# Patient Record
Sex: Male | Born: 1950 | Race: White | Hispanic: No | Marital: Married | Smoking: Former smoker
Health system: Southern US, Community
[De-identification: ages and names within clinical notes are randomized; demographics above are authoritative.]

## PROBLEM LIST (undated history)

## (undated) DIAGNOSIS — J189 Pneumonia, unspecified organism: Secondary | ICD-10-CM

## (undated) DIAGNOSIS — G8194 Hemiplegia, unspecified affecting left nondominant side: Secondary | ICD-10-CM

## (undated) DIAGNOSIS — F419 Anxiety disorder, unspecified: Secondary | ICD-10-CM

## (undated) DIAGNOSIS — F32A Depression, unspecified: Secondary | ICD-10-CM

## (undated) DIAGNOSIS — Z9989 Dependence on other enabling machines and devices: Secondary | ICD-10-CM

## (undated) DIAGNOSIS — K254 Chronic or unspecified gastric ulcer with hemorrhage: Secondary | ICD-10-CM

## (undated) DIAGNOSIS — K648 Other hemorrhoids: Secondary | ICD-10-CM

## (undated) DIAGNOSIS — E669 Obesity, unspecified: Secondary | ICD-10-CM

## (undated) DIAGNOSIS — K5790 Diverticulosis of intestine, part unspecified, without perforation or abscess without bleeding: Secondary | ICD-10-CM

## (undated) DIAGNOSIS — Z8679 Personal history of other diseases of the circulatory system: Secondary | ICD-10-CM

## (undated) DIAGNOSIS — E785 Hyperlipidemia, unspecified: Secondary | ICD-10-CM

## (undated) DIAGNOSIS — K219 Gastro-esophageal reflux disease without esophagitis: Secondary | ICD-10-CM

## (undated) DIAGNOSIS — R51 Headache: Secondary | ICD-10-CM

## (undated) DIAGNOSIS — I214 Non-ST elevation (NSTEMI) myocardial infarction: Secondary | ICD-10-CM

## (undated) DIAGNOSIS — I2510 Atherosclerotic heart disease of native coronary artery without angina pectoris: Secondary | ICD-10-CM

## (undated) DIAGNOSIS — D126 Benign neoplasm of colon, unspecified: Secondary | ICD-10-CM

## (undated) DIAGNOSIS — I10 Essential (primary) hypertension: Secondary | ICD-10-CM

## (undated) DIAGNOSIS — R519 Headache, unspecified: Secondary | ICD-10-CM

## (undated) DIAGNOSIS — I639 Cerebral infarction, unspecified: Secondary | ICD-10-CM

## (undated) DIAGNOSIS — F329 Major depressive disorder, single episode, unspecified: Secondary | ICD-10-CM

## (undated) DIAGNOSIS — I2699 Other pulmonary embolism without acute cor pulmonale: Secondary | ICD-10-CM

## (undated) DIAGNOSIS — C44719 Basal cell carcinoma of skin of left lower limb, including hip: Secondary | ICD-10-CM

## (undated) DIAGNOSIS — M109 Gout, unspecified: Secondary | ICD-10-CM

## (undated) DIAGNOSIS — M722 Plantar fascial fibromatosis: Secondary | ICD-10-CM

## (undated) DIAGNOSIS — G4733 Obstructive sleep apnea (adult) (pediatric): Secondary | ICD-10-CM

## (undated) DIAGNOSIS — Z8719 Personal history of other diseases of the digestive system: Secondary | ICD-10-CM

## (undated) DIAGNOSIS — E2740 Unspecified adrenocortical insufficiency: Secondary | ICD-10-CM

## (undated) HISTORY — DX: Other hemorrhoids: K64.8

## (undated) HISTORY — DX: Benign neoplasm of colon, unspecified: D12.6

## (undated) HISTORY — PX: CATARACT EXTRACTION: SUR2

## (undated) HISTORY — DX: Diverticulosis of intestine, part unspecified, without perforation or abscess without bleeding: K57.90

## (undated) HISTORY — PX: KNEE ARTHROSCOPY: SHX127

## (undated) HISTORY — DX: Personal history of other diseases of the circulatory system: Z86.79

## (undated) HISTORY — PX: HAND SURGERY: SHX662

## (undated) HISTORY — DX: Dependence on other enabling machines and devices: Z99.89

## (undated) HISTORY — DX: Obstructive sleep apnea (adult) (pediatric): G47.33

## (undated) HISTORY — PX: RETINAL DETACHMENT SURGERY: SHX105

## (undated) HISTORY — PX: CARDIAC SURGERY: SHX584

## (undated) HISTORY — PX: HERNIA REPAIR: SHX51

---

## 1997-07-26 ENCOUNTER — Ambulatory Visit (HOSPITAL_COMMUNITY): Admission: RE | Admit: 1997-07-26 | Discharge: 1997-07-26 | Payer: Self-pay | Admitting: Cardiology

## 1997-08-31 ENCOUNTER — Ambulatory Visit (HOSPITAL_COMMUNITY): Admission: RE | Admit: 1997-08-31 | Discharge: 1997-08-31 | Payer: Self-pay | Admitting: Cardiology

## 2000-11-27 ENCOUNTER — Emergency Department (HOSPITAL_COMMUNITY): Admission: EM | Admit: 2000-11-27 | Discharge: 2000-11-27 | Payer: Self-pay | Admitting: Emergency Medicine

## 2002-06-28 ENCOUNTER — Encounter: Payer: Self-pay | Admitting: Internal Medicine

## 2002-07-09 ENCOUNTER — Encounter: Payer: Self-pay | Admitting: Internal Medicine

## 2002-07-09 ENCOUNTER — Encounter: Admission: RE | Admit: 2002-07-09 | Discharge: 2002-07-09 | Payer: Self-pay | Admitting: Internal Medicine

## 2002-07-12 ENCOUNTER — Encounter: Admission: RE | Admit: 2002-07-12 | Discharge: 2002-10-10 | Payer: Self-pay | Admitting: Internal Medicine

## 2004-03-18 HISTORY — PX: UMBILICAL HERNIA REPAIR: SHX196

## 2004-05-01 ENCOUNTER — Encounter: Admission: RE | Admit: 2004-05-01 | Discharge: 2004-05-01 | Payer: Self-pay | Admitting: Surgery

## 2004-06-12 ENCOUNTER — Ambulatory Visit (HOSPITAL_BASED_OUTPATIENT_CLINIC_OR_DEPARTMENT_OTHER): Admission: RE | Admit: 2004-06-12 | Discharge: 2004-06-12 | Payer: Self-pay | Admitting: Surgery

## 2004-06-12 ENCOUNTER — Ambulatory Visit (HOSPITAL_COMMUNITY): Admission: RE | Admit: 2004-06-12 | Discharge: 2004-06-12 | Payer: Self-pay | Admitting: Surgery

## 2006-04-14 ENCOUNTER — Emergency Department (HOSPITAL_COMMUNITY): Admission: EM | Admit: 2006-04-14 | Discharge: 2006-04-14 | Payer: Self-pay | Admitting: Emergency Medicine

## 2007-11-30 ENCOUNTER — Inpatient Hospital Stay (HOSPITAL_COMMUNITY): Admission: EM | Admit: 2007-11-30 | Discharge: 2007-12-04 | Payer: Self-pay | Admitting: Emergency Medicine

## 2007-12-01 HISTORY — PX: CORONARY ANGIOPLASTY WITH STENT PLACEMENT: SHX49

## 2008-02-16 ENCOUNTER — Encounter: Admission: RE | Admit: 2008-02-16 | Discharge: 2008-02-16 | Payer: Self-pay | Admitting: Cardiology

## 2010-01-01 ENCOUNTER — Ambulatory Visit (HOSPITAL_COMMUNITY): Admission: RE | Admit: 2010-01-01 | Discharge: 2010-01-02 | Payer: Self-pay | Admitting: Cardiology

## 2010-02-20 ENCOUNTER — Encounter (HOSPITAL_COMMUNITY)
Admission: RE | Admit: 2010-02-20 | Discharge: 2010-04-17 | Payer: Self-pay | Source: Home / Self Care | Attending: Cardiology | Admitting: Cardiology

## 2010-05-30 LAB — URINALYSIS, MICROSCOPIC ONLY
Bilirubin Urine: NEGATIVE
Glucose, UA: NEGATIVE mg/dL
Hgb urine dipstick: NEGATIVE
Ketones, ur: NEGATIVE mg/dL
Leukocytes, UA: NEGATIVE
Nitrite: NEGATIVE
Protein, ur: NEGATIVE mg/dL
Specific Gravity, Urine: 1.023 (ref 1.005–1.030)
Urobilinogen, UA: 0.2 mg/dL (ref 0.0–1.0)
pH: 5.5 (ref 5.0–8.0)

## 2010-05-30 LAB — COMPREHENSIVE METABOLIC PANEL
ALT: 21 U/L (ref 0–53)
AST: 18 U/L (ref 0–37)
Albumin: 3.2 g/dL — ABNORMAL LOW (ref 3.5–5.2)
Alkaline Phosphatase: 72 U/L (ref 39–117)
BUN: 16 mg/dL (ref 6–23)
CO2: 28 mEq/L (ref 19–32)
Calcium: 8.8 mg/dL (ref 8.4–10.5)
Chloride: 106 mEq/L (ref 96–112)
Creatinine, Ser: 1.14 mg/dL (ref 0.4–1.5)
GFR calc Af Amer: >60 mL/min (ref >60)
GFR calc non Af Amer: >60 mL/min (ref >60)
Glucose, Bld: 116 mg/dL — ABNORMAL HIGH (ref 70–99)
Potassium: 4.7 mEq/L (ref 3.5–5.1)
Sodium: 138 mEq/L (ref 135–145)
Total Bilirubin: 0.4 mg/dL (ref 0.3–1.2)
Total Protein: 6.9 g/dL (ref 6.0–8.3)

## 2010-05-30 LAB — PROTIME-INR
INR: 0.99 (ref 0.00–1.49)
Prothrombin Time: 13.3 seconds (ref 11.6–15.2)

## 2010-05-30 LAB — TSH: TSH: 1.2 u[IU]/mL (ref 0.350–4.500)

## 2010-05-30 LAB — CBC
HCT: 41.1 % (ref 39.0–52.0)
HCT: 42.6 % (ref 39.0–52.0)
Hemoglobin: 13.3 g/dL (ref 13.0–17.0)
Hemoglobin: 14 g/dL (ref 13.0–17.0)
MCH: 28 pg (ref 26.0–34.0)
MCH: 28.1 pg (ref 26.0–34.0)
MCHC: 32.4 g/dL (ref 30.0–36.0)
MCHC: 32.9 g/dL (ref 30.0–36.0)
MCV: 85.4 fL (ref 78.0–100.0)
MCV: 86.5 fL (ref 78.0–100.0)
Platelets: 156 10*3/uL (ref 150–400)
Platelets: 191 10*3/uL (ref 150–400)
RBC: 4.75 MIL/uL (ref 4.22–5.81)
RBC: 4.99 MIL/uL (ref 4.22–5.81)
RDW: 13.8 % (ref 11.5–15.5)
RDW: 13.9 % (ref 11.5–15.5)
WBC: 7.1 10*3/uL (ref 4.0–10.5)
WBC: 7.6 10*3/uL (ref 4.0–10.5)

## 2010-05-30 LAB — BASIC METABOLIC PANEL
BUN: 12 mg/dL (ref 6–23)
CO2: 22 mEq/L (ref 19–32)
Calcium: 8.3 mg/dL — ABNORMAL LOW (ref 8.4–10.5)
Chloride: 109 mEq/L (ref 96–112)
Creatinine, Ser: 0.96 mg/dL (ref 0.4–1.5)
GFR calc Af Amer: >60 mL/min (ref >60)
GFR calc non Af Amer: >60 mL/min (ref >60)
Glucose, Bld: 102 mg/dL — ABNORMAL HIGH (ref 70–99)
Potassium: 4.2 mEq/L (ref 3.5–5.1)
Sodium: 138 mEq/L (ref 135–145)

## 2010-05-30 LAB — LIPID PANEL
Cholesterol: 134 mg/dL (ref 0–200)
HDL: 43 mg/dL (ref >39)
LDL Cholesterol: 81 mg/dL (ref 0–99)
Total CHOL/HDL Ratio: 3.1 RATIO
Triglycerides: 52 mg/dL (ref <150)
VLDL: 10 mg/dL (ref 0–40)

## 2010-06-07 ENCOUNTER — Encounter: Payer: Self-pay | Admitting: Gastroenterology

## 2010-06-14 NOTE — Letter (Signed)
Summary: Colonoscopy Letter  [facility]  [address] [place], [place] [address]   Phone: [phone]  Fax: [phone]      June 07, 2010 MRN: [id number]   Gregory Wiley [address] [place], [place]  [address]   Dear Mr. BUFANO,   According to your medical record, it is time for you to schedule a Colonoscopy. The American Cancer Society recommends this procedure as a method to detect early colon cancer. Patients with a family history of colon cancer, or a personal history of colon polyps or inflammatory bowel disease are at increased risk.  This letter has been generated based on the recommendations made at the time of your procedure. If you feel that in your particular situation this may no longer apply, please contact our office.  Please call our office at [phone] to schedule this appointment or to update your records at your earliest convenience.  Thank you for cooperating with [place] to provide you with the very best care possible.   Sincerely,  Judie Petit T. Russella Dar, M.D.  [facility] [phone]

## 2010-07-15 ENCOUNTER — Emergency Department (HOSPITAL_COMMUNITY): Payer: 59

## 2010-07-15 ENCOUNTER — Inpatient Hospital Stay (HOSPITAL_COMMUNITY)
Admission: EM | Admit: 2010-07-15 | Discharge: 2010-07-25 | DRG: 234 | Disposition: A | Discharge status: Home / Self Care | Payer: 59 | Attending: Surgery | Admitting: Surgery

## 2010-07-15 DIAGNOSIS — D62 Acute posthemorrhagic anemia: Secondary | ICD-10-CM | POA: Diagnosis not present

## 2010-07-15 DIAGNOSIS — Z87891 Personal history of nicotine dependence: Secondary | ICD-10-CM

## 2010-07-15 DIAGNOSIS — Z8249 Family history of ischemic heart disease and other diseases of the circulatory system: Secondary | ICD-10-CM

## 2010-07-15 DIAGNOSIS — G4733 Obstructive sleep apnea (adult) (pediatric): Secondary | ICD-10-CM | POA: Diagnosis present

## 2010-07-15 DIAGNOSIS — Z7982 Long term (current) use of aspirin: Secondary | ICD-10-CM

## 2010-07-15 DIAGNOSIS — I2589 Other forms of chronic ischemic heart disease: Secondary | ICD-10-CM | POA: Diagnosis present

## 2010-07-15 DIAGNOSIS — I10 Essential (primary) hypertension: Secondary | ICD-10-CM | POA: Diagnosis present

## 2010-07-15 DIAGNOSIS — Z79899 Other long term (current) drug therapy: Secondary | ICD-10-CM

## 2010-07-15 DIAGNOSIS — I2582 Chronic total occlusion of coronary artery: Secondary | ICD-10-CM | POA: Diagnosis present

## 2010-07-15 DIAGNOSIS — I2510 Atherosclerotic heart disease of native coronary artery without angina pectoris: Secondary | ICD-10-CM | POA: Diagnosis present

## 2010-07-15 DIAGNOSIS — D696 Thrombocytopenia, unspecified: Secondary | ICD-10-CM | POA: Diagnosis not present

## 2010-07-15 DIAGNOSIS — I9589 Other hypotension: Secondary | ICD-10-CM | POA: Diagnosis not present

## 2010-07-15 DIAGNOSIS — E669 Obesity, unspecified: Secondary | ICD-10-CM | POA: Diagnosis present

## 2010-07-15 DIAGNOSIS — I252 Old myocardial infarction: Secondary | ICD-10-CM

## 2010-07-15 DIAGNOSIS — Z9861 Coronary angioplasty status: Secondary | ICD-10-CM

## 2010-07-15 DIAGNOSIS — E785 Hyperlipidemia, unspecified: Secondary | ICD-10-CM | POA: Diagnosis present

## 2010-07-15 DIAGNOSIS — E8779 Other fluid overload: Secondary | ICD-10-CM | POA: Diagnosis not present

## 2010-07-15 DIAGNOSIS — I214 Non-ST elevation (NSTEMI) myocardial infarction: Principal | ICD-10-CM | POA: Diagnosis present

## 2010-07-15 LAB — DIFFERENTIAL
Basophils Absolute: 0 10*3/uL (ref 0.0–0.1)
Basophils Relative: 0 % (ref 0–1)
Eosinophils Absolute: 0.6 10*3/uL (ref 0.0–0.7)
Eosinophils Relative: 6 % — ABNORMAL HIGH (ref 0–5)
Lymphocytes Relative: 38 % (ref 12–46)
Lymphs Abs: 3.8 10*3/uL (ref 0.7–4.0)
Monocytes Absolute: 0.6 10*3/uL (ref 0.1–1.0)
Monocytes Relative: 6 % (ref 3–12)
Neutro Abs: 5 10*3/uL (ref 1.7–7.7)
Neutrophils Relative %: 50 % (ref 43–77)

## 2010-07-15 LAB — TSH: TSH: 0.912 u[IU]/mL (ref 0.350–4.500)

## 2010-07-15 LAB — CBC
HCT: 45.4 % (ref 39.0–52.0)
HCT: 42.1 % (ref 39.0–52.0)
Hemoglobin: 15.2 g/dL (ref 13.0–17.0)
Hemoglobin: 14.1 g/dL (ref 13.0–17.0)
MCH: 28.8 pg (ref 26.0–34.0)
MCH: 28.9 pg (ref 26.0–34.0)
MCHC: 33.5 g/dL (ref 30.0–36.0)
MCHC: 33.5 g/dL (ref 30.0–36.0)
MCV: 86.1 fL (ref 78.0–100.0)
MCV: 86.3 fL (ref 78.0–100.0)
Platelets: 252 10*3/uL (ref 150–400)
Platelets: 209 10*3/uL (ref 150–400)
RBC: 5.27 MIL/uL (ref 4.22–5.81)
RBC: 4.88 MIL/uL (ref 4.22–5.81)
RDW: 13.4 % (ref 11.5–15.5)
RDW: 13.4 % (ref 11.5–15.5)
WBC: 10 10*3/uL (ref 4.0–10.5)
WBC: 6.9 10*3/uL (ref 4.0–10.5)

## 2010-07-15 LAB — BASIC METABOLIC PANEL
BUN: 13 mg/dL (ref 6–23)
CO2: 26 mEq/L (ref 19–32)
Calcium: 8.6 mg/dL (ref 8.4–10.5)
Chloride: 101 mEq/L (ref 96–112)
Creatinine, Ser: 1.03 mg/dL (ref 0.4–1.5)
GFR calc Af Amer: >60 mL/min (ref >60)
GFR calc non Af Amer: >60 mL/min (ref >60)
Glucose, Bld: 151 mg/dL — ABNORMAL HIGH (ref 70–99)
Potassium: 3.9 mEq/L (ref 3.5–5.1)
Sodium: 135 mEq/L (ref 135–145)

## 2010-07-15 LAB — BRAIN NATRIURETIC PEPTIDE: Pro B Natriuretic peptide (BNP): 129 pg/mL — ABNORMAL HIGH (ref 0.0–100.0)

## 2010-07-15 LAB — HEPARIN LEVEL (UNFRACTIONATED)
Heparin Unfractionated: <0.1 IU/mL — ABNORMAL LOW (ref 0.30–0.70)
Heparin Unfractionated: <0.1 IU/mL — ABNORMAL LOW (ref 0.30–0.70)

## 2010-07-15 LAB — LIPID PANEL
Cholesterol: 165 mg/dL (ref 0–200)
HDL: 40 mg/dL (ref >39)
LDL Cholesterol: 111 mg/dL — ABNORMAL HIGH (ref 0–99)
Total CHOL/HDL Ratio: 4.1 RATIO
Triglycerides: 71 mg/dL (ref <150)
VLDL: 14 mg/dL (ref 0–40)

## 2010-07-15 LAB — CK TOTAL AND CKMB (NOT AT ARMC)
CK, MB: 4 ng/mL (ref 0.3–4.0)
Relative Index: INVALID (ref 0.0–2.5)
Total CK: 91 U/L (ref 7–232)

## 2010-07-15 LAB — CARDIAC PANEL(CRET KIN+CKTOT+MB+TROPI)
CK, MB: 8.6 ng/mL (ref 0.3–4.0)
CK, MB: 6.9 ng/mL (ref 0.3–4.0)
Relative Index: 8 — ABNORMAL HIGH (ref 0.0–2.5)
Relative Index: 6.8 — ABNORMAL HIGH (ref 0.0–2.5)
Total CK: 107 U/L (ref 7–232)
Total CK: 102 U/L (ref 7–232)
Troponin I: 1.11 ng/mL (ref 0.00–0.06)
Troponin I: 1.15 ng/mL (ref 0.00–0.06)

## 2010-07-15 LAB — MRSA PCR SCREENING: MRSA by PCR: NEGATIVE

## 2010-07-15 LAB — HEMOGLOBIN A1C
Hgb A1c MFr Bld: 6.1 % — ABNORMAL HIGH (ref <5.7)
Mean Plasma Glucose: 128 mg/dL — ABNORMAL HIGH (ref <117)

## 2010-07-15 LAB — TROPONIN I: Troponin I: 0.5 ng/mL (ref 0.00–0.06)

## 2010-07-15 LAB — MAGNESIUM: Magnesium: 2.1 mg/dL (ref 1.5–2.5)

## 2010-07-16 DIAGNOSIS — Z01810 Encounter for preprocedural cardiovascular examination: Secondary | ICD-10-CM

## 2010-07-16 DIAGNOSIS — I2510 Atherosclerotic heart disease of native coronary artery without angina pectoris: Secondary | ICD-10-CM

## 2010-07-16 LAB — CBC
HCT: 43.6 % (ref 39.0–52.0)
Hemoglobin: 14.5 g/dL (ref 13.0–17.0)
MCH: 28.5 pg (ref 26.0–34.0)
MCHC: 33.3 g/dL (ref 30.0–36.0)
MCV: 85.8 fL (ref 78.0–100.0)
Platelets: 185 10*3/uL (ref 150–400)
RBC: 5.08 MIL/uL (ref 4.22–5.81)
RDW: 13.5 % (ref 11.5–15.5)
WBC: 7.9 10*3/uL (ref 4.0–10.5)

## 2010-07-16 LAB — BASIC METABOLIC PANEL
BUN: 11 mg/dL (ref 6–23)
CO2: 24 mEq/L (ref 19–32)
Calcium: 8.4 mg/dL (ref 8.4–10.5)
Chloride: 106 mEq/L (ref 96–112)
Creatinine, Ser: 0.96 mg/dL (ref 0.4–1.5)
GFR calc Af Amer: >60 mL/min (ref >60)
GFR calc non Af Amer: >60 mL/min (ref >60)
Glucose, Bld: 98 mg/dL (ref 70–99)
Potassium: 4 mEq/L (ref 3.5–5.1)
Sodium: 137 mEq/L (ref 135–145)

## 2010-07-16 LAB — POCT ACTIVATED CLOTTING TIME: Activated Clotting Time: 105 seconds

## 2010-07-16 LAB — HEPARIN LEVEL (UNFRACTIONATED)
Heparin Unfractionated: 0.32 IU/mL (ref 0.30–0.70)
Heparin Unfractionated: 0.23 IU/mL — ABNORMAL LOW (ref 0.30–0.70)

## 2010-07-16 LAB — PROTIME-INR
INR: 1.05 (ref 0.00–1.49)
Prothrombin Time: 13.9 seconds (ref 11.6–15.2)

## 2010-07-17 LAB — CBC
HCT: 42 % (ref 39.0–52.0)
Hemoglobin: 14.7 g/dL (ref 13.0–17.0)
MCH: 30.1 pg (ref 26.0–34.0)
MCHC: 35 g/dL (ref 30.0–36.0)
MCV: 85.9 fL (ref 78.0–100.0)
Platelets: 182 10*3/uL (ref 150–400)
RBC: 4.89 MIL/uL (ref 4.22–5.81)
RDW: 13.4 % (ref 11.5–15.5)
WBC: 8.5 10*3/uL (ref 4.0–10.5)

## 2010-07-17 LAB — BASIC METABOLIC PANEL
BUN: 13 mg/dL (ref 6–23)
CO2: 23 mEq/L (ref 19–32)
Calcium: 8.5 mg/dL (ref 8.4–10.5)
Chloride: 106 mEq/L (ref 96–112)
Creatinine, Ser: 0.94 mg/dL (ref 0.4–1.5)
GFR calc Af Amer: >60 mL/min (ref >60)
GFR calc non Af Amer: >60 mL/min (ref >60)
Glucose, Bld: 101 mg/dL — ABNORMAL HIGH (ref 70–99)
Potassium: 4.1 mEq/L (ref 3.5–5.1)
Sodium: 136 mEq/L (ref 135–145)

## 2010-07-17 LAB — PLATELET INHIBITION P2Y12
P2Y12 % Inhibition: 31 %
Platelet Function  P2Y12: 157 [PRU] — ABNORMAL LOW (ref 194–418)
Platelet Function Baseline: 226 [PRU] (ref 194–418)

## 2010-07-17 LAB — HEPARIN LEVEL (UNFRACTIONATED): Heparin Unfractionated: 0.47 IU/mL (ref 0.30–0.70)

## 2010-07-17 NOTE — Consult Note (Signed)
NAME:  Gregory Wiley, Gregory Wiley NO.:  [id number]  MEDICAL RECORD NO.:  [id number]           PATIENT TYPE:  I  LOCATION:  3311                         FACILITY:  [facility]  PHYSICIAN:  Evelene Croon, M.D.     DATE OF BIRTH:  1950/03/22  DATE OF CONSULTATION:  07/16/2010 DATE OF DISCHARGE:                                CONSULTATION   REFERRING PHYSICIAN:  Thereasa Solo. Little, MD  REASON FOR CONSULTATION:  Severe two-vessel coronary artery disease status post non-ST-segment elevation MI.  CLINICAL HISTORY:  I was asked by Dr. Clarene Duke to evaluate Mr. Athey for consideration of coronary artery bypass graft surgery.  He is a 60 year old obese gentleman with a history of hypertension, dyslipidemia, and obstructive sleep apnea, as well as history of coronary artery disease status post stenting of his right coronary artery in November 2009.  He has further stenting in the right coronary system in October 2011.  He has a no known chronic total occlusion of the proximal LAD almost at the ostium with collaterals to the mid and distal LAD from the left circumflex and right coronary arteries.  He presented with a 1-week history of exertional substernal chest discomfort radiating into the right arm relieved with rest.  Last Saturday he developed chest pain that awoke him from sleep and radiated to the right arm.  On arrival in the emergency department, electrocardiogram showed sinus tachycardia without acute ischemic changes.  His initial troponin was elevated at 0.5 with a normal CPK and MB.  He was started on heparin.  He had subsequent elevation of his troponin to 1.15 and his CPK peak was 102 with an MB of 6.  He did have some subsequent electrocardiogram changes showing transient intraventricular conduction delay suggesting extensive ischemia.  He underwent cardiac catheterization earlier today which showed severe two-vessel coronary artery disease.  The left main, the left  circumflex had no disease.  The LAD was occluded almost at its ostium with left-to-left and right-to-left collaterals just climbing mid and apical LAD.  The right coronary artery had multiple stents in proximal, mid, and distal vessel with a napkin ring lesion just at the terminal aspects of the stent at the bifurcation of posterior descending and the ongoing right coronary artery.  This supplied posterolateral. The left mammary artery was patent.  Left ventricular ejection fraction was estimated at 25% with slight dilatation of left ventricle and anterior and apical akinesis with inferior wall hypokinesis.  End- diastolic pressure was 14.  There was no gradient across the aortic valve.  REVIEW OF SYSTEMS:  GENERAL:  He denies any fever or chills.  He has had no recent weight changes.  He does report fatigue over the past few years.  EYES:  Negative.  ENT:  Negative. ENDOCRINE:  He denies diabetes and hypothyroidism.  CARDIOVASCULAR:  As above.  He denies exertional dyspnea, PND and orthopnea.  He denies palpitations and peripheral edema.  RESPIRATORY:  Denies cough and sputum production.  He does have a history of sleep apnea but has not used CPAP due to financial issues preventing him from buying a  CPAP machine.  GI:  He denies nausea or vomiting.  He denies melena and bright red blood per rectum.  GU:  He denies dysuria and hematuria.  MUSCULOSKELETAL:  He denies arthralgias and myalgias.  NEUROLOGICAL:  He denies any focal weakness or numbness. He denies dizziness and syncope.  He has never had a TIA or stroke. ALLERGIES:  BENADRYL.  PSYCHIATRIC:  Negative.  HEMATOLOGICAL: Negative.  PAST MEDICAL HISTORY:  Significant for coronary artery disease as mentioned above.  He underwent cutting balloon atherectomy and placement of four stents in the right coronary artery in November 2009.  He also had PTCA of the posterior descending artery at that time.  On January 02, 2010, he had a  drug-eluting stent placed in the right coronary artery.  This was performed for a 95% in-stent restenosis in the mid right coronary artery.  He has a history of hypertension and dyslipidemia.  He has a history of obstructive sleep apnea as mentioned above.  PAST SURGICAL HISTORY:  Arthroscopy on his right knee and removal of some glass from his right hand in the past.  FAMILY HISTORY:  Positive for cardiac disease.  His father had coronary bypass in his 33s.  SOCIAL HISTORY:  He is married and lives with wife.  He has 5 children. He retired and then has been working for [employer].  He quit smoking over 40 years ago.  Denies alcohol abuse.  MEDICATIONS PRIOR TO ADMISSION:  Plavix, aspirin, and Zocor.  PHYSICAL EXAMINATION:  VITAL SIGNS:  His blood pressure is 125/60, pulse is 90 and regular, respiratory rate is 16 and unlabored. GENERAL:  He is an obese white male in no distress. HEENT:  Normocephalic and atraumatic.  Pupils are equal and reactive to light and accommodation.  Extraocular muscles are intact.  Throat is clear. NECK:  Normal carotid pulses bilaterally.  There are no bruits.  There is no adenopathy or thyromegaly. CARDIAC:  Regular rate and rhythm with normal S1-S2.  There is no murmur, rub or gallop. LUNGS:  Clear. ABDOMEN:  Active bowel sounds.  Abdomen is soft, obese and nontender. There are no palpable masses or organomegaly. EXTREMITY:  No peripheral edema.  Pedal pulses are palpable bilaterally. SKIN:  Warm and dry. NEUROLOGIC:  Alert and oriented x3.  Motor and sensory exams are grossly normal.  LABORATORY DATA:  Hemoglobin on admission was 15.2, platelet count 252,000.  Electrolytes are normal with a BUN of 13, creatinine of 1.03.  IMPRESSION:  Mr. Semper has severe two-vessel coronary artery disease presenting with angina at rest and non-ST-segment elevation MI.  He has had significant decline in his ventricular function  compared to previous catheterizations with known chronic total occlusion of the left anterior descending (coronary) artery.  I suspect this decreased ventricular function is due to compromise of inferior wall perfusion as well as anterior wall perfusion given his dependence on collaterals from the right coronary artery.  I think his best long-term prognosis will be with coronary artery bypass graft surgery.  Hopefully, he has remaining hibernating myocardium anteriorly that will be improved with revascularization.  He has been on Plavix and this has been discontinued.  We will check a P2Y12 platelet inhibition level to decide on the timing of surgery.  I discussed the operative procedure of coronary artery bypass surgery with he and his family.  We discussed alternatives, benefits, and risks including but not limited to bleeding, blood transfusion, infection, stroke, myocardial infarction, graft failure, and death.  Also discussed the possibility that his cardiac function may not improve with revascularization.  He understands all this and agrees to proceed.     Evelene Croon, M.D.     BB/MEDQ  D:  07/16/2010  T:  07/17/2010  Job:  [id number]  cc:   Thereasa Solo. Little, M.D.  Electronically Signed by Evelene Croon M.D. on 07/17/2010 11:27:18 AM

## 2010-07-18 LAB — BASIC METABOLIC PANEL
BUN: 17 mg/dL (ref 6–23)
CO2: 24 mEq/L (ref 19–32)
Calcium: 9 mg/dL (ref 8.4–10.5)
Chloride: 102 mEq/L (ref 96–112)
Creatinine, Ser: 1.06 mg/dL (ref 0.4–1.5)
GFR calc Af Amer: >60 mL/min (ref >60)
GFR calc non Af Amer: >60 mL/min (ref >60)
Glucose, Bld: 105 mg/dL — ABNORMAL HIGH (ref 70–99)
Potassium: 4 mEq/L (ref 3.5–5.1)
Sodium: 136 mEq/L (ref 135–145)

## 2010-07-18 LAB — CBC
HCT: 43.8 % (ref 39.0–52.0)
Hemoglobin: 15.1 g/dL (ref 13.0–17.0)
MCH: 29.3 pg (ref 26.0–34.0)
MCHC: 34.5 g/dL (ref 30.0–36.0)
MCV: 85 fL (ref 78.0–100.0)
Platelets: 182 10*3/uL (ref 150–400)
RBC: 5.15 MIL/uL (ref 4.22–5.81)
RDW: 13.3 % (ref 11.5–15.5)
WBC: 9.2 10*3/uL (ref 4.0–10.5)

## 2010-07-18 LAB — HEPARIN LEVEL (UNFRACTIONATED): Heparin Unfractionated: 0.68 IU/mL (ref 0.30–0.70)

## 2010-07-19 LAB — TYPE AND SCREEN
ABO/RH(D): O POS
Antibody Screen: NEGATIVE

## 2010-07-19 LAB — CBC
HCT: 41.8 % (ref 39.0–52.0)
Hemoglobin: 14 g/dL (ref 13.0–17.0)
MCH: 28.5 pg (ref 26.0–34.0)
MCHC: 33.5 g/dL (ref 30.0–36.0)
MCV: 85.1 fL (ref 78.0–100.0)
Platelets: 181 10*3/uL (ref 150–400)
RBC: 4.91 MIL/uL (ref 4.22–5.81)
RDW: 13.4 % (ref 11.5–15.5)
WBC: 10 10*3/uL (ref 4.0–10.5)

## 2010-07-19 LAB — ABO/RH: ABO/RH(D): O POS

## 2010-07-19 LAB — HEPARIN LEVEL (UNFRACTIONATED)
Heparin Unfractionated: 0.83 IU/mL — ABNORMAL HIGH (ref 0.30–0.70)
Heparin Unfractionated: 0.85 IU/mL — ABNORMAL HIGH (ref 0.30–0.70)

## 2010-07-20 ENCOUNTER — Inpatient Hospital Stay (HOSPITAL_COMMUNITY): Payer: 59

## 2010-07-20 DIAGNOSIS — I2510 Atherosclerotic heart disease of native coronary artery without angina pectoris: Secondary | ICD-10-CM

## 2010-07-20 HISTORY — PX: CORONARY ARTERY BYPASS GRAFT: SHX141

## 2010-07-20 LAB — POCT I-STAT 4, (NA,K, GLUC, HGB,HCT)
Glucose, Bld: 102 mg/dL — ABNORMAL HIGH (ref 70–99)
Glucose, Bld: 101 mg/dL — ABNORMAL HIGH (ref 70–99)
Glucose, Bld: 109 mg/dL — ABNORMAL HIGH (ref 70–99)
Glucose, Bld: 123 mg/dL — ABNORMAL HIGH (ref 70–99)
Glucose, Bld: 152 mg/dL — ABNORMAL HIGH (ref 70–99)
Glucose, Bld: 116 mg/dL — ABNORMAL HIGH (ref 70–99)
HCT: 41 % (ref 39.0–52.0)
HCT: 32 % — ABNORMAL LOW (ref 39.0–52.0)
HCT: 38 % — ABNORMAL LOW (ref 39.0–52.0)
HCT: 35 % — ABNORMAL LOW (ref 39.0–52.0)
HCT: 33 % — ABNORMAL LOW (ref 39.0–52.0)
HCT: 36 % — ABNORMAL LOW (ref 39.0–52.0)
Hemoglobin: 13.9 g/dL (ref 13.0–17.0)
Hemoglobin: 10.9 g/dL — ABNORMAL LOW (ref 13.0–17.0)
Hemoglobin: 12.9 g/dL — ABNORMAL LOW (ref 13.0–17.0)
Hemoglobin: 11.9 g/dL — ABNORMAL LOW (ref 13.0–17.0)
Hemoglobin: 11.2 g/dL — ABNORMAL LOW (ref 13.0–17.0)
Hemoglobin: 12.2 g/dL — ABNORMAL LOW (ref 13.0–17.0)
Potassium: 4.3 mEq/L (ref 3.5–5.1)
Potassium: 4.3 mEq/L (ref 3.5–5.1)
Potassium: 4.3 mEq/L (ref 3.5–5.1)
Potassium: 4.8 mEq/L (ref 3.5–5.1)
Potassium: 4.3 mEq/L (ref 3.5–5.1)
Potassium: 4.3 mEq/L (ref 3.5–5.1)
Sodium: 138 mEq/L (ref 135–145)
Sodium: 137 mEq/L (ref 135–145)
Sodium: 137 mEq/L (ref 135–145)
Sodium: 135 mEq/L (ref 135–145)
Sodium: 136 mEq/L (ref 135–145)
Sodium: 138 mEq/L (ref 135–145)

## 2010-07-20 LAB — CBC
HCT: 42.6 % (ref 39.0–52.0)
HCT: 35.6 % — ABNORMAL LOW (ref 39.0–52.0)
HCT: 31.8 % — ABNORMAL LOW (ref 39.0–52.0)
Hemoglobin: 14.1 g/dL (ref 13.0–17.0)
Hemoglobin: 11.9 g/dL — ABNORMAL LOW (ref 13.0–17.0)
Hemoglobin: 10.8 g/dL — ABNORMAL LOW (ref 13.0–17.0)
MCH: 27.9 pg (ref 26.0–34.0)
MCH: 28.4 pg (ref 26.0–34.0)
MCH: 28.8 pg (ref 26.0–34.0)
MCHC: 33.1 g/dL (ref 30.0–36.0)
MCHC: 33.4 g/dL (ref 30.0–36.0)
MCHC: 34 g/dL (ref 30.0–36.0)
MCV: 84.4 fL (ref 78.0–100.0)
MCV: 85 fL (ref 78.0–100.0)
MCV: 84.8 fL (ref 78.0–100.0)
Platelets: 193 10*3/uL (ref 150–400)
Platelets: 178 10*3/uL (ref 150–400)
Platelets: 171 10*3/uL (ref 150–400)
RBC: 5.05 MIL/uL (ref 4.22–5.81)
RBC: 4.19 MIL/uL — ABNORMAL LOW (ref 4.22–5.81)
RBC: 3.75 MIL/uL — ABNORMAL LOW (ref 4.22–5.81)
RDW: 13.4 % (ref 11.5–15.5)
RDW: 13.3 % (ref 11.5–15.5)
RDW: 13.3 % (ref 11.5–15.5)
WBC: 9.2 10*3/uL (ref 4.0–10.5)
WBC: 16.2 10*3/uL — ABNORMAL HIGH (ref 4.0–10.5)
WBC: 14.4 10*3/uL — ABNORMAL HIGH (ref 4.0–10.5)

## 2010-07-20 LAB — POCT I-STAT 3, ART BLOOD GAS (G3+)
Acid-Base Excess: 1 mmol/L (ref 0.0–2.0)
Acid-Base Excess: 1 mmol/L (ref 0.0–2.0)
Acid-base deficit: 3 mmol/L — ABNORMAL HIGH (ref 0.0–2.0)
Acid-base deficit: 3 mmol/L — ABNORMAL HIGH (ref 0.0–2.0)
Acid-base deficit: 3 mmol/L — ABNORMAL HIGH (ref 0.0–2.0)
Bicarbonate: 26.3 mEq/L — ABNORMAL HIGH (ref 20.0–24.0)
Bicarbonate: 25.1 mEq/L — ABNORMAL HIGH (ref 20.0–24.0)
Bicarbonate: 25.6 mEq/L — ABNORMAL HIGH (ref 20.0–24.0)
Bicarbonate: 24 mEq/L (ref 20.0–24.0)
Bicarbonate: 22.6 mEq/L (ref 20.0–24.0)
Bicarbonate: 22.1 mEq/L (ref 20.0–24.0)
O2 Saturation: 100 %
O2 Saturation: 100 %
O2 Saturation: 99 %
O2 Saturation: 91 %
O2 Saturation: 95 %
O2 Saturation: 91 %
Patient temperature: 36.6
Patient temperature: 36.9
Patient temperature: 36.8
TCO2: 28 mmol/L (ref 0–100)
TCO2: 26 mmol/L (ref 0–100)
TCO2: 27 mmol/L (ref 0–100)
TCO2: 25 mmol/L (ref 0–100)
TCO2: 24 mmol/L (ref 0–100)
TCO2: 23 mmol/L (ref 0–100)
pCO2 arterial: 49.1 mmHg — ABNORMAL HIGH (ref 35.0–45.0)
pCO2 arterial: 37 mmHg (ref 35.0–45.0)
pCO2 arterial: 41.3 mmHg (ref 35.0–45.0)
pCO2 arterial: 49 mmHg — ABNORMAL HIGH (ref 35.0–45.0)
pCO2 arterial: 40.7 mmHg (ref 35.0–45.0)
pCO2 arterial: 38.5 mmHg (ref 35.0–45.0)
pH, Arterial: 7.337 — ABNORMAL LOW (ref 7.350–7.450)
pH, Arterial: 7.44 (ref 7.350–7.450)
pH, Arterial: 7.401 (ref 7.350–7.450)
pH, Arterial: 7.296 — ABNORMAL LOW (ref 7.350–7.450)
pH, Arterial: 7.352 (ref 7.350–7.450)
pH, Arterial: 7.366 (ref 7.350–7.450)
pO2, Arterial: 329 mmHg — ABNORMAL HIGH (ref 80.0–100.0)
pO2, Arterial: 479 mmHg — ABNORMAL HIGH (ref 80.0–100.0)
pO2, Arterial: 163 mmHg — ABNORMAL HIGH (ref 80.0–100.0)
pO2, Arterial: 68 mmHg — ABNORMAL LOW (ref 80.0–100.0)
pO2, Arterial: 78 mmHg — ABNORMAL LOW (ref 80.0–100.0)
pO2, Arterial: 63 mmHg — ABNORMAL LOW (ref 80.0–100.0)

## 2010-07-20 LAB — GLUCOSE, CAPILLARY
Glucose-Capillary: 100 mg/dL — ABNORMAL HIGH (ref 70–99)
Glucose-Capillary: 100 mg/dL — ABNORMAL HIGH (ref 70–99)
Glucose-Capillary: 104 mg/dL — ABNORMAL HIGH (ref 70–99)
Glucose-Capillary: 104 mg/dL — ABNORMAL HIGH (ref 70–99)
Glucose-Capillary: 106 mg/dL — ABNORMAL HIGH (ref 70–99)
Glucose-Capillary: 93 mg/dL (ref 70–99)
Glucose-Capillary: 102 mg/dL — ABNORMAL HIGH (ref 70–99)

## 2010-07-20 LAB — POCT I-STAT, CHEM 8
BUN: 13 mg/dL (ref 6–23)
Calcium, Ion: 1.3 mmol/L (ref 1.12–1.32)
Chloride: 107 mEq/L (ref 96–112)
Creatinine, Ser: 1.1 mg/dL (ref 0.4–1.5)
Glucose, Bld: 91 mg/dL (ref 70–99)
HCT: 30 % — ABNORMAL LOW (ref 39.0–52.0)
Hemoglobin: 10.2 g/dL — ABNORMAL LOW (ref 13.0–17.0)
Potassium: 4.4 mEq/L (ref 3.5–5.1)
Sodium: 137 mEq/L (ref 135–145)
TCO2: 22 mmol/L (ref 0–100)

## 2010-07-20 LAB — BASIC METABOLIC PANEL
BUN: 14 mg/dL (ref 6–23)
CO2: 25 mEq/L (ref 19–32)
Calcium: 9.2 mg/dL (ref 8.4–10.5)
Chloride: 101 mEq/L (ref 96–112)
Creatinine, Ser: 1.15 mg/dL (ref 0.4–1.5)
GFR calc Af Amer: >60 mL/min (ref >60)
GFR calc non Af Amer: >60 mL/min (ref >60)
Glucose, Bld: 98 mg/dL (ref 70–99)
Potassium: 3.9 mEq/L (ref 3.5–5.1)
Sodium: 136 mEq/L (ref 135–145)

## 2010-07-20 LAB — BLOOD GAS, ARTERIAL
Acid-base deficit: 0.3 mmol/L (ref 0.0–2.0)
Bicarbonate: 23.7 mEq/L (ref 20.0–24.0)
Drawn by: 330991
FIO2: 0.21 %
O2 Saturation: 93.8 %
Patient temperature: 98.6
TCO2: 24.8 mmol/L (ref 0–100)
pCO2 arterial: 37.8 mmHg (ref 35.0–45.0)
pH, Arterial: 7.413 (ref 7.350–7.450)
pO2, Arterial: 66.7 mmHg — ABNORMAL LOW (ref 80.0–100.0)

## 2010-07-20 LAB — CREATININE, SERUM
Creatinine, Ser: 0.86 mg/dL (ref 0.4–1.5)
GFR calc Af Amer: >60 mL/min (ref >60)
GFR calc non Af Amer: >60 mL/min (ref >60)

## 2010-07-20 LAB — HEMOGLOBIN A1C
Hgb A1c MFr Bld: 6 % — ABNORMAL HIGH (ref <5.7)
Mean Plasma Glucose: 126 mg/dL — ABNORMAL HIGH (ref <117)

## 2010-07-20 LAB — PROTIME-INR
INR: 1.24 (ref 0.00–1.49)
Prothrombin Time: 15.8 seconds — ABNORMAL HIGH (ref 11.6–15.2)

## 2010-07-20 LAB — APTT: aPTT: 50 seconds — ABNORMAL HIGH (ref 24–37)

## 2010-07-20 LAB — PLATELET COUNT: Platelets: 187 10*3/uL (ref 150–400)

## 2010-07-20 LAB — LIPID PANEL
Cholesterol: 137 mg/dL (ref 0–200)
HDL: 53 mg/dL (ref >39)
LDL Cholesterol: 69 mg/dL (ref 0–99)
Total CHOL/HDL Ratio: 2.6 RATIO
Triglycerides: 77 mg/dL (ref <150)
VLDL: 15 mg/dL (ref 0–40)

## 2010-07-20 LAB — HEMOGLOBIN AND HEMATOCRIT, BLOOD
HCT: 34.3 % — ABNORMAL LOW (ref 39.0–52.0)
Hemoglobin: 11.5 g/dL — ABNORMAL LOW (ref 13.0–17.0)

## 2010-07-20 LAB — HEPARIN LEVEL (UNFRACTIONATED): Heparin Unfractionated: 0.41 IU/mL (ref 0.30–0.70)

## 2010-07-20 LAB — MAGNESIUM: Magnesium: 2.9 mg/dL — ABNORMAL HIGH (ref 1.5–2.5)

## 2010-07-21 ENCOUNTER — Inpatient Hospital Stay (HOSPITAL_COMMUNITY): Payer: 59

## 2010-07-21 DIAGNOSIS — IMO0001 Reserved for inherently not codable concepts without codable children: Secondary | ICD-10-CM

## 2010-07-21 DIAGNOSIS — E1165 Type 2 diabetes mellitus with hyperglycemia: Secondary | ICD-10-CM

## 2010-07-21 LAB — MAGNESIUM
Magnesium: 2.5 mg/dL (ref 1.5–2.5)
Magnesium: 2.4 mg/dL (ref 1.5–2.5)

## 2010-07-21 LAB — POCT I-STAT, CHEM 8
BUN: 21 mg/dL (ref 6–23)
Calcium, Ion: 1.2 mmol/L (ref 1.12–1.32)
Chloride: 103 mEq/L (ref 96–112)
Creatinine, Ser: 1.2 mg/dL (ref 0.4–1.5)
Glucose, Bld: 135 mg/dL — ABNORMAL HIGH (ref 70–99)
HCT: 28 % — ABNORMAL LOW (ref 39.0–52.0)
Hemoglobin: 9.5 g/dL — ABNORMAL LOW (ref 13.0–17.0)
Potassium: 4.5 mEq/L (ref 3.5–5.1)
Sodium: 136 mEq/L (ref 135–145)
TCO2: 24 mmol/L (ref 0–100)

## 2010-07-21 LAB — CBC
HCT: 30.9 % — ABNORMAL LOW (ref 39.0–52.0)
HCT: 28.9 % — ABNORMAL LOW (ref 39.0–52.0)
Hemoglobin: 10.1 g/dL — ABNORMAL LOW (ref 13.0–17.0)
Hemoglobin: 9.6 g/dL — ABNORMAL LOW (ref 13.0–17.0)
MCH: 27.7 pg (ref 26.0–34.0)
MCH: 28.7 pg (ref 26.0–34.0)
MCHC: 32.7 g/dL (ref 30.0–36.0)
MCHC: 33.2 g/dL (ref 30.0–36.0)
MCV: 84.9 fL (ref 78.0–100.0)
MCV: 86.3 fL (ref 78.0–100.0)
Platelets: 151 10*3/uL (ref 150–400)
Platelets: 130 10*3/uL — ABNORMAL LOW (ref 150–400)
RBC: 3.64 MIL/uL — ABNORMAL LOW (ref 4.22–5.81)
RBC: 3.35 MIL/uL — ABNORMAL LOW (ref 4.22–5.81)
RDW: 13.5 % (ref 11.5–15.5)
RDW: 13.9 % (ref 11.5–15.5)
WBC: 13.5 10*3/uL — ABNORMAL HIGH (ref 4.0–10.5)
WBC: 13.4 10*3/uL — ABNORMAL HIGH (ref 4.0–10.5)

## 2010-07-21 LAB — GLUCOSE, CAPILLARY
Glucose-Capillary: 115 mg/dL — ABNORMAL HIGH (ref 70–99)
Glucose-Capillary: 86 mg/dL (ref 70–99)
Glucose-Capillary: 98 mg/dL (ref 70–99)
Glucose-Capillary: 98 mg/dL (ref 70–99)
Glucose-Capillary: 120 mg/dL — ABNORMAL HIGH (ref 70–99)
Glucose-Capillary: 132 mg/dL — ABNORMAL HIGH (ref 70–99)
Glucose-Capillary: 126 mg/dL — ABNORMAL HIGH (ref 70–99)
Glucose-Capillary: 128 mg/dL — ABNORMAL HIGH (ref 70–99)
Glucose-Capillary: 112 mg/dL — ABNORMAL HIGH (ref 70–99)

## 2010-07-21 LAB — BASIC METABOLIC PANEL
BUN: 15 mg/dL (ref 6–23)
CO2: 22 mEq/L (ref 19–32)
Calcium: 8.5 mg/dL (ref 8.4–10.5)
Chloride: 104 mEq/L (ref 96–112)
Creatinine, Ser: 0.93 mg/dL (ref 0.4–1.5)
GFR calc Af Amer: >60 mL/min (ref >60)
GFR calc non Af Amer: >60 mL/min (ref >60)
Glucose, Bld: 112 mg/dL — ABNORMAL HIGH (ref 70–99)
Potassium: 4.9 mEq/L (ref 3.5–5.1)
Sodium: 135 mEq/L (ref 135–145)

## 2010-07-21 LAB — CREATININE, SERUM
Creatinine, Ser: 1.04 mg/dL (ref 0.4–1.5)
GFR calc Af Amer: >60 mL/min (ref >60)
GFR calc non Af Amer: >60 mL/min (ref >60)

## 2010-07-22 ENCOUNTER — Inpatient Hospital Stay (HOSPITAL_COMMUNITY): Payer: 59

## 2010-07-22 LAB — BASIC METABOLIC PANEL
BUN: 18 mg/dL (ref 6–23)
CO2: 26 mEq/L (ref 19–32)
Calcium: 8.5 mg/dL (ref 8.4–10.5)
Chloride: 99 mEq/L (ref 96–112)
Creatinine, Ser: 1.05 mg/dL (ref 0.4–1.5)
GFR calc Af Amer: >60 mL/min (ref >60)
GFR calc non Af Amer: >60 mL/min (ref >60)
Glucose, Bld: 117 mg/dL — ABNORMAL HIGH (ref 70–99)
Potassium: 4 mEq/L (ref 3.5–5.1)
Sodium: 132 mEq/L — ABNORMAL LOW (ref 135–145)

## 2010-07-22 LAB — GLUCOSE, CAPILLARY
Glucose-Capillary: 113 mg/dL — ABNORMAL HIGH (ref 70–99)
Glucose-Capillary: 118 mg/dL — ABNORMAL HIGH (ref 70–99)
Glucose-Capillary: 125 mg/dL — ABNORMAL HIGH (ref 70–99)
Glucose-Capillary: 129 mg/dL — ABNORMAL HIGH (ref 70–99)
Glucose-Capillary: 135 mg/dL — ABNORMAL HIGH (ref 70–99)

## 2010-07-22 LAB — CBC
HCT: 28.4 % — ABNORMAL LOW (ref 39.0–52.0)
Hemoglobin: 9.4 g/dL — ABNORMAL LOW (ref 13.0–17.0)
MCH: 28.6 pg (ref 26.0–34.0)
MCHC: 33.1 g/dL (ref 30.0–36.0)
MCV: 86.3 fL (ref 78.0–100.0)
Platelets: 131 10*3/uL — ABNORMAL LOW (ref 150–400)
RBC: 3.29 MIL/uL — ABNORMAL LOW (ref 4.22–5.81)
RDW: 13.8 % (ref 11.5–15.5)
WBC: 14.7 10*3/uL — ABNORMAL HIGH (ref 4.0–10.5)

## 2010-07-23 ENCOUNTER — Inpatient Hospital Stay (HOSPITAL_COMMUNITY): Payer: 59

## 2010-07-23 LAB — URINALYSIS, ROUTINE W REFLEX MICROSCOPIC
Bilirubin Urine: NEGATIVE
Glucose, UA: NEGATIVE mg/dL
Ketones, ur: 15 mg/dL — AB
Leukocytes, UA: NEGATIVE
Nitrite: NEGATIVE
Protein, ur: NEGATIVE mg/dL
Specific Gravity, Urine: 1.015 (ref 1.005–1.030)
Urobilinogen, UA: 1 mg/dL (ref 0.0–1.0)
pH: 6 (ref 5.0–8.0)

## 2010-07-23 LAB — BASIC METABOLIC PANEL
BUN: 16 mg/dL (ref 6–23)
CO2: 30 mEq/L (ref 19–32)
Calcium: 8.7 mg/dL (ref 8.4–10.5)
Chloride: 101 mEq/L (ref 96–112)
Creatinine, Ser: 0.89 mg/dL (ref 0.4–1.5)
GFR calc Af Amer: >60 mL/min (ref >60)
GFR calc non Af Amer: >60 mL/min (ref >60)
Glucose, Bld: 104 mg/dL — ABNORMAL HIGH (ref 70–99)
Potassium: 3.8 mEq/L (ref 3.5–5.1)
Sodium: 136 mEq/L (ref 135–145)

## 2010-07-23 LAB — URINE MICROSCOPIC-ADD ON

## 2010-07-23 LAB — CBC
HCT: 28.3 % — ABNORMAL LOW (ref 39.0–52.0)
Hemoglobin: 9.3 g/dL — ABNORMAL LOW (ref 13.0–17.0)
MCH: 28.4 pg (ref 26.0–34.0)
MCHC: 32.9 g/dL (ref 30.0–36.0)
MCV: 86.3 fL (ref 78.0–100.0)
Platelets: 140 10*3/uL — ABNORMAL LOW (ref 150–400)
RBC: 3.28 MIL/uL — ABNORMAL LOW (ref 4.22–5.81)
RDW: 13.8 % (ref 11.5–15.5)
WBC: 12.5 10*3/uL — ABNORMAL HIGH (ref 4.0–10.5)

## 2010-07-23 LAB — GLUCOSE, CAPILLARY
Glucose-Capillary: 115 mg/dL — ABNORMAL HIGH (ref 70–99)
Glucose-Capillary: 94 mg/dL (ref 70–99)

## 2010-07-23 NOTE — Op Note (Signed)
NAME:  Gregory Wiley, Gregory Wiley NO.:  [id number]  MEDICAL RECORD NO.:  [id number]           PATIENT TYPE:  I  LOCATION:  2306                         FACILITY:  [facility]  PHYSICIAN:  Evelene Croon, M.D.     DATE OF BIRTH:  [birth year]  DATE OF PROCEDURE:  07/20/2010 DATE OF DISCHARGE:                              OPERATIVE REPORT   PREOPERATIVE DIAGNOSIS:  Severe two-vessel coronary artery disease status post non-ST-segment elevation myocardial infarction  POSTOPERATIVE DIAGNOSIS:  Severe two-vessel coronary artery disease status post non-ST-segment elevation myocardial infarction.  OPERATIVE PROCEDURE:  Median sternotomy, extracorporeal circulation, coronary artery bypass graft surgery x4 using a left internal mammary artery graft to the left anterior descending coronary artery, with a saphenous vein graft to the acute marginal branch of the right coronary artery, and a sequential saphenous vein graft to the distal right coronary artery and then the posterior descending coronary artery. Endoscopic vein harvesting from the right leg.  ATTENDING SURGEON:  Evelene Croon, MD  ASSISTANT:  Salvatore Decent. Cornelius Moras, MD  SECOND ASSISTANT:  Doree Fudge, [facility]  ANESTHESIA:  General endotracheal.  CLINICAL HISTORY:  This patient is a 60 year old morbidly obese gentleman with a history of hypertension and dyslipidemia as well as obstructive sleep apnea who has a history of coronary artery disease status post stenting of his right coronary artery in November 2009.  He had further stenting in the right coronary artery system in October 2011.  He has a known chronic total occlusion of the proximal LAD almost at the ostium with collaterals to the mid and distal LAD from the left circumflex and right coronary arteries.  He presented with a 1-week history of exertional substernal chest discomfort radiating in the right arm relieved with rest.  About 1 week ago, he developed chest pain  that awoken from sleep and radiated to his right arm.  In the emergency room, his electrocardiogram showed sinus tachycardia without ischemic changes. Initial troponin was elevated at 0.5 with a normal CPK and MB.  He has subsequent elevations troponin to 1.15 with a peak CPK of 102 and an MB of 6.  He underwent cardiac catheterization which showed severe two- vessel disease.  The left main and left circumflex systems had no disease.  The LAD was occluded almost at his ostium with left-to-left and right-to-left collaterals to the mid and apical LAD.  The right coronary artery had multiple stents in the proximal mid and distal vessel with a napkin ring lesion just at the terminal aspect of the stent at the bifurcation of posterior descending and the ongoing right coronary artery.  This supplied a small posterolateral.  Left internal mammary artery was patent.  Left ventricular ejection fraction was estimated at 25% with slight dilatation of left ventricle and anterior and apical akinesis with inferior wall hypokinesis.  End-diastolic pressure was 14.  There was no gradient across the aortic valve.  After review of the catheterization and examination of the patient, it was felt that coronary artery bypass graft surgery is the best treatment to prevent further ischemia and infarction.  I discussed the operative procedure with  the patient and his family.  We discussed alternatives, benefits, and risks including but not limited to bleeding, blood transfusion, infection, stroke, myocardial infarction, organ dysfunction, and death.  He understood all of this and agreed to proceed.  Also discussed the importance of maximum cardiac risk factor reductioning to decrease the risk of development of further atherosclerotic stenoses.  OPERATIVE PROCEDURE:  The patient was taken to the operating room and placed on the table in supine position.  After induction of general endotracheal anesthesia, a  Foley catheter was placed in the bladder using sterile technique.  Then the chest, abdomen and both lower extremities were prepped and draped in usual sterile manner.  The chest was entered through a median sternotomy incision.  The pericardium was opened in the midline.  Examination of the heart showed good ventricular contractility.  The ascending aorta was of normal size and had no palpable plaques in it.  Then the left internal mammary artery was harvested from the chest wall using pedicle graft.  This is a medium caliber vessel with excellent blood flow through it.  This was difficult to harvest due to the large amount of fat present in the mediastinum and his large body size.  At the same time segment of greater saphenous vein was harvested from the right leg using endoscopic vein harvest technique.  This vein was of medium size and good quality.  Then the patient was heparinized when an adequate ACT was obtained, the distal ascending aorta was cannulated using a 22-French aortic cannula for arterial inflow.  Venous outflow was achieved using a two-stage venous cannula for the right atrial appendage.  An antegrade cardioplegia and vent cannula was inserted in the aortic root.  A retrograde cardioplegic cannula was inserted through a pursestring suture in the right atrium and advanced in the coronary sinus without difficulty.  The patient was placed on cardiopulmonary bypass and distal coronaries identified.  The LAD was a moderate-sized vessel that was diffusely diseased.  The only section that was suitable for grafting was in the midportion where it was soft enough to open.  There were 2 diagonal branches, the first of which was moderate-sized vessel and the second one was small and diffusely diseased.  The right coronary artery was diffusely diseased.  This extended out into the proximal and midportion of the posterior descending coronary artery.  This vessel was  small distally but felt to be graftable.  The posterolateral was too small to graft itself, but did trace the right coronary artery out beyond the posterior descending branch where it was moderately diseased but graftable and still fairly large.  There was evidence of previous inferior infarction with some patchy scar present.  Then the aorta was crossclamped and 500 mL of cold blood antegrade cardioplegia was administered in the aortic root with quick arrest of the heart.  This was followed by 500 mL of cold blood retrograde cardioplegia.  Systemic hypothermia to about 34 degrees centigrade and topical hypothermia with  iced saline was used.  A temperature probe was placed in septum and an insulating pad in the pericardium.  The first distal anastomosis was performed to the posterior descending coronary artery.  The internal diameter distally was about 1.5-1.6 mm. The conduit used was a segment of greater saphenous vein and anastomosis was performed in an end-to-side manner using continuous 7-0 Prolene suture.  Flow was noted through the graft and was good.  A second distal anastomosis was performed to the distal right coronary artery  just beyond the posterior descending takeoff.  The internal diameter here was about 2.5 mm.  The conduit used was a same segment of greater saphenous vein and anastomosis was performed in a sequential side-to-side manner with continuous 7-0 Prolene suture.  Flow was noted through the graft and was excellent.  Then another dose of cardioplegia was given down his vein graft and the aortic root.  The third distal anastomosis was performed to the acute marginal branch. This vessel was essentially occluded and was a moderate-sized vessel. The internal diameter was about 1.6 mm.  Conduit used was a second segment of greater saphenous vein and anastomosis was performed in end- to-side manner using continuous 7-0 Prolene suture.  Flow was noted through the  graft and was excellent.  Fourth distal anastomosis was performed in the mid LAD.  The internal diameter was about 1.6 mm.  The conduit used was the left internal mammary graft and this was brought through an opening in the left pericardium anterior to the phrenic nerve.  It was anastomosed to the LAD in an end-to-side manner using continuous 8-0 Prolene suture.  The pedicle was sutured to the epicardium with 6-0 Prolene sutures.  Then another dose of retrograde cardioplegia was given.  With crossclamp in place, the two proximal vein graft anastomoses performed in the mid ascending aorta in end-to-side manner using continuous 6-0 Prolene suture.  Then the clamp was removed from mammary pedicle.  There was rapid warming of the ventricular septum and return of spontaneous ventricular fibrillation.  Crossclamp was removed with a time of 95 minutes and the patient spontaneously returned in sinus rhythm.  The proximal and distal anastomoses appeared hemostatic and allowed the grafts satisfactory.  Graft markers were placed around the proximal anastomoses.  Two temporary right ventricular and right atrial pacing wires placed and brought through the skin.  When the patient rewarmed to 37 degrees centigrade, he was weaned from cardiopulmonary bypass on no inotropic agents.  Total bypass time was 122 minutes.  Transesophageal echocardiogram was again performed and showed preserved left ventricular function.  The patient's ventricle was dilated as noted preoperatively all walls removing including the anterior wall.  There is trivial mitral regurgitation.  Then protamine was given and venous and aortic cannula was removed without difficulty. Hemostasis was achieved.  Three chest tubes were placed with two in the posterior pericardium, one in the left pleural space, one in the anterior mediastinum.  The sternum was then reapproximated with double #6 stainless steel wires.  The fascia was closed  with continuous #1 Vicryl suture.  Subcutaneous tissue was closed with continuous 2-0 Vicryl and the skin with a 3-0 Vicryl subcuticular closure.  The lower extremity vein harvest site was closed in layers in similar manner. Sponge, needle and instrument counts were correct per the scrub nurse. Dry sterile dressing was applied over the incisions around the chest tubes with short Pleur-Evac suction.  The patient remained hemodynamically stable and was transferred to the SICU in guarded but stable condition.     Evelene Croon, M.D.     BB/MEDQ  D:  07/20/2010  T:  07/21/2010  Job:  [id number]  cc:   Thereasa Solo. Little, M.D.  Electronically Signed by Evelene Croon M.D. on 07/23/2010 11:27:21 AM

## 2010-07-24 LAB — URINE CULTURE
Colony Count: 6000
Culture  Setup Time: 201205072104

## 2010-07-31 NOTE — Discharge Summary (Signed)
NAME:  Gregory Wiley, Gregory Wiley NO.:  [id number]   MEDICAL RECORD NO.:  [id number]          PATIENT TYPE:  INP   LOCATION:  2006                         FACILITY:  [facility]   PHYSICIAN:  Cristy Hilts. Jacinto Halim, MD       DATE OF BIRTH:  [birth year]   DATE OF ADMISSION:  11/30/2007  DATE OF DISCHARGE:  12/04/2007                               DISCHARGE SUMMARY   DISCHARGE DIAGNOSES:  1. Anterior lateral wall myocardial infarction.  2. Coronary artery disease status post cardiac catheterization on      December 01, 2007, showing extensive disease.  Catheterization was      performed by Dr. Nicki Guadalajara.  The patient did not want to      consider coronary artery bypass grafting, thus intervention was      done.  He had multiple blockages in his right coronary artery.  He      underwent percutaneous coronary intervention of 5 different sites      in his right coronary artery, and he had 4 Cypher stents placed 3.0      x 33, 3.0 x 13, and 3.0 x 13.  Please see Dr. Landry Dyke note for      complete details.  He does have residual disease and his left      anterior descending has 100% of proximal ostial disease.  Plan      initially was to do a staged procedure, however, that is still      under discussion at the time of discharge, and it was decided at      this time to treat him medically.  He has 20%-30% in his      circumflex.  He does have collaterals from his circumflex to his      left anterior descending.  3. Low normal ejection fraction per Dr. Landry Dyke catheterization note.  4. Dyslipidemia with low HDL.  LDL was approximately 112.  5. Morbid obesity.  6. Premature family history of coronary artery disease.  7. Symptoms of sleep apnea.   LABORATORIES:  Point-of-care markers.  1. CK-MB 33, troponin 6.44.  2. CK-MB 24, troponin of 4.72.   His CPK-MB 520/13 with a troponin of 6.65.  CK-MB 322/10.8, troponin of  4.52, and CK of 306, MB of 8.9, and troponin of 3.95.  Lipid profile;  total cholesterol 165, triglycerides 103, HDL 32, and LDL of 112.  TSH  1.594.  Sodium 134, potassium 3.8, chloride 102, CO2 of 26, BUN 12,  creatinine 1.07, hemoglobin 13.1, hematocrit 38.5, WBC 7.1, and  platelets 189.   Chest x-ray showed stable cardiomegaly, mild pulmonary vascular  congestion, and minimal chronic bronchitic changes done on November 30, 2007.   PROCEDURES:  Cardiac catheterization, December 01, 2007, by Dr. Nicki Guadalajara, please see his dictation.   DISCHARGE MEDICATIONS:  1. Simvastatin 40 mg at bedtime.  2. Metoprolol 50 mg twice per day.  3. Ramipril 5 mg a day.  4. Aspirin 81 mg 2 per day.  5. Plavix 75 mg per day, he was told not to stop  this.  6. Nitroglycerin 1/150 one under the tongue q.5 minutes x3 when needed      for chest pain.   If he has any problems with his groin, he should call our office.  He  should do no lifting, pushing, pulling, or exercise for 1 week, and no  driving until Monday.  He will follow up in our office in 1-2 weeks.  He  should not return to work until seen in the office.   HOSPITAL COURSE:  The patient came in without any previous history of  coronary artery disease.  He was seen by Dr. Ritta Slot in the  emergency room.  He had apparently had a pain, midsternal, radiating to  his right arm to his elbows, nausea, and vomiting.  The following  morning about 6 a.m., the pain was on and off all day.  He went to his  primary care doctor, Dr. Fayrene Fearing Little's office the following day and who  noted the change in his EKG with anterolateral changes, thus he was sent  to [facility] ER.  He was seen by Dr. Ritta Slot and admitted with  plans for cardiac catheterization.  He was put on IV heparin and IV  Integrilin and he underwent cardiac catheterization on December 01, 2007, by Dr. Tresa Endo.  He subsequently underwent PCI with 5 interventions  to his RCA and for stent.  He did well after the procedure.  He was seen  by  cardiac rehab.  He walked in the halls, was steady, seen by Dr. Yates Decamp on December 04, 2007, and considered stable for discharge home.  He was referred to cardiac rehab phase II program.  His blood pressure  is 109/61, pulse 73, respirations 20, temperature is 98.4, and O2 sats  were 96%.      Lezlie Octave, N.P.      Cristy Hilts. Jacinto Halim, MD  Electronically Signed    BB/MEDQ  D:  12/04/2007  T:  12/05/2007  Job:  [id number]   cc:   Aida Puffer

## 2010-07-31 NOTE — H&P (Signed)
NAME:  Gregory Wiley, Gregory Wiley NO.:  [id number]   MEDICAL RECORD NO.:  [id number]          PATIENT TYPE:  EMS   LOCATION:  MAJO                         FACILITY:  [facility]   PHYSICIAN:  Thereasa Solo. Little, M.D. DATE OF BIRTH:  05-14-1950   DATE OF ADMISSION:  11/30/2007  DATE OF DISCHARGE:                              HISTORY & PHYSICAL   CHIEF COMPLAINTS:  Chest pain, nausea and vomiting.   HISTORY OF PRESENT ILLNESS:  Gregory Wiley is a 60 year old male who had  seen Dr. Clarene Duke about 12 years ago for cardiac clearance prior to some  surgery on his hand.  He has essentially no other medical problems.  He  has no history of diabetes or hypertension, and  is on no medications at  home.  He has not had prior cardiac catheterization.  He thinks Dr.  Clarene Duke did an echocardiogram on him 12 years ago that showed some  cardiomegaly.  The patient developed midsternal chest pressure, which  radiated to his right arm to his elbow with nausea and vomiting  yesterday morning about 6:00 a.m.  He had pain off and on.  He went to  Dr. Fayrene Fearing Little's office today and was sent to [facility] for further  evaluation.  Currently he has minimal chest pain with IV nitroglycerin.   PAST MEDICAL HISTORY:  Is unremarkable for diabetes, hypertension.  His  lipid status is unknown.  He has had minor knee surgery and previous  surgery for the glass in his hand.  He is on no medications.   He is allergic to  BENADRYL, which caused joint pain and swelling.   SOCIAL HISTORY:  He is married.  He has five children.  Currently works  at the [employer].  He quit smoking 40 years ago.   FAMILY HISTORY:  Remarkable for coronary disease.  His father had a  bypass in his 27s.   REVIEW OF SYSTEMS:  Essentially unremarkable except for as noted above.  He denies any GI bleeding or melena.  He has not had a sleep apnea  study.   PHYSICAL EXAM:  Blood pressure 110/73, pulse 90, temperature 97.6,  respirations 12.  GENERAL:  He is a morbidly obese male in no acute distress.  HEENT: Normocephalic, atraumatic.  Extraocular movements are intact.  Sclerae nonicteric.  NECK:  Without JVD or bruit.  CHEST:  Clear to auscultation and percussion.  CARDIAC:  Exam reveals regular rate and rhythm without obvious murmur,  rub or gallop.  Normal S1, S2.  ABDOMEN:  Is obese, nontender.  Bowel sounds are present.  EXTREMITIES:  Without edema.  Distal pulses are intact.  NEURO:  Exam is grossly intact.  He is awake, alert and oriented,  cooperative.  Moves all extremities without obvious deficit.  SKIN:  Warm and dry.   His EKG shows anterolateral infarct, which is new compared to a previous  EKG from 2008.   LABORATORIES:  CK is 237 with 33 MB and a troponin of 6.44.  Sodium 136,  potassium 3.5, BUN 12, creatinine 1.  White count 13.2, hemoglobin 15.6,  hematocrit 46.4, platelets 202.  Chest x-ray shows stable cardiomegaly  and mild vascular congestion and minimal bronchitic changes.   IMPRESSION:  1. Anterolateral myocardial infarction, recent, currently with minimal      symptoms.  2. Morbid obesity.  3. Family history of coronary disease.   PLAN:  The patient is seen by Dr. Lynnea Ferrier and myself in the emergency  room.  He will be admitted to the CCU TCU and set up for diagnostic  catheterization tomorrow.  We will go ahead and start IV Integralin and  IV heparin.      Abelino Derrick, P.A.    ______________________________  Thereasa Solo. Little, M.D.    Lenard Lance  D:  11/30/2007  T:  11/30/2007  Job:  [id number]   cc:   Thereasa Solo. Little, M.D.  Aida Puffer, M.D.

## 2010-07-31 NOTE — Cardiovascular Report (Signed)
NAME:  Gregory, Wiley NO.:  [id number]   MEDICAL RECORD NO.:  [id number]          PATIENT TYPE:  INP   LOCATION:  2919                         FACILITY:  [facility]   PHYSICIAN:  Nicki Guadalajara, M.D.     DATE OF BIRTH:  [birth year]   DATE OF PROCEDURE:  DATE OF DISCHARGE:                            CARDIAC CATHETERIZATION   INDICATIONS:  [name] is a 60 year old gentleman with a history  of obesity.  Apparently, in the past, he had experienced a severe  episode of chest pain but was never diagnosed as having coronary artery  disease.  On Sunday, he experienced another severe episode of chest  pain, which he felt was similar to his indigestion and previous chest  discomfort.  Chest pain continued.  Ultimately, he saw his primary  physician yesterday and due to significant ECG change from a January  2008 ECG, he was transferred and admitted to [facility].  CPK  and troponins were positive.  ECG shows Q-waves anterolaterally.  Definitive catheterization was recommended.   PROCEDURE:  After premedication with 2 mg of Versed plus 25 mg of  fentanyl, the patient was prepped and draped in the usual fashion.  His  right femoral artery was punctured anteriorly and a 5-French sheath was  inserted.  Diagnostic catheterization was done utilizing 5-French  Judkins 4 left and right coronary catheters.  The right catheter was  used for selective angiography in the left internal mammary artery  system.  In the event, the patient just to undergo CABG  revascularization surgery.  Pigtail catheter was used for biplane  cineangiography as well as distal aortography.  At this point, I broke  scrub, I reviewed the angiographic findings in detail with the patient.  The catheterization did show total occlusion of his LAD, which may be  old since he does have moderate collateralization, antegrade and also  showed multiple sites of stenosis in the right coronary artery with a  99% stenosis just proximal to the bifurcation of a large PLA and PDA  system.  Options were discussed with the patient consisting of taking  him off the table for further discussion, recommending CABG  revascularization surgery, or attempting potential staged intervention  to open up his RCA and attempt opening up a probable chronic LAD  occlusion.  The patient did not want to undergo CABG surgery.  He  referred the option to attempt opening up the vessel while he was  present in the Cath Lab.  At this point, I also broke scrub and went out  and discussed these options with the patient's wife who also agreed with  this approach.  He already had been maintained on Integrilin.  He was  given an additional bolus of Integrilin plus 600 mg of Plavix.  Heparin  was administered in weight-adjusted protocol.  ACT was documented to be  therapeutic.  A 6-French sheath was inserted.  An FR-4 guide 6-French  was used for the procedure.  An ATW wire was advanced down the right  coronary artery into the PDA.  Since there were 5  sites to work on and  the PDA was small caliber, a 2 x 20 mm apex balloon was used for  dilatation of the PDA.  Multiple dilatations were made.  This was then  exchanged and a 2.5 x 15 mm cutting balloon was used for the 99%  stenosis just immediately before the bifurcation into the PDA and PLA  system.  Multiple dilatations were made with the cutting balloon and up  to 8 atmospheres.  Ultimately, the cutting balloon was then also used  for pre-dilatation at the 3 additional lesions in the distal RCA at the  acute margin and also in the mid RCA.  A 3.0 x 30 mm drug-eluting Cypher  stent was then inserted.  This was then ultimately advanced just to the  point of the bifurcation with careful attention not to jail the PDA or  the PLA and for the stent struts to start just proximal to the  bifurcation.  After careful alignment, this 3.0 x 33 mm Cypher stent was  successfully deployed  up to 13 atmospheres x2, which covered 2 lesions.  A second 3.0 x 33 mm Cypher stent was then inserted with hope that this  would cover the remaining lesions.  Unfortunately, in order to have  stent overlap to cover the third lesion in the distal RCA, this resulted  in the stent not be long enough to cover the mid lesion.  The stent was  then deployed up to 15 atmospheres x2.  A third 3.0 x 13 mm stent was  then inserted in tandem in the mid RCA to cover the mid RCA lesion and  dilated x2 up to 17 atmospheres.  A 3.5 x 20 mm [place] Voyager balloon was  used for post-stent dilatation.  Dilatation was done at the PD and PLA  bifurcation with dilatation up to 3.23 mm.  The balloon was then  successfully additionally increased for post-stent dilatation up to  3.39, 3.42, and 3.49 within the stented segment up to the mid segment.  Scout angiography confirmed an excellent angiographic result with brisk  TIMI III flow down the main RCA without impingement of the PD or PLA  vessel or narrowing.  There was initial narrowing also at the ostium of  side branch of the mid RCA.  This was not intervened upon.  Arterial  sheath was sutured in place with plantar sheath removal later today.   HEMODYNAMIC DATA:  Central aortic pressure was 113/76 and left  ventricular pressure 113/11.   ANGIOGRAPHIC DATA:  The left main coronary artery with normal vessels,  which bifurcated into an LAD and left circumflex system.  The LAD had a  nubbin and then was totally occluded just beyond the left main takeoff.  The circumflex vessel was a moderate size vessel that had 20-30%  narrowing proximally.  There was collateralization to the LAD, stenting  to the proximal portion of the LAD, but there was a gap that did appear  between the near ostial occlusion in the proximal segment of  collateralization.   The right coronary artery had mild 20% narrowing proximally.  In the mid  RCA in the region of a small anterior RV  marginal branch, there was  eccentric 85% stenosis.  This was followed by 70-80% stenosis at the  acute margin followed by a 70% stenosis, which did appear to have some  suggestion of ulceration and right prior to the bifurcation into the PD  and PLA vessel, the RCA was 99%  occluded.  The PD was a smaller caliber,  but had diffuse 70-80% stenoses in its mid segment prior to total  occlusion in the distal segment of the PDA branch.  There was faint  collateralization to the most distal small portion of this PD from the  left system.   Following difficult but successful 5-lesion intervention to the right  coronary artery, the PD 70-80% and 80% tandem lesions were successfully  dilated with the 2.0 x 20 mm apex balloon and reduced to 0%.  From the  bifurcation proximal, all other lesions underwent successful cutting  balloon atherectomy with ultimate stenting with insertion of 3 tandem  stents distally to proximally 3.0 x 33 mm DES Cypher, 3.0 x 33 mm DES  Cypher, and 3.0 x 13 mm DES Cypher with post-stent dilatation utilizing  the 3.50 [place] Voyager balloon with all lesions being reduced to 0% and  vessel taper from 3.49 to the 3.23 at the iliac bifurcation without  evidence for dissection nor brisk TIMI III flow.  There was again mild  ostial narrowing of the side branch, which arose in the mid RCA, which  was small-caliber, not intervened upon.   Left ventriculography done prior to PCI showed low normal to mild LV  contractility with an ejection fraction of 50-55%.  There was mild  hypocontractility anterolaterally being more prominent apically on the  RAO projection and appeared relatively normal on the LAO projection.   Distal aortography did not demonstrate any significant renal artery  stenosis.  There is no significant aortoiliac disease.   IMPRESSION:  1. Low-normal left ventricular contractility with mild anterolateral      hypocontractility and more significant apical  hypocontractility on      the right anterior oblique projection with normal contractility on      the left anterior oblique projection.  2. Total occlusion of the proximal near ostial left anterior      descending (question old with old versus recent with      collateralization via the left circumflex system to moderate size      left anterior descending.  3. A 20-30% proximal circumflex narrowing.  4. Diffuse right coronary artery stenosis with 85% mid, 70-80% at the      crux, 70% with suggestion of possible ulceration distally, and 99%      prior to its bifurcation into the posterior descending artery and      posterolateral artery vessel with diffuse 70-80% stenosis in the PD      vessel prior to a distal total occlusion.  5. Successful complex 5-lesion intervention to the right coronary      artery with the posterior descending artery lesions, undergoing      percutaneous transluminal coronary angioplasty and reduced to 0%      and the remaining 4      lesions undergone cutting balloon atherectomy/stenting with a 3.0      DES stent (33 mm, 33 mm, and 13 mm, in tandem) with the stenoses      being reduced to 0% and vessel tapered from 3.49-3.23 extending      from the mid to the RCA bifurcation done with      Integrilin/heparin/600 mg of Plavix/IC and IV nitroglycerin.           ______________________________  Nicki Guadalajara, M.D.     TK/MEDQ  D:  12/01/2007  T:  12/02/2007  Job:  [id number]   cc:   Paul Half, MD

## 2010-08-02 NOTE — Cardiovascular Report (Signed)
NAME:  Gregory Wiley, Gregory Wiley NO.:  [id number]  MEDICAL RECORD NO.:  [id number]           PATIENT TYPE:  I  LOCATION:  3311                         FACILITY:  [facility]  PHYSICIAN:  Thereasa Solo. Kanen Mottola, M.D. DATE OF BIRTH:  [birth year]  DATE OF PROCEDURE:  07/16/2010 DATE OF DISCHARGE:                           CARDIAC CATHETERIZATION   INDICATIONS FOR TEST:  This 60 year old male has known coronary artery disease.  He has a totally occluded LAD with large right-to-left and left-to-left collaterals.  He had multiple stents placed in his RCA and has had several in-stent restenosis treated with cutting balloon to most recent October 2011.  He presented with severe chest pain, elevation of his troponins without EKG changes consistent with a non-ST MI.  PROCEDURE:  After obtaining informed consent, the patient was prepped and draped in the usual sterile fashion exposing the right groin, following local anesthetic with 1% Xylocaine, the Seldinger technique was employed and with the use of a Smart needle, a 5-French introducer sheath was placed in the right femoral artery.  Left and right coronary arteriography, visualization of the internal mammary artery and ventriculography in the RAO projection was performed.  COMPLICATIONS:  None.  TOTAL CONTRAST USED:  80 mL  RESULTS: 1. Hemodynamic monitoring.  His central aortic pressure was 113/68.     His left ventricular pressure was 112/14.  There was no significant     gradient noted at time of pullback. 2. Ventriculography.  Ventriculography in the RAO projection using 25     mL of contrasting 12 mL per second revealed dilatation of left     ventricular cavity.  The anterior apical segments were basically     akinetic and the inferior wall was hypokinetic with an ejection     fraction of approximately 25% and an end-diastolic pressure 14.  CORONARY ARTERIOGRAPHY.: 1. Left main normal. 2. Circumflex.  The circumflex system was  free of disease and had 2 OM     vessels. 3. LAD.  The LAD a 100% occluded almost at the ostium.  There were     left-to-left and right-to-left collaterals. 4. Right coronary artery.  The right coronary had multiple stents in     the proximal mid and distal RCA with a napkin ring lesion just     distal to the most terminal stent.  This was at the bifurcation of     the PDA and the ongoing posterior lateral vessels which were     moderate in size.  There were collaterals from the right to the     LAD.  CONCLUSION: 1. Chronically occluded LAD with collaterals. 2. Napkin ring high-grade bifurcation lesion in the right coronary     artery. 3. 25% ejection fraction.  DISCUSSION:  I plan to review his angiograms but his last ejection fraction in October was 65%.  This enzyme leak showed a troponin of only one which implies that it was a not a significant infarct raising the concern that this may be stunned myocardium.  A viability study could be performed and I will do this.  A CVT as requests,  otherwise I will ask them to consider him for bypass.  He has been on Plavix and I will stop the Plavix.          ______________________________ Thereasa Solo. Xxavier Noon, M.D.     ABL/MEDQ  D:  07/16/2010  T:  07/16/2010  Job:  [id number]  cc:   [facility]  Electronically Signed by Julieanne Manson M.D. on 08/02/2010 03:52:40 PM

## 2010-08-03 NOTE — Op Note (Signed)
NAME:  Gregory Wiley, Gregory Wiley NO.:  [id number]   MEDICAL RECORD NO.:  [id number]          PATIENT TYPE:  AMB   LOCATION:  DSC                          FACILITY:  [facility]   PHYSICIAN:  Thornton Park. Daphine Deutscher, MD  DATE OF BIRTH:  1950-05-27   DATE OF PROCEDURE:  06/12/2004  DATE OF DISCHARGE:                                 OPERATIVE REPORT   PREOPERATIVE DIAGNOSIS:  Umbilical hernia.   POSTOPERATIVE DIAGNOSIS:  Umbilical hernia.   PROCEDURE:  Repair of umbilical hernia with a ventral X-mesh.   SURGEON:  Thornton Park. Daphine Deutscher, M.D.   ANESTHESIA:  General endotracheal.   DESCRIPTION OF PROCEDURE:  Gregory Wiley is a large man with an umbilical  hernia that he was recently diagnosed with and referred for a repair.  An  informed consent was obtained regarding the repair and risk of recurrence.  He was taken to room #3 at [facility] on June 12, 2004, and given general anesthesia.  The abdomen was clipped and then  prepped with Betadine and draped sterilely.  A transverse incision was made  just above the umbilicus and the hernia was dissected free from the  umbilical skin and the fascial defect remained about the size of a quarter.  The hernia itself was freed from the fascia and with my finger I tucked it  back inside and went around the perimeter and cleared a zone of fascia  around it.  A piece of ventral X-mesh was then popped into place in the  retro-fascial position and sutured through the perimeter with #0 Prolene  sutures.  This anchored it nicely and clearly closed the defect.  The area  was infiltrated with 0.5% Marcaine and irrigated again and closed.  The  umbilical skin was tacked to the fascia with #4-0 Vicryl, and then #4-0  Vicryl was used to close the subcutaneous and subcuticular layers.  Benzoin  and Steri-Strips were used on the skin.   PLAN:  Mr. Escue will be given Tylox to take for pain, and his wife was  given instructions  to have him come back to the office in approximately  three weeks.      MBM/MEDQ  D:  06/12/2004  T:  06/12/2004  Job:  [id number]   cc:   Aida Puffer  [address]  [place]  [place] [address]  Fax: [phone]

## 2010-08-10 ENCOUNTER — Other Ambulatory Visit: Payer: Self-pay | Admitting: Surgery

## 2010-08-10 DIAGNOSIS — I2510 Atherosclerotic heart disease of native coronary artery without angina pectoris: Secondary | ICD-10-CM

## 2010-08-14 ENCOUNTER — Ambulatory Visit
Admission: RE | Admit: 2010-08-14 | Discharge: 2010-08-14 | Disposition: A | Discharge status: Home / Self Care | Payer: 59 | Source: Ambulatory Visit | Attending: Surgery | Admitting: Surgery

## 2010-08-14 ENCOUNTER — Encounter (INDEPENDENT_AMBULATORY_CARE_PROVIDER_SITE_OTHER): Payer: Self-pay

## 2010-08-14 ENCOUNTER — Other Ambulatory Visit: Payer: Self-pay | Admitting: Surgery

## 2010-08-14 DIAGNOSIS — R51 Headache: Secondary | ICD-10-CM

## 2010-08-14 DIAGNOSIS — I2510 Atherosclerotic heart disease of native coronary artery without angina pectoris: Secondary | ICD-10-CM

## 2010-08-15 ENCOUNTER — Ambulatory Visit
Admission: RE | Admit: 2010-08-15 | Discharge: 2010-08-15 | Disposition: A | Discharge status: Home / Self Care | Payer: 59 | Source: Ambulatory Visit | Attending: Surgery | Admitting: Surgery

## 2010-08-15 DIAGNOSIS — R51 Headache: Secondary | ICD-10-CM

## 2010-08-15 MED ORDER — IOHEXOL 300 MG/ML  SOLN
75.0000 mL | Freq: Once | INTRAMUSCULAR | Status: AC | PRN
  Administered 2010-08-15: 75 mL via INTRAVENOUS

## 2010-08-15 NOTE — Assessment & Plan Note (Signed)
OFFICE VISIT  OLUWANIFEMI, SUSMAN DOB:  26-Aug-1950                                        Aug 15, 2010 CHART #:  [id number]  ADDENDUM:  It was previously stated the patient had labs drawn the day of his office visit to see Dr. Clarene Duke, his cardiologist.  I was able to obtain these results from Thursday and the results are as follows:  On Jul 31, 2010, CBC that was done, white count 12,100, which (is down from his last results on Jul 23, 2010 before leaving the hospital of 12,500). H and H 10.9 and 35.5 (which again is improved from 5.7, 9.3, and 28.3 respectively) and a platelet count of 498,000.  His BMET done on this date; sodium 138, potassium 4.6, BUN and creatinine 20 and 0.95 respectively.  LFTs are all within normal limits.  The only other abnormality was a glucose slightly elevated at 109 and calcium slightly low at 8.3.  I have discussed the results with the patient's wife who asked as I make a copy and leave this at the front office, so that she may picked it up to view them herself and I have done this.  I have also asked that a copy of these results be faxed to Dr. Fredirick Maudlin, the cardiologist office as well.  Hopefully, the CT of the head will be obtained today and I will follow up on these results.  Doree Fudge, PA  DZ/MEDQ  D:  08/15/2010  T:  08/15/2010  Job:  [id number]  cc:   Aida Puffer, MD Thereasa Solo. Little, M.D.

## 2010-08-15 NOTE — Assessment & Plan Note (Signed)
OFFICE VISIT  Gregory Wiley, Gregory Wiley DOB:  1950-03-23                                        Aug 14, 2010 CHART #:  [id number]  ADDENDUM  IMPRESSION AND PLAN:  The patient also had complaints of constipation. His wife has tried senna over-the-counter without relief.  He has not had a bowel movement in 2 days, however, he is passing flatus.  I told him to try to ambulate as much as possible and also he may try anything over-the-counter, i.e. MiraLax in order to help with the constipation and he should stop the senna as this does not appear to offer any relief.  He does not have any abdominal pain, nausea, or emesis.  Also, it was previously stated the patient had labs drawn in Dr. Fredirick Maudlin office.  We are going to obtain results as the patient has questions. We are also going to ensure that there are no abnormalities.  Doree Fudge, PA  DZ/MEDQ  D:  08/14/2010  T:  08/15/2010  Job:  [id number]  cc:   Thereasa Solo. Little, M.D. Evelene Croon, M.D.

## 2010-08-15 NOTE — Assessment & Plan Note (Signed)
OFFICE VISIT  Gregory Wiley, Gregory Wiley DOB:  [birth year]                                        Aug 14, 2010 CHART #:  [id number]  REASON FOR OFFICE VISIT:  Routine followup status post CABG.  HISTORY OF PRESENT ILLNESS:  This is a 60 year old Caucasian male with a history of hypertension, dyslipidemia, OSA, coronary artery disease (status post PCI with stent to the RCA in November 2009), who presented to [facility] on Jul 20, 2010, and ruled in for an non-STEMI, ischemic cardiomyopathy.  He underwent a CABG x4 (LIMA to LAD, SVG to acute marginal branch of the RCA, sequential saphenous vein graft to the distal RCA and posterior descending coronary) by Dr. Laneta Simmers on Jul 20, 2010.  The patient had a fairly uncomplicated postop course stay and was discharged in stable condition on Jul 25, 2010.  The patient has not been feeling all that well since having been discharged from the hospital. He has already seen Dr. Clarene Duke in follow up, who has already stopped his lisinopril as it was causing hypotension.  He then was restarted on Lasix for his lower extremity edema.  The patient states that most days he has a dual ache in the back of his head that comes around to the front of his temples.  He has no history of ever having a headache or migraine previously.  He occasionally has dizziness and blurry vision with this.  He is taking Ultram p.r.n. for pain, however, this does not alleviate this headache pain.  He denies any shortness of breath, chest pain, fever or chills.  PHYSICAL EXAMINATION:  GENERAL:  This is a pleasant 60 year old Caucasian male who is accompanied by his wife on this visit, who was in no acute distress, and who is alert and cooperative. VITAL SIGNS:  Are as follows.  BP 101/76, heart rate 92, respiration rate 18, O2 sat 97% on room air. CARDIOVASCULAR:  Regular rate and rhythm.  No murmurs, gallops or rubs. PULMONARY:  Clear to auscultation bilaterally.  No  rales, wheezes or rhonchi. ABDOMEN:  Soft, nontender, obese.  Bowel sounds present. EXTREMITIES:  No cyanosis, clubbing or edema.  Sternum is solid.  Wound is clean and dry.  Right lower extremity wound clean, dry and well healed.  Chest x-ray done today shows enlargement of the cardiac silhouette (no change from previously seen on x-ray, minimal basilar atelectasis (improved since last chest x-ray).  No pneumothorax and possible small left pleural effusion.  IMPRESSION AND PLAN: 1. Regarding the patient's new onset of headaches.  I have discussed     with Dr. Laneta Simmers, we are going to obtain a CT scan of the head with     contrast to rule out any abnormality (i.e. aneurysm).  The patient     was instructed he may take anything over-the-counter that helps     relieve the headache in the interim. 2. The patient's wife has kept a detailed log of his blood pressures     and weight.  He was approximately 150.4 kg when he left the     hospital, and according to his wife, his last weight he is down to     137.7 kg.  I discussed this with Dr. Laneta Simmers and we are going to     stop the Lasix for now and continue to monitor  his weight closely. 3. The patient was instructed he is to continue with sternal     precautions, i.e. no lifting more than 10 pounds for 3-4 more     weeks. 4. The patient was instructed that once we can get resolution of these     headaches and he is no longer taking any narcotics, he may begin     driving short distances, i.e. 30 minutes for last 1 day and     increase his duration and frequency as tolerated.  The patient is     going to return to see Dr. Laneta Simmers in a couple of weeks.  He already     has an appointment to see Dr. Clarene Duke again on August 21, 2010.  The     patient was also instructed to call to make a follow up appointment     to Dr. Fayrene Fearing little, his primary care physician regarding headaches     and he will be contacted regarding the results of the CT  scan.  Doree Fudge, PA  DZ/MEDQ  D:  08/14/2010  T:  08/15/2010  Job:  [id number]  cc:   Aida Puffer, MD Thereasa Solo. Little, M.D. Evelene Croon, M.D.

## 2010-08-28 ENCOUNTER — Encounter (INDEPENDENT_AMBULATORY_CARE_PROVIDER_SITE_OTHER): Payer: Self-pay | Admitting: Surgery

## 2010-08-28 ENCOUNTER — Encounter: Payer: 59 | Admitting: Surgery

## 2010-08-28 DIAGNOSIS — I2510 Atherosclerotic heart disease of native coronary artery without angina pectoris: Secondary | ICD-10-CM

## 2010-08-29 NOTE — Assessment & Plan Note (Signed)
OFFICE VISIT  Gregory Wiley, Gregory Wiley DOB:  11-Dec-1950                                        August 28, 2010 CHART #:  [id number]  The patient returned to my office today for followup, status post coronary artery bypass graft surgery on Jul 20, 2010.  He was seen in the office on Aug 14, 2010, by RPA and was having a lot of headaches at that time.  He was feeling poorly in general and was noted to have a relatively low blood pressure.  He said that Dr. Clarene Duke discontinued all of his blood pressure medications and his diuretic and since he has been feeling much better and his headache has resolved.  He did have a CT scan of the head which did not show any abnormality.  He currently is feeling fairly well and says he is walking about half mile at a time without any chest pain or shortness of breath.  He has lost about 40 pounds since surgery and is trying to watch his diet.  PHYSICAL EXAMINATION:  Vital Signs:  Blood pressure is 127/91, pulse 93 and regular, respiratory rate is 20 and unlabored.  Oxygen saturation on room air is 95%.  General:  He looks well.  Cardiac:  Regular rate and rhythm with normal heart sounds.  Lungs:  Clear.  Chest incision is healing well and the sternum is stable.  Extremities:  There is no peripheral edema.  His leg incision is healing well.  IMPRESSION:  Overall, the patient is recovering well following his surgery.  I encouraged him to continue to walk as much as possible.  He has returned to driving a car a little.  I asked him not to lift anything heavier than 10 pounds for total of 3 months from date of surgery.  He will continue to followup with Dr. Clarene Duke and he will contact me if he develops any problems with his incisions.  Gregory Wiley, M.D. Electronically Signed  BB/MEDQ  D:  08/28/2010  T:  08/29/2010  Job:  [id number]

## 2010-12-17 LAB — CBC
HCT: 38 — ABNORMAL LOW
HCT: 38.5 — ABNORMAL LOW
HCT: 42
HCT: 44.6
HCT: 45.8
Hemoglobin: 13
Hemoglobin: 13.1
Hemoglobin: 13.8
Hemoglobin: 14.8
Hemoglobin: 15.3
MCHC: 32.9
MCHC: 33.2
MCHC: 33.3
MCHC: 34.1
MCHC: 34.2
MCV: 84.4
MCV: 85.2
MCV: 86.1
MCV: 86.2
MCV: 86.9
Platelets: 175
Platelets: 189
Platelets: 191
Platelets: 194
Platelets: 202
RBC: 4.51
RBC: 4.51
RBC: 4.83
RBC: 5.18
RBC: 5.31
RDW: 13.2
RDW: 13.4
RDW: 13.4
RDW: 13.5
RDW: 13.6
WBC: 11.1 — ABNORMAL HIGH
WBC: 11.5 — ABNORMAL HIGH
WBC: 12.2 — ABNORMAL HIGH
WBC: 12.8 — ABNORMAL HIGH
WBC: 13.3 — ABNORMAL HIGH

## 2010-12-17 LAB — CK TOTAL AND CKMB (NOT AT ARMC)
CK, MB: 13.3 — ABNORMAL HIGH
Relative Index: 2.6 — ABNORMAL HIGH
Total CK: 520 — ABNORMAL HIGH

## 2010-12-17 LAB — BASIC METABOLIC PANEL
BUN: 11
BUN: 12
BUN: 12
BUN: 13
CO2: 25
CO2: 25
CO2: 26
CO2: 27
Calcium: 7.7 — ABNORMAL LOW
Calcium: 8.2 — ABNORMAL LOW
Calcium: 8.3 — ABNORMAL LOW
Calcium: 8.3 — ABNORMAL LOW
Chloride: 101
Chloride: 102
Chloride: 102
Chloride: 103
Creatinine, Ser: 0.95
Creatinine, Ser: 1.05
Creatinine, Ser: 1.07
Creatinine, Ser: 1.1
GFR calc Af Amer: >60
GFR calc Af Amer: >60
GFR calc Af Amer: >60
GFR calc Af Amer: >60
GFR calc non Af Amer: >60
GFR calc non Af Amer: >60
GFR calc non Af Amer: >60
GFR calc non Af Amer: >60
Glucose, Bld: 106 — ABNORMAL HIGH
Glucose, Bld: 125 — ABNORMAL HIGH
Glucose, Bld: 129 — ABNORMAL HIGH
Glucose, Bld: 99
Potassium: 3.4 — ABNORMAL LOW
Potassium: 3.5
Potassium: 3.7
Potassium: 3.8
Sodium: 131 — ABNORMAL LOW
Sodium: 134 — ABNORMAL LOW
Sodium: 135
Sodium: 136

## 2010-12-17 LAB — LIPID PANEL
Cholesterol: 165
HDL: 32 — ABNORMAL LOW
LDL Cholesterol: 112 — ABNORMAL HIGH
Total CHOL/HDL Ratio: 5.2
Triglycerides: 103
VLDL: 21

## 2010-12-17 LAB — DIFFERENTIAL
Basophils Absolute: 0
Basophils Relative: 0
Eosinophils Absolute: 0
Eosinophils Relative: 0
Lymphocytes Relative: 19
Lymphs Abs: 2.4
Monocytes Absolute: 0.1
Monocytes Relative: 1 — ABNORMAL LOW
Neutro Abs: 10.2 — ABNORMAL HIGH
Neutrophils Relative %: 79 — ABNORMAL HIGH

## 2010-12-17 LAB — HEPARIN LEVEL (UNFRACTIONATED)
Heparin Unfractionated: 0.13 — ABNORMAL LOW
Heparin Unfractionated: <0.1 — ABNORMAL LOW
Heparin Unfractionated: <0.1 — ABNORMAL LOW

## 2010-12-17 LAB — URINE MICROSCOPIC-ADD ON

## 2010-12-17 LAB — HEMOGLOBIN A1C
Hgb A1c MFr Bld: 6
Mean Plasma Glucose: 126

## 2010-12-17 LAB — CARDIAC PANEL(CRET KIN+CKTOT+MB+TROPI)
CK, MB: 10.8 — ABNORMAL HIGH
CK, MB: 8.9 — ABNORMAL HIGH
Relative Index: 2.9 — ABNORMAL HIGH
Relative Index: 3.4 — ABNORMAL HIGH
Total CK: 306 — ABNORMAL HIGH
Total CK: 322 — ABNORMAL HIGH
Troponin I: 3.95
Troponin I: 4.52

## 2010-12-17 LAB — TROPONIN I: Troponin I: 6.65

## 2010-12-17 LAB — URINALYSIS, ROUTINE W REFLEX MICROSCOPIC
Bilirubin Urine: NEGATIVE
Glucose, UA: NEGATIVE
Hgb urine dipstick: NEGATIVE
Ketones, ur: 15 — AB
Nitrite: NEGATIVE
Protein, ur: NEGATIVE
Specific Gravity, Urine: 1.026
Urobilinogen, UA: 2 — ABNORMAL HIGH
pH: 6

## 2010-12-19 LAB — DIFFERENTIAL
Basophils Absolute: 0
Basophils Relative: 0
Eosinophils Absolute: 0
Eosinophils Relative: 0
Lymphocytes Relative: 15
Lymphs Abs: 2
Monocytes Absolute: 0.9
Monocytes Relative: 7
Neutro Abs: 10.2 — ABNORMAL HIGH
Neutrophils Relative %: 78 — ABNORMAL HIGH

## 2010-12-19 LAB — APTT: aPTT: 68 — ABNORMAL HIGH

## 2010-12-19 LAB — POCT CARDIAC MARKERS
CKMB, poc: 24.8
CKMB, poc: 33.8
Myoglobin, poc: 164
Myoglobin, poc: 237
Troponin i, poc: 4.72
Troponin i, poc: 6.44

## 2010-12-19 LAB — POCT I-STAT, CHEM 8
BUN: 12
Calcium, Ion: 1.05 — ABNORMAL LOW
Chloride: 102
Creatinine, Ser: 1
Glucose, Bld: 145 — ABNORMAL HIGH
HCT: 49
Hemoglobin: 16.7
Potassium: 3.5
Sodium: 136
TCO2: 24

## 2010-12-19 LAB — CBC
HCT: 46.4
Hemoglobin: 15.6
MCHC: 33.6
MCV: 86
Platelets: 202
RBC: 5.39
RDW: 13.3
WBC: 13.2 — ABNORMAL HIGH

## 2010-12-19 LAB — COMPREHENSIVE METABOLIC PANEL
ALT: 29
AST: 83 — ABNORMAL HIGH
Albumin: 3.2 — ABNORMAL LOW
Alkaline Phosphatase: 72
BUN: 12
CO2: 27
Calcium: 8.5
Chloride: 102
Creatinine, Ser: 0.99
GFR calc Af Amer: >60
GFR calc non Af Amer: >60
Glucose, Bld: 125 — ABNORMAL HIGH
Potassium: 4
Sodium: 136
Total Bilirubin: 1.2
Total Protein: 7.4

## 2010-12-19 LAB — PROTIME-INR
INR: 1
Prothrombin Time: 13.9

## 2010-12-19 LAB — TSH: TSH: 1.594

## 2011-06-30 ENCOUNTER — Emergency Department (HOSPITAL_COMMUNITY): Payer: 59

## 2011-06-30 ENCOUNTER — Inpatient Hospital Stay (HOSPITAL_COMMUNITY)
Admission: EM | Admit: 2011-06-30 | Discharge: 2011-07-03 | DRG: 247 | Disposition: A | Discharge status: Home / Self Care | Payer: 59 | Attending: Internal Medicine | Admitting: Internal Medicine

## 2011-06-30 ENCOUNTER — Encounter (HOSPITAL_COMMUNITY): Payer: Self-pay | Admitting: *Deleted

## 2011-06-30 DIAGNOSIS — S2223XK Sternal manubrial dissociation, subsequent encounter for fracture with nonunion: Secondary | ICD-10-CM | POA: Diagnosis present

## 2011-06-30 DIAGNOSIS — I509 Heart failure, unspecified: Secondary | ICD-10-CM | POA: Diagnosis present

## 2011-06-30 DIAGNOSIS — Z8249 Family history of ischemic heart disease and other diseases of the circulatory system: Secondary | ICD-10-CM

## 2011-06-30 DIAGNOSIS — I2589 Other forms of chronic ischemic heart disease: Secondary | ICD-10-CM | POA: Diagnosis present

## 2011-06-30 DIAGNOSIS — E785 Hyperlipidemia, unspecified: Secondary | ICD-10-CM | POA: Diagnosis present

## 2011-06-30 DIAGNOSIS — E119 Type 2 diabetes mellitus without complications: Secondary | ICD-10-CM | POA: Diagnosis present

## 2011-06-30 DIAGNOSIS — I2510 Atherosclerotic heart disease of native coronary artery without angina pectoris: Secondary | ICD-10-CM | POA: Diagnosis present

## 2011-06-30 DIAGNOSIS — Z833 Family history of diabetes mellitus: Secondary | ICD-10-CM

## 2011-06-30 DIAGNOSIS — G4733 Obstructive sleep apnea (adult) (pediatric): Secondary | ICD-10-CM | POA: Diagnosis present

## 2011-06-30 DIAGNOSIS — I252 Old myocardial infarction: Secondary | ICD-10-CM

## 2011-06-30 DIAGNOSIS — Z95820 Peripheral vascular angioplasty status with implants and grafts: Secondary | ICD-10-CM

## 2011-06-30 DIAGNOSIS — I214 Non-ST elevation (NSTEMI) myocardial infarction: Principal | ICD-10-CM | POA: Diagnosis present

## 2011-06-30 DIAGNOSIS — Z951 Presence of aortocoronary bypass graft: Secondary | ICD-10-CM | POA: Diagnosis present

## 2011-06-30 DIAGNOSIS — E669 Obesity, unspecified: Secondary | ICD-10-CM | POA: Diagnosis present

## 2011-06-30 DIAGNOSIS — Z6841 Body Mass Index (BMI) 40.0 and over, adult: Secondary | ICD-10-CM

## 2011-06-30 DIAGNOSIS — Z7982 Long term (current) use of aspirin: Secondary | ICD-10-CM

## 2011-06-30 DIAGNOSIS — I255 Ischemic cardiomyopathy: Secondary | ICD-10-CM | POA: Diagnosis present

## 2011-06-30 DIAGNOSIS — G4730 Sleep apnea, unspecified: Secondary | ICD-10-CM | POA: Diagnosis present

## 2011-06-30 DIAGNOSIS — I10 Essential (primary) hypertension: Secondary | ICD-10-CM | POA: Diagnosis present

## 2011-06-30 HISTORY — DX: Atherosclerotic heart disease of native coronary artery without angina pectoris: I25.10

## 2011-06-30 HISTORY — DX: Obesity, unspecified: E66.9

## 2011-06-30 HISTORY — DX: Hyperlipidemia, unspecified: E78.5

## 2011-06-30 HISTORY — DX: Essential (primary) hypertension: I10

## 2011-06-30 LAB — COMPREHENSIVE METABOLIC PANEL
ALT: 19 U/L (ref 0–53)
AST: 39 U/L — ABNORMAL HIGH (ref 0–37)
Albumin: 3.4 g/dL — ABNORMAL LOW (ref 3.5–5.2)
Alkaline Phosphatase: 80 U/L (ref 39–117)
BUN: 17 mg/dL (ref 6–23)
CO2: 25 mEq/L (ref 19–32)
Calcium: 9 mg/dL (ref 8.4–10.5)
Chloride: 101 mEq/L (ref 96–112)
Creatinine, Ser: 1.19 mg/dL (ref 0.50–1.35)
GFR calc Af Amer: 74 mL/min — ABNORMAL LOW (ref >90)
GFR calc non Af Amer: 64 mL/min — ABNORMAL LOW (ref >90)
Glucose, Bld: 129 mg/dL — ABNORMAL HIGH (ref 70–99)
Potassium: 4.1 mEq/L (ref 3.5–5.1)
Sodium: 136 mEq/L (ref 135–145)
Total Bilirubin: 0.4 mg/dL (ref 0.3–1.2)
Total Protein: 7.2 g/dL (ref 6.0–8.3)

## 2011-06-30 LAB — CBC
HCT: 43.2 % (ref 39.0–52.0)
Hemoglobin: 14.3 g/dL (ref 13.0–17.0)
MCH: 28.2 pg (ref 26.0–34.0)
MCHC: 33.1 g/dL (ref 30.0–36.0)
MCV: 85.2 fL (ref 78.0–100.0)
Platelets: 199 10*3/uL (ref 150–400)
RBC: 5.07 MIL/uL (ref 4.22–5.81)
RDW: 13.6 % (ref 11.5–15.5)
WBC: 10.3 10*3/uL (ref 4.0–10.5)

## 2011-06-30 LAB — CARDIAC PANEL(CRET KIN+CKTOT+MB+TROPI)
CK, MB: 27.8 ng/mL (ref 0.3–4.0)
Relative Index: 9.7 — ABNORMAL HIGH (ref 0.0–2.5)
Total CK: 287 U/L — ABNORMAL HIGH (ref 7–232)
Troponin I: 1.09 ng/mL (ref <0.30)

## 2011-06-30 LAB — DIFFERENTIAL
Basophils Absolute: 0 10*3/uL (ref 0.0–0.1)
Basophils Relative: 0 % (ref 0–1)
Eosinophils Absolute: 0.5 10*3/uL (ref 0.0–0.7)
Eosinophils Relative: 5 % (ref 0–5)
Lymphocytes Relative: 34 % (ref 12–46)
Lymphs Abs: 3.5 10*3/uL (ref 0.7–4.0)
Monocytes Absolute: 0.9 10*3/uL (ref 0.1–1.0)
Monocytes Relative: 9 % (ref 3–12)
Neutro Abs: 5.3 10*3/uL (ref 1.7–7.7)
Neutrophils Relative %: 52 % (ref 43–77)

## 2011-06-30 LAB — TROPONIN I
Troponin I: 6.39 ng/mL (ref <0.30)
Troponin I: 22.3 ng/mL (ref <0.30)

## 2011-06-30 LAB — POCT I-STAT TROPONIN I: Troponin i, poc: 1.08 ng/mL (ref 0.00–0.08)

## 2011-06-30 LAB — PROTIME-INR
INR: 1.06 (ref 0.00–1.49)
Prothrombin Time: 14 seconds (ref 11.6–15.2)

## 2011-06-30 LAB — GLUCOSE, CAPILLARY
Glucose-Capillary: 171 mg/dL — ABNORMAL HIGH (ref 70–99)
Glucose-Capillary: 86 mg/dL (ref 70–99)
Glucose-Capillary: 101 mg/dL — ABNORMAL HIGH (ref 70–99)

## 2011-06-30 LAB — HEMOGLOBIN A1C
Hgb A1c MFr Bld: 5.7 % — ABNORMAL HIGH (ref <5.7)
Mean Plasma Glucose: 117 mg/dL — ABNORMAL HIGH (ref <117)

## 2011-06-30 LAB — LIPID PANEL
Cholesterol: 133 mg/dL (ref 0–200)
HDL: 43 mg/dL (ref >39)
LDL Cholesterol: 75 mg/dL (ref 0–99)
Total CHOL/HDL Ratio: 3.1 RATIO
Triglycerides: 74 mg/dL (ref <150)
VLDL: 15 mg/dL (ref 0–40)

## 2011-06-30 LAB — APTT: aPTT: 31 seconds (ref 24–37)

## 2011-06-30 LAB — HEPARIN LEVEL (UNFRACTIONATED)
Heparin Unfractionated: 0.28 IU/mL — ABNORMAL LOW (ref 0.30–0.70)
Heparin Unfractionated: 0.39 IU/mL (ref 0.30–0.70)

## 2011-06-30 LAB — PRO B NATRIURETIC PEPTIDE: Pro B Natriuretic peptide (BNP): 1224 pg/mL — ABNORMAL HIGH (ref 0–125)

## 2011-06-30 LAB — MRSA PCR SCREENING: MRSA by PCR: NEGATIVE

## 2011-06-30 MED ORDER — ASPIRIN 325 MG PO TABS
325.0000 mg | ORAL_TABLET | Freq: Every day | ORAL | Status: DC
  Administered 2011-06-30 – 2011-07-02 (×2): 325 mg via ORAL
  Filled 2011-06-30 (×4): qty 1

## 2011-06-30 MED ORDER — ONDANSETRON HCL 4 MG/2ML IJ SOLN: 4.0000 mg | Freq: Four times a day (QID) | INTRAMUSCULAR | Status: DC | PRN

## 2011-06-30 MED ORDER — ASPIRIN 81 MG PO CHEW
324.0000 mg | CHEWABLE_TABLET | ORAL | Status: AC
  Administered 2011-07-01: 324 mg via ORAL
  Filled 2011-06-30: qty 4

## 2011-06-30 MED ORDER — POTASSIUM CHLORIDE IN NACL 20-0.9 MEQ/L-% IV SOLN
INTRAVENOUS | Status: DC
  Administered 2011-06-30: 07:00:00 via INTRAVENOUS
  Filled 2011-06-30 (×3): qty 1000

## 2011-06-30 MED ORDER — ONDANSETRON HCL 4 MG PO TABS: 4.0000 mg | ORAL_TABLET | Freq: Four times a day (QID) | ORAL | Status: DC | PRN

## 2011-06-30 MED ORDER — CARVEDILOL 3.125 MG PO TABS
3.1250 mg | ORAL_TABLET | Freq: Two times a day (BID) | ORAL | Status: DC
  Administered 2011-06-30 – 2011-07-03 (×5): 3.125 mg via ORAL
  Filled 2011-06-30 (×9): qty 1

## 2011-06-30 MED ORDER — ACETAMINOPHEN 650 MG RE SUPP: 650.0000 mg | Freq: Four times a day (QID) | RECTAL | Status: DC | PRN

## 2011-06-30 MED ORDER — HEPARIN BOLUS VIA INFUSION
4000.0000 [IU] | Freq: Once | INTRAVENOUS | Status: AC
  Administered 2011-06-30: 4000 [IU] via INTRAVENOUS

## 2011-06-30 MED ORDER — SODIUM CHLORIDE 0.45 % IV SOLN
INTRAVENOUS | Status: DC
  Administered 2011-07-01: 05:00:00 via INTRAVENOUS

## 2011-06-30 MED ORDER — SODIUM CHLORIDE 0.9 % IV SOLN: 250.0000 mL | INTRAVENOUS | Status: DC | PRN

## 2011-06-30 MED ORDER — SODIUM CHLORIDE 0.9 % IV SOLN: INTRAVENOUS | Status: DC

## 2011-06-30 MED ORDER — SODIUM CHLORIDE 0.9 % IV BOLUS (SEPSIS)
500.0000 mL | Freq: Once | INTRAVENOUS | Status: AC
  Administered 2011-06-30: 500 mL via INTRAVENOUS

## 2011-06-30 MED ORDER — HEPARIN (PORCINE) IN NACL 100-0.45 UNIT/ML-% IJ SOLN
1600.0000 [IU]/h | INTRAMUSCULAR | Status: DC
  Administered 2011-06-30: 1600 [IU]/h via INTRAVENOUS
  Administered 2011-06-30: 1400 [IU]/h via INTRAVENOUS
  Administered 2011-07-01: 1600 [IU]/h via INTRAVENOUS
  Filled 2011-06-30 (×4): qty 250

## 2011-06-30 MED ORDER — SODIUM CHLORIDE 0.45 % IV SOLN: INTRAVENOUS | Status: DC

## 2011-06-30 MED ORDER — CLOPIDOGREL BISULFATE 300 MG PO TABS
300.0000 mg | ORAL_TABLET | Freq: Once | ORAL | Status: AC
  Administered 2011-06-30: 300 mg via ORAL
  Filled 2011-06-30: qty 1

## 2011-06-30 MED ORDER — DIAZEPAM 5 MG PO TABS
5.0000 mg | ORAL_TABLET | ORAL | Status: AC
Start: 2011-07-01 — End: 2011-07-01
  Administered 2011-07-01: 5 mg via ORAL
  Filled 2011-06-30: qty 1

## 2011-06-30 MED ORDER — SODIUM CHLORIDE 0.9 % IJ SOLN: 3.0000 mL | INTRAMUSCULAR | Status: DC | PRN

## 2011-06-30 MED ORDER — HYDROCODONE-ACETAMINOPHEN 5-325 MG PO TABS: 1.0000 | ORAL_TABLET | ORAL | Status: DC | PRN

## 2011-06-30 MED ORDER — CLOPIDOGREL BISULFATE 75 MG PO TABS
75.0000 mg | ORAL_TABLET | Freq: Every day | ORAL | Status: DC
  Administered 2011-06-30: 75 mg via ORAL
  Filled 2011-06-30 (×2): qty 1

## 2011-06-30 MED ORDER — PANTOPRAZOLE SODIUM 40 MG PO TBEC
40.0000 mg | DELAYED_RELEASE_TABLET | Freq: Every day | ORAL | Status: DC
  Administered 2011-06-30 – 2011-07-03 (×4): 40 mg via ORAL
  Filled 2011-06-30 (×4): qty 1

## 2011-06-30 MED ORDER — NITROGLYCERIN 0.4 MG SL SUBL: 0.4000 mg | SUBLINGUAL_TABLET | SUBLINGUAL | Status: DC | PRN

## 2011-06-30 MED ORDER — ACETAMINOPHEN 325 MG PO TABS
650.0000 mg | ORAL_TABLET | Freq: Four times a day (QID) | ORAL | Status: DC | PRN
  Administered 2011-06-30: 650 mg via ORAL
  Filled 2011-06-30: qty 2

## 2011-06-30 MED ORDER — ALUM & MAG HYDROXIDE-SIMETH 200-200-20 MG/5ML PO SUSP: 30.0000 mL | Freq: Four times a day (QID) | ORAL | Status: DC | PRN

## 2011-06-30 MED ORDER — MORPHINE SULFATE 2 MG/ML IJ SOLN: 1.0000 mg | INTRAMUSCULAR | Status: DC | PRN

## 2011-06-30 MED ORDER — HEPARIN BOLUS VIA INFUSION
1000.0000 [IU] | Freq: Once | INTRAVENOUS | Status: AC
  Administered 2011-06-30: 1000 [IU] via INTRAVENOUS
  Filled 2011-06-30: qty 1000

## 2011-06-30 MED ORDER — SIMVASTATIN 40 MG PO TABS
40.0000 mg | ORAL_TABLET | Freq: Every evening | ORAL | Status: DC
  Administered 2011-06-30 – 2011-07-02 (×3): 40 mg via ORAL
  Filled 2011-06-30 (×4): qty 1

## 2011-06-30 MED ORDER — NITROGLYCERIN 2 % TD OINT
0.5000 [in_us] | TOPICAL_OINTMENT | Freq: Three times a day (TID) | TRANSDERMAL | Status: DC
  Administered 2011-06-30 – 2011-07-01 (×4): 0.5 [in_us] via TOPICAL
  Filled 2011-06-30: qty 30

## 2011-06-30 MED ORDER — INSULIN ASPART 100 UNIT/ML ~~LOC~~ SOLN
0.0000 [IU] | Freq: Three times a day (TID) | SUBCUTANEOUS | Status: DC
  Administered 2011-06-30: 3 [IU] via SUBCUTANEOUS

## 2011-06-30 MED ORDER — SODIUM CHLORIDE 0.9 % IV SOLN
Freq: Once | INTRAVENOUS | Status: AC
  Administered 2011-06-30: 03:00:00 via INTRAVENOUS

## 2011-06-30 MED ORDER — ZOLPIDEM TARTRATE 5 MG PO TABS: 5.0000 mg | ORAL_TABLET | Freq: Every evening | ORAL | Status: DC | PRN

## 2011-06-30 MED ORDER — NITROGLYCERIN IN D5W 200-5 MCG/ML-% IV SOLN
5.0000 ug/min | Freq: Once | INTRAVENOUS | Status: AC
  Administered 2011-06-30: 10 ug/min via INTRAVENOUS
  Filled 2011-06-30: qty 250

## 2011-06-30 NOTE — Progress Notes (Signed)
ANTICOAGULATION CONSULT NOTE - Initial Consult  Pharmacy Consult for heparin Indication: Ruled in NSTEMI  Allergies  Allergen Reactions  . Benadryl Allergy Anaphylaxis    Patient Measurements: Height: 6' (182.9 cm) Weight: 337 lb 1.3 oz (152.9 kg) IBW/kg (Calculated) : 77.6   Vital Signs: Temp: 98.3 F (36.8 C) (04/14 0600) Temp src: Oral (04/14 0600) BP: 84/41 mmHg (04/14 1600) Pulse Rate: 70  (04/14 1600)  Labs:  Basename 06/30/11 1600 06/30/11 0839 06/30/11 0829 06/30/11 0202  HGB -- -- -- 14.3  HCT -- -- -- 43.2  PLT -- -- -- 199  APTT -- -- -- 31  LABPROT -- -- -- 14.0  INR -- -- -- 1.06  HEPARINUNFRC 0.39 0.28* -- --  CREATININE -- -- -- 1.19  CKTOTAL -- -- -- 287*  CKMB -- -- -- 27.8*  TROPONINI -- -- 6.39* 1.09*   Estimated Creatinine Clearance: 99.3 ml/min (by C-G formula based on Cr of 1.19).  Medical History: Past Medical History  Diagnosis Date  . Coronary artery disease   . MI (myocardial infarction)   . Diabetes mellitus   . Hypertension   . Hyperlipidemia   . Obesity (BMI 30-39.9)   . Obstructive sleep apnea     Does not use a CPAP    Assessment: 61yo male with prior CAD c/o intermittent CP radiating to R arm since Thursday, initial troponin elevated, began heparin for non-Q wave MI, now ruled in NSTEMI. Will go to cath 4/15. Heparin level 0.39 now in goal range.  Goal of Therapy:  Heparin level 0.3-0.7 units/ml   Plan: Continue heparin at 1600 units/hr. Next level in am.  Pasty Spillers PharmD 06/30/2011,4:49 PM [phone]

## 2011-06-30 NOTE — H&P (Signed)
PCP:   Aida Puffer, MD, MD  Cardiology : [facility]  Chief Complaint:  Chest pains  HPI: This is a 61 year old male with history of coronary artery disease status post CABG in 2011, who states he started getting chest pains intermittently since Thursday. Chest is also right-sided, radiating down right arm. They've occurred with activity and going away with rest. They've been associated with elevated systolic blood pressures as high as 150. Shortness of breath, diaphoresis, increased lower extremity edema. Patient denies any nausea, vomiting, palpitations. Tonight he again had chest pains that woke him from sleep at 1 AM. Pain described as a 7/10 and sharp. He took a nitroglycerin and came to the ER. The pain subsided after he came to ER. In the ER labs reveal an elevated troponin, EKG without ST segment elevations. Patient did receive nitroglycerin and his systolic blood pressure dropped to 77. Nitroglycerin was discontinued. He received a 500 cc bolus of normal saline. [facility] was made aware of patient. Patient states he never gets chest pains. History provided by patient and wife is at bedside. Patient also states is compliant with medications.  Review of Systems: Positives bolded  The patient denies anorexia, fever, weight loss,, vision loss, decreased hearing, hoarseness, chest pain, syncope, dyspnea on exertion, peripheral edema, balance deficits, hemoptysis, abdominal pain, melena, hematochezia, severe indigestion/heartburn, hematuria, incontinence, genital sores, muscle weakness, suspicious skin lesions, transient blindness, difficulty walking, depression, unusual weight change, abnormal bleeding, enlarged lymph nodes, angioedema, and breast masses.  Past Medical History: Past Medical History  Diagnosis Date  . Coronary artery disease   . MI (myocardial infarction)   . Diabetes mellitus   . Hypertension   . Hyperlipidemia   . Obesity (BMI 30-39.9)     . Obstructive sleep apnea     Does not use a CPAP   Past Surgical History  Procedure Date  . Coronary artery bypass graft     May 2011    Medications: Prior to Admission medications   Medication Sig Start Date End Date Taking? Authorizing Provider  aspirin 81 MG chewable tablet Chew 81 mg by mouth daily.   Yes Historical Provider, MD  losartan (COZAAR) 50 MG tablet Take 50 mg by mouth daily.   Yes Historical Provider, MD  nitroGLYCERIN (NITROSTAT) 0.4 MG SL tablet Place 0.4 mg under the tongue every 5 (five) minutes as needed. For chest pain   Yes Historical Provider, MD  simvastatin (ZOCOR) 40 MG tablet Take 40 mg by mouth every evening.   Yes Historical Provider, MD    Allergies:   Allergies  Allergen Reactions  . Benadryl Allergy Anaphylaxis    Social History:  reports that he has never smoked. He does not have any smokeless tobacco history on file. He reports that he does not drink alcohol or use illicit drugs.  Family History: Family History  Problem Relation Age of Onset  . Diabetes type II    . Coronary artery disease      Physical Exam: Filed Vitals:   06/30/11 0300 06/30/11 0315 06/30/11 0330 06/30/11 0345  BP: 77/37 100/60 101/62 111/72  Pulse: 80 65 65 64  Temp:      TempSrc:      Resp: 20 18 18 17   Height:      Weight:      SpO2: 87% 95% 96% 96%    General:  Alert and oriented times three, obese, no acute distress Eyes: PERRLA, pink conjunctiva, no scleral icterus ENT: Moist  oral mucosa, neck supple, no thyromegaly Lungs: clear to ascultation, no wheeze, no crackles, no use of accessory muscles Cardiovascular: regular rate and rhythm, no regurgitation, no gallops, no murmurs. No carotid bruits, no JVD Abdomen: soft, positive BS, non-tender, non-distended, no organomegaly, not an acute abdomen GU: not examined Neuro: CN II - XII grossly intact, sensation intact Musculoskeletal: strength 5/5 all extremities, no clubbing, cyanosis or edema, no  reproducible chest wall pain Skin: no rash, no subcutaneous crepitation, no decubitus Psych: appropriate patient   Labs on Admission:   [facility] 06/30/11 0202  NA 136  K 4.1  CL 101  CO2 25  GLUCOSE 129*  BUN 17  CREATININE 1.19  CALCIUM 9.0  MG --  PHOS --    Basename 06/30/11 0202  AST 39*  ALT 19  ALKPHOS 80  BILITOT 0.4  PROT 7.2  ALBUMIN 3.4*   No results found for this basename: LIPASE:2,AMYLASE:2 in the last 72 hours  Basename 06/30/11 0202  WBC 10.3  NEUTROABS 5.3  HGB 14.3  HCT 43.2  MCV 85.2  PLT 199    Basename 06/30/11 0202  CKTOTAL 287*  CKMB 27.8*  CKMBINDEX --  TROPONINI 1.09*   No components found with this basename: POCBNP:3 No results found for this basename: DDIMER:2 in the last 72 hours No results found for this basename: HGBA1C:2 in the last 72 hours No results found for this basename: CHOL:2,HDL:2,LDLCALC:2,TRIG:2,CHOLHDL:2,LDLDIRECT:2 in the last 72 hours No results found for this basename: TSH,T4TOTAL,FREET3,T3FREE,THYROIDAB in the last 72 hours No results found for this basename: VITAMINB12:2,FOLATE:2,FERRITIN:2,TIBC:2,IRON:2,RETICCTPCT:2 in the last 72 hours  Micro Results: No results found for this or any previous visit (from the past 240 hour(s)).   Radiological Exams on Admission: Dg Chest Port 1 View  06/30/2011  *RADIOLOGY REPORT*  Clinical Data: Chest pain and short of breath.  Previous myocardial infarction.  PORTABLE CHEST - 1 VIEW  Comparison: 08/14/2010  Findings: Low lung volumes again noted, however both lungs are clear.  No evidence of pleural effusion.  Heart size is stable. Prior CABG again noted.  IMPRESSION: Low lung volumes.  No acute findings.  Original Report Authenticated By: Danae Orleans, M.D.    EKG: ST segment elevations  Assessment/Plan Present on Admission:  . non-Q-wave MI Status post CABG 2011 Admit to step down Loading dose of Plavix, continue daily Plavix, aspirin, heparin drip, low dose beta  blocker Nitroglycerin caused hypotension in the ER, therefore not continued. When necessary sublingual nitroglycerin ordered [facility] Dr. Timoteo Expose aware of patient. He states admit to [employer] service and cardiology will assume care in the a.m. Continue oxygen. morphine when necessary. Lipid panel ordered Gentle IV fluids  Particularly concerning as patient had open-heart surgery in 2011 Hypertension Hyperlipidemia Obesity Obstructive sleep apnea-not certain CPAP Cozaar DC'd and low-dose coreg started with hold parameters  Otherwise stable  Full code DVT prophylaxis Team 6/Dr. Brien Few  [facility] to assume care in A.m.  Aahana Elza 06/30/2011, 4:36 AM

## 2011-06-30 NOTE — ED Provider Notes (Signed)
History     CSN: [id number]  Arrival date & time 06/30/11  0138   First MD Initiated Contact with Patient 06/30/11 0202      Chief Complaint  Patient presents with  . Chest Pain    (Consider location/radiation/quality/duration/timing/severity/associated sxs/prior treatment) HPI Comments: 61 y/o male with hx of MI and CABG done 1 year ago presents with CP on the R, stabbign, intermittent X 3 days - gradually worsening, better with rest and nitro - currently having active pain and SOB.  Sx are severe.  He took nitro prior to arrival with good relief but pain has returned.   Cardiology:  Dr. Clarene Duke  The history is provided by the patient, the spouse and medical records.    Past Medical History  Diagnosis Date  . Coronary artery disease   . MI (myocardial infarction)     History reviewed. No pertinent past surgical history.  No family history on file.  History  Substance Use Topics  . Smoking status: Never Smoker   . Smokeless tobacco: Not on file  . Alcohol Use: No      Review of Systems  All other systems reviewed and are negative.    Allergies  Benadryl allergy  Home Medications   Current Outpatient Rx  Name Route Sig Dispense Refill  . ASPIRIN 81 MG PO CHEW Oral Chew 81 mg by mouth daily.    Marland Kitchen LOSARTAN POTASSIUM 50 MG PO TABS Oral Take 50 mg by mouth daily.    Marland Kitchen NITROGLYCERIN 0.4 MG SL SUBL Sublingual Place 0.4 mg under the tongue every 5 (five) minutes as needed. For chest pain    . SIMVASTATIN 40 MG PO TABS Oral Take 40 mg by mouth every evening.      BP 111/72  Pulse 64  Temp(Src) 97.7 F (36.5 C) (Oral)  Resp 17  Ht 6' (1.829 m)  Wt 320 lb (145.151 kg)  BMI 43.40 kg/m2  SpO2 96%  Physical Exam  Nursing note and vitals reviewed. Constitutional: He appears well-developed and well-nourished. No distress.  HENT:  Head: Normocephalic and atraumatic.  Mouth/Throat: Oropharynx is clear and moist. No oropharyngeal exudate.  Eyes: Conjunctivae and  EOM are normal. Pupils are equal, round, and reactive to light. Right eye exhibits no discharge. Left eye exhibits no discharge. No scleral icterus.  Neck: Normal range of motion. Neck supple. No JVD present. No thyromegaly present.  Cardiovascular: Normal rate, regular rhythm, normal heart sounds and intact distal pulses.  Exam reveals no gallop and no friction rub.   No murmur heard. Pulmonary/Chest: Effort normal and breath sounds normal. No respiratory distress. He has no wheezes. He has no rales.  Abdominal: Soft. Bowel sounds are normal. He exhibits no distension and no mass. There is no tenderness.  Musculoskeletal: Normal range of motion. He exhibits edema ( bilateral LE edema 1+). He exhibits no tenderness.  Lymphadenopathy:    He has no cervical adenopathy.  Neurological: He is alert. Coordination normal.  Skin: Skin is warm and dry. No rash noted. No erythema.  Psychiatric: He has a normal mood and affect. His behavior is normal.    ED Course  Procedures (including critical care time)  ED ECG REPORT   Date: 06/30/2011   Rate: 85  Rhythm: normal sinus rhythm  QRS Axis: left  Intervals: normal  ST/T Wave abnormalities: nonspecific ST/T changes  Conduction Disutrbances:none  Narrative Interpretation:   Old EKG Reviewed: changes noted - has Twave inversions and ST dep in 1 and  L, new c/w 07/21/10   Labs Reviewed  COMPREHENSIVE METABOLIC PANEL - Abnormal; Notable for the following:    Glucose, Bld 129 (*)    Albumin 3.4 (*)    AST 39 (*)    GFR calc non Af Amer 64 (*)    GFR calc Af Amer 74 (*)    All other components within normal limits  CARDIAC PANEL(CRET KIN+CKTOT+MB+TROPI) - Abnormal; Notable for the following:    Total CK 287 (*)    CK, MB 27.8 (*) CRITICAL VALUE NOTED.  VALUE IS CONSISTENT WITH PREVIOUSLY REPORTED AND CALLED VALUE.   Troponin I 1.09 (*)    Relative Index 9.7 (*)    All other components within normal limits  PRO B NATRIURETIC PEPTIDE - Abnormal;  Notable for the following:    Pro B Natriuretic peptide (BNP) 1224.0 (*)    All other components within normal limits  POCT I-STAT TROPONIN I - Abnormal; Notable for the following:    Troponin i, poc 1.08 (*)    All other components within normal limits  CBC  DIFFERENTIAL  APTT  PROTIME-INR   Dg Chest Port 1 View  06/30/2011  *RADIOLOGY REPORT*  Clinical Data: Chest pain and short of breath.  Previous myocardial infarction.  PORTABLE CHEST - 1 VIEW  Comparison: 08/14/2010  Findings: Low lung volumes again noted, however both lungs are clear.  No evidence of pleural effusion.  Heart size is stable. Prior CABG again noted.  IMPRESSION: Low lung volumes.  No acute findings.  Original Report Authenticated By: Danae Orleans, M.D.     1. NSTEMI (non-ST elevated myocardial infarction)       MDM  No reproducible sx, has VS which are normal, ECG which is abnormal - lateral ischemia pattern but no STEMI seen.  Cards paged due to ongoing pain and assumed ischemia - nitro gtt and heparin gtt ordered.  0225, d/w Dr. Dannette Barbara - requests hospitalist admit unless STEMI or instability develops.  D/w hospitalist - will admit.    CRITICAL CARE Performed by: Vida Roller   Total critical care time: 35  Critical care time was exclusive of separately billable procedures and treating other patients.  Critical care was necessary to treat or prevent imminent or life-threatening deterioration.  Critical care was time spent personally by me on the following activities: development of treatment plan with patient and/or surrogate as well as nursing, discussions with consultants, evaluation of patient's response to treatment, examination of patient, obtaining history from patient or surrogate, ordering and performing treatments and interventions, ordering and review of laboratory studies, ordering and review of radiographic studies, pulse oximetry and re-evaluation of patient's condition.   Vida Roller,  MD 06/30/11 [phone]

## 2011-06-30 NOTE — Progress Notes (Signed)
ANTICOAGULATION CONSULT NOTE - Initial Consult  Pharmacy Consult for heparin Indication: Ruled in NSTEMI  Allergies  Allergen Reactions  . Benadryl Allergy Anaphylaxis    Patient Measurements: Height: 6' (182.9 cm) Weight: 337 lb 1.3 oz (152.9 kg) IBW/kg (Calculated) : 77.6   Vital Signs: Temp: 98.3 F (36.8 C) (04/14 0600) Temp src: Oral (04/14 0600) BP: 111/69 mmHg (04/14 0700) Pulse Rate: 73  (04/14 0800)  Labs:  Alvira Philips 06/30/11 0839 06/30/11 0829 06/30/11 0202  HGB -- -- 14.3  HCT -- -- 43.2  PLT -- -- 199  APTT -- -- 31  LABPROT -- -- 14.0  INR -- -- 1.06  HEPARINUNFRC 0.28* -- --  CREATININE -- -- 1.19  CKTOTAL -- -- 287*  CKMB -- -- 27.8*  TROPONINI -- 6.39* 1.09*   Estimated Creatinine Clearance: 99.3 ml/min (by C-G formula based on Cr of 1.19).  Medical History: Past Medical History  Diagnosis Date  . Coronary artery disease   . MI (myocardial infarction)   . Diabetes mellitus   . Hypertension   . Hyperlipidemia   . Obesity (BMI 30-39.9)   . Obstructive sleep apnea     Does not use a CPAP    Assessment: 61yo male with prior CAD c/o intermittent CP radiating to R arm since Thursday, initial troponin elevated, began heparin for non-Q wave MI, now ruled in NSTEMI. Will go to cath 4/15. HL 0.28 this AM, no signs of bleeding per RN.  Goal of Therapy:  Heparin level 0.3-0.7 units/ml   Plan:  1. Heparin bolus 1000 units 2. Increase heparin to 1600 units/hour 3. F/U 1600 HL 4. Daily HL/CBC  Maple Odaniel, [place] R PharmD 06/30/2011,10:22 AM [phone]

## 2011-06-30 NOTE — Progress Notes (Signed)
CRITICAL VALUE ALERT  Critical value received:  5 beat run of vtach   Date of notification:  06/30/2011   Time of notification:  11:17 AM   Critical value read back:yes  Nurse who received alert:  Desiree Lucy   MD notified (1st page):  Lisabeth Register, pa  Time of first page:  11:17 AM   MD notified (2nd page):  Time of second page:  Responding MD:  Diona Fanti, pa  Time MD responded:  11:19 AM Pt was asymptomatic hr 65 bp 99/66 pt unaware. No c/o cp.Will continue to monitor and advise attending as needed.

## 2011-06-30 NOTE — Consult Note (Signed)
Pt. Seen and examined. Agree with the NP/PA-C note as written.  Gregory Wiley seems to be having typical angina symptoms and has ruled-in for NSTEMI. There are high-lateral TWI's on EKG, which could be the portion of the SVG-acute marginal. Chest pain improved with nitroglycerin. Mild CHF by labs, but he can lay flat. Will diurese today. Plan NPO p MN for LHC/possible PCI tomorrow.  He has been loaded on plavix and is on heparin.   Will will assume care on our service. Appreciate [facility] excellent care overnight.  Chrystie Nose, MD, [facility] Attending Cardiologist [facility]

## 2011-06-30 NOTE — Consult Note (Signed)
Reason for Consult: NSTEMI  Requesting Physician: [facility]  HPI: This is a 61 y.o. male with a past medical history significant for CAD. He had an RCA PCI 11/09, 10/11, and ultimately CABG X 4 with L-LAD, S-AM,S-PDA/RCA. He had an EF 25% at the time of surgery but this improved to 35-40% by 2D 5/12. LOV with Dr Clarene Duke was Oct 2012. He developed SSCP Thursday while at work. Sx's recurred Friday and again at 1am today and he came to the ER. He took a NTG and his symptoms improved by the time he got here. His EKG showed new TWI lead 1, AVL, and V2. His troponin was 1.07. His BNP is also elevated at 1200. His symptoms are SSCP with radiation to his Rt arm. This am he became diaphoretic with his pain.  PMHx:  Past Medical History  Diagnosis Date  . Coronary artery disease   . MI (myocardial infarction)   . Diabetes mellitus   . Hypertension   . Hyperlipidemia   . Obesity (BMI 30-39.9)   . Obstructive sleep apnea     Does not use a CPAP   Past Surgical History  Procedure Date  . Coronary artery bypass graft     May 2011    FAMHx: Family History  Problem Relation Age of Onset  . Diabetes type II    . Coronary artery disease      SOCHx:  reports that he has never smoked. He does not have any smokeless tobacco history on file. He reports that he does not drink alcohol or use illicit drugs.He is married, 5 children, 6 grandchildren. He does not smoke. [place] companies.  ALLERGIES: Allergies  Allergen Reactions  . Benadryl Allergy Anaphylaxis    ROS: A comprehensive review of systems was negative. He has chronic sternal non union  HOME MEDICATIONS: Prescriptions prior to admission  Medication Sig Dispense Refill  . aspirin 81 MG chewable tablet Chew 81 mg by mouth daily.      Marland Kitchen losartan (COZAAR) 50 MG tablet Take 50 mg by mouth daily.      . nitroGLYCERIN (NITROSTAT) 0.4 MG SL tablet Place 0.4 mg under the tongue every 5 (five) minutes  as needed. For chest pain      . simvastatin (ZOCOR) 40 MG tablet Take 40 mg by mouth every evening.        HOSPITAL MEDICATIONS: I have reviewed the patient's current medications.  VITALS: Blood pressure 111/69, pulse 73, temperature 98.3 F (36.8 C), temperature source Oral, resp. rate 17, height 6' (1.829 m), weight 152.9 kg (337 lb 1.3 oz), SpO2 92.00%.  PHYSICAL EXAM: General appearance: alert, cooperative, no distress and morbidly obese Neck: no carotid bruit, no JVD, supple, symmetrical, trachea midline and thyroid not enlarged, symmetric, no tenderness/mass/nodules Lungs: clear to auscultation bilaterally Heart: regular rate and rhythm Abdomen: obese Extremities: extremities normal, atraumatic, no cyanosis or edema Pulses: 2+ and symmetric Skin: Skin color, texture, turgor normal. No rashes or lesions Neurologic: Grossly normal  LABS: Results for orders placed during the hospital encounter of 06/30/11 (from the past 48 hour(s))  CBC     Status: Normal   Collection Time   06/30/11  2:02 AM      Component Value Range Comment   WBC 10.3  4.0 - 10.5 (K/uL)    RBC 5.07  4.22 - 5.81 (MIL/uL)    Hemoglobin 14.3  13.0 - 17.0 (g/dL)    HCT 43.2  39.0 - 52.0 (%)    MCV 85.2  78.0 - 100.0 (fL)    MCH 28.2  26.0 - 34.0 (pg)    MCHC 33.1  30.0 - 36.0 (g/dL)    RDW 16.1  09.6 - 04.5 (%)    Platelets 199  150 - 400 (K/uL)   DIFFERENTIAL     Status: Normal   Collection Time   06/30/11  2:02 AM      Component Value Range Comment   Neutrophils Relative 52  43 - 77 (%)    Neutro Abs 5.3  1.7 - 7.7 (K/uL)    Lymphocytes Relative 34  12 - 46 (%)    Lymphs Abs 3.5  0.7 - 4.0 (K/uL)    Monocytes Relative 9  3 - 12 (%)    Monocytes Absolute 0.9  0.1 - 1.0 (K/uL)    Eosinophils Relative 5  0 - 5 (%)    Eosinophils Absolute 0.5  0.0 - 0.7 (K/uL)    Basophils Relative 0  0 - 1 (%)    Basophils Absolute 0.0  0.0 - 0.1 (K/uL)   COMPREHENSIVE METABOLIC PANEL     Status: Abnormal    Collection Time   06/30/11  2:02 AM      Component Value Range Comment   Sodium 136  135 - 145 (mEq/L)    Potassium 4.1  3.5 - 5.1 (mEq/L)    Chloride 101  96 - 112 (mEq/L)    CO2 25  19 - 32 (mEq/L)    Glucose, Bld 129 (*) 70 - 99 (mg/dL)    BUN 17  6 - 23 (mg/dL)    Creatinine, Ser 4.09  0.50 - 1.35 (mg/dL)    Calcium 9.0  8.4 - 10.5 (mg/dL)    Total Protein 7.2  6.0 - 8.3 (g/dL)    Albumin 3.4 (*) 3.5 - 5.2 (g/dL)    AST 39 (*) 0 - 37 (U/L)    ALT 19  0 - 53 (U/L)    Alkaline Phosphatase 80  39 - 117 (U/L)    Total Bilirubin 0.4  0.3 - 1.2 (mg/dL)    GFR calc non Af Amer 64 (*) >90 (mL/min)    GFR calc Af Amer 74 (*) >90 (mL/min)   APTT     Status: Normal   Collection Time   06/30/11  2:02 AM      Component Value Range Comment   aPTT 31  24 - 37 (seconds)   PROTIME-INR     Status: Normal   Collection Time   06/30/11  2:02 AM      Component Value Range Comment   Prothrombin Time 14.0  11.6 - 15.2 (seconds)    INR 1.06  0.00 - 1.49    CARDIAC PANEL(CRET KIN+CKTOT+MB+TROPI)     Status: Abnormal   Collection Time   06/30/11  2:02 AM      Component Value Range Comment   Total CK 287 (*) 7 - 232 (U/L)    CK, MB 27.8 (*) 0.3 - 4.0 (ng/mL) CRITICAL VALUE NOTED.  VALUE IS CONSISTENT WITH PREVIOUSLY REPORTED AND CALLED VALUE.   Troponin I 1.09 (*) <0.30 (ng/mL)    Relative Index 9.7 (*) 0.0 - 2.5    PRO B NATRIURETIC PEPTIDE     Status: Abnormal   Collection Time   06/30/11  2:02 AM      Component Value Range Comment   Pro B Natriuretic peptide (BNP) 1224.0 (*) 0 - 125 (  pg/mL)   POCT I-STAT TROPONIN I     Status: Abnormal   Collection Time   06/30/11  2:18 AM      Component Value Range Comment   Troponin i, poc 1.08 (*) 0.00 - 0.08 (ng/mL)    Comment NOTIFIED PHYSICIAN      Comment 3            MRSA PCR SCREENING     Status: Normal   Collection Time   06/30/11  5:52 AM      Component Value Range Comment   MRSA by PCR NEGATIVE  NEGATIVE      IMAGING: Dg Chest Port 1  View  06/30/2011  *RADIOLOGY REPORT*  Clinical Data: Chest pain and short of breath.  Previous myocardial infarction.  PORTABLE CHEST - 1 VIEW  Comparison: 08/14/2010  Findings: Low lung volumes again noted, however both lungs are clear.  No evidence of pleural effusion.  Heart size is stable. Prior CABG again noted.  IMPRESSION: Low lung volumes.  No acute findings.  Original Report Authenticated By: Danae Orleans, M.D.    IMPRESSION: Active Problems:  NSTEMI, 06/29/11  CAD, RCA PCI '09, 10/11, CABG X 4 5/12  Ischemic cardiomyopathy, EF 35-40 2D May 2012  CHF, acute, mild  Hypertension  Hyperlipidemia  Obstructive sleep apnea, C-pap intol  Morbid obesity  Sternal manubrial dissociation with nonunion   RECOMMENDATION: MD to see, will arrange cath for Monday. Lasix X 1 today. IV NTG had to be stopped secondary to low B/P. Continue Heparin.  Time Spent Directly with Patient: 40 minutes  Lucillie Kiesel K 06/30/2011, 8:15 AM

## 2011-06-30 NOTE — ED Notes (Addendum)
BP at 0300 77/52,  MD notified and nitro drip stopped and will cont to monitor.

## 2011-06-30 NOTE — ED Notes (Signed)
The pt has had rt sided chest pain with rt arm radiation since Thursday intermittently.  He had a sl nitro that helped the pain

## 2011-06-30 NOTE — Progress Notes (Signed)
Report from Night RN. Chart reviewed together. Handoff complete.  

## 2011-06-30 NOTE — ED Notes (Signed)
Aspirin 325mg  at 2300

## 2011-06-30 NOTE — Progress Notes (Signed)
ANTICOAGULATION CONSULT NOTE - Initial Consult  Pharmacy Consult for heparin Indication: NQWMI  Allergies  Allergen Reactions  . Benadryl Allergy Anaphylaxis    Patient Measurements: Height: 6' (182.9 cm) Weight: 320 lb (145.151 kg) IBW/kg (Calculated) : 77.6   Vital Signs: Temp: 97.7 F (36.5 C) (04/14 0143) Temp src: Oral (04/14 0143) BP: 109/69 mmHg (04/14 0500) Pulse Rate: 61  (04/14 0500)  Labs:  Basename 06/30/11 0202  HGB 14.3  HCT 43.2  PLT 199  APTT 31  LABPROT 14.0  INR 1.06  HEPARINUNFRC --  CREATININE 1.19  CKTOTAL 287*  CKMB 27.8*  TROPONINI 1.09*   Estimated Creatinine Clearance: 96.4 ml/min (by C-G formula based on Cr of 1.19).  Medical History: Past Medical History  Diagnosis Date  . Coronary artery disease   . MI (myocardial infarction)   . Diabetes mellitus   . Hypertension   . Hyperlipidemia   . Obesity (BMI 30-39.9)   . Obstructive sleep apnea     Does not use a CPAP    Assessment: 61yo male with prior CAD c/o intermittent CP radiating to R arm since Thursday, initial troponin elevated, to begin heparin for non-Q wave MI.  Goal of Therapy:  Heparin level 0.3-0.7 units/ml   Plan:  Rec'd heparin 4000 units IV bolus and gtt at 1400 units/hr in ED; will continue at current rate and monitor heparin levels and CBC.  Colleen Can PharmD BCPS 06/30/2011,5:42 AM

## 2011-07-01 ENCOUNTER — Encounter (HOSPITAL_COMMUNITY)
Admission: EM | Disposition: A | Discharge status: Home / Self Care | Payer: Self-pay | Source: Home / Self Care | Attending: Internal Medicine

## 2011-07-01 HISTORY — PX: CORONARY ANGIOPLASTY WITH STENT PLACEMENT: SHX49

## 2011-07-01 HISTORY — PX: LEFT HEART CATHETERIZATION WITH CORONARY/GRAFT ANGIOGRAM: SHX5450

## 2011-07-01 HISTORY — PX: PERCUTANEOUS CORONARY STENT INTERVENTION (PCI-S): SHX5485

## 2011-07-01 LAB — BASIC METABOLIC PANEL
BUN: 13 mg/dL (ref 6–23)
CO2: 24 mEq/L (ref 19–32)
Calcium: 8.5 mg/dL (ref 8.4–10.5)
Chloride: 102 mEq/L (ref 96–112)
Creatinine, Ser: 0.92 mg/dL (ref 0.50–1.35)
GFR calc Af Amer: >90 mL/min (ref >90)
GFR calc non Af Amer: 89 mL/min — ABNORMAL LOW (ref >90)
Glucose, Bld: 116 mg/dL — ABNORMAL HIGH (ref 70–99)
Potassium: 3.6 mEq/L (ref 3.5–5.1)
Sodium: 134 mEq/L — ABNORMAL LOW (ref 135–145)

## 2011-07-01 LAB — CBC
HCT: 42.1 % (ref 39.0–52.0)
Hemoglobin: 14 g/dL (ref 13.0–17.0)
MCH: 28.3 pg (ref 26.0–34.0)
MCHC: 33.3 g/dL (ref 30.0–36.0)
MCV: 85.2 fL (ref 78.0–100.0)
Platelets: 171 10*3/uL (ref 150–400)
RBC: 4.94 MIL/uL (ref 4.22–5.81)
RDW: 13.7 % (ref 11.5–15.5)
WBC: 10.1 10*3/uL (ref 4.0–10.5)

## 2011-07-01 LAB — POCT ACTIVATED CLOTTING TIME
Activated Clotting Time: 133 seconds
Activated Clotting Time: 600 seconds

## 2011-07-01 LAB — GLUCOSE, CAPILLARY
Glucose-Capillary: 110 mg/dL — ABNORMAL HIGH (ref 70–99)
Glucose-Capillary: 90 mg/dL (ref 70–99)
Glucose-Capillary: 110 mg/dL — ABNORMAL HIGH (ref 70–99)

## 2011-07-01 LAB — HEPARIN LEVEL (UNFRACTIONATED): Heparin Unfractionated: 0.32 IU/mL (ref 0.30–0.70)

## 2011-07-01 SURGERY — LEFT HEART CATHETERIZATION WITH CORONARY/GRAFT ANGIOGRAM
Anesthesia: LOCAL | Site: Groin | Laterality: Right

## 2011-07-01 MED ORDER — MIDAZOLAM HCL 2 MG/2ML IJ SOLN
INTRAMUSCULAR | Status: AC
  Filled 2011-07-01: qty 2

## 2011-07-01 MED ORDER — LIDOCAINE HCL (PF) 1 % IJ SOLN
INTRAMUSCULAR | Status: AC
  Filled 2011-07-01: qty 30

## 2011-07-01 MED ORDER — HEPARIN SODIUM (PORCINE) 1000 UNIT/ML IJ SOLN
INTRAMUSCULAR | Status: AC
  Filled 2011-07-01: qty 1

## 2011-07-01 MED ORDER — ACETAMINOPHEN 325 MG PO TABS: 650.0000 mg | ORAL_TABLET | ORAL | Status: DC | PRN

## 2011-07-01 MED ORDER — NITROGLYCERIN 0.2 MG/ML ON CALL CATH LAB
INTRAVENOUS | Status: AC
  Filled 2011-07-01: qty 1

## 2011-07-01 MED ORDER — ASPIRIN EC 325 MG PO TBEC
325.0000 mg | DELAYED_RELEASE_TABLET | Freq: Every day | ORAL | Status: DC
Start: 2011-07-02 — End: 2011-07-03
  Administered 2011-07-03: 325 mg via ORAL
  Filled 2011-07-01 (×2): qty 1

## 2011-07-01 MED ORDER — HEPARIN (PORCINE) IN NACL 2-0.9 UNIT/ML-% IJ SOLN
INTRAMUSCULAR | Status: AC
  Filled 2011-07-01: qty 2000

## 2011-07-01 MED ORDER — CLOPIDOGREL BISULFATE 75 MG PO TABS: 75.0000 mg | ORAL_TABLET | Freq: Every day | ORAL | Status: DC

## 2011-07-01 MED ORDER — ONDANSETRON HCL 4 MG/2ML IJ SOLN: 4.0000 mg | Freq: Four times a day (QID) | INTRAMUSCULAR | Status: DC | PRN

## 2011-07-01 MED ORDER — CLOPIDOGREL BISULFATE 75 MG PO TABS
ORAL_TABLET | ORAL | Status: AC
  Filled 2011-07-01: qty 1

## 2011-07-01 MED ORDER — FENTANYL CITRATE 0.05 MG/ML IJ SOLN
INTRAMUSCULAR | Status: AC
  Filled 2011-07-01: qty 2

## 2011-07-01 MED ORDER — CLOPIDOGREL BISULFATE 75 MG PO TABS
75.0000 mg | ORAL_TABLET | Freq: Every day | ORAL | Status: DC
  Administered 2011-07-02 – 2011-07-03 (×2): 75 mg via ORAL
  Filled 2011-07-01 (×2): qty 1

## 2011-07-01 MED ORDER — BIVALIRUDIN 250 MG IV SOLR
INTRAVENOUS | Status: AC
  Filled 2011-07-01: qty 250

## 2011-07-01 MED ORDER — SODIUM CHLORIDE 0.9 % IV SOLN
INTRAVENOUS | Status: DC
  Administered 2011-07-01 (×2): via INTRAVENOUS

## 2011-07-01 MED ORDER — SODIUM CHLORIDE 0.9 % IV SOLN
0.2500 mg/kg/h | INTRAVENOUS | Status: AC
  Administered 2011-07-01: 0.25 mg/kg/h via INTRAVENOUS

## 2011-07-01 MED ORDER — FENTANYL CITRATE 0.05 MG/ML IJ SOLN
INTRAMUSCULAR | Status: AC
Start: 2011-07-01 — End: 2011-07-01
  Filled 2011-07-01: qty 2

## 2011-07-01 NOTE — CV Procedure (Signed)
CARDIAC CATHETERIZATION REPORT  Khai, Arrona  Male, 61 y.o., Oct 11, 1950 MRN: [id number]  Procedures performed:  1. Left heart catheterization  2. Selective coronary angiography  3. Selective angiography of left internal mammary artery bypass graft 2 the LAD 4. Selective angiography of saphenous vein graft bypasses to the right coronary artery   Reason for procedure:  Acute non- ST segment elevation myocardial infarction   Procedure performed by: Thurmon Fair, MD, [facility]  Complications: none   Estimated blood loss: less than 5 mL   History:  61 year old man with extensive history of coronary artery disease status post remote repeat stenting of the right coronary artery and one year status post four-vessel bypass surgery (LIMA to LAD, sequential SVG to distal RCA and PDA, SVG to acute marginal branch of RCA) presents with roughly 4 days of unstable angina and ruled in for non-ST elevation myocardial infarction. Prominent ST-T wave changes are seen in the lateral leads. The peak troponin was 22.  Consent: The risks, benefits, and details of the procedure were explained to the patient. Risks including death, MI, stroke, bleeding, limb ischemia, renal failure and allergy were described and accepted by the patient. Informed written consent was obtained prior to proceeding.  Technique: The patient was brought to the cardiac catheterization laboratory in the fasting state. He was prepped and draped in the usual sterile fashion. Local anesthesia with 1% lidocaine was administered to the right groin area. Using the modified Seldinger technique a 5 French right common femoral artery sheath was introduced without difficulty. Under fluoroscopic guidance, using 5 [place] JL4, JR and angled pigtail catheters, selective cannulation of the left coronary artery, right coronary artery, LIMA bypass and SVG bypasses and left ventricle were respectively performed. Several coronary angiograms in a variety of  projections were recorded, but a left ventriculogram was not performed to reduce dye load. Left ventricular pressure and a pull back to the aorta were recorded. No immediate complications occurred. At the end of the procedure, all catheters were removed. After the procedure, hemostasis will be achieved with manual pressure.  Contrast used: 100 mL Omnipaque  Angiographic Findings:  1. The left main coronary artery is free of significant atherosclerosis and bifurcates in the usual fashion into the left anterior descending artery and left circumflex coronary artery.  2. The left anterior descending artery is totally occluded at the ostium. 3. The left circumflex coronary artery is a large-size vessel non- dominant vessel that generates 1 major oblique marginal artery. There is evidence of a severe, eccentric and ulcerated plaque in the proximal to mid vessel. The diameter stenosis is at least 95%. However there is currently TIMI-3 flow. 4. The right coronary artery is totally occluded almost immediately following the ostium.  5. The left ventricular end-diastolic pressure is 22 mm Hg. There is no evidence of aortic stenosis by pullback to the aorta. 6.  The left internal mammary artery bypass to the mid LAD is widely patent. Upstream anastomosis site to the LAD is occluded. Downstream of the anastomosis there are 2 smooth sequential 70% stenoses. 7.   The saphenous vein graft to the acute marginal branch of the right coronary artery is totally occluded at the ostium. 8.  The sequential saphenous vein graft to the distal right coronary artery and posterior descending artery is widely patent in both limbs. The posterior descending artery is free of meaningful stenoses although it is diffusely diseased. The distal right coronary artery just beyond the anastomosis but before the posterior lateral ventricular branch has  an irregular 80% stenosis   IMPRESSIONS:  Significant progression of coronary disease  since last cardiac catheterization as noted. The culprit vessel for his non-ST segment elevation myocardial infarction is the native left circumflex coronary artery. This is supported by both the location of the EKG changes in the angiographic appearance. RECOMMENDATION:  Percutaneous revascularization of the native left circumflex coronary artery. Consider staged revascularization of the distal right coronary artery/posterior lateral ventricular branch if there is evidence of ischemia by noninvasive tests.

## 2011-07-01 NOTE — Progress Notes (Signed)
Sheath pull 2917. Right groin. manual pressure held for 20 mins. Level 0. Instructions given. Patient tolerated. HR - SR 60's, BP - 90's / 50's. 98% on 2 l/min. Nurse looked at groin post pull.   Riki Rusk Perez Dirico,RCIS. 07/01/2011 16:17.

## 2011-07-01 NOTE — Progress Notes (Addendum)
[facility]  Subjective: Angina and dyspnea resolved.  Objective: Vital signs in last 24 hours: Temp:  [97.9 F (36.6 C)-98.4 F (36.9 C)] 97.9 F (36.6 C) (04/15 0728) Pulse Rate:  [65-86] 67  (04/15 0700) Resp:  [14-23] 18  (04/15 0700) BP: (79-118)/(23-75) 97/45 mmHg (04/15 0700) SpO2:  [91 %-99 %] 95 % (04/15 0700) Weight:  [152.9 kg (337 lb 1.3 oz)] 152.9 kg (337 lb 1.3 oz) (04/14 0921) Last BM Date: 06/29/11  Intake/Output from previous day: 04/14 0701 - 04/15 0700 In: 641.7 [P.O.:120; I.V.:521.7] Out: 1900 [Urine:1900] Intake/Output this shift:    Medications Current Facility-Administered Medications  Medication Dose Route Frequency Provider Last Rate Last Dose  . 0.45 % sodium chloride infusion   Intravenous Continuous Eda Paschal [place], [place]      . 0.45 % sodium chloride infusion   Intravenous Continuous Kerry Hough, PA 50 mL/hr at 07/01/11 1610    . 0.9 %  sodium chloride infusion  250 mL Intravenous PRN Abelino Derrick, PA      . 0.9 %  sodium chloride infusion   Intravenous Continuous Chrystie Nose, MD      . acetaminophen (TYLENOL) tablet 650 mg  650 mg Oral Q6H PRN Debby Crosley, MD   650 mg at 06/30/11 1524   Or  . acetaminophen (TYLENOL) suppository 650 mg  650 mg Rectal Q6H PRN Debby Crosley, MD      . alum & mag hydroxide-simeth (MAALOX/MYLANTA) 200-200-20 MG/5ML suspension 30 mL  30 mL Oral Q6H PRN Debby Crosley, MD      . aspirin chewable tablet 324 mg  324 mg Oral [address] [place], [place]   324 mg at 07/01/11 0517  . aspirin tablet 325 mg  325 mg Oral Daily Debby Crosley, MD   325 mg at 06/30/11 1207  . carvedilol (COREG) tablet 3.125 mg  3.125 mg Oral BID WC Debby Crosley, MD   3.125 mg at 06/30/11 0722  . clopidogrel (PLAVIX) tablet 75 mg  75 mg Oral Daily Debby Crosley, MD   75 mg at 06/30/11 1208  . diazepam (VALIUM) tablet 5 mg  5 mg Oral On Call Abelino Derrick, [place]      . heparin ADULT infusion 100 units/mL (25000  units/250 mL)  1,600 Units/hr Intravenous Continuous Chrystie Nose, MD 16 mL/hr at 07/01/11 0752 1,600 Units/hr at 07/01/11 0752  . heparin bolus via infusion 1,000 Units  1,000 Units Intravenous Once Chrystie Nose, MD   1,000 Units at 06/30/11 1015  . HYDROcodone-acetaminophen (NORCO) 5-325 MG per tablet 1-2 tablet  1-2 tablet Oral Q4H PRN Debby Crosley, MD      . insulin aspart (novoLOG) injection 0-15 Units  0-15 Units Subcutaneous TID WC Abelino Derrick, PA   3 Units at 06/30/11 1210  . morphine 2 MG/ML injection 1 mg  1 mg Intravenous Q4H PRN Debby Crosley, MD      . nitroGLYCERIN (NITROGLYN) 2 % ointment 0.5 inch  0.5 inch Topical Q8H Abelino Derrick, PA   0.5 inch at 07/01/11 0514  . nitroGLYCERIN (NITROSTAT) SL tablet 0.4 mg  0.4 mg Sublingual Q5 min PRN Debby Crosley, MD      . ondansetron (ZOFRAN) tablet 4 mg  4 mg Oral Q6H PRN Debby Crosley, MD       Or  . ondansetron (ZOFRAN) injection 4 mg  4 mg Intravenous Q6H PRN Gery Pray, MD      .  pantoprazole (PROTONIX) EC tablet 40 mg  40 mg Oral Q0600 Eda Paschal [place], [place]   40 mg at 07/01/11 0520  . simvastatin (ZOCOR) tablet 40 mg  40 mg Oral QPM Debby Crosley, MD   40 mg at 06/30/11 1726  . sodium chloride 0.9 % injection 3 mL  3 mL Intravenous PRN Abelino Derrick, PA      . zolpidem (AMBIEN) tablet 5 mg  5 mg Oral QHS PRN Gery Pray, MD      . DISCONTD: 0.9 % NaCl with KCl 20 mEq/ L  infusion   Intravenous Continuous Debby Crosley, MD 75 mL/hr at 06/30/11 0723      General appearance: alert, cooperative, no distress and moderately obese Neck: no adenopathy, no carotid bruit, no JVD, supple, symmetrical, trachea midline and thyroid not enlarged, symmetric, no tenderness/mass/nodules Lungs: clear to auscultation bilaterally Heart: regular rate and rhythm, S1, S2 normal, no murmur, click, rub or gallop Abdomen: soft, non-tender; bowel sounds normal; no masses,  no organomegaly Extremities: extremities normal, atraumatic, no cyanosis or  edema Pulses: 2+ and symmetric Skin: Skin color, texture, turgor normal. No rashes or lesions Neurologic: Grossly normal  Lab Results:   Basename 06/30/11 2349 06/30/11 0202  WBC 10.1 10.3  HGB 14.0 14.3  HCT 42.1 43.2  PLT 171 199   BMET  Basename 06/30/11 2349 06/30/11 0202  NA 134* 136  K 3.6 4.1  CL 102 101  CO2 24 25  GLUCOSE 116* 129*  BUN 13 17  CREATININE 0.92 1.19  CALCIUM 8.5 9.0   PT/INR  Basename 06/30/11 0202  LABPROT 14.0  INR 1.06   Cholesterol  Basename 06/30/11 0829  CHOL 133   Cardiac Enzymes Troponin 1.09>>6.39>>22.30  Assessment/Plan   Active Problems:  CAD, RCA PCI '09, 10/11, CABG X 4 5/12  Hypertension  Hyperlipidemia  Obstructive sleep apnea, C-pap intol  Morbid obesity  NSTEMI, 06/29/11  Sternal manubrial dissociation with nonunion  Ischemic cardiomyopathy, EF 35-40 2D May 2012  CHF, acute, mild  Plan:  Troponin 1.09 to 22.30.  BNP 1224.0  Five Beat V-Tach Yesterday.  SCr WNL.  BP soft: 79/23 - 118/75.  Net fluids Nega 1.5L.  Being hydrated with 0.45%NS @ 2ml/Hr.  Lateral T wave inversion on EKG.  Left Heart Cath today.     LOS: 1 day    HAGER,BRYAN W 07/01/2011 8:21 AM  I have seen and examined the patient along with HAGER,BRYAN W, PA.  I have reviewed the chart, notes and new data.  I agree with PA's note.  Key new complaints: now asymptomatic Key examination changes: no signs of HF Key new findings / data: marked and gradual troponin rise suggests occluded graft. Note new and prominent anterolateral ST-T changes on ECG.  PLAN: Cardiac cath and possible PCI via femoral approach today. Risks and benefits discussed. He agrees to proceed.  Thurmon Fair, MD, [facility] [phone] 07/01/2011, 8:42 AM

## 2011-07-01 NOTE — CV Procedure (Signed)
Percutaneous Coronary Intervention  Gregory Wiley, 61 y.o., male  Full note dictated;  See diagram  DICTATION # [id number] , [id number]  Successful PCI of 99% proximal LCX stenosis to 0 with 4.0 x 18 mm Resolute DES stent post dilated to 4.5 mm.  [name], MD, [facility] 07/01/2011 12:37 PM

## 2011-07-01 NOTE — Cardiovascular Report (Signed)
NAME:  Gregory Wiley, FLUKE NO.:  [id number]  MEDICAL RECORD NO.:  [id number]  LOCATION:  2917                         FACILITY:  [facility]  PHYSICIAN:  Nicki Guadalajara, M.D.     DATE OF BIRTH:  [birth year]  DATE OF PROCEDURE:  07/01/2011 DATE OF DISCHARGE:                           CARDIAC CATHETERIZATION   This is a percutaneous coronary intervention note.  INDICATIONS:  Gregory Wiley is a 61 year old gentleman who is status post remote stenting of his right coronary artery and in May 2011, underwent CABG revascularization surgery, with a LIMA to the LAD, sequential vein graft to the distal RCA and PDA, and SVG to the acute marginal branch of the right coronary artery.  His circumflex was not bypassed.  He presented to [facility] with 4 days of unstable angina and ruled in for non ST-segment elevation myocardial infarction.  Peak troponin was 22.  Cardiac catheterization was done this morning by Dr. Thurmon Fair.  Please refer to his catheterization report.  With the finding of a 99% stenosis in a very large circumflex system, he now presents for percutaneous coronary intervention to the circumflex vessel.  Upon arrival to the catheterization laboratory from the holding area, the 5-French sheath was still in place from the diagnostic catheter. Using double-glove technique, the patient was prepped and draped in usual fashion.  The 5-French sheath was upgraded to a 6-French sheath. The outer gloves were then removed.  The patient had been loaded with Plavix apparently yesterday.  He received an additional 75 mg of Plavix in the lab today and had received 75 mg earlier today.  Bivalirudin was administered in bolus plus infusion.  ACT was documented to be therapeutic.  A 6-French XB 3.5 guide was used for the procedure.  A Prowater wire was advanced down the circumflex vessel.  Predilatation was done utilizing a 3.0 x 15-mm Emerge balloon.  Since the vessel was very  large, a 4.0 x 18-mm Resolute DES stent was inserted and dilated x2 up to 13 atmospheres.  A 4.5 x 15-mm noncompliant Quantum balloon was used for poststent dilatation up to approximately 4.5 mm.  Scouty angiography confirmed an excellent angiographic result.  The patient did receive IC nitroglycerin.  At the start of the procedure, he had received 2 mg of Versed plus 50 mcg of fentanyl.  The arterial sheath was sutured in place with plans for sheath removal later today.  HEMODYNAMIC DATA:  Central aortic pressure was 98/53.  ANGIOGRAPHIC DATA:  Please refer to Dr. Erin Hearing diagnostic cardiac catheterization report.  The circumflex vessel was a very large vessel that was greater than 4.0 mm.  There was 99% eccentric stenosis in the proximal portion. Following predilatation with a 3.0 balloon, stenting with a 4.0 x 18-mm Resolute DES stents with poststent dilatation up to 4.5 mm, the 99% stenosis was reduced to 0%.  There was brisk TIMI-3 flow.  There was no evidence for dissection.  IMPRESSION: 1. Successful percutaneous coronary intervention to 99% proximal left     circumflex stenosis with percutaneous     transluminal coronary angioplasty/stenting with a 4.0 x 18-mm     Resolute DES stent postdilated to  4.5 mm with the 99% stenosis     being reduced to 0%. 2. Bivalirudin/Plavix/IC nitroglycerin.          ______________________________ Nicki Guadalajara, M.D.     TK/MEDQ  D:  07/01/2011  T:  07/01/2011  Job:  [id number]  cc:   Gery Pray, MD Thurmon Fair, MD

## 2011-07-02 DIAGNOSIS — Z95820 Peripheral vascular angioplasty status with implants and grafts: Secondary | ICD-10-CM

## 2011-07-02 DIAGNOSIS — G4730 Sleep apnea, unspecified: Secondary | ICD-10-CM | POA: Diagnosis present

## 2011-07-02 LAB — CBC
HCT: 40.9 % (ref 39.0–52.0)
Hemoglobin: 13.4 g/dL (ref 13.0–17.0)
MCH: 28.1 pg (ref 26.0–34.0)
MCHC: 32.8 g/dL (ref 30.0–36.0)
MCV: 85.7 fL (ref 78.0–100.0)
Platelets: 167 10*3/uL (ref 150–400)
RBC: 4.77 MIL/uL (ref 4.22–5.81)
RDW: 13.5 % (ref 11.5–15.5)
WBC: 9.5 10*3/uL (ref 4.0–10.5)

## 2011-07-02 LAB — BASIC METABOLIC PANEL
BUN: 12 mg/dL (ref 6–23)
CO2: 22 mEq/L (ref 19–32)
Calcium: 8.5 mg/dL (ref 8.4–10.5)
Chloride: 101 mEq/L (ref 96–112)
Creatinine, Ser: 0.95 mg/dL (ref 0.50–1.35)
GFR calc Af Amer: >90 mL/min (ref >90)
GFR calc non Af Amer: 88 mL/min — ABNORMAL LOW (ref >90)
Glucose, Bld: 89 mg/dL (ref 70–99)
Potassium: 3.6 mEq/L (ref 3.5–5.1)
Sodium: 135 mEq/L (ref 135–145)

## 2011-07-02 LAB — GLUCOSE, CAPILLARY
Glucose-Capillary: 99 mg/dL (ref 70–99)
Glucose-Capillary: 89 mg/dL (ref 70–99)

## 2011-07-02 MED ORDER — ISOSORBIDE MONONITRATE 15 MG HALF TABLET
15.0000 mg | ORAL_TABLET | Freq: Every day | ORAL | Status: DC
  Administered 2011-07-02 – 2011-07-03 (×2): 15 mg via ORAL
  Filled 2011-07-02 (×2): qty 1

## 2011-07-02 MED FILL — Dextrose Inj 5%: INTRAVENOUS | Qty: 50 | Status: AC

## 2011-07-02 NOTE — Progress Notes (Signed)
Subjective:  No chest pain. His lateral Rt thigh is a little painful to palpation but no sign of a hematoma. NO bruit or bruise  Objective:  Vital Signs in the last 24 hours: Temp:  [97.8 F (36.6 C)-99.1 F (37.3 C)] 97.8 F (36.6 C) (04/16 0418) Pulse Rate:  [64-81] 74  (04/16 0418) Resp:  [14-24] 23  (04/15 1935) BP: (92-106)/(51-64) 101/64 mmHg (04/16 0418) SpO2:  [93 %-99 %] 96 % (04/16 0418) Arterial Line BP: (116-128)/(60-70) 121/60 mmHg (04/15 1500)  Intake/Output from previous day:  Intake/Output Summary (Last 24 hours) at 07/02/11 0759 Last data filed at 07/02/11 0600  Gross per 24 hour  Intake   1846 ml  Output   1300 ml  Net    546 ml    Physical Exam: General appearance: alert, cooperative, no distress and morbidly obese Lungs: clear to auscultation bilaterally Heart: regular rate and rhythm, nl S1 S2, no M/R/G Abd: Obese, soft/NT/ND?NABS Rt groin and thigh without hematoma   Rate: 70  Rhythm: normal sinus rhythm and RN reported transient  bradycardia but there are no strips of this.  Lab Results:  Basename 07/02/11 0554 06/30/11 2349  WBC 9.5 10.1  HGB 13.4 14.0  PLT 167 171    Basename 07/02/11 0554 06/30/11 2349  NA 135 134*  K 3.6 3.6  CL 101 102  CO2 22 24  GLUCOSE 89 116*  BUN 12 13  CREATININE 0.95 0.92    Basename 06/30/11 1843 06/30/11 0829  TROPONINI 22.30* 6.39*   Hepatic Function Panel  Basename 06/30/11 0202  PROT 7.2  ALBUMIN 3.4*  AST 39*  ALT 19  ALKPHOS 80  BILITOT 0.4  BILIDIR --  IBILI --    Basename 06/30/11 0829  CHOL 133    Basename 06/30/11 0202  INR 1.06    Imaging: Imaging results have been reviewed  Cardiac Studies:  Assessment/Plan:   Principal Problem:  *NSTEMI, 06/29/11  Active Problems:  S/P angioplasty with DES to CFX 07/01/11  CAD, RCA PCI '09, 10/11, CABG X 4 5/12  Ischemic cardiomyopathy, EF 35-40 2D May 2012  CHF, acute, mild  Sleep apnea, "cant afford C-pap"  Hypertension, B/P  has been low this admission  Hyperlipidemia  Obstructive sleep apnea, C-pap intol  Morbid obesity  Sternal manubrial dissociation with nonunion  Plan-Ambulate. No need to CT his Rt thigh unless his symptoms worsen. Will not hold Plavix. Will contact Dr Landry Dyke nurse at the office about C-pap. Resume low dose Nitrates for distal CAD, (pt thought this caused slow HR but I suspect it was secondary to OSA). ? 2D to check LVF after MI. Doubt he can tolerate ACE secondary to B/P.  Corine Shelter PA-C 07/02/2011, 7:59 AM  ATTENDING ATTESTATION:  I have seen and examined the patient along with Corine Shelter, PA.  I have reviewed the chart, notes and new data.  I agree with Luke's note.  Brief Description: 61 y/o M with obesity (OSA not on CPAP 2/2 unable to tolerate) & ICM (EF ~35-40%) -- CAD s/p CABG x 3 p/w stuttering CP since last Fri 4/12 - but awoke with crushing angina 1AM 4/14 (Sun) - NSTEMI. Cath noted new LCx lesion -Rx'd with Resolute DES 4.54mmx 18mm; also noted distal RCA (retrograde from SVG anastamosis) ~80% lesion.     Key new complaints: No further angina with ~10 laps walked yesterday. No SOB. Just lateral R thigh tenderness   Key examination changes: agree with exam above, essentially normal cardio-pulmonary exam.  Key new findings / data: Stable Cr & H/H. ECG this AM - persistent Lateral ~ST Depression & TWI, NSR PLAN:  Transfer to Tele today  Add back Nitrate with RCA lesion - for now plan is Med Rx with ? OP NST vs Sx mediated potential stage RCA PCI (through SVG) as OP.  On Statin, ASA/Plavix - Low dose BB with borderline BPs -- no room for ACE-I/ARB now  CBGs have been relatively stable - no SSI coverage needed, will d/c SSI  Check Echo to re-assess function post-MI as baseline EF was ~~35-40% & sizeable infarct by biomarkers.  Marykay Lex, M.D., M.S. [facility] [address] [place], [place]   [address]  [phone]  07/02/2011 8:25 AM

## 2011-07-02 NOTE — Progress Notes (Signed)
CARDIAC REHAB PHASE I   PRE:  Rate/Rhythm: Sinus 73  BP:  Supine: 108/64      SaO2: 93 Room air  MODE:  Ambulation: 630 ft   POST:  Rate/Rhythem: Sinus 77  BP:  Supine: 117/70       SaO2: 96% on Room air  Quantavious Eggert, Arta Bruce  Order received and appreciated.  Patient tolerated walk without complaints or chest pain. Reviewed exercise diet and MI information with patient and wife. Mr Carchi is interested in outpatient cardiac rehab.

## 2011-07-02 NOTE — Progress Notes (Signed)
  Echocardiogram 2D Echocardiogram has been performed.  Lawana Hartzell, Real Cons 07/02/2011, 11:02 AM

## 2011-07-02 NOTE — Progress Notes (Signed)
Pt. A & O x 4 , VS WNL  Report called & given to Lakishia/ Rn ; Will cont. Monitor pt.

## 2011-07-03 LAB — CBC
HCT: 43.3 % (ref 39.0–52.0)
Hemoglobin: 14.4 g/dL (ref 13.0–17.0)
MCH: 28.3 pg (ref 26.0–34.0)
MCHC: 33.3 g/dL (ref 30.0–36.0)
MCV: 85.1 fL (ref 78.0–100.0)
Platelets: 186 10*3/uL (ref 150–400)
RBC: 5.09 MIL/uL (ref 4.22–5.81)
RDW: 13.4 % (ref 11.5–15.5)
WBC: 11.1 10*3/uL — ABNORMAL HIGH (ref 4.0–10.5)

## 2011-07-03 MED ORDER — CARVEDILOL 3.125 MG PO TABS: 3.1250 mg | ORAL_TABLET | Freq: Two times a day (BID) | ORAL | Status: DC

## 2011-07-03 MED ORDER — FUROSEMIDE 20 MG PO TABS
20.0000 mg | ORAL_TABLET | Freq: Every day | ORAL | Status: DC
  Administered 2011-07-03: 20 mg via ORAL
  Filled 2011-07-03: qty 1

## 2011-07-03 MED ORDER — CLOPIDOGREL BISULFATE 75 MG PO TABS: 75.0000 mg | ORAL_TABLET | Freq: Every day | ORAL | Status: AC

## 2011-07-03 MED ORDER — ISOSORBIDE MONONITRATE 15 MG HALF TABLET: 15.0000 mg | ORAL_TABLET | Freq: Every day | ORAL | Status: DC

## 2011-07-03 MED ORDER — ASPIRIN 325 MG PO TBEC: 325.0000 mg | DELAYED_RELEASE_TABLET | Freq: Every day | ORAL | Status: AC

## 2011-07-03 MED ORDER — FUROSEMIDE 20 MG PO TABS: 20.0000 mg | ORAL_TABLET | Freq: Every day | ORAL | Status: DC

## 2011-07-03 NOTE — Progress Notes (Signed)
[id number] Cardiac Rehab Pt states that he has been walking in hall , denies any cp or SOB. Did CHF education with pt. and wife. Pt given CHF education packet.

## 2011-07-03 NOTE — Discharge Summary (Signed)
Gregory Wander C. Verner Mccrone, MD, FACC Attending Cardiologist [facility]  

## 2011-07-03 NOTE — Progress Notes (Signed)
DC IV, DC Tele, DC Home. Discharge instructions and home medications discussed with patient and patient's family. Patient and family denies any questions or concerns at this time. Patient leaving unit via wheelchair and appears in no acute distress.  

## 2011-07-03 NOTE — Discharge Summary (Signed)
Physician Discharge Summary  Patient ID: Gregory Wiley MRN: [id number] DOB/AGE: Jul 04, 1950 61 y.o.  Admit date: 06/30/2011 Discharge date: 07/03/2011  Admission Diagnoses:  NSTEMI  Discharge Diagnoses:  Principal Problem:  *NSTEMI, 06/29/11 Active Problems:  CAD, RCA PCI '09, 10/11, CABG X 4 5/12  Hypertension, B/P has been low this admission  Hyperlipidemia  Obstructive sleep apnea, C-pap intol  Morbid obesity  Sternal manubrial dissociation with nonunion  Ischemic cardiomyopathy, EF 35-40 2D May 2012  CHF, acute, mild  S/P angioplasty with DES to CFX 07/01/11  Sleep apnea, "cant afford C-pap"   Discharged Condition: stable  Hospital Course:  This is a 61 y.o. male with a past medical history significant for CAD. He had an RCA PCI 11/09, 10/11, and ultimately CABG X 4 with L-LAD, S-AM,S-PDA/RCA. He had an EF 25% at the time of surgery but this improved to 35-40% by 2D 5/12. LOV with Dr Clarene Duke was Oct 2012. He developed SSCP Thursday while at work. Sx's recurred Friday and again at 1am the day of admission and he came to the ER. He took a NTG and his symptoms improved by the time he got here. His EKG showed new TWI lead 1, AVL, and V2. His troponin was 1.07. His BNP is also elevated at 1200. His symptoms were SSCP with radiation to his Rt arm.  He also became diaphoretic with his pain.  The patient was admitted and started on Plavix load aspirin heparin drip and nitroglycerin. The nitroglycerin eventually had to be discontinued due to hypotension.  Patient was sent for a heart catheterization on Monday, April 15( see below for results).  Post MI a 2-D echocardiogram showed ejection fraction of 35-40% grade 2 diastolic dysfunction(see below for full details). As a result patient was started on a low dose of Lasix.  He has been instructed to watch his weight every morning and call her office if he has any weight gain.  Patient has not been started on ACE inhibitor due to hypotension will  consider starting this as an outpatient he has improvement in blood pressure. Patient may also need a stress test approximately one month to evaluate distal RCA/PDA lesion. Consider P2Y12 as outpatient.  He does not qualify for life vest.  The patient was discharged home in stable condition after being seen by Dr. Rennis Golden.   Consults: None  Significant Diagnostic Studies:  Left heart cath Contrast used: 100 mL Omnipaque  Angiographic Findings:  1. The left main coronary artery is free of significant atherosclerosis and bifurcates in the usual fashion into the left anterior descending artery and left circumflex coronary artery.  2. The left anterior descending artery is totally occluded at the ostium.  3. The left circumflex coronary artery is a large-size vessel non- dominant vessel that generates 1 major oblique marginal artery. There is evidence of a severe, eccentric and ulcerated plaque in the proximal to mid vessel. The diameter stenosis is at least 95%. However there is currently TIMI-3 flow.  4. The right coronary artery is totally occluded almost immediately following the ostium.  5. The left ventricular end-diastolic pressure is 22 mm Hg. There is no evidence of aortic stenosis by pullback to the aorta.  6. The left internal mammary artery bypass to the mid LAD is widely patent. Upstream anastomosis site to the LAD is occluded. Downstream of the anastomosis there are 2 smooth sequential 70% stenoses.  7. The saphenous vein graft to the acute marginal branch of the right coronary artery is  totally occluded at the ostium.  8. The sequential saphenous vein graft to the distal right coronary artery and posterior descending artery is widely patent in both limbs. The posterior descending artery is free of meaningful stenoses although it is diffusely diseased. The distal right coronary artery just beyond the anastomosis but before the posterior lateral ventricular branch has an irregular 80% stenosis    IMPRESSIONS:  Significant progression of coronary disease since last cardiac catheterization as noted.  The culprit vessel for his non-ST segment elevation myocardial infarction is the native left circumflex coronary artery. This is supported by both the location of the EKG changes in the angiographic appearance.  RECOMMENDATION:  Percutaneous revascularization of the native left circumflex coronary artery.  Consider staged revascularization of the distal right coronary artery/posterior lateral ventricular branch if there is evidence of ischemia by noninvasive tests.  ----------------------------------------------------------------------------------------------------------------------------  Full note dictated; See diagram  DICTATION # [id number] , [id number]  Successful PCI of 99% proximal LCX stenosis to 0 with 4.0 x 18 mm Resolute DES stent post dilated to 4.5 mm.  [name], MD, [facility]  07/01/2011  12:37 PM    April 16, 2-D echocardiogram Study Conclusions  - Procedure narrative: Transthoracic echocardiography. Image quality was adequate. The study was technically difficult, as a result of body habitus. Very poor quality imagesm, difficult to determine endocardial border. - Left ventricle: The cavity size was at the upper limits of normal. Wall thickness was normal. Systolic function was moderately reduced. The estimated ejection fraction was in the range of 35% to 40%. Diffuse hypokinesis. Features are consistent with a pseudonormal left ventricular filling pattern, with concomitant abnormal relaxation and increased filling pressure (grade 2 diastolic dysfunction). Doppler parameters are consistent with elevated mean left atrial filling pressure. - Regional wall motion abnormality: Severe hypokinesis of the mid anteroseptal, basal inferior, mid inferolateral, apical septal, and apical myocardium. - Left atrium: The atrium was moderately dilated.  06/30/2011, PORTABLE CHEST -  1 VIEW  Comparison: 08/14/2010  Findings: Low lung volumes again noted, however both lungs are  clear. No evidence of pleural effusion. Heart size is stable.  Prior CABG again noted.  IMPRESSION:  Low lung volumes. No acute findings.  Treatments: Successful PCI of 99% proximal LCX stenosis to 0 with 4.0 x 18 mm Resolute DES stent post dilated to 4.5 mm.    Discharge Exam: Blood pressure 122/79, pulse 83, temperature 98.6 F (37 C), temperature source Oral, resp. rate 18, height 6' (1.829 m), weight 148.689 kg (327 lb 12.8 oz), SpO2 99.00%.   Disposition: 01-Home or Self Care  Discharge Orders    Future Orders Please Complete By Expires   Diet - low sodium heart healthy      Increase activity slowly      Discharge instructions      Comments:   1. No lifting or driving for two days. 2. Weigh yourself every morning.  If you have a 1-2 pound weight gain in a were 3-4 pounds in a week call our office for instructions.     Medication List  As of 07/03/2011 11:40 AM   STOP taking these medications         aspirin 81 MG chewable tablet      losartan 50 MG tablet         TAKE these medications         aspirin 325 MG EC tablet   Take 1 tablet (325 mg total) by mouth daily.      carvedilol  3.125 MG tablet   Commonly known as: COREG   Take 1 tablet (3.125 mg total) by mouth 2 (two) times daily with a meal.      clopidogrel 75 MG tablet   Commonly known as: PLAVIX   Take 1 tablet (75 mg total) by mouth daily with breakfast.      furosemide 20 MG tablet   Commonly known as: LASIX   Take 1 tablet (20 mg total) by mouth daily.      isosorbide mononitrate 15 mg Tb24   Commonly known as: IMDUR   Take 0.5 tablets (15 mg total) by mouth daily.      nitroGLYCERIN 0.4 MG SL tablet   Commonly known as: NITROSTAT   Place 0.4 mg under the tongue every 5 (five) minutes as needed. For chest pain      simvastatin 40 MG tablet   Commonly known as: ZOCOR   Take 40 mg by mouth every  evening.           Follow-up Information    Follow up with Chrystie Nose, MD. (Our office will call with your appt. date and time.)    Contact information:   [address] [place] [address] [phone]          Signed: Dwana Melena 07/03/2011, 11:40 AM

## 2011-07-03 NOTE — Progress Notes (Signed)
[facility]  Subjective: No complaints.  Ambulating without CP, SOB.  Objective: Vital signs in last 24 hours: Temp:  [97.4 F (36.3 C)-98.6 F (37 C)] 98.6 F (37 C) (04/17 0538) Pulse Rate:  [65-80] 80  (04/17 0538) Resp:  [18-20] 18  (04/17 0538) BP: (106-136)/(60-82) 136/82 mmHg (04/17 0538) SpO2:  [94 %-99 %] 99 % (04/17 0538) Weight:  [148.689 kg (327 lb 12.8 oz)-150.05 kg (330 lb 12.8 oz)] 148.689 kg (327 lb 12.8 oz) (04/17 0538) Last BM Date: 07/02/11  Intake/Output from previous day: 04/16 0701 - 04/17 0700 In: -  Out: 1925 [Urine:1925] Intake/Output this shift: Total I/O In: 360 [P.O.:360] Out: -   Medications Current Facility-Administered Medications  Medication Dose Route Frequency Provider Last Rate Last Dose  . 0.45 % sodium chloride infusion   Intravenous Continuous Kerry Hough, PA 50 mL/hr at 07/01/11 0800    . 0.9 %  sodium chloride infusion   Intravenous Continuous Chrystie Nose, MD      . 0.9 %  sodium chloride infusion   Intravenous Continuous [name], MD 100 mL/hr at 07/01/11 1935    . acetaminophen (TYLENOL) tablet 650 mg  650 mg Oral Q6H PRN Debby Crosley, MD   650 mg at 06/30/11 1524   Or  . acetaminophen (TYLENOL) suppository 650 mg  650 mg Rectal Q6H PRN Debby Crosley, MD      . acetaminophen (TYLENOL) tablet 650 mg  650 mg Oral Q4H PRN [name], MD      . alum & mag hydroxide-simeth (MAALOX/MYLANTA) 200-200-20 MG/5ML suspension 30 mL  30 mL Oral Q6H PRN Debby Crosley, MD      . aspirin EC tablet 325 mg  325 mg Oral Daily [name], MD      . aspirin tablet 325 mg  325 mg Oral Daily Debby Crosley, MD   325 mg at 07/02/11 1133  . bivalirudin (ANGIOMAX) 5 mg/mL in sodium chloride 0.9 % 50 mL infusion  0.25 mg/kg/hr Intravenous To Cath Chrystie Nose, MD      . carvedilol (COREG) tablet 3.125 mg  3.125 mg Oral BID WC Debby Crosley, MD   3.125 mg at 07/02/11 1820  . clopidogrel (PLAVIX) tablet  75 mg  75 mg Oral Q breakfast Chrystie Nose, MD   75 mg at 07/02/11 0805  . HYDROcodone-acetaminophen (NORCO) 5-325 MG per tablet 1-2 tablet  1-2 tablet Oral Q4H PRN Debby Crosley, MD      . isosorbide mononitrate (IMDUR) 24 hr tablet 15 mg  15 mg Oral Daily Eda Paschal [place], [place]   15 mg at 07/02/11 1133  . morphine 2 MG/ML injection 1 mg  1 mg Intravenous Q4H PRN Debby Crosley, MD      . nitroGLYCERIN (NITROSTAT) SL tablet 0.4 mg  0.4 mg Sublingual Q5 min PRN Debby Crosley, MD      . ondansetron (ZOFRAN) tablet 4 mg  4 mg Oral Q6H PRN Debby Crosley, MD       Or  . ondansetron (ZOFRAN) injection 4 mg  4 mg Intravenous Q6H PRN Debby Crosley, MD      . pantoprazole (PROTONIX) EC tablet 40 mg  40 mg Oral Q0600 Abelino Derrick, PA   40 mg at 07/03/11 0607  . simvastatin (ZOCOR) tablet 40 mg  40 mg Oral QPM Debby Crosley, MD   40 mg at 07/02/11 1819  . zolpidem (AMBIEN) tablet 5 mg  5 mg  Oral QHS PRN Gery Pray, MD        PE: General appearance: alert, cooperative and no distress Lungs: clear to auscultation bilaterally Heart: RRR, no MM,R,G Extremities: No LEE Pulses: radials 2+. Right Groin: No hematoma, bruit, ecchymosis.  Nontender  Lab Results:   Basename 07/03/11 0620 07/02/11 0554 06/30/11 2349  WBC 11.1* 9.5 10.1  HGB 14.4 13.4 14.0  HCT 43.3 40.9 42.1  PLT 186 167 171   BMET  Basename 07/02/11 0554 06/30/11 2349  NA 135 134*  K 3.6 3.6  CL 101 102  CO2 22 24  GLUCOSE 89 116*  BUN 12 13  CREATININE 0.95 0.92  CALCIUM 8.5 8.5    Assessment/Plan  Principal Problem:  *NSTEMI, 06/29/11 Active Problems:  CAD, RCA PCI '09, 10/11, CABG X 4 5/12  Hypertension, B/P has been low this admission  Hyperlipidemia  Obstructive sleep apnea, C-pap intol  Morbid obesity  Sternal manubrial dissociation with nonunion  Ischemic cardiomyopathy, EF 35-40 2D May 2012  CHF, acute, mild  S/P angioplasty with DES to CFX 07/01/11  Sleep apnea, "cant afford C-pap"  Plan:  S/P Left heart  cath 07/01/2011.  Successful PCI of 99% proximal LCX stenosis to 0 with 4.0 x 18 mm Resolute DES stent post dilated to 4.5 mm.  BP: 103/61 - 136/82. HR 65-80.  ASA/Coreg/Plavix/Imdur/statin.  See if ACE can be added as OP. WBCs slightly elevated.   Post MI echo EF 35-40% Graded two diastolic dys. .  Distal RCA has 80% retrograde from SVG anastomosis. Medical treatment.  ? OP NST.  Add Lasix 20mg  daily.      LOS: 3 days    Evalin Shawhan W 07/03/2011 9:03 AM

## 2011-07-03 NOTE — Progress Notes (Signed)
Pt. Seen and examined. Agree with the NP/PA-C note as written.  Doing well, denies chest pain complaints. No groin hematoma. Will be okay for discharge today. Home lasix due to EF 35-40%. On appropriate HF meds, except for ACE-I due to hypotension.  Will need to consider starting ACE-I as an outpatient if bp improves.  May need stress testing in 1 month or so to evaluate the distal RCA/PDA lesion. He does not qualify for LifeVest.  Chrystie Nose, MD, [facility] Attending Cardiologist [facility]

## 2011-09-12 ENCOUNTER — Encounter: Payer: Self-pay | Admitting: Gastroenterology

## 2011-10-22 ENCOUNTER — Ambulatory Visit: Payer: 59 | Admitting: Gastroenterology

## 2011-12-04 ENCOUNTER — Encounter: Payer: Self-pay | Admitting: Gastroenterology

## 2012-05-21 ENCOUNTER — Encounter: Payer: Self-pay | Admitting: Cardiology

## 2012-06-12 ENCOUNTER — Encounter: Payer: Self-pay | Admitting: Cardiovascular Disease

## 2012-10-06 ENCOUNTER — Other Ambulatory Visit: Payer: Self-pay | Admitting: Physician Assistant

## 2012-10-07 ENCOUNTER — Other Ambulatory Visit: Payer: Self-pay | Admitting: *Deleted

## 2012-10-07 MED ORDER — ISOSORBIDE MONONITRATE ER 30 MG PO TB24: 15.0000 mg | ORAL_TABLET | Freq: Every day | ORAL | Status: DC

## 2012-10-07 MED ORDER — CLOPIDOGREL BISULFATE 75 MG PO TABS: 75.0000 mg | ORAL_TABLET | Freq: Every day | ORAL | Status: DC

## 2012-10-07 MED ORDER — RANOLAZINE ER 500 MG PO TB12: 500.0000 mg | ORAL_TABLET | Freq: Two times a day (BID) | ORAL | Status: DC

## 2012-12-22 ENCOUNTER — Telehealth: Payer: Self-pay | Admitting: Gastroenterology

## 2012-12-22 NOTE — Telephone Encounter (Signed)
Left message for patient to call back  

## 2012-12-23 NOTE — Telephone Encounter (Signed)
Patient is on Plavix.  He had a MI about 1 1/2 years ago.  He will come for an office visit on 01/25/13

## 2013-01-04 ENCOUNTER — Encounter: Payer: Self-pay | Admitting: Cardiovascular Disease

## 2013-01-04 ENCOUNTER — Ambulatory Visit (INDEPENDENT_AMBULATORY_CARE_PROVIDER_SITE_OTHER): Payer: 59 | Admitting: Cardiovascular Disease

## 2013-01-04 VITALS — BP 114/80 | HR 60 | Ht 71.0 in | Wt 340.4 lb

## 2013-01-04 DIAGNOSIS — R5381 Other malaise: Secondary | ICD-10-CM

## 2013-01-04 DIAGNOSIS — G4733 Obstructive sleep apnea (adult) (pediatric): Secondary | ICD-10-CM

## 2013-01-04 DIAGNOSIS — E785 Hyperlipidemia, unspecified: Secondary | ICD-10-CM

## 2013-01-04 DIAGNOSIS — I214 Non-ST elevation (NSTEMI) myocardial infarction: Secondary | ICD-10-CM

## 2013-01-04 DIAGNOSIS — Z79899 Other long term (current) drug therapy: Secondary | ICD-10-CM

## 2013-01-04 DIAGNOSIS — I2510 Atherosclerotic heart disease of native coronary artery without angina pectoris: Secondary | ICD-10-CM

## 2013-01-04 LAB — COMPREHENSIVE METABOLIC PANEL
ALT: 15 U/L (ref 0–53)
AST: 15 U/L (ref 0–37)
Albumin: 4 g/dL (ref 3.5–5.2)
Alkaline Phosphatase: 65 U/L (ref 39–117)
BUN: 15 mg/dL (ref 6–23)
CO2: 27 mEq/L (ref 19–32)
Calcium: 9.2 mg/dL (ref 8.4–10.5)
Chloride: 104 mEq/L (ref 96–112)
Creat: 1.12 mg/dL (ref 0.50–1.35)
Glucose, Bld: 105 mg/dL — ABNORMAL HIGH (ref 70–99)
Potassium: 5 mEq/L (ref 3.5–5.3)
Sodium: 137 mEq/L (ref 135–145)
Total Bilirubin: 0.6 mg/dL (ref 0.3–1.2)
Total Protein: 7.5 g/dL (ref 6.0–8.3)

## 2013-01-04 LAB — CBC
HCT: 44.4 % (ref 39.0–52.0)
Hemoglobin: 15.3 g/dL (ref 13.0–17.0)
MCH: 29.8 pg (ref 26.0–34.0)
MCHC: 34.5 g/dL (ref 30.0–36.0)
MCV: 86.4 fL (ref 78.0–100.0)
Platelets: 229 10*3/uL (ref 150–400)
RBC: 5.14 MIL/uL (ref 4.22–5.81)
RDW: 13.8 % (ref 11.5–15.5)
WBC: 8.1 10*3/uL (ref 4.0–10.5)

## 2013-01-04 LAB — TSH: TSH: 1.115 u[IU]/mL (ref 0.350–4.500)

## 2013-01-04 NOTE — Patient Instructions (Addendum)
Your physician recommends that you schedule a follow-up appointment in: 6 MONTHS  Your physician has recommended you make the following change in your medication: Decrease the aspirin down to 81 mg   Your physician has requested that you have en exercise stress myoview. For further information please visit [email or web address] Please follow instruction sheet, as given. this will be done in April 2015.  Your physician recommends that you return for lab work today.Marland Kitchen

## 2013-01-05 LAB — NMR LIPOPROFILE WITH LIPIDS
Cholesterol, Total: 199 mg/dL (ref <200)
HDL Particle Number: 28 umol/L — ABNORMAL LOW (ref >=30.5)
HDL Size: 8.7 nm — ABNORMAL LOW (ref >=9.2)
HDL-C: 47 mg/dL (ref >=40)
LDL (calc): 129 mg/dL — ABNORMAL HIGH (ref <100)
LDL Particle Number: 1913 nmol/L — ABNORMAL HIGH (ref <1000)
LDL Size: 21.1 nm (ref >20.5)
LP-IR Score: 73 — ABNORMAL HIGH (ref <=45)
Large HDL-P: 3 umol/L — ABNORMAL LOW (ref >=4.8)
Large VLDL-P: 5 nmol/L — ABNORMAL HIGH (ref <=2.7)
Small LDL Particle Number: 881 nmol/L — ABNORMAL HIGH (ref <=527)
Triglycerides: 113 mg/dL (ref <150)
VLDL Size: 53.5 nm — ABNORMAL HIGH (ref <=46.6)

## 2013-01-06 ENCOUNTER — Encounter: Payer: Self-pay | Admitting: Cardiovascular Disease

## 2013-01-06 DIAGNOSIS — G4733 Obstructive sleep apnea (adult) (pediatric): Secondary | ICD-10-CM | POA: Insufficient documentation

## 2013-01-06 NOTE — Progress Notes (Signed)
Patient ID: Gregory Wiley, male   DOB: 25-Oct-1950, 62 y.o.   MRN: [id number]     HPI: Gregory Wiley is a 62 y.o. male presents to the office today for a 1 year cardiology evaluation. He is a former patient of Dr. Clarene Duke.  [name] is a 62 year old gentleman who has established coronary artery disease. He underwent initial stenting of his right coronary artery in May 2011. Socially, he underwent CABG surgery with LIMA to the LAD, sequential vein to the distal RCA and PDA, and vein to the acute marginal branch of his right coronary artery. In April 2013 he developed unstable angina and ruled in for non-ST segment elevation myocardial infarction. Catheterization revealed 99% stenosis of the large left circumflex vessel and he underwent successful stenting with insertion of a 4.0x18 mm resolute DES stent post dilated to 4.5 mm.  Additional problems include obstructive sleep apnea. On his initialpolysomnogram in 2010 AHI was 11.3 overall, but REM sleep he had moderate sleep apnea with an AHI of 24.8. He never did initiate CPAP therapy at that time. However, since his myocardial infarction he has been using CPAP therapy since July 2013. Presently, he has a CPAP although unit.   He has a history of hyperlipidemia, hypertension, and morbid obesity, but he has noticed some weight loss. He works [employer]. He denies recent chest pain. He denies tachycardia palpitations. He denies significant shortness of breath. An echo Doppler study in May 2013 suggested an ejection fraction of 38%. And a nuclear imaging this was 37% with scar in the LAD territory.  Past Medical History  Diagnosis Date  . Coronary artery disease     stent, then CABG 07/20/10  . MI (myocardial infarction)     NSTEMI- last cath 06/2011-Stent to LCX-DES, last nuc 07/30/11 low risk  . Diabetes mellitus   . Hypertension   . Hyperlipidemia   . Obesity (BMI 30-39.9)   . OSA on CPAP     since July 2013  . H/O cardiomyopathy       ischemic, now last echo 07/30/11, EF 38%W    Past Surgical History  Procedure Laterality Date  . Coronary artery bypass graft  07/20/2010     LIMA-LAD; VG-ACUTE MARG of RCA; Seq VG-distal RCA & then pda  . Coronary angioplasty with stent placement  07/01/2011    DES-Resolute to native LCX  . Coronary angioplasty with stent placement  12/01/2007    COMPLEX 5 LESION PCI INCLUDING CUTTING BALLOON AND 4 CYPHER DESs  . Hernia repair  2006  . Knee surgery      Allergies  Allergen Reactions  . Diphenhydramine Hcl Anaphylaxis    Current Outpatient Prescriptions  Medication Sig Dispense Refill  . aspirin 325 MG tablet Take 325 mg by mouth daily.      . carvedilol (COREG) 3.125 MG tablet TAKE 1 TABLET TWICE A DAY  180 tablet  1  . clopidogrel (PLAVIX) 75 MG tablet Take 1 tablet (75 mg total) by mouth daily.  90 tablet  1  . isosorbide mononitrate (IMDUR) 30 MG 24 hr tablet Take 0.5 tablets (15 mg total) by mouth daily.  45 tablet  1  . nitroGLYCERIN (NITROSTAT) 0.4 MG SL tablet Place 0.4 mg under the tongue every 5 (five) minutes as needed. For chest pain      . ranolazine (RANEXA) 500 MG 12 hr tablet Take 1 tablet (500 mg total) by mouth 2 (two) times daily.  180 tablet  1  .  simvastatin (ZOCOR) 40 MG tablet Take 40 mg by mouth every evening.      . furosemide (LASIX) 20 MG tablet Take 20 mg by mouth daily. Takes only PRN.       No current facility-administered medications for this visit.    History   Social History  . Marital Status: Married    Spouse Name: N/A    Number of Children: 5  . Years of Education: N/A   Occupational History  .      works at the [employer]   Social History Main Topics  . Smoking status: Never Smoker   . Smokeless tobacco: Never Used  . Alcohol Use: No  . Drug Use: No  . Sexual Activity: Not on file   Other Topics Concern  . Not on file   Social History Narrative  . No narrative on file    Family History  Problem Relation Age of Onset   . Diabetes type II    . Coronary artery disease    . Diabetes type II Mother   . CAD Father 55    CABG, PCI, died at 59  . Heart attack Father 60  . Hypertension Brother   . Diabetes Brother   . Hyperlipidemia Brother   . Cancer - Other Maternal Grandmother     brain  . COPD Paternal Grandfather    Additional social history is notable in that he is married has 5 children 4 grandchildren. He does try to walk. There is no tobacco or alcohol use.  ROS is negative for fevers, chills or night sweats. He denies visual symptoms. He admits to utilizing his CPAP although unit. He denies PND or orthopnea. He denies tachycardia palpitations. He denies recurrent anginal symptoms. He didn't denies change in bowel or bladder habits. There is no nausea vomiting or diarrhea. He denies hematuria or hematochezia. There is no claudication. He denies restless legs. There is no bruxism. He denies hypnagogic hallucinations. He is unaware breakthrough snoring when he uses CPAP.  Other comprehensive 12 point system review is negative.  PE BP 114/80  Pulse 60  Ht 5\' 11"  (1.803 m)  Wt 340 lb 6.4 oz (154.404 kg)  BMI 47.5 kg/m2  General: Alert, oriented, no distress.  Skin: normal turgor, no rashes HEENT: Normocephalic, atraumatic. Pupils round and reactive; sclera anicteric;no lid lag.  Nose without nasal septal hypertrophy Mouth/Parynx benign; Mallinpatti scale 3/4 Neck: Thick neck; No JVD, no carotid briuts Lungs: clear to ausculatation and percussion; no wheezing or rales Heart: RRR, s1 s2 normal 1/6 sem Abdomen: soft, nontender; no hepatosplenomehaly, BS+; abdominal aorta nontender and not dilated by palpation. Pulses 2+ Extremities: no clubbing cyanosis or edema, Homan's sign negative  Neurologic: grossly nonfocal Psychologic: normal affect and mood.  ECG: Normal sinus rhythm at 60. QS complex in V1 through V3 concordant with his documented LAD region scar. T-wave inversion in leads 1 and avL  unchanged.    LABS:  BMET    Component Value Date/Time   NA 137 01/04/2013 1206   K 5.0 01/04/2013 1206   CL 104 01/04/2013 1206   CO2 27 01/04/2013 1206   GLUCOSE 105* 01/04/2013 1206   BUN 15 01/04/2013 1206   CREATININE 1.12 01/04/2013 1206   CREATININE 0.95 07/02/2011 0554   CALCIUM 9.2 01/04/2013 1206   GFRNONAA 88* 07/02/2011 0554   GFRAA >90 07/02/2011 0554     Hepatic Function Panel     Component Value Date/Time   PROT 7.5 01/04/2013 1206  ALBUMIN 4.0 01/04/2013 1206   AST 15 01/04/2013 1206   ALT 15 01/04/2013 1206   ALKPHOS 65 01/04/2013 1206   BILITOT 0.6 01/04/2013 1206     CBC    Component Value Date/Time   WBC 8.1 01/04/2013 1206   RBC 5.14 01/04/2013 1206   HGB 15.3 01/04/2013 1206   HCT 44.4 01/04/2013 1206   PLT 229 01/04/2013 1206   MCV 86.4 01/04/2013 1206   MCH 29.8 01/04/2013 1206   MCHC 34.5 01/04/2013 1206   RDW 13.8 01/04/2013 1206   LYMPHSABS 3.5 06/30/2011 0202   MONOABS 0.9 06/30/2011 0202   EOSABS 0.5 06/30/2011 0202   BASOSABS 0.0 06/30/2011 0202     BNP    Component Value Date/Time   PROBNP 1224.0* 06/30/2011 0202    Lipid Panel     Component Value Date/Time   CHOL 133 06/30/2011 0829   TRIG 113 01/04/2013 1206   TRIG 74 06/30/2011 0829   HDL 43 06/30/2011 0829   CHOLHDL 3.1 06/30/2011 0829   VLDL 15 06/30/2011 0829   LDLCALC 129* 01/04/2013 1206   LDLCALC 75 06/30/2011 0829     RADIOLOGY: No results found.    ASSESSMENT AND PLAN: The impression is that Gregory Wiley is a 62 year old gentleman with a history of morbid obesity, mild hypertension, known coronary artery disease who is status post initial stenting of his right coronary artery in May 2011 with subsequent CABG surgery. In April 2013 he was found to have subtotal stenosis in a very large circumflex vessel and underwent successful insertion of a large Resolute DES stent. His last nuclear perfusion study was in May 2013 which revealed an ejection fraction  approximately 37%. I am recommending that in 6 months, he undergo a two-year followup exercise Myoview study the he is fasting today I will check a complete set of laboratory including a CBC, conference of metabolic panel, TSH, MR lipoprotein a on his current therapy and adjustments will be made. We will try to obtain a download of his CPAP unit his pressures and adequacy of treatment. I will see him in 6 months for cardiology followup evaluation. I have suggested he reduce his aspirin to 81 mg since he is on platelet therapy with Plavix at 75 mg. I have discussed the importance of continued weight loss and exercise prescription. I will see him in 6 months following his exercise Myoview study and further recommendations will be made at that time.     [name], MD, [facility]  01/06/2013 6:49 PM

## 2013-01-21 ENCOUNTER — Other Ambulatory Visit: Payer: Self-pay

## 2013-01-25 ENCOUNTER — Other Ambulatory Visit: Payer: Self-pay | Admitting: Gastroenterology

## 2013-01-25 ENCOUNTER — Ambulatory Visit (INDEPENDENT_AMBULATORY_CARE_PROVIDER_SITE_OTHER): Payer: 59 | Admitting: Gastroenterology

## 2013-01-25 ENCOUNTER — Telehealth: Payer: Self-pay

## 2013-01-25 ENCOUNTER — Encounter: Payer: Self-pay | Admitting: Gastroenterology

## 2013-01-25 VITALS — BP 126/80 | HR 60 | Ht 70.0 in | Wt 344.0 lb

## 2013-01-25 DIAGNOSIS — Z1211 Encounter for screening for malignant neoplasm of colon: Secondary | ICD-10-CM

## 2013-01-25 DIAGNOSIS — I2510 Atherosclerotic heart disease of native coronary artery without angina pectoris: Secondary | ICD-10-CM

## 2013-01-25 DIAGNOSIS — K921 Melena: Secondary | ICD-10-CM

## 2013-01-25 MED ORDER — PEG-KCL-NACL-NASULF-NA ASC-C 100 G PO SOLR: 1.0000 | Freq: Once | ORAL | Status: DC

## 2013-01-25 NOTE — Progress Notes (Signed)
    History of Present Illness: This is a 62 year old male accompanied by his wife. Is overdue for colorectal cancer screening. He previously underwent colonoscopy in March 2002 showing diverticulosis and small internal hemorrhoids. He notes occasional scant amount of red blood on the tissue paper the symptoms have been present for several years and have not changed. He is maintained on Plavix with a history of a coronary artery disease, CABG 07/2010 and MI in 06/2011. Dr. Tresa Endo as his cardiologist and is felt to be stable at this time.  Review of Systems: Pertinent positive and negative review of systems were noted in the above HPI section. All other review of systems were otherwise negative.  Current Medications, Allergies, Past Medical History, Past Surgical History, Family History and Social History were reviewed in [employer] record.  Physical Exam: General: Well developed , well nourished, obese, no acute distress Head: Normocephalic and atraumatic Eyes:  sclerae anicteric, EOMI Ears: Normal auditory acuity Mouth: No deformity or lesions Neck: Supple, no masses or thyromegaly Lungs: Clear throughout to auscultation Heart: Regular rate and rhythm; no murmurs, rubs or bruits Abdomen: Soft, non tender and non distended. No masses, hepatosplenomegaly or hernias noted. Normal Bowel sounds Rectal: Deferred to colonoscopy Musculoskeletal: Symmetrical with no gross deformities  Skin: No lesions on visible extremities Pulses:  Normal pulses noted Extremities: No clubbing, cyanosis, edema or deformities noted Neurological: Alert oriented x 4, grossly nonfocal Cervical Nodes:  No significant cervical adenopathy Inguinal Nodes: No significant inguinal adenopathy Psychological:  Alert and cooperative. Normal mood and affect  Assessment and Recommendations:  1. Colorectal cancer screening, average risk. Schedule colonoscopy. The risks, benefits, and alternatives to  colonoscopy with possible biopsy and possible polypectomy were discussed with the patient and they consent to proceed.   2. Intermittent, stable small volume hematochezia. Secondary to internal hemorrhoids.  3. Coronary artery disease, cardiomyopathy, status post CABG, status post MI. Maintain on Plavix. Obtain clearance for colonoscopy and for 5 day hold Plavix from Dr. Tresa Endo.

## 2013-01-25 NOTE — Patient Instructions (Signed)
You have been scheduled for a colonoscopy with propofol. Please follow written instructions given to you at your visit today.  Please pick up your prep kit at the pharmacy within the next 1-3 days. If you use inhalers (even only as needed), please bring them with you on the day of your procedure. Your physician has requested that you go to www.startemmi.com and enter the access code given to you at your visit today. This web site gives a general overview about your procedure. However, you should still follow specific instructions given to you by our office regarding your preparation for the procedure.  Thank you for choosing me and [facility].  Venita Lick. Pleas Koch., MD., Clementeen Graham  cc: Aida Puffer, MD, Nicki Guadalajara, MD

## 2013-01-25 NOTE — Telephone Encounter (Signed)
01/25/2013   RE: Gregory Wiley DOB: 04/20/1950 MRN: [id number]   Dear Dr. Tresa Endo and Dr. Clarene Duke,    We have scheduled the above patient for an endoscopic procedure. Our records show that he is on anticoagulation therapy.   Please advise as to how long the patient may come off his therapy of Plavix prior to the procedure, which is scheduled for 02/10/13.  Please fax back/ or route the completed form to [place] at [phone].   Sincerely,    Christie Nottingham, CMA

## 2013-01-26 NOTE — Telephone Encounter (Signed)
Ok to hold plavix and asa for 5 days

## 2013-01-27 ENCOUNTER — Encounter: Payer: Self-pay | Admitting: Gastroenterology

## 2013-01-27 NOTE — Telephone Encounter (Signed)
Left a message for patient to return my call. 

## 2013-01-27 NOTE — Telephone Encounter (Signed)
Patient returned my call and I informed patient that he can hold Plavix and Aspirin 5 days before his procedure per Dr. Tresa Endo. Pt agreed and verbalized understanding.

## 2013-02-01 ENCOUNTER — Telehealth: Payer: Self-pay | Admitting: Gastroenterology

## 2013-02-01 NOTE — Telephone Encounter (Signed)
Patient states that money is tight because he is retired and his insurance only pays for generic. Told patient that this prep does not have a generic form and neither does out alternative preps. Told him that I have a 10 dollar off coupon for Moviprep if that will help. Patient states it will help and he will have his wife come by for coupon today. Left coupon up front for patient to pick up.

## 2013-02-10 ENCOUNTER — Encounter (HOSPITAL_COMMUNITY): Payer: Self-pay | Admitting: *Deleted

## 2013-02-10 ENCOUNTER — Ambulatory Visit (HOSPITAL_COMMUNITY)
Admission: RE | Admit: 2013-02-10 | Discharge: 2013-02-10 | Disposition: A | Discharge status: Home / Self Care | Payer: 59 | Source: Ambulatory Visit | Attending: Gastroenterology | Admitting: Gastroenterology

## 2013-02-10 ENCOUNTER — Encounter (HOSPITAL_COMMUNITY)
Admission: RE | Disposition: A | Discharge status: Home / Self Care | Payer: Self-pay | Source: Ambulatory Visit | Attending: Gastroenterology

## 2013-02-10 DIAGNOSIS — I428 Other cardiomyopathies: Secondary | ICD-10-CM | POA: Insufficient documentation

## 2013-02-10 DIAGNOSIS — D126 Benign neoplasm of colon, unspecified: Secondary | ICD-10-CM | POA: Insufficient documentation

## 2013-02-10 DIAGNOSIS — Z951 Presence of aortocoronary bypass graft: Secondary | ICD-10-CM | POA: Insufficient documentation

## 2013-02-10 DIAGNOSIS — K648 Other hemorrhoids: Secondary | ICD-10-CM | POA: Insufficient documentation

## 2013-02-10 DIAGNOSIS — I2510 Atherosclerotic heart disease of native coronary artery without angina pectoris: Secondary | ICD-10-CM

## 2013-02-10 DIAGNOSIS — I252 Old myocardial infarction: Secondary | ICD-10-CM | POA: Insufficient documentation

## 2013-02-10 DIAGNOSIS — Z1211 Encounter for screening for malignant neoplasm of colon: Secondary | ICD-10-CM

## 2013-02-10 DIAGNOSIS — K5730 Diverticulosis of large intestine without perforation or abscess without bleeding: Secondary | ICD-10-CM | POA: Insufficient documentation

## 2013-02-10 HISTORY — PX: COLONOSCOPY: SHX5424

## 2013-02-10 SURGERY — COLONOSCOPY
Anesthesia: Moderate Sedation

## 2013-02-10 MED ORDER — MIDAZOLAM HCL 5 MG/5ML IJ SOLN
INTRAMUSCULAR | Status: DC | PRN
  Administered 2013-02-10 (×2): 2 mg via INTRAVENOUS
  Administered 2013-02-10: 1 mg via INTRAVENOUS
  Administered 2013-02-10: 2 mg via INTRAVENOUS
  Administered 2013-02-10: 1 mg via INTRAVENOUS

## 2013-02-10 MED ORDER — FENTANYL CITRATE 0.05 MG/ML IJ SOLN
INTRAMUSCULAR | Status: DC | PRN
  Administered 2013-02-10 (×5): 25 ug via INTRAVENOUS

## 2013-02-10 MED ORDER — SODIUM CHLORIDE 0.9 % IV SOLN
INTRAVENOUS | Status: DC
  Administered 2013-02-10: 500 mL via INTRAVENOUS

## 2013-02-10 MED ORDER — MIDAZOLAM HCL 10 MG/2ML IJ SOLN
INTRAMUSCULAR | Status: AC
  Filled 2013-02-10: qty 2

## 2013-02-10 MED ORDER — FENTANYL CITRATE 0.05 MG/ML IJ SOLN
INTRAMUSCULAR | Status: AC
Start: 2013-02-10 — End: 2013-02-10
  Filled 2013-02-10: qty 4

## 2013-02-10 NOTE — Interval H&P Note (Signed)
History and Physical Interval Note:  02/10/2013 8:43 AM  Gregory Wiley  has presented today for surgery, with the diagnosis of V76.51  The various methods of treatment have been discussed with the patient and family. After consideration of risks, benefits and other options for treatment, the patient has consented to  Procedure(s): COLONOSCOPY (N/A) as a surgical intervention .  The patient's history has been reviewed, patient examined, no change in status, stable for surgery.  I have reviewed the patient's chart and labs.  Questions were answered to the patient's satisfaction.     Venita Lick. Russella Dar MD

## 2013-02-10 NOTE — H&P (View-Only) (Signed)
    History of Present Illness: This is a 62-year-old male accompanied by his wife. Is overdue for colorectal cancer screening. He previously underwent colonoscopy in March 2002 showing diverticulosis and small internal hemorrhoids. He notes occasional scant amount of red blood on the tissue paper the symptoms have been present for several years and have not changed. He is maintained on Plavix with a history of a coronary artery disease, CABG 07/2010 and MI in 06/2011. Dr. Kelly as his cardiologist and is felt to be stable at this time.  Review of Systems: Pertinent positive and negative review of systems were noted in the above HPI section. All other review of systems were otherwise negative.  Current Medications, Allergies, Past Medical History, Past Surgical History, Family History and Social History were reviewed in [facility] electronic medical record.  Physical Exam: General: Well developed , well nourished, obese, no acute distress Head: Normocephalic and atraumatic Eyes:  sclerae anicteric, EOMI Ears: Normal auditory acuity Mouth: No deformity or lesions Neck: Supple, no masses or thyromegaly Lungs: Clear throughout to auscultation Heart: Regular rate and rhythm; no murmurs, rubs or bruits Abdomen: Soft, non tender and non distended. No masses, hepatosplenomegaly or hernias noted. Normal Bowel sounds Rectal: Deferred to colonoscopy Musculoskeletal: Symmetrical with no gross deformities  Skin: No lesions on visible extremities Pulses:  Normal pulses noted Extremities: No clubbing, cyanosis, edema or deformities noted Neurological: Alert oriented x 4, grossly nonfocal Cervical Nodes:  No significant cervical adenopathy Inguinal Nodes: No significant inguinal adenopathy Psychological:  Alert and cooperative. Normal mood and affect  Assessment and Recommendations:  1. Colorectal cancer screening, average risk. Schedule colonoscopy. The risks, benefits, and alternatives to  colonoscopy with possible biopsy and possible polypectomy were discussed with the patient and they consent to proceed.   2. Intermittent, stable small volume hematochezia. Secondary to internal hemorrhoids.  3. Coronary artery disease, cardiomyopathy, status post CABG, status post MI. Maintain on Plavix. Obtain clearance for colonoscopy and for 5 day hold Plavix from Dr. Kelly. 

## 2013-02-10 NOTE — Op Note (Addendum)
[facility] [address] [place], [address]   COLONOSCOPY PROCEDURE REPORT PATIENT: Gregory Wiley, Gregory Wiley  MR#: [id number] BIRTHDATE: 08-13-1950 , 62  yrs. old GENDER: Male ENDOSCOPIST: Meryl Dare, MD, [facility] REFERRED HQ:IONGE Little, M.D. PROCEDURE DATE:  02/10/2013 PROCEDURE:   Colonoscopy with biopsy and snare polypectomy First Screening Colonoscopy - Avg.  risk and is 50 yrs.  old or older - No.  Prior Negative Screening - Now for repeat screening. 10 or more years since last screening  History of Adenoma - Now for follow-up colonoscopy & has been > or = to 3 yrs.  N/A  Polyps Removed Today? Yes. ASA CLASS:   Class III INDICATIONS:average risk screening. MEDICATIONS: medications were titrated to patient response per physician's verbal order, Fentanyl 125 mcg IV, and Versed 8 mg IV DESCRIPTION OF PROCEDURE:   After the risks benefits and alternatives of the procedure were thoroughly explained, informed consent was obtained.  A digital rectal exam revealed no abnormalities of the rectum.   The Pentax Ped Colon [id number] and Pentax Slim Colonicsope [phone])  endoscope was introduced through the anus and advanced to the cecum, which was identified by both the appendix and ileocecal valve. No adverse events experienced with a tortuous and redundant colon.   The quality of the prep was good, using MoviPrep  The instrument was then slowly withdrawn as the colon was fully examined.  COLON FINDINGS: A sessile polyp measuring 4 mm in size was found in the transverse colon.  A polypectomy was performed with cold forceps.  The resection was complete and the polyp tissue was completely retrieved.   A sessile polyp measuring 6 mm in size was found in the sigmoid colon.  A polypectomy was performed with a cold snare.  The resection was complete and the polyp tissue was completely retrieved.   Mild diverticulosis was noted in the descending colon and sigmoid colon.   The  colon was otherwise normal.  There was no diverticulosis, inflammation, polyps or cancers unless previously stated. Retroflexed views revealed small internal hemorrhoids. The time to cecum=4 minutes 30 seconds. Withdrawal time=16 minutes 00 seconds.  The scope was withdrawn and the procedure completed. COMPLICATIONS: There were no complications. ENDOSCOPIC IMPRESSION: 1.   Sessile polyp measuring 4 mm in the transverse colon; polypectomy performed with cold forceps 2.   Sessile polyp measuring 6 mm in the sigmoid colon; polypectomy performed with a cold snare 3.   Mild diverticulosis in the descending colon and sigmoid colon 4.   Small internal hemorrhoids RECOMMENDATIONS: 1.  Await pathology results 2.  Repeat colonoscopy in 5 years if polyp(s) adenomatous; otherwise 10 years 3.  High fiber diet with liberal fluid intake and resume Plavix today  eSigned:  Meryl Dare, MD, [facility] 02/10/2013 11:17 AM Revised: 02/10/2013 11:17 AM

## 2013-02-12 ENCOUNTER — Encounter: Payer: Self-pay | Admitting: Gastroenterology

## 2013-02-15 ENCOUNTER — Encounter (HOSPITAL_COMMUNITY): Payer: Self-pay | Admitting: Gastroenterology

## 2013-03-18 DIAGNOSIS — K254 Chronic or unspecified gastric ulcer with hemorrhage: Secondary | ICD-10-CM

## 2013-03-18 HISTORY — DX: Chronic or unspecified gastric ulcer with hemorrhage: K25.4

## 2013-03-18 HISTORY — PX: BASAL CELL CARCINOMA EXCISION: SHX1214

## 2013-06-18 ENCOUNTER — Encounter: Payer: Self-pay | Admitting: Cardiovascular Disease

## 2013-06-23 ENCOUNTER — Telehealth (HOSPITAL_COMMUNITY): Payer: Self-pay

## 2013-06-29 ENCOUNTER — Ambulatory Visit (HOSPITAL_COMMUNITY)
Admission: RE | Admit: 2013-06-29 | Discharge: 2013-06-29 | Disposition: A | Discharge status: Home / Self Care | Payer: 59 | Source: Ambulatory Visit | Attending: Cardiovascular Disease | Admitting: Cardiovascular Disease

## 2013-06-29 DIAGNOSIS — I2510 Atherosclerotic heart disease of native coronary artery without angina pectoris: Secondary | ICD-10-CM | POA: Insufficient documentation

## 2013-06-29 MED ORDER — TECHNETIUM TC 99M SESTAMIBI GENERIC - CARDIOLITE
12.1000 | Freq: Once | INTRAVENOUS | Status: AC | PRN
  Administered 2013-06-29: 12.1 via INTRAVENOUS

## 2013-06-29 MED ORDER — TECHNETIUM TC 99M SESTAMIBI GENERIC - CARDIOLITE
36.2000 | Freq: Once | INTRAVENOUS | Status: AC | PRN
  Administered 2013-06-29: 36.2 via INTRAVENOUS

## 2013-06-29 NOTE — Procedures (Addendum)
[place] [address] [place] [address] [phone]  Cardiology Nuclear Med Study  Gregory Wiley is a 63 y.o. male     MRN : [id number]     DOB: Nov 07, 1950  Procedure Date: 06/29/2013  Nuclear Med Background Indication for Stress Test:  Graft Patency and Stent Patency History:  CAD;MI-06/29/2011;STENT/PTCA-2009,2011 AND 2013;CABG -07/2010;ISCHEMIC CARDIOMYOPATHY;Last Lexi MPI on 07/30/2011-nonischemic-scar;EF=37%;ECHO 07/2011-EF=38% Cardiac Risk Factors: Family History - CAD, History of Smoking, Hypertension, NIDDM and Obesity  Symptoms:  DOE and Fatigue   Nuclear Pre-Procedure Caffeine/Decaff Intake:  7:00pm NPO After: 5:00am   IV Site: R Forearm  IV 0.9% NS with Angio Cath:  22g  Chest Size (in):  52"  IV Started by: Rolene Course, RN  Height: 5\' 11"  (1.803 m)  Cup Size: n/a  BMI:  Body mass index is 47.44 kg/(m^2). Weight:  340 lb (154.223 kg)   Tech Comments:  n/a    Nuclear Med Study 1 or 2 day study: 1 day  Stress Test Type:  Stress  Order Authorizing Provider:  Shelva Majestic, MD   Resting Radionuclide: Technetium 61m Sestamibi  Resting Radionuclide Dose: 12.1 mCi   Stress Radionuclide:  Technetium 51m Sestamibi  Stress Radionuclide Dose: 36.2 mCi           Stress Protocol Rest HR: 73 Stress HR:150  Rest BP: 122/85 Stress BP: 189/77  Exercise Time (min): 5:25 METS: 6.00   Predicted Max HR: 157 bpm % Max HR: 95.54 bpm Rate Pressure Product: 28350  Dose of Adenosine (mg):  n/a Dose of Lexiscan: n/a mg  Dose of Atropine (mg): n/a Dose of Dobutamine: n/a mcg/kg/min (at max HR)  Stress Test Technologist: Mellody Memos, CCT Nuclear Technologist: Imagene Riches, CNMT   Rest Procedure:  Myocardial perfusion imaging was performed at rest 45 minutes following the intravenous administration of Technetium 34m Sestamibi. Stress Procedure:  The patient performed treadmill exercise using a Bruce  Protocol for 5  minutes 25 seconds. The patient stopped due to extreme shortness of breath and fatigue. Patient denied any chest pain.  There were no significant ST-T wave changes.  Technetium 54m Sestamibi was injected IV at peak exercise and myocardial perfusion imaging was performed after a brief delay.  Transient Ischemic Dilatation (Normal <1.22): 0.79 Lung/Heart Ratio (Normal <0.45):  0.39 QGS EDV:  250 ml QGS ESV:  167 ml LV Ejection Fraction: 33%        Rest ECG: NSR - Normal EKG and Anterior MI  Stress ECG: No significant ST segment change suggestive of ischemia.  QPS Raw Data Images:  Normal; no motion artifact; normal heart/lung ratio. Stress Images:  There is decreased uptake in the anterior wall. Rest Images:  There is decreased uptake in the anterior wall. Subtraction (SDS):  There is a fixed defect that is most consistent with a previous infarction.  Impression Exercise Capacity:  Good exercise capacity. BP Response:  Normal blood pressure response. Clinical Symptoms:  No significant symptoms noted. ECG Impression:  No significant ST segment change suggestive of ischemia. Comparison with Prior Nuclear Study: No significant change from previous study  Overall Impression:  Low risk stress nuclear study Scar anteroapical/ Anterior and ineroapical.  LV Wall Motion:  Severe LV dysfunction with scar in the LAD territory   Lorretta Harp, MD  06/29/2013 1:45 PM

## 2013-07-21 ENCOUNTER — Telehealth: Payer: Self-pay | Admitting: Cardiovascular Disease

## 2013-07-23 NOTE — Telephone Encounter (Signed)
Closed encounter °

## 2013-08-02 ENCOUNTER — Ambulatory Visit (INDEPENDENT_AMBULATORY_CARE_PROVIDER_SITE_OTHER): Payer: 59 | Admitting: Cardiology

## 2013-08-02 ENCOUNTER — Encounter: Payer: Self-pay | Admitting: Cardiology

## 2013-08-02 VITALS — BP 142/86 | HR 82 | Ht 73.0 in | Wt 347.0 lb

## 2013-08-02 DIAGNOSIS — I2589 Other forms of chronic ischemic heart disease: Secondary | ICD-10-CM

## 2013-08-02 DIAGNOSIS — I2510 Atherosclerotic heart disease of native coronary artery without angina pectoris: Secondary | ICD-10-CM

## 2013-08-02 DIAGNOSIS — I255 Ischemic cardiomyopathy: Secondary | ICD-10-CM

## 2013-08-02 MED ORDER — LISINOPRIL 2.5 MG PO TABS: 2.5000 mg | ORAL_TABLET | Freq: Every day | ORAL | Status: DC

## 2013-08-02 MED ORDER — CARVEDILOL 6.25 MG PO TABS: 6.2500 mg | ORAL_TABLET | Freq: Two times a day (BID) | ORAL | Status: DC

## 2013-08-02 NOTE — Patient Instructions (Signed)
1. We are increasing your coreg to 6.25 mg two times a day  2. We are adding Lisinopril 2.5 mg daily  3.we are ordering an echo to be scheduled today  4. We will look at the Echo and determine from that when to have se Dr. Claiborne Billings

## 2013-08-02 NOTE — Progress Notes (Signed)
Patient ID: Gregory Wiley, male   DOB: 1950-12-20, 63 y.o.   MRN: [id number]    08/02/2013 [place]   1950/04/17  [id number]  Primary Physicia Tamsen Roers, MD Primary Cardiologist: Dr. Claiborne Billings Gregory Wiley is a patient that is followed by Dr. Claiborne Billings who presents to clinic for repeat cardiac evaluation and to review the results of his recent stress test. Details of his cardiovascular history and history of present illness are listed below.  HPI:  Gregory Wiley is a 63 year old gentleman who has established coronary artery disease. He underwent initial stenting of his right coronary artery in May 2011. Subsequently, he underwent CABG surgery with LIMA to the LAD, sequential vein to the distal RCA and PDA, and vein to the acute marginal branch of his right coronary artery. In April 2013 he developed unstable angina and ruled in for non-ST segment elevation myocardial infarction. Catheterization revealed 99% stenosis of the large left circumflex vessel and he underwent successful stenting with insertion of a 4.0x18 mm resolute DES stent post dilated to 4.5 mm. An echo Doppler study in May 2013 suggested an ejection fraction of 38%. And a nuclear imaging this was 37% with scar in the LAD territory.   Additional problems include obstructive sleep apnea, compliant with CPAP. He also has a history of hyperlipidemia, hypertension, and morbid obesity. At his last office visit with Dr. Claiborne Billings on 01/04/13, he recommended that in 6 months time, he undergo a two-year followup exercise Myoview. This was performed on 07/04/13. Severe LV dysfunction with scar in the LAD territory was noted, however this was not significantly changed from previous study and overall impression was a low risk stress nuclear study. His EF at time of the nuclear study however was slightly reduced compared to prior and was below 35%, estimated at 33%.  He presents back to clinic today for followup. He walks on average 2-3 miles per day for  exercise. He denies any exertional chest discomfort however he states that on occasion he has some mild dyspnea on exertion and at times feels a bit fatigued. He denies any lower extremity edema. No orthopnea or PND. He also denies palpitations, dizziness, syncope/near syncope. He reports full medication compliance.  Current Outpatient Prescriptions  Medication Sig Dispense Refill  . aspirin 325 MG tablet Take 325 mg by mouth daily.      . clopidogrel (PLAVIX) 75 MG tablet Take 1 tablet (75 mg total) by mouth daily.  90 tablet  1  . furosemide (LASIX) 40 MG tablet Take 20 mg by mouth daily.      . isosorbide mononitrate (IMDUR) 30 MG 24 hr tablet Take 0.5 tablets (15 mg total) by mouth daily.  45 tablet  1  . nitroGLYCERIN (NITROSTAT) 0.4 MG SL tablet Place 0.4 mg under the tongue every 5 (five) minutes as needed. For chest pain      . peg 3350 powder (MOVIPREP) 100 G SOLR Take 1 kit (200 g total) by mouth once.  1 kit  0  . ranolazine (RANEXA) 500 MG 12 hr tablet Take 1 tablet (500 mg total) by mouth 2 (two) times daily.  180 tablet  1  . simvastatin (ZOCOR) 40 MG tablet Take 40 mg by mouth every evening.      . carvedilol (COREG) 6.25 MG tablet Take 1 tablet (6.25 mg total) by mouth 2 (two) times daily.  180 tablet  3  . lisinopril (PRINIVIL,ZESTRIL) 2.5 MG tablet Take 1 tablet (2.5 mg total) by mouth daily.  90 tablet  3   No current facility-administered medications for this visit.    Allergies  Allergen Reactions  . Diphenhydramine Hcl Anaphylaxis    History   Social History  . Marital Status: Married    Spouse Name: N/A    Number of Children: 45  . Years of Education: N/A   Occupational History  .      works at the [employer]  . Smoking status: Former Research scientist (life sciences)  . Smokeless tobacco: Never Used  . Alcohol Use: Yes     Comment: 1-2 times per year  . Drug Use: No  . Sexual Activity: Not on file   Other Topics Concern  . Not on file   Social  History Narrative  . No narrative on file     Review of Systems: General: negative for chills, fever, night sweats or weight changes.  Cardiovascular: negative for chest pain, dyspnea on exertion, edema, orthopnea, palpitations, paroxysmal nocturnal dyspnea or shortness of breath Dermatological: negative for rash Respiratory: negative for cough or wheezing Urologic: negative for hematuria Abdominal: negative for nausea, vomiting, diarrhea, bright red blood per rectum, melena, or hematemesis Neurologic: negative for visual changes, syncope, or dizziness All other systems reviewed and are otherwise negative except as noted above.    Blood pressure 142/86, pulse 82, height _0  (1.854 m), weight 347 lb (157.398 kg).  General appearance: alert, cooperative and no distress Neck: no carotid bruit and no JVD Lungs: clear to auscultation bilaterally Heart: regular rate and rhythm, S1, S2 normal, no murmur, click, rub or gallop Extremities: no LEE Pulses: 2+ and symmetric Skin: warm and dry Neurologic: Grossly normal  EKG NSR HR 82 bpm  ASSESSMENT AND PLAN:   CAD, RCA PCI '09, 10/11, CABG X 4 5/12 His most recent nuclear stress test was interpreted as a low risk stress nuclear study. Severe LV dysfunction with scar in the LAD territory was noted, however this was not significantly changed from previous study. EF was estimated at 33%. He denies any exertional angina. Continue current medical regimen which includes dual antiplatelet therapy with aspirin and Plavix, Coreg, Imdur, Ranexa and simvastatin.  Ischemic cardiomyopathy, EF 35-40 2D May 2012 EF on most recent stress test is slightly reduced compared to last assessment and is also below 35%. His EF on nuclear stress testing was estimated at 33%. In order to obtain a more accurate estimate of his true systolic function, we'll order a 2-D echo. If EF is indeed below 35%, he will need to followup with Dr. Claiborne Billings to see if an ICD is  recommended. For now, continue current heart failure medicines, which include Lasix and Coreg. His blood pressure is moderately elevated at 142/86. Pulse rate is 82. Will increase his Coreg to 6.25 mg twice a day. It also appears that he is not on an ACE or ARB. He was on a high dose of losartan several years ago, however this was reportedly discontinued due to hypotension. His renal function is normal. We'll retry a low-dose ACE inhibitor. We'll start with 2.5 mg of lisinopril daily and we'll see how he tolerates this.    PLAN   Will plan to check a 2-D echo for a better assessment of systolic function, mainly to see if his EF is truly less than 35%, as this may warrant ICD placement. He is now on  proper heart failure medicines including a diuretic, beta blocker and ACE inhibitor. He will need to followup in  1 to 2 weeks after 2-D echo is performed to see if an ICD is needed. In terms of his CAD, this appears to be stable as he has had no recurrent angina and recent nuclear stress test was interpreted as a low risk study. Continue aspirin, Plavix, Imdur, Ranexa, Coreg.  Brittainy SimmonsPA-C 08/02/2013 7:11 PM

## 2013-08-02 NOTE — Assessment & Plan Note (Signed)
EF on most recent stress test is slightly reduced compared to last assessment and is also below 35%. His EF on nuclear stress testing was estimated at 33%. In order to obtain a more accurate estimate of his true systolic function, we'll order a 2-D echo. If EF is indeed below 35%, he will need to followup with Dr. Claiborne Billings to see if an ICD as recommended. For now, continue current heart failure medicines, which include Lasix and Coreg. His blood pressure is moderately elevated at 142/86. Pulse rate is 82. We'll increase his Coreg to 6.25 mg twice a day. It also appears that he is not on an ACE or ARB. He was on a high dose of losartan several years ago, however this was reportedly discontinued due to hypotension. His renal function is normal. We'll retry a low-dose ACE inhibitor. We'll start with 2.5 mg of lisinopril daily and we'll see how he tolerates this.

## 2013-08-02 NOTE — Assessment & Plan Note (Signed)
He said nuclear stress test was interpreted as a low risk stress nuclear study. Severe LV dysfunction with scar in the LAD territory was noted, however this was not significantly changed from previous study. EF was estimated at 33%. He denies any exertional angina. Any current medical regimen which includes dual antiplatelet therapy with aspirin and Plavix, Coreg, Imdur, Ranexa and simvastatin.

## 2013-08-03 ENCOUNTER — Ambulatory Visit: Payer: 59 | Admitting: Cardiovascular Disease

## 2013-08-10 ENCOUNTER — Ambulatory Visit (HOSPITAL_COMMUNITY)
Admission: RE | Admit: 2013-08-10 | Discharge: 2013-08-10 | Disposition: A | Discharge status: Home / Self Care | Payer: 59 | Source: Ambulatory Visit | Attending: Cardiovascular Disease | Admitting: Cardiovascular Disease

## 2013-08-10 DIAGNOSIS — I2589 Other forms of chronic ischemic heart disease: Secondary | ICD-10-CM | POA: Insufficient documentation

## 2013-08-10 DIAGNOSIS — I255 Ischemic cardiomyopathy: Secondary | ICD-10-CM

## 2013-08-10 DIAGNOSIS — I517 Cardiomegaly: Secondary | ICD-10-CM

## 2013-08-10 NOTE — Progress Notes (Signed)
2D Echocardiogram Complete.  08/10/2013   Imoni Kohen, RDCS 

## 2013-08-24 ENCOUNTER — Other Ambulatory Visit: Payer: Self-pay | Admitting: Dermatology

## 2013-09-29 NOTE — Telephone Encounter (Signed)
Encounter complete. 

## 2014-01-16 DIAGNOSIS — I639 Cerebral infarction, unspecified: Secondary | ICD-10-CM

## 2014-01-16 HISTORY — DX: Cerebral infarction, unspecified: I63.9

## 2014-01-16 HISTORY — PX: ESOPHAGOGASTRODUODENOSCOPY: SHX1529

## 2014-01-27 ENCOUNTER — Encounter: Payer: Self-pay | Admitting: *Deleted

## 2014-01-27 ENCOUNTER — Telehealth: Payer: Self-pay | Admitting: Gastroenterology

## 2014-01-27 NOTE — Telephone Encounter (Signed)
I spoke with Gregory Wiley.  She will send notes and advise the patient of the appt for 02/01/14 3:15

## 2014-02-01 ENCOUNTER — Ambulatory Visit (INDEPENDENT_AMBULATORY_CARE_PROVIDER_SITE_OTHER): Payer: 59 | Admitting: Gastroenterology

## 2014-02-01 ENCOUNTER — Encounter: Payer: Self-pay | Admitting: Gastroenterology

## 2014-02-01 VITALS — BP 138/64 | HR 76 | Ht 73.0 in | Wt 347.0 lb

## 2014-02-01 DIAGNOSIS — K921 Melena: Secondary | ICD-10-CM

## 2014-02-01 DIAGNOSIS — D62 Acute posthemorrhagic anemia: Secondary | ICD-10-CM

## 2014-02-01 DIAGNOSIS — R195 Other fecal abnormalities: Secondary | ICD-10-CM

## 2014-02-01 NOTE — Patient Instructions (Signed)
You have been scheduled for an endoscopy. Please follow written instructions given to you at your visit today. If you use inhalers (even only as needed), please bring them with you on the day of your procedure. Your physician has requested that you go to www.startemmi.com and enter the access code given to you at your visit today. This web site gives a general overview about your procedure. However, you should still follow specific instructions given to you by our office regarding your preparation for the procedure.  TR:RNHAF Little, MD

## 2014-02-01 NOTE — Progress Notes (Signed)
    History of Present Illness: This is a 63 year old male with melena and Hemoccult positive stool. He was evaluated for his PCP for black stools for 3 days with DRE showing stool testing ++ for occult blood. He was advised to stop ASA/NSAIDs and Plavix last Thursday. Omeprazole was started. Hb decreased from 12 to 10.8. Stools are now brown. He underwent colonoscopy in November 2014 showing 1 small adenomatous colon polyp, 1 hyperplastic colon polyp, left sided diverticulosis and small internal hemorrhoids. He has infrequent reflux symptoms. UGI series in 2006 showed a small HH and faint GE reflux. Denies weight loss, abdominal pain, constipation, diarrhea, change in stool caliber, hematochezia, nausea, vomiting, dysphagia, reflux symptoms, chest pain.  Current Medications, Allergies, Past Medical History, Past Surgical History, Family History and Social History were reviewed in [employer] record.  Physical Exam: General: Well developed , well nourished, no acute distress Head: Normocephalic and atraumatic Eyes:  sclerae anicteric, EOMI Ears: Normal auditory acuity Mouth: No deformity or lesions Lungs: Clear throughout to auscultation Heart: Regular rate and rhythm; no murmurs, rubs or bruits Abdomen: Soft, non tender and non distended. No masses, hepatosplenomegaly or hernias noted. Normal Bowel sounds Rectal: per PCP-dark and heme ++ stool Musculoskeletal: Symmetrical with no gross deformities  Pulses:  Normal pulses noted Extremities: No clubbing, cyanosis, edema or deformities noted Neurological: Alert oriented x 4, grossly nonfocal Psychological:  Alert and cooperative. Normal mood and affect  Assessment and Recommendations:  1. Melena, heme pos stool, anemia concerning for UGI blood loss. R/O ulcer, gastritis, other sources. Continue omeprazole bid and remain off ASA/NSAIDs/Plavix for now. Schedule EGD tomorrow. The risks, benefits, and alternatives to  endoscopy with possible biopsy and possible dilation were discussed with the patient and they consent to proceed.   2. Personal history of adenomatous colon polyps. Surveillance colonoscopy due in 01/2018.

## 2014-02-02 ENCOUNTER — Encounter: Payer: Self-pay | Admitting: Gastroenterology

## 2014-02-02 ENCOUNTER — Ambulatory Visit (AMBULATORY_SURGERY_CENTER): Payer: 59 | Admitting: Gastroenterology

## 2014-02-02 VITALS — BP 131/71 | HR 63 | Temp 98.6°F | Resp 15 | Ht 73.0 in | Wt 347.0 lb

## 2014-02-02 DIAGNOSIS — K921 Melena: Secondary | ICD-10-CM

## 2014-02-02 DIAGNOSIS — R195 Other fecal abnormalities: Secondary | ICD-10-CM

## 2014-02-02 DIAGNOSIS — K259 Gastric ulcer, unspecified as acute or chronic, without hemorrhage or perforation: Secondary | ICD-10-CM

## 2014-02-02 DIAGNOSIS — K2950 Unspecified chronic gastritis without bleeding: Secondary | ICD-10-CM

## 2014-02-02 MED ORDER — SODIUM CHLORIDE 0.9 % IV SOLN: 500.0000 mL | INTRAVENOUS | Status: DC

## 2014-02-02 NOTE — Progress Notes (Signed)
Called to room to assist during endoscopic procedure.  Patient ID and intended procedure confirmed with present staff. Received instructions for my participation in the procedure from the performing physician.  

## 2014-02-02 NOTE — Patient Instructions (Addendum)

## 2014-02-02 NOTE — Op Note (Signed)
[facility]  [address]. [place], [address]   ENDOSCOPY PROCEDURE REPORT PATIENT: Gregory Wiley, Gregory Wiley  MR#: [id number] BIRTHDATE: 1950/05/09 , 21  yrs. old GENDER: male ENDOSCOPIST: Ladene Artist, MD, [facility] PROCEDURE DATE:  02/02/2014 PROCEDURE:  EGD w/ biopsy ASA CLASS:     Class III INDICATIONS:  melena and hemocult positive stool. MEDICATIONS: Monitored anesthesia care and Propofol 150 mg IV TOPICAL ANESTHETIC: none DESCRIPTION OF PROCEDURE: After the risks benefits and alternatives of the procedure were thoroughly explained, informed consent was obtained.  The LB [id number] endoscope was introduced through the mouth and advanced to the second portion of the duodenum , Without limitations.  The instrument was slowly withdrawn as the mucosa was fully examined.  STOMACH: A single non-bleeding, clean-based, shallow and irregular shaped ulcer measuring 5 x 3mm in size was found in the gastric antrum.  Biopsies were taken around the ulcer and at edge of the ulcer.  Moderate erosive gastritis (inflammation) was found in the gastric antrum. DUODENUM: Moderate erosive duodenitis was found in the duodenal bulb. 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  The stricture was easily traversable.  The esophagus was otherwise normal.  Retroflexed views revealed a small hiatal hernia.   The scope was then withdrawn from the patient and the procedure completed.  COMPLICATIONS: There were no immediate complications.  ENDOSCOPIC IMPRESSION: 1.   Single gastric antrum ulcer; biopsies were taken 2.   Erosive gastritis in the antrum 3.   Erosive duodenitis in the duodenal bulb 4.   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   Small hiatal hernia  RECOMMENDATIONS: 1.  Anti-reflux regimen long term 2.  Await pathology results 3.  Continue PPI long term 4.  Resume Plavix tomorrow. OK to resume ASA 81  mg daily in 2 weeks but avoid all other ASA/NSAID useage long term 5.  Endoscopy in 3 months to document ulcer healing  eSigned:  Ladene Artist, MD, [facility] 02/02/2014 2:45 PM   KK:DPTEL Little, MD

## 2014-02-02 NOTE — Progress Notes (Signed)
A/ox3, pleased with MAC, report to RN 

## 2014-02-02 NOTE — Progress Notes (Signed)
Verbal order given from Dr.Stark for patient to hold his Plavix 5 days prior to EGD. 3 month follow up from today. EGD appointment made 05-03-2014. Pt aware.

## 2014-02-03 ENCOUNTER — Telehealth: Payer: Self-pay | Admitting: *Deleted

## 2014-02-03 NOTE — Telephone Encounter (Signed)
  Follow up Call-  Call back number 02/02/2014  Post procedure Call Back phone  # [phone]  Permission to leave phone message Yes     Patient questions:  Do you have a fever, pain , or abdominal swelling? No. Pain Score  0 *  Have you tolerated food without any problems? Yes.    Have you been able to return to your normal activities? Yes.    Do you have any questions about your discharge instructions: Diet   No. Medications  No. Follow up visit  No.  Do you have questions or concerns about your Care? No.  Actions: * If pain score is 4 or above: No action needed, pain <4.

## 2014-02-07 ENCOUNTER — Encounter: Payer: Self-pay | Admitting: Gastroenterology

## 2014-02-12 ENCOUNTER — Inpatient Hospital Stay (HOSPITAL_COMMUNITY): Payer: 59

## 2014-02-12 ENCOUNTER — Emergency Department (HOSPITAL_COMMUNITY): Payer: 59

## 2014-02-12 ENCOUNTER — Encounter (HOSPITAL_COMMUNITY): Payer: Self-pay | Admitting: Emergency Medicine

## 2014-02-12 ENCOUNTER — Inpatient Hospital Stay (HOSPITAL_COMMUNITY)
Admission: EM | Admit: 2014-02-12 | Discharge: 2014-02-15 | DRG: 065 | Disposition: A | Discharge status: Rehabilitation Facility | Payer: 59 | Attending: Internal Medicine | Admitting: Internal Medicine

## 2014-02-12 DIAGNOSIS — I255 Ischemic cardiomyopathy: Secondary | ICD-10-CM | POA: Diagnosis present

## 2014-02-12 DIAGNOSIS — Z79899 Other long term (current) drug therapy: Secondary | ICD-10-CM | POA: Diagnosis not present

## 2014-02-12 DIAGNOSIS — E119 Type 2 diabetes mellitus without complications: Secondary | ICD-10-CM | POA: Diagnosis present

## 2014-02-12 DIAGNOSIS — G8194 Hemiplegia, unspecified affecting left nondominant side: Secondary | ICD-10-CM | POA: Diagnosis present

## 2014-02-12 DIAGNOSIS — Z951 Presence of aortocoronary bypass graft: Secondary | ICD-10-CM

## 2014-02-12 DIAGNOSIS — I63411 Cerebral infarction due to embolism of right middle cerebral artery: Secondary | ICD-10-CM | POA: Diagnosis not present

## 2014-02-12 DIAGNOSIS — R4702 Dysphasia: Secondary | ICD-10-CM | POA: Diagnosis present

## 2014-02-12 DIAGNOSIS — Z9989 Dependence on other enabling machines and devices: Secondary | ICD-10-CM

## 2014-02-12 DIAGNOSIS — R339 Retention of urine, unspecified: Secondary | ICD-10-CM | POA: Diagnosis not present

## 2014-02-12 DIAGNOSIS — G4733 Obstructive sleep apnea (adult) (pediatric): Secondary | ICD-10-CM | POA: Diagnosis present

## 2014-02-12 DIAGNOSIS — D62 Acute posthemorrhagic anemia: Secondary | ICD-10-CM | POA: Diagnosis not present

## 2014-02-12 DIAGNOSIS — Z7902 Long term (current) use of antithrombotics/antiplatelets: Secondary | ICD-10-CM

## 2014-02-12 DIAGNOSIS — E785 Hyperlipidemia, unspecified: Secondary | ICD-10-CM | POA: Diagnosis present

## 2014-02-12 DIAGNOSIS — Z955 Presence of coronary angioplasty implant and graft: Secondary | ICD-10-CM | POA: Diagnosis not present

## 2014-02-12 DIAGNOSIS — R29810 Facial weakness: Secondary | ICD-10-CM | POA: Diagnosis present

## 2014-02-12 DIAGNOSIS — Z9119 Patient's noncompliance with other medical treatment and regimen: Secondary | ICD-10-CM | POA: Diagnosis not present

## 2014-02-12 DIAGNOSIS — I10 Essential (primary) hypertension: Secondary | ICD-10-CM | POA: Diagnosis present

## 2014-02-12 DIAGNOSIS — Z87891 Personal history of nicotine dependence: Secondary | ICD-10-CM

## 2014-02-12 DIAGNOSIS — R471 Dysarthria and anarthria: Secondary | ICD-10-CM | POA: Diagnosis present

## 2014-02-12 DIAGNOSIS — Z6841 Body Mass Index (BMI) 40.0 and over, adult: Secondary | ICD-10-CM | POA: Diagnosis not present

## 2014-02-12 DIAGNOSIS — H53462 Homonymous bilateral field defects, left side: Secondary | ICD-10-CM | POA: Diagnosis present

## 2014-02-12 DIAGNOSIS — I2510 Atherosclerotic heart disease of native coronary artery without angina pectoris: Secondary | ICD-10-CM | POA: Diagnosis present

## 2014-02-12 DIAGNOSIS — K219 Gastro-esophageal reflux disease without esophagitis: Secondary | ICD-10-CM | POA: Diagnosis present

## 2014-02-12 DIAGNOSIS — I63511 Cerebral infarction due to unspecified occlusion or stenosis of right middle cerebral artery: Principal | ICD-10-CM | POA: Diagnosis present

## 2014-02-12 DIAGNOSIS — Z7982 Long term (current) use of aspirin: Secondary | ICD-10-CM

## 2014-02-12 DIAGNOSIS — I639 Cerebral infarction, unspecified: Secondary | ICD-10-CM

## 2014-02-12 DIAGNOSIS — I252 Old myocardial infarction: Secondary | ICD-10-CM

## 2014-02-12 HISTORY — DX: Plantar fascial fibromatosis: M72.2

## 2014-02-12 HISTORY — DX: Cerebral infarction, unspecified: I63.9

## 2014-02-12 LAB — I-STAT CHEM 8, ED
BUN: 13 mg/dL (ref 6–23)
Calcium, Ion: 1.09 mmol/L — ABNORMAL LOW (ref 1.13–1.30)
Chloride: 109 mEq/L (ref 96–112)
Creatinine, Ser: 1.1 mg/dL (ref 0.50–1.35)
Glucose, Bld: 144 mg/dL — ABNORMAL HIGH (ref 70–99)
HCT: 39 % (ref 39.0–52.0)
Hemoglobin: 13.3 g/dL (ref 13.0–17.0)
Potassium: 3.8 mEq/L (ref 3.7–5.3)
Sodium: 139 mEq/L (ref 137–147)
TCO2: 22 mmol/L (ref 0–100)

## 2014-02-12 LAB — DIFFERENTIAL
Basophils Absolute: 0.1 10*3/uL (ref 0.0–0.1)
Basophils Relative: 1 % (ref 0–1)
Eosinophils Absolute: 0.4 10*3/uL (ref 0.0–0.7)
Eosinophils Relative: 5 % (ref 0–5)
Lymphocytes Relative: 34 % (ref 12–46)
Lymphs Abs: 2.3 10*3/uL (ref 0.7–4.0)
Monocytes Absolute: 0.5 10*3/uL (ref 0.1–1.0)
Monocytes Relative: 8 % (ref 3–12)
Neutro Abs: 3.5 10*3/uL (ref 1.7–7.7)
Neutrophils Relative %: 52 % (ref 43–77)

## 2014-02-12 LAB — URINALYSIS, ROUTINE W REFLEX MICROSCOPIC
Bilirubin Urine: NEGATIVE
Glucose, UA: NEGATIVE mg/dL
Hgb urine dipstick: NEGATIVE
Ketones, ur: NEGATIVE mg/dL
Nitrite: NEGATIVE
Protein, ur: NEGATIVE mg/dL
Specific Gravity, Urine: 1.015 (ref 1.005–1.030)
Urobilinogen, UA: 0.2 mg/dL (ref 0.0–1.0)
pH: 8 (ref 5.0–8.0)

## 2014-02-12 LAB — PROTIME-INR
INR: 1.04 (ref 0.00–1.49)
Prothrombin Time: 13.7 seconds (ref 11.6–15.2)

## 2014-02-12 LAB — CBC
HCT: 37.5 % — ABNORMAL LOW (ref 39.0–52.0)
Hemoglobin: 12 g/dL — ABNORMAL LOW (ref 13.0–17.0)
MCH: 27.7 pg (ref 26.0–34.0)
MCHC: 32 g/dL (ref 30.0–36.0)
MCV: 86.6 fL (ref 78.0–100.0)
Platelets: 197 10*3/uL (ref 150–400)
RBC: 4.33 MIL/uL (ref 4.22–5.81)
RDW: 13.8 % (ref 11.5–15.5)
WBC: 6.7 10*3/uL (ref 4.0–10.5)

## 2014-02-12 LAB — COMPREHENSIVE METABOLIC PANEL
ALT: 11 U/L (ref 0–53)
AST: 15 U/L (ref 0–37)
Albumin: 3.2 g/dL — ABNORMAL LOW (ref 3.5–5.2)
Alkaline Phosphatase: 78 U/L (ref 39–117)
Anion gap: 13 (ref 5–15)
BUN: 13 mg/dL (ref 6–23)
CO2: 21 mEq/L (ref 19–32)
Calcium: 8.5 mg/dL (ref 8.4–10.5)
Chloride: 106 mEq/L (ref 96–112)
Creatinine, Ser: 1.09 mg/dL (ref 0.50–1.35)
GFR calc Af Amer: 81 mL/min — ABNORMAL LOW (ref >90)
GFR calc non Af Amer: 70 mL/min — ABNORMAL LOW (ref >90)
Glucose, Bld: 137 mg/dL — ABNORMAL HIGH (ref 70–99)
Potassium: 4.1 mEq/L (ref 3.7–5.3)
Sodium: 140 mEq/L (ref 137–147)
Total Bilirubin: 0.2 mg/dL — ABNORMAL LOW (ref 0.3–1.2)
Total Protein: 7.1 g/dL (ref 6.0–8.3)

## 2014-02-12 LAB — RAPID URINE DRUG SCREEN, HOSP PERFORMED
Amphetamines: NOT DETECTED
Barbiturates: NOT DETECTED
Benzodiazepines: NOT DETECTED
Cocaine: NOT DETECTED
Opiates: NOT DETECTED
Tetrahydrocannabinol: NOT DETECTED

## 2014-02-12 LAB — URINE MICROSCOPIC-ADD ON

## 2014-02-12 LAB — APTT: aPTT: 31 seconds (ref 24–37)

## 2014-02-12 LAB — ETHANOL: Alcohol, Ethyl (B): <11 mg/dL (ref 0–11)

## 2014-02-12 LAB — I-STAT TROPONIN, ED: Troponin i, poc: 0 ng/mL (ref 0.00–0.08)

## 2014-02-12 MED ORDER — ASPIRIN 300 MG RE SUPP
300.0000 mg | Freq: Every day | RECTAL | Status: DC
  Administered 2014-02-12: 300 mg via RECTAL
  Filled 2014-02-12: qty 1

## 2014-02-12 MED ORDER — SODIUM CHLORIDE 0.9 % IJ SOLN
10.0000 mL | INTRAMUSCULAR | Status: DC | PRN
  Administered 2014-02-13: 10 mL
  Administered 2014-02-14: 30 mL
  Filled 2014-02-12 (×2): qty 40

## 2014-02-12 MED ORDER — ASPIRIN 325 MG PO TABS
325.0000 mg | ORAL_TABLET | Freq: Every day | ORAL | Status: DC
  Administered 2014-02-13: 325 mg via ORAL
  Filled 2014-02-12: qty 1

## 2014-02-12 MED ORDER — SODIUM CHLORIDE 0.9 % IV SOLN
INTRAVENOUS | Status: AC
  Administered 2014-02-12: 18:00:00 via INTRAVENOUS

## 2014-02-12 MED ORDER — ACETAMINOPHEN 325 MG PO TABS
650.0000 mg | ORAL_TABLET | Freq: Four times a day (QID) | ORAL | Status: DC | PRN
  Administered 2014-02-13 – 2014-02-15 (×3): 650 mg via ORAL
  Filled 2014-02-12 (×3): qty 2

## 2014-02-12 MED ORDER — ACETAMINOPHEN 650 MG RE SUPP
650.0000 mg | Freq: Four times a day (QID) | RECTAL | Status: DC | PRN
  Administered 2014-02-13: 650 mg via RECTAL
  Filled 2014-02-12: qty 1

## 2014-02-12 MED ORDER — STROKE: EARLY STAGES OF RECOVERY BOOK
Freq: Once | Status: AC
  Administered 2014-02-12: 16:00:00
  Filled 2014-02-12: qty 1

## 2014-02-12 MED ORDER — IOHEXOL 350 MG/ML SOLN: 50.0000 mL | INTRAVENOUS | Status: DC | PRN

## 2014-02-12 MED ORDER — SODIUM CHLORIDE 0.9 % IJ SOLN
INTRAMUSCULAR | Status: AC
  Filled 2014-02-12: qty 1250

## 2014-02-12 NOTE — ED Notes (Signed)
Pt arrived with wife. Pt went to bed feeling normal last night around 12 midnight and then woke up this morning around 0830. Pt stood up and then fell. Denies hitting head. Pt has left sided weakness, right sided gaze and slurred speech. Denies any pain at this time.

## 2014-02-12 NOTE — ED Notes (Signed)
IV team at bedside attempting 2nd IV site. Current IV site is not accepting contrast per CT and leaking around site. IV site has good blood return and flushes well.

## 2014-02-12 NOTE — H&P (Signed)
Date: 02/12/2014               Patient Name:  Gregory Wiley MRN: [id number]  DOB: [birth year] Age / Sex: 63 y.o., male   PCP: Tamsen Roers, MD         Medical Service: Internal Medicine Teaching Service         Attending Physician: Dr. Annia Belt, MD    First Contact: Dr. Marvel Plan Pager: [phone]  Second Contact: Dr. Ronnald Ramp Pager: [phone]       After Hours (After 5p/  First Contact Pager: [phone]  weekends / holidays): Second Contact Pager: [phone]   Chief Complaint:   History of Present Illness: Mr. Plitt is a 63 y/o M with PMHx of MI with coronary angioplasty with stent x 3 2009/2012/2013, DM, HTN, HLD, CAD, obesity, OSA who presented to the ED with left facial droop and left arm weakness. Patient and wife state he woke up this morning and stated he didn't feel good and his head hurt. Patient was leaning to his left and fell forward, but did not hit his head. His wife could tell his speech was slightly slurred and brought him to the ED. In the ED, slurred speech, left arm and left leg weakness, and left facial droop progressively got worse. Patient was also intermittently confused. He currently complains of a headache and reflux. Patient has never had a stroke before, but has had an MI s/p stent placement and CABG. Patient takes ASA and Plavix at home, but he does not take his medications regularly. He recently went of plavix and ASA for an EGD to evaluate his PUD. Patient was restarted on his home regimen, but has not been very compliant.   Meds: Current Facility-Administered Medications  Medication Dose Route Frequency Provider Last Rate Last Dose  .  stroke: mapping our early stages of recovery book   Does not apply Once Corky Sox, MD      . acetaminophen (TYLENOL) suppository 650 mg  650 mg Rectal Q6H PRN Osa Craver, MD       Or  . acetaminophen (TYLENOL) tablet 650 mg  650 mg Oral Q6H PRN Osa Craver, MD      . aspirin suppository 300 mg  300 mg Rectal  Daily Corky Sox, MD   300 mg at 02/12/14 1415   Or  . aspirin tablet 325 mg  325 mg Oral Daily Corky Sox, MD      . sodium chloride 0.9 % injection             Allergies: Allergies as of 02/12/2014 - Review Complete 02/12/2014  Allergen Reaction Noted  . Diphenhydramine hcl Anaphylaxis 06/30/2011   Past Medical History  Diagnosis Date  . Coronary artery disease     stent, then CABG 07/20/10  . MI (myocardial infarction)     NSTEMI- last cath 06/2011-Stent to LCX-DES, last nuc 07/30/11 low risk  . Diabetes mellitus   . Hypertension   . Hyperlipidemia   . Obesity (BMI 30-39.9)   . OSA on CPAP     since July 2013  . H/O cardiomyopathy     ischemic, now last echo 07/30/11, EF 38%W  . Diverticulosis   . Internal hemorrhoids   . Tubular adenoma of colon    Past Surgical History  Procedure Laterality Date  . Coronary artery bypass graft  07/20/2010     LIMA-LAD; VG-ACUTE MARG of RCA; Seq VG-distal RCA & then pda  . Coronary  angioplasty with stent placement  07/01/2011    DES-Resolute to native LCX  . Coronary angioplasty with stent placement  12/01/2007    COMPLEX 5 LESION PCI INCLUDING CUTTING BALLOON AND 4 CYPHER DESs  . Hernia repair  2006  . Knee surgery Right   . Hand surgery      to take glass out  . Colonoscopy N/A 02/10/2013    Procedure: COLONOSCOPY;  Surgeon: Ladene Artist, MD;  Location: [facility];  Service: Endoscopy;  Laterality: N/A;  . Retinal detachment surgery     Family History  Problem Relation Age of Onset  . Diabetes type II Mother   . Coronary artery disease    . Colon cancer Neg Hx   . CAD Father 38    CABG, PCI, died at 41  . Heart attack Father 23  . Hypertension Brother   . Diabetes Brother   . Hyperlipidemia Brother   . Brain cancer Maternal Grandmother   . COPD Paternal Grandfather    History   Social History  . Marital Status: Married    Spouse Name: N/A    Number of Children: 23  . Years of Education: N/A   Occupational  History  .      works at the [employer] Topics  . Smoking status: Former Research scientist (life sciences)  . Smokeless tobacco: Never Used  . Alcohol Use: Yes     Comment: 1-2 times per year  . Drug Use: No  . Sexual Activity: Not on file   Other Topics Concern  . Not on file   Social History Narrative    Review of Systems: General: Denies fever, chills, fatigue, change in appetite and diaphoresis.  Respiratory: Denies SOB, cough, DOE, chest tightness, and wheezing.   Cardiovascular: Denies chest pain and palpitations.  Gastrointestinal: Admits to nausea and reflux, but denies vomiting, abdominal pain, diarrhea, constipation, blood in stool and abdominal distention.  Genitourinary: Denies dysuria, urgency, frequency, hematuria, suprapubic pain and flank pain. Endocrine: Denies hot or cold intolerance, polyuria, and polydipsia. Musculoskeletal: Denies myalgias, back pain, joint swelling, arthralgias and gait problem.  Skin: Denies pallor, rash and wounds.  Neurological: Admits to headache, weakness, numbness but denies dizziness, seizures, and syncope. Psychiatric/Behavioral: Denies mood changes, confusion, nervousness, sleep disturbance and agitation.  Physical Exam: Filed Vitals:   02/12/14 1007 02/12/14 1120 02/12/14 1215 02/12/14 1310  BP:  100/54  140/65  Pulse:  50  61  Temp:   97.6 F (36.4 C) 97.7 F (36.5 C)  TempSrc:    Oral  Resp:      Height:      Weight:      SpO2: 95% 99%  100%   General: Vital signs reviewed.  Patient is well-developed and well-nourished, in no acute distress and cooperative with exam.  Head: Normocephalic and atraumatic. Eyes: Right gaze preference. Unable to look left, upper left or lower left quadrants to exam. PERRLA. EOMI NOT intact. Cardiovascular: RRR, S1 normal, S2 normal, no murmurs, gallops, or rubs. Sternotomy scar. Pulmonary/Chest: Clear to auscultation bilaterally, no wheezes, rales, or rhonchi. Abdominal: Soft, obese,  non-tender, non-distended, BS +. Musculoskeletal: No joint deformities, erythema, or stiffness, ROM full and nontender. Extremities: No lower extremity edema bilaterally,  pulses symmetric and intact bilaterally. No cyanosis or clubbing. Neurological: A&Ox3, Strength 0/5 in L upper extremity and left lower ext, cranial nerve II-XII are grossly intact, no focal motor deficit, sensory intact to light touch bilaterally.  Skin: Warm, dry and  intact. No rashes or erythema. Psychiatric: Normal mood and affect. Speech and behavior is normal. Cognition and memory are normal.   Neurologic Exam:   Mental Status: Somnolent, oriented, thought content appropriate.  Speech slurred with evidence of aphasia.   Cranial Nerves:   II: Discs flat bilaterally. Visual fields grossly intact.  III/IV/VI: Right gaze preference, inability to gaze left.  Pupils reactive bilaterally.  V/VII: Smile asymmetric, left facial droop. facial light touch sensation diminished on left bilaterally.  VIII: Grossly intact.  IX/X: Normal gag.  XI: Bilateral shoulder shrug normal.  XII: Midline tongue extension normal.  Motor:  0/5 on left, 5/5 on right  Sensory:  Pinprick and light touch diminished on left  DTRs: 2+ and symmetric throughout         Lab results: Basic Metabolic Panel:  Recent Labs  02/12/14 0950 02/12/14 1004  NA 140 139  K 4.1 3.8  CL 106 109  CO2 21  --   GLUCOSE 137* 144*  BUN 13 13  CREATININE 1.09 1.10  CALCIUM 8.5  --    Liver Function Tests:  Recent Labs  02/12/14 0950  AST 15  ALT 11  ALKPHOS 78  BILITOT 0.2*  PROT 7.1  ALBUMIN 3.2*   CBC:  Recent Labs  02/12/14 0950 02/12/14 1004  WBC 6.7  --   NEUTROABS 3.5  --   HGB 12.0* 13.3  HCT 37.5* 39.0  MCV 86.6  --   PLT 197  --    Coagulation:  Recent Labs  02/12/14 0950  LABPROT 13.7  INR 1.04   Alcohol Level:  Recent Labs  02/12/14 0950  ETH <11   Imaging results:  Ct Head Wo Contrast  02/12/2014    CLINICAL DATA:  Left-sided weakness.  Slurred speech.  EXAM: CT HEAD WITHOUT CONTRAST  TECHNIQUE: Contiguous axial images were obtained from the base of the skull through the vertex without contrast.  COMPARISON:  08/15/2010  FINDINGS: No evidence for acute hemorrhage, mass lesion, midline shift, hydrocephalus or large infarct. Diffuse mucosal thickening in the maxillary sinuses suggesting chronic changes. Postsurgical changes in the maxillary sinuses bilaterally. Diffuse mucosal disease in the ethmoid air cells. No acute bone abnormality. Subtle low density in the right frontal white matter appears unchanged.  IMPRESSION: No acute intracranial abnormality.  Paranasal sinus disease.   Electronically Signed   By: Markus Daft M.D.   On: 02/12/2014 10:13   Other results: EKG: normal EKG, normal sinus rhythm.  Assessment & Plan by Problem: Active Problems:   CVA (cerebral infarction)  CVA: Symptoms are consistent with Right MCA ischemic stroke. Patient presented with left facial droop and left arm weakness this morning. Last known normal was at midnight, but he was originally a wake-up stroke at 0830. Symptoms became progressively worse in the ED with flaccid left extremities, right gaze preference without inability to abduct left eye to left and inability to adduct right to left. Patient also has loss of sensation in left lower extremity, slurred speech and left facial droop. Patient has no prior history of CVA. He takes aspirin 81 mg daily and plavix 75 mg daily at home, but has not been compliant with his medications. CT head showed no acute intracranial abnormalities and paranasal sinus disease. NIHSS 17. Patient was seen by Neurology who recommends stroke work up. -ASA 325 mg daily -Tylenol prn for headache -HgbA1c -Fasting lipid panel -CTA/CTP -MRI brain -2D Echocardiogram -Carotid dopplers -Risk factor modification -Telemetry monitoring -Frequent neuro checks -PT/OT/SLP -Bed  Rest -NPO -UDS  CAD: Patient has prior history of MI with coronary angioplasty with stent x 3 2009/2012/2013 . Patient is on ASA 81 mg daily, Coreg 6.25 mg BID, Plavix 75 mg daily, Nitro prn, Imdur 15 mg daily, Ranolazine 500 mg BID and simvastatin 40 mg daily at home. Patient was given ASA 300 mg rectally since he failed his swallow screen.  -ASA daily -Hold other medications while NPO  H/o DM: Last hemoglobin A1c 5.7 on 06/30/2011. Patient is not on any home diabetic medications.  -NPO  -Repeat HgbA1c  HTN: Patient is on coreg 6.25 mg BID at home. BP on admission was 126/63. -Allow for permissive hypertension  HLD: Patient is on simvastatin 40 mg daily at home.  -Hold while NPO  OSA: Patient occasionally uses CPAP at home. -Hold off on CPAP tonight, restart tomorrow if tolerable  DVT/PE ppx: SCDs  Dispo: Disposition is deferred at this time, awaiting improvement of current medical problems. Anticipated discharge in approximately 1-2 day(s).   The patient does have a current PCP Tamsen Roers, MD) and does not need an [facility] hospital follow-up appointment after discharge.  The patient does not have transportation limitations that hinder transportation to clinic appointments.  Signed: Osa Craver, DO PGY-1 Internal Medicine Resident Pager # [phone] 02/12/2014 2:36 PM

## 2014-02-12 NOTE — Consult Note (Addendum)
Referring Physician: ED    Chief Complaint: code stroke, left hemiparesis, dysarthria, left face weakness.  HPI:                                                                                                                                         Gregory Wiley is an 63 y.o. male, left handed, with a past medical history significant for HTN,hyperlipidemia, DM, CAD s/p CABG, MI, ischemic cardiomyopathy, OSA on CPAP, and obesity, brought in as a code stroke by EMS due to acute onset of the above stated symptoms. Wife is at the bedside and said that he went to bed at midnight and was well, woke up around 8:30 complaining of low back pain, had unsteady gait and fell to the floor. She said that she noticed that he was weak in the left side and had droopiness of the left face, and also had slurred speech. He did not improved and she called EMS. According to wife, these symptoms are worsening. Initial NIHSS 17.  CT brain showed no intracranial abnormality. At this time he is awake and follows simple commands but with sluggish response  Date last known well: 02/12/14 Time last known well: midnight tPA Given: no, patient out of the window for thrombolysis. NIHSS: 17    Past Medical History  Diagnosis Date  . Coronary artery disease     stent, then CABG 07/20/10  . MI (myocardial infarction)     NSTEMI- last cath 06/2011-Stent to LCX-DES, last nuc 07/30/11 low risk  . Diabetes mellitus   . Hypertension   . Hyperlipidemia   . Obesity (BMI 30-39.9)   . OSA on CPAP     since July 2013  . H/O cardiomyopathy     ischemic, now last echo 07/30/11, EF 38%W  . Diverticulosis   . Internal hemorrhoids   . Tubular adenoma of colon     Past Surgical History  Procedure Laterality Date  . Coronary artery bypass graft  07/20/2010     LIMA-LAD; VG-ACUTE MARG of RCA; Seq VG-distal RCA & then pda  . Coronary angioplasty with stent placement  07/01/2011    DES-Resolute to native LCX  . Coronary angioplasty  with stent placement  12/01/2007    COMPLEX 5 LESION PCI INCLUDING CUTTING BALLOON AND 4 CYPHER DESs  . Hernia repair  2006  . Knee surgery Right   . Hand surgery      to take glass out  . Colonoscopy N/A 02/10/2013    Procedure: COLONOSCOPY;  Surgeon: Malcolm T Stark, MD;  Location: [facility];  Service: Endoscopy;  Laterality: N/A;    Family History  Problem Relation Age of Onset  . Diabetes type II Mother   . Coronary artery disease    . Colon cancer Neg Hx   . CAD Father 53    CABG, PCI, died at 69  . Heart attack Father   4  . Hypertension Brother   . Diabetes Brother   . Hyperlipidemia Brother   . Brain cancer Maternal Grandmother   . COPD Paternal Grandfather    Social History:  reports that he has quit smoking. He has never used smokeless tobacco. He reports that he drinks alcohol. He reports that he does not use illicit drugs.  Allergies:  Allergies  Allergen Reactions  . Diphenhydramine Hcl Anaphylaxis    Medications:                                                                                                                           I have reviewed the patient's current medications.  ROS: unable to obtain due to mental status.                                                                                                         History obtained from chart review and family  Physical exam: no apparent distress. Blood pressure 126/63, pulse 60, temperature 98.6 F (37 C), temperature source Oral, resp. rate 17, height 6' (1.829 m), weight 158.759 kg (350 lb), SpO2 95 %. Head: normocephalic. Neck: supple, no bruits, no JVD. Cardiac: no murmurs. Lungs: clear. Abdomen: soft, no tender, no mass. Extremities: no edema. Neurologic Examination:                                                                                                      General: Mental Status: Awake, has a dysarthric speech and some receptive dysphasia. Cranial Nerves: II: Discs flat  bilaterally; Visual fields grossly normal, pupils equal, round, reactive to light and accommodation III,IV, VI: ptosis not present, right gaze preference V,VII: smile asymmetric due to left face weakbness, facial light touch sensation diminished in the left VIII: hearing normal bilaterally IX,X: gag reflex present XI: bilateral shoulder shrug no tested XII: midline tongue extension without atrophy or fasciculations Motor: Significant for left hemiparesis Tone and bulk:normal tone throughout; no atrophy noted Sensory: Pinprick and light touch diminished in the left Deep Tendon Reflexes:  1 all over Plantars: Right: downgoing   Left: mute Cerebellar: normal  finger-to-nose,  normal heel-to-shin in the right, can not perform in the left due to weakness Gait:  Unable to test   Results for orders placed or performed during the hospital encounter of 02/12/14 (from the past 48 hour(s))  Ethanol     Status: None   Collection Time: 02/12/14  9:50 AM  Result Value Ref Range   Alcohol, Ethyl (B) <11 0 - 11 mg/dL    Comment:        LOWEST DETECTABLE LIMIT FOR SERUM ALCOHOL IS 11 mg/dL FOR MEDICAL PURPOSES ONLY   Protime-INR     Status: None   Collection Time: 02/12/14  9:50 AM  Result Value Ref Range   Prothrombin Time 13.7 11.6 - 15.2 seconds   INR 1.04 0.00 - 1.49  APTT     Status: None   Collection Time: 02/12/14  9:50 AM  Result Value Ref Range   aPTT 31 24 - 37 seconds  CBC     Status: Abnormal   Collection Time: 02/12/14  9:50 AM  Result Value Ref Range   WBC 6.7 4.0 - 10.5 K/uL   RBC 4.33 4.22 - 5.81 MIL/uL   Hemoglobin 12.0 (L) 13.0 - 17.0 g/dL   HCT 37.5 (L) 39.0 - 52.0 %   MCV 86.6 78.0 - 100.0 fL   MCH 27.7 26.0 - 34.0 pg   MCHC 32.0 30.0 - 36.0 g/dL   RDW 13.8 11.5 - 15.5 %   Platelets 197 150 - 400 K/uL  Differential     Status: None   Collection Time: 02/12/14  9:50 AM  Result Value Ref Range   Neutrophils Relative % 52 43 - 77 %   Neutro Abs 3.5 1.7 - 7.7  K/uL   Lymphocytes Relative 34 12 - 46 %   Lymphs Abs 2.3 0.7 - 4.0 K/uL   Monocytes Relative 8 3 - 12 %   Monocytes Absolute 0.5 0.1 - 1.0 K/uL   Eosinophils Relative 5 0 - 5 %   Eosinophils Absolute 0.4 0.0 - 0.7 K/uL   Basophils Relative 1 0 - 1 %   Basophils Absolute 0.1 0.0 - 0.1 K/uL  Comprehensive metabolic panel     Status: Abnormal   Collection Time: 02/12/14  9:50 AM  Result Value Ref Range   Sodium 140 137 - 147 mEq/L   Potassium 4.1 3.7 - 5.3 mEq/L   Chloride 106 96 - 112 mEq/L   CO2 21 19 - 32 mEq/L   Glucose, Bld 137 (H) 70 - 99 mg/dL   BUN 13 6 - 23 mg/dL   Creatinine, Ser 1.09 0.50 - 1.35 mg/dL   Calcium 8.5 8.4 - 10.5 mg/dL   Total Protein 7.1 6.0 - 8.3 g/dL   Albumin 3.2 (L) 3.5 - 5.2 g/dL   AST 15 0 - 37 U/L   ALT 11 0 - 53 U/L   Alkaline Phosphatase 78 39 - 117 U/L   Total Bilirubin 0.2 (L) 0.3 - 1.2 mg/dL   GFR calc non Af Amer 70 (L) >90 mL/min   GFR calc Af Amer 81 (L) >90 mL/min    Comment: (NOTE) The eGFR has been calculated using the CKD EPI equation. This calculation has not been validated in all clinical situations. eGFR's persistently <90 mL/min signify possible Chronic Kidney Disease.    Anion gap 13 5 - 15  I-Stat Troponin, ED (not at [facility])     Status: None   Collection Time: 02/12/14 10:02 AM  Result Value   Ref Range   Troponin i, poc 0.00 0.00 - 0.08 ng/mL   Comment 3            Comment: Due to the release kinetics of cTnI, a negative result within the first hours of the onset of symptoms does not rule out myocardial infarction with certainty. If myocardial infarction is still suspected, repeat the test at appropriate intervals.   I-Stat Chem 8, ED     Status: Abnormal   Collection Time: 02/12/14 10:04 AM  Result Value Ref Range   Sodium 139 137 - 147 mEq/L   Potassium 3.8 3.7 - 5.3 mEq/L   Chloride 109 96 - 112 mEq/L   BUN 13 6 - 23 mg/dL   Creatinine, Ser 1.10 0.50 - 1.35 mg/dL   Glucose, Bld 144 (H) 70 - 99 mg/dL   Calcium, Ion  1.09 (L) 1.13 - 1.30 mmol/L   TCO2 22 0 - 100 mmol/L   Hemoglobin 13.3 13.0 - 17.0 g/dL   HCT 39.0 39.0 - 52.0 %   Ct Head Wo Contrast  02/12/2014   CLINICAL DATA:  Left-sided weakness.  Slurred speech.  EXAM: CT HEAD WITHOUT CONTRAST  TECHNIQUE: Contiguous axial images were obtained from the base of the skull through the vertex without contrast.  COMPARISON:  08/15/2010  FINDINGS: No evidence for acute hemorrhage, mass lesion, midline shift, hydrocephalus or large infarct. Diffuse mucosal thickening in the maxillary sinuses suggesting chronic changes. Postsurgical changes in the maxillary sinuses bilaterally. Diffuse mucosal disease in the ethmoid air cells. No acute bone abnormality. Subtle low density in the right frontal white matter appears unchanged.  IMPRESSION: No acute intracranial abnormality.  Paranasal sinus disease.   Electronically Signed   By: Adam  Henn M.D.   On: 02/12/2014 10:13     Assessment: 63 y.o. male with multiple risk factors for stroke brought in with a neurological picture consistent with a large right MCA distribution ischemic stroke. NIHSS 17, CT brain without acute abnormality. He can be considered as a wake up stroke, and thus will pursue CTP/CTA brain to determine whether or not there is an area of mismatch with salvageable brain tissue.    Stroke Risk Factors -  HTN,hyperlipidemia, DM, CAD, cardiomyopathy, and obesity  Plan: 1. HgbA1c, fasting lipid panel 2. MRI brain 3. Echocardiogram 4. Carotid dopplers 5. Prophylactic therapy-aspirin after passing swallowing evaluation 6. Risk factor modification 7. Telemetry monitoring 8. Frequent neuro checks 9. PT/OT SLP  Osvaldo Camilo,MD [facility] [phone]  02/12/2014, 11:21 AM  Addendum: unable to obtain IV access to perform CTA/CTP or MRI/MR perfusion and determine if there is still salvable brain tissue . It was not logistically feasible for the IV team to see patient promptly because  they were busy at [facility]. PIC line insertion was requested. The information regarding the presence of a mismatch in wake up stroke is a still a requirement before undergoing endovascular intervention and this was not available for the reasons stated above.    Osvaldo Camilo, MD  

## 2014-02-12 NOTE — Progress Notes (Signed)
Peripherally Inserted Central Catheter/Midline Placement  The IV Nurse has discussed with the patient and/or persons authorized to consent for the patient, the purpose of this procedure and the potential benefits and risks involved with this procedure.  The benefits include less needle sticks, lab draws from the catheter and patient may be discharged home with the catheter.  Risks include, but not limited to, infection, bleeding, blood clot (thrombus formation), and puncture of an artery; nerve damage and irregular heat beat.  Alternatives to this procedure were also discussed.  Pt verbalizes understanding as does wife.  Pt requested for wife to sign consent due to pt is left handed and has L sided weakness.  PICC/Midline Placement Documentation  PICC / Midline Single Lumen 57/01/77 PICC Right Basilic 44 cm 0 cm (Active)  Indication for Insertion or Continuance of Line Limited venous access - need for IV therapy >5 days (PICC only);Poor Vasculature-patient has had multiple peripheral attempts or PIVs lasting less than 24 hours 02/12/2014  6:08 PM  Exposed Catheter (cm) 0 cm 02/12/2014  6:08 PM  Site Assessment Clean;Intact;Dry 02/12/2014  6:08 PM  Line Status Flushed;Saline locked;Blood return noted 02/12/2014  6:08 PM  Dressing Type Transparent 02/12/2014  6:08 PM  Dressing Status Clean;Dry;Intact;Antimicrobial disc in place 02/12/2014  6:08 PM  Line Care Connections checked and tightened 02/12/2014  6:08 PM  Line Adjustment (NICU/IV Team Only) No 02/12/2014  6:08 PM  Dressing Intervention New dressing 02/12/2014  6:08 PM  Dressing Change Due 02/19/14 02/12/2014  6:08 PM       Rolena Infante 02/12/2014, 6:09 PM

## 2014-02-12 NOTE — Progress Notes (Signed)
Report recd from [place], [place] in ED. Will monitor for pt's arrival to unit   Angeline Slim I 02/12/2014 11:34 AM

## 2014-02-12 NOTE — Progress Notes (Signed)
  Patient has new PIV @ 1155, Erika bringing back to CT @ 1205

## 2014-02-12 NOTE — Progress Notes (Signed)
Rt note: Pt has home CPAP at bedside. CPAP setup and inspected H2O filled. Per pt he can place himself on when ready.

## 2014-02-12 NOTE — ED Provider Notes (Signed)
CSN: [id number]     Arrival date & time 02/12/14  4163 History   First MD Initiated Contact with Patient 02/12/14 0930     Chief Complaint  Patient presents with  . Code Stroke     (Consider location/radiation/quality/duration/timing/severity/associated sxs/prior Treatment) The history is provided by the patient. No language interpreter was used.  Gregory Wiley is a 63 y/o M with PMHx of MI with coronary angioplasty with stent x 3 2009/2012/2013, DM, HTN, HLD, CAD, obesity, OSA presenting to the ED with stroke like symptoms - last seen normal at 12:00AM last night when patient went to bed. Wife that accompanies patient reported that when patient woke up this morning at 8:30AM patient reported that he was not feeling well - when she looked she noticed that he was having a droop to the left side of the face and weakness to the left arm. Patient was experiencing back pain and his left legs gave out on him and he fell - patient reported that he did not hit his head. Wife reported that as the morning progressed so did the symptoms - reported that at first patient was able to lift up his left arm, but stated that after a short period of time patient was not able to due so secondary to increased weakness. Wife reported that patient only has history of MI, stated that he has never had a stroke before. Patient on blood thinner - ASA and Plavix. Denied head injury, travel, history of stroke, numbness, tingling, chest pain, shortness of breath, difficulty breathing. PCP Dr. Rex Kras  Past Medical History  Diagnosis Date  . Coronary artery disease     stent, then CABG 07/20/10  . MI (myocardial infarction)     NSTEMI- last cath 06/2011-Stent to LCX-DES, last nuc 07/30/11 low risk  . Diabetes mellitus   . Hypertension   . Hyperlipidemia   . Obesity (BMI 30-39.9)   . OSA on CPAP     since July 2013  . H/O cardiomyopathy     ischemic, now last echo 07/30/11, EF 38%W  . Diverticulosis   . Internal hemorrhoids    . Tubular adenoma of colon    Past Surgical History  Procedure Laterality Date  . Coronary artery bypass graft  07/20/2010     LIMA-LAD; VG-ACUTE MARG of RCA; Seq VG-distal RCA & then pda  . Coronary angioplasty with stent placement  07/01/2011    DES-Resolute to native LCX  . Coronary angioplasty with stent placement  12/01/2007    COMPLEX 5 LESION PCI INCLUDING CUTTING BALLOON AND 4 CYPHER DESs  . Hernia repair  2006  . Knee surgery Right   . Hand surgery      to take glass out  . Colonoscopy N/A 02/10/2013    Procedure: COLONOSCOPY;  Surgeon: Ladene Artist, MD;  Location: [facility];  Service: Endoscopy;  Laterality: N/A;   Family History  Problem Relation Age of Onset  . Diabetes type II Mother   . Coronary artery disease    . Colon cancer Neg Hx   . CAD Father 37    CABG, PCI, died at 68  . Heart attack Father 70  . Hypertension Brother   . Diabetes Brother   . Hyperlipidemia Brother   . Brain cancer Maternal Grandmother   . COPD Paternal Grandfather    History  Substance Use Topics  . Smoking status: Former Research scientist (life sciences)  . Smokeless tobacco: Never Used  . Alcohol Use: Yes  Comment: 1-2 times per year    Review of Systems  Respiratory: Negative for cough, chest tightness and shortness of breath.   Cardiovascular: Negative for chest pain.  Musculoskeletal: Positive for neck pain.  Neurological: Positive for weakness (left sided ). Negative for dizziness, numbness and headaches.      Allergies  Diphenhydramine hcl  Home Medications   Prior to Admission medications   Medication Sig Start Date End Date Taking? Authorizing Provider  aspirin 81 MG tablet Take 81 mg by mouth daily.    Historical Provider, MD  carvedilol (COREG) 6.25 MG tablet Take 1 tablet (6.25 mg total) by mouth 2 (two) times daily. 08/02/13   [name], MD  clopidogrel (PLAVIX) 75 MG tablet Take 75 mg by mouth daily.    Historical Provider, MD  furosemide (LASIX) 40 MG tablet Take 20 mg  by mouth daily as needed.     Historical Provider, MD  isosorbide mononitrate (IMDUR) 30 MG 24 hr tablet Take 0.5 tablets (15 mg total) by mouth daily. 10/07/12   [name], MD  nitroGLYCERIN (NITROSTAT) 0.4 MG SL tablet Place 0.4 mg under the tongue every 5 (five) minutes as needed. For chest pain    Historical Provider, MD  omeprazole (PRILOSEC) 20 MG capsule Take 20 mg by mouth 2 (two) times daily.    Historical Provider, MD  ranolazine (RANEXA) 500 MG 12 hr tablet Take 1 tablet (500 mg total) by mouth 2 (two) times daily. 10/07/12   [name], MD  simvastatin (ZOCOR) 40 MG tablet Take 40 mg by mouth every evening.    Historical Provider, MD  Vitamin D, Ergocalciferol, (DRISDOL) 50000 UNITS CAPS capsule Take 50,000 Units by mouth every 7 (seven) days.    Historical Provider, MD   BP 126/63 mmHg  Pulse 60  Temp(Src) 98.6 F (37 C) (Oral)  Resp 17  Ht 6' (1.829 m)  Wt 350 lb (158.759 kg)  BMI 47.46 kg/m2  SpO2 95% Physical Exam  Constitutional: He is oriented to person, place, and time. He appears well-developed and well-nourished. No distress.  HENT:  Head: Normocephalic and atraumatic.  Mouth/Throat: Oropharynx is clear and moist. No oropharyngeal exudate.  Diminished gag reflex  Eyes: Conjunctivae and EOM are normal. Pupils are equal, round, and reactive to light. Right eye exhibits no discharge. Left eye exhibits no discharge.  Rightward gaze Peripheral vision limited to the left side  Neck: Normal range of motion. Neck supple. No tracheal deviation present.  Cardiovascular: Normal rate, regular rhythm and normal heart sounds.  Exam reveals no friction rub.   No murmur heard. Pulmonary/Chest: Effort normal and breath sounds normal. No respiratory distress. He has no wheezes. He has no rales.  Patient is able to speak in full sentences without difficulty Negative use of accessory muscles Negative stridor  Lymphadenopathy:    He has no cervical adenopathy.  Neurological:  He is alert and oriented to person, place, and time.  Facial droop to the left side Rightward gaze Slurred speech Diminished grip strength to the left upper extremity-patient cannot produce a fist nor can he move the left upper extremity Decreased strength to the left lower extremity Patient follows commands Patient response to questions appropriately   Skin: Skin is warm and dry. No rash noted. He is not diaphoretic. No erythema.  Psychiatric: He has a normal mood and affect. His behavior is normal. Thought content normal.  Nursing note and vitals reviewed.   ED Course  Procedures (including critical  care time)  Results for orders placed or performed during the hospital encounter of 02/12/14  Ethanol  Result Value Ref Range   Alcohol, Ethyl (B) <11 0 - 11 mg/dL  Protime-INR  Result Value Ref Range   Prothrombin Time 13.7 11.6 - 15.2 seconds   INR 1.04 0.00 - 1.49  APTT  Result Value Ref Range   aPTT 31 24 - 37 seconds  CBC  Result Value Ref Range   WBC 6.7 4.0 - 10.5 K/uL   RBC 4.33 4.22 - 5.81 MIL/uL   Hemoglobin 12.0 (L) 13.0 - 17.0 g/dL   HCT 37.5 (L) 39.0 - 52.0 %   MCV 86.6 78.0 - 100.0 fL   MCH 27.7 26.0 - 34.0 pg   MCHC 32.0 30.0 - 36.0 g/dL   RDW 13.8 11.5 - 15.5 %   Platelets 197 150 - 400 K/uL  Differential  Result Value Ref Range   Neutrophils Relative % 52 43 - 77 %   Neutro Abs 3.5 1.7 - 7.7 K/uL   Lymphocytes Relative 34 12 - 46 %   Lymphs Abs 2.3 0.7 - 4.0 K/uL   Monocytes Relative 8 3 - 12 %   Monocytes Absolute 0.5 0.1 - 1.0 K/uL   Eosinophils Relative 5 0 - 5 %   Eosinophils Absolute 0.4 0.0 - 0.7 K/uL   Basophils Relative 1 0 - 1 %   Basophils Absolute 0.1 0.0 - 0.1 K/uL  Comprehensive metabolic panel  Result Value Ref Range   Sodium 140 137 - 147 mEq/L   Potassium 4.1 3.7 - 5.3 mEq/L   Chloride 106 96 - 112 mEq/L   CO2 21 19 - 32 mEq/L   Glucose, Bld 137 (H) 70 - 99 mg/dL   BUN 13 6 - 23 mg/dL   Creatinine, Ser 1.09 0.50 - 1.35 mg/dL    Calcium 8.5 8.4 - 10.5 mg/dL   Total Protein 7.1 6.0 - 8.3 g/dL   Albumin 3.2 (L) 3.5 - 5.2 g/dL   AST 15 0 - 37 U/L   ALT 11 0 - 53 U/L   Alkaline Phosphatase 78 39 - 117 U/L   Total Bilirubin 0.2 (L) 0.3 - 1.2 mg/dL   GFR calc non Af Amer 70 (L) >90 mL/min   GFR calc Af Amer 81 (L) >90 mL/min   Anion gap 13 5 - 15  I-Stat Chem 8, ED  Result Value Ref Range   Sodium 139 137 - 147 mEq/L   Potassium 3.8 3.7 - 5.3 mEq/L   Chloride 109 96 - 112 mEq/L   BUN 13 6 - 23 mg/dL   Creatinine, Ser 1.10 0.50 - 1.35 mg/dL   Glucose, Bld 144 (H) 70 - 99 mg/dL   Calcium, Ion 1.09 (L) 1.13 - 1.30 mmol/L   TCO2 22 0 - 100 mmol/L   Hemoglobin 13.3 13.0 - 17.0 g/dL   HCT 39.0 39.0 - 52.0 %  I-Stat Troponin, ED (not at [facility])  Result Value Ref Range   Troponin i, poc 0.00 0.00 - 0.08 ng/mL   Comment 3            Labs Review Labs Reviewed  CBC - Abnormal; Notable for the following:    Hemoglobin 12.0 (*)    HCT 37.5 (*)    All other components within normal limits  COMPREHENSIVE METABOLIC PANEL - Abnormal; Notable for the following:    Glucose, Bld 137 (*)    Albumin 3.2 (*)    Total  Bilirubin 0.2 (*)    GFR calc non Af Amer 70 (*)    GFR calc Af Amer 81 (*)    All other components within normal limits  I-STAT CHEM 8, ED - Abnormal; Notable for the following:    Glucose, Bld 144 (*)    Calcium, Ion 1.09 (*)    All other components within normal limits  ETHANOL  PROTIME-INR  APTT  DIFFERENTIAL  URINE RAPID DRUG SCREEN (HOSP PERFORMED)  URINALYSIS, ROUTINE W REFLEX MICROSCOPIC  I-STAT TROPOININ, ED  I-STAT CHEM 8, ED  I-STAT TROPOININ, ED  I-STAT TROPOININ, ED    Imaging Review Ct Head Wo Contrast  02/12/2014   CLINICAL DATA:  Left-sided weakness.  Slurred speech.  EXAM: CT HEAD WITHOUT CONTRAST  TECHNIQUE: Contiguous axial images were obtained from the base of the skull through the vertex without contrast.  COMPARISON:  08/15/2010  FINDINGS: No evidence for acute hemorrhage, mass  lesion, midline shift, hydrocephalus or large infarct. Diffuse mucosal thickening in the maxillary sinuses suggesting chronic changes. Postsurgical changes in the maxillary sinuses bilaterally. Diffuse mucosal disease in the ethmoid air cells. No acute bone abnormality. Subtle low density in the right frontal white matter appears unchanged.  IMPRESSION: No acute intracranial abnormality.  Paranasal sinus disease.   Electronically Signed   By: Markus Daft M.D.   On: 02/12/2014 10:13     EKG Interpretation   Date/Time:  Saturday February 12 2014 09:37:32 EST Ventricular Rate:  60 PR Interval:  171 QRS Duration: 100 QT Interval:  457 QTC Calculation: 457 R Axis:   -30 Text Interpretation:  Sinus rhythm Left axis deviation Low voltage,  precordial leads Borderline repolarization abnormality agree. no change  Confirmed by Johnney Killian, MD, Jeannie Done [phone]) on 02/12/2014 10:45:29 AM       10:33 AM Spoke with Dr. Aram Beecham, neurologist. Patient seen and assessed by neurology. As per physician reported that patient is out of window. Reported that patient will need to get a CT perfusion study to identify any salvaging of the tissues. Recommended patient to be admitted to internal medicine services.  11:06 AM Spoke with Dr. Sherral Hammers, internal medicine resident-discussed case, labs, imaging in ED course in great detail. Discussed recommendations from neurology. Patient to be admitted to telemetry floor for stroke work up.   MDM   Final diagnoses:  Cerebral infarction due to unspecified mechanism    Medications  sodium chloride 0.9 % injection (not administered)  iohexol (OMNIPAQUE) 350 MG/ML injection 50 mL (not administered)    Filed Vitals:   02/12/14 0936 02/12/14 0942 02/12/14 1007  BP: 126/63    Pulse: 60    Temp: 98.6 F (37 C)    TempSrc: Oral    Resp: 17    Height: 6' (1.829 m)    Weight: 350 lb (158.759 kg)    SpO2: 95% 95% 95%   I-STAT troponin negative elevation. CBC negative  elevated white blood cell count. Hemoglobin 12.0, hematocrit 37.5. CMP noted glucose of 137-negative elevated anion gap, 13.0 mEq per liter. APTT, PT/INR within normal limits. Ethanol negative elevation. CT head without contrast negative for acute intracranial abnormalities. Patient presenting to the ED with strokelike symptoms with rightward gaze, left facial droop and weakness to the left upper and left lower extremities. Last seen normal was when patient went to bed at 12:00 AM midnight, woke up with facial droop to the left side of the face. Symptoms worsening since 8:30 AM when patient woke up. Patient seen and assessed by  neurology, as per Dr. Aram Beecham reported the patient will need to get a CT perfusion study to identify whether or not there is tissue salvage. Reported the patient does not meet criteria for TPA. As per physician recommended patient to be admitted to internal medicine services. This provider spoke with internal medicine teaching services, patient to be in its telemetry floor for further workup or stroke. Patient stable for transfer. Airway intact.   Jamse Mead, PA-C 02/12/14 1115  Charlesetta Shanks, MD 02/15/14 [phone]

## 2014-02-12 NOTE — Progress Notes (Signed)
Patient is FOR CT P/CTA head STAT- On arrival, existing IV had a lot of resistance to flush- Repositioned arm and tubing, still resistant- reposition again, better blood return and flush- Test injected and IV leaked at insertion site under tegaderm.  Unable to use 20g in rt ac for CTA/CTP due to malfunctioning IV

## 2014-02-12 NOTE — ED Provider Notes (Signed)
The patient was went to bed normal at midnight. He awoke at 8:30 this morning with a facial droop. On the left side. He reports he tried to get out of bed and collapsed to the ground. He reports his legs were weak and went out from under him. The patient denies that he had any head injury in association with this. His wife reports that at that time he was able to elevate his left arm and looked to the left. At this time now he has a forced right gaze deviation and very limited ability to elevate his left arm. That symptom has developed and progressed within the past 45 minutes. The patient does endorse generalized headache as well as nausea. The patient does take a daily Plavix and aspirin. He has no prior stroke history. He has prior cardiac history.  On examination the patient is awake and alert. He has a right forced gaze deviation. There is left facial paralysis with mouth droop. And left hemianopsia.. The patient is able to perform commands and is alert to place person and time. He has minimal ability to elevate his left arm off of the stretcher. He is able to elevate briefly the left lower extremity off of the bed with great effort. Patient is able to perform strength testing on the right with normal strength. The patient has a left neglect. He is unaware of being touched on the left side of his body. He cannot distinguish dual touch. The patient does have a diminished gag reflex.  At this point the patient is 9 hours and 45 minutes into symptoms based on going to bed well at midnight. There is however significant worsening identified within the last 45-30 minutes. Code stroke was called for stuttering and/advancing CVA.   CRITICAL CARE Performed by: Charlesetta Shanks   Total critical care time: 30 minutes  Critical care time was exclusive of separately billable procedures and treating other patients.  Critical care was necessary to treat or prevent imminent or life-threatening  deterioration.  Critical care was time spent personally by me on the following activities: development of treatment plan with patient and/or surrogate as well as nursing, discussions with consultants, evaluation of patient's response to treatment, examination of patient, obtaining history from patient or surrogate, ordering and performing treatments and interventions, ordering and review of laboratory studies, ordering and review of radiographic studies, pulse oximetry and re-evaluation of patient's condition.  Charlesetta Shanks, MD 02/15/14 1010

## 2014-02-12 NOTE — Progress Notes (Signed)
Pt arrived to unit from ED with ED nurse, Doroteo Bradford, per stretcher. No acute distress noted. Assessment performed as charted.  Angeline Slim I 02/12/2014 1:12 PM

## 2014-02-12 NOTE — Progress Notes (Signed)
18 g rt upper arm infiltrated 40 ml saline with test injection via power injector

## 2014-02-12 NOTE — Significant Event (Signed)
Rapid Response Event Note  Overview:  Called to see patient for Code Stroke    Initial Focused Assessment: Upon arrival patient is alert and responsive to verbal stimulus. Fixed right sided gaze. Did NIHSS with score of 17 (see flow sheet). MD at bedside. Story: patient last seen normal at midnight. Per family awoke this morning, felt back pain, stepped out of bed and fell forward onto forearms. Left sided weakness.  Family put in private vehicle and arrived at ED approx 9AM. It took some time to get him out of the car.  Symptoms worsened in ED to current state.  Interventions: Assisted with assessment using NIHSS. Accompanied to CT for CTA; unfortunately the right AC IV would not take the force of contrast injections. Two IV attempts by CT staff were unsuccessful. Returned to ED room for better monitoring while getting a better line. CT staff called IV Team. ED nurse attempting to place IV as soon as patient arrived back at the ED room.  Of note: patient has experienced some snoring type respirations; sats have remained 99-100% on continuous monitoring. Some bradycardia. He has always responded appropriately to verbal stim.  Event Summary:   at      at          Baron Hamper

## 2014-02-12 NOTE — Progress Notes (Signed)
Pt's wife requested to know why pt's CTA with contrast was cancelled since it was the reason for the PICC Line insertion, Dr Raelene Bott (on call) was paged and notified, said the reason might be due to pt's MRI result which already confirmed stroke, same reported back to pt's wife, pt quiet in bed,condition remain stable, will however continue to monitor. Obasogie-Asidi, Joniel Graumann Efe

## 2014-02-13 ENCOUNTER — Inpatient Hospital Stay (HOSPITAL_COMMUNITY): Payer: 59

## 2014-02-13 DIAGNOSIS — I639 Cerebral infarction, unspecified: Secondary | ICD-10-CM

## 2014-02-13 DIAGNOSIS — I63411 Cerebral infarction due to embolism of right middle cerebral artery: Secondary | ICD-10-CM

## 2014-02-13 DIAGNOSIS — I517 Cardiomegaly: Secondary | ICD-10-CM

## 2014-02-13 DIAGNOSIS — K219 Gastro-esophageal reflux disease without esophagitis: Secondary | ICD-10-CM

## 2014-02-13 LAB — LIPID PANEL
Cholesterol: 181 mg/dL (ref 0–200)
HDL: 40 mg/dL (ref >39)
LDL Cholesterol: 120 mg/dL — ABNORMAL HIGH (ref 0–99)
Total CHOL/HDL Ratio: 4.5 RATIO
Triglycerides: 105 mg/dL (ref <150)
VLDL: 21 mg/dL (ref 0–40)

## 2014-02-13 LAB — HEMOGLOBIN A1C
Hgb A1c MFr Bld: 5.6 % (ref <5.7)
Mean Plasma Glucose: 114 mg/dL (ref <117)

## 2014-02-13 MED ORDER — OXYCODONE HCL 5 MG PO TABS
5.0000 mg | ORAL_TABLET | Freq: Once | ORAL | Status: AC
  Administered 2014-02-13: 5 mg via ORAL
  Filled 2014-02-13: qty 1

## 2014-02-13 MED ORDER — ENOXAPARIN SODIUM 30 MG/0.3ML ~~LOC~~ SOLN
30.0000 mg | Freq: Two times a day (BID) | SUBCUTANEOUS | Status: DC
  Administered 2014-02-13 – 2014-02-14 (×4): 30 mg via SUBCUTANEOUS
  Filled 2014-02-13 (×5): qty 0.3

## 2014-02-13 MED ORDER — ATORVASTATIN CALCIUM 80 MG PO TABS
80.0000 mg | ORAL_TABLET | Freq: Every day | ORAL | Status: DC
  Administered 2014-02-13 – 2014-02-15 (×3): 80 mg via ORAL
  Filled 2014-02-13 (×3): qty 1

## 2014-02-13 MED ORDER — SIMVASTATIN 40 MG PO TABS: 40.0000 mg | ORAL_TABLET | Freq: Every evening | ORAL | Status: DC

## 2014-02-13 MED ORDER — GADOBENATE DIMEGLUMINE 529 MG/ML IV SOLN
20.0000 mL | Freq: Once | INTRAVENOUS | Status: AC | PRN
  Administered 2014-02-13: 20 mL via INTRAVENOUS

## 2014-02-13 MED ORDER — PANTOPRAZOLE SODIUM 40 MG PO TBEC
40.0000 mg | DELAYED_RELEASE_TABLET | Freq: Every day | ORAL | Status: DC
  Administered 2014-02-13 – 2014-02-15 (×3): 40 mg via ORAL
  Filled 2014-02-13 (×3): qty 1

## 2014-02-13 MED ORDER — KETOROLAC TROMETHAMINE 30 MG/ML IJ SOLN: 30.0000 mg | Freq: Once | INTRAMUSCULAR | Status: DC

## 2014-02-13 NOTE — Progress Notes (Signed)
  Echocardiogram 2D Echocardiogram has been performed.  Gregory Wiley 02/13/2014, 9:44 AM

## 2014-02-13 NOTE — Progress Notes (Signed)
PT Cancellation Note  Patient Details Name: Gregory Wiley MRN: [id number] DOB: 1950/12/01   Cancelled Treatment:    Reason Eval/Treat Not Completed: Medical issues which prohibited therapy.  Per MD orders, patient remains on bedrest.  MD:  Please write activity orders when appropriate for patient.  Will initiate PT evaluation at that time.  Thank you.   Gregory Wiley 02/13/2014, 3:10 PM Gregory Wiley. Gregory Wiley, [place] Pager [phone]

## 2014-02-13 NOTE — Evaluation (Signed)
Clinical/Bedside Swallow Evaluation Patient Details  Name: Gregory Wiley MRN: [id number] Date of Birth: 08/28/1950  Today's Date: 02/13/2014 Time: 1001-1017 SLP Time Calculation (min) (ACUTE ONLY): 16 min  Past Medical History:  Past Medical History  Diagnosis Date  . Coronary artery disease     stent, then CABG 07/20/10  . MI (myocardial infarction)     NSTEMI- last cath 06/2011-Stent to LCX-DES, last nuc 07/30/11 low risk  . Diabetes mellitus   . Hypertension   . Hyperlipidemia   . Obesity (BMI 30-39.9)   . OSA on CPAP     since July 2013  . H/O cardiomyopathy     ischemic, now last echo 07/30/11, EF 38%W  . Diverticulosis   . Internal hemorrhoids   . Tubular adenoma of colon    Past Surgical History:  Past Surgical History  Procedure Laterality Date  . Coronary artery bypass graft  07/20/2010     LIMA-LAD; VG-ACUTE MARG of RCA; Seq VG-distal RCA & then pda  . Coronary angioplasty with stent placement  07/01/2011    DES-Resolute to native LCX  . Coronary angioplasty with stent placement  12/01/2007    COMPLEX 5 LESION PCI INCLUDING CUTTING BALLOON AND 4 CYPHER DESs  . Hernia repair  2006  . Knee surgery Right   . Hand surgery      to take glass out  . Colonoscopy N/A 02/10/2013    Procedure: COLONOSCOPY;  Surgeon: Ladene Artist, MD;  Location: [facility];  Service: Endoscopy;  Laterality: N/A;  . Retinal detachment surgery     HPI:  Gregory Wiley is a 63 y/o M with PMHx of MI with coronary angioplasty with stent x 3 2009/2012/2013, DM, HTN, HLD, CAD, obesity, OSA who presented to the ED with left facial droop and left arm weakness. MRI revealed right MCA infarct.   Assessment / Plan / Recommendation Clinical Impression  Pt has decreased strength, ROM, and sensation on his left side, resulting in anterior loss, prolonged transit, and left buccal pocketing. With Min cues from SLP, pt utilized a lingual sweep to clear residuals from Dys 2 textures. Cup and straw sips of thin  liquids result in immediate coughing, and with right-sided placement of straw the patient still produces multiple swallows per bolus. No overt signs of aspiration are observed with SLP intervention for medified liquids as well as right-sided placement of straw. Recommend Dys 2 textures and nectar thick liquids via straw with full supervision for oral clearance and right-sided placement.    Aspiration Risk  Moderate    Diet Recommendation Dysphagia 2 (Fine chop);Nectar-thick liquid   Liquid Administration via: Straw (placed on right) Medication Administration: Crushed with puree Supervision: Patient able to self feed;Full supervision/cueing for compensatory strategies Compensations: Slow rate;Small sips/bites;Check for pocketing;Check for anterior loss Postural Changes and/or Swallow Maneuvers: Seated upright 90 degrees    Other  Recommendations Oral Care Recommendations: Oral care BID Other Recommendations: Order thickener from pharmacy;Prohibited food (jello, ice cream, thin soups);Remove water pitcher   Follow Up Recommendations  Inpatient Rehab;24 hour supervision/assistance    Frequency and Duration min 2x/week  2 weeks   Pertinent Vitals/Pain n/a    SLP Swallow Goals     Swallow Study Prior Functional Status       General Date of Onset: 02/12/14 HPI: Gregory Wiley is a 63 y/o M with PMHx of MI with coronary angioplasty with stent x 3 2009/2012/2013, DM, HTN, HLD, CAD, obesity, OSA who presented to the ED with left facial  droop and left arm weakness. MRI revealed right MCA infarct. Type of Study: Bedside swallow evaluation Previous Swallow Assessment: none in chart Diet Prior to this Study: NPO Temperature Spikes Noted: Yes (low grade) Respiratory Status: Room air History of Recent Intubation: No Behavior/Cognition: Alert;Cooperative;Pleasant mood;Requires cueing Oral Cavity - Dentition: Adequate natural dentition (lump on roof of mouth) Self-Feeding Abilities: Able to feed  self Patient Positioning: Upright in bed Baseline Vocal Quality: Clear Volitional Cough: Strong Volitional Swallow: Able to elicit    Oral/Motor/Sensory Function Overall Oral Motor/Sensory Function: Impaired Labial ROM: Reduced left Labial Symmetry: Abnormal symmetry left Labial Strength: Reduced Labial Sensation: Reduced Lingual ROM: Within Functional Limits Lingual Symmetry: Within Functional Limits Lingual Strength: Within Functional Limits Facial ROM: Reduced left Facial Symmetry: Left droop Facial Strength: Reduced Facial Sensation: Reduced Mandible: Within Functional Limits   Ice Chips Ice chips: Not tested   Thin Liquid Thin Liquid: Impaired Presentation: Cup;Self Fed;Straw Oral Phase Impairments: Reduced labial seal;Poor awareness of bolus Oral Phase Functional Implications: Left anterior spillage Pharyngeal  Phase Impairments: Suspected delayed Swallow;Cough - Immediate;Multiple swallows    Nectar Thick Nectar Thick Liquid: Impaired Presentation: Self Fed;Straw Oral Phase Impairments: Reduced labial seal Pharyngeal Phase Impairments: Suspected delayed Swallow   Honey Thick Honey Thick Liquid: Not tested   Puree Puree: Impaired Presentation: Self Fed;Spoon Oral Phase Impairments: Reduced labial seal;Poor awareness of bolus Oral Phase Functional Implications: Left anterior spillage;Prolonged oral transit   Solid   GO    Solid: Impaired Presentation: Self Fed;Spoon Oral Phase Impairments: Reduced labial seal;Poor awareness of bolus;Impaired anterior to posterior transit Oral Phase Functional Implications: Left anterior spillage;Left lateral sulci pocketing;Oral residue       [name], M.A. CCC-SLP [phone]  [name] 02/13/2014,10:43 AM

## 2014-02-13 NOTE — Progress Notes (Signed)
STROKE TEAM PROGRESS NOTE   HISTORY Gregory Wiley is a 63 y.o. male, left handed, with a past medical history significant for HTN,hyperlipidemia, DM, CAD s/p CABG, MI, ischemic cardiomyopathy, OSA on CPAP, and obesity, brought in as a code stroke by EMS due to acute onset of left hemiparesis, dysarthria, and left facial weakness. Wife is at the bedside and said that he went to bed at midnight and was well, woke up around 8:30 complaining of low back pain, had unsteady gait and fell to the floor. She said that she noticed that he was weak in the left side and had droopiness of the left face, and also had slurred speech. He did not improved and she called EMS. According to wife, these symptoms are worsening. Initial NIHSS 17.  CT brain showed no intracranial abnormality. At this time he is awake and follows simple commands but with sluggish response  Date last known well: 02/12/14 Time last known well: midnight tPA Given: no, patient out of the window for thrombolysis. NIHSS: 17     SUBJECTIVE (INTERVAL HISTORY) Wife and daughter at bedside. They think patient is stable or minimally improving, he can see better on the left side now. However he still has significant left-sided weakness and facial droop. Patient reports that he "got up this morning and fell flat on my face" when asked what happened. Wife states that patient was off of his asa and plavix due to bleeding ulcer but that he is not compliant with his medications anyway.    OBJECTIVE Temp:  [97.3 F (36.3 C)-99.5 F (37.5 C)] 98.1 F (36.7 C) (11/29 0952) Pulse Rate:  [59-87] 66 (11/29 0952) Cardiac Rhythm:  [-] Normal sinus rhythm (11/29 0800) Resp:  [18-20] 18 (11/29 0952) BP: (106-143)/(57-78) 106/58 mmHg (11/29 0952) SpO2:  [97 %-100 %] 97 % (11/29 0952)  No results for input(s): GLUCAP in the last 168 hours.  Recent Labs Lab 02/12/14 0950 02/12/14 1004  NA 140 139  K 4.1 3.8  CL 106 109  CO2 21  --   GLUCOSE 137*  144*  BUN 13 13  CREATININE 1.09 1.10  CALCIUM 8.5  --     Recent Labs Lab 02/12/14 0950  AST 15  ALT 11  ALKPHOS 78  BILITOT 0.2*  PROT 7.1  ALBUMIN 3.2*    Recent Labs Lab 02/12/14 0950 02/12/14 1004  WBC 6.7  --   NEUTROABS 3.5  --   HGB 12.0* 13.3  HCT 37.5* 39.0  MCV 86.6  --   PLT 197  --    No results for input(s): CKTOTAL, CKMB, CKMBINDEX, TROPONINI in the last 168 hours.  Recent Labs  02/12/14 0950  LABPROT 13.7  INR 1.04    Recent Labs  02/12/14 1900  COLORURINE YELLOW  LABSPEC 1.015  PHURINE 8.0  GLUCOSEU NEGATIVE  HGBUR NEGATIVE  BILIRUBINUR NEGATIVE  KETONESUR NEGATIVE  PROTEINUR NEGATIVE  UROBILINOGEN 0.2  NITRITE NEGATIVE  LEUKOCYTESUR TRACE*       Component Value Date/Time   CHOL 181 02/13/2014 0537   TRIG 105 02/13/2014 0537   TRIG 113 01/04/2013 1206   HDL 40 02/13/2014 0537   CHOLHDL 4.5 02/13/2014 0537   VLDL 21 02/13/2014 0537   LDLCALC 120* 02/13/2014 0537   LDLCALC 129* 01/04/2013 1206   Lab Results  Component Value Date   HGBA1C 5.7* 06/30/2011      Component Value Date/Time   LABOPIA NONE DETECTED 02/12/2014 [place] DETECTED 02/12/2014 1900  LABBENZ NONE DETECTED 02/12/2014 1900   AMPHETMU NONE DETECTED 02/12/2014 1900   THCU NONE DETECTED 02/12/2014 1900   LABBARB NONE DETECTED 02/12/2014 1900     Recent Labs Lab 02/12/14 0950  ETH <11    Ct Head Wo Contrast 02/12/2014    No acute intracranial abnormality.  Paranasal sinus disease.    [name] 02/12/2014    Acute infarct right MCA territory involving the right temporal lobe, insula, and basal ganglia. No hemorrhage or midline shift  Abrupt occlusion of the right M1 segment may represent embolus.     PHYSICAL EXAM  PHYSICAL EXAM Physical exam: Exam: Gen: NAD Eyes: anicteric sclerae, moist conjunctivae                    CV: no MRG Mental Status: Alert, follows commands, oriented to name, month, date,  location  Neuro: Detailed Neurologic Exam  Speech:    Dysarthria due to left facial weakness  Cranial Nerves:    The pupils are equal, round, and reactive to light. Right gaze preference but EOMI. Fundi not visualized, VF appear full,  left facial weakness, decreased sensation left face  Motor Observation:    no involuntary movements noted. Tone appears normal.    Strength:   Dense left hemiparesis     Sensation:    Left hemisensory loss  Plantars left upgoing.   Gait: Unable to test     Cortical Sensory Modalities:    double simultaneous extinction. Right gaze preference improved.      ASSESSMENT/PLAN [name] is a 63 y.o. male with history of hypertension, hyperlipidemia, diabetes mellitus, coronary artery disease with previous coronary artery bypass graft surgery and MI, ischemic cardiomyopathy, obstructive sleep apnea on C Pap, and obesity presenting with left hemiparesis and dysarthria. He did not receive IV t-PA due to late presentation. Medical non compliance.  Stroke:  Non-dominant - MRI - Acute infarct right MCA territory. Abrupt occlusion of the right M1 segment may represent embolus.    Resultant  Dense left hemiplegia  MRI - Acute infarct right middle cerebral artery territory.  MRA - Abrupt occlusion of the right M1 segment may represent embolus.   Carotid Doppler - pending  2D Echo - pending  LDL 120  HgbA1c pending  Lovenox for VTE prophylaxis  DIET DYS 2 with nectar thick liquids  He was on ASA and plavix before admission but per wife not compliant with his medications.             Recommend resuming ASA and Plavix.  Patient counseled to be compliant with his antithrombotic medications  Ongoing aggressive stroke risk factor management  Therapy recommendations: Pending  Disposition:  Pending  Consider cardiology consult  Hypertension  Home meds: Coreg 6.25 mg twice a day Permissive hypertension <220/120 for 24-48 hours and  then gradually normalize within 5-7 days BP goal long term normotensive  Hyperlipidemia  Home meds:  Zocor 40 mg daily, needs to be counseled on medication compliance  LDL 120, goal < 70  Continue statin at discharge  Diabetes  HgbA1c pending goal < 7.0   Other Stroke Risk Factors  Advanced age  Obesity, Body mass index is 47.46 kg/(m^2).   Coronary artery disease  Obstructive sleep apnea, on CPAP at home, not compliant  Other Active Problems  Dysphagia  Other Pertinent History Medication non compliance obstructive sleep apnea, not compliant with CPAP   Hospital day # 1  Mikey Bussing PA-C [facility] Pager [phone] 02/13/2014, 11:30 AM  Personally examined patient and images, agree with history, physical, neuro exam as stated above. Agree with assessment and plan.   Sarina Ill, MD [facility]    To contact Stroke Continuity provider, please refer to [email or web address] After hours, contact General Neurology

## 2014-02-13 NOTE — Progress Notes (Signed)
Pt has home cpap, plugged in and expresses no needs at this time. Pt states he can place himself on when ready

## 2014-02-13 NOTE — Progress Notes (Signed)
VASCULAR LAB PRELIMINARY  PRELIMINARY  PRELIMINARY  PRELIMINARY  Carotid Dopplers completed.    Preliminary report:  1-39% ICA stenosis.  Vertebral artery flow is antegrade.   Ardra Kuznicki, RVT 02/13/2014, 2:28 PM

## 2014-02-13 NOTE — Evaluation (Signed)
Speech Language Pathology Evaluation Patient Details Name: Gregory Wiley MRN: [id number] DOB: 1950/06/19 Today's Date: 02/13/2014 Time: 1017-1027 SLP Time Calculation (min) (ACUTE ONLY): 10 min  Problem List:  Patient Active Problem List   Diagnosis Date Noted  . CVA (cerebral infarction) 02/12/2014  . Benign neoplasm of colon 02/10/2013  . Special screening for malignant neoplasms, colon 02/10/2013  . OSA on CPAP 01/06/2013  . S/P angioplasty with DES to CFX 07/01/11 07/02/2011  . Sleep apnea, "cant afford C-pap" 07/02/2011  . CAD, RCA PCI '09, 10/11, CABG X 4 5/12 06/30/2011  . Hypertension, B/P has been low this admission 06/30/2011  . Hyperlipidemia 06/30/2011  . Morbid obesity 06/30/2011  . NSTEMI, 06/29/11 06/30/2011  . 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 Ischemic cardiomyopathy, EF 35-40 2D May 2012 06/30/2011  . CHF, acute, mild 06/30/2011   Past Medical History:  Past Medical History  Diagnosis Date  . Coronary artery disease     stent, then CABG 07/20/10  . MI (myocardial infarction)     NSTEMI- last cath 06/2011-Stent to LCX-DES, last nuc 07/30/11 low risk  . Diabetes mellitus   . Hypertension   . Hyperlipidemia   . Obesity (BMI 30-39.9)   . OSA on CPAP     since July 2013  . H/O cardiomyopathy     ischemic, now last echo 07/30/11, EF 38%W  . Diverticulosis   . Internal hemorrhoids   . Tubular adenoma of colon    Past Surgical History:  Past Surgical History  Procedure Laterality Date  . Coronary artery bypass graft  07/20/2010     LIMA-LAD; VG-ACUTE MARG of RCA; Seq VG-distal RCA & then pda  . Coronary angioplasty with stent placement  07/01/2011    DES-Resolute to native LCX  . Coronary angioplasty with stent placement  12/01/2007    COMPLEX 5 LESION PCI INCLUDING CUTTING BALLOON AND 4 CYPHER DESs  . Hernia repair  2006  . Knee surgery Right   . Hand surgery      to take glass out  . Colonoscopy N/A 02/10/2013    Procedure:  COLONOSCOPY;  Surgeon: Ladene Artist, MD;  Location: [facility];  Service: Endoscopy;  Laterality: N/A;  . Retinal detachment surgery     HPI:  [name] is a 63 y/o M with PMHx of MI with coronary angioplasty with stent x 3 2009/2012/2013, DM, HTN, HLD, CAD, obesity, OSA who presented to the ED with left facial droop and left arm weakness. MRI revealed right MCA infarct.   Assessment / Plan / Recommendation Clinical Impression  SLP cognitive-linguistic evaluation initiated although limited by patient fatigue. He does have a moderate dysarthria characterized primarily by imprecise articulation that reduces overall speech intelligibility. Pt has adequate intellectual awareness of phsycial and cognitive deficits, and acknowledges difficulty sustaining attention to tasks. SLP provided Max faded to Mod cues to attend to his left visual field throughout functional tasks. Pt will benefit from continued SLP services to address the above as well as continued treatment targeting differential diagnosis of higher level cognitive abilities.    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[employer])   Cognition  Overall Cognitive Status: Impaired/Different from baseline Arousal/Alertness: Lethargic Orientation Level: Oriented X4 Attention: Sustained Sustained Attention: Impaired Sustained Attention Impairment: Functional basic Awareness: Impaired Awareness Impairment: Emergent impairment Problem Solving: Impaired Problem  Solving Impairment: Functional basic Behaviors: Impulsive Safety/Judgment: Impaired    Comprehension  Auditory Comprehension Overall Auditory Comprehension: Appears within functional limits for tasks assessed (for basic tasks assessed) Commands: Within Functional Limits (for one-step commands) Visual Recognition/Discrimination Discrimination: Not tested Reading Comprehension Reading Status: Not tested    Expression Expression Primary Mode of Expression: Verbal Verbal Expression Overall Verbal Expression: Appears within functional limits for tasks assessed Written Expression Dominant Hand: Left Written Expression: Not tested   Oral / Motor Oral Motor/Sensory Function Overall Oral Motor/Sensory Function: Impaired Labial ROM: Reduced left Labial Symmetry: Abnormal symmetry left Labial Strength: Reduced Labial Sensation: Reduced Lingual ROM: Within Functional Limits Lingual Symmetry: Within Functional Limits Lingual Strength: Within Functional Limits Facial ROM: Reduced left Facial Symmetry: Left droop Facial Strength: Reduced Facial Sensation: Reduced Mandible: Within Functional Limits Motor Speech Overall Motor Speech: Impaired Respiration: Within functional limits Phonation: Normal Resonance: Within functional limits Articulation: Impaired Level of Impairment: Word Intelligibility: Intelligibility reduced Word: 50-74% accurate Phrase: 50-74% accurate Sentence: 50-74% accurate Motor Planning: Witnin functional limits Motor Speech Errors: Not applicable   GO      [name], M.A. CCC-SLP [phone]  [name] 02/13/2014, 10:52 AM

## 2014-02-13 NOTE — Progress Notes (Signed)
Pt was unable to void when attempted, bladder scan done, 29ml recorded, Dr Raelene Bott paged and notified,ordered an in and out cath same done at 0154,drained about 372ml of clear urine out, pt made comfortable in bed, will continue to monitor. Obasogie-Asidi, Neoma Uhrich Efe

## 2014-02-13 NOTE — Progress Notes (Signed)
Subjective:  Patient was seen and examined this morning. Patient complains of headache in the right temple and discomfort from the foley catheter.   Objective: Vital signs in last 24 hours: Filed Vitals:   02/13/14 0200 02/13/14 0554 02/13/14 0601 02/13/14 0952  BP: 130/73 133/65  106/58  Pulse: 69 60  66  Temp: 98.1 F (36.7 C) 97.3 F (36.3 C) 98.6 F (37 C) 98.1 F (36.7 C)  TempSrc: Oral Oral Oral Oral  Resp: 20 18  18   Height:      Weight:      SpO2: 98% 97%  97%   Weight change:   Intake/Output Summary (Last 24 hours) at 02/13/14 1203 Last data filed at 02/13/14 0154  Gross per 24 hour  Intake      0 ml  Output    950 ml  Net   -950 ml   General: Vital signs reviewed. Patient is well-developed and well-nourished, in no acute distress and cooperative with exam.  Head: Normocephalic and atraumatic. Eyes: Right gaze preference, but improved left gaze from yesterday. PERRLA.  Cardiovascular: RRR, S1 normal, S2 normal, no murmurs, gallops, or rubs. Sternotomy scar. Pulmonary/Chest: Clear to auscultation bilaterally, no wheezes, rales, or rhonchi. Abdominal: Soft, obese, non-tender, non-distended, BS +. Musculoskeletal: No joint deformities, erythema, or stiffness, ROM full and nontender. Extremities: No lower extremity edema bilaterally, pulses symmetric and intact bilaterally. No cyanosis or clubbing. GU: Foley in place Neurological: A&Ox3, Strength 0/5 in L upper extremity and left lower ext, sensory NOT intact to light touch in left upper extremity. Aphasia mildly improved. Skin: Warm, dry and intact. No rashes or erythema. Psychiatric: Normal mood and affect. Cognition and memory are normal.   Lab Results: Basic Metabolic Panel:  Recent Labs Lab 02/12/14 0950 02/12/14 1004  NA 140 139  K 4.1 3.8  CL 106 109  CO2 21  --   GLUCOSE 137* 144*  BUN 13 13  CREATININE 1.09 1.10  CALCIUM 8.5  --    Liver Function Tests:  Recent Labs Lab 02/12/14 0950    AST 15  ALT 11  ALKPHOS 78  BILITOT 0.2*  PROT 7.1  ALBUMIN 3.2*   CBC:  Recent Labs Lab 02/12/14 0950 02/12/14 1004  WBC 6.7  --   NEUTROABS 3.5  --   HGB 12.0* 13.3  HCT 37.5* 39.0  MCV 86.6  --   PLT 197  --    Fasting Lipid Panel:  Recent Labs Lab 02/13/14 0537  CHOL 181  HDL 40  LDLCALC 120*  TRIG 105  CHOLHDL 4.5   Coagulation:  Recent Labs Lab 02/12/14 0950  LABPROT 13.7  INR 1.04   Urine Drug Screen: Drugs of Abuse     Component Value Date/Time   LABOPIA NONE DETECTED 02/12/2014 1900   COCAINSCRNUR NONE DETECTED 02/12/2014 1900   LABBENZ NONE DETECTED 02/12/2014 1900   AMPHETMU NONE DETECTED 02/12/2014 1900   THCU NONE DETECTED 02/12/2014 1900   LABBARB NONE DETECTED 02/12/2014 1900    Alcohol Level:  Recent Labs Lab 02/12/14 0950  ETH <11   Urinalysis:  Recent Labs Lab 02/12/14 1900  COLORURINE YELLOW  LABSPEC 1.015  PHURINE 8.0  GLUCOSEU NEGATIVE  HGBUR NEGATIVE  BILIRUBINUR NEGATIVE  KETONESUR NEGATIVE  PROTEINUR NEGATIVE  UROBILINOGEN 0.2  NITRITE NEGATIVE  LEUKOCYTESUR TRACE*   Studies/Results: Ct Head Wo Contrast  02/12/2014   CLINICAL DATA:  Left-sided weakness.  Slurred speech.  EXAM: CT HEAD WITHOUT CONTRAST  TECHNIQUE: Contiguous axial  images were obtained from the base of the skull through the vertex without contrast.  COMPARISON:  08/15/2010  FINDINGS: No evidence for acute hemorrhage, mass lesion, midline shift, hydrocephalus or large infarct. Diffuse mucosal thickening in the maxillary sinuses suggesting chronic changes. Postsurgical changes in the maxillary sinuses bilaterally. Diffuse mucosal disease in the ethmoid air cells. No acute bone abnormality. Subtle low density in the right frontal white matter appears unchanged.  IMPRESSION: No acute intracranial abnormality.  Paranasal sinus disease.   Electronically Signed   By: Markus Daft M.D.   On: 02/12/2014 10:13   [name]  02/12/2014   ADDENDUM  REPORT: 02/12/2014 16:16  ADDENDUM: These results were called by telephone at the time of interpretation on 02/12/2014 at 4:16 pm to Dr. Ronnald Ramp , who verbally acknowledged these results.   Electronically Signed   By: Franchot Gallo M.D.   On: 02/12/2014 16:16   02/12/2014   CLINICAL DATA:  Stroke  EXAM: MRI HEAD WITHOUT CONTRAST  MRA HEAD WITHOUT CONTRAST  TECHNIQUE: Multiplanar, multiecho pulse sequences of the brain and surrounding structures were obtained without intravenous contrast. Angiographic images of the head were obtained using MRA technique without contrast.  COMPARISON:  CT head 02/12/2014  FINDINGS: MRI HEAD FINDINGS  Acute infarct in the right MCA territory. Acute infarct involving the temporal tip and much to the temporal lobe. Acute infarct involves the posterior insula. There is acute infarct involving the right putamen and caudate. Negative for hemorrhage  Mild chronic microvascular ischemic changes in the white matter. Brainstem intact.  Ventricle size is normal.  No shift of the midline structures.  Negative for mass lesion.  Mucosal thickening in the paranasal sinuses.  MRA HEAD FINDINGS  Image quality degraded by motion.  Atherosclerotic irregularity and mild to moderate disease in the distal vertebral artery bilaterally. The basilar is patent. Superior cerebellar and posterior cerebral arteries are patent. Atherosclerotic irregularity and stenosis in the distal posterior cerebral artery bilaterally.  Right internal carotid artery is patent with atherosclerotic irregularity in the cavernous segment. Right anterior cerebral artery widely patent. Abrupt occlusion of the right M1 segment which may be due to an embolus.  Left internal carotid artery is patent. Hypoplastic left A1 segment which is patent. Anterior and middle cerebral arteries are patent bilaterally.  Negative for cerebral aneurysm.  IMPRESSION: Acute infarct right MCA territory involving the right temporal lobe, insula, and basal  ganglia. No hemorrhage or midline shift  Abrupt occlusion of the right M1 segment may represent embolus.  Electronically Signed: By: Franchot Gallo M.D. On: 02/12/2014 16:05   Mr Jodene Nam Head/brain Wo Cm  02/12/2014   ADDENDUM REPORT: 02/12/2014 16:16  ADDENDUM: These results were called by telephone at the time of interpretation on 02/12/2014 at 4:16 pm to Dr. Ronnald Ramp , who verbally acknowledged these results.   Electronically Signed   By: Franchot Gallo M.D.   On: 02/12/2014 16:16   02/12/2014   CLINICAL DATA:  Stroke  EXAM: MRI HEAD WITHOUT CONTRAST  MRA HEAD WITHOUT CONTRAST  TECHNIQUE: Multiplanar, multiecho pulse sequences of the brain and surrounding structures were obtained without intravenous contrast. Angiographic images of the head were obtained using MRA technique without contrast.  COMPARISON:  CT head 02/12/2014  FINDINGS: MRI HEAD FINDINGS  Acute infarct in the right MCA territory. Acute infarct involving the temporal tip and much to the temporal lobe. Acute infarct involves the posterior insula. There is acute infarct involving the right putamen and caudate. Negative for  hemorrhage  Mild chronic microvascular ischemic changes in the white matter. Brainstem intact.  Ventricle size is normal.  No shift of the midline structures.  Negative for mass lesion.  Mucosal thickening in the paranasal sinuses.  MRA HEAD FINDINGS  Image quality degraded by motion.  Atherosclerotic irregularity and mild to moderate disease in the distal vertebral artery bilaterally. The basilar is patent. Superior cerebellar and posterior cerebral arteries are patent. Atherosclerotic irregularity and stenosis in the distal posterior cerebral artery bilaterally.  Right internal carotid artery is patent with atherosclerotic irregularity in the cavernous segment. Right anterior cerebral artery widely patent. Abrupt occlusion of the right M1 segment which may be due to an embolus.  Left internal carotid artery is patent. Hypoplastic  left A1 segment which is patent. Anterior and middle cerebral arteries are patent bilaterally.  Negative for cerebral aneurysm.  IMPRESSION: Acute infarct right MCA territory involving the right temporal lobe, insula, and basal ganglia. No hemorrhage or midline shift  Abrupt occlusion of the right M1 segment may represent embolus.  Electronically Signed: By: Franchot Gallo M.D. On: 02/12/2014 16:05   Medications:  I have reviewed the patient's current medications. Prior to Admission:  Prescriptions prior to admission  Medication Sig Dispense Refill Last Dose  . aspirin 81 MG tablet Take 81 mg by mouth daily.   01/27/14  . carvedilol (COREG) 6.25 MG tablet Take 1 tablet (6.25 mg total) by mouth 2 (two) times daily. 180 tablet 3 Unknown  . clopidogrel (PLAVIX) 75 MG tablet Take 75 mg by mouth daily.   01/26/14  . furosemide (LASIX) 40 MG tablet Take 20 mg by mouth daily as needed.    Not Taking  . isosorbide mononitrate (IMDUR) 30 MG 24 hr tablet Take 0.5 tablets (15 mg total) by mouth daily. 45 tablet 1 02/01/2014  . nitroGLYCERIN (NITROSTAT) 0.4 MG SL tablet Place 0.4 mg under the tongue every 5 (five) minutes as needed. For chest pain   Not Taking  . omeprazole (PRILOSEC) 20 MG capsule Take 20 mg by mouth 2 (two) times daily.   02/02/2014  . ranolazine (RANEXA) 500 MG 12 hr tablet Take 1 tablet (500 mg total) by mouth 2 (two) times daily. 180 tablet 1 02/02/2014  . simvastatin (ZOCOR) 40 MG tablet Take 40 mg by mouth every evening.   02/01/2014  . Vitamin D, Ergocalciferol, (DRISDOL) 50000 UNITS CAPS capsule Take 50,000 Units by mouth every 7 (seven) days.   02/02/2014   Scheduled Meds: . aspirin  300 mg Rectal Daily   Or  . aspirin  325 mg Oral Daily  . atorvastatin  80 mg Oral q1800  . enoxaparin (LOVENOX) injection  30 mg Subcutaneous Q12H  . pantoprazole  40 mg Oral Daily   Continuous Infusions:  PRN Meds:.acetaminophen **OR** acetaminophen, sodium chloride Assessment/Plan: Active  Problems:   CVA (cerebral infarction)  CVA: MRI/MRA showed acute infarct right MCA territory involving the right temporal lobe, insula, and basal ganglia. No hemorrhage or midline shift and abrupt occlusion of the right M1 segment may represent embolus. Patient continues to have flaccid left extremities, but sensation and EOM movements are improving. UDS negative, UA negative, lipid profile shows total cholesterol 181, TG 105, HDL 40, and LDL 120. Patient passed his swallow screen and can have a dysphagia 2 diet. 2D Echo showed very limited views. Mild LVH with LVEF approximately 45%, mid to apical anteroseptal and apical inferior hypokinesis to akinesis. Grade 2 diastolic dysfunction. Ascending aorta mildly dilated at 40 mm.  Unable to assess PASP. Atrial septum not well visualized. -ASA 325 mg daily -Tylenol prn for headache -Oxy IR 5 mg once for HA -HgbA1c pending -2D Echocardiogram results pending -Carotid dopplers results pending -Risk factor modification -Telemetry monitoring -Frequent neuro checks -PT/OT/SLP -Bed Rest -Dysphagia 2 diet -Atorvastatin 80 mg daily -Foley for urinary retention and inability to void  CAD: Patient has prior history of MI with coronary angioplasty with stent x 3 2009/2012/2013 . Patient is on ASA 81 mg daily, Coreg 6.25 mg BID, Plavix 75 mg daily, Nitro prn, Imdur 15 mg daily, Ranolazine 500 mg BID and simvastatin 40 mg daily at home. Patient was given ASA 300 mg rectally since he failed his swallow screen.  -ASA 325 mg daily -Hold coreg, plavix, nitro, imdur and ranolazine  H/o DM: Last hemoglobin A1c 5.7 on 06/30/2011. Patient is not on any home diabetic medications.  -Dysphagia 2 diet -HgbA1c pending  HTN: Patient is on coreg 6.25 mg BID at home. BP on admission was 126/63. -Allow for permissive hypertension  HLD: Patient is on simvastatin 40 mg daily at home. Lipid profile shows total cholesterol 181, TG 105, HDL 40, and LDL 120. -Atorvastatin 80  mg daily  GERD: Patient is on omeprazole 20 mg daily at home. -Protonix 40 mg daily  OSA: Patient occasionally uses CPAP at home. -CPAP QHS  DVT/PE ppx: Lovenox 30 mg SQ BID  Dispo: Disposition is deferred at this time, awaiting improvement of current medical problems.  Anticipated discharge in approximately 1-2 day(s).   The patient does have a current PCP Tamsen Roers, MD) and does not need an [facility] hospital follow-up appointment after discharge.  The patient does not have transportation limitations that hinder transportation to clinic appointments.  .Services Needed at time of discharge: Y = Yes, Blank = No PT:   OT:   RN:   Equipment:   Other:     LOS: 1 day   Osa Craver, DO PGY-1 Internal Medicine Resident Pager # [phone] 02/13/2014 12:03 PM

## 2014-02-14 ENCOUNTER — Encounter (HOSPITAL_COMMUNITY): Payer: Self-pay | Admitting: Physical Medicine and Rehabilitation

## 2014-02-14 DIAGNOSIS — I6310 Cerebral infarction due to embolism of unspecified precerebral artery: Secondary | ICD-10-CM

## 2014-02-14 DIAGNOSIS — G8190 Hemiplegia, unspecified affecting unspecified side: Secondary | ICD-10-CM

## 2014-02-14 DIAGNOSIS — I6350 Cerebral infarction due to unspecified occlusion or stenosis of unspecified cerebral artery: Secondary | ICD-10-CM

## 2014-02-14 DIAGNOSIS — I63311 Cerebral infarction due to thrombosis of right middle cerebral artery: Secondary | ICD-10-CM

## 2014-02-14 LAB — CBC
HCT: 36.4 % — ABNORMAL LOW (ref 39.0–52.0)
Hemoglobin: 11.7 g/dL — ABNORMAL LOW (ref 13.0–17.0)
MCH: 28.5 pg (ref 26.0–34.0)
MCHC: 32.1 g/dL (ref 30.0–36.0)
MCV: 88.8 fL (ref 78.0–100.0)
Platelets: 171 10*3/uL (ref 150–400)
RBC: 4.1 MIL/uL — ABNORMAL LOW (ref 4.22–5.81)
RDW: 14 % (ref 11.5–15.5)
WBC: 8.8 10*3/uL (ref 4.0–10.5)

## 2014-02-14 LAB — BASIC METABOLIC PANEL
Anion gap: 15 (ref 5–15)
BUN: 11 mg/dL (ref 6–23)
CO2: 19 mEq/L (ref 19–32)
Calcium: 8.4 mg/dL (ref 8.4–10.5)
Chloride: 105 mEq/L (ref 96–112)
Creatinine, Ser: 1.08 mg/dL (ref 0.50–1.35)
GFR calc Af Amer: 82 mL/min — ABNORMAL LOW (ref >90)
GFR calc non Af Amer: 71 mL/min — ABNORMAL LOW (ref >90)
Glucose, Bld: 105 mg/dL — ABNORMAL HIGH (ref 70–99)
Potassium: 3.8 mEq/L (ref 3.7–5.3)
Sodium: 139 mEq/L (ref 137–147)

## 2014-02-14 MED ORDER — CLOPIDOGREL BISULFATE 75 MG PO TABS
75.0000 mg | ORAL_TABLET | Freq: Every day | ORAL | Status: DC
  Administered 2014-02-14 – 2014-02-15 (×2): 75 mg via ORAL
  Filled 2014-02-14 (×2): qty 1

## 2014-02-14 MED ORDER — ASPIRIN EC 81 MG PO TBEC
81.0000 mg | DELAYED_RELEASE_TABLET | Freq: Every day | ORAL | Status: DC
  Administered 2014-02-14 – 2014-02-15 (×2): 81 mg via ORAL
  Filled 2014-02-14 (×2): qty 1

## 2014-02-14 MED ORDER — OXYCODONE HCL 5 MG PO TABS
7.5000 mg | ORAL_TABLET | Freq: Once | ORAL | Status: AC
  Administered 2014-02-14: 7.5 mg via ORAL
  Filled 2014-02-14: qty 2

## 2014-02-14 MED ORDER — ALTEPLASE 2 MG IJ SOLR
2.0000 mg | Freq: Once | INTRAMUSCULAR | Status: AC
  Administered 2014-02-14: 2 mg
  Filled 2014-02-14: qty 2

## 2014-02-14 NOTE — Progress Notes (Signed)
Utilization review completed. Racheal Mathurin, RN, BSN. 

## 2014-02-14 NOTE — Progress Notes (Signed)
Occupational Therapy Evaluation Patient Details Name: Gregory Wiley MRN: [id number] DOB: 1950-06-28 Today's Date: 02/14/2014    History of Present Illness 63 yo male admitted with L facial droop and L arm weakness. NIH = 17 MRI (+) R MCA infarct PMH: MI with coronary angioplasty with stent x3 DM, HTN, CAD, obesity, OSA, CABG, R knee surg, hadn surg, retinal detachment surg   Clinical Impression   Pt with severe L neglect and L hemiplegia. Progressed pt to standing with max +2 assist due to severe L lateral lean and support to flaccid LUE. Completed stand-pivot transfer with total +3 assist with verbal and tactile cues for step sequence. Pt able to initiate minimal movement in LLE, but required manual assist to move L foot in place for safe descend to recliner. Pt will benefit from continued acute OT services to increase safety and independence with ADLs and mobility. Recommending CIR for continued therapy services at d/c.    Follow Up Recommendations  CIR    Equipment Recommendations  Other (comment) (TBD in next venue)    Recommendations for Other Services Rehab consult     Precautions / Restrictions Precautions Precautions: Fall Precaution Comments: L neglect, decreased arousal, L hemiplegia, ? pushing to the L Restrictions Weight Bearing Restrictions: No      Mobility Bed Mobility Overal bed mobility: Needs Assistance;+2 for physical assistance Bed Mobility: Rolling;Sidelying to Sit Rolling: Max assist Sidelying to sit: Total assist;+2 for physical assistance       General bed mobility comments: Pt requires total A verbal and sound cues for attending to L to locate rail.  also requires [facility] assist for finding rail in order to roll to L side.  Max A for rolling to L side and total A (+2) for lowering BLEs, maintaining trunk in hip and trunk flexion for weight shift over to the R hip for sitting position.    Transfers Overall transfer level: Needs assistance Equipment used:  2 person hand held assist Transfers: Sit to/from [facility] Sit to Stand: Max assist;+2 physical assistance Stand pivot transfers: Total assist;+2 physical assistance       General transfer comment: Pt able to stand x 3 reps with max A (+2 for safety and forward weight shift as pt is impulsive with movement),  Cues for hand placement, safety and increasing forward trunk lean for increased WB through LEs.  Performed stand pivot transfer to recliner.  Requires mod cues for increased weight shift to the R for better ability to advance LLE, however still required physical assist to move LLE.      Balance Overall balance assessment: Needs assistance Sitting-balance support: Single extremity supported;Feet supported Sitting balance-Leahy Scale: Fair Sitting balance - Comments: Pt able to maintain balance at S level if using R UE for support, however if attempting any dynamic task, requires up to total A to prevent LOB to the L.  Postural control: Left lateral lean Standing balance support: Single extremity supported;During functional activity Standing balance-Leahy Scale: Zero Standing balance comment: Requires +2 assist to stand due to L lateral lean                            ADL Overall ADL's : Needs assistance/impaired                                       General ADL  Comments: Progressed pt to standing and completed stand-pivot transfer to chair.     Vision Eye Alignment: Impaired (comment) (To the R) Alignment/Gaze Preference: Gaze right             Additional Comments: Pt unable to track to midline or to L visual field.   Perception     Praxis      Pertinent Vitals/Pain Pain Assessment: Faces Faces Pain Scale: Hurts even more Pain Location: head Pain Descriptors / Indicators: Aching Pain Intervention(s): Repositioned (vitals WFL)     Hand Dominance     Extremity/Trunk Assessment Upper Extremity Assessment Upper  Extremity Assessment: LUE deficits/detail LUE Deficits / Details: Pt with trace movement in scapula, but otherwise flaccid LUE Coordination: decreased fine motor;decreased gross motor   Lower Extremity Assessment Lower Extremity Assessment: LLE deficits/detail LLE Deficits / Details: Pt with trace movement in hip, otherwise flaccid.  Possible hamstring tone, difficult to assess due to body habitus and pain in L ankle LLE Sensation:  (WFL) LLE Coordination: decreased gross motor   Cervical / Trunk Assessment Cervical / Trunk Assessment: Kyphotic;Other exceptions Cervical / Trunk Exceptions: Pt with L lateral lean in sitting   Communication Communication Communication: Expressive difficulties   Cognition Arousal/Alertness: Lethargic Behavior During Therapy: Flat affect Overall Cognitive Status: Impaired/Different from baseline Area of Impairment: Attention;Following commands;Safety/judgement;Awareness   Current Attention Level: Sustained   Following Commands: Follows one step commands with increased time Safety/Judgement: Decreased awareness of safety;Decreased awareness of deficits Awareness: Emergent   General Comments: Pt requires increased time and mod/max cues for carrying out single step commands during session.    General Comments       Exercises       Shoulder Instructions      Home Living Family/patient expects to be discharged to:: Private residence Living Arrangements: Spouse/significant other Available Help at Discharge: Family;Available 24 hours/day (wife available for 24/7 S, no assist, daughter available intermittently) Type of Home: House Home Access: Stairs to enter [employer] of Steps: 2 and small threshold Entrance Stairs-Rails: None Home Layout: One level     Bathroom Shower/Tub: Tub/shower unit;Curtain Shower/tub characteristics: Architectural technologist: Standard     Home Equipment: Cane - single point;Hand held Tourist information centre manager -  2 wheels;Wheelchair - manual   Additional Comments: equipment is wife's as she has MS  Lives With: Spouse    Prior Functioning/Environment Level of Independence: Independent             OT Diagnosis: Disturbance of vision;Acute pain;Hemiplegia dominant side   OT Problem List: Decreased strength;Decreased range of motion;Decreased activity tolerance;Impaired balance (sitting and/or standing);Impaired vision/perception;Decreased coordination;Decreased cognition;Decreased safety awareness;Decreased knowledge of use of DME or AE;Decreased knowledge of precautions;Impaired tone;Obesity;Pain   OT Treatment/Interventions: Self-care/ADL training;Therapeutic exercise;Neuromuscular education;DME and/or AE instruction;Therapeutic activities;Visual/perceptual remediation/compensation;Patient/family education;Balance training    OT Goals(Current goals can be found in the care plan section) Acute Rehab OT Goals Patient Stated Goal: to get relief from pain in bottom OT Goal Formulation: With patient Time For Goal Achievement: 02/28/14 Potential to Achieve Goals: Good  OT Frequency: Min 3X/week   Barriers to D/C:            Co-evaluation PT/OT/SLP Co-Evaluation/Treatment: Yes Reason for Co-Treatment: Complexity of the patient's impairments (multi-system involvement);For patient/therapist safety PT goals addressed during session: Mobility/safety with mobility;Balance OT goals addressed during session: Strengthening/ROM;ADL's and self-care      End of Session Nurse Communication: Mobility status;Precautions  Activity Tolerance: Patient tolerated treatment well Patient left: in chair;Other (comment) (with PT)  Time: 7322-0254 OT Time Calculation (min): 23 min Charges:    G-Codes:    Redmond Baseman 03/15/2014, 12:59 PM

## 2014-02-14 NOTE — Discharge Summary (Signed)
Name: Gregory Wiley MRN: [id number] DOB: 1950/08/13 63 y.o. PCP: Tamsen Roers, MD  Date of Admission: 02/12/2014  9:28 AM Date of Discharge: 02/14/2014 Attending Physician: Dr. Lynnae January Discharge Diagnosis:  Principal Problem:   CVA (cerebral infarction) Active Problems:   CAD, RCA PCI '09, 10/11, CABG X 4 5/12   Hypertension, B/P has been low this admission   Hyperlipidemia   Morbid obesity   OSA on CPAP  Discharge Medications:   Medication List    TAKE these medications        aspirin 81 MG tablet  Take 81 mg by mouth daily.     carvedilol 6.25 MG tablet  Commonly known as:  COREG  Take 1 tablet (6.25 mg total) by mouth 2 (two) times daily.     clopidogrel 75 MG tablet  Commonly known as:  PLAVIX  Take 75 mg by mouth daily.     furosemide 40 MG tablet  Commonly known as:  LASIX  Take 20 mg by mouth daily as needed for fluid.     isosorbide mononitrate 30 MG 24 hr tablet  Commonly known as:  IMDUR  Take 0.5 tablets (15 mg total) by mouth daily.     nitroGLYCERIN 0.4 MG SL tablet  Commonly known as:  NITROSTAT  Place 0.4 mg under the tongue every 5 (five) minutes as needed. For chest pain     omeprazole 20 MG capsule  Commonly known as:  PRILOSEC  Take 20 mg by mouth 2 (two) times daily.     ranolazine 500 MG 12 hr tablet  Commonly known as:  RANEXA  Take 1 tablet (500 mg total) by mouth 2 (two) times daily.     simvastatin 40 MG tablet  Commonly known as:  ZOCOR  Take 40 mg by mouth every evening.     Vitamin D (Ergocalciferol) 50000 UNITS Caps capsule  Commonly known as:  DRISDOL  Take 50,000 Units by mouth every 7 (seven) days. Take on Mondays        Disposition and follow-up:   Mr.Gregory Wiley was discharged from [facility] in Good condition.  At the hospital follow up visit please address:  1. CVA: Patient had a large right MCA territory CVA; he was past time for tPA. Dopplers negative. TEE did not show embolic source.  Loop recorder was placed. Patient was started and should continue ASA mg 81 daily and plavix 75 mg daily.   HTN: Please assess blood pressure and adjust medications as necessary-- permissive hypertension was allowed for the first 72 hours and coreg was added back today as per cardiology and neurology.   CAD: Significant history of CAD. Will need to add back his Nitro prn, Imdur 15 mg daily, Ranolazine 500 mg BID, held during acute stroke. His simvastatin 40 mg daily was changed to atorvastatin 80 mg daily  2.  Labs / imaging needed at time of follow-up: None  3.  Pending labs/ test needing follow-up: None  Follow-up Appointments: Stroke clinic will call patient for appointment in 4 weeks Please schedule appointment with PCP Dr. Rex Kras once you are discharged from [place].  Discharge Instructions: Good luck with therapy in inpatient rehabilitation!  Consultations:  Neurology, PT/OT, CIR  Procedures Performed:  Ct Head Wo Contrast  02/12/2014   CLINICAL DATA:  Left-sided weakness.  Slurred speech.  EXAM: CT HEAD WITHOUT CONTRAST  TECHNIQUE: Contiguous axial images were obtained from the base of the skull through the vertex without contrast.  COMPARISON:  08/15/2010  FINDINGS: No evidence for acute hemorrhage, mass lesion, midline shift, hydrocephalus or large infarct. Diffuse mucosal thickening in the maxillary sinuses suggesting chronic changes. Postsurgical changes in the maxillary sinuses bilaterally. Diffuse mucosal disease in the ethmoid air cells. No acute bone abnormality. Subtle low density in the right frontal white matter appears unchanged.  IMPRESSION: No acute intracranial abnormality.  Paranasal sinus disease.   Electronically Signed   By: Markus Daft M.D.   On: 02/12/2014 10:13   [name]  02/13/2014   CLINICAL DATA:  Right MCA stroke.  Right M1 occlusion  EXAM: MRI HEAD AND MRA NECK WITH CONTRAST  TECHNIQUE: Multiplanar and multiecho pulse sequences of the neck  were obtained with intravenous contrast. Angiographic images of the neck were obtained using MRA technique with intravenous contast.  CONTRAST:  59mL MULTIHANCE GADOBENATE DIMEGLUMINE 529 MG/ML IV SOLN  COMPARISON:  MRI 02/12/2014  FINDINGS: Image quality degraded by mild motion.  3 vessel aortic arch. Proximal great vessels are patent. Signal loss in the proximal right subclavian artery likely is due to tortuosity.  Both vertebral arteries are patent to the basilar with antegrade flow. There is a severe focal stenosis of the distal left vertebral artery. The basilar is patent.  Right carotid bifurcation widely patent without significant stenosis or dissection. Cavernous carotid shows mild irregularity without significant stenosis. Abrupt occlusion of the right M1 segment as noted yesterday.  Left carotid bifurcation widely patent. Left cervical carotid patent. There is a moderate stenosis of the left internal carotid artery in the pre cavernous segment. This area was obscured by artifact yesterday.  IMPRESSION: Right carotid bifurcation is patent. Abrupt occlusion right M1 segment likely due to embolus.  Left carotid bifurcation is patent. Moderate stenosis of the pre cavernous internal carotid artery.  Severe stenosis distal left vertebral artery.   Electronically Signed   By: Franchot Gallo M.D.   On: 02/13/2014 13:36   [name]  02/12/2014   ADDENDUM REPORT: 02/12/2014 16:16  ADDENDUM: These results were called by telephone at the time of interpretation on 02/12/2014 at 4:16 pm to Dr. Ronnald Ramp , who verbally acknowledged these results.   Electronically Signed   By: Franchot Gallo M.D.   On: 02/12/2014 16:16   02/12/2014   CLINICAL DATA:  Stroke  EXAM: MRI HEAD WITHOUT CONTRAST  MRA HEAD WITHOUT CONTRAST  TECHNIQUE: Multiplanar, multiecho pulse sequences of the brain and surrounding structures were obtained without intravenous contrast. Angiographic images of the head were obtained using MRA  technique without contrast.  COMPARISON:  CT head 02/12/2014  FINDINGS: MRI HEAD FINDINGS  Acute infarct in the right MCA territory. Acute infarct involving the temporal tip and much to the temporal lobe. Acute infarct involves the posterior insula. There is acute infarct involving the right putamen and caudate. Negative for hemorrhage  Mild chronic microvascular ischemic changes in the white matter. Brainstem intact.  Ventricle size is normal.  No shift of the midline structures.  Negative for mass lesion.  Mucosal thickening in the paranasal sinuses.  MRA HEAD FINDINGS  Image quality degraded by motion.  Atherosclerotic irregularity and mild to moderate disease in the distal vertebral artery bilaterally. The basilar is patent. Superior cerebellar and posterior cerebral arteries are patent. Atherosclerotic irregularity and stenosis in the distal posterior cerebral artery bilaterally.  Right internal carotid artery is patent with atherosclerotic irregularity in the cavernous segment. Right anterior cerebral artery widely patent. Abrupt occlusion of the right M1  segment which may be due to an embolus.  Left internal carotid artery is patent. Hypoplastic left A1 segment which is patent. Anterior and middle cerebral arteries are patent bilaterally.  Negative for cerebral aneurysm.  IMPRESSION: Acute infarct right MCA territory involving the right temporal lobe, insula, and basal ganglia. No hemorrhage or midline shift  Abrupt occlusion of the right M1 segment may represent embolus.  Electronically Signed: By: Franchot Gallo M.D. On: 02/12/2014 16:05   Mr Jodene Nam Head/brain Wo Cm  02/12/2014   ADDENDUM REPORT: 02/12/2014 16:16  ADDENDUM: These results were called by telephone at the time of interpretation on 02/12/2014 at 4:16 pm to Dr. Ronnald Ramp , who verbally acknowledged these results.   Electronically Signed   By: Franchot Gallo M.D.   On: 02/12/2014 16:16   02/12/2014   CLINICAL DATA:  Stroke  EXAM: MRI HEAD WITHOUT  CONTRAST  MRA HEAD WITHOUT CONTRAST  TECHNIQUE: Multiplanar, multiecho pulse sequences of the brain and surrounding structures were obtained without intravenous contrast. Angiographic images of the head were obtained using MRA technique without contrast.  COMPARISON:  CT head 02/12/2014  FINDINGS: MRI HEAD FINDINGS  Acute infarct in the right MCA territory. Acute infarct involving the temporal tip and much to the temporal lobe. Acute infarct involves the posterior insula. There is acute infarct involving the right putamen and caudate. Negative for hemorrhage  Mild chronic microvascular ischemic changes in the white matter. Brainstem intact.  Ventricle size is normal.  No shift of the midline structures.  Negative for mass lesion.  Mucosal thickening in the paranasal sinuses.  MRA HEAD FINDINGS  Image quality degraded by motion.  Atherosclerotic irregularity and mild to moderate disease in the distal vertebral artery bilaterally. The basilar is patent. Superior cerebellar and posterior cerebral arteries are patent. Atherosclerotic irregularity and stenosis in the distal posterior cerebral artery bilaterally.  Right internal carotid artery is patent with atherosclerotic irregularity in the cavernous segment. Right anterior cerebral artery widely patent. Abrupt occlusion of the right M1 segment which may be due to an embolus.  Left internal carotid artery is patent. Hypoplastic left A1 segment which is patent. Anterior and middle cerebral arteries are patent bilaterally.  Negative for cerebral aneurysm.  IMPRESSION: Acute infarct right MCA territory involving the right temporal lobe, insula, and basal ganglia. No hemorrhage or midline shift  Abrupt occlusion of the right M1 segment may represent embolus.  Electronically Signed: By: Franchot Gallo M.D. On: 02/12/2014 16:05    TEE results: Impression:  1) Mild LVE with septal hypokinesis EF 45% 2) Negative bubble study with no PFO 3) No LAA thrombus 4) Mild  MR 5) AV sclerosis with no stenosis 6) Mild aortic root enlargement 3.9 cm 7) Normal RV 8) Mild TR 9) No pericardial effusion    Admission HPI: Mr. Headley is a 63 y/o M with PMHx of MI with coronary angioplasty with stent x 3 2009/2012/2013, DM, HTN, HLD, CAD, obesity, OSA who presented to the ED with left facial droop and left arm weakness. Patient and wife state he woke up this morning and stated he didn't feel good and his head hurt. Patient was leaning to his left and fell forward, but did not hit his head. His wife could tell his speech was slightly slurred and brought him to the ED. In the ED, slurred speech, left arm and left leg weakness, and left facial droop progressively got worse. Patient was also intermittently confused. He currently complains of a headache and reflux. Patient  has never had a stroke before, but has had an MI s/p stent placement and CABG. Patient takes ASA and Plavix at home, but he does not take his medications regularly. He recently went of plavix and ASA for an EGD to evaluate his PUD. Patient was restarted on his home regimen, but has not been very compliant.   Hospital Course by problem list: Principal Problem:   CVA (cerebral infarction) Active Problems:   CAD, RCA PCI '09, 10/11, CABG X 4 5/12   Hypertension, B/P has been low this admission   Hyperlipidemia   Morbid obesity   OSA on CPAP   R MCA CVA:  Patient presented with left facial droop and left arm weakness. Last known normal was at midnight, so patient was not a candidate for tPA. Symptoms became progressively worse in the ED with flaccid left extremities, right gaze preference without inability to abduct left eye to left and inability to adduct right to left. Patient also has loss of sensation in left lower extremity, slurred speech and left facial droop. Patient has no prior history of CVA. He takes aspirin 81 mg daily and plavix 75 mg daily at home, but has not been compliant with his medications.  CT head showed no acute intracranial abnormalities and paranasal sinus disease. NIHSS 17. Patient was seen by Neurology who recommends stroke work up. MRI/MRA showed acute infarct right MCA territory involving the right temporal lobe, insula, and basal ganglia. No hemorrhage or midline shift and abrupt occlusion of the right M1 segment may represent embolus. Patient continued to have flaccid left extremities, but sensation and EOM movements are improving. UDS negative, UA negative, lipid profile shows total cholesterol 181, TG 105, HDL 40, and LDL 120. Patient passed his swallow screen and can have a dysphagia 2 diet. 2D Echo showed very limited views. Mild LVH with LVEF approximately 45%, mid to apical anteroseptal and apical inferior hypokinesis to akinesis. Grade 2 diastolic dysfunction. Ascending aorta mildly dilated at 40 mm. Unable to assess PASP. Atrial septum not well visualized. MR Neck showed right carotid bifurcation is patent. Abrupt occlusion right M1 segment likely due to embolus. Left carotid bifurcation is patent. Moderate stenosis of the pre-cavernous internal carotid artery. Severe stenosis distal left vertebral artery. Carotid Doppler report showed 1-39% ICA stenosis.Vertebral artery flow is antegrade. HgbA1c 5.6. Patient was seen by Dr. Naaman Plummer, PM&R, and patient was recommended for CIR placement. Since patient had an embolic stroke, Neurology recommended a TEE to assess embolic stroke; it showed mild LVE with septal hypokinesis, EF 45%, mild MR, AV sclerosis, mild aortic root enlargement, mild TR with no obvious embolic source.  A loop recorder was placed on 02/15/14. Patient was continued on Plavix 75 mg daily and ASA 81 mg daily. Statin was increased to atorvastatin 80 mg daily.   CAD: Patient has prior history of MI with coronary angioplasty with stent x 3 2009/2012/2013 . Patient is on ASA 81 mg daily, Coreg 6.25 mg BID, Plavix 75 mg daily, Nitro prn, Imdur 15 mg daily, Ranolazine 500 mg  BID and simvastatin 40 mg daily at home. We continued Aspirin, Plavix and increased his statin to Atorvastatin 80 mg daily during hospitalization.  H/o DM: Hemoglobin A1c 5.6%. Patient is not on any home diabetic medications.   HTN: Patient is on coreg 6.25 mg BID at home. BP on admission was 126/63, then rose to 170s/80s. We held his home coreg on admission for permissive hypertension, but restarted it on 12/1.    HLD:  Patient is on simvastatin 40 mg daily at home. Lipid profile shows total cholesterol 181, TG 105, HDL 40, and LDL 120. We changed his medication to Atorvastatin 80 mg daily.  OSA: Patient occasionally uses CPAP at home. We ordered him CPAP QHS. Its use nightly was encouraged past discharge.  GERD: Patient is on omeprazole 20 mg daily at home. We placed him on Protonix 40 mg daily during hospitalization.   Discharge Vitals:   BP 173/82 mmHg  Pulse 65  Temp(Src) 98 F (36.7 C) (Oral)  Resp 17  Ht 6' (1.829 m)  Wt 350 lb (158.759 kg)  BMI 47.46 kg/m2  SpO2 96%  Discharge Labs:  Results for orders placed or performed during the hospital encounter of 02/12/14 (from the past 24 hour(s))  CBC     Status: Abnormal   Collection Time: 02/15/14  4:45 AM  Result Value Ref Range   WBC 9.3 4.0 - 10.5 K/uL   RBC 4.18 (L) 4.22 - 5.81 MIL/uL   Hemoglobin 11.5 (L) 13.0 - 17.0 g/dL   HCT 35.9 (L) 39.0 - 52.0 %   MCV 85.9 78.0 - 100.0 fL   MCH 27.5 26.0 - 34.0 pg   MCHC 32.0 30.0 - 36.0 g/dL   RDW 13.7 11.5 - 15.5 %   Platelets 180 150 - 400 K/uL  Basic metabolic panel     Status: Abnormal   Collection Time: 02/15/14  4:45 AM  Result Value Ref Range   Sodium 138 137 - 147 mEq/L   Potassium 3.7 3.7 - 5.3 mEq/L   Chloride 101 96 - 112 mEq/L   CO2 23 19 - 32 mEq/L   Glucose, Bld 104 (H) 70 - 99 mg/dL   BUN 11 6 - 23 mg/dL   Creatinine, Ser 1.04 0.50 - 1.35 mg/dL   Calcium 8.6 8.4 - 10.5 mg/dL   GFR calc non Af Amer 74 (L) >90 mL/min   GFR calc Af Amer 86 (L) >90 mL/min    Anion gap 14 5 - 15    Signed:  Venita Lick, MD  02/15/14 at 5:10PM

## 2014-02-14 NOTE — Evaluation (Signed)
Physical Therapy Evaluation Patient Details Name: Gregory Wiley MRN: [id number] DOB: 07/23/1950 Today's Date: 02/14/2014   History of Present Illness  63 yo male admitted with L facial droop and L arm weakness. NIH = 17 MRI (+) R MCA infarct PMH: MI with coronary angioplasty with stent x3 DM, HTN, CAD, obesity, OSA, CABG, R knee surg, hadn surg, retinal detachment surg  Clinical Impression  Pt presents with severe L neglect, L hemiplegia, decreased awareness, decreased ability to follow commands, decreased balance.  Able to tolerate standing x 3 reps, stand pivot transfer and gait x 4' using R handrail and +3 assist for safety.  PT recommends continued acute services to address deficits listed below.  Recommend CIR for follow up at DC.      Follow Up Recommendations CIR    Equipment Recommendations  Other (comment) (TBD)    Recommendations for Other Services Rehab consult     Precautions / Restrictions Precautions Precautions: Fall Precaution Comments: L neglect, decreased arousal, L hemiplegia, ? pushing to the L Restrictions Weight Bearing Restrictions: No      Mobility  Bed Mobility Overal bed mobility: +2 for physical assistance;Needs Assistance Bed Mobility: Rolling;Sidelying to Sit Rolling: Max assist Sidelying to sit: +2 for physical assistance;Total assist       General bed mobility comments: Pt requires total A verbal and sound cues for attending to L to locate rail.  also requires [facility] assist for finding rail in order to roll to L side.  Max A for rolling to L side and total A (+2) for lowering BLEs, maintaining trunk in hip and trunk flexion for weight shift over to the R hip for sitting position.    Transfers Overall transfer level: Needs assistance Equipment used: 2 person hand held assist (PT under L axilla) Transfers: Sit to/from Stand;Stand Pivot Transfers Sit to Stand: +2 physical assistance;Max assist Stand pivot transfers: +2 physical assistance;Total  assist       General transfer comment: Pt able to stand x 3 reps with max A (+2 for safety and forward weight shift as pt is impulsive with movement),  Cues for hand placement, safety and increasing forward trunk lean for increased WB through LEs.  Performed stand pivot transfer to recliner.  Requires mod cues for increased weight shift to the R for better ability to advance LLE, however still required physical assist to move LLE.    Ambulation/Gait Ambulation/Gait assistance: +2 physical assistance;Total assist (+3 for chair follow) Ambulation Distance (Feet): 4 Feet Assistive device:  (R handrail, PT under L axilla, OT in front, +3 chair follow) Gait Pattern/deviations: Step-to pattern;Decreased step length - left;Decreased stance time - left;Trunk flexed;Decreased weight shift to right;Wide base of support        Stairs            Wheelchair Mobility    Modified Rankin (Stroke Patients Only)       Balance Overall balance assessment: Needs assistance Sitting-balance support: Single extremity supported;Feet supported Sitting balance-Leahy Scale: Fair Sitting balance - Comments: Pt able to maintain balance at S level if using R UE for support, however if attempting any dynamic task, requires up to total A to prevent LOB to the L.  Postural control: Left lateral lean Standing balance support: During functional activity Standing balance-Leahy Scale: Zero Standing balance comment: Requires +2 assist at this time for standing.  Pertinent Vitals/Pain Pain Assessment: Faces Faces Pain Scale: Hurts even more Pain Location: head Pain Descriptors / Indicators: Aching Pain Intervention(s): Repositioned (vitals WFL)    Home Living Family/patient expects to be discharged to:: Private residence Living Arrangements: Spouse/significant other Available Help at Discharge: Family;Available 24 hours/day (wife available for 24/7 S, no assist,  daughter available intermittently) Type of Home: House Home Access: Stairs to enter Entrance Stairs-Rails: None Entrance Stairs-Number of Steps: 2 and small threshold Home Layout: One level Home Equipment: Walker - 2 wheels;Wheelchair - manual Additional Comments: equipment is wife's as she has MS    Prior Function Level of Independence: Independent               Hand Dominance        Extremity/Trunk Assessment               Lower Extremity Assessment: LLE deficits/detail   LLE Deficits / Details: Pt with trace movement in hip, otherwise flaccid.  Possible hamstring tone, difficult to assess due to body habitus and pain in L ankle  Cervical / Trunk Assessment: Kyphotic;Other exceptions  Communication   Communication: Expressive difficulties  Cognition Arousal/Alertness: Lethargic Behavior During Therapy: Flat affect Overall Cognitive Status: Impaired/Different from baseline Area of Impairment: Attention;Following commands;Safety/judgement;Awareness   Current Attention Level: Sustained   Following Commands: Follows one step commands with increased time Safety/Judgement: Decreased awareness of safety;Decreased awareness of deficits Awareness: Intellectual   General Comments: Pt requires increased time and mod/max cues for carrying out single step commands during session.     General Comments General comments (skin integrity, edema, etc.): Pt with severe L neglect and was unable to track to midline or to the L during session.      Exercises        Assessment/Plan    PT Assessment Patient needs continued PT services  PT Diagnosis Difficulty walking;Generalized weakness;Hemiplegia dominant side;Acute pain;Abnormality of gait   PT Problem List Decreased strength;Decreased range of motion;Decreased activity tolerance;Decreased balance;Decreased mobility;Decreased coordination;Decreased cognition;Decreased knowledge of use of DME;Decreased safety  awareness;Decreased knowledge of precautions;Obesity;Pain  PT Treatment Interventions DME instruction;Gait training;Stair training;Functional mobility training;Therapeutic activities;Therapeutic exercise;Balance training;Neuromuscular re-education;Cognitive remediation;Patient/family education;Wheelchair mobility training   PT Goals (Current goals can be found in the Care Plan section) Acute Rehab PT Goals Patient Stated Goal: n/a PT Goal Formulation: With patient/family Time For Goal Achievement: 02/21/14 Potential to Achieve Goals: Good    Frequency Min 5X/week   Barriers to discharge Inaccessible home environment;Decreased caregiver support      Co-evaluation PT/OT/SLP Co-Evaluation/Treatment: Yes Reason for Co-Treatment: Complexity of the patient's impairments (multi-system involvement);For patient/therapist safety PT goals addressed during session: Mobility/safety with mobility;Balance         End of Session Equipment Utilized During Treatment: Gait belt Activity Tolerance: Patient limited by fatigue;Patient limited by lethargy Patient left: in chair;with call bell/phone within reach;with chair alarm set Nurse Communication: Mobility status;Need for lift equipment         Time: 1020-1106 PT Time Calculation (min) (ACUTE ONLY): 46 min   Charges:   PT Evaluation $Initial PT Evaluation Tier I: 1 Procedure PT Treatments $Therapeutic Activity: 23-37 mins   PT G Codes:          Denice Bors 02/14/2014, 11:27 AM

## 2014-02-14 NOTE — Progress Notes (Signed)
STROKE TEAM PROGRESS NOTE   HISTORY Gregory Wiley is a 63 y.o. male, left handed, with a past medical history significant for HTN,hyperlipidemia, DM, CAD s/p CABG, MI, ischemic cardiomyopathy, OSA on CPAP, and obesity, brought in as a code stroke by EMS due to acute onset of left hemiparesis, dysarthria, and left facial weakness NIHSS 17.. Wife  said that he went to bed at midnight and was well, woke up around 8:30 complaining of low back pain, had unsteady gait and fell to the floor. She said that she noticed that he was weak in the left side and had droopiness of the left face, and also had slurred speech. He did not improved and she called EMS. According to wife, these symptoms are worsened at time of admission but he presented as wake up stroke outside TPa and intervention windows. He was considered for CT angio/perfusion but iv access could not be obtained  Hence any intervention could not be done. Initial NIHSS 17.  CT brain showed no intracranial abnormality.    Date last known well: 02/12/14 Time last known well: midnight tPA Given: no, patient out of the window for thrombolysis. NIHSS: 17     SUBJECTIVE (INTERVAL HISTORY) Wife and daughter at bedside. They think patient is stable or minimally improving,   Wife states that patient was off of his asa and plavix due to bleeding ulcer but that he is not compliant with his medications anyway. Therapists recommend inpatient rehab. Carotid dopplers show no significant extracranial stenosis and Transthoraxic echo shows slight diminished EF 45% with mild apical hypokinesis but no definite clot   OBJECTIVE Temp:  [97.6 F (36.4 C)-99.8 F (37.7 C)] 97.8 F (36.6 C) (11/30 1101) Pulse Rate:  [54-69] 60 (11/30 1101) Cardiac Rhythm:  [-] Normal sinus rhythm (11/29 2053) Resp:  [18-20] 20 (11/30 1101) BP: (124-159)/(57-83) 159/79 mmHg (11/30 1101) SpO2:  [93 %-97 %] 96 % (11/30 1101)  No results for input(s): GLUCAP in the last 168  hours.  Recent Labs Lab 02/12/14 0950 02/12/14 1004 02/14/14 0530  NA 140 139 139  K 4.1 3.8 3.8  CL 106 109 105  CO2 21  --  19  GLUCOSE 137* 144* 105*  BUN 13 13 11   CREATININE 1.09 1.10 1.08  CALCIUM 8.5  --  8.4    Recent Labs Lab 02/12/14 0950  AST 15  ALT 11  ALKPHOS 78  BILITOT 0.2*  PROT 7.1  ALBUMIN 3.2*    Recent Labs Lab 02/12/14 0950 02/12/14 1004 02/14/14 0530  WBC 6.7  --  8.8  NEUTROABS 3.5  --   --   HGB 12.0* 13.3 11.7*  HCT 37.5* 39.0 36.4*  MCV 86.6  --  88.8  PLT 197  --  171   No results for input(s): CKTOTAL, CKMB, CKMBINDEX, TROPONINI in the last 168 hours.  Recent Labs  02/12/14 0950  LABPROT 13.7  INR 1.04    Recent Labs  02/12/14 1900  COLORURINE YELLOW  LABSPEC 1.015  PHURINE 8.0  GLUCOSEU NEGATIVE  HGBUR NEGATIVE  BILIRUBINUR NEGATIVE  KETONESUR NEGATIVE  PROTEINUR NEGATIVE  UROBILINOGEN 0.2  NITRITE NEGATIVE  LEUKOCYTESUR TRACE*       Component Value Date/Time   CHOL 181 02/13/2014 0537   TRIG 105 02/13/2014 0537   TRIG 113 01/04/2013 1206   HDL 40 02/13/2014 0537   CHOLHDL 4.5 02/13/2014 0537   VLDL 21 02/13/2014 0537   LDLCALC 120* 02/13/2014 0537   LDLCALC 129* 01/04/2013 1206  Lab Results  Component Value Date   HGBA1C 5.6 02/13/2014      Component Value Date/Time   LABOPIA NONE DETECTED 02/12/2014 1900   COCAINSCRNUR NONE DETECTED 02/12/2014 1900   LABBENZ NONE DETECTED 02/12/2014 1900   AMPHETMU NONE DETECTED 02/12/2014 1900   THCU NONE DETECTED 02/12/2014 1900   LABBARB NONE DETECTED 02/12/2014 1900     Recent Labs Lab 02/12/14 0950  ETH <11    CT Head Wo Contrast 02/12/2014    No acute intracranial abnormality.  Paranasal sinus disease.    [name] 02/12/2014    Acute infarct right MCA territory involving the right temporal lobe, insula, and basal ganglia. No hemorrhage or midline shift  Abrupt occlusion of the right M1 segment may represent embolus.     PHYSICAL  EXAM  PHYSICAL EXAM Physical exam: Exam: Gen: NAD Eyes: anicteric sclerae, moist conjunctivae                    CV: no MRG  Neuro: Mental Status: Alert, follows commands, oriented to name, month, date, location   Speech:    Dysarthria due to left facial weakness  Cranial Nerves:    The pupils are equal, round, and reactive to light. Right gaze preference but EOMI. Fundi not visualized, VF show diminished response to threat on left,  left facial weakness, decreased sensation left face  Motor Observation:    no involuntary movements noted. Tone appears normal.    Strength:   Dense left hemiparesis 0/5 LUE and 1/5 LLE strength. Normal strength on right side     Sensation:    Left hemisensory loss  Plantars left upgoing.   Gait: Unable to test     Cortical Sensory Modalities:    double simultaneous extinction. Right gaze preference improved.      ASSESSMENT/PLAN [name] is a 63 y.o. male with history of hypertension, hyperlipidemia, diabetes mellitus, coronary artery disease with previous coronary artery bypass graft surgery and MI, ischemic cardiomyopathy, obstructive sleep apnea on C Pap, and obesity presenting with left hemiparesis and dysarthria. He did not receive IV t-PA due to late presentation. Medical non compliance.  Stroke:  Non-dominant - MRI - Acute infarct right MCA territory. Abrupt occlusion of the right M1 segment may represent embolus.    Resultant  Dense left hemiplegia, hemisensory loss and hemianopsia  MRI - Acute infarct right middle cerebral artery territory.  MRA - Abrupt occlusion of the right M1 segment may represent embolus.   Carotid Doppler - 1-39% ICA stenosis. Vertebral artery flow is antegrade.   2D Echo - Left ventricle: The cavity size was normal. Wall thickness was increased in a pattern of mild LVH. The estimated ejection fraction was 45%. There is hypokinesis of the mid-apicalanteroseptal myocardium. Probable  hypokinesis of the apicalinferior myocardium.  LDL 120  HgbA1c 5.6  Lovenox for VTE prophylaxis  DIET DYS 2 with nectar thick liquids  He was on ASA and plavix before admission but per wife not compliant with his medications.             Recommend resuming ASA and Plavix.  Patient counseled to be compliant with his antithrombotic medications  Ongoing aggressive stroke risk factor management  Therapy recommendations: CLR  Disposition:  CLR  Consider cardiology consult for TEE/Loop recorder  Hypertension  Home meds: Coreg 6.25 mg twice a day Permissive hypertension <220/120 for 24-48 hours and then gradually normalize within 5-7 days BP goal long term normotensive  Hyperlipidemia  Home meds:  Zocor 40 mg daily, needs to be counseled on medication compliance  LDL 120, goal < 70  Continue statin at discharge  Diabetes  HgbA1c pending goal < 7.0   Other Stroke Risk Factors  Advanced age  Obesity, Body mass index is 47.46 kg/(m^2).   Coronary artery disease  Obstructive sleep apnea, on CPAP at home, not compliant  Other Active Problems  Dysphagia  Other Pertinent History Medication non compliance obstructive sleep apnea, not compliant with CPAP   Hospital day # 2     I have personally examined this patient, reviewed notes, independently viewed imaging studies, participated in medical decision making and plan of care. I have made any additions or clarifications directly to the above note. Agree with note above.  Long D/w patient, wife Dr Marvel Plan about his stroke and need for further w/u with TEE and loop recorder prior o rehab transfer Gregory Wiley, [place] Pager: [phone] 02/14/2014 1:23 PM    To contact Stroke Continuity provider, please refer to [email or web address] After hours, contact General Neurology

## 2014-02-14 NOTE — Progress Notes (Signed)
Speech Language Pathology Treatment: Dysphagia  Patient Details Name: Gregory Wiley MRN: [id number] DOB: 1950-06-17 Today's Date: 02/14/2014 Time: 3299-2426 SLP Time Calculation (min) (ACUTE ONLY): 20 min  Assessment / Plan / Recommendation Clinical Impression  Skilled treatment session focused on addressing dysphagia goals; however, spouse present and reported that patient is lethargic due to being medicated for a 9/10 headache.  As a result, PO not administered and session focused on addressing dysphagia education with spouse.  She accurately recalled all recommended safe swallow strategies and reported that she has to stay right next to patient during PO intake because if she steps away he takes large bites and chokes; she reported that this happened x1 this am.  SLP educated on need to remove tray if she steps away form patient due to cognitive deficits and current dysphagia; she verbalized understanding of information.       HPI HPI: Gregory Wiley is a 63 y/o M with PMHx of MI with coronary angioplasty with stent x 3 2009/2012/2013, DM, HTN, HLD, CAD, obesity, OSA who presented to the ED with left facial droop and left arm weakness. MRI revealed right MCA infarct.   Pertinent Vitals Pain Score: Asleep  SLP Plan  Continue with current plan of care.   Recommendations Diet recommendations: Dysphagia 2 (fine chop);Nectar-thick liquid Liquids provided via: Straw (straw on right) Medication Administration: Crushed with puree Supervision: Patient able to self feed;Full supervision/cueing for compensatory strategies Compensations: Slow rate;Small sips/bites;Check for pocketing;Check for anterior loss Postural Changes and/or Swallow Maneuvers: Seated upright 90 degrees              Oral Care Recommendations: Oral care before and after PO Follow up Recommendations: Inpatient Rehab;24 hour supervision/assistance    GO    Gregory Wiley., CCC-SLP [phone]  Gregory Wiley 02/14/2014,  4:36 PM

## 2014-02-14 NOTE — Progress Notes (Signed)
I met with pt and his wife at bedside. I will begin insurance approval with [place] [employer] for an inpt rehab stay once medical workup is complete. They are in agreement. [phone]

## 2014-02-14 NOTE — Progress Notes (Signed)
Pt. Family helps pt. With placing on cpap when pt. Is ready. RT informed family to notify if they needed any assistance.

## 2014-02-14 NOTE — Progress Notes (Signed)
CARE MANAGEMENT NOTE 02/14/2014  Patient:  Gregory Wiley, Gregory Wiley   Account Number:  [id number]  Date Initiated:  02/14/2014  Documentation initiated by:  [facility]  Subjective/Objective Assessment:   Acute infarct right MCA territory     Action/Plan:   Anticipated DC Date:  02/15/2014   Anticipated DC Plan:  [place]  CM consult      Choice offered to / List presented to:             Status of service:  Completed, signed off Medicare Important Message given?   (If response is "NO", the following Medicare IM given date fields will be blank) Date Medicare IM given:   Medicare IM given by:   Date Additional Medicare IM given:   Additional Medicare IM given by:    Discharge Disposition:  IP REHAB FACILITY  Per UR Regulation:    If discussed at Long Length of Stay Meetings, dates discussed:    Comments:  02/14/2014 1455 Waiting CIR approval. Jonnie Finner RN CCM Case Mgmt phone [phone]

## 2014-02-14 NOTE — Consult Note (Signed)
Physical Medicine and Rehabilitation Consult   Reason for Consult:  Left sided weakness, left inattention and left facial weakness Referring Physician: Dr. Beryle Beams   HPI: Gregory Wiley is a 63 y.o. male with history of CAD, DM type 2, HTN, OSA, esophagitis/duodenitis, who was admitted on 02/12/14 with left sided weakness with unsteady gait/fall, left facial droop and slurred speech. Patient has been off ASA/plavix due to recent bleeding ulcer. Family reports non-compliant with medications as well as CPAP use.   Patient with progressive weakness in ED. CT head negative.  MRI brain revealed acute infarct right MCA territory involving the right temporal lobe, insula, and basal ganglia and abrupt occlusion right M1 segment question due to embolus. MRA head/neck revealed bilateral carotid bifurcation patent, moderate stenosis precavernous  L-ICA and severe stenosis distal L-VA. 2D echo with EF 97%, grade 2 diastolic dysfunction and hypokinesis mid-apicalanteroseptal myocardium. Carotid dopplers with 1-39% ICA stenosis.  BSS done and patient started on dysphagia 2, nectar liquids due to dysphagia. Neurology following for input and recommended resuming ASA and plavix for likely embolic stroke. Cognitive evaluation revealed moderate dysarthria with difficulty attending to task as well as left inattention. PT/OT evaluations pending.  CIR recommended by MD and ST.    Review of Systems  HENT: Negative for hearing loss.   Eyes: Negative for blurred vision and double vision.  Respiratory: Positive for cough, shortness of breath and wheezing.   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 Negative for palpitations.  Gastrointestinal: Positive for nausea and constipation. Negative for blood in stool.  Genitourinary: Negative for dysuria and urgency.  Musculoskeletal: Positive for myalgias, back pain and neck pain.  Neurological: Positive for  sensory change, speech change, focal weakness and headaches. Negative for dizziness.  Psychiatric/Behavioral: The patient has insomnia.       Past Medical History  Diagnosis Date  . Coronary artery disease     stent, then CABG 07/20/10  . MI (myocardial infarction)     NSTEMI- last cath 06/2011-Stent to LCX-DES, last nuc 07/30/11 low risk  . Diabetes mellitus   . Hypertension   . Hyperlipidemia   . Obesity (BMI 30-39.9)   . OSA on CPAP     since July 2013  . H/O cardiomyopathy     ischemic, now last echo 07/30/11, EF 38%W  . Diverticulosis   . Internal hemorrhoids   . Tubular adenoma of colon   . Plantar fasciitis of left foot     Past Surgical History  Procedure Laterality Date  . Coronary artery bypass graft  07/20/2010     LIMA-LAD; VG-ACUTE MARG of RCA; Seq VG-distal RCA & then pda  . Coronary angioplasty with stent placement  07/01/2011    DES-Resolute to native LCX  . Coronary angioplasty with stent placement  12/01/2007    COMPLEX 5 LESION PCI INCLUDING CUTTING BALLOON AND 4 CYPHER DESs  . Hernia repair  2006  . Knee surgery Right   . Hand surgery      to take glass out  . Colonoscopy N/A 02/10/2013    Procedure: COLONOSCOPY;  Surgeon: Ladene Artist, MD;  Location: [facility];  Service: Endoscopy;  Laterality: N/A;  . Retinal detachment surgery      Family History  Problem Relation Age of Onset  . Diabetes type II Mother   . Coronary artery disease    . Colon cancer Neg Hx   . CAD Father 64  CABG, PCI, died at 35  . Heart attack Father 26  . Hypertension Brother   . Diabetes Brother   . Hyperlipidemia Brother   . Brain cancer Maternal Grandmother   . COPD Paternal Grandfather     Social History:  Married--wife disabled due to [place]. Independent and works part time driving cars for [employer]. He reports that he has quit smoking. He has never used smokeless tobacco. He reports that he drinks mixed drink on rare occasions. He reports that he does not use  illicit drugs.    Allergies  Allergen Reactions  . Diphenhydramine Hcl Anaphylaxis    Medications Prior to Admission  Medication Sig Dispense Refill  . aspirin 81 MG tablet Take 81 mg by mouth daily.    . carvedilol (COREG) 6.25 MG tablet Take 1 tablet (6.25 mg total) by mouth 2 (two) times daily. 180 tablet 3  . clopidogrel (PLAVIX) 75 MG tablet Take 75 mg by mouth daily.    . furosemide (LASIX) 40 MG tablet Take 20 mg by mouth daily as needed for fluid.     . isosorbide mononitrate (IMDUR) 30 MG 24 hr tablet Take 0.5 tablets (15 mg total) by mouth daily. 45 tablet 1  . nitroGLYCERIN (NITROSTAT) 0.4 MG SL tablet Place 0.4 mg under the tongue every 5 (five) minutes as needed. For chest pain    . omeprazole (PRILOSEC) 20 MG capsule Take 20 mg by mouth 2 (two) times daily.    . ranolazine (RANEXA) 500 MG 12 hr tablet Take 1 tablet (500 mg total) by mouth 2 (two) times daily. 180 tablet 1  . simvastatin (ZOCOR) 40 MG tablet Take 40 mg by mouth every evening.    . Vitamin D, Ergocalciferol, (DRISDOL) 50000 UNITS CAPS capsule Take 50,000 Units by mouth every 7 (seven) days. Take on Mondays      Home: [place] expects to be discharged to:: Unsure Living Arrangements: Spouse/significant other, Children  Lives With: Spouse  Functional History:   Functional Status:  Mobility:          ADL:    Cognition: Cognition Overall Cognitive Status: Impaired/Different from baseline Arousal/Alertness: Lethargic Orientation Level: Oriented X4 Attention: Sustained Sustained Attention: Impaired Sustained Attention Impairment: Functional basic Awareness: Impaired Awareness Impairment: Emergent impairment Problem Solving: Impaired Problem Solving Impairment: Functional basic Behaviors: Impulsive Safety/Judgment: Impaired Cognition Overall Cognitive Status: Impaired/Different from baseline  Blood pressure 124/57, pulse 61, temperature 98.2 F (36.8 C), temperature  source Oral, resp. rate 18, height 6' (1.829 m), weight 158.759 kg (350 lb), SpO2 93 %. Physical Exam  Nursing note and vitals reviewed. Constitutional: He is oriented to person, place, and time. He appears well-developed and well-nourished. He appears lethargic. He is easily aroused.  Morbidly obese male--sleeping soundly with CPAP in place. Needed tactile cues to awaken.   HENT:  Head: Normocephalic and atraumatic.  Eyes: Conjunctivae are normal. Pupils are equal, round, and reactive to light.  Neck: Normal range of motion. Neck supple.  Cardiovascular: Normal rate and regular rhythm.   Respiratory: Effort normal. No respiratory distress. He has no wheezes. He has rhonchi in the right lower field. He has rales in the right lower field. He exhibits tenderness.  GI: Soft. Bowel sounds are normal. He exhibits no distension. There is no tenderness.  Neurological: He is oriented to person, place, and time and easily aroused. He appears lethargic.  Lethargic and had difficulty staying awake. Eggs noted in mouth despite wife reporting clearing of pocketed breakfast. Right gaze  preference with inability to move eyes to left field. L-HH with left inattention. Decreased awareness of deficits. Able to follow occasional two step commands with cues.  Left sided weakness with emerging tone and pain Left shoulder and left ankle with attempts at minimal movement. LUE: 0/5 prox to distal. LLE: 0/5 prox to distal. DTR's increased on left, toes up left foot.  Skin: Skin is warm and dry.  Psychiatric:  Flat, fatigued    Results for orders placed or performed during the hospital encounter of 02/12/14 (from the past 24 hour(s))  CBC     Status: Abnormal   Collection Time: 02/14/14  5:30 AM  Result Value Ref Range   WBC 8.8 4.0 - 10.5 K/uL   RBC 4.10 (L) 4.22 - 5.81 MIL/uL   Hemoglobin 11.7 (L) 13.0 - 17.0 g/dL   HCT 36.4 (L) 39.0 - 52.0 %   MCV 88.8 78.0 - 100.0 fL   MCH 28.5 26.0 - 34.0 pg   MCHC 32.1 30.0  - 36.0 g/dL   RDW 14.0 11.5 - 15.5 %   Platelets 171 150 - 400 K/uL  Basic metabolic panel     Status: Abnormal   Collection Time: 02/14/14  5:30 AM  Result Value Ref Range   Sodium 139 137 - 147 mEq/L   Potassium 3.8 3.7 - 5.3 mEq/L   Chloride 105 96 - 112 mEq/L   CO2 19 19 - 32 mEq/L   Glucose, Bld 105 (H) 70 - 99 mg/dL   BUN 11 6 - 23 mg/dL   Creatinine, Ser 1.08 0.50 - 1.35 mg/dL   Calcium 8.4 8.4 - 10.5 mg/dL   GFR calc non Af Amer 71 (L) >90 mL/min   GFR calc Af Amer 82 (L) >90 mL/min   Anion gap 15 5 - 15   Ct Head Wo Contrast  02/12/2014   CLINICAL DATA:  Left-sided weakness.  Slurred speech.  EXAM: CT HEAD WITHOUT CONTRAST  TECHNIQUE: Contiguous axial images were obtained from the base of the skull through the vertex without contrast.  COMPARISON:  08/15/2010  FINDINGS: No evidence for acute hemorrhage, mass lesion, midline shift, hydrocephalus or large infarct. Diffuse mucosal thickening in the maxillary sinuses suggesting chronic changes. Postsurgical changes in the maxillary sinuses bilaterally. Diffuse mucosal disease in the ethmoid air cells. No acute bone abnormality. Subtle low density in the right frontal white matter appears unchanged.  IMPRESSION: No acute intracranial abnormality.  Paranasal sinus disease.   Electronically Signed   By: Markus Daft M.D.   On: 02/12/2014 10:13   [name]  02/13/2014   CLINICAL DATA:  Right MCA stroke.  Right M1 occlusion  EXAM: MRI HEAD AND MRA NECK WITH CONTRAST  TECHNIQUE: Multiplanar and multiecho pulse sequences of the neck were obtained with intravenous contrast. Angiographic images of the neck were obtained using MRA technique with intravenous contast.  CONTRAST:  52mL MULTIHANCE GADOBENATE DIMEGLUMINE 529 MG/ML IV SOLN  COMPARISON:  MRI 02/12/2014  FINDINGS: Image quality degraded by mild motion.  3 vessel aortic arch. Proximal great vessels are patent. Signal loss in the proximal right subclavian artery likely is  due to tortuosity.  Both vertebral arteries are patent to the basilar with antegrade flow. There is a severe focal stenosis of the distal left vertebral artery. The basilar is patent.  Right carotid bifurcation widely patent without significant stenosis or dissection. Cavernous carotid shows mild irregularity without significant stenosis. Abrupt occlusion of the right M1 segment as noted  yesterday.  Left carotid bifurcation widely patent. Left cervical carotid patent. There is a moderate stenosis of the left internal carotid artery in the pre cavernous segment. This area was obscured by artifact yesterday.  IMPRESSION: Right carotid bifurcation is patent. Abrupt occlusion right M1 segment likely due to embolus.  Left carotid bifurcation is patent. Moderate stenosis of the pre cavernous internal carotid artery.  Severe stenosis distal left vertebral artery.   Electronically Signed   By: Franchot Gallo M.D.   On: 02/13/2014 13:36   [name]  02/12/2014   ADDENDUM REPORT: 02/12/2014 16:16  ADDENDUM: These results were called by telephone at the time of interpretation on 02/12/2014 at 4:16 pm to Dr. Ronnald Ramp , who verbally acknowledged these results.   Electronically Signed   By: Franchot Gallo M.D.   On: 02/12/2014 16:16   02/12/2014   CLINICAL DATA:  Stroke  EXAM: MRI HEAD WITHOUT CONTRAST  MRA HEAD WITHOUT CONTRAST  TECHNIQUE: Multiplanar, multiecho pulse sequences of the brain and surrounding structures were obtained without intravenous contrast. Angiographic images of the head were obtained using MRA technique without contrast.  COMPARISON:  CT head 02/12/2014  FINDINGS: MRI HEAD FINDINGS  Acute infarct in the right MCA territory. Acute infarct involving the temporal tip and much to the temporal lobe. Acute infarct involves the posterior insula. There is acute infarct involving the right putamen and caudate. Negative for hemorrhage  Mild chronic microvascular ischemic changes in the white matter.  Brainstem intact.  Ventricle size is normal.  No shift of the midline structures.  Negative for mass lesion.  Mucosal thickening in the paranasal sinuses.  MRA HEAD FINDINGS  Image quality degraded by motion.  Atherosclerotic irregularity and mild to moderate disease in the distal vertebral artery bilaterally. The basilar is patent. Superior cerebellar and posterior cerebral arteries are patent. Atherosclerotic irregularity and stenosis in the distal posterior cerebral artery bilaterally.  Right internal carotid artery is patent with atherosclerotic irregularity in the cavernous segment. Right anterior cerebral artery widely patent. Abrupt occlusion of the right M1 segment which may be due to an embolus.  Left internal carotid artery is patent. Hypoplastic left A1 segment which is patent. Anterior and middle cerebral arteries are patent bilaterally.  Negative for cerebral aneurysm.  IMPRESSION: Acute infarct right MCA territory involving the right temporal lobe, insula, and basal ganglia. No hemorrhage or midline shift  Abrupt occlusion of the right M1 segment may represent embolus.  Electronically Signed: By: Franchot Gallo M.D. 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 Does the need for close, 24 hr/day medical supervision in concert with the patient's rehab needs make it unreasonable for this patient to be served in a less intensive setting? Yes 2. Co-Morbidities requiring supervision/potential complications: htn, post-stroke sequelae 3. Due to bladder management, bowel management, safety, skin/wound care, disease management, medication administration, pain management and patient education, does the patient require 24 hr/day rehab nursing? Yes 4. Does the patient require coordinated care of a physician, rehab nurse, PT (1-2 hrs/day, 5 days/week), OT (1-2 hrs/day, 5 days/week) and SLP (1-2 hrs/day, 5 days/week) to  address physical and functional deficits in the context of the above medical diagnosis(es)? Yes Addressing deficits in the following areas: balance, endurance, locomotion, strength, transferring, bowel/bladder control, bathing, dressing, feeding, grooming, toileting, cognition, speech, swallowing and psychosocial support 5. Can the patient actively participate in an intensive therapy program of at least 3 hrs  of therapy per day at least 5 days per week? Yes 6. The potential for patient to make measurable gains while on inpatient rehab is excellent 7. Anticipated functional outcomes upon discharge from inpatient rehab are min assist  with PT, min assist and mod assist with OT, supervision with SLP. 8. Estimated rehab length of stay to reach the above functional goals is: 20-25 days 9. Does the patient have adequate social supports and living environment to accommodate these discharge functional goals? Yes 10. Anticipated D/C setting: Home 11. Anticipated post D/C treatments: HH therapy and Outpatient therapy 12. Overall Rehab/Functional Prognosis: good  RECOMMENDATIONS: This patient's condition is appropriate for continued rehabilitative care in the following setting: CIR Patient has agreed to participate in recommended program. Yes Note that insurance prior authorization may be required for reimbursement for recommended care.  Comment: Rehab Admissions Coordinator to follow up.  Thanks,  Meredith Staggers, MD, Mellody Drown     02/14/2014

## 2014-02-14 NOTE — Plan of Care (Signed)
Problem: Acute Treatment Outcomes Goal: Airway maintained/protected Outcome: Completed/Met Date Met:  02/14/14 Goal: tPA Patient w/o S&S of bleeding Outcome: Not Applicable Date Met:  05/30/92  Problem: Progression Outcomes Goal: Communication method established Outcome: Completed/Met Date Met:  02/14/14 Goal: If vent dependent, tolerates weaning Outcome: Not Applicable Date Met:  58/59/29 Goal: Bowel & Bladder Continence Outcome: Completed/Met Date Met:  02/14/14

## 2014-02-14 NOTE — Progress Notes (Signed)
Subjective:  Patient was seen and examined this morning. Patient has complaint of R temporal headache, but believes sensation and eye movements are improving.   Objective: Vital signs in last 24 hours: Filed Vitals:   02/13/14 2200 02/14/14 0139 02/14/14 0538 02/14/14 1101  BP: 138/69 139/75 124/57 159/79  Pulse: 64 54 61 60  Temp: 97.7 F (36.5 C) 98.1 F (36.7 C) 98.2 F (36.8 C) 97.8 F (36.6 C)  TempSrc: Axillary Axillary Oral Oral  Resp: 18 18  20   Height:      Weight:      SpO2: 96% 93% 93% 96%   Weight change:   Intake/Output Summary (Last 24 hours) at 02/14/14 1102 Last data filed at 02/14/14 1034  Gross per 24 hour  Intake     30 ml  Output   1000 ml  Net   -970 ml   General: Vital signs reviewed. Patient is well-developed and well-nourished, in no acute distress and cooperative with exam.  Head: Normocephalic and atraumatic. Eyes: Right gaze preference, but improved left gaze from yesterday. PERRLA.  Cardiovascular: RRR, S1 normal, S2 normal, no murmurs, gallops, or rubs. Sternotomy scar. Pulmonary/Chest: Clear to auscultation bilaterally, no wheezes, rales, or rhonchi. Abdominal: Soft, obese, non-tender, non-distended, BS +. Musculoskeletal: No joint deformities, erythema, or stiffness, ROM full and nontender. Extremities: No lower extremity edema bilaterally, pulses symmetric and intact bilaterally. No cyanosis or clubbing. GU: Foley in place Neurological: A&Ox3, Strength 0/5 in L upper extremity and left lower ext, sensory NOT intact to light touch in left upper extremity. Aphasia mildly improved. Skin: Warm, dry and intact. No rashes or erythema. Psychiatric: Normal mood and affect. Cognition and memory are normal.   Lab Results: Basic Metabolic Panel:  Recent Labs Lab 02/12/14 0950 02/12/14 1004 02/14/14 0530  NA 140 139 139  K 4.1 3.8 3.8  CL 106 109 105  CO2 21  --  19  GLUCOSE 137* 144* 105*  BUN 13 13 11   CREATININE 1.09 1.10 1.08    CALCIUM 8.5  --  8.4   Liver Function Tests:  Recent Labs Lab 02/12/14 0950  AST 15  ALT 11  ALKPHOS 78  BILITOT 0.2*  PROT 7.1  ALBUMIN 3.2*   CBC:  Recent Labs Lab 02/12/14 0950 02/12/14 1004 02/14/14 0530  WBC 6.7  --  8.8  NEUTROABS 3.5  --   --   HGB 12.0* 13.3 11.7*  HCT 37.5* 39.0 36.4*  MCV 86.6  --  88.8  PLT 197  --  171   Fasting Lipid Panel:  Recent Labs Lab 02/13/14 0537  CHOL 181  HDL 40  LDLCALC 120*  TRIG 105  CHOLHDL 4.5   Coagulation:  Recent Labs Lab 02/12/14 0950  LABPROT 13.7  INR 1.04   Urine Drug Screen: Drugs of Abuse     Component Value Date/Time   LABOPIA NONE DETECTED 02/12/2014 1900   COCAINSCRNUR NONE DETECTED 02/12/2014 1900   LABBENZ NONE DETECTED 02/12/2014 1900   AMPHETMU NONE DETECTED 02/12/2014 1900   THCU NONE DETECTED 02/12/2014 1900   LABBARB NONE DETECTED 02/12/2014 1900    Alcohol Level:  Recent Labs Lab 02/12/14 0950  ETH <11   Urinalysis:  Recent Labs Lab 02/12/14 1900  COLORURINE YELLOW  LABSPEC 1.015  PHURINE 8.0  GLUCOSEU NEGATIVE  HGBUR NEGATIVE  BILIRUBINUR NEGATIVE  KETONESUR NEGATIVE  PROTEINUR NEGATIVE  UROBILINOGEN 0.2  NITRITE NEGATIVE  LEUKOCYTESUR TRACE*   Studies/Results: [name]  02/13/2014  CLINICAL DATA:  Right MCA stroke.  Right M1 occlusion  EXAM: MRI HEAD AND MRA NECK WITH CONTRAST  TECHNIQUE: Multiplanar and multiecho pulse sequences of the neck were obtained with intravenous contrast. Angiographic images of the neck were obtained using MRA technique with intravenous contast.  CONTRAST:  30mL MULTIHANCE GADOBENATE DIMEGLUMINE 529 MG/ML IV SOLN  COMPARISON:  MRI 02/12/2014  FINDINGS: Image quality degraded by mild motion.  3 vessel aortic arch. Proximal great vessels are patent. Signal loss in the proximal right subclavian artery likely is due to tortuosity.  Both vertebral arteries are patent to the basilar with antegrade flow. There is a severe  focal stenosis of the distal left vertebral artery. The basilar is patent.  Right carotid bifurcation widely patent without significant stenosis or dissection. Cavernous carotid shows mild irregularity without significant stenosis. Abrupt occlusion of the right M1 segment as noted yesterday.  Left carotid bifurcation widely patent. Left cervical carotid patent. There is a moderate stenosis of the left internal carotid artery in the pre cavernous segment. This area was obscured by artifact yesterday.  IMPRESSION: Right carotid bifurcation is patent. Abrupt occlusion right M1 segment likely due to embolus.  Left carotid bifurcation is patent. Moderate stenosis of the pre cavernous internal carotid artery.  Severe stenosis distal left vertebral artery.   Electronically Signed   By: Franchot Gallo M.D.   On: 02/13/2014 13:36   [name]  02/12/2014   ADDENDUM REPORT: 02/12/2014 16:16  ADDENDUM: These results were called by telephone at the time of interpretation on 02/12/2014 at 4:16 pm to Dr. Ronnald Ramp , who verbally acknowledged these results.   Electronically Signed   By: Franchot Gallo M.D.   On: 02/12/2014 16:16   02/12/2014   CLINICAL DATA:  Stroke  EXAM: MRI HEAD WITHOUT CONTRAST  MRA HEAD WITHOUT CONTRAST  TECHNIQUE: Multiplanar, multiecho pulse sequences of the brain and surrounding structures were obtained without intravenous contrast. Angiographic images of the head were obtained using MRA technique without contrast.  COMPARISON:  CT head 02/12/2014  FINDINGS: MRI HEAD FINDINGS  Acute infarct in the right MCA territory. Acute infarct involving the temporal tip and much to the temporal lobe. Acute infarct involves the posterior insula. There is acute infarct involving the right putamen and caudate. Negative for hemorrhage  Mild chronic microvascular ischemic changes in the white matter. Brainstem intact.  Ventricle size is normal.  No shift of the midline structures.  Negative for mass lesion.   Mucosal thickening in the paranasal sinuses.  MRA HEAD FINDINGS  Image quality degraded by motion.  Atherosclerotic irregularity and mild to moderate disease in the distal vertebral artery bilaterally. The basilar is patent. Superior cerebellar and posterior cerebral arteries are patent. Atherosclerotic irregularity and stenosis in the distal posterior cerebral artery bilaterally.  Right internal carotid artery is patent with atherosclerotic irregularity in the cavernous segment. Right anterior cerebral artery widely patent. Abrupt occlusion of the right M1 segment which may be due to an embolus.  Left internal carotid artery is patent. Hypoplastic left A1 segment which is patent. Anterior and middle cerebral arteries are patent bilaterally.  Negative for cerebral aneurysm.  IMPRESSION: Acute infarct right MCA territory involving the right temporal lobe, insula, and basal ganglia. No hemorrhage or midline shift  Abrupt occlusion of the right M1 segment may represent embolus.  Electronically Signed: By: Franchot Gallo M.D. On: 02/12/2014 16:05   Mr Jodene Nam Head/brain Wo Cm  02/12/2014   ADDENDUM REPORT: 02/12/2014 16:16  ADDENDUM:  These results were called by telephone at the time of interpretation on 02/12/2014 at 4:16 pm to Dr. Ronnald Ramp , who verbally acknowledged these results.   Electronically Signed   By: Franchot Gallo M.D.   On: 02/12/2014 16:16   02/12/2014   CLINICAL DATA:  Stroke  EXAM: MRI HEAD WITHOUT CONTRAST  MRA HEAD WITHOUT CONTRAST  TECHNIQUE: Multiplanar, multiecho pulse sequences of the brain and surrounding structures were obtained without intravenous contrast. Angiographic images of the head were obtained using MRA technique without contrast.  COMPARISON:  CT head 02/12/2014  FINDINGS: MRI HEAD FINDINGS  Acute infarct in the right MCA territory. Acute infarct involving the temporal tip and much to the temporal lobe. Acute infarct involves the posterior insula. There is acute infarct involving the  right putamen and caudate. Negative for hemorrhage  Mild chronic microvascular ischemic changes in the white matter. Brainstem intact.  Ventricle size is normal.  No shift of the midline structures.  Negative for mass lesion.  Mucosal thickening in the paranasal sinuses.  MRA HEAD FINDINGS  Image quality degraded by motion.  Atherosclerotic irregularity and mild to moderate disease in the distal vertebral artery bilaterally. The basilar is patent. Superior cerebellar and posterior cerebral arteries are patent. Atherosclerotic irregularity and stenosis in the distal posterior cerebral artery bilaterally.  Right internal carotid artery is patent with atherosclerotic irregularity in the cavernous segment. Right anterior cerebral artery widely patent. Abrupt occlusion of the right M1 segment which may be due to an embolus.  Left internal carotid artery is patent. Hypoplastic left A1 segment which is patent. Anterior and middle cerebral arteries are patent bilaterally.  Negative for cerebral aneurysm.  IMPRESSION: Acute infarct right MCA territory involving the right temporal lobe, insula, and basal ganglia. No hemorrhage or midline shift  Abrupt occlusion of the right M1 segment may represent embolus.  Electronically Signed: By: Franchot Gallo M.D. On: 02/12/2014 16:05   Medications:  I have reviewed the patient's current medications. Prior to Admission:  Prescriptions prior to admission  Medication Sig Dispense Refill Last Dose  . aspirin 81 MG tablet Take 81 mg by mouth daily.   Past Week at Unknown time  . carvedilol (COREG) 6.25 MG tablet Take 1 tablet (6.25 mg total) by mouth 2 (two) times daily. 180 tablet 3 Past Week at 0800  . clopidogrel (PLAVIX) 75 MG tablet Take 75 mg by mouth daily.   Past Week at Unknown time  . furosemide (LASIX) 40 MG tablet Take 20 mg by mouth daily as needed for fluid.    Past Week at Unknown time  . isosorbide mononitrate (IMDUR) 30 MG 24 hr tablet Take 0.5 tablets (15 mg  total) by mouth daily. 45 tablet 1 Past Week at Unknown time  . nitroGLYCERIN (NITROSTAT) 0.4 MG SL tablet Place 0.4 mg under the tongue every 5 (five) minutes as needed. For chest pain   unknown at unknown  . omeprazole (PRILOSEC) 20 MG capsule Take 20 mg by mouth 2 (two) times daily.   Past Week at Unknown time  . ranolazine (RANEXA) 500 MG 12 hr tablet Take 1 tablet (500 mg total) by mouth 2 (two) times daily. 180 tablet 1 Past Week at Unknown time  . simvastatin (ZOCOR) 40 MG tablet Take 40 mg by mouth every evening.   Past Week at Unknown time  . Vitamin D, Ergocalciferol, (DRISDOL) 50000 UNITS CAPS capsule Take 50,000 Units by mouth every 7 (seven) days. Take on Mondays   Past Week at  Unknown time   Scheduled Meds: . aspirin EC  81 mg Oral Daily  . atorvastatin  80 mg Oral q1800  . clopidogrel  75 mg Oral Daily  . enoxaparin (LOVENOX) injection  30 mg Subcutaneous Q12H  . oxyCODONE  7.5 mg Oral Once  . pantoprazole  40 mg Oral Daily   Continuous Infusions:  PRN Meds:.acetaminophen **OR** acetaminophen, sodium chloride Assessment/Plan: Active Problems:   CVA (cerebral infarction)   Cerebral infarction due to unspecified mechanism  CVA: MRI/MRA showed acute infarct right MCA territory involving the right temporal lobe, insula, and basal ganglia. No hemorrhage or midline shift and abrupt occlusion of the right M1 segment may represent embolus. MR Neck showed right carotid bifurcation is patent. Abrupt occlusion right M1 segment likely due to embolus. Left carotid bifurcation is patent. Moderate stenosis of the pre-cavernous internal carotid artery. Severe stenosis distal left vertebral artery. Carotid Doppler preliminary report showed 1-39% ICA stenosis.Vertebral artery flow is antegrade. Final read pending. HgbA1c 5.6. Patient was seen by Dr. Naaman Plummer, PM&R, who agrees patient would be a great candidate for CIR. I will discuss this with CIR admissions today for possible placement today or  tomorrow. Since patient had an embolic stroke, Neurology recommends TEE to assess embolic stroke with possible loop recorder placement. -ASA 81 mg daily -Plavix 75 mg daily -Oxy IR 7.5 mg once for headache -Tylenol prn for headache -Telemetry monitoring -Frequent neuro checks -PT/OT/SLP -Bed Rest with PT -Dysphagia 2 diet -Atorvastatin 80 mg daily -Foley for urinary retention and inability to void -CIR -TEE  CAD: Patient has prior history of MI with coronary angioplasty with stent x 3 2009/2012/2013 . Patient is on ASA 81 mg daily, Coreg 6.25 mg BID, Plavix 75 mg daily, Nitro prn, Imdur 15 mg daily, Ranolazine 500 mg BID and simvastatin 40 mg daily at home. -ASA 81 mg daily -Plavix 75 mg daily -Atorvastatin 80 mg daily -Hold coreg, nitro, imdur and ranolazine  H/o DM: Last hemoglobin A1c 5.7 on 06/30/2011. Patient is not on any home diabetic medications. Repeat Hgb A1c 5.6.  -Dysphagia 2 diet  HTN: Patient is on coreg 6.25 mg BID at home. BP on admission was 126/63. BP has remained well controlled off of coreg  -Allow for permissive hypertension for 48-72 hours  HLD: Patient is on simvastatin 40 mg daily at home. Lipid profile shows total cholesterol 181, TG 105, HDL 40, and LDL 120. -Atorvastatin 80 mg daily  GERD: Patient is on omeprazole 20 mg daily at home. -Protonix 40 mg daily  OSA: Patient occasionally uses CPAP at home. -CPAP QHS  DVT/PE ppx: Lovenox 30 mg SQ BID  Dispo: Disposition is deferred at this time, awaiting improvement of current medical problems.  Anticipated discharge in approximately 1-2 day(s).   The patient does have a current PCP Tamsen Roers, MD) and does not need an [facility] hospital follow-up appointment after discharge.  The patient does not have transportation limitations that hinder transportation to clinic appointments.  .Services Needed at time of discharge: Y = Yes, Blank = No PT:   OT:   RN:   Equipment:   Other:     LOS: 2 days    Osa Craver, DO PGY-1 Internal Medicine Resident Pager # [phone] 02/14/2014 11:02 AM

## 2014-02-14 NOTE — Progress Notes (Signed)
    [facility] has been requested to perform a transesophageal echocardiogram on 12/01 for CVA.  After careful review of history and examination, the risks and benefits of transesophageal echocardiogram have been explained including risks of esophageal damage, perforation (1:10,000 risk), bleeding, pharyngeal hematoma as well as other potential complications associated with conscious sedation including aspiration, arrhythmia, respiratory failure and death. Alternatives to treatment were discussed, questions were answered. Patient is willing to proceed.   Rosaria Ferries, PA-C 02/14/2014 5:07 PM

## 2014-02-15 ENCOUNTER — Encounter (HOSPITAL_COMMUNITY)
Admission: EM | Disposition: A | Discharge status: Rehabilitation Facility | Payer: Self-pay | Source: Home / Self Care | Attending: Oncology

## 2014-02-15 ENCOUNTER — Encounter (HOSPITAL_COMMUNITY): Payer: Self-pay | Admitting: *Deleted

## 2014-02-15 ENCOUNTER — Inpatient Hospital Stay (HOSPITAL_COMMUNITY)
Admission: RE | Admit: 2014-02-15 | Discharge: 2014-03-15 | DRG: 057 | Disposition: A | Discharge status: Skilled Nursing Facility | Payer: 59 | Source: Intra-hospital | Attending: Physical Medicine & Rehabilitation | Admitting: Physical Medicine & Rehabilitation

## 2014-02-15 DIAGNOSIS — Z9114 Patient's other noncompliance with medication regimen: Secondary | ICD-10-CM | POA: Diagnosis present

## 2014-02-15 DIAGNOSIS — H538 Other visual disturbances: Secondary | ICD-10-CM | POA: Diagnosis present

## 2014-02-15 DIAGNOSIS — I252 Old myocardial infarction: Secondary | ICD-10-CM

## 2014-02-15 DIAGNOSIS — I69354 Hemiplegia and hemiparesis following cerebral infarction affecting left non-dominant side: Secondary | ICD-10-CM | POA: Diagnosis present

## 2014-02-15 DIAGNOSIS — I340 Nonrheumatic mitral (valve) insufficiency: Secondary | ICD-10-CM

## 2014-02-15 DIAGNOSIS — M25572 Pain in left ankle and joints of left foot: Secondary | ICD-10-CM | POA: Diagnosis present

## 2014-02-15 DIAGNOSIS — I69398 Other sequelae of cerebral infarction: Secondary | ICD-10-CM

## 2014-02-15 DIAGNOSIS — I69391 Dysphagia following cerebral infarction: Secondary | ICD-10-CM | POA: Diagnosis not present

## 2014-02-15 DIAGNOSIS — K2980 Duodenitis without bleeding: Secondary | ICD-10-CM | POA: Diagnosis present

## 2014-02-15 DIAGNOSIS — Z9119 Patient's noncompliance with other medical treatment and regimen: Secondary | ICD-10-CM | POA: Diagnosis present

## 2014-02-15 DIAGNOSIS — R1314 Dysphagia, pharyngoesophageal phase: Secondary | ICD-10-CM | POA: Diagnosis present

## 2014-02-15 DIAGNOSIS — K209 Esophagitis, unspecified: Secondary | ICD-10-CM | POA: Diagnosis present

## 2014-02-15 DIAGNOSIS — G4733 Obstructive sleep apnea (adult) (pediatric): Secondary | ICD-10-CM | POA: Diagnosis present

## 2014-02-15 DIAGNOSIS — Z87891 Personal history of nicotine dependence: Secondary | ICD-10-CM

## 2014-02-15 DIAGNOSIS — K259 Gastric ulcer, unspecified as acute or chronic, without hemorrhage or perforation: Secondary | ICD-10-CM | POA: Diagnosis present

## 2014-02-15 DIAGNOSIS — K5900 Constipation, unspecified: Secondary | ICD-10-CM | POA: Diagnosis not present

## 2014-02-15 DIAGNOSIS — I10 Essential (primary) hypertension: Secondary | ICD-10-CM | POA: Diagnosis present

## 2014-02-15 DIAGNOSIS — I2510 Atherosclerotic heart disease of native coronary artery without angina pectoris: Secondary | ICD-10-CM | POA: Diagnosis present

## 2014-02-15 DIAGNOSIS — E785 Hyperlipidemia, unspecified: Secondary | ICD-10-CM | POA: Diagnosis present

## 2014-02-15 DIAGNOSIS — H5333 Simultaneous visual perception without fusion: Secondary | ICD-10-CM | POA: Diagnosis present

## 2014-02-15 DIAGNOSIS — D62 Acute posthemorrhagic anemia: Secondary | ICD-10-CM | POA: Diagnosis present

## 2014-02-15 DIAGNOSIS — M19072 Primary osteoarthritis, left ankle and foot: Secondary | ICD-10-CM | POA: Diagnosis present

## 2014-02-15 DIAGNOSIS — R414 Neurologic neglect syndrome: Secondary | ICD-10-CM

## 2014-02-15 DIAGNOSIS — N390 Urinary tract infection, site not specified: Secondary | ICD-10-CM | POA: Diagnosis not present

## 2014-02-15 DIAGNOSIS — Z09 Encounter for follow-up examination after completed treatment for conditions other than malignant neoplasm: Secondary | ICD-10-CM

## 2014-02-15 DIAGNOSIS — M10072 Idiopathic gout, left ankle and foot: Secondary | ICD-10-CM | POA: Diagnosis present

## 2014-02-15 DIAGNOSIS — Z6841 Body Mass Index (BMI) 40.0 and over, adult: Secondary | ICD-10-CM

## 2014-02-15 DIAGNOSIS — I69392 Facial weakness following cerebral infarction: Secondary | ICD-10-CM

## 2014-02-15 DIAGNOSIS — I255 Ischemic cardiomyopathy: Secondary | ICD-10-CM | POA: Diagnosis present

## 2014-02-15 DIAGNOSIS — I639 Cerebral infarction, unspecified: Secondary | ICD-10-CM

## 2014-02-15 DIAGNOSIS — I69322 Dysarthria following cerebral infarction: Secondary | ICD-10-CM

## 2014-02-15 DIAGNOSIS — G8192 Hemiplegia, unspecified affecting left dominant side: Secondary | ICD-10-CM | POA: Diagnosis present

## 2014-02-15 DIAGNOSIS — M25579 Pain in unspecified ankle and joints of unspecified foot: Secondary | ICD-10-CM

## 2014-02-15 DIAGNOSIS — I63411 Cerebral infarction due to embolism of right middle cerebral artery: Secondary | ICD-10-CM | POA: Diagnosis present

## 2014-02-15 DIAGNOSIS — F4321 Adjustment disorder with depressed mood: Secondary | ICD-10-CM | POA: Diagnosis present

## 2014-02-15 DIAGNOSIS — Z9989 Dependence on other enabling machines and devices: Secondary | ICD-10-CM

## 2014-02-15 HISTORY — PX: TEE WITHOUT CARDIOVERSION: SHX5443

## 2014-02-15 HISTORY — PX: LOOP RECORDER IMPLANT: SHX5954

## 2014-02-15 LAB — CBC
HCT: 35.9 % — ABNORMAL LOW (ref 39.0–52.0)
Hemoglobin: 11.5 g/dL — ABNORMAL LOW (ref 13.0–17.0)
MCH: 27.5 pg (ref 26.0–34.0)
MCHC: 32 g/dL (ref 30.0–36.0)
MCV: 85.9 fL (ref 78.0–100.0)
Platelets: 180 10*3/uL (ref 150–400)
RBC: 4.18 MIL/uL — ABNORMAL LOW (ref 4.22–5.81)
RDW: 13.7 % (ref 11.5–15.5)
WBC: 9.3 10*3/uL (ref 4.0–10.5)

## 2014-02-15 LAB — BASIC METABOLIC PANEL
Anion gap: 14 (ref 5–15)
BUN: 11 mg/dL (ref 6–23)
CO2: 23 mEq/L (ref 19–32)
Calcium: 8.6 mg/dL (ref 8.4–10.5)
Chloride: 101 mEq/L (ref 96–112)
Creatinine, Ser: 1.04 mg/dL (ref 0.50–1.35)
GFR calc Af Amer: 86 mL/min — ABNORMAL LOW (ref >90)
GFR calc non Af Amer: 74 mL/min — ABNORMAL LOW (ref >90)
Glucose, Bld: 104 mg/dL — ABNORMAL HIGH (ref 70–99)
Potassium: 3.7 mEq/L (ref 3.7–5.3)
Sodium: 138 mEq/L (ref 137–147)

## 2014-02-15 SURGERY — ECHOCARDIOGRAM, TRANSESOPHAGEAL
Anesthesia: Moderate Sedation

## 2014-02-15 SURGERY — LOOP RECORDER IMPLANT
Anesthesia: LOCAL

## 2014-02-15 MED ORDER — GUAIFENESIN-DM 100-10 MG/5ML PO SYRP: 5.0000 mL | ORAL_SOLUTION | Freq: Four times a day (QID) | ORAL | Status: DC | PRN

## 2014-02-15 MED ORDER — FENTANYL CITRATE 0.05 MG/ML IJ SOLN
INTRAMUSCULAR | Status: DC | PRN
  Administered 2014-02-15 (×3): 25 ug via INTRAVENOUS

## 2014-02-15 MED ORDER — TRAZODONE HCL 50 MG PO TABS: 25.0000 mg | ORAL_TABLET | Freq: Every evening | ORAL | Status: DC | PRN

## 2014-02-15 MED ORDER — FLEET ENEMA 7-19 GM/118ML RE ENEM: 1.0000 | ENEMA | Freq: Once | RECTAL | Status: AC | PRN

## 2014-02-15 MED ORDER — FENTANYL CITRATE 0.05 MG/ML IJ SOLN
INTRAMUSCULAR | Status: AC
  Filled 2014-02-15: qty 2

## 2014-02-15 MED ORDER — BUTAMBEN-TETRACAINE-BENZOCAINE 2-2-14 % EX AERO
INHALATION_SPRAY | CUTANEOUS | Status: DC | PRN
  Administered 2014-02-15: 1 via TOPICAL

## 2014-02-15 MED ORDER — CARVEDILOL 6.25 MG PO TABS: 6.2500 mg | ORAL_TABLET | Freq: Two times a day (BID) | ORAL | Status: DC

## 2014-02-15 MED ORDER — BISACODYL 10 MG RE SUPP
10.0000 mg | Freq: Every day | RECTAL | Status: DC | PRN
  Administered 2014-02-17 – 2014-03-13 (×7): 10 mg via RECTAL
  Filled 2014-02-15 (×8): qty 1

## 2014-02-15 MED ORDER — PROCHLORPERAZINE EDISYLATE 5 MG/ML IJ SOLN
5.0000 mg | Freq: Four times a day (QID) | INTRAMUSCULAR | Status: DC | PRN
  Administered 2014-03-04: 10 mg via INTRAMUSCULAR
  Filled 2014-02-15: qty 2

## 2014-02-15 MED ORDER — ATORVASTATIN CALCIUM 80 MG PO TABS
80.0000 mg | ORAL_TABLET | Freq: Every day | ORAL | Status: DC
  Administered 2014-02-16 – 2014-03-14 (×27): 80 mg via ORAL
  Filled 2014-02-15 (×28): qty 1

## 2014-02-15 MED ORDER — ALUMINUM HYDROXIDE GEL 320 MG/5ML PO SUSP
30.0000 mL | Freq: Four times a day (QID) | ORAL | Status: DC | PRN
  Filled 2014-02-15: qty 30

## 2014-02-15 MED ORDER — ENOXAPARIN SODIUM 80 MG/0.8ML ~~LOC~~ SOLN
80.0000 mg | SUBCUTANEOUS | Status: DC
  Administered 2014-02-15 – 2014-03-14 (×28): 80 mg via SUBCUTANEOUS
  Filled 2014-02-15 (×30): qty 0.8

## 2014-02-15 MED ORDER — PANTOPRAZOLE SODIUM 40 MG PO TBEC
40.0000 mg | DELAYED_RELEASE_TABLET | Freq: Two times a day (BID) | ORAL | Status: DC
  Administered 2014-02-15: 40 mg via ORAL
  Filled 2014-02-15 (×2): qty 1

## 2014-02-15 MED ORDER — CLOPIDOGREL BISULFATE 75 MG PO TABS: 75.0000 mg | ORAL_TABLET | Freq: Every day | ORAL | Status: DC

## 2014-02-15 MED ORDER — SODIUM CHLORIDE 0.9 % IV SOLN: INTRAVENOUS | Status: DC

## 2014-02-15 MED ORDER — ASPIRIN 81 MG PO TBEC: 81.0000 mg | DELAYED_RELEASE_TABLET | Freq: Every day | ORAL | Status: DC

## 2014-02-15 MED ORDER — ASPIRIN EC 81 MG PO TBEC
81.0000 mg | DELAYED_RELEASE_TABLET | Freq: Every day | ORAL | Status: DC
  Administered 2014-02-16 – 2014-03-15 (×28): 81 mg via ORAL
  Filled 2014-02-15 (×31): qty 1

## 2014-02-15 MED ORDER — LIDOCAINE-EPINEPHRINE 1 %-1:100000 IJ SOLN
INTRAMUSCULAR | Status: AC
  Filled 2014-02-15: qty 1

## 2014-02-15 MED ORDER — CARVEDILOL 6.25 MG PO TABS
6.2500 mg | ORAL_TABLET | Freq: Two times a day (BID) | ORAL | Status: DC
  Administered 2014-02-15: 6.25 mg via ORAL
  Filled 2014-02-15: qty 1

## 2014-02-15 MED ORDER — ATORVASTATIN CALCIUM 80 MG PO TABS: 80.0000 mg | ORAL_TABLET | Freq: Every day | ORAL | Status: DC

## 2014-02-15 MED ORDER — MIDAZOLAM HCL 5 MG/ML IJ SOLN
INTRAMUSCULAR | Status: AC
  Filled 2014-02-15: qty 2

## 2014-02-15 MED ORDER — SODIUM CHLORIDE 0.9 % IJ SOLN
10.0000 mL | INTRAMUSCULAR | Status: DC | PRN
  Administered 2014-02-15 – 2014-03-03 (×17): 10 mL
  Administered 2014-03-04: 20 mL
  Administered 2014-03-04 – 2014-03-08 (×4): 10 mL
  Filled 2014-02-15 (×21): qty 40

## 2014-02-15 MED ORDER — PROCHLORPERAZINE 25 MG RE SUPP: 12.5000 mg | Freq: Four times a day (QID) | RECTAL | Status: DC | PRN

## 2014-02-15 MED ORDER — PROCHLORPERAZINE MALEATE 5 MG PO TABS
5.0000 mg | ORAL_TABLET | Freq: Four times a day (QID) | ORAL | Status: DC | PRN
  Filled 2014-02-15: qty 2

## 2014-02-15 MED ORDER — CLOPIDOGREL BISULFATE 75 MG PO TABS
75.0000 mg | ORAL_TABLET | Freq: Every day | ORAL | Status: DC
  Administered 2014-02-16 – 2014-03-15 (×28): 75 mg via ORAL
  Filled 2014-02-15 (×32): qty 1

## 2014-02-15 MED ORDER — PANTOPRAZOLE SODIUM 40 MG PO TBEC: 40.0000 mg | DELAYED_RELEASE_TABLET | Freq: Every day | ORAL | Status: DC

## 2014-02-15 MED ORDER — ACETAMINOPHEN 325 MG PO TABS
325.0000 mg | ORAL_TABLET | ORAL | Status: DC | PRN
  Administered 2014-02-20 – 2014-03-03 (×7): 650 mg via ORAL
  Filled 2014-02-15 (×7): qty 2

## 2014-02-15 NOTE — Consult Note (Addendum)
ELECTROPHYSIOLOGY CONSULT NOTE  Patient ID: Gregory Wiley MRN: [id number], DOB/AGE: 08/16/1950   Admit date: 02/12/2014 Date of Consult: 02/15/2014  Primary Physician: Tamsen Roers, MD Primary Cardiologist: Claiborne Billings Reason for Consultation: Cryptogenic stroke; recommendations regarding Implantable Loop Recorder  History of Present Illness Gregory Wiley was admitted on 02/12/2014 with left hemiparesis and dysarthria.  Imaging demonstrated acute right MCA territory infarct involving right temporal lobe, insula, and basal ganglia.  He has undergone workup for stroke including echocardiogram and carotid dopplers.  The patient has been monitored on telemetry which has demonstrated sinus rhythm with no arrhythmias.  Inpatient stroke work-up is to be completed with a TEE.   Echocardiogram this admission demonstrated EF 45%, hypokinesis of mid-apicalanteroseptal myocardium, grade 2 diastolic dysfunction, LA 54.  Lab work is reviewed.  Past medical history is significant for ICM, CAD s/p CABG, sleep apnea, hyperlipidemia, hypertension, and morbid obesity. He has had recent work up for melena at which time ulcer was diagnosed.  GI felt that Plavix and ASA could be resumed.    Prior to admission, the patient denies chest pain, shortness of breath, dizziness, palpitations, or syncope.  They are recovering from their stroke with plans to participate in inpatient rehab.   EP has been asked to evaluate for placement of an implantable loop recorder to monitor for atrial fibrillation.  ROS is negative except as outlined above.    Past Medical History  Diagnosis Date  . Coronary artery disease     stent, then CABG 07/20/10  . MI (myocardial infarction)     NSTEMI- last cath 06/2011-Stent to LCX-DES, last nuc 07/30/11 low risk  . Diabetes mellitus   . Hypertension   . Hyperlipidemia   . Obesity (BMI 30-39.9)   . OSA on CPAP     since July 2013  . H/O cardiomyopathy     ischemic, now last echo 07/30/11,  EF 38%W  . Diverticulosis   . Internal hemorrhoids   . Tubular adenoma of colon   . Plantar fasciitis of left foot      Surgical History:  Past Surgical History  Procedure Laterality Date  . Coronary artery bypass graft  07/20/2010     LIMA-LAD; VG-ACUTE MARG of RCA; Seq VG-distal RCA & then pda  . Coronary angioplasty with stent placement  07/01/2011    DES-Resolute to native LCX  . Coronary angioplasty with stent placement  12/01/2007    COMPLEX 5 LESION PCI INCLUDING CUTTING BALLOON AND 4 CYPHER DESs  . Hernia repair  2006  . Knee surgery Right   . Hand surgery      to take glass out  . Colonoscopy N/A 02/10/2013    Procedure: COLONOSCOPY;  Surgeon: Ladene Artist, MD;  Location: [facility];  Service: Endoscopy;  Laterality: N/A;  . Retinal detachment surgery       Prescriptions prior to admission  Medication Sig Dispense Refill Last Dose  . aspirin 81 MG tablet Take 81 mg by mouth daily.   Past Week at Unknown time  . carvedilol (COREG) 6.25 MG tablet Take 1 tablet (6.25 mg total) by mouth 2 (two) times daily. 180 tablet 3 Past Week at 0800  . clopidogrel (PLAVIX) 75 MG tablet Take 75 mg by mouth daily.   Past Week at Unknown time  . furosemide (LASIX) 40 MG tablet Take 20 mg by mouth daily as needed for fluid.    Past Week at Unknown time  . isosorbide mononitrate (IMDUR) 30 MG 24 hr  tablet Take 0.5 tablets (15 mg total) by mouth daily. 45 tablet 1 Past Week at Unknown time  . nitroGLYCERIN (NITROSTAT) 0.4 MG SL tablet Place 0.4 mg under the tongue every 5 (five) minutes as needed. For chest pain   unknown at unknown  . omeprazole (PRILOSEC) 20 MG capsule Take 20 mg by mouth 2 (two) times daily.   Past Week at Unknown time  . ranolazine (RANEXA) 500 MG 12 hr tablet Take 1 tablet (500 mg total) by mouth 2 (two) times daily. 180 tablet 1 Past Week at Unknown time  . simvastatin (ZOCOR) 40 MG tablet Take 40 mg by mouth every evening.   Past Week at Unknown time  . Vitamin D,  Ergocalciferol, (DRISDOL) 50000 UNITS CAPS capsule Take 50,000 Units by mouth every 7 (seven) days. Take on Mondays   Past Week at Unknown time    Inpatient Medications:  . aspirin EC  81 mg Oral Daily  . atorvastatin  80 mg Oral q1800  . clopidogrel  75 mg Oral Daily  . enoxaparin (LOVENOX) injection  30 mg Subcutaneous Q12H  . pantoprazole  40 mg Oral Daily    Allergies:  Allergies  Allergen Reactions  . Diphenhydramine Hcl Anaphylaxis    History   Social History  . Marital Status: Married    Spouse Name: N/A    Number of Children: 14  . Years of Education: N/A   Occupational History  .      works at the [employer] Topics  . Smoking status: Former Research scientist (life sciences)  . Smokeless tobacco: Never Used  . Alcohol Use: Yes     Comment: 1-2 times per year  . Drug Use: No  . Sexual Activity: Not on file   Other Topics Concern  . Not on file   Social History Narrative     Family History  Problem Relation Age of Onset  . Diabetes type II Mother   . Coronary artery disease    . Colon cancer Neg Hx   . CAD Father 57    CABG, PCI, died at 69  . Heart attack Father 42  . Hypertension Brother   . Diabetes Brother   . Hyperlipidemia Brother   . Brain cancer Maternal Grandmother   . COPD Paternal Grandfather      Physical Exam: Filed Vitals:   02/14/14 1857 02/14/14 2146 02/15/14 0235 02/15/14 0613  BP: 131/59 145/70 140/68 137/64  Pulse: 64 58 66 63  Temp: 98.7 F (37.1 C) 97.8 F (36.6 C) 98.2 F (36.8 C) 98.2 F (36.8 C)  TempSrc: Oral Oral Oral Oral  Resp: 20 18 18 18   Height:      Weight:      SpO2: 98% 96% 97% 93%    GEN- The patient is morbidly obese appearing, NAD  Head- normocephalic, atraumatic Eyes-  Sclera clear, conjunctiva pink Ears- hearing intact Oropharynx- clear Neck- supple Lungs- Clear to ausculation bilaterally, normal work of breathing Heart- Regular rate and rhythm, 1/6 SEM GI- obese, NT, ND, + BS Extremities- no  clubbing, cyanosis, or edema, 2+ DP/PT pulses MS- no significant deformity or atrophy Skin- no rash or lesion Psych- euthymic mood, full affect   Labs:   Lab Results  Component Value Date   WBC 9.3 02/15/2014   HGB 11.5* 02/15/2014   HCT 35.9* 02/15/2014   MCV 85.9 02/15/2014   PLT 180 02/15/2014    Recent Labs Lab 02/12/14 0950  02/15/14 0445  NA 140  < > 138  K 4.1  < > 3.7  CL 106  < > 101  CO2 21  < > 23  BUN 13  < > 11  CREATININE 1.09  < > 1.04  CALCIUM 8.5  < > 8.6  PROT 7.1  --   --   BILITOT 0.2*  --   --   ALKPHOS 78  --   --   ALT 11  --   --   AST 15  --   --   GLUCOSE 137*  < > 104*  < > = values in this interval not displayed.   Radiology/Studies: Ct Head Wo Contrast 02/12/2014   CLINICAL DATA:  Left-sided weakness.  Slurred speech.  EXAM: CT HEAD WITHOUT CONTRAST  TECHNIQUE: Contiguous axial images were obtained from the base of the skull through the vertex without contrast.  COMPARISON:  08/15/2010  FINDINGS: No evidence for acute hemorrhage, mass lesion, midline shift, hydrocephalus or large infarct. Diffuse mucosal thickening in the maxillary sinuses suggesting chronic changes. Postsurgical changes in the maxillary sinuses bilaterally. Diffuse mucosal disease in the ethmoid air cells. No acute bone abnormality. Subtle low density in the right frontal white matter appears unchanged.  IMPRESSION: No acute intracranial abnormality.  Paranasal sinus disease.   Electronically Signed   By: Markus Daft M.D.   On: 02/12/2014 10:13   [name] 02/13/2014   CLINICAL DATA:  Right MCA stroke.  Right M1 occlusion  EXAM: MRI HEAD AND MRA NECK WITH CONTRAST  TECHNIQUE: Multiplanar and multiecho pulse sequences of the neck were obtained with intravenous contrast. Angiographic images of the neck were obtained using MRA technique with intravenous contast.  CONTRAST:  7mL MULTIHANCE GADOBENATE DIMEGLUMINE 529 MG/ML IV SOLN  COMPARISON:  MRI 02/12/2014  FINDINGS:  Image quality degraded by mild motion.  3 vessel aortic arch. Proximal great vessels are patent. Signal loss in the proximal right subclavian artery likely is due to tortuosity.  Both vertebral arteries are patent to the basilar with antegrade flow. There is a severe focal stenosis of the distal left vertebral artery. The basilar is patent.  Right carotid bifurcation widely patent without significant stenosis or dissection. Cavernous carotid shows mild irregularity without significant stenosis. Abrupt occlusion of the right M1 segment as noted yesterday.  Left carotid bifurcation widely patent. Left cervical carotid patent. There is a moderate stenosis of the left internal carotid artery in the pre cavernous segment. This area was obscured by artifact yesterday.  IMPRESSION: Right carotid bifurcation is patent. Abrupt occlusion right M1 segment likely due to embolus.  Left carotid bifurcation is patent. Moderate stenosis of the pre cavernous internal carotid artery.  Severe stenosis distal left vertebral artery.   Electronically Signed   By: Franchot Gallo M.D.   On: 02/13/2014 13:36   [name] 02/12/2014  CLINICAL DATA:  Stroke  EXAM: MRI HEAD WITHOUT CONTRAST  MRA HEAD WITHOUT CONTRAST  TECHNIQUE: Multiplanar, multiecho pulse sequences of the brain and surrounding structures were obtained without intravenous contrast. Angiographic images of the head were obtained using MRA technique without contrast.  COMPARISON:  CT head 02/12/2014  FINDINGS: MRI HEAD FINDINGS  Acute infarct in the right MCA territory. Acute infarct involving the temporal tip and much to the temporal lobe. Acute infarct involves the posterior insula. There is acute infarct involving the right putamen and caudate. Negative for hemorrhage  Mild chronic microvascular ischemic changes in the white matter. Brainstem intact.  Ventricle size is normal.  No shift of the midline structures.  Negative for mass lesion.  Mucosal thickening in  the paranasal sinuses.  MRA HEAD FINDINGS  Image quality degraded by motion.  Atherosclerotic irregularity and mild to moderate disease in the distal vertebral artery bilaterally. The basilar is patent. Superior cerebellar and posterior cerebral arteries are patent. Atherosclerotic irregularity and stenosis in the distal posterior cerebral artery bilaterally.  Right internal carotid artery is patent with atherosclerotic irregularity in the cavernous segment. Right anterior cerebral artery widely patent. Abrupt occlusion of the right M1 segment which may be due to an embolus.  Left internal carotid artery is patent. Hypoplastic left A1 segment which is patent. Anterior and middle cerebral arteries are patent bilaterally.  Negative for cerebral aneurysm.  IMPRESSION: Acute infarct right MCA territory involving the right temporal lobe, insula, and basal ganglia. No hemorrhage or midline shift  Abrupt occlusion of the right M1 segment may represent embolus.  Electronically Signed: By: Franchot Gallo M.D. On: 02/12/2014 16:05   12-lead ECG sinus rhythm, rate 60, LAD, chronic TWI lead I and aVL  Telemetry sinus rhythm   Assessment and Plan:  1. Cryptogenic stroke The patient presents with cryptogenic stroke.  The patient has a TEE planned for this AM.  I spoke at length with the patient about monitoring for afib with either a 30 day event monitor or an implantable loop recorder.  Risks, benefits, and alteratives to implantable loop recorder were discussed with the patient today.   At this time, the patient is very clear in their decision to proceed with implantable loop recorder.   Wound care was reviewed with the patient (keep incision clean and dry for 3 days).  Wound check scheduled for 02-28-14 at 2:30PM [facility].   2.  CAD/ICM Resume beta blocker, ACE-I when stable from neuro standpoint  3.  Sleep apnea Continue CPAP  4.  Morbid obesity  Weight loss encouraged  Please call with  questions.

## 2014-02-15 NOTE — Progress Notes (Signed)
PMR Admission Coordinator Pre-Admission Assessment  Patient: Gregory Wiley is an 63 y.o., male MRN: [id number] DOB: 06-May-1950 Height: 6' (182.9 cm) Weight: (!) 158.759 kg (350 lb)  Insurance Information HMO: PPO: yes PCP: IPA: 80/20: OTHER: retired Psychiatric nurse PRIMARY: [place] Policy#: [id number] Subscriber: pt  CM Name: Geryl Rankins Phone#: [phone] Fax#: onsite Pre-Cert#: [id number] Employer: retired [employer] Benefits: Phone #: [phone] Name: 02/15/14 Eff. Date: 03/18/1981 Deduct: $320 Out of Pocket Max: $1700 included deductible  Life Max: none CIR: $100 copay per admit then covers 90% until OOP max met SNF: same as CIR Outpatient: $30 copay per visit Co-Pay: no visit limit Home Health: 90% Co-Pay: no visit limit DME: 90% Co-Pay: 10% Providers: in network  SECONDARY: none  Medicaid Application Date: Case Manager:  Disability Application Date: Case Worker:   Emergency Facilities manager Information    Name Relation Home Work [place] Spouse [phone]  [phone]   [place] Daughter   [phone]     Current Medical History  Patient Admitting Diagnosis: right MCA infarct with dense left hemiparesis and visual-spatial deficits  History of Present Illness:Gregory Wiley is a 63 y.o. male with history of CAD, DM type 2, HTN, OSA, esophagitis/duodenitis, who was admitted on 02/12/14 with left sided weakness with unsteady gait/fall, left facial droop and slurred speech. Patient has been off ASA/plavix due to recent bleeding ulcer. Family reports non-compliant with medications as well as CPAP use. Patient with progressive weakness in ED. CT head negative. MRI brain revealed acute  infarct right MCA territory involving the right temporal lobe, insula, and basal ganglia and abrupt occlusion right M1 segment question due to embolus. MRA head/neck revealed bilateral carotid bifurcation patent, moderate stenosis precavernous L-ICA and severe stenosis distal L-VA. 2D echo with EF 39%, grade 2 diastolic dysfunction and hypokinesis mid-apicalanteroseptal myocardium. Carotid dopplers with 1-39% ICA stenosis. BSS done and patient started on dysphagia 2, nectar liquids due to dysphagia. Neurology following for input and recommended resuming ASA and plavix for likely embolic stroke. Cognitive evaluation revealed moderate dysarthria with difficulty attending to task as well as left inattention.   Pt to have TEE today and loop recorder placement prior to d/c to inpt rehab.   Total: 16 NIH    Past Medical History  Past Medical History  Diagnosis Date  . Coronary artery disease     stent, then CABG 07/20/10  . MI (myocardial infarction)     NSTEMI- last cath 06/2011-Stent to LCX-DES, last nuc 07/30/11 low risk  . Diabetes mellitus   . Hypertension   . Hyperlipidemia   . Obesity (BMI 30-39.9)   . OSA on CPAP     since July 2013  . H/O cardiomyopathy     ischemic, now last echo 07/30/11, EF 38%W  . Diverticulosis   . Internal hemorrhoids   . Tubular adenoma of colon   . Plantar fasciitis of left foot     Family History  family history includes Brain cancer in his maternal grandmother; CAD (age of onset: 74) in his father; COPD in his paternal grandfather; Coronary artery disease in an other family member; Diabetes in his brother; Diabetes type II in his mother; Heart attack (age of onset: 18) in his father; Hyperlipidemia in his brother; Hypertension in his brother. There is no history of Colon cancer.  Prior Rehab/Hospitalizations: none  Current Medications  Current facility-administered medications: acetaminophen  (TYLENOL) suppository 650 mg, 650 mg, Rectal, Q6H PRN, 650 mg at 02/13/14 0602 **OR** acetaminophen (  TYLENOL) tablet 650 mg, 650 mg, Oral, Q6H PRN, Osa Craver, MD, 650 mg at 02/15/14 1041; aspirin EC tablet 81 mg, 81 mg, Oral, Daily, Alexa Richardson, MD, 81 mg at 02/15/14 1041; atorvastatin (LIPITOR) tablet 80 mg, 80 mg, Oral, q1800, Osa Craver, MD, 80 mg at 02/14/14 1807 clopidogrel (PLAVIX) tablet 75 mg, 75 mg, Oral, Daily, Alexa Richardson, MD, 75 mg at 02/15/14 1041; enoxaparin (LOVENOX) injection 30 mg, 30 mg, Subcutaneous, Q12H, Osa Craver, MD, Stopped at 02/15/14 1047; pantoprazole (PROTONIX) EC tablet 40 mg, 40 mg, Oral, Daily, Alexa Richardson, MD, 40 mg at 02/15/14 1041; sodium chloride 0.9 % injection 10-40 mL, 10-40 mL, Intracatheter, PRN, Annia Belt, MD, 30 mL at 02/14/14 1034  Patients Current Diet: Diet NPO time specified due to TEE 12/1. Dysphagia 2 diet with Nectar thick liquid  Precautions / Restrictions Precautions Precautions: Fall Precaution Comments: L neglect, decreased arousal, L hemiplegia, ? pushing to the L Restrictions Weight Bearing Restrictions: No   Prior Activity Level Community (5-7x/wk): I ptas. 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  Attention: Sustained Sustained Attention: Impaired Sustained Attention Impairment: Functional basic Awareness: Impaired Awareness Impairment: Emergent impairment Problem Solving: Impaired Problem Solving Impairment: Functional basic Behaviors: Impulsive Safety/Judgment: Impaired   Extremity Assessment (includes Sensation/Coordination)          ADLs  Overall ADL's : Needs assistance/impaired General ADL Comments: Progressed pt to standing and completed stand-pivot transfer to chair.    Mobility  Overal bed mobility: Needs Assistance, +2 for physical assistance Bed Mobility: Rolling, Sidelying to Sit Rolling: Max assist Sidelying to sit: Total assist, +2 for physical assistance General bed mobility comments: Pt requires total A verbal and sound cues for attending to L to locate rail. also requires [facility] assist for finding rail in order to roll to L side. Max A for rolling to L side and total A (+2) for lowering BLEs, maintaining trunk in hip and trunk flexion for weight shift over to the R hip for sitting position.     Transfers  Overall transfer level: Needs assistance Equipment used: 2 person hand held assist Transfers: Sit to/from Stand, Stand Pivot Transfers Sit to Stand: Max assist, +2 physical assistance Stand pivot transfers: Total assist, +2 physical assistance General transfer comment: Pt able to stand x 3 reps with max A (+2 for safety and forward weight shift as pt is impulsive with movement), Cues for hand placement, safety and increasing forward trunk lean for increased WB through LEs. Performed stand pivot transfer to recliner. Requires mod cues for  increased weight shift to the R for better ability to advance LLE, however still required physical assist to move LLE.     Ambulation /  Gait / Stairs / Emergency planning/management officer  Ambulation/Gait Ambulation/Gait assistance: +2 physical assistance, Total assist (+3 for chair follow) Ambulation Distance (Feet): 4 Feet Assistive device: (R handrail, PT under L axilla, OT in front, +3 chair follow) Gait Pattern/deviations: Step-to pattern, Decreased step length - left, Decreased stance time - left, Trunk flexed, Decreased weight shift to right, Wide base of support    Posture / Balance Dynamic Sitting Balance Sitting balance - Comments: Pt able to maintain balance at S level if using R UE for support, however if attempting any dynamic task, requires up to total A to prevent LOB to the L.     Special needs/care consideration BiPAP/CPAP yes used pta inconsistently Bowel mgmt: continent LBM 02/12/14 Bladder mgmt: catheter Diabetic mgmt yes   Previous Home Environment Living Arrangements: Spouse/significant other Lives With: Spouse Available Help at Discharge: Family, Available 24 hours/day, Other (Comment) (wife has MS) Type of Home: House Home Layout: One level Home Access: Stairs to enter Entrance Stairs-Rails: None Entrance Stairs-Number of Steps: 2 and small threshold Bathroom Shower/Tub: Tub/shower unit, Industrial/product designer: Yes How Accessible: Accessible via walker [place]: No Additional Comments: equipment is wife's as she has MS  Wife states they have a high bed that she has to use a stool to get into. She is to measure the height to let therapy staff know the barrier.  Discharge Living Setting Plans for Discharge Living Setting: Patient's home, Lives with (comment), Other (Comment) (wife) Type of Home at Discharge: House Discharge Home Layout: One level Discharge Home Access: Stairs to enter Entrance Stairs-Rails:  None Entrance Stairs-Number of Steps: 2 steps and a threshold Discharge Bathroom Shower/Tub: Tub/shower unit, Curtain Discharge Bathroom Toilet: Standard Discharge Bathroom Accessibility: Yes How Accessible: Accessible via walker Does the patient have any problems obtaining your medications?: No  Social/Family/Support Systems Patient Roles: Spouse, Parent, Other (Comment) (part time employee. 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, clinical Information: Archivist Anticipated Caregiver: wife and other family prn as they work Anticipated Ambulance person Information: see above Ability/Limitations of Caregiver: wife I without AD but has MS Caregiver Availability: 24/7 Discharge Plan Discussed with Primary Caregiver: Yes Is Caregiver In Agreement with Plan?: Yes Does Caregiver/Family have Issues with Lodging/Transportation while Pt is in Rehab?: No  Goals/Additional Needs Patient/Family Goal for Rehab: min assist PT, Min to mod assist OT, supervision SLP Expected length of stay: [facility] 20 to 25 days Equipment Needs: CPAP as pta. he was not using pta consistently Pt/Family Agrees to Admission and willing to participate: Yes Program Orientation Provided & Reviewed with Pt/Caregiver Including Roles & Responsibilities: Yes  Decrease burden of Care through IP rehab admission: I discussed with pt and wife on 02/15/14 at 62 the concept that if pt does not progress as needed to within the ELOS, that SNF rehab to continue his rehab recovery would be discussed by SW and team on inpt rehab. Pt is flaccid LUE and LLE and wt is 350 lbs. Wife has MS, other family members work. Wife open to further SNF rehab , but pt lacks the awareness of his deficits at this time. He is asking about when he can return to work.  Possible need for SNF placement upon discharge: as discussed above , there is a possible need for SNF after inpt rehab stay.  Patient Condition: This patient's condition remains as  documented in the consult dated 02/14/14, in which the Rehabilitation Physician determined and documented that the patient's condition is appropriate for  intensive rehabilitative care in an inpatient rehabilitation facility. Will admit to inpatient rehab today.  Preadmission Screen Completed By: Cleatrice Burke, 02/15/2014 1:36 PM ______________________________________________________________________  Discussed status with Dr. Naaman Plummer on 02/15/14 at 1358 and received telephone approval for admission today.  Admission Coordinator: Cleatrice Burke, time 7026 Date 02/15/14.          Cosigned by: Meredith Staggers, MD at 02/15/2014 2:10 PM  Revision History     Date/Time User Provider Type Action   02/15/2014 2:10 PM Meredith Staggers, MD Physician Cosign   02/15/2014 1:58 PM Cleatrice Burke, RN Rehab Admission Coordinator Sign

## 2014-02-15 NOTE — H&P (View-Only) (Signed)
Physical Medicine and Rehabilitation Admission H&P    Chief Complaint  Patient presents with  . Left sided weakness with sensory defiicis, right gaze preference due to Left HH and left facial weakness   HPI: Gregory Wiley is a 63 y.o. male with history of CAD, HTN, OSA, esophagitis/duodenitis, who was admitted on 02/12/14 with left sided weakness with unsteady gait/fall, left facial droop and slurred speech. Patient has been off ASA/plavix due to recent bleeding ulcer. Family reports non-compliant with medications as well as CPAP use.Patient with progressive weakness in ED. CT head negative. MRI brain revealed acute infarct right MCA territory involving the right temporal lobe, insula, and basal ganglia and abrupt occlusion right M1 segment question due to embolus. MRA head/neck revealed bilateral carotid bifurcation patent, moderate stenosis precavernous L-ICA and severe stenosis distal L-VA.  UDS negative.  2D echo with EF 28%, grade 2 diastolic dysfunction and hypokinesis mid-apicalanteroseptal myocardium. Carotid dopplers with 1-39% ICA stenosis. BSS done and patient started on dysphagia 2, nectar liquids due to dysphagia. Neurology following for input and recommended resuming ASA and plavix for likely embolic stroke.  Loop recorder placed today to rule out cryptogenic cause of stroke. Cognitive evaluation revealed moderate dysarthria with difficulty attending to task as well as left inattention. PT/OT evaluations done yesterday and CIR recommended by MD and rehab team. Patient admitted today for comprehensive rehab program.    Review of Systems  HENT: Negative for hearing loss.   Eyes: Negative for blurred vision and double vision.  Respiratory: Negative for cough and shortness of breath.   Cardiovascular: Positive for chest pain (chronic chest wall pain).  Gastrointestinal: Positive for heartburn, nausea and vomiting.  Genitourinary: Negative for urgency and frequency.    Musculoskeletal: Positive for myalgias, back pain and neck pain.  Neurological: Positive for sensory change, speech change, focal weakness and headaches. Negative for dizziness and tingling.  Psychiatric/Behavioral: The patient has insomnia.       Past Medical History  Diagnosis Date  . Coronary artery disease     stent, then CABG 07/20/10  . MI (myocardial infarction)     NSTEMI- last cath 06/2011-Stent to LCX-DES, last nuc 07/30/11 low risk  . Diabetes mellitus   . Hypertension   . Hyperlipidemia   . Obesity (BMI 30-39.9)   . OSA on CPAP     since July 2013  . H/O cardiomyopathy     ischemic, now last echo 07/30/11, EF 38%W  . Diverticulosis   . Internal hemorrhoids   . Tubular adenoma of colon   . Plantar fasciitis of left foot     Past Surgical History  Procedure Laterality Date  . Coronary artery bypass graft  07/20/2010     LIMA-LAD; VG-ACUTE MARG of RCA; Seq VG-distal RCA & then pda  . Coronary angioplasty with stent placement  07/01/2011    DES-Resolute to native LCX  . Coronary angioplasty with stent placement  12/01/2007    COMPLEX 5 LESION PCI INCLUDING CUTTING BALLOON AND 4 CYPHER DESs  . Hernia repair  2006  . Knee surgery Right   . Hand surgery      to take glass out  . Colonoscopy N/A 02/10/2013    Procedure: COLONOSCOPY;  Surgeon: Ladene Artist, MD;  Location: [facility];  Service: Endoscopy;  Laterality: N/A;  . Retinal detachment surgery      Family History  Problem Relation Age of Onset  . Diabetes type II Mother   . Coronary artery disease    .  Colon cancer Neg Hx   . CAD Father 59    CABG, PCI, died at 38  . Heart attack Father 57  . Hypertension Brother   . Diabetes Brother   . Hyperlipidemia Brother   . Brain cancer Maternal Grandmother   . COPD Paternal Grandfather     Social History: Married--wife disabled due to [place]. Independent and works part time driving cars for [employer]. He reports that he has quit smoking. He has never used  smokeless tobacco. He reports that he drinks mixed drink on rare occasions. He reports that he does not use illicit drugs.     Allergies  Allergen Reactions  . Diphenhydramine Hcl Anaphylaxis    Medications Prior to Admission  Medication Sig Dispense Refill  . aspirin 81 MG tablet Take 81 mg by mouth daily.    . carvedilol (COREG) 6.25 MG tablet Take 1 tablet (6.25 mg total) by mouth 2 (two) times daily. 180 tablet 3  . clopidogrel (PLAVIX) 75 MG tablet Take 75 mg by mouth daily.    . furosemide (LASIX) 40 MG tablet Take 20 mg by mouth daily as needed for fluid.     . isosorbide mononitrate (IMDUR) 30 MG 24 hr tablet Take 0.5 tablets (15 mg total) by mouth daily. 45 tablet 1  . nitroGLYCERIN (NITROSTAT) 0.4 MG SL tablet Place 0.4 mg under the tongue every 5 (five) minutes as needed. For chest pain    . omeprazole (PRILOSEC) 20 MG capsule Take 20 mg by mouth 2 (two) times daily.    . ranolazine (RANEXA) 500 MG 12 hr tablet Take 1 tablet (500 mg total) by mouth 2 (two) times daily. 180 tablet 1  . simvastatin (ZOCOR) 40 MG tablet Take 40 mg by mouth every evening.    . Vitamin D, Ergocalciferol, (DRISDOL) 50000 UNITS CAPS capsule Take 50,000 Units by mouth every 7 (seven) days. Take on Mondays      Home: [place] expects to be discharged to:: Private residence Living Arrangements: Spouse/significant other Available Help at Discharge: Family, Available 24 hours/day (wife available for 24/7 S, no assist, daughter available intermittently) Type of Home: House Home Access: Stairs to enter [employer] of Steps: 2 and small threshold Entrance Stairs-Rails: None Home Layout: One level Home Equipment: Cane - single point, Hand held shower head, Walker - 2 wheels, Wheelchair - manual Additional Comments: equipment is wife's as she has MS  Lives With: Spouse   Functional History: Prior Function Level of Independence: Independent  Functional Status:    Mobility: Bed Mobility Overal bed mobility: Needs Assistance, +2 for physical assistance Bed Mobility: Rolling, Sidelying to Sit Rolling: Max assist Sidelying to sit: Total assist, +2 for physical assistance General bed mobility comments: Pt requires total A verbal and sound cues for attending to L to locate rail.  also requires [facility] assist for finding rail in order to roll to L side.  Max A for rolling to L side and total A (+2) for lowering BLEs, maintaining trunk in hip and trunk flexion for weight shift over to the R hip for sitting position.   Transfers Overall transfer level: Needs assistance Equipment used: 2 person hand held assist Transfers: Sit to/from Stand, Stand Pivot Transfers Sit to Stand: Max assist, +2 physical assistance Stand pivot transfers: Total assist, +2 physical assistance General transfer comment: Pt able to stand x 3 reps with max A (+2 for safety and forward weight shift as pt is impulsive with movement),  Cues for hand  placement, safety and increasing forward trunk lean for increased WB through LEs.  Performed stand pivot transfer to recliner.  Requires mod cues for increased weight shift to the R for better ability to advance LLE, however still required physical assist to move LLE.   Ambulation/Gait Ambulation/Gait assistance: +2 physical assistance, Total assist (+3 for chair follow) Ambulation Distance (Feet): 4 Feet Assistive device:  (R handrail, PT under L axilla, OT in front, +3 chair follow) Gait Pattern/deviations: Step-to pattern, Decreased step length - left, Decreased stance time - left, Trunk flexed, Decreased weight shift to right, Wide base of support    ADL: ADL Overall ADL's : Needs assistance/impaired General ADL Comments: Progressed pt to standing and completed stand-pivot transfer to chair.  Cognition: Cognition Overall Cognitive Status: Impaired/Different from baseline Arousal/Alertness: Lethargic Orientation Level: Oriented  X4 Attention: Sustained Sustained Attention: Impaired Sustained Attention Impairment: Functional basic Awareness: Impaired Awareness Impairment: Emergent impairment Problem Solving: Impaired Problem Solving Impairment: Functional basic Behaviors: Impulsive Safety/Judgment: Impaired Cognition Arousal/Alertness: Lethargic Behavior During Therapy: Flat affect Overall Cognitive Status: Impaired/Different from baseline Area of Impairment: Attention, Following commands, Safety/judgement, Awareness Current Attention Level: Sustained Following Commands: Follows one step commands with increased time Safety/Judgement: Decreased awareness of safety, Decreased awareness of deficits Awareness: Emergent General Comments: Pt requires increased time and mod/max cues for carrying out single step commands during session.   Physical Exam: Blood pressure 137/64, pulse 63, temperature 98.2 F (36.8 C), temperature source Oral, resp. rate 18, height 6' (1.829 m), weight 158.759 kg (350 lb), SpO2 93 %. Physical Exam Nursing note and vitals reviewed. Constitutional: He is oriented to person, place, and time. He appears well-developed and well-nourished. He appears lethargic. He is easily aroused. Light sensitivity noted Morbidly obese male.  More alert. Large beard and white head of hair. HENT: dentition fair to poor. Oral mucosa pink and moist Head: Normocephalic and atraumatic.  Eyes: Conjunctivae are normal. Pupils are equal, round, and reactive to light.  Neck: Normal range of motion. Neck supple.  Cardiovascular: Normal rate and regular rhythm.  Respiratory: Effort normal. No respiratory distress. He has no wheezes. He has rhonchi in the right lower field. He has rales in the right lower field. He exhibits tenderness on chest wall.  GI: Soft. Bowel sounds are normal. He exhibits no distension. There is no tenderness.  Neurological: He is oriented to person, place, and time. More alert. Impaired  insight and awareness. Speech slurred.  Left inattention.  Able to follow occasional two step commands with cues. Left sided weakness with emerging tone. Some pain with left shoulder and left ankle with PROM. LUE: 0/5 prox to distal. LLE: 0/5 prox to distal. Fair trunk control in sitting position.  DTR's increased on left, toes up left foot.  Skin: Skin is warm and dry.  Psychiatric:  Flat, fatigued    Results for orders placed or performed during the hospital encounter of 02/12/14 (from the past 48 hour(s))  CBC     Status: Abnormal   Collection Time: 02/14/14  5:30 AM  Result Value Ref Range   WBC 8.8 4.0 - 10.5 K/uL   RBC 4.10 (L) 4.22 - 5.81 MIL/uL   Hemoglobin 11.7 (L) 13.0 - 17.0 g/dL   HCT 36.4 (L) 39.0 - 52.0 %   MCV 88.8 78.0 - 100.0 fL   MCH 28.5 26.0 - 34.0 pg   MCHC 32.1 30.0 - 36.0 g/dL   RDW 14.0 11.5 - 15.5 %   Platelets 171 150 - 400 K/uL  Basic metabolic panel     Status: Abnormal   Collection Time: 02/14/14  5:30 AM  Result Value Ref Range   Sodium 139 137 - 147 mEq/L   Potassium 3.8 3.7 - 5.3 mEq/L   Chloride 105 96 - 112 mEq/L   CO2 19 19 - 32 mEq/L   Glucose, Bld 105 (H) 70 - 99 mg/dL   BUN 11 6 - 23 mg/dL   Creatinine, Ser 1.08 0.50 - 1.35 mg/dL   Calcium 8.4 8.4 - 10.5 mg/dL   GFR calc non Af Amer 71 (L) >90 mL/min   GFR calc Af Amer 82 (L) >90 mL/min    Comment: (NOTE) The eGFR has been calculated using the CKD EPI equation. This calculation has not been validated in all clinical situations. eGFR's persistently <90 mL/min signify possible Chronic Kidney Disease.    Anion gap 15 5 - 15  CBC     Status: Abnormal   Collection Time: 02/15/14  4:45 AM  Result Value Ref Range   WBC 9.3 4.0 - 10.5 K/uL   RBC 4.18 (L) 4.22 - 5.81 MIL/uL   Hemoglobin 11.5 (L) 13.0 - 17.0 g/dL   HCT 35.9 (L) 39.0 - 52.0 %   MCV 85.9 78.0 - 100.0 fL   MCH 27.5 26.0 - 34.0 pg   MCHC 32.0 30.0 - 36.0 g/dL   RDW 13.7 11.5 - 15.5 %   Platelets 180 150 - 400 K/uL  Basic  metabolic panel     Status: Abnormal   Collection Time: 02/15/14  4:45 AM  Result Value Ref Range   Sodium 138 137 - 147 mEq/L   Potassium 3.7 3.7 - 5.3 mEq/L   Chloride 101 96 - 112 mEq/L   CO2 23 19 - 32 mEq/L   Glucose, Bld 104 (H) 70 - 99 mg/dL   BUN 11 6 - 23 mg/dL   Creatinine, Ser 1.04 0.50 - 1.35 mg/dL   Calcium 8.6 8.4 - 10.5 mg/dL   GFR calc non Af Amer 74 (L) >90 mL/min   GFR calc Af Amer 86 (L) >90 mL/min    Comment: (NOTE) The eGFR has been calculated using the CKD EPI equation. This calculation has not been validated in all clinical situations. eGFR's persistently <90 mL/min signify possible Chronic Kidney Disease.    Anion gap 14 5 - 15   [name]  02/13/2014   CLINICAL DATA:  Right MCA stroke.  Right M1 occlusion  EXAM: MRI HEAD AND MRA NECK WITH CONTRAST  TECHNIQUE: Multiplanar and multiecho pulse sequences of the neck were obtained with intravenous contrast. Angiographic images of the neck were obtained using MRA technique with intravenous contast.  CONTRAST:  22m MULTIHANCE GADOBENATE DIMEGLUMINE 529 MG/ML IV SOLN  COMPARISON:  MRI 02/12/2014  FINDINGS: Image quality degraded by mild motion.  3 vessel aortic arch. Proximal great vessels are patent. Signal loss in the proximal right subclavian artery likely is due to tortuosity.  Both vertebral arteries are patent to the basilar with antegrade flow. There is a severe focal stenosis of the distal left vertebral artery. The basilar is patent.  Right carotid bifurcation widely patent without significant stenosis or dissection. Cavernous carotid shows mild irregularity without significant stenosis. Abrupt occlusion of the right M1 segment as noted yesterday.  Left carotid bifurcation widely patent. Left cervical carotid patent. There is a moderate stenosis of the left internal carotid artery in the pre cavernous segment. This area was obscured by artifact yesterday.  IMPRESSION: Right carotid bifurcation is  patent. Abrupt occlusion right M1 segment likely due to embolus.  Left carotid bifurcation is patent. Moderate stenosis of the pre cavernous internal carotid artery.  Severe stenosis distal left vertebral artery.   Electronically Signed   By: Franchot Gallo M.D.   On: 02/13/2014 13:36       Medical Problem List and Plan: 1. Functional deficits secondary to Right MCA infarct with left hemiparesis, dysarthria, dysphagia,  and visual spatial deficits 2.  DVT Prophylaxis/Anticoagulation: Pharmaceutical: Lovenox 3. New onset Headaches/Pain Management: Continue tylenol prn for now. Treat left achilles pain with stretching and ice.  4. Mood: Poor frustration tolerance noted. LCSW to follow for evaluation and support.  5. Neuropsych: This patient is not capable of making decisions on his own behalf. 6. Skin/Wound Care:  Routine pressure relief measures.  7. Fluids/Electrolytes/Nutrition: Monitor I/O. Check follow up labs in am. Offer supplements as indicated if intake poor.  8. HTN: Will monitor every 8 hours. Currently controlled of medications (and to avoid hypotension). will resume medications as indicated. Continue  9.  H/o DM type 2?: Hgb A1c- 5.6 . Patient and wife deny any history of diabetes 10 OSA: Continue CPAP (has home machine) whenever asleep.  11. Recent esophagitis/duodenitis due to gastric ulcer: Protonix bid to help promote healing 12. CAD: Has chest wall pain due to poorly  Healed sternum. Continue ASA, plavix, lipitor--off imdur and coreg  13. ABLA: Check H/H in am.      Post Admission Physician Evaluation: 1. Functional deficits secondary  to embolic right MCA infarct. 2. Patient is admitted to receive collaborative, interdisciplinary care between the physiatrist, rehab nursing staff, and therapy team. 3. Patient's level of medical complexity and substantial therapy needs in context of that medical necessity cannot be provided at a lesser intensity of care such as a  SNF. 4. Patient has experienced substantial functional loss from his/her baseline which was documented above under the "Functional History" and "Functional Status" headings.  Judging by the patient's diagnosis, physical exam, and functional history, the patient has potential for functional progress which will result in measurable gains while on inpatient rehab.  These gains will be of substantial and practical use upon discharge  in facilitating mobility and self-care at the household level. 5. Physiatrist will provide 24 hour management of medical needs as well as oversight of the therapy plan/treatment and provide guidance as appropriate regarding the interaction of the two. 6. 24 hour rehab nursing will assist with bladder management, bowel management, safety, skin/wound care, disease management, medication administration, pain management and patient education  and help integrate therapy concepts, techniques,education, etc. 7. PT will assess and treat for/with: Lower extremity strength, range of motion, stamina, balance, functional mobility, safety, adaptive techniques and equipment, NMR, visual spatial and cognitive perceptual awareness, pain mgt, family ed.   Goals are: min assist. 8. OT will assess and treat for/with: ADL's, functional mobility, safety, upper extremity strength, adaptive techniques and equipment, NMR, visual spatial and cognitive perceptual awareness, family ed, stroke education, ego support.   Goals are: supervision to min/mod assist. Therapy may proceed with showering this patient. 9. SLP will assess and treat for/with: cognition, communication, swallowing.  Goals are: supervision to mod I. 10. Case Management and Social Worker will assess and treat for psychological issues and discharge planning. 11. Team conference will be held weekly to assess progress toward goals and to determine barriers to discharge. 12. Patient will receive at least 3 hours of therapy per day  at least 5 days  per week. 13. ELOS: 22-30 days       14. Prognosis:  good     Meredith Staggers, MD, [place] [facility] 02/15/2014   02/15/2014

## 2014-02-15 NOTE — Progress Notes (Signed)
Echocardiogram Echocardiogram Transesophageal has been performed.  Gregory Wiley 02/15/2014, 4:21 PM

## 2014-02-15 NOTE — Progress Notes (Signed)
SLP Cancellation Note  Patient Details Name: TREYSON AXEL MRN: [id number] DOB: 23-Feb-1951   Cancelled treatment:       Reason Eval/Treat Not Completed: Other (comment) (Patient NPO for procedure).  SLP will follow up as able.  Gunnar Fusi, M.A., CCC-SLP [phone]  [place] 02/15/2014, 11:25 AM

## 2014-02-15 NOTE — Progress Notes (Signed)
Pt is placed on CPAP with home settings [place]. RT will continue to monitor.

## 2014-02-15 NOTE — Progress Notes (Signed)
Physical Medicine and Rehabilitation Consult   Reason for Consult: Left sided weakness, left inattention and left facial weakness Referring Physician: Dr. Beryle Beams   HPI: Gregory Wiley is a 63 y.o. male with history of CAD, DM type 2, HTN, OSA, esophagitis/duodenitis, who was admitted on 02/12/14 with left sided weakness with unsteady gait/fall, left facial droop and slurred speech. Patient has been off ASA/plavix due to recent bleeding ulcer. Family reports non-compliant with medications as well as CPAP use. Patient with progressive weakness in ED. CT head negative. MRI brain revealed acute infarct right MCA territory involving the right temporal lobe, insula, and basal ganglia and abrupt occlusion right M1 segment question due to embolus. MRA head/neck revealed bilateral carotid bifurcation patent, moderate stenosis precavernous L-ICA and severe stenosis distal L-VA. 2D echo with EF 86%, grade 2 diastolic dysfunction and hypokinesis mid-apicalanteroseptal myocardium. Carotid dopplers with 1-39% ICA stenosis. BSS done and patient started on dysphagia 2, nectar liquids due to dysphagia. Neurology following for input and recommended resuming ASA and plavix for likely embolic stroke. Cognitive evaluation revealed moderate dysarthria with difficulty attending to task as well as left inattention. PT/OT evaluations pending. CIR recommended by MD and ST.    Review of Systems  HENT: Negative for hearing loss.  Eyes: Negative for blurred vision and double vision.  Respiratory: Positive for cough, shortness of breath and wheezing.  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 Negative for palpitations.  Gastrointestinal: Positive for nausea and constipation. Negative for blood in stool.  Genitourinary: Negative for dysuria and urgency.  Musculoskeletal: Positive for myalgias, back pain and neck pain.  Neurological: Positive for sensory  change, speech change, focal weakness and headaches. Negative for dizziness.  Psychiatric/Behavioral: The patient has insomnia.      Past Medical History  Diagnosis Date  . Coronary artery disease     stent, then CABG 07/20/10  . MI (myocardial infarction)     NSTEMI- last cath 06/2011-Stent to LCX-DES, last nuc 07/30/11 low risk  . Diabetes mellitus   . Hypertension   . Hyperlipidemia   . Obesity (BMI 30-39.9)   . OSA on CPAP     since July 2013  . H/O cardiomyopathy     ischemic, now last echo 07/30/11, EF 38%W  . Diverticulosis   . Internal hemorrhoids   . Tubular adenoma of colon   . Plantar fasciitis of left foot     Past Surgical History  Procedure Laterality Date  . Coronary artery bypass graft  07/20/2010     LIMA-LAD; VG-ACUTE MARG of RCA; Seq VG-distal RCA & then pda  . Coronary angioplasty with stent placement  07/01/2011    DES-Resolute to native LCX  . Coronary angioplasty with stent placement  12/01/2007    COMPLEX 5 LESION PCI INCLUDING CUTTING BALLOON AND 4 CYPHER DESs  . Hernia repair  2006  . Knee surgery Right   . Hand surgery      to take glass out  . Colonoscopy N/A 02/10/2013    Procedure: COLONOSCOPY; Surgeon: Ladene Artist, MD; Location: [facility]; Service: Endoscopy; Laterality: N/A;  . Retinal detachment surgery      Family History  Problem Relation Age of Onset  . Diabetes type II Mother   . Coronary artery disease    . Colon cancer Neg Hx   . CAD Father 27    CABG, PCI, died at 80  . Heart attack Father 55  . Hypertension Brother   .  Diabetes Brother   . Hyperlipidemia Brother   . Brain cancer Maternal Grandmother   . COPD Paternal Grandfather     Social History: Married--wife disabled due to [place]. Independent and works part time driving cars for [employer]. He reports that he has  quit smoking. He has never used smokeless tobacco. He reports that he drinks mixed drink on rare occasions. He reports that he does not use illicit drugs.    Allergies  Allergen Reactions  . Diphenhydramine Hcl Anaphylaxis    Medications Prior to Admission  Medication Sig Dispense Refill  . aspirin 81 MG tablet Take 81 mg by mouth daily.    . carvedilol (COREG) 6.25 MG tablet Take 1 tablet (6.25 mg total) by mouth 2 (two) times daily. 180 tablet 3  . clopidogrel (PLAVIX) 75 MG tablet Take 75 mg by mouth daily.    . furosemide (LASIX) 40 MG tablet Take 20 mg by mouth daily as needed for fluid.     . isosorbide mononitrate (IMDUR) 30 MG 24 hr tablet Take 0.5 tablets (15 mg total) by mouth daily. 45 tablet 1  . nitroGLYCERIN (NITROSTAT) 0.4 MG SL tablet Place 0.4 mg under the tongue every 5 (five) minutes as needed. For chest pain    . omeprazole (PRILOSEC) 20 MG capsule Take 20 mg by mouth 2 (two) times daily.    . ranolazine (RANEXA) 500 MG 12 hr tablet Take 1 tablet (500 mg total) by mouth 2 (two) times daily. 180 tablet 1  . simvastatin (ZOCOR) 40 MG tablet Take 40 mg by mouth every evening.    . Vitamin D, Ergocalciferol, (DRISDOL) 50000 UNITS CAPS capsule Take 50,000 Units by mouth every 7 (seven) days. Take on Mondays      Home: [place] expects to be discharged to:: Unsure Living Arrangements: Spouse/significant other, Children Lives With: Spouse  Functional History:   Functional Status:  Mobility:          ADL:    Cognition: Cognition Overall Cognitive Status: Impaired/Different from baseline Arousal/Alertness: Lethargic Orientation Level: Oriented X4 Attention: Sustained Sustained Attention: Impaired Sustained Attention Impairment: Functional basic Awareness: Impaired Awareness Impairment: Emergent impairment Problem Solving: Impaired Problem Solving Impairment: Functional  basic Behaviors: Impulsive Safety/Judgment: Impaired Cognition Overall Cognitive Status: Impaired/Different from baseline  Blood pressure 124/57, pulse 61, temperature 98.2 F (36.8 C), temperature source Oral, resp. rate 18, height 6' (1.829 m), weight 158.759 kg (350 lb), SpO2 93 %. Physical Exam  Nursing note and vitals reviewed. Constitutional: He is oriented to person, place, and time. He appears well-developed and well-nourished. He appears lethargic. He is easily aroused.  Morbidly obese male--sleeping soundly with CPAP in place. Needed tactile cues to awaken.  HENT:  Head: Normocephalic and atraumatic.  Eyes: Conjunctivae are normal. Pupils are equal, round, and reactive to light.  Neck: Normal range of motion. Neck supple.  Cardiovascular: Normal rate and regular rhythm.  Respiratory: Effort normal. No respiratory distress. He has no wheezes. He has rhonchi in the right lower field. He has rales in the right lower field. He exhibits tenderness.  GI: Soft. Bowel sounds are normal. He exhibits no distension. There is no tenderness.  Neurological: He is oriented to person, place, and time and easily aroused. He appears lethargic.  Lethargic and had difficulty staying awake. Eggs noted in mouth despite wife reporting clearing of pocketed breakfast. Right gaze preference with inability to move eyes to left field. L-HH with left inattention. Decreased awareness of deficits. Able to follow occasional two  step commands with cues. Left sided weakness with emerging tone and pain Left shoulder and left ankle with attempts at minimal movement. LUE: 0/5 prox to distal. LLE: 0/5 prox to distal. DTR's increased on left, toes up left foot.  Skin: Skin is warm and dry.  Psychiatric:  Flat, fatigued     Lab Results Last 24 Hours    Results for orders placed or performed during the hospital encounter of 02/12/14 (from the past 24 hour(s))  CBC Status: Abnormal   Collection Time:  02/14/14 5:30 AM  Result Value Ref Range   WBC 8.8 4.0 - 10.5 K/uL   RBC 4.10 (L) 4.22 - 5.81 MIL/uL   Hemoglobin 11.7 (L) 13.0 - 17.0 g/dL   HCT 36.4 (L) 39.0 - 52.0 %   MCV 88.8 78.0 - 100.0 fL   MCH 28.5 26.0 - 34.0 pg   MCHC 32.1 30.0 - 36.0 g/dL   RDW 14.0 11.5 - 15.5 %   Platelets 171 150 - 400 K/uL  Basic metabolic panel Status: Abnormal   Collection Time: 02/14/14 5:30 AM  Result Value Ref Range   Sodium 139 137 - 147 mEq/L   Potassium 3.8 3.7 - 5.3 mEq/L   Chloride 105 96 - 112 mEq/L   CO2 19 19 - 32 mEq/L   Glucose, Bld 105 (H) 70 - 99 mg/dL   BUN 11 6 - 23 mg/dL   Creatinine, Ser 1.08 0.50 - 1.35 mg/dL   Calcium 8.4 8.4 - 10.5 mg/dL   GFR calc non Af Amer 71 (L) >90 mL/min   GFR calc Af Amer 82 (L) >90 mL/min   Anion gap 15 5 - 15     Ct Head Wo Contrast  02/12/2014 CLINICAL DATA: Left-sided weakness. Slurred speech. EXAM: CT HEAD WITHOUT CONTRAST TECHNIQUE: Contiguous axial images were obtained from the base of the skull through the vertex without contrast. COMPARISON: 08/15/2010 FINDINGS: No evidence for acute hemorrhage, mass lesion, midline shift, hydrocephalus or large infarct. Diffuse mucosal thickening in the maxillary sinuses suggesting chronic changes. Postsurgical changes in the maxillary sinuses bilaterally. Diffuse mucosal disease in the ethmoid air cells. No acute bone abnormality. Subtle low density in the right frontal white matter appears unchanged. IMPRESSION: No acute intracranial abnormality. Paranasal sinus disease. Electronically Signed By: Markus Daft M.D. On: 02/12/2014 10:13   [name]  02/13/2014 CLINICAL DATA: Right MCA stroke. Right M1 occlusion EXAM: MRI HEAD AND MRA NECK WITH CONTRAST TECHNIQUE: Multiplanar and multiecho pulse sequences of the neck were obtained with intravenous contrast. Angiographic images  of the neck were obtained using MRA technique with intravenous contast. CONTRAST: 39mL MULTIHANCE GADOBENATE DIMEGLUMINE 529 MG/ML IV SOLN COMPARISON: MRI 02/12/2014 FINDINGS: Image quality degraded by mild motion. 3 vessel aortic arch. Proximal great vessels are patent. Signal loss in the proximal right subclavian artery likely is due to tortuosity. Both vertebral arteries are patent to the basilar with antegrade flow. There is a severe focal stenosis of the distal left vertebral artery. The basilar is patent. Right carotid bifurcation widely patent without significant stenosis or dissection. Cavernous carotid shows mild irregularity without significant stenosis. Abrupt occlusion of the right M1 segment as noted yesterday. Left carotid bifurcation widely patent. Left cervical carotid patent. There is a moderate stenosis of the left internal carotid artery in the pre cavernous segment. This area was obscured by artifact yesterday. IMPRESSION: Right carotid bifurcation is patent. Abrupt occlusion right M1 segment likely due to embolus. Left carotid bifurcation is patent. Moderate  stenosis of the pre cavernous internal carotid artery. Severe stenosis distal left vertebral artery. Electronically Signed By: Franchot Gallo M.D. On: 02/13/2014 13:36   [name]  02/12/2014 ADDENDUM REPORT: 02/12/2014 16:16 ADDENDUM: These results were called by telephone at the time of interpretation on 02/12/2014 at 4:16 pm to Dr. Ronnald Ramp , who verbally acknowledged these results. Electronically Signed By: Franchot Gallo M.D. On: 02/12/2014 16:16   02/12/2014 CLINICAL DATA: Stroke EXAM: MRI HEAD WITHOUT CONTRAST MRA HEAD WITHOUT CONTRAST TECHNIQUE: Multiplanar, multiecho pulse sequences of the brain and surrounding structures were obtained without intravenous contrast. Angiographic images of the head were obtained using MRA technique without contrast. COMPARISON: CT head 02/12/2014  FINDINGS: MRI HEAD FINDINGS Acute infarct in the right MCA territory. Acute infarct involving the temporal tip and much to the temporal lobe. Acute infarct involves the posterior insula. There is acute infarct involving the right putamen and caudate. Negative for hemorrhage Mild chronic microvascular ischemic changes in the white matter. Brainstem intact. Ventricle size is normal. No shift of the midline structures. Negative for mass lesion. Mucosal thickening in the paranasal sinuses. MRA HEAD FINDINGS Image quality degraded by motion. Atherosclerotic irregularity and mild to moderate disease in the distal vertebral artery bilaterally. The basilar is patent. Superior cerebellar and posterior cerebral arteries are patent. Atherosclerotic irregularity and stenosis in the distal posterior cerebral artery bilaterally. Right internal carotid artery is patent with atherosclerotic irregularity in the cavernous segment. Right anterior cerebral artery widely patent. Abrupt occlusion of the right M1 segment which may be due to an embolus. Left internal carotid artery is patent. Hypoplastic left A1 segment which is patent. Anterior and middle cerebral arteries are patent bilaterally. Negative for cerebral aneurysm. IMPRESSION: Acute infarct right MCA territory involving the right temporal lobe, insula, and basal ganglia. No hemorrhage or midline shift Abrupt occlusion of the right M1 segment may represent embolus. Electronically Signed: By: Franchot Gallo M.D. 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 Does the need for close, 24 hr/day medical supervision in concert with the patient's rehab needs make it unreasonable for this patient to be served in a less intensive setting? Yes 2. Co-Morbidities requiring supervision/potential complications: htn, post-stroke sequelae 3. Due to bladder management, bowel management,  safety, skin/wound care, disease management, medication administration, pain management and patient education, does the patient require 24 hr/day rehab nursing? Yes 4. Does the patient require coordinated care of a physician, rehab nurse, PT (1-2 hrs/day, 5 days/week), OT (1-2 hrs/day, 5 days/week) and SLP (1-2 hrs/day, 5 days/week) to address physical and functional deficits in the context of the above medical diagnosis(es)? Yes Addressing deficits in the following areas: balance, endurance, locomotion, strength, transferring, bowel/bladder control, bathing, dressing, feeding, grooming, toileting, cognition, speech, swallowing and psychosocial support 5. Can the patient actively participate in an intensive therapy program of at least 3 hrs of therapy per day at least 5 days per week? Yes 6. The potential for patient to make measurable gains while on inpatient rehab is excellent 7. Anticipated functional outcomes upon discharge from inpatient rehab are min assist with PT, min assist and mod assist with OT, supervision with SLP. 8. Estimated rehab length of stay to reach the above functional goals is: 20-25 days 9. Does the patient have adequate social supports and living environment to accommodate these discharge functional goals? Yes 10. Anticipated D/C setting: Home 11. Anticipated post D/C treatments: HH therapy and Outpatient therapy 12. Overall  Rehab/Functional Prognosis: good  RECOMMENDATIONS: This patient's condition is appropriate for continued rehabilitative care in the following setting: CIR Patient has agreed to participate in recommended program. Yes Note that insurance prior authorization may be required for reimbursement for recommended care.  Comment: Rehab Admissions Coordinator to follow up.  Thanks,  Meredith Staggers, MD, [facility]     02/14/2014       Revision History     Date/Time User Provider Type Action   02/14/2014 10:07 AM Meredith Staggers, MD Physician  Sign   02/14/2014 9:58 AM Bary Leriche, PA-C Physician Assistant Share   View Details Report       Routing History     Date/Time From To Method   02/14/2014 10:07 AM Meredith Staggers, MD Meredith Staggers, MD In Basket   02/14/2014 10:07 AM Meredith Staggers, MD Tamsen Roers, MD Fax

## 2014-02-15 NOTE — PMR Pre-admission (Signed)
PMR Admission Coordinator Pre-Admission Assessment  Patient: Gregory Wiley is an 63 y.o., male MRN: [id number] DOB: [birth year] Height: 6' (182.9 cm) Weight: (!) 158.759 kg (350 lb)              Insurance Information HMO:     PPO: yes     PCP:      IPA:      80/20:      OTHER: retired Psychiatric nurse PRIMARY: [place]      Policy#: [id number]      Subscriber: pt  CM Name: Geryl Rankins      Phone#: [phone]     Fax#: onsite Pre-Cert#: [id number]      Employer: retired [employer] Benefits:  Phone #: [phone]     Name: 02/15/14 Eff. Date: 03/18/1981     Deduct: $320      Out of Pocket Max: $1700 included deductible      Life Max: none CIR: $100 copay per admit then covers 90% until OOP max met      SNF: same as CIR Outpatient: $30 copay per visit     Co-Pay: no visit limit Home Health: 90%      Co-Pay: no visit limit DME: 90%     Co-Pay: 10% Providers: in network  SECONDARY: none       Medicaid Application Date:       Case Manager:  Disability Application Date:       Case Worker:   Emergency Facilities manager Information    Name Relation Home Work [place] Spouse [phone]  [phone]   [place] Daughter   [phone]     Current Medical History  Patient Admitting Diagnosis: 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 male with history of CAD, DM type 2, HTN, OSA, esophagitis/duodenitis, who was admitted on 02/12/14 with left sided weakness with unsteady gait/fall, left facial droop and slurred speech. Patient has been off ASA/plavix due to recent bleeding ulcer. Family reports non-compliant with medications as well as CPAP use. Patient with progressive weakness in ED. CT head negative. MRI brain revealed acute infarct right MCA territory involving the right temporal lobe, insula, and basal ganglia and abrupt occlusion right M1 segment question due to embolus.  MRA head/neck revealed bilateral carotid bifurcation patent, moderate stenosis precavernous L-ICA and severe stenosis distal L-VA. 2D echo with EF 20%, grade 2 diastolic dysfunction and hypokinesis mid-apicalanteroseptal myocardium. Carotid dopplers with 1-39% ICA stenosis. BSS done and patient started on dysphagia 2, nectar liquids due to dysphagia. Neurology following for input and recommended resuming ASA and plavix for likely embolic stroke. Cognitive evaluation revealed moderate dysarthria with difficulty attending to task as well as left inattention.   Pt to have TEE today and loop recorder placement prior to d/c to inpt rehab.   Total: 16 NIH    Past Medical History  Past Medical History  Diagnosis Date  . Coronary artery disease     stent, then CABG 07/20/10  . MI (myocardial infarction)     NSTEMI- last cath 06/2011-Stent to LCX-DES, last nuc 07/30/11 low risk  . Diabetes mellitus   . Hypertension   . Hyperlipidemia   . Obesity (BMI 30-39.9)   . OSA on CPAP     since July 2013  . H/O cardiomyopathy     ischemic, now last echo 07/30/11, EF 38%W  . Diverticulosis   . Internal hemorrhoids   .  Tubular adenoma of colon   . Plantar fasciitis of left foot     Family History  family history includes Brain cancer in his maternal grandmother; CAD (age of onset: 39) in his father; COPD in his paternal grandfather; Coronary artery disease in an other family member; Diabetes in his brother; Diabetes type II in his mother; Heart attack (age of onset: 29) in his father; Hyperlipidemia in his brother; Hypertension in his brother. There is no history of Colon cancer.  Prior Rehab/Hospitalizations: none   Current Medications  Current facility-administered medications: acetaminophen (TYLENOL) suppository 650 mg, 650 mg, Rectal, Q6H PRN, 650 mg at 02/13/14 0602 **OR** acetaminophen (TYLENOL) tablet 650 mg, 650 mg, Oral, Q6H PRN, Osa Craver, MD, 650 mg at 02/15/14 1041;  aspirin EC tablet  81 mg, 81 mg, Oral, Daily, Alexa Richardson, MD, 81 mg at 02/15/14 1041;  atorvastatin (LIPITOR) tablet 80 mg, 80 mg, Oral, q1800, Osa Craver, MD, 80 mg at 02/14/14 1807 clopidogrel (PLAVIX) tablet 75 mg, 75 mg, Oral, Daily, Alexa Richardson, MD, 75 mg at 02/15/14 1041;  enoxaparin (LOVENOX) injection 30 mg, 30 mg, Subcutaneous, Q12H, Osa Craver, MD, Stopped at 02/15/14 1047;  pantoprazole (PROTONIX) EC tablet 40 mg, 40 mg, Oral, Daily, Alexa Richardson, MD, 40 mg at 02/15/14 1041;  sodium chloride 0.9 % injection 10-40 mL, 10-40 mL, Intracatheter, PRN, Annia Belt, MD, 30 mL at 02/14/14 1034  Patients Current Diet: Diet NPO time specified due to TEE 12/1. Dysphagia 2 diet with Nectar thick liquid  Precautions / Restrictions Precautions Precautions: Fall Precaution Comments: L neglect, decreased arousal, L hemiplegia, ? pushing to the L Restrictions Weight Bearing Restrictions: No   Prior Activity Level Community (5-7x/wk): I ptas. 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  Attention: Sustained Sustained Attention: Impaired Sustained Attention Impairment: Functional basic Awareness: Impaired Awareness Impairment: Emergent impairment Problem Solving: Impaired Problem Solving Impairment: Functional basic Behaviors: Impulsive Safety/Judgment: Impaired    Extremity Assessment (includes Sensation/Coordination)          ADLs  Overall ADL's : Needs assistance/impaired General ADL Comments: Progressed pt to standing and completed stand-pivot transfer to chair.    Mobility  Overal bed mobility: Needs Assistance, +2 for physical assistance Bed Mobility: Rolling, Sidelying to Sit Rolling: Max assist Sidelying to sit: Total assist, +2 for physical assistance General bed mobility comments: Pt requires total A verbal and sound cues for attending to L to locate rail.  also requires [place] assist for finding rail in order to roll to L side.  Max A for rolling to L side and total A (+2) for lowering BLEs, maintaining trunk in hip and trunk flexion for weight shift over to the R hip for  sitting position.      Transfers  Overall transfer level: Needs assistance Equipment used: 2 person hand held assist Transfers: Sit to/from Stand, Stand Pivot Transfers Sit to Stand: Max assist, +2 physical assistance Stand pivot transfers: Total assist, +2 physical assistance General transfer comment: Pt able to stand x 3 reps with max A (+2 for safety and forward weight shift as pt is impulsive with movement),  Cues for hand placement, safety and increasing forward trunk lean for increased WB through LEs.  Performed stand pivot transfer to recliner.  Requires mod cues for increased weight shift to the R for better ability to advance LLE, however still required physical assist to move LLE.      Ambulation / Gait / Stairs / Wheelchair Mobility  Ambulation/Gait Ambulation/Gait assistance: +2 physical  assistance, Total assist (+3 for chair follow) Ambulation Distance (Feet): 4 Feet Assistive device:  (R handrail, PT under L axilla, OT in front, +3 chair follow) Gait Pattern/deviations: Step-to pattern, Decreased step length - left, Decreased stance time - left, Trunk flexed, Decreased weight shift to right, Wide base of support    Posture / Balance Dynamic Sitting Balance Sitting balance - Comments: Pt able to maintain balance at S level if using R UE for support, however if attempting any dynamic task, requires up to total A to prevent LOB to the L.     Special needs/care consideration BiPAP/CPAP yes used pta inconsistently Bowel mgmt: continent LBM 02/12/14 Bladder mgmt: catheter Diabetic mgmt yes   Previous Home Environment Living Arrangements: Spouse/significant other  Lives With: Spouse Available Help at Discharge: Family, Available 24 hours/day, Other (Comment) (wife has MS) Type of Home: House Home Layout: One level Home Access: Stairs to enter Entrance Stairs-Rails: None Entrance Stairs-Number of Steps: 2 and small threshold Bathroom Shower/Tub: Tub/shower unit, Industrial/product designer: Yes How Accessible: Accessible via walker [place]: No Additional Comments: equipment is wife's as she has MS  Wife states they have a high bed that she has to use a stool to get into. She is to measure the height to let therapy staff know the barrier.  Discharge Living Setting Plans for Discharge Living Setting: Patient's home, Lives with (comment), Other (Comment) (wife) Type of Home at Discharge: House Discharge Home Layout: One level Discharge Home Access: Stairs to enter Entrance Stairs-Rails: None Entrance Stairs-Number of Steps: 2 steps and a threshold Discharge Bathroom Shower/Tub: Tub/shower unit, Curtain Discharge Bathroom Toilet: Standard Discharge Bathroom Accessibility: Yes How Accessible: Accessible via walker Does the patient  have any problems obtaining your medications?: No  Social/Family/Support Systems Patient Roles: Spouse, Parent, Other (Comment) (part time employee. 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, clinical Information: Archivist Anticipated Caregiver: wife and other family prn as they work Anticipated Ambulance person Information: see above Ability/Limitations of Caregiver: wife I without AD but has MS Caregiver Availability: 24/7 Discharge Plan Discussed with Primary Caregiver: Yes Is Caregiver In Agreement with Plan?: Yes Does Caregiver/Family have Issues with Lodging/Transportation while Pt is in Rehab?: No  Goals/Additional Needs Patient/Family Goal for Rehab: min assist PT, Min to mod assist OT, supervision SLP Expected length of stay: [facility] 20 to 25 days Equipment Needs: CPAP as pta. he was not using pta consistently Pt/Family Agrees to Admission and willing to participate: Yes Program Orientation Provided & Reviewed with Pt/Caregiver Including Roles  & Responsibilities: Yes  Decrease burden of Care through IP rehab admission: I discussed with pt and wife on 02/15/14 at 1330 the concept  that if pt does not progress as needed to within the [facility], that SNF rehab to continue his rehab recovery would be discussed by SW and team on inpt rehab. Pt is flaccid LUE and LLE and wt is 350 lbs. Wife has MS, other family members work. Wife open to further SNF rehab , but pt lacks the awareness of his deficits at this time. He is asking about when he can return to work.  Possible need for SNF placement upon discharge: as  discussed above , there is a possible need for SNF after inpt rehab stay.  Patient Condition: This patient's condition remains as documented in the consult dated 02/14/14, in which the Rehabilitation Physician determined and documented that the patient's condition is appropriate for intensive rehabilitative care in an inpatient rehabilitation facility. Will admit to inpatient rehab  today.  Preadmission Screen Completed By:  Cleatrice Burke, 02/15/2014 1:36 PM ______________________________________________________________________   Discussed status with Dr. Naaman Plummer on 02/15/14 at  1358  and received telephone approval for admission today.  Admission Coordinator:  Cleatrice Burke, time 7408 Date 02/15/14.

## 2014-02-15 NOTE — Progress Notes (Cosign Needed)
Subjective: [name] feels well this morning. He has no headache and no new symptoms aside from dry mouth. He is eager to eat and is requesting a shake after his procedure this afternoon.   Objective: Vital signs in last 24 hours: Filed Vitals:   02/14/14 2146 02/15/14 0235 02/15/14 0613 02/15/14 1020  BP: 145/70 140/68 137/64 130/69  Pulse: 58 66 63 50  Temp: 97.8 F (36.6 C) 98.2 F (36.8 C) 98.2 F (36.8 C) 98.2 F (36.8 C)  TempSrc: Oral Oral Oral Oral  Resp: 18 18 18 18   Height:      Weight:      SpO2: 96% 97% 93% 93%   Weight change:   Intake/Output Summary (Last 24 hours) at 02/15/14 1408 Last data filed at 02/15/14 1401  Gross per 24 hour  Intake      0 ml  Output   1100 ml  Net  -1100 ml   Physical Exam; Appearance: obese man in NAD, family members at bedside HEENT: AT/[place], PERRL, right gaze preference but can gaze left Heart: RRR, normal S1S2, sternotomy scar Lungs: CTAB, no WRR Abdomen: BS+, soft, nontender, nondistended Musculoskeletal: full ROM Extremities: no LE edema Neurologic: A&Ox3, 0/5 strength in LUE and LLE, but otherwise full, sensation not intact to light touch in LUE, aphasia improved Skin: no lesions or rashes  Lab Results: Basic Metabolic Panel:  Recent Labs Lab 02/14/14 0530 02/15/14 0445  NA 139 138  K 3.8 3.7  CL 105 101  CO2 19 23  GLUCOSE 105* 104*  BUN 11 11  CREATININE 1.08 1.04  CALCIUM 8.4 8.6   Liver Function Tests:  Recent Labs Lab 02/12/14 0950  AST 15  ALT 11  ALKPHOS 78  BILITOT 0.2*  PROT 7.1  ALBUMIN 3.2*   CBC:  Recent Labs Lab 02/12/14 0950  02/14/14 0530 02/15/14 0445  WBC 6.7  --  8.8 9.3  NEUTROABS 3.5  --   --   --   HGB 12.0*  < > 11.7* 11.5*  HCT 37.5*  < > 36.4* 35.9*  MCV 86.6  --  88.8 85.9  PLT 197  --  171 180  < > = values in this interval not displayed.  Hemoglobin A1C:  Recent Labs Lab 02/13/14 0537  HGBA1C 5.6   Fasting Lipid Panel:  Recent Labs Lab  02/13/14 0537  CHOL 181  HDL 40  LDLCALC 120*  TRIG 105  CHOLHDL 4.5   Coagulation:  Recent Labs Lab 02/12/14 0950  LABPROT 13.7  INR 1.04   Urine Drug Screen: Drugs of Abuse     Component Value Date/Time   LABOPIA NONE DETECTED 02/12/2014 1900   COCAINSCRNUR NONE DETECTED 02/12/2014 1900   LABBENZ NONE DETECTED 02/12/2014 1900   AMPHETMU NONE DETECTED 02/12/2014 1900   THCU NONE DETECTED 02/12/2014 1900   LABBARB NONE DETECTED 02/12/2014 1900    Alcohol Level:  Recent Labs Lab 02/12/14 0950  ETH <11   Urinalysis:  Recent Labs Lab 02/12/14 1900  COLORURINE YELLOW  LABSPEC 1.015  PHURINE 8.0  GLUCOSEU NEGATIVE  HGBUR NEGATIVE  BILIRUBINUR NEGATIVE  KETONESUR NEGATIVE  PROTEINUR NEGATIVE  UROBILINOGEN 0.2  NITRITE NEGATIVE  LEUKOCYTESUR TRACE*   TEE results: Impression:  1) Mild LVE with septal hypokinesis EF 45% 2) Negative bubble study with no PFO 3) No LAA thrombus 4) Mild MR 5) AV sclerosis with no stenosis 6) Mild aortic root enlargement 3.9 cm 7) Normal RV 8) Mild TR 9) No pericardial  effusion   Medications: I have reviewed the patient's current medications. Scheduled Meds: . aspirin EC  81 mg Oral Daily  . atorvastatin  80 mg Oral q1800  . clopidogrel  75 mg Oral Daily  . enoxaparin (LOVENOX) injection  30 mg Subcutaneous Q12H  . pantoprazole  40 mg Oral Daily   Continuous Infusions:  PRN Meds:.acetaminophen **OR** acetaminophen, sodium chloride Assessment/Plan: Principal Problem:   CVA (cerebral infarction) Active Problems:   CAD, RCA PCI '09, 10/11, CABG X 4 5/12   Hypertension, B/P has been low this admission   Hyperlipidemia   Morbid obesity   OSA on CPAP  [name] is a 77 man with HTN, hyperlipidemia, DM and CAD s/p CABG and MI, OSA and obesity who has been found to have a right MCA stroke. He could not received tPA due to late presentation.   CVA: MRI/MRA showed acute infarct right MCA territory involving the  right temporal lobe, insula, and basal ganglia. No hemorrhage or midline shift and abrupt occlusion of the right M1 segment may represent embolus. MR Neck showed right carotid bifurcation is patent. Abrupt occlusion right M1 segment likely due to embolus. Left carotid bifurcation is patent. Moderate stenosis of the pre-cavernous internal carotid artery. Severe stenosis distal left vertebral artery. Carotid Doppler report showed 1-39% ICA stenosis.Vertebral artery flow is antegrade. Patient was seen by Dr. Naaman Wiley, PM&R, who agrees patient would be a great candidate for CIR. Since patient had an embolic stroke, patient got TEE to assess embolic stroke with loop recorder placement today. TEE found mild LVE with septal hypokinesis, EF 45%, mild MR, AV sclerosis, mild aortic root enlargement, mild TR. - D/c to CIR after loop placement and TEE - ASA 81 mg daily - Plavix 75 mg daily - Oxy IR 7.5 mg once for headache - Tylenol prn for headache - Telemetry monitoring - Frequent neuro checks - PT/OT/SLP - Bed Rest with PT - Dysphagia 2 diet - Atorvastatin 80 mg daily - Foley for urinary retention and inability to void; can do voiding trial when appropriate at CIR - F/u in stroke clinic in 4 weeks  CAD: Patient has prior history of MI with coronary angioplasty with stent x 3 2009/2012/2013 . Patient is on ASA 81 mg daily, Coreg 6.25 mg BID, Plavix 75 mg daily, Nitro prn, Imdur 15 mg daily, Ranolazine 500 mg BID and simvastatin 40 mg daily at home. - Continue ASA 81 mg daily - Continue Plavix 75 mg daily - Continue Atorvastatin 80 mg daily - Held coreg, nitro, imdur and ranolazine; reintroducing coreg today  H/o DM: HgbA1c 5.6. On this admission. Patient is not on any home diabetic medications. - Dysphagia 2 diet  HTN: Patient is on coreg 6.25 mg BID at home. BP on admission was 126/63, now in the 170s/80s. Allowed for permissive hypertension for 72 hours. - Resume BB today; continue to normalize BP  over next 3-5 days  HLD: Patient is on simvastatin 40 mg daily at home. Lipid profile shows total cholesterol 181, TG 105, HDL 40, and LDL 120. - Continue atorvastatin 80 mg daily  GERD: Patient is on omeprazole 20 mg daily at home. -Protonix 40 mg daily  OSA: Patient occasionally uses CPAP at home. - CPAP QHS - Encourage weight loss  DVT/PE ppx: Lovenox 30 mg SQ BID  Dispo: Disposition is deferred at this time, awaiting improvement of current medical problems.  Anticipated discharge in approximately 0 day(s).   The patient does have a current PCP Jeneen Rinks  Little, MD) and does not need an [facility] hospital follow-up appointment after discharge.  The patient does not have transportation limitations that hinder transportation to clinic appointments.  .Services Needed at time of discharge: Y = Yes, Blank = No PT:   OT:   RN:   Equipment:   Other:     LOS: 3 days   Drucilla Schmidt, MD 02/15/2014, 2:08 PM

## 2014-02-15 NOTE — Interval H&P Note (Signed)
History and Physical Interval Note:  02/15/2014 7:38 AM  Gregory Wiley  has presented today for surgery, with the diagnosis of stroke  The various methods of treatment have been discussed with the patient and family. After consideration of risks, benefits and other options for treatment, the patient has consented to  Procedure(s): TRANSESOPHAGEAL ECHOCARDIOGRAM (TEE) (N/A) as a surgical intervention .  The patient's history has been reviewed, patient examined, no change in status, stable for surgery.  I have reviewed the patient's chart and labs.  Questions were answered to the patient's satisfaction.     Jenkins Rouge

## 2014-02-15 NOTE — Progress Notes (Signed)
Physical Therapy Treatment Patient Details Name: Gregory Wiley MRN: [id number] DOB: [birth year] Today's Date: 02/15/2014    History of Present Illness 63 yo male admitted with L facial droop and L arm weakness. NIH = 17 MRI (+) R MCA infarct PMH: MI with coronary angioplasty with stent x3 DM, HTN, CAD, obesity, OSA, CABG, R knee surg, hadn surg, retinal detachment surg    PT Comments    Patient not progressing with mobility today due to pain in left ankle with any weightbearing limiting ability to stand. Able to scoot bottom along side bed with total A of 2 and increased cues to assist with clearing bottom. Tolerated dynamic sitting balance and able to attend to left side with constant cues. Increased difficulty with bed mobility requiring 2 person assist for safety and max cues (verbal and manual) required to perform all tasks. Eager to get to CIR. Will continue to follow acutely.   Follow Up Recommendations  CIR     Equipment Recommendations  Other (comment)    Recommendations for Other Services       Precautions / Restrictions Precautions Precautions: Fall Precaution Comments: L neglect, L hemiplegia Restrictions Weight Bearing Restrictions: No    Mobility  Bed Mobility Overal bed mobility: Needs Assistance;+2 for physical assistance Bed Mobility: Rolling;Sidelying to Sit;Sit to Sidelying Rolling: Max assist;+2 for physical assistance Sidelying to sit: Total assist;+2 for physical assistance     Sit to sidelying: +2 for physical assistance;Max assist General bed mobility comments: Requires max verbal and manual cues to attend to L to locate rail. Max A of 2 to roll to L side and total A for lowering BLEs and with elevation of trunk to get into sitting position. Max A of 2 with trunk and bringing BLEs into bed to return to supine.  Transfers Overall transfer level: Needs assistance Equipment used: 2 person hand held assist Transfers: Sit to/from Stand            General transfer comment: Attempted to stand x3 from elevated bed height however pt unable. Yelps of pain with any weight bearing through left ankle. Cues for hand placement and forward trunk lean for increased WB through LEs however unable to bear much weight through LLE.   Ambulation/Gait             General Gait Details: Deferred as pt unable to stand today.   Stairs            Wheelchair Mobility    Modified Rankin (Stroke Patients Only) Modified Rankin (Stroke Patients Only) Pre-Morbid Rankin Score: No symptoms Modified Rankin: Severe disability     Balance Overall balance assessment: Needs assistance Sitting-balance support: Feet supported;No upper extremity supported Sitting balance-Leahy Scale: Fair Sitting balance - Comments: Sitting EOB ~15 minutes performing dynamic reaching activities. Able to sit without UE support statically without LOB - some difficulty with trunk control when reaching outside BoS. Able to track towards left with constant verbal cues and able to attend to left side with cues. LOB x3 forward and to the left during reaching activities with no balance reactions.                             Cognition Arousal/Alertness: Awake/alert Behavior During Therapy: Flat affect Overall Cognitive Status: Impaired/Different from baseline Area of Impairment: Safety/judgement;Following commands       Following Commands: Follows one step commands with increased time Safety/Judgement: Decreased awareness of safety;Decreased awareness of deficits  General Comments: Pt requires increased time and mod/max cues for carrying out single step commands during session.     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   PT Goals (current goals can now be found in the care plan section) Progress towards PT goals: Not progressing toward goals - comment (Limited by pain in left ankle.)    Frequency  Min 5X/week    PT Plan Current plan remains appropriate    Co-evaluation             End of Session Equipment Utilized During Treatment: Gait belt Activity Tolerance: Patient limited by fatigue;Patient limited by pain Patient left: in bed;with call bell/phone within reach;with bed alarm set;with family/visitor present     Time: 4982-6415 PT Time Calculation (min) (ACUTE ONLY): 33 min  Charges:  $Therapeutic Activity: 8-22 mins $Neuromuscular Re-education: 8-22 mins                    G CodesCandy Sledge A 2014/03/12, 3:23 PM Candy Sledge, [place], DPT [phone]

## 2014-02-15 NOTE — CV Procedure (Signed)
   Transesophageal Echocardiogram: Indication:  CVA Sedation: Versed: 0, Fentanyl: 75, Other:   Note there was an entry in EPIC regarding anaphylaxis to versed and patient allergic to benedryl   ASA: 3, Airway: 3  Procedure:  The patient was moderately sedated with the above doses of versed and fentanyl.  Using digital technique an omniplane probe was advanced into the distal esophagus without incident. Transgastric imaging revealed mildly reduced l LV function with septal hypokinesis  and no mural apical thrombus.  Estimated ejection fraction was 45%.  Right sided cardiac chambers were normal with no evidence of pulmonary hypertension.  The pulmonary and tricuspid valves were structurally normal.  There was mild TR The mitral valve was structurally normal with mild mitral regurgitation.     The aortic valve was trileaflet with sclerosis but no stenosis The aortic root was mildly dilated   Imaging of the septum showed no ASD or VSD Bubble study was negative for shunt 2D and color flow confirmed no PFO  The LAA was well visualized in orthogonal views.  There was no spontaneous contrast and no thrombus.    The descending thoracic aorta had mild  mural aortic debris with no evidence of aneurysmal dilation or disection  Impression:  1) Mild LVE with septal hypokinesis EF 45% 2)  Negative bubble study with no PFO 3)  No LAA thrombus 4)  Mild MR 5)  AV sclerosis with no stenosis 6)  Mild aortic root enlargement 3.9 cm 7)  Normal RV 8)  Mild TR 9) No pericardial effusion   Jenkins Rouge 02/15/2014 4:07 PM

## 2014-02-15 NOTE — Progress Notes (Signed)
STROKE TEAM PROGRESS NOTE   HISTORY Gregory Wiley is a 63 y.o. male, left handed, with a past medical history significant for HTN,hyperlipidemia, DM, CAD s/p CABG, MI, ischemic cardiomyopathy, OSA on CPAP, and obesity, brought in as a code stroke by EMS due to acute onset of left hemiparesis, dysarthria, and left facial weakness NIHSS 17.. Wife  said that he went to bed at midnight and was well, woke up around 8:30 complaining of low back pain, had unsteady gait and fell to the floor. She said that she noticed that he was weak in the left side and had droopiness of the left face, and also had slurred speech. He did not improved and she called EMS. According to wife, these symptoms are worsened at time of admission but he presented as wake up stroke outside TPa and intervention windows. He was considered for CT angio/perfusion but iv access could not be obtained  Hence any intervention could not be done. Initial NIHSS 17.  CT brain showed no intracranial abnormality.    Date last known well: 02/12/14 Time last known well: midnight tPA Given: no, patient out of the window for thrombolysis. NIHSS: 17     SUBJECTIVE (INTERVAL HISTORY) Wife and daughter at bedside. They think patient is stable or minimally improving,   Stable no complaints.  OBJECTIVE Temp:  [97.8 F (36.6 C)-98.7 F (37.1 C)] 98.2 F (36.8 C) (12/01 1020) Pulse Rate:  [50-78] 50 (12/01 1020) Cardiac Rhythm:  [-] Normal sinus rhythm (11/30 1950) Resp:  [18-20] 18 (12/01 1020) BP: (130-145)/(59-70) 130/69 mmHg (12/01 1020) SpO2:  [93 %-98 %] 93 % (12/01 1020)  No results for input(s): GLUCAP in the last 168 hours.  Recent Labs Lab 02/12/14 0950 02/12/14 1004 02/14/14 0530 02/15/14 0445  NA 140 139 139 138  K 4.1 3.8 3.8 3.7  CL 106 109 105 101  CO2 21  --  19 23  GLUCOSE 137* 144* 105* 104*  BUN 13 13 11 11   CREATININE 1.09 1.10 1.08 1.04  CALCIUM 8.5  --  8.4 8.6    Recent Labs Lab 02/12/14 0950  AST 15   ALT 11  ALKPHOS 78  BILITOT 0.2*  PROT 7.1  ALBUMIN 3.2*    Recent Labs Lab 02/12/14 0950 02/12/14 1004 02/14/14 0530 02/15/14 0445  WBC 6.7  --  8.8 9.3  NEUTROABS 3.5  --   --   --   HGB 12.0* 13.3 11.7* 11.5*  HCT 37.5* 39.0 36.4* 35.9*  MCV 86.6  --  88.8 85.9  PLT 197  --  171 180   No results for input(s): CKTOTAL, CKMB, CKMBINDEX, TROPONINI in the last 168 hours. No results for input(s): LABPROT, INR in the last 72 hours.  Recent Labs  02/12/14 1900  COLORURINE YELLOW  LABSPEC 1.015  PHURINE 8.0  GLUCOSEU NEGATIVE  HGBUR NEGATIVE  BILIRUBINUR NEGATIVE  KETONESUR NEGATIVE  PROTEINUR NEGATIVE  UROBILINOGEN 0.2  NITRITE NEGATIVE  LEUKOCYTESUR TRACE*       Component Value Date/Time   CHOL 181 02/13/2014 0537   TRIG 105 02/13/2014 0537   TRIG 113 01/04/2013 1206   HDL 40 02/13/2014 0537   CHOLHDL 4.5 02/13/2014 0537   VLDL 21 02/13/2014 0537   LDLCALC 120* 02/13/2014 0537   LDLCALC 129* 01/04/2013 1206   Lab Results  Component Value Date   HGBA1C 5.6 02/13/2014      Component Value Date/Time   LABOPIA NONE DETECTED 02/12/2014 1900   COCAINSCRNUR NONE DETECTED 02/12/2014  [place] 02/12/2014 1900   AMPHETMU NONE DETECTED 02/12/2014 1900   THCU NONE DETECTED 02/12/2014 1900   LABBARB NONE DETECTED 02/12/2014 1900     Recent Labs Lab 02/12/14 0950  ETH <11    CT Head Wo Contrast 02/12/2014    No acute intracranial abnormality.  Paranasal sinus disease.    [name] 02/12/2014    Acute infarct right MCA territory involving the right temporal lobe, insula, and basal ganglia. No hemorrhage or midline shift  Abrupt occlusion of the right M1 segment may represent embolus.     PHYSICAL EXAM  PHYSICAL EXAM Physical exam: Exam: Gen: NAD Eyes: anicteric sclerae, moist conjunctivae                    CV: no MRG  Neuro: Mental Status: Alert, follows commands, oriented to name, month, date,  location   Speech:    Dysarthria due to left facial weakness  Cranial Nerves:    The pupils are equal, round, and reactive to light. Right gaze preference but EOMI. Fundi not visualized, VF show diminished response to threat on left,  left facial weakness, decreased sensation left face  Motor Observation:    no involuntary movements noted. Tone appears normal.    Strength:   Dense left hemiparesis 0/5 LUE and 1/5 LLE strength. Normal strength on right side     Sensation:    Left hemisensory loss  Plantars left upgoing.   Gait: Unable to test     Cortical Sensory Modalities:    double simultaneous extinction. Right gaze preference improved.      ASSESSMENT/PLAN [name] is a 62 y.o. male with history of hypertension, hyperlipidemia, diabetes mellitus, coronary artery disease with previous coronary artery bypass graft surgery and MI, ischemic cardiomyopathy, obstructive sleep apnea on C Pap, and obesity presenting with left hemiparesis and dysarthria. He did not receive IV t-PA due to late presentation. Medical non compliance.  Stroke:  Non-dominant - MRI - Acute infarct right MCA territory. Abrupt occlusion of the right M1 segment may represent embolus.    Resultant  Dense left hemiplegia, hemisensory loss and hemianopsia  MRI - Acute infarct right middle cerebral artery territory.  MRA - Abrupt occlusion of the right M1 segment may represent embolus.   Carotid Doppler - 1-39% ICA stenosis. Vertebral artery flow is antegrade.   2D Echo - Left ventricle: The cavity size was normal. Wall thickness was increased in a pattern of mild LVH. The estimated ejection fraction was 45%. There is hypokinesis of the mid-apicalanteroseptal myocardium. Probable hypokinesis of the apicalinferior myocardium.  LDL 120  HgbA1c 5.6  Lovenox for VTE prophylaxis  Diet NPO time specified with nectar thick liquids  He was on ASA and plavix before admission but per  wife not compliant with his medications.             Recommend resuming ASA and Plavix.  Patient counseled to be compliant with his antithrombotic medications  Ongoing aggressive stroke risk factor management  Therapy recommendations: CLR  Disposition:  CLR  Consider cardiology consult for TEE/Loop recorder  Hypertension  Home meds: Coreg 6.25 mg twice a day Permissive hypertension <220/120 for 24-48 hours and then gradually normalize within 5-7 days BP goal long term normotensive  Hyperlipidemia  Home meds:  Zocor 40 mg daily, needs to be counseled on medication compliance  LDL 120, goal < 70  Continue statin at discharge  Diabetes  HgbA1c  pending goal < 7.0   Other Stroke Risk Factors  Advanced age  Obesity, Body mass index is 47.46 kg/(m^2).   Coronary artery disease  Obstructive sleep apnea, on CPAP at home, not compliant  Other Active Problems  Dysphagia  Other Pertinent History Medication non compliance obstructive sleep apnea, not compliant with CPAP   Hospital day # 3     I have personally examined this patient, reviewed notes, independently viewed imaging studies, participated in medical decision making and plan of care. I have made any additions or clarifications directly to the above note. Agree with note above.  Long D/w patient, wife  about his stroke and need for further w/u with TEE and loop recorder prior o rehab transfer Follow up in stroke clinic in 4 weeks. Antony Contras, MD Medical Director [facility] Pager: [phone] 02/15/2014 1:41 PM    To contact Stroke Continuity provider, please refer to [email or web address] After hours, contact General Neurology

## 2014-02-15 NOTE — Progress Notes (Signed)
D/C orders received for pt to d/c to inpatient rehab. Pt and spouse notified and verbalized understanding. Report called to Ed, Therapist, sports. Pt transferred to [id number] by staff after fully awake from loop recorder placement.

## 2014-02-15 NOTE — Plan of Care (Signed)
Problem: Progression Outcomes Goal: Progressive activity as tolerated Outcome: Progressing Goal: Tolerating diet/TF at goal rate Outcome: Completed/Met Date Met:  02/15/14 Goal: Pain controlled Outcome: Progressing Goal: Educational plan initiated Outcome: Completed/Met Date Met:  02/15/14 Goal: Initial discharge plan initiated Outcome: Completed/Met Date Met:  02/15/14 Goal: Other Progression Outcomes Outcome: Progressing

## 2014-02-15 NOTE — Progress Notes (Addendum)
I have insurance approval and will plan admission to inpt rehab today after TEE and possible loop recorder placement. Pt and his wife are in agreement. [phone] I contacted Attending service of planned admit today and they are in agreement.

## 2014-02-15 NOTE — H&P (Signed)
Physical Medicine and Rehabilitation Admission H&P    Chief Complaint  Patient presents with  . Left sided weakness with sensory defiicis, right gaze preference due to Left HH and left facial weakness   HPI: Gregory Wiley is a 63 y.o. male with history of CAD, HTN, OSA, esophagitis/duodenitis, who was admitted on 02/12/14 with left sided weakness with unsteady gait/fall, left facial droop and slurred speech. Patient has been off ASA/plavix due to recent bleeding ulcer. Family reports non-compliant with medications as well as CPAP use.Patient with progressive weakness in ED. CT head negative. MRI brain revealed acute infarct right MCA territory involving the right temporal lobe, insula, and basal ganglia and abrupt occlusion right M1 segment question due to embolus. MRA head/neck revealed bilateral carotid bifurcation patent, moderate stenosis precavernous L-ICA and severe stenosis distal L-VA.  UDS negative.  2D echo with EF 28%, grade 2 diastolic dysfunction and hypokinesis mid-apicalanteroseptal myocardium. Carotid dopplers with 1-39% ICA stenosis. BSS done and patient started on dysphagia 2, nectar liquids due to dysphagia. Neurology following for input and recommended resuming ASA and plavix for likely embolic stroke.  Loop recorder placed today to rule out cryptogenic cause of stroke. Cognitive evaluation revealed moderate dysarthria with difficulty attending to task as well as left inattention. PT/OT evaluations done yesterday and CIR recommended by MD and rehab team. Patient admitted today for comprehensive rehab program.    Review of Systems  HENT: Negative for hearing loss.   Eyes: Negative for blurred vision and double vision.  Respiratory: Negative for cough and shortness of breath.   Cardiovascular: Positive for chest pain (chronic chest wall pain).  Gastrointestinal: Positive for heartburn, nausea and vomiting.  Genitourinary: Negative for urgency and frequency.    Musculoskeletal: Positive for myalgias, back pain and neck pain.  Neurological: Positive for sensory change, speech change, focal weakness and headaches. Negative for dizziness and tingling.  Psychiatric/Behavioral: The patient has insomnia.       Past Medical History  Diagnosis Date  . Coronary artery disease     stent, then CABG 07/20/10  . MI (myocardial infarction)     NSTEMI- last cath 06/2011-Stent to LCX-DES, last nuc 07/30/11 low risk  . Diabetes mellitus   . Hypertension   . Hyperlipidemia   . Obesity (BMI 30-39.9)   . OSA on CPAP     since July 2013  . H/O cardiomyopathy     ischemic, now last echo 07/30/11, EF 38%W  . Diverticulosis   . Internal hemorrhoids   . Tubular adenoma of colon   . Plantar fasciitis of left foot     Past Surgical History  Procedure Laterality Date  . Coronary artery bypass graft  07/20/2010     LIMA-LAD; VG-ACUTE MARG of RCA; Seq VG-distal RCA & then pda  . Coronary angioplasty with stent placement  07/01/2011    DES-Resolute to native LCX  . Coronary angioplasty with stent placement  12/01/2007    COMPLEX 5 LESION PCI INCLUDING CUTTING BALLOON AND 4 CYPHER DESs  . Hernia repair  2006  . Knee surgery Right   . Hand surgery      to take glass out  . Colonoscopy N/A 02/10/2013    Procedure: COLONOSCOPY;  Surgeon: Ladene Artist, MD;  Location: [facility];  Service: Endoscopy;  Laterality: N/A;  . Retinal detachment surgery      Family History  Problem Relation Age of Onset  . Diabetes type II Mother   . Coronary artery disease    .  Colon cancer Neg Hx   . CAD Father 34    CABG, PCI, died at 75  . Heart attack Father 74  . Hypertension Brother   . Diabetes Brother   . Hyperlipidemia Brother   . Brain cancer Maternal Grandmother   . COPD Paternal Grandfather     Social History: Married--wife disabled due to [place]. Independent and works part time driving cars for [employer]. He reports that he has quit smoking. He has never used  smokeless tobacco. He reports that he drinks mixed drink on rare occasions. He reports that he does not use illicit drugs.     Allergies  Allergen Reactions  . Diphenhydramine Hcl Anaphylaxis    Medications Prior to Admission  Medication Sig Dispense Refill  . aspirin 81 MG tablet Take 81 mg by mouth daily.    . carvedilol (COREG) 6.25 MG tablet Take 1 tablet (6.25 mg total) by mouth 2 (two) times daily. 180 tablet 3  . clopidogrel (PLAVIX) 75 MG tablet Take 75 mg by mouth daily.    . furosemide (LASIX) 40 MG tablet Take 20 mg by mouth daily as needed for fluid.     . isosorbide mononitrate (IMDUR) 30 MG 24 hr tablet Take 0.5 tablets (15 mg total) by mouth daily. 45 tablet 1  . nitroGLYCERIN (NITROSTAT) 0.4 MG SL tablet Place 0.4 mg under the tongue every 5 (five) minutes as needed. For chest pain    . omeprazole (PRILOSEC) 20 MG capsule Take 20 mg by mouth 2 (two) times daily.    . ranolazine (RANEXA) 500 MG 12 hr tablet Take 1 tablet (500 mg total) by mouth 2 (two) times daily. 180 tablet 1  . simvastatin (ZOCOR) 40 MG tablet Take 40 mg by mouth every evening.    . Vitamin D, Ergocalciferol, (DRISDOL) 50000 UNITS CAPS capsule Take 50,000 Units by mouth every 7 (seven) days. Take on Mondays      Home: [place] expects to be discharged to:: Private residence Living Arrangements: Spouse/significant other Available Help at Discharge: Family, Available 24 hours/day (wife available for 24/7 S, no assist, daughter available intermittently) Type of Home: House Home Access: Stairs to enter [employer] of Steps: 2 and small threshold Entrance Stairs-Rails: None Home Layout: One level Home Equipment: Cane - single point, Hand held shower head, Walker - 2 wheels, Wheelchair - manual Additional Comments: equipment is wife's as she has MS  Lives With: Spouse   Functional History: Prior Function Level of Independence: Independent  Functional Status:    Mobility: Bed Mobility Overal bed mobility: Needs Assistance, +2 for physical assistance Bed Mobility: Rolling, Sidelying to Sit Rolling: Max assist Sidelying to sit: Total assist, +2 for physical assistance General bed mobility comments: Pt requires total A verbal and sound cues for attending to L to locate rail.  also requires [facility] assist for finding rail in order to roll to L side.  Max A for rolling to L side and total A (+2) for lowering BLEs, maintaining trunk in hip and trunk flexion for weight shift over to the R hip for sitting position.   Transfers Overall transfer level: Needs assistance Equipment used: 2 person hand held assist Transfers: Sit to/from Stand, Stand Pivot Transfers Sit to Stand: Max assist, +2 physical assistance Stand pivot transfers: Total assist, +2 physical assistance General transfer comment: Pt able to stand x 3 reps with max A (+2 for safety and forward weight shift as pt is impulsive with movement),  Cues for hand  placement, safety and increasing forward trunk lean for increased WB through LEs.  Performed stand pivot transfer to recliner.  Requires mod cues for increased weight shift to the R for better ability to advance LLE, however still required physical assist to move LLE.   Ambulation/Gait Ambulation/Gait assistance: +2 physical assistance, Total assist (+3 for chair follow) Ambulation Distance (Feet): 4 Feet Assistive device:  (R handrail, PT under L axilla, OT in front, +3 chair follow) Gait Pattern/deviations: Step-to pattern, Decreased step length - left, Decreased stance time - left, Trunk flexed, Decreased weight shift to right, Wide base of support    ADL: ADL Overall ADL's : Needs assistance/impaired General ADL Comments: Progressed pt to standing and completed stand-pivot transfer to chair.  Cognition: Cognition Overall Cognitive Status: Impaired/Different from baseline Arousal/Alertness: Lethargic Orientation Level: Oriented  X4 Attention: Sustained Sustained Attention: Impaired Sustained Attention Impairment: Functional basic Awareness: Impaired Awareness Impairment: Emergent impairment Problem Solving: Impaired Problem Solving Impairment: Functional basic Behaviors: Impulsive Safety/Judgment: Impaired Cognition Arousal/Alertness: Lethargic Behavior During Therapy: Flat affect Overall Cognitive Status: Impaired/Different from baseline Area of Impairment: Attention, Following commands, Safety/judgement, Awareness Current Attention Level: Sustained Following Commands: Follows one step commands with increased time Safety/Judgement: Decreased awareness of safety, Decreased awareness of deficits Awareness: Emergent General Comments: Pt requires increased time and mod/max cues for carrying out single step commands during session.   Physical Exam: Blood pressure 137/64, pulse 63, temperature 98.2 F (36.8 C), temperature source Oral, resp. rate 18, height 6' (1.829 m), weight 158.759 kg (350 lb), SpO2 93 %. Physical Exam Nursing note and vitals reviewed. Constitutional: He is oriented to person, place, and time. He appears well-developed and well-nourished. He appears lethargic. He is easily aroused. Light sensitivity noted Morbidly obese male.  More alert. Large beard and white head of hair. HENT: dentition fair to poor. Oral mucosa pink and moist Head: Normocephalic and atraumatic.  Eyes: Conjunctivae are normal. Pupils are equal, round, and reactive to light.  Neck: Normal range of motion. Neck supple.  Cardiovascular: Normal rate and regular rhythm.  Respiratory: Effort normal. No respiratory distress. He has no wheezes. He has rhonchi in the right lower field. He has rales in the right lower field. He exhibits tenderness on chest wall.  GI: Soft. Bowel sounds are normal. He exhibits no distension. There is no tenderness.  Neurological: He is oriented to person, place, and time. More alert. Impaired  insight and awareness. Speech slurred.  Left inattention.  Able to follow occasional two step commands with cues. Left sided weakness with emerging tone. Some pain with left shoulder and left ankle with PROM. LUE: 0/5 prox to distal. LLE: 0/5 prox to distal. Fair trunk control in sitting position.  DTR's increased on left, toes up left foot.  Skin: Skin is warm and dry.  Psychiatric:  Flat, fatigued    Results for orders placed or performed during the hospital encounter of 02/12/14 (from the past 48 hour(s))  CBC     Status: Abnormal   Collection Time: 02/14/14  5:30 AM  Result Value Ref Range   WBC 8.8 4.0 - 10.5 K/uL   RBC 4.10 (L) 4.22 - 5.81 MIL/uL   Hemoglobin 11.7 (L) 13.0 - 17.0 g/dL   HCT 36.4 (L) 39.0 - 52.0 %   MCV 88.8 78.0 - 100.0 fL   MCH 28.5 26.0 - 34.0 pg   MCHC 32.1 30.0 - 36.0 g/dL   RDW 14.0 11.5 - 15.5 %   Platelets 171 150 - 400 K/uL  Basic metabolic panel     Status: Abnormal   Collection Time: 02/14/14  5:30 AM  Result Value Ref Range   Sodium 139 137 - 147 mEq/L   Potassium 3.8 3.7 - 5.3 mEq/L   Chloride 105 96 - 112 mEq/L   CO2 19 19 - 32 mEq/L   Glucose, Bld 105 (H) 70 - 99 mg/dL   BUN 11 6 - 23 mg/dL   Creatinine, Ser 1.08 0.50 - 1.35 mg/dL   Calcium 8.4 8.4 - 10.5 mg/dL   GFR calc non Af Amer 71 (L) >90 mL/min   GFR calc Af Amer 82 (L) >90 mL/min    Comment: (NOTE) The eGFR has been calculated using the CKD EPI equation. This calculation has not been validated in all clinical situations. eGFR's persistently <90 mL/min signify possible Chronic Kidney Disease.    Anion gap 15 5 - 15  CBC     Status: Abnormal   Collection Time: 02/15/14  4:45 AM  Result Value Ref Range   WBC 9.3 4.0 - 10.5 K/uL   RBC 4.18 (L) 4.22 - 5.81 MIL/uL   Hemoglobin 11.5 (L) 13.0 - 17.0 g/dL   HCT 35.9 (L) 39.0 - 52.0 %   MCV 85.9 78.0 - 100.0 fL   MCH 27.5 26.0 - 34.0 pg   MCHC 32.0 30.0 - 36.0 g/dL   RDW 13.7 11.5 - 15.5 %   Platelets 180 150 - 400 K/uL  Basic  metabolic panel     Status: Abnormal   Collection Time: 02/15/14  4:45 AM  Result Value Ref Range   Sodium 138 137 - 147 mEq/L   Potassium 3.7 3.7 - 5.3 mEq/L   Chloride 101 96 - 112 mEq/L   CO2 23 19 - 32 mEq/L   Glucose, Bld 104 (H) 70 - 99 mg/dL   BUN 11 6 - 23 mg/dL   Creatinine, Ser 1.04 0.50 - 1.35 mg/dL   Calcium 8.6 8.4 - 10.5 mg/dL   GFR calc non Af Amer 74 (L) >90 mL/min   GFR calc Af Amer 86 (L) >90 mL/min    Comment: (NOTE) The eGFR has been calculated using the CKD EPI equation. This calculation has not been validated in all clinical situations. eGFR's persistently <90 mL/min signify possible Chronic Kidney Disease.    Anion gap 14 5 - 15   [name]  02/13/2014   CLINICAL DATA:  Right MCA stroke.  Right M1 occlusion  EXAM: MRI HEAD AND MRA NECK WITH CONTRAST  TECHNIQUE: Multiplanar and multiecho pulse sequences of the neck were obtained with intravenous contrast. Angiographic images of the neck were obtained using MRA technique with intravenous contast.  CONTRAST:  22m MULTIHANCE GADOBENATE DIMEGLUMINE 529 MG/ML IV SOLN  COMPARISON:  MRI 02/12/2014  FINDINGS: Image quality degraded by mild motion.  3 vessel aortic arch. Proximal great vessels are patent. Signal loss in the proximal right subclavian artery likely is due to tortuosity.  Both vertebral arteries are patent to the basilar with antegrade flow. There is a severe focal stenosis of the distal left vertebral artery. The basilar is patent.  Right carotid bifurcation widely patent without significant stenosis or dissection. Cavernous carotid shows mild irregularity without significant stenosis. Abrupt occlusion of the right M1 segment as noted yesterday.  Left carotid bifurcation widely patent. Left cervical carotid patent. There is a moderate stenosis of the left internal carotid artery in the pre cavernous segment. This area was obscured by artifact yesterday.  IMPRESSION: Right carotid bifurcation is  patent. Abrupt occlusion right M1 segment likely due to embolus.  Left carotid bifurcation is patent. Moderate stenosis of the pre cavernous internal carotid artery.  Severe stenosis distal left vertebral artery.   Electronically Signed   By: Franchot Gallo M.D.   On: 02/13/2014 13:36       Medical Problem List and Plan: 1. Functional deficits secondary to Right MCA infarct with left hemiparesis, dysarthria, dysphagia,  and visual spatial deficits 2.  DVT Prophylaxis/Anticoagulation: Pharmaceutical: Lovenox 3. New onset Headaches/Pain Management: Continue tylenol prn for now. Treat left achilles pain with stretching and ice.  4. Mood: Poor frustration tolerance noted. LCSW to follow for evaluation and support.  5. Neuropsych: This patient is not capable of making decisions on his own behalf. 6. Skin/Wound Care:  Routine pressure relief measures.  7. Fluids/Electrolytes/Nutrition: Monitor I/O. Check follow up labs in am. Offer supplements as indicated if intake poor.  8. HTN: Will monitor every 8 hours. Currently controlled of medications (and to avoid hypotension). will resume medications as indicated. Continue  9.  H/o DM type 2?: Hgb A1c- 5.6 . Patient and wife deny any history of diabetes 10 OSA: Continue CPAP (has home machine) whenever asleep.  11. Recent esophagitis/duodenitis due to gastric ulcer: Protonix bid to help promote healing 12. CAD: Has chest wall pain due to poorly  Healed sternum. Continue ASA, plavix, lipitor--off imdur and coreg  13. ABLA: Check H/H in am.      Post Admission Physician Evaluation: 1. Functional deficits secondary  to embolic right MCA infarct. 2. Patient is admitted to receive collaborative, interdisciplinary care between the physiatrist, rehab nursing staff, and therapy team. 3. Patient's level of medical complexity and substantial therapy needs in context of that medical necessity cannot be provided at a lesser intensity of care such as a  SNF. 4. Patient has experienced substantial functional loss from his/her baseline which was documented above under the "Functional History" and "Functional Status" headings.  Judging by the patient's diagnosis, physical exam, and functional history, the patient has potential for functional progress which will result in measurable gains while on inpatient rehab.  These gains will be of substantial and practical use upon discharge  in facilitating mobility and self-care at the household level. 5. Physiatrist will provide 24 hour management of medical needs as well as oversight of the therapy plan/treatment and provide guidance as appropriate regarding the interaction of the two. 6. 24 hour rehab nursing will assist with bladder management, bowel management, safety, skin/wound care, disease management, medication administration, pain management and patient education  and help integrate therapy concepts, techniques,education, etc. 7. PT will assess and treat for/with: Lower extremity strength, range of motion, stamina, balance, functional mobility, safety, adaptive techniques and equipment, NMR, visual spatial and cognitive perceptual awareness, pain mgt, family ed.   Goals are: min assist. 8. OT will assess and treat for/with: ADL's, functional mobility, safety, upper extremity strength, adaptive techniques and equipment, NMR, visual spatial and cognitive perceptual awareness, family ed, stroke education, ego support.   Goals are: supervision to min/mod assist. Therapy may proceed with showering this patient. 9. SLP will assess and treat for/with: cognition, communication, swallowing.  Goals are: supervision to mod I. 10. Case Management and Social Worker will assess and treat for psychological issues and discharge planning. 11. Team conference will be held weekly to assess progress toward goals and to determine barriers to discharge. 12. Patient will receive at least 3 hours of therapy per day  at least 5 days  per week. 13. ELOS: 22-30 days       14. Prognosis:  good     Meredith Staggers, MD, [place] [facility] 02/15/2014   02/15/2014

## 2014-02-15 NOTE — Interval H&P Note (Signed)
Gregory Wiley was admitted today to Inpatient Rehabilitation with the diagnosis of embolic right mca infarct.  The patient's history has been reviewed, patient examined, and there is no change in status.  Patient continues to be appropriate for intensive inpatient rehabilitation.  I have reviewed the patient's chart and labs.  Questions were answered to the patient's satisfaction.  SWARTZ,ZACHARY T 02/15/2014, 9:59 PM

## 2014-02-15 NOTE — CV Procedure (Signed)
SURGEON:  Thompson Grayer, MD     PREPROCEDURE DIAGNOSIS:  Cryptogenic Stroke    POSTPROCEDURE DIAGNOSIS:  Cryptogenic Stroke     PROCEDURES:   1. Implantable loop recorder implantation    INTRODUCTION:  Gregory Wiley is a 63 y.o. male with a history of unexplained stroke who presents today for implantable loop implantation.  The patient has had a cryptogenic stroke.  Despite an extensive workup by neurology, no reversible causes have been identified.  he has worn telemetry during which he did not have arrhythmias.  There is significant concern for possible atrial fibrillation as the cause for the patients stroke.  The patient therefore presents today for implantable loop implantation.     DESCRIPTION OF PROCEDURE:  Informed written consent was obtained, and the patient was brought to the electrophysiology lab in a fasting state.  The patient required no sedation for the procedure today.  Mapping over the patient's chest was performed by the EP lab staff to identify the area where electrograms were most prominent for ILR recording.  This area was found to be the left parasternal region over the 3rd-4th intercostal space. The patients left chest was therefore prepped and draped in the usual sterile fashion by the EP lab staff. The skin overlying the left parasternal region was infiltrated with lidocaine for local analgesia.  A 0.5-cm incision was made over the left parasternal region over the 3rd intercostal space.  A subcutaneous ILR pocket was fashioned using a combination of sharp and blunt dissection.  A Medtronic Reveal [place] model [id number] implantable loop recorder was then placed into the pocket  R waves were very prominent and measured 0.52mV. EBL<1 ml.  Steri- Strips and a sterile dressing were then applied.  There were no early apparent complications.     CONCLUSIONS:   1. Successful implantation of a Medtronic Reveal LINQ implantable loop recorder for cryptogenic stroke  2. No early  apparent complications.

## 2014-02-16 ENCOUNTER — Inpatient Hospital Stay (HOSPITAL_COMMUNITY): Payer: 59 | Admitting: Speech Pathology

## 2014-02-16 ENCOUNTER — Inpatient Hospital Stay (HOSPITAL_COMMUNITY): Payer: 59

## 2014-02-16 ENCOUNTER — Inpatient Hospital Stay (HOSPITAL_COMMUNITY): Payer: 59 | Admitting: Occupational Therapy

## 2014-02-16 ENCOUNTER — Encounter (HOSPITAL_COMMUNITY): Payer: Self-pay | Admitting: Cardiovascular Disease

## 2014-02-16 ENCOUNTER — Inpatient Hospital Stay (HOSPITAL_COMMUNITY): Payer: 59 | Admitting: Physical Therapy

## 2014-02-16 DIAGNOSIS — G8192 Hemiplegia, unspecified affecting left dominant side: Secondary | ICD-10-CM | POA: Diagnosis present

## 2014-02-16 DIAGNOSIS — G8102 Flaccid hemiplegia affecting left dominant side: Secondary | ICD-10-CM

## 2014-02-16 LAB — CBC WITH DIFFERENTIAL/PLATELET
Basophils Absolute: 0 10*3/uL (ref 0.0–0.1)
Basophils Relative: 0 % (ref 0–1)
Eosinophils Absolute: 0.1 10*3/uL (ref 0.0–0.7)
Eosinophils Relative: 1 % (ref 0–5)
HCT: 36.1 % — ABNORMAL LOW (ref 39.0–52.0)
Hemoglobin: 11.6 g/dL — ABNORMAL LOW (ref 13.0–17.0)
Lymphocytes Relative: 19 % (ref 12–46)
Lymphs Abs: 1.8 10*3/uL (ref 0.7–4.0)
MCH: 27.4 pg (ref 26.0–34.0)
MCHC: 32.1 g/dL (ref 30.0–36.0)
MCV: 85.3 fL (ref 78.0–100.0)
Monocytes Absolute: 1 10*3/uL (ref 0.1–1.0)
Monocytes Relative: 10 % (ref 3–12)
Neutro Abs: 6.9 10*3/uL (ref 1.7–7.7)
Neutrophils Relative %: 70 % (ref 43–77)
Platelets: 192 10*3/uL (ref 150–400)
RBC: 4.23 MIL/uL (ref 4.22–5.81)
RDW: 13.8 % (ref 11.5–15.5)
WBC: 9.8 10*3/uL (ref 4.0–10.5)

## 2014-02-16 LAB — COMPREHENSIVE METABOLIC PANEL
ALT: 16 U/L (ref 0–53)
AST: 21 U/L (ref 0–37)
Albumin: 3.2 g/dL — ABNORMAL LOW (ref 3.5–5.2)
Alkaline Phosphatase: 73 U/L (ref 39–117)
Anion gap: 14 (ref 5–15)
BUN: 12 mg/dL (ref 6–23)
CO2: 23 mEq/L (ref 19–32)
Calcium: 8.6 mg/dL (ref 8.4–10.5)
Chloride: 104 mEq/L (ref 96–112)
Creatinine, Ser: 1.04 mg/dL (ref 0.50–1.35)
GFR calc Af Amer: 86 mL/min — ABNORMAL LOW (ref >90)
GFR calc non Af Amer: 74 mL/min — ABNORMAL LOW (ref >90)
Glucose, Bld: 111 mg/dL — ABNORMAL HIGH (ref 70–99)
Potassium: 3.9 mEq/L (ref 3.7–5.3)
Sodium: 141 mEq/L (ref 137–147)
Total Bilirubin: 0.7 mg/dL (ref 0.3–1.2)
Total Protein: 7.2 g/dL (ref 6.0–8.3)

## 2014-02-16 LAB — SEDIMENTATION RATE: Sed Rate: 50 mm/hr — ABNORMAL HIGH (ref 0–16)

## 2014-02-16 LAB — URIC ACID: Uric Acid, Serum: 8.6 mg/dL — ABNORMAL HIGH (ref 4.0–7.8)

## 2014-02-16 MED ORDER — RESOURCE THICKENUP CLEAR PO POWD
ORAL | Status: DC | PRN
  Filled 2014-02-16 (×2): qty 125

## 2014-02-16 MED ORDER — SENNOSIDES-DOCUSATE SODIUM 8.6-50 MG PO TABS
2.0000 | ORAL_TABLET | Freq: Two times a day (BID) | ORAL | Status: DC
  Administered 2014-02-16: 2 via ORAL
  Filled 2014-02-16: qty 2

## 2014-02-16 MED ORDER — PANTOPRAZOLE SODIUM 40 MG PO PACK
40.0000 mg | PACK | Freq: Two times a day (BID) | ORAL | Status: DC
  Administered 2014-02-16 – 2014-02-18 (×5): 40 mg
  Filled 2014-02-16 (×9): qty 20

## 2014-02-16 MED ORDER — SENNOSIDES 8.8 MG/5ML PO SYRP
10.0000 mL | ORAL_SOLUTION | Freq: Two times a day (BID) | ORAL | Status: DC
  Administered 2014-02-16 – 2014-02-20 (×8): 10 mL via ORAL
  Filled 2014-02-16 (×14): qty 10

## 2014-02-16 NOTE — Evaluation (Addendum)
Occupational Therapy Assessment and Plan  Patient Details  Name: Gregory Wiley MRN: [id number] Date of Birth: May 28, 1950  OT Diagnosis: abnormal posture, cognitive deficits, disturbance of vision, flaccid hemiplegia and hemiparesis, hemiplegia affecting dominant side and muscle weakness (generalized) Rehab Potential: Rehab Potential (ACUTE ONLY): Fair ELOS: 26-28 days   Today's Date: 02/16/2014 OT Individual Time: 0737-1062 OT Individual Time Calculation (min): 62 min     Problem List:  Patient Active Problem List   Diagnosis Date Noted  . Hemiplegia affecting left dominant side 02/16/2014  . 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 Benign neoplasm of colon 02/10/2013  . Special screening for malignant neoplasms, colon 02/10/2013  . OSA on CPAP 01/06/2013  . S/P angioplasty with DES to CFX 07/01/11 07/02/2011  . Sleep apnea, "cant afford C-pap" 07/02/2011  . CAD, RCA PCI '09, 10/11, CABG X 4 5/12 06/30/2011  . Hypertension, B/P has been low this admission 06/30/2011  . Hyperlipidemia 06/30/2011  . Morbid obesity 06/30/2011  . NSTEMI, 06/29/11 06/30/2011  . 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 Ischemic cardiomyopathy, EF 35-40 2D May 2012 06/30/2011  . CHF, acute, mild 06/30/2011    Past Medical History:  Past Medical History  Diagnosis Date  . Coronary artery disease     stent, then CABG 07/20/10  . MI (myocardial infarction)     NSTEMI- last cath 06/2011-Stent to LCX-DES, last nuc 07/30/11 low risk  . Diabetes mellitus   . Hypertension   . Hyperlipidemia   . Obesity (BMI 30-39.9)   . OSA on CPAP     since July 2013  . H/O cardiomyopathy     ischemic, now last echo 07/30/11, EF 38%W  . Diverticulosis   . Internal hemorrhoids   . Tubular adenoma of colon   . Plantar fasciitis of left foot   . Stroke    Past Surgical History:  Past Surgical History  Procedure Laterality Date  . Coronary artery bypass graft  07/20/2010      LIMA-LAD; VG-ACUTE MARG of RCA; Seq VG-distal RCA & then pda  . Coronary angioplasty with stent placement  07/01/2011    DES-Resolute to native LCX  . Coronary angioplasty with stent placement  12/01/2007    COMPLEX 5 LESION PCI INCLUDING CUTTING BALLOON AND 4 CYPHER DESs  . Hernia repair  2006  . Knee surgery Right   . Hand surgery      to take glass out  . Colonoscopy N/A 02/10/2013    Procedure: COLONOSCOPY;  Surgeon: Ladene Artist, MD;  Location: [facility] ENDOSCOPY;  Service: Endoscopy;  Laterality: N/A;  . Retinal detachment surgery    . Loop recorder implant  02/15/2014    MDT LINQ implanted by Dr Rayann Heman for cryptogenic stroke  . Tee without cardioversion N/A 02/15/2014    Procedure: TRANSESOPHAGEAL ECHOCARDIOGRAM (TEE);  Surgeon: Josue Hector, MD;  Location: [facility] ENDOSCOPY;  Service: Cardiovascular;  Laterality: N/A;    Assessment & Plan Clinical Impression: Patient is a 63 y.o. year old male with recent admission to the hospital on11/28/15 with left sided weakness with unsteady gait/fall, left facial droop and slurred speech. Patient has been off ASA/plavix due to recent bleeding ulcer. Family reports non-compliant with medications as well as CPAP use. Patient with progressive weakness in ED. CT head negative. MRI brain revealed acute infarct right MCA territory involving the right temporal lobe, insula, and basal ganglia and abrupt occlusion right M1 segment question due to embolus.  Patient transferred to  [facility] on 02/15/2014 .    Patient currently requires total +2-3 with basic self-care skills secondary to muscle weakness, impaired timing and sequencing, unbalanced muscle activation, decreased coordination and decreased motor planning, field cut and hemianopsia, decreased attention to left and left side neglect, decreased attention, decreased awareness, decreased problem solving, decreased safety awareness, decreased memory and delayed processing and decreased sitting balance, decreased  standing balance, decreased postural control, hemiplegia and decreased balance strategies.  Prior to hospitalization, patient could complete ADLs with independent .  Patient will benefit from skilled intervention to decrease level of assist with basic self-care skills and increase independence with basic self-care skills prior to discharge to SNF for further rehab.  Anticipate patient will require max assist and SNF rehab.  OT - End of Session Activity Tolerance: Tolerates 30+ min activity with multiple rests Endurance Deficit: Yes OT Assessment Rehab Potential (ACUTE ONLY): Fair Barriers to Discharge: Decreased caregiver support Barriers to Discharge Comments: wife can only provide supervision level assistance OT Patient demonstrates impairments in the following area(s): Balance;Cognition;Edema;Endurance;Motor;Pain;Perception;Safety;Sensory;Vision OT Basic ADL's Functional Problem(s): Eating;Grooming;Bathing;Dressing;Toileting OT Transfers Functional Problem(s): Toilet;Tub/Shower OT Additional Impairment(s): Fuctional Use of Upper Extremity OT Plan OT Intensity: Minimum of 1-2 x/day, 45 to 90 minutes OT Frequency: 5 out of 7 days OT Duration/Estimated Length of Stay: 26-28 days OT Treatment/Interventions: Balance/vestibular training;Cognitive remediation/compensation;DME/adaptive equipment instruction;Community reintegration;Discharge planning;Functional mobility training;Psychosocial support;Therapeutic Activities;Visual/perceptual remediation/compensation;Patient/family education;Functional electrical stimulation;Splinting/orthotics;UE/LE Coordination activities;UE/LE Strength taining/ROM;Pain management;Neuromuscular re-education;Disease mangement/prevention;Self Care/advanced ADL retraining;Therapeutic Exercise;Wheelchair propulsion/positioning OT Self Feeding Anticipated Outcome(s): supervision OT Basic Self-Care Anticipated Outcome(s): max assist OT Toileting Anticipated Outcome(s): max  assist OT Bathroom Transfers Anticipated Outcome(s): max assist OT Recommendation Patient destination: [place] (SNF) Follow Up Recommendations: Skilled nursing facility Equipment Recommended: To be determined   OT Evaluation Precautions/Restrictions  Precautions Precautions: Fall Precaution Comments: L neglect, L hemiplegia, morbid obesity Restrictions Weight Bearing Restrictions: No  Pain  Pt with severe ankle pain with movement, 10/10  Home Living/Prior Functioning Home Living Living Arrangements: Spouse/significant other Available Help at Discharge: Family, Available 24 hours/day Type of Home: House Home Access: Stairs to enter [employer] of Steps: 2 and small threshold Entrance Stairs-Rails: None Home Layout: One level Additional Comments: wife has MS  Lives With: Spouse IADL History Occupation: Part time employment Prior Function Level of Independence: Independent with gait, Independent with transfers  Able to Take Stairs?: Yes Driving: Yes Vocation: Part time employment Leisure: Hobbies-yes (Comment) Comments: driving ADL  See FIM scale for details  Vision/Perception  Vision- History Baseline Vision/History: Wears glasses Wears Glasses: Reading only Patient Visual Report: No change from baseline Vision- Assessment Vision Assessment?: Yes Eye Alignment: Impaired (comment) (right peripheral gaze) Tracking/Visual Pursuits: Impaired - to be further tested in functional context Visual Fields: Left visual field deficit Additional Comments: Pt was able to track across midline to the left visual field but did not maintain fixed gaze to the left side.  He was able to identify the color of his wife's shirt, who was sitting in front of him to the left side.  Decreased smoothness of tracking throughout however pt able to correctly identify therapist's fingers in all quadrants.  Perception Comments: Pt needed max instructional cueing to look  left of midline for locating grooming items.    Cognition Overall Cognitive Status: Impaired/Different from baseline Arousal/Alertness: Awake/alert Orientation Level: Oriented to place;Oriented to time;Oriented to situation;Oriented to person Attention: Sustained;Focused Focused Attention: Appears intact Sustained Attention: Impaired Sustained Attention Impairment: Functional basic Awareness: Impaired Awareness Impairment: Intellectual impairment;Emergent impairment Problem  Solving: Impaired Problem Solving Impairment: Functional basic Behaviors: Impulsive Safety/Judgment: Impaired Comments: Pt with decreased awareness of visual deficits on evaluation.  He needed mod questioning cues to identify deficits in the left side. Sensation Sensation Light Touch: Appears Intact;Impaired Detail Light Touch Impaired Details: Impaired LUE Stereognosis: Not tested Hot/Cold: Not tested Proprioception: Not tested Additional Comments: Pt unable to detect light touch consistently from shoulder to fingers.  He was able to determine deep pressure at the forearm.  Will continue to evaluat further in function.   Coordination Gross Motor Movements are Fluid and Coordinated: No Fine Motor Movements are Fluid and Coordinated: No Coordination and Movement Description: Pt is currently Brunnstrum stage I in the left arm and hand.  No noted movement observed to command.  Motor  Motor Motor: Hemiplegia;Abnormal postural alignment and control Motor - Skilled Clinical Observations: Dense L sided hemiplegia, patient unable to tolerate WB through L ankle due to 10/10 pain Mobility  Bed Mobility Bed Mobility: Rolling Right;Rolling Left;Left Sidelying to Sit Rolling Right: 1: +2 Total assist Rolling Right: Patient Percentage: 10% Rolling Right Details: Manual facilitation for weight shifting;Verbal cues for precautions/safety;Manual facilitation for placement;Verbal cues for sequencing Rolling Left: 3: Mod  assist;With rail Rolling Left Details: Verbal cues for technique;Manual facilitation for weight shifting Left Sidelying to Sit: HOB flat;1: +2 Total assist Left Sidelying to Sit: Patient Percentage: 10% Transfers Transfers: Not assessed  Trunk/Postural Assessment  Cervical Assessment Cervical Assessment: Exceptions to [facility] (keeps head turned to the right side) Thoracic Assessment Thoracic Assessment: Within Functional Limits Lumbar Assessment Lumbar Assessment: Within Functional Limits Postural Control Postural Control: Deficits on evaluation Righting Reactions: Pt with slight increased weightshift over the left hip with left trunk elongation.   Protective Responses: delayed/impaired  Balance Balance Balance Assessed: Yes Static Sitting Balance Static Sitting - Balance Support: Feet supported;Right upper extremity supported Static Sitting - Level of Assistance: 5: Stand by assistance Dynamic Sitting Balance Dynamic Sitting - Balance Support: Feet supported;Right upper extremity supported Extremity/Trunk Assessment RUE Assessment RUE Assessment: Within Functional Limits LUE Assessment LUE Assessment: Exceptions to [facility] LUE Strength LUE Overall Strength Comments: Pt with Brunnstrum stage I movement in the left arm and hand.  PROM WFLs at the elbow and digits.  Shoulder flexion PROM 0-120 degrees with pt reporting increased pain.  Inferior subluxation noted.  Pt also with increased edema noted in the left hand.   FIM:  FIM - Grooming Grooming Steps: Wash, rinse, dry face Grooming: 2: Patient completes 1 of 4 or 2 of 5 steps FIM - Bathing Bathing Steps Patient Completed: Chest;Abdomen;Right upper leg;Left upper leg Bathing: 1: Two helpers FIM - Upper Body Dressing/Undressing Upper body dressing/undressing steps patient completed: Thread/unthread right sleeve of pullover shirt/dresss;Put head through opening of pull over shirt/dress Upper body dressing/undressing: 3: Mod-Patient  completed 50-74% of tasks FIM - Lower Body Dressing/Undressing Lower body dressing/undressing: 1: Two helpers FIM - Toileting Toileting: 0: Activity did not occur FIM - Control and instrumentation engineer Devices: Sliding board;Arm rests Bed/Chair Transfer: 1: Two helpers FIM - Air cabin crew Transfers: 1-Mechanical lift FIM - Camera operator Transfers: 0-Activity did not occur or was simulated   Refer to Care Plan for Long Term Goals  Recommendations for other services: Neuropsych  Discharge Criteria: Patient will be discharged from OT if patient refuses treatment 3 consecutive times without medical reason, if treatment goals not met, if there is a change in medical status, if patient makes no progress towards goals or if patient is discharged  from hospital.  The above assessment, treatment plan, treatment alternatives and goals were discussed and mutually agreed upon: by patient and by family   Pt performed bathing and dressing supine to sit EOB.  Total assist +2 overall for rolling to the right side for donning brief and pull up shorts.  Mod assist to roll to the left side with use of rail.  Pt needed max instructional cueing for scanning left of midline for locating grooming items.  Total +2 for supine to sit but once in sitting he was able to maintain with supervision while performing UB bathing and dressing.  Total assist for integration of the LUE into bathing tasks.  Pt donned pullover shirt but did not attempt donning the LUE.  Pt left sitting on the edge of the bed with PT and therapy tech.  Denise Washburn OTR/L 02/16/2014, 3:24 PM

## 2014-02-16 NOTE — Progress Notes (Signed)
Patient has home CPAP at bedside.  Placed pt on via nasal prongs, auto titrate settings (already set) (min 4.0 max 20.0)

## 2014-02-16 NOTE — Progress Notes (Signed)
63 y.o. male with history of CAD, HTN, OSA, esophagitis/duodenitis, who was admitted on 02/12/14 with left sided weakness with unsteady gait/fall, left facial droop and slurred speech. Patient has been off ASA/plavix due to recent bleeding ulcer. Family reports non-compliant with medications as well as CPAP use.Patient with progressive weakness in ED. CT head negative. MRI brain revealed acute infarct right MCA territory involving the right temporal lobe, insula, and basal ganglia and abrupt occlusion right M1 segment question due to embolus. MRA head/neck revealed bilateral carotid bifurcation patent, moderate stenosis precavernous L-ICA and severe stenosis distal L-VA. UDS negative. 2D echo with EF 38%, grade 2 diastolic dysfunction and hypokinesis mid-apicalanteroseptal myocardium. Carotid dopplers with 1-39% ICA stenosis. BSS done and patient started on dysphagia 2, nectar liquids due to dysphagia. Neurology following for input and recommended resuming ASA and plavix for likely embolic stroke. Loop recorder placed today to rule out cryptogenic cause of stroke  Subjective/Complaints: Pt slept ok "I want foley out" Pt gives hx of enlarged prostate, we discussed possibility of I/O caths No BM for a couple days , refused enema  Objective: Vital Signs: Blood pressure 128/53, pulse 70, temperature 98 F (36.7 C), temperature source Oral, resp. rate 18, weight 157.398 kg (347 lb), SpO2 96 %. No results found. Results for orders placed or performed during the hospital encounter of 02/15/14 (from the past 72 hour(s))  CBC WITH DIFFERENTIAL     Status: Abnormal   Collection Time: 02/16/14  4:55 AM  Result Value Ref Range   WBC 9.8 4.0 - 10.5 K/uL   RBC 4.23 4.22 - 5.81 MIL/uL   Hemoglobin 11.6 (L) 13.0 - 17.0 g/dL   HCT 36.1 (L) 39.0 - 52.0 %   MCV 85.3 78.0 - 100.0 fL   MCH 27.4 26.0 - 34.0 pg   MCHC 32.1 30.0 - 36.0 g/dL   RDW 13.8 11.5 - 15.5 %   Platelets 192 150 - 400 K/uL   Neutrophils Relative % 70 43 - 77 %   Neutro Abs 6.9 1.7 - 7.7 K/uL   Lymphocytes Relative 19 12 - 46 %   Lymphs Abs 1.8 0.7 - 4.0 K/uL   Monocytes Relative 10 3 - 12 %   Monocytes Absolute 1.0 0.1 - 1.0 K/uL   Eosinophils Relative 1 0 - 5 %   Eosinophils Absolute 0.1 0.0 - 0.7 K/uL   Basophils Relative 0 0 - 1 %   Basophils Absolute 0.0 0.0 - 0.1 K/uL  Comprehensive metabolic panel     Status: Abnormal   Collection Time: 02/16/14  4:55 AM  Result Value Ref Range   Sodium 141 137 - 147 mEq/L   Potassium 3.9 3.7 - 5.3 mEq/L   Chloride 104 96 - 112 mEq/L   CO2 23 19 - 32 mEq/L   Glucose, Bld 111 (H) 70 - 99 mg/dL   BUN 12 6 - 23 mg/dL   Creatinine, Ser 1.04 0.50 - 1.35 mg/dL   Calcium 8.6 8.4 - 10.5 mg/dL   Total Protein 7.2 6.0 - 8.3 g/dL   Albumin 3.2 (L) 3.5 - 5.2 g/dL   AST 21 0 - 37 U/L   ALT 16 0 - 53 U/L   Alkaline Phosphatase 73 39 - 117 U/L   Total Bilirubin 0.7 0.3 - 1.2 mg/dL   GFR calc non Af Amer 74 (L) >90 mL/min   GFR calc Af Amer 86 (L) >90 mL/min    Comment: (NOTE) The eGFR has been calculated using the CKD  EPI equation. This calculation has not been validated in all clinical situations. eGFR's persistently <90 mL/min signify possible Chronic Kidney Disease.    Anion gap 14 5 - 15     HEENT: normal Cardio: RRR and no murmur Resp: CTA B/L and unlabored GI: BS positive and NT, ND Extremity:  Pulses positive and No Edema Skin:   Intact Neuro: Alert/Oriented, Cranial Nerve Abnormalities Left central 7, Normal Sensory, Abnormal Motor 0/5 LUE and LLE, Dysarthric and Inattention Musc/Skel:  Other no pain with Left shoulder ROM Gen NAD   Assessment/Plan: 1. 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 Physiatrist is providing close team supervision and 24 hour management of active  medical problems listed below. Physiatrist and rehab team continue to assess barriers to discharge/monitor patient progress toward functional and medical goals. Team conference today please see physician documentation under team conference tab, met with team face-to-face to discuss problems,progress, and goals. Formulized individual treatment plan based on medical history, underlying problem and comorbidities. FIM:                   Comprehension Comprehension Mode: Auditory Comprehension: 3-Understands basic 50 - 74% of the time/requires cueing 25 - 50%  of the time  Expression Expression Mode: Verbal Expression: 4-Expresses basic 75 - 89% of the time/requires cueing 10 - 24% of the time. Needs helper to occlude trach/needs to repeat words.  Social Interaction Social Interaction: 2-Interacts appropriately 25 - 49% of time - Needs frequent redirection.  Problem Solving Problem Solving: 2-Solves basic 25 - 49% of the time - needs direction more than half the time to initiate, plan or complete simple activities  Memory Memory: 3-Recognizes or recalls 50 - 74% of the time/requires cueing 25 - 49% of the time  Medical Problem List and Plan: 1. Functional deficits secondary to Right MCA infarct with left hemiparesis, dysarthria, dysphagia, and visual spatial deficits 2. DVT Prophylaxis/Anticoagulation: Pharmaceutical: Lovenox 3. New onset Headaches/Pain Management: Continue tylenol prn for now. Treat left achilles pain with stretching and ice.  4. Mood: Poor frustration tolerance noted. LCSW to follow for evaluation and support.  5. Neuropsych: This patient is not capable of making decisions on his own behalf. 6. Skin/Wound Care: Routine pressure relief measures.  7. Fluids/Electrolytes/Nutrition: Monitor I/O. Check follow up labs in am. Offer supplements as indicated if intake poor.  8. HTN: Will monitor every 8 hours. Currently controlled of medications (and to avoid  hypotension). will resume medications as indicated. Continue  9. H/o DM type 2?: Hgb A1c- 5.6 . Patient and wife deny any history of diabetes 10 OSA: Continue CPAP (has home machine) whenever asleep.  11. Recent esophagitis/duodenitis due to gastric ulcer: Protonix bid to help promote healing 12. CAD: Has chest wall pain due to poorly Healed sternum. Continue ASA, plavix, lipitor--off imdur and coreg  13. ABLA: Check H/H in am.   LOS (Days) 1 A FACE TO FACE EVALUATION WAS PERFORMED  Tillmon Kisling E 02/16/2014, 8:05 AM

## 2014-02-16 NOTE — Plan of Care (Signed)
Problem: RH SKIN INTEGRITY Goal: RH STG ABLE TO PERFORM INCISION/WOUND CARE W/ASSISTANCE STG Able To Perform Incision/Wound Care With Assistance.  Outcome: Not Applicable Date Met:  95/32/02

## 2014-02-16 NOTE — Evaluation (Signed)
Physical Therapy Assessment and Plan  Patient Details  Name: Gregory Wiley MRN: [id number] Date of Birth: [birth year]  PT Diagnosis: Abnormal posture, Abnormality of gait, Edema, Hemiparesis non-dominant, Impaired cognition, Muscle weakness and Pain in left ankle Rehab Potential: Fair ELOS: 26-28 days   Today's Date: 02/16/2014 PT Individual Time: 1000-1100 PT Individual Time Calculation (min): 60 min    Problem List:  Patient Active Problem List   Diagnosis Date Noted  . Hemiplegia affecting left dominant side 02/16/2014  . 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 Benign neoplasm of colon 02/10/2013  . Special screening for malignant neoplasms, colon 02/10/2013  . OSA on CPAP 01/06/2013  . S/P angioplasty with DES to CFX 07/01/11 07/02/2011  . Sleep apnea, "cant afford C-pap" 07/02/2011  . CAD, RCA PCI '09, 10/11, CABG X 4 5/12 06/30/2011  . Hypertension, B/P has been low this admission 06/30/2011  . Hyperlipidemia 06/30/2011  . Morbid obesity 06/30/2011  . NSTEMI, 06/29/11 06/30/2011  . 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 Ischemic cardiomyopathy, EF 35-40 2D May 2012 06/30/2011  . CHF, acute, mild 06/30/2011    Past Medical History:  Past Medical History  Diagnosis Date  . Coronary artery disease     stent, then CABG 07/20/10  . MI (myocardial infarction)     NSTEMI- last cath 06/2011-Stent to LCX-DES, last nuc 07/30/11 low risk  . Diabetes mellitus   . Hypertension   . Hyperlipidemia   . Obesity (BMI 30-39.9)   . OSA on CPAP     since July 2013  . H/O cardiomyopathy     ischemic, now last echo 07/30/11, EF 38%W  . Diverticulosis   . Internal hemorrhoids   . Tubular adenoma of colon   . Plantar fasciitis of left foot   . Stroke    Past Surgical History:  Past Surgical History  Procedure Laterality Date  . Coronary artery bypass graft  07/20/2010     LIMA-LAD; VG-ACUTE MARG of RCA; Seq VG-distal RCA & then pda  .  Coronary angioplasty with stent placement  07/01/2011    DES-Resolute to native LCX  . Coronary angioplasty with stent placement  12/01/2007    COMPLEX 5 LESION PCI INCLUDING CUTTING BALLOON AND 4 CYPHER DESs  . Hernia repair  2006  . Knee surgery Right   . Hand surgery      to take glass out  . Colonoscopy N/A 02/10/2013    Procedure: COLONOSCOPY;  Surgeon: Ladene Artist, MD;  Location: [facility] ENDOSCOPY;  Service: Endoscopy;  Laterality: N/A;  . Retinal detachment surgery    . Loop recorder implant  02/15/2014    MDT LINQ implanted by Dr Rayann Heman for cryptogenic stroke  . Tee without cardioversion N/A 02/15/2014    Procedure: TRANSESOPHAGEAL ECHOCARDIOGRAM (TEE);  Surgeon: Josue Hector, MD;  Location: [facility] ENDOSCOPY;  Service: Cardiovascular;  Laterality: N/A;    Assessment & Plan Clinical Impression: Gregory Wiley is a 63 y.o. male with history of CAD, HTN, OSA, esophagitis/duodenitis, who was admitted on 02/12/14 with left sided weakness with unsteady gait/fall, left facial droop and slurred speech. Patient has been off ASA/plavix due to recent bleeding ulcer. Family reports non-compliant with medications as well as CPAP use.Patient with progressive weakness in ED. CT head negative. MRI brain revealed acute infarct right MCA territory involving the right temporal lobe, insula, and basal ganglia and abrupt occlusion right M1 segment question due to embolus. MRA head/neck revealed bilateral carotid bifurcation patent,  moderate stenosis precavernous L-ICA and severe stenosis distal L-VA. UDS negative. 2D echo with EF 19%, grade 2 diastolic dysfunction and hypokinesis mid-apicalanteroseptal myocardium. Carotid dopplers with 1-39% ICA stenosis. BSS done and patient started on dysphagia 2, nectar liquids due to dysphagia. Neurology following for input and recommended resuming ASA and plavix for likely embolic stroke. Loop recorder placed today to rule out cryptogenic cause of stroke. Patient  transferred to CIR on 02/15/2014 .   Patient currently requires total A x 2-3 with mobility secondary to muscle weakness and pain in L ankle, decreased cardiorespiratoy endurance, impaired timing and sequencing, abnormal tone and unbalanced muscle activation, decreased attention to left and left side neglect and decreased attention, decreased awareness, decreased problem solving, decreased safety awareness and decreased memory.  Prior to hospitalization, patient was independent  with mobility and lived with Spouse in a House home.  Home access is 2 and small thresholdStairs to enter.  Patient will benefit from skilled PT intervention to maximize safe functional mobility, minimize fall risk and decrease caregiver burden for planned discharge to SNF.  Anticipate patient will benefit from follow up [place] at discharge.  PT - End of Session Activity Tolerance: Decreased this session;Tolerates 10 - 20 min activity with multiple rests Endurance Deficit: Yes PT Assessment Rehab Potential (ACUTE/IP ONLY): Fair Barriers to Discharge: Inaccessible home environment;Decreased caregiver support PT Patient demonstrates impairments in the following area(s): Balance;Behavior;Edema;Endurance;Motor;Nutrition;Pain;Perception;Safety PT Transfers Functional Problem(s): Bed Mobility;Bed to Chair;Car PT Locomotion Functional Problem(s): Ambulation;Wheelchair Mobility;Stairs PT Plan PT Intensity: Minimum of 1-2 x/day ,45 to 90 minutes PT Frequency: 5 out of 7 days PT Duration Estimated Length of Stay: 26-28 days PT Treatment/Interventions: Ambulation/gait training;Balance/vestibular training;Cognitive remediation/compensation;Discharge planning;Disease management/prevention;DME/adaptive equipment instruction;Functional mobility training;Neuromuscular re-education;Pain management;Patient/family education;Psychosocial support;Stair training;Therapeutic Activities;Therapeutic Exercise;UE/LE Strength taining/ROM;UE/LE  Coordination activities;Wheelchair propulsion/positioning PT Transfers Anticipated Outcome(s): max A x1 PT Locomotion Anticipated Outcome(s): max A x1 PT Recommendation Follow Up Recommendations: Skilled nursing facility;24 hour supervision/assistance Patient destination: [place] (SNF) Equipment Recommended: To be determined Equipment Details: Based on discharge destination  Skilled Therapeutic Intervention Pt received sitting EOB, handoff from OT. Patient with increased lethargy and 10/10 L ankle pain with patient unable to tolerate WB. Unable to assess standing, gait, and stairs due to pain. Pt able to maintain sitting balance EOB with supervision and withstand multidirectional perturbations without LOB. Pt performed slide board transfer to w/c with +3 for safety. Pt propelled w/c using R hemi technique with max cues for sequencing and supervision-max A. Pt requires total cues for scanning to L. Pt's wife reports she will only be able to provide supervision at home and voiced concern over stress of patient's hospitalization resulting in flare up of MS for wife. Recommend nursing to use [employer] for transfers at this time. Discussed with patient and wife falls risk, safety within room, and focus of therapy during stay. Discussed possible LOS, goals, and f/u therapy. Pt left sitting in w/c with wife present, needs within reach.   PT Evaluation Precautions/Restrictions Precautions Precautions: Fall Precaution Comments: L neglect, L hemiplegia Restrictions Weight Bearing Restrictions: No General Chart Reviewed: Yes Family/Caregiver Present: Yes (wife [name]) Vital Signs Pain Pain Assessment Pain Assessment: 0-10 Pain Score: 10-Worst pain ever Pain Type: Acute pain Pain Location: Ankle Pain Orientation: Left Pain Descriptors / Indicators: Throbbing Pain Onset: With Activity (PROM, AROM, L knee extension) Pain Intervention(s): RN made aware;Repositioned;Rest Home  Living/Prior Functioning Home Living Available Help at Discharge: Family;Available 24 hours/day (wife available for 24/7 S, no assist, daughter available intermittently) Type  of Home: House Home Access: Stairs to enter [employer] of Steps: 2 and small threshold Entrance Stairs-Rails: None Home Layout: One level Additional Comments: wife has MS  Lives With: Spouse Prior Function Level of Independence: Independent with gait;Independent with transfers  Able to Take Stairs?: Yes Driving: Yes 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  He was able to identify the color of his wife's shirt, who was sitting in front of him to the left side.  Decreased smoothness of tracking throughout however pt able to correctly identify therapist's fingers in all quadrants.  Perception Comments: Pt needed max instructional cueing to look left of midline for locating grooming items.    Cognition Overall Cognitive Status: Impaired/Different from baseline Arousal/Alertness: Lethargic Orientation Level: Oriented to place;Oriented to situation;Oriented to person;Disoriented to time Attention: Sustained Focused Attention: Appears intact Sustained Attention: Impaired Sustained Attention Impairment: Verbal basic;Functional basic Memory: Impaired Memory Impairment: Storage deficit;Retrieval deficit;Decreased recall of new information Awareness: Impaired Awareness Impairment: Intellectual impairment Problem Solving: Impaired Problem Solving Impairment: Functional basic Executive Function: Decision Making;Initiating;Self  Monitoring;Self Correcting Decision Making: Impaired Decision Making Impairment: Functional basic Initiating: Impaired Initiating Impairment: Functional basic Self Monitoring: Impaired Self Monitoring Impairment: Functional basic Self Correcting: Impaired Self Correcting Impairment: Functional basic Behaviors: Impulsive Safety/Judgment: Impaired Comments: Pt with decreased awareness of visual deficits on evaluation.  He needed mod questioning cues to identify deficits in the left side. Sensation Sensation Light Touch: Impaired Detail Light Touch Impaired Details: Impaired LUE Stereognosis: Not tested Hot/Cold: Not tested Proprioception: Not tested Additional Comments: Pt unable to detect light touch consistently from shoulder to fingers.   Coordination Gross Motor Movements are Fluid and Coordinated: No Fine Motor Movements are Fluid and Coordinated: No Coordination and Movement Description: Pt is currently Brunnstrum stage I in the left arm and hand.  No noted movement observed to command.  Motor  Motor Motor: Hemiplegia Motor - Skilled Clinical Observations: Dense L sided hemiplegia, patient unable to tolerate WB through L ankle due to 10/10 pain  Mobility Bed Mobility Bed Mobility: Not assessed Transfers Transfers: Yes Lateral/Scoot Transfers: With slide board;1: +2 Total assist (+3 assist for safety) Lateral/Scoot Transfer Details: Verbal cues for precautions/safety;Verbal cues for sequencing;Verbal cues for technique;Manual facilitation for weight shifting;Manual facilitation for placement;Tactile cues for weight shifting;Tactile cues for sequencing Lateral/Scoot Transfer Details (indicate cue type and reason): patient with decreased ability to bear weight through L ankle due to 10/10 pain Locomotion  Ambulation Ambulation: No (patient unable to tolerate WB through L ankle due to 10/10 pain) Gait Gait: No (patient unable to tolerate WB through L ankle due to 10/10  pain) Stairs / Additional Locomotion Stairs: No (patient unable to tolerate WB through L ankle due to 10/10 pain) Ramp: Not tested (comment) Curb: Not tested (comment) Wheelchair Mobility Wheelchair Mobility: Yes Wheelchair Assistance: 2: Max Chiropodist Details: Verbal cues for technique;Verbal cues for precautions/safety;Verbal cues for sequencing;Tactile cues for sequencing;Tactile cues for placement Wheelchair Propulsion: Right upper extremity;Right lower extremity Wheelchair Parts Management: Needs assistance Distance: 150  Trunk/Postural Assessment  Cervical Assessment Cervical Assessment: Exceptions to [facility] (keeps head turned to the right side) Thoracic Assessment Thoracic Assessment: Within Functional Limits Lumbar Assessment Lumbar Assessment:  Within Functional Limits (wife reports hx of back pain) Postural Control Postural Control: Deficits on evaluation Righting Reactions: Pt with slight increased weightshift over the left hip with left trunk elongation.   Protective Responses: delayed/impaired  Balance Balance Balance Assessed: Yes Static Sitting Balance Static Sitting - Balance Support: Feet supported;Right upper extremity supported Static Sitting - Level of Assistance: 5: Stand by assistance Dynamic Sitting Balance Dynamic Sitting - Balance Support: Feet supported;Right upper extremity supported Extremity Assessment  RUE Assessment RUE Assessment: Within Functional Limits LUE Assessment LUE Assessment: Exceptions to [facility] LUE Strength LUE Overall Strength Comments: Pt with Brunnstrum stage I movement in the left arm and hand.  PROM WFLs at the elbow and digits.  Shoulder flexion PROM 0-120 degrees with pt reporting increased pain.  Inferior subluxation noted.  Pt also with increased edema noted in the left hand.  RLE Assessment RLE Assessment: Within Functional Limits LLE Assessment LLE Assessment: Exceptions to [facility] LLE Strength LLE Overall  Strength: Deficits LLE Overall Strength Comments: No active movement noted throughout, 10/10 pain with PROM L ankle DF and PROM L knee extension (at ankle)  FIM:  FIM - Control and instrumentation engineer Devices: Sliding board;Arm rests Bed/Chair Transfer: 1: Two helpers FIM - Locomotion: Wheelchair Distance: 150 Locomotion: Wheelchair: 2: Travels 150 ft or more: maneuvers on rugs and over door sills with maximal assistance (Pt: 25 - 49%) FIM - Locomotion: Ambulation Locomotion: Ambulation: 0: Activity did not occur FIM - Locomotion: Stairs Locomotion: Stairs: 0: Activity did not occur   Refer to Care Plan for Long Term Goals  Recommendations for other services: None  Discharge Criteria: Patient will be discharged from PT if patient refuses treatment 3 consecutive times without medical reason, if treatment goals not met, if there is a change in medical status, if patient makes no progress towards goals or if patient is discharged from hospital.  The above assessment, treatment plan, treatment alternatives and goals were discussed and mutually agreed upon: by patient and by family  Laretta Alstrom 02/16/2014, 12:13 PM

## 2014-02-16 NOTE — Evaluation (Signed)
Speech Language Pathology Assessment and Plan  Patient Details  Name: Gregory Wiley MRN: [id number] Date of Birth: 1950/06/13  SLP Diagnosis: Cognitive Impairments;Dysphagia;Dysarthria  Rehab Potential: Fair ELOS: 4 weeks    Today's Date: 02/16/2014 SLP Individual Time: 4098-1191 SLP Individual Time Calculation (min): 60 min   Problem List:  Patient Active Problem List   Diagnosis Date Noted  . Hemiplegia affecting left dominant side 02/16/2014  . 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 Benign neoplasm of colon 02/10/2013  . Special screening for malignant neoplasms, colon 02/10/2013  . OSA on CPAP 01/06/2013  . S/P angioplasty with DES to CFX 07/01/11 07/02/2011  . Sleep apnea, "cant afford C-pap" 07/02/2011  . CAD, RCA PCI '09, 10/11, CABG X 4 5/12 06/30/2011  . Hypertension, B/P has been low this admission 06/30/2011  . Hyperlipidemia 06/30/2011  . Morbid obesity 06/30/2011  . NSTEMI, 06/29/11 06/30/2011  . 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 Ischemic cardiomyopathy, EF 35-40 2D May 2012 06/30/2011  . CHF, acute, mild 06/30/2011   Past Medical History:  Past Medical History  Diagnosis Date  . Coronary artery disease     stent, then CABG 07/20/10  . MI (myocardial infarction)     NSTEMI- last cath 06/2011-Stent to LCX-DES, last nuc 07/30/11 low risk  . Diabetes mellitus   . Hypertension   . Hyperlipidemia   . Obesity (BMI 30-39.9)   . OSA on CPAP     since July 2013  . H/O cardiomyopathy     ischemic, now last echo 07/30/11, EF 38%W  . Diverticulosis   . Internal hemorrhoids   . Tubular adenoma of colon   . Plantar fasciitis of left foot   . Stroke    Past Surgical History:  Past Surgical History  Procedure Laterality Date  . Coronary artery bypass graft  07/20/2010     LIMA-LAD; VG-ACUTE MARG of RCA; Seq VG-distal RCA & then pda  . Coronary angioplasty with stent placement  07/01/2011    DES-Resolute to native LCX   . Coronary angioplasty with stent placement  12/01/2007    COMPLEX 5 LESION PCI INCLUDING CUTTING BALLOON AND 4 CYPHER DESs  . Hernia repair  2006  . Knee surgery Right   . Hand surgery      to take glass out  . Colonoscopy N/A 02/10/2013    Procedure: COLONOSCOPY;  Surgeon: Gregory Artist, MD;  Location: [facility] ENDOSCOPY;  Service: Endoscopy;  Laterality: N/A;  . Retinal detachment surgery    . Loop recorder implant  02/15/2014    MDT LINQ implanted by Dr Gregory Wiley for cryptogenic stroke  . Tee without cardioversion N/A 02/15/2014    Procedure: TRANSESOPHAGEAL ECHOCARDIOGRAM (TEE);  Surgeon: Gregory Hector, MD;  Location: [facility] ENDOSCOPY;  Service: Cardiovascular;  Laterality: N/A;    Assessment / Plan / Recommendation Clinical Impression Gregory Wiley is a 63 year old male with history of CAD, HTN, OSA, esophagitis/duodenitis, who was admitted on 02/12/14 with left sided weakness with unsteady gait/fall, left facial droop and slurred speech. Patient has been off ASA/plavix due to recent bleeding ulcer. Family reports non-compliant with medications as well as CPAP use.  Patient with progressive weakness in ED; CT head negative; MRI brain revealed acute infarct right MCA territory involving the right temporal lobe, insula, and basal ganglia and abrupt occlusion right M1 segment question due to embolus. Bedside swallow evaluation done 02/13/14 and patient started on dysphagia 2, nectar-thick liquid diet. Cognitive-linguistic evaluation  revealed moderate dysarthria and cognitive deficits with difficulty attending to task as well as left inattention. PT/OT evaluations done and CIR recommended by MD and rehab team. Patient admitted 02/15/14 for comprehensive rehab program.   Orders received; bedside swallow and cognitive-linguistic evaluations completed.  Patient continues to demonstrate left sided, oral weakness resulting in moderate dysarthria and oral dysphagia.  Suspect pharyngeal dysphagia is also present and  continue to recommend Dys.2 textures and nectar-thick liquids with full supervision due to cognitive deficits.  Cognitive-linguistic evaluation revealed moderately severe impairments characterized by poor sustained attention, left inattention, recall of new information, intellectual awareness of deficits with reduced ability to safely solve problems related to basic self-care tasks.  As a result, patient requires skilled SLP services to address deficits to maximize functional independence and reduce overall burden of care prior to discharge with anticipated need for 24/7 assist.     Skilled Therapeutic Interventions          Bedside swallow and cognitive-linguistic evaluations completed with results and recommendations reviewed with patient and family.  SLP also initiated skilled treatment with Max multimodal cues to sustain attention to self-feeding, as well as Max verbal cues to recall and utilize recommended safe swallow strategies.  Patient consumed Dys. 2 textures and nectar-thick liquids via straw with no overt s/s with clinician cues.     SLP Assessment  Patient will need skilled Speech Lanaguage Pathology Services during CIR admission    Recommendations  Diet Recommendations: Dysphagia 2 (Fine chop);Nectar-thick liquid Liquid Administration via: Straw (placed on right ) Medication Administration: Crushed with puree Supervision: Patient able to self feed;Full supervision/cueing for compensatory strategies Compensations: Slow rate;Small sips/bites;Check for pocketing;Check for anterior loss;Follow solids with liquid Postural Changes and/or Swallow Maneuvers: Seated upright 90 degrees;Upright 30-60 min after meal;Out of bed for meals Oral Care Recommendations: Oral care before and after PO Patient destination: [place] (SNF) Follow up Recommendations: 24 hour supervision/assistance;Skilled Nursing facility Equipment Recommended: To be determined    SLP Frequency 5 out of 7  days   SLP Treatment/Interventions Cognitive remediation/compensation;Cueing hierarchy;Dysphagia/aspiration precaution training;Environmental controls;Functional tasks;Internal/external aids;Patient/family education;Oral motor exercises;Speech/Language facilitation;Therapeutic Activities    Pain Pain Assessment Pain Assessment: No/denies pain Prior Functioning Cognitive/Linguistic Baseline: Within functional limits Type of Home: House  Lives With: Spouse Available Help at Discharge: Family;Available 24 hours/day Vocation: Part time employment  Short Term Goals: Week 1: SLP Short Term Goal 1 (Week 1): Patien will consume Dys. 2 textures and nectar-thick liquids via straw with Mod cues for recall and use of safe swallow strategies to miimize overt s/s of aspiration SLP Short Term Goal 2 (Week 1): Patient will recall and scan to the left of midline to locate items as needed, during basic functional tasks with Mod mulitmodal cues  SLP Short Term Goal 3 (Week 1): Patient will produce intelligible speech at the phrase-sentence level of vebral expression with Min vebral cues to utilize speech intelligibilty strategies SLP Short Term Goal 4 (Week 1): Patient will label 2 physical and 2 cognitive deficits with Mod question cues SLP Short Term Goal 5 (Week 1): Patient will sustain attention to basic tasks for 1 minute with Mod verbal cues for redirections  See FIM for current functional status Refer to Care Plan for Long Term Goals  Recommendations for other services: None  Discharge Criteria: Patient will be discharged from SLP if patient refuses treatment 3 consecutive times without medical reason, if treatment goals not met, if there is a change in medical status, if patient makes  no progress towards goals or if patient is discharged from hospital.  The above assessment, treatment plan, treatment alternatives and goals were discussed and mutually agreed upon: by patient and by family  Carmelia Roller., CCC-SLP [place] 02/16/2014, 3:57 PM

## 2014-02-16 NOTE — Progress Notes (Signed)
Social Work Assessment and Plan Social Work Assessment and Plan  Patient Details  Name: Gregory Wiley MRN: [id number] Date of Birth: 18-Sep-1950  Today's Date: 02/16/2014  Problem List:  Patient Active Problem List   Diagnosis Date Noted  . Hemiplegia affecting left dominant side 02/16/2014  . 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 Benign neoplasm of colon 02/10/2013  . Special screening for malignant neoplasms, colon 02/10/2013  . OSA on CPAP 01/06/2013  . S/P angioplasty with DES to CFX 07/01/11 07/02/2011  . Sleep apnea, "cant afford C-pap" 07/02/2011  . CAD, RCA PCI '09, 10/11, CABG X 4 5/12 06/30/2011  . Hypertension, B/P has been low this admission 06/30/2011  . Hyperlipidemia 06/30/2011  . Morbid obesity 06/30/2011  . NSTEMI, 06/29/11 06/30/2011  . 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 Ischemic cardiomyopathy, EF 35-40 2D May 2012 06/30/2011  . CHF, acute, mild 06/30/2011   Past Medical History:  Past Medical History  Diagnosis Date  . Coronary artery disease     stent, then CABG 07/20/10  . MI (myocardial infarction)     NSTEMI- last cath 06/2011-Stent to LCX-DES, last nuc 07/30/11 low risk  . Diabetes mellitus   . Hypertension   . Hyperlipidemia   . Obesity (BMI 30-39.9)   . OSA on CPAP     since July 2013  . H/O cardiomyopathy     ischemic, now last echo 07/30/11, EF 38%W  . Diverticulosis   . Internal hemorrhoids   . Tubular adenoma of colon   . Plantar fasciitis of left foot   . Stroke    Past Surgical History:  Past Surgical History  Procedure Laterality Date  . Coronary artery bypass graft  07/20/2010     LIMA-LAD; VG-ACUTE MARG of RCA; Seq VG-distal RCA & then pda  . Coronary angioplasty with stent placement  07/01/2011    DES-Resolute to native LCX  . Coronary angioplasty with stent placement  12/01/2007    COMPLEX 5 LESION PCI INCLUDING CUTTING BALLOON AND 4 CYPHER DESs  . Hernia repair  2006  . Knee  surgery Right   . Hand surgery      to take glass out  . Colonoscopy N/A 02/10/2013    Procedure: COLONOSCOPY;  Surgeon: Ladene Artist, MD;  Location: [facility] ENDOSCOPY;  Service: Endoscopy;  Laterality: N/A;  . Retinal detachment surgery    . Loop recorder implant  02/15/2014    MDT LINQ implanted by Dr Rayann Heman for cryptogenic stroke  . Tee without cardioversion N/A 02/15/2014    Procedure: TRANSESOPHAGEAL ECHOCARDIOGRAM (TEE);  Surgeon: Josue Hector, MD;  Location: [facility]COPY;  Service: Cardiovascular;  Laterality: N/A;   Social History:  reports that he has quit smoking. He has never used smokeless tobacco. He reports that he drinks alcohol. He reports that he does not use illicit drugs.  Family / Support Systems Marital Status: Married How Long?: 19 years Patient Roles: Spouse, Parent, Psychologist, occupational, Other (Comment) (Employee-part time) Spouse/Significant Other: Gregory Wiley [phone] Children: Gregory Wiley-daughter  [phone] Other Supports: Three local children Anticipated Caregiver: Does not have one Ability/Limitations of Caregiver: Wife has MS and is disabled and can only provide supervision level Caregiver Availability: Other (Comment) (Available but can not provide physical care) Family Dynamics: This is a second marriage for both and both have children from their first Gregory Wiley has three and she has two.  They have three local children, who are involved and supportive.  They have many supportive friends  and co-workers.  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   Abuse/Neglect Physical Abuse: Denies Verbal Abuse: Denies Sexual Abuse: Denies Exploitation of patient/patient's resources: Denies Self-Neglect: Denies  Emotional Status Pt's affect, behavior adn adjustment status: Pt is very tired from therapies and the pain in his left ankle. He has awlays been independent and cared for his wife-looked after her.   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 Recent Psychosocial Issues: Other health issues-recent GI surgery Pyschiatric History: No history unable to complete depression screen due to sleeping-will try later.  Pt will benefit from Neuro-psych eval while he is here for coping and testing. Substance Abuse History: No issues  Patient / Family Perceptions, Expectations & Goals Pt/Family understanding of illness & functional limitations: Wife is able to explain his stroke and deficits, she is hopeful he will do well here.  She has been here to observe in therapies and try to help if she can.  She talks with the MD and feel sher questions and concerns are being addressed. Premorbid pt/family roles/activities: Husband, father, grandfatehr, Gregory Wiley, employee, church member, etc Anticipated changes in roles/activities/participation: resume Pt/family expectations/goals: Wife states: " I want to be able to take him home from here bu know it is a long road to recover."  Pt states: " I want to get better, but feel I am."  [employer]: None Premorbid Home Care/DME Agencies: Other (Comment) (Has CPAP at home) Transportation available at discharge: Wife and children Resource referrals recommended: Neuropsychology, Support group (specify)  Discharge Planning Living Arrangements: Spouse/significant other Support Systems: Spouse/significant other,  Children, Friends/neighbors, Church/faith community Type of Residence: Private residence Insurance Resources: Multimedia programmer (specify) Sports administrator) Museum/gallery curator Resources: Employment, Other (Comment), Family Support (Pension from [employer]) Financial Screen Referred: No Living Expenses: Lives with family Money Management: Patient, Spouse Does the patient have any problems obtaining your medications?: No Home Management: Wife and Pt Patient/Family Preliminary Plans: Due to wife's inabiity to provide any physical care pt will probably need to go to a NH for short time until he can return home.  Will assess pt more when more awake. Will work on realisitc discharge plan. Social Work Anticipated Follow Up Needs: SNF, Support Group  Clinical Impression Pleasant wife who is trying to process all that has happened to pt and learn as much as she can about his diagnosis.  Supportive family but have jobs and families, so can not assist much. Pt has had a severe CVA and will probably require NHP upon discharge from here to get longer time to recover and more rehab.  Would benefit from Neuro-psych eval while here. Work on discharge plan and provide support to wife.  She is aware to take care of herself due to her MS.  [name] 02/16/2014, 3:02 PM

## 2014-02-16 NOTE — Progress Notes (Signed)
Patient information reviewed and entered into eRehab system by Elya Diloreto, RN, CRRN, PPS Coordinator.  Information including medical coding and functional independence measure will be reviewed and updated through discharge.    

## 2014-02-16 NOTE — Care Management Note (Signed)
[facility] Individual Statement of Services  Patient Name:  Gregory Wiley  Date:  02/16/2014  Welcome to the [facility].  Our goal is to provide you with an individualized program based on your diagnosis and situation, designed to meet your specific needs.  With this comprehensive rehabilitation program, you will be expected to participate in at least 3 hours of rehabilitation therapies Monday-Friday, with modified therapy programming on the weekends.  Your rehabilitation program will include the following services:  Physical Therapy (PT), Occupational Therapy (OT), Speech Therapy (ST), 24 hour per day rehabilitation nursing, Therapeutic Recreaction (TR), Neuropsychology, Case Management (Social Worker), Rehabilitation Medicine, Nutrition Services and Pharmacy Services  Weekly team conferences will be held on Wednesday to discuss your progress.  Your Social Worker will talk with you frequently to get your input and to update you on team discussions.  Team conferences with you and your family in attendance may also be held.  Expected length of stay: 4 weeks  Overall anticipated outcome: max assist  Depending on your progress and recovery, your program may change. Your Social Worker will coordinate services and will keep you informed of any changes. Your Social Worker's name and contact numbers are listed  below.  The following services may also be recommended but are not provided by the [facility] will be made to provide these services after discharge if needed.  Arrangements include referral to agencies that provide these services.  Your insurance has been verified to be:  [facility] Your primary doctor is:  Jeneen Rinks [place]  Pertinent information will be shared with your doctor and your insurance  company.  Social Worker:  Ovidio Kin, [place] or ([phone]  Information discussed with and copy given to patient by: [name], 02/16/2014, 9:38 AM

## 2014-02-16 NOTE — Patient Care Conference (Signed)
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 Hemiplegia affecting left dominant side 02/16/2014  . 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 Benign neoplasm of colon 02/10/2013  . Special screening for malignant neoplasms, colon 02/10/2013  . OSA on CPAP 01/06/2013  . S/P angioplasty with DES to CFX 07/01/11 07/02/2011  . Sleep apnea, "cant afford C-pap" 07/02/2011  . CAD, RCA PCI '09, 10/11, CABG X 4 5/12 06/30/2011  . Hypertension, B/P has been low this admission 06/30/2011  . Hyperlipidemia 06/30/2011  . Morbid obesity 06/30/2011  . NSTEMI, 06/29/11 06/30/2011  . 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 Ischemic cardiomyopathy, EF 35-40 2D May 2012 06/30/2011  . CHF, acute, mild 06/30/2011    Expected Discharge Date:    Team Members Present: Physician leading conference: Dr. Alysia Penna Social Worker Present: Ovidio Kin, LCSW Nurse Present: Heather Roberts, RN PT Present: Carney Living, PT OT Present: Benay Pillow, OT SLP Present: Gunnar Fusi, SLP PPS Coordinator present : Daiva Nakayama, RN, CRRN     Current Status/Progress Goal Weekly Team Focus  Medical   Severe left neglect, left hemiparesis left arm dominant, dysphagia, Morbid obesity  Maintain medical stability during inpatient rehabilitation stay  Improve bowel  constipation, discontinue Foley with goal continent voiding   Bowel/Bladder   Foley removed 12/2-awaiting void; continent of bowel per report- LBM 11/28  Continent of bowel and bladder  Offer bathroom PRN, assess for signs and symptoms of constipation   Swallow/Nutrition/ Hydration   Eval pending          ADL's   total +2 for for all bed mobility, bathing, and dressing.  Pt needs total assist +2 to 3 for transfer with the sliding board.  Max assist for transfers, toileting, and LB selfcare sit to stand.  Min assist to supervision for UB selfcare  selfcare training, positioning education, balance re-education, transfer training, pt/family education, DME education   Mobility   total A x 2 to 3 persons for bed mobility and slide board transfers, S-max A w/c mobility using R hemi technique  max A x 1 except S for w/c mobility  pain management LLE, L NMR, balance training, attending to L, functional transfers, w/c mobility, pt/family education   Communication   Eval pending          Safety/Cognition/ Behavioral Observations  Eval pending          Pain   Denies  </=3  Assess pain qshift   Skin   Incision to left chest from loop recorder-stained; dry skin; blanchable redness to buttocks  No new skin breakdown  Assess skin qshift      *See Care Plan and progress notes for long and short-term goals.  Barriers to Discharge: Heavy physical assist, see above    Possible Resolutions to Barriers:  Continue and initiate rehabilitation program  Discharge Planning/Teaching Needs:    Wife has MS and is not able to provide any physical care at discharge. Probably will need NHP upon discharge to get more rehab and longer for recovery to take place     Team Discussion:  Severe CVA-no movement in affected side-neglect.  D/C foley today, see if voids. Left ankle pain limits him in therapies. Reduce burden of care to plus one.  Revisions to Treatment Plan:  New eval   Continued Need for Acute  Rehabilitation Level of Care: The patient requires daily medical management by a physician with specialized training in physical medicine and rehabilitation for the following conditions: Daily direction of a multidisciplinary physical rehabilitation program to ensure safe treatment while eliciting the highest outcome that is of practical value to the patient.: Yes Daily medical management of patient stability for increased activity during participation in an intensive rehabilitation regime.: Yes Daily analysis of laboratory values and/or radiology reports with any subsequent need for medication adjustment of medical intervention for : Neurological problems;Other  Gregory Wiley, Gregory Wiley 02/17/2014, 8:40 AM

## 2014-02-16 NOTE — Plan of Care (Signed)
Problem: RH BOWEL ELIMINATION Goal: RH STG MANAGE BOWEL WITH ASSISTANCE STG Manage Bowel with Mod Assistance.  Outcome: Progressing Goal: RH STG MANAGE BOWEL W/MEDICATION W/ASSISTANCE STG Manage Bowel with Medication with Mod Assistance.  Outcome: Progressing Goal: RH STG MANAGE BOWEL W/EQUIPMENT W/ASSISTANCE STG Manage Bowel With Equipment With Mod Assistance  Outcome: Progressing  Problem: RH BLADDER ELIMINATION Goal: RH STG MANAGE BLADDER WITH ASSISTANCE STG Manage Bladder With Mod Assistance  Outcome: Progressing Goal: RH STG MANAGE BLADDER WITH MEDICATION WITH ASSISTANCE STG Manage Bladder With Medication With Mod Assistance.  Outcome: Progressing Goal: RH STG MANAGE BLADDER WITH EQUIPMENT WITH ASSISTANCE STG Manage Bladder With Equipment With Mod Assistance  Outcome: Progressing  Problem: RH SKIN INTEGRITY Goal: RH STG SKIN FREE OF INFECTION/BREAKDOWN Min Assist Outcome: Progressing Goal: RH STG MAINTAIN SKIN INTEGRITY WITH ASSISTANCE STG Maintain Skin Integrity With [place].  Outcome: Progressing  Problem: RH SAFETY Goal: RH STG ADHERE TO SAFETY PRECAUTIONS W/ASSISTANCE/DEVICE STG Adhere to Safety Precautions With Min Assistance/Device.  Outcome: Progressing Goal: RH STG DECREASED RISK OF FALL WITH ASSISTANCE STG Decreased Risk of Fall With [employer].  Outcome: Progressing  Problem: RH COGNITION-NURSING Goal: RH STG USES MEMORY AIDS/STRATEGIES W/ASSIST TO PROBLEM SOLVE STG Uses Memory Aids/Strategies With Mod Assistance to Problem Solve.  Outcome: Progressing Goal: RH STG ANTICIPATES NEEDS/CALLS FOR ASSIST W/ASSIST/CUES STG Anticipates Needs/Calls for Assist With Mod Assistance/Cues.  Outcome: Progressing  Problem: RH PAIN MANAGEMENT Goal: RH STG PAIN MANAGED AT OR BELOW PT'S PAIN GOAL < 4 Outcome: Progressing

## 2014-02-17 ENCOUNTER — Inpatient Hospital Stay (HOSPITAL_COMMUNITY): Payer: 59 | Admitting: *Deleted

## 2014-02-17 ENCOUNTER — Inpatient Hospital Stay (HOSPITAL_COMMUNITY): Payer: 59 | Admitting: Speech Pathology

## 2014-02-17 ENCOUNTER — Inpatient Hospital Stay (HOSPITAL_COMMUNITY): Payer: 59 | Admitting: Physical Therapy

## 2014-02-17 DIAGNOSIS — G8192 Hemiplegia, unspecified affecting left dominant side: Secondary | ICD-10-CM

## 2014-02-17 DIAGNOSIS — M10072 Idiopathic gout, left ankle and foot: Secondary | ICD-10-CM

## 2014-02-17 LAB — GLUCOSE, CAPILLARY
Glucose-Capillary: 138 mg/dL — ABNORMAL HIGH (ref 70–99)
Glucose-Capillary: 126 mg/dL — ABNORMAL HIGH (ref 70–99)
Glucose-Capillary: 137 mg/dL — ABNORMAL HIGH (ref 70–99)

## 2014-02-17 MED ORDER — PREDNISONE (PAK) 5 MG PO TABS
10.0000 mg | ORAL_TABLET | Freq: Every evening | ORAL | Status: AC
  Administered 2014-02-17: 10 mg via ORAL

## 2014-02-17 MED ORDER — PREDNISONE (PAK) 5 MG PO TABS
5.0000 mg | ORAL_TABLET | ORAL | Status: AC
  Administered 2014-02-17: 5 mg via ORAL

## 2014-02-17 MED ORDER — PREDNISONE (PAK) 5 MG PO TABS
5.0000 mg | ORAL_TABLET | Freq: Three times a day (TID) | ORAL | Status: AC
  Administered 2014-02-18 (×3): 5 mg via ORAL

## 2014-02-17 MED ORDER — PREDNISONE (PAK) 5 MG PO TABS
5.0000 mg | ORAL_TABLET | Freq: Four times a day (QID) | ORAL | Status: AC
  Administered 2014-02-19 – 2014-02-22 (×10): 5 mg via ORAL

## 2014-02-17 MED ORDER — PREDNISONE (PAK) 5 MG PO TABS
10.0000 mg | ORAL_TABLET | Freq: Every evening | ORAL | Status: AC
  Administered 2014-02-18: 10 mg via ORAL

## 2014-02-17 MED ORDER — PREDNISONE (PAK) 5 MG PO TABS
10.0000 mg | ORAL_TABLET | Freq: Every morning | ORAL | Status: AC
  Administered 2014-02-17: 10 mg via ORAL
  Filled 2014-02-17: qty 21

## 2014-02-17 NOTE — IPOC Note (Addendum)
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 Active Problems:   Morbid obesity   Hemiplegia affecting left dominant side     Functional Problem List: Nursing Behavior, Pain, Safety  PT Balance, Behavior, Edema, Endurance, Motor, Nutrition, Pain, Perception, Safety  OT Balance, Cognition, Edema, Endurance, Motor, Pain, Perception, Safety, Sensory, Vision  SLP Cognition, Nutrition, Linguistic  TR Activity tolerance, functional mobility, balance, cognition, safety, pain       Basic ADL's: OT Eating, Grooming, Bathing, Dressing, Toileting     Advanced  ADL's: OT       Transfers: PT Bed Mobility, Bed to Chair, Teacher, early years/pre, Tub/Shower     Locomotion: PT Ambulation, Emergency planning/management officer, Stairs     Additional Impairments: OT Fuctional Use of Upper Extremity  SLP Swallowing, Communication, Social Cognition expression Social Interaction, Problem Solving, Memory, Attention, Awareness  TR      Anticipated Outcomes Item Anticipated Outcome  Self Feeding supervision  Swallowing  Min assist    Basic self-care  max assist  Toileting  max assist   Bathroom Transfers max assist  Bowel/Bladder  Total Assist with bladder; Mod with bowel  Transfers  max A x1  Locomotion  max A x1  Communication  Min assist   Cognition  Min assist   Pain  <4  Safety/Judgment  Min Assist   Therapy Plan: PT Intensity: Minimum of 1-2 x/day ,45 to 90 minutes PT Frequency: 5 out of 7 days PT Duration Estimated Length of Stay: 26-28 days OT Intensity: Minimum of 1-2 x/day, 45 to 90 minutes OT Frequency: 5 out of 7 days OT Duration/Estimated Length of Stay: 26-28 days SLP Intensity: Minumum of 1-2 x/day, 30 to 90 minutes SLP Frequency: 5 out of 7 days SLP Duration/Estimated Length of Stay: 4 weeks  TR  Duration/ELOS:  3 weeks TR Frequency:  Min 1 time per week >20 minutes        Team Interventions: Nursing Interventions Patient/Family Education, Bladder Management, Bowel Management, Pain Management, Medication Management, Skin Care/Wound Management  PT interventions Ambulation/gait training, Balance/vestibular training, Cognitive remediation/compensation, Discharge planning, Disease management/prevention, DME/adaptive equipment instruction, Functional mobility training, Neuromuscular re-education, Pain management, Patient/family education, Psychosocial support, Stair training, Therapeutic Activities, Therapeutic Exercise, UE/LE Strength taining/ROM, UE/LE Coordination activities, Wheelchair propulsion/positioning  OT Interventions Training and development officer, Cognitive remediation/compensation, DME/adaptive equipment instruction, Community reintegration, Discharge planning, Functional mobility training, Psychosocial support, Therapeutic Activities, Visual/perceptual remediation/compensation, Patient/family education, Functional electrical stimulation, Splinting/orthotics, UE/LE Coordination activities, UE/LE Strength taining/ROM, Pain management, Neuromuscular re-education, Disease mangement/prevention, Self Care/advanced ADL retraining, Therapeutic Exercise, Wheelchair propulsion/positioning  SLP Interventions Cognitive remediation/compensation, Cueing hierarchy, Dysphagia/aspiration precaution training, Environmental controls, Functional tasks, Internal/external aids, Patient/family education, Oral motor exercises, Speech/Language facilitation, Therapeutic Activities  TR Interventions Recreation/leisure participation, Balance/Vestibular training, functional mobility, therapeutic activities, UE/LE strength/coordination, cognitive retraining/compensation, w/c mobility, community reintegration, pt/family education, adaptive equipment instruction/use, discharge planning, psychosocial support  SW/CM  Interventions Discharge Planning, Barrister's clerk, Patient/Family Education    Team Discharge Planning: Destination: PT-Skilled Nursing Facility (SNF) ,[place] (SNF) , [place] (SNF) Projected Follow-up: PT-Skilled nursing facility, 24 hour supervision/assistance, OT-  Skilled nursing facility, SLP-24 hour supervision/assistance, Skilled Nursing facility Projected Equipment Needs: PT-To be determined, OT- To be determined, SLP-To be determined Equipment Details: PT-Based on discharge destination, OT-  Patient/family involved in discharge planning: PT- Patient, Family Midwife,  OT-Patient, Family member/caregiver, SLP-Patient,  Family member/caregiver  MD ELOS: 22-64d Medical Rehab Prognosis:  Good Assessment: 63 y.o. male with history of CAD, HTN, OSA, esophagitis/duodenitis, who was admitted on 02/12/14 with left sided weakness with unsteady gait/fall, left facial droop and slurred speech. Patient has been off ASA/plavix due to recent bleeding ulcer. Family reports non-compliant with medications as well as CPAP use.  Patient with progressive weakness in ED. CT head negative.  MRI brain revealed acute infarct right MCA territory involving the right temporal lobe, insula, and basal ganglia and abrupt occlusion right M1 segment question due to embolus. MRA head/neck revealed bilateral carotid bifurcation patent, moderate stenosis precavernous  L-ICA and severe stenosis distal L-VA.  UDS negative.  2D echo with EF 70%, grade 2 diastolic dysfunction and hypokinesis mid-apicalanteroseptal myocardium. Carotid dopplers with 1-39% ICA stenosis.  BSS done and patient started on dysphagia 2  Now requiring 24/7 Rehab RN,MD, as well as CIR level PT, OT and SLP.  Treatment team will focus on ADLs and mobility with goals set at Max A x 1   See Team Conference Notes for weekly updates to the plan of care

## 2014-02-17 NOTE — Progress Notes (Addendum)
63 y.o. male with history of CAD, HTN, OSA, esophagitis/duodenitis, who was admitted on 02/12/14 with left sided weakness with unsteady gait/fall, left facial droop and slurred speech. Patient has been off ASA/plavix due to recent bleeding ulcer. Family reports non-compliant with medications as well as CPAP use.Patient with progressive weakness in ED. CT head negative. MRI brain revealed acute infarct right MCA territory involving the right temporal lobe, insula, and basal ganglia and abrupt occlusion right M1 segment question due to embolus. MRA head/neck revealed bilateral carotid bifurcation patent, moderate stenosis precavernous L-ICA and severe stenosis distal L-VA. UDS negative. 2D echo with EF 21%, grade 2 diastolic dysfunction and hypokinesis mid-apicalanteroseptal myocardium. Carotid dopplers with 1-39% ICA stenosis. BSS done and patient started on dysphagia 2, nectar liquids due to dysphagia. Neurology following for input and recommended resuming ASA and plavix for likely embolic stroke. Loop recorder placed today to rule out cryptogenic cause of stroke  Subjective/Complaints: Left ankle pain Hx of gout in past but no recent flare R ankle a little sore as well Review of Systems - Negative except above    Objective: Vital Signs: Blood pressure 119/60, pulse 72, temperature 98.5 F (36.9 C), temperature source Oral, resp. rate 18, weight 157.398 kg (347 lb), SpO2 98 %. Dg Ankle 2 Views Left  02/16/2014   CLINICAL DATA:  [place] 4 days ago.  Pain and swelling.  EXAM: LEFT ANKLE - 2 VIEW  COMPARISON:  None.  FINDINGS: No acute fractures identified. Moderate degenerative changes noted at the ankle joint. No definite joint effusion. A large calcaneal spurs are noted.  IMPRESSION: Degenerative changes but no acute fracture.   Electronically Signed   By: Kalman Jewels M.D.   On: 02/16/2014 17:49   Results for orders placed or performed during the hospital encounter of 02/15/14 (from the past 72  hour(s))  CBC WITH DIFFERENTIAL     Status: Abnormal   Collection Time: 02/16/14  4:55 AM  Result Value Ref Range   WBC 9.8 4.0 - 10.5 K/uL   RBC 4.23 4.22 - 5.81 MIL/uL   Hemoglobin 11.6 (L) 13.0 - 17.0 g/dL   HCT 36.1 (L) 39.0 - 52.0 %   MCV 85.3 78.0 - 100.0 fL   MCH 27.4 26.0 - 34.0 pg   MCHC 32.1 30.0 - 36.0 g/dL   RDW 13.8 11.5 - 15.5 %   Platelets 192 150 - 400 K/uL   Neutrophils Relative % 70 43 - 77 %   Neutro Abs 6.9 1.7 - 7.7 K/uL   Lymphocytes Relative 19 12 - 46 %   Lymphs Abs 1.8 0.7 - 4.0 K/uL   Monocytes Relative 10 3 - 12 %   Monocytes Absolute 1.0 0.1 - 1.0 K/uL   Eosinophils Relative 1 0 - 5 %   Eosinophils Absolute 0.1 0.0 - 0.7 K/uL   Basophils Relative 0 0 - 1 %   Basophils Absolute 0.0 0.0 - 0.1 K/uL  Comprehensive metabolic panel     Status: Abnormal   Collection Time: 02/16/14  4:55 AM  Result Value Ref Range   Sodium 141 137 - 147 mEq/L   Potassium 3.9 3.7 - 5.3 mEq/L   Chloride 104 96 - 112 mEq/L   CO2 23 19 - 32 mEq/L   Glucose, Bld 111 (H) 70 - 99 mg/dL   BUN 12 6 - 23 mg/dL   Creatinine, Ser 1.04 0.50 - 1.35 mg/dL   Calcium 8.6 8.4 - 10.5 mg/dL   Total Protein 7.2 6.0 -  8.3 g/dL   Albumin 3.2 (L) 3.5 - 5.2 g/dL   AST 21 0 - 37 U/L   ALT 16 0 - 53 U/L   Alkaline Phosphatase 73 39 - 117 U/L   Total Bilirubin 0.7 0.3 - 1.2 mg/dL   GFR calc non Af Amer 74 (L) >90 mL/min   GFR calc Af Amer 86 (L) >90 mL/min    Comment: (NOTE) The eGFR has been calculated using the CKD EPI equation. This calculation has not been validated in all clinical situations. eGFR's persistently <90 mL/min signify possible Chronic Kidney Disease.    Anion gap 14 5 - 15  Uric acid     Status: Abnormal   Collection Time: 02/16/14  4:55 AM  Result Value Ref Range   Uric Acid, Serum 8.6 (H) 4.0 - 7.8 mg/dL  Sedimentation rate     Status: Abnormal   Collection Time: 02/16/14  4:55 AM  Result Value Ref Range   Sed Rate 50 (H) 0 - 16 mm/hr     HEENT: normal Cardio:  RRR and no murmur Resp: CTA B/L and unlabored GI: BS positive and NT, ND Extremity:  Pulses positive and No Edema Skin:   Intact Neuro: Alert/Oriented, Cranial Nerve Abnormalities Left central 7, Normal Sensory, Abnormal Motor 0/5 LUE and LLE, Dysarthric and Inattention Musc/Skel:  Other no pain with Left shoulder ROM Gen NAD   Assessment/Plan: 1. 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 Physiatrist is providing close team supervision and 24 hour management of active medical problems listed below. Physiatrist and rehab team continue to assess barriers to discharge/monitor patient progress toward functional and medical goals.  FIM: FIM - Bathing Bathing Steps Patient Completed: Chest, Abdomen, Right upper leg, Left upper leg Bathing: 1: Two helpers  FIM - Upper Body Dressing/Undressing Upper body dressing/undressing steps patient completed: Thread/unthread right sleeve of pullover shirt/dresss, Put head through opening of pull over shirt/dress Upper body dressing/undressing: 3: Mod-Patient completed 50-74% of tasks FIM - Lower Body Dressing/Undressing Lower body dressing/undressing: 1: Two helpers  FIM - Toileting Toileting: 0: Activity did not occur  FIM - Air cabin crew Transfers: 1-Mechanical lift  FIM - Control and instrumentation engineer Devices: Sliding board, Arm rests Bed/Chair Transfer: 1: Two helpers  FIM - Locomotion: Wheelchair Distance: 150 Locomotion: Wheelchair: 2: Travels 150 ft or more: maneuvers on rugs and over door sills with maximal assistance (Pt: 25 - 49%) FIM - Locomotion: Ambulation Locomotion: Ambulation: 0: Activity did not occur (pt unable to tolerate WB LLE)  Comprehension Comprehension Mode: Auditory Comprehension: 3-Understands basic 50 - 74% of  the time/requires cueing 25 - 50%  of the time  Expression Expression Mode: Verbal Expression: 3-Expresses basic 50 - 74% of the time/requires cueing 25 - 50% of the time. Needs to repeat parts of sentences.  Social Interaction Social Interaction: 2-Interacts appropriately 25 - 49% of time - Needs frequent redirection.  Problem Solving Problem Solving: 2-Solves basic 25 - 49% of the time - needs direction more than half the time to initiate, plan or complete simple activities  Memory Memory: 3-Recognizes or recalls 50 - 74% of the time/requires cueing 25 - 49% of the time  Medical Problem List and Plan: 1. Functional deficits secondary to Right MCA infarct with left hemiparesis, dysarthria, dysphagia, and visual spatial deficits 2. DVT Prophylaxis/Anticoagulation: Pharmaceutical: Lovenox 3. New onset Headaches/Pain  Management: Continue tylenol prn for now. Left ankle pain is gout  , start steroid burst and taper. Mood: Poor frustration tolerance noted. LCSW to follow for evaluation and support. Monitor CBG on prednisone 5. Neuropsych: This patient is not capable of making decisions on his own behalf. 6. Skin/Wound Care: Routine pressure relief measures.  7. Fluids/Electrolytes/Nutrition: Monitor I/O. Check follow up labs in am. Offer supplements as indicated if intake poor.  8. HTN: Will monitor every 8 hours. Currently controlled of medications (and to avoid hypotension). will resume medications as indicated. Continue  9. H/o DM type 2?: Hgb A1c- 5.6 . Patient and wife deny any history of diabetes 10 OSA: Continue CPAP (has home machine) whenever asleep.  11. Recent esophagitis/duodenitis due to gastric ulcer: Protonix bid to help promote healing 12. CAD: Has chest wall pain due to poorly Healed sternum. Continue ASA, plavix, lipitor--off imdur and coreg  13. ABLA: Check H/H in am.   LOS (Days) 2 A FACE TO FACE EVALUATION WAS PERFORMED  Voncille Simm E 02/17/2014, 7:53  AM

## 2014-02-17 NOTE — Progress Notes (Signed)
Physical Therapy Session Note  Patient Details  Name: Gregory Wiley MRN: [id number] Date of Birth: 1950/11/11  Today's Date: 02/17/2014 PT Individual Time: 1400-1500 PT Individual Time Calculation (min): 60 min   Short Term Goals: Week 1:  PT Short Term Goal 1 (Week 1): Pt will perform rolling to L with min A and to R with total A x 1.  PT Short Term Goal 2 (Week 1): Pt will perform sit <> supine with total A x 1.  PT Short Term Goal 3 (Week 1): Pt will perform bed <> w/c transfer with +2 assist.  PT Short Term Goal 4 (Week 1): Pt will maintain dynamic sitting balance x 3 min with supervision.  PT Short Term Goal 5 (Week 1): Pt will propel w/c x 50 ft with min A.  Skilled Therapeutic Interventions/Progress Updates:   Pt received semi reclined in bed, wife present for session. Pt transferred supine > sit with HOB elevated and use of bed rail with total A x 1 and total cues for sequencing and attending to L. Pt performed slide board transfer to R bed > w/c with total A x 2. Pt propelled w/c using R hemi technique x 50 ft with min A and total verbal/demonstrational cues for technique and attending to L to avoid obstacles. Pt performed slide board transfer to R on incline w/c > mat table and to L on decline mat table > w/c with total A x 2. Pt requires total cues for anterior weight shift, WB through BLE, head hips relationship, and hand placement. From mat table, patient performed sit <> stand x 2 with parallel bars in front of patient to pull up on with RUE and max A x 2. Patient able to remain standing for 15-20 sec each trial. Pt reports 10/10 L ankle pain with WB but able to tolerate better this session compared to yesterday. Patient with static/dynamic sitting balance at supervision-min A level on mat and bed surfaces. Pt returned to w/c and left sitting in w/c with quick release belt on and 1/2 lap tray for LUE with wife present in room.   Therapy Documentation Precautions:   Precautions Precautions: Fall Precaution Comments: L neglect, L hemiplegia, morbid obesity Restrictions Weight Bearing Restrictions: No Pain: Pain Assessment Pain Assessment: 0-10 Pain Score: 10-Worst pain ever Pain Type: Acute pain Pain Location: Ankle Pain Orientation: Left Pain Descriptors / Indicators: Throbbing Pain Onset: With Activity Patients Stated Pain Goal: 1 Pain Intervention(s): Repositioned;Rest Multiple Pain Sites: No  See FIM for current functional status  Therapy/Group: Individual Therapy  Laretta Alstrom 02/17/2014, 4:28 PM

## 2014-02-17 NOTE — Progress Notes (Signed)
Speech Language Pathology Daily Session Note  Patient Details  Name: Gregory Wiley MRN: [id number] Date of Birth: [birth year]  Today's Date: 02/17/2014 SLP Individual Time: 7121-9758 SLP Individual Time Calculation (min): 60 min  Short Term Goals: Week 1: SLP Short Term Goal 1 (Week 1): Patien will consume Dys. 2 textures and nectar-thick liquids via straw with Mod cues for recall and use of safe swallow strategies to miimize overt s/s of aspiration SLP Short Term Goal 2 (Week 1): Patient will recall and scan to the left of midline to locate items as needed, during basic functional tasks with Mod mulitmodal cues  SLP Short Term Goal 3 (Week 1): Patient will produce intelligible speech at the phrase-sentence level of vebral expression with Min vebral cues to utilize speech intelligibilty strategies SLP Short Term Goal 4 (Week 1): Patient will label 2 physical and 2 cognitive deficits with Mod question cues SLP Short Term Goal 5 (Week 1): Patient will sustain attention to basic tasks for 1 minute with Mod verbal cues for redirections  Skilled Therapeutic Interventions: Skilled treatment session focused on addressing dysphagia and cognition goals.  SLP facilitated session with set-up of oral care via suctioning and provided Supervision level verbal cues to complete.  SLP also facilitated session with trials of water via cup and straw which resulted in throat clear x1; given overt s/s with thin trials on previous bedside and current fatigue SLP recommends initiation of Free Water Protocol to work towards upgrade.  Patient completed basic money sorting and counting with Mod multimodal clinician cues for scanning left of midline and working memory and Max cues to sustain attention to task for 1 minute increments.    FIM:  Comprehension Comprehension Mode: Auditory Comprehension: 3-Understands basic 50 - 74% of the time/requires cueing 25 - 50%  of the time Expression Expression Mode:  Verbal Expression: 3-Expresses basic 50 - 74% of the time/requires cueing 25 - 50% of the time. Needs to repeat parts of sentences. Social Interaction Social Interaction: 2-Interacts appropriately 25 - 49% of time - Needs frequent redirection. Problem Solving Problem Solving: 2-Solves basic 25 - 49% of the time - needs direction more than half the time to initiate, plan or complete simple activities Memory Memory: 3-Recognizes or recalls 50 - 74% of the time/requires cueing 25 - 49% of the time  Pain Pain Assessment Pain Assessment: 0-10 Pain Score: 5  Faces Pain Scale: No hurt Pain Type: Acute pain Pain Location: Neck Pain Orientation: Posterior Pain Descriptors / Indicators: Aching Pain Onset: On-going Patients Stated Pain Goal: 1 Pain Intervention(s): RN made aware;Repositioned;Rest Multiple Pain Sites: No  Therapy/Group: Individual Therapy  Carmelia Roller., CCC-SLP [phone]  [place] 02/17/2014, 1:30 PM

## 2014-02-17 NOTE — Progress Notes (Signed)
Occupational Therapy Session Note  Patient Details  Name: Gregory Wiley MRN: [id number] Date of Birth: 25-Jan-1951  Today's Date: 02/17/2014 OT Individual Time: 0900-1000 OT Individual Time Calculation (min): 60 min    Short Term Goals: Week 1:  OT Short Term Goal 1 (Week 1): Pt will roll to the right side in bed during LB selfcare with max assist and mod instructional cueing for sequencing. OT Short Term Goal 2 (Week 1): Pt's wife will return demonstrate safe assist with LUE PROM exercises. OT Short Term Goal 3 (Week 1): Pt will complete UB bathing sitting EOB unsupported and min assist.  OT Short Term Goal 4 (Week 1): Pt will perform UB dressing with min assist and mod instructional cueing.   OT Short Term Goal 5 (Week 1): Pt will perform toilet transfer sliding board or squat pivot with total assist +2 (pt 25%) to bariatric drop arm commode.   Skilled Therapeutic Interventions/Progress Updates:    Pt performed bathing and dressing during session.  Began in supine with rolling to perform peri washing and donn new brief and shorts.  He was able to roll to the left side with mod assist but continued to need total assist +2 (pt 10%) for rolling to the right side.  Max demonstrational cueing for sequencing bringing his LUE across using the RUE and having assistance with bending his knee to begin rolling.  He was able to recall steps with min questioning cues for subsequent rolls to the left.  Transitioned to sitting for UB bathing and dressing as well as for washing his LEs.  He was able to perform UB bathing with mod assist but could maintain static/dynamic sitting balance with min guard assist.  Therapist placed items left of midline so he would scan to locate washcloth and soap.  Mod questioning cues needed to locate them.  Pt with decreased awareness when squeezing out washcloth and on multiple occasions squeezed it out onto the floor and edge of the table.  Did not self-correct this even with  cueing.  Therapist assisted with washing bilateral feet and donning gripper socks as pt could not reach them.  Therapist also assisted with threading the left arm through the shirt and pull it over his trunk after he donned it over the RUE and his trunk.  Pt performed sliding board transfer from be to wheelchair with total assist +2 (pt 15%).  Applied waist belt and 1/2 lap tray for support of the LUE.   Therapy Documentation Precautions:  Precautions Precautions: Fall Precaution Comments: L neglect, L hemiplegia, morbid obesity Restrictions Weight Bearing Restrictions: No  Pain: Pain Assessment Pain Assessment: No/denies pain Pain Score: 0-No pain Faces Pain Scale: No hurt ADL: See FIM for current functional status  Therapy/Group: Individual Therapy  Zaineb Nowaczyk OTR/L 02/17/2014, 12:47 PM

## 2014-02-17 NOTE — Plan of Care (Signed)
Problem: RH BOWEL ELIMINATION Goal: RH STG MANAGE BOWEL WITH ASSISTANCE STG Manage Bowel with Mod Assistance.  Outcome: Not Progressing LBM 02-12-14 Goal: RH STG MANAGE BOWEL W/MEDICATION W/ASSISTANCE STG Manage Bowel with Medication with Mod Assistance.  Outcome: Not Progressing LBM 02-12-14 Goal: RH STG MANAGE BOWEL W/EQUIPMENT W/ASSISTANCE STG Manage Bowel With Equipment With Mod Assistance  Outcome: Not Progressing LBM 02-12-14  Problem: RH SKIN INTEGRITY Goal: RH STG SKIN FREE OF INFECTION/BREAKDOWN Min Assist  Outcome: Progressing Goal: RH STG MAINTAIN SKIN INTEGRITY WITH ASSISTANCE STG Maintain Skin Integrity With [place].  Outcome: Progressing  Problem: RH SAFETY Goal: RH STG ADHERE TO SAFETY PRECAUTIONS W/ASSISTANCE/DEVICE STG Adhere to Safety Precautions With Min Assistance/Device.  Outcome: Progressing Goal: RH STG DECREASED RISK OF FALL WITH ASSISTANCE STG Decreased Risk of Fall With [employer].  Outcome: Progressing  Problem: RH PAIN MANAGEMENT Goal: RH STG PAIN MANAGED AT OR BELOW PT'S PAIN GOAL < 4  Outcome: Progressing

## 2014-02-17 NOTE — Progress Notes (Signed)
Pt. Has already placed himself on his home CPAP via nasal prongs. CPAP is set on auto titrate (min: 4) Pt. Is tolerating CPAP well at this time without any complications.

## 2014-02-18 ENCOUNTER — Inpatient Hospital Stay (HOSPITAL_COMMUNITY): Payer: 59 | Admitting: Physical Therapy

## 2014-02-18 ENCOUNTER — Inpatient Hospital Stay (HOSPITAL_COMMUNITY): Payer: 59

## 2014-02-18 ENCOUNTER — Inpatient Hospital Stay (HOSPITAL_COMMUNITY): Payer: 59 | Admitting: Speech Pathology

## 2014-02-18 LAB — URINE MICROSCOPIC-ADD ON

## 2014-02-18 LAB — GLUCOSE, CAPILLARY
Glucose-Capillary: 113 mg/dL — ABNORMAL HIGH (ref 70–99)
Glucose-Capillary: 124 mg/dL — ABNORMAL HIGH (ref 70–99)
Glucose-Capillary: 142 mg/dL — ABNORMAL HIGH (ref 70–99)
Glucose-Capillary: 118 mg/dL — ABNORMAL HIGH (ref 70–99)

## 2014-02-18 LAB — URINALYSIS, ROUTINE W REFLEX MICROSCOPIC
Bilirubin Urine: NEGATIVE
Glucose, UA: NEGATIVE mg/dL
Hgb urine dipstick: NEGATIVE
Ketones, ur: NEGATIVE mg/dL
Nitrite: NEGATIVE
Protein, ur: NEGATIVE mg/dL
Specific Gravity, Urine: 1.026 (ref 1.005–1.030)
Urobilinogen, UA: 0.2 mg/dL (ref 0.0–1.0)
pH: 6 (ref 5.0–8.0)

## 2014-02-18 MED ORDER — SODIUM CHLORIDE 0.9 % IV SOLN
INTRAVENOUS | Status: DC
  Administered 2014-02-18 – 2014-02-24 (×5): via INTRAVENOUS
  Filled 2014-02-18: qty 1000

## 2014-02-18 MED ORDER — PANTOPRAZOLE SODIUM 40 MG PO TBEC: 40.0000 mg | DELAYED_RELEASE_TABLET | Freq: Two times a day (BID) | ORAL | Status: DC

## 2014-02-18 MED ORDER — PANTOPRAZOLE SODIUM 40 MG PO PACK
40.0000 mg | PACK | Freq: Two times a day (BID) | ORAL | Status: DC
  Administered 2014-02-18 – 2014-03-03 (×25): 40 mg
  Filled 2014-02-18 (×36): qty 20

## 2014-02-18 NOTE — Progress Notes (Signed)
Speech Language Pathology Daily Session Note  Patient Details  Name: Gregory Wiley MRN: [id number] Date of Birth: May 13, 1950  Today's Date: 02/18/2014 SLP Individual Time: 1100-1200 SLP Individual Time Calculation (min): 60 min  Short Term Goals: Week 1: SLP Short Term Goal 1 (Week 1): Patien will consume Dys. 2 textures and nectar-thick liquids via straw with Mod cues for recall and use of safe swallow strategies to miimize overt s/s of aspiration SLP Short Term Goal 2 (Week 1): Patient will recall and scan to the left of midline to locate items as needed, during basic functional tasks with Mod mulitmodal cues  SLP Short Term Goal 3 (Week 1): Patient will produce intelligible speech at the phrase-sentence level of vebral expression with Min vebral cues to utilize speech intelligibilty strategies SLP Short Term Goal 4 (Week 1): Patient will label 2 physical and 2 cognitive deficits with Mod question cues SLP Short Term Goal 5 (Week 1): Patient will sustain attention to basic tasks for 1 minute with Mod verbal cues for redirections  Skilled Therapeutic Interventions: Pt was seen for skilled ST targeting goals for dysphagia and cognition.  Upon arrival, pt was seated upright in wheelchair, awake, alert, and agreeable to participate in ST.  SLP provided min cues for sequencing, initiation, and sustained attention to complete thorough oral care prior to PO trials of regular water per the water protocol.  SLP also completed skilled observations during the abovementioned trials with pt exhibiting no overt s/s of aspiration noted at bedside.  Upon completion of trials, SLP facilitated the session with a basic visual scanning task in a moderately distracting environment targeting sustained attention, left attention, basic functional problem solving, and working memory.  Pt required overall mod assist multimodal cues to complete the task and was able to sustain his attention for ~2-3 minutes before requiring  redirection.  SLP also facilitated the session with a basic sorting activity targeting organization and sustained attention, during which pt required initial min assist instructional cues, faded to supervision cues for error awareness.  Pt is making progress towards meeting goals.  Continue per current plan of care.   FIM:  Comprehension Comprehension Mode: Auditory Comprehension: 4-Understands basic 75 - 89% of the time/requires cueing 10 - 24% of the time Expression Expression Mode: Verbal Expression: 4-Expresses basic 75 - 89% of the time/requires cueing 10 - 24% of the time. Needs helper to occlude trach/needs to repeat words. Social Interaction Social Interaction: 3-Interacts appropriately 50 - 74% of the time - May be physically or verbally inappropriate. Problem Solving Problem Solving: 2-Solves basic 25 - 49% of the time - needs direction more than half the time to initiate, plan or complete simple activities Memory Memory: 4-Recognizes or recalls 75 - 89% of the time/requires cueing 10 - 24% of the time FIM - Eating Eating Activity: 4: Help with managing cup/glass;5: Needs verbal cues/supervision  Pain Pain Assessment Pain Assessment: No/denies pain  Therapy/Group: Individual Therapy   Windell Moulding, M.A. CCC-SLP  Betina Puckett, Selinda Orion 02/18/2014, 3:26 PM

## 2014-02-18 NOTE — Progress Notes (Signed)
Physical Therapy Session Note  Patient Details  Name: Gregory Wiley MRN: [id number] Date of Birth: [birth year]  Today's Date: 02/18/2014 PT Co-Treatment Time: 1430-1510 PT Co-Treatment Time Calculation (min): 40 min (Full co-treat time 73 min)  Short Term Goals: Week 1:  PT Short Term Goal 1 (Week 1): Pt will perform rolling to L with min A and to R with total A x 1.  PT Short Term Goal 2 (Week 1): Pt will perform sit <> supine with total A x 1.  PT Short Term Goal 3 (Week 1): Pt will perform bed <> w/c transfer with +2 assist.  PT Short Term Goal 4 (Week 1): Pt will maintain dynamic sitting balance x 3 min with supervision.  PT Short Term Goal 5 (Week 1): Pt will propel w/c x 50 ft with min A.  Skilled Therapeutic Interventions/Progress Updates:   Skilled co-treatment with OT to address bed mobility, transfers, w/c mobility, and standing. Defer to OT note for PROM of LUE. Pt performed rolling with HOB flat and use of rails with min A x 2 to L and max A x 2 to R. Pt transferred supine > sit with HOB flat and use of rail with +2 assist for trunk and LLE management. Performed slide board transfer to R with +2 assist from bed > w/c. Discussed safety in room and need for quick release belt as RN reports pt was able to release quick release belt and attempted to get in bed by himself, continue to reinforce safety awareness. Pt propelled w/c using R hemi technique (utilized R LE mostly) x 30 ft with supervision and increased time, max cues for sequencing and attending to L for obstacle negotiation. In therapy gym, pt donned bariatric sling for Maxi Sky to attempt standing/WB through LLE. Required increased time for fitting sling appropriately due to body habitus and L inattention and switched to wearing bariatric sling backwards to promote improved BLE positioning (also needs gait belt added to straps to fit appropriately around patient). Patient made multiple attempts at partial standing with parallel bars  positioned in front of patient to pull up on with RUE and therapist blocking L knee from buckling. Patient unable to achieve full standing compared to yesterday's session and increased c/o L ankle pain with WB and discomfort with sling. Pt requires total cues and manual placement of BLE as he tends to place R LE in front/to side in preparation for standing. Pt requires +3 assist to reposition in chair with facilitation of anterior weight shift and difficulty clearing hips in order to scoot back in chair. Upon returning to room, RN requesting pt return to bed. Therefore used MaxiSky lift with +2 assist to return patient to supine. Pt left in bed with RN present.   Therapy Documentation Precautions:  Precautions Precautions: Fall Precaution Comments: L neglect, L hemiplegia, morbid obesity Restrictions Weight Bearing Restrictions: No Pain: Pain Assessment Pain Assessment: Faces Faces Pain Scale: Hurts whole lot Pain Type: Acute pain Pain Location: Ankle Pain Orientation: Left Pain Descriptors / Indicators: Grimacing;Discomfort Pain Onset: With Activity Pain Intervention(s): Repositioned;Emotional support   See FIM for current functional status  Therapy/Group: Co-Treatment with OT  Carney Living A 02/18/2014, 4:35 PM

## 2014-02-18 NOTE — Progress Notes (Addendum)
Upon receiving assessment at 1500, RN assessed need to use the urinal or have BM. Pt attempted urinal with no result.  Pt had urge to void. Pt stated he has not voided all day.  I&O cath per order with result of 700cc amber, malodorous smell present.  Notified Algis Liming, PA with results.  New orders per Algis Liming, PA made. Will cont. To monitor pt.

## 2014-02-18 NOTE — Progress Notes (Signed)
Pt has home CPAP setup at bedside. He puts it on when he is ready. RT will continue to monitor.

## 2014-02-18 NOTE — Progress Notes (Signed)
Occupational Therapy Session Note  Patient Details  Name: Gregory Wiley MRN: [id number] Date of Birth: [birth year]  Today's Date: 02/18/2014 OT Individual Time: 0830-0930 OT Individual Time Calculation (min): 60 min    Short Term Goals: Week 1:  OT Short Term Goal 1 (Week 1): Pt will roll to the right side in bed during LB selfcare with max assist and mod instructional cueing for sequencing. OT Short Term Goal 2 (Week 1): Pt's wife will return demonstrate safe assist with LUE PROM exercises. OT Short Term Goal 3 (Week 1): Pt will complete UB bathing sitting EOB unsupported and min assist.  OT Short Term Goal 4 (Week 1): Pt will perform UB dressing with min assist and mod instructional cueing.   OT Short Term Goal 5 (Week 1): Pt will perform toilet transfer sliding board or squat pivot with total assist +2 (pt 25%) to bariatric drop arm commode.   Skilled Therapeutic Interventions/Progress Updates: ADL-retraining with emphasis on bed mobility, left attention, UB dressing, and dynamic sitting balance.   Pt received supine in bed, finishing his breakfast with RN tech assisting.   After setup assist to prepare pan bath, pt requested sponge/brush for oral care and completed cleaning his mouth thoroughly unassisted.   Pt performed bed mobility to reposition to head of bed using trendelenburg position with mod- max assist X 1, and verbal cue to push through right LE.   Pt completed bed rolls with +2 assist although lifting his hip while right side lying.    Pt completed cleaning peri-area with setup assist this session with good thoroughness but requires assist to wash under abdominal skin fold.    Pt required only mod A x1 to rise from side lying to sitting at edge of bed and donned his shirt with min instructional cues on lacing his left arm through arm hole and min steadying assist.   Pt was unable to sustain balance at edge of bed needed for slide board transfer this session d/t aggravation of left heel  pain during weight-bearing.    Pt tolerated Maxi-sky lift move from supine to w/c and was left in w/c with safety belt affixed and call light on his over bed table.    Therapy Documentation Precautions:  Precautions Precautions: Fall Precaution Comments: L neglect, L hemiplegia, morbid obesity Restrictions Weight Bearing Restrictions: No  Pain: No/denies pain   See FIM for current functional status  Therapy/Group: Individual Therapy  Kaylanie Capili 02/18/2014, 12:09 PM

## 2014-02-18 NOTE — Progress Notes (Signed)
63 y.o. male with history of CAD, HTN, OSA, esophagitis/duodenitis, who was admitted on 02/12/14 with left sided weakness with unsteady gait/fall, left facial droop and slurred speech. Patient has been off ASA/plavix due to recent bleeding ulcer. Family reports non-compliant with medications as well as CPAP use.Patient with progressive weakness in ED. CT head negative. MRI brain revealed acute infarct right MCA territory involving the right temporal lobe, insula, and basal ganglia and abrupt occlusion right M1 segment question due to embolus. MRA head/neck revealed bilateral carotid bifurcation patent, moderate stenosis precavernous L-ICA and severe stenosis distal L-VA. UDS negative. 2D echo with EF 75%, grade 2 diastolic dysfunction and hypokinesis mid-apicalanteroseptal myocardium. Carotid dopplers with 1-39% ICA stenosis. BSS done and patient started on dysphagia 2, nectar liquids due to dysphagia. Neurology following for input and recommended resuming ASA and plavix for likely embolic stroke. Loop recorder placed today to rule out cryptogenic cause of stroke  Subjective/Complaints: No new complaints other than a little joint soreness. Slept well.  Review of Systems - Negative except above      Objective: Vital Signs: Blood pressure 143/63, pulse 51, temperature 98.3 F (36.8 C), temperature source Oral, resp. rate 18, weight 157.398 kg (347 lb), SpO2 95 %. Dg Ankle 2 Views Left  02/16/2014   CLINICAL DATA:  [place] 4 days ago.  Pain and swelling.  EXAM: LEFT ANKLE - 2 VIEW  COMPARISON:  None.  FINDINGS: No acute fractures identified. Moderate degenerative changes noted at the ankle joint. No definite joint effusion. A large calcaneal spurs are noted.  IMPRESSION: Degenerative changes but no acute fracture.   Electronically Signed   By: Kalman Jewels M.D.   On: 02/16/2014 17:49   Results for orders placed or performed during the hospital encounter of 02/15/14 (from the past 72 hour(s))  CBC  WITH DIFFERENTIAL     Status: Abnormal   Collection Time: 02/16/14  4:55 AM  Result Value Ref Range   WBC 9.8 4.0 - 10.5 K/uL   RBC 4.23 4.22 - 5.81 MIL/uL   Hemoglobin 11.6 (L) 13.0 - 17.0 g/dL   HCT 36.1 (L) 39.0 - 52.0 %   MCV 85.3 78.0 - 100.0 fL   MCH 27.4 26.0 - 34.0 pg   MCHC 32.1 30.0 - 36.0 g/dL   RDW 13.8 11.5 - 15.5 %   Platelets 192 150 - 400 K/uL   Neutrophils Relative % 70 43 - 77 %   Neutro Abs 6.9 1.7 - 7.7 K/uL   Lymphocytes Relative 19 12 - 46 %   Lymphs Abs 1.8 0.7 - 4.0 K/uL   Monocytes Relative 10 3 - 12 %   Monocytes Absolute 1.0 0.1 - 1.0 K/uL   Eosinophils Relative 1 0 - 5 %   Eosinophils Absolute 0.1 0.0 - 0.7 K/uL   Basophils Relative 0 0 - 1 %   Basophils Absolute 0.0 0.0 - 0.1 K/uL  Comprehensive metabolic panel     Status: Abnormal   Collection Time: 02/16/14  4:55 AM  Result Value Ref Range   Sodium 141 137 - 147 mEq/L   Potassium 3.9 3.7 - 5.3 mEq/L   Chloride 104 96 - 112 mEq/L   CO2 23 19 - 32 mEq/L   Glucose, Bld 111 (H) 70 - 99 mg/dL   BUN 12 6 - 23 mg/dL   Creatinine, Ser 1.04 0.50 - 1.35 mg/dL   Calcium 8.6 8.4 - 10.5 mg/dL   Total Protein 7.2 6.0 - 8.3 g/dL   Albumin  3.2 (L) 3.5 - 5.2 g/dL   AST 21 0 - 37 U/L   ALT 16 0 - 53 U/L   Alkaline Phosphatase 73 39 - 117 U/L   Total Bilirubin 0.7 0.3 - 1.2 mg/dL   GFR calc non Af Amer 74 (L) >90 mL/min   GFR calc Af Amer 86 (L) >90 mL/min    Comment: (NOTE) The eGFR has been calculated using the CKD EPI equation. This calculation has not been validated in all clinical situations. eGFR's persistently <90 mL/min signify possible Chronic Kidney Disease.    Anion gap 14 5 - 15  Uric acid     Status: Abnormal   Collection Time: 02/16/14  4:55 AM  Result Value Ref Range   Uric Acid, Serum 8.6 (H) 4.0 - 7.8 mg/dL  Sedimentation rate     Status: Abnormal   Collection Time: 02/16/14  4:55 AM  Result Value Ref Range   Sed Rate 50 (H) 0 - 16 mm/hr  Glucose, capillary     Status: Abnormal    Collection Time: 02/17/14 11:23 AM  Result Value Ref Range   Glucose-Capillary 138 (H) 70 - 99 mg/dL  Glucose, capillary     Status: Abnormal   Collection Time: 02/17/14  4:26 PM  Result Value Ref Range   Glucose-Capillary 126 (H) 70 - 99 mg/dL  Glucose, capillary     Status: Abnormal   Collection Time: 02/17/14  8:35 PM  Result Value Ref Range   Glucose-Capillary 137 (H) 70 - 99 mg/dL  Glucose, capillary     Status: Abnormal   Collection Time: 02/18/14  6:47 AM  Result Value Ref Range   Glucose-Capillary 113 (H) 70 - 99 mg/dL     HEENT: normal Cardio: RRR and no murmur Resp: CTA B/L and unlabored GI: BS positive and NT, ND Extremity:  Pulses positive and No Edema Skin:   Intact Neuro: Alert/Oriented, Cranial Nerve Abnormalities Left central 7, Normal Sensory, Abnormal Motor 0/5 LUE and LLE persistent. Does sense pain. , Dysarthric and left Inattention Musc/Skel:  Other no pain with Left shoulder ROM Gen NAD   Assessment/Plan: 1. 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 Physiatrist is providing close team supervision and 24 hour management of active medical problems listed below. Physiatrist and rehab team continue to assess barriers to discharge/monitor patient progress toward functional and medical goals.  FIM: FIM - Bathing Bathing Steps Patient Completed: Chest, Abdomen, Right upper leg, Left upper leg, Left Arm Bathing: 1: Two helpers  FIM - Upper Body Dressing/Undressing Upper body dressing/undressing steps patient completed: Thread/unthread right sleeve of pullover shirt/dresss, Put head through opening of pull over shirt/dress Upper body dressing/undressing: 3: Mod-Patient completed 50-74% of tasks FIM - Lower Body Dressing/Undressing Lower body dressing/undressing: 1: Two helpers  FIM -  Toileting Toileting: 0: Activity did not occur  FIM - Air cabin crew Transfers: 0-Activity did not occur  FIM - Control and instrumentation engineer Devices: Sliding board, HOB elevated, Bed rails Bed/Chair Transfer: 1: Supine > Sit: Total A (helper does all/Pt. < 25%), 1: Two helpers  FIM - Locomotion: Wheelchair Distance: 50 Locomotion: Wheelchair: 2: Travels 78 - 149 ft with minimal assistance (Pt.>75%) FIM - Locomotion: Ambulation Locomotion: Ambulation: 0: Activity did not occur  Comprehension Comprehension Mode: Auditory Comprehension: 3-Understands basic 50 - 74% of the time/requires cueing 25 - 50%  of the time  Expression Expression Mode: Verbal Expression: 3-Expresses basic 50 - 74% of the time/requires cueing 25 - 50% of the time. Needs to repeat parts of sentences.  Social Interaction Social Interaction: 2-Interacts appropriately 25 - 49% of time - Needs frequent redirection.  Problem Solving Problem Solving: 2-Solves basic 25 - 49% of the time - needs direction more than half the time to initiate, plan or complete simple activities  Memory Memory: 3-Recognizes or recalls 50 - 74% of the time/requires cueing 25 - 49% of the time  Medical Problem List and Plan: 1. Functional deficits secondary to Right MCA infarct with left hemiparesis, dysarthria, dysphagia, and visual spatial deficits 2. DVT Prophylaxis/Anticoagulation: Pharmaceutical: Lovenox 3. New onset Headaches/Pain Management: Continue tylenol prn for now. Left ankle pain is gout  , start steroid burst and taper. Mood: Poor frustration tolerance noted. LCSW to follow for evaluation and support. Monitor CBG on prednisone 5. Neuropsych: This patient is not capable of making decisions on his own behalf. 6. Skin/Wound Care: Routine pressure relief measures.  7. Fluids/Electrolytes/Nutrition: Monitor I/O. Check follow up labs in am. Offer supplements as indicated if intake poor.  8. HTN:  Will monitor every 8 hours. Currently controlled of medications (and to avoid hypotension). will resume medications as indicated. Continue  9. H/o DM type 2?: Hgb A1c- 5.6 . Patient and wife deny any history of diabetes  -dc  cbg checks 10 OSA: Continue CPAP (has home machine) whenever asleep.  11. Recent esophagitis/duodenitis due to gastric ulcer: Protonix bid to help promote healing 12. CAD: Has chest wall pain due to poorly Healed sternum. Continue ASA, plavix, lipitor--off imdur and coreg  13. ABLA: 11.6 hgb   LOS (Days) 3 A FACE TO FACE EVALUATION WAS PERFORMED  SWARTZ,ZACHARY T 02/18/2014, 8:46 AM

## 2014-02-18 NOTE — IPOC Note (Deleted)
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 Active Problems:   Morbid obesity   Hemiplegia affecting left dominant side     Functional Problem List: Nursing Behavior, Pain, Safety  PT Balance, Behavior, Edema, Endurance, Motor, Nutrition, Pain, Perception, Safety  OT Balance, Cognition, Edema, Endurance, Motor, Pain, Perception, Safety, Sensory, Vision  SLP Cognition, Nutrition, Linguistic  TR         Basic ADL's: OT Eating, Grooming, Bathing, Dressing, Toileting     Advanced  ADL's: OT       Transfers: PT Bed Mobility, Bed to Chair, Teacher, early years/pre, Tub/Shower     Locomotion: PT Ambulation, Emergency planning/management officer, Stairs     Additional Impairments: OT Fuctional Use of Upper Extremity  SLP Swallowing, Communication, Social Cognition expression Social Interaction, Problem Solving, Memory, Attention, Awareness  TR      Anticipated Outcomes Item Anticipated Outcome  Self Feeding supervision  Swallowing  Min assist    Basic self-care  max assist  Toileting  max assist   Bathroom Transfers max assist  Bowel/Bladder  Total Assist with bladder; Mod with bowel  Transfers  max A x1  Locomotion  max A x1  Communication  Min assist   Cognition  Min assist   Pain  <4  Safety/Judgment  Min Assist   Therapy Plan: PT Intensity: Minimum of 1-2 x/day ,45 to 90 minutes PT Frequency: 5 out of 7 days PT Duration Estimated Length of Stay: 26-28 days OT Intensity: Minimum of 1-2 x/day, 45 to 90 minutes OT Frequency: 5 out of 7 days OT Duration/Estimated Length of Stay: 26-28 days SLP Intensity: Minumum of 1-2 x/day, 30 to 90 minutes SLP Frequency: 5 out of 7 days SLP Duration/Estimated Length of Stay: 4 weeks       Team Interventions: Nursing Interventions Patient/Family Education, Bladder  Management, Bowel Management, Pain Management, Medication Management, Skin Care/Wound Management  PT interventions Ambulation/gait training, Balance/vestibular training, Cognitive remediation/compensation, Discharge planning, Disease management/prevention, DME/adaptive equipment instruction, Functional mobility training, Neuromuscular re-education, Pain management, Patient/family education, Psychosocial support, Stair training, Therapeutic Activities, Therapeutic Exercise, UE/LE Strength taining/ROM, UE/LE Coordination activities, Wheelchair propulsion/positioning  OT Interventions Training and development officer, Cognitive remediation/compensation, DME/adaptive equipment instruction, Community reintegration, Discharge planning, Functional mobility training, Psychosocial support, Therapeutic Activities, Visual/perceptual remediation/compensation, Patient/family education, Functional electrical stimulation, Splinting/orthotics, UE/LE Coordination activities, UE/LE Strength taining/ROM, Pain management, Neuromuscular re-education, Disease mangement/prevention, Self Care/advanced ADL retraining, Therapeutic Exercise, Wheelchair propulsion/positioning  SLP Interventions Cognitive remediation/compensation, Cueing hierarchy, Dysphagia/aspiration precaution training, Environmental controls, Functional tasks, Internal/external aids, Patient/family education, Oral motor exercises, Speech/Language facilitation, Therapeutic Activities  TR Interventions    SW/CM Interventions Discharge Planning, Psychosocial Support, Patient/Family Education    Team Discharge Planning: Destination: PT-Skilled [place] (SNF) ,[place] (SNF) , [place] (SNF) Projected Follow-up: PT-Skilled nursing facility, 24 hour supervision/assistance, OT-  Skilled nursing facility, SLP-24 hour supervision/assistance, Skilled Nursing facility Projected Equipment Needs: PT-To be determined, OT- To be  determined, SLP-To be determined Equipment Details: PT-Based on discharge destination, OT-  Patient/family involved in discharge planning: PT- Patient, Family Midwife,  OT-Patient, Family member/caregiver, SLP-Patient, Family member/caregiver  MD ELOS: 26-28 days Medical Rehab Prognosis:  Good Assessment: The patient has been admitted for CIR therapies with the diagnosis of right MCA infarct. The team will be addressing functional mobility, strength, stamina, balance, safety, adaptive techniques and equipment, self-care, bowel and  bladder mgt, patient and caregiver education, NMR, visual-spatial awareness, cognitive perceptual awareness, speech, swallowing. Goals have been set at max assist for ADL's and self-care, transfers and mobility, min assist for cognition and communication.    Meredith Staggers, MD, FAAPMR      See Team Conference Notes for weekly updates to the plan of care

## 2014-02-18 NOTE — Progress Notes (Signed)
Occupational Therapy Session Note  Patient Details  Name: Gregory Wiley MRN: [id number] Date of Birth: Jun 03, 1950  Today's Date: 02/18/2014 OT Co-Treatment Time: 1357-1430 OT Co-Treatment Time Calculation (min): 33 min  Co tx from [id number] with PT   Short Term Goals: Week 1:  OT Short Term Goal 1 (Week 1): Pt will roll to the right side in bed during LB selfcare with max assist and mod instructional cueing for sequencing. OT Short Term Goal 2 (Week 1): Pt's wife will return demonstrate safe assist with LUE PROM exercises. OT Short Term Goal 3 (Week 1): Pt will complete UB bathing sitting EOB unsupported and min assist.  OT Short Term Goal 4 (Week 1): Pt will perform UB dressing with min assist and mod instructional cueing.   OT Short Term Goal 5 (Week 1): Pt will perform toilet transfer sliding board or squat pivot with total assist +2 (pt 25%) to bariatric drop arm commode.   Skilled Therapeutic Interventions/Progress Updates:  Upon entering the room, pt supine in bed with no c/o pain. Skilled co treatment with PT this session as pt required +2 for safety. OT performed PROM to L UE in all planes x 10 reps of each. Pt wincing at ~120 degrees shoulder flexion and ~ 90 shoulder abduction but reporting pain quickly easing off as stretch continued. Pt performing Roll to R and L with +2 assist and min verbal cues for proper technique. Supine >sit with +2 for assistance with L LE and trunk as pt also utilized bed rails to push from. Transfer from bed > wheelchair with sliding board and +2 assist. Once seated in wheelchair pt propelled self ~ 30 feet with increased time, verbal cues to attend to L, and supervision. During session, bariatric sling for maxi sky donned with multiple attempts made to stand with parallel bar in order to promote weight bearing on L LE. Pt reporting increased pain in ankle each attempt OT assisted pt back to room via wheelchair and assisted RN staff in utilizing maxi sky to  transfer pt from wheelchair back to bed in order for in and out cath secondary to pt not voiding. RN present in room assisting pt as therapist exiting.   Therapy Documentation Precautions:  Precautions Precautions: Fall Precaution Comments: L neglect, L hemiplegia, morbid obesity Restrictions Weight Bearing Restrictions: No Pain: Pain Assessment Pain Assessment: Faces Faces Pain Scale: Hurts whole lot Pain Type: Acute pain Pain Location: Ankle Pain Orientation: Left Pain Descriptors / Indicators: Grimacing;Discomfort Pain Onset: With Activity Pain Intervention(s): Repositioned;Emotional support  See FIM for current functional status  Therapy/Group: Co-Treatment with PT  Phineas Semen 02/18/2014, 4:38 PM

## 2014-02-19 ENCOUNTER — Inpatient Hospital Stay (HOSPITAL_COMMUNITY): Payer: 59 | Admitting: Physical Therapy

## 2014-02-19 ENCOUNTER — Encounter (HOSPITAL_COMMUNITY): Payer: Self-pay | Admitting: Internal Medicine

## 2014-02-19 ENCOUNTER — Inpatient Hospital Stay (HOSPITAL_COMMUNITY): Payer: 59 | Admitting: Speech Pathology

## 2014-02-19 LAB — BASIC METABOLIC PANEL
Anion gap: 13 (ref 5–15)
BUN: 21 mg/dL (ref 6–23)
CO2: 24 mEq/L (ref 19–32)
Calcium: 9 mg/dL (ref 8.4–10.5)
Chloride: 101 mEq/L (ref 96–112)
Creatinine, Ser: 1.04 mg/dL (ref 0.50–1.35)
GFR calc Af Amer: 86 mL/min — ABNORMAL LOW (ref >90)
GFR calc non Af Amer: 74 mL/min — ABNORMAL LOW (ref >90)
Glucose, Bld: 107 mg/dL — ABNORMAL HIGH (ref 70–99)
Potassium: 4.3 mEq/L (ref 3.7–5.3)
Sodium: 138 mEq/L (ref 137–147)

## 2014-02-19 LAB — GLUCOSE, CAPILLARY
Glucose-Capillary: 110 mg/dL — ABNORMAL HIGH (ref 70–99)
Glucose-Capillary: 123 mg/dL — ABNORMAL HIGH (ref 70–99)
Glucose-Capillary: 127 mg/dL — ABNORMAL HIGH (ref 70–99)

## 2014-02-19 MED ORDER — FLUTICASONE PROPIONATE 50 MCG/ACT NA SUSP
2.0000 | Freq: Every day | NASAL | Status: DC
  Administered 2014-02-19 – 2014-03-15 (×25): 2 via NASAL
  Filled 2014-02-19: qty 16

## 2014-02-19 NOTE — Progress Notes (Signed)
Gregory Wiley is a 63 y.o. male Jul 23, 1950 [id number]  Subjective: complains of nasal congestion and PND, chronic symptoms and uses nasal spray at home for same - hard to wear CPAP because of same. No other new problems. Feeling OK.  Objective: Vital signs in last 24 hours: Temp:  [97.7 F (36.5 C)-97.8 F (36.6 C)] 97.8 F (36.6 C) (12/05 0438) Pulse Rate:  [57-61] 61 (12/05 0438) Resp:  [18-20] 18 (12/05 0438) BP: (121-129)/(61-67) 129/67 mmHg (12/05 0438) SpO2:  [94 %-100 %] 94 % (12/05 0438) Weight change:  Last BM Date: 02/17/14  Intake/Output from previous day: 12/04 0701 - 12/05 0700 In: 670 [P.O.:660; I.V.:10] Out: 1338 [Urine:1338]  Physical Exam General: No apparent distress   Obese, supine, intermittently falling asleep with apnea (not wearing CPAP) Lungs: Normal effort. Lungs clear to auscultation anterior, no crackles or wheezes. Cardiovascular: Regular rate and rhythm, no edema Wounds: N/A      Lab Results: BMET    Component Value Date/Time   NA 138 02/19/2014 0450   K 4.3 02/19/2014 0450   CL 101 02/19/2014 0450   CO2 24 02/19/2014 0450   GLUCOSE 107* 02/19/2014 0450   BUN 21 02/19/2014 0450   CREATININE 1.04 02/19/2014 0450   CREATININE 1.12 01/04/2013 1206   CALCIUM 9.0 02/19/2014 0450   GFRNONAA 74* 02/19/2014 0450   GFRAA 86* 02/19/2014 0450   CBC    Component Value Date/Time   WBC 9.8 02/16/2014 0455   RBC 4.23 02/16/2014 0455   HGB 11.6* 02/16/2014 0455   HCT 36.1* 02/16/2014 0455   PLT 192 02/16/2014 0455   MCV 85.3 02/16/2014 0455   MCH 27.4 02/16/2014 0455   MCHC 32.1 02/16/2014 0455   RDW 13.8 02/16/2014 0455   LYMPHSABS 1.8 02/16/2014 0455   MONOABS 1.0 02/16/2014 0455   EOSABS 0.1 02/16/2014 0455   BASOSABS 0.0 02/16/2014 0455   CBG's (last 3):   Recent Labs  02/18/14 1631 02/18/14 2123 02/19/14 0639  GLUCAP 142* 118* 110*   LFT's Lab Results  Component Value Date   ALT 16 02/16/2014   AST 21 02/16/2014   ALKPHOS  73 02/16/2014   BILITOT 0.7 02/16/2014    Studies/Results: No results found.  Medications:  I have reviewed the patient's current medications. Scheduled Medications: . aspirin EC  81 mg Oral Daily  . atorvastatin  80 mg Oral q1800  . clopidogrel  75 mg Oral Daily  . enoxaparin (LOVENOX) injection  80 mg Subcutaneous Q24H  . pantoprazole sodium  40 mg Per Tube BID  . predniSONE  5 mg Oral 4X daily taper  . sennosides  10 mL Oral BID  . 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 Functional deficits secondary to Right MCA infarct with left hemiparesis, dysarthria, dysphagia, and visual spatial deficits 2. DVT Prophylaxis/Anticoagulation: Pharmaceutical: Lovenox 3. New onset Headaches/Pain Management: Continue tylenol prn for now. Left ankle pain is gout -improved on steroid burst and taper. 4.  Mood: Poor frustration tolerance noted. LCSW to follow for evaluation and support.  5. Neuropsych: This patient is not capable of making decisions on his own behalf. 6. Skin/Wound Care: Routine pressure relief measures.  7. Fluids/Electrolytes/Nutrition: Monitor I/O.  Check follow up labs in am. Offer supplements as indicated if intake poor.  8. HTN: Will monitor every 8 hours. Currently controlled of medications (and to avoid hypotension). will resume medications as indicated. Continue  9. OSA: encouraged to continue CPAP (has home machine) whenever asleep. Add Flonase for allergic rhinitis symptoms  10. Recent esophagitis/duodenitis due to gastric ulcer: Protonix bid to help promote  healing 11. CAD: Has chest wall pain due to poorly Healed sternum. Continue ASA, plavix, lipitor--off imdur and coreg  12. ABLA due to GI loss: 11.6 hgb HD stable  Length of stay, days: 4   Gregory Shapley A. Asa Lente, MD 02/19/2014, 9:57 AM

## 2014-02-19 NOTE — Progress Notes (Signed)
Speech Language Pathology Daily Session Note  Patient Details  Name: Gregory Wiley MRN: [id number] Date of Birth: 1951-02-06  Today's Date: 02/19/2014 SLP Individual Time: 1120-1205 SLP Individual Time Calculation (min): 45 min  Short Term Goals: Week 1: SLP Short Term Goal 1 (Week 1): Patien will consume Dys. 2 textures and nectar-thick liquids via straw with Mod cues for recall and use of safe swallow strategies to miimize overt s/s of aspiration SLP Short Term Goal 2 (Week 1): Patient will recall and scan to the left of midline to locate items as needed, during basic functional tasks with Mod mulitmodal cues  SLP Short Term Goal 3 (Week 1): Patient will produce intelligible speech at the phrase-sentence level of vebral expression with Min vebral cues to utilize speech intelligibilty strategies SLP Short Term Goal 4 (Week 1): Patient will label 2 physical and 2 cognitive deficits with Mod question cues SLP Short Term Goal 5 (Week 1): Patient will sustain attention to basic tasks for 1 minute with Mod verbal cues for redirections  Skilled Therapeutic Interventions: Skilled treatment session focused on addressing dysphagia and cognition goals.  SLP completed trials of water via cup after and off form RN; patient demonstrated no overt s/s of aspiration.  SLP also facilitated session with Mod cues to scan to left and locate items on left for tray set up as well as Max verbal cues to recall and utilize safe swallow strategies while consuming Dys.2 textures and nectar-thick liquids via cup.  Recommend to continue with full supervision for cuing that focuses on management of portion size, pace and left pocketing.    FIM:  Comprehension Comprehension Mode: Auditory Comprehension: 2-Understands basic 25 - 49% of the time/requires cueing 51 - 75% of the time Expression Expression Mode: Verbal Expression: 3-Expresses basic 50 - 74% of the time/requires cueing 25 - 50% of the time. Needs to repeat  parts of sentences. Social Interaction Social Interaction: 2-Interacts appropriately 25 - 49% of time - Needs frequent redirection. Problem Solving Problem Solving: 2-Solves basic 25 - 49% of the time - needs direction more than half the time to initiate, plan or complete simple activities Memory Memory: 3-Recognizes or recalls 50 - 74% of the time/requires cueing 25 - 49% of the time FIM - Eating Eating Activity: 4: Help with managing cup/glass;5: Needs verbal cues/supervision;4: Helper checks for pocketed food;5: Set-up assist for cut food;5: Set-up assist for open containers  Pain Pain Assessment Pain Assessment: No/denies pain  Therapy/Group: Individual Therapy  Carmelia Roller., CCC-SLP [phone]  [place] 02/19/2014, 12:56 PM

## 2014-02-19 NOTE — Progress Notes (Signed)
RT Note: Pt omn home CPAP tolerating well RT will continue to monitor

## 2014-02-19 NOTE — Plan of Care (Signed)
Problem: RH SKIN INTEGRITY Goal: RH STG SKIN FREE OF INFECTION/BREAKDOWN Min Assist  Outcome: Progressing

## 2014-02-19 NOTE — Plan of Care (Signed)
Problem: RH PAIN MANAGEMENT Goal: RH STG PAIN MANAGED AT OR BELOW PT'S PAIN GOAL <4  Outcome: Progressing     

## 2014-02-19 NOTE — Plan of Care (Signed)
Problem: RH SAFETY Goal: RH STG ADHERE TO SAFETY PRECAUTIONS W/ASSISTANCE/DEVICE STG Adhere to Safety Precautions With Min Assistance/Device.  Outcome: Progressing     

## 2014-02-19 NOTE — Progress Notes (Signed)
Physical Therapy Session Note  Patient Details  Name: Gregory Wiley MRN: [id number] Date of Birth: 1951-03-13  Today's Date: 02/19/2014 PT Individual Time: 1000-1105 PT Individual Time Calculation (min): 65 min   Short Term Goals: Week 1:  PT Short Term Goal 1 (Week 1): Pt will perform rolling to L with min A and to R with total A x 1.  PT Short Term Goal 2 (Week 1): Pt will perform sit <> supine with total A x 1.  PT Short Term Goal 3 (Week 1): Pt will perform bed <> w/c transfer with +2 assist.  PT Short Term Goal 4 (Week 1): Pt will maintain dynamic sitting balance x 3 min with supervision.  PT Short Term Goal 5 (Week 1): Pt will propel w/c x 50 ft with min A.  Skilled Therapeutic Interventions/Progress Updates:    Therapeutic Activity: PT instructs pt in rolling R and L in bed to pull shorts over bottom after PT and Tech attach brief straps, with bedrails req +2 assist to roll R and min A to roll L.  PT instructs pt in supine to rolling L req min A with rail, L side lie to sit req +2 max A to obtain sitting position.  PT instructs pt in scoot transfer from bed to w/c with SB req +3 assist: min x 2 for assist in scooting to the R, but a 3rd assist to stabilize the w/c to prevent it from skidding.   W/C Management: PT instructs pt in w/c propulsion with R hemi technique req mod-max A 50' x 2 reps with seated rest break due to discomfort in pt's R ankle.  Pt demonstrates ability to lock R brake with cues, but is unable to reach L brake with R UE.   Neuromuscular Reeducation: PT and Tech don bari Maxi Sky sling on pt and spend significant amount of time positioning the straps appropriately in order to allow pt to weight bear through legs. 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   Pt's bed mobility and scoot transfer are progressing and pt is close to a 1 person assist for bed mobility and 2 person assist with slideboard. Pt may benefit from bariatric Tyrone sit to stand practice to decrease his apraxia and promote NMR in weightbearing to L LE. Continue per PT POC.   Therapy Documentation Precautions:  Precautions Precautions: Fall Precaution Comments: L neglect, L hemiplegia, morbid obesity Restrictions Weight Bearing Restrictions: No Pain: Pain Assessment Pain Assessment: 0-10 Pain Score: 9  Pain Type: Acute pain Pain Location: Buttocks Pain Orientation: Mid Pain Descriptors / Indicators: Aching Pain Onset: Gradual Pain Intervention(s): Repositioned (lying bed flat decreased his pain) Multiple Pain Sites: Yes 2nd Pain Site Pain Score: 8 Pain Type: Chronic pain Pain Location: Ankle Pain Orientation: Right;Left Pain Descriptors / Indicators: Aching Pain Onset: With Activity Pain Intervention(s): Rest (pushing on ankles to don shoes irritated pt - he thinks possible gout)  See FIM for current functional status  Therapy/Group: Individual Therapy  Gregory Wiley 02/19/2014, 10:26 AM

## 2014-02-19 NOTE — Progress Notes (Signed)
RT assisted patient in starting CPAP using home equipment and nasal prongs.  Patient demonstrated ability to self-place CPAP mask, may need some help in pushing start button.  Patient is familiar with equipment and procedure.

## 2014-02-20 ENCOUNTER — Inpatient Hospital Stay (HOSPITAL_COMMUNITY): Payer: 59 | Admitting: Occupational Therapy

## 2014-02-20 ENCOUNTER — Inpatient Hospital Stay (HOSPITAL_COMMUNITY): Payer: 59 | Admitting: Physical Therapy

## 2014-02-20 LAB — GLUCOSE, CAPILLARY
Glucose-Capillary: 95 mg/dL (ref 70–99)
Glucose-Capillary: 104 mg/dL — ABNORMAL HIGH (ref 70–99)
Glucose-Capillary: 106 mg/dL — ABNORMAL HIGH (ref 70–99)
Glucose-Capillary: 97 mg/dL (ref 70–99)

## 2014-02-20 MED ORDER — GABAPENTIN 100 MG PO CAPS
100.0000 mg | ORAL_CAPSULE | Freq: Three times a day (TID) | ORAL | Status: DC
  Administered 2014-02-20 – 2014-03-15 (×68): 100 mg via ORAL
  Filled 2014-02-20 (×73): qty 1

## 2014-02-20 NOTE — Progress Notes (Signed)
Occupational Therapy Session Note  Patient Details  Name: Gregory Wiley MRN: [id number] Date of Birth: 11-02-1950  Today's Date: 02/20/2014 OT Individual Time: 0730-0830 OT Individual Time Calculation (min): 60 min    Skilled Therapeutic Interventions/Progress Updates: ADL supine and lateral rolls in bed. Required 2 persons for self care and lateral rolls.  Patient very labored with all aspects of self care and bed mobility.  He complained of right and left ankle pain with all repositions by clincians, and he stated that his ankles hurt even when he lay still.  He stated, "they say it is Gout."  This clincian applied ice to patients ankles after session.      Therapy Documentation Precautions:  Precautions Precautions: Fall Precaution Comments: L neglect, L hemiplegia, morbid obesity Restrictions Weight Bearing Restrictions: No  Pain: constant bilateral ankles.  Ice applied at end of session.  He did not rate the pain  See FIM for current functional status  Therapy/Group: Individual Therapy  [facility] 02/20/2014, 1:57 PM

## 2014-02-20 NOTE — Progress Notes (Signed)
Gregory Wiley is a 63 y.o. male 01-Sep-1950 [id number]  Subjective: complains of right foot pain, describes as "rocks in my shoe". No other new problems. Feeling OK.  Objective: Vital signs in last 24 hours: Temp:  [98 F (36.7 C)-98.1 F (36.7 C)] 98 F (36.7 C) (12/06 0515) Pulse Rate:  [60-70] 60 (12/06 0515) Resp:  [18-20] 19 (12/06 0515) BP: (137-146)/(65-79) 146/65 mmHg (12/06 0515) SpO2:  [96 %-97 %] 97 % (12/06 0515) Weight change:  Last BM Date: 02/17/14  Intake/Output from previous day: 12/05 0701 - 12/06 0700 In: 1210 [P.O.:1200; I.V.:10] Out: 1325 [Urine:1325]  Physical Exam General: No apparent distress,   Obese, supine, intermittently falling asleep during conversation (not wearing CPAP) Lungs: Normal effort. Lungs clear to auscultation anterior, no crackles or wheezes. Cardiovascular: Regular rate and rhythm, no edema Skin: Right foot without edema, erythema rash, bruise or other lesion. diminished proprioception and sensory to touch Wounds: N/A      Lab Results: BMET    Component Value Date/Time   NA 138 02/19/2014 0450   K 4.3 02/19/2014 0450   CL 101 02/19/2014 0450   CO2 24 02/19/2014 0450   GLUCOSE 107* 02/19/2014 0450   BUN 21 02/19/2014 0450   CREATININE 1.04 02/19/2014 0450   CREATININE 1.12 01/04/2013 1206   CALCIUM 9.0 02/19/2014 0450   GFRNONAA 74* 02/19/2014 0450   GFRAA 86* 02/19/2014 0450   CBC    Component Value Date/Time   WBC 9.8 02/16/2014 0455   RBC 4.23 02/16/2014 0455   HGB 11.6* 02/16/2014 0455   HCT 36.1* 02/16/2014 0455   PLT 192 02/16/2014 0455   MCV 85.3 02/16/2014 0455   MCH 27.4 02/16/2014 0455   MCHC 32.1 02/16/2014 0455   RDW 13.8 02/16/2014 0455   LYMPHSABS 1.8 02/16/2014 0455   MONOABS 1.0 02/16/2014 0455   EOSABS 0.1 02/16/2014 0455   BASOSABS 0.0 02/16/2014 0455   CBG's (last 3):    Recent Labs  02/19/14 1123 02/19/14 2105 02/20/14 0728  GLUCAP 123* 127* 95   LFT's Lab Results  Component Value  Date   ALT 16 02/16/2014   AST 21 02/16/2014   ALKPHOS 73 02/16/2014   BILITOT 0.7 02/16/2014    Studies/Results: No results found.  Medications:  I have reviewed the patient's current medications. Scheduled Medications: . aspirin EC  81 mg Oral Daily  . atorvastatin  80 mg Oral q1800  . clopidogrel  75 mg Oral Daily  . enoxaparin (LOVENOX) injection  80 mg Subcutaneous Q24H  . fluticasone  2 spray Each Nare Daily  . pantoprazole sodium  40 mg Per Tube BID  . predniSONE  5 mg Oral 4X daily taper  . sennosides  10 mL Oral BID  . 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 Functional deficits secondary to Right MCA infarct with left hemiparesis, dysarthria, dysphagia, and visual spatial deficits 2. DVT Prophylaxis/Anticoagulation: Pharmaceutical: Lovenox 3. New onset Headaches/Pain Management: Continue tylenol prn for now. Left ankle pain is gout -improved on steroid burst and taper. 4.  Mood: Poor frustration tolerance noted. LCSW to follow for evaluation and support.  5. Neuropsych: This patient is not capable of making decisions on his  own behalf. 6. Skin/Wound Care: Routine pressure relief measures. Add gabapentin for neuropathic pain 7. Fluids/Electrolytes/Nutrition: Monitor I/O. Check follow up labs in am. Offer supplements as indicated if intake poor.  8. HTN: Will monitor every 8 hours. Currently controlled of medications (and to avoid hypotension). will resume medications as indicated. Continue  9. OSA: encouraged to continue CPAP (has home machine) whenever asleep.  Add Flonase for allergic rhinitis symptoms  10. Recent esophagitis/duodenitis due to gastric ulcer: Protonix bid to help promote healing 11. CAD: Has chest wall pain due to poorly Healed sternum. Continue ASA, plavix, lipitor--off imdur and coreg  12. ABLA due to GI loss: 11.6 hgb HD stable  Length of stay, days: 5   Valerie A. Asa Lente, MD 02/20/2014, 9:42 AM

## 2014-02-20 NOTE — Progress Notes (Signed)
Physical Therapy Session Note  Patient Details  Name: Gregory Wiley MRN: [id number] Date of Birth: 12-30-1950  Today's Date: 02/20/2014 PT Individual Time: 1000-1104 and 1400-1500 PT Individual Time Calculation (min): 64 min and 60 min  Short Term Goals: Week 1:  PT Short Term Goal 1 (Week 1): Pt will perform rolling to L with min A and to R with total A x 1.  PT Short Term Goal 2 (Week 1): Pt will perform sit <> supine with total A x 1.  PT Short Term Goal 3 (Week 1): Pt will perform bed <> w/c transfer with +2 assist.  PT Short Term Goal 4 (Week 1): Pt will maintain dynamic sitting balance x 3 min with supervision.  PT Short Term Goal 5 (Week 1): Pt will propel w/c x 50 ft with min A.  Skilled Therapeutic Interventions/Progress Updates:   Session 1: Pt received sidelying in bed, reporting need to urinate. Pt attempted to void with therapist holding urinal in sidelying and sitting edge of bed with increased time but pt unable void. Pt transferred L sidelying > sit with HOB slightly raised and use of rail with +2 assist. Pt performed slide board transfer bed > w/c with +2 assist and L ankle pain. Due to increased R ankle pain, pt propelled w/c x 100 ft using RUE only with max A. Pt reporting increase in sternal pain with popping with pushing wheelchair and states that he has pain when he "overdoes it" s/p CABG 2012. RN notified and administered pain medication. Pt's wife arriving and verified that patient's chest "did not heal properly" following heart surgery. With use of Clarise Cruz Plus without waist sling and foot plate removed, patient performed sit <> stand x 2 with LUE secured to platform with ace wrap and +2 assist, therapist stabilizing L knee to prevent it from buckling. Pt able to remain standing 45-60 sec and  20-30 sec respectively before needing to sit due to increased B shoulder pain. Patient able to maintain R knee against shin plate of Sara Plus to maintain proper foot positioning and LLE  wide of knee plate due to body habitus. Pt returned to room with quick release belt on and 1/2 L lap tray on w/c with wife present, RN notified to supervise pt for water protocol. Patient very limited by pain in shoulders, ankles, chest, and reports neck pain with sitting in wheelchair for prolonged period of time.   Session 2: Pt received sitting in w/c, reporting buttocks pain from sitting since last session. Pt's wife reports that nursing was able to tilt pt back in standard wheelchair. Session focused on w/c seating and positioning for improved OOB tolerance and pressure relief. Pt performed sit <> stand using Clarise Cruz Plus with +3 assist and L UE secured on arm tray with ace wrap while w/c was switched to 20 x 18 tilt-in-space wheelchair. Pt also provided with pressure relieving cushion due to inability to perform effective pressure relief for skin integrity. Pt fitted with leg rests for wheelchair that will need further adjustments for optimal positioning. At end of session after switching to new w/c, patient reports that something is digging into his L thigh. Due to body habitus, patient sits with BLE abducted out to sides with increased pressure noted by redness on legs at edges of cushion. Will continue to work on optimal positioning for patient. Pt performed multiple sit <> stand transfers with +2 assist using Clarise Cruz Plus and therapist stabilizing L knee to prevent buckling. Pt able  to tolerate well with L ankle but continues to have activity-limiting shoulder pain. During last standing trial, MaxiSky sling positioned behind patient. Due to pt fatigue/lethargy, pt returned to bed via MaxiSky with +2 assist and performed rolling to L with max A x 1 to remove sling. Pt left supine in bed with bed alarm on and wife present.   Therapy Documentation Precautions:  Precautions Precautions: Fall Precaution Comments: L neglect, L hemiplegia, morbid obesity Restrictions Weight Bearing Restrictions:  No Pain: Pain Assessment Pain Score: 5  Pain Type: Acute pain Pain Location: Foot Pain Orientation: Right;Left Pain Intervention(s): Medication (See eMAR)  See FIM for current functional status  Therapy/Group: Individual Therapy  Laretta Alstrom 02/20/2014, 11:28 AM

## 2014-02-21 ENCOUNTER — Inpatient Hospital Stay (HOSPITAL_COMMUNITY): Payer: 59 | Admitting: Physical Therapy

## 2014-02-21 ENCOUNTER — Inpatient Hospital Stay (HOSPITAL_COMMUNITY): Payer: 59 | Admitting: Speech Pathology

## 2014-02-21 ENCOUNTER — Encounter (HOSPITAL_COMMUNITY): Payer: 59 | Admitting: Occupational Therapy

## 2014-02-21 DIAGNOSIS — K5901 Slow transit constipation: Secondary | ICD-10-CM

## 2014-02-21 LAB — URINE CULTURE: Colony Count: 60000

## 2014-02-21 LAB — GLUCOSE, CAPILLARY: Glucose-Capillary: 112 mg/dL — ABNORMAL HIGH (ref 70–99)

## 2014-02-21 MED ORDER — ENSURE PUDDING PO PUDG
1.0000 | Freq: Two times a day (BID) | ORAL | Status: DC
  Administered 2014-02-22 – 2014-02-25 (×3): 1 via ORAL

## 2014-02-21 MED ORDER — SENNOSIDES-DOCUSATE SODIUM 8.6-50 MG PO TABS
2.0000 | ORAL_TABLET | Freq: Two times a day (BID) | ORAL | Status: DC
  Administered 2014-02-21: 2 via ORAL
  Filled 2014-02-21: qty 2

## 2014-02-21 MED ORDER — SENNOSIDES 8.8 MG/5ML PO SYRP
5.0000 mL | ORAL_SOLUTION | Freq: Two times a day (BID) | ORAL | Status: DC
  Administered 2014-02-21 – 2014-03-15 (×40): 5 mL via ORAL
  Filled 2014-02-21 (×49): qty 5

## 2014-02-21 MED ORDER — PRO-STAT SUGAR FREE PO LIQD
30.0000 mL | Freq: Two times a day (BID) | ORAL | Status: DC
  Administered 2014-02-21 – 2014-03-13 (×25): 30 mL via ORAL
  Filled 2014-02-21 (×47): qty 30

## 2014-02-21 NOTE — Progress Notes (Signed)
Placed patient on CPAP via nasal prongs (home equipment) auto settings.

## 2014-02-21 NOTE — Progress Notes (Signed)
Occupational Therapy Session Note  Patient Details  Name: Gregory Wiley MRN: [id number] Date of Birth: 1950/07/25  Today's Date: 02/21/2014 OT Individual Time: 0800-0900 OT Individual Time Calculation (min): 60 min    Short Term Goals: Week 1:  OT Short Term Goal 1 (Week 1): Pt will roll to the right side in bed during LB selfcare with max assist and mod instructional cueing for sequencing. OT Short Term Goal 2 (Week 1): Pt's wife will return demonstrate safe assist with LUE PROM exercises. OT Short Term Goal 3 (Week 1): Pt will complete UB bathing sitting EOB unsupported and min assist.  OT Short Term Goal 4 (Week 1): Pt will perform UB dressing with min assist and mod instructional cueing.   OT Short Term Goal 5 (Week 1): Pt will perform toilet transfer sliding board or squat pivot with total assist +2 (pt 25%) to bariatric drop arm commode.   Skilled Therapeutic Interventions/Progress Updates:    Pt performed bathing and dressing supine to sit EOB this session.  He was able to roll in the bed to the left with mod assist and to the right with total assist +2 (pt 25%) in order for therapist to clean up BM, donn brief, and then donn shorts.  Pt continues to need mod questioning cueing to sequence rolling as well to the right side.  Transitioned to sitting from left  sidelying with total assist of 1 person.  Pt attempts to help but is currently dependent as the LUE is still only Brunnstrum stage II in the arm and I in the hand.  Noted trace active shoulder horizontal adduction when hand over hand assist was provided with the LUE as he attempted to wash his RUE.  Close supervision for all static sitting balance with min assist needed when attempting to reach down to the LLE and wash it.  He needed mod demonstrational cueing for hemi technique to donn pullover shirt.  Noted pt scanning to the left better throughout session but still needs mod questioning cues to locate grooming and bathing items left  of midline.  Total assist +2 for transfer to wheelchair via sliding board.  Pt positioned in tilt in space in order to finish breakfast.  Half lap tray also provided for positioning of the LUE.    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  Restrictions Weight Bearing Restrictions: No  Pain: Pain Assessment Pain Assessment: 0-10 Pain Score: Asleep Faces Pain Scale: Hurts whole lot (With movement to LLE) Pain Type: Acute pain Pain Location: Ankle Pain Orientation: Left Pain Descriptors / Indicators: Sharp Pain Frequency: Constant Pain Onset: With Activity Patients Stated Pain Goal: 3 Pain Intervention(s): Repositioned;RN made aware;Rest Multiple Pain Sites: No ADL: See FIM for current functional status  Therapy/Group: Individual Therapy  Neesha Langton OTR/L 02/21/2014, 12:44 PM

## 2014-02-21 NOTE — Progress Notes (Signed)
Subjective/Complaints: Still requiring suppository, no abd pain, nausea or vomiting Review of Systems - Negative except above      Objective: Vital Signs: Blood pressure 128/65, pulse 73, temperature 98 F (36.7 C), temperature source Oral, resp. rate 19, weight 157.398 kg (347 lb), SpO2 95 %. No results found. Results for orders placed or performed during the hospital encounter of 02/15/14 (from the past 72 hour(s))  Glucose, capillary     Status: Abnormal   Collection Time: 02/18/14 11:30 AM  Result Value Ref Range   Glucose-Capillary 124 (H) 70 - 99 mg/dL   Comment 1 Notify RN   Glucose, capillary     Status: Abnormal   Collection Time: 02/18/14  4:31 PM  Result Value Ref Range   Glucose-Capillary 142 (H) 70 - 99 mg/dL   Comment 1 Notify RN   Glucose, capillary     Status: Abnormal   Collection Time: 02/18/14  9:23 PM  Result Value Ref Range   Glucose-Capillary 118 (H) 70 - 99 mg/dL  Urinalysis, Routine w reflex microscopic     Status: Abnormal   Collection Time: 02/18/14 10:25 PM  Result Value Ref Range   Color, Urine YELLOW YELLOW   APPearance CLOUDY (A) CLEAR   Specific Gravity, Urine 1.026 1.005 - 1.030   pH 6.0 5.0 - 8.0   Glucose, UA NEGATIVE NEGATIVE mg/dL   Hgb urine dipstick NEGATIVE NEGATIVE   Bilirubin Urine NEGATIVE NEGATIVE   Ketones, ur NEGATIVE NEGATIVE mg/dL   Protein, ur NEGATIVE NEGATIVE mg/dL   Urobilinogen, UA 0.2 0.0 - 1.0 mg/dL   Nitrite NEGATIVE NEGATIVE   Leukocytes, UA SMALL (A) NEGATIVE  Urine microscopic-add on     Status: None   Collection Time: 02/18/14 10:25 PM  Result Value Ref Range   Squamous Epithelial / LPF RARE RARE   WBC, UA 11-20 <3 WBC/hpf   RBC / HPF 0-2 <3 RBC/hpf   Bacteria, UA RARE RARE  Culture, Urine     Status: None (Preliminary result)   Collection Time: 02/18/14 10:26 PM  Result Value Ref Range   Specimen Description URINE, CLEAN CATCH    Special Requests NONE    Culture  Setup Time      02/19/2014  03:39 Performed at [facility]      60,000 COLONIES/ML Performed at [facility] (COAGULASE NEGATIVE) Note: RIFAMPIN AND GENTAMICIN SHOULD NOT BE USED AS SINGLE DRUGS FOR TREATMENT OF STAPH INFECTIONS. Performed at [facility]    Report Status PENDING   Basic metabolic panel     Status: Abnormal   Collection Time: 02/19/14  4:50 AM  Result Value Ref Range   Sodium 138 137 - 147 mEq/L   Potassium 4.3 3.7 - 5.3 mEq/L   Chloride 101 96 - 112 mEq/L   CO2 24 19 - 32 mEq/L   Glucose, Bld 107 (H) 70 - 99 mg/dL   BUN 21 6 - 23 mg/dL   Creatinine, Ser 1.04 0.50 - 1.35 mg/dL   Calcium 9.0 8.4 - 10.5 mg/dL   GFR calc non Af Amer 74 (L) >90 mL/min   GFR calc Af Amer 86 (L) >90 mL/min    Comment: (NOTE) The eGFR has been calculated using the CKD EPI equation. This calculation has not been validated in all clinical situations. eGFR's persistently <90 mL/min signify possible Chronic Kidney Disease.    Anion gap 13 5 -  15  Glucose, capillary     Status: Abnormal   Collection Time: 02/19/14  6:39 AM  Result Value Ref Range   Glucose-Capillary 110 (H) 70 - 99 mg/dL  Glucose, capillary     Status: Abnormal   Collection Time: 02/19/14 11:23 AM  Result Value Ref Range   Glucose-Capillary 123 (H) 70 - 99 mg/dL   Comment 1 Notify RN   Glucose, capillary     Status: Abnormal   Collection Time: 02/19/14  9:05 PM  Result Value Ref Range   Glucose-Capillary 127 (H) 70 - 99 mg/dL  Glucose, capillary     Status: None   Collection Time: 02/20/14  7:28 AM  Result Value Ref Range   Glucose-Capillary 95 70 - 99 mg/dL  Glucose, capillary     Status: Abnormal   Collection Time: 02/20/14 11:36 AM  Result Value Ref Range   Glucose-Capillary 104 (H) 70 - 99 mg/dL  Glucose, capillary     Status: Abnormal   Collection Time: 02/20/14  4:22 PM  Result Value Ref Range   Glucose-Capillary 106 (H) 70 - 99 mg/dL  Glucose,  capillary     Status: None   Collection Time: 02/20/14  8:49 PM  Result Value Ref Range   Glucose-Capillary 97 70 - 99 mg/dL   Comment 1 Notify RN      HEENT: normal Cardio: RRR and no murmur Resp: CTA B/L and unlabored GI: BS positive and NT, ND Extremity:  Pulses positive and No Edema Skin:   Intact Neuro: Alert/Oriented, Cranial Nerve Abnormalities Left central 7, Normal Sensory, Abnormal Motor 0/5 LUE and LLE persistent. Does sense pain. , Dysarthric and left Inattention, right gaze preference better Musc/Skel:  Other no pain with Left shoulder ROM Gen NAD   Assessment/Plan: 1. 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 Physiatrist is providing close team supervision and 24 hour management of active medical problems listed below. Physiatrist and rehab team continue to assess barriers to discharge/monitor patient progress toward functional and medical goals.  FIM: FIM - Bathing Bathing Steps Patient Completed: Chest, Front perineal area, Right upper leg Bathing: 1: Two helpers  FIM - Upper Body Dressing/Undressing Upper body dressing/undressing steps patient completed: Thread/unthread right sleeve of pullover shirt/dresss, Put head through opening of pull over shirt/dress Upper body dressing/undressing: 1: Two helpers FIM - Lower Body Dressing/Undressing Lower body dressing/undressing: 1: Two helpers  FIM - Toileting Toileting: 0: Activity did not occur  FIM - Air cabin crew Transfers: 0-Activity did not occur  FIM - Control and instrumentation engineer Devices: Bed rails, Sliding board, HOB elevated Bed/Chair Transfer: 1: Two helpers  FIM - Locomotion: Wheelchair Distance: 100 Locomotion: Wheelchair: 2: Travels 50 - 149 ft with maximal assistance (Pt: 25 - 49%) FIM -  Locomotion: Ambulation Locomotion: Ambulation: 0: Activity did not occur  Comprehension Comprehension Mode: Auditory Comprehension: 2-Understands basic 25 - 49% of the time/requires cueing 51 - 75% of the time  Expression Expression Mode: Verbal Expression: 3-Expresses basic 50 - 74% of the time/requires cueing 25 - 50% of the time. Needs to repeat parts of sentences.  Social Interaction Social Interaction: 2-Interacts appropriately 25 - 49% of time - Needs frequent redirection.  Problem Solving Problem Solving: 2-Solves basic 25 - 49% of the time - needs direction more than half the time to initiate, plan or complete simple activities  Memory Memory: 3-Recognizes or recalls 50 - 74% of the time/requires cueing 25 - 49% of the time  Medical Problem List and Plan: 1. Functional deficits secondary to Right MCA infarct with left hemiparesis, dysarthria, dysphagia, and visual spatial deficits 2. DVT Prophylaxis/Anticoagulation: Pharmaceutical: Lovenox 3. New onset Headaches/Pain Management: Continue tylenol prn for now. Left ankle pain is gout  , start steroid burst and taper. Mood: Poor frustration tolerance noted. LCSW to follow for evaluation and support. Monitor CBG on prednisone 5. Neuropsych: This patient is not capable of making decisions on his own behalf. 6. Skin/Wound Care: Routine pressure relief measures.  7. Fluids/Electrolytes/Nutrition: Monitor I/O. Check follow up labs in am. Offer supplements as indicated if intake poor.  8. HTN: Will monitor every 8 hours. Currently controlled of medications (and to avoid hypotension). will resume medications as indicated. Continue  9. H/o DM type 2?: Hgb A1c- 5.6 . Patient and wife deny any history of diabetes  -dc  cbg checks 10 OSA: Continue CPAP (has home machine) whenever asleep.  11. Recent esophagitis/duodenitis due to gastric ulcer: Protonix bid to help promote healing 12. CAD: Has chest wall pain due to poorly Healed  sternum. Continue ASA, plavix, lipitor--off imdur and coreg  13. ABLA: 11.6 hgb  14.  Coag neg staph in urine, no fever or dysuria, LOS (Days) 6 A FACE TO FACE EVALUATION WAS PERFORMED  KIRSTEINS,ANDREW E 02/21/2014, 7:23 AM

## 2014-02-21 NOTE — Progress Notes (Signed)
Physical Therapy Session Note  Patient Details  Name: Gregory Wiley MRN: [id number] Date of Birth: 1950/05/03  Today's Date: 02/21/2014 PT Individual Time: 0930-1030 and 1330-1400 PT Individual Time Calculation (min): 60 min and 30 min Short Term Goals: Week 1:  PT Short Term Goal 1 (Week 1): Pt will perform rolling to L with min A and to R with total A x 1.  PT Short Term Goal 2 (Week 1): Pt will perform sit <> supine with total A x 1.  PT Short Term Goal 3 (Week 1): Pt will perform bed <> w/c transfer with +2 assist.  PT Short Term Goal 4 (Week 1): Pt will maintain dynamic sitting balance x 3 min with supervision.  PT Short Term Goal 5 (Week 1): Pt will propel w/c x 50 ft with min A.  Skilled Therapeutic Interventions/Progress Updates:   Session 1: Pt received asleep semi reclined in bed, awakened to light touch/voice and agreeable to therapy. Pt transferred supine > sit with HOB flat and use of rail with +2 assist for elevating trunk and positioning BLE. Pt attempted slide board transfer bed > w/c with +2 assist but pt unable to achieve anterior weight shift in order to clear hips and pt repeatedly leaning backward and turning head toward wheelchair instead of away from chair. Seated edge of bed pt unwilling to attempt anterior weight shift with cues to reach forward and touch floor, stating, "I can't, my stomach gets in the way." Pt shouting out in pain with WB through L ankle due to pain. Pt also c/o pain in scrotum due to slide board placement. From edge of bed, pt performed sit <> stand x 3 with +2 assist and use of Sara Plus with foot plate removed and no sling, with LUE secured to arm tray with ace wrap, therapist stabilizing L knee to prevent buckling. Pt able to stand up to 60 sec at a time before requesting to sit due to L ankle pain. With +3 assist, initiated gait training with rehab tech guiding Clarise Cruz and two therapists at each side. Pt advanced LLE with total A and unable to advance RLE  with therapist stabilizing L knee due to increased pain with L LE weightbearing. Pt returned to sitting edge of bed and reporting need for urinal. Transferred sit > supine with +2 assist and repositioned up higher in bed, pt able to assist by pulling up with RUE. With HOB elevated, pt able to position and use urinal with increased time and total A for clothing management. Pt left semi reclined in bed with all needs within reach and bed alarm on. Will trial elevating leg rests at next session for improved BLE positioning in wheelchair to decrease abduction and pressure on back of thighs, as well as decrease pain in L ankle.   Session 2: Pt received asleep in bed, awakened to voice. Pt reporting he thinks he had incontinent bowel episode. Pt performed rolling multiple times to R and L using rails and +2 assist to doff/don brief and hygiene with total A. Pt transferred supine > sit with rail and +2 assist. Remainder of session focused on slide board transfer to R bed > w/c with +3 assist with wife stabilizing chair. Continued emphasis on anterior weight shift and head hips relationship. Therapist in front of patient blocking at L knee to prevent forward translation off of slide board. Pt shouting out in pain with all contact of L foot on floor for WB. Pt completed transfer with  greatly increased time and many rest breaks due to pain. Pt requires total A x 2 to reposition hips back in chair with use of tilt in space function. Pt provided with elevating leg rests with towel roll to decrease pressure on side of legrests for improved comfort and BLE positioning in chair. Pt left sitting in w/c with wife present.   Therapy Documentation Precautions:  Precautions Precautions: Fall Precaution Comments: L neglect, L hemiplegia, morbid obesity Restrictions Weight Bearing Restrictions: No Pain: Pain Assessment Pain Assessment: 0-10 Pain Score: 5  Faces Pain Scale: Hurts whole lot (With movement to LLE) Pain Type:  Acute pain Pain Location: Ankle Pain Orientation: Left Pain Descriptors / Indicators: Sharp Pain Frequency: Constant Pain Onset: With Activity Patients Stated Pain Goal: 3 Pain Intervention(s): Repositioned;RN made aware;Rest Multiple Pain Sites: No  See FIM for current functional status  Therapy/Group: Individual Therapy  Laretta Alstrom 02/21/2014, 10:43 AM

## 2014-02-21 NOTE — Plan of Care (Signed)
Problem: RH BLADDER ELIMINATION Goal: RH STG MANAGE BLADDER WITH ASSISTANCE STG Manage Bladder With Total Assistance  Outcome: Progressing  Problem: RH SKIN INTEGRITY Goal: RH STG SKIN FREE OF INFECTION/BREAKDOWN Min Assist  Outcome: Progressing Goal: RH STG MAINTAIN SKIN INTEGRITY WITH ASSISTANCE STG Maintain Skin Integrity With [place].  Outcome: Progressing

## 2014-02-21 NOTE — Progress Notes (Signed)
Speech Language Pathology Daily Session Note  Patient Details  Name: Gregory Wiley MRN: [id number] Date of Birth: 09/26/1950  Today's Date: 02/21/2014 SLP Individual Time: 1430-1508 SLP Individual Time Calculation (min): 38 min  Short Term Goals: Week 1: SLP Short Term Goal 1 (Week 1): Patien will consume Dys. 2 textures and nectar-thick liquids via straw with Mod cues for recall and use of safe swallow strategies to miimize overt s/s of aspiration SLP Short Term Goal 2 (Week 1): Patient will recall and scan to the left of midline to locate items as needed, during basic functional tasks with Mod mulitmodal cues  SLP Short Term Goal 3 (Week 1): Patient will produce intelligible speech at the phrase-sentence level of vebral expression with Min vebral cues to utilize speech intelligibilty strategies SLP Short Term Goal 4 (Week 1): Patient will label 2 physical and 2 cognitive deficits with Mod question cues SLP Short Term Goal 5 (Week 1): Patient will sustain attention to basic tasks for 1 minute with Mod verbal cues for redirections  Skilled Therapeutic Interventions:  Pt was seen for skilled ST targeting dysphagia goals.  Upon arrival, pt was sitting up in tilt in space wheelchair, lethargic, but agreeable to participate in [place].  SLP facilitated the session with supervision cues for initiation and sequencing of thorough oral care prior to trials of regular water.  SLP also completed skilled observations during the abovementioned trials with no overt s/s of aspiration noted at bedside.  Pt's wife became tearful during session and asked to speak with SLP outside of room.  Wife reports that pt appears very depressed with decreased motivation to participate in therapy and requested that someone come "to talk with him."  SLP informed pt's wife of neuropsych services available while on CIR and will initiate referral process.  SLP also provided skilled education related to pt's clinical presentation given  location of CVA, sequela of stroke recovery, and realistic prognostic expectations for recovery given pt's progress made thus far.  All pt's wife's questions were answered to her satisfaction at this time. Continue per current plan of care.      FIM:  Comprehension Comprehension Mode: Auditory Comprehension: 5-Understands basic 90% of the time/requires cueing < 10% of the time Expression Expression Mode: Verbal Expression: 3-Expresses basic 50 - 74% of the time/requires cueing 25 - 50% of the time. Needs to repeat parts of sentences. Social Interaction Social Interaction: 2-Interacts appropriately 25 - 49% of time - Needs frequent redirection. Problem Solving Problem Solving: 2-Solves basic 25 - 49% of the time - needs direction more than half the time to initiate, plan or complete simple activities Memory Memory: 4-Recognizes or recalls 75 - 89% of the time/requires cueing 10 - 24% of the time FIM - Eating Eating Activity: 5: Needs verbal cues/supervision;4: Help with picking up utensils  Pain Pain Assessment Pain Assessment: No/denies pain  Therapy/Group: Individual Therapy   Windell Moulding, M.A. CCC-SLP  Julena Barbour, Selinda Orion 02/21/2014, 4:08 PM

## 2014-02-21 NOTE — Progress Notes (Signed)
INITIAL NUTRITION ASSESSMENT  DOCUMENTATION CODES Per approved criteria  -Morbid Obesity   INTERVENTION: Provide Ensure Pudding po BID, each supplement provides 170 kcal and 4 grams of protein.  Provide 30 ml Prostat po BID, each supplement provides 100 kcal and 15 grams of protein.   Encourage adequate PO intake.  NUTRITION DIAGNOSIS: Inadequate oral intake related to dysphagia, s/p stroke as evidenced by varied meal completion of 0-100%.   Goal: Pt to meet >/= 90% of their estimated nutrition needs   Monitor:  PO intake, weight trends, labs, I/O's  Reason for Assessment: Low Braden Score  63 y.o. 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 MRI brain revealed acute infarct right MCA territory involving the right temporal lobe, insula, and basal ganglia and abrupt occlusion right M1 segment question due to embolus.  Meal completion has varied from 0-100%. Unable to obtain nutrition hx during time of visit as pt was unresponsive and no family was at bedside. Pt did however nod in agreement to oral supplements. Will order. Weight has been stable per Epic weight records. Pt with no observed significant fat or muscle mass loss.   Height: Ht Readings from Last 1 Encounters:  02/12/14 6' (1.829 m)    Weight: Wt Readings from Last 1 Encounters:  02/15/14 347 lb (157.398 kg)    Ideal Body Weight: 178 lbs  % Ideal Body Weight: 195%  Wt Readings from Last 10 Encounters:  02/15/14 347 lb (157.398 kg)  02/12/14 350 lb (158.759 kg)  02/02/14 347 lb (157.398 kg)  02/01/14 347 lb (157.398 kg)  08/02/13 347 lb (157.398 kg)  06/29/13 340 lb (154.223 kg)  02/10/13 340 lb (154.223 kg)  01/25/13 344 lb (156.037 kg)  01/04/13 340 lb 6.4 oz (154.404 kg)  07/03/11 327  lb 12.8 oz (148.689 kg)    Usual Body Weight: 347 lbs  % Usual Body Weight: 100%  BMI:  Body mass index is 47.05 kg/(m^2). Morbid obesity  Estimated Nutritional Needs: Kcal: 2100-2400 Protein: 120-150 grams Fluid: 2.1 - 2.4 L/day  Skin: Incision left chest, non-pitting generalized edema  Diet Order: DIET DYS 2 with nectar thick liquids  EDUCATION NEEDS: -Education not appropriate at this time   Intake/Output Summary (Last 24 hours) at 02/21/14 1026 Last data filed at 02/21/14 1021  Gross per 24 hour  Intake    670 ml  Output   1275 ml  Net   -605 ml    Last BM: 12/7  Labs:   Recent Labs Lab 02/15/14 0445 02/16/14 0455 02/19/14 0450  NA 138 141 138  K 3.7 3.9 4.3  CL 101 104 101  CO2 23 23 24   BUN 11 12 21   CREATININE 1.04 1.04 1.04  CALCIUM 8.6 8.6 9.0  GLUCOSE 104* 111* 107*    CBG (last 3)   Recent Labs  02/20/14 1136 02/20/14 1622 02/20/14 2049  GLUCAP 104* 106* 97    Scheduled Meds: . aspirin EC  81 mg Oral Daily  . atorvastatin  80 mg Oral q1800  . clopidogrel  75 mg Oral Daily  . enoxaparin (LOVENOX) injection  80 mg Subcutaneous Q24H  . fluticasone  2 spray Each Nare Daily  . gabapentin  100 mg Oral TID  . pantoprazole sodium  40 mg Per Tube BID  . predniSONE  5  mg Oral 4X daily taper  . senna-docusate  2 tablet Oral BID  . sodium chloride 0.9 % 1,000 mL infusion   Intravenous Daily    Continuous Infusions:   Past Medical History  Diagnosis Date  . Coronary artery disease     stent, then CABG 07/20/10  . MI (myocardial infarction)     NSTEMI- last cath 06/2011-Stent to LCX-DES, last nuc 07/30/11 low risk  . Diabetes mellitus     hx of, diet controlled  . Hypertension   . Hyperlipidemia   . Obesity (BMI 30-39.9)   . OSA on CPAP     since July 2013  . H/O cardiomyopathy     ischemic, now last echo 07/30/11, EF 38%W  . Diverticulosis   . Internal hemorrhoids   . Tubular adenoma of colon   . Plantar fasciitis of left foot   .  Stroke     Past Surgical History  Procedure Laterality Date  . Coronary artery bypass graft  07/20/2010     LIMA-LAD; VG-ACUTE MARG of RCA; Seq VG-distal RCA & then pda  . Coronary angioplasty with stent placement  07/01/2011    DES-Resolute to native LCX  . Coronary angioplasty with stent placement  12/01/2007    COMPLEX 5 LESION PCI INCLUDING CUTTING BALLOON AND 4 CYPHER DESs  . Hernia repair  2006  . Knee surgery Right   . Hand surgery      to take glass out  . Colonoscopy N/A 02/10/2013    Procedure: COLONOSCOPY;  Surgeon: Ladene Artist, MD;  Location: [facility];  Service: Endoscopy;  Laterality: N/A;  . Retinal detachment surgery    . Loop recorder implant  02/15/2014    MDT LINQ implanted by Dr Rayann Heman for cryptogenic stroke  . Tee without cardioversion N/A 02/15/2014    Procedure: TRANSESOPHAGEAL ECHOCARDIOGRAM (TEE);  Surgeon: Josue Hector, MD;  Location: [facility];  Service: Cardiovascular;  Laterality: N/A;    Kallie Locks, MS, RD, LDN Pager # [phone] After hours/ weekend pager # [phone]

## 2014-02-21 NOTE — Progress Notes (Signed)
Complained of Posterior neck pain, declined med for pain. Hot pack applied with relief. Restless night. Requires assist with placing, holding and emptying urinal. Incontinent at times. NS infusing at 50cc/hr, 1800-0600, nightly. LBM 12/03, agreeing to take supp this AM. Takes CPAP off frequently during the night. Gregory Wiley A

## 2014-02-22 ENCOUNTER — Inpatient Hospital Stay (HOSPITAL_COMMUNITY): Payer: 59 | Admitting: *Deleted

## 2014-02-22 ENCOUNTER — Inpatient Hospital Stay (HOSPITAL_COMMUNITY): Payer: 59 | Admitting: Speech Pathology

## 2014-02-22 ENCOUNTER — Encounter (HOSPITAL_COMMUNITY): Payer: 59 | Admitting: Occupational Therapy

## 2014-02-22 DIAGNOSIS — M10071 Idiopathic gout, right ankle and foot: Secondary | ICD-10-CM

## 2014-02-22 LAB — GLUCOSE, CAPILLARY: Glucose-Capillary: 124 mg/dL — ABNORMAL HIGH (ref 70–99)

## 2014-02-22 NOTE — Progress Notes (Signed)
Physical Therapy Session Note  Patient Details  Name: Gregory Wiley MRN: [id number] Date of Birth: 1950-05-26  Today's Date: 02/22/2014 PT Individual Time: 1435-1535 PT Individual Time Calculation (min): 60 min   Short Term Goals: Week 1:  PT Short Term Goal 1 (Week 1): Pt will perform rolling to L with min A and to R with total A x 1.  PT Short Term Goal 2 (Week 1): Pt will perform sit <> supine with total A x 1.  PT Short Term Goal 3 (Week 1): Pt will perform bed <> w/c transfer with +2 assist.  PT Short Term Goal 4 (Week 1): Pt will maintain dynamic sitting balance x 3 min with supervision.  PT Short Term Goal 5 (Week 1): Pt will propel w/c x 50 ft with min A.  Skilled Therapeutic Interventions/Progress Updates:   Pt received asleep in w/c, difficult to arouse to voice and touch and requiring increased time to participate in therapy. Recreational therapist joining for co-treat. In therapy gym, attempted bariatric Stedy for sit <> stand from w/c but unable to get foot plate close enough to patient. Therefore, attempted slide board transfer w/c > mat table to perform Stedy transfer from mat table to get foot plate under patient. Due to increased L ankle pain, pt unable to perform SB transfer to L with +2 total assist. From w/c, patient performed sit <> stand with use of Sara Plus (foot plate removed, no sling) x 5. Pt able to tolerate standing better this date with decreased c/o pain. Pt also able to initiate sit > stand with UE support on arm trays without use of lift with +2 assist, then come to complete stand with Clarise Cruz. Pt repositioned hips back in w/c with +3 assist to stabilize w/c with lateral leans. Pt left in w/c with quick release belt on and all needs within reach.   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  Restrictions Weight Bearing  Restrictions: No Pain: Pain Assessment Pain Assessment: Faces Faces Pain Scale: Hurts whole lot Pain Type: Acute pain Pain Location: Ankle Pain Orientation: Left Pain Onset: With Activity Pain Intervention(s): Repositioned;Rest;Emotional support  See FIM for current functional status  Therapy/Group: Individual Therapy  Laretta Alstrom 02/22/2014, 4:03 PM

## 2014-02-22 NOTE — Progress Notes (Signed)
Per water protocol patients mouth was cleaned before and after drinking water. Nurse supervised, no signs of coughing or any other signs of aspiration. Patient drank about 4 oz of water. Jimmie Molly, RN

## 2014-02-22 NOTE — Plan of Care (Signed)
Problem: RH BOWEL ELIMINATION Goal: RH STG MANAGE BOWEL WITH ASSISTANCE STG Manage Bowel with Mod Assistance.  Outcome: Progressing Goal: RH STG MANAGE BOWEL W/MEDICATION W/ASSISTANCE STG Manage Bowel with Medication with Mod Assistance.  Outcome: Progressing Goal: RH STG MANAGE BOWEL W/EQUIPMENT W/ASSISTANCE STG Manage Bowel With Equipment With Mod Assistance  Outcome: Progressing  Problem: RH BLADDER ELIMINATION Goal: RH STG MANAGE BLADDER WITH ASSISTANCE STG Manage Bladder With Total Assistance  Outcome: Progressing Goal: RH STG MANAGE BLADDER WITH MEDICATION WITH ASSISTANCE STG Manage Bladder With Medication With Mod Assistance.  Outcome: Progressing Goal: RH STG MANAGE BLADDER WITH EQUIPMENT WITH ASSISTANCE STG Manage Bladder With Equipment With Mod Assistance  Outcome: Progressing  Problem: RH SKIN INTEGRITY Goal: RH STG SKIN FREE OF INFECTION/BREAKDOWN Min Assist  Outcome: Progressing Goal: RH STG MAINTAIN SKIN INTEGRITY WITH ASSISTANCE STG Maintain Skin Integrity With [place].  Outcome: Progressing  Problem: RH SAFETY Goal: RH STG ADHERE TO SAFETY PRECAUTIONS W/ASSISTANCE/DEVICE STG Adhere to Safety Precautions With Min Assistance/Device.  Outcome: Progressing Goal: RH STG DECREASED RISK OF FALL WITH ASSISTANCE STG Decreased Risk of Fall With [employer].  Outcome: Progressing  Problem: RH COGNITION-NURSING Goal: RH STG ANTICIPATES NEEDS/CALLS FOR ASSIST W/ASSIST/CUES STG Anticipates Needs/Calls for Assist With Mod Assistance/Cues.  Outcome: Progressing  Problem: RH PAIN MANAGEMENT Goal: RH STG PAIN MANAGED AT OR BELOW PT'S PAIN GOAL < 4  Outcome: Progressing  Problem: RH KNOWLEDGE DEFICIT Goal: RH STG INCREASE KNOWLEDGE OF HYPERTENSION Mod Assist  Outcome: Progressing

## 2014-02-22 NOTE — Progress Notes (Signed)
Subjective/Complaints: Pt wears CPAP intermittently , no abd pain, nausea or vomiting Review of Systems - Negative except above      Objective: Vital Signs: Blood pressure 121/65, pulse 65, temperature 97.9 F (36.6 C), temperature source Oral, resp. rate 19, weight 157.398 kg (347 lb), SpO2 97 %. No results found. Results for orders placed or performed during the hospital encounter of 02/15/14 (from the past 72 hour(s))  Glucose, capillary     Status: Abnormal   Collection Time: 02/19/14 11:23 AM  Result Value Ref Range   Glucose-Capillary 123 (H) 70 - 99 mg/dL   Comment 1 Notify RN   Glucose, capillary     Status: Abnormal   Collection Time: 02/19/14  9:05 PM  Result Value Ref Range   Glucose-Capillary 127 (H) 70 - 99 mg/dL  Glucose, capillary     Status: None   Collection Time: 02/20/14  7:28 AM  Result Value Ref Range   Glucose-Capillary 95 70 - 99 mg/dL  Glucose, capillary     Status: Abnormal   Collection Time: 02/20/14 11:36 AM  Result Value Ref Range   Glucose-Capillary 104 (H) 70 - 99 mg/dL  Glucose, capillary     Status: Abnormal   Collection Time: 02/20/14  4:22 PM  Result Value Ref Range   Glucose-Capillary 106 (H) 70 - 99 mg/dL  Glucose, capillary     Status: None   Collection Time: 02/20/14  8:49 PM  Result Value Ref Range   Glucose-Capillary 97 70 - 99 mg/dL   Comment 1 Notify RN   Glucose, capillary     Status: Abnormal   Collection Time: 02/21/14  5:03 PM  Result Value Ref Range   Glucose-Capillary 112 (H) 70 - 99 mg/dL     HEENT: normal Cardio: RRR and no murmur Resp: CTA B/L and unlabored GI: BS positive and NT, ND Extremity:  Pulses positive and No Edema Skin:   Intact Neuro: Alert/Oriented, Cranial Nerve Abnormalities Left central 7, Normal Sensory, Abnormal Motor 0/5 LUE and LLE persistent. Does sense pain. , Dysarthric and left Inattention, right gaze preference better Musc/Skel:  Other no pain with Left shoulder ROM Gen  NAD   Assessment/Plan: 1. 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 Physiatrist is providing close team supervision and 24 hour management of active medical problems listed below. Physiatrist and rehab team continue to assess barriers to discharge/monitor patient progress toward functional and medical goals.  FIM: FIM - Bathing Bathing Steps Patient Completed: Chest, Left Arm, Abdomen, Right upper leg, Left upper leg Bathing: 1: Two helpers  FIM - Upper Body Dressing/Undressing Upper body dressing/undressing steps patient completed: Put head through opening of pull over shirt/dress, Thread/unthread right sleeve of pullover shirt/dresss Upper body dressing/undressing: 3: Mod-Patient completed 50-74% of tasks FIM - Lower Body Dressing/Undressing Lower body dressing/undressing steps patient completed: Thread/unthread right pants leg, Thread/unthread left pants leg, Pull pants up/down, Don/Doff right sock, Don/Doff left sock Lower body dressing/undressing: 1: Two helpers  FIM - Toileting Toileting: 0: Activity did not occur  FIM - Air cabin crew Transfers: 0-Activity did not occur  FIM - Financial planner Transfer: 1: Two helpers  FIM - Locomotion: Wheelchair Distance: 100 Locomotion: Wheelchair: 0: Activity did not occur FIM - Locomotion: Ambulation Locomotion: Ambulation: 0: Activity did not occur  Comprehension Comprehension Mode: Auditory Comprehension:  5-Understands basic 90% of the time/requires cueing < 10% of the time  Expression Expression Mode: Verbal Expression: 3-Expresses basic 50 - 74% of the time/requires cueing 25 - 50% of the time. Needs to repeat parts of sentences.  Social Interaction Social Interaction:  2-Interacts appropriately 25 - 49% of time - Needs frequent redirection.  Problem Solving Problem Solving: 2-Solves basic 25 - 49% of the time - needs direction more than half the time to initiate, plan or complete simple activities  Memory Memory: 4-Recognizes or recalls 75 - 89% of the time/requires cueing 10 - 24% of the time  Medical Problem List and Plan: 1. Functional deficits secondary to Right MCA infarct with left hemiparesis, dysarthria, dysphagia, and visual spatial deficits 2. DVT Prophylaxis/Anticoagulation: Pharmaceutical: Lovenox 3. New onset Headaches/Pain Management: Continue tylenol prn for now. Left ankle pain is gout  , start steroid burst and taper. Mood: Poor frustration tolerance noted. LCSW to follow for evaluation and support. Monitor CBG on prednisone 5. Neuropsych: This patient is not capable of making decisions on his own behalf. 6. Skin/Wound Care: Routine pressure relief measures.  7. Fluids/Electrolytes/Nutrition: Monitor I/O. Check follow up labs in am. Offer supplements as indicated if intake poor.  8. HTN: Will monitor every 8 hours. Currently controlled of medications (and to avoid hypotension). will resume medications as indicated. Continue     10 OSA: Continue CPAP (has home machine) whenever asleep. poor compliance, discussed increase CVA risk 11. Recent esophagitis/duodenitis due to gastric ulcer: Protonix bid to help promote healing 12. CAD: Has chest wall pain due to poorly Healed sternum. Continue ASA, plavix, lipitor--off imdur and coreg  13. ABLA: 11.6 hgb  14.  Coag neg staph in urine, no fever or dysuria, LOS (Days) 7 A FACE TO FACE EVALUATION WAS PERFORMED  KIRSTEINS,ANDREW E 02/22/2014, 7:57 AM

## 2014-02-22 NOTE — Plan of Care (Signed)
Problem: Consults Goal: RH STROKE PATIENT EDUCATION See Patient Education module for education specifics  Outcome: Progressing Goal: Diabetes Guidelines if Diabetic/Glucose > 140 If diabetic or lab glucose is > 140 mg/dl - Initiate Diabetes/Hyperglycemia Guidelines & Document Interventions  Outcome: Progressing  Problem: RH BOWEL ELIMINATION Goal: RH STG MANAGE BOWEL WITH ASSISTANCE STG Manage Bowel with Mod Assistance.  Outcome: Progressing Goal: RH STG MANAGE BOWEL W/MEDICATION W/ASSISTANCE STG Manage Bowel with Medication with Mod Assistance.  Outcome: Progressing Goal: RH STG MANAGE BOWEL W/EQUIPMENT W/ASSISTANCE STG Manage Bowel With Equipment With Mod Assistance  Outcome: Progressing  Problem: RH BLADDER ELIMINATION Goal: RH STG MANAGE BLADDER WITH ASSISTANCE STG Manage Bladder With Total Assistance  Outcome: Progressing Goal: RH STG MANAGE BLADDER WITH MEDICATION WITH ASSISTANCE STG Manage Bladder With Medication With Mod Assistance.  Outcome: Progressing Goal: RH STG MANAGE BLADDER WITH EQUIPMENT WITH ASSISTANCE STG Manage Bladder With Equipment With Mod Assistance  Outcome: Progressing  Problem: RH SKIN INTEGRITY Goal: RH STG SKIN FREE OF INFECTION/BREAKDOWN Min Assist  Outcome: Progressing

## 2014-02-22 NOTE — Progress Notes (Signed)
Occupational Therapy Session Note  Patient Details  Name: Gregory Wiley MRN: [id number] Date of Birth: Aug 15, 1950  Today's Date: 02/22/2014 OT Individual Time: 0905-1000 OT Individual Time Calculation (min): 55 min    Short Term Goals: Week 1:  OT Short Term Goal 1 (Week 1): Pt will roll to the right side in bed during LB selfcare with max assist and mod instructional cueing for sequencing. OT Short Term Goal 2 (Week 1): Pt's wife will return demonstrate safe assist with LUE PROM exercises. OT Short Term Goal 3 (Week 1): Pt will complete UB bathing sitting EOB unsupported and min assist.  OT Short Term Goal 4 (Week 1): Pt will perform UB dressing with min assist and mod instructional cueing.   OT Short Term Goal 5 (Week 1): Pt will perform toilet transfer sliding board or squat pivot with total assist +2 (pt 25%) to bariatric drop arm commode.   Skilled Therapeutic Interventions/Progress Updates:    Pt performed peri bathing and donning brief in supine rolling prior to sitting EOB.  Total assist +2 (pt 25%) for rolling to the right and mod assist for rolling to the left.  He continues to need mod questioning cues to sequence rolling to the right side.  He was able to assist with front peri washing but needed overall mod assist for thoroughness secondary to positioning in the bed.  [name] transitioned from sidelying to sitting with total assist +2 (pt 25 %).  He was able to maintain sitting during bathing with supervision for UB and min assist for LB.  Mod questioning cueing to look to the left for soap and washcloth as well as for remembering to wash his LUE and RUE.  Total hand over hand assist to use the LUE to wash the right arm.  He was able to donn a pullover shirt with min assist overall and even threaded his left arm without assistance using hemi technique.  Performed partial sit to stand to pull pants over hips but pt unable to stand completely with therapist and tech assisting.  Sliding  board to wheelchair with +2 assistance as well pt 25%.    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  Restrictions Weight Bearing Restrictions: No  Pain:  Pain in bilateral ankles see pain section for details ADL: See FIM for current functional status  Therapy/Group: Individual Therapy  Talli Kimmer OTR/L 02/22/2014, 1:21 PM

## 2014-02-22 NOTE — Progress Notes (Signed)
Per water protocol patients mouth was cleaned before and after drinking water. Patient was able to tolerate about 4 oz with no coughing or any other signs of aspiration.  Jimmie Molly, RN

## 2014-02-22 NOTE — Progress Notes (Signed)
Pt. Was placed on CPAP auto titrate (min: 4, max: 15) via nasal prongs. Pt. Is tolerating CPAP well at this time without any complications.

## 2014-02-22 NOTE — Progress Notes (Signed)
Speech Language Pathology Daily Session Note  Patient Details  Name: Gregory Wiley MRN: [id number] Date of Birth: 1950-08-19  Today's Date: 02/22/2014 SLP Individual Time: 1000-1100 SLP Individual Time Calculation (min): 60 min  Short Term Goals: Week 1: SLP Short Term Goal 1 (Week 1): Patien will consume Dys. 2 textures and nectar-thick liquids via straw with Mod cues for recall and use of safe swallow strategies to miimize overt s/s of aspiration SLP Short Term Goal 2 (Week 1): Patient will recall and scan to the left of midline to locate items as needed, during basic functional tasks with Mod mulitmodal cues  SLP Short Term Goal 3 (Week 1): Patient will produce intelligible speech at the phrase-sentence level of vebral expression with Min vebral cues to utilize speech intelligibilty strategies SLP Short Term Goal 4 (Week 1): Patient will label 2 physical and 2 cognitive deficits with Mod question cues SLP Short Term Goal 5 (Week 1): Patient will sustain attention to basic tasks for 1 minute with Mod verbal cues for redirections  Skilled Therapeutic Interventions: Skilled treatment session focused on addressing speech intelligibility and cognition goals.  Patient completed basic 4-step photo sequencing task with Max multimodal clinician cues for scanning left of midline and Min question cues for accuracy with sequence. SLP also facilitated session with Min verbal cues to increase vocal intensity at the phrase-sentence level during picture description.  Continue with current plan of care.     FIM:  Comprehension Comprehension Mode: Auditory Comprehension: 3-Understands basic 50 - 74% of the time/requires cueing 25 - 50%  of the time Expression Expression Mode: Verbal Expression: 3-Expresses basic 50 - 74% of the time/requires cueing 25 - 50% of the time. Needs to repeat parts of sentences. Social Interaction Social Interaction: 2-Interacts appropriately 25 - 49% of time - Needs frequent  redirection. Problem Solving Problem Solving: 2-Solves basic 25 - 49% of the time - needs direction more than half the time to initiate, plan or complete simple activities Memory Memory: 3-Recognizes or recalls 50 - 74% of the time/requires cueing 25 - 49% of the time  Pain Pain Assessment Pain Assessment: No/denies pain  Therapy/Group: Individual Therapy  Carmelia Roller., [place] [phone]  [place] 02/22/2014, 4:53 PM

## 2014-02-22 NOTE — Progress Notes (Signed)
Recreational Therapy Assessment and Plan  Patient Details  Name: Gregory Wiley MRN: [id number] Date of Birth: 29-Jan-1951 Today's Date: 02/22/2014  Rehab Potential: Good ELOS: 3 weeks   Assessment Clinical Impression: Problem List:  Patient Active Problem List   Diagnosis Date Noted  . Hemiplegia affecting left dominant side 02/16/2014  . 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 Benign neoplasm of colon 02/10/2013  . Special screening for malignant neoplasms, colon 02/10/2013  . OSA on CPAP 01/06/2013  . S/P angioplasty with DES to CFX 07/01/11 07/02/2011  . Sleep apnea, "cant afford C-pap" 07/02/2011  . CAD, RCA PCI '09, 10/11, CABG X 4 5/12 06/30/2011  . Hypertension, B/P has been low this admission 06/30/2011  . Hyperlipidemia 06/30/2011  . Morbid obesity 06/30/2011  . NSTEMI, 06/29/11 06/30/2011  . 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 Ischemic cardiomyopathy, EF 35-40 2D May 2012 06/30/2011  . CHF, acute, mild 06/30/2011    Past Medical History:  Past Medical History  Diagnosis Date  . Coronary artery disease     stent, then CABG 07/20/10  . MI (myocardial infarction)     NSTEMI- last cath 06/2011-Stent to LCX-DES, last nuc 07/30/11 low risk  . Diabetes mellitus   . Hypertension   . Hyperlipidemia   . Obesity (BMI 30-39.9)   . OSA on CPAP     since July 2013  . H/O cardiomyopathy     ischemic, now last echo 07/30/11, EF 38%W  . Diverticulosis   . Internal hemorrhoids   . Tubular adenoma of colon   . Plantar fasciitis of left foot   . Stroke    Past Surgical History:  Past Surgical History  Procedure Laterality Date  . Coronary artery bypass graft  07/20/2010     LIMA-LAD; VG-ACUTE MARG of RCA; Seq VG-distal RCA & then pda  . Coronary angioplasty with stent placement  07/01/2011    DES-Resolute to  native LCX  . Coronary angioplasty with stent placement  12/01/2007    COMPLEX 5 LESION PCI INCLUDING CUTTING BALLOON AND 4 CYPHER DESs  . Hernia repair  2006  . Knee surgery Right   . Hand surgery      to take glass out  . Colonoscopy N/A 02/10/2013    Procedure: COLONOSCOPY; Surgeon: Meryl Dare, MD; Location: [facility] ENDOSCOPY; Service: Endoscopy; Laterality: N/A;  . Retinal detachment surgery    . Loop recorder implant  02/15/2014    MDT LINQ implanted by Dr Johney Frame for cryptogenic stroke  . Tee without cardioversion N/A 02/15/2014    Procedure: TRANSESOPHAGEAL ECHOCARDIOGRAM (TEE); Surgeon: Wendall Stade, MD; Location: [facility] ENDOSCOPY; Service: Cardiovascular; Laterality: N/A;    Assessment & Plan Clinical Impression: Patient is a 63 y.o. year old male with recent admission to the hospital on11/28/15 with left sided weakness with unsteady gait/fall, left facial droop and slurred speech. Patient has been off ASA/plavix due to recent bleeding ulcer. Family reports non-compliant with medications as well as CPAP use. Patient with progressive weakness in ED. CT head negative. MRI brain revealed acute infarct right MCA territory involving the right temporal lobe, insula, and basal ganglia and abrupt occlusion right M1 segment question due to embolus. Patient transferred to CIR on 02/15/2014 .   Pt presents with decreased activity tolerance, decreased functional mobility, decreased balance, decreased coordination, left neglect, decreased attention, decreased awareness, decreased problem solving, decreased safety awareness, decreased memory and delayed processing Limiting pt's independence with leisure/community pursuits.   Leisure History/Participation  Premorbid leisure interest/current participation: Medical laboratory scientific officer - Building control surveyor - Doctor, hospital - Travel (Comment) Leisure Participation Style: With Family/Friends Awareness of  Community Resources: Good-identify 3 post discharge leisure resources Psychosocial / Spiritual Social interaction - Mood/Behavior: Cooperative Academic librarian Appropriate for Education?: Yes Recreational Therapy Orientation Orientation -Reviewed with patient: Available activity resources Strengths/Weaknesses Patient Strengths/Abilities: Willingness to participate;Active premorbidly Patient weaknesses: Physical limitations TR Patient demonstrates impairments in the following area(s): Edema;Endurance;Motor;Pain;Safety;Skin Integrity  Plan Rec Therapy Plan Is patient appropriate for Therapeutic Recreation?: Yes Rehab Potential: Good Treatment times per week: Min 1 time per week >20 minutes Estimated Length of Stay: 3 weeks TR Treatment/Interventions: Adaptive equipment instruction;1:1 session;Balance/vestibular training;Functional mobility training;Community reintegration;Cognitive remediation/compensation;Patient/family education;Therapeutic activities;Recreation/leisure participation;Therapeutic exercise;UE/LE Coordination activities;Wheelchair propulsion/positioning Recommendations for other services: Neuropsych  Recommendations for other services: Neuropsych  Discharge Criteria: Patient will be discharged from TR if patient refuses treatment 3 consecutive times without medical reason.  If treatment goals not met, if there is a change in medical status, if patient makes no progress towards goals or if patient is discharged from hospital.  The above assessment, treatment plan, treatment alternatives and goals were discussed and mutually agreed upon: by patient  [name] 02/22/2014, 4:28 PM

## 2014-02-23 ENCOUNTER — Inpatient Hospital Stay (HOSPITAL_COMMUNITY): Payer: 59 | Admitting: Speech Pathology

## 2014-02-23 ENCOUNTER — Encounter (HOSPITAL_COMMUNITY): Payer: 59

## 2014-02-23 ENCOUNTER — Inpatient Hospital Stay (HOSPITAL_COMMUNITY): Payer: 59 | Admitting: Occupational Therapy

## 2014-02-23 ENCOUNTER — Inpatient Hospital Stay (HOSPITAL_COMMUNITY): Payer: 59 | Admitting: *Deleted

## 2014-02-23 DIAGNOSIS — G4733 Obstructive sleep apnea (adult) (pediatric): Secondary | ICD-10-CM

## 2014-02-23 MED ORDER — TRAMADOL HCL 50 MG PO TABS
50.0000 mg | ORAL_TABLET | Freq: Four times a day (QID) | ORAL | Status: DC
  Administered 2014-02-23 – 2014-02-24 (×4): 50 mg via ORAL
  Filled 2014-02-23 (×4): qty 1

## 2014-02-23 MED ORDER — TAMSULOSIN HCL 0.4 MG PO CAPS
0.4000 mg | ORAL_CAPSULE | Freq: Every day | ORAL | Status: DC
  Administered 2014-02-23 – 2014-03-03 (×9): 0.4 mg via ORAL
  Filled 2014-02-23 (×10): qty 1

## 2014-02-23 NOTE — Progress Notes (Signed)
Occupational Therapy Session Note  Patient Details  Name: KORBEN CARCIONE MRN: [id number] Date of Birth: Mar 05, 1951  Today's Date: 02/23/2014 OT Individual Time: 0900-1000 OT Individual Time Calculation (min): 60 min    Short Term Goals: Week 1:  OT Short Term Goal 1 (Week 1): Pt will roll to the right side in bed during LB selfcare with max assist and mod instructional cueing for sequencing. OT Short Term Goal 2 (Week 1): Pt's wife will return demonstrate safe assist with LUE PROM exercises. OT Short Term Goal 3 (Week 1): Pt will complete UB bathing sitting EOB unsupported and min assist.  OT Short Term Goal 4 (Week 1): Pt will perform UB dressing with min assist and mod instructional cueing.   OT Short Term Goal 5 (Week 1): Pt will perform toilet transfer sliding board or squat pivot with total assist +2 (pt 25%) to bariatric drop arm commode.   Skilled Therapeutic Interventions/Progress Updates:    Pt performed peri bathing and donning brief in supine rolling prior to sitting EOB. Total assist +2 (pt 25%) for rolling to the right and mod assist for rolling to the left. He continues to need mod questioning cues to sequence rolling to the right side. He was able to assist with front peri washing but needed overall mod assist for thoroughness secondary to positioning in the bed. Mr. Wender transitioned from sidelying to sitting with total assist +2 (pt 25 %). He was able to maintain sitting during bathing with supervision for UB and min assist for LB. Mod questioning cueing to look to the left for soap and washcloth.  Increased organization with bathing as he washed his chest, left arm, right leg requiring max instructional cueing to come back and wash his face and the RUE.  Total hand over hand assist to use the LUE to wash the right arm. He was able to donn a pullover shirt with min assist overall.  He was not able to donn the left shirt sleeve this session without max assist from therapist  Performed sit to stand to pull pants over hips with total assist +2 (pt 25%). Sliding board to wheelchair with +2 assistance as well pt 25%.  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  Restrictions Weight Bearing Restrictions: No  Pain: Pain Assessment Pain Score: 6  ADL: See FIM for current functional status  Therapy/Group: Individual Therapy  Demetry Bendickson OTR/L 02/23/2014, 12:31 PM

## 2014-02-23 NOTE — Patient Care Conference (Signed)
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 Arthralgia of left ankle 02/24/2014  . Hemiplegia affecting left dominant side 02/16/2014  . 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 Benign neoplasm of colon 02/10/2013  . Special screening for malignant neoplasms, colon 02/10/2013  . OSA on CPAP 01/06/2013  . S/P angioplasty with DES to CFX 07/01/11 07/02/2011  . Sleep apnea, "cant afford C-pap" 07/02/2011  . CAD, RCA PCI '09, 10/11, CABG X 4 5/12 06/30/2011  . Hypertension, B/P has been low this admission 06/30/2011  . Hyperlipidemia 06/30/2011  . Morbid obesity 06/30/2011  . NSTEMI, 06/29/11 06/30/2011  . 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 Ischemic cardiomyopathy, EF 35-40 2D May 2012 06/30/2011  . CHF, acute, mild 06/30/2011    Expected Discharge Date:    Team Members Present: Physician leading conference: Dr. Alysia Penna Social Worker Present: Ovidio Kin, LCSW Nurse Present: Rayetta Pigg, RN PT Present: Cameron Sprang, PT;Callia Swim Jari Favre, PT OT Present: Benay Pillow, Maryella Shivers, OT SLP Present: Gunnar Fusi, SLP PPS Coordinator present : Daiva Nakayama, RN, CRRN     Current Status/Progress Goal Weekly Team Focus  Medical   left ankle pain xray showed moderate DJD  discharge to SNF with 1+ assist level  pain  management   Bowel/Bladder   Patient is incontinent of bowel and bladder at times. PVR's in place. I&O cath in place PRN. Cath needed at times. Pt has urge to void with cath  needed at times.   Manage bowel and bladder with moderate assistance.   Timed tolieting q 2-3 hours during day. Continue PVR's and I&O cath PRN.   Swallow/Nutrition/ Hydration   Dys.2 textures and nectar-thick liquids with Mod assist and Free Water Protocol  least restrictive PO intake with Min assist      ADL's   Total +2 for all LB bathing and dressing supine to sit to partial stand.  Pt still with increased pain in both ankles with the left still being the worst.  Trace shoulder movements in the LUE but not hand AROM noted.  Total +2 for partial standing as well as for sliding  board transfers.  He still demonstrates left neglect but does scan across midline to the left better than last week.   Max assist for transfers, toileting, and LB selfcare sit to stand.  Min assist to supervision for UB selfcare  selfcare re-training, transfer training, positioning education, balance re-education, pt/family education, visual compensation and treatment.   Mobility   Pain dominant-limited by B ankle (especially L), shoulder, neck pain, +2 assist for bed mobility and slide board transfers, unable to work on w/c mobility in TIS w/c, +2 assist for sit <> stand with Clarise Cruz Plus  max A x 1 except S for w/c mobility  W/c seating and positioning, pain management, L NMR, sit <> stand transfers, attending  to L, SB transfers, pt/family education   Communication   Mod assist   Min assist   increase vocal intensity and overall awareness   Safety/Cognition/ Behavioral Observations  Mod-MAx assist   Min assist   increase left attention, sustained attention and overall basic problem solving ability   Pain   pain noted to feet due to gout. Tylenol PRN   </=3  assess and medicate as needed   Skin   left chest loop recorder in place.   No new skin  breakdown with max assistance from staff.   Assess skin q shift. Turn q 2 hour while in bed.       *See Care Plan and progress notes for long and short-term goals.  Barriers to Discharge: heavy physical assist , pain    Possible Resolutions to Barriers:  Cont rehab see above    Discharge Planning/Teaching Needs:  Plan is for NHP once ready to leave rehab-wife here daily and observing in therapies.  Neuro-psych to see today for coping      Team Discussion:  Started on flomax for urinary retension.  Neuro-psych to see today for coping. Ankle pain is limiting him in therapies. X-ray showed arthritis-no gout. Start on pain scheduled pain meds. Po intake poor-dietician to see. Water protocol begun  Revisions to Treatment Plan:  NHP   Continued Need for Acute Rehabilitation Level of Care: The patient requires daily medical management by a physician with specialized training in physical medicine and rehabilitation for the following conditions: Daily direction of a multidisciplinary physical rehabilitation program to ensure safe treatment while eliciting the highest outcome that is of practical value to the patient.: Yes Daily medical management of patient stability for increased activity during participation in an intensive rehabilitation regime.: Yes Daily analysis of laboratory values and/or radiology reports with any subsequent need for medication adjustment of medical intervention for : Neurological problems;Other  Tawnya Pujol, [place] Rhyme 02/24/2014, 11:11 AM

## 2014-02-23 NOTE — Progress Notes (Signed)
Recreational Therapy Session Note  Patient Details  Name: Gregory Wiley MRN: [id number] Date of Birth: 10-Sep-1950 Today's Date: 02/23/2014  Pain: no c/o Skilled Therapeutic Interventions/Progress Updates: Session focused on activity tolerance, dynamic sitting balance while reaching outside BOS to the left WBing through LUE & scanning to the left.  Pt sat EOC reaching outside BOS to the left with hand over hand assist, mod cues for horseshoe task.  Pt's wife present, observing, &cuing pt throughout session. Therapy/Group: Co-Treatment  Astra Gregg 02/23/2014, 4:07 PM

## 2014-02-23 NOTE — Progress Notes (Signed)
Social Work Patient ID: Gregory Wiley, male   DOB: 04/11/1950, 63 y.o.   MRN: [id number] Met with pt and wife to discuss team conference progress and length of stay.  Wife wanted to know if he was approved for 7 more days, will not know this until Thursday or Friday.  Pt does have medical issues MD is working on.  Wife is here daily and provides emotional support.  Wife wants to do pt's advanced directives While here,gave her the forms.  She reported pt stayed up all day today.  Will work on discharge plans and touch base with [employer] regarding length of stay knowing pt is for NHP. 

## 2014-02-23 NOTE — Progress Notes (Signed)
Physical Therapy Session Note  Patient Details  Name: Gregory Wiley MRN: [id number] Date of Birth: 1951-02-28  Today's Date: 02/23/2014 PT Co-Treatment Time: 1430-1500 PT Co-Treatment Time Calculation (min): 30 min  Short Term Goals: Week 1:  PT Short Term Goal 1 (Week 1): Pt will perform rolling to L with min A and to R with total A x 1.  PT Short Term Goal 2 (Week 1): Pt will perform sit <> supine with total A x 1.  PT Short Term Goal 3 (Week 1): Pt will perform bed <> w/c transfer with +2 assist.  PT Short Term Goal 4 (Week 1): Pt will maintain dynamic sitting balance x 3 min with supervision.  PT Short Term Goal 5 (Week 1): Pt will propel w/c x 50 ft with min A.  Skilled Therapeutic Interventions/Progress Updates:   Skilled co-treatment with SLP to address cognition and attending to L during dynamic sitting balance, transfers, and LLE weightbearing. Pt performed w/c <> mat table transfers via Clarise Cruz Plus (no sling and foot plate in place) with +3 assist (1 person guiding lift and therapist on each side). Pt performed dynamic sitting balance with reaching outside BOS and to L using RUE during left attention task at supervision level. Pt attempted reaching forward with +2 assist to facilitate anterior weight shift with hip clearance from raised mat, increased L ankle pain in WB. Pt agreeable to attempting complete stand as initial ankle dorsiflexion is most painful part of movement. Patient transferred sit <> stand with +2 assist and able to remain standing x 10-15 sec. Worked on seating and positioning in wheelchair, lumbar back support adjusted to higher position to decrease posterior pelvic tilt. Patient and wife educated on and provided handout for positioning patient's L hemiplegic side in bed and handout posted above bed for patient to refer nursing staff to use if patient in pain or discomfort. Pt left sitting in w/c with wife present.  Therapy Documentation Precautions:   Precautions Precautions: Fall Precaution Comments: L neglect, L hemiplegia, morbid obesity, gout in both ankles with the left being the worst  Restrictions Weight Bearing Restrictions: No Pain: Pain Assessment Pain Assessment: Faces Faces Pain Scale: Hurts even more Pain Type: Acute pain Pain Location: Ankle Pain Orientation: Left Pain Onset: With Activity Pain Intervention(s): Repositioned;Rest  See FIM for current functional status  Therapy/Group: Co-Treatment with SLP  Carney Living A 02/23/2014, 3:15 PM

## 2014-02-23 NOTE — Progress Notes (Signed)
Speech Language Pathology Daily Session Note  Patient Details  Name: Gregory Wiley MRN: [id number] Date of Birth: [birth year]  Today's Date: 02/23/2014 SLP Co-Treatment Time: 1400 (1400-1500 co-tx with PT)-1430 SLP Co-Treatment Time Calculation (min): 30 min  Short Term Goals: Week 1: SLP Short Term Goal 1 (Week 1): Patien will consume Dys. 2 textures and nectar-thick liquids via straw with Mod cues for recall and use of safe swallow strategies to miimize overt s/s of aspiration SLP Short Term Goal 2 (Week 1): Patient will recall and scan to the left of midline to locate items as needed, during basic functional tasks with Mod mulitmodal cues  SLP Short Term Goal 3 (Week 1): Patient will produce intelligible speech at the phrase-sentence level of vebral expression with Min vebral cues to utilize speech intelligibilty strategies SLP Short Term Goal 4 (Week 1): Patient will label 2 physical and 2 cognitive deficits with Mod question cues SLP Short Term Goal 5 (Week 1): Patient will sustain attention to basic tasks for 1 minute with Mod verbal cues for redirections  Skilled Therapeutic Interventions: Skilled co-treatment session focused on addressing cognition goals.  PT present to address dynamic sitting balance during a structured left attention task, which facilitated increase arousal and sustained attention for 2-3 minute increments with Mod verbal cues; patient also required Max cues to san to the left and locate items needed to complete task.  SLP also facilitated session with Mod verbal cues to produce intelligible speech at the phrase-sentence level with Mod cues to utilize speech intelligibility compensatory strategies.  Continue with current plan of care.   FIM:  Comprehension Comprehension Mode: Auditory Comprehension: 3-Understands basic 50 - 74% of the time/requires cueing 25 - 50%  of the time Expression Expression Mode: Verbal Expression: 3-Expresses basic 50 - 74% of the  time/requires cueing 25 - 50% of the time. Needs to repeat parts of sentences. Social Interaction Social Interaction: 2-Interacts appropriately 25 - 49% of time - Needs frequent redirection. Problem Solving Problem Solving: 2-Solves basic 25 - 49% of the time - needs direction more than half the time to initiate, plan or complete simple activities Memory Memory: 3-Recognizes or recalls 50 - 74% of the time/requires cueing 25 - 49% of the time FIM - Eating Eating Activity: 5: Needs verbal cues/supervision;4: Help with picking up utensils  Pain Pain Assessment Pain Assessment: Faces Pain Score: 2  Faces Pain Scale: Hurts even more Pain Type: Acute pain Pain Location: Ankle Pain Orientation: Left Pain Onset: With Activity Pain Intervention(s): Repositioned;Rest  Therapy/Group: Individual Therapy  Carmelia Roller., [place] [phone]  [place] 02/23/2014, 3:29 PM

## 2014-02-23 NOTE — Progress Notes (Signed)
Occupational Therapy Session Note  Patient Details  Name: Gregory Wiley MRN: [id number] Date of Birth: Sep 24, 1950  Today's Date: 02/23/2014 OT Individual Time: 1254 (5 min of make up time)-1359 OT Individual Time Calculation (min): 65 min    Short Term Goals: Week 1:  OT Short Term Goal 1 (Week 1): Pt will roll to the right side in bed during LB selfcare with max assist and mod instructional cueing for sequencing. OT Short Term Goal 2 (Week 1): Pt's wife will return demonstrate safe assist with LUE PROM exercises. OT Short Term Goal 3 (Week 1): Pt will complete UB bathing sitting EOB unsupported and min assist.  OT Short Term Goal 4 (Week 1): Pt will perform UB dressing with min assist and mod instructional cueing.   OT Short Term Goal 5 (Week 1): Pt will perform toilet transfer sliding board or squat pivot with total assist +2 (pt 25%) to bariatric drop arm commode.   Skilled Therapeutic Interventions/Progress Updates:  Upon entering room, pt seated in wheelchair with wife present for education. Pt with 2/10 c/o pain in L ankle during session when resting feet firmly on ground for dynamic sitting balance task. OT session with focus on pt and family education for PROM exercises, dynamic sitting balance, L inattention, forward leaning, and weight bearing through L UE. OT educated and demonstrated PROM for L UE for shoulder,elbow,wrist, and digits in all planes. His wife demonstrated understanding by performing with min verbal cues for proper technique and x 5 reps each exercises. Paper handout with exercises provided for wife as well. Pt seated in wheelchair with L UE places on therapist knee to weight bear while reaching with R UE to the L to obtain horse shoe to throw. Pt required to engage in forward leaning prior to throwing items. Pt reaching towards ground to pick up horse shoe and place back to L side on table with R UE. OT rearranged bed with RN giving permission in order to increase looking  to the L when not in therapy based on placement while still making room safe for pt and staff. Pt seated in wheelchair awaiting next therapist with wife present in room upon exiting.   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  Restrictions Weight Bearing Restrictions: No Pain: Pain Assessment Pain Assessment: Faces Pain Score: 2  Faces Pain Scale: Hurts even more Pain Type: Acute pain Pain Location: Ankle Pain Orientation: Left Pain Onset: With Activity Pain Intervention(s): Repositioned;Rest  See FIM for current functional status  Therapy/Group: Individual Therapy  Gregory Wiley 02/23/2014, 4:48 PM

## 2014-02-23 NOTE — Progress Notes (Signed)
Pt unable to void with two attempts. Bladder scanned 59cc with pt having strong urge to void. Pt wanted to be I&O cathed with a result of 500cc amber malodorous urine. Will cont. To monitor

## 2014-02-23 NOTE — Progress Notes (Signed)
Orthopedic Tech Progress Note Patient Details:  Gregory Wiley 1950/04/14 [id number]  Ortho Devices Type of Ortho Device: Ankle Air splint Ortho Device/Splint Location: lle Ortho Device/Splint Interventions: Application   [name] 02/23/2014, 12:15 PM

## 2014-02-23 NOTE — Progress Notes (Signed)
Subjective/Complaints:  Slept poorly trying to use CPAP but ? How many hrs , no abd pain, nausea or vomiting Review of Systems - Negative except above    Poor awareness of deficitssecondary to R MCA lg infarct Objective: Vital Signs: Blood pressure 138/67, pulse 82, temperature 97.9 F (36.6 C), temperature source Oral, resp. rate 16, height $RemoveBe'6\' 1"'gAylVtpwp$  (1.854 m), weight 157.398 kg (347 lb), SpO2 98 %. No results found. Results for orders placed or performed during the hospital encounter of 02/15/14 (from the past 72 hour(s))  Glucose, capillary     Status: Abnormal   Collection Time: 02/20/14 11:36 AM  Result Value Ref Range   Glucose-Capillary 104 (H) 70 - 99 mg/dL  Glucose, capillary     Status: Abnormal   Collection Time: 02/20/14  4:22 PM  Result Value Ref Range   Glucose-Capillary 106 (H) 70 - 99 mg/dL  Glucose, capillary     Status: None   Collection Time: 02/20/14  8:49 PM  Result Value Ref Range   Glucose-Capillary 97 70 - 99 mg/dL   Comment 1 Notify RN   Glucose, capillary     Status: Abnormal   Collection Time: 02/21/14  5:03 PM  Result Value Ref Range   Glucose-Capillary 112 (H) 70 - 99 mg/dL  Glucose, capillary     Status: Abnormal   Collection Time: 02/21/14  9:22 PM  Result Value Ref Range   Glucose-Capillary 124 (H) 70 - 99 mg/dL     HEENT: normal Cardio: RRR and no murmur Resp: CTA B/L and unlabored GI: BS positive and NT, ND Extremity:  Pulses positive and No Edema Skin:   Intact Neuro: Alert/Oriented, Cranial Nerve Abnormalities Left central 7, Normal Sensory, Abnormal Motor 0/5 LUE and LLE persistent. Does sense pain. , Dysarthric and left Inattention, right gaze preference better MAS 2-3 Left elbow flexors  Musc/Skel:  Other no pain with Left shoulder ROM Gen NAD   Assessment/Plan: 1. Functional deficits secondary to Right MCA infarct with Left flaccid hemiparesis, left neglect, dysphagia, dysarthria which require 3+ hours per day of interdisciplinary  therapy in a comprehensive inpatient rehab setting. Physiatrist is providing close team supervision and 24 hour management of active medical problems listed below. Physiatrist and rehab team continue to assess barriers to discharge/monitor patient progress toward functional and medical goals. Team conference today please see physician documentation under team conference tab, met with team face-to-face to discuss problems,progress, and goals. Formulized individual treatment plan based on medical history, underlying problem and comorbidities. FIM: FIM - Bathing Bathing Steps Patient Completed: Chest, Left Arm, Abdomen, Right upper leg, Left upper leg, Right lower leg (including foot), Left lower leg (including foot) Bathing: 1: Two helpers  FIM - Upper Body Dressing/Undressing Upper body dressing/undressing steps patient completed: Thread/unthread right sleeve of pullover shirt/dresss, Thread/unthread left sleeve of pullover shirt/dress, Put head through opening of pull over shirt/dress, Pull shirt over trunk Upper body dressing/undressing: 4: Min-Patient completed 75 plus % of tasks FIM - Lower Body Dressing/Undressing Lower body dressing/undressing steps patient completed: Thread/unthread right pants leg, Thread/unthread left pants leg, Pull pants up/down, Don/Doff right sock, Don/Doff left sock Lower body dressing/undressing: 1: Two helpers  FIM - Toileting Toileting: 0: Activity did not occur  FIM - Air cabin crew Transfers: 0-Activity did not occur  FIM - Control and instrumentation engineer Devices: Sliding board Bed/Chair Transfer: 1: Two helpers  FIM - Locomotion: Wheelchair Distance: 100 Locomotion: Wheelchair: 1: Total Assistance/staff pushes wheelchair (Pt<25%) FIM - Locomotion: Ambulation  Locomotion: Ambulation: 0: Activity did not occur  Comprehension Comprehension Mode: Auditory Comprehension: 5-Understands basic 90% of the time/requires cueing < 10%  of the time  Expression Expression Mode: Verbal Expression: 3-Expresses basic 50 - 74% of the time/requires cueing 25 - 50% of the time. Needs to repeat parts of sentences.  Social Interaction Social Interaction: 2-Interacts appropriately 25 - 49% of time - Needs frequent redirection.  Problem Solving Problem Solving: 2-Solves basic 25 - 49% of the time - needs direction more than half the time to initiate, plan or complete simple activities  Memory Memory: 4-Recognizes or recalls 75 - 89% of the time/requires cueing 10 - 24% of the time  Medical Problem List and Plan: 1. Functional deficits secondary to Right MCA infarct with left hemiparesis, dysarthria, dysphagia, and visual spatial deficits 2. DVT Prophylaxis/Anticoagulation: Pharmaceutical: Lovenox 3. New onset Headaches/Pain Management: Continue tylenol prn for now. Left ankle pain is gout  , start steroid burst and taper. Mood: Poor frustration tolerance noted. LCSW to follow for evaluation and support. Monitor CBG on prednisone 5. Neuropsych: This patient is not capable of making decisions on his own behalf. 6. Skin/Wound Care: Routine pressure relief measures.  7. Fluids/Electrolytes/Nutrition: Monitor I/O. Check follow up labs in am. Offer supplements as indicated if intake poor.  8. HTN: Will monitor every 8 hours. Currently controlled of medications (and to avoid hypotension). will resume medications as indicated. Continue     10 OSA: Continue CPAP (has home machine) whenever asleep. poor compliance, discussed increase CVA risk, ? desat at noc 11. Recent esophagitis/duodenitis due to gastric ulcer: Protonix bid to help promote healing 12. CAD: Has chest wall pain due to poorly Healed sternum. Continue ASA, plavix, lipitor--off imdur and coreg  13. ABLA: 11.6 hgb  14.  Coag neg staph in urine, no fever or dysuria, LOS (Days) 8 A FACE TO FACE EVALUATION WAS PERFORMED  KIRSTEINS,ANDREW E 02/23/2014, 8:25 AM

## 2014-02-23 NOTE — Progress Notes (Signed)
Patient placed self on Home CPAP.

## 2014-02-24 ENCOUNTER — Encounter (HOSPITAL_COMMUNITY): Payer: 59 | Admitting: *Deleted

## 2014-02-24 ENCOUNTER — Encounter (HOSPITAL_COMMUNITY): Payer: Self-pay | Admitting: *Deleted

## 2014-02-24 ENCOUNTER — Inpatient Hospital Stay (HOSPITAL_COMMUNITY): Payer: 59 | Admitting: Speech Pathology

## 2014-02-24 ENCOUNTER — Inpatient Hospital Stay (HOSPITAL_COMMUNITY): Payer: 59 | Admitting: Physical Therapy

## 2014-02-24 DIAGNOSIS — M19072 Primary osteoarthritis, left ankle and foot: Secondary | ICD-10-CM

## 2014-02-24 DIAGNOSIS — M25572 Pain in left ankle and joints of left foot: Secondary | ICD-10-CM | POA: Diagnosis present

## 2014-02-24 MED ORDER — TRAMADOL HCL 50 MG PO TABS
50.0000 mg | ORAL_TABLET | Freq: Four times a day (QID) | ORAL | Status: DC | PRN
  Administered 2014-02-24 – 2014-03-02 (×2): 50 mg via ORAL
  Filled 2014-02-24 (×2): qty 1

## 2014-02-24 NOTE — Progress Notes (Signed)
Speech Language Pathology Daily Session Note  Patient Details  Name: Gregory Wiley MRN: [id number] Date of Birth: 1950-11-17  Today's Date: 02/24/2014 SLP Individual Time: 0800-0900 SLP Individual Time Calculation (min): 60 min  Short Term Goals: Week 1: SLP Short Term Goal 1 (Week 1): Patien will consume Dys. 2 textures and nectar-thick liquids via straw with Mod cues for recall and use of safe swallow strategies to miimize overt s/s of aspiration SLP Short Term Goal 2 (Week 1): Patient will recall and scan to the left of midline to locate items as needed, during basic functional tasks with Mod mulitmodal cues  SLP Short Term Goal 3 (Week 1): Patient will produce intelligible speech at the phrase-sentence level of vebral expression with Min vebral cues to utilize speech intelligibilty strategies SLP Short Term Goal 4 (Week 1): Patient will label 2 physical and 2 cognitive deficits with Mod question cues SLP Short Term Goal 5 (Week 1): Patient will sustain attention to basic tasks for 1 minute with Mod verbal cues for redirections  Skilled Therapeutic Interventions: Skilled treatment session focused on dysphagia and cognitive goals. Upon arrival, patient was lethargic while sitting upright in the wheelchair but was agreeable to participate in treatment session.  SLP facilitated session by providing Min A question cues for recall of current swallowing compensatory strategies and for utilization of strategies throughout the meal. Patient consumed breakfast meal of Dys. 2 textures with nectar-thick liquids via straw without overt s/s of aspiration but required Max verbal cues for PO intake and to attend to bolus in the oral cavity due to fatigue. RN made aware and reported that the patient had recently received pain medication which appeared to exacerbate his fatigue.  Oral care was performed at end of meal by the patient with supervision verbal cues needed for problem solving and by clinician for  thoroughness. At end of session, patient reported discomfort while sitting upright in the wheelchair and was reclined back for pressure relief which caused dizziness per patient report. RN made aware. Patient handed off to OT. Continue with current plan of care.    FIM:  Comprehension Comprehension Mode: Auditory Comprehension: 3-Understands basic 50 - 74% of the time/requires cueing 25 - 50%  of the time Expression Expression Mode: Verbal Expression: 3-Expresses basic 50 - 74% of the time/requires cueing 25 - 50% of the time. Needs to repeat parts of sentences. Social Interaction Social Interaction: 2-Interacts appropriately 25 - 49% of time - Needs frequent redirection. Problem Solving Problem Solving: 2-Solves basic 25 - 49% of the time - needs direction more than half the time to initiate, plan or complete simple activities Memory Memory: 3-Recognizes or recalls 50 - 74% of the time/requires cueing 25 - 49% of the time FIM - Eating Eating Activity: 5: Supervision/cues;5: Set-up assist for open containers  Pain Pain Assessment Pain Assessment: 0-10 Pain Score: 4  Pain Type: Acute pain Pain Location: Shoulder Pain Orientation: Left Pain Descriptors / Indicators: Aching Pain Onset: Gradual Pain Intervention(s): RN made aware;Repositioned  Therapy/Group: Individual Therapy  Jilda Kress 02/24/2014, 11:19 AM  [place], [place], [place]

## 2014-02-24 NOTE — Progress Notes (Signed)
Subjective/Complaints: Had some tremor last noc, no pain interfering with sleep Review of Systems - Negative except above    Poor awareness of deficitssecondary to R MCA lg infarct Objective: Vital Signs: Blood pressure 124/76, pulse 60, temperature 97.8 F (36.6 C), temperature source Oral, resp. rate 17, height 6\' 1"  (1.854 m), weight 157.398 kg (347 lb), SpO2 96 %. No results found. Results for orders placed or performed during the hospital encounter of 02/15/14 (from the past 72 hour(s))  Glucose, capillary     Status: Abnormal   Collection Time: 02/21/14  5:03 PM  Result Value Ref Range   Glucose-Capillary 112 (H) 70 - 99 mg/dL  Glucose, capillary     Status: Abnormal   Collection Time: 02/21/14  9:22 PM  Result Value Ref Range   Glucose-Capillary 124 (H) 70 - 99 mg/dL     HEENT: normal Cardio: RRR and no murmur Resp: CTA B/L and unlabored GI: BS positive and NT, ND Extremity:  Pulses positive and No Edema Skin:   Intact Neuro: Alert/Oriented, Cranial Nerve Abnormalities Left central 7, Normal Sensory, Abnormal Motor 0/5 LUE and LLE persistent. Does sense pain. , Dysarthric and left Inattention, right gaze preference better MAS 2-3 Left elbow flexors  Musc/Skel:  Other no pain with Left shoulder ROM MSK- Occ pain with ankle DF/PF Gen NAD   Assessment/Plan: 1. 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 Physiatrist is providing close team supervision and 24 hour management of active medical problems listed below. Physiatrist and rehab team continue to assess barriers to discharge/monitor patient progress toward functional and medical goals.  FIM: FIM - Bathing Bathing Steps Patient Completed: Chest, Left Arm, Abdomen, Right upper leg, Left upper leg, Right lower leg (including foot), Left lower  leg (including foot) Bathing: 1: Two helpers  FIM - Upper Body Dressing/Undressing Upper body dressing/undressing steps patient completed: Thread/unthread right sleeve of pullover shirt/dresss, Thread/unthread left sleeve of pullover shirt/dress, Put head through opening of pull over shirt/dress, Pull shirt over trunk Upper body dressing/undressing: 4: Min-Patient completed 75 plus % of tasks FIM - Lower Body Dressing/Undressing Lower body dressing/undressing steps patient completed: Thread/unthread right pants leg, Thread/unthread left pants leg, Pull pants up/down, Don/Doff right sock, Don/Doff left sock Lower body dressing/undressing: 1: Two helpers  FIM - Toileting Toileting: 0: Activity did not occur  FIM - Air cabin crew Transfers: 0-Activity did not occur  FIM - Control and instrumentation engineer Devices: Sliding board Bed/Chair Transfer: 1: Mechanical lift, 1: Two helpers  FIM - Locomotion: Wheelchair Distance: 100 Locomotion: Wheelchair: 1: Total Assistance/staff pushes wheelchair (Pt<25%) FIM - Locomotion: Ambulation Locomotion: Ambulation: 0: Activity did not occur  Comprehension Comprehension Mode: Auditory Comprehension: 3-Understands basic 50 - 74% of the time/requires cueing 25 - 50%  of the time  Expression Expression Mode: Verbal Expression: 3-Expresses basic 50 - 74% of the time/requires cueing 25 - 50% of the time. Needs to repeat parts of sentences.  Social Interaction Social Interaction: 2-Interacts appropriately 25 - 49% of time - Needs frequent redirection.  Problem Solving Problem Solving: 2-Solves basic 25 - 49% of the time - needs direction more than half the time to initiate, plan or complete simple activities  Memory Memory: 3-Recognizes or recalls 50 - 74% of the time/requires cueing 25 - 49% of the time  Medical Problem List and Plan: 1. Functional deficits secondary  to Right MCA infarct with left hemiparesis, dysarthria,  dysphagia, and visual spatial deficits 2. DVT Prophylaxis/Anticoagulation: Pharmaceutical: Lovenox 3. New onset Headaches/Pain Management: Continue tylenol prn for now. Left ankle pain is gout  , start steroid burst and taper. Mood: Poor frustration tolerance noted. LCSW to follow for evaluation and support. Monitor CBG on prednisone 5. Neuropsych: This patient is not capable of making decisions on his own behalf. 6. Skin/Wound Care: Routine pressure relief measures.  7. Fluids/Electrolytes/Nutrition: Monitor I/O. Check follow up labs in am. Offer supplements as indicated if intake poor.  8. HTN: Will monitor every 8 hours. Currently controlled of medications (and to avoid hypotension). will resume medications as indicated. Continue     10 OSA: Continue CPAP (has home machine) whenever asleep. poor compliance, discussed increase CVA risk, ? desat at noc 11. Recent esophagitis/duodenitis due to gastric ulcer: Protonix bid to help promote healing 12. CAD: Has chest wall pain due to poorly Healed sternum. Continue ASA, plavix, lipitor--off imdur and coreg  13.  Left ankle pain with ROM and wt bearing, non progressive, exam with minimal swelling, no erythema, not consistent, xray with mod OA, tramadol 14.  Coag neg staph in urine, no fever or dysuria, LOS (Days) 9 A FACE TO FACE EVALUATION WAS PERFORMED  KIRSTEINS,ANDREW E 02/24/2014, 7:09 AM

## 2014-02-24 NOTE — Progress Notes (Signed)
Patient has urge to void, wasble to void 200cc in urinal. Fawna Cranmer, RN

## 2014-02-24 NOTE — Progress Notes (Signed)
Occupational Therapy Session Note  Patient Details  Name: Gregory Wiley MRN: [id number] Date of Birth: 1950-06-29  Today's Date: 02/24/2014 OT Individual Time: 0900-1000 OT Individual Time Calculation (min): 60 min    Short Term Goals: Week 1:  OT Short Term Goal 1 (Week 1): Pt will roll to the right side in bed during LB selfcare with max assist and mod instructional cueing for sequencing. OT Short Term Goal 2 (Week 1): Pt's wife will return demonstrate safe assist with LUE PROM exercises. OT Short Term Goal 3 (Week 1): Pt will complete UB bathing sitting EOB unsupported and min assist.  OT Short Term Goal 4 (Week 1): Pt will perform UB dressing with min assist and mod instructional cueing.   OT Short Term Goal 5 (Week 1): Pt will perform toilet transfer sliding board or squat pivot with total assist +2 (pt 25%) to bariatric drop arm commode.   Skilled Therapeutic Interventions/Progress Updates:    Pt sitting in w/c upon arrival and transferred to bed with mechanical lift to perform LB bathing and dressing.  Pt rolled to right and left to facilitate bathing of buttocks and pulling up pants.  Pt rolls to left with min A and to right with mod A and mod verbal cues for sequencing.  Pt was able to bridge right buttocks to facilitate pulling pants.  Pt sat EOB to complete UB bathing and dressing.  Pt required max A to perform supine->sit EOB but was able to maintain sitting balance with steady A.  Pt required min A for UB bathing and dressing tasks.  Pt required tot A + 2 to perform sit->supine in bed (at patient's request).  Pt positioned in bed and remained in bed at end of session.  Focus on activity tolerance, bed mobility, supine<>sit EOB, attention to left, sitting balance, and safety awareness.  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   Restrictions Weight Bearing Restrictions: No Pain: Pain Assessment Pain Assessment: 0-10 Pain Score: 4  Pain Type: Acute pain Pain Location: Shoulder Pain Orientation: Left Pain Descriptors / Indicators: Aching Pain Onset: Gradual Pain Intervention(s): RN made aware;Repositioned  See FIM for current functional status  Therapy/Group: Individual Therapy  Leroy Libman 02/24/2014, 10:07 AM

## 2014-02-24 NOTE — Progress Notes (Signed)
Occupational Therapy Session Note  Patient Details  Name: REEF ACHTERBERG MRN: [id number] Date of Birth: 1951/03/16  Today's Date: 02/24/2014 OT Co-Treatment Time: 1330-1400  OT Co-Treatment Time Calculation (min): 30 min  Total Co treatment time from 1300 - 1400 Short Term Goals: Week 2:  OT Short Term Goal 1 (Week 2): Pt will perform toilet transfer sliding board or squat pivot with total assist +2 (pt 25%) to bariatric drop arm commode.  OT Short Term Goal 2 (Week 2): Pt will roll to the right side in bed during LB selfcare with max assist and mod instructional cueing for sequencing. OT Short Term Goal 3 (Week 2): Pt will perform UB dressing with min assist and mod instructional cueing.   OT Short Term Goal 4 (Week 2): Pt will be able to perform LB bathing sit to stand with total assist +2 (pt 30%).   OT Short Term Goal 5 (Week 2): Pt will locate bathing and grooming items left of midline with no more than mod questioning cues. .  Skilled Therapeutic Interventions/Progress Updates:  Pt seen for skilled co treatment with PT. Upon entering the room, pt in bed slightly reclined with reports of soreness in L UE. Pt states, "Ultram is killing me." Wife present throughout session. Pt performed supine >sit with total A and HOB elevated by patient. Slide board transfer >wheelchair positioned on the R with Max A of 2 with verbal cues for weight shifting and hand placement for safety.Therapist propelled pt to therapy gym and Clarise Cruz Plus utilized for transfer wheelchair <> mat with assist of 2 for safety. STS from raised mat height x 2 reps with +2 assist with L LE stabilized. Pt also attempting with bariatric RW placed in front and facilitation of +2 for standing and stand successful for ~ 30 seconds. Attempted reaching activity in standing with pt reporting increased pain and nausea. BP taken in seated position with results being 138/78. Cold compress applied to neck for comfort. Pt engaged in dynamic  sitting balance activity of reaching to L with R hand to obtain clothes pins in order to facilitate attending to L environment and weight shifting. During this task, L UE placed in position for weight bearing while reaching. Clarise Cruz Plus utilize to transfer pt back into chair as previously reported and assisted back to room.  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  Restrictions Weight Bearing Restrictions: No Pain: Pain Assessment Pain Score: 2   See FIM for current functional status  Therapy/Group: Co-Treatment  [place], Dayane Hillenburg L 02/24/2014, 4:18 PM

## 2014-02-24 NOTE — Progress Notes (Signed)
Speech Language Pathology Weekly Progress Note  Patient Details  Name: Gregory Wiley MRN: [id number] Date of Birth: [birth year]  Beginning of progress report period: February 16, 2014 End of progress report period: February 24, 2014   Short Term Goals: Week 1: SLP Short Term Goal 1 (Week 1): Patien will consume Dys. 2 textures and nectar-thick liquids via straw with Mod cues for recall and use of safe swallow strategies to miimize overt s/s of aspiration SLP Short Term Goal 1 - Progress (Week 1): Progressing toward goal SLP Short Term Goal 2 (Week 1): Patient will recall and scan to the left of midline to locate items as needed, during basic functional tasks with Mod mulitmodal cues  SLP Short Term Goal 2 - Progress (Week 1): Progressing toward goal SLP Short Term Goal 3 (Week 1): Patient will produce intelligible speech at the phrase-sentence level of vebral expression with Min vebral cues to utilize speech intelligibilty strategies SLP Short Term Goal 3 - Progress (Week 1): Progressing toward goal SLP Short Term Goal 4 (Week 1): Patient will label 2 physical and 2 cognitive deficits with Mod question cues SLP Short Term Goal 4 - Progress (Week 1): Progressing toward goal SLP Short Term Goal 5 (Week 1): Patient will sustain attention to basic tasks for 1 minute with Mod verbal cues for redirections SLP Short Term Goal 5 - Progress (Week 1): Progressing toward goal    New Short Term Goals: Week 2: SLP Short Term Goal 1 (Week 2): Patien will consume Dys. 2 textures and nectar-thick liquids via straw with Mod cues for recall and use of safe swallow strategies to minimize overt s/s of aspiration SLP Short Term Goal 2 (Week 2): Patient will recall and scan to the left of midline to locate items as needed, during basic functional tasks with Mod mulitmodal cues  SLP Short Term Goal 3 (Week 2): Patient will produce intelligible speech at the phrase-sentence level of vebral expression with Min vebral  cues to utilize speech intelligibilty strategies SLP Short Term Goal 4 (Week 2): Patient will label 2 physical and 2 cognitive deficits with Mod question cues SLP Short Term Goal 5 (Week 2): Patient will sustain attention to basic tasks for 1 minute with Mod verbal cues for redirections  Weekly Progress Updates: Patient has made some pre-functional gains in his ability to scan to midline and intermittently sustain attention for brief periods; however, he met 0 out of 5 short term goals this reporting period.   Currently, pain and therefore pain medication impacts function and patient continues to require overall Max assist for basic self-care tasks and Mod cues for speech intelligibility.  Patient and family education is ongoing. Patient would benefit from continued skilled SLP intervention to his functional independence and reduce overall burden of acre prior to discharge to SNF.    Intensity: Minumum of 1-2 x/day, 30 to 90 minutes Frequency: 5 out of 7 days Duration/Length of Stay: 12/30 to SNF Treatment/Interventions: Cognitive remediation/compensation;Cueing hierarchy;Dysphagia/aspiration precaution training;Environmental controls;Functional tasks;Internal/external aids;Patient/family education;Oral motor exercises;Speech/Language facilitation;Therapeutic Activities   Carmelia Roller., CCC-SLP [phone]  [place] 02/24/2014, 5:14 PM

## 2014-02-24 NOTE — Progress Notes (Signed)
Physical Therapy Weekly Progress Note  Patient Details  Name: Gregory Wiley MRN: [id number] Date of Birth: 1950-07-05  Beginning of progress report period: February 15, 2014 End of progress report period: February 24, 2014  Today's Date: 02/24/2014 PT Co-Treatment Time: 1300-1330 PT Co-Treatment Time Calculation (min): 30 min (full tx time 60 min)  Patient has met 3 of 5 short term goals.  Patient currently requires +2 assist for safe mobility with slide board transfers, standing tasks, and transfers via [place]. Pt currently unable to attempt gait or stairs due to L LE pain with WB. Pt is able to perform bed mobility with use of hospital bed functions with total A x 1 to +2 assist. Patient continues to be limited by pain in L > R ankle, L shoulder, neck, and back with functional mobility and w/c seating and positioning. Patient has demonstrated improvement the last several days due to improved pain management and ability to spontaneously scan to the left.   Patient continues to demonstrate the following deficits: L ankle pain, impaired balance, left sided hemiplegia, muscle weakness, decreased endurance, apraxia, decreased attention to left, decreased attention, decreased awareness, decreased problem solving, decreased safety awareness, and decreased memory and therefore will continue to benefit from skilled PT intervention to enhance overall performance with activity tolerance, balance, postural control, ability to compensate for deficits, functional use of  left upper extremity and left lower extremity, attention and awareness.  Patient progressing toward long term goals.  Continue plan of care.  PT Short Term Goals Week 1:  PT Short Term Goal 1 (Week 1): Pt will perform rolling to L with min A and to R with total A x 1.  PT Short Term Goal 1 - Progress (Week 1): Progressing toward goal PT Short Term Goal 2 (Week 1): Pt will perform sit <> supine with total A x 1.  PT Short Term Goal 2 -  Progress (Week 1): Progressing toward goal PT Short Term Goal 3 (Week 1): Pt will perform bed <> w/c transfer with +2 assist.  PT Short Term Goal 3 - Progress (Week 1): Met PT Short Term Goal 4 (Week 1): Pt will maintain dynamic sitting balance x 3 min with supervision.  PT Short Term Goal 4 - Progress (Week 1): Met PT Short Term Goal 5 (Week 1): Pt will propel w/c x 50 ft with min A. PT Short Term Goal 5 - Progress (Week 1): Met Week 2:  PT Short Term Goal 1 (Week 2): Pt will perform rolling to L with min A and to R with total A x 1.  PT Short Term Goal 2 (Week 2): Pt will perform sit <> supine with total A x 1 with use of hospital bed functions. PT Short Term Goal 3 (Week 2): Pt will perform dynamic standing balance x 1 min with +2 assist. PT Short Term Goal 4 (Week 2): Patient will perform bed <> w/c transfer with mod A x 2.   Skilled Therapeutic Interventions/Progress Updates:   Pt received semi reclined in bed, reporting increased soreness in LUE and that "Ultram is killing me." Pt transferred supine > sit with HOB elevated and use of rail with total A x 1. Pt performed slide board transfer to R with increased time and max A x 2, pt continues to require verbal cues for hand placement, head hips relationship, and anterior weight shift. Pt attempts to pull himself toward chair instead of shifting weight forward and pushing up with RUE in  order to scoot. Pt performed w/c <> mat transfer via [place] with +2 assist. From raised table, pt performed sit <> stand x 2 with +2 assist and therapist stabilizing LLE. Attempted reaching task in standing but pt needing to sit due to increased L LE pain and nausea/lightheadedness. Seated BP 138/78. Pt performed seated dynamic balance and scanning to L with LUE WB and reaching to L with RUE for clothespins. Attempted standing with bari RW and +2 assist, patient able to stand with one therapist under LUE and L knee stabilized x 30 sec. Pt with improved ability to  reposition hips back in chair with tilt in space function and verbal cues. Pt left sitting in w/c with call bell within reach and wife present in room.  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  Restrictions Weight Bearing Restrictions: No  See FIM for current functional status  Therapy/Group: Co-Treatment with OT  Carney Living A 02/24/2014, 2:36 PM

## 2014-02-24 NOTE — Consult Note (Signed)
NEUROCOGNITIVE STATUS EXAMINATION - [place]   Mr. Aston Lieske is a 63 year old man, who was seen for a brief neurocognitive status examination to evaluate his emotional state and mental status post-stroke.  According to his medical record, he was admitted on 02/12/14 with left-sided weakness with unsteady gait/fall, left facial droop and slurred speech.  He had been off ASA/Plavix due to recent bleeding ulcer.  His family also noted that he had been noncompliant with medications and use of CPAP.  While in the ED, he experienced progressive weakness.  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  Loop recorder was placed to rule out cryptogenic causes of stroke.  He was found to have moderate dysarthria with poor attention to task as well as left inattention and was deemed appropriate for inpatient rehabilitation.    Emotional Functioning:  During the clinical interview, Mr. Tourigny reported frustration with the slow pace of recovery and generally seemed to have low mood, commenting that he "wouldn't wish this on anyone." Still, he denied suicidal ideation, intent, or plan.  He mentioned that he is able to see "a little progress" in his speech, but otherwise has not noticed progress.  He stated that severe fatigue due to trouble sleeping at night as well as significant pain from gout have been preventing him from feeling motivated to participate in his various therapies.  Mr. Schreiner also commented that although he knows logically that he will slowly get better, there is a part of him that worries that he will never recover.  Of note, during the first part of this session, Mr. Mulvey was falling asleep off and on and said that he had just taken pain medication that was making him drowsy.   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  Time was spent exploring coping mechanisms that he uses to deal with frustration and he stated that he uses prayer and takes a break when he feels frustrated.  He also seems to lean on his wife for support during this difficult time.  Finally, he mentioned that he has a passion for music and we explored the effect that having music playing or singing may serve in increasing his motivation to participate in therapies.    Mr. Hillyard responses to a self-report measure of symptoms of depression were not indicative of the presence of clinically significant depression at this time.    Mental Status:  Mr. Dambrosia total score on a very brief measure of mental status was not suggestive of the presence of dementia (MMSE-2 brief = 13/16).  He lost points for misstating the day of the week and date as well as for failing to freely recall all of three previously studied words after a brief delay.    Impressions and Recommendations:  Mr. Piatek overall neurocognitive profile was not suggestive of marked cognitive disruption at this time.  However, if he notices difficulty in cognitive functioning as he attempts to transition back home, more comprehensive testing as an outpatient would be warranted and information on a neuropsychologist near to him should be included in his discharge paperwork.  From an emotional standpoint, Mr. Abdalla did not endorse symptoms suggestive of marked mood disruption.  However, he subjectively described low mood, which he said is adversely impacting his motivation to participate in various therapies.  Based on our discussion today, it may be helpful for his physician to consider initiating a sleep aid to help reduce daytime fatigue.  Also, therapists may choose to play music during his sessions (he could pull it up on his cell phone) in order to  improve his participation during therapies.  Finally, I encouraged Mr. Gautier to sing, as this is a passion of his and may help to elevate his mood.  Other staff members may encourage this as well.  If schedules permit, I will see Mr. Hoogendoorn in follow-up next week to re-evaluate mood and provide continued support.    DIAGNOSIS:   CVA  Marlane Hatcher, Psy.D.  Clinical Neuropsychologist

## 2014-02-24 NOTE — Progress Notes (Signed)
Occupational Therapy Weekly Progress Note  Patient Details  Name: Gregory Wiley MRN: [id number] Date of Birth: Sep 20, 1950  Beginning of progress report period: February 16, 2014 End of progress report period: February 24, 2014   Patient has met 2 of 5 short term goals.  [name] has demonstrated some progress with OT this week but is still limited secondary to left ankle pain.  He continues to need total assist +2 (pt 25%) for rolling to the right side in supine and min assist to the left for peri cleaning and donning brief.  He is able to stand with total assist +2 (pt 25%) for pulling shorts over his hips.  Left visual field deficit and neglect still present however he is able to turn his head to the left and locate therapist or wife when they are talking to him.  Still needs mod questioning cueing to locate inanimate objects such as soap and washcloth when performing grooming and bathing.  Brunnstrum stage II in the left arm with trace shoulder flexion and abduction present but no active movement noted in the digits at this time.  Pt and wife have been educated on PROM exercises for the left arm.  Pt needs total assist +2 for all transfers via sliding board.  Have not attempted transfer to drop arm commode.  Recommended continued OT to further progress selfcare function and transfers.    Patient continues to demonstrate the following deficits: decreased balance, left ankle pain, left neglect, LUE and LLE weakness and therefore will continue to benefit from skilled OT intervention to enhance overall performance with BADL.  Patient progressing toward long term goals..  Continue plan of care.  OT Short Term Goals Week 2:  OT Short Term Goal 1 (Week 2): Pt will perform toilet transfer sliding board or squat pivot with total assist +2 (pt 25%) to bariatric drop arm commode.  OT Short Term Goal 2 (Week 2): Pt will roll to the right side in bed during LB selfcare with max assist and mod instructional  cueing for sequencing. OT Short Term Goal 3 (Week 2): Pt will perform UB dressing with min assist and mod instructional cueing.   OT Short Term Goal 4 (Week 2): Pt will be able to perform LB bathing sit to stand with total assist +2 (pt 30%).   OT Short Term Goal 5 (Week 2): Pt will locate bathing and grooming items left of midline with no more than mod questioning cues. .  Skilled Therapeutic Interventions/Progress Updates:      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  Restrictions Weight Bearing Restrictions: No General:   Vital Signs: Therapy Vitals Temp: 97.7 F (36.5 C) Temp Source: Oral Pulse Rate: 70 Resp: 18 BP: 116/69 mmHg Patient Position (if appropriate): Sitting Oxygen Therapy SpO2: 95 % O2 Device: Not Delivered  ADL: See FIM for current functional status  Therapy/Group: Individual Therapy  Simra Fiebig OTR/L 02/24/2014, 4:24 PM

## 2014-02-25 ENCOUNTER — Inpatient Hospital Stay (HOSPITAL_COMMUNITY): Payer: 59 | Admitting: Occupational Therapy

## 2014-02-25 ENCOUNTER — Inpatient Hospital Stay (HOSPITAL_COMMUNITY): Payer: 59 | Admitting: *Deleted

## 2014-02-25 ENCOUNTER — Inpatient Hospital Stay (HOSPITAL_COMMUNITY): Payer: 59 | Admitting: Speech Pathology

## 2014-02-25 DIAGNOSIS — M25572 Pain in left ankle and joints of left foot: Secondary | ICD-10-CM

## 2014-02-25 MED ORDER — ENSURE PUDDING PO PUDG
1.0000 | Freq: Three times a day (TID) | ORAL | Status: DC
  Administered 2014-02-25 – 2014-03-14 (×46): 1 via ORAL

## 2014-02-25 MED ORDER — GLUCERNA SHAKE PO LIQD
237.0000 mL | Freq: Every day | ORAL | Status: DC
  Administered 2014-02-25 – 2014-03-01 (×4): 237 mL via ORAL

## 2014-02-25 MED ORDER — ALPRAZOLAM 0.25 MG PO TABS
0.1250 mg | ORAL_TABLET | Freq: Three times a day (TID) | ORAL | Status: DC | PRN
  Administered 2014-02-25 – 2014-03-14 (×3): 0.125 mg via ORAL
  Filled 2014-02-25 (×6): qty 1

## 2014-02-25 MED ORDER — SODIUM CHLORIDE 0.9 % IV SOLN
INTRAVENOUS | Status: AC
  Administered 2014-02-25: 1000 mL via INTRAVENOUS
  Administered 2014-02-27: 20:00:00 via INTRAVENOUS

## 2014-02-25 NOTE — Progress Notes (Signed)
Subjective/Complaints: Slept ok, still requiring IVF for hydration, nectar thick liquid restriction Constipated requiring supp  Review of Systems - Negative except above    Poor awareness of deficits secondary to R MCA lg infarct Objective: Vital Signs: Blood pressure 119/83, pulse 75, temperature 98.3 F (36.8 C), temperature source Oral, resp. rate 19, height 6\' 1"  (1.854 m), weight 157.398 kg (347 lb), SpO2 95 %. No results found. No results found for this or any previous visit (from the past 72 hour(s)).   HEENT: normal Cardio: RRR and no murmur Resp: CTA B/L and unlabored GI: BS positive and NT, ND Extremity:  Pulses positive and No Edema Skin:   Intact Neuro: Alert/Oriented, Cranial Nerve Abnormalities Left central 7, Normal Sensory, Abnormal Motor 0/5 LUE and LLE persistent. Does sense pain. , Dysarthric and left Inattention, right gaze preference better MAS 2-3 Left elbow flexors  Musc/Skel:  Other no pain with Left shoulder ROM MSK- Occ pain with ankle DF/PF Gen NAD   Assessment/Plan: 1. 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 Physiatrist is providing close team supervision and 24 hour management of active medical problems listed below. Physiatrist and rehab team continue to assess barriers to discharge/monitor patient progress toward functional and medical goals.  FIM: FIM - Bathing Bathing Steps Patient Completed: Chest, Left Arm, Abdomen, Right upper leg, Left upper leg, Right lower leg (including foot), Left lower leg (including foot) Bathing: 1: Two helpers  FIM - Upper Body Dressing/Undressing Upper body dressing/undressing steps patient completed: Thread/unthread right sleeve of pullover shirt/dresss, Thread/unthread left sleeve of pullover shirt/dress, Put head through opening of  pull over shirt/dress, Pull shirt over trunk Upper body dressing/undressing: 4: Min-Patient completed 75 plus % of tasks FIM - Lower Body Dressing/Undressing Lower body dressing/undressing steps patient completed: Thread/unthread right pants leg, Thread/unthread left pants leg, Pull pants up/down, Don/Doff right sock, Don/Doff left sock Lower body dressing/undressing: 1: Two helpers  FIM - Toileting Toileting: 0: Activity did not occur  FIM - Air cabin crew Transfers: 0-Activity did not occur  FIM - Control and instrumentation engineer Devices: Sliding board, HOB elevated, Bed rails Bed/Chair Transfer: 1: Two helpers, 1: Supine > Sit: Total A (helper does all/Pt. < 25%)  FIM - Locomotion: Wheelchair Distance: 100 Locomotion: Wheelchair: 1: Total Assistance/staff pushes wheelchair (Pt<25%) FIM - Locomotion: Ambulation Locomotion: Ambulation: 0: Activity did not occur  Comprehension Comprehension Mode: Auditory Comprehension: 5-Understands basic 90% of the time/requires cueing < 10% of the time  Expression Expression Mode: Verbal Expression: 3-Expresses basic 50 - 74% of the time/requires cueing 25 - 50% of the time. Needs to repeat parts of sentences.  Social Interaction Social Interaction: 2-Interacts appropriately 25 - 49% of time - Needs frequent redirection.  Problem Solving Problem Solving: 2-Solves basic 25 - 49% of the time - needs direction more than half the time to initiate, plan or complete simple activities  Memory Memory: 4-Recognizes or recalls 75 - 89% of the time/requires cueing 10 - 24% of the time  Medical Problem List and Plan: 1. Functional deficits secondary to Right MCA infarct with left hemiparesis, dysarthria, dysphagia, and visual spatial deficits 2. DVT Prophylaxis/Anticoagulation: Pharmaceutical: Lovenox 3. New onset Headaches/Pain Management: Continue tylenol prn for now. Left ankle pain is gout  , start steroid burst and  taper. Mood: Poor frustration tolerance noted. LCSW to follow for evaluation and support.  Monitor CBG on prednisone 5. Neuropsych: This patient is not capable of making decisions on his own behalf. 6. Skin/Wound Care: Routine pressure relief measures.  7. Fluids/Electrolytes/Nutrition: Monitor I/O. Check follow up labs in am. Offer supplements as indicated if intake poor.  8. HTN: Will monitor every 8 hours. Currently controlled of medications (and to avoid hypotension). will resume medications as indicated. Continue     10 OSA: Continue CPAP (has home machine) whenever asleep. poor compliance, discussed increase CVA risk, ? desat at noc 11. Recent esophagitis/duodenitis due to gastric ulcer: Protonix bid to help promote healing 12. CAD: Has chest wall pain due to poorly Healed sternum. Continue ASA, plavix, lipitor--off imdur and coreg  13.  Left ankle pain with ROM and wt bearing, non progressive, exam with minimal swelling, no erythema, not consistent, xray with mod OA, tramadol is helping but makes him drowsy, avoid NSAID secondary to recent CVA 14.  Coag neg staph in urine, no fever or dysuria,monitor for symptoms LOS (Days) 10 A FACE TO FACE EVALUATION WAS PERFORMED  KIRSTEINS,ANDREW E 02/25/2014, 7:22 AM

## 2014-02-25 NOTE — Progress Notes (Signed)
Assisted pt. With placing on his home cpap. Pt. Tolerating well at this time.

## 2014-02-25 NOTE — Progress Notes (Signed)
NUTRITION FOLLOW UP  DOCUMENTATION CODES Per approved criteria  -Morbid Obesity   INTERVENTION: Provide Ensure Pudding po TID, each supplement provides 170 kcal and 4 grams of protein.  Provide 30 ml Prostat po BID, each supplement provides 100 kcal and 15 grams of protein.   Provide Glucerna Shake po once daily thickened to nectar thick consistency, each supplement provides 220 kcal and 10 grams of protein  Encourage adequate PO intake.  NUTRITION DIAGNOSIS: Inadequate oral intake related to dysphagia, s/p stroke as evidenced by varied meal completion of 0-100%; ongoing  Goal: Pt to meet >/= 90% of their estimated nutrition needs; met  Monitor:  PO intake, weight trends, labs, I/O's  63 y.o. 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 MRI brain revealed acute infarct right MCA territory involving the right temporal lobe, insula, and basal ganglia and abrupt occlusion right M1 segment question due to embolus.  Meal completion has varied from 10-100%. Today's meal completion has only been 10-15%. Spoke with family at bedside, they report pt has not been eating well due to a dislike of food served. Family reports pt will be agreeable to additional supplements. RD to order. Family report PTA pt was eating well with no difficulties. Family reports they will encourage the pt to eat his food at meals and to take his supplements. Discussed with family that they may bring in food from home that pt favors as long as it meets dysphagia 2 diet guidelines.    Height: Ht Readings from Last 1 Encounters:  02/15/14 _0  (1.854 m)    Weight: Wt Readings from Last 1 Encounters:  02/15/14 347 lb (157.398 kg)    BMI:  Body mass index is 45.79 kg/(m^2). Morbid  obesity  Estimated Nutritional Needs: Kcal: 2100-2400 Protein: 120-150 grams Fluid: 2.1 - 2.4 L/day  Skin: Incision left chest, non-pitting LE edema  Diet Order: DIET DYS 2 with nectar thick liquids   Intake/Output Summary (Last 24 hours) at 02/25/14 1429 Last data filed at 02/25/14 1300  Gross per 24 hour  Intake 1020.83 ml  Output    800 ml  Net 220.83 ml    Last BM: 12/10  Labs:   Recent Labs Lab 02/19/14 0450  NA 138  K 4.3  CL 101  CO2 24  BUN 21  CREATININE 1.04  CALCIUM 9.0  GLUCOSE 107*    CBG (last 3)  No results for input(s): GLUCAP in the last 72 hours.  Scheduled Meds: . aspirin EC  81 mg Oral Daily  . atorvastatin  80 mg Oral q1800  . clopidogrel  75 mg Oral Daily  . enoxaparin (LOVENOX) injection  80 mg Subcutaneous Q24H  . feeding supplement (ENSURE)  1 Container Oral BID BM  . feeding supplement (PRO-STAT SUGAR FREE 64)  30 mL Oral BID BM  . fluticasone  2 spray Each Nare Daily  . gabapentin  100 mg Oral TID  . pantoprazole sodium  40 mg Per Tube BID  . sennosides  5 mL Oral BID  . sodium chloride 0.9 % 1,000 mL infusion   Intravenous Daily  . tamsulosin  0.4 mg Oral QPC supper    Continuous Infusions:   Past Medical History  Diagnosis Date  . Coronary artery disease     stent, then CABG 07/20/10  .  MI (myocardial infarction)     NSTEMI- last cath 06/2011-Stent to LCX-DES, last nuc 07/30/11 low risk  . Diabetes mellitus     hx of, diet controlled  . Hypertension   . Hyperlipidemia   . Obesity (BMI 30-39.9)   . OSA on CPAP     since July 2013  . H/O cardiomyopathy     ischemic, now last echo 07/30/11, EF 38%W  . Diverticulosis   . Internal hemorrhoids   . Tubular adenoma of colon   . Plantar fasciitis of left foot   . Stroke     Past Surgical History  Procedure Laterality Date  . Coronary artery bypass graft  07/20/2010     LIMA-LAD; VG-ACUTE MARG of RCA; Seq VG-distal RCA & then pda  . Coronary angioplasty with stent  placement  07/01/2011    DES-Resolute to native LCX  . Coronary angioplasty with stent placement  12/01/2007    COMPLEX 5 LESION PCI INCLUDING CUTTING BALLOON AND 4 CYPHER DESs  . Hernia repair  2006  . Knee surgery Right   . Hand surgery      to take glass out  . Colonoscopy N/A 02/10/2013    Procedure: COLONOSCOPY;  Surgeon: Ladene Artist, MD;  Location: [facility] ENDOSCOPY;  Service: Endoscopy;  Laterality: N/A;  . Retinal detachment surgery    . Loop recorder implant  02/15/2014    MDT LINQ implanted by Dr Rayann Heman for cryptogenic stroke  . Tee without cardioversion N/A 02/15/2014    Procedure: TRANSESOPHAGEAL ECHOCARDIOGRAM (TEE);  Surgeon: Josue Hector, MD;  Location: [facility] ENDOSCOPY;  Service: Cardiovascular;  Laterality: N/A;  . Left heart catheterization with coronary/graft angiogram  07/01/2011    Procedure: LEFT HEART CATHETERIZATION WITH Beatrix Fetters;  Surgeon: Sanda Klein, MD;  Location: [facility] CATH LAB;  Service: Cardiovascular;;  . Percutaneous coronary stent intervention (pci-s) Right 07/01/2011    Procedure: PERCUTANEOUS CORONARY STENT INTERVENTION (PCI-S);  Surgeon: Sanda Klein, MD;  Location: [facility] CATH LAB;  Service: Cardiovascular;  Laterality: Right;    Kallie Locks, MS, RD, LDN Pager # [phone] After hours/ weekend pager # [phone]

## 2014-02-25 NOTE — Progress Notes (Signed)
Occupational Therapy Session Note  Patient Details  Name: Gregory Wiley MRN: [id number] Date of Birth: 02-May-1950  Today's Date: 02/25/2014 OT Individual Time: 1002-1102 OT Individual Time Calculation (min): 60 min    Short Term Goals: Week 2:  OT Short Term Goal 1 (Week 2): Pt will perform toilet transfer sliding board or squat pivot with total assist +2 (pt 25%) to bariatric drop arm commode.  OT Short Term Goal 2 (Week 2): Pt will roll to the right side in bed during LB selfcare with max assist and mod instructional cueing for sequencing. OT Short Term Goal 3 (Week 2): Pt will perform UB dressing with min assist and mod instructional cueing.   OT Short Term Goal 4 (Week 2): Pt will be able to perform LB bathing sit to stand with total assist +2 (pt 30%).   OT Short Term Goal 5 (Week 2): Pt will locate bathing and grooming items left of midline with no more than mod questioning cues. .  Skilled Therapeutic Interventions/Progress Updates:  Upon entering the room, pt seated in wheelchair with wife present. Pt reporting no pain at this time. OT session with focus on self care, L attention, functional transfers, and pt/family education. Pt began session seated at sink for UB bathing and dressing with max verbal cues for initiation of tasks. Pt returning to bed via slide board and Max A of 2 for transfer to bed. Bed mobility of sit >supine with total assist as 2 people required to assist. Pt began to be very anxious during transfer by asking to be put into sling, reporting difficulty breathing and nausea. Vital signs checked and all within normal range. Pt reporting that he was feeling "anxious" about transfers. OT notified RN of concern. LB bathing and dressing performed while supine in bed with 2 person assist. Pt remained in bed with bed alarm on and call bell within reach upon exiting.  Therapy Documentation Precautions:  Precautions Precautions: Fall Precaution Comments: L neglect, L  hemiplegia, morbid obesity, gout in both ankles with the left being the worst  Restrictions Weight Bearing Restrictions: No Pain: Pain Assessment Pain Assessment: 0-10 Pain Score: 9  Pain Type: Acute pain Pain Location: Ankle Pain Orientation: Left Pain Descriptors / Indicators: Tender Pain Onset: On-going Pain Intervention(s): RN made aware;Repositioned;Ambulation/increased activity Multiple Pain Sites: No  See FIM for current functional status  Therapy/Group: Individual Therapy  Phineas Semen 02/25/2014, 1:12 PM

## 2014-02-25 NOTE — Progress Notes (Signed)
Speech Language Pathology Daily Session Note  Patient Details  Name: Gregory Wiley MRN: [id number] Date of Birth: [birth year]  Today's Date: 02/25/2014 SLP Individual Time: 1300-1400 SLP Individual Time Calculation (min): 60 min  Short Term Goals: Week 2: SLP Short Term Goal 1 (Week 2): Patien will consume Dys. 2 textures and nectar-thick liquids via straw with Mod cues for recall and use of safe swallow strategies to minimize overt s/s of aspiration SLP Short Term Goal 2 (Week 2): Patient will recall and scan to the left of midline to locate items as needed, during basic functional tasks with Mod mulitmodal cues  SLP Short Term Goal 3 (Week 2): Patient will produce intelligible speech at the phrase-sentence level of vebral expression with Min vebral cues to utilize speech intelligibilty strategies SLP Short Term Goal 4 (Week 2): Patient will label 2 physical and 2 cognitive deficits with Mod question cues SLP Short Term Goal 5 (Week 2): Patient will sustain attention to basic tasks for 1 minute with Mod verbal cues for redirections  Skilled Therapeutic Interventions: Skilled treatment session focused on dysphagia and cognitive goals. Upon arrival, patient was awake while supine in bed and required encouragement for participation. Patient was transferred to the wheelchair via the [facility] to increase arousal and attention. Patient required supervision verbal cues for functional problem solving with oral care with a suction toothbrush and this clinician also performed oral care for thoroughness. Patient consumed trials of thin liquids via cup without overt s/s of aspiration and utilized small sips with supervision verbal cues. SLP also facilitated session by providing supervision verbal cues for attention to left field of environment and problem solving during a basic calendar making task. Family present throughout session and provided appropriate cueing and encouragement. Patient left in wheelchair  with quick release belt in place and family present. Continue with current plan of care.    FIM:  Comprehension Comprehension Mode: Auditory Comprehension: 5-Understands basic 90% of the time/requires cueing < 10% of the time Expression Expression Mode: Verbal Expression: 3-Expresses basic 50 - 74% of the time/requires cueing 25 - 50% of the time. Needs to repeat parts of sentences. Social Interaction Social Interaction: 3-Interacts appropriately 50 - 74% of the time - May be physically or verbally inappropriate. Problem Solving Problem Solving: 3-Solves basic 50 - 74% of the time/requires cueing 25 - 49% of the time Memory Memory: 4-Recognizes or recalls 75 - 89% of the time/requires cueing 10 - 24% of the time FIM - Eating Eating Activity: 5: Supervision/cues;5: Set-up assist for open containers  Pain Pain Assessment Pain Assessment: No/denies pain  Therapy/Group: Individual Therapy  Gregory Wiley 02/25/2014, 4:49 PM

## 2014-02-25 NOTE — Progress Notes (Signed)
Physical Therapy Session Note  Patient Details  Name: Gregory Wiley MRN: [id number] Date of Birth: October 28, 1950  Today's Date: 02/25/2014 PT Individual Time: 0900-1000 PT Individual Time Calculation (min): 60 min   Short Term Goals: Week 2:  PT Short Term Goal 1 (Week 2): Pt will perform rolling to L with min A and to R with total A x 1.  PT Short Term Goal 2 (Week 2): Pt will perform sit <> supine with total A x 1 with use of hospital bed functions. PT Short Term Goal 3 (Week 2): Pt will perform dynamic standing balance x 1 min with +2 assist. PT Short Term Goal 4 (Week 2): Patient will perform bed <> w/c transfer with mod A x 2.   Skilled Therapeutic Interventions/Progress Updates:    Patient received semi-reclined in bed. Session focused on sustained attention and initiation with functional transfers and cognitive remediation activity. Patient requires minA rolling L and totalA rolling R with use of bedrails to don pants with totalA. Supine>sit with HOB elevated and use of bed rails with +2 assist.Sara Plus lift utilize for transfer bed>wheelchair with +2 assist for safety. In gym, performed cognitive activity with emphasis on sequencing/turn-taking, following multi step commands with structured task of naming holiday-theme words that start with each letter of descending alphabet. Patient returned to room and left sitting in tilt-n-space wheelchair with rehab tech, nurse tech, and patient's wife present for supervision, awaiting OT arrival.  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 Kayci [place], PT, DPT 02/25/2014, 10:04 AM

## 2014-02-25 NOTE — Plan of Care (Signed)
Problem: RH BOWEL ELIMINATION Goal: RH STG MANAGE BOWEL WITH ASSISTANCE STG Manage Bowel with Mod Assistance.  Outcome: Progressing Goal: RH STG MANAGE BOWEL W/MEDICATION W/ASSISTANCE STG Manage Bowel with Medication with Mod Assistance.  Outcome: Progressing Goal: RH STG MANAGE BOWEL W/EQUIPMENT W/ASSISTANCE STG Manage Bowel With Equipment With Mod Assistance  Outcome: Progressing  Problem: RH BLADDER ELIMINATION Goal: RH STG MANAGE BLADDER WITH ASSISTANCE STG Manage Bladder With Total Assistance  Outcome: Progressing Goal: RH STG MANAGE BLADDER WITH MEDICATION WITH ASSISTANCE STG Manage Bladder With Medication With Mod Assistance.  Outcome: Progressing Goal: RH STG MANAGE BLADDER WITH EQUIPMENT WITH ASSISTANCE STG Manage Bladder With Equipment With Mod Assistance  Outcome: Progressing  Problem: RH SKIN INTEGRITY Goal: RH STG SKIN FREE OF INFECTION/BREAKDOWN Min Assist  Outcome: Progressing Goal: RH STG MAINTAIN SKIN INTEGRITY WITH ASSISTANCE STG Maintain Skin Integrity With [employer].  Outcome: Progressing  Problem: RH SAFETY Goal: RH STG ADHERE TO SAFETY PRECAUTIONS W/ASSISTANCE/DEVICE STG Adhere to Safety Precautions With Min Assistance/Device.  Outcome: Progressing Goal: RH STG DECREASED RISK OF FALL WITH ASSISTANCE STG Decreased Risk of Fall With [employer].  Outcome: Progressing  Problem: RH COGNITION-NURSING Goal: RH STG USES MEMORY AIDS/STRATEGIES W/ASSIST TO PROBLEM SOLVE STG Uses Memory Aids/Strategies With Mod Assistance to Problem Solve.  Outcome: Progressing Goal: RH STG ANTICIPATES NEEDS/CALLS FOR ASSIST W/ASSIST/CUES STG Anticipates Needs/Calls for Assist With Mod Assistance/Cues.  Outcome: Progressing  Problem: RH PAIN MANAGEMENT Goal: RH STG PAIN MANAGED AT OR BELOW PT'S PAIN GOAL < 4  Outcome: Progressing  Problem: RH KNOWLEDGE DEFICIT Goal: RH STG INCREASE KNOWLEDGE OF HYPERTENSION Mod Assist  Outcome: Progressing

## 2014-02-26 ENCOUNTER — Inpatient Hospital Stay (HOSPITAL_COMMUNITY): Payer: 59 | Admitting: Physical Therapy

## 2014-02-26 MED ORDER — SODIUM CHLORIDE 0.9 % IV SOLN
INTRAVENOUS | Status: AC
  Administered 2014-02-26 (×2): via INTRAVENOUS

## 2014-02-26 NOTE — Progress Notes (Signed)
Physical Therapy Session Note  Patient Details  Name: Gregory Wiley MRN: [id number] Date of Birth: 1950-04-05  Today's Date: 02/26/2014 PT Individual Time: 1300-1400 PT Individual Time Calculation (min): 60 min   Short Term Goals: Week 1:  PT Short Term Goal 1 (Week 1): Pt will perform rolling to L with min A and to R with total A x 1.  PT Short Term Goal 1 - Progress (Week 1): Progressing toward goal PT Short Term Goal 2 (Week 1): Pt will perform sit <> supine with total A x 1.  PT Short Term Goal 2 - Progress (Week 1): Progressing toward goal PT Short Term Goal 3 (Week 1): Pt will perform bed <> w/c transfer with +2 assist.  PT Short Term Goal 3 - Progress (Week 1): Met PT Short Term Goal 4 (Week 1): Pt will maintain dynamic sitting balance x 3 min with supervision.  PT Short Term Goal 4 - Progress (Week 1): Met PT Short Term Goal 5 (Week 1): Pt will propel w/c x 50 ft with min A. PT Short Term Goal 5 - Progress (Week 1): Met Week 2:  PT Short Term Goal 1 (Week 2): Pt will perform rolling to L with min A and to R with total A x 1.  PT Short Term Goal 2 (Week 2): Pt will perform sit <> supine with total A x 1 with use of hospital bed functions. PT Short Term Goal 3 (Week 2): Pt will perform dynamic standing balance x 1 min with +2 assist. PT Short Term Goal 4 (Week 2): Patient will perform bed <> w/c transfer with mod A x 2.  Skilled Therapeutic Interventions/Progress Updates:    Pt continues to be limited in session by weakness, L ankle pain, and assist needs for mobilization. Pt able to perform partial stands (glute clearance) with LE clearance using gait belt hammock with sheet tucked in. Standing activities with [name] deferred secondary to inability to find appropriate sling, despite strong desire to perform [name] activities. Pt and wife at times in session limited by rigidity in terms of treatment plan.  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 Static sitting with LUE forced use x5'. Pt educated on rehab plan, pain with activities, and progressing activities. Partial sit to stands with gait belt and sheet hammock Dx2.  See FIM for current functional status  Therapy/Group: Individual Therapy  Monia Pouch 02/26/2014, 7:01 PM

## 2014-02-26 NOTE — Progress Notes (Signed)
Subjective/Complaints: Slept ok, still requiring IVF for hydration, nectar thick liquid restriction Therapy "not so good" yesterday Review of Systems - Negative except above    Poor awareness of deficits secondary to R MCA lg infarct Objective: Vital Signs: Blood pressure 127/81, pulse 80, temperature 98.1 F (36.7 C), temperature source Oral, resp. rate 16, height 6\' 1"  (1.854 m), weight 157.398 kg (347 lb), SpO2 94 %. No results found. No results found for this or any previous visit (from the past 72 hour(s)).   HEENT: normal Cardio: RRR and no murmur Resp: CTA B/L and unlabored GI: BS positive and NT, ND Extremity:  Pulses positive and No Edema Skin:   Intact Neuro: Alert/Oriented, Cranial Nerve Abnormalities Left central 7, Normal Sensory, Abnormal Motor 0/5 LUE and LLE persistent. Does sense pain. , Dysarthric and left Inattention, right gaze preference better MAS 2-3 Left elbow flexors  Musc/Skel:  Other no pain with Left shoulder ROM MSK- Occ pain with ankle DF/PF Gen NAD   Assessment/Plan: 1. 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 Physiatrist is providing close team supervision and 24 hour management of active medical problems listed below. Physiatrist and rehab team continue to assess barriers to discharge/monitor patient progress toward functional and medical goals.  FIM: FIM - Bathing Bathing Steps Patient Completed: Chest, Left Arm, Right upper leg, Left upper leg, Abdomen Bathing: 1: Two helpers  FIM - Upper Body Dressing/Undressing Upper body dressing/undressing steps patient completed: Thread/unthread right sleeve of pullover shirt/dresss, Put head through opening of pull over shirt/dress, Pull shirt over trunk Upper body dressing/undressing: 4: Min-Patient completed 75 plus % of  tasks FIM - Lower Body Dressing/Undressing Lower body dressing/undressing steps patient completed: Thread/unthread right pants leg Lower body dressing/undressing: 1: Two helpers  FIM - Toileting Toileting: 0: Activity did not occur  FIM - Air cabin crew Transfers: 0-Activity did not occur  FIM - Control and instrumentation engineer Devices: Bed rails Bed/Chair Transfer: 1: Two helpers  FIM - Locomotion: Wheelchair Distance: 100 Locomotion: Wheelchair: 1: Total Assistance/staff pushes wheelchair (Pt<25%) FIM - Locomotion: Ambulation Ambulation/Gait Assistance: Not tested (comment) Locomotion: Ambulation: 0: Activity did not occur  Comprehension Comprehension Mode: Auditory Comprehension: 5-Understands basic 90% of the time/requires cueing < 10% of the time  Expression Expression Mode: Verbal Expression: 3-Expresses basic 50 - 74% of the time/requires cueing 25 - 50% of the time. Needs to repeat parts of sentences.  Social Interaction Social Interaction: 3-Interacts appropriately 50 - 74% of the time - May be physically or verbally inappropriate.  Problem Solving Problem Solving: 3-Solves basic 50 - 74% of the time/requires cueing 25 - 49% of the time  Memory Memory: 4-Recognizes or recalls 75 - 89% of the time/requires cueing 10 - 24% of the time  Medical Problem List and Plan: 1. Functional deficits secondary to Right MCA infarct with left hemiparesis, dysarthria, dysphagia, and visual spatial deficits 2. DVT Prophylaxis/Anticoagulation: Pharmaceutical: Lovenox 3. New onset Headaches/Pain Management: Continue tylenol prn for now. Left ankle pain is gout  , start steroid burst and taper. Mood: Poor frustration tolerance noted. LCSW to follow for evaluation and support. Monitor CBG on prednisone 5. Neuropsych: This patient is not capable of making decisions on his own behalf. 6. Skin/Wound Care: Routine pressure relief measures.  7.  Fluids/Electrolytes/Nutrition: Monitor I/O. Check follow up labs in am. Offer supplements as indicated if intake  poor.  8. HTN: Will monitor every 8 hours. Currently controlled of medications (and to avoid hypotension). will resume medications as indicated. Continue     10 OSA: Continue CPAP (has home machine) whenever asleep. poor compliance, discussed increase CVA risk, ? desat at noc 11. Recent esophagitis/duodenitis due to gastric ulcer: Protonix bid to help promote healing 12. CAD: Has chest wall pain due to poorly Healed sternum. Continue ASA, plavix, lipitor--off imdur and coreg  13.  Left ankle pain with ROM and wt bearing, non progressive, exam with minimal swelling, no erythema, not consistent, xray with mod OA, tramadol is helping but makes him drowsy, avoid NSAID secondary to recent CVA 14.  Coag neg staph in urine, no fever or dysuria,monitor for symptoms LOS (Days) 11 A FACE TO FACE EVALUATION WAS PERFORMED  Gregory Wiley 02/26/2014, 7:54 AM

## 2014-02-27 ENCOUNTER — Inpatient Hospital Stay (HOSPITAL_COMMUNITY): Payer: 59

## 2014-02-27 NOTE — Progress Notes (Signed)
Physical Therapy Session Note  Patient Details  Name: Gregory Wiley MRN: [id number] Date of Birth: May 17, 1950  Today's Date: 02/27/2014 PT Individual Time: 1330-1413 PT Individual Time Calculation (min): 43 min   Short Term Goals: Week 2:  PT Short Term Goal 1 (Week 2): Pt will perform rolling to L with min A and to R with total A x 1.  PT Short Term Goal 2 (Week 2): Pt will perform sit <> supine with total A x 1 with use of hospital bed functions. PT Short Term Goal 3 (Week 2): Pt will perform dynamic standing balance x 1 min with +2 assist. PT Short Term Goal 4 (Week 2): Patient will perform bed <> w/c transfer with mod A x 2.   Skilled Therapeutic Interventions/Progress Updates:   Pt presents in bed upon bedpan but unsuccessful with BM. Wife assisting with rolling to remove bed pan, don brief, and pull up pants. Cues for technique for pt and for wife for proper body mechanics for technique. Supine to sit EOB with max A for OOB transfer using Clarise Cruz Plus (ACE wrap to LUE to keep on Sara) to promote standing tolerance, WB though BLE and upright posture with +2 assist to get into w/c. Once in w/c focused on correct positioning for hip alignment and positioning of LUE supported with pillow for edema control. Total A +2 multiple times to get pt's hips back and aligned in chair. Safety belt donned and family present with pt at end of session.   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    See FIM for current functional status  Therapy/Group: Individual Therapy  Canary Brim [facility] 02/27/2014, 3:50 PM

## 2014-02-27 NOTE — Progress Notes (Signed)
Subjective/Complaints: Did ok overnite, no pain c/os  Review of Systems - Negative except above    Poor awareness of deficits secondary to R MCA lg infarct Objective: Vital Signs: Blood pressure 106/45, pulse 72, temperature 99.1 F (37.3 C), temperature source Oral, resp. rate 18, height 6\' 1"  (1.854 m), weight 157.398 kg (347 lb), SpO2 96 %. No results found. No results found for this or any previous visit (from the past 72 hour(s)).   HEENT: normal Cardio: RRR and no murmur Resp: CTA B/L and unlabored GI: BS positive and NT, ND Extremity:  Pulses positive and No Edema Skin:   Intact Neuro: Alert/Oriented, Cranial Nerve Abnormalities Left central 7, Normal Sensory, Abnormal Motor 0/5 LUE and LLE persistent. Does sense pain. , Dysarthric and left Inattention, right gaze preference better MAS 2-3 Left elbow flexors  Musc/Skel:  Other no pain with Left shoulder ROM MSK- Occ pain with ankle DF/PF Gen NAD   Assessment/Plan: 1. 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 Physiatrist is providing close team supervision and 24 hour management of active medical problems listed below. Physiatrist and rehab team continue to assess barriers to discharge/monitor patient progress toward functional and medical goals.  FIM: FIM - Bathing Bathing Steps Patient Completed: Chest, Left Arm, Right upper leg, Left upper leg, Abdomen Bathing: 1: Two helpers  FIM - Upper Body Dressing/Undressing Upper body dressing/undressing steps patient completed: Thread/unthread right sleeve of pullover shirt/dresss, Put head through opening of pull over shirt/dress, Pull shirt over trunk Upper body dressing/undressing: 4: Min-Patient completed 75 plus % of tasks FIM - Lower Body Dressing/Undressing Lower body dressing/undressing steps  patient completed: Thread/unthread right pants leg Lower body dressing/undressing: 1: Two helpers  FIM - Toileting Toileting: 0: Activity did not occur  FIM - Air cabin crew Transfers: 0-Activity did not occur  FIM - Control and instrumentation engineer Devices: HOB elevated, Bed rails Bed/Chair Transfer: 1: Two helpers, 1: Mechanical lift  FIM - Locomotion: Wheelchair Distance: 100 Locomotion: Wheelchair: 1: Total Assistance/staff pushes wheelchair (Pt<25%) FIM - Locomotion: Ambulation Ambulation/Gait Assistance: Not tested (comment) Locomotion: Ambulation: 0: Activity did not occur  Comprehension Comprehension Mode: Auditory Comprehension: 5-Understands basic 90% of the time/requires cueing < 10% of the time  Expression Expression Mode: Verbal Expression: 3-Expresses basic 50 - 74% of the time/requires cueing 25 - 50% of the time. Needs to repeat parts of sentences.  Social Interaction Social Interaction: 3-Interacts appropriately 50 - 74% of the time - May be physically or verbally inappropriate.  Problem Solving Problem Solving: 3-Solves basic 50 - 74% of the time/requires cueing 25 - 49% of the time  Memory Memory: 4-Recognizes or recalls 75 - 89% of the time/requires cueing 10 - 24% of the time  Medical Problem List and Plan: 1. Functional deficits secondary to Right MCA infarct with left hemiparesis, dysarthria, dysphagia, and visual spatial deficits 2. DVT Prophylaxis/Anticoagulation: Pharmaceutical: Lovenox 3. New onset Headaches/Pain Management: Continue tylenol prn for now. Left ankle pain is gout  , start steroid burst and taper. Mood: Poor frustration tolerance noted. LCSW to follow for evaluation and support. Monitor CBG on prednisone 5. Neuropsych: This patient is not capable of making decisions on his own behalf. 6. Skin/Wound Care: Routine pressure relief measures.  7. Fluids/Electrolytes/Nutrition: Monitor I/O. Check follow up labs  in am. Offer supplements as indicated if intake poor.  8. HTN:  Will monitor every 8 hours. Currently controlled of medications (and to avoid hypotension). will resume medications as indicated. Continue     10 OSA: Continue CPAP (has home machine) whenever asleep. poor compliance, discussed increase CVA risk, ? desat at noc 11. Recent esophagitis/duodenitis due to gastric ulcer: Protonix bid to help promote healing 12. CAD: Has chest wall pain due to poorly Healed sternum. Continue ASA, plavix, lipitor--off imdur and coreg  13.  Left ankle pain with ROM and wt bearing, non progressive, exam with minimal swelling, no erythema, not consistent, xray with mod OA, tramadol is helping but makes him drowsy, avoid NSAID secondary to recent CVA  LOS (Days) 12 A FACE TO FACE EVALUATION WAS PERFORMED  KIRSTEINS,ANDREW E 02/27/2014, 8:18 AM

## 2014-02-28 ENCOUNTER — Inpatient Hospital Stay (HOSPITAL_COMMUNITY): Payer: 59 | Admitting: Speech Pathology

## 2014-02-28 ENCOUNTER — Ambulatory Visit: Payer: 59

## 2014-02-28 ENCOUNTER — Inpatient Hospital Stay (HOSPITAL_COMMUNITY): Payer: 59 | Admitting: Physical Therapy

## 2014-02-28 ENCOUNTER — Encounter (HOSPITAL_COMMUNITY): Payer: 59 | Admitting: Occupational Therapy

## 2014-02-28 ENCOUNTER — Inpatient Hospital Stay (HOSPITAL_COMMUNITY): Payer: 59

## 2014-02-28 DIAGNOSIS — R1314 Dysphagia, pharyngoesophageal phase: Secondary | ICD-10-CM

## 2014-02-28 MED ORDER — SODIUM CHLORIDE 0.9 % IV SOLN: INTRAVENOUS | Status: DC

## 2014-02-28 MED ORDER — METHYLPHENIDATE HCL 5 MG PO TABS
5.0000 mg | ORAL_TABLET | Freq: Two times a day (BID) | ORAL | Status: DC
  Administered 2014-02-28 – 2014-03-15 (×29): 5 mg via ORAL
  Filled 2014-02-28 (×30): qty 1

## 2014-02-28 NOTE — Progress Notes (Signed)
Speech Language Pathology Daily Session Note  Patient Details  Name: Gregory Wiley MRN: [id number] Date of Birth: [birth year]  Today's Date: 02/28/2014 SLP Individual Time: 1000-1035 SLP Individual Time Calculation (min): 35 min  Short Term Goals: Week 2: SLP Short Term Goal 1 (Week 2): Patien will consume Dys. 2 textures and nectar-thick liquids via straw with Mod cues for recall and use of safe swallow strategies to minimize overt s/s of aspiration SLP Short Term Goal 2 (Week 2): Patient will recall and scan to the left of midline to locate items as needed, during basic functional tasks with Mod mulitmodal cues  SLP Short Term Goal 3 (Week 2): Patient will produce intelligible speech at the phrase-sentence level of vebral expression with Min vebral cues to utilize speech intelligibilty strategies SLP Short Term Goal 4 (Week 2): Patient will label 2 physical and 2 cognitive deficits with Mod question cues SLP Short Term Goal 5 (Week 2): Patient will sustain attention to basic tasks for 1 minute with Mod verbal cues for redirections  Skilled Therapeutic Interventions: Skilled treatment session focused on addressing cognitive goals. Patient required Mod cues to sustain attention to basic problem solving task for 2-3 minutes, which he was able to do 3-4 times throughout session with rest breaks.  Despite his report of being more out of it; patient appeared to attend as well as previous sessions.  During route finding back to patient's room he required Max cues to attend to left and utilize external aids.  Continue with current plan of care.   FIM:  Comprehension Comprehension Mode: Auditory Comprehension: 5-Understands basic 90% of the time/requires cueing < 10% of the time Expression Expression Mode: Verbal Expression: 3-Expresses basic 50 - 74% of the time/requires cueing 25 - 50% of the time. Needs to repeat parts of sentences. Social Interaction Social Interaction: 4-Interacts  appropriately 75 - 89% of the time - Needs redirection for appropriate language or to initiate interaction. Problem Solving Problem Solving: 3-Solves basic 50 - 74% of the time/requires cueing 25 - 49% of the time Memory Memory: 3-Recognizes or recalls 50 - 74% of the time/requires cueing 25 - 49% of the time  Pain Pain Assessment Pain Assessment: No/denies pain  Therapy/Group: Individual Therapy  Carmelia Roller., [place] [phone]  [place] 02/28/2014, 12:22 PM

## 2014-02-28 NOTE — Plan of Care (Signed)
Problem: RH BOWEL ELIMINATION Goal: RH STG MANAGE BOWEL W/EQUIPMENT W/ASSISTANCE STG Manage Bowel With Equipment With Mod Assistance  Outcome: Progressing LBM 02/28/2014

## 2014-02-28 NOTE — Progress Notes (Signed)
Subjective/Complaints: Poor sleep, "air blowing in my face" FLuid intake looks improved Meal intake 25-50%  Review of Systems - Negative except above    Poor awareness of deficits secondary to R MCA lg infarct Objective: Vital Signs: Blood pressure 113/65, pulse 74, temperature 97.9 F (36.6 C), temperature source Oral, resp. rate 18, height 6\' 1"  (1.854 m), weight 157.398 kg (347 lb), SpO2 94 %. No results found. No results found for this or any previous visit (from the past 72 hour(s)).   HEENT: normal Cardio: RRR and no murmur Resp: CTA B/L and unlabored GI: BS positive and NT, ND Extremity:  Pulses positive and No Edema Skin:   Intact Neuro: Alert/Oriented, Cranial Nerve Abnormalities Left central 7, Normal Sensory, Abnormal Motor 0/5 LUE and LLE persistent. Does sense pain. , Dysarthric and left Inattention, right gaze preference better MAS 2-3 Left elbow flexors  Musc/Skel:  Other no pain with Left shoulder ROM MSK- Occ pain with ankle DF/PF Gen NAD   Assessment/Plan: 1. 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 Physiatrist is providing close team supervision and 24 hour management of active medical problems listed below. Physiatrist and rehab team continue to assess barriers to discharge/monitor patient progress toward functional and medical goals.  FIM: FIM - Bathing Bathing Steps Patient Completed: Chest, Left Arm, Right upper leg, Left upper leg, Abdomen Bathing: 1: Two helpers  FIM - Upper Body Dressing/Undressing Upper body dressing/undressing steps patient completed: Thread/unthread right sleeve of pullover shirt/dresss, Put head through opening of pull over shirt/dress, Pull shirt over trunk Upper body dressing/undressing: 4: Min-Patient completed 75 plus % of tasks FIM - Lower Body  Dressing/Undressing Lower body dressing/undressing steps patient completed: Thread/unthread right pants leg Lower body dressing/undressing: 1: Two helpers  FIM - Toileting Toileting: 0: Activity did not occur  FIM - Air cabin crew Transfers: 0-Activity did not occur  FIM - Control and instrumentation engineer Devices: HOB elevated, Bed rails Bed/Chair Transfer: 2: Supine > Sit: Max A (lifting assist/Pt. 25-49%), 1: Mechanical lift, 1: Two helpers ([employer])  FIM - Locomotion: Wheelchair Distance: 100 Locomotion: Wheelchair: 1: Total Assistance/staff pushes wheelchair (Pt<25%) FIM - Locomotion: Ambulation Ambulation/Gait Assistance: Not tested (comment) Locomotion: Ambulation: 0: Activity did not occur  Comprehension Comprehension Mode: Auditory Comprehension: 5-Understands basic 90% of the time/requires cueing < 10% of the time  Expression Expression Mode: Verbal Expression: 3-Expresses basic 50 - 74% of the time/requires cueing 25 - 50% of the time. Needs to repeat parts of sentences.  Social Interaction Social Interaction: 3-Interacts appropriately 50 - 74% of the time - May be physically or verbally inappropriate.  Problem Solving Problem Solving: 3-Solves basic 50 - 74% of the time/requires cueing 25 - 49% of the time  Memory Memory: 4-Recognizes or recalls 75 - 89% of the time/requires cueing 10 - 24% of the time  Medical Problem List and Plan: 1. Functional deficits secondary to Right MCA infarct with left hemiparesis, dysarthria, dysphagia, and visual spatial deficits 2. DVT Prophylaxis/Anticoagulation: Pharmaceutical: Lovenox 3. New onset Headaches/Pain Management: Continue tylenol prn for now. Left ankle pain is gout  , start steroid burst and taper. Mood: Poor frustration tolerance noted. LCSW to follow for evaluation and support. Monitor CBG on prednisone 5. Neuropsych: This patient is not capable of making decisions on his own behalf. 6.  Skin/Wound Care: Routine pressure relief measures.  7.  Fluids/Electrolytes/Nutrition: Monitor I/O.Hold IVF, recheck BMET on 12/16 8. HTN: Will monitor every 8 hours. Currently controlled of medications (and to avoid hypotension). will resume medications as indicated. Continue     10 OSA: Continue CPAP (has home machine) whenever asleep. poor compliance, discussed increase CVA risk, ? desat at noc 11. Recent esophagitis/duodenitis due to gastric ulcer: Protonix bid to help promote healing 12. CAD: Has chest wall pain due to poorly Healed sternum. Continue ASA, plavix, lipitor--off imdur and coreg  13.  Left ankle pain with ROM and wt bearing, non progressive, exam with minimal swelling, no erythema, not consistent, xray with mod OA, tramadol is helping but makes him drowsy, avoid NSAID secondary to recent CVA  LOS (Days) 13 A FACE TO FACE EVALUATION WAS PERFORMED  KIRSTEINS,ANDREW E 02/28/2014, 7:54 AM

## 2014-02-28 NOTE — Progress Notes (Signed)
Occupational Therapy Session Note  Patient Details  Name: Gregory Wiley MRN: [id number] Date of Birth: 26-Jun-1950  Today's Date: 02/28/2014 OT Individual Time: 0800-0900 OT Individual Time Calculation (min): 60 min    Short Term Goals: Week 2:  OT Short Term Goal 1 (Week 2): Pt will perform toilet transfer sliding board or squat pivot with total assist +2 (pt 25%) to bariatric drop arm commode.  OT Short Term Goal 2 (Week 2): Pt will roll to the right side in bed during LB selfcare with max assist and mod instructional cueing for sequencing. OT Short Term Goal 3 (Week 2): Pt will perform UB dressing with min assist and mod instructional cueing.   OT Short Term Goal 4 (Week 2): Pt will be able to perform LB bathing sit to stand with total assist +2 (pt 30%).   OT Short Term Goal 5 (Week 2): Pt will locate bathing and grooming items left of midline with no more than mod questioning cues. .  Skilled Therapeutic Interventions/Progress Updates:    Pt performed bathing and dressing during session supine to sit.  Min assist for rolling to the left side and max assist to the right when performing peri washing and donning brief.  He was able to transition to sitting with total assist and then he was able to maintain static sitting with supervision.  Min assist for dynamic sitting with one LOB to the left when attempting to remove his shirt.  Mod questioning cueing to sequence through bathing as pt is disorgranized and wanted to wash his legs before washing his UB.  Performed sit to stand with total assist +2 (pt 25%) for pulling pants over hips.  He was able to [employer] with mod demonstrational cueing to sequence hemi techniques.  Sliding board transfer performed with +2 assist (pt 35%) to the wheelchair.  He needed mod assist for brushing his hair supported sitting in the wheelchair.    Therapy Documentation Precautions:  Precautions Precautions: Fall Precaution Comments: L neglect, L  hemiplegia, morbid obesity, gout in both ankles with the left being the worst  Restrictions Weight Bearing Restrictions: No  Pain: Pain Assessment Pain Assessment: Faces Pain Score: 0-No pain Faces Pain Scale: Hurts little more Pain Type: Acute pain Pain Location: Ankle Pain Orientation: Left Pain Onset: With Activity Pain Intervention(s): Distraction;Rest ADL: See FIM for current functional status  Therapy/Group: Individual Therapy  Elian Gloster OTR/L 02/28/2014, 12:08 PM

## 2014-02-28 NOTE — Progress Notes (Signed)
Assisted pt. With his home CPAP. CPAP is set on auto titrate (min: 4) via pt.'s nasal pillows. Pt. Is tolerating CPAP well at this time without any complications.

## 2014-02-28 NOTE — Progress Notes (Signed)
Physical Therapy Session Note  Patient Details  Name: Gregory Wiley MRN: [id number] Date of Birth: [birth year]  Today's Date: 02/28/2014 PT Individual Time: 0930-1000 PT Individual Time Calculation (min): 30 min   Short Term Goals: Week 2:  PT Short Term Goal 1 (Week 2): Pt will perform rolling to L with min A and to R with total A x 1.  PT Short Term Goal 2 (Week 2): Pt will perform sit <> supine with total A x 1 with use of hospital bed functions. PT Short Term Goal 3 (Week 2): Pt will perform dynamic standing balance x 1 min with +2 assist. PT Short Term Goal 4 (Week 2): Patient will perform bed <> w/c transfer with mod A x 2.   Skilled Therapeutic Interventions/Progress Updates:  1:1. Pt received sitting in w/c, ready for therapy. Focus this session on functional w/c propulsion and transfers as well as standing tolerance. Pt req max A for w/c propulsion 125'x2 with R LE, unable to utilize R UE as pt now sitting in tilt in space w/c and unable to manually propel rear wheel. Donnelly Angelica plus with Ax2 persons for t/f w/c<>tx mat and practice multiple t/f sit<>stands with emphasis on initiation>sustained glute contractions for proximal trunk control. Pt unable to maintain standing >15seconds due to muscular fatigue as well as increased pain in L ankle during standing. Pt left sitting in w/c at end of session w/ all needs in reach and quick release belt in place.   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  Restrictions Weight Bearing Restrictions: No   Pain: Pain Assessment Faces Pain Scale: Hurts little more Pain Type: Acute pain Pain Location: Ankle Pain Orientation: Left Pain Onset: With Activity Pain Intervention(s): Distraction;Rest  See FIM for current functional status  Therapy/Group: Individual Therapy  Gilmore Laroche 02/28/2014,  10:28 AM

## 2014-02-28 NOTE — Progress Notes (Signed)
Social Work Patient ID: Gregory Wiley, male   DOB: 12/27/1950, 63 y.o.   MRN: [id number] Met with pt and wife to discuss information needed for job, will contact [employer] and find out what exactly they need from us regarding FMLA. Pt is already receiving social security will see if he can pull the whole amount if considered disabled now.  It may have been too long since he began receiving Social Security. Still will find out for them.  Wife will have witnesses here for HCPOA next Monday at 7;00 pm.  Will try to schedule a notoray to come then.  

## 2014-02-28 NOTE — Progress Notes (Signed)
Physical Therapy Session Note  Patient Details  Name: Gregory Wiley MRN: [id number] Date of Birth: 1950/08/07  Today's Date: 02/28/2014 PT Individual Time: 1300-1400 PT Individual Time Calculation (min): 60 min   Short Term Goals: Week 2:  PT Short Term Goal 1 (Week 2): Pt will perform rolling to L with min A and to R with total A x 1.  PT Short Term Goal 2 (Week 2): Pt will perform sit <> supine with total A x 1 with use of hospital bed functions. PT Short Term Goal 3 (Week 2): Pt will perform dynamic standing balance x 1 min with +2 assist. PT Short Term Goal 4 (Week 2): Patient will perform bed <> w/c transfer with mod A x 2.   Skilled Therapeutic Interventions/Progress Updates:   Pt received semi reclined in bed, wife present for session. Pt transferred supine > sit with HOB elevated and use of rail with total A x 1. Pt performed slide board transfer bed > w/c with +2 assist, focus on anterior weight shift to achieve hip clearance and head hips relationship. In therapy gym, patient performed lateral scoot transfer to R and to L from w/c <> level mat table for first time without use of slide board and +2 assist due to improved LLE weightbearing and decreased c/o L ankle pain, increased time for pt to initiate scooting and use of Bobath technique to facilitate anterior weight shift. Pt requires step-by-step cues for initiation and sequencing of transfers. From slightly raised mat table, patient performed sit <> stand with +2 assist with therapist under LUE and preventing L knee buckling. Attempted sit <> stand with EVA walker x 3 attempts, able to maintain standing during final attempt but patient limited by L ankle pain. Pt returned to w/c with increased difficulty due to fatigue, see details above. Pt requires increased time and +2 assist to reposition hips back in wheelchair with use of tilt in space function. Pt returned to room and left sitting in w/c with call bell within reach and wife in  room. Patient with greatly improved participation with all transfers this session and decreased c/o L ankle pain overall.   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  Restrictions Weight Bearing Restrictions: No Pain: Pain Assessment Pain Assessment: Faces Faces Pain Scale: Hurts even more Pain Type: Acute pain Pain Location: Ankle Pain Orientation: Left Pain Descriptors / Indicators: Grimacing Pain Onset: With Activity Pain Intervention(s): Repositioned;Distraction  See FIM for current functional status  Therapy/Group: Individual Therapy  Laretta Alstrom 02/28/2014, 2:10 PM

## 2014-03-01 ENCOUNTER — Inpatient Hospital Stay (HOSPITAL_COMMUNITY): Payer: 59 | Admitting: Speech Pathology

## 2014-03-01 ENCOUNTER — Inpatient Hospital Stay (HOSPITAL_COMMUNITY): Payer: 59 | Admitting: Physical Therapy

## 2014-03-01 ENCOUNTER — Inpatient Hospital Stay (HOSPITAL_COMMUNITY): Payer: 59 | Admitting: Occupational Therapy

## 2014-03-01 NOTE — Discharge Instructions (Signed)
Inpatient Rehab Discharge Instructions  Gregory Wiley Discharge date and time:    Activities/Precautions/ Functional Status: Activity: activity as tolerated Diet: cardiac diet Wound Care: none needed Functional status:  ___ No restrictions     ___ Walk up steps independently _X__ 24/7 supervision/assistance   ___ Walk up steps with assistance ___ Intermittent supervision/assistance  ___ Bathe/dress independently ___ Walk with walker     _X__ Bathe/dress with assistance ___ Walk Independently    ___ Shower independently ___ Walk with assistance    ___ Shower with assistance _X__ No alcohol     ___ Return to work/school ________  Special Instructions:  STROKE/TIA DISCHARGE INSTRUCTIONS SMOKING Cigarette smoking nearly doubles your risk of having a stroke & is the single most alterable risk factor  If you smoke or have smoked in the last 12 months, you are advised to quit smoking for your health.  Most of the excess cardiovascular risk related to smoking disappears within a year of stopping.  Ask you doctor about anti-smoking medications  [place] Quit Line: 1-800-QUIT NOW  Free Smoking Cessation Classes [phone]  CHOLESTEROL Know your levels; limit fat & cholesterol in your diet  Lipid Panel     Component Value Date/Time   CHOL 181 02/13/2014 0537   TRIG 105 02/13/2014 0537   TRIG 113 01/04/2013 1206   HDL 40 02/13/2014 0537   CHOLHDL 4.5 02/13/2014 0537   VLDL 21 02/13/2014 0537   LDLCALC 120* 02/13/2014 0537   LDLCALC 129* 01/04/2013 1206      Many patients benefit from treatment even if their cholesterol is at goal.  Goal: Total Cholesterol (CHOL) less than 160  Goal:  Triglycerides (TRIG) less than 150  Goal:  HDL greater than 40  Goal:  LDL (LDLCALC) less than 100   BLOOD PRESSURE American Stroke Association blood pressure target is less that 120/80 mm/Hg  Your discharge blood pressure is:  BP: 121/65 mmHg  Monitor your blood pressure  Limit your salt and  alcohol intake  Many individuals will require more than one medication for high blood pressure  DIABETES (A1c is a blood sugar average for last 3 months) Goal HGBA1c is under 7% (HBGA1c is blood sugar average for last 3 months)  Diabetes: No known diagnosis of diabetes    Lab Results  Component Value Date   HGBA1C 5.6 02/13/2014     Your HGBA1c can be lowered with medications, healthy diet, and exercise.  Check your blood sugar as directed by your physician  Call your physician if you experience unexplained or low blood sugars.  PHYSICAL ACTIVITY/REHABILITATION Goal is 30 minutes at least 4 days per week  Activity: No driving, Therapies: See above Return to work: N/A  Activity decreases your risk of heart attack and stroke and makes your heart stronger.  It helps control your weight and blood pressure; helps you relax and can improve your mood.  Participate in a regular exercise program.  Talk with your doctor about the best form of exercise for you (dancing, walking, swimming, cycling).  DIET/WEIGHT Goal is to maintain a healthy weight  Your discharge diet is: DIET DYS 2    liquids Your height is:  Height: 6\' 1"  (185.4 cm) Your current weight is: Weight: (!) 157.398 kg (347 lb) Your Body Mass Index (BMI) is:  BMI (Calculated): 45.9  Following the type of diet specifically designed for you will help prevent another stroke.  Your goal weight is: 189 lbs  Your goal Body Mass Index (  BMI) is 19-24.  Healthy food habits can help reduce 3 risk factors for stroke:  High cholesterol, hypertension, and excess weight.  RESOURCES Stroke/Support Group:  Call [phone]   STROKE EDUCATION PROVIDED/REVIEWED AND GIVEN TO PATIENT Stroke warning signs and symptoms How to activate emergency medical system (call 911). Medications prescribed at discharge. Need for follow-up after discharge. Personal risk factors for stroke. Pneumonia vaccine given:  Flu vaccine given:  My questions have  been answered, the writing is legible, and I understand these instructions.  I will adhere to these goals & educational materials that have been provided to me after my discharge from the hospital.       My questions have been answered and I understand these instructions. I will adhere to these goals and the provided educational materials after my discharge from the hospital.  Patient/Caregiver Signature _______________________________ Date __________  Clinician Signature _______________________________________ Date __________  Please bring this form and your medication list with you to all your follow-up doctor's appointments.

## 2014-03-01 NOTE — Progress Notes (Signed)
Speech Language Pathology Daily Session Note  Patient Details  Name: Gregory Wiley MRN: [id number] Date of Birth: 31-Jan-1951  Today's Date: 03/01/2014 SLP Individual Time: 1300-1403 SLP Individual Time Calculation (min): 63 min  Short Term Goals: Week 2: SLP Short Term Goal 1 (Week 2): Patien will consume Dys. 2 textures and nectar-thick liquids via straw with Mod cues for recall and use of safe swallow strategies to minimize overt s/s of aspiration SLP Short Term Goal 2 (Week 2): Patient will recall and scan to the left of midline to locate items as needed, during basic functional tasks with Mod mulitmodal cues  SLP Short Term Goal 3 (Week 2): Patient will produce intelligible speech at the phrase-sentence level of vebral expression with Min vebral cues to utilize speech intelligibilty strategies SLP Short Term Goal 4 (Week 2): Patient will label 2 physical and 2 cognitive deficits with Mod question cues SLP Short Term Goal 5 (Week 2): Patient will sustain attention to basic tasks for 1 minute with Mod verbal cues for redirections  Skilled Therapeutic Interventions:  Pt was seen for skilled ST targeting goals for dysphagia and cognition.  Upon arrival, pt was seated upright in wheelchair, awake, alert, and agreeable to participate in ST with min encouragement.  SLP facilitated the session with trials of regular water with dys 2 textures.  Pt demonstrated no overt s/s of aspiration with cup sips of regular water; however, he exhibited slightly wet vocal quality with regular water via straw.  Therefore, recommend an upgrade to thin liquids with no straws to better control sip size.  SLP will continue to follow up closely for diet tolerance and provide ongoing assessment of readiness for diet advancement.  Furthermore, SLP facilitated the session with basic functional math problems targeting working memory, functional problem solving, and visual scanning to the left.  Pt answered functional word  problems for 100% accuracy with min-supervision cues for numerical reasoning and error awareness.  Throughout the abovementioned tasks, pt required overall mod assist verbal and visual cues to completely scan to the left margin of visually presented materials.  Pt is making good progress towards meeting goals.  Continue per current plan of care.       FIM:  Comprehension Comprehension Mode: Auditory Comprehension: 5-Understands basic 90% of the time/requires cueing < 10% of the time Expression Expression Mode: Verbal Expression: 4-Expresses basic 75 - 89% of the time/requires cueing 10 - 24% of the time. Needs helper to occlude trach/needs to repeat words. Social Interaction Social Interaction: 4-Interacts appropriately 75 - 89% of the time - Needs redirection for appropriate language or to initiate interaction. Problem Solving Problem Solving: 4-Solves basic 75 - 89% of the time/requires cueing 10 - 24% of the time Memory Memory: 3-Recognizes or recalls 50 - 74% of the time/requires cueing 25 - 49% of the time FIM - Eating Eating Activity: 5: Supervision/cues  Pain Pain Assessment Pain Assessment: No/denies pain  Therapy/Group: Individual Therapy  Shaunte Weissinger, Selinda Orion 03/01/2014, 3:28 PM

## 2014-03-01 NOTE — Progress Notes (Signed)
Occupational Therapy Session Note  Patient Details  Name: Gregory Wiley MRN: [id number] Date of Birth: 07-Jul-1950  Today's Date: 03/01/2014 OT Individual Time: 1100-1200 OT Individual Time Calculation (min): 60 min    Short Term Goals: Week 2:  OT Short Term Goal 1 (Week 2): Pt will perform toilet transfer sliding board or squat pivot with total assist +2 (pt 25%) to bariatric drop arm commode.  OT Short Term Goal 2 (Week 2): Pt will roll to the right side in bed during LB selfcare with max assist and mod instructional cueing for sequencing. OT Short Term Goal 3 (Week 2): Pt will perform UB dressing with min assist and mod instructional cueing.   OT Short Term Goal 4 (Week 2): Pt will be able to perform LB bathing sit to stand with total assist +2 (pt 30%).   OT Short Term Goal 5 (Week 2): Pt will locate bathing and grooming items left of midline with no more than mod questioning cues. .  Skilled Therapeutic Interventions/Progress Updates:    1:1 self care retraining at sink level. Pt received from w/c. Wife present for session.  Progressed to bathing and dressing at sink level today. Removed left arm rest to promote left UE awareness, management of left UE and opportunities for weightbearing. Focused on trunk control and upright posture sitting unsupported at [place] during UB bathing and dressing. Provided mod cuing for left UE awareness and total A for management of UE. Pt with good carry over of sequence for doffing dirty and donning clean shirt. Pt achieved 2 sit to stands (for clothing management and peri care) with max A +2 with sink for right UE support. Pt with c/o right LE discomfort; however pt taking all weight on his right side. Provided support to the right thigh and knee for patient to shift his weight evenly  over limb and for a sense of security. Pt with improvement with forward weight shifts during LB dressing and in prep for sit to stand. Pt able to wash lower LE and thread pants  over left UE with propping LE on chair in font of him. Pt demonstrated increase intellectual awareness; naming 3 new things he was participated in. Pt left in w/c with safety belt and wife present.    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  Restrictions Weight Bearing Restrictions: No Pain:  c/o left ankle pain with weight bearing and dosiflexion.  Unable to take weight through foot in standing  See FIM for current functional status  Therapy/Group: Individual Therapy  [name] [facility] 03/01/2014, 2:05 PM

## 2014-03-01 NOTE — Progress Notes (Signed)
Physical Therapy Session Note  Patient Details  Name: Gregory Wiley MRN: [id number] Date of Birth: [birth year]  Today's Date: 03/01/2014 PT Individual Time: 0930-1030 PT Individual Time Calculation (min): 60 min   Short Term Goals: Week 2:  PT Short Term Goal 1 (Week 2): Pt will perform rolling to L with min A and to R with total A x 1.  PT Short Term Goal 2 (Week 2): Pt will perform sit <> supine with total A x 1 with use of hospital bed functions. PT Short Term Goal 3 (Week 2): Pt will perform dynamic standing balance x 1 min with +2 assist. PT Short Term Goal 4 (Week 2): Patient will perform bed <> w/c transfer with mod A x 2.   Skilled Therapeutic Interventions/Progress Updates:   Pt received semi reclined in bed, agreeable to therapy. Pt noted to be setup for urinal but reports being unable to void. Pt performed rolling to R and L with use of rail and chuck pad with +2 assist to doff/don brief and shorts. With [facility] elevated and use of rail, pt transferred supine > sit with total A x 1. Pt worked on dynamic sitting balance edge of bed to check cell phone and brush hair with supervision. Pt performed sit <> stand with use of Sara Plus without sling and foot plate removed from edge of bed with +1 assist with focus on initiation and engaging glutes for proximal trunk control. Pt able to maintain standing x 1 min, x <30 sec due to L ankle pain, and x 2 min after L foot repositioned. On third stand, pt reporting plantar fascitis in R foot with c/o pain 4/10, improved to 1/10 with seated rest. Pt performed stand pivot transfer with Clarise Cruz Plus with foot plate on and +2 assist with wife guiding lift. Pt repositioned hips back in chair with verbal cues and left sitting in w/c with quick release belt on and wife in room.    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

## 2014-03-02 ENCOUNTER — Ambulatory Visit (HOSPITAL_COMMUNITY): Payer: 59 | Admitting: Speech Pathology

## 2014-03-02 ENCOUNTER — Inpatient Hospital Stay (HOSPITAL_COMMUNITY): Payer: 59 | Admitting: Occupational Therapy

## 2014-03-02 ENCOUNTER — Encounter (HOSPITAL_COMMUNITY): Payer: 59 | Admitting: Occupational Therapy

## 2014-03-02 ENCOUNTER — Inpatient Hospital Stay (HOSPITAL_COMMUNITY): Payer: 59 | Admitting: Physical Therapy

## 2014-03-02 DIAGNOSIS — R1314 Dysphagia, pharyngoesophageal phase: Secondary | ICD-10-CM | POA: Diagnosis present

## 2014-03-02 LAB — BASIC METABOLIC PANEL
Anion gap: 11 (ref 5–15)
BUN: 15 mg/dL (ref 6–23)
CO2: 27 mEq/L (ref 19–32)
Calcium: 9 mg/dL (ref 8.4–10.5)
Chloride: 99 mEq/L (ref 96–112)
Creatinine, Ser: 0.87 mg/dL (ref 0.50–1.35)
GFR calc Af Amer: >90 mL/min (ref >90)
GFR calc non Af Amer: 89 mL/min — ABNORMAL LOW (ref >90)
Glucose, Bld: 110 mg/dL — ABNORMAL HIGH (ref 70–99)
Potassium: 4.2 mEq/L (ref 3.7–5.3)
Sodium: 137 mEq/L (ref 137–147)

## 2014-03-02 LAB — URINE MICROSCOPIC-ADD ON

## 2014-03-02 LAB — URINALYSIS, ROUTINE W REFLEX MICROSCOPIC
Bilirubin Urine: NEGATIVE
Glucose, UA: NEGATIVE mg/dL
Hgb urine dipstick: NEGATIVE
Ketones, ur: NEGATIVE mg/dL
Nitrite: NEGATIVE
Protein, ur: NEGATIVE mg/dL
Specific Gravity, Urine: 1.024 (ref 1.005–1.030)
Urobilinogen, UA: 0.2 mg/dL (ref 0.0–1.0)
pH: 7.5 (ref 5.0–8.0)

## 2014-03-02 MED ORDER — TRAMADOL HCL 50 MG PO TABS
100.0000 mg | ORAL_TABLET | Freq: Two times a day (BID) | ORAL | Status: DC
  Administered 2014-03-03 – 2014-03-07 (×8): 100 mg via ORAL
  Filled 2014-03-02 (×10): qty 2

## 2014-03-02 NOTE — Progress Notes (Signed)
Placed patient on CPAP auto mode [id number].  Patient is tolerating well at this time.

## 2014-03-02 NOTE — Progress Notes (Signed)
Physical Therapy Session Note  Patient Details  Name: Gregory Wiley MRN: [id number] Date of Birth: 1950-10-14  Today's Date: 03/02/2014 PT Individual Time: 3875-6433 PT Individual Time Calculation (min): 65 min   Short Term Goals: Week 2:  PT Short Term Goal 1 (Week 2): Pt will perform rolling to L with min A and to R with total A x 1.  PT Short Term Goal 2 (Week 2): Pt will perform sit <> supine with total A x 1 with use of hospital bed functions. PT Short Term Goal 3 (Week 2): Pt will perform dynamic standing balance x 1 min with +2 assist. PT Short Term Goal 4 (Week 2): Patient will perform bed <> w/c transfer with mod A x 2.   Skilled Therapeutic Interventions/Progress Updates:   Pt received semi reclined in bed, finishing breakfast with nurse tech providing supervision. Performed L ankle PROM (increased pain) in preparation for standing and transfers. RN notified for pain medication administration. With HOB flat and use of rail  pt transferred supine > sit with +2 assist and verbal cues for technique. Seated EOB, patient donned pullover shirt using hemi technique with LUE supported to decrease L shoulder pain. Patient performed stand pivot transfer for first time via [place] with sling in place from bed > w/c with +2 assist and cues for weight shifting, sequencing, and total A to position LLE. Pt propelled w/c using RLE only with shoes donned due to being unable to reach wheel rim in tilt in space w/c x 100 ft with mod-max A. Recreational therapist joined session for co-treat with focus on dynamic sitting balance, weight shifting, and attending to L with reaching outside BOS in all directions at supervision-min guard. Pt with c/o low back pain when reaching toward floor to work on anterior weight shift and hip clearance. Pt repositioned hips back in w/c using lateral leans with +2 assist and left sitting in w/c with quick release belt on and call bell within reach, daughter present for  session.   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

## 2014-03-02 NOTE — Progress Notes (Signed)
Orthopedic Tech Progress Note Patient Details:  Gregory Wiley 1950-10-21 [id number]  Patient ID: Stann Mainland, male   DOB: Feb 14, 1951, 63 y.o.   MRN: [id number] called in advanced brace order; spoke with Jane Canary, Shalawn Wynder 03/02/2014, 3:47 PM

## 2014-03-02 NOTE — Progress Notes (Signed)
Subjective/Complaints: Off IVF , BMET normal No further caths  Review of Systems - Negative except above    Poor awareness of deficits secondary to R MCA lg infarct Objective: Vital Signs: Blood pressure 105/64, pulse 90, temperature 97.6 F (36.4 C), temperature source Oral, resp. rate 17, height $RemoveBe'6\' 1"'FWkXocboE$  (1.854 m), weight 157.398 kg (347 lb), SpO2 95 %. No results found. Results for orders placed or performed during the hospital encounter of 02/15/14 (from the past 72 hour(s))  Basic metabolic panel     Status: Abnormal   Collection Time: 03/02/14  6:55 AM  Result Value Ref Range   Sodium 137 137 - 147 mEq/L   Potassium 4.2 3.7 - 5.3 mEq/L   Chloride 99 96 - 112 mEq/L   CO2 27 19 - 32 mEq/L   Glucose, Bld 110 (H) 70 - 99 mg/dL   BUN 15 6 - 23 mg/dL   Creatinine, Ser 0.87 0.50 - 1.35 mg/dL   Calcium 9.0 8.4 - 10.5 mg/dL   GFR calc non Af Amer 89 (L) >90 mL/min   GFR calc Af Amer >90 >90 mL/min    Comment: (NOTE) The eGFR has been calculated using the CKD EPI equation. This calculation has not been validated in all clinical situations. eGFR's persistently <90 mL/min signify possible Chronic Kidney Disease.    Anion gap 11 5 - 15     HEENT: normal Cardio: RRR and no murmur Resp: CTA B/L and unlabored GI: BS positive and NT, ND Extremity:  Pulses positive and No Edema Skin:   Intact Neuro: Alert/Oriented, Cranial Nerve Abnormalities Left central 7, Normal Sensory, Abnormal Motor 0/5 LUE and LLE persistent. Does sense pain. , Dysarthric and left Inattention, right gaze preference better MAS 2-3 Left elbow flexors  Musc/Skel:  Other no pain with Left shoulder ROM MSK- Occ pain with ankle DF/PF Gen NAD   Assessment/Plan: 1. 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 Physiatrist is providing close  team supervision and 24 hour management of active medical problems listed below. Physiatrist and rehab team continue to assess barriers to discharge/monitor patient progress toward functional and medical goals.  FIM: FIM - Bathing Bathing Steps Patient Completed: Chest, Abdomen, Right upper leg, Left upper leg Bathing: 1: Two helpers  FIM - Upper Body Dressing/Undressing Upper body dressing/undressing steps patient completed: Thread/unthread right sleeve of pullover shirt/dresss, Thread/unthread left sleeve of pullover shirt/dress, Put head through opening of pull over shirt/dress Upper body dressing/undressing: 4: Min-Patient completed 75 plus % of tasks FIM - Lower Body Dressing/Undressing Lower body dressing/undressing steps patient completed: Thread/unthread right underwear leg Lower body dressing/undressing: 1: Two helpers  FIM - Toileting Toileting: 0: Activity did not occur  FIM - Air cabin crew Transfers: 0-Activity did not occur  FIM - Control and instrumentation engineer Devices: HOB elevated, Bed rails Bed/Chair Transfer: 1: Supine > Sit: Total A (helper does all/Pt. < 25%), 1: Two helpers  FIM - Locomotion: Wheelchair Distance: 100 Locomotion: Wheelchair: 0: Activity did not occur FIM - Locomotion: Ambulation Ambulation/Gait Assistance: Not tested (comment) Locomotion: Ambulation: 0: Activity did not occur  Comprehension Comprehension Mode: Auditory Comprehension: 5-Follows basic conversation/direction: With extra time/assistive device  Expression Expression Mode: Verbal Expression: 5-Expresses basic 90% of the time/requires cueing < 10% of the time.  Social Interaction Social Interaction: 5-Interacts appropriately 90% of the time - Needs monitoring or  encouragement for participation or interaction.  Problem Solving Problem Solving: 4-Solves basic 75 - 89% of the time/requires cueing 10 - 24% of the time  Memory Memory: 3-Recognizes or  recalls 50 - 74% of the time/requires cueing 25 - 49% of the time  Medical Problem List and Plan: 1. Functional deficits secondary to Right MCA infarct with left hemiparesis, dysarthria, dysphagia, and visual spatial deficits 2. DVT Prophylaxis/Anticoagulation: Pharmaceutical: Lovenox 3. New onset Headaches/Pain Management: Continue tylenol prn for now. Left ankle pain is gout  , start steroid burst and taper. Mood: Poor frustration tolerance noted. LCSW to follow for evaluation and support. Monitor CBG on prednisone 5. Neuropsych: This patient is not capable of making decisions on his own behalf. 6. Skin/Wound Care: Routine pressure relief measures.  7. Fluids/Electrolytes/Nutrition: Monitor I/O.Hold IVF, recheck BMET on 12/16 8. HTN: Will monitor every 8 hours. Currently controlled of medications (and to avoid hypotension). will resume medications as indicated. flomax may lower BP    10 OSA: Continue CPAP (has home machine) whenever asleep. poor compliance, discussed increase CVA risk, ? desat at noc 11. Recent esophagitis/duodenitis due to gastric ulcer: Protonix bid to help promote healing 12. CAD: Has chest wall pain due to poorly Healed sternum. Continue ASA, plavix, lipitor--off imdur and coreg  13.  Left ankle pain with ROM and wt bearing, non progressive, exam with minimal swelling, no erythema, not consistent, xray with mod OA, tramadol is helping but makes him drowsy, avoid NSAID secondary to recent CVA  LOS (Days) 15 A FACE TO FACE EVALUATION WAS PERFORMED  KIRSTEINS,ANDREW E 03/02/2014, 7:41 AM

## 2014-03-02 NOTE — Progress Notes (Signed)
Recreational Therapy Session Note  Patient Details  Name: Gregory Wiley MRN: [id number] Date of Birth: [birth year] Today's Date: 03/02/2014  Pain: c/o pain throughout with movement, RN aware, premedicated, repositioning offered with some relief Skilled Therapeutic Interventions/Progress Updates: Session focused on activity tolerance, dynamic sitting balance while reaching outside BOS in all directions with close supervision-contact guard assist, min verbal cues for encouragement.  Therapy/Group: Co-Treatment   Statia Burdick 03/02/2014, 9:55 AM

## 2014-03-02 NOTE — Patient Care Conference (Signed)
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 Dysphagia, pharyngoesophageal phase 03/02/2014  . Arthralgia of left ankle 02/24/2014  . Hemiplegia affecting left dominant side 02/16/2014  . 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 Benign neoplasm of colon 02/10/2013  . Special screening for malignant neoplasms, colon 02/10/2013  . OSA on CPAP 01/06/2013  . S/P angioplasty with DES to CFX 07/01/11 07/02/2011  . Sleep apnea, "cant afford C-pap" 07/02/2011  . CAD, RCA PCI '09, 10/11, CABG X 4 5/12 06/30/2011  . Hypertension, B/P has been low this admission 06/30/2011  . Hyperlipidemia 06/30/2011  . Morbid obesity 06/30/2011  . NSTEMI, 06/29/11 06/30/2011  . 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 Ischemic cardiomyopathy, EF 35-40 2D May 2012 06/30/2011  . CHF, acute, mild 06/30/2011    Expected Discharge Date:    Team Members Present: Physician leading conference: Dr. Alysia Penna Social Worker Present: Ovidio Kin, LCSW Nurse Present: Heather Roberts, RN PT Present: Raylene Everts, PT;Emily Parcell, PT;Derek Laughter Jari Favre, PT OT Present: Benay Pillow, Maryella Shivers, OT SLP Present: Windell Moulding, SLP PPS Coordinator present : Daiva Nakayama, RN, CRRN     Current Status/Progress Goal Weekly Team Focus  Medical   off IVF, spasticity mild  D/C to  SNF reduced burden of care  increase appetite   Bowel/Bladder   Incontinent of bowel and bladder at times; briefs in use; assist with urinal.  In and out cath PRN, primarily at night.  Manage bowel and bladder wtih mod assist.  Timed toileting q2-3 hours during the day.  Continue monitoring PVRs.   Swallow/Nutrition/ Hydration   Dys 2 textures, upgraded to thin liquids with full supervision   least restrictive PO intake with Min assist   Trials of advanced consistencies, continue to follow up for diet tolerance    ADL's   began bathing at sink level - sit to stand with max A +2. continues to have pain left ankle and difficulty tolerating weight bearing. Still presents with trace left shoulder activity.  UB B/D with mod-max A LB max A +2 for sit to stand.  Transfers slide board +2 for safety with max A with extra time  Max assist for transfers, toileting, and LB selfcare sit to stand.  Min assist to supervision for UB selfcare  self care at sink level, sit to stand, standing balance, neuro muscular reeducation, pt/ fam education, left side awareness, postural control   Mobility   +2 for slide board t/f and initiated lateral scoot t/f (+3 at times to stabilize w/c), total A x 1 with use of bed functions for sit <> stand, +2 for rolling, able to stand using Clarise Cruz Plus up to 2 min with +1 assist but need +2 for stand pivot transfer on Sara Plus  max A x 1 except S for w/c mobility  slide board > lateral  scoot transfers, L NMR, sit <> stand, standing tolerance limited by L ankle pain, attention, w/c seating and positioning, pt/family education   Communication   min-mod assist   min assist   continue to improve vocal intensity and awareness of errors    Safety/Cognition/ Behavioral Observations  mod assist   min assist   continue to improve left attention, sustained attention, and basic problem solving    Pain   Pain to left ankle secondary to gout; denies pain at rest; tylenol or ultram PRN.  </=3   Assess pain qshift and PRN.   Skin   Healed incision to left chest from loop recorder.  No new skin breakdown.  Assess skin qshift.      *See Care Plan and progress notes for long and short-term goals.  Barriers to Discharge: heavy assist, intermittent pain    Possible Resolutions to Barriers:  Cont rehab    Discharge Planning/Teaching Needs:  Will be NHP once feels medically stable and MD feels ready for transfer      Team Discussion:  Making progress slowly-ankle and knee pain limits him in therapies. PRAFO for night time. MBS re-do this week. Bladder working now and [employer].Poor po intake. ONe more week then ready for NHP  Revisions to Treatment Plan:  One more week then ready for NH   Continued Need for Acute Rehabilitation Level of Care: The patient requires daily medical management by a physician with specialized training in physical medicine and rehabilitation for the following conditions: Daily direction of a multidisciplinary physical rehabilitation program to ensure safe treatment while eliciting the highest outcome that is of practical value to the patient.: Yes Daily medical management of patient stability for increased activity during participation in an intensive rehabilitation regime.: Yes Daily analysis of laboratory values and/or radiology reports with any subsequent need for medication adjustment of medical intervention for : Neurological problems;Pulmonary problems;Other  Gregory Wiley 03/02/2014, 2:46 PM

## 2014-03-02 NOTE — Progress Notes (Signed)
Social Work Patient ID: Stann Mainland, male   DOB: [birth year], 63 y.o.   MRN: [id number] Met with wife to discuss team conference progression toward his goals and MD feels he would be ready to go to a NH sometime next week. He feels three weeks here then transition to NH.  Wife reports he is still being I & O cathed, which concerns her. She would like worker to pursue [employer] Which is closer to their home.  She is here daily and see's the progress and what limits him.

## 2014-03-02 NOTE — Progress Notes (Addendum)
Occupational Therapy Session Note  Patient Details  Name: Gregory Wiley MRN: [id number] Date of Birth: 04/24/1950  Today's Date: 03/02/2014 OT Individual Time: 2355-7322 OT Individual Time Calculation (min): 45 min    Short Term Goals: Week 2:  OT Short Term Goal 1 (Week 2): Pt will perform toilet transfer sliding board or squat pivot with total assist +2 (pt 25%) to bariatric drop arm commode.  OT Short Term Goal 2 (Week 2): Pt will roll to the right side in bed during LB selfcare with max assist and mod instructional cueing for sequencing. OT Short Term Goal 3 (Week 2): Pt will perform UB dressing with min assist and mod instructional cueing.   OT Short Term Goal 4 (Week 2): Pt will be able to perform LB bathing sit to stand with total assist +2 (pt 30%).   OT Short Term Goal 5 (Week 2): Pt will locate bathing and grooming items left of midline with no more than mod questioning cues. .  Skilled Therapeutic Interventions/Progress Updates:    Session 1:  Pt performed bathing and dressing sit to stand at the sink.  He was able to perform UB bathing with min assist and mod instructional cueing sitting EOC.  Total assist for integration of the LUE into the bathing task.  Pt was able to scan to the left of the sink to locate the soap with min questioning cueing during session.  Performed sit to stand 4 intervals during LB bathing and dressing with total assist +2 (pt 30%).  He was only able to maintain standing for intervals of 15-30 seconds with limitations secondary to increased left ankle and bilateral knee pain.  Min instructional cueing to sequence donning pullover shirt as he attempts to thread the left arm up through the sleeve and out the bottom of the shirt instead of going up through the bottom and out the sleeve.  Pt making better progress and now has progressed to sit to stand for LB selfcare tasks.    Session 2:  (14:30-15:00)  Pt transferred back to the bed from the wheelchair with  total assist +2 (pt 25%).  He worked on sit to stand from the elevated EOB with max assist from therapist.  Emphasis on forward trunk flexion for sit to stand with max demonstrational cueing as pt exhibits decreased flexion with all transitional movements.  He was able to maintain standing for 15-30 seconds but with decreased weightbearing over the LLE and weightshifted to the right.  Pt still with decreased ability to tolerate full weightbearing over the LLE.  He was able to move the RLE a couple of inches in standing to the left but could not reproduce.  Transitioned to supine with total assist +2 (pt 25%).  Educated wife and pt on rolling to the left side and positioning on the left side.  Discussed the need for +2 assistance to roll to the right side at this time.        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

## 2014-03-02 NOTE — Progress Notes (Signed)
NUTRITION FOLLOW UP  DOCUMENTATION CODES Per approved criteria  -Morbid Obesity   INTERVENTION: Continue Ensure Pudding po TID, each supplement provides 170 kcal and 4 grams of protein.  Continue 30 ml Prostat po BID, each supplement provides 100 kcal and 15 grams of protein.   Discontinue Glucerna Shake.  Recommend obtaining new weight to accurately assess weight trends.  Encourage adequate PO intake.  NUTRITION DIAGNOSIS: Inadequate oral intake related to dysphagia, s/p stroke as evidenced by varied meal completion of 0-100%; ongoing  Goal: Pt to meet >/= 90% of their estimated nutrition needs; not met  Monitor:  PO intake, weight trends, labs, I/O's  63 y.o. 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 MRI brain revealed acute infarct right MCA territory involving the right temporal lobe, insula, and basal ganglia and abrupt occlusion right M1 segment question due to embolus.  Pt reports a decreased appetite. Meal completion has been 25-50%. Pt also reports he feels his meals are arriving too close together as he usually eats a late lunch and dinner, thus is not very hungry at times. Pt reports he has been taking his supplements, however reports does not like Glucerna Shake. Will discontinue. Pt was encouraged to eat his food at meals and to take his supplements.     Height: Ht Readings from Last 1 Encounters:  02/15/14 6' 1" (1.854 m)    Weight: Wt Readings from Last 1 Encounters:  02/15/14 347 lb (157.398 kg)    BMI:  Body mass index is 45.79 kg/(m^2). Morbid obesity  Re-Estimated Nutritional Needs: Kcal: 2100-2400 Protein: 120-150 grams Fluid: 2.1 - 2.4 L/day  Skin: Incision left chest, non-pitting LE edema  Diet Order: DIET DYS 2     Intake/Output Summary (Last 24 hours) at 03/02/14 1230 Last data filed at 03/02/14 1000  Gross per 24 hour  Intake    835 ml  Output   1325 ml  Net   -490 ml    Last BM: 12/14  Labs:   Recent Labs Lab 03/02/14 0655  NA 137  K 4.2  CL 99  CO2 27  BUN 15  CREATININE 0.87  CALCIUM 9.0  GLUCOSE 110*    CBG (last 3)  No results for input(s): GLUCAP in the last 72 hours.  Scheduled Meds: . aspirin EC  81 mg Oral Daily  . atorvastatin  80 mg Oral q1800  . clopidogrel  75 mg Oral Daily  . enoxaparin (LOVENOX) injection  80 mg Subcutaneous Q24H  . feeding supplement (ENSURE)  1 Container Oral TID BM  . feeding supplement (GLUCERNA SHAKE)  237 mL Oral Q2000  . feeding supplement (PRO-STAT SUGAR FREE 64)  30 mL Oral BID BM  . fluticasone  2 spray Each Nare Daily  . gabapentin  100 mg Oral TID  . methylphenidate  5 mg Oral BID WC  . pantoprazole sodium  40 mg Per Tube BID  . sennosides  5 mL Oral BID  . tamsulosin  0.4 mg Oral QPC supper  . [START ON 03/03/2014] traMADol  100 mg Oral BID    Continuous Infusions:   Past Medical History  Diagnosis Date  . Coronary artery disease     stent, then CABG 07/20/10  . MI (myocardial infarction)     NSTEMI- last cath 06/2011-Stent to LCX-DES, last  nuc 07/30/11 low risk  . Diabetes mellitus     hx of, diet controlled  . Hypertension   . Hyperlipidemia   . Obesity (BMI 30-39.9)   . OSA on CPAP     since July 2013  . H/O cardiomyopathy     ischemic, now last echo 07/30/11, EF 38%W  . Diverticulosis   . Internal hemorrhoids   . Tubular adenoma of colon   . Plantar fasciitis of left foot   . Stroke     Past Surgical History  Procedure Laterality Date  . Coronary artery bypass graft  07/20/2010     LIMA-LAD; VG-ACUTE MARG of RCA; Seq VG-distal RCA & then pda  . Coronary angioplasty with stent placement  07/01/2011    DES-Resolute to native LCX  . Coronary angioplasty with stent placement  12/01/2007    COMPLEX 5 LESION  PCI INCLUDING CUTTING BALLOON AND 4 CYPHER DESs  . Hernia repair  2006  . Knee surgery Right   . Hand surgery      to take glass out  . Colonoscopy N/A 02/10/2013    Procedure: COLONOSCOPY;  Surgeon: Ladene Artist, MD;  Location: [facility] ENDOSCOPY;  Service: Endoscopy;  Laterality: N/A;  . Retinal detachment surgery    . Loop recorder implant  02/15/2014    MDT LINQ implanted by Dr Rayann Heman for cryptogenic stroke  . Tee without cardioversion N/A 02/15/2014    Procedure: TRANSESOPHAGEAL ECHOCARDIOGRAM (TEE);  Surgeon: Josue Hector, MD;  Location: [facility] ENDOSCOPY;  Service: Cardiovascular;  Laterality: N/A;  . Left heart catheterization with coronary/graft angiogram  07/01/2011    Procedure: LEFT HEART CATHETERIZATION WITH Beatrix Fetters;  Surgeon: Sanda Klein, MD;  Location: [facility] CATH LAB;  Service: Cardiovascular;;  . Percutaneous coronary stent intervention (pci-s) Right 07/01/2011    Procedure: PERCUTANEOUS CORONARY STENT INTERVENTION (PCI-S);  Surgeon: Sanda Klein, MD;  Location: [facility] CATH LAB;  Service: Cardiovascular;  Laterality: Right;    Kallie Locks, MS, RD, LDN Pager # [phone] After hours/ weekend pager # [phone]

## 2014-03-02 NOTE — Progress Notes (Signed)
Speech Language Pathology Daily Session Note  Patient Details  Name: Gregory Wiley MRN: [id number] Date of Birth: August 28, 1950  Today's Date: 03/02/2014 SLP Individual Time: 1300-1400 SLP Individual Time Calculation (min): 60 min  Short Term Goals: Week 2: SLP Short Term Goal 1 (Week 2): Patien will consume Dys. 2 textures and nectar-thick liquids via straw with Mod cues for recall and use of safe swallow strategies to minimize overt s/s of aspiration SLP Short Term Goal 2 (Week 2): Patient will recall and scan to the left of midline to locate items as needed, during basic functional tasks with Mod mulitmodal cues  SLP Short Term Goal 3 (Week 2): Patient will produce intelligible speech at the phrase-sentence level of vebral expression with Min vebral cues to utilize speech intelligibilty strategies SLP Short Term Goal 4 (Week 2): Patient will label 2 physical and 2 cognitive deficits with Mod question cues SLP Short Term Goal 5 (Week 2): Patient will sustain attention to basic tasks for 1 minute with Mod verbal cues for redirections  Skilled Therapeutic Interventions:  Pt was seen for skilled ST targeting goals for dysphagia and cognition.  Upon arrival, pt was seated upright in wheelchair, awake, alert, and agreeable to participate in ST with min encouragement.  SLP completed skilled observations with presentations of pt's currently prescribed diet.  Pt exhibited no overt s/s of aspiration with solid or liquid consistencies and demonstrated efficient mastication and adequate oral clearance of residuals.  Recommend that pt continue on dys 2 solids with thin liquids and full supervision for use of swallowing precautions.  SLP also facilitated the session with a structured problem solving activity targeting planning, thought organization, and mental flexibility.  Pt required overall mod assist verbal cues to complete the abovementioned task.  Additionally, SLP facilitated the session with min-mod cues  for numerical reasoning and error awareness to complete functional math calculations.  Continue per current plan of care.   FIM:  Comprehension Comprehension Mode: Auditory Comprehension: 5-Follows basic conversation/direction: With extra time/assistive device Expression Expression Mode: Verbal Expression: 5-Expresses basic 90% of the time/requires cueing < 10% of the time. Social Interaction Social Interaction: 5-Interacts appropriately 90% of the time - Needs monitoring or encouragement for participation or interaction. Problem Solving Problem Solving: 4-Solves basic 75 - 89% of the time/requires cueing 10 - 24% of the time Memory Memory: 3-Recognizes or recalls 50 - 74% of the time/requires cueing 25 - 49% of the time FIM - Eating Eating Activity: 5: Set-up assist for open containers;5: Supervision/cues  Pain Pain Assessment Pain Assessment: No/denies pain   Therapy/Group: Individual Therapy  Lianne Carreto, Selinda Orion 03/02/2014, 4:22 PM

## 2014-03-03 ENCOUNTER — Inpatient Hospital Stay (HOSPITAL_COMMUNITY): Payer: 59 | Admitting: Speech Pathology

## 2014-03-03 ENCOUNTER — Inpatient Hospital Stay (HOSPITAL_COMMUNITY): Payer: 59 | Admitting: Occupational Therapy

## 2014-03-03 ENCOUNTER — Encounter (HOSPITAL_COMMUNITY): Payer: 59

## 2014-03-03 MED ORDER — ACETAMINOPHEN 500 MG PO TABS
1000.0000 mg | ORAL_TABLET | Freq: Four times a day (QID) | ORAL | Status: DC | PRN
Start: 2014-03-03 — End: 2014-03-08
  Administered 2014-03-04: 1000 mg via ORAL
  Filled 2014-03-03: qty 2

## 2014-03-03 NOTE — Progress Notes (Signed)
Subjective/Complaints: No urinary c/os No further caths  Review of Systems - Negative except above    Poor awareness of deficits secondary to R MCA lg infarct Objective: Vital Signs: Blood pressure 111/65, pulse 90, temperature 97.6 F (36.4 C), temperature source Oral, resp. rate 18, height $RemoveBe'6\' 1"'kifvwVeKI$  (1.854 m), weight 147.9 kg (326 lb 1 oz), SpO2 93 %. No results found. Results for orders placed or performed during the hospital encounter of 02/15/14 (from the past 72 hour(s))  Basic metabolic panel     Status: Abnormal   Collection Time: 03/02/14  6:55 AM  Result Value Ref Range   Sodium 137 137 - 147 mEq/L   Potassium 4.2 3.7 - 5.3 mEq/L   Chloride 99 96 - 112 mEq/L   CO2 27 19 - 32 mEq/L   Glucose, Bld 110 (H) 70 - 99 mg/dL   BUN 15 6 - 23 mg/dL   Creatinine, Ser 0.87 0.50 - 1.35 mg/dL   Calcium 9.0 8.4 - 10.5 mg/dL   GFR calc non Af Amer 89 (L) >90 mL/min   GFR calc Af Amer >90 >90 mL/min    Comment: (NOTE) The eGFR has been calculated using the CKD EPI equation. This calculation has not been validated in all clinical situations. eGFR's persistently <90 mL/min signify possible Chronic Kidney Disease.    Anion gap 11 5 - 15  Urinalysis, Routine w reflex microscopic     Status: Abnormal   Collection Time: 03/02/14  5:22 PM  Result Value Ref Range   Color, Urine YELLOW YELLOW   APPearance HAZY (A) CLEAR   Specific Gravity, Urine 1.024 1.005 - 1.030   pH 7.5 5.0 - 8.0   Glucose, UA NEGATIVE NEGATIVE mg/dL   Hgb urine dipstick NEGATIVE NEGATIVE   Bilirubin Urine NEGATIVE NEGATIVE   Ketones, ur NEGATIVE NEGATIVE mg/dL   Protein, ur NEGATIVE NEGATIVE mg/dL   Urobilinogen, UA 0.2 0.0 - 1.0 mg/dL   Nitrite NEGATIVE NEGATIVE   Leukocytes, UA SMALL (A) NEGATIVE  Urine microscopic-add on     Status: Abnormal   Collection Time: 03/02/14  5:22 PM  Result Value Ref Range   Squamous Epithelial / LPF RARE RARE   WBC, UA 21-50 <3 WBC/hpf   RBC / HPF 0-2 <3 RBC/hpf   Bacteria, UA  MANY (A) RARE     HEENT: normal Cardio: RRR and no murmur Resp: CTA B/L and unlabored GI: BS positive and NT, ND Extremity:  Pulses positive and No Edema Skin:   Intact Neuro: Alert/Oriented, Cranial Nerve Abnormalities Left central 7, Normal Sensory, Abnormal Motor 0/5 LUE and LLE persistent. Does sense pain. , Dysarthric and left Inattention, right gaze preference better MAS 2-3 Left elbow flexors  Musc/Skel:  Other no pain with Left shoulder ROM MSK- Occ pain with ankle DF/PF Gen NAD   Assessment/Plan: 1. 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 Physiatrist is providing close team supervision and 24 hour management of active medical problems listed below. Physiatrist and rehab team continue to assess barriers to discharge/monitor patient progress toward functional and medical goals.  FIM: FIM - Bathing Bathing Steps Patient Completed: Chest, Abdomen, Right upper leg, Left upper leg, Left Arm Bathing: 1: Two helpers  FIM - Upper Body Dressing/Undressing Upper body dressing/undressing steps patient completed: Thread/unthread right sleeve of pullover shirt/dresss, Thread/unthread left sleeve of pullover shirt/dress, Put head through  opening of pull over shirt/dress Upper body dressing/undressing: 4: Min-Patient completed 75 plus % of tasks FIM - Lower Body Dressing/Undressing Lower body dressing/undressing steps patient completed: Thread/unthread right underwear leg Lower body dressing/undressing: 1: Two helpers  FIM - Toileting Toileting: 0: Activity did not occur  FIM - Air cabin crew Transfers: 0-Activity did not occur  FIM - Control and instrumentation engineer Devices: Bed rails Bed/Chair Transfer: 1: Two helpers  FIM - Locomotion: Wheelchair Distance: 100 Locomotion:  Wheelchair: 2: Travels 50 - 149 ft with maximal assistance (Pt: 25 - 49%) FIM - Locomotion: Ambulation Ambulation/Gait Assistance: Not tested (comment) Locomotion: Ambulation: 0: Activity did not occur  Comprehension Comprehension Mode: Auditory Comprehension: 5-Follows basic conversation/direction: With extra time/assistive device  Expression Expression Mode: Verbal Expression: 5-Expresses basic 90% of the time/requires cueing < 10% of the time.  Social Interaction Social Interaction: 5-Interacts appropriately 90% of the time - Needs monitoring or encouragement for participation or interaction.  Problem Solving Problem Solving: 4-Solves basic 75 - 89% of the time/requires cueing 10 - 24% of the time  Memory Memory: 3-Recognizes or recalls 50 - 74% of the time/requires cueing 25 - 49% of the time  Medical Problem List and Plan: 1. Functional deficits secondary to Right MCA infarct with left hemiparesis, dysarthria, dysphagia, and visual spatial deficits 2. DVT Prophylaxis/Anticoagulation: Pharmaceutical: Lovenox 3. New onset Headaches/Pain Management: Continue tylenol prn for now.Left ankle OA, tramadol $RemoveBefo'100mg'RDnKFONniUp$  prior to therapy, avoid NSAIDs secondary to recent CVA, pt doesn't tolerate stronger pain meds from mental status standpoint 5. Neuropsych: This patient is not capable of making decisions on his own behalf. 6. Skin/Wound Care: Routine pressure relief measures.  7. Fluids/Electrolytes/Nutrition: Monitor I/O.Hold IVF, recheck BMET on 12/16 8. HTN: Will monitor every 8 hours. Currently controlled of medications (and to avoid hypotension). will resume medications as indicated. flomax may lower BP    10 OSA: Continue CPAP (has home machine) whenever asleep. poor compliance, discussed increase CVA risk, ? desat at noc 11. Recent esophagitis/duodenitis due to gastric ulcer: Protonix bid to help promote healing 12. CAD: Has chest wall pain due to poorly Healed sternum. Continue  ASA, plavix, lipitor--off imdur and coreg  13.  Left ankle pain with ROM and wt bearing, non progressive, exam with minimal swelling, no erythema, not consistent, xray with mod OA, tramadol is helping but makes him drowsy, avoid NSAID secondary to recent CVA 14.  Probable UTI will tx with empiric abx pending cx LOS (Days) 16 A FACE TO FACE EVALUATION WAS PERFORMED  KIRSTEINS,ANDREW E 03/03/2014, 7:45 AM

## 2014-03-03 NOTE — Progress Notes (Signed)
Physical Therapy Weekly Progress Note  Patient Details  Name: Gregory Wiley MRN: [id number] Date of Birth: 07/11/1950  Beginning of progress report period: February 24, 2014 End of progress report period: March 03, 2014  Today's Date: 03/03/2014 PT Individual Time: 1300-1400 PT Individual Time Calculation (min): 60 min   Patient has met 4 of 4 short term goals. Patient was able to perform stand pivot transfer for first time this week with +2 assist and able to bear weight on L LE in order to step with R LE. Patient continues to be limited with all mobility by L ankle pain. Patient has also progressed from slide board transfer to lateral scoot transfer with +2 assist. Patient can propel w/c using RLE due to being in tilt in space w/c and unable to reach wheel with RUE with max A. Patient is able to perform static standing up to 2 min at a time. Patient performs bed mobility with use of hospital bed functions with +1 assist. Patient continues to require max cues for sequencing and initiating all mobility.   Patient continues to demonstrate the following deficits: L ankle pain, impaired balance, left hemiplegia, muscle weakness, decreased endurance, decreased attention to left, decreased attention, decreased awareness, decreased problem solving, decreased safety awareness, and therefore will continue to benefit from skilled PT intervention to enhance overall performance with activity tolerance, balance, postural control, ability to compensate for deficits, functional use of  left upper extremity and left lower extremity, attention and awareness.  Patient progressing toward long term goals.  Continue plan of care.  PT Short Term Goals Week 2:  PT Short Term Goal 1 (Week 2): Pt will perform rolling to L with min A and to R with total A x 1.  PT Short Term Goal 1 - Progress (Week 2): Met PT Short Term Goal 2 (Week 2): Pt will perform sit <> supine with total A x 1 with use of hospital bed  functions. PT Short Term Goal 2 - Progress (Week 2): Met PT Short Term Goal 3 (Week 2): Pt will perform dynamic standing balance x 1 min with +2 assist. PT Short Term Goal 3 - Progress (Week 2): Met PT Short Term Goal 4 (Week 2): Patient will perform bed <> w/c transfer with mod A x 2.  PT Short Term Goal 4 - Progress (Week 2): Met Week 3:  PT Short Term Goal 1 (Week 3): Pt will perform sit <> supine with total A x 1 with use of hospital bed functions with 25% cues for sequencing.  PT Short Term Goal 2 (Week 3): Patient will initiate transfer bed <> w/c with +2 assist and 25% cues for sequencing.  PT Short Term Goal 3 (Week 3): Patient will maintain static standing x 3 min.  PT Short Term Goal 4 (Week 3): Patient will initiate gait training with +2 assist.   Skilled Therapeutic Interventions/Progress Updates:   Pt received sitting in w/c, reporting pain in buttocks. After tilting chair all the way back, pt reporting some improvement in pain. Therapist donned L air cast and shoes to assist in transfers and standing with increased L ankle pain, see below. Patient performed lateral scoot transfer w/c > mat table with +2 assist and verbal/tactile cues for anterior weight shift, hip clearance, and initiation of transfer. Pt appears much more anxious throughout this session. Pt performed sit <> stand from mat table with min-mod A, x 1 with 3 musketeers style and x 1 with therapist under LUE and  pushing up from mat with R hand. Once standing, patient used stair railing for RUE support. Pt requesting to sit each time, stating, "My left knee is buckling," when asked why. Therapist stabilizing L knee with no evidence of buckling. Pt c/o feeling sick, seated BP 77/46. Pt transitioned to supine on wedge at edge of mat with feet on floor, BP 105/80. In supine on wedge, performed passive B pec stretch. With max coaxing, patient performed stand pivot transfer for first time mat table > w/c to the right with 3  musketeers style and therapist assisting with manual placement of LLE and stabilizing L knee in stance in order to step with RLE. Pt left sitting in w/c to return to room with patient's wife.   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

## 2014-03-03 NOTE — Progress Notes (Signed)
Speech Language Pathology Weekly Progress and Session Note  Patient Details  Name: Gregory Wiley MRN: [id number] Date of Birth: Feb 08, 1951  Beginning of progress report period: February 24, 2014 End of progress report period: March 03, 2014  Today's Date: 03/03/2014 SLP Individual Time: 1430-1530 SLP Individual Time Calculation (min): 60 min  Short Term Goals: Week 2: SLP Short Term Goal 1 (Week 2): Patien will consume Dys. 2 textures and nectar-thick liquids via straw with Mod cues for recall and use of safe swallow strategies to minimize overt s/s of aspiration SLP Short Term Goal 1 - Progress (Week 2): Met SLP Short Term Goal 2 (Week 2): Patient will recall and scan to the left of midline to locate items as needed, during basic functional tasks with Mod mulitmodal cues  SLP Short Term Goal 2 - Progress (Week 2): Met SLP Short Term Goal 3 (Week 2): Patient will produce intelligible speech at the phrase-sentence level of vebral expression with Min vebral cues to utilize speech intelligibilty strategies SLP Short Term Goal 3 - Progress (Week 2): Met SLP Short Term Goal 4 (Week 2): Patient will label 2 physical and 2 cognitive deficits with Mod question cues SLP Short Term Goal 4 - Progress (Week 2): Progressing toward goal SLP Short Term Goal 5 (Week 2): Patient will sustain attention to basic tasks for 1 minute with Mod verbal cues for redirections SLP Short Term Goal 5 - Progress (Week 2): Met    New Short Term Goals: Week 3: SLP Short Term Goal 1 (Week 3): Pt will tolerate presentations of his currently prescribed diet with no overt s/s of aspiration with min cues for use of swallowing precautions SLP Short Term Goal 2 (Week 3): Pt will tolerate trials of advanced consistencies to assess readiness for diet progression with no overt s/s of aspiration and min cues for use of swallowing precautions.  SLP Short Term Goal 3 (Week 3): Patient will recall and scan to the left of midline to  locate items as needed, during basic functional tasks with Min mulitmodal cues  SLP Short Term Goal 4 (Week 3): Patient will produce intelligible speech at the phrase-sentence level of vebral expression with supervision cues to utilize speech intelligibilty strategies SLP Short Term Goal 5 (Week 3): Patient will label 2 physical and 2 cognitive deficits with Mod question cues SLP Short Term Goal 6 (Week 3): Patient will sustain attention to basic tasks for 3-5  minutes with Mod verbal cues for redirections  Weekly Progress Updates:  Pt made functional gains this reporting period and has met 4 out of 5 short term goals.  Pt currently requires overall mod-max assist for cognitive-linguistic tasks and utilizes his recommended swallowing precautions with mod assist to minimize overt s/s of aspiration with dys 2 solids and thin liquids.  Pt would continue to benefit from skilled ST while inpatient in order to maximize functional independence and reduce burden of care upon discharge.     Intensity: Minumum of 1-2 x/day, 30 to 90 minutes Frequency: 5 out of 7 days Duration/Length of Stay: 12/30 to SNF Treatment/Interventions: Cognitive remediation/compensation;Cueing hierarchy;Dysphagia/aspiration precaution training;Environmental controls;Functional tasks;Internal/external aids;Patient/family education;Oral motor exercises;Speech/Language facilitation;Therapeutic Activities   Daily Session  Skilled Therapeutic Interventions: Pt was seen for skilled ST targeting family education and cognitive goals.  Per report, pt's wife has been having difficulty when ordering pt's meals due to dys 2 restrictions.  SLP provided extensive education related to dys 2 textures including foods to avoid and made details notes on  pt's menu to facilitate carryover of education in between therapy sessions.  Furthermore, SLP provided min cues to make appropriate selections from the menu for dinner.  Pt and pt's wife verbalized  understanding of education.  SLP also facilitated the session with a structured task targeting planning, thought organization, and visual scanning to the left.  Pt required mod assist during the abovementioned task.  Goals updated on this date to reflect current progress and plan of care.          FIM:  Comprehension Comprehension Mode: Auditory Comprehension: 5-Follows basic conversation/direction: With extra time/assistive device Expression Expression Mode: Verbal Expression: 5-Expresses basic 90% of the time/requires cueing < 10% of the time. Social Interaction Social Interaction: 4-Interacts appropriately 75 - 89% of the time - Needs redirection for appropriate language or to initiate interaction. Problem Solving Problem Solving: 3-Solves basic 50 - 74% of the time/requires cueing 25 - 49% of the time Memory Memory: 3-Recognizes or recalls 50 - 74% of the time/requires cueing 25 - 49% of the time FIM - Eating Eating Activity: 5: Supervision/cues;5: Set-up assist for open containers General    Pain Pain Assessment Pain Assessment: No/denies pain   Therapy/Group: Individual Therapy  Nedim Oki, Selinda Orion 03/03/2014, 5:27 PM

## 2014-03-03 NOTE — Progress Notes (Signed)
Occupational Therapy Session Note  Patient Details  Name: Gregory Wiley MRN: [id number] Date of Birth: 19-Oct-1950  Today's Date: 03/03/2014 OT Individual Time: 1000-1100 OT Individual Time Calculation (min): 60 min    Short Term Goals: Week 2:  OT Short Term Goal 1 (Week 2): Pt will perform toilet transfer sliding board or squat pivot with total assist +2 (pt 25%) to bariatric drop arm commode.  OT Short Term Goal 2 (Week 2): Pt will roll to the right side in bed during LB selfcare with max assist and mod instructional cueing for sequencing. OT Short Term Goal 3 (Week 2): Pt will perform UB dressing with min assist and mod instructional cueing.   OT Short Term Goal 4 (Week 2): Pt will be able to perform LB bathing sit to stand with total assist +2 (pt 30%).   OT Short Term Goal 5 (Week 2): Pt will locate bathing and grooming items left of midline with no more than mod questioning cues. .  Skilled Therapeutic Interventions/Progress Updates:  Patient sleeping in bed upon arrival with wife present.  Engaged in self care retraining to include sponge bath at sink, groom and dress.  Focused session on bed mobility, safe slide board transfers, w/c positioning, visual attention to left side of body and left visual field, management of LUE, task initiation and completion.  Patient completed slide board transfer bed>w/c with max-mod assist and +2 to stabilize w/c for safety. Patient reports that he does not see improvement since on CIR despite reports from wife and all of the staff.  "That is what they tell me, I don't see it".  Patient stood at the sink 3 times with total - max assist and +2 for safety secondary to patient is unpredictable with his movement.  Patient demonstrated increased pushing with second sit>stand which resulted in need to quickly sit back into w/c.  RLE was repositioned toward left then patient was again only max assist to transition from sit>stand.  Patient sat EOC for ~70% of the  session to address trunk control, core strengthening and sitting balance.  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

## 2014-03-04 ENCOUNTER — Inpatient Hospital Stay (HOSPITAL_COMMUNITY): Payer: 59 | Admitting: Occupational Therapy

## 2014-03-04 ENCOUNTER — Encounter (HOSPITAL_COMMUNITY): Payer: 59 | Admitting: Occupational Therapy

## 2014-03-04 ENCOUNTER — Inpatient Hospital Stay (HOSPITAL_COMMUNITY): Payer: 59 | Admitting: Speech Pathology

## 2014-03-04 LAB — BASIC METABOLIC PANEL
Anion gap: 14 (ref 5–15)
BUN: 20 mg/dL (ref 6–23)
CO2: 26 mEq/L (ref 19–32)
Calcium: 9.2 mg/dL (ref 8.4–10.5)
Chloride: 96 mEq/L (ref 96–112)
Creatinine, Ser: 0.98 mg/dL (ref 0.50–1.35)
GFR calc Af Amer: >90 mL/min (ref >90)
GFR calc non Af Amer: 85 mL/min — ABNORMAL LOW (ref >90)
Glucose, Bld: 128 mg/dL — ABNORMAL HIGH (ref 70–99)
Potassium: 3.8 mEq/L (ref 3.7–5.3)
Sodium: 136 mEq/L — ABNORMAL LOW (ref 137–147)

## 2014-03-04 LAB — CBC
HCT: 37.1 % — ABNORMAL LOW (ref 39.0–52.0)
Hemoglobin: 11.7 g/dL — ABNORMAL LOW (ref 13.0–17.0)
MCH: 27 pg (ref 26.0–34.0)
MCHC: 31.5 g/dL (ref 30.0–36.0)
MCV: 85.7 fL (ref 78.0–100.0)
Platelets: 221 10*3/uL (ref 150–400)
RBC: 4.33 MIL/uL (ref 4.22–5.81)
RDW: 13.9 % (ref 11.5–15.5)
WBC: 6.9 10*3/uL (ref 4.0–10.5)

## 2014-03-04 MED ORDER — PANTOPRAZOLE SODIUM 40 MG PO TBEC
40.0000 mg | DELAYED_RELEASE_TABLET | Freq: Two times a day (BID) | ORAL | Status: DC
  Administered 2014-03-04 – 2014-03-15 (×23): 40 mg via ORAL
  Filled 2014-03-04 (×23): qty 1

## 2014-03-04 MED ORDER — MECLIZINE HCL 25 MG PO TABS
25.0000 mg | ORAL_TABLET | Freq: Three times a day (TID) | ORAL | Status: DC | PRN
  Filled 2014-03-04: qty 1

## 2014-03-04 MED ORDER — AMOXICILLIN 250 MG PO CAPS
250.0000 mg | ORAL_CAPSULE | Freq: Three times a day (TID) | ORAL | Status: DC
  Administered 2014-03-04 – 2014-03-06 (×7): 250 mg via ORAL
  Filled 2014-03-04 (×12): qty 1

## 2014-03-04 NOTE — Progress Notes (Signed)
Speech Language Pathology Daily Session Note  Patient Details  Name: Gregory Wiley MRN: [id number] Date of Birth: Dec 13, 1950  Today's Date: 03/04/2014 SLP Individual Time: 1330-1430 SLP Individual Time Calculation (min): 60 min  Short Term Goals: Week 3: SLP Short Term Goal 1 (Week 3): Pt will tolerate presentations of his currently prescribed diet with no overt s/s of aspiration with min cues for use of swallowing precautions SLP Short Term Goal 2 (Week 3): Pt will tolerate trials of advanced consistencies to assess readiness for diet progression with no overt s/s of aspiration and min cues for use of swallowing precautions.  SLP Short Term Goal 3 (Week 3): Patient will recall and scan to the left of midline to locate items as needed, during basic functional tasks with Min mulitmodal cues  SLP Short Term Goal 4 (Week 3): Patient will produce intelligible speech at the phrase-sentence level of vebral expression with supervision cues to utilize speech intelligibilty strategies SLP Short Term Goal 5 (Week 3): Patient will label 2 physical and 2 cognitive deficits with Mod question cues SLP Short Term Goal 6 (Week 3): Patient will sustain attention to basic tasks for 3-5  minutes with Mod verbal cues for redirections  Skilled Therapeutic Interventions: Skilled treatment session focused on addressing dysphagia and cognitive goals. Patient required Mod cues to initiate and solve basic problems related to tray set-up, as well as Mod cues to sustain attention to self-feeding for ~2 minute increments.  Patient consumed minimal amount of PO and reported Dys.2 meat being too difficult to masticate, but wife and patient continuing to request Dys.3 textures.  SLP attempted to re-educate patient and wife both of who continue to need reinforcement.  Wife upset about how Dys.2 textures are inconsistent on trays and SLP passes this on to charge nurse.  SLP also facilitated session with a structured categorical  naming task with Min question cues to sustain attention to task for ~3 minute and attend to left during task.  Continue with current plan of care.   FIM:  Comprehension Comprehension Mode: Auditory Comprehension: 5-Follows basic conversation/direction: With extra time/assistive device Expression Expression Mode: Verbal Expression: 5-Expresses basic 90% of the time/requires cueing < 10% of the time. Social Interaction Social Interaction: 4-Interacts appropriately 75 - 89% of the time - Needs redirection for appropriate language or to initiate interaction. Problem Solving Problem Solving: 3-Solves basic 50 - 74% of the time/requires cueing 25 - 49% of the time Memory Memory: 3-Recognizes or recalls 50 - 74% of the time/requires cueing 25 - 49% of the time FIM - Eating Eating Activity: 5: Supervision/cues;5: Set-up assist for open containers;4: Helper checks for pocketed food  Pain Pain Assessment Pain Assessment: No/denies pain  Therapy/Group: Individual Therapy  Carmelia Roller., [place] [phone]  [place] 03/04/2014, 4:40 PM

## 2014-03-04 NOTE — Progress Notes (Signed)
Physical Therapy Session Note  Patient Details  Name: Gregory Wiley MRN: [id number] Date of Birth: 1951/02/21  Today's Date: 03/04/2014 PT Individual Time: 1430-1530 PT Individual Time Calculation (min): 60 min   Short Term Goals: Week 3:  PT Short Term Goal 1 (Week 3): Pt will perform sit <> supine with total A x 1 with use of hospital bed functions with 25% cues for sequencing.  PT Short Term Goal 2 (Week 3): Patient will initiate transfer bed <> w/c with +2 assist and 25% cues for sequencing.  PT Short Term Goal 3 (Week 3): Patient will maintain static standing x 3 min.  PT Short Term Goal 4 (Week 3): Patient will initiate gait training with +2 assist.   Skilled Therapeutic Interventions/Progress Updates:   Pt received sitting in w/c, handoff from SLP. Pt requesting to brush teeth, worked on anterior weight shift in w/c to reach sink in order to brush teeth. Initiated gait training with patient in controlled environment with 3 musketeers technique and +3 assist for w/c follow to ambulate 3-5 ft x 2 with manual facilitation for LLE placement and weight shifting to R and L. Pt requires total cues for encouragement and sequencing. Pt with ability to continue to ambulate but refusing, yelling, "I need to sit down!" and attempting to sit with BLE extended without c/o any pain and no evidence of L knee buckling. When asked why pt needs to sit, he states, "My knee is buckling." Patient performed multiple scoots forward in chair with max cues for initiation and sequencing with mod A. Pt performed sit <> stand from wheelchair with min-mod A x 2. Pt able to take approx 3 steps toward bed in room to perform stand step transfer from w/c but refusing to take last step with RLE to back up to bed for 5 min, yelling, "I can't!" Eventually pt able to take small step in order to sit safely. Patient performed scooting back on bed and to L towards head of bed with mod A and facilitation of anterior weight shift. Pt  transferred sit > supine with +2 assist and left semi reclined in bed with all needs within reach, wife present.   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

## 2014-03-04 NOTE — Progress Notes (Signed)
Subjective/Complaints: Had low systolic in sitting yest Off IVF, po intake looks ok, last BMET nl on 12/16 No further caths  Review of Systems - Negative except above    Poor awareness of deficits secondary to R MCA lg infarct Objective: Vital Signs: Blood pressure 118/70, pulse 85, temperature 97.6 F (36.4 C), temperature source Oral, resp. rate 18, height 6' 1" (1.854 m), weight 147.9 kg (326 lb 1 oz), SpO2 97 %. No results found. Results for orders placed or performed during the hospital encounter of 02/15/14 (from the past 72 hour(s))  Basic metabolic panel     Status: Abnormal   Collection Time: 03/02/14  6:55 AM  Result Value Ref Range   Sodium 137 137 - 147 mEq/L   Potassium 4.2 3.7 - 5.3 mEq/L   Chloride 99 96 - 112 mEq/L   CO2 27 19 - 32 mEq/L   Glucose, Bld 110 (H) 70 - 99 mg/dL   BUN 15 6 - 23 mg/dL   Creatinine, Ser 0.87 0.50 - 1.35 mg/dL   Calcium 9.0 8.4 - 10.5 mg/dL   GFR calc non Af Amer 89 (L) >90 mL/min   GFR calc Af Amer >90 >90 mL/min    Comment: (NOTE) The eGFR has been calculated using the CKD EPI equation. This calculation has not been validated in all clinical situations. eGFR's persistently <90 mL/min signify possible Chronic Kidney Disease.    Anion gap 11 5 - 15  Culture, Urine     Status: None (Preliminary result)   Collection Time: 03/02/14  5:22 PM  Result Value Ref Range   Specimen Description URINE, CATHETERIZED    Special Requests NONE    Culture  Setup Time      03/02/2014 22:41 Performed at [place]      >=100,000 COLONIES/ML Performed at [place] Performed at [facility]    Report Status PENDING   Urinalysis, Routine w reflex microscopic     Status: Abnormal   Collection Time: 03/02/14  5:22 PM  Result Value Ref Range   Color, Urine YELLOW YELLOW   APPearance HAZY (A) CLEAR   Specific Gravity, Urine 1.024 1.005 - 1.030   pH 7.5 5.0 -  8.0   Glucose, UA NEGATIVE NEGATIVE mg/dL   Hgb urine dipstick NEGATIVE NEGATIVE   Bilirubin Urine NEGATIVE NEGATIVE   Ketones, ur NEGATIVE NEGATIVE mg/dL   Protein, ur NEGATIVE NEGATIVE mg/dL   Urobilinogen, UA 0.2 0.0 - 1.0 mg/dL   Nitrite NEGATIVE NEGATIVE   Leukocytes, UA SMALL (A) NEGATIVE  Urine microscopic-add on     Status: Abnormal   Collection Time: 03/02/14  5:22 PM  Result Value Ref Range   Squamous Epithelial / LPF RARE RARE   WBC, UA 21-50 <3 WBC/hpf   RBC / HPF 0-2 <3 RBC/hpf   Bacteria, UA MANY (A) RARE  CBC     Status: Abnormal   Collection Time: 03/04/14  4:45 AM  Result Value Ref Range   WBC 6.9 4.0 - 10.5 K/uL   RBC 4.33 4.22 - 5.81 MIL/uL   Hemoglobin 11.7 (L) 13.0 - 17.0 g/dL   HCT 37.1 (L) 39.0 - 52.0 %   MCV 85.7 78.0 - 100.0 fL   MCH 27.0 26.0 - 34.0 pg   MCHC 31.5 30.0 - 36.0 g/dL   RDW 13.9 11.5 - 15.5 %   Platelets 221 150 -  400 K/uL     HEENT: normal Cardio: RRR and no murmur Resp: CTA B/L and unlabored GI: BS positive and NT, ND Extremity:  Pulses positive and No Edema Skin:   Intact Neuro: Alert/Oriented, Cranial Nerve Abnormalities Left central 7, Normal Sensory, Abnormal Motor 0/5 LUE and LLE persistent. Does sense pain. , Dysarthric and left Inattention, right gaze preference better MAS 2-3 Left elbow flexors  Musc/Skel:  Other no pain with Left shoulder ROM MSK- Occ pain with ankle DF/PF Gen NAD   Assessment/Plan: 1. 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 Physiatrist is providing close team supervision and 24 hour management of active medical problems listed below. Physiatrist and rehab team continue to assess barriers to discharge/monitor patient progress toward functional and medical goals.  FIM: FIM - Bathing Bathing Steps Patient Completed: Chest,  Abdomen, Right upper leg, Left upper leg, Left Arm Bathing: 1: Two helpers  FIM - Upper Body Dressing/Undressing Upper body dressing/undressing steps patient completed: Thread/unthread right sleeve of pullover shirt/dresss, Thread/unthread left sleeve of pullover shirt/dress, Put head through opening of pull over shirt/dress Upper body dressing/undressing: 4: Min-Patient completed 75 plus % of tasks FIM - Lower Body Dressing/Undressing Lower body dressing/undressing steps patient completed: Thread/unthread right underwear leg Lower body dressing/undressing: 1: Two helpers  FIM - Toileting Toileting: 0: Activity did not occur  FIM - Air cabin crew Transfers: 0-Activity did not occur  FIM - Control and instrumentation engineer Devices: Bed rails Bed/Chair Transfer: 1: Two helpers  FIM - Locomotion: Wheelchair Distance: 100 Locomotion: Wheelchair: 1: Total Assistance/staff pushes wheelchair (Pt<25%) FIM - Locomotion: Ambulation Ambulation/Gait Assistance: Not tested (comment) Locomotion: Ambulation: 0: Activity did not occur  Comprehension Comprehension Mode: Auditory Comprehension: 5-Follows basic conversation/direction: With extra time/assistive device  Expression Expression Mode: Verbal Expression: 5-Expresses basic 90% of the time/requires cueing < 10% of the time.  Social Interaction Social Interaction: 4-Interacts appropriately 75 - 89% of the time - Needs redirection for appropriate language or to initiate interaction.  Problem Solving Problem Solving: 3-Solves basic 50 - 74% of the time/requires cueing 25 - 49% of the time  Memory Memory: 3-Recognizes or recalls 50 - 74% of the time/requires cueing 25 - 49% of the time  Medical Problem List and Plan: 1. Functional deficits secondary to Right MCA infarct with left hemiparesis, dysarthria, dysphagia, and visual spatial deficits 2. DVT Prophylaxis/Anticoagulation: Pharmaceutical: Lovenox 3. New  onset Headaches/Pain Management: Continue tylenol prn for now.Left ankle OA, tramadol 183m prior to therapy, avoid NSAIDs secondary to recent CVA, pt doesn't tolerate stronger pain meds from mental status standpoint 5. Neuropsych: This patient is not capable of making decisions on his own behalf. 6. Skin/Wound Care: Routine pressure relief measures.  7. Fluids/Electrolytes/Nutrition: Monitor I/O.Hold IVF, recheck BMET on 12/16 8. HTN: Will monitor every 8 hours. Currently controlled of medications (and to avoid hypotension). will resume medications as indicated. flomax may lower BP    10 OSA: Continue CPAP (has home machine) whenever asleep. poor compliance, discussed increase CVA risk, ? desat at noc 11. Recent esophagitis/duodenitis due to gastric ulcer: Protonix bid to help promote healing 12. CAD: Has chest wall pain due to poorly Healed sternum. Continue ASA, plavix, lipitor--off imdur and coreg  13.  Left ankle pain with ROM and wt bearing, non progressive, exam with minimal swelling, no erythema, not consistent, xray with mod OA, tramadol is  helping but makes him drowsy, avoid NSAID secondary to recent CVA 14.  Probable UTI will tx with empiric abx pending cx, enterococcus, change to amoxil LOS (Days) 17 A FACE TO FACE EVALUATION WAS PERFORMED  KIRSTEINS,ANDREW E 03/04/2014, 7:33 AM

## 2014-03-04 NOTE — Progress Notes (Signed)
Occupational Therapy Weekly Progress Note  Patient Details  Name: Gregory Wiley MRN: [id number] Date of Birth: 04-Aug-1950  Beginning of progress report period: February 24, 2014 End of progress report period: March 04, 2014  Today's Date: 03/04/2014 OT Individual Time: 0800-0930 OT Individual Time Calculation (min): 90 min    Patient has met 2 of 5 short term goals.  Pt is continuing to make slow gains with OT secondary to limitations with pain in the left ankle and left shoulder.  He continues to need +2 assist for selfcare tasks and functional transfers however, B/D has progressed from supine in bed to sit to stand.  He continues to improve with scanning to the left to compensate for visual field deficits during selfcare tasks.  He still demonstrates only trace shoulder movements in the LUE and stage I in the hand.  Total assist to integrate the LUE into bathing tasks as a stabilizer.  [name] continues with flat affect as well.  Recommend continued OT to further progress selfcare independence.  Plan to continue rehab until SNF transition has been established.  Patient continues to demonstrate the following deficits: decreased balance, decreased LUE coordination and strength, visual compensation, and therefore will continue to benefit from skilled OT intervention to enhance overall performance with BADL.  Patient progressing toward long term goals..  Continue plan of care.  OT Short Term Goals Week 3:  OT Short Term Goal 1 (Week 3): Pt will perform UB dressing with min assist and mod instructional cueing.   OT Short Term Goal 2 (Week 3): Pt will roll to the right side in bed during LB selfcare with max assist and mod instructional cueing for sequencing. OT Short Term Goal 3 (Week 3): Pt will perform toilet transfers squat pivot to drop arm commode with max assist. OT Short Term Goal 4 (Week 3): Pt will integrate the LUE into bathing tasks as a stabilizer with max assist and min  instructional cueing.    Skilled Therapeutic Interventions/Progress Updates:    Pt transferred via sliding board to the roll in shower chair with total assist +2 (pt 30%).  He performed shower with overall max assist in sitting.  Max instructional cueing needed for pt to sequence through bathing including thoroughness as he did not wash the left side of his head or the LUE.  Total assist to integrate the LUE into washing the right arm.  Pt did not stand in the shower secondary to safety risks.  He was able to roll out to the sink for dressing sit to stand.  Pt continues to report dizziness and some nausea during session.  BP taken in sitting at 113/81.  He was able to perform multiple sit to stand transitions with total assist +2 for managing clothing.  He was able to also assist with pulling up pants in standing as well.  Standing endurance is still limited secondary pain in the left ankle but pt was also distracted because of a suppository that he had last night.    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

## 2014-03-05 ENCOUNTER — Inpatient Hospital Stay (HOSPITAL_COMMUNITY): Payer: 59 | Admitting: Occupational Therapy

## 2014-03-05 LAB — URINE CULTURE: Colony Count: >=100000

## 2014-03-05 LAB — CORTISOL-AM, BLOOD: Cortisol - AM: 13 ug/dL (ref 4.3–22.4)

## 2014-03-05 NOTE — Progress Notes (Signed)
Occupational Therapy Session Note  Patient Details  Name: Gregory Wiley MRN: [id number] Date of Birth: 02-14-1951  Today's Date: 03/05/2014 OT Individual Time: 1030-1130 OT Individual Time Calculation (min): 60 min    Short Term Goals: Week 1:  OT Short Term Goal 1 (Week 1): Pt will roll to the right side in bed during LB selfcare with max assist and mod instructional cueing for sequencing. OT Short Term Goal 1 - Progress (Week 1): Not met OT Short Term Goal 2 (Week 1): Pt's wife will return demonstrate safe assist with LUE PROM exercises. OT Short Term Goal 2 - Progress (Week 1): Met OT Short Term Goal 3 (Week 1): Pt will complete UB bathing sitting EOB unsupported and min assist.  OT Short Term Goal 3 - Progress (Week 1): Met OT Short Term Goal 4 (Week 1): Pt will perform UB dressing with min assist and mod instructional cueing.   OT Short Term Goal 4 - Progress (Week 1): Not met OT Short Term Goal 5 (Week 1): Pt will perform toilet transfer sliding board or squat pivot with total assist +2 (pt 25%) to bariatric drop arm commode.  OT Short Term Goal 5 - Progress (Week 1): Not met Week 2:  OT Short Term Goal 1 (Week 2): Pt will perform toilet transfer sliding board or squat pivot with total assist +2 (pt 25%) to bariatric drop arm commode.  OT Short Term Goal 1 - Progress (Week 2): Met OT Short Term Goal 2 (Week 2): Pt will roll to the right side in bed during LB selfcare with max assist and mod instructional cueing for sequencing. OT Short Term Goal 2 - Progress (Week 2): Not met OT Short Term Goal 3 (Week 2): Pt will perform UB dressing with min assist and mod instructional cueing.   OT Short Term Goal 3 - Progress (Week 2): Not met OT Short Term Goal 4 (Week 2): Pt will be able to perform LB bathing sit to stand with total assist +2 (pt 30%).   OT Short Term Goal 4 - Progress (Week 2): Met OT Short Term Goal 5 (Week 2): Pt will locate bathing and grooming items left of midline with  no more than mod questioning cues. . OT Short Term Goal 5 - Progress (Week 2): Met  Skilled Therapeutic Interventions/Progress Updates:    Pt seen for BADL retraining and functional mobility with a focus on L side awareness, use of LEs and trunk control with transfers.  Pt in bed with wife in the room. Pt needed total A to sit up from side lying due to dense hemiplegia. Pt sitting on EOB to work on active weight shifting to R hip. Pt set up with slide board and education with pt's wife on covering board when pt is not in pants.  While waiting for 2nd assist to arrive, pt worked on wt shifting and reaching with RUE. He continually asked to lay down stating that he felt "doped up". Encouraged pt to work through session. +2 A arrived and pt did help with the first half of the transfer, but then needed total A to  Complete transfer to w/c. At sink pt worked on UB self care with min A and cues to attend to L arm.  Mod A to push into standing at sink with +2 A to stabilize balance and assist with LB self care. Used long sponge to wash feet. Pt not wanting to work longer, so short donned over feet for him  and pt stood again to have pants pulled up for him. Instructed wife in facilitating table top PROM exercises for LUE. During sh flexion, with arm fully supported, a slight increase in tension felt in LUE. Pt resting in w/c with quick release belt on.  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

## 2014-03-05 NOTE — Plan of Care (Signed)
Problem: RH BLADDER ELIMINATION Goal: RH STG MANAGE BLADDER WITH ASSISTANCE STG Manage Bladder With Total Assistance  Outcome: Not Progressing Pt c/o urgency frequency scan shows 159 cc; I&O cath 600

## 2014-03-05 NOTE — Progress Notes (Addendum)
Gregory Wiley is a 63 y.o. male 02-Aug-1950 [id number]  Subjective: Wife is c/o low BP in PT Objective: Vital signs in last 24 hours: Temp:  [97.8 F (36.6 C)-98.4 F (36.9 C)] 97.8 F (36.6 C) (12/19 0421) Pulse Rate:  [75-93] 75 (12/19 0421) Resp:  [16-18] 16 (12/19 0421) BP: (81-110)/(40-67) 110/67 mmHg (12/19 0421) SpO2:  [94 %-97 %] 94 % (12/19 0421) Weight change:  Last BM Date: 03/04/14  Intake/Output from previous day: 12/18 0701 - 12/19 0700 In: 540 [P.O.:540] Out: 900 [Urine:900] Last cbgs: CBG (last 3)  No results for input(s): GLUCAP in the last 72 hours.   Physical Exam General: No apparent distress   HEENT: not dry Lungs: Normal effort. Lungs clear to auscultation, no crackles or wheezes. Cardiovascular: Regular rate and rhythm, no edema Abdomen: S/NT/ND; BS(+) Musculoskeletal:  unchanged Neurological: No new neurological deficits Wounds: N/A    Skin: clear  Aging changes Mental state: Alert, oriented, cooperative    Lab Results: BMET    Component Value Date/Time   NA 136* 03/04/2014 0800   K 3.8 03/04/2014 0800   CL 96 03/04/2014 0800   CO2 26 03/04/2014 0800   GLUCOSE 128* 03/04/2014 0800   BUN 20 03/04/2014 0800   CREATININE 0.98 03/04/2014 0800   CREATININE 1.12 01/04/2013 1206   CALCIUM 9.2 03/04/2014 0800   GFRNONAA 85* 03/04/2014 0800   GFRAA >90 03/04/2014 0800   CBC    Component Value Date/Time   WBC 6.9 03/04/2014 0445   RBC 4.33 03/04/2014 0445   HGB 11.7* 03/04/2014 0445   HCT 37.1* 03/04/2014 0445   PLT 221 03/04/2014 0445   MCV 85.7 03/04/2014 0445   MCH 27.0 03/04/2014 0445   MCHC 31.5 03/04/2014 0445   RDW 13.9 03/04/2014 0445   LYMPHSABS 1.8 02/16/2014 0455   MONOABS 1.0 02/16/2014 0455   EOSABS 0.1 02/16/2014 0455   BASOSABS 0.0 02/16/2014 0455    Studies/Results: No results found.  Medications: I have reviewed the patient's current medications.  Assessment/Plan:  1. Functional deficits secondary to Right  MCA infarct with left hemiparesis, dysarthria, dysphagia, and visual spatial deficits 2. DVT Prophylaxis/Anticoagulation: Pharmaceutical: Lovenox 3. New onset Headaches/Pain Management: Continue tylenol prn for now.Left ankle OA, tramadol 100mg  prior to therapy, avoid NSAIDs secondary to recent CVA, pt doesn't tolerate stronger pain meds from mental status standpoint 5. Neuropsych: This patient is not capable of making decisions on his own behalf. 6. Skin/Wound Care: Routine pressure relief measures.  7. Fluids/Electrolytes/Nutrition: Monitor I/O.Hold IVF, recheck BMET on 12/16 8. HTN: Will monitor every 8 hours. Currently controlled off medications (and to avoid hypotension). will resume medications as indicated. flomax may lower BP 9. OSA: Continue CPAP (has home machine) whenever asleep. poor compliance, discussed increase CVA risk, ? desat at noc 10. Recent esophagitis/duodenitis due to gastric ulcer: Protonix bid to help promote healing 11. CAD: Has chest wall pain due to poorly Healed sternum. Continue ASA, plavix, lipitor--off imdur and coreg  12. Left ankle pain with ROM and wt bearing, non progressive, exam with minimal swelling, no erythema, not consistent, xray with mod OA, tramadol is helping but makes him drowsy, avoid NSAID secondary to recent CVA 13. Probable UTI will tx with empiric abx pending cx, enterococcus, change to amoxil 14. Low BP - discussed. Drink more fluids. Cortisol level in am . Other labs ok     Length of stay, days: 18  [name] , MD 03/05/2014, 9:40 AM

## 2014-03-06 ENCOUNTER — Inpatient Hospital Stay (HOSPITAL_COMMUNITY): Payer: 59 | Admitting: Physical Therapy

## 2014-03-06 ENCOUNTER — Inpatient Hospital Stay (HOSPITAL_COMMUNITY): Payer: 59 | Admitting: *Deleted

## 2014-03-06 MED ORDER — AMOXICILLIN 500 MG PO CAPS
500.0000 mg | ORAL_CAPSULE | Freq: Three times a day (TID) | ORAL | Status: DC
  Administered 2014-03-06 – 2014-03-12 (×18): 500 mg via ORAL
  Filled 2014-03-06 (×21): qty 1

## 2014-03-06 NOTE — Progress Notes (Signed)
At [place] complained of feeling like he needed to void, I & O cath=375. Reports feeling better.Patrici Ranks A

## 2014-03-06 NOTE — Progress Notes (Signed)
RT Note: Patient CPAP set up at bedside.  Added H20 to chamber and placed nasal pillows on patient. Tolerating well at this time.

## 2014-03-06 NOTE — Progress Notes (Addendum)
Gregory Wiley is a 63 y.o. male Jul 22, 1950 [id number]  Subjective: No complaints Objective: Vital signs in last 24 hours: Temp:  [97.7 F (36.5 C)-97.8 F (36.6 C)] 97.7 F (36.5 C) (12/20 0517) Pulse Rate:  [70-84] 70 (12/20 0517) Resp:  [18-19] 18 (12/20 0517) BP: (119-121)/(71-73) 121/71 mmHg (12/20 0517) SpO2:  [95 %-96 %] 96 % (12/20 0517) Weight change:  Last BM Date: 03/04/14  Intake/Output from previous day: 12/19 0701 - 12/20 0700 In: 720 [P.O.:720] Out: 1550 [Urine:1550] Last cbgs: CBG (last 3)  No results for input(s): GLUCAP in the last 72 hours.   Physical Exam General: No apparent distress  Obese HEENT: not dry Lungs: Normal effort. Lungs clear to auscultation, no crackles or wheezes. Cardiovascular: Regular rate and rhythm, no edema Abdomen: S/NT/ND; BS(+) Musculoskeletal:  unchanged Neurological: No new neurological deficits Wounds: N/A    Skin: clear  Aging changes Mental state: Alert, oriented, cooperative    Lab Results: BMET    Component Value Date/Time   NA 136* 03/04/2014 0800   K 3.8 03/04/2014 0800   CL 96 03/04/2014 0800   CO2 26 03/04/2014 0800   GLUCOSE 128* 03/04/2014 0800   BUN 20 03/04/2014 0800   CREATININE 0.98 03/04/2014 0800   CREATININE 1.12 01/04/2013 1206   CALCIUM 9.2 03/04/2014 0800   GFRNONAA 85* 03/04/2014 0800   GFRAA >90 03/04/2014 0800   CBC    Component Value Date/Time   WBC 6.9 03/04/2014 0445   RBC 4.33 03/04/2014 0445   HGB 11.7* 03/04/2014 0445   HCT 37.1* 03/04/2014 0445   PLT 221 03/04/2014 0445   MCV 85.7 03/04/2014 0445   MCH 27.0 03/04/2014 0445   MCHC 31.5 03/04/2014 0445   RDW 13.9 03/04/2014 0445   LYMPHSABS 1.8 02/16/2014 0455   MONOABS 1.0 02/16/2014 0455   EOSABS 0.1 02/16/2014 0455   BASOSABS 0.0 02/16/2014 0455    Studies/Results: No results found.  Medications: I have reviewed the patient's current medications.  Assessment/Plan:  1. Functional deficits secondary to Right MCA  infarct with left hemiparesis, dysarthria, dysphagia, and visual spatial deficits 2. DVT Prophylaxis/Anticoagulation: Pharmaceutical: Lovenox 3. New onset Headaches/Pain Management: Continue tylenol prn for now.Left ankle OA, tramadol 100mg  prior to therapy, avoid NSAIDs secondary to recent CVA, pt doesn't tolerate stronger pain meds from mental status standpoint 5. Neuropsych: This patient is not capable of making decisions on his own behalf. 6. Skin/Wound Care: Routine pressure relief measures.  7. Fluids/Electrolytes/Nutrition: Monitor I/O.Hold IVF, recheck BMET on 12/16 8. HTN: Will monitor every 8 hours. Currently controlled off medications (and to avoid hypotension). will resume medications as indicated. flomax may lower BP 9. OSA: Continue CPAP (has home machine) whenever asleep. poor compliance, discussed increase CVA risk, ? desat at noc 10. Recent esophagitis/duodenitis due to gastric ulcer: Protonix bid to help promote healing 11. CAD: Has chest wall pain due to poorly Healed sternum. Continue ASA, plavix, lipitor--off imdur and coreg  12. Left ankle pain with ROM and wt bearing, non progressive, exam with minimal swelling, no erythema, not consistent, xray with mod OA, tramadol is helping but makes him drowsy, avoid NSAID secondary to recent CVA 13. Probable UTI will tx with empiric abx pending cx, enterococcus, change to amoxil 14. Low BP - discussed. Drink more fluids. Cortisol level in am - normal. Other labs ok    Length of stay, days: 19  [name] , MD 03/06/2014, 9:35 AM

## 2014-03-06 NOTE — Progress Notes (Signed)
ANTIBIOTIC NOTE  Pharmacy Re:  Amoxiciliin  Indication: Enterococcus UTI  Allergies  Allergen Reactions  . Diphenhydramine Hcl Anaphylaxis   Microbiology: Recent Results (from the past 720 hour(s))  Culture, Urine     Status: None   Collection Time: 03/02/14  5:22 PM  Result Value Ref Range Status   Specimen Description URINE, CATHETERIZED  Final   Colony Count   Final    >=100,000 COLONIES/ML - Performed at [facility]    Culture   Final    ENTEROCOCCUS SPECIES - Performed at [facility]    Report Status 03/05/2014 FINAL  Final   Organism ID, Bacteria ENTEROCOCCUS SPECIES  Final      Susceptibility   Enterococcus species - MIC*    AMPICILLIN <=2 SENSITIVE Sensitive     LEVOFLOXACIN 1 SENSITIVE Sensitive     NITROFURANTOIN <=16 SENSITIVE Sensitive     VANCOMYCIN 1 SENSITIVE Sensitive     TETRACYCLINE <=1 SENSITIVE Sensitive     * ENTEROCOCCUS SPECIES   Assessment: 63 yo male with Enterococcus UTI and prescribed Amoxil 250mg  tid.  He has a creatinine of 0.98 and an estimated crcl of 162ml/hr.  He is morbidly obese and continues to complain of urgency.  He has had a past culture on 12/4 that was a clean catch and revealed 60,000 colonies of [place] which was resistance to PCN.  Since he has had multiple UTI's, will go ahead and increase his dose of Amoxil to ensure adequate coverage.  Plan:  1.  Will increase his Amoxil dose to 500mg  every 8 hours 2.  Consider LOT for his current UTI treatment (7-10 days)  Rober Minion, PharmD., MS Clinical Pharmacist Pager:  [phone] Thank you for allowing pharmacy to be part of this patients care team. 03/06/2014,9:38 AM

## 2014-03-06 NOTE — Plan of Care (Signed)
Problem: RH BOWEL ELIMINATION Goal: RH STG MANAGE BOWEL WITH ASSISTANCE STG Manage Bowel with Mod Assistance.  Outcome: Not Progressing Per patient he needs laxative in the evening last bm 12/18  Problem: RH BLADDER ELIMINATION Goal: RH STG MANAGE BLADDER WITH ASSISTANCE STG Manage Bladder With Total Assistance  Outcome: Not Progressing Needs intermittent catheterization

## 2014-03-06 NOTE — Progress Notes (Signed)
Physical Therapy Session Note  Patient Details  Name: Gregory Wiley MRN: [id number] Date of Birth: 06-19-1950  Today's Date: 03/06/2014 PT Individual Time: 1300-1400 PT Individual Time Calculation (min): 60 min   Short Term Goals: Week 1:  PT Short Term Goal 1 (Week 1): Pt will perform rolling to L with min A and to R with total A x 1.  PT Short Term Goal 1 - Progress (Week 1): Progressing toward goal PT Short Term Goal 2 (Week 1): Pt will perform sit <> supine with total A x 1.  PT Short Term Goal 2 - Progress (Week 1): Progressing toward goal PT Short Term Goal 3 (Week 1): Pt will perform bed <> w/c transfer with +2 assist.  PT Short Term Goal 3 - Progress (Week 1): Met PT Short Term Goal 4 (Week 1): Pt will maintain dynamic sitting balance x 3 min with supervision.  PT Short Term Goal 4 - Progress (Week 1): Met PT Short Term Goal 5 (Week 1): Pt will propel w/c x 50 ft with min A. PT Short Term Goal 5 - Progress (Week 1): Met Week 2:  PT Short Term Goal 1 (Week 2): Pt will perform rolling to L with min A and to R with total A x 1.  PT Short Term Goal 1 - Progress (Week 2): Met PT Short Term Goal 2 (Week 2): Pt will perform sit <> supine with total A x 1 with use of hospital bed functions. PT Short Term Goal 2 - Progress (Week 2): Met PT Short Term Goal 3 (Week 2): Pt will perform dynamic standing balance x 1 min with +2 assist. PT Short Term Goal 3 - Progress (Week 2): Met PT Short Term Goal 4 (Week 2): Patient will perform bed <> w/c transfer with mod A x 2.  PT Short Term Goal 4 - Progress (Week 2): Met Week 3:  PT Short Term Goal 1 (Week 3): Pt will perform sit <> supine with total A x 1 with use of hospital bed functions with 25% cues for sequencing.  PT Short Term Goal 2 (Week 3): Patient will initiate transfer bed <> w/c with +2 assist and 25% cues for sequencing.  PT Short Term Goal 3 (Week 3): Patient will maintain static standing x 3 min.  PT Short Term Goal 4 (Week 3):  Patient will initiate gait training with +2 assist.  Week 4:     Skilled Therapeutic Interventions/Progress Updates:    Pt limited in later transfers in session secondary to fatigue requiring more time and assist. Pt best transferring to R side. Pt reports R sided tightness that improves post R side lateral L/S flexor soft tissue stretch. Pt would continue to benefit from skilled PT services to increase functional mobility.  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 Pt performs standing balance with +2 assist x7 in session with standing from 30" to 60". Pt performs static sitting during all rest breaks. R L/S flexors  soft tissue stretch with sheet. Pt performs R handed reaching with LUE and LLE forced use reaching laterally and superiorly 4x10. Static sitting with L sided forced use 1'x2. L sided forced use in anterior weight shifts 2x10. Pt and family educated on rehab plan.  See FIM for current functional status  Therapy/Group: Individual Therapy  Monia Pouch 03/06/2014, 4:34 PM

## 2014-03-07 ENCOUNTER — Inpatient Hospital Stay (HOSPITAL_COMMUNITY): Payer: 59 | Admitting: Rehabilitation

## 2014-03-07 ENCOUNTER — Inpatient Hospital Stay (HOSPITAL_COMMUNITY): Payer: 59 | Admitting: Speech Pathology

## 2014-03-07 ENCOUNTER — Inpatient Hospital Stay (HOSPITAL_COMMUNITY): Payer: 59 | Admitting: Occupational Therapy

## 2014-03-07 LAB — GLUCOSE, CAPILLARY: Glucose-Capillary: 104 mg/dL — ABNORMAL HIGH (ref 70–99)

## 2014-03-07 NOTE — Progress Notes (Signed)
Placed patient on his home CPAP.  Patient is tolerating well at this time.

## 2014-03-07 NOTE — Progress Notes (Signed)
Speech Language Pathology Daily Session Note  Patient Details  Name: Gregory Wiley MRN: [id number] Date of Birth: 1951/01/08  Today's Date: 03/07/2014 SLP Individual Time: 1400-1500 SLP Individual Time Calculation (min): 60 min  Short Term Goals: Week 3: SLP Short Term Goal 1 (Week 3): Pt will tolerate presentations of his currently prescribed diet with no overt s/s of aspiration with min cues for use of swallowing precautions SLP Short Term Goal 2 (Week 3): Pt will tolerate trials of advanced consistencies to assess readiness for diet progression with no overt s/s of aspiration and min cues for use of swallowing precautions.  SLP Short Term Goal 3 (Week 3): Patient will recall and scan to the left of midline to locate items as needed, during basic functional tasks with Min mulitmodal cues  SLP Short Term Goal 4 (Week 3): Patient will produce intelligible speech at the phrase-sentence level of vebral expression with supervision cues to utilize speech intelligibilty strategies SLP Short Term Goal 5 (Week 3): Patient will label 2 physical and 2 cognitive deficits with Mod question cues SLP Short Term Goal 6 (Week 3): Patient will sustain attention to basic tasks for 3-5  minutes with Mod verbal cues for redirections  Skilled Therapeutic Interventions: Skilled treatment session focused on addressing dysphagia and cognitive goals. Patient required Mod faded to Min cues to initiate, organize and solve moderately complex functional problems.  SLP also facilitated session with Mod encouragement for attempts at Dys.3 and regular texture upgraded trials.  Patient required increased time and Mod faded to Min cues for sustained attention to prolonged mastication.  Given that patient demonstrated a delayed cough following regular texture trials that appeared to clear following thin liquid cup sips.  As a result, recommend initiation of Dys. 3 textures and continuation of thin liquids via cup with full  supervision.     FIM:  Comprehension Comprehension Mode: Auditory Comprehension: 5-Follows basic conversation/direction: With extra time/assistive device Expression Expression Mode: Verbal Expression: 5-Expresses basic 90% of the time/requires cueing < 10% of the time. Social Interaction Social Interaction: 4-Interacts appropriately 75 - 89% of the time - Needs redirection for appropriate language or to initiate interaction. Problem Solving Problem Solving: 3-Solves basic 50 - 74% of the time/requires cueing 25 - 49% of the time Memory Memory: 4-Recognizes or recalls 75 - 89% of the time/requires cueing 10 - 24% of the time FIM - Eating Eating Activity: 4: Helper checks for pocketed food;5: Needs verbal cues/supervision  Pain Pain Assessment Pain Assessment: No/denies pain Pain Score: 0-No pain  Therapy/Group: Individual Therapy  Carmelia Roller., CCC-SLP [phone]  [place] 03/07/2014, 4:46 PM

## 2014-03-07 NOTE — Progress Notes (Signed)
Occupational Therapy Session Note  Patient Details  Name: Gregory Wiley MRN: [id number] Date of Birth: 1951-02-13  Today's Date: 03/07/2014 OT Individual Time: 0800-0900 OT Individual Time Calculation (min): 60 min    Short Term Goals: Week 3:  OT Short Term Goal 1 (Week 3): Pt will perform UB dressing with min assist and mod instructional cueing.   OT Short Term Goal 2 (Week 3): Pt will roll to the right side in bed during LB selfcare with max assist and mod instructional cueing for sequencing. OT Short Term Goal 3 (Week 3): Pt will perform toilet transfers squat pivot to drop arm commode with max assist. OT Short Term Goal 4 (Week 3): Pt will integrate the LUE into bathing tasks as a stabilizer with max assist and min instructional cueing.    Skilled Therapeutic Interventions/Progress Updates:   Pt performed bathing and dressing sit to stand at the EOB this session.  Min assist for sitting balance while attempting to reach across his body to the left of the bedside table to reach soap and washpan.  Pt was able to scan to the left of midline to locate these objects with only 1-2 questioning cues.  Mod questioning cueing to sequence through the bathing task as pt is disorganized and attempts his legs after his face instead of a top down approach.  He was able to perform sit to stand multiple times from EOB with a heavy mod assist of one person, however he still needs +2 (pt 40%) for managing peri washing and pulling his shorts and incontinence brief over his hips.  Performed stand pivot to the wheelchair with total assist +2 (pt 30%).  He needs max facilitation at the left knee during weightbearing when attempting to advance the LLE.     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

## 2014-03-07 NOTE — Consult Note (Signed)
Neuropsychology Psychosocial - Confidential [place] [facility] _____________________________________________________________________________   [name] was seen for a follow-up neuropsychological session today to provide supportive psychotherapy at the request of the treatment team and given recommendations after his last neuropsychological evaluation that revealed the presence of depressive symptoms.    [name] continued to describe the presence of depressive symptoms, but stated that he is encouraged by the opportunity to [place] next week.  He lamented that he tried to sing, per my recommendation from last session, but was unable to do so in tune.  He also continued to endorse frustration over difficulty being able to identify physical progress that he is making.  During today's session, time was spent exploring his reactions to having a stroke and in identifying coping mechanisms that he can use in this difficult time.  He cited spiritual faith and staff encouragement as helping him to push through difficult times.  Time was also spent in processing his fears about transitioning to a skilled nursing facility.  Mainly, he expressed concern about their ability to physically assist him, as there have been times when he worried that the physical therapists here would not be able to support him if he fell.    Owing to his continued experience of symptoms of depression, [name] was open to discussing initiation of an antidepressant with his physician, but did not want to begin anything without having a discussion first.   His physician may choose to have that discussion with him during their next meeting.  Also, [name] may benefit from having some holiday decorations in his room as well as pictures of him as [place] posted in the room.  This may serve to lift his spirits and help him to engage better in therapies.  Continued follow-up for support could be provided prior to  discharge, should his treatment team feel that he would benefit.  In addition, information on psychotherapists in his area should be included in his discharge paperwork, as continued support post-discharge will likely be beneficial.    DIAGNOSES: CVA Adjustment disorder with depressed mood  Greater than 50% of this visit was spent educating the patient about the possible diagnosis, prognosis, management plan, and in coordination of care.   Marlane Hatcher, PsyD Clinical Neuropsychologist

## 2014-03-07 NOTE — Progress Notes (Signed)
Physical Therapy Session Note  Patient Details  Name: Gregory Wiley MRN: [id number] Date of Birth: 1951-03-01  Today's Date: 03/07/2014 PT Individual Time: 1030-1130 PT Individual Time Calculation (min): 60 min   Short Term Goals: Week 3:  PT Short Term Goal 1 (Week 3): Pt will perform sit <> supine with total A x 1 with use of hospital bed functions with 25% cues for sequencing.  PT Short Term Goal 2 (Week 3): Patient will initiate transfer bed <> w/c with +2 assist and 25% cues for sequencing.  PT Short Term Goal 3 (Week 3): Patient will maintain static standing x 3 min.  PT Short Term Goal 4 (Week 3): Patient will initiate gait training with +2 assist.   Skilled Therapeutic Interventions/Progress Updates:   Pt received sitting in w/c in room, agreeable to therapy session.  Wife present during session to observe and provide encouragement.  Skilled session focused on gait training in hallway for upright posture, increased fluidity of stepping sequence, increased weight shift to the R for advancing LLE, and for WB forward and over LLE during L stance phase.  Performed several bouts of gait (x 5', x 8' x 13') via "three muskateer style" and also with use of R handrail and then with use of GiveMohr sling for supporting LUE.  Also added shoe cover on L shoe for increased clearance during swing phase of gait.  Pt does very well when therapists assisted with increasing gait speed.  He also does well with increased focus on task, as he would tend to look away near end of "target" and would end up with R LE behind him with buttocks behind him and trunk forward.  Pt continues to yell aloud in pain in L shoulder (therefore donned sling), also yells "I can't" and "I need to sit."  Provided max encouragement throughout session as well as visual targets for him to reach.  During gait, requires assist for upright posture, weight shifting to the R, stabilization of L knee, advancing L LE, and weight shift forward  and over LLE.  Ended session with 3 attempts of ascending/descending single 4" step with R rail.  Requires +3 assist for safety with therapist on each side and assist for LLE up to step and down and to have chair close.  Pt with increased c/o dizziness and "hotness" but did finally complete single step.  Pt assisted back to w/c and tilted back, L arm trough and quick release belt donned.  All needs in reach.  Wife in room.   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

## 2014-03-07 NOTE — Progress Notes (Signed)
Subjective/Complaints: Ankle pain better in Left ankle brace Buttocks pain in bed  Review of Systems - Negative except above    Poor awareness of deficits secondary to R MCA lg infarct Objective: Vital Signs: Blood pressure 116/70, pulse 83, temperature 98.6 F (37 C), temperature source Oral, resp. rate 18, height $RemoveBe'6\' 1"'xQCRryROX$  (1.854 m), weight 147.9 kg (326 lb 1 oz), SpO2 92 %. No results found. Results for orders placed or performed during the hospital encounter of 02/15/14 (from the past 72 hour(s))  Basic metabolic panel     Status: Abnormal   Collection Time: 03/04/14  8:00 AM  Result Value Ref Range   Sodium 136 (L) 137 - 147 mEq/L   Potassium 3.8 3.7 - 5.3 mEq/L   Chloride 96 96 - 112 mEq/L   CO2 26 19 - 32 mEq/L   Glucose, Bld 128 (H) 70 - 99 mg/dL   BUN 20 6 - 23 mg/dL   Creatinine, Ser 0.98 0.50 - 1.35 mg/dL   Calcium 9.2 8.4 - 10.5 mg/dL   GFR calc non Af Amer 85 (L) >90 mL/min   GFR calc Af Amer >90 >90 mL/min    Comment: (NOTE) The eGFR has been calculated using the CKD EPI equation. This calculation has not been validated in all clinical situations. eGFR's persistently <90 mL/min signify possible Chronic Kidney Disease.    Anion gap 14 5 - 15  Cortisol-am, blood     Status: None   Collection Time: 03/05/14 12:50 PM  Result Value Ref Range   Cortisol - AM 13.0 4.3 - 22.4 ug/dL    Comment: Performed at [facility]: normal Cardio: RRR and no murmur Resp: CTA B/L and unlabored GI: BS positive and NT, ND Extremity:  Pulses positive and No Edema Skin:   Intact Neuro: Alert/Oriented, Cranial Nerve Abnormalities Left central 7, Normal Sensory, Abnormal Motor 0/5 LUE and LLE persistent. Does sense pain. , Dysarthric and left Inattention, right gaze preference better MAS 2-3 Left elbow flexors  Musc/Skel:  Other no pain with Left shoulder ROM MSK- Occ pain with ankle DF/PF Gen NAD   Assessment/Plan: 1. 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 Physiatrist is providing close team supervision and 24 hour management of active medical problems listed below. Physiatrist and rehab team continue to assess barriers to discharge/monitor patient progress toward functional and medical goals.  FIM: FIM - Bathing Bathing Steps Patient Completed: Chest, Abdomen, Right upper leg, Left Arm, Front perineal area, Left upper leg, Right lower leg (including foot), Left lower leg (including foot) (used long sponge) Bathing: 1: Two helpers  FIM - Upper Body Dressing/Undressing Upper body dressing/undressing steps patient completed: Thread/unthread right sleeve of pullover shirt/dresss, Put head through opening of pull over shirt/dress, Pull shirt over trunk Upper body dressing/undressing: 4: Min-Patient completed 75 plus % of tasks FIM - Lower Body Dressing/Undressing Lower body dressing/undressing steps patient completed: Thread/unthread right underwear leg Lower body dressing/undressing: 1: Two helpers  FIM - Toileting Toileting: 0: Activity did not occur  FIM - Air cabin crew Transfers: 0-Activity did not occur  FIM - Control and instrumentation engineer Devices: Sliding board Bed/Chair Transfer: 1: Two helpers  FIM - Locomotion: Wheelchair Distance: 100 Locomotion: Wheelchair: 1: Total Assistance/staff pushes wheelchair (Pt<25%) FIM - Locomotion: Ambulation Locomotion: Ambulation Assistive Devices:  (3 musketeers style)  Ambulation/Gait Assistance: 1: +2 Total assist Locomotion: Ambulation: 0: Activity did not occur  Comprehension Comprehension Mode: Auditory Comprehension: 5-Follows basic conversation/direction: With extra time/assistive device  Expression Expression Mode: Verbal Expression: 5-Expresses basic 90% of the time/requires  cueing < 10% of the time.  Social Interaction Social Interaction: 4-Interacts appropriately 75 - 89% of the time - Needs redirection for appropriate language or to initiate interaction.  Problem Solving Problem Solving: 3-Solves basic 50 - 74% of the time/requires cueing 25 - 49% of the time  Memory Memory: 3-Recognizes or recalls 50 - 74% of the time/requires cueing 25 - 49% of the time  Medical Problem List and Plan: 1. Functional deficits secondary to Right MCA infarct with left hemiparesis, dysarthria, dysphagia, and visual spatial deficits 2. DVT Prophylaxis/Anticoagulation: Pharmaceutical: Lovenox 3. New onset Headaches/Pain Management: Continue tylenol prn for now.Left ankle OA, tramadol $RemoveBefo'100mg'[id number]$  prior to therapy, avoid NSAIDs secondary to recent CVA, overall improved 5. Neuropsych: This patient is not capable of making decisions on his own behalf. 6. Skin/Wound Care: Routine pressure relief measures. turn q2 h 7. Fluids/Electrolytes/Nutrition: Monitor I/O.Hold IVF, recheck BMET on 12/18 was nl 8. HTN: Will monitor every 8 hours. Currently controlled of medications (and to avoid hypotension). will resume medications as indicated. flomax may lower BP    10 OSA: Continue CPAP (has home machine) whenever asleep. poor compliance, discussed increase CVA risk, ? desat at noc 11. Recent esophagitis/duodenitis due to gastric ulcer: Protonix bid to help promote healing 12. CAD: Has chest wall pain due to poorly Healed sternum. Continue ASA, plavix, lipitor--off imdur and coreg  13.  Left ankle pain with ROM and wt bearing, non progressive, exam with minimal swelling, no erythema, not consistent, xray with mod OA, tramadol is helping but makes him drowsy, avoid NSAID secondary to recent CVA 14.  Probable UTI will tx with empiric abx pending cx, enterococcus,  amoxil LOS (Days) 20 A FACE TO FACE EVALUATION WAS PERFORMED  Emmylou Bieker E 03/07/2014, 7:55 AM

## 2014-03-08 ENCOUNTER — Inpatient Hospital Stay (HOSPITAL_COMMUNITY): Payer: 59 | Admitting: Physical Therapy

## 2014-03-08 ENCOUNTER — Inpatient Hospital Stay (HOSPITAL_COMMUNITY): Payer: 59 | Admitting: Speech Pathology

## 2014-03-08 ENCOUNTER — Inpatient Hospital Stay (HOSPITAL_COMMUNITY): Payer: 59 | Admitting: Occupational Therapy

## 2014-03-08 MED ORDER — ACETAMINOPHEN 325 MG PO TABS
650.0000 mg | ORAL_TABLET | Freq: Four times a day (QID) | ORAL | Status: DC | PRN
  Administered 2014-03-14 – 2014-03-15 (×6): 650 mg via ORAL
  Filled 2014-03-08 (×7): qty 2

## 2014-03-08 MED ORDER — TRAMADOL-ACETAMINOPHEN 37.5-325 MG PO TABS
1.0000 | ORAL_TABLET | ORAL | Status: DC | PRN
  Administered 2014-03-09 – 2014-03-13 (×2): 1 via ORAL
  Filled 2014-03-08 (×3): qty 1

## 2014-03-08 NOTE — Progress Notes (Signed)
Occupational Therapy Session Note  Patient Details  Name: Gregory Wiley MRN: [id number] Date of Birth: 1950-10-25  Today's Date: 03/08/2014 OT Individual Time: 8978-4784 OT Individual Time Calculation (min): 61 min    Short Term Goals: Week 3:  OT Short Term Goal 1 (Week 3): Pt will perform UB dressing with min assist and mod instructional cueing.   OT Short Term Goal 2 (Week 3): Pt will roll to the right side in bed during LB selfcare with max assist and mod instructional cueing for sequencing. OT Short Term Goal 3 (Week 3): Pt will perform toilet transfers squat pivot to drop arm commode with max assist. OT Short Term Goal 4 (Week 3): Pt will integrate the LUE into bathing tasks as a stabilizer with max assist and min instructional cueing.    Skilled Therapeutic Interventions/Progress Updates:    Pt performed bathing and dressing sit to stand at the EOB this session. Min assist for sitting balance while attempting to reach across his body to the left of the bedside table to reach soap and washpan. Pt was able to scan to the left of midline to locate these objects with only 1-2 questioning cues. Min  questioning cueing to sequence through the bathing task as pt is disorganized and attempts his legs after his face instead of a top down approach. He did come back to his UB and the left arm without cueing but needed instructional cueing to wash the RUE.  He was able to perform sit to stand multiple times from EOB with a heavy mod assist of one person, however he still needs +2 (pt 40%) for managing peri washing and pulling his shorts and incontinence brief over his hips. Performed stand pivot to the wheelchair with total assist +2 (pt 30%). He needs max facilitation at the left knee during weightbearing when attempting to advance the LLE.    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

## 2014-03-08 NOTE — Progress Notes (Signed)
Subjective/Complaints: Ankle pain better but doesn't want higher dose tramadol secondary to mental clouding  Review of Systems - Negative except above    Poor awareness of deficits secondary to R MCA lg infarct Objective: Vital Signs: Blood pressure 113/57, pulse 78, temperature 98.2 F (36.8 C), temperature source Oral, resp. rate 19, height 6\' 1"  (1.854 m), weight 147.9 kg (326 lb 1 oz), SpO2 93 %. No results found. Results for orders placed or performed during the hospital encounter of 02/15/14 (from the past 72 hour(s))  Cortisol-am, blood     Status: None   Collection Time: 03/05/14 12:50 PM  Result Value Ref Range   Cortisol - AM 13.0 4.3 - 22.4 ug/dL    Comment: Performed at [facility]  Glucose, capillary     Status: Abnormal   Collection Time: 03/07/14 10:24 PM  Result Value Ref Range   Glucose-Capillary 104 (H) 70 - 99 mg/dL     HEENT: normal Cardio: RRR and no murmur Resp: CTA B/L and unlabored GI: BS positive and NT, ND Extremity:  Pulses positive and No Edema Skin:   Intact Neuro: Alert/Oriented, Cranial Nerve Abnormalities Left central 7, Normal Sensory, Abnormal Motor 0/5 LUE and LLE persistent. Does sense pain. , Dysarthric and left Inattention, right gaze preference better MAS 2-3 Left elbow flexors  Musc/Skel:  Other no pain with Left shoulder ROM MSK- no pain with ankle DF/PF Gen NAD   Assessment/Plan: 1. 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 Physiatrist is providing close team supervision and 24 hour management of active medical problems listed below. Physiatrist and rehab team continue to assess barriers to discharge/monitor patient progress toward functional and medical goals.  FIM: FIM - Bathing Bathing Steps Patient Completed: Chest, Abdomen, Right upper leg,  Left Arm, Front perineal area, Left upper leg, Right lower leg (including foot), Left lower leg (including foot) Bathing: 1: Two helpers  FIM - Upper Body Dressing/Undressing Upper body dressing/undressing steps patient completed: Thread/unthread right sleeve of pullover shirt/dresss, Put head through opening of pull over shirt/dress, Pull shirt over trunk Upper body dressing/undressing: 4: Min-Patient completed 75 plus % of tasks FIM - Lower Body Dressing/Undressing Lower body dressing/undressing steps patient completed: Thread/unthread right pants leg Lower body dressing/undressing: 1: Two helpers  FIM - Toileting Toileting: 0: Activity did not occur  FIM - Air cabin crew Transfers: 0-Activity did not occur  FIM - Control and instrumentation engineer Devices: Sliding board Bed/Chair Transfer: 1: Two helpers  FIM - Locomotion: Wheelchair Distance: 100 Locomotion: Wheelchair: 1: Total Assistance/staff pushes wheelchair (Pt<25%) FIM - Locomotion: Ambulation Locomotion: Ambulation Assistive Devices: Other (comment), Orthosis (three muskateer style and R handrail) Ambulation/Gait Assistance: 1: +2 Total assist Locomotion: Ambulation: 1: Two helpers  Comprehension Comprehension Mode: Auditory Comprehension: 5-Follows basic conversation/direction: With extra time/assistive device  Expression Expression Mode: Verbal Expression: 5-Expresses basic 90% of the time/requires cueing < 10% of the time.  Social Interaction Social Interaction: 4-Interacts appropriately 75 - 89% of the time - Needs redirection for appropriate language or to initiate interaction.  Problem Solving Problem Solving: 3-Solves basic 50 - 74% of the time/requires cueing 25 - 49% of the time  Memory Memory: 4-Recognizes or recalls 75 - 89% of the time/requires cueing 10 - 24% of the time  Medical Problem List and Plan: 1. Functional deficits secondary to Right MCA infarct with left  hemiparesis, dysarthria, dysphagia, and visual spatial deficits 2. DVT Prophylaxis/Anticoagulation: Pharmaceutical: Lovenox 3. New onset Headaches/Pain Management: Continue tylenol prn for now.Left ankle OA, tramadol 100mg  prior to therapy, avoid NSAIDs secondary to recent CVA, overall improved 5. Neuropsych: This patient is not capable of making decisions on his own behalf. 6. Skin/Wound Care: Routine pressure relief measures. turn q2 h 7. Fluids/Electrolytes/Nutrition: Monitor I/O.Hold IVF, recheck BMET on 12/18 was nl 8. HTN: Will monitor every 8 hours. Currently controlled of medications (and to avoid hypotension). will resume medications as indicated. flomax may lower BP    10 OSA: Continue CPAP (has home machine) whenever asleep. poor compliance, discussed increase CVA risk, ? desat at noc 11. Recent esophagitis/duodenitis due to gastric ulcer: Protonix bid to help promote healing 12. CAD: Has chest wall pain due to poorly Healed sternum. Continue ASA, plavix, lipitor--off imdur and coreg  13.  Left ankle pain with ROM and wt bearing, non progressive, exam with minimal swelling, no erythema, not consistent, xray with mod OA, tramadol is helping but makes him drowsy, change to ultracet, avoid NSAID secondary to recent CVA 14.  e UTI , enterococcus, sens to amp will cont  amoxil LOS (Days) 21 A FACE TO FACE EVALUATION WAS PERFORMED  Gregory Wiley E 03/08/2014, 8:03 AM

## 2014-03-08 NOTE — Progress Notes (Signed)
Physical Therapy Session Note  Patient Details  Name: Gregory Wiley MRN: [id number] Date of Birth: 07-Dec-1950  Today's Date: 03/08/2014 PT Individual Time: 6606-3016 PT Individual Time Calculation (min): 15 min   Short Term Goals: Week 3:  PT Short Term Goal 1 (Week 3): Pt will perform sit <> supine with total A x 1 with use of hospital bed functions with 25% cues for sequencing.  PT Short Term Goal 2 (Week 3): Patient will initiate transfer bed <> w/c with +2 assist and 25% cues for sequencing.  PT Short Term Goal 3 (Week 3): Patient will maintain static standing x 3 min.  PT Short Term Goal 4 (Week 3): Patient will initiate gait training with +2 assist.   Skilled Therapeutic Interventions/Progress Updates:    Pt seen for assistance to transfer back to bed after rehab christmas party. Pt stood x2 w/ +2 ModA, with therapists providing assist to maintain upright posture. Pt able to tolerate standing initially for ~10 seconds while pt's wife pulled pants down. Pt then performed stand pivot transfers w/ +2 ModA, able to take two steps with R foot with max cueing for sequencing and weight shifting. Pt moved sit>sidelying>supine w/ +2A to manage BLE into bed. Pt left supine in bed w/ wife present and all needs within reach.  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

## 2014-03-08 NOTE — Progress Notes (Signed)
NUTRITION FOLLOW UP  DOCUMENTATION CODES Per approved criteria  -Morbid Obesity   INTERVENTION: Continue Ensure Pudding po TID, each supplement provides 170 kcal and 4 grams of protein.  Continue 30 ml Prostat po BID, each supplement provides 100 kcal and 15 grams of protein.   Encourage adequate PO intake.  NUTRITION DIAGNOSIS: Inadequate oral intake related to dysphagia, s/p stroke as evidenced by varied meal completion of 0-100%; ongoing  Goal: Pt to meet >/= 90% of their estimated nutrition needs; progressing  Monitor:  PO intake, weight trends, labs, I/O's  63 y.o. 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 MRI brain revealed acute infarct right MCA territory involving the right temporal lobe, insula, and basal ganglia and abrupt occlusion right M1 segment question due to embolus.  Meal completion has varied from 10-100%, however today's completion has been 50-100%. Pt has been taking his supplements. Will continue with current interventions. Pt was encouraged to eat his food at meals and to take his supplements.   Height: Ht Readings from Last 1 Encounters:  02/15/14 6\' 1"  (1.854 m)    Weight: Wt Readings from Last 1 Encounters:  03/02/14 326 lb 1 oz (147.9 kg)    BMI:  Body mass index is 43.03 kg/(m^2). Morbid obesity  Re-Estimated Nutritional Needs: Kcal: 2100-2400 Protein: 120-150 grams Fluid: 2.1 - 2.4 L/day  Skin: Incision left chest, non-pitting LE edema  Diet Order: DIET DYS 3    Intake/Output Summary (Last 24 hours) at 03/08/14 1512 Last data filed at 03/08/14 1302  Gross per 24 hour  Intake    790 ml  Output   1525 ml  Net   -735 ml    Last BM: 12/18  Labs:   Recent Labs Lab 03/02/14 0655 03/04/14 0800  NA 137 136*   K 4.2 3.8  CL 99 96  CO2 27 26  BUN 15 20  CREATININE 0.87 0.98  CALCIUM 9.0 9.2  GLUCOSE 110* 128*    CBG (last 3)   Recent Labs  03/07/14 2224  GLUCAP 104*    Scheduled Meds: . amoxicillin  500 mg Oral 3 times per day  . aspirin EC  81 mg Oral Daily  . atorvastatin  80 mg Oral q1800  . clopidogrel  75 mg Oral Daily  . enoxaparin (LOVENOX) injection  80 mg Subcutaneous Q24H  . feeding supplement (ENSURE)  1 Container Oral TID BM  . feeding supplement (PRO-STAT SUGAR FREE 64)  30 mL Oral BID BM  . fluticasone  2 spray Each Nare Daily  . gabapentin  100 mg Oral TID  . methylphenidate  5 mg Oral BID WC  . pantoprazole  40 mg Oral BID  . sennosides  5 mL Oral BID    Continuous Infusions:   Past Medical History  Diagnosis Date  . Coronary artery disease     stent, then CABG 07/20/10  . MI (myocardial infarction)     NSTEMI- last cath 06/2011-Stent to LCX-DES, last nuc 07/30/11 low risk  . Diabetes mellitus     hx of, diet controlled  . Hypertension   . Hyperlipidemia   . Obesity (BMI 30-39.9)   . OSA on CPAP     since July 2013  . H/O cardiomyopathy     ischemic, now last echo 07/30/11, EF 38%W  .  Diverticulosis   . Internal hemorrhoids   . Tubular adenoma of colon   . Plantar fasciitis of left foot   . Stroke     Past Surgical History  Procedure Laterality Date  . Coronary artery bypass graft  07/20/2010     LIMA-LAD; VG-ACUTE MARG of RCA; Seq VG-distal RCA & then pda  . Coronary angioplasty with stent placement  07/01/2011    DES-Resolute to native LCX  . Coronary angioplasty with stent placement  12/01/2007    COMPLEX 5 LESION PCI INCLUDING CUTTING BALLOON AND 4 CYPHER DESs  . Hernia repair  2006  . Knee surgery Right   . Hand surgery      to take glass out  . Colonoscopy N/A 02/10/2013    Procedure: COLONOSCOPY;  Surgeon: Ladene Artist, MD;  Location: [facility] ENDOSCOPY;  Service: Endoscopy;  Laterality: N/A;  . Retinal detachment surgery    . Loop recorder  implant  02/15/2014    MDT LINQ implanted by Dr Rayann Heman for cryptogenic stroke  . Tee without cardioversion N/A 02/15/2014    Procedure: TRANSESOPHAGEAL ECHOCARDIOGRAM (TEE);  Surgeon: Josue Hector, MD;  Location: [facility] ENDOSCOPY;  Service: Cardiovascular;  Laterality: N/A;  . Left heart catheterization with coronary/graft angiogram  07/01/2011    Procedure: LEFT HEART CATHETERIZATION WITH Beatrix Fetters;  Surgeon: Sanda Klein, MD;  Location: [facility] CATH LAB;  Service: Cardiovascular;;  . Percutaneous coronary stent intervention (pci-s) Right 07/01/2011    Procedure: PERCUTANEOUS CORONARY STENT INTERVENTION (PCI-S);  Surgeon: Sanda Klein, MD;  Location: [facility] CATH LAB;  Service: Cardiovascular;  Laterality: Right;    Kallie Locks, MS, RD, LDN Pager # [phone] After hours/ weekend pager # [phone]

## 2014-03-08 NOTE — Progress Notes (Signed)
Social Work Patient ID: Gregory Wiley, male   DOB: 08/14/1950, 63 y.o.   MRN: [id number]  Met with pt and wife to discuss readiness for next venue and choices, have faxed out to [facility] their first choice.  Aware will need to expand the search, if no bed there. Both are agreeable to this plan.

## 2014-03-08 NOTE — Progress Notes (Signed)
Speech Language Pathology Daily Session Note  Patient Details  Name: Gregory Wiley MRN: [id number] Date of Birth: 03-May-1950  Today's Date: 03/08/2014 SLP Individual Time: 1000-1100 SLP Individual Time Calculation (min): 60 min  Short Term Goals: Week 3: SLP Short Term Goal 1 (Week 3): Pt will tolerate presentations of his currently prescribed diet with no overt s/s of aspiration with min cues for use of swallowing precautions SLP Short Term Goal 2 (Week 3): Pt will tolerate trials of advanced consistencies to assess readiness for diet progression with no overt s/s of aspiration and min cues for use of swallowing precautions.  SLP Short Term Goal 3 (Week 3): Patient will recall and scan to the left of midline to locate items as needed, during basic functional tasks with Min mulitmodal cues  SLP Short Term Goal 4 (Week 3): Patient will produce intelligible speech at the phrase-sentence level of vebral expression with supervision cues to utilize speech intelligibilty strategies SLP Short Term Goal 5 (Week 3): Patient will label 2 physical and 2 cognitive deficits with Mod question cues SLP Short Term Goal 6 (Week 3): Patient will sustain attention to basic tasks for 3-5  minutes with Mod verbal cues for redirections  Skilled Therapeutic Interventions:   Pt was seen for skilled ST targeting cognitive goals.  Upon arrival, pt was reclined in bed, awake, alert, and agreeable to participate in ST with encouragement.  SLP provided supervision cues for initiation and sequencing to reposition himself in bed to maximize alertness, comfort, and attention during structured therapeutic tasks.  Additionally, SLP facilitated the session with a basic problem solving task targeting organization, sustained attention, and visual scanning to the left.  Pt required overall max assist verbal cues to complete the abovementioned task due to left inattention, decreased organization, and poor awareness of errors.  Pt asked  how these activities are helping his speech; therefore, SLP provided skilled education related to pt's current goals and the impact his cognitive deficits have on his ability to complete tasks independently.  Following education, pt was able to identify 2 ways in which his left inattention impact his functional safety awareness with mod question cues.  Continue per current plan of care.     FIM:  Comprehension Comprehension Mode: Auditory Comprehension: 5-Follows basic conversation/direction: With extra time/assistive device Expression Expression Mode: Verbal Expression: 5-Expresses basic 90% of the time/requires cueing < 10% of the time. Social Interaction Social Interaction: 4-Interacts appropriately 75 - 89% of the time - Needs redirection for appropriate language or to initiate interaction. Problem Solving Problem Solving: 3-Solves basic 50 - 74% of the time/requires cueing 25 - 49% of the time Memory Memory: 3-Recognizes or recalls 50 - 74% of the time/requires cueing 25 - 49% of the time  Pain Pain Assessment Pain Assessment: No/denies pain  Therapy/Group: Individual Therapy  Darianna Amy, Selinda Orion 03/08/2014, 4:35 PM

## 2014-03-08 NOTE — Progress Notes (Signed)
Physical Therapy Session Note  Patient Details  Name: Gregory Wiley MRN: [id number] Date of Birth: 04-26-1950  Today's Date: 03/08/2014 PT Individual Time: 1300-1400 PT Individual Time Calculation (min): 60 min   Short Term Goals: Week 3:  PT Short Term Goal 1 (Week 3): Pt will perform sit <> supine with total A x 1 with use of hospital bed functions with 25% cues for sequencing.  PT Short Term Goal 2 (Week 3): Patient will initiate transfer bed <> w/c with +2 assist and 25% cues for sequencing.  PT Short Term Goal 3 (Week 3): Patient will maintain static standing x 3 min.  PT Short Term Goal 4 (Week 3): Patient will initiate gait training with +2 assist.   Skilled Therapeutic Interventions/Progress Updates:   Pt received sitting in w/c in room, agreeable to therapy session.  Assisted pt to/from therapy gym via w/c at total A level in order to continue to address gait for upright posture, tolerance, postural control, NMR through LLE, and weight shifting to the R for increased LLE clearance.  Performed 10' x 1, 8' x 1 and another 5' x 1 via "three muskateer style" (pt assisting approx 40%) with PT on L assisting under axilla (also with L GiveMohr sling donned) for upright posture, forward weight shift over LLE during L stance, assist to advance LLE (pt with decent initiation of L hip flex), stabilizing L knee during stance (again noted intermittent quad control during stance), and therapist on R assisting with upright posture and increased weight shift to the R to decrease pushing.  Pt continued to have increased pain in L ankle today and in L shoulder and although distances were not as great today, did note better mechanics with activation in LLE and upright posture vs yesterday.  Pt then assisted back to room where he wanted to perform standing in order to get [facility] pants donned prior to rehab Christmas party.  Performed standing with mod A with 2 therapist for safety.  Wife assisted with pulling  pants up with cues for upright posture throughout.  Pt left in w/c with all needs in reach and quick release belt donned.   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

## 2014-03-09 ENCOUNTER — Inpatient Hospital Stay (HOSPITAL_COMMUNITY): Payer: 59 | Admitting: Physical Therapy

## 2014-03-09 ENCOUNTER — Encounter (HOSPITAL_COMMUNITY): Payer: 59 | Admitting: Occupational Therapy

## 2014-03-09 ENCOUNTER — Inpatient Hospital Stay (HOSPITAL_COMMUNITY): Payer: 59

## 2014-03-09 ENCOUNTER — Inpatient Hospital Stay (HOSPITAL_COMMUNITY): Payer: 59 | Admitting: Speech Pathology

## 2014-03-09 DIAGNOSIS — R414 Neurologic neglect syndrome: Secondary | ICD-10-CM

## 2014-03-09 MED ORDER — TROLAMINE SALICYLATE 10 % EX CREA: TOPICAL_CREAM | Freq: Two times a day (BID) | CUTANEOUS | Status: DC | PRN

## 2014-03-09 MED ORDER — MUSCLE RUB 10-15 % EX CREA
TOPICAL_CREAM | Freq: Two times a day (BID) | CUTANEOUS | Status: DC | PRN
  Administered 2014-03-11: 10:00:00 via TOPICAL
  Filled 2014-03-09: qty 85

## 2014-03-09 NOTE — Progress Notes (Signed)
Physical Therapy Session Note  Patient Details  Name: Gregory Wiley MRN: [id number] Date of Birth: 1951/02/08  Today's Date: 03/09/2014 PT Co-Treatment Time: 1440-1500 (Co-tx with OT for 88min total (1415-1500)) PT Co-Treatment Time Calculation (min): 20 min  Short Term Goals: Week 3:  PT Short Term Goal 1 (Week 3): Pt will perform sit <> supine with total A x 1 with use of hospital bed functions with 25% cues for sequencing.  PT Short Term Goal 2 (Week 3): Patient will initiate transfer bed <> w/c with +2 assist and 25% cues for sequencing.  PT Short Term Goal 3 (Week 3): Patient will maintain static standing x 3 min.  PT Short Term Goal 4 (Week 3): Patient will initiate gait training with +2 assist.   Skilled Therapeutic Interventions/Progress Updates:  Pt received semi-reclined in bed, asleep req mod cues to wake and encouragement to participate due to fatigue. Skilled co-tx with OT this session for emphasis on functional endurance, functional transfers and NMR. Pt req max A for t/f sup>sit EOB w/ use of bed rails, HOB slightly elevated and manual facilitation for trunk/pelvic weight shift with max cues for seq and use of hemi-techniques.   Strong emphasis this session on bed>w/c t/f, sit<>stand t/f and static>dynamic balance with use of Steady to facilitate active engagement of R UE+B LE. Pt req Ax2 persons for safety, but able to complete standing with overall min A. Pt engaged in placing ornaments on Christmas tree with R UE on R side to facilitate gross extension and lateral/sustained weight shift to R side. Pt only able to tolerate bouts of standing for approx. 62min x4 bouts with intermittent seated rest on Steady perch or w/c. Tolerance to standing limited by reports of increased pain in B knee. Encouraged B weightshifts to mildly alleviate pain in knees.    Pt practiced propelling w/c 75'x2 with R LE, req total A for pulling, but min-mod A for steering only when pushing in reverse  direction. Additional emphasis on pt propelling w/c in reverse direction with L LE to facilitate conc. Quad contraction. Pt req overall total A with minimal muscle activation felt and noted very poor sustained attention to task despite cues.   Pt left sitting in w/c at end of session with quick release belt in place, all needs in reach and wife in room.   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

## 2014-03-09 NOTE — Progress Notes (Signed)
Occupational Therapy Session Note  Patient Details  Name: Gregory Wiley MRN: [id number] Date of Birth: [birth year]  Today's Date: 03/09/2014 OT Co-Treatment Time:1415  - 1440 ( co treatment with PT)  25 minutes  Short Term Goals: Week 3:  OT Short Term Goal 1 (Week 3): Pt will perform UB dressing with min assist and mod instructional cueing.   OT Short Term Goal 2 (Week 3): Pt will roll to the right side in bed during LB selfcare with max assist and mod instructional cueing for sequencing. OT Short Term Goal 3 (Week 3): Pt will perform toilet transfers squat pivot to drop arm commode with max assist. OT Short Term Goal 4 (Week 3): Pt will integrate the LUE into bathing tasks as a stabilizer with max assist and min instructional cueing.    Skilled Therapeutic Interventions/Progress Updates:   Upon entering room, pt semi reclined in bed asleep with wife present in the room. Skilled co tx with PT this session with focus on functional transfers, NMR,  And endurance. Pt required Max A for Supine > sit to EOB with use of bed rails and manual assist for trunk. STEDY utilized during session for sit <> stand, static balance, and dynamic standing balance. Pt required 2 person assist for safety as pt impulsive at times such as sitting down without warning. Pt only requiring overall min A to stand from elevated seat of STEDY and OT assist with L UE for safety. Pt standing within STEDY in order to place ornaments on christmas tree with R UE to facilitate weight shift to R and returning to midline. Pt tolerating bouts of standing for 2 minutes x 4 with intermittent seated rest on elevated seat of STEDY or wheelchair. Pt reporting increased pain in B knee limiting tolerance to standing. Pt encouraged to make B weight shifts to decrease pain in knees. Pt seated in wheelchair for wheelchair locomotion. Please see PT note for details.    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

## 2014-03-09 NOTE — Progress Notes (Signed)
Occupational Therapy Session Note  Patient Details  Name: Gregory Wiley MRN: [id number] Date of Birth: [birth year]  Today's Date: 03/09/2014 OT Individual Time: 0800-0902 OT Individual Time Calculation (min): 62 min    Short Term Goals: Week 3:  OT Short Term Goal 1 (Week 3): Pt will perform UB dressing with min assist and mod instructional cueing.   OT Short Term Goal 2 (Week 3): Pt will roll to the right side in bed during LB selfcare with max assist and mod instructional cueing for sequencing. OT Short Term Goal 3 (Week 3): Pt will perform toilet transfers squat pivot to drop arm commode with max assist. OT Short Term Goal 4 (Week 3): Pt will integrate the LUE into bathing tasks as a stabilizer with max assist and min instructional cueing.    Skilled Therapeutic Interventions/Progress Updates:   Pt performed bathing and dressing during session.  Total assist for stand pivot transfer from the bed to rolling shower chair.  Pt performed shower in sitting.  Increased discomfort in his LEs secondary to the chair being too high causing his legs to go to sleep.  He needed overall mod assist for UB bathing and min assist LB bathing.  Did not attempt standing secondary to conditions and setup.  Pt needing mod instructional cueing for sequencing and initiating the shower.  Transferred stand pivot with +2 total assist (pt 40%) to the wheelchair from the shower chair.  Pt with increased anxiety during transfer stating he needed to sit.  He also would not let go of the grab bar which was on his right and keeping him from turning to the left to reach the seat.  Transitioned to the sink for dressing.  Pt with use of reacher for threading his shorts.  Even though he was cued to start dressing the left foot first, he still attempted to perform the right.  He also continues to attempt to thread the left arm the opposite direction up the shirt instead of the correct way.  He required assistance with brushing his  hair this am secondary to tangles and just washing it.    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

## 2014-03-09 NOTE — Patient Care Conference (Signed)
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 Left-sided neglect 03/09/2014  . Dysphagia, pharyngoesophageal phase 03/02/2014  . Arthralgia of left ankle 02/24/2014  . Hemiplegia affecting left dominant side 02/16/2014  . 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 Benign neoplasm of colon 02/10/2013  . Special screening for malignant neoplasms, colon 02/10/2013  . OSA on CPAP 01/06/2013  . S/P angioplasty with DES to CFX 07/01/11 07/02/2011  . Sleep apnea, "cant afford C-pap" 07/02/2011  . CAD, RCA PCI '09, 10/11, CABG X 4 5/12 06/30/2011  . Hypertension, B/P has been low this admission 06/30/2011  . Hyperlipidemia 06/30/2011  . Morbid obesity 06/30/2011  . NSTEMI, 06/29/11 06/30/2011  . 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 Ischemic cardiomyopathy, EF 35-40 2D May 2012 06/30/2011  . CHF, acute, mild 06/30/2011    Expected Discharge Date:    Team Members Present: Physician leading conference: Dr. Alysia Penna Social Worker Present: Ovidio Kin, LCSW Nurse Present: Elliot Cousin, RN PT Present: Cameron Sprang, PT OT Present: Benay Pillow, OT SLP Present: Windell Moulding, SLP PPS Coordinator present : Daiva Nakayama, RN, CRRN     Current Status/Progress Goal Weekly Team Focus  Medical   Slow progress, increasing spasticity in the left  upper extremity, still requiring intermittent catheterizations.  DC'd at SNF with a assist level of 1 person  Spasticity management left upper extremity, urinary retention   Bowel/Bladder   Incontinent of bowel. Pt Last BM 12-18 with suppository given then. Pt voids small amounts at times; I&O cath q8hrs- moderate to large voulmes  Manage bowel and bladder wtih max assist.   establish regular bowel moveements; I&O cath q 8 hrs as ordered and encourage voiding   Swallow/Nutrition/ Hydration   Dys 3 textures, thin liquids, full supervision for use of swallowing precautions.   Min assist with least restrictive diet   continue to work towards diet liberalization, ongoing assessment of diet tolerance, and family education    ADL's   Bathing with min assist for UB as well as UB dressing.  Total assist +2 (pt 40%) for LB bathing and dressing sit to stand.  He is able to perform sit to stand with mod assist overall from higher surface.  Still needs total assist +2 for stand pivot transfers or sliding board transfers.  Left ankle pain inmproved but still bothers him some along with left shoulder pain.  Brunnstrum stage I in the right hand and arm.    Max assist for transfers, toileting, and LB selfcare sit to stand.  Min assist to supervision for UB selfcare  selfcare re-training, neuromuscular re-education, pt/family education, balance re-training   Mobility   +2 assist for bed mobility, +2 for safety for lateral scoot transfers, +2 for stand pivot transfers, dynamic standing and gait.  continue to note that he self limits himself during therapies.  max A x 1 except S for w/c mobility  lateral scoot transfers, stand pivot transfers, bed mobility, sit<>stand, gait, pt/family education.    Communication   min-mod assist   min assist   continue to improve vocal intensity and awareness of speech errors    Safety/Cognition/ Behavioral Observations  Min-mod assist   min assist   continue to improve left  attention, sustained attention,and basic problem solving   Pain   pain to left ankle secondary to gout- air cast and prn medications inpalce  < or =3   assess pain q shift medicate as needed   Skin   healed incision to left chest from loop recorder  No new skin breakdown with mod assist  assess skin q shift and turn q 2 hrs      *See Care Plan and progress notes for long and short-term goals.  Barriers to Discharge: Heavy assist level,, poor motivation    Possible Resolutions to Barriers:  Continue rehabilitation, continue SNF search    Discharge Planning/Teaching Needs:  Currently looking for NH bed, pt and wife aware and [employer] pushing now to move to next venue      Team Discussion:  Progress is slow and pt limits himself due to pain and fear of falling. Diet upgrade to Dys 2 thin liquids. Limited carryover due to cognition issues. Still I & O caths-question foley at NH. Improved left inattention  Revisions to Treatment Plan:  Bed search for NH   Continued Need for Acute Rehabilitation Level of Care: The patient requires daily medical management by a physician with specialized training in physical medicine and rehabilitation for the following conditions: Daily direction of a multidisciplinary physical rehabilitation program to ensure safe treatment while eliciting the highest outcome that is of practical value to the patient.: Yes Daily medical management of patient stability for increased activity during participation in an intensive rehabilitation regime.: Yes Daily analysis of laboratory values and/or radiology reports with any subsequent need for medication adjustment of medical intervention for : Neurological problems  [name], Gardiner Rhyme 03/09/2014, 1:48 PM

## 2014-03-09 NOTE — Progress Notes (Signed)
Subjective/Complaints: Per OT increasing tone in Left hand and elbow, overall ankle pain better  Review of Systems - Negative except above    Poor awareness of deficits secondary to R MCA lg infarct Objective: Vital Signs: Blood pressure 105/75, pulse 0, temperature 97.7 F (36.5 C), temperature source Oral, resp. rate 18, height 6' 1" (1.854 m), weight 147.9 kg (326 lb 1 oz), SpO2 95 %. No results found. Results for orders placed or performed during the hospital encounter of 02/15/14 (from the past 72 hour(s))  Glucose, capillary     Status: Abnormal   Collection Time: 03/07/14 10:24 PM  Result Value Ref Range   Glucose-Capillary 104 (H) 70 - 99 mg/dL     HEENT: normal Cardio: RRR and no murmur Resp: CTA B/L and unlabored GI: BS positive and NT, ND Extremity:  Pulses positive and No Edema Skin:   Intact Neuro: Alert/Oriented, Cranial Nerve Abnormalities Left central 7, Normal Sensory, Abnormal Motor 0/5 LUE and LLE persistent. Does sense pain. , Dysarthric and left Inattention, right gaze preference better MAS 2-3 Left elbow flexors  Musc/Skel:  Other no pain with Left shoulder ROM MSK- no pain with ankle DF/PF Gen NAD   Assessment/Plan: 1. 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 Physiatrist is providing close team supervision and 24 hour management of active medical problems listed below. Physiatrist and rehab team continue to assess barriers to discharge/monitor patient progress toward functional and medical goals. Team conference today please see physician documentation under team conference tab, met with team face-to-face to discuss problems,progress, and goals. Formulized individual treatment plan based on medical history, underlying problem and comorbidities.     FIM: FIM - Bathing Bathing  Steps Patient Completed: Chest, Abdomen, Right upper leg, Left Arm, Front perineal area, Left upper leg, Right lower leg (including foot), Left lower leg (including foot) Bathing: 1: Two helpers  FIM - Upper Body Dressing/Undressing Upper body dressing/undressing steps patient completed: Thread/unthread right sleeve of pullover shirt/dresss, Put head through opening of pull over shirt/dress, Pull shirt over trunk Upper body dressing/undressing: 4: Min-Patient completed 75 plus % of tasks FIM - Lower Body Dressing/Undressing Lower body dressing/undressing steps patient completed: Thread/unthread right pants leg, Thread/unthread right underwear leg Lower body dressing/undressing: 1: Two helpers  FIM - Toileting Toileting: 0: Activity did not occur  FIM - Air cabin crew Transfers: 0-Activity did not occur  FIM - Control and instrumentation engineer Devices: Sliding board Bed/Chair Transfer: 1: Two helpers  FIM - Locomotion: Wheelchair Distance: 100 Locomotion: Wheelchair: 0: Activity did not occur FIM - Locomotion: Ambulation Locomotion: Ambulation Assistive Devices: Other (comment), Orthosis (three muskateer, shoe cover and L ace wrap) Ambulation/Gait Assistance: 1: +2 Total assist Locomotion: Ambulation: 0: Activity did not occur  Comprehension Comprehension Mode: Auditory Comprehension: 5-Follows basic conversation/direction: With no assist  Expression Expression Mode: Verbal Expression: 5-Expresses basic needs/ideas: With no assist  Social Interaction Social Interaction: 4-Interacts appropriately 75 - 89% of the time - Needs redirection for appropriate language or to initiate interaction.  Problem Solving Problem Solving: 4-Solves basic 75 - 89% of the time/requires cueing 10 - 24% of the time  Memory Memory: 4-Recognizes or recalls 75 - 89% of the time/requires cueing 10 - 24% of the time  Medical Problem List and Plan: 1. Functional deficits  secondary to Right MCA infarct with left hemiparesis, dysarthria, dysphagia, and  visual spatial deficits- increasing tone in LUE add resting hand splint 2. DVT Prophylaxis/Anticoagulation: Pharmaceutical: Lovenox 3. New onset Headaches/Pain Management: Continue tylenol prn for now.Left ankle OA, tramadol 100mg prior to therapy, avoid NSAIDs secondary to recent CVA, overall improved 5. Neuropsych: This patient is not capable of making decisions on his own behalf. 6. Skin/Wound Care: Routine pressure relief measures. turn q2 h 7. Fluids/Electrolytes/Nutrition: Monitor I/O.Hold IVF, recheck BMET on 12/18 was nl 8. HTN: Will monitor every 8 hours. Currently controlled of medications (and to avoid hypotension). will resume medications as indicated. flomax may lower BP   10 OSA: Continue CPAP (has home machine) whenever asleep. poor compliance, discussed increase CVA risk, ? desat at noc 11. Recent esophagitis/duodenitis due to gastric ulcer: Protonix bid to help promote healing 12. CAD: Has chest wall pain due to poorly Healed sternum. Continue ASA, plavix, lipitor--off imdur and coreg  13.  Left ankle pain with ROM and wt bearing, non progressive, exam with minimal swelling, no erythema, not consistent, xray with mod OA, tramadol is helping but makes him drowsy, change to ultracet, avoid NSAID secondary to recent CVA 14.   UTI , enterococcus, sens to amp will cont  amoxil LOS (Days) 22 A FACE TO FACE EVALUATION WAS PERFORMED  KIRSTEINS,ANDREW E 03/09/2014, 8:55 AM    

## 2014-03-09 NOTE — Plan of Care (Signed)
Problem: RH BOWEL ELIMINATION Goal: RH STG MANAGE BOWEL WITH ASSISTANCE STG Manage Bowel with Mod Assistance.  Outcome: Progressing LBM 03/07/2014  Problem: RH BLADDER ELIMINATION Goal: RH STG MANAGE BLADDER WITH ASSISTANCE STG Manage Bladder With Total Assistance  Outcome: Progressing Q 8 hr straight cath

## 2014-03-09 NOTE — Progress Notes (Signed)
Physical Therapy Session Note  Patient Details  Name: Gregory Wiley MRN: [id number] Date of Birth: 1950/03/26  Today's Date: 03/09/2014 PT Individual Time: 1115-1200 PT Individual Time Calculation (min): 45 min   Short Term Goals: Week 3:  PT Short Term Goal 1 (Week 3): Pt will perform sit <> supine with total A x 1 with use of hospital bed functions with 25% cues for sequencing.  PT Short Term Goal 2 (Week 3): Patient will initiate transfer bed <> w/c with +2 assist and 25% cues for sequencing.  PT Short Term Goal 3 (Week 3): Patient will maintain static standing x 3 min.  PT Short Term Goal 4 (Week 3): Patient will initiate gait training with +2 assist.   Skilled Therapeutic Interventions/Progress Updates:    2:1. Pt received seated in w/c accompanied by wife; agreeable to therapy. Session focused on increasing pt safety/stability with gait. Donned the following on L side: air cast on ankle, ACE bandage for dorsiflexion assist, shoe cover to decrease friction, and GivMohr sling. Transported pt to hallway in w/c with total A for energy conservation. Performed gait 2 trials x38' (seated rest break between trials) with +2A (3 musketeers assist) and close w/c follow. Therapist positioned at pt's L side provided manual facilitation of lateral weight shift to R side, RLE advancement, R knee stabilization during RLE stance, and tactile cueing for L trunk elongation. Therapist positioned at R side manually facilitated upright posture, control of lateral weight shift to L side before/during RLE advancement. Tactile cueing requiring during first gait trial to initiate RLE advancement. Both ambulation trials ended due to onset of pain in L ankle. Session ended in pt room, where pt was left reclined in tilt-in-space w/c with quick release belt in place for safety, wife present, and all needs within reach.  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

## 2014-03-09 NOTE — Progress Notes (Signed)
Orthopedic Tech Progress Note Patient Details:  Gregory Wiley [birth year] [id number]  Patient ID: Gregory Wiley, male   DOB: 09-27-1950, 63 y.o.   MRN: [id number]  Called in advanced brace order; spoke with Gregory Wiley, Gregory Wiley 03/09/2014, 12:09 PM

## 2014-03-09 NOTE — Progress Notes (Signed)
Social Work Patient ID: Gregory Wiley, male   DOB: [birth year], 63 y.o.   MRN: [id number] Met with pt and wife to discuss team conference progress and search for NH bed.  [facility] said no beds and reluctance to take pt.  Asked other preferences-pt reports [facility]. Wife wanted [facility] and informed her to contact them to see if would reconsider. Looking for NH bed, pt is making progress and improving.

## 2014-03-09 NOTE — Progress Notes (Signed)
Speech Language Pathology Daily Session Note  Patient Details  Name: Gregory Wiley MRN: [id number] Date of Birth: 11-Jul-1950  Today's Date: 03/09/2014 SLP Individual Time: 1000-1030 SLP Individual Time Calculation (min): 30 min  Short Term Goals: Week 3: SLP Short Term Goal 1 (Week 3): Pt will tolerate presentations of his currently prescribed diet with no overt s/s of aspiration with min cues for use of swallowing precautions SLP Short Term Goal 2 (Week 3): Pt will tolerate trials of advanced consistencies to assess readiness for diet progression with no overt s/s of aspiration and min cues for use of swallowing precautions.  SLP Short Term Goal 3 (Week 3): Patient will recall and scan to the left of midline to locate items as needed, during basic functional tasks with Min mulitmodal cues  SLP Short Term Goal 4 (Week 3): Patient will produce intelligible speech at the phrase-sentence level of vebral expression with supervision cues to utilize speech intelligibilty strategies SLP Short Term Goal 5 (Week 3): Patient will label 2 physical and 2 cognitive deficits with Mod question cues SLP Short Term Goal 6 (Week 3): Patient will sustain attention to basic tasks for 3-5  minutes with Mod verbal cues for redirections  Skilled Therapeutic Interventions: Skilled treatment session focused on cognitive goals. Upon arrival, patient was awake while sitting reclined in his wheelchair and was agreeable to participate in treatment session. SLP facilitated session by providing Min-Mod A question cues for intellectual awareness of both his physical and cognitive deficits and goals of skilled SLP intervention. A visual aid was also created to reinforce information. Patient became tearful during the session and verbalized frustration with his current situation and slow progress, this clinician provided emotional support and encouragement. Patient left in wheelchair with quick release belt in place and all needs  within reach. Continue with current plan of care.    FIM:  Comprehension Comprehension Mode: Auditory Comprehension: 5-Follows basic conversation/direction: With no assist Expression Expression Mode: Verbal Expression: 5-Expresses basic needs/ideas: With no assist Social Interaction Social Interaction: 4-Interacts appropriately 75 - 89% of the time - Needs redirection for appropriate language or to initiate interaction. Problem Solving Problem Solving: 3-Solves basic 50 - 74% of the time/requires cueing 25 - 49% of the time Memory Memory: 3-Recognizes or recalls 50 - 74% of the time/requires cueing 25 - 49% of the time  Pain No/Denies Pain    Therapy/Group: Individual Therapy  Yarah Fuente 03/09/2014, 1:04 PM

## 2014-03-10 ENCOUNTER — Ambulatory Visit (HOSPITAL_COMMUNITY): Payer: 59 | Admitting: Rehabilitation

## 2014-03-10 ENCOUNTER — Inpatient Hospital Stay (HOSPITAL_COMMUNITY): Payer: 59 | Admitting: Speech Pathology

## 2014-03-10 ENCOUNTER — Encounter: Payer: Self-pay | Admitting: Gastroenterology

## 2014-03-10 ENCOUNTER — Encounter (HOSPITAL_COMMUNITY): Payer: 59 | Admitting: Occupational Therapy

## 2014-03-10 NOTE — Progress Notes (Signed)
Subjective/Complaints: Left wrist splint uncomfortable Discussed bladder retntion, pt states he has some sensation  Review of Systems - Negative except above    Poor awareness of deficits secondary to R MCA lg infarct Objective: Vital Signs: Blood pressure 115/69, pulse 75, temperature 97.7 F (36.5 C), temperature source Oral, resp. rate 18, height 6\' 1"  (1.854 m), weight 147.9 kg (326 lb 1 oz), SpO2 96 %. No results found. Results for orders placed or performed during the hospital encounter of 02/15/14 (from the past 72 hour(s))  Glucose, capillary     Status: Abnormal   Collection Time: 03/07/14 10:24 PM  Result Value Ref Range   Glucose-Capillary 104 (H) 70 - 99 mg/dL     HEENT: normal Cardio: RRR and no murmur Resp: CTA B/L and unlabored GI: BS positive and NT, ND Extremity:  Pulses positive and No Edema Skin:   Intact Neuro: Alert/Oriented, Cranial Nerve Abnormalities Left central 7, Normal Sensory, Abnormal Motor 0/5 LUE and LLE persistent. Does sense pain. , Dysarthric and left Inattention, right gaze preference better MAS 2-3 Left elbow flexors , MAS 0 in FF    Musc/Skel:  Other no pain with Left shoulder ROM MSK- mild pain in Left shoulder Gen NAD   Assessment/Plan: 1. 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 Physiatrist is providing close team supervision and 24 hour management of active medical problems listed below. Physiatrist and rehab team continue to assess barriers to discharge/monitor patient progress toward functional and medical goals.      FIM: FIM - Bathing Bathing Steps Patient Completed: Chest, Abdomen, Right upper leg, Left Arm, Left upper leg, Right lower leg (including foot), Left lower leg (including foot) Bathing: 1: Two helpers  FIM - Upper Body  Dressing/Undressing Upper body dressing/undressing steps patient completed: Thread/unthread right sleeve of pullover shirt/dresss, Put head through opening of pull over shirt/dress, Pull shirt over trunk Upper body dressing/undressing: 4: Min-Patient completed 75 plus % of tasks FIM - Lower Body Dressing/Undressing Lower body dressing/undressing steps patient completed: Thread/unthread right pants leg Lower body dressing/undressing: 1: Two helpers  FIM - Toileting Toileting: 0: Activity did not occur  FIM - Air cabin crew Transfers: 0-Activity did not occur  FIM - Control and instrumentation engineer Devices: Bed rails, HOB elevated, Arm rests (Steady) Bed/Chair Transfer: 2: Supine > Sit: Max A (lifting assist/Pt. 25-49%), 1: Mechanical lift, 1: Two helpers  FIM - Locomotion: Wheelchair Distance: 100 Locomotion: Wheelchair: 1: Total Assistance/staff pushes wheelchair (Pt<25%) FIM - Locomotion: Ambulation Locomotion: Ambulation Assistive Devices: Other (comment), Orthosis (L air cast, ACE wrap for dorsiflexion assist,  L shoe cover, L GivMohr sling) Ambulation/Gait Assistance: 1: +2 Total assist (3 musketeers and close w/c follow) Locomotion: Ambulation: 0: Activity did not occur  Comprehension Comprehension Mode: Auditory Comprehension: 5-Follows basic conversation/direction: With no assist  Expression Expression Mode: Verbal Expression: 5-Expresses basic needs/ideas: With no assist  Social Interaction Social Interaction: 4-Interacts appropriately 75 - 89% of the time - Needs redirection for appropriate language or to initiate interaction.  Problem Solving Problem Solving: 3-Solves basic 50 - 74% of the time/requires cueing 25 - 49% of the time  Memory Memory: 3-Recognizes or recalls 50 - 74% of the time/requires cueing 25 - 49% of the time  Medical Problem List and Plan: 1. Functional deficits secondary to Right MCA infarct with left hemiparesis,  dysarthria, dysphagia, and  visual spatial deficits- increasing tone in LUE add resting hand splint 2. DVT Prophylaxis/Anticoagulation: Pharmaceutical: Lovenox 3. New onset Headaches/Pain Management: Continue tylenol prn for now.Left ankle OA, tramadol 100mg  prior to therapy, avoid NSAIDs secondary to recent CVA, overall improved 5. Neuropsych: This patient is not capable of making decisions on his own behalf. 6. Skin/Wound Care: Routine pressure relief measures. turn q2 h 7. Fluids/Electrolytes/Nutrition: Monitor I/O.Hold IVF, recheck BMET on 12/18 was nl 8. HTN: Will monitor every 8 hours. Currently controlled of medications (and to avoid hypotension). will resume medications as indicated. flomax may lower BP   10 OSA: Continue CPAP (has home machine) whenever asleep. poor compliance, discussed increase CVA risk, ? desat at noc 11. Recent esophagitis/duodenitis due to gastric ulcer: Protonix bid to help promote healing 12. CAD: Has chest wall pain due to poorly Healed sternum. Continue ASA, plavix, lipitor--off imdur and coreg  13.  Left ankle pain with ROM and wt bearing, non progressive, exam with minimal swelling, no erythema, not consistent, xray with mod OA, tramadol is helping but makes him drowsy, change to ultracet, avoid NSAID secondary to recent CVA 14.   UTI , enterococcus, sens to amp will cont  Amoxil, likely related to urinary retention LOS (Days) 23 A FACE TO FACE EVALUATION WAS PERFORMED  KIRSTEINS,ANDREW E 03/10/2014, 6:49 AM

## 2014-03-10 NOTE — Plan of Care (Signed)
Problem: RH BLADDER ELIMINATION Goal: RH STG MANAGE BLADDER WITH ASSISTANCE STG Manage Bladder With Total Assistance  Outcome: Not Progressing Patient needs to be catheterized every 6-8 hours

## 2014-03-10 NOTE — Progress Notes (Signed)
Occupational Therapy Session Note  Patient Details  Name: Gregory Wiley MRN: [id number] Date of Birth: 09/10/1950  Today's Date: 03/10/2014 OT Individual Time: 0901-1001 OT Individual Time Calculation (min): 60 min    Short Term Goals: Week 3:  OT Short Term Goal 1 (Week 3): Pt will perform UB dressing with min assist and mod instructional cueing.   OT Short Term Goal 2 (Week 3): Pt will roll to the right side in bed during LB selfcare with max assist and mod instructional cueing for sequencing. OT Short Term Goal 3 (Week 3): Pt will perform toilet transfers squat pivot to drop arm commode with max assist. OT Short Term Goal 4 (Week 3): Pt will integrate the LUE into bathing tasks as a stabilizer with max assist and min instructional cueing.    Skilled Therapeutic Interventions/Progress Updates:    Bathing and dressing sit to stand on the EOB.  Pt able to stand with max assist of one person to perform clothing management when removing shorts and donning new ones.  Still needed +2 assist (pt 40%) for washing peri area as he was not able to adequately reach it.  LH sponge and reacher also provided for washing his feet as well as donning his shorts.  Max assist needed to lift the LLE to start his shorts with mod instructional cueing to begin by starting on the left.  Total assist for integration of the LUE into the task for washing as well.  Slight increased flexor tone noted in the left elbow and fingers at beginning of session but not present after pt sat up.  Max assist for transfer stand pivot to the bedside chair to complete grooming tasks.  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

## 2014-03-10 NOTE — Progress Notes (Signed)
Physical Therapy Weekly Progress Note  Patient Details  Name: Gregory Wiley MRN: [id number] Date of Birth: 07-08-1950  Beginning of progress report period: March 03, 2014 End of progress report period: March 10, 2014  Today's Date: 03/10/2014 PT Co-Treatment Time: 1430 (whole session 1400-1500 w/ OT)-1500 PT Co-Treatment Time Calculation (min): 30 min  Patient has met 3 of 4 short term goals.  Pt making slow but steady progress with all aspects of mobility.  Pt continues to be extremely self limiting during therapy and also is still limited by pain in Achilles tendon area.  Feel he may need continuous stretching and perhaps custom brace to keep foot better locked in neutral position.  Pt now able to perform standing with as little as mod A, stand pivot transfers with +2 assist (max total), and have initiated gait with +3 assist for chair follow x 38'.  Continue to feel that he will need SNF level of care at time of D/C.   Patient continues to demonstrate the following deficits: decreased balance, decreased awareness, decreased motivation, decreased functional use of LUE/LE and therefore will continue to benefit from skilled PT intervention to enhance overall performance with activity tolerance, balance, postural control, ability to compensate for deficits, functional use of  left upper extremity and left lower extremity, attention, awareness, coordination and knowledge of precautions.  Patient progressing toward long term goals..  Continue plan of care.  PT Short Term Goals Week 3:  PT Short Term Goal 1 (Week 3): Pt will perform sit <> supine with total A x 1 with use of hospital bed functions with 25% cues for sequencing.  PT Short Term Goal 1 - Progress (Week 3): Met PT Short Term Goal 2 (Week 3): Patient will initiate transfer bed <> w/c with +2 assist and 25% cues for sequencing.  PT Short Term Goal 2 - Progress (Week 3): Progressing toward goal PT Short Term Goal 3 (Week 3): Patient  will maintain static standing x 3 min.  PT Short Term Goal 3 - Progress (Week 3): Met PT Short Term Goal 4 (Week 3): Patient will initiate gait training with +2 assist.  PT Short Term Goal 4 - Progress (Week 3): Met Week 4:  PT Short Term Goal 1 (Week 4): Pt will perform sit <> supine with max A x 1 with use of hospital bed functions with 25% cues for sequencing.  PT Short Term Goal 2 (Week 4): Patient will initiate transfer bed <> w/c with total A and 25% cues for sequencing.  PT Short Term Goal 3 (Week 4): Patient will maintain static standing x 5 mins at mod A PT Short Term Goal 4 (Week 4): Pt will ambulate x 45' consistently with +2 assist (+3 for chair follow)  Skilled Therapeutic Interventions/Progress Updates:    Co-tx with OT focus on dynamic standing balance, RLE positioning and WB, standing posture, and gait. Performed bed mobility with HOB slightly elevated and use of bed rails at mod/max A level.  Transferred via squat pivot to w/c at mod A level with continued cues for forward weight shift.  Pt worked on sit to stand from the wheelchair with total assist +2 (pt 60%). Used "3 muskateers" position to work on mobility with left foot ace wrapped and shoe cover in place for DF assist and better LLE clearance. Pt able to walk 3 times during session 68' x 1, 108' x 2 reps. Max facilitation needed for advancing the LLE and for maintaining knee extension in stance  phase, as well as assisting to prevent hyperextension as this would lead to increased pain in L achilles area. Also requires facilitation for forward weight shift over LLE during L stance and increased weight shift to the R for better clearance of LLE.  Max instructional cueing for larger step with the RLE and to maintain upright posture with cervical extension during mobility. Pt with Mohr sling in place to support the LUE during mobility. Pt's wife present for session and provided visual reference for pt to maintain cervical extension  as well as verbal encouragement. Pt assisted back to room and left in w/c with quick release belt donned and all needs in reach.   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

## 2014-03-10 NOTE — Progress Notes (Signed)
Occupational Therapy Session Note  Patient Details  Name: RAJON BISIG MRN: [id number] Date of Birth: Feb 06, 1951  Today's Date: 03/10/2014 OT Co-Treatment Time: 1400-1430 OT Co-Treatment Time Calculation (min): 30 min   Skilled Therapeutic Interventions/Progress Updates:    Co-tx with PT focus on dynamic standing balance, RUE positioning, standing posture, and mobility.  Pt worked on sit to stand from the wheelchair with total assist +2 (pt 60%).  Used "3 muskateers" position to work on mobility with left foot acewrapped and shoe cover in place.  Pt able to walk 3 times during session (see PT notes for distance details).  Max facilitation needed for advancing the LLE and for maintaining knee extension in stance phase.  Max instructional cueing for larger step with the RLE and to maintain upright posture with cervical extension during mobility.  Pt with Mohr sling in place to support the LUE during mobility.  Pt's wife present for session and provided visual reference for pt to maintain cervical extension as well as verbal encouragement.    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

## 2014-03-10 NOTE — Progress Notes (Signed)
Speech Language Pathology Weekly Progress and Session Note  Patient Details  Name: Gregory Wiley MRN: [id number] Date of Birth: [birth year]  Beginning of progress report period: March 03, 2014 End of progress report period: March 10, 2014  Today's Date: 03/10/2014 SLP Individual Time: 1030-1130 SLP Individual Time Calculation (min): 60 min  Short Term Goals: Week 3: SLP Short Term Goal 1 (Week 3): Pt will tolerate presentations of his currently prescribed diet with no overt s/s of aspiration with min cues for use of swallowing precautions SLP Short Term Goal 1 - Progress (Week 3): Met SLP Short Term Goal 2 (Week 3): Pt will tolerate trials of advanced consistencies to assess readiness for diet progression with no overt s/s of aspiration and min cues for use of swallowing precautions.  SLP Short Term Goal 2 - Progress (Week 3): Progressing toward goal SLP Short Term Goal 3 (Week 3): Patient will recall and scan to the left of midline to locate items as needed, during basic functional tasks with Min mulitmodal cues  SLP Short Term Goal 3 - Progress (Week 3): Progressing toward goal SLP Short Term Goal 4 (Week 3): Patient will produce intelligible speech at the phrase-sentence level of vebral expression with supervision cues to utilize speech intelligibilty strategies SLP Short Term Goal 4 - Progress (Week 3): Met SLP Short Term Goal 5 (Week 3): Patient will label 2 physical and 2 cognitive deficits with Mod question cues SLP Short Term Goal 5 - Progress (Week 3): Met SLP Short Term Goal 6 (Week 3): Patient will sustain attention to basic tasks for 3-5  minutes with Mod verbal cues for redirections SLP Short Term Goal 6 - Progress (Week 3): Met    New Short Term Goals: Week 4: SLP Short Term Goal 1 (Week 4): Pt will demonstrate effective mastication of regular consistencies with no overt s/s of aspiration and Min cues for use of swallowing strategies.  SLP Short Term Goal 2 (Week 4):  Patient will attend to left of midline and left upper extremity during basic functional tasks with Min mulitmodal cues  SLP Short Term Goal 3 (Week 4): Patient will label 2 physical and 2 cognitive deficits with Min question cues SLP Short Term Goal 4 (Week 4): Patient will sustain attention to basic tasks for 5-8  minutes with Mod verbal cues for redirections  Weekly Progress Updates: Patient has made functional gains and has met 4 out of 6 short term goals this reporting period due to improved ability to sustain attention for a few minutes, consume Dys. 3 textures and thin liquids and identify current deficits with Mod clinician cues.  Patient also requires Supervision cues to produce intelligible speech.  Patient and family education is ongoing; however, wife will be unable to care for husband after discharge.  Patient would benefit from continued skilled SLP intervention to maximize functional abilities prior to discharge to SNF.    Intensity: Minumum of 1-2 x/day, 30 to 90 minutes Frequency: 5 out of 7 days Duration/Length of Stay: 12/30 to SNF Treatment/Interventions: Cognitive remediation/compensation;Cueing hierarchy;Dysphagia/aspiration precaution training;Environmental controls;Functional tasks;Internal/external aids;Patient/family education;Oral motor exercises;Speech/Language facilitation;Therapeutic Activities   Daily Session  Skilled Therapeutic Interventions: Skilled treatment session focused on addressing dysarthria and cognitive goals.  SLP attempted skilled observation of current recommended diet; however, patient declined but wife reports that he is did well with the advanced textures.  SLP facilitated session with a structured reading task and Supervision level verbal cues to decrease rate and increase vocal intensity for overall improved  intelligibility.  Patient also required Supervision level verbal cues to attend to the left during the reading task and cues were increased to  Mod assist for left attention to the environment and upper extremity.        FIM:  Comprehension Comprehension Mode: Auditory Comprehension: 5-Follows basic conversation/direction: With extra time/assistive device Expression Expression Mode: Verbal Expression: 5-Expresses basic 90% of the time/requires cueing < 10% of the time. Social Interaction Social Interaction: 4-Interacts appropriately 75 - 89% of the time - Needs redirection for appropriate language or to initiate interaction. Problem Solving Problem Solving: 3-Solves basic 50 - 74% of the time/requires cueing 25 - 49% of the time Memory Memory: 3-Recognizes or recalls 50 - 74% of the time/requires cueing 25 - 49% of the time General    Pain Pain Assessment Pain Assessment: Faces Pain Type: Acute pain Pain Location: Ankle Pain Orientation: Left Pain Intervention(s): Repositioned  Therapy/Group: Individual Therapy  Carmelia Roller., CCC-SLP [phone]  [place] 03/11/2014, 8:18 PM

## 2014-03-11 LAB — CBC
HCT: 36.9 % — ABNORMAL LOW (ref 39.0–52.0)
Hemoglobin: 11.9 g/dL — ABNORMAL LOW (ref 13.0–17.0)
MCH: 27.2 pg (ref 26.0–34.0)
MCHC: 32.2 g/dL (ref 30.0–36.0)
MCV: 84.2 fL (ref 78.0–100.0)
Platelets: 219 10*3/uL (ref 150–400)
RBC: 4.38 MIL/uL (ref 4.22–5.81)
RDW: 13.7 % (ref 11.5–15.5)
WBC: 5.9 10*3/uL (ref 4.0–10.5)

## 2014-03-11 NOTE — Progress Notes (Signed)
Subjective/Complaints: Wants to be turned  Review of Systems - Negative except above    Poor awareness of deficits secondary to R MCA lg infarct Objective: Vital Signs: Blood pressure 105/62, pulse 66, temperature 98.4 F (36.9 C), temperature source Oral, resp. rate 18, height 6\' 1"  (1.854 m), weight 144.108 kg (317 lb 11.2 oz), SpO2 94 %. No results found. Results for orders placed or performed during the hospital encounter of 02/15/14 (from the past 72 hour(s))  CBC     Status: Abnormal   Collection Time: 03/11/14  6:12 AM  Result Value Ref Range   WBC 5.9 4.0 - 10.5 K/uL   RBC 4.38 4.22 - 5.81 MIL/uL   Hemoglobin 11.9 (L) 13.0 - 17.0 g/dL   HCT 36.9 (L) 39.0 - 52.0 %   MCV 84.2 78.0 - 100.0 fL   MCH 27.2 26.0 - 34.0 pg   MCHC 32.2 30.0 - 36.0 g/dL   RDW 13.7 11.5 - 15.5 %   Platelets 219 150 - 400 K/uL     HEENT: normal Cardio: RRR and no murmur Resp: CTA B/L and unlabored GI: BS positive and NT, ND Extremity:  Pulses positive and No Edema Skin:   Intact Neuro: Alert/Oriented, Cranial Nerve Abnormalities Left central 7, Normal Sensory, Abnormal Motor 0/5 LUE and LLE persistent. Does sense pain. , Dysarthric and left Inattention, right gaze preference better MAS 2-3 Left elbow flexors , MAS 0 in FF    Musc/Skel:  Other no pain with Left shoulder ROM MSK- mild pain in Left shoulder Gen NAD   Assessment/Plan: 1. 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 Physiatrist is providing close team supervision and 24 hour management of active medical problems listed below. Physiatrist and rehab team continue to assess barriers to discharge/monitor patient progress toward functional and medical goals.      FIM: FIM - Bathing Bathing Steps Patient Completed: Chest, Abdomen, Right upper leg, Left Arm, Left  upper leg, Right lower leg (including foot), Left lower leg (including foot) Bathing: 2: Max-Patient completes 3-4 40f 10 parts or 25-49%  FIM - Upper Body Dressing/Undressing Upper body dressing/undressing steps patient completed: Thread/unthread right sleeve of pullover shirt/dresss, Put head through opening of pull over shirt/dress, Pull shirt over trunk, Thread/unthread left sleeve of pullover shirt/dress Upper body dressing/undressing: 4: Min-Patient completed 75 plus % of tasks FIM - Lower Body Dressing/Undressing Lower body dressing/undressing steps patient completed: Thread/unthread right pants leg Lower body dressing/undressing: 2: Max-Patient completed 25-49% of tasks  FIM - Toileting Toileting: 0: Activity did not occur  FIM - Air cabin crew Transfers: 0-Activity did not occur  FIM - Control and instrumentation engineer Devices: Bed rails, HOB elevated, Arm rests (Steady) Bed/Chair Transfer: 2: Supine > Sit: Max A (lifting assist/Pt. 25-49%), 2: Bed > Chair or W/C: Max A (lift and lower assist)  FIM - Locomotion: Wheelchair Distance: 100 Locomotion: Wheelchair: 1: Total Assistance/staff pushes wheelchair (Pt<25%) FIM - Locomotion: Ambulation Locomotion: Ambulation Assistive Devices: Other (comment), Orthosis (L air cast, ACE wrap for dorsiflexion assist,  L shoe cover, L GivMohr sling) Ambulation/Gait Assistance: 1: +2 Total assist (3 musketeers and close w/c follow) Locomotion: Ambulation: 0: Activity did not occur  Comprehension Comprehension Mode: Auditory Comprehension: 5-Follows basic conversation/direction: With extra time/assistive device  Expression Expression Mode: Verbal Expression: 5-Expresses complex 90% of the time/cues < 10% of the time  Social Interaction Social Interaction: 4-Interacts appropriately 75 - 89% of the time - Needs redirection for appropriate language or to initiate interaction.  Problem Solving Problem Solving:  3-Solves basic 50 - 74% of the time/requires cueing 25 - 49% of the time  Memory Memory: 3-Recognizes or recalls 50 - 74% of the time/requires cueing 25 - 49% of the time  Medical Problem List and Plan: 1. Functional deficits secondary to Right MCA infarct with left hemiparesis, dysarthria, dysphagia, and visual spatial deficits- increasing tone in LUE add resting hand splint 2. DVT Prophylaxis/Anticoagulation: Pharmaceutical: Lovenox 3. New onset Headaches/Pain Management: Continue tylenol prn for now.Left ankle OA, tramadol 100mg  prior to therapy, avoid NSAIDs secondary to recent CVA, overall improved 5. Neuropsych: This patient is not capable of making decisions on his own behalf. 6. Skin/Wound Care: Routine pressure relief measures. turn q2 h 7. Fluids/Electrolytes/Nutrition: Monitor I/O.Hold IVF, recheck BMET on 12/18 was nl 8. HTN: Will monitor every 8 hours. Currently controlled of medications (and to avoid hypotension). will resume medications as indicated. flomax may lower BP   10 OSA: Continue CPAP (has home machine) whenever asleep. poor compliance, discussed increase CVA risk, ? desat at noc 11. Recent esophagitis/duodenitis due to gastric ulcer: Protonix bid to help promote healing 12. CAD: Has chest wall pain due to poorly Healed sternum. Continue ASA, plavix, lipitor--off imdur and coreg  13.  Left ankle pain with ROM and wt bearing, non progressive, exam with minimal swelling, no erythema, not consistent, xray with mod OA, tramadol is helping but makes him drowsy, change to ultracet, avoid NSAID secondary to recent CVA 14.   UTI , enterococcus, sens to amp will cont  Amoxil, likely related to urinary retention LOS (Days) 24 A FACE TO FACE EVALUATION WAS PERFORMED  Jakeira Seeman E 03/11/2014, 9:01 AM

## 2014-03-11 NOTE — Progress Notes (Signed)
Patient took 3 spoon fulls of ensure pudding and refused the rest, encouraged patient to eat more patient declined. Offered patient vanilla ensure as replacement.

## 2014-03-12 ENCOUNTER — Inpatient Hospital Stay (HOSPITAL_COMMUNITY): Payer: 59 | Admitting: Occupational Therapy

## 2014-03-12 LAB — BASIC METABOLIC PANEL
Anion gap: 12 (ref 5–15)
BUN: 8 mg/dL (ref 6–23)
CO2: 20 mmol/L (ref 19–32)
Calcium: 8.9 mg/dL (ref 8.4–10.5)
Chloride: 106 mEq/L (ref 96–112)
Creatinine, Ser: 1.04 mg/dL (ref 0.50–1.35)
GFR calc Af Amer: 86 mL/min — ABNORMAL LOW (ref >90)
GFR calc non Af Amer: 74 mL/min — ABNORMAL LOW (ref >90)
Glucose, Bld: 109 mg/dL — ABNORMAL HIGH (ref 70–99)
Potassium: 4 mmol/L (ref 3.5–5.1)
Sodium: 138 mmol/L (ref 135–145)

## 2014-03-12 NOTE — Progress Notes (Signed)
Occupational Therapy Weekly Progress Note  Patient Details  Name: Gregory Wiley MRN: [id number] Date of Birth: 1951-02-04  Beginning of progress report period: March 05, 2014 End of progress report period: March 12, 2014  Patient has met 2 of 4 short term goals.  Pt is making steady progress this week now that his left ankle pain has decreased significantly.  He is able to tolerate further weightbearing over the LLE for LB selfcare tasks to be performed sit to stand instead of supine to sit.  Mod assist needed for sit to stand at times with +2 assistance still needed when attempting peri care or pulling pants over hips.  He is able to perform squat pivot transfers to the wheelchair as well with max assist instead of relying on the use of the sliding board or lift.  Increased tone is beginning to form in the left elbow, pectoral and digits.  Resting hand splint has been provided for night time wear to help with positioning and family is familiar with positioning of the arm in the bed.  Functionally no active movement is noted in the digits but pt does exhibit some trace shoulder flexion.  One finger inferior anterior subluxation is also present in the shoulder which has resulted in increased pain with abduction and some shoulder flexion at times.  Mohr sling has been used with PT when ambulating to help decrease pain.  Total assist needed to integrate the LUE into bathing tasks at this time.  Left visual attention has improved dramatically and he is able to easily locate grooming items placed left of midline. However, he continues to need mod instructional cueing to remember to wash his left arm and leg.  Decreased carryover of hemi dressing techniques with regards to threading the left arm and beginning dressing the LLE.  Feel pt is making good progress at this time with ADLs.  Will continue to current OT treatment place until SNF bed has been found.    Patient continues to demonstrate the following  deficits: decreased balance, decreased strength, decreased functional use of the LUE,  Left neglect,and therefore will continue to benefit from skilled OT intervention to enhance overall performance with BADL.  Patient progressing toward long term goals..  Continue plan of care.  OT Short Term Goals Week 4:  OT Short Term Goal 1 (Week 4): Pt will perform LB bathing with max assist sit to stand for 2 consecutive sessions. OT Short Term Goal 2 (Week 4): Pt will donn pullover shirt with supervision and no more than one instructional cue.   OT Short Term Goal 3 (Week 4): Pt will perform toilet transfer stand pivot with max assist. OT Short Term Goal 4 (Week 4): Pt will use the LUE as a stabilizer with selfcare tasks with max assist.  OT Short Term Goal 5 (Week 4): Pt will perform walk-in shower transfer with max assist to tub bench or seat.  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

## 2014-03-12 NOTE — Progress Notes (Signed)
Patient had c/o of pressure on the bladder with urge to void, bladder scan 30 cc, in and out cath removed 150 cc of urine, patient denies pressure sensation post in and out cath.  Will continue to monitor Kimba Lottes, RN

## 2014-03-12 NOTE — Progress Notes (Signed)
Patient ID: Gregory Wiley, male   DOB: 07-15-1950, 63 y.o.   MRN: [id number]   03/12/14.   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  States that he feels "stoned" and requesting a reduction in analgesics  Past Medical History  Diagnosis Date  . Coronary artery disease     stent, then CABG 07/20/10  . MI (myocardial infarction)     NSTEMI- last cath 06/2011-Stent to LCX-DES, last nuc 07/30/11 low risk  . Diabetes mellitus     hx of, diet controlled  . Hypertension   . Hyperlipidemia   . Obesity (BMI 30-39.9)   . OSA on CPAP     since July 2013  . H/O cardiomyopathy     ischemic, now last echo 07/30/11, EF 38%W  . Diverticulosis   . Internal hemorrhoids   . Tubular adenoma of colon   . Plantar fasciitis of left foot   . Stroke     History   Social History  . Marital Status: Married    Spouse Name: N/A    Number of Children: 64  . Years of Education: N/A   Occupational History  .      works at the [employer] Topics  . Smoking status: Former Research scientist (life sciences)  . Smokeless tobacco: Never Used  . Alcohol Use: Yes     Comment: 1-2 times per year  . Drug Use: No  . Sexual Activity: Not on file   Other Topics Concern  . Not on file   Social History Narrative    Past Surgical History  Procedure Laterality Date  . Coronary artery bypass graft  07/20/2010     LIMA-LAD; VG-ACUTE MARG of RCA; Seq VG-distal RCA & then pda  . Coronary angioplasty with stent placement  07/01/2011    DES-Resolute to native LCX  . Coronary angioplasty with stent placement  12/01/2007    COMPLEX 5 LESION PCI INCLUDING CUTTING BALLOON AND 4 CYPHER DESs  . Hernia repair  2006  . Knee surgery Right   . Hand surgery      to take glass out  . Colonoscopy N/A 02/10/2013    Procedure: COLONOSCOPY;  Surgeon: Ladene Artist, MD;  Location: [facility]  ENDOSCOPY;  Service: Endoscopy;  Laterality: N/A;  . Retinal detachment surgery    . Loop recorder implant  02/15/2014    MDT LINQ implanted by Dr Rayann Heman for cryptogenic stroke  . Tee without cardioversion N/A 02/15/2014    Procedure: TRANSESOPHAGEAL ECHOCARDIOGRAM (TEE);  Surgeon: Josue Hector, MD;  Location: [facility] ENDOSCOPY;  Service: Cardiovascular;  Laterality: N/A;  . Left heart catheterization with coronary/graft angiogram  07/01/2011    Procedure: LEFT HEART CATHETERIZATION WITH Beatrix Fetters;  Surgeon: Sanda Klein, MD;  Location: [place] CATH LAB;  Service: Cardiovascular;;  . Percutaneous coronary stent intervention (pci-s) Right 07/01/2011    Procedure: PERCUTANEOUS CORONARY STENT INTERVENTION (PCI-S);  Surgeon: Sanda Klein, MD;  Location: [facility] CATH LAB;  Service: Cardiovascular;  Laterality: Right;    Family History  Problem Relation Age of Onset  . Diabetes type II Mother   . Coronary artery disease    . Colon cancer Neg Hx   . CAD Father 56    CABG, PCI, died at 28  . Heart attack Father 33  . Hypertension Brother   . Diabetes Brother   . Hyperlipidemia Brother   . Brain  cancer Maternal Grandmother   . COPD Paternal Grandfather     Allergies  Allergen Reactions  . Diphenhydramine Hcl Anaphylaxis    No current facility-administered medications on file prior to encounter.   Current Outpatient Prescriptions on File Prior to Encounter  Medication Sig Dispense Refill  . aspirin EC 81 MG EC tablet Take 1 tablet (81 mg total) by mouth daily.    Marland Kitchen atorvastatin (LIPITOR) 80 MG tablet Take 1 tablet (80 mg total) by mouth daily at 6 PM.    . carvedilol (COREG) 6.25 MG tablet Take 1 tablet (6.25 mg total) by mouth 2 (two) times daily with a meal.    . clopidogrel (PLAVIX) 75 MG tablet Take 1 tablet (75 mg total) by mouth daily.    . furosemide (LASIX) 40 MG tablet Take 20 mg by mouth daily as needed for fluid.     . isosorbide mononitrate (IMDUR) 30 MG 24 hr tablet Take  0.5 tablets (15 mg total) by mouth daily. 45 tablet 1  . nitroGLYCERIN (NITROSTAT) 0.4 MG SL tablet Place 0.4 mg under the tongue every 5 (five) minutes as needed. For chest pain    . omeprazole (PRILOSEC) 20 MG capsule Take 20 mg by mouth 2 (two) times daily.    . pantoprazole (PROTONIX) 40 MG tablet Take 1 tablet (40 mg total) by mouth daily.    . ranolazine (RANEXA) 500 MG 12 hr tablet Take 1 tablet (500 mg total) by mouth 2 (two) times daily. 180 tablet 1  . simvastatin (ZOCOR) 40 MG tablet Take 40 mg by mouth every evening.    . Vitamin D, Ergocalciferol, (DRISDOL) 50000 UNITS CAPS capsule Take 50,000 Units by mouth every 7 (seven) days. Take on Mondays    . carvedilol (COREG) 6.25 MG tablet Take 1 tablet (6.25 mg total) by mouth 2 (two) times daily. 180 tablet 3  . clopidogrel (PLAVIX) 75 MG tablet Take 75 mg by mouth daily.      BP 97/77 mmHg  Pulse 61  Temp(Src) 97.8 F (36.6 C) (Oral)  Resp 18  Ht $R'6\' 1"'xD$  (1.854 m)  Wt 144.108 kg (317 lb 11.2 oz)  BMI 41.92 kg/m2  SpO2 94%  Patient Vitals for the past 24 hrs:  BP Temp Temp src Pulse Resp SpO2  03/12/14 0550 97/77 mmHg 97.8 F (36.6 C) Oral 61 18 94 %  03/11/14 1544 107/67 mmHg 98 F (36.7 C) Oral 65 18 97 %     Intake/Output Summary (Last 24 hours) at 03/12/14 0846 Last data filed at 03/12/14 0000  Gross per 24 hour  Intake    960 ml  Output   1050 ml  Net    -90 ml      Review of Systems - Negative except above    Poor awareness of deficits secondary to R MCA lg infarct Objective: Vital Signs: Blood pressure 97/77, pulse 61, temperature 97.8 F (36.6 C), temperature source Oral, resp. rate 18, height $RemoveBe'6\' 1"'QOPNEFHZJ$  (1.854 m), weight 144.108 kg (317 lb 11.2 oz), SpO2 94 %. No results found. Results for orders placed or performed during the hospital encounter of 02/15/14 (from the past 72 hour(s))  CBC     Status: Abnormal   Collection Time: 03/11/14  6:12 AM  Result Value Ref Range   WBC 5.9 4.0 - 10.5 K/uL   RBC 4.38  4.22 - 5.81 MIL/uL   Hemoglobin 11.9 (L) 13.0 - 17.0 g/dL   HCT 36.9 (L) 39.0 - 52.0 %  MCV 84.2 78.0 - 100.0 fL   MCH 27.2 26.0 - 34.0 pg   MCHC 32.2 30.0 - 36.0 g/dL   RDW 13.7 11.5 - 15.5 %   Platelets 219 150 - 400 K/uL  Basic metabolic panel     Status: Abnormal   Collection Time: 03/12/14  3:57 AM  Result Value Ref Range   Sodium 138 135 - 145 mmol/L    Comment: Please note change in reference range.   Potassium 4.0 3.5 - 5.1 mmol/L    Comment: Please note change in reference range.   Chloride 106 96 - 112 mEq/L   CO2 20 19 - 32 mmol/L   Glucose, Bld 109 (H) 70 - 99 mg/dL   BUN 8 6 - 23 mg/dL   Creatinine, Ser 1.04 0.50 - 1.35 mg/dL   Calcium 8.9 8.4 - 10.5 mg/dL   GFR calc non Af Amer 74 (L) >90 mL/min   GFR calc Af Amer 86 (L) >90 mL/min    Comment: (NOTE) The eGFR has been calculated using the CKD EPI equation. This calculation has not been validated in all clinical situations. eGFR's persistently <90 mL/min signify possible Chronic Kidney Disease.    Anion gap 12 5 - 15     HEENT: normal Cardio: RRR and no murmur  .  Sternotomy scar Resp: CTA B/L and unlabored GI: BS positive and NT, ND Extremity:  Pulses positive and No Edema Skin:   Intact Neuro: Alert/Oriented, Cranial Nerve Abnormalities Left central 7, Normal Sensory, Abnormal Motor 0/5 LUE and LLE persistent. Does sense pain. , Dysarthric and left Inattention, right gaze preference better MAS 2-3 Left elbow flexors , MAS 0 in FF    Musc/Skel:  Other no pain with Left shoulder ROM MSK- mild pain in Left shoulder; left wrist and bilateral ankle braces Gen NAD   Assessment/Plan: 1. 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 DVT Prophylaxis/Anticoagulation: Pharmaceutical: Lovenox 3. New onset Headaches/Pain Management: Continue tylenol prn for now.Left ankle OA, tramadol $RemoveBefo'100mg'jPQnPLWBLKh$  prior to therapy, avoid NSAIDs secondary to recent  CVA, overall improved 5. Neuropsych: This patient is not capable of making decisions on his own behalf. 6. Skin/Wound Care: Routine pressure relief measures. turn q2 h 7. Fluids/Electrolytes/Nutrition: Monitor I/O.Hold IVF, recheck BMET on 12/18 was nl 8. HTN: Will monitor every 8 hours. Currently controlled of medications (and to avoid hypotension). will resume medications as indicated. flomax may lower BP   10 OSA: Continue CPAP (has home machine) whenever asleep. poor compliance, discussed increase CVA risk, ? desat at noc 11. Recent esophagitis/duodenitis due to gastric ulcer: Protonix bid to help promote healing 12. CAD: Has chest wall pain due to poorly Healed sternum. Continue ASA, plavix, lipitor--off imdur and coreg  13.  Left ankle pain with ROM and wt bearing, non progressive, exam with minimal swelling, no erythema, not consistent, xray with mod OA, tramadol is helping but makes him drowsy, change to ultracet, avoid NSAID secondary to recent CVA 14.   UTI , enterococcus, sens to amp will discontinue ampicillin today after 7 days treatment   LOS (Days) 25 A FACE TO FACE EVALUATION WAS PERFORMED  Nyoka Cowden 03/12/2014, 8:44 AM

## 2014-03-13 ENCOUNTER — Encounter (HOSPITAL_COMMUNITY): Payer: 59 | Admitting: *Deleted

## 2014-03-13 ENCOUNTER — Inpatient Hospital Stay (HOSPITAL_COMMUNITY): Payer: 59

## 2014-03-13 ENCOUNTER — Inpatient Hospital Stay (HOSPITAL_COMMUNITY): Payer: 59 | Admitting: Rehabilitation

## 2014-03-13 NOTE — Progress Notes (Signed)
Physical Therapy Session Note  Patient Details  Name: Gregory Wiley MRN: [id number] Date of Birth: Oct 05, 1950  Today's Date: 03/13/2014 PT Individual Time: 1130-1200 PT Individual Time Calculation (min): 30 min   Short Term Goals: Week 4:  PT Short Term Goal 1 (Week 4): Pt will perform sit <> supine with max A x 1 with use of hospital bed functions with 25% cues for sequencing.  PT Short Term Goal 2 (Week 4): Patient will initiate transfer bed <> w/c with total A and 25% cues for sequencing.  PT Short Term Goal 3 (Week 4): Patient will maintain static standing x 5 mins at mod A PT Short Term Goal 4 (Week 4): Pt will ambulate x 45' consistently with +2 assist (+3 for chair follow)  Skilled Therapeutic Interventions/Progress Updates:   Pt received sitting in w/c in room, wife present.  Reluctantly agreeable to therapy due to increased pain in L ankle today.  Skilled session focused on stretching of L achilles tendon/gastroc x 3 reps of 30 secs.  Pt with decreased tolerance, therefore PT needing to decrease amount of stretch throughout.  Then attempted standing x 2 reps with lateral weight shifts to slowly increase WB through LLE.  Pt would continue to scream in pain and "need to sit" despite max verbal cues for therapist and wife to continue to stand.   Pt then states he feels like he needs to use the restroom, therefore assisted back to room and utilized bari stedy to transfer to [place] bedside commode in room.  Requires max A to stand (+2 for safety and clothing management). Pt left on stedy and nurse and nurse tech notified.  Also discussed pts L ankle pain with RN.    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  Feel he still needs some sort of pain management during therapy.    Locomotion : Ambulation Ambulation/Gait Assistance: 1: +2 Total assist   See FIM for current functional status  Therapy/Group: Individual Therapy  Denice Bors 03/13/2014, 12:41 PM

## 2014-03-13 NOTE — Progress Notes (Signed)
Occupational Therapy Session Note  Patient Details  Name: Gregory Wiley MRN: [id number] Date of Birth: 19-Oct-1950  Today's Date: 03/13/2014 OT Individual Time: 3582-5189 OT Individual Time Calculation (min): 30 min    Skilled Therapeutic Interventions/Progress Updates: Patient seen for bed mobility with +2 assist and UB dressing with moderate assist and cues.  Patient lower body already cleaned and dressed prior to session.   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   Creme and brace applied.     See FIM for current functional status  Therapy/Group: Individual Therapy  Alfredia Ferguson [facility] 03/13/2014, 3:38 PM

## 2014-03-13 NOTE — Progress Notes (Signed)
Patient ID: Gregory Wiley, male   DOB: Aug 23, 1950, 63 y.o.   MRN: [id number]  Patient ID: Gregory Wiley, male   DOB: 02-06-1951, 63 y.o.   MRN: [id number]   03/13/14.   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  Continues to complain of drowsiness and dry mouth secondary to tramadol.  Remains afebrile (ampicillin discontinued yesterday)  Past Medical History  Diagnosis Date  . Coronary artery disease     stent, then CABG 07/20/10  . MI (myocardial infarction)     NSTEMI- last cath 06/2011-Stent to LCX-DES, last nuc 07/30/11 low risk  . Diabetes mellitus     hx of, diet controlled  . Hypertension   . Hyperlipidemia   . Obesity (BMI 30-39.9)   . OSA on CPAP     since July 2013  . H/O cardiomyopathy     ischemic, now last echo 07/30/11, EF 38%W  . Diverticulosis   . Internal hemorrhoids   . Tubular adenoma of colon   . Plantar fasciitis of left foot   . Stroke     History   Social History  . Marital Status: Married    Spouse Name: N/A    Number of Children: 66  . Years of Education: N/A   Occupational History  .      works at the [employer] Topics  . Smoking status: Former Research scientist (life sciences)  . Smokeless tobacco: Never Used  . Alcohol Use: Yes     Comment: 1-2 times per year  . Drug Use: No  . Sexual Activity: Not on file   Other Topics Concern  . Not on file   Social History Narrative    Past Surgical History  Procedure Laterality Date  . Coronary artery bypass graft  07/20/2010     LIMA-LAD; VG-ACUTE MARG of RCA; Seq VG-distal RCA & then pda  . Coronary angioplasty with stent placement  07/01/2011    DES-Resolute to native LCX  . Coronary angioplasty with stent placement  12/01/2007    COMPLEX 5 LESION PCI INCLUDING CUTTING BALLOON AND 4 CYPHER DESs  . Hernia repair  2006  . Knee surgery Right   . Hand surgery       to take glass out  . Colonoscopy N/A 02/10/2013    Procedure: COLONOSCOPY;  Surgeon: Ladene Artist, MD;  Location: [facility] ENDOSCOPY;  Service: Endoscopy;  Laterality: N/A;  . Retinal detachment surgery    . Loop recorder implant  02/15/2014    MDT LINQ implanted by Dr Rayann Heman for cryptogenic stroke  . Tee without cardioversion N/A 02/15/2014    Procedure: TRANSESOPHAGEAL ECHOCARDIOGRAM (TEE);  Surgeon: Josue Hector, MD;  Location: [facility] ENDOSCOPY;  Service: Cardiovascular;  Laterality: N/A;  . Left heart catheterization with coronary/graft angiogram  07/01/2011    Procedure: LEFT HEART CATHETERIZATION WITH Beatrix Fetters;  Surgeon: Sanda Klein, MD;  Location: [place] CATH LAB;  Service: Cardiovascular;;  . Percutaneous coronary stent intervention (pci-s) Right 07/01/2011    Procedure: PERCUTANEOUS CORONARY STENT INTERVENTION (PCI-S);  Surgeon: Sanda Klein, MD;  Location[facility] CATH LAB;  Service: Cardiovascular;  Laterality: Right;    Family History  Problem Relation Age of Onset  . Diabetes type II Mother   . Coronary artery disease    . Colon cancer Neg Hx   . CAD Father 11    CABG, PCI, died at 73  .  Heart attack Father 68  . Hypertension Brother   . Diabetes Brother   . Hyperlipidemia Brother   . Brain cancer Maternal Grandmother   . COPD Paternal Grandfather     Allergies  Allergen Reactions  . Diphenhydramine Hcl Anaphylaxis    No current facility-administered medications on file prior to encounter.   Current Outpatient Prescriptions on File Prior to Encounter  Medication Sig Dispense Refill  . aspirin EC 81 MG EC tablet Take 1 tablet (81 mg total) by mouth daily.    Marland Kitchen atorvastatin (LIPITOR) 80 MG tablet Take 1 tablet (80 mg total) by mouth daily at 6 PM.    . carvedilol (COREG) 6.25 MG tablet Take 1 tablet (6.25 mg total) by mouth 2 (two) times daily with a meal.    . clopidogrel (PLAVIX) 75 MG tablet Take 1 tablet (75 mg total) by mouth daily.    . furosemide  (LASIX) 40 MG tablet Take 20 mg by mouth daily as needed for fluid.     . isosorbide mononitrate (IMDUR) 30 MG 24 hr tablet Take 0.5 tablets (15 mg total) by mouth daily. 45 tablet 1  . nitroGLYCERIN (NITROSTAT) 0.4 MG SL tablet Place 0.4 mg under the tongue every 5 (five) minutes as needed. For chest pain    . omeprazole (PRILOSEC) 20 MG capsule Take 20 mg by mouth 2 (two) times daily.    . pantoprazole (PROTONIX) 40 MG tablet Take 1 tablet (40 mg total) by mouth daily.    . ranolazine (RANEXA) 500 MG 12 hr tablet Take 1 tablet (500 mg total) by mouth 2 (two) times daily. 180 tablet 1  . simvastatin (ZOCOR) 40 MG tablet Take 40 mg by mouth every evening.    . Vitamin D, Ergocalciferol, (DRISDOL) 50000 UNITS CAPS capsule Take 50,000 Units by mouth every 7 (seven) days. Take on Mondays    . carvedilol (COREG) 6.25 MG tablet Take 1 tablet (6.25 mg total) by mouth 2 (two) times daily. 180 tablet 3  . clopidogrel (PLAVIX) 75 MG tablet Take 75 mg by mouth daily.      BP 102/55 mmHg  Pulse 69  Temp(Src) 97.9 F (36.6 C) (Oral)  Resp 19  Ht _0  (1.854 m)  Wt 144.108 kg (317 lb 11.2 oz)  BMI 41.92 kg/m2  SpO2 93%  Patient Vitals for the past 24 hrs:  BP Temp Temp src Pulse Resp SpO2  03/13/14 0359 (!) 102/55 mmHg 97.9 F (36.6 C) Oral 69 19 93 %  03/12/14 1355 104/66 mmHg 97.4 F (36.3 C) Axillary 69 18 96 %     Intake/Output Summary (Last 24 hours) at 03/13/14 0820 Last data filed at 03/13/14 0654  Gross per 24 hour  Intake    360 ml  Output   1050 ml  Net   -690 ml      Review of Systems - Negative except above    Poor awareness of deficits secondary to R MCA lg infarct Objective: Vital Signs: Blood pressure 102/55, pulse 69, temperature 97.9 F (36.6 C), temperature source Oral, resp. rate 19, height _1  (1.854 m), weight 144.108 kg (317 lb 11.2 oz), SpO2 93 %. No results found. Results for orders placed or performed during the hospital encounter of 02/15/14 (from the  past 72 hour(s))  CBC     Status: Abnormal   Collection Time: 03/11/14  6:12 AM  Result Value Ref Range   WBC 5.9 4.0 - 10.5 K/uL   RBC 4.38 4.22 -  5.81 MIL/uL   Hemoglobin 11.9 (L) 13.0 - 17.0 g/dL   HCT 36.9 (L) 39.0 - 52.0 %   MCV 84.2 78.0 - 100.0 fL   MCH 27.2 26.0 - 34.0 pg   MCHC 32.2 30.0 - 36.0 g/dL   RDW 13.7 11.5 - 15.5 %   Platelets 219 150 - 400 K/uL  Basic metabolic panel     Status: Abnormal   Collection Time: 03/12/14  3:57 AM  Result Value Ref Range   Sodium 138 135 - 145 mmol/L    Comment: Please note change in reference range.   Potassium 4.0 3.5 - 5.1 mmol/L    Comment: Please note change in reference range.   Chloride 106 96 - 112 mEq/L   CO2 20 19 - 32 mmol/L   Glucose, Bld 109 (H) 70 - 99 mg/dL   BUN 8 6 - 23 mg/dL   Creatinine, Ser 1.04 0.50 - 1.35 mg/dL   Calcium 8.9 8.4 - 10.5 mg/dL   GFR calc non Af Amer 74 (L) >90 mL/min   GFR calc Af Amer 86 (L) >90 mL/min    Comment: (NOTE) The eGFR has been calculated using the CKD EPI equation. This calculation has not been validated in all clinical situations. eGFR's persistently <90 mL/min signify possible Chronic Kidney Disease.    Anion gap 12 5 - 15     HEENT: normal Cardio: RRR and no murmur  .  Sternotomy scar Resp: CTA B/L and unlabored GI: BS positive and NT, ND Extremity:  Pulses positive and No Edema Skin:   Intact Neuro: Alert/Oriented, Cranial Nerve Abnormalities Left central 7, Normal Sensory, Abnormal Motor 0/5 LUE and LLE persistent. Does sense pain. , Dysarthric and left Inattention, right gaze preference better MAS 2-3 Left elbow flexors , MAS 0 in FF    Musc/Skel:  Other no pain with Left shoulder ROM MSK- mild pain in Left shoulder; left wrist and bilateral ankle braces Gen NAD   Assessment/Plan: 1. 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 DVT Prophylaxis/Anticoagulation: Pharmaceutical: Lovenox 3.  New onset Headaches/Pain Management: Continue tylenol prn for now.Left ankle OA, tramadol 162m prior to therapy, avoid NSAIDs secondary to recent CVA, overall improved 5. Neuropsych: This patient is not capable of making decisions on his own behalf. 6. Skin/Wound Care: Routine pressure relief measures. turn q2 h 7. Fluids/Electrolytes/Nutrition: Monitor I/O.Hold IVF, recheck BMET on 12/18 was nl 8. HTN: Will monitor every 8 hours. Currently controlled of medications (and to avoid hypotension). will resume medications as indicated. flomax may lower BP   10 OSA: Continue CPAP (has home machine) whenever asleep. poor compliance, discussed increase CVA risk, ? desat at noc 11. Recent esophagitis/duodenitis due to gastric ulcer: Protonix bid to help promote healing 12. CAD: Has chest wall pain due to poorly Healed sternum. Continue ASA, plavix, lipitor--off imdur and coreg  13.  Left ankle pain with ROM and wt bearing, non progressive, exam with minimal swelling, no erythema, not consistent, xray with mod OA, tramadol is helping but makes him drowsy; will discontinue  14.   UTI , enterococcus, sens to amp- discontinued after 7 days of therapy (03/11/14)  LOS (Days) 26 A FACE TO FACE EVALUATION WAS PERFORMED  KNyoka Cowden12/27/2015, 8:20 AM

## 2014-03-13 NOTE — Progress Notes (Signed)
Physical Therapy Session Note  Patient Details  Name: Gregory Wiley MRN: [id number] Date of Birth: 12/31/1950  Today's Date: 03/13/2014 PT Individual Time: 1000-1100 PT Individual Time Calculation (min): 60 min   Session 2 Time: 2992-4268 Time Calculation (min): 30  Short Term Goals: Week 4:  PT Short Term Goal 1 (Week 4): Pt will perform sit <> supine with max A x 1 with use of hospital bed functions with 25% cues for sequencing.  PT Short Term Goal 2 (Week 4): Patient will initiate transfer bed <> w/c with total A and 25% cues for sequencing.  PT Short Term Goal 3 (Week 4): Patient will maintain static standing x 5 mins at mod A PT Short Term Goal 4 (Week 4): Pt will ambulate x 45' consistently with +2 assist (+3 for chair follow)  Skilled Therapeutic Interventions/Progress Updates:    Session 1: Pt received asleep in bed, easily aroused and agreeable to participate in therapy. Pt moved supine>sit w/ +2A (pt 50%). Session focused on functional transfers, standing tolerance, gait. Pt complaining of increased pain in L ankle, L shoulder, and increased lethargy from pain medication. Attempted several times to transfer bed<>w/c w/ squat pivot then stand pivot technique. Pt able to come to standing but unable to take step in order to pivot. With slide board and max cueing for sequencing, hand placement, and foot placement and +3 A (pt performing 60%). Pt able to come sit<>stand several times during session with overall +2 A (pt performing 70%). Pt able to take 2 steps and then 4 steps for gait training w/ +3 A w/ 3 musketeers technique, close w/c follow, therapist providing TotalA to advance LLE and to maintain L knee in stance phase, MaxA to weight shift L and R to advance limbs. Note that this is profound decrease in ability vs Thursday 12/24, when pt was able to ambulate >100'. Communicated to primary PT re: pt's performance in session. Pt provided with passive stretching to manage tone in LUE with  marked reduction in tone post-stretching. Pt positioned in wheelchair for L shoulder support. Session ended in pt's room, where pt was left seated in TIS chair w/ wife present and all needs within reach.   Session 2: Pt received supine in bed, agreeable to participate in therapy w/ min encouragement. Session focused on standing tolerance, standing balance, weight shifting. Pt transferred bed>w/c w/ STEDI lift and +2 A (pt performing 50% to move sit<>stand). Noted pt complained of increased pain in ankle when standing in STEDI, even while sitting in high perch. In rehab gym pt came to standing x3 w/ use of arm rest then parallel bar for RUE support. Pt required +2 A to move sit<>stand and remain standing (pt performing 50-60%). Pt tolerated standing for 30", then 1' then 2' while engaged in functional reach activities w/ RUE. Pt would intermittently lose balance to L if reaching far across midline w/ RUE, able to recover balance w/ +2 A and cueing for upright posture. Pt left seated in TIS w/c in care of family.    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

## 2014-03-14 ENCOUNTER — Inpatient Hospital Stay (HOSPITAL_COMMUNITY): Payer: 59 | Admitting: Physical Therapy

## 2014-03-14 ENCOUNTER — Inpatient Hospital Stay (HOSPITAL_COMMUNITY): Payer: 59 | Admitting: Speech Pathology

## 2014-03-14 ENCOUNTER — Inpatient Hospital Stay (HOSPITAL_COMMUNITY): Payer: 59

## 2014-03-14 ENCOUNTER — Encounter (HOSPITAL_COMMUNITY): Payer: 59

## 2014-03-14 NOTE — Progress Notes (Signed)
Speech Language Pathology Daily Session Note  Patient Details  Name: Gregory Wiley MRN: [id number] Date of Birth: [birth year]  Today's Date: 03/14/2014 SLP Individual Time: 1402-1502 SLP Individual Time Calculation (min): 60 min  Short Term Goals: Week 4: SLP Short Term Goal 1 (Week 4): Pt will demonstrate effective mastication of regular consistencies with no overt s/s of aspiration and Min cues for use of swallowing strategies.  SLP Short Term Goal 2 (Week 4): Patient will attend to left of midline and left upper extremity during basic functional tasks with Min mulitmodal cues  SLP Short Term Goal 3 (Week 4): Patient will label 2 physical and 2 cognitive deficits with Min question cues SLP Short Term Goal 4 (Week 4): Patient will sustain attention to basic tasks for 5-8  minutes with Mod verbal cues for redirections  Skilled Therapeutic Interventions:  Pt was seen for skilled ST targeting cognitive goals.  Upon arrival, pt was was seated upright in wheelchair, awake, alert, and agreeable to participate in [place].  SLP reoriented pt to current goals and progress in therapy to facilitate carryover of skills in structured therapeutic tasks.  Pt reports feeling "foggy headed" and "loopy" and, per report from team, declines pain medications which limits him in therapy.  Therefore, SLP provided skilled stroke education, specifically with deficits occurring s/p R CVA including cognitive deficits, decreased sustained attention, and decreased awareness of errors.  Pt was receptive to education and became tearful with this therapist, verbalizing frustration and difficulty coping with his loss of independence.  However, he was quickly redirected to a structured therapeutic task.  SLP facilitated the session with a card game targeting visual scanning to the left and basic problem solving.  Pt was able to complete the abovementioned task for ~80% accuracy with overall supervision cues to attend to the left.  SLP  increased challenge with a word search targeting sustained attention and visual scanning to the left, during which pt benefited from min-mod verbal cues and intermittent use of marginal anchors and limiting visual stimuli to focus visual attention.  Continue per current plan of care.   FIM:  Comprehension Comprehension Mode: Auditory Comprehension: 4-Understands basic 75 - 89% of the time/requires cueing 10 - 24% of the time Expression Expression Mode: Verbal Expression: 5-Expresses basic 90% of the time/requires cueing < 10% of the time. Social Interaction Social Interaction: 4-Interacts appropriately 75 - 89% of the time - Needs redirection for appropriate language or to initiate interaction. Problem Solving Problem Solving: 4-Solves basic 75 - 89% of the time/requires cueing 10 - 24% of the time Memory Memory: 3-Recognizes or recalls 50 - 74% of the time/requires cueing 25 - 49% of the time  Pain Pain Assessment Pain Assessment: 0-10 Pain Score: 3  Pain Location: Ankle Pain Orientation: Left Pain Descriptors / Indicators: Aching Pain Intervention(s): Repositioned  Therapy/Group: Individual Therapy  Leif Loflin, Selinda Orion 03/14/2014, 4:22 PM

## 2014-03-14 NOTE — Progress Notes (Signed)
Occupational Therapy Session Note  Patient Details  Name: Gregory Wiley MRN: [id number] Date of Birth: 1950/05/20  Today's Date: 03/14/2014 OT Individual Time: 1100-1200 OT Individual Time Calculation (min): 60 min    Short Term Goals: Week 4:  OT Short Term Goal 1 (Week 4): Pt will perform LB bathing with max assist sit to stand for 2 consecutive sessions. OT Short Term Goal 2 (Week 4): Pt will donn pullover shirt with supervision and no more than one instructional cue.   OT Short Term Goal 3 (Week 4): Pt will perform toilet transfer stand pivot with max assist. OT Short Term Goal 4 (Week 4): Pt will use the LUE as a stabilizer with selfcare tasks with max assist.  OT Short Term Goal 5 (Week 4): Pt will perform walk-in shower transfer with max assist to tub bench or seat.  Skilled Therapeutic Interventions/Progress Updates:    Pt engaged in BADL retraining including bathing and dressing with sit<>stand from w/c at sink.  Pt exhibiting trace left shoulder adduction but patient states he can't feel the movement.  Pt performed sit<>stand at sink X 3 during session requiring max a for sit<>stand and standing balance.  Pt required tot A (HOH) to use LUE to bathe RUE.  Pt c/o increased pain in left ankle when standing.  Focus on activity tolerance, sit<>stand, standing balance, attention to left, attention to task, and safety awareness.  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

## 2014-03-14 NOTE — Progress Notes (Signed)
Subjective/Complaints: Left hip sore. Better after being turned  Review of Systems - Negative except above    Poor awareness of deficits secondary to R MCA lg infarct Objective: Vital Signs: Blood pressure 111/50, pulse 75, temperature 97.8 F (36.6 C), temperature source Oral, resp. rate 19, height $RemoveBe'6\' 1"'[id number]$  (1.854 m), weight 144.108 kg (317 lb 11.2 oz), SpO2 94 %. No results found. Results for orders placed or performed during the hospital encounter of 02/15/14 (from the past 72 hour(s))  Basic metabolic panel     Status: Abnormal   Collection Time: 03/12/14  3:57 AM  Result Value Ref Range   Sodium 138 135 - 145 mmol/L    Comment: Please note change in reference range.   Potassium 4.0 3.5 - 5.1 mmol/L    Comment: Please note change in reference range.   Chloride 106 96 - 112 mEq/L   CO2 20 19 - 32 mmol/L   Glucose, Bld 109 (H) 70 - 99 mg/dL   BUN 8 6 - 23 mg/dL   Creatinine, Ser 1.04 0.50 - 1.35 mg/dL   Calcium 8.9 8.4 - 10.5 mg/dL   GFR calc non Af Amer 74 (L) >90 mL/min   GFR calc Af Amer 86 (L) >90 mL/min    Comment: (NOTE) The eGFR has been calculated using the CKD EPI equation. This calculation has not been validated in all clinical situations. eGFR's persistently <90 mL/min signify possible Chronic Kidney Disease.    Anion gap 12 5 - 15     HEENT: normal Cardio: RRR and no murmur Resp: CTA B/L and unlabored GI: BS positive and NT, ND Extremity:  Pulses positive and No Edema Skin:   Intact Neuro: Alert/Oriented, Cranial Nerve Abnormalities Left central 7, Normal Sensory, Abnormal Motor 0/5 LUE and LLE persistent. Does sense pain. , Dysarthric and left Inattention, right gaze preference better MAS 2-3 Left elbow flexors , MAS 0 in FF     Musc/Skel:  Left hip tender with palpation MSK- mild pain in Left shoulder Gen NAD   Assessment/Plan: 1. 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 Physiatrist is providing close team supervision and 24 hour management of active medical problems listed below. Physiatrist and rehab team continue to assess barriers to discharge/monitor patient progress toward functional and medical goals.      FIM: FIM - Bathing Bathing Steps Patient Completed: Chest, Abdomen, Right upper leg, Left Arm, Left upper leg, Right lower leg (including foot), Left lower leg (including foot) Bathing: 2: Max-Patient completes 3-4 41f 10 parts or 25-49%  FIM - Upper Body Dressing/Undressing Upper body dressing/undressing steps patient completed: Thread/unthread right sleeve of pullover shirt/dresss, Put head through opening of pull over shirt/dress, Pull shirt over trunk, Thread/unthread left sleeve of pullover shirt/dress Upper body dressing/undressing: 4: Min-Patient completed 75 plus % of tasks FIM - Lower Body Dressing/Undressing Lower body dressing/undressing steps patient completed: Thread/unthread right pants leg Lower body dressing/undressing: 2: Max-Patient completed 25-49% of tasks  FIM - Musician Devices: Grab bar or rail for support Toileting: 1: Two helpers  FIM - Radio producer Devices: Bedside commode (and bari stedy) Toilet Transfers: 1-Two helpers, 1-Mechanical lift  FIM - Control and instrumentation engineer Devices:  (STEDI lift) Bed/Chair Transfer: 1: Two helpers  FIM - Locomotion: Wheelchair Distance: 100 Locomotion: Wheelchair: 1: Total Assistance/staff pushes wheelchair (Pt<25%)  FIM - Locomotion: Ambulation Locomotion: Ambulation Assistive Devices: Other (comment) (L shoe cover, L air cast, Mohr Sling on L) Ambulation/Gait Assistance: 1: +2 Total assist Locomotion: Ambulation: 0: Activity did not occur  Comprehension Comprehension Mode: Auditory Comprehension: 7-Follows  complex conversation/direction: With no assist  Expression Expression Mode: Verbal Expression: 7-Expresses complex ideas: With no assist  Social Interaction Social Interaction: 4-Interacts appropriately 75 - 89% of the time - Needs redirection for appropriate language or to initiate interaction.  Problem Solving Problem Solving: 4-Solves basic 75 - 89% of the time/requires cueing 10 - 24% of the time  Memory Memory: 4-Recognizes or recalls 75 - 89% of the time/requires cueing 10 - 24% of the time  Medical Problem List and Plan: 1. Functional deficits secondary to Right MCA infarct with left hemiparesis, dysarthria, dysphagia, and visual spatial deficits- increasing tone in LUE add resting hand splint 2. DVT Prophylaxis/Anticoagulation: Pharmaceutical: Lovenox 3. New onset Headaches/Pain Management: Continue tylenol prn for now.Left ankle OA, tramadol $RemoveBefo'100mg'[id number]$  prior to therapy, avoid NSAIDs secondary to recent CVA, overall improved 5. Neuropsych: This patient is not capable of making decisions on his own behalf. 6. Skin/Wound Care: Routine pressure relief measures. turn q2 h 7. Fluids/Electrolytes/Nutrition: Monitor I/O.Hold IVF, recheck BMET on 12/18 was nl 8. HTN: Will monitor every 8 hours. Currently controlled of medications (and to avoid hypotension). will resume medications as indicated. flomax may lower BP   10 OSA: Continue CPAP (has home machine) whenever asleep. poor compliance, discussed increase CVA risk, ? desat at noc 11. Recent esophagitis/duodenitis due to gastric ulcer: Protonix bid to help promote healing 12. CAD: Has chest wall pain due to poorly Healed sternum. Continue ASA, plavix, lipitor--off imdur and coreg  13.  Left ankle pain with ROM and wt bearing, non progressive, exam with minimal swelling, no erythema, not consistent, xray with mod OA,  -ultracet prn 14.   UTI , enterococcus, sens to amp will cont  Amoxil, likely related to urinary retention LOS  (Days) 27 A FACE TO FACE EVALUATION WAS PERFORMED  Smaran Gaus T 03/14/2014, 9:00 AM

## 2014-03-14 NOTE — Progress Notes (Signed)
Pt has home cpap, needs assistance to be placed on. Helped patient place nasal pillows and filled water chamber, no needs at this time.

## 2014-03-14 NOTE — Progress Notes (Signed)
Physical Therapy Session Note  Patient Details  Name: Gregory Wiley MRN: [id number] Date of Birth: 07-19-1950  Today's Date: 03/14/2014 PT Individual Time: 1520-1551 PT Individual Time Calculation (min): 31 min   Short Term Goals: Week 4:  PT Short Term Goal 1 (Week 4): Pt will perform sit <> supine with max A x 1 with use of hospital bed functions with 25% cues for sequencing.  PT Short Term Goal 2 (Week 4): Patient will initiate transfer bed <> w/c with total A and 25% cues for sequencing.  PT Short Term Goal 3 (Week 4): Patient will maintain static standing x 5 mins at mod A PT Short Term Goal 4 (Week 4): Pt will ambulate x 45' consistently with +2 assist (+3 for chair follow)  Skilled Therapeutic Interventions/Progress Updates:    1:1. Makeup session focusing on functional transfers, bed mobility, and scooting.Pt received seated in w/c with RN,daughter, grandsons present. Pt agreeable to therapy but requesting to return to bed. Pt performed lateral scooting transfer from w/c>bed (to L side) with Total A and multimodal cueing for full anterior weight shift, frequent manual repositioning of LLE per pt request due to pain in L ankle. Once seated EOB, pt performed RUE anterior reaching to facilitate anterior weight shift to initiate scooting to R then L. Pt performed sit>supine with HOB flat using rail with mod A for LLE management and to control trunk descent. Once supine, pt performed scooting to [facility] with R rail and min A, multiple trials, verbal cueing for RLE positioning (as pt unable to utilize LLE due to L ankle pain with weightbearing) and tactile cueing at R knee for increased weightbearing. Departed with pt semi reclined in bed with daughter present, 3 rails up, bed alarm on, and all needs within reach.  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   Restrictions Weight Bearing Restrictions: No Vital Signs: Therapy Vitals Temp: 97.9 F (36.6 C) Temp Source: Oral Pulse Rate: 94 Resp: 16 BP: 125/81 mmHg Patient Position (if appropriate): Sitting Oxygen Therapy SpO2: 97 % O2 Device: Not Delivered Pain: Pain Assessment Pain Assessment: 0-10 Pain Score: 5  Pain Type: Chronic pain Pain Location: Ankle Pain Orientation: Left Pain Descriptors / Indicators: Aching Pain Onset: With Activity Pain Intervention(s): Repositioned  See FIM for current functional status  Therapy/Group: Individual Therapy  Hobble, Malva Cogan 03/14/2014, 3:56 PM

## 2014-03-14 NOTE — Progress Notes (Signed)
Physical Therapy Session Note  Patient Details  Name: Gregory Wiley MRN: [id number] Date of Birth: [birth year]  Today's Date: 03/14/2014 PT Individual Time: 0930-1030 PT Individual Time Calculation (min): 60 min   Short Term Goals: Week 4:  PT Short Term Goal 1 (Week 4): Pt will perform sit <> supine with max A x 1 with use of hospital bed functions with 25% cues for sequencing.  PT Short Term Goal 2 (Week 4): Patient will initiate transfer bed <> w/c with total A and 25% cues for sequencing.  PT Short Term Goal 3 (Week 4): Patient will maintain static standing x 5 mins at mod A PT Short Term Goal 4 (Week 4): Pt will ambulate x 45' consistently with +2 assist (+3 for chair follow)  Skilled Therapeutic Interventions/Progress Updates:   Pt received semi reclined in bed, reluctantly agreeable to therapy but reporting increased L ankle pain after "not moving for 2 days." Pt educated that if the cause of pain is not moving, patient will need to participate in therapy in order to decrease pain. Pt verbalized understanding. PROM to L ankle with increased pain and pt unable to tolerate. Therapist donned pt's socks and L aircast and patient transferred supine > sit with HOB elevated and use of bed rail with mod A. Therapist donned pt's shoes and patient performed bed > w/c lateral scoot transfer to R with increased time, max cues for anterior weight shift and head hips relationship with overall mod A x 1 for transfer and +2 assist due to need to stabilize w/c to prevent from moving. Pt propelled w/c x 75 ft using RLE and mod A for steering/propulsion. Pt reluctantly agreeable to pain medication due to intolerance for LLE WB, RN notified and administered pain medication. With givemohr sling and L shoe cover and L DF wrap assist donned, pt performed sit <> stand x 2 with min A x 2 to stand. With max coaxing, pt ambulated approx 5-6 steps via 3 musketeers technique and therapist manually advancing/stabilizing LLE  and max A for weight shifting. Pt refusing to ambulate further, screaming out for chair. After apologizing to wife for yelling after she brought the chair up behind pt, pt's wife escalated the situation by yelling back at patient and leaving session. Extensive patient education provided to patient regarding need for mobilization and weightbearing on LLE. Pt reports feeling "stoned and like my head is going to fall off" as reasons to refuse further ambulation, continued to educate patient that he has not been on pain medication for several days and it is most likely due to his stroke that he feels different cognitively. Pt needs continued education and would benefit from continued neuropsychology involvement. Pt left sitting in w/c with all needs within reach.   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

## 2014-03-14 NOTE — Progress Notes (Signed)
Social Work Patient ID: Stann Mainland, male   DOB: Aug 20, 1950, 63 y.o.   MRN: [id number] Met with pt and spoke with wife via telephone to discuss both [facility] and [facility] do not feel they can meet his needs, so no bed offer.  Have expanded the search and have three NH interested in taking pt, both [facility] and [facility], wife wants to check them out before choosing one. She feels she is being rushed and doesn't have enough time to  Decide.  She is very disappointed both of their first choices will not take him.  Will await wife's return call.  She left during pt's therapy session due to he was being mean and yelling at her.

## 2014-03-14 NOTE — Progress Notes (Signed)
Pt wears home CPAP unit, but needs assistance placing it on.  RN already placed Pt on for night.

## 2014-03-14 NOTE — Progress Notes (Signed)
Social Work Patient ID: Stann Mainland, male   DOB: Oct 13, 1950, 63 y.o.   MRN: [id number] [place] with wife who is in the process of looking at the facilities who have offered a bed for pt. She wanted worker to check with [facility] for availability, FL2 sent to them also. Have spoken with [facility] who reports she will need to make a decision and accept one of those beds offered.  He can transfer to another facility if bed becomes open or he loses the Weight since this is an issues for [facility].  They can not take any patient over 300 lbs due to their bed and lifting equipment capacity.  Zelda will plan to contact pt's wife tomorrow am, if decision has not been made by Then.

## 2014-03-15 ENCOUNTER — Encounter (HOSPITAL_COMMUNITY): Payer: 59 | Admitting: Occupational Therapy

## 2014-03-15 ENCOUNTER — Inpatient Hospital Stay (HOSPITAL_COMMUNITY): Payer: 59 | Admitting: Occupational Therapy

## 2014-03-15 ENCOUNTER — Inpatient Hospital Stay (HOSPITAL_COMMUNITY): Payer: 59

## 2014-03-15 ENCOUNTER — Inpatient Hospital Stay (HOSPITAL_COMMUNITY): Payer: 59 | Admitting: Speech Pathology

## 2014-03-15 ENCOUNTER — Inpatient Hospital Stay (HOSPITAL_COMMUNITY): Payer: 59 | Admitting: *Deleted

## 2014-03-15 MED ORDER — MUSCLE RUB 10-15 % EX CREA: 1.0000 "application " | TOPICAL_CREAM | Freq: Two times a day (BID) | CUTANEOUS | Status: DC

## 2014-03-15 MED ORDER — FLUTICASONE PROPIONATE 50 MCG/ACT NA SUSP: 2.0000 | Freq: Every day | NASAL | Status: DC

## 2014-03-15 MED ORDER — SENNOSIDES 8.8 MG/5ML PO SYRP: 5.0000 mL | ORAL_SOLUTION | Freq: Two times a day (BID) | ORAL | Status: DC

## 2014-03-15 MED ORDER — ACETAMINOPHEN 325 MG PO TABS: 650.0000 mg | ORAL_TABLET | Freq: Four times a day (QID) | ORAL | Status: DC | PRN

## 2014-03-15 MED ORDER — MUSCLE RUB 10-15 % EX CREA: 1.0000 "application " | TOPICAL_CREAM | Freq: Two times a day (BID) | CUTANEOUS | Status: DC | PRN

## 2014-03-15 MED ORDER — ALPRAZOLAM 0.25 MG PO TABS: 0.1250 mg | ORAL_TABLET | Freq: Three times a day (TID) | ORAL | Status: DC | PRN

## 2014-03-15 MED ORDER — PANTOPRAZOLE SODIUM 40 MG PO TBEC
40.0000 mg | DELAYED_RELEASE_TABLET | Freq: Two times a day (BID) | ORAL | Status: DC
Start: 2014-03-15 — End: 2014-05-12

## 2014-03-15 MED ORDER — MECLIZINE HCL 25 MG PO TABS: 25.0000 mg | ORAL_TABLET | Freq: Three times a day (TID) | ORAL | Status: DC | PRN

## 2014-03-15 MED ORDER — PRO-STAT SUGAR FREE PO LIQD: 30.0000 mL | Freq: Two times a day (BID) | ORAL | Status: DC

## 2014-03-15 MED ORDER — METHYLPHENIDATE HCL 5 MG PO TABS: 5.0000 mg | ORAL_TABLET | Freq: Two times a day (BID) | ORAL | Status: DC

## 2014-03-15 MED ORDER — GABAPENTIN 100 MG PO CAPS
100.0000 mg | ORAL_CAPSULE | Freq: Three times a day (TID) | ORAL | Status: DC
Start: 2014-03-15 — End: 2014-05-12

## 2014-03-15 NOTE — Discharge Summary (Signed)
Physician Discharge Summary  Patient ID: Gregory Wiley MRN: [id number] DOB/AGE: Jun 17, 1950 63 y.o.  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  Pain and swelling.  EXAM: LEFT ANKLE - 2 VIEW  COMPARISON:  None.  FINDINGS: No acute fractures identified. Moderate degenerative changes noted at the ankle joint. No definite joint effusion. A large calcaneal spurs are noted.  IMPRESSION: Degenerative changes but no acute fracture.   Electronically Signed   By: Kalman Jewels M.D.   On: 03/07/14 17:49   Dg Chest Port 1 View  03/14/2014   CLINICAL DATA:  Patient currently being evaluated for skilled nursing facility placement.  EXAM: PORTABLE CHEST - 1 VIEW  COMPARISON:  Portable chest x-ray 06/30/2011. Two-view chest x-ray 08/14/2010.  FINDINGS: Patient severely rotated to the left. Prior sternotomy for CABG. Cardiac silhouette moderately enlarged but stable. Mild pulmonary venous hypertension without overt edema. Lungs clear. Bronchovascular markings normal. No visible pleural effusions. No pneumothorax.  IMPRESSION: Stable cardiomegaly. Pulmonary venous hypertension without overt edema. No acute cardiopulmonary disease.   Electronically Signed   By: Evangeline Dakin M.D.   On: 03/14/2014 16:27    Labs:  Basic Metabolic Panel:  Recent Labs Lab 03/12/14 0357  NA 138  K 4.0  CL 106  CO2 20  GLUCOSE 109*  BUN 8  CREATININE 1.04  CALCIUM 8.9    CBC:  Recent Labs Lab 03/11/14 0612  WBC 5.9  HGB 11.9*  HCT 36.9*  MCV 84.2  PLT 219    CBG: No results for  input(s): GLUCAP in the last 168 hours.  Brief HPI:   Gregory Wiley is a 63 y.o. male with history of CAD, HTN, OSA,  who was admitted on 02/12/14 with left sided weakness with unsteady gait/fall, left facial droop and slurred speech. Patient has been off ASA/plavix due to recent bleeding ulcer but family reported non-compliance with medications as well as CPAP use.Patient had progressive weakness in ED and  MRI brain revealed acute infarct right MCA territory involving the right temporal lobe, insula, and basal ganglia and abrupt occlusion right M1 segment question due to embolus. MRA head/neck revealed bilateral carotid bifurcation patent, moderate stenosis precavernous L-ICA and severe stenosis distal L-VA.  BSS done and patient started on dysphagia 2, nectar liquids due to dysphagia. Neurology recommended resuming ASA and plavix for likely embolic stroke and Loop recorder was placed to rule out cryptogenic cause of stroke. Patient with resultant left sided weakness, lef inattention, moderate dysarthria as well as cognitive deficits. CIR was recommended for follow up therapy.     Hospital Course: Gregory Wiley was admitted to rehab 02/15/2014 for inpatient therapies to consist of PT, ST and OT at least three hours five days a week. Past admission physiatrist, therapy team and rehab RN have worked together to provide customized collaborative inpatient rehab. Blood pressures have been monitored closely with permissive hypertension to allow for adequate perfusion. Blood pressures have been well controlled off home medications. Po intake has improved and patient has been advanced to regular textures. Follow up labs done  revealing stable electrolytes and H/H has been stable on antithrombotics. He was started on ritalin to assist with activation and attention. He continued to complain of left ankle pain and Xrays dione revealing moderate OA.  He was unable to tolerate tramadol due to sedative SE therefore   tylenol and Sportscreme are currently used for symptom management.  Low dose neurontin was added to help with left shoulder pain.  Recommended elevating LUE when in bed/chair to prevent subluxation of shoulder joint. Neuropsychology has been following for support in presence of depressive symptoms. Patient did not want to start antidepressant at this time. Team has been providing ego support during his stay.  He has been compliant with CPAP use and needs assistance with placement. Patient's progress has been slow due to his large R-MCA infarct with significant deficits.  He continues to requires significant assistance as well as encouragement for participation. Family has elected on SNF for further therapy. Bed is available at [facility] and patient was discharged on 03/15/14.   Rehab course: During patient's stay in rehab weekly team conferences were held to monitor patient's progress, set goals and discuss barriers to discharge. Patient has had improvement in activity tolerance, balance, postural control, as well as ability to compensate for deficits. He requires min assist for bed mobility, moderate assist for sitting at EOB and total assist with multimodal cues for w/c to bed transfers. He requires max assist with bathing and min assist with UB dressing tasks. He is able to perform basic tasks with supervision for problems solving as well as left inattention. He requires min to moderate verbal cues for redirection.    Disposition:  [place].   Diet: Dysphagia 3, thin liquids. Needs full staff supervision at meals  Special Instructions: 1. Medications crushed in puree. No Straws. Check for left pocketing.  2.  Toilet patient every 4 hours while awake.  3. Routine pressure relief measures.  4. Family will like a bed with rails.  5. CPAP at nights and whenever asleep.     Medication List    STOP taking these medications        carvedilol 6.25 MG tablet  Commonly known  as:  COREG     furosemide 40 MG tablet  Commonly known as:  LASIX     isosorbide mononitrate 30 MG 24 hr tablet  Commonly known as:  IMDUR     omeprazole 20 MG capsule  Commonly known as:  PRILOSEC     ranolazine 500 MG 12 hr tablet  Commonly known as:  RANEXA     simvastatin 40 MG tablet  Commonly known as:  ZOCOR      TAKE these medications        acetaminophen 325 MG tablet  Commonly known as:  TYLENOL  Take 2 tablets (650 mg total) by mouth every 6 (six) hours as needed for mild pain.     ALPRAZolam 0.25 MG tablet  Commonly known as:  XANAX  Take 0.5 tablets (0.125 mg total) by mouth every 8 (eight) hours as needed for anxiety.     aspirin 81 MG EC tablet  Take 1 tablet (81 mg total) by mouth daily.     atorvastatin 80 MG tablet  Commonly known as:  LIPITOR  Take 1 tablet (80 mg total) by mouth daily at 6 PM.     clopidogrel 75 MG tablet  Commonly known as:  PLAVIX  Take 75 mg by mouth daily.  clopidogrel 75 MG tablet  Commonly known as:  PLAVIX  Take 1 tablet (75 mg total) by mouth daily.     feeding supplement (PRO-STAT SUGAR FREE 64) Liqd  Take 30 mLs by mouth 2 (two) times daily between meals.     fluticasone 50 MCG/ACT nasal spray  Commonly known as:  FLONASE  Place 2 sprays into both nostrils daily.     gabapentin 100 MG capsule  Commonly known as:  NEURONTIN  Take 1 capsule (100 mg total) by mouth 3 (three) times daily.     meclizine 25 MG tablet  Commonly known as:  ANTIVERT  Take 1 tablet (25 mg total) by mouth 3 (three) times daily as needed for dizziness.     methylphenidate 5 MG tablet  Commonly known as:  RITALIN  Take 1 tablet (5 mg total) by mouth 2 (two) times daily with breakfast and lunch.     MUSCLE RUB 10-15 % Crea  Apply 1 application topically 2 (two) times daily. To left ankle and left shoulder     nitroGLYCERIN 0.4 MG SL tablet  Commonly known as:  NITROSTAT  Place 0.4 mg under the tongue every 5 (five) minutes as  needed. For chest pain     pantoprazole 40 MG tablet  Commonly known as:  PROTONIX  Take 1 tablet (40 mg total) by mouth 2 (two) times daily.     sennosides 8.8 MG/5ML syrup  Commonly known as:  SENOKOT  Take 5 mLs by mouth 2 (two) times daily.     Vitamin D (Ergocalciferol) 50000 UNITS Caps capsule  Commonly known as:  DRISDOL  Take 50,000 Units by mouth every 7 (seven) days. Take on Mondays       Follow-up Information    Follow up with Charlett Blake, MD On 04/11/2014.   Specialty:  Physical Medicine and Rehabilitation   Why:  Be there at 10 am  for 10:30 am  appointment   Contact information:   [address] [place] [address] [phone]       Follow up with Antony Contras, MD. Call today.   Specialties:  Neurology, Radiology   Why:  for follow up appointment in 2 weeks.    Contact information:   [address] [place] [address] [phone]       Signed: Bary Leriche 03/15/2014, 12:17 PM

## 2014-03-15 NOTE — Progress Notes (Signed)
Occupational Therapy Session Note  Patient Details  Name: Gregory Wiley MRN: [id number] Date of Birth: 1950-05-22  Today's Date: 03/15/2014 OT Co-Treatment Time: 0830 (co-tx with PT (RV) 0830-0930)-0900 OT Co-Treatment Time Calculation (min): 30 min   Short Term Goals: Week 4:  OT Short Term Goal 1 (Week 4): Pt will perform LB bathing with max assist sit to stand for 2 consecutive sessions. OT Short Term Goal 2 (Week 4): Pt will donn pullover shirt with supervision and no more than one instructional cue.   OT Short Term Goal 3 (Week 4): Pt will perform toilet transfer stand pivot with max assist. OT Short Term Goal 4 (Week 4): Pt will use the LUE as a stabilizer with selfcare tasks with max assist.  OT Short Term Goal 5 (Week 4): Pt will perform walk-in shower transfer with max assist to tub bench or seat.  Skilled Therapeutic Interventions/Progress Updates:    Pt seen for therapeutic co-tx with PT (RV) with focus on bed mobility, functional transfers, activity tolerance, and standing balance. Pt received supine in bed agreeable to therapy with min cues of encouragement. Required total assist for LB dressing while supine in bed with min assist for rolling L and R. Pt completed supine>sit with HOB elevated and increased time. Completed lateral scoot transfer bed>w/c with max assist using Bobath technique to facilitate anterior weight shift throughout transfer. Pt required max cues for head hips relationship and anterior weight shift. Pt propelled self in w/c room>gym, see PT note for details. Engaged in functional mobility using 3 musketeers technique with max cues of encouragement. Pt required min assist for sit<>stand from w/c both trials. Pt required max cues and manual facilitation for upright posture. See PT note for ambulation details. Pt limited by dizziness with BP dropping 84/68 in standing. Educated on importance of completing standing activities with therapist to allow for body to adjust  to positional changes and prevent BP from dropping. Completed lateral scoot transfer w/c<>mat table with max assist and max cues for head hips relationship and anterior weight shift. Pt limited throughout session by L ankle pain, feeling "stoned" and dizziness. Pt returned to room at end of session.  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  Restrictions Weight Bearing Restrictions: No General:   Vital Signs: Therapy Vitals Temp: 98.2 F (36.8 C) Temp Source: Oral Pulse Rate: 97 Resp: 18 BP: 136/84 mmHg Patient Position (if appropriate): Lying Oxygen Therapy SpO2: 98 % O2 Device: Not Delivered Pain: Pain Assessment Pain Score: 0-No pain (unless with therapy) Pain Type: Chronic pain Pain Location: Ankle Pain Orientation: Left Pain Descriptors / Indicators: Aching;Discomfort Pain Intervention(s): Medication (See eMAR) ADL:   Exercises:   Other Treatments:    See FIM for current functional status  Therapy/Group: Individual Therapy  Duayne Cal 03/15/2014, 9:34 AM

## 2014-03-15 NOTE — Progress Notes (Signed)
Occupational Therapy Discharge Summary  Patient Details  Name: Gregory Wiley MRN: [id number] Date of Birth: Mar 07, 1951  Today's Date: 03/15/2014 OT Individual Time: 1401-1431 OT Individual Time Calculation (min): 30 min    Session Note:   Pt transferred to EOB on the right side with mod assist to begin session.  Used tilted stool for pt to work on shoulder flexion with the LUE in slight weightbearing position.  Therapist helped provide mod facilitation at the trunk and left shoulder to help decrease compensatory strategies of pt leaning forward and to the right side when attempting to move the stool.  He was able to demonstrate some shoulder flexion on the stool but reported shoulder pain with flexion greater than 50-60 degrees.  Pt repeatedly stating he wanted to lay down and that his back was bothering him.  Therapist discussed spending a few more minutes on task and that his back was likely hurting him based on the compensatory positions he was trying to maintain with attempted UE use.  At conclusion of session pt required mod assist to transition back to supine with max assist needed to bring the LLE back in the bed.     Patient has met 8 of 13 long term goals due to improved activity tolerance, improved balance, postural control, ability to compensate for deficits, functional use of  LEFT upper and LEFT lower extremity, improved attention and improved awareness.  Patient to discharge at overall Total Assist level.  Patient's care partner unable to provide the necessary physical and cognitive assistance at discharge.    Reasons goals not met: Pt continues to need total assist for all transfers at this time, as well as min to mod assist for grooming tasks with decreased carryover from session to session with regards to hemi techniques.  Dynamic standing balance requires the support of 2 people for safety.    Recommendation:  Patient will benefit from ongoing skilled OT services in skilled  nursing facility setting to continue to advance functional skills in the area of BADL.  Recommend extensive OT to help address current impairments still present including balance, awareness, left side attention, LUE and LLE strength, coordination, and functional use.  He has made good progress on admission from needing 2 to 3 people to transfer via a sliding board to now only needing 2 for squat or even stand pivot transfers.  Sit to stand has improved to a max assist level, however, this continues to be dependent based on the amount of pain he is having in his left ankle.  Left shoulder pain has also increased over the past couple of weeks as pt still demonstrates a 2 finger inferior/anterior subluxation.  Family has been educated on positioning, and PROM exercises and pt has been given a resting hand splint for night time wear as well.  Increased flexor tone is also beginning in the LUE but is reduced after pt is transferred from the supine position.  Currently on Brunnstrum stage II trace shoulder movement in present in the LUE.  Mod instructional cueing is needed for pt to maintain selective attention to selfcare tasks with decreased ability to organize simple bathing activities or remember hemi techniques from one session to the next.  Extensive OT will need to be continued to progress to min assist level, in order for pt to possibly return home with his wife.  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 with the left being the worst  Restrictions Weight Bearing Restrictions: No  Vital Signs Therapy Vitals Temp: 97.6 F (36.4 C) Temp Source: Oral Pulse Rate: 83 Resp: 18 BP: (!) 118/58 mmHg Patient Position (if  appropriate): Lying Oxygen Therapy SpO2: 95 % O2 Device: Not Delivered Pain Pain Assessment Pain Assessment: Faces Pain Type: Chronic pain Pain Location: Ankle Pain Orientation: Left Pain Intervention(s): Repositioned ADL  See FIM scale for details  Vision/Perception  Vision- History Baseline Vision/History: Wears glasses Wears Glasses: Reading only Patient Visual Report: No change from baseline Vision- Assessment Vision Assessment?: Yes Eye Alignment: Impaired (comment) Ocular Range of Motion: Within Functional Limits Tracking/Visual Pursuits: Decreased smoothness of eye movement to LEFT inferior field;Decreased smoothness of eye movement to LEFT superior field Visual Fields: Left visual field deficit (Left visual field deficit much improved. ) Additional Comments: Pt was able to track in all quadrants except maintaining visual fixation in the left visual field.  Visual fields have improved on the left but slight visual field deficit may also be present but needs further evaulation in perimetry testing. Perception Comments: Pt continues to need mod instructional cueing to integrate the LUE into functional tasks as well as remembering to wash it during bathing tasks.  Praxis Praxis-Other Comments: Pt with dressing apraxia noted when attempting to donn pullover shirt.   Cognition Overall Cognitive Status: Impaired/Different from baseline Orientation Level: Oriented X4 Attention: Sustained Focused Attention: Appears intact Sustained Attention: Appears intact Sustained Attention Impairment: Functional basic Memory: Impaired Memory Impairment: Decreased short term memory;Decreased recall of new information Decreased Short Term Memory: Functional basic Awareness: Impaired Awareness Impairment: Anticipatory impairment Problem Solving: Impaired Problem Solving Impairment: Functional basic;Functional complex Safety/Judgment: Impaired Comments: Pt still limited with selective  attention, sequencing, and organization as it relates to performance of selfcare tasks.  He continues to need mod instructional cueing to complete simple bathing tasks.  Hemi dressing techniques are not carried over from session to session.  Sensation Sensation Light Touch: Appears Intact Stereognosis: Not tested Hot/Cold: Appears Intact Proprioception: Appears Intact Additional Comments: Light touch, proprioception, and hot cold intact in the LUE at this time per gross assessment.   Coordination Fine Motor Movements are Fluid and Coordinated: No Coordination and Movement Description: Pt is currently Brunnstrum stage IIin the left arm and stage I in the hand.  He continues to need total assist to incorporate it into any functional tasks at this time.  Motor  Motor Motor: Hemiplegia;Abnormal postural alignment and control Motor - Discharge Observations: Dense L sided hemiplegia, patient with difficulty during standing/ambulation tasks due to increased L ankle pain.  Total assist for integration of the LUE into function with increased anterior/lateral glenohumeral pain secondary to subluxation. Mobility  Bed Mobility Bed Mobility: Rolling Right;Rolling Left;Supine to Sit;Sit to Supine Rolling Right: With rail;3: Mod assist Rolling Right Details: Verbal cues for technique;Manual facilitation for weight shifting;Verbal cues for sequencing Rolling Left: 4: Min assist;With rail Rolling Left Details: Verbal cues for technique;Manual facilitation for weight shifting Sit to Supine: 3: Mod assist;With rail;HOB elevated Sit to Supine - Details: Manual facilitation for weight shifting;Verbal cues for technique;Manual facilitation for placement Transfers Transfers: Sit to Stand;Stand to Sit Sit to Stand: 2: Max assist;From bed;From chair/3-in-1 Sit to Stand Details: Manual facilitation for weight shifting;Verbal cues for precautions/safety;Verbal cues for sequencing Stand to Sit: With upper extremity  assist;To chair/3-in-1;2: Max assist  Stand to Sit Details (indicate cue type and reason): Manual facilitation for weight shifting;Manual facilitation for weight bearing;Verbal cues for technique  Trunk/Postural Assessment  Cervical Assessment Cervical Assessment: Exceptions to [facility] (Pt still maintains slight cervical rotation to the right in sitting.) Thoracic Assessment Thoracic Assessment: Within Functional Limits Lumbar Assessment Lumbar Assessment: Within Functional Limits Postural Control Righting Reactions: Pt still with left trunk passive elongation.  Maintains posterior pelvic tilt in sitting.   Balance Static Sitting Balance Static Sitting - Balance Support: No upper extremity supported Static Sitting - Level of Assistance: 5: Stand by assistance Dynamic Sitting Balance Dynamic Sitting - Balance Support: Right upper extremity supported Dynamic Sitting - Level of Assistance: 4: Min assist Dynamic Sitting - Balance Activities: Reaching for objects Sitting balance - Comments: Pt still with LOB to the left when reaching across midline with the RUE during bathing tasks.  Extremity/Trunk Assessment RUE Assessment RUE Assessment: Within Functional Limits LUE Assessment LUE Assessment: Exceptions to [facility] LUE Strength LUE Overall Strength Comments: Pt with Brunnstrum stage II movement in the left arm and stage I movement in the left hand. Slight flexor tone in the digits, internal rotators, and elbow flexors begining to occur.  Shoulder flexion PROM 0-120 degrees with pt reporting increased pain.beginning at approximately 60 degrees of flexion. Two finger inferior subluxation noted.  Pt currently has resting hand splint for wear at night.  See FIM for current functional status  Rashada Klontz OTR/L 03/15/2014, 4:56 PM

## 2014-03-15 NOTE — Progress Notes (Signed)
Recreational Therapy Discharge Summary Patient Details  Name: Gregory Wiley MRN: [id number] Date of Birth: Apr 15, 1950 Today's Date: 03/15/2014  Long term goals set: 1  Long term goals met: 1  Comments on progress toward goals: Pt is discharging to SNF today for continued therapies & 24 hour assistance.    Pt met min assist level for simple TR tasks seated in w/c with min verbal cues for encouragement.  Reasons for discharge: discharge from hospital Patient/family agrees with progress made and goals achieved: Yes  Carlyne Keehan 03/15/2014, 3:29 PM

## 2014-03-15 NOTE — Progress Notes (Signed)
Social Work Patient ID: Gregory Wiley, male   DOB: [birth year], 63 y.o.   MRN: [id number] Spoke with Michiel Sites from [facility] and they can take pt today.  Awaiting completed discharge summary and will work on transfer today.

## 2014-03-15 NOTE — Plan of Care (Signed)
Problem: RH BOWEL ELIMINATION Goal: RH STG MANAGE BOWEL WITH ASSISTANCE STG Manage Bowel with Mod Assistance.  Outcome: Not Met (add Reason) SNF placement- pt total care with bowels  Problem: RH PAIN MANAGEMENT Goal: RH STG PAIN MANAGED AT OR BELOW PT'S PAIN GOAL < 4  Outcome: Completed/Met Date Met:  03/15/14 Medication used for pain   Problem: RH KNOWLEDGE DEFICIT Goal: RH STG INCREASE KNOWLEDGE OF HYPERTENSION Patient will be able to state s/s of hypo/hypertension, dietary, and medication management with Mod Assist. Will demonstrate proper technique for taking manual blood pressure and heart rate to RN.  Outcome: Not Applicable Date Met:  73/22/02 SNF placement

## 2014-03-15 NOTE — Progress Notes (Signed)
Pt discharged at 1530 to SNF via care link. Belongings with wife.

## 2014-03-15 NOTE — Progress Notes (Signed)
Social Work Patient ID: Gregory Wiley, male   DOB: [birth year], 63 y.o.   MRN: [id number] Both agreeable to setting up ambulance at 2;30 pt does not plan to go to his speech therapy session this afternoon. Gather paperwork and prepare for discharge.

## 2014-03-15 NOTE — Progress Notes (Signed)
Subjective/Complaints: No new issues. Hip sore but better with re-positioning Review of Systems - Negative except above    Poor awareness of deficits secondary to R MCA lg infarct Objective: Vital Signs: Blood pressure 136/84, pulse 97, temperature 98.2 F (36.8 C), temperature source Oral, resp. rate 18, height 6\' 1"  (1.854 m), weight 144.108 kg (317 lb 11.2 oz), SpO2 98 %. Dg Chest Port 1 View  03/14/2014   CLINICAL DATA:  Patient currently being evaluated for skilled nursing facility placement.  EXAM: PORTABLE CHEST - 1 VIEW  COMPARISON:  Portable chest x-ray 06/30/2011. Two-view chest x-ray 08/14/2010.  FINDINGS: Patient severely rotated to the left. Prior sternotomy for CABG. Cardiac silhouette moderately enlarged but stable. Mild pulmonary venous hypertension without overt edema. Lungs clear. Bronchovascular markings normal. No visible pleural effusions. No pneumothorax.  IMPRESSION: Stable cardiomegaly. Pulmonary venous hypertension without overt edema. No acute cardiopulmonary disease.   Electronically Signed   By: Evangeline Dakin M.D.   On: 03/14/2014 16:27   No results found for this or any previous visit (from the past 72 hour(s)).   HEENT: normal Cardio: RRR and no murmur Resp: CTA B/L and unlabored GI: BS positive and NT, ND Extremity:  Pulses positive and No Edema Skin:   Intact Neuro: Alert/Oriented, Cranial Nerve Abnormalities Left central 7, Normal Sensory, Abnormal Motor 0/5 LUE and LLE persistent. Does sense pain. , Dysarthric and left Inattention, right gaze preference better MAS 2-3 Left elbow flexors , MAS 0 in FF     Musc/Skel:  Left hip tender with palpation and ROM MSK- mild pain in Left shoulder Gen NAD   Assessment/Plan: 1. 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 Physiatrist is  providing close team supervision and 24 hour management of active medical problems listed below. Physiatrist and rehab team continue to assess barriers to discharge/monitor patient progress toward functional and medical goals.  SNF pending        FIM: FIM - Bathing Bathing Steps Patient Completed: Chest, Left Arm, Abdomen, Front perineal area Bathing: 2: Max-Patient completes 3-4 12f 10 parts or 25-49%  FIM - Upper Body Dressing/Undressing Upper body dressing/undressing steps patient completed: Thread/unthread right sleeve of pullover shirt/dresss, Put head through opening of pull over shirt/dress, Pull shirt over trunk, Thread/unthread left sleeve of pullover shirt/dress Upper body dressing/undressing: 4: Min-Patient completed 75 plus % of tasks FIM - Lower Body Dressing/Undressing Lower body dressing/undressing steps patient completed: Thread/unthread right pants leg Lower body dressing/undressing: 1: Two helpers  FIM - Musician Devices: Grab bar or rail for support Toileting: 1: Two helpers  FIM - Radio producer Devices: Bedside commode (and bari stedy) Toilet Transfers: 1-Two helpers, 1-Mechanical lift  FIM - Control and instrumentation engineer Devices: Arm rests, Bed rails Bed/Chair Transfer: 3: Sit > Supine: Mod A (lifting assist/Pt. 50-74%/lift 2 legs), 1: Bed > Chair or W/C: Total A (helper does all/Pt. < 25%)  FIM - Locomotion: Wheelchair Distance: 75 Locomotion: Wheelchair: 0: Activity did not occur FIM - Locomotion: Ambulation Locomotion: Ambulation Assistive Devices: Other (comment) (L shoe cover and DF assist ace wrap) Ambulation/Gait Assistance: 1: +2 Total assist Locomotion: Ambulation: 0: Activity did not occur  Comprehension Comprehension Mode: Auditory Comprehension: 4-Understands basic 75 - 89% of the time/requires cueing 10 - 24% of the time  Expression Expression Mode: Verbal Expression:  5-Expresses basic 90%  of the time/requires cueing < 10% of the time.  Social Interaction Social Interaction: 4-Interacts appropriately 75 - 89% of the time - Needs redirection for appropriate language or to initiate interaction.  Problem Solving Problem Solving: 4-Solves basic 75 - 89% of the time/requires cueing 10 - 24% of the time  Memory Memory: 3-Recognizes or recalls 50 - 74% of the time/requires cueing 25 - 49% of the time  Medical Problem List and Plan: 1. Functional deficits secondary to Right MCA infarct with left hemiparesis, dysarthria, dysphagia, and visual spatial deficits- increasing tone in LUE add resting hand splint 2. DVT Prophylaxis/Anticoagulation: Pharmaceutical: Lovenox 3. New onset Headaches/Pain Management: Continue tylenol prn for now.Left ankle OA, tramadol 100mg  prior to therapy, avoid NSAIDs secondary to recent CVA, overall improved 5. Neuropsych: This patient is not capable of making decisions on his own behalf. 6. Skin/Wound Care: Routine pressure relief measures. turn q2 h 7. Fluids/Electrolytes/Nutrition: Monitor I/O.Hold IVF, recheck BMET on 12/18 was nl 8. HTN: Will monitor every 8 hours. Currently controlled of medications (and to avoid hypotension). will resume medications as indicated. flomax may lower BP   10 OSA: Continue CPAP (has home machine) whenever asleep. poor compliance, discussed increase CVA risk, ? desat at noc 11. Recent esophagitis/duodenitis due to gastric ulcer: Protonix bid to help promote healing 12. CAD: Has chest wall pain due to poorly Healed sternum. Continue ASA, plavix, lipitor--off imdur and coreg  13.  Left ankle pain with ROM and wt bearing, non progressive, exam with minimal swelling, no erythema, not consistent, xray with mod OA,  -ultracet prn 14.   UTI , enterococcus, sens to amp will cont  Amoxil, likely related to urinary retention LOS (Days) 28 A FACE TO FACE EVALUATION WAS PERFORMED  Shronda Boeh  T 03/15/2014, 8:35 AM

## 2014-03-15 NOTE — Progress Notes (Signed)
Social Work Discharge Note Discharge Note  The overall goal for the admission was met for:   Discharge location: No-[facility]-SNF  Length of Stay: No-28 DAYS  Discharge activity level: Yes-MOD/MAX LEVEL  Home/community participation: Yes  Services provided included: MD, RD, PT, OT, SLP, RN, CM, TR, Pharmacy, Neuropsych and SW  Financial Services: Private Insurance: [facility]  Follow-up services arranged: Other: NHP  Comments (or additional information):RELUCTANTLY AGREED TO GOING TO [facility] WANTED TO GO TO [facility] BUT THEY CAN NOT TAKE ANYONE OVER 300 LBS AWARE CAN TRANSFER ONCE UNDER 300 LBS-WIFE WILL PURSUE  Patient/Family verbalized understanding of follow-up arrangements: Yes  Individual responsible for coordination of the follow-up plan: LAURA-WIFE  Confirmed correct DME delivered: Gregory Wiley 03/15/2014    Gregory Wiley

## 2014-03-15 NOTE — Progress Notes (Signed)
Physical Therapy Discharge Summary  Patient Details  Name: Gregory Wiley MRN: [id number] Date of Birth: Dec 11, 1950  Patient has met 5 of 6 long term goals due to improved activity tolerance, improved balance, improved postural control, increased strength, increased range of motion, decreased pain, ability to compensate for deficits, functional use of  left upper extremity and left lower extremity, improved attention and improved awareness.  Patient to discharge at a wheelchair level Max Assist-total assist.   Patient's care partner unable to provide the necessary physical and cognitive assistance at discharge.  Reasons goals not met: Patient continues to require assist for w/c propulsion due to setup in tilt in space wheelchair as patient is only able to utilize RLE for propulsion.   Recommendation:  Patient will benefit from ongoing skilled PT services in skilled nursing facility setting to continue to advance safe functional mobility, address ongoing impairments in L ankle pain, balance, standing tolerance, endurance, ability to bear weight through LLE, positioning, postural control, L hemiplegia, attention, awareness, motivation, and minimize fall risk.  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  Restrictions Weight Bearing Restrictions: No Vital Signs Therapy Vitals BP: (!) 84/68 mmHg Patient Position (if appropriate): Standing Pain Pain Assessment Pain Assessment: Faces Pain Score: 10-Worst pain ever Faces Pain Scale: Hurts little more Pain Type: Chronic pain Pain Location: Ankle Pain Orientation: Left Pain Descriptors / Indicators: Aching;Discomfort Pain Onset:  On-going Pain Intervention(s): Repositioned;Emotional support Vision/Perception  Vision - Assessment Eye Alignment: Impaired (comment) Ocular Range of Motion: Within Functional Limits Tracking/Visual Pursuits: Decreased smoothness of eye movement to LEFT inferior field;Decreased smoothness of eye movement to LEFT superior field Additional Comments: Pt was able to track in all quadrants except maintaining visual fixation in the left visual field.  Visual fields have improved on the left but slight visual field deficit may also be present but needs further evaulation in perimetry testing. Perception Comments: Pt continues to need mod instructional cueing to integrate the LUE into functional tasks as well as remembering to wash it during bathing tasks.  Praxis Praxis-Other Comments: Pt with dressing apraxia noted when attempting to donn pullover shirt.   Cognition Overall Cognitive Status: Impaired/Different from baseline Orientation Level: Oriented X4 Attention: Sustained Focused Attention: Appears intact Sustained Attention: Appears intact Sustained Attention Impairment: Functional basic Memory: Impaired Memory Impairment: Decreased short term memory;Decreased recall of new information Decreased Short Term Memory: Functional basic Awareness: Impaired Awareness Impairment: Anticipatory impairment Problem Solving: Impaired Problem Solving Impairment: Functional basic;Functional complex Safety/Judgment: Impaired Comments: Pt still limited with selective attention, sequencing, and organization as it relates to performance of selfcare tasks.  He continues to need mod instructional cueing to complete simple bathing tasks.  Hemi dressing techniques are not carried over from session to session.  Sensation Sensation Light Touch: Appears Intact Stereognosis: Not tested Hot/Cold: Appears Intact Proprioception: Appears Intact Additional Comments: Light touch, proprioception, and hot cold intact  in the LUE at this time per gross assessment.   Coordination Fine Motor Movements are Fluid and Coordinated: No Coordination and Movement Description: Pt is currently Brunnstrum stage IIin the left arm and stage I in the hand.  He continues to need total assist to incorporate it into any functional tasks at this time.  Motor  Motor Motor:  Hemiplegia;Abnormal postural alignment and control Motor - Discharge Observations: Dense L sided hemiplegia, patient with difficulty during standing/ambulation tasks due to increased L ankle pain  Mobility Bed Mobility Bed Mobility: Rolling Right;Rolling Left;Supine to Sit;Sit to Supine Rolling Right: 4: Min assist;With rail Rolling Left: 4: Min assist;With rail Supine to Sit: 3: Mod assist;HOB elevated;With rails Supine to Sit Details: Manual facilitation for weight shifting;Verbal cues for technique Sit to Supine: 3: Mod assist;With rail;HOB elevated Transfers Transfers: Yes Lateral/Scoot Transfers: 3: Mod assist;2: Max assist Lateral/Scoot Transfer Details: Tactile cues for weight shifting;Tactile cues for initiation;Verbal cues for technique;Verbal cues for sequencing;Manual facilitation for weight shifting Locomotion  Ambulation Ambulation: Yes Ambulation/Gait Assistance: 1: +2 Total assist Ambulation Distance (Feet): 5 Feet Assistive device: 2 person hand held assist (3 musketeers technique) Ambulation/Gait Assistance Details: Tactile cues for initiation;Tactile cues for weight shifting;Manual facilitation for weight shifting;Manual facilitation for placement Ambulation/Gait Assistance Details: total A to advance/stabilize LLE in swing and stance phase Gait Gait: Yes Gait Pattern: Impaired Gait Pattern: Step-to pattern;Decreased stride length;Decreased dorsiflexion - left;Decreased weight shift to left;Narrow base of support;Trunk flexed;Decreased trunk rotation Stairs / Additional Locomotion Stairs: No Wheelchair Mobility Wheelchair  Mobility: Yes Wheelchair Assistance: 4: Min assist (backwards for steering) Wheelchair Propulsion: Right lower extremity (due to TIS w/c) Wheelchair Parts Management: Needs assistance Distance: 150  Trunk/Postural Assessment  Cervical Assessment Cervical Assessment: Exceptions to [facility] (Pt still maintains slight cervical rotation to the right in sitting.) Thoracic Assessment Thoracic Assessment: Within Functional Limits Lumbar Assessment Lumbar Assessment: Within Functional Limits Postural Control Righting Reactions: Pt still with left trunk passive elongation.  Maintains posterior pelvic tilt in sitting.   Balance Static Sitting Balance Static Sitting - Balance Support: No upper extremity supported Static Sitting - Level of Assistance: 5: Stand by assistance Dynamic Sitting Balance Dynamic Sitting - Balance Support: Right upper extremity supported Dynamic Sitting - Level of Assistance: 4: Min assist Dynamic Sitting - Balance Activities: Reaching for objects Sitting balance - Comments: Pt still with LOB to the left when reaching across midline with the RUE during bathing tasks.  Extremity Assessment  RUE Assessment RUE Assessment: Within Functional Limits LUE Assessment LUE Assessment: Exceptions to [facility] LUE Strength LUE Overall Strength Comments: Pt with Brunnstrum stage II movement in the left arm and stage I movement in the left hand. Slight flexor tone in the digits, internal rotators, and elbow flexors begining to occur.  Shoulder flexion PROM 0-120 degrees with pt reporting increased pain.beginning at approximately 60 degrees of flexion. Two finger inferior subluxation noted.  Pt currently has resting hand splint for wear at night. RLE Assessment RLE Assessment: Within Functional Limits LLE Assessment LLE Assessment: Exceptions to [facility] LLE Strength LLE Overall Strength: Deficits LLE Overall Strength Comments: no active movement noted except trace hip flexion, unable to tolerate  minimal PROM L ankle DF due to increased pain  See FIM for current functional status  Laretta Alstrom 03/15/2014, 1:03 PM

## 2014-03-15 NOTE — Progress Notes (Signed)
Physical Therapy Session Note  Patient Details  Name: Gregory Wiley MRN: [id number] Date of Birth: January 10, 1951  Today's Date: 03/15/2014 PT Co-Treatment Time: 0900-0930 (Co-treat with OT [id number]) PT Co-Treatment Time Calculation (min): 30 min (full time 60 min)  Short Term Goals: Week 4:  PT Short Term Goal 1 (Week 4): Pt will perform sit <> supine with max A x 1 with use of hospital bed functions with 25% cues for sequencing.  PT Short Term Goal 2 (Week 4): Patient will initiate transfer bed <> w/c with total A and 25% cues for sequencing.  PT Short Term Goal 3 (Week 4): Patient will maintain static standing x 5 mins at mod A PT Short Term Goal 4 (Week 4): Pt will ambulate x 45' consistently with +2 assist (+3 for chair follow)  Skilled Therapeutic Interventions/Progress Updates:   Skilled co-treat with OT focused on bed mobility, transfers, standing tolerance, and ambulation. Pt received semi reclined in bed, agreeable to therapy. See OT note for details regarding LB dressing and rolling. Pt transferred supine > sit with HOB elevated and use of rail with max A and increased time. Seated edge of bed pt c/o dizziness, BP 138/82. Pt performed lateral scoot transfer bed > w/c with max A using Bobath method and +2 assist to stabilize wheelchair, increased time and max cues for anterior weight shift and head hips relationship. Pt propelled w/c using RLE only due to being in tilt in space w/c backwards x 150 ft with min A for steering. Gait training with 3 musketeers technique with therapist advancing and stabilizing LLE during swing and stance phase x 3 ft and x 5 ft with L air cast, DF assist wrap, and shoe cover with +3 assist for w/c follow. Pt requires max verbal/tactile cues for upright posture and therapist facilitating weight shift to L and R. Pt c/o "dizziness and tunnel vision" and stating, "I'm going to pass out." Seated BP 96/57 and standing BP 84/68. Remainder of session focused on lateral  scoot transfers w/c <> mat table to R and L with focus on anterior weight shift, head hips, lateral leans to reposition in w/c and on mat table with mod-max A x 1 for transfer and +2 to stabilize w/c. Pt left sitting in w/c with call bell and phone within reach.   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  Restrictions Weight Bearing Restrictions: No Vital Signs: Therapy Vitals Temp: 98.2 F (36.8 C) Temp Source: Oral Pulse Rate: 97 Resp: 18 BP: (!) 84/68 mmHg Patient Position (if appropriate): Standing Oxygen Therapy SpO2: 98 % O2 Device: Not Delivered Pain: Pain Assessment Pain Score: 10-Worst pain ever Pain Type: Chronic pain Pain Location: Ankle Pain Orientation: Left Pain Descriptors / Indicators: Aching;Discomfort Pain Onset: On-going Pain Intervention(s): Repositioned;Ambulation/increased activity Locomotion : Ambulation Ambulation/Gait Assistance: 1: +2 Total assist Wheelchair Mobility Distance: 150   See FIM for current functional status  Therapy/Group: Co-Treatment with OT  Carney Living A 03/15/2014, 9:51 AM

## 2014-03-15 NOTE — Progress Notes (Signed)
Recreational Therapy Session Note  Patient Details  Name: TAD FANCHER MRN: [id number] Date of Birth: 30-Mar-1950 Today's Date: 03/15/2014  Pain: c/o 2/10 pain, premedicated Skilled Therapeutic Interventions/Progress Updates: Attempted session with pt, but with c/o fatigue due to not sleeping much last night and recent PT session.  Pt falling asleep throughout session.   Ismeal Heider 03/15/2014, 2:58 PM

## 2014-03-15 NOTE — Progress Notes (Signed)
Speech Language Pathology Discharge Summary  Patient Details  Name: Gregory Wiley MRN: [id number] Date of Birth: 01-21-1951   Patient has met 6 of 8 long term goals.  Patient to discharge at overall Min;Mod level.  Reasons goals not met: pt progress is adequate for discharging as it is recommended that he receive ST at SNF   Clinical Impression/Discharge Summary:  Pt made functional gains while inpatient and is discharging having met 6 out of 8 long term goals.  Pt currently requires overall min-mod assist during cognitive tasks due to poor emergent awareness of deficits, decreased visual scanning to the left, and self monitoring and correction of errors.  Additionally, pt benefits from overall min assist-supervision cues for use of swallowing precautions to minimize overt s/s of aspiration with dys 3 solids and thin liquids.  Pt is discharging to SNF where it is recommended that he receive follow up speech therapy to maximize functional independence and reduce burden of care prior to discharge.  It is also recommended that SLP at next venue of care continue family education related to cognitive remediation/compensation and dysphagia.   Care Partner:  Caregiver Able to Provide Assistance: Other (comment) (SNF)     Recommendation:  24 hour supervision/assistance;Skilled Nursing facility  Rationale for SLP Follow Up: Maximize cognitive function and independence;Reduce caregiver burden;Maximize swallowing safety   Equipment: none recommended by SLP    Reasons for discharge: Discharged from hospital   Patient/Family Agrees with Progress Made and Goals Achieved: Yes   See FIM for current functional status  [name] 03/15/2014, 8:13 PM

## 2014-03-15 NOTE — Progress Notes (Signed)
Social Work Patient ID: Gregory Wiley, male   DOB: 1950/04/23, 63 y.o.   MRN: [id number] Pt and wife have decided [name] is the best option out of the bed offers.  Have contacted them and left a message, will try to transfer today. Pt's witnesses were here so completed Advanced Directives-HCPOA, made copies and gave to wife.  Work on transfer to [employer].

## 2014-03-15 NOTE — Plan of Care (Signed)
Problem: RH Grooming Goal: LTG Patient will perform grooming w/assist,cues/equip (OT) LTG: Patient will perform grooming with assist, with/without cues using equipment (OT)  Outcome: Not Met (add Reason) Needs min assist.  Problem: RH Dressing Goal: LTG Patient will perform upper body dressing (OT) LTG Patient will perform upper body dressing with assist, with/without cues (OT).  Outcome: Not Met (add Reason) Needs min assist for donning pullover shirt. Goal: LTG Patient will perform lower body dressing w/assist (OT) LTG: Patient will perform lower body dressing with assist, with/without cues in positioning using equipment (OT)  Outcome: Not Met (add Reason) Total assist needed for LB dressing.  Problem: RH Toilet Transfers Goal: LTG Patient will perform toilet transfers w/assist (OT) LTG: Patient will perform toilet transfers with assist, with/without cues using equipment (OT)  Outcome: Not Met (add Reason) Needs total assist   Problem: RH Balance Goal: LTG Patient will maintain dynamic standing with ADLs (OT) LTG: Patient will maintain dynamic standing balance with assist during activities of daily living (OT)  Outcome: Not Met (add Reason) Needs total assist +2

## 2014-03-15 NOTE — Progress Notes (Signed)
Occupational Therapy Session Note  Patient Details  Name: Gregory Wiley MRN: [id number] Date of Birth: 25-Feb-1951  Today's Date: 03/15/2014 OT Individual Time: 1101-1203 OT Individual Time Calculation (min): 62 min    Short Term Goals: Week 2:  OT Short Term Goal 1 (Week 2): Pt will perform toilet transfer sliding board or squat pivot with total assist +2 (pt 25%) to bariatric drop arm commode.  OT Short Term Goal 1 - Progress (Week 2): Met OT Short Term Goal 2 (Week 2): Pt will roll to the right side in bed during LB selfcare with max assist and mod instructional cueing for sequencing. OT Short Term Goal 2 - Progress (Week 2): Not met OT Short Term Goal 3 (Week 2): Pt will perform UB dressing with min assist and mod instructional cueing.   OT Short Term Goal 3 - Progress (Week 2): Not met OT Short Term Goal 4 (Week 2): Pt will be able to perform LB bathing sit to stand with total assist +2 (pt 30%).   OT Short Term Goal 4 - Progress (Week 2): Met OT Short Term Goal 5 (Week 2): Pt will locate bathing and grooming items left of midline with no more than mod questioning cues. . OT Short Term Goal 5 - Progress (Week 2): Met Week 4:  OT Short Term Goal 1 (Week 4): Pt will perform LB bathing with max assist sit to stand for 2 consecutive sessions. OT Short Term Goal 2 (Week 4): Pt will donn pullover shirt with supervision and no more than one instructional cue.   OT Short Term Goal 3 (Week 4): Pt will perform toilet transfer stand pivot with max assist. OT Short Term Goal 4 (Week 4): Pt will use the LUE as a stabilizer with selfcare tasks with max assist.  OT Short Term Goal 5 (Week 4): Pt will perform walk-in shower transfer with max assist to tub bench or seat.  Skilled Therapeutic Interventions/Progress Updates:    Pt performed shower and dressing this session.  Increased pain noted in the left ankle compared to last visit from this therapist.  Pt yelling out with RLE movement and  positioning.  He was able to transfer stand pivot to the shower with total assist.  UB bathing with min assist and LB bathing with min assist as well from sitting position.  Utilized LH sponge for washing his feet bilaterally and therapist provided total assist for peri washing in sitting.  Used Steady for transfer out to the wheelchair secondary to pt's increased difficulty with stand pivot to the shower seat initially.  Utilized Secondary school teacher for donning shorts over feet.  He needed total assist for donning the left side, but he was able to donn the right.  Max assist for sit to stand and standing balance while pulling them over his hips.  He still continues to need mod instructional cueing to [employer] as he attempts to thread the left arm opposite of the direction it needs to go.   Total assist to donn socks, left ankle brace, and shoes.    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

## 2014-03-16 ENCOUNTER — Non-Acute Institutional Stay (SKILLED_NURSING_FACILITY): Payer: 59 | Admitting: Internal Medicine

## 2014-03-16 DIAGNOSIS — I10 Essential (primary) hypertension: Secondary | ICD-10-CM

## 2014-03-16 DIAGNOSIS — E785 Hyperlipidemia, unspecified: Secondary | ICD-10-CM

## 2014-03-16 DIAGNOSIS — R1314 Dysphagia, pharyngoesophageal phase: Secondary | ICD-10-CM

## 2014-03-16 DIAGNOSIS — I63411 Cerebral infarction due to embolism of right middle cerebral artery: Secondary | ICD-10-CM

## 2014-03-16 DIAGNOSIS — Z9989 Dependence on other enabling machines and devices: Secondary | ICD-10-CM

## 2014-03-16 DIAGNOSIS — G4733 Obstructive sleep apnea (adult) (pediatric): Secondary | ICD-10-CM

## 2014-03-16 NOTE — Progress Notes (Signed)
MRN: [id number] Name: Gregory Wiley  Sex: male Age: 63 y.o. DOB: Dec 01, 1950  [facility]: [facility] Facility/Room: 103 Level Of Care: SNF Provider: Inocencio Homes D Emergency Contacts: Extended Emergency Contact Information Primary Emergency Contact: Fleet,Laura Address: [address], [place] [address] [place] Phone: [phone] Mobile Phone: [phone] Relation: Spouse Secondary Emergency Contact: [place] Mobile Phone: [phone] Relation: Daughter    Allergies: Diphenhydramine hcl  Chief Complaint  Patient presents with  . New Admit To SNF    HPI: Patient is 63 y.o. 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  Past Medical History  Diagnosis Date  . Coronary artery disease     stent, then CABG 07/20/10  . MI (myocardial infarction)     NSTEMI- last cath 06/2011-Stent to LCX-DES, last nuc 07/30/11 low risk  . Diabetes mellitus     hx of, diet controlled  . Hypertension   . Hyperlipidemia   . Obesity (BMI 30-39.9)   . OSA on CPAP     since July 2013  . H/O cardiomyopathy     ischemic, now last echo 07/30/11, EF 38%W  . Diverticulosis   . Internal hemorrhoids   . Tubular adenoma of colon   . Plantar fasciitis of left foot   . Stroke     Past Surgical History  Procedure Laterality Date  . Coronary artery bypass graft  07/20/2010     LIMA-LAD; VG-ACUTE MARG of RCA; Seq VG-distal RCA & then pda  . Coronary angioplasty with stent placement  07/01/2011    DES-Resolute to native LCX  . Coronary angioplasty with stent placement  12/01/2007    COMPLEX 5 LESION PCI INCLUDING CUTTING BALLOON AND 4 CYPHER DESs  . Hernia repair  2006  . Knee surgery Right   . Hand surgery      to take glass out  . Colonoscopy N/A 02/10/2013    Procedure: COLONOSCOPY;  Surgeon: Ladene Artist, MD;  Location:  [facility] ENDOSCOPY;  Service: Endoscopy;  Laterality: N/A;  . Retinal detachment surgery    . Loop recorder implant  02/15/2014    MDT LINQ implanted by Dr Rayann Heman for cryptogenic stroke  . Tee without cardioversion N/A 02/15/2014    Procedure: TRANSESOPHAGEAL ECHOCARDIOGRAM (TEE);  Surgeon: Josue Hector, MD;  Location: [facility] ENDOSCOPY;  Service: Cardiovascular;  Laterality: N/A;  . Left heart catheterization with coronary/graft angiogram  07/01/2011    Procedure: LEFT HEART CATHETERIZATION WITH Beatrix Fetters;  Surgeon: Sanda Klein, MD;  Location: [facility] CATH LAB;  Service: Cardiovascular;;  . Percutaneous coronary stent intervention (pci-s) Right 07/01/2011    Procedure: PERCUTANEOUS CORONARY STENT INTERVENTION (PCI-S);  Surgeon: Sanda Klein, MD;  Location[facility] CATH LAB;  Service: Cardiovascular;  Laterality: Right;      Medication List       This list is accurate as of: 03/16/14 11:59 PM.  Always use your most recent med list.               acetaminophen 325 MG tablet  Commonly known as:  TYLENOL  Take 2 tablets (650 mg total) by mouth every 6 (six) hours as needed for mild pain.     ALPRAZolam 0.25 MG tablet  Commonly known as:  XANAX  Take 0.5 tablets (0.125 mg total) by  mouth every 8 (eight) hours as needed for anxiety.     aspirin 81 MG EC tablet  Take 1 tablet (81 mg total) by mouth daily.     atorvastatin 80 MG tablet  Commonly known as:  LIPITOR  Take 1 tablet (80 mg total) by mouth daily at 6 PM.     clopidogrel 75 MG tablet  Commonly known as:  PLAVIX  Take 75 mg by mouth daily.     clopidogrel 75 MG tablet  Commonly known as:  PLAVIX  Take 1 tablet (75 mg total) by mouth daily.     feeding supplement (PRO-STAT SUGAR FREE 64) Liqd  Take 30 mLs by mouth 2 (two) times daily between meals.     fluticasone 50 MCG/ACT nasal spray  Commonly known as:  FLONASE  Place 2 sprays into both nostrils daily.     gabapentin 100 MG capsule  Commonly known as:   NEURONTIN  Take 1 capsule (100 mg total) by mouth 3 (three) times daily.     meclizine 25 MG tablet  Commonly known as:  ANTIVERT  Take 1 tablet (25 mg total) by mouth 3 (three) times daily as needed for dizziness.     methylphenidate 5 MG tablet  Commonly known as:  RITALIN  Take 1 tablet (5 mg total) by mouth 2 (two) times daily with breakfast and lunch.     MUSCLE RUB 10-15 % Crea  Apply 1 application topically 2 (two) times daily. To left ankle and left shoulder     nitroGLYCERIN 0.4 MG SL tablet  Commonly known as:  NITROSTAT  Place 0.4 mg under the tongue every 5 (five) minutes as needed. For chest pain     pantoprazole 40 MG tablet  Commonly known as:  PROTONIX  Take 1 tablet (40 mg total) by mouth 2 (two) times daily.     sennosides 8.8 MG/5ML syrup  Commonly known as:  SENOKOT  Take 5 mLs by mouth 2 (two) times daily.     Vitamin D (Ergocalciferol) 50000 UNITS Caps capsule  Commonly known as:  DRISDOL  Take 50,000 Units by mouth every 7 (seven) days. Take on Mondays        No orders of the defined types were placed in this encounter.     There is no immunization history on file for this patient.  History  Substance Use Topics  . Smoking status: Former Research scientist (life sciences)  . Smokeless tobacco: Never Used  . Alcohol Use: Yes     Comment: 1-2 times per year    Family history is noncontributory    Review of Systems  DATA OBTAINED: from patient, nurse, family member GENERAL:  no fevers, fatigue, appetite changes SKIN: No itching, rash or wounds EYES: No eye pain, redness, discharge EARS: No earache, tinnitus, change in hearing NOSE: No congestion, drainage or bleeding  MOUTH/THROAT: No mouth or tooth pain, No sore throat RESPIRATORY: No cough, wheezing, SOB CARDIAC: No chest pain, palpitations, lower extremity edema  GI: No abdominal pain, No N/V/D or constipation, No heartburn or reflux  GU: No dysuria, frequency or urgency, or incontinence  MUSCULOSKELETAL: No  unrelieved bone/joint pain NEUROLOGIC: No headache, dizziness  PSYCHIATRIC: No overt anxiety or sadness, No behavior issue.   Filed Vitals:   03/16/14 1938  BP: 120/80  Pulse: 78  Temp: 96.9 F (36.1 C)  Resp: 18    Physical Exam  GENERAL APPEARANCE: Alert, conversant,  No acute distress, obese SKIN: No diaphoresis rash HEAD: Normocephalic, atraumatic  EYES: Conjunctiva/lids clear. Pupils round, reactive. EOMs intact.  EARS: External exam WNL, canals clear. Hearing grossly normal.  NOSE: No deformity or discharge.  MOUTH/THROAT: Lips w/o lesions  RESPIRATORY: Breathing is even, unlabored. Lung sounds are clear   CARDIOVASCULAR: Heart RRR no murmurs, rubs or gallops. No peripheral edema.   GASTROINTESTINAL: Abdomen is soft, non-tender, not distended w/ normal bowel sounds. GENITOURINARY: Bladder non tender, not distended  MUSCULOSKELETAL: No abnormal joints or musculature NEUROLOGIC:  Cranial nerves 2-12 grossly intact ;L side weakness  PSYCHIATRIC: Mood and affect appropriate to situation, no behavioral issues  Patient Active Problem List   Diagnosis Date Noted  . Left-sided neglect 03/09/2014  . Dysphagia, pharyngoesophageal phase 03/02/2014  . Arthralgia of left ankle 02/24/2014  . Hemiplegia affecting left dominant side 02/16/2014  . 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 Benign neoplasm of colon 02/10/2013  . Special screening for malignant neoplasms, colon 02/10/2013  . OSA on CPAP 01/06/2013  . S/P angioplasty with DES to CFX 07/01/11 07/02/2011  . Sleep apnea, "cant afford C-pap" 07/02/2011  . CAD, RCA PCI '09, 10/11, CABG X 4 5/12 06/30/2011  . Hypertension, B/P has been low this admission 06/30/2011  . Hyperlipidemia 06/30/2011  . Morbid obesity 06/30/2011  . NSTEMI, 06/29/11 06/30/2011  . 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 Ischemic cardiomyopathy, EF 35-40 2D May 2012 06/30/2011  . CHF, acute, mild  06/30/2011    CBC    Component Value Date/Time   WBC 5.9 03/11/2014 0612   RBC 4.38 03/11/2014 0612   HGB 11.9* 03/11/2014 0612   HCT 36.9* 03/11/2014 0612   PLT 219 03/11/2014 0612   MCV 84.2 03/11/2014 0612   LYMPHSABS 1.8 02/16/2014 0455   MONOABS 1.0 02/16/2014 0455   EOSABS 0.1 02/16/2014 0455   BASOSABS 0.0 02/16/2014 0455    CMP     Component Value Date/Time   NA 138 03/12/2014 0357   K 4.0 03/12/2014 0357   CL 106 03/12/2014 0357   CO2 20 03/12/2014 0357   GLUCOSE 109* 03/12/2014 0357   BUN 8 03/12/2014 0357   CREATININE 1.04 03/12/2014 0357   CREATININE 1.12 01/04/2013 1206   CALCIUM 8.9 03/12/2014 0357   PROT 7.2 02/16/2014 0455   ALBUMIN 3.2* 02/16/2014 0455   AST 21 02/16/2014 0455   ALT 16 02/16/2014 0455   ALKPHOS 73 02/16/2014 0455   BILITOT 0.7 02/16/2014 0455   GFRNONAA 74* 03/12/2014 0357   GFRAA 86* 03/12/2014 0357    Assessment and Plan  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 left sided weakness with unsteady gait/fall, left facial droop and slurred speech. Patient has been off ASA/plavix due to recent bleeding ulcer but family reported non-compliance with medications as well as CPAP use.Patient had progressive weakness in ED and MRI brain revealed acute infarct right MCA territory involving the right temporal lobe, insula, and basal ganglia and abrupt occlusion right M1 segment question due to embolus. MRA head/neck revealed bilateral carotid bifurcation patent, moderate stenosis precavernous L-ICA and severe stenosis distal L-VA. BSS done and patient started on dysphagia 2, nectar liquids due to dysphagia. Neurology recommended resuming ASA and plavix for likely embolic stroke and Loop recorder was placed to rule out cryptogenic cause of stroke. Patient with resultant left sided weakness, lef inattention, moderate dysarthria as well as cognitive deficits. CIR was recommended for follow up therapy.   OSA on CPAP Cont  CPAP  Hypertension, B/P has been low this admission Controlled without  meds  Dysphagia, pharyngoesophageal phase Dysphagia 2, nectar thick liquids  Morbid obesity Proper diet  Hyperlipidemia Cont Lipitor 80 mg;pt has been non compliant so numbers not indicative that dose is not apprppriate- LDL 120, HDL 40    Hisham Provence, Noah Delaine, MD

## 2014-03-17 ENCOUNTER — Ambulatory Visit (INDEPENDENT_AMBULATORY_CARE_PROVIDER_SITE_OTHER): Payer: 59 | Admitting: *Deleted

## 2014-03-17 DIAGNOSIS — I63411 Cerebral infarction due to embolism of right middle cerebral artery: Secondary | ICD-10-CM

## 2014-03-18 DIAGNOSIS — I2699 Other pulmonary embolism without acute cor pulmonale: Secondary | ICD-10-CM

## 2014-03-18 HISTORY — DX: Other pulmonary embolism without acute cor pulmonale: I26.99

## 2014-03-21 ENCOUNTER — Encounter: Payer: Self-pay | Admitting: Internal Medicine

## 2014-03-21 NOTE — Assessment & Plan Note (Signed)
left sided weakness with unsteady gait/fall, left facial droop and slurred speech. Patient has been off ASA/plavix due to recent bleeding ulcer but family reported non-compliance with medications as well as CPAP use.Patient had progressive weakness in ED and MRI brain revealed acute infarct right MCA territory involving the right temporal lobe, insula, and basal ganglia and abrupt occlusion right M1 segment question due to embolus. MRA head/neck revealed bilateral carotid bifurcation patent, moderate stenosis precavernous L-ICA and severe stenosis distal L-VA. BSS done and patient started on dysphagia 2, nectar liquids due to dysphagia. Neurology recommended resuming ASA and plavix for likely embolic stroke and Loop recorder was placed to rule out cryptogenic cause of stroke. Patient with resultant left sided weakness, lef inattention, moderate dysarthria as well as cognitive deficits. CIR was recommended for follow up therapy.

## 2014-03-21 NOTE — Assessment & Plan Note (Signed)
Proper diet

## 2014-03-21 NOTE — Assessment & Plan Note (Signed)
Cont Lipitor 80 mg;pt has been non compliant so numbers not indicative that dose is not apprppriate- LDL 120, HDL 40

## 2014-03-21 NOTE — Assessment & Plan Note (Signed)
Cont CPAP

## 2014-03-21 NOTE — Assessment & Plan Note (Signed)
Controlled without meds  

## 2014-03-21 NOTE — Assessment & Plan Note (Signed)
Dysphagia 2, nectar thick liquids

## 2014-03-25 NOTE — Progress Notes (Signed)
Loop recorder 

## 2014-04-04 LAB — MDC_IDC_ENUM_SESS_TYPE_REMOTE

## 2014-04-07 ENCOUNTER — Telehealth: Payer: Self-pay | Admitting: *Deleted

## 2014-04-07 NOTE — Telephone Encounter (Signed)
Spoke to wife about his PV and recall EGD. She states he has had a massive stroke and is paralyzed to his left side and is having to use a wheelchair and he has no transportation here for an office visit at this time.  States he is at [facility]. Instructed wife to call and schedule an OV after he is better to be evaluated for future EGD.  Lelan Pons PV

## 2014-04-07 NOTE — Telephone Encounter (Signed)
Definitely needs an OV.

## 2014-04-07 NOTE — Telephone Encounter (Signed)
Dr Fuller Plan, This pt is scheduled for an EGD with you on 05-03-14 for a follow up on a gastric ulcer. His initial EGD was 02-02-2014 with gastric ulcer, stricture, and hiatal hernia. His recall notes state to hold his plavix for 5 days before his procedure but on 02-12-2014 he had a large right middle artery stroke with left side deficits.  He is in rehab as per the notes  In the computer.. Should he hold this plavix x5 days and proceed as scheduled or does he need an Ov?  Thanks for your time, Marijean Niemann

## 2014-04-11 ENCOUNTER — Emergency Department (HOSPITAL_COMMUNITY): Payer: 59

## 2014-04-11 ENCOUNTER — Inpatient Hospital Stay (HOSPITAL_COMMUNITY)
Admission: EM | Admit: 2014-04-11 | Discharge: 2014-04-13 | DRG: 176 | Disposition: A | Discharge status: Skilled Nursing Facility | Payer: 59 | Attending: Family Medicine | Admitting: Family Medicine

## 2014-04-11 ENCOUNTER — Other Ambulatory Visit: Payer: Self-pay | Admitting: *Deleted

## 2014-04-11 ENCOUNTER — Encounter (HOSPITAL_COMMUNITY): Payer: Self-pay | Admitting: Emergency Medicine

## 2014-04-11 ENCOUNTER — Inpatient Hospital Stay: Payer: 59 | Admitting: Physical Medicine & Rehabilitation

## 2014-04-11 ENCOUNTER — Non-Acute Institutional Stay (SKILLED_NURSING_FACILITY): Payer: 59 | Admitting: Internal Medicine

## 2014-04-11 DIAGNOSIS — E669 Obesity, unspecified: Secondary | ICD-10-CM | POA: Diagnosis present

## 2014-04-11 DIAGNOSIS — R0902 Hypoxemia: Secondary | ICD-10-CM | POA: Diagnosis present

## 2014-04-11 DIAGNOSIS — Z7982 Long term (current) use of aspirin: Secondary | ICD-10-CM

## 2014-04-11 DIAGNOSIS — Z7902 Long term (current) use of antithrombotics/antiplatelets: Secondary | ICD-10-CM | POA: Diagnosis not present

## 2014-04-11 DIAGNOSIS — G4733 Obstructive sleep apnea (adult) (pediatric): Secondary | ICD-10-CM | POA: Diagnosis present

## 2014-04-11 DIAGNOSIS — I255 Ischemic cardiomyopathy: Secondary | ICD-10-CM | POA: Diagnosis present

## 2014-04-11 DIAGNOSIS — E785 Hyperlipidemia, unspecified: Secondary | ICD-10-CM | POA: Diagnosis present

## 2014-04-11 DIAGNOSIS — Z955 Presence of coronary angioplasty implant and graft: Secondary | ICD-10-CM | POA: Diagnosis not present

## 2014-04-11 DIAGNOSIS — I2699 Other pulmonary embolism without acute cor pulmonale: Principal | ICD-10-CM | POA: Diagnosis present

## 2014-04-11 DIAGNOSIS — I509 Heart failure, unspecified: Secondary | ICD-10-CM | POA: Diagnosis present

## 2014-04-11 DIAGNOSIS — Z6841 Body Mass Index (BMI) 40.0 and over, adult: Secondary | ICD-10-CM

## 2014-04-11 DIAGNOSIS — Z87891 Personal history of nicotine dependence: Secondary | ICD-10-CM | POA: Diagnosis not present

## 2014-04-11 DIAGNOSIS — I69354 Hemiplegia and hemiparesis following cerebral infarction affecting left non-dominant side: Secondary | ICD-10-CM | POA: Diagnosis not present

## 2014-04-11 DIAGNOSIS — Z8249 Family history of ischemic heart disease and other diseases of the circulatory system: Secondary | ICD-10-CM | POA: Diagnosis not present

## 2014-04-11 DIAGNOSIS — I10 Essential (primary) hypertension: Secondary | ICD-10-CM | POA: Diagnosis present

## 2014-04-11 DIAGNOSIS — R000 Tachycardia, unspecified: Secondary | ICD-10-CM | POA: Diagnosis present

## 2014-04-11 DIAGNOSIS — D72829 Elevated white blood cell count, unspecified: Secondary | ICD-10-CM | POA: Diagnosis present

## 2014-04-11 DIAGNOSIS — I252 Old myocardial infarction: Secondary | ICD-10-CM

## 2014-04-11 DIAGNOSIS — R079 Chest pain, unspecified: Secondary | ICD-10-CM

## 2014-04-11 DIAGNOSIS — Z7951 Long term (current) use of inhaled steroids: Secondary | ICD-10-CM

## 2014-04-11 DIAGNOSIS — I2510 Atherosclerotic heart disease of native coronary artery without angina pectoris: Secondary | ICD-10-CM | POA: Diagnosis present

## 2014-04-11 DIAGNOSIS — R0602 Shortness of breath: Secondary | ICD-10-CM

## 2014-04-11 DIAGNOSIS — Z66 Do not resuscitate: Secondary | ICD-10-CM | POA: Diagnosis present

## 2014-04-11 DIAGNOSIS — Z951 Presence of aortocoronary bypass graft: Secondary | ICD-10-CM | POA: Diagnosis not present

## 2014-04-11 DIAGNOSIS — Z79899 Other long term (current) drug therapy: Secondary | ICD-10-CM

## 2014-04-11 LAB — CBC
HCT: 43.3 % (ref 39.0–52.0)
Hemoglobin: 14.3 g/dL (ref 13.0–17.0)
MCH: 27.2 pg (ref 26.0–34.0)
MCHC: 33 g/dL (ref 30.0–36.0)
MCV: 82.3 fL (ref 78.0–100.0)
Platelets: 210 10*3/uL (ref 150–400)
RBC: 5.26 MIL/uL (ref 4.22–5.81)
RDW: 14.6 % (ref 11.5–15.5)
WBC: 10.9 10*3/uL — ABNORMAL HIGH (ref 4.0–10.5)

## 2014-04-11 LAB — TROPONIN I: Troponin I: 0.17 ng/mL — ABNORMAL HIGH (ref <0.031)

## 2014-04-11 LAB — BASIC METABOLIC PANEL
Anion gap: 11 (ref 5–15)
BUN: 11 mg/dL (ref 6–23)
CO2: 21 mmol/L (ref 19–32)
Calcium: 9.1 mg/dL (ref 8.4–10.5)
Chloride: 105 mmol/L (ref 96–112)
Creatinine, Ser: 0.83 mg/dL (ref 0.50–1.35)
GFR calc Af Amer: >90 mL/min (ref >90)
GFR calc non Af Amer: >90 mL/min (ref >90)
Glucose, Bld: 126 mg/dL — ABNORMAL HIGH (ref 70–99)
Potassium: 4.1 mmol/L (ref 3.5–5.1)
Sodium: 137 mmol/L (ref 135–145)

## 2014-04-11 LAB — I-STAT TROPONIN, ED: Troponin i, poc: 0.19 ng/mL (ref 0.00–0.08)

## 2014-04-11 LAB — D-DIMER, QUANTITATIVE (NOT AT ARMC): D-Dimer, Quant: 6.85 ug/mL-FEU — ABNORMAL HIGH (ref 0.00–0.48)

## 2014-04-11 MED ORDER — METHYLPHENIDATE HCL 5 MG PO TABS: ORAL_TABLET | ORAL | Status: DC

## 2014-04-11 MED ORDER — GABAPENTIN 100 MG PO CAPS
100.0000 mg | ORAL_CAPSULE | Freq: Three times a day (TID) | ORAL | Status: DC
  Administered 2014-04-12 (×3): 100 mg via ORAL
  Filled 2014-04-11 (×4): qty 1

## 2014-04-11 MED ORDER — ATORVASTATIN CALCIUM 80 MG PO TABS
80.0000 mg | ORAL_TABLET | Freq: Every day | ORAL | Status: DC
  Administered 2014-04-12 – 2014-04-13 (×2): 80 mg via ORAL
  Filled 2014-04-11 (×2): qty 1

## 2014-04-11 MED ORDER — HEPARIN BOLUS VIA INFUSION
5000.0000 [IU] | Freq: Once | INTRAVENOUS | Status: AC
  Administered 2014-04-11: 5000 [IU] via INTRAVENOUS
  Filled 2014-04-11: qty 5000

## 2014-04-11 MED ORDER — ACETAMINOPHEN 325 MG PO TABS: 650.0000 mg | ORAL_TABLET | Freq: Four times a day (QID) | ORAL | Status: DC | PRN

## 2014-04-11 MED ORDER — NITROGLYCERIN 0.4 MG SL SUBL: 0.4000 mg | SUBLINGUAL_TABLET | SUBLINGUAL | Status: DC | PRN

## 2014-04-11 MED ORDER — SODIUM CHLORIDE 0.9 % IJ SOLN
3.0000 mL | Freq: Two times a day (BID) | INTRAMUSCULAR | Status: DC
  Administered 2014-04-12 – 2014-04-13 (×3): 3 mL via INTRAVENOUS

## 2014-04-11 MED ORDER — HEPARIN (PORCINE) IN NACL 100-0.45 UNIT/ML-% IJ SOLN
1700.0000 [IU]/h | INTRAMUSCULAR | Status: DC
  Administered 2014-04-11: 1950 [IU]/h via INTRAVENOUS
  Administered 2014-04-12 (×2): 1700 [IU]/h via INTRAVENOUS
  Filled 2014-04-11 (×3): qty 250

## 2014-04-11 MED ORDER — IOHEXOL 350 MG/ML SOLN
100.0000 mL | Freq: Once | INTRAVENOUS | Status: AC | PRN
  Administered 2014-04-11: 100 mL via INTRAVENOUS

## 2014-04-11 MED ORDER — FLUTICASONE PROPIONATE 50 MCG/ACT NA SUSP
2.0000 | Freq: Every day | NASAL | Status: DC
  Administered 2014-04-12 – 2014-04-13 (×2): 2 via NASAL
  Filled 2014-04-11: qty 16

## 2014-04-11 MED ORDER — INFLUENZA VAC SPLIT QUAD 0.5 ML IM SUSY
0.5000 mL | PREFILLED_SYRINGE | INTRAMUSCULAR | Status: AC
  Administered 2014-04-13: 0.5 mL via INTRAMUSCULAR
  Filled 2014-04-11: qty 0.5

## 2014-04-11 MED ORDER — CLOPIDOGREL BISULFATE 75 MG PO TABS: 75.0000 mg | ORAL_TABLET | Freq: Every day | ORAL | Status: DC

## 2014-04-11 MED ORDER — ALPRAZOLAM 0.25 MG PO TABS: 0.1250 mg | ORAL_TABLET | Freq: Three times a day (TID) | ORAL | Status: DC | PRN

## 2014-04-11 MED ORDER — MECLIZINE HCL 25 MG PO TABS
25.0000 mg | ORAL_TABLET | Freq: Three times a day (TID) | ORAL | Status: DC | PRN
Start: 2014-04-11 — End: 2014-04-13

## 2014-04-11 MED ORDER — METHYLPHENIDATE HCL 5 MG PO TABS
5.0000 mg | ORAL_TABLET | Freq: Two times a day (BID) | ORAL | Status: DC
  Administered 2014-04-12 – 2014-04-13 (×4): 5 mg via ORAL
  Filled 2014-04-11 (×4): qty 1

## 2014-04-11 MED ORDER — PANTOPRAZOLE SODIUM 40 MG PO TBEC
40.0000 mg | DELAYED_RELEASE_TABLET | Freq: Two times a day (BID) | ORAL | Status: DC
  Administered 2014-04-12 – 2014-04-13 (×4): 40 mg via ORAL
  Filled 2014-04-11 (×4): qty 1

## 2014-04-11 MED ORDER — ASPIRIN EC 81 MG PO TBEC
81.0000 mg | DELAYED_RELEASE_TABLET | Freq: Every day | ORAL | Status: DC
  Administered 2014-04-12 – 2014-04-13 (×2): 81 mg via ORAL
  Filled 2014-04-11 (×2): qty 1

## 2014-04-11 MED ORDER — PRO-STAT SUGAR FREE PO LIQD
30.0000 mL | Freq: Two times a day (BID) | ORAL | Status: DC
  Administered 2014-04-12 – 2014-04-13 (×3): 30 mL via ORAL
  Filled 2014-04-11 (×8): qty 30

## 2014-04-11 NOTE — Consult Note (Signed)
PULMONARY / CRITICAL CARE MEDICINE   Name: Gregory Wiley MRN: [id number] DOB: [birth year]    ADMISSION DATE:  04/11/2014 CONSULTATION DATE:  04/11/14   REFERRING MD :  Family Medicine   CHIEF COMPLAINT:  Submassive PE  INITIAL PRESENTATION: 64 y.o pmh CAD s/p stent and CABG, MI, DM, HTN, HLD, obesity, OSA on cpap, h/o ischemic cardiomyopathy, diverticulosis, tubular adenoma, embolic stroke with left hemiplegia 02/12/14 discharged from medicine service to rehab. He presented to [facility] with chest pain and sob found to have B/l submassive PE. HD stable on room air. CCM consulted for recommendations regarding thrombolytics.    STUDIES:  1/25 CXR>>Low lung volumes without radiographic evidence of acute cardiopulmonary disease.Postoperative changes and support apparatus, as above  1/25 Chest CT Large bilateral pulmonary emboli within the main pulmonary arteries, extending distally into branches to all lobes of both lungs. RV/LV ratio 1.0 corresponds to right heart strain, and concern for submassive pulmonary embolus. Diffuse coronary artery calcifications seen.   SIGNIFICANT EVENTS: 1/25>>admitted with submassive b/l PE   HISTORY OF PRESENT ILLNESS:   64 y.o pmh CAD s/p stent and CABG, MI, DM, HTN, HLD, obesity, OSA on cpap, h/o ischemic cardiomyopathy, diverticulosis, tubular adenoma, embolic stroke with left hemiplegia 02/12/14 discharged from medicine service to rehab. He presented to [facility] with chest pain and sob found to have B/l submassive PE. HD stable on room air. CCM consulted for recommendations regarding thrombolytics.    PAST MEDICAL HISTORY :   has a past medical history of Coronary artery disease; MI (myocardial infarction); Diabetes mellitus; Hypertension; Hyperlipidemia; Obesity (BMI 30-39.9); OSA on CPAP; H/O cardiomyopathy; Diverticulosis; Internal hemorrhoids; Tubular adenoma of colon; Plantar fasciitis of left foot; and Stroke.  has past surgical history that includes Coronary  artery bypass graft (07/20/2010 ); Coronary angioplasty with stent (07/01/2011); Coronary angioplasty with stent (12/01/2007); Hernia repair (2006); Knee surgery (Right); Hand surgery; Colonoscopy (N/A, 02/10/2013); Retinal detachment surgery; Loop recorder implant (02/15/2014); TEE without cardioversion (N/A, 02/15/2014); left heart catheterization with coronary/graft angiogram (07/01/2011); and percutaneous coronary stent intervention (pci-s) (Right, 07/01/2011). Prior to Admission medications   Medication Sig Start Date End Date Taking? Authorizing Provider  acetaminophen (TYLENOL) 325 MG tablet Take 2 tablets (650 mg total) by mouth every 6 (six) hours as needed for mild pain. 03/15/14  Yes Ivan Anchors Love, PA-C  ALPRAZolam (XANAX) 0.25 MG tablet Take 0.5 tablets (0.125 mg total) by mouth every 8 (eight) hours as needed for anxiety. 03/15/14  Yes Ivan Anchors Love, PA-C  Amino Acids-Protein Hydrolys (FEEDING SUPPLEMENT, PRO-STAT SUGAR FREE 64,) LIQD Take 30 mLs by mouth 2 (two) times daily between meals. 03/15/14  Yes Ivan Anchors Love, PA-C  Amino Acids-Protein Hydrolys (FEEDING SUPPLEMENT, PRO-STAT SUGAR FREE 64,) LIQD Take 30 mLs by mouth 2 (two) times daily.   Yes Historical Provider, MD  aspirin EC 81 MG EC tablet Take 1 tablet (81 mg total) by mouth daily. 02/15/14  Yes Drucilla Schmidt, MD  atorvastatin (LIPITOR) 80 MG tablet Take 1 tablet (80 mg total) by mouth daily at 6 PM. 02/15/14  Yes Drucilla Schmidt, MD  clopidogrel (PLAVIX) 75 MG tablet Take 75 mg by mouth daily.   Yes Historical Provider, MD  fluticasone (FLONASE) 50 MCG/ACT nasal spray Place 2 sprays into both nostrils daily. 03/15/14  Yes Ivan Anchors Love, PA-C  meclizine (ANTIVERT) 25 MG tablet Take 1 tablet (25 mg total) by mouth 3 (three) times daily as needed for dizziness. 03/15/14  Yes Ivan Anchors Love, PA-C  Menthol-Methyl Salicylate (  MUSCLE RUB) 10-15 % CREA Apply 1 application topically 2 (two) times daily. To left ankle and left shoulder 03/15/14  Yes  Ivan Anchors Love, PA-C  methylphenidate (RITALIN) 5 MG tablet Take one tablet by mouth twice daily with breakfast and lunch 04/11/14  Yes Lauree Chandler, NP  pantoprazole (PROTONIX) 40 MG tablet Take 1 tablet (40 mg total) by mouth 2 (two) times daily. 03/15/14  Yes Ivan Anchors Love, PA-C  saccharomyces boulardii (FLORASTOR) 250 MG capsule Take 250 mg by mouth 2 (two) times daily. For 10 days. Started on 03-28-14 for 10 days   Yes Historical Provider, MD  sennosides (SENOKOT) 8.8 MG/5ML syrup Take 5 mLs by mouth 2 (two) times daily. 03/15/14  Yes Ivan Anchors Love, PA-C  Vitamin D, Ergocalciferol, (DRISDOL) 50000 UNITS CAPS capsule Take 50,000 Units by mouth every 7 (seven) days. Take on Mondays   Yes Historical Provider, MD  gabapentin (NEURONTIN) 100 MG capsule Take 1 capsule (100 mg total) by mouth 3 (three) times daily. 03/15/14   Bary Leriche, PA-C  nitroGLYCERIN (NITROSTAT) 0.4 MG SL tablet Place 0.4 mg under the tongue every 5 (five) minutes as needed. For chest pain    Historical Provider, MD   Allergies  Allergen Reactions  . Diphenhydramine Hcl Anaphylaxis    FAMILY HISTORY:  indicated that his mother is deceased. He indicated that his father is deceased. He indicated that his sister is alive. He indicated that his brother is alive. He indicated that his maternal grandmother is deceased. He indicated that his paternal grandfather is deceased.  SOCIAL HISTORY:  reports that he has quit smoking. He has never used smokeless tobacco. He reports that he drinks alcohol. He reports that he does not use illicit drugs. 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 unable since 02/12/14 stroke (since L hand dominant and speech affected by stroke)   REVIEW OF SYSTEMS:   General: denies fever, chills HEENT: denies headache  Cardiac: +5/5 L chest pain started at 2 PM ("pressure like something siting on chest")  w/o radiation resolved with NTG and 4 baby Aspirin   Pulm: +sob resolved, denies cough  [id number] ab pain Ext: denies leg pain or swelling  Neuro: +left hemiplegia since stroke 01/2014 (L hand dominant)    SUBJECTIVE:  +chest pain resolved with NTG, + sob resolved   VITAL SIGNS: Temp:  [97.6 F (36.4 C)-98 F (36.7 C)] 98 F (36.7 C) (01/25 2247) Pulse Rate:  [104-116] 107 (01/26 0031) Resp:  [15-22] 20 (01/26 0031) BP: (124-144)/(66-99) 130/92 mmHg (01/25 2200) SpO2:  [88 %-97 %] 95 % (01/26 0031) Weight:  [300 lb 12.8 oz (136.442 kg)] 300 lb 12.8 oz (136.442 kg) (01/25 2247) HEMODYNAMICS:   VENTILATOR SETTINGS:   INTAKE / OUTPUT:  Intake/Output Summary (Last 24 hours) at 04/12/14 0100 Last data filed at 04/11/14 2200  Gross per 24 hour  Intake      0 ml  Output    150 ml  Net   -150 ml    PHYSICAL EXAMINATION: General:  Lying in bed, nad, awake, pleasant, good historian  Neuro:  Left hemiplegia s/p stroke 0/5 left arm 1-2/5 left leg, left facial droop HEENT:  [place]/at  Cardiovascular:  ST, no murmurs  Lungs:  ctab on [place] 2 L sat well  Abdomen:  Soft, ntnd, +bs Musculoskeletal:  Intact  Skin:  Intact   LABS:  CBC  Recent Labs Lab 04/11/14 1904  WBC  10.9*  HGB 14.3  HCT 43.3  PLT 210   Coag's No results for input(s): APTT, INR in the last 168 hours. BMET  Recent Labs Lab 04/11/14 1904  NA 137  K 4.1  CL 105  CO2 21  BUN 11  CREATININE 0.83  GLUCOSE 126*   Electrolytes  Recent Labs Lab 04/11/14 1904  CALCIUM 9.1   Cardiac Enzymes  Recent Labs Lab 04/11/14 1904 04/11/14 2310  TROPONINI 0.17* 0.31*   Glucose No results for input(s): GLUCAP in the last 168 hours.  Imaging Ct Angio Chest Pe W/cm &/or Wo Cm  04/11/2014   CLINICAL DATA:  Acute onset of shortness of breath. Initial encounter.  EXAM: CT ANGIOGRAPHY CHEST WITH CONTRAST  TECHNIQUE: Multidetector CT imaging of the chest was performed using the standard protocol during bolus administration of intravenous contrast. Multiplanar  CT image reconstructions and MIPs were obtained to evaluate the vascular anatomy.  CONTRAST:  146mL OMNIPAQUE IOHEXOL 350 MG/ML SOLN  COMPARISON:  Chest radiograph performed earlier today at 6:17 p.m.  FINDINGS: Large bilateral pulmonary emboli are noted within the main pulmonary arteries, extending distally into branches to all lobes of both lungs. The RV/LV ratio of 1.0 corresponds to right heart strain, and concern for submassive pulmonary embolus.  The lungs are essentially clear bilaterally. There is no evidence of significant focal consolidation, pleural effusion or pneumothorax. No masses are identified; no abnormal focal contrast enhancement is seen.  The mediastinum is otherwise unremarkable. Diffuse coronary artery calcifications are seen. The patient is status post median sternotomy. No mediastinal lymphadenopathy is seen. No pericardial effusion is identified. The great vessels are grossly unremarkable. No axillary lymphadenopathy is seen. The visualized portions of the thyroid gland are unremarkable in appearance.  The visualized portions of the liver and spleen are unremarkable. A small metallic device is noted at the anterior left chest wall.  No acute osseous abnormalities are seen. Anterior bridging osteophytes are noted along the lower thoracic spine, and mild degenerative change is noted at the lower cervical spine.  Review of the MIP images confirms the above findings.  IMPRESSION: 1. Large bilateral pulmonary emboli within the main pulmonary arteries, extending distally into branches to all lobes of both lungs. RV/LV ratio 1.0 corresponds to right heart strain, and concern for submassive pulmonary embolus. 2. Lungs clear bilaterally. 3. Diffuse coronary artery calcifications seen.  Critical Value/emergent results were called by telephone at the time of interpretation on 04/11/2014 at 8:55 pm to Dr. Threasa Beards BELFI, who verbally acknowledged these results.   Electronically Signed   By: Garald Balding M.D.   On: 04/11/2014 21:21   Dg Chest Port 1 View  04/11/2014   CLINICAL DATA:  Central chest pain and pressure with shortness of breath for 1 day.  EXAM: PORTABLE CHEST - 1 VIEW  COMPARISON:  Chest x-ray 03/14/2014.  FINDINGS: Lung volumes are low. No acute consolidative airspace disease. No definite pleural effusions. No evidence of pulmonary edema. Heart size is within normal limits. The patient is rotated to the left on today's exam, resulting in distortion of the mediastinal contours and reduced diagnostic sensitivity and specificity for mediastinal pathology. Status post median sternotomy for CABG. Implantable [employer] loop recorder projecting over the lower left hemithorax.  IMPRESSION: 1. Low lung volumes without radiographic evidence of acute cardiopulmonary disease. 2. Postoperative changes and support apparatus, as above.   Electronically Signed   By: Vinnie Langton M.D.   On: 04/11/2014 19:00     ASSESSMENT /  PLAN: PULMONARY OETT none  A: Bilateral submassive PE (Moderate risk Geneva score) OSA on cpap   P:   -Continue Heparin per pharm  -cpap qhs  -O2 prn for 02 sats >90%  -Patient is HD stable on [place] 2L oxygenating well currently. -Per up to date indications for thrombolytics include HD instability, hypotension/shock sbp <90 or sbp drop > or = 40 mmHg from baseline. If patient is HD stable the benefit of thrombolytics is on a case by case basis I.e RV dysfunction, cardiopulmonary resuscitation, extensive clot burden, severe hypoxemia, free floating right atrial/ventricular thrombus, patent foramen ovale.  Also the patient has had a right MCA embolic stroke 93/81/01 in which thrombolytics are contraindicated.  Per up to date nonhemorrhagic strokes within the prior > or = 3 months are a relative contraindication and ischemic stroke within 3 months is a absolute contraindication.    -For these reasons would NOT recommend giving thrombolytics at time time would recommend  doppler lower extremities b/l and 2D echo to look for right heart strain and evaluate for PFO. If positive for right heart strain or PFO then consider risks vs benefits of thrombolytics  -Consider hypercoagulable panel i.e factor V Leiden, PT gene mutation, homocysteine  -please contact CCM if needed for further questions. Appreciate consult for this pleasant patient.   CARDIOVASCULAR CVL none A:  Elevated troponin likely demand  H/o CAD, NSTEMI s/p PCI x 3, CABG x  4 H/o ischemic cardiomyopathy  H/o HTN HLD P:  Lipitor 80 mg, Aspirin 81 mg qd, Plavix 75 mg qd  Trend trop x 3, prn NTG  rec obtain echo   RENAL A:   No acute issues  P:   Trend bmet, daily wts, i/o   GASTROINTESTINAL A:   NPO for now  H/oSmall hiatal hernia, erosive gastritis/duodentis, stricture at GE junction, Gastric antrum ulcer (endoscopy 01/2014) Colonoscopy 01/2013 with h/o multiple sessile polpys (tubular adenoma and hyperplastic polyp no high grade dysplasia) , small internal hemorrhoids, mild diverticulosis  P:   Protonix 40 mg bid. Will need long term PPI. Also f/u with GI in 3 months from 02/02/14 for repeat endoscopy to make sure ulcer healing  Will likely need to resume dysphagia 3 diet when able   HEMATOLOGIC A:   No active issues  P:  Trend CBC, rec send hypercoagulable when able (off anticoagulants ideally)  INFECTIOUS A:   Mild leukocytosis likely reactive  P:   BCx2  none UC none Sputum none Abx: none   No active signs of infection ,trend CBC  ENDOCRINE A:   H/o DM 2 (HA1C 5.6 02/13/14)  P:   Monitor cbgs, add SSI if hyperglycemic   NEUROLOGIC A:   Pain  Anxiety  Dizziness  H/o embolic stroke right middle cerebral artery with left hemiplegia 01/2014   P:   Prn Tylenol, Gabapentin 100 mg tid. Consider  Xanax 0.125 mg tid prn  Prn Meclizine  Aspirin 81 mg qd, plavix 75 mg qd  Would consider getting PT/OT to work with the patient when ready to help with strength this  admission since was getting aggressive rehab at outside facility prior to admission    FAMILY  - Updates: primary to update family  - Inter-disciplinary family meet or Palliative Care meeting due by:  04/18/14     TODAY'S SUMMARY: 64 y.o presented with chest pain and sob found to have submassive b/l PE on Heparin.  PCCM consulted to give further recommendations on thrombolytics which would NOT recommend  at this time as patient is HD stable on room air to 2L [place] saturating well        Pulmonary and [place] Pager: [phone]  04/12/2014, 1:00 AM  Karlyn Agee MD   PCCM ATTENDING: I have reviewed pt's initial presentation, consultants notes and hospital database in detail.  The above assessment and plan was formulated under my direction.  In summary: Acute PE Recent large R hemispheric CVA Recent UGIB  He is definitely not a candidate for thrombolytic therapy, nor is it indicated as he has been hemodynamically stable.  His RF is recent hospitalization and limited mobility Given the extent of his neurological impairment, it is likely that his major RF will persist indefinitely For that reason, I recommend lifelong anticoagulation with either a NOAC such as rivaroxaban or warfarin  This therapy may be initiated today Since he has had a recent major UGIB, I recommend that he remain on medical therapy with PPI or H2RA as long as he is on full anticoagulation  PCCM will sign off. Please call if we can be of further assistance  Merton Border, MD;  PCCM service; [place] [phone]

## 2014-04-11 NOTE — Progress Notes (Signed)
ANTICOAGULATION CONSULT NOTE - Initial Consult  Pharmacy Consult for Heparin Indication: Pulmonary Embolism  Allergies  Allergen Reactions  . Diphenhydramine Hcl Anaphylaxis    Patient Measurements:   Heparin Dosing Weight: 113.23 kg Actual body weight 144.1 kg IBW: 80 kg  Vital Signs: Temp: 97.6 F (36.4 C) (01/25 1758) Temp Source: Oral (01/25 1758) BP: 128/98 mmHg (01/25 1945) Pulse Rate: 109 (01/25 1945)  Labs:  Recent Labs  04/11/14 1904  HGB 14.3  HCT 43.3  PLT 210  CREATININE 0.83  TROPONINI 0.17*    CrCl cannot be calculated (Unknown ideal weight.).   Medical History: Past Medical History  Diagnosis Date  . Coronary artery disease     stent, then CABG 07/20/10  . MI (myocardial infarction)     NSTEMI- last cath 06/2011-Stent to LCX-DES, last nuc 07/30/11 low risk  . Diabetes mellitus     hx of, diet controlled  . Hypertension   . Hyperlipidemia   . Obesity (BMI 30-39.9)   . OSA on CPAP     since July 2013  . H/O cardiomyopathy     ischemic, now last echo 07/30/11, EF 38%W  . Diverticulosis   . Internal hemorrhoids   . Tubular adenoma of colon   . Plantar fasciitis of left foot   . Stroke     Medications:   (Not in a hospital admission) Scheduled:  Infusions:   Assessment:  32 YOM w/ PMH of CAD, NSTEMI s/p LCX-DES '13, DM, DM, HTN, HLD, obesity, OSA, and stroke presenting [facility] c/o CP.  CT chest revealed large bilateral pulmonary emboli within the main pulmonary arteries, extending distally into branche sto all lobes of both lungs.  Pharmacy has been consulted to dose heparin in the setting of pulmonary embolism.    Trop up 0.19, D-dimer positive  H/H wnl, plt wnl  Patient has been on plavix since his stroke.  No PTA anticoagulants.    Goal of Therapy:  Heparin level 0.3-0.7 units/ml Monitor platelets by anticoagulation protocol: Yes   Plan:  - Heparin 5000 unit bolus x 1 - Heparin 1950 unit/hr IV infusion - Heparin  level 6 hours after start of infusion - Daily CBC and heparin level - Monitor for signs and symptoms of bleeding  Hassie Bruce, Pharm. D. Clinical Pharmacy Resident Pager: [phone] Ph: [phone] 04/11/2014 9:34 PM

## 2014-04-11 NOTE — ED Notes (Signed)
Attempted report 

## 2014-04-11 NOTE — H&P (Signed)
[facility] Admission History and Physical Service Pager: [phone]  Patient name: Gregory Wiley Medical record number: [id number] Date of birth: 04/20/1950 Age: 64 y.o. Gender: male  Primary Care Provider: Tamsen Roers, MD Consultants: CCM Code Status: DNR (confirmed on admission)  Chief Complaint: Chest pain and dyspnea  Assessment and Plan: [name] is a 64 y.o. male presenting with chest pain and dyspnea. 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  #Bilateral submassive PEs: Patient with no prior thrombosis or family history of thrombophilia. Initial Wells score was 4.5 placing patient in moderate risk group. Patient has had no recent trauma or surgery, stasis seems to be the only risk factor s/p CVA. CTA with large bilateral pulmonary emboli with associated right heart strain. D-dimer 6.85. Trop 0.17.  Patient hemodynamically stable. -Admit to stepdown unit under Dr. Ree Kida  -Will trend troponins -Continue heparin at therapeutic levels per pharmacy -AM labs to include CBC,CMP, Mg, PT/PTT/INR -CCM consult; appreciate recs - they will see patient and talk to IR about if patient is candidate for targeted thrombolysis -Oxygen as needed to keep oxygen saturation above 92% -nitroglycerin prn for chest pain -obtain LE dopplers and echo  #HLD: last lipid panel 01/2014. LDL 120 at that time. - Continue home Lipitor  #HTN: Blood pressures currently normotensive. -Will monitor -Currently controlled  #Recent CVA: Admitted by neurology 02/12/2014 for CVA. Patient now left sided hemiplegia from CVA. Patient was receiving outpatient rehabilitation in SNF. -will order PT/OT -monitor neuro status -continue gabapentin -continue home aspirin, hold home plavix, will likely need conversation with neuro and cards regarding plavix/aspirin/anticoagulation prior to discharge  #CAD: Patient with  extensive cardiac history. S/p multiple MIs and CABG x4. Will be at risk for demand ischemia with significant cardiac history. -will continue aspirin -follow-up AM EKG  #OSA: -CPAP ordered qhs  FEN/GI: Heart healthy diet\SLIV --> NPO at midnight pending possible IR procedure Prophylaxis: therapeutic Heparin  Disposition: Admit to stepdown unit  History of Present Illness: Gregory Wiley is a 64 y.o. male presenting with dyspnea and chest pain. PMH is significant for CAD s/p MI x4 and CABG 4, HTN, HLD, OSA, CHF, CVA with associated left-sided neglect (hospitalized for this on 02/12/14 and underwent rehab following this). Patient presenting with chest pain that started after lunch and has lasted several hours. He states that it feels like something is sitting on his chest. He states that the chest heaviness has gotten progressively worse. Chest pain is also causing him to have trouble breathing. Patient states he is unable to catch his breath. The pain is non-radiating. He states this does not feel like his previous heart attacks. Patient states that he has never had any issues with blood clots. He denies any leg swelling or pain. Patient states that he currently stays in a rehabilitation facility where he does PT daily. Denies any recent surgeries. Patient endorses feeling warm and clammy. Patient was on Plavix and aspirin daily prior to admission.   In ED, patient was tachycardic and hypoxic. He was started on 2 L of oxygen [place]. CTA was ordered for concern of PE. D-dimer noted to be elevated prior to CTA. CTA revealed bilateral PE with right heart strain.   Review Of Systems: Per HPI. Otherwise 12 point review of systems was performed and was unremarkable.  Patient Active Problem List   Diagnosis Date Noted  . Pulmonary emboli 04/11/2014  . Left-sided  neglect 03/09/2014  . Dysphagia, pharyngoesophageal phase 03/02/2014  . Arthralgia of left ankle 02/24/2014  . Hemiplegia affecting left  dominant side 02/16/2014  . 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 Benign neoplasm of colon 02/10/2013  . Special screening for malignant neoplasms, colon 02/10/2013  . OSA on CPAP 01/06/2013  . S/P angioplasty with DES to CFX 07/01/11 07/02/2011  . Sleep apnea, "cant afford C-pap" 07/02/2011  . CAD, RCA PCI '09, 10/11, CABG X 4 5/12 06/30/2011  . Hypertension, B/P has been low this admission 06/30/2011  . Hyperlipidemia 06/30/2011  . Morbid obesity 06/30/2011  . NSTEMI, 06/29/11 06/30/2011  . 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 Ischemic cardiomyopathy, EF 35-40 2D May 2012 06/30/2011  . CHF, acute, mild 06/30/2011   Past Medical History: Past Medical History  Diagnosis Date  . Coronary artery disease     stent, then CABG 07/20/10  . MI (myocardial infarction)     NSTEMI- last cath 06/2011-Stent to LCX-DES, last nuc 07/30/11 low risk  . Diabetes mellitus     hx of, diet controlled  . Hypertension   . Hyperlipidemia   . Obesity (BMI 30-39.9)   . OSA on CPAP     since July 2013  . H/O cardiomyopathy     ischemic, now last echo 07/30/11, EF 38%W  . Diverticulosis   . Internal hemorrhoids   . Tubular adenoma of colon   . Plantar fasciitis of left foot   . Stroke    Past Surgical History: Past Surgical History  Procedure Laterality Date  . Coronary artery bypass graft  07/20/2010     LIMA-LAD; VG-ACUTE MARG of RCA; Seq VG-distal RCA & then pda  . Coronary angioplasty with stent placement  07/01/2011    DES-Resolute to native LCX  . Coronary angioplasty with stent placement  12/01/2007    COMPLEX 5 LESION PCI INCLUDING CUTTING BALLOON AND 4 CYPHER DESs  . Hernia repair  2006  . Knee surgery Right   . Hand surgery      to take glass out  . Colonoscopy N/A 02/10/2013    Procedure: COLONOSCOPY;  Surgeon: Ladene Artist, MD;  Location: [facility] ENDOSCOPY;  Service: Endoscopy;  Laterality: N/A;  . Retinal detachment surgery    . Loop  recorder implant  02/15/2014    MDT LINQ implanted by Dr Rayann Heman for cryptogenic stroke  . Tee without cardioversion N/A 02/15/2014    Procedure: TRANSESOPHAGEAL ECHOCARDIOGRAM (TEE);  Surgeon: Josue Hector, MD;  Location: [facility] ENDOSCOPY;  Service: Cardiovascular;  Laterality: N/A;  . Left heart catheterization with coronary/graft angiogram  07/01/2011    Procedure: LEFT HEART CATHETERIZATION WITH Beatrix Fetters;  Surgeon: Sanda Klein, MD;  Location: [facility] CATH LAB;  Service: Cardiovascular;;  . Percutaneous coronary stent intervention (pci-s) Right 07/01/2011    Procedure: PERCUTANEOUS CORONARY STENT INTERVENTION (PCI-S);  Surgeon: Sanda Klein, MD;  Location: [facility] CATH LAB;  Service: Cardiovascular;  Laterality: Right;   Social History: History  Substance Use Topics  . Smoking status: Former Research scientist (life sciences)  . Smokeless tobacco: Never Used  . Alcohol Use: Yes     Comment: 1-2 times per year   Additional social history: Currently staying at [facility].  Please also refer to relevant sections of EMR.  Family History: Family History  Problem Relation Age of Onset  . Diabetes type II Mother   . Coronary artery disease    . Colon cancer Neg Hx   . CAD Father 9  CABG, PCI, died at 2  . Heart attack Father 47  . Hypertension Brother   . Diabetes Brother   . Hyperlipidemia Brother   . Brain cancer Maternal Grandmother   . COPD Paternal Grandfather    Allergies and Medications: Allergies  Allergen Reactions  . Diphenhydramine Hcl Anaphylaxis   No current facility-administered medications on file prior to encounter.   Current Outpatient Prescriptions on File Prior to Encounter  Medication Sig Dispense Refill  . acetaminophen (TYLENOL) 325 MG tablet Take 2 tablets (650 mg total) by mouth every 6 (six) hours as needed for mild pain.    Marland Kitchen ALPRAZolam (XANAX) 0.25 MG tablet Take 0.5 tablets (0.125 mg total) by mouth every 8 (eight) hours as needed for anxiety. 5 tablet  0  . Amino Acids-Protein Hydrolys (FEEDING SUPPLEMENT, PRO-STAT SUGAR FREE 64,) LIQD Take 30 mLs by mouth 2 (two) times daily between meals. 900 mL 0  . aspirin EC 81 MG EC tablet Take 1 tablet (81 mg total) by mouth daily.    Marland Kitchen atorvastatin (LIPITOR) 80 MG tablet Take 1 tablet (80 mg total) by mouth daily at 6 PM.    . clopidogrel (PLAVIX) 75 MG tablet Take 75 mg by mouth daily.    . clopidogrel (PLAVIX) 75 MG tablet Take 1 tablet (75 mg total) by mouth daily.    . fluticasone (FLONASE) 50 MCG/ACT nasal spray Place 2 sprays into both nostrils daily.  2  . gabapentin (NEURONTIN) 100 MG capsule Take 1 capsule (100 mg total) by mouth 3 (three) times daily.    . meclizine (ANTIVERT) 25 MG tablet Take 1 tablet (25 mg total) by mouth 3 (three) times daily as needed for dizziness. 30 tablet 0  . Menthol-Methyl Salicylate (MUSCLE RUB) 10-15 % CREA Apply 1 application topically 2 (two) times daily. To left ankle and left shoulder  0  . methylphenidate (RITALIN) 5 MG tablet Take one tablet by mouth twice daily with breakfast and lunch 60 tablet 0  . nitroGLYCERIN (NITROSTAT) 0.4 MG SL tablet Place 0.4 mg under the tongue every 5 (five) minutes as needed. For chest pain    . pantoprazole (PROTONIX) 40 MG tablet Take 1 tablet (40 mg total) by mouth 2 (two) times daily.    . sennosides (SENOKOT) 8.8 MG/5ML syrup Take 5 mLs by mouth 2 (two) times daily. 240 mL 0  . Vitamin D, Ergocalciferol, (DRISDOL) 50000 UNITS CAPS capsule Take 50,000 Units by mouth every 7 (seven) days. Take on Mondays      Objective: BP 128/98 mmHg  Pulse 109  Temp(Src) 97.6 F (36.4 C) (Oral)  Resp 18  SpO2 92% Exam: General: alert, well-developed, NAD, cooperative HEENT: NCAT, MMM, vision grossly intact, PERRLA, Oral mucosa and oropharynx reveal no lesions or exudates. [place] in place Lungs: CTAB, normal respiratory effort, no crackles, and no wheezes. Diminished breath sounds at lung bases bilaterally.   Heart: Tachycardic,  regular rhythm, normal heart sounds Abdomen: Bowel sounds normal; abdomen soft and nontender. No guarding, no rebound tenderness Msk: no joint swelling, no joint warmth, and no redness over joints. No calf swelling or tenderness or cords. Extremities: No cyanosis or clubbing. trace edema Neurologic: A&Ox3, CN grossly intact, LUE and LLE strength 1/5 and 5/5 RUE and RLE strength, sensation to light touch intact bilateral UE and LE Skin: Intact without suspicious lesions or rashes. Warm and dry. Psych: Mood and affect are normal; no evidence of anxiety or depression.   Labs and Imaging: Results for orders placed  or performed during the hospital encounter of 04/11/14 (from the past 24 hour(s))  CBC     Status: Abnormal   Collection Time: 04/11/14  7:04 PM  Result Value Ref Range   WBC 10.9 (H) 4.0 - 10.5 K/uL   RBC 5.26 4.22 - 5.81 MIL/uL   Hemoglobin 14.3 13.0 - 17.0 g/dL   HCT 43.3 39.0 - 52.0 %   MCV 82.3 78.0 - 100.0 fL   MCH 27.2 26.0 - 34.0 pg   MCHC 33.0 30.0 - 36.0 g/dL   RDW 14.6 11.5 - 15.5 %   Platelets 210 150 - 400 K/uL  Basic metabolic panel     Status: Abnormal   Collection Time: 04/11/14  7:04 PM  Result Value Ref Range   Sodium 137 135 - 145 mmol/L   Potassium 4.1 3.5 - 5.1 mmol/L   Chloride 105 96 - 112 mmol/L   CO2 21 19 - 32 mmol/L   Glucose, Bld 126 (H) 70 - 99 mg/dL   BUN 11 6 - 23 mg/dL   Creatinine, Ser 0.83 0.50 - 1.35 mg/dL   Calcium 9.1 8.4 - 10.5 mg/dL   GFR calc non Af Amer >90 >90 mL/min   GFR calc Af Amer >90 >90 mL/min   Anion gap 11 5 - 15  Troponin I (MHP)     Status: Abnormal   Collection Time: 04/11/14  7:04 PM  Result Value Ref Range   Troponin I 0.17 (H) <0.031 ng/mL  D-dimer, quantitative     Status: Abnormal   Collection Time: 04/11/14  7:04 PM  Result Value Ref Range   D-Dimer, Quant 6.85 (H) 0.00 - 0.48 ug/mL-FEU  I-stat troponin, ED (not at [facility])     Status: Abnormal   Collection Time: 04/11/14  7:12 PM  Result Value Ref Range    Troponin i, poc 0.19 (HH) 0.00 - 0.08 ng/mL   Comment NOTIFIED PHYSICIAN    Comment 3           Ct Angio Chest Pe W/cm &/or Wo Cm  04/11/2014   CLINICAL DATA:  Acute onset of shortness of breath. Initial encounter.  EXAM: CT ANGIOGRAPHY CHEST WITH CONTRAST  TECHNIQUE: Multidetector CT imaging of the chest was performed using the standard protocol during bolus administration of intravenous contrast. Multiplanar CT image reconstructions and MIPs were obtained to evaluate the vascular anatomy.  CONTRAST:  135mL OMNIPAQUE IOHEXOL 350 MG/ML SOLN  COMPARISON:  Chest radiograph performed earlier today at 6:17 p.m.  FINDINGS: Large bilateral pulmonary emboli are noted within the main pulmonary arteries, extending distally into branches to all lobes of both lungs. The RV/LV ratio of 1.0 corresponds to right heart strain, and concern for submassive pulmonary embolus.  The lungs are essentially clear bilaterally. There is no evidence of significant focal consolidation, pleural effusion or pneumothorax. No masses are identified; no abnormal focal contrast enhancement is seen.  The mediastinum is otherwise unremarkable. Diffuse coronary artery calcifications are seen. The patient is status post median sternotomy. No mediastinal lymphadenopathy is seen. No pericardial effusion is identified. The great vessels are grossly unremarkable. No axillary lymphadenopathy is seen. The visualized portions of the thyroid gland are unremarkable in appearance.  The visualized portions of the liver and spleen are unremarkable. A small metallic device is noted at the anterior left chest wall.  No acute osseous abnormalities are seen. Anterior bridging osteophytes are noted along the lower thoracic spine, and mild degenerative change is noted at the lower cervical spine.  Review of the MIP images confirms the above findings.  IMPRESSION: 1. Large bilateral pulmonary emboli within the main pulmonary arteries, extending distally into branches  to all lobes of both lungs. RV/LV ratio 1.0 corresponds to right heart strain, and concern for submassive pulmonary embolus. 2. Lungs clear bilaterally. 3. Diffuse coronary artery calcifications seen.  Critical Value/emergent results were called by telephone at the time of interpretation on 04/11/2014 at 8:55 pm to Dr. Threasa Beards BELFI, who verbally acknowledged these results.   Electronically Signed   By: Garald Balding M.D.   On: 04/11/2014 21:21   Dg Chest Port 1 View  04/11/2014   CLINICAL DATA:  Central chest pain and pressure with shortness of breath for 1 day.  EXAM: PORTABLE CHEST - 1 VIEW  COMPARISON:  Chest x-ray 03/14/2014.  FINDINGS: Lung volumes are low. No acute consolidative airspace disease. No definite pleural effusions. No evidence of pulmonary edema. Heart size is within normal limits. The patient is rotated to the left on today's exam, resulting in distortion of the mediastinal contours and reduced diagnostic sensitivity and specificity for mediastinal pathology. Status post median sternotomy for CABG. Implantable [employer] loop recorder projecting over the lower left hemithorax.  IMPRESSION: 1. Low lung volumes without radiographic evidence of acute cardiopulmonary disease. 2. Postoperative changes and support apparatus, as above.   Electronically Signed   By: Vinnie Langton M.D.   On: 04/11/2014 19:00    Katheren Shams, DO 04/11/2014, 9:31 PM PGY-1, [place] Intern pager: [phone], text pages welcome Upper Level Addendum:  I have seen and evaluated this patient along with Dr. Gerarda Fraction and reviewed the above note, making necessary revisions in red.   Tommi Rumps, MD Family Medicine PGY-2

## 2014-04-11 NOTE — Telephone Encounter (Signed)
[facility] 

## 2014-04-11 NOTE — ED Notes (Signed)
Pt placed on 2 L o2.

## 2014-04-11 NOTE — ED Notes (Signed)
Per Crystal, AC, patient ok to go to [facility]

## 2014-04-11 NOTE — ED Provider Notes (Signed)
CSN: [id number]     Arrival date & time 04/11/14  1749 History   First MD Initiated Contact with Patient 04/11/14 1755     Chief Complaint  Patient presents with  . Chest Pain     (Consider location/radiation/quality/duration/timing/severity/associated sxs/prior Treatment) HPI Comments: Patient presents with chest pain. He was admitted to the hospital on November 28 for a stroke and has been at [facility] since that time. He has a history of four-vessel bypass surgery in 2012. He states he started having chest pain to left side of his chest which she describes as a pressure about 12:00 this afternoon and it's been constant since that time. He describes some associated shortness of breath. There is no pleuritic type chest pain. There is no cough or recent fevers. He denies any leg pain or swelling. 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 He is on Plavix since the stroke. Dr. Claiborne Billings is his cardiologist.  Patient is a 64 y.o. male presenting with chest pain.  Chest Pain Associated symptoms: fatigue and shortness of breath   Associated symptoms: no abdominal pain, no back pain, no cough, no diaphoresis, no dizziness, no fever, no headache, no nausea, no numbness, not vomiting and no weakness     Past Medical History  Diagnosis Date  . Coronary artery disease     stent, then CABG 07/20/10  . MI (myocardial infarction)     NSTEMI- last cath 06/2011-Stent to LCX-DES, last nuc 07/30/11 low risk  . Diabetes mellitus     hx of, diet controlled  . Hypertension   . Hyperlipidemia   . Obesity (BMI 30-39.9)   . OSA on CPAP     since July 2013  . H/O cardiomyopathy     ischemic, now last echo 07/30/11, EF 38%W  . Diverticulosis   . Internal hemorrhoids   . Tubular adenoma of colon   . Plantar fasciitis of left foot   . Stroke    Past Surgical History  Procedure Laterality Date  . Coronary  artery bypass graft  07/20/2010     LIMA-LAD; VG-ACUTE MARG of RCA; Seq VG-distal RCA & then pda  . Coronary angioplasty with stent placement  07/01/2011    DES-Resolute to native LCX  . Coronary angioplasty with stent placement  12/01/2007    COMPLEX 5 LESION PCI INCLUDING CUTTING BALLOON AND 4 CYPHER DESs  . Hernia repair  2006  . Knee surgery Right   . Hand surgery      to take glass out  . Colonoscopy N/A 02/10/2013    Procedure: COLONOSCOPY;  Surgeon: Ladene Artist, MD;  Location: [facility] ENDOSCOPY;  Service: Endoscopy;  Laterality: N/A;  . Retinal detachment surgery    . Loop recorder implant  02/15/2014    MDT LINQ implanted by Dr Rayann Heman for cryptogenic stroke  . Tee without cardioversion N/A 02/15/2014    Procedure: TRANSESOPHAGEAL ECHOCARDIOGRAM (TEE);  Surgeon: Josue Hector, MD;  Location: [facility] ENDOSCOPY;  Service: Cardiovascular;  Laterality: N/A;  . Left heart catheterization with coronary/graft angiogram  07/01/2011    Procedure: LEFT HEART CATHETERIZATION WITH Beatrix Fetters;  Surgeon: Sanda Klein, MD;  Location: [facility] CATH LAB;  Service: Cardiovascular;;  . Percutaneous coronary stent intervention (pci-s) Right 07/01/2011    Procedure: PERCUTANEOUS CORONARY STENT INTERVENTION (PCI-S);  Surgeon: Sanda Klein, MD;  Location: [facility] CATH LAB;  Service: Cardiovascular;  Laterality: Right;  Family History  Problem Relation Age of Onset  . Diabetes type II Mother   . Coronary artery disease    . Colon cancer Neg Hx   . CAD Father 57    CABG, PCI, died at 59  . Heart attack Father 30  . Hypertension Brother   . Diabetes Brother   . Hyperlipidemia Brother   . Brain cancer Maternal Grandmother   . COPD Paternal Grandfather    History  Substance Use Topics  . Smoking status: Former Research scientist (life sciences)  . Smokeless tobacco: Never Used  . Alcohol Use: Yes     Comment: 1-2 times per year    Review of Systems  Constitutional: Positive for fatigue. Negative for fever, chills and  diaphoresis.  HENT: Negative for congestion, rhinorrhea and sneezing.   Eyes: Negative.   Respiratory: Positive for shortness of breath. Negative for cough and chest tightness.   Cardiovascular: Positive for chest pain. Negative for leg swelling.  Gastrointestinal: Negative for nausea, vomiting, abdominal pain, diarrhea and blood in stool.  Genitourinary: Negative for frequency, hematuria, flank pain and difficulty urinating.  Musculoskeletal: Negative for back pain and arthralgias.  Skin: Negative for rash.  Neurological: Negative for dizziness, speech difficulty, weakness, numbness and headaches.      Allergies  Diphenhydramine hcl  Home Medications   Prior to Admission medications   Medication Sig Start Date End Date Taking? Authorizing Provider  acetaminophen (TYLENOL) 325 MG tablet Take 2 tablets (650 mg total) by mouth every 6 (six) hours as needed for mild pain. 03/15/14   Bary Leriche, PA-C  ALPRAZolam Duanne Moron) 0.25 MG tablet Take 0.5 tablets (0.125 mg total) by mouth every 8 (eight) hours as needed for anxiety. 03/15/14   Ivan Anchors Love, PA-C  Amino Acids-Protein Hydrolys (FEEDING SUPPLEMENT, PRO-STAT SUGAR FREE 64,) LIQD Take 30 mLs by mouth 2 (two) times daily between meals. 03/15/14   Bary Leriche, PA-C  aspirin EC 81 MG EC tablet Take 1 tablet (81 mg total) by mouth daily. 02/15/14   Drucilla Schmidt, MD  atorvastatin (LIPITOR) 80 MG tablet Take 1 tablet (80 mg total) by mouth daily at 6 PM. 02/15/14   Drucilla Schmidt, MD  clopidogrel (PLAVIX) 75 MG tablet Take 75 mg by mouth daily.    Historical Provider, MD  clopidogrel (PLAVIX) 75 MG tablet Take 1 tablet (75 mg total) by mouth daily. 02/15/14   Drucilla Schmidt, MD  fluticasone (FLONASE) 50 MCG/ACT nasal spray Place 2 sprays into both nostrils daily. 03/15/14   Bary Leriche, PA-C  gabapentin (NEURONTIN) 100 MG capsule Take 1 capsule (100 mg total) by mouth 3 (three) times daily. 03/15/14   Bary Leriche, PA-C  meclizine (ANTIVERT)  25 MG tablet Take 1 tablet (25 mg total) by mouth 3 (three) times daily as needed for dizziness. 03/15/14   Bary Leriche, PA-C  Menthol-Methyl Salicylate (MUSCLE RUB) 10-15 % CREA Apply 1 application topically 2 (two) times daily. To left ankle and left shoulder 03/15/14   Ivan Anchors Love, PA-C  methylphenidate (RITALIN) 5 MG tablet Take one tablet by mouth twice daily with breakfast and lunch 04/11/14   Lauree Chandler, NP  nitroGLYCERIN (NITROSTAT) 0.4 MG SL tablet Place 0.4 mg under the tongue every 5 (five) minutes as needed. For chest pain    Historical Provider, MD  pantoprazole (PROTONIX) 40 MG tablet Take 1 tablet (40 mg total) by mouth 2 (two) times daily. 03/15/14   Bary Leriche, PA-C  sennosides (SENOKOT) 8.8  MG/5ML syrup Take 5 mLs by mouth 2 (two) times daily. 03/15/14   Bary Leriche, PA-C  Vitamin D, Ergocalciferol, (DRISDOL) 50000 UNITS CAPS capsule Take 50,000 Units by mouth every 7 (seven) days. Take on Mondays    Historical Provider, MD   BP 128/98 mmHg  Pulse 109  Temp(Src) 97.6 F (36.4 C) (Oral)  Resp 18  SpO2 92% Physical Exam  Constitutional: He is oriented to person, place, and time. He appears well-developed and well-nourished.  HENT:  Head: Normocephalic and atraumatic.  Eyes: Pupils are equal, round, and reactive to light.  Neck: Normal range of motion. Neck supple.  Cardiovascular: Regular rhythm and normal heart sounds.  Tachycardia present.   Pulmonary/Chest: Effort normal and breath sounds normal. No respiratory distress. He has no wheezes. He has no rales. He exhibits no tenderness.  Abdominal: Soft. Bowel sounds are normal. There is no tenderness. There is no rebound and no guarding.  Musculoskeletal: Normal range of motion. He exhibits no edema.  No calf tenderness  Lymphadenopathy:    He has no cervical adenopathy.  Neurological: He is alert and oriented to person, place, and time.  Left-sided weakness which is chronic per patient since his stroke   Skin: Skin is warm and dry. No rash noted.  Psychiatric: He has a normal mood and affect.    ED Course  Procedures (including critical care time) Labs Review Labs Reviewed  CBC - Abnormal; Notable for the following:    WBC 10.9 (*)    All other components within normal limits  BASIC METABOLIC PANEL - Abnormal; Notable for the following:    Glucose, Bld 126 (*)    All other components within normal limits  TROPONIN I - Abnormal; Notable for the following:    Troponin I 0.17 (*)    All other components within normal limits  D-DIMER, QUANTITATIVE - Abnormal; Notable for the following:    D-Dimer, Quant 6.85 (*)    All other components within normal limits  I-STAT TROPOININ, ED - Abnormal; Notable for the following:    Troponin i, poc 0.19 (*)    All other components within normal limits    Imaging Review Ct Angio Chest Pe W/cm &/or Wo Cm  04/11/2014   CLINICAL DATA:  Acute onset of shortness of breath. Initial encounter.  EXAM: CT ANGIOGRAPHY CHEST WITH CONTRAST  TECHNIQUE: Multidetector CT imaging of the chest was performed using the standard protocol during bolus administration of intravenous contrast. Multiplanar CT image reconstructions and MIPs were obtained to evaluate the vascular anatomy.  CONTRAST:  128mL OMNIPAQUE IOHEXOL 350 MG/ML SOLN  COMPARISON:  Chest radiograph performed earlier today at 6:17 p.m.  FINDINGS: Large bilateral pulmonary emboli are noted within the main pulmonary arteries, extending distally into branches to all lobes of both lungs. The RV/LV ratio of 1.0 corresponds to right heart strain, and concern for submassive pulmonary embolus.  The lungs are essentially clear bilaterally. There is no evidence of significant focal consolidation, pleural effusion or pneumothorax. No masses are identified; no abnormal focal contrast enhancement is seen.  The mediastinum is otherwise unremarkable. Diffuse coronary artery calcifications are seen. The patient is status post  median sternotomy. No mediastinal lymphadenopathy is seen. No pericardial effusion is identified. The great vessels are grossly unremarkable. No axillary lymphadenopathy is seen. The visualized portions of the thyroid gland are unremarkable in appearance.  The visualized portions of the liver and spleen are unremarkable. A small metallic device is noted at the anterior left chest  wall.  No acute osseous abnormalities are seen. Anterior bridging osteophytes are noted along the lower thoracic spine, and mild degenerative change is noted at the lower cervical spine.  Review of the MIP images confirms the above findings.  IMPRESSION: 1. Large bilateral pulmonary emboli within the main pulmonary arteries, extending distally into branches to all lobes of both lungs. RV/LV ratio 1.0 corresponds to right heart strain, and concern for submassive pulmonary embolus. 2. Lungs clear bilaterally. 3. Diffuse coronary artery calcifications seen.  Critical Value/emergent results were called by telephone at the time of interpretation on 04/11/2014 at 8:55 pm to Dr. Threasa Beards Schae Cando, who verbally acknowledged these results.   Electronically Signed   By: Garald Balding M.D.   On: 04/11/2014 21:21   Dg Chest Port 1 View  04/11/2014   CLINICAL DATA:  Central chest pain and pressure with shortness of breath for 1 day.  EXAM: PORTABLE CHEST - 1 VIEW  COMPARISON:  Chest x-ray 03/14/2014.  FINDINGS: Lung volumes are low. No acute consolidative airspace disease. No definite pleural effusions. No evidence of pulmonary edema. Heart size is within normal limits. The patient is rotated to the left on today's exam, resulting in distortion of the mediastinal contours and reduced diagnostic sensitivity and specificity for mediastinal pathology. Status post median sternotomy for CABG. Implantable [employer] loop recorder projecting over the lower left hemithorax.  IMPRESSION: 1. Low lung volumes without radiographic evidence of acute cardiopulmonary  disease. 2. Postoperative changes and support apparatus, as above.   Electronically Signed   By: Vinnie Langton M.D.   On: 04/11/2014 19:00     EKG Interpretation   Date/Time:  Monday April 11 2014 17:57:03 EST Ventricular Rate:  110 PR Interval:  148 QRS Duration: 108 QT Interval:  350 QTC Calculation: 473 R Axis:   -41 Text Interpretation:  Sinus tachycardia Inferior infarct, old Anteroseptal  infarct, age indeterminate since last tracing no significant change HR has  increased Confirmed by Ona Rathert  MD, Chey Rachels ([id number]) on 04/11/2014 6:07:45 PM      MDM   Final diagnoses:  Pulmonary emboli    Patient with bilateral large PEs. With evidence of right heart strain. His blood pressure is stable. He is oxygen requiring as his sats drop into the 80s at times. He was placed on nasal cannula and seems to be doing well on 2 L/m. His troponin is mildly elevated but his EKG does not show ischemia. He was started on heparin. I consulted  family practice who was on call for unassigned medicine patients who will admit the patient to a telemetry bed.  CRITICAL CARE Performed by: Britlee Skolnik Total critical care time: 60 Critical care time was exclusive of separately billable procedures and treating other patients. Critical care was necessary to treat or prevent imminent or life-threatening deterioration. Critical care was time spent personally by me on the following activities: development of treatment plan with patient and/or surrogate as well as nursing, discussions with consultants, evaluation of patient's response to treatment, examination of patient, obtaining history from patient or surrogate, ordering and performing treatments and interventions, ordering and review of laboratory studies, ordering and review of radiographic studies, pulse oximetry and re-evaluation of patient's condition.    Malvin Johns, MD 04/11/14 2132

## 2014-04-11 NOTE — ED Notes (Signed)
Phlebotomy enroute to draw labs.

## 2014-04-11 NOTE — ED Notes (Signed)
EKG done x2, 12 lead unremarkable, shows sinus tach.

## 2014-04-11 NOTE — ED Notes (Signed)
Per [facility] , pt from [facility]. Pt had chest pain, left chest non radating today around noon, progressively got worse. Pt given 1 nitro by facility. Pain initially 4/10, now is pain free upon arrival to ED. Pt given 324 asa by EMS. Pt is AAOX4, in NAD. Stated felt SOB earlier today. Pt had a stroke in Nov 2015, completely immobile on left side with minor sensation, also short term memory is bad.

## 2014-04-12 ENCOUNTER — Encounter (HOSPITAL_COMMUNITY): Payer: Self-pay | Admitting: Internal Medicine

## 2014-04-12 DIAGNOSIS — I2699 Other pulmonary embolism without acute cor pulmonale: Secondary | ICD-10-CM

## 2014-04-12 LAB — COMPREHENSIVE METABOLIC PANEL
ALT: 43 U/L (ref 0–53)
AST: 27 U/L (ref 0–37)
Albumin: 3.2 g/dL — ABNORMAL LOW (ref 3.5–5.2)
Alkaline Phosphatase: 73 U/L (ref 39–117)
Anion gap: 9 (ref 5–15)
BUN: 10 mg/dL (ref 6–23)
CO2: 25 mmol/L (ref 19–32)
Calcium: 8.9 mg/dL (ref 8.4–10.5)
Chloride: 107 mmol/L (ref 96–112)
Creatinine, Ser: 1.01 mg/dL (ref 0.50–1.35)
GFR calc Af Amer: 89 mL/min — ABNORMAL LOW (ref >90)
GFR calc non Af Amer: 77 mL/min — ABNORMAL LOW (ref >90)
Glucose, Bld: 106 mg/dL — ABNORMAL HIGH (ref 70–99)
Potassium: 3.4 mmol/L — ABNORMAL LOW (ref 3.5–5.1)
Sodium: 141 mmol/L (ref 135–145)
Total Bilirubin: 0.7 mg/dL (ref 0.3–1.2)
Total Protein: 6.8 g/dL (ref 6.0–8.3)

## 2014-04-12 LAB — APTT: aPTT: 194 seconds — ABNORMAL HIGH (ref 24–37)

## 2014-04-12 LAB — TROPONIN I
Troponin I: 0.31 ng/mL — ABNORMAL HIGH (ref <0.031)
Troponin I: 0.22 ng/mL — ABNORMAL HIGH (ref <0.031)
Troponin I: 0.18 ng/mL — ABNORMAL HIGH (ref <0.031)

## 2014-04-12 LAB — HEPARIN LEVEL (UNFRACTIONATED)
Heparin Unfractionated: 0.98 IU/mL — ABNORMAL HIGH (ref 0.30–0.70)
Heparin Unfractionated: 0.7 IU/mL (ref 0.30–0.70)
Heparin Unfractionated: 0.64 IU/mL (ref 0.30–0.70)

## 2014-04-12 LAB — CBC
HCT: 43.3 % (ref 39.0–52.0)
Hemoglobin: 13.6 g/dL (ref 13.0–17.0)
MCH: 26.4 pg (ref 26.0–34.0)
MCHC: 31.4 g/dL (ref 30.0–36.0)
MCV: 83.9 fL (ref 78.0–100.0)
Platelets: 193 10*3/uL (ref 150–400)
RBC: 5.16 MIL/uL (ref 4.22–5.81)
RDW: 14.8 % (ref 11.5–15.5)
WBC: 9 10*3/uL (ref 4.0–10.5)

## 2014-04-12 LAB — BRAIN NATRIURETIC PEPTIDE: B Natriuretic Peptide: 103.3 pg/mL — ABNORMAL HIGH (ref 0.0–100.0)

## 2014-04-12 LAB — PROTIME-INR
INR: 1.19 (ref 0.00–1.49)
Prothrombin Time: 15.2 seconds (ref 11.6–15.2)

## 2014-04-12 LAB — MAGNESIUM: Magnesium: 2.1 mg/dL (ref 1.5–2.5)

## 2014-04-12 MED ORDER — PERFLUTREN LIPID MICROSPHERE
1.0000 mL | INTRAVENOUS | Status: AC | PRN
  Administered 2014-04-12: 2 mL via INTRAVENOUS
  Administered 2014-04-12: 1 mL via INTRAVENOUS
  Filled 2014-04-12 (×2): qty 10

## 2014-04-12 NOTE — Progress Notes (Signed)
OT Cancellation Note  Patient Details Name: Gregory Wiley MRN: [id number] DOB: 1950/04/28   Cancelled Treatment:    Reason Eval/Treat Not Completed: Patient not medically ready. Pt on bed rest.   Benito Mccreedy OTR/L [phone] 04/12/2014, 12:45 PM

## 2014-04-12 NOTE — Progress Notes (Signed)
ANTICOAGULATION CONSULT NOTE - Follow Up Consult  Pharmacy Consult for Heparin  Indication: pulmonary embolus  Allergies  Allergen Reactions  . Diphenhydramine Hcl Anaphylaxis    Patient Measurements: Height: 6' (182.9 cm) Weight: (!) 300 lb 11.3 oz (136.4 kg) IBW/kg (Calculated) : 77.6  Vital Signs: Temp: 97.9 F (36.6 C) (01/26 0459) Temp Source: Oral (01/26 0459) BP: 136/73 mmHg (01/26 0500) Pulse Rate: 79 (01/26 0500)  Labs:  Recent Labs  04/11/14 1904 04/11/14 2310 04/12/14 0451 04/12/14 1056 04/12/14 1253  HGB 14.3  --  13.6  --   --   HCT 43.3  --  43.3  --   --   PLT 210  --  193  --   --   APTT  --   --  194*  --   --   LABPROT  --   --  15.2  --   --   INR  --   --  1.19  --   --   HEPARINUNFRC  --   --  0.98*  --  0.70  CREATININE 0.83  --  1.01  --   --   TROPONINI 0.17* 0.31* 0.22* 0.18*  --     Estimated Creatinine Clearance: 105.7 mL/min (by C-G formula based on Cr of 1.01).  Assessment: Heparin for large PE. Heparin level now in goal rane.   Goal of Therapy:  Heparin level 0.3-0.7 units/ml Monitor platelets by anticoagulation protocol: Yes   Plan:  -Cont heparin drip at 1700 units/hr -Check HL later today to confirm -Daily CBC/HL -Monitor for bleeding  Excell Seltzer Poteet 04/12/2014,2:06 PM

## 2014-04-12 NOTE — Progress Notes (Signed)
CSW (Clinical Education officer, museum) received call from [facility]. Pt was admitted from there facility not CIR. CSW to assess for return. Facility agreeable to pt returning when medically stable.  [place], [place]

## 2014-04-12 NOTE — Discharge Summary (Signed)
[facility] Discharge Summary  Patient name: Gregory Wiley Medical record number: [id number] Date of birth: [birth year] Age: 64 y.o. Gender: male Date of Admission: 04/11/2014  Date of Discharge: 04/13/2014  Admitting Physician: Lupita Dawn, MD  Primary Care Provider: Tamsen Roers, MD Consultants: CCM/pulmonology  Indication for Hospitalization: b/l submassive PEs  Discharge Diagnoses/Problem List:  Bilateral submassive pulmonary embolisms HLD HTN H/o recent CVA CAD, elevated troponins OSA  Disposition: SNF  Discharge Condition: stable  Discharge Exam: See progress note from day of discharge  Brief Hospital Course:  Gregory Wiley is a 64 y.o. male presenting with chest pain and dyspnea. 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  Patient presenting with chest pain and dyspnea.  No prior thrombosis or family history of thrombophilia. Initial Wells score was 4.5 placing patient in moderate risk group. Patient has had no recent trauma or surgery, stasis seems to be the only risk factor s/p CVA. CTA with large bilateral pulmonary emboli with associated right heart strain. D-dimer 6.85. Hemodynamically stable.  Initially managed with full-dose IV heparin. CCM consulted and found that patient not a candidate for thrombolysis.  Heparin transitioned to Xarelto on 1/27.  Patient did have new O2 requirement of 3L [place]. LE dopplers showed DVTs in L gastrocnemius, soleal veins and PT veins. Echo showed EF 25-30% (previously 45% in 01/2014), likely from heart strain with large PEs.  Home antihypertensives held during admission.  Troponins mildly elevated and peaked at 0.31 and then downtrended. Likely some demand ischemia in setting of large PEs.  EKG nonischemic.  Discussed antiplatelet therapy (previously on aspirin and plavix s/p CVA in 01/2014) with neurology on 1/26.  Per neurology, all right for  patient to be off all antiplatelets while anticoagulated.  Plavix discontinued, but continued aspirin for CAD and h/o CABG.  All other chronic medical conditions stable throughout admission and managed with home regimens,  Issues for Follow Up:  - anticoagulation - after 21 d, transition to Xarelto 20mg  once daily - ongoing PT/OT s/p CVA - f/u BP - restarting home Coreg at discharge, restart Imdur as BP allows  Significant Procedures: none  Significant Labs and Imaging:   Recent Labs Lab 04/11/14 1904 04/12/14 0451 04/13/14 0430  WBC 10.9* 9.0 7.9  HGB 14.3 13.6 13.2  HCT 43.3 43.3 40.9  PLT 210 193 178    Recent Labs Lab 04/11/14 1904 04/12/14 0451  NA 137 141  K 4.1 3.4*  CL 105 107  CO2 21 25  GLUCOSE 126* 106*  BUN 11 10  CREATININE 0.83 1.01  CALCIUM 9.1 8.9  MG  --  2.1  ALKPHOS  --  73  AST  --  27  ALT  --  43  ALBUMIN  --  3.2*     Ct Angio Chest Pe W/cm &/or Wo Cm (1/25): 1. Large bilateral pulmonary emboli within the main pulmonary arteries, extending distally into branches to all lobes of both lungs. RV/LV ratio 1.0 corresponds to right heart strain, and concern for submassive pulmonary embolus. 2. Lungs clear bilaterally. 3. Diffuse coronary artery calcifications seen.   Dg Chest Port 1 View (1/25): 1. Low lung volumes without radiographic evidence of acute cardiopulmonary disease. 2. Postoperative changes and support apparatus, as above.  EKG (1/26):  occasional PVCs, sinus rhythm, L axis deviation, nonischemic  LE Dopplers (1/26): pos for DVT in L gastrocnemius, soleal veins and PT veins  Echo (1/26): EF 25-30%, akinesis of anteroseptal and apical myocardium, grade 1 diastolic dysfunction   Results/Tests Pending at Time of Discharge: none  Discharge Medications:    Medication List    STOP taking these medications        amoxicillin 500 MG capsule  Commonly known as:  AMOXIL     clopidogrel 75 MG tablet  Commonly known as:  PLAVIX      isosorbide mononitrate 30 MG 24 hr tablet  Commonly known as:  IMDUR      TAKE these medications        acetaminophen 325 MG tablet  Commonly known as:  TYLENOL  Take 2 tablets (650 mg total) by mouth every 6 (six) hours as needed for mild pain.     ALPRAZolam 0.25 MG tablet  Commonly known as:  XANAX  Take 0.5 tablets (0.125 mg total) by mouth every 8 (eight) hours as needed for anxiety.     aspirin 81 MG EC tablet  Take 1 tablet (81 mg total) by mouth daily.     atorvastatin 80 MG tablet  Commonly known as:  LIPITOR  Take 1 tablet (80 mg total) by mouth daily at 6 PM.     carvedilol 3.125 MG tablet  Commonly known as:  COREG  Take 3.125 mg by mouth 2 (two) times daily with a meal.     colchicine 0.6 MG tablet  Take 0.6 mg by mouth as needed (for gout).     fluticasone 50 MCG/ACT nasal spray  Commonly known as:  FLONASE  Place 2 sprays into both nostrils daily.     furosemide 40 MG tablet  Commonly known as:  LASIX  Take 40 mg by mouth as needed for edema.     gabapentin 100 MG capsule  Commonly known as:  NEURONTIN  Take 1 capsule (100 mg total) by mouth 3 (three) times daily.     meclizine 25 MG tablet  Commonly known as:  ANTIVERT  Take 1 tablet (25 mg total) by mouth 3 (three) times daily as needed for dizziness.     methylphenidate 5 MG tablet  Commonly known as:  RITALIN  Take one tablet by mouth twice daily with breakfast and lunch     MUSCLE RUB 10-15 % Crea  Apply 1 application topically 2 (two) times daily. To left ankle and left shoulder     nitroGLYCERIN 0.4 MG SL tablet  Commonly known as:  NITROSTAT  Place 0.4 mg under the tongue every 5 (five) minutes as needed. For chest pain     OVER THE COUNTER MEDICATION  Inhale 1 application into the lungs at bedtime. C-PAP     pantoprazole 40 MG tablet  Commonly known as:  PROTONIX  Take 1 tablet (40 mg total) by mouth 2 (two) times daily.     predniSONE 5 MG tablet  Commonly known as:   DELTASONE  Take 5-25 mg by mouth daily. Started 04/09/14, for 5 days ending 04/14/14     protein supplement Powd  Take 1 scoop by mouth 3 (three) times daily with meals.     ranolazine 500 MG 12 hr tablet  Commonly known as:  RANEXA  Take 500 mg by mouth 2 (two) times daily.     Rivaroxaban 15 MG Tabs tablet  Commonly known as:  XARELTO  Take 1 tablet (15 mg total) by mouth 2 (two) times daily with a meal.     rivaroxaban 20 MG Tabs tablet  Commonly known  as:  XARELTO  Take 1 tablet (20 mg total) by mouth daily with supper.  Start taking on:  05/04/2014     Vitamin D (Ergocalciferol) 50000 UNITS Caps capsule  Commonly known as:  DRISDOL  Take 50,000 Units by mouth every 7 (seven) days. Take on Mondays        Discharge Instructions: Please refer to Patient Instructions section of EMR for full details.  Patient was counseled important signs and symptoms that should prompt return to medical care, changes in medications, dietary instructions, activity restrictions, and follow up appointments.   Follow-Up Appointments: Follow-up Information    Follow up with Jenkins Rouge, MD. Schedule an appointment as soon as possible for a visit in 1 month.   Specialty:  Cardiology   Why:  For hospital follow-up   Contact information:   [address] [address] [phone]       Lavon Paganini, MD 04/13/2014, 12:03 PM PGY-1, [place]

## 2014-04-12 NOTE — Progress Notes (Signed)
Nutrition Brief Note  Patient identified on the Malnutrition Screening Tool (MST) Report. Patient reports that he was eating poorly after stroke because he had a poor appetite. Appetite has improved and intake has been great for the past month.  Wt Readings from Last 15 Encounters:  04/12/14 300 lb 11.3 oz (136.4 kg)  03/10/14 317 lb 11.2 oz (144.108 kg)  02/12/14 350 lb (158.759 kg)  02/02/14 347 lb (157.398 kg)  02/01/14 347 lb (157.398 kg)  08/02/13 347 lb (157.398 kg)  06/29/13 340 lb (154.223 kg)  02/10/13 340 lb (154.223 kg)  01/25/13 344 lb (156.037 kg)  01/04/13 340 lb 6.4 oz (154.404 kg)  07/03/11 327 lb 12.8 oz (148.689 kg)    Body mass index is 40.77 kg/(m^2). Patient meets criteria for class 3, extreme/morbid obesity based on current BMI.   Current diet order is NPO for a procedure, previous diet heart healthy/CHO modified, patient was consuming 100% of meals. Labs and medications reviewed.   No nutrition interventions warranted at this time. If nutrition issues arise, please consult RD.    Molli Barrows, RD, LDN, [place] Pager [phone] After Hours Pager [phone]

## 2014-04-12 NOTE — Progress Notes (Signed)
Patient was screened by Gerlean Ren for appropriateness for an Inpatient Acute Rehab consult.  At this time, we are recommending pt. return to SNF level care and therapies following his acute hospitalization.  Please call if questions.  Thanks.  [place] Admissions Coordinator Cell [phone] Office [phone]

## 2014-04-12 NOTE — Progress Notes (Signed)
On call intern with family medicine text paged that left lower extremity doppler positive for DVT. Not notified of positive result by ultrasound technician. Patient already on heparin drip for pulmonary embolism.

## 2014-04-12 NOTE — Progress Notes (Addendum)
*  Preliminary Results* Bilateral lower extremity venous duplex completed. The right lower extremity negative for deep vein thrombosis. The left lower extremity is positive for deep vein thrombosis involving the left gastrocnemius, soleal veins, and posterior tibial veins. There is no evidence of Baker's cyst bilaterally.  04/12/2014  Maudry Mayhew, RVT, RDCS, RDMS

## 2014-04-12 NOTE — Progress Notes (Signed)
ANTICOAGULATION CONSULT NOTE - Follow Up Consult  Pharmacy Consult for Heparin  Indication: pulmonary embolus  Allergies  Allergen Reactions  . Diphenhydramine Hcl Anaphylaxis    Patient Measurements: Height: 6' (182.9 cm) Weight: (!) 300 lb 11.3 oz (136.4 kg) IBW/kg (Calculated) : 77.6  Vital Signs: Temp: 97.8 F (36.6 C) (01/26 2104) Temp Source: Oral (01/26 2104) BP: 128/74 mmHg (01/26 2104) Pulse Rate: 76 (01/26 2104)  Labs:  Recent Labs  04/11/14 1904 04/11/14 2310 04/12/14 0451 04/12/14 1056 04/12/14 1253 04/12/14 2015  HGB 14.3  --  13.6  --   --   --   HCT 43.3  --  43.3  --   --   --   PLT 210  --  193  --   --   --   APTT  --   --  194*  --   --   --   LABPROT  --   --  15.2  --   --   --   INR  --   --  1.19  --   --   --   HEPARINUNFRC  --   --  0.98*  --  0.70 0.64  CREATININE 0.83  --  1.01  --   --   --   TROPONINI 0.17* 0.31* 0.22* 0.18*  --   --     Estimated Creatinine Clearance: 105.7 mL/min (by C-G formula based on Cr of 1.01).  Assessment: Heparin for large PE. Heparin level now in goal range.   Confirmatory heparin level is therapeutic at 0.64 on heparin 1700 units/hr. CBC wnl. DVT involving L gastrocnemius, soleal veins, and posterior tibial veins found on venous duplex of LLE today in addition to PE. No bleeding noted.  Goal of Therapy:  Heparin level 0.3-0.7 units/ml Monitor platelets by anticoagulation protocol: Yes   Plan:  -Cont heparin drip at 1700 units/hr -Daily CBC/HL -Monitor for s/sx of bleeding -F/u long term anticoagulation plans  Andrey Cota. Diona Foley, PharmD Clinical Pharmacist Pager [phone] 04/12/2014,9:08 PM

## 2014-04-12 NOTE — Progress Notes (Signed)
Placed pt on CPAP with his home nasal pillows for the night at 6 cmH2O and pt is tolerating well at this time

## 2014-04-12 NOTE — Progress Notes (Signed)
ANTICOAGULATION CONSULT NOTE - Follow Up Consult  Pharmacy Consult for Heparin  Indication: pulmonary embolus  Allergies  Allergen Reactions  . Diphenhydramine Hcl Anaphylaxis    Patient Measurements: Height: 6' (182.9 cm) Weight: (!) 300 lb 11.3 oz (136.4 kg) IBW/kg (Calculated) : 77.6  Vital Signs: Temp: 97.9 F (36.6 C) (01/26 0459) Temp Source: Oral (01/26 0459) BP: 136/73 mmHg (01/26 0459) Pulse Rate: 97 (01/26 0459)  Labs:  Recent Labs  04/11/14 1904 04/11/14 2310 04/12/14 0451  HGB 14.3  --  13.6  HCT 43.3  --  43.3  PLT 210  --  193  APTT  --   --  194*  LABPROT  --   --  15.2  INR  --   --  1.19  HEPARINUNFRC  --   --  0.98*  CREATININE 0.83  --  1.01  TROPONINI 0.17* 0.31* 0.22*    Estimated Creatinine Clearance: 105.7 mL/min (by C-G formula based on Cr of 1.01).  Assessment: Heparin for large PE. Supra-therapeutic heparin level. Other labs as above.   Goal of Therapy:  Heparin level 0.3-0.7 units/ml Monitor platelets by anticoagulation protocol: Yes   Plan:  -Decrease heparin drip to 1700 units/hr -1300 HL -Daily CBC/HL -Monitor for bleeding  Narda Bonds 04/12/2014,6:37 AM

## 2014-04-12 NOTE — Care Management Note (Signed)
    Page 1 of 1   04/13/2014     3:28:25 PM CARE MANAGEMENT NOTE 04/13/2014  Patient:  PHILIPPE, GANG   Account Number:  [id number]  Date Initiated:  04/12/2014  Documentation initiated by:  GRAVES-BIGELOW,Benjie Ricketson  Subjective/Objective Assessment:   Pt admitted for cp and positive PE. Initiated on IV heparin gtt.  Pt is from [facility].     Action/Plan:   CSW to assist with disposition needs.   Anticipated DC Date:  04/14/2014   Anticipated DC Plan:  SKILLED NURSING FACILITY  In-house referral  Clinical Social Worker      DC Planning Services  CM consult      Choice offered to / List presented to:             Status of service:  Completed, signed off Medicare Important Message given?  NO (If response is "NO", the following Medicare IM given date fields will be blank) Date Medicare IM given:   Medicare IM given by:   Date Additional Medicare IM given:   Additional Medicare IM given by:    Discharge Disposition:  [place]  Per UR Regulation:  Reviewed for med. necessity/level of care/duration of stay  If discussed at [place] of Stay Meetings, dates discussed:    Comments:

## 2014-04-12 NOTE — Progress Notes (Signed)
  Echocardiogram 2D Echocardiogram with Definity has been performed.  Yosselyn Tax 04/12/2014, 11:16 AM

## 2014-04-12 NOTE — Progress Notes (Signed)
Family Medicine Teaching Service Daily Progress Note Intern Pager: [phone]  Patient name: Gregory Wiley Medical record number: [id number] Date of birth: 02-26-1951 Age: 64 y.o. Gender: male  Primary Care Provider: Tamsen Roers, MD Consultants: CCM Code Status: DNR (confirmed on admission)  Pt Overview and Major Events to Date:  1/25 - admit for b/l  Submassive PEs  Assessment and Plan:   JOHNTAVIOUS Wiley is a 64 y.o. male presenting with chest pain and dyspnea. 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  #Bilateral submassive PEs: Patient with no prior thrombosis or family history of thrombophilia. Initial Wells score was 4.5 placing patient in moderate risk group. Patient has had no recent trauma or surgery, stasis seems to be the only risk factor s/p CVA. CTA with large bilateral pulmonary emboli with associated right heart strain. D-dimer 6.85. HDS - Will trend troponins - 0.17, 0.31, 0.22  - Continue heparin at therapeutic levels per pharmacy - CCM consult; appreciate recs - not recommending thrombolysis - Oxygen as needed to keep oxygen saturation above 92% - nitroglycerin prn for chest pain - f/u LE dopplers and echo - Will think toward what NOAC to switch to  #HLD: last lipid panel 01/2014. LDL 120 at that time. - Continue home Lipitor  #HTN: Blood pressures currently normotensive. Not on any home meds. - Will monitor  #Recent CVA: Admitted by neurology 02/12/2014 for CVA. Patient now left sided hemiplegia from CVA. Patient was receiving outpatient rehab in SNF. - PT/OT eval/treat - monitor neuro status - Continue home gabapentin -continue home aspirin, hold home plavix, will likely need conversation with neuro and cards regarding plavix/aspirin/anticoagulation prior to discharge  #CAD, elevated troponins: Patient with extensive cardiac history. S/p multiple MIs and CABG x4. Likely some demand ischemia 2/2  PEs. - Will trend troponins - 0.17, 0.31, 0.22  -will continue aspirin - AM EKG - nonischemic  #OSA: CPAP ordered qhs  FEN/GI: NPO at midnight pending possible IR procedure Prophylaxis: therapeutic Heparin  Disposition: Currently in SDU, Dispo pending clinical improvement  Subjective:  Feels well today. Breathing comfortably but requiring 3L [place].  Objective: Temp:  [97.6 F (36.4 C)-98 F (36.7 C)] 97.9 F (36.6 C) (01/26 0459) Pulse Rate:  [97-116] 97 (01/26 0459) Resp:  [15-22] 16 (01/26 0459) BP: (124-144)/(66-99) 136/73 mmHg (01/26 0459) SpO2:  [88 %-97 %] 93 % (01/26 0459) FiO2 (%):  [4 %] 4 % (01/26 0459) Weight:  [300 lb 11.3 oz (136.4 kg)-300 lb 12.8 oz (136.442 kg)] 300 lb 11.3 oz (136.4 kg) (01/26 0459) Physical Exam: General: alert, well-developed, NAD, cooperative HEENT: NCAT, MMM, [place] in place Lungs: CTAB, normal respiratory effort, no crackles, and no wheezes.   Heart: RRR, no m/r/g. 2+ DP pulses b/l Abdomen: Bowel sounds normal; abdomen soft and nontender. No guarding, no rebound Extremities: No cyanosis or clubbing. trace edema. No calf tenderness or cords palopable Neurologic: A&Ox3, CN grossly intact, LUE and LLE strength 1/5 and 5/5 RUE and RLE strength, sensation to light touch intact bilateral UE and LE  Laboratory:  Recent Labs Lab 04/11/14 1904 04/12/14 0451  WBC 10.9* 9.0  HGB 14.3 13.6  HCT 43.3 43.3  PLT 210 193    Recent Labs Lab 04/11/14 1904 04/12/14 0451  NA 137 141  K 4.1 3.4*  CL 105 107  CO2 21 25  BUN 11 10  CREATININE 0.83 1.01  CALCIUM 9.1 8.9  PROT  --  6.8  BILITOT  --  0.7  ALKPHOS  --  73  ALT  --  43  AST  --  27  GLUCOSE 126* 106*    Imaging/Diagnostic Tests: Ct Angio Chest Pe W/cm &/or Wo Cm (1/25):  1. Large bilateral pulmonary emboli within the main pulmonary arteries, extending distally into branches to all lobes of both lungs. RV/LV ratio 1.0 corresponds to right heart strain, and concern for submassive  pulmonary embolus. 2. Lungs clear bilaterally. 3. Diffuse coronary artery calcifications seen.   Dg Chest Port 1 View (1/25): 1. Low lung volumes without radiographic evidence of acute cardiopulmonary disease. 2. Postoperative changes and support apparatus, as above.  EKG (1/26):  occasional PVCs, sinus rhythm, L axis deviation, nonischemic  Lavon Paganini, MD 04/12/2014, 7:28 AM PGY-1, [place] Intern pager: [phone], text pages welcome

## 2014-04-12 NOTE — Evaluation (Signed)
Physical Therapy Evaluation Patient Details Name: Gregory Wiley MRN: [id number] DOB: 1950-11-08 Today's Date: 04/12/2014   History of Present Illness  64 yo male returning after CVA with L hemiparesis, resulting in CIR admission but now returning from [place] for moderately large PE's.  PMHx:  CAD, CABG x 4, MI, OSA, CHF, HLD, CVA  Clinical Impression  Pt was given a very limited assessment of L LE to discuss with doctor and family had many questions as he is much weaker than just recently.  He is planning to get back to rehab ASAP and will agree to return to CIR to restore his previous progress.  When he is cleared by MD from PE then he can continue further rehab but until then is now on tx hold.    Follow Up Recommendations CIR;Supervision/Assistance - 24 hour    Equipment Recommendations  None recommended by PT    Recommendations for Other Services OT consult     Precautions / Restrictions Precautions Precautions: Other (comment) (telemetry) Restrictions Weight Bearing Restrictions: No Other Position/Activity Restrictions: not OOB until medically cleared      Mobility  Bed Mobility Overal bed mobility: Needs Assistance             General bed mobility comments: not able to aggressively test  Transfers                 General transfer comment: bedrest  Ambulation/Gait             General Gait Details: bedrest  Stairs            Wheelchair Mobility    Modified Rankin (Stroke Patients Only)       Balance                                             Pertinent Vitals/Pain Pain Assessment: No/denies pain    Home Living Family/patient expects to be discharged to:: Private residence Living Arrangements: Spouse/significant other;Children Available Help at Discharge: Family;Available 24 hours/day Type of Home: House Home Access: Stairs to enter Entrance Stairs-Rails: None Entrance Stairs-Number of Steps: 2 and small  threshold Home Layout: One level Home Equipment: Cane - single point;Hand held Tourist information centre manager - 2 wheels;Wheelchair - manual Additional Comments: wife has MS    Prior Function Level of Independence: Independent         Comments: driving     Hand Dominance   Dominant Hand: Right    Extremity/Trunk Assessment   Upper Extremity Assessment: Defer to OT evaluation           Lower Extremity Assessment: LLE deficits/detail   LLE Deficits / Details: Dense weakness with L ankle in PF contracture, unable to get to neutral.  L knee ext is 2-, flexion 2- and hip 1+.    Cervical / Trunk Assessment: Normal  Communication   Communication: No difficulties  Cognition Arousal/Alertness: Awake/alert Behavior During Therapy: WFL for tasks assessed/performed Overall Cognitive Status: Within Functional Limits for tasks assessed                      General Comments General comments (skin integrity, edema, etc.): Pt was apparently referred due to increased LLE weakness since his admission and  concern for worsening of previous L side symptoms.    Exercises        Assessment/Plan  PT Assessment Patient needs continued PT services  PT Diagnosis Generalized weakness;Hemiplegia non-dominant side   PT Problem List Decreased strength;Decreased range of motion;Decreased activity tolerance;Other (comment) (limited evaluation due to situation with PE)  PT Treatment Interventions DME instruction;Gait training;Functional mobility training;Therapeutic activities;Therapeutic exercise;Balance training;Neuromuscular re-education;Patient/family education   PT Goals (Current goals can be found in the Care Plan section) Acute Rehab PT Goals Patient Stated Goal: to get going again PT Goal Formulation: With patient/family Time For Goal Achievement: 04/23/14 Potential to Achieve Goals: Good    Frequency Min 3X/week   Barriers to discharge Inaccessible home environment;Decreased  caregiver support (wife has MS)      Co-evaluation               End of Session   Activity Tolerance: Treatment limited secondary to medical complications (Comment) Patient left: in bed;with call bell/phone within reach;with family/visitor present Nurse Communication: Precautions         Time: 6606-3016 PT Time Calculation (min) (ACUTE ONLY): 23 min   Charges:   PT Evaluation $Initial PT Evaluation Tier I: 1 Procedure PT Treatments $Therapeutic Exercise: 8-22 mins   PT G Codes:        Ramond Dial 04/22/14, 12:34 PM   Mee Hives, PT [place] Acute Rehab Dept. Number: [phone]

## 2014-04-13 LAB — CBC
HCT: 40.9 % (ref 39.0–52.0)
Hemoglobin: 13.2 g/dL (ref 13.0–17.0)
MCH: 26.8 pg (ref 26.0–34.0)
MCHC: 32.3 g/dL (ref 30.0–36.0)
MCV: 83 fL (ref 78.0–100.0)
Platelets: 178 10*3/uL (ref 150–400)
RBC: 4.93 MIL/uL (ref 4.22–5.81)
RDW: 14.9 % (ref 11.5–15.5)
WBC: 7.9 10*3/uL (ref 4.0–10.5)

## 2014-04-13 LAB — HEPARIN LEVEL (UNFRACTIONATED): Heparin Unfractionated: 0.68 IU/mL (ref 0.30–0.70)

## 2014-04-13 MED ORDER — RIVAROXABAN 20 MG PO TABS: 20.0000 mg | ORAL_TABLET | Freq: Every day | ORAL | Status: DC

## 2014-04-13 MED ORDER — RIVAROXABAN 15 MG PO TABS
15.0000 mg | ORAL_TABLET | Freq: Two times a day (BID) | ORAL | Status: DC
  Administered 2014-04-13 (×2): 15 mg via ORAL
  Filled 2014-04-13 (×2): qty 1

## 2014-04-13 MED ORDER — RIVAROXABAN 15 MG PO TABS: 15.0000 mg | ORAL_TABLET | Freq: Two times a day (BID) | ORAL | Status: DC

## 2014-04-13 NOTE — Progress Notes (Signed)
ANTICOAGULATION CONSULT NOTE - Follow Up Consult  Pharmacy Consult for Heparin  Indication: pulmonary embolus, DVT  Allergies  Allergen Reactions  . Diphenhydramine Hcl Anaphylaxis    Patient Measurements: Height: 6' (182.9 cm) Weight: (!) 303 lb 11.2 oz (137.757 kg) IBW/kg (Calculated) : 77.6  Vital Signs: Temp: 97.5 F (36.4 C) (01/27 0518) Temp Source: Oral (01/27 0518) BP: 123/73 mmHg (01/27 0518) Pulse Rate: 71 (01/27 0518)  Labs:  Recent Labs  04/11/14 1904 04/11/14 2310  04/12/14 0451 04/12/14 1056 04/12/14 1253 04/12/14 2015 04/13/14 0430  HGB 14.3  --   --  13.6  --   --   --  13.2  HCT 43.3  --   --  43.3  --   --   --  40.9  PLT 210  --   --  193  --   --   --  178  APTT  --   --   --  194*  --   --   --   --   LABPROT  --   --   --  15.2  --   --   --   --   INR  --   --   --  1.19  --   --   --   --   HEPARINUNFRC  --   --   < > 0.98*  --  0.70 0.64 0.68  CREATININE 0.83  --   --  1.01  --   --   --   --   TROPONINI 0.17* 0.31*  --  0.22* 0.18*  --   --   --   < > = values in this interval not displayed.  Estimated Creatinine Clearance: 106.3 mL/min (by C-G formula based on Cr of 1.01).  Assessment: Heparin for large PE and DVT.  CBC wnl. No bleeding noted. Heparin level therapeutic.  Goal of Therapy:  Heparin level 0.3-0.7 units/ml Monitor platelets by anticoagulation protocol: Yes   Plan:  -Cont heparin drip at 1700 units/hr -Daily CBC/HL -Monitor for s/sx of bleeding -F/u long term anticoagulation plans  Thanks for allowing pharmacy to be a part of this patient's care.  Excell Seltzer, PharmD Clinical Pharmacist, [phone] 04/13/2014,7:30 AM

## 2014-04-13 NOTE — Progress Notes (Signed)
PT Cancellation Note  Patient Details Name: Gregory Wiley MRN: [id number] DOB: 1951-03-12   Cancelled Treatment:    Reason Eval/Treat Not Completed: Patient not medically ready;Other (comment) (INR not therapeutic yet, medical hold)   Ramond Dial 04/13/2014, 10:01 AM   Mee Hives, PT [place] Acute Rehab Dept. Number: [phone]

## 2014-04-13 NOTE — Progress Notes (Signed)
Clinical Social Work Department BRIEF PSYCHOSOCIAL ASSESSMENT 04/13/2014  Patient:  Gregory Wiley, Gregory Wiley     Account Number:  [id number]     Admit date:  04/11/2014  Clinical Social Worker:  Adair Laundry  Date/Time:  04/12/2014 04:00 PM  Referred by:  RN  Date Referred:  04/12/2014 Referred for  SNF Placement   Other Referral:   Interview type:  Patient Other interview type:    PSYCHOSOCIAL DATA Living Status:  FACILITY Admitted from facility:  [facility] Level of care:  [place] Primary support name:  Tenoch Mcclure Primary support relationship to patient:  SPOU[place] Degree of support available:   Pt has strong family and community support    CURRENT CONCERNS Current Concerns  Post-Acute Placement   Other Concerns:    SOCIAL WORK ASSESSMENT / PLAN CSW made aware that pt was admitted from SNF. CSW visited pt room and pt laying in bed resting. Pt in good spirits. Pt confirmed he was admitted from [facility] and is receiving ST rehab. Pt reports that rehab is going well and he is prepared to return at discharge. Pt had no questions or concerns.   Assessment/plan status:  Psychosocial Support/Ongoing Assessment of Needs Other assessment/ plan:   Information/referral to community resources:   None needed    PATIENT'S/FAMILY'S RESPONSE TO PLAN OF CARE: Pt pleasant and coopeartive. He is agreeale to returning to SNF.      [place], [place]

## 2014-04-13 NOTE — Discharge Instructions (Addendum)
You were admitted for pulmonary embolisms.  Pulmonary Embolism A pulmonary (lung) embolism (PE) is a blood clot that has traveled to the lung and results in a blockage of blood flow in the affected lung. Most clots come from deep veins in the legs or pelvis. PE is a dangerous and potentially life-threatening condition that can be treated if identified. CAUSES Blood clots form in a vein for different reasons. Usually several things cause blood clots. They include:  The flow of blood slows down.  The inside of the vein is damaged in some way.  The person has a condition that makes the blood clot more easily. RISK FACTORS Some people are more likely than others to develop PE. Risk factors include:   Smoking.  Being overweight (obese).  Sitting or lying still for a long time. This includes long-distance travel, paralysis, or recovery from an illness or surgery. Other factors that increase risk are:   Older age, especially over 42 years of age.  Having a family history of blood clots or if you have already had a blood clot.  Having major or lengthy surgery. This is especially true for surgery on the hip, knee, or belly (abdomen). Hip surgery is particularly high risk.  Having a long, thin tube (catheter) placed inside a vein during a medical procedure.  Breaking a hip or leg.  Having cancer or cancer treatment.  Medicines containing the male hormone estrogen. This includes birth control pills and hormone replacement therapy.  Other circulation or heart problems.  Pregnancy and childbirth.  Hormone changes make the blood clot more easily during pregnancy.  The fetus puts pressure on the veins of the pelvis.  There is a risk of injury to veins during delivery or a caesarean delivery. The risk is highest just after childbirth.  PREVENTION   Exercise the legs regularly. Take a brisk 30 minute walk every day.  Maintain a weight that is appropriate for your height.  Avoid  sitting or lying in bed for long periods of time without moving your legs.  Women, particularly those over the age of 36 years, should consider the risks and benefits of taking estrogen medicines, including birth control pills.  Do not smoke, especially if you take estrogen medicines.  Long-distance travel can increase your risk. You should exercise your legs by walking or pumping the muscles every hour.  Many of the risk factors above relate to situations that exist with hospitalization, either for illness, injury, or elective surgery. Prevention may include medical and nonmedical measures.   Your health care provider will assess you for the need for venous thromboembolism prevention when you are admitted to the hospital. If you are having surgery, your surgeon will assess you the day of or day after surgery.  SYMPTOMS  The symptoms of a PE usually start suddenly and include:  Shortness of breath.  Coughing.  Coughing up blood or blood-tinged mucus.  Chest pain. Pain is often worse with deep breaths.  Rapid heartbeat. DIAGNOSIS  If a PE is suspected, your health care provider will take a medical history and perform a physical exam. Other tests that may be required include:  Blood tests, such as studies of the clotting properties of your blood.  Imaging tests, such as ultrasound, CT, MRI, and other tests to see if you have clots in your legs or lungs.  An electrocardiogram. This can look for heart strain from blood clots in the lungs. TREATMENT   The most common treatment for a PE  is blood thinning (anticoagulant) medicine, which reduces the blood's tendency to clot. Anticoagulants can stop new blood clots from forming and old clots from growing. They cannot dissolve existing clots. Your body does this by itself over time. Anticoagulants can be given by mouth, through an intravenous (IV) tube, or by injection. Your health care provider will determine the best program for  you.  Less commonly, clot-dissolving medicines (thrombolytics) are used to dissolve a PE. They carry a high risk of bleeding, so they are used mainly in severe cases.  Very rarely, a blood clot in the leg needs to be removed surgically.  If you are unable to take anticoagulants, your health care provider may arrange for you to have a filter placed in a main vein in your abdomen. This filter prevents clots from traveling to your lungs. HOME CARE INSTRUCTIONS   Take all medicines as directed by your health care provider.  Learn as much as you can about DVT.  Wear a medical alert bracelet or carry a medical alert card.  Ask your health care provider how soon you can go back to normal activities. It is important to stay active to prevent blood clots. If you are on anticoagulant medicine, avoid contact sports.  It is very important to exercise. This is especially important while traveling, sitting, or standing for long periods of time. Exercise your legs by walking or by tightening and relaxing your leg muscles regularly. Take frequent walks.  You may need to wear compression stockings. These are tight elastic stockings that apply pressure to the lower legs. This pressure can help keep the blood in the legs from clotting. Taking Warfarin Warfarin is a daily medicine that is taken by mouth. Your health care provider will advise you on the length of treatment (usually 3-6 months, sometimes lifelong). If you take warfarin:  Understand how to take warfarin and foods that can affect how warfarin works in Veterinary surgeon.  Too much and too little warfarin are both dangerous. Too much warfarin increases the risk of bleeding. Too little warfarin continues to allow the risk for blood clots. Warfarin and Regular Blood Testing While taking warfarin, you will need to have regular blood tests to measure your blood clotting time. These blood tests usually include both the prothrombin time (PT) and international  normalized ratio (INR) tests. The PT and INR results allow your health care provider to adjust your dose of warfarin. It is very important that you have your PT and INR tested as often as directed by your health care provider.  Warfarin and Your Diet Avoid major changes in your diet, or notify your health care provider before changing your diet. Arrange a visit with a registered dietitian to answer your questions. Many foods, especially foods high in vitamin K, can interfere with warfarin and affect the PT and INR results. You should eat a consistent amount of foods high in vitamin K. Foods high in vitamin K include:   Spinach, kale, broccoli, cabbage, collard and turnip greens, Brussels sprouts, peas, cauliflower, seaweed, and parsley.  Beef and pork liver.  Green tea.  Soybean oil. Warfarin with Other Medicines Many medicines can interfere with warfarin and affect the PT and INR results. You must:  Tell your health care provider about any and all medicines, vitamins, and supplements you take, including aspirin and other over-the-counter anti-inflammatory medicines. Be especially cautious with aspirin and anti-inflammatory medicines. Ask your health care provider before taking these.  Do not take or discontinue any prescribed  or over-the-counter medicine except on the advice of your health care provider or pharmacist. Warfarin Side Effects Warfarin can have side effects, such as easy bruising and difficulty stopping bleeding. Ask your health care provider or pharmacist about other side effects of warfarin. You will need to:  Hold pressure over cuts for longer than usual.  Notify your dentist and other health care providers that you are taking warfarin before you undergo any procedures where bleeding may occur. Warfarin with Alcohol and Tobacco   Drinking alcohol frequently can increase the effect of warfarin, leading to excess bleeding. It is best to avoid alcoholic drinks or consume only  very small amounts while taking warfarin. Notify your health care provider if you change your alcohol intake.  Do not use any tobacco products including cigarettes, chewing tobacco, or electronic cigarettes. If you smoke, quit. Ask your health care provider for help with quitting smoking. Alternative Medicines to Warfarin: Factor Xa Inhibitor Medicines  These blood thinning medicines are taken by mouth, usually for several weeks or longer. It is important to take the medicine every single day, at the same time each day.  There are no regular blood tests required when using these medicines.  There are fewer food and drug interactions than with warfarin.  The side effects of this class of medicine is similar to that of warfarin, including excessive bruising or bleeding. Ask your health care provider or pharmacist about other potential side effects. SEEK MEDICAL CARE IF:   You notice a rapid heartbeat.  You feel weaker or more tired than usual.  You feel faint.  You notice increased bruising.  Your symptoms are not getting better in the time expected.  You are having side effects of medicine. SEEK IMMEDIATE MEDICAL CARE IF:   You have chest pain.  You have trouble breathing.  You have new or increased swelling or pain in one leg.  You cough up blood.  You notice blood in vomit, in a bowel movement, or in urine.  You have a fever. Symptoms of PE may represent a serious problem that is an emergency. Do not wait to see if the symptoms will go away. Get medical help right away. Call your local emergency services (911 in the [place]). Do not drive yourself to the hospital. Document Released: 03/01/2000 Document Revised: 07/19/2013 Document Reviewed: 03/15/2013 [facility] Patient Information 2015 [place], [place]. This information is not intended to replace advice given to you by your health care provider. Make sure you discuss any questions you have with your health care  provider.   Information on my medicine - XARELTO (rivaroxaban)  This medication education was reviewed with me or my healthcare representative as part of my discharge preparation.  The pharmacist that spoke with me during my hospital stay was:  Seay, Alex Gardener, [place]? Xarelto was prescribed to treat blood clots that may have been found in the veins of your legs (deep vein thrombosis) or in your lungs (pulmonary embolism) and to reduce the risk of them occurring again.  What do you need to know about Xarelto? The starting dose is one 15 mg tablet taken TWICE daily with food for the FIRST 21 DAYS then on 2/17 the dose is changed to one 20 mg tablet taken ONCE A DAY with your evening meal.  DO NOT stop taking Xarelto without talking to the health care provider who prescribed the medication.  Refill your prescription for 20 mg tablets before you run  out.  After discharge, you should have regular check-up appointments with your healthcare provider that is prescribing your Xarelto.  In the future your dose may need to be changed if your kidney function changes by a significant amount.  What do you do if you miss a dose? If you are taking Xarelto TWICE DAILY and you miss a dose, take it as soon as you remember. You may take two 15 mg tablets (total 30 mg) at the same time then resume your regularly scheduled 15 mg twice daily the next day.  If you are taking Xarelto ONCE DAILY and you miss a dose, take it as soon as you remember on the same day then continue your regularly scheduled once daily regimen the next day. Do not take two doses of Xarelto at the same time.   Important Safety Information Xarelto is a blood thinner medicine that can cause bleeding. You should call your healthcare provider right away if you experience any of the following: ? Bleeding from an injury or your nose that does not stop. ? Unusual colored urine (red or dark brown) or  unusual colored stools (red or black). ? Unusual bruising for unknown reasons. ? A serious fall or if you hit your head (even if there is no bleeding).  Some medicines may interact with Xarelto and might increase your risk of bleeding while on Xarelto. To help avoid this, consult your healthcare provider or pharmacist prior to using any new prescription or non-prescription medications, including herbals, vitamins, non-steroidal anti-inflammatory drugs (NSAIDs) and supplements.  This website has more information on Xarelto: [email or web address]

## 2014-04-13 NOTE — Progress Notes (Addendum)
CSW (Clinical Education officer, museum) notified by nursing that pt wife would like to speak. CSW visited pt room and spoke with pt wife. She informed CSW that she wants pt to dc to [facility]. CSW notified pt wife that this may not be possible to arrange as pt is ready for discharge today. CSW explained that CSW would send out referral but it may be necessary for pt to return to [facility] and for family to work with [facility] on transferring. Pt wife upset with this information but expressed understanding that CSW will try to work out switching facilities.  ADDENDUM 2:15pm: CSW notified pt and pt wife that [facility] cannot offer a bed. CSW confirmed that plan can proceed with discharge to [facility]. However, pt and pt wife not agreeable at this time. Pt wife wanting to speak with [facility] before pt transport is scheduled.  ADDENDUm 3:18pm: Pt wife still waiting to speak with [facility] admissions office. CSW called and left voicemail with this facility asking for call back. If no return phone call in 30 minutes, will notify pt and pt wife that discharge location needs to be decided as pt has been discharged by MD.  [place], [place]

## 2014-04-13 NOTE — Progress Notes (Signed)
Family Medicine Teaching Service Daily Progress Note Intern Pager: [phone]  Patient name: Gregory Wiley Medical record number: [id number] Date of birth: Jan 28, 1951 Age: 64 y.o. Gender: male  Primary Care Provider: Tamsen Roers, MD Consultants: CCM Code Status: DNR (confirmed on admission)  Pt Overview and Major Events to Date:  1/25 - admit for b/l  Submassive PEs  Assessment and Plan:   LETCHER SCHWEIKERT is a 64 y.o. male presenting with chest pain and dyspnea. 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  #Bilateral submassive PEs: Patient with no prior thrombosis or family history of thrombophilia. Initial Wells score was 4.5 placing patient in moderate risk group. Patient has had no recent trauma or surgery, stasis seems to be the only risk factor s/p CVA. CTA with large bilateral pulmonary emboli with associated right heart strain. D-dimer 6.85. HDS - Will trend troponins - 0.17, 0.31, 0.22, 0.18 - likely some demand ischemia in setting of PEs but downtrending - Transition from heparin to NOAC (likely Xarelto) today - CCM consult; appreciate recs - not recommending thrombolysis (signed off) - Oxygen as needed to keep oxygen saturation above 92% - wean as tolerated - nitroglycerin prn for chest pain - LE dopplers - pos for DVT in L gastrocnemius, soleal veins and PT veins - Echo - EF 25-30% (previously 45% in 01/2014)  #HLD: last lipid panel 01/2014. LDL 120 at that time. - Continue home Lipitor  #HTN: Blood pressures currently normotensive. Not on any home meds. - Will monitor  #Recent CVA: Admitted by neurology 02/12/2014 for CVA. Patient now left sided hemiplegia from CVA. Patient was receiving outpatient rehab in SNF. - PT/OT eval/treat - monitor neuro status - Continue home gabapentin - continue home aspirin (mostly for CAD) - Discussed with neurology 1/26 - ok to stop antiplatelets while anticoagulated - plavix  d/c'd  #CAD, elevated troponins: Patient with extensive cardiac history. S/p multiple MIs and CABG x4. Likely some demand ischemia 2/2 PEs. - Will trend troponins - 0.17, 0.31, 0.22, 0.18 - will continue aspirin - EKG - nonischemic  #OSA: CPAP ordered qhs  FEN/GI: heart healthy/carb modified diet Prophylaxis: therapeutic Heparin -> Xarelto today  Disposition: Currently in SDU, Dispo pending clinical improvement. Likely back to SNF today  Subjective:  Feels well today. Able to move LUE and LLE recently. Breathing normal.  Objective: Temp:  [97.5 F (36.4 C)-97.8 F (36.6 C)] 97.5 F (36.4 C) (01/27 0518) Pulse Rate:  [71-91] 71 (01/27 0518) Resp:  [16-26] 16 (01/27 0518) BP: (115-128)/(73-84) 123/73 mmHg (01/27 0518) SpO2:  [95 %-98 %] 96 % (01/27 0518) Weight:  [303 lb 11.2 oz (137.757 kg)] 303 lb 11.2 oz (137.757 kg) (01/27 0518) Physical Exam: General: alert, well-developed, NAD, cooperative HEENT: NCAT, MMM, [place] in place Lungs: CTAB, normal respiratory effort, no crackles, and no wheezes.   Heart: RRR, no m/r/g. 2+ DP pulses b/l Abdomen: Bowel sounds normal; abdomen soft and nontender. No guarding, no rebound Extremities: No cyanosis or clubbing. trace edema. No calf tenderness or cords palopable Neurologic: A&Ox3, CN grossly intact  Laboratory:  Recent Labs Lab 04/11/14 1904 04/12/14 0451 04/13/14 0430  WBC 10.9* 9.0 7.9  HGB 14.3 13.6 13.2  HCT 43.3 43.3 40.9  PLT 210 193 178    Recent Labs Lab 04/11/14 1904 04/12/14 0451  NA 137 141  K 4.1 3.4*  CL 105 107  CO2 21 25  BUN 11 10  CREATININE 0.83  1.01  CALCIUM 9.1 8.9  PROT  --  6.8  BILITOT  --  0.7  ALKPHOS  --  73  ALT  --  43  AST  --  27  GLUCOSE 126* 106*    Imaging/Diagnostic Tests: Ct Angio Chest Pe W/cm &/or Wo Cm (1/25):  1. Large bilateral pulmonary emboli within the main pulmonary arteries, extending distally into branches to all lobes of both lungs. RV/LV ratio 1.0 corresponds to  right heart strain, and concern for submassive pulmonary embolus. 2. Lungs clear bilaterally. 3. Diffuse coronary artery calcifications seen.   Dg Chest Port 1 View (1/25): 1. Low lung volumes without radiographic evidence of acute cardiopulmonary disease. 2. Postoperative changes and support apparatus, as above.  EKG (1/26):  occasional PVCs, sinus rhythm, L axis deviation, nonischemic  LE Dopplers (1/26): pos for DVT in L gastrocnemius, soleal veins and PT veins  Echo (1/26): EF 25-30%, akinesis of anteroseptal and apical myocardium, grade 1 diastolic dysfunction  Lavon Paganini, MD 04/13/2014, 7:38 AM PGY-1, [place] Intern pager: [phone], text pages welcome

## 2014-04-13 NOTE — Progress Notes (Signed)
OT Cancellation Note  Patient Details Name: Gregory Wiley MRN: [id number] DOB: 06-24-1950   Cancelled Treatment:    Reason Eval/Treat Not Completed: OT screened. Pt is from SNF and current D/C plan is to return to SNF. No apparent immediate acute care OT needs, therefore will defer OT to SNF. If OT eval is needed please call Acute Rehab Dept. at [phone] or text page OT at [phone].      Benito Mccreedy OTR/L [phone] 04/13/2014, 4:16 PM

## 2014-04-13 NOTE — Progress Notes (Signed)
CSW Armed forces technical officer) spoke with pt and pt wife. Pt wife was able to speak with [facility] and is now agreeable to discharge plan for pt to return to [facility]. CSW prepared pt dc packet and placed with shadow chart. CSW arranged non-emergent ambulance transport. Pt, pt family, pt nurse, and facility informed. CSW signing off.  [place], [place]

## 2014-04-13 NOTE — Discharge Summary (Signed)
Attempted to call report to [facility] multiple times and unable to reach staff. [facility] has arrived to transport patient. Written report to be taken with patient to be given to nurse at [facility].

## 2014-04-14 ENCOUNTER — Other Ambulatory Visit: Payer: Self-pay | Admitting: *Deleted

## 2014-04-14 MED ORDER — METHYLPHENIDATE HCL 5 MG PO TABS: ORAL_TABLET | ORAL | Status: DC

## 2014-04-14 MED ORDER — ALPRAZOLAM 0.25 MG PO TABS: 0.1250 mg | ORAL_TABLET | Freq: Three times a day (TID) | ORAL | Status: DC | PRN

## 2014-04-14 NOTE — Telephone Encounter (Signed)
[facility] 

## 2014-04-14 NOTE — Telephone Encounter (Signed)
[facility] 

## 2014-04-15 ENCOUNTER — Ambulatory Visit (INDEPENDENT_AMBULATORY_CARE_PROVIDER_SITE_OTHER): Payer: 59 | Admitting: *Deleted

## 2014-04-15 DIAGNOSIS — I63411 Cerebral infarction due to embolism of right middle cerebral artery: Secondary | ICD-10-CM

## 2014-04-17 DIAGNOSIS — R0602 Shortness of breath: Secondary | ICD-10-CM | POA: Insufficient documentation

## 2014-04-17 DIAGNOSIS — R079 Chest pain, unspecified: Secondary | ICD-10-CM | POA: Insufficient documentation

## 2014-04-17 NOTE — Progress Notes (Signed)
Patient ID: Gregory Wiley, male   DOB: 1950/11/02, 64 y.o.   MRN: [id number]   this is an acute visit.  Level care skilled.  [facility].   Chief Complaint  Patient presents with  .  acute visit secondary to chest pain-shortness of breath     HPI: Patient is 64 y.o. 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  Reports the chest pain is midsternal with no radiation he does have some heaviness and shortness of breath.  Vital signs manually show a blood pressure 160/100 he is tachycardic at 120 he is currently afebrile O2 sats ration is in the 90s on room air.  He does not appear to be in acute distress but says he is uncomfortable  Past Medical History  Diagnosis Date  . Coronary artery disease     stent, then CABG 07/20/10  . MI (myocardial infarction)     NSTEMI- last cath 06/2011-Stent to LCX-DES, last nuc 07/30/11 low risk  . Diabetes mellitus     hx of, diet controlled  . Hypertension   . Hyperlipidemia   . Obesity (BMI 30-39.9)   . OSA on CPAP     since July 2013  . H/O cardiomyopathy     ischemic, now last echo 07/30/11, EF 38%W  . Diverticulosis   . Internal hemorrhoids   . Tubular adenoma of colon   . Plantar fasciitis of left foot   . Stroke     Past Surgical History  Procedure Laterality Date  . Coronary artery bypass graft  07/20/2010     LIMA-LAD; VG-ACUTE MARG of RCA; Seq VG-distal RCA & then pda  . Coronary angioplasty with stent placement  07/01/2011    DES-Resolute to native LCX  . Coronary angioplasty with stent placement  12/01/2007    COMPLEX 5 LESION PCI INCLUDING CUTTING BALLOON AND 4 CYPHER DESs  . Hernia repair  2006  . Knee surgery Right   . Hand surgery      to take glass out  . Colonoscopy N/A 02/10/2013    Procedure: COLONOSCOPY;  Surgeon: Ladene Artist, MD;  Location: [facility] ENDOSCOPY;  Service: Endoscopy;  Laterality: N/A;  . Retinal detachment surgery    . Loop recorder implant  02/15/2014    MDT LINQ implanted by Dr Rayann Heman for cryptogenic stroke  . Tee without cardioversion N/A 02/15/2014    Procedure: TRANSESOPHAGEAL ECHOCARDIOGRAM (TEE);  Surgeon: Josue Hector, MD;  Location: [facility] ENDOSCOPY;  Service: Cardiovascular;  Laterality: N/A;  . Left heart catheterization with coronary/graft angiogram  07/01/2011    Procedure: LEFT HEART CATHETERIZATION WITH Beatrix Fetters;  Surgeon: Sanda Klein, MD;  Location: [facility] CATH LAB;  Service: Cardiovascular;;  . Percutaneous coronary stent intervention (pci-s) Right 07/01/2011    Procedure: PERCUTANEOUS CORONARY STENT INTERVENTION (PCI-S);  Surgeon: Sanda Klein, MD;  Location: [facility] CATH LAB;  Service: Cardiovascular;  Laterality: Right;      Medication List                       acetaminophen 325 MG tablet  Commonly known as:  TYLENOL  Take 2 tablets (650 mg total) by mouth every 6 (six) hours as needed for mild pain.     ALPRAZolam 0.25 MG tablet  Commonly  known as:  XANAX  Take 0.5 tablets (0.125 mg total) by mouth every 8 (eight) hours as needed for anxiety.     aspirin 81 MG EC tablet  Take 1 tablet (81 mg total) by mouth daily.     atorvastatin 80 MG tablet  Commonly known as:  LIPITOR  Take 1 tablet (80 mg total) by mouth daily at 6 PM.     clopidogrel 75 MG tablet  Commonly known as:  PLAVIX  Take 75 mg by mouth daily.     clopidogrel 75 MG tablet  Commonly known as:  PLAVIX  Take 1 tablet (75 mg total) by mouth daily.     feeding supplement (PRO-STAT SUGAR FREE 64) Liqd  Take 30 mLs by mouth 2 (two) times daily between meals.     fluticasone 50 MCG/ACT nasal spray  Commonly known as:  FLONASE  Place 2 sprays into both nostrils daily.     gabapentin 100 MG capsule  Commonly known as:  NEURONTIN  Take 1 capsule (100 mg total) by mouth 3 (three) times  daily.     meclizine 25 MG tablet  Commonly known as:  ANTIVERT  Take 1 tablet (25 mg total) by mouth 3 (three) times daily as needed for dizziness.     methylphenidate 5 MG tablet  Commonly known as:  RITALIN  Take 1 tablet (5 mg total) by mouth 2 (two) times daily with breakfast and lunch.     MUSCLE RUB 10-15 % Crea  Apply 1 application topically 2 (two) times daily. To left ankle and left shoulder     nitroGLYCERIN 0.4 MG SL tablet  Commonly known as:  NITROSTAT  Place 0.4 mg under the tongue every 5 (five) minutes as needed. For chest pain     pantoprazole 40 MG tablet  Commonly known as:  PROTONIX  Take 1 tablet (40 mg total) by mouth 2 (two) times daily.     sennosides 8.8 MG/5ML syrup  Commonly known as:  SENOKOT  Take 5 mLs by mouth 2 (two) times daily.     Vitamin D (Ergocalciferol) 50000 UNITS Caps capsule  Commonly known as:  DRISDOL  Take 50,000 Units by mouth every 7 (seven) days. Take on Mondays        No orders of the defined types were placed in this encounter.     There is no immunization history on file for this patient.  History  Substance Use Topics  . Smoking status: Former Research scientist (life sciences)  . Smokeless tobacco: Never Used  . Alcohol Use: Yes     Comment: 1-2 times per year    Family history is noncontributory    Review of Systems  DATA OBTAINED: from patient, nurse, family member GENERAL:  no fevers, fatigue, appetite changes SKIN: No itching, rash or wounds EYES: No eye pain, redness, discharge EARS: No earache, tinnitus, change in hearing NOSE: No congestion, drainage or bleeding  MOUTH/THROAT: No mouth or tooth pain, No sore throat RESPIRATORY: No cough, wheezing, but complains of shortness of breath CARDIAC: Has midsternal chest pain without radiation  GI: No abdominal pain, No N/V/D or constipation, No heartburn or reflux  GU: No dysuria, frequency or urgency, or incontinence  MUSCULOSKELETAL: No unrelieved bone/joint pain NEUROLOGIC: No  headache, dizziness  PSYCHIATRIC: No overt anxiety or sadness, No behavior issue.                       Physical Exam He is afebrile pulse 120  blood pressure 160/100 respirations 18 O2 saturation is in the 90s on room air GENERAL APPEARANCE: Alert, conversant,  No acute distress but appears somewhat anxious, obese SKIN: No diaphoresis rash HEAD: Normocephalic, atraumatic  EYES: Conjunctiva/lids clear. Pupils round, reactive. EOMs intact.  EARS: External exam WNL, canals clear. Hearing grossly normal.  NOSE: No deformity or discharge.  MOUTH/THROAT: Lips w/o lesions  RESPIRATORY: Breathing is even, unlabored. Lung sounds are clear --shallow air entry  CARDIOVASCULAR: Heart RRR with tachycardia . No peripheral edema.   GASTROINTESTINAL: Abdomen is soft, non-tender, not distended w/ normal bowel sounds. GENITOURINARY: Bladder non tender, not distended  MUSCULOSKELETAL: No abnormal joints or musculature NEUROLOGIC:  Cranial nerves 2-12 grossly intact ;L side weakness left arm is in a sling PSYCHIATRIC: Mood and affect appropriate to situation, no behavioral issues--although appears to have some emotional lability-occasionally will cry-  Patient Active Problem List   Diagnosis Date Noted  . Left-sided neglect 03/09/2014  . Dysphagia, pharyngoesophageal phase 03/02/2014  . Arthralgia of left ankle 02/24/2014  . Hemiplegia affecting left dominant side 02/16/2014  . 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 Benign neoplasm of colon 02/10/2013  . Special screening for malignant neoplasms, colon 02/10/2013  . OSA on CPAP 01/06/2013  . S/P angioplasty with DES to CFX 07/01/11 07/02/2011  . Sleep apnea, "cant afford C-pap" 07/02/2011  . CAD, RCA PCI '09, 10/11, CABG X 4 5/12 06/30/2011  . Hypertension, B/P has been low this admission 06/30/2011  . Hyperlipidemia 06/30/2011  . Morbid obesity 06/30/2011  . NSTEMI, 06/29/11 06/30/2011  . 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 Ischemic cardiomyopathy, EF 35-40 2D May 2012 06/30/2011  . CHF, acute, mild 06/30/2011    CBC    Component Value Date/Time   WBC 5.9 03/11/2014 0612   RBC 4.38 03/11/2014 0612   HGB 11.9* 03/11/2014 0612   HCT 36.9* 03/11/2014 0612   PLT 219 03/11/2014 0612   MCV 84.2 03/11/2014 0612   LYMPHSABS 1.8 02/16/2014 0455   MONOABS 1.0 02/16/2014 0455   EOSABS 0.1 02/16/2014 0455   BASOSABS 0.0 02/16/2014 0455    CMP     Component Value Date/Time   NA 138 03/12/2014 0357   K 4.0 03/12/2014 0357   CL 106 03/12/2014 0357   CO2 20 03/12/2014 0357   GLUCOSE 109* 03/12/2014 0357   BUN 8 03/12/2014 0357   CREATININE 1.04 03/12/2014 0357   CREATININE 1.12 01/04/2013 1206   CALCIUM 8.9 03/12/2014 0357   PROT 7.2 02/16/2014 0455   ALBUMIN 3.2* 02/16/2014 0455   AST 21 02/16/2014 0455   ALT 16 02/16/2014 0455   ALKPHOS 73 02/16/2014 0455   BILITOT 0.7 02/16/2014 0455   GFRNONAA 74* 03/12/2014 0357   GFRAA 86* 03/12/2014 0357    Assessment and Plan  Chest pain with shortness of breath and tachycardia-patient does not appear to be in acute distress however there certainly is concerned with the chest pain tachycardia and somewhat elevated blood pressure-with his extensive cardiac history feel he needs emergent evaluation in the ER-will send to ER-patient appeared to be clinically stable before EMS arrival per serial exams  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 left sided weakness with unsteady gait/fall, left facial droop and slurred speech.  Patient with resultant left sided weakness, lef inattention, moderate dysarthria as well as cognitive deficits. CIR was recommended for follow up therapy.--He isASA and Plavix currently     [phone]

## 2014-04-18 ENCOUNTER — Non-Acute Institutional Stay (SKILLED_NURSING_FACILITY): Payer: 59 | Admitting: Internal Medicine

## 2014-04-18 DIAGNOSIS — E785 Hyperlipidemia, unspecified: Secondary | ICD-10-CM

## 2014-04-18 DIAGNOSIS — Z7902 Long term (current) use of antithrombotics/antiplatelets: Secondary | ICD-10-CM

## 2014-04-18 DIAGNOSIS — G8192 Hemiplegia, unspecified affecting left dominant side: Secondary | ICD-10-CM

## 2014-04-18 DIAGNOSIS — G4733 Obstructive sleep apnea (adult) (pediatric): Secondary | ICD-10-CM

## 2014-04-18 DIAGNOSIS — Z9989 Dependence on other enabling machines and devices: Secondary | ICD-10-CM

## 2014-04-18 DIAGNOSIS — I10 Essential (primary) hypertension: Secondary | ICD-10-CM

## 2014-04-18 DIAGNOSIS — I25810 Atherosclerosis of coronary artery bypass graft(s) without angina pectoris: Secondary | ICD-10-CM

## 2014-04-18 DIAGNOSIS — I2699 Other pulmonary embolism without acute cor pulmonale: Secondary | ICD-10-CM

## 2014-04-18 NOTE — Progress Notes (Signed)
MRN: [id number] Name: Gregory Wiley  Sex: male Age: 64 y.o. DOB: [birth year]  [place] #: [facility] Facility/Room:103 Level Of Care: SNF Provider: Inocencio Homes D Emergency Contacts: Extended Emergency Contact Information Primary Emergency Contact: Merrick,Laura Address: [place], [place] [address] [place] Phone: [phone] Mobile Phone: [phone] Relation: Spouse Secondary Emergency Contact: Chaffin,Danielle Address: [place] Mobile Phone: [phone] Relation: Daughter  Code Status: FULL  Allergies: Diphenhydramine hcl  Chief Complaint  Patient presents with  . New Admit To SNF    HPI: Patient is 64 y.o. male who is admitted to SNF to continue his PT/PT which was interupted by a B PE.  Past Medical History  Diagnosis Date  . Coronary artery disease     stent, then CABG 07/20/10  . MI (myocardial infarction)     NSTEMI- last cath 06/2011-Stent to LCX-DES, last nuc 07/30/11 low risk  . Diabetes mellitus     hx of, diet controlled  . Hypertension   . Hyperlipidemia   . Obesity (BMI 30-39.9)   . OSA on CPAP     since July 2013  . H/O cardiomyopathy     ischemic, now last echo 07/30/11, EF 38%W  . Diverticulosis   . Internal hemorrhoids   . Tubular adenoma of colon   . Plantar fasciitis of left foot   . Stroke     Past Surgical History  Procedure Laterality Date  . Coronary artery bypass graft  07/20/2010     LIMA-LAD; VG-ACUTE MARG of RCA; Seq VG-distal RCA & then pda  . Coronary angioplasty with stent placement  07/01/2011    DES-Resolute to native LCX  . Coronary angioplasty with stent placement  12/01/2007    COMPLEX 5 LESION PCI INCLUDING CUTTING BALLOON AND 4 CYPHER DESs  . Hernia repair  2006  . Knee surgery Right   . Hand surgery      to take glass out  . Colonoscopy N/A 02/10/2013    Procedure: COLONOSCOPY;  Surgeon: Ladene Artist, MD;  Location: [facility]  ENDOSCOPY;  Service: Endoscopy;  Laterality: N/A;  . Retinal detachment surgery    . Loop recorder implant  02/15/2014    MDT LINQ implanted by Dr Rayann Heman for cryptogenic stroke  . Tee without cardioversion N/A 02/15/2014    Procedure: TRANSESOPHAGEAL ECHOCARDIOGRAM (TEE);  Surgeon: Josue Hector, MD;  Location: [facility] ENDOSCOPY;  Service: Cardiovascular;  Laterality: N/A;  . Left heart catheterization with coronary/graft angiogram  07/01/2011    Procedure: LEFT HEART CATHETERIZATION WITH Beatrix Fetters;  Surgeon: Sanda Klein, MD;  Location: [facility] CATH LAB;  Service: Cardiovascular;;  . Percutaneous coronary stent intervention (pci-s) Right 07/01/2011    Procedure: PERCUTANEOUS CORONARY STENT INTERVENTION (PCI-S);  Surgeon: Sanda Klein, MD;  Location: [facility] CATH LAB;  Service: Cardiovascular;  Laterality: Right;      Medication List       This list is accurate as of: 04/18/14 11:59 PM.  Always use your most recent med list.               acetaminophen 325 MG tablet  Commonly known as:  TYLENOL  Take 2 tablets (650 mg total) by mouth every 6 (six) hours as needed for mild pain.     ALPRAZolam 0.25 MG tablet  Commonly known as:  XANAX  Take 0.5 tablets (0.125  mg total) by mouth every 8 (eight) hours as needed for anxiety.     aspirin 81 MG EC tablet  Take 1 tablet (81 mg total) by mouth daily.     atorvastatin 80 MG tablet  Commonly known as:  LIPITOR  Take 1 tablet (80 mg total) by mouth daily at 6 PM.     carvedilol 3.125 MG tablet  Commonly known as:  COREG  Take 3.125 mg by mouth 2 (two) times daily with a meal.     colchicine 0.6 MG tablet  Take 0.6 mg by mouth as needed (for gout).     fluticasone 50 MCG/ACT nasal spray  Commonly known as:  FLONASE  Place 2 sprays into both nostrils daily.     furosemide 40 MG tablet  Commonly known as:  LASIX  Take 40 mg by mouth as needed for edema.     gabapentin 100 MG capsule  Commonly known as:  NEURONTIN  Take 1  capsule (100 mg total) by mouth 3 (three) times daily.     meclizine 25 MG tablet  Commonly known as:  ANTIVERT  Take 1 tablet (25 mg total) by mouth 3 (three) times daily as needed for dizziness.     methylphenidate 5 MG tablet  Commonly known as:  RITALIN  Take one tablet by mouth twice daily with breakfast and lunch     MUSCLE RUB 10-15 % Crea  Apply 1 application topically 2 (two) times daily. To left ankle and left shoulder     nitroGLYCERIN 0.4 MG SL tablet  Commonly known as:  NITROSTAT  Place 0.4 mg under the tongue every 5 (five) minutes as needed. For chest pain     OVER THE COUNTER MEDICATION  Inhale 1 application into the lungs at bedtime. C-PAP     pantoprazole 40 MG tablet  Commonly known as:  PROTONIX  Take 1 tablet (40 mg total) by mouth 2 (two) times daily.     protein supplement Powd  Take 1 scoop by mouth 3 (three) times daily with meals.     ranolazine 500 MG 12 hr tablet  Commonly known as:  RANEXA  Take 500 mg by mouth 2 (two) times daily.     Rivaroxaban 15 MG Tabs tablet  Commonly known as:  XARELTO  Take 1 tablet (15 mg total) by mouth 2 (two) times daily with a meal.     rivaroxaban 20 MG Tabs tablet  Commonly known as:  XARELTO  Take 1 tablet (20 mg total) by mouth daily with supper.  Start taking on:  05/04/2014     Vitamin D (Ergocalciferol) 50000 UNITS Caps capsule  Commonly known as:  DRISDOL  Take 50,000 Units by mouth every 7 (seven) days. Take on Mondays        No orders of the defined types were placed in this encounter.    Immunization History  Administered Date(s) Administered  . Influenza,inj,Quad PF,36+ Mos 04/13/2014    History  Substance Use Topics  . Smoking status: Former Research scientist (life sciences)  . Smokeless tobacco: Never Used  . Alcohol Use: Yes     Comment: 1-2 times per year    Family history is noncontributory    Review of Systems  DATA OBTAINED: from patient, nurse, medical record, GENERAL:  no fevers, fatigue,  appetite changes SKIN: No itching, rash or wounds EYES: No eye pain, redness, discharge EARS: No earache, tinnitus, change in hearing NOSE: No congestion, drainage or bleeding  MOUTH/THROAT: No mouth or  tooth pain, No sore throat RESPIRATORY: No cough, wheezing, SOB CARDIAC: No chest pain, palpitations, lower extremity edema  GI: No abdominal pain, No N/V/D or constipation, No heartburn or reflux  GU: No dysuria, frequency or urgency, or incontinence  MUSCULOSKELETAL: No unrelieved bone/joint pain NEUROLOGIC: No headache, dizziness  PSYCHIATRIC: No overt anxiety or sadness, No behavior issue.   Filed Vitals:   04/18/14 1442  BP: 120/61  Pulse: 81  Temp: 97.2 F (36.2 C)  Resp: 18    Physical Exam  GENERAL APPEARANCE: Alert, conversant,  No acute distress, obese  SKIN: No diaphoresis rash HEAD: Normocephalic, atraumatic  EYES: Conjunctiva/lids clear. Pupils round, reactive. EOMs intact.  EARS: External exam WNL, canals clear. Hearing grossly normal.  NOSE: No deformity or discharge.  MOUTH/THROAT: Lips w/o lesions  RESPIRATORY: Breathing is even, unlabored. Lung sounds are clear   CARDIOVASCULAR: Heart RRR no murmurs, rubs or gallops. No peripheral edema.   GASTROINTESTINAL: Abdomen is soft, non-tender, not distended w/ normal bowel sounds. GENITOURINARY: Bladder non tender, not distended  MUSCULOSKELETAL: No abnormal joints or musculature NEUROLOGIC:  Cranial nerves 2-12 grossly intact; L side weakness  PSYCHIATRIC: Mood and affect appropriate to situation, no behavioral issues  Patient Active Problem List   Diagnosis Date Noted  . Antiplatelet or antithrombotic long-term use 04/22/2014  . Chest pain 04/17/2014  . SOB (shortness of breath) 04/17/2014  . Bilateral pulmonary embolism 04/11/2014  . Left-sided neglect 03/09/2014  . Dysphagia, pharyngoesophageal phase 03/02/2014  . Arthralgia of left ankle 02/24/2014  . Hemiplegia affecting left dominant side 02/16/2014   . 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 Benign neoplasm of colon 02/10/2013  . Special screening for malignant neoplasms, colon 02/10/2013  . OSA on CPAP 01/06/2013  . S/P angioplasty with DES to CFX 07/01/11 07/02/2011  . Sleep apnea, "cant afford C-pap" 07/02/2011  . CAD, RCA PCI '09, 10/11, CABG X 4 5/12 06/30/2011  . Hypertension, B/P has been low this admission 06/30/2011  . Hyperlipidemia 06/30/2011  . Morbid obesity 06/30/2011  . NSTEMI, 06/29/11 06/30/2011  . 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 Ischemic cardiomyopathy, EF 35-40 2D May 2012 06/30/2011  . CHF, acute, mild 06/30/2011    CBC    Component Value Date/Time   WBC 7.9 04/13/2014 0430   RBC 4.93 04/13/2014 0430   HGB 13.2 04/13/2014 0430   HCT 40.9 04/13/2014 0430   PLT 178 04/13/2014 0430   MCV 83.0 04/13/2014 0430   LYMPHSABS 1.8 02/16/2014 0455   MONOABS 1.0 02/16/2014 0455   EOSABS 0.1 02/16/2014 0455   BASOSABS 0.0 02/16/2014 0455    CMP     Component Value Date/Time   NA 141 04/12/2014 0451   K 3.4* 04/12/2014 0451   CL 107 04/12/2014 0451   CO2 25 04/12/2014 0451   GLUCOSE 106* 04/12/2014 0451   BUN 10 04/12/2014 0451   CREATININE 1.01 04/12/2014 0451   CREATININE 1.12 01/04/2013 1206   CALCIUM 8.9 04/12/2014 0451   PROT 6.8 04/12/2014 0451   ALBUMIN 3.2* 04/12/2014 0451   AST 27 04/12/2014 0451   ALT 43 04/12/2014 0451   ALKPHOS 73 04/12/2014 0451   BILITOT 0.7 04/12/2014 0451   GFRNONAA 77* 04/12/2014 0451   GFRAA 89* 04/12/2014 0451    Assessment and Plan  Bilateral pulmonary embolism Patient presenting with chest pain and dyspnea. No prior thrombosis or family history of thrombophilia. Initial Wells score was 4.5 placing patient in moderate risk group. Patient has  had no recent trauma or surgery, stasis seems to be the only risk factor s/p CVA. CTA with large bilateral pulmonary emboli with associated right heart strain. D-dimer  6.85. Hemodynamically stable. Initially managed with full-dose IV heparin. CCM consulted and found that patient not a candidate for thrombolysis. Heparin transitioned to Xarelto BID on 1/27. Patient did have new O2 requirement of 3L [place]. LE dopplers showed DVTs in L gastrocnemius, soleal veins and PT veins. Echo showed EF 25-30% (previously 45% in 01/2014), likely from heart strain with large PEs. Home antihypertensives held during admission.- after 21 d, transition to Xarelto 20mg  once daily    Antiplatelet or antithrombotic long-term use Troponins mildly elevated and peaked at 0.31 and then downtrended. Likely some demand ischemia in setting of large PEs. EKG nonischemic. Discussed antiplatelet therapy (previously on aspirin and plavix s/p CVA in 01/2014) with neurology on 1/26. Per neurology, all right for patient to be off all antiplatelets while anticoagulated. Plavix discontinued, but continued aspirin for CAD and h/o CABG    Hypertension, B/P has been low this admission restarting home Coreg at discharge, restart Imdur as BP allows; BP still lowish, will not start Imdur yet, pt needs to be up got OT/PT    Hemiplegia affecting left dominant side Pt was receiving OT/PT when had PE's, returns to a SNF to continue rehab     Hennie Duos, MD

## 2014-04-19 NOTE — Progress Notes (Signed)
Loop recorder 

## 2014-04-21 ENCOUNTER — Encounter: Payer: Self-pay | Admitting: Internal Medicine

## 2014-04-21 ENCOUNTER — Non-Acute Institutional Stay (SKILLED_NURSING_FACILITY): Payer: 59 | Admitting: Internal Medicine

## 2014-04-21 DIAGNOSIS — I63411 Cerebral infarction due to embolism of right middle cerebral artery: Secondary | ICD-10-CM

## 2014-04-21 DIAGNOSIS — G8192 Hemiplegia, unspecified affecting left dominant side: Secondary | ICD-10-CM

## 2014-04-21 DIAGNOSIS — I2510 Atherosclerotic heart disease of native coronary artery without angina pectoris: Secondary | ICD-10-CM

## 2014-04-21 DIAGNOSIS — I2699 Other pulmonary embolism without acute cor pulmonale: Secondary | ICD-10-CM

## 2014-04-21 NOTE — Progress Notes (Signed)
Patient ID: Gregory Wiley, male   DOB: 1950/04/07, 64 y.o.   MRN: [id number]   this is a discharge note.  Level care skilled.  [facility].   Chief Complaint  Patient presents with  .  discharge note     HPI: Patient is a pleasant 64 year old male with a history CVA-his rehabilitation was complicated with readmission for bilateral pulmonary embolisms-he is on Zaroxolyn this appears to be stable he will be going home or he has strong family support he will need continued PT and OT for strengthening as well as CNA support for help with activities of daily living and nursing support   Past Medical History  Diagnosis Date  . Coronary artery disease     stent, then CABG 07/20/10  . MI (myocardial infarction)     NSTEMI- last cath 06/2011-Stent to LCX-DES, last nuc 07/30/11 low risk  . Diabetes mellitus     hx of, diet controlled  . Hypertension   . Hyperlipidemia   . Obesity (BMI 30-39.9)   . OSA on CPAP     since July 2013  . H/O cardiomyopathy     ischemic, now last echo 07/30/11, EF 38%W  . Diverticulosis   . Internal hemorrhoids   . Tubular adenoma of colon   . Plantar fasciitis of left foot   . Stroke     Past Surgical History  Procedure Laterality Date  . Coronary artery bypass graft  07/20/2010     LIMA-LAD; VG-ACUTE MARG of RCA; Seq VG-distal RCA & then pda  . Coronary angioplasty with stent placement  07/01/2011    DES-Resolute to native LCX  . Coronary angioplasty with stent placement  12/01/2007    COMPLEX 5 LESION PCI INCLUDING CUTTING BALLOON AND 4 CYPHER DESs  . Hernia repair  2006  . Knee surgery Right   . Hand surgery      to take glass out  . Colonoscopy N/A 02/10/2013    Procedure: COLONOSCOPY;  Surgeon: Ladene Artist, MD;  Location: [facility] ENDOSCOPY;  Service: Endoscopy;  Laterality: N/A;  . Retinal detachment surgery    . Loop recorder implant  02/15/2014    MDT LINQ implanted by Dr Rayann Heman for cryptogenic stroke  . Tee without cardioversion N/A  02/15/2014    Procedure: TRANSESOPHAGEAL ECHOCARDIOGRAM (TEE);  Surgeon: Josue Hector, MD;  Location: [facility] ENDOSCOPY;  Service: Cardiovascular;  Laterality: N/A;  . Left heart catheterization with coronary/graft angiogram  07/01/2011    Procedure: LEFT HEART CATHETERIZATION WITH Beatrix Fetters;  Surgeon: Sanda Klein, MD;  Location: [facility] CATH LAB;  Service: Cardiovascular;;  . Percutaneous coronary stent intervention (pci-s) Right 07/01/2011    Procedure: PERCUTANEOUS CORONARY STENT INTERVENTION (PCI-S);  Surgeon: Sanda Klein, MD;  Location: [facility] CATH LAB;  Service: Cardiovascular;  Laterality: Right;      Medication List                      acetaminophen 325 MG tablet  Commonly known as:  TYLENOL  Take 2 tablets (650 mg total) by mouth every 6 (six) hours as needed for mild pain.     ALPRAZolam 0.25 MG tablet  Commonly known as:  XANAX  Take 0.5 tablets (0.125 mg total) by mouth every 8 (eight) hours as needed for anxiety.     aspirin 81 MG EC tablet  Take 1 tablet (81 mg total) by mouth daily.     atorvastatin 80 MG tablet  Commonly known as:  LIPITOR  Take 1 tablet (80 mg total) by mouth daily at 6 PM.     carvedilol 3.125 MG tablet  Commonly known as:  COREG  Take 3.125 mg by mouth 2 (two) times daily with a meal.     colchicine 0.6 MG tablet  Take 0.6 mg by mouth as needed (for gout).     fluticasone 50 MCG/ACT nasal spray  Commonly known as:  FLONASE  Place 2 sprays into both nostrils daily.     furosemide 40 MG tablet  Commonly known as:  LASIX  Take 40 mg by mouth as needed for edema.     gabapentin 100 MG capsule  Commonly known as:  NEURONTIN  Take 1 capsule (100 mg total) by mouth 3 (three) times daily.     meclizine 25 MG tablet  Commonly known as:  ANTIVERT  Take 1 tablet (25 mg total) by mouth 3 (three) times daily as needed for dizziness.     methylphenidate 5 MG tablet  Commonly known as:  RITALIN  Take one tablet by mouth twice daily  with breakfast and lunch     MUSCLE RUB 10-15 % Crea  Apply 1 application topically 2 (two) times daily. To left ankle and left shoulder     nitroGLYCERIN 0.4 MG SL tablet  Commonly known as:  NITROSTAT  Place 0.4 mg under the tongue every 5 (five) minutes as needed. For chest pain     OVER THE COUNTER MEDICATION  Inhale 1 application into the lungs at bedtime. C-PAP     pantoprazole 40 MG tablet  Commonly known as:  PROTONIX  Take 1 tablet (40 mg total) by mouth 2 (two) times daily.     protein supplement Powd  Take 1 scoop by mouth 3 (three) times daily with meals.     ranolazine 500 MG 12 hr tablet  Commonly known as:  RANEXA  Take 500 mg by mouth 2 (two) times daily.     Rivaroxaban 15 MG Tabs tablet  Commonly known as:  XARELTO  Take 1 tablet (15 mg total) by mouth 2 (two) times daily with a meal.     rivaroxaban 20 MG Tabs tablet  Commonly known as:  XARELTO  Take 1 tablet (20 mg total) by mouth daily with supper.  Start taking on:  05/04/2014     Vitamin D (Ergocalciferol) 50000 UNITS Caps capsule  Commonly known as:  DRISDOL  Take 50,000 Units by mouth every 7 (seven) days. Take on Mondays        No orders of the defined types were placed in this encounter.    Immunization History  Administered Date(s) Administered  . Influenza,inj,Quad PF,36+ Mos 04/13/2014    History  Substance Use Topics  . Smoking status: Former Research scientist (life sciences)  . Smokeless tobacco: Never Used  . Alcohol Use: Yes     Comment: 1-2 times per year    Family history is noncontributory    Review of Systems  DATA OBTAINED: from patient, nurse, medical record, GENERAL:  no fevers, fatigue, appetite changes SKIN: No itching, rash or wounds EYES: No eye pain, redness, discharge EARS: No earache, tinnitus, change in hearing NOSE: No congestion, drainage or bleeding  MOUTH/THROAT: No mouth or tooth pain, No sore throat RESPIRATORY: No cough, wheezing, SOB CARDIAC: No chest pain, palpitations,  lower extremity edema  GI: No abdominal pain, No N/V/D or constipation, No heartburn or reflux  GU: No dysuria, frequency or urgency, or incontinence  MUSCULOSKELETAL: No unrelieved bone/joint pain  NEUROLOGIC: No headache, dizziness  PSYCHIATRIC: No overt anxiety or sadness, No behavior issue.                       Physical Exam Temperature 97.0 pulse 66 respirations 20 blood pressure 107/68 GENERAL APPEARANCE: Alert, conversant,  No acute distress, obese  SKIN: No diaphoresis rash HEAD: Normocephalic, atraumatic  EYES: Conjunctiva/lids clear. Pupils round, reactive. EOMs intact.  EARS: External exam WNL, canals clear. Hearing grossly normal.  NOSE: No deformity or discharge.  MOUTH/THROAT: Lips w/o lesions  RESPIRATORY: Breathing is even, unlabored. Lung sounds are clear   CARDIOVASCULAR: Heart RRR no murmurs, rubs or gallops. No peripheral edema.   GASTROINTESTINAL: Abdomen is soft, non-tender, not distended w/ normal bowel sounds. GENITOURINARY: Bladder non tender, not distended  MUSCULOSKELETAL: No abnormal joints or musculature--left-sided hemiparalysis which is baseline NEUROLOGIC:  Cranial nerves 2-12 grossly intact; L side weakness --this is baseline PSYCHIATRIC: Mood and affect appropriate to situation, no behavioral issues  Patient Active Problem List   Diagnosis Date Noted  . Antiplatelet or antithrombotic long-term use 04/22/2014  . Chest pain 04/17/2014  . SOB (shortness of breath) 04/17/2014  . Bilateral pulmonary embolism 04/11/2014  . Left-sided neglect 03/09/2014  . Dysphagia, pharyngoesophageal phase 03/02/2014  . Arthralgia of left ankle 02/24/2014  . Hemiplegia affecting left dominant side 02/16/2014  . 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 Benign neoplasm of colon 02/10/2013  . Special screening for malignant neoplasms, colon 02/10/2013  . OSA on CPAP 01/06/2013  . S/P angioplasty with DES to CFX 07/01/11 07/02/2011  .  Sleep apnea, "cant afford C-pap" 07/02/2011  . CAD, RCA PCI '09, 10/11, CABG X 4 5/12 06/30/2011  . Hypertension, B/P has been low this admission 06/30/2011  . Hyperlipidemia 06/30/2011  . Morbid obesity 06/30/2011  . NSTEMI, 06/29/11 06/30/2011  . 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 Ischemic cardiomyopathy, EF 35-40 2D May 2012 06/30/2011  . CHF, acute, mild 06/30/2011    CBC    Component Value Date/Time   WBC 7.9 04/13/2014 0430   RBC 4.93 04/13/2014 0430   HGB 13.2 04/13/2014 0430   HCT 40.9 04/13/2014 0430   PLT 178 04/13/2014 0430   MCV 83.0 04/13/2014 0430   LYMPHSABS 1.8 02/16/2014 0455   MONOABS 1.0 02/16/2014 0455   EOSABS 0.1 02/16/2014 0455   BASOSABS 0.0 02/16/2014 0455    CMP     Component Value Date/Time   NA 141 04/12/2014 0451   K 3.4* 04/12/2014 0451   CL 107 04/12/2014 0451   CO2 25 04/12/2014 0451   GLUCOSE 106* 04/12/2014 0451   BUN 10 04/12/2014 0451   CREATININE 1.01 04/12/2014 0451   CREATININE 1.12 01/04/2013 1206   CALCIUM 8.9 04/12/2014 0451   PROT 6.8 04/12/2014 0451   ALBUMIN 3.2* 04/12/2014 0451   AST 27 04/12/2014 0451   ALT 43 04/12/2014 0451   ALKPHOS 73 04/12/2014 0451   BILITOT 0.7 04/12/2014 0451   GFRNONAA 77* 04/12/2014 0451   GFRAA 89* 04/12/2014 0451    Assessment and Plan  Bilateral pulmonary embolism Patient presenting with chest pain and dyspnea. No prior thrombosis or family history of thrombophilia. Initial Wells score was 4.5 placing patient in moderate risk group. Patient has had no recent trauma or surgery, stasis seems to be the only risk factor s/p CVA. CTA with large bilateral pulmonary emboli with associated right heart strain. D-dimer 6.85. Hemodynamically stable. Initially managed with full-dose IV  heparin. CCM consulted and found that patient not a candidate for thrombolysis. Heparin transitioned to Xarelto BID on 1/27. Patient did have new O2 requirement of 3L [place]. LE dopplers showed  DVTs in L gastrocnemius, soleal veins and PT veins. Echo showed EF 25-30% (previously 45% in 01/2014), likely from heart strain with large PEs. Home antihypertensives held during admission.- after 21 d, transition to Xarelto 20mg  once daily--Lahey make Lahey is stable with no complaints of chest pain or shortness of breath is looking forward to going home    Antiplatelet or antithrombotic long-term use Troponins mildly elevated and peaked at 0.31 and then downtrended. Likely some demand ischemia in setting of large PEs. EKG nonischemic. Discussed antiplatelet therapy (previously on aspirin and plavix s/p CVA in 01/2014) with neurology on 1/26. Per neurology, all right for patient to be off all antiplatelets while anticoagulated. Plavix discontinued, but continued aspirin for CAD and h/o CABG  's continues to be stable  Hypertension, B/P has been low this admission restarting home Coreg at discharge, restart Imdur as BP allows;-blood pressures recently 107/68-132/85    Hemiplegia affecting left dominant side He will need home health support and CNA support to help with activities of daily living as well as PT and OT for strengthening-also will need an AFO secondary to dorsiflexion weakness and ankle instability with history of CVA  Of note Will update CBC and BMP before discharge notify primary care provider of results.  [id number] note greater than 30 minutes spent on this discharge summary-prescriptions have been written

## 2014-04-22 ENCOUNTER — Encounter: Payer: Self-pay | Admitting: Internal Medicine

## 2014-04-22 DIAGNOSIS — Z7902 Long term (current) use of antithrombotics/antiplatelets: Secondary | ICD-10-CM | POA: Insufficient documentation

## 2014-04-22 NOTE — Assessment & Plan Note (Addendum)
Patient presenting with chest pain and dyspnea. No prior thrombosis or family history of thrombophilia. Initial Wells score was 4.5 placing patient in moderate risk group. Patient has had no recent trauma or surgery, stasis seems to be the only risk factor s/p CVA. CTA with large bilateral pulmonary emboli with associated right heart strain. D-dimer 6.85. Hemodynamically stable. Initially managed with full-dose IV heparin. CCM consulted and found that patient not a candidate for thrombolysis. Heparin transitioned to Xarelto BID on 1/27. Patient did have new O2 requirement of 3L [place]. LE dopplers showed DVTs in L gastrocnemius, soleal veins and PT veins. Echo showed EF 25-30% (previously 45% in 01/2014), likely from heart strain with large PEs. Home antihypertensives held during admission.- after 21 d, transition to Xarelto 20mg  once daily

## 2014-04-22 NOTE — Assessment & Plan Note (Signed)
Pt has been using CPAP here at [facility]

## 2014-04-22 NOTE — Assessment & Plan Note (Addendum)
Troponins mildly elevated and peaked at 0.31 and then downtrended. Likely some demand ischemia in setting of large PEs. EKG nonischemic. Discussed antiplatelet therapy (previously on aspirin and plavix s/p CVA in 01/2014) with neurology on 1/26. Per neurology, all right for patient to be off all antiplatelets while anticoagulated. Plavix discontinued, but continued aspirin for CAD and h/o CABG

## 2014-04-22 NOTE — Assessment & Plan Note (Signed)
Pt is off plavix while on xarelto and off Imdur while BP remains soft; Still on ASA, Bblocker, statin and prn NTG

## 2014-04-22 NOTE — Assessment & Plan Note (Signed)
restarting home Coreg at discharge, restart Imdur as BP allows; BP still lowish, will not start Imdur yet, pt needs to be up got OT/PT

## 2014-04-22 NOTE — Assessment & Plan Note (Signed)
No change, continue Lipitor 80 mg; numbers do not reflect dose as pt was non compliant;he has been compliant as of hosp for CVA, then SNF, then hosp fopr PE and back to SNF again, approx 4 weeks.

## 2014-04-22 NOTE — Assessment & Plan Note (Signed)
Pt was receiving OT/PT when had PE's, returns to a SNF to continue rehab

## 2014-04-26 ENCOUNTER — Encounter: Payer: Self-pay | Admitting: Neurology

## 2014-04-26 ENCOUNTER — Ambulatory Visit (INDEPENDENT_AMBULATORY_CARE_PROVIDER_SITE_OTHER): Payer: 59 | Admitting: Neurology

## 2014-04-26 ENCOUNTER — Other Ambulatory Visit: Payer: Self-pay | Admitting: *Deleted

## 2014-04-26 DIAGNOSIS — Z9989 Dependence on other enabling machines and devices: Secondary | ICD-10-CM

## 2014-04-26 DIAGNOSIS — R414 Neurologic neglect syndrome: Secondary | ICD-10-CM

## 2014-04-26 DIAGNOSIS — I255 Ischemic cardiomyopathy: Secondary | ICD-10-CM

## 2014-04-26 DIAGNOSIS — I69354 Hemiplegia and hemiparesis following cerebral infarction affecting left non-dominant side: Secondary | ICD-10-CM

## 2014-04-26 DIAGNOSIS — I63411 Cerebral infarction due to embolism of right middle cerebral artery: Secondary | ICD-10-CM

## 2014-04-26 DIAGNOSIS — I10 Essential (primary) hypertension: Secondary | ICD-10-CM

## 2014-04-26 DIAGNOSIS — Z7902 Long term (current) use of antithrombotics/antiplatelets: Secondary | ICD-10-CM

## 2014-04-26 DIAGNOSIS — G4733 Obstructive sleep apnea (adult) (pediatric): Secondary | ICD-10-CM

## 2014-04-26 DIAGNOSIS — G8192 Hemiplegia, unspecified affecting left dominant side: Secondary | ICD-10-CM

## 2014-04-26 MED ORDER — BACLOFEN 10 MG PO TABS: 10.0000 mg | ORAL_TABLET | Freq: Four times a day (QID) | ORAL | Status: DC

## 2014-04-26 MED ORDER — METHYLPHENIDATE HCL 10 MG PO TABS
10.0000 mg | ORAL_TABLET | Freq: Two times a day (BID) | ORAL | Status: DC
Start: 2014-04-26 — End: 2014-05-12

## 2014-04-26 MED ORDER — RIVAROXABAN 20 MG PO TABS: 20.0000 mg | ORAL_TABLET | Freq: Every day | ORAL | Status: DC

## 2014-04-26 NOTE — Progress Notes (Signed)
[facility]  PATIENT: Gregory Wiley Nickey DOB: [birth year]  REFERRING CLINICIAN: james Little HISTORY FROM: patient REASON FOR VISIT: stroke   HISTORICAL  CHIEF COMPLAINT:  Chief Complaint  Patient presents with  . Cerebrovascular Accident    Sts. he had a stroke the day after Thanksgiving 2015.  Sts. he got out of bed that morning and immed. fell because of left sided weakness.  He was treated at [facility] was on Plavix at the time of the stroke due to hx. of mi.  While in the hospital, Plavix was d/c ad he was treated with IV Heparin.  Upon d/c form the hospital, he was started on Xarelto, which he continues.    HISTORY OF PRESENT ILLNESS:  Gregory Wiley is a 64 year old man with a history of coronary artery disease, hypertension and obstructive sleep apnea who presented with left-sided weakness and an unsteady gait on 02/12/2014.  He recalls going to bed at his normal time on 02/12/2015. When he woke up, about 7 hours later, he tried to get out of bed but was unable to stand due to left-sided weakness. He was taken to the emergency room. In the emergency room, he had an MRI of the brain showing an acute right MCA territory stroke that mostly involved the right temporal lobe with extension to the insula and the basal ganglia on the right. MR angiogram showed an occlusion of the right M1 segment  Of note, he had been on aspirin and Plavix that had been discontinued a few weeks earlier due to a bleeding ulcer. In the hospital, family reported to the neurohospitalist noncompliance with medications and CPAP. He was admitted and had a multiple tests performed. Ultrasound of the carotid arteries showed less than 39% stenosis. A transesophageal echocardiogram showed a LVEF equals 45%, there was septal hypokinesia, no PFO or ASD was identified.  At the hospital, he was started back on Plavix.   After the acute hospital stay, he was transferred to an inpatient rehabilitation  facility. He had improvement in activity tolerance, balance and postural control but he required max assist for bathing and mild to moderate assistance for other tasks. He was discharged to [facility] skilled nursing facility.   In rehabilitation to continue to improve and got to a point where he could get out of chair independently and walk at least 20 feet with a quad cane. A couple weeks ago, while at the [facility], he had shortness of breath and he was taken to [facility]. In the ER, a CT angiogram was performed and that showed that he had pulmonary embolisms involving both pulmonary arteries.  He was initially placed on heparin and then started on Xarelto. He remains on Xarelto and has tolerated it well without any bleeds.  He was able to return home last Friday.      Currently, he continues to have a lot of weakness on the left side. His leg is still extremely weak and he cannot raise the knee any. He is starting to get some strength back in the left arm. However, he also has a lot of spasticity in the left arm and leg.   He also has some numbness on the left side and neglect. The numbness is most noticeable in the left hand and foot. He also notes decrease ability to see things on the left side.     He was able to advance his diet and is now on regular food.   He  has not had bladder issues from the stroke.  However, he has hesitancy that he did not recall having before the stroke.   He had prostatitis in the past.    He has obstructive sleep apnea but is not always compliant with his CPAP. We discussed the importance of doing so as it can help to reduce the risk of stroke. He has excessive daytime sleepiness despite taking methylphenidate 5 mg by mouth twice a day since being in rehabilitation.     He feels he is now thinking back to his baseline but there was more confusion during the first few weeks of the stroke. His wife feels that he is not quite at baseline.   He had a  neglect in the hospital but feels that he is able to pay more attention to the left side now.  I personally reviewed imaging and other studies from the hospital. The MRI images of the brain performed at [facility] showed an acute stroke involving the left MCA distribution. The MR angiogram images showed that he had no flow after the M1 segment of the middle cerebral artery. An embolus was suspected. I concur with the neuroradiology reports.    REVIEW OF SYSTEMS:  Constitutional: No fevers, chills, sweats, or change in appetite.  He sleeps poorly.    He is very tired Eyes: No visual changes, double vision, eye pain Ear, nose and throat: No hearing loss, ear pain, nasal congestion, sore throat Cardiovascular: No chest pain, palpitations Respiratory:  No shortness of breath at rest or with exertion.   No wheezes GastrointestinaI: No nausea, vomiting, diarrhea, abdominal pain, fecal incontinence Genitourinary:  No dysuria, urinary retention or frequency.  No nocturia. Musculoskeletal:  No neck pain, back pain but he has shoulder and hip pain Integumentary: No rash, pruritus, skin lesions Neurological: as above Psychiatric: No depression at this time.  No anxiety Endocrine: No palpitations, diaphoresis, change in appetite, change in weigh.  He notes feeling cold and increased thirst Hematologic/Lymphatic:  No anemia, purpura, petechiae. Allergic/Immunologic: No itchy/runny eyes, nasal congestion, recent allergic reactions, rashes  ALLERGIES: Allergies  Allergen Reactions  . Diphenhydramine Hcl Anaphylaxis    HOME MEDICATIONS: Outpatient Prescriptions Prior to Visit  Medication Sig Dispense Refill  . acetaminophen (TYLENOL) 325 MG tablet Take 2 tablets (650 mg total) by mouth every 6 (six) hours as needed for mild pain.    Marland Kitchen ALPRAZolam (XANAX) 0.25 MG tablet Take 0.5 tablets (0.125 mg total) by mouth every 8 (eight) hours as needed for anxiety. 45 tablet 5  . aspirin EC 81 MG EC  tablet Take 1 tablet (81 mg total) by mouth daily.    Marland Kitchen atorvastatin (LIPITOR) 80 MG tablet Take 1 tablet (80 mg total) by mouth daily at 6 PM.    . carvedilol (COREG) 3.125 MG tablet Take 3.125 mg by mouth 2 (two) times daily with a meal.    . colchicine 0.6 MG tablet Take 0.6 mg by mouth as needed (for gout).   0  . fluticasone (FLONASE) 50 MCG/ACT nasal spray Place 2 sprays into both nostrils daily.  2  . furosemide (LASIX) 40 MG tablet Take 40 mg by mouth as needed for edema.    . gabapentin (NEURONTIN) 100 MG capsule Take 1 capsule (100 mg total) by mouth 3 (three) times daily. (Patient taking differently: Take 100 mg by mouth as needed (for neuropathy). )    . Menthol-Methyl Salicylate (MUSCLE RUB) 10-15 % CREA Apply 1 application topically 2 (two)  times daily. To left ankle and left shoulder  0  . methylphenidate (RITALIN) 5 MG tablet Take one tablet by mouth twice daily with breakfast and lunch 60 tablet 0  . nitroGLYCERIN (NITROSTAT) 0.4 MG SL tablet Place 0.4 mg under the tongue every 5 (five) minutes as needed. For chest pain    . OVER THE COUNTER MEDICATION Inhale 1 application into the lungs at bedtime. C-PAP    . pantoprazole (PROTONIX) 40 MG tablet Take 1 tablet (40 mg total) by mouth 2 (two) times daily.    . protein supplement (RESOURCE BENEPROTEIN) POWD Take 1 scoop by mouth 3 (three) times daily with meals.    . ranolazine (RANEXA) 500 MG 12 hr tablet Take 500 mg by mouth 2 (two) times daily.    . Rivaroxaban (XARELTO) 15 MG TABS tablet Take 1 tablet (15 mg total) by mouth 2 (two) times daily with a meal. 30 tablet 0  . [START ON 05/04/2014] rivaroxaban (XARELTO) 20 MG TABS tablet Take 1 tablet (20 mg total) by mouth daily with supper. 30 tablet 0  . Vitamin D, Ergocalciferol, (DRISDOL) 50000 UNITS CAPS capsule Take 50,000 Units by mouth every 7 (seven) days. Take on Mondays    . meclizine (ANTIVERT) 25 MG tablet Take 1 tablet (25 mg total) by mouth 3 (three) times daily as needed  for dizziness. (Patient not taking: Reported on 04/26/2014) 30 tablet 0   No facility-administered medications prior to visit.    PAST MEDICAL HISTORY: Past Medical History  Diagnosis Date  . Coronary artery disease     stent, then CABG 07/20/10  . MI (myocardial infarction)     NSTEMI- last cath 06/2011-Stent to LCX-DES, last nuc 07/30/11 low risk  . Diabetes mellitus     hx of, diet controlled  . Hypertension   . Hyperlipidemia   . Obesity (BMI 30-39.9)   . OSA on CPAP     since July 2013  . H/O cardiomyopathy     ischemic, now last echo 07/30/11, EF 38%W  . Diverticulosis   . Internal hemorrhoids   . Tubular adenoma of colon   . Plantar fasciitis of left foot   . Stroke     PAST SURGICAL HISTORY: Past Surgical History  Procedure Laterality Date  . Coronary artery bypass graft  07/20/2010     LIMA-LAD; VG-ACUTE MARG of RCA; Seq VG-distal RCA & then pda  . Coronary angioplasty with stent placement  07/01/2011    DES-Resolute to native LCX  . Coronary angioplasty with stent placement  12/01/2007    COMPLEX 5 LESION PCI INCLUDING CUTTING BALLOON AND 4 CYPHER DESs  . Hernia repair  2006  . Knee surgery Right   . Hand surgery      to take glass out  . Colonoscopy N/A 02/10/2013    Procedure: COLONOSCOPY;  Surgeon: Ladene Artist, MD;  Location: [facility] ENDOSCOPY;  Service: Endoscopy;  Laterality: N/A;  . Retinal detachment surgery    . Loop recorder implant  02/15/2014    MDT LINQ implanted by Dr Rayann Heman for cryptogenic stroke  . Tee without cardioversion N/A 02/15/2014    Procedure: TRANSESOPHAGEAL ECHOCARDIOGRAM (TEE);  Surgeon: Josue Hector, MD;  Location: [facility] ENDOSCOPY;  Service: Cardiovascular;  Laterality: N/A;  . Left heart catheterization with coronary/graft angiogram  07/01/2011    Procedure: LEFT HEART CATHETERIZATION WITH Beatrix Fetters;  Surgeon: Sanda Klein, MD;  Location: [facility] CATH LAB;  Service: Cardiovascular;;  . Percutaneous coronary stent intervention  (pci-s) Right  07/01/2011    Procedure: PERCUTANEOUS CORONARY STENT INTERVENTION (PCI-S);  Surgeon: Sanda Klein, MD;  Location: [facility] CATH LAB;  Service: Cardiovascular;  Laterality: Right;    FAMILY HISTORY: Family History  Problem Relation Age of Onset  . Diabetes type II Mother   . Coronary artery disease    . Colon cancer Neg Hx   . CAD Father 7    CABG, PCI, died at 39  . Heart attack Father 72  . Hypertension Brother   . Diabetes Brother   . Hyperlipidemia Brother   . Brain cancer Maternal Grandmother   . COPD Paternal Grandfather   . Other      denies family history of DVT/PE    SOCIAL HISTORY:  History   Social History  . Marital Status: Married    Spouse Name: N/A    Number of Children: 66  . Years of Education: N/A   Occupational History  .      works at the [employer] Topics  . Smoking status: Former Research scientist (life sciences)  . Smokeless tobacco: Never Used  . Alcohol Use: Yes     Comment: 1-2 times per year  . Drug Use: No  . Sexual Activity: Not on file   Other Topics Concern  . Not on file   Social History Narrative   Quit smoking 40 years ago   Married    Likes to play guitar and sing with church group unable since 02/12/14 stroke      PHYSICAL EXAM  There were no vitals filed for this visit.  There is no weight on file to calculate BMI.   General: The patient is well-developed and well-nourished and in no acute distress.  He is in a wheelchair.  Eyes:  Funduscopic exam shows normal optic discs and retinal vessels.  Neck: The neck is supple, no carotid bruits are noted.  The neck is nontender.  Respiratory: The respiratory examination is clear.  Cardiovascular: The cardiovascular examination reveals a regular rate and rhythm, no murmurs, gallops or rubs are noted.  Skin: He has mild edema at the left ankle, compared to the right.    Neurologic Exam  Mental status: The patient is alert and oriented x 3 at the time of the  examination. The patient has apparent normal recent and remote memory. Attention is mildly reduced.   Speech is normal.  Cranial nerves: Extraocular movements are full. Psychotic pursuit is reduced to the left. Pupils are equal, round, and reactive to light and accomodation.  Visual fields appear to be full by confrontation..  He has a very mild right facial droop.. There is good facial sensation to soft touch bilaterally.  Trapezius and sternocleidomastoid strength is reduced on the left.. No dysarthria is noted.  The tongue is midline, and the patient has symmetric elevation of the soft palate. No obvious hearing deficits are noted.  Motor:  Muscle bulk is normal. He has increased tone in the left arm and leg. This mostly involved the flexor muscles of the arm and flexors and extensors of the leg. Strength is 5/5 on the right. Strength is 2/5 in left shoulder elevation and 1/5 in proximal arm strength. Strength is 2/5 in the quadriceps and 1/5 in other proximal muscles of the left leg  Sensory: Sensory testing is intact to pinprick, soft touch, vibration sensation, and position sense on all 4 extremities. He did not have neglect or extinction at this time.  Coordination: Cerebellar testing reveals good finger-nose-finger  and heel-to-shin on the right and he cannot do this task on the left..  Gait and station: Station requires unilateral support. He is able to take a few steps with support. He was able to pivot without difficulty.    Reflexes: Deep tendon reflexes are increased on the left.. Plantar responses are normal on the right and extensor on the left.Marland Kitchen    DIAGNOSTIC DATA (LABS, IMAGING, TESTING) - I reviewed patient records, labs, notes, testing and imaging myself where available.  Lab Results  Component Value Date   WBC 7.9 04/13/2014   HGB 13.2 04/13/2014   HCT 40.9 04/13/2014   MCV 83.0 04/13/2014   PLT 178 04/13/2014      Component Value Date/Time   NA 141 04/12/2014 0451    K 3.4* 04/12/2014 0451   CL 107 04/12/2014 0451   CO2 25 04/12/2014 0451   GLUCOSE 106* 04/12/2014 0451   BUN 10 04/12/2014 0451   CREATININE 1.01 04/12/2014 0451   CREATININE 1.12 01/04/2013 1206   CALCIUM 8.9 04/12/2014 0451   PROT 6.8 04/12/2014 0451   ALBUMIN 3.2* 04/12/2014 0451   AST 27 04/12/2014 0451   ALT 43 04/12/2014 0451   ALKPHOS 73 04/12/2014 0451   BILITOT 0.7 04/12/2014 0451   GFRNONAA 77* 04/12/2014 0451   GFRAA 89* 04/12/2014 0451   Lab Results  Component Value Date   CHOL 181 02/13/2014   HDL 40 02/13/2014   LDLCALC 120* 02/13/2014   TRIG 105 02/13/2014   CHOLHDL 4.5 02/13/2014   Lab Results  Component Value Date   HGBA1C 5.6 02/13/2014   No results found for: VITAMINB12 Lab Results  Component Value Date   TSH 1.115 01/04/2013        ASSESSMENT AND PLAN  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 Risk factors for his stroke include hypertension, sleep apnea, hyperlipidemia, ischemic cardiomyopathy and coronary artery disease. He is continuing to show some improvement and is able to walk with a quad cane and transfer in and out of a chair with mild assistance. As of the spasticity I will have him try baclofen as it may help. I am going to have him stop the gabapentin during the daytime as it makes him sleepy. He will continue to take at night but increase the dose to 300 mg as it might be helping him sleep. Some of his stroke risk factors. He will continue on CPAP and is encouraged to use it nightly he was advised to change the Xarelto to 20 mg next week and I will write that  prescription for him. He does not think he is getting any benefit of the Ritalin at a low dose of 5 mg by mouth twice a day and I will increase that to 10 mg twice a day. We could consider increasing this further if well tolerated.  He will return to see me in 3 months or sooner if he has new or worsening neurologic symptoms.     Jaylan Duggar A. Felecia Shelling, MD, PhD 05/21/91, 8:18 PM Certified in Neurology, Clinical Neurophysiology, Sleep Medicine, Pain Medicine and Neuroimaging  [facility] [address] [place],  [place] [address] [phone]

## 2014-04-27 ENCOUNTER — Ambulatory Visit: Payer: 59 | Admitting: Diagnostic Neuroimaging

## 2014-05-03 ENCOUNTER — Emergency Department (HOSPITAL_COMMUNITY): Payer: 59

## 2014-05-03 ENCOUNTER — Encounter (HOSPITAL_COMMUNITY): Payer: Self-pay

## 2014-05-03 ENCOUNTER — Encounter: Payer: 59 | Admitting: Gastroenterology

## 2014-05-03 ENCOUNTER — Emergency Department (HOSPITAL_COMMUNITY)
Admission: EM | Admit: 2014-05-03 | Discharge: 2014-05-03 | Disposition: A | Discharge status: Home / Self Care | Payer: 59 | Attending: Emergency Medicine | Admitting: Emergency Medicine

## 2014-05-03 DIAGNOSIS — I10 Essential (primary) hypertension: Secondary | ICD-10-CM | POA: Diagnosis not present

## 2014-05-03 DIAGNOSIS — R42 Dizziness and giddiness: Secondary | ICD-10-CM | POA: Insufficient documentation

## 2014-05-03 DIAGNOSIS — Z8739 Personal history of other diseases of the musculoskeletal system and connective tissue: Secondary | ICD-10-CM | POA: Diagnosis not present

## 2014-05-03 DIAGNOSIS — W19XXXA Unspecified fall, initial encounter: Secondary | ICD-10-CM

## 2014-05-03 DIAGNOSIS — Z7901 Long term (current) use of anticoagulants: Secondary | ICD-10-CM | POA: Insufficient documentation

## 2014-05-03 DIAGNOSIS — R110 Nausea: Secondary | ICD-10-CM | POA: Insufficient documentation

## 2014-05-03 DIAGNOSIS — Y92092 Bedroom in other non-institutional residence as the place of occurrence of the external cause: Secondary | ICD-10-CM | POA: Insufficient documentation

## 2014-05-03 DIAGNOSIS — Z9981 Dependence on supplemental oxygen: Secondary | ICD-10-CM | POA: Diagnosis not present

## 2014-05-03 DIAGNOSIS — I2510 Atherosclerotic heart disease of native coronary artery without angina pectoris: Secondary | ICD-10-CM | POA: Diagnosis not present

## 2014-05-03 DIAGNOSIS — Z8673 Personal history of transient ischemic attack (TIA), and cerebral infarction without residual deficits: Secondary | ICD-10-CM | POA: Insufficient documentation

## 2014-05-03 DIAGNOSIS — Z9861 Coronary angioplasty status: Secondary | ICD-10-CM | POA: Diagnosis not present

## 2014-05-03 DIAGNOSIS — E785 Hyperlipidemia, unspecified: Secondary | ICD-10-CM | POA: Insufficient documentation

## 2014-05-03 DIAGNOSIS — Z79899 Other long term (current) drug therapy: Secondary | ICD-10-CM | POA: Diagnosis not present

## 2014-05-03 DIAGNOSIS — Z7982 Long term (current) use of aspirin: Secondary | ICD-10-CM | POA: Insufficient documentation

## 2014-05-03 DIAGNOSIS — E119 Type 2 diabetes mellitus without complications: Secondary | ICD-10-CM | POA: Insufficient documentation

## 2014-05-03 DIAGNOSIS — Y9389 Activity, other specified: Secondary | ICD-10-CM | POA: Insufficient documentation

## 2014-05-03 DIAGNOSIS — S50812A Abrasion of left forearm, initial encounter: Secondary | ICD-10-CM | POA: Insufficient documentation

## 2014-05-03 DIAGNOSIS — I252 Old myocardial infarction: Secondary | ICD-10-CM | POA: Diagnosis not present

## 2014-05-03 DIAGNOSIS — G4733 Obstructive sleep apnea (adult) (pediatric): Secondary | ICD-10-CM | POA: Insufficient documentation

## 2014-05-03 DIAGNOSIS — W06XXXA Fall from bed, initial encounter: Secondary | ICD-10-CM | POA: Insufficient documentation

## 2014-05-03 DIAGNOSIS — Z9889 Other specified postprocedural states: Secondary | ICD-10-CM | POA: Insufficient documentation

## 2014-05-03 DIAGNOSIS — S4992XA Unspecified injury of left shoulder and upper arm, initial encounter: Secondary | ICD-10-CM | POA: Insufficient documentation

## 2014-05-03 DIAGNOSIS — Z86018 Personal history of other benign neoplasm: Secondary | ICD-10-CM | POA: Insufficient documentation

## 2014-05-03 DIAGNOSIS — R0789 Other chest pain: Secondary | ICD-10-CM

## 2014-05-03 DIAGNOSIS — Z8719 Personal history of other diseases of the digestive system: Secondary | ICD-10-CM | POA: Diagnosis not present

## 2014-05-03 DIAGNOSIS — Z951 Presence of aortocoronary bypass graft: Secondary | ICD-10-CM | POA: Insufficient documentation

## 2014-05-03 DIAGNOSIS — S299XXA Unspecified injury of thorax, initial encounter: Secondary | ICD-10-CM | POA: Diagnosis not present

## 2014-05-03 DIAGNOSIS — Y998 Other external cause status: Secondary | ICD-10-CM | POA: Diagnosis not present

## 2014-05-03 DIAGNOSIS — Z7951 Long term (current) use of inhaled steroids: Secondary | ICD-10-CM | POA: Insufficient documentation

## 2014-05-03 DIAGNOSIS — Z87891 Personal history of nicotine dependence: Secondary | ICD-10-CM | POA: Diagnosis not present

## 2014-05-03 LAB — URINALYSIS, ROUTINE W REFLEX MICROSCOPIC
Glucose, UA: NEGATIVE mg/dL
Hgb urine dipstick: NEGATIVE
Ketones, ur: 15 mg/dL — AB
Nitrite: NEGATIVE
Protein, ur: NEGATIVE mg/dL
Specific Gravity, Urine: 1.026 (ref 1.005–1.030)
Urobilinogen, UA: 0.2 mg/dL (ref 0.0–1.0)
pH: 5.5 (ref 5.0–8.0)

## 2014-05-03 LAB — COMPREHENSIVE METABOLIC PANEL
ALT: 32 U/L (ref 0–53)
AST: 24 U/L (ref 0–37)
Albumin: 4 g/dL (ref 3.5–5.2)
Alkaline Phosphatase: 87 U/L (ref 39–117)
Anion gap: 11 (ref 5–15)
BUN: 13 mg/dL (ref 6–23)
CO2: 24 mmol/L (ref 19–32)
Calcium: 9.4 mg/dL (ref 8.4–10.5)
Chloride: 102 mmol/L (ref 96–112)
Creatinine, Ser: 1.22 mg/dL (ref 0.50–1.35)
GFR calc Af Amer: 71 mL/min — ABNORMAL LOW (ref >90)
GFR calc non Af Amer: 61 mL/min — ABNORMAL LOW (ref >90)
Glucose, Bld: 110 mg/dL — ABNORMAL HIGH (ref 70–99)
Potassium: 3.7 mmol/L (ref 3.5–5.1)
Sodium: 137 mmol/L (ref 135–145)
Total Bilirubin: 1 mg/dL (ref 0.3–1.2)
Total Protein: 8.4 g/dL — ABNORMAL HIGH (ref 6.0–8.3)

## 2014-05-03 LAB — CBC WITH DIFFERENTIAL/PLATELET
Basophils Absolute: 0 10*3/uL (ref 0.0–0.1)
Basophils Relative: 1 % (ref 0–1)
Eosinophils Absolute: 0.3 10*3/uL (ref 0.0–0.7)
Eosinophils Relative: 4 % (ref 0–5)
HCT: 46.2 % (ref 39.0–52.0)
Hemoglobin: 15.4 g/dL (ref 13.0–17.0)
Lymphocytes Relative: 24 % (ref 12–46)
Lymphs Abs: 2 10*3/uL (ref 0.7–4.0)
MCH: 27.5 pg (ref 26.0–34.0)
MCHC: 33.3 g/dL (ref 30.0–36.0)
MCV: 82.5 fL (ref 78.0–100.0)
Monocytes Absolute: 0.6 10*3/uL (ref 0.1–1.0)
Monocytes Relative: 7 % (ref 3–12)
Neutro Abs: 5.2 10*3/uL (ref 1.7–7.7)
Neutrophils Relative %: 64 % (ref 43–77)
Platelets: 205 10*3/uL (ref 150–400)
RBC: 5.6 MIL/uL (ref 4.22–5.81)
RDW: 15.6 % — ABNORMAL HIGH (ref 11.5–15.5)
WBC: 8.1 10*3/uL (ref 4.0–10.5)

## 2014-05-03 LAB — URINE MICROSCOPIC-ADD ON

## 2014-05-03 MED ORDER — ONDANSETRON 4 MG PO TBDP: 4.0000 mg | ORAL_TABLET | Freq: Three times a day (TID) | ORAL | Status: DC | PRN

## 2014-05-03 MED ORDER — ONDANSETRON 4 MG PO TBDP
8.0000 mg | ORAL_TABLET | Freq: Once | ORAL | Status: AC
  Administered 2014-05-03: 8 mg via ORAL
  Filled 2014-05-03: qty 2

## 2014-05-03 MED ORDER — MORPHINE SULFATE 4 MG/ML IJ SOLN
4.0000 mg | Freq: Once | INTRAMUSCULAR | Status: AC
  Administered 2014-05-03: 4 mg via INTRAVENOUS
  Filled 2014-05-03: qty 1

## 2014-05-03 NOTE — ED Notes (Signed)
MRI called stating pt refused MRI. Upon pt's arrival to room, pt was questioned about refusing MRI. Pt stated he did not refuse, he was uncomfortable. Pt stated MRI told him there was an emergency and they needed the room. Pt adamantly denied he refused the MRI.

## 2014-05-03 NOTE — ED Notes (Signed)
Pt states he fell Friday d/t prior stroke and since then has been having problems with his BP going up when he stand up or sit and go down when he would lay down. Pt does report dizziness with position changes and nausea and vomiting.

## 2014-05-03 NOTE — Discharge Instructions (Signed)
Use zofran as needed for nausea, and continue using your meclizine for dizziness. Follow up with ear-nose-and-throat doctor listed above for ongoing evaluation, and follow up with your neurologist for recheck of symptoms in 1 week. Use ice over the chest wall pain, and use your home pain medications for this pain. Return to the ER for changes or worsening symptoms.  Dizziness  Dizziness means you feel unsteady or lightheaded. You might feel like you are going to pass out (faint). HOME CARE   Drink enough fluids to keep your pee (urine) clear or pale yellow.  Take your medicines exactly as told by your doctor. If you take blood pressure medicine, always stand up slowly from the lying or sitting position. Hold on to something to steady yourself.  If you need to stand in one place for a long time, move your legs often. Tighten and relax your leg muscles.  Have someone stay with you until you feel okay.  Do not drive or use heavy machinery if you feel dizzy.  Do not drink alcohol. GET HELP RIGHT AWAY IF:   You feel dizzy or lightheaded and it gets worse.  You feel sick to your stomach (nauseous), or you throw up (vomit).  You have trouble talking or walking.  You feel weak or have trouble using your arms, hands, or legs.  You cannot think clearly or have trouble forming sentences.  You have chest pain, belly (abdominal) pain, sweating, or you are short of breath.  Your vision changes.  You are bleeding.  You have problems from your medicine that seem to be getting worse. MAKE SURE YOU:   Understand these instructions.  Will watch your condition.  Will get help right away if you are not doing well or get worse. Document Released: 02/21/2011 Document Revised: 05/27/2011 Document Reviewed: 02/21/2011 [facility] Patient Information 2015 [place], [place]. This information is not intended to replace advice given to you by your health care provider. Make sure you discuss any questions  you have with your health care provider.   Benign Positional Vertigo Vertigo means you feel like you or your surroundings are moving when they are not. Benign positional vertigo is the most common form of vertigo. Benign means that the cause of your condition is not serious. Benign positional vertigo is more common in older adults. CAUSES  Benign positional vertigo is the result of an upset in the labyrinth system. This is an area in the middle ear that helps control your balance. This may be caused by a viral infection, head injury, or repetitive motion. However, often no specific cause is found. SYMPTOMS  Symptoms of benign positional vertigo occur when you move your head or eyes in different directions. Some of the symptoms may include:  Loss of balance and falls.  Vomiting.  Blurred vision.  Dizziness.  Nausea.  Involuntary eye movements (nystagmus). DIAGNOSIS  Benign positional vertigo is usually diagnosed by physical exam. If the specific cause of your benign positional vertigo is unknown, your caregiver may perform imaging tests, such as magnetic resonance imaging (MRI) or computed tomography (CT). TREATMENT  Your caregiver may recommend movements or procedures to correct the benign positional vertigo. Medicines such as meclizine, benzodiazepines, and medicines for nausea may be used to treat your symptoms. In rare cases, if your symptoms are caused by certain conditions that affect the inner ear, you may need surgery. HOME CARE INSTRUCTIONS   Follow your caregiver's instructions.  Move slowly. Do not make sudden body or head movements.  Avoid driving.  Avoid operating heavy machinery.  Avoid performing any tasks that would be dangerous to you or others during a vertigo episode.  Drink enough fluids to keep your urine clear or pale yellow. SEEK IMMEDIATE MEDICAL CARE IF:   You develop problems with walking, weakness, numbness, or using your arms, hands, or legs.  You  have difficulty speaking.  You develop severe headaches.  Your nausea or vomiting continues or gets worse.  You develop visual changes.  Your family or friends notice any behavioral changes.  Your condition gets worse.  You have a fever.  You develop a stiff neck or sensitivity to light. MAKE SURE YOU:   Understand these instructions.  Will watch your condition.  Will get help right away if you are not doing well or get worse. Document Released: 12/10/2005 Document Revised: 05/27/2011 Document Reviewed: 11/22/2010 [facility] Patient Information 2015 [place], [place]. This information is not intended to replace advice given to you by your health care provider. Make sure you discuss any questions you have with your health care provider.  Chest Wall Pain Chest wall pain is pain in or around the bones and muscles of your chest. It may take up to 6 weeks to get better. It may take longer if you must stay physically active in your work and activities.  CAUSES  Chest wall pain may happen on its own. However, it may be caused by:  A viral illness like the flu.  Injury.  Coughing.  Exercise.  Arthritis.  Fibromyalgia.  Shingles. HOME CARE INSTRUCTIONS   Avoid overtiring physical activity. Try not to strain or perform activities that cause pain. This includes any activities using your chest or your abdominal and side muscles, especially if heavy weights are used.  Put ice on the sore area.  Put ice in a plastic bag.  Place a towel between your skin and the bag.  Leave the ice on for 15-20 minutes per hour while awake for the first 2 days.  Only take over-the-counter or prescription medicines for pain, discomfort, or fever as directed by your caregiver. SEEK IMMEDIATE MEDICAL CARE IF:   Your pain increases, or you are very uncomfortable.  You have a fever.  Your chest pain becomes worse.  You have new, unexplained symptoms.  You have nausea or vomiting.  You feel  sweaty or lightheaded.  You have a cough with phlegm (sputum), or you cough up blood. MAKE SURE YOU:   Understand these instructions.  Will watch your condition.  Will get help right away if you are not doing well or get worse. Document Released: 03/04/2005 Document Revised: 05/27/2011 Document Reviewed: 10/29/2010 [facility] Patient Information 2015 [place], [place]. This information is not intended to replace advice given to you by your health care provider. Make sure you discuss any questions you have with your health care provider.

## 2014-05-03 NOTE — ED Notes (Signed)
Wife helping pt to use the urinal

## 2014-05-03 NOTE — ED Notes (Signed)
MRI called about delay in pt to MRI. MRI states pt has one person in front of him

## 2014-05-03 NOTE — ED Provider Notes (Signed)
Care assumed from [facility], PA-C at shift change. Pt here with vertigo and a fall. MRI brain pending, then dispo accordingly, if MRI neg then can go home with neuro f/up and meclizine.   Physical Exam  BP 126/72 mmHg  Pulse 78  Temp(Src) 98.3 F (36.8 C) (Oral)  Resp 14  Ht 6' (1.829 m)  Wt 300 lb 6.4 oz (136.261 kg)  BMI 40.73 kg/m2  SpO2 98%  Physical Exam Gen: afebrile, VSS, NAD HEENT: EOMI, no facial asymmetry, no nystagmus, PERRL, MMM Resp: no resp distress, no hypoxia CV: rate WNL Abd: appearance normal, nondistended although obese therefore somewhat limited exam due to body habitus MsK: baseline L sided weakness from prior CVA Neuro: A&O x4, no focal neuro deficits  ED Course  Procedures Results for orders placed or performed during the hospital encounter of 05/03/14  Comprehensive metabolic panel  Result Value Ref Range   Sodium 137 135 - 145 mmol/L   Potassium 3.7 3.5 - 5.1 mmol/L   Chloride 102 96 - 112 mmol/L   CO2 24 19 - 32 mmol/L   Glucose, Bld 110 (H) 70 - 99 mg/dL   BUN 13 6 - 23 mg/dL   Creatinine, Ser 1.22 0.50 - 1.35 mg/dL   Calcium 9.4 8.4 - 10.5 mg/dL   Total Protein 8.4 (H) 6.0 - 8.3 g/dL   Albumin 4.0 3.5 - 5.2 g/dL   AST 24 0 - 37 U/L   ALT 32 0 - 53 U/L   Alkaline Phosphatase 87 39 - 117 U/L   Total Bilirubin 1.0 0.3 - 1.2 mg/dL   GFR calc non Af Amer 61 (L) >90 mL/min   GFR calc Af Amer 71 (L) >90 mL/min   Anion gap 11 5 - 15  CBC with Differential  Result Value Ref Range   WBC 8.1 4.0 - 10.5 K/uL   RBC 5.60 4.22 - 5.81 MIL/uL   Hemoglobin 15.4 13.0 - 17.0 g/dL   HCT 46.2 39.0 - 52.0 %   MCV 82.5 78.0 - 100.0 fL   MCH 27.5 26.0 - 34.0 pg   MCHC 33.3 30.0 - 36.0 g/dL   RDW 15.6 (H) 11.5 - 15.5 %   Platelets 205 150 - 400 K/uL   Neutrophils Relative % 64 43 - 77 %   Neutro Abs 5.2 1.7 - 7.7 K/uL   Lymphocytes Relative 24 12 - 46 %   Lymphs Abs 2.0 0.7 - 4.0 K/uL   Monocytes Relative 7 3 - 12 %   Monocytes Absolute 0.6 0.1 - 1.0  K/uL   Eosinophils Relative 4 0 - 5 %   Eosinophils Absolute 0.3 0.0 - 0.7 K/uL   Basophils Relative 1 0 - 1 %   Basophils Absolute 0.0 0.0 - 0.1 K/uL  Urinalysis, Routine w reflex microscopic  Result Value Ref Range   Color, Urine AMBER (A) YELLOW   APPearance CLEAR CLEAR   Specific Gravity, Urine 1.026 1.005 - 1.030   pH 5.5 5.0 - 8.0   Glucose, UA NEGATIVE NEGATIVE mg/dL   Hgb urine dipstick NEGATIVE NEGATIVE   Bilirubin Urine SMALL (A) NEGATIVE   Ketones, ur 15 (A) NEGATIVE mg/dL   Protein, ur NEGATIVE NEGATIVE mg/dL   Urobilinogen, UA 0.2 0.0 - 1.0 mg/dL   Nitrite NEGATIVE NEGATIVE   Leukocytes, UA SMALL (A) NEGATIVE  Urine microscopic-add on  Result Value Ref Range   Squamous Epithelial / LPF FEW (A) RARE   WBC, UA 3-6 <3 WBC/hpf  RBC / HPF 0-2 <3 RBC/hpf   Bacteria, UA RARE RARE   Urine-Other MUCOUS PRESENT    Dg Ribs Unilateral W/chest Left  05/03/2014   CLINICAL DATA:  Left rib pain, injury, fell Saturday  EXAM: LEFT RIBS AND CHEST - 3+ VIEW  COMPARISON:  04/11/2013  FINDINGS: Cardiomediastinal silhouette is stable. Again noted chronic blunting of the left costophrenic angle with atelectasis or scarring in left base laterally. Status post median sternotomy. No segmental infiltrate or pulmonary edema. No left rib fracture is identified. There is no pneumothorax.  IMPRESSION: No left rib fracture. No pneumothorax. Status post median sternotomy. Stable chronic blunting of the left costophrenic angle with atelectasis or scarring left base.   Electronically Signed   By: Lahoma Crocker M.D.   On: 05/03/2014 12:29   Ct Angio Chest Pe W/cm &/or Wo Cm  04/11/2014   CLINICAL DATA:  Acute onset of shortness of breath. Initial encounter.  EXAM: CT ANGIOGRAPHY CHEST WITH CONTRAST  TECHNIQUE: Multidetector CT imaging of the chest was performed using the standard protocol during bolus administration of intravenous contrast. Multiplanar CT image reconstructions and MIPs were obtained to evaluate  the vascular anatomy.  CONTRAST:  125mL OMNIPAQUE IOHEXOL 350 MG/ML SOLN  COMPARISON:  Chest radiograph performed earlier today at 6:17 p.m.  FINDINGS: Large bilateral pulmonary emboli are noted within the main pulmonary arteries, extending distally into branches to all lobes of both lungs. The RV/LV ratio of 1.0 corresponds to right heart strain, and concern for submassive pulmonary embolus.  The lungs are essentially clear bilaterally. There is no evidence of significant focal consolidation, pleural effusion or pneumothorax. No masses are identified; no abnormal focal contrast enhancement is seen.  The mediastinum is otherwise unremarkable. Diffuse coronary artery calcifications are seen. The patient is status post median sternotomy. No mediastinal lymphadenopathy is seen. No pericardial effusion is identified. The great vessels are grossly unremarkable. No axillary lymphadenopathy is seen. The visualized portions of the thyroid gland are unremarkable in appearance.  The visualized portions of the liver and spleen are unremarkable. A small metallic device is noted at the anterior left chest wall.  No acute osseous abnormalities are seen. Anterior bridging osteophytes are noted along the lower thoracic spine, and mild degenerative change is noted at the lower cervical spine.  Review of the MIP images confirms the above findings.  IMPRESSION: 1. Large bilateral pulmonary emboli within the main pulmonary arteries, extending distally into branches to all lobes of both lungs. RV/LV ratio 1.0 corresponds to right heart strain, and concern for submassive pulmonary embolus. 2. Lungs clear bilaterally. 3. Diffuse coronary artery calcifications seen.  Critical Value/emergent results were called by telephone at the time of interpretation on 04/11/2014 at 8:55 pm to Dr. Threasa Beards BELFI, who verbally acknowledged these results.   Electronically Signed   By: Garald Balding M.D.   On: 04/11/2014 21:21   [name]  (neuro Protocol)  05/03/2014   CLINICAL DATA:  Initial evaluation for acute dizziness. Evaluate for posterior circulation stroke.  EXAM: MRI HEAD WITHOUT CONTRAST  TECHNIQUE: Multiplanar, multiecho pulse sequences of the brain and surrounding structures were obtained without intravenous contrast.  COMPARISON:  Prior MRI from 02/12/2014  FINDINGS: Study is somewhat degraded by motion artifact.  Encephalomalacia within the right temporal region and insula compatible with remote right MCA territory infarct. Remote infarct involves the right basal ganglia as well.  Mild diffuse prominence of the CSF containing spaces compatible with generalized cerebral atrophy. Scattered and confluent T2/FLAIR  hyperintensity within the periventricular and deep white matter both cerebral hemispheres most consistent with chronic small vessel ischemic changes. Normal intravascular flow voids are maintained.  No abnormal foci of restricted diffusion to suggest acute intracranial infarct. Gray-white matter differentiation is otherwise maintained. No acute intracranial hemorrhage.  No mass lesion or midline shift. No hydrocephalus. No extra-axial fluid collection.  Craniocervical junction is normal. Pituitary gland within normal limits. No in abnormality seen about the orbits.  Scattered mucoperiosteal thickening seen throughout the paranasal sinuses. No air-fluid levels to suggest active sinus disease. No mastoid effusion.  Bone marrow signal intensity is normal. No acute abnormality seen within the scalp soft tissues.  IMPRESSION: 1. No acute intracranial infarct or other abnormality identified. 2. Sequela of remote right MCA territory infarct involving the right basal ganglia and right temporal region. 3. Atrophy with moderate chronic microvascular ischemic changes.   Electronically Signed   By: Jeannine Boga M.D.   On: 05/03/2014 21:33   Dg Chest Port 1 View  04/11/2014   CLINICAL DATA:  Central chest pain and pressure with  shortness of breath for 1 day.  EXAM: PORTABLE CHEST - 1 VIEW  COMPARISON:  Chest x-ray 03/14/2014.  FINDINGS: Lung volumes are low. No acute consolidative airspace disease. No definite pleural effusions. No evidence of pulmonary edema. Heart size is within normal limits. The patient is rotated to the left on today's exam, resulting in distortion of the mediastinal contours and reduced diagnostic sensitivity and specificity for mediastinal pathology. Status post median sternotomy for CABG. Implantable [employer] loop recorder projecting over the lower left hemithorax.  IMPRESSION: 1. Low lung volumes without radiographic evidence of acute cardiopulmonary disease. 2. Postoperative changes and support apparatus, as above.   Electronically Signed   By: Vinnie Langton M.D.   On: 04/11/2014 19:00   Dg Shoulder Left  05/03/2014   CLINICAL DATA:  Left shoulder pain, injury, fell Saturday  EXAM: LEFT SHOULDER - 2+ VIEW  COMPARISON:  None.  FINDINGS: Three views of the left shoulder submitted. There is deformity in mid aspect of the left clavicle question previous fracture. No acute fracture or subluxation. Degenerative changes acromioclavicular joint. Mild degenerative changes glenohumeral joint.  IMPRESSION: No acute fracture or subluxation. Question old fracture of mid aspect of left clavicle. Degenerative changes acromioclavicular joint. Mild degenerative changes AC joint.   Electronically Signed   By: Lahoma Crocker M.D.   On: 05/03/2014 12:32   EKG Interpretation  Date/Time:  Tuesday May 03 2014 09:48:35 EST Ventricular Rate:  80 PR Interval:  156 QRS Duration: 96 QT Interval:  429 QTC Calculation: 495 R Axis:   -36 Text Interpretation:  Normal sinus rhythm Left axis deviation Low voltage, precordial leads Consider anterior infarct Borderline repolarization abnormality no significant change since Apr 12, 2014 Confirmed by Regenia Skeeter  MD, SCOTT [phone]) on 05/03/2014 9:28:15 PM   Meds ordered this encounter   Medications  . morphine 4 MG/ML injection 4 mg    Sig:   . morphine 4 MG/ML injection 4 mg    Sig:   . ondansetron (ZOFRAN ODT) 4 MG disintegrating tablet    Sig: Take 1 tablet (4 mg total) by mouth every 8 (eight) hours as needed for nausea or vomiting.    Dispense:  15 tablet    Refill:  0    Order Specific Question:  Supervising Provider    Answer:  Noemi Chapel D [[id number]]     MDM:   ICD-9-CM ICD-10-CM   1. Dizziness 780.4  R42   2. Nausea 787.02 R11.0   3. Chest wall pain 786.52 R07.89   4. Fall, initial encounter [phone] W19.XXXA     9:11 PM Pt finally back from MRI, results still pending. States he took meclizine at home but vomited it back up because when the dizziness occurs (which is usually caused from head movement) he gets nauseated. Doesn't have symptoms at this time, but states if he turns or gets up he gets symptomatic. The dizziness lasts just a few seconds. Seems like maybe BPPV could be cause, since he states that sometimes his hearing is "different" when he gets vertiginous. Could possibly d/c with nausea meds and ENT f/up but will await MRI reading.   9:39 PM MRI reading with no acute changes. Will give zofran for home, and have him see ENT f/up but continue taking meclizine. 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 Discussed ice/heat and home pain meds for his chest wall pain from the fall, xrays neg. I explained the diagnosis and have given explicit precautions to return to the ER including for any other new or worsening symptoms. The patient understands and accepts the medical plan as it's been dictated and I have answered their questions. Discharge instructions concerning home care and prescriptions have been given. The patient is STABLE and is discharged to home in good condition.  BP 122/61 mmHg  Pulse 77  Temp(Src) 98.3 F (36.8 C) (Oral)  Resp 16  Ht 6' (1.829 m)  Wt 300 lb 6.4 oz (136.261 kg)  BMI 40.73  kg/m2  SpO2 93%    Patty Sermons [place], PA-C 05/03/14 2146  Charlesetta Shanks, MD 05/05/14 2008

## 2014-05-03 NOTE — ED Notes (Signed)
PA student at the bedside.  

## 2014-05-03 NOTE — ED Notes (Signed)
Pt. Left with all belongings 

## 2014-05-03 NOTE — ED Notes (Signed)
Gerald Stabs, PA at the bedside.

## 2014-05-03 NOTE — ED Provider Notes (Signed)
CSN: [id number]     Arrival date & time 05/03/14  0935 History   First MD Initiated Contact with Patient 05/03/14 [phone]     Chief Complaint  Patient presents with  . 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  He was also recently admitted for bilateral PE.  Associated sxs include N/V and left sided shoulder pain.  No LOC or syncope.  Denies fever, chills, HA, abdominal pain, SOB, CP, lower extremity pain, change in bowel habits, or hematochezia.  He states that the dizziness began after his fall and is made worse when changing position, he states it feels like his "inner ear is out of whack".  He also gets dizzy from changing from seated to standing positions.  Orthostatic vitals were unable to be completed due to dizziness upon standing.  He has had no recent head trauma.   Past Medical History  Diagnosis Date  . Coronary artery disease     stent, then CABG 07/20/10  . MI (myocardial infarction)     NSTEMI- last cath 06/2011-Stent to LCX-DES, last nuc 07/30/11 low risk  . Diabetes mellitus     hx of, diet controlled  . Hypertension   . Hyperlipidemia   . Obesity (BMI 30-39.9)   . OSA on CPAP     since July 2013  . H/O cardiomyopathy     ischemic, now last echo 07/30/11, EF 38%W  . Diverticulosis   . Internal hemorrhoids   . Tubular adenoma of colon   . Plantar fasciitis of left foot   . Stroke    Past Surgical History  Procedure Laterality Date  . Coronary artery bypass graft  07/20/2010     LIMA-LAD; VG-ACUTE MARG of RCA; Seq VG-distal RCA & then pda  . Coronary angioplasty with stent placement  07/01/2011    DES-Resolute to native LCX  . Coronary angioplasty with stent placement  12/01/2007    COMPLEX 5 LESION PCI INCLUDING CUTTING BALLOON  AND 4 CYPHER DESs  . Hernia repair  2006  . Knee surgery Right   . Hand surgery      to take glass out  . Colonoscopy N/A 02/10/2013    Procedure: COLONOSCOPY;  Surgeon: Ladene Artist, MD;  Location: [facility] ENDOSCOPY;  Service: Endoscopy;  Laterality: N/A;  . Retinal detachment surgery    . Loop recorder implant  02/15/2014    MDT LINQ implanted by Dr Rayann Heman for cryptogenic stroke  . Tee without cardioversion N/A 02/15/2014    Procedure: TRANSESOPHAGEAL ECHOCARDIOGRAM (TEE);  Surgeon: Josue Hector, MD;  Location: [facility] ENDOSCOPY;  Service: Cardiovascular;  Laterality: N/A;  . Left heart catheterization with coronary/graft angiogram  07/01/2011    Procedure: LEFT HEART CATHETERIZATION WITH Beatrix Fetters;  Surgeon: Sanda Klein, MD;  Location: [place] CATH LAB;  Service: Cardiovascular;;  . Percutaneous coronary stent intervention (pci-s) Right 07/01/2011    Procedure: PERCUTANEOUS CORONARY STENT INTERVENTION (PCI-S);  Surgeon: Sanda Klein, MD;  Location: [facility] CATH LAB;  Service: Cardiovascular;  Laterality: Right;   Family History  Problem Relation Age of Onset  . Diabetes type II Mother   . Coronary artery disease    . Colon cancer Neg Hx   . CAD Father 35    CABG, PCI, died at 58  . Heart attack Father 99  . Hypertension Brother   .  Diabetes Brother   . Hyperlipidemia Brother   . Brain cancer Maternal Grandmother   . COPD Paternal Grandfather   . Other      denies family history of DVT/PE   History  Substance Use Topics  . Smoking status: Former Research scientist (life sciences)  . Smokeless tobacco: Never Used  . Alcohol Use: Yes     Comment: 1-2 times per year    Review of Systems All other systems negative except as documented in the HPI. All pertinent positives and negatives as reviewed in the HPI.   Allergies  Diphenhydramine hcl  Home Medications   Prior to Admission medications   Medication Sig Start Date End Date Taking? Authorizing Provider  acetaminophen (TYLENOL) 325 MG tablet  Take 2 tablets (650 mg total) by mouth every 6 (six) hours as needed for mild pain. 03/15/14   Bary Leriche, PA-C  ALPRAZolam Duanne Moron) 0.25 MG tablet Take 0.5 tablets (0.125 mg total) by mouth every 8 (eight) hours as needed for anxiety. 04/14/14   Tiffany L Reed, DO  aspirin EC 81 MG EC tablet Take 1 tablet (81 mg total) by mouth daily. 02/15/14   Drucilla Schmidt, MD  atorvastatin (LIPITOR) 80 MG tablet Take 1 tablet (80 mg total) by mouth daily at 6 PM. 02/15/14   Drucilla Schmidt, MD  baclofen (LIORESAL) 10 MG tablet Take 1 tablet (10 mg total) by mouth 4 (four) times daily. 04/26/14   Richard A. Sater, MD  carvedilol (COREG) 3.125 MG tablet Take 3.125 mg by mouth 2 (two) times daily with a meal.    Historical Provider, MD  colchicine 0.6 MG tablet Take 0.6 mg by mouth as needed (for gout).  04/09/14   Historical Provider, MD  fluticasone (FLONASE) 50 MCG/ACT nasal spray Place 2 sprays into both nostrils daily. 03/15/14   Bary Leriche, PA-C  furosemide (LASIX) 40 MG tablet Take 40 mg by mouth as needed for edema.    Historical Provider, MD  gabapentin (NEURONTIN) 100 MG capsule Take 1 capsule (100 mg total) by mouth 3 (three) times daily. Patient taking differently: Take 100 mg by mouth as needed (for neuropathy).  03/15/14   Bary Leriche, PA-C  meclizine (ANTIVERT) 25 MG tablet Take 1 tablet (25 mg total) by mouth 3 (three) times daily as needed for dizziness. Patient not taking: Reported on 04/26/2014 03/15/14   Ivan Anchors Love, PA-C  Menthol-Methyl Salicylate (MUSCLE RUB) 10-15 % CREA Apply 1 application topically 2 (two) times daily. To left ankle and left shoulder 03/15/14   Ivan Anchors Love, PA-C  methylphenidate (RITALIN) 10 MG tablet Take 1 tablet (10 mg total) by mouth 2 (two) times daily. 04/26/14   Richard A. Sater, MD  methylphenidate (RITALIN) 5 MG tablet Take one tablet by mouth twice daily with breakfast and lunch 04/14/14   Tiffany L Reed, DO  nitroGLYCERIN (NITROSTAT) 0.4 MG SL tablet Place 0.4 mg  under the tongue every 5 (five) minutes as needed. For chest pain    Historical Provider, MD  OVER THE COUNTER MEDICATION Inhale 1 application into the lungs at bedtime. C-PAP    Historical Provider, MD  pantoprazole (PROTONIX) 40 MG tablet Take 1 tablet (40 mg total) by mouth 2 (two) times daily. 03/15/14   Ivan Anchors Love, PA-C  protein supplement (RESOURCE BENEPROTEIN) POWD Take 1 scoop by mouth 3 (three) times daily with meals.    Historical Provider, MD  ranolazine (RANEXA) 500 MG 12 hr tablet Take 500 mg by mouth 2 (two)  times daily.    Historical Provider, MD  rivaroxaban (XARELTO) 20 MG TABS tablet Take 1 tablet (20 mg total) by mouth daily with supper. 05/04/14   Richard A. Sater, MD  Vitamin D, Ergocalciferol, (DRISDOL) 50000 UNITS CAPS capsule Take 50,000 Units by mouth every 7 (seven) days. Take on Mondays    Historical Provider, MD   BP 129/86 mmHg  Pulse 77  Temp(Src) 98.3 F (36.8 C) (Oral)  Resp 20  Ht 6' (1.829 m)  Wt 300 lb 6.4 oz (136.261 kg)  BMI 40.73 kg/m2  SpO2 96% Physical Exam  Constitutional: He is oriented to person, place, and time. He appears well-developed and well-nourished.  HENT:  Head: Normocephalic and atraumatic.  Right Ear: External ear normal.  Left Ear: External ear normal.  Eyes: Conjunctivae and EOM are normal. Pupils are equal, round, and reactive to light.  Neck: Normal range of motion. Neck supple.  Cardiovascular: Normal rate, regular rhythm and normal heart sounds.   No murmur heard. Pulmonary/Chest: Effort normal and breath sounds normal. He has no wheezes. He has no rales.  Abdominal: Soft. Bowel sounds are normal. He exhibits no distension. There is no tenderness.  Musculoskeletal:       Right shoulder: Normal.       Left shoulder: He exhibits decreased range of motion, pain and decreased strength. He exhibits no tenderness, no bony tenderness, no swelling, no effusion and no deformity.       Left elbow: Normal.  Neurological: He is alert  and oriented to person, place, and time.  Skin: Skin is warm and dry.       ED Course  Procedures (including critical care time) Labs Review Labs Reviewed - No data to display  Imaging Review Dg Ribs Unilateral W/chest Left  05/03/2014   CLINICAL DATA:  Left rib pain, injury, fell Saturday  EXAM: LEFT RIBS AND CHEST - 3+ VIEW  COMPARISON:  04/11/2013  FINDINGS: Cardiomediastinal silhouette is stable. Again noted chronic blunting of the left costophrenic angle with atelectasis or scarring in left base laterally. Status post median sternotomy. No segmental infiltrate or pulmonary edema. No left rib fracture is identified. There is no pneumothorax.  IMPRESSION: No left rib fracture. No pneumothorax. Status post median sternotomy. Stable chronic blunting of the left costophrenic angle with atelectasis or scarring left base.   Electronically Signed   By: Lahoma Crocker M.D.   On: 05/03/2014 12:29   [name] (neuro Protocol)  05/03/2014   CLINICAL DATA:  Initial evaluation for acute dizziness. Evaluate for posterior circulation stroke.  EXAM: MRI HEAD WITHOUT CONTRAST  TECHNIQUE: Multiplanar, multiecho pulse sequences of the brain and surrounding structures were obtained without intravenous contrast.  COMPARISON:  Prior MRI from 02/12/2014  FINDINGS: Study is somewhat degraded by motion artifact.  Encephalomalacia within the right temporal region and insula compatible with remote right MCA territory infarct. Remote infarct involves the right basal ganglia as well.  Mild diffuse prominence of the CSF containing spaces compatible with generalized cerebral atrophy. Scattered and confluent T2/FLAIR hyperintensity within the periventricular and deep white matter both cerebral hemispheres most consistent with chronic small vessel ischemic changes. Normal intravascular flow voids are maintained.  No abnormal foci of restricted diffusion to suggest acute intracranial infarct. Gray-white matter  differentiation is otherwise maintained. No acute intracranial hemorrhage.  No mass lesion or midline shift. No hydrocephalus. No extra-axial fluid collection.  Craniocervical junction is normal. Pituitary gland within normal limits. No in abnormality seen about the  orbits.  Scattered mucoperiosteal thickening seen throughout the paranasal sinuses. No air-fluid levels to suggest active sinus disease. No mastoid effusion.  Bone marrow signal intensity is normal. No acute abnormality seen within the scalp soft tissues.  IMPRESSION: 1. No acute intracranial infarct or other abnormality identified. 2. Sequela of remote right MCA territory infarct involving the right basal ganglia and right temporal region. 3. Atrophy with moderate chronic microvascular ischemic changes.   Electronically Signed   By: Jeannine Boga M.D.   On: 05/03/2014 21:33   Dg Shoulder Left  05/03/2014   CLINICAL DATA:  Left shoulder pain, injury, fell Saturday  EXAM: LEFT SHOULDER - 2+ VIEW  COMPARISON:  None.  FINDINGS: Three views of the left shoulder submitted. There is deformity in mid aspect of the left clavicle question previous fracture. No acute fracture or subluxation. Degenerative changes acromioclavicular joint. Mild degenerative changes glenohumeral joint.  IMPRESSION: No acute fracture or subluxation. Question old fracture of mid aspect of left clavicle. Degenerative changes acromioclavicular joint. Mild degenerative changes AC joint.   Electronically Signed   By: Lahoma Crocker M.D.   On: 05/03/2014 12:32   The patient has had vertiginous symptoms over the last 3 days.  Therefore, an MRI was ordered due to his risk of stroke being high.      Brent General, PA-C 05/05/14 0040  Charlesetta Shanks, MD 05/05/14 2041

## 2014-05-04 ENCOUNTER — Telehealth: Payer: Self-pay | Admitting: Cardiovascular Disease

## 2014-05-04 NOTE — Telephone Encounter (Addendum)
Gregory Wiley is calling back to see if the Prior authorization for the Xarelto has been taken care of. Please f/u because pt is currently out She is concerned about the pt not having this medication because he was just in the ER for a stroke and PE, so she would like to know if there is another blood thinner that he could take until he is able to get his Xarelto Thanks

## 2014-05-04 NOTE — Telephone Encounter (Signed)
PA approved over phone. Pharmacy and patient notified.

## 2014-05-04 NOTE — Telephone Encounter (Signed)
Pt need a prior authorization for his Xarelto please. Please call to [facility] [phone].

## 2014-05-05 ENCOUNTER — Other Ambulatory Visit: Payer: Self-pay | Admitting: Neurology

## 2014-05-05 LAB — MDC_IDC_ENUM_SESS_TYPE_REMOTE
Date Time Interrogation Session: 20160128011031
Zone Setting Detection Interval: 2000 ms
Zone Setting Detection Interval: 3000 ms
Zone Setting Detection Interval: 360 ms

## 2014-05-05 MED ORDER — RIVAROXABAN 20 MG PO TABS: 20.0000 mg | ORAL_TABLET | Freq: Every day | ORAL | Status: DC

## 2014-05-10 ENCOUNTER — Telehealth: Payer: Self-pay | Admitting: Neurology

## 2014-05-10 NOTE — Telephone Encounter (Signed)
Physical Therapist, [place] with [facility] @ [phone], requesting verbal orders for 2 x's a week for 8 weeks.  Please call and advise.

## 2014-05-10 NOTE — Telephone Encounter (Signed)
Spoke with Wes and gave verbal order for pt twice weekly for 8 weeks.  He will send written orders to be signed/fim

## 2014-05-12 ENCOUNTER — Encounter: Payer: 59 | Admitting: Cardiovascular Disease

## 2014-05-12 ENCOUNTER — Encounter: Payer: Self-pay | Admitting: Physician Assistant

## 2014-05-12 ENCOUNTER — Ambulatory Visit (INDEPENDENT_AMBULATORY_CARE_PROVIDER_SITE_OTHER): Payer: 59 | Admitting: Physician Assistant

## 2014-05-12 VITALS — BP 111/80 | HR 82 | Ht 72.0 in | Wt 297.0 lb

## 2014-05-12 DIAGNOSIS — E785 Hyperlipidemia, unspecified: Secondary | ICD-10-CM

## 2014-05-12 DIAGNOSIS — I5022 Chronic systolic (congestive) heart failure: Secondary | ICD-10-CM | POA: Diagnosis not present

## 2014-05-12 DIAGNOSIS — G4733 Obstructive sleep apnea (adult) (pediatric): Secondary | ICD-10-CM

## 2014-05-12 DIAGNOSIS — I255 Ischemic cardiomyopathy: Secondary | ICD-10-CM | POA: Diagnosis not present

## 2014-05-12 DIAGNOSIS — I10 Essential (primary) hypertension: Secondary | ICD-10-CM

## 2014-05-12 DIAGNOSIS — I63411 Cerebral infarction due to embolism of right middle cerebral artery: Secondary | ICD-10-CM

## 2014-05-12 DIAGNOSIS — I2510 Atherosclerotic heart disease of native coronary artery without angina pectoris: Secondary | ICD-10-CM | POA: Diagnosis not present

## 2014-05-12 DIAGNOSIS — I2699 Other pulmonary embolism without acute cor pulmonale: Secondary | ICD-10-CM

## 2014-05-12 MED ORDER — RANOLAZINE ER 500 MG PO TB12: 500.0000 mg | ORAL_TABLET | Freq: Two times a day (BID) | ORAL | Status: DC

## 2014-05-12 MED ORDER — CARVEDILOL 3.125 MG PO TABS: 3.1250 mg | ORAL_TABLET | Freq: Two times a day (BID) | ORAL | Status: DC

## 2014-05-12 MED ORDER — ATORVASTATIN CALCIUM 80 MG PO TABS: 80.0000 mg | ORAL_TABLET | Freq: Every day | ORAL | Status: DC

## 2014-05-12 MED ORDER — ASPIRIN 81 MG PO TBEC: 81.0000 mg | DELAYED_RELEASE_TABLET | Freq: Every day | ORAL | Status: DC

## 2014-05-12 MED ORDER — PANTOPRAZOLE SODIUM 40 MG PO TBEC: 40.0000 mg | DELAYED_RELEASE_TABLET | Freq: Two times a day (BID) | ORAL | Status: DC

## 2014-05-12 NOTE — Patient Instructions (Addendum)
Your physician recommends that you continue on your current medications as directed. Please refer to the Current Medication list given to you today.   RX SENT IN FOR ASPIRIN , LIPITOR, PROTONIX,  [facility]  RX FOR COREG AND RANEXA  SENT IN TO [facility]    KEEP FOLLOW UP APPOINTMENT AS SCHEDULED

## 2014-05-12 NOTE — Progress Notes (Signed)
Cardiology Office Note   Date:  05/12/2014   ID:  Gregory Wiley, DOB 06-Sep-1950, MRN [id number]  PCP:  Tamsen Roers, MD  Cardiologist:  Dr. Shelva Majestic   Electrophysiologist:  Dr. Thompson Grayer (implanted ILR)  Chief Complaint  Patient presents with  . Cerebrovascular Accident     History of Present Illness: Gregory Wiley is a 64 y.o. male with a hx of CAD status post prior PCI to the RCA and subsequent CABG, non-STEMI in 06/2011 treated with a DES to the LCx, ischemic cardiomyopathy with an EF of approximately 38% the past, OSA on CPAP, HL, HTN, obesity, PUD. Myoview performed in 06/2013 was low risk with prior scar but no ischemia, EF 33%. Follow-up echocardiogram demonstrated EF 40-45%. Last seen in the office 07/2013.   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 TEE was negative for embolic source. EF was 45% on echocardiogram. He was seen by Dr. Rayann Heman and underwent ILR implantation.  He spent some time in inpatient rehabilitation and then was discharged to SNF.  Admitted 1/25-1/27 with bilateral submassive pulmonary emboli. Venous Dopplers were positive for DVT. Echocardiogram demonstrated reduced LV function with an EF of 25-30%. This was felt to be related to strain from pulmonary emboli. He also had mildly elevated troponins. This was felt to be secondary to demand ischemia. He was not followed by cardiology. He was placed on Xarelto for anticoagulation.     Recent Labs  02/12/14 1002 04/11/14 1904 04/11/14 1912 04/11/14 2310 04/12/14 0451 04/12/14 1056  TROPONINI  --  0.17*  --  0.31* 0.22* 0.18*  TROPIPOC 0.00  --  0.19*  --   --   --      He returns for follow-up. Since discharge, he is remaining stable. He is now back at home. He is working with home health PT/OT. He did have an episode of vertigo recently. He saw ENT in follow-up and did not require any further intervention. His vertigo symptoms have resolved. He has chronic chest discomfort from his bypass  surgery. This is somewhat worsened recently with trying to perform transfers on his own. He denies significant dyspnea. He denies PND. He does have chronic LE edema. This is overall stable. He denies syncope or lightheadedness.   Studies/Reports Reviewed Today:  Echocardiogram 04/12/14 - Left ventricle: Mild focal basal hypertrophy of the septum. EF 25-30%. Anteroseptal and apical AK. Grade 1 diastolic dysfunction. - Aortic valve: There was trivial regurgitation. - Ascending aorta: The ascending aorta was mildly dilated. - normal RVF  Chest CTA 04/11/14  IMPRESSION: 1. Large bilateral pulmonary emboli within the main pulmonary arteries, extending distally into branches to all lobes of both lungs. RV/LV ratio 1.0 corresponds to right heart strain, and concern for submassive pulmonary embolus. 2. Lungs clear bilaterally. 3. Diffuse coronary artery calcifications seen.  Carotid US 11/15 Bilateral: mild soft plaque distal CCA and origin ICA. 1-39% ICA  Myoview 4/15 Impression Exercise Capacity:  Good exercise capacity. BP Response:  Normal blood pressure response. Clinical Symptoms:  No significant symptoms noted. ECG Impression:  No significant ST segment change suggestive of ischemia. Comparison with Prior Nuclear Study: No significant change from previous study Overall Impression:  Low risk stress nuclear study Scar anteroapical/ Anterior and ineroapical.  EF 33%  Echo 11/15:  EF 45% Echo 5/13:  EF 38%  Cardiac cath/PCI 4/13 LM:  Ok LAD: ostial 100% LCx: mid 95% RCA:  Ostial 100% L-LAD: ok, LAD with 2 sequential 70% beyond graft S-AM: ostial  100% S-dRCA/PDA:  Ok, RCA beyond graft 80% PCI:  4.0 x 18 mm Resolute DES stent post dilated to 4.5 mm to LCx   Past Medical History  Diagnosis Date  . Coronary artery disease     stent, then CABG 07/20/10  . MI (myocardial infarction)     NSTEMI- last cath 06/2011-Stent to LCX-DES, last nuc 07/30/11 low risk  . Diabetes mellitus      hx of, diet controlled  . Hypertension   . Hyperlipidemia   . Obesity (BMI 30-39.9)   . OSA on CPAP     since July 2013  . H/O cardiomyopathy     ischemic, now last echo 07/30/11, EF 38%W  . Diverticulosis   . Internal hemorrhoids   . Tubular adenoma of colon   . Plantar fasciitis of left foot   . Stroke     Past Surgical History  Procedure Laterality Date  . Coronary artery bypass graft  07/20/2010     LIMA-LAD; VG-ACUTE MARG of RCA; Seq VG-distal RCA & then pda  . Coronary angioplasty with stent placement  07/01/2011    DES-Resolute to native LCX  . Coronary angioplasty with stent placement  12/01/2007    COMPLEX 5 LESION PCI INCLUDING CUTTING BALLOON AND 4 CYPHER DESs  . Hernia repair  2006  . Knee surgery Right   . Hand surgery      to take glass out  . Colonoscopy N/A 02/10/2013    Procedure: COLONOSCOPY;  Surgeon: Ladene Artist, MD;  Location: [facility] ENDOSCOPY;  Service: Endoscopy;  Laterality: N/A;  . Retinal detachment surgery    . Loop recorder implant  02/15/2014    MDT LINQ implanted by Dr Rayann Heman for cryptogenic stroke  . Tee without cardioversion N/A 02/15/2014    Procedure: TRANSESOPHAGEAL ECHOCARDIOGRAM (TEE);  Surgeon: Josue Hector, MD;  Location: [facility] ENDOSCOPY;  Service: Cardiovascular;  Laterality: N/A;  . Left heart catheterization with coronary/graft angiogram  07/01/2011    Procedure: LEFT HEART CATHETERIZATION WITH Beatrix Fetters;  Surgeon: Sanda Klein, MD;  Location: [facility] CATH LAB;  Service: Cardiovascular;;  . Percutaneous coronary stent intervention (pci-s) Right 07/01/2011    Procedure: PERCUTANEOUS CORONARY STENT INTERVENTION (PCI-S);  Surgeon: Sanda Klein, MD;  Location: [facility] CATH LAB;  Service: Cardiovascular;  Laterality: Right;     Current Outpatient Prescriptions  Medication Sig Dispense Refill  . acetaminophen (TYLENOL) 325 MG tablet Take 2 tablets (650 mg total) by mouth every 6 (six) hours as needed for mild pain.    Marland Kitchen ALPRAZolam (XANAX)  0.25 MG tablet Take 0.5 tablets (0.125 mg total) by mouth every 8 (eight) hours as needed for anxiety. 45 tablet 5  . aspirin EC 81 MG EC tablet Take 1 tablet (81 mg total) by mouth daily.    Marland Kitchen atorvastatin (LIPITOR) 80 MG tablet Take 1 tablet (80 mg total) by mouth daily at 6 PM.    . baclofen (LIORESAL) 10 MG tablet Take 1 tablet (10 mg total) by mouth 4 (four) times daily. 120 each 11  . carvedilol (COREG) 3.125 MG tablet Take 3.125 mg by mouth 2 (two) times daily with a meal.    . colchicine 0.6 MG tablet Take 0.6 mg by mouth as needed (for gout).   0  . fluticasone (FLONASE) 50 MCG/ACT nasal spray Place 2 sprays into both nostrils daily.  2  . furosemide (LASIX) 40 MG tablet Take 40 mg by mouth as needed for edema.    . gabapentin (NEURONTIN) 100 MG  capsule Take 100 mg by mouth 2 (two) times daily. Bed-time    . meclizine (ANTIVERT) 25 MG tablet Take 1 tablet (25 mg total) by mouth 3 (three) times daily as needed for dizziness. (Patient taking differently: Take 25 mg by mouth 3 (three) times daily as needed for dizziness (bed-time). ) 30 tablet 0  . Menthol-Methyl Salicylate (MUSCLE RUB) 10-15 % CREA Apply 1 application topically 2 (two) times daily. To left ankle and left shoulder  0  . methylphenidate (RITALIN) 10 MG tablet Take 10 mg by mouth 2 (two) times daily. With breakfast or lunch    . methylphenidate (RITALIN) 5 MG tablet Take one tablet by mouth twice daily with breakfast and lunch 60 tablet 0  . nitroGLYCERIN (NITROSTAT) 0.4 MG SL tablet Place 0.4 mg under the tongue every 5 (five) minutes as needed. For chest pain    . ondansetron (ZOFRAN ODT) 4 MG disintegrating tablet Take 1 tablet (4 mg total) by mouth every 8 (eight) hours as needed for nausea or vomiting. 15 tablet 0  . OVER THE COUNTER MEDICATION Inhale 1 application into the lungs at bedtime. C-PAP    . pantoprazole (PROTONIX) 40 MG tablet Take 1 tablet (40 mg total) by mouth 2 (two) times daily.    . protein supplement  (RESOURCE BENEPROTEIN) POWD Take 1 scoop by mouth 3 (three) times daily with meals.    . ranolazine (RANEXA) 500 MG 12 hr tablet Take 500 mg by mouth 2 (two) times daily.    . rivaroxaban (XARELTO) 20 MG TABS tablet Take 1 tablet (20 mg total) by mouth daily with supper. 30 tablet 5  . rivaroxaban (XARELTO) 20 MG TABS tablet Take 1 tablet (20 mg total) by mouth daily with supper. 30 tablet 11  . Vitamin D, Ergocalciferol, (DRISDOL) 50000 UNITS CAPS capsule Take 50,000 Units by mouth every 7 (seven) days. Take on Mondays     No current facility-administered medications for this visit.    Allergies:   Diphenhydramine hcl    Social History:  The patient  reports that he has quit smoking. He has never used smokeless tobacco. He reports that he drinks alcohol. He reports that he does not use illicit drugs.   Family History:  The patient's family history includes Brain cancer in his maternal grandmother; CAD (age of onset: 35) in his father; COPD in his paternal grandfather; Coronary artery disease in an other family member; Diabetes in his brother and mother; Diabetes type II in his mother; Heart attack (age of onset: 38) in his father; Hyperlipidemia in his brother; Hypertension in his brother; Other in an other family member. There is no history of Colon cancer or Stroke.    ROS:   Please see the history of present illness.   Review of Systems  Constitution: Positive for malaise/fatigue and weight loss.  HENT: Positive for headaches and hearing loss.   Eyes: Positive for visual disturbance.  Respiratory: Positive for snoring and wheezing.   Gastrointestinal: Positive for constipation, nausea and vomiting.  Neurological: Positive for loss of balance and vertigo.  All other systems reviewed and are negative.     PHYSICAL EXAM: VS:  BP 111/80 mmHg  Pulse 82  Ht 6' (1.829 m)  Wt 297 lb (134.718 kg)  BMI 40.27 kg/m2    Wt Readings from Last 3 Encounters:  05/12/14 297 lb (134.718 kg)    05/03/14 300 lb 6.4 oz (136.261 kg)  04/13/14 303 lb 11.2 oz (137.757 kg)  GEN: Well nourished, well developed, in no acute distress HEENT: normal Neck: no JVD at 90 degrees, no masses Cardiac:  Normal S1/S2, RRR; no murmur, no rubs or gallops, trace edema  Respiratory:  clear to auscultation bilaterally, no wheezing, rhonchi or rales. GI: soft, nontender, nondistended, + BS MS: no deformity or atrophy Skin: warm and dry  Neuro:  CNs II-XII intact, LUE and LLE weakness noted Psych: Normal affect   EKG:  EKG is ordered today.  It demonstrates:   NSR, HR 82, LAD, lat TW changes, ant-septal Q waves, no change from prior tracing.    Recent Labs: 04/11/2014: B Natriuretic Peptide 103.3* 04/12/2014: Magnesium 2.1 05/03/2014: ALT 32; BUN 13; Creatinine 1.22; Hemoglobin 15.4; Platelets 205; Potassium 3.7; Sodium 137    Lipid Panel    Component Value Date/Time   CHOL 181 02/13/2014 0537   TRIG 105 02/13/2014 0537   TRIG 113 01/04/2013 1206   HDL 40 02/13/2014 0537   CHOLHDL 4.5 02/13/2014 0537   VLDL 21 02/13/2014 0537   LDLCALC 120* 02/13/2014 0537   LDLCALC 129* 01/04/2013 1206      ASSESSMENT AND PLAN:  1.  CAD s/p CABG:  No clear angina.  His EF worsened on recent echo in the setting of submassive bilateral pulmonary emboli.  He also had mild Troponin elevation.  This was all likely related to strain and demand ischemia.  He will need reassessment of his EF at some point.  If his EF does not return to baseline, he may need ischemic evaluation.  However, he is not a candidate for cardiac cath at this point given recent CVA and need for uninterrupted anticoagulation with recent PE.  Continue ASA, beta blocker, statin.  2.  Ischemic Cardiomyopathy:  As noted his EF has reduced to 25-30% on recent echo.  BP is too soft at this point to resume ACEI. He has a tendency to have low BP and tells me that he has passed out in the past with higher doses of anti-HTN medications.  It would  be ideal to resume low dose ACEI at some point.  He has FU with Dr. Shelva Majestic in 4 weeks.  I will defer timing of his FU echo to Dr. Claiborne Billings. 3.  Chronic Systolic CHF:  Volume stable.  He takes prn Lasix.   4.  Pulmonary Emboli:  He is adherent with Xarelto.  This was a provoked PE but he had large bilateral emboli.  Given his immobility from his recent stroke I would anticipate he remains high risk for recurrent PE.  He should probably remain on Xarelto for 6-12 months.    5.  Hx of CVA:   He has residual L sided weakness.  FU with Neuro as planned. Recent ILR interrogation without AFib. 6.  HTN:  Controlled.  7.  Hyperlipidemia:  Continue statin. 8.  OSA:  His adherence with CPAP is sporadic.   Current medicines are reviewed at length with the patient today.  The patient does not have concerns regarding medicines.  The following changes have been made:  no change  Labs/ tests ordered today include:   Orders Placed This Encounter  Procedures  . EKG 12-Lead     Disposition:   FU with Dr. Shelva Majestic 06/17/14 as planned.     Signed, Versie Starks, MHS 05/12/2014 10:29 AM    [facility] [address], [place], [place]  [address] Phone: [phone]; Fax: [phone]

## 2014-05-13 ENCOUNTER — Other Ambulatory Visit: Payer: Self-pay | Admitting: *Deleted

## 2014-05-13 MED ORDER — CARVEDILOL 3.125 MG PO TABS: 3.1250 mg | ORAL_TABLET | Freq: Two times a day (BID) | ORAL | Status: DC

## 2014-05-13 MED ORDER — RANOLAZINE ER 500 MG PO TB12: 500.0000 mg | ORAL_TABLET | Freq: Two times a day (BID) | ORAL | Status: DC

## 2014-05-16 ENCOUNTER — Telehealth: Payer: Self-pay | Admitting: Physician Assistant

## 2014-05-16 ENCOUNTER — Ambulatory Visit (INDEPENDENT_AMBULATORY_CARE_PROVIDER_SITE_OTHER): Payer: 59 | Admitting: *Deleted

## 2014-05-16 DIAGNOSIS — I63411 Cerebral infarction due to embolism of right middle cerebral artery: Secondary | ICD-10-CM

## 2014-05-16 MED ORDER — PANTOPRAZOLE SODIUM 40 MG PO TBEC: 40.0000 mg | DELAYED_RELEASE_TABLET | Freq: Two times a day (BID) | ORAL | Status: DC

## 2014-05-16 NOTE — Telephone Encounter (Signed)
s/w pt's [name] that I s/w pharmacy and lipitor was sent in however; the protonix was never sent over. I stated that I called in the protonix today and both Rx will be ready shortly. Wife said thank you.

## 2014-05-16 NOTE — Telephone Encounter (Signed)
New Msg       Pt c/o medication issue:  1. Name of Medication: Lipitor, Protonix  2. How are you currently taking this medication (dosage and times per day)? Lipitor 80 mg, Protonix 40 mg both once daily  3. Are you having a reaction (difficulty breathing--STAT)? No  4. What is your medication issue? Prescriptions haven't been called into pharmacy.  Please return call and confirm that prescriptions will be sent for meds to [facility].

## 2014-05-17 LAB — MDC_IDC_ENUM_SESS_TYPE_REMOTE
Date Time Interrogation Session: 20160206012836
Zone Setting Detection Interval: 2000 ms
Zone Setting Detection Interval: 3000 ms
Zone Setting Detection Interval: 360 ms

## 2014-05-19 ENCOUNTER — Encounter: Payer: Self-pay | Admitting: Internal Medicine

## 2014-05-19 NOTE — Progress Notes (Signed)
Loop recorder 

## 2014-05-23 ENCOUNTER — Telehealth: Payer: Self-pay | Admitting: Cardiovascular Disease

## 2014-05-23 NOTE — Telephone Encounter (Signed)
Pt called, having trouble getting Ranexa filled.  Called [facility], they raised concern over interaction between Ranexa & atorvastatin 80mg .  Spoke to [place], she will defer to Dr. Claiborne Billings to OK statin change.

## 2014-05-23 NOTE — Telephone Encounter (Signed)
Please call,having problem getting his Ranexa.

## 2014-05-24 NOTE — Telephone Encounter (Signed)
LMOM for pt or spouse to call back.  Reviewed with Dr. Claiborne Billings and will switch patient to Crestor 40 mg from simvastatin.

## 2014-05-25 MED ORDER — RANOLAZINE ER 500 MG PO TB12: 500.0000 mg | ORAL_TABLET | Freq: Two times a day (BID) | ORAL | Status: DC

## 2014-05-25 NOTE — Telephone Encounter (Signed)
Spoke with wife. Pt has been on both for several months w/o problems.  Recently refilled atorvastatin.  Advised her to continue thru this bottle, will change rx to Crestor 40 mg at his appt with Dr. Claiborne Billings on April 1.

## 2014-05-27 ENCOUNTER — Telehealth: Payer: Self-pay | Admitting: Neurology

## 2014-05-27 NOTE — Telephone Encounter (Signed)
Wes with [employer] is calling to get order for AFO and for platform for rolling walker. Please fax to [phone].

## 2014-05-27 NOTE — Telephone Encounter (Signed)
Spoke with Wes and gave v/o for afo and platform for walker.  He will fax orders over to be signed/fim

## 2014-06-02 ENCOUNTER — Telehealth: Payer: Self-pay | Admitting: Neurology

## 2014-06-02 NOTE — Telephone Encounter (Signed)
LMOM for Gregory Wiley to call back/fim

## 2014-06-02 NOTE — Telephone Encounter (Signed)
Marlowe Kays, Physical Therapist with Gregory Wiley @ [phone], requesting extended order for Occupational Therapy for 2 x's a week for 2 weeks.  Please call and advise.

## 2014-06-06 NOTE — Telephone Encounter (Signed)
Spoke with Gregory Wiley who is requesting orders to extend OT--twice weekly for 3 more weeks.  She sts. they specifically continue to work on left arm strenth/coordination.  Verbal order per RAS, to extend ot as requested above given/fim

## 2014-06-08 ENCOUNTER — Telehealth: Payer: Self-pay | Admitting: Neurology

## 2014-06-08 DIAGNOSIS — R414 Neurologic neglect syndrome: Secondary | ICD-10-CM

## 2014-06-08 DIAGNOSIS — G8192 Hemiplegia, unspecified affecting left dominant side: Secondary | ICD-10-CM

## 2014-06-08 NOTE — Telephone Encounter (Signed)
Gregory Wiley, Occupational Therapist with [facility] @ [phone], requesting an order sent to [facility] starting 1st of April.  Patient will be discharged from [facility] end of March.  Please call and advise.

## 2014-06-08 NOTE — Telephone Encounter (Signed)
LMOM for Gregory Wiley to call.  An order for what?  Specifically what kind of pt?

## 2014-06-09 NOTE — Telephone Encounter (Signed)
LMOM for Gregory Wiley to call.  I need to know exactly what she needs order to say.  For example: PT for gait/balance training./fim

## 2014-06-09 NOTE — Telephone Encounter (Signed)
Marlowe Kays with [facility] is calling back to state the patient needs an order for Physical Therapy and Occupational Therapy sent to [name]- Fax#[phone].

## 2014-06-09 NOTE — Telephone Encounter (Signed)
Marlowe Kays returned call and stated order should read, PT for Gregory Wiley/balance training, Neuromuscular reagitation,  OT for upper extremity excercises and neuromuscular reagitation.  Please call and advise.

## 2014-06-10 NOTE — Telephone Encounter (Signed)
PT/OT ordered./fim

## 2014-06-13 ENCOUNTER — Other Ambulatory Visit: Payer: Self-pay | Admitting: Neurology

## 2014-06-13 MED ORDER — METHYLPHENIDATE HCL 10 MG PO TABS: 10.0000 mg | ORAL_TABLET | Freq: Two times a day (BID) | ORAL | Status: DC

## 2014-06-13 NOTE — Telephone Encounter (Signed)
Patient's wife is calling to order written Rx methylpheidate 10 mg.  Please call.

## 2014-06-15 ENCOUNTER — Ambulatory Visit (INDEPENDENT_AMBULATORY_CARE_PROVIDER_SITE_OTHER): Payer: 59 | Admitting: *Deleted

## 2014-06-15 ENCOUNTER — Encounter: Payer: Self-pay | Admitting: Internal Medicine

## 2014-06-15 DIAGNOSIS — I63411 Cerebral infarction due to embolism of right middle cerebral artery: Secondary | ICD-10-CM | POA: Diagnosis not present

## 2014-06-16 NOTE — Progress Notes (Signed)
Loop recorder 

## 2014-06-17 ENCOUNTER — Ambulatory Visit (INDEPENDENT_AMBULATORY_CARE_PROVIDER_SITE_OTHER): Payer: 59 | Admitting: Cardiovascular Disease

## 2014-06-17 VITALS — BP 108/76 | HR 79 | Ht 72.0 in | Wt 280.6 lb

## 2014-06-17 DIAGNOSIS — Z7902 Long term (current) use of antithrombotics/antiplatelets: Secondary | ICD-10-CM

## 2014-06-17 DIAGNOSIS — G4733 Obstructive sleep apnea (adult) (pediatric): Secondary | ICD-10-CM

## 2014-06-17 DIAGNOSIS — I25810 Atherosclerosis of coronary artery bypass graft(s) without angina pectoris: Secondary | ICD-10-CM | POA: Diagnosis not present

## 2014-06-17 DIAGNOSIS — R0789 Other chest pain: Secondary | ICD-10-CM | POA: Diagnosis not present

## 2014-06-17 DIAGNOSIS — R195 Other fecal abnormalities: Secondary | ICD-10-CM

## 2014-06-17 DIAGNOSIS — E785 Hyperlipidemia, unspecified: Secondary | ICD-10-CM

## 2014-06-17 DIAGNOSIS — I255 Ischemic cardiomyopathy: Secondary | ICD-10-CM

## 2014-06-17 DIAGNOSIS — I2699 Other pulmonary embolism without acute cor pulmonale: Secondary | ICD-10-CM

## 2014-06-17 DIAGNOSIS — Z79899 Other long term (current) drug therapy: Secondary | ICD-10-CM | POA: Diagnosis not present

## 2014-06-17 DIAGNOSIS — Z9989 Dependence on other enabling machines and devices: Secondary | ICD-10-CM

## 2014-06-17 MED ORDER — ROSUVASTATIN CALCIUM 40 MG PO TABS: 40.0000 mg | ORAL_TABLET | Freq: Every day | ORAL | Status: DC

## 2014-06-17 MED ORDER — LISINOPRIL 2.5 MG PO TABS: 2.5000 mg | ORAL_TABLET | Freq: Every day | ORAL | Status: DC

## 2014-06-17 NOTE — Patient Instructions (Addendum)
Your physician recommends that you return for lab work in: 4 weeks.  A chest x-ray takes a picture of the organs and structures inside the chest, including the heart, lungs, and blood vessels. This test can show several things, including, whether the heart is enlarges; whether fluid is building up in the lungs; and whether pacemaker / defibrillator leads are still in place.  Your physician has recommended you make the following change in your medication: STOP atorvastatin and start Crestor. Start new prescription for lisinopril 2.5 mg these prescriptions have already been sent to your pharmacy.  Your physician recommends that you schedule a follow-up appointment in: 6 weeks with Dr. Claiborne Billings.  You have been referred to Lucio [facility] for evaluation of blood in stool.

## 2014-06-19 ENCOUNTER — Encounter: Payer: Self-pay | Admitting: Cardiovascular Disease

## 2014-06-19 NOTE — Progress Notes (Signed)
Patient ID: Gregory Wiley, male   DOB: 1950/04/15, 64 y.o.   MRN: [id number]      HPI: Gregory Wiley is a 64 y.o. male presents to the office today for an 22 month cardiology evaluation. He is a former patient of Dr. Rex Kras.  [name] has established CAD and underwent initial stenting of his RCA in May 2011. He is s/p CABG surgery with LIMA to the LAD, sequential vein to the distal RCA and PDA, and vein to the acute marginal branch of his right coronary artery. In April 2013 he developed unstable angina and ruled in for non-ST segment elevation myocardial infarction. Catheterization revealed 99% stenosis of the large left circumflex vessel and he underwent successful stenting with insertion of a 4.0x18 mm resolute DES stent post dilated to 4.5 mm.  Additional problems include obstructive sleep apnea. On his initial polysomnogram in 2010 AHI was 11.3 overall, but REM sleep he had moderate sleep apnea with an AHI of 24.8. He never did initiate CPAP therapy at that time. However, since his myocardial infarction he has been using CPAP therapy since July 2013.   He has a history of hyperlipidemia, hypertension, and morbid obesity, but he has noticed some weight loss. An echo Doppler study in May 2013 suggested an ejection fraction of 38% and on nuclear imaging  was 37% with scar in the LAD territory.  Since I last saw him, he was admitted to [facility] hospital in November 2015 with a large right MCA territory CVA.  TEE was negative for embolic source.  EF was 45% on echocardiogram.  He underwent implantable loop recorder by Dr. Rayann Heman.  He was readmitted on general 25 3 gender 4 with bilateral submassive pulmonary embolism.  Venous Dopplers are positive for DVT.  Ejection fraction at that time.  An echo was 25-30%, which is felt to be contributed by strain from his pulmonary emboli.  He had mild troponin elevation which was felt to be due to demand ischemia and has been on Xarelto for anticoagulation.  He  tells me that he is recently noticed some blood in his stool.  He had seen Richardson Dopp for follow-up evaluation.  In February 2016.  Blood pressure was low at that time and he was not started on Ace inhibition.  He presents for follow-up evaluation.  Past Medical History  Diagnosis Date  . Coronary artery disease     stent, then CABG 07/20/10  . MI (myocardial infarction)     NSTEMI- last cath 06/2011-Stent to LCX-DES, last nuc 07/30/11 low risk  . Diabetes mellitus     hx of, diet controlled  . Hypertension   . Hyperlipidemia   . Obesity (BMI 30-39.9)   . OSA on CPAP     since July 2013  . H/O cardiomyopathy     ischemic, now last echo 07/30/11, EF 38%W  . Diverticulosis   . Internal hemorrhoids   . Tubular adenoma of colon   . Plantar fasciitis of left foot   . Stroke     Past Surgical History  Procedure Laterality Date  . Coronary artery bypass graft  07/20/2010     LIMA-LAD; VG-ACUTE MARG of RCA; Seq VG-distal RCA & then pda  . Coronary angioplasty with stent placement  07/01/2011    DES-Resolute to native LCX  . Coronary angioplasty with stent placement  12/01/2007    COMPLEX 5 LESION PCI INCLUDING CUTTING BALLOON AND 4 CYPHER DESs  . Hernia repair  2006  .  Knee surgery Right   . Hand surgery      to take glass out  . Colonoscopy N/A 02/10/2013    Procedure: COLONOSCOPY;  Surgeon: Ladene Artist, MD;  Location: [facility] ENDOSCOPY;  Service: Endoscopy;  Laterality: N/A;  . Retinal detachment surgery    . Loop recorder implant  02/15/2014    MDT LINQ implanted by Dr Rayann Heman for cryptogenic stroke  . Tee without cardioversion N/A 02/15/2014    Procedure: TRANSESOPHAGEAL ECHOCARDIOGRAM (TEE);  Surgeon: Josue Hector, MD;  Location: [facility] ENDOSCOPY;  Service: Cardiovascular;  Laterality: N/A;  . Left heart catheterization with coronary/graft angiogram  07/01/2011    Procedure: LEFT HEART CATHETERIZATION WITH Beatrix Fetters;  Surgeon: Sanda Klein, MD;  Location: [facility] CATH LAB;   Service: Cardiovascular;;  . Percutaneous coronary stent intervention (pci-s) Right 07/01/2011    Procedure: PERCUTANEOUS CORONARY STENT INTERVENTION (PCI-S);  Surgeon: Sanda Klein, MD;  Location: [facility] CATH LAB;  Service: Cardiovascular;  Laterality: Right;    Allergies  Allergen Reactions  . Diphenhydramine Hcl Anaphylaxis    Current Outpatient Prescriptions  Medication Sig Dispense Refill  . acetaminophen (TYLENOL) 325 MG tablet Take 2 tablets (650 mg total) by mouth every 6 (six) hours as needed for mild pain.    Marland Kitchen ALPRAZolam (XANAX) 0.25 MG tablet Take 0.5 tablets (0.125 mg total) by mouth every 8 (eight) hours as needed for anxiety. 45 tablet 5  . aspirin 81 MG EC tablet Take 1 tablet (81 mg total) by mouth daily. 30 tablet 6  . carvedilol (COREG) 3.125 MG tablet Take 1 tablet (3.125 mg total) by mouth 2 (two) times daily with a meal. 180 tablet 2  . colchicine 0.6 MG tablet Take 0.6 mg by mouth as needed (for gout).   0  . fluticasone (FLONASE) 50 MCG/ACT nasal spray Place 2 sprays into both nostrils daily.  2  . furosemide (LASIX) 40 MG tablet Take 40 mg by mouth as needed for edema.    . gabapentin (NEURONTIN) 100 MG capsule Take 100 mg by mouth 2 (two) times daily. Bed-time    . meclizine (ANTIVERT) 25 MG tablet Take 1 tablet (25 mg total) by mouth 3 (three) times daily as needed for dizziness. (Patient taking differently: Take 25 mg by mouth 3 (three) times daily as needed for dizziness (bed-time). ) 30 tablet 0  . Melatonin 10 MG CAPS Take 1 capsule by mouth at bedtime.    . methylphenidate (RITALIN) 10 MG tablet Take 1 tablet (10 mg total) by mouth 2 (two) times daily. With breakfast and lunch 60 tablet 0  . nitroGLYCERIN (NITROSTAT) 0.4 MG SL tablet Place 0.4 mg under the tongue every 5 (five) minutes as needed. For chest pain    . ondansetron (ZOFRAN ODT) 4 MG disintegrating tablet Take 1 tablet (4 mg total) by mouth every 8 (eight) hours as needed for nausea or vomiting. 15  tablet 0  . OVER THE COUNTER MEDICATION Inhale 1 application into the lungs at bedtime. C-PAP    . pantoprazole (PROTONIX) 40 MG tablet Take 1 tablet (40 mg total) by mouth 2 (two) times daily. 60 tablet 6  . protein supplement (RESOURCE BENEPROTEIN) POWD Take 1 scoop by mouth 3 (three) times daily with meals.    . ranolazine (RANEXA) 500 MG 12 hr tablet Take 1 tablet (500 mg total) by mouth 2 (two) times daily. 180 tablet 1  . rivaroxaban (XARELTO) 20 MG TABS tablet Take 1 tablet (20 mg total) by mouth daily with  supper. 30 tablet 11  . Sennosides (SENOKOT PO) Take 2 tablets by mouth daily.    Marland Kitchen tiZANidine (ZANAFLEX) 2 MG tablet Take 1 tablet by mouth daily.    . Vitamin D, Ergocalciferol, (DRISDOL) 50000 UNITS CAPS capsule Take 50,000 Units by mouth every 7 (seven) days. Take on Mondays    . lisinopril (PRINIVIL,ZESTRIL) 2.5 MG tablet Take 1 tablet (2.5 mg total) by mouth daily. 90 tablet 3  . rosuvastatin (CRESTOR) 40 MG tablet Take 1 tablet (40 mg total) by mouth daily. 90 tablet 3   No current facility-administered medications for this visit.    History   Social History  . Marital Status: Married    Spouse Name: N/A  . Number of Children: 5  . Years of Education: N/A   Occupational History  .      works at the [employer] [place] Topics  . Smoking status: Former Research scientist (life sciences)  . Smokeless tobacco: Never Used  . Alcohol Use: Yes     Comment: 1-2 times per year  . Drug Use: No  . Sexual Activity: Not on file   Other Topics Concern  . 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 Diabetes type II Mother   . Coronary artery disease    . Colon cancer Neg Hx   . CAD Father 67    CABG, PCI, died at 31  . Heart attack Father 41  . Hypertension Brother   . Diabetes Brother   . Hyperlipidemia  Brother   . Brain cancer Maternal Grandmother   . COPD Paternal Grandfather   . Other      denies family history of DVT/PE  . Stroke Neg Hx   . Diabetes Mother    Additional social history is notable in that he is married has 5 children 4 grandchildren. He does try to walk. There is no tobacco or alcohol use.  ROS General: Negative; No fevers, chills, or night sweats;  HEENT: Negative; No changes in vision or hearing, sinus congestion, difficulty swallowing Pulmonary: Negative; No cough, wheezing, shortness of breath, hemoptysis Cardiovascular: see HPI GI: Negative; No nausea, vomiting, diarrhea, or abdominal pain GU: Negative; No dysuria, hematuria, or difficulty voiding Musculoskeletal: Negative; no myalgias, joint pain, or weakness Hematologic/Oncology: Negative; no easy bruising, bleeding Endocrine: Negative; no heat/cold intolerance; no diabetes Neuro: Right MCA stroke with residual left-sided weakness Skin: Negative; No rashes or skin lesions Psychiatric: Negative; No behavioral problems, depression Sleep: Positive for sleep apnea on CPAP; No snoring, daytime sleepiness, hypersomnolence, bruxism, restless legs, hypnogognic hallucinations, no cataplexy Other comprehensive 14 point system review is negative.  PE BP 108/76 mmHg  Pulse 79  Ht 6' (1.829 m)  Wt 280 lb 9.6 oz (127.279 kg)  BMI 38.05 kg/m2  General: Alert, oriented, no distress.  Skin: normal turgor, no rashes HEENT: Normocephalic, atraumatic. Pupils round and reactive; sclera anicteric;no lid lag.  Nose without nasal septal hypertrophy Mouth/Parynx benign; Mallinpatti scale 3/4 Neck: Thick neck; No JVD, no carotid briuts Lungs: clear to ausculatation and percussion; no wheezing or rales Heart: RRR, s1 s2 normal 1/6 sem; no diastolic murmur.  No rubs thrills or heaves. Abdomen: soft, nontender; no hepatosplenomehaly, BS+; abdominal aorta nontender and not dilated by palpation. Pulses 2+ Extremities:  no  clubbing cyanosis or edema, Homan's sign negative  Neurologic: Residual hemiparesis on the left. Psychologic: normal affect and mood.  ECG (independently read by me): Normal sinus rhythm at 79 bpm.  Old anteroseptal MI with QS complex V1 through V3.  LVH by voltage criteria in aVL.  Increased QTc interval 488 ms.  ECG: Normal sinus rhythm at 60. QS complex in V1 through V3 concordant with his documented LAD region scar. T-wave inversion in leads 1 and avL unchanged.    LABS:  BMET  BMP Latest Ref Rng 05/03/2014 04/12/2014 04/11/2014  Glucose 70 - 99 mg/dL 110(H) 106(H) 126(H)  BUN 6 - 23 mg/dL $Remove'13 10 11  'vxEUeVn$ Creatinine 0.50 - 1.35 mg/dL 1.22 1.01 0.83  Sodium 135 - 145 mmol/L 137 141 137  Potassium 3.5 - 5.1 mmol/L 3.7 3.4(L) 4.1  Chloride 96 - 112 mmol/L 102 107 105  CO2 19 - 32 mmol/L $RemoveB'24 25 21  'xoUzhCLH$ Calcium 8.4 - 10.5 mg/dL 9.4 8.9 9.1     Hepatic Function Panel    Hepatic Function Latest Ref Rng 05/03/2014 04/12/2014 02/16/2014  Total Protein 6.0 - 8.3 g/dL 8.4(H) 6.8 7.2  Albumin 3.5 - 5.2 g/dL 4.0 3.2(L) 3.2(L)  AST 0 - 37 U/L $Remo'24 27 21  'dRpIh$ ALT 0 - 53 U/L 32 43 16  Alk Phosphatase 39 - 117 U/L 87 73 73  Total Bilirubin 0.3 - 1.2 mg/dL 1.0 0.7 0.7    CBC  CBC Latest Ref Rng 05/03/2014 04/13/2014 04/12/2014  WBC 4.0 - 10.5 K/uL 8.1 7.9 9.0  Hemoglobin 13.0 - 17.0 g/dL 15.4 13.2 13.6  Hematocrit 39.0 - 52.0 % 46.2 40.9 43.3  Platelets 150 - 400 K/uL 205 178 193     BNP    Component Value Date/Time   PROBNP 1224.0* 06/30/2011 0202    Lipid Panel     Component Value Date/Time   CHOL 181 02/13/2014 0537   CHOL 199 01/04/2013 1206   TRIG 105 02/13/2014 0537   TRIG 113 01/04/2013 1206   HDL 40 02/13/2014 0537   HDL 47 01/04/2013 1206   CHOLHDL 4.5 02/13/2014 0537   VLDL 21 02/13/2014 0537   LDLCALC 120* 02/13/2014 0537   LDLCALC 129* 01/04/2013 1206     RADIOLOGY: No results found.    ASSESSMENT AND PLAN: Mr. Manheim is a 64 year old gentleman with a history of morbid  obesity, mild hypertension, known coronary artery disease who is status post initial stenting of his right coronary artery in May 2011 with subsequent CABG surgery. In April 2013 he was found to have subtotal stenosis in a very large circumflex vessel and underwent successful insertion of a large Resolute DES stent. His last nuclear perfusion study was in May 2013 which revealed an ejection fraction approximately 37%.  Since I saw him 18 months ago, he has suffered a large right MCA church or CVA.  He has implantable loop recorder to assess for potential atrial fibrillation.  He subsequently suffered bilateral submassive pulmonary emboli and has developed a cardiomyopathy with a drop in ejection fraction to 25-30% noted at that time.  His blood pressure remains low, but I feel it may be worthwhile to try initiating very low-dose lisinopril.  Initially, a 2.5 mg at bedtime.  I'm scheduling him for PA and lateral chest x-ray.  Since he is on Ranexa 500 mg twice a day, the current recommended maximum dose of atorvastatin.  His 40 mg.  Apparently he has been taking 80 mg.  I will discontinue  atorvastatin and will switch him to Crestor 40 mg daily.  Follow-up laboratory will be obtained in 6 weeks with a chemistry profile, lipid panel and CBC.  I am also referring him to Dr. Lucio Edward whom he has seen in the past, particularly with his history of recent blood in his stool on anticoagulation therapy.  I will see him in 4-6 weeks for follow-up evaluation.     [name], MD, [facility]  06/19/2014 9:19 PM

## 2014-06-21 ENCOUNTER — Ambulatory Visit
Admission: RE | Admit: 2014-06-21 | Discharge: 2014-06-21 | Disposition: A | Discharge status: Home / Self Care | Payer: 59 | Source: Ambulatory Visit | Attending: Cardiovascular Disease | Admitting: Cardiovascular Disease

## 2014-06-21 ENCOUNTER — Other Ambulatory Visit: Payer: Self-pay | Admitting: Cardiovascular Disease

## 2014-06-21 DIAGNOSIS — I25810 Atherosclerosis of coronary artery bypass graft(s) without angina pectoris: Secondary | ICD-10-CM

## 2014-06-21 DIAGNOSIS — R0789 Other chest pain: Secondary | ICD-10-CM

## 2014-06-21 MED ORDER — ROSUVASTATIN CALCIUM 40 MG PO TABS: 40.0000 mg | ORAL_TABLET | Freq: Every day | ORAL | Status: DC

## 2014-06-21 NOTE — Telephone Encounter (Signed)
°  1. Which medications need to be refilled? Crestor-need this until his mail order comes in  2. Which pharmacy is medication to be sent to? [facility] [phone] 3. Do they need a 30 day or 90 day supply? 30  4. Would they like a call back once the medication has been sent to the pharmacy? yes

## 2014-06-21 NOTE — Telephone Encounter (Signed)
Rx(s) sent to pharmacy electronically.  

## 2014-07-11 LAB — MDC_IDC_ENUM_SESS_TYPE_REMOTE

## 2014-07-15 ENCOUNTER — Ambulatory Visit (INDEPENDENT_AMBULATORY_CARE_PROVIDER_SITE_OTHER): Payer: 59 | Admitting: *Deleted

## 2014-07-15 DIAGNOSIS — I63411 Cerebral infarction due to embolism of right middle cerebral artery: Secondary | ICD-10-CM

## 2014-07-19 ENCOUNTER — Encounter: Payer: Self-pay | Admitting: Occupational Therapy

## 2014-07-19 ENCOUNTER — Ambulatory Visit: Payer: 59 | Attending: Neurology | Admitting: Occupational Therapy

## 2014-07-19 ENCOUNTER — Ambulatory Visit: Payer: 59

## 2014-07-19 ENCOUNTER — Other Ambulatory Visit: Payer: Self-pay | Admitting: Neurology

## 2014-07-19 VITALS — BP 119/88 | HR 94

## 2014-07-19 DIAGNOSIS — G8110 Spastic hemiplegia affecting unspecified side: Secondary | ICD-10-CM | POA: Diagnosis not present

## 2014-07-19 DIAGNOSIS — Z7409 Other reduced mobility: Secondary | ICD-10-CM | POA: Diagnosis not present

## 2014-07-19 DIAGNOSIS — M25512 Pain in left shoulder: Secondary | ICD-10-CM | POA: Insufficient documentation

## 2014-07-19 DIAGNOSIS — R531 Weakness: Secondary | ICD-10-CM | POA: Diagnosis not present

## 2014-07-19 DIAGNOSIS — G8192 Hemiplegia, unspecified affecting left dominant side: Secondary | ICD-10-CM

## 2014-07-19 DIAGNOSIS — R1314 Dysphagia, pharyngoesophageal phase: Secondary | ICD-10-CM

## 2014-07-19 DIAGNOSIS — R262 Difficulty in walking, not elsewhere classified: Secondary | ICD-10-CM

## 2014-07-19 DIAGNOSIS — I63411 Cerebral infarction due to embolism of right middle cerebral artery: Secondary | ICD-10-CM

## 2014-07-19 DIAGNOSIS — R29898 Other symptoms and signs involving the musculoskeletal system: Secondary | ICD-10-CM

## 2014-07-19 NOTE — Therapy (Signed)
[facility] [address] [place], [place], [address] Phone: [phone]   Fax:  [phone]  Physical Therapy Evaluation  Patient Details  Name: Gregory Wiley MRN: [id number] Date of Birth: [birth year] Referring Provider:  Britt Bottom, MD  Encounter Date: 07/19/2014      PT End of Session - 07/19/14 1433    Visit Number 1   Number of Visits 17   Date for PT Re-Evaluation 09/17/14   Authorization Type U[employer], but pt reported he should get Medicare next week. PT assigned G-codes in case he qualifies for Medicare.   PT Start Time [id number]   PT Stop Time 1014   PT Time Calculation (min) 41 min   Equipment Utilized During Treatment Gait belt   Activity Tolerance Patient tolerated treatment well   Behavior During Therapy --  pt did have one episode of anger, during MMT but quickly recovered      Past Medical History  Diagnosis Date  . Coronary artery disease     stent, then CABG 07/20/10  . MI (myocardial infarction)     NSTEMI- last cath 06/2011-Stent to LCX-DES, last nuc 07/30/11 low risk  . Diabetes mellitus     hx of, diet controlled  . Hypertension   . Hyperlipidemia   . Obesity (BMI 30-39.9)   . OSA on CPAP     since July 2013  . H/O cardiomyopathy     ischemic, now last echo 07/30/11, EF 38%W  . Diverticulosis   . Internal hemorrhoids   . Tubular adenoma of colon   . Plantar fasciitis of left foot   . Stroke     Past Surgical History  Procedure Laterality Date  . Coronary artery bypass graft  07/20/2010     LIMA-LAD; VG-ACUTE MARG of RCA; Seq VG-distal RCA & then pda  . Coronary angioplasty with stent placement  07/01/2011    DES-Resolute to native LCX  . Coronary angioplasty with stent placement  12/01/2007    COMPLEX 5 LESION PCI INCLUDING CUTTING BALLOON AND 4 CYPHER DESs  . Hernia repair  2006  . Knee surgery Right   . Hand surgery      to take glass out  . Colonoscopy N/A 02/10/2013    Procedure:  COLONOSCOPY;  Surgeon: Ladene Artist, MD;  Location: [facility] ENDOSCOPY;  Service: Endoscopy;  Laterality: N/A;  . Retinal detachment surgery    . Loop recorder implant  02/15/2014    MDT LINQ implanted by Dr Rayann Heman for cryptogenic stroke  . Tee without cardioversion N/A 02/15/2014    Procedure: TRANSESOPHAGEAL ECHOCARDIOGRAM (TEE);  Surgeon: Josue Hector, MD;  Location: [facility] ENDOSCOPY;  Service: Cardiovascular;  Laterality: N/A;  . Left heart catheterization with coronary/graft angiogram  07/01/2011    Procedure: LEFT HEART CATHETERIZATION WITH Beatrix Fetters;  Surgeon: Sanda Klein, MD;  Location: [facility] CATH LAB;  Service: Cardiovascular;;  . Percutaneous coronary stent intervention (pci-s) Right 07/01/2011    Procedure: PERCUTANEOUS CORONARY STENT INTERVENTION (PCI-S);  Surgeon: Sanda Klein, MD;  Location: [facility] C[facility] LAB;  Service: Cardiovascular;  Laterality: Right;    There were no vitals filed for this visit.  Visit Diagnosis:  Difficulty walking - Plan: PT plan of care cert/re-cert  Left leg weakness - Plan: PT plan of care cert/re-cert      Subjective Assessment - 07/19/14 0946    Subjective L-sided weakness, impaired balance, difficulty walking, L hand N/T (intermittent)   Patient is accompained by: Family member  [name]  Pertinent History MI, CABG, DM, CA   Patient Stated Goals walk better and longer distances   Currently in Pain? Yes   Pain Score 1    Pain Location Buttocks   Pain Orientation Left;Right   Pain Descriptors / Indicators Aching   Pain Type Chronic pain   Pain Onset More than a month ago   Pain Frequency Intermittent   Aggravating Factors  sitting in w/c   Pain Relieving Factors standing            [facility] PT Assessment - 07/19/14 0949    Assessment   Medical Diagnosis L hemiplegia affecting L dominant side, L-sided neglect   Onset Date 02/11/14   Prior Therapy PT in hospital, SNF, and HHPT after 01/2014 CVA   Precautions   Precautions Fall    Restrictions   Weight Bearing Restrictions No   Balance Screen   Has the patient fallen in the past 6 months Yes   How many times? 3   Has the patient had a decrease in activity level because of a fear of falling?  Yes   Is the patient reluctant to leave their home because of a fear of falling?  No   Home Environment   Living Enviornment Private residence   Living Arrangements Spouse/significant other  [name]   Available Help at Discharge Family   Type of [place] One level   Home Equipment Wheelchair - power;Wheelchair - manual;Cane - quad;Walker - 2 wheels;Cane - single point;Tub bench;Bedside commode   Prior Function   Level of Independence Independent with basic ADLs;Independent with gait;Independent with transfers   Vocation Retired   [employer] used to work in Producer, television/film/video, driving, singing, play guitar, worked as Restaurant manager, fast food   Attention Alternating   Alternating Attention Impaired   Awareness Impaired   Observation/Other Assessments   Focus on Therapeutic Outcomes (FOTO)  SIS-mobility: 13.9%   Sensation   Light Touch Appears Intact   Coordination   Gross Motor Movements are Fluid and Coordinated No   Fine Motor Movements are Fluid and Coordinated No   Coordination and Movement Description L side coordination impaired   Posture/Postural Control   Posture/Postural Control Postural limitations   Postural Limitations Rounded Shoulders;Forward head;Posterior pelvic tilt   Tone   Assessment Location Left Lower Extremity   ROM / Strength   AROM / PROM / Strength AROM;Strength   AROM   Overall AROM  Deficits   Overall AROM Comments R UE/LE WFL. L hip flexion and dorsiflexion 0 degrees of AROM.   Strength   Overall Strength Deficits   Overall Strength Comments R UE/LE WFL. L hip flexion: 0/5, knee flex: 2/5, knee ext: 2/5, ankle dorsiflexion: 0/5, hip abduction: 2/5.   Transfers    Transfers Sit to Stand;Stand to Sit   Sit to Stand 4: Min guard;With upper extremity assist;From chair/3-in-1;4: Min assist  pt required several attempts to obtain standing position   Sit to Stand Details (indicate cue type and reason) min A during one sit to stand, min guard for other 2 sit to stands. cues to use R LE to increase L knee flexion prior to standing.   Stand to Sit 4: Min guard;With upper extremity assist;To chair/3-in-1   Ambulation/Gait   Ambulation/Gait Yes   Ambulation/Gait Assistance 4: Min guard;4: Min assist   Ambulation/Gait Assistance Details min A during 2 LOB episodes.   Ambulation Distance (  Feet) 50 Feet   Assistive device Small based quad cane   Gait Pattern Step-to pattern;Decreased stance time - left;Decreased step length - right;Left circumduction;Left foot flat;Decreased hip/knee flexion - left   Ambulation Surface Level;Indoor   Gait velocity 0.22ft/sec.   Balance   Balance Assessed Yes   Static Standing Balance   Static Standing - Balance Support Right upper extremity supported   Static Standing - Level of Assistance 5: Stand by assistance   Static Standing - Comment/# of Minutes Pt required UE support to maintain static standing with feet apart.   Standardized Balance Assessment   Standardized Balance Assessment Timed Up and Go Test   Timed Up and Go Test   TUG Normal TUG   Normal TUG (seconds) 66.03  with quad cane   LLE Tone   LLE Tone Mild                           PT Education - 07/19/14 1433    Education provided Yes   Education Details PT frequency and duration   Person(s) Educated Patient;Spouse   Methods Explanation   Comprehension Verbalized understanding          PT Short Term Goals - 07/19/14 1437    PT SHORT TERM GOAL #1   Title Pt will be independent in HEP to improve strength, balance, endurance, and safety. Target date: 08/16/14.   Status New   PT SHORT TERM GOAL #2   Title Perform BERG and write  goals if appropriate. Target date: 08/16/14.   Status New   PT SHORT TERM GOAL #3   Title Pt will perform sit<>stand with UE assist at MOD I level to improve functional mobilty. Target date: 08/16/14.   Status New   PT SHORT TERM GOAL #4   Title Pt will ambulate 200' over even terrain with LRAD and supervision to improve functional mobility. Target date: 08/16/14.   Status New   PT SHORT TERM GOAL #5   Title Pt will perform TUG in </=90 seconds to reduce falls risk. Target date: 08/16/14.   Status New           PT Long Term Goals - 07/19/14 1440    PT LONG TERM GOAL #1   Title Pt will verbalize understanding of CVA signs/symptoms and risk factors to reduce risk of another CVA. Target date: 09/13/14.   Status New   PT LONG TERM GOAL #2   Title Pt will report no falls over the last four weeks to improve safety during functional mobility. Target date; 09/13/14.   Status New   PT LONG TERM GOAL #3   Title Pt will ambulate 400' over even/uneven terrain with LRAD at MOD I level to improve functional mobililty. Target date: 09/13/14.   Status New   PT LONG TERM GOAL #4   Title Pt's SIS-mobility score will improve by 10% to improve quality of life. Target date: 09/13/14.   Status New   PT LONG TERM GOAL #5   Title Pt's gait speed will improve to >/=1.16ft/sec. with LRAD to reduce falls risk. Target date: 09/13/14.   Status New               Plan - 07/19/14 0941    Clinical Impression Statement Pt is a 64y/o male presenting to [facility] neuro s/p R CVA in 01/2014. Pt received PT in hospital for approx. 1 month, went to SNF and then home with HHPT. Pt presenting  with L sided weakness, impaired balance, and difficulty walking. Pt and his wife report that he has a precription for an AFO from Dr. Felecia Shelling but [facility] won't provide it to pt. Pt also reported one PT stated pt should have a platform for RW and one stated pt did not require it. Pt has fallen 3 times in the last 6 months during ambulation  and during transfers. Pt reported he can ambulate approx. 20' with quad cane. Pt required cues to engage with PT at the beginning of the session, as he was on his phone and his wife had to take the phone away in order for pt to listen. Pt also noted to experience emotional lability during session, but overall, was pleasant. Pt's gait speed and poor balance indicate he is at a risk for falls, and pt has history of falls.   Pt will benefit from skilled therapeutic intervention in order to improve on the following deficits Difficulty walking;Decreased safety awareness;Decreased endurance;Obesity;Decreased knowledge of use of DME;Decreased balance;Decreased mobility;Decreased strength;Impaired UE functional use;Other (comment)  increased tone, orthotic training/fitting   Rehab Potential Good   PT Frequency 2x / week   PT Duration 8 weeks   PT Treatment/Interventions ADLs/Self Care Home Management;Gait training;Neuromuscular re-education;Biofeedback;Functional mobility training;Patient/family education;Cryotherapy;Therapeutic activities;Wheelchair mobility training;Electrical Stimulation;Therapeutic exercise;Manual techniques;DME Instruction;Balance training;Other (comment)  orthotic training/fitting   PT Next Visit Plan Perform BERG if appropriate, initiate strength/balance HEP.   Consulted and Agree with Plan of Care Patient;Family member/caregiver   Family Member Consulted wife-Laura          G-Codes - 2014/07/21 1443    Functional Assessment Tool Used Gait speed 0.50ft/sec with quad cane, TUG with quad cane: 66.03sec.   Functional Limitation Mobility: Walking and moving around   Mobility: Walking and Moving Around Current Status [phone]) At least 60 percent but less than 80 percent impaired, limited or restricted   Mobility: Walking and Moving Around Goal Status [phone]) At least 20 percent but less than 40 percent impaired, limited or restricted       Problem List Patient Active Problem List    Diagnosis Date Noted  . Antiplatelet or antithrombotic long-term use 04/22/2014  . Chest pain 04/17/2014  . SOB (shortness of breath) 04/17/2014  . Bilateral pulmonary embolism 04/11/2014  . Left-sided neglect 03/09/2014  . Dysphagia, pharyngoesophageal phase 03/02/2014  . Arthralgia of left ankle 02/24/2014  . Hemiplegia affecting left dominant side 02/16/2014  . 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 Benign neoplasm of colon 02/10/2013  . Special screening for malignant neoplasms, colon 02/10/2013  . OSA on CPAP 01/06/2013  . S/P angioplasty with DES to CFX 07/01/11 07/02/2011  . Sleep apnea, "cant afford C-pap" 07/02/2011  . CAD, RCA PCI '09, 10/11, CABG X 4 5/12 06/30/2011  . Hypertension, B/P has been low this admission 06/30/2011  . Hyperlipidemia 06/30/2011  . Morbid obesity 06/30/2011  . NSTEMI, 06/29/11 06/30/2011  . 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 Ischemic cardiomyopathy, EF 35-40 2D May 2012 06/30/2011  . CHF, acute, mild 06/30/2011    Tryston Gilliam L 21-Jul-2014, 2:45 PM  [facility] [address] [place], [place], [address] Phone: [phone]   Fax:  [phone]    Geoffry Paradise, PT,DPT Jul 21, 2014 2:45 PM Phone: [phone] Fax: [phone]

## 2014-07-19 NOTE — Therapy (Signed)
[facility] [address] [place], [place], [address] Phone: [phone]   Fax:  [phone]  Occupational Therapy Evaluation  Patient Details  Name: Gregory Wiley MRN: [id number] Date of Birth: [birth year] Referring Provider:  Britt Bottom, MD  Encounter Date: 07/19/2014      OT End of Session - 07/19/14 1054    Visit Number 1   Number of Visits 16   Date for OT Re-Evaluation 09/13/14   Authorization Type [employer]   OT Start Time 0845   OT Stop Time [phone]   OT Time Calculation (min) 46 min   Activity Tolerance Patient tolerated treatment well      Past Medical History  Diagnosis Date  . Coronary artery disease     stent, then CABG 07/20/10  . MI (myocardial infarction)     NSTEMI- last cath 06/2011-Stent to LCX-DES, last nuc 07/30/11 low risk  . Diabetes mellitus     hx of, diet controlled  . Hypertension   . Hyperlipidemia   . Obesity (BMI 30-39.9)   . OSA on CPAP     since July 2013  . H/O cardiomyopathy     ischemic, now last echo 07/30/11, EF 38%W  . Diverticulosis   . Internal hemorrhoids   . Tubular adenoma of colon   . Plantar fasciitis of left foot   . Stroke     Past Surgical History  Procedure Laterality Date  . Coronary artery bypass graft  07/20/2010     LIMA-LAD; VG-ACUTE MARG of RCA; Seq VG-distal RCA & then pda  . Coronary angioplasty with stent placement  07/01/2011    DES-Resolute to native LCX  . Coronary angioplasty with stent placement  12/01/2007    COMPLEX 5 LESION PCI INCLUDING CUTTING BALLOON AND 4 CYPHER DESs  . Hernia repair  2006  . Knee surgery Right   . Hand surgery      to take glass out  . Colonoscopy N/A 02/10/2013    Procedure: COLONOSCOPY;  Surgeon: Ladene Artist, MD;  Location: [facility] ENDOSCOPY;  Service: Endoscopy;  Laterality: N/A;  . Retinal detachment surgery    . Loop recorder implant  02/15/2014    MDT LINQ implanted by Dr Rayann Heman for cryptogenic stroke   . Tee without cardioversion N/A 02/15/2014    Procedure: TRANSESOPHAGEAL ECHOCARDIOGRAM (TEE);  Surgeon: Josue Hector, MD;  Location: [facility] ENDOSCOPY;  Service: Cardiovascular;  Laterality: N/A;  . Left heart catheterization with coronary/graft angiogram  07/01/2011    Procedure: LEFT HEART CATHETERIZATION WITH Beatrix Fetters;  Surgeon: Sanda Klein, MD;  Location: [facility] CATH LAB;  Service: Cardiovascular;;  . Percutaneous coronary stent intervention (pci-s) Right 07/01/2011    Procedure: PERCUTANEOUS CORONARY STENT INTERVENTION (PCI-S);  Surgeon: Sanda Klein, MD;  Location: [facility] CATH LAB;  Service: Cardiovascular;  Laterality: Right;    Filed Vitals:   07/19/14 0851  BP: 119/88  Pulse: 94    Visit Diagnosis:  Spastic hemiplegia, dominant side - Plan: Ot plan of care cert/re-cert  Pain in joint, shoulder region, left - Plan: Ot plan of care cert/re-cert  Generalized weakness - Plan: Ot plan of care cert/re-cert  Impaired functional mobility and activity tolerance - Plan: Ot plan of care cert/re-cert      Subjective Assessment - 07/19/14 0854    Subjective  I want to start walking again and playing guitar and sing in church   Patient is accompained by: Family member  wife   Pertinent History  see epic snapshot, pt with low orthostatic BP, pt with DVT in LLE and PEs - on Zarelto.     Patient Stated Goals walk again, play guitar and sing, wife hopes he gets more use of hand and can do more of his dressing and bathing   Currently in Pain? Yes   Pain Score 9   with movement; at rest no pain   Pain Location Wrist   Pain Orientation Left   Pain Descriptors / Indicators Aching   Pain Type Chronic pain  pt states he fell and thinks he might have broken wrist   Pain Onset 1 to 4 weeks ago   Pain Frequency Intermittent   Aggravating Factors  when he moves it    Pain Relieving Factors not moving it, holding it up   Multiple Pain Sites Yes   Pain Score 1   Pain Location Buttocks    Pain Orientation Right;Left   Pain Descriptors / Indicators Dull   Pain Type Chronic pain   Pain Onset More than a month ago   Pain Frequency Intermittent   Aggravating Factors  sititng in wheelchair   Pain Relieving Factors gettng out of chair           [facility] OT Assessment - 07/19/14 0001    Assessment   Diagnosis R MCA   Onset Date 02/12/15   Prior Therapy PT/OT/ST  in inpatient rehab, then went to SNF and received therapies. Pt d/c home around 04/22/2014   Precautions   Precautions None   Restrictions   Weight Bearing Restrictions No   Balance Screen   Has the patient fallen in the past 6 months Yes   How many times? 2  2 at home and 3 in [place] expects to be discharged to: Private residence   Living Arrangements Spouse/significant other   Available Help at Discharge Available 24 hours/day  leaves pt for short periods of time;  wife has MS   Type of [place] entrance   Bathroom Shower/Tub Tub/Shower unit   Bathroom Accessibility No  pt has to walk a few feet into bathroom   Prior Function   Level of Independence Independent with basic ADLs;Independent with homemaking with ambulation   Vocation Retired   [employer] used to work in Training and development officer   ADL   Eating/Feeding Minimal assistance  min a to cut   Grooming Modified independent   Upper Body Bathing --  min a   Lower Body Bathing Maximal assistance   Upper Body Dressing Minimal assistance   Lower Body Dressing --  total assist   Toilet Tranfer Moderate assistance   Toilet Transfer Method Stand pivot  hangs on to sink no grab bar   Toileting - Clothing Manipulation Maximal assistance   Where Assessed - Toileting Clothing Manipulationn Maximal assistance   Tub/Shower Transfer Moderate assistance  holds towel rack   Tub/Shower Transfer Method Squat pivot   Data processing manager tub bench   ADL comments pt has no grab  bars. Uses sink and towel bar   IADL   Shopping Completely unable to shop  pt used to do shopping before   Light Housekeeping Does not participate in any housekeeping tasks  pt used to laundry   Meal Prep Needs to have meals prepared and served  cooked before and liked to [employer] on family or friends for transportation   Medication  Management Is not capable of dispensing or managing own medication   Financial Management Dependent   Mobility   Mobility Status History of falls   Written Expression   Dominant Hand Left   Handwriting 100% legible  for his name   Vision - History   Baseline Vision Wears glasses only for reading   Vision Assessment   Eye Alignment Within Functional Limits   Ocular Range of Motion Within Functional Limits   Tracking/Visual Pursuits Able to track stimulus in all quads without difficulty   Diplopia Assessment --  pt had double vision initially but has resolved   Comment Pt states everything is blurry   Activity Tolerance   Activity Tolerance Tolerates 10-20 min activity with muiltiple rests   Cognition   Overall Cognitive Status Impaired/Different from baseline  difficulty with math and used to do well with this   Attention Alternating   Alternating Attention Impaired   Memory Impaired  difficulty remembering details   Awareness Impaired   Problem Solving Impaired   Sensation   Light Touch Appears Intact   Hot/Cold Appears Intact   Proprioception Appears Intact   Coordination   Gross Motor Movements are Fluid and Coordinated No   Fine Motor Movements are Fluid and Coordinated No   9 Hole Peg Test --  unable   Box and Blocks --  unable   Edema   Edema mild in R hand if hanging down   Tone   Assessment Location Left Upper Extremity   ROM / Strength   AROM / PROM / Strength AROM;PROM;Strength   AROM   Overall AROM  Deficits   Overall AROM Comments Pt with no true isolatd movement  Pt with some limited elbow flexion  in synergistic pattern   PROM   Overall PROM  Deficits   Overall PROM Comments pt with limited shoulder flexion to about 70* due to malalignment of shoulder girdle. Pt with pain 2/10 at 70* of flexion   Strength   Overall Strength Unable to assess  due to flexor tone   Hand Function   Right Hand Gross Grasp Functional   Left Hand Gross Grasp Impaired  unable to assess due to no AROM   LUE Tone   LUE Tone Moderate                           OT Short Term Goals - 07/19/14 1039    OT SHORT TERM GOAL #1   Title Pt and wife will be mod I with HEP - 08/16/2014   Status New   OT SHORT TERM GOAL #2   Title Pt will report no more then 3/10 pain with PROM of shoulder flexion to at least 90* to assist with self care.   Status New   OT SHORT TERM GOAL #3   Title Pt will require no more than moderate assist for LB dressing   Status New   OT SHORT TERM GOAL #4   Title Pt will require supervision for UB dressing, mod vc's   Status New   OT SHORT TERM GOAL #5   Title Pt will require no more than min a for toilet transfers   Status New   Additional Short Term Goals   Additional Short Term Goals Yes   OT SHORT TERM GOAL #6   Title Pt will require min a to hike pants with toileting   Status New  OT Long Term Goals - 07/19/14 1042    OT LONG TERM GOAL #1   Title Pt and wife will be mod I with upgraded HEP PRN - 09/13/2014   Status New   OT LONG TERM GOAL #2   Title Pt will be min a with LB dressing   Status New   OT LONG TERM GOAL #3   Title Pt will be supervision UB bathing   Status New   OT LONG TERM GOAL #4   Title Pt will be mod a LB bathing   Status New   OT LONG TERM GOAL #5   Title Pt will be min a for transfer tub bench into shower   Status New   OT LONG TERM GOAL #6   Title Pt will require min a for simple hot meal prep at wheelchair level , standing at counter intermittently with close S   Status New   OT LONG TERM GOAL #7   Title Pt will  recall 2 strategies for adapted bathing/dressing   Status New   OT LONG TERM GOAL #8   Title Pt will tolerate 20 minutes of functional activity without rest breaks   Status New               Plan - 07/19/14 1048    Clinical Impression Statement Pt is a 64 year old male s/p R MCA infarct 02/12/2015.  Pt underwent inpatient rehab and then transferred to SNF for more therapies. Pt is now home and presents today with the following deficits that impact his ability to complete ADL's and IADL's:  L dominant spastic heimiplegia, decreased PROM, decreased AROM, pain in L shoulder, decreased functional use of LUE, decreased balance, decreased activity tolerance, impaired cognition, poor safety awareness.  Pt also with recent DVT in LLE and PE's.  Pt has excellent home support and will benefit from skilled OT to maximize independence.    Pt will benefit from skilled therapeutic intervention in order to improve on the following deficits (Retired) Decreased activity tolerance;Decreased balance;Decreased endurance;Decreased cognition;Decreased knowledge of use of DME;Decreased mobility;Decreased range of motion;Decreased safety awareness;Decreased strength;Impaired UE functional use;Impaired tone;Pain   Rehab Potential Good   OT Frequency 2x / week   OT Duration 8 weeks   OT Treatment/Interventions Self-care/ADL training;Moist Heat;Electrical Stimulation;DME and/or AE instruction;Neuromuscular education;Therapeutic exercise;Functional Mobility Training;Manual Therapy;Passive range of motion;Splinting;Therapeutic activities;Balance training;Patient/family education;Cognitive remediation/compensation   Plan initiate HEP   Consulted and Agree with Plan of Care Patient;Family member/caregiver   Family Member Consulted wife        Problem List Patient Active Problem List   Diagnosis Date Noted  . Antiplatelet or antithrombotic long-term use 04/22/2014  . Chest pain 04/17/2014  . SOB (shortness of  breath) 04/17/2014  . Bilateral pulmonary embolism 04/11/2014  . Left-sided neglect 03/09/2014  . Dysphagia, pharyngoesophageal phase 03/02/2014  . Arthralgia of left ankle 02/24/2014  . Hemiplegia affecting left dominant side 02/16/2014  . 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 Benign neoplasm of colon 02/10/2013  . Special screening for malignant neoplasms, colon 02/10/2013  . OSA on CPAP 01/06/2013  . S/P angioplasty with DES to CFX 07/01/11 07/02/2011  . Sleep apnea, "cant afford C-pap" 07/02/2011  . CAD, RCA PCI '09, 10/11, CABG X 4 5/12 06/30/2011  . Hypertension, B/P has been low this admission 06/30/2011  . Hyperlipidemia 06/30/2011  . Morbid obesity 06/30/2011  . NSTEMI, 06/29/11 06/30/2011  . 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 Ischemic  cardiomyopathy, EF 35-40 2D May 2012 06/30/2011  . CHF, acute, mild 06/30/2011    Quay Burow, OTR/L 07/19/2014, 11:03 AM  [facility] [address] [place], [place], [address] Phone: [phone]   Fax:  [phone]

## 2014-07-20 ENCOUNTER — Telehealth: Payer: Self-pay | Admitting: *Deleted

## 2014-07-20 DIAGNOSIS — M25532 Pain in left wrist: Principal | ICD-10-CM

## 2014-07-20 DIAGNOSIS — M25432 Effusion, left wrist: Secondary | ICD-10-CM

## 2014-07-20 DIAGNOSIS — M25542 Pain in joints of left hand: Secondary | ICD-10-CM

## 2014-07-20 NOTE — Telephone Encounter (Signed)
-----   Message from Britt Bottom, MD sent at 07/19/2014  6:22 PM EDT ----- Druscilla Petsch: Please find out from Marlou Sa or his wife which wrist is hurting and we can get x-ray   And refer to Ortho if broken  (I Couldn't order without knowing which side)  Also PT recommends Speech therapy, I put the order in   ----- Message -----    From: Rivka Barbara, OT    Sent: 07/19/2014   5:08 PM      To: Britt Bottom, MD  Dr. Felecia Shelling, I saw this patient of your today and he shared with me that he fell at home a few weeks ago and his wrist has been hurting every since. His wife collaborated this. The wife and pt both stated they think the wrist is broken. Given that I will be doing therapy on this arm I was wondering if you would be willing to contact the pt and order an Xray just to make sure before I go further with his rehab.    Also  I think the patient could benefit from Speech Therapy (you referred him for PT and OT only) to address cognition and language. If you agree could you please submit an order for that and our front desk will arrange that.  Thank you!

## 2014-07-20 NOTE — Telephone Encounter (Signed)
I have spoken with [name] this am.  She sts. Gregory Wiley has left wrist and left thumb pain s/p fall.  Per RAS, left wrist, left hand x-rays ordered.  [name] is aware these will be done at [facility] and they do not need an appt./fim

## 2014-07-20 NOTE — Progress Notes (Signed)
Loop recorder 

## 2014-07-22 ENCOUNTER — Ambulatory Visit
Admission: RE | Admit: 2014-07-22 | Discharge: 2014-07-22 | Disposition: A | Discharge status: Home / Self Care | Payer: 59 | Source: Ambulatory Visit | Attending: Neurology | Admitting: Neurology

## 2014-07-22 ENCOUNTER — Encounter: Payer: Self-pay | Admitting: Internal Medicine

## 2014-07-22 DIAGNOSIS — M25532 Pain in left wrist: Principal | ICD-10-CM

## 2014-07-22 DIAGNOSIS — M25432 Effusion, left wrist: Secondary | ICD-10-CM

## 2014-07-22 DIAGNOSIS — M25542 Pain in joints of left hand: Secondary | ICD-10-CM

## 2014-07-22 LAB — LIPID PANEL
Cholesterol: 113 mg/dL (ref 0–200)
HDL: 38 mg/dL — ABNORMAL LOW (ref >=40)
LDL Cholesterol: 54 mg/dL (ref 0–99)
Total CHOL/HDL Ratio: 3 Ratio
Triglycerides: 104 mg/dL (ref <150)
VLDL: 21 mg/dL (ref 0–40)

## 2014-07-22 LAB — COMPREHENSIVE METABOLIC PANEL
ALT: 11 U/L (ref 0–53)
AST: 13 U/L (ref 0–37)
Albumin: 3.7 g/dL (ref 3.5–5.2)
Alkaline Phosphatase: 55 U/L (ref 39–117)
BUN: 10 mg/dL (ref 6–23)
CO2: 28 mEq/L (ref 19–32)
Calcium: 8.9 mg/dL (ref 8.4–10.5)
Chloride: 99 mEq/L (ref 96–112)
Creat: 0.84 mg/dL (ref 0.50–1.35)
Glucose, Bld: 90 mg/dL (ref 70–99)
Potassium: 3.9 mEq/L (ref 3.5–5.3)
Sodium: 138 mEq/L (ref 135–145)
Total Bilirubin: 0.6 mg/dL (ref 0.2–1.2)
Total Protein: 7.1 g/dL (ref 6.0–8.3)

## 2014-07-22 LAB — CBC
HCT: 42.5 % (ref 39.0–52.0)
Hemoglobin: 13.8 g/dL (ref 13.0–17.0)
MCH: 27.1 pg (ref 26.0–34.0)
MCHC: 32.5 g/dL (ref 30.0–36.0)
MCV: 83.3 fL (ref 78.0–100.0)
MPV: 11.7 fL (ref 8.6–12.4)
Platelets: 204 10*3/uL (ref 150–400)
RBC: 5.1 MIL/uL (ref 4.22–5.81)
RDW: 16.6 % — ABNORMAL HIGH (ref 11.5–15.5)
WBC: 7.6 10*3/uL (ref 4.0–10.5)

## 2014-07-23 LAB — TSH: TSH: 0.828 u[IU]/mL (ref 0.350–4.500)

## 2014-07-25 ENCOUNTER — Telehealth: Payer: Self-pay | Admitting: *Deleted

## 2014-07-25 ENCOUNTER — Ambulatory Visit: Payer: 59

## 2014-07-25 ENCOUNTER — Encounter: Payer: Self-pay | Admitting: Occupational Therapy

## 2014-07-25 ENCOUNTER — Ambulatory Visit: Payer: 59 | Admitting: Occupational Therapy

## 2014-07-25 VITALS — BP 112/82 | HR 81

## 2014-07-25 DIAGNOSIS — R531 Weakness: Secondary | ICD-10-CM

## 2014-07-25 DIAGNOSIS — Z7409 Other reduced mobility: Secondary | ICD-10-CM

## 2014-07-25 DIAGNOSIS — G8110 Spastic hemiplegia affecting unspecified side: Secondary | ICD-10-CM

## 2014-07-25 DIAGNOSIS — M25512 Pain in left shoulder: Secondary | ICD-10-CM

## 2014-07-25 NOTE — Therapy (Signed)
[facility] [address] [place], [place], [address] Phone: [phone]   Fax:  [phone]  Occupational Therapy Treatment  Patient Details  Name: Gregory Wiley MRN: [id number] Date of Birth: January 05, 1951 Referring Provider:  Tamsen Roers, MD  Encounter Date: 07/25/2014      OT End of Session - 07/25/14 1707    Visit Number 2   Number of Visits 16   Date for OT Re-Evaluation 09/13/14   Authorization Type [employer]   OT Start Time 1615   OT Stop Time 1659   OT Time Calculation (min) 44 min   Activity Tolerance Patient tolerated treatment well      Past Medical History  Diagnosis Date  . Coronary artery disease     stent, then CABG 07/20/10  . MI (myocardial infarction)     NSTEMI- last cath 06/2011-Stent to LCX-DES, last nuc 07/30/11 low risk  . Diabetes mellitus     hx of, diet controlled  . Hypertension   . Hyperlipidemia   . Obesity (BMI 30-39.9)   . OSA on CPAP     since July 2013  . H/O cardiomyopathy     ischemic, now last echo 07/30/11, EF 38%W  . Diverticulosis   . Internal hemorrhoids   . Tubular adenoma of colon   . Plantar fasciitis of left foot   . Stroke     Past Surgical History  Procedure Laterality Date  . Coronary artery bypass graft  07/20/2010     LIMA-LAD; VG-ACUTE MARG of RCA; Seq VG-distal RCA & then pda  . Coronary angioplasty with stent placement  07/01/2011    DES-Resolute to native LCX  . Coronary angioplasty with stent placement  12/01/2007    COMPLEX 5 LESION PCI INCLUDING CUTTING BALLOON AND 4 CYPHER DESs  . Hernia repair  2006  . Knee surgery Right   . Hand surgery      to take glass out  . Colonoscopy N/A 02/10/2013    Procedure: COLONOSCOPY;  Surgeon: Ladene Artist, MD;  Location: [facility] ENDOSCOPY;  Service: Endoscopy;  Laterality: N/A;  . Retinal detachment surgery    . Loop recorder implant  02/15/2014    MDT LINQ implanted by Dr Rayann Heman for cryptogenic stroke  .  Tee without cardioversion N/A 02/15/2014    Procedure: TRANSESOPHAGEAL ECHOCARDIOGRAM (TEE);  Surgeon: Josue Hector, MD;  Location: [facility] ENDOSCOPY;  Service: Cardiovascular;  Laterality: N/A;  . Left heart catheterization with coronary/graft angiogram  07/01/2011    Procedure: LEFT HEART CATHETERIZATION WITH Beatrix Fetters;  Surgeon: Sanda Klein, MD;  Location: [facility] CATH LAB;  Service: Cardiovascular;;  . Percutaneous coronary stent intervention (pci-s) Right 07/01/2011    Procedure: PERCUTANEOUS CORONARY STENT INTERVENTION (PCI-S);  Surgeon: Sanda Klein, MD;  Location: [facility] CATH LAB;  Service: Cardiovascular;  Laterality: Right;    There were no vitals filed for this visit.  Visit Diagnosis:  Generalized weakness  Impaired functional mobility and activity tolerance  Spastic hemiplegia, dominant side  Pain in joint, shoulder region, left      Subjective Assessment - 07/25/14 1619    Currently in Pain? Yes   Pain Score 3    Pain Location Back   Pain Orientation Lower   Pain Descriptors / Indicators Aching   Pain Type Chronic pain   Pain Onset 1 to 4 weeks ago   Pain Frequency Occasional   Aggravating Factors  sleeping in a bed instead of lift chair   Pain Relieving  Factors nothing   Multiple Pain Sites No                      OT Treatments/Exercises (OP) - 07/25/14 0001    Neurological Re-education Exercises   Other Exercises 1 Neuro re ed to address sit to stand, stand to sit, toilet transfers,with emphasis on using LE's and decreasing use of RUE for these activities. Pt is able to complete sit to stand with close supervision and mod vc's with proper alignment and active forward lean. Neuro re ed to also address alignement for L shoulder to reduce pain/increase PROM in supine followed by active stretch in sitting by patient as beginning of HEP - see pt instruction section for details. Pt will also use this strategy to decrease tone as needed.                  OT Education - 07/25/14 1704    Education provided Yes   Education Details beginning HEP for LUE   Person(s) Educated Patient;Spouse   Methods Explanation;Demonstration;Tactile cues;Verbal cues;Handout   Comprehension Verbalized understanding;Returned demonstration          OT Short Term Goals - 07/25/14 1705    OT SHORT TERM GOAL #1   Title Pt and wife will be mod I with HEP - 08/16/2014   Status On-going   OT SHORT TERM GOAL #2   Title Pt will report no more then 3/10 pain with PROM of shoulder flexion to at least 90* to assist with self care.   Status On-going   OT SHORT TERM GOAL #3   Title Pt will require no more than moderate assist for LB dressing   Status On-going   OT SHORT TERM GOAL #4   Title Pt will require supervision for UB dressing, mod vc's   Status On-going   OT SHORT TERM GOAL #5   Title Pt will require no more than min a for toilet transfers   Status On-going   OT SHORT TERM GOAL #6   Title Pt will require min a to hike pants with toileting   Status On-going           OT Long Term Goals - 07/25/14 1705    OT LONG TERM GOAL #1   Title Pt and wife will be mod I with upgraded HEP PRN - 09/13/2014   Status On-going   OT LONG TERM GOAL #2   Title Pt will be min a with LB dressing   Status On-going   OT LONG TERM GOAL #3   Title Pt will be supervision UB bathing   Status On-going   OT LONG TERM GOAL #4   Title Pt will be mod a LB bathing   Status On-going   OT LONG TERM GOAL #5   Title Pt will be min a for transfer tub bench into shower   Status On-going   OT LONG TERM GOAL #6   Title Pt will require min a for simple hot meal prep at wheelchair level , standing at counter intermittently with close S   Status On-going   OT LONG TERM GOAL #7   Title Pt will recall 2 strategies for adapted bathing/dressing   Status On-going   OT LONG TERM GOAL #8   Title Pt will tolerate 20 minutes of functional activity without rest breaks    Status On-going               Plan - 07/25/14 1706  Clinical Impression Statement Reviewed goals with pt (wife left for part of session) and pt in agreement. Initiated HEP for LUE. Wife to bring in splint for LUE.  Pt and wife state no one has given them a HEP for the arm.   Pt will benefit from skilled therapeutic intervention in order to improve on the following deficits (Retired) Decreased activity tolerance;Decreased balance;Decreased endurance;Decreased cognition;Decreased knowledge of use of DME;Decreased mobility;Decreased range of motion;Decreased safety awareness;Decreased strength;Impaired UE functional use;Impaired tone;Pain   Rehab Potential Good   OT Frequency 2x / week   OT Duration 8 weeks   OT Treatment/Interventions Self-care/ADL training;Moist Heat;Electrical Stimulation;DME and/or AE instruction;Neuromuscular education;Therapeutic exercise;Functional Mobility Training;Manual Therapy;Passive range of motion;Splinting;Therapeutic activities;Balance training;Patient/family education;Cognitive remediation/compensation   Plan add to HEP, beginning UB ADL training.   Consulted and Agree with Plan of Care Patient;Family member/caregiver   Family Member Consulted wife        Problem List Patient Active Problem List   Diagnosis Date Noted  . Antiplatelet or antithrombotic long-term use 04/22/2014  . Chest pain 04/17/2014  . SOB (shortness of breath) 04/17/2014  . Bilateral pulmonary embolism 04/11/2014  . Left-sided neglect 03/09/2014  . Dysphagia, pharyngoesophageal phase 03/02/2014  . Arthralgia of left ankle 02/24/2014  . Hemiplegia affecting left dominant side 02/16/2014  . 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 Benign neoplasm of colon 02/10/2013  . Special screening for malignant neoplasms, colon 02/10/2013  . OSA on CPAP 01/06/2013  . S/P angioplasty with DES to CFX 07/01/11 07/02/2011  . Sleep apnea, "cant afford C-pap"  07/02/2011  . CAD, RCA PCI '09, 10/11, CABG X 4 5/12 06/30/2011  . Hypertension, B/P has been low this admission 06/30/2011  . Hyperlipidemia 06/30/2011  . Morbid obesity 06/30/2011  . NSTEMI, 06/29/11 06/30/2011  . 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 Ischemic cardiomyopathy, EF 35-40 2D May 2012 06/30/2011  . CHF, acute, mild 06/30/2011    Quay Burow, OTR/L 07/25/2014, 5:08 PM  [facility] [address] [place], [place], [address] Phone: [phone]   Fax:  [phone]

## 2014-07-25 NOTE — Telephone Encounter (Signed)
I have spoken with Gregory Wiley and per RAS, advised that x-rays showed arthritis, but no fx's.  He verbalized understanding of same/fim

## 2014-07-25 NOTE — Telephone Encounter (Signed)
-----   Message from Britt Bottom, MD sent at 07/22/2014  7:56 PM EDT ----- Please let them know the xrays show a lot of arthritis in hand but no fractures in hand/wrist

## 2014-07-25 NOTE — Therapy (Signed)
[facility] [address] [place], [place], [address] Phone: [phone]   Fax:  [phone]  Physical Therapy Treatment  Patient Details  Name: Gregory Wiley MRN: [id number] Date of Birth: 1950/10/30 Referring Provider:  Tamsen Roers, MD  Encounter Date: 07/25/2014      PT End of Session - 07/25/14 1625    Visit Number 2   Number of Visits 17   Date for PT Re-Evaluation 09/17/14   Authorization Type U[employer], but pt reported he should get Medicare next week. PT assigned G-codes in case he qualifies for Medicare.   PT Start Time 1530   PT Stop Time 1615   PT Time Calculation (min) 45 min      Past Medical History  Diagnosis Date  . Coronary artery disease     stent, then CABG 07/20/10  . MI (myocardial infarction)     NSTEMI- last cath 06/2011-Stent to LCX-DES, last nuc 07/30/11 low risk  . Diabetes mellitus     hx of, diet controlled  . Hypertension   . Hyperlipidemia   . Obesity (BMI 30-39.9)   . OSA on CPAP     since July 2013  . H/O cardiomyopathy     ischemic, now last echo 07/30/11, EF 38%W  . Diverticulosis   . Internal hemorrhoids   . Tubular adenoma of colon   . Plantar fasciitis of left foot   . Stroke     Past Surgical History  Procedure Laterality Date  . Coronary artery bypass graft  07/20/2010     LIMA-LAD; VG-ACUTE MARG of RCA; Seq VG-distal RCA & then pda  . Coronary angioplasty with stent placement  07/01/2011    DES-Resolute to native LCX  . Coronary angioplasty with stent placement  12/01/2007    COMPLEX 5 LESION PCI INCLUDING CUTTING BALLOON AND 4 CYPHER DESs  . Hernia repair  2006  . Knee surgery Right   . Hand surgery      to take glass out  . Colonoscopy N/A 02/10/2013    Procedure: COLONOSCOPY;  Surgeon: Ladene Artist, MD;  Location: [facility] ENDOSCOPY;  Service: Endoscopy;  Laterality: N/A;  . Retinal detachment surgery    . Loop recorder implant  02/15/2014    MDT LINQ implanted by Dr  Rayann Heman for cryptogenic stroke  . Tee without cardioversion N/A 02/15/2014    Procedure: TRANSESOPHAGEAL ECHOCARDIOGRAM (TEE);  Surgeon: Josue Hector, MD;  Location: [facility] ENDOSCOPY;  Service: Cardiovascular;  Laterality: N/A;  . Left heart catheterization with coronary/graft angiogram  07/01/2011    Procedure: LEFT HEART CATHETERIZATION WITH Beatrix Fetters;  Surgeon: Sanda Klein, MD;  Location: [facility] CATH LAB;  Service: Cardiovascular;;  . Percutaneous coronary stent intervention (pci-s) Right 07/01/2011    Procedure: PERCUTANEOUS CORONARY STENT INTERVENTION (PCI-S);  Surgeon: Sanda Klein, MD;  Location[facility] LAB;  Service: Cardiovascular;  Laterality: Right;    Filed Vitals:   07/25/14 1600  BP: 112/82  Pulse: 81    Visit Diagnosis:  Generalized weakness  Impaired functional mobility and activity tolerance      Subjective Assessment - 07/25/14 1600    Subjective pt reports he feels "drugged" and later in session while standing reports that his head is spinning.    Currently in Pain? Yes   Pain Score 3    Pain Location Back   Pain Orientation Lower   Pain Descriptors / Indicators Aching   Pain Type Chronic pain   Pain Onset 1 to 4 weeks  ago   Pain Frequency Occasional   Aggravating Factors  when he sleeps in the bed instead of the lift chair (reports the lift chair isn't comfortable either)   Pain Relieving Factors nothing            [facility] PT Assessment - 07/25/14 0001    Standardized Balance Assessment   Standardized Balance Assessment Berg Balance Test   Berg Balance Test   Sit to Stand Needs moderate or maximal assist to stand   Standing Unsupported Able to stand 2 minutes with supervision  assist to stand, then able to steady with chair, then let go   Sitting with Back Unsupported but Feet Supported on Floor or Stool Able to sit safely and securely 2 minutes   Stand to Sit Controls descent by using hands   Transfers Able to transfer with verbal cueing  and /or supervision   Standing Unsupported with Eyes Closed Able to stand 10 seconds with supervision   Standing Ubsupported with Feet Together Able to place feet together independently but unable to hold for 30 seconds   From Standing, Reach Forward with Outstretched Arm Can reach forward >12 cm safely (5")   From Standing Position, Pick up Object from Floor Unable to pick up shoe, but reaches 2-5 cm (1-2") from shoe and balances independently   From Standing Position, Turn to Look Behind Over each Shoulder Needs assist to keep from losing balance and falling   Turn 360 Degrees Needs assistance while turning   Standing Unsupported, Alternately Place Feet on Step/Stool Needs assistance to keep from falling or unable to try   Standing Unsupported, One Foot in [employer] balance while stepping or standing   Standing on One Leg Unable to try or needs assist to prevent fall   Total Score 22         Self care-explained results of X ray to pt and wife, wife and pt reported concern that they haven't received an AFO and requested an order--therapist sent this request to Dr. Felecia Shelling, discussed with pt and wife the importance of pt performing as much of his sit to stands independently as possible rather than requiring her to pull him up--for maximal return.   Neuro re-ed: Merrilee Jansky balance test Sit to stands ~10x with instructions for sequencing, progressed from MOD/MAX A to supervision with intermittent UE support on chair in front of him upon rising.                    PT Education - 07/25/14 1624    Education provided Yes   Education Details HEP and therapist reviewed negative X-ray results with PT   Person(s) Educated Patient;Spouse   Methods Explanation;Handout;Verbal cues;Demonstration   Comprehension Verbalized understanding;Returned demonstration          PT Short Term Goals - 07/19/14 1437    PT SHORT TERM GOAL #1   Title Pt will be independent in HEP to improve strength,  balance, endurance, and safety. Target date: 08/16/14.   Status New   PT SHORT TERM GOAL #2   Title Perform BERG and write goals if appropriate. Target date: 08/16/14.   Status New   PT SHORT TERM GOAL #3   Title Pt will perform sit<>stand with UE assist at MOD I level to improve functional mobilty. Target date: 08/16/14.   Status New   PT SHORT TERM GOAL #4   Title Pt will ambulate 200' over even terrain with LRAD and supervision to improve functional mobility. Target  date: 08/16/14.   Status New   PT SHORT TERM GOAL #5   Title Pt will perform TUG in </=90 seconds to reduce falls risk. Target date: 08/16/14.   Status New           PT Long Term Goals - 07/19/14 1440    PT LONG TERM GOAL #1   Title Pt will verbalize understanding of CVA signs/symptoms and risk factors to reduce risk of another CVA. Target date: 09/13/14.   Status New   PT LONG TERM GOAL #2   Title Pt will report no falls over the last four weeks to improve safety during functional mobility. Target date; 09/13/14.   Status New   PT LONG TERM GOAL #3   Title Pt will ambulate 400' over even/uneven terrain with LRAD at MOD I level to improve functional mobililty. Target date: 09/13/14.   Status New   PT LONG TERM GOAL #4   Title Pt's SIS-mobility score will improve by 10% to improve quality of life. Target date: 09/13/14.   Status New   PT LONG TERM GOAL #5   Title Pt's gait speed will improve to >/=1.39ft/sec. with LRAD to reduce falls risk. Target date: 09/13/14.   Status New               Plan - 07/25/14 1625    Clinical Impression Statement Pt initially very frustrated reporting he is dizzy and feels "drugged" due to medications. He reports his MD is aware of this. Wife reports his meds are low dose. Extra time required to complete the BERG balance test due to frequent rest breaks due to back pain. Initiated HEP but will need additional exercises next visit. Able to sit to stand with close supervision to CGA with  verbal cues for sequencing and form.  Continue per POC.   PT Next Visit Plan See if AFO order came through. (In basket sent to Dr. Felecia Shelling today). Review HEP. Add standing and turning to look over shoulder, core and BLE strength, and determine if pt needs bed mobility related HEP.    Consulted and Agree with Plan of Care Patient;Family member/caregiver        Problem List Patient Active Problem List   Diagnosis Date Noted  . Antiplatelet or antithrombotic long-term use 04/22/2014  . Chest pain 04/17/2014  . SOB (shortness of breath) 04/17/2014  . Bilateral pulmonary embolism 04/11/2014  . Left-sided neglect 03/09/2014  . Dysphagia, pharyngoesophageal phase 03/02/2014  . Arthralgia of left ankle 02/24/2014  . Hemiplegia affecting left dominant side 02/16/2014  . 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 Benign neoplasm of colon 02/10/2013  . Special screening for malignant neoplasms, colon 02/10/2013  . OSA on CPAP 01/06/2013  . S/P angioplasty with DES to CFX 07/01/11 07/02/2011  . Sleep apnea, "cant afford C-pap" 07/02/2011  . CAD, RCA PCI '09, 10/11, CABG X 4 5/12 06/30/2011  . Hypertension, B/P has been low this admission 06/30/2011  . Hyperlipidemia 06/30/2011  . Morbid obesity 06/30/2011  . NSTEMI, 06/29/11 06/30/2011  . 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 Ischemic cardiomyopathy, EF 35-40 2D May 2012 06/30/2011  . CHF, acute, mild 06/30/2011    Delrae Sawyers, PT,DPT,NCS 07/25/2014 4:32 PM Phone [phone] FAX [phone]          [facility] [address] [place], [place], [address] Phone: [phone]   Fax:  [phone]

## 2014-07-25 NOTE — Patient Instructions (Signed)
PRE GAIT: Standing Weight Shift   Stand with upright posture, hold onto a chair in front of you for support. Shift weight from side to side with extra time spent on the left side. Keep knees straight. Perform 25 shifts daily.  Copyright  VHI. All rights reserved.    Knee Extension: Sit to Stand (Eccentric)   Position feet so they are flat on the floor and even, then scoot to the edge of the chair. Lean forward as far as you can, and push up from the armrest to stand, trying to keep the weight even between your feet. Then sit down slowly, keeping weight even between your legs. Perform 10x daily. Morning and night.

## 2014-07-25 NOTE — Patient Instructions (Signed)
Home Exercise program for left  Arm:  Do this 2 times per day - once in the morning and once at night. STOP if you get shoulder pain and let your therapist know.   1. Sit on a firm surface.  Hold Left arm with your right hand. Slowly lean forward and reach toward the floor. HOLD FOR  A SLOW COUNT OF 5. Sit back up. Do 10, rest then do 10 more.

## 2014-07-26 ENCOUNTER — Other Ambulatory Visit: Payer: Self-pay | Admitting: Neurology

## 2014-07-26 ENCOUNTER — Ambulatory Visit (INDEPENDENT_AMBULATORY_CARE_PROVIDER_SITE_OTHER): Payer: 59 | Admitting: Gastroenterology

## 2014-07-26 ENCOUNTER — Telehealth: Payer: Self-pay | Admitting: Neurology

## 2014-07-26 ENCOUNTER — Encounter: Payer: Self-pay | Admitting: Gastroenterology

## 2014-07-26 ENCOUNTER — Other Ambulatory Visit (HOSPITAL_COMMUNITY): Payer: Self-pay | Admitting: Neurology

## 2014-07-26 VITALS — BP 102/70 | HR 84 | Ht 72.0 in | Wt 266.2 lb

## 2014-07-26 DIAGNOSIS — K921 Melena: Secondary | ICD-10-CM | POA: Diagnosis not present

## 2014-07-26 DIAGNOSIS — G8192 Hemiplegia, unspecified affecting left dominant side: Secondary | ICD-10-CM

## 2014-07-26 DIAGNOSIS — Z7901 Long term (current) use of anticoagulants: Secondary | ICD-10-CM

## 2014-07-26 DIAGNOSIS — K253 Acute gastric ulcer without hemorrhage or perforation: Secondary | ICD-10-CM

## 2014-07-26 DIAGNOSIS — R1314 Dysphagia, pharyngoesophageal phase: Secondary | ICD-10-CM

## 2014-07-26 DIAGNOSIS — R1310 Dysphagia, unspecified: Secondary | ICD-10-CM | POA: Insufficient documentation

## 2014-07-26 DIAGNOSIS — I63411 Cerebral infarction due to embolism of right middle cerebral artery: Secondary | ICD-10-CM

## 2014-07-26 MED ORDER — HYDROCORTISONE ACETATE 25 MG RE SUPP: 25.0000 mg | Freq: Every day | RECTAL | Status: DC

## 2014-07-26 NOTE — Progress Notes (Signed)
    History of Present Illness: This is a 64 year old male with small volume hematochezia and constipation. He is accompanied by his wife. He is mainainted on Xarelto. He had a large R MCA CVA in 01/2014, bilateral PEs in 03/2014, and has a history of CAD and an ischemic cardiomyopathy. CBC on 5/6 was normal with Hb=13.8. He notes ongoing problems with constipation and hard stools and has had some success with Senokot and MiraLAX although his stools intermittently remain hard. He notes small volumes of bright red blood with bowel movements frequently for several months. While in the office this morning he became nauseated and vomited twice. He states he feels better now. He states he frequently gets nauseated after taking his morning medications particularly when he doesn't have a full breakfast which was the case today. He states he is unable to land his left side as it made injure his left shoulder.  Colonoscopy in 01/2013 showed a adenomatous polyp, diverticulosis and small internal hemorrhoids.  EGD in 01/2014 showed on antral ulcer, erosive gastritis, erosive duodenitis, esophageal stricture, HH. He did not return for recommended follow up EGD.    Current Medications, Allergies, Past Medical History, Past Surgical History, Family History and Social History were reviewed in [employer] record.  Physical Exam: General: Well developed , well nourished, obese, chronically ill-appearing, wheelchair, no acute distress Head: Normocephalic and atraumatic Eyes:  sclerae anicteric, EOMI Ears: Normal auditory acuity Mouth: No deformity or lesions Lungs: Clear throughout to auscultation Heart: Regular rate and rhythm; no murmurs, rubs or bruits Abdomen: Soft, non tender and non distended. No masses, hepatosplenomegaly or hernias noted. Normal Bowel sounds Rectal: Not performed. Patient declined due to nausea and vomiting and inability to position on his left side Musculoskeletal:  Symmetrical with no gross deformities  Pulses:  Normal pulses noted Extremities: No clubbing, cyanosis, edema or deformities noted Neurological: Alert oriented x 4, grossly nonfocal Psychological:  Alert and cooperative. Normal mood and affect  Assessment and Recommendations:  1. Small volume hematochezia and constipation. Bleeding is secondary to known internal hemorrhoids on colonoscopy in 01/2013.  Bleeding exacerbated by constipation and chronic anticoagulation. Anusol HC supp daily for 1 week then daily as needed. Rectal care instructions. Begin Colace once daily. Increase MiraLAX to once or twice daily for adequate bowel movements. He is advised to call if his symptoms do not substantially improved.  2. 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 Continue PPI bid long term and antireflux measures. Avoid ASA and NSAIDs long-term especially while taking Xarelto. Given his multiple comorbidities he is not a good candidate for elective endoscopic procedures so will defer follow-up EGD for his gastric ulcer.   3. Nausea and vomiting. Likely medication related. Advised to space out morning doses of medications under the guidance of the prescribing physicians.

## 2014-07-26 NOTE — Patient Instructions (Signed)
We have sent the following medications to your pharmacy for you to pick up at your convenience: Anusol [facility] suppositories-Insert 1 suppository into rectum daily x 1 week then as needed  Please purchase the following medications over the counter and take as directed: Colace once daily Miralax-Take 1-2 capfuls (17-34 grams) daily  Please follow rectal care instructions given to you today.

## 2014-07-26 NOTE — Telephone Encounter (Signed)
LMVM to inform patient of Swallow test scheduled for 08/04/14 he needs to arrive at [facility] floor radiology at 12:45. If he can not keep this appointment he can call them [phone]

## 2014-07-27 ENCOUNTER — Ambulatory Visit: Payer: 59 | Admitting: Occupational Therapy

## 2014-07-27 ENCOUNTER — Ambulatory Visit: Payer: 59

## 2014-07-27 ENCOUNTER — Telehealth: Payer: Self-pay | Admitting: Gastroenterology

## 2014-07-27 DIAGNOSIS — Z7409 Other reduced mobility: Secondary | ICD-10-CM

## 2014-07-27 DIAGNOSIS — M25512 Pain in left shoulder: Secondary | ICD-10-CM

## 2014-07-27 DIAGNOSIS — R531 Weakness: Secondary | ICD-10-CM

## 2014-07-27 DIAGNOSIS — R29898 Other symptoms and signs involving the musculoskeletal system: Secondary | ICD-10-CM

## 2014-07-27 NOTE — Patient Instructions (Addendum)
Your Splint This splint should initially be fitted by a healthcare practitioner.  The healthcare practitioner is responsible for providing wearing instructions and precautions to the patient, other healthcare practitioners and care provider involved in the patient's care.  This splint was custom made for you. Please read the following instructions to learn about wearing and caring for your splint.  Precautions Should your splint cause any of the following problems, remove the splint immediately and contact your therapist/physician.  Swelling  Severe Pain  Pressure Areas  Stiffness  Numbness  Do not wear your splint while operating machinery unless it has been fabricated for that purpose.  When To Wear Your Splint Where your splint according to your therapist/physician instructions. Daytime for 1-2 hours, monitor skin closely for pressure areas/ redness, if noted remove splint If no problems gradually increase wear time while awake to 4-6 hrs If no problems wearing for 4-6 hrs progress to night time wear only  Care and Cleaning of Your Splint 1. Keep your splint away from open flames, heat sources and do not leave in your car, it will melt! 2. Your splint will lose its shape in temperatures over 135 degrees Farenheit, ( in car windows, near radiators, ovens or in hot water).  Never make any adjustments to your splint, if the splint needs adjusting remove it and make an appointment to see your therapist. 3. Your splint, including the cushion liner may be cleaned with soap and lukewarm water.  Do not immerse in hot water over 135 degrees Farenheit. 4. Straps may be washed with soap and water, but do not moisten the self-adhesive portion. 5. Make sure splint is completely dry before putting it back on  (will issue next visit with splint)

## 2014-07-27 NOTE — Therapy (Signed)
[facility] [address] [place], [place], [address] Phone: [phone]   Fax:  [phone]  Physical Therapy Treatment  Patient Details  Name: Gregory Wiley MRN: [id number] Date of Birth: [birth year] Referring Provider:  Tamsen Roers, MD  Encounter Date: 07/27/2014      PT End of Session - 07/27/14 2051    Visit Number 3   Number of Visits 17   Date for PT Re-Evaluation 09/17/14   Authorization Type [employer], but pt reported he should get Medicare next week. PT assigned G-codes in case he qualifies for Medicare.   PT Start Time 1448   PT Stop Time 1529   PT Time Calculation (min) 41 min   Equipment Utilized During Treatment Gait belt   Activity Tolerance Other (comment)  pt limited by dizziness/wooziness.   Behavior During Therapy [facility] for tasks assessed/performed      Past Medical History  Diagnosis Date  . Coronary artery disease     stent, then CABG 07/20/10  . MI (myocardial infarction)     NSTEMI- last cath 06/2011-Stent to LCX-DES, last nuc 07/30/11 low risk  . Diabetes mellitus     hx of, diet controlled  . Hypertension   . Hyperlipidemia   . Obesity (BMI 30-39.9)   . OSA on CPAP     since July 2013  . H/O cardiomyopathy     ischemic, now last echo 07/30/11, EF 38%W  . Diverticulosis   . Internal hemorrhoids   . Tubular adenoma of colon   . Plantar fasciitis of left foot   . Stroke     Past Surgical History  Procedure Laterality Date  . Coronary artery bypass graft  07/20/2010     LIMA-LAD; VG-ACUTE MARG of RCA; Seq VG-distal RCA & then pda  . Coronary angioplasty with stent placement  07/01/2011    DES-Resolute to native LCX  . Coronary angioplasty with stent placement  12/01/2007    COMPLEX 5 LESION PCI INCLUDING CUTTING BALLOON AND 4 CYPHER DESs  . Hernia repair  2006  . Knee surgery Right   . Hand surgery Right     to take glass out  . Colonoscopy N/A 02/10/2013    Procedure: COLONOSCOPY;   Surgeon: Ladene Artist, MD;  Location: [facility];  Service: Endoscopy;  Laterality: N/A;  . Retinal detachment surgery Right   . Loop recorder implant  02/15/2014    MDT LINQ implanted by Dr Rayann Heman for cryptogenic stroke  . Tee without cardioversion N/A 02/15/2014    Procedure: TRANSESOPHAGEAL ECHOCARDIOGRAM (TEE);  Surgeon: Josue Hector, MD;  Location: [facility] ENDOSCOPY;  Service: Cardiovascular;  Laterality: N/A;  . Left heart catheterization with coronary/graft angiogram  07/01/2011    Procedure: LEFT HEART CATHETERIZATION WITH Beatrix Fetters;  Surgeon: Sanda Klein, MD;  Location: [facility] CATH LAB;  Service: Cardiovascular;;  . Percutaneous coronary stent intervention (pci-s) Right 07/01/2011    Procedure: PERCUTANEOUS CORONARY STENT INTERVENTION (PCI-S);  Surgeon: Sanda Klein, MD;  Location: [facility] CATH LAB;  Service: Cardiovascular;  Laterality: Right;    There were no vitals filed for this visit.  Visit Diagnosis:  Impaired functional mobility and activity tolerance  Left leg weakness      Subjective Assessment - 07/27/14 1453    Subjective Pt denied falls since last session. Pt later reported during treatment he has a headache that comes and goes since he was in SNF, he described the pain as originating from his neck and moving proximal to his  head.   Patient is accompained by: Family member  [name]   Pertinent History MI, CABG, DM, CA   Patient Stated Goals walk better and longer distances   Currently in Pain? Yes   Pain Score 2    Pain Location Buttocks   Pain Orientation Right   Pain Descriptors / Indicators Aching   Pain Type Chronic pain   Pain Onset More than a month ago   Pain Frequency Intermittent   Aggravating Factors  sitting on cell phone   Pain Relieving Factors repositioning         Neuro re-ed: -Sit<>stand x15, with cues to improve anterior weight shifting. Pt required assist to place L foot to perform sit to stand, PT educated pt on using R LE to  place L LE and pt progressed to no assist. Performed with min guard to supervision to ensure safety. Pt required one rest break during sit<>stands. -In standing: Lateral weight shifting side to side x30 with VC's and tactile cues to improve L LE weight shifting and to reduce L genu recurvatum. -PT assessed smooth pursuits, saccades and VOR. Please see clinical impression for details.  Therex: -Seated: L LAQ 2x10 (AAROM). Cues to move through full ROM, L hip march x5 (AAROM) with cues to decrease UE support/trunk rotation for compensation of weak hip flexors. Pt required increased time due to weakness and fatigue.                        PT Education - 07/27/14 2049    Education provided Yes   Education Details Reviewed HEP and re-printed HEP with larger font per pt request. PT reiterated the importance of performing HEP. PT educated pt on exercises to decrease dizziness and notified pt that he will receive exercises next visit, due to session was ended.   Person(s) Educated Patient;Spouse   Methods Explanation;Demonstration;Tactile cues;Verbal cues;Handout   Comprehension Verbalized understanding;Returned demonstration          PT Short Term Goals - 07/27/14 2058    PT SHORT TERM GOAL #1   Title Pt will be independent in HEP to improve strength, balance, endurance, and safety. Target date: 08/16/14.   Status On-going   PT SHORT TERM GOAL #2   Title Perform BERG and write goals if appropriate. Target date: 08/16/14.   Status On-going   PT SHORT TERM GOAL #3   Title Pt will perform sit<>stand with UE assist at MOD I level to improve functional mobilty. Target date: 08/16/14.   Status On-going   PT SHORT TERM GOAL #4   Title Pt will ambulate 200' over even terrain with LRAD and supervision to improve functional mobility. Target date: 08/16/14.   Status On-going   PT SHORT TERM GOAL #5   Title Pt will perform TUG in </=90 seconds to reduce falls risk. Target date: 08/16/14.    Status On-going           PT Long Term Goals - 07/27/14 2058    PT LONG TERM GOAL #1   Title Pt will verbalize understanding of CVA signs/symptoms and risk factors to reduce risk of another CVA. Target date: 09/13/14.   Status On-going   PT LONG TERM GOAL #2   Title Pt will report no falls over the last four weeks to improve safety during functional mobility. Target date; 09/13/14.   Status On-going   PT LONG TERM GOAL #3   Title Pt will ambulate 400' over even/uneven terrain with LRAD  at MOD I level to improve functional mobililty. Target date: 09/13/14.   Status On-going   PT LONG TERM GOAL #4   Title Pt's SIS-mobility score will improve by 10% to improve quality of life. Target date: 09/13/14.   Status On-going   PT LONG TERM GOAL #5   Title Pt's gait speed will improve to >/=1.65ft/sec. with LRAD to reduce falls risk. Target date: 09/13/14.   Status On-going               Plan - 07/27/14 2052    Clinical Impression Statement Pt limited by lightheadedness/dizziness today, and required frequent seated rest breaks due to lightheadedness and fatigue. Pt reported wooziness was a 1/10 during smooth pursuits and 8/10 during standing and looking over his shoulder. VOR and saccades negative for symptoms. Pt reported wooziness subsided with rest. Pt would continue to benefit from skliled PT to improve safety during functional mobility.   Pt will benefit from skilled therapeutic intervention in order to improve on the following deficits Difficulty walking;Decreased safety awareness;Decreased endurance;Obesity;Decreased knowledge of use of DME;Decreased balance;Decreased mobility;Decreased strength;Impaired UE functional use;Other (comment)   Rehab Potential Good   PT Frequency 2x / week   PT Duration 8 weeks   PT Treatment/Interventions ADLs/Self Care Home Management;Gait training;Neuromuscular re-education;Biofeedback;Functional mobility training;Patient/family  education;Cryotherapy;Therapeutic activities;Wheelchair mobility training;Electrical Stimulation;Therapeutic exercise;Manual techniques;DME Instruction;Balance training;Other (comment)   PT Next Visit Plan Trial supine L LE strengthening exercises, provide habituation exercises to decrease wooziness, assess car transfer and educate as needed.   Consulted and Agree with Plan of Care Patient;Family member/caregiver   Family Member Consulted wife-Laura        Problem List Patient Active Problem List   Diagnosis Date Noted  . Dysphagia 07/26/2014  . Antiplatelet or antithrombotic long-term use 04/22/2014  . Chest pain 04/17/2014  . SOB (shortness of breath) 04/17/2014  . Bilateral pulmonary embolism 04/11/2014  . Left-sided neglect 03/09/2014  . Dysphagia, pharyngoesophageal phase 03/02/2014  . Arthralgia of left ankle 02/24/2014  . Hemiplegia affecting left dominant side 02/16/2014  . 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 Benign neoplasm of colon 02/10/2013  . Special screening for malignant neoplasms, colon 02/10/2013  . OSA on CPAP 01/06/2013  . S/P angioplasty with DES to CFX 07/01/11 07/02/2011  . Sleep apnea, "cant afford C-pap" 07/02/2011  . CAD, RCA PCI '09, 10/11, CABG X 4 5/12 06/30/2011  . Hypertension, B/P has been low this admission 06/30/2011  . Hyperlipidemia 06/30/2011  . Morbid obesity 06/30/2011  . NSTEMI, 06/29/11 06/30/2011  . 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 Ischemic cardiomyopathy, EF 35-40 2D May 2012 06/30/2011  . CHF, acute, mild 06/30/2011    Tena Linebaugh L 07/27/2014, 9:01 PM  [facility] [address] [place], [place], [address] Phone: [phone]   Fax:  [phone]     Geoffry Paradise, PT,DPT 07/27/2014 9:01 PM Phone: [phone] Fax: [phone]

## 2014-07-27 NOTE — Patient Instructions (Signed)
PRE GAIT: Standing Weight Shift   Stand with upright posture, hold onto a chair in front of you for support. Shift weight from side to side with extra time spent on the left side. Keep knees straight. Perform 25 shifts daily.  Copyright  VHI. All rights reserved.   Knee Extension: Sit to Stand (Eccentric)   Position feet so they are flat on the floor and even, then scoot to the edge of the chair. Lean forward as far as you can, and push up from the armrest to stand, trying to keep the weight even between your feet. Then sit down slowly, keeping weight even between your legs. Perform 10x daily. Morning and night.          Copyright  VHI. All rights reserved.

## 2014-07-27 NOTE — Telephone Encounter (Signed)
Patient's wife notified that he should either use the senokot or the dulcoloax not both.  Titrate the miralax as needed for constipation

## 2014-07-28 NOTE — Therapy (Signed)
[facility] [address] [place], [place], [address] Phone: [phone]   Fax:  [phone]  Occupational Therapy Treatment  Patient Details  Name: Gregory Wiley MRN: [id number] Date of Birth: 07-03-1950 Referring Provider:  Tamsen Roers, MD  Encounter Date: 07/27/2014      OT End of Session - 07/28/14 1405    Visit Number 3   Number of Visits 16   Date for OT Re-Evaluation 09/13/14   Authorization Type [employer]   OT Start Time 1405   OT Stop Time 1445   OT Time Calculation (min) 40 min   Activity Tolerance Patient tolerated treatment well   Behavior During Therapy [facility] for tasks assessed/performed      Past Medical History  Diagnosis Date  . Coronary artery disease     stent, then CABG 07/20/10  . MI (myocardial infarction)     NSTEMI- last cath 06/2011-Stent to LCX-DES, last nuc 07/30/11 low risk  . Diabetes mellitus     hx of, diet controlled  . Hypertension   . Hyperlipidemia   . Obesity (BMI 30-39.9)   . OSA on CPAP     since July 2013  . H/O cardiomyopathy     ischemic, now last echo 07/30/11, EF 38%W  . Diverticulosis   . Internal hemorrhoids   . Tubular adenoma of colon   . Plantar fasciitis of left foot   . Stroke     Past Surgical History  Procedure Laterality Date  . Coronary artery bypass graft  07/20/2010     LIMA-LAD; VG-ACUTE MARG of RCA; Seq VG-distal RCA & then pda  . Coronary angioplasty with stent placement  07/01/2011    DES-Resolute to native LCX  . Coronary angioplasty with stent placement  12/01/2007    COMPLEX 5 LESION PCI INCLUDING CUTTING BALLOON AND 4 CYPHER DESs  . Hernia repair  2006  . Knee surgery Right   . Hand surgery Right     to take glass out  . Colonoscopy N/A 02/10/2013    Procedure: COLONOSCOPY;  Surgeon: Ladene Artist, MD;  Location: [facility] ENDOSCOPY;  Service: Endoscopy;  Laterality: N/A;  . Retinal detachment surgery Right   . Loop recorder implant   02/15/2014    MDT LINQ implanted by Dr Rayann Heman for cryptogenic stroke  . Tee without cardioversion N/A 02/15/2014    Procedure: TRANSESOPHAGEAL ECHOCARDIOGRAM (TEE);  Surgeon: Josue Hector, MD;  Location: [facility] ENDOSCOPY;  Service: Cardiovascular;  Laterality: N/A;  . Left heart catheterization with coronary/graft angiogram  07/01/2011    Procedure: LEFT HEART CATHETERIZATION WITH Beatrix Fetters;  Surgeon: Sanda Klein, MD;  Location: [facility] CATH LAB;  Service: Cardiovascular;;  . Percutaneous coronary stent intervention (pci-s) Right 07/01/2011    Procedure: PERCUTANEOUS CORONARY STENT INTERVENTION (PCI-S);  Surgeon: Sanda Klein, MD;  Location: [facility] CATH LAB;  Service: Cardiovascular;  Laterality: Right;    There were no vitals filed for this visit.  Visit Diagnosis:  Generalized weakness  Impaired functional mobility and activity tolerance  Pain in joint, shoulder region, left      Subjective Assessment - 07/27/14 1407    Pertinent History see epic snapshot, pt with low orthostatic BP, pt with DVT in LLE and PEs - on Zarelto.     Patient Stated Goals walk again, play guitar and sing, wife hopes he gets more use of hand and can do more of his dressing and bathing   Currently in Pain? Yes   Pain Score  5    Pain Location Wrist   Pain Onset More than a month ago   Pain Frequency Intermittent   Aggravating Factors  movement   Pain Relieving Factors reposistioning   Multiple Pain Sites Yes   Pain Score 1   Pain Location Buttocks   Pain Orientation Left;Right   Pain Descriptors / Indicators Dull   Pain Type Chronic pain   Pain Onset More than a month ago   Aggravating Factors  sitting   Pain Relieving Factors reposistioning     Treatment: Pt arrived wearing previous resting hand splint(prefab) It does not fit well and pt complains of wrist pain. Therapist initiated fabrication of custom splint and had pt wear for several mins while therapist provided education. Splint was  removed and reddened area was noted at radial wrist. Therapist did not issue splint as it requires further adjustment.  Plan to adjust and issue next visit.                           OT Short Term Goals - 07/25/14 1705    OT SHORT TERM GOAL #1   Title Pt and wife will be mod I with HEP - 08/16/2014   Status On-going   OT SHORT TERM GOAL #2   Title Pt will report no more then 3/10 pain with PROM of shoulder flexion to at least 90* to assist with self care.   Status On-going   OT SHORT TERM GOAL #3   Title Pt will require no more than moderate assist for LB dressing   Status On-going   OT SHORT TERM GOAL #4   Title Pt will require supervision for UB dressing, mod vc's   Status On-going   OT SHORT TERM GOAL #5   Title Pt will require no more than min a for toilet transfers   Status On-going   OT SHORT TERM GOAL #6   Title Pt will require min a to hike pants with toileting   Status On-going           OT Long Term Goals - 07/25/14 1705    OT LONG TERM GOAL #1   Title Pt and wife will be mod I with upgraded HEP PRN - 09/13/2014   Status On-going   OT LONG TERM GOAL #2   Title Pt will be min a with LB dressing   Status On-going   OT LONG TERM GOAL #3   Title Pt will be supervision UB bathing   Status On-going   OT LONG TERM GOAL #4   Title Pt will be mod a LB bathing   Status On-going   OT LONG TERM GOAL #5   Title Pt will be min a for transfer tub bench into shower   Status On-going   OT LONG TERM GOAL #6   Title Pt will require min a for simple hot meal prep at wheelchair level , standing at counter intermittently with close S   Status On-going   OT LONG TERM GOAL #7   Title Pt will recall 2 strategies for adapted bathing/dressing   Status On-going   OT LONG TERM GOAL #8   Title Pt will tolerate 20 minutes of functional activity without rest breaks   Status On-going               Plan - 07/28/14 1400    Clinical Impression Statement  Therapist initiated fabrication of resting hand splint for improved positioning  as previsous pre-fab splint doies not fit well.   Pt will benefit from skilled therapeutic intervention in order to improve on the following deficits (Retired) Decreased activity tolerance;Decreased balance;Decreased endurance;Decreased cognition;Decreased knowledge of use of DME;Decreased mobility;Decreased range of motion;Decreased safety awareness;Decreased strength;Impaired UE functional use;Impaired tone;Pain   Rehab Potential Good   OT Frequency 2x / week   OT Duration 8 weeks   OT Treatment/Interventions Self-care/ADL training;Moist Heat;Electrical Stimulation;DME and/or AE instruction;Neuromuscular education;Therapeutic exercise;Functional Mobility Training;Manual Therapy;Passive range of motion;Splinting;Therapeutic activities;Balance training;Patient/family education;Cognitive remediation/compensation   Plan Complete resting  hand splint and issue next visit   Consulted and Agree with Plan of Care Patient;Family member/caregiver   Family Member Consulted wife        Problem List Patient Active Problem List   Diagnosis Date Noted  . Dysphagia 07/26/2014  . Antiplatelet or antithrombotic long-term use 04/22/2014  . Chest pain 04/17/2014  . SOB (shortness of breath) 04/17/2014  . Bilateral pulmonary embolism 04/11/2014  . Left-sided neglect 03/09/2014  . Dysphagia, pharyngoesophageal phase 03/02/2014  . Arthralgia of left ankle 02/24/2014  . Hemiplegia affecting left dominant side 02/16/2014  . 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 Benign neoplasm of colon 02/10/2013  . Special screening for malignant neoplasms, colon 02/10/2013  . OSA on CPAP 01/06/2013  . S/P angioplasty with DES to CFX 07/01/11 07/02/2011  . Sleep apnea, "cant afford C-pap" 07/02/2011  . CAD, RCA PCI '09, 10/11, CABG X 4 5/12 06/30/2011  . Hypertension, B/P has been low this admission 06/30/2011  .  Hyperlipidemia 06/30/2011  . Morbid obesity 06/30/2011  . NSTEMI, 06/29/11 06/30/2011  . 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 Ischemic cardiomyopathy, EF 35-40 2D May 2012 06/30/2011  . CHF, acute, mild 06/30/2011    Syndey Jaskolski 07/28/2014, 2:06 PM Theone Murdoch, OTR/L [phone] Phone: [phone] 2:07 PM 07/28/2014 [facility] [address] [place], [place], [address] Phone: [phone]   Fax:  [phone]

## 2014-08-01 ENCOUNTER — Ambulatory Visit: Payer: 59 | Admitting: Physical Therapy

## 2014-08-01 ENCOUNTER — Encounter: Payer: Self-pay | Admitting: Physical Therapy

## 2014-08-01 ENCOUNTER — Ambulatory Visit: Payer: 59 | Admitting: Occupational Therapy

## 2014-08-01 ENCOUNTER — Telehealth: Payer: Self-pay | Admitting: Neurology

## 2014-08-01 ENCOUNTER — Encounter: Payer: Self-pay | Admitting: Occupational Therapy

## 2014-08-01 DIAGNOSIS — Z7409 Other reduced mobility: Secondary | ICD-10-CM

## 2014-08-01 DIAGNOSIS — G8110 Spastic hemiplegia affecting unspecified side: Secondary | ICD-10-CM

## 2014-08-01 DIAGNOSIS — R531 Weakness: Secondary | ICD-10-CM

## 2014-08-01 DIAGNOSIS — R29898 Other symptoms and signs involving the musculoskeletal system: Secondary | ICD-10-CM

## 2014-08-01 MED ORDER — GABAPENTIN 100 MG PO CAPS: 100.0000 mg | ORAL_CAPSULE | Freq: Two times a day (BID) | ORAL | Status: DC

## 2014-08-01 NOTE — Patient Instructions (Signed)
Pt and wife instructed in: Sit to stand, stand to sit, toilet transfers and allowing pt to  hike pants up and down at home when toileting with close supervision. Pt and wife to practice at home. Pt and wife also agreed to stop using lift function on lift chair at home.

## 2014-08-01 NOTE — Therapy (Signed)
[facility] [address] [place], [place], [address] Phone: [phone]   Fax:  [phone]  Occupational Therapy Treatment  Patient Details  Name: Gregory Wiley MRN: [id number] Date of Birth: 1950/04/16 Referring Provider:  Tamsen Roers, MD  Encounter Date: 08/01/2014      OT End of Session - 08/01/14 1606    Visit Number 4   Number of Visits 16   Date for OT Re-Evaluation 09/13/14   Authorization Type [employer]   OT Start Time 1315   OT Stop Time 1400   OT Time Calculation (min) 45 min   Activity Tolerance Patient tolerated treatment well      Past Medical History  Diagnosis Date  . Coronary artery disease     stent, then CABG 07/20/10  . MI (myocardial infarction)     NSTEMI- last cath 06/2011-Stent to LCX-DES, last nuc 07/30/11 low risk  . Diabetes mellitus     hx of, diet controlled  . Hypertension   . Hyperlipidemia   . Obesity (BMI 30-39.9)   . OSA on CPAP     since July 2013  . H/O cardiomyopathy     ischemic, now last echo 07/30/11, EF 38%W  . Diverticulosis   . Internal hemorrhoids   . Tubular adenoma of colon   . Plantar fasciitis of left foot   . Stroke     Past Surgical History  Procedure Laterality Date  . Coronary artery bypass graft  07/20/2010     LIMA-LAD; VG-ACUTE MARG of RCA; Seq VG-distal RCA & then pda  . Coronary angioplasty with stent placement  07/01/2011    DES-Resolute to native LCX  . Coronary angioplasty with stent placement  12/01/2007    COMPLEX 5 LESION PCI INCLUDING CUTTING BALLOON AND 4 CYPHER DESs  . Hernia repair  2006  . Knee surgery Right   . Hand surgery Right     to take glass out  . Colonoscopy N/A 02/10/2013    Procedure: COLONOSCOPY;  Surgeon: Ladene Artist, MD;  Location: [facility] ENDOSCOPY;  Service: Endoscopy;  Laterality: N/A;  . Retinal detachment surgery Right   . Loop recorder implant  02/15/2014    MDT LINQ implanted by Dr Rayann Heman for cryptogenic  stroke  . Tee without cardioversion N/A 02/15/2014    Procedure: TRANSESOPHAGEAL ECHOCARDIOGRAM (TEE);  Surgeon: Josue Hector, MD;  Location: [facility] ENDOSCOPY;  Service: Cardiovascular;  Laterality: N/A;  . Left heart catheterization with coronary/graft angiogram  07/01/2011    Procedure: LEFT HEART CATHETERIZATION WITH Beatrix Fetters;  Surgeon: Sanda Klein, MD;  Location: [place] CATH LAB;  Service: Cardiovascular;;  . Percutaneous coronary stent intervention (pci-s) Right 07/01/2011    Procedure: PERCUTANEOUS CORONARY STENT INTERVENTION (PCI-S);  Surgeon: Sanda Klein, MD;  Location: [facility] CATH LAB;  Service: Cardiovascular;  Laterality: Right;    There were no vitals filed for this visit.  Visit Diagnosis:  Impaired functional mobility and activity tolerance  Generalized weakness  Spastic hemiplegia, dominant side      Subjective Assessment - 08/01/14 1318    Subjective  Today is not a good day.   Patient is accompained by: Family member   Pertinent History see epic snapshot, pt with low orthostatic BP, pt with DVT in LLE and PEs - on Zarelto.     Patient Stated Goals walk again, play guitar and sing, wife hopes he gets more use of hand and can do more of his dressing and bathing   Currently  in Pain? Yes   Pain Score 4   when pt passively moves it   Pain Location Wrist   Pain Orientation Left   Pain Descriptors / Indicators Aching   Pain Type Chronic pain   Pain Onset More than a month ago   Pain Frequency Intermittent   Aggravating Factors  passive movement   Pain Relieving Factors rest   Multiple Pain Sites Yes   Pain Location Back  see PT note   Pain Location Hip  see PT note                      OT Treatments/Exercises (OP) - 08/01/14 0001    ADLs   Functional Mobility Practiced toilet tranfer with emphasis on active trunk with forward lean, staying in midline, and use LE's for sit to stand and stand to sit. Pt able to complete toilet transfer with  close supervision (Contact guard to walk into bathroom). Wife able to transfer pt with same amount of assist.  Practiced sit to stand and stand to sit from 17 inch chair to simulate lift chair at home. Pt and wife encouraged to stop using lift function and have pt complete sit to stand on his own. Also discussed having pt scoot pants up and down in the bathroom vs wife just doing it for him. Pt able to demonstrate need for only suprervision for dynamic standing balance during functional task today.    Neurological Re-education Exercises   Other Exercises 1 Neuro re ed to address sit to stand, standing balance and stand to sit with emphasis on finding midline, core control to free up RUE out of weightbearing to allow functional activity in preparation for clothes management for toileting. Pt required mod vc's and supevision only   Splinting   Splinting Issued resting hand splint as well as educatd pt and wife on how to build up tolerance, skin checks, care of splint and wearing schedule. Provded in writing also and wife able to verbalize understanding.                 OT Education - 08/01/14 1603    Education provided Yes   Education Details Functional mobility training, self care   Person(s) Educated Patient;Spouse   Methods Explanation;Demonstration;Verbal cues;Handout   Comprehension Verbalized understanding;Returned demonstration          OT Short Term Goals - 08/01/14 1604    OT SHORT TERM GOAL #1   Title Pt and wife will be mod I with HEP - 08/16/2014   Status On-going   OT SHORT TERM GOAL #2   Title Pt will report no more then 3/10 pain with PROM of shoulder flexion to at least 90* to assist with self care.   Status On-going   OT SHORT TERM GOAL #3   Title Pt will require no more than moderate assist for LB dressing   Status On-going   OT SHORT TERM GOAL #4   Title Pt will require supervision for UB dressing, mod vc's   Status On-going   OT SHORT TERM GOAL #5   Title Pt  will require no more than min a for toilet transfers   Status Achieved   OT SHORT TERM GOAL #6   Title Pt will require min a to hike pants with toileting   Status Achieved           OT Long Term Goals - 08/01/14 1604    OT LONG TERM GOAL #1  Title Pt and wife will be mod I with upgraded HEP PRN - 09/13/2014   Status On-going   OT LONG TERM GOAL #2   Title Pt will be min a with LB dressing   Status On-going   OT LONG TERM GOAL #3   Title Pt will be supervision UB bathing   Status On-going   OT LONG TERM GOAL #4   Title Pt will be mod a LB bathing   Status On-going   OT LONG TERM GOAL #5   Title Pt will be min a for transfer tub bench into shower   Status On-going   OT LONG TERM GOAL #6   Title Pt will require min a for simple hot meal prep at wheelchair level , standing at counter intermittently with close S   Status On-going   OT LONG TERM GOAL #7   Title Pt will recall 2 strategies for adapted bathing/dressing   Status On-going   OT LONG TERM GOAL #8   Title Pt will tolerate 20 minutes of functional activity without rest breaks   Status On-going               Plan - 08/01/14 1604    Clinical Impression Statement Pt making good progress toward goals. WIfe has been strongly encouraged to use strategies learned in therapy vs lifting pt at home. Wife and pt able to safely return demonstrate. See pt instruction for details.    Pt will benefit from skilled therapeutic intervention in order to improve on the following deficits (Retired) Decreased activity tolerance;Decreased balance;Decreased endurance;Decreased cognition;Decreased knowledge of use of DME;Decreased mobility;Decreased range of motion;Decreased safety awareness;Decreased strength;Impaired UE functional use;Impaired tone;Pain   Rehab Potential Good   Clinical Impairments Affecting Rehab Potential Pt will benefit from skilled OT to address the above deficits.   OT Frequency 2x / week   OT Duration 8 weeks    OT Treatment/Interventions Self-care/ADL training;Moist Heat;Electrical Stimulation;DME and/or AE instruction;Neuromuscular education;Therapeutic exercise;Functional Mobility Training;Manual Therapy;Passive range of motion;Splinting;Therapeutic activities;Balance training;Patient/family education;Cognitive remediation/compensation   Plan check on splint, dynamic standing balance, neuro re ed for LUE   Consulted and Agree with Plan of Care Patient;Family member/caregiver   Family Member Consulted wife        Problem List Patient Active Problem List   Diagnosis Date Noted  . Dysphagia 07/26/2014  . Antiplatelet or antithrombotic long-term use 04/22/2014  . Chest pain 04/17/2014  . SOB (shortness of breath) 04/17/2014  . Bilateral pulmonary embolism 04/11/2014  . Left-sided neglect 03/09/2014  . Dysphagia, pharyngoesophageal phase 03/02/2014  . Arthralgia of left ankle 02/24/2014  . Hemiplegia affecting left dominant side 02/16/2014  . 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 Benign neoplasm of colon 02/10/2013  . Special screening for malignant neoplasms, colon 02/10/2013  . OSA on CPAP 01/06/2013  . S/P angioplasty with DES to CFX 07/01/11 07/02/2011  . Sleep apnea, "cant afford C-pap" 07/02/2011  . CAD, RCA PCI '09, 10/11, CABG X 4 5/12 06/30/2011  . Hypertension, B/P has been low this admission 06/30/2011  . Hyperlipidemia 06/30/2011  . Morbid obesity 06/30/2011  . NSTEMI, 06/29/11 06/30/2011  . 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 Ischemic cardiomyopathy, EF 35-40 2D May 2012 06/30/2011  . CHF, acute, mild 06/30/2011    Quay Burow, OTR/L 08/01/2014, 4:09 PM  [facility] [address] [place], [place], [address] Phone: [phone]   Fax:  [phone]

## 2014-08-01 NOTE — Patient Instructions (Addendum)
Tip Card 1.The goal of habituation training is to assist in decreasing symptoms of vertigo, dizziness, or nausea provoked by specific head and body motions. 2.These exercises may initially increase symptoms; however, be persistent and work through symptoms. With repetition and time, the exercises will assist in reducing or eliminating symptoms. 3.Exercises should be stopped and discussed with the therapist if you experience any of the following: - Sudden change or fluctuation in hearing - New onset of ringing in the ears, or increase in current intensity - Any fluid discharge from the ear - Severe pain in neck or back - Extreme nausea  Copyright  VHI. All rights reserved.  Gaze Stabilization: Sitting   Keeping eyes on target on wall ~10N feet away, tilt head down 15-30 and move head side to side for _20___ seconds. Repeat while moving head up and down for ____ seconds. Repeat 3 times each way. Do __2__ sessions per day.   Copyright  VHI. All rights reserved.  Oculomotor: Saccades   Holding two targets positioned side by side 5-6 inches apart, move eyes quickly from target to target as head stays still. Move _10 times to each direction. Perform sitting. Repeat __2-3__ times per day.  Copyright  VHI. All rights reserved.

## 2014-08-01 NOTE — Telephone Encounter (Signed)
Patients wife called and requested a refill on the patients Rx. gabapentin (NEURONTIN) 100 MG capsule.Please call and advise.

## 2014-08-01 NOTE — Therapy (Signed)
[facility] [address] [place], [place], [address] Phone: [phone]   Fax:  [phone]  Physical Therapy Treatment  Patient Details  Name: Gregory Wiley MRN: [id number] Date of Birth: July 07, 1950 Referring Provider:  Tamsen Roers, MD  Encounter Date: 08/01/2014      PT End of Session - 08/01/14 1240    Visit Number 4   Number of Visits 17   Date for PT Re-Evaluation 09/17/14   Authorization Type [employer], but pt reported he should get Medicare next week. PT assigned G-codes in case he qualifies for Medicare.   PT Start Time 1235   PT Stop Time 1315   PT Time Calculation (min) 40 min   Equipment Utilized During Treatment Gait belt   Activity Tolerance Other (comment)  pt limited by dizziness/wooziness.   Behavior During Therapy [facility] for tasks assessed/performed      Past Medical History  Diagnosis Date  . Coronary artery disease     stent, then CABG 07/20/10  . MI (myocardial infarction)     NSTEMI- last cath 06/2011-Stent to LCX-DES, last nuc 07/30/11 low risk  . Diabetes mellitus     hx of, diet controlled  . Hypertension   . Hyperlipidemia   . Obesity (BMI 30-39.9)   . OSA on CPAP     since July 2013  . H/O cardiomyopathy     ischemic, now last echo 07/30/11, EF 38%W  . Diverticulosis   . Internal hemorrhoids   . Tubular adenoma of colon   . Plantar fasciitis of left foot   . Stroke     Past Surgical History  Procedure Laterality Date  . Coronary artery bypass graft  07/20/2010     LIMA-LAD; VG-ACUTE MARG of RCA; Seq VG-distal RCA & then pda  . Coronary angioplasty with stent placement  07/01/2011    DES-Resolute to native LCX  . Coronary angioplasty with stent placement  12/01/2007    COMPLEX 5 LESION PCI INCLUDING CUTTING BALLOON AND 4 CYPHER DESs  . Hernia repair  2006  . Knee surgery Right   . Hand surgery Right     to take glass out  . Colonoscopy N/A 02/10/2013    Procedure: COLONOSCOPY;   Surgeon: Ladene Artist, MD;  Location: [facility] ENDOSCOPY;  Service: Endoscopy;  Laterality: N/A;  . Retinal detachment surgery Right   . Loop recorder implant  02/15/2014    MDT LINQ implanted by Dr Rayann Heman for cryptogenic stroke  . Tee without cardioversion N/A 02/15/2014    Procedure: TRANSESOPHAGEAL ECHOCARDIOGRAM (TEE);  Surgeon: Josue Hector, MD;  Location: [facility] ENDOSCOPY;  Service: Cardiovascular;  Laterality: N/A;  . Left heart catheterization with coronary/graft angiogram  07/01/2011    Procedure: LEFT HEART CATHETERIZATION WITH Beatrix Fetters;  Surgeon: Sanda Klein, MD;  Location: [place] CATH LAB;  Service: Cardiovascular;;  . Percutaneous coronary stent intervention (pci-s) Right 07/01/2011    Procedure: PERCUTANEOUS CORONARY STENT INTERVENTION (PCI-S);  Surgeon: Sanda Klein, MD;  Location: [facility] CATH LAB;  Service: Cardiovascular;  Laterality: Right;    There were no vitals filed for this visit.  Visit Diagnosis:  Left leg weakness  Impaired functional mobility and activity tolerance  Generalized weakness      Subjective Assessment - 08/01/14 1239    Subjective No new complaints. No falls. Wrist pain only.   Currently in Pain? Yes   Pain Score 1    Pain Location Wrist   Pain Orientation Left   Pain Descriptors /  Indicators Aching   Pain Type Chronic pain   Pain Onset More than a month ago   Pain Frequency Constant   Aggravating Factors  movement   Pain Relieving Factors rest     Treatment: Habituation activities Issued seated gaze stabilization and oculomotor exercises for home. Increased symptoms reported with performing each one, that decreased with repetition. Trialed bending over to retrieve items from floor and up, no increase in symptoms reported so did not send this one home. Held on [place] and rolling due to pt's decreased mobility and the need for assistance to perform these in the home, however would benefit from trying these in clinic if dizziness  continues and pt able to with assist from therapy staff.  Exercises: Supine on mat Quad sets 5-10 sec hold x 10 Hip abduction AAROM x 10 reps on sliding board with pillowcase to decrease friction Heel slides on sliding board with cues for slow eccentric control when lowering leg x 10 reps. Min assist required for proper alignment and to assist through ROM.  Hip adduction strengthening: pillow squeezes with 5 second holds x 10 reps.  Bridges with LEs supported on red yoga ball. 5 second holds x 10 reps. Hamstring curls with yoga ball. Active assist to L LE. x 10 reps.          PT Short Term Goals - 07/27/14 2058    PT SHORT TERM GOAL #1   Title Pt will be independent in HEP to improve strength, balance, endurance, and safety. Target date: 08/16/14.   Status On-going   PT SHORT TERM GOAL #2   Title Perform BERG and write goals if appropriate. Target date: 08/16/14.   Status On-going   PT SHORT TERM GOAL #3   Title Pt will perform sit<>stand with UE assist at MOD I level to improve functional mobilty. Target date: 08/16/14.   Status On-going   PT SHORT TERM GOAL #4   Title Pt will ambulate 200' over even terrain with LRAD and supervision to improve functional mobility. Target date: 08/16/14.   Status On-going   PT SHORT TERM GOAL #5   Title Pt will perform TUG in </=90 seconds to reduce falls risk. Target date: 08/16/14.   Status On-going           PT Long Term Goals - 07/27/14 2058    PT LONG TERM GOAL #1   Title Pt will verbalize understanding of CVA signs/symptoms and risk factors to reduce risk of another CVA. Target date: 09/13/14.   Status On-going   PT LONG TERM GOAL #2   Title Pt will report no falls over the last four weeks to improve safety during functional mobility. Target date; 09/13/14.   Status On-going   PT LONG TERM GOAL #3   Title Pt will ambulate 400' over even/uneven terrain with LRAD at MOD I level to improve functional mobililty. Target date: 09/13/14.    Status On-going   PT LONG TERM GOAL #4   Title Pt's SIS-mobility score will improve by 10% to improve quality of life. Target date: 09/13/14.   Status On-going   PT LONG TERM GOAL #5   Title Pt's gait speed will improve to >/=1.36ft/sec. with LRAD to reduce falls risk. Target date: 09/13/14.   Status On-going            Plan - 08/01/14 1241    Clinical Impression Statement Educated on and issued habituation exercises today for home without issues. Continued with left leg strengthening today  as well. Pt continues to need cues on transfers for correct form/technique to decrease assist needed.                  Pt will benefit from skilled therapeutic intervention in order to improve on the following deficits Difficulty walking;Decreased safety awareness;Decreased endurance;Obesity;Decreased knowledge of use of DME;Decreased balance;Decreased mobility;Decreased strength;Impaired UE functional use;Other (comment)   Rehab Potential Good   PT Frequency 2x / week   PT Duration 8 weeks   PT Treatment/Interventions ADLs/Self Care Home Management;Gait training;Neuromuscular re-education;Biofeedback;Functional mobility training;Patient/family education;Cryotherapy;Therapeutic activities;Wheelchair mobility training;Electrical Stimulation;Therapeutic exercise;Manual techniques;DME Instruction;Balance training;Other (comment)   PT Next Visit Plan Continue supine L LE strengthening exercises as able, assess car transfer and educate as needed.   Consulted and Agree with Plan of Care Patient;Family member/caregiver   Family Member Consulted wife-Laura        Problem List Patient Active Problem List   Diagnosis Date Noted  . Dysphagia 07/26/2014  . Antiplatelet or antithrombotic long-term use 04/22/2014  . Chest pain 04/17/2014  . SOB (shortness of breath) 04/17/2014  . Bilateral pulmonary embolism 04/11/2014  . Left-sided neglect 03/09/2014  . Dysphagia, pharyngoesophageal phase 03/02/2014  .  Arthralgia of left ankle 02/24/2014  . Hemiplegia affecting left dominant side 02/16/2014  . 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 Benign neoplasm of colon 02/10/2013  . Special screening for malignant neoplasms, colon 02/10/2013  . OSA on CPAP 01/06/2013  . S/P angioplasty with DES to CFX 07/01/11 07/02/2011  . Sleep apnea, "cant afford C-pap" 07/02/2011  . CAD, RCA PCI '09, 10/11, CABG X 4 5/12 06/30/2011  . Hypertension, B/P has been low this admission 06/30/2011  . Hyperlipidemia 06/30/2011  . Morbid obesity 06/30/2011  . NSTEMI, 06/29/11 06/30/2011  . 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 Ischemic cardiomyopathy, EF 35-40 2D May 2012 06/30/2011  . CHF, acute, mild 06/30/2011    Willow Ora 08/02/2014, 10:34 AM  Willow Ora, PTA, [facility] [address] [place], [place] [address] [phone] 08/02/2014, 10:34 AM

## 2014-08-01 NOTE — Telephone Encounter (Signed)
I have spoken with [name] this am and advsied that I have escribed Gabapentin to [facility]

## 2014-08-02 ENCOUNTER — Ambulatory Visit: Payer: 59

## 2014-08-02 ENCOUNTER — Encounter: Payer: Self-pay | Admitting: Occupational Therapy

## 2014-08-02 ENCOUNTER — Ambulatory Visit: Payer: 59 | Admitting: Occupational Therapy

## 2014-08-02 VITALS — BP 101/78 | HR 85

## 2014-08-02 DIAGNOSIS — R29898 Other symptoms and signs involving the musculoskeletal system: Secondary | ICD-10-CM

## 2014-08-02 DIAGNOSIS — M25512 Pain in left shoulder: Secondary | ICD-10-CM

## 2014-08-02 DIAGNOSIS — R531 Weakness: Secondary | ICD-10-CM

## 2014-08-02 DIAGNOSIS — Z7409 Other reduced mobility: Secondary | ICD-10-CM

## 2014-08-02 DIAGNOSIS — G8110 Spastic hemiplegia affecting unspecified side: Secondary | ICD-10-CM

## 2014-08-02 NOTE — Therapy (Signed)
[facility] [address] [place], [place], [address] Phone: [phone]   Fax:  [phone]  Occupational Therapy Treatment  Patient Details  Name: Gregory Wiley MRN: [id number] Date of Birth: 01-Sep-1950 Referring Provider:  Tamsen Roers, MD  Encounter Date: 08/02/2014      OT End of Session - 08/02/14 1716    Visit Number 5   Number of Visits 16   Date for OT Re-Evaluation 09/13/14   Authorization Type [employer]   OT Start Time 1617   OT Stop Time 1700   OT Time Calculation (min) 43 min   Activity Tolerance Patient tolerated treatment well      Past Medical History  Diagnosis Date  . Coronary artery disease     stent, then CABG 07/20/10  . MI (myocardial infarction)     NSTEMI- last cath 06/2011-Stent to LCX-DES, last nuc 07/30/11 low risk  . Diabetes mellitus     hx of, diet controlled  . Hypertension   . Hyperlipidemia   . Obesity (BMI 30-39.9)   . OSA on CPAP     since July 2013  . H/O cardiomyopathy     ischemic, now last echo 07/30/11, EF 38%W  . Diverticulosis   . Internal hemorrhoids   . Tubular adenoma of colon   . Plantar fasciitis of left foot   . Stroke     Past Surgical History  Procedure Laterality Date  . Coronary artery bypass graft  07/20/2010     LIMA-LAD; VG-ACUTE MARG of RCA; Seq VG-distal RCA & then pda  . Coronary angioplasty with stent placement  07/01/2011    DES-Resolute to native LCX  . Coronary angioplasty with stent placement  12/01/2007    COMPLEX 5 LESION PCI INCLUDING CUTTING BALLOON AND 4 CYPHER DESs  . Hernia repair  2006  . Knee surgery Right   . Hand surgery Right     to take glass out  . Colonoscopy N/A 02/10/2013    Procedure: COLONOSCOPY;  Surgeon: Ladene Artist, MD;  Location: [facility] ENDOSCOPY;  Service: Endoscopy;  Laterality: N/A;  . Retinal detachment surgery Right   . Loop recorder implant  02/15/2014    MDT LINQ implanted by Dr Rayann Heman for cryptogenic  stroke  . Tee without cardioversion N/A 02/15/2014    Procedure: TRANSESOPHAGEAL ECHOCARDIOGRAM (TEE);  Surgeon: Josue Hector, MD;  Location: [facility] ENDOSCOPY;  Service: Cardiovascular;  Laterality: N/A;  . Left heart catheterization with coronary/graft angiogram  07/01/2011    Procedure: LEFT HEART CATHETERIZATION WITH Beatrix Fetters;  Surgeon: Sanda Klein, MD;  Location: [facility] CATH LAB;  Service: Cardiovascular;;  . Percutaneous coronary stent intervention (pci-s) Right 07/01/2011    Procedure: PERCUTANEOUS CORONARY STENT INTERVENTION (PCI-S);  Surgeon: Sanda Klein, MD;  Location: [facility] CATH LAB;  Service: Cardiovascular;  Laterality: Right;    Filed Vitals:   08/02/14 1628  BP: 101/78  Pulse: 85    Visit Diagnosis:  Spastic hemiplegia, dominant side  Pain in joint, shoulder region, left  Generalized weakness      Subjective Assessment - 08/02/14 1628    Subjective  I have a headache - I get one every day at 2 pm   Patient is accompained by: Family member   Pertinent History see epic snapshot, pt with low orthostatic BP, pt with DVT in LLE and PEs - on Zarelto.     Patient Stated Goals walk again, play guitar and sing, wife hopes he gets more use of  hand and can do more of his dressing and bathing   Currently in Pain? Yes   Pain Score 8    Pain Location Head  headache   Pain Orientation Posterior  back of neck to my temples   Pain Descriptors / Indicators Aching   Pain Type Chronic pain   Pain Onset 1 to 4 weeks ago   Pain Frequency Intermittent   Aggravating Factors  seems to come every day at 2 pm   Pain Relieving Factors tylenol, takng a nap                      OT Treatments/Exercises (OP) - 08/02/14 0001    Neurological Re-education Exercises   Other Weight-Bearing Exercises 1 Neuro re ed in supine to address active elbow flexion/extension with inconsistent effort from patient. Also addressed shoulder flexion in sitting and provided beginning  exercise for HEP - see pt instruction for details. Also addressed wrist alignment and wrist flexion/extension as pt was iniitally c/o pain. Pt with no c/o pain during treatment however.   Splinting   Splinting Pt's wife asked for straps to be adjusted and stated she didn't like the stockingnette used under the splint. Replaced stockinette with different material and replaced strapping. Pt tolerating splint well.                 OT Education - 08/02/14 1714    Education provided Yes   Education Details HEP for UE   Person(s) Educated Patient;Spouse   Methods Explanation;Demonstration;Tactile cues;Verbal cues;Handout   Comprehension Verbalized understanding;Returned demonstration          OT Short Term Goals - 08/02/14 1714    OT SHORT TERM GOAL #1   Title Pt and wife will be mod I with HEP - 08/16/2014   Status On-going   OT SHORT TERM GOAL #2   Title Pt will report no more then 3/10 pain with PROM of shoulder flexion to at least 90* to assist with self care.   Status On-going   OT SHORT TERM GOAL #3   Title Pt will require no more than moderate assist for LB dressing   Status On-going   OT SHORT TERM GOAL #4   Title Pt will require supervision for UB dressing, mod vc's   Status On-going   OT SHORT TERM GOAL #5   Title Pt will require no more than min a for toilet transfers   Status Achieved   OT SHORT TERM GOAL #6   Title Pt will require min a to hike pants with toileting   Status Achieved           OT Long Term Goals - 08/02/14 1714    OT LONG TERM GOAL #1   Title Pt and wife will be mod I with upgraded HEP PRN - 09/13/2014   Status On-going   OT LONG TERM GOAL #2   Title Pt will be min a with LB dressing   Status On-going   OT LONG TERM GOAL #3   Title Pt will be supervision UB bathing   Status On-going   OT LONG TERM GOAL #4   Title Pt will be mod a LB bathing   Status On-going   OT LONG TERM GOAL #5   Title Pt will be min a for transfer tub bench into  shower   Status On-going   OT LONG TERM GOAL #6   Title Pt will require min a for simple hot meal  prep at wheelchair level , standing at counter intermittently with close S   Status On-going   OT LONG TERM GOAL #7   Title Pt will recall 2 strategies for adapted bathing/dressing   Status On-going   OT LONG TERM GOAL #8   Title Pt will tolerate 20 minutes of functional activity without rest breaks   Status On-going               Plan - 08/02/14 1714    Clinical Impression Statement Pt with slow progress and with numerous somatic complaints during therapy. Pt and wife are working on pt being more active in sit to stand, stand to sit and managing pants during toileting. Pt stated "its irritating to have to do it myself" Explained that this will take practice to improve.   Pt will benefit from skilled therapeutic intervention in order to improve on the following deficits (Retired) Decreased activity tolerance;Decreased balance;Decreased endurance;Decreased cognition;Decreased knowledge of use of DME;Decreased mobility;Decreased range of motion;Decreased safety awareness;Decreased strength;Impaired UE functional use;Impaired tone;Pain   Rehab Potential Good   Clinical Impairments Affecting Rehab Potential Pt will benefit from skilled OT to address the above deficits.   OT Frequency 2x / week   OT Duration 8 weeks   OT Treatment/Interventions Self-care/ADL training;Moist Heat;Electrical Stimulation;DME and/or AE instruction;Neuromuscular education;Therapeutic exercise;Functional Mobility Training;Manual Therapy;Passive range of motion;Splinting;Therapeutic activities;Balance training;Patient/family education;Cognitive remediation/compensation   Plan check and add to HEP, standing balance, neuro re ed for LUE   Consulted and Agree with Plan of Care Patient;Family member/caregiver   Family Member Consulted wife        Problem List Patient Active Problem List   Diagnosis Date Noted  .  Dysphagia 07/26/2014  . Antiplatelet or antithrombotic long-term use 04/22/2014  . Chest pain 04/17/2014  . SOB (shortness of breath) 04/17/2014  . Bilateral pulmonary embolism 04/11/2014  . Left-sided neglect 03/09/2014  . Dysphagia, pharyngoesophageal phase 03/02/2014  . Arthralgia of left ankle 02/24/2014  . Hemiplegia affecting left dominant side 02/16/2014  . 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 Benign neoplasm of colon 02/10/2013  . Special screening for malignant neoplasms, colon 02/10/2013  . OSA on CPAP 01/06/2013  . S/P angioplasty with DES to CFX 07/01/11 07/02/2011  . Sleep apnea, "cant afford C-pap" 07/02/2011  . CAD, RCA PCI '09, 10/11, CABG X 4 5/12 06/30/2011  . Hypertension, B/P has been low this admission 06/30/2011  . Hyperlipidemia 06/30/2011  . Morbid obesity 06/30/2011  . NSTEMI, 06/29/11 06/30/2011  . 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 Ischemic cardiomyopathy, EF 35-40 2D May 2012 06/30/2011  . CHF, acute, mild 06/30/2011    Quay Burow, OTR/L 08/02/2014, 5:18 PM  [facility] [address] [place], [place], [address] Phone: [phone]   Fax:  [phone]

## 2014-08-02 NOTE — Therapy (Signed)
[facility] [address] [place], [place], [address] Phone: [phone]   Fax:  [phone]  Physical Therapy Treatment  Patient Details  Name: Gregory Wiley MRN: [id number] Date of Birth: 09-Mar-1951 Referring Provider:  Tamsen Roers, MD  Encounter Date: 08/02/2014      PT End of Session - 08/02/14 1904    Visit Number 5   Number of Visits 17   Date for PT Re-Evaluation 09/17/14   Authorization Type [employer], but pt reported he should get Medicare next week. PT assigned G-codes in case he qualifies for Medicare.   PT Start Time 1532   PT Stop Time 1613   PT Time Calculation (min) 41 min   Equipment Utilized During Treatment Gait belt   Activity Tolerance Patient tolerated treatment well   Behavior During Therapy WFL for tasks assessed/performed      Past Medical History  Diagnosis Date  . Coronary artery disease     stent, then CABG 07/20/10  . MI (myocardial infarction)     NSTEMI- last cath 06/2011-Stent to LCX-DES, last nuc 07/30/11 low risk  . Diabetes mellitus     hx of, diet controlled  . Hypertension   . Hyperlipidemia   . Obesity (BMI 30-39.9)   . OSA on CPAP     since July 2013  . H/O cardiomyopathy     ischemic, now last echo 07/30/11, EF 38%W  . Diverticulosis   . Internal hemorrhoids   . Tubular adenoma of colon   . Plantar fasciitis of left foot   . Stroke     Past Surgical History  Procedure Laterality Date  . Coronary artery bypass graft  07/20/2010     LIMA-LAD; VG-ACUTE MARG of RCA; Seq VG-distal RCA & then pda  . Coronary angioplasty with stent placement  07/01/2011    DES-Resolute to native LCX  . Coronary angioplasty with stent placement  12/01/2007    COMPLEX 5 LESION PCI INCLUDING CUTTING BALLOON AND 4 CYPHER DESs  . Hernia repair  2006  . Knee surgery Right   . Hand surgery Right     to take glass out  . Colonoscopy N/A 02/10/2013    Procedure: COLONOSCOPY;  Surgeon: Ladene Artist,  MD;  Location: [facility] ENDOSCOPY;  Service: Endoscopy;  Laterality: N/A;  . Retinal detachment surgery Right   . Loop recorder implant  02/15/2014    MDT LINQ implanted by Dr Rayann Heman for cryptogenic stroke  . Tee without cardioversion N/A 02/15/2014    Procedure: TRANSESOPHAGEAL ECHOCARDIOGRAM (TEE);  Surgeon: Josue Hector, MD;  Location: [facility] ENDOSCOPY;  Service: Cardiovascular;  Laterality: N/A;  . Left heart catheterization with coronary/graft angiogram  07/01/2011    Procedure: LEFT HEART CATHETERIZATION WITH Beatrix Fetters;  Surgeon: Sanda Klein, MD;  Location: [facility] CATH LAB;  Service: Cardiovascular;;  . Percutaneous coronary stent intervention (pci-s) Right 07/01/2011    Procedure: PERCUTANEOUS CORONARY STENT INTERVENTION (PCI-S);  Surgeon: Sanda Klein, MD;  Location: [facility] CATH LAB;  Service: Cardiovascular;  Laterality: Right;    There were no vitals filed for this visit.  Visit Diagnosis:  Impaired functional mobility and activity tolerance  Left leg weakness      Subjective Assessment - 08/02/14 1535    Subjective Pt denied falls or changes since last visit.    Patient is accompained by: Family member  [name]   Pertinent History MI, CABG, DM, CA   Patient Stated Goals walk better and longer distances   Currently in  Pain? Yes   Pain Score 1    Pain Location Buttocks   Pain Orientation Left   Pain Descriptors / Indicators Aching   Pain Type Chronic pain   Pain Onset More than a month ago   Pain Frequency Constant   Aggravating Factors  sitting on L side   Pain Relieving Factors standing       Neuro re-ed:  -In order to reiterate the importance of performing anterior weight shifting during sit to stand during car transfer: performed Sit<>stand x10, with cues to improve anterior weight shifting and to use ant/post momentum. Pt able to place L foot with R LE assist. Performed with min guard to supervision to ensure safety. Pt required one rest break during sit<>stands  due to lightheadedness and headache. -In standing: Lateral weight shifting side to side x20 with pt noted to improve L LE weight shifting with 1 UE support on w/c.  There act and Self-care: Pt performed w/c<>car stand pivot transfers x3 with min A to min guard with wife and PT. Cues for using R LE to assist L LE, demonstration for proper technique and cues to improve body mechanics for both pt and pt's wife. PT educated pt and pt's wife on the importance of good and safe body mechanics to reduce risk of injury.                         PT Education - 08/02/14 1904    Education provided Yes   Education Freight forwarder transfer technique and obtaining leg loop for L LE in order to assist during car transfer. PT discussed this with OT. PT also informed pt of AFO training/fitting on 5/25 with [employer].   Person(s) Educated Patient;Spouse   Methods Explanation;Demonstration;Verbal cues   Comprehension Verbalized understanding;Returned demonstration;Verbal cues required          PT Short Term Goals - 07/27/14 2058    PT SHORT TERM GOAL #1   Title Pt will be independent in HEP to improve strength, balance, endurance, and safety. Target date: 08/16/14.   Status On-going   PT SHORT TERM GOAL #2   Title Perform BERG and write goals if appropriate. Target date: 08/16/14.   Status On-going   PT SHORT TERM GOAL #3   Title Pt will perform sit<>stand with UE assist at MOD I level to improve functional mobilty. Target date: 08/16/14.   Status On-going   PT SHORT TERM GOAL #4   Title Pt will ambulate 200' over even terrain with LRAD and supervision to improve functional mobility. Target date: 08/16/14.   Status On-going   PT SHORT TERM GOAL #5   Title Pt will perform TUG in </=90 seconds to reduce falls risk. Target date: 08/16/14.   Status On-going           PT Long Term Goals - 07/27/14 2058    PT LONG TERM GOAL #1   Title Pt will verbalize understanding of CVA signs/symptoms and  risk factors to reduce risk of another CVA. Target date: 09/13/14.   Status On-going   PT LONG TERM GOAL #2   Title Pt will report no falls over the last four weeks to improve safety during functional mobility. Target date; 09/13/14.   Status On-going   PT LONG TERM GOAL #3   Title Pt will ambulate 400' over even/uneven terrain with LRAD at MOD I level to improve functional mobililty. Target date: 09/13/14.   Status On-going   PT  LONG TERM GOAL #4   Title Pt's SIS-mobility score will improve by 10% to improve quality of life. Target date: 09/13/14.   Status On-going   PT LONG TERM GOAL #5   Title Pt's gait speed will improve to >/=1.42ft/sec. with LRAD to reduce falls risk. Target date: 09/13/14.   Status On-going               Plan - 08/02/14 1905    Clinical Impression Statement Pt demonstrated progress as he was able to tolerate PT with less rest breaks and progressed from min guard to supervision during sit<>stand transfers. Pt was also able to use R LE to place L LE during transfers. PT and OT briefly discussed obtaining a custom L leg loop to assist pt during transfers, PT will f/u with OT for specific details. Continue with POC.   Pt will benefit from skilled therapeutic intervention in order to improve on the following deficits Difficulty walking;Decreased safety awareness;Decreased endurance;Obesity;Decreased knowledge of use of DME;Decreased balance;Decreased mobility;Decreased strength;Impaired UE functional use;Other (comment)   Rehab Potential Good   PT Frequency 2x / week   PT Duration 8 weeks   PT Treatment/Interventions ADLs/Self Care Home Management;Gait training;Neuromuscular re-education;Biofeedback;Functional mobility training;Patient/family education;Cryotherapy;Therapeutic activities;Wheelchair mobility training;Electrical Stimulation;Therapeutic exercise;Manual techniques;DME Instruction;Balance training;Other (comment)   PT Next Visit Plan Continue supine L LE  strengthening exercises as able, gait training.   Consulted and Agree with Plan of Care Patient;Family member/caregiver   Family Member Consulted wife-Laura        Problem List Patient Active Problem List   Diagnosis Date Noted  . Dysphagia 07/26/2014  . Antiplatelet or antithrombotic long-term use 04/22/2014  . Chest pain 04/17/2014  . SOB (shortness of breath) 04/17/2014  . Bilateral pulmonary embolism 04/11/2014  . Left-sided neglect 03/09/2014  . Dysphagia, pharyngoesophageal phase 03/02/2014  . Arthralgia of left ankle 02/24/2014  . Hemiplegia affecting left dominant side 02/16/2014  . 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 Benign neoplasm of colon 02/10/2013  . Special screening for malignant neoplasms, colon 02/10/2013  . OSA on CPAP 01/06/2013  . S/P angioplasty with DES to CFX 07/01/11 07/02/2011  . Sleep apnea, "cant afford C-pap" 07/02/2011  . CAD, RCA PCI '09, 10/11, CABG X 4 5/12 06/30/2011  . Hypertension, B/P has been low this admission 06/30/2011  . Hyperlipidemia 06/30/2011  . Morbid obesity 06/30/2011  . NSTEMI, 06/29/11 06/30/2011  . 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 Ischemic cardiomyopathy, EF 35-40 2D May 2012 06/30/2011  . CHF, acute, mild 06/30/2011    Jamirra Curnow L 08/02/2014, 7:08 PM  [facility] [address] [place], [place], [address] Phone: [phone]   Fax:  [phone]    Geoffry Paradise, PT,DPT 08/02/2014 7:08 PM Phone: [phone] Fax: [phone]

## 2014-08-02 NOTE — Patient Instructions (Signed)
Home program for Left arm:  Do these 2 times per day.   1. Sit on a firm surface.  Grasp left hand with your right hand. Reach toward the floor until elbow is straight. HOLD FOR A SLOW COUNT OF 3. Then sit back up. Do 10, rest then do 10 more.

## 2014-08-04 ENCOUNTER — Ambulatory Visit (HOSPITAL_COMMUNITY)
Admission: RE | Admit: 2014-08-04 | Discharge: 2014-08-04 | Disposition: A | Discharge status: Home / Self Care | Payer: 59 | Source: Ambulatory Visit | Attending: Neurology | Admitting: Neurology

## 2014-08-04 DIAGNOSIS — Z8673 Personal history of transient ischemic attack (TIA), and cerebral infarction without residual deficits: Secondary | ICD-10-CM | POA: Diagnosis not present

## 2014-08-04 DIAGNOSIS — R1310 Dysphagia, unspecified: Secondary | ICD-10-CM | POA: Insufficient documentation

## 2014-08-04 DIAGNOSIS — R1314 Dysphagia, pharyngoesophageal phase: Secondary | ICD-10-CM

## 2014-08-04 DIAGNOSIS — I63411 Cerebral infarction due to embolism of right middle cerebral artery: Secondary | ICD-10-CM

## 2014-08-04 LAB — CUP PACEART REMOTE DEVICE CHECK: Date Time Interrogation Session: 20160519111338

## 2014-08-09 ENCOUNTER — Ambulatory Visit: Payer: 59 | Admitting: Physical Therapy

## 2014-08-09 ENCOUNTER — Ambulatory Visit: Payer: 59 | Admitting: Occupational Therapy

## 2014-08-09 DIAGNOSIS — Z7409 Other reduced mobility: Secondary | ICD-10-CM

## 2014-08-09 DIAGNOSIS — G8110 Spastic hemiplegia affecting unspecified side: Secondary | ICD-10-CM

## 2014-08-09 DIAGNOSIS — R29898 Other symptoms and signs involving the musculoskeletal system: Secondary | ICD-10-CM

## 2014-08-09 DIAGNOSIS — M25512 Pain in left shoulder: Secondary | ICD-10-CM

## 2014-08-09 DIAGNOSIS — R531 Weakness: Secondary | ICD-10-CM

## 2014-08-09 NOTE — Therapy (Signed)
[facility] [address] [place], [place], [address] Phone: [phone]   Fax:  [phone]  Physical Therapy Treatment  Patient Details  Name: Gregory Wiley MRN: [id number] Date of Birth: 1951/03/08 Referring Provider:  Tamsen Roers, MD  Encounter Date: 08/09/2014      PT End of Session - 08/09/14 1452    Visit Number 6   Number of Visits 17   Date for PT Re-Evaluation 09/17/14   Authorization Type [employer], but pt reported he should get Medicare next week. PT assigned G-codes in case he qualifies for Medicare.   PT Start Time 1446   PT Stop Time 1530   PT Time Calculation (min) 44 min   Equipment Utilized During Treatment Gait belt   Activity Tolerance Patient tolerated treatment well   Behavior During Therapy WFL for tasks assessed/performed      Past Medical History  Diagnosis Date  . Coronary artery disease     stent, then CABG 07/20/10  . MI (myocardial infarction)     NSTEMI- last cath 06/2011-Stent to LCX-DES, last nuc 07/30/11 low risk  . Diabetes mellitus     hx of, diet controlled  . Hypertension   . Hyperlipidemia   . Obesity (BMI 30-39.9)   . OSA on CPAP     since July 2013  . H/O cardiomyopathy     ischemic, now last echo 07/30/11, EF 38%W  . Diverticulosis   . Internal hemorrhoids   . Tubular adenoma of colon   . Plantar fasciitis of left foot   . Stroke     Past Surgical History  Procedure Laterality Date  . Coronary artery bypass graft  07/20/2010     LIMA-LAD; VG-ACUTE MARG of RCA; Seq VG-distal RCA & then pda  . Coronary angioplasty with stent placement  07/01/2011    DES-Resolute to native LCX  . Coronary angioplasty with stent placement  12/01/2007    COMPLEX 5 LESION PCI INCLUDING CUTTING BALLOON AND 4 CYPHER DESs  . Hernia repair  2006  . Knee surgery Right   . Hand surgery Right     to take glass out  . Colonoscopy N/A 02/10/2013    Procedure: COLONOSCOPY;  Surgeon: Ladene Artist,  MD;  Location: [facility] ENDOSCOPY;  Service: Endoscopy;  Laterality: N/A;  . Retinal detachment surgery Right   . Loop recorder implant  02/15/2014    MDT LINQ implanted by Dr Rayann Heman for cryptogenic stroke  . Tee without cardioversion N/A 02/15/2014    Procedure: TRANSESOPHAGEAL ECHOCARDIOGRAM (TEE);  Surgeon: Josue Hector, MD;  Location: [facility] ENDOSCOPY;  Service: Cardiovascular;  Laterality: N/A;  . Left heart catheterization with coronary/graft angiogram  07/01/2011    Procedure: LEFT HEART CATHETERIZATION WITH Beatrix Fetters;  Surgeon: Sanda Klein, MD;  Location: [facility] CATH LAB;  Service: Cardiovascular;;  . Percutaneous coronary stent intervention (pci-s) Right 07/01/2011    Procedure: PERCUTANEOUS CORONARY STENT INTERVENTION (PCI-S);  Surgeon: Sanda Klein, MD;  Location: [facility] CATH LAB;  Service: Cardiovascular;  Laterality: Right;    There were no vitals filed for this visit.  Visit Diagnosis:  Left leg weakness  Generalized weakness  Impaired functional mobility and activity tolerance      Subjective Assessment - 08/09/14 1448    Subjective No new complaints. No falls to report. Was able to walk up stairs at daugthers home with bil rail and 3 people for saftey, no buckling was noted of his leg.   Currently in Pain? Yes  Pain Score 5    Pain Location Head   Pain Orientation Posterior  back of neck to temples   Pain Descriptors / Indicators Aching;Headache   Pain Type Chronic pain   Pain Onset 1 to 4 weeks ago   Pain Frequency Intermittent   Aggravating Factors  starts around 2pm everyday   Pain Relieving Factors tylenol, rest     Treatment: Seated edge of mat: - gentle manually resisted/assisted DF/PF x 10 reps, attempted band assisted DF, resisted PF however pt unable to due to too much resistance. - long arc quads left leg, 5 sec hold x 10 reps - foot on pillow case on slide board: heel slides x 10 reps active assist  Sit/stand x 3 min guard assist, cues on  sequence and technique  Stdg: with mirror for visual feedback Midline position hold with emphasis on posture and equal weight bearing Left stance: right leg stepping fwd/bwd x 10 reps, 2 sets Right stance: left stepping fwd/bwd with assist x 10 reps  Sit<> supine min assist with cues on technique/sequence   Supine: with left foot on pillow case on slide board - heel slides with assist for control x 10 reps - hip abduction/adduciton x 10 reps  Std/pivot transfer min guard assist with large based quad cane from mat to wheelchair with cues on posture and sequence.        PT Short Term Goals - 07/27/14 2058    PT SHORT TERM GOAL #1   Title Pt will be independent in HEP to improve strength, balance, endurance, and safety. Target date: 08/16/14.   Status On-going   PT SHORT TERM GOAL #2   Title Perform BERG and write goals if appropriate. Target date: 08/16/14.   Status On-going   PT SHORT TERM GOAL #3   Title Pt will perform sit<>stand with UE assist at MOD I level to improve functional mobilty. Target date: 08/16/14.   Status On-going   PT SHORT TERM GOAL #4   Title Pt will ambulate 200' over even terrain with LRAD and supervision to improve functional mobility. Target date: 08/16/14.   Status On-going   PT SHORT TERM GOAL #5   Title Pt will perform TUG in </=90 seconds to reduce falls risk. Target date: 08/16/14.   Status On-going           PT Long Term Goals - 07/27/14 2058    PT LONG TERM GOAL #1   Title Pt will verbalize understanding of CVA signs/symptoms and risk factors to reduce risk of another CVA. Target date: 09/13/14.   Status On-going   PT LONG TERM GOAL #2   Title Pt will report no falls over the last four weeks to improve safety during functional mobility. Target date; 09/13/14.   Status On-going   PT LONG TERM GOAL #3   Title Pt will ambulate 400' over even/uneven terrain with LRAD at MOD I level to improve functional mobililty. Target date: 09/13/14.   Status  On-going   PT LONG TERM GOAL #4   Title Pt's SIS-mobility score will improve by 10% to improve quality of life. Target date: 09/13/14.   Status On-going   PT LONG TERM GOAL #5   Title Pt's gait speed will improve to >/=1.4ft/sec. with LRAD to reduce falls risk. Target date: 09/13/14.   Status On-going            Plan - 08/09/14 1452    Clinical Impression Statement Focued on lower extremety strengthening today with no  complaints. No buckling of left leg with standing activities. Pt progressing toward goals.   Pt will benefit from skilled therapeutic intervention in order to improve on the following deficits Difficulty walking;Decreased safety awareness;Decreased endurance;Obesity;Decreased knowledge of use of DME;Decreased balance;Decreased mobility;Decreased strength;Impaired UE functional use;Other (comment)   Rehab Potential Good   PT Frequency 2x / week   PT Duration 8 weeks   PT Treatment/Interventions ADLs/Self Care Home Management;Gait training;Neuromuscular re-education;Biofeedback;Functional mobility training;Patient/family education;Cryotherapy;Therapeutic activities;Wheelchair mobility training;Electrical Stimulation;Therapeutic exercise;Manual techniques;DME Instruction;Balance training;Other (comment)   PT Next Visit Plan Continue supine L LE strengthening exercises as able, gait training.   Consulted and Agree with Plan of Care Patient;Family member/caregiver   Family Member Consulted wife-Laura        Problem List Patient Active Problem List   Diagnosis Date Noted  . Dysphagia 07/26/2014  . Antiplatelet or antithrombotic long-term use 04/22/2014  . Chest pain 04/17/2014  . SOB (shortness of breath) 04/17/2014  . Bilateral pulmonary embolism 04/11/2014  . Left-sided neglect 03/09/2014  . Dysphagia, pharyngoesophageal phase 03/02/2014  . Arthralgia of left ankle 02/24/2014  . Hemiplegia affecting left dominant side 02/16/2014  . 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 Benign neoplasm of colon 02/10/2013  . Special screening for malignant neoplasms, colon 02/10/2013  . OSA on CPAP 01/06/2013  . S/P angioplasty with DES to CFX 07/01/11 07/02/2011  . Sleep apnea, "cant afford C-pap" 07/02/2011  . CAD, RCA PCI '09, 10/11, CABG X 4 5/12 06/30/2011  . Hypertension, B/P has been low this admission 06/30/2011  . Hyperlipidemia 06/30/2011  . Morbid obesity 06/30/2011  . NSTEMI, 06/29/11 06/30/2011  . 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 Ischemic cardiomyopathy, EF 35-40 2D May 2012 06/30/2011  . CHF, acute, mild 06/30/2011    Willow Ora 08/10/2014, 8:27 AM  Willow Ora, PTA, [facility] [address], [place], [place] [address] [phone] 08/10/2014, 8:28 AM

## 2014-08-09 NOTE — Therapy (Signed)
[facility] [address] [place], [place], [address] Phone: [phone]   Fax:  [phone]  Occupational Therapy Treatment  Patient Details  Name: Gregory Wiley MRN: [id number] Date of Birth: Sep 28, 1950 Referring Provider:  Tamsen Roers, MD  Encounter Date: 08/09/2014      OT End of Session - 08/09/14 1644    Visit Number 6   Authorization Type [employer]   OT Start Time 1404   OT Stop Time 1445   OT Time Calculation (min) 41 min   Activity Tolerance Patient tolerated treatment well   Behavior During Therapy [facility] for tasks assessed/performed      Past Medical History  Diagnosis Date  . Coronary artery disease     stent, then CABG 07/20/10  . MI (myocardial infarction)     NSTEMI- last cath 06/2011-Stent to LCX-DES, last nuc 07/30/11 low risk  . Diabetes mellitus     hx of, diet controlled  . Hypertension   . Hyperlipidemia   . Obesity (BMI 30-39.9)   . OSA on CPAP     since July 2013  . H/O cardiomyopathy     ischemic, now last echo 07/30/11, EF 38%W  . Diverticulosis   . Internal hemorrhoids   . Tubular adenoma of colon   . Plantar fasciitis of left foot   . Stroke     Past Surgical History  Procedure Laterality Date  . Coronary artery bypass graft  07/20/2010     LIMA-LAD; VG-ACUTE MARG of RCA; Seq VG-distal RCA & then pda  . Coronary angioplasty with stent placement  07/01/2011    DES-Resolute to native LCX  . Coronary angioplasty with stent placement  12/01/2007    COMPLEX 5 LESION PCI INCLUDING CUTTING BALLOON AND 4 CYPHER DESs  . Hernia repair  2006  . Knee surgery Right   . Hand surgery Right     to take glass out  . Colonoscopy N/A 02/10/2013    Procedure: COLONOSCOPY;  Surgeon: Ladene Artist, MD;  Location: [facility] ENDOSCOPY;  Service: Endoscopy;  Laterality: N/A;  . Retinal detachment surgery Right   . Loop recorder implant  02/15/2014    MDT LINQ implanted by Dr Rayann Heman for cryptogenic  stroke  . Tee without cardioversion N/A 02/15/2014    Procedure: TRANSESOPHAGEAL ECHOCARDIOGRAM (TEE);  Surgeon: Josue Hector, MD;  Location: [facility] ENDOSCOPY;  Service: Cardiovascular;  Laterality: N/A;  . Left heart catheterization with coronary/graft angiogram  07/01/2011    Procedure: LEFT HEART CATHETERIZATION WITH Beatrix Fetters;  Surgeon: Sanda Klein, MD;  Location: [facility] CATH LAB;  Service: Cardiovascular;;  . Percutaneous coronary stent intervention (pci-s) Right 07/01/2011    Procedure: PERCUTANEOUS CORONARY STENT INTERVENTION (PCI-S);  Surgeon: Sanda Klein, MD;  Location: [facility] CATH LAB;  Service: Cardiovascular;  Laterality: Right;    There were no vitals filed for this visit.  Visit Diagnosis:  Left leg weakness  Spastic hemiplegia, dominant side  Pain in joint, shoulder region, left  Generalized weakness      Subjective Assessment - 08/09/14 1407    Subjective  My usual 2 pm headache   Patient is accompained by: Family member   Pertinent History see epic snapshot, pt with low orthostatic BP, pt with DVT in LLE and PEs - on Zarelto.     Currently in Pain? Yes   Pain Score 5    Pain Location Head   Pain Onset 1 to 4 weeks ago   Pain Frequency Intermittent  Aggravating Factors   occurs at 2 pm    Pain Relieving Factors tylenol, rest   Multiple Pain Sites No         Treatment: Self care: Pt practiced donning/ doffing shirt with hemi- techniques, with demonstration and min-mod v.c. Pt returned demonstration with set up/ supervision .Neuro re-ed : Reviewed HEP, 2 sets of 10 reps, min v.c. Then pt transferred to mat with min A,(on second attempt, pt stood with his foot turned initially and pt had to reattempt with therapist assist) Gentle shoulder flexion with weightbearing through UE Ranger seated edge of mat,max facilitation And horizontal abduction with hemiglide, max facilitation.                        OT Short Term Goals - 08/02/14 1714     OT SHORT TERM GOAL #1   Title Pt and wife will be mod I with HEP - 08/16/2014   Status On-going   OT SHORT TERM GOAL #2   Title Pt will report no more then 3/10 pain with PROM of shoulder flexion to at least 90* to assist with self care.   Status On-going   OT SHORT TERM GOAL #3   Title Pt will require no more than moderate assist for LB dressing   Status On-going   OT SHORT TERM GOAL #4   Title Pt will require supervision for UB dressing, mod vc's   Status On-going   OT SHORT TERM GOAL #5   Title Pt will require no more than min a for toilet transfers   Status Achieved   OT SHORT TERM GOAL #6   Title Pt will require min a to hike pants with toileting   Status Achieved           OT Long Term Goals - 08/02/14 1714    OT LONG TERM GOAL #1   Title Pt and wife will be mod I with upgraded HEP PRN - 09/13/2014   Status On-going   OT LONG TERM GOAL #2   Title Pt will be min a with LB dressing   Status On-going   OT LONG TERM GOAL #3   Title Pt will be supervision UB bathing   Status On-going   OT LONG TERM GOAL #4   Title Pt will be mod a LB bathing   Status On-going   OT LONG TERM GOAL #5   Title Pt will be min a for transfer tub bench into shower   Status On-going   OT LONG TERM GOAL #6   Title Pt will require min a for simple hot meal prep at wheelchair level , standing at counter intermittently with close S   Status On-going   OT LONG TERM GOAL #7   Title Pt will recall 2 strategies for adapted bathing/dressing   Status On-going   OT LONG TERM GOAL #8   Title Pt will tolerate 20 minutes of functional activity without rest breaks   Status On-going               Plan - 08/09/14 1642    Clinical Impression Statement Pt is slowly progressing towards goals limited by pain and decreased awareness.    Pt will benefit from skilled therapeutic intervention in order to improve on the following deficits (Retired) Decreased activity tolerance;Decreased  balance;Decreased endurance;Decreased cognition;Decreased knowledge of use of DME;Decreased mobility;Decreased range of motion;Decreased safety awareness;Decreased strength;Impaired UE functional use;Impaired tone;Pain   OT Frequency 2x / week  OT Duration 8 weeks   Plan progess HEP, neuro re-ed   Consulted and Agree with Plan of Care Patient;Family member/caregiver   Family Member Consulted wife        Problem List Patient Active Problem List   Diagnosis Date Noted  . Dysphagia 07/26/2014  . Antiplatelet or antithrombotic long-term use 04/22/2014  . Chest pain 04/17/2014  . SOB (shortness of breath) 04/17/2014  . Bilateral pulmonary embolism 04/11/2014  . Left-sided neglect 03/09/2014  . Dysphagia, pharyngoesophageal phase 03/02/2014  . Arthralgia of left ankle 02/24/2014  . Hemiplegia affecting left dominant side 02/16/2014  . 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 Benign neoplasm of colon 02/10/2013  . Special screening for malignant neoplasms, colon 02/10/2013  . OSA on CPAP 01/06/2013  . S/P angioplasty with DES to CFX 07/01/11 07/02/2011  . Sleep apnea, "cant afford C-pap" 07/02/2011  . CAD, RCA PCI '09, 10/11, CABG X 4 5/12 06/30/2011  . Hypertension, B/P has been low this admission 06/30/2011  . Hyperlipidemia 06/30/2011  . Morbid obesity 06/30/2011  . NSTEMI, 06/29/11 06/30/2011  . 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 Ischemic cardiomyopathy, EF 35-40 2D May 2012 06/30/2011  . CHF, acute, mild 06/30/2011    Anyelo Mccue 08/09/2014, 4:46 PM Theone Murdoch, OTR/L [phone] Phone: [phone] 4:46 PM 08/09/2014 [facility] [address] [place], [place], [address] Phone: [phone]   Fax:  [phone]

## 2014-08-10 ENCOUNTER — Ambulatory Visit: Payer: 59 | Admitting: Occupational Therapy

## 2014-08-10 ENCOUNTER — Ambulatory Visit: Payer: 59

## 2014-08-10 VITALS — BP 128/82

## 2014-08-10 DIAGNOSIS — M25512 Pain in left shoulder: Secondary | ICD-10-CM

## 2014-08-10 DIAGNOSIS — R531 Weakness: Secondary | ICD-10-CM | POA: Diagnosis not present

## 2014-08-10 DIAGNOSIS — R29898 Other symptoms and signs involving the musculoskeletal system: Secondary | ICD-10-CM

## 2014-08-10 DIAGNOSIS — G8110 Spastic hemiplegia affecting unspecified side: Secondary | ICD-10-CM

## 2014-08-10 DIAGNOSIS — Z7409 Other reduced mobility: Secondary | ICD-10-CM

## 2014-08-10 DIAGNOSIS — R262 Difficulty in walking, not elsewhere classified: Secondary | ICD-10-CM

## 2014-08-10 NOTE — Therapy (Signed)
[facility] [address] [place], [place], [address] Phone: [phone]   Fax:  [phone]  Physical Therapy Treatment  Patient Details  Name: Gregory Wiley MRN: [id number] Date of Birth: 1950-09-20 Referring Provider:  Tamsen Roers, MD  Encounter Date: 08/10/2014      PT End of Session - 08/10/14 0958    Visit Number 7   Number of Visits 17   Date for PT Re-Evaluation 09/17/14   Authorization Type [employer], but pt reported he should get Medicare next week. PT assigned G-codes in case he qualifies for Medicare.   PT Start Time [phone]   PT Stop Time 0927   PT Time Calculation (min) 41 min   Equipment Utilized During Treatment Gait belt   Activity Tolerance Patient tolerated treatment well   Behavior During Therapy WFL for tasks assessed/performed      Past Medical History  Diagnosis Date  . Coronary artery disease     stent, then CABG 07/20/10  . MI (myocardial infarction)     NSTEMI- last cath 06/2011-Stent to LCX-DES, last nuc 07/30/11 low risk  . Diabetes mellitus     hx of, diet controlled  . Hypertension   . Hyperlipidemia   . Obesity (BMI 30-39.9)   . OSA on CPAP     since July 2013  . H/O cardiomyopathy     ischemic, now last echo 07/30/11, EF 38%W  . Diverticulosis   . Internal hemorrhoids   . Tubular adenoma of colon   . Plantar fasciitis of left foot   . Stroke     Past Surgical History  Procedure Laterality Date  . Coronary artery bypass graft  07/20/2010     LIMA-LAD; VG-ACUTE MARG of RCA; Seq VG-distal RCA & then pda  . Coronary angioplasty with stent placement  07/01/2011    DES-Resolute to native LCX  . Coronary angioplasty with stent placement  12/01/2007    COMPLEX 5 LESION PCI INCLUDING CUTTING BALLOON AND 4 CYPHER DESs  . Hernia repair  2006  . Knee surgery Right   . Hand surgery Right     to take glass out  . Colonoscopy N/A 02/10/2013    Procedure: COLONOSCOPY;  Surgeon: Ladene Artist,  MD;  Location: [facility];  Service: Endoscopy;  Laterality: N/A;  . Retinal detachment surgery Right   . Loop recorder implant  02/15/2014    MDT LINQ implanted by Dr Rayann Heman for cryptogenic stroke  . Tee without cardioversion N/A 02/15/2014    Procedure: TRANSESOPHAGEAL ECHOCARDIOGRAM (TEE);  Surgeon: Josue Hector, MD;  Location: [facility] ENDOSCOPY;  Service: Cardiovascular;  Laterality: N/A;  . Left heart catheterization with coronary/graft angiogram  07/01/2011    Procedure: LEFT HEART CATHETERIZATION WITH Beatrix Fetters;  Surgeon: Sanda Klein, MD;  Location: [facility] CATH LAB;  Service: Cardiovascular;;  . Percutaneous coronary stent intervention (pci-s) Right 07/01/2011    Procedure: PERCUTANEOUS CORONARY STENT INTERVENTION (PCI-S);  Surgeon: Sanda Klein, MD;  Location: [facility] CATH LAB;  Service: Cardiovascular;  Laterality: Right;    There were no vitals filed for this visit.  Visit Diagnosis:  Difficulty walking  Left leg weakness      Subjective Assessment - 08/10/14 0848    Subjective Pt denied falls or changes since last visit.   Patient is accompained by: Family member  [name]   Pertinent History MI, CABG, DM, CA   Patient Stated Goals walk better and longer distances   Currently in Pain? No/denies  [facility] PT Treatment/Exercise - 08/10/14 0953    Ambulation/Gait   Ambulation/Gait Yes   Ambulation/Gait Assistance 4: Min guard;4: Min assist   Ambulation/Gait Assistance Details Pt ambulated with min guard to min A without L reaction AFO donned, pt ambulated with L reaction AFO donned and L simulated toe cap, with min guard. Cues to improve stride length, upright posture, decrease R lateral trunk lean and to improve L foot clearance. Pt's gait impairments and safety improved with L AFO donned. Pt continues to experience L genu recurvatum with AFO donned but it decreases with L heel wedge. Pt required two seated rest breaks during  ambulation due fatigue.   Ambulation Distance (Feet) --  53' without AFO, and 29' and 42' with AFO   Assistive device Large base quad cane   Gait Pattern Step-to pattern;Decreased stance time - left;Decreased step length - right;Left circumduction;Left foot flat;Decreased hip/knee flexion - left;Left genu recurvatum   Ambulation Surface Level;Indoor                PT Education - 08/10/14 0957    Education provided Yes   Education Details PT and orthotist Gerald Stabs from [facility]) educated pt extensively on the benefits of L anterior reaction AFO, along with L toe cap, and heel wedge.  Pt was taught how to donn/doff AFO and the importance of purchasing a new shoe that is light and will accommodate a heel wedge to decrease L genu recurvatum.    Person(s) Educated Patient;Spouse   Methods Explanation;Demonstration;Verbal cues   Comprehension Verbalized understanding;Returned demonstration          PT Short Term Goals - 07/27/14 2058    PT SHORT TERM GOAL #1   Title Pt will be independent in HEP to improve strength, balance, endurance, and safety. Target date: 08/16/14.   Status On-going   PT SHORT TERM GOAL #2   Title Perform BERG and write goals if appropriate. Target date: 08/16/14.   Status On-going   PT SHORT TERM GOAL #3   Title Pt will perform sit<>stand with UE assist at MOD I level to improve functional mobilty. Target date: 08/16/14.   Status On-going   PT SHORT TERM GOAL #4   Title Pt will ambulate 200' over even terrain with LRAD and supervision to improve functional mobility. Target date: 08/16/14.   Status On-going   PT SHORT TERM GOAL #5   Title Pt will perform TUG in </=90 seconds to reduce falls risk. Target date: 08/16/14.   Status On-going           PT Long Term Goals - 07/27/14 2058    PT LONG TERM GOAL #1   Title Pt will verbalize understanding of CVA signs/symptoms and risk factors to reduce risk of another CVA. Target date: 09/13/14.   Status On-going    PT LONG TERM GOAL #2   Title Pt will report no falls over the last four weeks to improve safety during functional mobility. Target date; 09/13/14.   Status On-going   PT LONG TERM GOAL #3   Title Pt will ambulate 400' over even/uneven terrain with LRAD at MOD I level to improve functional mobililty. Target date: 09/13/14.   Status On-going   PT LONG TERM GOAL #4   Title Pt's SIS-mobility score will improve by 10% to improve quality of life. Target date: 09/13/14.   Status On-going   PT LONG TERM GOAL #5   Title Pt's gait speed will improve to >/=1.5ft/sec. with LRAD to reduce falls  risk. Target date: 09/13/14.   Status On-going               Plan - 08/10/14 3536    Clinical Impression Statement Pt demonstrated progress as he was able to ambulate longer distances today. Pt also demonstrated improved safety, improved L foot clearance, deceased R lateral trunk lean, and improved balance during ambulation with L anterior reaction AFO, L simulated toe cap, and L heel wedge during ambulation. Pt would benefit from L AFO, heel wedge and toe cap donned during ambulation at all times. Pt signed release of information so PT could fax notes and MD prescription to [facility]. Continue with POC.   Pt will benefit from skilled therapeutic intervention in order to improve on the following deficits Difficulty walking;Decreased safety awareness;Decreased endurance;Obesity;Decreased knowledge of use of DME;Decreased balance;Decreased mobility;Decreased strength;Impaired UE functional use;Other (comment)  orthotic training/fitting   Rehab Potential Good   PT Frequency 2x / week   PT Duration 8 weeks   PT Treatment/Interventions ADLs/Self Care Home Management;Gait training;Neuromuscular re-education;Biofeedback;Functional mobility training;Patient/family education;Cryotherapy;Therapeutic activities;Wheelchair mobility training;Electrical Stimulation;Therapeutic exercise;Manual techniques;DME  Instruction;Balance training;Other (comment)  orthotic training/fitting   PT Next Visit Plan Check STGs   Consulted and Agree with Plan of Care Patient;Family member/caregiver   Family Member Consulted wife-Laura        Problem List Patient Active Problem List   Diagnosis Date Noted  . Dysphagia 07/26/2014  . Antiplatelet or antithrombotic long-term use 04/22/2014  . Chest pain 04/17/2014  . SOB (shortness of breath) 04/17/2014  . Bilateral pulmonary embolism 04/11/2014  . Left-sided neglect 03/09/2014  . Dysphagia, pharyngoesophageal phase 03/02/2014  . Arthralgia of left ankle 02/24/2014  . Hemiplegia affecting left dominant side 02/16/2014  . 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 Benign neoplasm of colon 02/10/2013  . Special screening for malignant neoplasms, colon 02/10/2013  . OSA on CPAP 01/06/2013  . S/P angioplasty with DES to CFX 07/01/11 07/02/2011  . Sleep apnea, "cant afford C-pap" 07/02/2011  . CAD, RCA PCI '09, 10/11, CABG X 4 5/12 06/30/2011  . Hypertension, B/P has been low this admission 06/30/2011  . Hyperlipidemia 06/30/2011  . Morbid obesity 06/30/2011  . NSTEMI, 06/29/11 06/30/2011  . 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 Ischemic cardiomyopathy, EF 35-40 2D May 2012 06/30/2011  . CHF, acute, mild 06/30/2011    Zoie Sarin L 08/10/2014, 10:03 AM  [facility] [address] [place], [place], [address] Phone: [phone]   Fax:  [phone]      Geoffry Paradise, PT,DPT 08/10/2014 10:03 AM Phone: [phone] Fax: [phone]

## 2014-08-10 NOTE — Therapy (Signed)
[facility] [address] [place], [place], [address] Phone: [phone]   Fax:  [phone]  Occupational Therapy Treatment  Patient Details  Name: Gregory Wiley MRN: [id number] Date of Birth: September 24, 1950 Referring Provider:  Tamsen Roers, MD  Encounter Date: 08/10/2014      OT End of Session - 08/10/14 0813    Visit Number 7   Number of Visits 16   Date for OT Re-Evaluation 09/13/14   Authorization Type [employer]   OT Start Time [phone]   OT Stop Time 0845   OT Time Calculation (min) 40 min   Activity Tolerance Patient tolerated treatment well   Behavior During Therapy [facility] for tasks assessed/performed      Past Medical History  Diagnosis Date  . Coronary artery disease     stent, then CABG 07/20/10  . MI (myocardial infarction)     NSTEMI- last cath 06/2011-Stent to LCX-DES, last nuc 07/30/11 low risk  . Diabetes mellitus     hx of, diet controlled  . Hypertension   . Hyperlipidemia   . Obesity (BMI 30-39.9)   . OSA on CPAP     since July 2013  . H/O cardiomyopathy     ischemic, now last echo 07/30/11, EF 38%W  . Diverticulosis   . Internal hemorrhoids   . Tubular adenoma of colon   . Plantar fasciitis of left foot   . Stroke     Past Surgical History  Procedure Laterality Date  . Coronary artery bypass graft  07/20/2010     LIMA-LAD; VG-ACUTE MARG of RCA; Seq VG-distal RCA & then pda  . Coronary angioplasty with stent placement  07/01/2011    DES-Resolute to native LCX  . Coronary angioplasty with stent placement  12/01/2007    COMPLEX 5 LESION PCI INCLUDING CUTTING BALLOON AND 4 CYPHER DESs  . Hernia repair  2006  . Knee surgery Right   . Hand surgery Right     to take glass out  . Colonoscopy N/A 02/10/2013    Procedure: COLONOSCOPY;  Surgeon: Ladene Artist, MD;  Location: [facility] ENDOSCOPY;  Service: Endoscopy;  Laterality: N/A;  . Retinal detachment surgery Right   . Loop recorder implant   02/15/2014    MDT LINQ implanted by Dr Rayann Heman for cryptogenic stroke  . Tee without cardioversion N/A 02/15/2014    Procedure: TRANSESOPHAGEAL ECHOCARDIOGRAM (TEE);  Surgeon: Josue Hector, MD;  Location: [facility] ENDOSCOPY;  Service: Cardiovascular;  Laterality: N/A;  . Left heart catheterization with coronary/graft angiogram  07/01/2011    Procedure: LEFT HEART CATHETERIZATION WITH Beatrix Fetters;  Surgeon: Sanda Klein, MD;  Location: [facility] CATH LAB;  Service: Cardiovascular;;  . Percutaneous coronary stent intervention (pci-s) Right 07/01/2011    Procedure: PERCUTANEOUS CORONARY STENT INTERVENTION (PCI-S);  Surgeon: Sanda Klein, MD;  Location: [facility] CATH LAB;  Service: Cardiovascular;  Laterality: Right;    Filed Vitals:   08/10/14 0811  BP: 128/82    Visit Diagnosis:  Pain in joint, shoulder region, left  Generalized weakness  Impaired functional mobility and activity tolerance  Difficulty walking  Spastic hemiplegia, dominant side      Subjective Assessment - 08/10/14 0811    Patient is accompained by: Family member   Pertinent History see epic snapshot, pt with low orthostatic BP, pt with DVT in LLE and PEs - on Zarelto.     Patient Stated Goals walk again, play guitar and sing, wife hopes he gets more use of hand  and can do more of his dressing and bathing   Pain Score 2    Pain Location Back   Pain Orientation Posterior   Pain Descriptors / Indicators Aching   Pain Type Chronic pain   Pain Onset 1 to 4 weeks ago   Pain Frequency Intermittent   Aggravating Factors  laying down   Pain Relieving Factors  repositioning   Multiple Pain Sites No        Treatment. Pt transferred w/c to mat stand pivot with minguard with quad cane(improved performance today) sit to supine with min A and mod v.c. Supine on mat pt performed self ROM shoulder flexion followed by gentle P/ROM shoulder flexion, abduction elbow extension and finger flexion/ extension. Mod A for supine-sit  EOM. Pt with multiple somatic complaints during session. Check STG's next week.                        OT Short Term Goals - 08/02/14 1714    OT SHORT TERM GOAL #1   Title Pt and wife will be mod I with HEP - 08/16/2014   Status On-going   OT SHORT TERM GOAL #2   Title Pt will report no more then 3/10 pain with PROM of shoulder flexion to at least 90* to assist with self care.   Status On-going   OT SHORT TERM GOAL #3   Title Pt will require no more than moderate assist for LB dressing   Status On-going   OT SHORT TERM GOAL #4   Title Pt will require supervision for UB dressing, mod vc's   Status On-going   OT SHORT TERM GOAL #5   Title Pt will require no more than min a for toilet transfers   Status Achieved   OT SHORT TERM GOAL #6   Title Pt will require min a to hike pants with toileting   Status Achieved           OT Long Term Goals - 08/02/14 1714    OT LONG TERM GOAL #1   Title Pt and wife will be mod I with upgraded HEP PRN - 09/13/2014   Status On-going   OT LONG TERM GOAL #2   Title Pt will be min a with LB dressing   Status On-going   OT LONG TERM GOAL #3   Title Pt will be supervision UB bathing   Status On-going   OT LONG TERM GOAL #4   Title Pt will be mod a LB bathing   Status On-going   OT LONG TERM GOAL #5   Title Pt will be min a for transfer tub bench into shower   Status On-going   OT LONG TERM GOAL #6   Title Pt will require min a for simple hot meal prep at wheelchair level , standing at counter intermittently with close S   Status On-going   OT LONG TERM GOAL #7   Title Pt will recall 2 strategies for adapted bathing/dressing   Status On-going   OT LONG TERM GOAL #8   Title Pt will tolerate 20 minutes of functional activity without rest breaks   Status On-going               Plan - 08/10/14 1324    Clinical Impression Statement Pt is slowly towards goals. Pt is limited in treatment by somatic complaints and he has  decreased awareness.   Pt will benefit from skilled therapeutic intervention in order  to improve on the following deficits (Retired) Decreased activity tolerance;Decreased balance;Decreased endurance;Decreased cognition;Decreased knowledge of use of DME;Decreased mobility;Decreased range of motion;Decreased safety awareness;Decreased strength;Impaired UE functional use;Impaired tone;Pain;Impaired flexibility   Rehab Potential Good   Clinical Impairments Affecting Rehab Potential Pt will benefit from skilled OT to address the above deficits.   OT Frequency 2x / week   OT Duration 8 weeks   OT Treatment/Interventions Self-care/ADL training;Moist Heat;Electrical Stimulation;DME and/or AE instruction;Neuromuscular education;Therapeutic exercise;Functional Mobility Training;Manual Therapy;Passive range of motion;Splinting;Therapeutic activities;Balance training;Patient/family education;Cognitive remediation/compensation   Plan neuro re-ed progress HEP        Problem List Patient Active Problem List   Diagnosis Date Noted  . Dysphagia 07/26/2014  . Antiplatelet or antithrombotic long-term use 04/22/2014  . Chest pain 04/17/2014  . SOB (shortness of breath) 04/17/2014  . Bilateral pulmonary embolism 04/11/2014  . Left-sided neglect 03/09/2014  . Dysphagia, pharyngoesophageal phase 03/02/2014  . Arthralgia of left ankle 02/24/2014  . Hemiplegia affecting left dominant side 02/16/2014  . 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 Benign neoplasm of colon 02/10/2013  . Special screening for malignant neoplasms, colon 02/10/2013  . OSA on CPAP 01/06/2013  . S/P angioplasty with DES to CFX 07/01/11 07/02/2011  . Sleep apnea, "cant afford C-pap" 07/02/2011  . CAD, RCA PCI '09, 10/11, CABG X 4 5/12 06/30/2011  . Hypertension, B/P has been low this admission 06/30/2011  . Hyperlipidemia 06/30/2011  . Morbid obesity 06/30/2011  . NSTEMI, 06/29/11 06/30/2011  . Sternal  manubrial dissociation with nonunion 06/30/2011  . Ischemic cardiomyopathy, EF 35-40 2D May 2012 06/30/2011  . CHF, acute, mild 06/30/2011    Tyrone Pautsch 08/10/2014, 1:29 PM Theone Murdoch, OTR/L [phone] Phone: [phone] 1:29 PM 08/10/2014 [facility] [address] [place], [place], [address] Phone: [phone]   Fax:  [phone]

## 2014-08-12 ENCOUNTER — Ambulatory Visit (INDEPENDENT_AMBULATORY_CARE_PROVIDER_SITE_OTHER): Payer: 59 | Admitting: *Deleted

## 2014-08-12 DIAGNOSIS — I63411 Cerebral infarction due to embolism of right middle cerebral artery: Secondary | ICD-10-CM | POA: Diagnosis not present

## 2014-08-16 ENCOUNTER — Telehealth: Payer: Self-pay | Admitting: Cardiovascular Disease

## 2014-08-16 NOTE — Telephone Encounter (Signed)
Pt's wife called in stating that her husband is apart of a stroke study which requires him to wear a [employer] Camera .She would like to know if Dr. Claiborne Billings is out with this. Please call  Thanks

## 2014-08-16 NOTE — Telephone Encounter (Signed)
Pt is going to be part of a stroke study - he is going to be wearing a Go Pro Camera on Thursday, same day as his office visit. Wife called to make sure Dr. Claiborne Billings would not have an issue w/ this if it is recording during visit.  Informed her I am unsure, will defer to physician.

## 2014-08-17 ENCOUNTER — Ambulatory Visit: Payer: 59

## 2014-08-17 ENCOUNTER — Encounter: Payer: 59 | Admitting: Occupational Therapy

## 2014-08-17 ENCOUNTER — Ambulatory Visit: Payer: 59 | Admitting: Occupational Therapy

## 2014-08-17 ENCOUNTER — Ambulatory Visit: Payer: 59 | Attending: Neurology | Admitting: Physical Therapy

## 2014-08-17 VITALS — BP 99/68 | HR 91

## 2014-08-17 DIAGNOSIS — G8110 Spastic hemiplegia affecting unspecified side: Secondary | ICD-10-CM

## 2014-08-17 DIAGNOSIS — M25512 Pain in left shoulder: Secondary | ICD-10-CM | POA: Diagnosis present

## 2014-08-17 DIAGNOSIS — R262 Difficulty in walking, not elsewhere classified: Secondary | ICD-10-CM | POA: Diagnosis present

## 2014-08-17 DIAGNOSIS — Z7409 Other reduced mobility: Secondary | ICD-10-CM | POA: Diagnosis present

## 2014-08-17 DIAGNOSIS — R531 Weakness: Secondary | ICD-10-CM | POA: Diagnosis present

## 2014-08-17 DIAGNOSIS — R29898 Other symptoms and signs involving the musculoskeletal system: Secondary | ICD-10-CM | POA: Diagnosis present

## 2014-08-17 NOTE — Patient Instructions (Signed)
Table slides: Place both hands on a towel seated at the table, perform the following 1-2 x day:  Slide towel forward and back 20 reps,  Slide towel side to each side 10 reps  Make a circle on the tabletop 20 reps within comfortable range  Then hold left forearm turn palm up/ down 10-20 reps.

## 2014-08-17 NOTE — Therapy (Addendum)
[facility] [address] [place], [place], [address] Phone: [phone]   Fax:  [phone]  Occupational Therapy Treatment  Patient Details  Name: Gregory Wiley MRN: [id number] Date of Birth: 03-20-1950 Referring Provider:  Tamsen Roers, MD  Encounter Date: 08/17/2014      OT End of Session - 08/17/14 1026    Visit Number 8   Number of Visits 16   Date for OT Re-Evaluation 09/13/14   Authorization Type [employer]   OT Start Time [phone]   OT Stop Time 1015   OT Time Calculation (min) 38 min   Activity Tolerance Patient tolerated treatment well   Behavior During Therapy [facility] for tasks assessed/performed      Past Medical History  Diagnosis Date  . Coronary artery disease     stent, then CABG 07/20/10  . MI (myocardial infarction)     NSTEMI- last cath 06/2011-Stent to LCX-DES, last nuc 07/30/11 low risk  . Diabetes mellitus     hx of, diet controlled  . Hypertension   . Hyperlipidemia   . Obesity (BMI 30-39.9)   . OSA on CPAP     since July 2013  . H/O cardiomyopathy     ischemic, now last echo 07/30/11, EF 38%W  . Diverticulosis   . Internal hemorrhoids   . Tubular adenoma of colon   . Plantar fasciitis of left foot   . Stroke     Past Surgical History  Procedure Laterality Date  . Coronary artery bypass graft  07/20/2010     LIMA-LAD; VG-ACUTE MARG of RCA; Seq VG-distal RCA & then pda  . Coronary angioplasty with stent placement  07/01/2011    DES-Resolute to native LCX  . Coronary angioplasty with stent placement  12/01/2007    COMPLEX 5 LESION PCI INCLUDING CUTTING BALLOON AND 4 CYPHER DESs  . Hernia repair  2006  . Knee surgery Right   . Hand surgery Right     to take glass out  . Colonoscopy N/A 02/10/2013    Procedure: COLONOSCOPY;  Surgeon: Ladene Artist, MD;  Location: [facility] ENDOSCOPY;  Service: Endoscopy;  Laterality: N/A;  . Retinal detachment surgery Right   . Loop recorder implant   02/15/2014    MDT LINQ implanted by Dr Rayann Heman for cryptogenic stroke  . Tee without cardioversion N/A 02/15/2014    Procedure: TRANSESOPHAGEAL ECHOCARDIOGRAM (TEE);  Surgeon: Josue Hector, MD;  Location: [facility] ENDOSCOPY;  Service: Cardiovascular;  Laterality: N/A;  . Left heart catheterization with coronary/graft angiogram  07/01/2011    Procedure: LEFT HEART CATHETERIZATION WITH Beatrix Fetters;  Surgeon: Sanda Klein, MD;  Location: [facility] CATH LAB;  Service: Cardiovascular;;  . Percutaneous coronary stent intervention (pci-s) Right 07/01/2011    Procedure: PERCUTANEOUS CORONARY STENT INTERVENTION (PCI-S);  Surgeon: Sanda Klein, MD;  Location: [facility] CATH LAB;  Service: Cardiovascular;  Laterality: Right;    Filed Vitals:   08/17/14 0946  BP: 99/68  Pulse: 91    Visit Diagnosis:  Generalized weakness  Pain in joint, shoulder region, left  Impaired functional mobility and activity tolerance      Subjective Assessment - 08/17/14 0946    Subjective  Pt reports foot pain only today   Patient is accompained by: Family member   Pertinent History see epic snapshot, pt with low orthostatic BP, pt with DVT in LLE and PEs - on Zarelto.     Patient Stated Goals walk again, play guitar and sing, wife hopes he  gets more use of hand and can do more of his dressing and bathing   Currently in Pain? Yes   Pain Score 3    Pain Location Foot   Pain Orientation Right   Pain Descriptors / Indicators Aching   Pain Type Chronic pain   Pain Onset 1 to 4 weeks ago   Pain Frequency Intermittent   Aggravating Factors  malpositioning    Pain Relieving Factors repositioning   Multiple Pain Sites No          Treatment: Hot pack applied to left hand and shoulder ( x 10 mins) while therapist checked progress towards goals/ discussed progress with patient.  No adverse reactions,  mild pink coloration on hand that disapated after several mins. Neuro re-ed; Gentle P/ROM to digits, thumb and wrist  with gentle distraction/ mobilization at wrist. Pt was instructed in HEP for table slides and forearm supination/ pronation. He returned demonstration after v.c. / demonstration by therapist. Pt demonstrates decreased LUE pain and tolerated increased activity after application of heat.  Pt was a little dizzy at end of session, BP was monitored and it was a bit low. Pt reports that he feels this way usually after he takes his morning meds.                     OT Education - 08/17/14 1026    Education provided Yes   Education Details tableslides and forearm supination/ pronation   Person(s) Educated Patient   Methods Explanation;Demonstration;Verbal cues;Handout   Comprehension Verbalized understanding;Returned demonstration          OT Short Term Goals - 08/17/14 0955    OT SHORT TERM GOAL #1   Title Pt and wife will be mod I with HEP - 08/16/2014   Baseline achieved for beginning- 08/17/14   Status Achieved   OT SHORT TERM GOAL #2   Title Pt will report no more then 3/10 pain with PROM of shoulder flexion to at least 90* to assist with self care.   Status On-going   OT SHORT TERM GOAL #3   Title Pt will require no more than moderate assist for LB dressing   Baseline max A   Status On-going   OT SHORT TERM GOAL #4   Title Pt will require supervision for UB dressing, mod vc's   Status Achieved   OT SHORT TERM GOAL #5   Title Pt will require no more than min a for toilet transfers   Status Achieved   OT SHORT TERM GOAL #6   Title Pt will require min a to hike pants with toileting   Baseline per pt report   Status Achieved           OT Long Term Goals - 08/02/14 1714    OT LONG TERM GOAL #1   Title Pt and wife will be mod I with upgraded HEP PRN - 09/13/2014   Status On-going   OT LONG TERM GOAL #2   Title Pt will be min a with LB dressing   Status On-going   OT LONG TERM GOAL #3   Title Pt will be supervision UB bathing   Status On-going   OT LONG TERM  GOAL #4   Title Pt will be mod a LB bathing   Status On-going   OT LONG TERM GOAL #5   Title Pt will be min a for transfer tub bench into shower   Status On-going   OT LONG TERM  GOAL #6   Title Pt will require min a for simple hot meal prep at wheelchair level , standing at counter intermittently with close S   Status On-going   OT LONG TERM GOAL #7   Title Pt will recall 2 strategies for adapted bathing/dressing   Status On-going   OT LONG TERM GOAL #8   Title Pt will tolerate 20 minutes of functional activity without rest breaks   Status On-going               Plan - 08/17/14 0948    Clinical Impression Statement Pt is progressing towards goals. Therapist checked progress and pt has met 3/6 short term goals. Progress has been limited by pt's overall pain and decreased activity tolerance.   Pt will benefit from skilled therapeutic intervention in order to improve on the following deficits (Retired) Decreased activity tolerance;Decreased balance;Decreased endurance;Decreased cognition;Decreased knowledge of use of DME;Decreased mobility;Decreased range of motion;Decreased safety awareness;Decreased strength;Impaired UE functional use;Impaired tone;Pain;Impaired flexibility   Rehab Potential Fair   Clinical Impairments Affecting Rehab Potential Pt will benefit from skilled OT to address the above deficits.   OT Frequency 2x / week   OT Duration 8 weeks   OT Treatment/Interventions Self-care/ADL training;Moist Heat;Electrical Stimulation;DME and/or AE instruction;Neuromuscular education;Therapeutic exercise;Functional Mobility Training;Manual Therapy;Passive range of motion;Splinting;Therapeutic activities;Balance training;Patient/family education;Cognitive remediation/compensation   Plan ADLS, heat before shoulder hand ROM if possible   Consulted and Agree with Plan of Care Patient        Problem List Patient Active Problem List   Diagnosis Date Noted  . Dysphagia 07/26/2014   . Antiplatelet or antithrombotic long-term use 04/22/2014  . Chest pain 04/17/2014  . SOB (shortness of breath) 04/17/2014  . Bilateral pulmonary embolism 04/11/2014  . Left-sided neglect 03/09/2014  . Dysphagia, pharyngoesophageal phase 03/02/2014  . Arthralgia of left ankle 02/24/2014  . Hemiplegia affecting left dominant side 02/16/2014  . 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 Benign neoplasm of colon 02/10/2013  . Special screening for malignant neoplasms, colon 02/10/2013  . OSA on CPAP 01/06/2013  . S/P angioplasty with DES to CFX 07/01/11 07/02/2011  . Sleep apnea, "cant afford C-pap" 07/02/2011  . CAD, RCA PCI '09, 10/11, CABG X 4 5/12 06/30/2011  . Hypertension, B/P has been low this admission 06/30/2011  . Hyperlipidemia 06/30/2011  . Morbid obesity 06/30/2011  . NSTEMI, 06/29/11 06/30/2011  . 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 Ischemic cardiomyopathy, EF 35-40 2D May 2012 06/30/2011  . CHF, acute, mild 06/30/2011    Amee Boothe 08/18/2014, 11:57 AM Theone Murdoch, OTR/L [phone] Phone: [phone] 11:57 AM 08/18/2014 [facility] [address] [place], [place], [address] Phone: [phone]   Fax:  [phone]

## 2014-08-17 NOTE — Therapy (Signed)
[facility] [address] [place], [place], [address] Phone: [phone]   Fax:  [phone]  Physical Therapy Treatment  Patient Details  Name: Gregory Wiley MRN: [id number] Date of Birth: 1950/12/25 Referring Provider:  Tamsen Roers, MD  Encounter Date: 08/17/2014      PT End of Session - 08/17/14 1300    Visit Number 8   Number of Visits 17   Date for PT Re-Evaluation 09/17/14   PT Start Time 5465   PT Stop Time 1100   PT Time Calculation (min) 45 min   Equipment Utilized During Treatment Gait belt   Activity Tolerance Patient limited by pain;Other (comment)  pt limited by dizziness      Past Medical History  Diagnosis Date  . Coronary artery disease     stent, then CABG 07/20/10  . MI (myocardial infarction)     NSTEMI- last cath 06/2011-Stent to LCX-DES, last nuc 07/30/11 low risk  . Diabetes mellitus     hx of, diet controlled  . Hypertension   . Hyperlipidemia   . Obesity (BMI 30-39.9)   . OSA on CPAP     since July 2013  . H/O cardiomyopathy     ischemic, now last echo 07/30/11, EF 38%W  . Diverticulosis   . Internal hemorrhoids   . Tubular adenoma of colon   . Plantar fasciitis of left foot   . Stroke     Past Surgical History  Procedure Laterality Date  . Coronary artery bypass graft  07/20/2010     LIMA-LAD; VG-ACUTE MARG of RCA; Seq VG-distal RCA & then pda  . Coronary angioplasty with stent placement  07/01/2011    DES-Resolute to native LCX  . Coronary angioplasty with stent placement  12/01/2007    COMPLEX 5 LESION PCI INCLUDING CUTTING BALLOON AND 4 CYPHER DESs  . Hernia repair  2006  . Knee surgery Right   . Hand surgery Right     to take glass out  . Colonoscopy N/A 02/10/2013    Procedure: COLONOSCOPY;  Surgeon: Ladene Artist, MD;  Location: [facility] ENDOSCOPY;  Service: Endoscopy;  Laterality: N/A;  . Retinal detachment surgery Right   . Loop recorder implant  02/15/2014    MDT LINQ  implanted by Dr Rayann Heman for cryptogenic stroke  . Tee without cardioversion N/A 02/15/2014    Procedure: TRANSESOPHAGEAL ECHOCARDIOGRAM (TEE);  Surgeon: Josue Hector, MD;  Location: [facility] ENDOSCOPY;  Service: Cardiovascular;  Laterality: N/A;  . Left heart catheterization with coronary/graft angiogram  07/01/2011    Procedure: LEFT HEART CATHETERIZATION WITH Beatrix Fetters;  Surgeon: Sanda Klein, MD;  Location: [facility] CATH LAB;  Service: Cardiovascular;;  . Percutaneous coronary stent intervention (pci-s) Right 07/01/2011    Procedure: PERCUTANEOUS CORONARY STENT INTERVENTION (PCI-S);  Surgeon: Sanda Klein, MD;  Location: [facility] CATH LAB;  Service: Cardiovascular;  Laterality: Right;    There were no vitals filed for this visit.  Visit Diagnosis:  Difficulty walking  Left leg weakness  Generalized weakness  Impaired functional mobility and activity tolerance  Spastic hemiplegia, dominant side      Subjective Assessment - 08/17/14 1019    Subjective pt denies falls or changes since last visit. pt states he slept "funny" last night which is contributing to his R foot pain. pt states he had an almost fall when getting up from his lift chair but his wife caught him  before he fell and lowered him to the bed.  pt's currently blood  pressure is 103/76.   Patient Stated Goals walk better and longer distances   Currently in Pain? Yes   Pain Score 3    Pain Location Foot   Pain Orientation Right   Pain Descriptors / Indicators Aching   Pain Type Chronic pain   Pain Onset 1 to 4 weeks ago   Pain Frequency Intermittent   Aggravating Factors  malpositioning    Pain Relieving Factors repositioning             [facility] PT Assessment - 08/17/14 0001    Berg Balance Test   Sit to Stand Needs moderate or maximal assist to stand   Sitting with Back Unsupported but Feet Supported on Floor or Stool Able to sit 2 minutes under supervision  pt unable to maintain sitting due to dizziness being  10/10   Stand to Sit Controls descent by using hands        Treatment  Therapeutic Exercise: -L LE Seated heel slides using pillowcase under L heel to assist with motion. Tactile cues/assist given to distal hamstrings to facilitate movement. 10 reps.  -Seated L LAQs for LE strengthening. 10 reps with cues to not compensate with trunk extension.    Therapeutic Activity: -STS with cues to improve anterior weight shifting. Performed with CGA to ensure safety and help stabilize. 4 reps.   Neuromuscular Re-Education: -Seated visual habituation exercises: 20 horizontal and 20 vertical head turns focusing on letter on card. Pt instructed to take breaks when necessary to help avoid dizziness level rising too high.  -Seated visual saccade exercises with cues given for appropriate pace of eye movements. 10 reps.                    PT Short Term Goals - 08/17/14 1314    PT SHORT TERM GOAL #1   Title Pt will be independent in HEP to improve strength, balance, endurance, and safety. Target date: 08/16/14.   Status On-going   PT SHORT TERM GOAL #2   Title Perform BERG and write goals if appropriate. Target date: 08/16/14.  pt was unable to complete full Berg today due to nausea and dizziness symptoms    Status On-going   PT SHORT TERM GOAL #3   Title Pt will perform sit<>stand with UE assist at MOD I level to improve functional mobilty. Target date: 08/16/14.  pt partially met, requires CGA for transfers and multiple attempts   Status Partially Met   PT SHORT TERM GOAL #4   Title Pt will ambulate 200' over even terrain with LRAD and supervision to improve functional mobility. Target date: 08/16/14.   Status On-going   PT SHORT TERM GOAL #5   Title Pt will perform TUG in </=90 seconds to reduce falls risk. Target date: 08/16/14.   Status On-going           PT Long Term Goals - 07/27/14 2058    PT LONG TERM GOAL #1   Title Pt will verbalize understanding of CVA signs/symptoms  and risk factors to reduce risk of another CVA. Target date: 09/13/14.   Status On-going   PT LONG TERM GOAL #2   Title Pt will report no falls over the last four weeks to improve safety during functional mobility. Target date; 09/13/14.   Status On-going   PT LONG TERM GOAL #3   Title Pt will ambulate 400' over even/uneven terrain with LRAD at MOD I level to improve functional mobililty. Target date: 09/13/14.   Status  On-going   PT LONG TERM GOAL #4   Title Pt's SIS-mobility score will improve by 10% to improve quality of life. Target date: 09/13/14.   Status On-going   PT LONG TERM GOAL #5   Title Pt's gait speed will improve to >/=1.26f/sec. with LRAD to reduce falls risk. Target date: 09/13/14.   Status On-going               Plan - 08/17/14 1312    Clinical Impression Statement Today pt was significantly limited by dizziness symtpoms, with him self reporting a level 10/10 when performing STS.  Only STS STG was ableto be appropriately reassessed due to pt's nausea and dizziness. Verbally reviewed HEP with patient, and performed visual habituation exercises to address dizziness symptoms.    Pt will benefit from skilled therapeutic intervention in order to improve on the following deficits Difficulty walking;Decreased safety awareness;Decreased endurance;Obesity;Decreased knowledge of use of DME;Decreased balance;Decreased mobility;Decreased strength;Impaired UE functional use;Other (comment)   Rehab Potential Good   PT Frequency 2x / week   PT Duration 8 weeks   PT Treatment/Interventions ADLs/Self Care Home Management;Gait training;Neuromuscular re-education;Biofeedback;Functional mobility training;Patient/family education;Cryotherapy;Therapeutic activities;Wheelchair mobility training;Electrical Stimulation;Therapeutic exercise;Manual techniques;DME Instruction;Balance training;Other (comment)   PT Next Visit Plan check the STGs that were not able to be reassed today; gait training  with large quad cane, L AFO, and L heel wedge; LE and trunk strengthening   Consulted and Agree with Plan of Care Patient;Family member/caregiver        Problem List Patient Active Problem List   Diagnosis Date Noted  . Dysphagia 07/26/2014  . Antiplatelet or antithrombotic long-term use 04/22/2014  . Chest pain 04/17/2014  . SOB (shortness of breath) 04/17/2014  . Bilateral pulmonary embolism 04/11/2014  . Left-sided neglect 03/09/2014  . Dysphagia, pharyngoesophageal phase 03/02/2014  . Arthralgia of left ankle 02/24/2014  . Hemiplegia affecting left dominant side 02/16/2014  . 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 Benign neoplasm of colon 02/10/2013  . Special screening for malignant neoplasms, colon 02/10/2013  . OSA on CPAP 01/06/2013  . S/P angioplasty with DES to CFX 07/01/11 07/02/2011  . Sleep apnea, "cant afford C-pap" 07/02/2011  . CAD, RCA PCI '09, 10/11, CABG X 4 5/12 06/30/2011  . Hypertension, B/P has been low this admission 06/30/2011  . Hyperlipidemia 06/30/2011  . Morbid obesity 06/30/2011  . NSTEMI, 06/29/11 06/30/2011  . 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 Ischemic cardiomyopathy, EF 35-40 2D May 2012 06/30/2011  . CHF, acute, mild 06/30/2011    MFanny Dance6/03/2014, 1:24 PM  [place]Sc[facility] [place]SShawanoGOdenville [place] [address]Phone: [phone]  Fax:  [phone]  This entire session was performed under direct supervision and direction of a licensed therapist/therapist assistant . I have personally read, edited and approve of the note as written.  DNarda Bonds PDelawareCSwansea06/04/2014 8:12 AM Phone: [phone]: [phone]

## 2014-08-18 ENCOUNTER — Ambulatory Visit (INDEPENDENT_AMBULATORY_CARE_PROVIDER_SITE_OTHER): Payer: 59 | Admitting: Cardiovascular Disease

## 2014-08-18 ENCOUNTER — Encounter: Payer: Self-pay | Admitting: Cardiovascular Disease

## 2014-08-18 ENCOUNTER — Encounter: Payer: Self-pay | Admitting: Internal Medicine

## 2014-08-18 VITALS — BP 102/62 | HR 93 | Ht 72.0 in | Wt 257.0 lb

## 2014-08-18 DIAGNOSIS — I429 Cardiomyopathy, unspecified: Secondary | ICD-10-CM

## 2014-08-18 DIAGNOSIS — E785 Hyperlipidemia, unspecified: Secondary | ICD-10-CM

## 2014-08-18 DIAGNOSIS — I10 Essential (primary) hypertension: Secondary | ICD-10-CM

## 2014-08-18 DIAGNOSIS — Z9989 Dependence on other enabling machines and devices: Secondary | ICD-10-CM

## 2014-08-18 DIAGNOSIS — Z86711 Personal history of pulmonary embolism: Secondary | ICD-10-CM

## 2014-08-18 DIAGNOSIS — I2510 Atherosclerotic heart disease of native coronary artery without angina pectoris: Secondary | ICD-10-CM

## 2014-08-18 DIAGNOSIS — I2583 Coronary atherosclerosis due to lipid rich plaque: Secondary | ICD-10-CM

## 2014-08-18 DIAGNOSIS — G4733 Obstructive sleep apnea (adult) (pediatric): Secondary | ICD-10-CM

## 2014-08-18 DIAGNOSIS — I255 Ischemic cardiomyopathy: Secondary | ICD-10-CM

## 2014-08-18 NOTE — Progress Notes (Signed)
Patient ID: Gregory Wiley, male   DOB: 11-Mar-1951, 64 y.o.   MRN: [id number]     HPI: RENNE Wiley is a 64 y.o. male presents to the office today for follow-up cardiology evaluation. He is a former patient of Dr. Rex Kras.  Gregory Wiley has established CAD and underwent initial stenting of his RCA in May 2011. He is s/p CABG surgery with LIMA to the LAD, sequential vein to the distal RCA and PDA, and vein to the acute marginal branch of his right coronary artery. In April 2013 he developed unstable angina and ruled in for non-ST segment elevation myocardial infarction. Catheterization revealed 99% stenosis of the large left circumflex vessel and he underwent successful stenting with insertion of a 4.0x18 mm resolute DES stent post dilated to 4.5 mm.  Additional problems include obstructive sleep apnea. On his initial polysomnogram in 2010 AHI was 11.3 overall, but during REM sleep he had moderate sleep apnea with an AHI of 24.8. He never did initiate CPAP therapy at that time. However, since his myocardial infarction he has been using CPAP therapy since July 2013.   He has a history of hyperlipidemia, hypertension, and morbid obesity, but he has noticed some weight loss. An echo Doppler study in May 2013 suggested an ejection fraction of 38% and on nuclear imaging  was 37% with scar in the LAD territory.  He was admitted to [facility] hospital in November 2015 with a large right MCA territory CVA.  TEE was negative for embolic source.  EF was 45% on echocardiogram.  He underwent implantable loop recorder by Dr. Rayann Heman.  He was readmitted on general 25 3 gender 40 with bilateral submassive pulmonary embolism.  Venous Dopplers were positive for DVT.  Ejection fraction was 25-30%, which is felt to be contributed by strain from his pulmonary emboli.  He had mild troponin elevation which was felt to be due to demand ischemia and has been on Xarelto for anticoagulation.  When I recently saw him after not having seen  him for 18 months, he told me that he had noticed  some blood in his stool.  I recommended that he follow-up with Dr. Lucio Edward who he had seen 3 weeks ago.  He was felt to have small volume hematochezia and constipation with bleeding secondary to known internal hemorrhoids which were detected on colonoscopy previously.  He was advised to begin stool softener and MiraLAX.  With his history of GU, erosive gastroduodenitis, GERD, and esophageal stricture, PPI twice a day long-term an anti-reflex measures were recommended.  Easily.  He he has had some difficulty with erectile function.  He notes occasional upset stomach.  He complains of some mild nausea.  He denies chest tightness.  He has residual left-sided weakness from his stroke.  He is undergoing rehabilitation.  He presents for evaluation.  Past Medical History  Diagnosis Date  . Coronary artery disease     stent, then CABG 07/20/10  . MI (myocardial infarction)     NSTEMI- last cath 06/2011-Stent to LCX-DES, last nuc 07/30/11 low risk  . Diabetes mellitus     hx of, diet controlled  . Hypertension   . Hyperlipidemia   . Obesity (BMI 30-39.9)   . OSA on CPAP     since July 2013  . H/O cardiomyopathy     ischemic, now last echo 07/30/11, EF 38%W  . Diverticulosis   . Internal hemorrhoids   . Tubular adenoma of colon   . Plantar fasciitis of  left foot   . Stroke     Past Surgical History  Procedure Laterality Date  . Coronary artery bypass graft  07/20/2010     LIMA-LAD; VG-ACUTE MARG of RCA; Seq VG-distal RCA & then pda  . Coronary angioplasty with stent placement  07/01/2011    DES-Resolute to native LCX  . Coronary angioplasty with stent placement  12/01/2007    COMPLEX 5 LESION PCI INCLUDING CUTTING BALLOON AND 4 CYPHER DESs  . Hernia repair  2006  . Knee surgery Right   . Hand surgery Right     to take glass out  . Colonoscopy N/A 02/10/2013    Procedure: COLONOSCOPY;  Surgeon: Ladene Artist, MD;  Location: [facility] ENDOSCOPY;   Service: Endoscopy;  Laterality: N/A;  . Retinal detachment surgery Right   . Loop recorder implant  02/15/2014    MDT LINQ implanted by Dr Rayann Heman for cryptogenic stroke  . Tee without cardioversion N/A 02/15/2014    Procedure: TRANSESOPHAGEAL ECHOCARDIOGRAM (TEE);  Surgeon: Josue Hector, MD;  Location: [facility] ENDOSCOPY;  Service: Cardiovascular;  Laterality: N/A;  . Left heart catheterization with coronary/graft angiogram  07/01/2011    Procedure: LEFT HEART CATHETERIZATION WITH Beatrix Fetters;  Surgeon: Sanda Klein, MD;  Location: [facility] CATH LAB;  Service: Cardiovascular;;  . Percutaneous coronary stent intervention (pci-s) Right 07/01/2011    Procedure: PERCUTANEOUS CORONARY STENT INTERVENTION (PCI-S);  Surgeon: Sanda Klein, MD;  Location: [facility] CATH LAB;  Service: Cardiovascular;  Laterality: Right;    Allergies  Allergen Reactions  . Diphenhydramine Hcl Anaphylaxis    Current Outpatient Prescriptions  Medication Sig Dispense Refill  . acetaminophen (TYLENOL) 325 MG tablet Take 2 tablets (650 mg total) by mouth every 6 (six) hours as needed for mild pain.    Marland Kitchen ALPRAZolam (XANAX) 0.25 MG tablet Take 0.5 tablets (0.125 mg total) by mouth every 8 (eight) hours as needed for anxiety. 45 tablet 5  . aspirin 81 MG EC tablet Take 1 tablet (81 mg total) by mouth daily. 30 tablet 6  . carvedilol (COREG) 3.125 MG tablet Take 1 tablet (3.125 mg total) by mouth 2 (two) times daily with a meal. 180 tablet 2  . colchicine 0.6 MG tablet Take 0.6 mg by mouth as needed (for gout).   0  . feeding supplement (BOOST HIGH PROTEIN) LIQD Take 1 Container by mouth as directed.    . fluticasone (FLONASE) 50 MCG/ACT nasal spray Place 2 sprays into both nostrils daily.  2  . furosemide (LASIX) 40 MG tablet Take 40 mg by mouth as needed for edema.    . gabapentin (NEURONTIN) 100 MG capsule Take 1 capsule (100 mg total) by mouth 2 (two) times daily. Bed-time 60 capsule 11  . hydrocortisone (ANUSOL-HC) 25 MG  suppository Place 1 suppository (25 mg total) rectally daily. X 1 week; then, use as needed thereafter. 30 suppository 11  . lisinopril (PRINIVIL,ZESTRIL) 2.5 MG tablet Take 1 tablet (2.5 mg total) by mouth daily. 90 tablet 3  . meclizine (ANTIVERT) 25 MG tablet Take 1 tablet (25 mg total) by mouth 3 (three) times daily as needed for dizziness. (Patient taking differently: Take 25 mg by mouth 3 (three) times daily as needed for dizziness (bed-time). ) 30 tablet 0  . Melatonin 10 MG CAPS Take 1 capsule by mouth at bedtime.    . methylphenidate (RITALIN) 10 MG tablet Take 1 tablet (10 mg total) by mouth 2 (two) times daily. With breakfast and lunch 60 tablet 0  . nitroGLYCERIN (  NITROSTAT) 0.4 MG SL tablet Place 0.4 mg under the tongue every 5 (five) minutes as needed. For chest pain    . ondansetron (ZOFRAN ODT) 4 MG disintegrating tablet Take 1 tablet (4 mg total) by mouth every 8 (eight) hours as needed for nausea or vomiting. 15 tablet 0  . OVER THE COUNTER MEDICATION Inhale 1 application into the lungs at bedtime. C-PAP    . pantoprazole (PROTONIX) 40 MG tablet Take 1 tablet (40 mg total) by mouth 2 (two) times daily. 60 tablet 6  . ranolazine (RANEXA) 500 MG 12 hr tablet Take 1 tablet (500 mg total) by mouth 2 (two) times daily. 180 tablet 1  . rivaroxaban (XARELTO) 20 MG TABS tablet Take 1 tablet (20 mg total) by mouth daily with supper. 30 tablet 11  . rosuvastatin (CRESTOR) 40 MG tablet Take 1 tablet (40 mg total) by mouth daily. 30 tablet 0  . Sennosides (SENOKOT PO) Take 2 tablets by mouth daily.    Marland Kitchen tiZANidine (ZANAFLEX) 2 MG tablet Take 1 tablet by mouth daily.    . Vitamin D, Ergocalciferol, (DRISDOL) 50000 UNITS CAPS capsule Take 50,000 Units by mouth every 7 (seven) days. Take on Mondays     No current facility-administered medications for this visit.    History   Social History  . Marital Status: Married    Spouse Name: N/A  . Number of Children: 5  . Years of Education: N/A     Occupational History  .      works at the [employer] Topics  . Smoking status: Former Research scientist (life sciences)  . Smokeless tobacco: Never Used  . Alcohol Use: 0.0 oz/week    0 Standard drinks or equivalent per week     Comment: 1-2 times per year  . Drug Use: No  . Sexual Activity: Not on file   Other Topics Concern  . 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 Diabetes type II Mother   . Coronary artery disease    . Colon cancer Neg Hx   . CAD Father 76    CABG, PCI, died at 28  . Heart attack Father 61  . Hypertension Brother   . Diabetes Brother   . Hyperlipidemia Brother   . Brain cancer Maternal Grandmother   . COPD Paternal Grandfather   . Other      denies family history of DVT/PE  . Stroke Neg Hx    Additional social history is notable in that he is married has 5 children 4 grandchildren. He does try to walk. There is no tobacco or alcohol use.  ROS General: Negative; No fevers, chills, or night sweats;  HEENT: Negative; No changes in vision or hearing, sinus congestion, difficulty swallowing Pulmonary: Negative; No cough, wheezing, shortness of breath, hemoptysis Cardiovascular: see HPI GI: Negative; No nausea, vomiting, diarrhea, or abdominal pain GU: Negative; No dysuria, hematuria, or difficulty voiding Musculoskeletal: Negative; no myalgias, joint pain, or weakness Hematologic/Oncology: Negative; no easy bruising, bleeding Endocrine: Negative; no heat/cold intolerance; no diabetes Neuro: Right MCA stroke with residual left-sided weakness Skin: Negative; No rashes or skin lesions Psychiatric: Negative; No behavioral problems, depression Sleep: Positive for sleep apnea on CPAP; No snoring, daytime sleepiness, hypersomnolence, bruxism, restless legs, hypnogognic  hallucinations,  no cataplexy Other comprehensive 14 point system review is negative.  PE BP 102/62 mmHg  Pulse 93  Ht 6' (1.829 m)  Wt 257 lb (116.574 kg)  BMI 34.85 kg/m2  Wt Readings from Last 3 Encounters:  08/18/14 257 lb (116.574 kg)  07/26/14 266 lb 3.2 oz (120.748 kg)  06/17/14 280 lb 9.6 oz (127.279 kg)   General: Alert, oriented, no distress.  Skin: normal turgor, no rashes HEENT: Normocephalic, atraumatic. Pupils round and reactive; sclera anicteric;no lid lag.  Nose without nasal septal hypertrophy Mouth/Parynx benign; Mallinpatti scale 3/4 Neck: Thick neck; No JVD, no carotid briuts Lungs: clear to ausculatation and percussion; no wheezing or rales Heart: RRR, s1 s2 normal 1/6 sem; no diastolic murmur.  No rubs thrills or heaves. Abdomen: soft, nontender; no hepatosplenomehaly, BS+; abdominal aorta nontender and not dilated by palpation. Pulses 2+ Extremities: no clubbing cyanosis or edema, Homan's sign negative  Neurologic: Residual hemiparesis on the left. Psychologic: normal affect and mood.  ECG (independently read by me): Normal sinus rhythm at 93 bpm.  QS complex anteriorly compatible with old anterior MI  April 2016 ECG (independently read by me): Normal sinus rhythm at 79 bpm.  Old anteroseptal MI with QS complex V1 through V3.  LVH by voltage criteria in aVL.  Increased QTc interval 488 ms.  ECG: Normal sinus rhythm at 60. QS complex in V1 through V3 concordant with his documented LAD region scar. T-wave inversion in leads 1 and avL unchanged.   LABS:  BMET  BMP Latest Ref Rng 07/22/2014 05/03/2014 04/12/2014  Glucose 70 - 99 mg/dL 90 110(H) 106(H)  BUN 6 - 23 mg/dL $Remove'10 13 10  'fqUCMZD$ Creatinine 0.50 - 1.35 mg/dL 0.84 1.22 1.01  Sodium 135 - 145 mEq/L 138 137 141  Potassium 3.5 - 5.3 mEq/L 3.9 3.7 3.4(L)  Chloride 96 - 112 mEq/L 99 102 107  CO2 19 - 32 mEq/L $Remove'28 24 25  'HlBIfJo$ Calcium 8.4 - 10.5 mg/dL 8.9 9.4 8.9     Hepatic Function Latest Ref Rng 07/22/2014 05/03/2014 04/12/2014    Total Protein 6.0 - 8.3 g/dL 7.1 8.4(H) 6.8  Albumin 3.5 - 5.2 g/dL 3.7 4.0 3.2(L)  AST 0 - 37 U/L $Remo'13 24 27  'MiWxJ$ ALT 0 - 53 U/L 11 32 43  Alk Phosphatase 39 - 117 U/L 55 87 73  Total Bilirubin 0.2 - 1.2 mg/dL 0.6 1.0 0.7    CBC Latest Ref Rng 07/22/2014 05/03/2014 04/13/2014  WBC 4.0 - 10.5 K/uL 7.6 8.1 7.9  Hemoglobin 13.0 - 17.0 g/dL 13.8 15.4 13.2  Hematocrit 39.0 - 52.0 % 42.5 46.2 40.9  Platelets 150 - 400 K/uL 204 205 178   Lab Results  Component Value Date   MCV 83.3 07/22/2014   MCV 82.5 05/03/2014   MCV 83.0 04/13/2014   Lab Results  Component Value Date   TSH 0.828 07/22/2014   Lab Results  Component Value Date   HGBA1C 5.6 02/13/2014    BNP    Component Value Date/Time   PROBNP 1224.0* 06/30/2011 0202    Lipid Panel     Component Value Date/Time   CHOL 113 07/22/2014 1129   CHOL 199 01/04/2013 1206   TRIG 104 07/22/2014 1129   TRIG 113 01/04/2013 1206   HDL 38* 07/22/2014 1129   HDL 47 01/04/2013 1206   CHOLHDL 3.0 07/22/2014 1129   VLDL 21 07/22/2014 1129   LDLCALC 54 07/22/2014 1129   LDLCALC 129* 01/04/2013 1206     RADIOLOGY:  No results found.    ASSESSMENT AND PLAN: Gregory Wiley is a 64 year old gentleman with a history of morbid obesity, mild hypertension, known coronary artery disease who is status post initial stenting of his RCA in May 2011 with subsequent CABG surgery. In April 2013 he was found to have subtotal stenosis in a very large circumflex vessel and underwent successful insertion of a large Resolute DES stent. His last nuclear perfusion study in May 2013  revealed an ejection fraction approximately 37%.   He has suffered a large right MCA CVA.  He has implantable loop recorder to assess for potential atrial fibrillation.  He subsequently suffered bilateral submassive pulmonary emboli and has developed a cardiomyopathy with a drop in ejection fraction to 25-30% noted at that time.  When I last saw him, I recommended initiation of low-dose  lisinopril.  He has continued to take this as tolerated this.  In June, I am recommending he undergo a six-month follow-up echo Doppler study to see if there is been any improvement in LV function, particularly with resolution of RV strain.  I will also check a six-month follow-up chest CT to reassess his PE.  He will continue with rehabilitation.  He does have mild erectile dysfunction and is on very low-dose carvedilol 3.125 twice a day.  2.  Restart anticoagulation with Xarelto.  His most recent lipid studies in May were excellent with an LDL cholesterol at 54.  On his current dose of Crestor 40 mg daily.  I will see him in 3 months for cardiology evaluation and follow-up of the above studies.  Time spent: 25 minutes  [name], MD, [facility]  08/18/2014 6:34 PM

## 2014-08-18 NOTE — Patient Instructions (Signed)
Non-Cardiac CT scanning, (CAT scanning), is a noninvasive, special x-ray that produces cross-sectional images of the body using x-rays and a computer. CT scans help physicians diagnose and treat medical conditions. For some CT exams, a contrast material is used to enhance visibility in the area of the body being studied. CT scans provide greater clarity and reveal more details than regular x-ray exams. This will be done of your chest area to follow up on your past history of blood clots in your lungs. It will be scheduled at the end of June 2016.  Your physician has requested that you have an echocardiogram. Echocardiography is a painless test that uses sound waves to create images of your heart. It provides your doctor with information about the size and shape of your heart and how well your heart's chambers and valves are working. This procedure takes approximately one hour. There are no restrictions for this procedure.   Your physician recommends that you schedule a follow-up appointment in: 3 months with Dr. Claiborne Billings.

## 2014-08-19 ENCOUNTER — Ambulatory Visit: Payer: 59 | Admitting: Occupational Therapy

## 2014-08-19 ENCOUNTER — Ambulatory Visit: Payer: 59

## 2014-08-19 ENCOUNTER — Encounter: Payer: Self-pay | Admitting: Occupational Therapy

## 2014-08-19 VITALS — BP 110/74

## 2014-08-19 DIAGNOSIS — R262 Difficulty in walking, not elsewhere classified: Secondary | ICD-10-CM

## 2014-08-19 DIAGNOSIS — G8110 Spastic hemiplegia affecting unspecified side: Secondary | ICD-10-CM

## 2014-08-19 DIAGNOSIS — M25512 Pain in left shoulder: Secondary | ICD-10-CM

## 2014-08-19 NOTE — Therapy (Signed)
[facility] [address] [place], [place], [address] Phone: [phone]   Fax:  [phone]  Occupational Therapy Treatment  Patient Details  Name: Gregory Wiley MRN: [id number] Date of Birth: 01-18-1951 Referring Provider:  Tamsen Roers, MD  Encounter Date: 08/19/2014      OT End of Session - 08/19/14 1631    Visit Number 9   Number of Visits 16   Date for OT Re-Evaluation 09/13/14   Authorization Type [employer]   OT Start Time 0300   OT Stop Time 0350   OT Time Calculation (min) 50 min   Equipment Utilized During Treatment hemi glide   Activity Tolerance Patient tolerated treatment well   Behavior During Therapy --  Patient with limited ability to inhibit anger response - yelling out occasionally.  Seems to be an inflated response to situation.      Past Medical History  Diagnosis Date  . Coronary artery disease     stent, then CABG 07/20/10  . MI (myocardial infarction)     NSTEMI- last cath 06/2011-Stent to LCX-DES, last nuc 07/30/11 low risk  . Diabetes mellitus     hx of, diet controlled  . Hypertension   . Hyperlipidemia   . Obesity (BMI 30-39.9)   . OSA on CPAP     since July 2013  . H/O cardiomyopathy     ischemic, now last echo 07/30/11, EF 38%W  . Diverticulosis   . Internal hemorrhoids   . Tubular adenoma of colon   . Plantar fasciitis of left foot   . Stroke     Past Surgical History  Procedure Laterality Date  . Coronary artery bypass graft  07/20/2010     LIMA-LAD; VG-ACUTE MARG of RCA; Seq VG-distal RCA & then pda  . Coronary angioplasty with stent placement  07/01/2011    DES-Resolute to native LCX  . Coronary angioplasty with stent placement  12/01/2007    COMPLEX 5 LESION PCI INCLUDING CUTTING BALLOON AND 4 CYPHER DESs  . Hernia repair  2006  . Knee surgery Right   . Hand surgery Right     to take glass out  . Colonoscopy N/A 02/10/2013    Procedure: COLONOSCOPY;  Surgeon:  Ladene Artist, MD;  Location: [facility] ENDOSCOPY;  Service: Endoscopy;  Laterality: N/A;  . Retinal detachment surgery Right   . Loop recorder implant  02/15/2014    MDT LINQ implanted by Dr Rayann Heman for cryptogenic stroke  . Tee without cardioversion N/A 02/15/2014    Procedure: TRANSESOPHAGEAL ECHOCARDIOGRAM (TEE);  Surgeon: Josue Hector, MD;  Location: [facility] ENDOSCOPY;  Service: Cardiovascular;  Laterality: N/A;  . Left heart catheterization with coronary/graft angiogram  07/01/2011    Procedure: LEFT HEART CATHETERIZATION WITH Beatrix Fetters;  Surgeon: Sanda Klein, MD;  Location: [facility] CATH LAB;  Service: Cardiovascular;;  . Percutaneous coronary stent intervention (pci-s) Right 07/01/2011    Procedure: PERCUTANEOUS CORONARY STENT INTERVENTION (PCI-S);  Surgeon: Sanda Klein, MD;  Location: [facility] CATH LAB;  Service: Cardiovascular;  Laterality: Right;    Filed Vitals:   08/19/14 1623 08/19/14 1633  BP: 69/46 110/74    Visit Diagnosis:  Pain in joint, shoulder region, left  Spastic hemiplegia, dominant side      Subjective Assessment - 08/19/14 1623    Subjective  My gout pain is really bad today.     Patient is accompained by: Family member  wife   Pertinent History see epic snapshot, pt with low orthostatic  BP, pt with DVT in LLE and PEs - on Zarelto.     Patient Stated Goals walk again, play guitar and sing, wife hopes he gets more use of hand and can do more of his dressing and bathing   Currently in Pain? Yes   Pain Score 10-Worst pain ever   Pain Location Ankle   Pain Orientation Left   Pain Descriptors / Indicators Sharp   Pain Type Acute pain   Pain Onset Yesterday   Pain Frequency Intermittent   Aggravating Factors  weight bearing   Pain Relieving Factors rest                      OT Treatments/Exercises (OP) - 08/19/14 0001    Neurological Re-education Exercises   Other Exercises 1 Neuromuscular reeducation to address postural control.  Patient  with strong bias posterior and to right side in sitting, although able to correct with cueing.  Worked with patient for more sustained muscle activity in trunk as precursor to left arm movement.  Patient with occasional report of shoulder discomfort, and with tendency to pull his body to right with attempts to move left arm actively or passively.  Patient responded very well to cue to tip left ear toward left shoulder to aleviate shoulder pain.     Other Exercises 2 Worked on isolating individual movements at shoulder, elbow, forearm, wrist and digits after very light weight bearing through heel of left hand.  Worked on sliding versus lifting his left hand for pre-reaching activity.                OT Education - 08/19/14 1630    Education provided Yes   Education Details Tip left ear toward left shoulder when experiencing discomfort in left shoulder   Person(s) Educated Patient;Spouse   Methods Explanation;Demonstration;Verbal cues   Comprehension Verbalized understanding;Verbal cues required          OT Short Term Goals - 08/17/14 0955    OT SHORT TERM GOAL #1   Title Pt and wife will be mod I with HEP - 08/16/2014   Baseline achieved for beginning- 08/17/14   Status Achieved   OT SHORT TERM GOAL #2   Title Pt will report no more then 3/10 pain with PROM of shoulder flexion to at least 90* to assist with self care.   Status On-going   OT SHORT TERM GOAL #3   Title Pt will require no more than moderate assist for LB dressing   Baseline max A   Status On-going   OT SHORT TERM GOAL #4   Title Pt will require supervision for UB dressing, mod vc's   Status Achieved   OT SHORT TERM GOAL #5   Title Pt will require no more than min a for toilet transfers   Status Achieved   OT SHORT TERM GOAL #6   Title Pt will require min a to hike pants with toileting   Baseline per pt report   Status Achieved           OT Long Term Goals - 08/02/14 1714    OT LONG TERM GOAL #1   Title  Pt and wife will be mod I with upgraded HEP PRN - 09/13/2014   Status On-going   OT LONG TERM GOAL #2   Title Pt will be min a with LB dressing   Status On-going   OT LONG TERM GOAL #3   Title Pt will be supervision UB  bathing   Status On-going   OT LONG TERM GOAL #4   Title Pt will be mod a LB bathing   Status On-going   OT LONG TERM GOAL #5   Title Pt will be min a for transfer tub bench into shower   Status On-going   OT LONG TERM GOAL #6   Title Pt will require min a for simple hot meal prep at wheelchair level , standing at counter intermittently with close S   Status On-going   OT LONG TERM GOAL #7   Title Pt will recall 2 strategies for adapted bathing/dressing   Status On-going   OT LONG TERM GOAL #8   Title Pt will tolerate 20 minutes of functional activity without rest breaks   Status On-going               Plan - 08/19/14 1633    Clinical Impression Statement Patient is showing slow progress, and has had limited participation with several pain complaints.  Patient can benefit from further OT as indicated to reduce pain, improve functional mobility, and reduce dependence on caregiver.   Pt will benefit from skilled therapeutic intervention in order to improve on the following deficits (Retired) Decreased activity tolerance;Decreased balance;Decreased endurance;Decreased cognition;Decreased knowledge of use of DME;Decreased mobility;Decreased range of motion;Decreased safety awareness;Decreased strength;Impaired UE functional use;Impaired tone;Pain;Impaired flexibility   Rehab Potential Fair   Clinical Impairments Affecting Rehab Potential Pt will benefit from skilled OT to address the above deficits.   OT Frequency 2x / week   OT Duration 8 weeks   OT Treatment/Interventions Self-care/ADL training;Moist Heat;Electrical Stimulation;DME and/or AE instruction;Neuromuscular education;Therapeutic exercise;Functional Mobility Training;Manual Therapy;Passive range of  motion;Splinting;Therapeutic activities;Balance training;Patient/family education;Cognitive remediation/compensation   Consulted and Agree with Plan of Care Patient   Family Member Consulted wife        Problem List Patient Active Problem List   Diagnosis Date Noted  . Dysphagia 07/26/2014  . Antiplatelet or antithrombotic long-term use 04/22/2014  . Chest pain 04/17/2014  . SOB (shortness of breath) 04/17/2014  . Bilateral pulmonary embolism 04/11/2014  . Left-sided neglect 03/09/2014  . Dysphagia, pharyngoesophageal phase 03/02/2014  . Arthralgia of left ankle 02/24/2014  . Hemiplegia affecting left dominant side 02/16/2014  . 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 Benign neoplasm of colon 02/10/2013  . Special screening for malignant neoplasms, colon 02/10/2013  . OSA on CPAP 01/06/2013  . S/P angioplasty with DES to CFX 07/01/11 07/02/2011  . Sleep apnea, "cant afford C-pap" 07/02/2011  . CAD, RCA PCI '09, 10/11, CABG X 4 5/12 06/30/2011  . Hypertension, B/P has been low this admission 06/30/2011  . Hyperlipidemia 06/30/2011  . Morbid obesity 06/30/2011  . NSTEMI, 06/29/11 06/30/2011  . 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 Ischemic cardiomyopathy, EF 35-40 2D May 2012 06/30/2011  . CHF, acute, mild 06/30/2011    Mariah Milling, OTR/L 08/19/2014, 4:37 PM  [facility] [address] [place], [place], [address] Phone: [phone]   Fax:  [phone]

## 2014-08-19 NOTE — Therapy (Signed)
[facility] [address] [place], [place], [address] Phone: [phone]   Fax:  [phone]  Physical Therapy Treatment  Patient Details  Name: Gregory Wiley MRN: [id number] Date of Birth: February 22, 1951 Referring Provider:  Tamsen Roers, MD  Encounter Date: 08/19/2014   NO CHARGE FOR VISIT, AS PT REQUESTED NO PT DUE TO 10/10 L FOOT PAIN DUE TO GOUT.      PT End of Session - 08/19/14 1504    Visit Number 8  no charge for visit   Number of Visits 17   Date for PT Re-Evaluation 09/17/14   Authorization Type [employer], but pt reported he should get Medicare next week. PT assigned G-codes in case he qualifies for Medicare.   PT Start Time 1446  no charge for visit   PT Stop Time 1452   PT Time Calculation (min) 6 min      Past Medical History  Diagnosis Date  . Coronary artery disease     stent, then CABG 07/20/10  . MI (myocardial infarction)     NSTEMI- last cath 06/2011-Stent to LCX-DES, last nuc 07/30/11 low risk  . Diabetes mellitus     hx of, diet controlled  . Hypertension   . Hyperlipidemia   . Obesity (BMI 30-39.9)   . OSA on CPAP     since July 2013  . H/O cardiomyopathy     ischemic, now last echo 07/30/11, EF 38%W  . Diverticulosis   . Internal hemorrhoids   . Tubular adenoma of colon   . Plantar fasciitis of left foot   . Stroke     Past Surgical History  Procedure Laterality Date  . Coronary artery bypass graft  07/20/2010     LIMA-LAD; VG-ACUTE MARG of RCA; Seq VG-distal RCA & then pda  . Coronary angioplasty with stent placement  07/01/2011    DES-Resolute to native LCX  . Coronary angioplasty with stent placement  12/01/2007    COMPLEX 5 LESION PCI INCLUDING CUTTING BALLOON AND 4 CYPHER DESs  . Hernia repair  2006  . Knee surgery Right   . Hand surgery Right     to take glass out  . Colonoscopy N/A 02/10/2013    Procedure: COLONOSCOPY;  Surgeon: Ladene Artist, MD;  Location: [facility] ENDOSCOPY;   Service: Endoscopy;  Laterality: N/A;  . Retinal detachment surgery Right   . Loop recorder implant  02/15/2014    MDT LINQ implanted by Dr Rayann Heman for cryptogenic stroke  . Tee without cardioversion N/A 02/15/2014    Procedure: TRANSESOPHAGEAL ECHOCARDIOGRAM (TEE);  Surgeon: Josue Hector, MD;  Location: [facility] ENDOSCOPY;  Service: Cardiovascular;  Laterality: N/A;  . Left heart catheterization with coronary/graft angiogram  07/01/2011    Procedure: LEFT HEART CATHETERIZATION WITH Beatrix Fetters;  Surgeon: Sanda Klein, MD;  Location: [facility] CATH LAB;  Service: Cardiovascular;;  . Percutaneous coronary stent intervention (pci-s) Right 07/01/2011    Procedure: PERCUTANEOUS CORONARY STENT INTERVENTION (PCI-S);  Surgeon: Sanda Klein, MD;  Location: [facility] CATH LAB;  Service: Cardiovascular;  Laterality: Right;    There were no vitals filed for this visit.  Visit Diagnosis:  Difficulty walking      Subjective Assessment - 08/19/14 1453    Subjective Pt reported he fell two days ago when reaching outside BOS, and his wife had to help him up. Pt reported gout flare up today and 10/10 with standing, and requested no PT today, as he is unable to stand on foot.  Patient is accompained by: Family member   Pertinent History MI, CABG, DM, CA   Patient Stated Goals walk better and longer distances   Currently in Pain? Yes   Pain Score --  1/10 at rest and 10/10 standing   Pain Location Ankle   Pain Orientation Left   Pain Descriptors / Indicators Sharp  sharp with standing   Pain Type Acute pain   Pain Onset Yesterday   Aggravating Factors  standing   Pain Relieving Factors rest       Pt stated he would not be able to participate in PT today, due to 10/10 L foot pain due to gout flare-up. Pt stated it takes about 24 hours for medication to decrease pain, and he took it this morning.  Pt not charged for visit. Pt willing to try OT today.                             PT Short Term Goals - 08/17/14 1314    PT SHORT TERM GOAL #1   Title Pt will be independent in HEP to improve strength, balance, endurance, and safety. Target date: 08/16/14.   Status On-going   PT SHORT TERM GOAL #2   Title Perform BERG and write goals if appropriate. Target date: 08/16/14.  pt was unable to complete full Berg today due to nausea and dizziness symptoms    Status On-going   PT SHORT TERM GOAL #3   Title Pt will perform sit<>stand with UE assist at MOD I level to improve functional mobilty. Target date: 08/16/14.  pt partially met, requires CGA for transfers and multiple attempts   Status Partially Met   PT SHORT TERM GOAL #4   Title Pt will ambulate 200' over even terrain with LRAD and supervision to improve functional mobility. Target date: 08/16/14.   Status On-going   PT SHORT TERM GOAL #5   Title Pt will perform TUG in </=90 seconds to reduce falls risk. Target date: 08/16/14.   Status On-going           PT Long Term Goals - 07/27/14 2058    PT LONG TERM GOAL #1   Title Pt will verbalize understanding of CVA signs/symptoms and risk factors to reduce risk of another CVA. Target date: 09/13/14.   Status On-going   PT LONG TERM GOAL #2   Title Pt will report no falls over the last four weeks to improve safety during functional mobility. Target date; 09/13/14.   Status On-going   PT LONG TERM GOAL #3   Title Pt will ambulate 400' over even/uneven terrain with LRAD at MOD I level to improve functional mobililty. Target date: 09/13/14.   Status On-going   PT LONG TERM GOAL #4   Title Pt's SIS-mobility score will improve by 10% to improve quality of life. Target date: 09/13/14.   Status On-going   PT LONG TERM GOAL #5   Title Pt's gait speed will improve to >/=1.38f/sec. with LRAD to reduce falls risk. Target date: 09/13/14.   Status On-going               Plan - 08/19/14 1507    Clinical Impression Statement No charge today.   Pt will benefit from skilled  therapeutic intervention in order to improve on the following deficits Difficulty walking;Decreased safety awareness;Decreased endurance;Obesity;Decreased knowledge of use of DME;Decreased balance;Decreased mobility;Decreased strength;Impaired UE functional use;Other (comment)   Rehab Potential Good  PT Frequency 2x / week   PT Duration 8 weeks   PT Treatment/Interventions ADLs/Self Care Home Management;Gait training;Neuromuscular re-education;Biofeedback;Functional mobility training;Patient/family education;Cryotherapy;Therapeutic activities;Wheelchair mobility training;Electrical Stimulation;Therapeutic exercise;Manual techniques;DME Instruction;Balance training;Other (comment)   PT Next Visit Plan check the STGs that were not able to be reassed today; gait training with large quad cane, L AFO, and L heel wedge; LE and trunk strengthening   Consulted and Agree with Plan of Care Patient;Family member/caregiver   Family Member Consulted wife-Laura        Problem List Patient Active Problem List   Diagnosis Date Noted  . Dysphagia 07/26/2014  . Antiplatelet or antithrombotic long-term use 04/22/2014  . Chest pain 04/17/2014  . SOB (shortness of breath) 04/17/2014  . Bilateral pulmonary embolism 04/11/2014  . Left-sided neglect 03/09/2014  . Dysphagia, pharyngoesophageal phase 03/02/2014  . Arthralgia of left ankle 02/24/2014  . Hemiplegia affecting left dominant side 02/16/2014  . 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 Benign neoplasm of colon 02/10/2013  . Special screening for malignant neoplasms, colon 02/10/2013  . OSA on CPAP 01/06/2013  . S/P angioplasty with DES to CFX 07/01/11 07/02/2011  . Sleep apnea, "cant afford C-pap" 07/02/2011  . CAD, RCA PCI '09, 10/11, CABG X 4 5/12 06/30/2011  . Hypertension, B/P has been low this admission 06/30/2011  . Hyperlipidemia 06/30/2011  . Morbid obesity 06/30/2011  . NSTEMI, 06/29/11 06/30/2011  . Sternal  manubrial dissociation with nonunion 06/30/2011  . Ischemic cardiomyopathy, EF 35-40 2D May 2012 06/30/2011  . CHF, acute, mild 06/30/2011    Raysean Graumann L 08/19/2014, 3:07 PM  [facility] [address] [place], [place], [address] Phone: [phone]   Fax:  [phone]     Geoffry Paradise, PT,DPT 08/19/2014 3:07 PM Phone: [phone] Fax: [phone]

## 2014-08-19 NOTE — Telephone Encounter (Signed)
Patient had appointment 08/18/14 with Dr. Claiborne Billings. Address at that time.

## 2014-08-20 NOTE — Telephone Encounter (Signed)
?   Purpose of camera

## 2014-08-23 ENCOUNTER — Ambulatory Visit: Payer: 59 | Admitting: Occupational Therapy

## 2014-08-23 ENCOUNTER — Encounter: Payer: Self-pay | Admitting: Occupational Therapy

## 2014-08-23 ENCOUNTER — Ambulatory Visit: Payer: 59 | Admitting: Physical Therapy

## 2014-08-23 VITALS — BP 101/72 | HR 96

## 2014-08-23 DIAGNOSIS — Z7409 Other reduced mobility: Secondary | ICD-10-CM

## 2014-08-23 DIAGNOSIS — G8110 Spastic hemiplegia affecting unspecified side: Secondary | ICD-10-CM

## 2014-08-23 DIAGNOSIS — R262 Difficulty in walking, not elsewhere classified: Secondary | ICD-10-CM | POA: Diagnosis not present

## 2014-08-23 DIAGNOSIS — R531 Weakness: Secondary | ICD-10-CM

## 2014-08-23 LAB — CUP PACEART REMOTE DEVICE CHECK
Date Time Interrogation Session: 20160604113102
Zone Setting Detection Interval: 2000 ms
Zone Setting Detection Interval: 3000 ms
Zone Setting Detection Interval: 360 ms

## 2014-08-23 NOTE — Patient Instructions (Signed)
Bathing Strategies: 1. Upper body bathing:  Use long handled sponge to wash back;  Place washcloth on right thigh and move right arm over washcloth to wash right arm.  Bend forward to get as much of the arm as possible.Use right arm to wash under right arm pit and right shoulder. Lean forward to get left arm away from body so you can wash left arm with more ease.  2.  Lower body bathing:  Use long handled sponge to wash feet.  Hook right foot under left foot to get it up off the floor.  To wash bottom, install grab bar (suction cup one) and non skip strips on bottom of tub.  Make sure your wife is with you when you stand.  Make sure your feet are apart prior to standing.  ONCE YOU HAVE YOUR BALANCE, carefully reach around and wash your bottom.  You can use the long handled sponge to wash your left cheek if needed.  Dressing Strategies: 1.  You are able to dress your upper body. 2.  For your pants and underwear, hook your right foot under your left to get it up off the floor.  Use the reacher like we practiced to get pants started on your left foot. Once the pants are started use your hand to pull pants all the way onto your left leg. Then dress your right leg (you can drop the pants back down and just step in with your right foot then pull pants up).  You are able to pull your pants up with your wife standing by.  Equipment to purchase: 1. Reacher 2. Suction grab bar - remember this is only to steady.  Do not put your full weight on this grab bar to pull up. 3. Strips for bottom of bathtub  You already have the long handled sponge - bring this in and we will bend it for you to make it easier to use.

## 2014-08-23 NOTE — Progress Notes (Signed)
Loop recorder 

## 2014-08-23 NOTE — Therapy (Signed)
[facility] [address] [place], [place], [address] Phone: [phone]   Fax:  [phone]  Physical Therapy Treatment  Patient Details  Name: Gregory Wiley MRN: [id number] Date of Birth: 1951-02-06 Referring Provider:  Tamsen Roers, MD  Encounter Date: 08/23/2014      PT End of Session - 08/23/14 1527    Visit Number 9   Number of Visits 17   Date for PT Re-Evaluation 09/17/14   Authorization Type U[employer], but pt reported he should get Medicare next week. PT assigned G-codes in case he qualifies for Medicare.   PT Start Time 1147   PT Stop Time 1232   PT Time Calculation (min) 45 min   Equipment Utilized During Treatment Gait belt   Activity Tolerance Patient limited by pain;Other (comment)  Pt with multiple c/o-nausea, headache, weakness   Behavior During Therapy Flat affect  Limited participation      Past Medical History  Diagnosis Date  . Coronary artery disease     stent, then CABG 07/20/10  . MI (myocardial infarction)     NSTEMI- last cath 06/2011-Stent to LCX-DES, last nuc 07/30/11 low risk  . Diabetes mellitus     hx of, diet controlled  . Hypertension   . Hyperlipidemia   . Obesity (BMI 30-39.9)   . OSA on CPAP     since July 2013  . H/O cardiomyopathy     ischemic, now last echo 07/30/11, EF 38%W  . Diverticulosis   . Internal hemorrhoids   . Tubular adenoma of colon   . Plantar fasciitis of left foot   . Stroke     Past Surgical History  Procedure Laterality Date  . Coronary artery bypass graft  07/20/2010     LIMA-LAD; VG-ACUTE MARG of RCA; Seq VG-distal RCA & then pda  . Coronary angioplasty with stent placement  07/01/2011    DES-Resolute to native LCX  . Coronary angioplasty with stent placement  12/01/2007    COMPLEX 5 LESION PCI INCLUDING CUTTING BALLOON AND 4 CYPHER DESs  . Hernia repair  2006  . Knee surgery Right   . Hand surgery Right     to take glass out  . Colonoscopy N/A  02/10/2013    Procedure: COLONOSCOPY;  Surgeon: Ladene Artist, MD;  Location: [facility] ENDOS[facility];  Service: Endoscopy;  Laterality: N/A;  . Retinal detachment surgery Right   . Loop recorder implant  02/15/2014    MDT LINQ implanted by Dr Rayann Heman for cryptogenic stroke  . Tee without cardioversion N/A 02/15/2014    Procedure: TRANSESOPHAGEAL ECHOCARDIOGRAM (TEE);  Surgeon: Josue Hector, MD;  Location: [facility] ENDOSCOPY;  Service: Cardiovascular;  Laterality: N/A;  . Left heart catheterization with coronary/graft angiogram  07/01/2011    Procedure: LEFT HEART CATHETERIZATION WITH Beatrix Fetters;  Surgeon: Sanda Klein, MD;  Location: [facility] CATH LAB;  Service: Cardiovascular;;  . Percutaneous coronary stent intervention (pci-s) Right 07/01/2011    Procedure: PERCUTANEOUS CORONARY STENT INTERVENTION (PCI-S);  Surgeon: Sanda Klein, MD;  Location: [facility] CATH LAB;  Service: Cardiovascular;  Laterality: Right;    Filed Vitals:   08/23/14 1203  BP: 101/72  Pulse: 96    Visit Diagnosis:  Spastic hemiplegia, dominant side      Subjective Assessment - 08/23/14 1152    Subjective Denies falls or changes.  Slept alot yesterday.   Pertinent History MI, CABG, DM, CA   Patient Stated Goals walk better and longer distances   Currently in Pain? Other (  Comment)  See OT note                         [facility] Adult PT Treatment/Exercise - 08/23/14 1200    Berg Balance Test   Sit to Stand Able to stand  independently using hands   Standing Unsupported Able to stand 2 minutes with supervision   Sitting with Back Unsupported but Feet Supported on Floor or Stool Able to sit safely and securely 2 minutes   Stand to Sit Controls descent by using hands   Transfers Able to transfer with verbal cueing and /or supervision   Standing Unsupported with Eyes Closed Able to stand 10 seconds safely   Standing Ubsupported with Feet Together Needs help to attain position but able to stand for 30 seconds  with feet together   From Standing, Reach Forward with Outstretched Arm Can reach forward >12 cm safely (5")   From Standing Position, Pick up Object from Floor Able to pick up shoe, needs supervision   From Standing Position, Turn to Look Behind Over each Shoulder Turn sideways only but maintains balance   Turn 360 Degrees Needs assistance while turning   Standing Unsupported, Alternately Place Feet on Step/Stool Needs assistance to keep from falling or unable to try   Standing Unsupported, One Foot in [employer] balance while stepping or standing   Standing on One Leg Unable to try or needs assist to prevent fall   Total Score 28   Timed Up and Go Test   TUG Normal TUG   Normal TUG (seconds) 60.87  needed CGA                  PT Short Term Goals - 08/23/14 1522    PT SHORT TERM GOAL #1   Title Pt will be independent in HEP to improve strength, balance, endurance, and safety. Target date: 08/16/14.   Status Not Met   PT SHORT TERM GOAL #2   Title Perform BERG and write goals if appropriate. Target date: 08/16/14.  pt was unable to complete full Berg today due to nausea and dizziness symptoms    Status Not Met   PT SHORT TERM GOAL #3   Title Pt will perform sit<>stand with UE assist at MOD I level to improve functional mobilty. Target date: 08/16/14.  pt partially met, requires CGA for transfers and multiple attempts   Status Not Met   PT SHORT TERM GOAL #4   Title Pt will ambulate 200' over even terrain with LRAD and supervision to improve functional mobility. Target date: 08/16/14.   Status Not Met   PT SHORT TERM GOAL #5   Title Pt will perform TUG in </=90 seconds to reduce falls risk. Target date: 08/16/14.   Status Achieved           PT Long Term Goals - 07/27/14 2058    PT LONG TERM GOAL #1   Title Pt will verbalize understanding of CVA signs/symptoms and risk factors to reduce risk of another CVA. Target date: 09/13/14.   Status On-going   PT LONG TERM GOAL #2    Title Pt will report no falls over the last four weeks to improve safety during functional mobility. Target date; 09/13/14.   Status On-going   PT LONG TERM GOAL #3   Title Pt will ambulate 400' over even/uneven terrain with LRAD at MOD I level to improve functional mobililty. Target date: 09/13/14.   Status On-going  PT LONG TERM GOAL #4   Title Pt's SIS-mobility score will improve by 10% to improve quality of life. Target date: 09/13/14.   Status On-going   PT LONG TERM GOAL #5   Title Pt's gait speed will improve to >/=1.50f/sec. with LRAD to reduce falls risk. Target date: 09/13/14.   Status On-going               Plan - 08/23/14 1528    Clinical Impression Statement Pt did not meet STG 1,2,3,4.  STG 5 met.  Pt with limited participation during treatments due to multiple c/o.  Flat affect.  Continue PT per POC.   Pt will benefit from skilled therapeutic intervention in order to improve on the following deficits Difficulty walking;Decreased safety awareness;Decreased endurance;Obesity;Decreased knowledge of use of DME;Decreased balance;Decreased mobility;Decreased strength;Impaired UE functional use;Other (comment)   PT Frequency 2x / week   PT Duration 8 weeks   PT Treatment/Interventions ADLs/Self Care Home Management;Gait training;Neuromuscular re-education;Biofeedback;Functional mobility training;Patient/family education;Cryotherapy;Therapeutic activities;Wheelchair mobility training;Electrical Stimulation;Therapeutic exercise;Manual techniques;DME Instruction;Balance training;Other (comment)   PT Next Visit Plan Set BERG goal as this was a previous STG?  Gait training, balance.   Consulted and Agree with Plan of Care Patient        Problem List Patient Active Problem List   Diagnosis Date Noted  . Dysphagia 07/26/2014  . Antiplatelet or antithrombotic long-term use 04/22/2014  . Chest pain 04/17/2014  . SOB (shortness of breath) 04/17/2014  . Bilateral pulmonary  embolism 04/11/2014  . Left-sided neglect 03/09/2014  . Dysphagia, pharyngoesophageal phase 03/02/2014  . Arthralgia of left ankle 02/24/2014  . Hemiplegia affecting left dominant side 02/16/2014  . 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 Benign neoplasm of colon 02/10/2013  . Special screening for malignant neoplasms, colon 02/10/2013  . OSA on CPAP 01/06/2013  . S/P angioplasty with DES to CFX 07/01/11 07/02/2011  . Sleep apnea, "cant afford C-pap" 07/02/2011  . CAD, RCA PCI '09, 10/11, CABG X 4 5/12 06/30/2011  . Hypertension, B/P has been low this admission 06/30/2011  . Hyperlipidemia 06/30/2011  . Morbid obesity 06/30/2011  . NSTEMI, 06/29/11 06/30/2011  . 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 Ischemic cardiomyopathy, EF 35-40 2D May 2012 06/30/2011  . CHF, acute, mild 06/30/2011    RNarda Bonds6/09/2014, 3:31 PM  [place] [address]: [phone]  Fax:  3[place]06/09/2014 3:31 PM Phone: [phone]: [phone]

## 2014-08-23 NOTE — Therapy (Signed)
[facility] [address] [place], [place], [address] Phone: [phone]   Fax:  [phone]  Occupational Therapy Treatment  Patient Details  Name: Gregory Wiley MRN: [id number] Date of Birth: 06-18-1950 Referring Provider:  Tamsen Roers, MD  Encounter Date: 08/23/2014      OT End of Session - 08/23/14 1228    Visit Number 10   Number of Visits 16   Date for OT Re-Evaluation 09/13/14   Authorization Type [employer]   OT Start Time 1101   OT Stop Time 1145   OT Time Calculation (min) 44 min   Activity Tolerance Patient tolerated treatment well      Past Medical History  Diagnosis Date  . Coronary artery disease     stent, then CABG 07/20/10  . MI (myocardial infarction)     NSTEMI- last cath 06/2011-Stent to LCX-DES, last nuc 07/30/11 low risk  . Diabetes mellitus     hx of, diet controlled  . Hypertension   . Hyperlipidemia   . Obesity (BMI 30-39.9)   . OSA on CPAP     since July 2013  . H/O cardiomyopathy     ischemic, now last echo 07/30/11, EF 38%W  . Diverticulosis   . Internal hemorrhoids   . Tubular adenoma of colon   . Plantar fasciitis of left foot   . Stroke     Past Surgical History  Procedure Laterality Date  . Coronary artery bypass graft  07/20/2010     LIMA-LAD; VG-ACUTE MARG of RCA; Seq VG-distal RCA & then pda  . Coronary angioplasty with stent placement  07/01/2011    DES-Resolute to native LCX  . Coronary angioplasty with stent placement  12/01/2007    COMPLEX 5 LESION PCI INCLUDING CUTTING BALLOON AND 4 CYPHER DESs  . Hernia repair  2006  . Knee surgery Right   . Hand surgery Right     to take glass out  . Colonoscopy N/A 02/10/2013    Procedure: COLONOSCOPY;  Surgeon: Ladene Artist, MD;  Location: [facility] ENDOSCOPY;  Service: Endoscopy;  Laterality: N/A;  . Retinal detachment surgery Right   . Loop recorder implant  02/15/2014    MDT LINQ implanted by Dr Rayann Heman for cryptogenic  stroke  . Tee without cardioversion N/A 02/15/2014    Procedure: TRANSESOPHAGEAL ECHOCARDIOGRAM (TEE);  Surgeon: Josue Hector, MD;  Location: [facility] ENDOSCOPY;  Service: Cardiovascular;  Laterality: N/A;  . Left heart catheterization with coronary/graft angiogram  07/01/2011    Procedure: LEFT HEART CATHETERIZATION WITH Beatrix Fetters;  Surgeon: Sanda Klein, MD;  Location: [facility] CATH LAB;  Service: Cardiovascular;;  . Percutaneous coronary stent intervention (pci-s) Right 07/01/2011    Procedure: PERCUTANEOUS CORONARY STENT INTERVENTION (PCI-S);  Surgeon: Sanda Klein, MD;  Location: [facility] CATH LAB;  Service: Cardiovascular;  Laterality: Right;    There were no vitals filed for this visit.  Visit Diagnosis:  Spastic hemiplegia, dominant side  Generalized weakness  Impaired functional mobility and activity tolerance      Subjective Assessment - 08/23/14 1109    Subjective  I have a headache today   Patient is accompained by: Family member   Pertinent History see epic snapshot, pt with low orthostatic BP, pt with DVT in LLE and PEs - on Zarelto.     Patient Stated Goals walk again, play guitar and sing, wife hopes he gets more use of hand and can do more of his dressing and bathing   Currently in  Pain? Yes   Pain Score 6    Pain Location Head   Pain Orientation --  both temples   Pain Type Acute pain   Pain Onset Today   Aggravating Factors  I woke up with it... I usually get a headache very afternoon   Pain Relieving Factors nothing really helps                      OT Treatments/Exercises (OP) - 08/23/14 0001    ADLs   ADL Comments Addressed bathing and dressing strategies with pt and wife. Please see pt instruction for details.  Pt able to return demonstrate UB bathing techniques and wife to bring in [facility] sponge for [place] to modify for easier use.  Pt also able to return demonstrate use of LH reacher to don pants in sitting (pt is currently already pulling his  pants up at home).  These strategies allow the pt to be mod I with upper body bathing and dressing, supervision with LB bathing and min a for shoes and socks with LB dressing.  PT encouraged to practice daily at home. Pt and wife given instructions and list of equipment to purchase to allow pt greater independence .                OT Education - 08/23/14 1224    Education provided Yes   Education Details ADL strategies   Person(s) Educated Patient;Spouse   Methods Explanation;Demonstration;Verbal cues;Handout   Comprehension Verbalized understanding;Returned demonstration          OT Short Term Goals - 08/23/14 1224    OT SHORT TERM GOAL #1   Title Pt and wife will be mod I with HEP - 08/16/2014   Baseline achieved for beginning- 08/17/14   Status Achieved   OT SHORT TERM GOAL #2   Title Pt will report no more then 3/10 pain with PROM of shoulder flexion to at least 90* to assist with self care.   Status On-going   OT SHORT TERM GOAL #3   Title Pt will require no more than moderate assist for LB dressing   Baseline max A   Status Achieved   OT SHORT TERM GOAL #4   Title Pt will require supervision for UB dressing, mod vc's   Status Achieved   OT SHORT TERM GOAL #5   Title Pt will require no more than min a for toilet transfers   Status Achieved   OT SHORT TERM GOAL #6   Title Pt will require min a to hike pants with toileting   Baseline per pt report   Status Achieved           OT Long Term Goals - 08/23/14 1225    OT LONG TERM GOAL #1   Title Pt and wife will be mod I with upgraded HEP PRN - 09/13/2014   Status On-going   OT LONG TERM GOAL #2   Title Pt will be min a with LB dressing   Baseline --  with AE equipment   Status Achieved   OT LONG TERM GOAL #3   Title Pt will be supervision UB bathing   Status Achieved   OT LONG TERM GOAL #4   Title Pt will be mod a LB bathing   Status Achieved  min a with equipment   OT LONG TERM GOAL #5   Title Pt will  be min a for transfer tub bench into shower   Status On-going  OT LONG TERM GOAL #6   Title Pt will require min a for simple hot meal prep at wheelchair level , standing at counter intermittently with close S   Status On-going   OT LONG TERM GOAL #7   Title Pt will recall 2 strategies for adapted bathing/dressing   Status On-going   OT LONG TERM GOAL #8   Title Pt will tolerate 20 minutes of functional activity without rest breaks   Status On-going               Plan - 08/23/14 1226    Clinical Impression Statement Pt stated he was sorry for acting so poorly last session and remained calm all session today. Pt willing to practice ADL tasks with encouragement. Pt with signifcant improvement in independence with AE.  Encouraged pt to practice at home - discussed need for pt to be as independent as possible given wife's medical issues. Pt in agreement.   Pt will benefit from skilled therapeutic intervention in order to improve on the following deficits (Retired) Decreased activity tolerance;Decreased balance;Decreased endurance;Decreased cognition;Decreased knowledge of use of DME;Decreased mobility;Decreased range of motion;Decreased safety awareness;Decreased strength;Impaired UE functional use;Impaired tone;Pain;Impaired flexibility   Rehab Potential Fair   Clinical Impairments Affecting Rehab Potential Pt will benefit from skilled OT to address the above deficits.   OT Frequency 2x / week   OT Duration 8 weeks   OT Treatment/Interventions Self-care/ADL training;Moist Heat;Electrical Stimulation;DME and/or AE instruction;Neuromuscular education;Therapeutic exercise;Functional Mobility Training;Manual Therapy;Passive range of motion;Splinting;Therapeutic activities;Balance training;Patient/family education;Cognitive remediation/compensation   Plan check ADL's, modify LH sponge, address other LTG's   Consulted and Agree with Plan of Care Patient;Family member/caregiver   Family Member  Consulted wife        Problem List Patient Active Problem List   Diagnosis Date Noted  . Dysphagia 07/26/2014  . Antiplatelet or antithrombotic long-term use 04/22/2014  . Chest pain 04/17/2014  . SOB (shortness of breath) 04/17/2014  . Bilateral pulmonary embolism 04/11/2014  . Left-sided neglect 03/09/2014  . Dysphagia, pharyngoesophageal phase 03/02/2014  . Arthralgia of left ankle 02/24/2014  . Hemiplegia affecting left dominant side 02/16/2014  . 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 Benign neoplasm of colon 02/10/2013  . Special screening for malignant neoplasms, colon 02/10/2013  . OSA on CPAP 01/06/2013  . S/P angioplasty with DES to CFX 07/01/11 07/02/2011  . Sleep apnea, "cant afford C-pap" 07/02/2011  . CAD, RCA PCI '09, 10/11, CABG X 4 5/12 06/30/2011  . Hypertension, B/P has been low this admission 06/30/2011  . Hyperlipidemia 06/30/2011  . Morbid obesity 06/30/2011  . NSTEMI, 06/29/11 06/30/2011  . 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 Ischemic cardiomyopathy, EF 35-40 2D May 2012 06/30/2011  . CHF, acute, mild 06/30/2011    Quay Burow 08/23/2014, 12:34 PM  [place], OTR/L [facility] [address] [place], [place], [address] Phone: [phone]   Fax:  [phone]

## 2014-08-25 ENCOUNTER — Ambulatory Visit: Payer: 59 | Admitting: Neurology

## 2014-08-26 ENCOUNTER — Ambulatory Visit: Payer: 59 | Admitting: Occupational Therapy

## 2014-08-26 ENCOUNTER — Ambulatory Visit: Payer: 59

## 2014-08-26 ENCOUNTER — Other Ambulatory Visit: Payer: Self-pay | Admitting: Neurology

## 2014-08-26 VITALS — BP 105/71

## 2014-08-26 DIAGNOSIS — R262 Difficulty in walking, not elsewhere classified: Secondary | ICD-10-CM | POA: Diagnosis not present

## 2014-08-26 DIAGNOSIS — R29898 Other symptoms and signs involving the musculoskeletal system: Secondary | ICD-10-CM

## 2014-08-26 DIAGNOSIS — Z7409 Other reduced mobility: Secondary | ICD-10-CM

## 2014-08-26 DIAGNOSIS — M25512 Pain in left shoulder: Secondary | ICD-10-CM

## 2014-08-26 DIAGNOSIS — R531 Weakness: Secondary | ICD-10-CM

## 2014-08-26 DIAGNOSIS — G8110 Spastic hemiplegia affecting unspecified side: Secondary | ICD-10-CM

## 2014-08-26 NOTE — Therapy (Signed)
[facility] [address] [place], [place], [address] Phone: [phone]   Fax:  [phone]  Occupational Therapy Treatment  Patient Details  Name: Gregory Wiley MRN: [id number] Date of Birth: 21-Aug-1950 Referring Provider:  Tamsen Roers, MD  Encounter Date: 08/26/2014      OT End of Session - 08/26/14 1553    Visit Number 11   Number of Visits 16   Date for OT Re-Evaluation 09/13/14   Authorization Type [employer]   OT Start Time 1449   OT Stop Time 1530   OT Time Calculation (min) 41 min   Equipment Utilized During Treatment hemi glide   Activity Tolerance Patient limited by fatigue   Behavior During Therapy Flat affect      Past Medical History  Diagnosis Date  . Coronary artery disease     stent, then CABG 07/20/10  . MI (myocardial infarction)     NSTEMI- last cath 06/2011-Stent to LCX-DES, last nuc 07/30/11 low risk  . Diabetes mellitus     hx of, diet controlled  . Hypertension   . Hyperlipidemia   . Obesity (BMI 30-39.9)   . OSA on CPAP     since July 2013  . H/O cardiomyopathy     ischemic, now last echo 07/30/11, EF 38%W  . Diverticulosis   . Internal hemorrhoids   . Tubular adenoma of colon   . Plantar fasciitis of left foot   . Stroke     Past Surgical History  Procedure Laterality Date  . Coronary artery bypass graft  07/20/2010     LIMA-LAD; VG-ACUTE MARG of RCA; Seq VG-distal RCA & then pda  . Coronary angioplasty with stent placement  07/01/2011    DES-Resolute to native LCX  . Coronary angioplasty with stent placement  12/01/2007    COMPLEX 5 LESION PCI INCLUDING CUTTING BALLOON AND 4 CYPHER DESs  . Hernia repair  2006  . Knee surgery Right   . Hand surgery Right     to take glass out  . Colonoscopy N/A 02/10/2013    Procedure: COLONOSCOPY;  Surgeon: Ladene Artist, MD;  Location: [facility] ENDOSCOPY;  Service: Endoscopy;  Laterality: N/A;  . Retinal detachment surgery Right   .  Loop recorder implant  02/15/2014    MDT LINQ implanted by Dr Rayann Heman for cryptogenic stroke  . Tee without cardioversion N/A 02/15/2014    Procedure: TRANSESOPHAGEAL ECHOCARDIOGRAM (TEE);  Surgeon: Josue Hector, MD;  Location: [facility] ENDOSCOPY;  Service: Cardiovascular;  Laterality: N/A;  . Left heart catheterization with coronary/graft angiogram  07/01/2011    Procedure: LEFT HEART CATHETERIZATION WITH Beatrix Fetters;  Surgeon: Sanda Klein, MD;  Location: [facility] CATH LAB;  Service: Cardiovascular;;  . Percutaneous coronary stent intervention (pci-s) Right 07/01/2011    Procedure: PERCUTANEOUS CORONARY STENT INTERVENTION (PCI-S);  Surgeon: Sanda Klein, MD;  Location: [facility] CATH LAB;  Service: Cardiovascular;  Laterality: Right;    There were no vitals filed for this visit.  Visit Diagnosis:  Spastic hemiplegia, dominant side  Generalized weakness  Impaired functional mobility and activity tolerance  Pain in joint, shoulder region, left      Subjective Assessment - 08/26/14 1550    Subjective  I have a headache today   Pertinent History see epic snapshot, pt with low orthostatic BP, pt with DVT in LLE and PEs - on Zarelto.     Patient Stated Goals walk again, play guitar and sing, wife hopes he gets more use of  hand and can do more of his dressing and bathing   Currently in Pain? Yes   Pain Location Head   Pain Type Chronic pain   Pain Onset More than a month ago   Aggravating Factors  headache in afternoon   Pain Relieving Factors nothing   Multiple Pain Sites No      Treatment: Self care: Therapist modified pt's long handled sponges with heat gun for easier use. Pt simulated use. Pt reports assisting more with ADLS at home. Therapist checked progress towards short term goals. Therapist sent an in basket message to Dr. Letta Pate re; referral for pain management. Therapist discussed with pt and wife plans to take a break from therapy at the end of this POC, and that pt may  benefit from additional therapy after pain is better  Theraputic activities:Functional standing balance for ADLS, with gentle weightbearing through LUE over foam dome, while reaching with RUE, min A/ facilitation for weightbearing through LLE. Pt was only able to stand for 3 mins 15 secs max during several trails due to fatigue and back pain. controlled. Theraputic ex: Therapist reviewed HEP with pt// wife as pt's wife was not present the day HEP was issued. Pt. returned demonstration 10 reps each.                          OT Short Term Goals - 08/26/14 1454    OT SHORT TERM GOAL #1   Title Pt and wife will be mod I with HEP - 08/16/2014   Status Achieved   OT SHORT TERM GOAL #2   Title Pt will report no more then 3/10 pain with PROM of shoulder flexion to at least 90* to assist with self care.   Baseline able to acheive 85* with pain 3/10 or less   Status Partially Met   OT SHORT TERM GOAL #3   Title Pt will require no more than moderate assist for LB dressing   Baseline max A shoes,/ socks, mod Afor shorts   Status Partially Met   OT SHORT TERM GOAL #4   Title Pt will require supervision for UB dressing, mod vc's   Status Achieved   OT SHORT TERM GOAL #5   Title Pt will require no more than min a for toilet transfers   Status Achieved   OT SHORT TERM GOAL #6   Title Pt will require min a to hike pants with toileting   Baseline per pt report   Status Achieved           OT Long Term Goals - 08/23/14 1225    OT LONG TERM GOAL #1   Title Pt and wife will be mod I with upgraded HEP PRN - 09/13/2014   Status On-going   OT LONG TERM GOAL #2   Title Pt will be min a with LB dressing   Baseline --  with AE equipment   Status Achieved   OT LONG TERM GOAL #3   Title Pt will be supervision UB bathing   Status Achieved   OT LONG TERM GOAL #4   Title Pt will be mod a LB bathing   Status Achieved  min a with equipment   OT LONG TERM GOAL #5   Title Pt will be  min a for transfer tub bench into shower   Status On-going   OT LONG TERM GOAL #6   Title Pt will require min a for simple hot meal  prep at wheelchair level , standing at counter intermittently with close S   Status On-going   OT LONG TERM GOAL #7   Title Pt will recall 2 strategies for adapted bathing/dressing   Status On-going   OT LONG TERM GOAL #8   Title Pt will tolerate 20 minutes of functional activity without rest breaks   Status On-going               Plan - 08/26/14 1551    Clinical Impression Statement Pt is progressing towards goals. He reports assisting more with ADLS at home and he was more cooperative today.   Pt will benefit from skilled therapeutic intervention in order to improve on the following deficits (Retired) Decreased activity tolerance;Decreased balance;Decreased endurance;Decreased cognition;Decreased knowledge of use of DME;Decreased mobility;Decreased range of motion;Decreased safety awareness;Decreased strength;Impaired UE functional use;Impaired tone;Pain;Impaired flexibility   Rehab Potential Good   Clinical Impairments Affecting Rehab Potential Pt will benefit from skilled OT to address the above deficits.   OT Frequency 2x / week   OT Duration 8 weeks   OT Treatment/Interventions Self-care/ADL training;Moist Heat;Electrical Stimulation;DME and/or AE instruction;Neuromuscular education;Therapeutic exercise;Functional Mobility Training;Manual Therapy;Passive range of motion;Splinting;Therapeutic activities;Balance training;Patient/family education;Cognitive remediation/compensation   Plan continue to address ADLS/ Work towards long term goals   Consulted and Agree with Plan of Care Patient;Family member/caregiver   Family Member Consulted wife        Problem List Patient Active Problem List   Diagnosis Date Noted  . Dysphagia 07/26/2014  . Antiplatelet or antithrombotic long-term use 04/22/2014  . Chest pain 04/17/2014  . SOB (shortness of  breath) 04/17/2014  . Bilateral pulmonary embolism 04/11/2014  . Left-sided neglect 03/09/2014  . Dysphagia, pharyngoesophageal phase 03/02/2014  . Arthralgia of left ankle 02/24/2014  . Hemiplegia affecting left dominant side 02/16/2014  . 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 Benign neoplasm of colon 02/10/2013  . Special screening for malignant neoplasms, colon 02/10/2013  . OSA on CPAP 01/06/2013  . S/P angioplasty with DES to CFX 07/01/11 07/02/2011  . Sleep apnea, "cant afford C-pap" 07/02/2011  . CAD, RCA PCI '09, 10/11, CABG X 4 5/12 06/30/2011  . Hypertension, B/P has been low this admission 06/30/2011  . Hyperlipidemia 06/30/2011  . Morbid obesity 06/30/2011  . NSTEMI, 06/29/11 06/30/2011  . 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 Ischemic cardiomyopathy, EF 35-40 2D May 2012 06/30/2011  . CHF, acute, mild 06/30/2011    Adamariz Gillott 08/26/2014, 3:54 PM Theone Murdoch, OTR/L [phone] Phone: [phone] 3:54 PM 08/26/2014 [facility] [address] [place], [place], [address] Phone: [phone]   Fax:  [phone]

## 2014-08-26 NOTE — Telephone Encounter (Signed)
Request entered, forwarded to provider for approval.  

## 2014-08-26 NOTE — Patient Instructions (Signed)
Ischemic Stroke °A stroke (cerebrovascular accident) is the sudden death of brain tissue. It is a medical emergency. A stroke can cause permanent loss of brain function. This can cause problems with different parts of your body. A transient ischemic attack (TIA) is different because it does not cause permanent damage. A TIA is a short-lived problem of poor blood flow affecting a part of the brain. A TIA is also a serious problem because having a TIA greatly increases the chances of having a stroke. When symptoms first develop, you cannot know if the problem might be a stroke or a TIA. °CAUSES  °A stroke is caused by a decrease of oxygen supply to an area of your brain. It is usually the result of a small blood clot or collection of cholesterol or fat (plaque) that blocks blood flow in the brain. A stroke can also be caused by blocked or damaged carotid arteries.  °RISK FACTORS °· High blood pressure (hypertension). °· High cholesterol. °· Diabetes mellitus. °· Heart disease. °· The buildup of plaque in the blood vessels (peripheral artery disease or atherosclerosis). °· The buildup of plaque in the blood vessels providing blood and oxygen to the brain (carotid artery stenosis). °· An abnormal heart rhythm (atrial fibrillation). °· Obesity. °· Smoking. °· Taking oral contraceptives (especially in combination with smoking). °· Physical inactivity. °· A diet high in fats, salt (sodium), and calories. °· Alcohol use. °· Use of illegal drugs (especially cocaine and methamphetamine). °· Being African American. °· Being over the age of 55. °· Family history of stroke. °· Previous history of blood clots, stroke, TIA, or heart attack. °· Sickle cell disease. °SYMPTOMS  °These symptoms usually develop suddenly, or may be newly present upon awakening from sleep: °· Sudden weakness or numbness of the face, arm, or leg, especially on one side of the body. °· Sudden trouble walking or difficulty moving arms or legs. °· Sudden  confusion. °· Sudden personality changes. °· Trouble speaking (aphasia) or understanding. °· Difficulty swallowing. °· Sudden trouble seeing in one or both eyes. °· Double vision. °· Dizziness. °· Loss of balance or coordination. °· Sudden severe headache with no known cause. °· Trouble reading or writing. °DIAGNOSIS  °Your health care provider can often determine the presence or absence of a stroke based on your symptoms, history, and physical exam. Computed tomography (CT) of the brain is usually performed to confirm the stroke, determine causes, and determine stroke severity. Other tests may be done to find the cause of the stroke. These tests may include: °· Electrocardiography. °· Continuous heart monitoring. °· Echocardiography. °· Carotid ultrasonography. °· Magnetic resonance imaging (MRI). °· A scan of the brain circulation. °· Blood tests. °PREVENTION  °The risk of a stroke can be decreased by appropriately treating high blood pressure, high cholesterol, diabetes, heart disease, and obesity and by quitting smoking, limiting alcohol, and staying physically active. °TREATMENT  °Time is of the essence. It is important to seek treatment at the first sign of these symptoms because you may receive a medicine to dissolve the clot (thrombolytic) that cannot be given if too much time has passed since your symptoms began. Even if you do not know when your symptoms began, get treatment as soon as possible as there are other treatment options available including oxygen, intravenous (IV) fluids, and medicines to thin the blood (anticoagulants). Treatment of stroke depends on the duration, severity, and cause of your symptoms. Medicines and dietary changes may be used to address diabetes, high blood   pressure, and other risk factors. Physical, speech, and occupational therapists will assess you and work with you to improve any functions impaired by the stroke. Measures will be taken to prevent short-term and long-term  complications, including infection from breathing foreign material into the lungs (aspiration pneumonia), blood clots in the legs, bedsores, and falls. Rarely, surgery may be needed to remove large blood clots or to open up blocked arteries. °HOME CARE INSTRUCTIONS  °· Take medicines only as directed by your health care provider. Follow the directions carefully. Medicines may be used to control risk factors for a stroke. Be sure you understand all your medicine instructions. °· You may be told to take a medicine to thin the blood, such as aspirin or the anticoagulant warfarin. Warfarin needs to be taken exactly as instructed. °¨ Too much and too little warfarin are both dangerous. Too much warfarin increases the risk of bleeding. Too little warfarin continues to allow the risk for blood clots. While taking warfarin, you will need to have regular blood tests to measure your blood clotting time. These blood tests usually include both the PT and INR tests. The PT and INR results allow your health care provider to adjust your dose of warfarin. The dose can change for many reasons. It is critically important that you take warfarin exactly as prescribed, and that you have your PT and INR levels drawn exactly as directed. °¨ Many foods, especially foods high in vitamin K, can interfere with warfarin and affect the PT and INR results. Foods high in vitamin K include spinach, kale, broccoli, cabbage, collard and turnip greens, brussels sprouts, peas, cauliflower, seaweed, and parsley, as well as beef and pork liver, green tea, and soybean oil. You should eat a consistent amount of foods high in vitamin K. Avoid major changes in your diet, or notify your health care provider before changing your diet. Arrange a visit with a dietitian to answer your questions. °¨ Many medicines can interfere with warfarin and affect the PT and INR results. You must tell your health care provider about any and all medicines you take. This  includes all vitamins and supplements. Be especially cautious with aspirin and anti-inflammatory medicines. Do not take or discontinue any prescribed or over-the-counter medicine except on the advice of your health care provider or pharmacist. °¨ Warfarin can have side effects, such as excessive bruising or bleeding. You will need to hold pressure over cuts for longer than usual. Your health care provider or pharmacist will discuss other potential side effects. °¨ Avoid sports or activities that may cause injury or bleeding. °¨ Be mindful when shaving, flossing your teeth, or handling sharp objects. °¨ Alcohol can change the body's ability to handle warfarin. It is best to avoid alcoholic drinks or consume only very small amounts while taking warfarin. Notify your health care provider if you change your alcohol intake. °¨ Notify your dentist or other health care providers before procedures. °· If swallow studies have determined that your swallowing reflex is present, you should eat healthy foods. Including 5 or more servings of fruits and vegetables a day may reduce the risk of stroke. Foods may need to be a certain consistency (soft or pureed), or small bites may need to be taken in order to avoid aspirating or choking. Certain dietary changes may be advised to address high blood pressure, high cholesterol, diabetes, or obesity. °¨ Food choices that are low in sodium, saturated fat, trans fat, and cholesterol are recommended to manage high blood pressure. °¨   Food choies that are high in fiber, and low in saturated fat, trans fat, and cholesterol may control cholesterol levels. °¨ Controlling carbohydrates and sugar intake is recommended to manage diabetes. °¨ Reducing calorie intake and making food choices that are low in sodium, saturated fat, trans fat, and cholesterol are recommended to manage obesity. °· Maintain a healthy weight. °· Stay physically active. It is recommended that you get at least 30 minutes of  activity on all or most days. °· Do not use any tobacco products including cigarettes, chewing tobacco, or electronic cigarettes. °· Limit alcohol use even if you are not taking warfarin. Moderate alcohol use is considered to be: °¨ No more than 2 drinks each day for men. °¨ No more than 1 drink each day for nonpregnant women. °· Home safety. A safe home environment is important to reduce the risk of falls. Your health care provider may arrange for specialists to evaluate your home. Having grab bars in the bedroom and bathroom is often important. Your health care provider may arrange for equipment to be used at home, such as raised toilets and a seat for the shower. °· Physical, occupational, and speech therapy. Ongoing therapy may be needed to maximize your recovery after a stroke. If you have been advised to use a walker or a cane, use it at all times. Be sure to keep your therapy appointments. °· Follow all instructions for follow-up with your health care provider. This is very important. This includes any referrals, physical therapy, rehabilitation, and lab tests. Proper follow-up can prevent another stroke from occurring. °SEEK MEDICAL CARE IF: °· You have personality changes. °· You have difficulty swallowing. °· You are seeing double. °· You have dizziness. °· You have a fever. °· You have skin breakdown. °SEEK IMMEDIATE MEDICAL CARE IF:  °Any of these symptoms may represent a serious problem that is an emergency. Do not wait to see if the symptoms will go away. Get medical help right away. Call your local emergency services (911 in [place] Do not drive yourself to the hospital. °· You have sudden weakness or numbness of the face, arm, or leg, especially on one side of the body. °· You have sudden trouble walking or difficulty moving arms or legs. °· You have sudden confusion. °· You have trouble speaking (aphasia) or understanding. °· You have sudden trouble seeing in one or both eyes. °· You have a loss of  balance or coordination. °· You have a sudden, severe headache with no known cause. °· You have new chest pain or an irregular heartbeat. °· You have a partial or total loss of consciousness. °Document Released: 03/04/2005 Document Revised: 07/19/2013 Document Reviewed: 10/13/2011 °[employer]® Patient Information ©2015 [employer]. This information is not intended to replace advice given to you by your health care provider. Make sure you discuss any questions you have with your health care provider. ° °

## 2014-08-26 NOTE — Telephone Encounter (Signed)
Patients wife called and requested a refill on Rx. methylphenidate (RITALIN) 10 MG tablet.

## 2014-08-27 NOTE — Therapy (Signed)
[facility] [address] [place], [place], [address] Phone: [phone]   Fax:  [phone]  Physical Therapy Treatment  Patient Details  Name: Gregory Wiley MRN: [id number] Date of Birth: 18-Nov-1950 Referring Provider:  Tamsen Roers, MD  Encounter Date: 08/26/2014      PT End of Session - 08/27/14 1338    Visit Number 10   Number of Visits 17   Date for PT Re-Evaluation 09/17/14   Authorization Type UHC, Medicare will be active 11/17/14.   PT Start Time 1532   PT Stop Time 1613   PT Time Calculation (min) 41 min   Equipment Utilized During Treatment Gait belt   Activity Tolerance Patient limited by fatigue   Behavior During Therapy Flat affect      Past Medical History  Diagnosis Date  . Coronary artery disease     stent, then CABG 07/20/10  . MI (myocardial infarction)     NSTEMI- last cath 06/2011-Stent to LCX-DES, last nuc 07/30/11 low risk  . Diabetes mellitus     hx of, diet controlled  . Hypertension   . Hyperlipidemia   . Obesity (BMI 30-39.9)   . OSA on CPAP     since July 2013  . H/O cardiomyopathy     ischemic, now last echo 07/30/11, EF 38%W  . Diverticulosis   . Internal hemorrhoids   . Tubular adenoma of colon   . Plantar fasciitis of left foot   . Stroke     Past Surgical History  Procedure Laterality Date  . Coronary artery bypass graft  07/20/2010     LIMA-LAD; VG-ACUTE MARG of RCA; Seq VG-distal RCA & then pda  . Coronary angioplasty with stent placement  07/01/2011    DES-Resolute to native LCX  . Coronary angioplasty with stent placement  12/01/2007    COMPLEX 5 LESION PCI INCLUDING CUTTING BALLOON AND 4 CYPHER DESs  . Hernia repair  2006  . Knee surgery Right   . Hand surgery Right     to take glass out  . Colonoscopy N/A 02/10/2013    Procedure: COLONOSCOPY;  Surgeon: Ladene Artist, MD;  Location: [facility] ENDOSCOPY;  Service: Endoscopy;  Laterality: N/A;  . Retinal detachment surgery  Right   . Loop recorder implant  02/15/2014    MDT LINQ implanted by Dr Rayann Heman for cryptogenic stroke  . Tee without cardioversion N/A 02/15/2014    Procedure: TRANSESOPHAGEAL ECHOCARDIOGRAM (TEE);  Surgeon: Josue Hector, MD;  Location: [facility] ENDOSCOPY;  Service: Cardiovascular;  Laterality: N/A;  . Left heart catheterization with coronary/graft angiogram  07/01/2011    Procedure: LEFT HEART CATHETERIZATION WITH Beatrix Fetters;  Surgeon: Sanda Klein, MD;  Location: [facility] CATH LAB;  Service: Cardiovascular;;  . Percutaneous coronary stent intervention (pci-s) Right 07/01/2011    Procedure: PERCUTANEOUS CORONARY STENT INTERVENTION (PCI-S);  Surgeon: Sanda Klein, MD;  Location: [facility] CATH LAB;  Service: Cardiovascular;  Laterality: Right;    Filed Vitals:   08/26/14 1538  BP: 105/71    Visit Diagnosis:  Difficulty walking  Left leg weakness      Subjective Assessment - 08/26/14 1538    Subjective Pt denied falls or changes since last visit.    Patient is accompained by: Family member   Pertinent History MI, CABG, DM, CA   Patient Stated Goals walk better and longer distances   Currently in Pain? Yes   Pain Score 5    Pain Location Head   Pain Orientation Other (  Comment)  B temple area   Pain Descriptors / Indicators --  feels like a hangover   Pain Type Chronic pain   Pain Onset More than a month ago   Pain Frequency Intermittent   Aggravating Factors  headache in the afternoon    Pain Relieving Factors nothing really helps          Gait:   Pt ambulated 175' with min guard to min A with L reaction AFO donned and L simulated shoe cap, with min guard to min A during 2 LOB episodes. Heel wedge not used today. Cues to improve stride length, upright posture, decrease R lateral trunk lean, decrease L genu recurvatum and to improve L foot clearance. Pt required 3 seated rest breaks during ambulation due fatigue. Pt did not assist in don/doffing of L AFO or shoe cap. Pt had  to decrease gait speed due to fatigue.                 PT Education - 08/27/14 1337    Education provided Yes   Education Details PT educated pt on CVA signs/symptoms and risk factors. PT also educated pt on plan to d/c at end of current POC in order for pt to see MD regarding pain as it limits ability during PT session. PT also educated pt on performing HEP and walking, as it will assist in increasing energy.    Person(s) Educated Patient;Spouse   Methods Explanation   Comprehension Verbalized understanding          PT Short Term Goals - 08/23/14 1522    PT SHORT TERM GOAL #1   Title Pt will be independent in HEP to improve strength, balance, endurance, and safety. Target date: 08/16/14.   Status Not Met   PT SHORT TERM GOAL #2   Title Perform BERG and write goals if appropriate. Target date: 08/16/14.  pt was unable to complete full Berg today due to nausea and dizziness symptoms    Status Not Met   PT SHORT TERM GOAL #3   Title Pt will perform sit<>stand with UE assist at MOD I level to improve functional mobilty. Target date: 08/16/14.  pt partially met, requires CGA for transfers and multiple attempts   Status Not Met   PT SHORT TERM GOAL #4   Title Pt will ambulate 200' over even terrain with LRAD and supervision to improve functional mobility. Target date: 08/16/14.   Status Not Met   PT SHORT TERM GOAL #5   Title Pt will perform TUG in </=90 seconds to reduce falls risk. Target date: 08/16/14.   Status Achieved           PT Long Term Goals - 08/27/14 1340    PT LONG TERM GOAL #1   Title Pt will verbalize understanding of CVA signs/symptoms and risk factors to reduce risk of another CVA. Target date: 09/13/14.   Status On-going   PT LONG TERM GOAL #2   Title Pt will report no falls over the last four weeks to improve safety during functional mobility. Target date; 09/13/14.   Status On-going   PT LONG TERM GOAL #3   Title Pt will ambulate 400' over  even/uneven terrain with LRAD at MOD I level to improve functional mobililty. Target date: 09/13/14.   Status On-going   PT LONG TERM GOAL #4   Title Pt's SIS-mobility score will improve by 10% to improve quality of life. Target date: 09/13/14.   Status On-going   PT  LONG TERM GOAL #5   Title Pt's gait speed will improve to >/=1.3f/sec. with LRAD to reduce falls risk. Target date: 09/13/14.   Status On-going   Additional Long Term Goals   Additional Long Term Goals Yes   PT LONG TERM GOAL #6   Title Pt will improve BERG score to >/=32/56 to reduce falls risk. Target date: 09/13/14.   Status New               Plan - 08/27/14 1339    Clinical Impression Statement Pt with limited participation due to fatigue and headache pain. Pt was motivated to continue with ambulation, once he started to amb. during session but did require encouragement. Continue with POC.   Pt will benefit from skilled therapeutic intervention in order to improve on the following deficits Difficulty walking;Decreased safety awareness;Decreased endurance;Obesity;Decreased knowledge of use of DME;Decreased balance;Decreased mobility;Decreased strength;Impaired UE functional use;Other (comment)   Rehab Potential Good   PT Frequency 2x / week   PT Duration 8 weeks   PT Treatment/Interventions ADLs/Self Care Home Management;Gait training;Neuromuscular re-education;Biofeedback;Functional mobility training;Patient/family education;Cryotherapy;Therapeutic activities;Wheelchair mobility training;Electrical Stimulation;Therapeutic exercise;Manual techniques;DME Instruction;Balance training;Other (comment)   PT Next Visit Plan Gait training, balance, L LE strengthening.   Consulted and Agree with Plan of Care Patient   Family Member Consulted wife-Laura        Problem List Patient Active Problem List   Diagnosis Date Noted  . Dysphagia 07/26/2014  . Antiplatelet or antithrombotic long-term use 04/22/2014  . Chest pain  04/17/2014  . SOB (shortness of breath) 04/17/2014  . Bilateral pulmonary embolism 04/11/2014  . Left-sided neglect 03/09/2014  . Dysphagia, pharyngoesophageal phase 03/02/2014  . Arthralgia of left ankle 02/24/2014  . Hemiplegia affecting left dominant side 02/16/2014  . 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 Benign neoplasm of colon 02/10/2013  . Special screening for malignant neoplasms, colon 02/10/2013  . OSA on CPAP 01/06/2013  . S/P angioplasty with DES to CFX 07/01/11 07/02/2011  . Sleep apnea, "cant afford C-pap" 07/02/2011  . CAD, RCA PCI '09, 10/11, CABG X 4 5/12 06/30/2011  . Hypertension, B/P has been low this admission 06/30/2011  . Hyperlipidemia 06/30/2011  . Morbid obesity 06/30/2011  . NSTEMI, 06/29/11 06/30/2011  . 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 Ischemic cardiomyopathy, EF 35-40 2D May 2012 06/30/2011  . CHF, acute, mild 06/30/2011    Zuma Hust L 08/27/2014, 1:42 PM  [facility] [address]Phone: [phone]  Fax:  [phone]    JGeoffry Paradise PT,DPT 08/27/2014 1:42 PM Phone: [phone]: [phone]

## 2014-08-28 MED ORDER — METHYLPHENIDATE HCL 10 MG PO TABS: 10.0000 mg | ORAL_TABLET | Freq: Two times a day (BID) | ORAL | Status: DC

## 2014-08-29 NOTE — Progress Notes (Signed)
Methylphenidate 10mg  prescription is available at front desk.

## 2014-08-30 ENCOUNTER — Ambulatory Visit: Payer: 59

## 2014-08-30 ENCOUNTER — Encounter: Payer: Self-pay | Admitting: Occupational Therapy

## 2014-08-30 ENCOUNTER — Ambulatory Visit: Payer: 59 | Admitting: Occupational Therapy

## 2014-08-30 DIAGNOSIS — R262 Difficulty in walking, not elsewhere classified: Secondary | ICD-10-CM | POA: Diagnosis not present

## 2014-08-30 DIAGNOSIS — R531 Weakness: Secondary | ICD-10-CM

## 2014-08-30 DIAGNOSIS — G8110 Spastic hemiplegia affecting unspecified side: Secondary | ICD-10-CM

## 2014-08-30 DIAGNOSIS — Z7409 Other reduced mobility: Secondary | ICD-10-CM

## 2014-08-30 NOTE — Therapy (Signed)
[facility] [address] [place], [place], [address] Phone: [phone]   Fax:  [phone]  Occupational Therapy Treatment  Patient Details  Name: Gregory Wiley MRN: [id number] Date of Birth: 08-15-1950 Referring Provider:  Tamsen Roers, MD  Encounter Date: 08/30/2014      OT End of Session - 08/30/14 1235    Visit Number 12   Number of Visits 16   Date for OT Re-Evaluation 09/13/14   Authorization Type [employer]   OT Start Time 1146   OT Stop Time 1227   OT Time Calculation (min) 41 min      Past Medical History  Diagnosis Date  . Coronary artery disease     stent, then CABG 07/20/10  . MI (myocardial infarction)     NSTEMI- last cath 06/2011-Stent to LCX-DES, last nuc 07/30/11 low risk  . Diabetes mellitus     hx of, diet controlled  . Hypertension   . Hyperlipidemia   . Obesity (BMI 30-39.9)   . OSA on CPAP     since July 2013  . H/O cardiomyopathy     ischemic, now last echo 07/30/11, EF 38%W  . Diverticulosis   . Internal hemorrhoids   . Tubular adenoma of colon   . Plantar fasciitis of left foot   . Stroke     Past Surgical History  Procedure Laterality Date  . Coronary artery bypass graft  07/20/2010     LIMA-LAD; VG-ACUTE MARG of RCA; Seq VG-distal RCA & then pda  . Coronary angioplasty with stent placement  07/01/2011    DES-Resolute to native LCX  . Coronary angioplasty with stent placement  12/01/2007    COMPLEX 5 LESION PCI INCLUDING CUTTING BALLOON AND 4 CYPHER DESs  . Hernia repair  2006  . Knee surgery Right   . Hand surgery Right     to take glass out  . Colonoscopy N/A 02/10/2013    Procedure: COLONOSCOPY;  Surgeon: Ladene Artist, MD;  Location: [facility] ENDOSCOPY;  Service: Endoscopy;  Laterality: N/A;  . Retinal detachment surgery Right   . Loop recorder implant  02/15/2014    MDT LINQ implanted by Dr Rayann Heman for cryptogenic stroke  . Tee without cardioversion N/A 02/15/2014   Procedure: TRANSESOPHAGEAL ECHOCARDIOGRAM (TEE);  Surgeon: Josue Hector, MD;  Location: [facility] ENDOSCOPY;  Service: Cardiovascular;  Laterality: N/A;  . Left heart catheterization with coronary/graft angiogram  07/01/2011    Procedure: LEFT HEART CATHETERIZATION WITH Beatrix Fetters;  Surgeon: Sanda Klein, MD;  Location: [facility] CATH LAB;  Service: Cardiovascular;;  . Percutaneous coronary stent intervention (pci-s) Right 07/01/2011    Procedure: PERCUTANEOUS CORONARY STENT INTERVENTION (PCI-S);  Surgeon: Sanda Klein, MD;  Location: [facility] CATH LAB;  Service: Cardiovascular;  Laterality: Right;    There were no vitals filed for this visit.  Visit Diagnosis:  Spastic hemiplegia, dominant side  Generalized weakness  Impaired functional mobility and activity tolerance      Subjective Assessment - 08/30/14 1149    Subjective  I feel nauseated and I have a headache   Patient is accompained by: Family member  wife   Pertinent History see epic snapshot, pt with low orthostatic BP, pt with DVT in LLE and PEs - on Zarelto.     Patient Stated Goals walk again, play guitar and sing, wife hopes he gets more use of hand and can do more of his dressing and bathing   Currently in Pain? Yes   Pain  Score 2    Pain Location Head   Pain Descriptors / Indicators Aching   Pain Type Chronic pain  pt states he has daily headaches and MD is aware   Pain Onset More than a month ago   Pain Frequency Constant   Aggravating Factors  get it every day so I have no idea   Effect of Pain on Daily Activities meds, rest                      OT Treatments/Exercises (OP) - 08/30/14 0001    ADLs   Functional Mobility Practiced using leg lifter to aide in tub bench transfers and car transfers.  Simulated lifting leg over edge of tub. Pt able to do with min a.  Wife to purchase and they are to practice at home.  Addressed functional ambulation in the kitchen with quad cane and trial AFO from PT.  Pt  required min contact guard and mod vc's for safety and to incoporate L side into activity to obtain pot, get water, transport pot to stove and then drain water. Pt given "homework" to prepare breakfast for wife. WIfe to provide min a for balance and safey as needed.  Pt with very poor endurance - fatigues after about 6 minutes of activity at ambulatory level.                  OT Short Term Goals - 08/30/14 1233    OT SHORT TERM GOAL #1   Title Pt and wife will be mod I with HEP - 08/16/2014   Status Achieved   OT SHORT TERM GOAL #2   Title Pt will report no more then 3/10 pain with PROM of shoulder flexion to at least 90* to assist with self care.   Baseline able to acheive 85* with pain 3/10 or less   Status Partially Met   OT SHORT TERM GOAL #3   Title Pt will require no more than moderate assist for LB dressing   Baseline max A shoes,/ socks, mod Afor shorts   Status Partially Met   OT SHORT TERM GOAL #4   Title Pt will require supervision for UB dressing, mod vc's   Status Achieved   OT SHORT TERM GOAL #5   Title Pt will require no more than min a for toilet transfers   Status Achieved   OT SHORT TERM GOAL #6   Title Pt will require min a to hike pants with toileting   Baseline per pt report   Status Achieved           OT Long Term Goals - 08/30/14 1233    OT LONG TERM GOAL #1   Title Pt and wife will be mod I with upgraded HEP PRN - 09/13/2014   Status On-going   OT LONG TERM GOAL #2   Title Pt will be min a with LB dressing   Baseline --  with AE equipment   Status Achieved   OT LONG TERM GOAL #3   Title Pt will be supervision UB bathing   Status Achieved   OT LONG TERM GOAL #4   Title Pt will be mod a LB bathing   Status Achieved  min a with equipment   OT LONG TERM GOAL #5   Title Pt will be min a for transfer tub bench into shower   Status Achieved   OT LONG TERM GOAL #6   Title Pt will require min  a for simple hot meal prep at wheelchair level ,  standing at counter intermittently with close S   Status On-going   OT LONG TERM GOAL #7   Title Pt will recall 2 strategies for adapted bathing/dressing   Status Achieved   OT LONG TERM GOAL #8   Title Pt will tolerate 20 minutes of functional activity without rest breaks   Status On-going               Plan - 08/30/14 1234    Clinical Impression Statement Pt progressing toward goals with encouragement. Pt reported today that he dressed himself this morning except for socks and shoes and bathed himself in the shower.   Pt will benefit from skilled therapeutic intervention in order to improve on the following deficits (Retired) Decreased activity tolerance;Decreased balance;Decreased endurance;Decreased cognition;Decreased knowledge of use of DME;Decreased mobility;Decreased range of motion;Decreased safety awareness;Decreased strength;Impaired UE functional use;Impaired tone;Pain;Impaired flexibility   Rehab Potential Good   Clinical Impairments Affecting Rehab Potential Pt will benefit from skilled OT to address the above deficits.   OT Frequency 2x / week   OT Duration 8 weeks   OT Treatment/Interventions Self-care/ADL training;Moist Heat;Electrical Stimulation;DME and/or AE instruction;Neuromuscular education;Therapeutic exercise;Functional Mobility Training;Manual Therapy;Passive range of motion;Splinting;Therapeutic activities;Balance training;Patient/family education;Cognitive remediation/compensation   Plan check to see if pt made breakfast, activity tolerance at ambulatory level,    Consulted and Agree with Plan of Care Patient;Family member/caregiver   Family Member Consulted wife        Problem List Patient Active Problem List   Diagnosis Date Noted  . Dysphagia 07/26/2014  . Antiplatelet or antithrombotic long-term use 04/22/2014  . Chest pain 04/17/2014  . SOB (shortness of breath) 04/17/2014  . Bilateral pulmonary embolism 04/11/2014  . Left-sided neglect  03/09/2014  . Dysphagia, pharyngoesophageal phase 03/02/2014  . Arthralgia of left ankle 02/24/2014  . Hemiplegia affecting left dominant side 02/16/2014  . 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 Benign neoplasm of colon 02/10/2013  . Special screening for malignant neoplasms, colon 02/10/2013  . OSA on CPAP 01/06/2013  . S/P angioplasty with DES to CFX 07/01/11 07/02/2011  . Sleep apnea, "cant afford C-pap" 07/02/2011  . CAD, RCA PCI '09, 10/11, CABG X 4 5/12 06/30/2011  . Hypertension, B/P has been low this admission 06/30/2011  . Hyperlipidemia 06/30/2011  . Morbid obesity 06/30/2011  . NSTEMI, 06/29/11 06/30/2011  . 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 Ischemic cardiomyopathy, EF 35-40 2D May 2012 06/30/2011  . CHF, acute, mild 06/30/2011    Quay Burow, OTR/L 08/30/2014, 12:37 PM  [facility] [address] [place], [place], [address] Phone: [phone]   Fax:  [phone]

## 2014-08-30 NOTE — Therapy (Signed)
[facility] [address] [place], [place], [address] Phone: [phone]   Fax:  [phone]  Physical Therapy Treatment  Patient Details  Name: Gregory Wiley MRN: [id number] Date of Birth: 05-Sep-1950 Referring Provider:  Tamsen Roers, MD  Encounter Date: 08/30/2014      PT End of Session - 08/30/14 1144    Visit Number 11   Number of Visits 17   Date for PT Re-Evaluation 09/17/14   Authorization Type UHC, Medicare will be active 11/17/14.   PT Start Time 1102   PT Stop Time 1141   PT Time Calculation (min) 39 min   Equipment Utilized During Treatment Gait belt   Activity Tolerance Other (comment)  limited by nauseated   Behavior During Therapy Flat affect      Past Medical History  Diagnosis Date  . Coronary artery disease     stent, then CABG 07/20/10  . MI (myocardial infarction)     NSTEMI- last cath 06/2011-Stent to LCX-DES, last nuc 07/30/11 low risk  . Diabetes mellitus     hx of, diet controlled  . Hypertension   . Hyperlipidemia   . Obesity (BMI 30-39.9)   . OSA on CPAP     since July 2013  . H/O cardiomyopathy     ischemic, now last echo 07/30/11, EF 38%W  . Diverticulosis   . Internal hemorrhoids   . Tubular adenoma of colon   . Plantar fasciitis of left foot   . Stroke     Past Surgical History  Procedure Laterality Date  . Coronary artery bypass graft  07/20/2010     LIMA-LAD; VG-ACUTE MARG of RCA; Seq VG-distal RCA & then pda  . Coronary angioplasty with stent placement  07/01/2011    DES-Resolute to native LCX  . Coronary angioplasty with stent placement  12/01/2007    COMPLEX 5 LESION PCI INCLUDING CUTTING BALLOON AND 4 CYPHER DESs  . Hernia repair  2006  . Knee surgery Right   . Hand surgery Right     to take glass out  . Colonoscopy N/A 02/10/2013    Procedure: COLONOSCOPY;  Surgeon: Ladene Artist, MD;  Location: [facility] END[facility];  Service: Endoscopy;  Laterality: N/A;  . Retinal  detachment surgery Right   . Loop recorder implant  02/15/2014    MDT LINQ implanted by Dr Rayann Heman for cryptogenic stroke  . Tee without cardioversion N/A 02/15/2014    Procedure: TRANSESOPHAGEAL ECHOCARDIOGRAM (TEE);  Surgeon: Josue Hector, MD;  Location: [facility] ENDOSCOPY;  Service: Cardiovascular;  Laterality: N/A;  . Left heart catheterization with coronary/graft angiogram  07/01/2011    Procedure: LEFT HEART CATHETERIZATION WITH Beatrix Fetters;  Surgeon: Sanda Klein, MD;  Location: [facility] CATH LAB;  Service: Cardiovascular;;  . Percutaneous coronary stent intervention (pci-s) Right 07/01/2011    Procedure: PERCUTANEOUS CORONARY STENT INTERVENTION (PCI-S);  Surgeon: Sanda Klein, MD;  Location: [facility] CATH LAB;  Service: Cardiovascular;  Laterality: Right;    There were no vitals filed for this visit.  Visit Diagnosis:  Difficulty walking      Subjective Assessment - 08/30/14 1106    Subjective Pt denied or changes since last visit.   Patient is accompained by: Family member   Pertinent History MI, CABG, DM, CA   Patient Stated Goals walk better and longer distances   Currently in Pain? No/denies   Pain Score 2    Pain Location Head   Pain Orientation --  headache "hangover-like pain"   Pain  Descriptors / Indicators Aching   Pain Type Chronic pain   Pain Onset More than a month ago   Pain Frequency Constant   Aggravating Factors  sitting for long periods   Pain Relieving Factors leaning back to support head                         [facility] Adult PT Treatment/Exercise - 08/30/14 1112    Ambulation/Gait   Ambulation/Gait Yes   Ambulation/Gait Assistance 4: Min guard   Ambulation/Gait Assistance Details L posterior AFO donned with 1/4" heel wedge. VC's to decrease L genu recurvatum, upright posture, and stride length. Pt required two seated rest breaks due to fatigue and had to cease PT early due to pt feeling nauseated while ambulating.   Ambulation Distance  (Feet) --  80', 35', 50'   Assistive device Large base quad cane   Gait Pattern Step-to pattern;Decreased stance time - left;Decreased step length - right;Left circumduction;Left foot flat;Decreased hip/knee flexion - left;Left genu recurvatum   Ambulation Surface Level;Indoor      BP and HR was 106/80 and 105bpm after amb., when pt reported that he was too nauseated to ambulate.          PT Education - 08/30/14 1237    Education provided Yes   Education Details PT educated pt to take medications as prescribed by MD, as he reported he did not take meds as he felt nauseated this am.   Person(s) Educated Patient;Spouse   Methods Explanation   Comprehension Verbalized understanding          PT Short Term Goals - 08/23/14 1522    PT SHORT TERM GOAL #1   Title Pt will be independent in HEP to improve strength, balance, endurance, and safety. Target date: 08/16/14.   Status Not Met   PT SHORT TERM GOAL #2   Title Perform BERG and write goals if appropriate. Target date: 08/16/14.  pt was unable to complete full Berg today due to nausea and dizziness symptoms    Status Not Met   PT SHORT TERM GOAL #3   Title Pt will perform sit<>stand with UE assist at MOD I level to improve functional mobilty. Target date: 08/16/14.  pt partially met, requires CGA for transfers and multiple attempts   Status Not Met   PT SHORT TERM GOAL #4   Title Pt will ambulate 200' over even terrain with LRAD and supervision to improve functional mobility. Target date: 08/16/14.   Status Not Met   PT SHORT TERM GOAL #5   Title Pt will perform TUG in </=90 seconds to reduce falls risk. Target date: 08/16/14.   Status Achieved           PT Long Term Goals - 08/30/14 1240    PT LONG TERM GOAL #1   Title Pt will verbalize understanding of CVA signs/symptoms and risk factors to reduce risk of another CVA. Target date: 09/13/14.   Status On-going   PT LONG TERM GOAL #2   Title Pt will report no falls over  the last four weeks to improve safety during functional mobility. Target date; 09/13/14.   Status On-going   PT LONG TERM GOAL #3   Title Pt will ambulate 400' over even/uneven terrain with LRAD at MOD I level to improve functional mobililty. Target date: 09/13/14.   Status On-going   PT LONG TERM GOAL #4   Title Pt's SIS-mobility score will improve by 10% to improve  quality of life. Target date: 09/13/14.   Status On-going   PT LONG TERM GOAL #5   Title Pt's gait speed will improve to >/=1.57ft/sec. with LRAD to reduce falls risk. Target date: 09/13/14.   Status On-going   PT LONG TERM GOAL #6   Title Pt will improve BERG score to >/=32/56 to reduce falls risk. Target date: 09/13/14.   Status On-going               Plan - 08/30/14 1144    Clinical Impression Statement Pt limited today due to feeling nauseated, pt required seated rest break, nausea bag and cool wash cloth on neck. Pt ambulated in very slow manner, as he reported this helps his nausea. Pt did demonstrated progress during ambulation, as he was able to amb. longer distances today and was able to decrease L genu recurvatum with VC's. Continue with POC.   Pt will benefit from skilled therapeutic intervention in order to improve on the following deficits Difficulty walking;Decreased safety awareness;Decreased endurance;Obesity;Decreased knowledge of use of DME;Decreased balance;Decreased mobility;Decreased strength;Impaired UE functional use;Other (comment)   Rehab Potential Good   PT Frequency 2x / week   PT Duration 8 weeks   PT Treatment/Interventions ADLs/Self Care Home Management;Gait training;Neuromuscular re-education;Biofeedback;Functional mobility training;Patient/family education;Cryotherapy;Therapeutic activities;Wheelchair mobility training;Electrical Stimulation;Therapeutic exercise;Manual techniques;DME Instruction;Balance training;Other (comment)   PT Next Visit Plan Gait training, balance, L LE strengthening.    Consulted and Agree with Plan of Care Patient   Family Member Consulted wife-Laura        Problem List Patient Active Problem List   Diagnosis Date Noted  . Dysphagia 07/26/2014  . Antiplatelet or antithrombotic long-term use 04/22/2014  . Chest pain 04/17/2014  . SOB (shortness of breath) 04/17/2014  . Bilateral pulmonary embolism 04/11/2014  . Left-sided neglect 03/09/2014  . Dysphagia, pharyngoesophageal phase 03/02/2014  . Arthralgia of left ankle 02/24/2014  . Hemiplegia affecting left dominant side 02/16/2014  . 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 Benign neoplasm of colon 02/10/2013  . Special screening for malignant neoplasms, colon 02/10/2013  . OSA on CPAP 01/06/2013  . S/P angioplasty with DES to CFX 07/01/11 07/02/2011  . Sleep apnea, "cant afford C-pap" 07/02/2011  . CAD, RCA PCI '09, 10/11, CABG X 4 5/12 06/30/2011  . Hypertension, B/P has been low this admission 06/30/2011  . Hyperlipidemia 06/30/2011  . Morbid obesity 06/30/2011  . NSTEMI, 06/29/11 06/30/2011  . 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 Ischemic cardiomyopathy, EF 35-40 2D May 2012 06/30/2011  . CHF, acute, mild 06/30/2011    Beverlyann Broxterman L 08/30/2014, 12:41 PM  [facility] [address] [place], [place], [address] Phone: [phone]   Fax:  [phone]     Geoffry Paradise, PT,DPT 08/30/2014 12:41 PM Phone: [phone] Fax: [phone]

## 2014-09-02 ENCOUNTER — Encounter: Payer: Self-pay | Admitting: Occupational Therapy

## 2014-09-02 ENCOUNTER — Ambulatory Visit: Payer: 59 | Admitting: Occupational Therapy

## 2014-09-02 ENCOUNTER — Ambulatory Visit: Payer: 59

## 2014-09-02 DIAGNOSIS — R262 Difficulty in walking, not elsewhere classified: Secondary | ICD-10-CM

## 2014-09-02 DIAGNOSIS — R29898 Other symptoms and signs involving the musculoskeletal system: Secondary | ICD-10-CM

## 2014-09-02 DIAGNOSIS — G8110 Spastic hemiplegia affecting unspecified side: Secondary | ICD-10-CM

## 2014-09-02 NOTE — Therapy (Signed)
[facility] [address] [place], [place], [address] Phone: [phone]   Fax:  [phone]  Physical Therapy Treatment  Patient Details  Name: Gregory Wiley MRN: [id number] Date of Birth: [birth year] Referring Provider:  Tamsen Roers, MD  Encounter Date: 09/02/2014      PT End of Session - 09/02/14 1504    Visit Number 12   Number of Visits 17   Date for PT Re-Evaluation 09/17/14   Authorization Type UHC, Medicare will be active 11/17/14.   PT Start Time 1403   PT Stop Time 1446   PT Time Calculation (min) 43 min   Equipment Utilized During Treatment Gait belt   Activity Tolerance Patient limited by fatigue   Behavior During Therapy Flat affect      Past Medical History  Diagnosis Date  . Coronary artery disease     stent, then CABG 07/20/10  . MI (myocardial infarction)     NSTEMI- last cath 06/2011-Stent to LCX-DES, last nuc 07/30/11 low risk  . Diabetes mellitus     hx of, diet controlled  . Hypertension   . Hyperlipidemia   . Obesity (BMI 30-39.9)   . OSA on CPAP     since July 2013  . H/O cardiomyopathy     ischemic, now last echo 07/30/11, EF 38%W  . Diverticulosis   . Internal hemorrhoids   . Tubular adenoma of colon   . Plantar fasciitis of left foot   . Stroke     Past Surgical History  Procedure Laterality Date  . Coronary artery bypass graft  07/20/2010     LIMA-LAD; VG-ACUTE MARG of RCA; Seq VG-distal RCA & then pda  . Coronary angioplasty with stent placement  07/01/2011    DES-Resolute to native LCX  . Coronary angioplasty with stent placement  12/01/2007    COMPLEX 5 LESION PCI INCLUDING CUTTING BALLOON AND 4 CYPHER DESs  . Hernia repair  2006  . Knee surgery Right   . Hand surgery Right     to take glass out  . Colonoscopy N/A 02/10/2013    Procedure: COLONOSCOPY;  Surgeon: Ladene Artist, MD;  Location: [facility] ENDOSCOPY;  Service: Endoscopy;  Laterality: N/A;  . Retinal detachment surgery  Right   . Loop recorder implant  02/15/2014    MDT LINQ implanted by Dr Rayann Heman for cryptogenic stroke  . Tee without cardioversion N/A 02/15/2014    Procedure: TRANSESOPHAGEAL ECHOCARDIOGRAM (TEE);  Surgeon: Josue Hector, MD;  Location: [facility] ENDOSCOPY;  Service: Cardiovascular;  Laterality: N/A;  . Left heart catheterization with coronary/graft angiogram  07/01/2011    Procedure: LEFT HEART CATHETERIZATION WITH Beatrix Fetters;  Surgeon: Sanda Klein, MD;  Location: [facility] CATH LAB;  Service: Cardiovascular;;  . Percutaneous coronary stent intervention (pci-s) Right 07/01/2011    Procedure: PERCUTANEOUS CORONARY STENT INTERVENTION (PCI-S);  Surgeon: Sanda Klein, MD;  Location: [facility] CATH LAB;  Service: Cardiovascular;  Laterality: Right;    There were no vitals filed for this visit.  Visit Diagnosis:  Difficulty walking  Left leg weakness      Subjective Assessment - 09/02/14 1409    Subjective Pt denied falls or changes since last visit.    Patient is accompained by: Family member   Pertinent History MI, CABG, DM, CA   Patient Stated Goals walk better and longer distances   Currently in Pain? Yes   Pain Score 2    Pain Location Buttocks   Pain Orientation Right;Left   Pain  Descriptors / Indicators Dull   Pain Type Acute pain   Pain Onset Today   Pain Frequency Intermittent   Aggravating Factors  sitting   Pain Relieving Factors standing                         [facility] Adult PT Treatment/Exercise - 09/02/14 1413    Ambulation/Gait   Ambulation/Gait Yes   Ambulation/Gait Assistance 4: Min guard   Ambulation/Gait Assistance Details L posterior AFO donned with 1/4" heel wedge and L simulated shoe cap. VC's to decrease L genu recurvatum, improve upright posture, and improve stride length. Pt required two seated rest breaks due to fatigue and dizziness. Pt ambulated with [facility] and in //bars.   Ambulation Distance (Feet) --  29'x2, 3x7' in //bars   Assistive  device Large base quad cane   Gait Pattern Step-to pattern;Decreased stance time - left;Decreased step length - right;Left circumduction;Left foot flat;Decreased hip/knee flexion - left;Left genu recurvatum   Ambulation Surface Level;Indoor   Balance   Balance Assessed Yes   Dynamic Standing Balance   Dynamic Standing - Balance Support Right upper extremity supported   Dynamic Standing - Level of Assistance 4: Min assist;Other (comment)  min guard   Dynamic Standing - Balance Activities Lateral lean/weight shifting;Alternating  foot traps   Dynamic Standing - Comments In //bars with B LEs: pt performed lateral weight shifting with tactile and VC's to improve L lat. weight shifting. Pt performed dot taps with B LEs x10/LE, pt required facilitation to improve L knee flexion when tapping dot and manual blocking of L knee during stance. Cues for upright posture and technique. Pt required two seated rest breaks 2/2 fatigue.                PT Education - 09/02/14 1502    Education provided Yes   Education Details PT educated pt on the importance of performing standing HEP at home, in order for body to adjust to upright position, as pt's BP is low per pt. PT also encouraged pt to perform UE flexion and R LE ankle pumps to improve blood flow. PT encouraged pt to call Dr. Garth Bigness office, as [facility] reports they are waitng form a detailed prescription for AFO from Dr. Felecia Shelling.   Person(s) Educated Patient;Spouse   Methods Explanation   Comprehension Verbalized understanding          PT Short Term Goals - 08/23/14 1522    PT SHORT TERM GOAL #1   Title Pt will be independent in HEP to improve strength, balance, endurance, and safety. Target date: 08/16/14.   Status Not Met   PT SHORT TERM GOAL #2   Title Perform BERG and write goals if appropriate. Target date: 08/16/14.  pt was unable to complete full Berg today due to nausea and dizziness symptoms    Status Not Met   PT SHORT TERM GOAL #3    Title Pt will perform sit<>stand with UE assist at MOD I level to improve functional mobilty. Target date: 08/16/14.  pt partially met, requires CGA for transfers and multiple attempts   Status Not Met   PT SHORT TERM GOAL #4   Title Pt will ambulate 200' over even terrain with LRAD and supervision to improve functional mobility. Target date: 08/16/14.   Status Not Met   PT SHORT TERM GOAL #5   Title Pt will perform TUG in </=90 seconds to reduce falls risk. Target date: 08/16/14.   Status  Achieved           PT Long Term Goals - 08/30/14 1240    PT LONG TERM GOAL #1   Title Pt will verbalize understanding of CVA signs/symptoms and risk factors to reduce risk of another CVA. Target date: 09/13/14.   Status On-going   PT LONG TERM GOAL #2   Title Pt will report no falls over the last four weeks to improve safety during functional mobility. Target date; 09/13/14.   Status On-going   PT LONG TERM GOAL #3   Title Pt will ambulate 400' over even/uneven terrain with LRAD at MOD I level to improve functional mobililty. Target date: 09/13/14.   Status On-going   PT LONG TERM GOAL #4   Title Pt's SIS-mobility score will improve by 10% to improve quality of life. Target date: 09/13/14.   Status On-going   PT LONG TERM GOAL #5   Title Pt's gait speed will improve to >/=1.20ft/sec. with LRAD to reduce falls risk. Target date: 09/13/14.   Status On-going   PT LONG TERM GOAL #6   Title Pt will improve BERG score to >/=32/56 to reduce falls risk. Target date: 09/13/14.   Status On-going               Plan - 09/02/14 1504    Clinical Impression Statement Pt demonstrated progress, as he was able to tolerate dynamic balance activities today with facilitation for L knee flexion and blocking L knee during stance. Pt continues to be limited by dizziness and fatigue, as he required frequent seated rest breaks. Continue with POC.   Pt will benefit from skilled therapeutic intervention in order to  improve on the following deficits Difficulty walking;Decreased safety awareness;Decreased endurance;Obesity;Decreased knowledge of use of DME;Decreased balance;Decreased mobility;Decreased strength;Impaired UE functional use;Other (comment)   Rehab Potential Good   PT Frequency 2x / week   PT Duration 8 weeks   PT Treatment/Interventions ADLs/Self Care Home Management;Gait training;Neuromuscular re-education;Biofeedback;Functional mobility training;Patient/family education;Cryotherapy;Therapeutic activities;Wheelchair mobility training;Electrical Stimulation;Therapeutic exercise;Manual techniques;DME Instruction;Balance training;Other (comment)   PT Next Visit Plan dynamic balance (lateral weight shifting), gait training with L posterior AFO, heel wedge, and shoe cap (and LBQC).   PT Home Exercise Plan Strength/balance HEP   Consulted and Agree with Plan of Care Patient;Family member/caregiver        Problem List Patient Active Problem List   Diagnosis Date Noted  . Dysphagia 07/26/2014  . Antiplatelet or antithrombotic long-term use 04/22/2014  . Chest pain 04/17/2014  . SOB (shortness of breath) 04/17/2014  . Bilateral pulmonary embolism 04/11/2014  . Left-sided neglect 03/09/2014  . Dysphagia, pharyngoesophageal phase 03/02/2014  . Arthralgia of left ankle 02/24/2014  . Hemiplegia affecting left dominant side 02/16/2014  . 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 Benign neoplasm of colon 02/10/2013  . Special screening for malignant neoplasms, colon 02/10/2013  . OSA on CPAP 01/06/2013  . S/P angioplasty with DES to CFX 07/01/11 07/02/2011  . Sleep apnea, "cant afford C-pap" 07/02/2011  . CAD, RCA PCI '09, 10/11, CABG X 4 5/12 06/30/2011  . Hypertension, B/P has been low this admission 06/30/2011  . Hyperlipidemia 06/30/2011  . Morbid obesity 06/30/2011  . NSTEMI, 06/29/11 06/30/2011  . 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  Ischemic cardiomyopathy, EF 35-40 2D May 2012 06/30/2011  . CHF, acute, mild 06/30/2011    Yerik Zeringue L 09/02/2014, 3:09 PM  [facility] [address] [place], [place], [address]  Phone: [phone]   Fax:  [phone]     Geoffry Paradise, PT,DPT 09/02/2014 3:09 PM Phone: [phone] Fax: [phone]

## 2014-09-02 NOTE — Therapy (Signed)
[facility] [address] [place], [place], [address] Phone: [phone]   Fax:  [phone]  Occupational Therapy Treatment  Patient Details  Name: Gregory Wiley MRN: [id number] Date of Birth: 05/03/1950 Referring Provider:  Tamsen Roers, MD  Encounter Date: 09/02/2014      OT End of Session - 09/02/14 1414    Visit Number 13   Number of Visits 16   Date for OT Re-Evaluation 09/13/14   Authorization Type [employer]   OT Start Time 1315   OT Stop Time 1400   OT Time Calculation (min) 45 min   Equipment Utilized During Treatment UE Ranger   Activity Tolerance Patient tolerated treatment well   Behavior During Therapy [facility] for tasks assessed/performed      Past Medical History  Diagnosis Date  . Coronary artery disease     stent, then CABG 07/20/10  . MI (myocardial infarction)     NSTEMI- last cath 06/2011-Stent to LCX-DES, last nuc 07/30/11 low risk  . Diabetes mellitus     hx of, diet controlled  . Hypertension   . Hyperlipidemia   . Obesity (BMI 30-39.9)   . OSA on CPAP     since July 2013  . H/O cardiomyopathy     ischemic, now last echo 07/30/11, EF 38%W  . Diverticulosis   . Internal hemorrhoids   . Tubular adenoma of colon   . Plantar fasciitis of left foot   . Stroke     Past Surgical History  Procedure Laterality Date  . Coronary artery bypass graft  07/20/2010     LIMA-LAD; VG-ACUTE MARG of RCA; Seq VG-distal RCA & then pda  . Coronary angioplasty with stent placement  07/01/2011    DES-Resolute to native LCX  . Coronary angioplasty with stent placement  12/01/2007    COMPLEX 5 LESION PCI INCLUDING CUTTING BALLOON AND 4 CYPHER DESs  . Hernia repair  2006  . Knee surgery Right   . Hand surgery Right     to take glass out  . Colonoscopy N/A 02/10/2013    Procedure: COLONOSCOPY;  Surgeon: Ladene Artist, MD;  Location: [facility] ENDOSCOPY;  Service: Endoscopy;  Laterality: N/A;  . Retinal  detachment surgery Right   . Loop recorder implant  02/15/2014    MDT LINQ implanted by Dr Rayann Heman for cryptogenic stroke  . Tee without cardioversion N/A 02/15/2014    Procedure: TRANSESOPHAGEAL ECHOCARDIOGRAM (TEE);  Surgeon: Josue Hector, MD;  Location: [facility] ENDOSCOPY;  Service: Cardiovascular;  Laterality: N/A;  . Left heart catheterization with coronary/graft angiogram  07/01/2011    Procedure: LEFT HEART CATHETERIZATION WITH Beatrix Fetters;  Surgeon: Sanda Klein, MD;  Location: [facility] CATH LAB;  Service: Cardiovascular;;  . Percutaneous coronary stent intervention (pci-s) Right 07/01/2011    Procedure: PERCUTANEOUS CORONARY STENT INTERVENTION (PCI-S);  Surgeon: Sanda Klein, MD;  Location: [facility] CATH LAB;  Service: Cardiovascular;  Laterality: Right;    There were no vitals filed for this visit.  Visit Diagnosis:  Spastic hemiplegia, dominant side      Subjective Assessment - 09/02/14 1357    Subjective  I don't know if I think the brace at nightis helpful   Patient is accompained by: Family member   Pertinent History see epic snapshot, pt with low orthostatic BP, pt with DVT in LLE and PEs - on Zarelto.     Patient Stated Goals walk again, play guitar and sing, wife hopes he gets more use of hand  and can do more of his dressing and bathing   Currently in Pain? Yes   Pain Score 1    Pain Location Head   Pain Orientation Right;Left   Pain Descriptors / Indicators Aching   Pain Type Chronic pain   Pain Onset More than a month ago   Pain Frequency Constant   Aggravating Factors  2pm   Pain Relieving Factors tylenol   Multiple Pain Sites No                      OT Treatments/Exercises (OP) - 09/02/14 0001    Neurological Re-education Exercises   Other Exercises 1 Neuromuscular reeducation to address isolated muscle control in shoulder and elbow.  Patient with ability to isolate shoulder extension, abduction, adduction, and internal rotation.  Patient has  ability to iolate bicep activation to flex elbow, yet has difficulty to stop contraction to allow elbow passive extension.  ttempted elbow control in supine, dependent position, and with gravity, to reduce demand on elbow.    Other Exercises 2 Worked on transitioning from supine to sidelying to address joint mobility in left shoulder, in increasing ranges of shoulder flexion.     Other Weight-Bearing Exercises 1 Very light weight bearing in sidelying to increase trunk activation.  Patient can briefly activate oblique muscles, however has difficulty sustaining activation.                  OT Education - 09/02/14 1414    Education provided Yes   Education Details relaxation to bicep muscle after activation   Person(s) Educated Patient;Spouse   Methods Explanation;Demonstration;Verbal cues;Tactile cues   Comprehension Verbalized understanding;Tactile cues required;Verbal cues required          OT Short Term Goals - 08/30/14 1233    OT SHORT TERM GOAL #1   Title Pt and wife will be mod I with HEP - 08/16/2014   Status Achieved   OT SHORT TERM GOAL #2   Title Pt will report no more then 3/10 pain with PROM of shoulder flexion to at least 90* to assist with self care.   Baseline able to acheive 85* with pain 3/10 or less   Status Partially Met   OT SHORT TERM GOAL #3   Title Pt will require no more than moderate assist for LB dressing   Baseline max A shoes,/ socks, mod Afor shorts   Status Partially Met   OT SHORT TERM GOAL #4   Title Pt will require supervision for UB dressing, mod vc's   Status Achieved   OT SHORT TERM GOAL #5   Title Pt will require no more than min a for toilet transfers   Status Achieved   OT SHORT TERM GOAL #6   Title Pt will require min a to hike pants with toileting   Baseline per pt report   Status Achieved           OT Long Term Goals - 08/30/14 1233    OT LONG TERM GOAL #1   Title Pt and wife will be mod I with upgraded HEP PRN - 09/13/2014    Status On-going   OT LONG TERM GOAL #2   Title Pt will be min a with LB dressing   Baseline --  with AE equipment   Status Achieved   OT LONG TERM GOAL #3   Title Pt will be supervision UB bathing   Status Achieved   OT LONG TERM GOAL #4  Title Pt will be mod a LB bathing   Status Achieved  min a with equipment   OT LONG TERM GOAL #5   Title Pt will be min a for transfer tub bench into shower   Status Achieved   OT LONG TERM GOAL #6   Title Pt will require min a for simple hot meal prep at wheelchair level , standing at counter intermittently with close S   Status On-going   OT LONG TERM GOAL #7   Title Pt will recall 2 strategies for adapted bathing/dressing   Status Achieved   OT LONG TERM GOAL #8   Title Pt will tolerate 20 minutes of functional activity without rest breaks   Status On-going               Plan - 09/02/14 1415    Clinical Impression Statement Patient showing improved tolerance for therapy and this is helpful in progression toward OT goals   Pt will benefit from skilled therapeutic intervention in order to improve on the following deficits (Retired) Decreased activity tolerance;Decreased balance;Decreased endurance;Decreased cognition;Decreased knowledge of use of DME;Decreased mobility;Decreased range of motion;Decreased safety awareness;Decreased strength;Impaired UE functional use;Impaired tone;Pain;Impaired flexibility   Rehab Potential Good   Clinical Impairments Affecting Rehab Potential Pt will benefit from skilled OT to address the above deficits.   OT Frequency 2x / week   OT Duration 8 weeks   OT Treatment/Interventions Self-care/ADL training;Moist Heat;Electrical Stimulation;DME and/or AE instruction;Neuromuscular education;Therapeutic exercise;Functional Mobility Training;Manual Therapy;Passive range of motion;Splinting;Therapeutic activities;Balance training;Patient/family education;Cognitive remediation/compensation   Plan check to see if  patient made breakfast   Consulted and Agree with Plan of Care Patient;Family member/caregiver   Family Member Consulted wife        Problem List Patient Active Problem List   Diagnosis Date Noted  . Dysphagia 07/26/2014  . Antiplatelet or antithrombotic long-term use 04/22/2014  . Chest pain 04/17/2014  . SOB (shortness of breath) 04/17/2014  . Bilateral pulmonary embolism 04/11/2014  . Left-sided neglect 03/09/2014  . Dysphagia, pharyngoesophageal phase 03/02/2014  . Arthralgia of left ankle 02/24/2014  . Hemiplegia affecting left dominant side 02/16/2014  . 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 Benign neoplasm of colon 02/10/2013  . Special screening for malignant neoplasms, colon 02/10/2013  . OSA on CPAP 01/06/2013  . S/P angioplasty with DES to CFX 07/01/11 07/02/2011  . Sleep apnea, "cant afford C-pap" 07/02/2011  . CAD, RCA PCI '09, 10/11, CABG X 4 5/12 06/30/2011  . Hypertension, B/P has been low this admission 06/30/2011  . Hyperlipidemia 06/30/2011  . Morbid obesity 06/30/2011  . NSTEMI, 06/29/11 06/30/2011  . 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 Ischemic cardiomyopathy, EF 35-40 2D May 2012 06/30/2011  . CHF, acute, mild 06/30/2011    Mariah Milling, OTR/L 09/02/2014, 2:17 PM  [facility] [address] [place], [place], [address] Phone: [phone]   Fax:  [phone]

## 2014-09-05 ENCOUNTER — Ambulatory Visit: Payer: 59 | Admitting: Occupational Therapy

## 2014-09-05 ENCOUNTER — Ambulatory Visit: Payer: 59 | Admitting: Physical Therapy

## 2014-09-05 ENCOUNTER — Encounter: Payer: Self-pay | Admitting: Occupational Therapy

## 2014-09-05 ENCOUNTER — Encounter: Payer: Self-pay | Admitting: Physical Therapy

## 2014-09-05 VITALS — BP 93/69 | HR 78

## 2014-09-05 DIAGNOSIS — R262 Difficulty in walking, not elsewhere classified: Secondary | ICD-10-CM | POA: Diagnosis not present

## 2014-09-05 DIAGNOSIS — Z7409 Other reduced mobility: Secondary | ICD-10-CM

## 2014-09-05 DIAGNOSIS — G8110 Spastic hemiplegia affecting unspecified side: Secondary | ICD-10-CM

## 2014-09-05 DIAGNOSIS — R29898 Other symptoms and signs involving the musculoskeletal system: Secondary | ICD-10-CM

## 2014-09-05 DIAGNOSIS — R531 Weakness: Secondary | ICD-10-CM

## 2014-09-05 NOTE — Therapy (Signed)
[facility] [address] [place], [place], [address] Phone: [phone]   Fax:  [phone]  Occupational Therapy Treatment  Patient Details  Name: Gregory Wiley MRN: [id number] Date of Birth: [birth year] Referring Provider:  Tamsen Roers, MD  Encounter Date: 09/05/2014      OT End of Session - 09/05/14 1542    Visit Number 14   Number of Visits 16   Date for OT Re-Evaluation 09/13/14   Authorization Type [employer]   OT Start Time 1446   OT Stop Time 1529   OT Time Calculation (min) 43 min   Activity Tolerance Patient tolerated treatment well      Past Medical History  Diagnosis Date  . Coronary artery disease     stent, then CABG 07/20/10  . MI (myocardial infarction)     NSTEMI- last cath 06/2011-Stent to LCX-DES, last nuc 07/30/11 low risk  . Diabetes mellitus     hx of, diet controlled  . Hypertension   . Hyperlipidemia   . Obesity (BMI 30-39.9)   . OSA on CPAP     since July 2013  . H/O cardiomyopathy     ischemic, now last echo 07/30/11, EF 38%W  . Diverticulosis   . Internal hemorrhoids   . Tubular adenoma of colon   . Plantar fasciitis of left foot   . Stroke     Past Surgical History  Procedure Laterality Date  . Coronary artery bypass graft  07/20/2010     LIMA-LAD; VG-ACUTE MARG of RCA; Seq VG-distal RCA & then pda  . Coronary angioplasty with stent placement  07/01/2011    DES-Resolute to native LCX  . Coronary angioplasty with stent placement  12/01/2007    COMPLEX 5 LESION PCI INCLUDING CUTTING BALLOON AND 4 CYPHER DESs  . Hernia repair  2006  . Knee surgery Right   . Hand surgery Right     to take glass out  . Colonoscopy N/A 02/10/2013    Procedure: COLONOSCOPY;  Surgeon: Ladene Artist, MD;  Location: [facility] ENDOSCOPY;  Service: Endoscopy;  Laterality: N/A;  . Retinal detachment surgery Right   . Loop recorder implant  02/15/2014    MDT LINQ implanted by Dr Rayann Heman for cryptogenic  stroke  . Tee without cardioversion N/A 02/15/2014    Procedure: TRANSESOPHAGEAL ECHOCARDIOGRAM (TEE);  Surgeon: Josue Hector, MD;  Location: [facility] ENDOSCOPY;  Service: Cardiovascular;  Laterality: N/A;  . Left heart catheterization with coronary/graft angiogram  07/01/2011    Procedure: LEFT HEART CATHETERIZATION WITH Beatrix Fetters;  Surgeon: Sanda Klein, MD;  Location: [facility] CATH LAB;  Service: Cardiovascular;;  . Percutaneous coronary stent intervention (pci-s) Right 07/01/2011    Procedure: PERCUTANEOUS CORONARY STENT INTERVENTION (PCI-S);  Surgeon: Sanda Klein, MD;  Location: [facility] CATH LAB;  Service: Cardiovascular;  Laterality: Right;    There were no vitals filed for this visit.  Visit Diagnosis:  Spastic hemiplegia, dominant side  Generalized weakness  Impaired functional mobility and activity tolerance      Subjective Assessment - 09/05/14 1530    Subjective  I have had better days   Patient is accompained by: Family member   Pertinent History see epic snapshot, pt with low orthostatic BP, pt with DVT in LLE and PEs - on Zarelto.     Patient Stated Goals walk again, play guitar and sing, wife hopes he gets more use of hand and can do more of his dressing and bathing   Currently in  Pain? Yes  just my daily 2:00 headache   Pain Score 9    Pain Location Head   Pain Orientation Right;Left   Pain Descriptors / Indicators Aching;Sharp   Pain Type Acute pain   Pain Onset More than a month ago   Pain Frequency Constant   Aggravating Factors  loud noises and activity   Pain Relieving Factors sleeping                      OT Treatments/Exercises (OP) - 09/05/14 0001    ADLs   Cooking With encouragement pt prepared scrambled eggs in standing with min a to manipulate objects and contact guard and mod vc's for functional ambulation.  Pt able to stand at counter with close supervision with cues to face activity and incorporate LLE to increase safety.  Pt able  to participate for 25 minutes of activity with three rest breaks. Pt tolerated 7 minutes max for activity without rest break and required max vc's for encouragement. Discussed with pt and wife importance of getting up on his feet at home  frequently with wife's assistance in order to build activity tolerance. Pt and wife verbalized understanding. Pt has not yet made a meal with wife at home stating "we haven't gone to the store yet"                  OT Short Term Goals - 09/05/14 1538    OT SHORT TERM GOAL #1   Title Pt and wife will be mod I with HEP - 08/16/2014   Status Achieved   OT SHORT TERM GOAL #2   Title Pt will report no more then 3/10 pain with PROM of shoulder flexion to at least 90* to assist with self care.   Baseline able to acheive 85* with pain 3/10 or less   Status Partially Met   OT SHORT TERM GOAL #3   Title Pt will require no more than moderate assist for LB dressing   Baseline max A shoes,/ socks, mod Afor shorts   Status Partially Met   OT SHORT TERM GOAL #4   Title Pt will require supervision for UB dressing, mod vc's   Status Achieved   OT SHORT TERM GOAL #5   Title Pt will require no more than min a for toilet transfers   Status Achieved   OT SHORT TERM GOAL #6   Title Pt will require min a to hike pants with toileting   Baseline per pt report   Status Achieved           OT Long Term Goals - 09/05/14 1538    OT LONG TERM GOAL #1   Title Pt and wife will be mod I with upgraded HEP PRN - 09/13/2014   Status On-going   OT LONG TERM GOAL #2   Title Pt will be min a with LB dressing   Baseline --  with AE equipment   Status Achieved   OT LONG TERM GOAL #3   Title Pt will be supervision UB bathing   Status Achieved   OT LONG TERM GOAL #4   Title Pt will be mod a LB bathing   Status Achieved  min a with equipment   OT LONG TERM GOAL #5   Title Pt will be min a for transfer tub bench into shower   Status Achieved   OT LONG TERM GOAL #6    Title Pt will require min a for  simple hot meal prep at wheelchair level , standing at counter intermittently with close S   Status Achieved   OT LONG TERM GOAL #7   Title Pt will recall 2 strategies for adapted bathing/dressing   Status Achieved   OT LONG TERM GOAL #8   Title Pt will tolerate 20 minutes of functional activity without rest breaks   Status On-going               Plan - 09/05/14 1540    Clinical Impression Statement Pt continues to progress toward goals with max encouragment for participation. Pt c/o daily of headaches and not feeling well - again encouraged pt to call MD. Pt states he has an appointment on Friday and will talk to MD then.  Also provided pt and wife phone number for rehab MD since their office has not yet called and encouraged them to call asap. Pt and wife verbalized understanding.   Pt will benefit from skilled therapeutic intervention in order to improve on the following deficits (Retired) Decreased activity tolerance;Decreased balance;Decreased endurance;Decreased cognition;Decreased knowledge of use of DME;Decreased mobility;Decreased range of motion;Decreased safety awareness;Decreased strength;Impaired UE functional use;Impaired tone;Pain;Impaired flexibility   Rehab Potential Good   Clinical Impairments Affecting Rehab Potential Pt will benefit from skilled OT to address the above deficits.   OT Frequency 2x / week   OT Duration 8 weeks   OT Treatment/Interventions Self-care/ADL training;Moist Heat;Electrical Stimulation;DME and/or AE instruction;Neuromuscular education;Therapeutic exercise;Functional Mobility Training;Manual Therapy;Passive range of motion;Splinting;Therapeutic activities;Balance training;Patient/family education;Cognitive remediation/compensation   Plan activity tolerance, work toward finalizing HEP   Consulted and Agree with Plan of Care Patient;Family member/caregiver   Family Member Consulted wife        Problem  List Patient Active Problem List   Diagnosis Date Noted  . Dysphagia 07/26/2014  . Antiplatelet or antithrombotic long-term use 04/22/2014  . Chest pain 04/17/2014  . SOB (shortness of breath) 04/17/2014  . Bilateral pulmonary embolism 04/11/2014  . Left-sided neglect 03/09/2014  . Dysphagia, pharyngoesophageal phase 03/02/2014  . Arthralgia of left ankle 02/24/2014  . Hemiplegia affecting left dominant side 02/16/2014  . 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 Benign neoplasm of colon 02/10/2013  . Special screening for malignant neoplasms, colon 02/10/2013  . OSA on CPAP 01/06/2013  . S/P angioplasty with DES to CFX 07/01/11 07/02/2011  . Sleep apnea, "cant afford C-pap" 07/02/2011  . CAD, RCA PCI '09, 10/11, CABG X 4 5/12 06/30/2011  . Hypertension, B/P has been low this admission 06/30/2011  . Hyperlipidemia 06/30/2011  . Morbid obesity 06/30/2011  . NSTEMI, 06/29/11 06/30/2011  . 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 Ischemic cardiomyopathy, EF 35-40 2D May 2012 06/30/2011  . CHF, acute, mild 06/30/2011    Quay Burow, OTR/L 09/05/2014, 3:44 PM  [facility] [address] [place], [place], [address] Phone: [phone]   Fax:  [phone]

## 2014-09-05 NOTE — Therapy (Signed)
[facility] [address] [place], [place], [address] Phone: [phone]   Fax:  [phone]  Physical Therapy Treatment  Patient Details  Name: Gregory Wiley MRN: [id number] Date of Birth: 09/12/1950 Referring Provider:  Tamsen Roers, MD  Encounter Date: 09/05/2014      PT End of Session - 09/05/14 1412    Visit Number 13   Number of Visits 17   Date for PT Re-Evaluation 09/17/14   Authorization Type U[employer], Medicare will be active 11/17/14.   PT Start Time 1404   PT Stop Time 1445   PT Time Calculation (min) 41 min   Equipment Utilized During Treatment Gait belt   Activity Tolerance Patient limited by fatigue   Behavior During Therapy Flat affect      Past Medical History  Diagnosis Date  . Coronary artery disease     stent, then CABG 07/20/10  . MI (myocardial infarction)     NSTEMI- last cath 06/2011-Stent to LCX-DES, last nuc 07/30/11 low risk  . Diabetes mellitus     hx of, diet controlled  . Hypertension   . Hyperlipidemia   . Obesity (BMI 30-39.9)   . OSA on CPAP     since July 2013  . H/O cardiomyopathy     ischemic, now last echo 07/30/11, EF 38%W  . Diverticulosis   . Internal hemorrhoids   . Tubular adenoma of colon   . Plantar fasciitis of left foot   . Stroke     Past Surgical History  Procedure Laterality Date  . Coronary artery bypass graft  07/20/2010     LIMA-LAD; VG-ACUTE MARG of RCA; Seq VG-distal RCA & then pda  . Coronary angioplasty with stent placement  07/01/2011    DES-Resolute to native LCX  . Coronary angioplasty with stent placement  12/01/2007    COMPLEX 5 LESION PCI INCLUDING CUTTING BALLOON AND 4 CYPHER DESs  . Hernia repair  2006  . Knee surgery Right   . Hand surgery Right     to take glass out  . Colonoscopy N/A 02/10/2013    Procedure: COLONOSCOPY;  Surgeon: Ladene Artist, MD;  Location: [facility];  Service: Endoscopy;  Laterality: N/A;  . Retinal detachment surgery  Right   . Loop recorder implant  02/15/2014    MDT LINQ implanted by Dr Rayann Heman for cryptogenic stroke  . Tee without cardioversion N/A 02/15/2014    Procedure: TRANSESOPHAGEAL ECHOCARDIOGRAM (TEE);  Surgeon: Josue Hector, MD;  Location: [facility] ENDOSCOPY;  Service: Cardiovascular;  Laterality: N/A;  . Left heart catheterization with coronary/graft angiogram  07/01/2011    Procedure: LEFT HEART CATHETERIZATION WITH Beatrix Fetters;  Surgeon: Sanda Klein, MD;  Location: [facility] CATH LAB;  Service: Cardiovascular;;  . Percutaneous coronary stent intervention (pci-s) Right 07/01/2011    Procedure: PERCUTANEOUS CORONARY STENT INTERVENTION (PCI-S);  Surgeon: Sanda Klein, MD;  Location: [facility] LAB;  Service: Cardiovascular;  Laterality: Right;    Filed Vitals:   09/05/14 1409  BP: 93/69  Pulse: 78    Visit Diagnosis:  Difficulty walking  Left leg weakness  Impaired functional mobility and activity tolerance      Subjective Assessment - 09/05/14 1409    Currently in Pain? Yes   Pain Score 9    Pain Location Head   Pain Descriptors / Indicators Aching;Sharp   Pain Type Acute pain   Pain Onset More than a month ago   Pain Frequency Constant  doesn't feel it when asleep  Aggravating Factors  loud noises   Pain Relieving Factors sleeping          [facility] Adult PT Treatment/Exercise - 09/05/14 1431    Transfers   Sit to Stand 4: Min guard;4: Min assist;With upper extremity assist;From chair/3-in-1;From bed;With armrests   Stand to Sit 4: Min guard;With upper extremity assist;To chair/3-in-1   Ambulation/Gait   Ambulation/Gait Yes   Ambulation/Gait Assistance 4: Min guard   Ambulation/Gait Assistance Details used left posterior ottobock brace, 1/4 inch heel wedge on left and simulated toe cap with all gait. Pt with left knee recurvatum each step despite cues for need control and facilitaiton to prevent hyperextension. ? trialing an anterior brace for better knee control with gait.                          Ambulation Distance (Feet) 50 Feet  x1, 70 x1, 35 x 1   Assistive device Large base quad cane   Gait Pattern Step-to pattern;Decreased stance time - left;Decreased step length - right;Left circumduction;Left foot flat;Decreased hip/knee flexion - left;Left genu recurvatum   Ambulation Surface Level;Indoor            PT Short Term Goals - 08/23/14 1522    PT SHORT TERM GOAL #1   Title Pt will be independent in HEP to improve strength, balance, endurance, and safety. Target date: 08/16/14.   Status Not Met   PT SHORT TERM GOAL #2   Title Perform BERG and write goals if appropriate. Target date: 08/16/14.  pt was unable to complete full Berg today due to nausea and dizziness symptoms    Status Not Met   PT SHORT TERM GOAL #3   Title Pt will perform sit<>stand with UE assist at MOD I level to improve functional mobilty. Target date: 08/16/14.  pt partially met, requires CGA for transfers and multiple attempts   Status Not Met   PT SHORT TERM GOAL #4   Title Pt will ambulate 200' over even terrain with LRAD and supervision to improve functional mobility. Target date: 08/16/14.   Status Not Met   PT SHORT TERM GOAL #5   Title Pt will perform TUG in </=90 seconds to reduce falls risk. Target date: 08/16/14.   Status Achieved           PT Long Term Goals - 08/30/14 1240    PT LONG TERM GOAL #1   Title Pt will verbalize understanding of CVA signs/symptoms and risk factors to reduce risk of another CVA. Target date: 09/13/14.   Status On-going   PT LONG TERM GOAL #2   Title Pt will report no falls over the last four weeks to improve safety during functional mobility. Target date; 09/13/14.   Status On-going   PT LONG TERM GOAL #3   Title Pt will ambulate 400' over even/uneven terrain with LRAD at MOD I level to improve functional mobililty. Target date: 09/13/14.   Status On-going   PT LONG TERM GOAL #4   Title Pt's SIS-mobility score will improve by 10% to  improve quality of life. Target date: 09/13/14.   Status On-going   PT LONG TERM GOAL #5   Title Pt's gait speed will improve to >/=1.16ft/sec. with LRAD to reduce falls risk. Target date: 09/13/14.   Status On-going   PT LONG TERM GOAL #6   Title Pt will improve BERG score to >/=32/56 to reduce falls risk. Target date: 09/13/14.   Status On-going  Plan - 09/05/14 1413    Clinical Impression Statement Continued to work on gait today. Pt with left knee recurvatum with every step despite tactile cues to decreased knee recurvatum and use of heel wedge. May need to consider different brace to achieve better knee control with gait. Pt with nausea on 3rd gait trial and not able to walk anymore today. No vomiting and nausea resided quickly. Pt making progress toward goals.                                        Pt will benefit from skilled therapeutic intervention in order to improve on the following deficits Difficulty walking;Decreased safety awareness;Decreased endurance;Obesity;Decreased knowledge of use of DME;Decreased balance;Decreased mobility;Decreased strength;Impaired UE functional use;Other (comment)   Rehab Potential Good   PT Frequency 2x / week   PT Duration 8 weeks   PT Treatment/Interventions ADLs/Self Care Home Management;Gait training;Neuromuscular re-education;Biofeedback;Functional mobility training;Patient/family education;Cryotherapy;Therapeutic activities;Wheelchair mobility training;Electrical Stimulation;Therapeutic exercise;Manual techniques;DME Instruction;Balance training;Other (comment)   PT Next Visit Plan begin checking goals: LTG date is 09/13/14, plan of care ends on date 09/17/14; gait with brace, heel wedge and simulated shoe cap.   PT Home Exercise Plan Strength/balance HEP   Consulted and Agree with Plan of Care Patient;Family member/caregiver       Problem List Patient Active Problem List   Diagnosis Date Noted  . Dysphagia 07/26/2014  . Antiplatelet  or antithrombotic long-term use 04/22/2014  . Chest pain 04/17/2014  . SOB (shortness of breath) 04/17/2014  . Bilateral pulmonary embolism 04/11/2014  . Left-sided neglect 03/09/2014  . Dysphagia, pharyngoesophageal phase 03/02/2014  . Arthralgia of left ankle 02/24/2014  . Hemiplegia affecting left dominant side 02/16/2014  . 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 Benign neoplasm of colon 02/10/2013  . Special screening for malignant neoplasms, colon 02/10/2013  . OSA on CPAP 01/06/2013  . S/P angioplasty with DES to CFX 07/01/11 07/02/2011  . Sleep apnea, "cant afford C-pap" 07/02/2011  . CAD, RCA PCI '09, 10/11, CABG X 4 5/12 06/30/2011  . Hypertension, B/P has been low this admission 06/30/2011  . Hyperlipidemia 06/30/2011  . Morbid obesity 06/30/2011  . NSTEMI, 06/29/11 06/30/2011  . 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 Ischemic cardiomyopathy, EF 35-40 2D May 2012 06/30/2011  . CHF, acute, mild 06/30/2011    Willow Ora 09/05/2014, 6:35 PM  Willow Ora, PTA, [facility] [address] [place], [place] [address] [phone] 09/05/2014, 6:36 PM

## 2014-09-06 ENCOUNTER — Encounter: Payer: Self-pay | Admitting: Occupational Therapy

## 2014-09-06 ENCOUNTER — Ambulatory Visit: Payer: 59 | Admitting: Occupational Therapy

## 2014-09-06 ENCOUNTER — Telehealth: Payer: Self-pay | Admitting: Neurology

## 2014-09-06 ENCOUNTER — Ambulatory Visit: Payer: 59

## 2014-09-06 DIAGNOSIS — R531 Weakness: Secondary | ICD-10-CM

## 2014-09-06 DIAGNOSIS — R262 Difficulty in walking, not elsewhere classified: Secondary | ICD-10-CM | POA: Diagnosis not present

## 2014-09-06 DIAGNOSIS — Z7409 Other reduced mobility: Secondary | ICD-10-CM

## 2014-09-06 DIAGNOSIS — R29898 Other symptoms and signs involving the musculoskeletal system: Secondary | ICD-10-CM

## 2014-09-06 DIAGNOSIS — G8110 Spastic hemiplegia affecting unspecified side: Secondary | ICD-10-CM

## 2014-09-06 NOTE — Therapy (Signed)
[facility] [address] [place], [place], [address] Phone: [phone]   Fax:  [phone]  Occupational Therapy Treatment  Patient Details  Name: Gregory Wiley MRN: [id number] Date of Birth: Jun 19, 1950 Referring Provider:  Tamsen Roers, MD  Encounter Date: 09/06/2014      OT End of Session - 09/06/14 1258    Visit Number 15   Number of Visits 16   Date for OT Re-Evaluation 09/13/14   Authorization Type [employer]   OT Start Time 1100   OT Stop Time 1142   OT Time Calculation (min) 42 min   Activity Tolerance Patient tolerated treatment well      Past Medical History  Diagnosis Date  . Coronary artery disease     stent, then CABG 07/20/10  . MI (myocardial infarction)     NSTEMI- last cath 06/2011-Stent to LCX-DES, last nuc 07/30/11 low risk  . Diabetes mellitus     hx of, diet controlled  . Hypertension   . Hyperlipidemia   . Obesity (BMI 30-39.9)   . OSA on CPAP     since July 2013  . H/O cardiomyopathy     ischemic, now last echo 07/30/11, EF 38%W  . Diverticulosis   . Internal hemorrhoids   . Tubular adenoma of colon   . Plantar fasciitis of left foot   . Stroke     Past Surgical History  Procedure Laterality Date  . Coronary artery bypass graft  07/20/2010     LIMA-LAD; VG-ACUTE MARG of RCA; Seq VG-distal RCA & then pda  . Coronary angioplasty with stent placement  07/01/2011    DES-Resolute to native LCX  . Coronary angioplasty with stent placement  12/01/2007    COMPLEX 5 LESION PCI INCLUDING CUTTING BALLOON AND 4 CYPHER DESs  . Hernia repair  2006  . Knee surgery Right   . Hand surgery Right     to take glass out  . Colonoscopy N/A 02/10/2013    Procedure: COLONOSCOPY;  Surgeon: Ladene Artist, MD;  Location: [facility] ENDOSCOPY;  Service: Endoscopy;  Laterality: N/A;  . Retinal detachment surgery Right   . Loop recorder implant  02/15/2014    MDT LINQ implanted by Dr Rayann Heman for cryptogenic  stroke  . Tee without cardioversion N/A 02/15/2014    Procedure: TRANSESOPHAGEAL ECHOCARDIOGRAM (TEE);  Surgeon: Josue Hector, MD;  Location: [facility] ENDOSCOPY;  Service: Cardiovascular;  Laterality: N/A;  . Left heart catheterization with coronary/graft angiogram  07/01/2011    Procedure: LEFT HEART CATHETERIZATION WITH Beatrix Fetters;  Surgeon: Sanda Klein, MD;  Location: [facility] CATH LAB;  Service: Cardiovascular;;  . Percutaneous coronary stent intervention (pci-s) Right 07/01/2011    Procedure: PERCUTANEOUS CORONARY STENT INTERVENTION (PCI-S);  Surgeon: Sanda Klein, MD;  Location: [facility] CATH LAB;  Service: Cardiovascular;  Laterality: Right;    There were no vitals filed for this visit.  Visit Diagnosis:  Spastic hemiplegia, dominant side  Generalized weakness  Impaired functional mobility and activity tolerance      Subjective Assessment - 09/06/14 1103    Subjective  I feel like death warmed over I should have stayed home   Patient is accompained by: Family member   Pertinent History see epic snapshot, pt with low orthostatic BP, pt with DVT in LLE and PEs - on Zarelto.     Patient Stated Goals walk again, play guitar and sing, wife hopes he gets more use of hand and can do more of his dressing  and bathing   Currently in Pain? Yes   Pain Score 9    Pain Location Head   Pain Orientation Right  and both temples   Pain Descriptors / Indicators Aching;Sharp   Pain Type Acute pain   Pain Onset More than a month ago   Pain Frequency Constant   Aggravating Factors  eveyrthing makes it worse   Pain Relieving Factors sleeping.                      OT Treatments/Exercises (OP) - 09/06/14 0001    ADLs   ADL Comments Treatment focused on improving activity tolerance, sit to stand, dynamic standing balance, functional ambulation, alignment, incorporating LLE into activity,  and safety - pt requires max encouragement to participate. Pt required multiple rest breaks  able to tolerate approximately 6-7 minutes of activity at a time.                   OT Short Term Goals - 09/06/14 1255    OT SHORT TERM GOAL #1   Title Pt and wife will be mod I with HEP - 08/16/2014   Status Achieved   OT SHORT TERM GOAL #2   Title Pt will report no more then 3/10 pain with PROM of shoulder flexion to at least 90* to assist with self care.   Baseline able to acheive 85* with pain 3/10 or less   Status Partially Met   OT SHORT TERM GOAL #3   Title Pt will require no more than moderate assist for LB dressing   Baseline max A shoes,/ socks, mod Afor shorts   Status Partially Met   OT SHORT TERM GOAL #4   Title Pt will require supervision for UB dressing, mod vc's   Status Achieved   OT SHORT TERM GOAL #5   Title Pt will require no more than min a for toilet transfers   Status Achieved   OT SHORT TERM GOAL #6   Title Pt will require min a to hike pants with toileting   Baseline per pt report   Status Achieved           OT Long Term Goals - 09/06/14 1256    OT LONG TERM GOAL #1   Title Pt and wife will be mod I with upgraded HEP PRN - 09/13/2014   Status Achieved   OT LONG TERM GOAL #2   Title Pt will be min a with LB dressing   Baseline --  with AE equipment   Status Achieved   OT LONG TERM GOAL #3   Title Pt will be supervision UB bathing   Status Achieved   OT LONG TERM GOAL #4   Title Pt will be mod a LB bathing   Status Achieved  min a with equipment   OT LONG TERM GOAL #5   Title Pt will be min a for transfer tub bench into shower   Status Achieved   OT LONG TERM GOAL #6   Title Pt will require min a for simple hot meal prep at wheelchair level , standing at counter intermittently with close S   Status Achieved   OT LONG TERM GOAL #7   Title Pt will recall 2 strategies for adapted bathing/dressing   Status Achieved   OT LONG TERM GOAL #8   Title Pt will tolerate 20 minutes of functional activity without rest breaks   Status  On-going  Plan - 09/06/14 1256    Clinical Impression Statement Pt with slow improvement however often states he does not feel well and requires max encouragement to participate fully in sessions. Pt requires repeated and frequent vc's for safety and to incorporate L side into activities.    Pt will benefit from skilled therapeutic intervention in order to improve on the following deficits (Retired) Decreased activity tolerance;Decreased balance;Decreased endurance;Decreased cognition;Decreased knowledge of use of DME;Decreased mobility;Decreased range of motion;Decreased safety awareness;Decreased strength;Impaired UE functional use;Impaired tone;Pain;Impaired flexibility   Rehab Potential Good   Clinical Impairments Affecting Rehab Potential Pt will benefit from skilled OT to address the above deficits.   OT Frequency 2x / week   OT Duration 8 weeks   OT Treatment/Interventions Self-care/ADL training;Moist Heat;Electrical Stimulation;DME and/or AE instruction;Neuromuscular education;Therapeutic exercise;Functional Mobility Training;Manual Therapy;Passive range of motion;Splinting;Therapeutic activities;Balance training;Patient/family education;Cognitive remediation/compensation   Plan activity tolerance, incorporation of L side, safety, d/c from OT after next session   Consulted and Agree with Plan of Care Patient;Family member/caregiver   Family Member Consulted wife        Problem List Patient Active Problem List   Diagnosis Date Noted  . Dysphagia 07/26/2014  . Antiplatelet or antithrombotic long-term use 04/22/2014  . Chest pain 04/17/2014  . SOB (shortness of breath) 04/17/2014  . Bilateral pulmonary embolism 04/11/2014  . Left-sided neglect 03/09/2014  . Dysphagia, pharyngoesophageal phase 03/02/2014  . Arthralgia of left ankle 02/24/2014  . Hemiplegia affecting left dominant side 02/16/2014  . 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 Benign neoplasm of colon 02/10/2013  . Special screening for malignant neoplasms, colon 02/10/2013  . OSA on CPAP 01/06/2013  . S/P angioplasty with DES to CFX 07/01/11 07/02/2011  . Sleep apnea, "cant afford C-pap" 07/02/2011  . CAD, RCA PCI '09, 10/11, CABG X 4 5/12 06/30/2011  . Hypertension, B/P has been low this admission 06/30/2011  . Hyperlipidemia 06/30/2011  . Morbid obesity 06/30/2011  . NSTEMI, 06/29/11 06/30/2011  . 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 Ischemic cardiomyopathy, EF 35-40 2D May 2012 06/30/2011  . CHF, acute, mild 06/30/2011    Quay Burow, OTR/L 09/06/2014, 1:03 PM  [facility] [address] [place], [place], [address] Phone: [phone]   Fax:  [phone]

## 2014-09-06 NOTE — Telephone Encounter (Addendum)
Sameeka with [facility] is calling in regard to [employer] dob [birth year] and states they received a fax today with his chart notes but is missing the detailed prescription.  "detailed written order" which doctor needed to sign off on. please call her back @[phone].  Thanks!

## 2014-09-06 NOTE — Therapy (Signed)
[facility] [address] [place], [place], [address] Phone: [phone]   Fax:  [phone]  Physical Therapy Treatment  Patient Details  Name: Gregory Wiley MRN: [id number] Date of Birth: Jan 22, 1951 Referring Provider:  Tamsen Roers, MD  Encounter Date: 09/06/2014      PT End of Session - 09/06/14 1242    Visit Number 14   Number of Visits 17   Date for PT Re-Evaluation 09/17/14   Authorization Type UHC, Medicare will be active 11/17/14.   PT Start Time 1146   PT Stop Time 1217   PT Time Calculation (min) 31 min   Equipment Utilized During Treatment Gait belt   Activity Tolerance Patient limited by pain   Behavior During Therapy Flat affect      Past Medical History  Diagnosis Date  . Coronary artery disease     stent, then CABG 07/20/10  . MI (myocardial infarction)     NSTEMI- last cath 06/2011-Stent to LCX-DES, last nuc 07/30/11 low risk  . Diabetes mellitus     hx of, diet controlled  . Hypertension   . Hyperlipidemia   . Obesity (BMI 30-39.9)   . OSA on CPAP     since July 2013  . H/O cardiomyopathy     ischemic, now last echo 07/30/11, EF 38%W  . Diverticulosis   . Internal hemorrhoids   . Tubular adenoma of colon   . Plantar fasciitis of left foot   . Stroke     Past Surgical History  Procedure Laterality Date  . Coronary artery bypass graft  07/20/2010     LIMA-LAD; VG-ACUTE MARG of RCA; Seq VG-distal RCA & then pda  . Coronary angioplasty with stent placement  07/01/2011    DES-Resolute to native LCX  . Coronary angioplasty with stent placement  12/01/2007    COMPLEX 5 LESION PCI INCLUDING CUTTING BALLOON AND 4 CYPHER DESs  . Hernia repair  2006  . Knee surgery Right   . Hand surgery Right     to take glass out  . Colonoscopy N/A 02/10/2013    Procedure: COLONOSCOPY;  Surgeon: Ladene Artist, MD;  Location: [facility] ENDOSCOPY;  Service: Endoscopy;  Laterality: N/A;  . Retinal detachment surgery  Right   . Loop recorder implant  02/15/2014    MDT LINQ implanted by Dr Rayann Heman for cryptogenic stroke  . Tee without cardioversion N/A 02/15/2014    Procedure: TRANSESOPHAGEAL ECHOCARDIOGRAM (TEE);  Surgeon: Josue Hector, MD;  Location: [facility] ENDOSCOPY;  Service: Cardiovascular;  Laterality: N/A;  . Left heart catheterization with coronary/graft angiogram  07/01/2011    Procedure: LEFT HEART CATHETERIZATION WITH Beatrix Fetters;  Surgeon: Sanda Klein, MD;  Location: [facility] CATH LAB;  Service: Cardiovascular;;  . Percutaneous coronary stent intervention (pci-s) Right 07/01/2011    Procedure: PERCUTANEOUS CORONARY STENT INTERVENTION (PCI-S);  Surgeon: Sanda Klein, MD;  Location: [facility] C[facility] LAB;  Service: Cardiovascular;  Laterality: Right;    There were no vitals filed for this visit.  Visit Diagnosis:  Difficulty walking  Left leg weakness      Subjective Assessment - 09/06/14 1146    Subjective Pt denied falls since last visit. Pt reported he took an anti-nausea pill prior to PT today.   Patient is accompained by: Family member   Pertinent History MI, CABG, DM, CA   Patient Stated Goals walk better and longer distances   Currently in Pain? Yes   Pain Score 9    Pain Location Head  Pain Orientation Other (Comment);Right  both temples, starts at back of head   Pain Descriptors / Indicators Aching;Sharp   Pain Type Chronic pain   Pain Onset More than a month ago   Pain Frequency Constant   Aggravating Factors  loud noises   Pain Relieving Factors lying down                         [facility] Adult PT Treatment/Exercise - 09/06/14 1149    Ambulation/Gait   Ambulation/Gait Yes   Ambulation/Gait Assistance 4: Min guard;4: Min assist   Ambulation/Gait Assistance Details Pt ambulated with L posterior [place] AFO and L anterior AFO, 1/4" heel wedge, and simulated L toe cap during gait. Pt continued to require extensive VC's and demonstration to decrease L genu  recurvatum.  Min A required during 1 LOB episode.   Ambulation Distance (Feet) --  42', 36', 49', 70'   Assistive device Large base quad cane   Gait Pattern Step-to pattern;Decreased stance time - left;Decreased step length - right;Left circumduction;Left foot flat;Decreased hip/knee flexion - left;Left genu recurvatum   Ambulation Surface Level;Indoor                PT Education - 09/06/14 1241    Education provided Yes   Education Details PT educated pt on needing deeper shoes to accomodate higher heel wedge to assist in decreasing L genu recurvatum.   Person(s) Educated Patient;Spouse   Methods Explanation   Comprehension Verbalized understanding          PT Short Term Goals - 08/23/14 1522    PT SHORT TERM GOAL #1   Title Pt will be independent in HEP to improve strength, balance, endurance, and safety. Target date: 08/16/14.   Status Not Met   PT SHORT TERM GOAL #2   Title Perform BERG and write goals if appropriate. Target date: 08/16/14.  pt was unable to complete full Berg today due to nausea and dizziness symptoms    Status Not Met   PT SHORT TERM GOAL #3   Title Pt will perform sit<>stand with UE assist at MOD I level to improve functional mobilty. Target date: 08/16/14.  pt partially met, requires CGA for transfers and multiple attempts   Status Not Met   PT SHORT TERM GOAL #4   Title Pt will ambulate 200' over even terrain with LRAD and supervision to improve functional mobility. Target date: 08/16/14.   Status Not Met   PT SHORT TERM GOAL #5   Title Pt will perform TUG in </=90 seconds to reduce falls risk. Target date: 08/16/14.   Status Achieved           PT Long Term Goals - 08/30/14 1240    PT LONG TERM GOAL #1   Title Pt will verbalize understanding of CVA signs/symptoms and risk factors to reduce risk of another CVA. Target date: 09/13/14.   Status On-going   PT LONG TERM GOAL #2   Title Pt will report no falls over the last four weeks to improve  safety during functional mobility. Target date; 09/13/14.   Status On-going   PT LONG TERM GOAL #3   Title Pt will ambulate 400' over even/uneven terrain with LRAD at MOD I level to improve functional mobililty. Target date: 09/13/14.   Status On-going   PT LONG TERM GOAL #4   Title Pt's SIS-mobility score will improve by 10% to improve quality of life. Target date: 09/13/14.   Status  On-going   PT LONG TERM GOAL #5   Title Pt's gait speed will improve to >/=1.78ft/sec. with LRAD to reduce falls risk. Target date: 09/13/14.   Status On-going   PT LONG TERM GOAL #6   Title Pt will improve BERG score to >/=32/56 to reduce falls risk. Target date: 09/13/14.   Status On-going               Plan - 09/06/14 1242    Clinical Impression Statement Pt continues to be limited by fatigue and pain, and requested to end PT session early due to headache. PT continues to explain the importance of acquiring shoe with increased depth to accomodate higher heel wedge, in order to decrease L genu recurvatum. Continue with POC.   Pt will benefit from skilled therapeutic intervention in order to improve on the following deficits Difficulty walking;Decreased safety awareness;Decreased endurance;Obesity;Decreased knowledge of use of DME;Decreased balance;Decreased mobility;Decreased strength;Impaired UE functional use;Other (comment)   Rehab Potential Fair   PT Frequency 2x / week   PT Duration 8 weeks   PT Treatment/Interventions ADLs/Self Care Home Management;Gait training;Neuromuscular re-education;Biofeedback;Functional mobility training;Patient/family education;Cryotherapy;Therapeutic activities;Wheelchair mobility training;Electrical Stimulation;Therapeutic exercise;Manual techniques;DME Instruction;Balance training;Other (comment)   PT Next Visit Plan Check LTGs and D/C   Consulted and Agree with Plan of Care Patient        Problem List Patient Active Problem List   Diagnosis Date Noted  . Dysphagia  07/26/2014  . Antiplatelet or antithrombotic long-term use 04/22/2014  . Chest pain 04/17/2014  . SOB (shortness of breath) 04/17/2014  . Bilateral pulmonary embolism 04/11/2014  . Left-sided neglect 03/09/2014  . Dysphagia, pharyngoesophageal phase 03/02/2014  . Arthralgia of left ankle 02/24/2014  . Hemiplegia affecting left dominant side 02/16/2014  . 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 Benign neoplasm of colon 02/10/2013  . Special screening for malignant neoplasms, colon 02/10/2013  . OSA on CPAP 01/06/2013  . S/P angioplasty with DES to CFX 07/01/11 07/02/2011  . Sleep apnea, "cant afford C-pap" 07/02/2011  . CAD, RCA PCI '09, 10/11, CABG X 4 5/12 06/30/2011  . Hypertension, B/P has been low this admission 06/30/2011  . Hyperlipidemia 06/30/2011  . Morbid obesity 06/30/2011  . NSTEMI, 06/29/11 06/30/2011  . 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 Ischemic cardiomyopathy, EF 35-40 2D May 2012 06/30/2011  . CHF, acute, mild 06/30/2011    Jameica Couts L 09/06/2014, 12:45 PM  [facility] [address] [place], [place], [address] Phone: [phone]   Fax:  [phone]     Geoffry Paradise, PT,DPT 09/06/2014 12:45 PM Phone: [phone] Fax: [phone]

## 2014-09-07 ENCOUNTER — Ambulatory Visit: Payer: 59

## 2014-09-07 ENCOUNTER — Encounter: Payer: 59 | Admitting: Occupational Therapy

## 2014-09-07 NOTE — Telephone Encounter (Signed)
I have spoken with [facility] faxed order over yesterday--she confirmed that she did get it/fim

## 2014-09-09 ENCOUNTER — Ambulatory Visit (INDEPENDENT_AMBULATORY_CARE_PROVIDER_SITE_OTHER): Payer: 59 | Admitting: Neurology

## 2014-09-09 ENCOUNTER — Encounter: Payer: Self-pay | Admitting: Neurology

## 2014-09-09 VITALS — BP 112/82 | HR 84 | Resp 16 | Ht <= 58 in | Wt 251.6 lb

## 2014-09-09 DIAGNOSIS — M5481 Occipital neuralgia: Secondary | ICD-10-CM | POA: Diagnosis not present

## 2014-09-09 DIAGNOSIS — I63411 Cerebral infarction due to embolism of right middle cerebral artery: Secondary | ICD-10-CM | POA: Diagnosis not present

## 2014-09-09 DIAGNOSIS — G8192 Hemiplegia, unspecified affecting left dominant side: Secondary | ICD-10-CM

## 2014-09-09 DIAGNOSIS — R519 Headache, unspecified: Secondary | ICD-10-CM

## 2014-09-09 DIAGNOSIS — M7552 Bursitis of left shoulder: Secondary | ICD-10-CM

## 2014-09-09 DIAGNOSIS — Z9989 Dependence on other enabling machines and devices: Secondary | ICD-10-CM

## 2014-09-09 DIAGNOSIS — G4733 Obstructive sleep apnea (adult) (pediatric): Secondary | ICD-10-CM | POA: Diagnosis not present

## 2014-09-09 DIAGNOSIS — R51 Headache: Secondary | ICD-10-CM

## 2014-09-09 MED ORDER — METHYLPHENIDATE HCL 10 MG PO TABS: ORAL_TABLET | ORAL | Status: DC

## 2014-09-09 MED ORDER — GABAPENTIN 300 MG PO CAPS: ORAL_CAPSULE | ORAL | Status: DC

## 2014-09-09 MED ORDER — TIZANIDINE HCL 4 MG PO TABS: ORAL_TABLET | ORAL | Status: DC

## 2014-09-09 NOTE — Progress Notes (Signed)
[facility]  PATIENT: Gregory Wiley DOB: 1950-05-07  REFERRING CLINICIAN: james Little HISTORY FROM: patient REASON FOR VISIT: stroke   HISTORICAL  CHIEF COMPLAINT:  Chief Complaint  Patient presents with  . History of stroke    He denies new stroke sx with left sided hemiplegia.  He is participating in pt/rehab but sts. after next week pt will no longer see him due to mult. c/o pain.  Sts. they are asking him to see a pain mx. physician.  Sts. he is doing well with increased dose of Xarelto, but doesn't fell increase in Ritalin has helped much.  Sts. he is compliant with cpap/fim  . 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    He has had chronic daily roight sided headache x 2 months and left shoulder pain x several months  Stroke History:  On 02/12/2014, he woke up with weakness on the left side and was unable to stand up. He went to the emergency room.Marland Kitchen MRI of the brain showed an acute right MCA territory stroke that mostly involved the right temporal lobe with extension to the insula and the basal ganglia on the right. MR angiogram showed an occlusion of the right M1 segment  Of note, he had been on aspirin and Plavix that had been discontinued a few weeks earlier due to a bleeding ulcer.   Ultrasound of the carotid arteries showed less than 39% stenosis. A transesophageal echocardiogram showed a LVEF equals 45%, there was septal hypokinesia, no PFO or ASD was identified.  Cardioembolism was suspected.  He was initially placed on Plavix but that was subsequently changed to Xarelto after he was found to have pulmonary embolisms while at rehabilitation center.  Gait/Strength/sensation:    Currently, he continues to have  weakness on the left side. Muscle tone has increased over the last couple months. His leg is still extremely weak and he cannot raise the knee any but can straighten the  lower leg out mildly.Marland Kitchen He can move left shoulder backwards to his arm.  He also has some numbness on the left side with some pain. He had some neglect initially that has improved on the left..  Swallow:   His dysphagia has continued to improve. He is able to eat normal food but his appetite is greatly reduced.  Bladder:  He has not had bladder issues from the stroke.  He notes some urinary hesitancy but has not had incontinence.  OSA/sleep/sleepiness:    He has obstructive sleep apnea but is not always compliant with his CPAP   --- he uses nightly but takes mask off in middle of night.. We discussed strategies to increase his compliance..   He has excessive daytime sleepiness and is on methylphenidate 10 mg by mouth twice a day with some benefit.  Nutrition:   He has lost nearly 100 pounds since the stroke. His appetite is poor. Recently Boost was added and we recommended increasing this to twice a day.  Headache:   For the past couple months, he has had a daily headache. The headache tends to be milder in the morning but builds up as a day goes on every day. It is located in the back of the head on the right and radiates forward towards the temple and over his eye on tense, he has difficulty participating in physical therapy.  Left shoulder pain:   He reports left  shoulder pain for the past few months. The pain will increase when his arm is moved or when he lays on his left side. In the past, he has had bursitis and injections have helped reduce the pain a fair amount.  Cognitive:   He feels his cognition has returned to baseline but wife feels that he tends to have poor attention and decrease executive function abilities.  Neglect has improved.  Data:   MRI images of the brain performed at [facility] showed an acute stroke involving the left MCA distribution. The MR angiogram images showed that he had no flow after the M1 segment of the middle cerebral artery.     REVIEW OF SYSTEMS:    Constitutional: No fevers, chills, sweats, or change in appetite.  He sleeps poorly.    He is very tired Eyes: No visual changes, double vision, eye pain Ear, nose and throat: No hearing loss, ear pain, nasal congestion, sore throat Cardiovascular: No chest pain, palpitations Respiratory:  No shortness of breath at rest or with exertion.   No wheezes GastrointestinaI: No nausea, vomiting, diarrhea, abdominal pain, fecal incontinence Genitourinary:  No dysuria, urinary retention or frequency.  No nocturia. Musculoskeletal:  No neck pain, back pain but he has shoulder and hip pain Integumentary: No rash, pruritus, skin lesions Neurological: as above Psychiatric: No depression at this time.  No anxiety Endocrine: No palpitations, diaphoresis, change in appetite, change in weigh.  He notes feeling cold and increased thirst Hematologic/Lymphatic:  No anemia, purpura, petechiae. Allergic/Immunologic: No itchy/runny eyes, nasal congestion, recent allergic reactions, rashes  ALLERGIES: Allergies  Allergen Reactions  . Diphenhydramine Hcl Anaphylaxis    HOME MEDICATIONS: Outpatient Prescriptions Prior to Visit  Medication Sig Dispense Refill  . acetaminophen (TYLENOL) 325 MG tablet Take 2 tablets (650 mg total) by mouth every 6 (six) hours as needed for mild pain.    Marland Kitchen ALPRAZolam (XANAX) 0.25 MG tablet Take 0.5 tablets (0.125 mg total) by mouth every 8 (eight) hours as needed for anxiety. 45 tablet 5  . aspirin 81 MG EC tablet Take 1 tablet (81 mg total) by mouth daily. 30 tablet 6  . carvedilol (COREG) 3.125 MG tablet Take 1 tablet (3.125 mg total) by mouth 2 (two) times daily with a meal. 180 tablet 2  . colchicine 0.6 MG tablet Take 0.6 mg by mouth as needed (for gout).   0  . feeding supplement (BOOST HIGH PROTEIN) LIQD Take 1 Container by mouth as directed.    . fluticasone (FLONASE) 50 MCG/ACT nasal spray Place 2 sprays into both nostrils daily.  2  . furosemide (LASIX) 40 MG tablet  Take 40 mg by mouth as needed for edema.    . gabapentin (NEURONTIN) 100 MG capsule Take 1 capsule (100 mg total) by mouth 2 (two) times daily. Bed-time 60 capsule 11  . hydrocortisone (ANUSOL-HC) 25 MG suppository Place 1 suppository (25 mg total) rectally daily. X 1 week; then, use as needed thereafter. 30 suppository 11  . lisinopril (PRINIVIL,ZESTRIL) 2.5 MG tablet Take 1 tablet (2.5 mg total) by mouth daily. 90 tablet 3  . meclizine (ANTIVERT) 25 MG tablet Take 1 tablet (25 mg total) by mouth 3 (three) times daily as needed for dizziness. (Patient taking differently: Take 25 mg by mouth 3 (three) times daily as needed for dizziness (bed-time). ) 30 tablet 0  . Melatonin 10 MG CAPS Take 1 capsule by mouth at bedtime.    . methylphenidate (RITALIN) 10 MG tablet Take 1  tablet (10 mg total) by mouth 2 (two) times daily. With breakfast and lunch 60 tablet 0  . nitroGLYCERIN (NITROSTAT) 0.4 MG SL tablet Place 0.4 mg under the tongue every 5 (five) minutes as needed. For chest pain    . ondansetron (ZOFRAN ODT) 4 MG disintegrating tablet Take 1 tablet (4 mg total) by mouth every 8 (eight) hours as needed for nausea or vomiting. 15 tablet 0  . OVER THE COUNTER MEDICATION Inhale 1 application into the lungs at bedtime. C-PAP    . pantoprazole (PROTONIX) 40 MG tablet Take 1 tablet (40 mg total) by mouth 2 (two) times daily. 60 tablet 6  . ranolazine (RANEXA) 500 MG 12 hr tablet Take 1 tablet (500 mg total) by mouth 2 (two) times daily. 180 tablet 1  . rivaroxaban (XARELTO) 20 MG TABS tablet Take 1 tablet (20 mg total) by mouth daily with supper. 30 tablet 11  . rosuvastatin (CRESTOR) 40 MG tablet Take 1 tablet (40 mg total) by mouth daily. 30 tablet 0  . Sennosides (SENOKOT PO) Take 2 tablets by mouth daily.    Marland Kitchen tiZANidine (ZANAFLEX) 2 MG tablet Take 1 tablet by mouth daily.    . Vitamin D, Ergocalciferol, (DRISDOL) 50000 UNITS CAPS capsule Take 50,000 Units by mouth every 7 (seven) days. Take on Mondays      No facility-administered medications prior to visit.    PAST MEDICAL HISTORY: Past Medical History  Diagnosis Date  . Coronary artery disease     stent, then CABG 07/20/10  . MI (myocardial infarction)     NSTEMI- last cath 06/2011-Stent to LCX-DES, last nuc 07/30/11 low risk  . Diabetes mellitus     hx of, diet controlled  . Hypertension   . Hyperlipidemia   . Obesity (BMI 30-39.9)   . OSA on CPAP     since July 2013  . H/O cardiomyopathy     ischemic, now last echo 07/30/11, EF 38%W  . Diverticulosis   . Internal hemorrhoids   . Tubular adenoma of colon   . Plantar fasciitis of left foot   . Stroke     PAST SURGICAL HISTORY: Past Surgical History  Procedure Laterality Date  . Coronary artery bypass graft  07/20/2010     LIMA-LAD; VG-ACUTE MARG of RCA; Seq VG-distal RCA & then pda  . Coronary angioplasty with stent placement  07/01/2011    DES-Resolute to native LCX  . Coronary angioplasty with stent placement  12/01/2007    COMPLEX 5 LESION PCI INCLUDING CUTTING BALLOON AND 4 CYPHER DESs  . Hernia repair  2006  . Knee surgery Right   . Hand surgery Right     to take glass out  . Colonoscopy N/A 02/10/2013    Procedure: COLONOSCOPY;  Surgeon: Ladene Artist, MD;  Location: [facility] ENDOSCOPY;  Service: Endoscopy;  Laterality: N/A;  . Retinal detachment surgery Right   . Loop recorder implant  02/15/2014    MDT LINQ implanted by Dr Rayann Heman for cryptogenic stroke  . Tee without cardioversion N/A 02/15/2014    Procedure: TRANSESOPHAGEAL ECHOCARDIOGRAM (TEE);  Surgeon: Josue Hector, MD;  Location: [facility] ENDOSCOPY;  Service: Cardiovascular;  Laterality: N/A;  . Left heart catheterization with coronary/graft angiogram  07/01/2011    Procedure: LEFT HEART CATHETERIZATION WITH Beatrix Fetters;  Surgeon: Sanda Klein, MD;  Location: [facility] CATH LAB;  Service: Cardiovascular;;  . Percutaneous coronary stent intervention (pci-s) Right 07/01/2011    Procedure: PERCUTANEOUS CORONARY  STENT INTERVENTION (PCI-S);  Surgeon: Dani Gobble  Croitoru, MD;  Location: [facility] CATH LAB;  Service: Cardiovascular;  Laterality: Right;    FAMILY HISTORY: Family History  Problem Relation Age of Onset  . Diabetes type II Mother   . Coronary artery disease    . Colon cancer Neg Hx   . CAD Father 67    CABG, PCI, died at 22  . Heart attack Father 77  . Hypertension Brother   . Diabetes Brother   . Hyperlipidemia Brother   . Brain cancer Maternal Grandmother   . COPD Paternal Grandfather   . Other      denies family history of DVT/PE  . Stroke Neg Hx     SOCIAL HISTORY:  History   Social History  . Marital Status: Married    Spouse Name: N/A  . Number of Children: 5  . Years of Education: N/A   Occupational History  .      works at the [employer] Topics  . Smoking status: Former Research scientist (life sciences)  . Smokeless tobacco: Never Used  . Alcohol Use: 0.0 oz/week    0 Standard drinks or equivalent per week     Comment: 1-2 times per year  . Drug Use: No  . Sexual Activity: Not on file   Other Topics Concern  . Not on file   Social History Narrative   Quit smoking 40 years ago   Married    Likes to play guitar and sing with church group unable since 02/12/14 stroke      PHYSICAL EXAM  Filed Vitals:   09/09/14 0910  BP: 112/82  Pulse: 84  Resp: 16  Height: 6" (0.152 m)  Weight: 251 lb 9.6 oz (114.125 kg)    Body mass index is 4,939.62 kg/(m^2).   General: The patient is well-developed and well-nourished and in no acute distress.  He is in a wheelchair.  Eyes:  Funduscopic exam shows normal optic discs and retinal vessels.  Neck: The neck is supple, no carotid bruits are noted.  The neck is nontender.  Respiratory: The respiratory examination is clear.  Cardiovascular: The cardiovascular examination reveals a regular rate and rhythm, no murmurs, gallops or rubs are noted.  Skin: He has mild edema at the left ankle, compared to the right.     Neurologic Exam  Mental status: The patient is alert and oriented x 3 at the time of the examination. The patient has apparent normal recent and remote memory. Attention is mildly reduced.   Speech is normal.  Cranial nerves: Extraocular movements are full. Psychotic pursuit is reduced to the left. Pupils are equal, round, and reactive to light and accomodation.  Visual fields appear to be full by confrontation.Marland Kitchen  He no longer has a facial droop. There is good facial sensation to soft touch bilaterally.  Trapezius and sternocleidomastoid strength is reduced on the left.. No dysarthria is noted.  The tongue is midline, and the patient has symmetric elevation of the soft palate. No obvious hearing deficits are noted.  Motor:  Muscle bulk is normal. He has increased tone in the left arm and leg.  Arm is held in a flexed position. This mostly involved the flexor muscles of the arm and flexors and extensors of the leg. Strength is 5/5 on the right. Strength is 2/5 in left shoulder elevation and 1/5 in proximal arm strength. Strength is 2/5 in the quadriceps and 1/5 in other proximal muscles of the left leg  Sensory: Sensory testing is intact to  pinprick, soft touch, vibration sensation, and position sense on all 4 extremities. He did not have neglect or extinction at this time.  Coordination: Cerebellar testing reveals good finger-nose-finger and heel-to-shin on the right and he cannot do these tasks on the left..  Gait and station: Station requires unilateral support. He is only able to take a few steps with support. He was able to pivot without difficulty.    Reflexes: Deep tendon reflexes are increased on the left.. Plantar responses are normal on the right and extensor on the left.Marland Kitchen    DIAGNOSTIC DATA (LABS, IMAGING, TESTING) - I reviewed patient records, labs, notes, testing and imaging myself where available.  Lab Results  Component Value Date   WBC 7.6 07/22/2014   HGB 13.8 07/22/2014    HCT 42.5 07/22/2014   MCV 83.3 07/22/2014   PLT 204 07/22/2014      Component Value Date/Time   NA 138 07/22/2014 1129   K 3.9 07/22/2014 1129   CL 99 07/22/2014 1129   CO2 28 07/22/2014 1129   GLUCOSE 90 07/22/2014 1129   BUN 10 07/22/2014 1129   CREATININE 0.84 07/22/2014 1129   CREATININE 1.22 05/03/2014 1040   CALCIUM 8.9 07/22/2014 1129   PROT 7.1 07/22/2014 1129   ALBUMIN 3.7 07/22/2014 1129   AST 13 07/22/2014 1129   ALT 11 07/22/2014 1129   ALKPHOS 55 07/22/2014 1129   BILITOT 0.6 07/22/2014 1129   GFRNONAA 61* 05/03/2014 1040   GFRAA 71* 05/03/2014 1040   Lab Results  Component Value Date   CHOL 113 07/22/2014   HDL 38* 07/22/2014   LDLCALC 54 07/22/2014   TRIG 104 07/22/2014   CHOLHDL 3.0 07/22/2014   Lab Results  Component Value Date   HGBA1C 5.6 02/13/2014   No results found for: [id number] Lab Results  Component Value Date   TSH 0.828 07/22/2014        ASSESSMENT AND PLAN  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   Continue therapy for post stroke left sided hemiplegia. Spasticity is a problem and I will increase his tizanidine to 4 mg 3 times a day and increased further if tolerated. We will also consider Botox therapy if he does not benefit from the oral agents.  I'll also continue the gabapentin but increase the dose for his dysesthesia. 2.  Continue Ritalin for stroke related fatigue and attention deficit. 3.  Inject left subacromial bursa with 40 mg Depo-Medrol in 3 mL Marcaine using sterile technique. He tolerated the procedure well and pain was much better after a few minutes. 4.   Right occipital nerve block with 40 mg Depo-Medrol in 3 mL Marcaine. He tolerated the procedure well and pain was much better afterwards. 5.   He is advised to use his CPAP on a regular basis for his  obstructive sleep apnea.    He will return to see me in 4 months or sooner if he has new or worsening neurologic symptoms.    Leshay Desaulniers A. Felecia Shelling, MD, PhD 0/86/7619, 5:09 AM Certified in Neurology, Clinical Neurophysiology, Sleep Medicine, Pain Medicine and Neuroimaging  [facility] [address], [place], [place] [address] [phone]

## 2014-09-13 ENCOUNTER — Ambulatory Visit: Payer: 59 | Admitting: Occupational Therapy

## 2014-09-13 ENCOUNTER — Encounter: Payer: Self-pay | Admitting: Internal Medicine

## 2014-09-13 ENCOUNTER — Ambulatory Visit (INDEPENDENT_AMBULATORY_CARE_PROVIDER_SITE_OTHER): Payer: 59 | Admitting: *Deleted

## 2014-09-13 ENCOUNTER — Ambulatory Visit: Payer: 59

## 2014-09-13 ENCOUNTER — Other Ambulatory Visit: Payer: Self-pay | Admitting: *Deleted

## 2014-09-13 ENCOUNTER — Encounter: Payer: Self-pay | Admitting: Occupational Therapy

## 2014-09-13 DIAGNOSIS — R29898 Other symptoms and signs involving the musculoskeletal system: Secondary | ICD-10-CM

## 2014-09-13 DIAGNOSIS — Z7409 Other reduced mobility: Secondary | ICD-10-CM

## 2014-09-13 DIAGNOSIS — I63411 Cerebral infarction due to embolism of right middle cerebral artery: Secondary | ICD-10-CM | POA: Diagnosis not present

## 2014-09-13 DIAGNOSIS — R262 Difficulty in walking, not elsewhere classified: Secondary | ICD-10-CM | POA: Diagnosis not present

## 2014-09-13 DIAGNOSIS — R531 Weakness: Secondary | ICD-10-CM

## 2014-09-13 DIAGNOSIS — R0602 Shortness of breath: Secondary | ICD-10-CM

## 2014-09-13 LAB — BASIC METABOLIC PANEL WITH GFR
BUN: 12 mg/dL (ref 6–23)
CO2: 26 mEq/L (ref 19–32)
Calcium: 8.6 mg/dL (ref 8.4–10.5)
Chloride: 103 mEq/L (ref 96–112)
Creat: 0.83 mg/dL (ref 0.50–1.35)
GFR, Est African American: >89 mL/min
GFR, Est Non African American: >89 mL/min
Glucose, Bld: 85 mg/dL (ref 70–99)
Potassium: 3.9 mEq/L (ref 3.5–5.3)
Sodium: 141 mEq/L (ref 135–145)

## 2014-09-13 NOTE — Therapy (Signed)
[facility] [address] [place], [place], [address] Phone: [phone]   Fax:  [phone]  Physical Therapy Treatment  Patient Details  Name: Gregory Wiley MRN: [id number] Date of Birth: [birth year] Referring Provider:  Tamsen Roers, MD  Encounter Date: 09/13/2014      PT End of Session - 09/13/14 1326    Visit Number 15   Number of Visits 17   Date for PT Re-Evaluation 09/17/14   Authorization Type UHC, Medicare will be active 11/17/14.   PT Start Time 1104   PT Stop Time 1144   PT Time Calculation (min) 40 min   Equipment Utilized During Treatment Gait belt   Activity Tolerance Patient tolerated treatment well   Behavior During Therapy Flat affect  however, he did smile more today      Past Medical History  Diagnosis Date  . Coronary artery disease     stent, then CABG 07/20/10  . MI (myocardial infarction)     NSTEMI- last cath 06/2011-Stent to LCX-DES, last nuc 07/30/11 low risk  . Diabetes mellitus     hx of, diet controlled  . Hypertension   . Hyperlipidemia   . Obesity (BMI 30-39.9)   . OSA on CPAP     since July 2013  . H/O cardiomyopathy     ischemic, now last echo 07/30/11, EF 38%W  . Diverticulosis   . Internal hemorrhoids   . Tubular adenoma of colon   . Plantar fasciitis of left foot   . Stroke     Past Surgical History  Procedure Laterality Date  . Coronary artery bypass graft  07/20/2010     LIMA-LAD; VG-ACUTE MARG of RCA; Seq VG-distal RCA & then pda  . Coronary angioplasty with stent placement  07/01/2011    DES-Resolute to native LCX  . Coronary angioplasty with stent placement  12/01/2007    COMPLEX 5 LESION PCI INCLUDING CUTTING BALLOON AND 4 CYPHER DESs  . Hernia repair  2006  . Knee surgery Right   . Hand surgery Right     to take glass out  . Colonoscopy N/A 02/10/2013    Procedure: COLONOSCOPY;  Surgeon: Ladene Artist, MD;  Location: [facility] ENDOS[facility];  Service: Endoscopy;   Laterality: N/A;  . Retinal detachment surgery Right   . Loop recorder implant  02/15/2014    MDT LINQ implanted by Dr Rayann Heman for cryptogenic stroke  . Tee without cardioversion N/A 02/15/2014    Procedure: TRANSESOPHAGEAL ECHOCARDIOGRAM (TEE);  Surgeon: Josue Hector, MD;  Location: [facility] ENDOSCOPY;  Service: Cardiovascular;  Laterality: N/A;  . Left heart catheterization with coronary/graft angiogram  07/01/2011    Procedure: LEFT HEART CATHETERIZATION WITH Beatrix Fetters;  Surgeon: Sanda Klein, MD;  Location: [facility] CATH LAB;  Service: Cardiovascular;;  . Percutaneous coronary stent intervention (pci-s) Right 07/01/2011    Procedure: PERCUTANEOUS CORONARY STENT INTERVENTION (PCI-S);  Surgeon: Sanda Klein, MD;  Location: [facility] CATH LAB;  Service: Cardiovascular;  Laterality: Right;    There were no vitals filed for this visit.  Visit Diagnosis:  Difficulty walking  Left leg weakness      Subjective Assessment - 09/13/14 1107    Subjective Pt reported he fell when getting out of chair but denied hitting head or other injuries. Pt reported he went to Dr. Felecia Shelling last Friday and he received corticosteroid injections to head and neck and his headache went away.   Patient is accompained by: Family member   Pertinent History MI, CABG,  DM, CA   Patient Stated Goals walk better and longer distances   Currently in Pain? Yes   Pain Score 2    Pain Location Elbow   Pain Orientation Right  blood draw site   Pain Descriptors / Indicators Aching   Pain Type Acute pain   Pain Onset Today   Pain Frequency Constant   Aggravating Factors  blood draw site   Pain Relieving Factors none                         [facility] Adult PT Treatment/Exercise - 09/13/14 1115    Ambulation/Gait   Ambulation/Gait Yes   Ambulation/Gait Assistance 4: Min guard   Ambulation/Gait Assistance Details Thomas: orthotist from [facility] present during session, he brought pt's new AFO to session. Pt  ambulated with L anterior [place] AFO and heel wedge, pt also ambulated with L post. [place] AFO and heel wedge. Cues to decr. L genu recurvatum, improve L toe clearance. Pt did experience L knee buckling x2 when ambulating with L post. AFO.   Ambulation Distance (Feet) --  37' with L ant. AFO and 117' with L post. AFO   Assistive device Large base quad cane   Gait Pattern Step-to pattern;Decreased stance time - left;Decreased step length - right;Left circumduction;Left foot flat;Decreased hip/knee flexion - left;Left genu recurvatum   Ambulation Surface Level;Indoor                PT Education - 09/13/14 1324    Education provided Yes   Education Details PT and orthotist discussed pt purchasing 1/2 size larger shoe to accomodate AFO. Orthotist notified pt he brought the incorrect AFO and will bring the correct one next session.   Person(s) Educated Patient;Spouse   Methods Explanation   Comprehension Verbalized understanding          PT Short Term Goals - 08/23/14 1522    PT SHORT TERM GOAL #1   Title Pt will be independent in HEP to improve strength, balance, endurance, and safety. Target date: 08/16/14.   Status Not Met   PT SHORT TERM GOAL #2   Title Perform BERG and write goals if appropriate. Target date: 08/16/14.  pt was unable to complete full Berg today due to nausea and dizziness symptoms    Status Not Met   PT SHORT TERM GOAL #3   Title Pt will perform sit<>stand with UE assist at MOD I level to improve functional mobilty. Target date: 08/16/14.  pt partially met, requires CGA for transfers and multiple attempts   Status Not Met   PT SHORT TERM GOAL #4   Title Pt will ambulate 200' over even terrain with LRAD and supervision to improve functional mobility. Target date: 08/16/14.   Status Not Met   PT SHORT TERM GOAL #5   Title Pt will perform TUG in </=90 seconds to reduce falls risk. Target date: 08/16/14.   Status Achieved           PT Long Term Goals -  08/30/14 1240    PT LONG TERM GOAL #1   Title Pt will verbalize understanding of CVA signs/symptoms and risk factors to reduce risk of another CVA. Target date: 09/13/14.   Status On-going   PT LONG TERM GOAL #2   Title Pt will report no falls over the last four weeks to improve safety during functional mobility. Target date; 09/13/14.   Status On-going   PT LONG TERM GOAL #3  Title Pt will ambulate 400' over even/uneven terrain with LRAD at MOD I level to improve functional mobililty. Target date: 09/13/14.   Status On-going   PT LONG TERM GOAL #4   Title Pt's SIS-mobility score will improve by 10% to improve quality of life. Target date: 09/13/14.   Status On-going   PT LONG TERM GOAL #5   Title Pt's gait speed will improve to >/=1.22f/sec. with LRAD to reduce falls risk. Target date: 09/13/14.   Status On-going   PT LONG TERM GOAL #6   Title Pt will improve BERG score to >/=32/56 to reduce falls risk. Target date: 09/13/14.   Status On-going               Plan - 09/13/14 1326    Clinical Impression Statement Pt continues to require cues to reduce L genu recurvatum during ambulation and was noted to experience improved toe clearance and decreased fatigue during ambulation with L ant. AFO and heel wedge vs. L post. AFO. Continue with POC, d/c next session.   Pt will benefit from skilled therapeutic intervention in order to improve on the following deficits Difficulty walking;Decreased safety awareness;Decreased endurance;Obesity;Decreased knowledge of use of DME;Decreased balance;Decreased mobility;Decreased strength;Impaired UE functional use;Other (comment)   Rehab Potential Fair   PT Frequency 2x / week   PT Duration 8 weeks   PT Treatment/Interventions ADLs/Self Care Home Management;Gait training;Neuromuscular re-education;Biofeedback;Functional mobility training;Patient/family education;Cryotherapy;Therapeutic activities;Wheelchair mobility training;Electrical  Stimulation;Therapeutic exercise;Manual techniques;DME Instruction;Balance training;Other (comment)   PT Next Visit Plan Check LTGs and D[place].   PT Home Exercise Plan Strength/balance HEP   Consulted and Agree with Plan of Care Patient;Family member/caregiver   Family Member Consulted wife-Laura        Problem List Patient Active Problem List   Diagnosis Date Noted  . Occipital neuralgia 09/09/2014  . Headache disorder 09/09/2014  . Bursitis of left shoulder 09/09/2014  . Dysphagia 07/26/2014  . Antiplatelet or antithrombotic long-term use 04/22/2014  . Chest pain 04/17/2014  . SOB (shortness of breath) 04/17/2014  . Bilateral pulmonary embolism 04/11/2014  . Left-sided neglect 03/09/2014  . Dysphagia, pharyngoesophageal phase 03/02/2014  . Arthralgia of left ankle 02/24/2014  . Hemiplegia affecting left dominant side 02/16/2014  . 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 Benign neoplasm of colon 02/10/2013  . Special screening for malignant neoplasms, colon 02/10/2013  . OSA on CPAP 01/06/2013  . S/P angioplasty with DES to CFX 07/01/11 07/02/2011  . Sleep apnea, "cant afford C-pap" 07/02/2011  . CAD, RCA PCI '09, 10/11, CABG X 4 5/12 06/30/2011  . Hypertension, B/P has been low this admission 06/30/2011  . Hyperlipidemia 06/30/2011  . Morbid obesity 06/30/2011  . NSTEMI, 06/29/11 06/30/2011  . 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 Ischemic cardiomyopathy, EF 35-40 2D May 2012 06/30/2011  . CHF, acute, mild 06/30/2011    Blayne Garlick L 09/13/2014, 1:29 PM  [facility] [address]Phone: [phone]  Fax:  [phone]    JGeoffry Paradise PT,DPT 09/13/2014 1:29 PM Phone: [phone]: [phone]

## 2014-09-13 NOTE — Therapy (Signed)
[facility] [address] [place], [place], [address] Phone: [phone]   Fax:  [phone]  Occupational Therapy Treatment  Patient Details  Name: Gregory Wiley MRN: [id number] Date of Birth: Jul 31, 1950 Referring Provider:  Tamsen Roers, MD  Encounter Date: 09/13/2014      OT End of Session - 09/13/14 1216    Visit Number 16   Number of Visits 16   Date for OT Re-Evaluation 09/13/14   Authorization Type [employer]   OT Start Time 1145   OT Stop Time 1328   OT Time Calculation (min) 103 min   Activity Tolerance Patient tolerated treatment well      Past Medical History  Diagnosis Date  . Coronary artery disease     stent, then CABG 07/20/10  . MI (myocardial infarction)     NSTEMI- last cath 06/2011-Stent to LCX-DES, last nuc 07/30/11 low risk  . Diabetes mellitus     hx of, diet controlled  . Hypertension   . Hyperlipidemia   . Obesity (BMI 30-39.9)   . OSA on CPAP     since July 2013  . H/O cardiomyopathy     ischemic, now last echo 07/30/11, EF 38%W  . Diverticulosis   . Internal hemorrhoids   . Tubular adenoma of colon   . Plantar fasciitis of left foot   . Stroke     Past Surgical History  Procedure Laterality Date  . Coronary artery bypass graft  07/20/2010     LIMA-LAD; VG-ACUTE MARG of RCA; Seq VG-distal RCA & then pda  . Coronary angioplasty with stent placement  07/01/2011    DES-Resolute to native LCX  . Coronary angioplasty with stent placement  12/01/2007    COMPLEX 5 LESION PCI INCLUDING CUTTING BALLOON AND 4 CYPHER DESs  . Hernia repair  2006  . Knee surgery Right   . Hand surgery Right     to take glass out  . Colonoscopy N/A 02/10/2013    Procedure: COLONOSCOPY;  Surgeon: Ladene Artist, MD;  Location: [facility] ENDOSCOPY;  Service: Endoscopy;  Laterality: N/A;  . Retinal detachment surgery Right   . Loop recorder implant  02/15/2014    MDT LINQ implanted by Dr Rayann Heman for  cryptogenic stroke  . Tee without cardioversion N/A 02/15/2014    Procedure: TRANSESOPHAGEAL ECHOCARDIOGRAM (TEE);  Surgeon: Josue Hector, MD;  Location: [facility] ENDOSCOPY;  Service: Cardiovascular;  Laterality: N/A;  . Left heart catheterization with coronary/graft angiogram  07/01/2011    Procedure: LEFT HEART CATHETERIZATION WITH Beatrix Fetters;  Surgeon: Sanda Klein, MD;  Location: [facility] CATH LAB;  Service: Cardiovascular;;  . Percutaneous coronary stent intervention (pci-s) Right 07/01/2011    Procedure: PERCUTANEOUS CORONARY STENT INTERVENTION (PCI-S);  Surgeon: Sanda Klein, MD;  Location: [facility] CATH LAB;  Service: Cardiovascular;  Laterality: Right;    There were no vitals filed for this visit.  Visit Diagnosis:  Generalized weakness  Impaired functional mobility and activity tolerance      Subjective Assessment - 09/13/14 1152    Subjective  My headaches are completely gone - my dr did an injection   Patient is accompained by: Family member   Pertinent History see epic snapshot, pt with low orthostatic BP, pt with DVT in LLE and PEs - on Zarelto.     Patient Stated Goals walk again, play guitar and sing, wife hopes he gets more use of hand and can do more of his dressing and bathing   Currently  in Pain? No/denies                      OT Treatments/Exercises (OP) - 09/13/14 0001    ADLs   Cooking Addressed cooking at ambulatory level with emphasis on increasing activity tolerance, functional ambulation, safety, dynamic standing balance, incorporation of LUE into tasks. Pt able to tolerate 15 minutes of activity without a rest break, 22 minutes with one rest break. Discussed final HEP, safety and discharge from OT. Pt and wife able to verbalize understanding of all information.                  OT Short Term Goals - 09/13/14 1215    OT SHORT TERM GOAL #1   Title Pt and wife will be mod I with HEP - 08/16/2014   Status Achieved   OT SHORT TERM GOAL  #2   Title Pt will report no more then 3/10 pain with PROM of shoulder flexion to at least 90* to assist with self care.   Baseline able to acheive 85* with pain 3/10 or less   Status Partially Met   OT SHORT TERM GOAL #3   Title Pt will require no more than moderate assist for LB dressing   Baseline max A shoes,/ socks, mod Afor shorts   Status Partially Met   OT SHORT TERM GOAL #4   Title Pt will require supervision for UB dressing, mod vc's   Status Achieved   OT SHORT TERM GOAL #5   Title Pt will require no more than min a for toilet transfers   Status Achieved   OT SHORT TERM GOAL #6   Title Pt will require min a to hike pants with toileting   Baseline per pt report   Status Achieved           OT Long Term Goals - 09/13/14 1215    OT LONG TERM GOAL #1   Title Pt and wife will be mod I with upgraded HEP PRN - 09/13/2014   Status Achieved   OT LONG TERM GOAL #2   Title Pt will be min a with LB dressing   Baseline --  with AE equipment   Status Achieved   OT LONG TERM GOAL #3   Title Pt will be supervision UB bathing   Status Achieved   OT LONG TERM GOAL #4   Title Pt will be mod a LB bathing   Status Achieved  min a with equipment   OT LONG TERM GOAL #5   Title Pt will be min a for transfer tub bench into shower   Status Achieved   OT LONG TERM GOAL #6   Title Pt will require min a for simple hot meal prep at wheelchair level , standing at counter intermittently with close S   Status Achieved   OT LONG TERM GOAL #7   Title Pt will recall 2 strategies for adapted bathing/dressing   Status Achieved   OT LONG TERM GOAL #8   Title Pt will tolerate 20 minutes of functional activity without rest breaks   Status Partially Met  Pt tolerated 15 mins without rest break, 22 minutes with one rest break               Plan - 09/13/14 1216    Clinical Impression Statement Pt has met all but one LTG and is now ready for d/c from OT services.  Pt has made significant  improvement -  see goals for details   Pt will benefit from skilled therapeutic intervention in order to improve on the following deficits (Retired) Decreased activity tolerance;Decreased balance;Decreased endurance;Decreased cognition;Decreased knowledge of use of DME;Decreased mobility;Decreased range of motion;Decreased safety awareness;Decreased strength;Impaired UE functional use;Impaired tone;Pain;Impaired flexibility   Rehab Potential Good   Clinical Impairments Affecting Rehab Potential Pt will benefit from skilled OT to address the above deficits.   OT Frequency 2x / week   OT Duration 8 weeks   OT Treatment/Interventions Self-care/ADL training;Moist Heat;Electrical Stimulation;DME and/or AE instruction;Neuromuscular education;Therapeutic exercise;Functional Mobility Training;Manual Therapy;Passive range of motion;Splinting;Therapeutic activities;Balance training;Patient/family education;Cognitive remediation/compensation   Plan d/c from OT today   Consulted and Agree with Plan of Care Patient;Family member/caregiver   Family Member Consulted wife        Problem List Patient Active Problem List   Diagnosis Date Noted  . Occipital neuralgia 09/09/2014  . Headache disorder 09/09/2014  . Bursitis of left shoulder 09/09/2014  . Dysphagia 07/26/2014  . Antiplatelet or antithrombotic long-term use 04/22/2014  . Chest pain 04/17/2014  . SOB (shortness of breath) 04/17/2014  . Bilateral pulmonary embolism 04/11/2014  . Left-sided neglect 03/09/2014  . Dysphagia, pharyngoesophageal phase 03/02/2014  . Arthralgia of left ankle 02/24/2014  . Hemiplegia affecting left dominant side 02/16/2014  . 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 Benign neoplasm of colon 02/10/2013  . Special screening for malignant neoplasms, colon 02/10/2013  . OSA on CPAP 01/06/2013  . S/P angioplasty with DES to CFX 07/01/11 07/02/2011  . Sleep apnea, "cant afford C-pap" 07/02/2011   . CAD, RCA PCI '09, 10/11, CABG X 4 5/12 06/30/2011  . Hypertension, B/P has been low this admission 06/30/2011  . Hyperlipidemia 06/30/2011  . Morbid obesity 06/30/2011  . NSTEMI, 06/29/11 06/30/2011  . 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 Ischemic cardiomyopathy, EF 35-40 2D May 2012 06/30/2011  . CHF, acute, mild 06/30/2011   OCCUPATIONAL THERAPY DISCHARGE SUMMARY  Visits from Start of Care: 16  Current functional level related to goals / functional outcomes: See goal status above   Remaining deficits: Decreased activity tolerance, decreased balance, decreased safety, decreased functional use of LUE   Education / Equipment: HEP Plan: Patient agrees to discharge.  Patient goals were met. Patient is being discharged due to meeting the stated rehab goals.  ?????      Quay Burow, OTR/L 09/13/2014, 12:54 PM  [facility] [address] [place], [place], [address] Phone: [phone]   Fax:  [phone]

## 2014-09-14 ENCOUNTER — Ambulatory Visit
Admission: RE | Admit: 2014-09-14 | Discharge: 2014-09-14 | Disposition: A | Discharge status: Home / Self Care | Payer: 59 | Source: Ambulatory Visit | Attending: Cardiovascular Disease | Admitting: Cardiovascular Disease

## 2014-09-14 ENCOUNTER — Other Ambulatory Visit: Payer: Self-pay | Admitting: *Deleted

## 2014-09-14 ENCOUNTER — Other Ambulatory Visit: Payer: Self-pay | Admitting: Physician Assistant

## 2014-09-14 ENCOUNTER — Ambulatory Visit (HOSPITAL_COMMUNITY)
Admission: RE | Admit: 2014-09-14 | Discharge: 2014-09-14 | Disposition: A | Discharge status: Home / Self Care | Payer: 59 | Source: Ambulatory Visit | Attending: Internal Medicine | Admitting: Internal Medicine

## 2014-09-14 DIAGNOSIS — Z87891 Personal history of nicotine dependence: Secondary | ICD-10-CM | POA: Insufficient documentation

## 2014-09-14 DIAGNOSIS — I428 Other cardiomyopathies: Secondary | ICD-10-CM | POA: Insufficient documentation

## 2014-09-14 DIAGNOSIS — I429 Cardiomyopathy, unspecified: Secondary | ICD-10-CM

## 2014-09-14 DIAGNOSIS — Z86711 Personal history of pulmonary embolism: Secondary | ICD-10-CM

## 2014-09-14 DIAGNOSIS — Z8249 Family history of ischemic heart disease and other diseases of the circulatory system: Secondary | ICD-10-CM | POA: Insufficient documentation

## 2014-09-14 DIAGNOSIS — I10 Essential (primary) hypertension: Secondary | ICD-10-CM | POA: Diagnosis not present

## 2014-09-14 LAB — CUP PACEART REMOTE DEVICE CHECK: Date Time Interrogation Session: 20160629144407

## 2014-09-14 MED ORDER — PANTOPRAZOLE SODIUM 40 MG PO TBEC: 40.0000 mg | DELAYED_RELEASE_TABLET | Freq: Two times a day (BID) | ORAL | Status: DC

## 2014-09-14 MED ORDER — IOPAMIDOL (ISOVUE-370) INJECTION 76%
105.0000 mL | Freq: Once | INTRAVENOUS | Status: AC | PRN
  Administered 2014-09-14: 105 mL via INTRAVENOUS

## 2014-09-14 MED ORDER — ASPIRIN 81 MG PO TBEC: 81.0000 mg | DELAYED_RELEASE_TABLET | Freq: Every day | ORAL | Status: DC

## 2014-09-14 MED ORDER — ROSUVASTATIN CALCIUM 40 MG PO TABS: 40.0000 mg | ORAL_TABLET | Freq: Every day | ORAL | Status: DC

## 2014-09-14 NOTE — Telephone Encounter (Signed)
Walk in request for refills - Rxs submitted to preferred pharmacy

## 2014-09-14 NOTE — Progress Notes (Signed)
Loop recorder 

## 2014-09-15 ENCOUNTER — Ambulatory Visit: Payer: 59 | Admitting: Occupational Therapy

## 2014-09-15 ENCOUNTER — Encounter: Payer: Self-pay | Admitting: Physical Therapy

## 2014-09-15 ENCOUNTER — Ambulatory Visit: Payer: 59 | Admitting: Physical Therapy

## 2014-09-15 DIAGNOSIS — R262 Difficulty in walking, not elsewhere classified: Secondary | ICD-10-CM | POA: Diagnosis not present

## 2014-09-15 DIAGNOSIS — R531 Weakness: Secondary | ICD-10-CM

## 2014-09-15 DIAGNOSIS — R29898 Other symptoms and signs involving the musculoskeletal system: Secondary | ICD-10-CM

## 2014-09-15 DIAGNOSIS — Z7409 Other reduced mobility: Secondary | ICD-10-CM

## 2014-09-15 NOTE — Therapy (Signed)
[facility] [address] [place], [place], [address] Phone: [phone]   Fax:  [phone]  Physical Therapy Treatment  Patient Details  Name: Gregory Wiley MRN: [id number] Date of Birth: August 31, 1950 Referring Provider:  Tamsen Roers, MD  Encounter Date: 09/15/2014      PT End of Session - 09/15/14 1525    Visit Number 16   Number of Visits 17   Date for PT Re-Evaluation 09/17/14   Authorization Type UHC, Medicare will be active 11/17/14.   PT Start Time 1445   PT Stop Time 1530   PT Time Calculation (min) 45 min   Equipment Utilized During Treatment Gait belt   Activity Tolerance Patient tolerated treatment well   Behavior During Therapy Flat affect  however, he did smile more today      Past Medical History  Diagnosis Date  . Coronary artery disease     stent, then CABG 07/20/10  . MI (myocardial infarction)     NSTEMI- last cath 06/2011-Stent to LCX-DES, last nuc 07/30/11 low risk  . Diabetes mellitus     hx of, diet controlled  . Hypertension   . Hyperlipidemia   . Obesity (BMI 30-39.9)   . OSA on CPAP     since July 2013  . H/O cardiomyopathy     ischemic, now last echo 07/30/11, EF 38%W  . Diverticulosis   . Internal hemorrhoids   . Tubular adenoma of colon   . Plantar fasciitis of left foot   . Stroke     Past Surgical History  Procedure Laterality Date  . Coronary artery bypass graft  07/20/2010     LIMA-LAD; VG-ACUTE MARG of RCA; Seq VG-distal RCA & then pda  . Coronary angioplasty with stent placement  07/01/2011    DES-Resolute to native LCX  . Coronary angioplasty with stent placement  12/01/2007    COMPLEX 5 LESION PCI INCLUDING CUTTING BALLOON AND 4 CYPHER DESs  . Hernia repair  2006  . Knee surgery Right   . Hand surgery Right     to take glass out  . Colonoscopy N/A 02/10/2013    Procedure: COLONOSCOPY;  Surgeon: Ladene Artist, MD;  Location: [facility] ENDOSCOPY;  Service: Endoscopy;   Laterality: N/A;  . Retinal detachment surgery Right   . Loop recorder implant  02/15/2014    MDT LINQ implanted by Dr Rayann Heman for cryptogenic stroke  . Tee without cardioversion N/A 02/15/2014    Procedure: TRANSESOPHAGEAL ECHOCARDIOGRAM (TEE);  Surgeon: Josue Hector, MD;  Location: [facility] ENDOSCOPY;  Service: Cardiovascular;  Laterality: N/A;  . Left heart catheterization with coronary/graft angiogram  07/01/2011    Procedure: LEFT HEART CATHETERIZATION WITH Beatrix Fetters;  Surgeon: Sanda Klein, MD;  Location: [facility] CATH LAB;  Service: Cardiovascular;;  . Percutaneous coronary stent intervention (pci-s) Right 07/01/2011    Procedure: PERCUTANEOUS CORONARY STENT INTERVENTION (PCI-S);  Surgeon: Sanda Klein, MD;  Location: [facility] CATH LAB;  Service: Cardiovascular;  Laterality: Right;    There were no vitals filed for this visit.  Visit Diagnosis:  Difficulty walking  Left leg weakness  Generalized weakness  Impaired functional mobility and activity tolerance      Subjective Assessment - 09/15/14 1452    Subjective No new complaints. No falls or pain to report.    Currently in Pain? No/denies            [facility] Adult PT Treatment/Exercise - 09/15/14 1455    Transfers   Sit to Stand  4: Min guard;4: Min assist;With upper extremity assist;From chair/3-in-1;From bed;With armrests  min assist without UE support   Stand to Sit 4: Min guard;With upper extremity assist;To chair/3-in-1   Ambulation/Gait   Ambulation/Gait Yes   Ambulation/Gait Assistance 4: Min guard   Ambulation/Gait Assistance Details occasional toe scuffing, pt able to advance foot each time it got caught without assistance.   Ambulation Distance (Feet) 135 Feet  x1,    Assistive device Large base quad cane  left anterior ottobock brace/heel wedge   Gait Pattern Step-to pattern;Decreased stance time - left;Decreased step length - right;Left circumduction;Left foot flat;Decreased hip/knee flexion - left;Left  genu recurvatum   Ambulation Surface Level;Indoor   Gait velocity 57.41 sec's= 0.57 ft/sec with large based quad cane   Berg Balance Test   Sit to Stand Able to stand  independently using hands   Standing Unsupported Able to stand safely 2 minutes   Sitting with Back Unsupported but Feet Supported on Floor or Stool Able to sit safely and securely 2 minutes   Stand to Sit Sits safely with minimal use of hands   Transfers Able to transfer safely, definite need of hands  used quad cane vc hands   Standing Unsupported with Eyes Closed Able to stand 10 seconds safely   Standing Ubsupported with Feet Together Able to place feet together independently and stand 1 minute safely   From Standing, Reach Forward with Outstretched Arm Can reach forward >12 cm safely (5")  8 inches   From Standing Position, Pick up Object from Floor Able to pick up shoe, needs supervision   From Standing Position, Turn to Look Behind Over each Shoulder Looks behind one side only/other side shows less weight shift  right > left   Turn 360 Degrees Needs assistance while turning  needs large based quad cane, supervison with this   Standing Unsupported, Alternately Place Feet on Step/Stool Able to complete >2 steps/needs minimal assist  used quad cane   Standing Unsupported, One Foot in Front Able to take small step independently and hold 30 seconds   Standing on One Leg Able to lift leg independently and hold equal to or more than 3 seconds   Total Score 40     self care: CVA risk factors and warning signs reviewed with pt and spouse.        PT Short Term Goals - 08/23/14 1522    PT SHORT TERM GOAL #1   Title Pt will be independent in HEP to improve strength, balance, endurance, and safety. Target date: 08/16/14.   Status Not Met   PT SHORT TERM GOAL #2   Title Perform BERG and write goals if appropriate. Target date: 08/16/14.  pt was unable to complete full Berg today due to nausea and dizziness symptoms     Status Not Met   PT SHORT TERM GOAL #3   Title Pt will perform sit<>stand with UE assist at MOD I level to improve functional mobilty. Target date: 08/16/14.  pt partially met, requires CGA for transfers and multiple attempts   Status Not Met   PT SHORT TERM GOAL #4   Title Pt will ambulate 200' over even terrain with LRAD and supervision to improve functional mobility. Target date: 08/16/14.   Status Not Met   PT SHORT TERM GOAL #5   Title Pt will perform TUG in </=90 seconds to reduce falls risk. Target date: 08/16/14.   Status Achieved  PT Long Term Goals - 09/15/14 2102    PT LONG TERM GOAL #1   Title Pt will verbalize understanding of CVA signs/symptoms and risk factors to reduce risk of another CVA. Target date: 09/13/14.   Baseline on 6/30   Status Achieved   PT LONG TERM GOAL #2   Title Pt will report no falls over the last four weeks to improve safety during functional mobility. Target date; 09/13/14.   Baseline 6/30: pt reported a fall on his last visit   Status Not Met   PT LONG TERM GOAL #3   Title Pt will ambulate 400' over even/uneven terrain with LRAD at MOD I level to improve functional mobililty. Target date: 09/13/14.   Baseline 6/30: not met, pt walked 135 feet with LBQC with min guard to min assist.   Status Not Met   PT LONG TERM GOAL #4   Title Pt's SIS-mobility score will improve by 10% to improve quality of life. Target date: 09/13/14.   Baseline 6/30: scored 52.8% today (13.9% on eval)   Status Achieved   PT LONG TERM GOAL #5   Title Pt's gait speed will improve to >/=1.85ft/sec. with LRAD to reduce falls risk. Target date: 09/13/14.   Baseline 6/30: pt's gait speed is 0.57 ft/sec today.   Status Not Met   PT LONG TERM GOAL #6   Title Pt will improve BERG score to >/=32/56 to reduce falls risk. Target date: 09/13/14.   Baseline 6/30: scored 40/56   Status Achieved       Problem List Patient Active Problem List   Diagnosis Date Noted  .  Occipital neuralgia 09/09/2014  . Headache disorder 09/09/2014  . Bursitis of left shoulder 09/09/2014  . Dysphagia 07/26/2014  . Antiplatelet or antithrombotic long-term use 04/22/2014  . Chest pain 04/17/2014  . SOB (shortness of breath) 04/17/2014  . Bilateral pulmonary embolism 04/11/2014  . Left-sided neglect 03/09/2014  . Dysphagia, pharyngoesophageal phase 03/02/2014  . Arthralgia of left ankle 02/24/2014  . Hemiplegia affecting left dominant side 02/16/2014  . 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 Benign neoplasm of colon 02/10/2013  . Special screening for malignant neoplasms, colon 02/10/2013  . OSA on CPAP 01/06/2013  . S/P angioplasty with DES to CFX 07/01/11 07/02/2011  . Sleep apnea, "cant afford C-pap" 07/02/2011  . CAD, RCA PCI '09, 10/11, CABG X 4 5/12 06/30/2011  . Hypertension, B/P has been low this admission 06/30/2011  . Hyperlipidemia 06/30/2011  . Morbid obesity 06/30/2011  . NSTEMI, 06/29/11 06/30/2011  . 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 Ischemic cardiomyopathy, EF 35-40 2D May 2012 06/30/2011  . CHF, acute, mild 06/30/2011    Miller,Jennifer L 09/16/2014, 9:50 AM  Sallyanne Kuster, PTA, [facility] [address] [place], [place] [address] [phone] 09/16/2014, 9:50 AM   G-code and discharged performed by PT. PHYSICAL THERAPY DISCHARGE SUMMARY  Visits from Start of Care: 16  Current functional level related to goals / functional outcomes:     PT Long Term Goals - 09/15/14 2102    PT LONG TERM GOAL #1   Title Pt will verbalize understanding of CVA signs/symptoms and risk factors to reduce risk of another CVA. Target date: 09/13/14.   Baseline on 6/30   Status Achieved   PT LONG TERM GOAL #2   Title Pt will report no falls over the last four weeks to improve safety during functional mobility. Target date; 09/13/14.  Baseline 6/30: pt reported a fall on his last  visit   Status Not Met   PT LONG TERM GOAL #3   Title Pt will ambulate 400' over even/uneven terrain with LRAD at MOD I level to improve functional mobililty. Target date: 09/13/14.   Baseline 6/30: not met, pt walked 135 feet with LBQC with min guard to min assist.   Status Not Met   PT LONG TERM GOAL #4   Title Pt's SIS-mobility score will improve by 10% to improve quality of life. Target date: 09/13/14.   Baseline 6/30: scored 52.8% today (13.9% on eval)   Status Achieved   PT LONG TERM GOAL #5   Title Pt's gait speed will improve to >/=1.82ft/sec. with LRAD to reduce falls risk. Target date: 09/13/14.   Baseline 6/30: pt's gait speed is 0.57 ft/sec today.   Status Not Met   PT LONG TERM GOAL #6   Title Pt will improve BERG score to >/=32/56 to reduce falls risk. Target date: 09/13/14.   Baseline 6/30: scored 40/56   Status Achieved        Remaining deficits: Impaired balance, difficulty walking and decreased L LE strength. Pt is being discharged from PT, as he has been referred to pain clinic in order to manage pain, as this has interfered with pt's OT/PT progress.   Education / Equipment: HEP and AFO  Plan: Patient agrees to discharge.  Patient goals were partially met. Patient is being discharged due to a change in medical status.  ?????         Geoffry Paradise, PT,DPT 09/16/2014 9:52 AM Phone: [phone] Fax: [phone]

## 2014-09-15 NOTE — Telephone Encounter (Signed)
Rx(s) sent to pharmacy electronically.  

## 2014-09-16 ENCOUNTER — Encounter: Payer: 59 | Admitting: Occupational Therapy

## 2014-09-16 ENCOUNTER — Ambulatory Visit: Payer: 59

## 2014-09-16 DIAGNOSIS — R262 Difficulty in walking, not elsewhere classified: Secondary | ICD-10-CM | POA: Diagnosis not present

## 2014-09-21 ENCOUNTER — Other Ambulatory Visit: Payer: Self-pay | Admitting: *Deleted

## 2014-09-21 MED ORDER — ROSUVASTATIN CALCIUM 40 MG PO TABS: 40.0000 mg | ORAL_TABLET | Freq: Every day | ORAL | Status: DC

## 2014-09-27 ENCOUNTER — Encounter: Payer: Self-pay | Admitting: Internal Medicine

## 2014-10-04 ENCOUNTER — Encounter: Payer: Self-pay | Admitting: Family Medicine

## 2014-10-04 ENCOUNTER — Ambulatory Visit (INDEPENDENT_AMBULATORY_CARE_PROVIDER_SITE_OTHER): Payer: 59 | Admitting: Family Medicine

## 2014-10-04 VITALS — BP 104/66 | HR 79 | Temp 98.3°F | Ht 72.0 in | Wt 241.2 lb

## 2014-10-04 DIAGNOSIS — I10 Essential (primary) hypertension: Secondary | ICD-10-CM

## 2014-10-04 DIAGNOSIS — G8192 Hemiplegia, unspecified affecting left dominant side: Secondary | ICD-10-CM

## 2014-10-04 DIAGNOSIS — K5900 Constipation, unspecified: Secondary | ICD-10-CM

## 2014-10-04 NOTE — Progress Notes (Signed)
Pre visit review using our clinic review tool, if applicable. No additional management support is needed unless otherwise documented below in the visit note.  New patient to me.  H/o CAD, CVA, PE.  Now with L sided hemiplegia.  Minimal movement L foot, no functional movement of L arm.  Still swallowing w/o troubles but voice has been affected and that has limited singing at church.  No new neuro sx.  He has had f/u with neuro, cards, rehab in the meantime.  Living at home with wife now, has 1 level home with ramp.  Needs lightweight wheelchair.  He can't use a cane effectively due to minimal movement of L leg.  Can't use a walker due to no movement of L arm/hand.  He has his wife at home to move him in the wheelchair effectively.  rx given to patient at the [facility].   Lightheaded.  On low dose of BB and ACE already. Rxs prev per cards.  He hasn't been able to continue with PT/OT/ST due to lightheadedness.  He'll need refill on ACE and BB if they are continued.    Constipation.  On miralax BID but still with troubles with BMs.  H/o bleeding with hard stools prev, with prev GI note reviewed.  No new abd sx o/w.   PMH and SH reviewed  ROS: See HPI, otherwise noncontributory.  Meds, vitals, and allergies reviewed.   nad Sitting Mmm Neck supple No LA rrr ctab abd soft Ext w/o edema No movement L arm Minimal movement L leg and foot- easily overcame by MD exam and not able to bear weight effectively or at all w/o L ankle bracing.

## 2014-10-04 NOTE — Patient Instructions (Signed)
Don't change your meds for now.  Let me talk to cardiology about your BP meds.  We'll work on getting you set up with PT/OT/ST.   Take care.

## 2014-10-05 ENCOUNTER — Telehealth: Payer: Self-pay | Admitting: Family Medicine

## 2014-10-05 DIAGNOSIS — K5900 Constipation, unspecified: Secondary | ICD-10-CM | POA: Insufficient documentation

## 2014-10-05 NOTE — Assessment & Plan Note (Signed)
Now with symptomatic hypotension.  I didn't change his meds yet but will be in touch with Dr. Claiborne Billings about options.  We can make f/u plans after I get more information from Dr. Claiborne Billings.  >25 minutes spent in face to face time with patient, >50% spent in counselling or coordination of care.

## 2014-10-05 NOTE — Assessment & Plan Note (Signed)
Inc miralax to TID for now.

## 2014-10-05 NOTE — Assessment & Plan Note (Signed)
rx done for light weight wheelchair.   Will see if we can set him back up with PT/OT/ST given that he states he was discharged.  Continue other meds for now.

## 2014-10-05 NOTE — Telephone Encounter (Signed)
To Dr. Claiborne Billings for input-  Met patient for first time yesterday.  Pt now with symptomatic hypotension.  I didn't change his BB or ACE yet- on low dose of both.  I would like your input on stopping or decreasing one or there other.  He is using [facility] and needs refills on both, should they be continued.   His lightheadedness is reportedly interfering with his PT/OT, which have been discontinued in the meantime. I would like to try to restart that when the lightheadedness is improved. He was asking about f/u brain imaging- on chart review, I didn't see that this was ordered or even needed at this point.  Should you have input on this, I would appreciate it.  I am glad to see patient back, but I didn't set f/u with patient yet until I heard from you.  Many thanks.

## 2014-10-06 MED ORDER — CARVEDILOL 3.125 MG PO TABS: 3.1250 mg | ORAL_TABLET | Freq: Two times a day (BID) | ORAL | Status: DC

## 2014-10-06 MED ORDER — LISINOPRIL 2.5 MG PO TABS: 2.5000 mg | ORAL_TABLET | Freq: Every day | ORAL | Status: DC

## 2014-10-06 NOTE — Telephone Encounter (Signed)
I wanted Dr. Evette Georges input.  I sent the rxs in the meantime.

## 2014-10-06 NOTE — Telephone Encounter (Signed)
Pt's spouse called.  Patient has taken last pill of carvedilol (COREG) 3.125 MG tablet [[id number].  Please advise.  Best number to call pt is [phone].  Pharmacy of choice is [facility].

## 2014-10-10 NOTE — Telephone Encounter (Signed)
I didn't see anything from Dr. Claiborne Billings- will you please see if he is available at clinic or if another doc is covering his inbox?  Thanks.

## 2014-10-11 NOTE — Telephone Encounter (Signed)
Gregory Wiley is calling wanting to speak with a nurse about a message that Dr. Damita Dunnings sen to Dr. Claiborne Billings about the pt's medication. Please call him back   Thanks

## 2014-10-11 NOTE — Telephone Encounter (Signed)
Spoke to Nelson at Dr. Evette Georges office and was advised that Dr. Claiborne Billings is at the hospital working today, but will have someone call Dr. Damita Dunnings back shortly regarding the patient.

## 2014-10-11 NOTE — Telephone Encounter (Signed)
Dr Damita Dunnings states he would like to refer this back Dr Claiborne Billings. Dr Damita Dunnings wanted Dr Claiborne Billings to address whether to decrease or stop medication due to hypotension and lightheadness.  RN informed will defer to Dr Claiborne Billings

## 2014-10-12 NOTE — Telephone Encounter (Signed)
How low is his BP; he is on lisinopril 2.5 and coreg 3.125 dosing; if symptomatic hypotension and not bradycardic then dc lisinopril

## 2014-10-12 NOTE — Telephone Encounter (Signed)
Left message for patient to call back  

## 2014-10-12 NOTE — Telephone Encounter (Signed)
Spoke to wife Information given to her , patient is talking in the background. She states patient has not taken blood pressure since office visit with Dr Damita Dunnings. Per Dr Claiborne Billings ,discontinue lisinopril.  RN instructed wife to have patient take blood pressure 2-3 times a week and call in about 2 weeks with readings.  she verbalized understanding and states they have a blood pressure cuff at home.  Wife states patient received an email - he thought it was about a recall - wife wanted to know if RN was aware of anything. RN reviewed patient CHL CHART  - NO LETTER sent to patient.  Number given to device clinic [facility] for wife to contact. She verbalized understanding.

## 2014-10-13 ENCOUNTER — Ambulatory Visit (INDEPENDENT_AMBULATORY_CARE_PROVIDER_SITE_OTHER): Payer: 59 | Admitting: *Deleted

## 2014-10-13 DIAGNOSIS — I63411 Cerebral infarction due to embolism of right middle cerebral artery: Secondary | ICD-10-CM

## 2014-10-21 ENCOUNTER — Encounter: Payer: Self-pay | Admitting: Cardiology

## 2014-10-24 NOTE — Progress Notes (Signed)
Loop recorder 

## 2014-10-26 ENCOUNTER — Encounter: Payer: Self-pay | Admitting: Cardiology

## 2014-10-26 ENCOUNTER — Other Ambulatory Visit: Payer: Self-pay | Admitting: Neurology

## 2014-10-26 ENCOUNTER — Encounter: Payer: Self-pay | Admitting: *Deleted

## 2014-10-26 MED ORDER — METHYLPHENIDATE HCL 10 MG PO TABS: ORAL_TABLET | ORAL | Status: DC

## 2014-10-26 NOTE — Telephone Encounter (Signed)
Request entered, forwarded to provider for approval.  

## 2014-10-26 NOTE — Progress Notes (Signed)
Ritalin rx. up front [facility] 

## 2014-10-26 NOTE — Telephone Encounter (Signed)
Patient request refill methylphenidate (RITALIN) 10 MG tablet

## 2014-10-28 ENCOUNTER — Ambulatory Visit (INDEPENDENT_AMBULATORY_CARE_PROVIDER_SITE_OTHER)
Admission: RE | Admit: 2014-10-28 | Discharge: 2014-10-28 | Disposition: A | Discharge status: Home / Self Care | Payer: 59 | Source: Ambulatory Visit | Attending: Primary Care | Admitting: Primary Care

## 2014-10-28 ENCOUNTER — Ambulatory Visit (INDEPENDENT_AMBULATORY_CARE_PROVIDER_SITE_OTHER): Payer: 59 | Admitting: Primary Care

## 2014-10-28 ENCOUNTER — Encounter: Payer: Self-pay | Admitting: Primary Care

## 2014-10-28 VITALS — BP 106/70 | HR 109 | Temp 97.6°F | Wt 229.0 lb

## 2014-10-28 DIAGNOSIS — K5900 Constipation, unspecified: Secondary | ICD-10-CM

## 2014-10-28 DIAGNOSIS — R112 Nausea with vomiting, unspecified: Secondary | ICD-10-CM

## 2014-10-28 LAB — CUP PACEART REMOTE DEVICE CHECK: Date Time Interrogation Session: 20160812162055

## 2014-10-28 LAB — CBC WITH DIFFERENTIAL/PLATELET
Basophils Absolute: 0.1 10*3/uL (ref 0.0–0.1)
Basophils Relative: 0.7 % (ref 0.0–3.0)
Eosinophils Absolute: 0.2 10*3/uL (ref 0.0–0.7)
Eosinophils Relative: 2.5 % (ref 0.0–5.0)
HCT: 45.3 % (ref 39.0–52.0)
Hemoglobin: 15 g/dL (ref 13.0–17.0)
Lymphocytes Relative: 20.7 % (ref 12.0–46.0)
Lymphs Abs: 1.8 10*3/uL (ref 0.7–4.0)
MCHC: 33.1 g/dL (ref 30.0–36.0)
MCV: 86.4 fl (ref 78.0–100.0)
Monocytes Absolute: 0.6 10*3/uL (ref 0.1–1.0)
Monocytes Relative: 7 % (ref 3.0–12.0)
Neutro Abs: 5.9 10*3/uL (ref 1.4–7.7)
Neutrophils Relative %: 69.1 % (ref 43.0–77.0)
Platelets: 183 10*3/uL (ref 150.0–400.0)
RBC: 5.24 Mil/uL (ref 4.22–5.81)
RDW: 15.5 % (ref 11.5–15.5)
WBC: 8.6 10*3/uL (ref 4.0–10.5)

## 2014-10-28 LAB — COMPREHENSIVE METABOLIC PANEL
ALT: 12 U/L (ref 0–53)
AST: 18 U/L (ref 0–37)
Albumin: 3.8 g/dL (ref 3.5–5.2)
Alkaline Phosphatase: 63 U/L (ref 39–117)
BUN: 14 mg/dL (ref 6–23)
CO2: 25 mEq/L (ref 19–32)
Calcium: 9.8 mg/dL (ref 8.4–10.5)
Chloride: 97 mEq/L (ref 96–112)
Creatinine, Ser: 1.23 mg/dL (ref 0.40–1.50)
GFR: 62.79 mL/min (ref >60.00)
Glucose, Bld: 117 mg/dL — ABNORMAL HIGH (ref 70–99)
Potassium: 3.1 mEq/L — ABNORMAL LOW (ref 3.5–5.1)
Sodium: 136 mEq/L (ref 135–145)
Total Bilirubin: 0.6 mg/dL (ref 0.2–1.2)
Total Protein: 7.7 g/dL (ref 6.0–8.3)

## 2014-10-28 LAB — LIPASE: Lipase: 16 U/L (ref 11.0–59.0)

## 2014-10-28 MED ORDER — PROMETHAZINE HCL 12.5 MG PO TABS: 12.5000 mg | ORAL_TABLET | Freq: Four times a day (QID) | ORAL | Status: DC | PRN

## 2014-10-28 NOTE — Progress Notes (Signed)
Subjective:    Patient ID: Gregory Wiley, male    DOB: Feb 18, 1951, 64 y.o.   MRN: [id number]  HPI  Gregory Wiley is a 64 year old male who presents today with a chief complaint of nausea and vomiting. He also reports headache, slight dizziness, constipation. Denies fevers, diarrhea, chest pain. His symptoms have been present since last Saturday. His wife is with him today and reports he's not had a bowel movement in 2 weeks. He's been taking Miralax and Senokot. His typical bowel movements are once weekly. He will drink 1 bottle of water every three days and does not eat much since his stroke last November. His wife provided him with Zofran last night and today without relief of nausea and vomiting.  Wt Readings from Last 3 Encounters:  10/28/14 229 lb (103.874 kg)  10/04/14 241 lb 4 oz (109.43 kg)  09/09/14 251 lb 9.6 oz (114.125 kg)     Review of Systems  Constitutional: Negative for fever and chills.  Respiratory: Negative for shortness of breath.   Cardiovascular: Negative for chest pain.  Gastrointestinal: Positive for nausea, vomiting and constipation. Negative for abdominal pain and diarrhea.  Neurological: Positive for weakness.       Past Medical History  Diagnosis Date  . Coronary artery disease     stent, then CABG 07/20/10  . MI (myocardial infarction)     NSTEMI- last cath 06/2011-Stent to LCX-DES, last nuc 07/30/11 low risk  . Diabetes mellitus     hx of, diet controlled  . Hypertension   . Hyperlipidemia   . Obesity (BMI 30-39.9)   . OSA on CPAP     since July 2013  . H/O cardiomyopathy     ischemic, now last echo 07/30/11, EF 38%W  . Diverticulosis   . Internal hemorrhoids   . Tubular adenoma of colon   . Plantar fasciitis of left foot   . Stroke     Social History   Social History  . Marital Status: Married    Spouse Name: N/A  . Number of Children: 5  . Years of Education: N/A   Occupational History  .      works at the [employer] Topics  . Smoking status: Former Research scientist (life sciences)  . Smokeless tobacco: Never Used  . Alcohol Use: 0.0 oz/week    0 Standard drinks or equivalent per week     Comment: 1-2 times per year  . Drug Use: No  . Sexual Activity: Not on file   Other Topics Concern  . Not on file   Social History Narrative   Quit smoking 40 years ago   Married    Likes to play guitar and sing with church group unable since 02/12/14 stroke     Past Surgical History  Procedure Laterality Date  . Coronary artery bypass graft  07/20/2010     LIMA-LAD; VG-ACUTE MARG of RCA; Seq VG-distal RCA & then pda  . Coronary angioplasty with stent placement  07/01/2011    DES-Resolute to native LCX  . Coronary angioplasty with stent placement  12/01/2007    COMPLEX 5 LESION PCI INCLUDING CUTTING BALLOON AND 4 CYPHER DESs  . Hernia repair  2006  . Knee surgery Right   . Hand surgery Right     to take glass out  . Colonoscopy N/A 02/10/2013    Procedure: COLONOSCOPY;  Surgeon: Ladene Artist, MD;  Location: [facility] ENDOSCOPY;  Service: Endoscopy;  Laterality:  N/A;  . Retinal detachment surgery Right   . Loop recorder implant  02/15/2014    MDT LINQ implanted by Dr Rayann Heman for cryptogenic stroke  . Tee without cardioversion N/A 02/15/2014    Procedure: TRANSESOPHAGEAL ECHOCARDIOGRAM (TEE);  Surgeon: Josue Hector, MD;  Location: [facility] ENDOSCOPY;  Service: Cardiovascular;  Laterality: N/A;  . Left heart catheterization with coronary/graft angiogram  07/01/2011    Procedure: LEFT HEART CATHETERIZATION WITH Beatrix Fetters;  Surgeon: Sanda Klein, MD;  Location: [place] CATH LAB;  Service: Cardiovascular;;  . Percutaneous coronary stent intervention (pci-s) Right 07/01/2011    Procedure: PERCUTANEOUS CORONARY STENT INTERVENTION (PCI-S);  Surgeon: Sanda Klein, MD;  Location: [facility] CATH LAB;  Service: Cardiovascular;  Laterality: Right;    Family History  Problem Relation Age of Onset  . Diabetes type II Mother   . Coronary  artery disease    . Colon cancer Neg Hx   . CAD Father 10    CABG, PCI, died at 103  . Heart attack Father 44  . Hypertension Brother   . Diabetes Brother   . Hyperlipidemia Brother   . Brain cancer Maternal Grandmother   . COPD Paternal Grandfather   . Other      denies family history of DVT/PE  . Stroke Neg Hx     Allergies  Allergen Reactions  . Diphenhydramine Hcl Anaphylaxis    Current Outpatient Prescriptions on File Prior to Visit  Medication Sig Dispense Refill  . acetaminophen (TYLENOL) 325 MG tablet Take 2 tablets (650 mg total) by mouth every 6 (six) hours as needed for mild pain.    Marland Kitchen ALPRAZolam (XANAX) 0.25 MG tablet Take 0.5 tablets (0.125 mg total) by mouth every 8 (eight) hours as needed for anxiety. 45 tablet 5  . aspirin 81 MG EC tablet Take 1 tablet (81 mg total) by mouth daily. 90 tablet 1  . carvedilol (COREG) 3.125 MG tablet Take 1 tablet (3.125 mg total) by mouth 2 (two) times daily with a meal. 180 tablet 1  . colchicine 0.6 MG tablet Take 0.6 mg by mouth as needed (for gout).   0  . feeding supplement (BOOST HIGH PROTEIN) LIQD Take 1 Container by mouth as directed.    . fluticasone (FLONASE) 50 MCG/ACT nasal spray Place 2 sprays into both nostrils daily.  2  . furosemide (LASIX) 40 MG tablet Take 40 mg by mouth as needed for edema.    . gabapentin (NEURONTIN) 100 MG capsule Take 100 mg by mouth daily.    Marland Kitchen gabapentin (NEURONTIN) 300 MG capsule Take one po in am, one po in evening and two po at night (Patient taking differently: Take 300 mg by mouth at bedtime. ) 120 capsule 11  . hydrocortisone (ANUSOL-HC) 25 MG suppository Place 1 suppository (25 mg total) rectally daily. X 1 week; then, use as needed thereafter. 30 suppository 11  . meclizine (ANTIVERT) 25 MG tablet Take 1 tablet (25 mg total) by mouth 3 (three) times daily as needed for dizziness. (Patient taking differently: Take 25 mg by mouth 3 (three) times daily as needed for dizziness (bed-time). ) 30  tablet 0  . Melatonin 10 MG CAPS Take 1 capsule by mouth at bedtime.    . methylphenidate (RITALIN) 10 MG tablet Take up to 3 times a day po 90 tablet 0  . nitroGLYCERIN (NITROSTAT) 0.4 MG SL tablet Place 0.4 mg under the tongue every 5 (five) minutes as needed. For chest pain    . ondansetron [facility]  ODT) 4 MG disintegrating tablet Take 1 tablet (4 mg total) by mouth every 8 (eight) hours as needed for nausea or vomiting. 15 tablet 0  . OVER THE COUNTER MEDICATION Inhale 1 application into the lungs at bedtime. C-PAP    . pantoprazole (PROTONIX) 40 MG tablet TAKE 1 TABLET BY MOUTH 2 TIMES A DAY. 60 tablet 3  . polyethylene glycol powder (GLYCOLAX/MIRALAX) powder Take 1 Container by mouth 3 (three) times daily.    . ranolazine (RANEXA) 500 MG 12 hr tablet Take 1 tablet (500 mg total) by mouth 2 (two) times daily. 180 tablet 1  . rivaroxaban (XARELTO) 20 MG TABS tablet Take 1 tablet (20 mg total) by mouth daily with supper. 30 tablet 11  . rosuvastatin (CRESTOR) 40 MG tablet Take 1 tablet (40 mg total) by mouth daily. 90 tablet 0  . Sennosides (SENOKOT PO) Take 2 tablets by mouth daily.    Marland Kitchen tiZANidine (ZANAFLEX) 4 MG tablet One pill po tid 90 tablet 5  . Vitamin D, Ergocalciferol, (DRISDOL) 50000 UNITS CAPS capsule Take 50,000 Units by mouth every 7 (seven) days. Take on Mondays    . lisinopril (PRINIVIL,ZESTRIL) 2.5 MG tablet Take 1 tablet (2.5 mg total) by mouth daily. (Patient not taking: Reported on 10/28/2014) 90 tablet 1   No current facility-administered medications on file prior to visit.    BP 106/70 mmHg  Pulse 109  Temp(Src) 97.6 F (36.4 C) (Oral)  Wt 229 lb (103.874 kg)  SpO2 94%    Objective:   Physical Exam  Constitutional: He appears well-nourished. He appears ill.  Neck: Neck supple.  Cardiovascular: Regular rhythm.   Sinus tach at 109 during exam  Pulmonary/Chest: Effort normal and breath sounds normal.  Abdominal: Soft. Bowel sounds are normal. There is no  tenderness. There is no guarding.  Lymphadenopathy:    He has no cervical adenopathy.  Skin: Skin is warm and dry.          Assessment & Plan:  Nausea and vomiting:  Present for 1 week. No diarrhea, fevers, chills, abdominal pain. + constipation with LBM being 2 weeks ago.  Will do KUB to rule out obstruction. CBC, CMP, lipase today. RX for promethazine PRN nausea. Discussed importance of drinking water to help with hydration and bowel movement. Continue miralax and senna stool softener ER precautions provided over the weekend. Follow up if no improvement on Monday.

## 2014-10-28 NOTE — Progress Notes (Signed)
Pre visit review using our clinic review tool, if applicable. No additional management support is needed unless otherwise documented below in the visit note. 

## 2014-10-28 NOTE — Patient Instructions (Signed)
Complete lab work prior to leaving today. I will notify you of your results.  Complete xray(s) prior to leaving today. I will contact you regarding your results.  Start promethazine for nausea and vomiting. Take 1 to 2 tablets by mouth every 6 hours as needed.  You must increase your intake of water as tolerated to help with hydration and get your bowels moving.  If symptoms get worse over night, then please go to the emergency department.  Follow up if no improvement by Monday.  It was a pleasure meeting you!

## 2014-11-02 ENCOUNTER — Encounter: Payer: Self-pay | Admitting: Cardiology

## 2014-11-04 ENCOUNTER — Encounter: Payer: Self-pay | Admitting: Internal Medicine

## 2014-11-10 ENCOUNTER — Encounter: Payer: Self-pay | Admitting: Cardiology

## 2014-11-11 ENCOUNTER — Encounter (HOSPITAL_COMMUNITY): Payer: Self-pay | Admitting: *Deleted

## 2014-11-11 ENCOUNTER — Inpatient Hospital Stay (HOSPITAL_COMMUNITY)
Admission: EM | Admit: 2014-11-11 | Discharge: 2014-11-16 | DRG: 682 | Disposition: A | Discharge status: Home Health | Payer: 59 | Attending: Internal Medicine | Admitting: Internal Medicine

## 2014-11-11 ENCOUNTER — Encounter: Payer: Self-pay | Admitting: Family Medicine

## 2014-11-11 ENCOUNTER — Emergency Department (HOSPITAL_COMMUNITY): Payer: 59

## 2014-11-11 ENCOUNTER — Ambulatory Visit (INDEPENDENT_AMBULATORY_CARE_PROVIDER_SITE_OTHER): Payer: 59 | Admitting: Family Medicine

## 2014-11-11 VITALS — BP 102/60 | HR 105 | Temp 98.3°F | Wt 219.0 lb

## 2014-11-11 DIAGNOSIS — R1110 Vomiting, unspecified: Secondary | ICD-10-CM | POA: Insufficient documentation

## 2014-11-11 DIAGNOSIS — N289 Disorder of kidney and ureter, unspecified: Secondary | ICD-10-CM

## 2014-11-11 DIAGNOSIS — E860 Dehydration: Secondary | ICD-10-CM | POA: Diagnosis present

## 2014-11-11 DIAGNOSIS — R627 Adult failure to thrive: Secondary | ICD-10-CM | POA: Diagnosis present

## 2014-11-11 DIAGNOSIS — I2510 Atherosclerotic heart disease of native coronary artery without angina pectoris: Secondary | ICD-10-CM | POA: Diagnosis present

## 2014-11-11 DIAGNOSIS — M109 Gout, unspecified: Secondary | ICD-10-CM | POA: Diagnosis present

## 2014-11-11 DIAGNOSIS — K2970 Gastritis, unspecified, without bleeding: Secondary | ICD-10-CM | POA: Diagnosis present

## 2014-11-11 DIAGNOSIS — Z6830 Body mass index (BMI) 30.0-30.9, adult: Secondary | ICD-10-CM | POA: Diagnosis not present

## 2014-11-11 DIAGNOSIS — I5042 Chronic combined systolic (congestive) and diastolic (congestive) heart failure: Secondary | ICD-10-CM | POA: Diagnosis present

## 2014-11-11 DIAGNOSIS — Z7951 Long term (current) use of inhaled steroids: Secondary | ICD-10-CM

## 2014-11-11 DIAGNOSIS — E876 Hypokalemia: Secondary | ICD-10-CM | POA: Diagnosis present

## 2014-11-11 DIAGNOSIS — Z7901 Long term (current) use of anticoagulants: Secondary | ICD-10-CM

## 2014-11-11 DIAGNOSIS — Z7982 Long term (current) use of aspirin: Secondary | ICD-10-CM

## 2014-11-11 DIAGNOSIS — N179 Acute kidney failure, unspecified: Principal | ICD-10-CM | POA: Diagnosis present

## 2014-11-11 DIAGNOSIS — I255 Ischemic cardiomyopathy: Secondary | ICD-10-CM | POA: Diagnosis present

## 2014-11-11 DIAGNOSIS — Z87891 Personal history of nicotine dependence: Secondary | ICD-10-CM

## 2014-11-11 DIAGNOSIS — K219 Gastro-esophageal reflux disease without esophagitis: Secondary | ICD-10-CM | POA: Diagnosis present

## 2014-11-11 DIAGNOSIS — I252 Old myocardial infarction: Secondary | ICD-10-CM

## 2014-11-11 DIAGNOSIS — I959 Hypotension, unspecified: Secondary | ICD-10-CM | POA: Diagnosis not present

## 2014-11-11 DIAGNOSIS — G8192 Hemiplegia, unspecified affecting left dominant side: Secondary | ICD-10-CM | POA: Diagnosis present

## 2014-11-11 DIAGNOSIS — Z951 Presence of aortocoronary bypass graft: Secondary | ICD-10-CM | POA: Diagnosis not present

## 2014-11-11 DIAGNOSIS — R42 Dizziness and giddiness: Secondary | ICD-10-CM

## 2014-11-11 DIAGNOSIS — E669 Obesity, unspecified: Secondary | ICD-10-CM | POA: Diagnosis present

## 2014-11-11 DIAGNOSIS — K222 Esophageal obstruction: Secondary | ICD-10-CM | POA: Diagnosis present

## 2014-11-11 DIAGNOSIS — G4733 Obstructive sleep apnea (adult) (pediatric): Secondary | ICD-10-CM | POA: Diagnosis present

## 2014-11-11 DIAGNOSIS — R112 Nausea with vomiting, unspecified: Secondary | ICD-10-CM | POA: Diagnosis not present

## 2014-11-11 DIAGNOSIS — Z86711 Personal history of pulmonary embolism: Secondary | ICD-10-CM

## 2014-11-11 DIAGNOSIS — I10 Essential (primary) hypertension: Secondary | ICD-10-CM | POA: Diagnosis present

## 2014-11-11 DIAGNOSIS — K449 Diaphragmatic hernia without obstruction or gangrene: Secondary | ICD-10-CM | POA: Diagnosis present

## 2014-11-11 DIAGNOSIS — F1290 Cannabis use, unspecified, uncomplicated: Secondary | ICD-10-CM | POA: Diagnosis present

## 2014-11-11 DIAGNOSIS — R634 Abnormal weight loss: Secondary | ICD-10-CM | POA: Insufficient documentation

## 2014-11-11 DIAGNOSIS — Z955 Presence of coronary angioplasty implant and graft: Secondary | ICD-10-CM

## 2014-11-11 DIAGNOSIS — I63411 Cerebral infarction due to embolism of right middle cerebral artery: Secondary | ICD-10-CM | POA: Diagnosis present

## 2014-11-11 DIAGNOSIS — E43 Unspecified severe protein-calorie malnutrition: Secondary | ICD-10-CM | POA: Insufficient documentation

## 2014-11-11 DIAGNOSIS — Z85828 Personal history of other malignant neoplasm of skin: Secondary | ICD-10-CM

## 2014-11-11 DIAGNOSIS — R531 Weakness: Secondary | ICD-10-CM | POA: Diagnosis present

## 2014-11-11 DIAGNOSIS — E785 Hyperlipidemia, unspecified: Secondary | ICD-10-CM | POA: Diagnosis present

## 2014-11-11 DIAGNOSIS — I69354 Hemiplegia and hemiparesis following cerebral infarction affecting left non-dominant side: Secondary | ICD-10-CM | POA: Diagnosis not present

## 2014-11-11 DIAGNOSIS — K5900 Constipation, unspecified: Secondary | ICD-10-CM | POA: Diagnosis present

## 2014-11-11 DIAGNOSIS — Z79899 Other long term (current) drug therapy: Secondary | ICD-10-CM

## 2014-11-11 DIAGNOSIS — K5909 Other constipation: Secondary | ICD-10-CM | POA: Insufficient documentation

## 2014-11-11 DIAGNOSIS — I2699 Other pulmonary embolism without acute cor pulmonale: Secondary | ICD-10-CM | POA: Diagnosis present

## 2014-11-11 DIAGNOSIS — Z7902 Long term (current) use of antithrombotics/antiplatelets: Secondary | ICD-10-CM

## 2014-11-11 HISTORY — DX: Gastro-esophageal reflux disease without esophagitis: K21.9

## 2014-11-11 HISTORY — DX: Non-ST elevation (NSTEMI) myocardial infarction: I21.4

## 2014-11-11 HISTORY — DX: Depression, unspecified: F32.A

## 2014-11-11 HISTORY — DX: Gout, unspecified: M10.9

## 2014-11-11 HISTORY — DX: Basal cell carcinoma of skin of left lower limb, including hip: C44.719

## 2014-11-11 HISTORY — DX: Headache, unspecified: R51.9

## 2014-11-11 HISTORY — DX: Major depressive disorder, single episode, unspecified: F32.9

## 2014-11-11 HISTORY — DX: Chronic or unspecified gastric ulcer with hemorrhage: K25.4

## 2014-11-11 HISTORY — DX: Headache: R51

## 2014-11-11 HISTORY — DX: Other pulmonary embolism without acute cor pulmonale: I26.99

## 2014-11-11 LAB — COMPREHENSIVE METABOLIC PANEL
ALT: 14 U/L — ABNORMAL LOW (ref 17–63)
AST: 21 U/L (ref 15–41)
Albumin: 3.5 g/dL (ref 3.5–5.0)
Alkaline Phosphatase: 73 U/L (ref 38–126)
Anion gap: 16 — ABNORMAL HIGH (ref 5–15)
BUN: 16 mg/dL (ref 6–20)
CO2: 20 mmol/L — ABNORMAL LOW (ref 22–32)
Calcium: 9.2 mg/dL (ref 8.9–10.3)
Chloride: 96 mmol/L — ABNORMAL LOW (ref 101–111)
Creatinine, Ser: 1.65 mg/dL — ABNORMAL HIGH (ref 0.61–1.24)
GFR calc Af Amer: 49 mL/min — ABNORMAL LOW (ref >60)
GFR calc non Af Amer: 42 mL/min — ABNORMAL LOW (ref >60)
Glucose, Bld: 138 mg/dL — ABNORMAL HIGH (ref 65–99)
Potassium: 2.6 mmol/L — CL (ref 3.5–5.1)
Sodium: 132 mmol/L — ABNORMAL LOW (ref 135–145)
Total Bilirubin: 1.4 mg/dL — ABNORMAL HIGH (ref 0.3–1.2)
Total Protein: 7.6 g/dL (ref 6.5–8.1)

## 2014-11-11 LAB — BASIC METABOLIC PANEL
Anion gap: 12 (ref 5–15)
BUN: 14 mg/dL (ref 6–20)
CO2: 24 mmol/L (ref 22–32)
Calcium: 8.6 mg/dL — ABNORMAL LOW (ref 8.9–10.3)
Chloride: 98 mmol/L — ABNORMAL LOW (ref 101–111)
Creatinine, Ser: 1.46 mg/dL — ABNORMAL HIGH (ref 0.61–1.24)
GFR calc Af Amer: 57 mL/min — ABNORMAL LOW (ref >60)
GFR calc non Af Amer: 49 mL/min — ABNORMAL LOW (ref >60)
Glucose, Bld: 134 mg/dL — ABNORMAL HIGH (ref 65–99)
Potassium: 2.8 mmol/L — ABNORMAL LOW (ref 3.5–5.1)
Sodium: 134 mmol/L — ABNORMAL LOW (ref 135–145)

## 2014-11-11 LAB — MAGNESIUM: Magnesium: 2 mg/dL (ref 1.7–2.4)

## 2014-11-11 LAB — CBC
HCT: 45.2 % (ref 39.0–52.0)
Hemoglobin: 15.4 g/dL (ref 13.0–17.0)
MCH: 28.2 pg (ref 26.0–34.0)
MCHC: 34.1 g/dL (ref 30.0–36.0)
MCV: 82.8 fL (ref 78.0–100.0)
Platelets: 193 10*3/uL (ref 150–400)
RBC: 5.46 MIL/uL (ref 4.22–5.81)
RDW: 15.2 % (ref 11.5–15.5)
WBC: 10.4 10*3/uL (ref 4.0–10.5)

## 2014-11-11 LAB — CBC WITH DIFFERENTIAL/PLATELET
Basophils Absolute: 0 10*3/uL (ref 0.0–0.1)
Basophils Relative: 0 % (ref 0–1)
Eosinophils Absolute: 0.1 10*3/uL (ref 0.0–0.7)
Eosinophils Relative: 1 % (ref 0–5)
HCT: 42.9 % (ref 39.0–52.0)
Hemoglobin: 14.9 g/dL (ref 13.0–17.0)
Lymphocytes Relative: 22 % (ref 12–46)
Lymphs Abs: 1.8 10*3/uL (ref 0.7–4.0)
MCH: 29 pg (ref 26.0–34.0)
MCHC: 34.7 g/dL (ref 30.0–36.0)
MCV: 83.6 fL (ref 78.0–100.0)
Monocytes Absolute: 0.4 10*3/uL (ref 0.1–1.0)
Monocytes Relative: 4 % (ref 3–12)
Neutro Abs: 6.1 10*3/uL (ref 1.7–7.7)
Neutrophils Relative %: 73 % (ref 43–77)
Platelets: 180 10*3/uL (ref 150–400)
RBC: 5.13 MIL/uL (ref 4.22–5.81)
RDW: 15.1 % (ref 11.5–15.5)
WBC: 8.3 10*3/uL (ref 4.0–10.5)

## 2014-11-11 LAB — I-STAT CG4 LACTIC ACID, ED: Lactic Acid, Venous: 1.78 mmol/L (ref 0.5–2.0)

## 2014-11-11 LAB — PROTIME-INR
INR: 3.04 — ABNORMAL HIGH (ref 0.00–1.49)
Prothrombin Time: 30.9 seconds — ABNORMAL HIGH (ref 11.6–15.2)

## 2014-11-11 LAB — APTT: aPTT: 50 seconds — ABNORMAL HIGH (ref 24–37)

## 2014-11-11 LAB — PHOSPHORUS: Phosphorus: 2.4 mg/dL — ABNORMAL LOW (ref 2.5–4.6)

## 2014-11-11 LAB — LIPASE, BLOOD: Lipase: 21 U/L — ABNORMAL LOW (ref 22–51)

## 2014-11-11 LAB — TSH: TSH: 0.746 u[IU]/mL (ref 0.350–4.500)

## 2014-11-11 MED ORDER — ROSUVASTATIN CALCIUM 20 MG PO TABS
40.0000 mg | ORAL_TABLET | Freq: Every day | ORAL | Status: DC
  Administered 2014-11-11 – 2014-11-16 (×6): 40 mg via ORAL
  Filled 2014-11-11 (×5): qty 2
  Filled 2014-11-11: qty 4

## 2014-11-11 MED ORDER — HYDROCORTISONE ACETATE 25 MG RE SUPP: 25.0000 mg | Freq: Every day | RECTAL | Status: DC

## 2014-11-11 MED ORDER — MECLIZINE HCL 25 MG PO TABS: 25.0000 mg | ORAL_TABLET | Freq: Three times a day (TID) | ORAL | Status: DC | PRN

## 2014-11-11 MED ORDER — RIVAROXABAN 20 MG PO TABS
20.0000 mg | ORAL_TABLET | Freq: Every day | ORAL | Status: DC
  Administered 2014-11-11: 20 mg via ORAL
  Filled 2014-11-11: qty 1

## 2014-11-11 MED ORDER — PANTOPRAZOLE SODIUM 40 MG PO TBEC
40.0000 mg | DELAYED_RELEASE_TABLET | Freq: Every day | ORAL | Status: DC
  Administered 2014-11-11 – 2014-11-16 (×6): 40 mg via ORAL
  Filled 2014-11-11 (×6): qty 1

## 2014-11-11 MED ORDER — FLUTICASONE PROPIONATE 50 MCG/ACT NA SUSP
2.0000 | Freq: Every day | NASAL | Status: DC
  Administered 2014-11-12 – 2014-11-13 (×2): 2 via NASAL
  Filled 2014-11-11 (×2): qty 16

## 2014-11-11 MED ORDER — MELATONIN 10 MG PO CAPS: 1.0000 | ORAL_CAPSULE | Freq: Every day | ORAL | Status: DC

## 2014-11-11 MED ORDER — INFLUENZA VAC SPLIT QUAD 0.5 ML IM SUSY
0.5000 mL | PREFILLED_SYRINGE | INTRAMUSCULAR | Status: AC
  Administered 2014-11-12: 0.5 mL via INTRAMUSCULAR
  Filled 2014-11-11: qty 0.5

## 2014-11-11 MED ORDER — BOOST HIGH PROTEIN PO LIQD: 1.0000 | ORAL | Status: DC

## 2014-11-11 MED ORDER — SODIUM CHLORIDE 0.9 % IV SOLN
INTRAVENOUS | Status: DC
  Administered 2014-11-11 – 2014-11-16 (×5): via INTRAVENOUS

## 2014-11-11 MED ORDER — SODIUM CHLORIDE 0.9 % IV BOLUS (SEPSIS)
1000.0000 mL | Freq: Once | INTRAVENOUS | Status: AC
  Administered 2014-11-11: 1000 mL via INTRAVENOUS

## 2014-11-11 MED ORDER — SODIUM CHLORIDE 0.9 % IV BOLUS (SEPSIS)
500.0000 mL | Freq: Once | INTRAVENOUS | Status: AC
  Administered 2014-11-11: 500 mL via INTRAVENOUS

## 2014-11-11 MED ORDER — RANOLAZINE ER 500 MG PO TB12
500.0000 mg | ORAL_TABLET | Freq: Two times a day (BID) | ORAL | Status: DC
  Administered 2014-11-11 – 2014-11-13 (×4): 500 mg via ORAL
  Filled 2014-11-11 (×8): qty 1

## 2014-11-11 MED ORDER — POTASSIUM CHLORIDE CRYS ER 20 MEQ PO TBCR
40.0000 meq | EXTENDED_RELEASE_TABLET | Freq: Once | ORAL | Status: AC
  Administered 2014-11-11: 40 meq via ORAL
  Filled 2014-11-11: qty 2

## 2014-11-11 MED ORDER — ACETAMINOPHEN 325 MG PO TABS: 650.0000 mg | ORAL_TABLET | Freq: Four times a day (QID) | ORAL | Status: DC | PRN

## 2014-11-11 MED ORDER — ONDANSETRON HCL 4 MG PO TABS: 4.0000 mg | ORAL_TABLET | Freq: Four times a day (QID) | ORAL | Status: DC | PRN

## 2014-11-11 MED ORDER — COLCHICINE 0.6 MG PO TABS: 0.6000 mg | ORAL_TABLET | ORAL | Status: DC | PRN

## 2014-11-11 MED ORDER — ASPIRIN EC 81 MG PO TBEC
81.0000 mg | DELAYED_RELEASE_TABLET | Freq: Every day | ORAL | Status: DC
  Administered 2014-11-11 – 2014-11-13 (×3): 81 mg via ORAL
  Filled 2014-11-11 (×3): qty 1

## 2014-11-11 MED ORDER — ONDANSETRON HCL 4 MG/2ML IJ SOLN
4.0000 mg | Freq: Once | INTRAMUSCULAR | Status: AC
  Administered 2014-11-11: 4 mg via INTRAVENOUS
  Filled 2014-11-11: qty 2

## 2014-11-11 MED ORDER — ALPRAZOLAM 0.25 MG PO TABS: 0.1250 mg | ORAL_TABLET | Freq: Three times a day (TID) | ORAL | Status: DC | PRN

## 2014-11-11 MED ORDER — ONDANSETRON HCL 4 MG/2ML IJ SOLN: 4.0000 mg | Freq: Four times a day (QID) | INTRAMUSCULAR | Status: DC | PRN

## 2014-11-11 MED ORDER — ENSURE ENLIVE PO LIQD
237.0000 mL | Freq: Two times a day (BID) | ORAL | Status: DC
  Administered 2014-11-12: 237 mL via ORAL

## 2014-11-11 MED ORDER — VITAMIN D (ERGOCALCIFEROL) 1.25 MG (50000 UNIT) PO CAPS
50000.0000 [IU] | ORAL_CAPSULE | ORAL | Status: DC
  Administered 2014-11-16: 50000 [IU] via ORAL
  Filled 2014-11-11: qty 1

## 2014-11-11 NOTE — Patient Instructions (Signed)
Go to the ER at [facility].  I'll call ahead.  Take care.

## 2014-11-11 NOTE — H&P (Signed)
[facility] History and Physical  Gregory KNAPPENBERGER VCB:[id number] DOB: 08-Dec-1950 DOA: 11/11/2014  Referring physician: ER physician: Dr. Davonna Belling  PCP: [name], MD   Chief Complaint: weakness, womiting   HPI:  64 year old male with past medical history of hypertension, CAD status post CABG in 2012, chronic combined CHF (last 2 D ECHO in 08/2014 with EF of 40-45% and grade 1 diastolic dysfunction), dyslipidemia, CVA with residual left sided weakness who presented to [facility] ED with ongoing vomiting for past 2 week prior to this admission. He is somewhat lethargic on admission and poor historian. He apparently has seen PCP about 2 weeks prior to this admission for same problem and he thought he is getting better. His nausea and vomiting worsened since and he has very poor po intake. He has lost 20 pounds over last month or so. No blood in emesis. No fevers. No blood in stool or urine. No chest pain, palpitations, shortness of breath. No urinary complaints. No lightheadedness or loss of consciousness.   In ED, patient was hemodynamically stable. His BP was 81/63 but has improved with IV fluids to 120/60. No fevers. Blood work showed potassium of 2.6, creatinine 1.65, INR 3.04. Abd x ray showed no acute intra-abdominal findings. He was admitted for further evaluation for nausea and vomiting.   Assessment & Plan    Principal Problem:   Dehydration / Nausea and vomiting / Failure to thrive in adult / Severe protein calorie malnutrition - No acute findings on abd x ray  - provide supportive care with IV fluids, anti-emetics as needed - Obtain nutrition consult - Not sure at this point what is causing his vomiting and poor po intake, is it his previous h/o stroke or should we look for potential malignant etiology. 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 / Hemiplegia affecting left dominant side - Stable - Continue aspirin    Bilateral pulmonary embolism - Seems to have resolved based on CT angio 09/14/2014 - On AC with xarelto     Hypokalemia - Due to GI losses - Supplemented - Follow up BMP tomorrow am    Dyslipidemia - Resume statin therapy    Chronic combined systolic and diastolic congestive heart failure - Compensated - Last 2 D ECHO in 08/2014 with EF 40-45% with grade 1 diastolic dysfunction    Acute renal failure - Due to prerenal cause, GI losses - Baseline cr WNL and on this admission 1.6 - Continue IV fluids - Follow up BMP tomorrow am    DVT prophylaxis:  - On full dose anticoagulation with xarelto   Radiological Exams on Admission: Dg Abd Acute W/chest 11/11/2014   There is no active cardiopulmonary disease. There is no acute intra abdominal abnormality.   Electronically Signed   By: David  Martinique M.D.   On: 11/11/2014 14:24    Code Status: Full Family Communication: Plan of care discussed with the patient  Disposition Plan: Admit for further evaluation  Leisa Lenz, MD  [facility] Pager [phone]  Time spent in minutes: 75 minutes  Review of Systems:  Constitutional: Negative for fever, chills and malaise/fatigue. Negative for diaphoresis.  HENT: Negative for hearing loss, ear pain, nosebleeds, congestion, sore throat, neck pain,  tinnitus and ear discharge.   Eyes: Negative for blurred vision, double vision, photophobia, pain, discharge and redness.  Respiratory: Negative for cough, hemoptysis, sputum production, shortness of breath, wheezing and stridor.   Cardiovascular: Negative for chest pain, palpitations, orthopnea, claudication and leg swelling.  Gastrointestinal: Negative for nausea, vomiting and abdominal pain. Negative for heartburn, constipation, blood in stool and melena.  Genitourinary: Negative for  dysuria, urgency, frequency, hematuria and flank pain.  Musculoskeletal: Negative for myalgias, back pain, joint pain and falls.  Skin: Negative for itching and rash.  Neurological: Negative for dizziness and weakness. Negative for tingling, tremors, sensory change, speech change, focal weakness, loss of consciousness and headaches.  Endo/Heme/Allergies: Negative for environmental allergies and polydipsia. Does not bruise/bleed easily.  Psychiatric/Behavioral: Negative for suicidal ideas. The patient is not nervous/anxious.      Past Medical History  Diagnosis Date  . Coronary artery disease     stent, then CABG 07/20/10  . Hypertension   . Hyperlipidemia   . Obesity (BMI 30-39.9)   . H/O cardiomyopathy     ischemic, now last echo 07/30/11, EF 38%W  . Diverticulosis   . Internal hemorrhoids   . Tubular adenoma of colon   . Plantar fasciitis of left foot   . Basal cell carcinoma of skin of left ankle   . NSTEMI (non-ST elevated myocardial infarction)     NSTEMI- last cath 06/2011-Stent to LCX-DES, last nuc 07/30/11 low risk  . MI (myocardial infarction)     "I had 4 before I retired in 2006" (11/11/2014)  . Pulmonary embolism 03/2014  . OSA on CPAP     since July 2013  . GERD (gastroesophageal reflux disease)   . Bleeding stomach ulcer 2015  . Daily headache     "for the past month" (11/11/2014)  . Stroke 01/2014    "no control of LUE, can slightly LLE; memory problems since" (11/11/2014)  . Arthritis     "back" (11/11/2014)  . Gout   . Depression    Past Surgical History  Procedure Laterality Date  . Coronary artery bypass graft  07/20/2010     LIMA-LAD; VG-ACUTE MARG of RCA; Seq VG-distal RCA & then pda  . Coronary angioplasty with stent placement  07/01/2011    DES-Resolute to native LCX  . Coronary angioplasty with stent placement  12/01/2007    COMPLEX 5 LESION PCI INCLUDING CUTTING BALLOON AND 4 CYPHER DESs  . Knee arthroscopy Right   . Hand surgery Right     to take glass  out  . Colonoscopy N/A 02/10/2013    Procedure: COLONOSCOPY;  Surgeon: Ladene Artist, MD;  Location: [facility] ENDOSCOPY;  Service: Endoscopy;  Laterality: N/A;  . Retinal detachment surgery Right   . Loop recorder implant  02/15/2014    MDT LINQ implanted by Dr Rayann Heman for cryptogenic stroke  . Tee without cardioversion N/A 02/15/2014    Procedure: TRANSESOPHAGEAL ECHOCARDIOGRAM (TEE);  Surgeon: Josue Hector, MD;  Location: [facility] ENDOSCOPY;  Service: Cardiovascular;  Laterality: N/A;  . Left heart catheterization with coronary/graft angiogram  07/01/2011    Procedure: LEFT HEART CATHETERIZATION WITH Beatrix Fetters;  Surgeon: Sanda Klein, MD;  Location: [place] CATH LAB;  Service: Cardiovascular;;  . Percutaneous coronary stent intervention (pci-s) Right 07/01/2011    Procedure: PERCUTANEOUS CORONARY STENT INTERVENTION (PCI-S);  Surgeon: Sanda Klein, MD;  Location: [facility] CATH LAB;  Service: Cardiovascular;  Laterality: Right;  . Umbilical hernia repair  2006  . Hernia repair    . Basal  cell carcinoma excision Left 2015    ankle   Social History:  reports that he has quit smoking. His smoking use included Cigarettes. He has a 8 pack-year smoking history. He has never used smokeless tobacco. He reports that he drinks alcohol. He reports that he uses illicit drugs (Marijuana).  Allergies  Allergen Reactions  . Diphenhydramine Hcl Anaphylaxis    Family History:  Family History  Problem Relation Age of Onset  . Diabetes type II Mother   . Coronary artery disease    . Colon cancer Neg Hx   . CAD Father 11    CABG, PCI, died at 63  . Heart attack Father 84  . Hypertension Brother   . Diabetes Brother   . Hyperlipidemia Brother   . Brain cancer Maternal Grandmother   . COPD Paternal Grandfather   . Other      denies family history of DVT/PE  . Stroke Neg Hx      Prior to Admission medications   Medication Sig Start Date End Date Taking? Authorizing Provider  acetaminophen (TYLENOL)  325 MG tablet Take 2 tablets (650 mg total) by mouth every 6 (six) hours as needed for mild pain. 03/15/14  Yes Ivan Anchors Love, PA-C  ALPRAZolam (XANAX) 0.25 MG tablet Take 0.5 tablets (0.125 mg total) by mouth every 8 (eight) hours as needed for anxiety. 04/14/14  Yes Tiffany L Reed, DO  aspirin 81 MG EC tablet Take 1 tablet (81 mg total) by mouth daily. 09/14/14  Yes [name], MD  carvedilol (COREG) 3.125 MG tablet Take 1 tablet (3.125 mg total) by mouth 2 (two) times daily with a meal. 10/06/14  Yes Tonia Ghent, MD  colchicine 0.6 MG tablet Take 0.6 mg by mouth as needed (for gout).  04/09/14  Yes Historical Provider, MD  feeding supplement (BOOST HIGH PROTEIN) LIQD Take 1 Container by mouth as directed.   Yes Historical Provider, MD  fluticasone (FLONASE) 50 MCG/ACT nasal spray Place 2 sprays into both nostrils daily. 03/15/14  Yes Ivan Anchors Love, PA-C  furosemide (LASIX) 40 MG tablet Take 40 mg by mouth as needed for edema.   Yes Historical Provider, MD  hydrocortisone (ANUSOL-HC) 25 MG suppository Place 1 suppository (25 mg total) rectally daily. X 1 week; then, use as needed thereafter. 07/26/14  Yes Jerene Bears, MD  meclizine (ANTIVERT) 25 MG tablet Take 1 tablet (25 mg total) by mouth 3 (three) times daily as needed for dizziness. Patient taking differently: Take 25 mg by mouth 3 (three) times daily as needed for dizziness (bed-time).  03/15/14  Yes Ivan Anchors Love, PA-C  Melatonin 10 MG CAPS Take 1 capsule by mouth at bedtime.   Yes Historical Provider, MD  methylphenidate (RITALIN) 10 MG tablet Take up to 3 times a day po 10/26/14  Yes Britt Bottom, MD  OVER THE COUNTER MEDICATION Inhale 1 application into the lungs at bedtime. C-PAP   Yes Historical Provider, MD  pantoprazole (PROTONIX) 40 MG tablet TAKE 1 TABLET BY MOUTH 2 TIMES A DAY. 09/15/14  Yes [name], MD  polyethylene glycol powder (GLYCOLAX/MIRALAX) powder Take 1 Container by mouth 3 (three) times daily.   Yes Historical  Provider, MD  promethazine (PHENERGAN) 12.5 MG tablet Take 1-2 tablets (12.5-25 mg total) by mouth every 6 (six) hours as needed for nausea or vomiting. 10/28/14  Yes Pleas Koch, NP  ranolazine (RANEXA) 500 MG 12 hr tablet Take 1 tablet (500 mg total) by  mouth 2 (two) times daily. 05/25/14  Yes [name], MD  rivaroxaban (XARELTO) 20 MG TABS tablet Take 1 tablet (20 mg total) by mouth daily with supper. 05/05/14  Yes Britt Bottom, MD  rosuvastatin (CRESTOR) 40 MG tablet Take 1 tablet (40 mg total) by mouth daily. 09/21/14  Yes [name], MD  Sennosides (SENOKOT PO) Take 2 tablets by mouth daily.   Yes Historical Provider, MD  Vitamin D, Ergocalciferol, (DRISDOL) 50000 UNITS CAPS capsule Take 50,000 Units by mouth every 7 (seven) days. Take on Mondays   Yes Historical Provider, MD  nitroGLYCERIN (NITROSTAT) 0.4 MG SL tablet Place 0.4 mg under the tongue every 5 (five) minutes as needed. For chest pain    Historical Provider, MD   Physical Exam: Filed Vitals:   11/11/14 1500 11/11/14 1515 11/11/14 1554 11/11/14 1638  BP: 99/66 92/61 99/64  90/56  Pulse: 77 71 83 78  Temp:    98.2 F (36.8 C)  TempSrc:    Oral  Resp: 16 17 16 18   Height:    6' (1.829 m)  Weight:    99.837 kg (220 lb 1.6 oz)  SpO2: 98% 95% 96% 96%    Physical Exam  Constitutional: Appears weak, ill, tired. No distress.  HENT: Normocephalic. No tonsillar erythema or exudates Eyes: Conjunctivae are normal. No scleral icterus.  Neck: Normal ROM. Neck supple. No JVD. No tracheal deviation. No thyromegaly.  CVS: RRR, S1/S2 +, no murmurs, no gallops, no carotid bruit.  Pulmonary: Effort and breath sounds normal, no stridor, rhonchi, wheezes, rales.  Abdominal: Soft. BS +,  no distension, tenderness, rebound or guarding.  Musculoskeletal: Normal range of motion. Bilateral pitting edema noted no tenderness.  Lymphadenopathy: No lymphadenopathy noted, cervical, inguinal. Neuro: Alert. Normal reflexes, muscle tone  coordination. No focal neurologic deficits. Skin: Skin is warm and dry. No rash noted.  No erythema. No pallor.  Psychiatric: Normal mood and affect. Behavior, judgment, thought content normal.   Labs on Admission:  Basic Metabolic Panel:  Recent Labs Lab 11/11/14 1339  NA 132*  K 2.6*  CL 96*  CO2 20*  GLUCOSE 138*  BUN 16  CREATININE 1.65*  CALCIUM 9.2   Liver Function Tests:  Recent Labs Lab 11/11/14 1339  AST 21  ALT 14*  ALKPHOS 73  BILITOT 1.4*  PROT 7.6  ALBUMIN 3.5    Recent Labs Lab 11/11/14 1339  LIPASE 21*   No results for input(s): AMMONIA in the last 168 hours. CBC:  Recent Labs Lab 11/11/14 1339 11/11/14 1850  WBC 10.4 8.3  NEUTROABS  --  6.1  HGB 15.4 14.9  HCT 45.2 42.9  MCV 82.8 83.6  PLT 193 180   Cardiac Enzymes: No results for input(s): CKTOTAL, CKMB, CKMBINDEX, TROPONINI in the last 168 hours. BNP: Invalid input(s): POCBNP CBG: No results for input(s): GLUCAP in the last 168 hours.  If 7PM-7AM, please contact night-coverage [email or web address] Password [facility] 11/11/2014, 7:39 PM

## 2014-11-11 NOTE — Assessment & Plan Note (Addendum)
Needs ER eval, declines EMS. Family to take him directly to [facility] ER.  Will likely need labs and gentle IVF.  I called ahead to ER re: pending arrival.  App help of all involved.

## 2014-11-11 NOTE — ED Notes (Signed)
pts family reports nausea and decreased appetite for a month. Pt now having vomiting and reports blood in emesis. Pt reports "things going dark" when he stands up. Pt was sent for eval of hypotension and tachycardia from MDs office.

## 2014-11-11 NOTE — ED Provider Notes (Signed)
CSN: [id number]     Arrival date & time 11/11/14  1255 History   First MD Initiated Contact with Patient 11/11/14 1312     Chief Complaint  Patient presents with  . Nausea  . Tachycardia     (Consider location/radiation/quality/duration/timing/severity/associated sxs/prior Treatment) The history is provided by the patient.   patient presents with generalized weakness. Over the last month he is been unable to eat much. He will eat and then vomited he has had a decreased appetite. No fevers. He has been seen by his primary care doctor 2 weeks ago and then again today. He has had a previous stroke with left-sided weakness. No dysuria. His urine has been darker yellow however. No fevers. States he has lost around 20 pounds last month seen by primary care and sent in for tachycardia lightheadedness and dehydration..  Past Medical History  Diagnosis Date  . Coronary artery disease     stent, then CABG 07/20/10  . MI (myocardial infarction)     NSTEMI- last cath 06/2011-Stent to LCX-DES, last nuc 07/30/11 low risk  . Diabetes mellitus     hx of, diet controlled  . Hypertension   . Hyperlipidemia   . Obesity (BMI 30-39.9)   . OSA on CPAP     since July 2013  . H/O cardiomyopathy     ischemic, now last echo 07/30/11, EF 38%W  . Diverticulosis   . Internal hemorrhoids   . Tubular adenoma of colon   . Plantar fasciitis of left foot   . Stroke    Past Surgical History  Procedure Laterality Date  . Coronary artery bypass graft  07/20/2010     LIMA-LAD; VG-ACUTE MARG of RCA; Seq VG-distal RCA & then pda  . Coronary angioplasty with stent placement  07/01/2011    DES-Resolute to native LCX  . Coronary angioplasty with stent placement  12/01/2007    COMPLEX 5 LESION PCI INCLUDING CUTTING BALLOON AND 4 CYPHER DESs  . Hernia repair  2006  . Knee surgery Right   . Hand surgery Right     to take glass out  . Colonoscopy N/A 02/10/2013    Procedure: COLONOSCOPY;  Surgeon: Ladene Artist, MD;   Location: [facility] ENDOSCOPY;  Service: Endoscopy;  Laterality: N/A;  . Retinal detachment surgery Right   . Loop recorder implant  02/15/2014    MDT LINQ implanted by Dr Rayann Heman for cryptogenic stroke  . Tee without cardioversion N/A 02/15/2014    Procedure: TRANSESOPHAGEAL ECHOCARDIOGRAM (TEE);  Surgeon: Josue Hector, MD;  Location: [facility] ENDOSCOPY;  Service: Cardiovascular;  Laterality: N/A;  . Left heart catheterization with coronary/graft angiogram  07/01/2011    Procedure: LEFT HEART CATHETERIZATION WITH Beatrix Fetters;  Surgeon: Sanda Klein, MD;  Location: [facility] CATH LAB;  Service: Cardiovascular;;  . Percutaneous coronary stent intervention (pci-s) Right 07/01/2011    Procedure: PERCUTANEOUS CORONARY STENT INTERVENTION (PCI-S);  Surgeon: Sanda Klein, MD;  Location: [facility] CATH LAB;  Service: Cardiovascular;  Laterality: Right;   Family History  Problem Relation Age of Onset  . Diabetes type II Mother   . Coronary artery disease    . Colon cancer Neg Hx   . CAD Father 6    CABG, PCI, died at 20  . Heart attack Father 63  . Hypertension Brother   . Diabetes Brother   . Hyperlipidemia Brother   . Brain cancer Maternal Grandmother   . COPD Paternal Grandfather   . Other      denies family history  of DVT/PE  . Stroke Neg Hx    Social History  Substance Use Topics  . Smoking status: Former Research scientist (life sciences)  . Smokeless tobacco: Never Used  . Alcohol Use: 0.0 oz/week    0 Standard drinks or equivalent per week     Comment: 1-2 times per year    Review of Systems  Constitutional: Positive for appetite change, fatigue and unexpected weight change. Negative for activity change.  Eyes: Negative for pain.  Respiratory: Negative for chest tightness and shortness of breath.   Cardiovascular: Negative for chest pain and leg swelling.  Gastrointestinal: Positive for nausea, vomiting and abdominal pain. Negative for diarrhea.  Musculoskeletal: Negative for back pain and neck stiffness.  Skin:  Negative for rash and wound.  Neurological: Positive for weakness and light-headedness. Negative for numbness and headaches.  Psychiatric/Behavioral: Negative for behavioral problems.      Allergies  Diphenhydramine hcl  Home Medications   Prior to Admission medications   Medication Sig Start Date End Date Taking? Authorizing Provider  acetaminophen (TYLENOL) 325 MG tablet Take 2 tablets (650 mg total) by mouth every 6 (six) hours as needed for mild pain. 03/15/14  Yes Ivan Anchors Love, PA-C  ALPRAZolam (XANAX) 0.25 MG tablet Take 0.5 tablets (0.125 mg total) by mouth every 8 (eight) hours as needed for anxiety. 04/14/14  Yes Tiffany L Reed, DO  aspirin 81 MG EC tablet Take 1 tablet (81 mg total) by mouth daily. 09/14/14  Yes [name], MD  carvedilol (COREG) 3.125 MG tablet Take 1 tablet (3.125 mg total) by mouth 2 (two) times daily with a meal. 10/06/14  Yes Tonia Ghent, MD  colchicine 0.6 MG tablet Take 0.6 mg by mouth as needed (for gout).  04/09/14  Yes Historical Provider, MD  feeding supplement (BOOST HIGH PROTEIN) LIQD Take 1 Container by mouth as directed.   Yes Historical Provider, MD  fluticasone (FLONASE) 50 MCG/ACT nasal spray Place 2 sprays into both nostrils daily. 03/15/14  Yes Ivan Anchors Love, PA-C  furosemide (LASIX) 40 MG tablet Take 40 mg by mouth as needed for edema.   Yes Historical Provider, MD  hydrocortisone (ANUSOL-HC) 25 MG suppository Place 1 suppository (25 mg total) rectally daily. X 1 week; then, use as needed thereafter. 07/26/14  Yes Jerene Bears, MD  meclizine (ANTIVERT) 25 MG tablet Take 1 tablet (25 mg total) by mouth 3 (three) times daily as needed for dizziness. Patient taking differently: Take 25 mg by mouth 3 (three) times daily as needed for dizziness (bed-time).  03/15/14  Yes Ivan Anchors Love, PA-C  Melatonin 10 MG CAPS Take 1 capsule by mouth at bedtime.   Yes Historical Provider, MD  methylphenidate (RITALIN) 10 MG tablet Take up to 3 times a day po  10/26/14  Yes Britt Bottom, MD  OVER THE COUNTER MEDICATION Inhale 1 application into the lungs at bedtime. C-PAP   Yes Historical Provider, MD  pantoprazole (PROTONIX) 40 MG tablet TAKE 1 TABLET BY MOUTH 2 TIMES A DAY. 09/15/14  Yes [name], MD  polyethylene glycol powder (GLYCOLAX/MIRALAX) powder Take 1 Container by mouth 3 (three) times daily.   Yes Historical Provider, MD  promethazine (PHENERGAN) 12.5 MG tablet Take 1-2 tablets (12.5-25 mg total) by mouth every 6 (six) hours as needed for nausea or vomiting. 10/28/14  Yes Pleas Koch, NP  ranolazine (RANEXA) 500 MG 12 hr tablet Take 1 tablet (500 mg total) by mouth 2 (two) times daily. 05/25/14  Yes  [name], MD  rivaroxaban (XARELTO) 20 MG TABS tablet Take 1 tablet (20 mg total) by mouth daily with supper. 05/05/14  Yes Britt Bottom, MD  rosuvastatin (CRESTOR) 40 MG tablet Take 1 tablet (40 mg total) by mouth daily. 09/21/14  Yes [name], MD  Sennosides (SENOKOT PO) Take 2 tablets by mouth daily.   Yes Historical Provider, MD  Vitamin D, Ergocalciferol, (DRISDOL) 50000 UNITS CAPS capsule Take 50,000 Units by mouth every 7 (seven) days. Take on Mondays   Yes Historical Provider, MD  nitroGLYCERIN (NITROSTAT) 0.4 MG SL tablet Place 0.4 mg under the tongue every 5 (five) minutes as needed. For chest pain    Historical Provider, MD   BP 99/64 mmHg  Pulse 83  Temp(Src) 97.7 F (36.5 C) (Oral)  Resp 16  SpO2 96% Physical Exam  Constitutional: He is oriented to person, place, and time. He appears well-developed.  HENT:  Head: Normocephalic.  Mucous membranes are dry  Neck: Neck supple.  Cardiovascular: Normal rate.   Pulmonary/Chest: Effort normal.  Abdominal: There is no tenderness.  Musculoskeletal: He exhibits edema.  Mild bilateral lower extremity pitting edema  Neurological: He is alert and oriented to person, place, and time.  Chronic left sided weakness   Skin: Skin is warm.    ED Course  Procedures  (including critical care time) Labs Review Labs Reviewed  LIPASE, BLOOD - Abnormal; Notable for the following:    Lipase 21 (*)    All other components within normal limits  COMPREHENSIVE METABOLIC PANEL - Abnormal; Notable for the following:    Sodium 132 (*)    Potassium 2.6 (*)    Chloride 96 (*)    CO2 20 (*)    Glucose, Bld 138 (*)    Creatinine, Ser 1.65 (*)    ALT 14 (*)    Total Bilirubin 1.4 (*)    GFR calc non Af Amer 42 (*)    GFR calc Af Amer 49 (*)    Anion gap 16 (*)    All other components within normal limits  CBC  I-STAT CG4 LACTIC ACID, ED  I-STAT CG4 LACTIC ACID, ED    Imaging Review Dg Abd Acute W/chest  11/11/2014   CLINICAL DATA:  Patient reports vomiting when trying to stand, duration of symptoms for the past month, history of diverticulosis, tubular adenoma of the colon, and diabetes, also history of coronary artery disease and previous MI and CABG.  EXAM: DG ABDOMEN ACUTE W/ 1V CHEST  COMPARISON:  KUB of October 28, 2014 and PA and lateral chest x-ray of June 21, 2014.  FINDINGS: Chest x-ray: The lungs are adequately inflated. There is stable density overlying the left cardiac apex likely reflecting atelectasis or scarring. The heart is top-normal in size. The pulmonary vascularity is normal. There are 6 sternal wires. The next the lowest wire is fractured. This is stable. There is an implantable cardiac rhythm recording device present on the left.  Within the abdomen the gas and stool pattern is unremarkable. There are no abnormal soft tissue calcifications. There are phleboliths in the pelvis. The bony structures are unremarkable.  IMPRESSION: There is no active cardiopulmonary disease. There is no acute intra abdominal abnormality.   Electronically Signed   By: David  Martinique M.D.   On: 11/11/2014 14:24   I have personally reviewed and evaluated these images and lab results as part of my medical decision-making.   EKG Interpretation None      MDM  Final  diagnoses:  Dehydration  Renal insufficiency    Patient with generalized weakness and decreased oral intake. Has had nausea and tachycardia. Seen by PCP twice for the same. Has increased creatinine and hypokalemia. Will admit to internal medicine.    Davonna Belling, MD 11/11/14 [phone]

## 2014-11-11 NOTE — Procedures (Signed)
Pt placed on hospital CPAP with auto mode setting of max-15, min-5.  Pt is tolerating well and resting comfortably.

## 2014-11-11 NOTE — ED Notes (Signed)
Attempted report x1. 

## 2014-11-11 NOTE — Progress Notes (Signed)
Pre visit review using our clinic review tool, if applicable. No additional management support is needed unless otherwise documented below in the visit note.  3 weeks of vomiting.  BM on 11/07/14- usually 1 BM per week.  Zofran didn't help.  Promethazine didn't help much.  No fevers.  No blood in stool. No abd pain.  He has had some rusty colored/blood streaked vomitus.   Off tizanidine and gabapentin for the last 3 weeks.  If he stands up, he'll get persyncopal.  Tachycardia noted.    Last ate a normal meal and held it down about 1 month ago. Last UOP was this AM, minimal amount, a few mLs, dark.    PMH and SH reviewed  ROS: See HPI, otherwise noncontributory.  Meds, vitals, and allergies reviewed.   In wheelchair Speech and L sided paralysis at baseline Mmm but that was right after vomiting.  Neck supply Tachy, sounds to be RRR o/w ctab abd soft, not ttp

## 2014-11-12 ENCOUNTER — Encounter (HOSPITAL_COMMUNITY): Payer: Self-pay | Admitting: Internal Medicine

## 2014-11-12 DIAGNOSIS — E43 Unspecified severe protein-calorie malnutrition: Secondary | ICD-10-CM | POA: Insufficient documentation

## 2014-11-12 DIAGNOSIS — K5909 Other constipation: Secondary | ICD-10-CM | POA: Insufficient documentation

## 2014-11-12 DIAGNOSIS — G43A0 Cyclical vomiting, not intractable: Secondary | ICD-10-CM

## 2014-11-12 DIAGNOSIS — E876 Hypokalemia: Secondary | ICD-10-CM

## 2014-11-12 DIAGNOSIS — R112 Nausea with vomiting, unspecified: Secondary | ICD-10-CM

## 2014-11-12 DIAGNOSIS — N179 Acute kidney failure, unspecified: Principal | ICD-10-CM

## 2014-11-12 DIAGNOSIS — I5042 Chronic combined systolic (congestive) and diastolic (congestive) heart failure: Secondary | ICD-10-CM

## 2014-11-12 DIAGNOSIS — K5900 Constipation, unspecified: Secondary | ICD-10-CM

## 2014-11-12 DIAGNOSIS — I2699 Other pulmonary embolism without acute cor pulmonale: Secondary | ICD-10-CM

## 2014-11-12 DIAGNOSIS — R1110 Vomiting, unspecified: Secondary | ICD-10-CM | POA: Insufficient documentation

## 2014-11-12 DIAGNOSIS — R634 Abnormal weight loss: Secondary | ICD-10-CM | POA: Insufficient documentation

## 2014-11-12 LAB — CBC
HCT: 41.1 % (ref 39.0–52.0)
Hemoglobin: 14.1 g/dL (ref 13.0–17.0)
MCH: 28.4 pg (ref 26.0–34.0)
MCHC: 34.3 g/dL (ref 30.0–36.0)
MCV: 82.9 fL (ref 78.0–100.0)
Platelets: 155 10*3/uL (ref 150–400)
RBC: 4.96 MIL/uL (ref 4.22–5.81)
RDW: 15.2 % (ref 11.5–15.5)
WBC: 7.1 10*3/uL (ref 4.0–10.5)

## 2014-11-12 LAB — APTT: aPTT: 45 seconds — ABNORMAL HIGH (ref 24–37)

## 2014-11-12 LAB — BASIC METABOLIC PANEL
Anion gap: 10 (ref 5–15)
BUN: 13 mg/dL (ref 6–20)
CO2: 23 mmol/L (ref 22–32)
Calcium: 8.6 mg/dL — ABNORMAL LOW (ref 8.9–10.3)
Chloride: 102 mmol/L (ref 101–111)
Creatinine, Ser: 1.27 mg/dL — ABNORMAL HIGH (ref 0.61–1.24)
GFR calc Af Amer: >60 mL/min (ref >60)
GFR calc non Af Amer: 58 mL/min — ABNORMAL LOW (ref >60)
Glucose, Bld: 98 mg/dL (ref 65–99)
Potassium: 2.7 mmol/L — CL (ref 3.5–5.1)
Sodium: 135 mmol/L (ref 135–145)

## 2014-11-12 LAB — HEPARIN LEVEL (UNFRACTIONATED): Heparin Unfractionated: 1.72 IU/mL — ABNORMAL HIGH (ref 0.30–0.70)

## 2014-11-12 MED ORDER — HEPARIN (PORCINE) IN NACL 100-0.45 UNIT/ML-% IJ SOLN
1650.0000 [IU]/h | INTRAMUSCULAR | Status: DC
  Administered 2014-11-12: 1650 [IU]/h via INTRAVENOUS
  Filled 2014-11-12: qty 250

## 2014-11-12 MED ORDER — POTASSIUM CHLORIDE 10 MEQ/100ML IV SOLN
10.0000 meq | INTRAVENOUS | Status: AC
  Administered 2014-11-12 (×5): 10 meq via INTRAVENOUS
  Filled 2014-11-12 (×5): qty 100

## 2014-11-12 NOTE — Progress Notes (Signed)
Patient refused AM CBG. 11/12/2014 8:10 AM Dracen Reigle

## 2014-11-12 NOTE — Progress Notes (Signed)
Utilization review completed.  

## 2014-11-12 NOTE — Progress Notes (Signed)
Placed pt on cpap at this time

## 2014-11-12 NOTE — Progress Notes (Signed)
Initial Nutrition Assessment  DOCUMENTATION CODES:  Severe malnutrition in context of chronic illness  INTERVENTION:  Ensure Enlive po BID, each supplement provides 350 kcal and 20 grams of protein  Recommend MVI with minerals in light of prolonged poor intake  NUTRITION DIAGNOSIS:  Inadequate oral intake related to poor appetite, vomiting, nausea as evidenced by loss 9% bw in 1 month and 36% bw in <10 months  GOAL:  Patient will meet greater than or equal to 90% of their needs  MONITOR:  PO intake, Supplement acceptance, Weight trends, Labs, I & O's , Work up  REASON FOR ASSESSMENT:  Malnutrition Screening Tool, Consult Diet education  ASSESSMENT:  64 y/o PMHx HTN, CAD s/p CABG, CHF, dyslipidemia, CVA with residual left sided weakness who presented to [facility] ED with vomiting for 2 weeks PTA. He has very poor po intake. He has lost 20 pounds over last month or so.  Was consulted for diet education, but per consulting MD's note, it would appear consult more directed to assessment of status/poor PO intake. Pt has had severe wt loss of unknown etiology.   Pt states that he has had severe n/v recently. He has no appetite. He has only been eating ~1 meal a day which is far from normal for him. He did have 1 Boost supplement each morning, took Vit D weekly and did not follow any sort of diet.   He had a stroke in November, Since that time he has lost 347 lbs. He had swallowing difficulties and was on thickeners. He says that he no longer has trouble swallowing, but he has had severe taste changes since using the thickeners and now "nothing tastes right".   Since he has been admitted he states he is feeling better. Appetite has slightly improved. He is taking miralax for constipation.   NFPE: Mild orbital fat wasting.   Diet Order:  Diet regular Room service appropriate?: Yes; Fluid consistency:: Thin  Skin:  Reddened Sacral Area  Last BM:  8/22  Height:  Ht Readings from Last 1  Encounters:  11/11/14 6' (1.829 m)   Weight:  Wt Readings from Last 1 Encounters:  11/11/14 220 lb 1.6 oz (99.837 kg)   Wt Readings from Last 10 Encounters:  11/11/14 220 lb 1.6 oz (99.837 kg)  11/11/14 219 lb (99.338 kg)  10/28/14 229 lb (103.874 kg)  10/04/14 241 lb 4 oz (109.43 kg)  09/09/14 251 lb 9.6 oz (114.125 kg)  08/18/14 257 lb (116.574 kg)  07/26/14 266 lb 3.2 oz (120.748 kg)  06/17/14 280 lb 9.6 oz (127.279 kg)  05/12/14 297 lb (134.718 kg)  05/03/14 300 lb 6.4 oz (136.261 kg)  Admit weight: 219 lbs  Ideal Body Weight:  81 kg  BMI:  Body mass index is 29.84 kg/(m^2).  Estimated Nutritional Needs:  Kcal:  1700-1900 (17-19 kcal/kg) Protein:  73-89 g (.9-1.1 g/kg bw) Fluid:  1.7-1.9 liters  EDUCATION NEEDS:  No education needs identified at this time  Burtis Junes RD, LDN Nutrition Pager: [phone] 11/12/2014 11:01 AM

## 2014-11-12 NOTE — Progress Notes (Signed)
PATIENT DETAILS Name: Gregory Wiley Age: 64 y.o. Sex: male Date of Birth: Oct 19, 1950 Admit Date: 11/11/2014 Admitting Physician Robbie Lis, MD [id number] Damita Dunnings, MD  Subjective: Feels better-no vomiting since admission.   Assessment/Plan: Principal Problem: Nausea/vomiting: Ongoing for 1 month, but none since hospitalization. Reviewed prior EGD done in 2015, patient had a gastric ulcer-recommendations were to repeat EGD. Have consulted GI.  Active Problems: Hypokalemia: Secondary to GI loss: We'll replete and recheck.  Acute renal failure: Likely secondary to prerenal azotemia, continue to cautiously hydrate and recheck electrolytes in a.m.  History of CAD: Without chest pain or shortness of breath. Continue aspirin, statin  History of pulmonary embolism: In anticipation of possible EGD-stop Xarelto, place on heparin infusion. Resume Xarelto when EGD completed.  History of CVA with residual left-sided hemiplegia: Continue ASA  Dyslipidemia: Continue statin  History of hypertension: Although BP improved-still soft-continue to hold Coreg and Lasix  Chronic systolic and diastolic heart failure: Clinically compensated, which closely while on IV fluids. Resume beta blocker and Lasix.  Protein-calorie malnutrition, severe: Continue supplements  Disposition: Remain inpatient  Antimicrobial agents  See below  Anti-infectives    None      DVT Prophylaxis: Xarelto to Heparin gtt  Code Status: Full code  Family Communication None at bedside  Procedures: None  CONSULTS:  GI  Time spent 40 minutes-Greater than 50% of this time was spent in counseling, explanation of diagnosis, planning of further management, and coordination of care.  MEDICATIONS: Scheduled Meds: . aspirin EC  81 mg Oral Daily  . feeding supplement (ENSURE ENLIVE)  237 mL Oral BID BM  . fluticasone  2 spray Each Nare Daily  . hydrocortisone  25 mg Rectal Daily  .  pantoprazole  40 mg Oral Daily  . potassium chloride  10 mEq Intravenous Q1 Hr x 5  . ranolazine  500 mg Oral BID  . rosuvastatin  40 mg Oral Daily  . [START ON 11/16/2014] Vitamin D (Ergocalciferol)  50,000 Units Oral Q7 days   Continuous Infusions: . sodium chloride 75 mL/hr at 11/12/14 0643   PRN Meds:.acetaminophen, ALPRAZolam, colchicine, meclizine, ondansetron **OR** ondansetron (ZOFRAN) IV    PHYSICAL EXAM: Vital signs in last 24 hours: Filed Vitals:   11/11/14 1554 11/11/14 1638 11/11/14 2222 11/12/14 0628  BP: 99/64 90/56 81/47  93/60  Pulse: 83 78 81 73  Temp:  98.2 F (36.8 C) 98.1 F (36.7 C) 98.3 F (36.8 C)  TempSrc:  Oral Oral Oral  Resp: 16 18 15 15   Height:  6' (1.829 m)    Weight:  99.837 kg (220 lb 1.6 oz)    SpO2: 96% 96% 97% 97%    Weight change:  Filed Weights   11/11/14 1638  Weight: 99.837 kg (220 lb 1.6 oz)   Body mass index is 29.84 kg/(m^2).   Gen Exam: Awake and alert with clear speech.  Neck: Supple, No JVD.   Chest: B/L Clear.   CVS: S1 S2 Regular, no murmurs.  Abdomen: soft, BS +, non tender, non distended.  Extremities: no edema, lower extremities warm to touch. Neurologic: Left hemiplegia   Skin: No Rash.   Wounds: N/A.   Intake/Output from previous day:  Intake/Output Summary (Last 24 hours) at 11/12/14 1244 Last data filed at 11/12/14 1013  Gross per 24 hour  Intake   2570 ml  Output    375 ml  Net   2195  ml     LAB RESULTS: CBC  Recent Labs Lab 11/11/14 1339 11/11/14 1850 11/12/14 0907  WBC 10.4 8.3 7.1  HGB 15.4 14.9 14.1  HCT 45.2 42.9 41.1  PLT 193 180 155  MCV 82.8 83.6 82.9  MCH 28.2 29.0 28.4  MCHC 34.1 34.7 34.3  RDW 15.2 15.1 15.2  LYMPHSABS  --  1.8  --   MONOABS  --  0.4  --   EOSABS  --  0.1  --   BASOSABS  --  0.0  --     Chemistries   Recent Labs Lab 11/11/14 1339 11/11/14 1850 11/12/14 0907  NA 132* 134* 135  K 2.6* 2.8* 2.7*  CL 96* 98* 102  CO2 20* 24 23  GLUCOSE 138* 134* 98    BUN 16 14 13   CREATININE 1.65* 1.46* 1.27*  CALCIUM 9.2 8.6* 8.6*  MG  --  2.0  --     CBG: No results for input(s): GLUCAP in the last 168 hours.  GFR Estimated Creatinine Clearance: 71.9 mL/min (by C-G formula based on Cr of 1.27).  Coagulation profile  Recent Labs Lab 11/11/14 1850  INR 3.04*    Cardiac Enzymes No results for input(s): CKMB, TROPONINI, MYOGLOBIN in the last 168 hours.  Invalid input(s): CK  Invalid input(s): POCBNP No results for input(s): DDIMER in the last 72 hours. No results for input(s): HGBA1C in the last 72 hours. No results for input(s): CHOL, HDL, LDLCALC, TRIG, CHOLHDL, LDLDIRECT in the last 72 hours.  Recent Labs  11/11/14 1850  TSH 0.746   No results for input(s): VITAMINB12, FOLATE, FERRITIN, TIBC, IRON, RETICCTPCT in the last 72 hours.  Recent Labs  11/11/14 1339  LIPASE 21*    Urine Studies No results for input(s): UHGB, CRYS in the last 72 hours.  Invalid input(s): UACOL, UAPR, USPG, UPH, UTP, UGL, UKET, UBIL, UNIT, UROB, ULEU, UEPI, UWBC, URBC, UBAC, CAST, UCOM, BILUA  MICROBIOLOGY: No results found for this or any previous visit (from the past 240 hour(s)).  RADIOLOGY STUDIES/RESULTS: Dg Abd 1 View  10/28/2014   CLINICAL DATA:  No bowel movement for the past 2 weeks, nausea and vomiting now  EXAM: ABDOMEN - 1 VIEW  COMPARISON:  None in PACs  FINDINGS: The colonic stool burden is increased diffusely. There is a moderate amount of gas within bowel in the mid abdomen, likely the sigmoid. There is no small bowel obstructive pattern. There are numerous pelvic phleboliths. No acute bony abnormality is observed.  IMPRESSION: Increase colonic stool burden likely reflects clinical constipation. There is no evidence of obstruction or perforation.   Electronically Signed   By: David  Martinique M.D.   On: 10/28/2014 11:49   Dg Abd Acute W/chest  11/11/2014   CLINICAL DATA:  Patient reports vomiting when trying to stand, duration of  symptoms for the past month, history of diverticulosis, tubular adenoma of the colon, and diabetes, also history of coronary artery disease and previous MI and CABG.  EXAM: DG ABDOMEN ACUTE W/ 1V CHEST  COMPARISON:  KUB of October 28, 2014 and PA and lateral chest x-ray of June 21, 2014.  FINDINGS: Chest x-ray: The lungs are adequately inflated. There is stable density overlying the left cardiac apex likely reflecting atelectasis or scarring. The heart is top-normal in size. The pulmonary vascularity is normal. There are 6 sternal wires. The next the lowest wire is fractured. This is stable. There is an implantable cardiac rhythm recording device present on the left.  Within the abdomen the gas and stool pattern is unremarkable. There are no abnormal soft tissue calcifications. There are phleboliths in the pelvis. The bony structures are unremarkable.  IMPRESSION: There is no active cardiopulmonary disease. There is no acute intra abdominal abnormality.   Electronically Signed   By: David  Martinique M.D.   On: 11/11/2014 14:24    Oren Binet, MD  [facility] Pager:[phone]  If 7PM-7AM, please contact night-coverage [email or web address] Password [email or web address] 11/12/2014, 12:44 PM   LOS: 1 day

## 2014-11-12 NOTE — Consult Note (Signed)
Consultation  Referring Provider:     Nigel Bridgeman Primary Care Physician:  [name], MD Primary Gastroenterologist:       Fuller Plan  Reason for Consultation:     Chronic nausea and vomiting     Impression / Plan:   Chronic nausea and vomiting Hx antral ulcer 2015 Vertigo Posterior headaches Anticoagulated after PE, Eliquis, INR elevated - may not mean anything - has deccreased renal fx also Hx stroke - left hemiplegia Constipation Weight loss  EGD - likely Monday after xarelto out of system The risks and benefits as well as alternatives of endoscopic procedure(s) have been discussed and reviewed. All questions answered. The patient agrees to proceed.  Some need to f/u antral ulcer   Evaluate vertigo more?  Heparin tx  Daily Miralax not prn to treat constipation  D/w dr. Nigel Bridgeman          HPI:   Gregory Wiley is a 64 y.o. male with hx of a medium antral ulcer 2015 EGD, neg bxs - had melena before. Since then he had a right MCA stroke, and pulmonary embolus. He reports anorexia, chronic dizziness/vertigo, headaches and nausea and vomiting also. Worsened lately and weaker so admitted. Was seen in the PCP office 2 weeks ago. Other problems include constipation, 100# wgt loss since stroke, .  Past Medical History  Diagnosis Date  . Coronary artery disease     stent, then CABG 07/20/10  . Hypertension   . Hyperlipidemia   . Obesity (BMI 30-39.9)   . H/O cardiomyopathy     ischemic, now last echo 07/30/11, EF 38%W  . Diverticulosis   . Internal hemorrhoids   . Tubular adenoma of colon   . Plantar fasciitis of left foot   . Basal cell carcinoma of skin of left ankle   . NSTEMI (non-ST elevated myocardial infarction)     NSTEMI- last cath 06/2011-Stent to LCX-DES, last nuc 07/30/11 low risk  . MI (myocardial infarction)     "I had 4 before I retired in 2006" (11/11/2014)  . Pulmonary embolism 03/2014  . OSA on CPAP     since July 2013  . GERD (gastroesophageal reflux  disease)   . Bleeding stomach ulcer 2015  . Daily headache     "for the past month" (11/11/2014)  . Stroke 01/2014    "no control of LUE, can slightly LLE; memory problems since" (11/11/2014)  . Arthritis     "back" (11/11/2014)  . Gout   . Depression     Past Surgical History  Procedure Laterality Date  . Coronary artery bypass graft  07/20/2010     LIMA-LAD; VG-ACUTE MARG of RCA; Seq VG-distal RCA & then pda  . Coronary angioplasty with stent placement  07/01/2011    DES-Resolute to native LCX  . Coronary angioplasty with stent placement  12/01/2007    COMPLEX 5 LESION PCI INCLUDING CUTTING BALLOON AND 4 CYPHER DESs  . Knee arthroscopy Right   . Hand surgery Right     to take glass out  . Colonoscopy N/A 02/10/2013    Procedure: COLONOSCOPY;  Surgeon: Ladene Artist, MD;  Location: [facility];  Service: Endoscopy;  Laterality: N/A;  . Retinal detachment surgery Right   . Loop recorder implant  02/15/2014    MDT LINQ implanted by Dr Rayann Heman for cryptogenic stroke  . Tee without cardioversion N/A 02/15/2014    Procedure: TRANSESOPHAGEAL ECHOCARDIOGRAM (TEE);  Surgeon: Josue Hector, MD;  Location: [facility];  Service:  Cardiovascular;  Laterality: N/A;  . Left heart catheterization with coronary/graft angiogram  07/01/2011    Procedure: LEFT HEART CATHETERIZATION WITH Beatrix Fetters;  Surgeon: Sanda Klein, MD;  Location: [facility] CATH LAB;  Service: Cardiovascular;;  . Percutaneous coronary stent intervention (pci-s) Right 07/01/2011    Procedure: PERCUTANEOUS CORONARY STENT INTERVENTION (PCI-S);  Surgeon: Sanda Klein, MD;  Location: [facility] CATH LAB;  Service: Cardiovascular;  Laterality: Right;  . Umbilical hernia repair  2006  . Hernia repair    . Basal cell carcinoma excision Left 2015    ankle  . Esophagogastroduodenoscopy  2015    Family History  Problem Relation Age of Onset  . Diabetes type II Mother   . Coronary artery disease    . Colon cancer Neg Hx   . CAD  Father 78    CABG, PCI, died at 69  . Heart attack Father 55  . Hypertension Brother   . Diabetes Brother   . Hyperlipidemia Brother   . Brain cancer Maternal Grandmother   . COPD Paternal Grandfather   . Other      denies family history of DVT/PE  . Stroke Neg Hx      Social History  Substance Use Topics  . Smoking status: Former Smoker -- 2.00 packs/day for 4 years    Types: Cigarettes  . Smokeless tobacco: Never Used     Comment: "quit smoking cigarettes  in the 1970's"  . Alcohol Use: 0.0 oz/week    0 Standard drinks or equivalent per week     Comment: 11/11/2014 "1-2 times per year"    Prior to Admission medications   Medication Sig Start Date End Date Taking? Authorizing Provider  acetaminophen (TYLENOL) 325 MG tablet Take 2 tablets (650 mg total) by mouth every 6 (six) hours as needed for mild pain. 03/15/14  Yes Ivan Anchors Love, PA-C  ALPRAZolam (XANAX) 0.25 MG tablet Take 0.5 tablets (0.125 mg total) by mouth every 8 (eight) hours as needed for anxiety. 04/14/14  Yes Tiffany L Reed, DO  aspirin 81 MG EC tablet Take 1 tablet (81 mg total) by mouth daily. 09/14/14  Yes [name], MD  carvedilol (COREG) 3.125 MG tablet Take 1 tablet (3.125 mg total) by mouth 2 (two) times daily with a meal. 10/06/14  Yes Tonia Ghent, MD  colchicine 0.6 MG tablet Take 0.6 mg by mouth as needed (for gout).  04/09/14  Yes Historical Provider, MD  feeding supplement (BOOST HIGH PROTEIN) LIQD Take 1 Container by mouth as directed.   Yes Historical Provider, MD  fluticasone (FLONASE) 50 MCG/ACT nasal spray Place 2 sprays into both nostrils daily. 03/15/14  Yes Ivan Anchors Love, PA-C  furosemide (LASIX) 40 MG tablet Take 40 mg by mouth as needed for edema.   Yes Historical Provider, MD  hydrocortisone (ANUSOL-HC) 25 MG suppository Place 1 suppository (25 mg total) rectally daily. X 1 week; then, use as needed thereafter. 07/26/14  Yes Jerene Bears, MD  meclizine (ANTIVERT) 25 MG tablet Take 1 tablet  (25 mg total) by mouth 3 (three) times daily as needed for dizziness. Patient taking differently: Take 25 mg by mouth 3 (three) times daily as needed for dizziness (bed-time).  03/15/14  Yes Ivan Anchors Love, PA-C  Melatonin 10 MG CAPS Take 1 capsule by mouth at bedtime.   Yes Historical Provider, MD  methylphenidate (RITALIN) 10 MG tablet Take up to 3 times a day po 10/26/14  Yes Britt Bottom, MD  OVER THE  COUNTER MEDICATION Inhale 1 application into the lungs at bedtime. C-PAP   Yes Historical Provider, MD  pantoprazole (PROTONIX) 40 MG tablet TAKE 1 TABLET BY MOUTH 2 TIMES A DAY. 09/15/14  Yes [name], MD  polyethylene glycol powder (GLYCOLAX/MIRALAX) powder Take 1 Container by mouth 3 (three) times daily.   Yes Historical Provider, MD  promethazine (PHENERGAN) 12.5 MG tablet Take 1-2 tablets (12.5-25 mg total) by mouth every 6 (six) hours as needed for nausea or vomiting. 10/28/14  Yes Pleas Koch, NP  ranolazine (RANEXA) 500 MG 12 hr tablet Take 1 tablet (500 mg total) by mouth 2 (two) times daily. 05/25/14  Yes [name], MD  rivaroxaban (XARELTO) 20 MG TABS tablet Take 1 tablet (20 mg total) by mouth daily with supper. 05/05/14  Yes Britt Bottom, MD  rosuvastatin (CRESTOR) 40 MG tablet Take 1 tablet (40 mg total) by mouth daily. 09/21/14  Yes [name], MD  Sennosides (SENOKOT PO) Take 2 tablets by mouth daily.   Yes Historical Provider, MD  Vitamin D, Ergocalciferol, (DRISDOL) 50000 UNITS CAPS capsule Take 50,000 Units by mouth every 7 (seven) days. Take on Mondays   Yes Historical Provider, MD  nitroGLYCERIN (NITROSTAT) 0.4 MG SL tablet Place 0.4 mg under the tongue every 5 (five) minutes as needed. For chest pain    Historical Provider, MD    Current Facility-Administered Medications  Medication Dose Route Frequency Provider Last Rate Last Dose  . 0.9 %  sodium chloride infusion   Intravenous Continuous Robbie Lis, MD 75 mL/hr at 11/12/14 [phone]    . acetaminophen  (TYLENOL) tablet 650 mg  650 mg Oral Q6H PRN Robbie Lis, MD      . ALPRAZolam Duanne Moron) tablet 0.125 mg  0.125 mg Oral Q8H PRN Robbie Lis, MD      . aspirin EC tablet 81 mg  81 mg Oral Daily Robbie Lis, MD   81 mg at 11/12/14 1030  . colchicine tablet 0.6 mg  0.6 mg Oral PRN Robbie Lis, MD      . feeding supplement (ENSURE ENLIVE) (ENSURE ENLIVE) liquid 237 mL  237 mL Oral BID BM Robbie Lis, MD      . fluticasone [facility]) 50 MCG/ACT nasal spray 2 spray  2 spray Each Nare Daily Robbie Lis, MD   2 spray at 11/11/14 1722  . hydrocortisone (ANUSOL-HC) suppository 25 mg  25 mg Rectal Daily Robbie Lis, MD   25 mg at 11/11/14 1722  . meclizine (ANTIVERT) tablet 25 mg  25 mg Oral TID PRN Robbie Lis, MD      . ondansetron [facility]) tablet 4 mg  4 mg Oral Q6H PRN Robbie Lis, MD       Or  . ondansetron [facility]) injection 4 mg  4 mg Intravenous Q6H PRN Robbie Lis, MD      . pantoprazole (PROTONIX) EC tablet 40 mg  40 mg Oral Daily Robbie Lis, MD   40 mg at 11/12/14 1030  . ranolazine (RANEXA) 12 hr tablet 500 mg  500 mg Oral BID Robbie Lis, MD   500 mg at 11/12/14 1030  . rosuvastatin (CRESTOR) tablet 40 mg  40 mg Oral Daily Robbie Lis, MD   40 mg at 11/12/14 1030  . [START ON 11/16/2014] Vitamin D (Ergocalciferol) (DRISDOL) capsule 50,000 Units  50,000 Units Oral Q7 days Robbie Lis, MD  Allergies as of 11/11/2014 - Review Complete 11/11/2014  Allergen Reaction Noted  . Diphenhydramine hcl Anaphylaxis 06/30/2011     Review of Systems:    This is positive for those things mentioned in the HPI All other review of systems are negative.       Physical Exam:  Vital signs in last 24 hours: Temp:  [97.7 F (36.5 C)-98.3 F (36.8 C)] 98.3 F (36.8 C) (08/27 0628) Pulse Rate:  [71-118] 73 (08/27 0628) Resp:  [15-20] 15 (08/27 0628) BP: (81-101)/(47-74) 93/60 mmHg (08/27 0628) SpO2:  [94 %-98 %] 97 % (08/27 0628) Weight:  [220 lb 1.6 oz (99.837 kg)] 220  lb 1.6 oz (99.837 kg) (08/26 1638) Last BM Date: 11/07/14  General:  Well-developed, well-nourished and in no acute distress Eyes:  anicteric. Neck:   supple w/o  or mass.  Lungs: Clear to auscultation bilaterally. Heart:  S1S2, no rubs, murmurs, gallops. Abdomen:  obese soft, non-tender, no hepatosplenomegaly, hernia, or mass and BS+.   Lymph:  no cervical or supraclavicular adenopathy. Extremities:   no edema Skin   no rash. Neuro:  A&O x 3. Left hemiplegia, no nystagmus Psych:  appropriate mood and  Affect.   Data Reviewed:   LAB RESULTS:  Recent Labs  11/11/14 1339 11/11/14 1850 11/12/14 0907  WBC 10.4 8.3 7.1  HGB 15.4 14.9 14.1  HCT 45.2 42.9 41.1  PLT 193 180 155   BMET  Recent Labs  11/11/14 1339 11/11/14 1850  NA 132* 134*  K 2.6* 2.8*  CL 96* 98*  CO2 20* 24  GLUCOSE 138* 134*  BUN 16 14  CREATININE 1.65* 1.46*  CALCIUM 9.2 8.6*   LFT  Recent Labs  11/11/14 1339  PROT 7.6  ALBUMIN 3.5  AST 21  ALT 14*  ALKPHOS 73  BILITOT 1.4*   PT/INR  Recent Labs  11/11/14 1850  LABPROT 30.9*  INR 3.04*    STUDIES: Dg Abd Acute W/chest  11/11/2014   CLINICAL DATA:  Patient reports vomiting when trying to stand, duration of symptoms for the past month, history of diverticulosis, tubular adenoma of the colon, and diabetes, also history of coronary artery disease and previous MI and CABG.  EXAM: DG ABDOMEN ACUTE W/ 1V CHEST  COMPARISON:  KUB of October 28, 2014 and PA and lateral chest x-ray of June 21, 2014.  FINDINGS: Chest x-ray: The lungs are adequately inflated. There is stable density overlying the left cardiac apex likely reflecting atelectasis or scarring. The heart is top-normal in size. The pulmonary vascularity is normal. There are 6 sternal wires. The next the lowest wire is fractured. This is stable. There is an implantable cardiac rhythm recording device present on the left.  Within the abdomen the gas and stool pattern is unremarkable. There  are no abnormal soft tissue calcifications. There are phleboliths in the pelvis. The bony structures are unremarkable.  IMPRESSION: There is no active cardiopulmonary disease. There is no acute intra abdominal abnormality.   Electronically Signed   By: David  Martinique M.D.   On: 11/11/2014 14:24     PREVIOUS ENDOSCOPIES:            EGD 2015 colonoscopies Thanks   LOS: 1 day   @Carl  Simonne Maffucci, MD, [facility] @  11/12/2014, 10:38 AM

## 2014-11-12 NOTE — Progress Notes (Signed)
ANTICOAGULATION CONSULT NOTE - Initial Consult  Pharmacy Consult for Heparin Indication: hx of VTE (PTA xarelto on hold)  Allergies  Allergen Reactions  . Diphenhydramine Hcl Anaphylaxis    Patient Measurements: Height: 6' (182.9 cm) Weight: 220 lb 1.6 oz (99.837 kg) IBW/kg (Calculated) : 77.6 Heparin Dosing Weight: 97.9  Vital Signs: Temp: 98.3 F (36.8 C) (08/27 0628) Temp Source: Oral (08/27 0628) BP: 93/60 mmHg (08/27 0628) Pulse Rate: 73 (08/27 0628)  Labs:  Recent Labs  11/11/14 1339 11/11/14 1850 11/12/14 0907  HGB 15.4 14.9 14.1  HCT 45.2 42.9 41.1  PLT 193 180 155  APTT  --  50*  --   LABPROT  --  30.9*  --   INR  --  3.04*  --   CREATININE 1.65* 1.46* 1.27*    Estimated Creatinine Clearance: 71.9 mL/min (by C-G formula based on Cr of 1.27).   Medical History: Past Medical History  Diagnosis Date  . Coronary artery disease     stent, then CABG 07/20/10  . Hypertension   . Hyperlipidemia   . Obesity (BMI 30-39.9)   . H/O cardiomyopathy     ischemic, now last echo 07/30/11, EF 38%W  . Diverticulosis   . Internal hemorrhoids   . Tubular adenoma of colon   . Plantar fasciitis of left foot   . Basal cell carcinoma of skin of left ankle   . NSTEMI (non-ST elevated myocardial infarction)     NSTEMI- last cath 06/2011-Stent to LCX-DES, last nuc 07/30/11 low risk  . MI (myocardial infarction)     "I had 4 before I retired in 2006" (11/11/2014)  . Pulmonary embolism 03/2014  . OSA on CPAP     since July 2013  . GERD (gastroesophageal reflux disease)   . Bleeding stomach ulcer 2015  . Daily headache     "for the past month" (11/11/2014)  . Stroke 01/2014    "no control of LUE, can slightly LLE; memory problems since" (11/11/2014)  . Arthritis     "back" (11/11/2014)  . Gout   . Depression     Medications:  Scheduled:  . aspirin EC  81 mg Oral Daily  . feeding supplement (ENSURE ENLIVE)  237 mL Oral BID BM  . fluticasone  2 spray Each Nare Daily  .  hydrocortisone  25 mg Rectal Daily  . pantoprazole  40 mg Oral Daily  . potassium chloride  10 mEq Intravenous Q1 Hr x 5  . ranolazine  500 mg Oral BID  . rosuvastatin  40 mg Oral Daily  . [START ON 11/16/2014] Vitamin D (Ergocalciferol)  50,000 Units Oral Q7 days   Infusions:  . sodium chloride 75 mL/hr at 11/12/14 0643   PRN: acetaminophen, ALPRAZolam, colchicine, meclizine, ondansetron **OR** ondansetron (ZOFRAN) IV  Assessment: 64 yo M admitted 8/26 with N/V and dehydration.  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  Pharmacy consulted to dose heparin for hx of vte and will stop xarelto for EGD per Dr Sloan Leiter.  Plan to resume xarelto when EGD completed.  No s/sx of bleeding noted.  PLTs 155 wnl, Hgb stable 14.1. Last xarelto dose 8/26 1721.   Goal of Therapy:  Heparin level 0.3-0.7 units/ml (aPTT 66-102s) Monitor platelets by anticoagulation protocol: Yes   Plan:  Initiate heparin drip 1650 units/hr  8 hr HL Daily HL/CBC Monitor for s/sx of bleeding  Bennye Alm, PharmD Pharmacy Resident [phone]

## 2014-11-13 ENCOUNTER — Encounter: Payer: Self-pay | Admitting: Family Medicine

## 2014-11-13 LAB — BASIC METABOLIC PANEL WITH GFR
Anion gap: 5 (ref 5–15)
BUN: 7 mg/dL (ref 6–20)
CO2: 25 mmol/L (ref 22–32)
Calcium: 8.1 mg/dL — ABNORMAL LOW (ref 8.9–10.3)
Chloride: 105 mmol/L (ref 101–111)
Creatinine, Ser: 1.14 mg/dL (ref 0.61–1.24)
GFR calc Af Amer: >60 mL/min
GFR calc non Af Amer: >60 mL/min
Glucose, Bld: 112 mg/dL — ABNORMAL HIGH (ref 65–99)
Potassium: 2.8 mmol/L — ABNORMAL LOW (ref 3.5–5.1)
Sodium: 135 mmol/L (ref 135–145)

## 2014-11-13 LAB — APTT
aPTT: >200 seconds (ref 24–37)
aPTT: >200 s (ref 24–37)
aPTT: 141 s — ABNORMAL HIGH (ref 24–37)

## 2014-11-13 LAB — CBC
HCT: 39.3 % (ref 39.0–52.0)
Hemoglobin: 13.2 g/dL (ref 13.0–17.0)
MCH: 28.4 pg (ref 26.0–34.0)
MCHC: 33.6 g/dL (ref 30.0–36.0)
MCV: 84.5 fL (ref 78.0–100.0)
Platelets: 121 10*3/uL — ABNORMAL LOW (ref 150–400)
RBC: 4.65 MIL/uL (ref 4.22–5.81)
RDW: 15.4 % (ref 11.5–15.5)
WBC: 6.1 10*3/uL (ref 4.0–10.5)

## 2014-11-13 LAB — MAGNESIUM: Magnesium: 1.8 mg/dL (ref 1.7–2.4)

## 2014-11-13 LAB — HEPARIN LEVEL (UNFRACTIONATED)
Heparin Unfractionated: 1.82 IU/mL — ABNORMAL HIGH (ref 0.30–0.70)
Heparin Unfractionated: 1.12 [IU]/mL — ABNORMAL HIGH (ref 0.30–0.70)

## 2014-11-13 MED ORDER — POTASSIUM CHLORIDE CRYS ER 20 MEQ PO TBCR: 40.0000 meq | EXTENDED_RELEASE_TABLET | ORAL | Status: DC

## 2014-11-13 MED ORDER — SODIUM CHLORIDE 0.9 % IV SOLN
INTRAVENOUS | Status: DC
  Administered 2014-11-13: 14:00:00 via INTRAVENOUS

## 2014-11-13 MED ORDER — POTASSIUM CHLORIDE CRYS ER 20 MEQ PO TBCR
40.0000 meq | EXTENDED_RELEASE_TABLET | ORAL | Status: AC
  Administered 2014-11-13 (×2): 40 meq via ORAL
  Filled 2014-11-13 (×2): qty 2

## 2014-11-13 MED ORDER — HEPARIN (PORCINE) IN NACL 100-0.45 UNIT/ML-% IJ SOLN
700.0000 [IU]/h | INTRAMUSCULAR | Status: AC
  Administered 2014-11-13 (×2): 1300 [IU]/h via INTRAVENOUS
  Filled 2014-11-13: qty 250

## 2014-11-13 NOTE — Progress Notes (Signed)
PATIENT DETAILS Name: Gregory Wiley Age: 64 y.o. Sex: male Date of Birth: 18-Feb-1951 Admit Date: 11/11/2014 Admitting Physician Robbie Lis, MD LEX:[id number] Damita Dunnings, MD  Subjective: Feels better-no vomiting since admission.   Assessment/Plan: Principal Problem: Nausea/vomiting: Ongoing for 1 month, but none since hospitalization. Reviewed prior EGD done in 2015, patient had a gastric ulcer-recommendations were to repeat EGD.Consulted GI-EGD in am  Active Problems: Hypokalemia: Secondary to GI loss: Await repeat BMET this am-repleted yesterday  Acute renal failure: Likely secondary to prerenal azotemia, Improved with IVF, await electrolytes this am  History of CAD: Without chest pain or shortness of breath. Continue aspirin, statin  History of pulmonary embolism: In anticipation of EGD-stopped Xarelto, now  on heparin infusion. Resume Xarelto when EGD completed.  History of CVA with residual left-sided hemiplegia: Continue ASA-may need to discontinue-as on Eliquis-await EGD report  Dyslipidemia: Continue statin  History of hypertension: Although BP improved-still soft-continue to hold Coreg and Lasix  Chronic systolic and diastolic heart failure: Last Echo-EF-40% to 45%.Clinically compensated, stop IV fluids. Resume beta blocker and lasix when able-BP currently  soft  Protein-calorie malnutrition, severe: Continue supplements  Disposition: Remain inpatient  Antimicrobial agents  See below  Anti-infectives    None      DVT Prophylaxis: Xarelto to Heparin gtt  Code Status: Full code  Family Communication None at bedside  Procedures: None  CONSULTS:  GI  Time spent 30 minutes-Greater than 50% of this time was spent in counseling, explanation of diagnosis, planning of further management, and coordination of care.  MEDICATIONS: Scheduled Meds: . aspirin EC  81 mg Oral Daily  . feeding supplement (ENSURE ENLIVE)  237 mL Oral BID BM  .  fluticasone  2 spray Each Nare Daily  . hydrocortisone  25 mg Rectal Daily  . pantoprazole  40 mg Oral Daily  . ranolazine  500 mg Oral BID  . rosuvastatin  40 mg Oral Daily  . [START ON 11/16/2014] Vitamin D (Ergocalciferol)  50,000 Units Oral Q7 days   Continuous Infusions: . sodium chloride 75 mL/hr at 11/12/14 2353  . heparin 1,300 Units/hr (11/13/14 0300)   PRN Meds:.acetaminophen, ALPRAZolam, colchicine, meclizine, ondansetron **OR** ondansetron (ZOFRAN) IV    PHYSICAL EXAM: Vital signs in last 24 hours: Filed Vitals:   11/12/14 0628 11/12/14 1439 11/12/14 2100 11/13/14 0529  BP: 93/60 89/55 100/58 92/55  Pulse: 73 72 70 72  Temp: 98.3 F (36.8 C) 98 F (36.7 C) 98.3 F (36.8 C) 97.5 F (36.4 C)  TempSrc: Oral Oral Oral Oral  Resp: 15  17 18   Height:      Weight:    102.83 kg (226 lb 11.2 oz)  SpO2: 97% 94% 99% 98%    Weight change: 2.994 kg (6 lb 9.6 oz) Filed Weights   11/11/14 1638 11/13/14 0529  Weight: 99.837 kg (220 lb 1.6 oz) 102.83 kg (226 lb 11.2 oz)   Body mass index is 30.74 kg/(m^2).   Gen Exam: Awake and alert with clear speech.  Neck: Supple, No JVD.   Chest: B/L Clear.   CVS: S1 S2 Regular, no murmurs.  Abdomen: soft, BS +, non tender, non distended.  Extremities: no edema, lower extremities warm to touch. Neurologic: Left hemiplegia   Skin: No Rash.   Wounds: N/A.   Intake/Output from previous day:  Intake/Output Summary (Last 24 hours) at 11/13/14 1057 Last data filed at 11/13/14 7494  Gross  per 24 hour  Intake 2165.45 ml  Output   1300 ml  Net 865.45 ml     LAB RESULTS: CBC  Recent Labs Lab 11/11/14 1339 11/11/14 1850 11/12/14 0907 11/13/14 0109  WBC 10.4 8.3 7.1 6.1  HGB 15.4 14.9 14.1 13.2  HCT 45.2 42.9 41.1 39.3  PLT 193 180 155 121*  MCV 82.8 83.6 82.9 84.5  MCH 28.2 29.0 28.4 28.4  MCHC 34.1 34.7 34.3 33.6  RDW 15.2 15.1 15.2 15.4  LYMPHSABS  --  1.8  --   --   MONOABS  --  0.4  --   --   EOSABS  --  0.1  --    --   BASOSABS  --  0.0  --   --     Chemistries   Recent Labs Lab 11/11/14 1339 11/11/14 1850 11/12/14 0907  NA 132* 134* 135  K 2.6* 2.8* 2.7*  CL 96* 98* 102  CO2 20* 24 23  GLUCOSE 138* 134* 98  BUN 16 14 13   CREATININE 1.65* 1.46* 1.27*  CALCIUM 9.2 8.6* 8.6*  MG  --  2.0  --     CBG: No results for input(s): GLUCAP in the last 168 hours.  GFR Estimated Creatinine Clearance: 72.9 mL/min (by C-G formula based on Cr of 1.27).  Coagulation profile  Recent Labs Lab 11/11/14 1850  INR 3.04*    Cardiac Enzymes No results for input(s): CKMB, TROPONINI, MYOGLOBIN in the last 168 hours.  Invalid input(s): CK  Invalid input(s): POCBNP No results for input(s): DDIMER in the last 72 hours. No results for input(s): HGBA1C in the last 72 hours. No results for input(s): CHOL, HDL, LDLCALC, TRIG, CHOLHDL, LDLDIRECT in the last 72 hours.  Recent Labs  11/11/14 1850  TSH 0.746   No results for input(s): VITAMINB12, FOLATE, FERRITIN, TIBC, IRON, RETICCTPCT in the last 72 hours.  Recent Labs  11/11/14 1339  LIPASE 21*    Urine Studies No results for input(s): UHGB, CRYS in the last 72 hours.  Invalid input(s): UACOL, UAPR, USPG, UPH, UTP, UGL, UKET, UBIL, UNIT, UROB, ULEU, UEPI, UWBC, URBC, UBAC, CAST, UCOM, BILUA  MICROBIOLOGY: No results found for this or any previous visit (from the past 240 hour(s)).  RADIOLOGY STUDIES/RESULTS: Dg Abd 1 View  10/28/2014   CLINICAL DATA:  No bowel movement for the past 2 weeks, nausea and vomiting now  EXAM: ABDOMEN - 1 VIEW  COMPARISON:  None in PACs  FINDINGS: The colonic stool burden is increased diffusely. There is a moderate amount of gas within bowel in the mid abdomen, likely the sigmoid. There is no small bowel obstructive pattern. There are numerous pelvic phleboliths. No acute bony abnormality is observed.  IMPRESSION: Increase colonic stool burden likely reflects clinical constipation. There is no evidence of  obstruction or perforation.   Electronically Signed   By: David  Martinique M.D.   On: 10/28/2014 11:49   Dg Abd Acute W/chest  11/11/2014   CLINICAL DATA:  Patient reports vomiting when trying to stand, duration of symptoms for the past month, history of diverticulosis, tubular adenoma of the colon, and diabetes, also history of coronary artery disease and previous MI and CABG.  EXAM: DG ABDOMEN ACUTE W/ 1V CHEST  COMPARISON:  KUB of October 28, 2014 and PA and lateral chest x-ray of June 21, 2014.  FINDINGS: Chest x-ray: The lungs are adequately inflated. There is stable density overlying the left cardiac apex likely reflecting atelectasis or scarring. The  heart is top-normal in size. The pulmonary vascularity is normal. There are 6 sternal wires. The next the lowest wire is fractured. This is stable. There is an implantable cardiac rhythm recording device present on the left.  Within the abdomen the gas and stool pattern is unremarkable. There are no abnormal soft tissue calcifications. There are phleboliths in the pelvis. The bony structures are unremarkable.  IMPRESSION: There is no active cardiopulmonary disease. There is no acute intra abdominal abnormality.   Electronically Signed   By: David  Martinique M.D.   On: 11/11/2014 14:24    Oren Binet, MD  [facility] Pager:[phone]  If 7PM-7AM, please contact night-coverage [email or web address] Password [facility] 11/13/2014, 10:57 AM   LOS: 2 days

## 2014-11-13 NOTE — Evaluation (Signed)
Physical Therapy Evaluation Patient Details Name: Gregory Wiley MRN: [id number] DOB: [birth year] Today's Date: 11/13/2014   History of Present Illness  Pt is a 64 y/o M admitted  w/ ongoing vomiting for 2 wks.  Current diagnosis is dehydration and failure to thrive.  Pt's PMH inlcudes HTN, CAD, CHF, CVA w/ residual Lt sided weakness.  Clinical Impression  Pt admitted with above diagnosis. Pt currently with functional limitations due to the deficits listed below (see PT Problem List). Gregory Wiley is from home where he has 24/7 assist from wife.  See general notes below regarding pt's d/c plan and goals for therapy. Pt will benefit from skilled PT to increase their independence and safety with mobility to allow discharge to the venue listed below.     Follow Up Recommendations Home health PT;Supervision/Assistance - 24 hour    Equipment Recommendations  None recommended by PT    Recommendations for Other Services OT consult     Precautions / Restrictions Precautions Precautions: Fall Restrictions Weight Bearing Restrictions: No      Mobility  Bed Mobility Overal bed mobility: Needs Assistance;+2 for physical assistance Bed Mobility: Supine to Sit     Supine to sit: Mod assist;+2 for physical assistance;HOB elevated     General bed mobility comments: HOB up all the way and mod +2 assist to support trunk posteriorly and to manage Lt U/LE.    Transfers Overall transfer level: Needs assistance Equipment used: 2 person hand held assist Transfers: Sit to/from [facility] Sit to Stand: Mod assist;+2 physical assistance Stand pivot transfers: Mod assist;+2 physical assistance       General transfer comment: Mod +2 assist to power up to standing and to maintain balance during stand pivot w/ cues for reaching back to recliner chair to sit.    Ambulation/Gait             General Gait Details: Deferred to next session, pt's wife to bring pt's quad cane to  hospital tonight so it will be available to use at next session.  Pt and pt's wife express desire to attempt to ambulate to bathroom to simulate being at home.  Stairs            Wheelchair Mobility    Modified Rankin (Stroke Patients Only)       Balance Overall balance assessment: Needs assistance;History of Falls Sitting-balance support: Single extremity supported;Feet supported Sitting balance-Leahy Scale: Fair     Standing balance support: Bilateral upper extremity supported;During functional activity Standing balance-Leahy Scale: Poor Standing balance comment: Relies on support to maintain balance                             Pertinent Vitals/Pain Pain Assessment: No/denies pain    Home Living Family/patient expects to be discharged to:: Private residence Living Arrangements: Spouse/significant other Available Help at Discharge: Family;Available 24 hours/day Type of Home: House Home Access: Ramped entrance     Home Layout: One level Home Equipment: Cane - quad;Wheelchair - Education officer, community - power (electric WC is broken)      Prior Function Level of Independence: Needs assistance   Gait / Transfers Assistance Needed: Uses quad cane to ambulate from bed to bathroom and bed to chair.  Wife uses gait belt to help pt stand and then once standing and ambulating wife is behind pt in case pt loses his balance  ADL's / Homemaking Assistance Needed: Needs assist from wife for bathing/dressing  Comments: Pt has had ~5 falls in the past year from tripping     Hand Dominance   Dominant Hand: Right    Extremity/Trunk Assessment   Upper Extremity Assessment: LUE deficits/detail       LUE Deficits / Details: No use of Lt UE 2/2 previous CVA   Lower Extremity Assessment: LLE deficits/detail   LLE Deficits / Details: Grossly 3/5 strength Lt LE.  Typically uses Lt AFO which wife will bring for next session.     Communication   Communication: No  difficulties  Cognition Arousal/Alertness: Awake/alert Behavior During Therapy: Agitated Overall Cognitive Status: Within Functional Limits for tasks assessed                      General Comments General comments (skin integrity, edema, etc.): Talked extensively to pt and pt's wife about their anticipated d/c plan.  Both express desire to return home w/ HHPT if pt is able to ambulate w/ quad cane w/ wife's assist to bathroom w/o additional assist needed.  Therefore this will be attempted at next session as appropriate to determine pt's safety in returning home vs. SNF.  The wife expressed that the pt typically doesn't work as hard when he gets home but the pt says this is because he doesn't want to hurt his wife and make her work too hard.  I explained to them that the best thing they can do to address this issue is to be as open as possible about what is working and what isn't working at home and to increase communication, both verbalized understanding.    Exercises        Assessment/Plan    PT Assessment Patient needs continued PT services  PT Diagnosis Difficulty walking;Hemiplegia non-dominant side;Generalized weakness;Abnormality of gait   PT Problem List Decreased strength;Decreased range of motion;Decreased activity tolerance;Decreased balance;Decreased mobility;Decreased knowledge of use of DME;Decreased safety awareness;Decreased knowledge of precautions  PT Treatment Interventions DME instruction;Gait training;Functional mobility training;Therapeutic activities;Therapeutic exercise;Balance training;Neuromuscular re-education;Patient/family education   PT Goals (Current goals can be found in the Care Plan section) Acute Rehab PT Goals Patient Stated Goal: to determine if he can go home  PT Goal Formulation: With patient/family Time For Goal Achievement: 11/27/14 Potential to Achieve Goals: Fair    Frequency Min 5X/week   Barriers to discharge        Co-evaluation                End of Session Equipment Utilized During Treatment: Gait belt Activity Tolerance: Treatment limited secondary to agitation Patient left: in chair;with call bell/phone within reach;with family/visitor present Nurse Communication: Mobility status;Precautions         Time: 2683-4196 PT Time Calculation (min) (ACUTE ONLY): 25 min   Charges:   PT Evaluation $Initial PT Evaluation Tier I: 1 Procedure PT Treatments $Therapeutic Activity: 8-22 mins   PT G Codes:       Joslyn Hy PT, DPT [phone] Pager: [phone] 11/13/2014, 3:31 PM

## 2014-11-13 NOTE — Progress Notes (Signed)
Placed pt. On cpap. Pt. Tolerating well at this time. 

## 2014-11-13 NOTE — Progress Notes (Signed)
Notified Pharmacist of PTT .200. Was told to stop Heparin drip and restart @ 0300 to run @ 13. Heparin drip stopped at this time.

## 2014-11-13 NOTE — Progress Notes (Addendum)
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 Diphenhydramine Hcl Anaphylaxis    Patient Measurements: Height: 6' (182.9 cm) Weight: 226 lb 11.2 oz (102.83 kg) IBW/kg (Calculated) : 77.6 Heparin Dosing Weight: 97.9  Vital Signs: Temp: 98 F (36.7 C) (08/28 1000) Temp Source: Oral (08/28 1000) BP: 93/56 mmHg (08/28 1000) Pulse Rate: 71 (08/28 1000)  Labs:  Recent Labs  11/11/14 1850 11/12/14 0907 11/12/14 1705 11/13/14 0109 11/13/14 1045  HGB 14.9 14.1  --  13.2  --   HCT 42.9 41.1  --  39.3  --   PLT 180 155  --  121*  --   APTT 50*  --  45* >200* >200*  LABPROT 30.9*  --   --   --   --   INR 3.04*  --   --   --   --   HEPARINUNFRC  --   --  1.72* 1.82* 1.12*  CREATININE 1.46* 1.27*  --   --  1.14    Estimated Creatinine Clearance: 81.2 mL/min (by C-G formula based on Cr of 1.14).   Medications:  Scheduled:  . aspirin EC  81 mg Oral Daily  . feeding supplement (ENSURE ENLIVE)  237 mL Oral BID BM  . fluticasone  2 spray Each Nare Daily  . hydrocortisone  25 mg Rectal Daily  . pantoprazole  40 mg Oral Daily  . ranolazine  500 mg Oral BID  . rosuvastatin  40 mg Oral Daily  . [START ON 11/16/2014] Vitamin D (Ergocalciferol)  50,000 Units Oral Q7 days   Infusions:  . sodium chloride 10 mL/hr (11/13/14 1128)  . heparin 1,300 Units/hr (11/13/14 1128)   PRN: acetaminophen, ALPRAZolam, colchicine, meclizine, ondansetron **OR** ondansetron (ZOFRAN) IV  Assessment: 64 yo M admitted 8/26 with N/V and dehydration. 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 Pharmacy consulted to dose heparin for hx of vte and will stop xarelto for EGD per Dr Sloan Leiter. Plan to resume xarelto when EGD completed. No s/sx of bleeding noted. PLTs 121 decreased, Hgb dec to 13.2. Last xarelto dose  8/26 1721. aPTT currently elevated at >200.  HL 1.12.  Will decrease gtt by 3 units/kg/hr  Goal of Therapy:  Heparin level 0.3-0.7 units/ml aPTT 66-102 seconds Monitor platelets by anticoagulation protocol: Yes   Plan:  Decrease heparin gtt to 1000 units/hr Check aPTT  in 6 hrs Daily aPTT/HL Monitor for s/sx of bleeding  Bennye Alm, PharmD Pharmacy Resident [phone]

## 2014-11-13 NOTE — Progress Notes (Signed)
ANTICOAGULATION CONSULT NOTE - Follow Up Consult  Pharmacy Consult for heparin Indication: h/o VTE   Labs:  Recent Labs  11/11/14 1339 11/11/14 1850 11/12/14 0907 11/12/14 1705 11/13/14 0109  HGB 15.4 14.9 14.1  --  13.2  HCT 45.2 42.9 41.1  --  39.3  PLT 193 180 155  --  121*  APTT  --  50*  --  45* >200*  LABPROT  --  30.9*  --   --   --   INR  --  3.04*  --   --   --   HEPARINUNFRC  --   --   --  1.72* 1.82*  CREATININE 1.65* 1.46* 1.27*  --   --       Assessment: 63yo male supratherapeutic on heparin with initial dosing while Xarelto on hold; per pt lab was drawn in hand opposite from heparin infusion.  Goal of Therapy:  Heparin level 0.3-0.7 units/ml aPTT 66-102 seconds   Plan:  Will hold heparin gtt x65min then resume at lower rate of 1300 units/hr and check PTT in 6hr.  Wynona Neat, PharmD, BCPS  11/13/2014,2:22 AM

## 2014-11-13 NOTE — Progress Notes (Signed)
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 Diphenhydramine Hcl Anaphylaxis    Patient Measurements: Height: 6' (182.9 cm) Weight: 226 lb 11.2 oz (102.83 kg) IBW/kg (Calculated) : 77.6 Heparin Dosing Weight: 97.9  Vital Signs: Temp: 98.1 F (36.7 C) (08/28 1434) Temp Source: Axillary (08/28 1434) BP: 93/60 mmHg (08/28 1434) Pulse Rate: 71 (08/28 1434)  Labs:  Recent Labs  11/11/14 1850 11/12/14 0907 11/12/14 1705 11/13/14 0109 11/13/14 1045 11/13/14 1858  HGB 14.9 14.1  --  13.2  --   --   HCT 42.9 41.1  --  39.3  --   --   PLT 180 155  --  121*  --   --   APTT 50*  --  45* >200* >200* 141*  LABPROT 30.9*  --   --   --   --   --   INR 3.04*  --   --   --   --   --   HEPARINUNFRC  --   --  1.72* 1.82* 1.12*  --   CREATININE 1.46* 1.27*  --   --  1.14  --     Estimated Creatinine Clearance: 81.2 mL/min (by C-G formula based on Cr of 1.14).   Medications:  Scheduled:  . aspirin EC  81 mg Oral Daily  . feeding supplement (ENSURE ENLIVE)  237 mL Oral BID BM  . fluticasone  2 spray Each Nare Daily  . hydrocortisone  25 mg Rectal Daily  . pantoprazole  40 mg Oral Daily  . ranolazine  500 mg Oral BID  . rosuvastatin  40 mg Oral Daily  . [START ON 11/16/2014] Vitamin D (Ergocalciferol)  50,000 Units Oral Q7 days   Infusions:  . sodium chloride 10 mL/hr (11/13/14 1128)  . sodium chloride 20 mL/hr at 11/13/14 1334  . heparin 1,000 Units/hr (11/13/14 1302)   PRN: acetaminophen, ALPRAZolam, colchicine, meclizine, ondansetron **OR** ondansetron (ZOFRAN) IV  Assessment: 64 yo M admitted 8/26 with N/V and dehydration.He is on xarelto PTA for hx PE and he is for EGD on 8/28 and pharmacy has been consulted to dose heparin. APTT= 141 after heparin decreased to 1000 units/hr.   -heparin off at 0600 8/29 for EGD  Goal of Therapy:  Heparin  level 0.3-0.7 units/ml aPTT 66-102 seconds Monitor platelets by anticoagulation protocol: Yes   Plan:  Decrease heparin gtt to 700 units/hr Daily aPTT/HL Monitor for s/sx of bleeding  Hildred Laser, Pharm D 11/13/2014 8:35 PM

## 2014-11-14 ENCOUNTER — Encounter (HOSPITAL_COMMUNITY): Payer: Self-pay | Admitting: Certified Registered"

## 2014-11-14 ENCOUNTER — Inpatient Hospital Stay (HOSPITAL_COMMUNITY): Payer: 59 | Admitting: Certified Registered"

## 2014-11-14 ENCOUNTER — Encounter (HOSPITAL_COMMUNITY)
Admission: EM | Disposition: A | Discharge status: Home Health | Payer: Self-pay | Source: Home / Self Care | Attending: Internal Medicine

## 2014-11-14 DIAGNOSIS — K2970 Gastritis, unspecified, without bleeding: Secondary | ICD-10-CM

## 2014-11-14 DIAGNOSIS — E860 Dehydration: Secondary | ICD-10-CM

## 2014-11-14 HISTORY — PX: ESOPHAGOGASTRODUODENOSCOPY (EGD) WITH PROPOFOL: SHX5813

## 2014-11-14 LAB — CBC
HCT: 38.5 % — ABNORMAL LOW (ref 39.0–52.0)
Hemoglobin: 13.3 g/dL (ref 13.0–17.0)
MCH: 29.1 pg (ref 26.0–34.0)
MCHC: 34.5 g/dL (ref 30.0–36.0)
MCV: 84.2 fL (ref 78.0–100.0)
Platelets: 114 10*3/uL — ABNORMAL LOW (ref 150–400)
RBC: 4.57 MIL/uL (ref 4.22–5.81)
RDW: 15.8 % — ABNORMAL HIGH (ref 11.5–15.5)
WBC: 5.8 10*3/uL (ref 4.0–10.5)

## 2014-11-14 LAB — BASIC METABOLIC PANEL
Anion gap: 6 (ref 5–15)
BUN: 6 mg/dL (ref 6–20)
CO2: 25 mmol/L (ref 22–32)
Calcium: 8.4 mg/dL — ABNORMAL LOW (ref 8.9–10.3)
Chloride: 106 mmol/L (ref 101–111)
Creatinine, Ser: 1.12 mg/dL (ref 0.61–1.24)
GFR calc Af Amer: >60 mL/min (ref >60)
GFR calc non Af Amer: >60 mL/min (ref >60)
Glucose, Bld: 86 mg/dL (ref 65–99)
Potassium: 3.8 mmol/L (ref 3.5–5.1)
Sodium: 137 mmol/L (ref 135–145)

## 2014-11-14 LAB — HEPARIN LEVEL (UNFRACTIONATED): Heparin Unfractionated: 0.67 IU/mL (ref 0.30–0.70)

## 2014-11-14 LAB — GLUCOSE, CAPILLARY: Glucose-Capillary: 79 mg/dL (ref 65–99)

## 2014-11-14 LAB — APTT: aPTT: 67 seconds — ABNORMAL HIGH (ref 24–37)

## 2014-11-14 SURGERY — ESOPHAGOGASTRODUODENOSCOPY (EGD) WITH PROPOFOL
Anesthesia: Monitor Anesthesia Care

## 2014-11-14 MED ORDER — ONDANSETRON HCL 4 MG PO TABS
4.0000 mg | ORAL_TABLET | Freq: Two times a day (BID) | ORAL | Status: DC
  Administered 2014-11-14 – 2014-11-16 (×5): 4 mg via ORAL
  Filled 2014-11-14 (×5): qty 1

## 2014-11-14 MED ORDER — RIVAROXABAN 20 MG PO TABS
20.0000 mg | ORAL_TABLET | Freq: Every day | ORAL | Status: DC
  Administered 2014-11-14 – 2014-11-15 (×2): 20 mg via ORAL
  Filled 2014-11-14 (×2): qty 1

## 2014-11-14 MED ORDER — SODIUM CHLORIDE 0.9 % IV BOLUS (SEPSIS)
250.0000 mL | Freq: Once | INTRAVENOUS | Status: AC
  Administered 2014-11-14: 250 mL via INTRAVENOUS

## 2014-11-14 MED ORDER — PROPOFOL 10 MG/ML IV BOLUS
INTRAVENOUS | Status: DC | PRN
  Administered 2014-11-14 (×2): 10 mg via INTRAVENOUS

## 2014-11-14 MED ORDER — PROPOFOL INFUSION 10 MG/ML OPTIME
INTRAVENOUS | Status: DC | PRN
  Administered 2014-11-14: 100 ug/kg/min via INTRAVENOUS

## 2014-11-14 MED ORDER — BUTAMBEN-TETRACAINE-BENZOCAINE 2-2-14 % EX AERO
INHALATION_SPRAY | CUTANEOUS | Status: DC | PRN
  Administered 2014-11-14: 1 via TOPICAL

## 2014-11-14 MED ORDER — LACTATED RINGERS IV SOLN
INTRAVENOUS | Status: DC | PRN
  Administered 2014-11-14: 12:00:00 via INTRAVENOUS

## 2014-11-14 NOTE — Anesthesia Postprocedure Evaluation (Signed)
  Anesthesia Post-op Note  Patient: Gregory Wiley  Procedure(s) Performed: Procedure(s): ESOPHAGOGASTRODUODENOSCOPY (EGD) WITH PROPOFOL (N/A)  Patient Location: PACU  Anesthesia Type:MAC  Level of Consciousness: awake and alert   Airway and Oxygen Therapy: Patient Spontanous Breathing  Post-op Pain: none  Post-op Assessment: Post-op Vital signs reviewed              Post-op Vital Signs: Reviewed  Last Vitals:  Filed Vitals:   11/14/14 1329  BP: 100/66  Pulse: 62  Temp: 36.7 C  Resp: 16    Complications: No apparent anesthesia complications

## 2014-11-14 NOTE — Progress Notes (Signed)
Physical Therapy Treatment Patient Details Name: Gregory Wiley MRN: [id number] DOB: April 18, 1950 Today's Date: 11/14/2014    History of Present Illness Pt is a 64 y/o M admitted  w/ ongoing vomiting for 2 wks.  Current diagnosis is dehydration and failure to thrive.  Pt's PMH inlcudes HTN, CAD, CHF, CVA w/ residual Lt sided weakness.    PT Comments    Pt reports that before he would vomit, he was getting dizzy and then nausea would begin. Pt did not vomit during session but was dizzy with every bout of standing activity and dizziness did not subside with sitting, only with lying down. It is preventing him from being safe with ambulation at this point. If does not resolve, may need to discuss other d/c options.    Follow Up Recommendations  Home health PT;Supervision/Assistance - 24 hour     Equipment Recommendations  None recommended by PT    Recommendations for Other Services OT consult     Precautions / Restrictions Precautions Precautions: Fall Precaution Comments: pt has fallen at home and has been getting dizzy while up, describes as spinning, then tunnel vision, then ears get hot, then nausea Restrictions Weight Bearing Restrictions: No    Mobility  Bed Mobility Overal bed mobility: Needs Assistance Bed Mobility: Supine to Sit     Supine to sit: Mod assist     General bed mobility comments: pt outs HOB all the way up and then rolls to left side, then mod HHA for pt to pull sef to sitting. Wife did this as well and pt and wife felt that it was similar to what they do at home. Mod A for sit to supine for legs back into bed  Transfers Overall transfer level: Needs assistance Equipment used: Quad cane Transfers: Sit to/from Stand Sit to Stand: Min assist;Mod assist;+2 physical assistance         General transfer comment: First 2 sit to stand, pt was able to stand with wife giving mod A and PT with only close supervision. After pt dizziness worsened, he had more  trouble getting up, required +2 mod A and could not step feet to move to [facility]  Ambulation/Gait Ambulation/Gait assistance: Min assist Ambulation Distance (Feet): 20 Feet (10'x2) Assistive device: Quad cane Gait Pattern/deviations: Decreased weight shift to left Gait velocity: decreased Gait velocity interpretation: <1.8 ft/sec, indicative of risk for recurrent falls General Gait Details: pt with wide BOS, circumducts LLE, increased sway. Pt able to ambulate 10' with quad cane and wife guarding with min A, PT supervising closely. After 10', pt very dizzy and needed to sit immediately. BP 99/72. Ambulated back to bed with closer supervision by PT as pt dizziness never really cleared completely. BP 117/81 right after ambulation and 106/73 2 mins after. Pt symptoms did not improve, in fact could not continue to tolerate sitting up and needed to return to supine.   Stairs            Wheelchair Mobility    Modified Rankin (Stroke Patients Only)       Balance Overall balance assessment: Needs assistance;History of Falls Sitting-balance support: Single extremity supported Sitting balance-Leahy Scale: Good Sitting balance - Comments: sitting balance good before ambulation, fair after ambulation when pt dizzy   Standing balance support: Single extremity supported Standing balance-Leahy Scale: Poor Standing balance comment: cannot stand without single UE support.                     Cognition  Arousal/Alertness: Awake/alert Behavior During Therapy: WFL for tasks assessed/performed Overall Cognitive Status: Within Functional Limits for tasks assessed                      Exercises      General Comments General comments (skin integrity, edema, etc.): Pt and wife want to go home with HHPT but at this point, pt's dizziness keeping this from being safe. Pt not able to tolerate even household distances before getting dizzy and symptoms not resolving in sitting. Discussed  exercise program for home as well as safety with stairs (daughter's home) and ramps.       Pertinent Vitals/Pain Pain Assessment: No/denies pain    Home Living                      Prior Function            PT Goals (current goals can now be found in the care plan section) Acute Rehab PT Goals Patient Stated Goal: to determine if he can go home  PT Goal Formulation: With patient/family Time For Goal Achievement: 11/27/14 Potential to Achieve Goals: Fair Progress towards PT goals: Progressing toward goals    Frequency  Min 5X/week    PT Plan Current plan remains appropriate    Co-evaluation             End of Session Equipment Utilized During Treatment: Gait belt Activity Tolerance: Other (comment);Treatment limited secondary to medical complications (Comment) (dizziness) Patient left: in bed;with call bell/phone within reach;with family/visitor present     Time: 3428-7681 PT Time Calculation (min) (ACUTE ONLY): 36 min  Charges:  $Gait Training: 8-22 mins $Therapeutic Activity: 8-22 mins                    G Codes:     Leighton Roach, PT  Acute Rehab Services  [place], [place] 11/14/2014, 1:04 PM

## 2014-11-14 NOTE — Op Note (Addendum)
[facility] [place], [id number]   ENDOSCOPY PROCEDURE REPORT  PATIENT: Gregory Wiley, Gregory Wiley  MR#: [id number] BIRTHDATE: [birth year] , 35  yrs. old GENDER: male ENDOSCOPIST: Milus Banister, MD PROCEDURE DATE:  11/14/2014 PROCEDURE:  EGD w/ biopsy ASA CLASS:     Class III INDICATIONS:  nausea, vomiting, weight loss since CVA 9 months ago; history of gastric ulcer. MEDICATIONS: Monitored anesthesia care TOPICAL ANESTHETIC: none  DESCRIPTION OF PROCEDURE: After the risks benefits and alternatives of the procedure were thoroughly explained, informed consent was obtained.  The Pentax Gastroscope [id number] endoscope was introduced through the mouth and advanced to the second portion of the duodenum , Without limitations.  The instrument was slowly withdrawn as the mucosa was fully examined.    There was mild, non-specific distal gastritis.  This was biopsied and sent to pathology.  There was a Schatzki's ring above a 3cm hiatal hernia.  The examination was othewise normal.  The ring was not dilated due to lack of dysphagia symptoms.  Retroflexed views revealed no abnormalities.     The scope was then withdrawn from the patient and the procedure completed.  COMPLICATIONS: There were no immediate complications.  ENDOSCOPIC IMPRESSION: There was mild, non-specific distal gastritis.  This was biopsied and sent to pathology.  There was a Schatzki's ring above a 3cm hiatal hernia.  The examination was othewise normal.  The ring was not dilated due to lack of dysphagia symptoms  RECOMMENDATIONS: If the biopsies show H. pylori he will be started on appropriate antibiotics.  His nausea and vomiting seem more centrally mediated (usually preceeded with dizziness, headaches, started after  CVA 9 months ago).  For now, will change his anti-emetics to daily scheduled dosing rather than PRN. It is OK to restart his anticoagulation per primary  service.   eSigned:  Milus Banister, MD 11/14/2014 12:49 PM Revised: 11/14/2014 12:49 PM

## 2014-11-14 NOTE — Interval H&P Note (Signed)
History and Physical Interval Note:  11/14/2014 12:10 PM  Gregory Wiley  has presented today for surgery, with the diagnosis of Vomitin, history of gastric ulcer  The various methods of treatment have been discussed with the patient and family. After consideration of risks, benefits and other options for treatment, the patient has consented to  Procedure(s): ESOPHAGOGASTRODUODENOSCOPY (EGD) WITH PROPOFOL (N/A) as a surgical intervention .  The patient's history has been reviewed, patient examined, no change in status, stable for surgery.  I have reviewed the patient's chart and labs.  Questions were answered to the patient's satisfaction.     Milus Banister

## 2014-11-14 NOTE — Anesthesia Preprocedure Evaluation (Addendum)
Anesthesia Evaluation  Patient identified by MRN, date of birth, ID band Patient awake    Reviewed: Allergy & Precautions, NPO status , Patient's Chart, lab work & pertinent test results  Airway Mallampati: II  TM Distance: >3 FB Neck ROM: Full    Dental  (+) Teeth Intact, Dental Advisory Given   Pulmonary sleep apnea , former smoker,  breath sounds clear to auscultation        Cardiovascular hypertension, + CAD, + Past MI, + CABG and +CHF Rhythm:Regular Rate:Normal  EF 40-45%   Neuro/Psych Depression CVA, Residual Symptoms    GI/Hepatic Neg liver ROS, PUD, GERD-  ,  Endo/Other  negative endocrine ROS  Renal/GU Renal InsufficiencyRenal disease     Musculoskeletal  (+) Arthritis -,   Abdominal   Peds  Hematology negative hematology ROS (+)   Anesthesia Other Findings   Reproductive/Obstetrics                            Anesthesia Physical Anesthesia Plan  ASA: III  Anesthesia Plan: MAC   Post-op Pain Management:    Induction: Intravenous  Airway Management Planned: Natural Airway and Nasal Cannula  Additional Equipment:   Intra-op Plan:   Post-operative Plan:   Informed Consent: I have reviewed the patients History and Physical, chart, labs and discussed the procedure including the risks, benefits and alternatives for the proposed anesthesia with the patient or authorized representative who has indicated his/her understanding and acceptance.     Plan Discussed with: CRNA  Anesthesia Plan Comments:         Anesthesia Quick Evaluation

## 2014-11-14 NOTE — Transfer of Care (Signed)
Immediate Anesthesia Transfer of Care Note  Patient: Gregory Wiley  Procedure(s) Performed: Procedure(s): ESOPHAGOGASTRODUODENOSCOPY (EGD) WITH PROPOFOL (N/A)  Patient Location: Endoscopy Unit  Anesthesia Type:MAC  Level of Consciousness: awake, alert , oriented and sedated  Airway & Oxygen Therapy: Patient Spontanous Breathing and Patient connected to nasal cannula oxygen  Post-op Assessment: Report given to RN, Post -op Vital signs reviewed and stable and Patient moving all extremities X 4  Post vital signs: Reviewed and stable  Last Vitals:  Filed Vitals:   11/14/14 1115  BP: 91/67  Pulse: 78  Temp: 36.8 C  Resp: 15    Complications: No apparent anesthesia complications

## 2014-11-14 NOTE — Progress Notes (Addendum)
PATIENT DETAILS Name: Gregory Wiley Age: 64 y.o. Sex: male Date of Birth: June 02, 1950 Admit Date: 11/11/2014 Admitting Physician Robbie Lis, MD [id number] Damita Dunnings, MD  Subjective: No vomiting since admission. Feels much better than past few days  Assessment/Plan: Principal Problem: Nausea/vomiting: Ongoing for 1 month, but none since hospitalization. Reviewed prior EGD done in 2015, patient had a gastric ulcer-recommendations were to repeat EGD.Consulted GI-EGD today  Active Problems: Hypokalemia: Secondary to GI loss, repleted.  Acute renal failure: Likely secondary to prerenal azotemia, resolved with IVF  History of CAD: Without chest pain or shortness of breath. Will discontinue aspirin as on Xarelto (prior EGD-+ulcer), continue statin, resume Coreg when BP more stable.  History of pulmonary embolism: In anticipation of EGD-stopped Xarelto, now  on heparin infusion. Resume Xarelto when EGD completed. Reviewed pulmonary consultation in January 2016-recommendations were for lifelong anticoagulation.  History of CVA with residual left-sided hemiplegia: Was on ASA-may need to discontinue-as on anticoagulation-await EGD report  Dyslipidemia: Continue statin  History of hypertension: Although BP improved-still soft-continue to hold Coreg and Lasix. Since BP continues to be soft-will d/c Ranexa as well  Chronic systolic and diastolic heart failure: Last Echo-EF-40% to 45%.Clinically compensated. Resume beta blocker and lasix when able-BP currently  soft  Protein-calorie malnutrition, severe: Continue supplements  Disposition: Remain inpatient  Antimicrobial agents  See below  Anti-infectives    None      DVT Prophylaxis: Xarelto to Heparin gtt  Code Status: Full code  Family Communication Spouse at bedside  Procedures: None  CONSULTS:  GI  Time spent 30 minutes-Greater than 50% of this time was spent in counseling, explanation of diagnosis,  planning of further management, and coordination of care.  MEDICATIONS: Scheduled Meds: . aspirin EC  81 mg Oral Daily  . feeding supplement (ENSURE ENLIVE)  237 mL Oral BID BM  . fluticasone  2 spray Each Nare Daily  . hydrocortisone  25 mg Rectal Daily  . pantoprazole  40 mg Oral Daily  . ranolazine  500 mg Oral BID  . rosuvastatin  40 mg Oral Daily  . [START ON 11/16/2014] Vitamin D (Ergocalciferol)  50,000 Units Oral Q7 days   Continuous Infusions: . sodium chloride 10 mL/hr (11/13/14 1128)   PRN Meds:.acetaminophen, ALPRAZolam, colchicine, meclizine, ondansetron **OR** ondansetron (ZOFRAN) IV    PHYSICAL EXAM: Vital signs in last 24 hours: Filed Vitals:   11/13/14 1434 11/13/14 2016 11/14/14 0527 11/14/14 1115  BP: 93/60  100/50 91/67  Pulse: 71  64 78  Temp: 98.1 F (36.7 C)  97.7 F (36.5 C) 98.3 F (36.8 C)  TempSrc: Axillary  Oral Oral  Resp: 19 16 16 15   Height:      Weight:      SpO2: 98%  97% 97%    Weight change:  Filed Weights   11/11/14 1638 11/13/14 0529  Weight: 99.837 kg (220 lb 1.6 oz) 102.83 kg (226 lb 11.2 oz)   Body mass index is 30.74 kg/(m^2).   Gen Exam: Awake and alert with clear speech.  Neck: Supple, No JVD.   Chest: B/L Clear.   CVS: S1 S2 Regular, no murmurs.  Abdomen: soft, BS +, non tender, non distended.  Extremities: no edema, lower extremities warm to touch. Neurologic: Left hemiplegia   Skin: No Rash.   Wounds: N/A.   Intake/Output from previous day:  Intake/Output Summary (Last 24 hours) at 11/14/14 1123 Last data filed  at 11/14/14 0803  Gross per 24 hour  Intake    120 ml  Output    350 ml  Net   -230 ml     LAB RESULTS: CBC  Recent Labs Lab 11/11/14 1339 11/11/14 1850 11/12/14 0907 11/13/14 0109 11/14/14 0401  WBC 10.4 8.3 7.1 6.1 5.8  HGB 15.4 14.9 14.1 13.2 13.3  HCT 45.2 42.9 41.1 39.3 38.5*  PLT 193 180 155 121* 114*  MCV 82.8 83.6 82.9 84.5 84.2  MCH 28.2 29.0 28.4 28.4 29.1  MCHC 34.1 34.7  34.3 33.6 34.5  RDW 15.2 15.1 15.2 15.4 15.8*  LYMPHSABS  --  1.8  --   --   --   MONOABS  --  0.4  --   --   --   EOSABS  --  0.1  --   --   --   BASOSABS  --  0.0  --   --   --     Chemistries   Recent Labs Lab 11/11/14 1339 11/11/14 1850 11/12/14 0907 11/13/14 1045 11/14/14 0401  NA 132* 134* 135 135 137  K 2.6* 2.8* 2.7* 2.8* 3.8  CL 96* 98* 102 105 106  CO2 20* 24 23 25 25   GLUCOSE 138* 134* 98 112* 86  BUN 16 14 13 7 6   CREATININE 1.65* 1.46* 1.27* 1.14 1.12  CALCIUM 9.2 8.6* 8.6* 8.1* 8.4*  MG  --  2.0  --  1.8  --     CBG:  Recent Labs Lab 11/14/14 0740  GLUCAP 79    GFR Estimated Creatinine Clearance: 82.7 mL/min (by C-G formula based on Cr of 1.12).  Coagulation profile  Recent Labs Lab 11/11/14 1850  INR 3.04*    Cardiac Enzymes No results for input(s): CKMB, TROPONINI, MYOGLOBIN in the last 168 hours.  Invalid input(s): CK  Invalid input(s): POCBNP No results for input(s): DDIMER in the last 72 hours. No results for input(s): HGBA1C in the last 72 hours. No results for input(s): CHOL, HDL, LDLCALC, TRIG, CHOLHDL, LDLDIRECT in the last 72 hours.  Recent Labs  11/11/14 1850  TSH 0.746   No results for input(s): VITAMINB12, FOLATE, FERRITIN, TIBC, IRON, RETICCTPCT in the last 72 hours.  Recent Labs  11/11/14 1339  LIPASE 21*    Urine Studies No results for input(s): UHGB, CRYS in the last 72 hours.  Invalid input(s): UACOL, UAPR, USPG, UPH, UTP, UGL, UKET, UBIL, UNIT, UROB, ULEU, UEPI, UWBC, URBC, UBAC, CAST, UCOM, BILUA  MICROBIOLOGY: No results found for this or any previous visit (from the past 240 hour(s)).  RADIOLOGY STUDIES/RESULTS: Dg Abd 1 View  10/28/2014   CLINICAL DATA:  No bowel movement for the past 2 weeks, nausea and vomiting now  EXAM: ABDOMEN - 1 VIEW  COMPARISON:  None in PACs  FINDINGS: The colonic stool burden is increased diffusely. There is a moderate amount of gas within bowel in the mid abdomen, likely  the sigmoid. There is no small bowel obstructive pattern. There are numerous pelvic phleboliths. No acute bony abnormality is observed.  IMPRESSION: Increase colonic stool burden likely reflects clinical constipation. There is no evidence of obstruction or perforation.   Electronically Signed   By: David  Martinique M.D.   On: 10/28/2014 11:49   Dg Abd Acute W/chest  11/11/2014   CLINICAL DATA:  Patient reports vomiting when trying to stand, duration of symptoms for the past month, history of diverticulosis, tubular adenoma of the colon, and diabetes, also history of  coronary artery disease and previous MI and CABG.  EXAM: DG ABDOMEN ACUTE W/ 1V CHEST  COMPARISON:  KUB of October 28, 2014 and PA and lateral chest x-ray of June 21, 2014.  FINDINGS: Chest x-ray: The lungs are adequately inflated. There is stable density overlying the left cardiac apex likely reflecting atelectasis or scarring. The heart is top-normal in size. The pulmonary vascularity is normal. There are 6 sternal wires. The next the lowest wire is fractured. This is stable. There is an implantable cardiac rhythm recording device present on the left.  Within the abdomen the gas and stool pattern is unremarkable. There are no abnormal soft tissue calcifications. There are phleboliths in the pelvis. The bony structures are unremarkable.  IMPRESSION: There is no active cardiopulmonary disease. There is no acute intra abdominal abnormality.   Electronically Signed   By: David  Martinique M.D.   On: 11/11/2014 14:24    Oren Binet, MD  [facility] Pager:[phone]  If 7PM-7AM, please contact night-coverage [email or web address] Password [email or web address] 11/14/2014, 11:23 AM   LOS: 3 days

## 2014-11-14 NOTE — H&P (View-Only) (Signed)
   Consultation  Referring Provider:     Ghmire Primary Care Physician:  Graham Duncan, MD Primary Gastroenterologist:       Stark  Reason for Consultation:     Chronic nausea and vomiting     Impression / Plan:   Chronic nausea and vomiting Hx antral ulcer 2015 Vertigo Posterior headaches Anticoagulated after PE, Eliquis, INR elevated - may not mean anything - has deccreased renal fx also Hx stroke - left hemiplegia Constipation Weight loss  EGD - likely Monday after xarelto out of system The risks and benefits as well as alternatives of endoscopic procedure(s) have been discussed and reviewed. All questions answered. The patient agrees to proceed.  Some need to f/u antral ulcer   Evaluate vertigo more?  Heparin tx  Daily Miralax not prn to treat constipation  D/w dr. Ghmire          HPI:   Gregory Wiley is a 64 y.o. male with hx of a medium antral ulcer 2015 EGD, neg bxs - had melena before. Since then he had a right MCA stroke, and pulmonary embolus. He reports anorexia, chronic dizziness/vertigo, headaches and nausea and vomiting also. Worsened lately and weaker so admitted. Was seen in the PCP office 2 weeks ago. Other problems include constipation, 100# wgt loss since stroke, .  Past Medical History  Diagnosis Date  . Coronary artery disease     stent, then CABG 07/20/10  . Hypertension   . Hyperlipidemia   . Obesity (BMI 30-39.9)   . H/O cardiomyopathy     ischemic, now last echo 07/30/11, EF 38%W  . Diverticulosis   . Internal hemorrhoids   . Tubular adenoma of colon   . Plantar fasciitis of left foot   . Basal cell carcinoma of skin of left ankle   . NSTEMI (non-ST elevated myocardial infarction)     NSTEMI- last cath 06/2011-Stent to LCX-DES, last nuc 07/30/11 low risk  . MI (myocardial infarction)     "I had 4 before I retired in 2006" (11/11/2014)  . Pulmonary embolism 03/2014  . OSA on CPAP     since July 2013  . GERD (gastroesophageal reflux  disease)   . Bleeding stomach ulcer 2015  . Daily headache     "for the past month" (11/11/2014)  . Stroke 01/2014    "no control of LUE, can slightly LLE; memory problems since" (11/11/2014)  . Arthritis     "back" (11/11/2014)  . Gout   . Depression     Past Surgical History  Procedure Laterality Date  . Coronary artery bypass graft  07/20/2010     LIMA-LAD; VG-ACUTE MARG of RCA; Seq VG-distal RCA & then pda  . Coronary angioplasty with stent placement  07/01/2011    DES-Resolute to native LCX  . Coronary angioplasty with stent placement  12/01/2007    COMPLEX 5 LESION PCI INCLUDING CUTTING BALLOON AND 4 CYPHER DESs  . Knee arthroscopy Right   . Hand surgery Right     to take glass out  . Colonoscopy N/A 02/10/2013    Procedure: COLONOSCOPY;  Surgeon: Malcolm T Stark, MD;  Location: [facility];  Service: Endoscopy;  Laterality: N/A;  . Retinal detachment surgery Right   . Loop recorder implant  02/15/2014    MDT LINQ implanted by Dr Allred for cryptogenic stroke  . Tee without cardioversion N/A 02/15/2014    Procedure: TRANSESOPHAGEAL ECHOCARDIOGRAM (TEE);  Surgeon: Peter C Nishan, MD;  Location: [facility];  Service:   Cardiovascular;  Laterality: N/A;  . Left heart catheterization with coronary/graft angiogram  07/01/2011    Procedure: LEFT HEART CATHETERIZATION WITH CORONARY/GRAFT ANGIOGRAM;  Surgeon: Mihai Croitoru, MD;  Location: [facility]ATH LAB;  Service: Cardiovascular;;  . Percutaneous coronary stent intervention (pci-s) Right 07/01/2011    Procedure: PERCUTANEOUS CORONARY STENT INTERVENTION (PCI-S);  Surgeon: Mihai Croitoru, MD;  Location: [facility]ATH [facility];  Service: Cardiovascular;  Laterality: Right;  . Umbilical hernia repair  2006  . Hernia repair    . Basal cell carcinoma excision Left 2015    ankle  . Esophagogastroduodenoscopy  2015    Family History  Problem Relation Age of Onset  . Diabetes type II Mother   . Coronary artery disease    . Colon cancer Neg Hx   . CAD  Father 53    CABG, PCI, died at 69  . Heart attack Father 62  . Hypertension Brother   . Diabetes Brother   . Hyperlipidemia Brother   . Brain cancer Maternal Grandmother   . COPD Paternal Grandfather   . Other      denies family history of DVT/PE  . Stroke Neg Hx      Social History  Substance Use Topics  . Smoking status: Former Smoker -- 2.00 packs/day for 4 years    Types: Cigarettes  . Smokeless tobacco: Never Used     Comment: "quit smoking cigarettes  in the 1970's"  . Alcohol Use: 0.0 oz/week    0 Standard drinks or equivalent per week     Comment: 11/11/2014 "1-2 times per year"    Prior to Admission medications   Medication Sig Start Date End Date Taking? Authorizing Provider  acetaminophen (TYLENOL) 325 MG tablet Take 2 tablets (650 mg total) by mouth every 6 (six) hours as needed for mild pain. 03/15/14  Yes Pamela S Love, PA-C  ALPRAZolam (XANAX) 0.25 MG tablet Take 0.5 tablets (0.125 mg total) by mouth every 8 (eight) hours as needed for anxiety. 04/14/14  Yes Tiffany L Reed, DO  aspirin 81 MG EC tablet Take 1 tablet (81 mg total) by mouth daily. 09/14/14  Yes Thomas A Kelly, MD  carvedilol (COREG) 3.125 MG tablet Take 1 tablet (3.125 mg total) by mouth 2 (two) times daily with a meal. 10/06/14  Yes Graham S Duncan, MD  colchicine 0.6 MG tablet Take 0.6 mg by mouth as needed (for gout).  04/09/14  Yes Historical Provider, MD  feeding supplement (BOOST HIGH PROTEIN) LIQD Take 1 Container by mouth as directed.   Yes Historical Provider, MD  fluticasone (FLONASE) 50 MCG/ACT nasal spray Place 2 sprays into both nostrils daily. 03/15/14  Yes Pamela S Love, PA-C  furosemide (LASIX) 40 MG tablet Take 40 mg by mouth as needed for edema.   Yes Historical Provider, MD  hydrocortisone (ANUSOL-HC) 25 MG suppository Place 1 suppository (25 mg total) rectally daily. X 1 week; then, use as needed thereafter. 07/26/14  Yes Jay M Pyrtle, MD  meclizine (ANTIVERT) 25 MG tablet Take 1 tablet  (25 mg total) by mouth 3 (three) times daily as needed for dizziness. Patient taking differently: Take 25 mg by mouth 3 (three) times daily as needed for dizziness (bed-time).  03/15/14  Yes Pamela S Love, PA-C  Melatonin 10 MG CAPS Take 1 capsule by mouth at bedtime.   Yes Historical Provider, MD  methylphenidate (RITALIN) 10 MG tablet Take up to 3 times a day po 10/26/14  Yes Richard A Sater, MD  OVER THE   COUNTER MEDICATION Inhale 1 application into the lungs at bedtime. C-PAP   Yes Historical Provider, MD  pantoprazole (PROTONIX) 40 MG tablet TAKE 1 TABLET BY MOUTH 2 TIMES A DAY. 09/15/14  Yes Thomas A Kelly, MD  polyethylene glycol powder (GLYCOLAX/MIRALAX) powder Take 1 Container by mouth 3 (three) times daily.   Yes Historical Provider, MD  promethazine (PHENERGAN) 12.5 MG tablet Take 1-2 tablets (12.5-25 mg total) by mouth every 6 (six) hours as needed for nausea or vomiting. 10/28/14  Yes Katherine K Clark, NP  ranolazine (RANEXA) 500 MG 12 hr tablet Take 1 tablet (500 mg total) by mouth 2 (two) times daily. 05/25/14  Yes Thomas A Kelly, MD  rivaroxaban (XARELTO) 20 MG TABS tablet Take 1 tablet (20 mg total) by mouth daily with supper. 05/05/14  Yes Richard A Sater, MD  rosuvastatin (CRESTOR) 40 MG tablet Take 1 tablet (40 mg total) by mouth daily. 09/21/14  Yes Thomas A Kelly, MD  Sennosides (SENOKOT PO) Take 2 tablets by mouth daily.   Yes Historical Provider, MD  Vitamin D, Ergocalciferol, (DRISDOL) 50000 UNITS CAPS capsule Take 50,000 Units by mouth every 7 (seven) days. Take on Mondays   Yes Historical Provider, MD  nitroGLYCERIN (NITROSTAT) 0.4 MG SL tablet Place 0.4 mg under the tongue every 5 (five) minutes as needed. For chest pain    Historical Provider, MD    Current Facility-Administered Medications  Medication Dose Route Frequency Provider Last Rate Last Dose  . 0.9 %  sodium chloride infusion   Intravenous Continuous Alma M Devine, MD 75 mL/hr at 11/12/14 0643    . acetaminophen  (TYLENOL) tablet 650 mg  650 mg Oral Q6H PRN Alma M Devine, MD      . ALPRAZolam (XANAX) tablet 0.125 mg  0.125 mg Oral Q8H PRN Alma M Devine, MD      . aspirin EC tablet 81 mg  81 mg Oral Daily Alma M Devine, MD   81 mg at 11/12/14 1030  . colchicine tablet 0.6 mg  0.6 mg Oral PRN Alma M Devine, MD      . feeding supplement (ENSURE ENLIVE) (ENSURE ENLIVE) liquid 237 mL  237 mL Oral BID BM Alma M Devine, MD      . fluticasone (FLONASE) 50 MCG/ACT nasal spray 2 spray  2 spray Each Nare Daily Alma M Devine, MD   2 spray at 11/11/14 1722  . hydrocortisone (ANUSOL-HC) suppository 25 mg  25 mg Rectal Daily Alma M Devine, MD   25 mg at 11/11/14 1722  . meclizine (ANTIVERT) tablet 25 mg  25 mg Oral TID PRN Alma M Devine, MD      . ondansetron (ZOFRAN) tablet 4 mg  4 mg Oral Q6H PRN Alma M Devine, MD       Or  . ondansetron (ZOFRAN) injection 4 mg  4 mg Intravenous Q6H PRN Alma M Devine, MD      . pantoprazole (PROTONIX) EC tablet 40 mg  40 mg Oral Daily Alma M Devine, MD   40 mg at 11/12/14 1030  . ranolazine (RANEXA) 12 hr tablet 500 mg  500 mg Oral BID Alma M Devine, MD   500 mg at 11/12/14 1030  . rosuvastatin (CRESTOR) tablet 40 mg  40 mg Oral Daily Alma M Devine, MD   40 mg at 11/12/14 1030  . [START ON 11/16/2014] Vitamin D (Ergocalciferol) (DRISDOL) capsule 50,000 Units  50,000 Units Oral Q7 days Alma M Devine, MD          Allergies as of 11/11/2014 - Review Complete 11/11/2014  Allergen Reaction Noted  . Diphenhydramine hcl Anaphylaxis 06/30/2011     Review of Systems:    This is positive for those things mentioned in the HPI All other review of systems are negative.       Physical Exam:  Vital signs in last 24 hours: Temp:  [97.7 F (36.5 C)-98.3 F (36.8 C)] 98.3 F (36.8 C) (08/27 0628) Pulse Rate:  [71-118] 73 (08/27 0628) Resp:  [15-20] 15 (08/27 0628) BP: (81-101)/(47-74) 93/60 mmHg (08/27 0628) SpO2:  [94 %-98 %] 97 % (08/27 0628) Weight:  [220 lb 1.6 oz (99.837 kg)] 220  lb 1.6 oz (99.837 kg) (08/26 1638) Last BM Date: 11/07/14  General:  Well-developed, well-nourished and in no acute distress Eyes:  anicteric. Neck:   supple w/o  or mass.  Lungs: Clear to auscultation bilaterally. Heart:  S1S2, no rubs, murmurs, gallops. Abdomen:  obese soft, non-tender, no hepatosplenomegaly, hernia, or mass and BS+.   Lymph:  no cervical or supraclavicular adenopathy. Extremities:   no edema Skin   no rash. Neuro:  A&O x 3. Left hemiplegia, no nystagmus Psych:  appropriate mood and  Affect.   Data Reviewed:   LAB RESULTS:  Recent Labs  11/11/14 1339 11/11/14 1850 11/12/14 0907  WBC 10.4 8.3 7.1  HGB 15.4 14.9 14.1  HCT 45.2 42.9 41.1  PLT 193 180 155   BMET  Recent Labs  11/11/14 1339 11/11/14 1850  NA 132* 134*  K 2.6* 2.8*  CL 96* 98*  CO2 20* 24  GLUCOSE 138* 134*  BUN 16 14  CREATININE 1.65* 1.46*  CALCIUM 9.2 8.6*   LFT  Recent Labs  11/11/14 1339  PROT 7.6  ALBUMIN 3.5  AST 21  ALT 14*  ALKPHOS 73  BILITOT 1.4*   PT/INR  Recent Labs  11/11/14 1850  LABPROT 30.9*  INR 3.04*    STUDIES: Dg Abd Acute W/chest  11/11/2014   CLINICAL DATA:  Patient reports vomiting when trying to stand, duration of symptoms for the past month, history of diverticulosis, tubular adenoma of the colon, and diabetes, also history of coronary artery disease and previous MI and CABG.  EXAM: DG ABDOMEN ACUTE W/ 1V CHEST  COMPARISON:  KUB of October 28, 2014 and PA and lateral chest x-ray of June 21, 2014.  FINDINGS: Chest x-ray: The lungs are adequately inflated. There is stable density overlying the left cardiac apex likely reflecting atelectasis or scarring. The heart is top-normal in size. The pulmonary vascularity is normal. There are 6 sternal wires. The next the lowest wire is fractured. This is stable. There is an implantable cardiac rhythm recording device present on the left.  Within the abdomen the gas and stool pattern is unremarkable. There  are no abnormal soft tissue calcifications. There are phleboliths in the pelvis. The bony structures are unremarkable.  IMPRESSION: There is no active cardiopulmonary disease. There is no acute intra abdominal abnormality.   Electronically Signed   By: David  Jordan M.D.   On: 11/11/2014 14:24     PREVIOUS ENDOSCOPIES:            EGD 2015 colonoscopies Thanks   LOS: 1 day   @Carl E. Gessner, MD, FACG @  11/12/2014, 10:38 AM   

## 2014-11-15 ENCOUNTER — Inpatient Hospital Stay (HOSPITAL_COMMUNITY): Payer: 59

## 2014-11-15 ENCOUNTER — Encounter (HOSPITAL_COMMUNITY): Payer: Self-pay | Admitting: Physician Assistant

## 2014-11-15 MED ORDER — IOHEXOL 350 MG/ML SOLN
80.0000 mL | Freq: Once | INTRAVENOUS | Status: AC | PRN
  Administered 2014-11-15: 80 mL via INTRAVENOUS

## 2014-11-15 NOTE — Progress Notes (Signed)
Pt BP 82/42, manual.  Pt asymptomatic.  NP Baltazar Najjar paged.  250 cc NS bolus ordered/given.  Retake BP 92/46.  Will continue to monitor pt closely.  Claudette Stapler, RN

## 2014-11-15 NOTE — Progress Notes (Signed)
   11/15/14 2116  BiPAP/CPAP/SIPAP  BiPAP/CPAP/SIPAP Pt Type Adult  Mask Type Full face mask  Mask Size Large  Respiratory Rate 16 breaths/min  IPAP 15 cmH20  EPAP 5 cmH2O  Oxygen Percent 21 %  BiPAP/CPAP/SIPAP CPAP  Patient Home Equipment No  Auto Titrate Yes  Patient placed on CPAP full face mask , Auto Mode Imax =15 and E Min =5. He tolerates it very well at this time.

## 2014-11-15 NOTE — Progress Notes (Signed)
PATIENT DETAILS Name: Gregory Wiley Age: 64 y.o. Sex: male Date of Birth: Jan 24, 1951 Admit Date: 11/11/2014 Admitting Physician Robbie Lis, MD [id number] Damita Dunnings, MD  Subjective: No vomiting since admission.Feels lightheaded on standing up-going on since Nov 2015 (after CVA)  Assessment/Plan: Principal Problem: Nausea/vomiting: Ongoing for 1 month, but none since hospitalization. Reviewed prior EGD done in 2015, patient had a gastric ulcer-recommendations were to repeat EGD.Consulted GI-EGD 8/29-showed no acute abnormalities. GI suspects central etiology-spoke with Neuro over the phone-recommendations are check CTA Head/Neck.   Active Problems: Hypokalemia: Secondary to GI loss, repleted.  Acute renal failure: Likely secondary to prerenal azotemia, resolved with IVF  Lightheadedness/Dizziness:mostly on standing. Orthostatics negative. Spoke with Neuro over the phone-recommendations are check CTA Head/Neck.   History of CAD: Without chest pain or shortness of breath. Will discontinue aspirin as on Xarelto (prior EGD-+ulcer), continue statin, resume Coreg when BP more stable.  History of pulmonary embolism: In anticipation of EGD-stopped Xarelto, now  on heparin infusion. Resume Xarelto when EGD completed. Reviewed pulmonary consultation in January 2016-recommendations were for lifelong anticoagulation.  History of CVA with residual left-sided hemiplegia: Was on ASA- discontinued-as on anticoagulation (spoke with primary cards-Dr Claiborne Billings over the phone 8/29-agrees)  Dyslipidemia: Continue statin  History of hypertension: Although BP improved-still soft-continue to hold Coreg, Ranexa and Lasix.   Chronic systolic and diastolic heart failure: Last Echo-EF-40% to 45%.Clinically compensated. Resume beta blocker and lasix when able-BP currently  soft  Protein-calorie malnutrition, severe: Continue supplements  Disposition: Remain inpatient-home later today or in  am  Antimicrobial agents  See below  Anti-infectives    None      DVT Prophylaxis: Xarelto to Heparin gtt  Code Status: Full code  Family Communication Spouse at bedside  Procedures: None  CONSULTS:  GI  Time spent 30 minutes-Greater than 50% of this time was spent in counseling, explanation of diagnosis, planning of further management, and coordination of care.  MEDICATIONS: Scheduled Meds: . feeding supplement (ENSURE ENLIVE)  237 mL Oral BID BM  . fluticasone  2 spray Each Nare Daily  . hydrocortisone  25 mg Rectal Daily  . ondansetron  4 mg Oral Q12H  . pantoprazole  40 mg Oral Daily  . rivaroxaban  20 mg Oral Q supper  . rosuvastatin  40 mg Oral Daily  . [START ON 11/16/2014] Vitamin D (Ergocalciferol)  50,000 Units Oral Q7 days   Continuous Infusions: . sodium chloride 75 mL/hr at 11/14/14 2122   PRN Meds:.acetaminophen, ALPRAZolam, colchicine, meclizine    PHYSICAL EXAM: Vital signs in last 24 hours: Filed Vitals:   11/14/14 2153 11/15/14 0300 11/15/14 0456 11/15/14 0524  BP: 92/46  90/50 108/69  Pulse:    67  Temp:    98.4 F (36.9 C)  TempSrc:    Oral  Resp:    18  Height:      Weight:  103 kg (227 lb 1.2 oz)    SpO2:    98%    Weight change:  Filed Weights   11/11/14 1638 11/13/14 0529 11/15/14 0300  Weight: 99.837 kg (220 lb 1.6 oz) 102.83 kg (226 lb 11.2 oz) 103 kg (227 lb 1.2 oz)   Body mass index is 30.79 kg/(m^2).   Gen Exam: Awake and alert with clear speech.  Neck: Supple, No JVD.   Chest: B/L Clear.   CVS: S1 S2 Regular, no murmurs.  Abdomen: soft, BS +, non  tender, non distended.  Extremities: no edema, lower extremities warm to touch. Neurologic: Left hemiplegia   Skin: No Rash.   Wounds: N/A.   Intake/Output from previous day:  Intake/Output Summary (Last 24 hours) at 11/15/14 1213 Last data filed at 11/15/14 1031  Gross per 24 hour  Intake    380 ml  Output    800 ml  Net   -420 ml     LAB  RESULTS: CBC  Recent Labs Lab 11/11/14 1339 11/11/14 1850 11/12/14 0907 11/13/14 0109 11/14/14 0401  WBC 10.4 8.3 7.1 6.1 5.8  HGB 15.4 14.9 14.1 13.2 13.3  HCT 45.2 42.9 41.1 39.3 38.5*  PLT 193 180 155 121* 114*  MCV 82.8 83.6 82.9 84.5 84.2  MCH 28.2 29.0 28.4 28.4 29.1  MCHC 34.1 34.7 34.3 33.6 34.5  RDW 15.2 15.1 15.2 15.4 15.8*  LYMPHSABS  --  1.8  --   --   --   MONOABS  --  0.4  --   --   --   EOSABS  --  0.1  --   --   --   BASOSABS  --  0.0  --   --   --     Chemistries   Recent Labs Lab 11/11/14 1339 11/11/14 1850 11/12/14 0907 11/13/14 1045 11/14/14 0401  NA 132* 134* 135 135 137  K 2.6* 2.8* 2.7* 2.8* 3.8  CL 96* 98* 102 105 106  CO2 20* 24 23 25 25   GLUCOSE 138* 134* 98 112* 86  BUN 16 14 13 7 6   CREATININE 1.65* 1.46* 1.27* 1.14 1.12  CALCIUM 9.2 8.6* 8.6* 8.1* 8.4*  MG  --  2.0  --  1.8  --     CBG:  Recent Labs Lab 11/14/14 0740  GLUCAP 79    GFR Estimated Creatinine Clearance: 82.7 mL/min (by C-G formula based on Cr of 1.12).  Coagulation profile  Recent Labs Lab 11/11/14 1850  INR 3.04*    Cardiac Enzymes No results for input(s): CKMB, TROPONINI, MYOGLOBIN in the last 168 hours.  Invalid input(s): CK  Invalid input(s): POCBNP No results for input(s): DDIMER in the last 72 hours. No results for input(s): HGBA1C in the last 72 hours. No results for input(s): CHOL, HDL, LDLCALC, TRIG, CHOLHDL, LDLDIRECT in the last 72 hours. No results for input(s): TSH, T4TOTAL, T3FREE, THYROIDAB in the last 72 hours.  Invalid input(s): FREET3 No results for input(s): VITAMINB12, FOLATE, FERRITIN, TIBC, IRON, RETICCTPCT in the last 72 hours. No results for input(s): LIPASE, AMYLASE in the last 72 hours.  Urine Studies No results for input(s): UHGB, CRYS in the last 72 hours.  Invalid input(s): UACOL, UAPR, USPG, UPH, UTP, UGL, UKET, UBIL, UNIT, UROB, ULEU, UEPI, UWBC, URBC, UBAC, CAST, UCOM, BILUA  MICROBIOLOGY: No results found for  this or any previous visit (from the past 240 hour(s)).  RADIOLOGY STUDIES/RESULTS: Dg Abd 1 View  10/28/2014   CLINICAL DATA:  No bowel movement for the past 2 weeks, nausea and vomiting now  EXAM: ABDOMEN - 1 VIEW  COMPARISON:  None in PACs  FINDINGS: The colonic stool burden is increased diffusely. There is a moderate amount of gas within bowel in the mid abdomen, likely the sigmoid. There is no small bowel obstructive pattern. There are numerous pelvic phleboliths. No acute bony abnormality is observed.  IMPRESSION: Increase colonic stool burden likely reflects clinical constipation. There is no evidence of obstruction or perforation.   Electronically Signed   By: Shanon Brow  [place] M.D.   On: 10/28/2014 11:49   Dg Abd Acute W/chest  11/11/2014   CLINICAL DATA:  Patient reports vomiting when trying to stand, duration of symptoms for the past month, history of diverticulosis, tubular adenoma of the colon, and diabetes, also history of coronary artery disease and previous MI and CABG.  EXAM: DG ABDOMEN ACUTE W/ 1V CHEST  COMPARISON:  KUB of October 28, 2014 and PA and lateral chest x-ray of June 21, 2014.  FINDINGS: Chest x-ray: The lungs are adequately inflated. There is stable density overlying the left cardiac apex likely reflecting atelectasis or scarring. The heart is top-normal in size. The pulmonary vascularity is normal. There are 6 sternal wires. The next the lowest wire is fractured. This is stable. There is an implantable cardiac rhythm recording device present on the left.  Within the abdomen the gas and stool pattern is unremarkable. There are no abnormal soft tissue calcifications. There are phleboliths in the pelvis. The bony structures are unremarkable.  IMPRESSION: There is no active cardiopulmonary disease. There is no acute intra abdominal abnormality.   Electronically Signed   By: David  Martinique M.D.   On: 11/11/2014 14:24    Oren Binet, MD  [facility] Pager:[phone]  If  7PM-7AM, please contact night-coverage [email or web address] Password [email or web address] 11/15/2014, 12:13 PM   LOS: 4 days

## 2014-11-15 NOTE — Care Management Note (Signed)
Case Management Note  Patient Details  Name: Gregory Wiley MRN: [id number] Date of Birth: 07/08/1950  Subjective/Objective:      Patient lives with wife, he has transportation, patient has insurance, he chose [place] for [employer], referral made to [employer] with [place].  Soc will begin 24-48 hrs post dc.  NCM will cont to follow for dc needs.              Action/Plan:   Expected Discharge Date:                  Expected Discharge Plan:  [place]  In-House Referral:     Discharge planning Services  CM Consult  Post Acute Care Choice:  Home Health Choice offered to:  Patient  DME Arranged:    DME Agency:     HH Arranged:  PT HH Agency:  [place]  Status of Service:  Completed, signed off  Medicare Important Message Given:    Date Medicare IM Given:    Medicare IM give by:    Date Additional Medicare IM Given:    Additional Medicare Important Message give by:     If discussed at [place] of Stay Meetings, dates discussed:    Additional Comments:  Zenon Mayo, RN 11/15/2014, 12:59 PM

## 2014-11-15 NOTE — Progress Notes (Signed)
Daily Rounding Note  11/15/2014, 8:43 AM  LOS: 4 days   SUBJECTIVE:       No nausea or vomiting since admission.  Head feels "foggy" but not specifically dizzy.    OBJECTIVE:         Vital signs in last 24 hours:    Temp:  [97.8 F (36.6 C)-98.4 F (36.9 C)] 98.4 F (36.9 C) (08/30 0524) Pulse Rate:  [62-78] 67 (08/30 0524) Resp:  [14-19] 18 (08/30 0524) BP: (72-108)/(41-69) 108/69 mmHg (08/30 0524) SpO2:  [97 %-100 %] 98 % (08/30 0524) Weight:  [227 lb 1.2 oz (103 kg)] 227 lb 1.2 oz (103 kg) (08/30 0300) Last BM Date: 11/15/14 Filed Weights   11/11/14 1638 11/13/14 0529 11/15/14 0300  Weight: 220 lb 1.6 oz (99.837 kg) 226 lb 11.2 oz (102.83 kg) 227 lb 1.2 oz (103 kg)   General: pleasant, comfortable, obese   Heart: RRR Chest: clear bil. No labored breathing or cough Abdomen: + active BS, NT, soft.  Extremities: no CCE.  Neuro/Psych:  Pleasant, alert, oriented x 3.    Intake/Output from previous day: 08/29 0701 - 08/30 0700 In: 320 [P.O.:120; I.V.:200] Out: 500 [Urine:500]  Intake/Output this shift:    Lab Results:  Recent Labs  11/12/14 0907 11/13/14 0109 11/14/14 0401  WBC 7.1 6.1 5.8  HGB 14.1 13.2 13.3  HCT 41.1 39.3 38.5*  PLT 155 121* 114*   BMET  Recent Labs  11/12/14 0907 11/13/14 1045 11/14/14 0401  NA 135 135 137  K 2.7* 2.8* 3.8  CL 102 105 106  CO2 23 25 25   GLUCOSE 98 112* 86  BUN 13 7 6   CREATININE 1.27* 1.14 1.12  CALCIUM 8.6* 8.1* 8.4*   LFT No results for input(s): PROT, ALBUMIN, AST, ALT, ALKPHOS, BILITOT, BILIDIR, IBILI in the last 72 hours. PT/INR No results for input(s): LABPROT, INR in the last 72 hours. Hepatitis Panel No results for input(s): HEPBSAG, HCVAB, HEPAIGM, HEPBIGM in the last 72 hours.  Studies/Results: No results found.   Scheduled Meds: . feeding supplement (ENSURE ENLIVE)  237 mL Oral BID BM  . fluticasone  2 spray Each Nare Daily  .  hydrocortisone  25 mg Rectal Daily  . ondansetron  4 mg Oral Q12H  . pantoprazole  40 mg Oral Daily  . rivaroxaban  20 mg Oral Q supper  . rosuvastatin  40 mg Oral Daily  . [START ON 11/16/2014] Vitamin D (Ergocalciferol)  50,000 Units Oral Q7 days   Continuous Infusions: . sodium chloride 75 mL/hr at 11/14/14 2122   PRN Meds:.acetaminophen, ALPRAZolam, colchicine, meclizine   ASSESMENT:   *  Chronic N/V, weight loss; gastric ulcer and erosive gastroduodenitis 01/2014 (melena and H Pylori negative then).  Does not experience post prandial abdominal pain.  On BID PPI at home.  11/14/14 EGD: mild, non-specific gastritis. HH and Schatzki's ring (not dilated).  Biopsies pending.  Suspect sxs are centrally mediated as pt has associated dizziness, headaches post CVA 2015.  Started on scheduled antiemetics. However n/v had already resolved before this was initiated.   *  03/2014 PE. Hx CVA.  On Xarelto.  *  CAD.  Hx of multiple MI.  S/p multiple PCI/stents and CABG.     PLAN   *  Once daily PPI.  ? Should he remain on scheduled Zofran chronically?  *  Biopsy and H pylori testing pending.  If H pylori is +, can call in  appropriate abx therapy     Azucena Freed  11/15/2014, 8:43 AM Pager: [phone]   ________________________________________________________________________  Velora Heckler GI MD note:  I personally examined the patient, reviewed the data.  Biopsy from EGD yesterday shows no H. Pylori.  Not sure that his nausea is GI related, I see that neuro recommended CTA head/neck.  I think he should remain on zofran twice daily for now.   Owens Loffler, MD [facility] Pager [phone]

## 2014-11-15 NOTE — Progress Notes (Signed)
Physical Therapy Treatment Patient Details Name: Gregory Wiley MRN: [id number] DOB: 05/26/1950 Today's Date: 11/15/2014    History of Present Illness Pt is a 64 y/o M admitted  w/ ongoing vomiting for 2 wks.  Current diagnosis is dehydration and failure to thrive.  Pt's PMH inlcudes HTN, CAD, CHF, CVA w/ residual Lt sided weakness.    PT Comments    Progressing towards functional goals. Tolerated slightly further distance of ambulation today however, still reports lightheadedness in upright positions, not exacerbated by head various head position changes. BP in supine 99/67, HR 81; BP in standing after ambulating 115/87, HR 139. Patient will continue to benefit from skilled physical therapy services to further improve independence with functional mobility.   Follow Up Recommendations  Home health PT;Supervision/Assistance - 24 hour     Equipment Recommendations  None recommended by PT    Recommendations for Other Services OT consult     Precautions / Restrictions Precautions Precautions: Fall Precaution Comments: pt has fallen at home and has been getting dizzy while up, describes as spinning, then tunnel vision, then ears get hot, then nausea Restrictions Weight Bearing Restrictions: No    Mobility  Bed Mobility Overal bed mobility: Needs Assistance Bed Mobility: Supine to Sit;Sit to Sidelying     Supine to sit: Mod assist;HOB elevated   Sit to sidelying: Min assist General bed mobility comments: Mod assist for truncal support to rise to EOB pulling through PTs hand with RUE as well. Min assist for LE support back into bed.  Transfers Overall transfer level: Needs assistance Equipment used: Quad cane Transfers: Sit to/from Stand Sit to Stand: Min assist         General transfer comment: Min assist for boost from lowest bed setting and from low chair with arm rests. VC for hand placement, anterior weight shift, and to scoot to edge of surface prior to attempting to  stand.  Ambulation/Gait Ambulation/Gait assistance: Min assist Ambulation Distance (Feet): 20 Feet (x2) Assistive device: Quad cane Gait Pattern/deviations: Step-to pattern;Decreased step length - left;Decreased stance time - left;Decreased dorsiflexion - left;Decreased weight shift to left;Wide base of support (hemiparetic gait, circumducts LLE) Gait velocity: decreased Gait velocity interpretation: <1.8 ft/sec, indicative of risk for recurrent falls General Gait Details: Most of distance min guard for safety however as pt becomes increasingly "dizzy" requires min assist due to Rt lateral lean. VC for LLE clearance with AFO on. Reports 8/10 "dizziness but not dizzy"  after ambulating 20 feet (4/10 sitting at rest).  Reports symptoms improve with sitting.   Stairs            Wheelchair Mobility    Modified Rankin (Stroke Patients Only)       Balance                                    Cognition Arousal/Alertness: Awake/alert Behavior During Therapy: WFL for tasks assessed/performed Overall Cognitive Status: Within Functional Limits for tasks assessed                      Exercises      General Comments General comments (skin integrity, edema, etc.): BP in standing 115/87 - HR 139. Supine BP 99/67, HR 81.      Pertinent Vitals/Pain Pain Assessment: No/denies pain    Home Living  Prior Function            PT Goals (current goals can now be found in the care plan section) Acute Rehab PT Goals Patient Stated Goal: to determine if he can go home  PT Goal Formulation: With patient/family Time For Goal Achievement: 11/27/14 Potential to Achieve Goals: Fair Progress towards PT goals: Progressing toward goals    Frequency  Min 5X/week    PT Plan Current plan remains appropriate    Co-evaluation             End of Session Equipment Utilized During Treatment: Gait belt Activity Tolerance: Other  (comment);Treatment limited secondary to medical complications (Comment) (dizziness) Patient left: with call bell/phone within reach;with family/visitor present;in bed;with SCD's reapplied;with bed alarm set     Time: 1424-1501 PT Time Calculation (min) (ACUTE ONLY): 37 min  Charges:  $Gait Training: 8-22 mins $Therapeutic Activity: 8-22 mins                    G Codes:      Gregory Wiley 11/18/2014, 3:47 PM [address] [place], [place]

## 2014-11-15 NOTE — Progress Notes (Addendum)
RN contacted by MRI.  Procedure will erase data in loop recorder - NP Baltazar Najjar paged/notified.  MRI can be done tomorrow in the event patient's loop recorder needs to be interrogated beforehand.  Will cont to monitor pt.  Claudette Stapler, RN

## 2014-11-16 DIAGNOSIS — R1110 Vomiting, unspecified: Secondary | ICD-10-CM

## 2014-11-16 MED ORDER — ONDANSETRON HCL 4 MG PO TABS: 4.0000 mg | ORAL_TABLET | Freq: Two times a day (BID) | ORAL | Status: DC

## 2014-11-16 MED ORDER — ENSURE ENLIVE PO LIQD: 237.0000 mL | Freq: Two times a day (BID) | ORAL | Status: DC

## 2014-11-16 NOTE — Progress Notes (Signed)
Physical Therapy Treatment Patient Details Name: Gregory Wiley MRN: [id number] DOB: June 05, 1950 Today's Date: 11/16/2014    History of Present Illness Pt is a 64 y/o M admitted  w/ ongoing vomiting for 2 wks.  Current diagnosis is dehydration and failure to thrive.  Pt's PMH inlcudes HTN, CAD, CHF, CVA w/ residual Lt sided weakness.    PT Comments    Plan is for dc home today, and that is fine from a PT standpoint; Pt and wife had many questions re: therapeutic options, deferred all these questions to his neurologist; the "fog" that pt describes feeling reamins largely unchanged;   Gregory Wiley are expecting HHPT follow up, and that is indicated; I also encourage them to pursue Outpatient PT and OT after [facility] therapy course.  See vitals flow sheet for recorded BPs    Follow Up Recommendations  Home health PT;Supervision/Assistance - 24 hour     Equipment Recommendations  None recommended by PT    Recommendations for Other Services OT consult     Precautions / Restrictions Precautions Precautions: Fall Precaution Comments: pt has fallen at home and has been getting dizzy while up, describes as spinning, then tunnel vision, then ears get hot, then nausea Required Braces or Orthoses: Other Brace/Splint Other Brace/Splint: Uses own AFO LLE    Mobility  Bed Mobility Overal bed mobility: Needs Assistance Bed Mobility: Rolling;Sidelying to Sit Rolling: Min guard (Using R UE to reach across to rail and roll to L) Sidelying to sit: Mod assist       General bed mobility comments: Mod assist with PT hooked pt's R arm at elbow to push/pull up to sit; cues to keep hips flexed during transition form sidelie to sit  Transfers Overall transfer level: Needs assistance Equipment used: Quad cane Transfers: Sit to/from Stand Sit to Stand: Min assist         General transfer comment: Min assist for boost from lowest bed setting and from low chair with arm rests. VC for hand  placement, anterior weight shift, and to scoot to edge of surface prior to attempting to stand. Dependent on momentum; Wife present and gave adequate assist  Ambulation/Gait Ambulation/Gait assistance: Min assist (provided by wife) Ambulation Distance (Feet): 20 Feet (x2) Assistive device: Quad cane Gait Pattern/deviations: Step-to pattern;Decreased step length - left;Decreased stance time - left;Decreased step length - right;Decreased dorsiflexion - left;Decreased weight shift to left (hemi gait) Gait velocity: decreased Gait velocity interpretation: <1.8 ft/sec, indicative of risk for recurrent falls General Gait Details: Wife provided assist during ambulation; cues to self-monitor for activity tolerance; Reports no changes in his dizziness during activiyt today; describes it as a Psychologist, clinical    Modified Rankin (Stroke Patients Only)       Balance             Standing balance-Leahy Scale: Poor                      Cognition Arousal/Alertness: Awake/alert Behavior During Therapy: WFL for tasks assessed/performed Overall Cognitive Status: Within Functional Limits for tasks assessed                      Exercises      General Comments General comments (skin integrity, edema, etc.): Lengthy discussion with Gregory Wiley re: results of inquiry into Orthostatic BPs (no orthostatic hypotension); they had many  questions re: therapeutic agents, medications, INR, segments posted on YouTube showing therapeutic effects of several natural agents; I encouraged them to discuss these with Mr. Mcbroom's neurologist      Pertinent Vitals/Pain Pain Assessment: No/denies pain    Home Living                      Prior Function            PT Goals (current goals can now be found in the care plan section) Acute Rehab PT Goals Patient Stated Goal: to determine if he can go home  PT Goal Formulation: With  patient/family Time For Goal Achievement: 11/27/14 Potential to Achieve Goals: Fair Progress towards PT goals: Progressing toward goals (hopeful for dc today)    Frequency  Min 5X/week    PT Plan Current plan remains appropriate    Co-evaluation             End of Session Equipment Utilized During Treatment: Gait belt Activity Tolerance: Patient tolerated treatment well Patient left: in chair;with call bell/phone within reach;with family/visitor present     Time: 5093-2671 PT Time Calculation (min) (ACUTE ONLY): 47 min  Charges:  $Gait Training: 8-22 mins $Therapeutic Activity: 23-37 mins                    G Codes:      Quin Hoop 11/16/2014, 11:20 AM  Roney [name], [place] Pager [phone] Office [phone]

## 2014-11-16 NOTE — Progress Notes (Signed)
Patient and spouse received discharge instructions and prescriptions with verbal understanding. Patient discharged to home with spouse.

## 2014-11-16 NOTE — Discharge Summary (Signed)
PATIENT DETAILS Name: Gregory Wiley Age: 64 y.o. Sex: male Date of Birth: 1950/12/09 MRN: [id number]. Admitting Physician: Robbie Lis, MD [id number] Damita Dunnings, MD  Admit Date: 11/11/2014 Discharge date: 11/16/2014  Recommendations for Outpatient Follow-up:  1. Please ensure follow-up with neurology-having lightheadedness since patient had CVA in 2015 ( 2. Please ensure follow-up with Dr. Kelley-cardiology  3. 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 Coronary artery disease     stent, then CABG 07/20/10  . Hypertension   . Hyperlipidemia   . Obesity (BMI 30-39.9)   . H/O cardiomyopathy     ischemic, now last echo 07/30/11, EF 38%W  . Diverticulosis   . Internal hemorrhoids   . Tubular adenoma of colon   . Plantar fasciitis of left foot   . Basal cell carcinoma of skin of left ankle   . NSTEMI (non-ST elevated myocardial infarction)     NSTEMI- last cath 06/2011-Stent to LCX-DES, last nuc 07/30/11 low risk  . MI (myocardial infarction)     "I had 4 before I retired in 2006" (11/11/2014)  . Pulmonary embolism 03/2014  . OSA on CPAP     since July 2013  . GERD (gastroesophageal reflux disease)   . Bleeding stomach ulcer 2015  . Daily headache     "for the past month" (11/11/2014)  . Stroke 01/2014    "no control of LUE, can slightly LLE; memory problems since" (11/11/2014)  . Arthritis    "back" (11/11/2014)  . Gout   . Depression     DISCHARGE MEDICATIONS: Current Discharge Medication List    START taking these medications   Details  ondansetron (ZOFRAN) 4 MG tablet Take 1 tablet (4 mg total) by mouth every 12 (twelve) hours. Qty: 60 tablet, Refills: 0      CONTINUE these medications which have CHANGED   Details  feeding supplement, ENSURE ENLIVE, (ENSURE ENLIVE) LIQD Take 237 mLs by mouth 2 (two) times daily between meals. Qty: 60 Bottle, Refills: 0      CONTINUE these medications which have NOT CHANGED   Details  acetaminophen (TYLENOL) 325 MG tablet Take 2 tablets (650 mg total) by mouth every 6 (six) hours as needed for mild pain.    ALPRAZolam (XANAX) 0.25 MG tablet Take 0.5 tablets (0.125 mg total) by mouth every 8 (eight) hours as needed for anxiety. Qty: 45 tablet, Refills: 5    colchicine 0.6 MG tablet Take 0.6 mg by mouth as needed (for gout).  Refills: 0    fluticasone (FLONASE) 50 MCG/ACT nasal spray Place 2 sprays into both nostrils daily. Refills: 2    hydrocortisone (ANUSOL-HC) 25 MG suppository Place 1 suppository (25 mg total) rectally daily. X 1 week; then, use as needed thereafter. Qty: 30 suppository, Refills: 11    meclizine (ANTIVERT) 25 MG tablet Take 1 tablet (25 mg total) by mouth 3 (three) times daily  as needed for dizziness. Qty: 30 tablet, Refills: 0    Melatonin 10 MG CAPS Take 1 capsule by mouth at bedtime.    methylphenidate (RITALIN) 10 MG tablet Take up to 3 times a day po Qty: 90 tablet, Refills: 0    OVER THE COUNTER MEDICATION Inhale 1 application into the lungs at bedtime. C-PAP    pantoprazole (PROTONIX) 40 MG tablet TAKE 1 TABLET BY MOUTH 2 TIMES A DAY. Qty: 60 tablet, Refills: 3    polyethylene glycol powder (GLYCOLAX/MIRALAX) powder Take 1 Container by mouth 3 (three) times daily.    promethazine (PHENERGAN) 12.5 MG tablet Take 1-2 tablets (12.5-25 mg total) by mouth every 6 (six) hours as needed for nausea or  vomiting. Qty: 30 tablet, Refills: 0   Associated Diagnoses: Non-intractable vomiting with nausea, vomiting of unspecified type    rivaroxaban (XARELTO) 20 MG TABS tablet Take 1 tablet (20 mg total) by mouth daily with supper. Qty: 30 tablet, Refills: 11    rosuvastatin (CRESTOR) 40 MG tablet Take 1 tablet (40 mg total) by mouth daily. Qty: 90 tablet, Refills: 0    Sennosides (SENOKOT PO) Take 2 tablets by mouth daily.    Vitamin D, Ergocalciferol, (DRISDOL) 50000 UNITS CAPS capsule Take 50,000 Units by mouth every 7 (seven) days. Take on Mondays    nitroGLYCERIN (NITROSTAT) 0.4 MG SL tablet Place 0.4 mg under the tongue every 5 (five) minutes as needed. For chest pain      STOP taking these medications     aspirin 81 MG EC tablet      carvedilol (COREG) 3.125 MG tablet      furosemide (LASIX) 40 MG tablet      ranolazine (RANEXA) 500 MG 12 hr tablet         ALLERGIES:   Allergies  Allergen Reactions  . Diphenhydramine Hcl Anaphylaxis    BRIEF HPI:  See H&P, Labs, Consult and Test reports for all details in brief, patient was admitted for evaluation of persistent nausea and vomiting for the past one month. Patient also claimed to have lightheadedness mostly on standing up/sitting up going on ever since he had a CVA in November 2015.   CONSULTATIONS:   GI,   Phone consult -vascular surgery-Dr Kellie Simmering  Phone consult -neuro-Dr Aram Beecham  PERTINENT RADIOLOGIC STUDIES: Ct Angio Head W/cm &/or Wo Cm  11/15/2014   CLINICAL DATA:  64 year old male status post right MCA infarct in 2015. Dizziness, headache. Vertigo. Initial encounter.  EXAM: CT ANGIOGRAPHY HEAD AND NECK  TECHNIQUE: Multidetector CT imaging of the head and neck was performed using the standard protocol during bolus administration of intravenous contrast. Multiplanar CT image reconstructions and MIPs were obtained to evaluate the vascular anatomy. Carotid stenosis measurements (when applicable) are obtained utilizing  NASCET criteria, using the distal internal carotid diameter as the denominator.  CONTRAST:  52mL OMNIPAQUE IOHEXOL 350 MG/ML SOLN  COMPARISON:  Brain MRI 05/03/2014. Neck MRA 02/13/2014. Intracranial MRA 02/12/2014.  FINDINGS: CT HEAD  Brain: Right MCA territory encephalomalacia prominently involving the right temporal lobe and right corona radiata as before. Stable mild ex vacuo enlargement of the right lateral ventricle. Stable cerebral volume. No intracranial mass effect. No acute cortically based infarct identified. No acute intracranial hemorrhage identified.  Calvarium and skull base:  No acute osseous abnormality identified.  Paranasal sinuses: Chronic paranasal sinusitis with bilateral maxillary mucoperiosteal thickening. Tympanic cavities and mastoids are clear.  Orbits: Postoperative changes to the right globe. Negative orbit and scalp soft tissues otherwise.  CTA NECK  Skeleton: diffuse idiopathic skeletal hyperostosis bulky and flowing anterior endplate osteophytes in the cervical spine. Sequelae of median sternotomy. No acute osseous abnormality identified.  Other neck: Negative lung apices. No superior mediastinal lymphadenopathy.  Mild thyromegaly. 10 mm or smaller low-density thyroid nodules, do not meet size criteria for ultrasound evaluation.7 larynx, pharynx, parapharyngeal spaces, retropharyngeal space, sublingual space, submandibular glands, and parotid glands are within normal limits. Cervical lymph nodes are within normal limits, mildly larger on the left.  Aortic arch: 3 vessel arch configuration. No arch atherosclerosis or great vessel origin stenosis.  Right carotid system: Minimal soft plaque at the right carotid bifurcation with no stenosis.  Left carotid system: Mildly retropharyngeal course of the left carotid. Minimal soft plaque at the left carotid bifurcation with no stenosis.  Vertebral arteries:No proximal subclavian artery stenosis. Soft and calcified plaque at the right vertebral  artery origin with mild stenosis (coronal series 603, image 148). Otherwise negative cervical right vertebral artery. Normal left vertebral artery origin. Normal cervical left vertebral artery.  CTA HEAD  Posterior circulation: Normal distal right vertebral artery and vertebrobasilar junction. Dominant appearing right AICA.  Low-density filling defect in the left vertebral artery at the left PICA origin (series 603, image 141) with up to 50 % stenosis with respect to the distal vessel. The left PICA origin remains patent. This appearance is stable since 02/13/2014. Patent vertebrobasilar junction and left AICA.  No basilar artery stenosis. SCA and PCA origins are normal. Posterior communicating arteries are diminutive or absent. Bilateral PCA branches are within normal limits.  Anterior circulation: Tortuous distal cervical left ICA just below the skullbase. Up to moderate soft and calcified plaque of both ICA siphons, slightly greater on the left. Still, no hemodynamically significant siphon stenosis. Both ophthalmic artery origins are normal. Patent carotid termini.  Dominant right ACA A1 segment. Diminutive left A1. Normal anterior communicating artery. Normal ACA origins. Normal bilateral ACA branches. Left MCA origin, M1 segment, bifurcation, and left MCA branches are within normal limits.  Right MCA origin is patent. Right MCA M1 segment now appears patent as does the right MCA bifurcation. Right MCA branches are mildly attenuated compared to those on the left, but no major right MCA branch occlusion is identified.  Venous sinuses: Patent.  Anatomic variants: Dominant right ACA A1 segment.  Delayed phase: No abnormal enhancement identified.  IMPRESSION: 1. Chronic filling defect in the distal left vertebral artery compatible with soft plaque or thromboembolic disease. Subsequent moderate distal left vertebral artery stenosis appears stable since 02/13/2014. The left PICA origin remains patent. 2. Otherwise  negative posterior circulation. Mild right vertebral artery origin stenosis related the soft/calcified plaque. 3. Minor soft plaque at both carotid bifurcations without stenosis. Moderate bilateral ICA siphons soft and calcified plaque without hemodynamically significant stenosis. 4. Improved right MCA patency since November 2015. No intracranial arterial occlusion identified. 5. Chronic right MCA infarct.  No acute intracranial abnormality. 6.  Diffuse idiopathic skeletal hyperostosis.   Electronically Signed   By: Genevie Ann M.D.   On: 11/15/2014 19:39   Dg Abd 1 View  10/28/2014   CLINICAL DATA:  No bowel movement for the past 2 weeks, nausea and vomiting now  EXAM: ABDOMEN - 1 VIEW  COMPARISON:  None in PACs  FINDINGS: The colonic stool burden is increased diffusely. There is a moderate amount of gas within bowel in the mid abdomen, likely the sigmoid. There is no small bowel obstructive pattern. There are numerous pelvic phleboliths. No acute  bony abnormality is observed.  IMPRESSION: Increase colonic stool burden likely reflects clinical constipation. There is no evidence of obstruction or perforation.   Electronically Signed   By: David  Martinique M.D.   On: 10/28/2014 11:49   Ct Angio Neck W/cm &/or Wo/cm  11/15/2014   CLINICAL DATA:  64 year old male status post right MCA infarct in 2015. Dizziness, headache. Vertigo. Initial encounter.  EXAM: CT ANGIOGRAPHY HEAD AND NECK  TECHNIQUE: Multidetector CT imaging of the head and neck was performed using the standard protocol during bolus administration of intravenous contrast. Multiplanar CT image reconstructions and MIPs were obtained to evaluate the vascular anatomy. Carotid stenosis measurements (when applicable) are obtained utilizing NASCET criteria, using the distal internal carotid diameter as the denominator.  CONTRAST:  59mL OMNIPAQUE IOHEXOL 350 MG/ML SOLN  COMPARISON:  Brain MRI 05/03/2014. Neck MRA 02/13/2014. Intracranial MRA 02/12/2014.  FINDINGS:  CT HEAD  Brain: Right MCA territory encephalomalacia prominently involving the right temporal lobe and right corona radiata as before. Stable mild ex vacuo enlargement of the right lateral ventricle. Stable cerebral volume. No intracranial mass effect. No acute cortically based infarct identified. No acute intracranial hemorrhage identified.  Calvarium and skull base:  No acute osseous abnormality identified.  Paranasal sinuses: Chronic paranasal sinusitis with bilateral maxillary mucoperiosteal thickening. Tympanic cavities and mastoids are clear.  Orbits: Postoperative changes to the right globe. Negative orbit and scalp soft tissues otherwise.  CTA NECK  Skeleton: diffuse idiopathic skeletal hyperostosis bulky and flowing anterior endplate osteophytes in the cervical spine. Sequelae of median sternotomy. No acute osseous abnormality identified.  Other neck: Negative lung apices. No superior mediastinal lymphadenopathy.  Mild thyromegaly. 10 mm or smaller low-density thyroid nodules, do not meet size criteria for ultrasound evaluation.7 larynx, pharynx, parapharyngeal spaces, retropharyngeal space, sublingual space, submandibular glands, and parotid glands are within normal limits. Cervical lymph nodes are within normal limits, mildly larger on the left.  Aortic arch: 3 vessel arch configuration. No arch atherosclerosis or great vessel origin stenosis.  Right carotid system: Minimal soft plaque at the right carotid bifurcation with no stenosis.  Left carotid system: Mildly retropharyngeal course of the left carotid. Minimal soft plaque at the left carotid bifurcation with no stenosis.  Vertebral arteries:No proximal subclavian artery stenosis. Soft and calcified plaque at the right vertebral artery origin with mild stenosis (coronal series 603, image 148). Otherwise negative cervical right vertebral artery. Normal left vertebral artery origin. Normal cervical left vertebral artery.  CTA HEAD  Posterior  circulation: Normal distal right vertebral artery and vertebrobasilar junction. Dominant appearing right AICA.  Low-density filling defect in the left vertebral artery at the left PICA origin (series 603, image 141) with up to 50 % stenosis with respect to the distal vessel. The left PICA origin remains patent. This appearance is stable since 02/13/2014. Patent vertebrobasilar junction and left AICA.  No basilar artery stenosis. SCA and PCA origins are normal. Posterior communicating arteries are diminutive or absent. Bilateral PCA branches are within normal limits.  Anterior circulation: Tortuous distal cervical left ICA just below the skullbase. Up to moderate soft and calcified plaque of both ICA siphons, slightly greater on the left. Still, no hemodynamically significant siphon stenosis. Both ophthalmic artery origins are normal. Patent carotid termini.  Dominant right ACA A1 segment. Diminutive left A1. Normal anterior communicating artery. Normal ACA origins. Normal bilateral ACA branches. Left MCA origin, M1 segment, bifurcation, and left MCA branches are within normal limits.  Right MCA origin is patent. Right MCA M1 segment  now appears patent as does the right MCA bifurcation. Right MCA branches are mildly attenuated compared to those on the left, but no major right MCA branch occlusion is identified.  Venous sinuses: Patent.  Anatomic variants: Dominant right ACA A1 segment.  Delayed phase: No abnormal enhancement identified.  IMPRESSION: 1. Chronic filling defect in the distal left vertebral artery compatible with soft plaque or thromboembolic disease. Subsequent moderate distal left vertebral artery stenosis appears stable since 02/13/2014. The left PICA origin remains patent. 2. Otherwise negative posterior circulation. Mild right vertebral artery origin stenosis related the soft/calcified plaque. 3. Minor soft plaque at both carotid bifurcations without stenosis. Moderate bilateral ICA siphons soft and  calcified plaque without hemodynamically significant stenosis. 4. Improved right MCA patency since November 2015. No intracranial arterial occlusion identified. 5. Chronic right MCA infarct.  No acute intracranial abnormality. 6.  Diffuse idiopathic skeletal hyperostosis.   Electronically Signed   By: Genevie Ann M.D.   On: 11/15/2014 19:39   Dg Abd Acute W/chest  11/11/2014   CLINICAL DATA:  Patient reports vomiting when trying to stand, duration of symptoms for the past month, history of diverticulosis, tubular adenoma of the colon, and diabetes, also history of coronary artery disease and previous MI and CABG.  EXAM: DG ABDOMEN ACUTE W/ 1V CHEST  COMPARISON:  KUB of October 28, 2014 and PA and lateral chest x-ray of June 21, 2014.  FINDINGS: Chest x-ray: The lungs are adequately inflated. There is stable density overlying the left cardiac apex likely reflecting atelectasis or scarring. The heart is top-normal in size. The pulmonary vascularity is normal. There are 6 sternal wires. The next the lowest wire is fractured. This is stable. There is an implantable cardiac rhythm recording device present on the left.  Within the abdomen the gas and stool pattern is unremarkable. There are no abnormal soft tissue calcifications. There are phleboliths in the pelvis. The bony structures are unremarkable.  IMPRESSION: There is no active cardiopulmonary disease. There is no acute intra abdominal abnormality.   Electronically Signed   By: David  Martinique M.D.   On: 11/11/2014 14:24     PERTINENT LAB RESULTS: CBC:  Recent Labs  11/14/14 0401  WBC 5.8  HGB 13.3  HCT 38.5*  PLT 114*   CMET CMP     Component Value Date/Time   NA 137 11/14/2014 0401   K 3.8 11/14/2014 0401   CL 106 11/14/2014 0401   CO2 25 11/14/2014 0401   GLUCOSE 86 11/14/2014 0401   BUN 6 11/14/2014 0401   CREATININE 1.12 11/14/2014 0401   CREATININE 0.83 09/13/2014 1032   CALCIUM 8.4* 11/14/2014 0401   PROT 7.6 11/11/2014 1339    ALBUMIN 3.5 11/11/2014 1339   AST 21 11/11/2014 1339   ALT 14* 11/11/2014 1339   ALKPHOS 73 11/11/2014 1339   BILITOT 1.4* 11/11/2014 1339   GFRNONAA >60 11/14/2014 0401   GFRNONAA >89 09/13/2014 1032   GFRAA >60 11/14/2014 0401   GFRAA >89 09/13/2014 1032    GFR Estimated Creatinine Clearance: 82.7 mL/min (by C-G formula based on Cr of 1.12). No results for input(s): LIPASE, AMYLASE in the last 72 hours. No results for input(s): CKTOTAL, CKMB, CKMBINDEX, TROPONINI in the last 72 hours. Invalid input(s): POCBNP No results for input(s): DDIMER in the last 72 hours. No results for input(s): HGBA1C in the last 72 hours. No results for input(s): CHOL, HDL, LDLCALC, TRIG, CHOLHDL, LDLDIRECT in the last 72 hours. No results for input(s): TSH, T4TOTAL,  T3FREE, THYROIDAB in the last 72 hours.  Invalid input(s): FREET3 No results for input(s): VITAMINB12, FOLATE, FERRITIN, TIBC, IRON, RETICCTPCT in the last 72 hours. Coags: No results for input(s): INR in the last 72 hours.  Invalid input(s): PT Microbiology: No results found for this or any previous visit (from the past 240 hour(s)).   BRIEF HOSPITAL COURSE:  Nausea/vomiting: Ongoing for 1 month, but none since hospitalization. Reviewed prior EGD done in 2015, patient had a gastric ulcer-recommendations were to repeat EGD.Consulted GI-EGD 8/29-showed no acute abnormalities. GI suspects central etiology-and recommends Zofran twice a day. Subsequently, case was discussed with neurology-Dr. Aram Beecham over the phone, recommendations were for CTA head/neck, which was completed-findings (moderate stenosis of left vertebral artery) were then discussed with neurology who recommended vascular surgery consult and had no further recommendations. Subsequently spoke with Dr. Estil Daft surgery over the phone, he felt that's since the right vertebral artery and proximal subclavian artery was patent, this was not the cause for patient's persistent   dizziness. Have asked patient to follow-up with his primary neurologist at [facility] neurology for further suggestions.   Active Problems: Hypokalemia: Secondary to GI loss, repleted. Please check lytes periodically as outpatient  Acute renal failure: Likely secondary to prerenal azotemia, resolved with IVF.Please check lytes periodically as outpatient  Lightheadedness/Dizziness:mostly on standing. This has been going on since his CVA this past November. Orthostatics negative.Per PT negative for vestibular maneuvers.Please see above-but in short spoke with neurology over the phone-who reviewed chart and recommended a CTA of the head and neck, CTA showed moderate stenosis of the left vertebral artery. This was then discussed with neurology again over the phone, who did not have any further recommendations apart from discussing case with vascular surgery. Case was then discussed with Dr. Kellie Simmering from vascular surgery-who felt that this would not explain patient's symptoms as the right vertebral artery was patent, and also proximal subclavian artery was patent. He had no further suggestion, and did not feel that patient would benefit from outpatient VVS follow-up as well. Since this is a chronic issue and ongoing for close to a year, have instructed patient to follow-up with his primary neurologist. This was explained to both patient and his wife at bedside  History of CAD: Without chest pain or shortness of breath. Will discontinue aspirin as on Xarelto (prior EGD-+ulcer), continue statin, resume Coreg when BP more stable.  History of pulmonary embolism: In anticipation of EGD Xarelto was stopped-patient was briefly placed on an heparin infusion. Resumed Xarelto when EGD completed. Reviewed pulmonary consultation in January 2016-recommendations were for lifelong anticoagulation.  History of CVA with residual left-sided hemiplegia: Was on ASA- discontinued-as on anticoagulation (spoke with primary cards-Dr  Claiborne Billings over the phone 8/29-agrees).Continue statin.  Dyslipidemia: Continue statin  History of hypertension: Although BP improved-still soft-continue to hold Coreg, Ranexa and Lasix on discharge. Does get lightheaded/foggy (chronic issue since Nov 2015)-but no other symptoms  Chronic systolic and diastolic heart failure: Last Echo-EF-40% to 45%.Clinically compensated. Resume beta blocker and lasix when able-BP currently soft  Protein-calorie malnutrition, severe: Continue supplements  TODAY-DAY OF DISCHARGE:  Subjective:   [name] today has no headache,no chest abdominal pain,no new weakness tingling or numbness, feels much better wants to go home today. HE DOES NOT WANT TO GO TO SNF  Objective:   Blood pressure 84/60, pulse 75, temperature 97.7 F (36.5 C), temperature source Oral, resp. rate 15, height 6' (1.829 m), weight 103 kg (227 lb 1.2 oz), SpO2 98 %.  Intake/Output Summary (  Last 24 hours) at 11/16/14 1101 Last data filed at 11/16/14 9323  Gross per 24 hour  Intake    360 ml  Output   1175 ml  Net   -815 ml   Filed Weights   11/11/14 1638 11/13/14 0529 11/15/14 0300  Weight: 99.837 kg (220 lb 1.6 oz) 102.83 kg (226 lb 11.2 oz) 103 kg (227 lb 1.2 oz)    Exam Awake Alert, Oriented *3, No new F.N deficits, Normal affect [place].AT,PERRAL Supple Neck,No JVD, No cervical lymphadenopathy appriciated.  Symmetrical Chest wall movement, Good air movement bilaterally, CTAB RRR,No Gallops,Rubs or new Murmurs, No Parasternal Heave +ve B.Sounds, Abd Soft, Non tender, No organomegaly appriciated, No rebound -guarding or rigidity. No Cyanosis, Clubbing or edema, No new Rash or bruise  DISCHARGE CONDITION: Stable  DISPOSITION: Home with home health services  DISCHARGE INSTRUCTIONS:    Activity:  As tolerated with Full fall precautions use walker/cane & assistance as needed  Get Medicines reviewed and adjusted: Please take all your medications with you for your next visit  with your Primary MD  Please request your Primary MD to go over all hospital tests and procedure/radiological results at the follow up, please ask your Primary MD to get all Hospital records sent to his/her office.  If you experience worsening of your admission symptoms, develop shortness of breath, life threatening emergency, suicidal or homicidal thoughts you must seek medical attention immediately by calling 911 or calling your MD immediately  if symptoms less severe.  You must read complete instructions/literature along with all the possible adverse reactions/side effects for all the Medicines you take and that have been prescribed to you. Take any new Medicines after you have completely understood and accpet all the possible adverse reactions/side effects.   Do not drive when taking Pain medications.   Do not take more than prescribed Pain, Sleep and Anxiety Medications  Special Instructions: If you have smoked or chewed Tobacco  in the last 2 yrs please stop smoking, stop any regular Alcohol  and or any Recreational drug use.  Wear Seat belts while driving.  Please note  You were cared for by a hospitalist during your hospital stay. Once you are discharged, your primary care physician will handle any further medical issues. Please note that NO REFILLS for any discharge medications will be authorized once you are discharged, as it is imperative that you return to your primary care physician (or establish a relationship with a primary care physician if you do not have one) for your aftercare needs so that they can reassess your need for medications and monitor your lab values.   Diet recommendation: Heart Healthy diet  Discharge Instructions    Call MD for:  persistant dizziness or light-headedness    Complete by:  As directed      Call MD for:  persistant nausea and vomiting    Complete by:  As directed      Diet - low sodium heart healthy    Complete by:  As directed       Increase activity slowly    Complete by:  As directed            Follow-up Information    Follow up with [name], MD On 11/14/2014.   Specialty:  Family Medicine   Why:  Friday 11/18/14 @ 12:15pm   Contact information:   [place] [address] [phone]       Follow up with [facility].  Why:  hhpt   Contact information:   [address] [address] [phone]       Follow up with SATER,RICHARD A, MD. Schedule an appointment as soon as possible for a visit in 2 weeks.   Specialty:  Neurology   Contact information:   [place]  [address] [phone]      Total Time spent on discharge equals 45 minutes.  SignedOren Binet 11/16/2014 11:01 AM

## 2014-11-17 ENCOUNTER — Telehealth: Payer: Self-pay | Admitting: *Deleted

## 2014-11-17 ENCOUNTER — Telehealth: Payer: Self-pay | Admitting: Gastroenterology

## 2014-11-17 ENCOUNTER — Encounter: Payer: Self-pay | Admitting: Cardiology

## 2014-11-17 NOTE — Telephone Encounter (Signed)
Transition Care Management Follow-up Telephone Call  Date discharged? 11/16/14    How have you been since you were released from the hospital? Patient is doing fairly well.   Do you understand why you were in the hospital? yes   Do you understand the discharge instructions? yes   Where were you discharged to? Home   Items Reviewed:  Medications reviewed: yes  Allergies reviewed: yes  Dietary changes reviewed: no  Referrals reviewed: no   Functional Questionnaire:   Activities of Daily Living (ADLs):   He states they are independent in the following: ambulation, bathing and hygiene, feeding, continence, grooming, toileting and dressing States they require assistance with the following: At baseline   Any transportation issues/concerns?: no   Any patient concerns? no   Confirmed importance and date/time of follow-up visits scheduled yes, 11/18/14 @ 1215  Provider Appointment booked with Dr. Damita Dunnings  Confirmed with patient if condition begins to worsen call PCP or go to the ER.  Patient was given the office number and encouraged to call back with question or concerns.  : yes

## 2014-11-17 NOTE — Telephone Encounter (Signed)
Pt has been notified of labs see alternate note

## 2014-11-18 ENCOUNTER — Ambulatory Visit (INDEPENDENT_AMBULATORY_CARE_PROVIDER_SITE_OTHER): Payer: Medicare Other | Admitting: Family Medicine

## 2014-11-18 ENCOUNTER — Encounter: Payer: Self-pay | Admitting: Family Medicine

## 2014-11-18 ENCOUNTER — Ambulatory Visit: Payer: 59 | Admitting: Family Medicine

## 2014-11-18 VITALS — BP 100/60 | HR 103 | Temp 97.8°F | Wt 226.0 lb

## 2014-11-18 DIAGNOSIS — R112 Nausea with vomiting, unspecified: Secondary | ICD-10-CM | POA: Diagnosis not present

## 2014-11-18 DIAGNOSIS — R1110 Vomiting, unspecified: Secondary | ICD-10-CM

## 2014-11-18 NOTE — Progress Notes (Signed)
Pre visit review using our clinic review tool, if applicable. No additional management support is needed unless otherwise documented below in the visit note.  Hospital f/u.   Was lightheaded.  Had CT done, no cause seen on imaging.  Likely from prev CVA, dehydration, and BP meds.  D/w pt re: hospital course and results, ie CT.    No vomiting in the meantime.  On zofran now.  He has had nausea since the stoke but not everyday usually.  Texture of foods/mouth-feel of foods has been altered since the CVA.     Per patient:  He states he had improved L arm and leg movement when treated with heparin for DVT.  His voice was improved during the hospitalization while on heparin.   I have no evidence that heparin tx would improve his CVA deficit at this point . D/w pt.  He can discuss with neuro.    PMH and SH reviewed  ROS: See HPI, otherwise noncontributory.  Meds, vitals, and allergies reviewed.   GEN: nad, alert and oriented, in WC HEENT: mucous membranes moist NECK: supple w/o LA CV: rrr.  PULM: ctab, no inc wob ABD: soft, +bs EXT: no edema No movement L arm and leg, baseline

## 2014-11-18 NOTE — Patient Instructions (Addendum)
Call about moving your neurology appointment sooner.   Keep the appointment with Dr. Claiborne Billings.   Take zofran twice a day for a few days, then try to cut back to once a day.   I would make a list of tolerable foods in the meantime.   You can try protein powder in the meantime if you can't tolerate boost or ensure.   We can schedule a welcome to medicare visit when convenient.  Take care.  Glad to see you.

## 2014-11-20 ENCOUNTER — Encounter: Payer: Self-pay | Admitting: Family Medicine

## 2014-11-20 NOTE — Assessment & Plan Note (Signed)
Likely multifactorial, with central component from the CVA.  Discussed with patient, long discussion- at this point, I don't expect sig improvement in his condition with available treatment.   Will likely need to continue zofran, but can try to taper down to qd dosing, or just qd prn.  Keep f/u with neuro and cards.   No change in meds now.  He can try to use protein powder to supplement his diet.   See AVS.  >35 minutes spent in face to face time with patient, >50% spent in counselling or coordination of care.

## 2014-11-25 ENCOUNTER — Encounter: Payer: Self-pay | Admitting: Cardiology

## 2014-11-28 ENCOUNTER — Telehealth: Payer: Self-pay

## 2014-11-28 NOTE — Telephone Encounter (Signed)
Ok to do. Thanks.  

## 2014-11-28 NOTE — Telephone Encounter (Signed)
Ross PT with [facility] left v/m requesting verbal orders for home health PT 3 x a week for 3 weeks and 2 x a week for 2 weeks.

## 2014-11-29 NOTE — Telephone Encounter (Signed)
Message left advising Gregory Wiley (not Harrington Challenger).

## 2014-11-30 ENCOUNTER — Encounter: Payer: Self-pay | Admitting: Neurology

## 2014-11-30 ENCOUNTER — Ambulatory Visit (INDEPENDENT_AMBULATORY_CARE_PROVIDER_SITE_OTHER): Payer: Medicare Other | Admitting: Neurology

## 2014-11-30 VITALS — BP 106/78 | HR 96 | Resp 18 | Wt 226.0 lb

## 2014-11-30 DIAGNOSIS — G4733 Obstructive sleep apnea (adult) (pediatric): Secondary | ICD-10-CM

## 2014-11-30 DIAGNOSIS — M5481 Occipital neuralgia: Secondary | ICD-10-CM | POA: Insufficient documentation

## 2014-11-30 DIAGNOSIS — I63411 Cerebral infarction due to embolism of right middle cerebral artery: Secondary | ICD-10-CM | POA: Diagnosis not present

## 2014-11-30 DIAGNOSIS — G4489 Other headache syndrome: Secondary | ICD-10-CM

## 2014-11-30 DIAGNOSIS — G8192 Hemiplegia, unspecified affecting left dominant side: Secondary | ICD-10-CM | POA: Diagnosis not present

## 2014-11-30 DIAGNOSIS — I5042 Chronic combined systolic (congestive) and diastolic (congestive) heart failure: Secondary | ICD-10-CM | POA: Diagnosis not present

## 2014-11-30 MED ORDER — IMIPRAMINE HCL 25 MG PO TABS: 25.0000 mg | ORAL_TABLET | Freq: Every day | ORAL | Status: DC

## 2014-11-30 NOTE — Progress Notes (Signed)
[facility]  PATIENT: Gregory Wiley DOB: 1950/05/28  REFERRING CLINICIAN: james Little HISTORY FROM: patient REASON FOR VISIT: stroke   HISTORICAL  CHIEF COMPLAINT:  Chief Complaint  Patient presents with  . History of Stroke    He was hospitalized at [facility] 11-11-14 thru 11-16-14 for n/v, dehydration.  Sts. for the last 2-3 weeks, prior to hospitalization, he began having h/a that is different from his normal h/a's.  Sts. he is fine when lying down, h/a starts about 30 sec. after sitting up.  He denies dizziness, unless he takes Gabapentin.  Sts. peripheral vision is decreased.  Sts. depression is worse. Sts. he stopped taking Tizanidine because it causes next day drowsiness.  Sts. spasticity no worse since stopping it.    . Left Sided Hemiplegia    Sts. he still has left shoulder pain--doesn't think inj. given at last ov helped.  Sts. he is inconsistent with CPAP use for OSA/fim  . 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     He had a lot of N/V a couple weeks ago and he had dehydration.  At his doctor's office, he had blood in his vomitus and he was sent to the ER.    He got fluid and needed K+ supplementation.   MRA showed some vertebral stenosis (L).     BP med's were d/c.   He continues on Xarelto .  He has had chronic daily left sided headache x 4  weeks that intensifies when he sits up.   He also feels lightheaded.   Of note, the headache was present even after he received a lot of fluid in the hospital.    He feels much better laying down.      Stroke History:  On 02/12/2014, he woke up with weakness on the left side and was unable to stand up. He went to the emergency room.Marland Kitchen MRI of the brain showed an acute right MCA territory stroke that mostly involved the right temporal lobe with extension to the insula and the basal ganglia on the right. MR angiogram showed an occlusion of the  right M1 segment  Of note, he had been on aspirin and Plavix that had been discontinued a few weeks earlier due to a bleeding ulcer.   Ultrasound of the carotid arteries showed less than 39% stenosis. A transesophageal echocardiogram showed a LVEF equals 45%, there was septal hypokinesia, no PFO or ASD was identified.  Cardioembolism was suspected.  He was initially placed on Plavix but that was subsequently changed to Xarelto after he was found to have pulmonary embolisms while at rehabilitation center.  Gait/Strength/sensation:    Currently, he has weakness on the left side. He uses a quad cane but has not walked much recently due to the headache. His leg is still extremely weak and he cannot raise the knee any but can straighten the lower leg out mildly (unchanged).    He can move left shoulder backwards to his arm.  He also has some numbness on the left side with some pain. He had some neglect and numbness initially that improved.  Swallow:   His dysphagia improved and and he can eat anything but has lost his appetite.  He has lost > 100 pounds since the stroke.    Bladder:  He has not had bladder issues from the stroke.  He notes some urinary hesitancy but has  not had incontinence.  OSA/sleep/sleepiness:    He has obstructive sleep apneabut takes mask off in middle of night.   He snores still despite weight loss.    He has excessive daytime sleepiness and is on methylphenidate 10 mg by mouth twice a day with mild much benefit (still dozing off  --- but not using CPAP much)  Nutrition:   He has been eating less since the HA started a month ago.   .  Left shoulder pain:   He reports left shoulder pain for the past few months but it was worse earlier in the year.  .Pain increases when his arm is moved or when he lays on his left side. In the past, he has had bursitis and injections have helped reduce the pain a fair amount.  He is on Xarelto  Cognitive:   He feels his cognition has returned to  baseline but wife feels that he tends to have poor attention and decrease executive function abilities.  Neglect has improved.    REVIEW OF SYSTEMS:  Constitutional: No fevers, chills, sweats, or change in appetite.  He sleeps poorly.    He is very tired Eyes: No visual changes, double vision, eye pain Ear, nose and throat: No hearing loss, ear pain, nasal congestion, sore throat Cardiovascular: No chest pain, palpitations Respiratory:  No shortness of breath at rest or with exertion.   No wheezes GastrointestinaI: No nausea, vomiting, diarrhea, abdominal pain, fecal incontinence Genitourinary:  No dysuria, urinary retention or frequency.  No nocturia. Musculoskeletal:  No neck pain, back pain but he has shoulder and hip pain Integumentary: No rash, pruritus, skin lesions Neurological: as above Psychiatric: No depression at this time.  No anxiety Endocrine: No palpitations, diaphoresis, change in appetite, change in weigh.  He notes feeling cold and increased thirst Hematologic/Lymphatic:  No anemia, purpura, petechiae. Allergic/Immunologic: No itchy/runny eyes, nasal congestion, recent allergic reactions, rashes  ALLERGIES: Allergies  Allergen Reactions  . Diphenhydramine Hcl Anaphylaxis    HOME MEDICATIONS: Outpatient Prescriptions Prior to Visit  Medication Sig Dispense Refill  . acetaminophen (TYLENOL) 325 MG tablet Take 2 tablets (650 mg total) by mouth every 6 (six) hours as needed for mild pain.    Marland Kitchen colchicine 0.6 MG tablet Take 0.6 mg by mouth as needed (for gout).   0  . fluticasone (FLONASE) 50 MCG/ACT nasal spray Place 2 sprays into both nostrils daily.  2  . hydrocortisone (ANUSOL-HC) 25 MG suppository Place 1 suppository (25 mg total) rectally daily. X 1 week; then, use as needed thereafter. 30 suppository 11  . meclizine (ANTIVERT) 25 MG tablet Take 1 tablet (25 mg total) by mouth 3 (three) times daily as needed for dizziness. (Patient taking differently: Take 25 mg by  mouth 3 (three) times daily as needed for dizziness (bed-time). ) 30 tablet 0  . Melatonin 10 MG CAPS Take 1 capsule by mouth at bedtime.    . methylphenidate (RITALIN) 10 MG tablet Take up to 3 times a day po 90 tablet 0  . nitroGLYCERIN (NITROSTAT) 0.4 MG SL tablet Place 0.4 mg under the tongue every 5 (five) minutes as needed. For chest pain    . ondansetron (ZOFRAN) 4 MG tablet Take 1 tablet (4 mg total) by mouth every 12 (twelve) hours. 60 tablet 0  . OVER THE COUNTER MEDICATION Inhale 1 application into the lungs at bedtime. C-PAP    . pantoprazole (PROTONIX) 40 MG tablet TAKE 1 TABLET BY MOUTH 2 TIMES A DAY. [place]  tablet 3  . polyethylene glycol powder (GLYCOLAX/MIRALAX) powder Take 1 Container by mouth 3 (three) times daily.    . rivaroxaban (XARELTO) 20 MG TABS tablet Take 1 tablet (20 mg total) by mouth daily with supper. 30 tablet 11  . rosuvastatin (CRESTOR) 40 MG tablet Take 1 tablet (40 mg total) by mouth daily. 90 tablet 0  . Sennosides (SENOKOT PO) Take 2 tablets by mouth daily.    . Vitamin D, Ergocalciferol, (DRISDOL) 50000 UNITS CAPS capsule Take 50,000 Units by mouth every 7 (seven) days. Take on Mondays    . ALPRAZolam (XANAX) 0.25 MG tablet Take 0.5 tablets (0.125 mg total) by mouth every 8 (eight) hours as needed for anxiety. (Patient not taking: Reported on 11/30/2014) 45 tablet 5  . feeding supplement, ENSURE ENLIVE, (ENSURE ENLIVE) LIQD Take 237 mLs by mouth 2 (two) times daily between meals. (Patient not taking: Reported on 11/30/2014) 60 Bottle 0  . promethazine (PHENERGAN) 12.5 MG tablet Take 1-2 tablets (12.5-25 mg total) by mouth every 6 (six) hours as needed for nausea or vomiting. (Patient not taking: Reported on 11/30/2014) 30 tablet 0   No facility-administered medications prior to visit.    PAST MEDICAL HISTORY: Past Medical History  Diagnosis Date  . Coronary artery disease     stent, then CABG 07/20/10  . Hypertension   . Hyperlipidemia   . Obesity (BMI  30-39.9)   . H/O cardiomyopathy     ischemic, now last echo 07/30/11, EF 38%W  . Diverticulosis   . Internal hemorrhoids   . Tubular adenoma of colon   . Plantar fasciitis of left foot   . Basal cell carcinoma of skin of left ankle   . NSTEMI (non-ST elevated myocardial infarction)     NSTEMI- last cath 06/2011-Stent to LCX-DES, last nuc 07/30/11 low risk  . MI (myocardial infarction)     "I had 4 before I retired in 2006" (11/11/2014)  . Pulmonary embolism 03/2014  . OSA on CPAP     since July 2013  . GERD (gastroesophageal reflux disease)   . Bleeding stomach ulcer 2015  . Daily headache     "for the past month" (11/11/2014)  . Stroke 01/2014    "no control of LUE, can slightly LLE; memory problems since" (11/11/2014)  . Arthritis     "back" (11/11/2014)  . Gout   . Depression     PAST SURGICAL HISTORY: Past Surgical History  Procedure Laterality Date  . Coronary artery bypass graft  07/20/2010     LIMA-LAD; VG-ACUTE MARG of RCA; Seq VG-distal RCA & then pda  . Coronary angioplasty with stent placement  07/01/2011    DES-Resolute to native LCX  . Coronary angioplasty with stent placement  12/01/2007    COMPLEX 5 LESION PCI INCLUDING CUTTING BALLOON AND 4 CYPHER DESs  . Knee arthroscopy Right   . Hand surgery Right     to take glass out  . Colonoscopy N/A 02/10/2013    Procedure: COLONOSCOPY;  Surgeon: Ladene Artist, MD;  Location: [facility] ENDOSCOPY;  Service: Endoscopy;  Laterality: N/A;  . Retinal detachment surgery Right   . Loop recorder implant  02/15/2014    MDT LINQ implanted by Dr Rayann Heman for cryptogenic stroke  . Tee without cardioversion N/A 02/15/2014    Procedure: TRANSESOPHAGEAL ECHOCARDIOGRAM (TEE);  Surgeon: Josue Hector, MD;  Location: [facility] ENDOSCOPY;  Service: Cardiovascular;  Laterality: N/A;  . Left heart catheterization with coronary/graft angiogram  07/01/2011    Procedure: LEFT  HEART CATHETERIZATION WITH Beatrix Fetters;  Surgeon: Sanda Klein, MD;   Location: [facility] CATH LAB;  Service: Cardiovascular;;  . Percutaneous coronary stent intervention (pci-s) Right 07/01/2011    Procedure: PERCUTANEOUS CORONARY STENT INTERVENTION (PCI-S);  Surgeon: Sanda Klein, MD;  Location: [facility] CATH LAB;  Service: Cardiovascular;  Laterality: Right;  . Umbilical hernia repair  2006  . Hernia repair    . Basal cell carcinoma excision Left 2015    ankle  . Esophagogastroduodenoscopy  01/2014    gastric ulcer, erosive gastroduodenitis, H Pylori negative.   . Esophagogastroduodenoscopy (egd) with propofol N/A 11/14/2014    Procedure: ESOPHAGOGASTRODUODENOSCOPY (EGD) WITH PROPOFOL;  Surgeon: Milus Banister, MD;  Location: [facility];  Service: Endoscopy;  Laterality: N/A;    FAMILY HISTORY: Family History  Problem Relation Age of Onset  . Diabetes type II Mother   . Coronary artery disease    . Colon cancer Neg Hx   . CAD Father 40    CABG, PCI, died at 70  . Heart attack Father 68  . Hypertension Brother   . Diabetes Brother   . Hyperlipidemia Brother   . Brain cancer Maternal Grandmother   . COPD Paternal Grandfather   . Other      denies family history of DVT/PE  . Stroke Neg Hx     SOCIAL HISTORY:  Social History   Social History  . Marital Status: Married    Spouse Name: N/A  . Number of Children: 5  . Years of Education: N/A   Occupational History  .      works at the [employer] Topics  . Smoking status: Former Smoker -- 2.00 packs/day for 4 years    Types: Cigarettes  . Smokeless tobacco: Never Used     Comment: "quit smoking cigarettes  in the 1970's"  . Alcohol Use: 0.0 oz/week    0 Standard drinks or equivalent per week     Comment: 11/11/2014 "1-2 times per year"  . Drug Use: Yes    Special: Marijuana     Comment: "smoked some pot in college"  . Sexual Activity: Not Currently   Other Topics Concern  . Not on file   Social History Narrative   Quit smoking 40 years ago   Married    Likes to  play guitar and sing with church group unable since 02/12/14 stroke      PHYSICAL EXAM  Filed Vitals:   11/30/14 0939  BP: 106/78  Pulse: 96  Resp: 18  Weight: 226 lb (102.513 kg)    Body mass index is 30.64 kg/(m^2).   General: The patient is well-developed and well-nourished and in no acute distress.  He is in a wheelchair.  Eyes:  Funduscopic exam shows normal optic discs and retinal vessels.  Neck: The neck is supple, no carotid bruits are noted.  The neck is tender at the    Skin: He has mild edema at the left ankle, compared to the right.    Neurologic Exam  Mental status: The patient is alert and oriented x 3 at the time of the examination. The patient has apparent normal recent and remote memory. Attention is mildly reduced.   Speech is normal.  Cranial nerves: Extraocular movements are full. Saccadic pursuit is reduced to the left. Pupils are equal, round, and reactive to light and accomodation.     He no longer has a facial droop. There is good facial sensation to soft touch bilaterally.  Trapezius and sternocleidomastoid strength is reduced on the left.. No dysarthria is noted.  No obvious hearing deficits are noted.  Motor:  Muscle bulk is normal. He has increased tone in the left arm and leg.  Arm is held in a flexed position. This mostly involved the flexor muscles of the arm and flexors and extensors of the leg. Strength is 5/5 on the right. Strength is 2/5 in left shoulder elevation and 1/5 in proximal arm strength. Strength is 2/5 in the quadriceps and 1/5 in other proximal muscles of the left leg  Sensory: Sensory testing is intact to pinprick, soft touch, vibration sensation, and position sense on all 4 extremities. He did not have neglect or extinction at this time.  Coordination: Cerebellar testing reveals good finger-nose-finger and heel-to-shin on the right and he cannot do these tasks on the left..  Gait and station: Station requires support. He is only  able to take a few steps with support.   Reflexes: Deep tendon reflexes are increased on the left.Marland Kitchen      DIAGNOSTIC DATA (LABS, IMAGING, TESTING) - I reviewed patient records, labs, notes, testing and imaging myself where available.  Lab Results  Component Value Date   WBC 5.8 11/14/2014   HGB 13.3 11/14/2014   HCT 38.5* 11/14/2014   MCV 84.2 11/14/2014   PLT 114* 11/14/2014      Component Value Date/Time   NA 137 11/14/2014 0401   K 3.8 11/14/2014 0401   CL 106 11/14/2014 0401   CO2 25 11/14/2014 0401   GLUCOSE 86 11/14/2014 0401   BUN 6 11/14/2014 0401   CREATININE 1.12 11/14/2014 0401   CREATININE 0.83 09/13/2014 1032   CALCIUM 8.4* 11/14/2014 0401   PROT 7.6 11/11/2014 1339   ALBUMIN 3.5 11/11/2014 1339   AST 21 11/11/2014 1339   ALT 14* 11/11/2014 1339   ALKPHOS 73 11/11/2014 1339   BILITOT 1.4* 11/11/2014 1339   GFRNONAA >60 11/14/2014 0401   GFRNONAA >89 09/13/2014 1032   GFRAA >60 11/14/2014 0401   GFRAA >89 09/13/2014 1032   Lab Results  Component Value Date   CHOL 113 07/22/2014   HDL 38* 07/22/2014   LDLCALC 54 07/22/2014   TRIG 104 07/22/2014   CHOLHDL 3.0 07/22/2014   Lab Results  Component Value Date   HGBA1C 5.6 02/13/2014   No results found for: VITAMINB12 Lab Results  Component Value Date   TSH 0.746 11/11/2014        ASSESSMENT AND PLAN  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   Left occipital nerve block with 80 mg Depo-Medrol in 3 mL Marcaine. He tolerated the procedure well and pain was slightly better afterwards. 2.  Imipramine at bedtime for headache. 3. .  He is advised to use his CPAP on a regular basis for his obstructive sleep apnea. 4.  If depression worsens, consider SSRI.  He will return to  see me in 6 weeks or sooner if he has new or worsening neurologic symptoms.    Davarion Cuffee A. Felecia Shelling, MD, PhD 2/50/5397, 6:73 AM Certified in Neurology, Clinical Neurophysiology, Sleep Medicine, Pain Medicine and Neuroimaging  [facility] [address], [place], [place] [address] [phone]

## 2014-12-01 ENCOUNTER — Encounter: Payer: Self-pay | Admitting: Cardiology

## 2014-12-02 ENCOUNTER — Telehealth: Payer: Self-pay | Admitting: Cardiovascular Disease

## 2014-12-02 ENCOUNTER — Ambulatory Visit (INDEPENDENT_AMBULATORY_CARE_PROVIDER_SITE_OTHER): Payer: Medicare Other | Admitting: Cardiovascular Disease

## 2014-12-02 VITALS — BP 102/80 | HR 118 | Ht 72.0 in | Wt 211.0 lb

## 2014-12-02 DIAGNOSIS — E43 Unspecified severe protein-calorie malnutrition: Secondary | ICD-10-CM | POA: Diagnosis not present

## 2014-12-02 DIAGNOSIS — I25810 Atherosclerosis of coronary artery bypass graft(s) without angina pectoris: Secondary | ICD-10-CM | POA: Diagnosis not present

## 2014-12-02 DIAGNOSIS — R000 Tachycardia, unspecified: Secondary | ICD-10-CM

## 2014-12-02 DIAGNOSIS — G4489 Other headache syndrome: Secondary | ICD-10-CM

## 2014-12-02 MED ORDER — CARVEDILOL 6.25 MG PO TABS: 3.1250 mg | ORAL_TABLET | Freq: Two times a day (BID) | ORAL | Status: DC

## 2014-12-02 NOTE — Telephone Encounter (Signed)
Attempted to help pt wife trouble shoot monitor. But unsuccessful instructed pt wife to call tech services she verbalized understanding.

## 2014-12-02 NOTE — Patient Instructions (Signed)
Your physician has recommended you make the following change in your medication: restart carvedilol as directed.   Your physician recommends that you schedule a follow-up appointment in: 4 months.

## 2014-12-02 NOTE — Telephone Encounter (Signed)
New message     Pt wife calling about letter received regarding transmissions Machine has error message that say "[id number]"

## 2014-12-04 ENCOUNTER — Encounter: Payer: Self-pay | Admitting: Cardiovascular Disease

## 2014-12-04 DIAGNOSIS — R000 Tachycardia, unspecified: Secondary | ICD-10-CM | POA: Insufficient documentation

## 2014-12-04 NOTE — Progress Notes (Signed)
Patient ID: Gregory Wiley, male   DOB: Sep 25, 1950, 64 y.o.   MRN: [id number]     HPI: Gregory Wiley is a 64 y.o. male presents to the office today for 3 month follow-up cardiology evaluation. He is a former patient of Dr. Rex Kras.  Mr. Borkenhagen has established CAD and underwent initial stenting of his RCA in May 2011. He is s/p CABG surgery with LIMA to the LAD, sequential vein to the distal RCA and PDA, and vein to the acute marginal branch of his right coronary artery. In April 2013 he developed unstable angina and ruled in for non-ST segment elevation myocardial infarction. Catheterization revealed 99% stenosis of the large left circumflex vessel and he underwent successful stenting with insertion of a 4.0x18 mm resolute DES stent post dilated to 4.5 mm.  Additional problems include obstructive sleep apnea. On his initial polysomnogram in 2010 AHI was 11.3 overall, but during REM sleep he had moderate sleep apnea with an AHI of 24.8. He never did initiate CPAP therapy at that time. However, since his myocardial infarction he has been using CPAP therapy since July 2013.   He has a history of hyperlipidemia, hypertension, and morbid obesity. An echo Doppler study in May 2013 suggested an ejection fraction of 38% and on nuclear imaging  was 37% with scar in the LAD territory.  He was admitted to [facility] hospital in November 2015 with a large right MCA territory CVA.  TEE was negative for embolic source.  EF was 45% on echocardiogram.  He underwent implantable loop recorder by Dr. Rayann Heman. He was readmitted with bilateral submassive pulmonary embolism.  Venous Dopplers were positive for DVT.  Ejection fraction was 25-30%, which is felt to be contributed by strain from his pulmonary emboli.  He had mild troponin elevation which was felt to be due to demand ischemia and has been on Xarelto for anticoagulation.  He was recently admitted on the [facility] hospitalist service on 11/11/2014 with dehydration.  Several of  his medications were held.  I reviewed his hospital record.  He had a history of nausea and vomiting that had been going on for approximatey a month.  He has continued to feel weak and in his words, "feels terrible" since his hospitalization.  He has had issues with headaches which she did see neurology.  He has not been eating well.  He seeing Dr. Damita Dunnings at [facility] for primary care.  He denies chest pressure.  He denies fevers.  He is on Xarelto anticoagulation and denies overt bleeding.  There is a remote history of erosive gastroduodenitis, GERD, esophageal stricture for which in the past he had been seen by Dr. Fuller Plan.  He presents for evaluation. Past Medical History  Diagnosis Date  . Coronary artery disease     stent, then CABG 07/20/10  . Hypertension   . Hyperlipidemia   . Obesity (BMI 30-39.9)   . H/O cardiomyopathy     ischemic, now last echo 07/30/11, EF 38%W  . Diverticulosis   . Internal hemorrhoids   . Tubular adenoma of colon   . Plantar fasciitis of left foot   . Basal cell carcinoma of skin of left ankle   . NSTEMI (non-ST elevated myocardial infarction)     NSTEMI- last cath 06/2011-Stent to LCX-DES, last nuc 07/30/11 low risk  . MI (myocardial infarction)     "I had 4 before I retired in 2006" (11/11/2014)  . Pulmonary embolism 03/2014  . OSA on CPAP  since July 2013  . GERD (gastroesophageal reflux disease)   . Bleeding stomach ulcer 2015  . Daily headache     "for the past month" (11/11/2014)  . Stroke 01/2014    "no control of LUE, can slightly LLE; memory problems since" (11/11/2014)  . Arthritis     "back" (11/11/2014)  . Gout   . Depression     Past Surgical History  Procedure Laterality Date  . Coronary artery bypass graft  07/20/2010     LIMA-LAD; VG-ACUTE MARG of RCA; Seq VG-distal RCA & then pda  . Coronary angioplasty with stent placement  07/01/2011    DES-Resolute to native LCX  . Coronary angioplasty with stent placement  12/01/2007    COMPLEX 5  LESION PCI INCLUDING CUTTING BALLOON AND 4 CYPHER DESs  . Knee arthroscopy Right   . Hand surgery Right     to take glass out  . Colonoscopy N/A 02/10/2013    Procedure: COLONOSCOPY;  Surgeon: Ladene Artist, MD;  Location: [facility] ENDOSCOPY;  Service: Endoscopy;  Laterality: N/A;  . Retinal detachment surgery Right   . Loop recorder implant  02/15/2014    MDT LINQ implanted by Dr Rayann Heman for cryptogenic stroke  . Tee without cardioversion N/A 02/15/2014    Procedure: TRANSESOPHAGEAL ECHOCARDIOGRAM (TEE);  Surgeon: Josue Hector, MD;  Location: [facility] ENDOSCOPY;  Service: Cardiovascular;  Laterality: N/A;  . Left heart catheterization with coronary/graft angiogram  07/01/2011    Procedure: LEFT HEART CATHETERIZATION WITH Beatrix Fetters;  Surgeon: Sanda Klein, MD;  Location: [place] CATH LAB;  Service: Cardiovascular;;  . Percutaneous coronary stent intervention (pci-s) Right 07/01/2011    Procedure: PERCUTANEOUS CORONARY STENT INTERVENTION (PCI-S);  Surgeon: Sanda Klein, MD;  Location: [facility] CATH LAB;  Service: Cardiovascular;  Laterality: Right;  . Umbilical hernia repair  2006  . Hernia repair    . Basal cell carcinoma excision Left 2015    ankle  . Esophagogastroduodenoscopy  01/2014    gastric ulcer, erosive gastroduodenitis, H Pylori negative.   . Esophagogastroduodenoscopy (egd) with propofol N/A 11/14/2014    Procedure: ESOPHAGOGASTRODUODENOSCOPY (EGD) WITH PROPOFOL;  Surgeon: Milus Banister, MD;  Location: [place];  Service: Endoscopy;  Laterality: N/A;    Allergies  Allergen Reactions  . Diphenhydramine Hcl Anaphylaxis    Current Outpatient Prescriptions  Medication Sig Dispense Refill  . acetaminophen (TYLENOL) 325 MG tablet Take 2 tablets (650 mg total) by mouth every 6 (six) hours as needed for mild pain.    Marland Kitchen ALPRAZolam (XANAX) 0.25 MG tablet Take 0.5 tablets (0.125 mg total) by mouth every 8 (eight) hours as needed for anxiety. 45 tablet 5  . colchicine 0.6 MG tablet  Take 0.6 mg by mouth as needed (for gout).   0  . feeding supplement, ENSURE ENLIVE, (ENSURE ENLIVE) LIQD Take 237 mLs by mouth 2 (two) times daily between meals. 60 Bottle 0  . fluticasone (FLONASE) 50 MCG/ACT nasal spray Place 2 sprays into both nostrils daily.  2  . hydrocortisone (ANUSOL-HC) 25 MG suppository Place 1 suppository (25 mg total) rectally daily. X 1 week; then, use as needed thereafter. 30 suppository 11  . imipramine (TOFRANIL) 25 MG tablet Take 1 tablet (25 mg total) by mouth at bedtime. 30 tablet 3  . meclizine (ANTIVERT) 25 MG tablet Take 1 tablet (25 mg total) by mouth 3 (three) times daily as needed for dizziness. (Patient taking differently: Take 25 mg by mouth 3 (three) times daily as needed for dizziness (bed-time). ) 30  tablet 0  . Melatonin 10 MG CAPS Take 1 capsule by mouth at bedtime.    . methylphenidate (RITALIN) 10 MG tablet Take up to 3 times a day po 90 tablet 0  . nitroGLYCERIN (NITROSTAT) 0.4 MG SL tablet Place 0.4 mg under the tongue every 5 (five) minutes as needed. For chest pain    . ondansetron (ZOFRAN) 4 MG tablet Take 1 tablet (4 mg total) by mouth every 12 (twelve) hours. 60 tablet 0  . OVER THE COUNTER MEDICATION Inhale 1 application into the lungs at bedtime. C-PAP    . pantoprazole (PROTONIX) 40 MG tablet TAKE 1 TABLET BY MOUTH 2 TIMES A DAY. 60 tablet 3  . polyethylene glycol powder (GLYCOLAX/MIRALAX) powder Take 1 Container by mouth 3 (three) times daily.    . promethazine (PHENERGAN) 12.5 MG tablet Take 1-2 tablets (12.5-25 mg total) by mouth every 6 (six) hours as needed for nausea or vomiting. 30 tablet 0  . rivaroxaban (XARELTO) 20 MG TABS tablet Take 1 tablet (20 mg total) by mouth daily with supper. 30 tablet 11  . rosuvastatin (CRESTOR) 40 MG tablet Take 1 tablet (40 mg total) by mouth daily. 90 tablet 0  . Sennosides (SENOKOT PO) Take 2 tablets by mouth daily.    . Vitamin D, Ergocalciferol, (DRISDOL) 50000 UNITS CAPS capsule Take 50,000  Units by mouth every 7 (seven) days. Take on Mondays    . carvedilol (COREG) 6.25 MG tablet Take 0.5 tablets (3.125 mg total) by mouth 2 (two) times daily. 60 tablet 3   No current facility-administered medications for this visit.    Social History   Social History  . Marital Status: Married    Spouse Name: N/A  . Number of Children: 5  . Years of Education: N/A   Occupational History  .      works at the [employer]  . Smoking status: Former Smoker -- 2.00 packs/day for 4 years    Types: Cigarettes  . Smokeless tobacco: Never Used     Comment: "quit smoking cigarettes  in the 1970's"  . Alcohol Use: 0.0 oz/week    0 Standard drinks or equivalent per week     Comment: 11/11/2014 "1-2 times per year"  . Drug Use: Yes    Special: Marijuana     Comment: "smoked some pot in college"  . Sexual Activity: Not Currently   Other Topics Concern  . 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 Diabetes type II Mother   . Coronary artery disease    . Colon cancer Neg Hx   . CAD Father 21    CABG, PCI, died at 48  . Heart attack Father 33  . Hypertension Brother   . Diabetes Brother   . Hyperlipidemia Brother   . Brain cancer Maternal Grandmother   . COPD Paternal Grandfather   . Other      denies family history of DVT/PE  . Stroke Neg Hx    Additional social history is notable in that he is married has 5 children 4 grandchildren. He does try to walk. There is no tobacco or alcohol use.  ROS General: Negative; No fevers, chills, or night sweats;  HEENT: Negative; No changes in  vision or hearing, sinus congestion, difficulty swallowing Pulmonary: Negative; No cough, wheezing, shortness of breath, hemoptysis Cardiovascular: see HPI GI: History of recent nausea and  vomiting. GU: Negative; No dysuria, hematuria, or difficulty voiding Musculoskeletal: Negative; no myalgias, joint pain, or weakness Hematologic/Oncology: Negative; no easy bruising, bleeding Endocrine: Negative; no heat/cold intolerance; no diabetes Neuro: Right MCA stroke with residual left-sided weakness Skin: Negative; No rashes or skin lesions Psychiatric: Negative; No behavioral problems, depression Sleep: Positive for sleep apnea on CPAP; No snoring, daytime sleepiness, hypersomnolence, bruxism, restless legs, hypnogognic hallucinations, no cataplexy Other comprehensive 14 point system review is negative.  PE BP 102/80 mmHg  Pulse 118  Ht 6' (1.829 m)  Wt 211 lb (95.709 kg)  BMI 28.61 kg/m2  Wt Readings from Last 3 Encounters:  12/02/14 211 lb (95.709 kg)  11/30/14 226 lb (102.513 kg)  11/18/14 226 lb (102.513 kg)   General: Alert, oriented, no distress.  Skin: normal turgor, no rashes HEENT: Normocephalic, atraumatic. Pupils round and reactive; sclera anicteric;no lid lag.  Nose without nasal septal hypertrophy Mouth/Parynx benign; Mallinpatti scale 3/4 Neck: Thick neck; No JVD, no carotid briuts Lungs: clear to ausculatation and percussion; no wheezing or rales Heart: RRR, s1 s2 normal 1/6 sem; no diastolic murmur.  No rubs thrills or heaves. Abdomen: soft, nontender; no hepatosplenomehaly, BS+; abdominal aorta nontender and not dilated by palpation. Pulses 2+ Extremities: no clubbing cyanosis or edema, Homan's sign negative  Neurologic: Residual hemiparesis on the left. Psychologic: normal affect and mood.  ECG (independently read by me): Sinus tachycardia 1 18 bpm.  Left axis deviation.  Cannot rule out old anterolateral infarct.  ECG (independently read by me): Normal sinus rhythm at 93 bpm.  QS complex anteriorly compatible with old anterior MI  April 2016 ECG (independently read by me): Normal sinus rhythm at 79 bpm.  Old anteroseptal MI with QS complex V1  through V3.  LVH by voltage criteria in aVL.  Increased QTc interval 488 ms.  ECG: Normal sinus rhythm at 60. QS complex in V1 through V3 concordant with his documented LAD region scar. T-wave inversion in leads 1 and avL unchanged.   LABS:  BMP Latest Ref Rng 11/14/2014 11/13/2014 11/12/2014  Glucose 65 - 99 mg/dL 86 112(H) 98  BUN 6 - 20 mg/dL 6 7 13  Creatinine 0.61 - 1.24 mg/dL 1.12 1.14 1.27(H)  Sodium 135 - 145 mmol/L 137 135 135  Potassium 3.5 - 5.1 mmol/L 3.8 2.8(L) 2.7(LL)  Chloride 101 - 111 mmol/L 106 105 102  CO2 22 - 32 mmol/L 25 25 23  Calcium 8.9 - 10.3 mg/dL 8.4(L) 8.1(L) 8.6(L)    Hepatic Function Latest Ref Rng 11/11/2014 10/28/2014 07/22/2014  Total Protein 6.5 - 8.1 g/dL 7.6 7.7 7.1  Albumin 3.5 - 5.0 g/dL 3.5 3.8 3.7  AST 15 - 41 U/L 21 18 13  ALT 17 - 63 U/L 14(L) 12 11  Alk Phosphatase 38 - 126 U/L 73 63 55  Total Bilirubin 0.3 - 1.2 mg/dL 1.4(H) 0.6 0.6    CBC Latest Ref Rng 11/14/2014 11/13/2014 11/12/2014  WBC 4.0 - 10.5 K/uL 5.8 6.1 7.1  Hemoglobin 13.0 - 17.0 g/dL 13.3 13.2 14.1  Hematocrit 39.0 - 52.0 % 38.5(L) 39.3 41.1  Platelets 150 - 400 K/uL 114(L) 121(L) 155   Lab Results  Component Value Date   MCV 84.2 11/14/2014   MCV 84.5 11/13/2014   MCV 82.9 11/12/2014   Lab Results  Component Value Date   TSH 0.746 11/11/2014     Lab Results  Component Value Date   HGBA1C 5.6 02/13/2014    BNP    Component Value Date/Time   PROBNP 1224.0* 06/30/2011 0202    Lipid Panel     Component Value Date/Time   CHOL 113 07/22/2014 1129   CHOL 199 01/04/2013 1206   TRIG 104 07/22/2014 1129   TRIG 113 01/04/2013 1206   HDL 38* 07/22/2014 1129   HDL 47 01/04/2013 1206   CHOLHDL 3.0 07/22/2014 1129   VLDL 21 07/22/2014 1129   LDLCALC 54 07/22/2014 1129   LDLCALC 129* 01/04/2013 1206     RADIOLOGY: No results found.    ASSESSMENT AND PLAN: Mr. Tubby is a 64 year old gentleman with a history of morbid obesity, hypertension, known coronary  artery disease who is status post initial stenting of his RCA in May 2011 with subsequent CABG surgery. In April 2013 he was found to have subtotal stenosis in a very large circumflex vessel and underwent successful insertion of a large Resolute DES stent. His last nuclear perfusion study in May 2013  revealed an ejection fraction approximately 37%.   He has suffered a large right MCA CVA.  He has implantable loop recorder to assess for potential atrial fibrillation.  He subsequently suffered bilateral submassive pulmonary emboli and has developed a cardiomyopathy with a drop in ejection fraction to 25-30% noted at that time.  He was recently hospitalized.  A follow-up CT revealed a chronic filling defect in the distal left vertebral artery compatible with soft plaque or thromboembolic disease.  He has soft calcified plaque in the right vertebral artery region.  He had improved right middle cerebral artery.  Patency since November 2015 without intracranial arterial occlusion.  There was evidence for his chronic right MCA infarct without acute intracranial abnormality.  At present his blood pressure is normal but low.  He is tachycardic.  I have recommended resumption of very low-dose carvedilol at 3.125 mg twice a day.  He will be following up with his primary physician for his headache.  He's not having anginal symptoms.  He had recently been started on Ritalin which may be contributing to his increased heart rate.  He does not believe this is helping and it may be worthwhile to consider discontinuing this treatment.  He will follow-up with his primary M.D. and I will see him in 3-4 months for reevaluation.    Time spent: 25 minutes  [name], MD, [facility]  12/04/2014 11:09 PM

## 2014-12-05 ENCOUNTER — Other Ambulatory Visit: Payer: Self-pay | Admitting: Neurology

## 2014-12-05 ENCOUNTER — Telehealth: Payer: Self-pay | Admitting: Neurology

## 2014-12-05 MED ORDER — HYDROCODONE-ACETAMINOPHEN 5-325 MG PO TABS: ORAL_TABLET | ORAL | Status: DC

## 2014-12-05 NOTE — Telephone Encounter (Signed)
Rx. up front [facility]/fim 

## 2014-12-05 NOTE — Telephone Encounter (Signed)
Patients wife called stating he was seen by Dr Felecia Shelling on 12/02/14. He has a bad headache that has not gotten any better. Dr Felecia Shelling told him to call if not any better. Please call and advise. Patients wife can be reached at [phone].

## 2014-12-05 NOTE — Telephone Encounter (Signed)
I have spoken with Gregory Wiley and per RAS, advised that Gregory Wiley should continue Imipramine as he has not been on it long enough to realize it's full benefit yet, and that RAS will provide rx. for Hydrocodone 5/325mg , one tab up to twice daily prn h/a.  She verbalized understanding of same, will pick rx. up this afternoon.  Rx. printed, awaiting [facility] sig/fim

## 2014-12-07 ENCOUNTER — Encounter: Payer: Self-pay | Admitting: Cardiology

## 2014-12-08 ENCOUNTER — Telehealth: Payer: Self-pay

## 2014-12-08 NOTE — Telephone Encounter (Signed)
I called patient and asked about depression screening. Patient said that he is having a rough time, and just wishes he could make his bad mood go away. Patient states that he doesn't drink, but he feels drunk all the time. Patient denied having any thoughts of hurting himself or others. I asked him if he would feel up to discussing this with Dr. Damita Dunnings, and the patient said that he would love to, because he needs this to be fixed. I asked patient about his schedule, and he said that sometime next week would be fine. Appointment scheduled with Dr. Damita Dunnings next Tuesday, 12/13/14.

## 2014-12-08 NOTE — Telephone Encounter (Signed)
Noted. Thanks.

## 2014-12-13 ENCOUNTER — Ambulatory Visit (INDEPENDENT_AMBULATORY_CARE_PROVIDER_SITE_OTHER): Payer: Medicare Other | Admitting: Family Medicine

## 2014-12-13 ENCOUNTER — Encounter: Payer: Self-pay | Admitting: Family Medicine

## 2014-12-13 VITALS — BP 98/60 | HR 87 | Temp 98.1°F | Wt 200.0 lb

## 2014-12-13 DIAGNOSIS — I6350 Cerebral infarction due to unspecified occlusion or stenosis of unspecified cerebral artery: Secondary | ICD-10-CM | POA: Diagnosis not present

## 2014-12-13 DIAGNOSIS — IMO0002 Reserved for concepts with insufficient information to code with codable children: Secondary | ICD-10-CM

## 2014-12-13 DIAGNOSIS — F329 Major depressive disorder, single episode, unspecified: Secondary | ICD-10-CM | POA: Diagnosis not present

## 2014-12-13 MED ORDER — ONDANSETRON HCL 4 MG PO TABS: 4.0000 mg | ORAL_TABLET | Freq: Two times a day (BID) | ORAL | Status: DC

## 2014-12-13 MED ORDER — MIRTAZAPINE 15 MG PO TABS: 15.0000 mg | ORAL_TABLET | Freq: Every day | ORAL | Status: DC

## 2014-12-13 MED ORDER — NONFORMULARY OR COMPOUNDED ITEM: Status: DC

## 2014-12-13 NOTE — Patient Instructions (Signed)
I would try the remeron and update me as needed.  Take care.  Glad to see you.

## 2014-12-14 DIAGNOSIS — F329 Major depressive disorder, single episode, unspecified: Secondary | ICD-10-CM | POA: Insufficient documentation

## 2014-12-14 DIAGNOSIS — F32A Depression, unspecified: Secondary | ICD-10-CM | POA: Insufficient documentation

## 2014-12-14 NOTE — Progress Notes (Signed)
Failed depression screening at home.  Here for f/u.  No SI/HI.  Sleep is disrupted, not sleeping well.  Fatigued.  Less concentration.  Weight is down, appetite is down.  Vomiting has improved with zofran, will likely need to continue and not just use prn.  Hadn't vomited in days until once this AM.   D/w pt about options.  Mood low since CVA, with clear motor debility noted.   Meds, vitals, and allergies reviewed.   ROS: See HPI.  Otherwise, noncontributory.  nad In wheelchair. Flat affect Speech at baseline, slow Mmm rrr ctab abd soft Ext with trace edema Weakness L arm and leg noted, baseline.

## 2014-12-14 NOTE — Assessment & Plan Note (Signed)
Likely from the cva itself or from downstream debility from cva.  D/w pt.  He declined counseling.  With appetite and sleep changes, would be reasonable to try remeron.  He'll consider, rx sent.  Appears to be able to make informed consent.   No SIHI Okay for outpatient f/u.  Update me as needed.   Continue zofran.  rx resent in case PA is needed on med.   >25 minutes spent in face to face time with patient, >50% spent in counselling or coordination of care.

## 2014-12-15 ENCOUNTER — Telehealth: Payer: Self-pay

## 2014-12-15 ENCOUNTER — Telehealth: Payer: Self-pay | Admitting: *Deleted

## 2014-12-15 ENCOUNTER — Encounter: Payer: Self-pay | Admitting: Cardiology

## 2014-12-15 MED ORDER — MIRTAZAPINE 15 MG PO TABS: 7.5000 mg | ORAL_TABLET | Freq: Every day | ORAL | Status: DC

## 2014-12-15 NOTE — Telephone Encounter (Signed)
I routed this over to Dr. Claiborne Billings for his input.   App cards input.  Thanks.

## 2014-12-15 NOTE — Telephone Encounter (Signed)
Ms Wenner left v/m (DPR signed) pt seen 12/13/14 and started Remeron 15 mg taking one at hs. Ms Totty cannot get pt up; pt wants to sleep constantly and Ms Nickolson wants to know if can cut pill in half to see if pt will sleep less. Ms Profitt request cb.

## 2014-12-15 NOTE — Telephone Encounter (Signed)
Notified patient's wife of Dr. Josefine Class comments. She verbalized understanding.  Patient's wife says that PT is coming out tomorrow, and they are a little concerned about his low blood pressure. Patient's wife states that she can't really get through to Dr. Claiborne Billings about adjusting his Coreg, in hopes of getting his blood pressure a little higher. Patient's wife wants to know if you could talk to Dr. Claiborne Billings and advise him of these low blood pressure readings, and see if the Coreg could be adjusted? Thanks!

## 2014-12-15 NOTE — Telephone Encounter (Signed)
I would stop the med for now.   He should improve soon.  The med can cause sedation, but usually it isn't that dramatic.  Would be okay to try 1/2 tab in about 2 days.   I didn't expect that type of response at that dose.   Thanks.

## 2014-12-15 NOTE — Telephone Encounter (Signed)
I'll work on the hard copy.  Thanks.  

## 2014-12-15 NOTE — Telephone Encounter (Signed)
Received prior auth forms from pharmacy for Zofran. Forms placed in your inbox.

## 2014-12-18 DIAGNOSIS — I69352 Hemiplegia and hemiparesis following cerebral infarction affecting left dominant side: Secondary | ICD-10-CM | POA: Diagnosis not present

## 2014-12-18 DIAGNOSIS — I69398 Other sequelae of cerebral infarction: Secondary | ICD-10-CM | POA: Diagnosis not present

## 2014-12-18 DIAGNOSIS — R42 Dizziness and giddiness: Secondary | ICD-10-CM | POA: Diagnosis not present

## 2014-12-18 DIAGNOSIS — I110 Hypertensive heart disease with heart failure: Secondary | ICD-10-CM

## 2014-12-18 DIAGNOSIS — N179 Acute kidney failure, unspecified: Secondary | ICD-10-CM

## 2014-12-18 DIAGNOSIS — E860 Dehydration: Secondary | ICD-10-CM | POA: Diagnosis not present

## 2014-12-18 NOTE — Telephone Encounter (Signed)
Thank you. Please have your staff notify pt/family or please route to [facility] if we need to contact patient.

## 2014-12-18 NOTE — Telephone Encounter (Signed)
Please route as noted

## 2014-12-18 NOTE — Telephone Encounter (Signed)
Try reducing coreg to 1/2 of 3.125 bid; if BP < 100; then dc

## 2014-12-20 ENCOUNTER — Telehealth: Payer: Self-pay | Admitting: Family Medicine

## 2014-12-20 ENCOUNTER — Encounter (HOSPITAL_COMMUNITY): Payer: Self-pay | Admitting: *Deleted

## 2014-12-20 ENCOUNTER — Inpatient Hospital Stay (HOSPITAL_COMMUNITY): Payer: Medicare Other

## 2014-12-20 ENCOUNTER — Emergency Department (HOSPITAL_COMMUNITY): Payer: Medicare Other

## 2014-12-20 ENCOUNTER — Inpatient Hospital Stay (HOSPITAL_COMMUNITY)
Admission: EM | Admit: 2014-12-20 | Discharge: 2014-12-27 | DRG: 871 | Disposition: A | Discharge status: Home Health | Payer: Medicare Other | Attending: Internal Medicine | Admitting: Internal Medicine

## 2014-12-20 DIAGNOSIS — G4733 Obstructive sleep apnea (adult) (pediatric): Secondary | ICD-10-CM | POA: Diagnosis present

## 2014-12-20 DIAGNOSIS — Z951 Presence of aortocoronary bypass graft: Secondary | ICD-10-CM | POA: Diagnosis not present

## 2014-12-20 DIAGNOSIS — H53489 Generalized contraction of visual field, unspecified eye: Secondary | ICD-10-CM | POA: Diagnosis present

## 2014-12-20 DIAGNOSIS — I63411 Cerebral infarction due to embolism of right middle cerebral artery: Secondary | ICD-10-CM | POA: Diagnosis present

## 2014-12-20 DIAGNOSIS — M109 Gout, unspecified: Secondary | ICD-10-CM | POA: Diagnosis present

## 2014-12-20 DIAGNOSIS — G459 Transient cerebral ischemic attack, unspecified: Secondary | ICD-10-CM

## 2014-12-20 DIAGNOSIS — R471 Dysarthria and anarthria: Secondary | ICD-10-CM | POA: Diagnosis present

## 2014-12-20 DIAGNOSIS — E2740 Unspecified adrenocortical insufficiency: Secondary | ICD-10-CM | POA: Diagnosis present

## 2014-12-20 DIAGNOSIS — I63312 Cerebral infarction due to thrombosis of left middle cerebral artery: Secondary | ICD-10-CM | POA: Diagnosis not present

## 2014-12-20 DIAGNOSIS — I429 Cardiomyopathy, unspecified: Secondary | ICD-10-CM | POA: Diagnosis present

## 2014-12-20 DIAGNOSIS — M8548 Solitary bone cyst, other site: Secondary | ICD-10-CM | POA: Diagnosis present

## 2014-12-20 DIAGNOSIS — I2510 Atherosclerotic heart disease of native coronary artery without angina pectoris: Secondary | ICD-10-CM | POA: Diagnosis present

## 2014-12-20 DIAGNOSIS — I2699 Other pulmonary embolism without acute cor pulmonale: Secondary | ICD-10-CM | POA: Diagnosis not present

## 2014-12-20 DIAGNOSIS — Z888 Allergy status to other drugs, medicaments and biological substances status: Secondary | ICD-10-CM | POA: Diagnosis not present

## 2014-12-20 DIAGNOSIS — E785 Hyperlipidemia, unspecified: Secondary | ICD-10-CM | POA: Diagnosis present

## 2014-12-20 DIAGNOSIS — Z85828 Personal history of other malignant neoplasm of skin: Secondary | ICD-10-CM | POA: Diagnosis not present

## 2014-12-20 DIAGNOSIS — M899 Disorder of bone, unspecified: Secondary | ICD-10-CM

## 2014-12-20 DIAGNOSIS — Z7901 Long term (current) use of anticoagulants: Secondary | ICD-10-CM | POA: Diagnosis not present

## 2014-12-20 DIAGNOSIS — I69354 Hemiplegia and hemiparesis following cerebral infarction affecting left non-dominant side: Secondary | ICD-10-CM

## 2014-12-20 DIAGNOSIS — I255 Ischemic cardiomyopathy: Secondary | ICD-10-CM | POA: Diagnosis present

## 2014-12-20 DIAGNOSIS — Z87891 Personal history of nicotine dependence: Secondary | ICD-10-CM

## 2014-12-20 DIAGNOSIS — Z23 Encounter for immunization: Secondary | ICD-10-CM

## 2014-12-20 DIAGNOSIS — E860 Dehydration: Secondary | ICD-10-CM | POA: Diagnosis present

## 2014-12-20 DIAGNOSIS — A419 Sepsis, unspecified organism: Secondary | ICD-10-CM | POA: Diagnosis present

## 2014-12-20 DIAGNOSIS — E876 Hypokalemia: Secondary | ICD-10-CM | POA: Diagnosis present

## 2014-12-20 DIAGNOSIS — J9811 Atelectasis: Secondary | ICD-10-CM | POA: Diagnosis present

## 2014-12-20 DIAGNOSIS — K5900 Constipation, unspecified: Secondary | ICD-10-CM | POA: Diagnosis present

## 2014-12-20 DIAGNOSIS — Z86711 Personal history of pulmonary embolism: Secondary | ICD-10-CM | POA: Diagnosis not present

## 2014-12-20 DIAGNOSIS — I951 Orthostatic hypotension: Secondary | ICD-10-CM | POA: Diagnosis present

## 2014-12-20 DIAGNOSIS — Z955 Presence of coronary angioplasty implant and graft: Secondary | ICD-10-CM | POA: Diagnosis not present

## 2014-12-20 DIAGNOSIS — IMO0002 Reserved for concepts with insufficient information to code with codable children: Secondary | ICD-10-CM

## 2014-12-20 DIAGNOSIS — Z66 Do not resuscitate: Secondary | ICD-10-CM | POA: Diagnosis present

## 2014-12-20 DIAGNOSIS — N170 Acute kidney failure with tubular necrosis: Secondary | ICD-10-CM | POA: Diagnosis not present

## 2014-12-20 DIAGNOSIS — R42 Dizziness and giddiness: Secondary | ICD-10-CM

## 2014-12-20 DIAGNOSIS — Z6827 Body mass index (BMI) 27.0-27.9, adult: Secondary | ICD-10-CM | POA: Diagnosis not present

## 2014-12-20 DIAGNOSIS — I252 Old myocardial infarction: Secondary | ICD-10-CM | POA: Diagnosis not present

## 2014-12-20 DIAGNOSIS — I25118 Atherosclerotic heart disease of native coronary artery with other forms of angina pectoris: Secondary | ICD-10-CM

## 2014-12-20 DIAGNOSIS — E43 Unspecified severe protein-calorie malnutrition: Secondary | ICD-10-CM | POA: Diagnosis present

## 2014-12-20 DIAGNOSIS — I110 Hypertensive heart disease with heart failure: Secondary | ICD-10-CM | POA: Diagnosis present

## 2014-12-20 DIAGNOSIS — K219 Gastro-esophageal reflux disease without esophagitis: Secondary | ICD-10-CM | POA: Diagnosis present

## 2014-12-20 DIAGNOSIS — N179 Acute kidney failure, unspecified: Secondary | ICD-10-CM | POA: Diagnosis present

## 2014-12-20 DIAGNOSIS — I5042 Chronic combined systolic (congestive) and diastolic (congestive) heart failure: Secondary | ICD-10-CM

## 2014-12-20 DIAGNOSIS — G8194 Hemiplegia, unspecified affecting left nondominant side: Secondary | ICD-10-CM | POA: Diagnosis not present

## 2014-12-20 DIAGNOSIS — Z79899 Other long term (current) drug therapy: Secondary | ICD-10-CM

## 2014-12-20 DIAGNOSIS — E669 Obesity, unspecified: Secondary | ICD-10-CM | POA: Diagnosis present

## 2014-12-20 DIAGNOSIS — G8192 Hemiplegia, unspecified affecting left dominant side: Secondary | ICD-10-CM

## 2014-12-20 DIAGNOSIS — J189 Pneumonia, unspecified organism: Secondary | ICD-10-CM | POA: Diagnosis present

## 2014-12-20 DIAGNOSIS — R202 Paresthesia of skin: Secondary | ICD-10-CM | POA: Diagnosis present

## 2014-12-20 DIAGNOSIS — R200 Anesthesia of skin: Secondary | ICD-10-CM

## 2014-12-20 DIAGNOSIS — K5909 Other constipation: Secondary | ICD-10-CM

## 2014-12-20 DIAGNOSIS — R29810 Facial weakness: Secondary | ICD-10-CM | POA: Diagnosis present

## 2014-12-20 DIAGNOSIS — M1990 Unspecified osteoarthritis, unspecified site: Secondary | ICD-10-CM | POA: Diagnosis present

## 2014-12-20 DIAGNOSIS — I9589 Other hypotension: Secondary | ICD-10-CM | POA: Diagnosis not present

## 2014-12-20 DIAGNOSIS — Y95 Nosocomial condition: Secondary | ICD-10-CM | POA: Diagnosis present

## 2014-12-20 LAB — COMPREHENSIVE METABOLIC PANEL
ALT: 43 U/L (ref 17–63)
AST: 45 U/L — ABNORMAL HIGH (ref 15–41)
Albumin: 3.4 g/dL — ABNORMAL LOW (ref 3.5–5.0)
Alkaline Phosphatase: 97 U/L (ref 38–126)
Anion gap: 14 (ref 5–15)
BUN: 14 mg/dL (ref 6–20)
CO2: 25 mmol/L (ref 22–32)
Calcium: 10.1 mg/dL (ref 8.9–10.3)
Chloride: 95 mmol/L — ABNORMAL LOW (ref 101–111)
Creatinine, Ser: 1.62 mg/dL — ABNORMAL HIGH (ref 0.61–1.24)
GFR calc Af Amer: 50 mL/min — ABNORMAL LOW (ref >60)
GFR calc non Af Amer: 43 mL/min — ABNORMAL LOW (ref >60)
Glucose, Bld: 123 mg/dL — ABNORMAL HIGH (ref 65–99)
Potassium: 3.3 mmol/L — ABNORMAL LOW (ref 3.5–5.1)
Sodium: 134 mmol/L — ABNORMAL LOW (ref 135–145)
Total Bilirubin: 1.3 mg/dL — ABNORMAL HIGH (ref 0.3–1.2)
Total Protein: 7.8 g/dL (ref 6.5–8.1)

## 2014-12-20 LAB — URINALYSIS, ROUTINE W REFLEX MICROSCOPIC
Glucose, UA: NEGATIVE mg/dL
Ketones, ur: 15 mg/dL — AB
Leukocytes, UA: NEGATIVE
Nitrite: NEGATIVE
Protein, ur: 100 mg/dL — AB
Specific Gravity, Urine: 1.023 (ref 1.005–1.030)
Urobilinogen, UA: 0.2 mg/dL (ref 0.0–1.0)
pH: 6 (ref 5.0–8.0)

## 2014-12-20 LAB — STREP PNEUMONIAE URINARY ANTIGEN: Strep Pneumo Urinary Antigen: NEGATIVE

## 2014-12-20 LAB — CBC WITH DIFFERENTIAL/PLATELET
Basophils Absolute: 0 10*3/uL (ref 0.0–0.1)
Basophils Relative: 0 %
Eosinophils Absolute: 0.2 10*3/uL (ref 0.0–0.7)
Eosinophils Relative: 2 %
HCT: 51.7 % (ref 39.0–52.0)
Hemoglobin: 17.6 g/dL — ABNORMAL HIGH (ref 13.0–17.0)
Lymphocytes Relative: 23 %
Lymphs Abs: 2.3 10*3/uL (ref 0.7–4.0)
MCH: 29 pg (ref 26.0–34.0)
MCHC: 34 g/dL (ref 30.0–36.0)
MCV: 85.2 fL (ref 78.0–100.0)
Monocytes Absolute: 0.8 10*3/uL (ref 0.1–1.0)
Monocytes Relative: 7 %
Neutro Abs: 6.8 10*3/uL (ref 1.7–7.7)
Neutrophils Relative %: 68 %
Platelets: 189 10*3/uL (ref 150–400)
RBC: 6.07 MIL/uL — ABNORMAL HIGH (ref 4.22–5.81)
RDW: 15.3 % (ref 11.5–15.5)
WBC: 10.1 10*3/uL (ref 4.0–10.5)

## 2014-12-20 LAB — URINE MICROSCOPIC-ADD ON

## 2014-12-20 LAB — I-STAT TROPONIN, ED: Troponin i, poc: 0.01 ng/mL (ref 0.00–0.08)

## 2014-12-20 LAB — MRSA PCR SCREENING: MRSA by PCR: NEGATIVE

## 2014-12-20 LAB — I-STAT CG4 LACTIC ACID, ED: Lactic Acid, Venous: 3.17 mmol/L (ref 0.5–2.0)

## 2014-12-20 LAB — LACTIC ACID, PLASMA
Lactic Acid, Venous: 1.1 mmol/L (ref 0.5–2.0)
Lactic Acid, Venous: 2.3 mmol/L (ref 0.5–2.0)

## 2014-12-20 MED ORDER — FLUTICASONE PROPIONATE 50 MCG/ACT NA SUSP
2.0000 | Freq: Every day | NASAL | Status: DC | PRN
  Filled 2014-12-20: qty 16

## 2014-12-20 MED ORDER — MAGNESIUM HYDROXIDE 400 MG/5ML PO SUSP
30.0000 mL | Freq: Every day | ORAL | Status: DC | PRN
Start: 2014-12-20 — End: 2014-12-27

## 2014-12-20 MED ORDER — SODIUM CHLORIDE 0.9 % IV SOLN
INTRAVENOUS | Status: DC
  Administered 2014-12-20 – 2014-12-24 (×6): via INTRAVENOUS

## 2014-12-20 MED ORDER — COLCHICINE 0.6 MG PO TABS
0.6000 mg | ORAL_TABLET | ORAL | Status: DC | PRN
  Administered 2014-12-24: 0.6 mg via ORAL
  Filled 2014-12-20 (×2): qty 1

## 2014-12-20 MED ORDER — SORBITOL 70 % SOLN
30.0000 mL | Freq: Every day | Status: DC | PRN
Start: 2014-12-20 — End: 2014-12-27
  Filled 2014-12-20: qty 30

## 2014-12-20 MED ORDER — VANCOMYCIN HCL IN DEXTROSE 750-5 MG/150ML-% IV SOLN
750.0000 mg | Freq: Two times a day (BID) | INTRAVENOUS | Status: DC
  Filled 2014-12-20: qty 150

## 2014-12-20 MED ORDER — SODIUM CHLORIDE 0.9 % IV BOLUS (SEPSIS)
500.0000 mL | Freq: Once | INTRAVENOUS | Status: AC
  Administered 2014-12-20: 500 mL via INTRAVENOUS

## 2014-12-20 MED ORDER — CEFTAZIDIME 2 G IJ SOLR
2.0000 g | Freq: Two times a day (BID) | INTRAMUSCULAR | Status: DC
  Administered 2014-12-21 – 2014-12-22 (×3): 2 g via INTRAVENOUS
  Filled 2014-12-20 (×3): qty 2

## 2014-12-20 MED ORDER — HYDROCORTISONE ACETATE 25 MG RE SUPP
25.0000 mg | Freq: Two times a day (BID) | RECTAL | Status: DC | PRN
  Filled 2014-12-20: qty 1

## 2014-12-20 MED ORDER — ENSURE ENLIVE PO LIQD: 237.0000 mL | Freq: Every day | ORAL | Status: DC

## 2014-12-20 MED ORDER — PNEUMOCOCCAL VAC POLYVALENT 25 MCG/0.5ML IJ INJ
0.5000 mL | INJECTION | INTRAMUSCULAR | Status: AC
  Administered 2014-12-21: 0.5 mL via INTRAMUSCULAR
  Filled 2014-12-20: qty 0.5

## 2014-12-20 MED ORDER — PANTOPRAZOLE SODIUM 40 MG PO TBEC
40.0000 mg | DELAYED_RELEASE_TABLET | Freq: Two times a day (BID) | ORAL | Status: DC
  Administered 2014-12-20 – 2014-12-27 (×14): 40 mg via ORAL
  Filled 2014-12-20 (×14): qty 1

## 2014-12-20 MED ORDER — VANCOMYCIN HCL IN DEXTROSE 1-5 GM/200ML-% IV SOLN
1000.0000 mg | Freq: Once | INTRAVENOUS | Status: AC
  Administered 2014-12-20: 1000 mg via INTRAVENOUS
  Filled 2014-12-20: qty 200

## 2014-12-20 MED ORDER — VANCOMYCIN HCL IN DEXTROSE 750-5 MG/150ML-% IV SOLN
750.0000 mg | Freq: Two times a day (BID) | INTRAVENOUS | Status: DC
  Administered 2014-12-21: 750 mg via INTRAVENOUS
  Filled 2014-12-20 (×2): qty 150

## 2014-12-20 MED ORDER — SENNA 8.6 MG PO TABS
1.0000 | ORAL_TABLET | Freq: Two times a day (BID) | ORAL | Status: DC
  Administered 2014-12-20 – 2014-12-27 (×14): 8.6 mg via ORAL
  Filled 2014-12-20 (×15): qty 1

## 2014-12-20 MED ORDER — RIVAROXABAN 20 MG PO TABS
20.0000 mg | ORAL_TABLET | Freq: Every day | ORAL | Status: DC
  Administered 2014-12-20 – 2014-12-25 (×6): 20 mg via ORAL
  Filled 2014-12-20 (×6): qty 1

## 2014-12-20 MED ORDER — MIRTAZAPINE 15 MG PO TABS
7.5000 mg | ORAL_TABLET | Freq: Every day | ORAL | Status: DC
  Administered 2014-12-20 – 2014-12-26 (×7): 7.5 mg via ORAL
  Filled 2014-12-20 (×7): qty 1

## 2014-12-20 MED ORDER — ACETAMINOPHEN 650 MG RE SUPP: 650.0000 mg | Freq: Four times a day (QID) | RECTAL | Status: DC | PRN

## 2014-12-20 MED ORDER — POTASSIUM CHLORIDE CRYS ER 20 MEQ PO TBCR
40.0000 meq | EXTENDED_RELEASE_TABLET | Freq: Once | ORAL | Status: AC
  Administered 2014-12-20: 40 meq via ORAL
  Filled 2014-12-20: qty 2

## 2014-12-20 MED ORDER — ACETAMINOPHEN 325 MG PO TABS: 650.0000 mg | ORAL_TABLET | Freq: Four times a day (QID) | ORAL | Status: DC | PRN

## 2014-12-20 MED ORDER — PROMETHAZINE HCL 25 MG PO TABS: 12.5000 mg | ORAL_TABLET | Freq: Four times a day (QID) | ORAL | Status: DC | PRN

## 2014-12-20 MED ORDER — SODIUM CHLORIDE 0.9 % IV SOLN: INTRAVENOUS | Status: DC

## 2014-12-20 MED ORDER — ONDANSETRON HCL 4 MG PO TABS: 4.0000 mg | ORAL_TABLET | Freq: Four times a day (QID) | ORAL | Status: DC | PRN

## 2014-12-20 MED ORDER — DEXTROSE 5 % IV SOLN
2.0000 g | Freq: Three times a day (TID) | INTRAVENOUS | Status: DC
  Administered 2014-12-20: 2 g via INTRAVENOUS
  Filled 2014-12-20 (×3): qty 2

## 2014-12-20 MED ORDER — ONDANSETRON HCL 4 MG/2ML IJ SOLN: 4.0000 mg | Freq: Four times a day (QID) | INTRAMUSCULAR | Status: DC | PRN

## 2014-12-20 MED ORDER — ROSUVASTATIN CALCIUM 20 MG PO TABS
40.0000 mg | ORAL_TABLET | Freq: Every day | ORAL | Status: DC
  Administered 2014-12-20 – 2014-12-26 (×7): 40 mg via ORAL
  Filled 2014-12-20: qty 4
  Filled 2014-12-20: qty 2
  Filled 2014-12-20: qty 4
  Filled 2014-12-20 (×2): qty 2
  Filled 2014-12-20: qty 4
  Filled 2014-12-20: qty 2

## 2014-12-20 MED ORDER — ALPRAZOLAM 0.25 MG PO TABS: 0.1250 mg | ORAL_TABLET | Freq: Three times a day (TID) | ORAL | Status: DC | PRN

## 2014-12-20 MED ORDER — HYDROCODONE-ACETAMINOPHEN 5-325 MG PO TABS
1.0000 | ORAL_TABLET | Freq: Four times a day (QID) | ORAL | Status: DC | PRN
  Filled 2014-12-20: qty 1

## 2014-12-20 MED ORDER — SODIUM CHLORIDE 0.9 % IV BOLUS (SEPSIS)
1000.0000 mL | INTRAVENOUS | Status: AC
  Administered 2014-12-20 (×3): 1000 mL via INTRAVENOUS

## 2014-12-20 NOTE — Telephone Encounter (Signed)
Patient Name: Gregory Wiley DOB: Feb 26, 1951 Initial Comment Caller States wes / gentiva ... pt is having low blood pressure BP 90/71, ( physical therapist says the reading is most likely closer to 80/71)complaining of dizziness, tunnel vision. would to see about getting nursing home care for the pt. Nurse Assessment Nurse: Marcelline Deist, RN, Lynda Date/Time (Eastern Time): 12/20/2014 11:07:57 AM Confirm and document reason for call. If symptomatic, describe symptoms. ---Caller states he is Wes with [place] . The pt is having low blood pressure BP 90/71, Physical therapist says the reading is most likely closer to 80/71, couldn't auscultate sounds well, so felt pulse. He is complaining of dizziness, tunnel vision. Would to see about getting nursing home care for the pt. The pt. had a stroke 10 months ago. His systolic has been running less than 100. Has lost 140 lbs. since stroke. Is taking BP rx. Has the patient traveled out of the country within the last 30 days? ---Not Applicable Does the patient have any new or worsening symptoms? ---Yes Will a triage be completed? ---Yes Related visit to physician within the last 2 weeks? ---Yes Does the PT have any chronic conditions? (i.e. diabetes, asthma, etc.) ---Yes List chronic conditions. ---stroke hx, on BP rx., CABG 5 years ago Guidelines Guideline Title Affirmed Question Affirmed Notes Low Blood Pressure [1] Fall in systolic BP > 20 mm Hg from normal AND [2] dizzy, lightheaded, or weak Final Disposition User Go to ED Now (or PCP triage) Marcelline Deist, RN, Lynda Comments The patient's wife states the patient just had PT today, so won't come in to an UC or ER, would like an appt. with his Dr. tomorrow. She mentioned that the patient has had a popping in the chest every time he moves ever since his CABG surgery. Now, she is noticiing a lump thicker than a marble on the left side of his chest beside the incision. Nurse will contact office & let  them know that pt. won't be seen in an UC/ER today, wants appt. Caller indicated she would like to get an appt. for the patient after 10 am tomorrow if possible. Spoke with office/Rena re: refusal of ER outcome. Referrals GO TO FACILITY UNDECIDED

## 2014-12-20 NOTE — Progress Notes (Signed)
ANTIBIOTIC CONSULT NOTE - INITIAL  Pharmacy Consult for vancomycin and ceftazidime Indication: rule out pneumonia/sepsis  Allergies  Allergen Reactions  . Diphenhydramine Hcl Anaphylaxis    Patient Measurements:   Adjusted Body Weight: 82.8 kg Ideal Body Weight: 77.6 kg  Vital Signs: Temp: 99 F (37.2 C) (10/04 1403) Temp Source: Rectal (10/04 1403) BP: 95/73 mmHg (10/04 1330) Pulse Rate: 109 (10/04 1312) Intake/Output from previous day:   Intake/Output from this shift:    Labs:  Recent Labs  12/20/14 1300  WBC 10.1  HGB 17.6*  PLT 189  CREATININE 1.62*   Estimated Creatinine Clearance: 49.9 mL/min (by C-G formula based on Cr of 1.62). No results for input(s): VANCOTROUGH, VANCOPEAK, VANCORANDOM, GENTTROUGH, GENTPEAK, GENTRANDOM, TOBRATROUGH, TOBRAPEAK, TOBRARND, AMIKACINPEAK, AMIKACINTROU, AMIKACIN in the last 72 hours.   Microbiology: Recent Results (from the past 720 hour(s))  Blood Culture (routine x 2)     Status: None (Preliminary result)   Collection Time: 12/20/14  1:30 PM  Result Value Ref Range Status   Specimen Description BLOOD RIGHT HAND  Final   Special Requests BOTTLES DRAWN AEROBIC ONLY 5ML  Final   Culture PENDING  Incomplete   Report Status PENDING  Incomplete    Medical History: Past Medical History  Diagnosis Date  . Coronary artery disease     stent, then CABG 07/20/10  . Hypertension   . Hyperlipidemia   . Obesity (BMI 30-39.9)   . H/O cardiomyopathy     ischemic, now last echo 07/30/11, EF 38%W  . Diverticulosis   . Internal hemorrhoids   . Tubular adenoma of colon   . Plantar fasciitis of left foot   . Basal cell carcinoma of skin of left ankle   . NSTEMI (non-ST elevated myocardial infarction) ([place])     NSTEMI- last cath 06/2011-Stent to LCX-DES, last nuc 07/30/11 low risk  . MI (myocardial infarction) ([place])     "I had 4 before I retired in 2006" (11/11/2014)  . Pulmonary embolism ([place]) 03/2014  . OSA on CPAP     since July 2013   . GERD (gastroesophageal reflux disease)   . Bleeding stomach ulcer 2015  . Daily headache     "for the past month" (11/11/2014)  . Stroke ([place]) 01/2014    "no control of LUE, can slightly LLE; memory problems since" (11/11/2014)  . Arthritis     "back" (11/11/2014)  . Gout   . Depression     Medications:   (Not in a hospital admission) Assessment: 72 yoM being initiated on empiric vancomycin and ceftazidime for potential HCAP/Code Sepsis (last admitted 11/11/14-11/16/14).   Febrile (99 F), WBC 10.1, LA 3.17, BP 95/73, CrCl ~50 mL/min  Goal of Therapy:  Vancomycin trough level 15-20 mcg/ml  Resolution of infection  Plan:  Vancomycin 1 g IV x1 Vancomycin 750 mg q12h Ceftazidime 2 g IV q8h (pt right at cut off for q12h dosing; monitor renal fxn) Expected duration 7 days with resolution of temperature and/or normalization of WBC Measure antibiotic drug levels at steady state Follow up culture results, renal fxn, LOT  Governor Specking, PharmD Clinical Pharmacy Resident Pager: [phone] 12/20/2014,2:08 PM

## 2014-12-20 NOTE — Telephone Encounter (Signed)
Spoke with Mr and Mrs Statz (on speaker phone)pt has felt dizzy since CVA 10 months ago; tunnel vision started 2 days ago and pt does feel like he is going to pass out. Today BP 80/71 and pts cuff 90/71. Pt's wife reports pt has lost 140 lbs since CVA. No CP,SOB or H/A now. Pt had heart bypass 5 years ago. Pt has already taken carvedilol this AM; advised not to take any more carvedilol until seen and advised what to do about continuing BP med. Mrs Snelson is going to take pt to [facility] ED now; lives in [place] will take 45 mins to get to hospital;if unable to go by car pts wife will call 911. called [place] spoke with [facility] [place] nurse  in Triage.

## 2014-12-20 NOTE — ED Notes (Signed)
Patient also noted to be clammy during triage. Wife states that patient has been complaining of being hot, this is not usual. Unable to obtain temp in triage.

## 2014-12-20 NOTE — ED Provider Notes (Signed)
CSN: [id number]     Arrival date & time 12/20/14  1238 History   First MD Initiated Contact with Patient 12/20/14 1250     Chief Complaint  Patient presents with  . Hypotension  . 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 Patient then subsequently experienced a bilateral submassive PE was placed on Xarelto -- presents today with several weeks to months of progressive weakness. Over the past 1 week patient has had worsening lightheadedness and dizziness, especially with standing, tunnel vision. Patient states that he really has not felt well since his stroke. Patient's physical therapist came to his home this morning and had blood pressure in the 29J systolic. He was pale and clammy so he was sent to the emergency department. Patient currently denies chest pain or shortness of breath he has had an ongoing cough which his wife states is worse than normal. He denies abdominal pain but has not had a bowel movement in 2 weeks. He has left hemiparesis at baseline and reports no new focal findings on the right side. He denies fevers, chills, urine symptoms, rash.  The history is provided by the patient.    Past Medical History  Diagnosis Date  . Coronary artery disease     stent, then CABG 07/20/10  . Hypertension   . Hyperlipidemia   . Obesity (BMI 30-39.9)   . H/O cardiomyopathy     ischemic, now last echo 07/30/11, EF 38%W  . Diverticulosis   . Internal hemorrhoids   . Tubular adenoma of colon   . Plantar fasciitis of left foot   . Basal cell carcinoma of skin of left ankle   . NSTEMI (non-ST elevated myocardial infarction) ([place])     NSTEMI- last cath 06/2011-Stent to LCX-DES, last nuc 07/30/11 low risk  . MI (myocardial infarction) ([place])      "I had 4 before I retired in 2006" (11/11/2014)  . Pulmonary embolism ([place]) 03/2014  . OSA on CPAP     since July 2013  . GERD (gastroesophageal reflux disease)   . Bleeding stomach ulcer 2015  . Daily headache     "for the past month" (11/11/2014)  . Stroke ([place]) 01/2014    "no control of LUE, can slightly LLE; memory problems since" (11/11/2014)  . Arthritis     "back" (11/11/2014)  . Gout   . Depression    Past Surgical History  Procedure Laterality Date  . Coronary artery bypass graft  07/20/2010     LIMA-LAD; VG-ACUTE MARG of RCA; Seq VG-distal RCA & then pda  . Coronary angioplasty with stent placement  07/01/2011    DES-Resolute to native LCX  . Coronary angioplasty with stent placement  12/01/2007    COMPLEX 5 LESION PCI INCLUDING CUTTING BALLOON AND 4 CYPHER DESs  . Knee arthroscopy Right   . Hand surgery Right     to take glass out  . Colonoscopy N/A 02/10/2013    Procedure: COLONOSCOPY;  Surgeon: Ladene Artist, MD;  Location: [facility];  Service: Endoscopy;  Laterality: N/A;  . Retinal detachment surgery Right   . Loop recorder implant  02/15/2014    MDT LINQ implanted by Dr Rayann Heman for cryptogenic stroke  . Tee without cardioversion N/A 02/15/2014    Procedure: TRANSESOPHAGEAL ECHOCARDIOGRAM (TEE);  Surgeon: Josue Hector, MD;  Location: [facility];  Service: Cardiovascular;  Laterality: N/A;  . Left heart catheterization with coronary/graft angiogram  07/01/2011    Procedure: LEFT HEART CATHETERIZATION WITH Beatrix Fetters;  Surgeon: Sanda Klein, MD;  Location: [facility] CATH LAB;  Service: Cardiovascular;;  . Percutaneous coronary stent intervention (pci-s) Right 07/01/2011    Procedure: PERCUTANEOUS CORONARY STENT INTERVENTION (PCI-S);  Surgeon: Sanda Klein, MD;  Location: [facility] CATH LAB;  Service: Cardiovascular;  Laterality: Right;  . Umbilical hernia repair  2006  . Hernia repair    . Basal cell carcinoma excision Left 2015    ankle  .  Esophagogastroduodenoscopy  01/2014    gastric ulcer, erosive gastroduodenitis, H Pylori negative.   . Esophagogastroduodenoscopy (egd) with propofol N/A 11/14/2014    Procedure: ESOPHAGOGASTRODUODENOSCOPY (EGD) WITH PROPOFOL;  Surgeon: Milus Banister, MD;  Location: [facility];  Service: Endoscopy;  Laterality: N/A;   Family History  Problem Relation Age of Onset  . Diabetes type II Mother   . Coronary artery disease    . Colon cancer Neg Hx   . CAD Father 69    CABG, PCI, died at 74  . Heart attack Father 76  . Hypertension Brother   . Diabetes Brother   . Hyperlipidemia Brother   . Brain cancer Maternal Grandmother   . COPD Paternal Grandfather   . Other      denies family history of DVT/PE  . Stroke Neg Hx    Social History  Substance Use Topics  . Smoking status: Former Smoker -- 2.00 packs/day for 4 years    Types: Cigarettes  . Smokeless tobacco: Never Used     Comment: "quit smoking cigarettes  in the 1970's"  . Alcohol Use: 0.0 oz/week    0 Standard drinks or equivalent per week     Comment: 11/11/2014 "1-2 times per year"    Review of Systems  Constitutional: Positive for fatigue. Negative for fever.  HENT: Negative for rhinorrhea and sore throat.   Eyes: Negative for redness.  Respiratory: Positive for cough. Negative for shortness of breath.   Cardiovascular: Negative for chest pain and leg swelling.  Gastrointestinal: Positive for nausea. Negative for vomiting, abdominal pain and diarrhea.  Genitourinary: Negative for dysuria and frequency.  Musculoskeletal: Negative for myalgias.  Skin: Positive for pallor. Negative for rash.  Neurological: Positive for dizziness, weakness (baseline, unchanged) and light-headedness. Negative for syncope, facial asymmetry, speech difficulty, numbness and headaches.      Allergies  Diphenhydramine hcl  Home Medications   Prior to Admission medications   Medication Sig Start Date End Date Taking? Authorizing Provider   acetaminophen (TYLENOL) 325 MG tablet Take 2 tablets (650 mg total) by mouth every 6 (six) hours as needed for mild pain. 03/15/14   Bary Leriche, PA-C  ALPRAZolam Duanne Moron) 0.25 MG tablet Take 0.5 tablets (0.125 mg total) by mouth every 8 (eight) hours as needed for anxiety. 04/14/14   Tiffany L Reed, DO  carvedilol (COREG) 6.25 MG tablet Take 0.5 tablets (3.125 mg total) by mouth 2 (two) times daily. 12/02/14   [name], MD  colchicine 0.6 MG tablet Take 0.6 mg by mouth as needed (for gout).  04/09/14   Historical Provider, MD  feeding supplement, ENSURE ENLIVE, (ENSURE ENLIVE) LIQD Take 237 mLs by mouth 2 (two) times daily between meals. 11/16/14   Shanker Kristeen Mans, MD  fluticasone (FLONASE) 50 MCG/ACT nasal spray Place 2 sprays into both nostrils daily. 03/15/14   Bary Leriche, PA-C  HYDROcodone-acetaminophen (NORCO/VICODIN) 5-325 MG per  tablet Take one tablet up to twice daily as needed for headache. 12/05/14   Britt Bottom, MD  hydrocortisone (ANUSOL-HC) 25 MG suppository Place 1 suppository (25 mg total) rectally daily. X 1 week; then, use as needed thereafter. 07/26/14   Jerene Bears, MD  imipramine (TOFRANIL) 25 MG tablet Take 1 tablet (25 mg total) by mouth at bedtime. 11/30/14   Britt Bottom, MD  meclizine (ANTIVERT) 25 MG tablet Take 1 tablet (25 mg total) by mouth 3 (three) times daily as needed for dizziness. Patient taking differently: Take 25 mg by mouth 3 (three) times daily as needed for dizziness (bed-time).  03/15/14   Bary Leriche, PA-C  Melatonin 10 MG CAPS Take 1 capsule by mouth at bedtime.    Historical Provider, MD  methylphenidate (RITALIN) 10 MG tablet Take up to 3 times a day po 10/26/14   Britt Bottom, MD  mirtazapine (REMERON) 15 MG tablet Take 0.5 tablets (7.5 mg total) by mouth at bedtime. 12/15/14   Tonia Ghent, MD  nitroGLYCERIN (NITROSTAT) 0.4 MG SL tablet Place 0.4 mg under the tongue every 5 (five) minutes as needed. For chest pain    Historical  Provider, MD  NONFORMULARY OR COMPOUNDED ITEM Laminine.  supplement 12/13/14   Tonia Ghent, MD  ondansetron (ZOFRAN) 4 MG tablet Take 1 tablet (4 mg total) by mouth every 12 (twelve) hours. Call if prior approval is needed. 12/13/14   Tonia Ghent, MD  OVER THE COUNTER MEDICATION Inhale 1 application into the lungs at bedtime. C-PAP    Historical Provider, MD  pantoprazole (PROTONIX) 40 MG tablet TAKE 1 TABLET BY MOUTH 2 TIMES A DAY. 09/15/14   [name], MD  polyethylene glycol powder (GLYCOLAX/MIRALAX) powder Take 1 Container by mouth 3 (three) times daily.    Historical Provider, MD  promethazine (PHENERGAN) 12.5 MG tablet Take 1-2 tablets (12.5-25 mg total) by mouth every 6 (six) hours as needed for nausea or vomiting. 10/28/14   Pleas Koch, NP  rivaroxaban (XARELTO) 20 MG TABS tablet Take 1 tablet (20 mg total) by mouth daily with supper. 05/05/14   Britt Bottom, MD  rosuvastatin (CRESTOR) 40 MG tablet Take 1 tablet (40 mg total) by mouth daily. 09/21/14   [name], MD  Sennosides (SENOKOT PO) Take 2 tablets by mouth daily.    Historical Provider, MD  Vitamin D, Ergocalciferol, (DRISDOL) 50000 UNITS CAPS capsule Take 50,000 Units by mouth every 7 (seven) days. Take on Mondays    Historical Provider, MD   BP 89/31 mmHg  Pulse 133  Temp(Src)   Resp 18  SpO2 99% Physical Exam  Constitutional: He appears well-developed and well-nourished.  HENT:  Head: Normocephalic and atraumatic.  Mouth/Throat: Oropharynx is clear and moist.  Eyes: Conjunctivae are normal. Pupils are equal, round, and reactive to light. Right eye exhibits no discharge. Left eye exhibits no discharge.  Neck: Normal range of motion. Neck supple.  Cardiovascular: Normal rate, regular rhythm and normal heart sounds.   Pulses:      Radial pulses are 1+ on the right side, and 1+ on the left side.  Pulmonary/Chest: Effort normal and breath sounds normal.  Abdominal: Soft. There is no tenderness.   Neurological: He is alert.  Skin: Skin is warm. He is diaphoretic (clammy). There is pallor.  Psychiatric: He has a normal mood and affect.  Nursing note and vitals reviewed.   ED Course  Procedures (including critical care time)  Labs Review Labs Reviewed  COMPREHENSIVE METABOLIC PANEL - Abnormal; Notable for the following:    Sodium 134 (*)    Potassium 3.3 (*)    Chloride 95 (*)    Glucose, Bld 123 (*)    Creatinine, Ser 1.62 (*)    Albumin 3.4 (*)    AST 45 (*)    Total Bilirubin 1.3 (*)    GFR calc non Af Amer 43 (*)    GFR calc Af Amer 50 (*)    All other components within normal limits  CBC WITH DIFFERENTIAL/PLATELET - Abnormal; Notable for the following:    RBC 6.07 (*)    Hemoglobin 17.6 (*)    All other components within normal limits  I-STAT CG4 LACTIC ACID, ED - Abnormal; Notable for the following:    Lactic Acid, Venous 3.17 (*)    All other components within normal limits  CULTURE, BLOOD (ROUTINE X 2)  CULTURE, BLOOD (ROUTINE X 2)  URINE CULTURE  URINALYSIS, ROUTINE W REFLEX MICROSCOPIC (NOT AT [facility])  Randolm Idol, ED    Imaging Review Dg Chest Portable 1 View  12/20/2014   CLINICAL DATA:  Hypotension and code sepsis.  EXAM: PORTABLE CHEST 1 VIEW  COMPARISON:  11/11/2014  FINDINGS: Lung volumes are relatively low bilaterally with bibasilar atelectasis present. There may conceivably be a subtle infiltrate at the left lung base. No edema, pleural fluid, pneumothorax or nodule is identified. The heart size is normal status post prior CABG. There is stable appearance of an implanted loop recorder.  IMPRESSION: Low lung volumes with bibasilar atelectasis. Possible subtle infiltrate at the left lung base.   Electronically Signed   By: Aletta Edouard M.D.   On: 12/20/2014 13:43   I have personally reviewed and evaluated these images and lab results as part of my medical decision-making.   EKG Interpretation None       1:04 PM Patient seen and examined. He  has had progressive symptoms of illness. Normal mentation. No obvious source of infection yet, will proceed down sepsis pathway. Fluid resusitate. Work-up initiated. Medications ordered. Pending rectal temp.   Vital signs reviewed and are as follows: BP 89/31 mmHg  Pulse 133  Temp(Src)   Resp 18  SpO2 99%  1:36 PM EKG reviewed. T-wave inversion V2 more pronounced, pre-existing mild ST depression I, aVL unchanged from previous.   1:52 PM Elevated lactate. Possible LLL infiltrate. Abx ordered to cover for HCAP.   3:18 PM Spoke earlier with [facility] who will admit. Patient and family updated. BP/HR improving after 1.5L of fluids. Stepdown bed requested. No need for vasopressors at this time.   CRITICAL CARE Performed by: Faustino Congress Total critical care time: 45 Critical care time was exclusive of separately billable procedures and treating other patients. Critical care was necessary to treat or prevent imminent or life-threatening deterioration. Critical care was time spent personally by me on the following activities: development of treatment plan with patient and/or surrogate as well as nursing, discussions with consultants, evaluation of patient's response to treatment, examination of patient, obtaining history from patient or surrogate, ordering and performing treatments and interventions, ordering and review of laboratory studies, ordering and review of radiographic studies, pulse oximetry and re-evaluation of patient's condition.   BP 95/72 mmHg  Pulse 101  Temp(Src) 99 F (37.2 C) (Rectal)  Resp 25  SpO2 100%   MDM   Final diagnoses:  HCAP (healthcare-associated pneumonia)  Sepsis, due to unspecified organism ([place])   Admit.  Carlisle Cater, PA-C 12/20/14 [place], MD 12/21/14 2114

## 2014-12-20 NOTE — ED Notes (Signed)
Attempted report 

## 2014-12-20 NOTE — ED Notes (Addendum)
Patient with history of stroke 10 months ago affecting left side. Patient had PT this am and was not to have BP in the 80s. Patient reports increased dizziness, tunnel vision, and general feeling of not feeling well. Wife also reports 2 weeks history of constipation.

## 2014-12-20 NOTE — Telephone Encounter (Signed)
Noted. Thanks.

## 2014-12-20 NOTE — H&P (Signed)
[facility] History and Physical  Gregory Wiley:[id number] DOB: 1951-02-01 DOA: 12/20/2014  Referring physician: Rondel Oh PCP: [name], MD   Chief Complaint: hypotension and dizziness  HPI: Gregory Wiley is a 64 y.o. male  past medical history of hypertension, CAD status post CABG in 2012, chronic combined CHF dyslipidemia, CVA with residual left hemiparesis presents to the emergency department with the chief complaint of ongoing weakness dizziness worsening over the last 3 days. Initial evaluation in the emergency department reveals a chest x-ray concerning for healthcare associated pneumonia, acute kidney injury, hypotension, tachycardia. Patient reports persistent generalized weakness and dizziness particularly with position change over the last several weeks. He states worsening over the last 3 days. Associated symptoms include decreased appetite persistent nausea no vomiting constipation, worsening shortness of breath with exertion dry cough and worsening peripheral vision. He denies fever chills chest pain palpitations. He denies headache abdominal pain dysuria hematuria frequency or urgency. He does report 140 pound weight loss over the last 10 months due to decreased appetite. Workup in the emergency department reveals sodium of 134 potassium of 3.3 chloride 95 creatinine 1.6 AST 45. Lactic acid 3.17. Initial troponin negative. Chest x-ray low lung volumes with bibasilar atelectasis. Possible subtle infiltrate at the left lung base. Patient unable to give urine specimen at time of my exam. On presentation patient was hypotensive with a systolic blood pressure in the low 80s and a heart rate of 120.Received fluid resuscitation. Time my exam his blood pressure 95/72 heart rate 101. In addition 4 to has and vancomycin initiated.  Review of Systems:  10 point review of systems complete and all systems are negative except as indicated in the history of present illness Past Medical  History  Diagnosis Date  . Coronary artery disease     stent, then CABG 07/20/10  . Hypertension   . Hyperlipidemia   . Obesity (BMI 30-39.9)   . H/O cardiomyopathy     ischemic, now last echo 07/30/11, EF 38%W  . Diverticulosis   . Internal hemorrhoids   . Tubular adenoma of colon   . Plantar fasciitis of left foot   . Basal cell carcinoma of skin of left ankle   . NSTEMI (non-ST elevated myocardial infarction) ([place])     NSTEMI- last cath 06/2011-Stent to LCX-DES, last nuc 07/30/11 low risk  . MI (myocardial infarction) ([place])     "I had 4 before I retired in 2006" (11/11/2014)  . Pulmonary embolism ([place]) 03/2014  . OSA on CPAP     since July 2013  . GERD (gastroesophageal reflux disease)   . Bleeding stomach ulcer 2015  . Daily headache     "for the past month" (11/11/2014)  . Stroke ([place]) 01/2014    "no control of LUE, can slightly LLE; memory problems since" (11/11/2014)  . Arthritis     "back" (11/11/2014)  . Gout   . Depression    Past Surgical History  Procedure Laterality Date  . Coronary artery bypass graft  07/20/2010     LIMA-LAD; VG-ACUTE MARG of RCA; Seq VG-distal RCA & then pda  . Coronary angioplasty with stent placement  07/01/2011    DES-Resolute to native LCX  . Coronary angioplasty with stent placement  12/01/2007    COMPLEX 5 LESION PCI INCLUDING CUTTING BALLOON AND 4 CYPHER DESs  . Knee arthroscopy Right   . Hand surgery Right     to take glass out  . Colonoscopy N/A 02/10/2013    Procedure: COLONOSCOPY;  Surgeon: Ladene Artist, MD;  Location: [facility];  Service: Endoscopy;  Laterality: N/A;  . Retinal detachment surgery Right   . Loop recorder implant  02/15/2014    MDT LINQ implanted by Dr Rayann Heman for cryptogenic stroke  . Tee without cardioversion N/A 02/15/2014    Procedure: TRANSESOPHAGEAL ECHOCARDIOGRAM (TEE);  Surgeon: Josue Hector, MD;  Location: [facility] ENDOSCOPY;  Service: Cardiovascular;  Laterality: N/A;  . Left heart catheterization with  coronary/graft angiogram  07/01/2011    Procedure: LEFT HEART CATHETERIZATION WITH Beatrix Fetters;  Surgeon: Sanda Klein, MD;  Location: [facility] CATH LAB;  Service: Cardiovascular;;  . Percutaneous coronary stent intervention (pci-s) Right 07/01/2011    Procedure: PERCUTANEOUS CORONARY STENT INTERVENTION (PCI-S);  Surgeon: Sanda Klein, MD;  Location: [facility] CATH LAB;  Service: Cardiovascular;  Laterality: Right;  . Umbilical hernia repair  2006  . Hernia repair    . Basal cell carcinoma excision Left 2015    ankle  . Esophagogastroduodenoscopy  01/2014    gastric ulcer, erosive gastroduodenitis, H Pylori negative.   . Esophagogastroduodenoscopy (egd) with propofol N/A 11/14/2014    Procedure: ESOPHAGOGASTRODUODENOSCOPY (EGD) WITH PROPOFOL;  Surgeon: Milus Banister, MD;  Location: [place];  Service: Endoscopy;  Laterality: N/A;   Social History:  reports that he has quit smoking. His smoking use included Cigarettes. He has a 8 pack-year smoking history. He has never used smokeless tobacco. He reports that he drinks alcohol. He reports that he uses illicit drugs (Marijuana).  Allergies  Allergen Reactions  . Diphenhydramine Hcl Anaphylaxis    Family History  Problem Relation Age of Onset  . Diabetes type II Mother   . Coronary artery disease    . Colon cancer Neg Hx   . CAD Father 66    CABG, PCI, died at 76  . Heart attack Father 36  . Hypertension Brother   . Diabetes Brother   . Hyperlipidemia Brother   . Brain cancer Maternal Grandmother   . COPD Paternal Grandfather   . Other      denies family history of DVT/PE  . Stroke Neg Hx      Prior to Admission medications   Medication Sig Start Date End Date Taking? Authorizing Provider  acetaminophen (TYLENOL) 325 MG tablet Take 2 tablets (650 mg total) by mouth every 6 (six) hours as needed for mild pain. 03/15/14   Bary Leriche, PA-C  ALPRAZolam Duanne Moron) 0.25 MG tablet Take 0.5 tablets (0.125 mg total) by mouth every  8 (eight) hours as needed for anxiety. 04/14/14   Tiffany L Reed, DO  carvedilol (COREG) 6.25 MG tablet Take 0.5 tablets (3.125 mg total) by mouth 2 (two) times daily. 12/02/14   [name], MD  colchicine 0.6 MG tablet Take 0.6 mg by mouth as needed (for gout).  04/09/14   Historical Provider, MD  feeding supplement, ENSURE ENLIVE, (ENSURE ENLIVE) LIQD Take 237 mLs by mouth 2 (two) times daily between meals. 11/16/14   Shanker Kristeen Mans, MD  fluticasone (FLONASE) 50 MCG/ACT nasal spray Place 2 sprays into both nostrils daily. 03/15/14   Bary Leriche, PA-C  HYDROcodone-acetaminophen (NORCO/VICODIN) 5-325 MG per tablet Take one tablet up to twice daily as needed for headache. 12/05/14   Britt Bottom, MD  hydrocortisone (ANUSOL-HC) 25 MG suppository Place 1 suppository (25 mg total) rectally daily. X 1 week; then, use as needed thereafter. 07/26/14   Jerene Bears, MD  imipramine (TOFRANIL) 25 MG tablet Take 1 tablet (25 mg  total) by mouth at bedtime. 11/30/14   Britt Bottom, MD  meclizine (ANTIVERT) 25 MG tablet Take 1 tablet (25 mg total) by mouth 3 (three) times daily as needed for dizziness. Patient taking differently: Take 25 mg by mouth 3 (three) times daily as needed for dizziness (bed-time).  03/15/14   Bary Leriche, PA-C  Melatonin 10 MG CAPS Take 1 capsule by mouth at bedtime.    Historical Provider, MD  methylphenidate (RITALIN) 10 MG tablet Take up to 3 times a day po 10/26/14   Britt Bottom, MD  mirtazapine (REMERON) 15 MG tablet Take 0.5 tablets (7.5 mg total) by mouth at bedtime. 12/15/14   Tonia Ghent, MD  nitroGLYCERIN (NITROSTAT) 0.4 MG SL tablet Place 0.4 mg under the tongue every 5 (five) minutes as needed. For chest pain    Historical Provider, MD  NONFORMULARY OR COMPOUNDED ITEM Laminine.  supplement 12/13/14   Tonia Ghent, MD  ondansetron (ZOFRAN) 4 MG tablet Take 1 tablet (4 mg total) by mouth every 12 (twelve) hours. Call if prior approval is needed. 12/13/14   Tonia Ghent, MD  OVER THE COUNTER MEDICATION Inhale 1 application into the lungs at bedtime. C-PAP    Historical Provider, MD  pantoprazole (PROTONIX) 40 MG tablet TAKE 1 TABLET BY MOUTH 2 TIMES A DAY. 09/15/14   [name], MD  polyethylene glycol powder (GLYCOLAX/MIRALAX) powder Take 1 Container by mouth 3 (three) times daily.    Historical Provider, MD  promethazine (PHENERGAN) 12.5 MG tablet Take 1-2 tablets (12.5-25 mg total) by mouth every 6 (six) hours as needed for nausea or vomiting. 10/28/14   Pleas Koch, NP  rivaroxaban (XARELTO) 20 MG TABS tablet Take 1 tablet (20 mg total) by mouth daily with supper. 05/05/14   Britt Bottom, MD  rosuvastatin (CRESTOR) 40 MG tablet Take 1 tablet (40 mg total) by mouth daily. 09/21/14   [name], MD  Sennosides (SENOKOT PO) Take 2 tablets by mouth daily.    Historical Provider, MD  Vitamin D, Ergocalciferol, (DRISDOL) 50000 UNITS CAPS capsule Take 50,000 Units by mouth every 7 (seven) days. Take on Mondays    Historical Provider, MD   Physical Exam: Filed Vitals:   12/20/14 1403 12/20/14 1415 12/20/14 1430 12/20/14 1445  BP:  90/62 103/67 95/72  Pulse:  104 98 101  Temp: 99 F (37.2 C)     TempSrc: Rectal     Resp:  23 21 25   SpO2:  100% 99% 100%    Wt Readings from Last 3 Encounters:  12/13/14 90.719 kg (200 lb)  12/02/14 95.709 kg (211 lb)  11/30/14 102.513 kg (226 lb)    General:  Appears calm and comfortable somewhat unkept Eyes: PERRL, normal lids, irises & conjunctiva ENT: grossly normal hearing, dismembered of his mouth slightly pale dry Neck: no LAD, masses or thyromegaly Cardiovascular: RRR, no m/r/g. No LE edema. Telemetry: SR, no arrhythmias  Respiratory: CTA bilaterally, no w/r/r. Normal respiratory effort. Abdomen: soft, ntnd sluggish bowel sounds Skin: no rash or induration seen on limited exam Musculoskeletal: grossly normal tone BUE/BLE Psychiatric: grossly normal mood and affect, speech fluent and  appropriate Neurologic: grossly non-focal. speech clear facial symmetry left arm and leg with little movement. Unable to grip.           Labs on Admission:  Basic Metabolic Panel:  Recent Labs Lab 12/20/14 1300  NA 134*  K 3.3*  CL 95*  CO2 25  GLUCOSE 123*  BUN 14  CREATININE 1.62*  CALCIUM 10.1   Liver Function Tests:  Recent Labs Lab 12/20/14 1300  AST 45*  ALT 43  ALKPHOS 97  BILITOT 1.3*  PROT 7.8  ALBUMIN 3.4*   No results for input(s): LIPASE, AMYLASE in the last 168 hours. No results for input(s): AMMONIA in the last 168 hours. CBC:  Recent Labs Lab 12/20/14 1300  WBC 10.1  NEUTROABS 6.8  HGB 17.6*  HCT 51.7  MCV 85.2  PLT 189   Cardiac Enzymes: No results for input(s): CKTOTAL, CKMB, CKMBINDEX, TROPONINI in the last 168 hours.  BNP (last 3 results)  Recent Labs  04/11/14 2310  BNP 103.3*    ProBNP (last 3 results) No results for input(s): PROBNP in the last 8760 hours.  CBG: No results for input(s): GLUCAP in the last 168 hours.  Radiological Exams on Admission: Dg Chest Portable 1 View  12/20/2014   CLINICAL DATA:  Hypotension and code sepsis.  EXAM: PORTABLE CHEST 1 VIEW  COMPARISON:  11/11/2014  FINDINGS: Lung volumes are relatively low bilaterally with bibasilar atelectasis present. There may conceivably be a subtle infiltrate at the left lung base. No edema, pleural fluid, pneumothorax or nodule is identified. The heart size is normal status post prior CABG. There is stable appearance of an implanted loop recorder.  IMPRESSION: Low lung volumes with bibasilar atelectasis. Possible subtle infiltrate at the left lung base.   Electronically Signed   By: Aletta Edouard M.D.   On: 12/20/2014 13:43    EKG: Independently reviewed. Sinus tachycardia Left anterior fascicular block Anterior infarct, old Borderline repolarization abnormality  Assessment/Plan Principal Problem:   HCAP (healthcare-associated pneumonia): Per chest x-ray.  Rectal temp 99 elevated lactic acid as noted above no leukocytosis. Will get blood cultures obtain strep pneumo urine antigen as well as Legionella urine antigen. Vancomycin and Fortaz per pharmacy. Monitor and step down. Oxygen supplementation as indicated. Active Problems: Early sepsis. Likely related to #1 but I am awaiting urinalysis for evaluation as well. Continue IV fluids initiated in the emergency department. Obtained serial lactic acids. Antibiotic as noted above. Blood cultures 2. Hold home anti-hypertensive medications  Acute renal failure ([place]): Likely related to above as well as decreased oral intake. IV resuscitation as noted above will hold any nephrotoxins. Monitor urine output. If no improvement consider renal ultrasound  Worsening tunnel vision : Patient states his generalized weakness and dizziness is fairly close to baseline worsening tunnel vision and peripheral vision due to. Will obtain a CT of his head.  Dehydration: She has had a 140 pound weight loss in the last 10 months in addition to worsening appetite last 3 days. IV fluids as noted above. Monitor    Hypokalemia: Mild. Related to above. Will replete and recheck. Will obtain a magnesium level    Chronic combined systolic and diastolic congestive heart failure (HCC: Echocardiogram done in June of this year revealsThere was mildconcentric hypertrophy. Systolic function was mildly tomoderately reduced. The estimated ejection fraction was in the range of 40% to 45%. Akinesis and scarring of themid-apicalanteroseptal and apical myocardium. Doppler parameters are consistent with abnormal left ventricular relaxation (grade 1diastolic dysfunction). Fluids as above.    CAD, RCA PCI '09, 10/11, CABG X 4 5/12: Denies chest pain. 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 Will request PT      Bilateral pulmonary embolism (HCC)on xaerlto      Chronic  constipation: scheduled  stool softeners     Code Status: full DVT Prophylaxis: Family Communication: wife at bedside Disposition Plan: home 2-3 days  Time spent: 24 minuters  [facility]

## 2014-12-20 NOTE — ED Notes (Signed)
PA and RN made aware of patients vitals, staff at bedside

## 2014-12-21 ENCOUNTER — Inpatient Hospital Stay (HOSPITAL_COMMUNITY): Payer: Medicare Other

## 2014-12-21 DIAGNOSIS — I63312 Cerebral infarction due to thrombosis of left middle cerebral artery: Secondary | ICD-10-CM

## 2014-12-21 DIAGNOSIS — A419 Sepsis, unspecified organism: Secondary | ICD-10-CM | POA: Diagnosis not present

## 2014-12-21 LAB — URINE CULTURE: Culture: NO GROWTH

## 2014-12-21 LAB — CBC
HCT: 40.7 % (ref 39.0–52.0)
Hemoglobin: 14.1 g/dL (ref 13.0–17.0)
MCH: 29.2 pg (ref 26.0–34.0)
MCHC: 34.6 g/dL (ref 30.0–36.0)
MCV: 84.3 fL (ref 78.0–100.0)
Platelets: 115 10*3/uL — ABNORMAL LOW (ref 150–400)
RBC: 4.83 MIL/uL (ref 4.22–5.81)
RDW: 15.5 % (ref 11.5–15.5)
WBC: 5.8 10*3/uL (ref 4.0–10.5)

## 2014-12-21 LAB — BASIC METABOLIC PANEL
Anion gap: 7 (ref 5–15)
BUN: 10 mg/dL (ref 6–20)
CO2: 19 mmol/L — ABNORMAL LOW (ref 22–32)
Calcium: 8.2 mg/dL — ABNORMAL LOW (ref 8.9–10.3)
Chloride: 107 mmol/L (ref 101–111)
Creatinine, Ser: 1.12 mg/dL (ref 0.61–1.24)
GFR calc Af Amer: >60 mL/min (ref >60)
GFR calc non Af Amer: >60 mL/min (ref >60)
Glucose, Bld: 110 mg/dL — ABNORMAL HIGH (ref 65–99)
Potassium: 3 mmol/L — ABNORMAL LOW (ref 3.5–5.1)
Sodium: 133 mmol/L — ABNORMAL LOW (ref 135–145)

## 2014-12-21 LAB — LEGIONELLA PNEUMOPHILA SEROGP 1 UR AG: L. pneumophila Serogp 1 Ur Ag: NEGATIVE

## 2014-12-21 LAB — HIV ANTIBODY (ROUTINE TESTING W REFLEX): HIV Screen 4th Generation wRfx: NONREACTIVE

## 2014-12-21 LAB — LACTIC ACID, PLASMA: Lactic Acid, Venous: 1.2 mmol/L (ref 0.5–2.0)

## 2014-12-21 MED ORDER — POTASSIUM CHLORIDE CRYS ER 20 MEQ PO TBCR
40.0000 meq | EXTENDED_RELEASE_TABLET | Freq: Two times a day (BID) | ORAL | Status: AC
  Administered 2014-12-21 – 2014-12-22 (×3): 40 meq via ORAL
  Filled 2014-12-21 (×3): qty 2

## 2014-12-21 MED ORDER — PROMETHAZINE HCL 25 MG PO TABS
12.5000 mg | ORAL_TABLET | Freq: Four times a day (QID) | ORAL | Status: DC | PRN
Start: 2014-12-21 — End: 2014-12-27

## 2014-12-21 MED ORDER — VANCOMYCIN HCL IN DEXTROSE 1-5 GM/200ML-% IV SOLN
1000.0000 mg | Freq: Two times a day (BID) | INTRAVENOUS | Status: DC
  Administered 2014-12-21: 1000 mg via INTRAVENOUS
  Filled 2014-12-21 (×2): qty 200

## 2014-12-21 MED ORDER — SODIUM CHLORIDE 0.9 % IV SOLN: INTRAVENOUS | Status: DC

## 2014-12-21 MED ORDER — SODIUM CHLORIDE 0.9 % IV BOLUS (SEPSIS)
500.0000 mL | Freq: Once | INTRAVENOUS | Status: AC
  Administered 2014-12-21: 500 mL via INTRAVENOUS

## 2014-12-21 MED ORDER — POLYETHYLENE GLYCOL 3350 17 G PO PACK
17.0000 g | PACK | Freq: Two times a day (BID) | ORAL | Status: DC
  Administered 2014-12-21 – 2014-12-25 (×8): 17 g via ORAL
  Filled 2014-12-21 (×9): qty 1

## 2014-12-21 NOTE — Progress Notes (Signed)
Patient placed on CPAP for the night and is tolerating well at this time. RT will continue to monitor.

## 2014-12-21 NOTE — Telephone Encounter (Signed)
Paperwork faxed to [facility] for PA.

## 2014-12-21 NOTE — Progress Notes (Signed)
Initial Nutrition Assessment  DOCUMENTATION CODES:   Severe malnutrition in context of chronic illness  INTERVENTION:   No nutrition intervention at this time --- patient declined  NUTRITION DIAGNOSIS:   Inadequate oral intake related to poor appetite as evidenced by per patient/family report  GOAL:   Patient will meet greater than or equal to 90% of their needs  MONITOR:   PO intake, Labs, Weight trends, I & O's  REASON FOR ASSESSMENT:   Malnutrition Screening Tool  ASSESSMENT:   64 yo Male with a history of hypertension, CAD, recent CVA 10 months ago presented to ER because of lightheadedness and orthostatic hypotension requiring fluid resuscitation with 3 L of fluid, patient also endorses decreased appetite, nausea, constipation; admitted with SIRS and HCAP.  RD spoke with pt's wife at bedside.  Reports pt has had a decreased appetite since his stroke 10 months ago.  Pt consuming 1 meal per day.  Wife says he will "crave" certain foods then only take bites.  Also reports he's had unintentional weight loss of 140 lbs during this time (severe for time frame).  Per weight readings, pt has had a 56% weight loss since December 2015.  Pt declined addition of oral nutrition supplements.  RD unable to complete Nutrition Focused Physical Exam at this time.  Diet Order:  Diet regular Room service appropriate?: Yes; Fluid consistency:: Thin  Skin:  Reviewed, no issues  Last BM:  Unknown  Height:   Ht Readings from Last 1 Encounters:  12/20/14 6' (1.829 m)    Weight:   Wt Readings from Last 1 Encounters:  12/20/14 203 lb 9.6 oz (92.352 kg)    Wt Readings from Last 20 Encounters:  12/20/14 203 lb 9.6 oz (92.352 kg)  12/13/14 200 lb (90.719 kg)  12/02/14 211 lb (95.709 kg)  11/30/14 226 lb (102.513 kg)  11/18/14 226 lb (102.513 kg)  11/15/14 227 lb 1.2 oz (103 kg)  11/11/14 219 lb (99.338 kg)  10/28/14 229 lb (103.874 kg)  10/04/14 241 lb 4 oz (109.43 kg)  09/09/14  251 lb 9.6 oz (114.125 kg)  08/18/14 257 lb (116.574 kg)  07/26/14 266 lb 3.2 oz (120.748 kg)  06/17/14 280 lb 9.6 oz (127.279 kg)  05/12/14 297 lb (134.718 kg)  05/03/14 300 lb 6.4 oz (136.261 kg)  04/13/14 303 lb 11.2 oz (137.757 kg)  03/10/14 317 lb 11.2 oz (144.108 kg)  02/12/14 350 lb (158.759 kg)  02/02/14 347 lb (157.398 kg)  02/01/14 347 lb (157.398 kg)    Ideal Body Weight:  81 kg  BMI:  Body mass index is 27.61 kg/(m^2).  Estimated Nutritional Needs:   Kcal:  2100-2300  Protein:  105-115 gm  Fluid:  2.1-2.3 L  EDUCATION NEEDS:   No education needs identified at this time  Arthur Holms, RD, LDN Pager #: [phone] After-Hours Pager #: [phone]

## 2014-12-21 NOTE — Progress Notes (Signed)
Utilization Review Completed.Donne Anon T10/07/2014

## 2014-12-21 NOTE — Consult Note (Signed)
Referring Physician: Mcclung    Chief Complaint: code stroke  HPI:                                                                                                                                         Gregory Wiley is an 64 y.o. male withHTN, hyperlipidemia, CAD s/p stenting and CABG, MI, ischemic cardiomyopathy, previous stroke resulting in left facial droop and left hemiplegia, . Patient is currently on Xeralto for PE. He is admitted for weakness, dizziness, hypotension and possible PNA.  Today while eating lunch he complained of right upper lip numbness, right sided weakness and  dysarthria. Code stroke was called.  CT head obtained showed old right temporal parietal infarct but no new infarct or bleed.  He is not a tPA candidate to being Xeralto and receiving last dose last night. Symptoms at this point are minimal and he would not be a IR candidate. Currently his BP is 99/70 and receiving fluid bolus.   Date last known well: Date: 12/21/2014 Time last known well: Time: 11:00 tPA Given: No: on Xeralto     Past Medical History  Diagnosis Date  . Coronary artery disease     stent, then CABG 07/20/10  . Hypertension   . Hyperlipidemia   . Obesity (BMI 30-39.9)   . H/O cardiomyopathy     ischemic, now last echo 07/30/11, EF 38%W  . Diverticulosis   . Internal hemorrhoids   . Tubular adenoma of colon   . Plantar fasciitis of left foot   . Basal cell carcinoma of skin of left ankle   . NSTEMI (non-ST elevated myocardial infarction) ([place])     NSTEMI- last cath 06/2011-Stent to LCX-DES, last nuc 07/30/11 low risk  . MI (myocardial infarction) ([place])     "I had 4 before I retired in 2006" (11/11/2014)  . Pulmonary embolism ([place]) 03/2014  . OSA on CPAP     since July 2013  . GERD (gastroesophageal reflux disease)   . Bleeding stomach ulcer 2015  . Daily headache     "for the past month" (11/11/2014)  . Stroke ([place]) 01/2014    "no control of LUE, can slightly LLE; memory problems since"  (11/11/2014)  . Arthritis     "back" (11/11/2014)  . Gout   . Depression     Past Surgical History  Procedure Laterality Date  . Coronary artery bypass graft  07/20/2010     LIMA-LAD; VG-ACUTE MARG of RCA; Seq VG-distal RCA & then pda  . Coronary angioplasty with stent placement  07/01/2011    DES-Resolute to native LCX  . Coronary angioplasty with stent placement  12/01/2007    COMPLEX 5 LESION PCI INCLUDING CUTTING BALLOON AND 4 CYPHER DESs  . Knee arthroscopy Right   . Hand surgery Right     to take glass out  . Colonoscopy N/A 02/10/2013    Procedure:  COLONOSCOPY;  Surgeon: Ladene Artist, MD;  Location: [facility];  Service: Endoscopy;  Laterality: N/A;  . Retinal detachment surgery Right   . Loop recorder implant  02/15/2014    MDT LINQ implanted by Dr Rayann Heman for cryptogenic stroke  . Tee without cardioversion N/A 02/15/2014    Procedure: TRANSESOPHAGEAL ECHOCARDIOGRAM (TEE);  Surgeon: Josue Hector, MD;  Location: [facility] END[facility]COPY;  Service: Cardiovascular;  Laterality: N/A;  . Left heart catheterization with coronary/graft angiogram  07/01/2011    Procedure: LEFT HEART CATHETERIZATION WITH Beatrix Fetters;  Surgeon: Sanda Klein, MD;  Location: [facility] CATH LAB;  Service: Cardiovascular;;  . Percutaneous coronary stent intervention (pci-s) Right 07/01/2011    Procedure: PERCUTANEOUS CORONARY STENT INTERVENTION (PCI-S);  Surgeon: Sanda Klein, MD;  Location: [facility] C[facility] LAB;  Service: Cardiovascular;  Laterality: Right;  . Umbilical hernia repair  2006  . Hernia repair    . Basal cell carcinoma excision Left 2015    ankle  . Esophagogastroduodenoscopy  01/2014    gastric ulcer, erosive gastroduodenitis, H Pylori negative.   . Esophagogastroduodenoscopy (egd) with propofol N/A 11/14/2014    Procedure: ESOPHAGOGASTRODUODENOSCOPY (EGD) WITH PROPOFOL;  Surgeon: Milus Banister, MD;  Location: [place];  Service: Endoscopy;  Laterality: N/A;    Family History  Problem Relation  Age of Onset  . Diabetes type II Mother   . Coronary artery disease    . Colon cancer Neg Hx   . CAD Father 18    CABG, PCI, died at 39  . Heart attack Father 20  . Hypertension Brother   . Diabetes Brother   . Hyperlipidemia Brother   . Brain cancer Maternal Grandmother   . COPD Paternal Grandfather   . Other      denies family history of DVT/PE  . Stroke Neg Hx    Social History:  reports that he has quit smoking. His smoking use included Cigarettes. He has a 8 pack-year smoking history. He has never used smokeless tobacco. He reports that he drinks alcohol. He reports that he uses illicit drugs (Marijuana).  Allergies:  Allergies  Allergen Reactions  . Diphenhydramine Hcl Anaphylaxis    Medications:                                                                                                                           Prior to Admission:  Prescriptions prior to admission  Medication Sig Dispense Refill Last Dose  . acetaminophen (TYLENOL) 325 MG tablet Take 2 tablets (650 mg total) by mouth every 6 (six) hours as needed for mild pain.   Past Week at Unknown time  . ALPRAZolam (XANAX) 0.25 MG tablet Take 0.5 tablets (0.125 mg total) by mouth every 8 (eight) hours as needed for anxiety. 45 tablet 5 over 30 days  . carvedilol (COREG) 6.25 MG tablet Take 0.5 tablets (3.125 mg total) by mouth 2 (two) times daily. 60 tablet 3 12/20/2014 at 800  . colchicine 0.6 MG tablet Take  0.6 mg by mouth as needed (for gout).   0 over 30 days  . feeding supplement, ENSURE ENLIVE, (ENSURE ENLIVE) LIQD Take 237 mLs by mouth 2 (two) times daily between meals. (Patient taking differently: Take 237 mLs by mouth daily. ) 60 Bottle 0 12/20/2014 at Unknown time  . fluticasone (FLONASE) 50 MCG/ACT nasal spray Place 2 sprays into both nostrils daily. (Patient taking differently: Place 2 sprays into both nostrils daily as needed for allergies. )  2 over 30 days  . HYDROcodone-acetaminophen (NORCO/VICODIN)  5-325 MG per tablet Take one tablet up to twice daily as needed for headache. (Patient taking differently: Take 1 tablet by mouth every 6 (six) hours as needed for moderate pain (headache). Take one tablet up to twice daily as needed for headache.) 60 tablet 0 Past Week at Unknown time  . hydrocortisone (ANUSOL-HC) 25 MG suppository Place 1 suppository (25 mg total) rectally daily. X 1 week; then, use as needed thereafter. (Patient taking differently: Place 25 mg rectally 2 (two) times daily as needed for hemorrhoids or itching. X 1 week; then, use as needed thereafter.) 30 suppository 11 over 30 days  . imipramine (TOFRANIL) 25 MG tablet Take 1 tablet (25 mg total) by mouth at bedtime. 30 tablet 3 12/19/2014 at Unknown time  . meclizine (ANTIVERT) 25 MG tablet Take 1 tablet (25 mg total) by mouth 3 (three) times daily as needed for dizziness. (Patient taking differently: Take 25 mg by mouth 3 (three) times daily as needed for dizziness (bed-time). ) 30 tablet 0 over 30 days  . Melatonin 10 MG CAPS Take 1 capsule by mouth at bedtime as needed (sleep).    over 30 days  . methylphenidate (RITALIN) 10 MG tablet Take up to 3 times a day po (Patient taking differently: Take 10 mg by mouth 2 (two) times daily with breakfast and lunch. ) 90 tablet 0 12/20/2014 at Unknown time  . mirtazapine (REMERON) 15 MG tablet Take 0.5 tablets (7.5 mg total) by mouth at bedtime.   12/19/2014 at Unknown time  . nitroGLYCERIN (NITROSTAT) 0.4 MG SL tablet Place 0.4 mg under the tongue every 5 (five) minutes as needed. For chest pain   never  . ondansetron (ZOFRAN) 4 MG tablet Take 1 tablet (4 mg total) by mouth every 12 (twelve) hours. Call if prior approval is needed. (Patient taking differently: Take 4 mg by mouth daily. ) 60 tablet 2 12/20/2014 at Unknown time  . OVER THE COUNTER MEDICATION Inhale 1 application into the lungs at bedtime. C-PAP   Taking  . pantoprazole (PROTONIX) 40 MG tablet TAKE 1 TABLET BY MOUTH 2 TIMES A DAY.  60 tablet 3 12/20/2014 at Unknown time  . promethazine (PHENERGAN) 12.5 MG tablet Take 1-2 tablets (12.5-25 mg total) by mouth every 6 (six) hours as needed for nausea or vomiting. 30 tablet 0 over 30 days  . rivaroxaban (XARELTO) 20 MG TABS tablet Take 1 tablet (20 mg total) by mouth daily with supper. 30 tablet 11 12/19/2014 at Unknown time  . rosuvastatin (CRESTOR) 40 MG tablet Take 1 tablet (40 mg total) by mouth daily. (Patient taking differently: Take 40 mg by mouth at bedtime. ) 90 tablet 0 12/19/2014 at Unknown time  . Sennosides (SENOKOT PO) Take 2 tablets by mouth daily.   12/20/2014 at Unknown time  . Vitamin D, Ergocalciferol, (DRISDOL) 50000 UNITS CAPS capsule Take 50,000 Units by mouth every 7 (seven) days. Take on Mondays   12/19/2014 at Unknown time  Scheduled: . cefTAZidime (FORTAZ)  IV  2 g Intravenous Q12H  . feeding supplement (ENSURE ENLIVE)  237 mL Oral Daily  . mirtazapine  7.5 mg Oral QHS  . pantoprazole  40 mg Oral BID  . rivaroxaban  20 mg Oral Q supper  . rosuvastatin  40 mg Oral QHS  . senna  1 tablet Oral BID  . vancomycin  1,000 mg Intravenous Q12H    ROS:                                                                                                                                       History obtained from the patient  General ROS: negative for - chills, fatigue, fever, night sweats, weight gain or weight loss Psychological ROS: negative for - behavioral disorder, hallucinations, memory difficulties, mood swings or suicidal ideation Ophthalmic ROS: negative for - blurry vision, double vision, eye pain or loss of vision ENT ROS: negative for - epistaxis, nasal discharge, oral lesions, sore throat, tinnitus or vertigo Allergy and Immunology ROS: negative for - hives or itchy/watery eyes Hematological and Lymphatic ROS: negative for - bleeding problems, bruising or swollen lymph nodes Endocrine ROS: negative for - galactorrhea, hair pattern changes,  polydipsia/polyuria or temperature intolerance Respiratory ROS: negative for - cough, hemoptysis, shortness of breath or wheezing Cardiovascular ROS: negative for - chest pain, dyspnea on exertion, edema or irregular heartbeat Gastrointestinal ROS: negative for - abdominal pain, diarrhea, hematemesis, nausea/vomiting or stool incontinence Genito-Urinary ROS: negative for - dysuria, hematuria, incontinence or urinary frequency/urgency Musculoskeletal ROS: negative for - joint swelling or muscular weakness Neurological ROS: as noted in HPI Dermatological ROS: negative for rash and skin lesion changes  Neurologic Examination:                                                                                                      Blood pressure 87/53, pulse 81, temperature 97.3 F (36.3 C), temperature source Oral, resp. rate 15, height 6' (1.829 m), weight 92.352 kg (203 lb 9.6 oz), SpO2 100 %.  HEENT-  Normocephalic, no lesions, without obvious abnormality.  Normal external eye and conjunctiva.  Normal TM's bilaterally.  Normal auditory canals and external ears. Normal external nose, mucus membranes and septum.  Normal pharynx. Cardiovascular- S1, S2 normal, pulses palpable throughout   Lungs- chest clear, no wheezing, rales, normal symmetric air entry Abdomen- normal findings: bowel sounds normal Extremities- no edema Lymph-no adenopathy palpable Musculoskeletal-no joint tenderness, deformity or swelling Skin-warm and dry, no  hyperpigmentation, vitiligo, or suspicious lesions  Neurological Examination Mental Status: Alert, oriented, thought content appropriate.  Speech dysarthria without evidence of aphasia.  Able to follow 3 step commands without difficulty. Cranial Nerves: II: Discs flat bilaterally; Visual fields grossly normal, pupils equal, round, reactive to light and accommodation III,IV, VI: ptosis not present, extra-ocular motions intact bilaterally V,VII: smile asymmetric on the left  (old), facial light touch sensation normal bilaterally VIII: hearing normal bilaterally IX,X: uvula rises symmetrically XI: bilateral shoulder shrug XII: midline tongue extension Motor: Right : Upper extremity   5/5    Left:     Upper extremity   0/5 (old)  Lower extremity   5/5     Lower extremity   0/5 (old) Tone and bulk:normal tone throughout; no atrophy noted Sensory: Pinprick and light touch intact throughout, bilaterally Deep Tendon Reflexes: 2+ and on the right 3+ on the left UE and 2+ on left LE Plantars: Right: downgoing   Left: up going Cerebellar: normal finger-to-nose on the right and normal heel-to-shin test on the right Gait: not tested     Lab Results: Basic Metabolic Panel:  Recent Labs Lab 12/20/14 1300 12/21/14 0335  NA 134* 133*  K 3.3* 3.0*  CL 95* 107  CO2 25 19*  GLUCOSE 123* 110*  BUN 14 10  CREATININE 1.62* 1.12  CALCIUM 10.1 8.2*    Liver Function Tests:  Recent Labs Lab 12/20/14 1300  AST 45*  ALT 43  ALKPHOS 97  BILITOT 1.3*  PROT 7.8  ALBUMIN 3.4*   No results for input(s): LIPASE, AMYLASE in the last 168 hours. No results for input(s): AMMONIA in the last 168 hours.  CBC:  Recent Labs Lab 12/20/14 1300 12/21/14 0335  WBC 10.1 5.8  NEUTROABS 6.8  --   HGB 17.6* 14.1  HCT 51.7 40.7  MCV 85.2 84.3  PLT 189 115*    Cardiac Enzymes: No results for input(s): CKTOTAL, CKMB, CKMBINDEX, TROPONINI in the last 168 hours.  Lipid Panel: No results for input(s): CHOL, TRIG, HDL, CHOLHDL, VLDL, LDLCALC in the last 168 hours.  CBG: No results for input(s): GLUCAP in the last 168 hours.  Microbiology: Results for orders placed or performed during the hospital encounter of 12/20/14  Blood Culture (routine x 2)     Status: None (Preliminary result)   Collection Time: 12/20/14  1:00 PM  Result Value Ref Range Status   Specimen Description BLOOD RIGHT ARM  Final   Special Requests BOTTLES DRAWN AEROBIC AND ANAEROBIC 5CC  Final    Culture NO GROWTH < 24 HOURS  Final   Report Status PENDING  Incomplete  Blood Culture (routine x 2)     Status: None (Preliminary result)   Collection Time: 12/20/14  1:30 PM  Result Value Ref Range Status   Specimen Description BLOOD RIGHT HAND  Final   Special Requests BOTTLES DRAWN AEROBIC ONLY 5ML  Final   Culture NO GROWTH < 24 HOURS  Final   Report Status PENDING  Incomplete  MRSA PCR Screening     Status: None   Collection Time: 12/20/14  5:52 PM  Result Value Ref Range Status   MRSA by PCR NEGATIVE NEGATIVE Final    Comment:        The GeneXpert MRSA Assay (FDA approved for NASAL specimens only), is one component of a comprehensive MRSA colonization surveillance program. It is not intended to diagnose MRSA infection nor to guide or monitor treatment for MRSA infections.   Urine culture  Status: None (Preliminary result)   Collection Time: 12/20/14  7:14 PM  Result Value Ref Range Status   Specimen Description URINE, CATHETERIZED  Final   Special Requests NONE  Final   Culture NO GROWTH < 24 HOURS  Final   Report Status PENDING  Incomplete    Coagulation Studies: No results for input(s): LABPROT, INR in the last 72 hours.  Imaging: Ct Head Wo Contrast  12/21/2014   CLINICAL DATA:  Acute onset of dizziness.  Initial encounter.  EXAM: CT HEAD WITHOUT CONTRAST  TECHNIQUE: Contiguous axial images were obtained from the base of the skull through the vertex without intravenous contrast.  COMPARISON:  CT of the head performed 11/15/2014  FINDINGS: There is no evidence of acute infarction, mass lesion, or intra- or extra-axial hemorrhage on CT.  There is a chronic right MCA territory infarct, with associated encephalomalacia and ex vacuo dilatation of the right lateral ventricle. This involves the right basal ganglia. Periventricular and subcortical white matter change likely reflects small vessel ischemic microangiopathy. Prominence of the sulci suggests mild cortical  volume loss. Cerebellar atrophy is noted.  The brainstem and fourth ventricle are within normal limits. No mass effect or midline shift is seen.  There is no evidence of fracture; visualized osseous structures are unremarkable in appearance. The orbits are within normal limits. Mild mucosal thickening is noted at the maxillary sinuses bilaterally. The remaining paranasal sinuses and mastoid air cells are well-aerated. No significant soft tissue abnormalities are seen.  IMPRESSION: 1. No acute intracranial pathology seen on CT. 2. Chronic right MCA territory infarct, with associated encephalomalacia and ex vacuo dilatation of the right lateral ventricle. 3. Small vessel ischemic microangiopathy and mild cortical volume loss. 4. Mild mucosal thickening at the maxillary sinuses bilaterally.   Electronically Signed   By: Garald Balding M.D.   On: 12/21/2014 01:54   Dg Chest Portable 1 View  12/20/2014   CLINICAL DATA:  Hypotension and code sepsis.  EXAM: PORTABLE CHEST 1 VIEW  COMPARISON:  11/11/2014  FINDINGS: Lung volumes are relatively low bilaterally with bibasilar atelectasis present. There may conceivably be a subtle infiltrate at the left lung base. No edema, pleural fluid, pneumothorax or nodule is identified. The heart size is normal status post prior CABG. There is stable appearance of an implanted loop recorder.  IMPRESSION: Low lung volumes with bibasilar atelectasis. Possible subtle infiltrate at the left lung base.   Electronically Signed   By: Aletta Edouard M.D.   On: 12/20/2014 13:43       Assessment and plan discussed with with attending physician and they are in agreement.    Etta Quill PA-C [facility] [phone]  12/21/2014, 2:51 PM   Assessment: 65 y.o. male with onset of right UE weakness, facial droop and numbness. Currently his symptoms of weakness have resolved and exam only shows dysarthria and upper lip numbness. Likely left subcortical infarct. Patient is not a  tPA candidate as he is on Xeralto and symptoms are to mild for IR.    Stroke Risk Factors - hyperlipidemia and hypertension   Recommend 1. HgbA1c, fasting lipid panel 2. MRI, MRA  of the brain without contrast 3. PT consult, OT consult, Speech consult 4. Echocardiogram 5. Carotid dopplers 6. Prophylactic therapy-continue Xeralto 7. Risk factor modification 8. Telemetry monitoring 9. Frequent neuro checks 10 NPO until passes stroke swallow screen   Patient seen and examined together with physician assistant and I concur with the assessment and plan.  Dorian Pod, MD

## 2014-12-21 NOTE — Progress Notes (Signed)
ANTIBIOTIC CONSULT NOTE - INITIAL  Pharmacy Consult for vancomycin and ceftazidime Indication: rule out pneumonia/sepsis  Allergies  Allergen Reactions  . Diphenhydramine Hcl Anaphylaxis    Patient Measurements: Height: 6' (182.9 cm) Weight: 203 lb 9.6 oz (92.352 kg) IBW/kg (Calculated) : 77.6 Adjusted Body Weight: 82.8 kg Ideal Body Weight: 77.6 kg  Vital Signs: Temp: 97.5 F (36.4 C) (10/05 0824) Temp Source: Oral (10/05 0824) BP: 96/59 mmHg (10/05 0824) Pulse Rate: 79 (10/05 0824) Intake/Output from previous day: 10/04 0701 - 10/05 0700 In: 4686.7 [I.V.:3986.7; IV Piggyback:700] Out: 200 [Urine:200] Intake/Output from this shift:    Labs:  Recent Labs  12/20/14 1300 12/21/14 0335  WBC 10.1 5.8  HGB 17.6* 14.1  PLT 189 115*  CREATININE 1.62* 1.12   Estimated Creatinine Clearance: 72.2 mL/min (by C-G formula based on Cr of 1.12). No results for input(s): VANCOTROUGH, VANCOPEAK, VANCORANDOM, GENTTROUGH, GENTPEAK, GENTRANDOM, TOBRATROUGH, TOBRAPEAK, TOBRARND, AMIKACINPEAK, AMIKACINTROU, AMIKACIN in the last 72 hours.   Microbiology: Recent Results (from the past 720 hour(s))  Blood Culture (routine x 2)     Status: None (Preliminary result)   Collection Time: 12/20/14  1:30 PM  Result Value Ref Range Status   Specimen Description BLOOD RIGHT HAND  Final   Special Requests BOTTLES DRAWN AEROBIC ONLY 5ML  Final   Culture PENDING  Incomplete   Report Status PENDING  Incomplete  MRSA PCR Screening     Status: None   Collection Time: 12/20/14  5:52 PM  Result Value Ref Range Status   MRSA by PCR NEGATIVE NEGATIVE Final    Comment:        The GeneXpert MRSA Assay (FDA approved for NASAL specimens only), is one component of a comprehensive MRSA colonization surveillance program. It is not intended to diagnose MRSA infection nor to guide or monitor treatment for MRSA infections.     Medical History: Past Medical History  Diagnosis Date  . Coronary  artery disease     stent, then CABG 07/20/10  . Hypertension   . Hyperlipidemia   . Obesity (BMI 30-39.9)   . H/O cardiomyopathy     ischemic, now last echo 07/30/11, EF 38%W  . Diverticulosis   . Internal hemorrhoids   . Tubular adenoma of colon   . Plantar fasciitis of left foot   . Basal cell carcinoma of skin of left ankle   . NSTEMI (non-ST elevated myocardial infarction) ([place])     NSTEMI- last cath 06/2011-Stent to LCX-DES, last nuc 07/30/11 low risk  . MI (myocardial infarction) ([place])     "I had 4 before I retired in 2006" (11/11/2014)  . Pulmonary embolism ([place]) 03/2014  . OSA on CPAP     since July 2013  . GERD (gastroesophageal reflux disease)   . Bleeding stomach ulcer 2015  . Daily headache     "for the past month" (11/11/2014)  . Stroke ([place]) 01/2014    "no control of LUE, can slightly LLE; memory problems since" (11/11/2014)  . Arthritis     "back" (11/11/2014)  . Gout   . Depression     Medications:  Prescriptions prior to admission  Medication Sig Dispense Refill Last Dose  . acetaminophen (TYLENOL) 325 MG tablet Take 2 tablets (650 mg total) by mouth every 6 (six) hours as needed for mild pain.   Past Week at Unknown time  . ALPRAZolam (XANAX) 0.25 MG tablet Take 0.5 tablets (0.125 mg total) by mouth every 8 (eight) hours as needed for anxiety. [place]  tablet 5 over 30 days  . carvedilol (COREG) 6.25 MG tablet Take 0.5 tablets (3.125 mg total) by mouth 2 (two) times daily. 60 tablet 3 12/20/2014 at 800  . colchicine 0.6 MG tablet Take 0.6 mg by mouth as needed (for gout).   0 over 30 days  . feeding supplement, ENSURE ENLIVE, (ENSURE ENLIVE) LIQD Take 237 mLs by mouth 2 (two) times daily between meals. (Patient taking differently: Take 237 mLs by mouth daily. ) 60 Bottle 0 12/20/2014 at Unknown time  . fluticasone (FLONASE) 50 MCG/ACT nasal spray Place 2 sprays into both nostrils daily. (Patient taking differently: Place 2 sprays into both nostrils daily as needed for  allergies. )  2 over 30 days  . HYDROcodone-acetaminophen (NORCO/VICODIN) 5-325 MG per tablet Take one tablet up to twice daily as needed for headache. (Patient taking differently: Take 1 tablet by mouth every 6 (six) hours as needed for moderate pain (headache). Take one tablet up to twice daily as needed for headache.) 60 tablet 0 Past Week at Unknown time  . hydrocortisone (ANUSOL-HC) 25 MG suppository Place 1 suppository (25 mg total) rectally daily. X 1 week; then, use as needed thereafter. (Patient taking differently: Place 25 mg rectally 2 (two) times daily as needed for hemorrhoids or itching. X 1 week; then, use as needed thereafter.) 30 suppository 11 over 30 days  . imipramine (TOFRANIL) 25 MG tablet Take 1 tablet (25 mg total) by mouth at bedtime. 30 tablet 3 12/19/2014 at Unknown time  . meclizine (ANTIVERT) 25 MG tablet Take 1 tablet (25 mg total) by mouth 3 (three) times daily as needed for dizziness. (Patient taking differently: Take 25 mg by mouth 3 (three) times daily as needed for dizziness (bed-time). ) 30 tablet 0 over 30 days  . Melatonin 10 MG CAPS Take 1 capsule by mouth at bedtime as needed (sleep).    over 30 days  . methylphenidate (RITALIN) 10 MG tablet Take up to 3 times a day po (Patient taking differently: Take 10 mg by mouth 2 (two) times daily with breakfast and lunch. ) 90 tablet 0 12/20/2014 at Unknown time  . mirtazapine (REMERON) 15 MG tablet Take 0.5 tablets (7.5 mg total) by mouth at bedtime.   12/19/2014 at Unknown time  . nitroGLYCERIN (NITROSTAT) 0.4 MG SL tablet Place 0.4 mg under the tongue every 5 (five) minutes as needed. For chest pain   never  . ondansetron (ZOFRAN) 4 MG tablet Take 1 tablet (4 mg total) by mouth every 12 (twelve) hours. Call if prior approval is needed. (Patient taking differently: Take 4 mg by mouth daily. ) 60 tablet 2 12/20/2014 at Unknown time  . OVER THE COUNTER MEDICATION Inhale 1 application into the lungs at bedtime. C-PAP   Taking  .  pantoprazole (PROTONIX) 40 MG tablet TAKE 1 TABLET BY MOUTH 2 TIMES A DAY. 60 tablet 3 12/20/2014 at Unknown time  . promethazine (PHENERGAN) 12.5 MG tablet Take 1-2 tablets (12.5-25 mg total) by mouth every 6 (six) hours as needed for nausea or vomiting. 30 tablet 0 over 30 days  . rivaroxaban (XARELTO) 20 MG TABS tablet Take 1 tablet (20 mg total) by mouth daily with supper. 30 tablet 11 12/19/2014 at Unknown time  . rosuvastatin (CRESTOR) 40 MG tablet Take 1 tablet (40 mg total) by mouth daily. (Patient taking differently: Take 40 mg by mouth at bedtime. ) 90 tablet 0 12/19/2014 at Unknown time  . Sennosides (SENOKOT PO) Take 2 tablets by mouth  daily.   12/20/2014 at Unknown time  . Vitamin D, Ergocalciferol, (DRISDOL) 50000 UNITS CAPS capsule Take 50,000 Units by mouth every 7 (seven) days. Take on Mondays   12/19/2014 at Unknown time   Assessment: 10 yoM being initiated on empiric vancomycin and ceftazidime for potential HCAP/Code Sepsis. CXR was concerning for LLL PNA. Now day #2 of abx for PNA. Afebrile, WBC wnl. Lactic acid down to 1.2 from 2.3. SCr improved to 1.12, CrCl ~76ml/min.   Goal of Therapy:  Vancomycin trough level 15-20 mcg/ml  Resolution of infection  Plan:  Change Vancomycin to 1 g IV q12h Continue Ceftazidime 2 g IV q8h Expected duration 7 days with resolution of temperature and/or normalization of WBC Monitor clinical picture, renal function, VT prn F/U C&S, abx deescalation / LOT  Elenor Quinones, PharmD Clinical Pharmacist Pager [phone] 12/21/2014 8:47 AM

## 2014-12-21 NOTE — Telephone Encounter (Signed)
Additional paperwork received from [employer] to be completed and is on your desk.

## 2014-12-21 NOTE — Progress Notes (Signed)
Patient reports distant h/o esophageal issues, but recently on regular diet at home with no swallowing difficulties.

## 2014-12-21 NOTE — Code Documentation (Signed)
64yo male admitted for HCAP and SIRS on 12/20/2014.  Patient with h/o stroke with left sided residual deficits.  Patient on Xarelto with last dose 10/4 PM.  Today patient was eating lunch today when he noticed that his right lip was numb.  His spouse noticed a right facial droop and his speech to be slurred.  Code Stroke activated.  Patient to CT with bedside RN.  Stroke team to the bedside.  NIHSS 9, see documentation for details and code stroke times.  Exam showing patient's left sided residual deficits and patient reporting slurred speech.  Patient is contraindicated for tPA d/t recent dose of Xarelto.  No acute stroke treatment at this time per Dr. Armida Sans.  Stroke swallow screen completed, see documentation.  Bedside handoff with bedside RN.

## 2014-12-21 NOTE — Evaluation (Signed)
Physical Therapy Evaluation Patient Details Name: Gregory Wiley MRN: [id number] DOB: 26-Mar-1950 Today's Date: 12/21/2014   History of Present Illness  64 y.o. male past medical history of hypertension, CAD status post CABG in 2012, chronic combined CHF dyslipidemia, CVA with residual left hemiparesis presents to the emergency department with the chief complaint of ongoing weakness, dizziness worsening over the last 3 days. Initial evaluation in the emergency department reveals a chest x-ray concerning for healthcare associated pneumonia, acute kidney injury, hypotension, tachycardia. Patient reports persistent generalized weakness and dizziness particularly with position change over the last several weeks. He states worsening over the last 3 days PTA.  Clinical Impression  Patient in bed, agreeable to participate in PT today. Wife in room throughout session. Patient was able to transfer as described below, which patient reports is about baseline. Patient will benefit from continued PT to increase safety with transfers and improve independence with transfers so that he can regain independence.    Follow Up Recommendations SNF;Supervision/Assistance - 24 hour  Patient became agitated multiple times discussing d/c plan. Will probably refuse SNF, will need at least HHPT. Wife interested in [facility] nursing assistance.    Equipment Recommendations  None recommended by PT    Recommendations for Other Services       Precautions / Restrictions Precautions Precautions: Fall Precaution Comments: L weakness Restrictions Weight Bearing Restrictions: No      Mobility  Bed Mobility Overal bed mobility: Needs Assistance Bed Mobility: Supine to Sit     Supine to sit: Mod assist     General bed mobility comments: Min A for L LE to EOB, mod A to pull trunk to sitting. Patient was able to pull about 50% of body weight to sit upright.  Transfers Overall transfer level: Needs assistance    Transfers: Sit to/from Stand;Stand Pivot Transfers Sit to Stand: Mod assist;+2 safety/equipment Stand pivot transfers: Min assist;+2 safety/equipment       General transfer comment: Required +2 due to patient's inability to sequentially describe transfer. Required mod A to stand, min A for actual stand-pivot, during which patient became agitated with therapy because he "needs more space to lean."  Ambulation/Gait                Stairs            Wheelchair Mobility    Modified Rankin (Stroke Patients Only)       Balance Overall balance assessment: Needs assistance Sitting-balance support: Feet supported;Single extremity supported Sitting balance-Leahy Scale: Fair     Standing balance support: Bilateral upper extremity supported Standing balance-Leahy Scale: Poor                               Pertinent Vitals/Pain Pain Assessment: No/denies pain  BP throughout morning 89-96/50s BP in chair after transfer = 104/71 BP after sitting in chair 8 mins = 94/71    Home Living Family/patient expects to be discharged to:: Private residence Living Arrangements: Spouse/significant other Available Help at Discharge: Family;Available 24 hours/day Type of Home: House Home Access: Ramped entrance     Home Layout: One level Home Equipment: Cane - quad;Wheelchair - manual;Wheelchair - power;Grab bars - tub/shower Additional Comments: Wife has MS, provides all necessary assistance for patient at home.    Prior Function Level of Independence: Needs assistance   Gait / Transfers Assistance Needed: Uses quad cane at home with assistance from wife via guarding and gait  belt.  ADL's / Homemaking Assistance Needed: Needs assist from wife for bathing/dressing  Comments: Patient has PMH of frequent falls, but has not fallen recently due to wife's increased assistance.     Hand Dominance   Dominant Hand: Right    Extremity/Trunk Assessment   Upper Extremity  Assessment: LUE deficits/detail       LUE Deficits / Details: No movement in L UE.   Lower Extremity Assessment: LLE deficits/detail   LLE Deficits / Details: L LE weak in WBing, needs assistance for L LE movement in the bed.     Communication   Communication: Expressive difficulties (Speech is more slurred than baseline per wife report.)  Cognition Arousal/Alertness: Awake/alert Behavior During Therapy: Anxious Overall Cognitive Status: Within Functional Limits for tasks assessed                      General Comments      Exercises        Assessment/Plan    PT Assessment Patient needs continued PT services  PT Diagnosis Difficulty walking;Abnormality of gait;Generalized weakness;Hemiplegia non-dominant side   PT Problem List Decreased strength;Decreased range of motion;Decreased activity tolerance;Decreased balance;Decreased mobility;Decreased coordination;Decreased safety awareness;Cardiopulmonary status limiting activity  PT Treatment Interventions DME instruction;Gait training;Functional mobility training;Therapeutic activities;Therapeutic exercise;Balance training;Neuromuscular re-education;Patient/family education;Wheelchair mobility training   PT Goals (Current goals can be found in the Care Plan section) Acute Rehab PT Goals Patient Stated Goal: Go home PT Goal Formulation: With patient/family Time For Goal Achievement: 01/04/15 Potential to Achieve Goals: Fair    Frequency Min 3X/week   Barriers to discharge   Wife states repeatedly that she is able to give him necessary assistance when he is feeling well. He started to feel worse and that is why she brought him to the hospital.    Co-evaluation               End of Session Equipment Utilized During Treatment: Gait belt Activity Tolerance: Patient limited by fatigue;Treatment limited secondary to medical complications (Comment) (Feels "drunk" throughout.) Patient left: in chair;with call  bell/phone within reach;with chair alarm set;with family/visitor present Nurse Communication: Mobility status         Time: 0935-1003 PT Time Calculation (min) (ACUTE ONLY): 28 min   Charges:  Evaluation 8-22 min Self care 8-22 min       PT G CodesRoanna Epley, SPT [phone] 12/21/2014, 3:11 PM  I have read, reviewed and agree with student's note.   [place] [phone] (pager)

## 2014-12-21 NOTE — Progress Notes (Signed)
[place] TEAM 1 - Stepdown/ICU TEAM PROGRESS NOTE  Gregory Wiley OZY:[id number] DOB: 01/27/1951 DOA: 12/20/2014 PCP: [name], MD  Admit HPI / Brief Narrative: 64 year old male with a history of hypertension, coronary artery disease, CVA 10 months prior who  presented to the ER because of lightheadedness and orthostatic hypotension requiring fluid resuscitation with 3 L of fluid.  Patient also endorsed decreased appetite, nausea, and constipation. Patient has also had a dry nonproductive cough several days, and chest x-ray was concerning for left lower lobe pneumonia.  Lactic acid was elevated at 3.17.  HPI/Subjective: Family visiting today felt the pt had acutely developed an R facial droop and slurring of speech.  Code Stroke was called.  Stat CT head unremarkable.  Exam findings not impressive.  No acute intervention indicated.    At the time of my visit I am not able to discern a clear facial droop, or definite slurring of speech.  Pt himself denies deficits, but c/o R lateral chest wall pain overlying his 12th rib.  He denies sob, n/v, or abdom pain.    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  Anticoag continues   Acute renal failure Improving rapidly w/ volume expansion - follow trend   Hypokalemia  Replace and follow - check Mg  Chronic combined systolic and diastolic CHF  TTE June 0488 noted EF 40-45% and grade 1 DD, improved from 25-30% Jan 2016  CAD s/p CABG x4 2012  Chronic constipation   Code Status: FULL Family Communication: spoke w/ daughter at bedside    Disposition Plan: SDU   Consultants: Neurology  Procedures: none  Antibiotics: ceftaz 10/4 > vanc 10/4 > 10/5  DVT prophylaxis: xarelto   Objective: Blood pressure 87/53, pulse 81, temperature 97.3 F (36.3 C), temperature source Oral, resp. rate 15, height 6' (1.829 m), weight 92.352 kg (203 lb 9.6 oz), SpO2 100 %.  Intake/Output Summary (Last 24 hours) at 12/21/14 1414 Last data filed at 12/21/14 1142  Gross per 24 hour  Intake 5046.66 ml  Output    300 ml  Net 4746.66 ml   Exam: General: No acute respiratory distress Lungs: Clear to auscultation bilaterally without wheezes or crackles Cardiovascular: Regular rate and rhythm without murmur gallop or rub Abdomen: Nontender, nondistended, soft, bowel sounds positive, no rebound, no ascites, no appreciable mass Extremities: No significant cyanosis, clubbing, or edema bilateral lower extremities  Data Reviewed: Basic Metabolic Panel:  Recent Labs Lab 12/20/14 1300 12/21/14 0335  NA 134* 133*  K 3.3* 3.0*  CL 95* 107  CO2 25 19*  GLUCOSE 123* 110*  BUN 14 10  CREATININE 1.62* 1.12  CALCIUM 10.1 8.2*    CBC:  Recent Labs Lab 12/20/14 1300 12/21/14 0335  WBC 10.1 5.8  NEUTROABS 6.8  --   HGB 17.6* 14.1  HCT 51.7 40.7  MCV 85.2 84.3  PLT 189 115*    Liver Function Tests:  Recent Labs Lab 12/20/14 1300  AST 45*  ALT 43  ALKPHOS 97  BILITOT 1.3*  PROT 7.8  ALBUMIN 3.4*    Recent Results (from the past 240  hour(s))  Blood Culture (routine x 2)     Status: None (Preliminary result)   Collection Time: 12/20/14  1:00 PM  Result Value Ref Range Status   Specimen Description BLOOD RIGHT ARM  Final   Special Requests BOTTLES DRAWN AEROBIC AND ANAEROBIC 5CC  Final   Culture NO GROWTH < 24 HOURS  Final   Report Status PENDING  Incomplete  Blood Culture (routine x 2)     Status: None (Preliminary result)   Collection Time: 12/20/14  1:30 PM  Result Value Ref Range Status   Specimen Description  BLOOD RIGHT HAND  Final   Special Requests BOTTLES DRAWN AEROBIC ONLY 5ML  Final   Culture NO GROWTH < 24 HOURS  Final   Report Status PENDING  Incomplete  MRSA PCR Screening     Status: None   Collection Time: 12/20/14  5:52 PM  Result Value Ref Range Status   MRSA by PCR NEGATIVE NEGATIVE Final    Comment:        The GeneXpert MRSA Assay (FDA approved for NASAL specimens only), is one component of a comprehensive MRSA colonization surveillance program. It is not intended to diagnose MRSA infection nor to guide or monitor treatment for MRSA infections.   Urine culture     Status: None (Preliminary result)   Collection Time: 12/20/14  7:14 PM  Result Value Ref Range Status   Specimen Description URINE, CATHETERIZED  Final   Special Requests NONE  Final   Culture NO GROWTH < 24 HOURS  Final   Report Status PENDING  Incomplete     Studies:   Recent x-ray studies have been reviewed in detail by the Attending Physician  Scheduled Meds:  Scheduled Meds: . cefTAZidime (FORTAZ)  IV  2 g Intravenous Q12H  . feeding supplement (ENSURE ENLIVE)  237 mL Oral Daily  . mirtazapine  7.5 mg Oral QHS  . pantoprazole  40 mg Oral BID  . rivaroxaban  20 mg Oral Q supper  . rosuvastatin  40 mg Oral QHS  . senna  1 tablet Oral BID  . vancomycin  1,000 mg Intravenous Q12H    Time spent on care of this patient: 35 mins   MCCLUNG,JEFFREY T , MD   [facility] Office  [phone] Pager - Text Page per Shea Evans as per below:  On-Call/Text Page:      [email or web address]      password [email or web address]  If 7PM-7AM, please contact night-coverage [email or web address] Password [email or web address] 12/21/2014, 2:14 PM   LOS: 1 day

## 2014-12-22 DIAGNOSIS — I255 Ischemic cardiomyopathy: Secondary | ICD-10-CM | POA: Diagnosis present

## 2014-12-22 DIAGNOSIS — G4733 Obstructive sleep apnea (adult) (pediatric): Secondary | ICD-10-CM

## 2014-12-22 LAB — CBC
HCT: 37.7 % — ABNORMAL LOW (ref 39.0–52.0)
Hemoglobin: 12.8 g/dL — ABNORMAL LOW (ref 13.0–17.0)
MCH: 29.2 pg (ref 26.0–34.0)
MCHC: 34 g/dL (ref 30.0–36.0)
MCV: 86.1 fL (ref 78.0–100.0)
Platelets: 126 10*3/uL — ABNORMAL LOW (ref 150–400)
RBC: 4.38 MIL/uL (ref 4.22–5.81)
RDW: 15.8 % — ABNORMAL HIGH (ref 11.5–15.5)
WBC: 4.3 10*3/uL (ref 4.0–10.5)

## 2014-12-22 LAB — COMPREHENSIVE METABOLIC PANEL
ALT: 30 U/L (ref 17–63)
AST: 31 U/L (ref 15–41)
Albumin: 2.2 g/dL — ABNORMAL LOW (ref 3.5–5.0)
Alkaline Phosphatase: 67 U/L (ref 38–126)
Anion gap: 8 (ref 5–15)
BUN: <5 mg/dL — ABNORMAL LOW (ref 6–20)
CO2: 22 mmol/L (ref 22–32)
Calcium: 8.2 mg/dL — ABNORMAL LOW (ref 8.9–10.3)
Chloride: 104 mmol/L (ref 101–111)
Creatinine, Ser: 0.97 mg/dL (ref 0.61–1.24)
GFR calc Af Amer: >60 mL/min (ref >60)
GFR calc non Af Amer: >60 mL/min (ref >60)
Glucose, Bld: 101 mg/dL — ABNORMAL HIGH (ref 65–99)
Potassium: 3.4 mmol/L — ABNORMAL LOW (ref 3.5–5.1)
Sodium: 134 mmol/L — ABNORMAL LOW (ref 135–145)
Total Bilirubin: 0.4 mg/dL (ref 0.3–1.2)
Total Protein: 5.2 g/dL — ABNORMAL LOW (ref 6.5–8.1)

## 2014-12-22 LAB — VITAMIN B12: Vitamin B-12: 559 pg/mL (ref 180–914)

## 2014-12-22 LAB — LACTIC ACID, PLASMA: Lactic Acid, Venous: 1.4 mmol/L (ref 0.5–2.0)

## 2014-12-22 LAB — FOLATE: Folate: 7.7 ng/mL (ref >5.9)

## 2014-12-22 LAB — MAGNESIUM
Magnesium: 1.8 mg/dL (ref 1.7–2.4)
Magnesium: 1.8 mg/dL (ref 1.7–2.4)

## 2014-12-22 LAB — CORTISOL: Cortisol, Plasma: 3.6 ug/dL

## 2014-12-22 LAB — TSH: TSH: 0.988 u[IU]/mL (ref 0.350–4.500)

## 2014-12-22 LAB — POTASSIUM: Potassium: 4.6 mmol/L (ref 3.5–5.1)

## 2014-12-22 MED ORDER — DM-GUAIFENESIN ER 30-600 MG PO TB12
1.0000 | ORAL_TABLET | Freq: Two times a day (BID) | ORAL | Status: DC
  Administered 2014-12-22 – 2014-12-27 (×10): 1 via ORAL
  Filled 2014-12-22 (×10): qty 1

## 2014-12-22 MED ORDER — POTASSIUM CHLORIDE CRYS ER 20 MEQ PO TBCR
40.0000 meq | EXTENDED_RELEASE_TABLET | Freq: Once | ORAL | Status: AC
  Administered 2014-12-22: 40 meq via ORAL
  Filled 2014-12-22: qty 2

## 2014-12-22 MED ORDER — DEXTROSE 5 % IV SOLN
2.0000 g | Freq: Three times a day (TID) | INTRAVENOUS | Status: AC
  Administered 2014-12-22 – 2014-12-26 (×14): 2 g via INTRAVENOUS
  Filled 2014-12-22 (×16): qty 2

## 2014-12-22 MED ORDER — MAGNESIUM OXIDE 400 (241.3 MG) MG PO TABS
400.0000 mg | ORAL_TABLET | Freq: Once | ORAL | Status: AC
  Administered 2014-12-22: 400 mg via ORAL
  Filled 2014-12-22: qty 1

## 2014-12-22 NOTE — Progress Notes (Signed)
CSW spoke with pt and wife concerning PT recommendation for SNF- pt is adamantly against going to SNF and would like to continue getting home health services as he was prior to this admission.  RNCM aware of pt desire to return home  CSW signing off.  Domenica Reamer, [place] Social Worker [phone]

## 2014-12-22 NOTE — Telephone Encounter (Signed)
I'll work on the hard copy.  Thanks.  

## 2014-12-22 NOTE — Progress Notes (Signed)
RT placed patient on CPAP and patient is resting comfortably.

## 2014-12-22 NOTE — Progress Notes (Signed)
STROKE TEAM PROGRESS NOTE   HISTORY Gregory Wiley is an 64 y.o. male withHTN, hyperlipidemia, CAD s/p stenting and CABG, MI, ischemic cardiomyopathy, previous stroke resulting in left facial droop and left hemiplegia. Patient is currently on Xeralto for PE. He is admitted for weakness, dizziness, hypotension and possible PNA. Today 12/21/2014 while eating lunch he complained of right upper lip numbness, right sided weakness and dysarthria. LKW 1100a. Code stroke was called. CT head obtained showed old right temporal parietal infarct but no new infarct or bleed. He is not a tPA candidate secondary to being Xarelto and receiving last dose last night. Symptoms in the ED are minimal and he was not an IR candidate. His BP was 99/70 and receiving fluid bolus. He was admitted for further evaluation and treatment.   SUBJECTIVE (INTERVAL HISTORY) No family is at the bedside. Overall he feels his condition is stable. He  recounted above history with Dr. Leonie Man.   OBJECTIVE Temp:  [97.3 F (36.3 C)-98.1 F (36.7 C)] 97.5 F (36.4 C) (10/06 0804) Pulse Rate:  [60-94] 94 (10/06 0900) Cardiac Rhythm:  [-] Normal sinus rhythm (10/06 0800) Resp:  [12-20] 14 (10/06 0900) BP: (87-114)/(53-82) 114/82 mmHg (10/06 0900) SpO2:  [95 %-100 %] 98 % (10/06 0900)  CBC:  Recent Labs Lab 12/20/14 1300 12/21/14 0335 12/22/14 0423  WBC 10.1 5.8 4.3  NEUTROABS 6.8  --   --   HGB 17.6* 14.1 12.8*  HCT 51.7 40.7 37.7*  MCV 85.2 84.3 86.1  PLT 189 115* 126*    Basic Metabolic Panel:  Recent Labs Lab 12/21/14 0335 12/22/14 0423  NA 133* 134*  K 3.0* 3.4*  CL 107 104  CO2 19* 22  GLUCOSE 110* 101*  BUN 10 <5*  CREATININE 1.12 0.97  CALCIUM 8.2* 8.2*  MG  --  1.8    Lipid Panel:    Component Value Date/Time   CHOL 113 07/22/2014 1129   CHOL 199 01/04/2013 1206   TRIG 104 07/22/2014 1129   TRIG 113 01/04/2013 1206   HDL 38* 07/22/2014 1129   HDL 47 01/04/2013 1206   CHOLHDL 3.0 07/22/2014 1129    VLDL 21 07/22/2014 1129   LDLCALC 54 07/22/2014 1129   LDLCALC 129* 01/04/2013 1206   HgbA1c:  Lab Results  Component Value Date   HGBA1C 5.6 02/13/2014   Urine Drug Screen:    Component Value Date/Time   LABOPIA NONE DETECTED 02/12/2014 1900   COCAINSCRNUR NONE DETECTED 02/12/2014 1900   LABBENZ NONE DETECTED 02/12/2014 1900   AMPHETMU NONE DETECTED 02/12/2014 1900   THCU NONE DETECTED 02/12/2014 1900   LABBARB NONE DETECTED 02/12/2014 1900      IMAGING  Ct Head Wo Contrast 12/21/2014    Chronic RIGHT MCA territory infarct.  No features to suggest an acute LEFT hemisphere cerebral vascular accident. No interval change compared with yesterday's exam.   12/21/2014   1. No acute intracranial pathology seen on CT. 2. Chronic right MCA territory infarct, with associated encephalomalacia and ex vacuo dilatation of the right lateral ventricle. 3. Small vessel ischemic microangiopathy and mild cortical volume loss. 4. Mild mucosal thickening at the maxillary sinuses bilaterally.     Dg Chest Portable 1 View 12/20/2014    Low lung volumes with bibasilar atelectasis. Possible subtle infiltrate at the left lung base.   Electronically Signed   By: Aletta Edouard M.D.   On: 12/20/2014 13:43    PHYSICAL EXAM Pleasant middle aged male not in distress. . Afebrile.  Head is nontraumatic. Neck is supple without bruit.    Cardiac exam no murmur or gallop. Lungs are clear to auscultation. Distal pulses are well felt. Neurological Exam :  Awake alert oriented 3. Diminished attention registration and recall. And dysarthria but can be easily understood. No aphasia. Extraocular movements are pharyngeal or nystagmus. Slight decreased blink to threat on the left compared to the right. Fundi were not visualized. Vision acuity seems adequate. Left lower facial weakness. Tongue midline. Motor system exam reveals mild left upper extremity drift. 3/5 strength in left upper extremity with weakness of left grip and  intrinsic hand muscles. 4/5 strength in the left lower extremity with mild weakness of left hip flexors and ankle dorsiflexors. Tone is increased on the left compared to the right. Reflexes are brisker on the left compared to the right. Sensation is preserved bilaterally left plantar is upgoing right is downgoing. Gait was not tested. ASSESSMENT/PLAN [name] is a 64 y.o. male with history of HTN, hyperlipidemia, CAD s/p stenting and CABG, MI, ischemic cardiomyopathy, previous stroke resulting in left facial droop and left hemiplegia who developed right upper lip numbness, right sided weakness and dysarthria in the hospital. He did not receive IV t-PA due to being on Xarelto.   L brain TIA  Resultant  Neuro symptoms resolved. 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  Repeat CT  No acute stroke  CT angiogram neck  August 2016 with chronic filling defect left vertebral artery, intracranial atherosclerosis  2D Echo  June 2016 with EF 40-45%, akinesis and scarring of apical myocardium, no source of embolus  HgbA1c 5.7 01/2014  Has loop recorder, placed 02/2014 by [facility]  TEE 02/2014  Xarelto for VTE prophylaxis  Diet regular Room service appropriate?: Yes; Fluid consistency:: Thin  xarelto ( rivaroxaban) prior to admission, now on xarelto ( rivaroxaban)  Ongoing aggressive stroke risk factor management  Therapy recommendations:  SNF  Disposition:  pending   Hypertension  Slightly low  Hyperlipidemia  Home meds:  Crestor 40, resumed in hospital  Continue statin at discharge  Other Stroke Risk Factors  Advanced age  Cigarette smoker, advised to stop smoking  ETOH use   Hx stroke/TIA - 01/2014 right MCA territory d/t abrupt occlusion right M1, no source found   Coronary artery disease - s/p CABG, NSTEMI w/ stent 06/2011, MI x4 prior to 2006  Daily HA  Obstructive sleep apnea, on CPAP at home, noncompliant   Other Active  Problems  HCAP  ARF  Dehydration  Bilat PE on xarelto  Chronic constipation  NOTHING FURTHER TO ADD FROM THE STROKE STANDPOINT  Patient has a 10-15% risk of having another stroke over the next year, the highest risk is within 2 weeks of the most recent stroke/TIA (risk of having a stroke following a stroke or TIA is the same).  Ongoing risk factor control by Primary Care Physician  Stroke Service will sign off. Please call should any needs arise.  Follow-up Stroke Clinic at [facility] with Dr. Antony Contras in 2 months, order placed.  Hospital day # [place] for Pager information 12/22/2014 4:16 PM  I have personally examined this patient, reviewed notes, independently viewed imaging studies, participated in medical decision making and plan of care. I have made any additions or clarifications directly to the above note. Agree with note above. Patient presented with a left brain TIA and remains at risk for neurological worsening, recurrent stroke, TIA and  needs ongoing evaluation and aggressive risk factor control. I had a long discussion with the patient with regards to lack of data suggesting switching from Xarelto to eliquis or Pradaxa being superior for stroke prevention and hence recommend he stay on Xarelto  Antony Contras, [place] Pager: [phone] 12/22/2014 4:38 PM    To contact Stroke Continuity provider, please refer to [email or web address] After hours, contact General Neurology

## 2014-12-22 NOTE — Progress Notes (Signed)
[facility] TEAM 1 - Stepdown/ICU TEAM Progress Note  Gregory Wiley EQA:[id number] DOB: 1950-12-27 DOA: 12/20/2014 PCP: [name], MD  Admit HPI / Brief Narrative: 64 year old WM PMHx Depression, HTN, HLD, ischemic cardiomyopathy, Hx multiple MI, OSA on CPAP, CAD native artery, CVA 10 months prior, Tubular Adenoma of colon, Basal Cell Carcinoma left ankle  Presented to the ER because of lightheadedness and orthostatic hypotension requiring fluid resuscitation with 3 L of fluid. Patient also endorsed decreased appetite, nausea, and constipation. Patient has also had a dry nonproductive cough several days, and chest x-ray was concerning for left lower lobe pneumonia. Lactic acid was elevated at 3.17.  HPI/Subjective: 10/6 A/O 4,, negative CP, negative SOB, negative HA, negative N/V. 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 Patient states ambulated to bathroom today without symptoms of hypotension.   Assessment/Plan: LLL HCAP w/ sepsis -Cont empiric abx tx - remains hypotensive, but clinically appears stable  -Physiotherapy vest TID -Mucinex DM BID -Flutter valve -Start steroid in the a.m. after cortisol and ACTH drawn -PCXR in a.m.  OSA -CPAP per respiratory  Hypotension - persisting orthostatic sx Pt reports subacute/chronic hx of presyncopal sx w/ positional changes - he is persistently hypotensive  - check A.m. cortisol, A.m. ACTH, TSH  -Orthostatic vitals in the a.m. -Continue normal saline 75 ml/hr  Chronic combined systolic and diastolic CHF/ischemic Cardiomyopathy  -TTE June 2016 noted EF 40-45% and grade 1 DD, improved from 25-30% Jan 2016 -CAD s/p CABG x4 2012 -Strict in and out -Daily a.m. 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  Anticoag continues   Acute renal failure -Improving rapidly w/ volume expansion - follow trend   Hypokalemia  -Potassium goal > 4   - K-Dur 40 mEq  Hypomagnesemia -Magnesium goal> 2 -Magnesium oxide 400 mg  Chronic constipation   Goals of care -After lengthy discussion with patient and wife on the difference between DO NOT RESUSCITATE and full code patient stated that for multiple years he had been DO NOT RESUSCITATE and we should have had this recorded in our EMR -Will make patient DO NOT RESUSCITATE   Code Status: DO NOT RESUSCITATE Family Communication: Wife present at time of exam Disposition Plan: Resolution sepsis    Consultants: Dr.Pramod Clydene Fake, (stroke team)  Procedure/Significant Events:    Culture 10/4 blood right arm/hand NGTD 10/4 MRSA by PCR negative 10/4 urine negative 10/4 strep pneumo urine antigen negative 10/4 HIV negative 10/6 sputum pending  Antibiotics:  Fortaz 10/4>> Vancomycin 10/4>> stopped 10/5   DVT prophylaxis: Xarelto   Devices    LINES / TUBES:      Continuous Infusions: . sodium chloride 125 mL/hr at 12/22/14 1302    Objective: VITAL SIGNS: Temp: 97.4 F (36.3 C) (10/06 1618) Temp Source: Oral (10/06 1618) BP: 98/61 mmHg (10/06 1800) Pulse Rate: 72 (10/06 1800) SPO2; FIO2:   Intake/Output Summary (Last 24 hours) at 12/22/14 2019 Last data filed at 12/22/14 2017  Gross per 24 hour  Intake 2837.08 ml  Output   2400 ml  Net 437.08 ml     Exam: General: A/O 4, NAD, No acute respiratory distress Eyes: Negative headache, eye pain, double vision,negative scleral hemorrhage ENT: Negative Runny nose, negative ear pain, negative gingival bleeding, Neck:  Negative scars, masses, torticollis, lymphadenopathy, JVD Lungs: Clear to auscultation bilaterally without wheezes or crackles Cardiovascular: Regular rate and rhythm without murmur gallop or rub normal S1 and  S2 Abdomen:negative abdominal pain, nondistended, positive soft, bowel sounds, no rebound, no ascites, no appreciable mass Extremities: No significant cyanosis, clubbing, or edema bilateral lower extremities Psychiatric:  Negative depression, negative anxiety, negative fatigue, negative mania  Neurologic:  Cranial nerves II through XII intact, tongue/uvula midline, all extremities muscle strength 5/5 (except for LUE paralysis), sensation intact throughout,  negative dysarthria, negative expressive aphasia, negative receptive aphasia.   Data Reviewed: Basic Metabolic Panel:  Recent Labs Lab 12/20/14 1300 12/21/14 0335 12/22/14 0423 12/22/14 1545  NA 134* 133* 134*  --   K 3.3* 3.0* 3.4* 4.6  CL 95* 107 104  --   CO2 25 19* 22  --   GLUCOSE 123* 110* 101*  --   BUN 14 10 <5*  --   CREATININE 1.62* 1.12 0.97  --   CALCIUM 10.1 8.2* 8.2*  --   MG  --   --  1.8 1.8   Liver Function Tests:  Recent Labs Lab 12/20/14 1300 12/22/14 0423  AST 45* 31  ALT 43 30  ALKPHOS 97 67  BILITOT 1.3* 0.4  PROT 7.8 5.2*  ALBUMIN 3.4* 2.2*   No results for input(s): LIPASE, AMYLASE in the last 168 hours. No results for input(s): AMMONIA in the last 168 hours. CBC:  Recent Labs Lab 12/20/14 1300 12/21/14 0335 12/22/14 0423  WBC 10.1 5.8 4.3  NEUTROABS 6.8  --   --   HGB 17.6* 14.1 12.8*  HCT 51.7 40.7 37.7*  MCV 85.2 84.3 86.1  PLT 189 115* 126*   Cardiac Enzymes: No results for input(s): CKTOTAL, CKMB, CKMBINDEX, TROPONINI in the last 168 hours. BNP (last 3 results)  Recent Labs  04/11/14 2310  BNP 103.3*    ProBNP (last 3 results) No results for input(s): PROBNP in the last 8760 hours.  CBG: No results for input(s): GLUCAP in the last 168 hours.  Recent Results (from the past 240 hour(s))  Blood Culture (routine x 2)     Status: None (Preliminary result)   Collection Time: 12/20/14  1:00 PM  Result Value Ref Range Status   Specimen Description BLOOD RIGHT ARM   Final   Special Requests BOTTLES DRAWN AEROBIC AND ANAEROBIC 5CC  Final   Culture NO GROWTH 2 DAYS  Final   Report Status PENDING  Incomplete  Blood Culture (routine x 2)     Status: None (Preliminary result)   Collection Time: 12/20/14  1:30 PM  Result Value Ref Range Status   Specimen Description BLOOD RIGHT HAND  Final   Special Requests BOTTLES DRAWN AEROBIC ONLY 5ML  Final   Culture NO GROWTH 2 DAYS  Final   Report Status PENDING  Incomplete  MRSA PCR Screening     Status: None   Collection Time: 12/20/14  5:52 PM  Result Value Ref Range Status   MRSA by PCR NEGATIVE NEGATIVE Final    Comment:        The GeneXpert MRSA Assay (FDA approved for NASAL specimens only), is one component of a comprehensive MRSA colonization surveillance program. It is not intended to diagnose MRSA infection nor to guide or monitor treatment for MRSA infections.   Urine culture     Status: None   Collection Time: 12/20/14  7:14 PM  Result Value Ref Range Status   Specimen Description URINE, CATHETERIZED  Final   Special Requests NONE  Final   Culture NO GROWTH 1 DAY  Final   Report Status 12/21/2014 FINAL  Final     Studies:  Recent x-ray studies have been reviewed in detail by the Attending Physician  Scheduled Meds:  Scheduled Meds: . cefTAZidime (FORTAZ)  IV  2 g Intravenous 3 times per day  . dextromethorphan-guaiFENesin  1 tablet Oral BID  . feeding supplement (ENSURE ENLIVE)  237 mL Oral Daily  . mirtazapine  7.5 mg Oral QHS  . pantoprazole  40 mg Oral BID  . polyethylene glycol  17 g Oral BID  . potassium chloride  40 mEq Oral BID  . rivaroxaban  20 mg Oral Q supper  . rosuvastatin  40 mg Oral QHS  . senna  1 tablet Oral BID    Time spent on care of this patient: 40 mins   Angelyn Osterberg, Geraldo Docker , MD  [facility] Office  [phone] Pager [phone]  On-Call/Text Page:      [email or web address]      password [email or web address]  If 7PM-7AM, please contact  night-coverage [email or web address] Password [email or web address] 12/22/2014, 8:19 PM   LOS: 2 days   Care during the described time interval was provided by me .  I have reviewed this patient's available data, including medical history, events of note, physical examination, and all test results as part of my evaluation. I have personally reviewed and interpreted all radiology studies.   Dia Crawford, MD [phone] Pager

## 2014-12-22 NOTE — Progress Notes (Signed)
Responded to page to assist patient and wife with HCPOA. Patient was not clear on terminology and confused about what HCPOA does over against what is a full code.  I reviewed HCPOA in patient's chart with patient and his wife and they agreed that they were satisfied with document as is.  The patient wife said that patient real concern was that he did not want any chest compression or any life saving measures.  She said that in patient chat they have him as full code and that is not what he wants.  I advised them to have a discussion  with the patient's nurse and doctor about a DNR.   Patient's nurse was informed of this conversation and had had the same conversation.   Will follow as needed.

## 2014-12-22 NOTE — Progress Notes (Signed)
ANTIBIOTIC CONSULT NOTE - FOLLOW UP  Pharmacy Consult for Ceftazidime Indication: r/o sepsis/PNA  Allergies  Allergen Reactions  . Diphenhydramine Hcl Anaphylaxis    Patient Measurements: Height: 6' (182.9 cm) Weight: 203 lb 9.6 oz (92.352 kg) IBW/kg (Calculated) : 77.6  Vital Signs: Temp: 97.8 F (36.6 C) (10/06 0400) Temp Source: Axillary (10/06 0400) BP: 93/61 mmHg (10/06 0400) Pulse Rate: 72 (10/06 0400) Intake/Output from previous day: 10/05 0701 - 10/06 0700 In: 4133.8 [P.O.:780; I.V.:3053.8; IV Piggyback:300] Out: 1925 [Urine:1925] Intake/Output from this shift:    Labs:  Recent Labs  12/20/14 1300 12/21/14 0335 12/22/14 0423  WBC 10.1 5.8 4.3  HGB 17.6* 14.1 12.8*  PLT 189 115* 126*  CREATININE 1.62* 1.12 0.97   Estimated Creatinine Clearance: 83.3 mL/min (by C-G formula based on Cr of 0.97). No results for input(s): VANCOTROUGH, VANCOPEAK, VANCORANDOM, GENTTROUGH, GENTPEAK, GENTRANDOM, TOBRATROUGH, TOBRAPEAK, TOBRARND, AMIKACINPEAK, AMIKACINTROU, AMIKACIN in the last 72 hours.   Microbiology: Recent Results (from the past 720 hour(s))  Blood Culture (routine x 2)     Status: None (Preliminary result)   Collection Time: 12/20/14  1:00 PM  Result Value Ref Range Status   Specimen Description BLOOD RIGHT ARM  Final   Special Requests BOTTLES DRAWN AEROBIC AND ANAEROBIC 5CC  Final   Culture NO GROWTH < 24 HOURS  Final   Report Status PENDING  Incomplete  Blood Culture (routine x 2)     Status: None (Preliminary result)   Collection Time: 12/20/14  1:30 PM  Result Value Ref Range Status   Specimen Description BLOOD RIGHT HAND  Final   Special Requests BOTTLES DRAWN AEROBIC ONLY 5ML  Final   Culture NO GROWTH < 24 HOURS  Final   Report Status PENDING  Incomplete  MRSA PCR Screening     Status: None   Collection Time: 12/20/14  5:52 PM  Result Value Ref Range Status   MRSA by PCR NEGATIVE NEGATIVE Final    Comment:        The GeneXpert MRSA Assay  (FDA approved for NASAL specimens only), is one component of a comprehensive MRSA colonization surveillance program. It is not intended to diagnose MRSA infection nor to guide or monitor treatment for MRSA infections.   Urine culture     Status: None   Collection Time: 12/20/14  7:14 PM  Result Value Ref Range Status   Specimen Description URINE, CATHETERIZED  Final   Special Requests NONE  Final   Culture NO GROWTH 1 DAY  Final   Report Status 12/21/2014 FINAL  Final    Anti-infectives    Start     Dose/Rate Route Frequency Ordered Stop   12/22/14 1200  cefTAZidime (FORTAZ) 2 g in dextrose 5 % 50 mL IVPB     2 g 100 mL/hr over 30 Minutes Intravenous 3 times per day 12/22/14 0716     12/21/14 1400  vancomycin (VANCOCIN) IVPB 1000 mg/200 mL premix  Status:  Discontinued     1,000 mg 200 mL/hr over 60 Minutes Intravenous Every 12 hours 12/21/14 0849 12/21/14 1824   12/21/14 0230  cefTAZidime (FORTAZ) 2 g in dextrose 5 % 50 mL IVPB  Status:  Discontinued     2 g 100 mL/hr over 30 Minutes Intravenous Every 12 hours 12/20/14 1704 12/22/14 0716   12/21/14 0200  vancomycin (VANCOCIN) IVPB 750 mg/150 ml premix  Status:  Discontinued     750 mg 150 mL/hr over 60 Minutes Intravenous Every 12 hours 12/20/14 1407 12/20/14  1659   12/21/14 0200  vancomycin (VANCOCIN) IVPB 750 mg/150 ml premix  Status:  Discontinued     750 mg 150 mL/hr over 60 Minutes Intravenous Every 12 hours 12/20/14 1709 12/21/14 0849   12/20/14 1400  vancomycin (VANCOCIN) IVPB 1000 mg/200 mL premix     1,000 mg 200 mL/hr over 60 Minutes Intravenous  Once 12/20/14 1352 12/20/14 1519   12/20/14 1400  cefTAZidime (FORTAZ) 2 g in dextrose 5 % 50 mL IVPB  Status:  Discontinued     2 g 100 mL/hr over 30 Minutes Intravenous 3 times per day 12/20/14 1352 12/20/14 1704      Assessment: 26 yoM being initiated on empiric vancomycin and ceftazidime for potential HCAP/Code Sepsis. CXR was concerning for LLL PNA. Day #3 of abx  for PNA. Afebrile, WBC wnl. Lactic acid down to 1.2 from 2.3. SCr improved to 0.97, CrCl ~82ml/min. UOP is good at 0.36ml/kg/hr.  Goal of Therapy:  Resolution of infection  Plan:  Increase Ceftazidime to 2 g IV q8h Expected duration 7 days with resolution of temperature and/or normalization of WBC Monitor clinical picture, renal function F/U C&S, abx deescalation / LOT  Gearld Kerstein J 12/22/2014,7:17 AM

## 2014-12-23 ENCOUNTER — Inpatient Hospital Stay (HOSPITAL_COMMUNITY): Payer: Medicare Other

## 2014-12-23 DIAGNOSIS — A419 Sepsis, unspecified organism: Secondary | ICD-10-CM | POA: Diagnosis present

## 2014-12-23 DIAGNOSIS — I63411 Cerebral infarction due to embolism of right middle cerebral artery: Secondary | ICD-10-CM

## 2014-12-23 DIAGNOSIS — R200 Anesthesia of skin: Secondary | ICD-10-CM

## 2014-12-23 DIAGNOSIS — R202 Paresthesia of skin: Secondary | ICD-10-CM | POA: Diagnosis present

## 2014-12-23 LAB — LACTIC ACID, PLASMA: Lactic Acid, Venous: 0.9 mmol/L (ref 0.5–2.0)

## 2014-12-23 LAB — COMPREHENSIVE METABOLIC PANEL
ALT: 26 U/L (ref 17–63)
AST: 22 U/L (ref 15–41)
Albumin: 2 g/dL — ABNORMAL LOW (ref 3.5–5.0)
Alkaline Phosphatase: 61 U/L (ref 38–126)
Anion gap: 10 (ref 5–15)
BUN: <5 mg/dL — ABNORMAL LOW (ref 6–20)
CO2: 21 mmol/L — ABNORMAL LOW (ref 22–32)
Calcium: 8.6 mg/dL — ABNORMAL LOW (ref 8.9–10.3)
Chloride: 109 mmol/L (ref 101–111)
Creatinine, Ser: 0.88 mg/dL (ref 0.61–1.24)
GFR calc Af Amer: >60 mL/min (ref >60)
GFR calc non Af Amer: >60 mL/min (ref >60)
Glucose, Bld: 90 mg/dL (ref 65–99)
Potassium: 4 mmol/L (ref 3.5–5.1)
Sodium: 140 mmol/L (ref 135–145)
Total Bilirubin: 0.6 mg/dL (ref 0.3–1.2)
Total Protein: 5.2 g/dL — ABNORMAL LOW (ref 6.5–8.1)

## 2014-12-23 LAB — CBC WITH DIFFERENTIAL/PLATELET
Basophils Absolute: 0 10*3/uL (ref 0.0–0.1)
Basophils Relative: 0 %
Eosinophils Absolute: 0.2 10*3/uL (ref 0.0–0.7)
Eosinophils Relative: 4 %
HCT: 36.7 % — ABNORMAL LOW (ref 39.0–52.0)
Hemoglobin: 12.4 g/dL — ABNORMAL LOW (ref 13.0–17.0)
Lymphocytes Relative: 38 %
Lymphs Abs: 1.8 10*3/uL (ref 0.7–4.0)
MCH: 28.9 pg (ref 26.0–34.0)
MCHC: 33.8 g/dL (ref 30.0–36.0)
MCV: 85.5 fL (ref 78.0–100.0)
Monocytes Absolute: 0.5 10*3/uL (ref 0.1–1.0)
Monocytes Relative: 10 %
Neutro Abs: 2.3 10*3/uL (ref 1.7–7.7)
Neutrophils Relative %: 48 %
Platelets: 132 10*3/uL — ABNORMAL LOW (ref 150–400)
RBC: 4.29 MIL/uL (ref 4.22–5.81)
RDW: 16.2 % — ABNORMAL HIGH (ref 11.5–15.5)
WBC: 4.8 10*3/uL (ref 4.0–10.5)

## 2014-12-23 LAB — TSH: TSH: 1.035 u[IU]/mL (ref 0.350–4.500)

## 2014-12-23 LAB — MAGNESIUM: Magnesium: 1.8 mg/dL (ref 1.7–2.4)

## 2014-12-23 LAB — CORTISOL-AM, BLOOD: Cortisol - AM: 2.1 ug/dL — ABNORMAL LOW (ref 6.7–22.6)

## 2014-12-23 MED ORDER — HYDROCORTISONE NA SUCCINATE PF 100 MG IJ SOLR
100.0000 mg | Freq: Three times a day (TID) | INTRAMUSCULAR | Status: DC
  Administered 2014-12-23 – 2014-12-26 (×10): 100 mg via INTRAVENOUS
  Filled 2014-12-23 (×10): qty 2

## 2014-12-23 NOTE — Care Management Note (Signed)
Case Management Note  Patient Details  Name: Gregory Wiley MRN: [id number] Date of Birth: 12-Sep-1950  Subjective/Objective:  Pt lives with spouse, has quad cane, rolling walker, hospital bed, and BSC, is active with [facility] for PT services.  PT recommends ST-SNF for rehab but pt refuses, plans to return home with services already in place, spouse requests home health nursing as well to monitor BPs and response to meds.  Pt does not need OT - is capable of some ADLs and spouse assists with others.                 Action/Plan:  CM will continue to follow for discharge needs.             Expected Discharge Plan:  [name]  In-House Referral:  Clinical Social Work  Discharge planning Services  CM Consult  Post Acute Care Choice:  Resumption of Svcs/PTA Provider  HH Arranged:  RN, PT [facility] Agency:  [facility]  Status of Service:  In process, will continue to follow  Girard Cooter, RN 12/23/2014, 6:51 AM

## 2014-12-23 NOTE — Progress Notes (Signed)
CPT with Vest preformed. It was reported that patients HR increased to 160's during last treatment. Pt did very well this time. The patient is wearing a tele monitor with leads on chest. I question if the vest vibration may have effected HR reading earlier today. I will pass along to dayshift to monitor. Following therapy pt placed on home CPAP.

## 2014-12-23 NOTE — Care Management Important Message (Signed)
Important Message  Patient Details  Name: Gregory Wiley MRN: [id number] Date of Birth: May 30, 1950   Medicare Important Message Given:  Yes-second notification given    Nathen May 12/23/2014, 11:52 AM

## 2014-12-23 NOTE — Progress Notes (Signed)
[place] TEAM 1 - Stepdown/ICU TEAM Progress Note  Gregory Wiley JAS:[id number] DOB: 1950-06-28 DOA: 12/20/2014 PCP: [name], MD  Admit HPI / Brief Narrative: 64 year old WM PMHx Depression, HTN, HLD, ischemic cardiomyopathy, Hx multiple MI, OSA on CPAP, CAD native artery, CVA 10 months prior, Tubular Adenoma of colon, Basal Cell Carcinoma left ankle  Presented to the ER because of lightheadedness and orthostatic hypotension requiring fluid resuscitation with 3 L of fluid. Patient also endorsed decreased appetite, nausea, and constipation. Patient has also had a dry nonproductive cough several days, and chest x-ray was concerning for left lower lobe pneumonia. Lactic acid was elevated at 3.17.  HPI/Subjective: 10/7 A/O 4,, negative CP, negative SOB, negative HA, negative N/V. 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    Assessment/Plan: LLL HCAP w/ sepsis -Cont empiric abx tx; complete 7 day course of antibiotics -Physiotherapy vest TID -Mucinex DM BID -Flutter valve -PCXR; pneumonia improving  OSA -Continue CPAP per respiratory  Hypotension - persisting orthostatic sx Pt reports subacute/chronic hx of presyncopal sx w/ positional changes - he is persistently hypotensive  - A.m. cortisol, C/W adrenal insufficient, ACTH pending - TSH WNL -Orthostatic vitals in the a.m. 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 cortisol low, ACTH pending -Solu-Cortef 100 mg TID -Obtain MRI brain, pituitary abnormality? -Obtain CT scan abdomen and pelvis adrenal abnormality?  Hypokalemia  -Potassium goal > 4   - K-Dur 40 mEq  Hypomagnesemia -Magnesium goal> 2 -Magnesium oxide 400 mg  Chronic constipation   Goals of care -After lengthy discussion with patient and wife on the difference between DO NOT RESUSCITATE and full code patient stated that for multiple years he had been DO NOT RESUSCITATE and we should have had this recorded in our EMR -Will make patient DO NOT RESUSCITATE   Code Status: DO NOT RESUSCITATE Family Communication: Wife present at time of exam Disposition Plan: Resolution sepsis    Consultants: Dr.Pramod Clydene Fake, (stroke team)  Procedure/Significant Events: 10/7 PCXR; LLL infiltrate improving   Culture 10/4 blood right arm/hand NGTD 10/4 MRSA by PCR negative 10/4 urine negative 10/4 strep pneumo urine antigen negative 10/4 HIV negative 10/6 sputum pending  Antibiotics:  Fortaz 10/4>> Vancomycin 10/4>> stopped 10/5   DVT prophylaxis: Xarelto   Devices    LINES / TUBES:      Continuous Infusions: . sodium chloride 75 mL/hr at 12/23/14 1014    Objective: VITAL SIGNS: Temp: 98.1 F (36.7 C) (10/07 1534) Temp Source: Oral (10/07 1534) BP: 100/68 mmHg (10/07 1534) Pulse Rate: 85 (10/07 1534) SPO2; FIO2:   Intake/Output Summary (Last 24 hours) at 12/23/14 2014 Last data filed at 12/23/14 1840  Gross per 24 hour  Intake 1641.67 ml  Output   3475 ml  Net -1833.33 ml     Exam: General: A/O 4, NAD, No  acute respiratory distress Eyes: Negative headache, eye pain, double vision,negative scleral hemorrhage ENT: Negative Runny nose, negative ear pain, negative gingival bleeding, Neck:  Negative scars, masses,  torticollis, lymphadenopathy, JVD Lungs: Clear to auscultation bilaterally without wheezes or crackles Cardiovascular: Regular rate and rhythm without murmur gallop or rub normal S1 and S2 Abdomen:negative abdominal pain, nondistended, positive soft, bowel sounds, no rebound, no ascites, no appreciable mass Extremities: No significant cyanosis, clubbing, or edema bilateral lower extremities Psychiatric:  Negative depression, negative anxiety, negative fatigue, negative mania  Neurologic:  Cranial nerves II through XII intact, tongue/uvula midline, all extremities muscle strength 5/5 (except for LUE paralysis), sensation intact throughout,  negative dysarthria, negative expressive aphasia, negative receptive aphasia.   Data Reviewed: Basic Metabolic Panel:  Recent Labs Lab 12/20/14 1300 12/21/14 0335 12/22/14 0423 12/22/14 1545 12/23/14 0320  NA 134* 133* 134*  --  140  K 3.3* 3.0* 3.4* 4.6 4.0  CL 95* 107 104  --  109  CO2 25 19* 22  --  21*  GLUCOSE 123* 110* 101*  --  90  BUN 14 10 <5*  --  <5*  CREATININE 1.62* 1.12 0.97  --  0.88  CALCIUM 10.1 8.2* 8.2*  --  8.6*  MG  --   --  1.8 1.8 1.8   Liver Function Tests:  Recent Labs Lab 12/20/14 1300 12/22/14 0423 12/23/14 0320  AST 45* 31 22  ALT 43 30 26  ALKPHOS 97 67 61  BILITOT 1.3* 0.4 0.6  PROT 7.8 5.2* 5.2*  ALBUMIN 3.4* 2.2* 2.0*   No results for input(s): LIPASE, AMYLASE in the last 168 hours. No results for input(s): AMMONIA in the last 168 hours. CBC:  Recent Labs Lab 12/20/14 1300 12/21/14 0335 12/22/14 0423 12/23/14 0320  WBC 10.1 5.8 4.3 4.8  NEUTROABS 6.8  --   --  2.3  HGB 17.6* 14.1 12.8* 12.4*  HCT 51.7 40.7 37.7* 36.7*  MCV 85.2 84.3 86.1 85.5  PLT 189 115* 126* 132*   Cardiac Enzymes: No results for input(s): CKTOTAL, CKMB, CKMBINDEX, TROPONINI in the last 168 hours. BNP (last 3 results)  Recent Labs  04/11/14 2310  BNP 103.3*    ProBNP (last 3 results) No results for input(s):  PROBNP in the last 8760 hours.  CBG: No results for input(s): GLUCAP in the last 168 hours.  Recent Results (from the past 240 hour(s))  Blood Culture (routine x 2)     Status: None (Preliminary result)   Collection Time: 12/20/14  1:00 PM  Result Value Ref Range Status   Specimen Description BLOOD RIGHT ARM  Final   Special Requests BOTTLES DRAWN AEROBIC AND ANAEROBIC 5CC  Final   Culture NO GROWTH 3 DAYS  Final   Report Status PENDING  Incomplete  Blood Culture (routine x 2)     Status: None (Preliminary result)   Collection Time: 12/20/14  1:30 PM  Result Value Ref Range Status   Specimen Description BLOOD RIGHT HAND  Final   Special Requests BOTTLES DRAWN AEROBIC ONLY 5ML  Final   Culture NO GROWTH 3 DAYS  Final   Report Status PENDING  Incomplete  MRSA PCR Screening     Status: None   Collection Time: 12/20/14  5:52 PM  Result Value Ref Range Status   MRSA by PCR NEGATIVE NEGATIVE Final    Comment:        The GeneXpert MRSA Assay (FDA approved for NASAL specimens only), is one component of a comprehensive  MRSA colonization surveillance program. It is not intended to diagnose MRSA infection nor to guide or monitor treatment for MRSA infections.   Urine culture     Status: None   Collection Time: 12/20/14  7:14 PM  Result Value Ref Range Status   Specimen Description URINE, CATHETERIZED  Final   Special Requests NONE  Final   Culture NO GROWTH 1 DAY  Final   Report Status 12/21/2014 FINAL  Final     Studies:  Recent x-ray studies have been reviewed in detail by the Attending Physician  Scheduled Meds:  Scheduled Meds: . cefTAZidime (FORTAZ)  IV  2 g Intravenous 3 times per day  . dextromethorphan-guaiFENesin  1 tablet Oral BID  . feeding supplement (ENSURE ENLIVE)  237 mL Oral Daily  . hydrocortisone sod succinate (SOLU-CORTEF) inj  100 mg Intravenous 3 times per day  . mirtazapine  7.5 mg Oral QHS  . pantoprazole  40 mg Oral BID  . polyethylene glycol  17  g Oral BID  . rivaroxaban  20 mg Oral Q supper  . rosuvastatin  40 mg Oral QHS  . senna  1 tablet Oral BID    Time spent on care of this patient: 40 mins   WOODS, Geraldo Docker , MD  [facility] Office  [phone] Pager [phone]  On-Call/Text Page:      [email or web address]      password [email or web address]  If 7PM-7AM, please contact night-coverage [email or web address] Password [email or web address] 12/23/2014, 8:14 PM   LOS: 3 days   Care during the described time interval was provided by me .  I have reviewed this patient's available data, including medical history, events of note, physical examination, and all test results as part of my evaluation. I have personally reviewed and interpreted all radiology studies.   Dia Crawford, MD [phone] Pager

## 2014-12-23 NOTE — Progress Notes (Signed)
PT Cancellation Note  Patient Details Name: Gregory Wiley MRN: [id number] DOB: November 09, 1950   Cancelled Treatment:    Reason Eval/Treat Not Completed: Other (comment) (Pt HR to 200 bpm during chest vest with RT, then to MRI).    Irwin Brakeman F 12/23/2014, 1:23 PM [place] Chayse Gracey,PT Acute Rehabilitation [phone] (pager)

## 2014-12-24 DIAGNOSIS — N179 Acute kidney failure, unspecified: Secondary | ICD-10-CM

## 2014-12-24 DIAGNOSIS — R627 Adult failure to thrive: Secondary | ICD-10-CM

## 2014-12-24 DIAGNOSIS — I5042 Chronic combined systolic (congestive) and diastolic (congestive) heart failure: Secondary | ICD-10-CM

## 2014-12-24 DIAGNOSIS — K5900 Constipation, unspecified: Secondary | ICD-10-CM

## 2014-12-24 DIAGNOSIS — I2699 Other pulmonary embolism without acute cor pulmonale: Secondary | ICD-10-CM

## 2014-12-24 DIAGNOSIS — J189 Pneumonia, unspecified organism: Secondary | ICD-10-CM

## 2014-12-24 DIAGNOSIS — Z7901 Long term (current) use of anticoagulants: Secondary | ICD-10-CM

## 2014-12-24 DIAGNOSIS — G8194 Hemiplegia, unspecified affecting left nondominant side: Secondary | ICD-10-CM

## 2014-12-24 LAB — CBC WITH DIFFERENTIAL/PLATELET
Basophils Absolute: 0 10*3/uL (ref 0.0–0.1)
Basophils Relative: 0 %
Eosinophils Absolute: 0 10*3/uL (ref 0.0–0.7)
Eosinophils Relative: 0 %
HCT: 33.9 % — ABNORMAL LOW (ref 39.0–52.0)
Hemoglobin: 11.4 g/dL — ABNORMAL LOW (ref 13.0–17.0)
Lymphocytes Relative: 19 %
Lymphs Abs: 0.8 10*3/uL (ref 0.7–4.0)
MCH: 28.7 pg (ref 26.0–34.0)
MCHC: 33.6 g/dL (ref 30.0–36.0)
MCV: 85.4 fL (ref 78.0–100.0)
Monocytes Absolute: 0.2 10*3/uL (ref 0.1–1.0)
Monocytes Relative: 4 %
Neutro Abs: 3.3 10*3/uL (ref 1.7–7.7)
Neutrophils Relative %: 77 %
Platelets: 116 10*3/uL — ABNORMAL LOW (ref 150–400)
RBC: 3.97 MIL/uL — ABNORMAL LOW (ref 4.22–5.81)
RDW: 16.1 % — ABNORMAL HIGH (ref 11.5–15.5)
WBC: 4.3 10*3/uL (ref 4.0–10.5)

## 2014-12-24 LAB — COMPREHENSIVE METABOLIC PANEL
ALT: 20 U/L (ref 17–63)
AST: 18 U/L (ref 15–41)
Albumin: 2 g/dL — ABNORMAL LOW (ref 3.5–5.0)
Alkaline Phosphatase: 60 U/L (ref 38–126)
Anion gap: 6 (ref 5–15)
BUN: <5 mg/dL — ABNORMAL LOW (ref 6–20)
CO2: 23 mmol/L (ref 22–32)
Calcium: 8.4 mg/dL — ABNORMAL LOW (ref 8.9–10.3)
Chloride: 110 mmol/L (ref 101–111)
Creatinine, Ser: 0.79 mg/dL (ref 0.61–1.24)
GFR calc Af Amer: >60 mL/min (ref >60)
GFR calc non Af Amer: >60 mL/min (ref >60)
Glucose, Bld: 178 mg/dL — ABNORMAL HIGH (ref 65–99)
Potassium: 3.4 mmol/L — ABNORMAL LOW (ref 3.5–5.1)
Sodium: 139 mmol/L (ref 135–145)
Total Bilirubin: 0.5 mg/dL (ref 0.3–1.2)
Total Protein: 5.3 g/dL — ABNORMAL LOW (ref 6.5–8.1)

## 2014-12-24 LAB — MAGNESIUM: Magnesium: 1.7 mg/dL (ref 1.7–2.4)

## 2014-12-24 MED ORDER — COSYNTROPIN 0.25 MG IJ SOLR
0.2500 mg | Freq: Once | INTRAMUSCULAR | Status: AC
  Administered 2014-12-25: 0.25 mg via INTRAVENOUS
  Filled 2014-12-24: qty 0.25

## 2014-12-24 MED ORDER — MINERAL OIL RE ENEM
1.0000 | ENEMA | Freq: Once | RECTAL | Status: AC
  Administered 2014-12-24: 1 via RECTAL
  Filled 2014-12-24: qty 1

## 2014-12-24 NOTE — Progress Notes (Signed)
[place] TEAM 1 - Stepdown/ICU TEAM Progress Note  EDU ON KLK:[id number] DOB: 1950-07-20 DOA: 12/20/2014 PCP: [name], MD  Admit HPI / Brief Narrative: 64 year old WM PMHx Depression, HTN, HLD, ischemic cardiomyopathy, Hx multiple MI, OSA on CPAP, CAD native artery, CVA 10 months prior, Tubular Adenoma of colon, Basal Cell Carcinoma left ankle  Presented to the ER because of lightheadedness and orthostatic hypotension requiring fluid resuscitation with 3 L of fluid. Patient also endorsed decreased appetite, nausea, and constipation. Patient has also had a dry nonproductive cough several days, and chest x-ray was concerning for left lower lobe pneumonia. Lactic acid was elevated at 3.17.  HPI/Subjective: Laying in bed chronically ill appearing, denies pain, no sob, reported no bm for three weeks. 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    Assessment/Plan: LLL HCAP w/ sepsis -Cont empiric abx tx; complete 7 day course of antibiotics -Physiotherapy vest TID -Mucinex DM BID -Flutter valve -PCXR; pneumonia improving  OSA -Continue CPAP per respiratory  Hypotension - persisting orthostatic sx Pt reports subacute/chronic hx of presyncopal sx w/ positional changes - he is persistently hypotensive  - A.m. cortisol, C/W adrenal insufficient, ACTH pending - TSH WNL -Orthostatic vitals stable -received normal saline since admission, d/c ivf on 10/8.  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 Weight 10/7 weight bed= 97.3 kg -Watch for fluid overload, d/c ivf.  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 cortisol low, ACTH /cosyntropin test pending -Solu-Cortef 100 mg TID -Obtain MRI brain, no pituitary abnormality -Obtain CT scan abdomen and pelvis no adrenal abnormality  Hypokalemia  -Potassium goal > 4   - K-Dur 40 mEq  Hypomagnesemia -Magnesium goal> 2 -Magnesium oxide 400 mg  Chronic constipation   Reported no bm for three weeks despite stool softener, enema ordered.  Code Status: DO NOT RESUSCITATE, confirmed by Dr. Sherral Hammers Family Communication: patient Disposition Plan: will benefit from LTAC/SNF, but patient wants to go home    Consultants: Dr.Pramod Clydene Fake, (stroke team)  Procedure/Significant Events: 10/7 PCXR; LLL infiltrate improving   Culture 10/4 blood right arm/hand NGTD 10/4 MRSA by PCR negative 10/4 urine negative 10/4 strep pneumo urine antigen negative 10/4 HIV negative 10/6 sputum pending  Antibiotics:  Fortaz 10/4>> Vancomycin 10/4>> stopped 10/5   DVT prophylaxis: Xarelto   Devices    LINES / TUBES:      Continuous Infusions:    Objective: VITAL SIGNS: Temp: 98.2 F (36.8 C) (10/08 1628) Temp Source: Oral (10/08 1628) BP: 104/63 mmHg (10/08 1628) Pulse Rate: 62 (10/08 1628) SPO2; FIO2:   Intake/Output Summary (Last 24 hours) at 12/24/14 1658 Last data filed at 12/24/14 1629  Gross per 24 hour  Intake    660 ml  Output   1150 ml  Net   -490 ml     Exam: General: A/O 4, NAD, No acute respiratory distress Eyes: Negative headache, eye pain, double vision,negative scleral hemorrhage ENT: Negative Runny nose, negative ear pain, negative gingival  bleeding, Neck:  Negative scars, masses, torticollis, lymphadenopathy, JVD Lungs: Clear to auscultation bilaterally without wheezes or crackles Cardiovascular: Regular rate and rhythm  without murmur gallop or rub normal S1 and S2 Abdomen:negative abdominal pain, nondistended, positive soft, bowel sounds, no rebound, no ascites, no appreciable mass Extremities: No significant cyanosis, clubbing, or edema bilateral lower extremities Psychiatric:  Negative depression, negative anxiety, negative fatigue, negative mania  Neurologic:  Cranial nerves II through XII intact, tongue/uvula midline, extremities on right side muscle strength 5/5 , chronic left hemiplegia, sensation intact throughout,  negative dysarthria, negative expressive aphasia, negative receptive aphasia.   Data Reviewed: Basic Metabolic Panel:  Recent Labs Lab 12/20/14 1300 12/21/14 0335 12/22/14 0423 12/22/14 1545 12/23/14 0320 12/24/14 0521  NA 134* 133* 134*  --  140 139  K 3.3* 3.0* 3.4* 4.6 4.0 3.4*  CL 95* 107 104  --  109 110  CO2 25 19* 22  --  21* 23  GLUCOSE 123* 110* 101*  --  90 178*  BUN 14 10 <5*  --  <5* <5*  CREATININE 1.62* 1.12 0.97  --  0.88 0.79  CALCIUM 10.1 8.2* 8.2*  --  8.6* 8.4*  MG  --   --  1.8 1.8 1.8 1.7   Liver Function Tests:  Recent Labs Lab 12/20/14 1300 12/22/14 0423 12/23/14 0320 12/24/14 0521  AST 45* 31 22 18   ALT 43 30 26 20   ALKPHOS 97 67 61 60  BILITOT 1.3* 0.4 0.6 0.5  PROT 7.8 5.2* 5.2* 5.3*  ALBUMIN 3.4* 2.2* 2.0* 2.0*   No results for input(s): LIPASE, AMYLASE in the last 168 hours. No results for input(s): AMMONIA in the last 168 hours. CBC:  Recent Labs Lab 12/20/14 1300 12/21/14 0335 12/22/14 0423 12/23/14 0320 12/24/14 0521  WBC 10.1 5.8 4.3 4.8 4.3  NEUTROABS 6.8  --   --  2.3 3.3  HGB 17.6* 14.1 12.8* 12.4* 11.4*  HCT 51.7 40.7 37.7* 36.7* 33.9*  MCV 85.2 84.3 86.1 85.5 85.4  PLT 189 115* 126* 132* 116*   Cardiac Enzymes: No results for input(s): CKTOTAL, CKMB, CKMBINDEX, TROPONINI in the last 168 hours. BNP (last 3 results)  Recent Labs  04/11/14 2310  BNP 103.3*    ProBNP (last 3 results) No results for input(s):  PROBNP in the last 8760 hours.  CBG: No results for input(s): GLUCAP in the last 168 hours.  Recent Results (from the past 240 hour(s))  Blood Culture (routine x 2)     Status: None (Preliminary result)   Collection Time: 12/20/14  1:00 PM  Result Value Ref Range Status   Specimen Description BLOOD RIGHT ARM  Final   Special Requests BOTTLES DRAWN AEROBIC AND ANAEROBIC 5CC  Final   Culture NO GROWTH 4 DAYS  Final   Report Status PENDING  Incomplete  Blood Culture (routine x 2)     Status: None (Preliminary result)   Collection Time: 12/20/14  1:30 PM  Result Value Ref Range Status   Specimen Description BLOOD RIGHT HAND  Final   Special Requests BOTTLES DRAWN AEROBIC ONLY 5ML  Final   Culture NO GROWTH 4 DAYS  Final   Report Status PENDING  Incomplete  MRSA PCR Screening     Status: None   Collection Time: 12/20/14  5:52 PM  Result Value Ref Range Status   MRSA by PCR NEGATIVE NEGATIVE Final    Comment:        The GeneXpert MRSA Assay (FDA approved for NASAL  specimens only), is one component of a comprehensive MRSA colonization surveillance program. It is not intended to diagnose MRSA infection nor to guide or monitor treatment for MRSA infections.   Urine culture     Status: None   Collection Time: 12/20/14  7:14 PM  Result Value Ref Range Status   Specimen Description URINE, CATHETERIZED  Final   Special Requests NONE  Final   Culture NO GROWTH 1 DAY  Final   Report Status 12/21/2014 FINAL  Final     Studies:  Recent x-ray studies have been reviewed in detail by the Attending Physician  Scheduled Meds:  Scheduled Meds: . cefTAZidime (FORTAZ)  IV  2 g Intravenous 3 times per day  . [START ON 12/25/2014] cosyntropin  0.25 mg Intravenous Once  . dextromethorphan-guaiFENesin  1 tablet Oral BID  . feeding supplement (ENSURE ENLIVE)  237 mL Oral Daily  . hydrocortisone sod succinate (SOLU-CORTEF) inj  100 mg Intravenous 3 times per day  . mineral oil  1 enema Rectal  Once  . mirtazapine  7.5 mg Oral QHS  . pantoprazole  40 mg Oral BID  . polyethylene glycol  17 g Oral BID  . rivaroxaban  20 mg Oral Q supper  . rosuvastatin  40 mg Oral QHS  . senna  1 tablet Oral BID    Time spent on care of this patient: 65 mins   Conchetta Lamia , MD PhD  [facility] Office  [phone] Pager - [phone]  On-Call/Text Page:      [email or web address]      password [email or web address]  If 7PM-7AM, please contact night-coverage [email or web address] Password [email or web address] 12/24/2014, 4:58 PM   LOS: 4 days

## 2014-12-25 DIAGNOSIS — G8192 Hemiplegia, unspecified affecting left dominant side: Secondary | ICD-10-CM

## 2014-12-25 DIAGNOSIS — E2740 Unspecified adrenocortical insufficiency: Secondary | ICD-10-CM

## 2014-12-25 DIAGNOSIS — E876 Hypokalemia: Secondary | ICD-10-CM

## 2014-12-25 LAB — COMPREHENSIVE METABOLIC PANEL
ALT: 24 U/L (ref 17–63)
AST: 27 U/L (ref 15–41)
Albumin: 2.3 g/dL — ABNORMAL LOW (ref 3.5–5.0)
Alkaline Phosphatase: 58 U/L (ref 38–126)
Anion gap: 7 (ref 5–15)
BUN: <5 mg/dL — ABNORMAL LOW (ref 6–20)
CO2: 25 mmol/L (ref 22–32)
Calcium: 8.6 mg/dL — ABNORMAL LOW (ref 8.9–10.3)
Chloride: 104 mmol/L (ref 101–111)
Creatinine, Ser: 0.83 mg/dL (ref 0.61–1.24)
GFR calc Af Amer: >60 mL/min (ref >60)
GFR calc non Af Amer: >60 mL/min (ref >60)
Glucose, Bld: 131 mg/dL — ABNORMAL HIGH (ref 65–99)
Potassium: 3.2 mmol/L — ABNORMAL LOW (ref 3.5–5.1)
Sodium: 136 mmol/L (ref 135–145)
Total Bilirubin: 0.2 mg/dL — ABNORMAL LOW (ref 0.3–1.2)
Total Protein: 5.5 g/dL — ABNORMAL LOW (ref 6.5–8.1)

## 2014-12-25 LAB — URINALYSIS, ROUTINE W REFLEX MICROSCOPIC
Bilirubin Urine: NEGATIVE
Glucose, UA: 100 mg/dL — AB
Ketones, ur: NEGATIVE mg/dL
Leukocytes, UA: NEGATIVE
Nitrite: NEGATIVE
Protein, ur: NEGATIVE mg/dL
Specific Gravity, Urine: <1.005 — ABNORMAL LOW (ref 1.005–1.030)
Urobilinogen, UA: 0.2 mg/dL (ref 0.0–1.0)
pH: 6.5 (ref 5.0–8.0)

## 2014-12-25 LAB — CBC WITH DIFFERENTIAL/PLATELET
Basophils Absolute: 0 10*3/uL (ref 0.0–0.1)
Basophils Relative: 0 %
Eosinophils Absolute: 0 10*3/uL (ref 0.0–0.7)
Eosinophils Relative: 0 %
HCT: 37.4 % — ABNORMAL LOW (ref 39.0–52.0)
Hemoglobin: 12.8 g/dL — ABNORMAL LOW (ref 13.0–17.0)
Lymphocytes Relative: 13 %
Lymphs Abs: 1.1 10*3/uL (ref 0.7–4.0)
MCH: 29.2 pg (ref 26.0–34.0)
MCHC: 34.2 g/dL (ref 30.0–36.0)
MCV: 85.2 fL (ref 78.0–100.0)
Monocytes Absolute: 0.6 10*3/uL (ref 0.1–1.0)
Monocytes Relative: 6 %
Neutro Abs: 7 10*3/uL (ref 1.7–7.7)
Neutrophils Relative %: 81 %
Platelets: 143 10*3/uL — ABNORMAL LOW (ref 150–400)
RBC: 4.39 MIL/uL (ref 4.22–5.81)
RDW: 16.2 % — ABNORMAL HIGH (ref 11.5–15.5)
WBC: 8.6 10*3/uL (ref 4.0–10.5)

## 2014-12-25 LAB — CULTURE, BLOOD (ROUTINE X 2)
Culture: NO GROWTH
Culture: NO GROWTH

## 2014-12-25 LAB — ACTH STIMULATION, 3 TIME POINTS
Cortisol, 30 Min: 34.8 ug/dL
Cortisol, 60 Min: 33.2 ug/dL
Cortisol, Base: 45.8 ug/dL

## 2014-12-25 LAB — URINE MICROSCOPIC-ADD ON

## 2014-12-25 LAB — URIC ACID: Uric Acid, Serum: 1.7 mg/dL — ABNORMAL LOW (ref 4.4–7.6)

## 2014-12-25 LAB — MAGNESIUM: Magnesium: 1.9 mg/dL (ref 1.7–2.4)

## 2014-12-25 MED ORDER — POTASSIUM CHLORIDE CRYS ER 20 MEQ PO TBCR
40.0000 meq | EXTENDED_RELEASE_TABLET | Freq: Once | ORAL | Status: AC
  Administered 2014-12-25: 40 meq via ORAL
  Filled 2014-12-25: qty 2

## 2014-12-25 MED ORDER — SODIUM CHLORIDE 0.9 % IV SOLN
INTRAVENOUS | Status: DC
  Administered 2014-12-25 – 2014-12-26 (×2): via INTRAVENOUS

## 2014-12-25 MED ORDER — SODIUM CHLORIDE 1 G PO TABS
1.0000 g | ORAL_TABLET | Freq: Two times a day (BID) | ORAL | Status: DC
  Administered 2014-12-26 – 2014-12-27 (×2): 1 g via ORAL
  Filled 2014-12-25 (×6): qty 1

## 2014-12-25 MED ORDER — SORBITOL 70 % SOLN
960.0000 mL | TOPICAL_OIL | Freq: Once | ORAL | Status: AC
  Administered 2014-12-25: 960 mL via RECTAL
  Filled 2014-12-25: qty 240

## 2014-12-25 MED ORDER — MAGNESIUM CITRATE PO SOLN
1.0000 | Freq: Once | ORAL | Status: AC
  Administered 2014-12-25: 1 via ORAL
  Filled 2014-12-25: qty 296

## 2014-12-25 NOTE — Progress Notes (Addendum)
Patient still has no success of BM after the mineral oil enema given. Per patient "I take Mag citrate at home when I get constipation". RN  talked it over with patient that it will communicated for the mag citrate to be ordered.

## 2014-12-25 NOTE — Evaluation (Addendum)
Occupational Therapy Evaluation Patient Details Name: Gregory Wiley MRN: [id number] DOB: 26-Oct-1950 Today's Date: 12/25/2014    History of Present Illness 64 y.o. male past medical history of hypertension, CAD status post CABG in 2012, chronic combined CHF dyslipidemia, CVA with residual left hemiparesis presented to the emergency department with the chief complaint of ongoing weakness, dizziness.  Initial evaluation in the emergency department revealed a chest x-ray concerning for healthcare associated pneumonia, acute kidney injury, hypotension, tachycardia. Patient reported persistent generalized weakness and dizziness particularly with position change over the last several weeks. He states worsening over the last 3 days PTA. Code stroke was called on 10/5 and CT was negative for acute infarct.   Clinical Impression   Pt admitted with above. Pt requiring assist with ADLs, PTA. Feel pt will benefit from acute OT to increase independence and strength prior to d/c.     Follow Up Recommendations  Home health OT;Supervision/Assistance - 24 hour    Equipment Recommendations  None recommended by OT    Recommendations for Other Services       Precautions / Restrictions Precautions Precautions: Fall      Mobility Bed Mobility Overal bed mobility: Needs Assistance Bed Mobility: Supine to Sit     Supine to sit: Min assist     General bed mobility comments: assist with trunk.  Transfers Overall transfer level: Needs assistance Equipment used: Quad cane Transfers: Sit to/from Stand Sit to Stand: Min guard              Balance    LOB when ambulating-Min assist.                                         ADL Overall ADL's : Needs assistance/impaired     Grooming: Brushing hair;Sitting;Minimal assistance (able to brush hair without physical assist)               Lower Body Dressing: Maximal assistance;Sit to/from stand   Toilet Transfer: Minimal  assistance;Ambulation (used quad cane; Min guard with LOB 1x requiring assist)           Functional mobility during ADLs: Minimal assistance (Min guard and had LOB 1x; used quad cane) General ADL Comments: Educated on AE and pt tried using sockaid.      Vision     Perception     Praxis      Pertinent Vitals/Pain Pain Assessment: No/denies pain     Hand Dominance     Extremity/Trunk Assessment Upper Extremity Assessment Upper Extremity Assessment: LUE deficits/detail LUE Deficits / Details: residual weakness in LUE from previous stroke-able to actively move elbow some; pt able to straighten fingers passively-suggested him wearing his splint he has at home and explained positioning of left hand that he should do and explained why. LUE Coordination: decreased fine motor;decreased gross motor   Lower Extremity Assessment Lower Extremity Assessment: Defer to PT evaluation       Communication Communication Communication: No difficulties   Cognition Arousal/Alertness: Awake/alert Behavior During Therapy: WFL for tasks assessed/performed Overall Cognitive Status: Within Functional Limits for tasks assessed                     General Comments       Exercises       Shoulder Instructions      Home Living Family/patient expects to be discharged to:: Private residence Living  Arrangements: Spouse/significant other Available Help at Discharge: Family (most of the time) Type of Home: House Home Access: Ramped entrance     Home Layout: One level     Bathroom Shower/Tub: Tub/shower unit;Curtain Shower/tub characteristics: Architectural technologist: Standard Bathroom Accessibility: No   Home Equipment: Cane - quad;Wheelchair - manual;Wheelchair - power;Grab bars - tub/shower;Tub bench;Adaptive equipment;Bedside commode Adaptive Equipment: Reacher;Long-handled shoe horn;Other (Comment) (long handled brush) Additional Comments: Wife has MS, provides all necessary  assistance for patient at home.      Prior Functioning/Environment Level of Independence: Needs assistance  Gait / Transfers Assistance Needed: Uses quad cane at home with assistance from wife via guarding and gait belt. ADL's / Homemaking Assistance Needed: Needs assist from wife for bathing/dressing        OT Diagnosis: Generalized weakness   OT Problem List: Decreased strength;Decreased knowledge of use of DME or AE;Impaired balance (sitting and/or standing);Decreased coordination;Decreased knowledge of precautions;Impaired UE functional use;Decreased range of motion   OT Treatment/Interventions: Self-care/ADL training;DME and/or AE instruction;Therapeutic exercise;Therapeutic activities;Patient/family education;Balance training    OT Goals(Current goals can be found in the care plan section) Acute Rehab OT Goals Patient Stated Goal: not stated OT Goal Formulation: With patient Time For Goal Achievement: 01/01/15 Potential to Achieve Goals: Good ADL Goals Pt Will Perform Lower Body Bathing: sit to/from stand;with adaptive equipment;with min guard assist Pt Will Perform Lower Body Dressing: with adaptive equipment;sit to/from stand;with mod assist Pt Will Transfer to Toilet: ambulating;with supervision Pt Will Perform Toileting - Clothing Manipulation and hygiene: sit to/from stand;with min guard assist  OT Frequency: Min 2X/week   Barriers to D/C:            Co-evaluation              End of Session Equipment Utilized During Treatment: Gait belt;Other (comment) (quad cane)  Activity Tolerance: Patient tolerated treatment well Patient left: in bed;with family/visitor present;Other (comment) (lab in room )   Time: 1344-1401 OT Time Calculation (min): 17 min Charges:  OT General Charges $OT Visit: 1 Procedure OT Evaluation $Initial OT Evaluation Tier I: 1 Procedure G-CodesBenito Mccreedy OTR/L [phone] 12/25/2014, 2:17 PM

## 2014-12-25 NOTE — Progress Notes (Signed)
ANTIBIOTIC CONSULT NOTE - FOLLOW UP  Pharmacy Consult for Ceftazidime Indication: r/o sepsis/PNA  Allergies  Allergen Reactions  . Diphenhydramine Hcl Anaphylaxis    Patient Measurements: Height: 6' (182.9 cm) Weight: 212 lb 15.4 oz (96.6 kg) IBW/kg (Calculated) : 77.6  Vital Signs: Temp: 97.7 F (36.5 C) (10/09 0519) Temp Source: Oral (10/09 0519) BP: 102/57 mmHg (10/09 0519) Pulse Rate: 65 (10/09 0519) Intake/Output from previous day: 10/08 0701 - 10/09 0700 In: 660 [P.O.:660] Out: 1150 [Urine:1150] Intake/Output from this shift:    Labs:  Recent Labs  12/23/14 0320 12/24/14 0521 12/25/14 0755  WBC 4.8 4.3 8.6  HGB 12.4* 11.4* 12.8*  PLT 132* 116* 143*  CREATININE 0.88 0.79 0.83   Estimated Creatinine Clearance: 106.9 mL/min (by C-G formula based on Cr of 0.83). No results for input(s): VANCOTROUGH, VANCOPEAK, VANCORANDOM, GENTTROUGH, GENTPEAK, GENTRANDOM, TOBRATROUGH, TOBRAPEAK, TOBRARND, AMIKACINPEAK, AMIKACINTROU, AMIKACIN in the last 72 hours.   Microbiology: Recent Results (from the past 720 hour(s))  Blood Culture (routine x 2)     Status: None (Preliminary result)   Collection Time: 12/20/14  1:00 PM  Result Value Ref Range Status   Specimen Description BLOOD RIGHT ARM  Final   Special Requests BOTTLES DRAWN AEROBIC AND ANAEROBIC 5CC  Final   Culture NO GROWTH 4 DAYS  Final   Report Status PENDING  Incomplete  Blood Culture (routine x 2)     Status: None (Preliminary result)   Collection Time: 12/20/14  1:30 PM  Result Value Ref Range Status   Specimen Description BLOOD RIGHT HAND  Final   Special Requests BOTTLES DRAWN AEROBIC ONLY 5ML  Final   Culture NO GROWTH 4 DAYS  Final   Report Status PENDING  Incomplete  MRSA PCR Screening     Status: None   Collection Time: 12/20/14  5:52 PM  Result Value Ref Range Status   MRSA by PCR NEGATIVE NEGATIVE Final    Comment:        The GeneXpert MRSA Assay (FDA approved for NASAL specimens only), is one  component of a comprehensive MRSA colonization surveillance program. It is not intended to diagnose MRSA infection nor to guide or monitor treatment for MRSA infections.   Urine culture     Status: None   Collection Time: 12/20/14  7:14 PM  Result Value Ref Range Status   Specimen Description URINE, CATHETERIZED  Final   Special Requests NONE  Final   Culture NO GROWTH 1 DAY  Final   Report Status 12/21/2014 FINAL  Final    Anti-infectives    Start     Dose/Rate Route Frequency Ordered Stop   12/22/14 1200  cefTAZidime (FORTAZ) 2 g in dextrose 5 % 50 mL IVPB     2 g 100 mL/hr over 30 Minutes Intravenous 3 times per day 12/22/14 0716     12/21/14 1400  vancomycin (VANCOCIN) IVPB 1000 mg/200 mL premix  Status:  Discontinued     1,000 mg 200 mL/hr over 60 Minutes Intravenous Every 12 hours 12/21/14 0849 12/21/14 1824   12/21/14 0230  cefTAZidime (FORTAZ) 2 g in dextrose 5 % 50 mL IVPB  Status:  Discontinued     2 g 100 mL/hr over 30 Minutes Intravenous Every 12 hours 12/20/14 1704 12/22/14 0716   12/21/14 0200  vancomycin (VANCOCIN) IVPB 750 mg/150 ml premix  Status:  Discontinued     750 mg 150 mL/hr over 60 Minutes Intravenous Every 12 hours 12/20/14 1407 12/20/14 1659   12/21/14 0200  vancomycin (VANCOCIN) IVPB 750 mg/150 ml premix  Status:  Discontinued     750 mg 150 mL/hr over 60 Minutes Intravenous Every 12 hours 12/20/14 1709 12/21/14 0849   12/20/14 1400  vancomycin (VANCOCIN) IVPB 1000 mg/200 mL premix     1,000 mg 200 mL/hr over 60 Minutes Intravenous  Once 12/20/14 1352 12/20/14 1519   12/20/14 1400  cefTAZidime (FORTAZ) 2 g in dextrose 5 % 50 mL IVPB  Status:  Discontinued     2 g 100 mL/hr over 30 Minutes Intravenous 3 times per day 12/20/14 1352 12/20/14 1704      Assessment: 65 yoM was initiated on empiric vancomycin and ceftazidime for potential HCAP/Code Sepsis. CXR was concerning for LLL PNA. Vancomycin was d/c'd on 10/05. Now day #6 of abx for PNA.  Afebrile, WBC wnl. Lactic acid down to 0.9 from 2.3. SCr improved to 0.83, CrCl ~169ml/min.  Goal of Therapy:  Resolution of infection  Plan:  Continue Ceftazidime to 2 g IV q8h Expected duration 7 days with resolution of temperature and/or normalization of WBC Monitor clinical picture, renal function F/U C&S, abx deescalation / LOT   Melburn Popper, PharmD Clinical Pharmacy Resident Pager: [phone] 12/25/2014 10:42 AM

## 2014-12-25 NOTE — Progress Notes (Signed)
Patient placed on Home CPAP. No O2 bleed in. Patient tolerating well. RT will continue to monitor as needed.

## 2014-12-25 NOTE — Progress Notes (Signed)
Hospitalist  Progress Note  Gregory Wiley SAY:[id number] DOB: [birth year] DOA: 12/20/2014 PCP: [name], MD  Admit HPI / Brief Narrative: 64 year old WM PMHx Depression, HTN, HLD, ischemic cardiomyopathy, Hx multiple MI, OSA on CPAP, CAD native artery, CVA 10 months prior, Tubular Adenoma of colon, Basal Cell Carcinoma left ankle  Presented to the ER because of lightheadedness and orthostatic hypotension requiring fluid resuscitation with 3 L of fluid. Patient also endorsed decreased appetite, nausea, and constipation. Patient has also had a dry nonproductive cough several days, and chest x-ray was concerning for left lower lobe pneumonia. Lactic acid was elevated at 3.17.  HPI/Subjective: Reported feeling better today, walked twice, denies dizziness, still no BM, blood pressure still soft, ivf stopped on 10/8.  Denies pain, no cough, no sob, wife in room.  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    Assessment/Plan:  LLL HCAP w/ sepsis -Cont empiric abx tx; complete 7 day course of antibiotics, last dose 10/10. -Physiotherapy vest TID -Mucinex DM BID -Flutter valve -PCXR; pneumonia improving  OSA -Continue CPAP per respiratory  Hypotension - persisting orthostatic sx Pt reports subacute/chronic hx of presyncopal sx w/ positional changes - he is persistently hypotensive  - A.m. cortisol, C/W adrenal insufficient, ACTH pending - TSH WNL -Orthostatic vitals stable -received normal saline since admission, d/c ivf on 10/8.  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 Weight 10/7 weight bed= 97.3 kg -Watch for fluid overload, d/c ivf.  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 cortisol low, ACTH /cosyntropin test pending -Solu-Cortef 100 mg TID -Obtain MRI brain, no pituitary abnormality -Obtain CT scan abdomen and pelvis no adrenal abnormality  Hypokalemia  -Potassium goal > 4   - K-Dur 40 mEq  Hypomagnesemia -Magnesium goal> 2 -Magnesium oxide 400 mg  Chronic constipation   Reported no bm for three weeks despite stool softener, enema ordered.  Code Status: DO NOT RESUSCITATE, confirmed by Dr. Sherral Hammers Family Communication: patient and wife at bedside Disposition Plan: will benefit from LTAC/SNF, but patient wants to go home    Consultants: Dr.Pramod Clydene Fake, (stroke team)  Procedure/Significant Events: 10/7 PCXR; LLL infiltrate improving   Culture 10/4 blood right arm/hand NGTD 10/4 MRSA by PCR negative 10/4 urine negative 10/4 strep pneumo urine antigen negative 10/4 HIV negative 10/6 sputum pending  Antibiotics:  Fortaz 10/4>> Vancomycin 10/4>> stopped 10/5   DVT prophylaxis: Xarelto   Devices    LINES / TUBES:      Continuous Infusions:    Objective: VITAL SIGNS: Temp: 97.9 F (36.6 C) (10/09 1430) Temp Source: Oral (10/09 1430) BP: 88/48 mmHg (10/09 1430) Pulse Rate: 78 (10/09 1430) SPO2; FIO2:   Intake/Output Summary (Last 24 hours) at 12/25/14 1452 Last data filed at 12/25/14 1430  Gross per 24 hour  Intake    960 ml  Output   1050 ml  Net    -90 ml     Exam: General: A/O 4, NAD, No acute respiratory distress  Eyes: Negative headache, eye pain, double vision,negative scleral hemorrhage ENT: Negative Runny nose, negative ear pain, negative gingival bleeding, Neck:  Negative scars, masses, torticollis, lymphadenopathy, JVD Lungs: Clear to  auscultation bilaterally without wheezes or crackles Cardiovascular: Regular rate and rhythm without murmur gallop or rub normal S1 and S2 Abdomen:negative abdominal pain, nondistended, positive soft, bowel sounds, no rebound, no ascites, no appreciable mass Extremities: No significant cyanosis, clubbing, or edema bilateral lower extremities Psychiatric:  Negative depression, negative anxiety, negative fatigue, negative mania  Neurologic:  Cranial nerves II through XII intact, tongue/uvula midline, extremities on right side muscle strength 5/5 , chronic left hemiplegia, sensation intact throughout,  negative dysarthria, negative expressive aphasia, negative receptive aphasia.   Data Reviewed: Basic Metabolic Panel:  Recent Labs Lab 12/21/14 0335 12/22/14 0423 12/22/14 1545 12/23/14 0320 12/24/14 0521 12/25/14 0755  NA 133* 134*  --  140 139 136  K 3.0* 3.4* 4.6 4.0 3.4* 3.2*  CL 107 104  --  109 110 104  CO2 19* 22  --  21* 23 25  GLUCOSE 110* 101*  --  90 178* 131*  BUN 10 <5*  --  <5* <5* <5*  CREATININE 1.12 0.97  --  0.88 0.79 0.83  CALCIUM 8.2* 8.2*  --  8.6* 8.4* 8.6*  MG  --  1.8 1.8 1.8 1.7 1.9   Liver Function Tests:  Recent Labs Lab 12/20/14 1300 12/22/14 0423 12/23/14 0320 12/24/14 0521 12/25/14 0755  AST 45* 31 22 18 27   ALT 43 30 26 20 24   ALKPHOS 97 67 61 60 58  BILITOT 1.3* 0.4 0.6 0.5 0.2*  PROT 7.8 5.2* 5.2* 5.3* 5.5*  ALBUMIN 3.4* 2.2* 2.0* 2.0* 2.3*   No results for input(s): LIPASE, AMYLASE in the last 168 hours. No results for input(s): AMMONIA in the last 168 hours. CBC:  Recent Labs Lab 12/20/14 1300 12/21/14 0335 12/22/14 0423 12/23/14 0320 12/24/14 0521 12/25/14 0755  WBC 10.1 5.8 4.3 4.8 4.3 8.6  NEUTROABS 6.8  --   --  2.3 3.3 7.0  HGB 17.6* 14.1 12.8* 12.4* 11.4* 12.8*  HCT 51.7 40.7 37.7* 36.7* 33.9* 37.4*  MCV 85.2 84.3 86.1 85.5 85.4 85.2  PLT 189 115* 126* 132* 116* 143*   Cardiac Enzymes: No results for input(s):  CKTOTAL, CKMB, CKMBINDEX, TROPONINI in the last 168 hours. BNP (last 3 results)  Recent Labs  04/11/14 2310  BNP 103.3*    ProBNP (last 3 results) No results for input(s): PROBNP in the last 8760 hours.  CBG: No results for input(s): GLUCAP in the last 168 hours.  Recent Results (from the past 240 hour(s))  Blood Culture (routine x 2)     Status: None   Collection Time: 12/20/14  1:00 PM  Result Value Ref Range Status   Specimen Description BLOOD RIGHT ARM  Final   Special Requests BOTTLES DRAWN AEROBIC AND ANAEROBIC 5CC  Final   Culture NO GROWTH 5 DAYS  Final   Report Status 12/25/2014 FINAL  Final  Blood Culture (routine x 2)     Status: None   Collection Time: 12/20/14  1:30 PM  Result Value Ref Range Status   Specimen Description BLOOD RIGHT HAND  Final   Special Requests BOTTLES DRAWN AEROBIC ONLY 5ML  Final   Culture NO GROWTH 5 DAYS  Final   Report Status 12/25/2014 FINAL  Final  MRSA PCR Screening     Status: None   Collection Time: 12/20/14  5:52 PM  Result Value  Ref Range Status   MRSA by PCR NEGATIVE NEGATIVE Final    Comment:        The GeneXpert MRSA Assay (FDA approved for NASAL specimens only), is one component of a comprehensive MRSA colonization surveillance program. It is not intended to diagnose MRSA infection nor to guide or monitor treatment for MRSA infections.   Urine culture     Status: None   Collection Time: 12/20/14  7:14 PM  Result Value Ref Range Status   Specimen Description URINE, CATHETERIZED  Final   Special Requests NONE  Final   Culture NO GROWTH 1 DAY  Final   Report Status 12/21/2014 FINAL  Final     Studies:  Recent x-ray studies have been reviewed in detail by the Attending Physician  Scheduled Meds:  Scheduled Meds: . cefTAZidime (FORTAZ)  IV  2 g Intravenous 3 times per day  . dextromethorphan-guaiFENesin  1 tablet Oral BID  . feeding supplement (ENSURE ENLIVE)  237 mL Oral Daily  . hydrocortisone sod succinate  (SOLU-CORTEF) inj  100 mg Intravenous 3 times per day  . mirtazapine  7.5 mg Oral QHS  . pantoprazole  40 mg Oral BID  . polyethylene glycol  17 g Oral BID  . rivaroxaban  20 mg Oral Q supper  . rosuvastatin  40 mg Oral QHS  . senna  1 tablet Oral BID  . sorbitol, milk of mag, mineral oil, glycerin (SMOG) enema  960 mL Rectal Once    Time spent on care of this patient: 28 mins   Albena Comes , MD PhD  [facility] Office  [phone] Pager - [phone]  On-Call/Text Page:      [email or web address]      password [email or web address]  If 7PM-7AM, please contact night-coverage [email or web address] Password [email or web address] 12/25/2014, 2:52 PM   LOS: 5 days

## 2014-12-26 ENCOUNTER — Inpatient Hospital Stay (HOSPITAL_COMMUNITY): Payer: Medicare Other

## 2014-12-26 DIAGNOSIS — E860 Dehydration: Secondary | ICD-10-CM

## 2014-12-26 LAB — COMPREHENSIVE METABOLIC PANEL
ALT: 33 U/L (ref 17–63)
AST: 31 U/L (ref 15–41)
Albumin: 2.1 g/dL — ABNORMAL LOW (ref 3.5–5.0)
Alkaline Phosphatase: 51 U/L (ref 38–126)
Anion gap: 6 (ref 5–15)
BUN: 7 mg/dL (ref 6–20)
CO2: 28 mmol/L (ref 22–32)
Calcium: 8.7 mg/dL — ABNORMAL LOW (ref 8.9–10.3)
Chloride: 105 mmol/L (ref 101–111)
Creatinine, Ser: 0.81 mg/dL (ref 0.61–1.24)
GFR calc Af Amer: >60 mL/min (ref >60)
GFR calc non Af Amer: >60 mL/min (ref >60)
Glucose, Bld: 131 mg/dL — ABNORMAL HIGH (ref 65–99)
Potassium: 3.4 mmol/L — ABNORMAL LOW (ref 3.5–5.1)
Sodium: 139 mmol/L (ref 135–145)
Total Bilirubin: 0.4 mg/dL (ref 0.3–1.2)
Total Protein: 5.2 g/dL — ABNORMAL LOW (ref 6.5–8.1)

## 2014-12-26 LAB — CBC WITH DIFFERENTIAL/PLATELET
Basophils Absolute: 0 10*3/uL (ref 0.0–0.1)
Basophils Relative: 0 %
Eosinophils Absolute: 0 10*3/uL (ref 0.0–0.7)
Eosinophils Relative: 0 %
HCT: 35.6 % — ABNORMAL LOW (ref 39.0–52.0)
Hemoglobin: 12.1 g/dL — ABNORMAL LOW (ref 13.0–17.0)
Lymphocytes Relative: 16 %
Lymphs Abs: 1.4 10*3/uL (ref 0.7–4.0)
MCH: 29.2 pg (ref 26.0–34.0)
MCHC: 34 g/dL (ref 30.0–36.0)
MCV: 86 fL (ref 78.0–100.0)
Monocytes Absolute: 0.6 10*3/uL (ref 0.1–1.0)
Monocytes Relative: 6 %
Neutro Abs: 7.1 10*3/uL (ref 1.7–7.7)
Neutrophils Relative %: 78 %
Platelets: 166 10*3/uL (ref 150–400)
RBC: 4.14 MIL/uL — ABNORMAL LOW (ref 4.22–5.81)
RDW: 16.6 % — ABNORMAL HIGH (ref 11.5–15.5)
WBC: 9.1 10*3/uL (ref 4.0–10.5)

## 2014-12-26 LAB — MAGNESIUM: Magnesium: 2.2 mg/dL (ref 1.7–2.4)

## 2014-12-26 LAB — ACTH: C206 ACTH: 8 pg/mL (ref 7.2–63.3)

## 2014-12-26 MED ORDER — POTASSIUM CHLORIDE CRYS ER 20 MEQ PO TBCR
40.0000 meq | EXTENDED_RELEASE_TABLET | Freq: Once | ORAL | Status: AC
  Administered 2014-12-26: 40 meq via ORAL
  Filled 2014-12-26: qty 2

## 2014-12-26 MED ORDER — SODIUM CHLORIDE 0.9 % IV BOLUS (SEPSIS): 1000.0000 mL | INTRAVENOUS | Status: DC | PRN

## 2014-12-26 MED ORDER — PREDNISONE 10 MG PO TABS
10.0000 mg | ORAL_TABLET | Freq: Every day | ORAL | Status: DC
  Administered 2014-12-27: 10 mg via ORAL
  Filled 2014-12-26: qty 1

## 2014-12-26 MED ORDER — FLUDROCORTISONE ACETATE 0.1 MG PO TABS
0.1000 mg | ORAL_TABLET | Freq: Two times a day (BID) | ORAL | Status: DC
  Administered 2014-12-26 – 2014-12-27 (×3): 0.1 mg via ORAL
  Filled 2014-12-26 (×5): qty 1

## 2014-12-26 MED ORDER — SODIUM CHLORIDE 0.9 % IV BOLUS (SEPSIS)
1000.0000 mL | Freq: Once | INTRAVENOUS | Status: AC
  Administered 2014-12-26: 1000 mL via INTRAVENOUS

## 2014-12-26 MED ORDER — POLYETHYLENE GLYCOL 3350 17 G PO PACK: 17.0000 g | PACK | Freq: Every day | ORAL | Status: DC | PRN

## 2014-12-26 MED ORDER — GADOBENATE DIMEGLUMINE 529 MG/ML IV SOLN
20.0000 mL | Freq: Once | INTRAVENOUS | Status: AC | PRN
  Administered 2014-12-26: 20 mL via INTRAVENOUS

## 2014-12-26 MED ORDER — MIDODRINE HCL 5 MG PO TABS
2.5000 mg | ORAL_TABLET | Freq: Three times a day (TID) | ORAL | Status: DC
  Administered 2014-12-26 – 2014-12-27 (×4): 2.5 mg via ORAL
  Filled 2014-12-26 (×4): qty 1

## 2014-12-26 NOTE — Telephone Encounter (Signed)
Faxed additional paperwork to [facility] as instructed.

## 2014-12-26 NOTE — Progress Notes (Addendum)
Hospitalist  Progress Note  Gregory Wiley JGG:[id number] DOB: 04-24-1950 DOA: 12/20/2014 PCP: [name], MD  Admit HPI / Brief Narrative: 64 year old WM PMHx Depression, HTN, HLD, ischemic cardiomyopathy, Hx multiple MI, OSA on CPAP, CAD native artery, CVA 10 months prior, Tubular Adenoma of colon, Basal Cell Carcinoma left ankle  Presented to the ER because of lightheadedness and orthostatic hypotension requiring fluid resuscitation with 3 L of fluid. Gregory Wiley also endorsed decreased appetite, nausea, and constipation. Gregory Wiley has also had a dry nonproductive cough several days, and chest x-ray was concerning for left lower lobe pneumonia. Lactic acid was elevated at 3.17.  HPI/Subjective:  Reported feeling better today, had bm after enema, bp remains low, back on fluids.  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    Assessment/Plan:  Hypotension - persisting orthostatic sx-adrenal insufficiency Pt reports subacute/chronic hx of presyncopal sx w/ positional changes - he is persistently hypotensive  Per chart review, he was recently hospitalized for hypotension, all his previous home bp meds was discontinued at the time.  - TSH WNL -Orthostatic vitals stable -received normal saline since admission, d/c ivf on 10/8. Restarted ivf on 10/9 due to persistent hypotension. Started salt tabs and midodrine, abdominal binder and lower extremity compression stocking.  low A.m. Cortisol  C/W adrenal insufficient, ACTH low normal, cosyntropin test no appropriate increase of cortisol. More consistent with primary adrenal insufficiency, testosterone level pending, mri brain/CT ab no pituitary, no adrenal mass. discussed with endocrinology Dr. Renato Shin, recommended  Florinef/prednisone and outpatient follow up with him closely. -will repeat echocardiogram due to persistent hypotension. No other source of infection, finished pna treatment.   LLL HCAP w/ sepsis -Cont empiric abx tx; complete 7  day course of antibiotics, last dose 10/10. -Physiotherapy vest TID -Mucinex DM BID -Flutter valve -PCXR; improved, lung clear, no cough, no chest pain , on room air. -all culture negative. cxr improving, ct ab no source of infection.  OSA -Continue CPAP per respiratory  Right femoral cyst: orthopedic Dr. Percell Miller consulted, input appreciated.    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 -PT/OT/home health, Gregory Wiley refused SNF.  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 Responded to second enema.  Code Status: DO NOT RESUSCITATE, confirmed by Dr. Sherral Hammers Family Communication: Gregory Wiley and wife at bedside Disposition Plan: will benefit from LTAC/SNF, but Gregory Wiley wants to go home    Consultants: Dr.Pramod Clydene Fake, (stroke team)  Procedure/Significant Events: 10/7 PCXR; LLL infiltrate improving   Culture 10/4 blood right arm/hand NGTD 10/4 MRSA by PCR negative 10/4 urine negative 10/4 strep pneumo urine antigen negative 10/4 HIV negative 10/6 sputum pending  Antibiotics:  Tressie Ellis 10/4>> Vancomycin 10/4>> stopped 10/5   DVT prophylaxis: Xarelto  Continuous Infusions: .  sodium chloride 75 mL/hr at 12/25/14 2220    Objective: VITAL SIGNS: Temp: 97.5 F (36.4 C) (10/10 0549) Temp Source: Oral (10/10 0549) BP: 94/62 mmHg (10/10 0556) Pulse Rate: 58 (10/10 0549) SPO2; FIO2:   Intake/Output Summary (Last 24 hours) at 12/26/14 1348 Last data filed at 12/26/14 0955  Gross per 24 hour  Intake 1533.75 ml  Output    800 ml  Net 733.75 ml     Exam: General: A/O 4, NAD, No acute respiratory distress Eyes: Negative headache, eye pain, double vision,negative scleral hemorrhage ENT: Negative Runny nose, negative ear pain, negative gingival bleeding, Neck:  Negative scars, masses, torticollis, lymphadenopathy, JVD Lungs: Clear to auscultation bilaterally without wheezes or crackles Cardiovascular: Regular rate and rhythm without murmur gallop or rub normal S1 and S2 Abdomen:negative abdominal pain, nondistended, positive soft, bowel sounds, no rebound, no ascites, no appreciable mass Extremities: No significant cyanosis, clubbing, or edema bilateral lower extremities Psychiatric:  Negative depression, negative anxiety, negative fatigue, negative mania  Neurologic:  Cranial nerves II through XII intact, tongue/uvula midline, extremities on right side muscle strength 5/5 , chronic left hemiplegia, sensation intact throughout,  negative dysarthria, negative expressive aphasia, negative receptive aphasia.   Data Reviewed: Basic Metabolic Panel:  Recent Labs Lab 12/22/14 0423 12/22/14 1545 12/23/14 0320 12/24/14 0521 12/25/14 0755 12/26/14 0512  NA 134*  --  140 139 136 139  K 3.4* 4.6 4.0 3.4* 3.2* 3.4*  CL 104  --  109 110 104 105  CO2 22  --  21* 23 25 28   GLUCOSE 101*  --  90 178* 131* 131*  BUN <5*  --  <5* <5* <5* 7  CREATININE 0.97  --  0.88 0.79 0.83 0.81  CALCIUM 8.2*  --  8.6* 8.4* 8.6* 8.7*  MG 1.8 1.8 1.8 1.7 1.9 2.2   Liver Function Tests:  Recent Labs Lab 12/22/14 0423 12/23/14 0320 12/24/14 0521 12/25/14 0755 12/26/14 0512   AST 31 22 18 27 31   ALT 30 26 20 24  33  ALKPHOS 67 61 60 58 51  BILITOT 0.4 0.6 0.5 0.2* 0.4  PROT 5.2* 5.2* 5.3* 5.5* 5.2*  ALBUMIN 2.2* 2.0* 2.0* 2.3* 2.1*   No results for input(s): LIPASE, AMYLASE in the last 168 hours. No results for input(s): AMMONIA in the last 168 hours. CBC:  Recent Labs Lab 12/20/14 1300  12/22/14 0423 12/23/14 0320 12/24/14 0521 12/25/14 0755 12/26/14 0512  WBC 10.1  < > 4.3 4.8 4.3 8.6 9.1  NEUTROABS 6.8  --   --  2.3 3.3 7.0 7.1  HGB 17.6*  < > 12.8* 12.4* 11.4* 12.8* 12.1*  HCT 51.7  < > 37.7* 36.7* 33.9* 37.4* 35.6*  MCV 85.2  < > 86.1 85.5 85.4 85.2 86.0  PLT 189  < > 126* 132* 116* 143* 166  < > = values in this interval not displayed. Cardiac Enzymes: No results for input(s): CKTOTAL, CKMB, CKMBINDEX, TROPONINI in the last 168 hours. BNP (last 3 results)  Recent Labs  04/11/14 2310  BNP 103.3*    ProBNP (last 3 results) No results for input(s): PROBNP in the last 8760 hours.  CBG: No results for input(s): GLUCAP in the last 168 hours.  Recent Results (from the past 240 hour(s))  Blood Culture (routine x 2)     Status: None   Collection Time: 12/20/14  1:00 PM  Result Value Ref Range Status   Specimen Description BLOOD RIGHT ARM  Final  Special Requests BOTTLES DRAWN AEROBIC AND ANAEROBIC 5CC  Final   Culture NO GROWTH 5 DAYS  Final   Report Status 12/25/2014 FINAL  Final  Blood Culture (routine x 2)     Status: None   Collection Time: 12/20/14  1:30 PM  Result Value Ref Range Status   Specimen Description BLOOD RIGHT HAND  Final   Special Requests BOTTLES DRAWN AEROBIC ONLY 5ML  Final   Culture NO GROWTH 5 DAYS  Final   Report Status 12/25/2014 FINAL  Final  MRSA PCR Screening     Status: None   Collection Time: 12/20/14  5:52 PM  Result Value Ref Range Status   MRSA by PCR NEGATIVE NEGATIVE Final    Comment:        The GeneXpert MRSA Assay (FDA approved for NASAL specimens only), is one component of  a comprehensive MRSA colonization surveillance program. It is not intended to diagnose MRSA infection nor to guide or monitor treatment for MRSA infections.   Urine culture     Status: None   Collection Time: 12/20/14  7:14 PM  Result Value Ref Range Status   Specimen Description URINE, CATHETERIZED  Final   Special Requests NONE  Final   Culture NO GROWTH 1 DAY  Final   Report Status 12/21/2014 FINAL  Final     Studies:  Recent x-ray studies have been reviewed in detail by the Attending Physician  Scheduled Meds:  Scheduled Meds: . cefTAZidime (FORTAZ)  IV  2 g Intravenous 3 times per day  . dextromethorphan-guaiFENesin  1 tablet Oral BID  . feeding supplement (ENSURE ENLIVE)  237 mL Oral Daily  . fludrocortisone  0.1 mg Oral BID  . midodrine  2.5 mg Oral TID WC  . mirtazapine  7.5 mg Oral QHS  . pantoprazole  40 mg Oral BID  . [START ON 12/27/2014] predniSONE  10 mg Oral Q breakfast  . rivaroxaban  20 mg Oral Q supper  . rosuvastatin  40 mg Oral QHS  . senna  1 tablet Oral BID  . sodium chloride  1 g Oral BID WC    Time spent on care of this Gregory Wiley: 48 mins   Cj Edgell , MD PhD  [facility] Office  [phone] Pager - [phone]  On-Call/Text Page:      [email or web address]      password [email or web address]  If 7PM-7AM, please contact night-coverage [email or web address] Password [email or web address] 12/26/2014, 1:48 PM   LOS: 6 days

## 2014-12-26 NOTE — Care Management Note (Signed)
Case Management Note  Patient Details  Name: PARAS KREIDER MRN: [id number] Date of Birth: 03-20-1950  Subjective/Objective:                  Patient refused SNF placement. Patient states that he lives at home with his wife, and she has been taliking care of him for the last 10 months. He states that he spent all winter in a facility last year and does not want to do that again this year. He stated that he is active with [place] for home health and declined any other CM assistance. CM placed order to resume [place] through [place] and placed referral to Loura Back RN. AVS updated.   Action/Plan:  No further CM needs at this time, anticipate DC tomorrow,    Expected Discharge Date:                  Expected Discharge Plan:  [place]  In-House Referral:  Clinical Social Work  Discharge planning Services  CM Consult  Post Acute Care Choice:  Resumption of Svcs/PTA Provider Choice offered to:     DME Arranged:    DME Agency:     HH Arranged:  RN, PT [place] Agency:  [facility]  Status of Service:  Completed, signed off  Medicare Important Message Given:  Yes-second notification given Date Medicare IM Given:    Medicare IM give by:    Date Additional Medicare IM Given:    Additional Medicare Important Message give by:     If discussed at [place] of Stay Meetings, dates discussed:    Additional Comments:  Carles Collet, RN 12/26/2014, 12:27 PM

## 2014-12-26 NOTE — Progress Notes (Addendum)
Physical Therapy Treatment Patient Details Name: Gregory Wiley MRN: [id number] DOB: [birth year] Today's Date: 12/26/2014    History of Present Illness 64 y.o. male past medical history of hypertension, CAD status post CABG in 2012, chronic combined CHF dyslipidemia, CVA with residual left hemiparesis presented to the emergency department with the chief complaint of ongoing weakness, dizziness.  Initial evaluation in the emergency department revealed a chest x-ray concerning for healthcare associated pneumonia, acute kidney injury, hypotension, tachycardia. Patient reported persistent generalized weakness and dizziness particularly with position change over the last several weeks. He states worsening over the last 3 days PTA. Code stroke was called on 10/5 and CT was negative for acute infarct.    PT Comments    Pt is progressing well with gait and mobility, but has limited endurance for increased gait distance.  He would benefit from chair to follow to help encourage gait until fatigue without the worry of having to make it back to the room.  PT will continue to progress acutely.  Wife has been physically assisting pt at home and he is getting to the point where she can handle him again.   Follow Up Recommendations  Home health PT;Supervision for mobility/OOB     Equipment Recommendations  None recommended by PT    Recommendations for Other Services   NA     Precautions / Restrictions Precautions Precautions: Fall Precaution Comments: due to left hemiperesis    Mobility  Bed Mobility Overal bed mobility: Needs Assistance Bed Mobility: Sit to Supine       Sit to supine: Mod assist   General bed mobility comments: Mod assist to help control both legs and lift them into bed.  Pt unable to bring trunk to side and almost hit his head on opposite bed rail.  Once feet in, PT assisted pt in righting trunk in the bed.   Transfers Overall transfer level: Needs assistance Equipment  used: Quad cane Transfers: Sit to/from Stand Sit to Stand: Mod assist         General transfer comment: Mod assist from low recliner chair.  "One, two, three.." momentum used to get to stainding and multiple attempts needed to be successful from lower surface.   Ambulation/Gait Ambulation/Gait assistance: Min assist Ambulation Distance (Feet): 35 Feet Assistive device: Quad cane Gait Pattern/deviations: Step-to pattern;Steppage;Decreased dorsiflexion - left;Decreased step length - left Gait velocity: decreased Gait velocity interpretation: Below normal speed for age/gender General Gait Details: L foot drag with steppage/circumducting gait pattern. Pt fatigued quickly, reports he has an AFO for this foot, but it is at home.  He would likely go further if he had chair to follow for gait.        Balance Overall balance assessment: Needs assistance Sitting-balance support: Feet supported;No upper extremity supported Sitting balance-Leahy Scale: Fair     Standing balance support: Single extremity supported Standing balance-Leahy Scale: Poor                      Cognition Arousal/Alertness: Awake/alert Behavior During Therapy: WFL for tasks assessed/performed Overall Cognitive Status: Within Functional Limits for tasks assessed                         General Comments General comments (skin integrity, edema, etc.): RN reports HR went up to 140 during gait. Not sure if reading was motion degraded or not.       Pertinent Vitals/Pain Pain Assessment: No/denies pain  12/26/14 1649  Vital Signs  Pulse Rate 83 (max 140 per RN while working with PT)  BP 111/70 mmHg  BP Location Right Arm  BP Method Automatic  Patient Position (if appropriate) Lying  Oxygen Therapy  SpO2 100 %  O2 Device Room Air           PT Goals (current goals can now be found in the care plan section) Acute Rehab PT Goals Patient Stated Goal: not stated Progress towards PT goals:  Progressing toward goals    Frequency  Min 3X/week    PT Plan Current plan remains appropriate       End of Session Equipment Utilized During Treatment: Gait belt Activity Tolerance: Patient limited by fatigue Patient left: in bed;with call bell/phone within reach;with family/visitor present;with nursing/sitter in room (transporter coming to take him to MRI)     Time: 2774-1287 PT Time Calculation (min) (ACUTE ONLY): 22 min  Charges:  $Gait Training: 8-22 mins                      Gregory Wiley B. Gregory Wiley, PT, DPT [phone]   12/26/2014, 5:14 PM

## 2014-12-26 NOTE — Consult Note (Signed)
ORTHOPAEDIC CONSULTATION  REQUESTING PHYSICIAN: Florencia Reasons, MD  Chief Complaint: Cystic lesion of the right femoral neck  HPI: Gregory Wiley is a 64 y.o. male who was in incidentally found to have a large cystic lesion of the right femoral neck measuring 9.2 x 2.7cm on CT scan.  The patient reports that he is having no pain or issues with the right hip.  He is a hemiplegic of the L side after a stroke last novemeber, so he relies on the right leg for all transfers.  He is ambulatory at baseline with the assistance of a quad cane. MRI with contrast will need to be preformed in order to further evaluate the nature of the cyst.    Past Medical History  Diagnosis Date  . Coronary artery disease     stent, then CABG 07/20/10  . Hypertension   . Hyperlipidemia   . Obesity (BMI 30-39.9)   . H/O cardiomyopathy     ischemic, now last echo 07/30/11, EF 38%W  . Diverticulosis   . Internal hemorrhoids   . Tubular adenoma of colon   . Plantar fasciitis of left foot   . Basal cell carcinoma of skin of left ankle   . NSTEMI (non-ST elevated myocardial infarction) ([place])     NSTEMI- last cath 06/2011-Stent to LCX-DES, last nuc 07/30/11 low risk  . MI (myocardial infarction) ([place])     "I had 4 before I retired in 2006" (11/11/2014)  . Pulmonary embolism ([place]) 03/2014  . OSA on CPAP     since July 2013  . GERD (gastroesophageal reflux disease)   . Bleeding stomach ulcer 2015  . Daily headache     "for the past month" (11/11/2014)  . Stroke ([place]) 01/2014    "no control of LUE, can slightly LLE; memory problems since" (11/11/2014)  . Arthritis     "back" (11/11/2014)  . Gout   . Depression    Past Surgical History  Procedure Laterality Date  . Coronary artery bypass graft  07/20/2010     LIMA-LAD; VG-ACUTE MARG of RCA; Seq VG-distal RCA & then pda  . Coronary angioplasty with stent placement  07/01/2011    DES-Resolute to native LCX  . Coronary angioplasty with stent placement  12/01/2007   COMPLEX 5 LESION PCI INCLUDING CUTTING BALLOON AND 4 CYPHER DESs  . Knee arthroscopy Right   . Hand surgery Right     to take glass out  . Colonoscopy N/A 02/10/2013    Procedure: COLONOSCOPY;  Surgeon: Ladene Artist, MD;  Location: [facility] ENDOSCOPY;  Service: Endoscopy;  Laterality: N/A;  . Retinal detachment surgery Right   . Loop recorder implant  02/15/2014    MDT LINQ implanted by Dr Rayann Heman for cryptogenic stroke  . Tee without cardioversion N/A 02/15/2014    Procedure: TRANSESOPHAGEAL ECHOCARDIOGRAM (TEE);  Surgeon: Josue Hector, MD;  Location: [facility] ENDOSCOPY;  Service: Cardiovascular;  Laterality: N/A;  . Left heart catheterization with coronary/graft angiogram  07/01/2011    Procedure: LEFT HEART CATHETERIZATION WITH Beatrix Fetters;  Surgeon: Sanda Klein, MD;  Location: [place] [facility] CATH LAB;  Service: Cardiovascular;;  . Percutaneous coronary stent intervention (pci-s) Right 07/01/2011    Procedure: PERCUTANEOUS CORONARY STENT INTERVENTION (PCI-S);  Surgeon: Sanda Klein, MD;  Location: [facility] CATH LAB;  Service: Cardiovascular;  Laterality: Right;  . Umbilical hernia repair  2006  . Hernia repair    . Basal cell carcinoma excision Left 2015    ankle  . Esophagogastroduodenoscopy  01/2014    gastric ulcer, erosive gastroduodenitis, H Pylori negative.   . Esophagogastroduodenoscopy (egd) with propofol N/A 11/14/2014    Procedure: ESOPHAGOGASTRODUODENOSCOPY (EGD) WITH PROPOFOL;  Surgeon: Milus Banister, MD;  Location: [place];  Service: Endoscopy;  Laterality: N/A;   Social History   Social History  . Marital Status: Married    Spouse Name: N/A  . Number of Children: 5  . Years of Education: N/A   Occupational History  .      works at the [employer] [place] Topics  . Smoking status: Former Smoker -- 2.00 packs/day for 4 years    Types: Cigarettes  . Smokeless tobacco: Never Used     Comment: "quit smoking cigarettes  in the 1970's"  . Alcohol Use: 0.0  oz/week    0 Standard drinks or equivalent per week     Comment: 11/11/2014 "1-2 times per year"  . Drug Use: Yes    Special: Marijuana     Comment: "smoked some pot in college"  . Sexual Activity: Not Currently   Other Topics Concern  . 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 Diabetes type II Mother   . Coronary artery disease    . Colon cancer Neg Hx   . CAD Father 68    CABG, PCI, died at 83  . Heart attack Father 38  . Hypertension Brother   . Diabetes Brother   . Hyperlipidemia Brother   . Brain cancer Maternal Grandmother   . COPD Paternal Grandfather   . Other      denies family history of DVT/PE  . Stroke Neg Hx    Allergies  Allergen Reactions  . Diphenhydramine Hcl Anaphylaxis   Prior to Admission medications   Medication Sig Start Date End Date Taking? Authorizing Provider  acetaminophen (TYLENOL) 325 MG tablet Take 2 tablets (650 mg total) by mouth every 6 (six) hours as needed for mild pain. 03/15/14  Yes Ivan Anchors Love, PA-C  ALPRAZolam (XANAX) 0.25 MG tablet Take 0.5 tablets (0.125 mg total) by mouth every 8 (eight) hours as needed for anxiety. 04/14/14  Yes Tiffany L Reed, DO  carvedilol (COREG) 6.25 MG tablet Take 0.5 tablets (3.125 mg total) by mouth 2 (two) times daily. 12/02/14  Yes [name], MD  colchicine 0.6 MG tablet Take 0.6 mg by mouth as needed (for gout).  04/09/14  Yes Historical Provider, MD  feeding supplement, ENSURE ENLIVE, (ENSURE ENLIVE) LIQD Take 237 mLs by mouth 2 (two) times daily between meals. Patient taking differently: Take 237 mLs by mouth daily.  11/16/14  Yes Shanker Kristeen Mans, MD  fluticasone (FLONASE) 50 MCG/ACT nasal spray Place 2 sprays into both nostrils daily. Patient taking differently: Place 2 sprays into both nostrils daily as needed for allergies.  03/15/14   Yes Ivan Anchors Love, PA-C  HYDROcodone-acetaminophen (NORCO/VICODIN) 5-325 MG per tablet Take one tablet up to twice daily as needed for headache. Patient taking differently: Take 1 tablet by mouth every 6 (six) hours as needed for moderate pain (headache). Take one tablet up to twice daily as needed for headache. 12/05/14  Yes Britt Bottom, MD  hydrocortisone (ANUSOL-HC) 25 MG suppository Place 1 suppository (25 mg total) rectally daily. X 1 week; then,  use as needed thereafter. Patient taking differently: Place 25 mg rectally 2 (two) times daily as needed for hemorrhoids or itching. X 1 week; then, use as needed thereafter. 07/26/14  Yes Jerene Bears, MD  imipramine (TOFRANIL) 25 MG tablet Take 1 tablet (25 mg total) by mouth at bedtime. 11/30/14  Yes Britt Bottom, MD  meclizine (ANTIVERT) 25 MG tablet Take 1 tablet (25 mg total) by mouth 3 (three) times daily as needed for dizziness. Patient taking differently: Take 25 mg by mouth 3 (three) times daily as needed for dizziness (bed-time).  03/15/14  Yes Ivan Anchors Love, PA-C  Melatonin 10 MG CAPS Take 1 capsule by mouth at bedtime as needed (sleep).    Yes Historical Provider, MD  methylphenidate (RITALIN) 10 MG tablet Take up to 3 times a day po Patient taking differently: Take 10 mg by mouth 2 (two) times daily with breakfast and lunch.  10/26/14  Yes Britt Bottom, MD  mirtazapine (REMERON) 15 MG tablet Take 0.5 tablets (7.5 mg total) by mouth at bedtime. 12/15/14  Yes Tonia Ghent, MD  nitroGLYCERIN (NITROSTAT) 0.4 MG SL tablet Place 0.4 mg under the tongue every 5 (five) minutes as needed. For chest pain   Yes Historical Provider, MD  ondansetron (ZOFRAN) 4 MG tablet Take 1 tablet (4 mg total) by mouth every 12 (twelve) hours. Call if prior approval is needed. Patient taking differently: Take 4 mg by mouth daily.  12/13/14  Yes Tonia Ghent, MD  OVER THE COUNTER MEDICATION Inhale 1 application into the lungs at bedtime. C-PAP   Yes  Historical Provider, MD  pantoprazole (PROTONIX) 40 MG tablet TAKE 1 TABLET BY MOUTH 2 TIMES A DAY. 09/15/14  Yes [name], MD  promethazine (PHENERGAN) 12.5 MG tablet Take 1-2 tablets (12.5-25 mg total) by mouth every 6 (six) hours as needed for nausea or vomiting. 10/28/14  Yes Pleas Koch, NP  rivaroxaban (XARELTO) 20 MG TABS tablet Take 1 tablet (20 mg total) by mouth daily with supper. 05/05/14  Yes Britt Bottom, MD  rosuvastatin (CRESTOR) 40 MG tablet Take 1 tablet (40 mg total) by mouth daily. Patient taking differently: Take 40 mg by mouth at bedtime.  09/21/14  Yes [name], MD  Sennosides (SENOKOT PO) Take 2 tablets by mouth daily.   Yes Historical Provider, MD  Vitamin D, Ergocalciferol, (DRISDOL) 50000 UNITS CAPS capsule Take 50,000 Units by mouth every 7 (seven) days. Take on Mondays   Yes Historical Provider, MD   No results found.  Positive ROS: All other systems have been reviewed and were otherwise negative with the exception of those mentioned in the HPI and as above.  Labs cbc  Recent Labs  12/25/14 0755 12/26/14 0512  WBC 8.6 9.1  HGB 12.8* 12.1*  HCT 37.4* 35.6*  PLT 143* 166    Labs inflam No results for input(s): CRP in the last 72 hours.  Invalid input(s): ESR  Labs coag No results for input(s): INR, PTT in the last 72 hours.  Invalid input(s): PT   Recent Labs  12/25/14 0755 12/26/14 0512  NA 136 139  K 3.2* 3.4*  CL 104 105  CO2 25 28  GLUCOSE 131* 131*  BUN <5* 7  CREATININE 0.83 0.81  CALCIUM 8.6* 8.7*    Physical Exam: Filed Vitals:   12/26/14 1357  BP: 81/44  Pulse: 67  Temp: 98.5 F (36.9 C)  Resp: 16   General: Alert, no acute  distress Cardiovascular: No pedal edema Respiratory: No cyanosis, no use of accessory musculature GI: No organomegaly, abdomen is soft and non-tender Skin: No lesions in the area of chief complaint other than those listed below in MSK exam.  Neurologic: Sensation intact  distally Psychiatric: Patient is competent for consent with normal mood and affect Lymphatic: No axillary or cervical lymphadenopathy  MUSCULOSKELETAL:  Patient is a hemiplegic with limited function of the L arm and leg.  He has significant atrophy of the quad as a result.   R hip has no swelling or erythema.  No tenderness to palpation.  Full ROM of the R hip.  Sensation is intact with 2+ distal pulses.   Other extremities are atraumatic with painless ROM and NVI.  Assessment: Cystic lesion of the R femoral neck measuring 9.2 X 2.7cm  Plan: Incidental finding of cystic lesion of the R femur reviewed with the patient and his wife.  Explained that the large size of the cyst could compromise the structurally integrity of the femur increasing his risk of fracture.  Given the patient is dependent on the R leg for all transfers and mobility, it will be important to stabilize the femur with an intramedullary nail.  This can be done on an elective basis.  Will order the MRI with contrast today to further evaluate the nature of the cyst.  Xrays of the femur are also pending.  Will plan to see the patient in our outpatient clinic to discuss the results of the MRI and plan elective fixation of the femur.  Will be WBAT in the RLE.   [facility] [phone]   12/26/2014 2:46 PM

## 2014-12-27 ENCOUNTER — Inpatient Hospital Stay (HOSPITAL_COMMUNITY): Payer: Medicare Other

## 2014-12-27 DIAGNOSIS — I9589 Other hypotension: Secondary | ICD-10-CM

## 2014-12-27 LAB — COMPREHENSIVE METABOLIC PANEL WITH GFR
ALT: 36 U/L (ref 17–63)
AST: 39 U/L (ref 15–41)
Albumin: 1.8 g/dL — ABNORMAL LOW (ref 3.5–5.0)
Alkaline Phosphatase: 51 U/L (ref 38–126)
Anion gap: 7 (ref 5–15)
BUN: 8 mg/dL (ref 6–20)
CO2: 25 mmol/L (ref 22–32)
Calcium: 8.3 mg/dL — ABNORMAL LOW (ref 8.9–10.3)
Chloride: 109 mmol/L (ref 101–111)
Creatinine, Ser: 0.82 mg/dL (ref 0.61–1.24)
GFR calc Af Amer: >60 mL/min
GFR calc non Af Amer: >60 mL/min
Glucose, Bld: 94 mg/dL (ref 65–99)
Potassium: 3.1 mmol/L — ABNORMAL LOW (ref 3.5–5.1)
Sodium: 141 mmol/L (ref 135–145)
Total Bilirubin: 0.4 mg/dL (ref 0.3–1.2)
Total Protein: 4.5 g/dL — ABNORMAL LOW (ref 6.5–8.1)

## 2014-12-27 LAB — CBC WITH DIFFERENTIAL/PLATELET
Basophils Absolute: 0 K/uL (ref 0.0–0.1)
Basophils Relative: 0 %
Eosinophils Absolute: 0 K/uL (ref 0.0–0.7)
Eosinophils Relative: 1 %
HCT: 33.7 % — ABNORMAL LOW (ref 39.0–52.0)
Hemoglobin: 11.2 g/dL — ABNORMAL LOW (ref 13.0–17.0)
Lymphocytes Relative: 36 %
Lymphs Abs: 2.2 K/uL (ref 0.7–4.0)
MCH: 28.9 pg (ref 26.0–34.0)
MCHC: 33.2 g/dL (ref 30.0–36.0)
MCV: 87.1 fL (ref 78.0–100.0)
Monocytes Absolute: 0.6 K/uL (ref 0.1–1.0)
Monocytes Relative: 10 %
Neutro Abs: 3.2 K/uL (ref 1.7–7.7)
Neutrophils Relative %: 53 %
Platelets: 136 K/uL — ABNORMAL LOW (ref 150–400)
RBC: 3.87 MIL/uL — ABNORMAL LOW (ref 4.22–5.81)
RDW: 17 % — ABNORMAL HIGH (ref 11.5–15.5)
WBC: 6.1 K/uL (ref 4.0–10.5)

## 2014-12-27 LAB — MAGNESIUM: Magnesium: 2.1 mg/dL (ref 1.7–2.4)

## 2014-12-27 LAB — TESTOSTERONE: Testosterone: 359 ng/dL (ref 348–1197)

## 2014-12-27 LAB — TESTOSTERONE, FREE: Testosterone, Free: 3.8 pg/mL — ABNORMAL LOW (ref 6.6–18.1)

## 2014-12-27 MED ORDER — PREDNISONE 10 MG PO TABS: 10.0000 mg | ORAL_TABLET | Freq: Every day | ORAL | Status: DC

## 2014-12-27 MED ORDER — ACETAMINOPHEN 325 MG PO TABS: 325.0000 mg | ORAL_TABLET | Freq: Four times a day (QID) | ORAL | Status: DC | PRN

## 2014-12-27 MED ORDER — FLUDROCORTISONE ACETATE 0.1 MG PO TABS: 0.1000 mg | ORAL_TABLET | Freq: Two times a day (BID) | ORAL | Status: DC

## 2014-12-27 MED ORDER — POTASSIUM CHLORIDE ER 10 MEQ PO TBCR: 40.0000 meq | EXTENDED_RELEASE_TABLET | Freq: Every day | ORAL | Status: DC

## 2014-12-27 MED ORDER — SODIUM CHLORIDE 1 G PO TABS: 1.0000 g | ORAL_TABLET | Freq: Two times a day (BID) | ORAL | Status: DC

## 2014-12-27 MED ORDER — MIDODRINE HCL 2.5 MG PO TABS: 2.5000 mg | ORAL_TABLET | Freq: Three times a day (TID) | ORAL | Status: DC

## 2014-12-27 NOTE — Progress Notes (Signed)
Pt given discharge instructions, prescriptions, and care notes. Pt verbalized understanding AEB no further questions or concerns at this time. IV was discontinued, no redness, pain, or swelling noted at this time. Pt left the floor via wheelchair with staff in stable condition. Precious Gilding, RN.

## 2014-12-27 NOTE — Progress Notes (Signed)
Echocardiogram 2D Echocardiogram has been performed.  Tresa Res 12/27/2014, 11:57 AM

## 2014-12-27 NOTE — Discharge Summary (Signed)
Discharge Summary  Gregory Wiley LKG:[id number] DOB: 1950/06/26  PCP: [name], MD  Admit date: 12/20/2014 Discharge date: 12/27/2014  Time spent: >79mins  Recommendations for Outpatient Follow-up:  1. F/u with PMD within a week on 10/18 for hospital discharge follow up. 2. F/u with endocrinology Dr. Loanne Drilling within a week on 10/14 for adrenal insufficiency. 3. F/u with orthopedics Dr. Percell Miller for right femoral cyst. 4. F/u with [facility] 5. Home health arranged.  Discharge Diagnoses:  Active Hospital Problems   Diagnosis Date Noted  . HCAP (healthcare-associated pneumonia) 12/20/2014  . Adrenal insufficiency ([place])   . Numbness and tingling of left side of face   . Sepsis ([place])   . Cardiomyopathy, ischemic   . Hypomagnesemia   . Chronic constipation   . Chronic combined systolic and diastolic congestive heart failure ([place]) 11/11/2014  . Acute renal failure ([place]) 11/11/2014  . Dehydration 11/11/2014  . Hypokalemia   . Bilateral pulmonary embolism ([place]) 04/11/2014  . Hemiplegia affecting left dominant side ([place]) 02/16/2014  . 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 CAD, RCA PCI '09, 10/11, CABG X 4 5/12 06/30/2011    Resolved Hospital Problems   Diagnosis Date Noted Date Resolved  No resolved problems to display.    Discharge Condition: stable  Diet recommendation: regular diet  Filed Weights   12/25/14 0519 12/26/14 0346 12/26/14 0556  Weight: 212 lb 15.4 oz (96.6 kg) 213 lb 6.5 oz (96.8 kg) 213 lb 6.5 oz (96.8 kg)    History of present illness:  Gregory Wiley is a 64 y.o. male past medical history of hypertension, CAD status post CABG in 2012, chronic combined CHF dyslipidemia, CVA with residual left hemiparesis presents to the emergency department with the chief complaint of ongoing weakness dizziness worsening over the last 3 days. Initial evaluation in the emergency department reveals a chest x-ray  concerning for healthcare associated pneumonia, acute kidney injury, hypotension, tachycardia. Patient reports persistent generalized weakness and dizziness particularly with position change over the last several weeks. He states worsening over the last 3 days. Associated symptoms include decreased appetite persistent nausea no vomiting constipation, worsening shortness of breath with exertion dry cough and worsening peripheral vision. He denies fever chills chest pain palpitations. He denies headache abdominal pain dysuria hematuria frequency or urgency. He does report 140 pound weight loss over the last 10 months due to decreased appetite. Workup in the emergency department reveals sodium of 134 potassium of 3.3 chloride 95 creatinine 1.6 AST 45. Lactic acid 3.17. Initial troponin negative. Chest x-ray low lung volumes with bibasilar atelectasis. Possible subtle infiltrate at the left lung base. Patient unable to give urine specimen at time of my exam. On presentation patient was hypotensive with a systolic blood pressure in the low 80s and a heart rate of 120.Received fluid resuscitation. Time my exam his blood pressure 95/72 heart rate 101. In addition 4 to has and vancomycin initiated.  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)   Hemiplegia affecting left dominant side (HCC)   Bilateral pulmonary embolism (HCC)   Dehydration   Hypokalemia   Chronic combined systolic and diastolic congestive heart failure (HCC)   Acute renal failure (HCC)   Chronic constipation   Cardiomyopathy, ischemic   Hypomagnesemia   Adrenal insufficiency (HCC)   Numbness and tingling of left  side of face   Sepsis ([place])  Hypotension - persisting orthostatic sx-adrenal insufficiency Pt reports subacute/chronic hx of presyncopal sx w/ positional changes - he is  persistently hypotensive  Per chart review, he was recently hospitalized for hypotension, all his previous home bp meds was discontinued at the time.  - TSH WNL -Orthostatic vitals stable -received normal saline since admission, d/c ivf on 10/8. Restarted ivf on 10/9 due to persistent hypotension. Started salt tabs and midodrine, abdominal binder and lower extremity compression stocking.  low A.m. Cortisol C/W adrenal insufficient, ACTH low normal, cosyntropin test no appropriate increase of cortisol. More consistent with primary adrenal insufficiency, testosterone level pending, mri brain/CT ab no pituitary, no adrenal mass. discussed with endocrinology Dr. Renato Shin, recommended Florinef/prednisone and outpatient follow up with him closely. -repeated echocardiogram lvef 45%. No other source of infection, finished pna treatment. bp has stabilized on florinef/prednisone/midodrine/salt tabs/abdominal binder/copression stocking. Stopped antidepressant that likely to contribute to adrenal suppression.   LLL HCAP w/ sepsis -Cont empiric abx tx; complete 7 day course of antibiotics, last dose 10/10. -Physiotherapy vest TID -Mucinex DM BID -Flutter valve -PCXR; improved, lung clear, no cough, no chest pain , on room air. -all culture negative. cxr improving, ct ab no source of infection.  OSA -Continue CPAP per respiratory  Right femoral cyst: orthopedic Dr. Percell Miller consulted, input appreciated, outpatient follow up with ortho, possible intramural nail to strengthen the femur.   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 Weight 10/7 weight bed= 97.3 kg -monitor volume status, no edema, lung clear at discharge.  R MCA (temporal lobe - basal ganglia) embolic CVA w/ L hemiparesis 2015 -Head CT negative for  acute stroke patient neurologically intact except for previous LUE paralysis  -Patient adrenally insufficient with previous CVA , MRI brain during this hospitalization no acute findings. -PT/OT/home health, patient refused SNF. Outpatient neurology follow up  Hx bilateral PEs on chronic Xarelto  Anticoag continues , fall precaution  Acute renal failure -Resolved    Hypokalemia  -Potassium goal > 4  - K-Dur 40 mEq  Hypomagnesemia -Magnesium goal> 2 -Magnesium oxide 400 mg  Chronic constipation   Reported no bm for three weeks despite stool softener, enema ordered. Responded to second enema.  Code Status: DO NOT RESUSCITATE, confirmed by Dr. Sherral Hammers Family Communication: patient and wife at bedside Disposition Plan: will benefit from LTAC/SNF, but patient wants to go home, home with home health on 10/11.    Consultants: Dr.Pramod Clydene Fake, (stroke team) Endocrinology Dr. Loanne Drilling over the phone Orthopedics Dr Percell Miller  Procedure/Significant Events: 10/7 PCXR; LLL infiltrate improving   Culture 10/4 blood right arm/hand NGTD 10/4 MRSA by PCR negative 10/4 urine negative 10/4 strep pneumo urine antigen negative 10/4 HIV negative 10/6 sputum pending  Antibiotics:  Tressie Ellis 10/4>>10/10 Vancomycin 10/4>> stopped 10/5   DVT prophylaxis: Xarelto   Discharge Exam: BP 101/61 mmHg  Pulse 82  Temp(Src) 97.7 F (36.5 C) (Oral)  Resp 16  Ht 6' (1.829 m)  Wt 213 lb 6.5 oz (96.8 kg)  BMI 28.94 kg/m2  SpO2 98%  General: A/O 4, NAD, No acute respiratory distress Eyes: Negative headache, eye pain, double vision,negative scleral hemorrhage ENT: Negative Runny nose, negative ear pain, negative gingival bleeding, Neck: Negative scars, masses, torticollis, lymphadenopathy, JVD Lungs: Clear to auscultation bilaterally without wheezes  or crackles Cardiovascular: Regular rate and rhythm without murmur gallop or rub normal S1 and S2 Abdomen:negative abdominal pain,  nondistended, positive soft, bowel sounds, no rebound, no ascites, no appreciable mass Extremities: No significant cyanosis, clubbing, or edema bilateral lower extremities Psychiatric: Negative depression, negative anxiety, negative fatigue, negative mania  Neurologic: Cranial nerves II through XII intact, tongue/uvula midline, extremities on right side muscle strength 5/5 , chronic left hemiplegia, sensation intact throughout, negative dysarthria, negative expressive aphasia, negative receptive aphasia  Discharge Instructions You were cared for by a hospitalist during your hospital stay. If you have any questions about your discharge medications or the care you received while you were in the hospital after you are discharged, you can call the unit and asked to speak with the hospitalist on call if the hospitalist that took care of you is not available. Once you are discharged, your primary care physician will handle any further medical issues. Please note that NO REFILLS for any discharge medications will be authorized once you are discharged, as it is imperative that you return to your primary care physician (or establish a relationship with a primary care physician if you do not have one) for your aftercare needs so that they can reassess your need for medications and monitor your lab values.  Discharge Instructions    Ambulatory referral to Neurology    Complete by:  As directed   Please schedule post stroke follow up in 2 months.     Diet - low sodium heart healthy    Complete by:  As directed      Face-to-face encounter (required for Medicare/Medicaid patients)    Complete by:  As directed   I Ashley Bultema certify that this patient is under my care and that I, or a nurse practitioner or physician's assistant working with me, had a face-to-face encounter that meets the physician face-to-face encounter requirements with this patient on 12/27/2014. The encounter with the patient was in whole, or in  part for the following medical condition(s) which is the primary reason for home health care (List medical condition): FTT  The encounter with the patient was in whole, or in part, for the following medical condition, which is the primary reason for home health care:  FTT  I certify that, based on my findings, the following services are medically necessary home health services:  Physical therapy  Reason for Medically Necessary Home Health Services:  Skilled Nursing- Change/Decline in Patient Status  My clinical findings support the need for the above services:  Unsafe ambulation due to balance issues  Further, I certify that my clinical findings support that this patient is homebound due to:  Unsafe ambulation due to balance issues     Home Health    Complete by:  As directed   To provide the following care/treatments:   PT OT       Increase activity slowly    Complete by:  As directed   Encourage fluids intake            Medication List    STOP taking these medications        carvedilol 6.25 MG tablet  Commonly known as:  COREG     imipramine 25 MG tablet  Commonly known as:  TOFRANIL     methylphenidate 10 MG tablet  Commonly known as:  RITALIN     mirtazapine 15 MG tablet  Commonly known as:  REMERON     ondansetron 4 MG tablet  Commonly known as:  ZOFRAN     Vitamin D (Ergocalciferol) 50000 UNITS Caps capsule  Commonly known as:  DRISDOL      TAKE these medications        acetaminophen 325 MG tablet  Commonly known as:  TYLENOL  Take 1 tablet (325 mg total) by mouth every 6 (six) hours as needed for mild pain.     ALPRAZolam 0.25 MG tablet  Commonly known as:  XANAX  Take 0.5 tablets (0.125 mg total) by mouth every 8 (eight) hours as needed for anxiety.     colchicine 0.6 MG tablet  Take 0.6 mg by mouth as needed (for gout).     feeding supplement (ENSURE ENLIVE) Liqd  Take 237 mLs by mouth 2 (two) times daily between meals.     fludrocortisone 0.1 MG  tablet  Commonly known as:  FLORINEF  Take 1 tablet (0.1 mg total) by mouth 2 (two) times daily.     fluticasone 50 MCG/ACT nasal spray  Commonly known as:  FLONASE  Place 2 sprays into both nostrils daily.     HYDROcodone-acetaminophen 5-325 MG tablet  Commonly known as:  NORCO/VICODIN  Take one tablet up to twice daily as needed for headache.     hydrocortisone 25 MG suppository  Commonly known as:  ANUSOL-HC  Place 1 suppository (25 mg total) rectally daily. X 1 week; then, use as needed thereafter.     meclizine 25 MG tablet  Commonly known as:  ANTIVERT  Take 1 tablet (25 mg total) by mouth 3 (three) times daily as needed for dizziness.     Melatonin 10 MG Caps  Take 1 capsule by mouth at bedtime as needed (sleep).     midodrine 2.5 MG tablet  Commonly known as:  PROAMATINE  Take 1 tablet (2.5 mg total) by mouth 3 (three) times daily with meals.     nitroGLYCERIN 0.4 MG SL tablet  Commonly known as:  NITROSTAT  Place 0.4 mg under the tongue every 5 (five) minutes as needed. For chest pain     OVER THE COUNTER MEDICATION  Inhale 1 application into the lungs at bedtime. C-PAP     pantoprazole 40 MG tablet  Commonly known as:  PROTONIX  TAKE 1 TABLET BY MOUTH 2 TIMES A DAY.     potassium chloride 10 MEQ tablet  Commonly known as:  K-DUR  Take 4 tablets (40 mEq total) by mouth daily.     predniSONE 10 MG tablet  Commonly known as:  DELTASONE  Take 1 tablet (10 mg total) by mouth daily with breakfast.     promethazine 12.5 MG tablet  Commonly known as:  PHENERGAN  Take 1-2 tablets (12.5-25 mg total) by mouth every 6 (six) hours as needed for nausea or vomiting.     rivaroxaban 20 MG Tabs tablet  Commonly known as:  XARELTO  Take 1 tablet (20 mg total) by mouth daily with supper.     rosuvastatin 40 MG tablet  Commonly known as:  CRESTOR  Take 1 tablet (40 mg total) by mouth daily.     SENOKOT PO  Take 2 tablets by mouth daily.     sodium chloride 1 G  tablet  Take 1 tablet (1 g total) by mouth 2 (two) times daily with a meal.       Allergies  Allergen Reactions  . Diphenhydramine Hcl Anaphylaxis       Follow-up Information    Follow up with Antony Contras, MD.   Specialties:  Neurology, Radiology   Why:  Stroke Clinic, Office will call you with appointment date & time   Contact information:   [address] [place] [address] [phone]       Follow up with Ninetta Lights, MD On 01/03/2015.   Specialty:  Orthopedic Surgery   Why:  Appointment 10/18 @ 10:15, please arrive 15 minutes early.   Contact information:   [place] [address] [phone]       [facility] up with [facility].   Why:  Resume HH services. Will call 24 to 48 hours after discharge to set up home visit.   Contact information:   [place] [address] [phone]       Follow up with Renato Shin, MD On 12/30/2014.   Specialty:  Endocrinology   Why:  hospital follow up for adrenal insufficiency//Appointment with Dr. Loanne Drilling is on 12/30/14 at [place] information:   [address] [place] [address] [phone]       Follow up with Renette Butters, MD On 01/03/2015.   Specialty:  Orthopedic Surgery   Why:  Appointment with Dr. Percell Miller is on 01/03/15 at [place] information:   [address] [place] [address] [phone]       Follow up with [name], MD On 01/03/2015.   Specialty:  Family Medicine   Why:  hospital discharge follow up// Appointment with Dr. Damita Dunnings is on 01/03/15 at [facility] information:   [place]  [id number] [phone]        The results of significant diagnostics from this hospitalization (including imaging, microbiology, ancillary and laboratory) are listed below for reference.    Significant Diagnostic Studies: Ct Abdomen Pelvis Wo  Contrast  12/23/2014   CLINICAL DATA:  Adrenal insufficiency  EXAM: CT ABDOMEN AND PELVIS WITHOUT CONTRAST  TECHNIQUE: Multidetector CT imaging of the abdomen and pelvis was performed following the standard protocol without oral or intravenous contrast material administration.  COMPARISON:  None.  FINDINGS: Lower chest: There is slight bibasilar atelectasis. Patient is status post coronary artery bypass grafting. There is extensive native coronary artery calcification. There are calcified sub- carinal lymph nodes.  Hepatobiliary: There are occasional small calcified granulomas in the liver. No other focal liver lesions are identified on this noncontrast enhanced study. Gallbladder wall is not thickened. There is no biliary duct dilatation.  Pancreas: No mass or inflammatory focus. There is fatty infiltration in portions of the pancreas.  Spleen: There are scattered calcified granulomas in the spleen. Spleen otherwise appears normal.  Adrenals/Urinary Tract: Adrenals are normal in size and contour bilaterally. No adrenal lesions are identified.  There is a cyst in the midportion of the right kidney measuring 3.3 x 3.2 cm. There is no hydronephrosis on either side. No renal or ureteral calculi are identified on either side. Urinary bladder is midline with normal wall thickness.  Stomach/Bowel: There is no bowel wall thickening. No free air or portal venous air. No obstruction.  Vascular/Lymphatic: No adenopathy is seen in the abdomen or pelvis. There is atherosclerotic change in the aorta and iliac arteries without aneurysm.  Reproductive: Prostate is normal in size and contour. There are a few tiny prostatic calculi. No pelvic mass or pelvic fluid collection.  Other: In the mid abdomen, there is localized thickening of the mesentery, not causing distortion of adjacent  structures. There is no appreciable adenopathy in this area. There is a minimal ventral hernia containing only fat. There is fat in each inguinal  ring. There is no periappendiceal region inflammation.  Musculoskeletal: There is extensive muscle in the upper left anterior and lateral thigh. There is muscle atrophy involving the left lateral rectus muscles without frank herniation. There is also gluteal atrophy on the left. No intramuscular mass lesions or hematoma is seen. No abdominal wall lesions. There is a small phlebolith in the posterior pelvic wall. There is a large cystic appearing lesion in the proximal right femur extending from the right femoral neck into the proximal femoral diaphysis measuring 9.2 x 2.7 cm. This lesion contains chondroid appearing material. It is incompletely visualized on this study. There is narrowing of each hip joint. There is degenerative change in the lumbar spine.  IMPRESSION: Adrenals appear normal.  There is a 9.2 x 2.7 cm cystic appearing lesion in the proximal right femur which contains areas of apparent chondroid matrix. Etiology for this lesion uncertain. This finding warrants orthopedic consultation with consideration for potential MR of the right femur to further evaluate.  There is localized thickening of the mesenteric in the mid abdomen without surrounding distortion. 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  No bowel obstruction.  No abscess.  No renal or ureteral calculi.  No hydronephrosis.  Multiple foci of arterial vascular calcification.  Extensive muscle atrophy in the left pelvis/upper thigh region. There is also left lateral rectus muscle atrophy and left gluteal muscle atrophy. These findings are likely secondary to previous right-sided middle cerebral artery distribution infarct noted on prior MR.   Electronically Signed   By: Lowella Grip III M.D.   On: 12/23/2014 11:26   Ct Head Wo Contrast  12/21/2014   CLINICAL DATA:  Code stroke. RIGHT Facial droop. Speech difficulty with RIGHT lip numbness. Symptoms began earlier today.  EXAM: CT HEAD WITHOUT CONTRAST  TECHNIQUE: Contiguous axial  images were obtained from the base of the skull through the vertex without intravenous contrast.  COMPARISON:  12/20/2014 CT head.  MR head 02/12/2014.  FINDINGS: Chronic RIGHT MCA territory infarction with encephalomalacia involving the temporal lobe, insula, and basal ganglia primarily, and compensatory enlargement RIGHT lateral ventricle. Generalized atrophy with small vessel disease.  No definite acute stroke, acute hemorrhage, mass lesion, or hydrocephalus. Calvarium intact. Vascular siphon calcification. Chronic BILATERAL maxillary sinus disease. No acute orbital findings. RIGHT cataract extraction. Negative mastoids.  Chronic hyperdensity of the RIGHT ICA terminus, RIGHT M1, and RIGHT M2 vessels appear similar to most recent priors. No CT signs of proximal vascular thrombosis to explain a LEFT hemisphere event.  IMPRESSION: Chronic RIGHT MCA territory infarct.  No features to suggest an acute LEFT hemisphere cerebral vascular accident. No interval change compared with yesterday's exam.  Critical Value/emergent results were called by telephone at the time of interpretation on 12/21/2014 at 2:44 pm to Dr. Aram Beecham , who verbally acknowledged these results.   Electronically Signed   By: Staci Righter M.D.   On: 12/21/2014 14:50   Ct Head Wo Contrast  12/21/2014   CLINICAL DATA:  Acute onset of dizziness.  Initial encounter.  EXAM: CT HEAD WITHOUT CONTRAST  TECHNIQUE: Contiguous axial images were obtained from the base of the skull through the vertex without intravenous contrast.  COMPARISON:  CT of the head performed 11/15/2014  FINDINGS: There is no evidence of acute infarction, mass lesion, or intra- or extra-axial hemorrhage on CT.  There is a chronic right  MCA territory infarct, with associated encephalomalacia and ex vacuo dilatation of the right lateral ventricle. This involves the right basal ganglia. Periventricular and subcortical white matter change likely reflects small vessel ischemic  microangiopathy. Prominence of the sulci suggests mild cortical volume loss. Cerebellar atrophy is noted.  The brainstem and fourth ventricle are within normal limits. No mass effect or midline shift is seen.  There is no evidence of fracture; visualized osseous structures are unremarkable in appearance. The orbits are within normal limits. Mild mucosal thickening is noted at the maxillary sinuses bilaterally. The remaining paranasal sinuses and mastoid air cells are well-aerated. No significant soft tissue abnormalities are seen.  IMPRESSION: 1. No acute intracranial pathology seen on CT. 2. Chronic right MCA territory infarct, with associated encephalomalacia and ex vacuo dilatation of the right lateral ventricle. 3. Small vessel ischemic microangiopathy and mild cortical volume loss. 4. Mild mucosal thickening at the maxillary sinuses bilaterally.   Electronically Signed   By: Garald Balding M.D.   On: 12/21/2014 01:54   [name]  12/23/2014   CLINICAL DATA:  Personal history of right MCA infarct with left hemi paresis. Increased weakness and dizziness over the last 3 days.  EXAM: MRI HEAD WITHOUT CONTRAST  TECHNIQUE: Multiplanar, multiecho pulse sequences of the brain and surrounding structures were obtained without intravenous contrast.  COMPARISON:  CT head without contrast 12/21/2014. MRI brain 05/03/2014.  FINDINGS: A remote nonhemorrhagic infarct involving the right temporal lobe and right basal ganglia is again seen. Ex vacuo dilation of the right lateral ventricle is stable. Periventricular T2 changes are more prominent than on the prior exam. Wallerian degeneration in the right cerebral peduncle and brainstem demonstrates continued evolution. Moderate generalized atrophy is again seen.  No acute infarct, hemorrhage, or mass lesion is present. The internal auditory canals are within normal limits. Flow is present in the major intracranial arteries.  A right lens replacement is present. The  globes and orbits are otherwise intact. Mild mucosal thickening is present in the maxillary sinuses bilaterally. There is chronic wall thickening in both maxillary sinuses. Minimal mucosal thickening in the anterior left ethmoid air cells is noted. The remaining paranasal sinuses are clear. There is minimal fluid in the mastoid air cells.  Skullbase is within normal limits. Midline structures are unremarkable.  IMPRESSION: 1. No acute intracranial abnormality. 2. Remote right MCA territory infarct involving the anterior right temporal lobe and basal ganglia with cortical laminar necrosis. 3. Expected evolution of wallerian degeneration along the right cortical spinal tracts. 4. Progressive periventricular T2 changes bilaterally.   Electronically Signed   By: San Morelle M.D.   On: 12/23/2014 14:02   Mr Femur Right W Wo Contrast  12/27/2014   CLINICAL DATA:  Proximal right femur lesion.  EXAM: MRI OF THE RIGHT FEMUR WITHOUT AND WITH CONTRAST  TECHNIQUE: Multiplanar, multisequence MR imaging of the lower right extremity was performed both before and after administration of intravenous contrast.  CONTRAST:  56mL MULTIHANCE GADOBENATE DIMEGLUMINE 529 MG/ML IV SOLN  COMPARISON:  CT scan dated 12/23/2014 and radiographs dated 12/26/2014 and image of the lower abdomen dated 05/01/2004  FINDINGS: There is a 10.5 x 3.6 x 2.7 cm benign appearing chondroid tumor in the proximal right femur extending from the right femoral neck through the intertrochanteric region into the upper femoral shaft.  There is no thinning of the cortex of the right femur in the area of the tumor as compared to the opposite femur. This is consistent with a  benign enchondroma.  With benefit of retrospection, the very top of this lesion is visible on the radiograph dated 05/01/2004 and that proximal margin appears unchanged.  There is minimal enhancement with a few areas in the tumor.  The patient does have nonspecific edema in the adductor  muscles of both proximal thighs, more prominent on the left than the right. There small bilateral inguinal hernias containing only fat.  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 The proximal margin of this lesion appears unchanged since 2006. The other margins are not included on prior radiographs   Electronically Signed   By: Lorriane Shire M.D.   On: 12/27/2014 08:40   Dg Chest Port 1 View  12/23/2014   CLINICAL DATA:  Pneumonia  EXAM: PORTABLE CHEST - 1 VIEW  COMPARISON:  12/20/2014  FINDINGS: Cardiac shadow is stable. Improved aeration in the left lung base is noted. Postsurgical changes are again seen as well as a loop recorder. The right lung remains clear. No bony abnormality is noted.  IMPRESSION: Improved aeration in the left lung base. No acute infiltrate is seen.   Electronically Signed   By: Inez Catalina M.D.   On: 12/23/2014 07:22   Dg Chest Portable 1 View  12/20/2014   CLINICAL DATA:  Hypotension and code sepsis.  EXAM: PORTABLE CHEST 1 VIEW  COMPARISON:  11/11/2014  FINDINGS: Lung volumes are relatively low bilaterally with bibasilar atelectasis present. There may conceivably be a subtle infiltrate at the left lung base. No edema, pleural fluid, pneumothorax or nodule is identified. The heart size is normal status post prior CABG. There is stable appearance of an implanted loop recorder.  IMPRESSION: Low lung volumes with bibasilar atelectasis. Possible subtle infiltrate at the left lung base.   Electronically Signed   By: Aletta Edouard M.D.   On: 12/20/2014 13:43   Dg Femur, Min 2 Views Right  12/26/2014   CLINICAL DATA:  Lytic lesion of bone on x-ray. Pt states he had a CT of his right leg a few days ago and they found a lesion on his right hip. Pt has no complaints or pain of his right femur.  EXAM: RIGHT FEMUR 2 VIEWS  COMPARISON:  CT, 12/23/2014.  FINDINGS: Lucent lesion seen on prior CT is noted  on standard radiographs. This has a fairly thin sclerotic margin. Extends from the subcapital femoral neck to the meta diaphysis. It contains internal calcification consistent with calcified chondroid matrix. No other bone lesions. No fractures. Hip and knee joints are normally aligned. Soft tissues are unremarkable.  IMPRESSION: 1. As noted on the current CT, there is a lesion in the proximal right femur. The lesion extends from the subcapital femoral neck across the entire on metaphysis to the meta diaphysis. There is associated calcified matrix. Given the location, patient's age and relatively thin sclerotic margin, which is a nonaggressive feature, and lack of aggressive features, the lesion is most likely a large enchondroma. Since there are no radiographs her older to establish the chronicity of this, recommend followup radiographs in 3-4 months to document stability.   Electronically Signed   By: Lajean Manes M.D.   On: 12/26/2014 19:15    Microbiology: Recent Results (from the past 240 hour(s))  Blood Culture (routine x 2)     Status: None   Collection Time: 12/20/14  1:00 PM  Result Value Ref Range Status   Specimen Description BLOOD  RIGHT ARM  Final   Special Requests BOTTLES DRAWN AEROBIC AND ANAEROBIC 5CC  Final   Culture NO GROWTH 5 DAYS  Final   Report Status 12/25/2014 FINAL  Final  Blood Culture (routine x 2)     Status: None   Collection Time: 12/20/14  1:30 PM  Result Value Ref Range Status   Specimen Description BLOOD RIGHT HAND  Final   Special Requests BOTTLES DRAWN AEROBIC ONLY 5ML  Final   Culture NO GROWTH 5 DAYS  Final   Report Status 12/25/2014 FINAL  Final  MRSA PCR Screening     Status: None   Collection Time: 12/20/14  5:52 PM  Result Value Ref Range Status   MRSA by PCR NEGATIVE NEGATIVE Final    Comment:        The GeneXpert MRSA Assay (FDA approved for NASAL specimens only), is one component of a comprehensive MRSA colonization surveillance program. It is  not intended to diagnose MRSA infection nor to guide or monitor treatment for MRSA infections.   Urine culture     Status: None   Collection Time: 12/20/14  7:14 PM  Result Value Ref Range Status   Specimen Description URINE, CATHETERIZED  Final   Special Requests NONE  Final   Culture NO GROWTH 1 DAY  Final   Report Status 12/21/2014 FINAL  Final     Labs: Basic Metabolic Panel:  Recent Labs Lab 12/23/14 0320 12/24/14 0521 12/25/14 0755 12/26/14 0512 12/27/14 0611  NA 140 139 136 139 141  K 4.0 3.4* 3.2* 3.4* 3.1*  CL 109 110 104 105 109  CO2 21* 23 25 28 25   GLUCOSE 90 178* 131* 131* 94  BUN <5* <5* <5* 7 8  CREATININE 0.88 0.79 0.83 0.81 0.82  CALCIUM 8.6* 8.4* 8.6* 8.7* 8.3*  MG 1.8 1.7 1.9 2.2 2.1   Liver Function Tests:  Recent Labs Lab 12/23/14 0320 12/24/14 0521 12/25/14 0755 12/26/14 0512 12/27/14 0611  AST 22 18 27 31  39  ALT 26 20 24  33 36  ALKPHOS 61 60 58 51 51  BILITOT 0.6 0.5 0.2* 0.4 0.4  PROT 5.2* 5.3* 5.5* 5.2* 4.5*  ALBUMIN 2.0* 2.0* 2.3* 2.1* 1.8*   No results for input(s): LIPASE, AMYLASE in the last 168 hours. No results for input(s): AMMONIA in the last 168 hours. CBC:  Recent Labs Lab 12/23/14 0320 12/24/14 0521 12/25/14 0755 12/26/14 0512 12/27/14 0611  WBC 4.8 4.3 8.6 9.1 6.1  NEUTROABS 2.3 3.3 7.0 7.1 3.2  HGB 12.4* 11.4* 12.8* 12.1* 11.2*  HCT 36.7* 33.9* 37.4* 35.6* 33.7*  MCV 85.5 85.4 85.2 86.0 87.1  PLT 132* 116* 143* 166 136*   Cardiac Enzymes: No results for input(s): CKTOTAL, CKMB, CKMBINDEX, TROPONINI in the last 168 hours. BNP: BNP (last 3 results)  Recent Labs  04/11/14 2310  BNP 103.3*    ProBNP (last 3 results) No results for input(s): PROBNP in the last 8760 hours.  CBG: No results for input(s): GLUCAP in the last 168 hours.     SignedFlorencia Reasons MD, PhD  [facility] 12/27/2014, 12:57 PM

## 2014-12-28 ENCOUNTER — Telehealth: Payer: Self-pay | Admitting: *Deleted

## 2014-12-28 NOTE — Telephone Encounter (Signed)
Pharmacy notified by telephone. Left message for patient to call back.

## 2014-12-28 NOTE — Telephone Encounter (Signed)
Notify pt/family.

## 2014-12-28 NOTE — Telephone Encounter (Signed)
Received paperwork from [employer].  PA was denial.   Placed paperwork in Dr Josefine Class inbox

## 2014-12-28 NOTE — Telephone Encounter (Signed)
Transition Care Management Follow-up Telephone Call   Date discharged? 12/27/14   How have you been since you were released from the hospital? Still not 100% but improving   Do you understand why you were in the hospital? yes   Do you understand the discharge instructions? yes   Where were you discharged to? Home   Items Reviewed:  Medications reviewed: yes  Allergies reviewed: yes  Dietary changes reviewed: no  Referrals reviewed: yes, orthopedic surgery/endocrinology   Functional Questionnaire:   Activities of Daily Living (ADLs):   He states they are independent in the following: feeding, continence, grooming and toileting States they require assistance with the following: ambulation, bathing and hygiene and dressing   Any transportation issues/concerns?: no   Any patient concerns? yes, medication changes   Confirmed importance and date/time of follow-up visits scheduled yes, 01/03/15 @ 1600  Provider Appointment booked with Renford Dills, MD  Confirmed with patient if condition begins to worsen call PCP or go to the ER.  Patient was given the office number and encouraged to call back with question or concerns.  : yes

## 2014-12-29 ENCOUNTER — Telehealth: Payer: Self-pay | Admitting: *Deleted

## 2014-12-29 NOTE — Telephone Encounter (Signed)
Noted. Thanks.

## 2014-12-29 NOTE — Telephone Encounter (Signed)
Patient was ordered home health services through [place] following recent hospital discharge.  Mr. Stephanie is declining resuming services next week.  Cyril Mourning, RN calling to update.

## 2014-12-30 ENCOUNTER — Encounter: Payer: Self-pay | Admitting: Endocrinology

## 2014-12-30 ENCOUNTER — Telehealth: Payer: Self-pay | Admitting: Physical Medicine & Rehabilitation

## 2014-12-30 ENCOUNTER — Ambulatory Visit (INDEPENDENT_AMBULATORY_CARE_PROVIDER_SITE_OTHER): Payer: Medicare Other | Admitting: Endocrinology

## 2014-12-30 ENCOUNTER — Telehealth: Payer: Self-pay | Admitting: Endocrinology

## 2014-12-30 VITALS — BP 102/82 | HR 87 | Temp 98.4°F | Ht 72.0 in | Wt 218.0 lb

## 2014-12-30 DIAGNOSIS — E2740 Unspecified adrenocortical insufficiency: Secondary | ICD-10-CM | POA: Diagnosis not present

## 2014-12-30 LAB — TESTOSTERONE, % FREE: Testosterone-% Free: 1.6 % — ABNORMAL HIGH (ref 0.2–0.7)

## 2014-12-30 NOTE — Patient Instructions (Signed)
Please reduce the prednisone to 5 mg daily.  Please continue the same fludrocortisone. Please come back for a follow-up appointment in 2-3 weeks.  Our plan will be to gradually reduce the prednisone, then retest here in the office.

## 2014-12-30 NOTE — Progress Notes (Signed)
Subjective:    Patient ID: Gregory Wiley, male    DOB: December 12, 1950, 64 y.o.   MRN: [id number]  HPI Pt was in the hospital 2 weeks ago with pneumonia and sepsis.  He feels much better now.  Prior to that, he was hospitalized with hypotension.  He reports slight dizziness sensation in the head, but no assoc LOC.  Cortisol was low, but ACTH test was after she had already received IV-HC.  Past Medical History  Diagnosis Date  . Coronary artery disease     stent, then CABG 07/20/10  . Hypertension   . Hyperlipidemia   . Obesity (BMI 30-39.9)   . H/O cardiomyopathy     ischemic, now last echo 07/30/11, EF 38%W  . Diverticulosis   . Internal hemorrhoids   . Tubular adenoma of colon   . Plantar fasciitis of left foot   . Basal cell carcinoma of skin of left ankle   . NSTEMI (non-ST elevated myocardial infarction) ([place])     NSTEMI- last cath 06/2011-Stent to LCX-DES, last nuc 07/30/11 low risk  . MI (myocardial infarction) ([place])     "I had 4 before I retired in 2006" (11/11/2014)  . Pulmonary embolism ([place]) 03/2014  . OSA on CPAP     since July 2013  . GERD (gastroesophageal reflux disease)   . Bleeding stomach ulcer 2015  . Daily headache     "for the past month" (11/11/2014)  . Stroke ([place]) 01/2014    "no control of LUE, can slightly LLE; memory problems since" (11/11/2014)  . Arthritis     "back" (11/11/2014)  . Gout   . Depression     Past Surgical History  Procedure Laterality Date  . Coronary artery bypass graft  07/20/2010     LIMA-LAD; VG-ACUTE MARG of RCA; Seq VG-distal RCA & then pda  . Coronary angioplasty with stent placement  07/01/2011    DES-Resolute to native LCX  . Coronary angioplasty with stent placement  12/01/2007    COMPLEX 5 LESION PCI INCLUDING CUTTING BALLOON AND 4 CYPHER DESs  . Knee arthroscopy Right   . Hand surgery Right     to take glass out  . Colonoscopy N/A 02/10/2013    Procedure: COLONOSCOPY;  Surgeon: Ladene Artist, MD;  Location: [facility] ENDOSCOPY;   Service: Endoscopy;  Laterality: N/A;  . Retinal detachment surgery Right   . Loop recorder implant  02/15/2014    MDT LINQ implanted by Dr Rayann Heman for cryptogenic stroke  . Tee without cardioversion N/A 02/15/2014    Procedure: TRANSESOPHAGEAL ECHOCARDIOGRAM (TEE);  Surgeon: Josue Hector, MD;  Location: [facility] ENDOSCOPY;  Service: Cardiovascular;  Laterality: N/A;  . Left heart catheterization with coronary/graft angiogram  07/01/2011    Procedure: LEFT HEART CATHETERIZATION WITH Beatrix Fetters;  Surgeon: Sanda Klein, MD;  Location: [facility] CATH LAB;  Service: Cardiovascular;;  . Percutaneous coronary stent intervention (pci-s) Right 07/01/2011    Procedure: PERCUTANEOUS CORONARY STENT INTERVENTION (PCI-S);  Surgeon: Sanda Klein, MD;  Location: [facility] CATH LAB;  Service: Cardiovascular;  Laterality: Right;  . Umbilical hernia repair  2006  . Hernia repair    . Basal cell carcinoma excision Left 2015    ankle  . Esophagogastroduodenoscopy  01/2014    gastric ulcer, erosive gastroduodenitis, H Pylori negative.   . Esophagogastroduodenoscopy (egd) with propofol N/A 11/14/2014    Procedure: ESOPHAGOGASTRODUODENOSCOPY (EGD) WITH PROPOFOL;  Surgeon: Milus Banister, MD;  Location: [facility];  Service: Endoscopy;  Laterality: N/A;  Social History   Social History  . Marital Status: Married    Spouse Name: N/A  . Number of Children: 5  . Years of Education: N/A   Occupational History  .      works at the [employer]  . Smoking status: Former Smoker -- 2.00 packs/day for 4 years    Types: Cigarettes  . Smokeless tobacco: Never Used     Comment: "quit smoking cigarettes  in the 1970's"  . Alcohol Use: 0.0 oz/week    0 Standard drinks or equivalent per week     Comment: 11/11/2014 "1-2 times per year"  . Drug Use: Yes    Special: Marijuana     Comment: "smoked some pot in college"  . Sexual Activity: Not Currently   Other Topics Concern  . 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 acetaminophen (TYLENOL) 325 MG tablet Take 1 tablet (325 mg total) by mouth every 6 (six) hours as needed for mild pain.    Marland Kitchen ALPRAZolam (XANAX) 0.25 MG tablet Take 0.5 tablets (0.125 mg total) by mouth every 8 (eight) hours as needed for anxiety. 45 tablet 5  . colchicine 0.6 MG tablet Take 0.6 mg by mouth as needed (for gout).   0  . feeding supplement, ENSURE ENLIVE, (ENSURE ENLIVE) LIQD Take 237 mLs by mouth 2 (two) times daily between meals. (Patient taking differently: Take 237 mLs by mouth daily. ) 60 Bottle 0  . fludrocortisone (FLORINEF) 0.1 MG tablet Take 1 tablet (0.1 mg total) by mouth 2 (two) times daily. 60 tablet 0  . fluticasone (FLONASE) 50 MCG/ACT nasal spray Place 2 sprays into both nostrils daily. (Patient taking differently: Place 2 sprays into both nostrils daily as needed for allergies. )  2  . HYDROcodone-acetaminophen (NORCO/VICODIN) 5-325 MG per tablet Take one tablet up to twice daily as needed for headache. (Patient taking differently: Take 1 tablet by mouth every 6 (six) hours as needed for moderate pain (headache). Take one tablet up to twice daily as needed for headache.) 60 tablet 0  . hydrocortisone (ANUSOL-HC) 25 MG suppository Place 1 suppository (25 mg total) rectally daily. X 1 week; then, use as needed thereafter. (Patient taking differently: Place 25 mg rectally 2 (two) times daily as needed for hemorrhoids or itching. X 1 week; then, use as needed thereafter.) 30 suppository 11  . meclizine (ANTIVERT) 25 MG tablet Take 1 tablet (25 mg total) by mouth 3 (three) times daily as needed for dizziness. (Patient taking differently: Take 25 mg by mouth 3 (three) times daily as needed for dizziness (bed-time). ) 30 tablet 0  .  Melatonin 10 MG CAPS Take 1 capsule by mouth at bedtime as needed (sleep).     . midodrine (PROAMATINE) 2.5 MG tablet Take 1 tablet (2.5 mg total) by mouth 3 (three) times daily with meals. 90 tablet 0  . nitroGLYCERIN (NITROSTAT) 0.4 MG SL tablet Place 0.4 mg under the tongue every 5 (five) minutes as needed. For chest pain    . OVER THE COUNTER MEDICATION Inhale 1 application into the lungs at bedtime. C-PAP    . pantoprazole (PROTONIX) 40 MG tablet TAKE 1 TABLET BY MOUTH 2 TIMES  A DAY. 60 tablet 3  . potassium chloride (K-DUR) 10 MEQ tablet Take 4 tablets (40 mEq total) by mouth daily. 30 tablet 0  . promethazine (PHENERGAN) 12.5 MG tablet Take 1-2 tablets (12.5-25 mg total) by mouth every 6 (six) hours as needed for nausea or vomiting. 30 tablet 0  . rivaroxaban (XARELTO) 20 MG TABS tablet Take 1 tablet (20 mg total) by mouth daily with supper. 30 tablet 11  . rosuvastatin (CRESTOR) 40 MG tablet Take 1 tablet (40 mg total) by mouth daily. (Patient taking differently: Take 40 mg by mouth at bedtime. ) 90 tablet 0  . Sennosides (SENOKOT PO) Take 2 tablets by mouth daily.    . sodium chloride 1 G tablet Take 1 tablet (1 g total) by mouth 2 (two) times daily with a meal. 60 tablet 0   No current facility-administered medications on file prior to visit.    Allergies  Allergen Reactions  . Diphenhydramine Hcl Anaphylaxis    Family History  Problem Relation Age of Onset  . Diabetes type II Mother   . Coronary artery disease    . Colon cancer Neg Hx   . CAD Father 73    CABG, PCI, died at 54  . Heart attack Father 27  . Hypertension Brother   . Diabetes Brother   . Hyperlipidemia Brother   . Brain cancer Maternal Grandmother   . COPD Paternal Grandfather   . Other      denies family history of DVT/PE  . Stroke Neg Hx   . Other Neg Hx     adrenal dz    BP 102/82 mmHg  Pulse 87  Temp(Src) 98.4 F (36.9 C) (Oral)  Ht 6' (1.829 m)  Wt 218 lb (98.884 kg)  BMI 29.56 kg/m2  SpO2  98%  Review of Systems denies polyuria, loss of smell, rash, headache, change in skin tone, easy bruising, and rhinorrhea.  No change in left sided weakness.  He has cold intolerance, depression, slight hoarseness, and blurry vision.  He has lost 147 lbs over the past year, but has regained 10 since hospital stay.  Nausea is improved.      Objective:   Physical Exam VS: see vs page GEN: no distress.  In wheelchair. HEAD: head: no deformity eyes: no periorbital swelling, no proptosis external nose and ears are normal mouth: no lesion seen NECK: supple, thyroid is not enlarged CHEST WALL: no deformity.  Old healed surgical scar (median sternotomy) LUNGS: clear to auscultation BREASTS:  No gynecomastia CV: reg rate and rhythm, no murmur.  MUSCULOSKELETAL: muscle bulk and strength are grossly normal.  no obvious joint swelling.  gait is normal and steady.   EXTEMITIES: no deformity.  no edema.  PULSES: no carotid bruit.   NEURO:  cn 2-12 grossly intact.   readily moves right side, but left side is weak.  sensation is intact to touch on all 4's.   SKIN:  Normal texture and temperature.  No rash or suspicious lesion is visible.   NODES:  None palpable at the neck.  PSYCH: alert, well-oriented.  Does not appear anxious nor depressed.   Lab Results  Component Value Date   TSH 1.035 12/23/2014  i personally reviewed electrocardiogram tracing (12/21/14): Indication: hypotension Impression: QS complexes anteriorly.    Assessment & Plan:  Hypotension.  There are several possible causes.  Because a dx of adrenal insuff would commit him to lifelong supplementation, he should have a taper, followed by repeat testing.  Patient is advised the following: Patient Instructions  Please reduce the prednisone to 5 mg daily.  Please continue the same fludrocortisone. Please come back for a follow-up appointment in 2-3 weeks.  Our plan will be to gradually reduce the prednisone, then retest here in  the office.

## 2014-12-30 NOTE — Telephone Encounter (Signed)
please call [facility] [phone] i'll see pt back in 2 weeks, when I hope to give clearance

## 2014-12-30 NOTE — Telephone Encounter (Signed)
-----   Message from Charlett Blake, MD sent at 08/29/2014  6:50 AM EDT ----- Regarding: FW: Referral for pain management    ----- Message -----    From: Hulda Marin, OT    Sent: 08/26/2014   3:04 PM      To: Hulda Marin, OT, Charlett Blake, MD Subject: Referral for pain management                   Dr. Letta Pate,  [name] is being seen by OT/ PT and he has significant left shoulder and leg pain as well as back pain. He demonstrates left elbow spasticity and his overall progress with ADLS/ mobility is limited related to his pain. He was a patient on CIR December 2015 yet d/c to a SNF.  I think he would benefit from a referral to your office for pain/ spasticity management.  I'm not sure of your process. Could your office contact him to set up an appointment?  His phone number is [phone].  Thanks,  [employer], OTR/L

## 2015-01-02 ENCOUNTER — Telehealth: Payer: Self-pay | Admitting: Endocrinology

## 2015-01-02 ENCOUNTER — Telehealth: Payer: Self-pay

## 2015-01-02 NOTE — Telephone Encounter (Signed)
Spoke with pt Gregory Wiley at [employer] and she is faxing over note for clearence now

## 2015-01-02 NOTE — Telephone Encounter (Signed)
Patient returning your call.

## 2015-01-02 NOTE — Telephone Encounter (Signed)
Left pt a VM to return call for information about [facility]

## 2015-01-03 ENCOUNTER — Ambulatory Visit (INDEPENDENT_AMBULATORY_CARE_PROVIDER_SITE_OTHER): Payer: Medicare Other | Admitting: Family Medicine

## 2015-01-03 ENCOUNTER — Encounter: Payer: Self-pay | Admitting: Family Medicine

## 2015-01-03 VITALS — BP 120/70 | HR 86 | Temp 98.0°F | Wt 218.0 lb

## 2015-01-03 DIAGNOSIS — N179 Acute kidney failure, unspecified: Secondary | ICD-10-CM

## 2015-01-03 DIAGNOSIS — E876 Hypokalemia: Secondary | ICD-10-CM

## 2015-01-03 DIAGNOSIS — J189 Pneumonia, unspecified organism: Secondary | ICD-10-CM

## 2015-01-03 DIAGNOSIS — R112 Nausea with vomiting, unspecified: Secondary | ICD-10-CM | POA: Diagnosis not present

## 2015-01-03 DIAGNOSIS — E2740 Unspecified adrenocortical insufficiency: Secondary | ICD-10-CM

## 2015-01-03 DIAGNOSIS — R1110 Vomiting, unspecified: Secondary | ICD-10-CM

## 2015-01-03 MED ORDER — POTASSIUM CHLORIDE ER 10 MEQ PO TBCR
40.0000 meq | EXTENDED_RELEASE_TABLET | Freq: Every day | ORAL | Status: DC
Start: 2015-01-03 — End: 2015-02-02

## 2015-01-03 NOTE — Patient Instructions (Addendum)
Go to the lab on the way out.  We'll contact you with your lab report. We'll address your potassium dose when I see your labs.   Recheck chest xray in about 6 weeks.   I'll await the notes from Dr. Loanne Drilling.  Take care.  Glad to see you.

## 2015-01-04 ENCOUNTER — Other Ambulatory Visit: Payer: Self-pay | Admitting: Family Medicine

## 2015-01-04 ENCOUNTER — Telehealth: Payer: Self-pay

## 2015-01-04 DIAGNOSIS — E876 Hypokalemia: Secondary | ICD-10-CM

## 2015-01-04 LAB — BASIC METABOLIC PANEL
BUN: 8 mg/dL (ref 6–23)
CO2: 30 mEq/L (ref 19–32)
Calcium: 8.7 mg/dL (ref 8.4–10.5)
Chloride: 105 mEq/L (ref 96–112)
Creatinine, Ser: 0.61 mg/dL (ref 0.40–1.50)
GFR: 140.98 mL/min (ref >60.00)
Glucose, Bld: 104 mg/dL — ABNORMAL HIGH (ref 70–99)
Potassium: 4 mEq/L (ref 3.5–5.1)
Sodium: 141 mEq/L (ref 135–145)

## 2015-01-04 NOTE — Assessment & Plan Note (Addendum)
Recheck BMET today, continue steroids as is.  Path/phys d/w pt.  It may be that this was a transient phenomena related to the acute illness or could be a chronic issue.  Will await endo input.   Discharged 10/11, seen 10/18.

## 2015-01-04 NOTE — Assessment & Plan Note (Signed)
Lungs clear, will recheck cxr in about 5-6 weeks.  D/w pt.

## 2015-01-04 NOTE — Progress Notes (Signed)
Hospital follow up.  Admitted with PNA and likely/presumed adrenal insufficiency.  CXR with PNA, ACTH stim test was likely affected by prior med use.  Was given abx as inpatient with fluid resuscitation, started on steroid replacement and will have outpatient endo fu.  R femur lesion noted, d/w pt, will need input from all MDs before pinning to inc stability in the leg.  Still with baseline L sided weakness but he feels better on current meds (and off BP meds).  Hasn't needed zofran now that nausea is improved/resolved.  No fevers, not SOB.  Still on K replacement for low K, due for repeat K level.  D/w pt.  Off antidepressant.  Eating better and mood is better now.    PMH and SH reviewed  ROS: See HPI, otherwise noncontributory.  Meds, vitals, and allergies reviewed.   nad In wheelchair Mmm rrr ctab abd soft Ext w/1+ BLE edema L sided weakness at baseline.

## 2015-01-04 NOTE — Telephone Encounter (Signed)
Please give the order. thanks.

## 2015-01-04 NOTE — Telephone Encounter (Signed)
Wes from [facility] called requesting verbal orders for patient's PT be extended to 2 times a week for 2 weeks.

## 2015-01-04 NOTE — Assessment & Plan Note (Signed)
Repeat BMET pending.

## 2015-01-04 NOTE — Assessment & Plan Note (Signed)
Recheck K, see notes on labs.  Continue replacement for now.

## 2015-01-04 NOTE — Telephone Encounter (Signed)
Verbal order given to Wes by telephone as instructed.

## 2015-01-04 NOTE — Assessment & Plan Note (Signed)
Resolved off zofran.

## 2015-01-05 ENCOUNTER — Telehealth: Payer: Self-pay | Admitting: *Deleted

## 2015-01-05 NOTE — Telephone Encounter (Signed)
Request for surgical clearance:  1. What type of surgery is being performed? Impending pathological femoral neck fracture  2. When is this surgery scheduled?  Pending clearance  3. Are there any medications that need to be held prior to surgery and how long? xarelto  4. Name of physician performing surgery?  Edmonia Lynch  5. What is your office phone and fax number? [phone]    Fax; [phone]

## 2015-01-09 ENCOUNTER — Ambulatory Visit (INDEPENDENT_AMBULATORY_CARE_PROVIDER_SITE_OTHER): Payer: Medicare Other | Admitting: Neurology

## 2015-01-09 ENCOUNTER — Encounter: Payer: Self-pay | Admitting: Neurology

## 2015-01-09 VITALS — BP 104/64 | HR 70 | Resp 18 | Ht 72.0 in | Wt 218.0 lb

## 2015-01-09 DIAGNOSIS — I63411 Cerebral infarction due to embolism of right middle cerebral artery: Secondary | ICD-10-CM

## 2015-01-09 DIAGNOSIS — IMO0002 Reserved for concepts with insufficient information to code with codable children: Secondary | ICD-10-CM

## 2015-01-09 DIAGNOSIS — I639 Cerebral infarction, unspecified: Secondary | ICD-10-CM

## 2015-01-09 DIAGNOSIS — G4489 Other headache syndrome: Secondary | ICD-10-CM

## 2015-01-09 DIAGNOSIS — F0631 Mood disorder due to known physiological condition with depressive features: Secondary | ICD-10-CM | POA: Diagnosis not present

## 2015-01-09 DIAGNOSIS — G4733 Obstructive sleep apnea (adult) (pediatric): Secondary | ICD-10-CM | POA: Diagnosis not present

## 2015-01-09 DIAGNOSIS — G8192 Hemiplegia, unspecified affecting left dominant side: Secondary | ICD-10-CM | POA: Diagnosis not present

## 2015-01-09 MED ORDER — SERTRALINE HCL 50 MG PO TABS: 50.0000 mg | ORAL_TABLET | Freq: Every day | ORAL | Status: DC

## 2015-01-09 NOTE — Telephone Encounter (Signed)
History of CAD, Bilateral PE; hold xarelto for 2 days prior to surgery

## 2015-01-09 NOTE — Progress Notes (Signed)
[facility]  PATIENT: Gregory Wiley DOB: 1950-10-06  REFERRING CLINICIAN: james Little HISTORY FROM: patient REASON FOR VISIT: stroke   HISTORICAL  CHIEF COMPLAINT:  Chief Complaint  Patient presents with  . History of CVA    Sts. was hospitalized at [facility] 2-3 weeks ago for r/o cva.  Sts. had new right sided facial drooping, slurred speech and numbness around mouth.  Sts. mri did not show a new cva.  Sts. mri showed a ? cyst right hip--ortho would like to go in and drain this but need clearance from Dr. Arlean Hopping  . Sleep Apnea    Sts. he only uses CPAP 2-3 nights per week--sts. the air that comes out of it is not heated.  Sts.  his machine is 2 yrs. old.  He believes he got it from [facility].  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     He was at [facility] with hypotension. He was not having N/V as occurred in the past.    He was noted to have slurred speech.    He was evaluated and he was told his adrenal glands were 'messed up and he was placed on steroids, potassium and set up to see endocrinology.    He was advised to f/u with [place].     The chronic headache has been much better since the occipital nerve block 6 weeks ago.   Stroke History:  On 02/12/2014, he woke up with weakness on the left side and was unable to stand up. He went to the emergency room.Marland Kitchen MRI of the brain showed an acute right MCA territory stroke that mostly involved the right temporal lobe with extension to the insula and the basal ganglia on the right. MR angiogram showed an occlusion of the right M1 segment  Of note, he had been on aspirin and Plavix that had been discontinued a few weeks earlier due to a bleeding ulcer.   Ultrasound of the carotid arteries showed less than 39% stenosis. A transesophageal echocardiogram showed a LVEF equals  45%, there was septal hypokinesia, no PFO or ASD was identified.  Cardioembolism was suspected.  He was initially placed on Plavix but that was subsequently changed to Xarelto after he was found to have pulmonary embolisms while at rehabilitation center.  Gait/Strength/sensation:    He has weakness on the left side. He uses a quad cane and can go 100 feet on a good day, 30-40 feet most of the time.   His leg is still extremely weak and he cannot raise the knee any but can straighten the lower leg out mildly (unchanged).    He can move left shoulder and raise arm some but not to level of shoulder.Marland Kitchen  He also has some numbness on the left side with some pain. He had some neglect and numbness initially that improved.  Swallow:   His dysphagia improved and and he can eat anything but has lost his appetite.  He has lost > 100 pounds since the stroke.    Mood/PBA/cognition:   He gets very emotional, often inappropriately.   He cries with minimal stuff like watching TV.   He gets angry easily as well.  He feels his cognition has returned to baseline but wife feels that he tends to have poor attention and decrease executive  function abilities.  Neglect has improved.   OSA/sleep/sleepiness:    He has obstructive sleep apnea but takes mask off in middle of night because the air gets too cold for him ( he has a humidifier and thinks it is using less water).   He snores still despite weight loss.    He has excessive daytime sleepiness but Ritalin was not helping much.     Left shoulder pain:   He reports left shoulder pain when his arm is moved or when he lays on his left side. In the past, he has had bursitis and injections have helped reduce the pain a fair amount.  He feels less pain than earlier in year.  Hydrocodone helps the pain but he feels cognitively slowed.     He needs to have hip surgery [facility]) and needs neurologic clearance.      REVIEW OF SYSTEMS:  Constitutional: No fevers,  chills, sweats, or change in appetite.  He sleeps poorly.    He is very tired Eyes: No visual changes, double vision, eye pain Ear, nose and throat: No hearing loss, ear pain, nasal congestion, sore throat Cardiovascular: No chest pain, palpitations Respiratory:  No shortness of breath at rest or with exertion.   No wheezes GastrointestinaI: No nausea, vomiting, diarrhea, abdominal pain, fecal incontinence Genitourinary:  No dysuria, urinary retention or frequency.  No nocturia. Musculoskeletal:  No neck pain, back pain but he has shoulder and hip pain Integumentary: No rash, pruritus, skin lesions Neurological: as above Psychiatric: No depression at this time.  No anxiety Endocrine: No palpitations, diaphoresis, change in appetite, change in weigh.  He notes feeling cold and increased thirst Hematologic/Lymphatic:  No anemia, purpura, petechiae. Allergic/Immunologic: No itchy/runny eyes, nasal congestion, recent allergic reactions, rashes  ALLERGIES: Allergies  Allergen Reactions  . Diphenhydramine Hcl Anaphylaxis    HOME MEDICATIONS: Outpatient Prescriptions Prior to Visit  Medication Sig Dispense Refill  . acetaminophen (TYLENOL) 325 MG tablet Take 1 tablet (325 mg total) by mouth every 6 (six) hours as needed for mild pain.    Marland Kitchen ALPRAZolam (XANAX) 0.25 MG tablet Take 0.5 tablets (0.125 mg total) by mouth every 8 (eight) hours as needed for anxiety. 45 tablet 5  . colchicine 0.6 MG tablet Take 0.6 mg by mouth as needed (for gout).   0  . fludrocortisone (FLORINEF) 0.1 MG tablet Take 1 tablet (0.1 mg total) by mouth 2 (two) times daily. 60 tablet 0  . fluticasone (FLONASE) 50 MCG/ACT nasal spray Place 2 sprays into both nostrils daily as needed for allergies or rhinitis.    Marland Kitchen HYDROcodone-acetaminophen (NORCO/VICODIN) 5-325 MG tablet Take 1 tablet by mouth every 6 (six) hours as needed for moderate pain.    . hydrocortisone (ANUSOL-HC) 25 MG suppository Place 25 mg rectally as needed  for hemorrhoids or itching.    . meclizine (ANTIVERT) 25 MG tablet Take 25 mg by mouth 3 (three) times daily as needed for dizziness.    . Melatonin 10 MG CAPS Take 1 capsule by mouth at bedtime as needed (sleep).     . midodrine (PROAMATINE) 2.5 MG tablet Take 1 tablet (2.5 mg total) by mouth 3 (three) times daily with meals. 90 tablet 0  . nitroGLYCERIN (NITROSTAT) 0.4 MG SL tablet Place 0.4 mg under the tongue every 5 (five) minutes as needed. For chest pain    . OVER THE COUNTER MEDICATION Inhale 1 application into the lungs at bedtime. C-PAP    . pantoprazole (  PROTONIX) 40 MG tablet TAKE 1 TABLET BY MOUTH 2 TIMES A DAY. 60 tablet 3  . potassium chloride (K-DUR) 10 MEQ tablet Take 4 tablets (40 mEq total) by mouth daily. 60 tablet 1  . predniSONE (DELTASONE) 5 MG tablet Take 5 mg by mouth daily with breakfast.    . promethazine (PHENERGAN) 12.5 MG tablet Take 1-2 tablets (12.5-25 mg total) by mouth every 6 (six) hours as needed for nausea or vomiting. 30 tablet 0  . rivaroxaban (XARELTO) 20 MG TABS tablet Take 1 tablet (20 mg total) by mouth daily with supper. 30 tablet 11  . rosuvastatin (CRESTOR) 40 MG tablet Take 40 mg by mouth at bedtime.    . Sennosides (SENOKOT PO) Take 2 tablets by mouth daily.    . sodium chloride 1 G tablet Take 1 tablet (1 g total) by mouth 2 (two) times daily with a meal. 60 tablet 0  . ENSURE (ENSURE) Take 237 mLs by mouth daily.     No facility-administered medications prior to visit.    PAST MEDICAL HISTORY: Past Medical History  Diagnosis Date  . Coronary artery disease     stent, then CABG 07/20/10  . Hypertension   . Hyperlipidemia   . Obesity (BMI 30-39.9)   . H/O cardiomyopathy     ischemic, now last echo 07/30/11, EF 38%W  . Diverticulosis   . Internal hemorrhoids   . Tubular adenoma of colon   . Plantar fasciitis of left foot   . Basal cell carcinoma of skin of left ankle   . NSTEMI (non-ST elevated myocardial infarction) ([place])     NSTEMI-  last cath 06/2011-Stent to LCX-DES, last nuc 07/30/11 low risk  . MI (myocardial infarction) ([place])     "I had 4 before I retired in 2006" (11/11/2014)  . Pulmonary embolism ([place]) 03/2014  . OSA on CPAP     since July 2013  . GERD (gastroesophageal reflux disease)   . Bleeding stomach ulcer 2015  . Daily headache     "for the past month" (11/11/2014)  . Stroke ([place]) 01/2014    "no control of LUE, can slightly LLE; memory problems since" (11/11/2014)  . Arthritis     "back" (11/11/2014)  . Gout   . Depression     PAST SURGICAL HISTORY: Past Surgical History  Procedure Laterality Date  . Coronary artery bypass graft  07/20/2010     LIMA-LAD; VG-ACUTE MARG of RCA; Seq VG-distal RCA & then pda  . Coronary angioplasty with stent placement  07/01/2011    DES-Resolute to native LCX  . Coronary angioplasty with stent placement  12/01/2007    COMPLEX 5 LESION PCI INCLUDING CUTTING BALLOON AND 4 CYPHER DESs  . Knee arthroscopy Right   . Hand surgery Right     to take glass out  . Colonoscopy N/A 02/10/2013    Procedure: COLONOSCOPY;  Surgeon: Ladene Artist, MD;  Location: [facility] ENDOSCOPY;  Service: Endoscopy;  Laterality: N/A;  . Retinal detachment surgery Right   . Loop recorder implant  02/15/2014    MDT LINQ implanted by Dr Rayann Heman for cryptogenic stroke  . Tee without cardioversion N/A 02/15/2014    Procedure: TRANSESOPHAGEAL ECHOCARDIOGRAM (TEE);  Surgeon: Josue Hector, MD;  Location: [facility];  Service: Cardiovascular;  Laterality: N/A;  . Left heart catheterization with coronary/graft angiogram  07/01/2011    Procedure: LEFT HEART CATHETERIZATION WITH Beatrix Fetters;  Surgeon: Sanda Klein, MD;  Location: [facility] CATH LAB;  Service: Cardiovascular;;  .  Percutaneous coronary stent intervention (pci-s) Right 07/01/2011    Procedure: PERCUTANEOUS CORONARY STENT INTERVENTION (PCI-S);  Surgeon: Sanda Klein, MD;  Location: [facility] CATH LAB;  Service: Cardiovascular;  Laterality: Right;  .  Umbilical hernia repair  2006  . Hernia repair    . Basal cell carcinoma excision Left 2015    ankle  . Esophagogastroduodenoscopy  01/2014    gastric ulcer, erosive gastroduodenitis, H Pylori negative.   . Esophagogastroduodenoscopy (egd) with propofol N/A 11/14/2014    Procedure: ESOPHAGOGASTRODUODENOSCOPY (EGD) WITH PROPOFOL;  Surgeon: Milus Banister, MD;  Location: [facility];  Service: Endoscopy;  Laterality: N/A;    FAMILY HISTORY: Family History  Problem Relation Age of Onset  . Diabetes type II Mother   . Coronary artery disease    . Colon cancer Neg Hx   . CAD Father 73    CABG, PCI, died at 20  . Heart attack Father 74  . Hypertension Brother   . Diabetes Brother   . Hyperlipidemia Brother   . Brain cancer Maternal Grandmother   . COPD Paternal Grandfather   . Other      denies family history of DVT/PE  . Stroke Neg Hx   . Other Neg Hx     adrenal dz    SOCIAL HISTORY:  Social History   Social History  . Marital Status: Married    Spouse Name: N/A  . Number of Children: 5  . Years of Education: N/A   Occupational History  .      works at the [employer] Topics  . Smoking status: Former Smoker -- 2.00 packs/day for 4 years    Types: Cigarettes  . Smokeless tobacco: Never Used     Comment: "quit smoking cigarettes  in the 1970's"  . Alcohol Use: 0.0 oz/week    0 Standard drinks or equivalent per week     Comment: 11/11/2014 "1-2 times per year"  . Drug Use: Yes    Special: Marijuana     Comment: "smoked some pot in college"  . Sexual Activity: Not Currently   Other Topics Concern  . Not on file   Social History Narrative   Quit smoking 40 years ago   Married    Likes to play guitar and sing with church group unable since 02/12/14 stroke      PHYSICAL EXAM  Filed Vitals:   01/09/15 1333  BP: 104/64  Pulse: 70  Resp: 18  Height: 6' (1.829 m)  Weight: 218 lb (98.884 kg)    Body mass index is 29.56  kg/(m^2).   General: The patient is well-developed and well-nourished and in no acute distress.  He is in a wheelchair. \ Neck: The neck is supple, no carotid bruits are noted.  The neck is now non-tender at the occiput   Skin: He has mild edema at the left ankle, compared to the right.    Neurologic Exam  Mental status: The patient is alert and oriented x 3 at the time of the examination. The patient has apparent normal recent and remote memory. Attention is mildly reduced.   Speech is normal.  Cranial nerves: Extraocular movements are full. Saccadic pursuit is reduced to the left.  PERRLA.  No facial droop. There is good facial sensation to soft touch bilaterally.  Trapezius and sternocleidomastoid strength is reduced on the left.. No dysarthria is noted.  No obvious hearing deficits are noted.  Motor:  Muscle bulk is normal. He has  increased tone in the left arm and leg.  Arm is held in a flexed position. This mostly involved the flexor muscles of the arm and flexors and extensors of the leg. Strength is 5/5 on the right. Strength is 2/5 in left shoulder elevation and 1/5 in proximal arm strength. Strength is 2/5 in the quadriceps and 1/5 in other proximal muscles of the left leg  Sensory: Sensory testing is intact to pinprick, soft touch, vibration sensation, and position sense on all 4 extremities. He did not have neglect or extinction at this time.  Coordination: Cerebellar testing reveals good finger-nose-finger and heel-to-shin on the right and he cannot do these tasks on the left..  Gait and station: Station requires support. He is only able to take a few steps with support.   DTRs:  Increased on left.   Marland Kitchen      DIAGNOSTIC DATA (LABS, IMAGING, TESTING) - I reviewed patient records, labs, notes, testing and imaging myself where available.  Lab Results  Component Value Date   WBC 6.1 12/27/2014   HGB 11.2* 12/27/2014   HCT 33.7* 12/27/2014   MCV 87.1 12/27/2014   PLT 136*  12/27/2014      Component Value Date/Time   NA 141 01/03/2015 1652   K 4.0 01/03/2015 1652   CL 105 01/03/2015 1652   CO2 30 01/03/2015 1652   GLUCOSE 104* 01/03/2015 1652   BUN 8 01/03/2015 1652   CREATININE 0.61 01/03/2015 1652   CREATININE 0.83 09/13/2014 1032   CALCIUM 8.7 01/03/2015 1652   PROT 4.5* 12/27/2014 0611   ALBUMIN 1.8* 12/27/2014 0611   AST 39 12/27/2014 0611   ALT 36 12/27/2014 0611   ALKPHOS 51 12/27/2014 0611   BILITOT 0.4 12/27/2014 0611   GFRNONAA >60 12/27/2014 0611   GFRNONAA >89 09/13/2014 1032   GFRAA >60 12/27/2014 0611   GFRAA >89 09/13/2014 1032   Lab Results  Component Value Date   CHOL 113 07/22/2014   HDL 38* 07/22/2014   LDLCALC 54 07/22/2014   TRIG 104 07/22/2014   CHOLHDL 3.0 07/22/2014   Lab Results  Component Value Date   HGBA1C 5.6 02/13/2014   Lab Results  Component Value Date   VOHYWVPX10 626 12/22/2014   Lab Results  Component Value Date   TSH 1.035 12/23/2014        ASSESSMENT AND PLAN  Obstructive sleep apnea syndrome - Plan: DME Other see comment  Hemiplegia affecting left dominant side (HCC)  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   Ok to go off Xarelto for a few days to have hip surgery.   I'll wrote letter for Ortho Edmonia Lynch) 2.   Zoloft for mood.   If not better trial of Nuedexta for possible PBA 3.   Use CPAP on a regular basis for his obstructive sleep apnea.   I will ask DME to check the  4.  He will return to see me in 4 months or sooner if he has new or worsening neurologic symptoms.  40 minutes face-to-face evaluation with greater than one half of the time counseling and coordinating care about stroke, mood and related symptoms.    Lalanya Rufener A. Felecia Shelling, MD, PhD 94/85/4627, 0:35 PM Certified in Neurology, Clinical Neurophysiology, Sleep Medicine, Pain Medicine and  Neuroimaging  [facility] [address] [place], [place] [address] [phone]

## 2015-01-09 NOTE — Telephone Encounter (Signed)
Faxed to Dr. Edmonia Lynch @ [phone]

## 2015-01-10 ENCOUNTER — Encounter: Payer: Self-pay | Admitting: Neurology

## 2015-01-11 ENCOUNTER — Ambulatory Visit (INDEPENDENT_AMBULATORY_CARE_PROVIDER_SITE_OTHER): Payer: Medicare Other | Admitting: *Deleted

## 2015-01-11 DIAGNOSIS — I63411 Cerebral infarction due to embolism of right middle cerebral artery: Secondary | ICD-10-CM

## 2015-01-12 NOTE — Progress Notes (Signed)
Loop recorder 

## 2015-01-13 ENCOUNTER — Ambulatory Visit (INDEPENDENT_AMBULATORY_CARE_PROVIDER_SITE_OTHER): Payer: Medicare Other | Admitting: Endocrinology

## 2015-01-13 ENCOUNTER — Encounter: Payer: Self-pay | Admitting: Endocrinology

## 2015-01-13 ENCOUNTER — Ambulatory Visit: Payer: Medicare Other | Admitting: Endocrinology

## 2015-01-13 VITALS — BP 116/80 | HR 93 | Temp 98.0°F | Ht 72.0 in | Wt 216.0 lb

## 2015-01-13 DIAGNOSIS — I959 Hypotension, unspecified: Secondary | ICD-10-CM | POA: Diagnosis not present

## 2015-01-13 NOTE — Progress Notes (Signed)
Subjective:    Patient ID: Gregory Wiley, male    DOB: 08-10-1950, 64 y.o.   MRN: [id number]  HPI Pt returns for f/u of hypotension (he was in the hospital last month with pneumonia and sepsis; prior to that, he had been hospitalized with hypotension.  cortisol was low, but ACTH test was after she had already received IV-HC; plan is to taper off prednisone, and manage hypotension with florinef).  He is planning for right hip surgery.  Since prednisone was reduced, he feels no different.  Denies dizziness, except after he takes vicodin.  Denies LOC.  wife brings a record of his BP which i have reviewed today.  It varies from 97-142/58-80 Past Medical History  Diagnosis Date  . Coronary artery disease     stent, then CABG 07/20/10  . Hypertension   . Hyperlipidemia   . Obesity (BMI 30-39.9)   . H/O cardiomyopathy     ischemic, now last echo 07/30/11, EF 38%W  . Diverticulosis   . Internal hemorrhoids   . Tubular adenoma of colon   . Plantar fasciitis of left foot   . Basal cell carcinoma of skin of left ankle   . NSTEMI (non-ST elevated myocardial infarction) ([place])     NSTEMI- last cath 06/2011-Stent to LCX-DES, last nuc 07/30/11 low risk  . MI (myocardial infarction) ([place])     "I had 4 before I retired in 2006" (11/11/2014)  . Pulmonary embolism ([place]) 03/2014  . OSA on CPAP     since July 2013  . GERD (gastroesophageal reflux disease)   . Bleeding stomach ulcer 2015  . Daily headache     "for the past month" (11/11/2014)  . Stroke ([place]) 01/2014    "no control of LUE, can slightly LLE; memory problems since" (11/11/2014)  . Arthritis     "back" (11/11/2014)  . Gout   . Depression     Past Surgical History  Procedure Laterality Date  . Coronary artery bypass graft  07/20/2010     LIMA-LAD; VG-ACUTE MARG of RCA; Seq VG-distal RCA & then pda  . Coronary angioplasty with stent placement  07/01/2011    DES-Resolute to native LCX  . Coronary angioplasty with stent placement  12/01/2007   COMPLEX 5 LESION PCI INCLUDING CUTTING BALLOON AND 4 CYPHER DESs  . Knee arthroscopy Right   . Hand surgery Right     to take glass out  . Colonoscopy N/A 02/10/2013    Procedure: COLONOSCOPY;  Surgeon: Ladene Artist, MD;  Location: [facility];  Service: Endoscopy;  Laterality: N/A;  . Retinal detachment surgery Right   . Loop recorder implant  02/15/2014    MDT LINQ implanted by Dr Rayann Heman for cryptogenic stroke  . Tee without cardioversion N/A 02/15/2014    Procedure: TRANSESOPHAGEAL ECHOCARDIOGRAM (TEE);  Surgeon: Josue Hector, MD;  Location: [facility] ENDOSCOPY;  Service: Cardiovascular;  Laterality: N/A;  . Left heart catheterization with coronary/graft angiogram  07/01/2011    Procedure: LEFT HEART CATHETERIZATION WITH Beatrix Fetters;  Surgeon: Sanda Klein, MD;  Location: [facility] CATH LAB;  Service: Cardiovascular;;  . Percutaneous coronary stent intervention (pci-s) Right 07/01/2011    Procedure: PERCUTANEOUS CORONARY STENT INTERVENTION (PCI-S);  Surgeon: Sanda Klein, MD;  Location: [facility] CATH LAB;  Service: Cardiovascular;  Laterality: Right;  . Umbilical hernia repair  2006  . Hernia repair    . Basal cell carcinoma excision Left 2015    ankle  . Esophagogastroduodenoscopy  01/2014    gastric ulcer,  erosive gastroduodenitis, H Pylori negative.   . Esophagogastroduodenoscopy (egd) with propofol N/A 11/14/2014    Procedure: ESOPHAGOGASTRODUODENOSCOPY (EGD) WITH PROPOFOL;  Surgeon: Milus Banister, MD;  Location: [facility];  Service: Endoscopy;  Laterality: N/A;    Social History   Social History  . Marital Status: Married    Spouse Name: N/A  . Number of Children: 5  . Years of Education: N/A   Occupational History  .      works at the [employer] [place] [employer] Topics  . Smoking status: Former Smoker -- 2.00 packs/day for 4 years    Types: Cigarettes  . Smokeless tobacco: Never Used     Comment: "quit smoking cigarettes  in the 1970's"  . Alcohol Use:  0.0 oz/week    0 Standard drinks or equivalent per week     Comment: 11/11/2014 "1-2 times per year"  . Drug Use: Yes    Special: Marijuana     Comment: "smoked some pot in college"  . Sexual Activity: Not Currently   Other Topics Concern  . Not on file   Social History Narrative   Quit smoking 40 years ago   Married    Likes to play guitar and sing with church group unable since 02/12/14 stroke     Current Outpatient Prescriptions on File Prior to Visit  Medication Sig Dispense Refill  . acetaminophen (TYLENOL) 325 MG tablet Take 1 tablet (325 mg total) by mouth every 6 (six) hours as needed for mild pain.    Marland Kitchen ALPRAZolam (XANAX) 0.25 MG tablet Take 0.5 tablets (0.125 mg total) by mouth every 8 (eight) hours as needed for anxiety. 45 tablet 5  . colchicine 0.6 MG tablet Take 0.6 mg by mouth as needed (for gout).   0  . ENSURE (ENSURE) Take 237 mLs by mouth daily.    . fludrocortisone (FLORINEF) 0.1 MG tablet Take 1 tablet (0.1 mg total) by mouth 2 (two) times daily. 60 tablet 0  . fluticasone (FLONASE) 50 MCG/ACT nasal spray Place 2 sprays into both nostrils daily as needed for allergies or rhinitis.    Marland Kitchen HYDROcodone-acetaminophen (NORCO/VICODIN) 5-325 MG tablet Take 1 tablet by mouth every 6 (six) hours as needed for moderate pain.    . hydrocortisone (ANUSOL-HC) 25 MG suppository Place 25 mg rectally as needed for hemorrhoids or itching.    . meclizine (ANTIVERT) 25 MG tablet Take 25 mg by mouth 3 (three) times daily as needed for dizziness.    . Melatonin 10 MG CAPS Take 1 capsule by mouth at bedtime as needed (sleep).     . midodrine (PROAMATINE) 2.5 MG tablet Take 1 tablet (2.5 mg total) by mouth 3 (three) times daily with meals. 90 tablet 0  . nitroGLYCERIN (NITROSTAT) 0.4 MG SL tablet Place 0.4 mg under the tongue every 5 (five) minutes as needed. For chest pain    . OVER THE COUNTER MEDICATION Inhale 1 application into the lungs at bedtime. C-PAP    . pantoprazole (PROTONIX)  40 MG tablet TAKE 1 TABLET BY MOUTH 2 TIMES A DAY. 60 tablet 3  . potassium chloride (K-DUR) 10 MEQ tablet Take 4 tablets (40 mEq total) by mouth daily. 60 tablet 1  . predniSONE (DELTASONE) 5 MG tablet Take 5 mg by mouth daily with breakfast.    . promethazine (PHENERGAN) 12.5 MG tablet Take 1-2 tablets (12.5-25 mg total) by mouth every 6 (six) hours as needed for nausea or vomiting. 30 tablet 0  .  rivaroxaban (XARELTO) 20 MG TABS tablet Take 1 tablet (20 mg total) by mouth daily with supper. 30 tablet 11  . rosuvastatin (CRESTOR) 40 MG tablet Take 40 mg by mouth at bedtime.    . Sennosides (SENOKOT PO) Take 2 tablets by mouth daily.    . sertraline (ZOLOFT) 50 MG tablet Take 1 tablet (50 mg total) by mouth daily. 30 tablet 11  . sodium chloride 1 G tablet Take 1 tablet (1 g total) by mouth 2 (two) times daily with a meal. 60 tablet 0   No current facility-administered medications on file prior to visit.    Allergies  Allergen Reactions  . Diphenhydramine Hcl Anaphylaxis    Family History  Problem Relation Age of Onset  . Diabetes type II Mother   . Coronary artery disease    . Colon cancer Neg Hx   . CAD Father 21    CABG, PCI, died at 73  . Heart attack Father 56  . Hypertension Brother   . Diabetes Brother   . Hyperlipidemia Brother   . Brain cancer Maternal Grandmother   . COPD Paternal Grandfather   . Other      denies family history of DVT/PE  . Stroke Neg Hx   . Other Neg Hx     adrenal dz    BP 116/80 mmHg  Pulse 93  Temp(Src) 98 F (36.7 C) (Oral)  Ht 6' (1.829 m)  Wt 216 lb (97.977 kg)  BMI 29.29 kg/m2  SpO2 98%  Review of Systems Denies chest pain and sob.     Objective:   Physical Exam VITAL SIGNS:  See vs page GENERAL: no distress.  In wheelchair LUNGS:  Clear to auscultation HEART:  Regular rate and rhythm without murmurs noted. Normal S1,S2.       Assessment & Plan:  Hypotension: the fact that systolic BP is below 831 on these 2 meds says  adrenal insuff is not the cause. If pt is NPO for surgery, he should receive hydrocortisone 50 mg IV for each NPO day, but he does not need tapering.    Patient is advised the following: Patient Instructions  Please continue the same medications.   I am giving the go-ahead for the surgery, from the standpoint of the adrenal glands.   Please come back for a follow-up appointment in 1 month, when we'll resume tapering of the prednisone, if we can.

## 2015-01-13 NOTE — Patient Instructions (Addendum)
Please continue the same medications.   I am giving the go-ahead for the surgery, from the standpoint of the adrenal glands.   Please come back for a follow-up appointment in 1 month, when we'll resume tapering of the prednisone, if we can.

## 2015-01-14 DIAGNOSIS — I959 Hypotension, unspecified: Secondary | ICD-10-CM | POA: Insufficient documentation

## 2015-01-17 ENCOUNTER — Telehealth: Payer: Self-pay | Admitting: Endocrinology

## 2015-01-17 NOTE — Telephone Encounter (Signed)
I contacted Gregory Wiley and advised the clearance that was sent wast given by the patient to [place] to complete and provided clearance from a DM stand point only.

## 2015-01-17 NOTE — Telephone Encounter (Addendum)
Dr. Kerman Passey surgery clearance was sent from our office to Ovidio Hanger, with our fax cover sheet, please advise  [phone]

## 2015-01-18 ENCOUNTER — Other Ambulatory Visit: Payer: Self-pay | Admitting: Family Medicine

## 2015-01-18 ENCOUNTER — Encounter: Payer: Self-pay | Admitting: Family Medicine

## 2015-01-18 ENCOUNTER — Ambulatory Visit (INDEPENDENT_AMBULATORY_CARE_PROVIDER_SITE_OTHER): Payer: Medicare Other | Admitting: Family Medicine

## 2015-01-18 VITALS — BP 114/68 | HR 86 | Temp 98.5°F | Wt 209.5 lb

## 2015-01-18 DIAGNOSIS — Z119 Encounter for screening for infectious and parasitic diseases, unspecified: Secondary | ICD-10-CM | POA: Diagnosis not present

## 2015-01-18 DIAGNOSIS — E876 Hypokalemia: Secondary | ICD-10-CM | POA: Diagnosis not present

## 2015-01-18 DIAGNOSIS — R55 Syncope and collapse: Secondary | ICD-10-CM | POA: Diagnosis not present

## 2015-01-18 DIAGNOSIS — Z66 Do not resuscitate: Secondary | ICD-10-CM

## 2015-01-18 NOTE — Patient Instructions (Addendum)
We'll contact you with your lab report. Take care.  Glad to see you.  Recheck chest xray in about 2 weeks . I'll check with endocrine in the meantime.

## 2015-01-18 NOTE — Progress Notes (Signed)
Pre visit review using our clinic review tool, if applicable. No additional management support is needed unless otherwise documented below in the visit note.  Was in the shower.  He had stood up, then sat back down.  He was brushing his teeth and then has LOC. He slid to his R, didn't hit the ground.  Was making a "gargling" sound at that point.  His eyes rolled back in his head.  Witnessed by his wife.  Had some foamy liquid in his mouth.  No jerking movement.  He gradually returned to normal.  He remembers the time after the event.  He remembers "it going dark" before the event.  No tongue biting.  Had been in the shower for about 5 minutes.  Water was lukewarm.  Had never happened prev.  No sx in the meantime.  Hasn't been in the shower since.  No new focal deficits.  BP 60min after the event was 130/92 with pulse 98.  He didn't have CP or SOB.    He started to have sx as he stood, felt sx coming on and sat down.  He didn't get resolution and then fainted.  On pred replacement but off BP meds.  He didn't feel well in general before the episode that day.    Pt opts in for HCV screening.  D/w pt re: routine screening.    Wants DNR status.  D/w pt.  Yellow sheet filled out, given to patient.  He doesn't want CPR or mechanical ventilation.  D/w pt that DNRs are typically suspended with elective surgery, ie his potential hip procedure.  He agrees with that.   Meds, vitals, and allergies reviewed.   ROS: See HPI.  Otherwise, noncontributory.  GEN: nad, alert and oriented HEENT: mucous membranes moist NECK: supple w/o LA CV: rrr.   PULM: ctab, no inc wob ABD: soft, +bs EXT: trace BLE edema SKIN: no acute rash Persistently weak on L side, at baseline.

## 2015-01-19 DIAGNOSIS — R55 Syncope and collapse: Secondary | ICD-10-CM | POA: Insufficient documentation

## 2015-01-19 DIAGNOSIS — Z66 Do not resuscitate: Secondary | ICD-10-CM | POA: Insufficient documentation

## 2015-01-19 LAB — BASIC METABOLIC PANEL
BUN: 13 mg/dL (ref 6–23)
CO2: 28 mEq/L (ref 19–32)
Calcium: 8.6 mg/dL (ref 8.4–10.5)
Chloride: 105 mEq/L (ref 96–112)
Creatinine, Ser: 0.84 mg/dL (ref 0.40–1.50)
GFR: 97.44 mL/min (ref >60.00)
Glucose, Bld: 112 mg/dL — ABNORMAL HIGH (ref 70–99)
Potassium: 4.2 mEq/L (ref 3.5–5.1)
Sodium: 139 mEq/L (ref 135–145)

## 2015-01-19 LAB — HEPATITIS C ANTIBODY: HCV Ab: NEGATIVE

## 2015-01-19 NOTE — Assessment & Plan Note (Signed)
Will route to both cards and endo for input re: likely low BP.  I think he likely had a vasovagal episode.  At this point, no change in meds.  Recheck BMET.  Continue salt repletion and steroids.  No sign of sz activity.  EKG w/o acute changes.  Still okay for outpatient f/u.  >25 minutes spent in face to face time with patient, >50% spent in counselling or coordination of care.

## 2015-01-20 ENCOUNTER — Other Ambulatory Visit: Payer: Medicare Other

## 2015-01-20 ENCOUNTER — Encounter: Payer: Self-pay | Admitting: Cardiology

## 2015-01-23 NOTE — Progress Notes (Addendum)
Anesthesia Note: Patient is a 64 year old male scheduled for IM right hip 02/07/15 by Dr. Edmonia Lynch. PAT is scheduled for 01/31/15. 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 PE 04/11/14, OSA on CPAP, GERD, bleeding ulcer, HTN but most recently hypotensive (started on Florinef, midodrine, prednisone--with plans to taper off prednisone 12/2014), HLD, depression, arthritis, skin cancer excision, gout, loop recorder 02/15/14, chronic headaches s/p occipital block. Hospitalization 12/20/14-12/27/14 for LLL HCAP with AKI, hypotension, tachycardia, dizziness. Hypotension persisted despite treatment, so there was question of adrenal insufficiency and was referred to endocrinology (see below).  Claiborne Billings at Dr. Debroah Loop office contacted me to review multiple clearances received including: - Neurology, Dr. Arlice Colt. He gave permission for patient to hold Xarelto "for several days" before and after his surgery. - Endocrinology, Dr. Renato Shin. He gave the "go-ahead" for surgery from the standpoint of the adrenal glands. His 01/13/15 note states, "the fact that systolic BP is below 341 on these 2 meds says adrenal insuff is not the cause. If pt is NPO for surgery, he should receive hydrocortisone 50 mg IV for each NPO day, but he dose not need tapering." I believe the two medications he is referring to are prednisone and Florinef.  - Cardiology, Dr. Shelva Majestic. He also gave permission to hold Xarelto (for two days) prior to surgery. - Gastroenterology, Dr. Lucio Edward. "Ok for surgery from a GI standpoint."   Recently seen by his PCP Dr. [name] on 01/18/15 for what sounds like a syncopal  episode in the shower. Dr. Damita Dunnings was going to forward to Dr. Claiborne Billings and Dr. Loanne Drilling, but thought patient likely had vasovagal episode. He did not change any medications. Recommended continued salt repletion and steroids. Of note, patient discussed DNR status with Dr. Damita Dunnings. His notes states, "Wants DNR status. D/w pt. Yellow sheet filled out, given to patient. He doesn't want CPR or mechanical ventilation. D/w pt that DNRs are typically suspended with elective surgery, ie his potential hip procedure. He agrees with that."   Meds include Xanax, colchicine, Florinef, Flonase, Norco, meclizine, melatonin, midodrine, Nitro, Protonix, K-dur, prednisone, promethazine, Xarelto, Crestor, Zoloft.   He will get vitals at PAT. By endocrinology notes, BP varies from 89-142/58-80.  01/18/15 EKG from his appointment with Dr. Damita Dunnings cannot be uploaded to Epic at this time. Will ask their office to fax a copy. 12/21/14 tracing showed NSR, LAD, cannot rule out anteroseptal infarct (age undetermined).  10/11/6 Echocardiogram:  - Left ventricle: The cavity size was normal. Systolic function was mildly reduced. The estimated ejection fraction was in the range of 45% to 50%. There is akinesis of the anteroseptal myocardium. Doppler parameters are consistent with abnormal left ventricularrelaxation (grade 1 diastolic dysfunction).  - Aortic valve: There was trivial regurgitation. Impressions: - Mildly improved EF when compared to prior. (Previous EF 40-45% 09/14/14, 25-30% 04/12/14.)   06/29/13 Myoview stress test: Overall Impression: Low risk stress nuclear study Scar anteroapical/anterior and inferoapical. EF 33%. No significant change from previous study.  07/01/11 Cardiac cath/PCI: LM: Ok LAD: ostial 100% LCx: mid 95% RCA: Ostial 100% L-LAD: ok, LAD with 2 sequential 70% beyond graft S-AM: ostial 100% S-dRCA/PDA: Ok, RCA beyond graft 80% PCI: 4.0 x 18  mm Resolute DES stent post dilated to 4.5 mm to  LCx  02/13/14 Carotid duplex: Summary: Bilateral: mild soft plaque distal CCA and origin ICA. 1-39% ICA stenosis. Vertebral artery flow is antegrade.  12/23/14 Brain MRI: IMPRESSION: 1. No acute intracranial abnormality. 2. Remote right MCA territory infarct involving the anterior right temporal lobe and basal ganglia with cortical laminar necrosis. 3. Expected evolution of wallerian degeneration along the right cortical spinal tracts. 4. Progressive periventricular T2 changes bilaterally.  12/23/14 1V CXR: IMPRESSION: Improved aeration in the left lung base. No acute infiltrate is seen.  He if for labs at PAT. I will reach out to Dr. Damita Dunnings to see if he received any feedback from Dr. Claiborne Billings or Dr. Loanne Drilling since patient's recent syncope episode. Will update my note once or more input is received or when he comes in for PAT. (Update: 01/31/15 12:50 PM. As of 01/24/15, Dr. Damita Dunnings had not received any additional input from Dr. Claiborne Billings or Dr. Loanne Drilling. I did call and notify Dr. Jackalyn Lombard office of patient's syncopal episode so his loop recorder could be interrogated. He was able to send a manual transmission. Per Donia Pounds, RN note on 01/24/15, "patient had no episodes on his LINQ." Discussed with anesthesiologist Dr. Marcie Bal last week. Patient with recent syncope episode but with known hypotension and no AS by recent echo, no significant carotid stenosis by recent carotid duplex, and no episodes noted on loop recorder interrogation. He was previously cleared by multiple physicians. He was re-evaluated by Dr. Damita Dunnings who felt episode was likely vasovagal episode. Will plan evaluation at his PAT visit to ensure no acute changes, but otherwise no additional recommendations at this time.)  George Hugh [facility] Short Stay Center/Anesthesiology Phone [phone] 01/23/2015 3:42 PM  Addendum: Patient seen today at PAT. Wife at bedside. Patient has left hemiparesis due to his 01/2014 CVA. Patient is  primarily wheelchair bound but can take a few steps to the bathroom with assistance. He denied any further syncope/seizure episodes. He have a general feeling of lightheadedness since his 01/2014 CVA. He passed his swallow evaluation but has had fairly persistent hoarseness since then. He is compliant with CPAP. He has a hospital bed and sleeps with his feet elevated. No new issues since he last saw Dr. Damita Dunnings. His wife is currently battling a cold (no fevers), but he has had no issues himself. He was just started on NaCl tablets for treatment of his hypotension.   PAT Vitals: T 98.0, BP 103/70, HR 69, O2 sat 100%. Heart RRR, no murmur noted. Lungs clear. No significant pre-tibial edema noted. He does have a full beard, which he said he also had at the time of his CABG in 2012 (reportedly was not asked to shave it at that time).  01/18/15 EKG (Dr. Damita Dunnings): SR, low voltage in precordial leads, non-specific ST depression, negative T wave in V2 and high lateral leads which has been present on prior tracings.  Patient said Dr. Damita Dunnings has a standing order for him to get a follow-up CXR at his office since it is right at 6 six weeks since he was treated for PNA. I offered to get CXR done today and notified him that Dr. Damita Dunnings could still access results. His preference was to get the CXR at Dr. Sherral Hammers will try to get this done by the end of the week. He denies fever, cough, SOB, chest pain that would suggest acute PNA.  Lungs sounds were also clear today.   Preoperative labs  noted. He is scheduled to hold Xarelto starting 02/04/15.   Today, patient appeared stable. He was advised to communicate with Dr. Percell Miller and other appropriate physicians if he does develop acute changes or new URI symptoms.   George Hugh [facility] Short Stay Center/Anesthesiology Phone [phone] 01/31/2015 3:16 PM

## 2015-01-24 ENCOUNTER — Telehealth: Payer: Self-pay | Admitting: Internal Medicine

## 2015-01-24 NOTE — Telephone Encounter (Signed)
[name] PA with anesthesia at [facility] [phone] re pt having a syncopal episode recently and having surgery 02-07-15, does pt need to have loop interrogated? pls advise patient

## 2015-01-24 NOTE — Telephone Encounter (Signed)
Attempted to reach patient at home, spoke with wife who states that the patient is not home and that she will relay a message.  I explained that I received a message from [facility] [place], that asked that his LINQ transmissions be reviewed for any episodes relating to a syncopal episode.  Patient's came on the phone and states that he "passed out a week or so ago".  He is also concerned that his Carelink monitor is not transmitting appropriately.  He is not sure if it is near him where he sleeps because his wife recently moved it.  I requested that he send a manual transmission from his Carelink monitor for review of any episodes (no episodes on Carelink as of most recent automatic transmission on 01/21/15).  In regards to his concerns that his Carelink monitor is not automatically transmitting, I explained that monitor must be within 50ft of where he sleeps, and explained signal strength and positioning of monitor.  Patient voices understanding and states that he and his wife will send a manual transmission.  He requests that I call him back around 12:15pm to discuss transmission results.  Will call back when transmission received.

## 2015-01-24 NOTE — Telephone Encounter (Signed)
Spoke with patient's wife and relayed that patient has had no episodes on his LINQ.  Gave patient's wife Carelink tech services phone number to assist her in troubleshooting automatic device transmissions.  Patient's wife voices understanding and appreciation.  She denies any additional questions or concerns at this time and is aware to call with worsening symptoms or questions.

## 2015-01-27 ENCOUNTER — Telehealth: Payer: Self-pay | Admitting: Endocrinology

## 2015-01-27 MED ORDER — SODIUM CHLORIDE 1 G PO TABS: 1.0000 g | ORAL_TABLET | Freq: Two times a day (BID) | ORAL | Status: DC

## 2015-01-27 MED ORDER — FLUDROCORTISONE ACETATE 0.1 MG PO TABS: 0.1000 mg | ORAL_TABLET | Freq: Two times a day (BID) | ORAL | Status: DC

## 2015-01-27 NOTE — Telephone Encounter (Signed)
Pt needs sodium chloride and fludorcortisone call into [facility]

## 2015-01-27 NOTE — Telephone Encounter (Signed)
See note below and please advise if we can refill these rx's. Rx is listed under another provider.

## 2015-01-27 NOTE — Telephone Encounter (Signed)
Please refill prn 

## 2015-01-27 NOTE — Telephone Encounter (Signed)
Rx refilled per pt's request.  

## 2015-01-27 NOTE — H&P (Signed)
PREOPERATIVE H&P  Chief Complaint: IMPENDING RIGHT HIP FRACTURE  HPI: Gregory Wiley is a 64 y.o. male who presents for preoperative history and physical with a diagnosis of IMPENDING RIGHT HIP FRACTURE. Hx of multiple myeloma with pathologic fracture of the R hip. Recommending surgical intervention to stabilize due to size and location. He has elected for surgical management.   Past Medical History  Diagnosis Date  . Coronary artery disease     stent, then CABG 07/20/10  . Hypertension   . Hyperlipidemia   . Obesity (BMI 30-39.9)   . H/O cardiomyopathy     ischemic, now last echo 07/30/11, EF 38%W  . Diverticulosis   . Internal hemorrhoids   . Tubular adenoma of colon   . Plantar fasciitis of left foot   . Basal cell carcinoma of skin of left ankle   . NSTEMI (non-ST elevated myocardial infarction) ([place])     NSTEMI- last cath 06/2011-Stent to LCX-DES, last nuc 07/30/11 low risk  . MI (myocardial infarction) ([place])     "I had 4 before I retired in 2006" (11/11/2014)  . Pulmonary embolism ([place]) 03/2014  . OSA on CPAP     since July 2013  . GERD (gastroesophageal reflux disease)   . Bleeding stomach ulcer 2015  . Daily headache     "for the past month" (11/11/2014)  . Stroke ([place]) 01/2014    "no control of LUE, can slightly LLE; memory problems since" (11/11/2014)  . Arthritis     "back" (11/11/2014)  . Gout   . Depression    Past Surgical History  Procedure Laterality Date  . Coronary artery bypass graft  07/20/2010     LIMA-LAD; VG-ACUTE MARG of RCA; Seq VG-distal RCA & then pda  . Coronary angioplasty with stent placement  07/01/2011    DES-Resolute to native LCX  . Coronary angioplasty with stent placement  12/01/2007    COMPLEX 5 LESION PCI INCLUDING CUTTING BALLOON AND 4 CYPHER DESs  . Knee arthroscopy Right   . Hand surgery Right     to take glass out  . Colonoscopy N/A 02/10/2013    Procedure: COLONOSCOPY;  Surgeon: Ladene Artist, MD;  Location: [facility] ENDOSCOPY;  Service:  Endoscopy;  Laterality: N/A;  . Retinal detachment surgery Right   . Loop recorder implant  02/15/2014    MDT LINQ implanted by Dr Rayann Heman for cryptogenic stroke  . Tee without cardioversion N/A 02/15/2014    Procedure: TRANSESOPHAGEAL ECHOCARDIOGRAM (TEE);  Surgeon: Josue Hector, MD;  Location: [facility] ENDOSCOPY;  Service: Cardiovascular;  Laterality: N/A;  . Left heart catheterization with coronary/graft angiogram  07/01/2011    Procedure: LEFT HEART CATHETERIZATION WITH Beatrix Fetters;  Surgeon: Sanda Klein, MD;  Location: [place] CATH LAB;  Service: Cardiovascular;;  . Percutaneous coronary stent intervention (pci-s) Right 07/01/2011    Procedure: PERCUTANEOUS CORONARY STENT INTERVENTION (PCI-S);  Surgeon: Sanda Klein, MD;  Location: [facility] CATH LAB;  Service: Cardiovascular;  Laterality: Right;  . Umbilical hernia repair  2006  . Hernia repair    . Basal cell carcinoma excision Left 2015    ankle  . Esophagogastroduodenoscopy  01/2014    gastric ulcer, erosive gastroduodenitis, H Pylori negative.   . Esophagogastroduodenoscopy (egd) with propofol N/A 11/14/2014    Procedure: ESOPHAGOGASTRODUODENOSCOPY (EGD) WITH PROPOFOL;  Surgeon: Milus Banister, MD;  Location: [facility];  Service: Endoscopy;  Laterality: N/A;   Social History   Social History  . Marital Status: Married    Spouse Name: N/A  .  Number of Children: 5  . Years of Education: N/A   Occupational History  .      works at the [employer] Topics  . Smoking status: Former Smoker -- 2.00 packs/day for 4 years    Types: Cigarettes  . Smokeless tobacco: Never Used     Comment: "quit smoking cigarettes  in the 1970's"  . Alcohol Use: 0.0 oz/week    0 Standard drinks or equivalent per week     Comment: 11/11/2014 "1-2 times per year"  . Drug Use: Yes    Special: Marijuana     Comment: "smoked some pot in college"  . Sexual Activity: Not Currently   Other Topics Concern  . Not on file    Social History Narrative   Quit smoking 40 years ago   Married    Likes to play guitar and sing with church group unable since 02/12/14 stroke    [employer] 1970-1972, [place], no service related issues.     Family History  Problem Relation Age of Onset  . Diabetes type II Mother   . Coronary artery disease    . Colon cancer Neg Hx   . CAD Father 74    CABG, PCI, died at 33  . Heart attack Father 71  . Hypertension Brother   . Diabetes Brother   . Hyperlipidemia Brother   . Brain cancer Maternal Grandmother   . COPD Paternal Grandfather   . Other      denies family history of DVT/PE  . Stroke Neg Hx   . Other Neg Hx     adrenal dz   Allergies  Allergen Reactions  . Diphenhydramine Hcl Anaphylaxis   Prior to Admission medications   Medication Sig Start Date End Date Taking? Authorizing Provider  acetaminophen (TYLENOL) 325 MG tablet Take 1 tablet (325 mg total) by mouth every 6 (six) hours as needed for mild pain. 12/27/14  Yes Florencia Reasons, MD  ALPRAZolam Duanne Moron) 0.25 MG tablet Take 0.5 tablets (0.125 mg total) by mouth every 8 (eight) hours as needed for anxiety. 04/14/14  Yes Tiffany L Reed, DO  colchicine 0.6 MG tablet Take 0.6 mg by mouth as needed (for gout).  04/09/14  Yes Historical Provider, MD  fluticasone (FLONASE) 50 MCG/ACT nasal spray Place 2 sprays into both nostrils daily as needed for allergies or rhinitis.   Yes Historical Provider, MD  HYDROcodone-acetaminophen (NORCO/VICODIN) 5-325 MG tablet Take 1 tablet by mouth every 6 (six) hours as needed for moderate pain.   Yes Historical Provider, MD  hydrocortisone (ANUSOL-HC) 25 MG suppository Place 25 mg rectally as needed for hemorrhoids or itching.   Yes Historical Provider, MD  meclizine (ANTIVERT) 25 MG tablet Take 25 mg by mouth 3 (three) times daily as needed for dizziness.   Yes Historical Provider, MD  Melatonin 10 MG CAPS Take 1 capsule by mouth at bedtime as needed (sleep).    Yes Historical Provider, MD   midodrine (PROAMATINE) 2.5 MG tablet Take 1 tablet (2.5 mg total) by mouth 3 (three) times daily with meals. 12/27/14  Yes Florencia Reasons, MD  nitroGLYCERIN (NITROSTAT) 0.4 MG SL tablet Place 0.4 mg under the tongue every 5 (five) minutes as needed. For chest pain   Yes Historical Provider, MD  OVER THE COUNTER MEDICATION Inhale 1 application into the lungs at bedtime. C-PAP   Yes Historical Provider, MD  pantoprazole (PROTONIX) 40 MG tablet TAKE 1 TABLET BY MOUTH 2 TIMES A DAY. 09/15/14  Yes [name], MD  potassium chloride (K-DUR) 10 MEQ tablet Take 4 tablets (40 mEq total) by mouth daily. 01/03/15  Yes Tonia Ghent, MD  predniSONE (DELTASONE) 5 MG tablet Take 5 mg by mouth daily with breakfast.   Yes Historical Provider, MD  promethazine (PHENERGAN) 12.5 MG tablet Take 1-2 tablets (12.5-25 mg total) by mouth every 6 (six) hours as needed for nausea or vomiting. 10/28/14  Yes Pleas Koch, NP  rivaroxaban (XARELTO) 20 MG TABS tablet Take 1 tablet (20 mg total) by mouth daily with supper. 05/05/14  Yes Britt Bottom, MD  rosuvastatin (CRESTOR) 40 MG tablet Take 40 mg by mouth at bedtime.   Yes Historical Provider, MD  Sennosides (SENOKOT PO) Take 2 tablets by mouth 2 (two) times daily.    Yes Historical Provider, MD  sertraline (ZOLOFT) 50 MG tablet Take 1 tablet (50 mg total) by mouth daily. 01/09/15  Yes Britt Bottom, MD  ENSURE (ENSURE) Take 237 mLs by mouth daily.    Historical Provider, MD  fludrocortisone (FLORINEF) 0.1 MG tablet Take 1 tablet (0.1 mg total) by mouth 2 (two) times daily. 01/27/15   Renato Shin, MD  sodium chloride 1 G tablet Take 1 tablet (1 g total) by mouth 2 (two) times daily with a meal. 01/27/15   Renato Shin, MD     Positive ROS: All other systems have been reviewed and were otherwise negative with the exception of those mentioned in the HPI and as above.  Physical Exam: General: Alert, no acute distress Cardiovascular: No pedal edema Respiratory: No  cyanosis, no use of accessory musculature GI: No organomegaly, abdomen is soft and non-tender Skin: No lesions in the area of chief complaint Neurologic: Sensation intact distally Psychiatric: Patient is competent for consent with normal mood and affect Lymphatic: No axillary or cervical lymphadenopathy  MUSCULOSKELETAL:  R hip has no erythema or warmth.  No tenderness to palpation.  No pain with ROM or log roll.  Sensation intact with 2+ distal pulses.   Assessment: IMPENDING RIGHT HIP FRACTURE  Plan: Plan for Procedure(s): INTRAMEDULLARY (IM) NAIL INTERTROCHANTRIC RIGHT HIP  The risks benefits and alternatives were discussed with the patient including but not limited to the risks of nonoperative treatment, versus surgical intervention including infection, bleeding, nerve injury,  blood clots, cardiopulmonary complications, morbidity, mortality, among others, and they were willing to proceed.   Gae Dry, PA-C  01/27/2015 3:37 PM

## 2015-01-31 ENCOUNTER — Encounter (HOSPITAL_COMMUNITY)
Admission: RE | Admit: 2015-01-31 | Discharge: 2015-01-31 | Disposition: A | Discharge status: Home / Self Care | Payer: Medicare Other | Source: Ambulatory Visit | Attending: Orthopedic Surgery | Admitting: Orthopedic Surgery

## 2015-01-31 ENCOUNTER — Encounter (HOSPITAL_COMMUNITY): Payer: Self-pay

## 2015-01-31 DIAGNOSIS — D1621 Benign neoplasm of long bones of right lower limb: Secondary | ICD-10-CM | POA: Insufficient documentation

## 2015-01-31 DIAGNOSIS — K219 Gastro-esophageal reflux disease without esophagitis: Secondary | ICD-10-CM | POA: Diagnosis not present

## 2015-01-31 DIAGNOSIS — G4733 Obstructive sleep apnea (adult) (pediatric): Secondary | ICD-10-CM | POA: Diagnosis not present

## 2015-01-31 DIAGNOSIS — Z01818 Encounter for other preprocedural examination: Secondary | ICD-10-CM | POA: Diagnosis present

## 2015-01-31 DIAGNOSIS — Z951 Presence of aortocoronary bypass graft: Secondary | ICD-10-CM | POA: Insufficient documentation

## 2015-01-31 DIAGNOSIS — E785 Hyperlipidemia, unspecified: Secondary | ICD-10-CM | POA: Diagnosis not present

## 2015-01-31 DIAGNOSIS — I252 Old myocardial infarction: Secondary | ICD-10-CM | POA: Diagnosis not present

## 2015-01-31 DIAGNOSIS — Z87891 Personal history of nicotine dependence: Secondary | ICD-10-CM | POA: Insufficient documentation

## 2015-01-31 DIAGNOSIS — I2510 Atherosclerotic heart disease of native coronary artery without angina pectoris: Secondary | ICD-10-CM | POA: Diagnosis not present

## 2015-01-31 DIAGNOSIS — Z955 Presence of coronary angioplasty implant and graft: Secondary | ICD-10-CM | POA: Diagnosis not present

## 2015-01-31 DIAGNOSIS — Z7902 Long term (current) use of antithrombotics/antiplatelets: Secondary | ICD-10-CM | POA: Diagnosis not present

## 2015-01-31 DIAGNOSIS — Z86711 Personal history of pulmonary embolism: Secondary | ICD-10-CM | POA: Diagnosis not present

## 2015-01-31 DIAGNOSIS — F329 Major depressive disorder, single episode, unspecified: Secondary | ICD-10-CM | POA: Diagnosis not present

## 2015-01-31 DIAGNOSIS — Z7952 Long term (current) use of systemic steroids: Secondary | ICD-10-CM | POA: Diagnosis not present

## 2015-01-31 DIAGNOSIS — I69354 Hemiplegia and hemiparesis following cerebral infarction affecting left non-dominant side: Secondary | ICD-10-CM | POA: Diagnosis not present

## 2015-01-31 DIAGNOSIS — I959 Hypotension, unspecified: Secondary | ICD-10-CM | POA: Diagnosis not present

## 2015-01-31 DIAGNOSIS — Z01812 Encounter for preprocedural laboratory examination: Secondary | ICD-10-CM | POA: Diagnosis not present

## 2015-01-31 DIAGNOSIS — Z79899 Other long term (current) drug therapy: Secondary | ICD-10-CM | POA: Diagnosis not present

## 2015-01-31 HISTORY — DX: Anxiety disorder, unspecified: F41.9

## 2015-01-31 HISTORY — DX: Unspecified adrenocortical insufficiency: E27.40

## 2015-01-31 HISTORY — DX: Personal history of other diseases of the digestive system: Z87.19

## 2015-01-31 HISTORY — DX: Pneumonia, unspecified organism: J18.9

## 2015-01-31 LAB — BASIC METABOLIC PANEL
Anion gap: 4 — ABNORMAL LOW (ref 5–15)
BUN: 9 mg/dL (ref 6–20)
CO2: 28 mmol/L (ref 22–32)
Calcium: 8.5 mg/dL — ABNORMAL LOW (ref 8.9–10.3)
Chloride: 110 mmol/L (ref 101–111)
Creatinine, Ser: 0.97 mg/dL (ref 0.61–1.24)
GFR calc Af Amer: >60 mL/min (ref >60)
GFR calc non Af Amer: >60 mL/min (ref >60)
Glucose, Bld: 111 mg/dL — ABNORMAL HIGH (ref 65–99)
Potassium: 3.8 mmol/L (ref 3.5–5.1)
Sodium: 142 mmol/L (ref 135–145)

## 2015-01-31 LAB — CBC
HCT: 40.9 % (ref 39.0–52.0)
Hemoglobin: 13 g/dL (ref 13.0–17.0)
MCH: 29.1 pg (ref 26.0–34.0)
MCHC: 31.8 g/dL (ref 30.0–36.0)
MCV: 91.5 fL (ref 78.0–100.0)
Platelets: 193 10*3/uL (ref 150–400)
RBC: 4.47 MIL/uL (ref 4.22–5.81)
RDW: 15.4 % (ref 11.5–15.5)
WBC: 7.8 10*3/uL (ref 4.0–10.5)

## 2015-01-31 NOTE — Anesthesia Preprocedure Evaluation (Addendum)
Anesthesia Evaluation  Patient identified by MRN, date of birth, ID band  Reviewed: Allergy & Precautions, NPO status   Airway Mallampati: II  TM Distance: >3 FB Neck ROM: Full    Dental   Pulmonary sleep apnea , former smoker,    breath sounds clear to auscultation       Cardiovascular hypertension, + CAD, + Past MI and +CHF   Rhythm:Regular Rate:Normal     Neuro/Psych    GI/Hepatic hiatal hernia, PUD, GERD  ,  Endo/Other    Renal/GU Renal disease     Musculoskeletal   Abdominal   Peds  Hematology   Anesthesia Other Findings   Reproductive/Obstetrics                           Anesthesia Physical Anesthesia Plan  ASA: IV  Anesthesia Plan: General   Post-op Pain Management:    Induction: Intravenous  Airway Management Planned: Oral ETT  Additional Equipment:   Intra-op Plan:   Post-operative Plan: Possible Post-op intubation/ventilation  Informed Consent: I have reviewed the patients History and Physical, chart, labs and discussed the procedure including the risks, benefits and alternatives for the proposed anesthesia with the patient or authorized representative who has indicated his/her understanding and acceptance.   Dental advisory given  Plan Discussed with: CRNA and Anesthesiologist  Anesthesia Plan Comments: (See my anesthesia note. Dr. Loanne Drilling recommended hydrocortisone 50 mg IV for each day he is NPO (office note in Epic). Myra Gianotti, PA-C )       Anesthesia Quick Evaluation

## 2015-01-31 NOTE — Progress Notes (Signed)
Per request from within- pt. Needing anesth. Consult, AUlice Brilliant is aware.

## 2015-01-31 NOTE — Pre-Procedure Instructions (Signed)
Gregory Wiley  01/31/2015      [facility] - [address], [place] [address] Phone: [phone] Fax: [phone]  [facility], [place] [address] [place] [address] Phone: [phone] Fax: [phone]    Your procedure is scheduled on 02/07/2015.  Report to [facility] Admitting at 5:30 A.M.  Call this number if you have problems the morning of surgery:  [phone]   Remember:  Do not eat food or drink liquids after midnight. MONDAY  Take these medicines the morning of surgery with A SIP OF WATER :Protonix, Sertraline, Midodrine   Do not wear jewelry   Do not wear lotions, powders, or perfumes.  You may wear deodorant.              Men may shave face and neck.   Do not bring valuables to the hospital.   [facility] is not responsible for any belongings or valuables.  Contacts, dentures or bridgework may not be worn into surgery.  Leave your suitcase in the car.  After surgery it may be brought to your room.  For patients admitted to the hospital, discharge time will be determined by your treatment team.  Patients discharged the day of surgery will not be allowed to drive home.   Name and phone number of your driver:   With wife  Special instructions:  Special Instructions: [place] - Preparing for Surgery  Before surgery, you can play an important role.  Because skin is not sterile, your skin needs to be as free of germs as possible.  You can reduce the number of germs on you skin by washing with CHG (chlorahexidine gluconate) soap before surgery.  CHG is an antiseptic cleaner which kills germs and bonds with the skin to continue killing germs even after washing.  Please DO NOT use if you have an allergy to CHG or antibacterial soaps.  If your skin becomes reddened/irritated stop using the CHG and inform your nurse when you arrive  at Short Stay.  Do not shave (including legs and underarms) for at least 48 hours prior to the first CHG shower.  You may shave your face.  Please follow these instructions carefully:   1.  Shower with CHG Soap the night before surgery and the  morning of Surgery.  2.  If you choose to wash your hair, wash your hair first as usual with your  normal shampoo.  3.  After you shampoo, rinse your hair and body thoroughly to remove the  Shampoo.  4.  Use CHG as you would any other liquid soap.  You can apply chg directly to the skin and wash gently with scrungie or a clean washcloth.  5.  Apply the CHG Soap to your body ONLY FROM THE NECK DOWN.    Do not use on open wounds or open sores.  Avoid contact with your eyes, ears, mouth and genitals (private parts).  Wash genitals (private parts)   with your normal soap.  6.  Wash thoroughly, paying special attention to the area where your surgery will be performed.  7.  Thoroughly rinse your body with warm water from the neck down.  8.  DO NOT shower/wash with your normal soap after using and rinsing off   the CHG Soap.  9.  Pat yourself dry with a clean towel.  10.  Wear clean pajamas.            11.  Place clean sheets on your bed the night of your first shower and do not sleep with pets.  Day of Surgery  Do not apply any lotions/deodorants the morning of surgery.  Please wear clean clothes to the hospital/surgery center.  Please read over the following fact sheets that you were given. Pain Booklet, Coughing and Deep Breathing and Surgical Site Infection Prevention

## 2015-01-31 NOTE — Progress Notes (Signed)
Pt. , wife & nurse, agreeing that pt. Will take last dose of Xarelto on 11/19

## 2015-01-31 NOTE — Progress Notes (Signed)
Pt. Denies chest complaints; no reports of body aches, no fever, chest congestion, but wife is with cold currently.

## 2015-02-02 ENCOUNTER — Other Ambulatory Visit: Payer: Self-pay | Admitting: Family Medicine

## 2015-02-02 ENCOUNTER — Other Ambulatory Visit: Payer: Self-pay | Admitting: Cardiovascular Disease

## 2015-02-02 NOTE — Telephone Encounter (Signed)
REFILL 

## 2015-02-06 ENCOUNTER — Ambulatory Visit (INDEPENDENT_AMBULATORY_CARE_PROVIDER_SITE_OTHER)
Admission: RE | Admit: 2015-02-06 | Discharge: 2015-02-06 | Disposition: A | Discharge status: Home / Self Care | Payer: Medicare Other | Source: Ambulatory Visit | Attending: Family Medicine | Admitting: Family Medicine

## 2015-02-06 DIAGNOSIS — J189 Pneumonia, unspecified organism: Secondary | ICD-10-CM

## 2015-02-06 MED ORDER — POTASSIUM CHLORIDE IN NACL 20-0.45 MEQ/L-% IV SOLN
INTRAVENOUS | Status: DC
Start: 2015-02-07 — End: 2015-02-07
  Filled 2015-02-06: qty 1000

## 2015-02-06 MED ORDER — CHLORHEXIDINE GLUCONATE 4 % EX LIQD: 60.0000 mL | Freq: Once | CUTANEOUS | Status: DC

## 2015-02-06 MED ORDER — CEFAZOLIN SODIUM-DEXTROSE 2-3 GM-% IV SOLR
2.0000 g | INTRAVENOUS | Status: AC
  Administered 2015-02-07: 2 g via INTRAVENOUS
  Filled 2015-02-06: qty 50

## 2015-02-06 MED ORDER — ACETAMINOPHEN 500 MG PO TABS
1000.0000 mg | ORAL_TABLET | Freq: Once | ORAL | Status: AC
  Administered 2015-02-07: 1000 mg via ORAL
  Filled 2015-02-06: qty 2

## 2015-02-07 ENCOUNTER — Inpatient Hospital Stay (HOSPITAL_COMMUNITY)
Admission: RE | Admit: 2015-02-07 | Discharge: 2015-02-08 | DRG: 481 | Disposition: A | Discharge status: Home Health | Payer: Medicare Other | Source: Ambulatory Visit | Attending: Orthopedic Surgery | Admitting: Orthopedic Surgery

## 2015-02-07 ENCOUNTER — Inpatient Hospital Stay (HOSPITAL_COMMUNITY): Payer: Medicare Other | Admitting: Vascular Surgery

## 2015-02-07 ENCOUNTER — Encounter (HOSPITAL_COMMUNITY)
Admission: RE | Disposition: A | Discharge status: Home Health | Payer: Self-pay | Source: Ambulatory Visit | Attending: Orthopedic Surgery

## 2015-02-07 ENCOUNTER — Encounter (HOSPITAL_COMMUNITY): Payer: Self-pay | Admitting: *Deleted

## 2015-02-07 ENCOUNTER — Inpatient Hospital Stay (HOSPITAL_COMMUNITY): Payer: Medicare Other | Admitting: Anesthesiology

## 2015-02-07 ENCOUNTER — Observation Stay (HOSPITAL_COMMUNITY): Payer: Medicare Other

## 2015-02-07 ENCOUNTER — Inpatient Hospital Stay (HOSPITAL_COMMUNITY): Payer: Medicare Other

## 2015-02-07 DIAGNOSIS — K219 Gastro-esophageal reflux disease without esophagitis: Secondary | ICD-10-CM | POA: Diagnosis present

## 2015-02-07 DIAGNOSIS — M84559A Pathological fracture in neoplastic disease, hip, unspecified, initial encounter for fracture: Principal | ICD-10-CM | POA: Diagnosis present

## 2015-02-07 DIAGNOSIS — C9000 Multiple myeloma not having achieved remission: Secondary | ICD-10-CM | POA: Diagnosis present

## 2015-02-07 DIAGNOSIS — I252 Old myocardial infarction: Secondary | ICD-10-CM

## 2015-02-07 DIAGNOSIS — Z8673 Personal history of transient ischemic attack (TIA), and cerebral infarction without residual deficits: Secondary | ICD-10-CM

## 2015-02-07 DIAGNOSIS — I2510 Atherosclerotic heart disease of native coronary artery without angina pectoris: Secondary | ICD-10-CM | POA: Diagnosis present

## 2015-02-07 DIAGNOSIS — I10 Essential (primary) hypertension: Secondary | ICD-10-CM | POA: Diagnosis present

## 2015-02-07 DIAGNOSIS — Z955 Presence of coronary angioplasty implant and graft: Secondary | ICD-10-CM

## 2015-02-07 DIAGNOSIS — Z419 Encounter for procedure for purposes other than remedying health state, unspecified: Secondary | ICD-10-CM

## 2015-02-07 DIAGNOSIS — Z7901 Long term (current) use of anticoagulants: Secondary | ICD-10-CM

## 2015-02-07 DIAGNOSIS — F329 Major depressive disorder, single episode, unspecified: Secondary | ICD-10-CM | POA: Diagnosis present

## 2015-02-07 DIAGNOSIS — Z85828 Personal history of other malignant neoplasm of skin: Secondary | ICD-10-CM

## 2015-02-07 DIAGNOSIS — Z86711 Personal history of pulmonary embolism: Secondary | ICD-10-CM

## 2015-02-07 DIAGNOSIS — M84453A Pathological fracture, unspecified femur, initial encounter for fracture: Secondary | ICD-10-CM

## 2015-02-07 DIAGNOSIS — E785 Hyperlipidemia, unspecified: Secondary | ICD-10-CM | POA: Diagnosis present

## 2015-02-07 DIAGNOSIS — Z7952 Long term (current) use of systemic steroids: Secondary | ICD-10-CM

## 2015-02-07 DIAGNOSIS — Z87891 Personal history of nicotine dependence: Secondary | ICD-10-CM

## 2015-02-07 DIAGNOSIS — G4733 Obstructive sleep apnea (adult) (pediatric): Secondary | ICD-10-CM | POA: Diagnosis present

## 2015-02-07 DIAGNOSIS — Z951 Presence of aortocoronary bypass graft: Secondary | ICD-10-CM

## 2015-02-07 HISTORY — PX: INTRAMEDULLARY (IM) NAIL INTERTROCHANTERIC: SHX5875

## 2015-02-07 LAB — PROTIME-INR
INR: 1.14 (ref 0.00–1.49)
Prothrombin Time: 14.8 seconds (ref 11.6–15.2)

## 2015-02-07 SURGERY — FIXATION, FRACTURE, INTERTROCHANTERIC, WITH INTRAMEDULLARY ROD
Anesthesia: General | Site: Hip | Laterality: Right

## 2015-02-07 MED ORDER — TAMSULOSIN HCL 0.4 MG PO CAPS
0.4000 mg | ORAL_CAPSULE | Freq: Every day | ORAL | Status: DC
  Administered 2015-02-07 – 2015-02-08 (×2): 0.4 mg via ORAL
  Filled 2015-02-07 (×2): qty 1

## 2015-02-07 MED ORDER — DIPHENHYDRAMINE HCL 50 MG/ML IJ SOLN
INTRAMUSCULAR | Status: AC
  Filled 2015-02-07: qty 1

## 2015-02-07 MED ORDER — ONDANSETRON HCL 4 MG/2ML IJ SOLN
INTRAMUSCULAR | Status: AC
  Filled 2015-02-07: qty 2

## 2015-02-07 MED ORDER — PHENYLEPHRINE 40 MCG/ML (10ML) SYRINGE FOR IV PUSH (FOR BLOOD PRESSURE SUPPORT)
PREFILLED_SYRINGE | INTRAVENOUS | Status: AC
  Filled 2015-02-07: qty 10

## 2015-02-07 MED ORDER — SODIUM CHLORIDE 1 G PO TABS
1.0000 g | ORAL_TABLET | Freq: Two times a day (BID) | ORAL | Status: DC
  Administered 2015-02-07 – 2015-02-08 (×3): 1 g via ORAL
  Filled 2015-02-07 (×4): qty 1

## 2015-02-07 MED ORDER — ESMOLOL HCL 100 MG/10ML IV SOLN
INTRAVENOUS | Status: AC
  Filled 2015-02-07: qty 10

## 2015-02-07 MED ORDER — DEXAMETHASONE SODIUM PHOSPHATE 4 MG/ML IJ SOLN
INTRAMUSCULAR | Status: AC
  Filled 2015-02-07: qty 1

## 2015-02-07 MED ORDER — GLYCOPYRROLATE 0.2 MG/ML IJ SOLN
INTRAMUSCULAR | Status: AC
  Filled 2015-02-07: qty 2

## 2015-02-07 MED ORDER — PREDNISONE 5 MG PO TABS
5.0000 mg | ORAL_TABLET | Freq: Once | ORAL | Status: AC
  Administered 2015-02-07: 5 mg via ORAL
  Filled 2015-02-07: qty 1

## 2015-02-07 MED ORDER — RIVAROXABAN 20 MG PO TABS
20.0000 mg | ORAL_TABLET | Freq: Every day | ORAL | Status: DC
Start: 2015-02-07 — End: 2015-02-08
  Administered 2015-02-07 – 2015-02-08 (×2): 20 mg via ORAL
  Filled 2015-02-07 (×2): qty 1

## 2015-02-07 MED ORDER — PROPOFOL 10 MG/ML IV BOLUS
INTRAVENOUS | Status: AC
  Filled 2015-02-07: qty 40

## 2015-02-07 MED ORDER — STERILE WATER FOR INJECTION IJ SOLN
INTRAMUSCULAR | Status: AC
  Filled 2015-02-07: qty 10

## 2015-02-07 MED ORDER — PROMETHAZINE HCL 12.5 MG PO TABS
12.5000 mg | ORAL_TABLET | Freq: Four times a day (QID) | ORAL | Status: DC | PRN
  Filled 2015-02-07: qty 2

## 2015-02-07 MED ORDER — 0.9 % SODIUM CHLORIDE (POUR BTL) OPTIME
TOPICAL | Status: DC | PRN
  Administered 2015-02-07: 1000 mL

## 2015-02-07 MED ORDER — COLCHICINE 0.6 MG PO TABS: 0.6000 mg | ORAL_TABLET | ORAL | Status: DC | PRN

## 2015-02-07 MED ORDER — GLYCOPYRROLATE 0.2 MG/ML IJ SOLN
INTRAMUSCULAR | Status: AC
  Filled 2015-02-07: qty 1

## 2015-02-07 MED ORDER — MIDODRINE HCL 2.5 MG PO TABS
2.5000 mg | ORAL_TABLET | Freq: Three times a day (TID) | ORAL | Status: DC
  Administered 2015-02-07 – 2015-02-08 (×5): 2.5 mg via ORAL
  Filled 2015-02-07 (×6): qty 1

## 2015-02-07 MED ORDER — ONDANSETRON HCL 4 MG PO TABS: 4.0000 mg | ORAL_TABLET | Freq: Four times a day (QID) | ORAL | Status: DC | PRN

## 2015-02-07 MED ORDER — HYDROCODONE-ACETAMINOPHEN 5-325 MG PO TABS: 1.0000 | ORAL_TABLET | Freq: Four times a day (QID) | ORAL | Status: DC | PRN

## 2015-02-07 MED ORDER — FENTANYL CITRATE (PF) 250 MCG/5ML IJ SOLN
INTRAMUSCULAR | Status: AC
  Filled 2015-02-07: qty 5

## 2015-02-07 MED ORDER — FLUDROCORTISONE ACETATE 0.1 MG PO TABS
0.1000 mg | ORAL_TABLET | Freq: Two times a day (BID) | ORAL | Status: DC
  Administered 2015-02-07 – 2015-02-08 (×3): 0.1 mg via ORAL
  Filled 2015-02-07 (×5): qty 1

## 2015-02-07 MED ORDER — MENTHOL 3 MG MT LOZG: 1.0000 | LOZENGE | OROMUCOSAL | Status: DC | PRN

## 2015-02-07 MED ORDER — SUCCINYLCHOLINE CHLORIDE 20 MG/ML IJ SOLN
INTRAMUSCULAR | Status: DC | PRN
  Administered 2015-02-07: 100 mg via INTRAVENOUS

## 2015-02-07 MED ORDER — HYDROCORTISONE ACETATE 25 MG RE SUPP
25.0000 mg | RECTAL | Status: DC | PRN
  Filled 2015-02-07: qty 1

## 2015-02-07 MED ORDER — NEOSTIGMINE METHYLSULFATE 10 MG/10ML IV SOLN
INTRAVENOUS | Status: AC
  Filled 2015-02-07: qty 1

## 2015-02-07 MED ORDER — FENTANYL CITRATE (PF) 100 MCG/2ML IJ SOLN
25.0000 ug | INTRAMUSCULAR | Status: DC | PRN
  Administered 2015-02-07 (×2): 25 ug via INTRAVENOUS
  Administered 2015-02-07: 50 ug via INTRAVENOUS

## 2015-02-07 MED ORDER — POTASSIUM CHLORIDE ER 10 MEQ PO TBCR
40.0000 meq | EXTENDED_RELEASE_TABLET | Freq: Once | ORAL | Status: AC
Start: 2015-02-07 — End: 2015-02-07
  Administered 2015-02-07: 40 meq via ORAL
  Filled 2015-02-07: qty 4

## 2015-02-07 MED ORDER — FENTANYL CITRATE (PF) 100 MCG/2ML IJ SOLN
INTRAMUSCULAR | Status: AC
  Administered 2015-02-07: 50 ug via INTRAVENOUS
  Filled 2015-02-07: qty 2

## 2015-02-07 MED ORDER — PANTOPRAZOLE SODIUM 40 MG PO TBEC
40.0000 mg | DELAYED_RELEASE_TABLET | Freq: Two times a day (BID) | ORAL | Status: DC
  Administered 2015-02-07 – 2015-02-08 (×2): 40 mg via ORAL
  Filled 2015-02-07 (×2): qty 1

## 2015-02-07 MED ORDER — PHENOL 1.4 % MT LIQD: 1.0000 | OROMUCOSAL | Status: DC | PRN

## 2015-02-07 MED ORDER — SENNA 8.6 MG PO TABS
2.0000 | ORAL_TABLET | Freq: Two times a day (BID) | ORAL | Status: DC
  Administered 2015-02-07 – 2015-02-08 (×3): 17.2 mg via ORAL
  Filled 2015-02-07 (×5): qty 2

## 2015-02-07 MED ORDER — SUGAMMADEX SODIUM 200 MG/2ML IV SOLN
INTRAVENOUS | Status: AC
  Filled 2015-02-07: qty 2

## 2015-02-07 MED ORDER — ONDANSETRON HCL 4 MG/2ML IJ SOLN
INTRAMUSCULAR | Status: DC | PRN
  Administered 2015-02-07: 4 mg via INTRAVENOUS

## 2015-02-07 MED ORDER — PROPOFOL 10 MG/ML IV BOLUS
INTRAVENOUS | Status: DC | PRN
Start: 2015-02-07 — End: 2015-02-07
  Administered 2015-02-07: 120 mg via INTRAVENOUS

## 2015-02-07 MED ORDER — CEFAZOLIN SODIUM-DEXTROSE 2-3 GM-% IV SOLR
2.0000 g | Freq: Four times a day (QID) | INTRAVENOUS | Status: AC
  Administered 2015-02-07 (×2): 2 g via INTRAVENOUS
  Filled 2015-02-07 (×2): qty 50

## 2015-02-07 MED ORDER — EPHEDRINE SULFATE 50 MG/ML IJ SOLN
INTRAMUSCULAR | Status: AC
  Filled 2015-02-07: qty 1

## 2015-02-07 MED ORDER — FENTANYL CITRATE (PF) 100 MCG/2ML IJ SOLN
INTRAMUSCULAR | Status: DC | PRN
  Administered 2015-02-07: 100 ug via INTRAVENOUS

## 2015-02-07 MED ORDER — ALPRAZOLAM 0.25 MG PO TABS: 0.1250 mg | ORAL_TABLET | Freq: Three times a day (TID) | ORAL | Status: DC | PRN

## 2015-02-07 MED ORDER — MECLIZINE HCL 25 MG PO TABS
25.0000 mg | ORAL_TABLET | Freq: Three times a day (TID) | ORAL | Status: DC | PRN
  Filled 2015-02-07: qty 1

## 2015-02-07 MED ORDER — ROCURONIUM BROMIDE 50 MG/5ML IV SOLN
INTRAVENOUS | Status: AC
  Filled 2015-02-07: qty 1

## 2015-02-07 MED ORDER — ESMOLOL HCL 100 MG/10ML IV SOLN
INTRAVENOUS | Status: DC | PRN
  Administered 2015-02-07 (×2): 20 mg via INTRAVENOUS

## 2015-02-07 MED ORDER — ROSUVASTATIN CALCIUM 40 MG PO TABS
40.0000 mg | ORAL_TABLET | Freq: Every day | ORAL | Status: DC
  Administered 2015-02-07: 40 mg via ORAL
  Filled 2015-02-07 (×2): qty 1

## 2015-02-07 MED ORDER — SERTRALINE HCL 50 MG PO TABS
50.0000 mg | ORAL_TABLET | Freq: Every day | ORAL | Status: DC
  Administered 2015-02-08: 50 mg via ORAL
  Filled 2015-02-07: qty 1

## 2015-02-07 MED ORDER — HYDROMORPHONE HCL 1 MG/ML IJ SOLN
1.0000 mg | INTRAMUSCULAR | Status: DC | PRN
  Administered 2015-02-07: 1 mg via INTRAVENOUS
  Filled 2015-02-07: qty 1

## 2015-02-07 MED ORDER — PREDNISONE 5 MG PO TABS
5.0000 mg | ORAL_TABLET | Freq: Every day | ORAL | Status: DC
  Administered 2015-02-08: 5 mg via ORAL
  Filled 2015-02-07: qty 1

## 2015-02-07 MED ORDER — ACETAMINOPHEN 650 MG RE SUPP: 650.0000 mg | Freq: Four times a day (QID) | RECTAL | Status: DC | PRN

## 2015-02-07 MED ORDER — MIDAZOLAM HCL 2 MG/2ML IJ SOLN
INTRAMUSCULAR | Status: AC
  Filled 2015-02-07: qty 2

## 2015-02-07 MED ORDER — PHENYLEPHRINE HCL 10 MG/ML IJ SOLN
INTRAMUSCULAR | Status: DC | PRN
  Administered 2015-02-07 (×3): 80 ug via INTRAVENOUS

## 2015-02-07 MED ORDER — HYDROCODONE-ACETAMINOPHEN 5-325 MG PO TABS
1.0000 | ORAL_TABLET | Freq: Four times a day (QID) | ORAL | Status: DC | PRN
  Administered 2015-02-07 – 2015-02-08 (×4): 2 via ORAL
  Filled 2015-02-07 (×4): qty 2

## 2015-02-07 MED ORDER — ARTIFICIAL TEARS OP OINT
TOPICAL_OINTMENT | OPHTHALMIC | Status: AC
  Filled 2015-02-07: qty 3.5

## 2015-02-07 MED ORDER — LACTATED RINGERS IV SOLN
INTRAVENOUS | Status: DC | PRN
  Administered 2015-02-07: 07:00:00 via INTRAVENOUS

## 2015-02-07 MED ORDER — SUCCINYLCHOLINE CHLORIDE 20 MG/ML IJ SOLN
INTRAMUSCULAR | Status: AC
  Filled 2015-02-07: qty 1

## 2015-02-07 MED ORDER — EPHEDRINE SULFATE 50 MG/ML IJ SOLN
INTRAMUSCULAR | Status: DC | PRN
  Administered 2015-02-07: 10 mg via INTRAVENOUS

## 2015-02-07 MED ORDER — LIDOCAINE HCL (CARDIAC) 20 MG/ML IV SOLN
INTRAVENOUS | Status: AC
  Filled 2015-02-07: qty 15

## 2015-02-07 MED ORDER — LIDOCAINE HCL (CARDIAC) 20 MG/ML IV SOLN
INTRAVENOUS | Status: DC | PRN
  Administered 2015-02-07: 60 mg via INTRATRACHEAL
  Administered 2015-02-07: 60 mg via INTRAVENOUS

## 2015-02-07 MED ORDER — LIDOCAINE HCL (CARDIAC) 20 MG/ML IV SOLN
INTRAVENOUS | Status: AC
  Filled 2015-02-07: qty 5

## 2015-02-07 MED ORDER — ACETAMINOPHEN 325 MG PO TABS: 650.0000 mg | ORAL_TABLET | Freq: Four times a day (QID) | ORAL | Status: DC | PRN

## 2015-02-07 MED ORDER — SUGAMMADEX SODIUM 200 MG/2ML IV SOLN
INTRAVENOUS | Status: DC | PRN
  Administered 2015-02-07: 190 mg via INTRAVENOUS

## 2015-02-07 MED ORDER — MELATONIN 10 MG PO CAPS: 1.0000 | ORAL_CAPSULE | Freq: Every evening | ORAL | Status: DC | PRN

## 2015-02-07 MED ORDER — ROCURONIUM BROMIDE 100 MG/10ML IV SOLN
INTRAVENOUS | Status: DC | PRN
  Administered 2015-02-07: 30 mg via INTRAVENOUS

## 2015-02-07 MED ORDER — ONDANSETRON HCL 4 MG/2ML IJ SOLN: 4.0000 mg | Freq: Four times a day (QID) | INTRAMUSCULAR | Status: DC | PRN

## 2015-02-07 SURGICAL SUPPLY — 40 items
BNDG COHESIVE 4X5 TAN STRL (GAUZE/BANDAGES/DRESSINGS) ×2 IMPLANT
BNDG GAUZE ELAST 4 BULKY (GAUZE/BANDAGES/DRESSINGS) ×2 IMPLANT
CLSR STERI-STRIP ANTIMIC 1/2X4 (GAUZE/BANDAGES/DRESSINGS) ×2 IMPLANT
COVER PERINEAL POST (MISCELLANEOUS) ×2 IMPLANT
COVER SURGICAL LIGHT HANDLE (MISCELLANEOUS) ×2 IMPLANT
DRAPE STERI IOBAN 125X83 (DRAPES) ×2 IMPLANT
DRSG MEPILEX BORDER 4X4 (GAUZE/BANDAGES/DRESSINGS) ×4 IMPLANT
DURAPREP 26ML APPLICATOR (WOUND CARE) ×2 IMPLANT
ELECT REM PT RETURN 9FT ADLT (ELECTROSURGICAL) ×2
ELECTRODE REM PT RTRN 9FT ADLT (ELECTROSURGICAL) ×1 IMPLANT
GLOVE BIO SURGEON STRL SZ 6.5 (GLOVE) ×2 IMPLANT
GLOVE BIO SURGEON STRL SZ7 (GLOVE) ×4 IMPLANT
GLOVE BIO SURGEON STRL SZ7.5 (GLOVE) ×2 IMPLANT
GLOVE BIOGEL PI IND STRL 7.0 (GLOVE) ×2 IMPLANT
GLOVE BIOGEL PI IND STRL 8 (GLOVE) ×1 IMPLANT
GLOVE BIOGEL PI INDICATOR 7.0 (GLOVE) ×2
GLOVE BIOGEL PI INDICATOR 8 (GLOVE) ×1
GOWN STRL REUS W/ TWL LRG LVL3 (GOWN DISPOSABLE) ×2 IMPLANT
GOWN STRL REUS W/TWL LRG LVL3 (GOWN DISPOSABLE) ×4
GUIDEROD T2 3X1000 (ROD) ×2 IMPLANT
K-WIRE  3.2X450M STR (WIRE) ×1
K-WIRE 3.2X450M STR (WIRE) ×1
KIT ROOM TURNOVER OR (KITS) ×2 IMPLANT
KWIRE 3.2X450M STR (WIRE) ×1 IMPLANT
MANIFOLD NEPTUNE II (INSTRUMENTS) ×2 IMPLANT
NAIL GAMMA LG R 5TI 10X360X125 (Nail) ×2 IMPLANT
NS IRRIG 1000ML POUR BTL (IV SOLUTION) ×2 IMPLANT
PACK GENERAL/GYN (CUSTOM PROCEDURE TRAY) ×2 IMPLANT
PAD ARMBOARD 7.5X6 YLW CONV (MISCELLANEOUS) ×2 IMPLANT
PAD CAST 4YDX4 CTTN HI CHSV (CAST SUPPLIES) ×1 IMPLANT
PADDING CAST COTTON 4X4 STRL (CAST SUPPLIES) ×1
SCREW LAG GAMMA 3 TI 10.5X100M (Screw) ×2 IMPLANT
SUT MNCRL AB 4-0 PS2 18 (SUTURE) IMPLANT
SUT MON AB 2-0 CT1 27 (SUTURE) IMPLANT
SUT MON AB 2-0 CT1 36 (SUTURE) ×2 IMPLANT
SUT VIC AB 0 CT1 27 (SUTURE) ×2
SUT VIC AB 0 CT1 27XBRD ANBCTR (SUTURE) ×1 IMPLANT
SUT VIC AB 0 CT2 27 (SUTURE) ×2 IMPLANT
TOWEL OR 17X24 6PK STRL BLUE (TOWEL DISPOSABLE) ×2 IMPLANT
TOWEL OR 17X26 10 PK STRL BLUE (TOWEL DISPOSABLE) ×2 IMPLANT

## 2015-02-07 NOTE — Transfer of Care (Signed)
Immediate Anesthesia Transfer of Care Note  Patient: Gregory Wiley  Procedure(s) Performed: Procedure(s): INTRAMEDULLARY (IM) NAIL INTERTROCHANTRIC RIGHT HIP (Right)  Patient Location: PACU  Anesthesia Type:General  Level of Consciousness: awake, alert  and oriented  Airway & Oxygen Therapy: Patient Spontanous Breathing and Patient connected to nasal cannula oxygen  Post-op Assessment: Report given to RN and Post -op Vital signs reviewed and stable  Post vital signs: Reviewed and stable  Last Vitals:  Filed Vitals:   02/07/15 0626  BP: 124/70  Pulse: 53  Temp: 36.2 C  Resp: 16    Complications: No apparent anesthesia complications

## 2015-02-07 NOTE — Interval H&P Note (Signed)
History and Physical Interval Note:  02/07/2015 7:00 AM  Gregory Wiley  has presented today for surgery, with the diagnosis of IMPENDING RIGHT HIP FRACTURE  The various methods of treatment have been discussed with the patient and family. After consideration of risks, benefits and other options for treatment, the patient has consented to  Procedure(s): INTRAMEDULLARY (IM) NAIL INTERTROCHANTRIC RIGHT HIP (Right) as a surgical intervention .  The patient's history has been reviewed, patient examined, no change in status, stable for surgery.  I have reviewed the patient's chart and labs.  Questions were answered to the patient's satisfaction.     [name]

## 2015-02-07 NOTE — Progress Notes (Signed)
Report to [facility] as primary

## 2015-02-07 NOTE — Op Note (Signed)
DATE OF SURGERY:  02/07/2015  TIME: 8:23 AM  PATIENT NAME:  Gregory Wiley  AGE: 64 y.o.  PRE-OPERATIVE DIAGNOSIS:  IMPENDING RIGHT HIP FRACTURE  POST-OPERATIVE DIAGNOSIS:  SAME  PROCEDURE:  INTRAMEDULLARY (IM) NAIL INTERTROCHANTRIC RIGHT HIP  SURGEON:  Marton Malizia D  ASSISTANT:  Lovett Calender, PA-C, She was present and scrubbed throughout the case, critical for completion in a timely fashion, and for retraction, instrumentation, and closure.   OPERATIVE IMPLANTS: [employer]ker Gamma Nail  PREOPERATIVE INDICATIONS:  Gregory Wiley is a 64 y.o. who has a large lytic lesion that is painful in his femoral neck region.   The risks benefits and alternatives were discussed with the patient including but not limited to the risks of nonoperative treatment, versus surgical intervention including infection, bleeding, nerve injury, malunion, nonunion, hardware prominence, hardware failure, need for hardware removal, blood clots, cardiopulmonary complications, morbidity, mortality, among others, and they were willing to proceed.    OPERATIVE PROCEDURE:  The patient was brought to the operating room and placed in the supine position. General anesthesia was administered, with a foley. He was placed on the fracture table.  Time out was then performed after sterile prep and drape. He received preoperative antibiotics.  Incision was made proximal to the greater trochanter. A guidewire was placed in the appropriate position. Confirmation was made on AP and lateral views. The above-named nail was opened. I opened the proximal femur with a reamer. I then placed the nail by hand easily down. I did not need to ream the femur.  Once the nail was completely seated, I placed a guidepin into the femoral head into the center center position. I measured the length, and then reamed the lateral cortex and up into the head. I then placed the lag screw. Slight compression was applied. Anatomic fixation achieved. Bone  quality was mediocre.  I then secured the proximal interlocking bolt, and took off a half a turn, and then removed the instruments, and took final C-arm pictures AP and lateral the entire length of the leg.   Anatomic reconstruction was achieved, and the wounds were irrigated copiously and closed with Vicryl followed by staples and sterile gauze for the skin. The patient was awakened and returned to PACU in stable and satisfactory condition. There no complications and the patient tolerated the procedure well.  He will be weightbearing as tolerated, and will be on chemical px  for a period of four weeks after discharge.   Gregory Wiley, M.D.    This note was generated using a template and dragon dictation system. In light of that, I have reviewed the note and all aspects of it are applicable to this case. Any dictation errors are due to the computerized dictation system.

## 2015-02-07 NOTE — Anesthesia Procedure Notes (Signed)
Procedure Name: Intubation Date/Time: 02/07/2015 7:37 AM Performed by: Maryland Pink Pre-anesthesia Checklist: Patient identified, Emergency Drugs available, Suction available, Patient being monitored and Timeout performed Patient Re-evaluated:Patient Re-evaluated prior to inductionOxygen Delivery Method: Circle system utilized Preoxygenation: Pre-oxygenation with 100% oxygen Intubation Type: IV induction Ventilation: Two handed mask ventilation required Laryngoscope Size: Mac and 4 Grade View: Grade I Tube type: Oral Tube size: 7.5 mm Number of attempts: 1 Airway Equipment and Method: Stylet and LTA kit utilized Placement Confirmation: ETT inserted through vocal cords under direct vision,  positive ETCO2 and breath sounds checked- equal and bilateral Secured at: 23 cm Tube secured with: Tape Dental Injury: Teeth and Oropharynx as per pre-operative assessment

## 2015-02-07 NOTE — Anesthesia Postprocedure Evaluation (Signed)
Anesthesia Post Note  Patient: Gregory Wiley  Procedure(s) Performed: Procedure(s) (LRB): INTRAMEDULLARY (IM) NAIL INTERTROCHANTRIC RIGHT HIP (Right)  Patient location during evaluation: PACU Anesthesia Type: General Level of consciousness: awake Pain management: pain level controlled Vital Signs Assessment: post-procedure vital signs reviewed and stable Cardiovascular status: stable Anesthetic complications: no    Last Vitals:  Filed Vitals:   02/07/15 1014 02/07/15 1028  BP:  101/61  Pulse:  82  Temp: 36.4 C 36.7 C  Resp:  16    Last Pain:  Filed Vitals:   02/07/15 1029  PainSc: 4         RLE Motor Response: Purposeful movement RLE Sensation: Full sensation, Pain, No numbness, No tingling      EDWARDS,Sayan Aldava

## 2015-02-07 NOTE — Discharge Instructions (Signed)
INSTRUCTIONS o Remove items at home which could result in a fall. This includes throw rugs or furniture in walking pathways o ICE to the affected joint every three hours while awake for 30 minutes at a time, for at least the first 3-5 days, and then as needed for pain and swelling.  Continue to use ice for pain and swelling. You may notice swelling that will progress down to the foot and ankle.  This is normal after surgery.  Elevate your leg when you are not up walking on it.   o Continue to use the breathing machine you got in the hospital (incentive spirometer) which will help keep your temperature down.  It is common for your temperature to cycle up and down following surgery, especially at night when you are not up moving around and exerting yourself.  The breathing machine keeps your lungs expanded and your temperature down.   DIET:  As you were doing prior to hospitalization, we recommend a well-balanced diet.  DRESSING / WOUND CARE / SHOWERING  Keep the surgical dressing until follow up.  IF THE DRESSING FALLS OFF or the wound gets wet inside, change the dressing with sterile gauze.  Please use good hand washing techniques before changing the dressing.  Do not use any lotions or creams on the incision until instructed by your surgeon.    ACTIVITY  o Increase activity slowly as tolerated, but follow the weight bearing instructions below.   o No driving for 6 weeks or until further direction given by your physician.  You cannot drive while taking narcotics.  o No lifting or carrying greater than 10 lbs. until further directed by your surgeon. o Avoid periods of inactivity such as sitting longer than an hour when not asleep. This helps prevent blood clots.  o You may return to work once you are authorized by your doctor.     WEIGHT BEARING   Weight bearing as tolerated with assist device (walker, cane, etc) as directed, use it as long as suggested by your surgeon or therapist, typically  at least 4-6 weeks.   CONSTIPATION  Constipation is defined medically as fewer than three stools per week and severe constipation as less than one stool per week.  Even if you have a regular bowel pattern at home, your normal regimen is likely to be disrupted due to multiple reasons following surgery.  Combination of anesthesia, postoperative narcotics, change in appetite and fluid intake all can affect your bowels.   YOU MUST use at least one of the following options; they are listed in order of increasing strength to get the job done.  They are all available over the counter, and you may need to use some, POSSIBLY even all of these options:    Drink plenty of fluids (prune juice may be helpful) and high fiber foods Colace 100 mg by mouth twice a day  Senokot for constipation as directed and as needed Dulcolax (bisacodyl), take with full glass of water  Miralax (polyethylene glycol) once or twice a day as needed.  If you have tried all these things and are unable to have a bowel movement in the first 3-4 days after surgery call either your surgeon or your primary doctor.    If you experience loose stools or diarrhea, hold the medications until you stool forms back up.  If your symptoms do not get better within 1 week or if they get worse, check with your doctor.  If you experience "the worst  abdominal pain ever" or develop nausea or vomiting, please contact the office immediately for further recommendations for treatment.   ITCHING:  If you experience itching with your medications, try taking only a single pain pill, or even half a pain pill at a time.  You can also use Benadryl over the counter for itching or also to help with sleep.   TED HOSE STOCKINGS:  Use stockings on both legs until for at least 2 weeks or as directed by physician office. They may be removed at night for sleeping.  MEDICATIONS:  See your medication summary on the After Visit Summary that nursing will review with you.   You may have some home medications which will be placed on hold until you complete the course of blood thinner medication.  It is important for you to complete the blood thinner medication as prescribed.  PRECAUTIONS:  If you experience chest pain or shortness of breath - call 911 immediately for transfer to the hospital emergency department.   If you develop a fever greater that 101 F, purulent drainage from wound, increased redness or drainage from wound, foul odor from the wound/dressing, or calf pain - CONTACT YOUR SURGEON.                                                   FOLLOW-UP APPOINTMENTS:  If you do not already have a post-op appointment, please call the office for an appointment to be seen by your surgeon.  Guidelines for how soon to be seen are listed in your After Visit Summary, but are typically between 1-4 weeks after surgery.  MAKE SURE YOU:   Understand these instructions.   Get help right away if you are not doing well or get worse.    Thank you for letting us be a part of your medical care team.  It is a privilege we respect greatly.  We hope these instructions will help you stay on track for a fast and full recovery!   Information on my medicine - XARELTO (rivaroxaban)  WHY WAS XARELTO PRESCRIBED FOR YOU? Xarelto was prescribed to treat blood clots that may have been found in the veins of your legs (deep vein thrombosis) or in your lungs (pulmonary embolism) and to reduce the risk of them occurring again. Also for recent orthopedic surgery.  What do you need to know about Xarelto? Take one 20 mg tablet taken ONCE A DAY with your evening meal.  DO NOT stop taking Xarelto without talking to the health care provider who prescribed the medication.  Refill your prescription for 20 mg tablets before you run out.  After discharge, you should have regular check-up appointments with your healthcare provider that is prescribing your Xarelto.  In the future your dose  may need to be changed if your kidney function changes by a significant amount.  What do you do if you miss a dose? If you are taking Xarelto TWICE DAILY and you miss a dose, take it as soon as you remember. You may take two 15 mg tablets (total 30 mg) at the same time then resume your regularly scheduled 15 mg twice daily the next day.  If you are taking Xarelto ONCE DAILY and you miss a dose, take it as soon as you remember on the same day then continue your regularly scheduled once daily  regimen the next day. Do not take two doses of Xarelto at the same time.   Important Safety Information Xarelto is a blood thinner medicine that can cause bleeding. You should call your healthcare provider right away if you experience any of the following: ? Bleeding from an injury or your nose that does not stop. ? Unusual colored urine (red or dark brown) or unusual colored stools (red or black). ? Unusual bruising for unknown reasons. ? A serious fall or if you hit your head (even if there is no bleeding).  Some medicines may interact with Xarelto and might increase your risk of bleeding while on Xarelto. To help avoid this, consult your healthcare provider or pharmacist prior to using any new prescription or non-prescription medications, including herbals, vitamins, non-steroidal anti-inflammatory drugs (NSAIDs) and supplements.  This website has more information on Xarelto: [email or web address].[email or web address]

## 2015-02-08 DIAGNOSIS — Z86711 Personal history of pulmonary embolism: Secondary | ICD-10-CM | POA: Diagnosis not present

## 2015-02-08 DIAGNOSIS — Z955 Presence of coronary angioplasty implant and graft: Secondary | ICD-10-CM | POA: Diagnosis not present

## 2015-02-08 DIAGNOSIS — I2510 Atherosclerotic heart disease of native coronary artery without angina pectoris: Secondary | ICD-10-CM | POA: Diagnosis present

## 2015-02-08 DIAGNOSIS — Z951 Presence of aortocoronary bypass graft: Secondary | ICD-10-CM | POA: Diagnosis not present

## 2015-02-08 DIAGNOSIS — M84559A Pathological fracture in neoplastic disease, hip, unspecified, initial encounter for fracture: Secondary | ICD-10-CM | POA: Diagnosis present

## 2015-02-08 DIAGNOSIS — F329 Major depressive disorder, single episode, unspecified: Secondary | ICD-10-CM | POA: Diagnosis present

## 2015-02-08 DIAGNOSIS — I10 Essential (primary) hypertension: Secondary | ICD-10-CM | POA: Diagnosis present

## 2015-02-08 DIAGNOSIS — C9000 Multiple myeloma not having achieved remission: Secondary | ICD-10-CM | POA: Diagnosis present

## 2015-02-08 DIAGNOSIS — Z85828 Personal history of other malignant neoplasm of skin: Secondary | ICD-10-CM | POA: Diagnosis not present

## 2015-02-08 DIAGNOSIS — K219 Gastro-esophageal reflux disease without esophagitis: Secondary | ICD-10-CM | POA: Diagnosis present

## 2015-02-08 DIAGNOSIS — I252 Old myocardial infarction: Secondary | ICD-10-CM | POA: Diagnosis not present

## 2015-02-08 DIAGNOSIS — Z87891 Personal history of nicotine dependence: Secondary | ICD-10-CM | POA: Diagnosis not present

## 2015-02-08 DIAGNOSIS — E785 Hyperlipidemia, unspecified: Secondary | ICD-10-CM | POA: Diagnosis present

## 2015-02-08 DIAGNOSIS — Z7952 Long term (current) use of systemic steroids: Secondary | ICD-10-CM | POA: Diagnosis not present

## 2015-02-08 DIAGNOSIS — Z8673 Personal history of transient ischemic attack (TIA), and cerebral infarction without residual deficits: Secondary | ICD-10-CM | POA: Diagnosis not present

## 2015-02-08 DIAGNOSIS — G4733 Obstructive sleep apnea (adult) (pediatric): Secondary | ICD-10-CM | POA: Diagnosis present

## 2015-02-08 DIAGNOSIS — Z7901 Long term (current) use of anticoagulants: Secondary | ICD-10-CM | POA: Diagnosis not present

## 2015-02-08 NOTE — Progress Notes (Signed)
     Subjective:  POD#1 IM nail of R hip. Patient reports pain as mild to moderate.  Resting comfortably in bed this morning.  Has not been out of bed yet.  Will see how he mobilizes with PT, but suspect he will be ready for discharge today.   Objective:   VITALS:   Filed Vitals:   02/07/15 1028 02/07/15 2224 02/08/15 0138 02/08/15 0640  BP: 101/61 101/52 96/51 112/59  Pulse: 82 58 51 59  Temp: 98 F (36.7 C) 98.5 F (36.9 C) 97.8 F (36.6 C) 98.3 F (36.8 C)  TempSrc: Oral Oral Oral Oral  Resp: 16 16 16 16   SpO2: 100% 97% 96% 94%    Neurologically intact ABD soft Neurovascular intact Sensation intact distally Intact pulses distally Dorsiflexion/Plantar flexion intact Incision: dressing C/D/I   Lab Results  Component Value Date   WBC 7.8 01/31/2015   HGB 13.0 01/31/2015   HCT 40.9 01/31/2015   MCV 91.5 01/31/2015   PLT 193 01/31/2015   BMET    Component Value Date/Time   NA 142 01/31/2015 1411   K 3.8 01/31/2015 1411   CL 110 01/31/2015 1411   CO2 28 01/31/2015 1411   GLUCOSE 111* 01/31/2015 1411   BUN 9 01/31/2015 1411   CREATININE 0.97 01/31/2015 1411   CREATININE 0.83 09/13/2014 1032   CALCIUM 8.5* 01/31/2015 1411   GFRNONAA >60 01/31/2015 1411   GFRNONAA >89 09/13/2014 1032   GFRAA >60 01/31/2015 1411   GFRAA >89 09/13/2014 1032     Assessment/Plan: 1 Day Post-Op   Active Problems:   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

## 2015-02-08 NOTE — Evaluation (Signed)
Physical Therapy Evaluation Patient Details Name: Gregory Wiley MRN: [id number] DOB: Feb 25, 1951 Today's Date: 02/08/2015   History of Present Illness  Impending Rt hip fx, s/p IM nail intertrochanteric Rt hip. PMH: CVA, anxiety, MI, pulmonary emboli  Clinical Impression  Patient is s/p above surgery resulting in functional limitations due to the deficits listed below (see PT Problem List). Patient will benefit from skilled PT to increase their independence and safety with mobility to allow discharge to home with family assistance. Patient reporting that he is moving better now than before surgery. The patient also states that he is planning to use his wheelchair to enter him with the assistance of his spouse. Patient states that he feels confident with his mobility level at this time as far as going home. Will continue to follow to progress mobility and independence.        Follow Up Recommendations Home health PT;Supervision for mobility/OOB    Equipment Recommendations  None recommended by PT;Other (comment) (patient reports having all needed equipment)    Recommendations for Other Services       Precautions / Restrictions Precautions Precautions: Fall Required Braces or Orthoses: Other Brace/Splint Other Brace/Splint: uses AFO or Lt, does not have here at hospital Restrictions Weight Bearing Restrictions: Yes RLE Weight Bearing: Weight bearing as tolerated      Mobility  Bed Mobility Overal bed mobility: Needs Assistance Bed Mobility: Supine to Sit     Supine to sit: Mod assist;HOB elevated     General bed mobility comments: assist with Rt LE and trunk with supine to sit.   Transfers Overall transfer level: Needs assistance Equipment used: Hemi-walker Transfers: Sit to/from Stand Sit to Stand: Min guard         General transfer comment: no loss of balance with sit/stand.   Ambulation/Gait Ambulation/Gait assistance: Min guard Ambulation Distance (Feet): 20  Feet Assistive device: Hemi-walker   Gait velocity: decreased   General Gait Details: Noted tone through LLE during gait, patient able to bear partial weight through LLE. Hip hike and foot drop noted. Stable pattern using hemiwalker and weight bearing through Rt LE.   Stairs            Wheelchair Mobility    Modified Rankin (Stroke Patients Only)       Balance Overall balance assessment: Needs assistance Sitting-balance support: No upper extremity supported Sitting balance-Leahy Scale: Good     Standing balance support: Single extremity supported Standing balance-Leahy Scale: Fair Standing balance comment: using hemiwalker                             Pertinent Vitals/Pain Pain Assessment: No/denies pain    Home Living Family/patient expects to be discharged to:: Private residence Living Arrangements: Spouse/significant other Available Help at Discharge: Family Type of Home: House Home Access: Ramped entrance     Home Layout: One level Home Equipment: Cane - quad;Bedside commode;Shower seat;Wheelchair - Education officer, community - power;Hospital bed Additional Comments: Wife assisting with mobility prior to surgery. Using quad cane for short ambulation in the home and w/c for longer distances and to enter home via ramp.     Prior Function Level of Independence: Needs assistance   Gait / Transfers Assistance Needed: using quad cane, w/c, hospital bed. Physical assistance by spouse as needed.            Hand Dominance        Extremity/Trunk Assessment   Upper Extremity Assessment: LUE  deficits/detail       LUE Deficits / Details: no active motion noted through LUE.   Lower Extremity Assessment: LLE deficits/detail   LLE Deficits / Details: trace active motion through LLE, able to bear partial weight during ambulation.     Communication   Communication: No difficulties  Cognition Arousal/Alertness: Awake/alert Behavior During Therapy: WFL  for tasks assessed/performed Overall Cognitive Status: Within Functional Limits for tasks assessed                      General Comments      Exercises        Assessment/Plan    PT Assessment Patient needs continued PT services  PT Diagnosis Difficulty walking;Abnormality of gait;Generalized weakness   PT Problem List Decreased strength;Decreased range of motion;Decreased activity tolerance;Decreased balance;Decreased mobility  PT Treatment Interventions DME instruction;Gait training;Stair training;Functional mobility training;Therapeutic activities;Therapeutic exercise;Balance training;Patient/family education   PT Goals (Current goals can be found in the Care Plan section) Acute Rehab PT Goals Patient Stated Goal: go home today PT Goal Formulation: With patient Time For Goal Achievement: 02/22/15 Potential to Achieve Goals: Good    Frequency Min 5X/week   Barriers to discharge        Co-evaluation               End of Session Equipment Utilized During Treatment: Gait belt Activity Tolerance: Patient limited by pain;Patient limited by fatigue Patient left: in chair;with call bell/phone within reach;with family/visitor present Nurse Communication: Mobility status    Functional Assessment Tool Used: clinical judgment Functional Limitation: Mobility: Walking and moving around Mobility: Walking and Moving Around Current Status [id number]): At least 60 percent but less than 80 percent impaired, limited or restricted Mobility: Walking and Moving Around Goal Status [phone]): At least 40 percent but less than 60 percent impaired, limited or restricted    Time: 0930-0959 PT Time Calculation (min) (ACUTE ONLY): 29 min   Charges:   PT Evaluation $Initial PT Evaluation Tier I: 1 Procedure PT Treatments $Gait Training: 8-22 mins   PT G Codes:   PT G-Codes **NOT FOR INPATIENT CLASS** Functional Assessment Tool Used: clinical judgment Functional Limitation:  Mobility: Walking and moving around Mobility: Walking and Moving Around Current Status [id number]): At least 60 percent but less than 80 percent impaired, limited or restricted Mobility: Walking and Moving Around Goal Status [phone]): At least 40 percent but less than 60 percent impaired, limited or restricted    Cassell Clement, PT, CSCS Pager [phone] Office [phone]  02/08/2015, 10:17 AM

## 2015-02-08 NOTE — Progress Notes (Signed)
OT Cancellation Note  Patient Details Name: CHRSTOPHER DOWTIN MRN: [id number] DOB: 1951-03-05   Cancelled Treatment:    Reason Eval/Treat Not Completed: PT screened, no needs identified, will sign off. Per PT, pt with no acute OT needs identified; pt at baseline level with ADLs and caregiver to provide 24/7 assist upon return home. Pt has all the equipment he will need for d/c home. Will sign off for OT at this time. Please re-consult if change in medical status occurs. Thank you for this referral.   Binnie Kand M.S., OTR/L Pager: [phone]  02/08/2015, 10:35 AM

## 2015-02-08 NOTE — Progress Notes (Signed)
Pt ready for discharge. Discharge instructions/education reviewed with pt and his wife and all questions/concerns answered. Pt will be transported out via wheelchair to wife's car.

## 2015-02-08 NOTE — Care Management Note (Signed)
Case Management Note  Patient Details  Name: Gregory Wiley MRN: [id number] Date of Birth: [birth year]  Subjective/Objective:    64 yr old male s/p right hip IM Nailing                Action/Plan: Case manager spoke with patient concerning [place] and DME needs at discharge. Choice was offered. Patient was preoperatively setup with [facility], no changes.   Expected Discharge Date:   02/08/15               Expected Discharge Plan:   Home with Home Health  In-House Referral:  NA  Discharge planning Services  CM Consult  Post Acute Care Choice:  Home Health Choice offered to:  Patient  DME Arranged:  N/A (Patient has rolling walker and 3in1) DME Agency:     HH Arranged:  PT HH Agency:  [place]  Status of Service:  Completed, signed off  Medicare Important Message Given:    Date Medicare IM Given:    Medicare IM give by:    Date Additional Medicare IM Given:    Additional Medicare Important Message give by:     If discussed at [place] of Stay Meetings, dates discussed:    Additional Comments:  Ninfa Meeker, RN 02/08/2015, 10:36 AM

## 2015-02-08 NOTE — Discharge Summary (Signed)
Physician Discharge Summary  Patient ID: Gregory Wiley MRN: [id number] DOB/AGE: 65-24-1952 65 y.o.  Admit date: 02/07/2015 Discharge date: 02/08/2015  Admission Diagnoses:  Pathologic fracture of femur [facility])  Discharge Diagnoses:  Principal Problem:   Pathologic fracture of femur [facility])   Past Medical History  Diagnosis Date  . Coronary artery disease     stent, then CABG 07/20/10  . Hypertension   . Hyperlipidemia   . Obesity (BMI 30-39.9)   . H/O cardiomyopathy     ischemic, now last echo 07/30/11, EF 38%W  . Diverticulosis   . Internal hemorrhoids   . Tubular adenoma of colon   . Plantar fasciitis of left foot   . Basal cell carcinoma of skin of left ankle   . NSTEMI (non-ST elevated myocardial infarction) ([place])     NSTEMI- last cath 06/2011-Stent to LCX-DES, last nuc 07/30/11 low risk  . MI (myocardial infarction) ([place])     "I had 4 before I retired in 2006" (11/11/2014)  . Pulmonary embolism ([place]) 03/2014  . GERD (gastroesophageal reflux disease)   . Bleeding stomach ulcer 2015  . Daily headache     "for the past month" (11/11/2014)  . Stroke ([place]) 01/2014    "no control of LUE, can slightly LLE; memory problems since" (11/11/2014)  . Arthritis     "back" (11/11/2014)  . Gout   . Depression   . Pneumonia     hosp. 12/2014  . Anxiety   . History of hiatal hernia   . 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 OSA on CPAP     since July 2013- uses CPAP sometimes , states he has lost 147 lbs. since stroke & doesn't use the CPAP as much as he use to.   . Adrenal insufficiency ([place])     Surgeries: Procedure(s): INTRAMEDULLARY (IM) NAIL INTERTROCHANTRIC RIGHT HIP on 02/07/2015   Consultants (if any):    Discharged Condition: Improved  Hospital Course: Gregory Wiley is an 64 y.o. male who was admitted 02/07/2015 with a diagnosis of Pathologic fracture of femur ([place]) and went to the operating room on 02/07/2015 and underwent the above named  procedures.    He was given perioperative antibiotics:      Anti-infectives    Start     Dose/Rate Route Frequency Ordered Stop   02/07/15 1300  ceFAZolin (ANCEF) IVPB 2 g/50 mL premix     2 g 100 mL/hr over 30 Minutes Intravenous Every 6 hours 02/07/15 1027 02/07/15 1859   02/07/15 0700  ceFAZolin (ANCEF) IVPB 2 g/50 mL premix     2 g 100 mL/hr over 30 Minutes Intravenous To ShortStay Surgical 02/06/15 1308 02/07/15 0740    .  He was given sequential compression devices, early ambulation, and Xarelto for DVT prophylaxis.  He benefited maximally from the hospital stay and there were no complications.    Recent vital signs:  Filed Vitals:   02/08/15 0138 02/08/15 0640  BP: 96/51 112/59  Pulse: 51 59  Temp: 97.8 F (36.6 C) 98.3 F (36.8 C)  Resp: 16 16    Recent laboratory studies:  Lab Results  Component Value Date   HGB 13.0 01/31/2015   HGB 11.2* 12/27/2014   HGB 12.1* 12/26/2014   Lab Results  Component Value Date   WBC 7.8 01/31/2015   PLT 193 01/31/2015   Lab Results  Component Value Date   INR 1.14 02/07/2015   Lab Results  Component Value Date   NA 142 01/31/2015   K 3.8 01/31/2015   CL 110 01/31/2015   CO2 28 01/31/2015   BUN 9 01/31/2015   CREATININE 0.97 01/31/2015   GLUCOSE 111* 01/31/2015    Discharge Medications:     Medication List    TAKE these medications        acetaminophen 325 MG tablet  Commonly known as:  TYLENOL  Take 1 tablet (325 mg total) by mouth every 6 (six) hours as needed for mild pain.     ALPRAZolam 0.25 MG tablet  Commonly known as:  XANAX  Take 0.5 tablets (0.125 mg total) by mouth every 8 (eight) hours as needed for anxiety.     colchicine 0.6 MG tablet  Take 0.6 mg by mouth as needed (for gout).     ENSURE  Take 237 mLs by mouth daily.     fludrocortisone 0.1 MG tablet  Commonly known as:  FLORINEF  Take 1 tablet (0.1 mg total) by mouth 2 (two) times daily.     fluticasone 50 MCG/ACT nasal spray   Commonly known as:  FLONASE  Place 2 sprays into both nostrils daily as needed for allergies or rhinitis.     HYDROcodone-acetaminophen 5-325 MG tablet  Commonly known as:  NORCO  Take 1-2 tablets by mouth every 6 (six) hours as needed for moderate pain.     hydrocortisone 25 MG suppository  Commonly known as:  ANUSOL-HC  Place 25 mg rectally as needed for hemorrhoids or itching.     meclizine 25 MG tablet  Commonly known as:  ANTIVERT  Take 25 mg by mouth 3 (three) times daily as needed for dizziness.     Melatonin 10 MG Caps  Take 1 capsule by mouth at bedtime as needed (sleep).     midodrine 2.5 MG tablet  Commonly known as:  PROAMATINE  Take 1 tablet (2.5 mg total) by mouth 3 (three) times daily with meals.     nitroGLYCERIN 0.4 MG SL tablet  Commonly known as:  NITROSTAT  Place 0.4 mg under the tongue every 5 (five) minutes as needed. For chest pain     OVER THE COUNTER MEDICATION  Inhale 1 application into the lungs at bedtime. C-PAP     pantoprazole 40 MG tablet  Commonly known as:  PROTONIX  Take 1 tablet (40 mg total) by mouth 2 (two) times daily. KEEP OV.     potassium chloride 10 MEQ tablet  Commonly known as:  K-DUR  TAKE 4 TABLETS (40 MEQ TOTAL) BY MOUTH DAILY.     predniSONE 5 MG tablet  Commonly known as:  DELTASONE  Take 5 mg by mouth daily with breakfast.     promethazine 12.5 MG tablet  Commonly known as:  PHENERGAN  Take 1-2 tablets (12.5-25 mg total) by mouth every 6 (six) hours as needed for nausea or vomiting.     rivaroxaban 20 MG Tabs tablet  Commonly known as:  XARELTO  Take 1 tablet (20 mg total) by mouth daily with supper.     rosuvastatin 40 MG tablet  Commonly known as:  CRESTOR  Take 40 mg by mouth at bedtime.     SENOKOT PO  Take 2 tablets by mouth 2 (two) times daily.     sertraline 50 MG tablet  Commonly known as:  ZOLOFT  Take 1 tablet (50 mg total) by mouth daily.     sodium chloride 1 G tablet  Take 1 tablet (1  g  total) by mouth 2 (two) times daily with a meal.        Diagnostic Studies: Dg Chest 1 View  02/06/2015  CLINICAL DATA:  Pneumonia follow-up. Ex-smoker. No reported cough or chest pain. EXAM: CHEST 1 VIEW COMPARISON:  12/23/2014 FINDINGS: The left base opacity noted on prior studies has resolved. Lungs are now clear. No pleural effusion or pneumothorax. Changes from CABG surgery are stable. No mediastinal or hilar masses or evidence of adenopathy. Cardiac silhouette normal in size. Skeletal structures are demineralized but grossly intact. IMPRESSION: 1. No acute cardiopulmonary disease. No evidence of residual pneumonia. Electronically Signed   By: Lajean Manes M.D.   On: 02/06/2015 15:57   Dg Hip Operative Unilat With Pelvis Right  02/07/2015  CLINICAL DATA:  Proximal right femoral lesion EXAM: OPERATIVE RIGHT HIP WITH PELVIS COMPARISON:  None. FLUOROSCOPY TIME:  Radiation Exposure Index (as provided by the fluoroscopic device): Not available If the device does not provide the exposure index: Fluoroscopy Time:  32 seconds Number of Acquired Images:  3 FINDINGS: Medullary rod is noted with proximal fixation screw traversing the femoral neck. The previously seen lytic lesion a in the proximal femur is again identified and stable. No other focal abnormality is noted. IMPRESSION: Status post medullary rod and fixation screw placement traversing a known proximal right femoral lesion. Electronically Signed   By: Inez Catalina M.D.   On: 02/07/2015 08:32   Dg Femur Port, Min 2 Views Right  02/07/2015  CLINICAL DATA:  Postop EXAM: RIGHT FEMUR PORTABLE 1 VIEW COMPARISON:  None. FINDINGS: Dynamic compression screw and intra medullary rod transfix the proximal right femur. Hardware traverses the lytic lesion in the right femoral neck. No evidence of fracture or dislocation. There are no distal interlocking screws. Anatomic alignment. IMPRESSION: Internal fixation in the proximal femur for a lytic femoral neck  lesion. Electronically Signed   By: Marybelle Killings M.D.   On: 02/07/2015 10:33    Disposition: 06-Home-Health Care Svc  Discharge Instructions    Weight bearing as tolerated    Complete by:  As directed   Laterality:  right  Extremity:  Lower           Follow-up Information    Follow up with MURPHY, TIMOTHY D, MD In 10 days.   Specialty:  Orthopedic Surgery   Contact information:   [name] [id number] [phone]        Signed: Gae Dry 02/08/2015, 7:36 AM Cell [phone]

## 2015-02-10 LAB — CUP PACEART REMOTE DEVICE CHECK: Date Time Interrogation Session: 20161026233750

## 2015-02-10 NOTE — Progress Notes (Signed)
Carelink summary report received. Battery status OK. Normal device function. No new symptom episodes, tachy episodes, brady, or pause episodes. No new AF episodes. Monthly summary reports and ROV with JA PRN. 

## 2015-02-13 ENCOUNTER — Encounter (HOSPITAL_COMMUNITY): Payer: Self-pay | Admitting: Orthopedic Surgery

## 2015-02-13 ENCOUNTER — Ambulatory Visit (INDEPENDENT_AMBULATORY_CARE_PROVIDER_SITE_OTHER): Payer: Medicare Other | Admitting: *Deleted

## 2015-02-13 ENCOUNTER — Telehealth: Payer: Self-pay | Admitting: Endocrinology

## 2015-02-13 DIAGNOSIS — I63411 Cerebral infarction due to embolism of right middle cerebral artery: Secondary | ICD-10-CM

## 2015-02-13 NOTE — Telephone Encounter (Signed)
Rx sent electronically on 11/28.

## 2015-02-13 NOTE — Telephone Encounter (Signed)
Patient Name: Gregory Wiley Gender: Male DOB: Aug 05, 1950 Age: 64 Y 1 M 26 D Return Phone Number: [phone] (Primary) Address: City/State/Zip: [place] Client [facility] Night - Client Client Site [facility] Physician Renato Shin Contact Type Call Call Type Triage / Clinical Caller Name [name] Relationship To Patient Spouse Return Phone Number [phone] (Primary) Chief Complaint Prescription Refill or Medication Request (non symptomatic) Initial Comment Needs Rx, supposed to take daily and he is out Nurse Assessment Nurse: Venetia Maxon, RN, Manuela Schwartz Date/Time Eilene Ghazi Time): 02/10/2015 2:59:13 PM Confirm and document reason for call. If symptomatic, describe symptoms. ---Needs Rx, supposed to take daily and he is out He came home from the hospital yesterday at 4pm Had rod placed in right hip. 97.8 orally He has urinated pain level is 0/10 took pain med 2 hrs ago 107/62 HR 73 . Has the patient traveled out of the country within the last 30 days? ---No Does the patient have any new or worsening symptoms? ---No Please document clinical information provided and list any resource used. ---RN will page oncall MD for this pt has adrenal insuffiency and endocrine MD orders the Midodrine for low B/P

## 2015-02-14 ENCOUNTER — Encounter: Payer: Self-pay | Admitting: Endocrinology

## 2015-02-14 ENCOUNTER — Ambulatory Visit (INDEPENDENT_AMBULATORY_CARE_PROVIDER_SITE_OTHER): Payer: Medicare Other | Admitting: Endocrinology

## 2015-02-14 VITALS — BP 112/72 | HR 91 | Temp 97.6°F | Ht 73.0 in

## 2015-02-14 DIAGNOSIS — I959 Hypotension, unspecified: Secondary | ICD-10-CM | POA: Diagnosis not present

## 2015-02-14 MED ORDER — PREDNISONE 2.5 MG PO TABS: 2.5000 mg | ORAL_TABLET | Freq: Every day | ORAL | Status: DC

## 2015-02-14 NOTE — Patient Instructions (Addendum)
Please continue the same fludrocortisone and potassuim Please reduce the prednisone.  i have sent a prescription to your pharmacy.    Please come back for a follow-up appointment in 2 weeks, when we'll retest the adrenal glands.   Please call sooner if the dizziness worsens.

## 2015-02-14 NOTE — Progress Notes (Signed)
Subjective:    Patient ID: Gregory Wiley, male    DOB: 05/22/1950, 64 y.o.   MRN: [id number]  HPI Pt returns for f/u of hypotension (he was in the hospital in Oct of 2016, with pneumonia and sepsis; prior to that, he had been hospitalized with hypotension.  cortisol was low, but ACTH test was done after she had already received IV-HC; plan is to taper off prednisone, and manage hypotension with florinef).  He had right hip surgery last week.  pt states he is recovering well.  He has intermittent lightheadedness.  he brings a record of his BP's which i have reviewed today.  It varies from 96-135/52-78. Past Medical History  Diagnosis Date  . Coronary artery disease     stent, then CABG 07/20/10  . Hypertension   . Hyperlipidemia   . Obesity (BMI 30-39.9)   . H/O cardiomyopathy     ischemic, now last echo 07/30/11, EF 38%W  . Diverticulosis   . Internal hemorrhoids   . Tubular adenoma of colon   . Plantar fasciitis of left foot   . Basal cell carcinoma of skin of left ankle   . NSTEMI (non-ST elevated myocardial infarction) ([place])     NSTEMI- last cath 06/2011-Stent to LCX-DES, last nuc 07/30/11 low risk  . MI (myocardial infarction) ([place])     "I had 4 before I retired in 2006" (11/11/2014)  . Pulmonary embolism ([place]) 03/2014  . GERD (gastroesophageal reflux disease)   . Bleeding stomach ulcer 2015  . Daily headache     "for the past month" (11/11/2014)  . Stroke ([place]) 01/2014    "no control of LUE, can slightly LLE; memory problems since" (11/11/2014)  . Arthritis     "back" (11/11/2014)  . Gout   . Depression   . Pneumonia     hosp. 12/2014  . Anxiety   . History of hiatal hernia   . 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 OSA on CPAP     since July 2013- uses CPAP sometimes , states he has lost 147 lbs. since stroke & doesn't use the CPAP as much as he use to.   . Adrenal insufficiency [facility])     Past Surgical History  Procedure Laterality Date  .  Coronary artery bypass graft  07/20/2010     LIMA-LAD; VG-ACUTE MARG of RCA; Seq VG-distal RCA & then pda  . Coronary angioplasty with stent placement  07/01/2011    DES-Resolute to native LCX  . Coronary angioplasty with stent placement  12/01/2007    COMPLEX 5 LESION PCI INCLUDING CUTTING BALLOON AND 4 CYPHER DESs  . Knee arthroscopy Right   . Hand surgery Right     to take glass out  . Colonoscopy N/A 02/10/2013    Procedure: COLONOSCOPY;  Surgeon: Ladene Artist, MD;  Location: [facility] ENDOSCOPY;  Service: Endoscopy;  Laterality: N/A;  . Retinal detachment surgery Right   . Loop recorder implant  02/15/2014    MDT LINQ implanted by Dr Rayann Heman for cryptogenic stroke  . Tee without cardioversion N/A 02/15/2014    Procedure: TRANSESOPHAGEAL ECHOCARDIOGRAM (TEE);  Surgeon: Josue Hector, MD;  Location: [facility] ENDOSCOPY;  Service: Cardiovascular;  Laterality: N/A;  . Left heart catheterization with coronary/graft angiogram  07/01/2011    Procedure: LEFT HEART CATHETERIZATION WITH Beatrix Fetters;  Surgeon: Sanda Klein, MD;  Location: [place] CATH LAB;  Service: Cardiovascular;;  . Percutaneous coronary stent intervention (pci-s)  Right 07/01/2011    Procedure: PERCUTANEOUS CORONARY STENT INTERVENTION (PCI-S);  Surgeon: Sanda Klein, MD;  Location: [facility] CATH LAB;  Service: Cardiovascular;  Laterality: Right;  . Umbilical hernia repair  2006  . Hernia repair    . Basal cell carcinoma excision Left 2015    ankle  . Esophagogastroduodenoscopy  01/2014    gastric ulcer, erosive gastroduodenitis, H Pylori negative.   . Esophagogastroduodenoscopy (egd) with propofol N/A 11/14/2014    Procedure: ESOPHAGOGASTRODUODENOSCOPY (EGD) WITH PROPOFOL;  Surgeon: Milus Banister, MD;  Location: [place];  Service: Endoscopy;  Laterality: N/A;  . Intramedullary (im) nail intertrochanteric Right 02/07/2015    Procedure: INTRAMEDULLARY (IM) NAIL INTERTROCHANTRIC RIGHT HIP;  Surgeon: Renette Butters, MD;  Location:  [place];  Service: Orthopedics;  Laterality: Right;    Social History   Social History  . Marital Status: Married    Spouse Name: N/A  . Number of Children: 5  . Years of Education: N/A   Occupational History  .      works at the [employer] [place] Topics  . Smoking status: Former Smoker -- 2.00 packs/day for 4 years    Types: Cigarettes    Quit date: 01/30/1969  . Smokeless tobacco: Never Used     Comment: "quit smoking cigarettes  in the 1970's"  . Alcohol Use: 0.0 oz/week    0 Standard drinks or equivalent per week     Comment: 11/11/2014 "1-2 times per year"  . Drug Use: Yes    Special: Marijuana     Comment: "smoked some pot in college"  . Sexual Activity: Not Currently   Other Topics Concern  . 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      Current Outpatient Prescriptions on File Prior to Visit  Medication Sig Dispense Refill  . acetaminophen (TYLENOL) 325 MG tablet Take 1 tablet (325 mg total) by mouth every 6 (six) hours as needed for mild pain.    Marland Kitchen ALPRAZolam (XANAX) 0.25 MG tablet Take 0.5 tablets (0.125 mg total) by mouth every 8 (eight) hours as needed for anxiety. 45 tablet 5  . colchicine 0.6 MG tablet Take 0.6 mg by mouth as needed (for gout).   0  . ENSURE (ENSURE) Take 237 mLs by mouth daily.    . fludrocortisone (FLORINEF) 0.1 MG tablet Take 1 tablet (0.1 mg total) by mouth 2 (two) times daily. 60 tablet 2  . fluticasone (FLONASE) 50 MCG/ACT nasal spray Place 2 sprays into both nostrils daily as needed for allergies or rhinitis.    Marland Kitchen HYDROcodone-acetaminophen (NORCO) 5-325 MG tablet Take 1-2 tablets by mouth every 6 (six) hours as needed for moderate pain. 90 tablet 0  . hydrocortisone (ANUSOL-HC) 25 MG suppository Place 25 mg rectally as needed for hemorrhoids or  itching.    . meclizine (ANTIVERT) 25 MG tablet Take 25 mg by mouth 3 (three) times daily as needed for dizziness.    . Melatonin 10 MG CAPS Take 1 capsule by mouth at bedtime as needed (sleep).     . midodrine (PROAMATINE) 2.5 MG tablet Take 1 tablet (2.5 mg total) by mouth 3 (three) times daily with meals. 90 tablet 0  . nitroGLYCERIN (NITROSTAT) 0.4 MG SL tablet Place 0.4 mg under the  tongue every 5 (five) minutes as needed. For chest pain    . OVER THE COUNTER MEDICATION Inhale 1 application into the lungs at bedtime. C-PAP    . pantoprazole (PROTONIX) 40 MG tablet Take 1 tablet (40 mg total) by mouth 2 (two) times daily. KEEP OV. 60 tablet 0  . potassium chloride (K-DUR) 10 MEQ tablet TAKE 4 TABLETS (40 MEQ TOTAL) BY MOUTH DAILY. 60 tablet 2  . promethazine (PHENERGAN) 12.5 MG tablet Take 1-2 tablets (12.5-25 mg total) by mouth every 6 (six) hours as needed for nausea or vomiting. 30 tablet 0  . rivaroxaban (XARELTO) 20 MG TABS tablet Take 1 tablet (20 mg total) by mouth daily with supper. 30 tablet 11  . rosuvastatin (CRESTOR) 40 MG tablet Take 40 mg by mouth at bedtime.    . Sennosides (SENOKOT PO) Take 2 tablets by mouth 2 (two) times daily.     . sertraline (ZOLOFT) 50 MG tablet Take 1 tablet (50 mg total) by mouth daily. 30 tablet 11  . sodium chloride 1 G tablet Take 1 tablet (1 g total) by mouth 2 (two) times daily with a meal. 60 tablet 2   No current facility-administered medications on file prior to visit.    Allergies  Allergen Reactions  . Diphenhydramine Hcl Anaphylaxis    Family History  Problem Relation Age of Onset  . Diabetes type II Mother   . Coronary artery disease    . Colon cancer Neg Hx   . CAD Father 42    CABG, PCI, died at 69  . Heart attack Father 11  . Hypertension Brother   . Diabetes Brother   . Hyperlipidemia Brother   . Brain cancer Maternal Grandmother   . COPD Paternal Grandfather   . Other      denies family history of DVT/PE  . Stroke Neg  Hx   . Other Neg Hx     adrenal dz    BP 112/72 mmHg  Pulse 91  Temp(Src) 97.6 F (36.4 C) (Oral)  Ht 6\' 1"  (1.854 m)  SpO2 97%  Review of Systems Denies LOC    Objective:   Physical Exam VITAL SIGNS:  See vs page GENERAL: no distress.  In wheelchair LUNGS:  Clear to auscultation HEART:  Regular rate and rhythm without murmurs noted. Normal S1,S2.   Ext: no edema  Lab Results  Component Value Date   CREATININE 0.97 01/31/2015   BUN 9 01/31/2015   NA 142 01/31/2015   K 3.8 01/31/2015   CL 110 01/31/2015   CO2 28 01/31/2015       Assessment & Plan:  Hypocortisolism: we discussed tapering schedule.  He wants to taper as quickly as possible Hypotension: the fact that he has intermittent hypotension on these meds strongly suggests that the hypotension is not of adrenal etiology.    Patient is advised the following: Patient Instructions  Please continue the same fludrocortisone and potassuim Please reduce the prednisone.  i have sent a prescription to your pharmacy.    Please come back for a follow-up appointment in 2 weeks, when we'll retest the adrenal glands.   Please call sooner if the dizziness worsens.

## 2015-02-15 NOTE — Progress Notes (Signed)
LOOP RECORDER  

## 2015-02-28 ENCOUNTER — Ambulatory Visit (INDEPENDENT_AMBULATORY_CARE_PROVIDER_SITE_OTHER): Payer: Medicare Other | Admitting: Endocrinology

## 2015-02-28 ENCOUNTER — Encounter: Payer: Self-pay | Admitting: Endocrinology

## 2015-02-28 VITALS — BP 122/80 | HR 78 | Temp 98.0°F | Ht 73.0 in

## 2015-02-28 DIAGNOSIS — I959 Hypotension, unspecified: Secondary | ICD-10-CM

## 2015-02-28 LAB — CORTISOL
Cortisol, Plasma: 6.4 ug/dL
Cortisol, Plasma: 12.7 ug/dL

## 2015-02-28 MED ORDER — COSYNTROPIN 0.25 MG IJ SOLR
0.2500 mg | Freq: Once | INTRAMUSCULAR | Status: AC
  Administered 2015-02-28: 0.25 mg via INTRAMUSCULAR

## 2015-02-28 MED ORDER — PREDNISONE 1 MG PO TABS: 1.0000 mg | ORAL_TABLET | Freq: Every day | ORAL | Status: DC

## 2015-02-28 NOTE — Progress Notes (Signed)
Subjective:    Patient ID: Gregory Wiley, male    DOB: 1950/10/26, 64 y.o.   MRN: [id number]  HPI Pt returns for f/u of hypotension (he was in the hospital in Oct of 2016, with pneumonia and sepsis; prior to that, he had been hospitalized with hypotension.  cortisol was low, but ACTH test was done after she had already received IV-HC; plan is to taper off prednisone, and manage hypotension with florinef).  pt states he feels no different since the prednisone was decreased.  Denies dizziness.   Past Medical History  Diagnosis Date  . Coronary artery disease     stent, then CABG 07/20/10  . Hypertension   . Hyperlipidemia   . Obesity (BMI 30-39.9)   . H/O cardiomyopathy     ischemic, now last echo 07/30/11, EF 38%W  . Diverticulosis   . Internal hemorrhoids   . Tubular adenoma of colon   . Plantar fasciitis of left foot   . Basal cell carcinoma of skin of left ankle   . NSTEMI (non-ST elevated myocardial infarction) ([place])     NSTEMI- last cath 06/2011-Stent to LCX-DES, last nuc 07/30/11 low risk  . MI (myocardial infarction) ([place])     "I had 4 before I retired in 2006" (11/11/2014)  . Pulmonary embolism ([place]) 03/2014  . GERD (gastroesophageal reflux disease)   . Bleeding stomach ulcer 2015  . Daily headache     "for the past month" (11/11/2014)  . Stroke ([place]) 01/2014    "no control of LUE, can slightly LLE; memory problems since" (11/11/2014)  . Arthritis     "back" (11/11/2014)  . Gout   . Depression   . Pneumonia     hosp. 12/2014  . Anxiety   . History of hiatal hernia   . 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 OSA on CPAP     since July 2013- uses CPAP sometimes , states he has lost 147 lbs. since stroke & doesn't use the CPAP as much as he use to.   . Adrenal insufficiency [facility])     Past Surgical History  Procedure Laterality Date  . Coronary artery bypass graft  07/20/2010     LIMA-LAD; VG-ACUTE MARG of RCA; Seq VG-distal RCA & then pda  .  Coronary angioplasty with stent placement  07/01/2011    DES-Resolute to native LCX  . Coronary angioplasty with stent placement  12/01/2007    COMPLEX 5 LESION PCI INCLUDING CUTTING BALLOON AND 4 CYPHER DESs  . Knee arthroscopy Right   . Hand surgery Right     to take glass out  . Colonoscopy N/A 02/10/2013    Procedure: COLONOSCOPY;  Surgeon: Ladene Artist, MD;  [facility] ENDOSCOPY;  Service: Endoscopy;  Laterality: N/A;  . Retinal detachment surgery Right   . Loop recorder implant  02/15/2014    MDT LINQ implanted by Dr Rayann Heman for cryptogenic stroke  . Tee without cardioversion N/A 02/15/2014    Procedure: TRANSESOPHAGEAL ECHOCARDIOGRAM (TEE);  Surgeon: Josue Hector, MD;  Location: [facility] ENDOSCOPY;  Service: Cardiovascular;  Laterality: N/A;  . Left heart catheterization with coronary/graft angiogram  07/01/2011    Procedure: LEFT HEART CATHETERIZATION WITH Beatrix Fetters;  Surgeon: Sanda Klein, MD;  Location: [place] CATH LAB;  Service: Cardiovascular;;  . Percutaneous coronary stent intervention (pci-s) Right 07/01/2011    Procedure: PERCUTANEOUS CORONARY STENT INTERVENTION (PCI-S);  Surgeon: Sanda Klein, MD;  Location: [facility] CATH LAB;  Service: Cardiovascular;  Laterality: Right;  . Umbilical hernia repair  2006  . Hernia repair    . Basal cell carcinoma excision Left 2015    ankle  . Esophagogastroduodenoscopy  01/2014    gastric ulcer, erosive gastroduodenitis, H Pylori negative.   . Esophagogastroduodenoscopy (egd) with propofol N/A 11/14/2014    Procedure: ESOPHAGOGASTRODUODENOSCOPY (EGD) WITH PROPOFOL;  Surgeon: Milus Banister, MD;  Location: [place];  Service: Endoscopy;  Laterality: N/A;  . Intramedullary (im) nail intertrochanteric Right 02/07/2015    Procedure: INTRAMEDULLARY (IM) NAIL INTERTROCHANTRIC RIGHT HIP;  Surgeon: Renette Butters, MD;  Location: [place];  Service: Orthopedics;  Laterality: Right;    Social History   Social History  . Marital Status:  Married    Spouse Name: N/A  . Number of Children: 5  . Years of Education: N/A   Occupational History  .      works at the [employer] [place] Topics  . Smoking status: Former Smoker -- 2.00 packs/day for 4 years    Types: Cigarettes    Quit date: 01/30/1969  . Smokeless tobacco: Never Used     Comment: "quit smoking cigarettes  in the 1970's"  . Alcohol Use: 0.0 oz/week    0 Standard drinks or equivalent per week     Comment: 11/11/2014 "1-2 times per year"  . Drug Use: Yes    Special: Marijuana     Comment: "smoked some pot in college"  . Sexual Activity: Not Currently   Other Topics Concern  . 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      Current Outpatient Prescriptions on File Prior to Visit  Medication Sig Dispense Refill  . acetaminophen (TYLENOL) 325 MG tablet Take 1 tablet (325 mg total) by mouth every 6 (six) hours as needed for mild pain.    Marland Kitchen ALPRAZolam (XANAX) 0.25 MG tablet Take 0.5 tablets (0.125 mg total) by mouth every 8 (eight) hours as needed for anxiety. 45 tablet 5  . colchicine 0.6 MG tablet Take 0.6 mg by mouth as needed (for gout).   0  . ENSURE (ENSURE) Take 237 mLs by mouth daily.    . fludrocortisone (FLORINEF) 0.1 MG tablet Take 1 tablet (0.1 mg total) by mouth 2 (two) times daily. 60 tablet 2  . fluticasone (FLONASE) 50 MCG/ACT nasal spray Place 2 sprays into both nostrils daily as needed for allergies or rhinitis.    Marland Kitchen HYDROcodone-acetaminophen (NORCO) 5-325 MG tablet Take 1-2 tablets by mouth every 6 (six) hours as needed for moderate pain. 90 tablet 0  . hydrocortisone (ANUSOL-HC) 25 MG suppository Place 25 mg rectally as needed for hemorrhoids or itching.    . meclizine (ANTIVERT) 25 MG tablet Take 25 mg by mouth 3 (three) times daily as needed for  dizziness.    . Melatonin 10 MG CAPS Take 1 capsule by mouth at bedtime as needed (sleep).     . midodrine (PROAMATINE) 2.5 MG tablet Take 1 tablet (2.5 mg total) by mouth 3 (three) times daily with meals. 90 tablet 0  . nitroGLYCERIN (NITROSTAT) 0.4 MG SL tablet Place 0.4 mg under the tongue every 5 (five) minutes as needed. For chest pain    . OVER THE COUNTER MEDICATION Inhale 1 application into  the lungs at bedtime. C-PAP    . pantoprazole (PROTONIX) 40 MG tablet Take 1 tablet (40 mg total) by mouth 2 (two) times daily. KEEP OV. 60 tablet 0  . potassium chloride (K-DUR) 10 MEQ tablet TAKE 4 TABLETS (40 MEQ TOTAL) BY MOUTH DAILY. 60 tablet 2  . promethazine (PHENERGAN) 12.5 MG tablet Take 1-2 tablets (12.5-25 mg total) by mouth every 6 (six) hours as needed for nausea or vomiting. 30 tablet 0  . rivaroxaban (XARELTO) 20 MG TABS tablet Take 1 tablet (20 mg total) by mouth daily with supper. 30 tablet 11  . rosuvastatin (CRESTOR) 40 MG tablet Take 40 mg by mouth at bedtime.    . Sennosides (SENOKOT PO) Take 2 tablets by mouth 2 (two) times daily.     . sertraline (ZOLOFT) 50 MG tablet Take 1 tablet (50 mg total) by mouth daily. 30 tablet 11  . sodium chloride 1 G tablet Take 1 tablet (1 g total) by mouth 2 (two) times daily with a meal. 60 tablet 2   No current facility-administered medications on file prior to visit.    Allergies  Allergen Reactions  . Diphenhydramine Hcl Anaphylaxis    Family History  Problem Relation Age of Onset  . Diabetes type II Mother   . Coronary artery disease    . Colon cancer Neg Hx   . CAD Father 39    CABG, PCI, died at 73  . Heart attack Father 75  . Hypertension Brother   . Diabetes Brother   . Hyperlipidemia Brother   . Brain cancer Maternal Grandmother   . COPD Paternal Grandfather   . Other      denies family history of DVT/PE  . Stroke Neg Hx   . Other Neg Hx     adrenal dz    BP 122/80 mmHg  Pulse 78  Temp(Src) 98 F (36.7 C) (Oral)   Ht 6\' 1"  (1.854 m)  SpO2 97%  Review of Systems Denies LOC    Objective:   Physical Exam VITAL SIGNS:  See vs page GENERAL: no distress.  In wheelchair Ext: trace bilat leg edema.    acth stimulation test is done: baseline cortisol level=6 then cosyntropin 250 mcg is given im 45 minutes later, cortisol level=13 (subnormal response)     Assessment & Plan:  Hypotension, unlikely adrenal-related.  He is making good progress on tapering prednisone.   Patient is advised the following: Patient Instructions  blood tests are requested for you today.  We'll let you know about the results.   Then we'll decide when you would need to come back.     Addendum: reduce prednisone to 1 mg qd.

## 2015-02-28 NOTE — Patient Instructions (Addendum)
blood tests are requested for you today.  We'll let you know about the results.   Then we'll decide when you would need to come back.

## 2015-03-07 ENCOUNTER — Other Ambulatory Visit: Payer: Self-pay | Admitting: Cardiovascular Disease

## 2015-03-07 NOTE — Telephone Encounter (Signed)
REFILL 

## 2015-03-14 ENCOUNTER — Ambulatory Visit (INDEPENDENT_AMBULATORY_CARE_PROVIDER_SITE_OTHER): Payer: Medicare Other | Admitting: *Deleted

## 2015-03-14 DIAGNOSIS — I63411 Cerebral infarction due to embolism of right middle cerebral artery: Secondary | ICD-10-CM

## 2015-03-14 NOTE — Progress Notes (Signed)
Carelink Summary Report / Loop Recorder 

## 2015-03-21 ENCOUNTER — Telehealth: Payer: Self-pay

## 2015-03-21 DIAGNOSIS — E876 Hypokalemia: Secondary | ICD-10-CM

## 2015-03-21 MED ORDER — POTASSIUM CHLORIDE ER 10 MEQ PO TBCR: EXTENDED_RELEASE_TABLET | ORAL | Status: DC

## 2015-03-21 NOTE — Telephone Encounter (Signed)
I would keep going with K replacement.  I had him listed as 4 a day.   rx sent.   We can recheck K at next OV, BMET ordered.   Thanks.

## 2015-03-21 NOTE — Telephone Encounter (Signed)
Mrs Krone left v/m that pt has 2 days of Potassium left and pts wife wants to know if pt needs to continue taking K or should he have more lab test to ck K level. Mrs Piazza request cb. [facility].

## 2015-03-21 NOTE — Telephone Encounter (Signed)
Wife advised.  Follow up appt scheduled.

## 2015-03-26 LAB — CUP PACEART REMOTE DEVICE CHECK: Date Time Interrogation Session: 20161126000736

## 2015-03-27 ENCOUNTER — Other Ambulatory Visit: Payer: Self-pay | Admitting: Endocrinology

## 2015-03-27 ENCOUNTER — Telehealth: Payer: Self-pay

## 2015-03-27 MED ORDER — MIDODRINE HCL 2.5 MG PO TABS: 2.5000 mg | ORAL_TABLET | Freq: Three times a day (TID) | ORAL | Status: DC

## 2015-03-27 NOTE — Telephone Encounter (Signed)
Rx refilled for midodrine.

## 2015-04-05 ENCOUNTER — Other Ambulatory Visit: Payer: Self-pay | Admitting: Cardiovascular Disease

## 2015-04-05 NOTE — Telephone Encounter (Signed)
°*  STAT* If patient is at the pharmacy, call can be transferred to refill team.   1. Which medications need to be refilled? (please list name of each medication and dose if known) Generic Protonix-pt has an appointment on 05-22-15 with Dr Claiborne Billings  2. Which pharmacy/location (including street and city if local pharmacy) is medication to be sent to?[place] [phone]  3. Do they need a 30 day or 90 day supply? 30 and refills

## 2015-04-05 NOTE — Telephone Encounter (Signed)
Rx(s) sent to pharmacy electronically.  

## 2015-04-11 ENCOUNTER — Ambulatory Visit (INDEPENDENT_AMBULATORY_CARE_PROVIDER_SITE_OTHER): Payer: Medicare Other | Admitting: *Deleted

## 2015-04-11 DIAGNOSIS — I63411 Cerebral infarction due to embolism of right middle cerebral artery: Secondary | ICD-10-CM

## 2015-04-12 NOTE — Progress Notes (Signed)
Carelink Summary Report / Loop Recorder 

## 2015-04-22 LAB — CUP PACEART REMOTE DEVICE CHECK: Date Time Interrogation Session: 20161226000631

## 2015-04-28 ENCOUNTER — Telehealth: Payer: Self-pay

## 2015-04-28 ENCOUNTER — Telehealth: Payer: Self-pay | Admitting: Endocrinology

## 2015-04-28 MED ORDER — PREDNISONE 1 MG PO TABS: 1.0000 mg | ORAL_TABLET | Freq: Every day | ORAL | Status: DC

## 2015-04-28 MED ORDER — FLUDROCORTISONE ACETATE 0.1 MG PO TABS: 0.1000 mg | ORAL_TABLET | Freq: Two times a day (BID) | ORAL | Status: DC

## 2015-04-28 NOTE — Telephone Encounter (Signed)
Please refill x 1 pending ov

## 2015-04-28 NOTE — Telephone Encounter (Signed)
Pt is due for a refill on both medications. Ok to refill for a 30 day supply?

## 2015-04-28 NOTE — Telephone Encounter (Signed)
Pt wife called she said she did not remember what the decision was as to whether or not Pt needs to come back to see Dr. Loanne Drilling.  She would like to speak with someone.

## 2015-04-28 NOTE — Telephone Encounter (Signed)
Left a voicemail advising of note below. Requested the pt to call back and reschedule his appointment.

## 2015-04-28 NOTE — Telephone Encounter (Signed)
Yes, please continue for now.

## 2015-04-28 NOTE — Telephone Encounter (Signed)
Pt's wife called and wanted to verify. Should the pt continue to take the prednisone and florinef. Pt's wife states she is confused based of the recent office visit on why the pt would be taken of the prednisone. Please advise if the pt would stay off the medication and why. Thanks!

## 2015-04-28 NOTE — Telephone Encounter (Signed)
See note be and please advise if pt needs a follow up?

## 2015-04-28 NOTE — Telephone Encounter (Signed)
Yes, please come back soon, as i hope you stop taking the prednisone

## 2015-04-28 NOTE — Telephone Encounter (Signed)
Left a voicemail advising the pt's wife of note below. Requested a call back to schedule office visit.

## 2015-05-09 ENCOUNTER — Other Ambulatory Visit: Payer: Self-pay | Admitting: Endocrinology

## 2015-05-10 MED ORDER — MIDODRINE HCL 2.5 MG PO TABS: 2.5000 mg | ORAL_TABLET | Freq: Three times a day (TID) | ORAL | Status: DC

## 2015-05-11 ENCOUNTER — Ambulatory Visit (INDEPENDENT_AMBULATORY_CARE_PROVIDER_SITE_OTHER): Payer: Medicare Other | Admitting: *Deleted

## 2015-05-11 DIAGNOSIS — I63411 Cerebral infarction due to embolism of right middle cerebral artery: Secondary | ICD-10-CM

## 2015-05-12 NOTE — Progress Notes (Signed)
Carelink Summary Report / Loop Recorder 

## 2015-05-15 ENCOUNTER — Ambulatory Visit (INDEPENDENT_AMBULATORY_CARE_PROVIDER_SITE_OTHER): Payer: Medicare Other | Admitting: Neurology

## 2015-05-15 ENCOUNTER — Encounter: Payer: Self-pay | Admitting: Neurology

## 2015-05-15 VITALS — BP 126/78 | HR 68 | Resp 20 | Ht 73.0 in | Wt 215.0 lb

## 2015-05-15 DIAGNOSIS — G8192 Hemiplegia, unspecified affecting left dominant side: Secondary | ICD-10-CM

## 2015-05-15 DIAGNOSIS — R269 Unspecified abnormalities of gait and mobility: Secondary | ICD-10-CM

## 2015-05-15 DIAGNOSIS — M5481 Occipital neuralgia: Secondary | ICD-10-CM | POA: Diagnosis not present

## 2015-05-15 DIAGNOSIS — I63411 Cerebral infarction due to embolism of right middle cerebral artery: Secondary | ICD-10-CM

## 2015-05-15 DIAGNOSIS — M25512 Pain in left shoulder: Secondary | ICD-10-CM

## 2015-05-15 DIAGNOSIS — I255 Ischemic cardiomyopathy: Secondary | ICD-10-CM

## 2015-05-15 DIAGNOSIS — G4733 Obstructive sleep apnea (adult) (pediatric): Secondary | ICD-10-CM | POA: Diagnosis not present

## 2015-05-15 MED ORDER — CLOPIDOGREL BISULFATE 75 MG PO TABS: 75.0000 mg | ORAL_TABLET | Freq: Every day | ORAL | Status: DC

## 2015-05-15 MED ORDER — HYDROCODONE-ACETAMINOPHEN 5-325 MG PO TABS: 1.0000 | ORAL_TABLET | Freq: Four times a day (QID) | ORAL | Status: DC | PRN

## 2015-05-15 MED ORDER — BACLOFEN 10 MG PO TABS: ORAL_TABLET | ORAL | Status: DC

## 2015-05-15 NOTE — Progress Notes (Signed)
[facility]  PATIENT: Gregory Wiley DOB: 1950-06-21  REFERRING CLINICIAN: james Little HISTORY FROM: patient REASON FOR VISIT: stroke   HISTORICAL  CHIEF COMPLAINT:  Chief Complaint  Patient presents with  . History of CVA    Denies further stroke sx. Sts. he only occasionally uses CPAP.  He has a tooth that needs to be pulled, and his dentist wants [facility] to fax clearance for procedure/to d/c Xarelto, to fax # [phone], attn: Dr. Ronnald Ramp.  Sts. he is haivng more cramps in his legs upon waking in the morning./fim  . Left Hemiplegia  . 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     Stroke:   He denies any new stroke symptoms.    He is back on Plavix (was on in past but changed to Xarelto after PE last year).   He is going to have dental work soon.     Stroke History:  On 02/12/2014, he woke up with weakness on the left side and was unable to stand up. He went to the emergency room.Marland Kitchen MRI of the brain showed an acute right MCA territory stroke that mostly involved the right temporal lobe with extension to the insula and the basal ganglia on the right. MR angiogram showed an occlusion of the right M1 segment  Of note, he had been on aspirin and Plavix that had been discontinued a few weeks earlier due to a bleeding ulcer.   Ultrasound of the carotid arteries showed less than 39% stenosis. A transesophageal echocardiogram showed a LVEF equals 45%, there was septal hypokinesia, no PFO or ASD was identified.  Cardioembolism was suspected.  He was initially placed on Plavix but that was subsequently changed to Xarelto after he was found to have pulmonary embolisms while at rehabilitation center.  Gait/Strength/sensation:    He has weakness on the left side. He uses a quad cane and can go about 50-60 feet without stopping.   His leg is weak and he cannot raise the knee any but can straighten the  lower leg out mildly (unchanged).  He has clonus and notes a lot of spasticity, sometimes painful.  This occurs more at night.     He can move left shoulder backwards to his arm.  He also has some numbness on the left side with some pain. He had some neglect and numbness initially that improved.  Headache:   The occipital nerve blocks in the past greatly helped to reduce the headache and neck pain.   Swallow/weight:   He has dysphagia that may be slightly worse then last visit.   He sometimes chokes on food, especially if eating more.    He has lost > 100 pounds since the stroke.    Bladder:  He has not had bladder issues from the stroke.  He notes some urinary hesitancy but has not had incontinence.  OSA/sleep/sleepiness:    He has obstructive sleep apnea but takes mask off in middle of night so only uses about 1/2 the nights, often < 4 hours..   He snores still despite weight loss.    He has excessive daytime sleepiness and is on methylphenidate 10 mg by mouth twice a day with mild much benefit.  EDS is better if he uses CPAP   Left shoulder pain:   He reports left shoulder pain that increases when his arm is moved or when he lays on  his left side. In the past, he has had bursitis and injections have helped reduce the pain a fair amount.   Cognitive:   He and his wife note some decreased attention and decrease executive function abilities.  However, neglect has improved.    REVIEW OF SYSTEMS:  Constitutional: No fevers, chills, sweats, or change in appetite.  He sleeps poorly.    He is very tired Eyes: No visual changes, double vision, eye pain Ear, nose and throat: No hearing loss, ear pain, nasal congestion, sore throat Cardiovascular: No chest pain, palpitations Respiratory:  No shortness of breath at rest or with exertion.   No wheezes GastrointestinaI: No nausea, vomiting, diarrhea, abdominal pain, fecal incontinence Genitourinary:  No dysuria, urinary retention or frequency.  No  nocturia. Musculoskeletal:  No neck pain, back pain but he has shoulder and hip pain Integumentary: No rash, pruritus, skin lesions Neurological: as above Psychiatric: No depression at this time.  No anxiety Endocrine: No palpitations, diaphoresis, change in appetite, change in weigh.  He notes feeling cold and increased thirst Hematologic/Lymphatic:  No anemia, purpura, petechiae. Allergic/Immunologic: No itchy/runny eyes, nasal congestion, recent allergic reactions, rashes  ALLERGIES: Allergies  Allergen Reactions  . Diphenhydramine Hcl Anaphylaxis    HOME MEDICATIONS: Outpatient Prescriptions Prior to Visit  Medication Sig Dispense Refill  . acetaminophen (TYLENOL) 325 MG tablet Take 1 tablet (325 mg total) by mouth every 6 (six) hours as needed for mild pain.    Marland Kitchen ALPRAZolam (XANAX) 0.25 MG tablet Take 0.5 tablets (0.125 mg total) by mouth every 8 (eight) hours as needed for anxiety. 45 tablet 5  . fludrocortisone (FLORINEF) 0.1 MG tablet Take 1 tablet (0.1 mg total) by mouth 2 (two) times daily. 60 tablet 0  . fluticasone (FLONASE) 50 MCG/ACT nasal spray Place 2 sprays into both nostrils daily as needed for allergies or rhinitis.    Marland Kitchen HYDROcodone-acetaminophen (NORCO) 5-325 MG tablet Take 1-2 tablets by mouth every 6 (six) hours as needed for moderate pain. 90 tablet 0  . hydrocortisone (ANUSOL-HC) 25 MG suppository Place 25 mg rectally as needed for hemorrhoids or itching.    . Melatonin 10 MG CAPS Take 1 capsule by mouth at bedtime as needed (sleep).     . midodrine (PROAMATINE) 2.5 MG tablet Take 1 tablet (2.5 mg total) by mouth 3 (three) times daily with meals. 90 tablet 2  . nitroGLYCERIN (NITROSTAT) 0.4 MG SL tablet Place 0.4 mg under the tongue every 5 (five) minutes as needed. For chest pain    . OVER THE COUNTER MEDICATION Inhale 1 application into the lungs at bedtime. C-PAP    . pantoprazole (PROTONIX) 40 MG tablet Take 1 tablet (40 mg total) by mouth 2 (two) times daily.  60 tablet 8  . potassium chloride (K-DUR) 10 MEQ tablet TAKE 4 TABLETS (40 MEQ TOTAL) BY MOUTH DAILY. 120 tablet 2  . predniSONE (DELTASONE) 1 MG tablet Take 1 tablet (1 mg total) by mouth daily with breakfast. 30 tablet 0  . rivaroxaban (XARELTO) 20 MG TABS tablet Take 1 tablet (20 mg total) by mouth daily with supper. 30 tablet 11  . rosuvastatin (CRESTOR) 40 MG tablet Take 40 mg by mouth at bedtime.    . Sennosides (SENOKOT PO) Take 2 tablets by mouth 2 (two) times daily.     . sertraline (ZOLOFT) 50 MG tablet Take 1 tablet (50 mg total) by mouth daily. 30 tablet 11  . colchicine 0.6 MG tablet Take 0.6 mg by mouth as needed (  for gout). Reported on 05/15/2015  0  . ENSURE (ENSURE) Take 237 mLs by mouth daily. Reported on 05/15/2015    . meclizine (ANTIVERT) 25 MG tablet Take 25 mg by mouth 3 (three) times daily as needed for dizziness. Reported on 05/15/2015    . promethazine (PHENERGAN) 12.5 MG tablet Take 1-2 tablets (12.5-25 mg total) by mouth every 6 (six) hours as needed for nausea or vomiting. (Patient not taking: Reported on 05/15/2015) 30 tablet 0  . sodium chloride 1 G tablet Take 1 tablet (1 g total) by mouth 2 (two) times daily with a meal. (Patient not taking: Reported on 05/15/2015) 60 tablet 2   No facility-administered medications prior to visit.    PAST MEDICAL HISTORY: Past Medical History  Diagnosis Date  . Coronary artery disease     stent, then CABG 07/20/10  . Hypertension   . Hyperlipidemia   . Obesity (BMI 30-39.9)   . H/O cardiomyopathy     ischemic, now last echo 07/30/11, EF 38%W  . Diverticulosis   . Internal hemorrhoids   . Tubular adenoma of colon   . Plantar fasciitis of left foot   . Basal cell carcinoma of skin of left ankle   . NSTEMI (non-ST elevated myocardial infarction) ([place])     NSTEMI- last cath 06/2011-Stent to LCX-DES, last nuc 07/30/11 low risk  . MI (myocardial infarction) ([place])     "I had 4 before I retired in 2006" (11/11/2014)  . Pulmonary  embolism ([place]) 03/2014  . GERD (gastroesophageal reflux disease)   . Bleeding stomach ulcer 2015  . Daily headache     "for the past month" (11/11/2014)  . Stroke ([place]) 01/2014    "no control of LUE, can slightly LLE; memory problems since" (11/11/2014)  . Arthritis     "back" (11/11/2014)  . Gout   . Depression   . Pneumonia     hosp. 12/2014  . Anxiety   . History of hiatal hernia   . 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 OSA on CPAP     since July 2013- uses CPAP sometimes , states he has lost 147 lbs. since stroke & doesn't use the CPAP as much as he use to.   . Adrenal insufficiency ([place])     PAST SURGICAL HISTORY: Past Surgical History  Procedure Laterality Date  . Coronary artery bypass graft  07/20/2010     LIMA-LAD; VG-ACUTE MARG of RCA; Seq VG-distal RCA & then pda  . Coronary angioplasty with stent placement  07/01/2011    DES-Resolute to native LCX  . Coronary angioplasty with stent placement  12/01/2007    COMPLEX 5 LESION PCI INCLUDING CUTTING BALLOON AND 4 CYPHER DESs  . Knee arthroscopy Right   . Hand surgery Right     to take glass out  . Colonoscopy N/A 02/10/2013    Procedure: COLONOSCOPY;  Surgeon: Ladene Artist, MD;  Location: [facility] ENDOSCOPY;  Service: Endoscopy;  Laterality: N/A;  . Retinal detachment surgery Right   . Loop recorder implant  02/15/2014    MDT LINQ implanted by Dr Rayann Heman for cryptogenic stroke  . Tee without cardioversion N/A 02/15/2014    Procedure: TRANSESOPHAGEAL ECHOCARDIOGRAM (TEE);  Surgeon: Josue Hector, MD;  Location: [facility] ENDOSCOPY;  Service: Cardiovascular;  Laterality: N/A;  . Left heart catheterization with coronary/graft angiogram  07/01/2011    Procedure: LEFT HEART CATHETERIZATION WITH Beatrix Fetters;  Surgeon: Sanda Klein, MD;  Location:  [facility] CATH LAB;  Service: Cardiovascular;;  . Percutaneous coronary stent intervention (pci-s) Right 07/01/2011    Procedure: PERCUTANEOUS CORONARY STENT  INTERVENTION (PCI-S);  Surgeon: Sanda Klein, MD;  Location: [facility] CATH LAB;  Service: Cardiovascular;  Laterality: Right;  . Umbilical hernia repair  2006  . Hernia repair    . Basal cell carcinoma excision Left 2015    ankle  . Esophagogastroduodenoscopy  01/2014    gastric ulcer, erosive gastroduodenitis, H Pylori negative.   . Esophagogastroduodenoscopy (egd) with propofol N/A 11/14/2014    Procedure: ESOPHAGOGASTRODUODENOSCOPY (EGD) WITH PROPOFOL;  Surgeon: Milus Banister, MD;  Location: [place];  Service: Endoscopy;  Laterality: N/A;  . Intramedullary (im) nail intertrochanteric Right 02/07/2015    Procedure: INTRAMEDULLARY (IM) NAIL INTERTROCHANTRIC RIGHT HIP;  Surgeon: Renette Butters, MD;  Location: [facility];  Service: Orthopedics;  Laterality: Right;    FAMILY HISTORY: Family History  Problem Relation Age of Onset  . Diabetes type II Mother   . Coronary artery disease    . Colon cancer Neg Hx   . CAD Father 35    CABG, PCI, died at 87  . Heart attack Father 45  . Hypertension Brother   . Diabetes Brother   . Hyperlipidemia Brother   . Brain cancer Maternal Grandmother   . COPD Paternal Grandfather   . Other      denies family history of DVT/PE  . Stroke Neg Hx   . Other Neg Hx     adrenal dz    SOCIAL HISTORY:  Social History   Social History  . Marital Status: Married    Spouse Name: N/A  . Number of Children: 5  . Years of Education: N/A   Occupational History  .      works at the [employer] Topics  . Smoking status: Former Smoker -- 2.00 packs/day for 4 years    Types: Cigarettes    Quit date: 01/30/1969  . Smokeless tobacco: Never Used     Comment: "quit smoking cigarettes  in the 1970's"  . Alcohol Use: 0.0 oz/week    0 Standard drinks or equivalent per week     Comment: 11/11/2014 "1-2 times per year"  . Drug Use: Yes    Special: Marijuana     Comment: "smoked some pot in college"  . Sexual Activity: Not Currently     Other Topics Concern  . 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       PHYSICAL EXAM  Filed Vitals:   05/15/15 1319  BP: 126/78  Pulse: 68  Resp: 20  Height: 6\' 1"  (1.854 m)  Weight: 215 lb (97.523 kg)    Body mass index is 28.37 kg/(m^2).   General: The patient is well-developed and well-nourished and in no acute distress.  He is in a wheelchair.  Neck: The neck is supple, no carotid bruits are noted.  The neck is mildy tender   Skin: He has mild edema at the left ankle, compared to the right.    Neurologic Exam  Mental status: The patient is alert and oriented x 3 at the time of the examination. The patient has apparent normal recent and remote memory. Attention is mildly reduced.   Speech is normal.  Cranial nerves: Extraocular movements are full. Saccadic pursuit is reduced to the left. Pupils are equal, round, and reactive to light and accomodation.     He no longer has a facial droop. There is good facial sensation to soft touch bilaterally.  Trapezius and sternocleidomastoid strength is reduced on the left.. No dysarthria is noted.  No obvious hearing deficits are noted.  Motor:  Muscle bulk is normal. He has increased tone in the left arm and leg.  Arm is held in a flexed position. This mostly involved the flexor muscles of the arm and flexors and extensors of the leg. Strength is 5/5 on the right. Strength is 2/5 in left shoulder elevation and 1/5 in proximal arm strength. Strength is 2+/5 in the quadriceps and 1/5 in other proximal muscles of the left leg  Sensory: Sensory testing is intact to pinprick, soft touch, vibration sensation, and position sense on all 4 extremities. He did not have neglect or extinction at this time.  Coordination: Cerebellar testing reveals good finger-nose-finger  and heel-to-shin on the right and he cannot do these tasks on the left..  Gait and station: Station requires unilat support. He is able to walk in room with cane. Cannot tandem.  Reflexes: Deep tendon reflexes are increased on the left.Marland Kitchen      DIAGNOSTIC DATA (LABS, IMAGING, TESTING) - I reviewed patient records, labs, notes, testing and imaging myself where available.  Lab Results  Component Value Date   WBC 7.8 01/31/2015   HGB 13.0 01/31/2015   HCT 40.9 01/31/2015   MCV 91.5 01/31/2015   PLT 193 01/31/2015      Component Value Date/Time   NA 142 01/31/2015 1411   K 3.8 01/31/2015 1411   CL 110 01/31/2015 1411   CO2 28 01/31/2015 1411   GLUCOSE 111* 01/31/2015 1411   BUN 9 01/31/2015 1411   CREATININE 0.97 01/31/2015 1411   CREATININE 0.83 09/13/2014 1032   CALCIUM 8.5* 01/31/2015 1411   PROT 4.5* 12/27/2014 0611   ALBUMIN 1.8* 12/27/2014 0611   AST 39 12/27/2014 0611   ALT 36 12/27/2014 0611   ALKPHOS 51 12/27/2014 0611   BILITOT 0.4 12/27/2014 0611   GFRNONAA >60 01/31/2015 1411   GFRNONAA >89 09/13/2014 1032   GFRAA >60 01/31/2015 1411   GFRAA >89 09/13/2014 1032   Lab Results  Component Value Date   CHOL 113 07/22/2014   HDL 38* 07/22/2014   LDLCALC 54 07/22/2014   TRIG 104 07/22/2014   CHOLHDL 3.0 07/22/2014   Lab Results  Component Value Date   HGBA1C 5.6 02/13/2014   Lab Results  Component Value Date   VITAMINB12 559 12/22/2014   Lab Results  Component Value Date   TSH 1.035 12/23/2014        ASSESSMENT AND PLAN  No diagnosis found. 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   Renew hydrocodone 2.   We discuss making sure at he walks around the [place]oue safely. I recommend that he use a walker more. 3.   He may go off Plavix for a  scheduled dental procedure (x 5 days and go back on the next day) 4.   He is advised to use his CPAP on a regular basis for his obstructive sleep apnea. 5.    He will return to see  me in 5 months or sooner if he has new or worsening neurologic symptoms.    Gregory Wiley A. Felecia Shelling, MD, PhD [id number], [id number] PM Certified in Neurology, Clinical Neurophysiology, Sleep Medicine, Pain Medicine and Neuroimaging  [facility] [address] [place], [place] [address] [phone]

## 2015-05-19 ENCOUNTER — Ambulatory Visit (INDEPENDENT_AMBULATORY_CARE_PROVIDER_SITE_OTHER): Payer: Medicare Other | Admitting: Family Medicine

## 2015-05-19 ENCOUNTER — Encounter: Payer: Self-pay | Admitting: Family Medicine

## 2015-05-19 VITALS — BP 122/80 | HR 67 | Temp 97.5°F | Wt 214.5 lb

## 2015-05-19 DIAGNOSIS — R067 Sneezing: Secondary | ICD-10-CM | POA: Diagnosis not present

## 2015-05-19 DIAGNOSIS — I63411 Cerebral infarction due to embolism of right middle cerebral artery: Secondary | ICD-10-CM

## 2015-05-19 NOTE — Patient Instructions (Signed)
Recheck summertime.   Ask Loanne Drilling if he is okay with you restarting the flonase.  Take care.  Glad to see you.

## 2015-05-19 NOTE — Progress Notes (Signed)
Pre visit review using our clinic review tool, if applicable. No additional management support is needed unless otherwise documented below in the visit note.  He has dental work planned for later this month with the plan to stop xarelto for 5 days prior.  Currently on amoxil and not having tooth pain currently.  D/w pt.  No bleeding.    Still with good ROM R hip after pinning w/o pain.  Still using his bracing for L leg, and walker, if not in w/c . No new/improved strength on the L arm and leg.    Needed refill on NTG, not because of use but to have on hand with old rx expiring.  D/w pt.  Done.   He has had mult episodes of sneezing and throat sx.  D/w pt.  Hasn't been using flonase recently.  No fevers.   Meds, vitals, and allergies reviewed.   ROS: See HPI.  Otherwise, noncontributory.  nad ncat Nasal exam slightly stuffy, OP wnl, MMM Neck supple, no LA rrr ctab abd soft Ext w/o edema L arm and L weak, at baseline.

## 2015-05-21 DIAGNOSIS — R067 Sneezing: Secondary | ICD-10-CM | POA: Insufficient documentation

## 2015-05-21 MED ORDER — NITROGLYCERIN 0.4 MG SL SUBL: 0.4000 mg | SUBLINGUAL_TABLET | SUBLINGUAL | Status: DC | PRN

## 2015-05-21 NOTE — Assessment & Plan Note (Signed)
Okay to try flonase if okay with endo- with steroid taper pending.  D/w pt.

## 2015-05-21 NOTE — Assessment & Plan Note (Signed)
Continue current measures, anticoagulation, except for the time required off med for the dental work.  D/w pt.  He agrees.

## 2015-05-22 ENCOUNTER — Encounter: Payer: Self-pay | Admitting: Cardiovascular Disease

## 2015-05-22 ENCOUNTER — Ambulatory Visit (INDEPENDENT_AMBULATORY_CARE_PROVIDER_SITE_OTHER): Payer: Medicare Other | Admitting: Cardiovascular Disease

## 2015-05-22 VITALS — BP 138/70 | HR 61 | Ht 73.0 in | Wt 216.0 lb

## 2015-05-22 DIAGNOSIS — I255 Ischemic cardiomyopathy: Secondary | ICD-10-CM | POA: Diagnosis not present

## 2015-05-22 DIAGNOSIS — G4733 Obstructive sleep apnea (adult) (pediatric): Secondary | ICD-10-CM | POA: Diagnosis not present

## 2015-05-22 DIAGNOSIS — I25810 Atherosclerosis of coronary artery bypass graft(s) without angina pectoris: Secondary | ICD-10-CM | POA: Diagnosis not present

## 2015-05-22 DIAGNOSIS — E785 Hyperlipidemia, unspecified: Secondary | ICD-10-CM

## 2015-05-22 DIAGNOSIS — Z7901 Long term (current) use of anticoagulants: Secondary | ICD-10-CM

## 2015-05-22 LAB — CUP PACEART REMOTE DEVICE CHECK: Date Time Interrogation Session: 20170125000542

## 2015-05-22 NOTE — Patient Instructions (Signed)
Your physician wants you to follow-up in: 4-6 months with Dr Claiborne Billings. You will receive a reminder letter in the mail two months in advance. If you don't receive a letter, please call our office to schedule the follow-up appointment.

## 2015-05-22 NOTE — Progress Notes (Signed)
Carelink summary report received. Battery status OK. Normal device function. No new symptom episodes, tachy episodes, brady, or pause episodes. No new AF episodes. Monthly summary reports and ROV/PRN 

## 2015-05-22 NOTE — Progress Notes (Signed)
Patient ID: MACAI SISNEROS, male   DOB: 1950-11-23, 65 y.o.   MRN: [id number]    Primary MD: Dr. [name]  HPI: Gregory Wiley is a 65 y.o. male presents to the office today for 6 month follow-up cardiology evaluation. He is a former patient of Dr. Rex Kras.  Mr. Aldaco has established CAD and underwent stenting of his RCA in May 2011. He is s/p CABG surgery with LIMA to the LAD, sequential vein to the distal RCA and PDA, and vein to the acute marginal branch of his right coronary artery. In April 2013 he developed unstable angina and ruled in for non-ST segment elevation myocardial infarction. Catheterization revealed 99% stenosis of the large left circumflex vessel and he underwent successful stenting with insertion of a 4.0x18 mm resolute DES stent post dilated to 4.5 mm.  Additional problems include obstructive sleep apnea. On his initial polysomnogram in 2010 AHI was 11.3 overall, but during REM sleep he had moderate sleep apnea with an AHI of 24.8. He never did initiate CPAP therapy at that time. However, since his myocardial infarction he has been using CPAP therapy since July 2013.   Presently, he states that he is only been using this intermittently because he is having some difficulty with the tubing.  He has a nasal pillow mask.  At times, the tubing on the side gets caught in his hospital bed at home.  He has a history of hyperlipidemia, hypertension, and morbid obesity. An echo Doppler study in May 2013 suggested an ejection fraction of 38% and on nuclear imaging  was 37% with scar in the LAD territory.  He was admitted to [facility] hospital in November 2015 with a large right MCA territory CVA.  TEE was negative for embolic source.  EF was 45% on echocardiogram.  He underwent implantable loop recorder by Dr. Rayann Heman. He was readmitted with bilateral submassive pulmonary embolism.  Venous Dopplers were positive for DVT.  Ejection fraction was 25-30%, which is felt to be contributed by strain from  his pulmonary emboli.  He had mild troponin elevation which was felt to be due to demand ischemia and has been on Xarelto for anticoagulation.  He was admitted on the [facility] hospitalist service on 11/11/2014 with dehydration.  Several of his medications were held.  I reviewed his hospital record.  He had a history of nausea and vomiting that had been going on for approximatey a month.  He has continued to feel weak and in his words, "feels terrible" since his hospitalization.  He has had issues with headaches which he did see neurology.  He has not been eating well.  He seeing Dr. Damita Dunnings at [facility] for primary care.  He denies chest pressure.  He denies fevers.  He is on Xarelto anticoagulation and denies overt bleeding.  There is a remote history of erosive gastroduodenitis, GERD, esophageal stricture for which in the past he had been seen by Dr. Fuller Plan.  Since I last saw him, he has undergone  Orthopedic surgery by Dr. Fredonia Highland with placement of an intramedullary nail in the intertrochanteric  portion of his right hip.  When I last saw him, he was tachycardic which I felt was contributed by his Ritalin.  He has since been off this medication.  I did prescribe carvedilol at low dose but ultimately this had been discontinued.  During subsequent hospitalization.  Presently, he feels his heart rhythm is stable.  It is no longer beating fast.  He denies awareness  of palpitations.  He denies PND, orthopnea.  He tells me he will need to have a tooth pulled and was told by his dentist to hold Xarelto for 5 days prior to the procedure.He presents for evaluation.   Past Medical History  Diagnosis Date  . Coronary artery disease     stent, then CABG 07/20/10  . Hypertension   . Hyperlipidemia   . Obesity (BMI 30-39.9)   . H/O cardiomyopathy     ischemic, now last echo 07/30/11, EF 38%W  . Diverticulosis   . Internal hemorrhoids   . Tubular adenoma of colon   . Plantar fasciitis of left foot   . Basal  cell carcinoma of skin of left ankle   . NSTEMI (non-ST elevated myocardial infarction) ([place])     NSTEMI- last cath 06/2011-Stent to LCX-DES, last nuc 07/30/11 low risk  . MI (myocardial infarction) ([place])     "I had 4 before I retired in 2006" (11/11/2014)  . Pulmonary embolism ([place]) 03/2014  . GERD (gastroesophageal reflux disease)   . Bleeding stomach ulcer 2015  . Daily headache     "for the past month" (11/11/2014)  . Stroke ([place]) 01/2014    "no control of LUE, can slightly LLE; memory problems since" (11/11/2014)  . Arthritis     "back" (11/11/2014)  . Gout   . Depression   . Pneumonia     hosp. 12/2014  . Anxiety   . History of hiatal hernia   . 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 OSA on CPAP     since July 2013- uses CPAP sometimes , states he has lost 147 lbs. since stroke & doesn't use the CPAP as much as he use to.   . Adrenal insufficiency [facility])     Past Surgical History  Procedure Laterality Date  . Coronary artery bypass graft  07/20/2010     LIMA-LAD; VG-ACUTE MARG of RCA; Seq VG-distal RCA & then pda  . Coronary angioplasty with stent placement  07/01/2011    DES-Resolute to native LCX  . Coronary angioplasty with stent placement  12/01/2007    COMPLEX 5 LESION PCI INCLUDING CUTTING BALLOON AND 4 CYPHER DESs  . Knee arthroscopy Right   . Hand surgery Right     to take glass out  . Colonoscopy N/A 02/10/2013    Procedure: COLONOSCOPY;  Surgeon: Ladene Artist, MD;  Location: [facility] ENDOSCOPY;  Service: Endoscopy;  Laterality: N/A;  . Retinal detachment surgery Right   . Loop recorder implant  02/15/2014    MDT LINQ implanted by Dr Rayann Heman for cryptogenic stroke  . Tee without cardioversion N/A 02/15/2014    Procedure: TRANSESOPHAGEAL ECHOCARDIOGRAM (TEE);  Surgeon: Josue Hector, MD;  Location: [facility] ENDOSCOPY;  Service: Cardiovascular;  Laterality: N/A;  . Left heart catheterization with coronary/graft angiogram  07/01/2011    Procedure: LEFT  HEART CATHETERIZATION WITH Beatrix Fetters;  Surgeon: Sanda Klein, MD;  Location: [place] CATH LAB;  Service: Cardiovascular;;  . Percutaneous coronary stent intervention (pci-s) Right 07/01/2011    Procedure: PERCUTANEOUS CORONARY STENT INTERVENTION (PCI-S);  Surgeon: Sanda Klein, MD;  Location: [facility] CATH LAB;  Service: Cardiovascular;  Laterality: Right;  . Umbilical hernia repair  2006  . Hernia repair    . Basal cell carcinoma excision Left 2015    ankle  . Esophagogastroduodenoscopy  01/2014    gastric ulcer, erosive gastroduodenitis, H Pylori negative.   . Esophagogastroduodenoscopy (egd) with propofol N/A  11/14/2014    Procedure: ESOPHAGOGASTRODUODENOSCOPY (EGD) WITH PROPOFOL;  Surgeon: Milus Banister, MD;  Location: [place];  Service: Endoscopy;  Laterality: N/A;  . Intramedullary (im) nail intertrochanteric Right 02/07/2015    Procedure: INTRAMEDULLARY (IM) NAIL INTERTROCHANTRIC RIGHT HIP;  Surgeon: Renette Butters, MD;  Location: [place];  Service: Orthopedics;  Laterality: Right;    Allergies  Allergen Reactions  . Diphenhydramine Hcl Anaphylaxis    Current Outpatient Prescriptions  Medication Sig Dispense Refill  . acetaminophen (TYLENOL) 325 MG tablet Take 1 tablet (325 mg total) by mouth every 6 (six) hours as needed for mild pain.    Marland Kitchen ALPRAZolam (XANAX) 0.25 MG tablet Take 0.5 tablets (0.125 mg total) by mouth every 8 (eight) hours as needed for anxiety. 45 tablet 5  . amoxicillin (AMOXIL) 500 MG tablet Take 500 mg by mouth 4 (four) times daily.    . colchicine 0.6 MG tablet Take 0.6 mg by mouth as needed (for gout). Reported on 05/19/2015  0  . fludrocortisone (FLORINEF) 0.1 MG tablet Take 1 tablet (0.1 mg total) by mouth 2 (two) times daily. 60 tablet 0  . fluticasone (FLONASE) 50 MCG/ACT nasal spray Place 2 sprays into both nostrils daily as needed for allergies or rhinitis.    Marland Kitchen HYDROcodone-acetaminophen (NORCO) 5-325 MG tablet Take 1-2 tablets by mouth every  6 (six) hours as needed for moderate pain. 90 tablet 0  . hydrocortisone (ANUSOL-HC) 25 MG suppository Place 25 mg rectally as needed for hemorrhoids or itching.    . Melatonin 10 MG CAPS Take 1 capsule by mouth at bedtime as needed (sleep).     . midodrine (PROAMATINE) 2.5 MG tablet Take 1 tablet (2.5 mg total) by mouth 3 (three) times daily with meals. 90 tablet 2  . nitroGLYCERIN (NITROSTAT) 0.4 MG SL tablet Place 1 tablet (0.4 mg total) under the tongue every 5 (five) minutes as needed. For chest pain 25 tablet 12  . OVER THE COUNTER MEDICATION Inhale 1 application into the lungs at bedtime. C-PAP    . pantoprazole (PROTONIX) 40 MG tablet Take 1 tablet (40 mg total) by mouth 2 (two) times daily. 60 tablet 8  . potassium chloride (K-DUR) 10 MEQ tablet TAKE 4 TABLETS (40 MEQ TOTAL) BY MOUTH DAILY. 120 tablet 2  . predniSONE (DELTASONE) 1 MG tablet Take 1 tablet (1 mg total) by mouth daily with breakfast. 30 tablet 0  . promethazine (PHENERGAN) 12.5 MG tablet Take 1-2 tablets (12.5-25 mg total) by mouth every 6 (six) hours as needed for nausea or vomiting. 30 tablet 0  . rivaroxaban (XARELTO) 20 MG TABS tablet Take 20 mg by mouth daily with supper.    . rosuvastatin (CRESTOR) 40 MG tablet Take 40 mg by mouth at bedtime.    . Sennosides (SENOKOT PO) Take 2 tablets by mouth 2 (two) times daily.     . sertraline (ZOLOFT) 50 MG tablet Take 1 tablet (50 mg total) by mouth daily. 30 tablet 11   No current facility-administered medications for this visit.    Social History   Social History  . Marital Status: Married    Spouse Name: N/A  . Number of Children: 5  . Years of Education: N/A   Occupational History  .      works at the [employer] Topics  . Smoking status: Former Smoker -- 2.00 packs/day for 4 years    Types: Cigarettes    Quit date: 01/30/1969  . Smokeless  tobacco: Never Used     Comment: "quit smoking cigarettes  in the 1970's"  . Alcohol Use: 0.0  oz/week    0 Standard drinks or equivalent per week     Comment: 11/11/2014 "1-2 times per year"  . Drug Use: Yes    Special: Marijuana     Comment: "smoked some pot in college"  . Sexual Activity: Not Currently   Other Topics Concern  . 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      Family History  Problem Relation Age of Onset  . Diabetes type II Mother   . Coronary artery disease    . Colon cancer Neg Hx   . CAD Father 33    CABG, PCI, died at 53  . Heart attack Father 28  . Hypertension Brother   . Diabetes Brother   . Hyperlipidemia Brother   . Brain cancer Maternal Grandmother   . COPD Paternal Grandfather   . Other      denies family history of DVT/PE  . Stroke Neg Hx   . Other Neg Hx     adrenal dz   Additional social history is notable in that he is married has 5 children 4 grandchildren. He does try to walk. There is no tobacco or alcohol use.  ROS General: Negative; No fevers, chills, or night sweats;  HEENT: Negative; No changes in vision or hearing, sinus congestion, difficulty swallowing Pulmonary: Negative; No cough, wheezing, shortness of breath, hemoptysis Cardiovascular: see HPI GI: History of recent nausea and vomiting. GU: Negative; No dysuria, hematuria, or difficulty voiding Musculoskeletal: Negative; no myalgias, joint pain, or weakness Hematologic/Oncology: Negative; no easy bruising, bleeding Endocrine: Negative; no heat/cold intolerance; no diabetes Neuro: Right MCA stroke with residual left-sided weakness Skin: Negative; No rashes or skin lesions Psychiatric: Negative; No behavioral problems, depression Sleep: Positive for sleep apnea on CPAP; No snoring, daytime sleepiness, hypersomnolence, bruxism, restless legs, hypnogognic hallucinations, no cataplexy Other  comprehensive 14 point system review is negative.  PE BP 138/70 mmHg  Pulse 61  Ht _0  (1.854 m)  Wt 216 lb (97.977 kg)  BMI 28.50 kg/m2  Wt Readings from Last 3 Encounters:  05/22/15 216 lb (97.977 kg)  05/19/15 214 lb 8 oz (97.297 kg)  05/15/15 215 lb (97.523 kg)   General: Alert, oriented, no distress.  Skin: normal turgor, no rashes HEENT: Normocephalic, atraumatic. Pupils round and reactive; sclera anicteric;no lid lag.  Nose without nasal septal hypertrophy Mouth/Parynx benign; Mallinpatti scale 3/4 Neck: Thick neck; No JVD, no carotid briuts Lungs: clear to ausculatation and percussion; no wheezing or rales Heart: RRR, s1 s2 normal 1/6 sem; no diastolic murmur.  No rubs thrills or heaves. Abdomen: soft, nontender; no hepatosplenomehaly, BS+; abdominal aorta nontender and not dilated by palpation. Pulses 2+ Extremities: no clubbing cyanosis or edema, Homan's sign negative  Neurologic: Residual hemiparesis on the left. Psychologic: normal affect and mood.  ECG (independently read by me):  Normal sinus rhythm at 61 bpm. Poor R wave progression. Lateral ST segment changes.  QTc interval 469 ms  September 2016 ECG (independently read by me): Sinus tachycardia 1 18 bpm.  Left axis deviation.  Cannot rule out old anterolateral infarct.  ECG (independently read by me): Normal sinus rhythm at 93 bpm.  QS  complex anteriorly compatible with old anterior MI  April 2016 ECG (independently read by me): Normal sinus rhythm at 79 bpm.  Old anteroseptal MI with QS complex V1 through V3.  LVH by voltage criteria in aVL.  Increased QTc interval 488 ms.  ECG: Normal sinus rhythm at 60. QS complex in V1 through V3 concordant with his documented LAD region scar. T-wave inversion in leads 1 and avL unchanged.   LABS:  BMP Latest Ref Rng 01/31/2015 01/18/2015 01/03/2015  Glucose 65 - 99 mg/dL 111(H) 112(H) 104(H)  BUN 6 - 20 mg/dL _0 Creatinine 0.61 - 1.24 mg/dL 0.97 0.84 0.61    Sodium 135 - 145 mmol/L 142 139 141  Potassium 3.5 - 5.1 mmol/L 3.8 4.2 4.0  Chloride 101 - 111 mmol/L 110 105 105  CO2 22 - 32 mmol/L _1 Calcium 8.9 - 10.3 mg/dL 8.5(L) 8.6 8.7    Hepatic Function Latest Ref Rng 12/27/2014 12/26/2014 12/25/2014  Total Protein 6.5 - 8.1 g/dL 4.5(L) 5.2(L) 5.5(L)  Albumin 3.5 - 5.0 g/dL 1.8(L) 2.1(L) 2.3(L)  AST 15 - 41 U/L 39 31 27  ALT 17 - 63 U/L 36 33 24  Alk Phosphatase 38 - 126 U/L 51 51 58  Total Bilirubin 0.3 - 1.2 mg/dL 0.4 0.4 0.2(L)    CBC Latest Ref Rng 01/31/2015 12/27/2014 12/26/2014  WBC 4.0 - 10.5 K/uL 7.8 6.1 9.1  Hemoglobin 13.0 - 17.0 g/dL 13.0 11.2(L) 12.1(L)  Hematocrit 39.0 - 52.0 % 40.9 33.7(L) 35.6(L)  Platelets 150 - 400 K/uL 193 136(L) 166   Lab Results  Component Value Date   MCV 91.5 01/31/2015   MCV 87.1 12/27/2014   MCV 86.0 12/26/2014   Lab Results  Component Value Date   TSH 1.035 12/23/2014   Lab Results  Component Value Date   HGBA1C 5.6 02/13/2014    BNP    Component Value Date/Time   PROBNP 1224.0* 06/30/2011 0202    Lipid Panel     Component Value Date/Time   CHOL 113 07/22/2014 1129   CHOL 199 01/04/2013 1206   TRIG 104 07/22/2014 1129   TRIG 113 01/04/2013 1206   HDL 38* 07/22/2014 1129   HDL 47 01/04/2013 1206   CHOLHDL 3.0 07/22/2014 1129   VLDL 21 07/22/2014 1129   LDLCALC 54 07/22/2014 1129   LDLCALC 129* 01/04/2013 1206     RADIOLOGY: No results found.    ASSESSMENT AND PLAN: Mr. Remo is a 65 year old gentleman with a history of morbid obesity, hypertension, known coronary artery disease who is status post stenting of his RCA in May 2011 with subsequent CABG surgery. In April 2013 he was found to have subtotal stenosis in a very large circumflex vessel and underwent successful insertion of a large Resolute DES stent. His last nuclear perfusion study in May 2013  revealed an ejection fraction approximately 37%.   He has suffered a large right MCA CVA.  He has  implantable loop recorder to assess for potential atrial fibrillation.  He subsequently suffered bilateral submassive pulmonary emboli and has developed a cardiomyopathy with a drop in ejection fraction to 25-30% noted at that time.   A follow-up CT revealed a chronic filling defect in the distal left vertebral artery compatible with soft plaque or thromboembolic disease.  He has soft calcified plaque in the right vertebral artery region.  He had improved right middle cerebral artery.  patency since November 2015 without intracranial arterial occlusion.  There was evidence for  his chronic right MCA infarct without acute intracranial abnormality.  1.  I last saw him, he was tachycardic.  I felt this was contributed by his Ritalin. This had been discontinued. He had been on low-dose carvedilol, but when he had episodes of hypotension during his hospitalization.  His cardiac medications were discontinued. His resting pulse is now 61 and he is maintaining sinus rhythm. I have recommended that he only needs to hold Xarelto 48 hours prior to his dental surgery.  Once he is stable after his tooth extraction and when he is given clearance to resume Xarelto he can then also reinitiate 81 mg aspirin in light of his underlying CAD.  He has not been using his CPAP with 100% compliance.  I feel this may be a mask issue since he states the tubing is getting caught in his bed.  I haveve suggested changing to a dream mask  In which the tubing is above his head and would be above the pillow and should not interfere with his sleeping or getting caught in the bed.  Time spent: 25 minutes  [name], MD, [facility]  05/22/2015 5:20 PM

## 2015-05-26 ENCOUNTER — Encounter: Payer: Self-pay | Admitting: Endocrinology

## 2015-05-26 ENCOUNTER — Ambulatory Visit (INDEPENDENT_AMBULATORY_CARE_PROVIDER_SITE_OTHER): Payer: Medicare Other | Admitting: Endocrinology

## 2015-05-26 VITALS — BP 126/84 | HR 72 | Temp 97.7°F | Ht 73.0 in | Wt 216.0 lb

## 2015-05-26 DIAGNOSIS — E876 Hypokalemia: Secondary | ICD-10-CM | POA: Diagnosis not present

## 2015-05-26 MED ORDER — FLUDROCORTISONE ACETATE 0.1 MG PO TABS: 0.1000 mg | ORAL_TABLET | Freq: Two times a day (BID) | ORAL | Status: DC

## 2015-05-26 NOTE — Patient Instructions (Addendum)
Please stop taking the prednisone.   Please continue the same fludrocortisone.   Please do the "ACTH" test in 2-4 weeks.  If this is normal, you have non-adrenal low blood pressure, so I'll step aside then.  Don't worry about the flonase.   Call sooner if you have dizziness.

## 2015-05-26 NOTE — Progress Notes (Signed)
Subjective:    Patient ID: Gregory Wiley, male    DOB: Apr 22, 1950, 65 y.o.   MRN: [id number]  HPI Pt returns for f/u of hypotension (he was in the hospital in Oct of 2016, with pneumonia and sepsis; prior to that, he had been hospitalized with hypotension.  cortisol was low, but ACTH test was done after she had already received IV-HC; plan is to taper off prednisone, and manage hypotension with florinef).  pt states he feels no different since the prednisone was decreased. He has dizziness only when he takes baclofen.  He stopped it 1 week ago, and has had no dizziness since then.   Past Medical History  Diagnosis Date  . Coronary artery disease     stent, then CABG 07/20/10  . Hypertension   . Hyperlipidemia   . Obesity (BMI 30-39.9)   . H/O cardiomyopathy     ischemic, now last echo 07/30/11, EF 38%W  . Diverticulosis   . Internal hemorrhoids   . Tubular adenoma of colon   . Plantar fasciitis of left foot   . Basal cell carcinoma of skin of left ankle   . NSTEMI (non-ST elevated myocardial infarction) ([place])     NSTEMI- last cath 06/2011-Stent to LCX-DES, last nuc 07/30/11 low risk  . MI (myocardial infarction) ([place])     "I had 4 before I retired in 2006" (11/11/2014)  . Pulmonary embolism ([place]) 03/2014  . GERD (gastroesophageal reflux disease)   . Bleeding stomach ulcer 2015  . Daily headache     "for the past month" (11/11/2014)  . Stroke ([place]) 01/2014    "no control of LUE, can slightly LLE; memory problems since" (11/11/2014)  . Arthritis     "back" (11/11/2014)  . Gout   . Depression   . Pneumonia     hosp. 12/2014  . Anxiety   . History of hiatal hernia   . 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 OSA on CPAP     since July 2013- uses CPAP sometimes , states he has lost 147 lbs. since stroke & doesn't use the CPAP as much as he use to.   . Adrenal insufficiency [facility])     Past Surgical History  Procedure Laterality Date  . Coronary artery bypass  graft  07/20/2010     LIMA-LAD; VG-ACUTE MARG of RCA; Seq VG-distal RCA & then pda  . Coronary angioplasty with stent placement  07/01/2011    DES-Resolute to native LCX  . Coronary angioplasty with stent placement  12/01/2007    COMPLEX 5 LESION PCI INCLUDING CUTTING BALLOON AND 4 CYPHER DESs  . Knee arthroscopy Right   . Hand surgery Right     to take glass out  . Colonoscopy N/A 02/10/2013    Procedure: COLONOSCOPY;  Surgeon: Ladene Artist, MD;  Location: [facility] ENDOSCOPY;  Service: Endoscopy;  Laterality: N/A;  . Retinal detachment surgery Right   . Loop recorder implant  02/15/2014    MDT LINQ implanted by Dr Rayann Heman for cryptogenic stroke  . Tee without cardioversion N/A 02/15/2014    Procedure: TRANSESOPHAGEAL ECHOCARDIOGRAM (TEE);  Surgeon: Josue Hector, MD;  Location: [facility] ENDOSCOPY;  Service: Cardiovascular;  Laterality: N/A;  . Left heart catheterization with coronary/graft angiogram  07/01/2011    Procedure: LEFT HEART CATHETERIZATION WITH Beatrix Fetters;  Surgeon: Sanda Klein, MD;  Location: [facility] CATH LAB;  Service: Cardiovascular;;  . Percutaneous coronary stent intervention (pci-s) Right 07/01/2011  Procedure: PERCUTANEOUS CORONARY STENT INTERVENTION (PCI-S);  Surgeon: Sanda Klein, MD;  Location: [facility] CATH LAB;  Service: Cardiovascular;  Laterality: Right;  . Umbilical hernia repair  2006  . Hernia repair    . Basal cell carcinoma excision Left 2015    ankle  . Esophagogastroduodenoscopy  01/2014    gastric ulcer, erosive gastroduodenitis, H Pylori negative.   . Esophagogastroduodenoscopy (egd) with propofol N/A 11/14/2014    Procedure: ESOPHAGOGASTRODUODENOSCOPY (EGD) WITH PROPOFOL;  Surgeon: Milus Banister, MD;  Location: [place];  Service: Endoscopy;  Laterality: N/A;  . Intramedullary (im) nail intertrochanteric Right 02/07/2015    Procedure: INTRAMEDULLARY (IM) NAIL INTERTROCHANTRIC RIGHT HIP;  Surgeon: Renette Butters, MD;  Location: [place];  Service:  Orthopedics;  Laterality: Right;    Social History   Social History  . Marital Status: Married    Spouse Name: N/A  . Number of Children: 5  . Years of Education: N/A   Occupational History  .      works at the [employer]  . Smoking status: Former Smoker -- 2.00 packs/day for 4 years    Types: Cigarettes    Quit date: 01/30/1969  . Smokeless tobacco: Never Used     Comment: "quit smoking cigarettes  in the 1970's"  . Alcohol Use: 0.0 oz/week    0 Standard drinks or equivalent per week     Comment: 11/11/2014 "1-2 times per year"  . Drug Use: Yes    Special: Marijuana     Comment: "smoked some pot in college"  . Sexual Activity: Not Currently   Other Topics Concern  . 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      Current Outpatient Prescriptions on File Prior to Visit  Medication Sig Dispense Refill  . acetaminophen (TYLENOL) 325 MG tablet Take 1 tablet (325 mg total) by mouth every 6 (six) hours as needed for mild pain.    Marland Kitchen ALPRAZolam (XANAX) 0.25 MG tablet Take 0.5 tablets (0.125 mg total) by mouth every 8 (eight) hours as needed for anxiety. 45 tablet 5  . amoxicillin (AMOXIL) 500 MG tablet Take 500 mg by mouth 4 (four) times daily.    . fluticasone (FLONASE) 50 MCG/ACT nasal spray Place 2 sprays into both nostrils daily as needed for allergies or rhinitis.    Marland Kitchen HYDROcodone-acetaminophen (NORCO) 5-325 MG tablet Take 1-2 tablets by mouth every 6 (six) hours as needed for moderate pain. 90 tablet 0  . hydrocortisone (ANUSOL-HC) 25 MG suppository Place 25 mg rectally as needed for hemorrhoids or itching.    . Melatonin 10 MG CAPS Take 1 capsule by mouth at bedtime as needed (sleep).     . midodrine (PROAMATINE) 2.5 MG tablet Take 1 tablet (2.5 mg total) by mouth 3  (three) times daily with meals. 90 tablet 2  . nitroGLYCERIN (NITROSTAT) 0.4 MG SL tablet Place 1 tablet (0.4 mg total) under the tongue every 5 (five) minutes as needed. For chest pain 25 tablet 12  . OVER THE COUNTER MEDICATION Inhale 1 application into the lungs at bedtime. C-PAP    . pantoprazole (PROTONIX) 40 MG tablet Take 1 tablet (40 mg total) by mouth 2 (two) times daily. 60 tablet 8  . potassium chloride (K-DUR) 10  MEQ tablet TAKE 4 TABLETS (40 MEQ TOTAL) BY MOUTH DAILY. 120 tablet 2  . promethazine (PHENERGAN) 12.5 MG tablet Take 1-2 tablets (12.5-25 mg total) by mouth every 6 (six) hours as needed for nausea or vomiting. 30 tablet 0  . rivaroxaban (XARELTO) 20 MG TABS tablet Take 20 mg by mouth daily with supper.    . rosuvastatin (CRESTOR) 40 MG tablet Take 40 mg by mouth at bedtime.    . Sennosides (SENOKOT PO) Take 2 tablets by mouth 2 (two) times daily.     . sertraline (ZOLOFT) 50 MG tablet Take 1 tablet (50 mg total) by mouth daily. 30 tablet 11  . colchicine 0.6 MG tablet Take 0.6 mg by mouth as needed (for gout). Reported on 05/26/2015  0   No current facility-administered medications on file prior to visit.    Allergies  Allergen Reactions  . Diphenhydramine Hcl Anaphylaxis    Family History  Problem Relation Age of Onset  . Diabetes type II Mother   . Coronary artery disease    . Colon cancer Neg Hx   . CAD Father 53    CABG, PCI, died at 33  . Heart attack Father 31  . Hypertension Brother   . Diabetes Brother   . Hyperlipidemia Brother   . Brain cancer Maternal Grandmother   . COPD Paternal Grandfather   . Other      denies family history of DVT/PE  . Stroke Neg Hx   . Other Neg Hx     adrenal dz    BP 126/84 mmHg  Pulse 72  Temp(Src) 97.7 F (36.5 C) (Oral)  Ht 6\' 1"  (1.854 m)  Wt 216 lb (97.977 kg)  BMI 28.50 kg/m2  SpO2 97%    Review of Systems Denies LOC    Objective:   Physical Exam VITAL SIGNS:  See vs page GENERAL: no distress.   In wheelchair Ext: no edema LUNGS:  Clear to auscultation HEART:  Regular rate and rhythm without murmurs noted. Normal S1,S2.         Assessment & Plan:  Hypotension: well-controlled.  Unlikely adrenal-related.  We discussed rechecking ACTH test now vs stopping prednisone first.  They request the latter.    Patient is advised the following: Patient Instructions  Please stop taking the prednisone.   Please continue the same fludrocortisone.   Please do the "ACTH" test in 2-4 weeks.  If this is normal, you have non-adrenal low blood pressure, so I'll step aside then.  Don't worry about the flonase.   Call sooner if you have dizziness.

## 2015-05-29 NOTE — Progress Notes (Signed)
Carelink summary report received. Battery status OK. Normal device function. No new symptom episodes, tachy episodes, brady, or pause episodes. No new AF episodes. Monthly summary reports and ROV/PRN 

## 2015-05-30 ENCOUNTER — Telehealth: Payer: Self-pay | Admitting: *Deleted

## 2015-05-30 NOTE — Telephone Encounter (Signed)
Faxed CPAP supply order to [facility]. 

## 2015-05-31 ENCOUNTER — Other Ambulatory Visit: Payer: Self-pay | Admitting: Neurology

## 2015-06-01 ENCOUNTER — Other Ambulatory Visit: Payer: Self-pay | Admitting: *Deleted

## 2015-06-01 MED ORDER — RIVAROXABAN 20 MG PO TABS: 20.0000 mg | ORAL_TABLET | Freq: Every day | ORAL | Status: DC

## 2015-06-01 NOTE — Telephone Encounter (Signed)
Xarelto r/f per faxed request/fim

## 2015-06-02 ENCOUNTER — Telehealth: Payer: Self-pay | Admitting: Neurology

## 2015-06-02 NOTE — Telephone Encounter (Signed)
PA completed and approved.  See med annotations for details.  I have spoken with [name] and let her know this has been done/fim

## 2015-06-02 NOTE — Telephone Encounter (Signed)
Spouse [name] called, [facility] has advised that rivaroxaban (XARELTO) 20 MG TABS tablet needs PA.

## 2015-06-12 ENCOUNTER — Ambulatory Visit (INDEPENDENT_AMBULATORY_CARE_PROVIDER_SITE_OTHER): Payer: Medicare Other | Admitting: *Deleted

## 2015-06-12 DIAGNOSIS — I63411 Cerebral infarction due to embolism of right middle cerebral artery: Secondary | ICD-10-CM | POA: Diagnosis not present

## 2015-06-12 NOTE — Progress Notes (Signed)
Carelink Summary Report / Loop Recorder 

## 2015-06-16 ENCOUNTER — Other Ambulatory Visit (INDEPENDENT_AMBULATORY_CARE_PROVIDER_SITE_OTHER): Payer: Medicare Other

## 2015-06-16 DIAGNOSIS — E876 Hypokalemia: Secondary | ICD-10-CM

## 2015-06-16 LAB — BASIC METABOLIC PANEL
BUN: 15 mg/dL (ref 6–23)
CO2: 27 mEq/L (ref 19–32)
Calcium: 8.8 mg/dL (ref 8.4–10.5)
Chloride: 108 mEq/L (ref 96–112)
Creatinine, Ser: 0.88 mg/dL (ref 0.40–1.50)
GFR: 92.23 mL/min (ref >60.00)
Glucose, Bld: 93 mg/dL (ref 70–99)
Potassium: 3.8 mEq/L (ref 3.5–5.1)
Sodium: 142 mEq/L (ref 135–145)

## 2015-06-16 LAB — CORTISOL
Cortisol, Plasma: 7.6 ug/dL
Cortisol, Plasma: 15.6 ug/dL

## 2015-06-16 MED ORDER — COSYNTROPIN 0.25 MG IJ SOLR
0.2500 mg | Freq: Once | INTRAMUSCULAR | Status: AC
  Administered 2015-06-16: 0.25 mg via INTRAMUSCULAR

## 2015-06-17 HISTORY — PX: PICC LINE PLACE PERIPHERAL (ARMC HX): HXRAD1248

## 2015-06-21 ENCOUNTER — Other Ambulatory Visit: Payer: Self-pay | Admitting: Family Medicine

## 2015-06-29 ENCOUNTER — Other Ambulatory Visit: Payer: Self-pay | Admitting: Cardiovascular Disease

## 2015-06-30 NOTE — Telephone Encounter (Signed)
Rx(s) sent to pharmacy electronically.  

## 2015-07-01 ENCOUNTER — Emergency Department (HOSPITAL_COMMUNITY): Payer: Medicare Other

## 2015-07-01 ENCOUNTER — Ambulatory Visit (HOSPITAL_COMMUNITY)
Admission: EM | Admit: 2015-07-01 | Discharge: 2015-07-01 | Disposition: A | Discharge status: Nursing Home (Includes ICF) | Payer: Medicare Other | Source: Home / Self Care | Attending: Emergency Medicine | Admitting: Emergency Medicine

## 2015-07-01 ENCOUNTER — Encounter (HOSPITAL_COMMUNITY): Payer: Self-pay | Admitting: Emergency Medicine

## 2015-07-01 ENCOUNTER — Encounter (HOSPITAL_COMMUNITY): Payer: Self-pay | Admitting: *Deleted

## 2015-07-01 ENCOUNTER — Observation Stay (HOSPITAL_COMMUNITY)
Admission: EM | Admit: 2015-07-01 | Discharge: 2015-07-02 | Disposition: A | Discharge status: Home / Self Care | Payer: Medicare Other | Source: Home / Self Care | Attending: Internal Medicine | Admitting: Internal Medicine

## 2015-07-01 DIAGNOSIS — Z8249 Family history of ischemic heart disease and other diseases of the circulatory system: Secondary | ICD-10-CM

## 2015-07-01 DIAGNOSIS — Z87891 Personal history of nicotine dependence: Secondary | ICD-10-CM

## 2015-07-01 DIAGNOSIS — I2782 Chronic pulmonary embolism: Secondary | ICD-10-CM

## 2015-07-01 DIAGNOSIS — J029 Acute pharyngitis, unspecified: Secondary | ICD-10-CM | POA: Diagnosis not present

## 2015-07-01 DIAGNOSIS — R509 Fever, unspecified: Secondary | ICD-10-CM

## 2015-07-01 DIAGNOSIS — G4733 Obstructive sleep apnea (adult) (pediatric): Secondary | ICD-10-CM | POA: Insufficient documentation

## 2015-07-01 DIAGNOSIS — Z85828 Personal history of other malignant neoplasm of skin: Secondary | ICD-10-CM | POA: Insufficient documentation

## 2015-07-01 DIAGNOSIS — R059 Cough, unspecified: Secondary | ICD-10-CM

## 2015-07-01 DIAGNOSIS — Z7901 Long term (current) use of anticoagulants: Secondary | ICD-10-CM | POA: Insufficient documentation

## 2015-07-01 DIAGNOSIS — Z7982 Long term (current) use of aspirin: Secondary | ICD-10-CM

## 2015-07-01 DIAGNOSIS — E785 Hyperlipidemia, unspecified: Secondary | ICD-10-CM

## 2015-07-01 DIAGNOSIS — R05 Cough: Secondary | ICD-10-CM

## 2015-07-01 DIAGNOSIS — I2510 Atherosclerotic heart disease of native coronary artery without angina pectoris: Secondary | ICD-10-CM | POA: Insufficient documentation

## 2015-07-01 DIAGNOSIS — G8192 Hemiplegia, unspecified affecting left dominant side: Secondary | ICD-10-CM | POA: Diagnosis present

## 2015-07-01 DIAGNOSIS — M7981 Nontraumatic hematoma of soft tissue: Secondary | ICD-10-CM | POA: Diagnosis not present

## 2015-07-01 DIAGNOSIS — Z8673 Personal history of transient ischemic attack (TIA), and cerebral infarction without residual deficits: Secondary | ICD-10-CM

## 2015-07-01 DIAGNOSIS — I889 Nonspecific lymphadenitis, unspecified: Secondary | ICD-10-CM

## 2015-07-01 DIAGNOSIS — I951 Orthostatic hypotension: Secondary | ICD-10-CM

## 2015-07-01 DIAGNOSIS — Z955 Presence of coronary angioplasty implant and graft: Secondary | ICD-10-CM

## 2015-07-01 DIAGNOSIS — J039 Acute tonsillitis, unspecified: Secondary | ICD-10-CM | POA: Diagnosis present

## 2015-07-01 DIAGNOSIS — I959 Hypotension, unspecified: Secondary | ICD-10-CM | POA: Diagnosis present

## 2015-07-01 DIAGNOSIS — E2740 Unspecified adrenocortical insufficiency: Secondary | ICD-10-CM

## 2015-07-01 DIAGNOSIS — I69354 Hemiplegia and hemiparesis following cerebral infarction affecting left non-dominant side: Secondary | ICD-10-CM | POA: Insufficient documentation

## 2015-07-01 DIAGNOSIS — I252 Old myocardial infarction: Secondary | ICD-10-CM | POA: Insufficient documentation

## 2015-07-01 DIAGNOSIS — I2699 Other pulmonary embolism without acute cor pulmonale: Secondary | ICD-10-CM | POA: Diagnosis present

## 2015-07-01 DIAGNOSIS — R221 Localized swelling, mass and lump, neck: Secondary | ICD-10-CM

## 2015-07-01 HISTORY — DX: Hemiplegia, unspecified affecting left nondominant side: G81.94

## 2015-07-01 LAB — I-STAT CG4 LACTIC ACID, ED: Lactic Acid, Venous: 1.78 mmol/L (ref 0.5–2.0)

## 2015-07-01 LAB — COMPREHENSIVE METABOLIC PANEL
ALT: 15 U/L — ABNORMAL LOW (ref 17–63)
AST: 25 U/L (ref 15–41)
Albumin: 3.4 g/dL — ABNORMAL LOW (ref 3.5–5.0)
Alkaline Phosphatase: 53 U/L (ref 38–126)
Anion gap: 10 (ref 5–15)
BUN: 15 mg/dL (ref 6–20)
CO2: 23 mmol/L (ref 22–32)
Calcium: 8.8 mg/dL — ABNORMAL LOW (ref 8.9–10.3)
Chloride: 107 mmol/L (ref 101–111)
Creatinine, Ser: 1.16 mg/dL (ref 0.61–1.24)
GFR calc Af Amer: >60 mL/min (ref >60)
GFR calc non Af Amer: >60 mL/min (ref >60)
Glucose, Bld: 125 mg/dL — ABNORMAL HIGH (ref 65–99)
Potassium: 3.4 mmol/L — ABNORMAL LOW (ref 3.5–5.1)
Sodium: 140 mmol/L (ref 135–145)
Total Bilirubin: 0.5 mg/dL (ref 0.3–1.2)
Total Protein: 7 g/dL (ref 6.5–8.1)

## 2015-07-01 LAB — URINALYSIS, ROUTINE W REFLEX MICROSCOPIC
Glucose, UA: NEGATIVE mg/dL
Hgb urine dipstick: NEGATIVE
Ketones, ur: NEGATIVE mg/dL
Leukocytes, UA: NEGATIVE
Nitrite: NEGATIVE
Protein, ur: NEGATIVE mg/dL
Specific Gravity, Urine: 1.03 (ref 1.005–1.030)
pH: 6 (ref 5.0–8.0)

## 2015-07-01 LAB — CBC WITH DIFFERENTIAL/PLATELET
Basophils Absolute: 0 10*3/uL (ref 0.0–0.1)
Basophils Relative: 0 %
Eosinophils Absolute: 0 10*3/uL (ref 0.0–0.7)
Eosinophils Relative: 0 %
HCT: 35.6 % — ABNORMAL LOW (ref 39.0–52.0)
Hemoglobin: 10.7 g/dL — ABNORMAL LOW (ref 13.0–17.0)
Lymphocytes Relative: 14 %
Lymphs Abs: 0.7 10*3/uL (ref 0.7–4.0)
MCH: 24.5 pg — ABNORMAL LOW (ref 26.0–34.0)
MCHC: 30.1 g/dL (ref 30.0–36.0)
MCV: 81.7 fL (ref 78.0–100.0)
Monocytes Absolute: 0.4 10*3/uL (ref 0.1–1.0)
Monocytes Relative: 7 %
Neutro Abs: 4 10*3/uL (ref 1.7–7.7)
Neutrophils Relative %: 79 %
Platelets: 163 10*3/uL (ref 150–400)
RBC: 4.36 MIL/uL (ref 4.22–5.81)
RDW: 14.4 % (ref 11.5–15.5)
WBC: 5.1 10*3/uL (ref 4.0–10.5)

## 2015-07-01 LAB — POCT RAPID STREP A: Streptococcus, Group A Screen (Direct): NEGATIVE

## 2015-07-01 MED ORDER — SODIUM CHLORIDE 0.9 % IV BOLUS (SEPSIS)
500.0000 mL | Freq: Once | INTRAVENOUS | Status: AC
  Administered 2015-07-01: 500 mL via INTRAVENOUS

## 2015-07-01 MED ORDER — IOPAMIDOL (ISOVUE-300) INJECTION 61%
INTRAVENOUS | Status: AC
  Administered 2015-07-01: 75 mL
  Filled 2015-07-01: qty 75

## 2015-07-01 MED ORDER — ACETAMINOPHEN 325 MG PO TABS
650.0000 mg | ORAL_TABLET | Freq: Once | ORAL | Status: AC
  Administered 2015-07-01: 650 mg via ORAL
  Filled 2015-07-01: qty 2

## 2015-07-01 NOTE — ED Provider Notes (Signed)
CSN: [id number]     Arrival date & time 07/01/15  1540 History   First MD Initiated Contact with Patient 07/01/15 1755     Chief Complaint  Patient presents with  . Lymphadenopathy  . Headache   (Consider location/radiation/quality/duration/timing/severity/associated sxs/prior Treatment) HPI He is a 65 year old man here with his wife for evaluation of sore throat, cough, and lymph nodes. He states for the last 2 days he has woken up with his eyes crusted shut. For the last 2 days he is also vomited on waking. He reports a runny nose, sore throat, and cough. He has had increasing lymphadenopathy on the left side over the last 2 days. He also reports loss of appetite. He denies any nausea. He is tolerating fluids well, but food is difficult due to pain with swallowing. He did have a fever yesterday to 100.7. He has been taking Tylenol.  He has a history of cardiac disease including MI and CHF. He also has a history of stroke with residual left-sided weakness. He has had a PE as well.  Past Medical History  Diagnosis Date  . Coronary artery disease     stent, then CABG 07/20/10  . Hypertension   . Hyperlipidemia   . Obesity (BMI 30-39.9)   . H/O cardiomyopathy     ischemic, now last echo 07/30/11, EF 38%W  . Diverticulosis   . Internal hemorrhoids   . Tubular adenoma of colon   . Plantar fasciitis of left foot   . Basal cell carcinoma of skin of left ankle   . NSTEMI (non-ST elevated myocardial infarction) ([place])     NSTEMI- last cath 06/2011-Stent to LCX-DES, last nuc 07/30/11 low risk  . MI (myocardial infarction) ([place])     "I had 4 before I retired in 2006" (11/11/2014)  . Pulmonary embolism ([place]) 03/2014  . GERD (gastroesophageal reflux disease)   . Bleeding stomach ulcer 2015  . Daily headache     "for the past month" (11/11/2014)  . Stroke ([place]) 01/2014    "no control of LUE, can slightly LLE; memory problems since" (11/11/2014)  . Arthritis     "back" (11/11/2014)  . Gout   .  Depression   . Pneumonia     hosp. 12/2014  . Anxiety   . History of hiatal hernia   . 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 OSA on CPAP     since July 2013- uses CPAP sometimes , states he has lost 147 lbs. since stroke & doesn't use the CPAP as much as he use to.   . Adrenal insufficiency ([place])   . Left hemiplegia [facility])    Past Surgical History  Procedure Laterality Date  . Coronary artery bypass graft  07/20/2010     LIMA-LAD; VG-ACUTE MARG of RCA; Seq VG-distal RCA & then pda  . Coronary angioplasty with stent placement  07/01/2011    DES-Resolute to native LCX  . Coronary angioplasty with stent placement  12/01/2007    COMPLEX 5 LESION PCI INCLUDING CUTTING BALLOON AND 4 CYPHER DESs  . Knee arthroscopy Right   . Hand surgery Right     to take glass out  . Colonoscopy N/A 02/10/2013    Procedure: COLONOSCOPY;  Surgeon: Ladene Artist, MD;  Location: [facility] ENDOSCOPY;  Service: Endoscopy;  Laterality: N/A;  . Retinal detachment surgery Right   . Loop recorder implant  02/15/2014    MDT LINQ implanted by Dr Rayann Heman for  cryptogenic stroke  . Tee without cardioversion N/A 02/15/2014    Procedure: TRANSESOPHAGEAL ECHOCARDIOGRAM (TEE);  Surgeon: Josue Hector, MD;  Location: [facility] ENDOSCOPY;  Service: Cardiovascular;  Laterality: N/A;  . Left heart catheterization with coronary/graft angiogram  07/01/2011    Procedure: LEFT HEART CATHETERIZATION WITH Beatrix Fetters;  Surgeon: Sanda Klein, MD;  Location: [facility] CATH LAB;  Service: Cardiovascular;;  . Percutaneous coronary stent intervention (pci-s) Right 07/01/2011    Procedure: PERCUTANEOUS CORONARY STENT INTERVENTION (PCI-S);  Surgeon: Sanda Klein, MD;  Location: [facility] CATH LAB;  Service: Cardiovascular;  Laterality: Right;  . Umbilical hernia repair  2006  . Hernia repair    . Basal cell carcinoma excision Left 2015    ankle  . Esophagogastroduodenoscopy  01/2014    gastric ulcer, erosive  gastroduodenitis, H Pylori negative.   . Esophagogastroduodenoscopy (egd) with propofol N/A 11/14/2014    Procedure: ESOPHAGOGASTRODUODENOSCOPY (EGD) WITH PROPOFOL;  Surgeon: Milus Banister, MD;  Location: [place];  Service: Endoscopy;  Laterality: N/A;  . Intramedullary (im) nail intertrochanteric Right 02/07/2015    Procedure: INTRAMEDULLARY (IM) NAIL INTERTROCHANTRIC RIGHT HIP;  Surgeon: Renette Butters, MD;  Location: [place];  Service: Orthopedics;  Laterality: Right;  . Cardiac surgery     Family History  Problem Relation Age of Onset  . Diabetes type II Mother   . Coronary artery disease    . Colon cancer Neg Hx   . CAD Father 41    CABG, PCI, died at 9  . Heart attack Father 56  . Hypertension Brother   . Diabetes Brother   . Hyperlipidemia Brother   . Brain cancer Maternal Grandmother   . COPD Paternal Grandfather   . Other      denies family history of DVT/PE  . Stroke Neg Hx   . Other Neg Hx     adrenal dz   Social History  Substance Use Topics  . Smoking status: Former Smoker -- 2.00 packs/day for 4 years    Types: Cigarettes    Quit date: 01/30/1969  . Smokeless tobacco: Never Used     Comment: "quit smoking cigarettes  in the 1970's"  . Alcohol Use: No    Review of Systems As in history of present illness Allergies  Diphenhydramine hcl  Home Medications   Prior to Admission medications   Medication Sig Start Date End Date Taking? Authorizing Provider  acetaminophen (TYLENOL) 325 MG tablet Take 1 tablet (325 mg total) by mouth every 6 (six) hours as needed for mild pain. 12/27/14  Yes Florencia Reasons, MD  ALPRAZolam Duanne Moron) 0.25 MG tablet Take 0.5 tablets (0.125 mg total) by mouth every 8 (eight) hours as needed for anxiety. 04/14/14  Yes Tiffany L Reed, DO  fludrocortisone (FLORINEF) 0.1 MG tablet Take 1 tablet (0.1 mg total) by mouth 2 (two) times daily. 05/26/15  Yes Renato Shin, MD  fluticasone (FLONASE) 50 MCG/ACT nasal spray Place 2 sprays into both  nostrils daily as needed for allergies or rhinitis.   Yes Historical Provider, MD  HYDROcodone-acetaminophen (NORCO) 5-325 MG tablet Take 1-2 tablets by mouth every 6 (six) hours as needed for moderate pain. 05/15/15  Yes Britt Bottom, MD  hydrocortisone (ANUSOL-HC) 25 MG suppository Place 25 mg rectally as needed for hemorrhoids or itching.   Yes Historical Provider, MD  Melatonin 10 MG CAPS Take 1 capsule by mouth at bedtime as needed (sleep).    Yes Historical Provider, MD  midodrine (PROAMATINE) 2.5 MG tablet Take 1 tablet (  2.5 mg total) by mouth 3 (three) times daily with meals. 05/10/15  Yes Renato Shin, MD  OVER THE COUNTER MEDICATION Inhale 1 application into the lungs at bedtime. C-PAP   Yes Historical Provider, MD  pantoprazole (PROTONIX) 40 MG tablet Take 1 tablet (40 mg total) by mouth 2 (two) times daily. 04/05/15  Yes [name], MD  potassium chloride (K-DUR) 10 MEQ tablet TAKE 4 TABLETS (40 MEQ TOTAL) BY MOUTH DAILY. 03/21/15  Yes Tonia Ghent, MD  rivaroxaban (XARELTO) 20 MG TABS tablet Take 1 tablet (20 mg total) by mouth daily with supper. 06/01/15  Yes Britt Bottom, MD  rosuvastatin (CRESTOR) 40 MG tablet Take 1 tablet (40 mg total) by mouth daily. 06/30/15  Yes [name], MD  Sennosides (SENOKOT PO) Take 2 tablets by mouth 2 (two) times daily.    Yes Historical Provider, MD  sertraline (ZOLOFT) 50 MG tablet Take 1 tablet (50 mg total) by mouth daily. 01/09/15  Yes Britt Bottom, MD  amoxicillin (AMOXIL) 500 MG tablet Take 500 mg by mouth 4 (four) times daily.    Historical Provider, MD  colchicine 0.6 MG tablet Take 0.6 mg by mouth as needed (for gout). Reported on 05/26/2015 04/09/14   Historical Provider, MD  nitroGLYCERIN (NITROSTAT) 0.4 MG SL tablet Place 1 tablet (0.4 mg total) under the tongue every 5 (five) minutes as needed. For chest pain 05/21/15   Tonia Ghent, MD  potassium chloride (K-DUR) 10 MEQ tablet TAKE 4 TABLETS BY MOUTH DAILY. 06/21/15   Tonia Ghent, MD  promethazine (PHENERGAN) 12.5 MG tablet Take 1-2 tablets (12.5-25 mg total) by mouth every 6 (six) hours as needed for nausea or vomiting. 10/28/14   Pleas Koch, NP   Meds Ordered and Administered this Visit  Medications - No data to display  BP 91/69 mmHg  Pulse 79  Temp(Src) 97.9 F (36.6 C) (Oral)  SpO2 96% No data found.   Physical Exam  Constitutional: He is oriented to person, place, and time. He appears well-developed and well-nourished. No distress.  His color is somewhat gray.  HENT:  Mouth/Throat: No oropharyngeal exudate.  He has some oropharyngeal erythema, but no exudates. Small amount of nasal discharge present.  Neck: Neck supple.  Cardiovascular: Normal rate, regular rhythm and normal heart sounds.   No murmur heard. Pulmonary/Chest: Effort normal and breath sounds normal. No respiratory distress. He has no wheezes. He has no rales.  Lymphadenopathy:    He has cervical adenopathy (left-sided).  Neurological: He is alert and oriented to person, place, and time.    ED Course  Procedures (including critical care time)  Labs Review Labs Reviewed  POCT RAPID STREP A    Imaging Review No results found.   MDM   1. Fever, unspecified fever cause   2. Hypotension, unspecified hypotension type   3. Cough   4. Sore throat    Rapid strep is negative. He is hypotensive with a documented fever at home. He also has significant cardiac history. I'm concerned for SIRS versus early sepsis.  Discussed with patient and his wife. We will transfer to the emergency room for additional evaluation and monitoring. His wife states she will take him down in their car.  Melony Overly, MD 07/01/15 [phone]

## 2015-07-01 NOTE — ED Notes (Signed)
MD at bedside. 

## 2015-07-01 NOTE — ED Notes (Signed)
Started with HA and dry cough 2 days ago.  Yesterday started with glandular swelling on left side, worsening today, with sensation of difficulty swallowing.  Now also has a sore throat and nausea with 2 episodes vomiting since yesterday.  Fever of 100.7 last night.

## 2015-07-01 NOTE — ED Notes (Signed)
Phlebotomy at bedside.

## 2015-07-01 NOTE — ED Provider Notes (Signed)
CSN: [id number]     Arrival date & time 07/01/15  1901 History   First MD Initiated Contact with Patient 07/01/15 1941     Chief Complaint  Patient presents with  . Hypotension  . Sore Throat     (Consider location/radiation/quality/duration/timing/severity/associated sxs/prior Treatment) The history is provided by the patient and the spouse.     65 year old male with past medical history of chronic adrenal insufficiency, CHF, coronary artery disease, history of pulmonary embolism, who presents with fever and sore throat and left-sided neck swelling. The patient initially presented to urgent care today and was sent here for evaluation given tachycardia and hypotension. The patient states over the last several days, he has had a mild dry cough. Over the last 24 hours, has had worsening sore throat and left-sided neck swelling. He describes the sore throat is constant, aching, and worse with any swallowing. He has had poor by mouth intake due to this pain. Over the last 12 hours, endorses severe lightheadedness with sitting up as well as vomiting due to sensation of nausea with sitting up. This improves with lying down flat. He states he has otherwise been adherent with his medications  Past Medical History  Diagnosis Date  . Coronary artery disease     stent, then CABG 07/20/10  . Hypertension   . Hyperlipidemia   . Obesity (BMI 30-39.9)   . H/O cardiomyopathy     ischemic, now last echo 07/30/11, EF 38%W  . Diverticulosis   . Internal hemorrhoids   . Tubular adenoma of colon   . Plantar fasciitis of left foot   . Basal cell carcinoma of skin of left ankle   . NSTEMI (non-ST elevated myocardial infarction) ([place])     NSTEMI- last cath 06/2011-Stent to LCX-DES, last nuc 07/30/11 low risk  . MI (myocardial infarction) ([place])     "I had 4 before I retired in 2006" (11/11/2014)  . Pulmonary embolism ([place]) 03/2014  . GERD (gastroesophageal reflux disease)   . Bleeding stomach ulcer 2015  .  Daily headache     "for the past month" (11/11/2014)  . Stroke ([place]) 01/2014    "no control of LUE, can slightly LLE; memory problems since" (11/11/2014)  . Arthritis     "back" (11/11/2014)  . Gout   . Depression   . Pneumonia     hosp. 12/2014  . Anxiety   . History of hiatal hernia   . 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 OSA on CPAP     since July 2013- uses CPAP sometimes , states he has lost 147 lbs. since stroke & doesn't use the CPAP as much as he use to.   . Adrenal insufficiency ([place])   . Left hemiplegia [facility])    Past Surgical History  Procedure Laterality Date  . Coronary artery bypass graft  07/20/2010     LIMA-LAD; VG-ACUTE MARG of RCA; Seq VG-distal RCA & then pda  . Coronary angioplasty with stent placement  07/01/2011    DES-Resolute to native LCX  . Coronary angioplasty with stent placement  12/01/2007    COMPLEX 5 LESION PCI INCLUDING CUTTING BALLOON AND 4 CYPHER DESs  . Knee arthroscopy Right   . Hand surgery Right     to take glass out  . Colonoscopy N/A 02/10/2013    Procedure: COLONOSCOPY;  Surgeon: Ladene Artist, MD;  Location: [facility] ENDOSCOPY;  Service: Endoscopy;  Laterality: N/A;  . Retinal detachment  surgery Right   . Loop recorder implant  02/15/2014    MDT LINQ implanted by Dr Rayann Heman for cryptogenic stroke  . Tee without cardioversion N/A 02/15/2014    Procedure: TRANSESOPHAGEAL ECHOCARDIOGRAM (TEE);  Surgeon: Josue Hector, MD;  Location: [facility] ENDOSCOPY;  Service: Cardiovascular;  Laterality: N/A;  . Left heart catheterization with coronary/graft angiogram  07/01/2011    Procedure: LEFT HEART CATHETERIZATION WITH Beatrix Fetters;  Surgeon: Sanda Klein, MD;  Location: [place] CATH LAB;  Service: Cardiovascular;;  . Percutaneous coronary stent intervention (pci-s) Right 07/01/2011    Procedure: PERCUTANEOUS CORONARY STENT INTERVENTION (PCI-S);  Surgeon: Sanda Klein, MD;  [facility] CATH LAB;  Service: Cardiovascular;   Laterality: Right;  . Umbilical hernia repair  2006  . Hernia repair    . Basal cell carcinoma excision Left 2015    ankle  . Esophagogastroduodenoscopy  01/2014    gastric ulcer, erosive gastroduodenitis, H Pylori negative.   . Esophagogastroduodenoscopy (egd) with propofol N/A 11/14/2014    Procedure: ESOPHAGOGASTRODUODENOSCOPY (EGD) WITH PROPOFOL;  Surgeon: Milus Banister, MD;  Location: [place];  Service: Endoscopy;  Laterality: N/A;  . Intramedullary (im) nail intertrochanteric Right 02/07/2015    Procedure: INTRAMEDULLARY (IM) NAIL INTERTROCHANTRIC RIGHT HIP;  Surgeon: Renette Butters, MD;  Location: [place];  Service: Orthopedics;  Laterality: Right;  . Cardiac surgery     Family History  Problem Relation Age of Onset  . Diabetes type II Mother   . Coronary artery disease    . Colon cancer Neg Hx   . CAD Father 68    CABG, PCI, died at 92  . Heart attack Father 96  . Hypertension Brother   . Diabetes Brother   . Hyperlipidemia Brother   . Brain cancer Maternal Grandmother   . COPD Paternal Grandfather   . Other      denies family history of DVT/PE  . Stroke Neg Hx   . Other Neg Hx     adrenal dz   Social History  Substance Use Topics  . Smoking status: Former Smoker -- 2.00 packs/day for 4 years    Types: Cigarettes    Quit date: 01/30/1969  . Smokeless tobacco: Never Used     Comment: "quit smoking cigarettes  in the 1970's"  . Alcohol Use: No    Review of Systems  Constitutional: Positive for chills and fatigue. Negative for fever.  HENT: Positive for congestion, facial swelling, rhinorrhea, sore throat and trouble swallowing.   Eyes: Negative for visual disturbance.  Respiratory: Positive for cough. Negative for shortness of breath and wheezing.   Cardiovascular: Negative for chest pain and leg swelling.  Gastrointestinal: Negative for nausea, vomiting, abdominal pain and diarrhea.  Genitourinary: Negative for dysuria and flank pain.  Musculoskeletal:  Negative for neck pain and neck stiffness.  Skin: Negative for rash.  Allergic/Immunologic: Positive for immunocompromised state.  Neurological: Positive for weakness and light-headedness. Negative for syncope.      Allergies  Diphenhydramine hcl and Adhesive  Home Medications   Prior to Admission medications   Medication Sig Start Date End Date Taking? Authorizing Provider  acetaminophen (TYLENOL) 325 MG tablet Take 1 tablet (325 mg total) by mouth every 6 (six) hours as needed for mild pain. 12/27/14  Yes Florencia Reasons, MD  ALPRAZolam Duanne Moron) 0.25 MG tablet Take 0.5 tablets (0.125 mg total) by mouth every 8 (eight) hours as needed for anxiety. 04/14/14  Yes Tiffany L Reed, DO  aspirin EC 81 MG tablet Take 81 mg  by mouth daily.   Yes Historical Provider, MD  colchicine 0.6 MG tablet Take 0.6 mg by mouth as needed (for gout). Reported on 05/26/2015 04/09/14  Yes Historical Provider, MD  Fexofenadine HCl (ALLEGRA PO) Take 1 tablet by mouth daily as needed (seasonal allergies).   Yes Historical Provider, MD  fludrocortisone (FLORINEF) 0.1 MG tablet Take 1 tablet (0.1 mg total) by mouth 2 (two) times daily. 05/26/15  Yes Renato Shin, MD  fluticasone (FLONASE) 50 MCG/ACT nasal spray Place 2 sprays into both nostrils at bedtime as needed (seasonal allergies).    Yes Historical Provider, MD  HYDROcodone-acetaminophen (NORCO) 5-325 MG tablet Take 1-2 tablets by mouth every 6 (six) hours as needed for moderate pain. 05/15/15  Yes Britt Bottom, MD  hydrocortisone (ANUSOL-HC) 25 MG suppository Place 25 mg rectally as needed for hemorrhoids or itching.   Yes Historical Provider, MD  Melatonin 10 MG CAPS Take 10 mg by mouth at bedtime as needed (sleep).    Yes Historical Provider, MD  midodrine (PROAMATINE) 2.5 MG tablet Take 1 tablet (2.5 mg total) by mouth 3 (three) times daily with meals. 05/10/15  Yes Renato Shin, MD  nitroGLYCERIN (NITROSTAT) 0.4 MG SL tablet Place 1 tablet (0.4 mg total) under the  tongue every 5 (five) minutes as needed. For chest pain Patient taking differently: Place 0.4 mg under the tongue every 5 (five) minutes as needed for chest pain.  05/21/15  Yes Tonia Ghent, MD  OVER THE COUNTER MEDICATION Inhale 1 application into the lungs at bedtime. C-PAP   Yes Historical Provider, MD  pantoprazole (PROTONIX) 40 MG tablet Take 1 tablet (40 mg total) by mouth 2 (two) times daily. 04/05/15  Yes [name], MD  potassium chloride (K-DUR) 10 MEQ tablet TAKE 4 TABLETS (40 MEQ TOTAL) BY MOUTH DAILY. Patient taking differently: Take 40 mEq by mouth daily. TAKE 4 TABLETS (40 MEQ TOTAL) BY MOUTH DAILY 03/21/15  Yes Tonia Ghent, MD  rivaroxaban (XARELTO) 20 MG TABS tablet Take 1 tablet (20 mg total) by mouth daily with supper. 06/01/15  Yes Britt Bottom, MD  rosuvastatin (CRESTOR) 40 MG tablet Take 1 tablet (40 mg total) by mouth daily. Patient taking differently: Take 40 mg by mouth at bedtime.  06/30/15  Yes [name], MD  senna (SENOKOT) 8.6 MG TABS tablet Take 1 tablet by mouth 2 (two) times daily.   Yes Historical Provider, MD  senna-docusate (SENNA S) 8.6-50 MG tablet Take 1 tablet by mouth 2 (two) times daily.   Yes Historical Provider, MD  sertraline (ZOLOFT) 50 MG tablet Take 1 tablet (50 mg total) by mouth daily. 01/09/15  Yes Britt Bottom, MD  tiZANidine (ZANAFLEX) 4 MG tablet Take 4 mg by mouth 2 (two) times daily as needed for muscle spasms.  06/21/15  Yes Historical Provider, MD  potassium chloride (K-DUR) 10 MEQ tablet TAKE 4 TABLETS BY MOUTH DAILY. Patient not taking: Reported on 07/01/2015 06/21/15   Tonia Ghent, MD  promethazine (PHENERGAN) 12.5 MG tablet Take 1-2 tablets (12.5-25 mg total) by mouth every 6 (six) hours as needed for nausea or vomiting. Patient not taking: Reported on 07/01/2015 10/28/14   Pleas Koch, NP   BP 133/76 mmHg  Pulse 97  Temp(Src) 98 F (36.7 C) (Oral)  Resp 18  Ht 5\' 10"  (1.778 m)  Wt 92.216 kg  BMI 29.17 kg/m2   SpO2 95% Physical Exam  Constitutional: He is oriented to person, place, and time.  He appears well-developed and well-nourished. He has a sickly appearance.  HENT:  Head: Normocephalic and atraumatic.  TMs normal b/l. Posterior pharynx with diffuse erythema, red petechiae on soft palate. No peritonsillar edema. No pooling of secretions. Moderate TTP over left mandible, submandibular space and anterior cervical lymph nodes. No skin erythema.  Eyes: Conjunctivae are normal. Pupils are equal, round, and reactive to light.  Neck: Normal range of motion. Neck supple. No JVD present. No tracheal deviation present.  Cardiovascular: Normal rate, normal heart sounds and intact distal pulses.  Exam reveals no friction rub.   No murmur heard. Pulmonary/Chest: Effort normal. No stridor. No respiratory distress. He has no wheezes. He has no rales.  Abdominal: Soft. He exhibits no distension. There is no tenderness.  Musculoskeletal: He exhibits no edema.  Lymphadenopathy:    He has cervical adenopathy.  Neurological: He is alert and oriented to person, place, and time.  Skin: Skin is warm. No rash noted.  Nursing note and vitals reviewed.   ED Course  Procedures (including critical care time) Labs Review Labs Reviewed  CBC WITH DIFFERENTIAL/PLATELET - Abnormal; Notable for the following:    Hemoglobin 10.7 (*)    HCT 35.6 (*)    MCH 24.5 (*)    All other components within normal limits  COMPREHENSIVE METABOLIC PANEL - Abnormal; Notable for the following:    Potassium 3.4 (*)    Glucose, Bld 125 (*)    Calcium 8.8 (*)    Albumin 3.4 (*)    ALT 15 (*)    All other components within normal limits  URINALYSIS, ROUTINE W REFLEX MICROSCOPIC (NOT AT [facility]) - Abnormal; Notable for the following:    Color, Urine AMBER (*)    Bilirubin Urine SMALL (*)    All other components within normal limits  CULTURE, GROUP A STREP ([place])  CULTURE, BLOOD (ROUTINE X 2)  CULTURE, BLOOD (ROUTINE X 2)  URINE  CULTURE  CBC  BASIC METABOLIC PANEL  I-STAT CG4 LACTIC ACID, ED    Imaging Review Dg Chest 2 View  07/01/2015  CLINICAL DATA:  Cough and fever EXAM: CHEST  2 VIEW COMPARISON:  04/07/2014 FINDINGS: Cardiomegaly with negative aortic and hilar contours. Status post CABG. Low volume chest with interstitial crowding. There is no edema, consolidation, effusion, or pneumothorax. Implantable loop recorder. 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 Patient has previous cervical spine CT imaging in 2016. IMPRESSION: Low volume chest without acute finding. Electronically Signed   By: Monte Fantasia M.D.   On: 07/01/2015 20:46   Ct Soft Tissue Neck W Contrast  07/02/2015  CLINICAL DATA:  Initial evaluation for acute left-sided neck swelling with sore throat. EXAM: CT NECK WITH CONTRAST TECHNIQUE: Multidetector CT imaging of the neck was performed using the standard protocol following the bolus administration of intravenous contrast. CONTRAST:  1 ISOVUE-300 IOPAMIDOL (ISOVUE-300) INJECTION 61% COMPARISON:  None. FINDINGS: Visualized portions of the brain demonstrate no acute process. Encephalomalacia noted within the anterior right temporal pole. Partially visualized globes and orbits demonstrate no acute abnormality. Scattered mucosal thickening within the max O sinuses and ethmoidal air cells. Fluid level within the right sphenoid sinus. Mucosal thickening within the left sphenoid sinus. Mastoid air cells are clear. Middle ear cavities are clear. Salivary glands including the parotid glands and submandibular glands are normal. Oral cavity is unremarkable, although evaluation somewhat limited by streak artifact from dental amalgam. No acute abnormality about the dentition. Palatine tonsils are mildly prominent and hyper enhancing, which  may reflect acute tonsillitis. No peritonsillar abscess or other collection. Epiglottis normal. Vallecula clear. No retropharyngeal fluid collection or  edema. Remainder of the hypopharynx and supraglottic larynx within normal limits without acute inflammatory changes. True cords grossly normal. Subglottic airway clear. Approximately 2 cm hypodense nodule with internal calcification present within the inferior left thyroid lobe, indeterminate. Thyroid otherwise within normal limits. Enlarged left level 2 lymph nodes measure up to 13 mm. A few of these nodes are somewhat heterogeneous and partially hypodense in appearance. Increased number of left level 2 P and level 5 nodes as compared to the right. These measure up to 15 mm. Increased number of subcentimeter left level 2/3 nodes as well. There is associated inflammatory stranding within the adjacent fat within the is area of adenopathy in the left neck. Finding is indeterminate, and could reflect supper node adenitis or reactive adenopathy. Nodal malignancy could also have this appearance. No significant adenopathy seen within the right neck. Visualized superior mediastinum within normal limits. Visualized lungs are clear. Normal intravascular enhancement seen throughout the neck. No acute osseous abnormality. No worrisome lytic or blastic osseous lesions. Prominent bridging anterior osteophytes seen throughout the cervical spine. IMPRESSION: 1. Left-sided cervical adenopathy with associated inflammatory stranding as detailed above, indeterminate, but may reflect sequela of suppurative adenitis. Alternatively, these nodes may be reactive in nature. Nodal malignancy could also have this appearance. Clinical followup to resolution is recommended. 2. Question mild enlargement and hyper enhancement of the palatine tonsils, which may reflect acute tonsillitis. Correlation with physical exam and direct visualization recommended. 3. Approximately 2 cm left thyroid nodule, indeterminate. Correlation with dedicated thyroid ultrasound recommended. This could be performed on a nonemergent basis. 4. Prominent anterior bridging  osteophytic spurring throughout the cervical spine. 5. Acute on chronic paranasal sinus disease as above. Electronically Signed   By: Jeannine Boga M.D.   On: 07/02/2015 00:13   I have personally reviewed and evaluated these images and lab results as part of my medical decision-making.   EKG Interpretation None      MDM   65 year old male with past medical history as above who presents with sore throat and left-sided neck pain with fever and hypotension. See history of present illness above. On arrival, patient is moderately hypotensive but otherwise nontoxic on exam. Examination is as above. At this time, my primary suspicion is viral URI versus viral pharyngitis, although cannot rule out bacterial tonsillitis or see suppurative adenitis given his fever and hypotension as well as immunosuppression. Given his reported difficulty swallowing and neck swelling, we'll obtain CT of the neck and brought labs. Will send cultures and start IV fluids. Regarding the patient's hypotension, it is resolved spontaneously at this time. He does have a history of hypertension but per review of records, the patient's baseline blood pressure is usually normotensive in clinic. He also endorses symptoms of orthostasis concerning for volume depletion. Will give 500 mL fluid bolus given his history of ischemic cardiomyopathy.  Labs and imaging reviewed as above. CBC is overall reassuring with normal white blood cell count. Lactic acid is normal. CT of the neck shows tonsillitis as well as suppurative adenitis with no evidence of abscess. The patient continues to be severely lightheaded and nauseous with sitting upright despite improvement in his blood pressure. He does remain orthostatic as well. Given his fever, hypotension, and general malaise with reported inability to sit up without nausea and vomiting, will admit for IV fluids and IV antibiotics. We'll start Unasyn here. Patient is in  agreement with this  plan  Clinical Impression: 1. Adenitis   2. Neck swelling   3. Tonsillitis   4. Orthostatic hypotension     Disposition: Admit  Condition: Stable  Pt seen in conjunction with Dr. Wilmer Floor, MD 07/02/15 [place], MD 07/03/15 [id number]

## 2015-07-01 NOTE — ED Notes (Signed)
Pt. transferred from [facility] urgent care due to hypotension , emesis , sore throat with left neck swelling onset 2 days ago , denies fever or chills. Respirations unlabored / airway intact .

## 2015-07-01 NOTE — ED Notes (Signed)
Report called to Abigail Butts, ED First Nurse.  Notified pt will be coming via [facility] due to it "being easier since he is wheelchair bound" per wife.

## 2015-07-02 ENCOUNTER — Inpatient Hospital Stay (HOSPITAL_COMMUNITY)
Admission: EM | Admit: 2015-07-02 | Discharge: 2015-07-09 | DRG: 556 | Disposition: A | Discharge status: Home Health | Payer: Medicare Other | Attending: Internal Medicine | Admitting: Internal Medicine

## 2015-07-02 ENCOUNTER — Encounter (HOSPITAL_COMMUNITY): Payer: Self-pay | Admitting: Emergency Medicine

## 2015-07-02 DIAGNOSIS — Z86711 Personal history of pulmonary embolism: Secondary | ICD-10-CM

## 2015-07-02 DIAGNOSIS — I252 Old myocardial infarction: Secondary | ICD-10-CM

## 2015-07-02 DIAGNOSIS — R1030 Lower abdominal pain, unspecified: Secondary | ICD-10-CM

## 2015-07-02 DIAGNOSIS — D62 Acute posthemorrhagic anemia: Secondary | ICD-10-CM | POA: Diagnosis present

## 2015-07-02 DIAGNOSIS — S301XXA Contusion of abdominal wall, initial encounter: Secondary | ICD-10-CM | POA: Diagnosis present

## 2015-07-02 DIAGNOSIS — Z6830 Body mass index (BMI) 30.0-30.9, adult: Secondary | ICD-10-CM

## 2015-07-02 DIAGNOSIS — I480 Paroxysmal atrial fibrillation: Secondary | ICD-10-CM | POA: Diagnosis present

## 2015-07-02 DIAGNOSIS — I69354 Hemiplegia and hemiparesis following cerebral infarction affecting left non-dominant side: Secondary | ICD-10-CM

## 2015-07-02 DIAGNOSIS — F329 Major depressive disorder, single episode, unspecified: Secondary | ICD-10-CM | POA: Diagnosis present

## 2015-07-02 DIAGNOSIS — B957 Other staphylococcus as the cause of diseases classified elsewhere: Secondary | ICD-10-CM | POA: Diagnosis present

## 2015-07-02 DIAGNOSIS — Z85828 Personal history of other malignant neoplasm of skin: Secondary | ICD-10-CM

## 2015-07-02 DIAGNOSIS — Z87891 Personal history of nicotine dependence: Secondary | ICD-10-CM

## 2015-07-02 DIAGNOSIS — Z7982 Long term (current) use of aspirin: Secondary | ICD-10-CM

## 2015-07-02 DIAGNOSIS — R509 Fever, unspecified: Secondary | ICD-10-CM

## 2015-07-02 DIAGNOSIS — I889 Nonspecific lymphadenitis, unspecified: Secondary | ICD-10-CM | POA: Insufficient documentation

## 2015-07-02 DIAGNOSIS — G4733 Obstructive sleep apnea (adult) (pediatric): Secondary | ICD-10-CM | POA: Diagnosis present

## 2015-07-02 DIAGNOSIS — Z7901 Long term (current) use of anticoagulants: Secondary | ICD-10-CM

## 2015-07-02 DIAGNOSIS — I2699 Other pulmonary embolism without acute cor pulmonale: Secondary | ICD-10-CM | POA: Diagnosis not present

## 2015-07-02 DIAGNOSIS — J039 Acute tonsillitis, unspecified: Secondary | ICD-10-CM | POA: Diagnosis not present

## 2015-07-02 DIAGNOSIS — R109 Unspecified abdominal pain: Secondary | ICD-10-CM

## 2015-07-02 DIAGNOSIS — I10 Essential (primary) hypertension: Secondary | ICD-10-CM | POA: Diagnosis present

## 2015-07-02 DIAGNOSIS — G8192 Hemiplegia, unspecified affecting left dominant side: Secondary | ICD-10-CM

## 2015-07-02 DIAGNOSIS — I959 Hypotension, unspecified: Secondary | ICD-10-CM | POA: Diagnosis not present

## 2015-07-02 DIAGNOSIS — Z955 Presence of coronary angioplasty implant and graft: Secondary | ICD-10-CM

## 2015-07-02 DIAGNOSIS — M7981 Nontraumatic hematoma of soft tissue: Principal | ICD-10-CM | POA: Diagnosis present

## 2015-07-02 DIAGNOSIS — I255 Ischemic cardiomyopathy: Secondary | ICD-10-CM | POA: Diagnosis present

## 2015-07-02 DIAGNOSIS — Z951 Presence of aortocoronary bypass graft: Secondary | ICD-10-CM

## 2015-07-02 DIAGNOSIS — R7881 Bacteremia: Secondary | ICD-10-CM | POA: Diagnosis present

## 2015-07-02 DIAGNOSIS — K219 Gastro-esophageal reflux disease without esophagitis: Secondary | ICD-10-CM | POA: Diagnosis present

## 2015-07-02 DIAGNOSIS — I951 Orthostatic hypotension: Secondary | ICD-10-CM | POA: Diagnosis present

## 2015-07-02 DIAGNOSIS — I2510 Atherosclerotic heart disease of native coronary artery without angina pectoris: Secondary | ICD-10-CM | POA: Diagnosis present

## 2015-07-02 DIAGNOSIS — Z7952 Long term (current) use of systemic steroids: Secondary | ICD-10-CM

## 2015-07-02 DIAGNOSIS — E2740 Unspecified adrenocortical insufficiency: Secondary | ICD-10-CM | POA: Diagnosis present

## 2015-07-02 DIAGNOSIS — E785 Hyperlipidemia, unspecified: Secondary | ICD-10-CM | POA: Diagnosis present

## 2015-07-02 DIAGNOSIS — Z79899 Other long term (current) drug therapy: Secondary | ICD-10-CM

## 2015-07-02 DIAGNOSIS — E876 Hypokalemia: Secondary | ICD-10-CM | POA: Diagnosis present

## 2015-07-02 DIAGNOSIS — E669 Obesity, unspecified: Secondary | ICD-10-CM | POA: Diagnosis present

## 2015-07-02 LAB — CBC
HCT: 29.8 % — ABNORMAL LOW (ref 39.0–52.0)
Hemoglobin: 9.4 g/dL — ABNORMAL LOW (ref 13.0–17.0)
MCH: 25.2 pg — ABNORMAL LOW (ref 26.0–34.0)
MCHC: 31.5 g/dL (ref 30.0–36.0)
MCV: 79.9 fL (ref 78.0–100.0)
Platelets: 117 10*3/uL — ABNORMAL LOW (ref 150–400)
RBC: 3.73 MIL/uL — ABNORMAL LOW (ref 4.22–5.81)
RDW: 14.4 % (ref 11.5–15.5)
WBC: 3.9 10*3/uL — ABNORMAL LOW (ref 4.0–10.5)

## 2015-07-02 LAB — COMPREHENSIVE METABOLIC PANEL
ALT: 14 U/L — ABNORMAL LOW (ref 17–63)
AST: 26 U/L (ref 15–41)
Albumin: 3.1 g/dL — ABNORMAL LOW (ref 3.5–5.0)
Alkaline Phosphatase: 50 U/L (ref 38–126)
Anion gap: 12 (ref 5–15)
BUN: 12 mg/dL (ref 6–20)
CO2: 20 mmol/L — ABNORMAL LOW (ref 22–32)
Calcium: 8.6 mg/dL — ABNORMAL LOW (ref 8.9–10.3)
Chloride: 108 mmol/L (ref 101–111)
Creatinine, Ser: 0.96 mg/dL (ref 0.61–1.24)
GFR calc Af Amer: >60 mL/min (ref >60)
GFR calc non Af Amer: >60 mL/min (ref >60)
Glucose, Bld: 102 mg/dL — ABNORMAL HIGH (ref 65–99)
Potassium: 3.1 mmol/L — ABNORMAL LOW (ref 3.5–5.1)
Sodium: 140 mmol/L (ref 135–145)
Total Bilirubin: 0.6 mg/dL (ref 0.3–1.2)
Total Protein: 6.4 g/dL — ABNORMAL LOW (ref 6.5–8.1)

## 2015-07-02 LAB — CBC WITH DIFFERENTIAL/PLATELET
Basophils Absolute: 0 10*3/uL (ref 0.0–0.1)
Basophils Relative: 0 %
Eosinophils Absolute: 0 10*3/uL (ref 0.0–0.7)
Eosinophils Relative: 0 %
HCT: 31.9 % — ABNORMAL LOW (ref 39.0–52.0)
Hemoglobin: 9.8 g/dL — ABNORMAL LOW (ref 13.0–17.0)
Lymphocytes Relative: 20 %
Lymphs Abs: 0.9 10*3/uL (ref 0.7–4.0)
MCH: 24.6 pg — ABNORMAL LOW (ref 26.0–34.0)
MCHC: 30.7 g/dL (ref 30.0–36.0)
MCV: 79.9 fL (ref 78.0–100.0)
Monocytes Absolute: 0.3 10*3/uL (ref 0.1–1.0)
Monocytes Relative: 6 %
Neutro Abs: 3.3 10*3/uL (ref 1.7–7.7)
Neutrophils Relative %: 74 %
Platelets: 137 10*3/uL — ABNORMAL LOW (ref 150–400)
RBC: 3.99 MIL/uL — ABNORMAL LOW (ref 4.22–5.81)
RDW: 14.2 % (ref 11.5–15.5)
WBC: 4.5 10*3/uL (ref 4.0–10.5)

## 2015-07-02 LAB — BASIC METABOLIC PANEL
Anion gap: 10 (ref 5–15)
BUN: 14 mg/dL (ref 6–20)
CO2: 20 mmol/L — ABNORMAL LOW (ref 22–32)
Calcium: 8.2 mg/dL — ABNORMAL LOW (ref 8.9–10.3)
Chloride: 109 mmol/L (ref 101–111)
Creatinine, Ser: 1.03 mg/dL (ref 0.61–1.24)
GFR calc Af Amer: >60 mL/min (ref >60)
GFR calc non Af Amer: >60 mL/min (ref >60)
Glucose, Bld: 115 mg/dL — ABNORMAL HIGH (ref 65–99)
Potassium: 3.7 mmol/L (ref 3.5–5.1)
Sodium: 139 mmol/L (ref 135–145)

## 2015-07-02 MED ORDER — ROSUVASTATIN CALCIUM 40 MG PO TABS: 40.0000 mg | ORAL_TABLET | Freq: Every day | ORAL | Status: DC

## 2015-07-02 MED ORDER — HYDROCORTISONE ACETATE 25 MG RE SUPP
25.0000 mg | RECTAL | Status: DC | PRN
  Filled 2015-07-02: qty 1

## 2015-07-02 MED ORDER — VANCOMYCIN HCL 10 G IV SOLR
2000.0000 mg | Freq: Once | INTRAVENOUS | Status: AC
  Administered 2015-07-02: 2000 mg via INTRAVENOUS
  Filled 2015-07-02: qty 2000

## 2015-07-02 MED ORDER — COLCHICINE 0.6 MG PO TABS: 0.6000 mg | ORAL_TABLET | ORAL | Status: DC | PRN

## 2015-07-02 MED ORDER — FLUDROCORTISONE ACETATE 0.1 MG PO TABS
0.1000 mg | ORAL_TABLET | Freq: Two times a day (BID) | ORAL | Status: DC
  Filled 2015-07-02: qty 1

## 2015-07-02 MED ORDER — SODIUM CHLORIDE 0.9 % IV BOLUS (SEPSIS)
500.0000 mL | Freq: Once | INTRAVENOUS | Status: AC
  Administered 2015-07-02: 500 mL via INTRAVENOUS

## 2015-07-02 MED ORDER — HYDROCODONE-ACETAMINOPHEN 5-325 MG PO TABS: 1.0000 | ORAL_TABLET | Freq: Four times a day (QID) | ORAL | Status: DC | PRN

## 2015-07-02 MED ORDER — MIDODRINE HCL 5 MG PO TABS
2.5000 mg | ORAL_TABLET | Freq: Three times a day (TID) | ORAL | Status: DC
Start: 2015-07-02 — End: 2015-07-02

## 2015-07-02 MED ORDER — LORATADINE 10 MG PO TABS: 10.0000 mg | ORAL_TABLET | Freq: Every day | ORAL | Status: DC

## 2015-07-02 MED ORDER — SERTRALINE HCL 50 MG PO TABS: 50.0000 mg | ORAL_TABLET | Freq: Every day | ORAL | Status: DC

## 2015-07-02 MED ORDER — SENNA 8.6 MG PO TABS: 1.0000 | ORAL_TABLET | Freq: Two times a day (BID) | ORAL | Status: DC

## 2015-07-02 MED ORDER — ENSURE ENLIVE PO LIQD: 237.0000 mL | Freq: Two times a day (BID) | ORAL | Status: DC

## 2015-07-02 MED ORDER — SODIUM CHLORIDE 0.9 % IV SOLN
3.0000 g | Freq: Three times a day (TID) | INTRAVENOUS | Status: DC
  Filled 2015-07-02 (×2): qty 3

## 2015-07-02 MED ORDER — ACETAMINOPHEN 325 MG PO TABS
325.0000 mg | ORAL_TABLET | Freq: Four times a day (QID) | ORAL | Status: DC | PRN
  Administered 2015-07-02: 325 mg via ORAL
  Filled 2015-07-02: qty 1

## 2015-07-02 MED ORDER — AMOXICILLIN-POT CLAVULANATE 875-125 MG PO TABS: 1.0000 | ORAL_TABLET | Freq: Two times a day (BID) | ORAL | Status: DC

## 2015-07-02 MED ORDER — METHYLPREDNISOLONE 4 MG PO TBPK: ORAL_TABLET | ORAL | Status: DC

## 2015-07-02 MED ORDER — RIVAROXABAN 20 MG PO TABS: 20.0000 mg | ORAL_TABLET | Freq: Every day | ORAL | Status: DC

## 2015-07-02 MED ORDER — PROMETHAZINE HCL 25 MG PO TABS: 12.5000 mg | ORAL_TABLET | Freq: Four times a day (QID) | ORAL | Status: DC | PRN

## 2015-07-02 MED ORDER — ONDANSETRON HCL 4 MG/2ML IJ SOLN: 4.0000 mg | Freq: Four times a day (QID) | INTRAMUSCULAR | Status: DC | PRN

## 2015-07-02 MED ORDER — TIZANIDINE HCL 4 MG PO TABS: 4.0000 mg | ORAL_TABLET | Freq: Two times a day (BID) | ORAL | Status: DC | PRN

## 2015-07-02 MED ORDER — MELATONIN 3 MG PO TABS
9.0000 mg | ORAL_TABLET | Freq: Every evening | ORAL | Status: DC | PRN
  Administered 2015-07-02: 9 mg via ORAL
  Filled 2015-07-02 (×2): qty 3

## 2015-07-02 MED ORDER — SODIUM CHLORIDE 0.9 % IV SOLN
3.0000 g | Freq: Once | INTRAVENOUS | Status: DC
  Filled 2015-07-02: qty 3

## 2015-07-02 MED ORDER — HYDROCORTISONE NA SUCCINATE PF 100 MG IJ SOLR
100.0000 mg | Freq: Once | INTRAMUSCULAR | Status: AC
  Administered 2015-07-02: 100 mg via INTRAVENOUS
  Filled 2015-07-02: qty 2

## 2015-07-02 MED ORDER — SODIUM CHLORIDE 0.9 % IV SOLN
INTRAVENOUS | Status: DC
  Administered 2015-07-02: 04:00:00 via INTRAVENOUS

## 2015-07-02 MED ORDER — VANCOMYCIN HCL 10 G IV SOLR
1250.0000 mg | Freq: Two times a day (BID) | INTRAVENOUS | Status: DC
  Filled 2015-07-02 (×4): qty 1250

## 2015-07-02 MED ORDER — ONDANSETRON HCL 4 MG PO TABS: 4.0000 mg | ORAL_TABLET | Freq: Four times a day (QID) | ORAL | Status: DC | PRN

## 2015-07-02 MED ORDER — ALPRAZOLAM 0.25 MG PO TABS: 0.1250 mg | ORAL_TABLET | Freq: Three times a day (TID) | ORAL | Status: DC | PRN

## 2015-07-02 MED ORDER — PANTOPRAZOLE SODIUM 40 MG PO TBEC: 40.0000 mg | DELAYED_RELEASE_TABLET | Freq: Two times a day (BID) | ORAL | Status: DC

## 2015-07-02 MED ORDER — FLUTICASONE PROPIONATE 50 MCG/ACT NA SUSP
2.0000 | Freq: Every evening | NASAL | Status: DC | PRN
  Administered 2015-07-02: 2 via NASAL
  Filled 2015-07-02: qty 16

## 2015-07-02 MED ORDER — POTASSIUM CHLORIDE ER 10 MEQ PO TBCR
40.0000 meq | EXTENDED_RELEASE_TABLET | Freq: Every day | ORAL | Status: DC
  Filled 2015-07-02: qty 4

## 2015-07-02 MED ORDER — SENNOSIDES-DOCUSATE SODIUM 8.6-50 MG PO TABS: 1.0000 | ORAL_TABLET | Freq: Two times a day (BID) | ORAL | Status: DC

## 2015-07-02 NOTE — ED Notes (Signed)
Pt does not want yellow fall socks on at this time.

## 2015-07-02 NOTE — ED Provider Notes (Signed)
CSN: [id number]     Arrival date & time 07/02/15  2117 History   First MD Initiated Contact with Patient 07/02/15 2231     Chief Complaint  Patient presents with  . Fever  . Nausea  . Cough     (Consider location/radiation/quality/duration/timing/severity/associated sxs/prior Treatment) HPI Comments: 65yo male with a history of coronary artery disease, hypertension, hyperlipidemia, diverticulosis, and 30, pulmonary embolus on xarelto, CVA with residual left-sided hemiplegia presents with concern for fever, nausea, abdominal pain, cough and 1 out of 2 blood cultures being positive on admission yesterday. Patient was evaluated yesterday for sore throat, cough, fever, and had a CT of the neck which are tonsillitis and suppurative adenitis, and he was admitted to the internal medicine team. He was subsequently discharged, and after discharge one of his blood cultures returned positive.  Physician called the patient regarding his blood cultures, with instructions that if he develops fevers he should return to the emergency department. Patient developed a fever up to 101.1 today so he return to the emergency department.  Reports that the cough is now worse, nasal congestion is worse, that he has severe abdominal pain with coughing and movement. The abdominal pain is suprapubic. Denies any urinary changes. He is taking Xarelto.   Past Medical History  Diagnosis Date  . Coronary artery disease     stent, then CABG 07/20/10  . Hypertension   . Hyperlipidemia   . Obesity (BMI 30-39.9)   . H/O cardiomyopathy     ischemic, now last echo 07/30/11, EF 38%W  . Diverticulosis   . Internal hemorrhoids   . Tubular adenoma of colon   . Plantar fasciitis of left foot   . Basal cell carcinoma of skin of left ankle   . NSTEMI (non-ST elevated myocardial infarction) ([place])     NSTEMI- last cath 06/2011-Stent to LCX-DES, last nuc 07/30/11 low risk  . MI (myocardial infarction) ([place])     "I had 4 before I  retired in 2006" (11/11/2014)  . Pulmonary embolism ([place]) 03/2014  . GERD (gastroesophageal reflux disease)   . Bleeding stomach ulcer 2015  . Daily headache     "for the past month" (11/11/2014)  . Stroke ([place]) 01/2014    "no control of LUE, can slightly LLE; memory problems since" (11/11/2014)  . Arthritis     "back" (11/11/2014)  . Gout   . Depression   . Pneumonia     hosp. 12/2014  . Anxiety   . History of hiatal hernia   . 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 OSA on CPAP     since July 2013- uses CPAP sometimes , states he has lost 147 lbs. since stroke & doesn't use the CPAP as much as he use to.   . Adrenal insufficiency ([place])   . Left hemiplegia [facility])    Past Surgical History  Procedure Laterality Date  . Coronary artery bypass graft  07/20/2010     LIMA-LAD; VG-ACUTE MARG of RCA; Seq VG-distal RCA & then pda  . Coronary angioplasty with stent placement  07/01/2011    DES-Resolute to native LCX  . Coronary angioplasty with stent placement  12/01/2007    COMPLEX 5 LESION PCI INCLUDING CUTTING BALLOON AND 4 CYPHER DESs  . Knee arthroscopy Right   . Hand surgery Right     to take glass out  . Colonoscopy N/A 02/10/2013    Procedure: COLONOSCOPY;  Surgeon: Ladene Artist, MD;  Location: [facility] ENDOSCOPY;  Service: Endoscopy;  Laterality: N/A;  . Retinal detachment surgery Right   . Loop recorder implant  02/15/2014    MDT LINQ implanted by Dr Rayann Heman for cryptogenic stroke  . Tee without cardioversion N/A 02/15/2014    Procedure: TRANSESOPHAGEAL ECHOCARDIOGRAM (TEE);  Surgeon: Josue Hector, MD;  Location[facility] ENDOSCOPY;  Service: Cardiovascular;  Laterality: N/A;  . Left heart catheterization with coronary/graft angiogram  07/01/2011    Procedure: LEFT HEART CATHETERIZATION WITH Beatrix Fetters;  Surgeon: Sanda Klein, MD;  Location: [place] CATH LAB;  Service: Cardiovascular;;  . Percutaneous coronary stent intervention (pci-s) Right 07/01/2011     Procedure: PERCUTANEOUS CORONARY STENT INTERVENTION (PCI-S);  Surgeon: Sanda Klein, MD;  Location: [facility] CATH LAB;  Service: Cardiovascular;  Laterality: Right;  . Umbilical hernia repair  2006  . Hernia repair    . Basal cell carcinoma excision Left 2015    ankle  . Esophagogastroduodenoscopy  01/2014    gastric ulcer, erosive gastroduodenitis, H Pylori negative.   . Esophagogastroduodenoscopy (egd) with propofol N/A 11/14/2014    Procedure: ESOPHAGOGASTRODUODENOSCOPY (EGD) WITH PROPOFOL;  Surgeon: Milus Banister, MD;  Location: [place];  Service: Endoscopy;  Laterality: N/A;  . Intramedullary (im) nail intertrochanteric Right 02/07/2015    Procedure: INTRAMEDULLARY (IM) NAIL INTERTROCHANTRIC RIGHT HIP;  Surgeon: Renette Butters, MD;  Location: [place];  Service: Orthopedics;  Laterality: Right;  . Cardiac surgery     Family History  Problem Relation Age of Onset  . Diabetes type II Mother   . Coronary artery disease    . Colon cancer Neg Hx   . CAD Father 15    CABG, PCI, died at 54  . Heart attack Father 62  . Hypertension Brother   . Diabetes Brother   . Hyperlipidemia Brother   . Brain cancer Maternal Grandmother   . COPD Paternal Grandfather   . Other      denies family history of DVT/PE  . Stroke Neg Hx   . Other Neg Hx     adrenal dz   Social History  Substance Use Topics  . Smoking status: Former Smoker -- 2.00 packs/day for 4 years    Types: Cigarettes    Quit date: 01/30/1969  . Smokeless tobacco: Never Used     Comment: "quit smoking cigarettes  in the 1970's"  . Alcohol Use: No    Review of Systems  Constitutional: Positive for fever and appetite change.  HENT: Positive for congestion. Negative for sore throat.   Eyes: Negative for visual disturbance.  Respiratory: Positive for cough. Negative for shortness of breath.   Cardiovascular: Negative for chest pain.  Gastrointestinal: Positive for nausea and abdominal pain. Negative for vomiting, diarrhea  and constipation.  Genitourinary: Negative for difficulty urinating.  Musculoskeletal: Negative for back pain and neck stiffness.  Skin: Negative for rash.  Neurological: Negative for syncope and headaches.      Allergies  Diphenhydramine hcl and Adhesive  Home Medications   Prior to Admission medications   Medication Sig Start Date End Date Taking? Authorizing Provider  acetaminophen (TYLENOL) 325 MG tablet Take 1 tablet (325 mg total) by mouth every 6 (six) hours as needed for mild pain. 12/27/14  Yes Florencia Reasons, MD  ALPRAZolam Duanne Moron) 0.25 MG tablet Take 0.5 tablets (0.125 mg total) by mouth every 8 (eight) hours as needed for anxiety. 04/14/14  Yes Tiffany L Reed, DO  aspirin EC 81 MG tablet Take 81 mg by mouth daily.  Yes Historical Provider, MD  colchicine 0.6 MG tablet Take 0.6 mg by mouth as needed (for gout). Reported on 05/26/2015 04/09/14  Yes Historical Provider, MD  Fexofenadine HCl (ALLEGRA PO) Take 1 tablet by mouth daily as needed (seasonal allergies).   Yes Historical Provider, MD  fludrocortisone (FLORINEF) 0.1 MG tablet Take 1 tablet (0.1 mg total) by mouth 2 (two) times daily. 05/26/15  Yes Renato Shin, MD  fluticasone (FLONASE) 50 MCG/ACT nasal spray Place 2 sprays into both nostrils at bedtime as needed (seasonal allergies).    Yes Historical Provider, MD  HYDROcodone-acetaminophen (NORCO) 5-325 MG tablet Take 1-2 tablets by mouth every 6 (six) hours as needed for moderate pain. 05/15/15  Yes Britt Bottom, MD  hydrocortisone (ANUSOL-HC) 25 MG suppository Place 25 mg rectally as needed for hemorrhoids or itching.   Yes Historical Provider, MD  Melatonin 10 MG CAPS Take 10 mg by mouth at bedtime as needed (sleep).    Yes Historical Provider, MD  midodrine (PROAMATINE) 2.5 MG tablet Take 1 tablet (2.5 mg total) by mouth 3 (three) times daily with meals. 05/10/15  Yes Renato Shin, MD  nitroGLYCERIN (NITROSTAT) 0.4 MG SL tablet Place 1 tablet (0.4 mg total) under the tongue  every 5 (five) minutes as needed. For chest pain Patient taking differently: Place 0.4 mg under the tongue every 5 (five) minutes as needed for chest pain.  05/21/15  Yes Tonia Ghent, MD  pantoprazole (PROTONIX) 40 MG tablet Take 1 tablet (40 mg total) by mouth 2 (two) times daily. 04/05/15  Yes [name], MD  potassium chloride (K-DUR) 10 MEQ tablet TAKE 4 TABLETS (40 MEQ TOTAL) BY MOUTH DAILY. Patient taking differently: Take 40 mEq by mouth daily. TAKE 4 TABLETS (40 MEQ TOTAL) BY MOUTH DAILY 03/21/15  Yes Tonia Ghent, MD  PRESCRIPTION MEDICATION Inhale into the lungs at bedtime. CPAP   Yes Historical Provider, MD  rivaroxaban (XARELTO) 20 MG TABS tablet Take 1 tablet (20 mg total) by mouth daily with supper. 06/01/15  Yes Britt Bottom, MD  rosuvastatin (CRESTOR) 40 MG tablet Take 1 tablet (40 mg total) by mouth daily. Patient taking differently: Take 40 mg by mouth at bedtime.  06/30/15  Yes [name], MD  senna (SENOKOT) 8.6 MG TABS tablet Take 1 tablet by mouth 2 (two) times daily.   Yes Historical Provider, MD  senna-docusate (SENNA S) 8.6-50 MG tablet Take 1 tablet by mouth 2 (two) times daily.   Yes Historical Provider, MD  sertraline (ZOLOFT) 50 MG tablet Take 1 tablet (50 mg total) by mouth daily. 01/09/15  Yes Britt Bottom, MD  tiZANidine (ZANAFLEX) 4 MG tablet Take 4 mg by mouth 2 (two) times daily as needed for muscle spasms.  06/21/15  Yes Historical Provider, MD  amoxicillin-clavulanate (AUGMENTIN) 875-125 MG tablet Take 1 tablet by mouth every 12 (twelve) hours. Patient taking differently: Take 1 tablet by mouth every 12 (twelve) hours. 7 day course prescribed 07/02/15 07/02/15   Thurnell Lose, MD  methylPREDNISolone (MEDROL DOSEPAK) 4 MG TBPK tablet follow package directions 07/02/15   Thurnell Lose, MD   BP 133/73 mmHg  Pulse 89  Temp(Src) 98.8 F (37.1 C) (Oral)  Resp 16  SpO2 93% Physical Exam  Constitutional: He is oriented to person, place, and time. He  appears well-developed and well-nourished. No distress.  HENT:  Head: Normocephalic and atraumatic.  Mouth/Throat: Oropharynx is clear and moist. No oropharyngeal exudate (mild erythema).  Eyes: Conjunctivae  and EOM are normal. Pupils are equal, round, and reactive to light.  Neck: Normal range of motion.  Cardiovascular: Normal rate, regular rhythm, normal heart sounds and intact distal pulses.  Exam reveals no gallop and no friction rub.   No murmur heard. Pulmonary/Chest: Effort normal and breath sounds normal. No respiratory distress. He has no wheezes. He has no rales.  Abdominal: Soft. He exhibits no distension. There is tenderness (suprapubic) in the suprapubic area and left lower quadrant. There is guarding. No hernia. Hernia confirmed negative in the ventral area, confirmed negative in the right inguinal area and confirmed negative in the left inguinal area.  Musculoskeletal: He exhibits no edema.  Neurological: He is alert and oriented to person, place, and time.  Weakness left side baseline  Skin: Skin is warm and dry. He is not diaphoretic.  Nursing note and vitals reviewed.   ED Course  Procedures (including critical care time) Labs Review Labs Reviewed  CBC WITH DIFFERENTIAL/PLATELET - Abnormal; Notable for the following:    RBC 3.99 (*)    Hemoglobin 9.8 (*)    HCT 31.9 (*)    MCH 24.6 (*)    Platelets 137 (*)    All other components within normal limits  COMPREHENSIVE METABOLIC PANEL - Abnormal; Notable for the following:    Potassium 3.1 (*)    CO2 20 (*)    Glucose, Bld 102 (*)    Calcium 8.6 (*)    Total Protein 6.4 (*)    Albumin 3.1 (*)    ALT 14 (*)    All other components within normal limits  PROTIME-INR - Abnormal; Notable for the following:    Prothrombin Time 15.3 (*)    All other components within normal limits  CULTURE, BLOOD (ROUTINE X 2)  CULTURE, BLOOD (ROUTINE X 2)  LACTIC ACID, PLASMA  I-STAT CG4 LACTIC ACID, ED  TYPE AND SCREEN     Imaging Review Dg Chest 2 View  07/01/2015  CLINICAL DATA:  Cough and fever EXAM: CHEST  2 VIEW COMPARISON:  04/07/2014 FINDINGS: Cardiomegaly with negative aortic and hilar contours. Status post CABG. Low volume chest with interstitial crowding. There is no edema, consolidation, effusion, or pneumothorax. Implantable loop recorder. 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 Patient has previous cervical spine CT imaging in 2016. IMPRESSION: Low volume chest without acute finding. Electronically Signed   By: Monte Fantasia M.D.   On: 07/01/2015 20:46   Ct Soft Tissue Neck W Contrast  07/02/2015  CLINICAL DATA:  Initial evaluation for acute left-sided neck swelling with sore throat. EXAM: CT NECK WITH CONTRAST TECHNIQUE: Multidetector CT imaging of the neck was performed using the standard protocol following the bolus administration of intravenous contrast. CONTRAST:  1 ISOVUE-300 IOPAMIDOL (ISOVUE-300) INJECTION 61% COMPARISON:  None. FINDINGS: Visualized portions of the brain demonstrate no acute process. Encephalomalacia noted within the anterior right temporal pole. Partially visualized globes and orbits demonstrate no acute abnormality. Scattered mucosal thickening within the max O sinuses and ethmoidal air cells. Fluid level within the right sphenoid sinus. Mucosal thickening within the left sphenoid sinus. Mastoid air cells are clear. Middle ear cavities are clear. Salivary glands including the parotid glands and submandibular glands are normal. Oral cavity is unremarkable, although evaluation somewhat limited by streak artifact from dental amalgam. No acute abnormality about the dentition. Palatine tonsils are mildly prominent and hyper enhancing, which may reflect acute tonsillitis. No peritonsillar abscess or other collection. Epiglottis normal. Vallecula clear. No retropharyngeal fluid collection  or edema. Remainder of the hypopharynx and supraglottic larynx within  normal limits without acute inflammatory changes. True cords grossly normal. Subglottic airway clear. Approximately 2 cm hypodense nodule with internal calcification present within the inferior left thyroid lobe, indeterminate. Thyroid otherwise within normal limits. Enlarged left level 2 lymph nodes measure up to 13 mm. A few of these nodes are somewhat heterogeneous and partially hypodense in appearance. Increased number of left level 2 P and level 5 nodes as compared to the right. These measure up to 15 mm. Increased number of subcentimeter left level 2/3 nodes as well. There is associated inflammatory stranding within the adjacent fat within the is area of adenopathy in the left neck. Finding is indeterminate, and could reflect supper node adenitis or reactive adenopathy. Nodal malignancy could also have this appearance. No significant adenopathy seen within the right neck. Visualized superior mediastinum within normal limits. Visualized lungs are clear. Normal intravascular enhancement seen throughout the neck. No acute osseous abnormality. No worrisome lytic or blastic osseous lesions. Prominent bridging anterior osteophytes seen throughout the cervical spine. IMPRESSION: 1. Left-sided cervical adenopathy with associated inflammatory stranding as detailed above, indeterminate, but may reflect sequela of suppurative adenitis. Alternatively, these nodes may be reactive in nature. Nodal malignancy could also have this appearance. Clinical followup to resolution is recommended. 2. Question mild enlargement and hyper enhancement of the palatine tonsils, which may reflect acute tonsillitis. Correlation with physical exam and direct visualization recommended. 3. Approximately 2 cm left thyroid nodule, indeterminate. Correlation with dedicated thyroid ultrasound recommended. This could be performed on a nonemergent basis. 4. Prominent anterior bridging osteophytic spurring throughout the cervical spine. 5. Acute on  chronic paranasal sinus disease as above. Electronically Signed   By: Jeannine Boga M.D.   On: 07/02/2015 00:13   Ct Abdomen Pelvis W Contrast  07/03/2015  CLINICAL DATA:  Suprapubic abdominal pain since this morning. Nausea. Fever. Cough. EXAM: CT ABDOMEN AND PELVIS WITH CONTRAST TECHNIQUE: Multidetector CT imaging of the abdomen and pelvis was performed using the standard protocol following bolus administration of intravenous contrast. CONTRAST:  147mL ISOVUE-300 IOPAMIDOL (ISOVUE-300) INJECTION 61% COMPARISON:  12/23/2014 FINDINGS: Motion artifact limits evaluation of lung bases. Postoperative changes in the mediastinum. Mild diffuse fatty infiltration of the liver. Scattered calcified granulomas in the liver and spleen. Fatty infiltration of the pancreas. Small cysts in the kidneys. No hydronephrosis. Nephrograms are symmetrical. Calcification of aorta without aneurysm. No adrenal gland nodules. Normal caliber IVC. No retroperitoneal lymphadenopathy. Stomach, small bowel, and colon are not abnormally distended. No free air or free fluid in the abdomen. Pelvis: Appendix is normal. Prostate gland is enlarged, measuring 5.8 cm diameter. The mid and inferior portions of the rectus abdominus muscles bilaterally are expanded with infiltration in the fat around the rectus abdominus muscles and in the anterior low pelvis. This suggest rectus muscle hematomas, possibly due to muscle injury or tear. There is pooling of contrast material within the rectus abdominus muscles, more on the left. This may indicate active contrast extravasation. Bladder wall is not thickened. Postoperative changes in the right hip with intra medullary rod and compression bolt fixation. There is lucency around the visualized compression bolt and rod. This could indicate loosening of hardware. Degenerative changes in the spine and hips. Bilateral inguinal hernias containing fat. IMPRESSION: Bilateral inferior rectus abdominus muscle  hematomas with soft tissue hematoma in the surrounding fat and anterior pelvic wall. Puddling of contrast material in the inferior rectus muscles bilaterally but greater on the left may indicate  active extravasation. Additional incidental findings as discussed in the body of report. These results will be called to the ordering clinician or representative by the Radiologist Assistant, and communication documented in the PACS or zVision Dashboard. Electronically Signed   By: Lucienne Capers M.D.   On: 07/03/2015 01:18   I have personally reviewed and evaluated these images and lab results as part of my medical decision-making.   EKG Interpretation None      MDM   Final diagnoses:  Bacteremia due to Gram-positive bacteria  Rectus sheath hematoma, initial encounter  Fever, unspecified fever cause  Lower abdominal pain  Tonsillitis   65yo male with a history of coronary artery disease, hypertension, hyperlipidemia, diverticulosis, and 69, pulmonary embolus on xarelto, CVA with residual left-sided hemiplegia presents with concern for fever, nausea, abdominal pain, cough and 1 out of 2 blood cultures being positive on admission yesterday. Patient was evaluated yesterday for sore throat, cough, fever, and had a CT of the neck which are tonsillitis and suppurative adenitis, and he was admitted to the internal medicine team. He was subsequently discharged, and after discharge one of his blood cultures returned positive. This may be contamination, however given continuing fevers, patient return to the emergency department. Blood cultures were ordered, and vancomycin was ordered. Patient reports new abdominal pain beginning today and has significant suprapubic tenderness. CT abdomen pelvis is ordered to evaluate for diverticulitis or possible hematoma with patient being on Xarelto. CT showed hematoma of inferior rectus muscle with active extravasation. Abdominal binder ordered to provide pressure.     Patient to be admitted to the internal medicine team for further care of possible bacteremia. Discussed hematoma with active extravasation of the inferior rectus muscle with Dr. Rosendo Gros surgery, who reports no further intervention is necessary beyond the abdominal binder.  Gareth Morgan, MD 07/03/15 [phone]

## 2015-07-02 NOTE — ED Notes (Signed)
Admitting at bedside 

## 2015-07-02 NOTE — ED Notes (Signed)
Pt. reports fever , nausea , fatigue and occasional productive cough this evening , pt. was admitted yesterday and discharged home this afternoon .

## 2015-07-02 NOTE — ED Notes (Signed)
Pt and wife report that pt discharged this morning for tonsillitis.  Was called at home and lab report came back questionable and if starts to run fever return to ED.  This afternoon pt had temperature 101.1.  Onset today pt c/o mid lower abd pain when coughing.  Last BM today was normal.

## 2015-07-02 NOTE — Discharge Summary (Addendum)
Gregory Wiley, is a 65 y.o. male  DOB 1950-11-03  MRN [id number].  Admission date:  07/01/2015  Admitting Physician  Norval Morton, MD  Discharge Date:  07/02/2015   Primary MD  [name], MD  Recommendations for primary care physician for things to follow:   Needs thyroid ultrasound in one to 2 weeks.  Outpatient follow-up with ENT in 1-2 weeks.  Repeat CBC, BMP in a week. Please follow final culture results which are pending.  Please follow final blood culture results   Admission Diagnosis  Neck swelling [R22.1]   Discharge Diagnosis  Neck swelling [R22.1]     Principal Problem:   Acute tonsillitis Active Problems:   Hemiplegia affecting left dominant side (HCC)   Bilateral pulmonary embolism (HCC)   Hypotension      Past Medical History  Diagnosis Date  . Coronary artery disease     stent, then CABG 07/20/10  . Hypertension   . Hyperlipidemia   . Obesity (BMI 30-39.9)   . H/O cardiomyopathy     ischemic, now last echo 07/30/11, EF 38%W  . Diverticulosis   . Internal hemorrhoids   . Tubular adenoma of colon   . Plantar fasciitis of left foot   . Basal cell carcinoma of skin of left ankle   . NSTEMI (non-ST elevated myocardial infarction) ([place])     NSTEMI- last cath 06/2011-Stent to LCX-DES, last nuc 07/30/11 low risk  . MI (myocardial infarction) ([place])     "I had 4 before I retired in 2006" (11/11/2014)  . Pulmonary embolism ([place]) 03/2014  . GERD (gastroesophageal reflux disease)   . Bleeding stomach ulcer 2015  . Daily headache     "for the past month" (11/11/2014)  . Stroke ([place]) 01/2014    "no control of LUE, can slightly LLE; memory problems since" (11/11/2014)  . Arthritis     "back" (11/11/2014)  . Gout   . Depression   . Pneumonia     hosp. 12/2014  . Anxiety   . History of  hiatal hernia   . 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 OSA on CPAP     since July 2013- uses CPAP sometimes , states he has lost 147 lbs. since stroke & doesn't use the CPAP as much as he use to.   . Adrenal insufficiency ([place])   . Left hemiplegia [facility])     Past Surgical History  Procedure Laterality Date  . Coronary artery bypass graft  07/20/2010     LIMA-LAD; VG-ACUTE MARG of RCA; Seq VG-distal RCA & then pda  . Coronary angioplasty with stent placement  07/01/2011    DES-Resolute to native LCX  . Coronary angioplasty with stent placement  12/01/2007    COMPLEX 5 LESION PCI INCLUDING CUTTING BALLOON AND 4 CYPHER DESs  . Knee arthroscopy Right   . Hand surgery Right     to take glass out  . Colonoscopy N/A 02/10/2013    Procedure: COLONOSCOPY;  Surgeon: Ladene Artist, MD;  Location: [facility];  Service: Endoscopy;  Laterality: N/A;  . Retinal detachment surgery Right   . Loop recorder implant  02/15/2014    MDT LINQ implanted by Dr Rayann Heman for cryptogenic stroke  . Tee without cardioversion N/A 02/15/2014    Procedure: TRANSESOPHAGEAL ECHOCARDIOGRAM (TEE);  Surgeon: Josue Hector, MD;  Location: [facility] ENDOSCOPY;  Service: Cardiovascular;  Laterality: N/A;  . Left heart catheterization with coronary/graft angiogram  07/01/2011    Procedure: LEFT HEART CATHETERIZATION WITH Beatrix Fetters;  Surgeon: Sanda Klein, MD;  Location: [facility] CATH LAB;  Service: Cardiovascular;;  . Percutaneous coronary stent intervention (pci-s) Right 07/01/2011    Procedure: PERCUTANEOUS CORONARY STENT INTERVENTION (PCI-S);  Surgeon: Sanda Klein, MD;  Location: [facility] CATH LAB;  Service: Cardiovascular;  Laterality: Right;  . Umbilical hernia repair  2006  . Hernia repair    . Basal cell carcinoma excision Left 2015    ankle  . Esophagogastroduodenoscopy  01/2014    gastric ulcer, erosive gastroduodenitis, H Pylori negative.   . Esophagogastroduodenoscopy (egd) with  propofol N/A 11/14/2014    Procedure: ESOPHAGOGASTRODUODENOSCOPY (EGD) WITH PROPOFOL;  Surgeon: Milus Banister, MD;  Location: [facility];  Service: Endoscopy;  Laterality: N/A;  . Intramedullary (im) nail intertrochanteric Right 02/07/2015    Procedure: INTRAMEDULLARY (IM) NAIL INTERTROCHANTRIC RIGHT HIP;  Surgeon: Renette Butters, MD;  Location: [facility] [place];  Service: Orthopedics;  Laterality: Right;  . Cardiac surgery         HPI  from the history and physical done on the day of admission:    Mr. Verhagen is a 65 year old male with past medical history significant for CAD, HTN, HLD, chronic adrenal insufficiency, PE on Xarelto, CVA with residual of sided hemiparesis, orthostatic hypotension, OSA on CPAP; who presents with complaints of fever and sore throat. Symptoms started 2 nights ago, he reports having fever up to 100.42F and a new sore throat. Reports a constant achy like feeling on the side of his neck worsened by trying to eat or swallowing. Associated symptoms include decreased appetite and nonproductive cough that is have the last few days. From waking up yesterday morning he noted gradual worsening in the swelling in the left side of his neck. Tried taking Tylenol for the fever and Mucinex for the cough. He initially went to be evaluated at urgent care today but due to significant tachycardia and hypotension was transferred to the emergency department for further evaluation.  Upon admission into the emergency room the patient was seen to be significantly orthostatic. CT scan of the soft tissues of neck revealed signs of cervical adenopathy with possible tonsillitis .      Hospital Course:     1.Possible acute tonsillitis with reactive cervical submandibular lymphadenopathy left greater than right - much improved after 12 hours of Unasyn, will be placed on Augmentin for 7 more days, he already feels a whole lot better, we'll request PCP to have patient follow with ENT one time in 1-2  weeks till his lymphadenopathy and left-sided neck swelling has resolved. Patient today feels back to his baseline and is eager to go home.  2. History of adrenal insufficiency with hypotension in the ER. Resolved with IV fluids, he is already on midodrine and Florinef which will be continued, he received 1 dose of IV hydrocortisone with good effect, we'll place him on Medrol Dosepak for 7 days.  3. History of bilateral PE. Continue anticoagulation with xaralto as before.  4. OSA. CPAP at  night continue.  5. History of CVA with residual left-sided hemiparesis. At baseline.   Addendum - few hours after patient was discharged I got a phone call from the micro-lab that 1/2 blood cultures growing gram-positive cocci in cultures, likely contamination with coag-negative staph. Left my cell phone number with the micro-lab tech to call me personally once the cultures have finalized. Also called patient at home on [phone]. He is feeling fine. Advised him that if he runs a fever to come to the ER. Also told him to follow with PCP in 2 days for final blood culture results.    Follow UP  Follow-up Information    Follow up with [name], MD. Schedule an appointment as soon as possible for a visit in 3 days.   Specialty:  Family Medicine   Contact information:   [place] [address] [phone]       Follow up with Melony Overly, MD. Schedule an appointment as soon as possible for a visit in 1 week.   Specialty:  Otolaryngology   Contact information:   [place] [address] [phone]        Consults obtained - None  Discharge Condition: Stable  Diet and Activity recommendation: See Discharge Instructions below  Discharge Instructions       Discharge Instructions    Diet - low sodium heart healthy    Complete by:  As directed      Discharge instructions    Complete by:  As directed   Follow with Primary MD [name], MD in 3 days   Get CBC, CMP, 2 view Chest X ray checked  by Primary MD next visit.    Activity: As tolerated with Full fall precautions use walker/cane & assistance as needed   Disposition Home     Diet:   Heart Healthy   For Heart failure patients - Check your Weight same time everyday, if you gain over 2 pounds, or you develop in leg swelling, experience more shortness of breath or chest pain, call your Primary MD immediately. Follow Cardiac Low Salt Diet and 1.5 lit/day fluid restriction.   On your next visit with your primary care physician please Get Medicines reviewed and adjusted.   Please request your Prim.MD to go over all Hospital Tests and Procedure/Radiological results at the follow up, please get all Hospital records sent to your Prim MD by signing hospital release before you go home.   If you experience worsening of your admission symptoms, develop shortness of breath, life threatening emergency, suicidal or homicidal thoughts you must seek medical attention immediately by calling 911 or calling your MD immediately  if symptoms less severe.  You Must read complete instructions/literature along with all the possible adverse reactions/side effects for all the Medicines you take and that have been prescribed to you. Take any new Medicines after you have completely understood and accpet all the possible adverse reactions/side effects.   Do not drive, operating heavy machinery, perform activities at heights, swimming or participation in water activities or provide baby sitting services if your were admitted for syncope or siezures until you have seen by Primary MD or a Neurologist and advised to do so again.  Do not drive when taking Pain medications.    Do not take more than prescribed Pain, Sleep and Anxiety Medications  Special Instructions: If you have smoked or chewed Tobacco  in the last 2 yrs please stop smoking, stop any regular  Alcohol  and or any  Recreational drug use.  Wear Seat belts while driving.   Please note  You were cared for by a hospitalist during your hospital stay. If you have any questions about your discharge medications or the care you received while you were in the hospital after you are discharged, you can call the unit and asked to speak with the hospitalist on call if the hospitalist that took care of you is not available. Once you are discharged, your primary care physician will handle any further medical issues. Please note that NO REFILLS for any discharge medications will be authorized once you are discharged, as it is imperative that you return to your primary care physician (or establish a relationship with a primary care physician if you do not have one) for your aftercare needs so that they can reassess your need for medications and monitor your lab values.     Increase activity slowly    Complete by:  As directed              Discharge Medications       Medication List    TAKE these medications        acetaminophen 325 MG tablet  Commonly known as:  TYLENOL  Take 1 tablet (325 mg total) by mouth every 6 (six) hours as needed for mild pain.     ALLEGRA PO  Take 1 tablet by mouth daily as needed (seasonal allergies).     ALPRAZolam 0.25 MG tablet  Commonly known as:  XANAX  Take 0.5 tablets (0.125 mg total) by mouth every 8 (eight) hours as needed for anxiety.     amoxicillin-clavulanate 875-125 MG tablet  Commonly known as:  AUGMENTIN  Take 1 tablet by mouth every 12 (twelve) hours.     aspirin EC 81 MG tablet  Take 81 mg by mouth daily.     colchicine 0.6 MG tablet  Take 0.6 mg by mouth as needed (for gout). Reported on 05/26/2015     fludrocortisone 0.1 MG tablet  Commonly known as:  FLORINEF  Take 1 tablet (0.1 mg total) by mouth 2 (two) times daily.     fluticasone 50 MCG/ACT nasal spray  Commonly known as:  FLONASE  Place 2 sprays into both nostrils at bedtime as needed  (seasonal allergies).     HYDROcodone-acetaminophen 5-325 MG tablet  Commonly known as:  NORCO  Take 1-2 tablets by mouth every 6 (six) hours as needed for moderate pain.     hydrocortisone 25 MG suppository  Commonly known as:  ANUSOL-HC  Place 25 mg rectally as needed for hemorrhoids or itching.     Melatonin 10 MG Caps  Take 10 mg by mouth at bedtime as needed (sleep).     methylPREDNISolone 4 MG Tbpk tablet  Commonly known as:  MEDROL DOSEPAK  follow package directions     midodrine 2.5 MG tablet  Commonly known as:  PROAMATINE  Take 1 tablet (2.5 mg total) by mouth 3 (three) times daily with meals.     nitroGLYCERIN 0.4 MG SL tablet  Commonly known as:  NITROSTAT  Place 1 tablet (0.4 mg total) under the tongue every 5 (five) minutes as needed. For chest pain     OVER THE COUNTER MEDICATION  Inhale 1 application into the lungs at bedtime. C-PAP     pantoprazole 40 MG tablet  Commonly known as:  PROTONIX  Take 1 tablet (40 mg total) by mouth 2 (two) times daily.  potassium chloride 10 MEQ tablet  Commonly known as:  K-DUR  TAKE 4 TABLETS (40 MEQ TOTAL) BY MOUTH DAILY.     potassium chloride 10 MEQ tablet  Commonly known as:  K-DUR  TAKE 4 TABLETS BY MOUTH DAILY.     promethazine 12.5 MG tablet  Commonly known as:  PHENERGAN  Take 1-2 tablets (12.5-25 mg total) by mouth every 6 (six) hours as needed for nausea or vomiting.     rivaroxaban 20 MG Tabs tablet  Commonly known as:  XARELTO  Take 1 tablet (20 mg total) by mouth daily with supper.     rosuvastatin 40 MG tablet  Commonly known as:  CRESTOR  Take 1 tablet (40 mg total) by mouth daily.     senna 8.6 MG Tabs tablet  Commonly known as:  SENOKOT  Take 1 tablet by mouth 2 (two) times daily.     SENNA S 8.6-50 MG tablet  Generic drug:  senna-docusate  Take 1 tablet by mouth 2 (two) times daily.     sertraline 50 MG tablet  Commonly known as:  ZOLOFT  Take 1 tablet (50 mg total) by mouth daily.       tiZANidine 4 MG tablet  Commonly known as:  ZANAFLEX  Take 4 mg by mouth 2 (two) times daily as needed for muscle spasms.        Major procedures and Radiology Reports - PLEASE review detailed and final reports for all details, in brief -       Dg Chest 2 View  07/01/2015  CLINICAL DATA:  Cough and fever EXAM: CHEST  2 VIEW COMPARISON:  04/07/2014 FINDINGS: Cardiomegaly with negative aortic and hilar contours. Status post CABG. Low volume chest with interstitial crowding. There is no edema, consolidation, effusion, or pneumothorax. Implantable loop recorder. 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 Patient has previous cervical spine CT imaging in 2016. IMPRESSION: Low volume chest without acute finding. Electronically Signed   By: Monte Fantasia M.D.   On: 07/01/2015 20:46   Ct Soft Tissue Neck W Contrast  07/02/2015  CLINICAL DATA:  Initial evaluation for acute left-sided neck swelling with sore throat. EXAM: CT NECK WITH CONTRAST TECHNIQUE: Multidetector CT imaging of the neck was performed using the standard protocol following the bolus administration of intravenous contrast. CONTRAST:  1 ISOVUE-300 IOPAMIDOL (ISOVUE-300) INJECTION 61% COMPARISON:  None. FINDINGS: Visualized portions of the brain demonstrate no acute process. Encephalomalacia noted within the anterior right temporal pole. Partially visualized globes and orbits demonstrate no acute abnormality. Scattered mucosal thickening within the max O sinuses and ethmoidal air cells. Fluid level within the right sphenoid sinus. Mucosal thickening within the left sphenoid sinus. Mastoid air cells are clear. Middle ear cavities are clear. Salivary glands including the parotid glands and submandibular glands are normal. Oral cavity is unremarkable, although evaluation somewhat limited by streak artifact from dental amalgam. No acute abnormality about the dentition. Palatine tonsils are mildly prominent and hyper  enhancing, which may reflect acute tonsillitis. No peritonsillar abscess or other collection. Epiglottis normal. Vallecula clear. No retropharyngeal fluid collection or edema. Remainder of the hypopharynx and supraglottic larynx within normal limits without acute inflammatory changes. True cords grossly normal. Subglottic airway clear. Approximately 2 cm hypodense nodule with internal calcification present within the inferior left thyroid lobe, indeterminate. Thyroid otherwise within normal limits. Enlarged left level 2 lymph nodes measure up to 13 mm. A few of these nodes are somewhat heterogeneous and partially hypodense in appearance.  Increased number of left level 2 P and level 5 nodes as compared to the right. These measure up to 15 mm. Increased number of subcentimeter left level 2/3 nodes as well. There is associated inflammatory stranding within the adjacent fat within the is area of adenopathy in the left neck. Finding is indeterminate, and could reflect supper node adenitis or reactive adenopathy. Nodal malignancy could also have this appearance. No significant adenopathy seen within the right neck. Visualized superior mediastinum within normal limits. Visualized lungs are clear. Normal intravascular enhancement seen throughout the neck. No acute osseous abnormality. No worrisome lytic or blastic osseous lesions. Prominent bridging anterior osteophytes seen throughout the cervical spine. IMPRESSION: 1. Left-sided cervical adenopathy with associated inflammatory stranding as detailed above, indeterminate, but may reflect sequela of suppurative adenitis. Alternatively, these nodes may be reactive in nature. Nodal malignancy could also have this appearance. Clinical followup to resolution is recommended. 2. Question mild enlargement and hyper enhancement of the palatine tonsils, which may reflect acute tonsillitis. Correlation with physical exam and direct visualization recommended. 3. Approximately 2 cm left  thyroid nodule, indeterminate. Correlation with dedicated thyroid ultrasound recommended. This could be performed on a nonemergent basis. 4. Prominent anterior bridging osteophytic spurring throughout the cervical spine. 5. Acute on chronic paranasal sinus disease as above. Electronically Signed   By: Jeannine Boga M.D.   On: 07/02/2015 00:13    Micro Results      No results found for this or any previous visit (from the past 240 hour(s)).     Today   Subjective    [name] today has no headache,no chest abdominal pain,no new weakness tingling or numbness, feels much better wants to go home today.     Objective   Blood pressure 133/76, pulse 82, temperature 98 F (36.7 C), temperature source Oral, resp. rate 18, height 5\' 10"  (1.778 m), weight 92.216 kg (203 lb 4.8 oz), SpO2 95 %.   Intake/Output Summary (Last 24 hours) at 07/02/15 0855 Last data filed at 07/02/15 0600  Gross per 24 hour  Intake    250 ml  Output      0 ml  Net    250 ml    Exam Awake Alert, Oriented x 3, No new F.N deficits, Normal affect [place].AT,PERRAL Supple Neck,No JVD, +ve Submandibular cervical adenopathy with some left-sided submandibular swelling.  Symmetrical Chest wall movement, Good air movement bilaterally, CTAB RRR,No Gallops,Rubs or new Murmurs, No Parasternal Heave +ve B.Sounds, Abd Soft, Non tender, No organomegaly appriciated, No rebound -guarding or rigidity. No Cyanosis, Clubbing or edema, No new Rash or bruise   Data Review   CBC w Diff: Lab Results  Component Value Date   WBC 3.9* 07/02/2015   HGB 9.4* 07/02/2015   HCT 29.8* 07/02/2015   PLT 117* 07/02/2015   LYMPHOPCT 14 07/01/2015   MONOPCT 7 07/01/2015   EOSPCT 0 07/01/2015   BASOPCT 0 07/01/2015    CMP: Lab Results  Component Value Date   NA 139 07/02/2015   K 3.7 07/02/2015   CL 109 07/02/2015   CO2 20* 07/02/2015   BUN 14 07/02/2015   CREATININE 1.03 07/02/2015   CREATININE 0.83 09/13/2014   PROT 7.0  07/01/2015   ALBUMIN 3.4* 07/01/2015   BILITOT 0.5 07/01/2015   ALKPHOS 53 07/01/2015   AST 25 07/01/2015   ALT 15* 07/01/2015  .   Total Time in preparing paper work, data evaluation and todays exam - 35 minutes  Thurnell Lose M.D on 07/02/2015  at 8:55 AM  [facility]   Office  [phone]

## 2015-07-02 NOTE — Progress Notes (Addendum)
Pharmacy Antibiotic Note Gregory Wiley is a 65 y.o. male admitted early 4/16 for acute tonsillitis and subsequently discharged home on Augmentin that was called back to hospital 4/16 pm with GPC in clusters in 2/2 blood cx's. Pharmacy asked to dose vancomycin and Unasyn.  Plan: 1. Vancomycin 2000 mg x 1 followed by Vancomycin 1250 mg every 12 hours starting on 4/17 am  2. Obtain VT at [facility]; goal 15-20 3. SCr Q 72H while on vancomycin 4. Unasyn 3 grams IV every 6 hours for coverage of acute tonsillitis as well as potential MSSA bacteremia pending cx results.    Temp (24hrs), Avg:98.5 F (36.9 C), Min:98 F (36.7 C), Max:98.8 F (37.1 C)   Recent Labs Lab 07/01/15 1926 07/01/15 2018 07/02/15 0319 07/02/15 2127  WBC 5.1  --  3.9* 4.5  CREATININE 1.16  --  1.03 0.96  LATICACIDVEN  --  1.78  --   --     Estimated Creatinine Clearance: 87.6 mL/min (by C-G formula based on Cr of 0.96).    Antimicrobials this admission: 4/16 Vancomycin >> 4/17 Unasyn >>   Dose adjustments this admission: n/a  Microbiology results: 4/15 BCx: GPC in 2/2  4/15 UCx: px  4/15: group a strep: px   Thank you for allowing pharmacy to be a part of this patient's care.  Vincenza Hews, PharmD, BCPS 07/02/2015, 11:24 PM Pager: [phone]

## 2015-07-02 NOTE — Progress Notes (Signed)
Pharmacy Antibiotic Note  Gregory Wiley is a 65 y.o. male admitted on 07/01/2015 with Tonsillitis.  Pharmacy has been consulted for Unasyn dosing. WBC WNL. Renal function good.   Plan: -Unasyn 3g IV q8h -Trend WBC, temp, renal function  -F/U infectious work-up  Height: 5\' 10"  (177.8 cm) Weight: 215 lb (97.523 kg) IBW/kg (Calculated) : 73  Temp (24hrs), Avg:98.1 F (36.7 C), Min:97.9 F (36.6 C), Max:98.4 F (36.9 C)   Recent Labs Lab 07/01/15 1926 07/01/15 2018  WBC 5.1  --   CREATININE 1.16  --   LATICACIDVEN  --  1.78    Estimated Creatinine Clearance: 74.4 mL/min (by C-G formula based on Cr of 1.16).    Allergies  Allergen Reactions  . Diphenhydramine Hcl Anaphylaxis  . Adhesive [Tape] Other (See Comments)    Tears skin - please use paper tape    Narda Bonds 07/02/2015 3:06 AM

## 2015-07-02 NOTE — ED Notes (Signed)
Patient given a [place] sandwich and beverage, okay'd by MD.

## 2015-07-02 NOTE — H&P (Signed)
[facility] History and Physical  Gregory Wiley [id number] DOB: 1950-05-21 DOA: 07/01/2015  Referring physician: ED PCP: [name], MD   Chief Complaint:  Fever and sore throat  HPI:  Mr. Kaczka is a 65 year old male with past medical history significant for CAD, HTN, HLD, chronic adrenal insufficiency, PE on Xarelto, CVA with residual of sided hemiparesis, orthostatic hypotension, OSA on CPAP; who presents with complaints of fever and sore throat. Symptoms started 2 nights ago, he reports having fever up to 100.34F and a new sore throat. Reports a constant achy like feeling on the side of his neck worsened by trying to eat or swallowing. Associated symptoms include decreased appetite and nonproductive cough that is have the last few days. From waking up yesterday morning he noted gradual worsening in the swelling in the left side of his neck. Tried taking Tylenol for the fever and Mucinex for the cough. He initially went to be evaluated at urgent care today but due to significant tachycardia and hypotension was transferred to the emergency department for further evaluation.  Upon admission into the emergency room the patient was seen to be significantly orthostatic. CT scan of the soft tissues of neck revealed signs of cervical adenopathy with possible tonsillitis .    Review of Systems  Constitutional: Positive for fever and malaise/fatigue.  HENT: Positive for congestion and sore throat. Negative for hearing loss.   Eyes: Negative for photophobia and pain.  Respiratory: Positive for cough. Negative for hemoptysis and sputum production.   Cardiovascular: Negative for chest pain and palpitations.  Gastrointestinal: Positive for nausea and vomiting. Negative for abdominal pain.  Genitourinary: Negative for frequency.  Musculoskeletal: Negative for back pain and neck pain.  Skin: Negative for itching and rash.  Neurological: Positive for dizziness and focal weakness. Negative  for loss of consciousness.  Endo/Heme/Allergies: Negative for environmental allergies. Bruises/bleeds easily.  Psychiatric/Behavioral: Positive for memory loss. Negative for hallucinations and substance abuse.        Past Medical History  Diagnosis Date  . Coronary artery disease     stent, then CABG 07/20/10  . Hypertension   . Hyperlipidemia   . Obesity (BMI 30-39.9)   . H/O cardiomyopathy     ischemic, now last echo 07/30/11, EF 38%W  . Diverticulosis   . Internal hemorrhoids   . Tubular adenoma of colon   . Plantar fasciitis of left foot   . Basal cell carcinoma of skin of left ankle   . NSTEMI (non-ST elevated myocardial infarction) ([place])     NSTEMI- last cath 06/2011-Stent to LCX-DES, last nuc 07/30/11 low risk  . MI (myocardial infarction) ([place])     "I had 4 before I retired in 2006" (11/11/2014)  . Pulmonary embolism ([place]) 03/2014  . GERD (gastroesophageal reflux disease)   . Bleeding stomach ulcer 2015  . Daily headache     "for the past month" (11/11/2014)  . Stroke ([place]) 01/2014    "no control of LUE, can slightly LLE; memory problems since" (11/11/2014)  . Arthritis     "back" (11/11/2014)  . Gout   . Depression   . Pneumonia     hosp. 12/2014  . Anxiety   . History of hiatal hernia   . 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 OSA on CPAP     since July 2013- uses CPAP sometimes , states he has lost 147 lbs. since stroke & doesn't use the CPAP as much as  he use to.   . Adrenal insufficiency ([place])   . Left hemiplegia [facility])      Past Surgical History  Procedure Laterality Date  . Coronary artery bypass graft  07/20/2010     LIMA-LAD; VG-ACUTE MARG of RCA; Seq VG-distal RCA & then pda  . Coronary angioplasty with stent placement  07/01/2011    DES-Resolute to native LCX  . Coronary angioplasty with stent placement  12/01/2007    COMPLEX 5 LESION PCI INCLUDING CUTTING BALLOON AND 4 CYPHER DESs  . Knee arthroscopy Right   . Hand surgery  Right     to take glass out  . Colonoscopy N/A 02/10/2013    Procedure: COLONOSCOPY;  Surgeon: Ladene Artist, MD;  Location: [facility] ENDOSCOPY;  Service: Endoscopy;  Laterality: N/A;  . Retinal detachment surgery Right   . Loop recorder implant  02/15/2014    MDT LINQ implanted by Dr Rayann Heman for cryptogenic stroke  . Tee without cardioversion N/A 02/15/2014    Procedure: TRANSESOPHAGEAL ECHOCARDIOGRAM (TEE);  Surgeon: Josue Hector, MD;  Location: [facility] ENDOSCOPY;  Service: Cardiovascular;  Laterality: N/A;  . Left heart catheterization with coronary/graft angiogram  07/01/2011    Procedure: LEFT HEART CATHETERIZATION WITH Beatrix Fetters;  Surgeon: Sanda Klein, MD;  Location: [facility] CATH LAB;  Service: Cardiovascular;;  . Percutaneous coronary stent intervention (pci-s) Right 07/01/2011    Procedure: PERCUTANEOUS CORONARY STENT INTERVENTION (PCI-S);  Surgeon: Sanda Klein, MD;  Location: [facility] CATH LAB;  Service: Cardiovascular;  Laterality: Right;  . Umbilical hernia repair  2006  . Hernia repair    . Basal cell carcinoma excision Left 2015    ankle  . Esophagogastroduodenoscopy  01/2014    gastric ulcer, erosive gastroduodenitis, H Pylori negative.   . Esophagogastroduodenoscopy (egd) with propofol N/A 11/14/2014    Procedure: ESOPHAGOGASTRODUODENOSCOPY (EGD) WITH PROPOFOL;  Surgeon: Milus Banister, MD;  Location: [place];  Service: Endoscopy;  Laterality: N/A;  . Intramedullary (im) nail intertrochanteric Right 02/07/2015    Procedure: INTRAMEDULLARY (IM) NAIL INTERTROCHANTRIC RIGHT HIP;  Surgeon: Renette Butters, MD;  Location: [place];  Service: Orthopedics;  Laterality: Right;  . Cardiac surgery        Social History:  reports that he quit smoking about 46 years ago. His smoking use included Cigarettes. He has a 8 pack-year smoking history. He has never used smokeless tobacco. He reports that he uses illicit drugs (Marijuana). He reports that he does not drink alcohol. Where does  patient live--home  and with whom if at home? Family  Can patient participate in ADLs?Can transfer usually on his own  Allergies  Allergen Reactions  . Diphenhydramine Hcl Anaphylaxis  . Adhesive [Tape] Other (See Comments)    Tears skin - please use paper tape    Family History  Problem Relation Age of Onset  . Diabetes type II Mother   . Coronary artery disease    . Colon cancer Neg Hx   . CAD Father 58    CABG, PCI, died at 13  . Heart attack Father 58  . Hypertension Brother   . Diabetes Brother   . Hyperlipidemia Brother   . Brain cancer Maternal Grandmother   . COPD Paternal Grandfather   . Other      denies family history of DVT/PE  . Stroke Neg Hx   . Other Neg Hx     adrenal dz      Prior to Admission medications   Medication Sig Start Date End Date Taking?  Authorizing Provider  acetaminophen (TYLENOL) 325 MG tablet Take 1 tablet (325 mg total) by mouth every 6 (six) hours as needed for mild pain. 12/27/14  Yes Florencia Reasons, MD  ALPRAZolam Duanne Moron) 0.25 MG tablet Take 0.5 tablets (0.125 mg total) by mouth every 8 (eight) hours as needed for anxiety. 04/14/14  Yes Tiffany L Reed, DO  aspirin EC 81 MG tablet Take 81 mg by mouth daily.   Yes Historical Provider, MD  colchicine 0.6 MG tablet Take 0.6 mg by mouth as needed (for gout). Reported on 05/26/2015 04/09/14  Yes Historical Provider, MD  Fexofenadine HCl (ALLEGRA PO) Take 1 tablet by mouth daily as needed (seasonal allergies).   Yes Historical Provider, MD  fludrocortisone (FLORINEF) 0.1 MG tablet Take 1 tablet (0.1 mg total) by mouth 2 (two) times daily. 05/26/15  Yes Renato Shin, MD  fluticasone (FLONASE) 50 MCG/ACT nasal spray Place 2 sprays into both nostrils at bedtime as needed (seasonal allergies).    Yes Historical Provider, MD  HYDROcodone-acetaminophen (NORCO) 5-325 MG tablet Take 1-2 tablets by mouth every 6 (six) hours as needed for moderate pain. 05/15/15  Yes Britt Bottom, MD  hydrocortisone (ANUSOL-HC) 25  MG suppository Place 25 mg rectally as needed for hemorrhoids or itching.   Yes Historical Provider, MD  Melatonin 10 MG CAPS Take 10 mg by mouth at bedtime as needed (sleep).    Yes Historical Provider, MD  midodrine (PROAMATINE) 2.5 MG tablet Take 1 tablet (2.5 mg total) by mouth 3 (three) times daily with meals. 05/10/15  Yes Renato Shin, MD  nitroGLYCERIN (NITROSTAT) 0.4 MG SL tablet Place 1 tablet (0.4 mg total) under the tongue every 5 (five) minutes as needed. For chest pain Patient taking differently: Place 0.4 mg under the tongue every 5 (five) minutes as needed for chest pain.  05/21/15  Yes Tonia Ghent, MD  OVER THE COUNTER MEDICATION Inhale 1 application into the lungs at bedtime. C-PAP   Yes Historical Provider, MD  pantoprazole (PROTONIX) 40 MG tablet Take 1 tablet (40 mg total) by mouth 2 (two) times daily. 04/05/15  Yes [name], MD  potassium chloride (K-DUR) 10 MEQ tablet TAKE 4 TABLETS (40 MEQ TOTAL) BY MOUTH DAILY. Patient taking differently: Take 40 mEq by mouth daily. TAKE 4 TABLETS (40 MEQ TOTAL) BY MOUTH DAILY 03/21/15  Yes Tonia Ghent, MD  rivaroxaban (XARELTO) 20 MG TABS tablet Take 1 tablet (20 mg total) by mouth daily with supper. 06/01/15  Yes Britt Bottom, MD  rosuvastatin (CRESTOR) 40 MG tablet Take 1 tablet (40 mg total) by mouth daily. Patient taking differently: Take 40 mg by mouth at bedtime.  06/30/15  Yes [name], MD  senna (SENOKOT) 8.6 MG TABS tablet Take 1 tablet by mouth 2 (two) times daily.   Yes Historical Provider, MD  senna-docusate (SENNA S) 8.6-50 MG tablet Take 1 tablet by mouth 2 (two) times daily.   Yes Historical Provider, MD  sertraline (ZOLOFT) 50 MG tablet Take 1 tablet (50 mg total) by mouth daily. 01/09/15  Yes Britt Bottom, MD  tiZANidine (ZANAFLEX) 4 MG tablet Take 4 mg by mouth 2 (two) times daily as needed for muscle spasms.  06/21/15  Yes Historical Provider, MD  potassium chloride (K-DUR) 10 MEQ tablet TAKE 4 TABLETS BY  MOUTH DAILY. Patient not taking: Reported on 07/01/2015 06/21/15   Tonia Ghent, MD  promethazine (PHENERGAN) 12.5 MG tablet Take 1-2 tablets (12.5-25 mg total) by mouth every  6 (six) hours as needed for nausea or vomiting. Patient not taking: Reported on 07/01/2015 10/28/14   Pleas Koch, NP     Physical Exam: Filed Vitals:   07/02/15 0015 07/02/15 0130 07/02/15 0153 07/02/15 0244  BP: 128/75 124/70 117/71 133/76  Pulse: 85 82 75 97  Temp:    98 F (36.7 C)  TempSrc:    Oral  Resp:   18 18  Height:    5\' 10"  (1.778 m)  Weight:    92.216 kg (203 lb 4.8 oz)  SpO2: 95% 91% 93% 95%     Constitutional: Vital signs reviewed.Pa tient appears sickly in appearance, but able to cooperative with exam. Alert and oriented x3.  Head: Normocephalic and atraumatic  Ear: TM normal bilaterally  Mouth: no erythema or exudates, Erythema of the posterior oropharynx Inflamed tonsils noted  Eyes: PERRL, EOMI, conjunctivae normal, No scleral icterus.  Neck: Supple, Trachea midline normal ROM, No JVD, mass, thyromegaly, or carotid bruit present.  Cardiovascular: RRR, S1 normal, S2 normal, no MRG, pulses symmetric and intact bilaterally  Pulmonary/Chest: CTAB, no wheezes, rales, or rhonchi  Abdominal: Soft. Non-tender, non-distended, bowel sounds are normal, no masses, organomegaly, or guarding present.  GU: no CVA tenderness Musculoskeletal: No joint deformities, erythema, or stiffness, ROM full and no nontender Ext: no edema and no cyanosis, pulses palpable bilaterally (DP and PT)  Hematology: Significant Left-sided submandibular adenopathy with tenderness to palpation Neurological: A&O x3, left hemiparesis Skin: Warm, dry and intact. No rash, cyanosis, or clubbing.  Psychiatric: Normal mood and affect. speech and behavior is normal. Judgment and thought content normal. Cognition and memory are normal.      Data Review   Micro Results No results found for this or any previous visit (from the  past 240 hour(s)).  Radiology Reports Dg Chest 2 View  07/01/2015  CLINICAL DATA:  Cough and fever EXAM: CHEST  2 VIEW COMPARISON:  04/07/2014 FINDINGS: Cardiomegaly with negative aortic and hilar contours. Status post CABG. Low volume chest with interstitial crowding. There is no edema, consolidation, effusion, or pneumothorax. Implantable loop recorder. 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 Patient has previous cervical spine CT imaging in 2016. IMPRESSION: Low volume chest without acute finding. Electronically Signed   By: Monte Fantasia M.D.   On: 07/01/2015 20:46   Ct Soft Tissue Neck W Contrast  07/02/2015  CLINICAL DATA:  Initial evaluation for acute left-sided neck swelling with sore throat. EXAM: CT NECK WITH CONTRAST TECHNIQUE: Multidetector CT imaging of the neck was performed using the standard protocol following the bolus administration of intravenous contrast. CONTRAST:  1 ISOVUE-300 IOPAMIDOL (ISOVUE-300) INJECTION 61% COMPARISON:  None. FINDINGS: Visualized portions of the brain demonstrate no acute process. Encephalomalacia noted within the anterior right temporal pole. Partially visualized globes and orbits demonstrate no acute abnormality. Scattered mucosal thickening within the max O sinuses and ethmoidal air cells. Fluid level within the right sphenoid sinus. Mucosal thickening within the left sphenoid sinus. Mastoid air cells are clear. Middle ear cavities are clear. Salivary glands including the parotid glands and submandibular glands are normal. Oral cavity is unremarkable, although evaluation somewhat limited by streak artifact from dental amalgam. No acute abnormality about the dentition. Palatine tonsils are mildly prominent and hyper enhancing, which may reflect acute tonsillitis. No peritonsillar abscess or other collection. Epiglottis normal. Vallecula clear. No retropharyngeal fluid collection or edema. Remainder of the hypopharynx and  supraglottic larynx within normal limits without acute inflammatory changes. True cords grossly  normal. Subglottic airway clear. Approximately 2 cm hypodense nodule with internal calcification present within the inferior left thyroid lobe, indeterminate. Thyroid otherwise within normal limits. Enlarged left level 2 lymph nodes measure up to 13 mm. A few of these nodes are somewhat heterogeneous and partially hypodense in appearance. Increased number of left level 2 P and level 5 nodes as compared to the right. These measure up to 15 mm. Increased number of subcentimeter left level 2/3 nodes as well. There is associated inflammatory stranding within the adjacent fat within the is area of adenopathy in the left neck. Finding is indeterminate, and could reflect supper node adenitis or reactive adenopathy. Nodal malignancy could also have this appearance. No significant adenopathy seen within the right neck. Visualized superior mediastinum within normal limits. Visualized lungs are clear. Normal intravascular enhancement seen throughout the neck. No acute osseous abnormality. No worrisome lytic or blastic osseous lesions. Prominent bridging anterior osteophytes seen throughout the cervical spine. IMPRESSION: 1. Left-sided cervical adenopathy with associated inflammatory stranding as detailed above, indeterminate, but may reflect sequela of suppurative adenitis. Alternatively, these nodes may be reactive in nature. Nodal malignancy could also have this appearance. Clinical followup to resolution is recommended. 2. Question mild enlargement and hyper enhancement of the palatine tonsils, which may reflect acute tonsillitis. Correlation with physical exam and direct visualization recommended. 3. Approximately 2 cm left thyroid nodule, indeterminate. Correlation with dedicated thyroid ultrasound recommended. This could be performed on a nonemergent basis. 4. Prominent anterior bridging osteophytic spurring throughout the  cervical spine. 5. Acute on chronic paranasal sinus disease as above. Electronically Signed   By: Jeannine Boga M.D.   On: 07/02/2015 00:13     CBC  Recent Labs Lab 07/01/15 1926  WBC 5.1  HGB 10.7*  HCT 35.6*  PLT 163  MCV 81.7  MCH 24.5*  MCHC 30.1  RDW 14.4  LYMPHSABS 0.7  MONOABS 0.4  EOSABS 0.0  BASOSABS 0.0    Chemistries   Recent Labs Lab 07/01/15 1926  NA 140  K 3.4*  CL 107  CO2 23  GLUCOSE 125*  BUN 15  CREATININE 1.16  CALCIUM 8.8*  AST 25  ALT 15*  ALKPHOS 53  BILITOT 0.5   ------------------------------------------------------------------------------------------------------------------ estimated creatinine clearance is 72.5 mL/min (by C-G formula based on Cr of 1.16). ------------------------------------------------------------------------------------------------------------------ No results for input(s): HGBA1C in the last 72 hours. ------------------------------------------------------------------------------------------------------------------ No results for input(s): CHOL, HDL, LDLCALC, TRIG, CHOLHDL, LDLDIRECT in the last 72 hours. ------------------------------------------------------------------------------------------------------------------ No results for input(s): TSH, T4TOTAL, T3FREE, THYROIDAB in the last 72 hours.  Invalid input(s): FREET3 ------------------------------------------------------------------------------------------------------------------ No results for input(s): VITAMINB12, FOLATE, FERRITIN, TIBC, IRON, RETICCTPCT in the last 72 hours.  Coagulation profile No results for input(s): INR, PROTIME in the last 168 hours.  No results for input(s): DDIMER in the last 72 hours.  Cardiac Enzymes No results for input(s): CKMB, TROPONINI, MYOGLOBIN in the last 168 hours.  Invalid input(s): CK ------------------------------------------------------------------------------------------------------------------ Invalid  input(s): POCBNP   CBG: No results for input(s): GLUCAP in the last 168 hours.     EKG: Independently reviewed. Sinus rhythm with a left anterior fascicular block   Assessment/Plan Sore throat with possible tonsillitis: Strep throat cultures found to be negative. CT scan of the neck showing left cervical adenopathy with possible tonsillitis - Admit to a MedSurg bed - abx of Unasyn    Hypotension with history of adrenal insufficiency: Given a stress dose of hydrocortisone in ED - Continue Midorine - IV fluids normal saline  History of bilateral  PE - Continue Xarelto  CVA with residual left-sided hemiparesis: Stable  OSA on CPAP - Respiratory to supply CPAP    Code Status:   full Family Communication: bedside Disposition Plan: admit   Total time spent 55 minutes.Greater than 50% of this time was spent in counseling, explanation of diagnosis, planning of further management, and coordination of care  [facility] Pager [phone]  If 7PM-7AM, please contact night-coverage [email or web address] Password [email or web address] 07/02/2015, 3:50 AM

## 2015-07-02 NOTE — Discharge Instructions (Signed)
Follow with Primary MD [name], MD in 3 days   Get CBC, CMP, 2 view Chest X ray checked  by Primary MD next visit.    Activity: As tolerated with Full fall precautions use walker/cane & assistance as needed   Disposition Home     Diet:   Heart Healthy   For Heart failure patients - Check your Weight same time everyday, if you gain over 2 pounds, or you develop in leg swelling, experience more shortness of breath or chest pain, call your Primary MD immediately. Follow Cardiac Low Salt Diet and 1.5 lit/day fluid restriction.   On your next visit with your primary care physician please Get Medicines reviewed and adjusted.   Please request your Prim.MD to go over all Hospital Tests and Procedure/Radiological results at the follow up, please get all Hospital records sent to your Prim MD by signing hospital release before you go home.   If you experience worsening of your admission symptoms, develop shortness of breath, life threatening emergency, suicidal or homicidal thoughts you must seek medical attention immediately by calling 911 or calling your MD immediately  if symptoms less severe.  You Must read complete instructions/literature along with all the possible adverse reactions/side effects for all the Medicines you take and that have been prescribed to you. Take any new Medicines after you have completely understood and accpet all the possible adverse reactions/side effects.   Do not drive, operating heavy machinery, perform activities at heights, swimming or participation in water activities or provide baby sitting services if your were admitted for syncope or siezures until you have seen by Primary MD or a Neurologist and advised to do so again.  Do not drive when taking Pain medications.    Do not take more than prescribed Pain, Sleep and Anxiety Medications  Special Instructions: If you have smoked or chewed Tobacco  in the last 2 yrs please stop smoking, stop any regular  Alcohol  and or any Recreational drug use.  Wear Seat belts while driving.   Please note  You were cared for by a hospitalist during your hospital stay. If you have any questions about your discharge medications or the care you received while you were in the hospital after you are discharged, you can call the unit and asked to speak with the hospitalist on call if the hospitalist that took care of you is not available. Once you are discharged, your primary care physician will handle any further medical issues. Please note that NO REFILLS for any discharge medications will be authorized once you are discharged, as it is imperative that you return to your primary care physician (or establish a relationship with a primary care physician if you do not have one) for your aftercare needs so that they can reassess your need for medications and monitor your lab values.

## 2015-07-03 ENCOUNTER — Emergency Department (HOSPITAL_COMMUNITY): Payer: Medicare Other

## 2015-07-03 ENCOUNTER — Encounter (HOSPITAL_COMMUNITY): Payer: Self-pay | Admitting: Radiology

## 2015-07-03 DIAGNOSIS — K219 Gastro-esophageal reflux disease without esophagitis: Secondary | ICD-10-CM | POA: Diagnosis present

## 2015-07-03 DIAGNOSIS — A499 Bacterial infection, unspecified: Secondary | ICD-10-CM | POA: Diagnosis not present

## 2015-07-03 DIAGNOSIS — R7881 Bacteremia: Secondary | ICD-10-CM | POA: Diagnosis present

## 2015-07-03 DIAGNOSIS — S301XXA Contusion of abdominal wall, initial encounter: Secondary | ICD-10-CM | POA: Diagnosis not present

## 2015-07-03 DIAGNOSIS — D62 Acute posthemorrhagic anemia: Secondary | ICD-10-CM | POA: Diagnosis present

## 2015-07-03 DIAGNOSIS — E785 Hyperlipidemia, unspecified: Secondary | ICD-10-CM | POA: Diagnosis present

## 2015-07-03 DIAGNOSIS — R509 Fever, unspecified: Secondary | ICD-10-CM | POA: Diagnosis present

## 2015-07-03 DIAGNOSIS — Z6830 Body mass index (BMI) 30.0-30.9, adult: Secondary | ICD-10-CM | POA: Diagnosis not present

## 2015-07-03 DIAGNOSIS — I951 Orthostatic hypotension: Secondary | ICD-10-CM | POA: Diagnosis present

## 2015-07-03 DIAGNOSIS — I340 Nonrheumatic mitral (valve) insufficiency: Secondary | ICD-10-CM | POA: Diagnosis not present

## 2015-07-03 DIAGNOSIS — G4733 Obstructive sleep apnea (adult) (pediatric): Secondary | ICD-10-CM | POA: Diagnosis present

## 2015-07-03 DIAGNOSIS — Z87891 Personal history of nicotine dependence: Secondary | ICD-10-CM | POA: Diagnosis not present

## 2015-07-03 DIAGNOSIS — I480 Paroxysmal atrial fibrillation: Secondary | ICD-10-CM | POA: Diagnosis present

## 2015-07-03 DIAGNOSIS — Z86711 Personal history of pulmonary embolism: Secondary | ICD-10-CM | POA: Diagnosis not present

## 2015-07-03 DIAGNOSIS — S3011XA Contusion of abdominal wall, initial encounter: Secondary | ICD-10-CM | POA: Diagnosis present

## 2015-07-03 DIAGNOSIS — F329 Major depressive disorder, single episode, unspecified: Secondary | ICD-10-CM | POA: Diagnosis present

## 2015-07-03 DIAGNOSIS — I252 Old myocardial infarction: Secondary | ICD-10-CM | POA: Diagnosis not present

## 2015-07-03 DIAGNOSIS — M7981 Nontraumatic hematoma of soft tissue: Secondary | ICD-10-CM | POA: Diagnosis present

## 2015-07-03 DIAGNOSIS — Z85828 Personal history of other malignant neoplasm of skin: Secondary | ICD-10-CM | POA: Diagnosis not present

## 2015-07-03 DIAGNOSIS — E2740 Unspecified adrenocortical insufficiency: Secondary | ICD-10-CM | POA: Diagnosis present

## 2015-07-03 DIAGNOSIS — B957 Other staphylococcus as the cause of diseases classified elsewhere: Secondary | ICD-10-CM | POA: Diagnosis present

## 2015-07-03 DIAGNOSIS — Z951 Presence of aortocoronary bypass graft: Secondary | ICD-10-CM | POA: Diagnosis not present

## 2015-07-03 DIAGNOSIS — S301XXD Contusion of abdominal wall, subsequent encounter: Secondary | ICD-10-CM

## 2015-07-03 DIAGNOSIS — I10 Essential (primary) hypertension: Secondary | ICD-10-CM | POA: Diagnosis present

## 2015-07-03 DIAGNOSIS — R109 Unspecified abdominal pain: Secondary | ICD-10-CM

## 2015-07-03 DIAGNOSIS — Z79899 Other long term (current) drug therapy: Secondary | ICD-10-CM | POA: Diagnosis not present

## 2015-07-03 DIAGNOSIS — I69354 Hemiplegia and hemiparesis following cerebral infarction affecting left non-dominant side: Secondary | ICD-10-CM | POA: Diagnosis not present

## 2015-07-03 DIAGNOSIS — Z955 Presence of coronary angioplasty implant and graft: Secondary | ICD-10-CM | POA: Diagnosis not present

## 2015-07-03 DIAGNOSIS — Z7982 Long term (current) use of aspirin: Secondary | ICD-10-CM | POA: Diagnosis not present

## 2015-07-03 DIAGNOSIS — E669 Obesity, unspecified: Secondary | ICD-10-CM | POA: Diagnosis present

## 2015-07-03 DIAGNOSIS — J039 Acute tonsillitis, unspecified: Secondary | ICD-10-CM | POA: Diagnosis present

## 2015-07-03 DIAGNOSIS — R1013 Epigastric pain: Secondary | ICD-10-CM | POA: Diagnosis not present

## 2015-07-03 DIAGNOSIS — I2510 Atherosclerotic heart disease of native coronary artery without angina pectoris: Secondary | ICD-10-CM | POA: Diagnosis present

## 2015-07-03 DIAGNOSIS — Z7952 Long term (current) use of systemic steroids: Secondary | ICD-10-CM | POA: Diagnosis not present

## 2015-07-03 DIAGNOSIS — Z7901 Long term (current) use of anticoagulants: Secondary | ICD-10-CM | POA: Diagnosis not present

## 2015-07-03 DIAGNOSIS — I2699 Other pulmonary embolism without acute cor pulmonale: Secondary | ICD-10-CM | POA: Diagnosis not present

## 2015-07-03 DIAGNOSIS — I255 Ischemic cardiomyopathy: Secondary | ICD-10-CM | POA: Diagnosis present

## 2015-07-03 DIAGNOSIS — E876 Hypokalemia: Secondary | ICD-10-CM | POA: Diagnosis present

## 2015-07-03 LAB — BASIC METABOLIC PANEL
Anion gap: 10 (ref 5–15)
BUN: 10 mg/dL (ref 6–20)
CO2: 20 mmol/L — ABNORMAL LOW (ref 22–32)
Calcium: 7.7 mg/dL — ABNORMAL LOW (ref 8.9–10.3)
Chloride: 109 mmol/L (ref 101–111)
Creatinine, Ser: 0.88 mg/dL (ref 0.61–1.24)
GFR calc Af Amer: >60 mL/min (ref >60)
GFR calc non Af Amer: >60 mL/min (ref >60)
Glucose, Bld: 98 mg/dL (ref 65–99)
Potassium: 3.2 mmol/L — ABNORMAL LOW (ref 3.5–5.1)
Sodium: 139 mmol/L (ref 135–145)

## 2015-07-03 LAB — CBC
HCT: 25.7 % — ABNORMAL LOW (ref 39.0–52.0)
Hemoglobin: 8.3 g/dL — ABNORMAL LOW (ref 13.0–17.0)
MCH: 26 pg (ref 26.0–34.0)
MCHC: 32.3 g/dL (ref 30.0–36.0)
MCV: 80.6 fL (ref 78.0–100.0)
Platelets: 102 10*3/uL — ABNORMAL LOW (ref 150–400)
RBC: 3.19 MIL/uL — ABNORMAL LOW (ref 4.22–5.81)
RDW: 14.5 % (ref 11.5–15.5)
WBC: 4.2 10*3/uL (ref 4.0–10.5)

## 2015-07-03 LAB — URINE CULTURE

## 2015-07-03 LAB — LACTIC ACID, PLASMA: Lactic Acid, Venous: 1.6 mmol/L (ref 0.5–2.0)

## 2015-07-03 LAB — PREPARE RBC (CROSSMATCH)

## 2015-07-03 LAB — PROTIME-INR
INR: 1.2 (ref 0.00–1.49)
Prothrombin Time: 15.3 seconds — ABNORMAL HIGH (ref 11.6–15.2)

## 2015-07-03 LAB — HEMOGLOBIN AND HEMATOCRIT, BLOOD
HCT: 28.5 % — ABNORMAL LOW (ref 39.0–52.0)
Hemoglobin: 8.7 g/dL — ABNORMAL LOW (ref 13.0–17.0)

## 2015-07-03 MED ORDER — ONDANSETRON HCL 4 MG PO TABS
4.0000 mg | ORAL_TABLET | Freq: Four times a day (QID) | ORAL | Status: DC | PRN
Start: 2015-07-03 — End: 2015-07-03

## 2015-07-03 MED ORDER — POTASSIUM CHLORIDE CRYS ER 20 MEQ PO TBCR: 40.0000 meq | EXTENDED_RELEASE_TABLET | ORAL | Status: DC

## 2015-07-03 MED ORDER — IOPAMIDOL (ISOVUE-300) INJECTION 61%
INTRAVENOUS | Status: AC
Start: 2015-07-03 — End: 2015-07-03
  Administered 2015-07-03: 100 mL
  Filled 2015-07-03: qty 100

## 2015-07-03 MED ORDER — MIDODRINE HCL 5 MG PO TABS
2.5000 mg | ORAL_TABLET | Freq: Three times a day (TID) | ORAL | Status: DC
  Administered 2015-07-03 – 2015-07-09 (×18): 2.5 mg via ORAL
  Filled 2015-07-03 (×19): qty 1

## 2015-07-03 MED ORDER — PANTOPRAZOLE SODIUM 40 MG PO TBEC
40.0000 mg | DELAYED_RELEASE_TABLET | Freq: Two times a day (BID) | ORAL | Status: DC
  Administered 2015-07-03 – 2015-07-09 (×13): 40 mg via ORAL
  Filled 2015-07-03 (×13): qty 1

## 2015-07-03 MED ORDER — SERTRALINE HCL 50 MG PO TABS
50.0000 mg | ORAL_TABLET | Freq: Every day | ORAL | Status: DC
  Administered 2015-07-03 – 2015-07-09 (×7): 50 mg via ORAL
  Filled 2015-07-03 (×7): qty 1

## 2015-07-03 MED ORDER — ONDANSETRON HCL 4 MG/2ML IJ SOLN: 4.0000 mg | Freq: Four times a day (QID) | INTRAMUSCULAR | Status: DC | PRN

## 2015-07-03 MED ORDER — SODIUM CHLORIDE 0.9 % IV BOLUS (SEPSIS)
1000.0000 mL | Freq: Once | INTRAVENOUS | Status: AC
  Administered 2015-07-03: 1000 mL via INTRAVENOUS

## 2015-07-03 MED ORDER — SODIUM CHLORIDE 0.9 % IV SOLN: INTRAVENOUS | Status: DC

## 2015-07-03 MED ORDER — MELATONIN 10 MG PO CAPS: 10.0000 mg | ORAL_CAPSULE | Freq: Every evening | ORAL | Status: DC | PRN

## 2015-07-03 MED ORDER — SODIUM CHLORIDE 0.9 % IV SOLN
INTRAVENOUS | Status: DC
Start: 2015-07-03 — End: 2015-07-03
  Administered 2015-07-03: 05:00:00 via INTRAVENOUS

## 2015-07-03 MED ORDER — POTASSIUM CHLORIDE CRYS ER 20 MEQ PO TBCR
40.0000 meq | EXTENDED_RELEASE_TABLET | Freq: Every day | ORAL | Status: DC
  Administered 2015-07-03 – 2015-07-09 (×7): 40 meq via ORAL
  Filled 2015-07-03 (×7): qty 2

## 2015-07-03 MED ORDER — SENNA 8.6 MG PO TABS
1.0000 | ORAL_TABLET | Freq: Two times a day (BID) | ORAL | Status: DC
  Administered 2015-07-03 – 2015-07-09 (×12): 8.6 mg via ORAL
  Filled 2015-07-03 (×13): qty 1

## 2015-07-03 MED ORDER — SODIUM CHLORIDE 0.9 % IV SOLN
Freq: Once | INTRAVENOUS | Status: AC
  Administered 2015-07-03: 05:00:00 via INTRAVENOUS

## 2015-07-03 MED ORDER — AMPICILLIN-SULBACTAM SODIUM 3 (2-1) G IJ SOLR
3.0000 g | Freq: Four times a day (QID) | INTRAMUSCULAR | Status: DC
  Administered 2015-07-03 – 2015-07-07 (×17): 3 g via INTRAVENOUS
  Filled 2015-07-03 (×24): qty 3

## 2015-07-03 MED ORDER — NITROGLYCERIN 0.4 MG SL SUBL: 0.4000 mg | SUBLINGUAL_TABLET | SUBLINGUAL | Status: DC | PRN

## 2015-07-03 MED ORDER — ACETAMINOPHEN 325 MG PO TABS
650.0000 mg | ORAL_TABLET | Freq: Once | ORAL | Status: AC
  Administered 2015-07-03: 650 mg via ORAL
  Filled 2015-07-03: qty 2

## 2015-07-03 MED ORDER — POTASSIUM CHLORIDE CRYS ER 20 MEQ PO TBCR
40.0000 meq | EXTENDED_RELEASE_TABLET | Freq: Once | ORAL | Status: AC
  Administered 2015-07-03: 40 meq via ORAL
  Filled 2015-07-03: qty 2

## 2015-07-03 MED ORDER — VANCOMYCIN HCL 10 G IV SOLR
1250.0000 mg | Freq: Two times a day (BID) | INTRAVENOUS | Status: DC
  Administered 2015-07-03 – 2015-07-08 (×11): 1250 mg via INTRAVENOUS
  Filled 2015-07-03 (×13): qty 1250

## 2015-07-03 MED ORDER — TIZANIDINE HCL 4 MG PO TABS
4.0000 mg | ORAL_TABLET | Freq: Two times a day (BID) | ORAL | Status: DC | PRN
  Administered 2015-07-06: 4 mg via ORAL
  Filled 2015-07-03: qty 1

## 2015-07-03 MED ORDER — SENNOSIDES-DOCUSATE SODIUM 8.6-50 MG PO TABS
1.0000 | ORAL_TABLET | Freq: Two times a day (BID) | ORAL | Status: DC
Start: 2015-07-03 — End: 2015-07-09
  Administered 2015-07-03 – 2015-07-09 (×13): 1 via ORAL
  Filled 2015-07-03 (×14): qty 1

## 2015-07-03 MED ORDER — MORPHINE SULFATE (PF) 2 MG/ML IV SOLN
2.0000 mg | INTRAVENOUS | Status: DC | PRN
  Administered 2015-07-03: 2 mg via INTRAVENOUS
  Filled 2015-07-03: qty 1

## 2015-07-03 MED ORDER — GUAIFENESIN-CODEINE 100-10 MG/5ML PO SOLN
10.0000 mL | Freq: Four times a day (QID) | ORAL | Status: DC
  Administered 2015-07-03 – 2015-07-09 (×25): 10 mL via ORAL
  Filled 2015-07-03 (×25): qty 10

## 2015-07-03 MED ORDER — HYDROCODONE-ACETAMINOPHEN 5-325 MG PO TABS
1.0000 | ORAL_TABLET | Freq: Four times a day (QID) | ORAL | Status: DC | PRN
  Administered 2015-07-04: 2 via ORAL
  Filled 2015-07-03: qty 2

## 2015-07-03 MED ORDER — ALPRAZOLAM 0.25 MG PO TABS: 0.1250 mg | ORAL_TABLET | Freq: Three times a day (TID) | ORAL | Status: DC | PRN

## 2015-07-03 MED ORDER — FLUDROCORTISONE ACETATE 0.1 MG PO TABS
0.1000 mg | ORAL_TABLET | Freq: Two times a day (BID) | ORAL | Status: DC
  Administered 2015-07-03 – 2015-07-09 (×13): 0.1 mg via ORAL
  Filled 2015-07-03 (×15): qty 1

## 2015-07-03 MED ORDER — ROSUVASTATIN CALCIUM 20 MG PO TABS
40.0000 mg | ORAL_TABLET | Freq: Every day | ORAL | Status: DC
  Administered 2015-07-03 – 2015-07-08 (×6): 40 mg via ORAL
  Filled 2015-07-03 (×3): qty 2
  Filled 2015-07-03: qty 1
  Filled 2015-07-03 (×3): qty 2

## 2015-07-03 NOTE — Progress Notes (Signed)
PROGRESS NOTE                                                                                                                                                                                                             Patient Demographics:    Gregory Wiley, is a 65 y.o. male, DOB - January 28, 1951, [id number]  Admit date - 07/02/2015   Admitting Physician No admitting provider for patient encounter.  Outpatient Primary MD for the patient is [name], MD  LOS - 0  Outpatient Specialists:   Chief Complaint  Patient presents with  . Fever  . Nausea  . Cough       Brief Narrative      Subjective:    Gregory Wiley today has, No headache, No chest pain, No abdominal pain - No Nausea, No new weakness tingling or numbness, No Cough - SOB. Dull generalized abdominal pain   Assessment  & Plan :   1.Rectus sheath hematoma. Cause due to persistent cough with musculoskeletal strain in the setting of steroid to use. Hold xaralto, cough suppressant, supportive care, monitor H&H, type and screen.  2. Possible acute tonsillitis with reactive cervical submandibular lymphadenopathy left greater than right - had shown much improvement with Unasyn/Augmentin which will be continued, last admission one out of 2 blood cultures came back positive for gram-positive cocci in clusters which could be a contaminant, however since patient was febrile has been placed on combination of vancomycin and Unasyn, continue to monitor clinically and final blood culture results.  Once discharged a needs to follow-up with ENT one time in 1-2 weeks till his lymphadenopathy and left-sided neck swelling has resolved.   3. History of adrenal insufficiency with hypotension in the ER. Resolved with IV fluids, he is already on midodrine and Florinef which will be continued, BP stable.  4. 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 Holding anticoagulation for now due  to rectus sheath bleed, will monitor closely. SCDs for now. We'll check baseline lower extremity venous duplex.  5. OSA. CPAP at night continue.  6. History of CVA with residual left-sided hemiparesis. At baseline.    Code Status : Full  Family Communication  : None present  Disposition Plan  : Stay inpatient  Barriers For Discharge : Rectus sheath bleed  Consults  :  CCS over the phone by  admitting MD -  have nothing to offer  Procedures  :   CT Neck - consistent of tonsillitis and enlarged submandibular lymph nodes  CT scan abdomen and pelvis. Showing fatty liver along with rectus sheath hematoma  DVT Prophylaxis  :  SCDs    Lab Results  Component Value Date   PLT 102* 07/03/2015    Antibiotics  :    Anti-infectives    Start     Dose/Rate Route Frequency Ordered Stop   07/03/15 1100  vancomycin (VANCOCIN) 1,250 mg in sodium chloride 0.9 % 250 mL IVPB     1,250 mg 166.7 mL/hr over 90 Minutes Intravenous Every 12 hours 07/02/15 2329     07/03/15 0130  Ampicillin-Sulbactam (UNASYN) 3 g in sodium chloride 0.9 % 100 mL IVPB     3 g 100 mL/hr over 60 Minutes Intravenous Every 6 hours 07/03/15 0121     07/02/15 2315  vancomycin (VANCOCIN) 2,000 mg in sodium chloride 0.9 % 500 mL IVPB     2,000 mg 250 mL/hr over 120 Minutes Intravenous  Once 07/02/15 2311 07/03/15 0344        Objective:   Filed Vitals:   07/03/15 0615 07/03/15 0630 07/03/15 0645 07/03/15 0715  BP: 131/74 123/72 122/74 133/86  Pulse: 73 72 67 64  Temp:      TempSrc:      Resp:    21  SpO2: 90% 92% 96% 92%    Wt Readings from Last 3 Encounters:  07/02/15 92.216 kg (203 lb 4.8 oz)  05/26/15 97.977 kg (216 lb)  05/22/15 97.977 kg (216 lb)     Intake/Output Summary (Last 24 hours) at 07/03/15 0908 Last data filed at 07/03/15 0344  Gross per 24 hour  Intake    500 ml  Output      0 ml  Net    500 ml     Physical Exam  Awake Alert, Oriented X 3, No new F.N deficits, Normal  affect [place].AT,PERRAL, L sided throat swelling, enlarged submandibular nodes L>R Supple Neck,No JVD, No cervical lymphadenopathy appriciated.  Symmetrical Chest wall movement, Good air movement bilaterally, CTAB RRR,No Gallops,Rubs or new Murmurs, No Parasternal Heave +ve B.Sounds, Abd Soft, No tenderness, No organomegaly appriciated, No rebound - guarding or rigidity. No Cyanosis, Clubbing or edema, No new Rash or bruise      Data Review:    CBC  Recent Labs Lab 07/01/15 1926 07/02/15 0319 07/02/15 2127 07/03/15 0556  WBC 5.1 3.9* 4.5 4.2  HGB 10.7* 9.4* 9.8* 8.3*  HCT 35.6* 29.8* 31.9* 25.7*  PLT 163 117* 137* 102*  MCV 81.7 79.9 79.9 80.6  MCH 24.5* 25.2* 24.6* 26.0  MCHC 30.1 31.5 30.7 32.3  RDW 14.4 14.4 14.2 14.5  LYMPHSABS 0.7  --  0.9  --   MONOABS 0.4  --  0.3  --   EOSABS 0.0  --  0.0  --   BASOSABS 0.0  --  0.0  --     Chemistries   Recent Labs Lab 07/01/15 1926 07/02/15 0319 07/02/15 2127 07/03/15 0556  NA 140 139 140 139  K 3.4* 3.7 3.1* 3.2*  CL 107 109 108 109  CO2 23 20* 20* 20*  GLUCOSE 125* 115* 102* 98  BUN 15 14 12 10   CREATININE 1.16 1.03 0.96 0.88  CALCIUM 8.8* 8.2* 8.6* 7.7*  AST 25  --  26  --   ALT 15*  --  14*  --   ALKPHOS 53  --  50  --   BILITOT 0.5  --  0.6  --    ------------------------------------------------------------------------------------------------------------------ No results for input(s): CHOL, HDL, LDLCALC, TRIG, CHOLHDL, LDLDIRECT in the last 72 hours.  Lab Results  Component Value Date   HGBA1C 5.6 02/13/2014   ------------------------------------------------------------------------------------------------------------------ No results for input(s): TSH, T4TOTAL, T3FREE, THYROIDAB in the last 72 hours.  Invalid input(s): FREET3 ------------------------------------------------------------------------------------------------------------------ No results for input(s): VITAMINB12, FOLATE, FERRITIN, TIBC, IRON,  RETICCTPCT in the last 72 hours.  Coagulation profile  Recent Labs Lab 07/03/15 0127  INR 1.20    No results for input(s): DDIMER in the last 72 hours.  Cardiac Enzymes No results for input(s): CKMB, TROPONINI, MYOGLOBIN in the last 168 hours.  Invalid input(s): CK ------------------------------------------------------------------------------------------------------------------    Component Value Date/Time   BNP 103.3* 04/11/2014 2310    Inpatient Medications  Scheduled Meds: . fludrocortisone  0.1 mg Oral BID  . guaiFENesin-codeine  10 mL Oral Q6H  . midodrine  2.5 mg Oral TID WC  . pantoprazole  40 mg Oral BID  . potassium chloride  40 mEq Oral Daily  . potassium chloride  40 mEq Oral Once  . rosuvastatin  40 mg Oral QHS  . senna  1 tablet Oral BID  . senna-docusate  1 tablet Oral BID  . sertraline  50 mg Oral Daily   Continuous Infusions: . ampicillin-sulbactam (UNASYN) IV Stopped (07/03/15 0456)  . vancomycin     PRN Meds:.ALPRAZolam, HYDROcodone-acetaminophen, Melatonin, morphine injection, nitroGLYCERIN, tiZANidine  Micro Results Recent Results (from the past 240 hour(s))  Culture, group A strep     Status: None (Preliminary result)   Collection Time: 07/01/15  7:31 PM  Result Value Ref Range Status   Specimen Description THROAT  Final   Special Requests NONE  Final   Culture CULTURE REINCUBATED FOR BETTER GROWTH  Final   Report Status PENDING  Incomplete  Blood culture (routine x 2)     Status: None (Preliminary result)   Collection Time: 07/01/15  8:07 PM  Result Value Ref Range Status   Specimen Description BLOOD RIGHT ANTECUBITAL  Final   Special Requests BOTTLES DRAWN AEROBIC AND ANAEROBIC 5CC  Final   Culture  Setup Time   Final    GRAM POSITIVE COCCI IN CLUSTERS CRITICAL RESULT CALLED TO, READ BACK BY AND VERIFIED WITH: DR. Candiss Norse AT [id number] ON [id number] BY S. YARBROUGH IN BOTH AEROBIC AND ANAEROBIC BOTTLES    Culture NO GROWTH < 24 HOURS  Final    Report Status PENDING  Incomplete  Urine culture     Status: None (Preliminary result)   Collection Time: 07/01/15  8:10 PM  Result Value Ref Range Status   Specimen Description URINE, CLEAN CATCH  Final   Special Requests NONE  Final   Culture TOO YOUNG TO READ  Final   Report Status PENDING  Incomplete  Blood culture (routine x 2)     Status: None (Preliminary result)   Collection Time: 07/01/15  9:23 PM  Result Value Ref Range Status   Specimen Description BLOOD RIGHT FOREARM  Final   Special Requests BOTTLES DRAWN AEROBIC AND ANAEROBIC 5CC  Final   Culture NO GROWTH < 24 HOURS  Final   Report Status PENDING  Incomplete    Radiology Reports Dg Chest 2 View  07/01/2015  CLINICAL DATA:  Cough and fever EXAM: CHEST  2 VIEW COMPARISON:  04/07/2014 FINDINGS: Cardiomegaly with negative aortic and hilar contours. Status post CABG. Low volume chest with interstitial crowding. There is  no edema, consolidation, effusion, or pneumothorax. Implantable loop recorder. 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 Patient has previous cervical spine CT imaging in 2016. IMPRESSION: Low volume chest without acute finding. Electronically Signed   By: Monte Fantasia M.D.   On: 07/01/2015 20:46   Ct Soft Tissue Neck W Contrast  07/02/2015  CLINICAL DATA:  Initial evaluation for acute left-sided neck swelling with sore throat. EXAM: CT NECK WITH CONTRAST TECHNIQUE: Multidetector CT imaging of the neck was performed using the standard protocol following the bolus administration of intravenous contrast. CONTRAST:  1 ISOVUE-300 IOPAMIDOL (ISOVUE-300) INJECTION 61% COMPARISON:  None. FINDINGS: Visualized portions of the brain demonstrate no acute process. Encephalomalacia noted within the anterior right temporal pole. Partially visualized globes and orbits demonstrate no acute abnormality. Scattered mucosal thickening within the max O sinuses and ethmoidal air cells. Fluid level within the  right sphenoid sinus. Mucosal thickening within the left sphenoid sinus. Mastoid air cells are clear. Middle ear cavities are clear. Salivary glands including the parotid glands and submandibular glands are normal. Oral cavity is unremarkable, although evaluation somewhat limited by streak artifact from dental amalgam. No acute abnormality about the dentition. Palatine tonsils are mildly prominent and hyper enhancing, which may reflect acute tonsillitis. No peritonsillar abscess or other collection. Epiglottis normal. Vallecula clear. No retropharyngeal fluid collection or edema. Remainder of the hypopharynx and supraglottic larynx within normal limits without acute inflammatory changes. True cords grossly normal. Subglottic airway clear. Approximately 2 cm hypodense nodule with internal calcification present within the inferior left thyroid lobe, indeterminate. Thyroid otherwise within normal limits. Enlarged left level 2 lymph nodes measure up to 13 mm. A few of these nodes are somewhat heterogeneous and partially hypodense in appearance. Increased number of left level 2 P and level 5 nodes as compared to the right. These measure up to 15 mm. Increased number of subcentimeter left level 2/3 nodes as well. There is associated inflammatory stranding within the adjacent fat within the is area of adenopathy in the left neck. Finding is indeterminate, and could reflect supper node adenitis or reactive adenopathy. Nodal malignancy could also have this appearance. No significant adenopathy seen within the right neck. Visualized superior mediastinum within normal limits. Visualized lungs are clear. Normal intravascular enhancement seen throughout the neck. No acute osseous abnormality. No worrisome lytic or blastic osseous lesions. Prominent bridging anterior osteophytes seen throughout the cervical spine. IMPRESSION: 1. Left-sided cervical adenopathy with associated inflammatory stranding as detailed above,  indeterminate, but may reflect sequela of suppurative adenitis. Alternatively, these nodes may be reactive in nature. Nodal malignancy could also have this appearance. Clinical followup to resolution is recommended. 2. Question mild enlargement and hyper enhancement of the palatine tonsils, which may reflect acute tonsillitis. Correlation with physical exam and direct visualization recommended. 3. Approximately 2 cm left thyroid nodule, indeterminate. Correlation with dedicated thyroid ultrasound recommended. This could be performed on a nonemergent basis. 4. Prominent anterior bridging osteophytic spurring throughout the cervical spine. 5. Acute on chronic paranasal sinus disease as above. Electronically Signed   By: Jeannine Boga M.D.   On: 07/02/2015 00:13   Ct Abdomen Pelvis W Contrast  07/03/2015  CLINICAL DATA:  Suprapubic abdominal pain since this morning. Nausea. Fever. Cough. EXAM: CT ABDOMEN AND PELVIS WITH CONTRAST TECHNIQUE: Multidetector CT imaging of the abdomen and pelvis was performed using the standard protocol following bolus administration of intravenous contrast. CONTRAST:  114mL ISOVUE-300 IOPAMIDOL (ISOVUE-300) INJECTION 61% COMPARISON:  12/23/2014 FINDINGS: Motion artifact limits evaluation  of lung bases. Postoperative changes in the mediastinum. Mild diffuse fatty infiltration of the liver. Scattered calcified granulomas in the liver and spleen. Fatty infiltration of the pancreas. Small cysts in the kidneys. No hydronephrosis. Nephrograms are symmetrical. Calcification of aorta without aneurysm. No adrenal gland nodules. Normal caliber IVC. No retroperitoneal lymphadenopathy. Stomach, small bowel, and colon are not abnormally distended. No free air or free fluid in the abdomen. Pelvis: Appendix is normal. Prostate gland is enlarged, measuring 5.8 cm diameter. The mid and inferior portions of the rectus abdominus muscles bilaterally are expanded with infiltration in the fat around  the rectus abdominus muscles and in the anterior low pelvis. This suggest rectus muscle hematomas, possibly due to muscle injury or tear. There is pooling of contrast material within the rectus abdominus muscles, more on the left. This may indicate active contrast extravasation. Bladder wall is not thickened. Postoperative changes in the right hip with intra medullary rod and compression bolt fixation. There is lucency around the visualized compression bolt and rod. This could indicate loosening of hardware. Degenerative changes in the spine and hips. Bilateral inguinal hernias containing fat. IMPRESSION: Bilateral inferior rectus abdominus muscle hematomas with soft tissue hematoma in the surrounding fat and anterior pelvic wall. Puddling of contrast material in the inferior rectus muscles bilaterally but greater on the left may indicate active extravasation. Additional incidental findings as discussed in the body of report. These results will be called to the ordering clinician or representative by the Radiologist Assistant, and communication documented in the PACS or zVision Dashboard. Electronically Signed   By: Lucienne Capers M.D.   On: 07/03/2015 01:18    Time Spent in minutes  25   SINGH,PRASHANT K M.D on 07/03/2015 at 9:08 AM  Between 7am to 7pm - Pager - [phone]  After 7pm go to [email or web address] - password [facility]  [facility] -  Office  [phone]

## 2015-07-03 NOTE — H&P (Signed)
[facility] History and Physical  Gregory Wiley [id number] DOB: 04-07-1950 DOA: 07/02/2015  PCP: [name], MD   Chief Complaint: Fever, abdominal pain, positive blood cultures  HPI: Gregory Wiley is a 65 y.o. gentleman with a history of CAD, HTN, HLD, adrenal insufficiency, PE (anticoagulated with Xarelto), prior CVA with left sided weakness, and OSA requiring CPAP who was admitted on 4/16 for management of acute tonsillitis.  The patient was placed on IV unasyn. Blood cultures were drawn.  He improved rapidly and was comfortable with discharge to home on Augmentin.  Post discharge, he was notified of positive blood cultures, 1 of 2 bottles.  It was felt that there was a strong likelihood that this finding would contamination, and there were no immediate plans to change treatment course while awaiting ID and sensitivities.  The patient was advised to return to the ED for fever.  He has developed fever and  Now complains of increased lower abdomen/pelvic pain.  He has had some nausea but no vomiting. No diarrhea.  No dysuria.  No LOC.  Repeat blood cultures and IV vancomycin ordered by the ED physician.  The patient was actually referred for admission before his CT A/P results were back.  We subsequently learned that the patient has acute bilateral inferior rectus abdominus muscle hematomas with a soft tissue hematoma surrouding the fat and anterior pelvic wall.  Puddling of contrast may represent active bleed.  ED attending was asked to request formal consult from General Surgery.  Dr. Ralene Ok is not recommending acute intervention tonight except abdominal binder (expecting that this type of bleed will be self-limited); we have been advised to call back in the morning to have the patient seen.  Review of Systems: 12 systems reviewed and negative except as stated in the HPI.  Past Medical History  Diagnosis Date  . Coronary artery disease     stent, then CABG 07/20/10  .  Hypertension   . Hyperlipidemia   . Obesity (BMI 30-39.9)   . H/O cardiomyopathy     ischemic, now last echo 07/30/11, EF 38%W  . Diverticulosis   . Internal hemorrhoids   . Tubular adenoma of colon   . Plantar fasciitis of left foot   . Basal cell carcinoma of skin of left ankle   . NSTEMI (non-ST elevated myocardial infarction) ([place])     NSTEMI- last cath 06/2011-Stent to LCX-DES, last nuc 07/30/11 low risk  . MI (myocardial infarction) ([place])     "I had 4 before I retired in 2006" (11/11/2014)  . Pulmonary embolism ([place]) 03/2014  . GERD (gastroesophageal reflux disease)   . Bleeding stomach ulcer 2015  . Daily headache     "for the past month" (11/11/2014)  . Stroke ([place]) 01/2014    "no control of LUE, can slightly LLE; memory problems since" (11/11/2014)  . Arthritis     "back" (11/11/2014)  . Gout   . Depression   . Pneumonia     hosp. 12/2014  . Anxiety   . History of hiatal hernia   . 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 OSA on CPAP     since July 2013- uses CPAP sometimes , states he has lost 147 lbs. since stroke & doesn't use the CPAP as much as he use to.   . Adrenal insufficiency ([place])   . Left hemiplegia [facility])    Past Surgical History  Procedure Laterality Date  . Coronary  artery bypass graft  07/20/2010     LIMA-LAD; VG-ACUTE MARG of RCA; Seq VG-distal RCA & then pda  . Coronary angioplasty with stent placement  07/01/2011    DES-Resolute to native LCX  . Coronary angioplasty with stent placement  12/01/2007    COMPLEX 5 LESION PCI INCLUDING CUTTING BALLOON AND 4 CYPHER DESs  . Knee arthroscopy Right   . Hand surgery Right     to take glass out  . Colonoscopy N/A 02/10/2013    Procedure: COLONOSCOPY;  Surgeon: Ladene Artist, MD;  Location: [facility] ENDOSCOPY;  Service: Endoscopy;  Laterality: N/A;  . Retinal detachment surgery Right   . Loop recorder implant  02/15/2014    MDT LINQ implanted by Dr Rayann Heman for cryptogenic stroke  . Tee  without cardioversion N/A 02/15/2014    Procedure: TRANSESOPHAGEAL ECHOCARDIOGRAM (TEE);  Surgeon: Josue Hector, MD;  Location: [facility] ENDOSCOPY;  Service: Cardiovascular;  Laterality: N/A;  . Left heart catheterization with coronary/graft angiogram  07/01/2011    Procedure: LEFT HEART CATHETERIZATION WITH Beatrix Fetters;  Surgeon: Sanda Klein, MD;  Location: [place] CATH LAB;  Service: Cardiovascular;;  . Percutaneous coronary stent intervention (pci-s) Right 07/01/2011    Procedure: PERCUTANEOUS CORONARY STENT INTERVENTION (PCI-S);  Surgeon: Sanda Klein, MD;  Location: [facility] CATH LAB;  Service: Cardiovascular;  Laterality: Right;  . Umbilical hernia repair  2006  . Hernia repair    . Basal cell carcinoma excision Left 2015    ankle  . Esophagogastroduodenoscopy  01/2014    gastric ulcer, erosive gastroduodenitis, H Pylori negative.   . Esophagogastroduodenoscopy (egd) with propofol N/A 11/14/2014    Procedure: ESOPHAGOGASTRODUODENOSCOPY (EGD) WITH PROPOFOL;  Surgeon: Milus Banister, MD;  Location: [place];  Service: Endoscopy;  Laterality: N/A;  . Intramedullary (im) nail intertrochanteric Right 02/07/2015    Procedure: INTRAMEDULLARY (IM) NAIL INTERTROCHANTRIC RIGHT HIP;  Surgeon: Renette Butters, MD;  Location: [place];  Service: Orthopedics;  Laterality: Right;  . 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      Allergies  Allergen Reactions  . Diphenhydramine Hcl Anaphylaxis  . Adhesive [Tape] Other (See Comments)    Tears skin - please use paper tape  Remote tobacco use.  No EtOH or illicit drug use.  He is married.  Family History  Problem Relation Age of Onset  . Diabetes type II Mother   . Coronary artery disease    . Colon cancer Neg Hx   . CAD Father 39     CABG, PCI, died at 51  . Heart attack Father 78  . Hypertension Brother   . Diabetes Brother   . Hyperlipidemia Brother   . Brain cancer Maternal Grandmother   . COPD Paternal Grandfather   . Other      denies family history of DVT/PE  . Stroke Neg Hx   . Other Neg Hx     adrenal dz   Prior to Admission medications   Medication Sig Start Date End Date Taking? Authorizing Provider  acetaminophen (TYLENOL) 325 MG tablet Take 1 tablet (325 mg total) by mouth every 6 (six) hours as needed for mild pain. 12/27/14  Yes Florencia Reasons, MD  ALPRAZolam Duanne Moron) 0.25 MG tablet Take 0.5 tablets (0.125 mg  total) by mouth every 8 (eight) hours as needed for anxiety. 04/14/14  Yes Tiffany L Reed, DO  aspirin EC 81 MG tablet Take 81 mg by mouth daily.   Yes Historical Provider, MD  colchicine 0.6 MG tablet Take 0.6 mg by mouth as needed (for gout). Reported on 05/26/2015 04/09/14  Yes Historical Provider, MD  Fexofenadine HCl (ALLEGRA PO) Take 1 tablet by mouth daily as needed (seasonal allergies).   Yes Historical Provider, MD  fludrocortisone (FLORINEF) 0.1 MG tablet Take 1 tablet (0.1 mg total) by mouth 2 (two) times daily. 05/26/15  Yes Renato Shin, MD  fluticasone (FLONASE) 50 MCG/ACT nasal spray Place 2 sprays into both nostrils at bedtime as needed (seasonal allergies).    Yes Historical Provider, MD  HYDROcodone-acetaminophen (NORCO) 5-325 MG tablet Take 1-2 tablets by mouth every 6 (six) hours as needed for moderate pain. 05/15/15  Yes Britt Bottom, MD  hydrocortisone (ANUSOL-HC) 25 MG suppository Place 25 mg rectally as needed for hemorrhoids or itching.   Yes Historical Provider, MD  Melatonin 10 MG CAPS Take 10 mg by mouth at bedtime as needed (sleep).    Yes Historical Provider, MD  midodrine (PROAMATINE) 2.5 MG tablet Take 1 tablet (2.5 mg total) by mouth 3 (three) times daily with meals. 05/10/15  Yes Renato Shin, MD  nitroGLYCERIN (NITROSTAT) 0.4 MG SL tablet Place 1 tablet (0.4 mg total) under  the tongue every 5 (five) minutes as needed. For chest pain Patient taking differently: Place 0.4 mg under the tongue every 5 (five) minutes as needed for chest pain.  05/21/15  Yes Tonia Ghent, MD  pantoprazole (PROTONIX) 40 MG tablet Take 1 tablet (40 mg total) by mouth 2 (two) times daily. 04/05/15  Yes [name], MD  potassium chloride (K-DUR) 10 MEQ tablet TAKE 4 TABLETS (40 MEQ TOTAL) BY MOUTH DAILY. Patient taking differently: Take 40 mEq by mouth daily. TAKE 4 TABLETS (40 MEQ TOTAL) BY MOUTH DAILY 03/21/15  Yes Tonia Ghent, MD  PRESCRIPTION MEDICATION Inhale into the lungs at bedtime. CPAP   Yes Historical Provider, MD  rivaroxaban (XARELTO) 20 MG TABS tablet Take 1 tablet (20 mg total) by mouth daily with supper. 06/01/15  Yes Britt Bottom, MD  rosuvastatin (CRESTOR) 40 MG tablet Take 1 tablet (40 mg total) by mouth daily. Patient taking differently: Take 40 mg by mouth at bedtime.  06/30/15  Yes [name], MD  senna (SENOKOT) 8.6 MG TABS tablet Take 1 tablet by mouth 2 (two) times daily.   Yes Historical Provider, MD  senna-docusate (SENNA S) 8.6-50 MG tablet Take 1 tablet by mouth 2 (two) times daily.   Yes Historical Provider, MD  sertraline (ZOLOFT) 50 MG tablet Take 1 tablet (50 mg total) by mouth daily. 01/09/15  Yes Britt Bottom, MD  tiZANidine (ZANAFLEX) 4 MG tablet Take 4 mg by mouth 2 (two) times daily as needed for muscle spasms.  06/21/15  Yes Historical Provider, MD  amoxicillin-clavulanate (AUGMENTIN) 875-125 MG tablet Take 1 tablet by mouth every 12 (twelve) hours. Patient taking differently: Take 1 tablet by mouth every 12 (twelve) hours. 7 day course prescribed 07/02/15 07/02/15   Thurnell Lose, MD  methylPREDNISolone (MEDROL DOSEPAK) 4 MG TBPK tablet follow package directions 07/02/15   Thurnell Lose, MD   Physical Exam: Filed Vitals:   07/02/15 2327 07/02/15 2330 07/02/15 2331 07/02/15 2345  BP: 135/72 131/76  136/76  Pulse: 78 82 83 81  Temp:  TempSrc:      Resp: 16     SpO2: 96%  97% 93%     General:  Awake and alert.  Oriented to person, place, time and situation.  NAD.  Head: [place]/AT  Eyes: pupils equal bilaterally  ENT: Mucous membranes are slightly dry.  Cardiovascular: NR/RR.  No LE edema.  Respiratory: CTA bilaterally.  GI: Abdomen is soft and not distended.  He has pelvic fullness with guarding.  Bowel sounds are present.  Skin: Warm and dry.  Musculoskeletal: Moves all four extremities spontaneously.  Psychiatric: Normal affect.  Neurologic: No focal deficits.   Labs on Admission:  Basic Metabolic Panel:  Recent Labs Lab 07/01/15 1926 07/02/15 0319 07/02/15 2127  NA 140 139 140  K 3.4* 3.7 3.1*  CL 107 109 108  CO2 23 20* 20*  GLUCOSE 125* 115* 102*  BUN 15 14 12   CREATININE 1.16 1.03 0.96  CALCIUM 8.8* 8.2* 8.6*   Liver Function Tests:  Recent Labs Lab 07/01/15 1926 07/02/15 2127  AST 25 26  ALT 15* 14*  ALKPHOS 53 50  BILITOT 0.5 0.6  PROT 7.0 6.4*  ALBUMIN 3.4* 3.1*   CBC:  Recent Labs Lab 07/01/15 1926 07/02/15 0319 07/02/15 2127  WBC 5.1 3.9* 4.5  NEUTROABS 4.0  --  3.3  HGB 10.7* 9.4* 9.8*  HCT 35.6* 29.8* 31.9*  MCV 81.7 79.9 79.9  PLT 163 117* 137*    Radiological Exams on Admission: Dg Chest 2 View  07/01/2015  CLINICAL DATA:  Cough and fever EXAM: CHEST  2 VIEW COMPARISON:  04/07/2014 FINDINGS: Cardiomegaly with negative aortic and hilar contours. Status post CABG. Low volume chest with interstitial crowding. There is no edema, consolidation, effusion, or pneumothorax. Implantable loop recorder. 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 Patient has previous cervical spine CT imaging in 2016. IMPRESSION: Low volume chest without acute finding. Electronically Signed   By: Monte Fantasia M.D.   On: 07/01/2015 20:46   Ct Soft Tissue Neck W Contrast  07/02/2015  CLINICAL DATA:  Initial evaluation for acute left-sided neck swelling with  sore throat. EXAM: CT NECK WITH CONTRAST TECHNIQUE: Multidetector CT imaging of the neck was performed using the standard protocol following the bolus administration of intravenous contrast. CONTRAST:  1 ISOVUE-300 IOPAMIDOL (ISOVUE-300) INJECTION 61% COMPARISON:  None. FINDINGS: Visualized portions of the brain demonstrate no acute process. Encephalomalacia noted within the anterior right temporal pole. Partially visualized globes and orbits demonstrate no acute abnormality. Scattered mucosal thickening within the max O sinuses and ethmoidal air cells. Fluid level within the right sphenoid sinus. Mucosal thickening within the left sphenoid sinus. Mastoid air cells are clear. Middle ear cavities are clear. Salivary glands including the parotid glands and submandibular glands are normal. Oral cavity is unremarkable, although evaluation somewhat limited by streak artifact from dental amalgam. No acute abnormality about the dentition. Palatine tonsils are mildly prominent and hyper enhancing, which may reflect acute tonsillitis. No peritonsillar abscess or other collection. Epiglottis normal. Vallecula clear. No retropharyngeal fluid collection or edema. Remainder of the hypopharynx and supraglottic larynx within normal limits without acute inflammatory changes. True cords grossly normal. Subglottic airway clear. Approximately 2 cm hypodense nodule with internal calcification present within the inferior left thyroid lobe, indeterminate. Thyroid otherwise within normal limits. Enlarged left level 2 lymph nodes measure up to 13 mm. A few of these nodes are somewhat heterogeneous and partially hypodense in appearance. Increased number of left level 2 P and level 5 nodes  as compared to the right. These measure up to 15 mm. Increased number of subcentimeter left level 2/3 nodes as well. There is associated inflammatory stranding within the adjacent fat within the is area of adenopathy in the left neck. Finding is  indeterminate, and could reflect supper node adenitis or reactive adenopathy. Nodal malignancy could also have this appearance. No significant adenopathy seen within the right neck. Visualized superior mediastinum within normal limits. Visualized lungs are clear. Normal intravascular enhancement seen throughout the neck. No acute osseous abnormality. No worrisome lytic or blastic osseous lesions. Prominent bridging anterior osteophytes seen throughout the cervical spine. IMPRESSION: 1. Left-sided cervical adenopathy with associated inflammatory stranding as detailed above, indeterminate, but may reflect sequela of suppurative adenitis. Alternatively, these nodes may be reactive in nature. Nodal malignancy could also have this appearance. Clinical followup to resolution is recommended. 2. Question mild enlargement and hyper enhancement of the palatine tonsils, which may reflect acute tonsillitis. Correlation with physical exam and direct visualization recommended. 3. Approximately 2 cm left thyroid nodule, indeterminate. Correlation with dedicated thyroid ultrasound recommended. This could be performed on a nonemergent basis. 4. Prominent anterior bridging osteophytic spurring throughout the cervical spine. 5. Acute on chronic paranasal sinus disease as above. Electronically Signed   By: Jeannine Boga M.D.   On: 07/02/2015 00:13   Ct Abdomen Pelvis W Contrast  07/03/2015  CLINICAL DATA:  Suprapubic abdominal pain since this morning. Nausea. Fever. Cough. EXAM: CT ABDOMEN AND PELVIS WITH CONTRAST TECHNIQUE: Multidetector CT imaging of the abdomen and pelvis was performed using the standard protocol following bolus administration of intravenous contrast. CONTRAST:  169mL ISOVUE-300 IOPAMIDOL (ISOVUE-300) INJECTION 61% COMPARISON:  12/23/2014 FINDINGS: Motion artifact limits evaluation of lung bases. Postoperative changes in the mediastinum. Mild diffuse fatty infiltration of the liver. Scattered calcified  granulomas in the liver and spleen. Fatty infiltration of the pancreas. Small cysts in the kidneys. No hydronephrosis. Nephrograms are symmetrical. Calcification of aorta without aneurysm. No adrenal gland nodules. Normal caliber IVC. No retroperitoneal lymphadenopathy. Stomach, small bowel, and colon are not abnormally distended. No free air or free fluid in the abdomen. Pelvis: Appendix is normal. Prostate gland is enlarged, measuring 5.8 cm diameter. The mid and inferior portions of the rectus abdominus muscles bilaterally are expanded with infiltration in the fat around the rectus abdominus muscles and in the anterior low pelvis. This suggest rectus muscle hematomas, possibly due to muscle injury or tear. There is pooling of contrast material within the rectus abdominus muscles, more on the left. This may indicate active contrast extravasation. Bladder wall is not thickened. Postoperative changes in the right hip with intra medullary rod and compression bolt fixation. There is lucency around the visualized compression bolt and rod. This could indicate loosening of hardware. Degenerative changes in the spine and hips. Bilateral inguinal hernias containing fat. IMPRESSION: Bilateral inferior rectus abdominus muscle hematomas with soft tissue hematoma in the surrounding fat and anterior pelvic wall. Puddling of contrast material in the inferior rectus muscles bilaterally but greater on the left may indicate active extravasation. Additional incidental findings as discussed in the body of report. These results will be called to the ordering clinician or representative by the Radiologist Assistant, and communication documented in the PACS or zVision Dashboard. Electronically Signed   By: Lucienne Capers M.D.   On: 07/03/2015 01:18   Assessment/Plan Principal Problem:   Rectus sheath hematoma Active Problems:   Chronic anticoagulation   Acute tonsillitis   Bacteremia due to Gram-positive bacteria   Abdominal  pain   Fever  Admit to stepdown unit for active bleeding  Rectus abdominus muscle hematomas --STOP Xarelto and aspirin for now --Type and crossmatch, anticipate he will need at least one transfusion.  Need to keep Hgb around 10 given his cardiac history. --General surgery consult requested.  Initial recommendations given as noted above.  Will need to request formal consultation again in the morning. --Abdominal binder  GPC bacteremia with fever, acute tonsillitis --Continue unasyn and vanc for now --Patient is not overtly septic --Lactic acid pending --Repeat blood cultures are pending  Chronic anticoagulation for a history of PE --Must hold Xarelto for now for active bleed  History of orthostasis --Continue midodrine  Adrenal insuffficiency --BP stable for now.  Low threshold for stress dose steroids.  Acute blood loss anemia --Will type and crossmatch two units of PRBCs, anticipate he will need transfusion  Code Status: FULL Family Communication: Wife at bedside Disposition Plan: Patient will be here at least two midnights this time  Time spent: 65 minutes  [employer] [facility]  07/03/2015, 1:26 AM

## 2015-07-03 NOTE — ED Notes (Signed)
Attempted report 

## 2015-07-04 ENCOUNTER — Inpatient Hospital Stay (HOSPITAL_COMMUNITY): Payer: Medicare Other

## 2015-07-04 DIAGNOSIS — I2699 Other pulmonary embolism without acute cor pulmonale: Secondary | ICD-10-CM

## 2015-07-04 LAB — BASIC METABOLIC PANEL
Anion gap: 11 (ref 5–15)
BUN: 8 mg/dL (ref 6–20)
CO2: 20 mmol/L — ABNORMAL LOW (ref 22–32)
Calcium: 7.8 mg/dL — ABNORMAL LOW (ref 8.9–10.3)
Chloride: 108 mmol/L (ref 101–111)
Creatinine, Ser: 0.92 mg/dL (ref 0.61–1.24)
GFR calc Af Amer: >60 mL/min (ref >60)
GFR calc non Af Amer: >60 mL/min (ref >60)
Glucose, Bld: 79 mg/dL (ref 65–99)
Potassium: 3.2 mmol/L — ABNORMAL LOW (ref 3.5–5.1)
Sodium: 139 mmol/L (ref 135–145)

## 2015-07-04 LAB — MAGNESIUM: Magnesium: 1.7 mg/dL (ref 1.7–2.4)

## 2015-07-04 LAB — CBC
HCT: 27.2 % — ABNORMAL LOW (ref 39.0–52.0)
Hemoglobin: 8.4 g/dL — ABNORMAL LOW (ref 13.0–17.0)
MCH: 24.9 pg — ABNORMAL LOW (ref 26.0–34.0)
MCHC: 30.9 g/dL (ref 30.0–36.0)
MCV: 80.7 fL (ref 78.0–100.0)
Platelets: 90 10*3/uL — ABNORMAL LOW (ref 150–400)
RBC: 3.37 MIL/uL — ABNORMAL LOW (ref 4.22–5.81)
RDW: 14.5 % (ref 11.5–15.5)
WBC: 3.7 10*3/uL — ABNORMAL LOW (ref 4.0–10.5)

## 2015-07-04 LAB — CULTURE, GROUP A STREP (THRC)

## 2015-07-04 MED ORDER — ONDANSETRON HCL 4 MG/2ML IJ SOLN
4.0000 mg | Freq: Four times a day (QID) | INTRAMUSCULAR | Status: DC | PRN
  Administered 2015-07-04: 4 mg via INTRAVENOUS
  Filled 2015-07-04: qty 2

## 2015-07-04 MED ORDER — MAGNESIUM SULFATE IN D5W 10-5 MG/ML-% IV SOLN
1.0000 g | Freq: Once | INTRAVENOUS | Status: AC
  Administered 2015-07-04: 1 g via INTRAVENOUS
  Filled 2015-07-04: qty 100

## 2015-07-04 MED ORDER — POTASSIUM CHLORIDE CRYS ER 20 MEQ PO TBCR
40.0000 meq | EXTENDED_RELEASE_TABLET | Freq: Once | ORAL | Status: AC
  Administered 2015-07-04: 40 meq via ORAL
  Filled 2015-07-04: qty 2

## 2015-07-04 NOTE — Evaluation (Signed)
Physical Therapy Evaluation Patient Details Name: Gregory Wiley MRN: [id number] DOB: 06-16-1950 Today's Date: 07/04/2015   History of Present Illness  Pt adm with rectus abdominus hematome. PMH - Rt CVA with residual lt weakness. PE, HTN, orhtostatic  Clinical Impression  Pt admitted with above diagnosis and presents to PT with functional limitations due to deficits listed below (See PT problem list). Pt needs skilled PT to maximize independence and safety to allow discharge to home with wife to assist. Pt has had a decline in bed mobility, transfers, and gait from his baseline with this illness. Pt feels wife able to assist him adequately at home and does not want HHPT. Will follow acutely.    Follow Up Recommendations No PT follow up (Pt doesn't want HHPT and feels wife can assist him adequatel)    Equipment Recommendations  None recommended by PT    Recommendations for Other Services       Precautions / Restrictions Precautions Precautions: Fall      Mobility  Bed Mobility Overal bed mobility: Needs Assistance Bed Mobility: Supine to Sit     Supine to sit: +2 for physical assistance;Mod assist     General bed mobility comments: Assist to move LLE and to elevate trunk into sitting. Pt reports lightheadedness with sitting. BP 107/65  Transfers Overall transfer level: Needs assistance Equipment used: Quad cane Transfers: Sit to/from [facility] Sit to Stand: +2 physical assistance;Min assist Stand pivot transfers: +2 physical assistance;Min assist       General transfer comment: Assist to bring hips up and for placement of lt foot. Assist for support as pt performed pivotal steps. Difficulty moving RLE due to pt unable to bear sufficient weight on LLE. Pt reports lightheadedness with standing  Ambulation/Gait                Stairs            Wheelchair Mobility    Modified Rankin (Stroke Patients Only)       Balance Overall balance  assessment: Needs assistance Sitting-balance support: No upper extremity supported;Feet supported Sitting balance-Leahy Scale: Fair     Standing balance support: Single extremity supported Standing balance-Leahy Scale: Poor Standing balance comment: Hemiwalker and +2 min assist                             Pertinent Vitals/Pain Pain Assessment: Faces Faces Pain Scale: Hurts even more Pain Location: abdomen with coughing Pain Descriptors / Indicators: Grimacing;Guarding Pain Intervention(s): Limited activity within patient's tolerance;Monitored during session;Repositioned    Home Living Family/patient expects to be discharged to:: Private residence Living Arrangements: Spouse/significant other Available Help at Discharge: Family Type of Home: House Home Access: Ramped entrance     Home Layout: One level Home Equipment: Cane - quad;Bedside commode;Shower seat;Wheelchair - Education officer, community - power;Hospital bed (lift chair and lt AFO) Additional Comments: Wife has assisted pt in the past with mobility    Prior Function Level of Independence: Needs assistance   Gait / Transfers Assistance Needed: Reports had reached modified independent with transfers and short ambulation           Hand Dominance   Dominant Hand: Right    Extremity/Trunk Assessment   Upper Extremity Assessment: LUE deficits/detail       LUE Deficits / Details: No active movement due to old CVA. Pt with flexion contracture   Lower Extremity Assessment: LLE deficits/detail   LLE Deficits / Details:  Residual weakness from old CVA. Strength - ankle 0/5. hip and knee <3/5     Communication   Communication: No difficulties  Cognition Arousal/Alertness: Awake/alert Behavior During Therapy: WFL for tasks assessed/performed Overall Cognitive Status: Within Functional Limits for tasks assessed                      General Comments      Exercises        Assessment/Plan     PT Assessment Patient needs continued PT services  PT Diagnosis Difficulty walking;Abnormality of gait;Hemiplegia non-dominant side   PT Problem List Decreased strength;Decreased activity tolerance;Decreased balance;Decreased mobility;Impaired tone  PT Treatment Interventions DME instruction;Gait training;Functional mobility training;Therapeutic activities;Therapeutic exercise;Balance training;Patient/family education   PT Goals (Current goals can be found in the Care Plan section) Acute Rehab PT Goals Patient Stated Goal: return home PT Goal Formulation: With patient Time For Goal Achievement: 07/11/15 Potential to Achieve Goals: Good    Frequency Min 3X/week   Barriers to discharge        Co-evaluation               End of Session Equipment Utilized During Treatment: Gait belt Activity Tolerance: Treatment limited secondary to medical complications (Comment) Patient left: in chair;with call bell/phone within reach;with chair alarm set Nurse Communication: Mobility status;Need for lift equipment         Time: 1540-1605 PT Time Calculation (min) (ACUTE ONLY): 25 min   Charges:   PT Evaluation $PT Eval Moderate Complexity: 1 Procedure PT Treatments $Therapeutic Activity: 8-22 mins   PT G Codes:        Gregory Wiley 2015/07/17, 6:32 PM [facility] PT [phone]

## 2015-07-04 NOTE — Progress Notes (Signed)
VASCULAR LAB PRELIMINARY  PRELIMINARY  PRELIMINARY  PRELIMINARY  Bilateral lower extremity venous duplex completed.    Bilateral:  No evidence of DVT, superficial thrombosis, or Baker's Cyst.   Janifer Adie, RVT, RDMS 07/04/2015, 2:22 PM

## 2015-07-04 NOTE — Care Management Note (Addendum)
Case Management Note  Patient Details  Name: Gregory Wiley MRN: [id number] Date of Birth: March 09, 1951  Subjective/Objective:                 Spoke with patient and the wife at bedside. Patient comes in after having tonsilitis and positive blood cultures (possibly contaminate) and fever with abd pain. 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 CT showed free contrast and hematoma in abd. Receiving IV Abx. Patient independent prior to admission. States he does not want any HH PT, as he has tried it before and it does not give him much improvement.    Action/Plan:  Will continue to follow for DC needs.  Expected Discharge Date:                  Expected Discharge Plan:  Home/Self Care  In-House Referral:     Discharge planning Services  CM Consult  Post Acute Care Choice:  NA Choice offered to:  Patient  DME Arranged:    DME Agency:     HH Arranged:    [facility] Agency:     Status of Service:  Completed, signed off  Medicare Important Message Given:    Date Medicare IM Given:    Medicare IM give by:    Date Additional Medicare IM Given:    Additional Medicare Important Message give by:     If discussed at [place] of Stay Meetings, dates discussed:    Additional Comments:  Carles Collet, RN 07/04/2015, 1:42 PM

## 2015-07-04 NOTE — Progress Notes (Addendum)
PROGRESS NOTE                                                                                                                                                                                                             Patient Demographics:    Gregory Wiley, is a 65 y.o. male, DOB - 25-Sep-1950, [id number]  Admit date - 07/02/2015   Admitting Physician Thurnell Lose, MD  Outpatient Primary MD for the patient is Gregory Stain, MD  LOS - 1  Outpatient Specialists:   Chief Complaint  Patient presents with  . Fever  . Nausea  . Cough       Brief Narrative      Subjective:    Gregory Wiley today has, No headache, No chest pain, No abdominal pain - No Nausea, No new weakness tingling or numbness, No Cough - SOB. Dull generalized abdominal pain   Assessment  & Plan :   1.Rectus sheath hematoma with blood loss related anemia. Caused due to persistent cough with musculoskeletal strain in the setting of steroid to use. Hold xaralto, cough suppressant, supportive care, monitor H&H, type and screen. Discussed with both pulmonary physician on call Dr. Elsworth Soho and hematologist Dr. Beryle Beams, he will benefit from low-dose xaralto 10 mg daily once bleeding is stable likely 7-10 days, risk of stroke and DVT PE during this period has been explained to patient and wife bedside, they accept the risks and benefits, agree and understand the plan.  2. Possible acute tonsillitis from strep Throat with reactive cervical submandibular lymphadenopathy left greater than right with bacteremia versus contamination - had shown much improvement with Unasyn/Augmentin which will be continued, last admission one out of 2 sets of blood cultures were ordered, one bottle from the first set is growing coag-negative staph, one bottle out of 2 from the second culture is growing gram-positive cocci as well, I talked to microbiology lab on 07/04/2015. Blood  cultures drawn this admission on 07/03/2015 it does for negative.   For now I will continue Vancomycin and Unasyn till all cultures have finalized, patient currently is afebrile, nontoxic and symptom-free.   Once discharged a needs to follow-up with ENT one time in 1-2 weeks till his lymphadenopathy and left-sided neck swelling has resolved.    3. History of adrenal insufficiency with hypotension in the ER. Resolved with IV  fluids, he is already on midodrine and Florinef which will be continued, BP stable.  4. 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 Holding anticoagulation for now due to rectus sheath bleed, will monitor closely. SCDs for now. We'll stable baseline lower extremity venous duplex.  5. OSA. CPAP at night continue.  6. History of CVA with residual left-sided hemiparesis. At baseline.    Code Status : Full  Family Communication  : None present  Disposition Plan  : Stay inpatient  Barriers For Discharge : Rectus sheath bleed  Consults  :  CCS over the phone by admitting MD -  have nothing to offer  Procedures  :   CT Neck - consistent of tonsillitis and enlarged submandibular lymph nodes  CT scan abdomen and pelvis. Showing fatty liver along with rectus sheath hematoma  Bilateral lower extremity venous duplex - Bilateral: No evidence of DVT, superficial thrombosis, or Baker's Cyst.    DVT Prophylaxis  :  SCDs    Lab Results  Component Value Date   PLT 90* 07/04/2015    Antibiotics  :    Anti-infectives    Start     Dose/Rate Route Frequency Ordered Stop   07/03/15 1430  vancomycin (VANCOCIN) 1,250 mg in sodium chloride 0.9 % 250 mL IVPB     1,250 mg 166.7 mL/hr over 90 Minutes Intravenous Every 12 hours 07/03/15 1418     07/03/15 1100  vancomycin (VANCOCIN) 1,250 mg in sodium chloride 0.9 % 250 mL IVPB  Status:  Discontinued     1,250 mg 166.7 mL/hr over 90 Minutes Intravenous Every 12 hours 07/02/15 2329 07/03/15 1418   07/03/15 0130   Ampicillin-Sulbactam (UNASYN) 3 g in sodium chloride 0.9 % 100 mL IVPB     3 g 100 mL/hr over 60 Minutes Intravenous Every 6 hours 07/03/15 0121     07/02/15 2315  vancomycin (VANCOCIN) 2,000 mg in sodium chloride 0.9 % 500 mL IVPB     2,000 mg 250 mL/hr over 120 Minutes Intravenous  Once 07/02/15 2311 07/03/15 0344        Objective:   Filed Vitals:   07/03/15 1635 07/03/15 2128 07/03/15 2235 07/04/15 0436  BP:  131/64  133/69  Pulse:  80 82 91  Temp:  100.3 F (37.9 C)  98.4 F (36.9 C)  TempSrc:      Resp:  18 18 18   Height: 6' (1.829 m)     Weight: 97.16 kg (214 lb 3.2 oz)     SpO2:  93% 94% 91%    Wt Readings from Last 3 Encounters:  07/03/15 97.16 kg (214 lb 3.2 oz)  07/02/15 92.216 kg (203 lb 4.8 oz)  05/26/15 97.977 kg (216 lb)     Intake/Output Summary (Last 24 hours) at 07/04/15 1118 Last data filed at 07/04/15 0435  Gross per 24 hour  Intake    120 ml  Output   1000 ml  Net   -880 ml     Physical Exam  Awake Alert, Oriented X 3, No new F.N deficits, Normal affect Lamb.AT,PERRAL, L sided throat swelling, enlarged submandibular nodes L>R Supple Neck,No JVD, No cervical lymphadenopathy appriciated.  Symmetrical Chest wall movement, Good air movement bilaterally, CTAB RRR,No Gallops,Rubs or new Murmurs, No Parasternal Heave +ve B.Sounds, Abd Soft, No tenderness, No organomegaly appriciated, No rebound - guarding or rigidity. No Cyanosis, Clubbing or edema, No new Rash or bruise      Data Review:    CBC  Recent Labs Lab  07/01/15 1926 07/02/15 0319 07/02/15 2127 07/03/15 0556 07/03/15 1503 07/04/15 0835  WBC 5.1 3.9* 4.5 4.2  --  3.7*  HGB 10.7* 9.4* 9.8* 8.3* 8.7* 8.4*  HCT 35.6* 29.8* 31.9* 25.7* 28.5* 27.2*  PLT 163 117* 137* 102*  --  90*  MCV 81.7 79.9 79.9 80.6  --  80.7  MCH 24.5* 25.2* 24.6* 26.0  --  24.9*  MCHC 30.1 31.5 30.7 32.3  --  30.9  RDW 14.4 14.4 14.2 14.5  --  14.5  LYMPHSABS 0.7  --  0.9  --   --   --   MONOABS 0.4  --   0.3  --   --   --   EOSABS 0.0  --  0.0  --   --   --   BASOSABS 0.0  --  0.0  --   --   --     Chemistries   Recent Labs Lab 07/01/15 1926 07/02/15 0319 07/02/15 2127 07/03/15 0556 07/04/15 0835  NA 140 139 140 139 139  K 3.4* 3.7 3.1* 3.2* 3.2*  CL 107 109 108 109 108  CO2 23 20* 20* 20* 20*  GLUCOSE 125* 115* 102* 98 79  BUN 15 14 12 10 8   CREATININE 1.16 1.03 0.96 0.88 0.92  CALCIUM 8.8* 8.2* 8.6* 7.7* 7.8*  MG  --   --   --   --  1.7  AST 25  --  26  --   --   ALT 15*  --  14*  --   --   ALKPHOS 53  --  50  --   --   BILITOT 0.5  --  0.6  --   --    ------------------------------------------------------------------------------------------------------------------ No results for input(s): CHOL, HDL, LDLCALC, TRIG, CHOLHDL, LDLDIRECT in the last 72 hours.  Lab Results  Component Value Date   HGBA1C 5.6 02/13/2014   ------------------------------------------------------------------------------------------------------------------ No results for input(s): TSH, T4TOTAL, T3FREE, THYROIDAB in the last 72 hours.  Invalid input(s): FREET3 ------------------------------------------------------------------------------------------------------------------ No results for input(s): VITAMINB12, FOLATE, FERRITIN, TIBC, IRON, RETICCTPCT in the last 72 hours.  Coagulation profile  Recent Labs Lab 07/03/15 0127  INR 1.20    No results for input(s): DDIMER in the last 72 hours.  Cardiac Enzymes No results for input(s): CKMB, TROPONINI, MYOGLOBIN in the last 168 hours.  Invalid input(s): CK ------------------------------------------------------------------------------------------------------------------    Component Value Date/Time   BNP 103.3* 04/11/2014 2310    Inpatient Medications  Scheduled Meds: . ampicillin-sulbactam (UNASYN) IV  3 g Intravenous Q6H  . fludrocortisone  0.1 mg Oral BID  . guaiFENesin-codeine  10 mL Oral Q6H  . magnesium sulfate 1 - 4 g bolus  IVPB  1 g Intravenous Once  . midodrine  2.5 mg Oral TID WC  . pantoprazole  40 mg Oral BID  . potassium chloride SA  40 mEq Oral Daily  . potassium chloride  40 mEq Oral Once  . rosuvastatin  40 mg Oral QHS  . senna  1 tablet Oral BID  . senna-docusate  1 tablet Oral BID  . sertraline  50 mg Oral Daily  . vancomycin  1,250 mg Intravenous Q12H   Continuous Infusions:   PRN Meds:.ALPRAZolam, HYDROcodone-acetaminophen, morphine injection, nitroGLYCERIN, tiZANidine  Micro Results Recent Results (from the past 240 hour(s))  Culture, group A strep     Status: None (Preliminary result)   Collection Time: 07/01/15  7:31 PM  Result Value Ref Range Status   Specimen Description THROAT  Final  Special Requests NONE  Final   Culture CULTURE REINCUBATED FOR BETTER GROWTH  Final   Report Status PENDING  Incomplete  Blood culture (routine x 2)     Status: Abnormal   Collection Time: 07/01/15  8:07 PM  Result Value Ref Range Status   Specimen Description BLOOD RIGHT ANTECUBITAL  Final   Special Requests BOTTLES DRAWN AEROBIC AND ANAEROBIC 5CC  Final   Culture  Setup Time   Final    GRAM POSITIVE COCCI IN CLUSTERS CRITICAL RESULT CALLED TO, READ BACK BY AND VERIFIED WITH: DR. Candiss Norse AT [id number] ON [id number] BY S. YARBROUGH IN BOTH AEROBIC AND ANAEROBIC BOTTLES    Culture (A)  Final    STAPHYLOCOCCUS SPECIES (COAGULASE NEGATIVE) THE SIGNIFICANCE OF ISOLATING THIS ORGANISM FROM A SINGLE SET OF BLOOD CULTURES WHEN MULTIPLE SETS ARE DRAWN IS UNCERTAIN. PLEASE NOTIFY THE MICROBIOLOGY DEPARTMENT WITHIN ONE WEEK IF SPECIATION AND SENSITIVITIES ARE REQUIRED.    Report Status 07/03/2015 FINAL  Final  Urine culture     Status: None   Collection Time: 07/01/15  8:10 PM  Result Value Ref Range Status   Specimen Description URINE, CLEAN CATCH  Final   Special Requests NONE  Final   Culture MULTIPLE SPECIES PRESENT, SUGGEST RECOLLECTION  Final   Report Status 07/03/2015 FINAL  Final  Blood culture (routine  x 2)     Status: None (Preliminary result)   Collection Time: 07/01/15  9:23 PM  Result Value Ref Range Status   Specimen Description BLOOD RIGHT FOREARM  Final   Special Requests BOTTLES DRAWN AEROBIC AND ANAEROBIC 5CC  Final   Culture NO GROWTH 2 DAYS  Final   Report Status PENDING  Incomplete    Radiology Reports Dg Chest 2 View  07/01/2015  CLINICAL DATA:  Cough and fever EXAM: CHEST  2 VIEW COMPARISON:  04/07/2014 FINDINGS: Cardiomegaly with negative aortic and hilar contours. Status post CABG. Low volume chest with interstitial crowding. There is no edema, consolidation, effusion, or pneumothorax. Implantable loop recorder. 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 Patient has previous cervical spine CT imaging in 2016. IMPRESSION: Low volume chest without acute finding. Electronically Signed   By: Monte Fantasia M.D.   On: 07/01/2015 20:46   Ct Soft Tissue Neck W Contrast  07/02/2015  CLINICAL DATA:  Initial evaluation for acute left-sided neck swelling with sore throat. EXAM: CT NECK WITH CONTRAST TECHNIQUE: Multidetector CT imaging of the neck was performed using the standard protocol following the bolus administration of intravenous contrast. CONTRAST:  1 ISOVUE-300 IOPAMIDOL (ISOVUE-300) INJECTION 61% COMPARISON:  None. FINDINGS: Visualized portions of the brain demonstrate no acute process. Encephalomalacia noted within the anterior right temporal pole. Partially visualized globes and orbits demonstrate no acute abnormality. Scattered mucosal thickening within the max O sinuses and ethmoidal air cells. Fluid level within the right sphenoid sinus. Mucosal thickening within the left sphenoid sinus. Mastoid air cells are clear. Middle ear cavities are clear. Salivary glands including the parotid glands and submandibular glands are normal. Oral cavity is unremarkable, although evaluation somewhat limited by streak artifact from dental amalgam. No acute abnormality  about the dentition. Palatine tonsils are mildly prominent and hyper enhancing, which may reflect acute tonsillitis. No peritonsillar abscess or other collection. Epiglottis normal. Vallecula clear. No retropharyngeal fluid collection or edema. Remainder of the hypopharynx and supraglottic larynx within normal limits without acute inflammatory changes. True cords grossly normal. Subglottic airway clear. Approximately 2 cm hypodense nodule with internal calcification present within the  inferior left thyroid lobe, indeterminate. Thyroid otherwise within normal limits. Enlarged left level 2 lymph nodes measure up to 13 mm. A few of these nodes are somewhat heterogeneous and partially hypodense in appearance. Increased number of left level 2 P and level 5 nodes as compared to the right. These measure up to 15 mm. Increased number of subcentimeter left level 2/3 nodes as well. There is associated inflammatory stranding within the adjacent fat within the is area of adenopathy in the left neck. Finding is indeterminate, and could reflect supper node adenitis or reactive adenopathy. Nodal malignancy could also have this appearance. No significant adenopathy seen within the right neck. Visualized superior mediastinum within normal limits. Visualized lungs are clear. Normal intravascular enhancement seen throughout the neck. No acute osseous abnormality. No worrisome lytic or blastic osseous lesions. Prominent bridging anterior osteophytes seen throughout the cervical spine. IMPRESSION: 1. Left-sided cervical adenopathy with associated inflammatory stranding as detailed above, indeterminate, but may reflect sequela of suppurative adenitis. Alternatively, these nodes may be reactive in nature. Nodal malignancy could also have this appearance. Clinical followup to resolution is recommended. 2. Question mild enlargement and hyper enhancement of the palatine tonsils, which may reflect acute tonsillitis. Correlation with physical  exam and direct visualization recommended. 3. Approximately 2 cm left thyroid nodule, indeterminate. Correlation with dedicated thyroid ultrasound recommended. This could be performed on a nonemergent basis. 4. Prominent anterior bridging osteophytic spurring throughout the cervical spine. 5. Acute on chronic paranasal sinus disease as above. Electronically Signed   By: Jeannine Boga M.D.   On: 07/02/2015 00:13   Ct Abdomen Pelvis W Contrast  07/03/2015  CLINICAL DATA:  Suprapubic abdominal pain since this morning. Nausea. Fever. Cough. EXAM: CT ABDOMEN AND PELVIS WITH CONTRAST TECHNIQUE: Multidetector CT imaging of the abdomen and pelvis was performed using the standard protocol following bolus administration of intravenous contrast. CONTRAST:  130mL ISOVUE-300 IOPAMIDOL (ISOVUE-300) INJECTION 61% COMPARISON:  12/23/2014 FINDINGS: Motion artifact limits evaluation of lung bases. Postoperative changes in the mediastinum. Mild diffuse fatty infiltration of the liver. Scattered calcified granulomas in the liver and spleen. Fatty infiltration of the pancreas. Small cysts in the kidneys. No hydronephrosis. Nephrograms are symmetrical. Calcification of aorta without aneurysm. No adrenal gland nodules. Normal caliber IVC. No retroperitoneal lymphadenopathy. Stomach, small bowel, and colon are not abnormally distended. No free air or free fluid in the abdomen. Pelvis: Appendix is normal. Prostate gland is enlarged, measuring 5.8 cm diameter. The mid and inferior portions of the rectus abdominus muscles bilaterally are expanded with infiltration in the fat around the rectus abdominus muscles and in the anterior low pelvis. This suggest rectus muscle hematomas, possibly due to muscle injury or tear. There is pooling of contrast material within the rectus abdominus muscles, more on the left. This may indicate active contrast extravasation. Bladder wall is not thickened. Postoperative changes in the right hip with  intra medullary rod and compression bolt fixation. There is lucency around the visualized compression bolt and rod. This could indicate loosening of hardware. Degenerative changes in the spine and hips. Bilateral inguinal hernias containing fat. IMPRESSION: Bilateral inferior rectus abdominus muscle hematomas with soft tissue hematoma in the surrounding fat and anterior pelvic wall. Puddling of contrast material in the inferior rectus muscles bilaterally but greater on the left may indicate active extravasation. Additional incidental findings as discussed in the body of report. These results will be called to the ordering clinician or representative by the Radiologist Assistant, and communication documented in the PACS or zVision  Dashboard. Electronically Signed   By: Lucienne Capers M.D.   On: 07/03/2015 01:18    Time Spent in minutes  25   SINGH,PRASHANT K M.D on 07/04/2015 at 11:18 AM  Between 7am to 7pm - Pager - [phone]  After 7pm go to [email or web address] - password [facility]  [facility] -  Office  [phone]

## 2015-07-05 LAB — BASIC METABOLIC PANEL
Anion gap: 7 (ref 5–15)
BUN: 9 mg/dL (ref 6–20)
CO2: 24 mmol/L (ref 22–32)
Calcium: 7.8 mg/dL — ABNORMAL LOW (ref 8.9–10.3)
Chloride: 108 mmol/L (ref 101–111)
Creatinine, Ser: 0.74 mg/dL (ref 0.61–1.24)
GFR calc Af Amer: >60 mL/min (ref >60)
GFR calc non Af Amer: >60 mL/min (ref >60)
Glucose, Bld: 86 mg/dL (ref 65–99)
Potassium: 3.5 mmol/L (ref 3.5–5.1)
Sodium: 139 mmol/L (ref 135–145)

## 2015-07-05 LAB — CBC
HCT: 25.3 % — ABNORMAL LOW (ref 39.0–52.0)
Hemoglobin: 8 g/dL — ABNORMAL LOW (ref 13.0–17.0)
MCH: 25.5 pg — ABNORMAL LOW (ref 26.0–34.0)
MCHC: 31.6 g/dL (ref 30.0–36.0)
MCV: 80.6 fL (ref 78.0–100.0)
Platelets: 79 10*3/uL — ABNORMAL LOW (ref 150–400)
RBC: 3.14 MIL/uL — ABNORMAL LOW (ref 4.22–5.81)
RDW: 14.7 % (ref 11.5–15.5)
WBC: 3.5 10*3/uL — ABNORMAL LOW (ref 4.0–10.5)

## 2015-07-05 LAB — IRON AND TIBC
Iron: 12 ug/dL — ABNORMAL LOW (ref 45–182)
Saturation Ratios: 5 % — ABNORMAL LOW (ref 17.9–39.5)
TIBC: 218 ug/dL — ABNORMAL LOW (ref 250–450)
UIBC: 206 ug/dL

## 2015-07-05 LAB — FOLATE: Folate: 23.1 ng/mL (ref >5.9)

## 2015-07-05 LAB — MAGNESIUM: Magnesium: 1.9 mg/dL (ref 1.7–2.4)

## 2015-07-05 LAB — RETICULOCYTES
RBC.: 3.38 MIL/uL — ABNORMAL LOW (ref 4.22–5.81)
Retic Count, Absolute: 13.5 10*3/uL — ABNORMAL LOW (ref 19.0–186.0)
Retic Ct Pct: 0.4 % (ref 0.4–3.1)

## 2015-07-05 LAB — FERRITIN: Ferritin: 45 ng/mL (ref 24–336)

## 2015-07-05 LAB — VITAMIN B12: Vitamin B-12: 286 pg/mL (ref 180–914)

## 2015-07-05 NOTE — Progress Notes (Signed)
Pharmacy Antibiotic Note Gregory Wiley is a 65 y.o. male admitted early 4/16 for acute tonsillitis and subsequently discharged home on Augmentin that was called back to hospital 4/16 pm with GPC in clusters blood cx's. Pharmacy asked to dose vancomycin and Unasyn.  Plan: Continue vancomycin 1250 mg IV q12h Continue Unasyn 3 g IV q6h Check VT if continued and depending on final BCx results Monitor renal function   Temp (24hrs), Avg:98.2 F (36.8 C), Min:97.3 F (36.3 C), Max:99 F (37.2 C)   Recent Labs Lab 07/01/15 2018 07/02/15 0319 07/02/15 2127 07/03/15 0127 07/03/15 0556 07/04/15 0835 07/05/15 0655  WBC  --  3.9* 4.5  --  4.2 3.7* 3.5*  CREATININE  --  1.03 0.96  --  0.88 0.92 0.74  LATICACIDVEN 1.78  --   --  1.6  --   --   --     Estimated Creatinine Clearance: 111.2 mL/min (by C-G formula based on Cr of 0.74).    Antimicrobials this admission: 4/16 Vancomycin >> 4/17 Unasyn >>   Dose adjustments this admission: n/a  Microbiology results: 4/19 BCx: pending 4/17 Blood - 1/2 CoNS 4/15 Blood - 1/2 CoNS 4/15 Urine - multiple species, none predminant 4/15 GAS >> negative   Thank you for allowing pharmacy to be a part of this patient's care.  [place], Pharm.D., BCPS Clinical Pharmacist Pager: [phone] 07/05/2015 1:48 PM

## 2015-07-05 NOTE — Progress Notes (Signed)
Physical Therapy Treatment Patient Details Name: Gregory Wiley MRN: [id number] DOB: 27-Jun-1950 Today's Date: 07/05/2015    History of Present Illness Pt adm with rectus abdominus hematome. PMH - Rt CVA with residual lt weakness. PE, HTN, orhtostatic    PT Comments    Pt making steady progress. Lightheaded with amb. BP 116/72 in sitting after amb but no BP during gait.  Follow Up Recommendations  No PT follow up (Pt doesn't want HHPT and feels wife can assist him adequatel)     Equipment Recommendations  None recommended by PT    Recommendations for Other Services       Precautions / Restrictions Precautions Precautions: Fall    Mobility  Bed Mobility Overal bed mobility: Needs Assistance Bed Mobility: Supine to Sit     Supine to sit: +2 for physical assistance;Mod assist     General bed mobility comments: Assist to move LLE and to elevate trunk into sitting. Do to room set up getting pt OOB toward his rt which is more difficult for him than going to left so he is requiring more assistance  Transfers Overall transfer level: Needs assistance Equipment used: Quad cane Transfers: Sit to/from Stand Sit to Stand: +2 physical assistance;Min assist         General transfer comment: Assist to bring hips up and for balance. Pt able to get lt foot placed with incr time and effort  Ambulation/Gait Ambulation/Gait assistance: Min assist Ambulation Distance (Feet): 4 Feet Assistive device: Quad cane Gait Pattern/deviations: Step-to pattern;Decreased step length - left;Decreased dorsiflexion - left;Decreased weight shift to left Gait velocity: decr Gait velocity interpretation: Below normal speed for age/gender General Gait Details: Assist for balance and safety. Pt became light headed with amb so sat after a short distance   Science writer    Modified Rankin (Stroke Patients Only)       Balance Overall balance assessment: Needs  assistance Sitting-balance support: No upper extremity supported Sitting balance-Leahy Scale: Fair     Standing balance support: Single extremity supported Standing balance-Leahy Scale: Poor Standing balance comment: quad cane and min guard for static standing                    Cognition Arousal/Alertness: Awake/alert Behavior During Therapy: WFL for tasks assessed/performed Overall Cognitive Status: Within Functional Limits for tasks assessed                      Exercises      General Comments        Pertinent Vitals/Pain Pain Assessment: Faces Faces Pain Scale: Hurts little more Pain Location: abdomen Pain Descriptors / Indicators: Grimacing Pain Intervention(s): Limited activity within patient's tolerance;Monitored during session    Home Living                      Prior Function            PT Goals (current goals can now be found in the care plan section) Acute Rehab PT Goals Patient Stated Goal: return home Progress towards PT goals: Progressing toward goals    Frequency  Min 3X/week    PT Plan Current plan remains appropriate    Co-evaluation             End of Session Equipment Utilized During Treatment: Gait belt Activity Tolerance: Treatment limited secondary to medical complications (Comment) Patient left: in chair;with  call bell/phone within reach;with chair alarm set     Time: 1025-1044 PT Time Calculation (min) (ACUTE ONLY): 19 min  Charges:  $Gait Training: 8-22 mins                    G Codes:      Mickala Laton 07-16-15, 11:04 AM Suanne Marker PT [phone]

## 2015-07-05 NOTE — Progress Notes (Signed)
PROGRESS NOTE                                                                                                                                                                                                             Patient Demographics:    Gregory Wiley, is a 65 y.o. male, DOB - Nov 25, 1950, [id number]  Admit date - 07/02/2015   Admitting Physician Thurnell Lose, MD  Outpatient Primary MD for the patient is [name], MD  LOS - 2  Outpatient Specialists:   Chief Complaint  Patient presents with  . Fever  . Nausea  . Cough       Brief Narrative      Subjective:    Gregory Wiley today has, No headache, No chest pain, No abdominal pain - No Nausea, No new weakness tingling or numbness, No Cough - SOB. Dull generalized abdominal pain, overall feels ok.   Assessment  & Plan :   1.Rectus sheath hematoma with blood loss related anemia. Caused due to persistent cough with musculoskeletal strain in the setting of steroid to use. Hold xaralto, cough suppressant, supportive care, monitor H&H, type and screen. Discussed with both pulmonary physician on call Dr. Elsworth Soho and hematologist Dr. Beryle Beams, he will benefit from low-dose xaralto 10 mg daily once bleeding is stable likely 7-10 days, risk of stroke and DVT PE during this period has been explained to patient and wife bedside, they accept the risks and benefits, agree and understand the plan.  2. Possible acute tonsillitis from strep Throat with reactive cervical submandibular lymphadenopathy left greater than right with bacteremia versus contamination - had shown much improvement with Unasyn/Augmentin which will be continued, last admission one out of 2 sets of blood cultures were ordered, one bottle from the first set is growing coag-negative staph, one bottle out of 2 from the second culture is growing gram-positive cocci as well, I talked to microbiology lab on  07/04/2015. Blood cultures drawn this admission on 07/03/2015 it does for negative.   For now I will continue Vancomycin and Unasyn till all cultures have finalized, patient currently is afebrile, nontoxic and symptom-free.   Once discharged a needs to follow-up with ENT one time in 1-2 weeks till his lymphadenopathy and left-sided neck swelling has resolved.    3. History of adrenal insufficiency with hypotension in the ER.  Resolved with IV fluids, he is already on midodrine and Florinef which will be continued, BP stable.  4. 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 Holding anticoagulation for now due to rectus sheath bleed, will monitor closely. SCDs for now. We'll stable baseline lower extremity venous duplex.  5. OSA. CPAP at night continue.  6. History of CVA with residual left-sided hemiparesis. At baseline.  7. Chronic anemia with some worsening due to blood loss in the rectus sheath hematoma. We'll check anemia panel and monitor, goal will be to keep hemoglobin around 7.5.    Code Status : Full  Family Communication  : wife  Disposition Plan  : Home in the morning if hemoglobin stable  Barriers For Discharge : Rectus sheath bleed  Consults  :  [facility] over the phone by admitting MD -  have nothing to offer, Dr Beryle Beams Hematology over the phone by me x 2  Procedures  :   CT Neck - consistent of tonsillitis and enlarged submandibular lymph nodes  CT scan abdomen and pelvis. Showing fatty liver along with rectus sheath hematoma  Bilateral lower extremity venous duplex - Bilateral: No evidence of DVT, superficial thrombosis, or Baker's Cyst.    DVT Prophylaxis  :  SCDs    Lab Results  Component Value Date   PLT 79* 07/05/2015    Antibiotics  :    Anti-infectives    Start     Dose/Rate Route Frequency Ordered Stop   07/03/15 1430  vancomycin (VANCOCIN) 1,250 mg in sodium chloride 0.9 % 250 mL IVPB     1,250 mg 166.7 mL/hr over 90 Minutes Intravenous Every 12  hours 07/03/15 1418     07/03/15 1100  vancomycin (VANCOCIN) 1,250 mg in sodium chloride 0.9 % 250 mL IVPB  Status:  Discontinued     1,250 mg 166.7 mL/hr over 90 Minutes Intravenous Every 12 hours 07/02/15 2329 07/03/15 1418   07/03/15 0130  Ampicillin-Sulbactam (UNASYN) 3 g in sodium chloride 0.9 % 100 mL IVPB     3 g 100 mL/hr over 60 Minutes Intravenous Every 6 hours 07/03/15 0121     07/02/15 2315  vancomycin (VANCOCIN) 2,000 mg in sodium chloride 0.9 % 500 mL IVPB     2,000 mg 250 mL/hr over 120 Minutes Intravenous  Once 07/02/15 2311 07/03/15 0344        Objective:   Filed Vitals:   07/04/15 2231 07/04/15 2255 07/05/15 0527 07/05/15 0859  BP: 123/57  134/67 120/63  Pulse: 73 76 65 73  Temp: 99 F (37.2 C)     TempSrc: Oral     Resp: 18 18 18    Height:      Weight:      SpO2: 91% 92% 91%     Wt Readings from Last 3 Encounters:  07/03/15 97.16 kg (214 lb 3.2 oz)  07/02/15 92.216 kg (203 lb 4.8 oz)  05/26/15 97.977 kg (216 lb)     Intake/Output Summary (Last 24 hours) at 07/05/15 0938 Last data filed at 07/05/15 0859  Gross per 24 hour  Intake    810 ml  Output   1125 ml  Net   -315 ml     Physical Exam  Awake Alert, Oriented X 3, No new F.N deficits, Normal affect .AT,PERRAL, L sided throat swelling, enlarged submandibular nodes L>R Supple Neck,No JVD, No cervical lymphadenopathy appriciated.  Symmetrical Chest wall movement, Good air movement bilaterally, CTAB RRR,No Gallops,Rubs or new Murmurs, No Parasternal Heave +ve B.Sounds, Abd  Soft, No tenderness, No organomegaly appriciated, No rebound - guarding or rigidity. No Cyanosis, Clubbing or edema, No new Rash or bruise      Data Review:    CBC  Recent Labs Lab 07/01/15 1926 07/02/15 0319 07/02/15 2127 07/03/15 0556 07/03/15 1503 07/04/15 0835 07/05/15 0655  WBC 5.1 3.9* 4.5 4.2  --  3.7* 3.5*  HGB 10.7* 9.4* 9.8* 8.3* 8.7* 8.4* 8.0*  HCT 35.6* 29.8* 31.9* 25.7* 28.5* 27.2* 25.3*  PLT  163 117* 137* 102*  --  90* 79*  MCV 81.7 79.9 79.9 80.6  --  80.7 80.6  MCH 24.5* 25.2* 24.6* 26.0  --  24.9* 25.5*  MCHC 30.1 31.5 30.7 32.3  --  30.9 31.6  RDW 14.4 14.4 14.2 14.5  --  14.5 14.7  LYMPHSABS 0.7  --  0.9  --   --   --   --   MONOABS 0.4  --  0.3  --   --   --   --   EOSABS 0.0  --  0.0  --   --   --   --   BASOSABS 0.0  --  0.0  --   --   --   --     Chemistries   Recent Labs Lab 07/01/15 1926 07/02/15 0319 07/02/15 2127 07/03/15 0556 07/04/15 0835 07/05/15 0655  NA 140 139 140 139 139 139  K 3.4* 3.7 3.1* 3.2* 3.2* 3.5  CL 107 109 108 109 108 108  CO2 23 20* 20* 20* 20* 24  GLUCOSE 125* 115* 102* 98 79 86  BUN 15 14 12 10 8 9   CREATININE 1.16 1.03 0.96 0.88 0.92 0.74  CALCIUM 8.8* 8.2* 8.6* 7.7* 7.8* 7.8*  MG  --   --   --   --  1.7 1.9  AST 25  --  26  --   --   --   ALT 15*  --  14*  --   --   --   ALKPHOS 53  --  50  --   --   --   BILITOT 0.5  --  0.6  --   --   --    ------------------------------------------------------------------------------------------------------------------ No results for input(s): CHOL, HDL, LDLCALC, TRIG, CHOLHDL, LDLDIRECT in the last 72 hours.  Lab Results  Component Value Date   HGBA1C 5.6 02/13/2014   ------------------------------------------------------------------------------------------------------------------ No results for input(s): TSH, T4TOTAL, T3FREE, THYROIDAB in the last 72 hours.  Invalid input(s): FREET3 ------------------------------------------------------------------------------------------------------------------ No results for input(s): VITAMINB12, FOLATE, FERRITIN, TIBC, IRON, RETICCTPCT in the last 72 hours.  Coagulation profile  Recent Labs Lab 07/03/15 0127  INR 1.20    No results for input(s): DDIMER in the last 72 hours.  Cardiac Enzymes No results for input(s): CKMB, TROPONINI, MYOGLOBIN in the last 168 hours.  Invalid input(s):  CK ------------------------------------------------------------------------------------------------------------------    Component Value Date/Time   BNP 103.3* 04/11/2014 2310    Inpatient Medications  Scheduled Meds: . ampicillin-sulbactam (UNASYN) IV  3 g Intravenous Q6H  . fludrocortisone  0.1 mg Oral BID  . guaiFENesin-codeine  10 mL Oral Q6H  . midodrine  2.5 mg Oral TID WC  . pantoprazole  40 mg Oral BID  . potassium chloride SA  40 mEq Oral Daily  . rosuvastatin  40 mg Oral QHS  . senna  1 tablet Oral BID  . senna-docusate  1 tablet Oral BID  . sertraline  50 mg Oral Daily  . vancomycin  1,250 mg Intravenous  Q12H   Continuous Infusions:   PRN Meds:.ALPRAZolam, HYDROcodone-acetaminophen, morphine injection, nitroGLYCERIN, ondansetron (ZOFRAN) IV, tiZANidine  Micro Results Recent Results (from the past 240 hour(s))  Culture, group A strep     Status: None   Collection Time: 07/01/15  7:31 PM  Result Value Ref Range Status   Specimen Description THROAT  Final   Special Requests NONE  Final   Culture NO GROUP A STREP (S.PYOGENES) ISOLATED  Final   Report Status 07/04/2015 FINAL  Final  Blood culture (routine x 2)     Status: Abnormal   Collection Time: 07/01/15  8:07 PM  Result Value Ref Range Status   Specimen Description BLOOD RIGHT ANTECUBITAL  Final   Special Requests BOTTLES DRAWN AEROBIC AND ANAEROBIC 5CC  Final   Culture  Setup Time   Final    GRAM POSITIVE COCCI IN CLUSTERS CRITICAL RESULT CALLED TO, READ BACK BY AND VERIFIED WITH: DR. Candiss Norse AT [id number] ON [id number] BY S. YARBROUGH IN BOTH AEROBIC AND ANAEROBIC BOTTLES    Culture (A)  Final    STAPHYLOCOCCUS SPECIES (COAGULASE NEGATIVE) THE SIGNIFICANCE OF ISOLATING THIS ORGANISM FROM A SINGLE SET OF BLOOD CULTURES WHEN MULTIPLE SETS ARE DRAWN IS UNCERTAIN. PLEASE NOTIFY THE MICROBIOLOGY DEPARTMENT WITHIN ONE WEEK IF SPECIATION AND SENSITIVITIES ARE REQUIRED.    Report Status 07/03/2015 FINAL  Final  Urine  culture     Status: None   Collection Time: 07/01/15  8:10 PM  Result Value Ref Range Status   Specimen Description URINE, CLEAN CATCH  Final   Special Requests NONE  Final   Culture MULTIPLE SPECIES PRESENT, SUGGEST RECOLLECTION  Final   Report Status 07/03/2015 FINAL  Final  Blood culture (routine x 2)     Status: None (Preliminary result)   Collection Time: 07/01/15  9:23 PM  Result Value Ref Range Status   Specimen Description BLOOD RIGHT FOREARM  Final   Special Requests BOTTLES DRAWN AEROBIC AND ANAEROBIC 5CC  Final   Culture NO GROWTH 3 DAYS  Final   Report Status PENDING  Incomplete  Blood culture (routine x 2)     Status: None (Preliminary result)   Collection Time: 07/03/15  1:27 AM  Result Value Ref Range Status   Specimen Description BLOOD RIGHT HAND  Final   Special Requests IN PEDIATRIC BOTTLE 4CC  Final   Culture NO GROWTH 1 DAY  Final   Report Status PENDING  Incomplete    Radiology Reports Dg Chest 2 View  07/01/2015  CLINICAL DATA:  Cough and fever EXAM: CHEST  2 VIEW COMPARISON:  04/07/2014 FINDINGS: Cardiomegaly with negative aortic and hilar contours. Status post CABG. Low volume chest with interstitial crowding. There is no edema, consolidation, effusion, or pneumothorax. Implantable loop recorder. 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 Patient has previous cervical spine CT imaging in 2016. IMPRESSION: Low volume chest without acute finding. Electronically Signed   By: Monte Fantasia M.D.   On: 07/01/2015 20:46   Ct Soft Tissue Neck W Contrast  07/02/2015  CLINICAL DATA:  Initial evaluation for acute left-sided neck swelling with sore throat. EXAM: CT NECK WITH CONTRAST TECHNIQUE: Multidetector CT imaging of the neck was performed using the standard protocol following the bolus administration of intravenous contrast. CONTRAST:  1 ISOVUE-300 IOPAMIDOL (ISOVUE-300) INJECTION 61% COMPARISON:  None. FINDINGS: Visualized portions of the  brain demonstrate no acute process. Encephalomalacia noted within the anterior right temporal pole. Partially visualized globes and orbits demonstrate no acute abnormality. Scattered mucosal  thickening within the max O sinuses and ethmoidal air cells. Fluid level within the right sphenoid sinus. Mucosal thickening within the left sphenoid sinus. Mastoid air cells are clear. Middle ear cavities are clear. Salivary glands including the parotid glands and submandibular glands are normal. Oral cavity is unremarkable, although evaluation somewhat limited by streak artifact from dental amalgam. No acute abnormality about the dentition. Palatine tonsils are mildly prominent and hyper enhancing, which may reflect acute tonsillitis. No peritonsillar abscess or other collection. Epiglottis normal. Vallecula clear. No retropharyngeal fluid collection or edema. Remainder of the hypopharynx and supraglottic larynx within normal limits without acute inflammatory changes. True cords grossly normal. Subglottic airway clear. Approximately 2 cm hypodense nodule with internal calcification present within the inferior left thyroid lobe, indeterminate. Thyroid otherwise within normal limits. Enlarged left level 2 lymph nodes measure up to 13 mm. A few of these nodes are somewhat heterogeneous and partially hypodense in appearance. Increased number of left level 2 P and level 5 nodes as compared to the right. These measure up to 15 mm. Increased number of subcentimeter left level 2/3 nodes as well. There is associated inflammatory stranding within the adjacent fat within the is area of adenopathy in the left neck. Finding is indeterminate, and could reflect supper node adenitis or reactive adenopathy. Nodal malignancy could also have this appearance. No significant adenopathy seen within the right neck. Visualized superior mediastinum within normal limits. Visualized lungs are clear. Normal intravascular enhancement seen throughout the  neck. No acute osseous abnormality. No worrisome lytic or blastic osseous lesions. Prominent bridging anterior osteophytes seen throughout the cervical spine. IMPRESSION: 1. Left-sided cervical adenopathy with associated inflammatory stranding as detailed above, indeterminate, but may reflect sequela of suppurative adenitis. Alternatively, these nodes may be reactive in nature. Nodal malignancy could also have this appearance. Clinical followup to resolution is recommended. 2. Question mild enlargement and hyper enhancement of the palatine tonsils, which may reflect acute tonsillitis. Correlation with physical exam and direct visualization recommended. 3. Approximately 2 cm left thyroid nodule, indeterminate. Correlation with dedicated thyroid ultrasound recommended. This could be performed on a nonemergent basis. 4. Prominent anterior bridging osteophytic spurring throughout the cervical spine. 5. Acute on chronic paranasal sinus disease as above. Electronically Signed   By: Jeannine Boga M.D.   On: 07/02/2015 00:13   Ct Abdomen Pelvis W Contrast  07/03/2015  CLINICAL DATA:  Suprapubic abdominal pain since this morning. Nausea. Fever. Cough. EXAM: CT ABDOMEN AND PELVIS WITH CONTRAST TECHNIQUE: Multidetector CT imaging of the abdomen and pelvis was performed using the standard protocol following bolus administration of intravenous contrast. CONTRAST:  179mL ISOVUE-300 IOPAMIDOL (ISOVUE-300) INJECTION 61% COMPARISON:  12/23/2014 FINDINGS: Motion artifact limits evaluation of lung bases. Postoperative changes in the mediastinum. Mild diffuse fatty infiltration of the liver. Scattered calcified granulomas in the liver and spleen. Fatty infiltration of the pancreas. Small cysts in the kidneys. No hydronephrosis. Nephrograms are symmetrical. Calcification of aorta without aneurysm. No adrenal gland nodules. Normal caliber IVC. No retroperitoneal lymphadenopathy. Stomach, small bowel, and colon are not  abnormally distended. No free air or free fluid in the abdomen. Pelvis: Appendix is normal. Prostate gland is enlarged, measuring 5.8 cm diameter. The mid and inferior portions of the rectus abdominus muscles bilaterally are expanded with infiltration in the fat around the rectus abdominus muscles and in the anterior low pelvis. This suggest rectus muscle hematomas, possibly due to muscle injury or tear. There is pooling of contrast material within the rectus abdominus muscles, more on  the left. This may indicate active contrast extravasation. Bladder wall is not thickened. Postoperative changes in the right hip with intra medullary rod and compression bolt fixation. There is lucency around the visualized compression bolt and rod. This could indicate loosening of hardware. Degenerative changes in the spine and hips. Bilateral inguinal hernias containing fat. IMPRESSION: Bilateral inferior rectus abdominus muscle hematomas with soft tissue hematoma in the surrounding fat and anterior pelvic wall. Puddling of contrast material in the inferior rectus muscles bilaterally but greater on the left may indicate active extravasation. Additional incidental findings as discussed in the body of report. These results will be called to the ordering clinician or representative by the Radiologist Assistant, and communication documented in the PACS or zVision Dashboard. Electronically Signed   By: Lucienne Capers M.D.   On: 07/03/2015 01:18    Time Spent in minutes  25   Donn Zanetti K M.D on 07/05/2015 at 9:38 AM  Between 7am to 7pm - Pager - [phone]  After 7pm go to [email or web address] - password [email or web address]  [facility] -  Office  [phone]

## 2015-07-06 LAB — CULTURE, BLOOD (ROUTINE X 2): Culture: NO GROWTH

## 2015-07-06 LAB — CBC
HCT: 27.1 % — ABNORMAL LOW (ref 39.0–52.0)
Hemoglobin: 8.2 g/dL — ABNORMAL LOW (ref 13.0–17.0)
MCH: 24.5 pg — ABNORMAL LOW (ref 26.0–34.0)
MCHC: 30.3 g/dL (ref 30.0–36.0)
MCV: 80.9 fL (ref 78.0–100.0)
Platelets: 91 10*3/uL — ABNORMAL LOW (ref 150–400)
RBC: 3.35 MIL/uL — ABNORMAL LOW (ref 4.22–5.81)
RDW: 14.6 % (ref 11.5–15.5)
WBC: 3.9 10*3/uL — ABNORMAL LOW (ref 4.0–10.5)

## 2015-07-06 NOTE — Care Management Important Message (Signed)
Important Message  Patient Details  Name: Gregory Wiley MRN: [id number] Date of Birth: 1950/08/21   Medicare Important Message Given:  Yes    [name] [name] 07/06/2015, 4:22 PM

## 2015-07-06 NOTE — Progress Notes (Signed)
PROGRESS NOTE                                                                                                                                                                                                             Patient Demographics:    Gregory Wiley, is a 65 y.o. male, DOB - 28-Aug-1950, [id number]  Admit date - 07/02/2015   Admitting Physician Thurnell Lose, MD  Outpatient Primary MD for the patient is Gregory Stain, MD  LOS - 3  Outpatient Specialists:   Chief Complaint  Patient presents with  . Fever  . Nausea  . Cough       Brief Narrative      Subjective:    Gregory Wiley today has, No headache, No chest pain, No abdominal pain - No Nausea, No new weakness tingling or numbness, No Cough - SOB. Dull generalized abdominal pain, overall feels ok.   Assessment  & Plan :   1.Rectus sheath hematoma with blood loss related anemia. Caused due to persistent cough with musculoskeletal strain in the setting of steroid to use. Hold xaralto, cough suppressant, supportive care, monitor H&H, type and screen. Discussed with both pulmonary physician on call Dr. Elsworth Soho and hematologist Dr. Beryle Beams, he will benefit from low-dose xaralto 10 mg daily once bleeding is stable likely 7-10 days, risk of stroke and DVT PE during this period has been explained to patient and wife bedside, they accept the risks and benefits, agree and understand the plan.  2. Possible acute tonsillitis from strep Throat with reactive cervical submandibular lymphadenopathy left greater than right with bacteremia versus contamination - had shown much improvement with Unasyn/Augmentin which will be continued, last admission one out of 2 sets of blood cultures were ordered, one bottle from the first set is growing coag-negative staph, one bottle out of 2 from the second culture is growing gram-positive cocci as well, I talked to microbiology lab on  07/04/2015. Blood cultures drawn this admission on 07/05/2015 negative. DW ID Dr Johnnye Sima.  For now I will continue Vancomycin and Unasyn till all cultures have finalized, patient currently is afebrile, nontoxic and symptom-free.   Once discharged a needs to follow-up with ENT one time in 1-2 weeks till his lymphadenopathy and left-sided neck swelling has resolved.    3. History of adrenal insufficiency with hypotension in the ER.  Resolved with IV fluids, he is already on midodrine and Florinef which will be continued, BP stable.  4. 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 Holding anticoagulation for now due to rectus sheath bleed, will monitor closely. SCDs for now. We'll stable baseline lower extremity venous duplex.  5. OSA. CPAP at night continue.  6. History of CVA with residual left-sided hemiparesis. At baseline.  7. Chronic anemia with some worsening due to blood loss in the rectus sheath hematoma. We'll check anemia panel and monitor, goal will be to keep hemoglobin around 7.5.    Code Status : Full  Family Communication  : wife  Disposition Plan  : Home in the morning if hemoglobin stable  Barriers For Discharge : Rectus sheath bleed  Consults  :  [facility] over the phone by admitting MD -  have nothing to offer, Dr Beryle Beams Hematology over the phone by me x 2, ID Dr Johnnye Sima phone on 07-05-15  Procedures  :   CT Neck - consistent of tonsillitis and enlarged submandibular lymph nodes  CT scan abdomen and pelvis. Showing fatty liver along with rectus sheath hematoma  Bilateral lower extremity venous duplex - Bilateral: No evidence of DVT, superficial thrombosis, or Baker's Cyst.    DVT Prophylaxis  :  SCDs    Lab Results  Component Value Date   PLT 91* 07/06/2015    Antibiotics  :    Anti-infectives    Start     Dose/Rate Route Frequency Ordered Stop   07/03/15 1430  vancomycin (VANCOCIN) 1,250 mg in sodium chloride 0.9 % 250 mL IVPB     1,250 mg 166.7 mL/hr  over 90 Minutes Intravenous Every 12 hours 07/03/15 1418     07/03/15 1100  vancomycin (VANCOCIN) 1,250 mg in sodium chloride 0.9 % 250 mL IVPB  Status:  Discontinued     1,250 mg 166.7 mL/hr over 90 Minutes Intravenous Every 12 hours 07/02/15 2329 07/03/15 1418   07/03/15 0130  Ampicillin-Sulbactam (UNASYN) 3 g in sodium chloride 0.9 % 100 mL IVPB     3 g 100 mL/hr over 60 Minutes Intravenous Every 6 hours 07/03/15 0121     07/02/15 2315  vancomycin (VANCOCIN) 2,000 mg in sodium chloride 0.9 % 500 mL IVPB     2,000 mg 250 mL/hr over 120 Minutes Intravenous  Once 07/02/15 2311 07/03/15 0344        Objective:   Filed Vitals:   07/05/15 1504 07/05/15 2109 07/05/15 2300 07/06/15 0523  BP: 122/61 109/59  120/63  Pulse: 67 69 75 69  Temp: 98.3 F (36.8 C) 98.5 F (36.9 C)  97.8 F (36.6 C)  TempSrc:  Oral  Oral  Resp: 18 18 18 18   Height:      Weight:      SpO2:  95% 94% 93%    Wt Readings from Last 3 Encounters:  07/03/15 97.16 kg (214 lb 3.2 oz)  07/02/15 92.216 kg (203 lb 4.8 oz)  05/26/15 97.977 kg (216 lb)     Intake/Output Summary (Last 24 hours) at 07/06/15 0857 Last data filed at 07/06/15 0829  Gross per 24 hour  Intake    450 ml  Output    375 ml  Net     75 ml     Physical Exam  Awake Alert, Oriented X 3, No new F.N deficits, Normal affect [place].AT,PERRAL, L sided throat swelling, enlarged submandibular nodes L>R Supple Neck,No JVD, No cervical lymphadenopathy appriciated.  Symmetrical Chest wall movement, Good  air movement bilaterally, CTAB RRR,No Gallops,Rubs or new Murmurs, No Parasternal Heave +ve B.Sounds, Abd Soft, No tenderness, No organomegaly appriciated, No rebound - guarding or rigidity. No Cyanosis, Clubbing or edema, No new Rash or bruise      Data Review:    CBC  Recent Labs Lab 07/01/15 1926  07/02/15 2127 07/03/15 0556 07/03/15 1503 07/04/15 0835 07/05/15 0655 07/06/15 0517  WBC 5.1  < > 4.5 4.2  --  3.7* 3.5* 3.9*  HGB 10.7*   < > 9.8* 8.3* 8.7* 8.4* 8.0* 8.2*  HCT 35.6*  < > 31.9* 25.7* 28.5* 27.2* 25.3* 27.1*  PLT 163  < > 137* 102*  --  90* 79* 91*  MCV 81.7  < > 79.9 80.6  --  80.7 80.6 80.9  MCH 24.5*  < > 24.6* 26.0  --  24.9* 25.5* 24.5*  MCHC 30.1  < > 30.7 32.3  --  30.9 31.6 30.3  RDW 14.4  < > 14.2 14.5  --  14.5 14.7 14.6  LYMPHSABS 0.7  --  0.9  --   --   --   --   --   MONOABS 0.4  --  0.3  --   --   --   --   --   EOSABS 0.0  --  0.0  --   --   --   --   --   BASOSABS 0.0  --  0.0  --   --   --   --   --   < > = values in this interval not displayed.  Chemistries   Recent Labs Lab 07/01/15 1926 07/02/15 0319 07/02/15 2127 07/03/15 0556 07/04/15 0835 07/05/15 0655  NA 140 139 140 139 139 139  K 3.4* 3.7 3.1* 3.2* 3.2* 3.5  CL 107 109 108 109 108 108  CO2 23 20* 20* 20* 20* 24  GLUCOSE 125* 115* 102* 98 79 86  BUN 15 14 12 10 8 9   CREATININE 1.16 1.03 0.96 0.88 0.92 0.74  CALCIUM 8.8* 8.2* 8.6* 7.7* 7.8* 7.8*  MG  --   --   --   --  1.7 1.9  AST 25  --  26  --   --   --   ALT 15*  --  14*  --   --   --   ALKPHOS 53  --  50  --   --   --   BILITOT 0.5  --  0.6  --   --   --    ------------------------------------------------------------------------------------------------------------------ No results for input(s): CHOL, HDL, LDLCALC, TRIG, CHOLHDL, LDLDIRECT in the last 72 hours.  Lab Results  Component Value Date   HGBA1C 5.6 02/13/2014   ------------------------------------------------------------------------------------------------------------------ No results for input(s): TSH, T4TOTAL, T3FREE, THYROIDAB in the last 72 hours.  Invalid input(s): FREET3 ------------------------------------------------------------------------------------------------------------------  Recent Labs  07/05/15 1129  VITAMINB12 286  FOLATE 23.1  FERRITIN 45  TIBC 218*  IRON 12*  RETICCTPCT 0.4    Coagulation profile  Recent Labs Lab 07/03/15 0127  INR 1.20    No results for  input(s): DDIMER in the last 72 hours.  Cardiac Enzymes No results for input(s): CKMB, TROPONINI, MYOGLOBIN in the last 168 hours.  Invalid input(s): CK ------------------------------------------------------------------------------------------------------------------    Component Value Date/Time   BNP 103.3* 04/11/2014 2310    Inpatient Medications  Scheduled Meds: . ampicillin-sulbactam (UNASYN) IV  3 g Intravenous Q6H  . fludrocortisone  0.1 mg Oral BID  . guaiFENesin-codeine  10 mL Oral Q6H  . midodrine  2.5 mg Oral TID WC  . pantoprazole  40 mg Oral BID  . potassium chloride SA  40 mEq Oral Daily  . rosuvastatin  40 mg Oral QHS  . senna  1 tablet Oral BID  . senna-docusate  1 tablet Oral BID  . sertraline  50 mg Oral Daily  . vancomycin  1,250 mg Intravenous Q12H   Continuous Infusions:   PRN Meds:.ALPRAZolam, HYDROcodone-acetaminophen, morphine injection, nitroGLYCERIN, ondansetron (ZOFRAN) IV, tiZANidine  Micro Results Recent Results (from the past 240 hour(s))  Culture, group A strep     Status: None   Collection Time: 07/01/15  7:31 PM  Result Value Ref Range Status   Specimen Description THROAT  Final   Special Requests NONE  Final   Culture NO GROUP A STREP (S.PYOGENES) ISOLATED  Final   Report Status 07/04/2015 FINAL  Final  Blood culture (routine x 2)     Status: Abnormal (Preliminary result)   Collection Time: 07/01/15  8:07 PM  Result Value Ref Range Status   Specimen Description BLOOD RIGHT ANTECUBITAL  Final   Special Requests BOTTLES DRAWN AEROBIC AND ANAEROBIC 5CC  Final   Culture  Setup Time   Final    GRAM POSITIVE COCCI IN CLUSTERS CRITICAL RESULT CALLED TO, READ BACK BY AND VERIFIED WITH: DR. Candiss Norse AT [id number] ON [id number] BY S. YARBROUGH IN BOTH AEROBIC AND ANAEROBIC BOTTLES    Culture (A)  Final    STAPHYLOCOCCUS SPECIES (COAGULASE NEGATIVE) SUSCEPTIBILITIES TO FOLLOW    Report Status PENDING  Incomplete  Urine culture     Status: None    Collection Time: 07/01/15  8:10 PM  Result Value Ref Range Status   Specimen Description URINE, CLEAN CATCH  Final   Special Requests NONE  Final   Culture MULTIPLE SPECIES PRESENT, SUGGEST RECOLLECTION  Final   Report Status 07/03/2015 FINAL  Final  Blood culture (routine x 2)     Status: None (Preliminary result)   Collection Time: 07/01/15  9:23 PM  Result Value Ref Range Status   Specimen Description BLOOD RIGHT FOREARM  Final   Special Requests BOTTLES DRAWN AEROBIC AND ANAEROBIC 5CC  Final   Culture NO GROWTH 4 DAYS  Final   Report Status PENDING  Incomplete  Culture, blood (Routine X 2) w Reflex to ID Panel     Status: Abnormal (Preliminary result)   Collection Time: 07/03/15 12:01 AM  Result Value Ref Range Status   Specimen Description BLOOD  Final   Special Requests BOTTLES DRAWN AEROBIC AND ANAEROBIC  Final   Culture  Setup Time   Final    GRAM POSITIVE COCCI ANAEROBIC BOTTLE ONLY CRITICAL RESULT CALLED TO, READ BACK BY AND VERIFIED WITH: DR [facility] 07/05/15 @ 89 M VESTAL    Culture (A)  Final    STAPHYLOCOCCUS SPECIES (COAGULASE NEGATIVE) SUSCEPTIBILITIES TO FOLLOW    Report Status PENDING  Incomplete  Blood culture (routine x 2)     Status: None (Preliminary result)   Collection Time: 07/03/15  1:27 AM  Result Value Ref Range Status   Specimen Description BLOOD RIGHT HAND  Final   Special Requests IN PEDIATRIC BOTTLE 4CC  Final   Culture NO GROWTH 2 DAYS  Final   Report Status PENDING  Incomplete    Radiology Reports Dg Chest 2 View  07/01/2015  CLINICAL DATA:  Cough and fever EXAM: CHEST  2 VIEW COMPARISON:  04/07/2014 FINDINGS: Cardiomegaly with negative aortic and hilar  contours. Status post CABG. Low volume chest with interstitial crowding. There is no edema, consolidation, effusion, or pneumothorax. Implantable loop recorder. 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 Patient has previous cervical spine CT imaging in 2016. IMPRESSION:  Low volume chest without acute finding. Electronically Signed   By: Monte Fantasia M.D.   On: 07/01/2015 20:46   Ct Soft Tissue Neck W Contrast  07/02/2015  CLINICAL DATA:  Initial evaluation for acute left-sided neck swelling with sore throat. EXAM: CT NECK WITH CONTRAST TECHNIQUE: Multidetector CT imaging of the neck was performed using the standard protocol following the bolus administration of intravenous contrast. CONTRAST:  1 ISOVUE-300 IOPAMIDOL (ISOVUE-300) INJECTION 61% COMPARISON:  None. FINDINGS: Visualized portions of the brain demonstrate no acute process. Encephalomalacia noted within the anterior right temporal pole. Partially visualized globes and orbits demonstrate no acute abnormality. Scattered mucosal thickening within the max O sinuses and ethmoidal air cells. Fluid level within the right sphenoid sinus. Mucosal thickening within the left sphenoid sinus. Mastoid air cells are clear. Middle ear cavities are clear. Salivary glands including the parotid glands and submandibular glands are normal. Oral cavity is unremarkable, although evaluation somewhat limited by streak artifact from dental amalgam. No acute abnormality about the dentition. Palatine tonsils are mildly prominent and hyper enhancing, which may reflect acute tonsillitis. No peritonsillar abscess or other collection. Epiglottis normal. Vallecula clear. No retropharyngeal fluid collection or edema. Remainder of the hypopharynx and supraglottic larynx within normal limits without acute inflammatory changes. True cords grossly normal. Subglottic airway clear. Approximately 2 cm hypodense nodule with internal calcification present within the inferior left thyroid lobe, indeterminate. Thyroid otherwise within normal limits. Enlarged left level 2 lymph nodes measure up to 13 mm. A few of these nodes are somewhat heterogeneous and partially hypodense in appearance. Increased number of left level 2 P and level 5 nodes as compared to the  right. These measure up to 15 mm. Increased number of subcentimeter left level 2/3 nodes as well. There is associated inflammatory stranding within the adjacent fat within the is area of adenopathy in the left neck. Finding is indeterminate, and could reflect supper node adenitis or reactive adenopathy. Nodal malignancy could also have this appearance. No significant adenopathy seen within the right neck. Visualized superior mediastinum within normal limits. Visualized lungs are clear. Normal intravascular enhancement seen throughout the neck. No acute osseous abnormality. No worrisome lytic or blastic osseous lesions. Prominent bridging anterior osteophytes seen throughout the cervical spine. IMPRESSION: 1. Left-sided cervical adenopathy with associated inflammatory stranding as detailed above, indeterminate, but may reflect sequela of suppurative adenitis. Alternatively, these nodes may be reactive in nature. Nodal malignancy could also have this appearance. Clinical followup to resolution is recommended. 2. Question mild enlargement and hyper enhancement of the palatine tonsils, which may reflect acute tonsillitis. Correlation with physical exam and direct visualization recommended. 3. Approximately 2 cm left thyroid nodule, indeterminate. Correlation with dedicated thyroid ultrasound recommended. This could be performed on a nonemergent basis. 4. Prominent anterior bridging osteophytic spurring throughout the cervical spine. 5. Acute on chronic paranasal sinus disease as above. Electronically Signed   By: Jeannine Boga M.D.   On: 07/02/2015 00:13   Ct Abdomen Pelvis W Contrast  07/03/2015  CLINICAL DATA:  Suprapubic abdominal pain since this morning. Nausea. Fever. Cough. EXAM: CT ABDOMEN AND PELVIS WITH CONTRAST TECHNIQUE: Multidetector CT imaging of the abdomen and pelvis was performed using the standard protocol following bolus administration of intravenous contrast. CONTRAST:  148mL ISOVUE-300  IOPAMIDOL (ISOVUE-300) INJECTION 61% COMPARISON:  12/23/2014 FINDINGS: Motion artifact limits evaluation of lung bases. Postoperative changes in the mediastinum. Mild diffuse fatty infiltration of the liver. Scattered calcified granulomas in the liver and spleen. Fatty infiltration of the pancreas. Small cysts in the kidneys. No hydronephrosis. Nephrograms are symmetrical. Calcification of aorta without aneurysm. No adrenal gland nodules. Normal caliber IVC. No retroperitoneal lymphadenopathy. Stomach, small bowel, and colon are not abnormally distended. No free air or free fluid in the abdomen. Pelvis: Appendix is normal. Prostate gland is enlarged, measuring 5.8 cm diameter. The mid and inferior portions of the rectus abdominus muscles bilaterally are expanded with infiltration in the fat around the rectus abdominus muscles and in the anterior low pelvis. This suggest rectus muscle hematomas, possibly due to muscle injury or tear. There is pooling of contrast material within the rectus abdominus muscles, more on the left. This may indicate active contrast extravasation. Bladder wall is not thickened. Postoperative changes in the right hip with intra medullary rod and compression bolt fixation. There is lucency around the visualized compression bolt and rod. This could indicate loosening of hardware. Degenerative changes in the spine and hips. Bilateral inguinal hernias containing fat. IMPRESSION: Bilateral inferior rectus abdominus muscle hematomas with soft tissue hematoma in the surrounding fat and anterior pelvic wall. Puddling of contrast material in the inferior rectus muscles bilaterally but greater on the left may indicate active extravasation. Additional incidental findings as discussed in the body of report. These results will be called to the ordering clinician or representative by the Radiologist Assistant, and communication documented in the PACS or zVision Dashboard. Electronically Signed   By:  Lucienne Capers M.D.   On: 07/03/2015 01:18    Time Spent in minutes  25   Natesha Hassey K M.D on 07/06/2015 at 8:57 AM  Between 7am to 7pm - Pager - [phone]  After 7pm go to [email or web address] - password [facility]  [facility] -  Office  [phone]

## 2015-07-07 ENCOUNTER — Ambulatory Visit: Payer: Medicare Other | Admitting: Family Medicine

## 2015-07-07 ENCOUNTER — Inpatient Hospital Stay (HOSPITAL_COMMUNITY): Payer: Medicare Other

## 2015-07-07 DIAGNOSIS — I340 Nonrheumatic mitral (valve) insufficiency: Secondary | ICD-10-CM

## 2015-07-07 DIAGNOSIS — R1013 Epigastric pain: Secondary | ICD-10-CM

## 2015-07-07 LAB — CBC
HCT: 25.4 % — ABNORMAL LOW (ref 39.0–52.0)
Hemoglobin: 7.8 g/dL — ABNORMAL LOW (ref 13.0–17.0)
MCH: 24.5 pg — ABNORMAL LOW (ref 26.0–34.0)
MCHC: 30.7 g/dL (ref 30.0–36.0)
MCV: 79.9 fL (ref 78.0–100.0)
Platelets: 98 10*3/uL — ABNORMAL LOW (ref 150–400)
RBC: 3.18 MIL/uL — ABNORMAL LOW (ref 4.22–5.81)
RDW: 14.3 % (ref 11.5–15.5)
WBC: 4.3 10*3/uL (ref 4.0–10.5)

## 2015-07-07 LAB — TYPE AND SCREEN
ABO/RH(D): O POS
Antibody Screen: NEGATIVE
Unit division: 0
Unit division: 0
Unit division: 0
Unit division: 0

## 2015-07-07 LAB — ECHOCARDIOGRAM COMPLETE
Height: 72 in
Weight: 3427.2 oz

## 2015-07-07 MED ORDER — FERROUS SULFATE 325 (65 FE) MG PO TABS
325.0000 mg | ORAL_TABLET | Freq: Three times a day (TID) | ORAL | Status: DC
  Administered 2015-07-07 – 2015-07-09 (×7): 325 mg via ORAL
  Filled 2015-07-07 (×6): qty 1

## 2015-07-07 MED ORDER — SODIUM CHLORIDE 0.9% FLUSH
10.0000 mL | INTRAVENOUS | Status: DC | PRN
  Administered 2015-07-08 – 2015-07-09 (×2): 10 mL
  Filled 2015-07-07 (×2): qty 40

## 2015-07-07 MED ORDER — SODIUM CHLORIDE 0.9 % IV SOLN
1000.0000 mg | Freq: Once | INTRAVENOUS | Status: AC
  Administered 2015-07-07: 1000 mg via INTRAVENOUS
  Filled 2015-07-07: qty 20

## 2015-07-07 MED ORDER — SODIUM CHLORIDE 0.9 % IV SOLN
25.0000 mg | Freq: Once | INTRAVENOUS | Status: AC
  Administered 2015-07-07: 25 mg via INTRAVENOUS
  Filled 2015-07-07: qty 0.5

## 2015-07-07 MED ORDER — AMOXICILLIN 500 MG PO CAPS
500.0000 mg | ORAL_CAPSULE | Freq: Two times a day (BID) | ORAL | Status: DC
  Administered 2015-07-07 – 2015-07-09 (×4): 500 mg via ORAL
  Filled 2015-07-07 (×6): qty 1

## 2015-07-07 NOTE — Progress Notes (Signed)
Pharmacy Antibiotic Note Gregory Wiley is a 65 y.o. male admitted early 4/16 for acute tonsillitis and subsequently discharged home on Augmentin that was called back to hospital 4/16 pm with GPC in clusters blood cx's. Pharmacy asked to dose vancomycin, also now to switch from Unasyn to PO pencillin for strep throat/tonsillitis.  Plan: Continue vancomycin 1250 mg IV q12h Amoxicillin 500mg  PO BID Monitor renal function, VT soon   Temp (24hrs), Avg:98.2 F (36.8 C), Min:97.9 F (36.6 C), Max:98.6 F (37 C)   Recent Labs Lab 07/01/15 2018 07/02/15 0319 07/02/15 2127 07/03/15 0127 07/03/15 0556 07/04/15 0835 07/05/15 0655 07/06/15 0517 07/07/15 0515  WBC  --  3.9* 4.5  --  4.2 3.7* 3.5* 3.9* 4.3  CREATININE  --  1.03 0.96  --  0.88 0.92 0.74  --   --   LATICACIDVEN 1.78  --   --  1.6  --   --   --   --   --     Estimated Creatinine Clearance: 111.2 mL/min (by C-G formula based on Cr of 0.74).    Antimicrobials this admission: Vancomycin 4/16 >> (14 day course planned per 4/21 note) Unasyn 4/17>> 4/21 Amoxicillin 4/21>>(4/26- not yet entered)  Dose adjustments this admission: n/a  Microbiology results: 4/19 BCx- ngtd 4/17 Blood - 1/2 CoNS, R erthyro and oxacillin 4/15 Blood - neg 4/15 Urine - multiple species 4/15 GAS, throat - negative  Thank you for allowing pharmacy to be a part of this patient's care.  Chequita Mofield D. Luan Urbani, PharmD, BCPS Clinical Pharmacist Pager: [phone] 07/07/2015 12:23 PM

## 2015-07-07 NOTE — Progress Notes (Signed)
PT Cancellation Note  Patient Details Name: Gregory Wiley MRN: [id number] DOB: 05-Apr-1950   Cancelled Treatment:    Reason Eval/Treat Not Completed: Fatigue/lethargy limiting ability to participate;Other (comment) (States he did not sleep at all and the nurses "don't care").  Assured pt that his nurses are very invested in his care and will share with his RN that he has not slept well.   Ramond Dial 07/07/2015, 10:00 AM   Mee Hives, PT MS Acute Rehab Dept. Number: [facility] [id number] and [place] [phone]

## 2015-07-07 NOTE — Progress Notes (Signed)
Peripherally Inserted Central Catheter/Midline Placement  The IV Nurse has discussed with the patient and/or persons authorized to consent for the patient, the purpose of this procedure and the potential benefits and risks involved with this procedure.  The benefits include less needle sticks, lab draws from the catheter and patient may be discharged home with the catheter.  Risks include, but not limited to, infection, bleeding, blood clot (thrombus formation), and puncture of an artery; nerve damage and irregular heat beat.  Alternatives to this procedure were also discussed.  PICC/Midline Placement Documentation        Trevione Wert, Nicolette Bang 07/07/2015, 9:38 PM

## 2015-07-07 NOTE — Progress Notes (Signed)
PROGRESS NOTE                                                                                                                                                                                                             Patient Demographics:    Gregory Wiley, is a 65 y.o. male, DOB - Aug 15, 1950, [id number]  Admit date - 07/02/2015   Admitting Physician Thurnell Lose, MD  Outpatient Primary MD for the patient is Gregory Stain, MD  LOS - 4  Outpatient Specialists:   Chief Complaint  Patient presents with  . Fever  . Nausea  . Cough       Brief Narrative      Subjective:    Gregory Wiley today has, No headache, No chest pain, No abdominal pain - No Nausea, No new weakness tingling or numbness, No Cough - SOB. Dull generalized abdominal pain, overall feels ok.   Assessment  & Plan :   1.Rectus sheath hematoma with blood loss related anemia. Caused due to persistent cough with musculoskeletal strain in the setting of steroid to use. Hold xaralto, cough suppressant, supportive care, monitor H&H, type and screen. Discussed with both pulmonary physician on call Dr. Elsworth Soho and hematologist Dr. Beryle Beams, he will benefit from low-dose xaralto 10 mg daily once bleeding is stable likely 7-10 days, risk of stroke and DVT PE during this period has been explained to patient and wife bedside, they accept the risks and benefits, agree and understand the plan.  2. Possible acute tonsillitis from strep Throat with reactive cervical submandibular lymphadenopathy left greater than right with bacteremia versus contamination - had shown much improvement with Unasyn/Augmentin We will transition to oral penicillin and complete ten-day treatment.  Once discharged a needs to follow-up with ENT one time in 1-2 weeks till his lymphadenopathy and left-sided neck swelling has resolved.   3. History of adrenal insufficiency with hypotension in the  ER. Resolved with IV fluids, he is already on midodrine and Florinef which will be continued, BP stable.  4. 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 Holding anticoagulation for now due to rectus sheath bleed, will monitor closely. SCDs for now. We'll stable baseline lower extremity venous duplex.  5. OSA. CPAP at night continue.  6. History of CVA with residual left-sided hemiparesis. At baseline.  7. Chronic anemia with some worsening  due to blood loss in the rectus sheath hematoma. Anemia panel consistent with iron deficiency he will receive IV iron along with oral supplementation.  8. 1/2 Blood cultures from 2 different sets positive for coag-negative staph. Third set unremarkable however this was on antibiotics, Discussed with ID physician Dr Johnnye Sima, since third set is negative will place PICC line and do 14 days IV vancomycin, Will get echogram as well.   Code Status : Full  Family Communication  : wife  Disposition Plan  : Home in the morning if hemoglobin stable  Barriers For Discharge : Rectus sheath bleed  Consults  :  [facility] over the phone by admitting MD -  have nothing to offer, Dr Beryle Beams Hematology over the phone by me x 2, ID Dr Johnnye Sima phone on 07-05-15  Procedures  :   TTE  CT Neck - consistent of tonsillitis and enlarged submandibular lymph nodes  CT scan abdomen and pelvis. Showing fatty liver along with rectus sheath hematoma  Bilateral lower extremity venous duplex - Bilateral: No evidence of DVT, superficial thrombosis, or Baker's Cyst.    DVT Prophylaxis  :  SCDs    Lab Results  Component Value Date   PLT 98* 07/07/2015    Antibiotics  :    Anti-infectives    Start     Dose/Rate Route Frequency Ordered Stop   07/03/15 1430  vancomycin (VANCOCIN) 1,250 mg in sodium chloride 0.9 % 250 mL IVPB     1,250 mg 166.7 mL/hr over 90 Minutes Intravenous Every 12 hours 07/03/15 1418     07/03/15 1100  vancomycin (VANCOCIN) 1,250 mg in sodium chloride 0.9 %  250 mL IVPB  Status:  Discontinued     1,250 mg 166.7 mL/hr over 90 Minutes Intravenous Every 12 hours 07/02/15 2329 07/03/15 1418   07/03/15 0130  Ampicillin-Sulbactam (UNASYN) 3 g in sodium chloride 0.9 % 100 mL IVPB     3 g 100 mL/hr over 60 Minutes Intravenous Every 6 hours 07/03/15 0121     07/02/15 2315  vancomycin (VANCOCIN) 2,000 mg in sodium chloride 0.9 % 500 mL IVPB     2,000 mg 250 mL/hr over 120 Minutes Intravenous  Once 07/02/15 2311 07/03/15 0344        Objective:   Filed Vitals:   07/06/15 1446 07/06/15 2110 07/06/15 2304 07/07/15 0529  BP: 112/65 118/60  111/51  Pulse: 72 69 61 69  Temp: 97.9 F (36.6 C) 98.6 F (37 C)  98.2 F (36.8 C)  TempSrc:  Oral  Oral  Resp: 20 18 16 20   Height:      Weight:      SpO2: 93% 93% 93% 92%    Wt Readings from Last 3 Encounters:  07/03/15 97.16 kg (214 lb 3.2 oz)  07/02/15 92.216 kg (203 lb 4.8 oz)  05/26/15 97.977 kg (216 lb)     Intake/Output Summary (Last 24 hours) at 07/07/15 1158 Last data filed at 07/07/15 0600  Gross per 24 hour  Intake    730 ml  Output    650 ml  Net     80 ml     Physical Exam  Awake Alert, Oriented X 3, No new F.N deficits, Normal affect [place].AT,PERRAL, L sided throat swelling, enlarged submandibular nodes L>R Supple Neck,No JVD, No cervical lymphadenopathy appriciated.  Symmetrical Chest wall movement, Good air movement bilaterally, CTAB RRR,No Gallops,Rubs or new Murmurs, No Parasternal Heave +ve B.Sounds, Abd Soft, No tenderness, No organomegaly appriciated, No rebound -  guarding or rigidity. No Cyanosis, Clubbing or edema, No new Rash or bruise      Data Review:    CBC  Recent Labs Lab 07/01/15 1926  07/02/15 2127 07/03/15 0556 07/03/15 1503 07/04/15 0835 07/05/15 0655 07/06/15 0517 07/07/15 0515  WBC 5.1  < > 4.5 4.2  --  3.7* 3.5* 3.9* 4.3  HGB 10.7*  < > 9.8* 8.3* 8.7* 8.4* 8.0* 8.2* 7.8*  HCT 35.6*  < > 31.9* 25.7* 28.5* 27.2* 25.3* 27.1* 25.4*  PLT 163  < >  137* 102*  --  90* 79* 91* 98*  MCV 81.7  < > 79.9 80.6  --  80.7 80.6 80.9 79.9  MCH 24.5*  < > 24.6* 26.0  --  24.9* 25.5* 24.5* 24.5*  MCHC 30.1  < > 30.7 32.3  --  30.9 31.6 30.3 30.7  RDW 14.4  < > 14.2 14.5  --  14.5 14.7 14.6 14.3  LYMPHSABS 0.7  --  0.9  --   --   --   --   --   --   MONOABS 0.4  --  0.3  --   --   --   --   --   --   EOSABS 0.0  --  0.0  --   --   --   --   --   --   BASOSABS 0.0  --  0.0  --   --   --   --   --   --   < > = values in this interval not displayed.  Chemistries   Recent Labs Lab 07/01/15 1926 07/02/15 0319 07/02/15 2127 07/03/15 0556 07/04/15 0835 07/05/15 0655  NA 140 139 140 139 139 139  K 3.4* 3.7 3.1* 3.2* 3.2* 3.5  CL 107 109 108 109 108 108  CO2 23 20* 20* 20* 20* 24  GLUCOSE 125* 115* 102* 98 79 86  BUN 15 14 12 10 8 9   CREATININE 1.16 1.03 0.96 0.88 0.92 0.74  CALCIUM 8.8* 8.2* 8.6* 7.7* 7.8* 7.8*  MG  --   --   --   --  1.7 1.9  AST 25  --  26  --   --   --   ALT 15*  --  14*  --   --   --   ALKPHOS 53  --  50  --   --   --   BILITOT 0.5  --  0.6  --   --   --    ------------------------------------------------------------------------------------------------------------------ No results for input(s): CHOL, HDL, LDLCALC, TRIG, CHOLHDL, LDLDIRECT in the last 72 hours.  Lab Results  Component Value Date   HGBA1C 5.6 02/13/2014   ------------------------------------------------------------------------------------------------------------------ No results for input(s): TSH, T4TOTAL, T3FREE, THYROIDAB in the last 72 hours.  Invalid input(s): FREET3 ------------------------------------------------------------------------------------------------------------------  Recent Labs  07/05/15 1129  VITAMINB12 286  FOLATE 23.1  FERRITIN 45  TIBC 218*  IRON 12*  RETICCTPCT 0.4    Coagulation profile  Recent Labs Lab 07/03/15 0127  INR 1.20    No results for input(s): DDIMER in the last 72 hours.  Cardiac Enzymes No  results for input(s): CKMB, TROPONINI, MYOGLOBIN in the last 168 hours.  Invalid input(s): CK ------------------------------------------------------------------------------------------------------------------    Component Value Date/Time   BNP 103.3* 04/11/2014 2310    Inpatient Medications  Scheduled Meds: . ampicillin-sulbactam (UNASYN) IV  3 g Intravenous Q6H  . ferrous sulfate  325 mg Oral TID WC  . fludrocortisone  0.1 mg Oral BID  . guaiFENesin-codeine  10 mL Oral Q6H  . iron dextran (INFED/DEXFERRUM) infusion  1,000 mg Intravenous Once  . midodrine  2.5 mg Oral TID WC  . pantoprazole  40 mg Oral BID  . potassium chloride SA  40 mEq Oral Daily  . rosuvastatin  40 mg Oral QHS  . senna  1 tablet Oral BID  . senna-docusate  1 tablet Oral BID  . sertraline  50 mg Oral Daily  . vancomycin  1,250 mg Intravenous Q12H   Continuous Infusions:   PRN Meds:.ALPRAZolam, HYDROcodone-acetaminophen, morphine injection, nitroGLYCERIN, ondansetron (ZOFRAN) IV, tiZANidine  Micro Results Recent Results (from the past 240 hour(s))  Culture, group A strep     Status: None   Collection Time: 07/01/15  7:31 PM  Result Value Ref Range Status   Specimen Description THROAT  Final   Special Requests NONE  Final   Culture NO GROUP A STREP (S.PYOGENES) ISOLATED  Final   Report Status 07/04/2015 FINAL  Final  Blood culture (routine x 2)     Status: Abnormal   Collection Time: 07/01/15  8:07 PM  Result Value Ref Range Status   Specimen Description BLOOD RIGHT ANTECUBITAL  Final   Special Requests BOTTLES DRAWN AEROBIC AND ANAEROBIC 5CC  Final   Culture  Setup Time   Final    GRAM POSITIVE COCCI IN CLUSTERS CRITICAL RESULT CALLED TO, READ BACK BY AND VERIFIED WITH: DR. Candiss Norse AT [id number] ON [id number] BY S. YARBROUGH IN BOTH AEROBIC AND ANAEROBIC BOTTLES    Culture (A)  Final    STAPHYLOCOCCUS SPECIES (COAGULASE NEGATIVE) SUSCEPTIBILITIES PERFORMED ON PREVIOUS CULTURE WITHIN THE LAST 5 DAYS.     Report Status 07/06/2015 FINAL  Final  Urine culture     Status: None   Collection Time: 07/01/15  8:10 PM  Result Value Ref Range Status   Specimen Description URINE, CLEAN CATCH  Final   Special Requests NONE  Final   Culture MULTIPLE SPECIES PRESENT, SUGGEST RECOLLECTION  Final   Report Status 07/03/2015 FINAL  Final  Blood culture (routine x 2)     Status: None   Collection Time: 07/01/15  9:23 PM  Result Value Ref Range Status   Specimen Description BLOOD RIGHT FOREARM  Final   Special Requests BOTTLES DRAWN AEROBIC AND ANAEROBIC 5CC  Final   Culture NO GROWTH 5 DAYS  Final   Report Status 07/06/2015 FINAL  Final  Culture, blood (Routine X 2) w Reflex to ID Panel     Status: Abnormal   Collection Time: 07/03/15 12:01 AM  Result Value Ref Range Status   Specimen Description BLOOD RIGHT ANTECUBITAL  Final   Special Requests BOTTLES DRAWN AEROBIC AND ANAEROBIC 5CC  Final   Culture  Setup Time   Final    GRAM POSITIVE COCCI ANAEROBIC BOTTLE ONLY CRITICAL RESULT CALLED TO, READ BACK BY AND VERIFIED WITH: DR [facility] 07/05/15 @ 1041 M VESTAL    Culture STAPHYLOCOCCUS SPECIES (COAGULASE NEGATIVE) (A)  Final   Report Status 07/06/2015 FINAL  Final   Organism ID, Bacteria STAPHYLOCOCCUS SPECIES (COAGULASE NEGATIVE)  Final      Susceptibility   Staphylococcus species (coagulase negative) - MIC*    CIPROFLOXACIN <=0.5 SENSITIVE Sensitive     ERYTHROMYCIN >=8 RESISTANT Resistant     GENTAMICIN <=0.5 SENSITIVE Sensitive     OXACILLIN >=4 RESISTANT Resistant     TETRACYCLINE 2 SENSITIVE Sensitive     VANCOMYCIN 1 SENSITIVE Sensitive     TRIMETH/SULFA <=  10 SENSITIVE Sensitive     CLINDAMYCIN <=0.25 SENSITIVE Sensitive     RIFAMPIN <=0.5 SENSITIVE Sensitive     Inducible Clindamycin NEGATIVE Sensitive     * STAPHYLOCOCCUS SPECIES (COAGULASE NEGATIVE)  Blood culture (routine x 2)     Status: None (Preliminary result)   Collection Time: 07/03/15  1:27 AM  Result Value Ref Range Status    Specimen Description BLOOD RIGHT HAND  Final   Special Requests IN PEDIATRIC BOTTLE 4CC  Final   Culture NO GROWTH 3 DAYS  Final   Report Status PENDING  Incomplete  Culture, blood (routine x 2)     Status: None (Preliminary result)   Collection Time: 07/05/15 11:38 AM  Result Value Ref Range Status   Specimen Description BLOOD LEFT HAND  Final   Special Requests BOTTLES DRAWN AEROBIC ONLY 10CC  Final   Culture NO GROWTH 1 DAY  Final   Report Status PENDING  Incomplete  Culture, blood (routine x 2)     Status: None (Preliminary result)   Collection Time: 07/05/15 11:44 AM  Result Value Ref Range Status   Specimen Description BLOOD RIGHT HAND  Final   Special Requests IN PEDIATRIC BOTTLE 2CC  Final   Culture NO GROWTH 1 DAY  Final   Report Status PENDING  Incomplete    Radiology Reports Dg Chest 2 View  07/01/2015  CLINICAL DATA:  Cough and fever EXAM: CHEST  2 VIEW COMPARISON:  04/07/2014 FINDINGS: Cardiomegaly with negative aortic and hilar contours. Status post CABG. Low volume chest with interstitial crowding. There is no edema, consolidation, effusion, or pneumothorax. Implantable loop recorder. 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 Patient has previous cervical spine CT imaging in 2016. IMPRESSION: Low volume chest without acute finding. Electronically Signed   By: Monte Fantasia M.D.   On: 07/01/2015 20:46   Ct Soft Tissue Neck W Contrast  07/02/2015  CLINICAL DATA:  Initial evaluation for acute left-sided neck swelling with sore throat. EXAM: CT NECK WITH CONTRAST TECHNIQUE: Multidetector CT imaging of the neck was performed using the standard protocol following the bolus administration of intravenous contrast. CONTRAST:  1 ISOVUE-300 IOPAMIDOL (ISOVUE-300) INJECTION 61% COMPARISON:  None. FINDINGS: Visualized portions of the brain demonstrate no acute process. Encephalomalacia noted within the anterior right temporal pole. Partially visualized globes  and orbits demonstrate no acute abnormality. Scattered mucosal thickening within the max O sinuses and ethmoidal air cells. Fluid level within the right sphenoid sinus. Mucosal thickening within the left sphenoid sinus. Mastoid air cells are clear. Middle ear cavities are clear. Salivary glands including the parotid glands and submandibular glands are normal. Oral cavity is unremarkable, although evaluation somewhat limited by streak artifact from dental amalgam. No acute abnormality about the dentition. Palatine tonsils are mildly prominent and hyper enhancing, which may reflect acute tonsillitis. No peritonsillar abscess or other collection. Epiglottis normal. Vallecula clear. No retropharyngeal fluid collection or edema. Remainder of the hypopharynx and supraglottic larynx within normal limits without acute inflammatory changes. True cords grossly normal. Subglottic airway clear. Approximately 2 cm hypodense nodule with internal calcification present within the inferior left thyroid lobe, indeterminate. Thyroid otherwise within normal limits. Enlarged left level 2 lymph nodes measure up to 13 mm. A few of these nodes are somewhat heterogeneous and partially hypodense in appearance. Increased number of left level 2 P and level 5 nodes as compared to the right. These measure up to 15 mm. Increased number of subcentimeter left level 2/3 nodes as well.  There is associated inflammatory stranding within the adjacent fat within the is area of adenopathy in the left neck. Finding is indeterminate, and could reflect supper node adenitis or reactive adenopathy. Nodal malignancy could also have this appearance. No significant adenopathy seen within the right neck. Visualized superior mediastinum within normal limits. Visualized lungs are clear. Normal intravascular enhancement seen throughout the neck. No acute osseous abnormality. No worrisome lytic or blastic osseous lesions. Prominent bridging anterior osteophytes seen  throughout the cervical spine. IMPRESSION: 1. Left-sided cervical adenopathy with associated inflammatory stranding as detailed above, indeterminate, but may reflect sequela of suppurative adenitis. Alternatively, these nodes may be reactive in nature. Nodal malignancy could also have this appearance. Clinical followup to resolution is recommended. 2. Question mild enlargement and hyper enhancement of the palatine tonsils, which may reflect acute tonsillitis. Correlation with physical exam and direct visualization recommended. 3. Approximately 2 cm left thyroid nodule, indeterminate. Correlation with dedicated thyroid ultrasound recommended. This could be performed on a nonemergent basis. 4. Prominent anterior bridging osteophytic spurring throughout the cervical spine. 5. Acute on chronic paranasal sinus disease as above. Electronically Signed   By: Jeannine Boga M.D.   On: 07/02/2015 00:13   Ct Abdomen Pelvis W Contrast  07/03/2015  CLINICAL DATA:  Suprapubic abdominal pain since this morning. Nausea. Fever. Cough. EXAM: CT ABDOMEN AND PELVIS WITH CONTRAST TECHNIQUE: Multidetector CT imaging of the abdomen and pelvis was performed using the standard protocol following bolus administration of intravenous contrast. CONTRAST:  186mL ISOVUE-300 IOPAMIDOL (ISOVUE-300) INJECTION 61% COMPARISON:  12/23/2014 FINDINGS: Motion artifact limits evaluation of lung bases. Postoperative changes in the mediastinum. Mild diffuse fatty infiltration of the liver. Scattered calcified granulomas in the liver and spleen. Fatty infiltration of the pancreas. Small cysts in the kidneys. No hydronephrosis. Nephrograms are symmetrical. Calcification of aorta without aneurysm. No adrenal gland nodules. Normal caliber IVC. No retroperitoneal lymphadenopathy. Stomach, small bowel, and colon are not abnormally distended. No free air or free fluid in the abdomen. Pelvis: Appendix is normal. Prostate gland is enlarged, measuring 5.8 cm  diameter. The mid and inferior portions of the rectus abdominus muscles bilaterally are expanded with infiltration in the fat around the rectus abdominus muscles and in the anterior low pelvis. This suggest rectus muscle hematomas, possibly due to muscle injury or tear. There is pooling of contrast material within the rectus abdominus muscles, more on the left. This may indicate active contrast extravasation. Bladder wall is not thickened. Postoperative changes in the right hip with intra medullary rod and compression bolt fixation. There is lucency around the visualized compression bolt and rod. This could indicate loosening of hardware. Degenerative changes in the spine and hips. Bilateral inguinal hernias containing fat. IMPRESSION: Bilateral inferior rectus abdominus muscle hematomas with soft tissue hematoma in the surrounding fat and anterior pelvic wall. Puddling of contrast material in the inferior rectus muscles bilaterally but greater on the left may indicate active extravasation. Additional incidental findings as discussed in the body of report. These results will be called to the ordering clinician or representative by the Radiologist Assistant, and communication documented in the PACS or zVision Dashboard. Electronically Signed   By: Lucienne Capers M.D.   On: 07/03/2015 01:18    Time Spent in minutes  25   Jahden Schara K M.D on 07/07/2015 at 11:58 AM  Between 7am to 7pm - Pager - [phone]  After 7pm go to [email or web address] - password [facility]  [facility] -  Office  [phone]

## 2015-07-07 NOTE — Progress Notes (Signed)
Pharmacy asked to dose IV iron for repletion.  4/19 iron panel: TSat 5%, ferritin 45, iron 12.  Started on PO iron by Dr. Candiss Norse, also asked to dose IV iron for him.  Needs a full 1g dose based on very low TSat.  Plan: -iron dextran test dose of 25mg  -if NO reaction, then RN to call pharmacy [phone]) and we will send full maintenance dose (iron dextran 1g IV x1)  Rossana Molchan D. Ilhan Madan, PharmD, BCPS Clinical Pharmacist Pager: [phone] 07/07/2015 8:45 AM

## 2015-07-07 NOTE — Progress Notes (Signed)
  Echocardiogram 2D Echocardiogram has been performed.  Gregory Wiley 07/07/2015, 3:02 PM

## 2015-07-08 DIAGNOSIS — M7981 Nontraumatic hematoma of soft tissue: Principal | ICD-10-CM

## 2015-07-08 DIAGNOSIS — J039 Acute tonsillitis, unspecified: Secondary | ICD-10-CM

## 2015-07-08 DIAGNOSIS — Z7901 Long term (current) use of anticoagulants: Secondary | ICD-10-CM

## 2015-07-08 DIAGNOSIS — A499 Bacterial infection, unspecified: Secondary | ICD-10-CM

## 2015-07-08 LAB — VANCOMYCIN, TROUGH: Vancomycin Tr: 19 ug/mL (ref 10.0–20.0)

## 2015-07-08 LAB — BASIC METABOLIC PANEL WITH GFR
Anion gap: 10 (ref 5–15)
BUN: 5 mg/dL — ABNORMAL LOW (ref 6–20)
CO2: 21 mmol/L — ABNORMAL LOW (ref 22–32)
Calcium: 7.6 mg/dL — ABNORMAL LOW (ref 8.9–10.3)
Chloride: 107 mmol/L (ref 101–111)
Creatinine, Ser: 0.71 mg/dL (ref 0.61–1.24)
GFR calc Af Amer: >60 mL/min
GFR calc non Af Amer: >60 mL/min
Glucose, Bld: 114 mg/dL — ABNORMAL HIGH (ref 65–99)
Potassium: 3.1 mmol/L — ABNORMAL LOW (ref 3.5–5.1)
Sodium: 138 mmol/L (ref 135–145)

## 2015-07-08 LAB — CBC
HCT: 25.1 % — ABNORMAL LOW (ref 39.0–52.0)
Hemoglobin: 7.7 g/dL — ABNORMAL LOW (ref 13.0–17.0)
MCH: 24.6 pg — ABNORMAL LOW (ref 26.0–34.0)
MCHC: 30.7 g/dL (ref 30.0–36.0)
MCV: 80.2 fL (ref 78.0–100.0)
Platelets: 108 10*3/uL — ABNORMAL LOW (ref 150–400)
RBC: 3.13 MIL/uL — ABNORMAL LOW (ref 4.22–5.81)
RDW: 14.3 % (ref 11.5–15.5)
WBC: 4.8 10*3/uL (ref 4.0–10.5)

## 2015-07-08 LAB — CULTURE, BLOOD (ROUTINE X 2): Culture: NO GROWTH

## 2015-07-08 MED ORDER — RIVAROXABAN 10 MG PO TABS: 20.0000 mg | ORAL_TABLET | Freq: Every day | ORAL | Status: DC

## 2015-07-08 MED ORDER — VANCOMYCIN HCL 10 G IV SOLR: 1250.0000 mg | Freq: Two times a day (BID) | INTRAVENOUS | Status: DC

## 2015-07-08 MED ORDER — VANCOMYCIN HCL IN DEXTROSE 1-5 GM/200ML-% IV SOLN
1000.0000 mg | Freq: Two times a day (BID) | INTRAVENOUS | Status: DC
  Administered 2015-07-09 (×2): 1000 mg via INTRAVENOUS
  Filled 2015-07-08 (×4): qty 200

## 2015-07-08 MED ORDER — ASPIRIN EC 81 MG PO TBEC: 81.0000 mg | DELAYED_RELEASE_TABLET | Freq: Every day | ORAL | Status: DC

## 2015-07-08 MED ORDER — GUAIFENESIN-CODEINE 100-10 MG/5ML PO SOLN: 10.0000 mL | Freq: Four times a day (QID) | ORAL | Status: DC

## 2015-07-08 MED ORDER — AMOXICILLIN 500 MG PO CAPS: 500.0000 mg | ORAL_CAPSULE | Freq: Two times a day (BID) | ORAL | Status: DC

## 2015-07-08 MED ORDER — FERROUS SULFATE 325 (65 FE) MG PO TABS: 325.0000 mg | ORAL_TABLET | Freq: Three times a day (TID) | ORAL | Status: DC

## 2015-07-08 NOTE — Discharge Summary (Addendum)
Gregory [facility], is a 65 y.o. male  DOB 02/28/1951  MRN [id number].  Admission date:  07/02/2015  Admitting Physician  Gregory Lose, MD  Discharge Date:  07/09/2015   Primary MD  Gregory Stain, MD  Recommendations for primary care physician for things to follow:   Check CBC, BMP, magnesium in 2 days.  Resume xaralto at 10 mg daily in 5-7 days if no further evidence of bleed (rectus sheath bleed), resume aspirin in 3-4 days if stable H&H.  Outpatient follow-up with his primary cardiologist and ENT within a week   Admission Diagnosis  PE (pulmonary embolism) [I26.99] Tonsillitis [J03.90] Lower abdominal pain [R10.30] Bacteremia due to Gram-positive bacteria [A49.9] Rectus sheath hematoma, initial encounter [S30.1XXA] Fever, unspecified fever cause [R50.9]   Discharge Diagnosis  PE (pulmonary embolism) [I26.99] Tonsillitis [J03.90] Lower abdominal pain [R10.30] Bacteremia due to Gram-positive bacteria [A49.9] Rectus sheath hematoma, initial encounter [S30.1XXA] Fever, unspecified fever cause [R50.9]    Principal Problem:   Rectus sheath hematoma Active Problems:   Chronic anticoagulation   Acute tonsillitis   Bacteremia due to Gram-positive bacteria   Abdominal pain   Fever   Nontraumatic rectus hematoma      Past Medical History  Diagnosis Date  . Coronary artery disease     stent, then CABG 07/20/10  . Hypertension   . Hyperlipidemia   . Obesity (BMI 30-39.9)   . H/O cardiomyopathy     ischemic, now last echo 07/30/11, EF 38%W  . Diverticulosis   . Internal hemorrhoids   . Tubular adenoma of colon   . Plantar fasciitis of left foot   . Basal cell carcinoma of skin of left ankle   . NSTEMI (non-ST elevated myocardial infarction) ([place])     NSTEMI- last cath 06/2011-Stent to LCX-DES, last nuc  07/30/11 low risk  . MI (myocardial infarction) ([place])     "I had 4 before I retired in 2006" (11/11/2014)  . Pulmonary embolism ([place]) 03/2014  . GERD (gastroesophageal reflux disease)   . Bleeding stomach ulcer 2015  . Daily headache     "for the past month" (11/11/2014)  . Stroke ([place]) 01/2014    "no control of LUE, can slightly LLE; memory problems since" (11/11/2014)  . Arthritis     "back" (11/11/2014)  . Gout   . Depression   . Pneumonia     hosp. 12/2014  . Anxiety   . History of hiatal hernia   . 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 OSA on CPAP     since July 2013- uses CPAP sometimes , states he has lost 147 lbs. since stroke & doesn't use the CPAP as much as he use to.   . Adrenal insufficiency ([place])   . Left hemiplegia Gregory Wiley)     Past Surgical History  Procedure Laterality Date  . Coronary artery bypass graft  07/20/2010     LIMA-LAD; VG-ACUTE MARG of RCA; Seq VG-distal RCA & then pda  .  Coronary angioplasty with stent placement  07/01/2011    DES-Resolute to native LCX  . Coronary angioplasty with stent placement  12/01/2007    COMPLEX 5 LESION PCI INCLUDING CUTTING BALLOON AND 4 CYPHER DESs  . Knee arthroscopy Right   . Hand surgery Right     to take glass out  . Colonoscopy N/A 02/10/2013    Procedure: COLONOSCOPY;  Surgeon: Gregory Artist, MD;  Location: [facility] ENDOSCOPY;  Service: Endoscopy;  Laterality: N/A;  . Retinal detachment surgery Right   . Loop recorder implant  02/15/2014    MDT LINQ implanted by Dr Gregory [facility] for cryptogenic stroke  . Tee without cardioversion N/A 02/15/2014    Procedure: TRANSESOPHAGEAL ECHOCARDIOGRAM (TEE);  Surgeon: Gregory Hector, MD;  Location: [facility] ENDOSCOPY;  Service: Cardiovascular;  Laterality: N/A;  . Left heart catheterization with coronary/graft angiogram  07/01/2011    Procedure: LEFT HEART CATHETERIZATION WITH Beatrix Fetters;  Surgeon: Sanda Klein, MD;  Location: [facility] CATH LAB;  Service:  Cardiovascular;;  . Percutaneous coronary stent intervention (pci-s) Right 07/01/2011    Procedure: PERCUTANEOUS CORONARY STENT INTERVENTION (PCI-S);  Surgeon: Sanda Klein, MD;  Location: [facility] CATH LAB;  Service: Cardiovascular;  Laterality: Right;  . Umbilical hernia repair  2006  . Hernia repair    . Basal cell carcinoma excision Left 2015    ankle  . Esophagogastroduodenoscopy  01/2014    gastric ulcer, erosive gastroduodenitis, H Pylori negative.   . Esophagogastroduodenoscopy (egd) with propofol N/A 11/14/2014    Procedure: ESOPHAGOGASTRODUODENOSCOPY (EGD) WITH PROPOFOL;  Surgeon: Milus Banister, MD;  Location: [facility];  Service: Endoscopy;  Laterality: N/A;  . Intramedullary (im) nail intertrochanteric Right 02/07/2015    Procedure: INTRAMEDULLARY (IM) NAIL INTERTROCHANTRIC RIGHT HIP;  Surgeon: Renette Butters, MD;  Location: [facility];  Service: Orthopedics;  Laterality: Right;  . Cardiac surgery         HPI  from the history and physical done on the day of admission:    Gregory [facility] is a 65 y.o. gentleman with a history of CAD, HTN, HLD, adrenal insufficiency, PE (anticoagulated with Xarelto), prior CVA with left sided weakness, and OSA requiring CPAP who was admitted on 4/16 for management of acute tonsillitis. The patient was placed on IV unasyn. Blood cultures were drawn. He improved rapidly and was comfortable with discharge to home on Augmentin. Post discharge, he was notified of positive blood cultures, 1 of 2 bottles. It was felt that there was a strong likelihood that this finding would contamination, and there were no immediate plans to change treatment course while awaiting ID and sensitivities. The patient was advised to return to the ED for fever. He has developed fever and Now complains of increased lower abdomen/pelvic pain. He has had some nausea but no vomiting. No diarrhea. No dysuria. No LOC.  Repeat blood cultures and IV vancomycin ordered by the ED  physician. The patient was actually referred for admission before his CT A/P results were back. We subsequently learned that the patient has acute bilateral inferior rectus abdominus muscle hematomas with a soft tissue hematoma surrouding the fat and anterior pelvic wall. Puddling of contrast may represent active bleed. ED attending was asked to request formal consult from General Surgery. Dr. Ralene Ok is not recommending acute intervention tonight except abdominal binder (expecting that this type of bleed will be self-limited); we have been advised to call back in the morning to have the patient seen.     [facility] Course:  1.Rectus sheath hematoma with blood loss related anemia. Caused due to persistent cough with musculoskeletal strain in the setting of steroid to use. Hold xaralto, cough suppressant, supportive care, monitor H&H, type and screen. Discussed with both pulmonary physician on call Dr. Elsworth Soho and hematologist Dr. Beryle Beams, he will benefit from low-dose xaralto 10 mg daily once bleeding is stable In the next 3-4 days days after another CBC checked by PCP, risk of stroke and DVT PE during this period has been explained to patient and wife bedside, they accept the risks and benefits, agree and understand the plan. ED MD has discuused the case with surgery, abd binder and Med Rx, binder on patient.  2. Possible acute tonsillitis from strep Throat with reactive cervical submandibular lymphadenopathy left greater than right with bacteremia versus contamination - had shown much improvement with Unasyn/Augmentin We will transition to oral penicillin and complete ten-day treatment.  Once discharged a needs to follow-up with ENT one time in 1-2 weeks till his lymphadenopathy and left-sided neck swelling has resolved.   3. History of adrenal insufficiency with hypotension in the ER. Resolved with IV fluids, he is already on midodrine and Florinef which will be continued, BP  stable.  4. 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 Holding anticoagulation for now due to rectus sheath bleed, will monitor closely. SCDs for now. Stable baseline lower extremity venous duplex. Resume xaralto at 10 mg daily once H&H is stable in the next 2-3 days.  5. OSA. CPAP at night continue.  6. History of CVA with residual left-sided hemiparesis. At baseline. Resume aspirin and xaralto in 3-4 days by PCP if H&H remains stable.  7. Chronic anemia with some worsening due to blood loss in the rectus sheath hematoma. Anemia panel consistent with iron deficiency he received IV iron along with oral supplementation upon discharge.  8. 1/2 Blood cultures from 2 different sets positive for coag-negative staph. Third set unremarkable however this was on antibiotics, Discussed with ID physician Dr Johnnye Sima, since third set is negative will place PICC line and do 14 days IV vancomycin, home health RN and PICC line done, of note he had an nonacute echogram.  9. CAD with Chronic systolic heart failure with wall motion abnormalities. Echogram noted this admission with EF around 40%, echo from 2016 noted with EF 35% and similar wall motion abnormalities. He is compensated. Requested him to follow with his primary cardiologist within a week, continue statin for secondary prevention, due to adrenal insufficiency he takes midodrine and blood pressure is too low for beta blocker or ACE/ARB.  10. Hypokalemia. Replaced. Request PCP or SNF staff to recheck in 2-3 days with magnesium levels.   Note patient was discharged on 07/08/2015 however wife refused to take patient home citing that he was weak, note patient has refused PT evaluation on 07/05/2015, wife on that day said she does not want PT to see the patient either has she can provide him help at home. He is chronically weak and deconditioned due to left-sided hemiparesis from previous stroke and recent illness.  After discharge patient was again seen by PT  and he again refused SNF placement, this morning he again refuses any placement and wants to go to home, he says he doesn't even need home health PT but will consider if it is ordered, charge nurse also had detailed discussion with the patient who confirms the same. We will have case manager talk to the family and decide on disposition. If patient wishes to go  to SNF and he hasn't a bed will go there or else he will go home with home PT.  After everything was explained to the family that patient cannot be forced against his wishes to go to a SNF, the stepdaughter asked that he be given another PRBC transfusion and CT be repeated and he be kept longer, she said she was a Therapist, sports and that was her medical opinion, she was told that HB stable > 7.0 and no indication for another CT, she then said she wants a 2nd medical opinion, it will be provided.    Follow UP  Follow-up Information    Follow up with Gregory Stain, MD. Schedule an appointment as soon as possible for a visit in 2 days.   Specialty:  Family Medicine   Contact information:   [place] [address] [phone]       Follow up with CROITORU,MIHAI, MD. Schedule an appointment as soon as possible for a visit in 1 week.   Specialty:  Cardiology   Contact information:   [address] [place] [address] [phone]       Follow up with [facility].   Why:  HHRN, Aide, IV Therapy   Contact information:   [place] [address] [phone]        Consults obtained - ID over the phone  Discharge Condition: Fair  Diet and Activity recommendation: See Discharge Instructions below  Discharge Instructions           Discharge Instructions    Diet - low sodium heart healthy    Complete by:  As directed      Discharge instructions    Complete by:  As directed   Follow with Primary MD Gregory Stain, MD in 2-3 days   Get CBC, CMP, 2 view Chest X ray  checked  by Primary MD next visit.    Activity: As tolerated with Full fall precautions use walker/cane & assistance as needed   Disposition Home     Diet:   Heart Healthy    For Heart failure patients - Check your Weight same time everyday, if you gain over 2 pounds, or you develop in leg swelling, experience more shortness of breath or chest pain, call your Primary MD immediately. Follow Cardiac Low Salt Diet and 1.5 lit/day fluid restriction.   On your next visit with your primary care physician please Get Medicines reviewed and adjusted.   Please request your Prim.MD to go over all [facility] Tests and Procedure/Radiological results at the follow up, please get all [facility] records sent to your Prim MD by signing [facility] release before you go home.   If you experience worsening of your admission symptoms, develop shortness of breath, life threatening emergency, suicidal or homicidal thoughts you must seek medical attention immediately by calling 911 or calling your MD immediately  if symptoms less severe.  You Must read complete instructions/literature along with all the possible adverse reactions/side effects for all the Medicines you take and that have been prescribed to you. Take any new Medicines after you have completely understood and accpet all the possible adverse reactions/side effects.   Do not drive, operating heavy machinery, perform activities at heights, swimming or participation in water activities or provide baby sitting services if your were admitted for syncope or siezures until you have seen by Primary MD or a Neurologist and advised to do so again.  Do not drive when  taking Pain medications.    Do not take more than prescribed Pain, Sleep and Anxiety Medications  Special Instructions: If you have smoked or chewed Tobacco  in the last 2 yrs please stop smoking, stop any regular Alcohol  and or any Recreational drug use.  Wear Seat belts while  driving.   Please note  You were cared for by a hospitalist during your [facility] stay. If you have any questions about your discharge medications or the care you received while you were in the [facility] after you are discharged, you can call the unit and asked to speak with the hospitalist on call if the hospitalist that took care of you is not available. Once you are discharged, your primary care physician will handle any further medical issues. Please note that NO REFILLS for any discharge medications will be authorized once you are discharged, as it is imperative that you return to your primary care physician (or establish a relationship with a primary care physician if you do not have one) for your aftercare needs so that they can reassess your need for medications and monitor your lab values.     Discharge patient    Complete by:  As directed      Increase activity slowly    Complete by:  As directed              Discharge Medications       Medication List    STOP taking these medications        amoxicillin-clavulanate 875-125 MG tablet  Commonly known as:  AUGMENTIN      TAKE these medications        acetaminophen 325 MG tablet  Commonly known as:  TYLENOL  Take 1 tablet (325 mg total) by mouth every 6 (six) hours as needed for mild pain.     ALLEGRA PO  Take 1 tablet by mouth daily as needed (seasonal allergies).     ALPRAZolam 0.25 MG tablet  Commonly known as:  XANAX  Take 0.5 tablets (0.125 mg total) by mouth every 8 (eight) hours as needed for anxiety.     amoxicillin 500 MG capsule  Commonly known as:  AMOXIL  Take 1 capsule (500 mg total) by mouth every 12 (twelve) hours.     aspirin EC 81 MG tablet  Take 1 tablet (81 mg total) by mouth daily.  Start taking on:  07/11/2015     colchicine 0.6 MG tablet  Take 0.6 mg by mouth as needed (for gout). Reported on 05/26/2015     ferrous sulfate 325 (65 FE) MG tablet  Take 1 tablet (325 mg total) by mouth 3  (three) times daily with meals.     fludrocortisone 0.1 MG tablet  Commonly known as:  FLORINEF  Take 1 tablet (0.1 mg total) by mouth 2 (two) times daily.     fluticasone 50 MCG/ACT nasal spray  Commonly known as:  FLONASE  Place 2 sprays into both nostrils at bedtime as needed (seasonal allergies).     guaiFENesin-codeine 100-10 MG/5ML syrup  Take 10 mLs by mouth every 6 (six) hours.     HYDROcodone-acetaminophen 5-325 MG tablet  Commonly known as:  NORCO  Take 1-2 tablets by mouth every 6 (six) hours as needed for moderate pain.     hydrocortisone 25 MG suppository  Commonly known as:  ANUSOL-HC  Place 25 mg rectally as needed for hemorrhoids or itching.     Melatonin 10 MG Caps  Take 10  mg by mouth at bedtime as needed (sleep).     methylPREDNISolone 4 MG Tbpk tablet  Commonly known as:  MEDROL DOSEPAK  follow package directions     midodrine 2.5 MG tablet  Commonly known as:  PROAMATINE  Take 1 tablet (2.5 mg total) by mouth 3 (three) times daily with meals.     nitroGLYCERIN 0.4 MG SL tablet  Commonly known as:  NITROSTAT  Place 1 tablet (0.4 mg total) under the tongue every 5 (five) minutes as needed. For chest pain     pantoprazole 40 MG tablet  Commonly known as:  PROTONIX  Take 1 tablet (40 mg total) by mouth 2 (two) times daily.     potassium chloride 10 MEQ tablet  Commonly known as:  K-DUR  TAKE 4 TABLETS (40 MEQ TOTAL) BY MOUTH DAILY.     PRESCRIPTION MEDICATION  Inhale into the lungs at bedtime. CPAP     rivaroxaban 10 MG Tabs tablet  Commonly known as:  XARELTO  Take 2 tablets (20 mg total) by mouth daily with supper.  Start taking on:  07/12/2015     rosuvastatin 40 MG tablet  Commonly known as:  CRESTOR  Take 1 tablet (40 mg total) by mouth daily.     senna 8.6 MG Tabs tablet  Commonly known as:  SENOKOT  Take 1 tablet by mouth 2 (two) times daily.     SENNA S 8.6-50 MG tablet  Generic drug:  senna-docusate  Take 1 tablet by mouth 2 (two)  times daily.     sertraline 50 MG tablet  Commonly known as:  ZOLOFT  Take 1 tablet (50 mg total) by mouth daily.     tiZANidine 4 MG tablet  Commonly known as:  ZANAFLEX  Take 4 mg by mouth 2 (two) times daily as needed for muscle spasms.     vancomycin 1,250 mg in sodium chloride 0.9 % 250 mL  Inject 1,250 mg into the vein every 12 (twelve) hours. 10 day supply, HH RN to administer, draw levels 2/week to be followed by [facility].        Major procedures and Radiology Reports - PLEASE review detailed and final reports for all details, in brief -   TTE  Left ventricle: The cavity size was mildly dilated. Wall  thickness was normal. The estimated ejection fraction was 40%.  Akinesis of the anteroseptal wall and the true apex. Features are  consistent with a pseudonormal left ventricular filling pattern,  with concomitant abnormal relaxation and increased filling  pressure (grade 2 diastolic dysfunction). - Aortic valve: There was no stenosis. There was mild  regurgitation. - Aorta: Dilated aortic root. Aortic root dimension: 41 mm (ED). - Mitral valve: Mildly calcified annulus. There was mild  regurgitation. - Left atrium: The atrium was moderately to severely dilated. - Right ventricle: The cavity size was normal. Systolic function  was normal. - Right atrium: The atrium was mildly dilated. - Tricuspid valve: Peak RV-RA gradient (S): 24 mm Hg. - Systemic veins: IVC not visualized..   Mildly dilated LV with EF 40%. Wall motion abnormalities as noted above. Moderate diastolic dysfunction. Normal RV size and  systolic function. Mild MR. Mild aortic insufficiency.  TTE 04-12-14 - - Left ventricle: The cavity size was normal. There was mild focal basal hypertrophy of the septum. Systolic function was severely reduced. The estimated ejection fraction was in the range of 25% to 30%. There is akinesis of the anteroseptal and apical myocardium. Doppler parameters  are consistent with abnormal left ventricular relaxation (grade 1 diastolic dysfunction). - Aortic valve: There was trivial regurgitation. - Ascending aorta: The ascending aorta was mildly dilated.  Dg Chest 2 View  07/01/2015  CLINICAL DATA:  Cough and fever EXAM: CHEST  2 VIEW COMPARISON:  04/07/2014 FINDINGS: Cardiomegaly with negative aortic and hilar contours. Status post CABG. Low volume chest with interstitial crowding. There is no edema, consolidation, effusion, or pneumothorax. Implantable loop recorder. 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 Patient has previous cervical spine CT imaging in 2016. IMPRESSION: Low volume chest without acute finding. Electronically Signed   By: Monte Fantasia M.D.   On: 07/01/2015 20:46   Ct Soft Tissue Neck W Contrast  07/02/2015  CLINICAL DATA:  Initial evaluation for acute left-sided neck swelling with sore throat. EXAM: CT NECK WITH CONTRAST TECHNIQUE: Multidetector CT imaging of the neck was performed using the standard protocol following the bolus administration of intravenous contrast. CONTRAST:  1 ISOVUE-300 IOPAMIDOL (ISOVUE-300) INJECTION 61% COMPARISON:  None. FINDINGS: Visualized portions of the brain demonstrate no acute process. Encephalomalacia noted within the anterior right temporal pole. Partially visualized globes and orbits demonstrate no acute abnormality. Scattered mucosal thickening within the max O sinuses and ethmoidal air cells. Fluid level within the right sphenoid sinus. Mucosal thickening within the left sphenoid sinus. Mastoid air cells are clear. Middle ear cavities are clear. Salivary glands including the parotid glands and submandibular glands are normal. Oral cavity is unremarkable, although evaluation somewhat limited by streak artifact from dental amalgam. No acute abnormality about the dentition. Palatine tonsils are mildly prominent and hyper enhancing, which may reflect acute tonsillitis. No  peritonsillar abscess or other collection. Epiglottis normal. Vallecula clear. No retropharyngeal fluid collection or edema. Remainder of the hypopharynx and supraglottic larynx within normal limits without acute inflammatory changes. True cords grossly normal. Subglottic airway clear. Approximately 2 cm hypodense nodule with internal calcification present within the inferior left thyroid lobe, indeterminate. Thyroid otherwise within normal limits. Enlarged left level 2 lymph nodes measure up to 13 mm. A few of these nodes are somewhat heterogeneous and partially hypodense in appearance. Increased number of left level 2 P and level 5 nodes as compared to the right. These measure up to 15 mm. Increased number of subcentimeter left level 2/3 nodes as well. There is associated inflammatory stranding within the adjacent fat within the is area of adenopathy in the left neck. Finding is indeterminate, and could reflect supper node adenitis or reactive adenopathy. Nodal malignancy could also have this appearance. No significant adenopathy seen within the right neck. Visualized superior mediastinum within normal limits. Visualized lungs are clear. Normal intravascular enhancement seen throughout the neck. No acute osseous abnormality. No worrisome lytic or blastic osseous lesions. Prominent bridging anterior osteophytes seen throughout the cervical spine. IMPRESSION: 1. Left-sided cervical adenopathy with associated inflammatory stranding as detailed above, indeterminate, but may reflect sequela of suppurative adenitis. Alternatively, these nodes may be reactive in nature. Nodal malignancy could also have this appearance. Clinical followup to resolution is recommended. 2. Question mild enlargement and hyper enhancement of the palatine tonsils, which may reflect acute tonsillitis. Correlation with physical exam and direct visualization recommended. 3. Approximately 2 cm left thyroid nodule, indeterminate. Correlation with  dedicated thyroid ultrasound recommended. This could be performed on a nonemergent basis. 4. Prominent anterior bridging osteophytic spurring throughout the cervical spine. 5. Acute on chronic paranasal sinus disease as above. Electronically Signed   By: Jeannine Boga M.D.   On:  07/02/2015 00:13   Ct Abdomen Pelvis W Contrast  07/03/2015  CLINICAL DATA:  Suprapubic abdominal pain since this morning. Nausea. Fever. Cough. EXAM: CT ABDOMEN AND PELVIS WITH CONTRAST TECHNIQUE: Multidetector CT imaging of the abdomen and pelvis was performed using the standard protocol following bolus administration of intravenous contrast. CONTRAST:  146mL ISOVUE-300 IOPAMIDOL (ISOVUE-300) INJECTION 61% COMPARISON:  12/23/2014 FINDINGS: Motion artifact limits evaluation of lung bases. Postoperative changes in the mediastinum. Mild diffuse fatty infiltration of the liver. Scattered calcified granulomas in the liver and spleen. Fatty infiltration of the pancreas. Small cysts in the kidneys. No hydronephrosis. Nephrograms are symmetrical. Calcification of aorta without aneurysm. No adrenal gland nodules. Normal caliber IVC. No retroperitoneal lymphadenopathy. Stomach, small bowel, and colon are not abnormally distended. No free air or free fluid in the abdomen. Pelvis: Appendix is normal. Prostate gland is enlarged, measuring 5.8 cm diameter. The mid and inferior portions of the rectus abdominus muscles bilaterally are expanded with infiltration in the fat around the rectus abdominus muscles and in the anterior low pelvis. This suggest rectus muscle hematomas, possibly due to muscle injury or tear. There is pooling of contrast material within the rectus abdominus muscles, more on the left. This may indicate active contrast extravasation. Bladder wall is not thickened. Postoperative changes in the right hip with intra medullary rod and compression bolt fixation. There is lucency around the visualized compression bolt and rod. This  could indicate loosening of hardware. Degenerative changes in the spine and hips. Bilateral inguinal hernias containing fat. IMPRESSION: Bilateral inferior rectus abdominus muscle hematomas with soft tissue hematoma in the surrounding fat and anterior pelvic wall. Puddling of contrast material in the inferior rectus muscles bilaterally but greater on the left may indicate active extravasation. Additional incidental findings as discussed in the body of report. These results will be called to the ordering clinician or representative by the Radiologist Assistant, and communication documented in the PACS or zVision Dashboard. Electronically Signed   By: Lucienne Capers M.D.   On: 07/03/2015 01:18    Micro Results      Recent Results (from the past 240 hour(s))  Culture, group A strep     Status: None   Collection Time: 07/01/15  7:31 PM  Result Value Ref Range Status   Specimen Description THROAT  Final   Special Requests NONE  Final   Culture NO GROUP A STREP (S.PYOGENES) ISOLATED  Final   Report Status 07/04/2015 FINAL  Final  Blood culture (routine x 2)     Status: Abnormal   Collection Time: 07/01/15  8:07 PM  Result Value Ref Range Status   Specimen Description BLOOD RIGHT ANTECUBITAL  Final   Special Requests BOTTLES DRAWN AEROBIC AND ANAEROBIC 5CC  Final   Culture  Setup Time   Final    GRAM POSITIVE COCCI IN CLUSTERS CRITICAL RESULT CALLED TO, READ BACK BY AND VERIFIED WITH: DR. Candiss Norse AT [id number] ON [id number] BY S. YARBROUGH IN BOTH AEROBIC AND ANAEROBIC BOTTLES    Culture (A)  Final    STAPHYLOCOCCUS SPECIES (COAGULASE NEGATIVE) SUSCEPTIBILITIES PERFORMED ON PREVIOUS CULTURE WITHIN THE LAST 5 DAYS.    Report Status 07/06/2015 FINAL  Final  Urine culture     Status: None   Collection Time: 07/01/15  8:10 PM  Result Value Ref Range Status   Specimen Description URINE, CLEAN CATCH  Final   Special Requests NONE  Final   Culture MULTIPLE SPECIES PRESENT, SUGGEST RECOLLECTION  Final    Report Status 07/03/2015 FINAL  Final  Blood culture (routine x 2)     Status: None   Collection Time: 07/01/15  9:23 PM  Result Value Ref Range Status   Specimen Description BLOOD RIGHT FOREARM  Final   Special Requests BOTTLES DRAWN AEROBIC AND ANAEROBIC 5CC  Final   Culture NO GROWTH 5 DAYS  Final   Report Status 07/06/2015 FINAL  Final  Culture, blood (Routine X 2) w Reflex to ID Panel     Status: Abnormal   Collection Time: 07/03/15 12:01 AM  Result Value Ref Range Status   Specimen Description BLOOD RIGHT ANTECUBITAL  Final   Special Requests BOTTLES DRAWN AEROBIC AND ANAEROBIC 5CC  Final   Culture  Setup Time   Final    GRAM POSITIVE COCCI ANAEROBIC BOTTLE ONLY CRITICAL RESULT CALLED TO, READ BACK BY AND VERIFIED WITH: DR [facility] 07/05/15 @ 24 M VESTAL    Culture STAPHYLOCOCCUS SPECIES (COAGULASE NEGATIVE) (A)  Final   Report Status 07/06/2015 FINAL  Final   Organism ID, Bacteria STAPHYLOCOCCUS SPECIES (COAGULASE NEGATIVE)  Final      Susceptibility   Staphylococcus species (coagulase negative) - MIC*    CIPROFLOXACIN <=0.5 SENSITIVE Sensitive     ERYTHROMYCIN >=8 RESISTANT Resistant     GENTAMICIN <=0.5 SENSITIVE Sensitive     OXACILLIN >=4 RESISTANT Resistant     TETRACYCLINE 2 SENSITIVE Sensitive     VANCOMYCIN 1 SENSITIVE Sensitive     TRIMETH/SULFA <=10 SENSITIVE Sensitive     CLINDAMYCIN <=0.25 SENSITIVE Sensitive     RIFAMPIN <=0.5 SENSITIVE Sensitive     Inducible Clindamycin NEGATIVE Sensitive     * STAPHYLOCOCCUS SPECIES (COAGULASE NEGATIVE)  Blood culture (routine x 2)     Status: None   Collection Time: 07/03/15  1:27 AM  Result Value Ref Range Status   Specimen Description BLOOD RIGHT HAND  Final   Special Requests IN PEDIATRIC BOTTLE 4CC  Final   Culture NO GROWTH 5 DAYS  Final   Report Status 07/08/2015 FINAL  Final  Culture, blood (routine x 2)     Status: None (Preliminary result)   Collection Time: 07/05/15 11:38 AM  Result Value Ref Range Status    Specimen Description BLOOD LEFT HAND  Final   Special Requests BOTTLES DRAWN AEROBIC ONLY 10CC  Final   Culture NO GROWTH 3 DAYS  Final   Report Status PENDING  Incomplete  Culture, blood (routine x 2)     Status: None (Preliminary result)   Collection Time: 07/05/15 11:44 AM  Result Value Ref Range Status   Specimen Description BLOOD RIGHT HAND  Final   Special Requests IN PEDIATRIC BOTTLE 2CC  Final   Culture NO GROWTH 3 DAYS  Final   Report Status PENDING  Incomplete       Today   Subjective    Gregory Wiley today has no headache,no chest abdominal pain,no new weakness tingling or numbness, feels much better wants to go home today.     Objective   Blood pressure 120/60, pulse 71, temperature 98.1 F (36.7 C), temperature source Oral, resp. rate 18, height 6' (1.829 m), weight 97.16 kg (214 lb 3.2 oz), SpO2 95 %.   Intake/Output Summary (Last 24 hours) at 07/09/15 1045 Last data filed at 07/09/15 0953  Gross per 24 hour  Intake    100 ml  Output   1000 ml  Net   -900 ml    Exam Awake Alert, Oriented x 3, No new F.N deficits, Chronic left-sided hemiparesis, Normal  affect [place].AT,PERRAL, left-sided submandibular swelling much improved Supple Neck,No JVD, No cervical lymphadenopathy appriciated.  Symmetrical Chest wall movement, Good air movement bilaterally, CTAB RRR,No Gallops,Rubs or new Murmurs, No Parasternal Heave +ve B.Sounds, Abd Soft, Non tender, No organomegaly appriciated, No rebound -guarding or rigidity. No Cyanosis, Clubbing or edema, No new Rash or bruise   Data Review   CBC w Diff:  Lab Results  Component Value Date   WBC 4.8 07/08/2015   HGB 7.9* 07/09/2015   HCT 25.2* 07/09/2015   PLT 108* 07/08/2015   LYMPHOPCT 20 07/02/2015   MONOPCT 6 07/02/2015   EOSPCT 0 07/02/2015   BASOPCT 0 07/02/2015    CMP:  Lab Results  Component Value Date   NA 139 07/09/2015   K 2.8* 07/09/2015   CL 107 07/09/2015   CO2 21* 07/09/2015   BUN 5* 07/09/2015    CREATININE 0.68 07/09/2015   CREATININE 0.83 09/13/2014   PROT 6.4* 07/02/2015   ALBUMIN 3.1* 07/02/2015   BILITOT 0.6 07/02/2015   ALKPHOS 50 07/02/2015   AST 26 07/02/2015   ALT 14* 07/02/2015  .   Total Time in preparing paper work, data evaluation and todays exam - 35 minutes  Gregory Wiley M.D on 07/09/2015 at 10:45 AM  [facility]   Office  [phone]

## 2015-07-08 NOTE — Clinical Social Work Placement (Signed)
   CLINICAL SOCIAL WORK PLACEMENT  NOTE  Date:  07/08/2015  Patient Details  Name: Gregory Wiley MRN: [id number] Date of Birth: [birth year]  Clinical Social Work is seeking post-discharge placement for this patient at the [place] level of care (*CSW will initial, date and re-position this form in  chart as items are completed):      Patient/family provided with [facility] Work Department's list of facilities offering this level of care within the geographic area requested by the patient (or if unable, by the patient's family).      Patient/family informed of their freedom to choose among providers that offer the needed level of care, that participate in Medicare, Medicaid or managed care program needed by the patient, have an available bed and are willing to accept the patient.      Patient/family informed of [facility]'s ownership interest in [facility], as well as of the fact that they are under no obligation to receive care at these facilities.  PASRR submitted to EDS on       PASRR number received on       Existing PASRR number confirmed on 07/08/15     FL2 transmitted to all facilities in geographic area requested by pt/family on 07/08/15     FL2 transmitted to all facilities within larger geographic area on       Patient informed that his/her managed care company has contracts with or will negotiate with certain facilities, including the following:            Patient/family informed of bed offers received.  Patient chooses bed at       Physician recommends and patient chooses bed at      Patient to be transferred to   on  .  Patient to be transferred to facility by       Patient family notified on   of transfer.  Name of family member notified:        PHYSICIAN       Additional Comment:    _______________________________________________ Benard Halsted, [place] 07/08/2015, 4:25 PM

## 2015-07-08 NOTE — Progress Notes (Signed)
Per RNCM, patient now wanting to go to SNF. CSW faxed out referral and will await bed availability. [facility] is not able to accept new weekend admissions.  Percell Locus Shaela Boer LCSWA [phone]

## 2015-07-08 NOTE — Care Management Note (Signed)
Case Management Note  Patient Details  Name: RODNER KUMMER MRN: [id number] Date of Birth: 05/24/1950  Subjective/Objective:   65 y.o. M admitted 07/02/2015 with  Rectus sheath hematoma with blood loss related anemia. Chronic Xarelto use  for hx PE. Had been treated for Tonsilitis with accompanying cough which precipitated Hematoma.   Pt has hx CVA with LSW and ambulates with QUAD cane pta. To be discharged to home today with [facility], PT, Aide and IV Abx. Spoke with pt and spouse, who informs CM that she has MS and cannot assist pt in the home. This couple has chosen  [facility] to provide [facility] services. Pt does not want HHPT yet wife does not feel pt is able to ambulate enough to be discharged home today. CM reviewed chart which reveals he has refused PT over the past week. Opposed to STSNF which is what the wife states she thinks he needs in order to get strong enough to return home. Will have PT re evaluate for alternative discharge plan. In the meantime I ;have spoken with Tiffany at [facility] to begin to arrange IV abx, and HHRN. They are able to have someone in the home as early as 0800 07/09/2015. Will speak with the pharmacy after PT evaluation to make more definite arrangements if needed.                 Action/Plan: Will continue to follow.    Expected Discharge Date:                  Expected Discharge Plan:  Home/Self Care  In-House Referral:     Discharge planning Services  CM Consult  Post Acute Care Choice:  Home Health Choice offered to:  Patient, Spouse (Wife at bedside does not believe she can assist pt  in the home)  DME Arranged:   (has 4 pronged [place] at home) DME Agency:     [facility] [place] Arranged:  RN, PT, Nurse's Aide, IV Antibiotics HH Agency:  [place]  Status of Service:  In process, will continue to follow  Medicare Important Message Given:  Yes Date Medicare IM Given:    Medicare IM give by:    Date Additional Medicare IM Given:    Additional Medicare Important Message  give by:     If discussed at [place] of Stay Meetings, dates discussed:    Additional Comments:  Delrae Sawyers, RN 07/08/2015, 11:16 AM

## 2015-07-08 NOTE — NC FL2 (Signed)
[place] LEVEL OF CARE SCREENING TOOL     IDENTIFICATION  Patient Name: Gregory Wiley Birthdate: 10-21-1950 Sex: male Admission Date (Current Location): 07/02/2015  [facility] and [place] Number:  Herbalist and Address:  [facility] [place] [address], [place], [place] [address]      Provider Number: [id number]  Attending Physician Name and Address:  Thurnell Lose, MD  Relative Name and Phone Number:  [name], spouse, [phone]    Current Level of Care: Hospital Recommended Level of Care: [place] Prior Approval Number:    Date Approved/Denied:   PASRR Number: [id number]  Discharge Plan: SNF    Current Diagnoses: Patient Active Problem List   Diagnosis Date Noted  . Bacteremia due to Gram-positive bacteria 07/03/2015  . Abdominal pain 07/03/2015  . Fever 07/03/2015  . Rectus sheath hematoma 07/03/2015  . Nontraumatic rectus hematoma 07/03/2015  . Adenitis 07/02/2015  . Acute tonsillitis 07/02/2015  . Chronic anticoagulation 05/22/2015  . Sneezing 05/21/2015  . Gait disturbance 05/15/2015  . Left shoulder pain 05/15/2015  . Pathologic fracture of femur ([place]) 02/07/2015  . DNR (do not resuscitate) 01/19/2015  . Syncope 01/19/2015  . Hypotension 01/14/2015  . Numbness and tingling of left side of face   . Sepsis ([place])   . Cardiomyopathy, ischemic   . Hypomagnesemia   . 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 Sinus tachycardia by electrocardiogram 12/04/2014  . Other headache syndrome 11/30/2014  . Occipital neuralgia of left side 11/30/2014  . OSA (obstructive sleep apnea) 11/30/2014  . Protein-calorie malnutrition, severe ([place]) 11/12/2014  . Chronic vomiting   . Loss of weight   . Chronic constipation   . Dehydration 11/11/2014  . Chronic combined systolic and diastolic congestive heart failure ([place]) 11/11/2014  . Nausea and vomiting 11/11/2014  . Hypokalemia   . Dyslipidemia   .  Bilateral pulmonary embolism ([place]) 04/11/2014  . Hemiplegia affecting left dominant side ([place]) 02/16/2014  . 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 CAD, RCA PCI '09, 10/11, CABG X 4 5/12 06/30/2011    Orientation RESPIRATION BLADDER Height & Weight     Self, Time, Situation, Place  Normal, Other (Comment) (Has CPAP. Will need one at the SNF.) Continent Weight: 214 lb 3.2 oz (97.16 kg) Height:  6' (182.9 cm)  BEHAVIORAL SYMPTOMS/MOOD NEUROLOGICAL BOWEL NUTRITION STATUS      Continent Diet (Please see DC summary)  AMBULATORY STATUS COMMUNICATION OF NEEDS Skin   Limited Assist Verbally Normal                       Personal Care Assistance Level of Assistance  Bathing, Feeding, Dressing Bathing Assistance: Limited assistance Feeding assistance: Independent Dressing Assistance: Limited assistance     Functional Limitations Info             SPECIAL CARE FACTORS FREQUENCY  PT (By licensed PT)     PT Frequency: min 3x/week              Contractures      Additional Factors Info  Code Status, Allergies, Psychotropic Code Status Info: Full Allergies Info: Diphenhydramine Hcl, Adhesive Psychotropic Info: Zoloft         Current Medications (07/08/2015):  This is the current hospital active medication list Current Facility-Administered Medications  Medication Dose Route Frequency Provider Last Rate Last Dose  . ALPRAZolam (XANAX) tablet 0.125 mg  0.125 mg Oral  Q8H PRN Lily Kocher, MD      . amoxicillin (AMOXIL) capsule 500 mg  500 mg Oral Q12H Lauren D Bajbus, RPH   500 mg at 07/08/15 0923  . ferrous sulfate tablet 325 mg  325 mg Oral TID WC Thurnell Lose, MD   325 mg at 07/08/15 1251  . fludrocortisone (FLORINEF) tablet 0.1 mg  0.1 mg Oral BID Lily Kocher, MD   0.1 mg at 07/08/15 0923  . guaiFENesin-codeine 100-10 MG/5ML solution 10 mL  10 mL Oral Q6H Thurnell Lose, MD   10 mL at 07/08/15 1551  . HYDROcodone-acetaminophen  (NORCO/VICODIN) 5-325 MG per tablet 1-2 tablet  1-2 tablet Oral Q6H PRN Lily Kocher, MD   2 tablet at 07/04/15 0440  . midodrine (PROAMATINE) tablet 2.5 mg  2.5 mg Oral TID WC Lily Kocher, MD   2.5 mg at 07/08/15 1349  . morphine 2 MG/ML injection 2 mg  2 mg Intravenous Q4H PRN Lily Kocher, MD   2 mg at 07/03/15 0112  . nitroGLYCERIN (NITROSTAT) SL tablet 0.4 mg  0.4 mg Sublingual Q5 min PRN Lily Kocher, MD      . ondansetron [facility]) injection 4 mg  4 mg Intravenous Q6H PRN Thurnell Lose, MD   4 mg at 07/04/15 2144  . pantoprazole (PROTONIX) EC tablet 40 mg  40 mg Oral BID Lily Kocher, MD   40 mg at 07/08/15 0921  . potassium chloride SA (K-DUR,KLOR-CON) CR tablet 40 mEq  40 mEq Oral Daily Lily Kocher, MD   40 mEq at 07/08/15 [id number]  . rosuvastatin (CRESTOR) tablet 40 mg  40 mg Oral QHS Lily Kocher, MD   40 mg at 07/07/15 2322  . senna (SENOKOT) tablet 8.6 mg  1 tablet Oral BID Lily Kocher, MD   8.6 mg at 07/08/15 [id number]  . senna-docusate (Senokot-S) tablet 1 tablet  1 tablet Oral BID Lily Kocher, MD   1 tablet at 07/08/15 [phone]  . sertraline (ZOLOFT) tablet 50 mg  50 mg Oral Daily Lily Kocher, MD   50 mg at 07/08/15 [id number]  . sodium chloride flush (NS) 0.9 % injection 10-40 mL  10-40 mL Intracatheter PRN Thurnell Lose, MD   10 mL at 07/08/15 0532  . tiZANidine (ZANAFLEX) tablet 4 mg  4 mg Oral BID PRN Lily Kocher, MD   4 mg at 07/06/15 2227  . vancomycin (VANCOCIN) IVPB 1000 mg/200 mL premix  1,000 mg Intravenous Q12H Lauren D Bajbus, [place]         Discharge Medications: Please see discharge summary for a list of discharge medications.  Relevant Imaging Results:  Relevant Lab Results:   Additional Information SSN: [place], [name]

## 2015-07-08 NOTE — Progress Notes (Signed)
Attempted to ambulate patient with aid of NT, patient was only able to walk 2-3 feet. Had to sit in chair. Was angry towards staff and family member in the room. Family member concerned about going home. Patient unable to walk far enough to get to bed or bathroom at home. Family member wants patient to go to SNF for rehab. Care Manager was in room at this time and is working on [facility] for IV abx. Waiting for physical therapy to come by and reassess before discharge. Joslyn Hy, MSN, RN, [employer]

## 2015-07-08 NOTE — Progress Notes (Signed)
Long talk with pt and wife to discuss PT recommendations : STSNF. Pt has No ability to be able to be at home with wife who is unable to assist pt and has no assistance in the community, has no faith community or family in the area. Pt and wife are agreeable to talking with CSW, Percell Locus who I called and notified, about STSNF placement. They are especially interested in [facility] which is close to where they live. CM made it very clear that another day in the hospital will not make that much difference in pts ability to ambulate. Both are more agreeable than before but in no way pleased.

## 2015-07-08 NOTE — Progress Notes (Signed)
Patient offered to walk, per Sonia Baller NT. Patient refused at this time. Joslyn Hy, MSN, RN, [employer]

## 2015-07-08 NOTE — Progress Notes (Signed)
Returned patient to bed. Chair was sitting beside bed. Patient could stand, but once chair moved could not ambulate to bed. Had to move bed towards patient to sit. Unable to slide up in bed. 2+ assist to perform these task with patient. Joslyn Hy, MSN, RN, [employer]

## 2015-07-08 NOTE — Progress Notes (Signed)
Physical Therapy Treatment Patient Details Name: INDIO SAMP MRN: [id number] DOB: 1951/01/19 Today's Date: 07/08/2015    History of Present Illness Pt adm with rectus abdominus hematome. PMH - Rt CVA with residual lt weakness. PE, HTN, orhtostatic    PT Comments    On arrival, wife was not here. Patient agreeable to get OOB for lunch. Clarified pt's prior functional status (uses hospital bed with rail, lift chair, and quad cane independently). As pt approached upright sitting at EOB, he screamed out "I can't do it! My head!" Patient yelling at therapist that his head hurts due to "lack of oxygen" (all parameters WNL). Calmed patient, explained PT is his "ticket out of here" and need to show me what he can do. He politely continued to refuse. Discussed discharge recommendations and he continues to refuse any follow up therapy. Wife arrived and discussed all that occurred. She states he needs to be able to get out of bed and step 5 steps into bathroom on his own for her to be able to care for him at home. He continued to decline to try to get OOB.    Follow Up Recommendations  SNF would be best plan,however currently refusing SNF (or even HHPT))     Equipment Recommendations  None recommended by PT    Recommendations for Other Services       Precautions / Restrictions Precautions Precautions: Fall    Mobility  Bed Mobility Overal bed mobility: Needs Assistance Bed Mobility: Rolling;Sidelying to Sit;Sit to Sidelying Rolling: Modified independent (Device/Increase time) Sidelying to sit: Min assist     Sit to sidelying: Modified independent (Device/Increase time) General bed mobility comments: Pt has hospital bed with rail; he used controls to set similar to his bed; only needed assist to clear legs of bottom bed rail (which he does not have at home)  Transfers                 General transfer comment: refused due to head hurting "from lack of oxygen!"  Ambulation/Gait                 Stairs            Wheelchair Mobility    Modified Rankin (Stroke Patients Only)       Balance                                    Cognition Arousal/Alertness: Awake/alert Behavior During Therapy: Agitated Overall Cognitive Status: History of cognitive impairments - at baseline                      Exercises      General Comments        Pertinent Vitals/Pain Pain Assessment: Faces Faces Pain Scale: Hurts whole lot Pain Location: head Pain Descriptors / Indicators: Aching Pain Intervention(s): Limited activity within patient's tolerance;Repositioned    Home Living                      Prior Function            PT Goals (current goals can now be found in the care plan section) Acute Rehab PT Goals Patient Stated Goal: return home Time For Goal Achievement: 07/11/15 Progress towards PT goals: Not progressing toward goals - comment    Frequency  Min 3X/week    PT Plan Discharge plan needs to  be updated    Co-evaluation             End of Session   Activity Tolerance: Patient limited by pain Patient left: with call bell/phone within reach;in bed;with bed alarm set;with family/visitor present;with SCD's reapplied     Time: [id number] PT Time Calculation (min) (ACUTE ONLY): 25 min  Charges:  $Therapeutic Activity: 23-37 mins                    G Codes:      Manjot Hinks 07/21/2015, 12:14 PM Pager [phone]

## 2015-07-08 NOTE — Progress Notes (Signed)
Pharmacy Antibiotic Note Gregory Wiley is a 65 y.o. male admitted early 4/16 for acute tonsillitis and subsequently discharged home on Augmentin that was called back to hospital 4/16 pm with [facility] in clusters blood cx's. Pharmacy asked to dose vancomycin, also now to switch from Unasyn to PO pencillin for strep throat/tonsillitis.  Plan: Reduce vancomycin 1000 mg IV q12h to avoid accumulation Amoxicillin 500mg  PO BID   Temp (24hrs), Avg:98.2 F (36.8 C), Min:98.1 F (36.7 C), Max:98.5 F (36.9 C)   Recent Labs Lab 07/01/15 2018  07/02/15 2127 07/03/15 0127 07/03/15 0556 07/04/15 0835 07/05/15 0655 07/06/15 0517 07/07/15 0515 07/08/15 0532 07/08/15 1428  WBC  --   < > 4.5  --  4.2 3.7* 3.5* 3.9* 4.3 4.8  --   CREATININE  --   < > 0.96  --  0.88 0.92 0.74  --   --   --  0.71  LATICACIDVEN 1.78  --   --  1.6  --   --   --   --   --   --   --   VANCOTROUGH  --   --   --   --   --   --   --   --   --   --  19  < > = values in this interval not displayed.  Estimated Creatinine Clearance: 111.2 mL/min (by C-G formula based on Cr of 0.71).    Antimicrobials this admission: Vancomycin 4/16 >> (14 day course planned per 4/21 note) Unasyn 4/17>> 4/21 Amoxicillin 4/21>>(4/26- not yet entered)  Dose adjustments this admission: 4/22 VT = 89mcg/mL on 1250mg  IV q12h, reduced to 1g IV q12h  Microbiology results: 4/19 BCx- ngtd 4/17 Blood - 1/2 CoNS, R erthyro and oxacillin 4/15 Blood - neg 4/15 Urine - multiple species 4/15 GAS, throat - negative  Thank you for allowing pharmacy to be a part of this patient's care.  Nijah Tejera D. Itzell Bendavid, PharmD, BCPS Clinical Pharmacist Pager: [phone] 07/08/2015 3:50 PM

## 2015-07-08 NOTE — Discharge Instructions (Signed)
Follow with Primary MD [name], MD in 2-3 days   Get CBC, CMP, 2 view Chest X ray checked  by Primary MD next visit.    Activity: As tolerated with Full fall precautions use walker/cane & assistance as needed   Disposition Home     Diet:   Heart Healthy    For Heart failure patients - Check your Weight same time everyday, if you gain over 2 pounds, or you develop in leg swelling, experience more shortness of breath or chest pain, call your Primary MD immediately. Follow Cardiac Low Salt Diet and 1.5 lit/day fluid restriction.   On your next visit with your primary care physician please Get Medicines reviewed and adjusted.   Please request your Prim.MD to go over all Hospital Tests and Procedure/Radiological results at the follow up, please get all Hospital records sent to your Prim MD by signing hospital release before you go home.   If you experience worsening of your admission symptoms, develop shortness of breath, life threatening emergency, suicidal or homicidal thoughts you must seek medical attention immediately by calling 911 or calling your MD immediately  if symptoms less severe.  You Must read complete instructions/literature along with all the possible adverse reactions/side effects for all the Medicines you take and that have been prescribed to you. Take any new Medicines after you have completely understood and accpet all the possible adverse reactions/side effects.   Do not drive, operating heavy machinery, perform activities at heights, swimming or participation in water activities or provide baby sitting services if your were admitted for syncope or siezures until you have seen by Primary MD or a Neurologist and advised to do so again.  Do not drive when taking Pain medications.    Do not take more than prescribed Pain, Sleep and Anxiety Medications  Special Instructions: If you have smoked or chewed Tobacco  in the last 2 yrs please stop smoking, stop any  regular Alcohol  and or any Recreational drug use.  Wear Seat belts while driving.   Please note  You were cared for by a hospitalist during your hospital stay. If you have any questions about your discharge medications or the care you received while you were in the hospital after you are discharged, you can call the unit and asked to speak with the hospitalist on call if the hospitalist that took care of you is not available. Once you are discharged, your primary care physician will handle any further medical issues. Please note that NO REFILLS for any discharge medications will be authorized once you are discharged, as it is imperative that you return to your primary care physician (or establish a relationship with a primary care physician if you do not have one) for your aftercare needs so that they can reassess your need for medications and monitor your lab values.

## 2015-07-09 LAB — BASIC METABOLIC PANEL
Anion gap: 11 (ref 5–15)
BUN: 5 mg/dL — ABNORMAL LOW (ref 6–20)
CO2: 21 mmol/L — ABNORMAL LOW (ref 22–32)
Calcium: 7.6 mg/dL — ABNORMAL LOW (ref 8.9–10.3)
Chloride: 107 mmol/L (ref 101–111)
Creatinine, Ser: 0.68 mg/dL (ref 0.61–1.24)
GFR calc Af Amer: >60 mL/min (ref >60)
GFR calc non Af Amer: >60 mL/min (ref >60)
Glucose, Bld: 91 mg/dL (ref 65–99)
Potassium: 2.8 mmol/L — ABNORMAL LOW (ref 3.5–5.1)
Sodium: 139 mmol/L (ref 135–145)

## 2015-07-09 LAB — HEMOGLOBIN AND HEMATOCRIT, BLOOD
HCT: 25.2 % — ABNORMAL LOW (ref 39.0–52.0)
Hemoglobin: 7.9 g/dL — ABNORMAL LOW (ref 13.0–17.0)

## 2015-07-09 LAB — MAGNESIUM: Magnesium: 1.7 mg/dL (ref 1.7–2.4)

## 2015-07-09 MED ORDER — HEPARIN SOD (PORK) LOCK FLUSH 100 UNIT/ML IV SOLN
250.0000 [IU] | INTRAVENOUS | Status: AC | PRN
  Administered 2015-07-09: 250 [IU]

## 2015-07-09 MED ORDER — MAGNESIUM SULFATE IN D5W 10-5 MG/ML-% IV SOLN
1.0000 g | Freq: Once | INTRAVENOUS | Status: AC
  Administered 2015-07-09: 1 g via INTRAVENOUS
  Filled 2015-07-09: qty 100

## 2015-07-09 MED ORDER — POTASSIUM CHLORIDE 10 MEQ/100ML IV SOLN
10.0000 meq | INTRAVENOUS | Status: AC
  Administered 2015-07-09 (×3): 10 meq via INTRAVENOUS
  Filled 2015-07-09 (×4): qty 100

## 2015-07-09 NOTE — Progress Notes (Signed)
Patient refuses CPAP 

## 2015-07-09 NOTE — Social Work (Signed)
CSW met with patient and wife and daughter with Charge RN present. CSW shared Bed Offers: [facility], [facility], [place] and [facility]. Wife stated that she refused [facility]. But no comments were made on the other placement options. Daughter stated that she had several questions that she wanted addressed and would like to appeal d/c if those issues about patient medical care and treatment/followup are not addressed.  CSW made RNCM aware of patient's family plan to appeal discharge.  Christene Lye MSW, LCSW

## 2015-07-09 NOTE — Progress Notes (Signed)
1600 NCM spoke to pt, dtr, Andee Poles and wife, [name] # [phone] at bedside. They are having trying to make decision about SNF vs Home with HH. States pt's wife is currently not feeling well and plan was to take her to urgent care for treatment. She did not feel she could manage pt's care at home.   1700 NCM did follow up with dtr and plan is to dc home. [facility] Liaison made aware. They will work out delivery schedule for IV abx. Jonnie Finner RN CCM Case Mgmt phone [phone]

## 2015-07-09 NOTE — Progress Notes (Signed)
Asked by Dr Cordelia Pen provide a second opinion at the request of family. I have reviewed the chart extensively, and then subsequently discussed with patient and family at bedside with charge RN-Shinita Olena Heckle. Patient's family had questions regarding his admitting diagnoses,  timing to restart anticoagulation, rationale for IV antibiotics, and whether patient's cardiologist/Neurologist were aware of his current condition.  In short, patient is a 65 year old male with history of paroxysmal atrial fibrillation on anticoagulation-admitted with rectus sheath hematoma and acute blood loss anemia. Hospital course has been complicated by positive blood cultures with coag-negative staph. Anticoagulation and antiplatelet agents have been appropriately placed on hold with plans to restart Xarelto on 4/26 and ASA on 4/25.Hemoglobin although low has been persistently stable. Patient does not complain of abdominal pain or any abdominal swelling. After discussion with infectious disease by Dr Candiss Norse, recommendations were to treat for a total of 14 days. Patient did not want to go to SNF, family overwhelmed in taking care of him home with IV antibiotics and with these new medical issues.  Have explained to patient's family that patient has a rectus sheath hematoma that likely occurred from excessive coughing while on anticoagulation and aspirin. Sometimes this occurs spontaneously as well. Have explained that in most cases this just requires supportive care and discontinuation of anticoagulation temporarily.  In regards to anticoagulation-explained to the patient's daughter and spouse at bedside that there are no clear-cut guidelines regarding when to restart anticoagulation in this setting, but holding anticoagulation for a short duration of 1-2 weeks is very reasonable. Explained to both patient and family that we could restart anticoagulation at any moment since his hemoglobin was stable if patient and family were willing  to except risk of rebleeding, but after discussion, both patient and family were okay with restarting anticoagulation on 4/26 as outlined in the discharge summary by Dr. Candiss Norse. They are well aware of this difficult situation and aware of stroke/embolic risk in the meantime.Furthermore-I discussed case with cardiologist on call-Dr Croitoru-who reviewed the chart and was agreeable with the above outlined plan.  In regards to the [place] explained to family that infectious disease is recommending 2 weeks of IV vancomycin. Family inquiring if patient can be switched to oral antibiotics. Although we could potentially switch to Zyvox-ideal treatment would be with IV antibiotics. I subsequently spoke with patient, explained that treatment is ideal with vancomycin intravenously, but if he was willing to accept risks of persistent infection that would cause life threatening and life disabling risk, we could potentially change him to an oral regimen. After discussion- patient would like to continue planned course of IV antibiotics for a total of 2 weeks.  Also after much discussion, patient was although reluctant-was agreeable to go to SNF.  At this time, plans outlined by Dr. Candiss Norse in the discharge summary is very reasonable- which I agree with. This was conveyed to the patient and the family.

## 2015-07-09 NOTE — Progress Notes (Signed)
Went back to speak to patient and let him know we are arranging discharge for home and patient ask that I speak to his wife over the phone. I spoke to wife and she had several concerns about not being able to care for the patient at home.  I explained to her that we agreed that patient needs rehab and however patient has the right and capacity to make his own decisions. And is is refusing SNF. Notified SW and patient has bed offers but they are not at [facility]. Wife is not happy with this and stated she will be here at 3 pm to speak with the MD . She feels he is discharging to early. I explained patient was medically stable.

## 2015-07-09 NOTE — Progress Notes (Signed)
Nsg Discharge Note  Admit Date:  07/02/2015 Discharge date: 07/09/2015                  [facility] to be D/C'd Home per MD order.  AVS completed.  Copy for chart, and copy for patient signed, and dated. Patient/caregiver able to verbalize understanding.  Discharge Medication:   Medication List    STOP taking these medications        amoxicillin-clavulanate 875-125 MG tablet  Commonly known as:  AUGMENTIN      TAKE these medications        acetaminophen 325 MG tablet  Commonly known as:  TYLENOL  Take 1 tablet (325 mg total) by mouth every 6 (six) hours as needed for mild pain.     ALLEGRA PO  Take 1 tablet by mouth daily as needed (seasonal allergies).     ALPRAZolam 0.25 MG tablet  Commonly known as:  XANAX  Take 0.5 tablets (0.125 mg total) by mouth every 8 (eight) hours as needed for anxiety.     amoxicillin 500 MG capsule  Commonly known as:  AMOXIL  Take 1 capsule (500 mg total) by mouth every 12 (twelve) hours.     aspirin EC 81 MG tablet  Take 1 tablet (81 mg total) by mouth daily.  Start taking on:  07/11/2015     colchicine 0.6 MG tablet  Take 0.6 mg by mouth as needed (for gout). Reported on 05/26/2015     ferrous sulfate 325 (65 FE) MG tablet  Take 1 tablet (325 mg total) by mouth 3 (three) times daily with meals.     fludrocortisone 0.1 MG tablet  Commonly known as:  FLORINEF  Take 1 tablet (0.1 mg total) by mouth 2 (two) times daily.     fluticasone 50 MCG/ACT nasal spray  Commonly known as:  FLONASE  Place 2 sprays into both nostrils at bedtime as needed (seasonal allergies).     guaiFENesin-codeine 100-10 MG/5ML syrup  Take 10 mLs by mouth every 6 (six) hours.     HYDROcodone-acetaminophen 5-325 MG tablet  Commonly known as:  NORCO  Take 1-2 tablets by mouth every 6 (six) hours as needed for moderate pain.     hydrocortisone 25 MG suppository  Commonly known as:  ANUSOL-HC  Place 25 mg rectally as needed for hemorrhoids or itching.     Melatonin 10 MG Caps  Take 10 mg by mouth at bedtime as needed (sleep).     methylPREDNISolone 4 MG Tbpk tablet  Commonly known as:  MEDROL DOSEPAK  follow package directions     midodrine 2.5 MG tablet  Commonly known as:  PROAMATINE  Take 1 tablet (2.5 mg total) by mouth 3 (three) times daily with meals.     nitroGLYCERIN 0.4 MG SL tablet  Commonly known as:  NITROSTAT  Place 1 tablet (0.4 mg total) under the tongue every 5 (five) minutes as needed. For chest pain     pantoprazole 40 MG tablet  Commonly known as:  PROTONIX  Take 1 tablet (40 mg total) by mouth 2 (two) times daily.     potassium chloride 10 MEQ tablet  Commonly known as:  K-DUR  TAKE 4 TABLETS (40 MEQ TOTAL) BY MOUTH DAILY.     PRESCRIPTION MEDICATION  Inhale into the lungs at bedtime. CPAP     rivaroxaban 10 MG Tabs tablet  Commonly known as:  XARELTO  Take 2 tablets (20 mg total) by mouth daily with supper.  Start taking on:  07/12/2015     rosuvastatin 40 MG tablet  Commonly known as:  CRESTOR  Take 1 tablet (40 mg total) by mouth daily.     senna 8.6 MG Tabs tablet  Commonly known as:  SENOKOT  Take 1 tablet by mouth 2 (two) times daily.     SENNA S 8.6-50 MG tablet  Generic drug:  senna-docusate  Take 1 tablet by mouth 2 (two) times daily.     sertraline 50 MG tablet  Commonly known as:  ZOLOFT  Take 1 tablet (50 mg total) by mouth daily.     tiZANidine 4 MG tablet  Commonly known as:  ZANAFLEX  Take 4 mg by mouth 2 (two) times daily as needed for muscle spasms.     vancomycin 1,250 mg in sodium chloride 0.9 % 250 mL  Inject 1,250 mg into the vein every 12 (twelve) hours. 10 day supply, HH RN to administer, draw levels 2/week to be followed by [facility].        Discharge Assessment: Filed Vitals:   07/09/15 0524 07/09/15 1318  BP: 120/60 143/77  Pulse: 71 75  Temp: 98.1 F (36.7 C) 98.7 F (37.1 C)  Resp: 18 19   Skin clean, dry and intact without evidence of skin break down,  no evidence of skin tears noted. IV catheter discontinued intact. Site without signs and symptoms of complications - no redness or edema noted at insertion site, patient denies c/o pain - only slight tenderness at site.  Dressing with slight pressure applied.  D/c Instructions-Education: Discharge instructions given to patient/family with verbalized understanding. D/c education completed with patient/family including follow up instructions, medication list, d/c activities limitations if indicated, with other d/c instructions as indicated by MD - patient able to verbalize understanding, all questions fully answered. Patient instructed to return to ED, call 911, or call MD for any changes in condition.  Patient escorted via [place], and D/C home via private auto.  Salley Slaughter, RN 07/09/2015 6:03 PM

## 2015-07-09 NOTE — Care Management Note (Signed)
Case Management Note  Patient Details  Name: Gregory Wiley MRN: [id number] Date of Birth: 1950-06-01                Action/Plan: Discharge Planning: AVS reviewed:  Pt has decided to [place] with [facility]. NCM contacted [facility] Liaison, requested NCM fax IV abx Rx to pharmacy [phone]. Unit RN will give dose this afternoon. [facility] will do a start of care on 07/10/2015 in am.    Expected Discharge Date:  07/09/2015               Expected Discharge Plan:  [place]  In-House Referral:  Clinical Social Work  Discharge planning Services  CM Consult  Post Acute Care Choice:  Home Health, Resumption of Svcs/PTA Provider Choice offered to:  Patient, Spouse (Wife at bedside does not believe she can assist pt  in the home)  DME Arranged:  N/A (has 4 pronged [place] at home) DME Agency:  NA  HH Arranged:  RN, PT, Nurse's Aide [place] Agency:  [facility]  Status of Service:  Completed, signed off  Medicare Important Message Given:  Yes Date Medicare IM Given:    Medicare IM give by:    Date Additional Medicare IM Given:    Additional Medicare Important Message give by:     If discussed at [place] of Stay Meetings, dates discussed:    Additional Comments:  Erenest Rasher, RN 07/09/2015, 12:37 PM

## 2015-07-09 NOTE — Care Management Important Message (Signed)
Important Message  Patient Details  Name: Gregory Wiley MRN: [id number] Date of Birth: 12/22/1950   Medicare Important Message Given:  Yes    Erenest Rasher, RN 07/09/2015, 5:11 PM

## 2015-07-09 NOTE — Progress Notes (Signed)
Spoke to patient in detail about his disposition plan and patient was persisant  about going home. Patient stated  if he could go to [facility] he would consider. Called Nurse Case manager and left message.

## 2015-07-09 NOTE — Social Work (Signed)
Patient has received 4 bed offers on 07/09/15. [place], [place] confirmed potential for admission on 07/09/15.  Social worker informed patient of above mentioned bed offers and patient stated that he was not interested and desired to go home. Patient stated that he has been home the last year and a half and has everything he needs at home. Patient stated that he recognizes his condition has declined but affirms that he wants to go home. Patient gave this CSW permission to share this information with wife and states that if wife does support SNF placement he would still decline.  [facility] also confirmed bed placement for 07/09/15. CSW will followup with facility of choice after meeting with patient and family.  CSW will meet with patient and wife at 3pm when wife arrives on the unit.   Christene Lye MSW, LCSW

## 2015-07-10 ENCOUNTER — Ambulatory Visit (INDEPENDENT_AMBULATORY_CARE_PROVIDER_SITE_OTHER): Payer: Medicare Other | Admitting: *Deleted

## 2015-07-10 DIAGNOSIS — I63411 Cerebral infarction due to embolism of right middle cerebral artery: Secondary | ICD-10-CM

## 2015-07-10 LAB — CULTURE, BLOOD (ROUTINE X 2)
Culture: NO GROWTH
Culture: NO GROWTH

## 2015-07-11 ENCOUNTER — Telehealth: Payer: Self-pay | Admitting: *Deleted

## 2015-07-11 NOTE — Telephone Encounter (Signed)
Transition Care Management Follow-up Telephone Call   Date discharged? 07/09/15   How have you been since you were released from the hospital? Improving.   Do you understand why you were in the hospital? yes   Do you understand the discharge instructions? yes   Where were you discharged to? home   Items Reviewed:  Medications reviewed: yes  Allergies reviewed: yes  Dietary changes reviewed: no  Referrals reviewed: cardiology, ENT, [place] for continued IV therapy   Functional Questionnaire:   Activities of Daily Living (ADLs):   He states they are independent in the following: ambulation, feeding, continence, grooming, toileting and dressing States they require assistance with the following: bathing and hygiene   Any transportation issues/concerns?: no   Any patient concerns? yes, Xarelto was changed from 20 mg to 10 mg but instructions say to take 2 tablets daily - advised to contact cardiology for clarifications on instructions   Confirmed importance and date/time of follow-up visits scheduled yes, 07/12/15 @ 1515  Provider Appointment booked with G. Renford Dills, MD  Confirmed with patient if condition begins to worsen call PCP or go to the ER.  Patient was given the office number and encouraged to call back with question or concerns.  : yes

## 2015-07-11 NOTE — Telephone Encounter (Signed)
Patient's wife is calling about dosage of Xarelto 10 mg. On discharge papers he is to take 10mg  daily  Resume xaralto at 10 mg daily in 5-7 days if no further evidence of bleed (rectus sheath bleed), resume aspirin in 3-4 days if stable H&H. But rx states 20mg  daily. Wife would like a call back to know which should he take, his one 10mg  tablet or two 10mg  tablets.

## 2015-07-11 NOTE — Progress Notes (Signed)
Carelink Summary Report / Loop Recorder 

## 2015-07-12 ENCOUNTER — Ambulatory Visit (INDEPENDENT_AMBULATORY_CARE_PROVIDER_SITE_OTHER): Payer: Medicare Other | Admitting: Family Medicine

## 2015-07-12 ENCOUNTER — Encounter: Payer: Self-pay | Admitting: Family Medicine

## 2015-07-12 VITALS — BP 116/64 | HR 66 | Temp 97.4°F | Ht 73.0 in | Wt 212.5 lb

## 2015-07-12 DIAGNOSIS — A499 Bacterial infection, unspecified: Secondary | ICD-10-CM

## 2015-07-12 DIAGNOSIS — J039 Acute tonsillitis, unspecified: Secondary | ICD-10-CM

## 2015-07-12 DIAGNOSIS — R7881 Bacteremia: Secondary | ICD-10-CM

## 2015-07-12 DIAGNOSIS — E876 Hypokalemia: Secondary | ICD-10-CM

## 2015-07-12 DIAGNOSIS — S301XXD Contusion of abdominal wall, subsequent encounter: Secondary | ICD-10-CM

## 2015-07-12 NOTE — Progress Notes (Signed)
Pre visit review using our clinic review tool, if applicable. No additional management support is needed unless otherwise documented below in the visit note.  Admission date: 07/02/2015 Admitting Physician Thurnell Lose, MD  Discharge Date: 07/09/2015   Primary MD [name], MD  Recommendations for primary care physician for things to follow:   Check CBC, BMP, magnesium in 2 days.  Resume xaralto at 10 mg daily in 5-7 days if no further evidence of bleed (rectus sheath bleed), resume aspirin in 3-4 days if stable H&H.  Outpatient follow-up with his primary cardiologist and ENT within a week   Admission Diagnosis PE (pulmonary embolism) [I26.99] Tonsillitis [J03.90] Lower abdominal pain [R10.30] Bacteremia due to Gram-positive bacteria [A49.9] Rectus sheath hematoma, initial encounter [S30.1XXA] Fever, unspecified fever cause [R50.9]   Discharge Diagnosis PE (pulmonary embolism) [I26.99] Tonsillitis [J03.90] Lower abdominal pain [R10.30] Bacteremia due to Gram-positive bacteria [A49.9] Rectus sheath hematoma, initial encounter [S30.1XXA] Fever, unspecified fever cause [R50.9]   Principal Problem:  Rectus sheath hematoma Active Problems:  Chronic anticoagulation  Acute tonsillitis  Bacteremia due to Gram-positive bacteria  Abdominal pain  Fever  Nontraumatic rectus hematoma    Past Medical History  Diagnosis Date  . Coronary artery disease     stent, then CABG 07/20/10  . Hypertension   . Hyperlipidemia   . Obesity (BMI 30-39.9)   . H/O cardiomyopathy     ischemic, now last echo 07/30/11, EF 38%W  . Diverticulosis   . Internal hemorrhoids   . Tubular adenoma of colon   . Plantar fasciitis of left foot   . Basal cell carcinoma of skin of left ankle   . NSTEMI (non-ST elevated myocardial infarction) ([place])     NSTEMI- last cath 06/2011-Stent to LCX-DES, last nuc 07/30/11 low risk  . MI (myocardial  infarction) ([place])     "I had 4 before I retired in 2006" (11/11/2014)  . Pulmonary embolism ([place]) 03/2014  . GERD (gastroesophageal reflux disease)   . Bleeding stomach ulcer 2015  . Daily headache     "for the past month" (11/11/2014)  . Stroke ([place]) 01/2014    "no control of LUE, can slightly LLE; memory problems since" (11/11/2014)  . Arthritis     "back" (11/11/2014)  . Gout   . Depression   . Pneumonia     hosp. 12/2014  . Anxiety   . History of hiatal hernia   . 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 OSA on CPAP     since July 2013- uses CPAP sometimes , states he has lost 147 lbs. since stroke & doesn't use the CPAP as much as he use to.   . Adrenal insufficiency ([place])   . Left hemiplegia [facility])     Past Surgical History  Procedure Laterality Date  . Coronary artery bypass graft  07/20/2010     LIMA-LAD; VG-ACUTE MARG of RCA; Seq VG-distal RCA & then pda  . Coronary angioplasty with stent placement  07/01/2011    DES-Resolute to native LCX  . Coronary angioplasty with stent placement  12/01/2007    COMPLEX 5 LESION PCI INCLUDING CUTTING BALLOON AND 4 CYPHER DESs  . Knee arthroscopy Right   . Hand surgery Right     to take glass out  . Colonoscopy N/A 02/10/2013    Procedure: COLONOSCOPY; Surgeon: Ladene Artist, MD; Location: [facility] ENDOSCOPY; Service: Endoscopy; Laterality: N/A;  . Retinal detachment surgery Right   . Loop recorder  implant  02/15/2014    MDT LINQ implanted by Dr Rayann Heman for cryptogenic stroke  . Tee without cardioversion N/A 02/15/2014    Procedure: TRANSESOPHAGEAL ECHOCARDIOGRAM (TEE); Surgeon: Josue Hector, MD; Location: [facility] ENDOSCOPY; Service: Cardiovascular; Laterality: N/A;  . Left heart catheterization with coronary/graft angiogram  07/01/2011    Procedure: LEFT HEART CATHETERIZATION WITH  Beatrix Fetters; Surgeon: Sanda Klein, MD; Location: [facility] CATH LAB; Service: Cardiovascular;;  . Percutaneous coronary stent intervention (pci-s) Right 07/01/2011    Procedure: PERCUTANEOUS CORONARY STENT INTERVENTION (PCI-S); Surgeon: Sanda Klein, MD; Location: [facility] CATH LAB; Service: Cardiovascular; Laterality: Right;  . Umbilical hernia repair  2006  . Hernia repair    . Basal cell carcinoma excision Left 2015    ankle  . Esophagogastroduodenoscopy  01/2014    gastric ulcer, erosive gastroduodenitis, H Pylori negative.   . Esophagogastroduodenoscopy (egd) with propofol N/A 11/14/2014    Procedure: ESOPHAGOGASTRODUODENOSCOPY (EGD) WITH PROPOFOL; Surgeon: Milus Banister, MD; Location: [facility]; Service: Endoscopy; Laterality: N/A;  . Intramedullary (im) nail intertrochanteric Right 02/07/2015    Procedure: INTRAMEDULLARY (IM) NAIL INTERTROCHANTRIC RIGHT HIP; Surgeon: Renette Butters, MD; Location: [facility]; Service: Orthopedics; Laterality: Right;  . Cardiac surgery        HPI from the history and physical done on the day of admission:   Gregory Wiley is a 65 y.o. gentleman with a history of CAD, HTN, HLD, adrenal insufficiency, PE (anticoagulated with Xarelto), prior CVA with left sided weakness, and OSA requiring CPAP who was admitted on 4/16 for management of acute tonsillitis. The patient was placed on IV unasyn. Blood cultures were drawn. He improved rapidly and was comfortable with discharge to home on Augmentin. Post discharge, he was notified of positive blood cultures, 1 of 2 bottles. It was felt that there was a strong likelihood that this finding would contamination, and there were no immediate plans to change treatment course while awaiting ID and sensitivities. The patient was advised to return to the ED for fever. He has developed fever and Now complains of increased lower abdomen/pelvic pain.  He has had some nausea but no vomiting. No diarrhea. No dysuria. No LOC.  Repeat blood cultures and IV vancomycin ordered by the ED physician. The patient was actually referred for admission before his CT A/P results were back. We subsequently learned that the patient has acute bilateral inferior rectus abdominus muscle hematomas with a soft tissue hematoma surrouding the fat and anterior pelvic wall. Puddling of contrast may represent active bleed. ED attending was asked to request formal consult from General Surgery. Dr. Ralene Ok is not recommending acute intervention tonight except abdominal binder (expecting that this type of bleed will be self-limited); we have been advised to call back in the morning to have the patient seen.    Hospital Course:    1.Rectus sheath hematoma with blood loss related anemia. Caused due to persistent cough with musculoskeletal strain in the setting of steroid to use. Hold xaralto, cough suppressant, supportive care, monitor H&H, type and screen. Discussed with both pulmonary physician on call Dr. Elsworth Soho and hematologist Dr. Beryle Beams, he will benefit from low-dose xaralto 10 mg daily once bleeding is stable In the next 3-4 days days after another CBC checked by PCP, risk of stroke and DVT PE during this period has been explained to patient and wife bedside, they accept the risks and benefits, agree and understand the plan. ED MD has discuused the case with surgery, abd binder and Med Rx, binder on patient.  2. Possible acute tonsillitis from strep Throat with reactive cervical submandibular lymphadenopathy left greater than right with bacteremia versus contamination - had shown much improvement with Unasyn/Augmentin We will transition to oral penicillin and complete ten-day treatment.  Once discharged a needs to follow-up with ENT one time in 1-2 weeks till his lymphadenopathy and left-sided neck swelling has resolved.   3. History of adrenal  insufficiency with hypotension in the ER. Resolved with IV fluids, he is already on midodrine and Florinef which will be continued, BP stable.  4. 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 Holding anticoagulation for now due to rectus sheath bleed, will monitor closely. SCDs for now. Stable baseline lower extremity venous duplex. Resume xaralto at 10 mg daily once H&H is stable in the next 2-3 days.  5. OSA. CPAP at night continue.  6. History of CVA with residual left-sided hemiparesis. At baseline. Resume aspirin and xaralto in 3-4 days by PCP if H&H remains stable.  7. Chronic anemia with some worsening due to blood loss in the rectus sheath hematoma. Anemia panel consistent with iron deficiency he received IV iron along with oral supplementation upon discharge.  8. 1/2 Blood cultures from 2 different sets positive for coag-negative staph. Third set unremarkable however this was on antibiotics, Discussed with ID physician Dr Johnnye Sima, since third set is negative will place PICC line and do 14 days IV vancomycin, home health RN and PICC line done, of note he had an nonacute echogram.  9. CAD with Chronic systolic heart failure with wall motion abnormalities. Echogram noted this admission with EF around 40%, echo from 2016 noted with EF 35% and similar wall motion abnormalities. He is compensated. Requested him to follow with his primary cardiologist within a week, continue statin for secondary prevention, due to adrenal insufficiency he takes midodrine and blood pressure is too low for beta blocker or ACE/ARB.  10. Hypokalemia. Replaced. Request PCP or SNF staff to recheck in 2-3 days with magnesium levels.   Note patient was discharged on 07/08/2015 however wife refused to take patient home citing that he was weak, note patient has refused PT evaluation on 07/05/2015, wife on that day said she does not want PT to see the patient either has she can provide him help at home. He is chronically weak and  deconditioned due to left-sided hemiparesis from previous stroke and recent illness.  After discharge patient was again seen by PT and he again refused SNF placement, this morning he again refuses any placement and wants to go to home, he says he doesn't even need home health PT but will consider if it is ordered, charge nurse also had detailed discussion with the patient who confirms the same. We will have case manager talk to the family and decide on disposition. If patient wishes to go to SNF and he hasn't a bed will go there or else he will go home with home PT.  After everything was explained to the family that patient cannot be forced against his wishes to go to a SNF, the stepdaughter asked that he be given another PRBC transfusion and CT be repeated and he be kept longer, she said she was a Therapist, sports and that was her medical opinion, she was told that HB stable > 7.0 and no indication for another CT, she then said she wants a 2nd medical opinion, it will be provided.    Follow UP  Follow-up Information    Follow up with [name], MD. Schedule an  appointment as soon as possible for a visit in 2 days.   Specialty: Family Medicine   Contact information:   [place] [id number] [phone]       Follow up with CROITORU,MIHAI, MD. Schedule an appointment as soon as possible for a visit in 1 week.   Specialty: Cardiology   Contact information:   [address] [place] [address] [phone]       Follow up with [facility].   Why: HHRN, Aide, IV Therapy   Contact information:   [place] [address] [phone]        Consults obtained - ID over the phone  Discharge Condition: Fair  Diet and Activity recommendation: See Discharge Instructions below  Discharge Instructions         Discharge Instructions    Diet - low sodium heart healthy   Complete by: As directed      Discharge instructions  Complete by: As directed   Follow with Primary MD [name], MD in 2-3 days   Get CBC, CMP, 2 view Chest X ray checked by Primary MD next visit.    Activity: As tolerated with Full fall precautions use walker/cane & assistance as needed   Disposition Home    Diet: Heart Healthy   For Heart failure patients - Check your Weight same time everyday, if you gain over 2 pounds, or you develop in leg swelling, experience more shortness of breath or chest pain, call your Primary MD immediately. Follow Cardiac Low Salt Diet and 1.5 lit/day fluid restriction.   On your next visit with your primary care physician please Get Medicines reviewed and adjusted.   Please request your Prim.MD to go over all Hospital Tests and Procedure/Radiological results at the follow up, please get all Hospital records sent to your Prim MD by signing hospital release before you go home.   If you experience worsening of your admission symptoms, develop shortness of breath, life threatening emergency, suicidal or homicidal thoughts you must seek medical attention immediately by calling 911 or calling your MD immediately if symptoms less severe.  You Must read complete instructions/literature along with all the possible adverse reactions/side effects for all the Medicines you take and that have been prescribed to you. Take any new Medicines after you have completely understood and accpet all the possible adverse reactions/side effects.   Do not drive, operating heavy machinery, perform activities at heights, swimming or participation in water activities or provide baby sitting services if your were admitted for syncope or siezures until you have seen by Primary MD or a Neurologist and advised to do so again.  Do not drive when taking Pain medications.    Do not take more than prescribed Pain, Sleep and Anxiety Medications  Special  Instructions: If you have smoked or chewed Tobacco in the last 2 yrs please stop smoking, stop any regular Alcohol and or any Recreational drug use.  Wear Seat belts while driving.   Please note  You were cared for by a hospitalist during your hospital stay. If you have any questions about your discharge medications or the care you received while you were in the hospital after you are discharged, you can call the unit and asked to speak with the hospitalist on call if the hospitalist that took care of you is not available. Once you are discharged, your primary care physician will handle any further medical issues. Please note that  NO REFILLS for any discharge medications will be authorized once you are discharged, as it is imperative that you return to your primary care physician (or establish a relationship with a primary care physician if you do not have one) for your aftercare needs so that they can reassess your need for medications and monitor your lab values.     Discharge patient  Complete by: As directed      Increase activity slowly  Complete by: As directed             Discharge Medications      Medication List    STOP taking these medications       amoxicillin-clavulanate 875-125 MG tablet  Commonly known as: AUGMENTIN      TAKE these medications       acetaminophen 325 MG tablet  Commonly known as: TYLENOL  Take 1 tablet (325 mg total) by mouth every 6 (six) hours as needed for mild pain.     ALLEGRA PO  Take 1 tablet by mouth daily as needed (seasonal allergies).     ALPRAZolam 0.25 MG tablet  Commonly known as: XANAX  Take 0.5 tablets (0.125 mg total) by mouth every 8 (eight) hours as needed for anxiety.     amoxicillin 500 MG capsule  Commonly known as: AMOXIL  Take 1 capsule (500 mg total) by mouth every 12 (twelve) hours.     aspirin EC 81 MG tablet  Take 1 tablet (81 mg total) by  mouth daily.  Start taking on: 07/11/2015     colchicine 0.6 MG tablet  Take 0.6 mg by mouth as needed (for gout). Reported on 05/26/2015     ferrous sulfate 325 (65 FE) MG tablet  Take 1 tablet (325 mg total) by mouth 3 (three) times daily with meals.     fludrocortisone 0.1 MG tablet  Commonly known as: FLORINEF  Take 1 tablet (0.1 mg total) by mouth 2 (two) times daily.     fluticasone 50 MCG/ACT nasal spray  Commonly known as: FLONASE  Place 2 sprays into both nostrils at bedtime as needed (seasonal allergies).     guaiFENesin-codeine 100-10 MG/5ML syrup  Take 10 mLs by mouth every 6 (six) hours.     HYDROcodone-acetaminophen 5-325 MG tablet  Commonly known as: NORCO  Take 1-2 tablets by mouth every 6 (six) hours as needed for moderate pain.     hydrocortisone 25 MG suppository  Commonly known as: ANUSOL-HC  Place 25 mg rectally as needed for hemorrhoids or itching.     Melatonin 10 MG Caps  Take 10 mg by mouth at bedtime as needed (sleep).     methylPREDNISolone 4 MG Tbpk tablet  Commonly known as: MEDROL DOSEPAK  follow package directions     midodrine 2.5 MG tablet  Commonly known as: PROAMATINE  Take 1 tablet (2.5 mg total) by mouth 3 (three) times daily with meals.     nitroGLYCERIN 0.4 MG SL tablet  Commonly known as: NITROSTAT  Place 1 tablet (0.4 mg total) under the tongue every 5 (five) minutes as needed. For chest pain     pantoprazole 40 MG tablet  Commonly known as: PROTONIX  Take 1 tablet (40 mg total) by mouth 2 (two) times daily.     potassium chloride 10 MEQ tablet  Commonly known as: K-DUR  TAKE 4 TABLETS (40 MEQ TOTAL) BY MOUTH DAILY.     PRESCRIPTION MEDICATION  Inhale into the lungs at bedtime. CPAP  rivaroxaban 10 MG Tabs tablet  Commonly known as: XARELTO  Take 2 tablets (20 mg total) by mouth daily with supper.  Start taking on: 07/12/2015      rosuvastatin 40 MG tablet  Commonly known as: CRESTOR  Take 1 tablet (40 mg total) by mouth daily.     senna 8.6 MG Tabs tablet  Commonly known as: SENOKOT  Take 1 tablet by mouth 2 (two) times daily.     SENNA S 8.6-50 MG tablet  Generic drug: senna-docusate  Take 1 tablet by mouth 2 (two) times daily.     sertraline 50 MG tablet  Commonly known as: ZOLOFT  Take 1 tablet (50 mg total) by mouth daily.     tiZANidine 4 MG tablet  Commonly known as: ZANAFLEX  Take 4 mg by mouth 2 (two) times daily as needed for muscle spasms.     vancomycin 1,250 mg in sodium chloride 0.9 % 250 mL  Inject 1,250 mg into the vein every 12 (twelve) hours. 10 day supply, HH RN to administer, draw levels 2/week to be followed by [facility].        Major procedures and Radiology Reports - PLEASE review detailed and final reports for all details, in brief -   TTE  Left ventricle: The cavity size was mildly dilated. Wall  thickness was normal. The estimated ejection fraction was 40%.  Akinesis of the anteroseptal wall and the true apex. Features are  consistent with a pseudonormal left ventricular filling pattern,  with concomitant abnormal relaxation and increased filling  pressure (grade 2 diastolic dysfunction). - Aortic valve: There was no stenosis. There was mild  regurgitation. - Aorta: Dilated aortic root. Aortic root dimension: 41 mm (ED). - Mitral valve: Mildly calcified annulus. There was mild  regurgitation. - Left atrium: The atrium was moderately to severely dilated. - Right ventricle: The cavity size was normal. Systolic function  was normal. - Right atrium: The atrium was mildly dilated. - Tricuspid valve: Peak RV-RA gradient (S): 24 mm Hg. - Systemic veins: IVC not visualized..   Mildly dilated LV with EF 40%. Wall motion abnormalities as noted above. Moderate diastolic dysfunction. Normal RV size and  systolic function. Mild MR. Mild  aortic insufficiency.  TTE 04-12-14 - - Left ventricle: The cavity size was normal. There was mild focal basal hypertrophy of the septum. Systolic function was severely reduced. The estimated ejection fraction was in the range of 25% to 30%. There is akinesis of the anteroseptal and apical myocardium. Doppler parameters are consistent with abnormal left ventricular relaxation (grade 1 diastolic dysfunction). - Aortic valve: There was trivial regurgitation. - Ascending aorta: The ascending aorta was mildly dilated.   Imaging Results    Dg Chest 2 View  07/01/2015 CLINICAL DATA: Cough and fever EXAM: CHEST 2 VIEW COMPARISON: 04/07/2014 FINDINGS: Cardiomegaly with negative aortic and hilar contours. Status post CABG. Low volume chest with interstitial crowding. There is no edema, consolidation, effusion, or pneumothorax. Implantable loop recorder. 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 Patient has previous cervical spine CT imaging in 2016. IMPRESSION: Low volume chest without acute finding. Electronically Signed By: Monte Fantasia M.D. On: 07/01/2015 20:46   Ct Soft Tissue Neck W Contrast  07/02/2015 CLINICAL DATA: Initial evaluation for acute left-sided neck swelling with sore throat. EXAM: CT NECK WITH CONTRAST TECHNIQUE: Multidetector CT imaging of the neck was performed using the standard protocol following the bolus administration of intravenous contrast. CONTRAST: 1 ISOVUE-300 IOPAMIDOL (ISOVUE-300) INJECTION 61%  COMPARISON: None. FINDINGS: Visualized portions of the brain demonstrate no acute process. Encephalomalacia noted within the anterior right temporal pole. Partially visualized globes and orbits demonstrate no acute abnormality. Scattered mucosal thickening within the max O sinuses and ethmoidal air cells. Fluid level within the right sphenoid sinus. Mucosal thickening within the left sphenoid sinus. Mastoid air cells are clear. Middle  ear cavities are clear. Salivary glands including the parotid glands and submandibular glands are normal. Oral cavity is unremarkable, although evaluation somewhat limited by streak artifact from dental amalgam. No acute abnormality about the dentition. Palatine tonsils are mildly prominent and hyper enhancing, which may reflect acute tonsillitis. No peritonsillar abscess or other collection. Epiglottis normal. Vallecula clear. No retropharyngeal fluid collection or edema. Remainder of the hypopharynx and supraglottic larynx within normal limits without acute inflammatory changes. True cords grossly normal. Subglottic airway clear. Approximately 2 cm hypodense nodule with internal calcification present within the inferior left thyroid lobe, indeterminate. Thyroid otherwise within normal limits. Enlarged left level 2 lymph nodes measure up to 13 mm. A few of these nodes are somewhat heterogeneous and partially hypodense in appearance. Increased number of left level 2 P and level 5 nodes as compared to the right. These measure up to 15 mm. Increased number of subcentimeter left level 2/3 nodes as well. There is associated inflammatory stranding within the adjacent fat within the is area of adenopathy in the left neck. Finding is indeterminate, and could reflect supper node adenitis or reactive adenopathy. Nodal malignancy could also have this appearance. No significant adenopathy seen within the right neck. Visualized superior mediastinum within normal limits. Visualized lungs are clear. Normal intravascular enhancement seen throughout the neck. No acute osseous abnormality. No worrisome lytic or blastic osseous lesions. Prominent bridging anterior osteophytes seen throughout the cervical spine. IMPRESSION: 1. Left-sided cervical adenopathy with associated inflammatory stranding as detailed above, indeterminate, but may reflect sequela of suppurative adenitis. Alternatively, these nodes may be reactive in nature.  Nodal malignancy could also have this appearance. Clinical followup to resolution is recommended. 2. Question mild enlargement and hyper enhancement of the palatine tonsils, which may reflect acute tonsillitis. Correlation with physical exam and direct visualization recommended. 3. Approximately 2 cm left thyroid nodule, indeterminate. Correlation with dedicated thyroid ultrasound recommended. This could be performed on a nonemergent basis. 4. Prominent anterior bridging osteophytic spurring throughout the cervical spine. 5. Acute on chronic paranasal sinus disease as above. Electronically Signed By: Jeannine Boga M.D. On: 07/02/2015 00:13   Ct Abdomen Pelvis W Contrast  07/03/2015 CLINICAL DATA: Suprapubic abdominal pain since this morning. Nausea. Fever. Cough. EXAM: CT ABDOMEN AND PELVIS WITH CONTRAST TECHNIQUE: Multidetector CT imaging of the abdomen and pelvis was performed using the standard protocol following bolus administration of intravenous contrast. CONTRAST: 182mL ISOVUE-300 IOPAMIDOL (ISOVUE-300) INJECTION 61% COMPARISON: 12/23/2014 FINDINGS: Motion artifact limits evaluation of lung bases. Postoperative changes in the mediastinum. Mild diffuse fatty infiltration of the liver. Scattered calcified granulomas in the liver and spleen. Fatty infiltration of the pancreas. Small cysts in the kidneys. No hydronephrosis. Nephrograms are symmetrical. Calcification of aorta without aneurysm. No adrenal gland nodules. Normal caliber IVC. No retroperitoneal lymphadenopathy. Stomach, small bowel, and colon are not abnormally distended. No free air or free fluid in the abdomen. Pelvis: Appendix is normal. Prostate gland is enlarged, measuring 5.8 cm diameter. The mid and inferior portions of the rectus abdominus muscles bilaterally are expanded with infiltration in the fat around the rectus abdominus muscles and in the anterior low pelvis. This suggest rectus  muscle hematomas, possibly due to  muscle injury or tear. There is pooling of contrast material within the rectus abdominus muscles, more on the left. This may indicate active contrast extravasation. Bladder wall is not thickened. Postoperative changes in the right hip with intra medullary rod and compression bolt fixation. There is lucency around the visualized compression bolt and rod. This could indicate loosening of hardware. Degenerative changes in the spine and hips. Bilateral inguinal hernias containing fat. IMPRESSION: Bilateral inferior rectus abdominus muscle hematomas with soft tissue hematoma in the surrounding fat and anterior pelvic wall. Puddling of contrast material in the inferior rectus muscles bilaterally but greater on the left may indicate active extravasation. Additional incidental findings as discussed in the body of report. These results will be called to the ordering clinician or representative by the Radiologist Assistant, and communication documented in the PACS or zVision Dashboard. Electronically Signed By: Lucienne Capers M.D. On: 07/03/2015 01:18     Micro Results     Recent Results (from the past 240 hour(s))  Culture, group A strep Status: None   Collection Time: 07/01/15 7:31 PM  Result Value Ref Range Status   Specimen Description THROAT  Final   Special Requests NONE  Final   Culture NO GROUP A STREP (S.PYOGENES) ISOLATED  Final   Report Status 07/04/2015 FINAL  Final  Blood culture (routine x 2) Status: Abnormal   Collection Time: 07/01/15 8:07 PM  Result Value Ref Range Status   Specimen Description BLOOD RIGHT ANTECUBITAL  Final   Special Requests BOTTLES DRAWN AEROBIC AND ANAEROBIC 5CC  Final   Culture Setup Time   Final    GRAM POSITIVE COCCI IN CLUSTERS CRITICAL RESULT CALLED TO, READ BACK BY AND VERIFIED WITH: DR. Candiss Norse AT [id number] ON [id number] BY S. YARBROUGH IN BOTH AEROBIC AND ANAEROBIC BOTTLES    Culture (A)   Final    STAPHYLOCOCCUS SPECIES (COAGULASE NEGATIVE) SUSCEPTIBILITIES PERFORMED ON PREVIOUS CULTURE WITHIN THE LAST 5 DAYS.    Report Status 07/06/2015 FINAL  Final  Urine culture Status: None   Collection Time: 07/01/15 8:10 PM  Result Value Ref Range Status   Specimen Description URINE, CLEAN CATCH  Final   Special Requests NONE  Final   Culture MULTIPLE SPECIES PRESENT, SUGGEST RECOLLECTION  Final   Report Status 07/03/2015 FINAL  Final  Blood culture (routine x 2) Status: None   Collection Time: 07/01/15 9:23 PM  Result Value Ref Range Status   Specimen Description BLOOD RIGHT FOREARM  Final   Special Requests BOTTLES DRAWN AEROBIC AND ANAEROBIC 5CC  Final   Culture NO GROWTH 5 DAYS  Final   Report Status 07/06/2015 FINAL  Final  Culture, blood (Routine X 2) w Reflex to ID Panel Status: Abnormal   Collection Time: 07/03/15 12:01 AM  Result Value Ref Range Status   Specimen Description BLOOD RIGHT ANTECUBITAL  Final   Special Requests BOTTLES DRAWN AEROBIC AND ANAEROBIC 5CC  Final   Culture Setup Time   Final    GRAM POSITIVE COCCI ANAEROBIC BOTTLE ONLY CRITICAL RESULT CALLED TO, READ BACK BY AND VERIFIED WITH: DR [facility] 07/05/15 @ 35 M VESTAL    Culture STAPHYLOCOCCUS SPECIES (COAGULASE NEGATIVE) (A)  Final   Report Status 07/06/2015 FINAL  Final   Organism ID, Bacteria STAPHYLOCOCCUS SPECIES (COAGULASE NEGATIVE)  Final   Susceptibility   Staphylococcus species (coagulase negative) - MIC*    CIPROFLOXACIN <=0.5 SENSITIVE Sensitive     ERYTHROMYCIN >=8 RESISTANT Resistant     GENTAMICIN <=0.5  SENSITIVE Sensitive     OXACILLIN >=4 RESISTANT Resistant     TETRACYCLINE 2 SENSITIVE Sensitive     VANCOMYCIN 1 SENSITIVE Sensitive     TRIMETH/SULFA <=10 SENSITIVE Sensitive     CLINDAMYCIN <=0.25 SENSITIVE  Sensitive     RIFAMPIN <=0.5 SENSITIVE Sensitive     Inducible Clindamycin NEGATIVE Sensitive    * STAPHYLOCOCCUS SPECIES (COAGULASE NEGATIVE)  Blood culture (routine x 2) Status: None   Collection Time: 07/03/15 1:27 AM  Result Value Ref Range Status   Specimen Description BLOOD RIGHT HAND  Final   Special Requests IN PEDIATRIC BOTTLE 4CC  Final   Culture NO GROWTH 5 DAYS  Final   Report Status 07/08/2015 FINAL  Final  Culture, blood (routine x 2) Status: None (Preliminary result)   Collection Time: 07/05/15 11:38 AM  Result Value Ref Range Status   Specimen Description BLOOD LEFT HAND  Final   Special Requests BOTTLES DRAWN AEROBIC ONLY 10CC  Final   Culture NO GROWTH 3 DAYS  Final   Report Status PENDING  Incomplete  Culture, blood (routine x 2) Status: None (Preliminary result)   Collection Time: 07/05/15 11:44 AM  Result Value Ref Range Status   Specimen Description BLOOD RIGHT HAND  Final   Special Requests IN PEDIATRIC BOTTLE 2CC  Final   Culture NO GROWTH 3 DAYS  Final   Report Status PENDING  Incomplete       Today   Subjective    Jaimin Merkley today has no headache,no chest abdominal pain,no new weakness tingling or numbness, feels much better wants to go home today.   Objective   Blood pressure 120/60, pulse 71, temperature 98.1 F (36.7 C), temperature source Oral, resp. rate 18, height 6' (1.829 m), weight 97.16 kg (214 lb 3.2 oz), SpO2 95 %.   Intake/Output Summary (Last 24 hours) at 07/09/15 1045 Last data filed at 07/09/15 0953  Gross per 24 hour  Intake  100 ml  Output  1000 ml  Net  -900 ml          The above d/w pt.   To recap:  blood culture pos, with f/u neg, with plan for amoxil and IV vanc in the meantime.  Has R arm PICC, getting vanc at home w/o ADE.  Compliant with med.  Throat sx much improved, he wanted to  defer ENT eval at this point.  Swallowing back to baseline, no fevers now.  He feels weak in general, with baseline L sided weakness unchanged, but clearly better from status at his nadir.    He needs f/u mag and K, with other recent labs done at home, with vanc level okay and CBC improved.   Hematoma in rectus.  Much less pain now, minimal sx.  Minimal bruise now.  No pain med requirement.  On ASA but off xarelto in meantime.  He has f/u with cards pending.   PMH and SH reviewed  ROS: See HPI, otherwise noncontributory.  Meds, vitals, and allergies reviewed.   GEN: nad, alert and oriented HEENT: mucous membranes moist NECK: supple w/o LA, not ttp CV: rrr. PULM: ctab, no inc wob ABD: soft, +bs, not ttp, minimal/faint lower bruising EXT: trace BLE edema SKIN: no acute rash L side flaccid at baseline.

## 2015-07-12 NOTE — Patient Instructions (Signed)
We'll call [facility] about your labs in the meantime.  Don't restart the xarelto until you hear from [place] or the cardiac clinic.  Take care.  Glad to see you.

## 2015-07-13 ENCOUNTER — Encounter: Payer: Self-pay | Admitting: Family Medicine

## 2015-07-13 NOTE — Assessment & Plan Note (Signed)
Resolved, defer ENT f/u for now per patient preference.  This is reasonable.

## 2015-07-13 NOTE — Assessment & Plan Note (Signed)
Continue abx for now, I can give the order to remove the PICC when done with vanc.  R arm PICC site CDI.

## 2015-07-13 NOTE — Assessment & Plan Note (Signed)
Recheck labs pending.  

## 2015-07-13 NOTE — Assessment & Plan Note (Signed)
Much improved, I'll ask cards for input on when to restart xarelto.  Off med currently, but on ASA.

## 2015-07-13 NOTE — Telephone Encounter (Signed)
Hgb improved to 9.1.  Still on asiprin, not on xarelto yet.  No sign of bleeding on exam at office visit on 07/12/15.  Please give specific date for restart xarelto, and if you want him on 10 vs 20mg  per day.  Many thanks.

## 2015-07-14 ENCOUNTER — Telehealth: Payer: Self-pay | Admitting: Cardiovascular Disease

## 2015-07-14 ENCOUNTER — Telehealth: Payer: Self-pay | Admitting: Family Medicine

## 2015-07-14 ENCOUNTER — Telehealth: Payer: Self-pay | Admitting: Cardiology

## 2015-07-14 ENCOUNTER — Other Ambulatory Visit: Payer: Self-pay | Admitting: Family Medicine

## 2015-07-14 MED ORDER — POTASSIUM CHLORIDE ER 10 MEQ PO TBCR: 20.0000 meq | EXTENDED_RELEASE_TABLET | Freq: Three times a day (TID) | ORAL | Status: DC

## 2015-07-14 NOTE — Telephone Encounter (Signed)
See note from 4/25. Wife calling trying to clarify pt's Xarelto prescription. Pt had been hosp for GI bleed. Instructions to restart Xarelto by PCP (neg for S&S bleeding at hosp f/u w PCP)  He was prescribed 10mg  daily for 1 week to restart before taking higher dose. Wife wants to know if he should take 10mg  daily or 20mg  daily due to how Rx was written, Message was sent to Dr. Claiborne Billings for clarification on Xarelto dose, pt has not heard back yet. Wife would like to know something today.  She is aware I will send to pharmD for review and discuss.

## 2015-07-14 NOTE — Telephone Encounter (Signed)
Magnesium wnl, K low, needs 15mEq tid, recheck cbc and bmet in 5 days.  Awaiting cards input on xarelto restart.  Thanks.

## 2015-07-14 NOTE — Telephone Encounter (Signed)
Pt's wife was notified of results. Will update pt on xarelto asap.

## 2015-07-14 NOTE — Telephone Encounter (Signed)
Pt's wife called in wanting to speak with a nurse about the pt's Xarelto medication. She says that he has been off this medication for 2 wks and she would like to know when he should start back taking this and should the dosage be the same. Please f/u with her as soon as possible.   Thanks

## 2015-07-14 NOTE — Telephone Encounter (Signed)
Spoke w/ pt wife and requested that pt send a manual transmission b/c pt home monitor has not updated in at least 14 days.   

## 2015-07-14 NOTE — Telephone Encounter (Signed)
Pt wife called and said [facility] came out to home yesterday 4/27 and took pt's blood. The results should be in today and to keep a look out for them and let pt wife know about how to continue Xarelto. Please advise

## 2015-07-14 NOTE — Telephone Encounter (Signed)
Called pt wife and instructed her to have pt take 20mg  daily until we are able to discuss with Dr. Claiborne Billings. Given his extensive history of stroke and PE would feel more comfortable with him taking the full dose as he has been off of medication for 2 weeks and does not have any additional bleeding issues.

## 2015-07-15 ENCOUNTER — Observation Stay (HOSPITAL_COMMUNITY)
Admission: EM | Admit: 2015-07-15 | Discharge: 2015-07-18 | Disposition: A | Discharge status: Home / Self Care | Payer: Medicare Other | Attending: Internal Medicine | Admitting: Internal Medicine

## 2015-07-15 ENCOUNTER — Emergency Department (HOSPITAL_COMMUNITY): Payer: Medicare Other

## 2015-07-15 ENCOUNTER — Encounter (HOSPITAL_COMMUNITY): Payer: Self-pay

## 2015-07-15 DIAGNOSIS — R0789 Other chest pain: Principal | ICD-10-CM | POA: Insufficient documentation

## 2015-07-15 DIAGNOSIS — M109 Gout, unspecified: Secondary | ICD-10-CM | POA: Insufficient documentation

## 2015-07-15 DIAGNOSIS — Z8701 Personal history of pneumonia (recurrent): Secondary | ICD-10-CM | POA: Insufficient documentation

## 2015-07-15 DIAGNOSIS — Z8673 Personal history of transient ischemic attack (TIA), and cerebral infarction without residual deficits: Secondary | ICD-10-CM | POA: Diagnosis not present

## 2015-07-15 DIAGNOSIS — G8192 Hemiplegia, unspecified affecting left dominant side: Secondary | ICD-10-CM | POA: Diagnosis present

## 2015-07-15 DIAGNOSIS — J039 Acute tonsillitis, unspecified: Secondary | ICD-10-CM

## 2015-07-15 DIAGNOSIS — Z86711 Personal history of pulmonary embolism: Secondary | ICD-10-CM | POA: Insufficient documentation

## 2015-07-15 DIAGNOSIS — I63411 Cerebral infarction due to embolism of right middle cerebral artery: Secondary | ICD-10-CM | POA: Diagnosis present

## 2015-07-15 DIAGNOSIS — Z86010 Personal history of colonic polyps: Secondary | ICD-10-CM | POA: Insufficient documentation

## 2015-07-15 DIAGNOSIS — Z87891 Personal history of nicotine dependence: Secondary | ICD-10-CM | POA: Insufficient documentation

## 2015-07-15 DIAGNOSIS — F329 Major depressive disorder, single episode, unspecified: Secondary | ICD-10-CM | POA: Insufficient documentation

## 2015-07-15 DIAGNOSIS — Z7901 Long term (current) use of anticoagulants: Secondary | ICD-10-CM

## 2015-07-15 DIAGNOSIS — A499 Bacterial infection, unspecified: Secondary | ICD-10-CM | POA: Diagnosis not present

## 2015-07-15 DIAGNOSIS — G4733 Obstructive sleep apnea (adult) (pediatric): Secondary | ICD-10-CM | POA: Insufficient documentation

## 2015-07-15 DIAGNOSIS — Z7952 Long term (current) use of systemic steroids: Secondary | ICD-10-CM | POA: Diagnosis not present

## 2015-07-15 DIAGNOSIS — R011 Cardiac murmur, unspecified: Secondary | ICD-10-CM | POA: Diagnosis not present

## 2015-07-15 DIAGNOSIS — Z951 Presence of aortocoronary bypass graft: Secondary | ICD-10-CM | POA: Diagnosis present

## 2015-07-15 DIAGNOSIS — I5042 Chronic combined systolic (congestive) and diastolic (congestive) heart failure: Secondary | ICD-10-CM | POA: Diagnosis present

## 2015-07-15 DIAGNOSIS — I252 Old myocardial infarction: Secondary | ICD-10-CM | POA: Diagnosis not present

## 2015-07-15 DIAGNOSIS — G8194 Hemiplegia, unspecified affecting left nondominant side: Secondary | ICD-10-CM | POA: Insufficient documentation

## 2015-07-15 DIAGNOSIS — F419 Anxiety disorder, unspecified: Secondary | ICD-10-CM | POA: Insufficient documentation

## 2015-07-15 DIAGNOSIS — E876 Hypokalemia: Secondary | ICD-10-CM | POA: Diagnosis present

## 2015-07-15 DIAGNOSIS — I10 Essential (primary) hypertension: Secondary | ICD-10-CM | POA: Insufficient documentation

## 2015-07-15 DIAGNOSIS — E2740 Unspecified adrenocortical insufficiency: Secondary | ICD-10-CM | POA: Insufficient documentation

## 2015-07-15 DIAGNOSIS — R7881 Bacteremia: Secondary | ICD-10-CM | POA: Diagnosis present

## 2015-07-15 DIAGNOSIS — S301XXA Contusion of abdominal wall, initial encounter: Secondary | ICD-10-CM

## 2015-07-15 DIAGNOSIS — K648 Other hemorrhoids: Secondary | ICD-10-CM | POA: Insufficient documentation

## 2015-07-15 DIAGNOSIS — E669 Obesity, unspecified: Secondary | ICD-10-CM | POA: Insufficient documentation

## 2015-07-15 DIAGNOSIS — E785 Hyperlipidemia, unspecified: Secondary | ICD-10-CM | POA: Insufficient documentation

## 2015-07-15 DIAGNOSIS — Z7982 Long term (current) use of aspirin: Secondary | ICD-10-CM | POA: Diagnosis not present

## 2015-07-15 DIAGNOSIS — K219 Gastro-esophageal reflux disease without esophagitis: Secondary | ICD-10-CM | POA: Diagnosis not present

## 2015-07-15 DIAGNOSIS — K5790 Diverticulosis of intestine, part unspecified, without perforation or abscess without bleeding: Secondary | ICD-10-CM | POA: Insufficient documentation

## 2015-07-15 DIAGNOSIS — R079 Chest pain, unspecified: Secondary | ICD-10-CM | POA: Diagnosis present

## 2015-07-15 DIAGNOSIS — I2510 Atherosclerotic heart disease of native coronary artery without angina pectoris: Secondary | ICD-10-CM | POA: Insufficient documentation

## 2015-07-15 LAB — CBC
HCT: 29.8 % — ABNORMAL LOW (ref 39.0–52.0)
Hemoglobin: 9.1 g/dL — ABNORMAL LOW (ref 13.0–17.0)
MCH: 25.3 pg — ABNORMAL LOW (ref 26.0–34.0)
MCHC: 30.5 g/dL (ref 30.0–36.0)
MCV: 83 fL (ref 78.0–100.0)
Platelets: 227 10*3/uL (ref 150–400)
RBC: 3.59 MIL/uL — ABNORMAL LOW (ref 4.22–5.81)
RDW: 16.5 % — ABNORMAL HIGH (ref 11.5–15.5)
WBC: 9.3 10*3/uL (ref 4.0–10.5)

## 2015-07-15 LAB — BASIC METABOLIC PANEL
Anion gap: 8 (ref 5–15)
BUN: 7 mg/dL (ref 6–20)
CO2: 23 mmol/L (ref 22–32)
Calcium: 8 mg/dL — ABNORMAL LOW (ref 8.9–10.3)
Chloride: 110 mmol/L (ref 101–111)
Creatinine, Ser: 0.69 mg/dL (ref 0.61–1.24)
GFR calc Af Amer: >60 mL/min (ref >60)
GFR calc non Af Amer: >60 mL/min (ref >60)
Glucose, Bld: 106 mg/dL — ABNORMAL HIGH (ref 65–99)
Potassium: 3 mmol/L — ABNORMAL LOW (ref 3.5–5.1)
Sodium: 141 mmol/L (ref 135–145)

## 2015-07-15 LAB — PROTIME-INR
INR: 2.55 — ABNORMAL HIGH (ref 0.00–1.49)
Prothrombin Time: 27.1 seconds — ABNORMAL HIGH (ref 11.6–15.2)

## 2015-07-15 LAB — TROPONIN I
Troponin I: 0.03 ng/mL (ref <0.031)
Troponin I: 0.03 ng/mL (ref <0.031)

## 2015-07-15 LAB — I-STAT TROPONIN, ED
Troponin i, poc: 0.01 ng/mL (ref 0.00–0.08)
Troponin i, poc: 0.02 ng/mL (ref 0.00–0.08)

## 2015-07-15 MED ORDER — AMOXICILLIN 500 MG PO CAPS
500.0000 mg | ORAL_CAPSULE | Freq: Two times a day (BID) | ORAL | Status: DC
  Administered 2015-07-15 – 2015-07-17 (×6): 500 mg via ORAL
  Filled 2015-07-15 (×7): qty 1

## 2015-07-15 MED ORDER — LORATADINE 10 MG PO TABS
10.0000 mg | ORAL_TABLET | Freq: Every day | ORAL | Status: DC
  Administered 2015-07-15 – 2015-07-18 (×4): 10 mg via ORAL
  Filled 2015-07-15 (×4): qty 1

## 2015-07-15 MED ORDER — SENNOSIDES-DOCUSATE SODIUM 8.6-50 MG PO TABS
1.0000 | ORAL_TABLET | Freq: Two times a day (BID) | ORAL | Status: DC
  Administered 2015-07-15 – 2015-07-18 (×4): 1 via ORAL
  Filled 2015-07-15 (×5): qty 1

## 2015-07-15 MED ORDER — COLCHICINE 0.6 MG PO TABS: 0.6000 mg | ORAL_TABLET | Freq: Every day | ORAL | Status: DC | PRN

## 2015-07-15 MED ORDER — ROSUVASTATIN CALCIUM 40 MG PO TABS
40.0000 mg | ORAL_TABLET | Freq: Every day | ORAL | Status: DC
  Administered 2015-07-15 – 2015-07-17 (×3): 40 mg via ORAL
  Filled 2015-07-15: qty 1
  Filled 2015-07-15: qty 4
  Filled 2015-07-15 (×2): qty 1
  Filled 2015-07-15 (×2): qty 4

## 2015-07-15 MED ORDER — ALPRAZOLAM 0.25 MG PO TABS: 0.1250 mg | ORAL_TABLET | Freq: Three times a day (TID) | ORAL | Status: DC | PRN

## 2015-07-15 MED ORDER — POTASSIUM CHLORIDE CRYS ER 10 MEQ PO TBCR
20.0000 meq | EXTENDED_RELEASE_TABLET | Freq: Three times a day (TID) | ORAL | Status: DC
  Administered 2015-07-15 – 2015-07-18 (×10): 20 meq via ORAL
  Filled 2015-07-15 (×11): qty 2

## 2015-07-15 MED ORDER — NITROGLYCERIN 0.4 MG SL SUBL
0.4000 mg | SUBLINGUAL_TABLET | SUBLINGUAL | Status: DC | PRN
  Administered 2015-07-15: 0.4 mg via SUBLINGUAL
  Filled 2015-07-15: qty 1

## 2015-07-15 MED ORDER — ACETAMINOPHEN 325 MG PO TABS: 325.0000 mg | ORAL_TABLET | Freq: Four times a day (QID) | ORAL | Status: DC | PRN

## 2015-07-15 MED ORDER — PANTOPRAZOLE SODIUM 40 MG PO TBEC
40.0000 mg | DELAYED_RELEASE_TABLET | Freq: Two times a day (BID) | ORAL | Status: DC
  Administered 2015-07-15 – 2015-07-18 (×7): 40 mg via ORAL
  Filled 2015-07-15 (×7): qty 1

## 2015-07-15 MED ORDER — FLUTICASONE PROPIONATE 50 MCG/ACT NA SUSP
2.0000 | Freq: Every evening | NASAL | Status: DC | PRN
  Filled 2015-07-15: qty 16

## 2015-07-15 MED ORDER — RIVAROXABAN 20 MG PO TABS
20.0000 mg | ORAL_TABLET | Freq: Every day | ORAL | Status: DC
  Administered 2015-07-15 – 2015-07-16 (×2): 20 mg via ORAL
  Filled 2015-07-15 (×2): qty 1

## 2015-07-15 MED ORDER — SENNA 8.6 MG PO TABS
1.0000 | ORAL_TABLET | Freq: Two times a day (BID) | ORAL | Status: DC
  Administered 2015-07-15 – 2015-07-18 (×4): 8.6 mg via ORAL
  Filled 2015-07-15 (×5): qty 1

## 2015-07-15 MED ORDER — SERTRALINE HCL 50 MG PO TABS
50.0000 mg | ORAL_TABLET | Freq: Every day | ORAL | Status: DC
  Administered 2015-07-15 – 2015-07-18 (×4): 50 mg via ORAL
  Filled 2015-07-15 (×4): qty 1

## 2015-07-15 MED ORDER — ASPIRIN EC 81 MG PO TBEC
81.0000 mg | DELAYED_RELEASE_TABLET | Freq: Every day | ORAL | Status: DC
  Administered 2015-07-15 – 2015-07-18 (×4): 81 mg via ORAL
  Filled 2015-07-15 (×4): qty 1

## 2015-07-15 MED ORDER — ASPIRIN 325 MG PO TABS
325.0000 mg | ORAL_TABLET | Freq: Every day | ORAL | Status: DC
  Administered 2015-07-15: 325 mg via ORAL
  Filled 2015-07-15: qty 1

## 2015-07-15 MED ORDER — MELATONIN 3 MG PO TABS
9.0000 mg | ORAL_TABLET | Freq: Every evening | ORAL | Status: DC | PRN
  Filled 2015-07-15: qty 3

## 2015-07-15 MED ORDER — MIDODRINE HCL 5 MG PO TABS
2.5000 mg | ORAL_TABLET | Freq: Three times a day (TID) | ORAL | Status: DC
  Administered 2015-07-15 – 2015-07-18 (×9): 2.5 mg via ORAL
  Filled 2015-07-15 (×9): qty 1

## 2015-07-15 MED ORDER — POTASSIUM CHLORIDE CRYS ER 20 MEQ PO TBCR
40.0000 meq | EXTENDED_RELEASE_TABLET | ORAL | Status: AC
  Administered 2015-07-15: 40 meq via ORAL
  Filled 2015-07-15: qty 2

## 2015-07-15 MED ORDER — ONDANSETRON HCL 4 MG/2ML IJ SOLN: 4.0000 mg | Freq: Four times a day (QID) | INTRAMUSCULAR | Status: DC | PRN

## 2015-07-15 MED ORDER — HYDROCORTISONE ACETATE 25 MG RE SUPP: 25.0000 mg | RECTAL | Status: DC | PRN

## 2015-07-15 MED ORDER — FERROUS SULFATE 325 (65 FE) MG PO TABS
325.0000 mg | ORAL_TABLET | Freq: Three times a day (TID) | ORAL | Status: DC
  Administered 2015-07-15 – 2015-07-18 (×9): 325 mg via ORAL
  Filled 2015-07-15 (×9): qty 1

## 2015-07-15 MED ORDER — SODIUM CHLORIDE 0.9 % IV SOLN
1250.0000 mg | Freq: Two times a day (BID) | INTRAVENOUS | Status: AC
  Administered 2015-07-15 – 2015-07-16 (×4): 1250 mg via INTRAVENOUS
  Filled 2015-07-15 (×4): qty 1250

## 2015-07-15 MED ORDER — TIZANIDINE HCL 4 MG PO TABS
4.0000 mg | ORAL_TABLET | Freq: Two times a day (BID) | ORAL | Status: DC | PRN
  Filled 2015-07-15: qty 1

## 2015-07-15 MED ORDER — FLUDROCORTISONE ACETATE 0.1 MG PO TABS
0.1000 mg | ORAL_TABLET | Freq: Two times a day (BID) | ORAL | Status: DC
  Administered 2015-07-15 – 2015-07-18 (×7): 0.1 mg via ORAL
  Filled 2015-07-15 (×7): qty 1

## 2015-07-15 NOTE — Progress Notes (Signed)
Patient received from ED via stretcher. Patient alert, oriented, and verbal. Vital signs stable. Patient denies any pain or discomfort. Patient hooked up to monitor and oriented to room and unit. Will cont to monitor.

## 2015-07-15 NOTE — ED Notes (Signed)
Patient transported to X-ray 

## 2015-07-15 NOTE — ED Notes (Signed)
Dr. Smith at bedside.

## 2015-07-15 NOTE — ED Notes (Signed)
Pt here with c/o central, squeezing chest pain onset at 11pm that radiates to right side of jaw associated with SOB. Pt appears pale but skin warm and dry. Hx of MI with open heart surgery and stent placement in 2005. He reports the pain feels different than the CP he had with previous MI.

## 2015-07-15 NOTE — ED Notes (Signed)
Pt c/o headache since nitro, offered pain medication for headache, pt declined. Offered additional nitro for chest pain, pt declined

## 2015-07-15 NOTE — H&P (Addendum)
History and Physical    Gregory Wiley [id number] DOB: 1950-12-06 DOA: 07/15/2015  Referring MD/NP/PA: Dr. Kathrynn Humble PCP: [name], MD   Outpatient Specialists: -- Patient coming from:  Home   Chief Complaint:  Chest pain  HPI: Gregory Wiley is a 65 y.o. male with medical history significant of CAD, HTN, HLD, chronic adrenal insufficiency, PE on Xarelto, CVA with residual left sided hemiparesis, orthostatic hypotension, OSA on CPAP; who presents with complaints of  chest pain. Symptoms started acutely around 11 PM tonight while the patient was lying in bed. Described as a squeezing type pain with radiation into the right side of his jaw. Associated symptoms included clamminess and some shortness of breath. He tried using his CPAP machine to see if the oxygen would help him get some relief, but symptoms persisted. His wife then gave him a nitroglycerin which he reports to ease off symptoms. Thereafter, EMS was called and en route he was given aspirin and second nitroglycerin which he reports making chest pain symptoms go away, however it gave him a headache. Of note patient was just recently discharged from the hospital one week ago diagnosed with tonsillitis, rectal sheath hematoma, and bacteremia. Patient notes that he's still taking antibiotics.  ED Course: Admission to the emergency department patient was evaluated and seen to be afebrile, heart rate wnl,  blood pressure stable,respiratory rate to 27, O2 sats patient's maintain on room air. Lab work revealed normal troponins 2, WBC 9.3, hemoglobin 9.1, platelets 227, potassium 3, and all other labwork relatively within normal limits. , Review of Systems: As per HPI otherwise 10 point review of systems negative.    Past Medical History  Diagnosis Date  . Coronary artery disease     stent, then CABG 07/20/10  . Hypertension   . Hyperlipidemia   . Obesity (BMI 30-39.9)   . H/O cardiomyopathy     ischemic, now last echo 07/30/11, EF  38%W  . Diverticulosis   . Internal hemorrhoids   . Tubular adenoma of colon   . Plantar fasciitis of left foot   . Basal cell carcinoma of skin of left ankle   . NSTEMI (non-ST elevated myocardial infarction) ([place])     NSTEMI- last cath 06/2011-Stent to LCX-DES, last nuc 07/30/11 low risk  . MI (myocardial infarction) ([place])     "I had 4 before I retired in 2006" (11/11/2014)  . Pulmonary embolism ([place]) 03/2014  . GERD (gastroesophageal reflux disease)   . Bleeding stomach ulcer 2015  . Daily headache     "for the past month" (11/11/2014)  . Stroke ([place]) 01/2014    "no control of LUE, can slightly LLE; memory problems since" (11/11/2014)  . Arthritis     "back" (11/11/2014)  . Gout   . Depression   . Pneumonia     hosp. 12/2014  . Anxiety   . History of hiatal hernia   . 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 OSA on CPAP     since July 2013- uses CPAP sometimes , states he has lost 147 lbs. since stroke & doesn't use the CPAP as much as he use to.   . Adrenal insufficiency ([place])   . Left hemiplegia [facility])     Past Surgical History  Procedure Laterality Date  . Coronary artery bypass graft  07/20/2010     LIMA-LAD; VG-ACUTE MARG of RCA; Seq VG-distal RCA & then pda  . Coronary angioplasty with stent  placement  07/01/2011    DES-Resolute to native LCX  . Coronary angioplasty with stent placement  12/01/2007    COMPLEX 5 LESION PCI INCLUDING CUTTING BALLOON AND 4 CYPHER DESs  . Knee arthroscopy Right   . Hand surgery Right     to take glass out  . Colonoscopy N/A 02/10/2013    Procedure: COLONOSCOPY;  Surgeon: Ladene Artist, MD;  Location: [facility] ENDOSCOPY;  Service: Endoscopy;  Laterality: N/A;  . Retinal detachment surgery Right   . Loop recorder implant  02/15/2014    MDT LINQ implanted by Dr Rayann Heman for cryptogenic stroke  . Tee without cardioversion N/A 02/15/2014    Procedure: TRANSESOPHAGEAL ECHOCARDIOGRAM (TEE);  Surgeon: Josue Hector, MD;  Location:  [facility] ENDOSCOPY;  Service: Cardiovascular;  Laterality: N/A;  . Left heart catheterization with coronary/graft angiogram  07/01/2011    Procedure: LEFT HEART CATHETERIZATION WITH Beatrix Fetters;  Surgeon: Sanda Klein, MD;  Location: [facility] CATH LAB;  Service: Cardiovascular;;  . Percutaneous coronary stent intervention (pci-s) Right 07/01/2011    Procedure: PERCUTANEOUS CORONARY STENT INTERVENTION (PCI-S);  Surgeon: Sanda Klein, MD;  Location: [facility] CATH LAB;  Service: Cardiovascular;  Laterality: Right;  . Umbilical hernia repair  2006  . Hernia repair    . Basal cell carcinoma excision Left 2015    ankle  . Esophagogastroduodenoscopy  01/2014    gastric ulcer, erosive gastroduodenitis, H Pylori negative.   . Esophagogastroduodenoscopy (egd) with propofol N/A 11/14/2014    Procedure: ESOPHAGOGASTRODUODENOSCOPY (EGD) WITH PROPOFOL;  Surgeon: Milus Banister, MD;  Location: [place];  Service: Endoscopy;  Laterality: N/A;  . Intramedullary (im) nail intertrochanteric Right 02/07/2015    Procedure: INTRAMEDULLARY (IM) NAIL INTERTROCHANTRIC RIGHT HIP;  Surgeon: Renette Butters, MD;  Location: [place];  Service: Orthopedics;  Laterality: Right;  . Cardiac surgery       reports that he quit smoking about 46 years ago. His smoking use included Cigarettes. He has a 8 pack-year smoking history. He has never used smokeless tobacco. He reports that he uses illicit drugs (Marijuana). He reports that he does not drink alcohol.  Allergies  Allergen Reactions  . Diphenhydramine Hcl Anaphylaxis  . Adhesive [Tape] Other (See Comments)    Tears skin - please use paper tape    Family History  Problem Relation Age of Onset  . Diabetes type II Mother   . Coronary artery disease    . Colon cancer Neg Hx   . CAD Father 73    CABG, PCI, died at 90  . Heart attack Father 56  . Hypertension Brother   . Diabetes Brother   . Hyperlipidemia Brother   . Brain cancer Maternal Grandmother   . COPD  Paternal Grandfather   . Other      denies family history of DVT/PE  . Stroke Neg Hx   . Other Neg Hx     adrenal dz    Prior to Admission medications   Medication Sig Start Date End Date Taking? Authorizing Provider  acetaminophen (TYLENOL) 325 MG tablet Take 1 tablet (325 mg total) by mouth every 6 (six) hours as needed for mild pain. 12/27/14  Yes Florencia Reasons, MD  ALPRAZolam Duanne Moron) 0.25 MG tablet Take 0.5 tablets (0.125 mg total) by mouth every 8 (eight) hours as needed for anxiety. 04/14/14  Yes Tiffany L Reed, DO  amoxicillin (AMOXIL) 500 MG capsule Take 1 capsule (500 mg total) by mouth every 12 (twelve) hours. 07/08/15  Yes Thurnell Lose, MD  aspirin EC 81 MG tablet Take 1 tablet (81 mg total) by mouth daily. 07/11/15  Yes Thurnell Lose, MD  colchicine 0.6 MG tablet Take 0.6 mg by mouth daily as needed (for gout). Reported on 05/26/2015 04/09/14  Yes Historical Provider, MD  ferrous sulfate 325 (65 FE) MG tablet Take 1 tablet (325 mg total) by mouth 3 (three) times daily with meals. 07/08/15  Yes Thurnell Lose, MD  Fexofenadine HCl (ALLEGRA PO) Take 1 tablet by mouth daily as needed (seasonal allergies).   Yes Historical Provider, MD  fludrocortisone (FLORINEF) 0.1 MG tablet Take 1 tablet (0.1 mg total) by mouth 2 (two) times daily. 05/26/15  Yes Renato Shin, MD  fluticasone (FLONASE) 50 MCG/ACT nasal spray Place 2 sprays into both nostrils at bedtime as needed (seasonal allergies).    Yes Historical Provider, MD  HYDROcodone-acetaminophen (NORCO/VICODIN) 5-325 MG tablet Take 1 tablet by mouth every 6 (six) hours as needed for moderate pain.   Yes Historical Provider, MD  hydrocortisone (ANUSOL-HC) 25 MG suppository Place 25 mg rectally as needed for hemorrhoids or itching.   Yes Historical Provider, MD  Melatonin 10 MG CAPS Take 10 mg by mouth at bedtime as needed (sleep).    Yes Historical Provider, MD  methylPREDNISolone (MEDROL DOSEPAK) 4 MG TBPK tablet follow package directions  07/02/15  Yes Thurnell Lose, MD  midodrine (PROAMATINE) 2.5 MG tablet Take 1 tablet (2.5 mg total) by mouth 3 (three) times daily with meals. 05/10/15  Yes Renato Shin, MD  nitroGLYCERIN (NITROSTAT) 0.4 MG SL tablet Place 1 tablet (0.4 mg total) under the tongue every 5 (five) minutes as needed. For chest pain Patient taking differently: Place 0.4 mg under the tongue every 5 (five) minutes as needed for chest pain.  05/21/15  Yes Tonia Ghent, MD  pantoprazole (PROTONIX) 40 MG tablet Take 1 tablet (40 mg total) by mouth 2 (two) times daily. 04/05/15  Yes [name], MD  potassium chloride (K-DUR) 10 MEQ tablet Take 2 tablets (20 mEq total) by mouth 3 (three) times daily. 07/14/15  Yes Tonia Ghent, MD  PRESCRIPTION MEDICATION Inhale into the lungs at bedtime. CPAP   Yes Historical Provider, MD  rivaroxaban (XARELTO) 10 MG TABS tablet Take 20 mg by mouth daily.   Yes Historical Provider, MD  rosuvastatin (CRESTOR) 40 MG tablet Take 1 tablet (40 mg total) by mouth daily. Patient taking differently: Take 40 mg by mouth at bedtime.  06/30/15  Yes [name], MD  senna (SENOKOT) 8.6 MG TABS tablet Take 1 tablet by mouth 2 (two) times daily.   Yes Historical Provider, MD  senna-docusate (SENNA S) 8.6-50 MG tablet Take 1 tablet by mouth 2 (two) times daily.   Yes Historical Provider, MD  sertraline (ZOLOFT) 50 MG tablet Take 1 tablet (50 mg total) by mouth daily. 01/09/15  Yes Britt Bottom, MD  tiZANidine (ZANAFLEX) 4 MG tablet Take 4 mg by mouth 2 (two) times daily as needed for muscle spasms.  06/21/15  Yes Historical Provider, MD  vancomycin 1,250 mg in sodium chloride 0.9 % 250 mL Inject 1,250 mg into the vein every 12 (twelve) hours. 10 day supply, HH RN to administer, draw levels 2/week to be followed by [place]. 07/08/15  Yes Thurnell Lose, MD    Physical Exam: Filed Vitals:   07/15/15 0245 07/15/15 0345 07/15/15 0445 07/15/15 0515  BP:   128/66 141/76  Pulse: 74 63 93 63    Temp:  TempSrc:      Resp: 22 27 18 23   SpO2: 96% 95% 93% 94%      Constitutional: NAD, calm, comfortable Filed Vitals:   07/15/15 0245 07/15/15 0345 07/15/15 0445 07/15/15 0515  BP:   128/66 141/76  Pulse: 74 63 93 63  Temp:      TempSrc:      Resp: 22 27 18 23   SpO2: 96% 95% 93% 94%   Eyes: PERRL, lids and conjunctivae normal ENMT: Mucous membranes are moist. Posterior pharynx clear of any exudate or lesions.Normal dentition.  Neck: normal, supple, no masses, no thyromegaly Respiratory: clear to auscultation bilaterally, no wheezing, no crackles. Normal respiratory effort. No accessory muscle use.  Cardiovascular: Regular rate and rhythm, no murmurs / rubs / gallops. No extremity edema. 2+ pedal pulses. No carotid bruits.  Abdomen: no tenderness, no masses palpated. No hepatosplenomegaly. Bowel sounds positive.  Musculoskeletal: no clubbing / cyanosis. No joint deformity upper and lower extremities. Good ROM, no contractures. Normal muscle tone.  Skin: no rashes, lesions, ulcers. No induration Neurologic: CN 2-12 grossly intact. Sensation intact, DTR normal. Strength 5/5 in all 4.  Psychiatric: Normal judgment and insight. Alert and oriented x 3. Normal mood.     Labs on Admission: I have personally reviewed following labs and imaging studies  CBC:  Recent Labs Lab 07/09/15 0650 07/15/15 0150  WBC  --  9.3  HGB 7.9* 9.1*  HCT 25.2* 29.8*  MCV  --  83.0  PLT  --  [id number]   Basic Metabolic Panel:  Recent Labs Lab 07/08/15 1428 07/09/15 0650 07/15/15 0150  NA 138 139 141  K 3.1* 2.8* 3.0*  CL 107 107 110  CO2 21* 21* 23  GLUCOSE 114* 91 106*  BUN 5* 5* 7  CREATININE 0.71 0.68 0.69  CALCIUM 7.6* 7.6* 8.0*  MG  --  1.7  --    GFR: Estimated Creatinine Clearance: 112.6 mL/min (by C-G formula based on Cr of 0.69). Liver Function Tests: No results for input(s): AST, ALT, ALKPHOS, BILITOT, PROT, ALBUMIN in the last 168 hours. No results for input(s):  LIPASE, AMYLASE in the last 168 hours. No results for input(s): AMMONIA in the last 168 hours. Coagulation Profile:  Recent Labs Lab 07/15/15 0150  INR 2.55*   Cardiac Enzymes: No results for input(s): CKTOTAL, CKMB, CKMBINDEX, TROPONINI in the last 168 hours. BNP (last 3 results) No results for input(s): PROBNP in the last 8760 hours. HbA1C: No results for input(s): HGBA1C in the last 72 hours. CBG: No results for input(s): GLUCAP in the last 168 hours. Lipid Profile: No results for input(s): CHOL, HDL, LDLCALC, TRIG, CHOLHDL, LDLDIRECT in the last 72 hours. Thyroid Function Tests: No results for input(s): TSH, T4TOTAL, FREET4, T3FREE, THYROIDAB in the last 72 hours. Anemia Panel: No results for input(s): VITAMINB12, FOLATE, FERRITIN, TIBC, IRON, RETICCTPCT in the last 72 hours. Urine analysis:    Component Value Date/Time   COLORURINE AMBER* 07/01/2015 2010   APPEARANCEUR CLEAR 07/01/2015 2010   LABSPEC 1.030 07/01/2015 2010   PHURINE 6.0 07/01/2015 2010   GLUCOSEU NEGATIVE 07/01/2015 2010   HGBUR NEGATIVE 07/01/2015 2010   BILIRUBINUR SMALL* 07/01/2015 2010   KETONESUR NEGATIVE 07/01/2015 2010   PROTEINUR NEGATIVE 07/01/2015 2010   UROBILINOGEN 0.2 12/25/2014 2016   NITRITE NEGATIVE 07/01/2015 2010   LEUKOCYTESUR NEGATIVE 07/01/2015 2010   Sepsis Labs: @LABRCNTIP (procalcitonin:4,lacticidven:4) ) Recent Results (from the past 240 hour(s))  Culture, blood (routine x 2)     Status: None  Collection Time: 07/05/15 11:38 AM  Result Value Ref Range Status   Specimen Description BLOOD LEFT HAND  Final   Special Requests BOTTLES DRAWN AEROBIC ONLY 10CC  Final   Culture NO GROWTH 5 DAYS  Final   Report Status 07/10/2015 FINAL  Final  Culture, blood (routine x 2)     Status: None   Collection Time: 07/05/15 11:44 AM  Result Value Ref Range Status   Specimen Description BLOOD RIGHT HAND  Final   Special Requests IN PEDIATRIC BOTTLE 2CC  Final   Culture NO GROWTH 5 DAYS   Final   Report Status 07/10/2015 FINAL  Final     Radiological Exams on Admission: Dg Chest 2 View  07/15/2015  CLINICAL DATA:  Acute onset of central squeezing chest pain, radiating to the right jaw. Shortness of breath. Initial encounter. EXAM: CHEST  2 VIEW COMPARISON:  Chest radiograph from 07/01/2015 FINDINGS: The lungs are well-aerated. Peribronchial thickening is noted. Mildly increased interstitial markings may reflect minimal interstitial edema. There is no evidence of pleural effusion or pneumothorax. The heart is borderline normal in size. A loop recorder is noted. The patient is status post median sternotomy. A right PICC is noted ending about the distal SVC. No acute osseous abnormalities are seen. IMPRESSION: Peribronchial thickening noted. Mildly increased interstitial markings may reflect minimal interstitial edema. Electronically Signed   By: Garald Balding M.D.   On: 07/15/2015 02:38    EKG: Independently reviewed.  bigeminy  Assessment/Plan Chest pain: Acute. Initial EKG showing bigeminy, but initial troponin negative. Patient just recently had echocardiogram during last hospitalization in 06/2015 showing EF [id number] with diastolic dysfunction. - Admit to telemetry bed - Trend cardiac troponin  - Repeat EKG - Consult cardiology in a.m. for further evaluation  Hypokalemia: Acute on chronic. Patient currently taking potassium supplements multiple times throughout the day. Potassium still remains low at 3 on admission. - Potassium chloride 40 mEq 1 dose now - Continue home regimen of potassium   Anemia: Hemoglobin 9.1 on admission and improved from 7.3 and previous admission. - Continue to monitor   History of bilateral PEOn chronic anticoagulation - Continue Xarelto  Tonsillitis diagnosed during last hospitalization on 4/16- 4/22 - Patient reports 3 doses of amoxicillin left to take from previous hospitalization  Coag negative staph bacteremia diagnosed during last  hospitalization on 4/16-4/22 - PICC line in place to receive vancomycin  OSA on CPAP  - Respiratory to supply   CVA with residual left-sided hemiparesis: Stable   DVT prophylaxis: Xarelto Code Status: Full Family Communication: Discussed plan with wife  Disposition Plan:  Consults called: None Admission status: Telemetry observation   Norval Morton MD [facility] Pager [phone]  If 7PM-7AM, please contact night-coverage [email or web address] Password [facility]  07/15/2015, 5:38 AM

## 2015-07-15 NOTE — ED Provider Notes (Signed)
CSN: [id number]     Arrival date & time 07/15/15  0130 History  By signing my name below, I, Terrance Branch, attest that this documentation has been prepared under the direction and in the presence of Varney Biles, MD. Electronically Signed: Randa Evens, ED Scribe. 07/15/2015. 1:58 AM.     Chief Complaint  Patient presents with  . Chest Pain   Patient is a 65 y.o. male presenting with chest pain. The history is provided by the patient. No language interpreter was used.  Chest Pain  HPI Comments: Gregory Wiley is a 65 y.o. male with extensive medical Hx listed below who presents to the Emergency Department complaining of squeezing waxing and waning CP onset tonight at 11 PM. He rates the severity of his pain 3/10. He states the he was at rest during the onset of pain. Pt states that the pain is non radiating. Pt reports feeling SOB during the onset of CP. Pt also reports right sided jaw pain that began. Pt has had 1 nitroglycerin PTA with slight relief. Denies nausea, diaphoresis, fever, chills or dental problem. Denies recent cardiac stress testing. Pt states that this pain does not feel like previous MI.     Past Medical History  Diagnosis Date  . Coronary artery disease     stent, then CABG 07/20/10  . Hypertension   . Hyperlipidemia   . Obesity (BMI 30-39.9)   . H/O cardiomyopathy     ischemic, now last echo 07/30/11, EF 38%W  . Diverticulosis   . Internal hemorrhoids   . Tubular adenoma of colon   . Plantar fasciitis of left foot   . Basal cell carcinoma of skin of left ankle   . NSTEMI (non-ST elevated myocardial infarction) ([place])     NSTEMI- last cath 06/2011-Stent to LCX-DES, last nuc 07/30/11 low risk  . MI (myocardial infarction) ([place])     "I had 4 before I retired in 2006" (11/11/2014)  . Pulmonary embolism ([place]) 03/2014  . GERD (gastroesophageal reflux disease)   . Bleeding stomach ulcer 2015  . Daily headache     "for the past month" (11/11/2014)  . Stroke ([place])  01/2014    "no control of LUE, can slightly LLE; memory problems since" (11/11/2014)  . Arthritis     "back" (11/11/2014)  . Gout   . Depression   . Pneumonia     hosp. 12/2014  . Anxiety   . History of hiatal hernia   . 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 OSA on CPAP     since July 2013- uses CPAP sometimes , states he has lost 147 lbs. since stroke & doesn't use the CPAP as much as he use to.   . Adrenal insufficiency ([place])   . Left hemiplegia [facility])    Past Surgical History  Procedure Laterality Date  . Coronary artery bypass graft  07/20/2010     LIMA-LAD; VG-ACUTE MARG of RCA; Seq VG-distal RCA & then pda  . Coronary angioplasty with stent placement  07/01/2011    DES-Resolute to native LCX  . Coronary angioplasty with stent placement  12/01/2007    COMPLEX 5 LESION PCI INCLUDING CUTTING BALLOON AND 4 CYPHER DESs  . Knee arthroscopy Right   . Hand surgery Right     to take glass out  . Colonoscopy N/A 02/10/2013    Procedure: COLONOSCOPY;  Surgeon: Ladene Artist, MD;  Location: [facility] ENDOSCOPY;  Service: Endoscopy;  Laterality: N/A;  . Retinal detachment surgery Right   . Loop recorder implant  02/15/2014    MDT LINQ implanted by Dr Rayann Heman for cryptogenic stroke  . Tee without cardioversion N/A 02/15/2014    Procedure: TRANSESOPHAGEAL ECHOCARDIOGRAM (TEE);  Surgeon: Josue Hector, MD;  Location: [facility] ENDOSCOPY;  Service: Cardiovascular;  Laterality: N/A;  . Left heart catheterization with coronary/graft angiogram  07/01/2011    Procedure: LEFT HEART CATHETERIZATION WITH Beatrix Fetters;  Surgeon: Sanda Klein, MD;  Location: [facility] CATH LAB;  Service: Cardiovascular;;  . Percutaneous coronary stent intervention (pci-s) Right 07/01/2011    Procedure: PERCUTANEOUS CORONARY STENT INTERVENTION (PCI-S);  Surgeon: Sanda Klein, MD;  Location[facility] C[facility] LAB;  Service: Cardiovascular;  Laterality: Right;  . Umbilical hernia repair  2006  . Hernia repair     . Basal cell carcinoma excision Left 2015    ankle  . Esophagogastroduodenoscopy  01/2014    gastric ulcer, erosive gastroduodenitis, H Pylori negative.   . Esophagogastroduodenoscopy (egd) with propofol N/A 11/14/2014    Procedure: ESOPHAGOGASTRODUODENOSCOPY (EGD) WITH PROPOFOL;  Surgeon: Milus Banister, MD;  Location: [place];  Service: Endoscopy;  Laterality: N/A;  . Intramedullary (im) nail intertrochanteric Right 02/07/2015    Procedure: INTRAMEDULLARY (IM) NAIL INTERTROCHANTRIC RIGHT HIP;  Surgeon: Renette Butters, MD;  Location: [place];  Service: Orthopedics;  Laterality: Right;  . Cardiac surgery     Family History  Problem Relation Age of Onset  . Diabetes type II Mother   . Coronary artery disease    . Colon cancer Neg Hx   . CAD Father 5    CABG, PCI, died at 38  . Heart attack Father 1  . Hypertension Brother   . Diabetes Brother   . Hyperlipidemia Brother   . Brain cancer Maternal Grandmother   . COPD Paternal Grandfather   . Other      denies family history of DVT/PE  . Stroke Neg Hx   . Other Neg Hx     adrenal dz   Social History  Substance Use Topics  . Smoking status: Former Smoker -- 2.00 packs/day for 4 years    Types: Cigarettes    Quit date: 01/30/1969  . Smokeless tobacco: Never Used     Comment: "quit smoking cigarettes  in the 1970's"  . Alcohol Use: No    Review of Systems  Cardiovascular: Positive for chest pain.   A complete 10 system review of systems was obtained and all systems are negative except as noted in the HPI and PMH.     Allergies  Diphenhydramine hcl and Adhesive  Home Medications   Prior to Admission medications   Medication Sig Start Date End Date Taking? Authorizing Provider  acetaminophen (TYLENOL) 325 MG tablet Take 1 tablet (325 mg total) by mouth every 6 (six) hours as needed for mild pain. 12/27/14  Yes Florencia Reasons, MD  ALPRAZolam Duanne Moron) 0.25 MG tablet Take 0.5 tablets (0.125 mg total) by mouth every 8  (eight) hours as needed for anxiety. 04/14/14  Yes Tiffany L Reed, DO  amoxicillin (AMOXIL) 500 MG capsule Take 1 capsule (500 mg total) by mouth every 12 (twelve) hours. 07/08/15  Yes Thurnell Lose, MD  aspirin EC 81 MG tablet Take 1 tablet (81 mg total) by mouth daily. 07/11/15  Yes Thurnell Lose, MD  colchicine 0.6 MG tablet Take 0.6 mg by mouth daily as needed (for gout). Reported on 05/26/2015 04/09/14  Yes Historical Provider, MD  ferrous sulfate  325 (65 FE) MG tablet Take 1 tablet (325 mg total) by mouth 3 (three) times daily with meals. 07/08/15  Yes Thurnell Lose, MD  Fexofenadine HCl (ALLEGRA PO) Take 1 tablet by mouth daily as needed (seasonal allergies).   Yes Historical Provider, MD  fludrocortisone (FLORINEF) 0.1 MG tablet Take 1 tablet (0.1 mg total) by mouth 2 (two) times daily. 05/26/15  Yes Renato Shin, MD  fluticasone (FLONASE) 50 MCG/ACT nasal spray Place 2 sprays into both nostrils at bedtime as needed (seasonal allergies).    Yes Historical Provider, MD  HYDROcodone-acetaminophen (NORCO/VICODIN) 5-325 MG tablet Take 1 tablet by mouth every 6 (six) hours as needed for moderate pain.   Yes Historical Provider, MD  hydrocortisone (ANUSOL-HC) 25 MG suppository Place 25 mg rectally as needed for hemorrhoids or itching.   Yes Historical Provider, MD  Melatonin 10 MG CAPS Take 10 mg by mouth at bedtime as needed (sleep).    Yes Historical Provider, MD  methylPREDNISolone (MEDROL DOSEPAK) 4 MG TBPK tablet follow package directions 07/02/15  Yes Thurnell Lose, MD  midodrine (PROAMATINE) 2.5 MG tablet Take 1 tablet (2.5 mg total) by mouth 3 (three) times daily with meals. 05/10/15  Yes Renato Shin, MD  nitroGLYCERIN (NITROSTAT) 0.4 MG SL tablet Place 1 tablet (0.4 mg total) under the tongue every 5 (five) minutes as needed. For chest pain Patient taking differently: Place 0.4 mg under the tongue every 5 (five) minutes as needed for chest pain.  05/21/15  Yes Tonia Ghent, MD   pantoprazole (PROTONIX) 40 MG tablet Take 1 tablet (40 mg total) by mouth 2 (two) times daily. 04/05/15  Yes [name], MD  potassium chloride (K-DUR) 10 MEQ tablet Take 2 tablets (20 mEq total) by mouth 3 (three) times daily. 07/14/15  Yes Tonia Ghent, MD  PRESCRIPTION MEDICATION Inhale into the lungs at bedtime. CPAP   Yes Historical Provider, MD  rivaroxaban (XARELTO) 10 MG TABS tablet Take 20 mg by mouth daily.   Yes Historical Provider, MD  rosuvastatin (CRESTOR) 40 MG tablet Take 1 tablet (40 mg total) by mouth daily. Patient taking differently: Take 40 mg by mouth at bedtime.  06/30/15  Yes [name], MD  senna (SENOKOT) 8.6 MG TABS tablet Take 1 tablet by mouth 2 (two) times daily.   Yes Historical Provider, MD  senna-docusate (SENNA S) 8.6-50 MG tablet Take 1 tablet by mouth 2 (two) times daily.   Yes Historical Provider, MD  sertraline (ZOLOFT) 50 MG tablet Take 1 tablet (50 mg total) by mouth daily. 01/09/15  Yes Britt Bottom, MD  tiZANidine (ZANAFLEX) 4 MG tablet Take 4 mg by mouth 2 (two) times daily as needed for muscle spasms.  06/21/15  Yes Historical Provider, MD  vancomycin 1,250 mg in sodium chloride 0.9 % 250 mL Inject 1,250 mg into the vein every 12 (twelve) hours. 10 day supply, HH RN to administer, draw levels 2/week to be followed by [place]. 07/08/15  Yes Thurnell Lose, MD   BP 138/77 mmHg  Pulse 63  Temp(Src) 97.6 F (36.4 C) (Oral)  Resp 27  SpO2 95%   Physical Exam  Constitutional: He is oriented to person, place, and time. He appears well-developed and well-nourished. No distress.  HENT:  Head: Normocephalic and atraumatic.  Mouth/Throat: No trismus in the jaw.  Jaw pain not worse with palpation of temporal artery. No evidence of tooth decay or edema of gingiva.   Eyes: Conjunctivae and EOM  are normal.  Neck: Neck supple. No tracheal deviation present.  Cardiovascular: Normal rate and regular rhythm.   Murmur heard.  Systolic murmur is  present  Pulses:      Radial pulses are 2+ on the right side, and 2+ on the left side.  Pulmonary/Chest: Effort normal. No respiratory distress.  Lungs clear to auscultation. Chest wall tenderness non reproducible with palpation.   Musculoskeletal: Normal range of motion.  Neurological: He is alert and oriented to person, place, and time.  Skin: Skin is warm and dry.  Psychiatric: He has a normal mood and affect. His behavior is normal.  Nursing note and vitals reviewed.   ED Course  Procedures (including critical care time) DIAGNOSTIC STUDIES: Oxygen Saturation is 97% on RA, adequate by my interpretation.    COORDINATION OF CARE: 1:56 AM-Discussed treatment plan with pt at bedside and pt agreed to plan.     Labs Review Labs Reviewed  BASIC METABOLIC PANEL - Abnormal; Notable for the following:    Potassium 3.0 (*)    Glucose, Bld 106 (*)    Calcium 8.0 (*)    All other components within normal limits  CBC - Abnormal; Notable for the following:    RBC 3.59 (*)    Hemoglobin 9.1 (*)    HCT 29.8 (*)    MCH 25.3 (*)    RDW 16.5 (*)    All other components within normal limits  PROTIME-INR - Abnormal; Notable for the following:    Prothrombin Time 27.1 (*)    INR 2.55 (*)    All other components within normal limits  I-STAT TROPOININ, ED  I-STAT TROPOININ, ED    Imaging Review Dg Chest 2 View  07/15/2015  CLINICAL DATA:  Acute onset of central squeezing chest pain, radiating to the right jaw. Shortness of breath. Initial encounter. EXAM: CHEST  2 VIEW COMPARISON:  Chest radiograph from 07/01/2015 FINDINGS: The lungs are well-aerated. Peribronchial thickening is noted. Mildly increased interstitial markings may reflect minimal interstitial edema. There is no evidence of pleural effusion or pneumothorax. The heart is borderline normal in size. A loop recorder is noted. The patient is status post median sternotomy. A right PICC is noted ending about the distal SVC. No acute  osseous abnormalities are seen. IMPRESSION: Peribronchial thickening noted. Mildly increased interstitial markings may reflect minimal interstitial edema. Electronically Signed   By: Garald Balding M.D.   On: 07/15/2015 02:38      EKG Interpretation   Date/Time:  Saturday July 15 2015 01:37:20 EDT Ventricular Rate:  75 PR Interval:  134 QRS Duration: 84 QT Interval:  414 QTC Calculation: 462 R Axis:   -4 Text Interpretation:  Normal sinus rhythm Anteroseptal infarct , age  undetermined Abnormal ECG Nonspecific ST and T wave abnormality No  significant change since last tracing Confirmed by Kathrynn Humble, MD, Thelma Comp  [phone]) on 07/15/2015 1:57:02 AM      MDM   Final diagnoses:  Atypical chest pain    I personally performed the services described in this documentation, which was scribed in my presence. The recorded information has been reviewed and is accurate.  Pt with history of CAD, HTN, HLD, adrenal insufficiency, PE (anticoagulated with Xarelto), prior CVA with left sided weakness, and OSA comes in with chest pain. Chest pain is atypical - but has some concerning features - jaw pain, squeezing pain. Nitro helped. EKG has no acute signs of STEMI. Trop is neg, will admit. Currently chest pain free.  Varney Biles, MD 07/15/15 [phone]

## 2015-07-15 NOTE — Consult Note (Signed)
Cardiology Consult Note  Admit date: 07/15/2015 Name: Gregory Wiley 65 y.o.  male DOB:  [birth year] MRN:  [id number]  Today's date:  07/15/2015  Referring Physician:    [facility]  Primary Physician:    Dr. [name]  Reason for Consultation:    Chest pain  IMPRESSIONS: 1.  Prolonged chest discomfort with different features and previous anginal type pain  In a patient with coronary artery disease previous submassive PE and recent rectus sheath hematoma.   This may not be angina.  He does not have acute changes on his EKG and enzymes initially are negative 2.  Coronary artery disease with previous bypass grafting and previous stenting of the marginal branch since bypass grafting 3.  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  History of submassive pulmonary embolus in January 2016 5.  Recent rectus sheath hematoma 6.  Recent staph bacteremia receiving intravenous antibiotics 7.  Chronic systolic heart failure 8.  Anemia 9.  Hypercholesterolemia  RECOMMENDATION: His EKG is nonischemic.  Previous echo shows an EF of around 40%.  He is at risk of having had a recurrent pulmonary embolus.  We'll check a d-dimer and he should be restarted on his anticoagulation. It might be worth changing him to Eliquis because of the previous bleed on Xarelto.  At this point would continue to cycle enzymes.  If they remain negative we can either follow him clinically or consider a myocardial perfusion scan.  We will reassess him in the morning after further troponins.   HISTORY: This unfortunate 65 year old male has a history of coronary artery disease  Bypass grafting following multiple stents and then a stent to the circumflex that was unbypassed in 2013.  He had a submassive pulmonary embolus  In January 2016 and has a previous right MCA stroke and also has a history of a GI bleed.  He was recently in the hospital with a tonsillar abscess and then  developed staph bacteremia and is currently being treated for that.  He developed a rectus sheath hematoma and his Xarelto was interrupted  Partially. Last evening he developed jaw pain as well as some tightness in his chest associated with diaphoresis and some dyspnea.  He was given nitroglycerin and aspirin in the chest symptoms resolved following that.  Initial troponin was unremarkable.He has been pain-free since then.  Of note  He was having some difficulty walking on his left leg and then it began to cramp and hurt and he was wondering whether or not he could've had a recurrent pulmonary embolus.Normally doesn't have a history of angina.  Denies PND, orthopnea or edema.  Past Medical History  Diagnosis Date  . Coronary artery disease     stent, then CABG 07/20/10  . Hypertension   . Hyperlipidemia   . Obesity (BMI 30-39.9)   . H/O cardiomyopathy     ischemic, now last echo 07/30/11, EF 38%W  . Diverticulosis   . Internal hemorrhoids   . Tubular adenoma of colon   . Plantar fasciitis of left foot   . Basal cell carcinoma of skin of left ankle   . NSTEMI (non-ST elevated myocardial infarction) ([place])     NSTEMI- last cath 06/2011-Stent to LCX-DES, last nuc 07/30/11 low risk  . Pulmonary embolism ([place]) 03/2014  . GERD (gastroesophageal reflux disease)   . Bleeding stomach ulcer 2015  . Daily headache     "for the past month" (11/11/2014)  . Stroke [facility]) 01/2014    "  no control of LUE, can slightly LLE; memory problems since" (11/11/2014)  . Gout   . Depression   . Pneumonia     hosp. 12/2014  . Anxiety   . History of hiatal hernia   . OSA on CPAP     since July 2013- uses CPAP sometimes , states he has lost 147 lbs. since stroke & doesn't use the CPAP as much as he use to.   . Adrenal insufficiency ([place])   . Left hemiplegia [facility])       Past Surgical History  Procedure Laterality Date  . Coronary artery bypass graft  07/20/2010     LIMA-LAD; VG-ACUTE MARG of RCA; Seq VG-distal RCA &  then pda  . Coronary angioplasty with stent placement  07/01/2011    DES-Resolute to native LCX  . Coronary angioplasty with stent placement  12/01/2007    COMPLEX 5 LESION PCI INCLUDING CUTTING BALLOON AND 4 CYPHER DESs  . Knee arthroscopy Right   . Hand surgery Right     to take glass out  . Colonoscopy N/A 02/10/2013    Procedure: COLONOSCOPY;  Surgeon: Ladene Artist, MD;  Location: [facility] ENDOSCOPY;  Service: Endoscopy;  Laterality: N/A;  . Retinal detachment surgery Right   . Loop recorder implant  02/15/2014    MDT LINQ implanted by Dr Rayann Heman for cryptogenic stroke  . Tee without cardioversion N/A 02/15/2014    Procedure: TRANSESOPHAGEAL ECHOCARDIOGRAM (TEE);  Surgeon: Josue Hector, MD;  Location: [facility] ENDOSCOPY;  Service: Cardiovascular;  Laterality: N/A;  . Left heart catheterization with coronary/graft angiogram  07/01/2011    Procedure: LEFT HEART CATHETERIZATION WITH Beatrix Fetters;  Surgeon: Sanda Klein, MD;  Location: [place] CATH LAB;  Service: Cardiovascular;;  . Percutaneous coronary stent intervention (pci-s) Right 07/01/2011    Procedure: PERCUTANEOUS CORONARY STENT INTERVENTION (PCI-S);  Surgeon: Sanda Klein, MD;  Location: [facility] CATH LAB;  Service: Cardiovascular;  Laterality: Right;  . Umbilical hernia repair  2006  . Hernia repair    . Basal cell carcinoma excision Left 2015    ankle  . Esophagogastroduodenoscopy  01/2014    gastric ulcer, erosive gastroduodenitis, H Pylori negative.   . Esophagogastroduodenoscopy (egd) with propofol N/A 11/14/2014    Procedure: ESOPHAGOGASTRODUODENOSCOPY (EGD) WITH PROPOFOL;  Surgeon: Milus Banister, MD;  Location: [place];  Service: Endoscopy;  Laterality: N/A;  . Intramedullary (im) nail intertrochanteric Right 02/07/2015    Procedure: INTRAMEDULLARY (IM) NAIL INTERTROCHANTRIC RIGHT HIP;  Surgeon: Renette Butters, MD;  Location: [place];  Service: Orthopedics;  Laterality: Right;  . Cardiac surgery       Allergies:  is  allergic to diphenhydramine hcl and adhesive.   Medications: Prior to Admission medications   Medication Sig Start Date End Date Taking? Authorizing Provider  acetaminophen (TYLENOL) 325 MG tablet Take 1 tablet (325 mg total) by mouth every 6 (six) hours as needed for mild pain. 12/27/14  Yes Florencia Reasons, MD  ALPRAZolam Duanne Moron) 0.25 MG tablet Take 0.5 tablets (0.125 mg total) by mouth every 8 (eight) hours as needed for anxiety. 04/14/14  Yes Tiffany L Reed, DO  amoxicillin (AMOXIL) 500 MG capsule Take 1 capsule (500 mg total) by mouth every 12 (twelve) hours. 07/08/15  Yes Thurnell Lose, MD  aspirin EC 81 MG tablet Take 1 tablet (81 mg total) by mouth daily. 07/11/15  Yes Thurnell Lose, MD  colchicine 0.6 MG tablet Take 0.6 mg by mouth daily as needed (for gout). Reported on 05/26/2015 04/09/14  Yes Historical Provider, MD  ferrous sulfate 325 (65 FE) MG tablet Take 1 tablet (325 mg total) by mouth 3 (three) times daily with meals. 07/08/15  Yes Thurnell Lose, MD  Fexofenadine HCl (ALLEGRA PO) Take 1 tablet by mouth daily as needed (seasonal allergies).   Yes Historical Provider, MD  fludrocortisone (FLORINEF) 0.1 MG tablet Take 1 tablet (0.1 mg total) by mouth 2 (two) times daily. 05/26/15  Yes Renato Shin, MD  fluticasone (FLONASE) 50 MCG/ACT nasal spray Place 2 sprays into both nostrils at bedtime as needed (seasonal allergies).    Yes Historical Provider, MD  HYDROcodone-acetaminophen (NORCO/VICODIN) 5-325 MG tablet Take 1 tablet by mouth every 6 (six) hours as needed for moderate pain.   Yes Historical Provider, MD  hydrocortisone (ANUSOL-HC) 25 MG suppository Place 25 mg rectally as needed for hemorrhoids or itching.   Yes Historical Provider, MD  Melatonin 10 MG CAPS Take 10 mg by mouth at bedtime as needed (sleep).    Yes Historical Provider, MD  methylPREDNISolone (MEDROL DOSEPAK) 4 MG TBPK tablet follow package directions 07/02/15  Yes Thurnell Lose, MD  midodrine (PROAMATINE) 2.5 MG  tablet Take 1 tablet (2.5 mg total) by mouth 3 (three) times daily with meals. 05/10/15  Yes Renato Shin, MD  nitroGLYCERIN (NITROSTAT) 0.4 MG SL tablet Place 1 tablet (0.4 mg total) under the tongue every 5 (five) minutes as needed. For chest pain Patient taking differently: Place 0.4 mg under the tongue every 5 (five) minutes as needed for chest pain.  05/21/15  Yes Tonia Ghent, MD  pantoprazole (PROTONIX) 40 MG tablet Take 1 tablet (40 mg total) by mouth 2 (two) times daily. 04/05/15  Yes [name], MD  potassium chloride (K-DUR) 10 MEQ tablet Take 2 tablets (20 mEq total) by mouth 3 (three) times daily. 07/14/15  Yes Tonia Ghent, MD  PRESCRIPTION MEDICATION Inhale into the lungs at bedtime. CPAP   Yes Historical Provider, MD  rivaroxaban (XARELTO) 10 MG TABS tablet Take 20 mg by mouth daily.   Yes Historical Provider, MD  rosuvastatin (CRESTOR) 40 MG tablet Take 1 tablet (40 mg total) by mouth daily. Patient taking differently: Take 40 mg by mouth at bedtime.  06/30/15  Yes [name], MD  senna (SENOKOT) 8.6 MG TABS tablet Take 1 tablet by mouth 2 (two) times daily.   Yes Historical Provider, MD  senna-docusate (SENNA S) 8.6-50 MG tablet Take 1 tablet by mouth 2 (two) times daily.   Yes Historical Provider, MD  sertraline (ZOLOFT) 50 MG tablet Take 1 tablet (50 mg total) by mouth daily. 01/09/15  Yes Britt Bottom, MD  tiZANidine (ZANAFLEX) 4 MG tablet Take 4 mg by mouth 2 (two) times daily as needed for muscle spasms.  06/21/15  Yes Historical Provider, MD  vancomycin 1,250 mg in sodium chloride 0.9 % 250 mL Inject 1,250 mg into the vein every 12 (twelve) hours. 10 day supply, HH RN to administer, draw levels 2/week to be followed by [place]. 07/08/15  Yes Thurnell Lose, MD    Family History: Family Status  Relation Status Death Age  . Mother Deceased   . Father Deceased   . Sister Alive   . Brother Alive   . Maternal Grandmother Deceased   . Paternal Grandfather  Deceased     Social History:   reports that he quit smoking about 46 years ago. His smoking use included Cigarettes. He has a 8 pack-year smoking history. He  has never used smokeless tobacco. He reports that he uses illicit drugs (Marijuana). He reports that he does not drink alcohol.   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      Review of Systems: He noted some difficulty with difficulty standing on his left leg recently and try and walk  And had some pain in that leg that was of concern to him.  Has not had severe shortness of breath.  Does have significant malaise and fatigue.  Other than as noted above remainder of the review of systems is unremarkable.  Physical Exam: BP 130/75 mmHg  Pulse 66  Temp(Src) 98.6 F (37 C) (Oral)  Resp 24  Wt 94.031 kg (207 lb 4.8 oz)  SpO2 94%  General appearance: pleasant male laying in bed currently in no acute distress Head: Normocephalic, without obvious abnormality, atraumatic Eyes: conjunctivae/corneas clear. PERRL, EOM's intact. Fundi not examined Neck: no adenopathy, no carotid bruit, no JVD and supple, symmetrical, trachea midline Lungs: clear to auscultation bilaterally Heart: regular rate and rhythm, S1, S2 normal, no murmur, click, rub or gallop Abdomen: soft, non-tender; bowel sounds normal; no masses,  no organomegaly Rectal: deferred Extremities: extremities normal, atraumatic, no cyanosis or edema Pulses: 2+ and symmetric Skin: Skin color, texture, turgor normal. No rashes or lesions Neurologic: There is left hemiparesis noted, some left Side neglect also.  Labs: CBC  Recent Labs  07/15/15 0150  WBC 9.3  RBC 3.59*  HGB 9.1*  HCT 29.8*  PLT 227  MCV 83.0  MCH 25.3*  MCHC 30.5  RDW 16.5*   CMP   Recent Labs  07/15/15 0150  NA 141  K  3.0*  CL 110  CO2 23  GLUCOSE 106*  BUN 7  CREATININE 0.69  CALCIUM 8.0*  GFRNONAA >60  GFRAA >60   BNP (last 3 results) BNP    Component Value Date/Time   BNP 103.3* 04/11/2014 2310   Cardiac Panel (last 3 results) Cardiac Panel (last 3 results)  Recent Labs  07/15/15 1000  TROPONINI 0.03     Radiology: Borderline heart size, mild peri interstitial edema  EKG: Previous anterior infarction, sinus with PACs  Signed:  W. Doristine Church MD [facility]   Cardiology Consultant  07/15/2015, 2:52 PM

## 2015-07-15 NOTE — Progress Notes (Signed)
Patient ID: Gregory Wiley, male   DOB: [birth year], 65 y.o.   MRN: [id number]   PROGRESS NOTE    Gregory Wiley  [id number] DOB: 07-15-1950 DOA: 07/15/2015  PCP: [name], MD   Outpatient Specialists:   Brief Narrative:  65 y.o. male with known CAD, HTN, HLD, chronic adrenal insufficiency, PE on Xarelto, CVA, OSA on CPAP presents to [facility] ED with main concern of sudden onset of squeezing type of chest pain that radiated to the right jaw and occasionally to the right scapular region, associated with dyspnea, relieved by nitro drip.   Assessment & Plan:   Active Problems:   Chest pain - currently no chest pain - first troponin unremarkable - continue to cycle cardiac enzymes, monitor on telemetry  - cardiology consulted     CAD, RCA PCI '09, 10/11, CABG X 4 5/12    Hypokalemia - continue to supplement and repeat BMP in AM    Chronic combined systolic and diastolic congestive heart failure ([place]) - currently euvolemic - weight 94 kg this AM  - daily weights, strict I/O    OSA (obstructive sleep apnea) - CPAP at night     Acute tonsillitis (on recent admission)   Bacteremia due to Gram-positive bacteria - Tonsillitis diagnosed during last hospitalization on 4/16- 4/22 - Coag negative staph bacteremia diagnosed during last hospitalization on 4/16-4/22 - PICC line in place to receive vancomycin, pt to take for total two weeks (startig 0000000)    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)   Hemiplegia affecting left dominant side ([place]) - on Xarelto   DVT prophylaxis: On Xarelto (recently restarted 4/26) Code Status: Full  Family Communication: Patient at bedside  Disposition Plan: Home in 1-2 days if no further cardiac work up recommended   Consultants:   Cardiology   Procedures:   None   Antimicrobials:   None    Subjective: Pt reports feeling better, denies chest pain.   Objective: Filed Vitals:   07/15/15 0345 07/15/15 0445 07/15/15  0515 07/15/15 0600  BP:  128/66 141/76 153/95  Pulse: 63 93 63 57  Temp:    99.1 F (37.3 C)  TempSrc:      Resp: 27 18 23 24   Weight:    94.031 kg (207 lb 4.8 oz)  SpO2: 95% 93% 94% 95%    Intake/Output Summary (Last 24 hours) at 07/15/15 0737 Last data filed at 07/15/15 0601  Gross per 24 hour  Intake      0 ml  Output      0 ml  Net      0 ml   Filed Weights   07/15/15 0600  Weight: 94.031 kg (207 lb 4.8 oz)    Examination:  General exam: Appears calm and comfortable  Respiratory system: Clear to auscultation. Respiratory effort normal. Diminished breath sounds at bases  Cardiovascular system: S1 & S2 heard, Regular rhythm, bradycardic. No JVD, murmurs, rubs, gallops or clicks. No pedal edema. Gastrointestinal system: Abdomen is nondistended, soft and nontender.  Central nervous system: Alert and oriented. No focal neurological deficits. Extremities: L hemiparesis (residular from R MCA stroke) Psychiatry: Judgement and insight appear normal. Mood & affect appropriate.   Data Reviewed: I have personally reviewed following labs and imaging studies  CBC:  Recent Labs Lab 07/09/15 0650 07/15/15 0150  WBC  --  9.3  HGB 7.9* 9.1*  HCT 25.2* 29.8*  MCV  --  83.0  PLT  --  227   Basic  Metabolic Panel:  Recent Labs Lab 07/08/15 1428 07/09/15 0650 07/15/15 0150  NA 138 139 141  K 3.1* 2.8* 3.0*  CL 107 107 110  CO2 21* 21* 23  GLUCOSE 114* 91 106*  BUN 5* 5* 7  CREATININE 0.71 0.68 0.69  CALCIUM 7.6* 7.6* 8.0*  MG  --  1.7  --    Coagulation Profile:  Recent Labs Lab 07/15/15 0150  INR 2.55*   Urine analysis:    Component Value Date/Time   COLORURINE AMBER* 07/01/2015 2010   APPEARANCEUR CLEAR 07/01/2015 2010   LABSPEC 1.030 07/01/2015 2010   PHURINE 6.0 07/01/2015 2010   GLUCOSEU NEGATIVE 07/01/2015 2010   [place] NEGATIVE 07/01/2015 2010   BILIRUBINUR SMALL* 07/01/2015 2010   [place] NEGATIVE 07/01/2015 2010   PROTEINUR NEGATIVE 07/01/2015  2010   UROBILINOGEN 0.2 12/25/2014 2016   NITRITE NEGATIVE 07/01/2015 2010   LEUKOCYTESUR NEGATIVE 07/01/2015 2010    Recent Results (from the past 240 hour(s))  Culture, blood (routine x 2)     Status: None   Collection Time: 07/05/15 11:38 AM  Result Value Ref Range Status   Specimen Description BLOOD LEFT HAND  Final   Special Requests BOTTLES DRAWN AEROBIC ONLY 10CC  Final   Culture NO GROWTH 5 DAYS  Final   Report Status 07/10/2015 FINAL  Final  Culture, blood (routine x 2)     Status: None   Collection Time: 07/05/15 11:44 AM  Result Value Ref Range Status   Specimen Description BLOOD RIGHT HAND  Final   Special Requests IN PEDIATRIC BOTTLE [facility]  Final   Culture NO GROWTH 5 DAYS  Final   Report Status 07/10/2015 FINAL  Final     Radiology Studies: Dg Chest 2 View 07/15/2015  Peribronchial thickening noted. Mildly increased interstitial markings may reflect minimal interstitial edema.   Scheduled Meds: . amoxicillin  500 mg Oral Q12H  . aspirin EC  81 mg Oral Daily  . ferrous sulfate  325 mg Oral TID WC  . fludrocortisone  0.1 mg Oral BID  . loratadine  10 mg Oral Daily  . midodrine  2.5 mg Oral TID WC  . pantoprazole  40 mg Oral BID  . potassium chloride  20 mEq Oral TID  . rivaroxaban  20 mg Oral Q supper  . rosuvastatin  40 mg Oral QHS  . senna  1 tablet Oral BID  . senna-docusate  1 tablet Oral BID  . sertraline  50 mg Oral Daily  . vancomycin (VANCOCIN) 1250 mg IVPB  1,250 mg Intravenous Q12H   Continuous Infusions:  Time spent: 20 minutes   Faye Ramsay, MD [facility] Pager [phone]  If 7PM-7AM, please contact night-coverage [email or web address] Password [email or web address] 07/15/2015, 7:37 AM

## 2015-07-15 NOTE — ED Notes (Signed)
Pt reports taking 1 SL nitro at 0048 PTA and pain decreased from 6/10 to 3/10.

## 2015-07-16 ENCOUNTER — Observation Stay (HOSPITAL_COMMUNITY): Payer: Medicare Other

## 2015-07-16 DIAGNOSIS — E876 Hypokalemia: Secondary | ICD-10-CM | POA: Diagnosis not present

## 2015-07-16 DIAGNOSIS — I2510 Atherosclerotic heart disease of native coronary artery without angina pectoris: Secondary | ICD-10-CM | POA: Diagnosis not present

## 2015-07-16 DIAGNOSIS — R079 Chest pain, unspecified: Secondary | ICD-10-CM | POA: Diagnosis not present

## 2015-07-16 DIAGNOSIS — S301XXD Contusion of abdominal wall, subsequent encounter: Secondary | ICD-10-CM | POA: Diagnosis not present

## 2015-07-16 DIAGNOSIS — A499 Bacterial infection, unspecified: Secondary | ICD-10-CM

## 2015-07-16 DIAGNOSIS — R0789 Other chest pain: Secondary | ICD-10-CM | POA: Insufficient documentation

## 2015-07-16 DIAGNOSIS — Z7901 Long term (current) use of anticoagulants: Secondary | ICD-10-CM | POA: Diagnosis not present

## 2015-07-16 DIAGNOSIS — I5042 Chronic combined systolic (congestive) and diastolic (congestive) heart failure: Secondary | ICD-10-CM

## 2015-07-16 LAB — CBC
HCT: 29.4 % — ABNORMAL LOW (ref 39.0–52.0)
Hemoglobin: 8.9 g/dL — ABNORMAL LOW (ref 13.0–17.0)
MCH: 25.4 pg — ABNORMAL LOW (ref 26.0–34.0)
MCHC: 30.3 g/dL (ref 30.0–36.0)
MCV: 83.8 fL (ref 78.0–100.0)
Platelets: 205 10*3/uL (ref 150–400)
RBC: 3.51 MIL/uL — ABNORMAL LOW (ref 4.22–5.81)
RDW: 17.5 % — ABNORMAL HIGH (ref 11.5–15.5)
WBC: 7.2 10*3/uL (ref 4.0–10.5)

## 2015-07-16 LAB — BASIC METABOLIC PANEL
Anion gap: 6 (ref 5–15)
BUN: <5 mg/dL — ABNORMAL LOW (ref 6–20)
CO2: 25 mmol/L (ref 22–32)
Calcium: 7.9 mg/dL — ABNORMAL LOW (ref 8.9–10.3)
Chloride: 109 mmol/L (ref 101–111)
Creatinine, Ser: 0.71 mg/dL (ref 0.61–1.24)
GFR calc Af Amer: >60 mL/min (ref >60)
GFR calc non Af Amer: >60 mL/min (ref >60)
Glucose, Bld: 99 mg/dL (ref 65–99)
Potassium: 3.1 mmol/L — ABNORMAL LOW (ref 3.5–5.1)
Sodium: 140 mmol/L (ref 135–145)

## 2015-07-16 LAB — D-DIMER, QUANTITATIVE: D-Dimer, Quant: 4.86 ug/mL-FEU — ABNORMAL HIGH (ref 0.00–0.50)

## 2015-07-16 LAB — BRAIN NATRIURETIC PEPTIDE: B Natriuretic Peptide: 960.9 pg/mL — ABNORMAL HIGH (ref 0.0–100.0)

## 2015-07-16 MED ORDER — POTASSIUM CHLORIDE CRYS ER 20 MEQ PO TBCR
40.0000 meq | EXTENDED_RELEASE_TABLET | Freq: Once | ORAL | Status: AC
  Administered 2015-07-16: 40 meq via ORAL
  Filled 2015-07-16: qty 2

## 2015-07-16 MED ORDER — IOPAMIDOL (ISOVUE-370) INJECTION 76%
INTRAVENOUS | Status: AC
  Administered 2015-07-16: 100 mL via INTRAVENOUS
  Filled 2015-07-16: qty 100

## 2015-07-16 NOTE — Progress Notes (Signed)
Patient ID: Gregory Wiley, male   DOB: 07-03-1950, 65 y.o.   MRN: [id number]   PROGRESS NOTE    Gregory Wiley  [id number] DOB: 12-May-1950 DOA: 07/15/2015  PCP: [name], MD   Outpatient Specialists:   Brief Narrative:  65 y.o. male with known CAD, HTN, HLD, chronic adrenal insufficiency, PE on Xarelto, CVA, OSA on CPAP presents to [facility] ED with main concern of sudden onset of squeezing type of chest pain that radiated to the right jaw and occasionally to the right scapular region, associated with dyspnea, relieved by nitro drip.   Assessment & Plan:   Active Problems:   Chest pain - currently no chest pain - troponins unremarkable  - D-dimer elevated, proceed with CT chest angio to rule out PE - cardiology consulted and assistance is appreciated - may need nuclear stress test tomorrow     CAD, RCA PCI '09, 10/11, CABG X 4 5/12    Hypokalemia - continue to supplement and repeat BMP in AM    Chronic combined systolic and diastolic congestive heart failure ([place]) - currently euvolemic - weight 94 kg --> 93 kg this AM - daily weights, strict I/O    OSA (obstructive sleep apnea) - CPAP at night     Acute tonsillitis (on recent admission)   Bacteremia due to Gram-positive bacteria - Tonsillitis diagnosed during last hospitalization on 4/16- 4/22 - Coag negative staph bacteremia diagnosed during last hospitalization on 4/16-4/22 - PICC line in place to receive vancomycin, pt to take for total two weeks (startig 0000000)    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)   Hemiplegia affecting left dominant side ([place]) - on Xarelto   DVT prophylaxis: On Xarelto (recently restarted 4/26) Code Status: Full  Family Communication: Patient at bedside  Disposition Plan: Home after cardiac work up completed   Consultants:   Cardiology   Procedures:   None   Antimicrobials:   None    Subjective: Pt reports feeling better, denies chest pain or headaches    Objective: Filed Vitals:   07/15/15 2000 07/15/15 2356 07/16/15 0400 07/16/15 0757  BP: 101/60 119/76 125/64 128/80  Pulse: 83 67 65 78  Temp: 98.8 F (37.1 C)  97.7 F (36.5 C) 98 F (36.7 C)  TempSrc:    Oral  Resp: 26 27 23 22   Weight:   93.033 kg (205 lb 1.6 oz)   SpO2: 99% 95% 92%     Intake/Output Summary (Last 24 hours) at 07/16/15 1104 Last data filed at 07/16/15 0400  Gross per 24 hour  Intake    180 ml  Output   1825 ml  Net  -1645 ml   Filed Weights   07/15/15 0600 07/16/15 0400  Weight: 94.031 kg (207 lb 4.8 oz) 93.033 kg (205 lb 1.6 oz)    Examination:  General exam: Appears calm and comfortable  Respiratory system: Clear to auscultation. Respiratory effort normal. Diminished breath sounds at bases  Cardiovascular system: S1 & S2 heard, Regular rhythm, bradycardic.  Gastrointestinal system: Abdomen is nondistended, soft and nontender.  Central nervous system: Alert and oriented. No focal neurological deficits. Extremities: L hemiparesis (residular from R MCA stroke) Psychiatry: Judgement and insight appear normal. Mood & affect appropriate.   Data Reviewed: I have personally reviewed following labs and imaging studies  CBC:  Recent Labs Lab 07/15/15 0150 07/16/15 0537  WBC 9.3 7.2  HGB 9.1* 8.9*  HCT 29.8* 29.4*  MCV 83.0 83.8  PLT 227 205  Basic Metabolic Panel:  Recent Labs Lab 07/15/15 0150 07/16/15 0537  NA 141 140  K 3.0* 3.1*  CL 110 109  CO2 23 25  GLUCOSE 106* 99  BUN 7 <5*  CREATININE 0.69 0.71  CALCIUM 8.0* 7.9*   Coagulation Profile:  Recent Labs Lab 07/15/15 0150  INR 2.55*   Urine analysis:    Component Value Date/Time   COLORURINE AMBER* 07/01/2015 2010   APPEARANCEUR CLEAR 07/01/2015 2010   LABSPEC 1.030 07/01/2015 2010   PHURINE 6.0 07/01/2015 2010   GLUCOSEU NEGATIVE 07/01/2015 2010   [place] NEGATIVE 07/01/2015 2010   BILIRUBINUR SMALL* 07/01/2015 2010   [place] NEGATIVE 07/01/2015 2010   PROTEINUR  NEGATIVE 07/01/2015 2010   UROBILINOGEN 0.2 12/25/2014 2016   NITRITE NEGATIVE 07/01/2015 2010   LEUKOCYTESUR NEGATIVE 07/01/2015 2010    Radiology Studies: Dg Chest 2 View 07/15/2015  Peribronchial thickening noted. Mildly increased interstitial markings may reflect minimal interstitial edema.   Scheduled Meds: . amoxicillin  500 mg Oral Q12H  . aspirin EC  81 mg Oral Daily  . ferrous sulfate  325 mg Oral TID WC  . fludrocortisone  0.1 mg Oral BID  . loratadine  10 mg Oral Daily  . midodrine  2.5 mg Oral TID WC  . pantoprazole  40 mg Oral BID  . potassium chloride  20 mEq Oral TID  . rivaroxaban  20 mg Oral Q supper  . rosuvastatin  40 mg Oral QHS  . senna  1 tablet Oral BID  . senna-docusate  1 tablet Oral BID  . sertraline  50 mg Oral Daily  . vancomycin (VANCOCIN) 1250 mg IVPB  1,250 mg Intravenous Q12H   Continuous Infusions:  Time spent: 20 minutes   Faye Ramsay, MD [facility] Pager [phone]  If 7PM-7AM, please contact night-coverage [email or web address] Password [email or web address] 07/16/2015, 11:04 AM

## 2015-07-16 NOTE — Progress Notes (Signed)
SUBJECTIVE:  No complaints  OBJECTIVE:   Vitals:   Filed Vitals:   07/15/15 2000 07/15/15 2356 07/16/15 0400 07/16/15 0757  BP: 101/60 119/76 125/64 128/80  Pulse: 83 67 65 78  Temp: 98.8 F (37.1 C)  97.7 F (36.5 C) 98 F (36.7 C)  TempSrc:    Oral  Resp: 26 27 23 22   Weight:   205 lb 1.6 oz (93.033 kg)   SpO2: 99% 95% 92%    I&O's:   Intake/Output Summary (Last 24 hours) at 07/16/15 0810 Last data filed at 07/16/15 0400  Gross per 24 hour  Intake    430 ml  Output   2025 ml  Net  -1595 ml   TELEMETRY: Reviewed telemetry pt in NSR:     PHYSICAL EXAM General: Well developed, well nourished, in no acute distress Head: Eyes PERRLA, No xanthomas.   Normal cephalic and atramatic  Lungs:   Clear bilaterally to auscultation and percussion. Heart:   HRRR S1 S2 Pulses are 2+ & equal. Abdomen: Bowel sounds are positive, abdomen soft and non-tender without masses Extremities:   No clubbing, cyanosis or edema.  DP +1 Neuro: Alert and oriented X 3. Psych:  Good affect, responds appropriately   LABS: Basic Metabolic Panel:  Recent Labs  07/15/15 0150 07/16/15 0537  NA 141 140  K 3.0* 3.1*  CL 110 109  CO2 23 25  GLUCOSE 106* 99  BUN 7 <5*  CREATININE 0.69 0.71  CALCIUM 8.0* 7.9*   Liver Function Tests: No results for input(s): AST, ALT, ALKPHOS, BILITOT, PROT, ALBUMIN in the last 72 hours. No results for input(s): LIPASE, AMYLASE in the last 72 hours. CBC:  Recent Labs  07/15/15 0150 07/16/15 0537  WBC 9.3 7.2  HGB 9.1* 8.9*  HCT 29.8* 29.4*  MCV 83.0 83.8  PLT 227 205   Cardiac Enzymes:  Recent Labs  07/15/15 1000 07/15/15 1400  TROPONINI 0.03 0.03   BNP: Invalid input(s): POCBNP D-Dimer: No results for input(s): DDIMER in the last 72 hours. Hemoglobin A1C: No results for input(s): HGBA1C in the last 72 hours. Fasting Lipid Panel: No results for input(s): CHOL, HDL, LDLCALC, TRIG, CHOLHDL, LDLDIRECT in the last 72 hours. Thyroid  Function Tests: No results for input(s): TSH, T4TOTAL, T3FREE, THYROIDAB in the last 72 hours.  Invalid input(s): FREET3 Anemia Panel: No results for input(s): VITAMINB12, FOLATE, FERRITIN, TIBC, IRON, RETICCTPCT in the last 72 hours. Coag Panel:   Lab Results  Component Value Date   INR 2.55* 07/15/2015   INR 1.20 07/03/2015   INR 1.14 02/07/2015    RADIOLOGY: Dg Chest 2 View  07/15/2015  CLINICAL DATA:  Acute onset of central squeezing chest pain, radiating to the right jaw. Shortness of breath. Initial encounter. EXAM: CHEST  2 VIEW COMPARISON:  Chest radiograph from 07/01/2015 FINDINGS: The lungs are well-aerated. Peribronchial thickening is noted. Mildly increased interstitial markings may reflect minimal interstitial edema. There is no evidence of pleural effusion or pneumothorax. The heart is borderline normal in size. A loop recorder is noted. The patient is status post median sternotomy. A right PICC is noted ending about the distal SVC. No acute osseous abnormalities are seen. IMPRESSION: Peribronchial thickening noted. Mildly increased interstitial markings may reflect minimal interstitial edema. Electronically Signed   By: Garald Balding M.D.   On: 07/15/2015 02:38   Dg Chest 2 View  07/01/2015  CLINICAL DATA:  Cough and fever EXAM: CHEST  2 VIEW COMPARISON:  04/07/2014 FINDINGS: Cardiomegaly with  negative aortic and hilar contours. Status post CABG. Low volume chest with interstitial crowding. There is no edema, consolidation, effusion, or pneumothorax. Implantable loop recorder. 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 Patient has previous cervical spine CT imaging in 2016. IMPRESSION: Low volume chest without acute finding. Electronically Signed   By: Monte Fantasia M.D.   On: 07/01/2015 20:46   Ct Soft Tissue Neck W Contrast  07/02/2015  CLINICAL DATA:  Initial evaluation for acute left-sided neck swelling with sore throat. EXAM: CT NECK WITH CONTRAST  TECHNIQUE: Multidetector CT imaging of the neck was performed using the standard protocol following the bolus administration of intravenous contrast. CONTRAST:  1 ISOVUE-300 IOPAMIDOL (ISOVUE-300) INJECTION 61% COMPARISON:  None. FINDINGS: Visualized portions of the brain demonstrate no acute process. Encephalomalacia noted within the anterior right temporal pole. Partially visualized globes and orbits demonstrate no acute abnormality. Scattered mucosal thickening within the max O sinuses and ethmoidal air cells. Fluid level within the right sphenoid sinus. Mucosal thickening within the left sphenoid sinus. Mastoid air cells are clear. Middle ear cavities are clear. Salivary glands including the parotid glands and submandibular glands are normal. Oral cavity is unremarkable, although evaluation somewhat limited by streak artifact from dental amalgam. No acute abnormality about the dentition. Palatine tonsils are mildly prominent and hyper enhancing, which may reflect acute tonsillitis. No peritonsillar abscess or other collection. Epiglottis normal. Vallecula clear. No retropharyngeal fluid collection or edema. Remainder of the hypopharynx and supraglottic larynx within normal limits without acute inflammatory changes. True cords grossly normal. Subglottic airway clear. Approximately 2 cm hypodense nodule with internal calcification present within the inferior left thyroid lobe, indeterminate. Thyroid otherwise within normal limits. Enlarged left level 2 lymph nodes measure up to 13 mm. A few of these nodes are somewhat heterogeneous and partially hypodense in appearance. Increased number of left level 2 P and level 5 nodes as compared to the right. These measure up to 15 mm. Increased number of subcentimeter left level 2/3 nodes as well. There is associated inflammatory stranding within the adjacent fat within the is area of adenopathy in the left neck. Finding is indeterminate, and could reflect supper node  adenitis or reactive adenopathy. Nodal malignancy could also have this appearance. No significant adenopathy seen within the right neck. Visualized superior mediastinum within normal limits. Visualized lungs are clear. Normal intravascular enhancement seen throughout the neck. No acute osseous abnormality. No worrisome lytic or blastic osseous lesions. Prominent bridging anterior osteophytes seen throughout the cervical spine. IMPRESSION: 1. Left-sided cervical adenopathy with associated inflammatory stranding as detailed above, indeterminate, but may reflect sequela of suppurative adenitis. Alternatively, these nodes may be reactive in nature. Nodal malignancy could also have this appearance. Clinical followup to resolution is recommended. 2. Question mild enlargement and hyper enhancement of the palatine tonsils, which may reflect acute tonsillitis. Correlation with physical exam and direct visualization recommended. 3. Approximately 2 cm left thyroid nodule, indeterminate. Correlation with dedicated thyroid ultrasound recommended. This could be performed on a nonemergent basis. 4. Prominent anterior bridging osteophytic spurring throughout the cervical spine. 5. Acute on chronic paranasal sinus disease as above. Electronically Signed   By: Jeannine Boga M.D.   On: 07/02/2015 00:13   Ct Abdomen Pelvis W Contrast  07/03/2015  CLINICAL DATA:  Suprapubic abdominal pain since this morning. Nausea. Fever. Cough. EXAM: CT ABDOMEN AND PELVIS WITH CONTRAST TECHNIQUE: Multidetector CT imaging of the abdomen and pelvis was performed using the standard protocol following bolus administration of intravenous contrast.  CONTRAST:  143mL ISOVUE-300 IOPAMIDOL (ISOVUE-300) INJECTION 61% COMPARISON:  12/23/2014 FINDINGS: Motion artifact limits evaluation of lung bases. Postoperative changes in the mediastinum. Mild diffuse fatty infiltration of the liver. Scattered calcified granulomas in the liver and spleen. Fatty  infiltration of the pancreas. Small cysts in the kidneys. No hydronephrosis. Nephrograms are symmetrical. Calcification of aorta without aneurysm. No adrenal gland nodules. Normal caliber IVC. No retroperitoneal lymphadenopathy. Stomach, small bowel, and colon are not abnormally distended. No free air or free fluid in the abdomen. Pelvis: Appendix is normal. Prostate gland is enlarged, measuring 5.8 cm diameter. The mid and inferior portions of the rectus abdominus muscles bilaterally are expanded with infiltration in the fat around the rectus abdominus muscles and in the anterior low pelvis. This suggest rectus muscle hematomas, possibly due to muscle injury or tear. There is pooling of contrast material within the rectus abdominus muscles, more on the left. This may indicate active contrast extravasation. Bladder wall is not thickened. Postoperative changes in the right hip with intra medullary rod and compression bolt fixation. There is lucency around the visualized compression bolt and rod. This could indicate loosening of hardware. Degenerative changes in the spine and hips. Bilateral inguinal hernias containing fat. IMPRESSION: Bilateral inferior rectus abdominus muscle hematomas with soft tissue hematoma in the surrounding fat and anterior pelvic wall. Puddling of contrast material in the inferior rectus muscles bilaterally but greater on the left may indicate active extravasation. Additional incidental findings as discussed in the body of report. These results will be called to the ordering clinician or representative by the Radiologist Assistant, and communication documented in the PACS or zVision Dashboard. Electronically Signed   By: Lucienne Capers M.D.   On: 07/03/2015 01:18   IMPRESSIONS/PLAN: 1. Prolonged chest discomfort with different features from his previous anginal type painin a patient with coronary artery disease and previous submassive PE and recent rectus sheath hematoma. This may  not be angina. He does not have acute changes on his EKG and enzymes initially are negative x 3.  D-Dimer was supposed to be ordered yesterday but was not done so I will order today and if elevated then needs Chest CT angio to rule out PE.  If D-Dimer is negative will proceed with nuclear stress test tomorrow.   2. Coronary artery disease with previous bypass grafting and previous stenting of the marginal branch since bypass grafting.  Continue ASA 3. 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 History of submassive pulmonary embolus in January 2016 - was off anticoagulant therapy due to rectus sheath bleed but now back on Xarelto.  Agree that should give some consideration to changing to Eliquis which has lower bleeding risk profile.  Will leave decision to primary team.   5. Recent rectus sheath hematoma 6. Recent staph bacteremia receiving intravenous antibiotics 7. Chronic systolic heart failure - he appears euvolemic on exam today but chest xray with mild interstitial edema.  Will check BNP.  He is neg neg 1.8L since admit. 8. Anemia 9. Hypercholesterolemia - continue statin 10.  Hypokalemia - replete per primary team   Sueanne Margarita, MD  07/16/2015  8:10 AM

## 2015-07-16 NOTE — Progress Notes (Signed)
Pt voiding very frequently (every 40-60 min), and in small quantities (100cc-200cc).  Dr Doyle Askew notified via text page.  UA done 07/01/2015.  Pt on po amoxicillin and IV Vanc. Pt wife states that normally the pt voids about 5 times in one day.

## 2015-07-16 NOTE — Procedures (Signed)
Pt does not wish to wear our cpap machine and states that his wife is bringing his cpap tomorrow. Will inform RT if anything changes.

## 2015-07-17 ENCOUNTER — Encounter (HOSPITAL_COMMUNITY): Payer: Self-pay | Admitting: General Practice

## 2015-07-17 DIAGNOSIS — I2699 Other pulmonary embolism without acute cor pulmonale: Secondary | ICD-10-CM

## 2015-07-17 DIAGNOSIS — Z7901 Long term (current) use of anticoagulants: Secondary | ICD-10-CM | POA: Diagnosis not present

## 2015-07-17 DIAGNOSIS — I2510 Atherosclerotic heart disease of native coronary artery without angina pectoris: Secondary | ICD-10-CM | POA: Diagnosis not present

## 2015-07-17 DIAGNOSIS — R0789 Other chest pain: Secondary | ICD-10-CM | POA: Diagnosis not present

## 2015-07-17 DIAGNOSIS — I5042 Chronic combined systolic (congestive) and diastolic (congestive) heart failure: Secondary | ICD-10-CM | POA: Diagnosis not present

## 2015-07-17 LAB — CBC
HCT: 29 % — ABNORMAL LOW (ref 39.0–52.0)
Hemoglobin: 9 g/dL — ABNORMAL LOW (ref 13.0–17.0)
MCH: 26.1 pg (ref 26.0–34.0)
MCHC: 31 g/dL (ref 30.0–36.0)
MCV: 84.1 fL (ref 78.0–100.0)
Platelets: 201 10*3/uL (ref 150–400)
RBC: 3.45 MIL/uL — ABNORMAL LOW (ref 4.22–5.81)
RDW: 17.8 % — ABNORMAL HIGH (ref 11.5–15.5)
WBC: 5.9 10*3/uL (ref 4.0–10.5)

## 2015-07-17 LAB — BASIC METABOLIC PANEL
Anion gap: 5 (ref 5–15)
BUN: 6 mg/dL (ref 6–20)
CO2: 25 mmol/L (ref 22–32)
Calcium: 7.9 mg/dL — ABNORMAL LOW (ref 8.9–10.3)
Chloride: 111 mmol/L (ref 101–111)
Creatinine, Ser: 0.73 mg/dL (ref 0.61–1.24)
GFR calc Af Amer: >60 mL/min (ref >60)
GFR calc non Af Amer: >60 mL/min (ref >60)
Glucose, Bld: 93 mg/dL (ref 65–99)
Potassium: 3.7 mmol/L (ref 3.5–5.1)
Sodium: 141 mmol/L (ref 135–145)

## 2015-07-17 MED ORDER — APIXABAN 5 MG PO TABS: 5.0000 mg | ORAL_TABLET | Freq: Two times a day (BID) | ORAL | Status: DC

## 2015-07-17 MED ORDER — APIXABAN 5 MG PO TABS
10.0000 mg | ORAL_TABLET | Freq: Two times a day (BID) | ORAL | Status: DC
  Administered 2015-07-17 – 2015-07-18 (×3): 10 mg via ORAL
  Filled 2015-07-17 (×3): qty 2

## 2015-07-17 NOTE — Progress Notes (Signed)
Patient ID: [name], male   DOB: 14-May-1950, 65 y.o.   MRN: [id number]   PROGRESS NOTE    Gregory Wiley  [id number] DOB: 04-Sep-1950 DOA: 07/15/2015  PCP: Gregory Stain, MD   Outpatient Specialists:   Brief Narrative:  65 y.o. male with known CAD, HTN, HLD, chronic adrenal insufficiency, PE on Xarelto, CVA, OSA on CPAP presents to [facility] ED with main concern of sudden onset of squeezing type of chest pain that radiated to the right jaw and occasionally to the right scapular region, associated with dyspnea, relieved by nitro drip.   Assessment & Plan:   Active Problems:   Chest pain - no chest pain since admission  - troponins unremarkable  - CT chest with bilateral pulmonary thromboembolism without evidence of right heart strain - pt already on Xarelto but per cardiology recommendations, change to Eliquis  - if pt doing well, can d/c home in am - no further indication for cardiac intervention     CAD, RCA PCI '09, 10/11, CABG X 4 5/12 - now on Eliquis     Hypokalemia - supplemented and WNL this AM - BMP in AM    Chronic combined systolic and diastolic congestive heart failure (HCC) - currently euvolemic - weight 94 kg --> 93 kg --> 92 kg this AM - daily weights, strict I/O    OSA (obstructive sleep apnea) - CPAP at night     Acute tonsillitis (on recent admission)   Bacteremia due to Gram-positive bacteria - Tonsillitis diagnosed during last hospitalization on 4/16- 4/22 - Coag negative staph bacteremia diagnosed during last hospitalization on 4/16-4/22 - PICC line in place to receive vancomycin, pt to take for total two weeks (startig 0000000)    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)   Hemiplegia affecting left dominant side ([place]) - changed Xarelto to Eliquis    DVT prophylaxis: On Xarelto (recently restarted 4/26) Code Status: Full  Family Communication: Patient at bedside  Disposition Plan: Home in AM 5/2  Consultants:   Cardiology    Procedures:   None   Antimicrobials:  None   Subjective: Pt reports feeling better, denies chest pain or headaches   Objective: Filed Vitals:   07/16/15 2000 07/17/15 0013 07/17/15 0443 07/17/15 0756  BP: 123/66 108/66 113/65 116/72  Pulse: 66 83  80  Temp: 98.2 F (36.8 C) 98.6 F (37 C) 98.3 F (36.8 C) 98.4 F (36.9 C)  TempSrc:   Oral Oral  Resp: 24 23 18 20   Weight:   92.987 kg (205 lb)   SpO2: 99% 99% 100% 99%    Intake/Output Summary (Last 24 hours) at 07/17/15 1136 Last data filed at 07/17/15 0014  Gross per 24 hour  Intake    940 ml  Output    500 ml  Net    440 ml   Filed Weights   07/15/15 0600 07/16/15 0400 07/17/15 0443  Weight: 94.031 kg (207 lb 4.8 oz) 93.033 kg (205 lb 1.6 oz) 92.987 kg (205 lb)    Examination:  General exam: Appears calm and comfortable  Respiratory system: Clear to auscultation. Respiratory effort normal. Diminished breath sounds at bases  Cardiovascular system: S1 & S2 heard, Regular rhythm, bradycardic.  Gastrointestinal system: Abdomen is nondistended, soft and nontender.  Central nervous system: Alert and oriented. No focal neurological deficits. Extremities: L hemiparesis (residular from R MCA stroke)  Data Reviewed: I have personally reviewed following labs and imaging studies  CBC:  Recent Labs Lab  07/15/15 0150 07/16/15 0537 07/17/15 0445  WBC 9.3 7.2 5.9  HGB 9.1* 8.9* 9.0*  HCT 29.8* 29.4* 29.0*  MCV 83.0 83.8 84.1  PLT 227 205 [id number]   Basic Metabolic Panel:  Recent Labs Lab 07/15/15 0150 07/16/15 0537 07/17/15 0445  NA 141 140 141  K 3.0* 3.1* 3.7  CL 110 109 111  CO2 23 25 25   GLUCOSE 106* 99 93  BUN 7 <5* 6  CREATININE 0.69 0.71 0.73  CALCIUM 8.0* 7.9* 7.9*   Coagulation Profile:  Recent Labs Lab 07/15/15 0150  INR 2.55*   Urine analysis:    Component Value Date/Time   COLORURINE AMBER* 07/01/2015 2010   APPEARANCEUR CLEAR 07/01/2015 2010   LABSPEC 1.030 07/01/2015 2010    PHURINE 6.0 07/01/2015 2010   GLUCOSEU NEGATIVE 07/01/2015 2010   [place] NEGATIVE 07/01/2015 2010   BILIRUBINUR SMALL* 07/01/2015 2010   [place] NEGATIVE 07/01/2015 2010   PROTEINUR NEGATIVE 07/01/2015 2010   UROBILINOGEN 0.2 12/25/2014 2016   NITRITE NEGATIVE 07/01/2015 2010   LEUKOCYTESUR NEGATIVE 07/01/2015 2010    Radiology Studies: Dg Chest 2 View 07/15/2015  Peribronchial thickening noted. Mildly increased interstitial markings may reflect minimal interstitial edema.   Scheduled Meds: . amoxicillin  500 mg Oral Q12H  . apixaban  10 mg Oral BID   Followed by  . [START ON 07/24/2015] apixaban  5 mg Oral BID  . aspirin EC  81 mg Oral Daily  . ferrous sulfate  325 mg Oral TID WC  . fludrocortisone  0.1 mg Oral BID  . loratadine  10 mg Oral Daily  . midodrine  2.5 mg Oral TID WC  . pantoprazole  40 mg Oral BID  . potassium chloride  20 mEq Oral TID  . rosuvastatin  40 mg Oral QHS  . senna  1 tablet Oral BID  . senna-docusate  1 tablet Oral BID  . sertraline  50 mg Oral Daily   Continuous Infusions:  Time spent: 20 minutes   Gregory Ramsay, MD [facility] Pager [phone]  If 7PM-7AM, please contact night-coverage [email or web address] Password [email or web address] 07/17/2015, 11:36 AM

## 2015-07-17 NOTE — Progress Notes (Signed)
ANTICOAGULATION CONSULT NOTE - Initial Consult  Pharmacy Consult:  Eliquis Indication: New PE, hx recent PE  Allergies  Allergen Reactions  . Diphenhydramine Hcl Anaphylaxis  . Adhesive [Tape] Other (See Comments)    Tears skin - please use paper tape    Patient Measurements: Weight: 205 lb (92.987 kg)  Vital Signs: Temp: 98.4 F (36.9 C) (05/01 0756) Temp Source: Oral (05/01 0756) BP: 116/72 mmHg (05/01 0756) Pulse Rate: 80 (05/01 0756)  Labs:  Recent Labs  07/15/15 0150 07/15/15 1000 07/15/15 1400 07/16/15 0537 07/17/15 0445  HGB 9.1*  --   --  8.9* 9.0*  HCT 29.8*  --   --  29.4* 29.0*  PLT 227  --   --  205 201  LABPROT 27.1*  --   --   --   --   INR 2.55*  --   --   --   --   CREATININE 0.69  --   --  0.71 0.73  TROPONINI  --  0.03 0.03  --   --     Estimated Creatinine Clearance: 104 mL/min (by C-G formula based on Cr of 0.73).   Medical History: Past Medical History  Diagnosis Date  . Coronary artery disease     stent, then CABG 07/20/10  . Hypertension   . Hyperlipidemia   . Obesity (BMI 30-39.9)   . H/O cardiomyopathy     ischemic, now last echo 07/30/11, EF 38%W  . Diverticulosis   . Internal hemorrhoids   . Tubular adenoma of colon   . Plantar fasciitis of left foot   . Basal cell carcinoma of skin of left ankle   . NSTEMI (non-ST elevated myocardial infarction) ([place])     NSTEMI- last cath 06/2011-Stent to LCX-DES, last nuc 07/30/11 low risk  . Pulmonary embolism ([place]) 03/2014  . GERD (gastroesophageal reflux disease)   . Bleeding stomach ulcer 2015  . Daily headache     "for the past month" (11/11/2014)  . Stroke ([place]) 01/2014    "no control of LUE, can slightly LLE; memory problems since" (11/11/2014)  . Gout   . Depression   . Pneumonia     hosp. 12/2014  . Anxiety   . History of hiatal hernia   . OSA on CPAP     since July 2013- uses CPAP sometimes , states he has lost 147 lbs. since stroke & doesn't use the CPAP as much as he use to.    . Adrenal insufficiency ([place])   . Left hemiplegia (HCC)       Assessment: 61 YOM with history of recent bilateral PE on Xarelto PTA.  Patient was taken off of Xarelto since 07/02/15 due to rectus sheath hematoma.  Xarelto was resumed upon admission and his last dose was yesterday PM.  CTA shows new PE and Pharmacy consulted to switch patient to Eliquis.  Patient's renal function and CBC are stable.  Patient denies any bleeding   Goal of Therapy:  Full anticoagulation    Plan:  - Eliquis 10mg  PO BID x 7 days, then on 07/24/15 start 5mg  PO BID - Pharmacy will sign off and follow peripherally.  Thank you for the consult!    Lisseth Brazeau D. Mina Marble, PharmD, BCPS Pager:  [phone] 07/17/2015, 9:55 AM

## 2015-07-17 NOTE — Telephone Encounter (Signed)
Patient back in hospital with bilateral PE.  Will discuss with Dr. Claiborne Billings and monitor when patient discharged

## 2015-07-17 NOTE — Progress Notes (Signed)
[facility]  Patient Status: Active pt with [facility] up to time of this readmission  [facility] is providing the following services: HHRN and Home Infusion Pharm[facility] for home IV ABX.  [facility] hospital team will follow Mr. Castaldo to support transition home when ordered.   If patient discharges after hours, please call [phone].   Larry Sierras 07/17/2015, 11:27 AM

## 2015-07-17 NOTE — Care Management Note (Addendum)
Case Management Note  Patient Details  Name: Gregory Wiley MRN: [id number] Date of Birth: [birth year]  Subjective/Objective: Pt admitted for Chest Pain. Pt is from home with wife and is active with [facility] for RN. Pt may benefit from HHPT/ Aide once stable. Pt may be completed with IV Antibiotics once stable for d/c.                    Action/Plan: [facility] is aware that pt is hospitalized and will continue care once d/c. Pt will need Eliquis once stable for d/c. Benefits check out for co pay. CM will make pt aware of cost once completed. CM will continue to monitor for additional needs.    Expected Discharge Date:                  Expected Discharge Plan:  [place]  In-House Referral:  NA  Discharge planning Services  CM Consult  Post Acute Care Choice:  Durable Medical Equipment, Home Health, Resumption of Svcs/PTA Provider Choice offered to:  Patient  DME Arranged: N/A  DME Agency: N/A   HH Arranged:  RN,  [place] Agency:  [place]  Status of Service:  Completed, signed off  Medicare Important Message Given:    Date Medicare IM Given:    Medicare IM give by:    Date Additional Medicare IM Given:    Additional Medicare Important Message give by:     If discussed at [place] of Stay Meetings, dates discussed:    Additional Comments: [id number] 07-18-15 Jacqlyn Krauss, RN,BSN [phone] CM did make [facility] aware that pt will only need HHRN at d/c. Pt will not need IV vancomycin at d/c. PICC line Removed. No further needs from CM at this time.     1347 07-17-15 Jacqlyn Krauss, RN,BSN [phone] CM did check for co pay cost:   Pt copay will be $42- prior auth required [phone]  CM will provide pt with 30 day free card. No further needs from CM at this time.    Bethena Roys, RN 07/17/2015, 11:26 AM

## 2015-07-17 NOTE — Progress Notes (Signed)
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 No chest pain or DOE.   Inpatient Medications    . amoxicillin  500 mg Oral Q12H  . apixaban  10 mg Oral BID   Followed by  . [START ON 07/24/2015] apixaban  5 mg Oral BID  . aspirin EC  81 mg Oral Daily  . ferrous sulfate  325 mg Oral TID WC  . fludrocortisone  0.1 mg Oral BID  . loratadine  10 mg Oral Daily  . midodrine  2.5 mg Oral TID WC  . pantoprazole  40 mg Oral BID  . potassium chloride  20 mEq Oral TID  . rosuvastatin  40 mg Oral QHS  . senna  1 tablet Oral BID  . senna-docusate  1 tablet Oral BID  . sertraline  50 mg Oral Daily    Vital Signs    Filed Vitals:   07/17/15 0013 07/17/15 0443 07/17/15 0756 07/17/15 1230  BP: 108/66 113/65 116/72 116/76  Pulse: 83  80 81  Temp: 98.6 F (37 C) 98.3 F (36.8 C) 98.4 F (36.9 C) 98 F (36.7 C)  TempSrc:  Oral Oral Oral  Resp: 23 18 20 19   Weight:  205 lb (92.987 kg)    SpO2: 99% 100% 99% 98%    Intake/Output Summary (Last 24 hours) at 07/17/15 1347 Last data filed at 07/17/15 1200  Gross per 24 hour  Intake    770 ml  Output    700 ml  Net     70 ml   Filed Weights   07/15/15 0600 07/16/15 0400 07/17/15 0443  Weight: 207 lb 4.8 oz (94.031 kg) 205 lb 1.6 oz (93.033 kg) 205 lb (92.987 kg)    Physical Exam    General: Pleasant older male, NAD. Neuro: Alert and oriented X 3. Moves all extremities  spontaneously. Psych: Normal affect. HEENT:  Normal  Neck: Supple without bruits or JVD. Lungs:  Resp regular and unlabored, CTA. Heart: RRR no s3, s4, or murmurs. Abdomen: Soft, non-tender, non-distended, BS + x 4.  Extremities: No clubbing, cyanosis, trace edema. DP/PT/Radials 2+ and equal bilaterally.  Labs    CBC  Recent Labs  07/16/15 0537 07/17/15 0445  WBC 7.2 5.9  HGB 8.9* 9.0*  HCT 29.4* 29.0*  MCV 83.8 84.1  PLT 205 [id number]   Basic Metabolic Panel  Recent Labs  07/16/15 0537 07/17/15 0445  NA 140 141  K 3.1* 3.7  CL 109 111  CO2 25 25  GLUCOSE 99 93  BUN <5* 6  CREATININE 0.71 0.73  CALCIUM 7.9* 7.9*   Cardiac Enzymes  Recent Labs  07/15/15 1000 07/15/15 1400  TROPONINI 0.03 0.03   D-Dimer  Recent Labs  07/16/15 0845  DDIMER 4.86*    Telemetry    SR Rate-70s   ECG    No morning EKG  Radiology    Dg Chest 2 View  07/15/2015  CLINICAL DATA:  Acute onset of central squeezing chest pain, radiating to the right jaw. Shortness of breath. Initial encounter. EXAM: CHEST  2 VIEW COMPARISON:  Chest radiograph from 07/01/2015 FINDINGS: The lungs are well-aerated. Peribronchial thickening is noted. Mildly increased interstitial markings may reflect minimal interstitial edema. There is no evidence of pleural effusion or pneumothorax. The heart is borderline normal in size. A loop recorder is noted. The patient is status post median sternotomy. A right PICC is noted ending about the distal SVC. No acute osseous abnormalities are seen. IMPRESSION: Peribronchial thickening noted. Mildly increased interstitial markings may reflect minimal interstitial edema. Electronically Signed   By: Garald Balding M.D.   On: 07/15/2015 02:38   Ct Angio Chest Pe W/cm &/or Wo Cm  07/16/2015  CLINICAL DATA:  Short of breath.  Chest heaviness. EXAM: CT ANGIOGRAPHY CHEST WITH CONTRAST TECHNIQUE: Multidetector CT imaging of the chest was performed using the standard protocol  during bolus administration of intravenous contrast. Multiplanar CT image reconstructions and MIPs were obtained to evaluate the vascular anatomy. CONTRAST:  50 cc Isovue 370 COMPARISON:  09/14/2014 FINDINGS: The study is positive for acute pulmonary thromboembolism. There is low-density thrombus extending from the main right pulmonary artery into lobar and segmental branches. There is also low-density filling defect within segmental branches of the left lower lobe. See image 46 of series 4. No evidence of right heart strain. Right upper extremity PICC is in place. Tip is at the cavoatrial junction. Three vessel coronary artery calcification. Small bilateral pleural effusions. No pneumothorax Patchy pulmonary opacities are present in the right upper lobe and left upper lobe. Hazy ground-glass in the lingula and dependent left lower lobe. No vertebral compression deformity. Review of the MIP images confirms the above findings. IMPRESSION: The study is positive for bilateral pulmonary thromboembolism without evidence of right heart strain. Critical Value/emergent results were called by telephone at the time of interpretation on 07/16/2015 at 12:12 pm to Dr. Valente David, who verbally acknowledged these results. Bilateral pleural effusions. Bilateral pulmonary opacities as described. Pulmonary infarcts are not excluded. Electronically Signed   By: Marybelle Killings M.D.   On: 07/16/2015 12:12   Assessment & Plan    1.Chest pain: Presented with prolonged chest discomfort with features different from previous anginal/ submassive PE pain. Did not have any acute EKG changes on his EKG and cardiac enzymes were negative x3. A D-dimer was ordered by Dr. Radford Pax on 07/16/2015, positive at 4.86 with CTA ordered showing  Bilateral PE without evidence of right heart strain. -Was changed from Xarelto to Eliquis given its lower bleeding risk profile and his hx of rectus sheath bleed.   2. Coronary artery disease: with previous bypass  grafting and previous stenting of the marginal branch since bypass grafting.  -Continue ASA  3. 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 History of submassive pulmonary embolus in January 2016 - was off anticoagulant therapy due to rectus sheath bleed, was placed on Xarelto initially but was changed to Eliquis given its lower bleeding risk profile.  5. Recent rectus sheath hematoma  6. Recent staph bacteremia: received intravenous antibiotics  7. Chronic systolic heart failure: - he appears euvolemic on exam today but chest xray with mild interstitial edema. He is neg neg 1.8L since admit. BNP was 960. Would add daily low dose lasix.    8. Anemia: remains stable around baseline of 9  9. Hypercholesterolemia:- continue statin  10. Hypokalemia: -  replete per primary team  Signed, Reino Bellis NP-C Pager [phone]  Patient seen and examined. Agree with assessment and plan. Breathing much better today.  No chest pain.  No dyspnea. Bilateral PE confirmed by CT imaging without RV strain.  Now on Eliquis at PE dosing 10 mg twice a day for 1 week and then will transition to 5 mg twice a day.  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   [name], MD, [facility] 07/17/2015 6:47 PM

## 2015-07-17 NOTE — Care Management Obs Status (Signed)
[facility] NOTIFICATION   Patient Details  Name: Gregory Wiley MRN: [id number] Date of Birth: May 11, 1950   Medicare Observation Status Notification Given:  Yes    Bethena Roys, RN 07/17/2015, 1:30 PM

## 2015-07-17 NOTE — Discharge Instructions (Signed)
Information on my medicine - ELIQUIS (apixaban)  This medication education was reviewed with me or my healthcare representative as part of my discharge preparation.  The pharmacist that spoke with me during my hospital stay was:  Saundra Shelling, [facility]  Why was Eliquis prescribed for you? Eliquis was prescribed to treat blood clots that may have been found in the veins of your legs (deep vein thrombosis) or in your lungs (pulmonary embolism) and to reduce the risk of them occurring again.  What do You need to know about Eliquis ? The starting dose is 10 mg (two 5 mg tablets) taken TWICE daily for the FIRST SEVEN (7) DAYS, then on (enter date)  07/24/15  the dose is reduced to ONE 5 mg tablet taken TWICE daily.  Eliquis may be taken with or without food.   Try to take the dose about the same time in the morning and in the evening. If you have difficulty swallowing the tablet whole please discuss with your pharmacist how to take the medication safely.  Take Eliquis exactly as prescribed and DO NOT stop taking Eliquis without talking to the doctor who prescribed the medication.  Stopping may increase your risk of developing a new blood clot.  Refill your prescription before you run out.  After discharge, you should have regular check-up appointments with your healthcare provider that is prescribing your Eliquis.    What do you do if you miss a dose? If a dose of ELIQUIS is not taken at the scheduled time, take it as soon as possible on the same day and twice-daily administration should be resumed. The dose should not be doubled to make up for a missed dose.  Important Safety Information A possible side effect of Eliquis is bleeding. You should call your healthcare provider right away if you experience any of the following: ? Bleeding from an injury or your nose that does not stop. ? Unusual colored urine (red or dark brown) or unusual colored stools (red or black). ? Unusual bruising for  unknown reasons. ? A serious fall or if you hit your head (even if there is no bleeding).  Some medicines may interact with Eliquis and might increase your risk of bleeding or clotting while on Eliquis. To help avoid this, consult your healthcare provider or pharmacist prior to using any new prescription or non-prescription medications, including herbals, vitamins, non-steroidal anti-inflammatory drugs (NSAIDs) and supplements.  This website has more information on Eliquis (apixaban): http://[email or web address].com/eliquis/home

## 2015-07-18 DIAGNOSIS — R0789 Other chest pain: Secondary | ICD-10-CM | POA: Diagnosis not present

## 2015-07-18 DIAGNOSIS — I2699 Other pulmonary embolism without acute cor pulmonale: Secondary | ICD-10-CM | POA: Diagnosis not present

## 2015-07-18 DIAGNOSIS — I2510 Atherosclerotic heart disease of native coronary artery without angina pectoris: Secondary | ICD-10-CM | POA: Diagnosis not present

## 2015-07-18 DIAGNOSIS — Z7901 Long term (current) use of anticoagulants: Secondary | ICD-10-CM | POA: Diagnosis not present

## 2015-07-18 LAB — CBC
HCT: 27.6 % — ABNORMAL LOW (ref 39.0–52.0)
Hemoglobin: 8.5 g/dL — ABNORMAL LOW (ref 13.0–17.0)
MCH: 26 pg (ref 26.0–34.0)
MCHC: 30.8 g/dL (ref 30.0–36.0)
MCV: 84.4 fL (ref 78.0–100.0)
Platelets: 205 10*3/uL (ref 150–400)
RBC: 3.27 MIL/uL — ABNORMAL LOW (ref 4.22–5.81)
RDW: 18.1 % — ABNORMAL HIGH (ref 11.5–15.5)
WBC: 5.7 10*3/uL (ref 4.0–10.5)

## 2015-07-18 LAB — BASIC METABOLIC PANEL
Anion gap: 6 (ref 5–15)
BUN: 11 mg/dL (ref 6–20)
CO2: 24 mmol/L (ref 22–32)
Calcium: 7.9 mg/dL — ABNORMAL LOW (ref 8.9–10.3)
Chloride: 110 mmol/L (ref 101–111)
Creatinine, Ser: 0.83 mg/dL (ref 0.61–1.24)
GFR calc Af Amer: >60 mL/min (ref >60)
GFR calc non Af Amer: >60 mL/min (ref >60)
Glucose, Bld: 94 mg/dL (ref 65–99)
Potassium: 3.7 mmol/L (ref 3.5–5.1)
Sodium: 140 mmol/L (ref 135–145)

## 2015-07-18 MED ORDER — FUROSEMIDE 20 MG PO TABS: 20.0000 mg | ORAL_TABLET | Freq: Every day | ORAL | Status: DC

## 2015-07-18 MED ORDER — APIXABAN 5 MG PO TABS: 5.0000 mg | ORAL_TABLET | Freq: Two times a day (BID) | ORAL | Status: DC

## 2015-07-18 MED ORDER — APIXABAN 5 MG PO TABS
10.0000 mg | ORAL_TABLET | Freq: Two times a day (BID) | ORAL | Status: DC
Start: 2015-07-18 — End: 2015-07-24

## 2015-07-18 MED ORDER — HYDROCODONE-ACETAMINOPHEN 5-325 MG PO TABS: 1.0000 | ORAL_TABLET | Freq: Four times a day (QID) | ORAL | Status: DC | PRN

## 2015-07-18 MED ORDER — ALPRAZOLAM 0.25 MG PO TABS: 0.1250 mg | ORAL_TABLET | Freq: Three times a day (TID) | ORAL | Status: DC | PRN

## 2015-07-18 MED ORDER — FUROSEMIDE 20 MG PO TABS
20.0000 mg | ORAL_TABLET | Freq: Every day | ORAL | Status: DC
  Administered 2015-07-18: 20 mg via ORAL
  Filled 2015-07-18: qty 1

## 2015-07-18 NOTE — Telephone Encounter (Signed)
Patient in hospital now.

## 2015-07-18 NOTE — Progress Notes (Signed)
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 No chest pain or DOE.   Inpatient Medications    . apixaban  10 mg Oral BID   Followed by  . [START ON 07/24/2015] apixaban  5 mg Oral BID  . aspirin EC  81 mg Oral Daily  . ferrous sulfate  325 mg Oral TID WC  . fludrocortisone  0.1 mg Oral BID  . loratadine  10 mg Oral Daily  . midodrine  2.5 mg Oral TID WC  . pantoprazole  40 mg Oral BID  . potassium chloride  20 mEq Oral TID  . rosuvastatin  40 mg Oral QHS  . senna  1 tablet Oral BID  . senna-docusate  1 tablet Oral BID  . sertraline  50 mg Oral Daily    Vital Signs    Filed Vitals:   07/17/15 2014 07/17/15 2033 07/18/15 0600 07/18/15 0800  BP: 119/70  113/61   Pulse: 78 81    Temp: 98.4 F (36.9 C)  98.3 F (36.8 C)   TempSrc: Oral  Oral   Resp: 18 18 20    Weight:   205 lb 14.4 oz (93.396 kg)   SpO2: 97% 96% 96% 100%    Intake/Output Summary (Last 24 hours) at 07/18/15 1104 Last data filed at 07/18/15 0933  Gross per 24 hour  Intake    920 ml  Output    525 ml  Net    395 ml   Filed Weights   07/16/15 0400 07/17/15 0443 07/18/15 0600  Weight: 205 lb 1.6 oz (93.033 kg) 205 lb (92.987 kg) 205 lb 14.4 oz (93.396 kg)    Physical Exam    General: Pleasant older male, NAD. Neuro: Alert and oriented X 3. Moves all extremities spontaneously. Psych: Normal affect. HEENT:  Normal  Neck: Supple without bruits or  JVD. Lungs:  Resp regular and unlabored, CTA. Heart: RRR no s3, s4, or murmurs. Abdomen: Soft, non-tender, non-distended, BS + x 4.  Extremities: No clubbing, cyanosis, trace edema. DP/PT/Radials 2+ and equal bilaterally.  Labs    CBC  Recent Labs  07/17/15 0445 07/18/15 0637  WBC 5.9 5.7  HGB 9.0* 8.5*  HCT 29.0* 27.6*  MCV 84.1 84.4  PLT 201 [id number]   Basic Metabolic Panel  Recent Labs  07/17/15 0445 07/18/15 0637  NA 141 140  K 3.7 3.7  CL 111 110  CO2 25 24  GLUCOSE 93 94  BUN 6 11  CREATININE 0.73 0.83  CALCIUM 7.9* 7.9*   Cardiac Enzymes  Recent Labs  07/15/15 1400  TROPONINI 0.03   D-Dimer  Recent Labs  07/16/15 0845  DDIMER 4.86*    Telemetry    SR Rate-70s   ECG    No morning EKG  Radiology     Assessment & Plan    1.Chest pain: Presented with prolonged chest discomfort with features different from previous anginal/ submassive PE  pain. Did not have any acute EKG changes on his EKG and cardiac enzymes were negative x3. A D-dimer was ordered by Dr. Radford Pax on 07/16/2015, positive at 4.86 with CTA ordered showing  Bilateral PE without evidence of right heart strain. -Was changed from Xarelto to Eliquis given its lower bleeding risk profile and his hx of rectus sheath bleed.  -Eliquis dosing 10mg  BID x1 week and then transition to 5mg  BID -Will arrange F/u appt  2. Coronary artery disease: with previous bypass grafting and previous stenting of the marginal branch since bypass grafting.  -Continue ASA  3. 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 History of submassive pulmonary embolus in January 2016 - was off anticoagulant therapy due to rectus sheath bleed, was placed on Xarelto initially but was changed to Eliquis given its lower bleeding risk profile.  5. Recent rectus sheath hematoma  6. Recent staph bacteremia: received intravenous antibiotics  7. Chronic systolic heart failure:  - he appears euvolemic on exam today but chest xray with mild interstitial edema. He is net neg 575 since admit. BNP was 960. Will add daily low dose lasix 20mg . On home potassium. F/u BMET in 2 weeks to check electrolytes and renal function.   8. Anemia: remains stable around baseline of 9  9. Hypercholesterolemia:- continue statin  Signed, Reino Bellis NP-C Pager [phone]   Patient seen and examined. Agree with assessment and plan. Feels better; no chest pain or shortness  Tolerating PE dosing of Eliquis at 10 mg twice a day for 1 week with transition to 5 mg bid next week.  No abdominal pain or recurrent bleed  Okay to discharge today from cardiology perspective.   [name], MD, [facility] 07/18/2015 4:37 PM

## 2015-07-18 NOTE — Discharge Summary (Signed)
Physician Discharge Summary  Gregory Wiley [id number] DOB: [birth year] DOA: 07/15/2015  PCP: [name], MD  Admit date: 07/15/2015 Discharge date: 07/18/2015  Recommendations for Outpatient Follow-up:  1. Pt will need to follow up with PCP in 1-2 weeks post discharge 2. Please obtain BMP to evaluate electrolytes and kidney function 3. Please also check CBC to evaluate Hg and Hct levels 4. Please note that pt has completed the course of ABX Vancomycin and PICC line was removed piror to d/c 5. Pt also had change in medication, Xarelto stopped and pt transitioned to Eliquis per cardiology team recommendations, please detailed instructions below   Discharge Diagnoses:  Principal Problem:   Chest pain Active Problems:   CAD (coronary artery disease), native coronary artery   Chronic combined systolic and diastolic congestive heart failure ([place])  Discharge Condition: Stable  Diet recommendation: Heart healthy diet discussed in details   Brief Narrative:  65 y.o. male with known CAD, HTN, HLD, chronic adrenal insufficiency, PE on Xarelto, CVA, OSA on CPAP presents to [facility] ED with main concern of sudden onset of squeezing type of chest pain that radiated to the right jaw and occasionally to the right scapular region, associated with dyspnea, relieved by nitro drip.   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 - no chest pain since admission  - troponins unremarkable  - CT chest with bilateral pulmonary thromboembolism without evidence of right heart strain - pt already on Xarelto but per cardiology recommendations we have changed to Eliquis  - no further indication for cardiac intervention    CAD, RCA PCI '09, 10/11, CABG X 4 5/12 - now on Eliquis    Hypokalemia - supplemented prior to discharge    Chronic combined systolic and diastolic congestive heart failure ([place]) - currently euvolemic - weight 94 kg --> 93 kg -->  93 kg this AM - daily weights, strict I/O   OSA (obstructive sleep apnea) - CPAP at night    Acute tonsillitis (on recent admission)  Bacteremia due to Gram-positive bacteria - Tonsillitis diagnosed during last hospitalization on 4/16- 4/22 - Coag negative staph bacteremia diagnosed during last hospitalization on 4/16-4/22 - pt completed course of Vancomycin and PICC line removed    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)  Hemiplegia affecting left dominant side ([place]) - changed Xarelto to Eliquis   DVT prophylaxis: On Xarelto (recently restarted 4/26) Code Status: Full  Family Communication: Patient and wife at bedside  Disposition Plan: Home   Consultants:   Cardiology  Procedures:   None  Antimicrobials:  None  Discharge Exam: Filed Vitals:   07/17/15 2033 07/18/15 0600  BP:  113/61  Pulse: 81   Temp:  98.3 F (36.8 C)  Resp: 18 20   Filed Vitals:   07/17/15 1430 07/17/15 2014 07/17/15 2033 07/18/15 0600  BP: 113/77 119/70  113/61  Pulse: 88 78 81   Temp: 98.3 F (36.8 C) 98.4 F (36.9 C)  98.3 F (36.8 C)  TempSrc: Oral Oral  Oral  Resp: 18 18 18 20   Weight:    93.396 kg (205 lb 14.4 oz)  SpO2: 97% 97% 96% 96%    General: Pt is alert, follows commands appropriately, not in acute distress Cardiovascular: Regular rate and rhythm, S1/S2 +, no murmurs, no rubs, no gallops Respiratory: Clear to auscultation bilaterally, no wheezing, no crackles, no rhonchi Abdominal: Soft, non tender, non distended, bowel sounds +, no guarding  Discharge Instructions  Discharge Instructions    Diet - low sodium heart healthy    Complete by:  As directed      Increase activity slowly    Complete by:  As directed             Medication List    STOP taking these medications        amoxicillin 500 MG capsule  Commonly known as:  AMOXIL     methylPREDNISolone 4 MG Tbpk tablet  Commonly known as:  MEDROL DOSEPAK     rivaroxaban 10  MG Tabs tablet  Commonly known as:  XARELTO     vancomycin 1,250 mg in sodium chloride 0.9 % 250 mL      TAKE these medications        acetaminophen 325 MG tablet  Commonly known as:  TYLENOL  Take 1 tablet (325 mg total) by mouth every 6 (six) hours as needed for mild pain.     ALLEGRA PO  Take 1 tablet by mouth daily as needed (seasonal allergies).     ALPRAZolam 0.25 MG tablet  Commonly known as:  XANAX  Take 0.5 tablets (0.125 mg total) by mouth every 8 (eight) hours as needed for anxiety.     apixaban 5 MG Tabs tablet  Commonly known as:  ELIQUIS  Take 2 tablets (10 mg total) by mouth 2 (two) times daily. Continue taking until May 7th, 2017     apixaban 5 MG Tabs tablet  Commonly known as:  ELIQUIS  Take 1 tablet (5 mg total) by mouth 2 (two) times daily. Start taking May 8th, 2017.  Start taking on:  07/24/2015     aspirin EC 81 MG tablet  Take 1 tablet (81 mg total) by mouth daily.     colchicine 0.6 MG tablet  Take 0.6 mg by mouth daily as needed (for gout). Reported on 05/26/2015     ferrous sulfate 325 (65 FE) MG tablet  Take 1 tablet (325 mg total) by mouth 3 (three) times daily with meals.     fludrocortisone 0.1 MG tablet  Commonly known as:  FLORINEF  Take 1 tablet (0.1 mg total) by mouth 2 (two) times daily.     fluticasone 50 MCG/ACT nasal spray  Commonly known as:  FLONASE  Place 2 sprays into both nostrils at bedtime as needed (seasonal allergies).     HYDROcodone-acetaminophen 5-325 MG tablet  Commonly known as:  NORCO/VICODIN  Take 1 tablet by mouth every 6 (six) hours as needed for moderate pain.     hydrocortisone 25 MG suppository  Commonly known as:  ANUSOL-HC  Place 25 mg rectally as needed for hemorrhoids or itching.     Melatonin 10 MG Caps  Take 10 mg by mouth at bedtime as needed (sleep).     midodrine 2.5 MG tablet  Commonly known as:  PROAMATINE  Take 1 tablet (2.5 mg total) by mouth 3 (three) times daily with meals.      nitroGLYCERIN 0.4 MG SL tablet  Commonly known as:  NITROSTAT  Place 1 tablet (0.4 mg total) under the tongue every 5 (five) minutes as needed. For chest pain     pantoprazole 40 MG tablet  Commonly known as:  PROTONIX  Take 1 tablet (40 mg total) by mouth 2 (two) times daily.     potassium chloride 10 MEQ tablet  Commonly known as:  K-DUR  Take 2 tablets (20 mEq total) by mouth 3 (three) times daily.  PRESCRIPTION MEDICATION  Inhale into the lungs at bedtime. CPAP     rosuvastatin 40 MG tablet  Commonly known as:  CRESTOR  Take 1 tablet (40 mg total) by mouth daily.     senna 8.6 MG Tabs tablet  Commonly known as:  SENOKOT  Take 1 tablet by mouth 2 (two) times daily.     SENNA S 8.6-50 MG tablet  Generic drug:  senna-docusate  Take 1 tablet by mouth 2 (two) times daily.     sertraline 50 MG tablet  Commonly known as:  ZOLOFT  Take 1 tablet (50 mg total) by mouth daily.     tiZANidine 4 MG tablet  Commonly known as:  ZANAFLEX  Take 4 mg by mouth 2 (two) times daily as needed for muscle spasms.           Follow-up Information    Follow up with [name], MD.   Specialty:  [facility]   Contact information:   [place] [address] [phone]       Call Faye Ramsay, MD.   Specialty:  Internal Medicine   Why:  As needed call my cell phone [phone]   Contact information:   [address] [place] [address] [phone]        The results of significant diagnostics from this hospitalization (including imaging, microbiology, ancillary and laboratory) are listed below for reference.     Microbiology: No results found for this or any previous visit (from the past 240 hour(s)).   Labs: Basic Metabolic Panel:  Recent Labs Lab 07/15/15 0150 07/16/15 0537 07/17/15 0445 07/18/15 0637  NA 141 140 141 140  K 3.0* 3.1* 3.7 3.7  CL 110 109 111 110  CO2 23 25 25 24   GLUCOSE 106* 99 93 94   BUN 7 <5* 6 11  CREATININE 0.69 0.71 0.73 0.83  CALCIUM 8.0* 7.9* 7.9* 7.9*   CBC:  Recent Labs Lab 07/15/15 0150 07/16/15 0537 07/17/15 0445 07/18/15 0637  WBC 9.3 7.2 5.9 5.7  HGB 9.1* 8.9* 9.0* 8.5*  HCT 29.8* 29.4* 29.0* 27.6*  MCV 83.0 83.8 84.1 84.4  PLT 227 205 201 205   Cardiac Enzymes:  Recent Labs Lab 07/15/15 1000 07/15/15 1400  TROPONINI 0.03 0.03   BNP: BNP (last 3 results)  Recent Labs  07/16/15 0845  BNP 960.9*    SIGNED: Time coordinating discharge: 30 minutes  Faye Ramsay, MD  [facility] 07/18/2015, 9:59 AM Pager [phone]  If 7PM-7AM, please contact night-coverage [email or web address] Password [email or web address]

## 2015-07-19 ENCOUNTER — Telehealth: Payer: Self-pay

## 2015-07-19 ENCOUNTER — Other Ambulatory Visit: Payer: Medicare Other

## 2015-07-19 NOTE — Telephone Encounter (Signed)
Transition Care Management Follow-up Telephone Call     Date discharged? 07/18/2015         How have you been since you were released from the hospital? Status improving   Any patient concerns? Questions about labs (addressed by PCP)   Do you understand why you were in the hospital? Yes   Do you understand the discharge instructions? Yes   Where were you discharged to? Home   Items Reviewed:  Medications reviewed: Yes  Allergies reviewed: Yes  Dietary changes reviewed: Yes  Referrals reviewed: N/A   Functional Questionnaire:  Independent - I Dependent - D    Activities of Daily Living (ADLs):    Personal hygiene - wife helps with bathing as needed; pt has shower chair Dressing - wife helps with dressing as needed Eating - I Maintaining continence - I Transferring - ambulates with a cane  Independent Activities of Daily Living (ADLs): Basic communication skills - I Transportation - wife drives as needed Meal preparation - wife prepares meals Shopping - wife does shopping Housework - wife does housework  Managing medications - wife helps with medications as needed Managing personal finances - wife helps with finances as needed   Confirmed importance and date/time of follow-up visits scheduled YES  Provider Appointment booked with PCP on 07/24/15  Confirmed with patient if condition begins to worsen call PCP or go to the ER.  Patient was given the office number and encouraged to call back with question or concerns: YES

## 2015-07-20 ENCOUNTER — Encounter: Payer: Self-pay | Admitting: Family Medicine

## 2015-07-21 ENCOUNTER — Telehealth: Payer: Self-pay | Admitting: Family Medicine

## 2015-07-21 ENCOUNTER — Encounter: Payer: Self-pay | Admitting: Cardiology

## 2015-07-21 NOTE — Telephone Encounter (Signed)
Greg[facility] called Pt was in [facility] 4/22-5/2 for observation Pt spouse refused home health since his pic line was taken out and iv antibiotic was discontinue

## 2015-07-21 NOTE — Telephone Encounter (Signed)
Noted. Thanks.

## 2015-07-22 NOTE — Telephone Encounter (Signed)
See hospital dc; pt was changed to eliquis

## 2015-07-24 ENCOUNTER — Encounter: Payer: Self-pay | Admitting: Family Medicine

## 2015-07-24 ENCOUNTER — Ambulatory Visit (INDEPENDENT_AMBULATORY_CARE_PROVIDER_SITE_OTHER): Payer: Medicare Other | Admitting: Family Medicine

## 2015-07-24 VITALS — BP 118/68 | HR 99 | Temp 97.8°F | Wt 196.0 lb

## 2015-07-24 DIAGNOSIS — I69952 Hemiplegia and hemiparesis following unspecified cerebrovascular disease affecting left dominant side: Secondary | ICD-10-CM

## 2015-07-24 DIAGNOSIS — G8192 Hemiplegia, unspecified affecting left dominant side: Secondary | ICD-10-CM

## 2015-07-24 DIAGNOSIS — I2699 Other pulmonary embolism without acute cor pulmonale: Secondary | ICD-10-CM | POA: Diagnosis not present

## 2015-07-24 DIAGNOSIS — Z8673 Personal history of transient ischemic attack (TIA), and cerebral infarction without residual deficits: Secondary | ICD-10-CM

## 2015-07-24 LAB — COMPREHENSIVE METABOLIC PANEL
ALT: 23 U/L (ref 0–53)
AST: 21 U/L (ref 0–37)
Albumin: 3.5 g/dL (ref 3.5–5.2)
Alkaline Phosphatase: 53 U/L (ref 39–117)
BUN: 14 mg/dL (ref 6–23)
CO2: 28 mEq/L (ref 19–32)
Calcium: 9 mg/dL (ref 8.4–10.5)
Chloride: 105 mEq/L (ref 96–112)
Creatinine, Ser: 0.88 mg/dL (ref 0.40–1.50)
GFR: 92.2 mL/min (ref >60.00)
Glucose, Bld: 110 mg/dL — ABNORMAL HIGH (ref 70–99)
Potassium: 3.4 mEq/L — ABNORMAL LOW (ref 3.5–5.1)
Sodium: 138 mEq/L (ref 135–145)
Total Bilirubin: 0.4 mg/dL (ref 0.2–1.2)
Total Protein: 7.5 g/dL (ref 6.0–8.3)

## 2015-07-24 LAB — CBC WITH DIFFERENTIAL/PLATELET
Basophils Absolute: 0 10*3/uL (ref 0.0–0.1)
Basophils Relative: 0.5 % (ref 0.0–3.0)
Eosinophils Absolute: 0 10*3/uL (ref 0.0–0.7)
Eosinophils Relative: 0.9 % (ref 0.0–5.0)
HCT: 31.6 % — ABNORMAL LOW (ref 39.0–52.0)
Hemoglobin: 10.2 g/dL — ABNORMAL LOW (ref 13.0–17.0)
Lymphocytes Relative: 21.7 % (ref 12.0–46.0)
Lymphs Abs: 1.1 10*3/uL (ref 0.7–4.0)
MCHC: 32.2 g/dL (ref 30.0–36.0)
MCV: 81.3 fl (ref 78.0–100.0)
Monocytes Absolute: 0.3 10*3/uL (ref 0.1–1.0)
Monocytes Relative: 6.5 % (ref 3.0–12.0)
Neutro Abs: 3.7 10*3/uL (ref 1.4–7.7)
Neutrophils Relative %: 70.4 % (ref 43.0–77.0)
Platelets: 234 10*3/uL (ref 150.0–400.0)
RBC: 3.89 Mil/uL — ABNORMAL LOW (ref 4.22–5.81)
RDW: 20.7 % — ABNORMAL HIGH (ref 11.5–15.5)
WBC: 5.3 10*3/uL (ref 4.0–10.5)

## 2015-07-24 NOTE — Patient Instructions (Addendum)
We talked about fall risk reduction today.   Go to the lab on the way out.  We'll contact you with your lab report. I'll check on PT and OT in the meantime.

## 2015-07-24 NOTE — Progress Notes (Signed)
Pre visit review using our clinic review tool, if applicable. No additional management support is needed unless otherwise documented below in the visit note. Admit date: 07/15/2015 Discharge date: 07/18/2015  Recommendations for Outpatient Follow-up:  1. Pt will need to follow up with PCP in 1-2 weeks post discharge 2. Please obtain BMP to evaluate electrolytes and kidney function 3. Please also check CBC to evaluate Hg and Hct levels 4. Please note that pt has completed the course of ABX Vancomycin and PICC line was removed piror to d/c 5. Pt also had change in medication, Xarelto stopped and pt transitioned to Eliquis per cardiology team recommendations, please detailed instructions below  Discharge Diagnoses:  Principal Problem:  Chest pain Active Problems:  CAD (coronary artery disease), native coronary artery  Chronic combined systolic and diastolic congestive heart failure ([place])  Discharge Condition: Stable  Diet recommendation: Heart healthy diet discussed in details   Brief Narrative:  65 y.o. male with known CAD, HTN, HLD, chronic adrenal insufficiency, PE on Xarelto, CVA, OSA on CPAP presents to [facility] ED with main concern of sudden onset of squeezing type of chest pain that radiated to the right jaw and occasionally to the right scapular region, associated with dyspnea, relieved by nitro drip.   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 - no chest pain since admission  - troponins unremarkable  - CT chest with bilateral pulmonary thromboembolism without evidence of right heart strain - pt already on Xarelto but per cardiology recommendations we have changed to Eliquis  - no further indication for cardiac intervention    CAD, RCA PCI '09, 10/11, CABG X 4 5/12 - now on Eliquis    Hypokalemia - supplemented prior to discharge    Chronic combined systolic and diastolic congestive heart failure ([place]) - currently  euvolemic - weight 94 kg --> 93 kg --> 93 kg this AM - daily weights, strict I/O   OSA (obstructive sleep apnea) - CPAP at night    Acute tonsillitis (on recent admission)  Bacteremia due to Gram-positive bacteria - Tonsillitis diagnosed during last hospitalization on 4/16- 4/22 - Coag negative staph bacteremia diagnosed during last hospitalization on 4/16-4/22 - pt completed course of Vancomycin and PICC line removed    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)  Hemiplegia affecting left dominant side ([place]) - changed Xarelto to Eliquis   DVT prophylaxis: On Xarelto (recently restarted 4/26) Code Status: Full  Family Communication: Patient and wife at bedside  Disposition Plan: Home   Consultants:   Cardiology  Procedures:   None  Antimicrobials:  None  Discharge Exam: Filed Vitals:   07/17/15 2033 07/18/15 0600  BP:  113/61  Pulse: 81   Temp:  98.3 F (36.8 C)  Resp: 18 20   Filed Vitals:   07/17/15 1430 07/17/15 2014 07/17/15 2033 07/18/15 0600  BP: 113/77 119/70  113/61  Pulse: 88 78 81   Temp: 98.3 F (36.8 C) 98.4 F (36.9 C)  98.3 F (36.8 C)  TempSrc: Oral Oral  Oral  Resp: 18 18 18 20   Weight:    93.396 kg (205 lb 14.4 oz)  SpO2: 97% 97% 96% 96%    General: Pt is alert, follows commands appropriately, not in acute distress Cardiovascular: Regular rate and rhythm, S1/S2 +, no murmurs, no rubs, no gallops Respiratory: Clear to auscultation bilaterally, no wheezing, no crackles, no rhonchi Abdominal: Soft, non tender, non distended, bowel sounds +, no  guarding   Discharge Instructions  Discharge Instructions    Diet - low sodium heart healthy  Complete by: As directed      Increase activity slowly  Complete by: As directed        The above d/w pt.  Inpatient tx for PE.  No CP, not SOB usually and back on anticoagulation now.    H/o CVA and  now "I feel like I've been drunk for a week."  Lightheaded but not having vertigo sx.  Ongoing for 1.5 years, since the initial CVA but worse recently.  D/w pt that his was likely his initial CVA sx, w/o a new CVA but with strain of recent events his ability to compensate has likely been set back temporarily.   Speech is slower now but not slurred.  He is deconditioned.  Less weight bearing in the meantime.   No fevers.  Sacral irritation healed.  Sleep disrupted.   H/o low K noted.  On lasix recently.   Due for f/u labs.    Fall risk reduction d/w pt.  Using wheelchair, has L leg brace to use.  Using his belt for getting up.  Has hardware at the house to help with mobility.    PMH and SH reviewed  ROS: Per HPI unless specifically indicated in ROS section   Meds, vitals, and allergies reviewed.   GEN: nad, alert and oriented HEENT: mucous membranes moist NECK: supple w/o LA, not ttp CV: rrr. PULM: ctab, no inc wob ABD: soft, +bs, not ttp EXT: trace BLE edema SKIN: no acute rash L side flaccid at baseline

## 2015-07-25 ENCOUNTER — Encounter: Payer: Self-pay | Admitting: Cardiology

## 2015-07-25 ENCOUNTER — Ambulatory Visit (INDEPENDENT_AMBULATORY_CARE_PROVIDER_SITE_OTHER): Payer: Medicare Other | Admitting: Cardiology

## 2015-07-25 VITALS — BP 100/70 | HR 88 | Ht 73.0 in | Wt 189.0 lb

## 2015-07-25 DIAGNOSIS — Z7901 Long term (current) use of anticoagulants: Secondary | ICD-10-CM

## 2015-07-25 DIAGNOSIS — Z951 Presence of aortocoronary bypass graft: Secondary | ICD-10-CM

## 2015-07-25 DIAGNOSIS — I2699 Other pulmonary embolism without acute cor pulmonale: Secondary | ICD-10-CM | POA: Diagnosis not present

## 2015-07-25 DIAGNOSIS — I63411 Cerebral infarction due to embolism of right middle cerebral artery: Secondary | ICD-10-CM

## 2015-07-25 DIAGNOSIS — Z79899 Other long term (current) drug therapy: Secondary | ICD-10-CM | POA: Diagnosis not present

## 2015-07-25 DIAGNOSIS — I5042 Chronic combined systolic (congestive) and diastolic (congestive) heart failure: Secondary | ICD-10-CM

## 2015-07-25 MED ORDER — APIXABAN 5 MG PO TABS: 5.0000 mg | ORAL_TABLET | Freq: Two times a day (BID) | ORAL | Status: DC

## 2015-07-25 NOTE — Assessment & Plan Note (Signed)
Dec 2015-s/p loop recorder

## 2015-07-25 NOTE — Assessment & Plan Note (Signed)
Now on Eliquis 

## 2015-07-25 NOTE — Assessment & Plan Note (Signed)
Put on Lasix 20 mg daily during his recent admission

## 2015-07-25 NOTE — Assessment & Plan Note (Signed)
Prior PCI of the right coronary artery in 2009, 2011 CABG with LIMA to LAD, saphenous vein graft to PDA PL and saphenous vein graft to diagonal May 2012  Resolute stent 4.0 x 18 mm to proximal circumflex by Dr. Claiborne Billings for/15/2013 for ACS following  Prior unbypassed  artery

## 2015-07-25 NOTE — Assessment & Plan Note (Signed)
Acute bilateral recurrent pulmonary embolism 07/15/15 while off anticoagulation s/p rectus sheath hematoma 07/03/15

## 2015-07-25 NOTE — Assessment & Plan Note (Addendum)
Now back on anticoagulation w/o sign rebleed in abd wall.  D/w pt.  He has f/u with cards pending.  Will await cards input on anticoag rx.  I'll defer for now.  Recheck CBC and lytes in meantime, d/w pt, pending at [facility].

## 2015-07-25 NOTE — Assessment & Plan Note (Signed)
Patient states that now "I feel like I've been drunk for a week." Lightheaded but not having vertigo sx. Ongoing for 1.5 years, since the initial CVA but worse recently. D/w pt that his was likely his initial CVA sx, w/o a new CVA but with strain of recent events his ability to compensate has likely been set back temporarily.  Deconditioned, will check on OT/PT.  His energy level is poor.  Prev was on ritalin w/o help.  I will ask for cards input if trial of another stimulant would even be acceptable.

## 2015-07-25 NOTE — Progress Notes (Signed)
07/25/2015 [name]   1950/06/12  [id number]  Primary Physician [name], MD Primary Cardiologist: Dr Claiborne Billings  HPI:  66 y/o male with established CAD s/p stenting of his RCA in May 2011, s/p CABG surgery with LIMA to the LAD, sequential vein to the distal RCA and PDA, and a vein to the AM of his RCA in May 2012.  He then underwent intervention to an ungrafted CFX with a  DES April 2013, (the SVG-AM was occluded then, the LIMA-LAD was patent and the SVG- RCA / PDA was patent).   Additional problems include obstructive sleep apnea, on C-pap, hyperlipidemia, and hypertension.  An echo Doppler study in May 2013 suggested an ejection fraction of 38% and on nuclear imaging was 37% with scar in the LAD territory.  He was admitted to [facility] hospital in November 2015 with a large right MCA territory CVA. TEE was negative for embolic source. He underwent implantation of a loop recorder by Dr. Rayann Heman. He was readmitted with bilateral submassive pulmonary embolism in Jan 2016. Venous Dopplers were positive for DVT. Ejection fraction was 25-30%, which is felt to be contributed by strain from his pulmonary emboli.He was placed on Xarelto.   In April 2017 he was admitted with fever and abdominal plain. He was felt to have tonsillitis and he also was found to have a rectus sheath hematoma, possible secondary to coughing. His Xarelto and ASA were stopped. On discharge 07/08/15 plans for resuming Xarelto and ASA were outlined in the DC summary but for some reason this was never done. By the time he was able to get instructions from our office on resuming Xarelto on 07/14/15 he had been off anticoagulation for about two weeks. He was then admitted 07/15/15 with recurrent bilateral pulmonary embolism. He was switched from Xarelto to Eliquis at PE dose-(10 mg BID with plans to decrease to 5 mg BID on 07/24/15) to decrease his bleeding risk. He is in the office today for follow up. He and his wife are upset that it  took so long to clarify the timing of anticoagulation resumption after his discharge 07/08/15. Since his discharge 07/18/15 he has had no recurrent bleeding. He does feel weak. His labs 07/24/15 show a Hgb of 10.2, TSH in Oct was WNL. Renal function is WNL except for a low K+ which appears to be chronic.    Current Outpatient Prescriptions  Medication Sig Dispense Refill  . acetaminophen (TYLENOL) 325 MG tablet Take 1 tablet (325 mg total) by mouth every 6 (six) hours as needed for mild pain.    Marland Kitchen ALPRAZolam (XANAX) 0.25 MG tablet Take 0.5 tablets (0.125 mg total) by mouth every 8 (eight) hours as needed for anxiety. 30 tablet 0  . apixaban (ELIQUIS) 5 MG TABS tablet Take 1 tablet (5 mg total) by mouth 2 (two) times daily. Start taking May 8th, 2017. 60 tablet 11  . aspirin EC 81 MG tablet Take 1 tablet (81 mg total) by mouth daily.    . colchicine 0.6 MG tablet Take 0.6 mg by mouth daily as needed (for gout). Reported on 05/26/2015  0  . ferrous sulfate 325 (65 FE) MG tablet Take 1 tablet (325 mg total) by mouth 3 (three) times daily with meals. 90 tablet 0  . Fexofenadine HCl (ALLEGRA PO) Take 1 tablet by mouth daily as needed (seasonal allergies).    . fludrocortisone (FLORINEF) 0.1 MG tablet Take 1 tablet (0.1 mg total) by mouth 2 (two) times daily. [place]  tablet 1  . fluticasone (FLONASE) 50 MCG/ACT nasal spray Place 2 sprays into both nostrils at bedtime as needed (seasonal allergies).     . furosemide (LASIX) 20 MG tablet Take 1 tablet (20 mg total) by mouth daily. 30 tablet 1  . HYDROcodone-acetaminophen (NORCO/VICODIN) 5-325 MG tablet Take 1 tablet by mouth every 6 (six) hours as needed for moderate pain. 30 tablet 0  . hydrocortisone (ANUSOL-HC) 25 MG suppository Place 25 mg rectally as needed for hemorrhoids or itching.    . Melatonin 10 MG CAPS Take 10 mg by mouth at bedtime as needed (sleep).     . midodrine (PROAMATINE) 2.5 MG tablet Take 1 tablet (2.5 mg total) by mouth 3 (three) times daily  with meals. 90 tablet 2  . nitroGLYCERIN (NITROSTAT) 0.4 MG SL tablet Place 1 tablet (0.4 mg total) under the tongue every 5 (five) minutes as needed. For chest pain (Patient taking differently: Place 0.4 mg under the tongue every 5 (five) minutes as needed for chest pain. ) 25 tablet 12  . pantoprazole (PROTONIX) 40 MG tablet Take 1 tablet (40 mg total) by mouth 2 (two) times daily. 60 tablet 8  . potassium chloride (K-DUR) 10 MEQ tablet Take 2 tablets (20 mEq total) by mouth 3 (three) times daily.    Marland Kitchen PRESCRIPTION MEDICATION Inhale into the lungs at bedtime. CPAP    . rosuvastatin (CRESTOR) 40 MG tablet Take 1 tablet (40 mg total) by mouth daily. (Patient taking differently: Take 40 mg by mouth at bedtime. ) 90 tablet 3  . senna (SENOKOT) 8.6 MG TABS tablet Take 1 tablet by mouth 2 (two) times daily.    Marland Kitchen senna-docusate (SENNA S) 8.6-50 MG tablet Take 1 tablet by mouth 2 (two) times daily.    . sertraline (ZOLOFT) 50 MG tablet Take 1 tablet (50 mg total) by mouth daily. 30 tablet 11  . tiZANidine (ZANAFLEX) 4 MG tablet Take 4 mg by mouth 2 (two) times daily as needed for muscle spasms.   5   No current facility-administered medications for this visit.    Allergies  Allergen Reactions  . Diphenhydramine Hcl Anaphylaxis  . Adhesive [Tape] Other (See Comments)    Tears skin - please use paper tape    Social History   Social History  . Marital Status: Married    Spouse Name: N/A  . Number of Children: 5  . Years of Education: N/A   Occupational History  .      works at the [employer]  . Smoking status: Former Smoker -- 2.00 packs/day for 4 years    Types: Cigarettes    Quit date: 01/30/1969  . Smokeless tobacco: Never Used     Comment: "quit smoking cigarettes  in the 1970's"  . Alcohol Use: No  . Drug Use: Yes    Special: Marijuana     Comment: "smoked some pot in college"  . Sexual Activity: Not Currently   Other Topics Concern  . Not on  file   Social History Narrative   Quit smoking 40 years ago   Married    Likes to play guitar and sing with church group unable since 02/12/14 stroke    Gregory Wiley 1970-1972, [place], no service related issues.       Review of Systems: General: negative for chills, fever, night sweats or weight changes.  Cardiovascular: negative for chest pain, dyspnea on exertion, edema, orthopnea, palpitations, paroxysmal nocturnal dyspnea or shortness of  breath Dermatological: negative for rash Respiratory: negative for cough or wheezing Urologic: negative for hematuria Abdominal: negative for nausea, vomiting, diarrhea, bright red blood per rectum, melena, or hematemesis Neurologic: negative for visual changes, syncope, or dizziness All other systems reviewed and are otherwise negative except as noted above.    Blood pressure 100/70, pulse 88, height 6\' 1"  (1.854 m), weight 189 lb (85.73 kg), SpO2 99 %.  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 Claiborne Billings May 2013  Chronic combined systolic and diastolic congestive heart failure (HCC) Put on Lasix 20 mg daily during his recent admission  Chronic anticoagulation Now on Eliquis   PLAN  He is currently on the correct dose of Eliquis- 5 mg BID. I  suspect he will be on anticoagulation for life unless he has recurrent bleeding issues. If the anticoagulation needs to be stopped in the future for surgery he will need Lovenox bridging, this can be handled by our pharmacist.   I did suggest he cut his Lasix to 20 mg MWF. I also ordered a f/u BMP (his K+ was 3.4 on 20 meq TID-?) and a CBC in two weeks. F/U with Dr Claiborne Billings in 3 months.   Kameka Whan K PA-C 07/25/2015 1:02 PM

## 2015-07-25 NOTE — Patient Instructions (Addendum)
Your physician wants you to follow-up in: 3 Months with Dr Claiborne Billings. You will receive a reminder letter in the mail two months in advance. If you don't receive a letter, please call our office to schedule the follow-up appointment.  Your physician has recommended you make the following change in your medication: Decease Lasix to 3 days a week  Your physician recommends that you return for lab work in: 2 Weeks BMP and CBC

## 2015-07-26 ENCOUNTER — Other Ambulatory Visit: Payer: Self-pay

## 2015-07-26 MED ORDER — POTASSIUM CHLORIDE ER 10 MEQ PO TBCR: 20.0000 meq | EXTENDED_RELEASE_TABLET | Freq: Three times a day (TID) | ORAL | Status: DC

## 2015-07-26 NOTE — Telephone Encounter (Signed)
[name] (DPR signed) request refill K to [facility]; advised done per protocol and [name] voiced understanding.

## 2015-07-31 ENCOUNTER — Telehealth: Payer: Self-pay

## 2015-07-31 LAB — CUP PACEART REMOTE DEVICE CHECK: Date Time Interrogation Session: 20170224003759

## 2015-07-31 NOTE — Telephone Encounter (Signed)
Please give the order.  Thanks.   

## 2015-07-31 NOTE — Telephone Encounter (Signed)
Dorean PT with [facility] left v/m requesting verbal orders for home health PT for 2 x a week for 4 weeks.

## 2015-07-31 NOTE — Telephone Encounter (Signed)
[facility] advised.

## 2015-08-01 ENCOUNTER — Telehealth: Payer: Self-pay

## 2015-08-01 NOTE — Telephone Encounter (Signed)
Have patient inc fluid intake and report back with BP.  Thanks.  Please give the order for [facility].  Thanks.

## 2015-08-01 NOTE — Telephone Encounter (Signed)
I was only able to reach the answering service because of this being addressed after 5 pm.  I will ask Rollene Fare to please try to return call on Wednesday morning.

## 2015-08-01 NOTE — Telephone Encounter (Signed)
Chrissie nurse case mgr with [facility] left v/m requesting verbal orders for home health nursing; Physical therapist did assessment and recommends nursing to be added on. Pt complains with dizziness, h/a, fatigue, loss of appetite and poor fluid intake, decreased urinary output. Please advise.

## 2015-08-02 NOTE — Telephone Encounter (Signed)
Chrissie with [facility] notified as instructed by telephone and verbalized understanding. Was advised by Chrissie that a nurse will go out Friday to see the patient.  Called and spoke to patient's wife and advised her to have patient inc fluid intake and report back with BP readings if the reading are not within normal readings. Patient's wife stated that she will monitor his BP readings and will call the office if any problems. Advised patient's wife that [facility] will be in touch with her about coming out Friday.

## 2015-08-02 NOTE — Telephone Encounter (Signed)
Noted. Thanks.

## 2015-08-04 ENCOUNTER — Telehealth: Payer: Self-pay | Admitting: Family Medicine

## 2015-08-04 NOTE — Telephone Encounter (Signed)
[facility] called requesting orders for skilled nursing - 2 times a week for 2 weeks  And addition of speeh therapy eval  cb [phone], ask for lisa

## 2015-08-04 NOTE — Telephone Encounter (Signed)
Please give the order.  Thanks.   

## 2015-08-04 NOTE — Telephone Encounter (Signed)
Verbal order given to [name] with [facility].

## 2015-08-07 ENCOUNTER — Telehealth: Payer: Self-pay

## 2015-08-07 ENCOUNTER — Telehealth: Payer: Self-pay | Admitting: Family Medicine

## 2015-08-07 ENCOUNTER — Other Ambulatory Visit: Payer: Self-pay | Admitting: Family Medicine

## 2015-08-07 DIAGNOSIS — D649 Anemia, unspecified: Secondary | ICD-10-CM

## 2015-08-07 NOTE — Telephone Encounter (Signed)
Speech therapist with [facility] called and performed an evaluation and has determined he needs no further ST services at this time.

## 2015-08-07 NOTE — Telephone Encounter (Signed)
OPEN ERROR

## 2015-08-07 NOTE — Telephone Encounter (Signed)
Eliquis 5 mg approved by [facility]. Local pharmacy notified.

## 2015-08-07 NOTE — Telephone Encounter (Signed)
Patients wife called stating that patient needs a prior authorization for a eliquis, patient only has four pills left; wife wants a call back.

## 2015-08-07 NOTE — Telephone Encounter (Signed)
Pt spouse called requesting refill on ferrous sulfate 325 (65 FE) MG tablet [id number].  PT is scheduled for labs and AWV for Friday 08/11/15 and wife would like to know if pt's iron will also be checked at that time.  They are also not seeing Dr. Wynelle Bourgeois any longer and if PCP would like for patient to continue to take the fludrocortisone (FLORINEF) 0.1 MG tablet [id number] they will need a new proscription.  Best number to call spouse with any questions is [phone]

## 2015-08-08 MED ORDER — FERROUS SULFATE 325 (65 FE) MG PO TABS: 325.0000 mg | ORAL_TABLET | Freq: Three times a day (TID) | ORAL | Status: DC

## 2015-08-08 NOTE — Telephone Encounter (Signed)
Orders are in for labs, including iron.  Refill sent for iron.  I'll route to Dr. Loanne Drilling for input re: fludrocortisone.

## 2015-08-08 NOTE — Telephone Encounter (Signed)
Patients spouse was informed about approval of Eliquis, voiced understanding

## 2015-08-08 NOTE — Telephone Encounter (Signed)
i no longer see this patient, as adrenal w/u was neg. The decision to continue this would be up to PCP or cardiol

## 2015-08-08 NOTE — Telephone Encounter (Signed)
Noted. Thanks.

## 2015-08-08 NOTE — Telephone Encounter (Signed)
Here is what I wrote when I last saw him:   "If [acth test] is normal, you have non-adrenal low blood pressure, so I'll step aside then."  I should have been more specific, but non-adrenal hypotension is outside of my field. If the decision was up to me, I would prescribe the minimal dosage (if any is needed) of this to treat hypotension. Does this help?

## 2015-08-08 NOTE — Telephone Encounter (Signed)
I realize that you are not currently involved in the care of this patient.  I didn't see a recommendation from you in the EMR about continuing/stopping this medicine after his cortisol testing on 06/16/15.   What was your recommendation about continuing/stopping this medicine after his cortisol testing on 06/16/15?

## 2015-08-09 ENCOUNTER — Ambulatory Visit (INDEPENDENT_AMBULATORY_CARE_PROVIDER_SITE_OTHER): Payer: Medicare Other | Admitting: *Deleted

## 2015-08-09 DIAGNOSIS — I63411 Cerebral infarction due to embolism of right middle cerebral artery: Secondary | ICD-10-CM

## 2015-08-09 MED ORDER — FLUDROCORTISONE ACETATE 0.1 MG PO TABS: 0.1000 mg | ORAL_TABLET | Freq: Two times a day (BID) | ORAL | Status: DC

## 2015-08-09 NOTE — Addendum Note (Signed)
Addended by: Tonia Ghent on: 08/09/2015 10:23 AM   Modules accepted: Orders

## 2015-08-09 NOTE — Telephone Encounter (Signed)
Please verify what needs to be advised to the patient and his wife.

## 2015-08-09 NOTE — Telephone Encounter (Signed)
Please continue the fludrocortisone for now since his last BP was only 100/70 at the cardiology clinic.  If his BP is higher later on or if he retains too much fluid, then we may have to stop it.  I sent the refill in the meantime.  Thanks.

## 2015-08-09 NOTE — Telephone Encounter (Signed)
See below. I would continue the med for now since his last BP was only 100/70 at the cardiology clinic.  I sent the refill in the meantime.  I'll also route to cardiology for input.   I am hesitant to stop the medicine at this point given his relatively low BP.   Thanks.

## 2015-08-09 NOTE — Telephone Encounter (Signed)
Yes, thanks.  We'll notify patient/family.

## 2015-08-09 NOTE — Telephone Encounter (Signed)
App help of all involved.  See below.  Okay to continue for now.

## 2015-08-09 NOTE — Telephone Encounter (Signed)
OK to continue from cardiology standpoint but at lowest dose possible to prevent symptomatic orthostatic hypotension. Side efect is fluid retention which could be an issue with his EF of 25%.   Kerin Ransom PA-C 08/09/2015 11:25 AM

## 2015-08-09 NOTE — Telephone Encounter (Signed)
Patient's wife notified as instructed by telephone and verbalized understanding. 

## 2015-08-10 ENCOUNTER — Telehealth: Payer: Self-pay | Admitting: Cardiology

## 2015-08-10 NOTE — Telephone Encounter (Signed)
Spoke w/ pt wife and requested that pt send a manual transmission b/c his home monitor has not updated in at least 14 days.   

## 2015-08-10 NOTE — Progress Notes (Signed)
Carelink Summary Report / Loop Recorder 

## 2015-08-11 ENCOUNTER — Encounter: Payer: Self-pay | Admitting: Family Medicine

## 2015-08-11 ENCOUNTER — Ambulatory Visit (INDEPENDENT_AMBULATORY_CARE_PROVIDER_SITE_OTHER): Payer: Medicare Other | Admitting: Family Medicine

## 2015-08-11 ENCOUNTER — Other Ambulatory Visit: Payer: Medicare Other

## 2015-08-11 ENCOUNTER — Ambulatory Visit: Payer: Medicare Other

## 2015-08-11 VITALS — BP 112/74 | HR 93 | Temp 97.9°F | Ht 72.0 in | Wt 190.0 lb

## 2015-08-11 DIAGNOSIS — E162 Hypoglycemia, unspecified: Secondary | ICD-10-CM | POA: Diagnosis not present

## 2015-08-11 DIAGNOSIS — Z0000 Encounter for general adult medical examination without abnormal findings: Secondary | ICD-10-CM | POA: Diagnosis not present

## 2015-08-11 DIAGNOSIS — D649 Anemia, unspecified: Secondary | ICD-10-CM | POA: Diagnosis not present

## 2015-08-11 DIAGNOSIS — Z8673 Personal history of transient ischemic attack (TIA), and cerebral infarction without residual deficits: Secondary | ICD-10-CM | POA: Diagnosis not present

## 2015-08-11 DIAGNOSIS — I5042 Chronic combined systolic (congestive) and diastolic (congestive) heart failure: Secondary | ICD-10-CM

## 2015-08-11 LAB — CBC WITH DIFFERENTIAL/PLATELET
Basophils Absolute: 65 cells/uL (ref 0–200)
Basophils Relative: 1 %
Eosinophils Absolute: 260 cells/uL (ref 15–500)
Eosinophils Relative: 4 %
HCT: 37.8 % — ABNORMAL LOW (ref 38.5–50.0)
Hemoglobin: 11.7 g/dL — ABNORMAL LOW (ref 13.2–17.1)
Lymphocytes Relative: 38 %
Lymphs Abs: 2470 cells/uL (ref 850–3900)
MCH: 26.2 pg — ABNORMAL LOW (ref 27.0–33.0)
MCHC: 31 g/dL — ABNORMAL LOW (ref 32.0–36.0)
MCV: 84.8 fL (ref 80.0–100.0)
MPV: 11.6 fL (ref 7.5–12.5)
Monocytes Absolute: 390 cells/uL (ref 200–950)
Monocytes Relative: 6 %
Neutro Abs: 3315 cells/uL (ref 1500–7800)
Neutrophils Relative %: 51 %
Platelets: 216 10*3/uL (ref 140–400)
RBC: 4.46 MIL/uL (ref 4.20–5.80)
RDW: 18.7 % — ABNORMAL HIGH (ref 11.0–15.0)
WBC: 6.5 10*3/uL (ref 3.8–10.8)

## 2015-08-11 LAB — GLUCOSE, POCT (MANUAL RESULT ENTRY): POC Glucose: 113 mg/dl — AB (ref 70–99)

## 2015-08-11 MED ORDER — FUROSEMIDE 20 MG PO TABS: 20.0000 mg | ORAL_TABLET | Freq: Every day | ORAL | Status: DC | PRN

## 2015-08-11 NOTE — Progress Notes (Signed)
Pre visit review using our clinic review tool, if applicable. No additional management support is needed unless otherwise documented below in the visit note.  I have personally reviewed the Medicare Annual Wellness questionnaire and have noted 1. The patient's medical and social history 2. Their use of alcohol, tobacco or illicit drugs 3. Their current medications and supplements 4. The patient's functional ability including ADL's, fall risks, home safety risks and hearing or visual             impairment. 5. Diet and physical activities 6. Evidence for depression or mood disorders  The patients weight, height, BMI have been recorded in the chart and visual acuity is per eye clinic.  I have made referrals, counseling and provided education to the patient based review of the above and I have provided the pt with a written personalized care plan for preventive services.  Provider list updated- see scanned forms.  Routine anticipatory guidance given to patient.  See health maintenance.  Flu 2016 Shingles d/w pt.  PNA 2016 Tetanus d/w pt.  Colonoscopy 2014 Prostate cancer screening- deferred for now.  Even if he had abnormal PSA, it wouldn't be reasonable to hold anticoagulation for consideration of biopsy.  Given his other issues, this was discussed and deferred.   Advance directive- wife designated if patient were incapacitated.   Cognitive function addressed- see scanned forms- and if abnormal then additional documentation follows.   Low BP still noted, in spite of fludrocortisone use.  He has h/o low K that predates his lasix use.  Still with low BP at home, down to SBP in the 80s.  Had been on laxis QOD.  Due for f/u labs.  See plan. Still with sig fatigue.    PMH and SH reviewed  Meds, vitals, and allergies reviewed.   ROS: Per HPI.  Unless specifically indicated otherwise in HPI, the patient denies:  General: fever. Eyes: acute vision changes ENT: sore throat Cardiovascular:  chest pain Respiratory: SOB GI: vomiting GU: dysuria Musculoskeletal: acute back pain Derm: acute rash Neuro: acute motor dysfunction Psych: worsening mood Endocrine: polydipsia Heme: bleeding Allergy: hayfever  GEN: nad, alert and oriented HEENT: mucous membranes moist NECK: supple w/o LA CV: rrr. PULM: ctab, no inc wob ABD: soft, +bs EXT: trace BLE edema SKIN: no acute rash In wheelchair at baseline.  L side flaccid at baseline.

## 2015-08-11 NOTE — Patient Instructions (Addendum)
Check with your insurance to see if they will cover the shingles and tetanus shots.   Continue the potassium for now, as is.  Take the lasix only if you have more swelling.  Please update [place] Tuesday about your swelling, any lasix use, and lightheadedness.  Let us know when you want to get tested for hearing aids.   Go to the lab on the way out.  We'll contact you with your lab report. Take care.  Glad to see you.

## 2015-08-12 LAB — CUP PACEART REMOTE DEVICE CHECK: Date Time Interrogation Session: 20170326010650

## 2015-08-12 NOTE — Progress Notes (Signed)
Carelink summary report received. Battery status OK. Normal device function. No new symptom episodes, tachy episodes, brady, or pause episodes. No new AF episodes. Monthly summary reports and ROV/PRN 

## 2015-08-14 DIAGNOSIS — Z0000 Encounter for general adult medical examination without abnormal findings: Secondary | ICD-10-CM | POA: Insufficient documentation

## 2015-08-14 LAB — CUP PACEART REMOTE DEVICE CHECK: Date Time Interrogation Session: 20170425010832

## 2015-08-14 NOTE — Progress Notes (Signed)
Carelink summary report received. Battery status OK. Normal device function. No new symptom episodes, tachy episodes, brady, or pause episodes. No new AF episodes. Monthly summary reports and ROV/PRN 

## 2015-08-14 NOTE — Assessment & Plan Note (Signed)
See scanned forms.  Flu 2016 Shingles d/w pt.  PNA 2016 Tetanus d/w pt.  Colonoscopy 2014 Prostate cancer screening- deferred for now.  Even if he had abnormal PSA, it wouldn't be reasonable to hold anticoagulation for consideration of biopsy.  Given his other issues, this was discussed and deferred.   Advance directive- wife designated if patient were incapacitated.   Cognitive function addressed- see scanned forms- and if abnormal then additional documentation follows.

## 2015-08-14 NOTE — Assessment & Plan Note (Addendum)
With low BP noted.   Stop lasix for now.  Will have him take lasix only if more swelling.  Continue the potassium for now, as is at 8mEq per day as this predates his lasix use.  He'll update [place] Tuesday about swelling, any lasix use, and lightheadedness which had been going on.  Continue fludrocortisone for now.  See notes on labs.    He got lightheaded and sweaty at the end of the visit.  He was fasting for labs, with a PM appointment.  I didn't realize he was fasting until after he had sx.  Sx resolved with taking juice and crackers PO, no residual sx o/w, back to baseline.  Vitals rechecked at [place], along with sugar before taking a snack.  He was to get a meal after labs done, right after the event, and update [place] as needed. Was likely relative hypoglycemia with the prolonged fasting time.  D/w pt.  He agreed.

## 2015-08-15 ENCOUNTER — Telehealth: Payer: Self-pay | Admitting: Family Medicine

## 2015-08-15 LAB — BASIC METABOLIC PANEL
BUN: 15 mg/dL (ref 7–25)
CO2: 24 mmol/L (ref 20–31)
Calcium: 9 mg/dL (ref 8.6–10.3)
Chloride: 104 mmol/L (ref 98–110)
Creat: 0.99 mg/dL (ref 0.70–1.25)
Glucose, Bld: 98 mg/dL (ref 65–99)
Potassium: 4.1 mmol/L (ref 3.5–5.3)
Sodium: 141 mmol/L (ref 135–146)

## 2015-08-15 LAB — LIPID PANEL
Cholesterol: 130 mg/dL (ref 125–200)
HDL: 49 mg/dL (ref >=40)
LDL Cholesterol: 66 mg/dL (ref <130)
Total CHOL/HDL Ratio: 2.7 Ratio (ref <=5.0)
Triglycerides: 75 mg/dL (ref <150)
VLDL: 15 mg/dL (ref <30)

## 2015-08-15 LAB — IRON AND TIBC
%SAT: 23 % (ref 15–60)
Iron: 47 ug/dL — ABNORMAL LOW (ref 50–180)
TIBC: 204 ug/dL — ABNORMAL LOW (ref 250–425)
UIBC: 157 ug/dL (ref 125–400)

## 2015-08-15 NOTE — Telephone Encounter (Signed)
Pt's wife calling with blood pressure readings: Sat 08/12/15  100/49 Sun 08/13/15  98/53 Mon 08/14/15  107/59 Tues 08/15/15 (Taken by the nurse) 98/58 She states call if needed:  [phone]

## 2015-08-16 ENCOUNTER — Other Ambulatory Visit: Payer: Self-pay | Admitting: Family Medicine

## 2015-08-16 DIAGNOSIS — D509 Iron deficiency anemia, unspecified: Secondary | ICD-10-CM

## 2015-08-16 NOTE — Telephone Encounter (Signed)
See result note.  

## 2015-08-18 ENCOUNTER — Encounter: Payer: Self-pay | Admitting: Cardiology

## 2015-08-22 ENCOUNTER — Ambulatory Visit: Payer: Medicare Other | Admitting: Family Medicine

## 2015-08-25 ENCOUNTER — Encounter: Payer: Self-pay | Admitting: Cardiology

## 2015-08-28 DIAGNOSIS — I69352 Hemiplegia and hemiparesis following cerebral infarction affecting left dominant side: Secondary | ICD-10-CM | POA: Diagnosis not present

## 2015-08-28 DIAGNOSIS — I5042 Chronic combined systolic (congestive) and diastolic (congestive) heart failure: Secondary | ICD-10-CM

## 2015-08-28 DIAGNOSIS — I110 Hypertensive heart disease with heart failure: Secondary | ICD-10-CM | POA: Diagnosis not present

## 2015-08-28 DIAGNOSIS — I2510 Atherosclerotic heart disease of native coronary artery without angina pectoris: Secondary | ICD-10-CM

## 2015-08-28 DIAGNOSIS — I2782 Chronic pulmonary embolism: Secondary | ICD-10-CM | POA: Diagnosis not present

## 2015-08-28 DIAGNOSIS — I2699 Other pulmonary embolism without acute cor pulmonale: Secondary | ICD-10-CM | POA: Diagnosis not present

## 2015-09-03 DIAGNOSIS — I69952 Hemiplegia and hemiparesis following unspecified cerebrovascular disease affecting left dominant side: Secondary | ICD-10-CM | POA: Insufficient documentation

## 2015-09-07 ENCOUNTER — Other Ambulatory Visit: Payer: Self-pay | Admitting: Endocrinology

## 2015-09-08 ENCOUNTER — Ambulatory Visit (INDEPENDENT_AMBULATORY_CARE_PROVIDER_SITE_OTHER): Payer: Medicare Other | Admitting: *Deleted

## 2015-09-08 DIAGNOSIS — I63411 Cerebral infarction due to embolism of right middle cerebral artery: Secondary | ICD-10-CM

## 2015-09-11 NOTE — Progress Notes (Signed)
Carelink Summary Report / Loop Recorder 

## 2015-09-14 LAB — CUP PACEART REMOTE DEVICE CHECK: Date Time Interrogation Session: 20170525010636

## 2015-09-29 ENCOUNTER — Telehealth: Payer: Self-pay | Admitting: Cardiology

## 2015-09-29 LAB — CUP PACEART REMOTE DEVICE CHECK: Date Time Interrogation Session: 20170624013840

## 2015-09-29 NOTE — Telephone Encounter (Signed)
LMOVM reminding pt to send remote transmission.   

## 2015-10-05 ENCOUNTER — Encounter: Payer: Self-pay | Admitting: Cardiology

## 2015-10-06 ENCOUNTER — Other Ambulatory Visit: Payer: Self-pay

## 2015-10-06 MED ORDER — FLUDROCORTISONE ACETATE 0.1 MG PO TABS: 0.1000 mg | ORAL_TABLET | Freq: Two times a day (BID) | ORAL | Status: DC

## 2015-10-06 NOTE — Telephone Encounter (Signed)
Please continue the fludrocortisone for now and we'll recheck his BP at OV.  If his BP is higher later on or if he retains too much fluid, then we may have to stop it.  I sent the refill in the meantime.  OV in about 1 month would be good.   Thanks.

## 2015-10-06 NOTE — Telephone Encounter (Signed)
Mrs Hogsett left v/m requesting a refill if Dr Damita Dunnings wants pt to stay on the Fludrocortisone. Last refilled # 60 x 1 on 08/09/15. Pt has enough med to have morning dose on 10/09/15. Pt medicare wellness on 08/11/15. Mrs Borrelli also wants to know when pt needs to schedule f/u appt to see DR Damita Dunnings. Mrs Knabb will schedule lab appt also when cb on Monday.

## 2015-10-09 ENCOUNTER — Ambulatory Visit (INDEPENDENT_AMBULATORY_CARE_PROVIDER_SITE_OTHER): Payer: Medicare Other | Admitting: *Deleted

## 2015-10-09 DIAGNOSIS — I63411 Cerebral infarction due to embolism of right middle cerebral artery: Secondary | ICD-10-CM | POA: Diagnosis not present

## 2015-10-09 NOTE — Progress Notes (Signed)
Carelink Summary Report / Loop Recorder 

## 2015-10-09 NOTE — Telephone Encounter (Signed)
Wife advised.  1 month FU appt scheduled.

## 2015-10-12 ENCOUNTER — Encounter: Payer: Self-pay | Admitting: Neurology

## 2015-10-12 ENCOUNTER — Ambulatory Visit (INDEPENDENT_AMBULATORY_CARE_PROVIDER_SITE_OTHER): Payer: Medicare Other | Admitting: Neurology

## 2015-10-12 VITALS — BP 116/84 | HR 68 | Resp 22 | Wt 199.0 lb

## 2015-10-12 DIAGNOSIS — R269 Unspecified abnormalities of gait and mobility: Secondary | ICD-10-CM

## 2015-10-12 DIAGNOSIS — I69952 Hemiplegia and hemiparesis following unspecified cerebrovascular disease affecting left dominant side: Secondary | ICD-10-CM

## 2015-10-12 DIAGNOSIS — Z951 Presence of aortocoronary bypass graft: Secondary | ICD-10-CM

## 2015-10-12 DIAGNOSIS — Z7901 Long term (current) use of anticoagulants: Secondary | ICD-10-CM | POA: Diagnosis not present

## 2015-10-12 DIAGNOSIS — G4733 Obstructive sleep apnea (adult) (pediatric): Secondary | ICD-10-CM

## 2015-10-12 DIAGNOSIS — I63411 Cerebral infarction due to embolism of right middle cerebral artery: Secondary | ICD-10-CM | POA: Diagnosis not present

## 2015-10-12 DIAGNOSIS — M542 Cervicalgia: Secondary | ICD-10-CM

## 2015-10-12 NOTE — Progress Notes (Signed)
[facility]  PATIENT: Gregory Wiley DOB: 16-Mar-1951  REFERRING CLINICIAN: james Little HISTORY FROM: patient REASON FOR VISIT: stroke   HISTORICAL  CHIEF COMPLAINT:  Chief Complaint  Patient presents with  . History of CVA    Denies new stroke sx.  Sts. since last ov he was hospitalized with a PE.  Plavix was d/c and he has started Eliquis.  Sts. left leg is weaker, causing more falls./fim  . 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   He fell last Wednesday and his wife notes he seems to be sleeping more since then.    He hit the lift chair.   There was no LOC.    He does not think head was hit.   He needed to use his phone to call for help.  He and his wife feel that his walking has been worse since the fall.    He also has been more somnolent the past week  Stroke:   He denies any new stroke symptoms.    He is on Eliquis for stroke prophylaxis.    He had a recent bleed in his rectus muscle due to coughing and the blood thinner.       Stroke History:  On 02/12/2014, he woke up with weakness on the left side and was unable to stand up. He went to the emergency room.Marland Kitchen MRI of the brain showed an acute right MCA territory stroke that mostly involved the right temporal lobe with extension to the insula and the basal ganglia on the right. MR angiogram showed an occlusion of the right M1 segment  Of note, he had been on aspirin and Plavix that had been discontinued a few weeks earlier due to a bleeding ulcer.   Ultrasound of the carotid arteries showed less than 39% stenosis. A transesophageal echocardiogram showed a LVEF equals 45%, there was septal hypokinesia, no PFO or ASD was identified.  Cardioembolism was suspected.  He was initially placed on Plavix but that was subsequently changed to Xarelto after he was found to have pulmonary embolisms while at rehabilitation  center.  Gait/Strength/sensation:    He has weakness on the left side. He uses a quad cane around the house and was able to  go about 50 feet without stopping.  This is worse since the fall.   He can get throughout the house with the cane most of the time but wife helps a lot of the time also.   Marland Kitchen   His leg is weak with left sided spasticity.   He also has numbness on the left side with some pain. He had some neglect and numbness initially that improved.  Headache:   The occipital nerve blocks in the past greatly helped to reduce the severe headache and neck pain.   Currently he has some pain in his neck but it is not severe.    He feels the neck is stiffer since his fall.    Bladder:  He has urinary frequency and urgency this year.   He has not had incontinence.  OSA/sleep/sleepiness:    He has obstructive sleep apnea but takes mask off in middle of night so only uses about 1/2 the nights, often < 4 hours..   He snores still despite weight loss.    He has excessive daytime sleepiness and is on methylphenidate 10 mg by mouth twice a day with  mild much benefit.  EDS is better if he uses CPAP.   However, he very rarely uses CPAP   Cognitive:   He and his wife note some decreased attention and decrease executive function abilities.  However, neglect has improved.    REVIEW OF SYSTEMS:  Constitutional: No fevers, chills, sweats, or change in appetite.  He sleeps poorly.    He is very tired Eyes: No visual changes, double vision, eye pain Ear, nose and throat: No hearing loss, ear pain, nasal congestion, sore throat Cardiovascular: No chest pain, palpitations Respiratory:  No shortness of breath at rest or with exertion.   No wheezes GastrointestinaI: No nausea, vomiting, diarrhea, abdominal pain, fecal incontinence Genitourinary:  No dysuria, urinary retention or frequency.  No nocturia. Musculoskeletal:  No neck pain, back pain but he has shoulder and hip pain Integumentary: No rash, pruritus,  skin lesions Neurological: as above Psychiatric: No depression at this time.  No anxiety Endocrine: No palpitations, diaphoresis, change in appetite, change in weigh.  He notes feeling cold and increased thirst Hematologic/Lymphatic:  No anemia, purpura, petechiae. Allergic/Immunologic: No itchy/runny eyes, nasal congestion, recent allergic reactions, rashes  ALLERGIES: Allergies  Allergen Reactions  . Diphenhydramine Hcl Anaphylaxis  . Adhesive [Tape] Other (See Comments)    Tears skin - please use paper tape    HOME MEDICATIONS: Outpatient Medications Prior to Visit  Medication Sig Dispense Refill  . acetaminophen (TYLENOL) 325 MG tablet Take 1 tablet (325 mg total) by mouth every 6 (six) hours as needed for mild pain.    Marland Kitchen ALPRAZolam (XANAX) 0.25 MG tablet Take 0.5 tablets (0.125 mg total) by mouth every 8 (eight) hours as needed for anxiety. 30 tablet 0  . apixaban (ELIQUIS) 5 MG TABS tablet Take 1 tablet (5 mg total) by mouth 2 (two) times daily. Start taking May 8th, 2017. 60 tablet 11  . aspirin EC 81 MG tablet Take 1 tablet (81 mg total) by mouth daily.    . colchicine 0.6 MG tablet Take 0.6 mg by mouth daily as needed (for gout). Reported on 05/26/2015  0  . ferrous sulfate 325 (65 FE) MG tablet Take 1 tablet (325 mg total) by mouth 3 (three) times daily with meals. 90 tablet 2  . Fexofenadine HCl (ALLEGRA PO) Take 1 tablet by mouth daily as needed (seasonal allergies).    . fludrocortisone (FLORINEF) 0.1 MG tablet Take 1 tablet (0.1 mg total) by mouth 2 (two) times daily. 60 tablet 1  . fluticasone (FLONASE) 50 MCG/ACT nasal spray Place 2 sprays into both nostrils at bedtime as needed (seasonal allergies).     . furosemide (LASIX) 20 MG tablet Take 1 tablet (20 mg total) by mouth daily as needed for fluid. 30 tablet 1  . HYDROcodone-acetaminophen (NORCO/VICODIN) 5-325 MG tablet Take 1 tablet by mouth every 6 (six) hours as needed for moderate pain. 30 tablet 0  . hydrocortisone  (ANUSOL-HC) 25 MG suppository Place 25 mg rectally as needed for hemorrhoids or itching.    . Melatonin 10 MG CAPS Take 10 mg by mouth at bedtime as needed (sleep).     . midodrine (PROAMATINE) 2.5 MG tablet TAKE 1 TABLET (2.5 MG TOTAL) BY MOUTH 3 TIMES DAILY WITH MEALS. 90 tablet 0  . nitroGLYCERIN (NITROSTAT) 0.4 MG SL tablet Place 1 tablet (0.4 mg total) under the tongue every 5 (five) minutes as needed. For chest pain (Patient taking differently: Place 0.4 mg under the tongue every 5 (five) minutes as needed for chest  pain. ) 25 tablet 12  . pantoprazole (PROTONIX) 40 MG tablet Take 1 tablet (40 mg total) by mouth 2 (two) times daily. 60 tablet 8  . potassium chloride (K-DUR) 10 MEQ tablet Take 2 tablets (20 mEq total) by mouth 3 (three) times daily. 180 tablet 5  . PRESCRIPTION MEDICATION Inhale into the lungs at bedtime. CPAP    . rosuvastatin (CRESTOR) 40 MG tablet Take 1 tablet (40 mg total) by mouth daily. (Patient taking differently: Take 40 mg by mouth at bedtime. ) 90 tablet 3  . senna (SENOKOT) 8.6 MG TABS tablet Take 1 tablet by mouth 2 (two) times daily.    Marland Kitchen senna-docusate (SENNA S) 8.6-50 MG tablet Take 1 tablet by mouth 2 (two) times daily.    . sertraline (ZOLOFT) 50 MG tablet Take 1 tablet (50 mg total) by mouth daily. 30 tablet 11  . tiZANidine (ZANAFLEX) 4 MG tablet Take 4 mg by mouth 2 (two) times daily as needed for muscle spasms.   5   No facility-administered medications prior to visit.     PAST MEDICAL HISTORY: Past Medical History:  Diagnosis Date  . Adrenal insufficiency ([place])   . Anxiety   . Basal cell carcinoma of skin of left ankle   . Bleeding stomach ulcer 2015  . Coronary artery disease    stent, then CABG 07/20/10  . Daily headache    "for the past month" (11/11/2014)  . Depression   . Diverticulosis   . GERD (gastroesophageal reflux disease)   . Gout   . H/O cardiomyopathy    ischemic, now last echo 07/30/11, EF 38%W  . History of hiatal hernia   .  Hyperlipidemia   . Hypertension   . Internal hemorrhoids   . Left hemiplegia ([place])   . NSTEMI (non-ST elevated myocardial infarction) ([place])    NSTEMI- last cath 06/2011-Stent to LCX-DES, last nuc 07/30/11 low risk  . Obesity (BMI 30-39.9)   . OSA on CPAP    since July 2013- uses CPAP sometimes , states he has lost 147 lbs. since stroke & doesn't use the CPAP as much as he use to.   . Plantar fasciitis of left foot   . Pneumonia    hosp. 12/2014  . Pulmonary embolism ([place]) 03/2014  . Stroke ([place]) 01/2014   "no control of LUE, can slightly LLE; memory problems since" (11/11/2014)  . Tubular adenoma of colon     PAST SURGICAL HISTORY: Past Surgical History:  Procedure Laterality Date  . BASAL CELL CARCINOMA EXCISION Left 2015   ankle  . CARDIAC SURGERY    . COLONOSCOPY N/A 02/10/2013   Procedure: COLONOSCOPY;  Surgeon: Ladene Artist, MD;  Location: [facility];  Service: Endoscopy;  Laterality: N/A;  . CORONARY ANGIOPLASTY WITH STENT PLACEMENT  07/01/2011   DES-Resolute to native LCX  . CORONARY ANGIOPLASTY WITH STENT PLACEMENT  12/01/2007   COMPLEX 5 LESION PCI INCLUDING CUTTING BALLOON AND 4 CYPHER DESs  . CORONARY ARTERY BYPASS GRAFT  07/20/2010    LIMA-LAD; VG-ACUTE MARG of RCA; Seq VG-distal RCA & then pda  . ESOPHAGOGASTRODUODENOSCOPY  01/2014   gastric ulcer, erosive gastroduodenitis, H Pylori negative.   . ESOPHAGOGASTRODUODENOSCOPY (EGD) WITH PROPOFOL N/A 11/14/2014   Procedure: ESOPHAGOGASTRODUODENOSCOPY (EGD) WITH PROPOFOL;  Surgeon: Milus Banister, MD;  Location: [facility];  Service: Endoscopy;  Laterality: N/A;  . HAND SURGERY Right    to take glass out  . HERNIA REPAIR    . INTRAMEDULLARY (IM) NAIL INTERTROCHANTERIC  Right 02/07/2015   Procedure: INTRAMEDULLARY (IM) NAIL INTERTROCHANTRIC RIGHT HIP;  Surgeon: Renette Butters, MD;  Location: [facility];  Service: Orthopedics;  Laterality: Right;  . KNEE ARTHROSCOPY Right   . LEFT HEART CATHETERIZATION WITH CORONARY/GRAFT  ANGIOGRAM  07/01/2011   Procedure: LEFT HEART CATHETERIZATION WITH Beatrix Fetters;  Surgeon: Sanda Klein, MD;  Location: [facility];  Service: Cardiovascular;;  . LOOP RECORDER IMPLANT  02/15/2014   MDT LINQ implanted by Dr Rayann Heman for cryptogenic stroke  . PERCUTANEOUS CORONARY STENT INTERVENTION (PCI-S) Right 07/01/2011   Procedure: PERCUTANEOUS CORONARY STENT INTERVENTION (PCI-S);  Surgeon: Sanda Klein, MD;  Location: [facility];  Service: Cardiovascular;  Laterality: Right;  . PICC LINE PLACE PERIPHERAL ([place] HX)  06/2015  . RETINAL DETACHMENT SURGERY Right   . TEE WITHOUT CARDIOVERSION N/A 02/15/2014   Procedure: TRANSESOPHAGEAL ECHOCARDIOGRAM (TEE);  Surgeon: Josue Hector, MD;  Location: [facility];  Service: Cardiovascular;  Laterality: N/A;  . UMBILICAL HERNIA REPAIR  2006    FAMILY HISTORY: Family History  Problem Relation Age of Onset  . Diabetes type II Mother   . Coronary artery disease    . Colon cancer Neg Hx   . CAD Father 69    CABG, PCI, died at 26  . Heart attack Father 31  . Hypertension Brother   . Diabetes Brother   . Hyperlipidemia Brother   . Brain cancer Maternal Grandmother   . COPD Paternal Grandfather   . Other      denies family history of DVT/PE  . Stroke Neg Hx   . Other Neg Hx     adrenal dz    SOCIAL HISTORY:  Social History   Social History  . Marital status: Married    Spouse name: N/A  . Number of children: 5  . Years of education: N/A   Occupational History  .  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 Smoking status: Former Smoker    Packs/day: 2.00    Years: 4.00    Types: Cigarettes    Quit date: 01/30/1969  . Smokeless tobacco: Never Used     Comment: "quit smoking cigarettes  in the 1970's"  . Alcohol use No  . Drug use:     Types: Marijuana     Comment: "smoked some pot in college"  . Sexual activity: Not Currently   Other Topics Concern  . Not on file   Social  History Narrative   Quit smoking 40 years ago   Married    Likes to play guitar and sing with church group unable since 02/12/14 stroke    [employer] 1970-1972, [place], no service related issues.       PHYSICAL EXAM  Vitals:   10/12/15 1429  BP: 116/84  Pulse: 68  Resp: (!) 22  Weight: 199 lb (90.3 kg)    Body mass index is 26.99 kg/m.   General: The patient is well-developed and well-nourished and in no acute distress.  He is in a wheelchair.  Neck: The neck is supple, no carotid bruits are noted.  The neck is mildy tender   Skin: He has mild edema at the left ankle, compared to the right.    Neurologic Exam  Mental status: The patient is alert and oriented x 3 at the time of the examination. The patient has apparent normal recent and remote memory. Attention is mildly reduced.   Speech is normal.  Cranial nerves:  Extraocular movements are full. Saccadic pursuit is reduced to the left. Pupils are equal, round, and reactive to light and accomodation.     He no longer has a facial droop. There is good facial sensation to soft touch bilaterally.  Trapezius and sternocleidomastoid strength is reduced on the left.. No dysarthria is noted.  No obvious hearing deficits are noted.  Motor:  Muscle bulk is normal. He has increased tone in the left arm and leg.  Arm is held in a flexed position. This mostly involved the flexor muscles of the arm and flexors and extensors of the leg. Strength is 5/5 on the right. Strength is 2/5 in left shoulder elevation and 1/5 in proximal arm strength. Strength is 2+/5 in the quadriceps and 1/5 in other proximal muscles of the left leg  Sensory: Sensory testing is intact to pinprick, soft touch, vibration sensation, and position sense on all 4 extremities. He did not have neglect or extinction at this time.  Coordination: Cerebellar testing reveals good finger-nose-finger and heel-to-shin on the right and he cannot do these tasks on the left..  Gait and  station: Station requires unilat support. He needs strong support to walk (worse)     Reflexes: Deep tendon reflexes are increased on the left.Marland Kitchen      DIAGNOSTIC DATA (LABS, IMAGING, TESTING) - I reviewed patient records, labs, notes, testing and imaging myself where available.  Lab Results  Component Value Date   WBC 6.5 08/11/2015   HGB 11.7 (L) 08/11/2015   HCT 37.8 (L) 08/11/2015   MCV 84.8 08/11/2015   PLT 216 08/11/2015      Component Value Date/Time   NA 141 08/11/2015 1528   K 4.1 08/11/2015 1528   CL 104 08/11/2015 1528   CO2 24 08/11/2015 1528   GLUCOSE 98 08/11/2015 1528   BUN 15 08/11/2015 1528   CREATININE 0.99 08/11/2015 1528   CALCIUM 9.0 08/11/2015 1528   PROT 7.5 07/24/2015 1233   ALBUMIN 3.5 07/24/2015 1233   AST 21 07/24/2015 1233   ALT 23 07/24/2015 1233   ALKPHOS 53 07/24/2015 1233   BILITOT 0.4 07/24/2015 1233   GFRNONAA >60 07/18/2015 0637   GFRNONAA >89 09/13/2014 1032   GFRAA >60 07/18/2015 0637   GFRAA >89 09/13/2014 1032   Lab Results  Component Value Date   CHOL 130 08/11/2015   HDL 49 08/11/2015   LDLCALC 66 08/11/2015   TRIG 75 08/11/2015   CHOLHDL 2.7 08/11/2015   Lab Results  Component Value Date   HGBA1C 5.6 02/13/2014   Lab Results  Component Value Date   VITAMINB12 286 07/05/2015   Lab Results  Component Value Date   TSH 1.035 12/23/2014        ASSESSMENT AND PLAN  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   Due to worsening gait, we will check a MRI of the brain and MRI of the cervical spine. The fall was not associated with loss of  consciousness but we need to make sure that there was no or any evidence of a SDH and also to rule out myelopathy. 2.  We discuss making sure at he walks around the house safely and not go further than he feels safe doing.     3.   He is advised to use his CPAP on a regular basis for his obstructive sleep apnea. 4.    He will return to see me in 6 months or sooner if he has new or worsening neurologic symptoms.    Richard A. Felecia Shelling, MD, PhD [id number], [id number] PM Certified in Neurology, Clinical Neurophysiology, Sleep Medicine, Pain Medicine and Neuroimaging  [facility] [address], [place], [place] [address] [phone]

## 2015-10-17 LAB — CUP PACEART REMOTE DEVICE CHECK: Date Time Interrogation Session: 20170724020527

## 2015-10-18 ENCOUNTER — Encounter: Payer: Self-pay | Admitting: Cardiology

## 2015-10-24 ENCOUNTER — Other Ambulatory Visit: Payer: Self-pay | Admitting: Cardiovascular Disease

## 2015-11-02 ENCOUNTER — Other Ambulatory Visit: Payer: Self-pay | Admitting: Endocrinology

## 2015-11-03 ENCOUNTER — Other Ambulatory Visit: Payer: Self-pay | Admitting: Endocrinology

## 2015-11-03 ENCOUNTER — Other Ambulatory Visit: Payer: Self-pay

## 2015-11-03 NOTE — Telephone Encounter (Signed)
Last filled on 09/07/15 by dr. Loanne Drilling, last OV 08/11/15. Ok to refill? Dr. Loanne Drilling won't refill it since pt established with you. Thanks  [employer].

## 2015-11-05 MED ORDER — MIDODRINE HCL 2.5 MG PO TABS: 2.5000 mg | ORAL_TABLET | Freq: Three times a day (TID) | ORAL | 3 refills | Status: DC

## 2015-11-05 NOTE — Telephone Encounter (Signed)
Sent. Thanks.  His is his BP at home?  How is he feeling? Let me know so we can make some plans.  Thanks.

## 2015-11-06 NOTE — Telephone Encounter (Signed)
BP's ~ 108/60.  Patient has had a few falls and is complaining with his left hip hurting.  Patient has MRI of head and upper spine scheduled through Neurology for next Monday or Tuesday.  Has appt scheduled here on Thursday of this week.

## 2015-11-06 NOTE — Telephone Encounter (Signed)
Noted. Thanks.

## 2015-11-07 ENCOUNTER — Encounter: Payer: Medicare Other | Admitting: *Deleted

## 2015-11-09 ENCOUNTER — Encounter: Payer: Self-pay | Admitting: Family Medicine

## 2015-11-09 ENCOUNTER — Ambulatory Visit (INDEPENDENT_AMBULATORY_CARE_PROVIDER_SITE_OTHER)
Admission: RE | Admit: 2015-11-09 | Discharge: 2015-11-09 | Disposition: A | Discharge status: Home / Self Care | Payer: Medicare Other | Source: Ambulatory Visit | Attending: Family Medicine | Admitting: Family Medicine

## 2015-11-09 ENCOUNTER — Ambulatory Visit (INDEPENDENT_AMBULATORY_CARE_PROVIDER_SITE_OTHER): Payer: Medicare Other | Admitting: Family Medicine

## 2015-11-09 ENCOUNTER — Telehealth: Payer: Self-pay

## 2015-11-09 VITALS — BP 104/76 | HR 75 | Temp 97.5°F | Wt 201.0 lb

## 2015-11-09 DIAGNOSIS — I69952 Hemiplegia and hemiparesis following unspecified cerebrovascular disease affecting left dominant side: Secondary | ICD-10-CM

## 2015-11-09 DIAGNOSIS — D649 Anemia, unspecified: Secondary | ICD-10-CM

## 2015-11-09 DIAGNOSIS — M542 Cervicalgia: Secondary | ICD-10-CM | POA: Diagnosis not present

## 2015-11-09 DIAGNOSIS — R938 Abnormal findings on diagnostic imaging of other specified body structures: Secondary | ICD-10-CM

## 2015-11-09 DIAGNOSIS — M79605 Pain in left leg: Secondary | ICD-10-CM

## 2015-11-09 DIAGNOSIS — M79652 Pain in left thigh: Secondary | ICD-10-CM

## 2015-11-09 DIAGNOSIS — Z23 Encounter for immunization: Secondary | ICD-10-CM

## 2015-11-09 DIAGNOSIS — R9389 Abnormal findings on diagnostic imaging of other specified body structures: Secondary | ICD-10-CM

## 2015-11-09 DIAGNOSIS — D509 Iron deficiency anemia, unspecified: Secondary | ICD-10-CM | POA: Diagnosis not present

## 2015-11-09 LAB — CBC WITH DIFFERENTIAL/PLATELET
Basophils Absolute: 0 10*3/uL (ref 0.0–0.1)
Basophils Relative: 0.7 % (ref 0.0–3.0)
Eosinophils Absolute: 0.3 10*3/uL (ref 0.0–0.7)
Eosinophils Relative: 5 % (ref 0.0–5.0)
HCT: 41.1 % (ref 39.0–52.0)
Hemoglobin: 13.9 g/dL (ref 13.0–17.0)
Lymphocytes Relative: 27 % (ref 12.0–46.0)
Lymphs Abs: 1.4 10*3/uL (ref 0.7–4.0)
MCHC: 33.8 g/dL (ref 30.0–36.0)
MCV: 84.5 fl (ref 78.0–100.0)
Monocytes Absolute: 0.3 10*3/uL (ref 0.1–1.0)
Monocytes Relative: 6.3 % (ref 3.0–12.0)
Neutro Abs: 3.2 10*3/uL (ref 1.4–7.7)
Neutrophils Relative %: 61 % (ref 43.0–77.0)
Platelets: 167 10*3/uL (ref 150.0–400.0)
RBC: 4.87 Mil/uL (ref 4.22–5.81)
RDW: 14.6 % (ref 11.5–15.5)
WBC: 5.2 10*3/uL (ref 4.0–10.5)

## 2015-11-09 LAB — BASIC METABOLIC PANEL
BUN: 15 mg/dL (ref 6–23)
CO2: 30 mEq/L (ref 19–32)
Calcium: 8.9 mg/dL (ref 8.4–10.5)
Chloride: 105 mEq/L (ref 96–112)
Creatinine, Ser: 0.88 mg/dL (ref 0.40–1.50)
GFR: 92.12 mL/min (ref >60.00)
Glucose, Bld: 96 mg/dL (ref 70–99)
Potassium: 4.2 mEq/L (ref 3.5–5.1)
Sodium: 139 mEq/L (ref 135–145)

## 2015-11-09 LAB — IBC PANEL
Iron: 61 ug/dL (ref 42–165)
Saturation Ratios: 21.3 % (ref 20.0–50.0)
Transferrin: 205 mg/dL — ABNORMAL LOW (ref 212.0–360.0)

## 2015-11-09 NOTE — Telephone Encounter (Signed)
Call pt.  End of the femur at the knee doesn't look normal.  Unclear cause.  This could be from infection but if that were the case I would expect him to have fever, etc.  Needs CT w/o contract to eval.  I put in the order.  If fever, to ER.  Thanks .

## 2015-11-09 NOTE — Telephone Encounter (Signed)
[facility] called report on femur; report in epic and copy taken around to Dr Damita Dunnings.

## 2015-11-09 NOTE — Progress Notes (Signed)
Anemia.  Due for labs.  Still anticoagulated.  No bleeding.  He wanted to know if he could stop iron at this point.    L thigh pain.  He is leery of putting weight on the leg.  Pain at lateral L mid thigh, but not ttp.  Sig pain with weight bearing.  Not acute, ongoing pain.  Noted after a fall weeks ago.    Dr. Felecia Shelling wanted to get C spine MRI and brain MRI, both pending.   Tetanus done 07/2010 per patient report, at time of CABG.    Wife is schedule for surgery, for 12/20/15, for fibroidectomy.   He'll need inpatient rehab.  He can't care for self at home.  H/o falls.  He'll need help while she is recovering, 4-6 weeks.  He needs a letter stating this.  He'll need PT and OT.    Can minimally raise L arm but no movement L hand.  No movement L lower ext.  He has had more troubles with swallowing his saliva and foods.  No fevers.     He needs a wheelchair that fits him with gel cushion to prevent skin breakdown.    PMH and SH reviewed  ROS: Per HPI unless specifically indicated in ROS section   Meds, vitals, and allergies reviewed.   GEN: nad, alert and oriented HEENT: mucous membranes moist NECK: supple w/o LA CV: rrr. PULM: ctab, no inc wob ABD: soft, +bs EXT: no edema SKIN: no acute rash No movement in L hand or L leg, at baseline.  L thigh isn't ttp

## 2015-11-09 NOTE — Progress Notes (Signed)
Pre visit review using our clinic review tool, if applicable. No additional management support is needed unless otherwise documented below in the visit note. 

## 2015-11-09 NOTE — Telephone Encounter (Signed)
Caretaker advised.

## 2015-11-09 NOTE — Patient Instructions (Addendum)
Get a name and address for me to send in the letter about inpatient rehab.   I'll work on your papers in the meantime. Go to the lab on the way out.  We'll contact you with your lab and xray reports.  Take care.  Glad to see you.

## 2015-11-10 ENCOUNTER — Ambulatory Visit (INDEPENDENT_AMBULATORY_CARE_PROVIDER_SITE_OTHER)
Admission: RE | Admit: 2015-11-10 | Discharge: 2015-11-10 | Disposition: A | Discharge status: Home / Self Care | Payer: Medicare Other | Source: Ambulatory Visit | Attending: Family Medicine | Admitting: Family Medicine

## 2015-11-10 ENCOUNTER — Telehealth: Payer: Self-pay | Admitting: *Deleted

## 2015-11-10 DIAGNOSIS — R938 Abnormal findings on diagnostic imaging of other specified body structures: Secondary | ICD-10-CM

## 2015-11-10 DIAGNOSIS — M79652 Pain in left thigh: Secondary | ICD-10-CM | POA: Insufficient documentation

## 2015-11-10 DIAGNOSIS — D649 Anemia, unspecified: Secondary | ICD-10-CM | POA: Insufficient documentation

## 2015-11-10 DIAGNOSIS — R9389 Abnormal findings on diagnostic imaging of other specified body structures: Secondary | ICD-10-CM

## 2015-11-10 NOTE — Assessment & Plan Note (Signed)
See notes on imaging. 

## 2015-11-10 NOTE — Telephone Encounter (Signed)
Thanks

## 2015-11-10 NOTE — Assessment & Plan Note (Signed)
See notes on labs.  We'll see about stopping iron based on his results.

## 2015-11-10 NOTE — Assessment & Plan Note (Signed)
See following phone note.  Will get noncontrast CT L knee.  Unclear dx at this point.  I don't suspect septic joint given the absence of fever and the lack of knee pain.

## 2015-11-10 NOTE — Assessment & Plan Note (Signed)
MRI pending.

## 2015-11-10 NOTE — Telephone Encounter (Signed)
Changed the location to [place] CT. They will do it today at 2:30pm. Will call [facility] and cancel the Sept 6th appt. Patients wife aware of CT today at [facility].

## 2015-11-10 NOTE — Assessment & Plan Note (Signed)
He'll need inpatient rehab.  He can't care for self at home.  H/o falls.  He'll need help while wife is recovering from surgery, 4-6 weeks.  He'll need PT and OT.  Can minimally raise L arm but no movement L hand.  No movement L lower ext.  He has had more troubles with swallowing his saliva and foods.  No fevers.   >40 minutes spent in face to face time with patient, >50% spent in counselling or coordination of care.

## 2015-11-10 NOTE — Telephone Encounter (Signed)
Patient's wife phoned in this morning saying that the imaging center called her yesterday afternoon after I spoke with her and they say they can't get him in until September 5th or 6th.  Is there any way they could get in sooner at the hospital or somewhere?  Wife says she doesn't think you want him to wait 2 weeks for the additional imaging.

## 2015-11-10 NOTE — Telephone Encounter (Signed)
Can he be scheduled elsewhere sooner?  Routed to [facility] for input.  Please let me and the patient know.

## 2015-11-13 ENCOUNTER — Ambulatory Visit (HOSPITAL_COMMUNITY)
Admission: RE | Admit: 2015-11-13 | Discharge: 2015-11-13 | Disposition: A | Discharge status: Home / Self Care | Payer: Medicare Other | Source: Ambulatory Visit | Attending: Neurology | Admitting: Neurology

## 2015-11-13 ENCOUNTER — Telehealth: Payer: Self-pay

## 2015-11-13 ENCOUNTER — Telehealth: Payer: Self-pay | Admitting: *Deleted

## 2015-11-13 DIAGNOSIS — R269 Unspecified abnormalities of gait and mobility: Secondary | ICD-10-CM

## 2015-11-13 DIAGNOSIS — M542 Cervicalgia: Secondary | ICD-10-CM | POA: Diagnosis present

## 2015-11-13 DIAGNOSIS — M2578 Osteophyte, vertebrae: Secondary | ICD-10-CM | POA: Diagnosis not present

## 2015-11-13 DIAGNOSIS — I63511 Cerebral infarction due to unspecified occlusion or stenosis of right middle cerebral artery: Secondary | ICD-10-CM | POA: Diagnosis not present

## 2015-11-13 DIAGNOSIS — I63411 Cerebral infarction due to embolism of right middle cerebral artery: Secondary | ICD-10-CM

## 2015-11-13 NOTE — Telephone Encounter (Signed)
-----   Message from Britt Bottom, MD sent at 11/13/2015  4:48 PM EDT ----- Please let him know that the MRI of the brain shows the old stroke but nothing looks new compared to the last MRI. The MRI of the cervical spine shows mild arthritis but nothing that would affect walking or strength.

## 2015-11-13 NOTE — Telephone Encounter (Signed)
Gregory Wiley with [facility] left v/m that received order for w/c and Gregory Wiley will fax to [facility] needed paperwork to be filled out. FYI to Dr Damita Dunnings,

## 2015-11-13 NOTE — Telephone Encounter (Signed)
Noted. Thanks.  I'll work on the hard copy.

## 2015-11-13 NOTE — Telephone Encounter (Signed)
I have spoken with Gregory Wiley and per RAS, advised that [facility] MRI brain showed the old stroke but nothing new, and the MRI cervical spine showed mild arthritis, but nothing that would affect is walking or strength.  She verbalized understanding of same/fim

## 2015-11-16 ENCOUNTER — Telehealth: Payer: Self-pay | Admitting: Cardiology

## 2015-11-16 NOTE — Telephone Encounter (Signed)
Certified letter mailed and returned signed. Home monitor still not updating, un enrolled from [employer] and return kit ordered.  

## 2015-11-17 ENCOUNTER — Encounter: Payer: Self-pay | Admitting: Cardiovascular Disease

## 2015-11-17 ENCOUNTER — Ambulatory Visit (INDEPENDENT_AMBULATORY_CARE_PROVIDER_SITE_OTHER): Payer: Medicare Other | Admitting: Cardiovascular Disease

## 2015-11-17 VITALS — BP 98/66 | HR 74 | Ht 72.0 in | Wt 200.2 lb

## 2015-11-17 DIAGNOSIS — I2510 Atherosclerotic heart disease of native coronary artery without angina pectoris: Secondary | ICD-10-CM

## 2015-11-17 DIAGNOSIS — Z951 Presence of aortocoronary bypass graft: Secondary | ICD-10-CM | POA: Diagnosis not present

## 2015-11-17 DIAGNOSIS — Z7901 Long term (current) use of anticoagulants: Secondary | ICD-10-CM | POA: Diagnosis not present

## 2015-11-17 DIAGNOSIS — G4733 Obstructive sleep apnea (adult) (pediatric): Secondary | ICD-10-CM

## 2015-11-17 NOTE — Progress Notes (Signed)
Patient ID: Gregory Wiley, male   DOB: Jun 11, 1950, 65 y.o.   MRN: [id number]    Primary MD: Dr. [name]  HPI: Gregory Wiley is a 65 y.o. male presents to the office today for 6 month follow-up cardiology evaluation. He is a former patient of Dr. Rex Kras.  [name] has established CAD and underwent stenting of his RCA in May 2011. He is s/p CABG surgery with LIMA to the LAD, sequential vein to the distal RCA and PDA, and vein to the acute marginal branch of his right coronary artery. In April 2013 he developed unstable angina and ruled in for non-ST segment elevation myocardial infarction. Catheterization revealed 99% stenosis of the large left circumflex vessel and he underwent successful stenting with insertion of a 4.0x18 mm resolute DES stent post dilated to 4.5 mm.  Additional problems include obstructive sleep apnea. On his initial polysomnogram in 2010 AHI was 11.3 overall, but during REM sleep he had moderate sleep apnea with an AHI of 24.8. He never did initiate CPAP therapy at that time. However, since his myocardial infarction he has been using CPAP therapy since July 2013.  When I last saw him, he was only utilizing CPAP intermittently.  He now admits to much more frequent use but there are times when he does not use treatment.  He has a history of hyperlipidemia, hypertension, and morbid obesity. An echo Doppler study in May 2013 suggested an ejection fraction of 38% and on nuclear imaging  was 37% with scar in the LAD territory.  He was admitted to [facility] in November 2015 with a large right MCA territory CVA.  TEE was negative for embolic source.  EF was 45% on echocardiogram.  He underwent implantable loop recorder by Dr. Rayann Heman. He was readmitted with bilateral submassive pulmonary embolism.  Venous Dopplers were positive for DVT.  Ejection fraction was 25-30%, which is felt to be contributed by strain from his pulmonary emboli.  He had mild troponin elevation which was felt  to be due to demand ischemia and has been on Xarelto for anticoagulation.  He was admitted on the [facility] hospitalist service on 11/11/2014 with dehydration.  Several of his medications were held.  I reviewed his hospital record.  He had a history of nausea and vomiting that had been going on for approximatey a month.  He has continued to feel weak and in his words, "feels terrible" since his hospitalization.  He has had issues with headaches which he did see neurology.  He has not been eating well.  He seeing Dr. Damita Dunnings at [facility] for primary care.  He denies chest pressure.  He denies fevers.  He is on Xarelto anticoagulation and denies overt bleeding.  There is a remote history of erosive gastroduodenitis, GERD, esophageal stricture for which in the past he had been seen by Dr. Fuller Plan.  When I previously saw him, he was tachycardic which I felt was contributed by his Ritalin.  He has since been off this medication.  Presently, he denies any awareness of tachypalpitations.  Since I last saw him, he underwent his tooth extraction and had held his Xarelto for this procedure.  He denies any anginal episodes.  He denies presyncope or syncope.  He is followed by Dr. Damita Dunnings at [facility] for primary care.  Past Medical History:  Diagnosis Date  . Adrenal insufficiency ([place])   . Anxiety   . Basal cell carcinoma of skin of left ankle   . Bleeding  stomach ulcer 2015  . Coronary artery disease    stent, then CABG 07/20/10  . Daily headache    "for the past month" (11/11/2014)  . Depression   . Diverticulosis   . GERD (gastroesophageal reflux disease)   . Gout   . H/O cardiomyopathy    ischemic, now last echo 07/30/11, EF 38%W  . History of hiatal hernia   . Hyperlipidemia   . Hypertension   . Internal hemorrhoids   . Left hemiplegia ([place])   . NSTEMI (non-ST elevated myocardial infarction) ([place])    NSTEMI- last cath 06/2011-Stent to LCX-DES, last nuc 07/30/11 low risk  . Obesity (BMI 30-39.9)   .  OSA on CPAP    since July 2013- uses CPAP sometimes , states he has lost 147 lbs. since stroke & doesn't use the CPAP as much as he use to.   . Plantar fasciitis of left foot   . Pneumonia    hosp. 12/2014  . Pulmonary embolism ([place]) 03/2014  . Stroke ([place]) 01/2014   "no control of LUE, can slightly LLE; memory problems since" (11/11/2014)  . Tubular adenoma of colon     Past Surgical History:  Procedure Laterality Date  . BASAL CELL CARCINOMA EXCISION Left 2015   ankle  . CARDIAC SURGERY    . COLONOSCOPY N/A 02/10/2013   Procedure: COLONOSCOPY;  Surgeon: Ladene Artist, MD;  Location: [facility];  Service: Endoscopy;  Laterality: N/A;  . CORONARY ANGIOPLASTY WITH STENT PLACEMENT  07/01/2011   DES-Resolute to native LCX  . CORONARY ANGIOPLASTY WITH STENT PLACEMENT  12/01/2007   COMPLEX 5 LESION PCI INCLUDING CUTTING BALLOON AND 4 CYPHER DESs  . CORONARY ARTERY BYPASS GRAFT  07/20/2010    LIMA-LAD; VG-ACUTE MARG of RCA; Seq VG-distal RCA & then pda  . ESOPHAGOGASTRODUODENOSCOPY  01/2014   gastric ulcer, erosive gastroduodenitis, H Pylori negative.   . ESOPHAGOGASTRODUODENOSCOPY (EGD) WITH PROPOFOL N/A 11/14/2014   Procedure: ESOPHAGOGASTRODUODENOSCOPY (EGD) WITH PROPOFOL;  Surgeon: Milus Banister, MD;  Location: [place];  Service: Endoscopy;  Laterality: N/A;  . HAND SURGERY Right    to take glass out  . HERNIA REPAIR    . INTRAMEDULLARY (IM) NAIL INTERTROCHANTERIC Right 02/07/2015   Procedure: INTRAMEDULLARY (IM) NAIL INTERTROCHANTRIC RIGHT HIP;  Surgeon: Renette Butters, MD;  Location: [place];  Service: Orthopedics;  Laterality: Right;  . KNEE ARTHROSCOPY Right   . LEFT HEART CATHETERIZATION WITH CORONARY/GRAFT ANGIOGRAM  07/01/2011   Procedure: LEFT HEART CATHETERIZATION WITH Beatrix Fetters;  Surgeon: Sanda Klein, MD;  Location: [place] CATH LAB;  Service: Cardiovascular;;  . LOOP RECORDER IMPLANT  02/15/2014   MDT LINQ implanted by Dr Rayann Heman for cryptogenic stroke  .  PERCUTANEOUS CORONARY STENT INTERVENTION (PCI-S) Right 07/01/2011   Procedure: PERCUTANEOUS CORONARY STENT INTERVENTION (PCI-S);  Surgeon: Sanda Klein, MD;  Location: [facility] C[facility] LAB;  Service: Cardiovascular;  Laterality: Right;  . PICC LINE PLACE PERIPHERAL ([place] HX)  06/2015  . RETINAL DETACHMENT SURGERY Right   . TEE WITHOUT CARDIOVERSION N/A 02/15/2014   Procedure: TRANSESOPHAGEAL ECHOCARDIOGRAM (TEE);  Surgeon: Josue Hector, MD;  Location: [place];  Service: Cardiovascular;  Laterality: N/A;  . UMBILICAL HERNIA REPAIR  2006    Allergies  Allergen Reactions  . Diphenhydramine Hcl Anaphylaxis  . Adhesive [Tape] Other (See Comments)    Tears skin - please use paper tape    Current Outpatient Prescriptions  Medication Sig Dispense Refill  . acetaminophen (TYLENOL) 325 MG tablet Take 1 tablet (325 mg  total) by mouth every 6 (six) hours as needed for mild pain.    Marland Kitchen ALPRAZolam (XANAX) 0.25 MG tablet Take 0.5 tablets (0.125 mg total) by mouth every 8 (eight) hours as needed for anxiety. 30 tablet 0  . apixaban (ELIQUIS) 5 MG TABS tablet Take 1 tablet (5 mg total) by mouth 2 (two) times daily. Start taking May 8th, 2017. 60 tablet 11  . aspirin 81 MG EC tablet Take 1 tablet by mouth daily 30 tablet 12  . Fexofenadine HCl (ALLEGRA PO) Take 1 tablet by mouth daily as needed (seasonal allergies).    . fludrocortisone (FLORINEF) 0.1 MG tablet Take 1 tablet (0.1 mg total) by mouth 2 (two) times daily. 60 tablet 1  . fluticasone (FLONASE) 50 MCG/ACT nasal spray Place 2 sprays into both nostrils at bedtime as needed (seasonal allergies).     . hydrocortisone (ANUSOL-HC) 25 MG suppository Place 25 mg rectally as needed for hemorrhoids or itching.    . Melatonin 10 MG CAPS Take 10 mg by mouth at bedtime as needed (sleep).     . midodrine (PROAMATINE) 2.5 MG tablet Take 1 tablet (2.5 mg total) by mouth 3 (three) times daily with meals. 90 tablet 3  . nitroGLYCERIN (NITROSTAT) 0.4 MG SL tablet  Place 1 tablet (0.4 mg total) under the tongue every 5 (five) minutes as needed. For chest pain (Patient taking differently: Place 0.4 mg under the tongue every 5 (five) minutes as needed for chest pain. ) 25 tablet 12  . pantoprazole (PROTONIX) 40 MG tablet Take 1 tablet (40 mg total) by mouth 2 (two) times daily. 60 tablet 8  . potassium chloride (K-DUR) 10 MEQ tablet Take 2 tablets (20 mEq total) by mouth 3 (three) times daily. 180 tablet 5  . PRESCRIPTION MEDICATION Inhale into the lungs at bedtime. CPAP    . rosuvastatin (CRESTOR) 40 MG tablet Take 1 tablet (40 mg total) by mouth daily. (Patient taking differently: Take 40 mg by mouth at bedtime. ) 90 tablet 3  . senna (SENOKOT) 8.6 MG TABS tablet Take 1 tablet by mouth 2 (two) times daily.    Marland Kitchen senna-docusate (SENNA S) 8.6-50 MG tablet Take 1 tablet by mouth 2 (two) times daily.    . sertraline (ZOLOFT) 50 MG tablet Take 1 tablet (50 mg total) by mouth daily. 30 tablet 11   No current facility-administered medications for this visit.     Social History   Social History  . Marital status: Married    Spouse name: N/A  . Number of children: 5  . Years of education: N/A   Occupational History  .  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 Smoking status: Former Smoker    Packs/day: 2.00    Years: 4.00    Types: Cigarettes    Quit date: 01/30/1969  . Smokeless tobacco: Never Used     Comment: "quit smoking cigarettes  in the 1970's"  . Alcohol use No  . Drug use:     Types: Marijuana     Comment: "smoked some pot in college"  . Sexual activity: Not Currently   Other Topics Concern  . 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      Family  History  Problem Relation Age of Onset  . Diabetes type II Mother   . CAD Father 47     CABG, PCI, died at 70  . Heart attack Father 68  . Hypertension Brother   . Diabetes Brother   . Hyperlipidemia Brother   . Brain cancer Maternal Grandmother   . COPD Paternal Grandfather   . Coronary artery disease    . Other      denies family history of DVT/PE  . Colon cancer Neg Hx   . Stroke Neg Hx    Additional social history is notable in that he is married has 5 children 4 grandchildren. He does try to walk. There is no tobacco or alcohol use.  ROS General: Negative; No fevers, chills, or night sweats;  HEENT: Negative; No changes in vision or hearing, sinus congestion, difficulty swallowing Pulmonary: Negative; No cough, wheezing, shortness of breath, hemoptysis Cardiovascular: see HPI GI: History of recent nausea and vomiting. GU: Negative; No dysuria, hematuria, or difficulty voiding Musculoskeletal: Negative; no myalgias, joint pain, or weakness Hematologic/Oncology: Negative; no easy bruising, bleeding Endocrine: Negative; no heat/cold intolerance; no diabetes Neuro: Right MCA stroke with residual left-sided weakness Skin: Negative; No rashes or skin lesions Psychiatric: Negative; No behavioral problems, depression Sleep: Positive for sleep apnea on CPAP; No snoring, daytime sleepiness, hypersomnolence, bruxism, restless legs, hypnogognic hallucinations, no cataplexy Other comprehensive 14 point system review is negative.  PE BP 98/66 (BP Location: Right Arm, Patient Position: Sitting, Cuff Size: Normal)   Pulse 74   Ht 6' (1.829 m)   Wt 200 lb 4 oz (90.8 kg)   BMI 27.16 kg/m    Repeat blood pressure by me was 120/78.  Wt Readings from Last 3 Encounters:  11/17/15 200 lb 4 oz (90.8 kg)  11/09/15 201 lb (91.2 kg)  10/12/15 199 lb (90.3 kg)   General: Alert, oriented, no distress.  Skin: normal turgor, no rashes HEENT: Normocephalic, atraumatic. Pupils round and reactive; sclera anicteric;no lid lag.  Nose without nasal septal hypertrophy Mouth/Parynx  benign; Mallinpatti scale 3/4 Neck: Thick neck; No JVD, no carotid briuts Lungs: clear to ausculatation and percussion; no wheezing or rales Heart: RRR, s1 s2 normal 1/6 sem; no diastolic murmur.  No rubs thrills or heaves. Abdomen: soft, nontender; no hepatosplenomehaly, BS+; abdominal aorta nontender and not dilated by palpation. Pulses 2+ Extremities: no clubbing cyanosis or edema, Homan's sign negative  Neurologic: Residual hemiparesis on the left. Psychologic: normal affect and mood.  ECG (independently read by me): Normal sinus rhythm at 74 bpm.  QRS complex V1 through V3.  Previously noted lateral ST changes.  Normal intervals.  March 2017 ECG (independently read by me):  Normal sinus rhythm at 61 bpm. Poor R wave progression. Lateral ST segment changes.  QTc interval 469 ms  September 2016 ECG (independently read by me): Sinus tachycardia 1 18 bpm.  Left axis deviation.  Cannot rule out old anterolateral infarct.  ECG (independently read by me): Normal sinus rhythm at 93 bpm.  QS complex anteriorly compatible with old anterior MI  April 2016 ECG (independently read by me): Normal sinus rhythm at 79 bpm.  Old anteroseptal MI with QS complex V1 through V3.  LVH by voltage criteria in aVL.  Increased QTc interval 488 ms.  ECG: Normal sinus rhythm at 60. QS complex in V1 through V3 concordant with his documented LAD region scar. T-wave inversion in leads 1 and avL unchanged.   LABS:  BMP Latest Ref Rng & Units  11/09/2015 08/11/2015 07/24/2015  Glucose 70 - 99 mg/dL 96 98 110(H)  BUN 6 - 23 mg/dL _0 Creatinine 0.40 - 1.50 mg/dL 0.88 0.99 0.88  Sodium 135 - 145 mEq/L 139 141 138  Potassium 3.5 - 5.1 mEq/L 4.2 4.1 3.4(L)  Chloride 96 - 112 mEq/L 105 104 105  CO2 19 - 32 mEq/L _1 Calcium 8.4 - 10.5 mg/dL 8.9 9.0 9.0    Hepatic Function Latest Ref Rng & Units 07/24/2015 07/02/2015 07/01/2015  Total Protein 6.0 - 8.3 g/dL 7.5 6.4(L) 7.0  Albumin 3.5 - 5.2 g/dL 3.5 3.1(L)  3.4(L)  AST 0 - 37 U/L _2 ALT 0 - 53 U/L 23 14(L) 15(L)  Alk Phosphatase 39 - 117 U/L 53 50 53  Total Bilirubin 0.2 - 1.2 mg/dL 0.4 0.6 0.5    CBC Latest Ref Rng & Units 11/09/2015 08/11/2015 07/24/2015  WBC 4.0 - 10.5 K/uL 5.2 6.5 5.3  Hemoglobin 13.0 - 17.0 g/dL 13.9 11.7(L) 10.2(L)  Hematocrit 39.0 - 52.0 % 41.1 37.8(L) 31.6(L)  Platelets 150.0 - 400.0 K/uL 167.0 216 234.0   Lab Results  Component Value Date   MCV 84.5 11/09/2015   MCV 84.8 08/11/2015   MCV 81.3 07/24/2015   Lab Results  Component Value Date   TSH 1.035 12/23/2014   Lab Results  Component Value Date   HGBA1C 5.6 02/13/2014    BNP    Component Value Date/Time   PROBNP 1,224.0 (H) 06/30/2011 0202    Lipid Panel     Component Value Date/Time   CHOL 130 08/11/2015 1528   CHOL 199 01/04/2013 1206   TRIG 75 08/11/2015 1528   TRIG 113 01/04/2013 1206   HDL 49 08/11/2015 1528   HDL 47 01/04/2013 1206   CHOLHDL 2.7 08/11/2015 1528   VLDL 15 08/11/2015 1528   LDLCALC 66 08/11/2015 1528   LDLCALC 129 (H) 01/04/2013 1206     RADIOLOGY: No results found.    ASSESSMENT AND PLAN: Mr. Saber is a 66 year old gentleman with a history of morbid obesity, hypertension, known coronary artery disease who is status post stenting of his RCA in May 2011 with subsequent CABG surgery. In April 2013 he was found to have subtotal stenosis in a very large circumflex vessel and underwent successful insertion of a large Resolute DES stent. His last nuclear perfusion study in May 2013  revealed an ejection fraction approximately 37%.   He has suffered a large right MCA CVA.  He has implantable loop recorder to assess for potential atrial fibrillation.  He subsequently suffered bilateral submassive pulmonary emboli and has developed a cardiomyopathy with a drop in ejection fraction to 25-30% noted at that time.   A follow-up CT revealed a chronic filling defect in the distal left vertebral artery compatible with soft  plaque or thromboembolic disease.  He has soft calcified plaque in the right vertebral artery region.  He had improved right middle cerebral artery.  patency since November 2015 without intracranial arterial occlusion.  There was evidence for his chronic right MCA infarct without acute intracranial abnormality.  Presently, although his blood pressure was mildly low with taken by the nurse on repeat was within normal limits.  He continues to be on eloquence without bleeding and also low-dose baby aspirin with his concomitant CAD.  His GERD is controlled with pantoprazole.  He continues to be on rosuvastatin 40 mg for hyperlipidemia.  In May 2017.  LDL was excellent at 66  with triglycerides at 75 and total cholesterol of 1:30.  I discussed the importance of meeting 100% compliance with his CPAP use.  He now has a different mask which has improved his use but at times she still notes movement of the mask such that he feels air by his eyes leading to him taking the mask off.  He'll continue to monitor blood pressure.  He is not having any palpitations.  Presently, despite no longer taking carvedilol.  I will see him in 6 months for reevaluation.  Time spent: 25 minutes  [name], MD, [facility]  11/17/2015 1:23 PM

## 2015-11-17 NOTE — Patient Instructions (Signed)
Your physician wants you to follow-up in: 6 months or sooner if needed. You will receive a reminder letter in the mail two months in advance. If you don't receive a letter, please call our office to schedule the follow-up appointment.   If you need a refill on your cardiac medications before your next appointment, please call your pharmacy. 

## 2015-11-21 ENCOUNTER — Telehealth: Payer: Self-pay | Admitting: Family Medicine

## 2015-11-21 NOTE — Telephone Encounter (Signed)
Pt wife called after hearing back from insurance company about inpatient rehab due to history of falls- ins said Dr. Damita Dunnings needs to call provider number [phone] to get approval. He needs to be admitted 10/2 for 6 wks as written by Dr. Damita Dunnings previously. Ins stated they will pay as long as he is improving. Pt is waiting for approval to move forward.

## 2015-11-22 ENCOUNTER — Other Ambulatory Visit: Payer: Medicare Other

## 2015-11-22 NOTE — Telephone Encounter (Signed)
I called and talked to insurance co.  After a long discussion, I was told that pt/wife will need to select a facility and then have the facility call for approval.  The facility will need to list me as the ordering MD and give my contact information.   The facility will need to call the direct line [phone], option #3.   Thanks.

## 2015-11-22 NOTE — Telephone Encounter (Signed)
Patient's wife notified as instructed by telephone and verbalized understanding. 

## 2015-11-30 ENCOUNTER — Telehealth: Payer: Self-pay

## 2015-11-30 ENCOUNTER — Telehealth: Payer: Self-pay | Admitting: Family Medicine

## 2015-11-30 DIAGNOSIS — M25559 Pain in unspecified hip: Secondary | ICD-10-CM

## 2015-11-30 NOTE — Telephone Encounter (Signed)
Electronic refill request. Last Filled:     60 tablet 1 10/06/2015  Please advise.

## 2015-11-30 NOTE — Telephone Encounter (Signed)
[name] left v/m(DPR signed); pt still having hip and leg pain; pt has had xrays and CT scans and [name] request referral to Dr Percell Miller at [facility] [place]. [name] also wants to know if Nira Conn at [facility] has contacted Dr Damita Dunnings. [name] request cb.

## 2015-11-30 NOTE — Telephone Encounter (Signed)
Ivin Booty Ward RN case mgr with [facility] left v/m; Ivin Booty was talking with pts wife; pt is continuing with pain (pain level 10 out of 10) on lt side when standing and walking. Ivin Booty advised Mrs Hennick to contact [facility] to assist pt with Dr Percell Miller Orthopedic appt. Ivin Booty also request cb about a note Ivin Booty read in a hospitalization chart from 01/2015 when pt had rt femur fx that pt has multiple myeolomas.

## 2015-12-01 NOTE — Telephone Encounter (Signed)
Gregory Wiley notified that referral has been placed but no word from [facility] at [facility].

## 2015-12-01 NOTE — Telephone Encounter (Signed)
Referral ordered.  I haven't been contacted by [facility].  Thanks.

## 2015-12-01 NOTE — Telephone Encounter (Signed)
Sent. Thanks.   

## 2015-12-04 NOTE — Telephone Encounter (Signed)
Ivin Booty Ward nse case mgr left v/m requesting cb about note left on 11/30/15. Left v/m requesting cb.

## 2015-12-05 NOTE — Telephone Encounter (Signed)
I don't see any notes about multiple myeloma and I don't recall any dx of such.  If they have papers about such a dx, then please let me know.  Thanks.

## 2015-12-05 NOTE — Telephone Encounter (Signed)
Ivin Booty called back because she did not get a cb from 11/30/15 visit; explained pts wife called same day and cb went to pts wife. Ivin Booty wants to know if pt has multiple myeolomas. Pt is having a lot of pain in lt hip area. Advised Ivin Booty pt does have referral to Dr Percell Miller ortho. Ivin Booty request cb.

## 2015-12-05 NOTE — Telephone Encounter (Signed)
Left detailed message on voicemail of [facility].  Number listed earlier in these notes.

## 2015-12-06 ENCOUNTER — Telehealth: Payer: Self-pay

## 2015-12-06 NOTE — Telephone Encounter (Signed)
If the facility, [facility], has called for approval with the insurance company then I'll be glad to fill out the FL2.  Please pull a blank FL2 and I'll start working on it.  Thanks.

## 2015-12-06 NOTE — Telephone Encounter (Signed)
Patient's wife [name]) notified as instructed by telephone and verbalized understanding. [name] stated that she was told by someone at [facility] that you would need to fill out the [facility] form and bring it to them and then they would call the insurance company. Form is on your desk.

## 2015-12-06 NOTE — Telephone Encounter (Signed)
Gregory Wiley (DPR signed) wants to know the status of getting pt into a nursing home.Heather at  [facility] is going to contact [facility] but pt needs FL2 form filled out and Gregory Wiley will try to get FL2 to office. Pt has seen Dr Percell Miller and when pt is admitted to skilled nursing facility pt will get PT and OT daily. FYI to Dr Damita Dunnings.

## 2015-12-11 NOTE — Telephone Encounter (Signed)
FL2 done, please attach a med/allergy list and send with the form. Thanks.

## 2015-12-11 NOTE — Telephone Encounter (Signed)
Medication/allergy list attached and wife notified that form is ready for pick-up.

## 2015-12-12 ENCOUNTER — Other Ambulatory Visit (INDEPENDENT_AMBULATORY_CARE_PROVIDER_SITE_OTHER): Payer: Medicare Other

## 2015-12-12 ENCOUNTER — Telehealth: Payer: Self-pay | Admitting: Family Medicine

## 2015-12-12 DIAGNOSIS — Z111 Encounter for screening for respiratory tuberculosis: Secondary | ICD-10-CM

## 2015-12-12 NOTE — Telephone Encounter (Signed)
Wife called -  Pt needs tb test for going into nursing facility.  Can he have the tb test that is a blood draw? I do not have a nurse visit appointment until after he is supposed to go into the nursing home.

## 2015-12-12 NOTE — Telephone Encounter (Signed)
Ordered. Thanks

## 2015-12-12 NOTE — Telephone Encounter (Signed)
Scheduled for 09/26 Wife aware

## 2015-12-15 NOTE — Telephone Encounter (Signed)
Patient's wife called to get the results of the TB test.  She said patient is suppose to go to [facility] on Monday and they won't let patient be admitted until they have the tb test results.  Patient's wife is asking for results to be faxed to [facility] when the results are received.  [facility] fax number is [phone] ATTN: Gayland Curry.  If there are any questions, patient's wife can be reached at [phone].

## 2015-12-17 NOTE — Telephone Encounter (Signed)
I don't have any results on this yet.  Notify them please.  Terri- what the estimated time of result?  Thanks.

## 2015-12-18 ENCOUNTER — Other Ambulatory Visit: Payer: Self-pay | Admitting: Neurology

## 2015-12-18 ENCOUNTER — Other Ambulatory Visit: Payer: Self-pay | Admitting: Family Medicine

## 2015-12-18 ENCOUNTER — Encounter: Payer: Self-pay | Admitting: *Deleted

## 2015-12-18 DIAGNOSIS — Z79899 Other long term (current) drug therapy: Secondary | ICD-10-CM

## 2015-12-18 LAB — QUANTIFERON TB GOLD ASSAY (BLOOD)
Interferon Gamma Release Assay: POSITIVE — AB
Mitogen-Nil: >10 IU/mL
Quantiferon Nil Value: 0.02 IU/mL
Quantiferon Tb Ag Minus Nil Value: 2.78 IU/mL

## 2015-12-18 MED ORDER — ISONIAZID 300 MG PO TABS
300.0000 mg | ORAL_TABLET | Freq: Every day | ORAL | 1 refills | Status: DC
Start: 2015-12-18 — End: 2016-02-06

## 2015-12-18 MED ORDER — VITAMIN B-6 50 MG PO TABS: 50.0000 mg | ORAL_TABLET | Freq: Every day | ORAL | 1 refills | Status: DC

## 2015-12-18 NOTE — Telephone Encounter (Signed)
Vaughan Basta- let me know when you talk to her, if concerns persist.  Thanks.

## 2015-12-18 NOTE — Telephone Encounter (Signed)
Pt wife called. Medication Nydrazid that is recommended is going to be $500 for a 30 day supply through the pharmacy at [facility].  Pt wife wants to know if there is something else that can be called in that is cheaper? I also asked pt wife to call their insurance company to see if there was an alternative that they would cover more.  Please advise   [phone] [name] [place])

## 2015-12-18 NOTE — Progress Notes (Signed)
Call pt.  TB test positive.  Prev with CXR done.  Doesn't need imaging at this point.  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  I don't suspect active TB.  Would need 6 months of treatment with isoniazid with concurrent B6.  Orders entered.  Let me know if rx needs to be sent in for either med.    Will need monthly labs re: liver tests in the meantime as routine monitoring.  Orders in.  Can be done here or elsewhere as needed/convenient.  1st panel due in 1 month.    Thanks.    For charting purposes- didn't use rifampin given other chronic meds/interactions.

## 2015-12-18 NOTE — Telephone Encounter (Signed)
This Probation officer assumed communication role with family and facility regarding patient's positive TB reading.  Currently, facility is reviewing a plan to be sure they can handle the patient at their facility.  They understand that patient is not experiencing active symptoms and is considered in a latent phase not requiring negative pressure room arrangements.  R/X of isoniazid and vitamin B6 are to be administered daily X 6 months with monthly Hepatic panel testing to ensure liver safety.    Facility rep, Nira Conn, was faxed up to date orders with above instructions to run through their Pharmacy to ensure ability to pay/cover the medication prescribed.  Patient was scheduled to admit at 1pm this afternoon; however, the plan is now on hold until facility [facility]) can verify ability to provide the care the patient needs.    Currently, we are awaiting facility response to admit patient.  I spoke with wife directly and she is aware and anxious to hear the decision.

## 2015-12-18 NOTE — Telephone Encounter (Signed)
Spouse called at 8:45 am to get TB results, pt is scheduled to be admitted to nursing home today at 1 pm.  Best number to call is [phone]

## 2015-12-18 NOTE — Telephone Encounter (Signed)
Wife advised of positive result.  Please advise of next step.  Patient is to enter nursing home today at 1 pm and wife is afraid that he won't be able to get in for some time if not today.

## 2015-12-19 ENCOUNTER — Telehealth: Payer: Self-pay

## 2015-12-19 ENCOUNTER — Other Ambulatory Visit: Payer: Self-pay | Admitting: Neurology

## 2015-12-19 NOTE — Telephone Encounter (Signed)
This (isoniazid) is the best option, given his other meds.    I called [facility] but they aren't open yet.   Please call them after 9am to get some input on this, ie typical pricing.  The AWP on this shouldn't be nearly $500, based on what I reviewed here.   Thanks.

## 2015-12-19 NOTE — Telephone Encounter (Signed)
I thank all involved. 

## 2015-12-19 NOTE — Telephone Encounter (Signed)
This Probation officer spoke with facility pharmacy ( [facility]) who states that the medication is around $17.00 per month for a 1 month supply.  I called Gregory Wiley (admissions intake) with [facility] to discuss findings and ask why they were quoting such a steep price.    Gregory Wiley states that the Gregory Wiley misunderstood her, they actually quoted her a cost of $500.00 per month total on all of her husbands medications not just the isoniazid (cost was actually 50 cents per day on that one).  She states that Gregory Wiley understands now and they are moving forward with admission plans.    I spoke directly with Gregory Wiley to ensure that she is okay with plan and has no further questions on our end.  She verbalizes understanding with plan moving forward.

## 2015-12-19 NOTE — Telephone Encounter (Signed)
PLEASE NOTE: All timestamps contained within this report are represented as [place] Standard Time. CONFIDENTIALTY NOTICE: This fax transmission is intended only for the addressee. It contains information that is legally privileged, confidential or otherwise protected from use or disclosure. If you are not the intended recipient, you are strictly prohibited from reviewing, disclosing, copying using or disseminating any of this information or taking any action in reliance on or regarding this information. If you have received this fax in error, please notify us immediately by telephone so that we can arrange for its return to [place]. Phone: [phone], Toll-Free: [phone], Fax: [phone] Page: 1 of 1 Call Id: [id number] [place] Night - Client Nonclinical Telephone Record [place] Night - Client Client Site [place] Physician Renford Dills - MD Contact Type Call Who Is Calling Patient / Member / Family / Caregiver Caller Name Gregory Wiley Caller Phone Number [phone] Call Type Message Only Information Provided Reason for Call Returning a Call from the Office Initial [place] they have found a nursing home for her husband, so no need to work on this any more Additional Comment Husband name is [name] DOB 09-16-1950 Call Closed By: Almon Register Transaction Date/Time: 12/18/2015 5:11:10 PM (ET)

## 2015-12-31 IMAGING — CT CT ABD-PELV W/O CM
2 of 4 series · 15 of 46 positions shown, 17 images · non-contrast
Comparison: None.

CLINICAL DATA: Adrenal insufficiency

EXAM:
CT ABDOMEN AND PELVIS WITHOUT CONTRAST
TECHNIQUE: Multidetector CT imaging of the abdomen and pelvis was performed
following the standard protocol without oral or intravenous contrast
material administration.

[Series 2: a/p w/o 5mm · axial · non-contrast · 0.98mm/px · z∈[+794,+1324]mm · 12 of 116 slices shown, 14 images]
[im 5/116  soft-tissue]
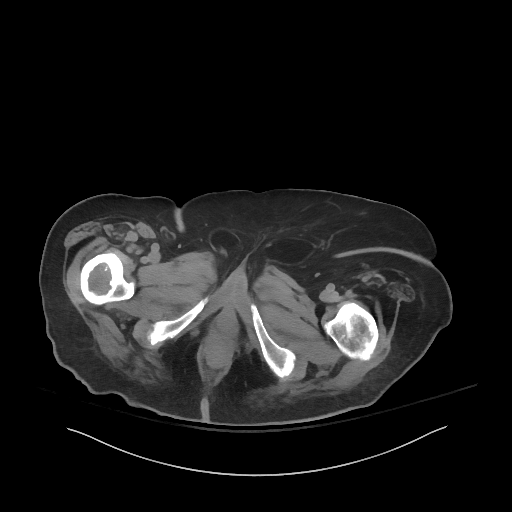
[im 5/116  bone]
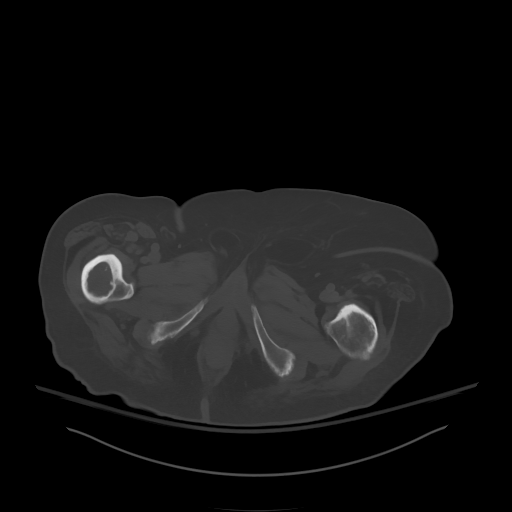
[im 15/116  soft-tissue]
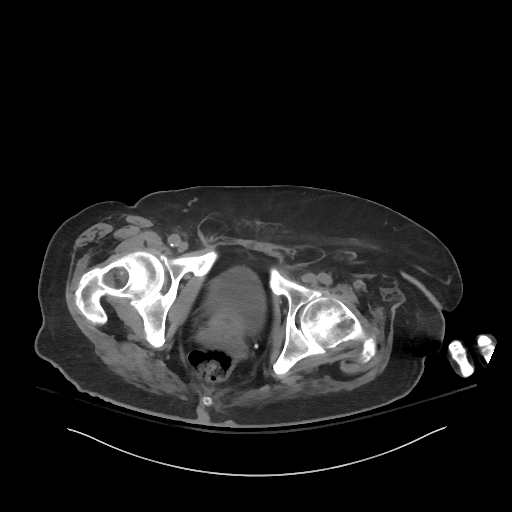
[im 24/116  soft-tissue]
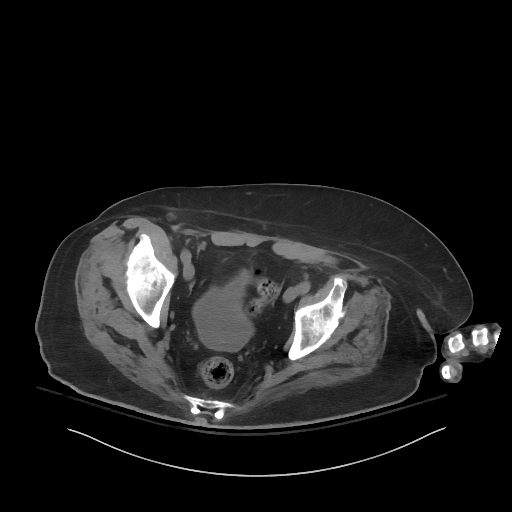
[im 34/116  soft-tissue]
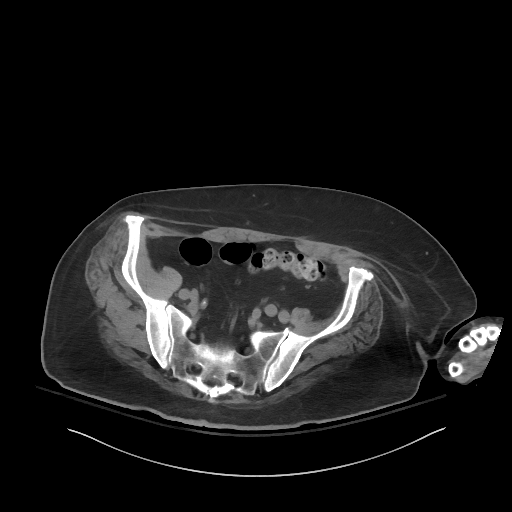
[im 44/116  soft-tissue]
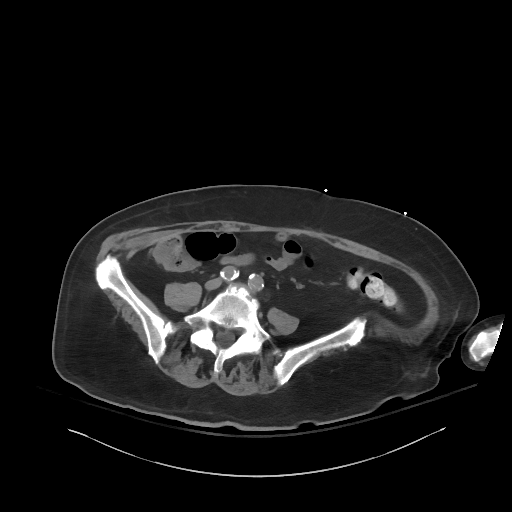
[im 53/116  soft-tissue]
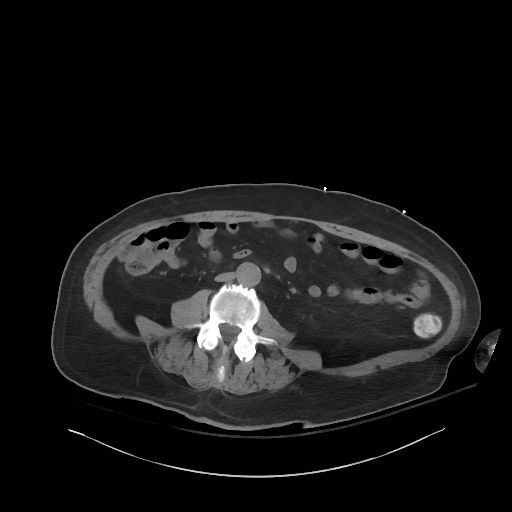
[im 63/116  soft-tissue]
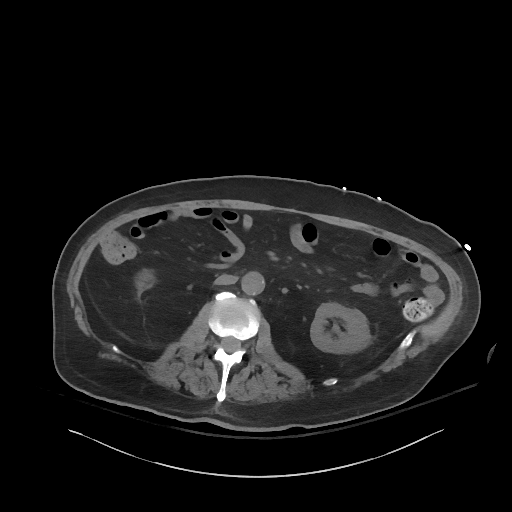
[im 72/116  soft-tissue]
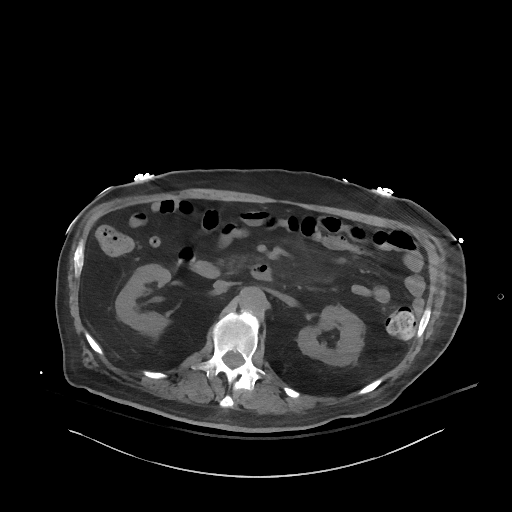
[im 82/116  soft-tissue]
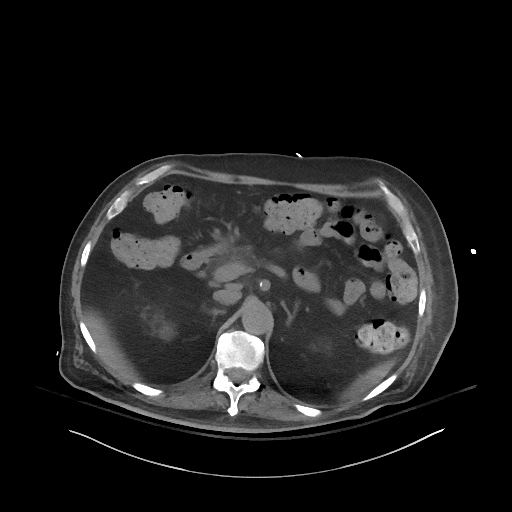
[im 82/116  bone]
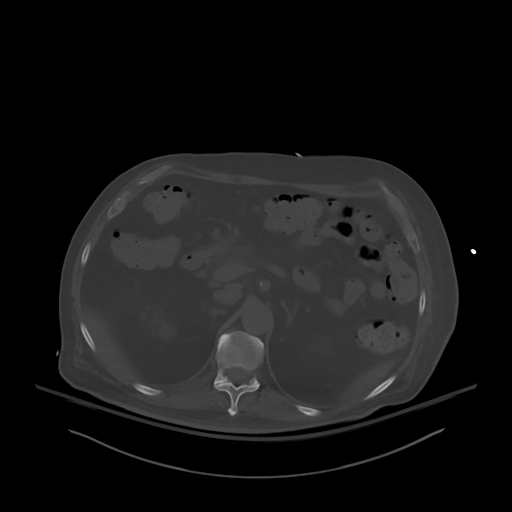
[im 92/116  soft-tissue]
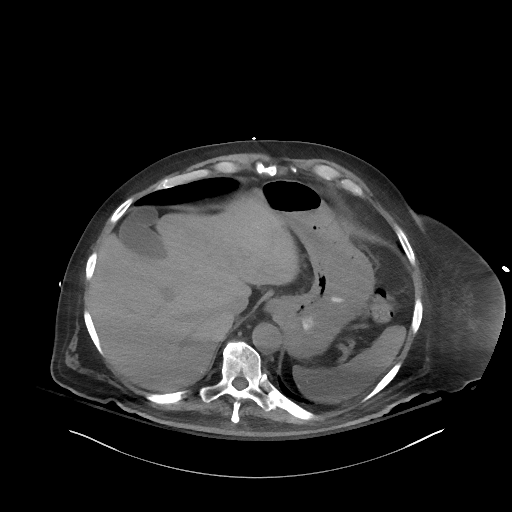
[im 101/116  soft-tissue]
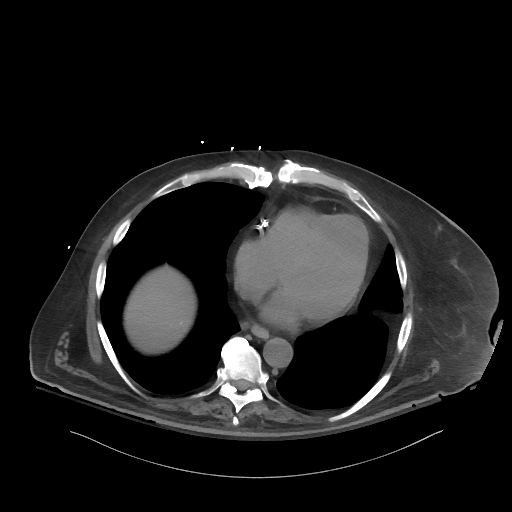
[im 111/116  soft-tissue]
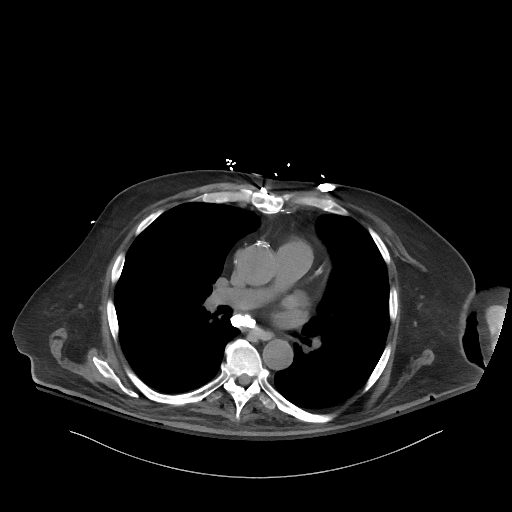

[Series 5: a/p w/o cor · coronal · non-contrast · 0.95mm/px · 3 of 128 slices shown]
[im 43/128  soft-tissue]
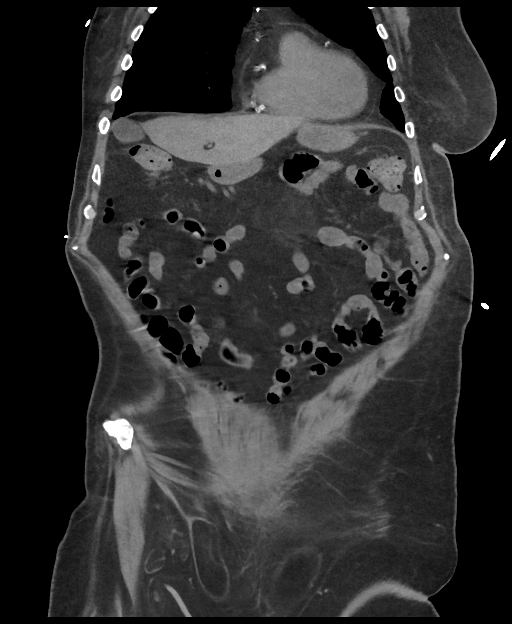
[im 57/128  soft-tissue]
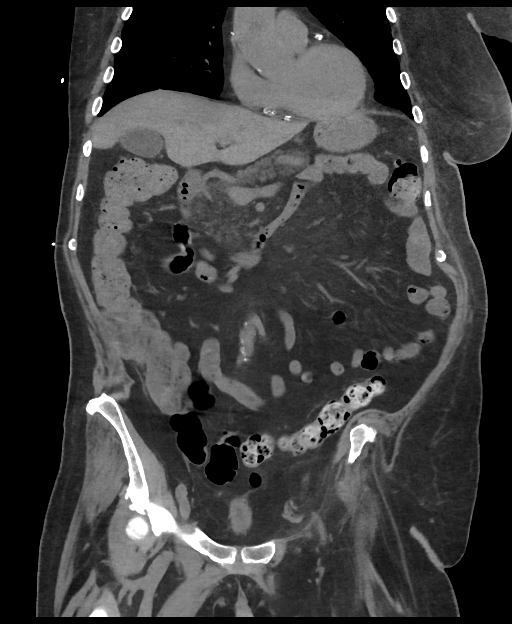
[im 71/128  soft-tissue]
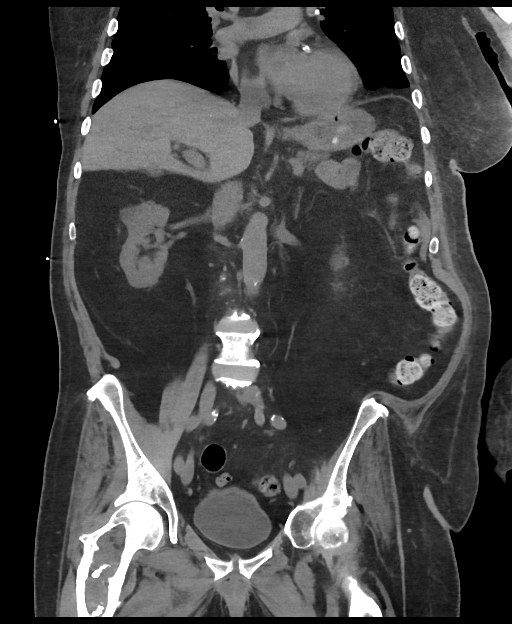

[15 of 46 positions shown; findings below may reference images not displayed]

FINDINGS: Lower chest: There is slight bibasilar atelectasis. Patient is
status post coronary artery bypass grafting. There is extensive
native coronary artery calcification. There are calcified sub-
carinal lymph nodes.

Hepatobiliary: There are occasional small calcified granulomas in
the liver. No other focal liver lesions are identified on this
noncontrast enhanced study. Gallbladder wall is not thickened. There
is no biliary duct dilatation.

Pancreas: No mass or inflammatory focus. There is fatty infiltration
in portions of the pancreas.

Spleen: There are scattered calcified granulomas in the spleen.
Spleen otherwise appears normal.

Adrenals/Urinary Tract: Adrenals are normal in size and contour
bilaterally. No adrenal lesions are identified.

There is a cyst in the midportion of the right kidney measuring
x 3.2 cm. There is no hydronephrosis on either side. No renal or
ureteral calculi are identified on either side. Urinary bladder is
midline with normal wall thickness.

Stomach/Bowel: There is no bowel wall thickening. No free air or
portal venous air. No obstruction.

Vascular/Lymphatic: No adenopathy is seen in the abdomen or pelvis.
There is atherosclerotic change in the aorta and iliac arteries
without aneurysm.

Reproductive: Prostate is normal in size and contour. There are a
few tiny prostatic calculi. No pelvic mass or pelvic fluid
collection.

Other: In the mid abdomen, there is localized thickening of the
mesentery, not causing distortion of adjacent structures. There is
no appreciable adenopathy in this area. There is a minimal ventral
hernia containing only fat. There is fat in each inguinal ring.
There is no periappendiceal region inflammation.

Musculoskeletal: There is extensive muscle in the upper left
anterior and lateral thigh. There is muscle atrophy involving the
left lateral rectus muscles without frank herniation. There is also
gluteal atrophy on the left. No intramuscular mass lesions or
hematoma is seen. No abdominal wall lesions. There is a small
phlebolith in the posterior pelvic wall. There is a large cystic
appearing lesion in the proximal right femur extending from the
right femoral neck into the proximal femoral diaphysis measuring
x 2.7 cm. This lesion contains chondroid appearing material. It is
incompletely visualized on this study. There is narrowing of each
hip joint. There is degenerative change in the lumbar spine.
IMPRESSION: Adrenals appear normal.

There is a 9.2 x 2.7 cm cystic appearing lesion in the proximal
right femur which contains areas of apparent chondroid matrix.
Etiology for this lesion uncertain. This finding warrants orthopedic
consultation with consideration for potential MR of the right femur
to further evaluate.

There is localized thickening of the mesenteric in the mid abdomen
without surrounding distortion. Suspect a degree of sclerosing
mesenteritis.

No bowel obstruction.  No abscess.

No renal or ureteral calculi.  No hydronephrosis.

Multiple foci of arterial vascular calcification.

Extensive muscle atrophy in the left pelvis/upper thigh region.
There is also left lateral rectus muscle atrophy and left gluteal
muscle atrophy. These findings are likely secondary to previous
right-sided middle cerebral artery distribution infarct noted on
prior MR.

## 2016-01-11 ENCOUNTER — Telehealth: Payer: Self-pay | Admitting: Family Medicine

## 2016-01-11 NOTE — Telephone Encounter (Signed)
Remo Lipps with [facility] advised.

## 2016-01-11 NOTE — Telephone Encounter (Signed)
Gregory Wiley from [facility] home health  Pt is coming out of nursing home rehab - [facility]. WIll you sign for  PT OT  [facility] ?  cb number is [phone] Thank you

## 2016-01-11 NOTE — Telephone Encounter (Signed)
Yes, please give the order.  Send any needed forms my way.  Thanks.

## 2016-01-24 ENCOUNTER — Telehealth: Payer: Self-pay | Admitting: Physician Assistant

## 2016-01-24 NOTE — Telephone Encounter (Signed)
New Message:    Pt wants and needs his loop recorder removed please.

## 2016-01-24 NOTE — Telephone Encounter (Signed)
I received this message today that pt is requesting his loop recorder to be taken out. Not sure why I received this message. Looks like this pt sees Dr. Claiborne Billings. I will route this to Dr. Evette Georges CMA Mariann Laster.

## 2016-02-02 ENCOUNTER — Telehealth: Payer: Self-pay | Admitting: Cardiovascular Disease

## 2016-02-02 NOTE — Telephone Encounter (Signed)
Duplicate  See below

## 2016-02-02 NOTE — Telephone Encounter (Signed)
New message  Pt is calling back to f/u on message sent on 11/8  Pt needs loop recorder removed/needs Estem therapy  Please follow up and advise  Loop recorder implanted on 02/15/2014  States monitor does not receive signal Please call back

## 2016-02-03 NOTE — Telephone Encounter (Signed)
Claiborne Billings, please follow-up with the patient.

## 2016-02-05 NOTE — Telephone Encounter (Signed)
Dr Rayann Heman would like to know what it is that is being done as he thinks it may be okay to have with the LINQ in.  He feels may be okay to have E-stim therapy(TENS therapy) with [facility]

## 2016-02-05 NOTE — Telephone Encounter (Signed)
F/u message ° °Pt daughter returning RN call. Please call back to discuss  °

## 2016-02-05 NOTE — Telephone Encounter (Signed)
Left message for patient to return call.  Discussed with MDT and it is okay to hae this therapy with LINQ in place.  All we would ask is to write down times of therapy so we will know in case of noise on monitor.

## 2016-02-06 ENCOUNTER — Other Ambulatory Visit: Payer: Self-pay | Admitting: *Deleted

## 2016-02-06 NOTE — Addendum Note (Signed)
Addended by: Helene Shoe on: 02/06/2016 03:02 PM   Modules accepted: Orders

## 2016-02-06 NOTE — Progress Notes (Addendum)
Gregory Wiley left v/m requesting refill isoniazid and vit B 6 50 mg. [facility]. Gregory Wiley request cb to [phone].Please advise. Pt has been released from nursing home.

## 2016-02-07 MED ORDER — VITAMIN B-6 50 MG PO TABS: 50.0000 mg | ORAL_TABLET | Freq: Every day | ORAL | 0 refills | Status: DC

## 2016-02-07 MED ORDER — ISONIAZID 300 MG PO TABS: 300.0000 mg | ORAL_TABLET | Freq: Every day | ORAL | 0 refills | Status: DC

## 2016-02-07 MED ORDER — MELATONIN 10 MG PO CAPS: 10.0000 mg | ORAL_CAPSULE | Freq: Every evening | ORAL | 3 refills | Status: DC | PRN

## 2016-02-07 NOTE — Telephone Encounter (Signed)
He will only need 6 months total of isoniazid and B6.  Should have already taken 2 months.  Sig adjusted.   rxs sent.  Thanks.

## 2016-02-12 NOTE — Telephone Encounter (Signed)
Left message for daughter to call me back to discuss plan.

## 2016-02-12 NOTE — Telephone Encounter (Signed)
Follow up   Pt daughter returning call

## 2016-02-12 NOTE — Telephone Encounter (Signed)
LINQ removal 02/14/16  Be at [facility] [facility]tr[facility] at [facility] Will need a follow up post

## 2016-02-13 ENCOUNTER — Other Ambulatory Visit: Payer: Medicare Other

## 2016-02-14 ENCOUNTER — Ambulatory Visit (HOSPITAL_COMMUNITY)
Admission: RE | Admit: 2016-02-14 | Discharge: 2016-02-14 | Disposition: A | Discharge status: Home / Self Care | Payer: Medicare Other | Source: Ambulatory Visit | Attending: Internal Medicine | Admitting: Internal Medicine

## 2016-02-14 ENCOUNTER — Encounter (HOSPITAL_COMMUNITY): Payer: Self-pay | Admitting: Internal Medicine

## 2016-02-14 ENCOUNTER — Encounter (HOSPITAL_COMMUNITY)
Admission: RE | Disposition: A | Discharge status: Home / Self Care | Payer: Self-pay | Source: Ambulatory Visit | Attending: Internal Medicine

## 2016-02-14 DIAGNOSIS — G4733 Obstructive sleep apnea (adult) (pediatric): Secondary | ICD-10-CM | POA: Insufficient documentation

## 2016-02-14 DIAGNOSIS — I10 Essential (primary) hypertension: Secondary | ICD-10-CM | POA: Diagnosis not present

## 2016-02-14 DIAGNOSIS — Z79899 Other long term (current) drug therapy: Secondary | ICD-10-CM | POA: Insufficient documentation

## 2016-02-14 DIAGNOSIS — I69354 Hemiplegia and hemiparesis following cerebral infarction affecting left non-dominant side: Secondary | ICD-10-CM | POA: Diagnosis not present

## 2016-02-14 DIAGNOSIS — I63411 Cerebral infarction due to embolism of right middle cerebral artery: Secondary | ICD-10-CM

## 2016-02-14 DIAGNOSIS — Z4509 Encounter for adjustment and management of other cardiac device: Secondary | ICD-10-CM | POA: Diagnosis present

## 2016-02-14 DIAGNOSIS — I2510 Atherosclerotic heart disease of native coronary artery without angina pectoris: Secondary | ICD-10-CM | POA: Insufficient documentation

## 2016-02-14 DIAGNOSIS — Z8249 Family history of ischemic heart disease and other diseases of the circulatory system: Secondary | ICD-10-CM | POA: Insufficient documentation

## 2016-02-14 DIAGNOSIS — Z87891 Personal history of nicotine dependence: Secondary | ICD-10-CM | POA: Insufficient documentation

## 2016-02-14 DIAGNOSIS — Z955 Presence of coronary angioplasty implant and graft: Secondary | ICD-10-CM | POA: Insufficient documentation

## 2016-02-14 DIAGNOSIS — Z86711 Personal history of pulmonary embolism: Secondary | ICD-10-CM | POA: Diagnosis not present

## 2016-02-14 DIAGNOSIS — I252 Old myocardial infarction: Secondary | ICD-10-CM | POA: Diagnosis not present

## 2016-02-14 DIAGNOSIS — Z951 Presence of aortocoronary bypass graft: Secondary | ICD-10-CM | POA: Diagnosis not present

## 2016-02-14 HISTORY — PX: EP IMPLANTABLE DEVICE: SHX172B

## 2016-02-14 SURGERY — LOOP RECORDER REMOVAL

## 2016-02-14 MED ORDER — LIDOCAINE-EPINEPHRINE 1 %-1:100000 IJ SOLN
INTRAMUSCULAR | Status: DC | PRN
  Administered 2016-02-14: 5 mL

## 2016-02-14 MED ORDER — LIDOCAINE-EPINEPHRINE 1 %-1:100000 IJ SOLN
INTRAMUSCULAR | Status: AC
  Filled 2016-02-14: qty 2

## 2016-02-14 SURGICAL SUPPLY — 1 items: PACK LOOP INSERTION (CUSTOM PROCEDURE TRAY) ×2 IMPLANT

## 2016-02-14 NOTE — H&P (Signed)
ELECTROPHYSIOLOGY CONSULT NOTE    Primary Care Physician: [name], MD  Admit Date: 02/14/2016  Gregory Wiley is a 65 y.o. 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  He has made fair but not complete recovery from his stroke.  He continues to have residual L weakness.  He has been monitored closely with ILR without afib detected.  He now presents with ILR removal.  Today, he denies symptoms of palpitations, chest pain, shortness of breath, orthopnea, PND, lower extremity edema, dizziness, presyncope, syncope, or neurologic sequela. The patient is tolerating medications without difficulties and is otherwise without complaint today.   Past Medical History:  Diagnosis Date  . Adrenal insufficiency ([place])   . Anxiety   . Basal cell carcinoma of skin of left ankle   . Bleeding stomach ulcer 2015  . Coronary artery disease    stent, then CABG 07/20/10  . Daily headache    "for the past month" (11/11/2014)  . Depression   . Diverticulosis   . GERD (gastroesophageal reflux disease)   . Gout   . H/O cardiomyopathy    ischemic, now last echo 07/30/11, EF 38%W  . History of hiatal hernia   . Hyperlipidemia   . Hypertension   . Internal hemorrhoids   . Left hemiplegia ([place])   . NSTEMI (non-ST elevated myocardial infarction) ([place])    NSTEMI- last cath 06/2011-Stent to LCX-DES, last nuc 07/30/11 low risk  . Obesity (BMI 30-39.9)   . OSA on CPAP    since July 2013- uses CPAP sometimes , states he has lost 147 lbs. since stroke & doesn't use the CPAP as much as he use to.   . Plantar fasciitis of left foot   . Pneumonia    hosp. 12/2014  . Pulmonary embolism ([place]) 03/2014  . Stroke ([place]) 01/2014   "no control of LUE, can slightly LLE; memory problems since" (11/11/2014)  . Tubular adenoma of colon    Past Surgical History:  Procedure Laterality Date  . BASAL CELL CARCINOMA EXCISION Left  2015   ankle  . CARDIAC SURGERY    . COLONOSCOPY N/A 02/10/2013   Procedure: COLONOSCOPY;  Surgeon: Ladene Artist, MD;  Location: [facility];  Service: Endoscopy;  Laterality: N/A;  . CORONARY ANGIOPLASTY WITH STENT PLACEMENT  07/01/2011   DES-Resolute to native LCX  . CORONARY ANGIOPLASTY WITH STENT PLACEMENT  12/01/2007   COMPLEX 5 LESION PCI INCLUDING CUTTING BALLOON AND 4 CYPHER DESs  . CORONARY ARTERY BYPASS GRAFT  07/20/2010    LIMA-LAD; VG-ACUTE MARG of RCA; Seq VG-distal RCA & then pda  . ESOPHAGOGASTRODUODENOSCOPY  01/2014   gastric ulcer, erosive gastroduodenitis, H Pylori negative.   . ESOPHAGOGASTRODUODENOSCOPY (EGD) WITH PROPOFOL N/A 11/14/2014   Procedure: ESOPHAGOGASTRODUODENOSCOPY (EGD) WITH PROPOFOL;  Surgeon: Milus Banister, MD;  Location: [place];  Service: Endoscopy;  Laterality: N/A;  . HAND SURGERY Right    to take glass out  . HERNIA REPAIR    . INTRAMEDULLARY (IM) NAIL INTERTROCHANTERIC Right 02/07/2015   Procedure: INTRAMEDULLARY (IM) NAIL INTERTROCHANTRIC RIGHT HIP;  Surgeon: Renette Butters, MD;  Location: [place];  Service: Orthopedics;  Laterality: Right;  . KNEE ARTHROSCOPY Right   . LEFT HEART CATHETERIZATION WITH CORONARY/GRAFT ANGIOGRAM  07/01/2011   Procedure: LEFT HEART CATHETERIZATION WITH Beatrix Fetters;  Surgeon: Sanda Klein, MD;  Location: [facility] CATH LAB;  Service: Cardiovascular;;  .  LOOP RECORDER IMPLANT  02/15/2014   MDT LINQ implanted by Dr Rayann Heman for cryptogenic stroke  . PERCUTANEOUS CORONARY STENT INTERVENTION (PCI-S) Right 07/01/2011   Procedure: PERCUTANEOUS CORONARY STENT INTERVENTION (PCI-S);  Surgeon: Sanda Klein, MD;  Location[facility] CATH LAB;  Service: Cardiovascular;  Laterality: Right;  . PICC LINE PLACE PERIPHERAL ([place] HX)  06/2015  . RETINAL DETACHMENT SURGERY Right   . TEE WITHOUT CARDIOVERSION N/A 02/15/2014   Procedure: TRANSESOPHAGEAL ECHOCARDIOGRAM (TEE);  Surgeon: Josue Hector, MD;  Location: [place];   Service: Cardiovascular;  Laterality: N/A;  . UMBILICAL HERNIA REPAIR  2006       Allergies  Allergen Reactions  . Diphenhydramine Hcl Anaphylaxis  . Adhesive [Tape] Other (See Comments)    Tears skin - please use paper tape    Social History   Social History  . Marital status: Married    Spouse name: N/A  . Number of children: 5  . Years of education: N/A   Occupational History  .  [employer]    works at the [employer]  . Smoking status: Former Smoker    Packs/day: 2.00    Years: 4.00    Types: Cigarettes    Quit date: 01/30/1969  . Smokeless tobacco: Never Used     Comment: "quit smoking cigarettes  in the 1970's"  . Alcohol use No  . Drug use:     Types: Marijuana     Comment: "smoked some pot in college"  . Sexual activity: Not Currently   Other Topics Concern  . 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      Family History  Problem Relation Age of Onset  . Diabetes type II Mother   . CAD Father 30    CABG, PCI, died at 64  . Heart attack Father 32  . Hypertension Brother   . Diabetes Brother   . Hyperlipidemia Brother   . Brain cancer Maternal Grandmother   . COPD Paternal Grandfather   . Coronary artery disease    . Other      denies family history of DVT/PE  . Colon cancer Neg Hx   . Stroke Neg Hx     ROS- All systems are reviewed and negative except as per the HPI above  Physical Exam: Telemetry: Vitals:   02/14/16 0642  BP: 126/80  Pulse: 79  Resp: 18  Temp: 97.7 F (36.5 C)  TempSrc: Oral  SpO2: 100%  Weight: 200 lb (90.7 kg)  Height: 6' (1.829 m)    GEN- The patient is chronically ill appearing, alert and oriented x 3 today.   Head- normocephalic, atraumatic Eyes-  Sclera clear, conjunctiva pink Ears- hearing  intact Oropharynx- clear Neck- supple  Lungs- Clear to ausculation bilaterally, normal work of breathing Heart- Regular rate and rhythm  GI- soft, NT, ND, + BS Extremities- no clubbing, cyanosis, or edema MS- no significant deformity or atrophy Skin- no rash or lesion Psych- euthymic mood, full affect Neuro- strength and sensation are intact  ILR is reviewed today and reveals no afib  Labs:   Lab Results  Component Value Date   WBC 5.2 11/09/2015   HGB 13.9 11/09/2015   HCT 41.1 11/09/2015   MCV 84.5 11/09/2015   PLT  167.0 11/09/2015   No results for input(s): NA, K, CL, CO2, BUN, CREATININE, CALCIUM, PROT, BILITOT, ALKPHOS, ALT, AST, GLUCOSE in the last 168 hours.  Invalid input(s): LABALBU Lab Results  Component Value Date   CKTOTAL 287 (H) 06/30/2011   CKMB 27.8 (HH) 06/30/2011   TROPONINI 0.03 07/15/2015    Lab Results  Component Value Date   CHOL 130 08/11/2015   CHOL 113 07/22/2014   CHOL 181 02/13/2014   Lab Results  Component Value Date   HDL 49 08/11/2015   HDL 38 (L) 07/22/2014   HDL 40 02/13/2014   Lab Results  Component Value Date   LDLCALC 66 08/11/2015   LDLCALC 54 07/22/2014   LDLCALC 120 (H) 02/13/2014   Lab Results  Component Value Date   TRIG 75 08/11/2015   TRIG 104 07/22/2014   TRIG 105 02/13/2014   Lab Results  Component Value Date   CHOLHDL 2.7 08/11/2015   CHOLHDL 3.0 07/22/2014   CHOLHDL 4.5 02/13/2014   ASSESSMENT AND PLAN:   S/p prior ILR placement for cryptogenic stroke  No AF detected in 2 years of ILR monitoring.  I had a long discussion with the patient and his family about leaving the monitor vs removal.  I have informed that that there would still be benefit from ongoing ILR monitoring.  We discussed risks of ILR removal today.  They understand risk and wish to proceed with ILR removal at this time.  Thompson Grayer MD, [facility] 02/14/2016 8:13 AM       Thompson Grayer, MD 02/14/2016  8:06 AM

## 2016-02-20 ENCOUNTER — Encounter: Payer: Self-pay | Admitting: Family Medicine

## 2016-02-20 ENCOUNTER — Ambulatory Visit (INDEPENDENT_AMBULATORY_CARE_PROVIDER_SITE_OTHER): Payer: Medicare Other | Admitting: Family Medicine

## 2016-02-20 VITALS — BP 122/76 | HR 100 | Temp 97.6°F | Wt 205.0 lb

## 2016-02-20 DIAGNOSIS — Z201 Contact with and (suspected) exposure to tuberculosis: Secondary | ICD-10-CM

## 2016-02-20 DIAGNOSIS — N63 Unspecified lump in unspecified breast: Secondary | ICD-10-CM

## 2016-02-20 DIAGNOSIS — Z79899 Other long term (current) drug therapy: Secondary | ICD-10-CM

## 2016-02-20 DIAGNOSIS — D509 Iron deficiency anemia, unspecified: Secondary | ICD-10-CM | POA: Diagnosis not present

## 2016-02-20 DIAGNOSIS — E559 Vitamin D deficiency, unspecified: Secondary | ICD-10-CM

## 2016-02-20 DIAGNOSIS — D649 Anemia, unspecified: Secondary | ICD-10-CM

## 2016-02-20 DIAGNOSIS — N6320 Unspecified lump in the left breast, unspecified quadrant: Secondary | ICD-10-CM

## 2016-02-20 LAB — CBC WITH DIFFERENTIAL/PLATELET
Basophils Absolute: 0 10*3/uL (ref 0.0–0.1)
Basophils Relative: 0.6 % (ref 0.0–3.0)
Eosinophils Absolute: 0.3 10*3/uL (ref 0.0–0.7)
Eosinophils Relative: 6.8 % — ABNORMAL HIGH (ref 0.0–5.0)
HCT: 39.3 % (ref 39.0–52.0)
Hemoglobin: 13 g/dL (ref 13.0–17.0)
Lymphocytes Relative: 26.6 % (ref 12.0–46.0)
Lymphs Abs: 1.3 10*3/uL (ref 0.7–4.0)
MCHC: 33.2 g/dL (ref 30.0–36.0)
MCV: 85.1 fl (ref 78.0–100.0)
Monocytes Absolute: 0.3 10*3/uL (ref 0.1–1.0)
Monocytes Relative: 5 % (ref 3.0–12.0)
Neutro Abs: 3.1 10*3/uL (ref 1.4–7.7)
Neutrophils Relative %: 61 % (ref 43.0–77.0)
Platelets: 130 10*3/uL — ABNORMAL LOW (ref 150.0–400.0)
RBC: 4.61 Mil/uL (ref 4.22–5.81)
RDW: 13.6 % (ref 11.5–15.5)
WBC: 5.1 10*3/uL (ref 4.0–10.5)

## 2016-02-20 LAB — IBC PANEL
Iron: 46 ug/dL (ref 42–165)
Saturation Ratios: 14 % — ABNORMAL LOW (ref 20.0–50.0)
Transferrin: 235 mg/dL (ref 212.0–360.0)

## 2016-02-20 LAB — VITAMIN D 25 HYDROXY (VIT D DEFICIENCY, FRACTURES): VITD: 28.13 ng/mL — ABNORMAL LOW (ref 30.00–100.00)

## 2016-02-20 LAB — HEPATIC FUNCTION PANEL
ALT: 11 U/L (ref 0–53)
AST: 17 U/L (ref 0–37)
Albumin: 3.8 g/dL (ref 3.5–5.2)
Alkaline Phosphatase: 49 U/L (ref 39–117)
Bilirubin, Direct: 0.1 mg/dL (ref 0.0–0.3)
Total Bilirubin: 0.5 mg/dL (ref 0.2–1.2)
Total Protein: 7.2 g/dL (ref 6.0–8.3)

## 2016-02-20 NOTE — Progress Notes (Signed)
Pre visit review using our clinic review tool, if applicable. No additional management support is needed unless otherwise documented below in the visit note. 

## 2016-02-20 NOTE — Patient Instructions (Signed)
We'll contact you with your lab report. Gregory Wiley will call about your referral. Take care.  Glad to see you.  Update me as needed.   Plan on taking INH and B6 for a total of 6 months.

## 2016-02-20 NOTE — Progress Notes (Signed)
ILR out and healing in the meantime.  Per cards.  D/w pt.  H/o anemia.  Off iron, due for repeat labs.    H/o low vit D.  Reasonable to recheck.    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 Still on INH and B6.  D/w pt.  Some occ cough, but this is not consistent.  No blood in sputum.  No fevers.  No tingling on INH.  Discussed with patient about rationale for treatment.  Spot on L chest wall.  "I had a zit about a year ago."  It prev drained, a few times, in the last year.  Now with a lump in the area.  No recent drainage.  All of the sx are in the same place of the last year or so.  L lateral breast area, level with the nipple.  No R sided lesions.  No FCNAVD.  No rash.    Status post inpatient rehabilitation with physical therapy. He was able to increase his distance walking with rehabilitation. Still using a cane. Still with left-sided weakness.  Meds, vitals, and allergies reviewed.   ROS: Per HPI unless specifically indicated in ROS section   nad ncat Mmm Neck supple rrr ctab Chest wall healing from ILR removal.   Breast exam. Lesion about 3 cm from L nipple at 3 o'clock.  Lesion is about 1 cm across.  It feels like a tough but not tender round lesion in the skin. No erythema. No drainage. No ulceration. No other masses noted on either side. No nipple lesions. Extremities without edema Walking with cane. Left arm still flaccid

## 2016-02-21 DIAGNOSIS — N63 Unspecified lump in unspecified breast: Secondary | ICD-10-CM | POA: Insufficient documentation

## 2016-02-21 DIAGNOSIS — E559 Vitamin D deficiency, unspecified: Secondary | ICD-10-CM | POA: Insufficient documentation

## 2016-02-21 DIAGNOSIS — Z201 Contact with and (suspected) exposure to tuberculosis: Secondary | ICD-10-CM | POA: Insufficient documentation

## 2016-02-21 NOTE — Assessment & Plan Note (Signed)
See notes on labs. 

## 2016-02-21 NOTE — Assessment & Plan Note (Addendum)
Discussed with patient that this was likely benign, likely old scar tissue related to previous lesion at the site. For completeness, reasonable to workup and get mammogram. Ultrasound if needed. Orders placed. He agrees. >25 minutes spent in face to face time with patient, >50% spent in counselling or coordination of care.

## 2016-02-21 NOTE — Assessment & Plan Note (Signed)
With previous testing positive, but no sign of active TB. Continue 6 months total of isoniazid and vitamin B6. Recheck LFTs today. No hemoptysis. He does not feel unwell. Rationale for treatment discussed with patient.

## 2016-02-22 ENCOUNTER — Ambulatory Visit (INDEPENDENT_AMBULATORY_CARE_PROVIDER_SITE_OTHER): Payer: Medicare Other | Admitting: *Deleted

## 2016-02-22 DIAGNOSIS — Z9889 Other specified postprocedural states: Secondary | ICD-10-CM

## 2016-02-22 NOTE — Progress Notes (Signed)
Wound check post linq explant. Steri strips removed. Incision edges approximated and free of redness swelling or drainage. S/s of infection reviewed with education on when to call the office. Tech services number given for a return kit request.

## 2016-02-23 ENCOUNTER — Encounter: Payer: Self-pay | Admitting: Family Medicine

## 2016-02-25 ENCOUNTER — Other Ambulatory Visit: Payer: Self-pay | Admitting: Family Medicine

## 2016-02-25 DIAGNOSIS — Z862 Personal history of diseases of the blood and blood-forming organs and certain disorders involving the immune mechanism: Secondary | ICD-10-CM

## 2016-02-25 DIAGNOSIS — E559 Vitamin D deficiency, unspecified: Secondary | ICD-10-CM

## 2016-02-26 ENCOUNTER — Other Ambulatory Visit: Payer: Self-pay | Admitting: Cardiovascular Disease

## 2016-02-26 ENCOUNTER — Ambulatory Visit
Admission: RE | Admit: 2016-02-26 | Discharge: 2016-02-26 | Disposition: A | Discharge status: Home / Self Care | Payer: Medicare Other | Source: Ambulatory Visit | Attending: Family Medicine | Admitting: Family Medicine

## 2016-02-26 ENCOUNTER — Other Ambulatory Visit: Payer: Self-pay | Admitting: *Deleted

## 2016-02-26 DIAGNOSIS — N6320 Unspecified lump in the left breast, unspecified quadrant: Secondary | ICD-10-CM

## 2016-02-26 DIAGNOSIS — N63 Unspecified lump in unspecified breast: Secondary | ICD-10-CM

## 2016-02-26 MED ORDER — SENNOSIDES-DOCUSATE SODIUM 8.6-50 MG PO TABS: 1.0000 | ORAL_TABLET | Freq: Two times a day (BID) | ORAL | 5 refills | Status: DC

## 2016-02-26 NOTE — Telephone Encounter (Signed)
REFILL 

## 2016-03-07 ENCOUNTER — Other Ambulatory Visit: Payer: Self-pay | Admitting: Family Medicine

## 2016-03-19 ENCOUNTER — Encounter (HOSPITAL_COMMUNITY): Payer: Self-pay

## 2016-03-19 ENCOUNTER — Emergency Department (HOSPITAL_COMMUNITY): Payer: Medicare Other

## 2016-03-19 ENCOUNTER — Other Ambulatory Visit: Payer: Self-pay

## 2016-03-19 ENCOUNTER — Emergency Department (HOSPITAL_COMMUNITY)
Admission: EM | Admit: 2016-03-19 | Discharge: 2016-03-19 | Disposition: A | Discharge status: Home / Self Care | Payer: Medicare Other | Attending: Emergency Medicine | Admitting: Emergency Medicine

## 2016-03-19 DIAGNOSIS — Z87891 Personal history of nicotine dependence: Secondary | ICD-10-CM | POA: Insufficient documentation

## 2016-03-19 DIAGNOSIS — I639 Cerebral infarction, unspecified: Secondary | ICD-10-CM | POA: Diagnosis not present

## 2016-03-19 DIAGNOSIS — R509 Fever, unspecified: Secondary | ICD-10-CM

## 2016-03-19 DIAGNOSIS — R519 Headache, unspecified: Secondary | ICD-10-CM

## 2016-03-19 DIAGNOSIS — Z7901 Long term (current) use of anticoagulants: Secondary | ICD-10-CM | POA: Insufficient documentation

## 2016-03-19 DIAGNOSIS — I252 Old myocardial infarction: Secondary | ICD-10-CM | POA: Diagnosis not present

## 2016-03-19 DIAGNOSIS — I2510 Atherosclerotic heart disease of native coronary artery without angina pectoris: Secondary | ICD-10-CM | POA: Insufficient documentation

## 2016-03-19 DIAGNOSIS — I110 Hypertensive heart disease with heart failure: Secondary | ICD-10-CM | POA: Insufficient documentation

## 2016-03-19 DIAGNOSIS — Z8673 Personal history of transient ischemic attack (TIA), and cerebral infarction without residual deficits: Secondary | ICD-10-CM | POA: Insufficient documentation

## 2016-03-19 DIAGNOSIS — R51 Headache: Secondary | ICD-10-CM | POA: Diagnosis present

## 2016-03-19 DIAGNOSIS — Z79899 Other long term (current) drug therapy: Secondary | ICD-10-CM | POA: Diagnosis not present

## 2016-03-19 DIAGNOSIS — Z955 Presence of coronary angioplasty implant and graft: Secondary | ICD-10-CM | POA: Diagnosis not present

## 2016-03-19 DIAGNOSIS — Z7982 Long term (current) use of aspirin: Secondary | ICD-10-CM | POA: Diagnosis not present

## 2016-03-19 DIAGNOSIS — I5042 Chronic combined systolic (congestive) and diastolic (congestive) heart failure: Secondary | ICD-10-CM | POA: Diagnosis not present

## 2016-03-19 LAB — COMPREHENSIVE METABOLIC PANEL
ALT: 37 U/L (ref 17–63)
AST: 41 U/L (ref 15–41)
Albumin: 3.5 g/dL (ref 3.5–5.0)
Alkaline Phosphatase: 46 U/L (ref 38–126)
Anion gap: 9 (ref 5–15)
BUN: 13 mg/dL (ref 6–20)
CO2: 23 mmol/L (ref 22–32)
Calcium: 8.9 mg/dL (ref 8.9–10.3)
Chloride: 103 mmol/L (ref 101–111)
Creatinine, Ser: 1.07 mg/dL (ref 0.61–1.24)
GFR calc Af Amer: >60 mL/min (ref >60)
GFR calc non Af Amer: >60 mL/min (ref >60)
Glucose, Bld: 134 mg/dL — ABNORMAL HIGH (ref 65–99)
Potassium: 3.8 mmol/L (ref 3.5–5.1)
Sodium: 135 mmol/L (ref 135–145)
Total Bilirubin: 0.5 mg/dL (ref 0.3–1.2)
Total Protein: 6.9 g/dL (ref 6.5–8.1)

## 2016-03-19 LAB — CBC WITH DIFFERENTIAL/PLATELET
Basophils Absolute: 0 10*3/uL (ref 0.0–0.1)
Basophils Relative: 0 %
Eosinophils Absolute: 0 10*3/uL (ref 0.0–0.7)
Eosinophils Relative: 0 %
HCT: 38.4 % — ABNORMAL LOW (ref 39.0–52.0)
Hemoglobin: 12.2 g/dL — ABNORMAL LOW (ref 13.0–17.0)
Lymphocytes Relative: 15 %
Lymphs Abs: 0.7 10*3/uL (ref 0.7–4.0)
MCH: 27.3 pg (ref 26.0–34.0)
MCHC: 31.8 g/dL (ref 30.0–36.0)
MCV: 85.9 fL (ref 78.0–100.0)
Monocytes Absolute: 0.2 10*3/uL (ref 0.1–1.0)
Monocytes Relative: 4 %
Neutro Abs: 3.9 10*3/uL (ref 1.7–7.7)
Neutrophils Relative %: 81 %
Platelets: 153 10*3/uL (ref 150–400)
RBC: 4.47 MIL/uL (ref 4.22–5.81)
RDW: 13.5 % (ref 11.5–15.5)
WBC: 4.8 10*3/uL (ref 4.0–10.5)

## 2016-03-19 LAB — INFLUENZA PANEL BY PCR (TYPE A & B)
Influenza A By PCR: NEGATIVE
Influenza B By PCR: NEGATIVE

## 2016-03-19 LAB — URINALYSIS, ROUTINE W REFLEX MICROSCOPIC
Bacteria, UA: NONE SEEN
Bilirubin Urine: NEGATIVE
Glucose, UA: NEGATIVE mg/dL
Hgb urine dipstick: NEGATIVE
Ketones, ur: 5 mg/dL — AB
Leukocytes, UA: NEGATIVE
Nitrite: NEGATIVE
Protein, ur: 30 mg/dL — AB
Specific Gravity, Urine: 1.021 (ref 1.005–1.030)
pH: 7 (ref 5.0–8.0)

## 2016-03-19 LAB — TROPONIN I: Troponin I: <0.03 ng/mL (ref <0.03)

## 2016-03-19 LAB — I-STAT CG4 LACTIC ACID, ED: Lactic Acid, Venous: 1.87 mmol/L (ref 0.5–1.9)

## 2016-03-19 MED ORDER — GADOBENATE DIMEGLUMINE 529 MG/ML IV SOLN
20.0000 mL | Freq: Once | INTRAVENOUS | Status: AC | PRN
  Administered 2016-03-19: 20 mL via INTRAVENOUS

## 2016-03-19 MED ORDER — SODIUM CHLORIDE 0.9 % IV SOLN
INTRAVENOUS | Status: DC
  Administered 2016-03-19: 12:00:00 via INTRAVENOUS

## 2016-03-19 MED ORDER — IBUPROFEN 800 MG PO TABS
800.0000 mg | ORAL_TABLET | Freq: Once | ORAL | Status: AC
  Administered 2016-03-19: 800 mg via ORAL
  Filled 2016-03-19: qty 1

## 2016-03-19 MED ORDER — MORPHINE SULFATE (PF) 4 MG/ML IV SOLN
4.0000 mg | Freq: Once | INTRAVENOUS | Status: AC
  Administered 2016-03-19: 4 mg via INTRAVENOUS
  Filled 2016-03-19: qty 1

## 2016-03-19 MED ORDER — ACETAMINOPHEN 500 MG PO TABS
1000.0000 mg | ORAL_TABLET | Freq: Once | ORAL | Status: AC
  Administered 2016-03-19: 1000 mg via ORAL
  Filled 2016-03-19: qty 2

## 2016-03-19 MED ORDER — SODIUM CHLORIDE 0.9 % IV BOLUS (SEPSIS)
1000.0000 mL | Freq: Once | INTRAVENOUS | Status: AC
  Administered 2016-03-19: 1000 mL via INTRAVENOUS

## 2016-03-19 MED ORDER — ONDANSETRON HCL 4 MG/2ML IJ SOLN
4.0000 mg | Freq: Once | INTRAMUSCULAR | Status: AC
  Administered 2016-03-19: 4 mg via INTRAVENOUS
  Filled 2016-03-19: qty 2

## 2016-03-19 MED ORDER — PROCHLORPERAZINE EDISYLATE 5 MG/ML IJ SOLN
10.0000 mg | Freq: Once | INTRAMUSCULAR | Status: AC
  Administered 2016-03-19: 10 mg via INTRAVENOUS
  Filled 2016-03-19: qty 2

## 2016-03-19 MED ORDER — DEXTROSE 5 % IV SOLN
2.0000 g | Freq: Once | INTRAVENOUS | Status: AC
  Administered 2016-03-19: 2 g via INTRAVENOUS
  Filled 2016-03-19: qty 2

## 2016-03-19 MED ORDER — PROCHLORPERAZINE MALEATE 10 MG PO TABS: 10.0000 mg | ORAL_TABLET | Freq: Three times a day (TID) | ORAL | 0 refills | Status: DC | PRN

## 2016-03-19 NOTE — ED Notes (Signed)
Pt returned to room from CT

## 2016-03-19 NOTE — ED Notes (Signed)
Given ice chips per patients request

## 2016-03-19 NOTE — ED Provider Notes (Signed)
[facility] DEPT Provider Note   CSN: [id number] Arrival date & time: 03/19/16  [id number]     History   Chief Complaint Chief Complaint  Patient presents with  . Headache  . Blurred Vision    HPI Gregory Wiley is a 66 y.o. male.  Pt presents to the ED today with headache and blurred vision.  Sx started around 2100.  He has been feeling fatigued for the past few days.  He thinks he may have overdone it over the holiday weekend.  The pt describes his blurry vision as in both eyes.  No visual field defects that he can detect.  He also feels very depressed and weepy.  The pt's bp did elevate last night to sbp in the 150s.   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  He is on Eliquis for that stroke.  No hx of a.fib.      Past Medical History:  Diagnosis Date  . Adrenal insufficiency ([place])   . Anxiety   . Basal cell carcinoma of skin of left ankle   . Bleeding stomach ulcer 2015  . Coronary artery disease    stent, then CABG 07/20/10  . Daily headache    "for the past month" (11/11/2014)  . Depression   . Diverticulosis   . GERD (gastroesophageal reflux disease)   . Gout   . H/O cardiomyopathy    ischemic, now last echo 07/30/11, EF 38%W  . History of hiatal hernia   . Hyperlipidemia   . Hypertension   . Internal hemorrhoids   . Left hemiplegia ([place])   . NSTEMI (non-ST elevated myocardial infarction) ([place])    NSTEMI- last cath 06/2011-Stent to LCX-DES, last nuc 07/30/11 low risk  . Obesity (BMI 30-39.9)   . OSA on CPAP    since July 2013- uses CPAP sometimes , states he has lost 147 lbs. since stroke & doesn't use the CPAP as much as he use to.   . Plantar fasciitis of left foot   . Pneumonia    hosp. 12/2014  . Pulmonary embolism ([place]) 03/2014  . Stroke ([place]) 01/2014   "no control of LUE, can slightly LLE; memory problems since" (11/11/2014)  . Tubular adenoma of colon     Patient Active Problem List   Diagnosis Date Noted  . Breast mass 02/21/2016    . Exposure to TB 02/21/2016  . Vitamin D deficiency 02/21/2016  . Anemia 11/10/2015  . Left thigh pain 11/10/2015  . Abnormal x-ray 11/10/2015  . Neck pain 10/12/2015  . Hemiplegia affecting left side in left-dominant patient as late effect of cerebrovascular disease ([place]) 09/03/2015  . Welcome to Medicare preventive visit 08/14/2015  . Acute pulmonary embolism ([place]) 07/25/2015  . Atypical chest pain   . Chest pain 07/15/2015  . History of pulmonary embolism 07/15/2015  . Adrenal insufficiency ([place]) 07/15/2015  . Hematoma of rectus sheath 07/15/2015  . Bacteremia due to Gram-positive bacteria 07/03/2015  . Chronic anticoagulation 05/22/2015  . DNR (do not resuscitate) 01/19/2015  . OSA (obstructive sleep apnea) 11/30/2014  . Chronic combined systolic and diastolic congestive heart failure ([place]) 11/11/2014  . Hemiplegia affecting left dominant side ([place]) 02/16/2014  . 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 06/30/2011    Past Surgical History:  Procedure Laterality Date  . BASAL CELL CARCINOMA EXCISION Left 2015   ankle  . CARDIAC SURGERY    .  COLONOSCOPY N/A 02/10/2013   Procedure: COLONOSCOPY;  Surgeon: Ladene Artist, MD;  Location: [facility];  Service: Endoscopy;  Laterality: N/A;  . CORONARY ANGIOPLASTY WITH STENT PLACEMENT  07/01/2011   DES-Resolute to native LCX  . CORONARY ANGIOPLASTY WITH STENT PLACEMENT  12/01/2007   COMPLEX 5 LESION PCI INCLUDING CUTTING BALLOON AND 4 CYPHER DESs  . CORONARY ARTERY BYPASS GRAFT  07/20/2010    LIMA-LAD; VG-ACUTE MARG of RCA; Seq VG-distal RCA & then pda  . EP IMPLANTABLE DEVICE N/A 02/14/2016   Procedure: Loop Recorder Removal;  Surgeon: Thompson Grayer, MD;  Location: [facility] CV LAB;  Service: Cardiovascular;  Laterality: N/A;  . ESOPHAGOGASTRODUODENOSCOPY  01/2014   gastric ulcer, erosive gastroduodenitis, H Pylori negative.   . ESOPHAGOGASTRODUODENOSCOPY (EGD) WITH  PROPOFOL N/A 11/14/2014   Procedure: ESOPHAGOGASTRODUODENOSCOPY (EGD) WITH PROPOFOL;  Surgeon: Milus Banister, MD;  Location: [facility];  Service: Endoscopy;  Laterality: N/A;  . HAND SURGERY Right    to take glass out  . HERNIA REPAIR    . INTRAMEDULLARY (IM) NAIL INTERTROCHANTERIC Right 02/07/2015   Procedure: INTRAMEDULLARY (IM) NAIL INTERTROCHANTRIC RIGHT HIP;  Surgeon: Renette Butters, MD;  Location: [facility];  Service: Orthopedics;  Laterality: Right;  . KNEE ARTHROSCOPY Right   . LEFT HEART CATHETERIZATION WITH CORONARY/GRAFT ANGIOGRAM  07/01/2011   Procedure: LEFT HEART CATHETERIZATION WITH Beatrix Fetters;  Surgeon: Sanda Klein, MD;  Location: [facility] CATH LAB;  Service: Cardiovascular;;  . LOOP RECORDER IMPLANT  02/15/2014   MDT LINQ implanted by Dr Rayann Heman for cryptogenic stroke  . PERCUTANEOUS CORONARY STENT INTERVENTION (PCI-S) Right 07/01/2011   Procedure: PERCUTANEOUS CORONARY STENT INTERVENTION (PCI-S);  Surgeon: Sanda Klein, MD;  Location: [facility] CATH LAB;  Service: Cardiovascular;  Laterality: Right;  . PICC LINE PLACE PERIPHERAL ([place] HX)  06/2015  . RETINAL DETACHMENT SURGERY Right   . TEE WITHOUT CARDIOVERSION N/A 02/15/2014   Procedure: TRANSESOPHAGEAL ECHOCARDIOGRAM (TEE);  Surgeon: Josue Hector, MD;  Location: [facility];  Service: Cardiovascular;  Laterality: N/A;  . UMBILICAL HERNIA REPAIR  2006       Home Medications    Prior to Admission medications   Medication Sig Start Date End Date Taking? Authorizing Provider  acetaminophen (TYLENOL) 325 MG tablet Take 1 tablet (325 mg total) by mouth every 6 (six) hours as needed for mild pain. 12/27/14  Yes Florencia Reasons, MD  apixaban (ELIQUIS) 5 MG TABS tablet Take 1 tablet (5 mg total) by mouth 2 (two) times daily. Start taking May 8th, 2017. 07/25/15  Yes Erlene Quan, [place]  aspirin 81 MG EC tablet Take 1 tablet by mouth daily 10/24/15  Yes [name], MD  fludrocortisone (FLORINEF) 0.1 MG tablet TAKE 1 TABLET BY  MOUTH 2 TIMES DAILY. 12/01/15  Yes Tonia Ghent, MD  isoniazid (NYDRAZID) 300 MG tablet Take 1 tablet (300 mg total) by mouth daily. For 4 more months 02/07/16  Yes Tonia Ghent, MD  Melatonin 10 MG CAPS Take 10 mg by mouth at bedtime as needed (sleep). 02/07/16  Yes Tonia Ghent, MD  midodrine (PROAMATINE) 2.5 MG tablet Take 1 tablet (2.5 mg total) by mouth 3 (three) times daily with meals. 11/05/15  Yes Tonia Ghent, MD  nitroGLYCERIN (NITROSTAT) 0.4 MG SL tablet Place 1 tablet (0.4 mg total) under the tongue every 5 (five) minutes as needed. For chest pain Patient taking differently: Place 0.4 mg under the tongue every 5 (five) minutes as needed for chest pain.  05/21/15  Yes Tonia Ghent, MD  pantoprazole (PROTONIX) 40 MG tablet TAKE 1 TABLET BY MOUTH 2 TIMES DAILY. 02/26/16  Yes [name], MD  potassium chloride (K-DUR) 10 MEQ tablet TAKE 2 TABLETS BY MOUTH 3 TIMES DAILY. 03/07/16  Yes Tonia Ghent, MD  PRESCRIPTION MEDICATION Inhale into the lungs at bedtime. CPAP   Yes Historical Provider, MD  pyridOXINE (VITAMIN B-6) 50 MG tablet Take 1 tablet (50 mg total) by mouth daily. For 4 more months 02/07/16  Yes Tonia Ghent, MD  rosuvastatin (CRESTOR) 40 MG tablet Take 1 tablet (40 mg total) by mouth daily. 06/30/15  Yes [name], MD  senna (SENOKOT) 8.6 MG TABS tablet Take 1 tablet by mouth 2 (two) times daily.   Yes Historical Provider, MD  senna-docusate (SENNA S) 8.6-50 MG tablet Take 1 tablet by mouth 2 (two) times daily. 02/26/16  Yes Tonia Ghent, MD  sertraline (ZOLOFT) 50 MG tablet Take 50 mg by mouth daily. 02/26/16  Yes Historical Provider, MD  prochlorperazine (COMPAZINE) 10 MG tablet Take 1 tablet (10 mg total) by mouth every 8 (eight) hours as needed (headache, nausea or vomiting). 03/19/16   Forde Dandy, MD    Family History Family History  Problem Relation Age of Onset  . Diabetes type II Mother   . CAD Father 106    CABG, PCI, died at 37  . Heart  attack Father 38  . Hypertension Brother   . Diabetes Brother   . Hyperlipidemia Brother   . Brain cancer Maternal Grandmother   . COPD Paternal Grandfather   . Coronary artery disease    . Other      denies family history of DVT/PE  . Colon cancer Neg Hx   . Stroke Neg Hx     Social History Social History  Substance Use Topics  . Smoking status: Former Smoker    Packs/day: 2.00    Years: 4.00    Types: Cigarettes    Quit date: 01/30/1969  . Smokeless tobacco: Never Used     Comment: "quit smoking cigarettes  in the 1970's"  . Alcohol use No     Allergies   Diphenhydramine hcl and Adhesive [tape]   Review of Systems Review of Systems  Constitutional: Positive for fatigue.  Eyes: Positive for visual disturbance.  Neurological: Positive for weakness.  All other systems reviewed and are negative.    Physical Exam Updated Vital Signs BP (!) 105/50   Pulse 70   Temp 99 F (37.2 C) (Oral)   Resp 16   Ht 6' (1.829 m)   Wt 205 lb (93 kg)   SpO2 91%   BMI 27.80 kg/m   Physical Exam  Constitutional: He is oriented to person, place, and time. He appears well-developed and well-nourished.  HENT:  Head: Normocephalic and atraumatic.  Right Ear: External ear normal.  Left Ear: External ear normal.  Nose: Nose normal.  Mouth/Throat: Oropharynx is clear and moist.  Eyes: Conjunctivae and EOM are normal. Pupils are equal, round, and reactive to light.  Neck: Normal range of motion. Neck supple.  Cardiovascular: Normal rate, regular rhythm, normal heart sounds and intact distal pulses.   Pulmonary/Chest: Effort normal and breath sounds normal.  Abdominal: Soft. Bowel sounds are normal.  Neurological: He is alert and oriented to person, place, and time.  flacid LUE (chronic).  Weak LLE (chronic).  No new weakness.  Skin: Skin is warm.  Psychiatric: He has a normal mood and  affect. His behavior is normal. Judgment and thought content normal.  Nursing note and vitals  reviewed.    ED Treatments / Results  Labs (all labs ordered are listed, but only abnormal results are displayed) Labs Reviewed  CBC WITH DIFFERENTIAL/PLATELET - Abnormal; Notable for the following:       Result Value   Hemoglobin 12.2 (*)    HCT 38.4 (*)    All other components within normal limits  COMPREHENSIVE METABOLIC PANEL - Abnormal; Notable for the following:    Glucose, Bld 134 (*)    All other components within normal limits  URINALYSIS, ROUTINE W REFLEX MICROSCOPIC - Abnormal; Notable for the following:    Ketones, ur 5 (*)    Protein, ur 30 (*)    Squamous Epithelial / LPF 0-5 (*)    All other components within normal limits  CULTURE, BLOOD (ROUTINE X 2)  CULTURE, BLOOD (ROUTINE X 2)  TROPONIN I  INFLUENZA PANEL BY PCR (TYPE A & B, H1N1)  I-STAT CG4 LACTIC ACID, ED    EKG  EKG Interpretation  Date/Time:  Tuesday March 19 2016 07:44:20 EST Ventricular Rate:  77 PR Interval:    QRS Duration: 95 QT Interval:  422 QTC Calculation: 478 R Axis:   -29 Text Interpretation:  Sinus rhythm Borderline left axis deviation Low voltage, precordial leads Anteroseptal infarct, old Abnormal T, consider ischemia, lateral leads No significant change since last tracing Confirmed by [facility] MD, Annalee Meyerhoff ([id number]) on 03/19/2016 7:52:21 AM Also confirmed by Gilford Raid MD, Justyce Baby ([id number]), editor [place] CT, Leda Gauze [phone])  on 03/19/2016 8:07:06 AM       Radiology Dg Chest 2 View  Result Date: 03/19/2016 CLINICAL DATA:  Blurred vision and headaches since 9 p.m. last night with similar symptoms 2 years ago prior to his most recent CVA. Patient has left hemiparesis as well as generalized weakness. History of pulmonary embolism in April 2017. EXAM: CHEST  2 VIEW COMPARISON:  Chest x-ray of July 15, 2015 and chest CT scan of June 19, 2015. FINDINGS: The lungs are well-expanded. There is no focal infiltrate. There is no pleural effusion. The heart and pulmonary vascularity are normal. The  mediastinum is normal in width. The trachea is midline. The sternal wires are intact. The observed bony thorax exhibits no acute abnormality. IMPRESSION: There is no active cardiopulmonary disease. Electronically Signed   By: David  Martinique M.D.   On: 03/19/2016 08:34   Ct Head Wo Contrast  Result Date: 03/19/2016 CLINICAL DATA:  Posterior headache and blurry vision since 2100 hours last night. EXAM: CT HEAD WITHOUT CONTRAST TECHNIQUE: Contiguous axial images were obtained from the base of the skull through the vertex without intravenous contrast. COMPARISON:  MRI 11/13/2015.  CT scan 12/20/2014. FINDINGS: Brain: Old right MCA territory infarct again noted. Scattered foci of increased attenuation are identified adjacent to the right lateral ventricle, in the region of prior infarct, likely related to calcification. Ex vacuo dilatation right lateral ventricle has progressed in the interval. No evidence for acute ischemia, abnormal extra-axial fluid collection, or abnormal mass lesion. Diffuse loss of parenchymal volume is consistent with atrophy. Patchy low attenuation in the deep hemispheric and periventricular white matter is nonspecific, but likely reflects chronic microvascular ischemic demyelination. Vascular: No hyperdense vessel or unexpected calcification. Skull: No evidence for fracture. No worrisome lytic or sclerotic lesion. Sinuses/Orbits: Chronic mucosal disease identified in the maxillary sinuses. Tiny air-fluid level seen right sphenoid sinus. Mastoid air cells are clear bilaterally. Visualized portions of the globes  and intraorbital fat are unremarkable. Other: None. IMPRESSION: Old right MCA infarct with new foci of increased density in adjacent to the right lateral ventricle, in the region of the prior infarct. This is probably dystrophic calcification but small volume acute hemorrhage could present similarly. Follow-up CT in 24 hours could be used to assess for interval evolution. Alternatively,  brain MRI may be helpful to further evaluate. Electronically Signed   By: Misty Stanley M.D.   On: 03/19/2016 09:05   Mr Jeri Cos [id number] Contrast  Result Date: 03/19/2016 CLINICAL DATA:  66 y/o  M; headache and blurred vision. EXAM: MRI HEAD WITHOUT AND WITH CONTRAST TECHNIQUE: Multiplanar, multiecho pulse sequences of the brain and surrounding structures were obtained without and with intravenous contrast. CONTRAST:  86mL MULTIHANCE GADOBENATE DIMEGLUMINE 529 MG/ML IV SOLN COMPARISON:  03/19/2016 CT head. FINDINGS: Brain: Density along the right lateral ventricle on prior CT demonstrates intermediate T1, low T2 signal with faint susceptibility blooming measuring 6 mm most consistent with acute hemorrhage (series 9, image 70, series 6, image 18, series 8, image 18). Chronic hemosiderin stained right corona radiata, lentiform nucleus, insula, and anterior temporal lobe infarct. Ex vacuo dilatation of the right lateral ventricle. Wallerian degeneration of the right cortical spinal tracts. No diffusion signal abnormality. No additional abnormal susceptibility hypointensity to suggest intracranial hemorrhage. No extra-axial collection or effacement of basilar cisterns. No significant focal mass effect. Background of moderate chronic microvascular ischemic changes and parenchymal volume loss of the brain. Vascular: Normal flow voids. Skull and upper cervical spine: Normal marrow signal. Sinuses/Orbits: Mild diffuse paranasal sinus mucosal thickening greatest in ethmoid and maxillary sinuses and small fluid level within the right sphenoid sinus. Right intra-ocular lens replacement. No significant abnormal signal of mastoid air cells. Other: None. IMPRESSION: 1. 6 mm density along the right lateral ventricle on prior CT demonstrates signal characteristics most consistent with a acute hemorrhage. Follow-up CT of the head is recommended to ensure stability. 2. No additional acute intracranial abnormality identified. 3. Stable  chronic infarct of right anterior temporal lobe and basal ganglia. Stable moderate chronic microvascular ischemic changes and parenchymal volume loss of the brain. 4. Paranasal sinus disease with a fluid level which may represent acute sinusitis in the appropriate clinical setting. These results were called by telephone at the time of interpretation on 03/19/2016 at 7:03 pm to Dr. Oleta Mouse, who verbally acknowledged these results. Electronically Signed   By: Kristine Garbe M.D.   On: 03/19/2016 19:04    Procedures Procedures (including critical care time)  Medications Ordered in ED Medications  sodium chloride 0.9 % bolus 1,000 mL (0 mLs Intravenous Stopped 03/19/16 1400)  acetaminophen (TYLENOL) tablet 1,000 mg (1,000 mg Oral Given 03/19/16 0857)  morphine 4 MG/ML injection 4 mg (4 mg Intravenous Given 03/19/16 1011)  ondansetron (ZOFRAN) injection 4 mg (4 mg Intravenous Given 03/19/16 1011)  ibuprofen (ADVIL,MOTRIN) tablet 800 mg (800 mg Oral Given 03/19/16 1018)  cefTRIAXone (ROCEPHIN) 2 g in dextrose 5 % 50 mL IVPB (0 g Intravenous Stopped 03/19/16 1224)  morphine 4 MG/ML injection 4 mg (4 mg Intravenous Given 03/19/16 1132)  gadobenate dimeglumine (MULTIHANCE) injection 20 mL (20 mLs Intravenous Contrast Given 03/19/16 1831)  prochlorperazine (COMPAZINE) injection 10 mg (10 mg Intravenous Given 03/19/16 2200)     Initial Impression / Assessment and Plan / ED Course  I have reviewed the triage vital signs and the nursing notes.  Pertinent labs & imaging results that were available during my care of the patient were  reviewed by me and considered in my medical decision making (see chart for details).  Clinical Course    Pt with a headache and fever.  There is a possibility for meningitis.  However, pt is on Eliquis, so I don't think it's safe to do a LP without holding the Eliquis.  I will go ahead and treat him with rocephin and speak with the hospitalists for admission.  Pt d/w Dr. Marily Memos.  He  wanted to get a MRI brain first before admitting.  MRI is still pending at shift change.  Pt will be signed out to Dr. Oleta Mouse.   Final Clinical Impressions(s) / ED Diagnoses   Final diagnoses:  Fever, unspecified fever cause  Stroke ([place])  Nonintractable headache, unspecified chronicity pattern, unspecified headache type    New Prescriptions Discharge Medication List as of 03/19/2016 10:28 PM    START taking these medications   Details  prochlorperazine (COMPAZINE) 10 MG tablet Take 1 tablet (10 mg total) by mouth every 8 (eight) hours as needed (headache, nausea or vomiting)., Starting Tue 03/19/2016, Print         Isla Pence, MD 03/21/16 [phone]

## 2016-03-19 NOTE — Discharge Instructions (Signed)
Your infectious work-up today has been reassuring, and this may be viral illness. Continue tylenol and prescription compazine for headaches.  Please follow-up with your primary care doctor closely for recheck in the next few days.  Please return without fail for worsening symptoms, including confusion, escalating pain, or any other symptoms concerning to you.

## 2016-03-19 NOTE — ED Provider Notes (Signed)
Please see previous provider's note regarding patient's presenting history and physical, initial ED course, and associated MDM. In short this is a 66 year old male with prior CVA with residual left-sided hemiplegia and PE on Eliquis who presents with headache and blurry vision. In ED was noted to be febrile, but otherwise hemodynamically stable. Infectious workup was initiated by previous provider, and he had reassuring blood work with no leukocytosis, normal lactic acid, no major OM metabolic derangements, or endorgan damage. There was concern for potential meningitis and plan was for patient to undergo admission and potential IR guided LP once his blood thinner withheld. However hospitalist had wanted MRI to be done before admitting patient.  MRI visualized, and shows questionable punctate intracranial hemorrhage. I spoke with Dr. Cheral Marker about this from neurology. He states that this is chronic in nature, and that he does not require admission or follow-up imaging for this. He also states that he has no evidence of meningitis or encephalitis on MRI, and does not need LP. He does not feel patient requires admission from neurological standpoint.   Spoke with patient and examined him. He is well appearing. Received compazine for headache, and headache minimal now. He feels improved and states he would prefer to go home. Given no evidence of sepsis, and no concerns for meningitis/encephalitis at this time, patient will be discharged. He will follow-up with PCP in 1-2 day for recheck. Discussed strict return instructions. He expressed understanding of all discharge instructions and felt comfortable with the plan of care.       Forde Dandy, MD 03/19/16 2234

## 2016-03-19 NOTE — ED Triage Notes (Signed)
Pt presents for evaluation of HA and blurred vision starting around 2100 last night. Pt. Reports feeling fatigued x 2 days. Denies CP/SOB. Pt. Reports hx of CABG and CVA. Pt AxO x4.

## 2016-03-22 ENCOUNTER — Encounter: Payer: Self-pay | Admitting: Family Medicine

## 2016-03-22 ENCOUNTER — Ambulatory Visit (INDEPENDENT_AMBULATORY_CARE_PROVIDER_SITE_OTHER): Payer: Medicare Other | Admitting: Family Medicine

## 2016-03-22 DIAGNOSIS — H538 Other visual disturbances: Secondary | ICD-10-CM | POA: Diagnosis not present

## 2016-03-22 NOTE — Patient Instructions (Signed)
I don't have a clear cause for the recent event.  I wouldn't change your meds for now.  I would have you call the eye clinic about a routine check given the blurry vision.   Take care.  Glad to see you.

## 2016-03-22 NOTE — Progress Notes (Signed)
ER f/u.  Initially his ears and head got hot, then he had HA, blurry vision, stiff neck.  He went to ER about 1 day after sx started.  W/u neg at ER, d/w pt.  MRI, imaging d/w pt.    He was feeling well prior to the event, walking better and feeling well in general.  He still doesn't feel well in general, after the event, but with nonfocal sx.    L hand was prev cold since prev CVA years ago, now more recently with warmth in the L hand since the most recent event.   He has had recurrent ear popping with jaw opening.    Meds, vitals, and allergies reviewed.   ROS: Per HPI unless specifically indicated in ROS section   GEN: nad, alert and oriented HEENT: mucous membranes moist, PERRL, EOMI NECK: supple w/o LA CV: rrr. PULM: ctab, no inc wob ABD: soft, +bs SKIN: no acute rash L leg and arm weak at baseline.   Still with blurry vision in each eye independently, per report at time of exam.

## 2016-03-24 LAB — CULTURE, BLOOD (ROUTINE X 2)
Culture: NO GROWTH
Culture: NO GROWTH

## 2016-03-25 DIAGNOSIS — H538 Other visual disturbances: Secondary | ICD-10-CM | POA: Insufficient documentation

## 2016-03-25 NOTE — Assessment & Plan Note (Addendum)
Still without a clear cause for the recent symptoms. Discussed with patient. I want him to follow-up with eye clinic, to see if they can offer any advice. He does have some clicking of the TMJ with range of motion but no significant pain. His eardrums appeared normal. I don't see overt ear pathology that would cause concern. I question if he could've had an atypical migraine that caused some of his symptoms noted. Discussed with patient. This is not a clear diagnosis.  He does have warmth in the left hand noted, but no erythema and no concern for active cellulitis or ominous process. I don't have a good explanation for the change in his left hand, with more warmth recently noted.  >25 minutes spent in face to face time with patient, >50% spent in counselling or coordination of care.

## 2016-04-16 ENCOUNTER — Encounter: Payer: Self-pay | Admitting: Neurology

## 2016-04-16 ENCOUNTER — Ambulatory Visit (INDEPENDENT_AMBULATORY_CARE_PROVIDER_SITE_OTHER): Payer: Medicare Other | Admitting: Neurology

## 2016-04-16 VITALS — BP 131/76 | HR 62 | Resp 18 | Ht 72.0 in | Wt 211.0 lb

## 2016-04-16 DIAGNOSIS — G4719 Other hypersomnia: Secondary | ICD-10-CM

## 2016-04-16 DIAGNOSIS — G4733 Obstructive sleep apnea (adult) (pediatric): Secondary | ICD-10-CM | POA: Diagnosis not present

## 2016-04-16 DIAGNOSIS — I69952 Hemiplegia and hemiparesis following unspecified cerebrovascular disease affecting left dominant side: Secondary | ICD-10-CM

## 2016-04-16 DIAGNOSIS — I639 Cerebral infarction, unspecified: Secondary | ICD-10-CM

## 2016-04-16 DIAGNOSIS — M542 Cervicalgia: Secondary | ICD-10-CM | POA: Diagnosis not present

## 2016-04-16 DIAGNOSIS — Z7901 Long term (current) use of anticoagulants: Secondary | ICD-10-CM

## 2016-04-16 DIAGNOSIS — G8112 Spastic hemiplegia affecting left dominant side: Secondary | ICD-10-CM

## 2016-04-16 DIAGNOSIS — IMO0002 Reserved for concepts with insufficient information to code with codable children: Secondary | ICD-10-CM

## 2016-04-16 MED ORDER — SERTRALINE HCL 100 MG PO TABS: 100.0000 mg | ORAL_TABLET | Freq: Every day | ORAL | 11 refills | Status: DC

## 2016-04-16 NOTE — Progress Notes (Signed)
[facility]  PATIENT: Gregory Wiley DOB: Dec 03, 1950  REFERRING CLINICIAN: Tamsen Roers HISTORY FROM: patient   HISTORICAL  CHIEF COMPLAINT:  Chief Complaint  Patient presents with  . History of CVA    Denies new stroke sx.  He spent 3-4 weeks in a nsg. facility in October 2017, to focus on gait/balance.  Since then, sts. he has fallen twice, on 12-16-15 and 03-28-15.  Denies hitting head or LOC, but c/o persistent left hip, leg, lbp since the 03-27-16 fall/fim  . 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     Numbness:   He notes new numbness inside the mouth on the left.   The cheek does not feel numb on the outside.    This started one week ago and is persisting.  Stroke:   He denies any new stroke symptoms.    He is on Eliquis for stroke prophylaxis.    He denies any new stroke or TIA symptoms.   Stroke History:  On 02/12/2014, he woke up with weakness on the left side and was unable to stand up. He went to the emergency room.Marland Kitchen MRI of the brain showed an acute right MCA territory stroke that mostly involved the right temporal lobe with extension to the insula and the basal ganglia on the right. MR angiogram showed an occlusion of the right M1 segment  Of note, he had been on aspirin and Plavix that had been discontinued a few weeks earlier due to a bleeding ulcer.   Ultrasound of the carotid arteries showed less than 39% stenosis. A transesophageal echocardiogram showed a LVEF equals 45%, there was septal hypokinesia, no PFO or ASD was identified.  Cardioembolism was suspected.  He was initially placed on Plavix but that was subsequently changed to Xarelto after he was found to have pulmonary embolisms while at rehabilitation center.  Gait/Strength/sensation:  He has poor gait due to left hemiplegia.  He fell while walking with his cane.   His wife was able to help break the fall and  he did not hit his head.    He has had more neck pain since then.   The left side is weak.   With aa quad cane he can go > 50 feet most days but not everyday.       His leg also has left sided spasticity.   He also has numbness on the left side with some pain. He had some neglect and numbness initially that improved.   He has been on baclofen in the past but felt no benefit and felt very sleepy.    Neck pain/Headache:  Since his fall, he has had much more neck pain. Current pain is different than the occipital neuralgia type headache and neck pain he had in the past.   The occipital nerve blocks in the past greatly helped to reduce the severe headache and neck pain.    Bladder:  He has urinary frequency and urgency this year.   He has not had incontinence.  OSA/sleep/sleepiness:    He has obstructive sleep apnea but stopped CPAP as he does not like the mask.   He sleeps in his recliner.   he has lost > 100 pounds since the stroke.   He snores still despite weight loss.    He has excessive daytime sleepiness.  He first felt he had a benefit with methylphenidate 10 mg  by mouth twice a day but then he lost any benefit.    EDS is better if he uses CPAP as he did in the past.      Cognitive:   He and his wife note some decreased attention and decrease executive function abilities.  However, neglect has improved.  Mood:    He gets irritable easily. He tolerates the Zoloft well and doesn't think it has helped but only a little bit.    REVIEW OF SYSTEMS:  Constitutional: No fevers, chills, sweats, or change in appetite.  He sleeps poorly.    He is very tired Eyes: No visual changes, double vision, eye pain Ear, nose and throat: No hearing loss, ear pain, nasal congestion, sore throat Cardiovascular: No chest pain, palpitations Respiratory:  No shortness of breath at rest or with exertion.   No wheezes GastrointestinaI: No nausea, vomiting, diarrhea, abdominal pain, fecal incontinence Genitourinary:  No  dysuria, urinary retention or frequency.  No nocturia. Musculoskeletal:  No neck pain, back pain but he has shoulder and hip pain Integumentary: No rash, pruritus, skin lesions Neurological: as above Psychiatric: No depression at this time.  No anxiety Endocrine: No palpitations, diaphoresis, change in appetite, change in weigh.  He notes feeling cold and increased thirst Hematologic/Lymphatic:  No anemia, purpura, petechiae. Allergic/Immunologic: No itchy/runny eyes, nasal congestion, recent allergic reactions, rashes  ALLERGIES: Allergies  Allergen Reactions  . Diphenhydramine Hcl Anaphylaxis  . Adhesive [Tape] Other (See Comments)    Tears skin - please use paper tape    HOME MEDICATIONS: Outpatient Medications Prior to Visit  Medication Sig Dispense Refill  . acetaminophen (TYLENOL) 325 MG tablet Take 1 tablet (325 mg total) by mouth every 6 (six) hours as needed for mild pain.    Marland Kitchen apixaban (ELIQUIS) 5 MG TABS tablet Take 1 tablet (5 mg total) by mouth 2 (two) times daily. Start taking May 8th, 2017. 60 tablet 11  . aspirin 81 MG EC tablet Take 1 tablet by mouth daily 30 tablet 12  . fludrocortisone (FLORINEF) 0.1 MG tablet TAKE 1 TABLET BY MOUTH 2 TIMES DAILY. 60 tablet 2  . isoniazid (NYDRAZID) 300 MG tablet Take 1 tablet (300 mg total) by mouth daily. For 4 more months 120 tablet 0  . Melatonin 10 MG CAPS Take 10 mg by mouth at bedtime as needed (sleep). 90 capsule 3  . midodrine (PROAMATINE) 2.5 MG tablet Take 1 tablet (2.5 mg total) by mouth 3 (three) times daily with meals. 90 tablet 3  . nitroGLYCERIN (NITROSTAT) 0.4 MG SL tablet Place 1 tablet (0.4 mg total) under the tongue every 5 (five) minutes as needed. For chest pain (Patient taking differently: Place 0.4 mg under the tongue every 5 (five) minutes as needed for chest pain. ) 25 tablet 12  . pantoprazole (PROTONIX) 40 MG tablet TAKE 1 TABLET BY MOUTH 2 TIMES DAILY. 60 tablet 5  . potassium chloride (K-DUR) 10 MEQ tablet  TAKE 2 TABLETS BY MOUTH 3 TIMES DAILY. 180 tablet 5  . prochlorperazine (COMPAZINE) 10 MG tablet Take 1 tablet (10 mg total) by mouth every 8 (eight) hours as needed (headache, nausea or vomiting). 10 tablet 0  . pyridOXINE (VITAMIN B-6) 50 MG tablet Take 1 tablet (50 mg total) by mouth daily. For 4 more months 120 tablet 0  . rosuvastatin (CRESTOR) 40 MG tablet Take 1 tablet (40 mg total) by mouth daily. 90 tablet 3  . senna (SENOKOT) 8.6 MG TABS tablet Take 1 tablet by mouth  2 (two) times daily.    Marland Kitchen senna-docusate (SENNA S) 8.6-50 MG tablet Take 1 tablet by mouth 2 (two) times daily. 60 tablet 5  . sertraline (ZOLOFT) 50 MG tablet Take 50 mg by mouth daily.    Marland Kitchen PRESCRIPTION MEDICATION Inhale into the lungs at bedtime. CPAP     No facility-administered medications prior to visit.     PAST MEDICAL HISTORY: Past Medical History:  Diagnosis Date  . Adrenal insufficiency ([place])   . Anxiety   . Basal cell carcinoma of skin of left ankle   . Bleeding stomach ulcer 2015  . Coronary artery disease    stent, then CABG 07/20/10  . Daily headache    "for the past month" (11/11/2014)  . Depression   . Diverticulosis   . GERD (gastroesophageal reflux disease)   . Gout   . H/O cardiomyopathy    ischemic, now last echo 07/30/11, EF 38%W  . History of hiatal hernia   . Hyperlipidemia   . Hypertension   . Internal hemorrhoids   . Left hemiplegia ([place])   . NSTEMI (non-ST elevated myocardial infarction) ([place])    NSTEMI- last cath 06/2011-Stent to LCX-DES, last nuc 07/30/11 low risk  . Obesity (BMI 30-39.9)   . OSA on CPAP    since July 2013- uses CPAP sometimes , states he has lost 147 lbs. since stroke & doesn't use the CPAP as much as he use to.   . Plantar fasciitis of left foot   . Pneumonia    hosp. 12/2014  . Pulmonary embolism ([place]) 03/2014  . Stroke ([place]) 01/2014   "no control of LUE, can slightly LLE; memory problems since" (11/11/2014)  . Tubular adenoma of colon     PAST SURGICAL  HISTORY: Past Surgical History:  Procedure Laterality Date  . BASAL CELL CARCINOMA EXCISION Left 2015   ankle  . CARDIAC SURGERY    . COLONOSCOPY N/A 02/10/2013   Procedure: COLONOSCOPY;  Surgeon: Ladene Artist, MD;  Location: [facility];  Service: Endoscopy;  Laterality: N/A;  . CORONARY ANGIOPLASTY WITH STENT PLACEMENT  07/01/2011   DES-Resolute to native LCX  . CORONARY ANGIOPLASTY WITH STENT PLACEMENT  12/01/2007   COMPLEX 5 LESION PCI INCLUDING CUTTING BALLOON AND 4 CYPHER DESs  . CORONARY ARTERY BYPASS GRAFT  07/20/2010    LIMA-LAD; VG-ACUTE MARG of RCA; Seq VG-distal RCA & then pda  . EP IMPLANTABLE DEVICE N/A 02/14/2016   Procedure: Loop Recorder Removal;  Surgeon: Thompson Grayer, MD;  Location: [facility];  Service: Cardiovascular;  Laterality: N/A;  . ESOPHAGOGASTRODUODENOSCOPY  01/2014   gastric ulcer, erosive gastroduodenitis, H Pylori negative.   . ESOPHAGOGASTRODUODENOSCOPY (EGD) WITH PROPOFOL N/A 11/14/2014   Procedure: ESOPHAGOGASTRODUODENOSCOPY (EGD) WITH PROPOFOL;  Surgeon: Milus Banister, MD;  Location: [facility];  Service: Endoscopy;  Laterality: N/A;  . HAND SURGERY Right    to take glass out  . HERNIA REPAIR    . INTRAMEDULLARY (IM) NAIL INTERTROCHANTERIC Right 02/07/2015   Procedure: INTRAMEDULLARY (IM) NAIL INTERTROCHANTRIC RIGHT HIP;  Surgeon: Renette Butters, MD;  Location: [facility];  Service: Orthopedics;  Laterality: Right;  . KNEE ARTHROSCOPY Right   . LEFT HEART CATHETERIZATION WITH CORONARY/GRAFT ANGIOGRAM  07/01/2011   Procedure: LEFT HEART CATHETERIZATION WITH Beatrix Fetters;  Surgeon: Sanda Klein, MD;  Location: [facility];  Service: Cardiovascular;;  . LOOP RECORDER IMPLANT  02/15/2014   MDT LINQ implanted by Dr Rayann Heman for cryptogenic stroke  . PERCUTANEOUS CORONARY STENT INTERVENTION (PCI-S)  Right 07/01/2011   Procedure: PERCUTANEOUS CORONARY STENT INTERVENTION (PCI-S);  Surgeon: Sanda Klein, MD;  Location: [facility];  Service:  Cardiovascular;  Laterality: Right;  . PICC LINE PLACE PERIPHERAL ([place] HX)  06/2015  . RETINAL DETACHMENT SURGERY Right   . TEE WITHOUT CARDIOVERSION N/A 02/15/2014   Procedure: TRANSESOPHAGEAL ECHOCARDIOGRAM (TEE);  Surgeon: Josue Hector, MD;  Location: [facility];  Service: Cardiovascular;  Laterality: N/A;  . UMBILICAL HERNIA REPAIR  2006    FAMILY HISTORY: Family History  Problem Relation Age of Onset  . Diabetes type II Mother   . CAD Father 9    CABG, PCI, died at 86  . Heart attack Father 98  . Hypertension Brother   . Diabetes Brother   . Hyperlipidemia Brother   . Brain cancer Maternal Grandmother   . COPD Paternal Grandfather   . Coronary artery disease    . Other      denies family history of DVT/PE  . Colon cancer Neg Hx   . Stroke Neg Hx     SOCIAL HISTORY:  Social History   Social History  . Marital status: Married    Spouse name: N/A  . Number of children: 5  . Years of education: N/A   Occupational History  .  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 Smoking status: Former Smoker    Packs/day: 2.00    Years: 4.00    Types: Cigarettes    Quit date: 01/30/1969  . Smokeless tobacco: Never Used     Comment: "quit smoking cigarettes  in the 1970's"  . Alcohol use No  . Drug use: Yes    Types: Marijuana     Comment: "smoked some pot in college"  . Sexual activity: Not Currently   Other Topics Concern  . 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       PHYSICAL EXAM  Vitals:   04/16/16 1359  BP: 131/76  Pulse: 62  Resp: 18  Weight: 211 lb (95.7 kg)  Height: 6' (1.829 m)    Body mass index is 28.62 kg/m.   General: The patient is well-developed and well-nourished and in no acute distress.  He is in a wheelchair.  Neck:  No  carotid bruits are noted.  The neck is very tender on the left from occiput to lower caerv paraspinals and trapezous.  Rhomboids nontender.     Skin: He has mild edema at the left ankle.  Neurologic Exam  Mental status: The patient is alert and oriented x 3 at the time of the examination. The patient has apparent normal recent and remote memory. Attention is mildly reduced.   Speech is normal.  Cranial nerves: Extraocular movements are full. Saccadic pursuit is reduced to the left. PERRLA.  Facial strength is fairly symmetric. There is good facial sensation to soft touch bilaterally.  Trapezius and sternocleidomastoid strength is reduced on the left.. No dysarthria is noted.  No obvious hearing deficits are noted.  Motor:  Muscle bulk is normal. He has increased tone in the left arm and leg.  Arm is held in a flexed position with severe spasticity of the flexor muscles of the arm and flexors and extensors of the leg.  Strength is 5/5 on the right. Strength is 2/5 in left shoulder elevation and 1/5 in proximal arm strength. Strength is 3/5 in the quadriceps and 2/5 in other proximal muscles of the left leg  Sensory: Sensory testing is intact to  Touch and vibration sensation   Coordination: Cerebellar testing reveals good finger-nose-finger and heel-to-shin on the right and he cannot do these tasks on the left..  Gait and station: Station requires unilat support. He needs strong support to walk    Reflexes: Deep tendon reflexes are increased on the left.Marland Kitchen      DIAGNOSTIC DATA (LABS, IMAGING, TESTING) - I reviewed patient records, labs, notes, testing and imaging myself where available.  Lab Results  Component Value Date   WBC 4.8 03/19/2016   HGB 12.2 (L) 03/19/2016   HCT 38.4 (L) 03/19/2016   MCV 85.9 03/19/2016   PLT 153 03/19/2016      Component Value Date/Time   NA 135 03/19/2016 0751   K 3.8 03/19/2016 0751   CL 103 03/19/2016 0751   CO2 23 03/19/2016 0751   GLUCOSE 134 (H)  03/19/2016 0751   BUN 13 03/19/2016 0751   CREATININE 1.07 03/19/2016 0751   CREATININE 0.99 08/11/2015 1528   CALCIUM 8.9 03/19/2016 0751   PROT 6.9 03/19/2016 0751   ALBUMIN 3.5 03/19/2016 0751   AST 41 03/19/2016 0751   ALT 37 03/19/2016 0751   ALKPHOS 46 03/19/2016 0751   BILITOT 0.5 03/19/2016 0751   GFRNONAA >60 03/19/2016 0751   GFRNONAA >89 09/13/2014 1032   GFRAA >60 03/19/2016 0751   GFRAA >89 09/13/2014 1032   Lab Results  Component Value Date   CHOL 130 08/11/2015   HDL 49 08/11/2015   LDLCALC 66 08/11/2015   TRIG 75 08/11/2015   CHOLHDL 2.7 08/11/2015   Lab Results  Component Value Date   HGBA1C 5.6 02/13/2014   Lab Results  Component Value Date   VITAMINB12 286 07/05/2015   Lab Results  Component Value Date   TSH 1.035 12/23/2014        ASSESSMENT AND PLAN  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  Continue Eliquis for anticoagulation. 2.   Trigger point injection of the left splenius capitis, C4/C5/C6/C7 paraspinal muscles and trapezius muscles with 80 mg Depo-Medrol in [place]. He tolerated the procedure well and pain was much better afterwards. 3.   We discuss making sure at he walks around the house safely and not go further than he feels safe doing.     4.   I again advised him to use his CPAP on a regular basis for his obstructive sleep apnea and sleepiness. 5.    Increased Zoloft dose. 6.   He will return to see me in 6 months or sooner if he has new or worsening neurologic symptoms.  42 minutes face-to-face evaluation with greater than one half the  time counseling or coordinating care about his stroke and related symptoms.  Richard A. Felecia Shelling, MD, PhD [id number], [id number] PM Certified in  Neurology, Clinical Neurophysiology, Sleep Medicine, Pain Medicine and Neuroimaging  [facility] [address] [place], [place] [address] [phone]

## 2016-04-23 ENCOUNTER — Telehealth: Payer: Self-pay

## 2016-04-23 NOTE — Telephone Encounter (Signed)
Blanch Media with [facility] calls to confirm whether patient is positive for active Tb.     I returned call and LM at the extension provided explaining that the patient did have a positive quantiferon TB screening in September; however, he was asymptomatic and not categorized as active.  He is currently being treated for Latent TB and will finish abx therapy this March.    This information also confirmed by Dr. Damita Dunnings.  Blanch Media to call back if any further questions or concerns.

## 2016-04-30 ENCOUNTER — Other Ambulatory Visit: Payer: Self-pay | Admitting: Family Medicine

## 2016-04-30 NOTE — Telephone Encounter (Signed)
Received refill electronically Last refill 11/05/15 #90/3 Last office visit 03/22/16

## 2016-05-01 NOTE — Telephone Encounter (Signed)
Sent. Thanks.   

## 2016-05-28 ENCOUNTER — Other Ambulatory Visit: Payer: Self-pay | Admitting: Family Medicine

## 2016-05-28 NOTE — Telephone Encounter (Signed)
Received refill request electronically Last refill 12/01/15 #60/2 Last office visit 03/22/16

## 2016-05-29 NOTE — Telephone Encounter (Signed)
He shouldn't need f/u testing re: TB unless he has other symptoms, ie prolonged and worsening cough, hemoptysis, etc.   rx sent.   Reasonable to get patient OV to recheck his overall situation this summer.   Reasonable to recheck routine labs at any point now, with or without OV.  Future orders are in.  Thanks.

## 2016-05-29 NOTE — Telephone Encounter (Signed)
Gregory Wiley left v/m requesting status of refill for Fludrocortisone and when does pt need to be rechecked for testing positive for TB.

## 2016-05-29 NOTE — Telephone Encounter (Signed)
Patient's wife notified as instructed by telephone and verbalized understanding. Gregory Wiley stated that she will call back when she has her calendar in front of her and schedule a follow-up visit this summer and would like to have lab work done the same day.

## 2016-07-29 ENCOUNTER — Other Ambulatory Visit: Payer: Self-pay | Admitting: Cardiovascular Disease

## 2016-07-29 ENCOUNTER — Other Ambulatory Visit: Payer: Self-pay | Admitting: Cardiology

## 2016-07-30 NOTE — Telephone Encounter (Signed)
Rx(s) sent to pharmacy electronically.  

## 2016-08-20 ENCOUNTER — Telehealth: Payer: Self-pay | Admitting: Cardiovascular Disease

## 2016-08-20 MED ORDER — ROSUVASTATIN CALCIUM 40 MG PO TABS: 40.0000 mg | ORAL_TABLET | Freq: Every day | ORAL | 4 refills | Status: DC

## 2016-08-20 NOTE — Telephone Encounter (Signed)
New message     *STAT* If patient is at the pharmacy, call can be transferred to refill team.   1. Which medications need to be refilled? (please list name of each medication and dose if known) crestor 40 mg  2. Which pharmacy/location (including street and city if local pharmacy) is medication to be sent to? [facility]  3. Do they need a 30 day or 90 day supply? 30 day

## 2016-08-20 NOTE — Telephone Encounter (Signed)
Refill sent to the pharmacy electronically.  

## 2016-08-26 ENCOUNTER — Other Ambulatory Visit: Payer: Self-pay | Admitting: Family Medicine

## 2016-08-26 DIAGNOSIS — I63411 Cerebral infarction due to embolism of right middle cerebral artery: Secondary | ICD-10-CM

## 2016-08-27 ENCOUNTER — Telehealth: Payer: Self-pay | Admitting: Cardiovascular Disease

## 2016-08-27 ENCOUNTER — Other Ambulatory Visit: Payer: Self-pay | Admitting: Family Medicine

## 2016-08-27 NOTE — Telephone Encounter (Signed)
New message     Prior authorization has not been filled out for the Dr Claiborne Billings    Patient calling the office for samples of medication:   1.  What medication and dosage are you requesting samples for? Eliquis 5 mg   2.  Are you currently out of this medication?  Has 5 pills left enough for 2.5 days

## 2016-08-27 NOTE — Telephone Encounter (Signed)
Electronic refill request.  Last office visit:   03/22/2016 Last Filled:    60 tablet 3 05/29/2016  Please advise.

## 2016-08-27 NOTE — Telephone Encounter (Signed)
Returned call to patient's wife.Eliquis 5 mg samples left at [facility] office front desk. 

## 2016-08-29 NOTE — Telephone Encounter (Signed)
Sent. Thanks.   

## 2016-08-30 ENCOUNTER — Other Ambulatory Visit (INDEPENDENT_AMBULATORY_CARE_PROVIDER_SITE_OTHER): Payer: Medicare Other

## 2016-08-30 DIAGNOSIS — I63411 Cerebral infarction due to embolism of right middle cerebral artery: Secondary | ICD-10-CM | POA: Diagnosis not present

## 2016-08-30 DIAGNOSIS — E559 Vitamin D deficiency, unspecified: Secondary | ICD-10-CM

## 2016-08-30 DIAGNOSIS — Z862 Personal history of diseases of the blood and blood-forming organs and certain disorders involving the immune mechanism: Secondary | ICD-10-CM

## 2016-08-30 LAB — IBC PANEL
Iron: 14 ug/dL — ABNORMAL LOW (ref 42–165)
Saturation Ratios: 3.8 % — ABNORMAL LOW (ref 20.0–50.0)
Transferrin: 263 mg/dL (ref 212.0–360.0)

## 2016-08-30 LAB — COMPREHENSIVE METABOLIC PANEL
ALT: 9 U/L (ref 0–53)
AST: 16 U/L (ref 0–37)
Albumin: 3.8 g/dL (ref 3.5–5.2)
Alkaline Phosphatase: 39 U/L (ref 39–117)
BUN: 18 mg/dL (ref 6–23)
CO2: 27 mEq/L (ref 19–32)
Calcium: 9.1 mg/dL (ref 8.4–10.5)
Chloride: 107 mEq/L (ref 96–112)
Creatinine, Ser: 0.94 mg/dL (ref 0.40–1.50)
GFR: 85.16 mL/min (ref >60.00)
Glucose, Bld: 95 mg/dL (ref 70–99)
Potassium: 3.7 mEq/L (ref 3.5–5.1)
Sodium: 141 mEq/L (ref 135–145)
Total Bilirubin: 0.5 mg/dL (ref 0.2–1.2)
Total Protein: 6.7 g/dL (ref 6.0–8.3)

## 2016-08-30 LAB — CBC WITH DIFFERENTIAL/PLATELET
Basophils Absolute: 0 10*3/uL (ref 0.0–0.1)
Basophils Relative: 1.2 % (ref 0.0–3.0)
Eosinophils Absolute: 0.2 10*3/uL (ref 0.0–0.7)
Eosinophils Relative: 6.8 % — ABNORMAL HIGH (ref 0.0–5.0)
HCT: 30.4 % — ABNORMAL LOW (ref 39.0–52.0)
Hemoglobin: 9.7 g/dL — ABNORMAL LOW (ref 13.0–17.0)
Lymphocytes Relative: 26.9 % (ref 12.0–46.0)
Lymphs Abs: 1 10*3/uL (ref 0.7–4.0)
MCHC: 32 g/dL (ref 30.0–36.0)
MCV: 79 fl (ref 78.0–100.0)
Monocytes Absolute: 0.3 10*3/uL (ref 0.1–1.0)
Monocytes Relative: 7.7 % (ref 3.0–12.0)
Neutro Abs: 2.1 10*3/uL (ref 1.4–7.7)
Neutrophils Relative %: 57.4 % (ref 43.0–77.0)
Platelets: 136 10*3/uL — ABNORMAL LOW (ref 150.0–400.0)
RBC: 3.85 Mil/uL — ABNORMAL LOW (ref 4.22–5.81)
RDW: 16.1 % — ABNORMAL HIGH (ref 11.5–15.5)
WBC: 3.6 10*3/uL — ABNORMAL LOW (ref 4.0–10.5)

## 2016-08-30 LAB — LIPID PANEL
Cholesterol: 113 mg/dL (ref 0–200)
HDL: 48.1 mg/dL (ref >39.00)
LDL Cholesterol: 57 mg/dL (ref 0–99)
NonHDL: 64.99
Total CHOL/HDL Ratio: 2
Triglycerides: 40 mg/dL (ref 0.0–149.0)
VLDL: 8 mg/dL (ref 0.0–40.0)

## 2016-08-30 LAB — VITAMIN D 25 HYDROXY (VIT D DEFICIENCY, FRACTURES): VITD: 40.79 ng/mL (ref 30.00–100.00)

## 2016-09-04 ENCOUNTER — Encounter: Payer: Self-pay | Admitting: Family Medicine

## 2016-09-04 ENCOUNTER — Ambulatory Visit (INDEPENDENT_AMBULATORY_CARE_PROVIDER_SITE_OTHER): Payer: Medicare Other | Admitting: Family Medicine

## 2016-09-04 VITALS — BP 120/68 | HR 69 | Temp 98.5°F | Wt 187.2 lb

## 2016-09-04 DIAGNOSIS — F32A Depression, unspecified: Secondary | ICD-10-CM

## 2016-09-04 DIAGNOSIS — R399 Unspecified symptoms and signs involving the genitourinary system: Secondary | ICD-10-CM

## 2016-09-04 DIAGNOSIS — Z201 Contact with and (suspected) exposure to tuberculosis: Secondary | ICD-10-CM | POA: Diagnosis not present

## 2016-09-04 DIAGNOSIS — D509 Iron deficiency anemia, unspecified: Secondary | ICD-10-CM

## 2016-09-04 DIAGNOSIS — H9190 Unspecified hearing loss, unspecified ear: Secondary | ICD-10-CM

## 2016-09-04 DIAGNOSIS — E2740 Unspecified adrenocortical insufficiency: Secondary | ICD-10-CM | POA: Diagnosis not present

## 2016-09-04 DIAGNOSIS — F329 Major depressive disorder, single episode, unspecified: Secondary | ICD-10-CM | POA: Diagnosis not present

## 2016-09-04 DIAGNOSIS — I69352 Hemiplegia and hemiparesis following cerebral infarction affecting left dominant side: Secondary | ICD-10-CM | POA: Diagnosis not present

## 2016-09-04 MED ORDER — FERROUS SULFATE 325 (65 FE) MG PO TBEC: 325.0000 mg | DELAYED_RELEASE_TABLET | Freq: Every day | ORAL | 3 refills | Status: DC

## 2016-09-04 MED ORDER — TAMSULOSIN HCL 0.4 MG PO CAPS: 0.4000 mg | ORAL_CAPSULE | Freq: Every day | ORAL | 3 refills | Status: DC

## 2016-09-04 NOTE — Progress Notes (Signed)
Fatigued.  Wife thought he looked paler over the last few weeks.  He is up to the BR by himself now.  No bloody or black stools.  Still on eliquis.  Labs d/w pt.    Rare use of hydrocodone.  Used along with tizanidine for neck spasms and pain.    S/p treatment for TB screen positive.  No bloody sputum, no night sweats.    Also using tizanidine as needed for hiccups, prn dosing with relief.    Mood better with 100mg  sertraline.  Some days better than others.  D/w pt.  He has understandable frustration with CVA effects.    He was asking about prostate sx.  He is sleeping more in the day.  That may be contributing to his nighttime troubles with sleep.  Or his urination may truly be worse at night.  He dribbles after urination.  Stream is weaker than prev.  This is gradually getting worse in the interval, not acutely since the CVA. No burning with urination.    Ringing in the L ear with dec in hearing.  No trauma, no discharge.  No fevers.    Still with some dysphagia since his CVA.   He has had mult swallowing evals prev.  He is taking routine precautions.    PMH and SH reviewed  ROS: Per HPI unless specifically indicated in ROS section   Meds, vitals, and allergies reviewed.   nad ncat In wheelchair Speech at baseline.  TM wnl B Neck supple, no LA rrr ctab abd soft, not ttp, normal BS ext w/o edema.  L side flaccid at baseline.

## 2016-09-04 NOTE — Patient Instructions (Addendum)
Gregory Wiley will call about your referral. Go to the lab on the way out.  We'll contact you with your lab report, pick up stool kit.  Recheck labs in about 4 weeks.  Restart iron in the meantime.  Start flomax in the meantime and see if you can tolerate that and see if it helps with urination.  Take care.  Glad to see you.

## 2016-09-05 DIAGNOSIS — R399 Unspecified symptoms and signs involving the genitourinary system: Secondary | ICD-10-CM | POA: Insufficient documentation

## 2016-09-05 DIAGNOSIS — H9190 Unspecified hearing loss, unspecified ear: Secondary | ICD-10-CM | POA: Insufficient documentation

## 2016-09-05 NOTE — Assessment & Plan Note (Signed)
S/p treatment, no f/u needed now.

## 2016-09-05 NOTE — Assessment & Plan Note (Signed)
He was asking about functional neurology eval in [place] and I'll check on that.  D/w pt.  No change in meds.

## 2016-09-05 NOTE — Assessment & Plan Note (Signed)
Refer

## 2016-09-05 NOTE — Assessment & Plan Note (Signed)
Would continue fludrocortisone for now, at least until anemia is addressed vs resolved.  I don't want him to have lower BP.  He agrees.

## 2016-09-05 NOTE — Assessment & Plan Note (Signed)
Mood some better with SSRI, continue.

## 2016-09-05 NOTE — Assessment & Plan Note (Signed)
Gradually worse, presumed from BPH, would defer PSA at this point since he would be high risk to stop anticoagulation for eval if PSA elevated, d/w pt.  Start flomax with routine cautions and he'll update me.  He agrees.

## 2016-09-05 NOTE — Assessment & Plan Note (Addendum)
Iron def, presumed GI loss, d/w pt about options.  Check IFOB.  Restart iron.  Continue anticoagulation for now given his hx, d/w pt about risk and benefits.  Would not get colonoscopy done at this point given the risk of the test and his hx.  At this point, likely best/safest option is ongoing repletion and monitoring, he agrees. >40 minutes spent in face to face time with patient, >50% spent in counselling or coordination of care.   Recheck labs in a few weeks, see AVS.

## 2016-09-15 ENCOUNTER — Telehealth: Payer: Self-pay | Admitting: Family Medicine

## 2016-09-15 NOTE — Telephone Encounter (Signed)
Notify pt.  I checked into the [place] [facility] and it did not look promising.  I can't advise him to go.  I'll defer to him.

## 2016-09-16 ENCOUNTER — Telehealth: Payer: Self-pay | Admitting: *Deleted

## 2016-09-16 ENCOUNTER — Encounter: Payer: Self-pay | Admitting: Neurology

## 2016-09-16 ENCOUNTER — Ambulatory Visit (INDEPENDENT_AMBULATORY_CARE_PROVIDER_SITE_OTHER): Payer: Medicare Other | Admitting: Neurology

## 2016-09-16 VITALS — BP 103/71 | HR 85 | Resp 18 | Ht 72.0 in | Wt 188.0 lb

## 2016-09-16 DIAGNOSIS — G4733 Obstructive sleep apnea (adult) (pediatric): Secondary | ICD-10-CM

## 2016-09-16 DIAGNOSIS — F329 Major depressive disorder, single episode, unspecified: Secondary | ICD-10-CM

## 2016-09-16 DIAGNOSIS — Z7901 Long term (current) use of anticoagulants: Secondary | ICD-10-CM

## 2016-09-16 DIAGNOSIS — I69952 Hemiplegia and hemiparesis following unspecified cerebrovascular disease affecting left dominant side: Secondary | ICD-10-CM

## 2016-09-16 DIAGNOSIS — M542 Cervicalgia: Secondary | ICD-10-CM | POA: Diagnosis not present

## 2016-09-16 DIAGNOSIS — F32A Depression, unspecified: Secondary | ICD-10-CM

## 2016-09-16 MED ORDER — ARMODAFINIL 200 MG PO TABS: ORAL_TABLET | ORAL | 5 refills | Status: DC

## 2016-09-16 MED ORDER — DOXEPIN HCL 10 MG PO CAPS
10.0000 mg | ORAL_CAPSULE | Freq: Every day | ORAL | 11 refills | Status: DC
Start: 2016-09-16 — End: 2017-09-11

## 2016-09-16 MED ORDER — FINASTERIDE 5 MG PO TABS: 5.0000 mg | ORAL_TABLET | Freq: Every day | ORAL | 3 refills | Status: DC

## 2016-09-16 NOTE — Telephone Encounter (Signed)
Usually patients don't have that much trouble with flomax but he clearly can't take it and they were correct to stop it.  Added to intolerance list.   Would be reasonable to try finasteride. That doesn't work as quickly but can help.  rx sent.  Thanks.  Thanks.

## 2016-09-16 NOTE — Addendum Note (Signed)
Addended by: Tonia Ghent on: 09/16/2016 02:07 PM   Modules accepted: Orders

## 2016-09-16 NOTE — Telephone Encounter (Signed)
Wife says you prescribed Flomax and the patient has been dizzy and vomiting with it.  Wife says she gave it to him at night thinking the side effects might pass during the night but he gets up in the morning vomiting.  Wife has discontinued the medication for now, awaiting further instructions.

## 2016-09-16 NOTE — Progress Notes (Signed)
[facility]  PATIENT: Gregory Wiley DOB: Mar 13, 1951  REFERRING CLINICIAN: Tamsen Roers HISTORY FROM: patient   HISTORICAL  CHIEF COMPLAINT:  Chief Complaint  Patient presents with  . History of CVA    Denies new stroke sx.  Sts. he is compliant with Eliquis. Sts. Fe is low--pcp is investigating this--he is due to subit a stool sample this week.  Sts. fatigue is worse.  Would like a referral to behavioral health. 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   He feels he is stable for the most part.     Stroke:   He denies any new stroke symptoms. He remains on Elavil risk for stroke prophylaxis. He has not had any transient ischemic attacks or other transient neurologic symptoms. He continues to have left hemiplegia and he gets slurred speech when he is tired.   Stroke History:  On 02/12/2014, he woke up with weakness on the left side and was unable to stand up. He went to the emergency room.Marland Kitchen MRI of the brain showed an acute right MCA territory stroke that mostly involved the right temporal lobe with extension to the insula and the basal ganglia on the right. MR angiogram showed an occlusion of the right M1 segment  Of note, he had been on aspirin and Plavix that had been discontinued a few weeks earlier due to a bleeding ulcer.   Ultrasound of the carotid arteries showed less than 39% stenosis. A transesophageal echocardiogram showed a LVEF equals 45%, there was septal hypokinesia, no PFO or ASD was identified.  Cardioembolism was suspected.  He was initially placed on Plavix but that was subsequently changed to Xarelto after he was found to have pulmonary embolisms while at rehabilitation center.  Gait/Strength/sensation:  Due to left hemiplegia, his gait is poor. He has had a few falls. With a quad cane he can walk about 30-50 feet if he is not tired and about 20-30 feet if he is  tired. His bathroom is 30 feet away.  In the past, he was having orthostatic hypotension and had some episodes of syncope.    His leg also has left sided spasticity.   He also has numbness on the left side with some pain. He had some neglect and numbness initially that improved.   He has been on baclofen in the past but felt no benefit and felt very sleepy.    Orthostatic hypotension:     He is on Florinef and midodrine.  He gets pre-syncope but no recent syncope.     Neck pain/Headache:  He continues to report a neck pain, better than last year but still daily.   In the past, occiptial nerve blocks helped to reduce the severe headache and neck pain.    Bladder:  He has urinary frequency and urgency this year.   He has not had incontinence.   Flomax was started but he flet dizzy and stopped.    OSA/sleep/sleepiness:    In the past, he had obstructive sleep apnea but stopped CPAP as he did not like the mask. However, he has lost > 150 pounds since the stroke.   He still snores but wife has not noted OSA.      He has excessive daytime sleepiness.  Methylphenidate 20 mg never really helped.     In the past, EDS was better when he wore CPAP but he can't wear he  whole night.      Insomnia:   He has sleep maintenance insomnia > sleep onet (usually quick).    He takes melatonin at bedtime.   Cognitive:   He and his wife note some decreased attention and decrease executive function abilities.  He had a neglect after the stroke which appears to improve.  Mood:    He continues to feel depressed. He thinks this certainly may have helped slightly but not much. He gets irritable very easily. He denies anxiety.  REVIEW OF SYSTEMS:  Constitutional: No fevers, chills, sweats, or change in appetite.  He sleeps poorly.    He is very tired Eyes: No visual changes, double vision, eye pain Ear, nose and throat: No hearing loss, ear pain, nasal congestion, sore throat Cardiovascular: No chest pain,  palpitations Respiratory:  No shortness of breath at rest or with exertion.   No wheezes GastrointestinaI: No nausea, vomiting, diarrhea, abdominal pain, fecal incontinence Genitourinary:  No dysuria, urinary retention or frequency.  No nocturia. Musculoskeletal:  No neck pain, back pain but he has shoulder and hip pain Integumentary: No rash, pruritus, skin lesions Neurological: as above Psychiatric: No depression at this time.  No anxiety Endocrine: No palpitations, diaphoresis, change in appetite, change in weigh.  He notes feeling cold and increased thirst Hematologic/Lymphatic:  No anemia, purpura, petechiae. Allergic/Immunologic: No itchy/runny eyes, nasal congestion, recent allergic reactions, rashes  ALLERGIES: Allergies  Allergen Reactions  . Diphenhydramine Hcl Anaphylaxis  . Adhesive [Tape] Other (See Comments)    Tears skin - please use paper tape    HOME MEDICATIONS: Outpatient Medications Prior to Visit  Medication Sig Dispense Refill  . acetaminophen (TYLENOL) 325 MG tablet Take 1 tablet (325 mg total) by mouth every 6 (six) hours as needed for mild pain.    Marland Kitchen apixaban (ELIQUIS) 5 MG TABS tablet Take 1 tablet (5 mg total) by mouth 2 (two) times daily. 180 tablet 0  . aspirin 81 MG EC tablet Take 1 tablet by mouth daily 30 tablet 12  . ferrous sulfate 325 (65 FE) MG EC tablet Take 1 tablet (325 mg total) by mouth daily with breakfast. 90 tablet 3  . fludrocortisone (FLORINEF) 0.1 MG tablet TAKE 1 TABLET BY MOUTH 2 TIMES DAILY. 60 tablet 3  . HYDROcodone-acetaminophen (NORCO/VICODIN) 5-325 MG tablet Take 1 tablet by mouth every 6 (six) hours as needed for moderate pain.    . Melatonin 10 MG CAPS Take 10 mg by mouth at bedtime as needed (sleep). 90 capsule 3  . midodrine (PROAMATINE) 2.5 MG tablet TAKE 1 TABLET BY MOUTH 3 TIMES DAILY WITH MEALS. 90 tablet 3  . nitroGLYCERIN (NITROSTAT) 0.4 MG SL tablet Place 1 tablet (0.4 mg total) under the tongue every 5 (five) minutes as  needed. For chest pain (Patient taking differently: Place 0.4 mg under the tongue every 5 (five) minutes as needed for chest pain. ) 25 tablet 12  . pantoprazole (PROTONIX) 40 MG tablet TAKE 1 TABLET BY MOUTH 2 TIMES DAILY. 60 tablet 5  . potassium chloride (K-DUR) 10 MEQ tablet TAKE 2 TABLETS BY MOUTH 3 TIMES DAILY. 180 tablet 5  . PRESCRIPTION MEDICATION Inhale into the lungs at bedtime. CPAP    . prochlorperazine (COMPAZINE) 10 MG tablet Take 1 tablet (10 mg total) by mouth every 8 (eight) hours as needed (headache, nausea or vomiting). 10 tablet 0  . rosuvastatin (CRESTOR) 40 MG tablet Take 1 tablet (40 mg total) by mouth daily. 30 tablet 4  .  senna (SENOKOT) 8.6 MG TABS tablet Take 1 tablet by mouth 2 (two) times daily.    Marland Kitchen senna-docusate (SENNA S) 8.6-50 MG tablet Take 1 tablet by mouth 2 (two) times daily. 60 tablet 5  . sertraline (ZOLOFT) 100 MG tablet Take 1 tablet (100 mg total) by mouth daily. 30 tablet 11  . tiZANidine (ZANAFLEX) 4 MG capsule Take 4 mg by mouth 3 (three) times daily.    . tamsulosin (FLOMAX) 0.4 MG CAPS capsule Take 1 capsule (0.4 mg total) by mouth daily. (Patient not taking: Reported on 09/16/2016) 90 capsule 3   No facility-administered medications prior to visit.     PAST MEDICAL HISTORY: Past Medical History:  Diagnosis Date  . Adrenal insufficiency ([place])   . Anxiety   . Basal cell carcinoma of skin of left ankle   . Bleeding stomach ulcer 2015  . Coronary artery disease    stent, then CABG 07/20/10  . Daily headache    "for the past month" (11/11/2014)  . Depression   . Diverticulosis   . GERD (gastroesophageal reflux disease)   . Gout   . H/O cardiomyopathy    ischemic, now last echo 07/30/11, EF 38%W  . History of hiatal hernia   . Hyperlipidemia   . Hypertension   . Internal hemorrhoids   . Left hemiplegia ([place])   . NSTEMI (non-ST elevated myocardial infarction) ([place])    NSTEMI- last cath 06/2011-Stent to LCX-DES, last nuc 07/30/11 low risk  .  Obesity (BMI 30-39.9)   . OSA on CPAP    since July 2013- uses CPAP sometimes , states he has lost 147 lbs. since stroke & doesn't use the CPAP as much as he use to.   . Plantar fasciitis of left foot   . Pneumonia    hosp. 12/2014  . Pulmonary embolism ([place]) 03/2014  . Stroke ([place]) 01/2014   "no control of LUE, can slightly LLE; memory problems since" (11/11/2014)  . Tubular adenoma of colon     PAST SURGICAL HISTORY: Past Surgical History:  Procedure Laterality Date  . BASAL CELL CARCINOMA EXCISION Left 2015   ankle  . CARDIAC SURGERY    . COLONOSCOPY N/A 02/10/2013   Procedure: COLONOSCOPY;  Surgeon: Ladene Artist, MD;  Location: [facility];  Service: Endoscopy;  Laterality: N/A;  . CORONARY ANGIOPLASTY WITH STENT PLACEMENT  07/01/2011   DES-Resolute to native LCX  . CORONARY ANGIOPLASTY WITH STENT PLACEMENT  12/01/2007   COMPLEX 5 LESION PCI INCLUDING CUTTING BALLOON AND 4 CYPHER DESs  . CORONARY ARTERY BYPASS GRAFT  07/20/2010    LIMA-LAD; VG-ACUTE MARG of RCA; Seq VG-distal RCA & then pda  . EP IMPLANTABLE DEVICE N/A 02/14/2016   Procedure: Loop Recorder Removal;  Surgeon: Thompson Grayer, MD;  Location: [facility];  Service: Cardiovascular;  Laterality: N/A;  . ESOPHAGOGASTRODUODENOSCOPY  01/2014   gastric ulcer, erosive gastroduodenitis, H Pylori negative.   . ESOPHAGOGASTRODUODENOSCOPY (EGD) WITH PROPOFOL N/A 11/14/2014   Procedure: ESOPHAGOGASTRODUODENOSCOPY (EGD) WITH PROPOFOL;  Surgeon: Milus Banister, MD;  Location: [facility];  Service: Endoscopy;  Laterality: N/A;  . HAND SURGERY Right    to take glass out  . HERNIA REPAIR    . INTRAMEDULLARY (IM) NAIL INTERTROCHANTERIC Right 02/07/2015   Procedure: INTRAMEDULLARY (IM) NAIL INTERTROCHANTRIC RIGHT HIP;  Surgeon: Renette Butters, MD;  Location: [facility];  Service: Orthopedics;  Laterality: Right;  . KNEE ARTHROSCOPY Right   . LEFT HEART CATHETERIZATION WITH CORONARY/GRAFT ANGIOGRAM  07/01/2011  Procedure: LEFT  HEART CATHETERIZATION WITH Beatrix Fetters;  Surgeon: Sanda Klein, MD;  Location: [facility] CATH LAB;  Service: Cardiovascular;;  . LOOP RECORDER IMPLANT  02/15/2014   MDT LINQ implanted by Dr Rayann Heman for cryptogenic stroke  . PERCUTANEOUS CORONARY STENT INTERVENTION (PCI-S) Right 07/01/2011   Procedure: PERCUTANEOUS CORONARY STENT INTERVENTION (PCI-S);  Surgeon: Sanda Klein, MD;  Location: [facility] CATH LAB;  Service: Cardiovascular;  Laterality: Right;  . PICC LINE PLACE PERIPHERAL ([place] HX)  06/2015  . RETINAL DETACHMENT SURGERY Right   . TEE WITHOUT CARDIOVERSION N/A 02/15/2014   Procedure: TRANSESOPHAGEAL ECHOCARDIOGRAM (TEE);  Surgeon: Josue Hector, MD;  Location: [facility];  Service: Cardiovascular;  Laterality: N/A;  . UMBILICAL HERNIA REPAIR  2006    FAMILY HISTORY: Family History  Problem Relation Age of Onset  . Diabetes type II Mother   . CAD Father 72       CABG, PCI, died at 27  . Heart attack Father 66  . Hypertension Brother   . Diabetes Brother   . Hyperlipidemia Brother   . Brain cancer Maternal Grandmother   . COPD Paternal Grandfather   . Coronary artery disease Unknown   . Other Unknown        denies family history of DVT/PE  . Colon cancer Neg Hx   . Stroke Neg Hx     SOCIAL HISTORY:  Social History   Social History  . Marital status: Married    Spouse name: N/A  . Number of children: 5  . Years of education: N/A   Occupational History  .  [employer]    works at the [employer]  . Smoking status: Former Smoker    Packs/day: 2.00    Years: 4.00    Types: Cigarettes    Quit date: 01/30/1969  . Smokeless tobacco: Never Used     Comment: "quit smoking cigarettes  in the 1970's"  . Alcohol use No  . Drug use: Yes    Types: Marijuana     Comment: "smoked some pot in college"  . Sexual activity: Not Currently   Other Topics Concern  . Not on file   Social History Narrative   Quit smoking 40 years ago    Married    Likes to play guitar and sing with church group unable since 02/12/14 stroke    [employer] 1970-1972, [place], no service related issues.       PHYSICAL EXAM  Vitals:   09/16/16 1057  BP: 103/71  Pulse: 85  Resp: 18  Weight: 188 lb (85.3 kg)  Height: 6' (1.829 m)    Body mass index is 25.5 kg/m.   General: The patient is well-developed and well-nourished and in no acute distress.  He is in a wheelchair.  Neck:  There are no carotid bruits. The neck is mildly tender in the lower cervical paraspinal region and the trapezius muscles..     Skin: He has mild edema at the left ankle.  Neurologic Exam  Mental status: The patient is alert and oriented x 3 at the time of the examination. The patient has apparent normal recent and remote memory. Attention is mildly reduced.   Speech is normal.  Cranial nerves: Extraocular movements are full. Saccadic pursuit is reduced to the left. PERRLA.  Facial strength is fairly symmetric. There is good facial sensation to soft touch bilaterally.  Trapezius and sternocleidomastoid strength is reduced on the left.. No dysarthria is noted.  No obvious  hearing deficits are noted.  Motor:  Muscle bulk is normal. He has increased muscle tone in the left arm and leg. The arm is held in a flexed position and he has severe spasticity proximal muscles. He also has spasticity in the leg muscles, especially the hamstrings.  Strength is 5/5 on the right. Strength is 2/5 in left shoulder elevation and 1/5 in proximal arm strength. Strength is 3/5 in the quadriceps and 2/5 in other proximal muscles of the left leg  Sensory: Sensory testing is intact to touch and vibration duration sensation    Coordination: Cerebellar testing reveals good finger-nose-finger and heel-to-shin on the right and he cannot do these tasks on the left..  Gait and station: Station requires unilat support. He needs strong support to walk    Reflexes: Deep tendon reflexes are  increased on the left.Marland Kitchen      DIAGNOSTIC DATA (LABS, IMAGING, TESTING) - I reviewed patient records, labs, notes, testing and imaging myself where available.  Lab Results  Component Value Date   WBC 3.6 (L) 08/30/2016   HGB 9.7 (L) 08/30/2016   HCT 30.4 (L) 08/30/2016   MCV 79.0 08/30/2016   PLT 136.0 (L) 08/30/2016      Component Value Date/Time   NA 141 08/30/2016 1508   K 3.7 08/30/2016 1508   CL 107 08/30/2016 1508   CO2 27 08/30/2016 1508   GLUCOSE 95 08/30/2016 1508   BUN 18 08/30/2016 1508   CREATININE 0.94 08/30/2016 1508   CREATININE 0.99 08/11/2015 1528   CALCIUM 9.1 08/30/2016 1508   PROT 6.7 08/30/2016 1508   ALBUMIN 3.8 08/30/2016 1508   AST 16 08/30/2016 1508   ALT 9 08/30/2016 1508   ALKPHOS 39 08/30/2016 1508   BILITOT 0.5 08/30/2016 1508   GFRNONAA >60 03/19/2016 0751   GFRNONAA >89 09/13/2014 1032   GFRAA >60 03/19/2016 0751   GFRAA >89 09/13/2014 1032   Lab Results  Component Value Date   CHOL 113 08/30/2016   HDL 48.10 08/30/2016   LDLCALC 57 08/30/2016   TRIG 40.0 08/30/2016   CHOLHDL 2 08/30/2016   Lab Results  Component Value Date   HGBA1C 5.6 02/13/2014   Lab Results  Component Value Date   VITAMINB12 286 07/05/2015   Lab Results  Component Value Date   TSH 1.035 12/23/2014        ASSESSMENT AND PLAN  Hemiplegia affecting left side in left-dominant patient as late effect of cerebrovascular disease ([place])  Chronic anticoagulation  Depression, unspecified depression type  Neck pain  OSA (obstructive sleep apnea)    1.    He will continue anticoagulation for his stroke. 2.    If neck pain worsens we can do another trigger point injection.   3.   We discuss making sure at he walks around the house safely and not go further than he feels safe doing.     4.   He is advised to use his CPAP more. I will prescribe Nuvigil to see if that helps his daytime sleepiness.. 5.    Continue Zoloft for mood. Add low dose doxepin for  insomnia.. 6.   He will return to see me in 6 months or sooner if he has new or worsening neurologic symptoms.   Richard A. Felecia Shelling, MD, PhD 05/23/486, 8:91 PM Certified in Neurology, Clinical Neurophysiology, Sleep Medicine, Pain Medicine and Neuroimaging  [facility] [address] [place], [place] [address] [phone]

## 2016-09-16 NOTE — Telephone Encounter (Signed)
Wife advised. 

## 2016-09-17 ENCOUNTER — Telehealth: Payer: Self-pay | Admitting: *Deleted

## 2016-09-17 ENCOUNTER — Other Ambulatory Visit (INDEPENDENT_AMBULATORY_CARE_PROVIDER_SITE_OTHER): Payer: Medicare Other

## 2016-09-17 DIAGNOSIS — D509 Iron deficiency anemia, unspecified: Secondary | ICD-10-CM | POA: Diagnosis not present

## 2016-09-17 LAB — FECAL OCCULT BLOOD, IMMUNOCHEMICAL: Fecal Occult Bld: NEGATIVE

## 2016-09-17 NOTE — Telephone Encounter (Signed)
PA for Armodafinil 50mg  #120/30 completed via Cover My Meds.  Dx.: G47.33 (OSA).  Approved for dates 08/18/16 thru 09/17/17.  Case ID: [id number]

## 2016-09-25 ENCOUNTER — Other Ambulatory Visit: Payer: Self-pay | Admitting: Cardiovascular Disease

## 2016-09-26 NOTE — Telephone Encounter (Signed)
Rx(s) sent to pharmacy electronically.  

## 2016-10-04 ENCOUNTER — Other Ambulatory Visit (INDEPENDENT_AMBULATORY_CARE_PROVIDER_SITE_OTHER): Payer: Medicare Other

## 2016-10-04 ENCOUNTER — Ambulatory Visit: Payer: Medicare Other | Admitting: Family Medicine

## 2016-10-04 DIAGNOSIS — D509 Iron deficiency anemia, unspecified: Secondary | ICD-10-CM

## 2016-10-04 LAB — IRON AND TIBC
%SAT: 13 % — ABNORMAL LOW (ref 15–60)
Iron: 36 ug/dL — ABNORMAL LOW (ref 50–180)
TIBC: 282 ug/dL (ref 250–425)
UIBC: 246 ug/dL

## 2016-10-04 LAB — CBC WITH DIFFERENTIAL/PLATELET
Basophils Absolute: 40 cells/uL (ref 0–200)
Basophils Relative: 1 %
Eosinophils Absolute: 360 cells/uL (ref 15–500)
Eosinophils Relative: 9 %
HCT: 36.6 % — ABNORMAL LOW (ref 38.5–50.0)
Hemoglobin: 11.2 g/dL — ABNORMAL LOW (ref 13.2–17.1)
Lymphocytes Relative: 27 %
Lymphs Abs: 1080 cells/uL (ref 850–3900)
MCH: 25.5 pg — ABNORMAL LOW (ref 27.0–33.0)
MCHC: 30.6 g/dL — ABNORMAL LOW (ref 32.0–36.0)
MCV: 83.2 fL (ref 80.0–100.0)
MPV: 10.4 fL (ref 7.5–12.5)
Monocytes Absolute: 240 cells/uL (ref 200–950)
Monocytes Relative: 6 %
Neutro Abs: 2280 cells/uL (ref 1500–7800)
Neutrophils Relative %: 57 %
Platelets: 130 10*3/uL — ABNORMAL LOW (ref 140–400)
RBC: 4.4 MIL/uL (ref 4.20–5.80)
RDW: 18 % — ABNORMAL HIGH (ref 11.0–15.0)
WBC: 4 10*3/uL (ref 3.8–10.8)

## 2016-10-04 NOTE — Addendum Note (Signed)
Addended by: Marchia Bond on: 10/04/2016 04:09 PM   Modules accepted: Orders

## 2016-10-07 LAB — TRANSFERRIN: Transferrin: 230 mg/dL (ref 188–341)

## 2016-10-08 ENCOUNTER — Other Ambulatory Visit: Payer: Self-pay | Admitting: Family Medicine

## 2016-10-08 DIAGNOSIS — D649 Anemia, unspecified: Secondary | ICD-10-CM

## 2016-10-10 ENCOUNTER — Other Ambulatory Visit: Payer: Self-pay | Admitting: Cardiovascular Disease

## 2016-11-04 ENCOUNTER — Telehealth: Payer: Self-pay | Admitting: Family Medicine

## 2016-11-04 NOTE — Telephone Encounter (Signed)
Left pt message asking to call Ebony Hail back directly at [phone] to schedule AWV + labs with Katha Cabal and CPE with PCP.  *NOTE* Last AWV 08/11/15

## 2016-11-26 ENCOUNTER — Ambulatory Visit: Payer: Medicare Other

## 2016-11-26 ENCOUNTER — Other Ambulatory Visit (INDEPENDENT_AMBULATORY_CARE_PROVIDER_SITE_OTHER): Payer: Medicare Other

## 2016-11-26 ENCOUNTER — Encounter: Payer: Self-pay | Admitting: Family Medicine

## 2016-11-26 ENCOUNTER — Ambulatory Visit (INDEPENDENT_AMBULATORY_CARE_PROVIDER_SITE_OTHER): Payer: Medicare Other | Admitting: Family Medicine

## 2016-11-26 VITALS — BP 102/68 | HR 87 | Temp 97.4°F | Wt 191.5 lb

## 2016-11-26 DIAGNOSIS — Z7189 Other specified counseling: Secondary | ICD-10-CM

## 2016-11-26 DIAGNOSIS — I69352 Hemiplegia and hemiparesis following cerebral infarction affecting left dominant side: Secondary | ICD-10-CM | POA: Diagnosis not present

## 2016-11-26 DIAGNOSIS — I959 Hypotension, unspecified: Secondary | ICD-10-CM | POA: Diagnosis not present

## 2016-11-26 DIAGNOSIS — F32A Depression, unspecified: Secondary | ICD-10-CM

## 2016-11-26 DIAGNOSIS — F329 Major depressive disorder, single episode, unspecified: Secondary | ICD-10-CM | POA: Diagnosis not present

## 2016-11-26 DIAGNOSIS — D649 Anemia, unspecified: Secondary | ICD-10-CM

## 2016-11-26 DIAGNOSIS — Z0000 Encounter for general adult medical examination without abnormal findings: Secondary | ICD-10-CM

## 2016-11-26 DIAGNOSIS — R229 Localized swelling, mass and lump, unspecified: Secondary | ICD-10-CM

## 2016-11-26 DIAGNOSIS — Z23 Encounter for immunization: Secondary | ICD-10-CM

## 2016-11-26 DIAGNOSIS — Z79899 Other long term (current) drug therapy: Secondary | ICD-10-CM

## 2016-11-26 LAB — CBC WITH DIFFERENTIAL/PLATELET
Basophils Absolute: 0.1 10*3/uL (ref 0.0–0.1)
Basophils Relative: 1.3 % (ref 0.0–3.0)
Eosinophils Absolute: 0.4 10*3/uL (ref 0.0–0.7)
Eosinophils Relative: 8.3 % — ABNORMAL HIGH (ref 0.0–5.0)
HCT: 37.5 % — ABNORMAL LOW (ref 39.0–52.0)
Hemoglobin: 12 g/dL — ABNORMAL LOW (ref 13.0–17.0)
Lymphocytes Relative: 21.3 % (ref 12.0–46.0)
Lymphs Abs: 1 10*3/uL (ref 0.7–4.0)
MCHC: 31.9 g/dL (ref 30.0–36.0)
MCV: 85 fl (ref 78.0–100.0)
Monocytes Absolute: 0.3 10*3/uL (ref 0.1–1.0)
Monocytes Relative: 7.2 % (ref 3.0–12.0)
Neutro Abs: 2.9 10*3/uL (ref 1.4–7.7)
Neutrophils Relative %: 61.9 % (ref 43.0–77.0)
Platelets: 144 10*3/uL — ABNORMAL LOW (ref 150.0–400.0)
RBC: 4.41 Mil/uL (ref 4.22–5.81)
RDW: 18.5 % — ABNORMAL HIGH (ref 11.5–15.5)
WBC: 4.7 10*3/uL (ref 4.0–10.5)

## 2016-11-26 LAB — HEPATIC FUNCTION PANEL
ALT: 11 U/L (ref 0–53)
AST: 20 U/L (ref 0–37)
Albumin: 3.6 g/dL (ref 3.5–5.2)
Alkaline Phosphatase: 49 U/L (ref 39–117)
Bilirubin, Direct: 0.1 mg/dL (ref 0.0–0.3)
Total Bilirubin: 0.3 mg/dL (ref 0.2–1.2)
Total Protein: 6.5 g/dL (ref 6.0–8.3)

## 2016-11-26 LAB — IRON: Iron: 50 ug/dL (ref 42–165)

## 2016-11-26 MED ORDER — MIDODRINE HCL 2.5 MG PO TABS: ORAL_TABLET | ORAL | Status: DC

## 2016-11-26 MED ORDER — POTASSIUM CHLORIDE ER 10 MEQ PO TBCR: 20.0000 meq | EXTENDED_RELEASE_TABLET | Freq: Two times a day (BID) | ORAL | Status: DC

## 2016-11-26 NOTE — Patient Instructions (Addendum)
Check with your insurance to see if they will cover the shingrix shot. Let me get your labs and we'll go from there.  Take care.  Glad to see you.

## 2016-11-26 NOTE — Progress Notes (Signed)
I have personally reviewed the Medicare Annual Wellness questionnaire and have noted 1. The patient's medical and social history 2. Their use of alcohol, tobacco or illicit drugs 3. Their current medications and supplements 4. The patient's functional ability including ADL's, fall risks, home safety risks and hearing or visual             impairment. 5. Diet and physical activities 6. Evidence for depression or mood disorders  The patients weight, height, BMI have been recorded in the chart and visual acuity is per eye clinic.  I have made referrals, counseling and provided education to the patient based review of the above and I have provided the pt with a written personalized care plan for preventive services.  Provider list updated- see scanned forms.  Routine anticipatory guidance given to patient.  See health maintenance. The possibility exists that previously documented standard health maintenance information may have been brought forward from a previous encounter into this note.  If needed, that same information has been updated to reflect the current situation based on today's encounter.    Flu 2018 Shingles d/w pt.  PNA 2016 Tetanus d/w pt.  Colonoscopy 2014 Prostate cancer screening- deferred for now.  Even if he had abnormal PSA, it wouldn't be reasonable to hold anticoagulation for consideration of biopsy.  Given his other issues, this was discussed and deferred.   Advance directive- wife designated if patient were incapacitated.   Cognitive function addressed- see scanned forms- and if abnormal then additional documentation follows.   Anemia.  Repeat labs pending.  No bleeding.  We can make a decision about continued iron after seeing his labs.  He agrees.    Lump on R buttock for about 3 weeks.  No specific trauma.  Not ttp now.  No bruising.  No drainage.    Mood. He has a difficult situation that he is trying to accept.  SSRI helped his mood.  He can tolerate his situation  as is.    Lower BP.  Has been on midodrine and fludrocortisone chronically.  Unclear if he will be able to taper.  We agreed to check labs prior to change in meds.  It didn't make sense to try taper when he was already anemia.  See above.  He agrees.    PMH and SH reviewed  Meds, vitals, and allergies reviewed.   ROS: Per HPI.  Unless specifically indicated otherwise in HPI, the patient denies:  General: fever. Eyes: acute vision changes ENT: sore throat Cardiovascular: chest pain Respiratory: SOB GI: vomiting GU: dysuria Musculoskeletal: acute back pain Derm: acute rash Neuro: acute motor dysfunction Psych: worsening mood Endocrine: polydipsia Heme: bleeding Allergy: hayfever  GEN: nad, alert and oriented HEENT: mucous membranes moist NECK: supple w/o LA CV: rrr. PULM: ctab, no inc wob ABD: soft, +bs EXT: no edema SKIN: no acute rash L side flaccid at baseline Soft tissue lump/swelling noted on the L buttock w/o bruising or tenderness.  No fluctuance.

## 2016-11-28 ENCOUNTER — Other Ambulatory Visit: Payer: Self-pay | Admitting: Family Medicine

## 2016-11-28 DIAGNOSIS — Z006 Encounter for examination for normal comparison and control in clinical research program: Secondary | ICD-10-CM | POA: Insufficient documentation

## 2016-11-28 DIAGNOSIS — R229 Localized swelling, mass and lump, unspecified: Secondary | ICD-10-CM | POA: Insufficient documentation

## 2016-11-28 DIAGNOSIS — Z862 Personal history of diseases of the blood and blood-forming organs and certain disorders involving the immune mechanism: Secondary | ICD-10-CM

## 2016-11-28 DIAGNOSIS — Z7189 Other specified counseling: Secondary | ICD-10-CM | POA: Insufficient documentation

## 2016-11-28 MED ORDER — MIDODRINE HCL 2.5 MG PO TABS: ORAL_TABLET | ORAL | 3 refills | Status: DC

## 2016-11-28 MED ORDER — POTASSIUM CHLORIDE ER 10 MEQ PO TBCR: 20.0000 meq | EXTENDED_RELEASE_TABLET | Freq: Two times a day (BID) | ORAL | 3 refills | Status: DC

## 2016-11-28 MED ORDER — MELATONIN 10 MG PO CAPS: 10.0000 mg | ORAL_CAPSULE | Freq: Every evening | ORAL | 3 refills | Status: DC | PRN

## 2016-11-28 MED ORDER — FLUDROCORTISONE ACETATE 0.1 MG PO TABS: 100.0000 ug | ORAL_TABLET | Freq: Two times a day (BID) | ORAL | 3 refills | Status: DC

## 2016-11-28 MED ORDER — SENNOSIDES-DOCUSATE SODIUM 8.6-50 MG PO TABS: 1.0000 | ORAL_TABLET | Freq: Two times a day (BID) | ORAL | 99 refills | Status: DC

## 2016-11-28 NOTE — Assessment & Plan Note (Signed)
He has a difficult situation that he is trying to accept.  SSRI helped his mood.  He can tolerate his situation as is.   Wife is supportive.  Continue SSRI as is.  He'll update me if worse.  He agrees.

## 2016-11-28 NOTE — Assessment & Plan Note (Signed)
Flu 2018 Shingles d/w pt.  PNA 2016 Tetanus d/w pt.  Colonoscopy 2014 Prostate cancer screening- deferred for now.  Even if he had abnormal PSA, it wouldn't be reasonable to hold anticoagulation for consideration of biopsy.  Given his other issues, this was discussed and deferred.   Advance directive- wife designated if patient were incapacitated.   Cognitive function addressed- see scanned forms- and if abnormal then additional documentation follows.

## 2016-11-28 NOTE — Assessment & Plan Note (Signed)
The lump he noted is likely local bruising in the deep tissue.  It doesn't feel like an abscess or cyst or lipoma.  Would observe.  He agrees.

## 2016-11-28 NOTE — Assessment & Plan Note (Signed)
Advance directive- wife designated if patient were incapacitated.  

## 2016-11-28 NOTE — Assessment & Plan Note (Addendum)
Repeat labs pending.  No bleeding.  We can make a decision about continued iron after seeing his labs.  He agrees.    Would still continue anticoagulation at this point.

## 2016-11-28 NOTE — Assessment & Plan Note (Signed)
Using wheelchair, continue accommodations as is.

## 2016-11-28 NOTE — Assessment & Plan Note (Signed)
Has been on midodrine and fludrocortisone chronically.  Unclear if he will be able to taper.  We agreed to check labs prior to change in meds.  It didn't make sense to try taper when he was already anemia.  See above.  He agrees.

## 2016-12-04 ENCOUNTER — Encounter: Payer: Self-pay | Admitting: Cardiovascular Disease

## 2016-12-04 ENCOUNTER — Ambulatory Visit (INDEPENDENT_AMBULATORY_CARE_PROVIDER_SITE_OTHER): Payer: Medicare Other | Admitting: Cardiovascular Disease

## 2016-12-04 VITALS — BP 124/72 | HR 75 | Ht 72.0 in | Wt 193.0 lb

## 2016-12-04 DIAGNOSIS — I25810 Atherosclerosis of coronary artery bypass graft(s) without angina pectoris: Secondary | ICD-10-CM

## 2016-12-04 DIAGNOSIS — I5042 Chronic combined systolic (congestive) and diastolic (congestive) heart failure: Secondary | ICD-10-CM | POA: Diagnosis not present

## 2016-12-04 DIAGNOSIS — G4733 Obstructive sleep apnea (adult) (pediatric): Secondary | ICD-10-CM | POA: Diagnosis not present

## 2016-12-04 DIAGNOSIS — E785 Hyperlipidemia, unspecified: Secondary | ICD-10-CM | POA: Diagnosis not present

## 2016-12-04 DIAGNOSIS — I480 Paroxysmal atrial fibrillation: Secondary | ICD-10-CM

## 2016-12-04 DIAGNOSIS — Z7901 Long term (current) use of anticoagulants: Secondary | ICD-10-CM

## 2016-12-04 MED ORDER — ASPIRIN 81 MG PO TBEC: 81.0000 mg | DELAYED_RELEASE_TABLET | Freq: Every day | ORAL | 3 refills | Status: DC

## 2016-12-04 MED ORDER — APIXABAN 5 MG PO TABS
5.0000 mg | ORAL_TABLET | Freq: Two times a day (BID) | ORAL | 3 refills | Status: DC
Start: 2016-12-04 — End: 2017-12-10

## 2016-12-04 MED ORDER — ROSUVASTATIN CALCIUM 40 MG PO TABS: 40.0000 mg | ORAL_TABLET | Freq: Every day | ORAL | 3 refills | Status: DC

## 2016-12-04 NOTE — Progress Notes (Signed)
Patient ID: Gregory Wiley, male   DOB: Nov 21, 1950, 66 y.o.   MRN: [id number]    Primary MD: Dr. [name]  HPI: Gregory Wiley is a 66 y.o. male presents to the office today for 12 month follow-up cardiology evaluation. He is a former patient of Dr. Rex Kras.  [name] has established CAD and underwent stenting of his RCA in May 2011. He is s/p CABG surgery with LIMA to the LAD, sequential vein to the distal RCA and PDA, and vein to the acute marginal branch of his right coronary artery. In April 2013 he developed unstable angina and ruled in for non-ST segment elevation myocardial infarction. Catheterization revealed 99% stenosis of the large left circumflex vessel and he underwent successful stenting with insertion of a 4.0x18 mm resolute DES stent post dilated to 4.5 mm.  Additional problems include obstructive sleep apnea. On his initial polysomnogram in 2010 AHI was 11.3 overall, but during REM sleep he had moderate sleep apnea with an AHI of 24.8. He never did initiate CPAP therapy at that time. However, since his myocardial infarction he has been using CPAP therapy since July 2013.  When I last saw him, he was only utilizing CPAP intermittently.  He now admits to much more frequent use but there are times when he does not use treatment.  He has a history of hyperlipidemia, hypertension, and morbid obesity. An echo Doppler study in May 2013 suggested an ejection fraction of 38% and on nuclear imaging  was 37% with scar in the LAD territory.  He was admitted to [facility] hospital in November 2015 with a large right MCA territory CVA.  TEE was negative for embolic source.  EF was 45% on echocardiogram.  He underwent implantable loop recorder by Dr. Rayann Heman. He was readmitted with bilateral submassive pulmonary embolism.  Venous Dopplers were positive for DVT.  Ejection fraction was 25-30%, which is felt to be contributed by strain from his pulmonary emboli.  He had mild troponin elevation which was felt  to be due to demand ischemia and has been on Xarelto for anticoagulation.  He was admitted on the [facility] hospitalist service on 11/11/2014 with dehydration.  Several of his medications were held.  I reviewed his hospital record.  He had a history of nausea and vomiting that had been going on for approximatey a month.  He has continued to feel weak and in his words, "feels terrible" since his hospitalization.  He has had issues with headaches which he did see neurology.  He has not been eating well.  He seeing Dr. Damita Dunnings at [facility] for primary care.  He denies chest pressure.  He denies fevers.  He is on Xarelto anticoagulation and denies overt bleeding.  There is a remote history of erosive gastroduodenitis, GERD, esophageal stricture for which in the past he had been seen by Dr. Fuller Plan.  When I previously saw him, he was tachycardic which I felt was contributed by his Ritalin.  He has since been off this medication.   Inside last saw him one year ago, he has been without chest pain, palpitations, shortness of breath, PND, orthopnea.  He November 2017, he had removal of his loop recorder by Dr. Rayann Heman.  This had been initially placed at that in 2015 following his large right middle cerebral artery stroke.  Over the last several years he admits to a purposeful 170 pound weight loss with a peak weight at 346 down to 193.  His initial sleep study was  in September 2010.  He admits to fatigability.  In the office today an Epworth Sleepiness Scale score was calculated and this endorsed at 15 which is consistent with excessive daytime sleepiness.  He also has noticed lower extremity edema.  He presents for evaluation.   Past Medical History:  Diagnosis Date  . Adrenal insufficiency ([place])   . Anxiety   . Basal cell carcinoma of skin of left ankle   . Bleeding stomach ulcer 2015  . Coronary artery disease    stent, then CABG 07/20/10  . Daily headache    "for the past month" (11/11/2014)  . Depression   .  Diverticulosis   . GERD (gastroesophageal reflux disease)   . Gout   . H/O cardiomyopathy    ischemic, now last echo 07/30/11, EF 38%W  . History of hiatal hernia   . Hyperlipidemia   . Hypertension   . Internal hemorrhoids   . Left hemiplegia ([place])   . NSTEMI (non-ST elevated myocardial infarction) ([place])    NSTEMI- last cath 06/2011-Stent to LCX-DES, last nuc 07/30/11 low risk  . Obesity (BMI 30-39.9)   . OSA on CPAP    since July 2013- uses CPAP sometimes , states he has lost 147 lbs. since stroke & doesn't use the CPAP as much as he use to.   . Plantar fasciitis of left foot   . Pneumonia    hosp. 12/2014  . Pulmonary embolism ([place]) 03/2014  . Stroke ([place]) 01/2014   "no control of LUE, can slightly LLE; memory problems since" (11/11/2014)  . Tubular adenoma of colon     Past Surgical History:  Procedure Laterality Date  . BASAL CELL CARCINOMA EXCISION Left 2015   ankle  . CARDIAC SURGERY    . CATARACT EXTRACTION Bilateral   . COLONOSCOPY N/A 02/10/2013   Procedure: COLONOSCOPY;  Surgeon: Ladene Artist, MD;  Location: [facility];  Service: Endoscopy;  Laterality: N/A;  . CORONARY ANGIOPLASTY WITH STENT PLACEMENT  07/01/2011   DES-Resolute to native LCX  . CORONARY ANGIOPLASTY WITH STENT PLACEMENT  12/01/2007   COMPLEX 5 LESION PCI INCLUDING CUTTING BALLOON AND 4 CYPHER DESs  . CORONARY ARTERY BYPASS GRAFT  07/20/2010    LIMA-LAD; VG-ACUTE MARG of RCA; Seq VG-distal RCA & then pda  . EP IMPLANTABLE DEVICE N/A 02/14/2016   Procedure: Loop Recorder Removal;  Surgeon: Thompson Grayer, MD;  Location: [facility];  Service: Cardiovascular;  Laterality: N/A;  . ESOPHAGOGASTRODUODENOSCOPY  01/2014   gastric ulcer, erosive gastroduodenitis, H Pylori negative.   . ESOPHAGOGASTRODUODENOSCOPY (EGD) WITH PROPOFOL N/A 11/14/2014   Procedure: ESOPHAGOGASTRODUODENOSCOPY (EGD) WITH PROPOFOL;  Surgeon: Milus Banister, MD;  Location: [facility];  Service: Endoscopy;  Laterality: N/A;  .  HAND SURGERY Right    to take glass out  . HERNIA REPAIR    . INTRAMEDULLARY (IM) NAIL INTERTROCHANTERIC Right 02/07/2015   Procedure: INTRAMEDULLARY (IM) NAIL INTERTROCHANTRIC RIGHT HIP;  Surgeon: Renette Butters, MD;  Location: [facility];  Service: Orthopedics;  Laterality: Right;  . KNEE ARTHROSCOPY Right   . LEFT HEART CATHETERIZATION WITH CORONARY/GRAFT ANGIOGRAM  07/01/2011   Procedure: LEFT HEART CATHETERIZATION WITH Beatrix Fetters;  Surgeon: Sanda Klein, MD;  Location: [facility] C[facility] LAB;  Service: Cardiovascular;;  . LOOP RECORDER IMPLANT  02/15/2014   MDT LINQ implanted by Dr Rayann Heman for cryptogenic stroke  . PERCUTANEOUS CORONARY STENT INTERVENTION (PCI-S) Right 07/01/2011   Procedure: PERCUTANEOUS CORONARY STENT INTERVENTION (PCI-S);  Surgeon: Sanda Klein, MD;  Location: [facility]  CATH LAB;  Service: Cardiovascular;  Laterality: Right;  . PICC LINE PLACE PERIPHERAL ([place] HX)  06/2015  . RETINAL DETACHMENT SURGERY Right   . TEE WITHOUT CARDIOVERSION N/A 02/15/2014   Procedure: TRANSESOPHAGEAL ECHOCARDIOGRAM (TEE);  Surgeon: Josue Hector, MD;  Location: [facility];  Service: Cardiovascular;  Laterality: N/A;  . UMBILICAL HERNIA REPAIR  2006    Allergies  Allergen Reactions  . Diphenhydramine Hcl Anaphylaxis  . Adhesive [Tape] Other (See Comments)    Tears skin - please use paper tape  . Proscar [Finasteride] Other (See Comments)    Lightheaded.   . Tamsulosin Other (See Comments)    Dizzy, vomiting.     Current Outpatient Prescriptions  Medication Sig Dispense Refill  . acetaminophen (TYLENOL) 325 MG tablet Take 1 tablet (325 mg total) by mouth every 6 (six) hours as needed for mild pain.    Marland Kitchen apixaban (ELIQUIS) 5 MG TABS tablet Take 1 tablet (5 mg total) by mouth 2 (two) times daily. 180 tablet 3  . aspirin 81 MG EC tablet Take 1 tablet (81 mg total) by mouth daily. Swallow whole. 90 tablet 3  . cholecalciferol (VITAMIN D) 1000 units tablet Take 1,000 Units by mouth daily.     Marland Kitchen doxepin (SINEQUAN) 10 MG capsule Take 1 capsule (10 mg total) by mouth at bedtime. 30 capsule 11  . fludrocortisone (FLORINEF) 0.1 MG tablet Take 1 tablet (100 mcg total) by mouth 2 (two) times daily. 60 tablet 3  . HYDROcodone-acetaminophen (NORCO/VICODIN) 5-325 MG tablet Take 1 tablet by mouth every 6 (six) hours as needed for moderate pain.    . Melatonin 10 MG CAPS Take 10 mg by mouth at bedtime as needed (sleep). 90 capsule 3  . midodrine (PROAMATINE) 2.5 MG tablet TAKE 1 TABLET BY MOUTH 2 TIMES DAILY WITH MEALS. 60 tablet 3  . nitroGLYCERIN (NITROSTAT) 0.4 MG SL tablet Place 1 tablet (0.4 mg total) under the tongue every 5 (five) minutes as needed. For chest pain (Patient taking differently: Place 0.4 mg under the tongue every 5 (five) minutes as needed for chest pain. ) 25 tablet 12  . NON FORMULARY Rejuvenix - Take 2 capsules eacg\h morning    . NON FORMULARY Magnical-O   Take 2 capsules in the evening    . pantoprazole (PROTONIX) 40 MG tablet TAKE 1 TABLET BY MOUTH 2 TIMES DAILY. 60 tablet 6  . potassium chloride (K-DUR) 10 MEQ tablet Take 2 tablets (20 mEq total) by mouth 2 (two) times daily. 60 tablet 3  . PRESCRIPTION MEDICATION Inhale into the lungs at bedtime. CPAP    . prochlorperazine (COMPAZINE) 10 MG tablet Take 1 tablet (10 mg total) by mouth every 8 (eight) hours as needed (headache, nausea or vomiting). 10 tablet 0  . rosuvastatin (CRESTOR) 40 MG tablet Take 1 tablet (40 mg total) by mouth daily. 90 tablet 3  . senna-docusate (SENNA S) 8.6-50 MG tablet Take 1 tablet by mouth 2 (two) times daily. 60 tablet prn  . sertraline (ZOLOFT) 100 MG tablet Take 1 tablet (100 mg total) by mouth daily. 30 tablet 11  . tiZANidine (ZANAFLEX) 4 MG capsule Take 4 mg by mouth as needed.      No current facility-administered medications for this visit.     Social History   Social History  . Marital status: Married    Spouse name: N/A  . Number of children: 5  . Years of education:  N/A   Occupational History  .  Reita Cliche  works at the [employer]  . Smoking status: Former Smoker    Packs/day: 2.00    Years: 4.00    Types: Cigarettes    Quit date: 01/30/1969  . Smokeless tobacco: Never Used     Comment: "quit smoking cigarettes  in the 1970's"  . Alcohol use No  . Drug use: No     Comment: "smoked some pot in college"  . Sexual activity: Not Currently   Other Topics Concern  . 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      Family History  Problem Relation Age of Onset  . Diabetes type II Mother   . CAD Father 30       CABG, PCI, died at 65  . Heart attack Father 78  . Hypertension Brother   . Diabetes Brother   . Hyperlipidemia Brother   . Brain cancer Maternal Grandmother   . COPD Paternal Grandfather   . Coronary artery disease Unknown   . Other Unknown        denies family history of DVT/PE  . Colon cancer Neg Hx   . Stroke Neg Hx   . Prostate cancer Neg Hx    Additional social history is notable in that he is married has 5 children 4 grandchildren. He does try to walk. There is no tobacco or alcohol use.  ROS General: Negative; No fevers, chills, or night sweats;  HEENT: Negative; No changes in vision or hearing, sinus congestion, difficulty swallowing Pulmonary: Negative; No cough, wheezing, shortness of breath, hemoptysis Cardiovascular: see HPI GI: History of recent nausea and vomiting. GU: Negative; No dysuria, hematuria, or difficulty voiding Musculoskeletal: Negative; no myalgias, joint pain, or weakness Hematologic/Oncology: Negative; no easy bruising, bleeding Endocrine: Negative; no heat/cold intolerance; no diabetes Neuro: Right MCA stroke with residual left-sided weakness Skin: Negative; No rashes or skin  lesions Psychiatric: Negative; No behavioral problems, depression Sleep: Positive for sleep apnea on CPAP;daytime sleepiness, hypersomnolence: no bruxism, restless legs, hypnogognic hallucinations, no cataplexy Other comprehensive 14 point system review is negative.  PE BP 124/72   Pulse 75   Ht 6' (1.829 m)   Wt 193 lb (87.5 kg)   BMI 26.18 kg/m    Repeat blood pressure by me was 104/68  Wt Readings from Last 3 Encounters:  12/04/16 193 lb (87.5 kg)  11/26/16 191 lb 8 oz (86.9 kg)  09/16/16 188 lb (85.3 kg)    General: Alert, oriented, no distress.  Skin: normal turgor, no rashes, warm and dry HEENT: Normocephalic, atraumatic. Pupils equal round and reactive to light; sclera anicteric; extraocular muscles intact; Nose without nasal septal hypertrophy Mouth/Parynx benign; Mallinpatti scale Neck: No JVD, no carotid bruits; normal carotid upstroke Lungs: clear to ausculatation and percussion; no wheezing or rales Chest wall: without tenderness to palpitation Heart: PMI not displaced, RRR, s1 s2 normal, 1/6 systolic murmur, no diastolic murmur, no rubs, gallops, thrills, or heaves Abdomen: soft, nontender; no hepatosplenomehaly, BS+; abdominal aorta nontender and not dilated by palpation. Back: no CVA tenderness Pulses 2+ Musculoskeletal: full range of motion, normal strength, no joint deformities Extremities: Bilateral lower extremity edema, left greater than right; no clubbing cyanosis , Homan's sign negative  Neurologic: Residual left hemiparesis Psychologic: Normal mood and affect  ECG (independently read by me): Normal sinus rhythm at 74 bpm.  QRS complex V1 through V3.  Previously noted lateral ST changes.  Normal intervals.  March 2017 ECG (independently read by me):  Normal sinus rhythm at 61 bpm. Poor R wave progression. Lateral ST segment changes.  QTc interval 469 ms  September 2016 ECG (independently read by me): Sinus tachycardia 1 18 bpm.  Left axis deviation.   Cannot rule out old anterolateral infarct.  ECG (independently read by me): Normal sinus rhythm at 93 bpm.  QS complex anteriorly compatible with old anterior MI  April 2016 ECG (independently read by me): Normal sinus rhythm at 79 bpm.  Old anteroseptal MI with QS complex V1 through V3.  LVH by voltage criteria in aVL.  Increased QTc interval 488 ms.  ECG: Normal sinus rhythm at 60. QS complex in V1 through V3 concordant with his documented LAD region scar. T-wave inversion in leads 1 and avL unchanged.   LABS:  BMP Latest Ref Rng & Units 08/30/2016 03/19/2016 11/09/2015  Glucose 70 - 99 mg/dL 95 134(H) 96  BUN 6 - 23 mg/dL _0 Creatinine 0.40 - 1.50 mg/dL 0.94 1.07 0.88  Sodium 135 - 145 mEq/L 141 135 139  Potassium 3.5 - 5.1 mEq/L 3.7 3.8 4.2  Chloride 96 - 112 mEq/L 107 103 105  CO2 19 - 32 mEq/L _1 Calcium 8.4 - 10.5 mg/dL 9.1 8.9 8.9    Hepatic Function Latest Ref Rng & Units 11/26/2016 08/30/2016 03/19/2016  Total Protein 6.0 - 8.3 g/dL 6.5 6.7 6.9  Albumin 3.5 - 5.2 g/dL 3.6 3.8 3.5  AST 0 - 37 U/L 20 16 41  ALT 0 - 53 U/L 11 9 37  Alk Phosphatase 39 - 117 U/L 49 39 46  Total Bilirubin 0.2 - 1.2 mg/dL 0.3 0.5 0.5  Bilirubin, Direct 0.0 - 0.3 mg/dL 0.1 - -    CBC Latest Ref Rng & Units 11/26/2016 10/04/2016 08/30/2016  WBC 4.0 - 10.5 K/uL 4.7 4.0 3.6(L)  Hemoglobin 13.0 - 17.0 g/dL 12.0(L) 11.2(L) 9.7(L)  Hematocrit 39.0 - 52.0 % 37.5(L) 36.6(L) 30.4(L)  Platelets 150.0 - 400.0 K/uL 144.0(L) 130(L) 136.0(L)   Lab Results  Component Value Date   MCV 85.0 11/26/2016   MCV 83.2 10/04/2016   MCV 79.0 08/30/2016   Lab Results  Component Value Date   TSH 1.035 12/23/2014   Lab Results  Component Value Date   HGBA1C 5.6 02/13/2014    BNP    Component Value Date/Time   PROBNP 1,224.0 (H) 06/30/2011 0202    Lipid Panel     Component Value Date/Time   CHOL 113 08/30/2016 1508   CHOL 199 01/04/2013 1206   TRIG 40.0 08/30/2016 1508   TRIG 113 01/04/2013  1206   HDL 48.10 08/30/2016 1508   HDL 47 01/04/2013 1206   CHOLHDL 2 08/30/2016 1508   VLDL 8.0 08/30/2016 1508   LDLCALC 57 08/30/2016 1508   LDLCALC 129 (H) 01/04/2013 1206     RADIOLOGY: No results found.  IMPRESSION:  1. Coronary artery disease involving coronary bypass graft of native heart without angina pectoris   2. OSA (obstructive sleep apnea)   3. Chronic combined systolic and diastolic congestive heart failure ([place])   4. PAF (paroxysmal atrial fibrillation) ([place])   5. Chronic anticoagulation   6. Hyperlipidemia with target LDL less than 70     ASSESSMENT AND PLAN: Mr. Mallek is a 66 year old gentleman with a  history of morbid obesity, hypertension, known coronary artery disease who is status post stenting of his RCA in May 2011 with subsequent CABG surgery. In April 2013 he was found to have subtotal stenosis in a very large circumflex vessel and underwent successful insertion of a large Resolute DES stent. A nuclear perfusion study in May 2013  revealed an ejection fraction approximately 37%.   He has suffered a large right MCA CVA and had an implantable loop recorder to assess for potential atrial fibrillation.  He subsequently suffered bilateral submassive pulmonary emboli and has developed a cardiomyopathy with a drop in ejection fraction to 25-30% noted at that time.   A follow-up CT revealed a chronic filling defect in the distal left vertebral artery compatible with soft plaque or thromboembolic disease.  He has soft calcified plaque in the right vertebral artery region.  He had improved right middle cerebral artery.  patency since November 2015 without intracranial arterial occlusion.  There was evidence for his chronic right MCA infarct without acute intracranial abnormality.  Since I last saw him, his loop recorder was removed in November 2017 by Dr. Rayann Heman.  The patient has had significant weight loss purposefully and has lost 170 pounds.  He continues to use CPAP for  sleep apnea but has significant daytime sleepiness with a poor scale score 15.  He is not had any recent downloads, and has lost his car to allow this capability.  With his sleep study being greater than 51 years old, his significant weight loss, he qualifies for a new machine.  I'm scheduling him for a split-night slipped sleep study for further evaluation and a new machine will be ordered at that time based on his current data.  His blood pressure today is normal and in the past.  She had had issues with orthostasis.  He continues to take Midrin 2.5 mg twice a day.  He has noticed some leg swelling.  I am hesitant to add a diuretic, particularly since he is on Midodrin and repeat blood pressure by me was 104/68.  I have recommended compression stockings to help with this lower extremity edema particularly with a remote history of orthostasis.  He continues to be on Crestor 40 mg for hyperlipidemia with target LDL less than 70.  He is maintaining sinus rhythm and continues to be on eliquis anticoagulation.  There is no bleeding.  I will see him in several months after he has a new machine for further Cardiologic and sleep evaluation. Time spent: 25 minutes  [name], MD, [facility]  12/06/2016 8:07 AM

## 2016-12-04 NOTE — Patient Instructions (Signed)
Medication Instructions:  Your physician recommends that you continue on your current medications as directed. Please refer to the Current Medication list given to you today.   Testing/Procedures: Your physician has recommended that you have a sleep study. This test records several body functions during sleep, including: brain activity, eye movement, oxygen and carbon dioxide blood levels, heart rate and rhythm, breathing rate and rhythm, the flow of air through your mouth and nose, snoring, body muscle movements, and chest and belly movement.   Follow-Up: Your physician wants you to follow-up in: 4-5 MONTHS with Dr. Claiborne Billings. You will receive a reminder letter in the mail two months in advance. If you don't receive a letter, please call our office to schedule the follow-up appointment.   Any Other Special Instructions Will Be Listed Below (If Applicable).   How to Use Compression Stockings Compression stockings are elastic socks that squeeze the legs. They help to increase blood flow to the legs, decrease swelling in the legs, and reduce the chance of developing blood clots in the lower legs. Compression stockings are often used by people who:  Are recovering from surgery.  Have poor circulation in their legs.  Are prone to getting blood clots in their legs.  Have varicose veins.  Sit or stay in bed for long periods of time.  How to use compression stockings Before you put on your compression stockings:  Make sure that they are the correct size. If you do not know your size, ask your health care provider.  Make sure that they are clean, dry, and in good condition.  Check them for rips and tears. Do not put them on if they are ripped or torn.  Put your stockings on first thing in the morning, before you get out of bed. Keep them on for as long as your health care provider advises. When you are wearing your stockings:  Keep them as smooth as possible. Do not allow them to bunch up.  It is especially important to prevent the stockings from bunching up around your toes or behind your knees.  Do not roll the stockings downward and leave them rolled down. This can decrease blood flow to your leg.  Change them right away if they become wet or dirty. 1.   When you take off your stockings, inspect your legs and feet. Anything that does not seem normal may require medical attention. Look for:  Open sores.  Red spots.  Swelling.  Information and tips  Do not stop wearing your compression stockings without talking to your health care provider first.  Wash your stockings every day with mild detergent in cold or warm water. Do not use bleach. Air-dry your stockings or dry them in a clothes dryer on low heat.  Replace your stockings every 3-6 months.  If skin moisturizing is part of your treatment plan, apply lotion or cream at night so that your skin will be dry when you put on the stockings in the morning. It is harder to put the stockings on when you have lotion on your legs or feet. Contact a health care provider if: Remove your stockings and seek medical care if:  You have a feeling of pins and needles in your feet or legs.  You have any new changes in your skin.  You have skin lesions that are getting worse.  You have swelling or pain that is getting worse.  Get help right away if:  You have numbness or tingling in your lower  legs that does not get better right after you take the stockings off.  Your toes or feet become cold and blue.  You develop open sores or red spots on your legs that do not go away.  You see or feel a warm spot on your leg.  You have new swelling or soreness in your leg.  You are short of breath or you have chest pain for no reason.  You have a rapid or irregular heartbeat.  You feel light-headed or dizzy. This information is not intended to replace advice given to you by your health care provider. Make sure you discuss any  questions you have with your health care provider. Document Released: 12/30/2008 Document Revised: 08/02/2015 Document Reviewed: 02/09/2014 Elsevier Interactive Patient Education  [employer].    If you need a refill on your cardiac medications before your next appointment, please call your pharmacy.

## 2016-12-10 ENCOUNTER — Telehealth: Payer: Self-pay | Admitting: Family Medicine

## 2016-12-10 NOTE — Telephone Encounter (Signed)
°  Patient Name: AVYAN LIVESAY  DOB: September 30, 1950    Initial Comment Caller states that her son feel like he is having allergies. He has nasal congestion. She states he is on a lot of different medications and wants to make sure allergy medication wont interact.    Nurse Assessment  Nurse: Joline Salt, RN, Malachy Mood Date/Time Eilene Ghazi Time): 12/10/2016 5:08:11 PM  Confirm and document reason for call. If symptomatic, describe symptoms. ---Caller states that her son feel like he is having allergies. He has nasal congestion. She states he is on a lot of different medications and wants to make sure allergy medication won't interact. He's had a Caller states that her son feel like he is having allergies. He has nasal congestion. She states he is on a lot of different medications and wants to make sure allergy medication wont interact. .  Does the patient have any new or worsening symptoms? ---Yes  Will a triage be completed? ---Yes  Related visit to physician within the last 2 weeks? ---N/A  Does the PT have any chronic conditions? (i.e. diabetes, asthma, etc.) ---Yes  List chronic conditions. ---CVA, CHF, and Hypotension and allergic to Benadryl. He takes Potassium,Elaquis, Zoloft, Energy manager, Protonix, Melatonin, Midodrone (Hypotension) Aspirin , fludrocortisone, Sinocot, Doxepin (anxiety)  Is this a behavioral health or substance abuse call? ---No     Guidelines    Guideline Title Affirmed Question Affirmed Notes  Nasal Allergies (Hay Fever) [1] Nasal allergies AND [8] only certain times of year AND [3] hay fever diagnosis has never been confirmed by a HCP    Final Disposition User   See PCP within [place], RN, Malachy Mood    Comments  Advised patient to use the Flonase that he has to relieve nasal allergies tonight, Call his PCP in the AM to see if there is anything else he can take. In the meantime try drinking extra warm fluids for thinning and loosening secretions and soothing the throat muscles to  decrease cough reflex.   Referrals  REFERRED TO PCP OFFICE   Caller Disagree/Comply Comply  Caller Understands Yes  PreDisposition Did not know what to do

## 2016-12-10 NOTE — Telephone Encounter (Signed)
Seen 11/26/16 for CPE-did not complete AWV with Katha Cabal

## 2016-12-11 MED ORDER — FLUTICASONE PROPIONATE 50 MCG/ACT NA SUSP: 2.0000 | Freq: Every day | NASAL | Status: DC

## 2016-12-11 NOTE — Telephone Encounter (Signed)
Spoke to pts wife and advised. States pt has Rx for flonase and will begin taking as directed

## 2016-12-11 NOTE — Telephone Encounter (Addendum)
I wouldn't take an antihistamine since he had trouble with benadryl.  See allergy list.  Would use flonase 2 sprays per nostril daily in the meantime.  Thanks.

## 2016-12-14 ENCOUNTER — Ambulatory Visit (HOSPITAL_BASED_OUTPATIENT_CLINIC_OR_DEPARTMENT_OTHER): Payer: Medicare Other | Attending: Cardiovascular Disease | Admitting: Cardiovascular Disease

## 2016-12-14 DIAGNOSIS — I2510 Atherosclerotic heart disease of native coronary artery without angina pectoris: Secondary | ICD-10-CM | POA: Diagnosis present

## 2016-12-14 DIAGNOSIS — I5042 Chronic combined systolic (congestive) and diastolic (congestive) heart failure: Secondary | ICD-10-CM | POA: Insufficient documentation

## 2016-12-14 DIAGNOSIS — I25810 Atherosclerosis of coronary artery bypass graft(s) without angina pectoris: Secondary | ICD-10-CM

## 2016-12-14 DIAGNOSIS — G4733 Obstructive sleep apnea (adult) (pediatric): Secondary | ICD-10-CM | POA: Diagnosis present

## 2017-01-02 ENCOUNTER — Encounter: Payer: Self-pay | Admitting: Family Medicine

## 2017-01-12 NOTE — Procedures (Signed)
Patient Name: Gregory Wiley, [place] Date: 12/14/2016 Gender: Male D.O.B: [birth year] Age (years): 40 Referring Provider: Shelva Majestic MD, ABSM Height (inches): 72 Interpreting Physician: Shelva Majestic MD, ABSM Weight (lbs): 194 RPSGT: Gerhard Perches BMI: 26 MRN: [id number] Neck Size: 14.00  CLINICAL INFORMATION Sleep Study Type: Split Night CPAP  Indication for sleep study: Fatigue, OSA  Epworth Sleepiness Score:  15  SLEEP STUDY TECHNIQUE As per the AASM Manual for the Scoring of Sleep and Associated Events v2.3 (April 2016) with a hypopnea requiring 4% desaturations.  The channels recorded and monitored were frontal, central and occipital EEG, electrooculogram (EOG), submentalis EMG (chin), nasal and oral airflow, thoracic and abdominal wall motion, anterior tibialis EMG, snore microphone, electrocardiogram, and pulse oximetry. Continuous positive airway pressure (CPAP) was initiated when the patient met split night criteria and was titrated according to treat sleep-disordered breathing.  MEDICATIONS *    acetaminophen (TYLENOL) 325 MG tablet    Take 1 tablet (325 mg total) by mouth every 6 (six) hours as needed for mild pain.           *    apixaban (ELIQUIS) 5 MG TABS tablet    Take 1 tablet (5 mg total) by mouth 2 (two) times daily.    180 tablet    3 *    aspirin 81 MG EC tablet    Take 1 tablet (81 mg total) by mouth daily. Swallow whole.    90 tablet    3 *    cholecalciferol (VITAMIN D) 1000 units tablet    Take 1,000 Units by mouth daily.           *    doxepin (SINEQUAN) 10 MG capsule    Take 1 capsule (10 mg total) by mouth at bedtime.    30 capsule    11 *    fludrocortisone (FLORINEF) 0.1 MG tablet    Take 1 tablet (100 mcg total) by mouth 2 (two) times daily.    60 tablet    3 *    HYDROcodone-acetaminophen (NORCO/VICODIN) 5-325 MG tablet    Take 1 tablet by mouth every 6 (six) hours as needed for moderate pain.           *    Melatonin 10 MG CAPS    Take 10  mg by mouth at bedtime as needed (sleep).    90 capsule    3 *    midodrine (PROAMATINE) 2.5 MG tablet    TAKE 1 TABLET BY MOUTH 2 TIMES DAILY WITH MEALS.    60 tablet    3 *    nitroGLYCERIN (NITROSTAT) 0.4 MG SL tablet    Place 1 tablet (0.4 mg total) under the tongue every 5 (five) minutes as needed. For chest pain (Patient taking differently: Place 0.4 mg under the tongue every 5 (five) minutes as needed for chest pain. )    25 tablet    12 *    NON FORMULARY    Rejuvenix - Take 2 capsules eacg\h morning           *    NON FORMULARY    Magnical-O   Take 2 capsules in the evening           *    pantoprazole (PROTONIX) 40 MG tablet    TAKE 1 TABLET BY MOUTH 2 TIMES DAILY.    60 tablet    6 *    potassium chloride (K-DUR) 10 MEQ tablet  Take 2 tablets (20 mEq total) by mouth 2 (two) times daily.    60 tablet    3 *    PRESCRIPTION MEDICATION    Inhale into the lungs at bedtime. CPAP           *    prochlorperazine (COMPAZINE) 10 MG tablet    Take 1 tablet (10 mg total) by mouth every 8 (eight) hours as needed (headache, nausea or vomiting).    10 tablet    0 *    rosuvastatin (CRESTOR) 40 MG tablet    Take 1 tablet (40 mg total) by mouth daily.    90 tablet    3 *    senna-docusate (SENNA S) 8.6-50 MG tablet    Take 1 tablet by mouth 2 (two) times daily.    60 tablet    prn *    sertraline (ZOLOFT) 100 MG tablet    Take 1 tablet (100 mg total) by mouth daily.    30 tablet    11 *    tiZANidine (ZANAFLEX) 4 MG capsule    Take 4 mg by mouth as needed.              Medications self-administered by patient taken the night of the study : DOXEPIN, ELIQUIS, FLUDROCORTISONE, MELATONIN, MIDODRINE, MAGNICAL-O, PANTOPRAZOLE, POTASSIUM CHLORIDE, DOCUSATE -SENNA, SERTRALINE  RESPIRATORY PARAMETERS Diagnostic  Total AHI (/hr): 36.1 RDI (/hr): 40.4 OA Index (/hr): 7 CA Index (/hr): 0.0 REM AHI (/hr): N/A NREM AHI (/hr): 36.1 Supine AHI (/hr): 36.1 Non-supine AHI (/hr): N/A Min O2 Sat (%): 87.00 Mean O2  (%): 94.97 Time below 88% (min): 0.6   Titration  Optimal Pressure (cm): 12 AHI at Optimal Pressure (/hr): 2.5 Min O2 at Optimal Pressure (%): 93.0 Supine % at Optimal (%): 100 Sleep % at Optimal (%): 85    SLEEP ARCHITECTURE The recording time for the entire night was 396.7 minutes.  During a baseline period of 197.4 minutes, the patient slept for 154.5 minutes in REM and nonREM, yielding a sleep efficiency of 78.3%. Sleep onset after lights out was 14.6 minutes with a REM latency of N/A minutes. The patient spent 9.06% of the night in stage N1 sleep, 90.61% in stage N2 sleep, 0.32% in stage N3 and 0.00% in REM.  During the titration period of 191.8 minutes, the patient slept for 166.5 minutes in REM and nonREM, yielding a sleep efficiency of 86.8%. Sleep onset after CPAP initiation was 7.2 minutes with a REM latency of 120.5 minutes. The patient spent 8.41% of the night in stage N1 sleep, 64.26% in stage N2 sleep, 11.41% in stage N3 and 15.92% in REM.  CARDIAC DATA The 2 lead EKG demonstrated sinus rhythm. The mean heart rate was 57.65 beats per minute. Other EKG findings include: None.  LEG MOVEMENT DATA The total Periodic Limb Movements of Sleep (PLMS) were 0. The PLMS index was 0.00 .  IMPRESSIONS - Severe obstructive sleep apnea occurred during the diagnostic portion of the study (AHI 36.1/h; RDI 40.4/h).   CPAP was started at 5 cm and was titrated up to 12 cm water pressure.  The AHI at 11 cm was 0.  AHI 12 cm was 1.2/h with one central apneic event at 12 cm. - No significant central sleep apnea occurred during the diagnostic portion of the study (CAI = 0.0/hour). - Absence of REM sleep on the diagnostic portion of the study - The patient had minimal desaturation during the diagnostic portion of the  study (Min O2 87.00%) - The patient snored with loud snoring volume during the diagnostic portion of the study. - No cardiac abnormalities were noted during this study. - Clinically  significant periodic limb movements did not occur during sleep.  DIAGNOSIS - Obstructive Sleep Apnea (327.23 [G47.33 ICD-10])  RECOMMENDATIONS - Recommend an initial trial of CPAP therapy with EPR of 3 at 11 cm water pressure with heated humidification. A Medium size Philips Respironics Nasal Pillow Mask Dreamwear Under Nose Frame (M) mask was used for the titration study. - Effort should be made to optimize nasal and oral pharyngeal patency - Avoid alcohol, sedatives and other CNS depressants that may worsen sleep apnea and disrupt normal sleep architecture. - Sleep hygiene should be reviewed to assess factors that may improve sleep quality. - Weight management and regular exercise should be initiated. - Recommend a download be obtained in 4 weeks and sleep clinic evaluation  for re-evaluation after 4 weeks of therapy.  [Electronically signed] 01/12/2017 10:08 PM  Shelva Majestic MD, [facility], [place], American Board of Sleep Medicine   NPI: [id number] [place] PH: [phone]   FX: [phone] [place]

## 2017-01-16 ENCOUNTER — Telehealth: Payer: Self-pay | Admitting: *Deleted

## 2017-01-16 NOTE — Telephone Encounter (Signed)
Left message to call back  

## 2017-01-16 NOTE — Progress Notes (Signed)
Left message to call back 11/1

## 2017-01-16 NOTE — Telephone Encounter (Signed)
-----   Message from [name], MD sent at 01/12/2017 10:14 PM EDT ----- Angie Fava   Schedule with DME company for CPAP set up and sleep clinic eval

## 2017-01-17 NOTE — Telephone Encounter (Signed)
S/w pt/wife they state that company is [facility] [employer]

## 2017-01-17 NOTE — Telephone Encounter (Signed)
Follow up     Pt wife is returning call to nurse. Please call.

## 2017-01-20 ENCOUNTER — Encounter (HOSPITAL_BASED_OUTPATIENT_CLINIC_OR_DEPARTMENT_OTHER): Payer: Medicare Other

## 2017-01-21 NOTE — Telephone Encounter (Signed)
Orders for CPAP (new machine) faxed to [facility]

## 2017-01-21 NOTE — Progress Notes (Signed)
11/6-see telephone note, patient aware and orders have been faxed to [facility].

## 2017-02-03 ENCOUNTER — Ambulatory Visit: Payer: Self-pay

## 2017-02-03 NOTE — Telephone Encounter (Addendum)
Wife called on pt's behalf.  Reported pt. has had cough for about 9 days, that has progressively worsened.  Reported he also has green nasal drainage and is coughing up white phlegm intermittently.  Wife voiced concern that the pt. has coughing episodes that are severe at times, and difficulty getting his breath, during those times.  Reported he ran out of his Cough syrup 2 days ago that was prescribed by the doctor.  Related that he is on Eliquis and ASA, due to hx of stroke.  Wife voiced concern of the severe coughing episodes, and the possibility of him bleeding from the blood thinners; reported this has occurred in the past.  Denied any blood in sputum.  Denied chest pain/ tightness.  Pt. stated he thought he had fever several days ago, but does not think he has fever now.  Care advice given per protocol.  Appt. Given for 11/20 @ [facility] [place], as no appts. avail. @ [place].  Encouraged to go to the ER tonight if symptoms worsen.  Verb. Understanding.      Reason for Disposition . [1] Nasal discharge AND [2] present > 10 days  Answer Assessment - Initial Assessment Questions 1. ONSET: "When did the cough begin?"      Started about 9 days ago 2. SEVERITY: "How bad is the cough today?"       Intermittent coughing episodes 3. RESPIRATORY DISTRESS: "Describe your breathing."      Not short of breath 4. FEVER: "Do you have a fever?" If so, ask: "What is your temperature, how was it measured, and when did it start?"     Denied fever 5. SPUTUM: "Describe the color of your sputum" (clear, white, yellow, green)     A little bit of phlegm  6. HEMOPTYSIS: "Are you coughing up any blood?" If so ask: "How much?" (flecks, streaks, tablespoons, etc.)     no 7. CARDIAC HISTORY: "Do you have any history of heart disease?" (e.g., heart attack, congestive heart failure)      Has had heart attack in past; about 6 yrs. Ago; has congestive heart failure 8. LUNG HISTORY: "Do you have any history of lung  disease?"  (e.g., pulmonary embolus, asthma, emphysema)     Hx of PE after having a stroke; about 1.5 yrs. ago 9. PE RISK FACTORS: "Do you have a history of blood clots?" (or: recent major surgery, recent prolonged travel, bedridden )     On blood thinner; Eliquis, and daily ASA  10. OTHER SYMPTOMS: "Do you have any other symptoms?" (e.g., runny nose, wheezing, chest pain)       Runny nose with green mucus  11. PREGNANCY: "Is there any chance you are pregnant?" "When was your last menstrual period?"       n/a 12. TRAVEL: "Have you traveled out of the country in the last month?" (e.g., travel history, exposures)       No  Protocols used: [place]

## 2017-02-04 ENCOUNTER — Ambulatory Visit (INDEPENDENT_AMBULATORY_CARE_PROVIDER_SITE_OTHER): Payer: Medicare Other | Admitting: Family Medicine

## 2017-02-04 ENCOUNTER — Encounter: Payer: Self-pay | Admitting: Family Medicine

## 2017-02-04 VITALS — BP 105/66 | HR 62 | Temp 97.6°F | Ht 72.0 in | Wt 197.0 lb

## 2017-02-04 DIAGNOSIS — R059 Cough, unspecified: Secondary | ICD-10-CM

## 2017-02-04 DIAGNOSIS — R05 Cough: Secondary | ICD-10-CM | POA: Diagnosis not present

## 2017-02-04 MED ORDER — HYDROCODONE-HOMATROPINE 5-1.5 MG/5ML PO SYRP: 5.0000 mL | ORAL_SOLUTION | Freq: Three times a day (TID) | ORAL | 0 refills | Status: DC | PRN

## 2017-02-04 NOTE — Progress Notes (Signed)
SUBJECTIVE:  Gregory Wiley is a 66 y.o. male who complains of coryza, congestion and productive cough for 8 days. He denies a history of anorexia and chest pain and denies a history of asthma. Patient denies smoke cigarettes.   Current Outpatient Medications on File Prior to Visit  Medication Sig Dispense Refill  . acetaminophen (TYLENOL) 325 MG tablet Take 1 tablet (325 mg total) by mouth every 6 (six) hours as needed for mild pain.    Marland Kitchen apixaban (ELIQUIS) 5 MG TABS tablet Take 1 tablet (5 mg total) by mouth 2 (two) times daily. 180 tablet 3  . aspirin 81 MG EC tablet Take 1 tablet (81 mg total) by mouth daily. Swallow whole. 90 tablet 3  . cholecalciferol (VITAMIN D) 1000 units tablet Take 1,000 Units by mouth daily.    Marland Kitchen doxepin (SINEQUAN) 10 MG capsule Take 1 capsule (10 mg total) by mouth at bedtime. 30 capsule 11  . fludrocortisone (FLORINEF) 0.1 MG tablet Take 1 tablet (100 mcg total) by mouth 2 (two) times daily. 60 tablet 3  . fluticasone (FLONASE) 50 MCG/ACT nasal spray Place 2 sprays into both nostrils daily.    Marland Kitchen HYDROcodone-acetaminophen (NORCO/VICODIN) 5-325 MG tablet Take 1 tablet by mouth every 6 (six) hours as needed for moderate pain.    . Melatonin 10 MG CAPS Take 10 mg by mouth at bedtime as needed (sleep). 90 capsule 3  . midodrine (PROAMATINE) 2.5 MG tablet TAKE 1 TABLET BY MOUTH 2 TIMES DAILY WITH MEALS. 60 tablet 3  . nitroGLYCERIN (NITROSTAT) 0.4 MG SL tablet Place 1 tablet (0.4 mg total) under the tongue every 5 (five) minutes as needed. For chest pain (Patient taking differently: Place 0.4 mg under the tongue every 5 (five) minutes as needed for chest pain. ) 25 tablet 12  . pantoprazole (PROTONIX) 40 MG tablet TAKE 1 TABLET BY MOUTH 2 TIMES DAILY. 60 tablet 6  . potassium chloride (K-DUR) 10 MEQ tablet Take 2 tablets (20 mEq total) by mouth 2 (two) times daily. 60 tablet 3  . PRESCRIPTION MEDICATION Inhale into the lungs at bedtime. CPAP    . prochlorperazine  (COMPAZINE) 10 MG tablet Take 1 tablet (10 mg total) by mouth every 8 (eight) hours as needed (headache, nausea or vomiting). 10 tablet 0  . rosuvastatin (CRESTOR) 40 MG tablet Take 1 tablet (40 mg total) by mouth daily. 90 tablet 3  . senna-docusate (SENNA S) 8.6-50 MG tablet Take 1 tablet by mouth 2 (two) times daily. 60 tablet prn  . sertraline (ZOLOFT) 100 MG tablet Take 1 tablet (100 mg total) by mouth daily. 30 tablet 11  . tiZANidine (ZANAFLEX) 4 MG capsule Take 4 mg by mouth as needed.      No current facility-administered medications on file prior to visit.     Allergies  Allergen Reactions  . Diphenhydramine Hcl Anaphylaxis  . Adhesive [Tape] Other (See Comments)    Tears skin - please use paper tape  . Proscar [Finasteride] Other (See Comments)    Lightheaded.   . Tamsulosin Other (See Comments)    Dizzy, vomiting.     Past Medical History:  Diagnosis Date  . Adrenal insufficiency ([place])   . Anxiety   . Basal cell carcinoma of skin of left ankle   . Bleeding stomach ulcer 2015  . Coronary artery disease    stent, then CABG 07/20/10  . Daily headache    "for the past month" (11/11/2014)  . Depression   . Diverticulosis   .  GERD (gastroesophageal reflux disease)   . Gout   . H/O cardiomyopathy    ischemic, now last echo 07/30/11, EF 38%W  . History of hiatal hernia   . Hyperlipidemia   . Hypertension   . Internal hemorrhoids   . Left hemiplegia ([place])   . NSTEMI (non-ST elevated myocardial infarction) ([place])    NSTEMI- last cath 06/2011-Stent to LCX-DES, last nuc 07/30/11 low risk  . Obesity (BMI 30-39.9)   . OSA on CPAP    since July 2013- uses CPAP sometimes , states he has lost 147 lbs. since stroke & doesn't use the CPAP as much as he use to.   . Plantar fasciitis of left foot   . Pneumonia    hosp. 12/2014  . Pulmonary embolism ([place]) 03/2014  . Stroke ([place]) 01/2014   "no control of LUE, can slightly LLE; memory problems since" (11/11/2014)  . Tubular adenoma of  colon     Past Surgical History:  Procedure Laterality Date  . BASAL CELL CARCINOMA EXCISION Left 2015   ankle  . CARDIAC SURGERY    . CATARACT EXTRACTION Bilateral   . COLONOSCOPY N/A 02/10/2013   Performed by Ladene Artist, MD at [facility]  . CORONARY ANGIOPLASTY WITH STENT PLACEMENT  07/01/2011   DES-Resolute to native LCX  . CORONARY ANGIOPLASTY WITH STENT PLACEMENT  12/01/2007   COMPLEX 5 LESION PCI INCLUDING CUTTING BALLOON AND 4 CYPHER DESs  . CORONARY ARTERY BYPASS GRAFT  07/20/2010    LIMA-LAD; VG-ACUTE MARG of RCA; Seq VG-distal RCA & then pda  . ESOPHAGOGASTRODUODENOSCOPY  01/2014   gastric ulcer, erosive gastroduodenitis, H Pylori negative.   . ESOPHAGOGASTRODUODENOSCOPY (EGD) WITH PROPOFOL N/A 11/14/2014   Performed by Milus Banister, MD at [facility]  . HAND SURGERY Right    to take glass out  . HERNIA REPAIR    . INTRAMEDULLARY (IM) NAIL INTERTROCHANTRIC RIGHT HIP Right 02/07/2015   Performed by Renette Butters, MD at [facility]  . KNEE ARTHROSCOPY Right   . LEFT HEART CATHETERIZATION WITH CORONARY/GRAFT ANGIOGRAM  07/01/2011   Performed by Sanda Klein, MD at [facility] CATH LAB  . LOOP RECORDER IMPLANT  02/15/2014   MDT LINQ implanted by Dr Rayann Heman for cryptogenic stroke  . Loop Recorder Removal N/A 02/14/2016   Performed by Thompson Grayer, MD at [name] CV LAB  . PERCUTANEOUS CORONARY STENT INTERVENTION (PCI-S) Right 07/01/2011   Performed by Sanda Klein, MD at [facility] CATH LAB  . PICC LINE PLACE PERIPHERAL ([place] HX)  06/2015  . RETINAL DETACHMENT SURGERY Right   . TRANSESOPHAGEAL ECHOCARDIOGRAM (TEE) N/A 02/15/2014   Performed by Josue Hector, MD at [facility]  . UMBILICAL HERNIA REPAIR  2006    Family History  Problem Relation Age of Onset  . Diabetes type II Mother   . CAD Father 64       CABG, PCI, died at 19  . Heart attack Father 29  . Hypertension Brother   . Diabetes Brother   . Hyperlipidemia Brother   . Brain cancer Maternal Grandmother    . COPD Paternal Grandfather   . Coronary artery disease Unknown   . Other Unknown        denies family history of DVT/PE  . Colon cancer Neg Hx   . Stroke Neg Hx   . Prostate cancer Neg Hx     Social History   Socioeconomic History  . Marital status: Married    Spouse name: Not on  file  . Number of children: 5  . Years of education: Not on file  . Highest education level: Not on file  Social Needs  . Financial resource strain: Not on file  . Food insecurity - worry: Not on file  . Food insecurity - inability: Not on file  . Transportation needs - medical: Not on file  . Transportation needs - non-medical: Not on file  Occupational History    Employer: [employer]    Comment: works at the [employer]  . Smoking status: Former Smoker    Packs/day: 2.00    Years: 4.00    Pack years: 8.00    Types: Cigarettes    Last attempt to quit: 01/30/1969    Years since quitting: 48.0  . Smokeless tobacco: Never Used  . Tobacco comment: "quit smoking cigarettes  in the 1970's"  Substance and Sexual Activity  . Alcohol use: No    Alcohol/week: 0.0 oz  . Drug use: No    Comment: "smoked some pot in college"  . Sexual activity: Not Currently  Other Topics Concern  . Not on file  Social History Narrative   Quit smoking 40 years ago   Married 1996   Likes to play guitar and sing with church group unable since 02/12/14 stroke    [employer] 1970-1972, [place], no service related issues.     The PMH, PSH, Social History, Family History, Medications, and allergies have been reviewed in [facility], and have been updated if relevant.  OBJECTIVE:  BP 105/66 (BP Location: Left Arm, Patient Position: Sitting, Cuff Size: Normal)   Pulse 62   Temp 97.6 F (36.4 C) (Oral)   Ht 6' (1.829 m)   Wt 197 lb (89.4 kg)   SpO2 97%   BMI 26.72 kg/m   He appears well, vital signs are as noted. Ears normal.  Throat and pharynx normal.  Neck supple. No adenopathy in the neck. Nose is congested.  Sinuses non tender. The chest is clear, without wheezes or rales.  ASSESSMENT:  viral upper respiratory illness  PLAN: Symptomatic therapy suggested: push fluids, rest and return office visit prn if symptoms persist or worsen. Lack of antibiotic effectiveness discussed with him. Call or return to clinic prn if these symptoms worsen or fail to improve as anticipated.

## 2017-02-12 ENCOUNTER — Telehealth: Payer: Self-pay | Admitting: Cardiovascular Disease

## 2017-02-12 NOTE — Telephone Encounter (Signed)
New message   Pt wife verbalized that she is checking up on the prescription for her husbands C-PAP medication

## 2017-02-12 NOTE — Telephone Encounter (Signed)
Returned the call to the patient's wife to let her know the that prescription had been faxed to [facility] and to call back in a few days if they haven't heard anything. She verbalized her understanding.

## 2017-02-15 ENCOUNTER — Other Ambulatory Visit: Payer: Self-pay | Admitting: Family Medicine

## 2017-03-06 ENCOUNTER — Ambulatory Visit (INDEPENDENT_AMBULATORY_CARE_PROVIDER_SITE_OTHER): Payer: Medicare Other | Admitting: Neurology

## 2017-03-06 ENCOUNTER — Encounter: Payer: Self-pay | Admitting: Neurology

## 2017-03-06 ENCOUNTER — Other Ambulatory Visit: Payer: Self-pay

## 2017-03-06 VITALS — BP 128/75 | HR 67 | Resp 18 | Ht 72.0 in | Wt 203.0 lb

## 2017-03-06 DIAGNOSIS — I69952 Hemiplegia and hemiparesis following unspecified cerebrovascular disease affecting left dominant side: Secondary | ICD-10-CM

## 2017-03-06 DIAGNOSIS — I63411 Cerebral infarction due to embolism of right middle cerebral artery: Secondary | ICD-10-CM | POA: Diagnosis not present

## 2017-03-06 DIAGNOSIS — Z7901 Long term (current) use of anticoagulants: Secondary | ICD-10-CM | POA: Diagnosis not present

## 2017-03-06 DIAGNOSIS — M542 Cervicalgia: Secondary | ICD-10-CM

## 2017-03-06 DIAGNOSIS — G4733 Obstructive sleep apnea (adult) (pediatric): Secondary | ICD-10-CM

## 2017-03-06 MED ORDER — TIZANIDINE HCL 4 MG PO CAPS: 4.0000 mg | ORAL_CAPSULE | ORAL | 3 refills | Status: AC | PRN

## 2017-03-06 MED ORDER — HYDROCODONE-ACETAMINOPHEN 5-325 MG PO TABS: 1.0000 | ORAL_TABLET | Freq: Every day | ORAL | 0 refills | Status: DC | PRN

## 2017-03-06 NOTE — Progress Notes (Signed)
[facility]  PATIENT: Gregory Wiley DOB: 03-03-1951  REFERRING CLINICIAN: Tamsen Roers HISTORY FROM: patient   HISTORICAL  CHIEF COMPLAINT:  Chief Complaint  Patient presents with  . History of CVA    Denies new stroke sx. and sts. is compliant with Eliquis.  Sts. fell one mo. ago while trying to reach a drink that was just out of reach. C/O neck pain since then. Gregory Wiley  . 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   He feels he is stable for the most part.     Update 03/06/2017:    He denies any new neurologic symptoms.    He has spastic left hemiplegia and left foot drop.  He uses his cane around the house.  He can go 30 feet without too much of a problem A brace is heavy and he would rather walk without it.    He had one fall last month.   His foot is not flat when he stands up on the floor.   His neck is hurting more.  Pain stays in his neck with no radiation into the arm.    He feels left spasticity is about the same.    At times, the spasticity can be painful in the leg.  Cognition is stable. His left sided neglect improved over time.   He is on Eliquis as anticoagulation.  He has OSA and has a new machine and mask.   He showed me an app and he had 3.3 - 8.1 AHI over the past week.         He notes neck pain, right greater than left. He does not want trigger point injection. In the past, TPI's have helped but he finds them to be painful.  From 09/16/2016: Stroke:   He denies any new stroke symptoms. He remains on Elavil risk for stroke prophylaxis. He has not had any transient ischemic attacks or other transient neurologic symptoms. He continues to have left hemiplegia and he gets slurred speech when he is tired.   Stroke History:  On 02/12/2014, he woke up with weakness on the left side and was unable to stand up. He went to the emergency room.Marland Kitchen MRI of the brain showed an acute  right MCA territory stroke that mostly involved the right temporal lobe with extension to the insula and the basal ganglia on the right. MR angiogram showed an occlusion of the right M1 segment  Of note, he had been on aspirin and Plavix that had been discontinued a few weeks earlier due to a bleeding ulcer.   Ultrasound of the carotid arteries showed less than 39% stenosis. A transesophageal echocardiogram showed a LVEF equals 45%, there was septal hypokinesia, no PFO or ASD was identified.  Cardioembolism was suspected.  He was initially placed on Plavix but that was subsequently changed to Xarelto after he was found to have pulmonary embolisms while at rehabilitation center.  Gait/Strength/sensation:  Due to left hemiplegia, his gait is poor. He has had a few falls. With a quad cane he can walk about 30-50 feet if he is not tired and about 20-30 feet if he is tired. His bathroom is 30 feet away.  In the past, he was having orthostatic hypotension and had some episodes of syncope.    His leg also has left sided spasticity.   He also has numbness on the left side with  some pain. He had some neglect and numbness initially that improved.   He has been on baclofen in the past but felt no benefit and felt very sleepy.    Orthostatic hypotension:     He is on Florinef and midodrine.  He gets pre-syncope but no recent syncope.     Neck pain/Headache:  He continues to report a neck pain, better than last year but still daily.   In the past, occiptial nerve blocks helped to reduce the severe headache and neck pain.    Bladder:  He has urinary frequency and urgency this year.   He has not had incontinence.   Flomax was started but he flet dizzy and stopped.    OSA/sleep/sleepiness:    In the past, he had obstructive sleep apnea but stopped CPAP as he did not like the mask. However, he has lost > 150 pounds since the stroke.   He still snores but wife has not noted OSA.      He has excessive daytime sleepiness.   Methylphenidate 20 mg never really helped.     In the past, EDS was better when he wore CPAP but he can't wear he whole night.      Insomnia:   He has sleep maintenance insomnia > sleep onet (usually quick).    He takes melatonin at bedtime.   Cognitive:   He and his wife note some decreased attention and decrease executive function abilities.  He had a neglect after the stroke which appears to improve.  Mood:    He continues to feel depressed. He thinks this certainly may have helped slightly but not much. He gets irritable very easily. He denies anxiety.  REVIEW OF SYSTEMS:  Constitutional: No fevers, chills, sweats, or change in appetite.  He sleeps poorly.    He is very tired Eyes: No visual changes, double vision, eye pain Ear, nose and throat: No hearing loss, ear pain, nasal congestion, sore throat Cardiovascular: No chest pain, palpitations Respiratory:  No shortness of breath at rest or with exertion.   No wheezes GastrointestinaI: No nausea, vomiting, diarrhea, abdominal pain, fecal incontinence Genitourinary:  No dysuria, urinary retention or frequency.  No nocturia. Musculoskeletal:  No neck pain, back pain but he has shoulder and hip pain Integumentary: No rash, pruritus, skin lesions Neurological: as above Psychiatric: No depression at this time.  No anxiety Endocrine: No palpitations, diaphoresis, change in appetite, change in weigh.  He notes feeling cold and increased thirst Hematologic/Lymphatic:  No anemia, purpura, petechiae. Allergic/Immunologic: No itchy/runny eyes, nasal congestion, recent allergic reactions, rashes  ALLERGIES: Allergies  Allergen Reactions  . Diphenhydramine Hcl Anaphylaxis  . Adhesive [Tape] Other (See Comments)    Tears skin - please use paper tape  . Proscar [Finasteride] Other (See Comments)    Lightheaded.   . Tamsulosin Other (See Comments)    Dizzy, vomiting.     HOME MEDICATIONS: Outpatient Medications Prior to Visit  Medication  Sig Dispense Refill  . acetaminophen (TYLENOL) 325 MG tablet Take 1 tablet (325 mg total) by mouth every 6 (six) hours as needed for mild pain.    Marland Kitchen apixaban (ELIQUIS) 5 MG TABS tablet Take 1 tablet (5 mg total) by mouth 2 (two) times daily. 180 tablet 3  . aspirin 81 MG EC tablet Take 1 tablet (81 mg total) by mouth daily. Swallow whole. 90 tablet 3  . cholecalciferol (VITAMIN D) 1000 units tablet Take 1,000 Units by mouth daily.    Marland Kitchen doxepin (  SINEQUAN) 10 MG capsule Take 1 capsule (10 mg total) by mouth at bedtime. 30 capsule 11  . fludrocortisone (FLORINEF) 0.1 MG tablet Take 1 tablet (100 mcg total) by mouth 2 (two) times daily. 60 tablet 3  . fluticasone (FLONASE) 50 MCG/ACT nasal spray Place 2 sprays into both nostrils daily.    . Melatonin 10 MG CAPS Take 10 mg by mouth at bedtime as needed (sleep). 90 capsule 3  . midodrine (PROAMATINE) 2.5 MG tablet TAKE 1 TABLET BY MOUTH 2 TIMES DAILY WITH MEALS. 60 tablet 3  . nitroGLYCERIN (NITROSTAT) 0.4 MG SL tablet Place 1 tablet (0.4 mg total) under the tongue every 5 (five) minutes as needed. For chest pain (Patient taking differently: Place 0.4 mg under the tongue every 5 (five) minutes as needed for chest pain. ) 25 tablet 12  . pantoprazole (PROTONIX) 40 MG tablet TAKE 1 TABLET BY MOUTH 2 TIMES DAILY. 60 tablet 6  . potassium chloride (K-DUR) 10 MEQ tablet TAKE 2 TABLETS BY MOUTH 3 TIMES DAILY. 180 tablet 6  . PRESCRIPTION MEDICATION Inhale into the lungs at bedtime. CPAP    . prochlorperazine (COMPAZINE) 10 MG tablet Take 1 tablet (10 mg total) by mouth every 8 (eight) hours as needed (headache, nausea or vomiting). 10 tablet 0  . rosuvastatin (CRESTOR) 40 MG tablet Take 1 tablet (40 mg total) by mouth daily. 90 tablet 3  . senna-docusate (SENNA S) 8.6-50 MG tablet Take 1 tablet by mouth 2 (two) times daily. 60 tablet prn  . sertraline (ZOLOFT) 100 MG tablet Take 1 tablet (100 mg total) by mouth daily. 30 tablet 11  . HYDROcodone-acetaminophen  (NORCO/VICODIN) 5-325 MG tablet Take 1 tablet by mouth every 6 (six) hours as needed for moderate pain.    Marland Kitchen tiZANidine (ZANAFLEX) 4 MG capsule Take 4 mg by mouth as needed.     Marland Kitchen HYDROcodone-homatropine (HYCODAN) 5-1.5 MG/5ML syrup Take 5 mLs by mouth every 8 (eight) hours as needed for cough. 120 mL 0  . potassium chloride (K-DUR) 10 MEQ tablet Take 2 tablets (20 mEq total) by mouth 2 (two) times daily. 60 tablet 3   No facility-administered medications prior to visit.     PAST MEDICAL HISTORY: Past Medical History:  Diagnosis Date  . Adrenal insufficiency ([place])   . Anxiety   . Basal cell carcinoma of skin of left ankle   . Bleeding stomach ulcer 2015  . Coronary artery disease    stent, then CABG 07/20/10  . Daily headache    "for the past month" (11/11/2014)  . Depression   . Diverticulosis   . GERD (gastroesophageal reflux disease)   . Gout   . H/O cardiomyopathy    ischemic, now last echo 07/30/11, EF 38%W  . History of hiatal hernia   . Hyperlipidemia   . Hypertension   . Internal hemorrhoids   . Left hemiplegia ([place])   . NSTEMI (non-ST elevated myocardial infarction) ([place])    NSTEMI- last cath 06/2011-Stent to LCX-DES, last nuc 07/30/11 low risk  . Obesity (BMI 30-39.9)   . OSA on CPAP    since July 2013- uses CPAP sometimes , states he has lost 147 lbs. since stroke & doesn't use the CPAP as much as he use to.   . Plantar fasciitis of left foot   . Pneumonia    hosp. 12/2014  . Pulmonary embolism ([place]) 03/2014  . Stroke ([place]) 01/2014   "no control of LUE, can slightly LLE; memory problems since" (11/11/2014)  .  Tubular adenoma of colon     PAST SURGICAL HISTORY: Past Surgical History:  Procedure Laterality Date  . BASAL CELL CARCINOMA EXCISION Left 2015   ankle  . CARDIAC SURGERY    . CATARACT EXTRACTION Bilateral   . COLONOSCOPY N/A 02/10/2013   Procedure: COLONOSCOPY;  Surgeon: Ladene Artist, MD;  Location: [facility];  Service: Endoscopy;  Laterality: N/A;    . CORONARY ANGIOPLASTY WITH STENT PLACEMENT  07/01/2011   DES-Resolute to native LCX  . CORONARY ANGIOPLASTY WITH STENT PLACEMENT  12/01/2007   COMPLEX 5 LESION PCI INCLUDING CUTTING BALLOON AND 4 CYPHER DESs  . CORONARY ARTERY BYPASS GRAFT  07/20/2010    LIMA-LAD; VG-ACUTE MARG of RCA; Seq VG-distal RCA & then pda  . EP IMPLANTABLE DEVICE N/A 02/14/2016   Procedure: Loop Recorder Removal;  Surgeon: Thompson Grayer, MD;  Location: [place] [facility];  Service: Cardiovascular;  Laterality: N/A;  . ESOPHAGOGASTRODUODENOSCOPY  01/2014   gastric ulcer, erosive gastroduodenitis, H Pylori negative.   . ESOPHAGOGASTRODUODENOSCOPY (EGD) WITH PROPOFOL N/A 11/14/2014   Procedure: ESOPHAGOGASTRODUODENOSCOPY (EGD) WITH PROPOFOL;  Surgeon: Milus Banister, MD;  Location: [facility];  Service: Endoscopy;  Laterality: N/A;  . HAND SURGERY Right    to take glass out  . HERNIA REPAIR    . INTRAMEDULLARY (IM) NAIL INTERTROCHANTERIC Right 02/07/2015   Procedure: INTRAMEDULLARY (IM) NAIL INTERTROCHANTRIC RIGHT HIP;  Surgeon: Renette Butters, MD;  Location: [facility];  Service: Orthopedics;  Laterality: Right;  . KNEE ARTHROSCOPY Right   . LEFT HEART CATHETERIZATION WITH CORONARY/GRAFT ANGIOGRAM  07/01/2011   Procedure: LEFT HEART CATHETERIZATION WITH Beatrix Fetters;  Surgeon: Sanda Klein, MD;  Location: [facility];  Service: Cardiovascular;;  . LOOP RECORDER IMPLANT  02/15/2014   MDT LINQ implanted by Dr Rayann Heman for cryptogenic stroke  . PERCUTANEOUS CORONARY STENT INTERVENTION (PCI-S) Right 07/01/2011   Procedure: PERCUTANEOUS CORONARY STENT INTERVENTION (PCI-S);  Surgeon: Sanda Klein, MD;  Location: [facility];  Service: Cardiovascular;  Laterality: Right;  . PICC LINE PLACE PERIPHERAL ([place] HX)  06/2015  . RETINAL DETACHMENT SURGERY Right   . TEE WITHOUT CARDIOVERSION N/A 02/15/2014   Procedure: TRANSESOPHAGEAL ECHOCARDIOGRAM (TEE);  Surgeon: Josue Hector, MD;  Location: [place];  Service:  Cardiovascular;  Laterality: N/A;  . UMBILICAL HERNIA REPAIR  2006    FAMILY HISTORY: Family History  Problem Relation Age of Onset  . Diabetes type II Mother   . CAD Father 90       CABG, PCI, died at 65  . Heart attack Father 74  . Hypertension Brother   . Diabetes Brother   . Hyperlipidemia Brother   . Brain cancer Maternal Grandmother   . COPD Paternal Grandfather   . Coronary artery disease Unknown   . Other Unknown        denies family history of DVT/PE  . Colon cancer Neg Hx   . Stroke Neg Hx   . Prostate cancer Neg Hx     SOCIAL HISTORY:  Social History   Socioeconomic History  . Marital status: Married    Spouse name: Not on file  . Number of children: 5  . Years of education: Not on file  . Highest education level: Not on file  Social Needs  . Financial resource strain: Not on file  . Food insecurity - worry: Not on file  . Food insecurity - inability: Not on file  . Transportation needs - medical: Not on file  . Transportation needs -  non-medical: Not on file  Occupational History    Employer: [employer]    Comment: works at the [employer]  . Smoking status: Former Smoker    Packs/day: 2.00    Years: 4.00    Pack years: 8.00    Types: Cigarettes    Last attempt to quit: 01/30/1969    Years since quitting: 48.1  . Smokeless tobacco: Never Used  . Tobacco comment: "quit smoking cigarettes  in the 1970's"  Substance and Sexual Activity  . Alcohol use: No    Alcohol/week: 0.0 oz  . Drug use: No    Comment: "smoked some pot in college"  . Sexual activity: Not Currently  Other Topics Concern  . Not on file  Social History Narrative   Quit smoking 40 years ago   Married 1996   Likes to play guitar and sing with church group unable since 02/12/14 stroke    [employer] 1970-1972, [place], no service related issues.       PHYSICAL EXAM  Vitals:   03/06/17 1452  BP: 128/75  Pulse: 67  Resp: 18  Weight: 203 lb (92.1 kg)  Height: 6'  (1.829 m)    Body mass index is 27.53 kg/m.   General: The patient is well-developed and well-nourished and in no acute distress.  He is in a wheelchair.  Neck:  There are no carotid bruits. The neck is mildly tender in the lower cervical paraspinal region and the trapezius muscles..     Skin: He has mild edema at the left ankle.  Neurologic Exam  Mental status: The patient is alert and oriented x 3 at the time of the examination. The patient has apparent normal recent and remote memory. Attention is mildly reduced.   Speech is normal.   There is no neglect.  Cranial nerves: Extraocular movements are full. Pupils are equal and reactive to light and accommodation. Saccadic pursuit is reduced when looking to the left..  Patient strength and sensation is fairly symmetric.  Trapezius and sternocleidomastoid strength is reduced on the left.. No dysarthria is noted.  No obvious hearing deficits are noted.  Motor:  Muscle bulk is normal. Muscle tone is increased on the left.. The arm is held in a flexed position and he has severe spasticity proximal muscles..  Strength is 5/5 on the right. Strength is 2/5 in left shoulder elevation and 1/5 in proximal arm strength. Strength is 2/5 in the quadriceps and 2-/5 in other proximal muscles of the left leg  Sensory: Sensory testing is intact to touch and vibration duration sensation    Coordination: Cerebellar testing reveals good finger-nose-finger and heel-to-shin on the right and he cannot do these tasks on the left..  Gait and station: Station requires unilat support. He needs strong support to walk    Reflexes: Deep tendon reflexes are increased on the left.Marland Kitchen      DIAGNOSTIC DATA (LABS, IMAGING, TESTING) - I reviewed patient records, labs, notes, testing and imaging myself where available.  Lab Results  Component Value Date   WBC 4.7 11/26/2016   HGB 12.0 (L) 11/26/2016   HCT 37.5 (L) 11/26/2016   MCV 85.0 11/26/2016   PLT 144.0 (L)  11/26/2016      Component Value Date/Time   NA 141 08/30/2016 1508   K 3.7 08/30/2016 1508   CL 107 08/30/2016 1508   CO2 27 08/30/2016 1508   GLUCOSE 95 08/30/2016 1508   BUN 18 08/30/2016 1508   CREATININE 0.94 08/30/2016  1508   CREATININE 0.99 08/11/2015 1528   CALCIUM 9.1 08/30/2016 1508   PROT 6.5 11/26/2016 1538   ALBUMIN 3.6 11/26/2016 1538   AST 20 11/26/2016 1538   ALT 11 11/26/2016 1538   ALKPHOS 49 11/26/2016 1538   BILITOT 0.3 11/26/2016 1538   GFRNONAA >60 03/19/2016 0751   GFRNONAA >89 09/13/2014 1032   GFRAA >60 03/19/2016 0751   GFRAA >89 09/13/2014 1032   Lab Results  Component Value Date   CHOL 113 08/30/2016   HDL 48.10 08/30/2016   LDLCALC 57 08/30/2016   TRIG 40.0 08/30/2016   CHOLHDL 2 08/30/2016   Lab Results  Component Value Date   HGBA1C 5.6 02/13/2014   Lab Results  Component Value Date   VITAMINB12 286 07/05/2015   Lab Results  Component Value Date   TSH 1.035 12/23/2014        ASSESSMENT AND PLAN  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    Continue Eliquis for anticoagulation.. 2.   Use 4 prong cane around the house.  3.    Continue Zoloft for mood. Renew hydrocodone (he usually just takes 1 a week on average) 4.    Continue regular use of CPAP.  5.      He will return to see me in 5-6 months or sooner if he has new or worsening neurologic symptoms.   Richard A. Felecia Shelling, MD, PhD 47/82/9562, 1:30 PM Certified in Neurology, Clinical Neurophysiology, Sleep Medicine, Pain Medicine and Neuroimaging  [facility] [address], [place], [place] [address] [phone]

## 2017-03-24 ENCOUNTER — Ambulatory Visit: Payer: Self-pay

## 2017-03-24 NOTE — Telephone Encounter (Signed)
Pt. called to report passing bright red blood with urination.  Reported his urine was orange, and he noticed a bright red streak of blood on side of toilet bowl, and a few drips of BRB from tip of penis, and also drips on the floor.  Denies any prior episodes of BRB in urine.  Denied any pain or burning with urination. Does admit to urgency with urination.  Reported he hasn't noted passing any blood clots.  Denied any fever, back pain or abdominal pain.  Per protocol, appt. Scheduled tomorrow.  Care advice per protocol.  Verb. Understanding.  Agrees with plan.        Reason for Disposition . Blood in urine  (Exception: could be normal menstrual bleeding)  Answer Assessment - Initial Assessment Questions 1. COLOR of URINE: "Describe the color of the urine."  (e.g., tea-colored, pink, red, blood clots, bloody)     orange urine and bright red stream on side of toilet bowl  2. ONSET: "When did the bleeding start?"     About one hour ago.  3. EPISODES: "How many times has there been blood in the urine?" or "How many times today?"     One time 4. PAIN with URINATION: "Is there any pain with passing your urine?" If so, ask: "How bad is the pain?"  (Scale 1-10; or mild, moderate, severe)    - MILD - complains slightly about urination hurting    - MODERATE - interferes with normal activities      - SEVERE - excruciating, unwilling or unable to urinate because of the pain      No  5. FEVER: "Do you have a fever?" If so, ask: "What is your temperature, how was it measured, and when did it start?"     No fever or chills 6. ASSOCIATED SYMPTOMS: "Are you passing urine more frequently than usual?"    No burning.  Had an urgency to urinate. Wiped BRB from tip of penis and a couple drops on floor.   7. OTHER SYMPTOMS: "Do you have any other symptoms?" (e.g., back/flank pain, abdominal pain, vomiting)     Denies back or abdominal pain.  Nausea a few days ago  8. PREGNANCY: "Is there any chance you are  pregnant?" "When was your last menstrual period?"    n/a  Protocols used: URINE - BLOOD IN-A-AH

## 2017-03-25 ENCOUNTER — Ambulatory Visit (INDEPENDENT_AMBULATORY_CARE_PROVIDER_SITE_OTHER): Payer: Medicare Other | Admitting: Internal Medicine

## 2017-03-25 ENCOUNTER — Encounter: Payer: Self-pay | Admitting: Internal Medicine

## 2017-03-25 VITALS — BP 126/78 | HR 61 | Temp 98.0°F | Wt 226.0 lb

## 2017-03-25 DIAGNOSIS — R3915 Urgency of urination: Secondary | ICD-10-CM | POA: Diagnosis not present

## 2017-03-25 DIAGNOSIS — R310 Gross hematuria: Secondary | ICD-10-CM

## 2017-03-25 LAB — POC URINALSYSI DIPSTICK (AUTOMATED)
Bilirubin, UA: NEGATIVE
Glucose, UA: NEGATIVE
Ketones, UA: NEGATIVE
Leukocytes, UA: NEGATIVE
Nitrite, UA: NEGATIVE
Protein, UA: NEGATIVE
Spec Grav, UA: >=1.03 — AB (ref 1.010–1.025)
Urobilinogen, UA: 0.2 E.U./dL
pH, UA: 6 (ref 5.0–8.0)

## 2017-03-25 NOTE — Progress Notes (Signed)
Subjective:    Patient ID: Gregory Wiley, male    DOB: [birth year], 67 y.o.   MRN: [id number]  HPI  Pt presents to the clinic today with c/o urinary urgency and hematuria. He reports this occured yesterday. He denies urinary frequency, dysuria, bladder pressure, fever, chills, nausea or low back pain. He has not tried anything OTC for his symptoms. He has a history of anemia, is on Eliquis. He is a former smoker.  Review of Systems  Past Medical History:  Diagnosis Date  . Adrenal insufficiency ([place])   . Anxiety   . Basal cell carcinoma of skin of left ankle   . Bleeding stomach ulcer 2015  . Coronary artery disease    stent, then CABG 07/20/10  . Daily headache    "for the past month" (11/11/2014)  . Depression   . Diverticulosis   . GERD (gastroesophageal reflux disease)   . Gout   . H/O cardiomyopathy    ischemic, now last echo 07/30/11, EF 38%W  . History of hiatal hernia   . Hyperlipidemia   . Hypertension   . Internal hemorrhoids   . Left hemiplegia ([place])   . NSTEMI (non-ST elevated myocardial infarction) ([place])    NSTEMI- last cath 06/2011-Stent to LCX-DES, last nuc 07/30/11 low risk  . Obesity (BMI 30-39.9)   . OSA on CPAP    since July 2013- uses CPAP sometimes , states he has lost 147 lbs. since stroke & doesn't use the CPAP as much as he use to.   . Plantar fasciitis of left foot   . Pneumonia    hosp. 12/2014  . Pulmonary embolism ([place]) 03/2014  . Stroke ([place]) 01/2014   "no control of LUE, can slightly LLE; memory problems since" (11/11/2014)  . Tubular adenoma of colon     Current Outpatient Medications  Medication Sig Dispense Refill  . acetaminophen (TYLENOL) 325 MG tablet Take 1 tablet (325 mg total) by mouth every 6 (six) hours as needed for mild pain.    Marland Kitchen apixaban (ELIQUIS) 5 MG TABS tablet Take 1 tablet (5 mg total) by mouth 2 (two) times daily. 180 tablet 3  . aspirin 81 MG EC tablet Take 1 tablet (81 mg total) by mouth daily. Swallow whole. 90 tablet 3    . cholecalciferol (VITAMIN D) 1000 units tablet Take 1,000 Units by mouth daily.    Marland Kitchen doxepin (SINEQUAN) 10 MG capsule Take 1 capsule (10 mg total) by mouth at bedtime. 30 capsule 11  . fludrocortisone (FLORINEF) 0.1 MG tablet Take 1 tablet (100 mcg total) by mouth 2 (two) times daily. 60 tablet 3  . fluticasone (FLONASE) 50 MCG/ACT nasal spray Place 2 sprays into both nostrils daily.    Marland Kitchen HYDROcodone-acetaminophen (NORCO/VICODIN) 5-325 MG tablet Take 1 tablet by mouth daily as needed for moderate pain. 30 tablet 0  . HYDROcodone-homatropine (HYCODAN) 5-1.5 MG/5ML syrup Take 5 mLs by mouth every 8 (eight) hours as needed for cough. 120 mL 0  . Melatonin 10 MG CAPS Take 10 mg by mouth at bedtime as needed (sleep). 90 capsule 3  . midodrine (PROAMATINE) 2.5 MG tablet TAKE 1 TABLET BY MOUTH 2 TIMES DAILY WITH MEALS. 60 tablet 3  . nitroGLYCERIN (NITROSTAT) 0.4 MG SL tablet Place 1 tablet (0.4 mg total) under the tongue every 5 (five) minutes as needed. For chest pain (Patient taking differently: Place 0.4 mg under the tongue every 5 (five) minutes as needed for chest pain. ) 25 tablet 12  .  pantoprazole (PROTONIX) 40 MG tablet TAKE 1 TABLET BY MOUTH 2 TIMES DAILY. 60 tablet 6  . potassium chloride (K-DUR) 10 MEQ tablet TAKE 2 TABLETS BY MOUTH 3 TIMES DAILY. 180 tablet 6  . PRESCRIPTION MEDICATION Inhale into the lungs at bedtime. CPAP    . prochlorperazine (COMPAZINE) 10 MG tablet Take 1 tablet (10 mg total) by mouth every 8 (eight) hours as needed (headache, nausea or vomiting). 10 tablet 0  . rosuvastatin (CRESTOR) 40 MG tablet Take 1 tablet (40 mg total) by mouth daily. 90 tablet 3  . senna-docusate (SENNA S) 8.6-50 MG tablet Take 1 tablet by mouth 2 (two) times daily. 60 tablet prn  . sertraline (ZOLOFT) 100 MG tablet Take 1 tablet (100 mg total) by mouth daily. 30 tablet 11  . tiZANidine (ZANAFLEX) 4 MG capsule Take 1 capsule (4 mg total) by mouth as needed. 90 capsule 3   No current  facility-administered medications for this visit.     Allergies  Allergen Reactions  . Diphenhydramine Hcl Anaphylaxis  . Adhesive [Tape] Other (See Comments)    Tears skin - please use paper tape  . Proscar [Finasteride] Other (See Comments)    Lightheaded.   . Tamsulosin Other (See Comments)    Dizzy, vomiting.     Family History  Problem Relation Age of Onset  . Diabetes type II Mother   . CAD Father 28       CABG, PCI, died at 68  . Heart attack Father 34  . Hypertension Brother   . Diabetes Brother   . Hyperlipidemia Brother   . Brain cancer Maternal Grandmother   . COPD Paternal Grandfather   . Coronary artery disease Unknown   . Other Unknown        denies family history of DVT/PE  . Colon cancer Neg Hx   . Stroke Neg Hx   . Prostate cancer Neg Hx     Social History   Socioeconomic History  . Marital status: Married    Spouse name: Not on file  . Number of children: 5  . Years of education: Not on file  . Highest education level: Not on file  Social Needs  . Financial resource strain: Not on file  . Food insecurity - worry: Not on file  . Food insecurity - inability: Not on file  . Transportation needs - medical: Not on file  . Transportation needs - non-medical: Not on file  Occupational History    Employer: [employer]    Comment: works at the [employer]  . Smoking status: Former Smoker    Packs/day: 2.00    Years: 4.00    Pack years: 8.00    Types: Cigarettes    Last attempt to quit: 01/30/1969    Years since quitting: 48.1  . Smokeless tobacco: Never Used  . Tobacco comment: "quit smoking cigarettes  in the 1970's"  Substance and Sexual Activity  . Alcohol use: No    Alcohol/week: 0.0 oz  . Drug use: No    Comment: "smoked some pot in college"  . Sexual activity: Not Currently  Other Topics Concern  . 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       Constitutional: Denies fever, malaise, fatigue, headache or abrupt weight  changes.   Gastrointestinal: Denies abdominal pain, bloating, constipation, diarrhea or blood in the stool.  GU: Pt reports urinary urgency and blood in urine. Denies frequency, pain with urination, burning sensation, odor or discharge.   No other specific complaints in a complete review of systems (except as listed in HPI above).     Objective:   Physical Exam  BP 126/78   Pulse 61   Temp 98 F (36.7 C) (Oral)   Wt 226 lb (102.5 kg)   SpO2 98%   BMI 30.65 kg/m   Wt Readings from Last 3 Encounters:  03/06/17 203 lb (92.1 kg)  02/04/17 197 lb (89.4 kg)  12/14/16 194 lb (88 kg)    General: Appears his stated age, chronically ill appearing, in NAD. Abdomen: Soft and nontender. Normal bowel sounds. No distention or masses noted.  GU: Penis with lesion or ulceration. No blood noticeable at the meatus.  BMET    Component Value Date/Time   NA 141 08/30/2016 1508   K 3.7 08/30/2016 1508   CL 107 08/30/2016 1508   CO2 27 08/30/2016 1508   GLUCOSE 95 08/30/2016 1508   BUN 18 08/30/2016 1508   CREATININE 0.94 08/30/2016 1508   CREATININE 0.99 08/11/2015 1528   CALCIUM 9.1 08/30/2016 1508   GFRNONAA >60 03/19/2016 0751   GFRNONAA >89 09/13/2014 1032   GFRAA >60 03/19/2016 0751   GFRAA >89 09/13/2014 1032    Lipid Panel     Component Value Date/Time   CHOL 113 08/30/2016 1508   CHOL 199 01/04/2013 1206   TRIG 40.0 08/30/2016 1508   TRIG 113 01/04/2013 1206   HDL 48.10 08/30/2016 1508   HDL 47 01/04/2013 1206   CHOLHDL 2 08/30/2016 1508   VLDL 8.0 08/30/2016 1508   LDLCALC 57 08/30/2016 1508   LDLCALC 129 (H) 01/04/2013 1206    CBC    Component Value Date/Time   WBC 4.7 11/26/2016 1538   RBC 4.41 11/26/2016 1538   HGB 12.0 (L) 11/26/2016 1538   HCT 37.5 (L) 11/26/2016 1538   PLT  144.0 (L) 11/26/2016 1538   MCV 85.0 11/26/2016 1538   MCH 25.5 (L) 10/04/2016 1611   MCHC 31.9 11/26/2016 1538   RDW 18.5 (H) 11/26/2016 1538   LYMPHSABS 1.0 11/26/2016 1538   MONOABS 0.3 11/26/2016 1538   EOSABS 0.4 11/26/2016 1538   BASOSABS 0.1 11/26/2016 1538    Hgb A1C Lab Results  Component Value Date   HGBA1C 5.6 02/13/2014           Assessment & Plan:   Hematuria, Urinary Urgency:  Urinalysis: trace blood Will send urine culture CBC and CMET today Likely due to anticoagulation but can not rule out malignancy, will refer to urology (per Dr. Damita Dunnings recommendation) Discussed red flags, none today  Will follow up after labs, return precautions discussed Webb Silversmith, NP

## 2017-03-25 NOTE — Patient Instructions (Signed)

## 2017-03-26 ENCOUNTER — Other Ambulatory Visit: Payer: Self-pay | Admitting: Family Medicine

## 2017-03-26 LAB — COMPREHENSIVE METABOLIC PANEL
ALT: 8 U/L (ref 0–53)
AST: 15 U/L (ref 0–37)
Albumin: 3.3 g/dL — ABNORMAL LOW (ref 3.5–5.2)
Alkaline Phosphatase: 51 U/L (ref 39–117)
BUN: 15 mg/dL (ref 6–23)
CO2: 27 mEq/L (ref 19–32)
Calcium: 8.1 mg/dL — ABNORMAL LOW (ref 8.4–10.5)
Chloride: 108 mEq/L (ref 96–112)
Creatinine, Ser: 0.84 mg/dL (ref 0.40–1.50)
GFR: 96.79 mL/min (ref >60.00)
Glucose, Bld: 90 mg/dL (ref 70–99)
Potassium: 3.7 mEq/L (ref 3.5–5.1)
Sodium: 141 mEq/L (ref 135–145)
Total Bilirubin: 0.4 mg/dL (ref 0.2–1.2)
Total Protein: 6.3 g/dL (ref 6.0–8.3)

## 2017-03-26 LAB — CBC
HCT: 29.2 % — ABNORMAL LOW (ref 39.0–52.0)
Hemoglobin: 9 g/dL — ABNORMAL LOW (ref 13.0–17.0)
MCHC: 30.8 g/dL (ref 30.0–36.0)
MCV: 81.3 fl (ref 78.0–100.0)
Platelets: 127 10*3/uL — ABNORMAL LOW (ref 150.0–400.0)
RBC: 3.59 Mil/uL — ABNORMAL LOW (ref 4.22–5.81)
RDW: 15.4 % (ref 11.5–15.5)
WBC: 4.3 10*3/uL (ref 4.0–10.5)

## 2017-03-26 MED ORDER — FERROUS SULFATE 325 (65 FE) MG PO TBEC: 325.0000 mg | DELAYED_RELEASE_TABLET | Freq: Every day | ORAL | 3 refills | Status: DC

## 2017-03-26 NOTE — Progress Notes (Signed)
Patient's wife notified as instructed by telephone and verbalized understanding. Follow-up lab appointment schedule as instructed.

## 2017-03-26 NOTE — Progress Notes (Signed)
Notify pt.  Hgb lower, would restart iron.  rx sent.  Would recheck CBC and iron labs (orders in the EMR) in about 2 weeks (either here or at [facility]), when possible.  Would still have patient see uro.  If more bleeding in the meantime then let us know.   Thanks.  Routed to [place] as FYI, with appreciation.

## 2017-03-27 LAB — URINE CULTURE
MICRO NUMBER:: 90028989
Result:: NO GROWTH
SPECIMEN QUALITY:: ADEQUATE

## 2017-04-09 ENCOUNTER — Other Ambulatory Visit (INDEPENDENT_AMBULATORY_CARE_PROVIDER_SITE_OTHER): Payer: Medicare Other

## 2017-04-09 DIAGNOSIS — Z862 Personal history of diseases of the blood and blood-forming organs and certain disorders involving the immune mechanism: Secondary | ICD-10-CM | POA: Diagnosis not present

## 2017-04-09 LAB — CBC WITH DIFFERENTIAL/PLATELET
Basophils Absolute: 0 10*3/uL (ref 0.0–0.1)
Basophils Relative: 0.9 % (ref 0.0–3.0)
Eosinophils Absolute: 0.3 10*3/uL (ref 0.0–0.7)
Eosinophils Relative: 5.6 % — ABNORMAL HIGH (ref 0.0–5.0)
HCT: 30.9 % — ABNORMAL LOW (ref 39.0–52.0)
Hemoglobin: 9.6 g/dL — ABNORMAL LOW (ref 13.0–17.0)
Lymphocytes Relative: 15.6 % (ref 12.0–46.0)
Lymphs Abs: 0.7 10*3/uL (ref 0.7–4.0)
MCHC: 31 g/dL (ref 30.0–36.0)
MCV: 80 fl (ref 78.0–100.0)
Monocytes Absolute: 0.4 10*3/uL (ref 0.1–1.0)
Monocytes Relative: 8.2 % (ref 3.0–12.0)
Neutro Abs: 3.2 10*3/uL (ref 1.4–7.7)
Neutrophils Relative %: 69.7 % (ref 43.0–77.0)
Platelets: 172 10*3/uL (ref 150.0–400.0)
RBC: 3.85 Mil/uL — ABNORMAL LOW (ref 4.22–5.81)
RDW: 18 % — ABNORMAL HIGH (ref 11.5–15.5)
WBC: 4.5 10*3/uL (ref 4.0–10.5)

## 2017-04-10 LAB — COMPREHENSIVE METABOLIC PANEL
ALT: 7 U/L (ref 0–53)
AST: 12 U/L (ref 0–37)
Albumin: 3.2 g/dL — ABNORMAL LOW (ref 3.5–5.2)
Alkaline Phosphatase: 49 U/L (ref 39–117)
BUN: 14 mg/dL (ref 6–23)
CO2: 28 mEq/L (ref 19–32)
Calcium: 8.3 mg/dL — ABNORMAL LOW (ref 8.4–10.5)
Chloride: 107 mEq/L (ref 96–112)
Creatinine, Ser: 0.91 mg/dL (ref 0.40–1.50)
GFR: 88.24 mL/min (ref >60.00)
Glucose, Bld: 95 mg/dL (ref 70–99)
Potassium: 3.8 mEq/L (ref 3.5–5.1)
Sodium: 141 mEq/L (ref 135–145)
Total Bilirubin: 0.5 mg/dL (ref 0.2–1.2)
Total Protein: 6.5 g/dL (ref 6.0–8.3)

## 2017-04-10 LAB — IBC PANEL
Iron: 20 ug/dL — ABNORMAL LOW (ref 42–165)
Saturation Ratios: 5.7 % — ABNORMAL LOW (ref 20.0–50.0)
Transferrin: 252 mg/dL (ref 212.0–360.0)

## 2017-04-11 ENCOUNTER — Telehealth: Payer: Self-pay | Admitting: Neurology

## 2017-04-11 MED ORDER — GABAPENTIN 300 MG PO CAPS: 300.0000 mg | ORAL_CAPSULE | Freq: Every day | ORAL | 1 refills | Status: DC

## 2017-04-11 NOTE — Telephone Encounter (Signed)
Pts wife is calling requesting a refill for gabapentin (NEURONTIN) 300 MG capsule be sent in to [place], [place]

## 2017-04-11 NOTE — Telephone Encounter (Signed)
A prescription was written for low-dose gabapentin.

## 2017-04-11 NOTE — Telephone Encounter (Signed)
Called and spoke with wife, [name] (on [place]). States pt takes as needed for numbness and tingling located in left hand and left hip/leg. Advised I do not see where Dr. Felecia Shelling has prescribed this in the past. She believes he received prescription about a year ago or more from someone else.   Takes 300mg  capsule prn for numbness/tingling. Has 5 capsules left.  Ok to wait until next week for Dr. Felecia Shelling to address if WID more comfortable with that. Advised I will send to Dr. Jannifer Franklin to review.

## 2017-04-15 ENCOUNTER — Other Ambulatory Visit: Payer: Self-pay | Admitting: Family Medicine

## 2017-04-15 DIAGNOSIS — D649 Anemia, unspecified: Secondary | ICD-10-CM

## 2017-04-17 ENCOUNTER — Other Ambulatory Visit: Payer: Self-pay | Admitting: Cardiovascular Disease

## 2017-04-17 ENCOUNTER — Other Ambulatory Visit: Payer: Self-pay | Admitting: Family Medicine

## 2017-04-17 ENCOUNTER — Other Ambulatory Visit: Payer: Self-pay | Admitting: Neurology

## 2017-04-17 NOTE — Telephone Encounter (Signed)
Sent. Thanks.   

## 2017-04-17 NOTE — Telephone Encounter (Signed)
Electronic refill request. Midodrine Last office visit:   11/26/2016 CPE Last Filled:    60 tablet 3 11/28/2016  Please advise.

## 2017-05-06 ENCOUNTER — Emergency Department (HOSPITAL_COMMUNITY)
Admission: EM | Admit: 2017-05-06 | Discharge: 2017-05-07 | Disposition: A | Discharge status: Home / Self Care | Payer: Medicare Other | Attending: Emergency Medicine | Admitting: Emergency Medicine

## 2017-05-06 ENCOUNTER — Encounter: Payer: Self-pay | Admitting: Family Medicine

## 2017-05-06 ENCOUNTER — Encounter (HOSPITAL_COMMUNITY): Payer: Self-pay

## 2017-05-06 ENCOUNTER — Emergency Department (HOSPITAL_COMMUNITY): Payer: Medicare Other

## 2017-05-06 ENCOUNTER — Ambulatory Visit (INDEPENDENT_AMBULATORY_CARE_PROVIDER_SITE_OTHER): Payer: Medicare Other | Admitting: Family Medicine

## 2017-05-06 DIAGNOSIS — Y92009 Unspecified place in unspecified non-institutional (private) residence as the place of occurrence of the external cause: Secondary | ICD-10-CM | POA: Insufficient documentation

## 2017-05-06 DIAGNOSIS — W19XXXA Unspecified fall, initial encounter: Secondary | ICD-10-CM | POA: Diagnosis not present

## 2017-05-06 DIAGNOSIS — I252 Old myocardial infarction: Secondary | ICD-10-CM | POA: Diagnosis not present

## 2017-05-06 DIAGNOSIS — Y92018 Other place in single-family (private) house as the place of occurrence of the external cause: Secondary | ICD-10-CM | POA: Diagnosis not present

## 2017-05-06 DIAGNOSIS — I69352 Hemiplegia and hemiparesis following cerebral infarction affecting left dominant side: Secondary | ICD-10-CM | POA: Diagnosis not present

## 2017-05-06 DIAGNOSIS — S73192A Other sprain of left hip, initial encounter: Secondary | ICD-10-CM | POA: Diagnosis not present

## 2017-05-06 DIAGNOSIS — Y9389 Activity, other specified: Secondary | ICD-10-CM | POA: Diagnosis not present

## 2017-05-06 DIAGNOSIS — W1839XA Other fall on same level, initial encounter: Secondary | ICD-10-CM | POA: Insufficient documentation

## 2017-05-06 DIAGNOSIS — S4992XA Unspecified injury of left shoulder and upper arm, initial encounter: Secondary | ICD-10-CM | POA: Diagnosis not present

## 2017-05-06 DIAGNOSIS — Z7901 Long term (current) use of anticoagulants: Secondary | ICD-10-CM | POA: Diagnosis not present

## 2017-05-06 DIAGNOSIS — R55 Syncope and collapse: Secondary | ICD-10-CM | POA: Diagnosis present

## 2017-05-06 DIAGNOSIS — Z79899 Other long term (current) drug therapy: Secondary | ICD-10-CM | POA: Diagnosis not present

## 2017-05-06 DIAGNOSIS — S060X1A Concussion with loss of consciousness of 30 minutes or less, initial encounter: Secondary | ICD-10-CM | POA: Diagnosis not present

## 2017-05-06 DIAGNOSIS — Z87891 Personal history of nicotine dependence: Secondary | ICD-10-CM | POA: Insufficient documentation

## 2017-05-06 DIAGNOSIS — S43402A Unspecified sprain of left shoulder joint, initial encounter: Secondary | ICD-10-CM

## 2017-05-06 DIAGNOSIS — Z7982 Long term (current) use of aspirin: Secondary | ICD-10-CM | POA: Diagnosis not present

## 2017-05-06 DIAGNOSIS — S79912A Unspecified injury of left hip, initial encounter: Secondary | ICD-10-CM

## 2017-05-06 DIAGNOSIS — S73102A Unspecified sprain of left hip, initial encounter: Secondary | ICD-10-CM

## 2017-05-06 DIAGNOSIS — Y999 Unspecified external cause status: Secondary | ICD-10-CM | POA: Diagnosis not present

## 2017-05-06 DIAGNOSIS — S43492A Other sprain of left shoulder joint, initial encounter: Secondary | ICD-10-CM | POA: Diagnosis not present

## 2017-05-06 LAB — CBC
HCT: 37.5 % — ABNORMAL LOW (ref 39.0–52.0)
Hemoglobin: 11.3 g/dL — ABNORMAL LOW (ref 13.0–17.0)
MCH: 24.6 pg — ABNORMAL LOW (ref 26.0–34.0)
MCHC: 30.1 g/dL (ref 30.0–36.0)
MCV: 81.5 fL (ref 78.0–100.0)
Platelets: 229 10*3/uL (ref 150–400)
RBC: 4.6 MIL/uL (ref 4.22–5.81)
RDW: 17 % — ABNORMAL HIGH (ref 11.5–15.5)
WBC: 6.4 10*3/uL (ref 4.0–10.5)

## 2017-05-06 LAB — BASIC METABOLIC PANEL
Anion gap: 10 (ref 5–15)
BUN: 10 mg/dL (ref 6–20)
CO2: 25 mmol/L (ref 22–32)
Calcium: 8.5 mg/dL — ABNORMAL LOW (ref 8.9–10.3)
Chloride: 105 mmol/L (ref 101–111)
Creatinine, Ser: 1 mg/dL (ref 0.61–1.24)
GFR calc Af Amer: >60 mL/min (ref >60)
GFR calc non Af Amer: >60 mL/min (ref >60)
Glucose, Bld: 89 mg/dL (ref 65–99)
Potassium: 3.7 mmol/L (ref 3.5–5.1)
Sodium: 140 mmol/L (ref 135–145)

## 2017-05-06 LAB — URINALYSIS, ROUTINE W REFLEX MICROSCOPIC
Bilirubin Urine: NEGATIVE
Glucose, UA: NEGATIVE mg/dL
Hgb urine dipstick: NEGATIVE
Ketones, ur: 5 mg/dL — AB
Leukocytes, UA: NEGATIVE
Nitrite: NEGATIVE
Protein, ur: NEGATIVE mg/dL
Specific Gravity, Urine: 1.021 (ref 1.005–1.030)
pH: 5 (ref 5.0–8.0)

## 2017-05-06 MED ORDER — POTASSIUM CHLORIDE ER 10 MEQ PO TBCR: EXTENDED_RELEASE_TABLET | ORAL | Status: DC

## 2017-05-06 NOTE — Assessment & Plan Note (Addendum)
Fall 2 days ago with L shoulder region and L hip pain, esp with shoulder and hip ROM.  We are not able to get patient up onto the xray table here.  Pt and wife agree.   Additionally, there is the issue re: potential need for CT head after the fall with anticoagulation.  We don't have CT here.   At this point, needs ER eval.  He and his wife agreed.  I helped get patient into the car and wife is driving him to [facility] ER and I called ahead to notify staff.   He declined EMS transport.   This is the best option I can think of for his care at his point.   I appreciate the help of all involved.  >25 minutes spent in face to face time with patient, >50% spent in counselling or coordination of care.

## 2017-05-06 NOTE — ED Triage Notes (Signed)
Per Pt, Pt is coming from home with complaints of fall that took place on Sunday. Pt was getting up to go to the bathroom when he lost his balance and started to fall. Pt does not remember the episode and just remember waking up to his wife cleaning him up. Pt had had a bowel movement on himself during the episode. Reports pain in the left hip, left shoulder, and neck. Abrasion noted to the back per family.

## 2017-05-06 NOTE — Progress Notes (Signed)
L hip pain.   L shoulder pain.  L side weaker at baseline from old CVA.  He got up at home to go to the BR but fell on 05/04/17.  He didn't recall all of the event.  He thought he fell and then had LOC with the fall.  Wife came in soon thereafter, within one minute and he was talking at that point.    Wife has noted that he has been slower to respond in conversation but that predates the fall.    He can't weight bear like he prev did- with some weight bearing his L knee "buckles".  He could stand on his own prior with assist but can't now.     Still on anticoagulation at baseline.   PMH and SH reviewed  ROS: Per HPI unless specifically indicated in ROS section   Meds, vitals, and allergies reviewed.   nad ncat In wheelchair at baseline Neck supple rrr ctab Abrasion on the L side of the back noted.  Fungal changes under the L breast.   L chest wall sore in midaxillary line, inferior to the axilla, esp with arm abduction, no local bruising noted.   L upper anterior chest wall ttp just medial to the L shoulder w/o bruising L leg not ttp initially on exam but pain with L hip flexion.  L side paralysis but sensation intact at baseline.

## 2017-05-06 NOTE — Patient Instructions (Signed)
Go to the ER.   You will likely need imaging that we can't do here.  Take care.  Glad to see you.

## 2017-05-07 ENCOUNTER — Telehealth: Payer: Self-pay | Admitting: *Deleted

## 2017-05-07 MED ORDER — HYDROCODONE-ACETAMINOPHEN 5-325 MG PO TABS
1.0000 | ORAL_TABLET | Freq: Once | ORAL | Status: AC
  Administered 2017-05-07: 1 via ORAL
  Filled 2017-05-07: qty 1

## 2017-05-07 NOTE — ED Provider Notes (Signed)
[facility] EMERGENCY DEPARTMENT Provider Note   CSN: [id number] Arrival date & time: 05/06/17  1800     History   Chief Complaint Chief Complaint  Patient presents with  . Loss of Consciousness    HPI Gregory Wiley is a 67 y.o. male.  The history is provided by the patient and the spouse.  Loss of Consciousness   This is a new problem. The current episode started 2 days ago. The problem has been gradually improving. He lost consciousness for a period of 1 to 5 minutes. The problem is associated with standing up. Associated symptoms include bowel incontinence and headaches. Pertinent negatives include back pain, chest pain, fever and vomiting. Treatments tried: rest. The treatment provided moderate relief.  patient with history of stroke.  Previous history of non-STEMI and previous history of PE.  He is on Eliquis. He presents after a fall about 2 days ago.  He reports he was trying to stand up when he began to fall.  He reports waking up after that and his wife was cleaning up a bowel movement that he had.  He has chronic left-sided hemiparesis, that does affect his ambulation. He is reporting headache, left shoulder pain and left hip pain.  Denies neck or back pain.  No new chest pain.  No new medications.  No other acute complaints Past Medical History:  Diagnosis Date  . Adrenal insufficiency ([place])   . Anxiety   . Basal cell carcinoma of skin of left ankle   . Bleeding stomach ulcer 2015  . Coronary artery disease    stent, then CABG 07/20/10  . Daily headache    "for the past month" (11/11/2014)  . Depression   . Diverticulosis   . GERD (gastroesophageal reflux disease)   . Gout   . H/O cardiomyopathy    ischemic, now last echo 07/30/11, EF 38%W  . History of hiatal hernia   . Hyperlipidemia   . Hypertension   . Internal hemorrhoids   . Left hemiplegia ([place])   . NSTEMI (non-ST elevated myocardial infarction) ([place])    NSTEMI- last cath 06/2011-Stent to  LCX-DES, last nuc 07/30/11 low risk  . Obesity (BMI 30-39.9)   . OSA on CPAP    since July 2013- uses CPAP sometimes , states he has lost 147 lbs. since stroke & doesn't use the CPAP as much as he use to.   . Plantar fasciitis of left foot   . Pneumonia    hosp. 12/2014  . Pulmonary embolism ([place]) 03/2014  . Stroke ([place]) 01/2014   "no control of LUE, can slightly LLE; memory problems since" (11/11/2014)  . Tubular adenoma of colon     Patient Active Problem List   Diagnosis Date Noted  . Fall at home 05/06/2017  . Advance care planning 11/28/2016  . Skin mass 11/28/2016  . Lower urinary tract symptoms (LUTS) 09/05/2016  . Hearing loss 09/05/2016  . Excessive daytime sleepiness 04/16/2016  . Exposure to TB 02/21/2016  . Vitamin D deficiency 02/21/2016  . Anemia 11/10/2015  . Left thigh pain 11/10/2015  . Abnormal x-ray 11/10/2015  . Neck pain 10/12/2015  . Hemiplegia affecting left side in left-dominant patient as late effect of cerebrovascular disease ([place]) 09/03/2015  . Medicare annual wellness visit, subsequent 08/14/2015  . Acute pulmonary embolism ([place]) 07/25/2015  . History of pulmonary embolism 07/15/2015  . Chronic anticoagulation 05/22/2015  . DNR (do not resuscitate) 01/19/2015  . Hypotension 01/14/2015  . Depression 12/14/2014  .  OSA (obstructive sleep apnea) 11/30/2014  . Chronic combined systolic and diastolic congestive heart failure ([place]) 11/11/2014  . Hemiplegia affecting left dominant side ([place]) 02/16/2014  . 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 06/30/2011    Past Surgical History:  Procedure Laterality Date  . BASAL CELL CARCINOMA EXCISION Left 2015   ankle  . CARDIAC SURGERY    . CATARACT EXTRACTION Bilateral   . COLONOSCOPY N/A 02/10/2013   Procedure: COLONOSCOPY;  Surgeon: Ladene Artist, MD;  Location: [facility];  Service: Endoscopy;  Laterality: N/A;  . CORONARY ANGIOPLASTY WITH STENT  PLACEMENT  07/01/2011   DES-Resolute to native LCX  . CORONARY ANGIOPLASTY WITH STENT PLACEMENT  12/01/2007   COMPLEX 5 LESION PCI INCLUDING CUTTING BALLOON AND 4 CYPHER DESs  . CORONARY ARTERY BYPASS GRAFT  07/20/2010    LIMA-LAD; VG-ACUTE MARG of RCA; Seq VG-distal RCA & then pda  . EP IMPLANTABLE DEVICE N/A 02/14/2016   Procedure: Loop Recorder Removal;  Surgeon: Thompson Grayer, MD;  Location: [facility];  Service: Cardiovascular;  Laterality: N/A;  . ESOPHAGOGASTRODUODENOSCOPY  01/2014   gastric ulcer, erosive gastroduodenitis, H Pylori negative.   . ESOPHAGOGASTRODUODENOSCOPY (EGD) WITH PROPOFOL N/A 11/14/2014   Procedure: ESOPHAGOGASTRODUODENOSCOPY (EGD) WITH PROPOFOL;  Surgeon: Milus Banister, MD;  Location: [facility];  Service: Endoscopy;  Laterality: N/A;  . HAND SURGERY Right    to take glass out  . HERNIA REPAIR    . INTRAMEDULLARY (IM) NAIL INTERTROCHANTERIC Right 02/07/2015   Procedure: INTRAMEDULLARY (IM) NAIL INTERTROCHANTRIC RIGHT HIP;  Surgeon: Renette Butters, MD;  Location: [facility];  Service: Orthopedics;  Laterality: Right;  . KNEE ARTHROSCOPY Right   . LEFT HEART CATHETERIZATION WITH CORONARY/GRAFT ANGIOGRAM  07/01/2011   Procedure: LEFT HEART CATHETERIZATION WITH Beatrix Fetters;  Surgeon: Sanda Klein, MD;  Location: [facility] CATH LAB;  Service: Cardiovascular;;  . LOOP RECORDER IMPLANT  02/15/2014   MDT LINQ implanted by Dr Rayann Heman for cryptogenic stroke  . PERCUTANEOUS CORONARY STENT INTERVENTION (PCI-S) Right 07/01/2011   Procedure: PERCUTANEOUS CORONARY STENT INTERVENTION (PCI-S);  Surgeon: Sanda Klein, MD;  Location: [facility] CATH LAB;  Service: Cardiovascular;  Laterality: Right;  . PICC LINE PLACE PERIPHERAL ([place] HX)  06/2015  . RETINAL DETACHMENT SURGERY Right   . TEE WITHOUT CARDIOVERSION N/A 02/15/2014   Procedure: TRANSESOPHAGEAL ECHOCARDIOGRAM (TEE);  Surgeon: Josue Hector, MD;  Location: [facility];  Service: Cardiovascular;  Laterality: N/A;  .  UMBILICAL HERNIA REPAIR  2006       Home Medications    Prior to Admission medications   Medication Sig Start Date End Date Taking? Authorizing Provider  acetaminophen (TYLENOL) 325 MG tablet Take 1 tablet (325 mg total) by mouth every 6 (six) hours as needed for mild pain. 12/27/14   Florencia Reasons, MD  apixaban (ELIQUIS) 5 MG TABS tablet Take 1 tablet (5 mg total) by mouth 2 (two) times daily. 12/04/16   [name], MD  aspirin 81 MG EC tablet Take 1 tablet (81 mg total) by mouth daily. Swallow whole. 12/04/16   [name], MD  cholecalciferol (VITAMIN D) 1000 units tablet Take 1,000 Units by mouth daily.    [provider]  doxepin (SINEQUAN) 10 MG capsule Take 1 capsule (10 mg total) by mouth at bedtime. 09/16/16   Sater, Nanine Means, MD  ferrous sulfate 325 (65 FE) MG EC tablet Take 1 tablet (325 mg total) by mouth daily  with breakfast. 03/26/17   Tonia Ghent, MD  fludrocortisone (FLORINEF) 0.1 MG tablet Take 1 tablet (100 mcg total) by mouth 2 (two) times daily. 11/28/16   Tonia Ghent, MD  fluticasone Asencion Islam) 50 MCG/ACT nasal spray Place 2 sprays into both nostrils daily. 12/11/16   Tonia Ghent, MD  gabapentin (NEURONTIN) 300 MG capsule Take 1 capsule (300 mg total) by mouth at bedtime. 04/11/17   Kathrynn Ducking, MD  HYDROcodone-acetaminophen (NORCO/VICODIN) 5-325 MG tablet Take 1 tablet by mouth daily as needed for moderate pain. 03/06/17   Sater, Nanine Means, MD  Melatonin 10 MG CAPS Take 10 mg by mouth at bedtime as needed (sleep). 11/28/16   Tonia Ghent, MD  midodrine (PROAMATINE) 2.5 MG tablet TAKE 1 TABLET BY MOUTH 2 TIMES DAILY WITH MEALS. 04/17/17   Tonia Ghent, MD  nitroGLYCERIN (NITROSTAT) 0.4 MG SL tablet Place 1 tablet (0.4 mg total) under the tongue every 5 (five) minutes as needed. For chest pain Patient taking differently: Place 0.4 mg under the tongue every 5 (five) minutes as needed for chest pain.  05/21/15   Tonia Ghent, MD  pantoprazole  (PROTONIX) 40 MG tablet TAKE 1 TABLET BY MOUTH 2 TIMES DAILY. 04/17/17   [name], MD  potassium chloride (K-DUR) 10 MEQ tablet TAKE 1 TABLETS BY MOUTH 2 TIMES DAILY. 05/06/17   Tonia Ghent, MD  PRESCRIPTION MEDICATION Inhale into the lungs at bedtime. CPAP    [provider]  prochlorperazine (COMPAZINE) 10 MG tablet Take 1 tablet (10 mg total) by mouth every 8 (eight) hours as needed (headache, nausea or vomiting). 03/19/16   Forde Dandy, MD  rosuvastatin (CRESTOR) 40 MG tablet Take 1 tablet (40 mg total) by mouth daily. 12/04/16   [name], MD  senna-docusate (SENNA S) 8.6-50 MG tablet Take 1 tablet by mouth 2 (two) times daily. 11/28/16   Tonia Ghent, MD  sertraline (ZOLOFT) 100 MG tablet TAKE 1 TABLET BY MOUTH DAILY. 04/17/17   Sater, Nanine Means, MD  tiZANidine (ZANAFLEX) 4 MG capsule Take 1 capsule (4 mg total) by mouth as needed. 03/06/17   Sater, Nanine Means, MD    Family History Family History  Problem Relation Age of Onset  . Diabetes type II Mother   . CAD Father 4       CABG, PCI, died at 23  . Heart attack Father 73  . Hypertension Brother   . Diabetes Brother   . Hyperlipidemia Brother   . Brain cancer Maternal Grandmother   . COPD Paternal Grandfather   . Coronary artery disease Unknown   . Other Unknown        denies family history of DVT/PE  . Colon cancer Neg Hx   . Stroke Neg Hx   . Prostate cancer Neg Hx     Social History Social History   Tobacco Use  . Smoking status: Former Smoker    Packs/day: 2.00    Years: 4.00    Pack years: 8.00    Types: Cigarettes    Last attempt to quit: 01/30/1969    Years since quitting: 48.2  . Smokeless tobacco: Never Used  . Tobacco comment: "quit smoking cigarettes  in the 1970's"  Substance Use Topics  . Alcohol use: No    Alcohol/week: 0.0 oz  . Drug use: No    Comment: "smoked some pot in college"     Allergies   Diphenhydramine hcl; Adhesive [tape]; Proscar [  finasteride]; and  Tamsulosin   Review of Systems Review of Systems  Constitutional: Negative for fever.  Cardiovascular: Positive for syncope. Negative for chest pain.  Gastrointestinal: Positive for bowel incontinence. Negative for vomiting.  Musculoskeletal: Negative for back pain.  Neurological: Positive for headaches.  All other systems reviewed and are negative.    Physical Exam Updated Vital Signs BP (!) 142/85   Pulse 70   Temp 98.7 F (37.1 C) (Oral)   Resp 16   Ht 1.93 m (6\' 4" )   Wt 99.8 kg (220 lb)   SpO2 98%   BMI 26.78 kg/m   Physical Exam  CONSTITUTIONAL: Elderly, no acute distress HEAD: Normocephalic/atraumatic EYES: EOMI/PERRL ENMT: Mucous membranes moist, no signs of trauma NECK: supple no meningeal signs SPINE/BACK:entire spine nontender, no bruising/crepitance/stepoffs noted to spine CV: S1/S2 noted, no murmurs/rubs/gallops noted LUNGS: Lungs are clear to auscultation bilaterally, no apparent distress Chest - stable and nontender ABDOMEN: soft, nontender GU:no cva tenderness NEURO: Pt is awake/alert/appropriate, chronic left-sided paresis noted.  Full movement of right upper and right lower extremity EXTREMITIES: pulses normal/equal, full ROM, tenderness to left anterior shoulder, but no deformity.  Tenderness with range of motion of left hip, but no deformity.  All other extremities/joints palpated/ranged and nontender SKIN: warm, color normal PSYCH: no abnormalities of mood noted, alert and oriented to situation   ED Treatments / Results  Labs (all labs ordered are listed, but only abnormal results are displayed) Labs Reviewed  BASIC METABOLIC PANEL - Abnormal; Notable for the following components:      Result Value   Calcium 8.5 (*)    All other components within normal limits  CBC - Abnormal; Notable for the following components:   Hemoglobin 11.3 (*)    HCT 37.5 (*)    MCH 24.6 (*)    RDW 17.0 (*)    All other components within normal limits    URINALYSIS, ROUTINE W REFLEX MICROSCOPIC - Abnormal; Notable for the following components:   APPearance CLOUDY (*)    Ketones, ur 5 (*)    All other components within normal limits    EKG  EKG Interpretation  Date/Time:  Tuesday May 06 2017 18:35:12 EST Ventricular Rate:  74 PR Interval:  152 QRS Duration: 88 QT Interval:  450 QTC Calculation: 499 R Axis:     Text Interpretation:  Normal sinus rhythm Septal infarct , age undetermined T wave abnormality, consider lateral ischemia Abnormal ECG Confirmed by Ripley Fraise [phone]) on 05/06/2017 11:58:27 PM       Radiology Ct Head Wo Contrast  Result Date: 05/06/2017 CLINICAL DATA:  Fall with left-sided weakness EXAM: CT HEAD WITHOUT CONTRAST TECHNIQUE: Contiguous axial images were obtained from the base of the skull through the vertex without intravenous contrast. COMPARISON:  MRI 03/19/2016, CT brain 03/19/2016 FINDINGS: Brain: No acute territorial infarction, hemorrhage or intracranial mass is visualized. Encephalomalacia in the right temporal lobe, sub insula, and right basal ganglia as before. Punctate foci of increased density within the right white matter adjacent to the ventricle, may reflect calcification or sequela of previously noted hemorrhage. Ex vacuo dilatation of the right lateral ventricle as before. Small vessel ischemic changes of the white matter. Atrophy. Vascular: No hyperdense vessels.  Carotid artery calcification Skull: No fracture or suspicious lesion Sinuses/Orbits: Mucosal thickening in the maxillary and ethmoid sinuses. No acute orbital abnormality. Bilateral lens extraction Other: None IMPRESSION: 1. No CT evidence for acute intracranial abnormality 2. Atrophy and old right MCA infarct. Areas of increased  attenuation within the right white matter adjacent to the ventricle, likely related to prior hemorrhage, do not appear significantly changed in size. Small vessel ischemic changes of the white matter. 3.  Sinus disease Electronically Signed   By: Donavan Foil M.D.   On: 05/06/2017 19:50   Dg Shoulder Left  Result Date: 05/06/2017 CLINICAL DATA:  Pain following fall EXAM: LEFT SHOULDER - 2+ VIEW COMPARISON:  None. FINDINGS: Frontal, oblique, and Y scapular images were obtained. Bones are diffusely osteoporotic. There is no appreciable acute fracture or dislocation. There is an old healed lateral left clavicle fracture. There is moderately severe osteoarthritic change in the acromioclavicular joint. There is moderate osteoarthritis in the glenohumeral joint. No erosive change. Visualized left lung clear. IMPRESSION: Generalized osteoarthritic change, somewhat more severe in the acromioclavicular than glenohumeral joint. Bones diffusely osteoporotic. No acute fracture or dislocation. Electronically Signed   By: Lowella Grip III M.D.   On: 05/06/2017 20:05   Dg Hip Unilat With Pelvis 2-3 Views Left  Result Date: 05/06/2017 CLINICAL DATA:  Pain following fall EXAM: DG HIP (WITH OR WITHOUT PELVIS) 2-3V LEFT COMPARISON:  None. FINDINGS: Frontal pelvis as well as frontal and lateral left hip images were obtained. Postoperative changes noted in the right proximal femur. There is no appreciable acute fracture or dislocation. There is mild symmetric narrowing of both hip joints. No erosive change. Bones are osteoporotic. There is aortoiliac atherosclerosis. IMPRESSION: Postoperative change right proximal femur. No acute fracture or dislocation. Bones osteoporotic. Mild symmetric narrowing both hip joints. There is aortoiliac atherosclerosis. Aortic Atherosclerosis (ICD10-I70.0). Electronically Signed   By: Lowella Grip III M.D.   On: 05/06/2017 20:07    Procedures Procedures   Medications Ordered in ED Medications  HYDROcodone-acetaminophen (NORCO/VICODIN) 5-325 MG per tablet 1 tablet (1 tablet Oral Given 05/07/17 0032)     Initial Impression / Assessment and Plan / ED Course  I have reviewed the  triage vital signs and the nursing notes.  Pertinent labs & imaging results that were available during my care of the patient were reviewed by me and considered in my medical decision making (see chart for details).     Patient here in the ED after a fall/syncopal episode 2 days ago.  Sent by PCP for CT head.  CT head is negative.  All other imaging is negative.  Patient reports some pain with standing, but does feel he can manage this at home.  He request discharge home.  Due to his chronic medical conditions, will order home PT and home health referral.  Wife and patient are both both agreeable for discharge. Suspect fall was likely due to standing up potential orthostatic hypotension.  Could also be related to his difficulty ambulating due to previous stroke  Final Clinical Impressions(s) / ED Diagnoses   Final diagnoses:  Syncope and collapse  Concussion with loss of consciousness of 30 minutes or less, initial encounter  Sprain of left shoulder, unspecified shoulder sprain type, initial encounter  Sprain of left hip, initial encounter    ED Discharge Orders    None       Ripley Fraise, MD 05/07/17 [phone]

## 2017-05-07 NOTE — Telephone Encounter (Signed)
Lovella Hardie J. Clydene Laming, RN, BSN, [employer] [phone] Spoke with pt via telephone regarding discharge planning for [facility]. Offered pt list of home health agencies to choose from.  Pt chose [facility] to render services. Havery Moros of [facility] notified. Patient made aware that [facility] will be in contact in 24-48 hours.  No DME needs identified at this time.

## 2017-05-12 ENCOUNTER — Encounter: Payer: Self-pay | Admitting: Cardiovascular Disease

## 2017-05-12 ENCOUNTER — Telehealth: Payer: Self-pay | Admitting: Family Medicine

## 2017-05-12 ENCOUNTER — Ambulatory Visit (INDEPENDENT_AMBULATORY_CARE_PROVIDER_SITE_OTHER): Payer: Medicare Other | Admitting: Cardiovascular Disease

## 2017-05-12 VITALS — BP 118/76 | HR 81 | Ht 72.0 in | Wt 220.0 lb

## 2017-05-12 DIAGNOSIS — I25810 Atherosclerosis of coronary artery bypass graft(s) without angina pectoris: Secondary | ICD-10-CM

## 2017-05-12 DIAGNOSIS — I5042 Chronic combined systolic (congestive) and diastolic (congestive) heart failure: Secondary | ICD-10-CM | POA: Diagnosis not present

## 2017-05-12 DIAGNOSIS — Z7901 Long term (current) use of anticoagulants: Secondary | ICD-10-CM

## 2017-05-12 DIAGNOSIS — G4733 Obstructive sleep apnea (adult) (pediatric): Secondary | ICD-10-CM | POA: Diagnosis not present

## 2017-05-12 DIAGNOSIS — Z951 Presence of aortocoronary bypass graft: Secondary | ICD-10-CM

## 2017-05-12 DIAGNOSIS — G4719 Other hypersomnia: Secondary | ICD-10-CM

## 2017-05-12 NOTE — Telephone Encounter (Signed)
Copied from [place]. Topic: Quick Communication - See Telephone Encounter >> May 12, 2017  2:19 PM Synthia Innocent wrote: CRM for notification. See Telephone encounter for: Believes labs that are order by Dr Damita Dunnings are the same ones the hospital just did. Please advise, would like to hold off if possible.   05/12/17.

## 2017-05-12 NOTE — Progress Notes (Signed)
Patient ID: DETRAVION TESTER, male   DOB: 1950/06/19, 67 y.o.   MRN: [id number]    Primary MD: Dr. [name]  HPI: Gregory Wiley is a 67 y.o. male presents to the office today for a 5 month follow-up cardiology/sleep evaluation. He is a former patient of Dr. Rex Kras.  Gregory Wiley has established CAD and underwent stenting of his RCA in May 2011. He is s/p CABG surgery with LIMA to the LAD, sequential vein to the distal RCA and PDA, and vein to the acute marginal branch of his right coronary artery. In April 2013 he developed unstable angina and ruled in for non-ST segment elevation myocardial infarction. Catheterization revealed 99% stenosis of the large left circumflex vessel and he underwent successful stenting with insertion of a 4.0x18 mm resolute DES stent post dilated to 4.5 mm.  Additional problems include obstructive sleep apnea. On his initial polysomnogram in 2010 AHI was 11.3 overall, but during REM sleep he had moderate sleep apnea with an AHI of 24.8. He never did initiate CPAP therapy at that time. However, since his myocardial infarction he has been using CPAP therapy since July 2013.  When I last saw him, he was only utilizing CPAP intermittently.  He now admits to much more frequent use but there are times when he does not use treatment.  He has a history of hyperlipidemia, hypertension, and morbid obesity. An echo Doppler study in May 2013 suggested an ejection fraction of 38% and on nuclear imaging  was 37% with scar in the LAD territory.  He was admitted to [facility] hospital in November 2015 with a large right MCA territory CVA.  TEE was negative for embolic source.  EF was 45% on echocardiogram.  He underwent implantable loop recorder by Dr. Rayann Heman. He was readmitted with bilateral submassive pulmonary embolism.  Venous Dopplers were positive for DVT.  Ejection fraction was 25-30%, which is felt to be contributed by strain from his pulmonary emboli.  He had mild troponin elevation which  was felt to be due to demand ischemia and has been on Xarelto for anticoagulation.  He was admitted on the [facility] hospitalist service on 11/11/2014 with dehydration.  Several of his medications were held.  I reviewed his hospital record.  He had a history of nausea and vomiting that had been going on for approximatey a month.  He has continued to feel weak and in his words, "feels terrible" since his hospitalization.  He has had issues with headaches which he did see neurology.  He has not been eating well.  He seeing Dr. Damita Dunnings at [facility] for primary care.  He denies chest pressure.  He denies fevers.  He is on Xarelto anticoagulation and denies overt bleeding.  There is a remote history of erosive gastroduodenitis, GERD, esophageal stricture for which in the past he had been seen by Dr. Fuller Plan.  When I previously saw him, he was tachycardic which I felt was contributed by his Ritalin.  He has since been off this medication.   In November 2017, he had removal of his loop recorder by Dr. Rayann Heman.  This had been initially placed at that in 2015 following his large right middle cerebral artery stroke.  Over the last several years he admits to a purposeful 170 pound weight loss with a peak weight at 346 down to 193.  His initial sleep study was in September 2010.  When I last saw him in September 2018.  He complained of significant fatigability.  An Epworth Sleepiness Scale score was calculated and this endorsed at 15 which is consistent with excessive daytime sleepiness.  He also has noticed lower extremity edema.    Since I last saw him, he underwent a sleep study on 12/14/2016.  He was found to have severe sleep apnea with an AHI of 36.1 and RDI 40.4.  CPAP was started at 5 cm and was titrated up to 12 cm.  Oxygen desaturation was 87%.  He was unable to achieve any REM sleep on the diagnostic portion of the study.  He was set up with CPAP on 02/28/2017.  DME's [facility].  Hollace Kinnier was obtained  from January 24 through separate 22 2019.  He is compliant with 97% of usage stays and 87% of usage greater than 4 hours.  He is averaging 7 hours and 33 minutes of sleep per night.  At 11 cm water pressure.  AHI is 1.2.  He does have moderate leak almost on a daily basis.  He has been using nasal pillows, but upon further questioning, there is significant oral venting and mouth breathing.  Epworth Sleepiness Scale score endorsed at 17 today, which is compatible with residual daytime sleepiness. He presents for evaluation.  Past Medical History:  Diagnosis Date  . Adrenal insufficiency ([place])   . Anxiety   . Basal cell carcinoma of skin of left ankle   . Bleeding stomach ulcer 2015  . Coronary artery disease    stent, then CABG 07/20/10  . Daily headache    "for the past month" (11/11/2014)  . Depression   . Diverticulosis   . GERD (gastroesophageal reflux disease)   . Gout   . H/O cardiomyopathy    ischemic, now last echo 07/30/11, EF 38%W  . History of hiatal hernia   . Hyperlipidemia   . Hypertension   . Internal hemorrhoids   . Left hemiplegia ([place])   . NSTEMI (non-ST elevated myocardial infarction) ([place])    NSTEMI- last cath 06/2011-Stent to LCX-DES, last nuc 07/30/11 low risk  . Obesity (BMI 30-39.9)   . OSA on CPAP    since July 2013- uses CPAP sometimes , states he has lost 147 lbs. since stroke & doesn't use the CPAP as much as he use to.   . Plantar fasciitis of left foot   . Pneumonia    hosp. 12/2014  . Pulmonary embolism ([place]) 03/2014  . Stroke ([place]) 01/2014   "no control of LUE, can slightly LLE; memory problems since" (11/11/2014)  . Tubular adenoma of colon     Past Surgical History:  Procedure Laterality Date  . BASAL CELL CARCINOMA EXCISION Left 2015   ankle  . CARDIAC SURGERY    . CATARACT EXTRACTION Bilateral   . COLONOSCOPY N/A 02/10/2013   Procedure: COLONOSCOPY;  Surgeon: Ladene Artist, MD;  Location: [facility];  Service: Endoscopy;  Laterality: N/A;  .  CORONARY ANGIOPLASTY WITH STENT PLACEMENT  07/01/2011   DES-Resolute to native LCX  . CORONARY ANGIOPLASTY WITH STENT PLACEMENT  12/01/2007   COMPLEX 5 LESION PCI INCLUDING CUTTING BALLOON AND 4 CYPHER DESs  . CORONARY ARTERY BYPASS GRAFT  07/20/2010    LIMA-LAD; VG-ACUTE MARG of RCA; Seq VG-distal RCA & then pda  . EP IMPLANTABLE DEVICE N/A 02/14/2016   Procedure: Loop Recorder Removal;  Surgeon: Thompson Grayer, MD;  Location: [facility] LAB;  Service: Cardiovascular;  Laterality: N/A;  . ESOPHAGOGASTRODUODENOSCOPY  01/2014   gastric ulcer, erosive gastroduodenitis, H Pylori negative.   Marland Kitchen  ESOPHAGOGASTRODUODENOSCOPY (EGD) WITH PROPOFOL N/A 11/14/2014   Procedure: ESOPHAGOGASTRODUODENOSCOPY (EGD) WITH PROPOFOL;  Surgeon: Milus Banister, MD;  Location: [place] [facility];  Service: Endoscopy;  Laterality: N/A;  . HAND SURGERY Right    to take glass out  . HERNIA REPAIR    . INTRAMEDULLARY (IM) NAIL INTERTROCHANTERIC Right 02/07/2015   Procedure: INTRAMEDULLARY (IM) NAIL INTERTROCHANTRIC RIGHT HIP;  Surgeon: Renette Butters, MD;  Location: [facility];  Service: Orthopedics;  Laterality: Right;  . KNEE ARTHROSCOPY Right   . LEFT HEART CATHETERIZATION WITH CORONARY/GRAFT ANGIOGRAM  07/01/2011   Procedure: LEFT HEART CATHETERIZATION WITH Beatrix Fetters;  Surgeon: Sanda Klein, MD;  Location: [facility] CATH LAB;  Service: Cardiovascular;;  . LOOP RECORDER IMPLANT  02/15/2014   MDT LINQ implanted by Dr Rayann Heman for cryptogenic stroke  . PERCUTANEOUS CORONARY STENT INTERVENTION (PCI-S) Right 07/01/2011   Procedure: PERCUTANEOUS CORONARY STENT INTERVENTION (PCI-S);  Surgeon: Sanda Klein, MD;  Location: [facility] C[facility] LAB;  Service: Cardiovascular;  Laterality: Right;  . PICC LINE PLACE PERIPHERAL ([place] HX)  06/2015  . RETINAL DETACHMENT SURGERY Right   . TEE WITHOUT CARDIOVERSION N/A 02/15/2014   Procedure: TRANSESOPHAGEAL ECHOCARDIOGRAM (TEE);  Surgeon: Josue Hector, MD;  Location: [facility];  Service:  Cardiovascular;  Laterality: N/A;  . UMBILICAL HERNIA REPAIR  2006    Allergies  Allergen Reactions  . Diphenhydramine Hcl Anaphylaxis  . Adhesive [Tape] Other (See Comments)    Tears skin - please use paper tape  . Proscar [Finasteride] Other (See Comments)    Lightheaded.   . Tamsulosin Other (See Comments)    Dizzy, vomiting.     Current Outpatient Medications  Medication Sig Dispense Refill  . acetaminophen (TYLENOL) 325 MG tablet Take 1 tablet (325 mg total) by mouth every 6 (six) hours as needed for mild pain.    Marland Kitchen apixaban (ELIQUIS) 5 MG TABS tablet Take 1 tablet (5 mg total) by mouth 2 (two) times daily. 180 tablet 3  . aspirin 81 MG EC tablet Take 1 tablet (81 mg total) by mouth daily. Swallow whole. 90 tablet 3  . cholecalciferol (VITAMIN D) 1000 units tablet Take 1,000 Units by mouth daily.    Marland Kitchen doxepin (SINEQUAN) 10 MG capsule Take 1 capsule (10 mg total) by mouth at bedtime. 30 capsule 11  . ferrous sulfate 325 (65 FE) MG EC tablet Take 1 tablet (325 mg total) by mouth daily with breakfast. 90 tablet 3  . fludrocortisone (FLORINEF) 0.1 MG tablet Take 1 tablet (100 mcg total) by mouth 2 (two) times daily. 60 tablet 3  . fluticasone (FLONASE) 50 MCG/ACT nasal spray Place 2 sprays into both nostrils daily.    Marland Kitchen gabapentin (NEURONTIN) 300 MG capsule Take 1 capsule (300 mg total) by mouth at bedtime. 90 capsule 1  . HYDROcodone-acetaminophen (NORCO/VICODIN) 5-325 MG tablet Take 1 tablet by mouth daily as needed for moderate pain. 30 tablet 0  . Melatonin 10 MG CAPS Take 10 mg by mouth at bedtime as needed (sleep). 90 capsule 3  . midodrine (PROAMATINE) 2.5 MG tablet TAKE 1 TABLET BY MOUTH 2 TIMES DAILY WITH MEALS. 60 tablet 3  . nitroGLYCERIN (NITROSTAT) 0.4 MG SL tablet Place 1 tablet (0.4 mg total) under the tongue every 5 (five) minutes as needed. For chest pain (Patient taking differently: Place 0.4 mg under the tongue every 5 (five) minutes as needed for chest pain. ) 25  tablet 12  . pantoprazole (PROTONIX) 40 MG tablet TAKE 1 TABLET BY MOUTH 2 TIMES DAILY.  60 tablet 6  . potassium chloride (K-DUR) 10 MEQ tablet TAKE 1 TABLETS BY MOUTH 2 TIMES DAILY. (Patient taking differently: Take 20 mEq by mouth 2 (two) times daily. TAKE 1 TABLETS BY MOUTH 2 TIMES DAILY.)    . PRESCRIPTION MEDICATION Inhale into the lungs at bedtime. CPAP    . prochlorperazine (COMPAZINE) 10 MG tablet Take 1 tablet (10 mg total) by mouth every 8 (eight) hours as needed (headache, nausea or vomiting). 10 tablet 0  . rosuvastatin (CRESTOR) 40 MG tablet Take 1 tablet (40 mg total) by mouth daily. 90 tablet 3  . senna-docusate (SENNA S) 8.6-50 MG tablet Take 1 tablet by mouth 2 (two) times daily. 60 tablet prn  . sertraline (ZOLOFT) 100 MG tablet TAKE 1 TABLET BY MOUTH DAILY. 30 tablet 5  . tiZANidine (ZANAFLEX) 4 MG capsule Take 1 capsule (4 mg total) by mouth as needed. 90 capsule 3   No current facility-administered medications for this visit.     Social History   Socioeconomic History  . Marital status: Married    Spouse name: Not on file  . Number of children: 5  . Years of education: Not on file  . Highest education level: Not on file  Social Needs  . Financial resource strain: Not on file  . Food insecurity - worry: Not on file  . Food insecurity - inability: Not on file  . Transportation needs - medical: Not on file  . Transportation needs - non-medical: Not on file  Occupational History    Employer: [employer]    Comment: works at the [employer]  . Smoking status: Former Smoker    Packs/day: 2.00    Years: 4.00    Pack years: 8.00    Types: Cigarettes    Last attempt to quit: 01/30/1969    Years since quitting: 48.3  . Smokeless tobacco: Never Used  . Tobacco comment: "quit smoking cigarettes  in the 1970's"  Substance and Sexual Activity  . Alcohol use: No    Alcohol/week: 0.0 oz  . Drug use: No    Comment: "smoked some pot in college"  . Sexual  activity: Not Currently  Other Topics Concern  . Not on file  Social History Narrative   Quit smoking 40 years ago   Married 1996   Likes to play guitar and sing with church group unable since 02/12/14 stroke    [employer] 1970-1972, [place], no service related issues.      Family History  Problem Relation Age of Onset  . Diabetes type II Mother   . CAD Father 58       CABG, PCI, died at 28  . Heart attack Father 18  . Hypertension Brother   . Diabetes Brother   . Hyperlipidemia Brother   . Brain cancer Maternal Grandmother   . COPD Paternal Grandfather   . Coronary artery disease Unknown   . Other Unknown        denies family history of DVT/PE  . Colon cancer Neg Hx   . Stroke Neg Hx   . Prostate cancer Neg Hx    Additional social history is notable in that he is married has 5 children 4 grandchildren. He does try to walk. There is no tobacco or alcohol use.  ROS General: Negative; No fevers, chills, or night sweats;  HEENT: Negative; No changes in vision or hearing, sinus congestion, difficulty swallowing Pulmonary: Negative; No cough, wheezing, shortness of breath, hemoptysis Cardiovascular: see HPI  GI: History of recent nausea and vomiting. GU: Negative; No dysuria, hematuria, or difficulty voiding Musculoskeletal: Negative; no myalgias, joint pain, or weakness Hematologic/Oncology: Negative; no easy bruising, bleeding Endocrine: Negative; no heat/cold intolerance; no diabetes Neuro: Right MCA stroke with residual left-sided weakness Skin: Negative; No rashes or skin lesions Psychiatric: Negative; No behavioral problems, depression Sleep: Positive for sleep apnea on CPAP; CPAP set up 02/28/2017 ;  daytime sleepiness, hypersomnolence;  no bruxism, restless legs, hypnogognic hallucinations, no cataplexy Other comprehensive 14 point system review is negative.  PE BP 118/76   Pulse 81   Ht 6' (1.829 m)   Wt 220 lb (99.8 kg)   BMI 29.84 kg/m    Repeat blood pressure by  me was 104/68  Wt Readings from Last 3 Encounters:  05/12/17 220 lb (99.8 kg)  05/06/17 220 lb (99.8 kg)  05/06/17 220 lb (99.8 kg)   General: Alert, oriented, no distress.  Skin: normal turgor, no rashes, warm and dry; bearded HEENT: Normocephalic, atraumatic. Pupils equal round and reactive to light; sclera anicteric; extraocular muscles intact;  Nose without nasal septal hypertrophy Mouth/Parynx benign; Mallinpatti scale 3 Neck: No JVD, no carotid bruits; normal carotid upstroke Lungs: clear to ausculatation and percussion; no wheezing or rales Chest wall: without tenderness to palpitation Heart: PMI not displaced, RRR, s1 s2 normal, 1/6 systolic murmur, no diastolic murmur, no rubs, gallops, thrills, or heaves Abdomen: soft, nontender; no hepatosplenomehaly, BS+; abdominal aorta nontender and not dilated by palpation. Back: no CVA tenderness Pulses 2+ Musculoskeletal: full range of motion, normal strength, no joint deformities Extremities: Bilateral lower 70 edema, slightly improved from previously no clubbing cyanosis or edema, Homan's sign negative  Neurologic: grossly nonfocal; Cranial nerves grossly wnl Psychologic: Normal mood and affect         12/04/2016 ECG (independently read by me): Normal sinus rhythm at 74 bpm.  QRS complex V1 through V3.  Previously noted lateral ST changes.  Normal intervals.  March 2017 ECG (independently read by me):  Normal sinus rhythm at 61 bpm. Poor R wave progression. Lateral ST segment changes.  QTc interval 469 ms  September 2016 ECG (independently read by me): Sinus tachycardia 1 18 bpm.  Left axis deviation.  Cannot rule out old anterolateral infarct.  ECG (independently read by me): Normal sinus rhythm at 93 bpm.  QS complex anteriorly compatible with old anterior MI  April 2016 ECG (independently read by me): Normal sinus rhythm at 79 bpm.  Old anteroseptal MI with QS complex V1 through V3.  LVH by voltage criteria in aVL.  Increased  QTc interval 488 ms.  ECG: Normal sinus rhythm at 60. QS complex in V1 through V3 concordant with his documented LAD region scar. T-wave inversion in leads 1 and avL unchanged.   LABS:  BMP Latest Ref Rng & Units 05/06/2017 04/09/2017 03/25/2017  Glucose 65 - 99 mg/dL 89 95 90  BUN 6 - 20 mg/dL '10 14 15  '$ Creatinine 0.61 - 1.24 mg/dL 1.00 0.91 0.84  Sodium 135 - 145 mmol/L 140 141 141  Potassium 3.5 - 5.1 mmol/L 3.7 3.8 3.7  Chloride 101 - 111 mmol/L 105 107 108  CO2 22 - 32 mmol/L '25 28 27  '$ Calcium 8.9 - 10.3 mg/dL 8.5(L) 8.3(L) 8.1(L)    Hepatic Function Latest Ref Rng & Units 04/09/2017 03/25/2017 11/26/2016  Total Protein 6.0 - 8.3 g/dL 6.5 6.3 6.5  Albumin 3.5 - 5.2 g/dL 3.2(L) 3.3(L) 3.6  AST 0 - 37 U/L 12 15 20  ALT 0 - 53 U/L '7 8 11  '$ Alk Phosphatase 39 - 117 U/L 49 51 49  Total Bilirubin 0.2 - 1.2 mg/dL 0.5 0.4 0.3  Bilirubin, Direct 0.0 - 0.3 mg/dL - - 0.1    CBC Latest Ref Rng & Units 05/06/2017 04/09/2017 03/25/2017  WBC 4.0 - 10.5 K/uL 6.4 4.5 4.3  Hemoglobin 13.0 - 17.0 g/dL 11.3(L) 9.6(L) 9.0 Repeated and verified X2.(L)  Hematocrit 39.0 - 52.0 % 37.5(L) 30.9(L) 29.2(L)  Platelets 150 - 400 K/uL 229 172.0 127.0(L)   Lab Results  Component Value Date   MCV 81.5 05/06/2017   MCV 80.0 04/09/2017   MCV 81.3 03/25/2017   Lab Results  Component Value Date   TSH 1.035 12/23/2014   Lab Results  Component Value Date   HGBA1C 5.6 02/13/2014    BNP    Component Value Date/Time   PROBNP 1,224.0 (H) 06/30/2011 0202    Lipid Panel     Component Value Date/Time   CHOL 113 08/30/2016 1508   CHOL 199 01/04/2013 1206   TRIG 40.0 08/30/2016 1508   TRIG 113 01/04/2013 1206   HDL 48.10 08/30/2016 1508   HDL 47 01/04/2013 1206   CHOLHDL 2 08/30/2016 1508   VLDL 8.0 08/30/2016 1508   LDLCALC 57 08/30/2016 1508   LDLCALC 129 (H) 01/04/2013 1206     RADIOLOGY: No results found.  IMPRESSION:  1. OSA (obstructive sleep apnea)   2. Coronary artery disease involving  coronary bypass graft of native heart without angina pectoris   3. 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   4. Chronic combined systolic and diastolic congestive heart failure ([place])   5. Chronic anticoagulation   6. Excessive daytime sleepiness     ASSESSMENT AND PLAN: Gregory Wiley is a 67 year old gentleman with a history of morbid obesity, hypertension, known coronary artery disease who is status post stenting of his RCA in May 2011 with subsequent CABG surgery. In April 2013 he was found to have subtotal stenosis in a very large circumflex vessel and underwent successful insertion of a large Resolute DES stent. A nuclear perfusion study in May 2013  revealed an ejection fraction approximately 37%.   He has suffered a large right MCA CVA and had an implantable loop recorder to assess for potential atrial fibrillation.  He subsequently suffered bilateral submassive pulmonary emboli and has developed a cardiomyopathy with a drop in ejection fraction to 25-30% noted at that time.   A follow-up CT revealed a chronic filling defect in the distal left vertebral artery compatible with soft plaque or thromboembolic disease.  He has soft calcified plaque in the right vertebral artery region.  He had improved right middle cerebral artery patency since November 2015 without intracranial arterial occlusion.  There was evidence for his chronic right MCA infarct without acute intracranial abnormality.  He has had significant purposeful weight loss of approximately 170 pounds.  When I last saw most concerned about sleep apnea.  A sleep study revealed severe sleep apnea with an AHI of 36.1/h area.  However, on the diagnostic portion of the study.  He was not able to achieve any REM sleep.  He is now been on CPAP therapy since 02/28/2017.  He is meeting compliance standards.  He is averaging 7 hours and 33 minutes of sleep per night.  However, he continues to be significantly sleepy and oftentimes naps during the day.  An Epworth scale was  17.  Upon review of his download, he has a  significant mask leak.  He will need to have a chinstrap to see if this can improve his oral venting.  Otherwise, he may need full face mask.  However, he is fully bearded, which may interfere with his mask stability.  Blood pressure today is excellent and on repeat by me was 110/72.  He continues to anticoagulation and is tolerating eliquis without bleeding.  He continues to take low-dose Midodrin but denies recent orthostatic symptomes.  His peripheral edema has improved.  He's not having any chest pain or shortness of breath.  I will see him in one year for follow-up sleep evaluation.  [name], MD, [facility]  05/14/2017 7:28 PM

## 2017-05-12 NOTE — Patient Instructions (Signed)
Your physician wants you to follow-up in: 12 months with Dr. Kelly (sleep clinic).  You will receive a reminder letter in the mail two months in advance. If you don't receive a letter, please call our office to schedule the follow-up appointment.  

## 2017-05-13 NOTE — Telephone Encounter (Signed)
Wife advised.  Lab appt scheduled 06/10/17.

## 2017-05-13 NOTE — Telephone Encounter (Signed)
He had CBC done but not the iron panel.  Hgb was some better on the last CBC.  Would recheck CBC and iron panel using the orders in the EMR in about 3-4 weeks.  Thanks.

## 2017-05-14 ENCOUNTER — Encounter: Payer: Self-pay | Admitting: Cardiovascular Disease

## 2017-05-15 ENCOUNTER — Other Ambulatory Visit: Payer: Medicare Other

## 2017-05-16 ENCOUNTER — Telehealth: Payer: Self-pay | Admitting: Family Medicine

## 2017-05-16 NOTE — Telephone Encounter (Signed)
See other phone note dated 05/16/17; Dr Damita Dunnings has already ordered the home health PT.

## 2017-05-16 NOTE — Telephone Encounter (Signed)
Copied from [place] [id number]. Topic: Quick Communication - See Telephone Encounter >> May 16, 2017  3:03 PM Aurelio Brash B wrote: CRM for notification. See Telephone encounter for:   Pts daughter wants Dr Damita Dunnings to know the pts  x-rays and CT were clean but the pt still can not stand or walk,  the ER Dr recommend home PT.  If any questions contact daughter, [name], at [phone]   05/16/17.

## 2017-05-16 NOTE — Telephone Encounter (Signed)
Verbal orders given to [employer]

## 2017-05-16 NOTE — Telephone Encounter (Signed)
See other encounter.

## 2017-05-16 NOTE — Telephone Encounter (Signed)
Copied from [place] [id number]. Topic: Quick Communication - See Telephone Encounter >> May 16, 2017  1:23 PM Aurelio Brash B wrote: CRM for notification. See Telephone encounter for:  Gregory Wiley   PT at [facility]  is requesting PT verbal orders for  1 x 1 then  2 x 4   His call back number is  [phone] 05/16/17.

## 2017-05-16 NOTE — Telephone Encounter (Signed)
Please give the order.  Thanks.   

## 2017-05-16 NOTE — Telephone Encounter (Signed)
Agree with PT and I had seen the notes.  Please give the order for PT- see other phone note.  Thanks.

## 2017-05-21 ENCOUNTER — Other Ambulatory Visit: Payer: Self-pay | Admitting: Family Medicine

## 2017-05-21 NOTE — Telephone Encounter (Signed)
Electronic refill request Last refill 11/28/16 #60/3 Last office visit 05/06/17

## 2017-05-21 NOTE — Telephone Encounter (Signed)
Sent. Thanks.   

## 2017-05-23 ENCOUNTER — Telehealth: Payer: Self-pay | Admitting: *Deleted

## 2017-05-23 NOTE — Telephone Encounter (Signed)
Copied from [place]. Topic: General - Other >> May 23, 2017  1:25 PM Synthia Innocent wrote: Reason for CRM: Need to confirm CHF dx. Please advise

## 2017-05-24 NOTE — Telephone Encounter (Signed)
Chronic combined systolic and diastolic congestive heart failure, dx I50.42.

## 2017-05-25 ENCOUNTER — Telehealth: Payer: Self-pay | Admitting: Family Medicine

## 2017-05-25 NOTE — Telephone Encounter (Signed)
Call pt.  He had opacities in the R middle and R lower lobe on CT.  Incidental finding.   Scan done by urology.  Please make sure he is aware.  See if he has any pulmonary symptoms.  Let me know.  Needs f/u CT chest in ~4 weeks to ensure resolution.  I put a reminder in the EMR about that.  Thanks.

## 2017-05-26 NOTE — Telephone Encounter (Signed)
Gregory Wiley advised.

## 2017-05-26 NOTE — Telephone Encounter (Signed)
Tried to return call with info and no answer and mailbox full.

## 2017-05-26 NOTE — Telephone Encounter (Signed)
Wife and patient advised.  No pulmonary problems and PT was just at the home and said his lungs sounded clear.

## 2017-05-27 NOTE — Telephone Encounter (Signed)
Noted. Thanks.  I'll order f/u CT chest in about 4 weeks.

## 2017-06-04 ENCOUNTER — Telehealth: Payer: Self-pay

## 2017-06-04 NOTE — Telephone Encounter (Signed)
Reasonable to offer OV if patient accepts eval.  The groin pain could be from a muscle strain but it would be hard to say w/o checking the patient.  If he feels a lump in the area with a cough, then hernia is possible.  I can't say for certain o/w.  Thanks.

## 2017-06-04 NOTE — Telephone Encounter (Signed)
Copied from [place] [id number]. Topic: General - Other >> Jun 04, 2017 10:44 AM Oneta Rack wrote: Caller name: Marylyn Ishihara  Relation to pt: PTA  from [place]  Call back number: [phone]    Reason for call:  Physical Therapist Assistant wanted to report patient falling  today, patient experiencing back, neck, shoulder blade and right side jaw pain, patient denied medical attention but as per PTA protocol to inform PCP, please advise  >> Jun 04, 2017 11:23 AM Oneta Rack wrote: Caller name: Marylyn Ishihara  Relation to pt: PT from [place]  Call back number: PT assistant  Pharmacy: [phone]   Reason for call:  Physical Therapist Assistant wanted to report patient falling  today, patient experiencing back, neck, shoulder blade and right side jaw pain, patient denied medical attention but as per PTA protocol to inform PCP, please advise

## 2017-06-04 NOTE — Telephone Encounter (Signed)
Patient notified as instructed by telephone and verbalized understanding. Appointment scheduled with Dr. Damita Dunnings Friday 06/06/17.

## 2017-06-04 NOTE — Telephone Encounter (Signed)
I spoke with Mr & Mrs Krone(on speaker phone) and pt is still hurting but does not want to make appt. Pt turned around in hallway this morning to ck to see why hall light did not come and fell in hall way; pt had dizziness for short period after falling. No dizziness now. Pt will cb if needs to be seen. Mrs Colclasure said after pt fell he felt like he had a hot flash in groin area; this is second time had this sensation. Mrs Nowak wonders what the hot flash in groin area could be?

## 2017-06-06 ENCOUNTER — Ambulatory Visit (INDEPENDENT_AMBULATORY_CARE_PROVIDER_SITE_OTHER): Payer: Medicare Other | Admitting: Family Medicine

## 2017-06-06 ENCOUNTER — Encounter: Payer: Self-pay | Admitting: Family Medicine

## 2017-06-06 VITALS — BP 122/72 | HR 69 | Temp 97.8°F | Wt 219.2 lb

## 2017-06-06 DIAGNOSIS — R454 Irritability and anger: Secondary | ICD-10-CM | POA: Diagnosis not present

## 2017-06-06 DIAGNOSIS — R0789 Other chest pain: Secondary | ICD-10-CM

## 2017-06-06 DIAGNOSIS — D649 Anemia, unspecified: Secondary | ICD-10-CM | POA: Diagnosis not present

## 2017-06-06 DIAGNOSIS — R9389 Abnormal findings on diagnostic imaging of other specified body structures: Secondary | ICD-10-CM | POA: Diagnosis not present

## 2017-06-06 LAB — IBC PANEL
Iron: 21 ug/dL — ABNORMAL LOW (ref 42–165)
Saturation Ratios: 6.5 % — ABNORMAL LOW (ref 20.0–50.0)
Transferrin: 230 mg/dL (ref 212.0–360.0)

## 2017-06-06 LAB — CBC WITH DIFFERENTIAL/PLATELET
Basophils Absolute: 0 10*3/uL (ref 0.0–0.1)
Basophils Relative: 0.8 % (ref 0.0–3.0)
Eosinophils Absolute: 0.3 10*3/uL (ref 0.0–0.7)
Eosinophils Relative: 6.4 % — ABNORMAL HIGH (ref 0.0–5.0)
HCT: 36 % — ABNORMAL LOW (ref 39.0–52.0)
Hemoglobin: 11.4 g/dL — ABNORMAL LOW (ref 13.0–17.0)
Lymphocytes Relative: 19.2 % (ref 12.0–46.0)
Lymphs Abs: 1 10*3/uL (ref 0.7–4.0)
MCHC: 31.8 g/dL (ref 30.0–36.0)
MCV: 79.9 fl (ref 78.0–100.0)
Monocytes Absolute: 0.4 10*3/uL (ref 0.1–1.0)
Monocytes Relative: 7.5 % (ref 3.0–12.0)
Neutro Abs: 3.4 10*3/uL (ref 1.4–7.7)
Neutrophils Relative %: 66.1 % (ref 43.0–77.0)
Platelets: 134 10*3/uL — ABNORMAL LOW (ref 150.0–400.0)
RBC: 4.5 Mil/uL (ref 4.22–5.81)
RDW: 22 % — ABNORMAL HIGH (ref 11.5–15.5)
WBC: 5.1 10*3/uL (ref 4.0–10.5)

## 2017-06-06 NOTE — Progress Notes (Signed)
Patient was rude to [place] during check-in today.  He decided to get weighed, he opted in.  Lugene help position the wheelchair but she had to reset the scales.  He yelled at [employer].  She finished checking him in after she told him not to ever speak to her that way again.  She reported the incident to me.  I also talked to the patient about it.  He did not dispute any of the details.  His wife mentions that he has been more grouchy ever since he had a stroke.  I talked to the patient about his situation.  I am glad to help the patient.  We are all glad to help the patient.  We realize he has significant health difficulties and that makes it harder for him to do a lot of things during the day.  We are sympathetic.  He still has to be reasonable in conversation and he can never speak to staff that way again.  He understood.  He offered to apologize but by that point the day Lugene was at lunch.  We moved on.  Prev CT with opacities in the R middle and R lower lobe on CT, d/w pt.  Incidental finding.   Scan done by urology.  He needs f/u CT chest in ~4 weeks from date to ensure resolution.  I prev put a reminder in the EMR about that.  D/w pt.  He doesn't have cough, fevers.    He has seen urology in the meantime.  I will defer.  Due for recheck iron labs today.  See notes on labs.   He was recently moving to [place] and flipped the light on.  The light didn't come on, he turned around, then the fall backward.  No LOC, no syncope.  Pain in midline perineum, intermittent warm feeling- this sensation predates the fall and "not too often now."  No testicle pain. He has some R rib pain.  Can get a deep breath, but with pain.  No bruising.  No burning with urination, no blood in urine.  No abd pain.  No radicular pain.    PMH and SH reviewed  ROS: Per HPI unless specifically indicated in ROS section   Meds, vitals, and allergies reviewed.   GEN: nad, alert and oriented, in wheelchair at baseline.  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 HEENT: mucous membranes moist NECK: supple w/o LA CV: rrr.  PULM: ctab, no inc wob, pain with deep breath but equal breath sounds bilaterally.  He has some tenderness at the right lower anterior ribs without bruising.   ABD: soft, +bs EXT: trace BLE edema SKIN: no acute rash

## 2017-06-06 NOTE — Patient Instructions (Signed)
You likely have some nerve compression in your groin.  Use a cushion/doughnut pillow and update me as needed.   We'll get the follow up CT done later one.  Take care.  Glad to see you.

## 2017-06-08 ENCOUNTER — Other Ambulatory Visit: Payer: Self-pay | Admitting: Family Medicine

## 2017-06-08 DIAGNOSIS — R9389 Abnormal findings on diagnostic imaging of other specified body structures: Secondary | ICD-10-CM | POA: Insufficient documentation

## 2017-06-08 DIAGNOSIS — R454 Irritability and anger: Secondary | ICD-10-CM | POA: Insufficient documentation

## 2017-06-08 DIAGNOSIS — D509 Iron deficiency anemia, unspecified: Secondary | ICD-10-CM

## 2017-06-08 MED ORDER — FERROUS SULFATE 325 (65 FE) MG PO TBEC: 325.0000 mg | DELAYED_RELEASE_TABLET | Freq: Every day | ORAL | Status: DC

## 2017-06-08 NOTE — Assessment & Plan Note (Signed)
See above.  We may need to address his SSRI use in the future.  No change in medications at this point.

## 2017-06-08 NOTE — Assessment & Plan Note (Addendum)
His lungs are still clear and he has equal breath sounds bilaterally.  I do not have concern for pneumothorax or ominous chest pathology.  He does likely have soft tissue injury given the fall but he does not have visible external bruises noted.  This should gradually get better.  Supportive care.  Update me as needed.  Update me if the pain is worsening.  Regarding the fall at home, fall cautions discussed with patient.  I would still continue anticoagulation given his history.  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  This predates the fall.  Discussed with patient.  Reasonable to try to offload with variable pillow use. >25 minutes spent in face to face time with patient, >50% spent in counselling or coordination of care.

## 2017-06-08 NOTE — Assessment & Plan Note (Signed)
Prev CT with opacities in the R middle and R lower lobe on CT, d/w pt.  Incidental finding.   Scan done by urology.  He needs f/u CT chest in ~4 weeks from date to ensure resolution.  I prev put a reminder in the EMR about that.  D/w pt.  He doesn't have cough, fevers.

## 2017-06-08 NOTE — Assessment & Plan Note (Signed)
Recheck labs today.  See notes on labs. 

## 2017-06-09 ENCOUNTER — Other Ambulatory Visit: Payer: Self-pay | Admitting: *Deleted

## 2017-06-09 MED ORDER — FERROUS SULFATE 325 (65 FE) MG PO TABS: 325.0000 mg | ORAL_TABLET | Freq: Two times a day (BID) | ORAL | 5 refills | Status: DC

## 2017-06-10 ENCOUNTER — Other Ambulatory Visit: Payer: Medicare Other

## 2017-06-12 ENCOUNTER — Telehealth: Payer: Self-pay | Admitting: Family Medicine

## 2017-06-12 NOTE — Telephone Encounter (Signed)
Copied from [place] [id number]. Topic: Quick Communication - See Telephone Encounter >> Jun 12, 2017  2:58 PM Synthia Innocent wrote: CRM for notification. See Telephone encounter for: 06/12/17. Requesting verbal order for PT 2x a week for 3 weeks

## 2017-06-12 NOTE — Telephone Encounter (Signed)
Please give the order.  Thanks.   

## 2017-06-13 NOTE — Telephone Encounter (Signed)
Left detailed message on voicemail of contact number Thurmond Butts)

## 2017-06-21 DIAGNOSIS — S43402D Unspecified sprain of left shoulder joint, subsequent encounter: Secondary | ICD-10-CM | POA: Diagnosis not present

## 2017-06-21 DIAGNOSIS — S73102D Unspecified sprain of left hip, subsequent encounter: Secondary | ICD-10-CM | POA: Diagnosis not present

## 2017-06-21 DIAGNOSIS — R159 Full incontinence of feces: Secondary | ICD-10-CM | POA: Diagnosis not present

## 2017-06-21 DIAGNOSIS — S069X1D Unspecified intracranial injury with loss of consciousness of 30 minutes or less, subsequent encounter: Secondary | ICD-10-CM | POA: Diagnosis not present

## 2017-06-22 ENCOUNTER — Other Ambulatory Visit: Payer: Self-pay | Admitting: Family Medicine

## 2017-06-22 DIAGNOSIS — R911 Solitary pulmonary nodule: Secondary | ICD-10-CM

## 2017-06-22 DIAGNOSIS — R918 Other nonspecific abnormal finding of lung field: Secondary | ICD-10-CM

## 2017-06-22 NOTE — Progress Notes (Signed)
Due for f/u CT chest.  Ordered.  Thanks.   Wife advised and will await the call to get it scheduled.  Mike Craze, CMA  06/23/2017

## 2017-06-27 ENCOUNTER — Ambulatory Visit (INDEPENDENT_AMBULATORY_CARE_PROVIDER_SITE_OTHER)
Admission: RE | Admit: 2017-06-27 | Discharge: 2017-06-27 | Disposition: A | Discharge status: Home / Self Care | Payer: Medicare Other | Source: Ambulatory Visit | Attending: Family Medicine | Admitting: Family Medicine

## 2017-06-27 DIAGNOSIS — R911 Solitary pulmonary nodule: Secondary | ICD-10-CM | POA: Diagnosis not present

## 2017-06-27 DIAGNOSIS — R918 Other nonspecific abnormal finding of lung field: Secondary | ICD-10-CM | POA: Diagnosis not present

## 2017-07-18 ENCOUNTER — Encounter: Payer: Self-pay | Admitting: Family Medicine

## 2017-07-18 ENCOUNTER — Ambulatory Visit (INDEPENDENT_AMBULATORY_CARE_PROVIDER_SITE_OTHER): Payer: Medicare Other | Admitting: Family Medicine

## 2017-07-18 ENCOUNTER — Telehealth: Payer: Self-pay | Admitting: Family Medicine

## 2017-07-18 ENCOUNTER — Encounter

## 2017-07-18 VITALS — BP 122/66 | HR 71 | Temp 98.2°F | Ht 72.0 in

## 2017-07-18 DIAGNOSIS — K649 Unspecified hemorrhoids: Secondary | ICD-10-CM | POA: Diagnosis not present

## 2017-07-18 DIAGNOSIS — R9389 Abnormal findings on diagnostic imaging of other specified body structures: Secondary | ICD-10-CM | POA: Diagnosis not present

## 2017-07-18 DIAGNOSIS — R0602 Shortness of breath: Secondary | ICD-10-CM | POA: Diagnosis not present

## 2017-07-18 DIAGNOSIS — B356 Tinea cruris: Secondary | ICD-10-CM | POA: Diagnosis not present

## 2017-07-18 DIAGNOSIS — D509 Iron deficiency anemia, unspecified: Secondary | ICD-10-CM | POA: Diagnosis not present

## 2017-07-18 LAB — COMPREHENSIVE METABOLIC PANEL
ALT: 8 U/L (ref 0–53)
AST: 14 U/L (ref 0–37)
Albumin: 3.4 g/dL — ABNORMAL LOW (ref 3.5–5.2)
Alkaline Phosphatase: 64 U/L (ref 39–117)
BUN: 12 mg/dL (ref 6–23)
CO2: 29 mEq/L (ref 19–32)
Calcium: 8.6 mg/dL (ref 8.4–10.5)
Chloride: 106 mEq/L (ref 96–112)
Creatinine, Ser: 1.12 mg/dL (ref 0.40–1.50)
GFR: 69.38 mL/min (ref >60.00)
Glucose, Bld: 91 mg/dL (ref 70–99)
Potassium: 4 mEq/L (ref 3.5–5.1)
Sodium: 141 mEq/L (ref 135–145)
Total Bilirubin: 0.5 mg/dL (ref 0.2–1.2)
Total Protein: 6.7 g/dL (ref 6.0–8.3)

## 2017-07-18 LAB — CBC WITH DIFFERENTIAL/PLATELET
Basophils Absolute: 0 10*3/uL (ref 0.0–0.1)
Basophils Relative: 0.9 % (ref 0.0–3.0)
Eosinophils Absolute: 0.4 10*3/uL (ref 0.0–0.7)
Eosinophils Relative: 8 % — ABNORMAL HIGH (ref 0.0–5.0)
HCT: 38.9 % — ABNORMAL LOW (ref 39.0–52.0)
Hemoglobin: 12.5 g/dL — ABNORMAL LOW (ref 13.0–17.0)
Lymphocytes Relative: 25 % (ref 12.0–46.0)
Lymphs Abs: 1.2 10*3/uL (ref 0.7–4.0)
MCHC: 32.2 g/dL (ref 30.0–36.0)
MCV: 82.9 fl (ref 78.0–100.0)
Monocytes Absolute: 0.4 10*3/uL (ref 0.1–1.0)
Monocytes Relative: 7.3 % (ref 3.0–12.0)
Neutro Abs: 2.9 10*3/uL (ref 1.4–7.7)
Neutrophils Relative %: 58.8 % (ref 43.0–77.0)
Platelets: 135 10*3/uL — ABNORMAL LOW (ref 150.0–400.0)
RBC: 4.69 Mil/uL (ref 4.22–5.81)
RDW: 18.6 % — ABNORMAL HIGH (ref 11.5–15.5)
WBC: 5 10*3/uL (ref 4.0–10.5)

## 2017-07-18 LAB — IBC PANEL
Iron: 36 ug/dL — ABNORMAL LOW (ref 42–165)
Saturation Ratios: 11.7 % — ABNORMAL LOW (ref 20.0–50.0)
Transferrin: 219 mg/dL (ref 212.0–360.0)

## 2017-07-18 LAB — BRAIN NATRIURETIC PEPTIDE: Pro B Natriuretic peptide (BNP): 850 pg/mL — ABNORMAL HIGH (ref 0.0–100.0)

## 2017-07-18 MED ORDER — HYDROCORTISONE ACETATE 25 MG RE SUPP: 25.0000 mg | Freq: Two times a day (BID) | RECTAL | 3 refills | Status: DC | PRN

## 2017-07-18 MED ORDER — NYSTATIN 100000 UNIT/GM EX POWD: Freq: Two times a day (BID) | CUTANEOUS | 3 refills | Status: DC

## 2017-07-18 MED ORDER — ALBUTEROL SULFATE HFA 108 (90 BASE) MCG/ACT IN AERS
2.0000 | INHALATION_SPRAY | Freq: Four times a day (QID) | RESPIRATORY_TRACT | 0 refills | Status: DC | PRN
Start: 2017-07-18 — End: 2019-11-18

## 2017-07-18 MED ORDER — NITROGLYCERIN 0.4 MG SL SUBL: 0.4000 mg | SUBLINGUAL_TABLET | SUBLINGUAL | Status: AC | PRN

## 2017-07-18 MED ORDER — POTASSIUM CHLORIDE ER 10 MEQ PO TBCR: 20.0000 meq | EXTENDED_RELEASE_TABLET | Freq: Two times a day (BID) | ORAL | Status: DC

## 2017-07-18 NOTE — Telephone Encounter (Signed)
Copied from [place] [id number]. Topic: Quick Communication - See Telephone Encounter >> Jul 18, 2017  1:48 PM Arletha Grippe wrote: CRM for notification. See Telephone encounter for: 07/18/17. [facility] called - they do not have any anusol suppositories, can they substitute with cream? [phone]

## 2017-07-18 NOTE — Progress Notes (Signed)
He has noted a change in his breathing compared to baseline for the last 2 weeks.  No fevers.  No vomiting.  Cough in the AM, less later in the day.  Dry cough.  Some wheeze.  Some inc in LLE edema.  He can get can get SOB supine but still able to sleep in recliner that is mostly flat.  Noisier breathing recently, even with CPAP.  No CP.  He had trouble weighing today due to h/o CVA and stability with standing.  He has used SABA PRN in the springtime prev with some relief w/o ADE.   Prev CT with  1. Lung opacity in the right middle and lower lobes highlighted on 05/15/2017 abdominal CT have regressed, compatible with an inflammatory process. 2. Moderate bilateral pleural effusion that have increased from comparison exam. 3. Patchy airspace opacity in the bilateral lungs, favored inflammatory. Please correlate for pneumonia. Followup PA and lateral chest X-ray is recommended in 3-4 weeks to ensure Resolution.  D/w pt about prev CT.   Needed refill on anucort.   Done at [facility].    H/o IDA and due for f/u labs.  See notes on labs.  Still anticoagulated.  No known obvious blood loss.  B groin with fungal changes.  Had been a nagging issue for patient.  Discussed options.  See plan.  PMH and SH reviewed  ROS: Per HPI unless specifically indicated in ROS section   Meds, vitals, and allergies reviewed.   GEN: nad, alert and oriented,  in wheelchair, left-sided paralysis at baseline. HEENT: mucous membranes moist NECK: supple w/o LA CV: rrr. PULM: ctab, no inc wob ABD: soft, +bs EXT: 1+ BLE edema SKIN: no acute rash but chronic bilateral tinea cruris changes noted.

## 2017-07-18 NOTE — Telephone Encounter (Signed)
See what suppositories (of any variety) they have and let me know.  Thanks.  He prev did better with suppositories.

## 2017-07-18 NOTE — Telephone Encounter (Signed)
Pharmacy advised and will ask the patient where he would like the Rx transferred.

## 2017-07-18 NOTE — Patient Instructions (Addendum)
Go to the lab on the way out.  We'll contact you with your lab report. Try using albuterol in the meantime. Update me in a few days.  Don't change your other/regular meds yet.  We may need to taper the florinef.   Take care.  Glad to see you.

## 2017-07-20 ENCOUNTER — Other Ambulatory Visit: Payer: Self-pay | Admitting: Family Medicine

## 2017-07-20 DIAGNOSIS — D509 Iron deficiency anemia, unspecified: Secondary | ICD-10-CM

## 2017-07-20 DIAGNOSIS — B356 Tinea cruris: Secondary | ICD-10-CM | POA: Insufficient documentation

## 2017-07-20 DIAGNOSIS — K649 Unspecified hemorrhoids: Secondary | ICD-10-CM | POA: Insufficient documentation

## 2017-07-20 DIAGNOSIS — R9389 Abnormal findings on diagnostic imaging of other specified body structures: Secondary | ICD-10-CM

## 2017-07-20 NOTE — Assessment & Plan Note (Signed)
Use nystatin powder and update me as needed.  Prescription sent.

## 2017-07-20 NOTE — Assessment & Plan Note (Signed)
X-ray was down and we were not able to complete x-ray at time of office visit today.  Discussed.  Given his baseline difficulty with ambulation and OV it was likely not reasonable to send him to  another facility today.  We opted to check his labs first and go from there.  Unclear how much this could be related to seasonal changes.  Reasonable to try albuterol in the meantime.  Unclear how much his pleural effusions could contribute.  We may end up needing to taper his Florinef, especially if he continues to have more peripheral edema.  We talked about his pleural effusions in general and given his anticoagulation and his overall health I would be hesitant to tap these at this point.  Discussed with patient and he understood and agreed.  At this point still okay for outpatient follow-up.  See notes on labs. >25 minutes spent in face to face time with patient, >50% spent in counselling or coordination of care.

## 2017-07-20 NOTE — Assessment & Plan Note (Signed)
X-ray was down and we were not able to complete x-ray at time of office visit today.  Discussed.  Given his baseline difficulty with ambulation and OV it was likely not reasonable to send him to  another facility today.  We opted to check his labs first and go from there.

## 2017-07-20 NOTE — Assessment & Plan Note (Signed)
H/o IDA and due for f/u labs.  See notes on labs.  Still anticoagulated.  No known obvious blood loss.

## 2017-07-20 NOTE — Assessment & Plan Note (Signed)
Needed refill on anucort.   Done at [facility].  Update me as needed.

## 2017-08-06 ENCOUNTER — Ambulatory Visit (INDEPENDENT_AMBULATORY_CARE_PROVIDER_SITE_OTHER): Payer: Medicare Other | Admitting: Neurology

## 2017-08-06 ENCOUNTER — Other Ambulatory Visit: Payer: Self-pay

## 2017-08-06 ENCOUNTER — Encounter: Payer: Self-pay | Admitting: Neurology

## 2017-08-06 VITALS — BP 105/68 | HR 68 | Resp 20

## 2017-08-06 DIAGNOSIS — G4733 Obstructive sleep apnea (adult) (pediatric): Secondary | ICD-10-CM

## 2017-08-06 DIAGNOSIS — Z7901 Long term (current) use of anticoagulants: Secondary | ICD-10-CM | POA: Diagnosis not present

## 2017-08-06 DIAGNOSIS — I69952 Hemiplegia and hemiparesis following unspecified cerebrovascular disease affecting left dominant side: Secondary | ICD-10-CM | POA: Diagnosis not present

## 2017-08-06 MED ORDER — HYDROCODONE-ACETAMINOPHEN 5-325 MG PO TABS: 1.0000 | ORAL_TABLET | Freq: Every day | ORAL | 0 refills | Status: DC | PRN

## 2017-08-06 NOTE — Patient Instructions (Signed)
1.    For 2 weeks take 1/2 sertraline a day and then stop 2.    Take tizanidine nightly.

## 2017-08-06 NOTE — Progress Notes (Signed)
[facility]  PATIENT: Gregory Wiley DOB: 1950-04-04  REFERRING CLINICIAN: Tamsen Roers HISTORY FROM: patient   HISTORICAL  CHIEF COMPLAINT:  Chief Complaint  Patient presents with  . History of CVA    Denies new stroke sx. and reports compliance with Eliquis. He reports complaince with CPAP/fim  . 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   He feels he is stable for the most part.    Update 08/06/2017: He denies new stroke like symptoms but notes swallowing is harder.    He needs to wash everything down or it will get stuck.    His mouth is often dry.     He is on Eliquis and notes he bruises easily, especially when a puppy jumps on him.     From the stroke, he has left sided weakness and spasticity   Tizanidine is helping the spasticity.  He is walking a little bit better and now can go about 80 feet using a cane before stopping.       Mood is much better and we discussed stopping the zoloft  He uses CPAP nightly and has a new machine.     Download shows great AHI at 2.5 and 100% compliance and average usage of 7 hours.   He notes some mental fogginess at times but this is no worse.   He fatigues easily.      He notes some neck pain but is doing better with a new pillow.  He does not need a TPI.        Update 03/06/2017:    He denies any new neurologic symptoms.    He has spastic left hemiplegia and left foot drop.  He uses his cane around the house.  He can go 30 feet without too much of a problem A brace is heavy and he would rather walk without it.    He had one fall last month.   His foot is not flat when he stands up on the floor.   His neck is hurting more.  Pain stays in his neck with no radiation into the arm.    He feels left spasticity is about the same.    At times, the spasticity can be painful in the leg.  Cognition is stable. His left sided neglect improved over time.    He is on Eliquis as anticoagulation.  He has OSA and has a new machine and mask.   He showed me an app and he had 3.3 - 8.1 AHI over the past week.         He notes neck pain, right greater than left. He does not want trigger point injection. In the past, TPI's have helped but he finds them to be painful.  From 09/16/2016: Stroke:   He denies any new stroke symptoms. He remains on Elavil risk for stroke prophylaxis. He has not had any transient ischemic attacks or other transient neurologic symptoms. He continues to have left hemiplegia and he gets slurred speech when he is tired.   Stroke History:  On 02/12/2014, he woke up with weakness on the left side and was unable to stand up. He went to the emergency room.Marland Kitchen MRI of the brain showed an acute right MCA territory stroke that mostly involved the right temporal lobe with extension to the insula and the basal ganglia on the right. MR angiogram showed an  occlusion of the right M1 segment  Of note, he had been on aspirin and Plavix that had been discontinued a few weeks earlier due to a bleeding ulcer.   Ultrasound of the carotid arteries showed less than 39% stenosis. A transesophageal echocardiogram showed a LVEF equals 45%, there was septal hypokinesia, no PFO or ASD was identified.  Cardioembolism was suspected.  He was initially placed on Plavix but that was subsequently changed to Xarelto after he was found to have pulmonary embolisms while at rehabilitation center.  Gait/Strength/sensation:  Due to left hemiplegia, his gait is poor. He has had a few falls. With a quad cane he can walk about 30-50 feet if he is not tired and about 20-30 feet if he is tired. His bathroom is 30 feet away.  In the past, he was having orthostatic hypotension and had some episodes of syncope.    His leg also has left sided spasticity.   He also has numbness on the left side with some pain. He had some neglect and numbness initially that improved.   He has been on baclofen  in the past but felt no benefit and felt very sleepy.    Orthostatic hypotension:     He is on Florinef and midodrine.  He gets pre-syncope but no recent syncope.     Neck pain/Headache:  He continues to report a neck pain, better than last year but still daily.   In the past, occiptial nerve blocks helped to reduce the severe headache and neck pain.    Bladder:  He has urinary frequency and urgency this year.   He has not had incontinence.   Flomax was started but he flet dizzy and stopped.    OSA/sleep/sleepiness:    In the past, he had obstructive sleep apnea but stopped CPAP as he did not like the mask. However, he has lost > 150 pounds since the stroke.   He still snores but wife has not noted OSA.      He has excessive daytime sleepiness.  Methylphenidate 20 mg never really helped.     In the past, EDS was better when he wore CPAP but he can't wear he whole night.      Insomnia:   He has sleep maintenance insomnia > sleep onet (usually quick).    He takes melatonin at bedtime.   Cognitive:   He and his wife note some decreased attention and decrease executive function abilities.  He had a neglect after the stroke which appears to improve.  Mood:    He continues to feel depressed. He thinks this certainly may have helped slightly but not much. He gets irritable very easily. He denies anxiety.  REVIEW OF SYSTEMS:  Constitutional: No fevers, chills, sweats, or change in appetite.  He sleeps poorly.    He is very tired Eyes: No visual changes, double vision, eye pain Ear, nose and throat: No hearing loss, ear pain, nasal congestion, sore throat Cardiovascular: No chest pain, palpitations Respiratory:  No shortness of breath at rest or with exertion.   No wheezes GastrointestinaI: No nausea, vomiting, diarrhea, abdominal pain, fecal incontinence Genitourinary:  No dysuria, urinary retention or frequency.  No nocturia. Musculoskeletal:  No neck pain, back pain but he has shoulder and hip  pain Integumentary: No rash, pruritus, skin lesions Neurological: as above Psychiatric: No depression at this time.  No anxiety Endocrine: No palpitations, diaphoresis, change in appetite, change in weigh.  He notes feeling cold and increased thirst Hematologic/Lymphatic:  No anemia, purpura, petechiae. Allergic/Immunologic: No itchy/runny eyes, nasal congestion, recent allergic reactions, rashes  ALLERGIES: Allergies  Allergen Reactions  . Diphenhydramine Hcl Anaphylaxis  . Adhesive [Tape] Other (See Comments)    Tears skin - please use paper tape  . Proscar [Finasteride] Other (See Comments)    Lightheaded.   . Tamsulosin Other (See Comments)    Dizzy, vomiting.     HOME MEDICATIONS: Outpatient Medications Prior to Visit  Medication Sig Dispense Refill  . acetaminophen (TYLENOL) 325 MG tablet Take 1 tablet (325 mg total) by mouth every 6 (six) hours as needed for mild pain.    Marland Kitchen albuterol (PROVENTIL HFA;VENTOLIN HFA) 108 (90 Base) MCG/ACT inhaler Inhale 2 puffs into the lungs every 6 (six) hours as needed for wheezing or shortness of breath. 1 Inhaler 0  . apixaban (ELIQUIS) 5 MG TABS tablet Take 1 tablet (5 mg total) by mouth 2 (two) times daily. 180 tablet 3  . aspirin 81 MG EC tablet Take 1 tablet (81 mg total) by mouth daily. Swallow whole. 90 tablet 3  . cholecalciferol (VITAMIN D) 1000 units tablet Take 1,000 Units by mouth daily.    Marland Kitchen doxepin (SINEQUAN) 10 MG capsule Take 1 capsule (10 mg total) by mouth at bedtime. 30 capsule 11  . ferrous sulfate 325 (65 FE) MG tablet Take 1 tablet (325 mg total) by mouth 2 (two) times daily with a meal. 60 tablet 5  . fludrocortisone (FLORINEF) 0.1 MG tablet TAKE 1 TABLET BY MOUTH 2 TIMES DAILY. 60 tablet 3  . fluticasone (FLONASE) 50 MCG/ACT nasal spray Place 2 sprays into both nostrils daily.    Marland Kitchen gabapentin (NEURONTIN) 300 MG capsule Take 1 capsule (300 mg total) by mouth at bedtime. 90 capsule 1  . hydrocortisone (ANUSOL-HC) 25 MG  suppository Place 1 suppository (25 mg total) rectally 2 (two) times daily as needed. X 1 week; then, use as needed thereafter. 24 suppository 3  . Melatonin 10 MG CAPS Take 10 mg by mouth at bedtime as needed (sleep). 90 capsule 3  . midodrine (PROAMATINE) 2.5 MG tablet TAKE 1 TABLET BY MOUTH 2 TIMES DAILY WITH MEALS. 60 tablet 3  . nitroGLYCERIN (NITROSTAT) 0.4 MG SL tablet Place 1 tablet (0.4 mg total) under the tongue every 5 (five) minutes as needed for chest pain.    Marland Kitchen nystatin (MYCOSTATIN/NYSTOP) powder Apply topically 2 (two) times daily. 60 g 3  . pantoprazole (PROTONIX) 40 MG tablet TAKE 1 TABLET BY MOUTH 2 TIMES DAILY. 60 tablet 6  . potassium chloride (K-DUR) 10 MEQ tablet Take 2 tablets (20 mEq total) by mouth 2 (two) times daily.    Marland Kitchen PRESCRIPTION MEDICATION Inhale into the lungs at bedtime. CPAP    . prochlorperazine (COMPAZINE) 10 MG tablet Take 1 tablet (10 mg total) by mouth every 8 (eight) hours as needed (headache, nausea or vomiting). 10 tablet 0  . rosuvastatin (CRESTOR) 40 MG tablet Take 1 tablet (40 mg total) by mouth daily. 90 tablet 3  . senna (SENOKOT) 8.6 MG tablet Take 1 tablet by mouth 2 (two) times daily.    Marland Kitchen senna-docusate (SENNA S) 8.6-50 MG tablet Take 1 tablet by mouth 2 (two) times daily. 60 tablet prn  . sertraline (ZOLOFT) 100 MG tablet TAKE 1 TABLET BY MOUTH DAILY. 30 tablet 5  . tiZANidine (ZANAFLEX) 4 MG capsule Take 1 capsule (4 mg total) by mouth as needed. 90 capsule 3  . HYDROcodone-acetaminophen (NORCO/VICODIN) 5-325 MG tablet Take 1 tablet by mouth  daily as needed for moderate pain. 30 tablet 0   No facility-administered medications prior to visit.     PAST MEDICAL HISTORY: Past Medical History:  Diagnosis Date  . Adrenal insufficiency ([place])   . Anxiety   . Basal cell carcinoma of skin of left ankle   . Bleeding stomach ulcer 2015  . Coronary artery disease    stent, then CABG 07/20/10  . Daily headache    "for the past month" (11/11/2014)  .  Depression   . Diverticulosis   . GERD (gastroesophageal reflux disease)   . Gout   . H/O cardiomyopathy    ischemic, now last echo 07/30/11, EF 38%W  . History of hiatal hernia   . Hyperlipidemia   . Hypertension   . Internal hemorrhoids   . Left hemiplegia ([place])   . NSTEMI (non-ST elevated myocardial infarction) ([place])    NSTEMI- last cath 06/2011-Stent to LCX-DES, last nuc 07/30/11 low risk  . Obesity (BMI 30-39.9)   . OSA on CPAP    since July 2013- uses CPAP sometimes , states he has lost 147 lbs. since stroke & doesn't use the CPAP as much as he use to.   . Plantar fasciitis of left foot   . Pneumonia    hosp. 12/2014  . Pulmonary embolism ([place]) 03/2014  . Stroke ([place]) 01/2014   "no control of LUE, can slightly LLE; memory problems since" (11/11/2014)  . Tubular adenoma of colon     PAST SURGICAL HISTORY: Past Surgical History:  Procedure Laterality Date  . BASAL CELL CARCINOMA EXCISION Left 2015   ankle  . CARDIAC SURGERY    . CATARACT EXTRACTION Bilateral   . COLONOSCOPY N/A 02/10/2013   Procedure: COLONOSCOPY;  Surgeon: Ladene Artist, MD;  Location: [facility];  Service: Endoscopy;  Laterality: N/A;  . CORONARY ANGIOPLASTY WITH STENT PLACEMENT  07/01/2011   DES-Resolute to native LCX  . CORONARY ANGIOPLASTY WITH STENT PLACEMENT  12/01/2007   COMPLEX 5 LESION PCI INCLUDING CUTTING BALLOON AND 4 CYPHER DESs  . CORONARY ARTERY BYPASS GRAFT  07/20/2010    LIMA-LAD; VG-ACUTE MARG of RCA; Seq VG-distal RCA & then pda  . EP IMPLANTABLE DEVICE N/A 02/14/2016   Procedure: Loop Recorder Removal;  Surgeon: Thompson Grayer, MD;  Location: [facility];  Service: Cardiovascular;  Laterality: N/A;  . ESOPHAGOGASTRODUODENOSCOPY  01/2014   gastric ulcer, erosive gastroduodenitis, H Pylori negative.   . ESOPHAGOGASTRODUODENOSCOPY (EGD) WITH PROPOFOL N/A 11/14/2014   Procedure: ESOPHAGOGASTRODUODENOSCOPY (EGD) WITH PROPOFOL;  Surgeon: Milus Banister, MD;  Location: [facility];   Service: Endoscopy;  Laterality: N/A;  . HAND SURGERY Right    to take glass out  . HERNIA REPAIR    . INTRAMEDULLARY (IM) NAIL INTERTROCHANTERIC Right 02/07/2015   Procedure: INTRAMEDULLARY (IM) NAIL INTERTROCHANTRIC RIGHT HIP;  Surgeon: Renette Butters, MD;  Location: [facility];  Service: Orthopedics;  Laterality: Right;  . KNEE ARTHROSCOPY Right   . LEFT HEART CATHETERIZATION WITH CORONARY/GRAFT ANGIOGRAM  07/01/2011   Procedure: LEFT HEART CATHETERIZATION WITH Beatrix Fetters;  Surgeon: Sanda Klein, MD;  Location: [facility];  Service: Cardiovascular;;  . LOOP RECORDER IMPLANT  02/15/2014   MDT LINQ implanted by Dr Rayann Heman for cryptogenic stroke  . PERCUTANEOUS CORONARY STENT INTERVENTION (PCI-S) Right 07/01/2011   Procedure: PERCUTANEOUS CORONARY STENT INTERVENTION (PCI-S);  Surgeon: Sanda Klein, MD;  Location: [facility];  Service: Cardiovascular;  Laterality: Right;  . PICC LINE PLACE PERIPHERAL ([place] HX)  06/2015  .  RETINAL DETACHMENT SURGERY Right   . TEE WITHOUT CARDIOVERSION N/A 02/15/2014   Procedure: TRANSESOPHAGEAL ECHOCARDIOGRAM (TEE);  Surgeon: Josue Hector, MD;  Location: [facility];  Service: Cardiovascular;  Laterality: N/A;  . UMBILICAL HERNIA REPAIR  2006    FAMILY HISTORY: Family History  Problem Relation Age of Onset  . Diabetes type II Mother   . CAD Father 72       CABG, PCI, died at 63  . Heart attack Father 45  . Hypertension Brother   . Diabetes Brother   . Hyperlipidemia Brother   . Brain cancer Maternal Grandmother   . COPD Paternal Grandfather   . Coronary artery disease Unknown   . Other Unknown        denies family history of DVT/PE  . Colon cancer Neg Hx   . Stroke Neg Hx   . Prostate cancer Neg Hx     SOCIAL HISTORY:  Social History   Socioeconomic History  . Marital status: Married    Spouse name: Not on file  . Number of children: 5  . Years of education: Not on file  . Highest education level: Not on file    Occupational History    Employer: [employer]    Comment: works at the [employer]  . Financial resource strain: Not on file  . Food insecurity:    Worry: Not on file    Inability: Not on file  . Transportation needs:    Medical: Not on file    Non-medical: Not on file  Tobacco Use  . Smoking status: Former Smoker    Packs/day: 2.00    Years: 4.00    Pack years: 8.00    Types: Cigarettes    Last attempt to quit: 01/30/1969    Years since quitting: 48.5  . Smokeless tobacco: Never Used  . Tobacco comment: "quit smoking cigarettes  in the 1970's"  Substance and Sexual Activity  . Alcohol use: No    Alcohol/week: 0.0 oz  . Drug use: No    Comment: "smoked some pot in college"  . Sexual activity: Not Currently  Lifestyle  . Physical activity:    Days per week: Not on file    Minutes per session: Not on file  . Stress: Not on file  Relationships  . Social connections:    Talks on phone: Not on file    Gets together: Not on file    Attends religious service: Not on file    Active member of club or organization: Not on file    Attends meetings of clubs or organizations: Not on file    Relationship status: Not on file  . Intimate partner violence:    Fear of current or ex partner: Not on file    Emotionally abused: Not on file    Physically abused: Not on file    Forced sexual activity: Not on file  Other Topics Concern  . Not on file  Social History Narrative   Quit smoking 40 years ago   Married 1996   Likes to play guitar and sing with church group unable since 02/12/14 stroke    Gregory Wiley 1970-1972, [place], no service related issues.       PHYSICAL EXAM  Vitals:   08/06/17 1519  BP: 105/68  Pulse: 68  Resp: 20    There is no height or weight on file to calculate BMI.   General: The patient is well-developed and well-nourished and in no acute  distress.  He is in a wheelchair.  Neck:  There are no carotid bruits. The neck is mildly tender in  the lower cervical paraspinal region and the trapezius muscles..     Skin: He has mild edema at the left ankle.  Neurologic Exam  Mental status: The patient is alert and oriented x 3 at the time of the examination. The patient has apparent normal recent and remote memory. Attention is mildly reduced.   Speech is normal.   There is no neglect.  Cranial nerves: Extraocular movements are full.  Facial strength and sensation is normal.  Trapezius strength is normal.. No dysarthria is noted.  No obvious hearing deficits are noted.  Motor:  Muscle bulk is normal. Muscle tone is increased on the left.. There is severe spasticity in the left arm and leg..  Strength is 5/5 on the right. Strength is 2/5 in left shoulder elevation and 1/5 in proximal arm strength. Strength is 2/5 in the quadriceps and 2-/5 in other proximal muscles of the left leg  Sensory: Sensory testing is intact to touch and vibration duration sensation    Coordination:  Finger-nose-finger and heel-to-shin is performed well on the right but he cannot do either of these on the left  Gait and station: He needs support to stand or take a few steps  Reflexes: Deep tendon reflexes are increased on the left.Marland Kitchen      DIAGNOSTIC DATA (LABS, IMAGING, TESTING) - I reviewed patient records, labs, notes, testing and imaging myself where available.  Lab Results  Component Value Date   WBC 5.0 07/18/2017   HGB 12.5 (L) 07/18/2017   HCT 38.9 (L) 07/18/2017   MCV 82.9 07/18/2017   PLT 135.0 (L) 07/18/2017      Component Value Date/Time   NA 141 07/18/2017 1329   K 4.0 07/18/2017 1329   CL 106 07/18/2017 1329   CO2 29 07/18/2017 1329   GLUCOSE 91 07/18/2017 1329   BUN 12 07/18/2017 1329   CREATININE 1.12 07/18/2017 1329   CREATININE 0.99 08/11/2015 1528   CALCIUM 8.6 07/18/2017 1329   PROT 6.7 07/18/2017 1329   ALBUMIN 3.4 (L) 07/18/2017 1329   AST 14 07/18/2017 1329   ALT 8 07/18/2017 1329   ALKPHOS 64 07/18/2017 1329    BILITOT 0.5 07/18/2017 1329   GFRNONAA >60 05/06/2017 1910   GFRNONAA >89 09/13/2014 1032   GFRAA >60 05/06/2017 1910   GFRAA >89 09/13/2014 1032   Lab Results  Component Value Date   CHOL 113 08/30/2016   HDL 48.10 08/30/2016   LDLCALC 57 08/30/2016   TRIG 40.0 08/30/2016   CHOLHDL 2 08/30/2016   Lab Results  Component Value Date   HGBA1C 5.6 02/13/2014   Lab Results  Component Value Date   VITAMINB12 286 07/05/2015   Lab Results  Component Value Date   TSH 1.035 12/23/2014        ASSESSMENT AND PLAN  Hemiplegia affecting left side in left-dominant patient as late effect of cerebrovascular disease (HCC)  OSA (obstructive sleep apnea)  Chronic anticoagulation    1.    Continue Eliquis for anticoagulation.. 2.    Use 4 prong cane around the house.  3.    Taper  Zoloft off over a couple weeks. Renew hydrocodone (he just takes 1-2 a week on average) 4.    Continue regular use of CPAP.  5.    Take tizanidine at bedtime. 6.     He will return to see me in  5-6 months or sooner if he has new or worsening neurologic symptoms.   Kenetra Hildenbrand A. Felecia Shelling, MD, PhD 7/41/4239, 5:32 PM Certified in Neurology, Clinical Neurophysiology, Sleep Medicine, Pain Medicine and Neuroimaging  [facility] [address] [place], [place] [address] [phone]

## 2017-08-07 ENCOUNTER — Encounter: Payer: Self-pay | Admitting: Family Medicine

## 2017-08-07 ENCOUNTER — Other Ambulatory Visit: Payer: Medicare Other

## 2017-08-07 ENCOUNTER — Ambulatory Visit (INDEPENDENT_AMBULATORY_CARE_PROVIDER_SITE_OTHER): Payer: Medicare Other | Admitting: Family Medicine

## 2017-08-07 ENCOUNTER — Ambulatory Visit (INDEPENDENT_AMBULATORY_CARE_PROVIDER_SITE_OTHER)
Admission: RE | Admit: 2017-08-07 | Discharge: 2017-08-07 | Disposition: A | Discharge status: Home / Self Care | Payer: Medicare Other | Source: Ambulatory Visit | Attending: Family Medicine | Admitting: Family Medicine

## 2017-08-07 VITALS — BP 116/71 | HR 61 | Temp 98.0°F | Ht 72.0 in

## 2017-08-07 DIAGNOSIS — R9389 Abnormal findings on diagnostic imaging of other specified body structures: Secondary | ICD-10-CM | POA: Diagnosis not present

## 2017-08-07 DIAGNOSIS — E559 Vitamin D deficiency, unspecified: Secondary | ICD-10-CM | POA: Diagnosis not present

## 2017-08-07 DIAGNOSIS — D509 Iron deficiency anemia, unspecified: Secondary | ICD-10-CM

## 2017-08-07 DIAGNOSIS — I959 Hypotension, unspecified: Secondary | ICD-10-CM | POA: Diagnosis not present

## 2017-08-07 DIAGNOSIS — R0602 Shortness of breath: Secondary | ICD-10-CM | POA: Diagnosis not present

## 2017-08-07 LAB — BASIC METABOLIC PANEL
BUN: 19 mg/dL (ref 6–23)
CO2: 29 mEq/L (ref 19–32)
Calcium: 8.9 mg/dL (ref 8.4–10.5)
Chloride: 105 mEq/L (ref 96–112)
Creatinine, Ser: 0.98 mg/dL (ref 0.40–1.50)
GFR: 80.93 mL/min (ref >60.00)
Glucose, Bld: 90 mg/dL (ref 70–99)
Potassium: 4.3 mEq/L (ref 3.5–5.1)
Sodium: 139 mEq/L (ref 135–145)

## 2017-08-07 LAB — CBC WITH DIFFERENTIAL/PLATELET
Basophils Absolute: 0 10*3/uL (ref 0.0–0.1)
Basophils Relative: 0.8 % (ref 0.0–3.0)
Eosinophils Absolute: 0.4 10*3/uL (ref 0.0–0.7)
Eosinophils Relative: 8.3 % — ABNORMAL HIGH (ref 0.0–5.0)
HCT: 36.8 % — ABNORMAL LOW (ref 39.0–52.0)
Hemoglobin: 11.9 g/dL — ABNORMAL LOW (ref 13.0–17.0)
Lymphocytes Relative: 18.4 % (ref 12.0–46.0)
Lymphs Abs: 0.9 10*3/uL (ref 0.7–4.0)
MCHC: 32.2 g/dL (ref 30.0–36.0)
MCV: 83.9 fl (ref 78.0–100.0)
Monocytes Absolute: 0.3 10*3/uL (ref 0.1–1.0)
Monocytes Relative: 6.5 % (ref 3.0–12.0)
Neutro Abs: 3.4 10*3/uL (ref 1.4–7.7)
Neutrophils Relative %: 66 % (ref 43.0–77.0)
Platelets: 140 10*3/uL — ABNORMAL LOW (ref 150.0–400.0)
RBC: 4.39 Mil/uL (ref 4.22–5.81)
RDW: 17.4 % — ABNORMAL HIGH (ref 11.5–15.5)
WBC: 5.1 10*3/uL (ref 4.0–10.5)

## 2017-08-07 LAB — IBC PANEL
Iron: 31 ug/dL — ABNORMAL LOW (ref 42–165)
Saturation Ratios: 9.5 % — ABNORMAL LOW (ref 20.0–50.0)
Transferrin: 233 mg/dL (ref 212.0–360.0)

## 2017-08-07 LAB — VITAMIN D 25 HYDROXY (VIT D DEFICIENCY, FRACTURES): VITD: 41.23 ng/mL (ref 30.00–100.00)

## 2017-08-07 LAB — BRAIN NATRIURETIC PEPTIDE: Pro B Natriuretic peptide (BNP): 465 pg/mL — ABNORMAL HIGH (ref 0.0–100.0)

## 2017-08-07 MED ORDER — FLUDROCORTISONE ACETATE 0.1 MG PO TABS: ORAL_TABLET | ORAL | Status: DC

## 2017-08-07 MED ORDER — SERTRALINE HCL 100 MG PO TABS: 50.0000 mg | ORAL_TABLET | Freq: Every day | ORAL | Status: DC

## 2017-08-07 NOTE — Progress Notes (Signed)
He is in the midst of sertraline taper so that he'll not quit cold [place].  This was advised per neuro.  Sertraline may be contributing to some of his dry mouth symptoms.  History of low blood pressure. We had previously talked about trying to taper his Florinef.   He is taking florinef once a day.  Swelling isn't better on lower dose of florinef.  They have been checking his blood pressure at home and on their cuff at home it usually is 120s to 130s over 70s to 80s.    He is SOB supine.  He didn't tolerate albuterol prev.  He has known pleural effusions.  He had previous abnormal CT chest.  He is due for follow-up imaging today.  Incidentally, it is noted that he has been having watery diarrhea in the last week.  He is still fatigued. He has h/o vit D def.    Prev CT IMPRESSION: 1. Lung opacity in the right middle and lower lobes highlighted on 05/15/2017 abdominal CT have regressed, compatible with an inflammatory process. 2. Moderate bilateral pleural effusion that have increased from comparison exam. 3. Patchy airspace opacity in the bilateral lungs, favored inflammatory. Please correlate for pneumonia. Followup PA and lateral chest X-ray is recommended in 3-4 weeks to ensure Resolution.  PMH and SH reviewed  ROS: Per HPI unless specifically indicated in ROS section   Meds, vitals, and allergies reviewed.   GEN: nad, alert and oriented, in wheelchair, left hemiplegia at baseline. HEENT: mucous membranes moist NECK: supple w/o LA CV: rrr. PULM: Decreased breath sounds at the bilateral bases but otherwise Ctab, no inc wob ABD: soft, +bs EXT: 1+ BLE edema SKIN: no acute rash

## 2017-08-07 NOTE — Patient Instructions (Signed)
Go to the lab on the way out.  We'll contact you with your lab and xray report. Don't change your meds for now.  It may be reasonable to continue to taper the florinef but continue with 1 pill a day for now.  Take care.  Glad to see you.

## 2017-08-08 NOTE — Assessment & Plan Note (Signed)
He still has shortness of breath when he is supine.  Recheck BNP today along with chest x-ray and routine labs.  Unclear how much the pleural effusions contribute to shortness of breath.  Unclear cause of pleural effusions.  Discussed with patient.  I am not enthused about him having tap of pleural effusion given his anticoagulation.  See notes on imaging.  No change in medications at this point. >25 minutes spent in face to face time with patient, >50% spent in counselling or coordination of care.

## 2017-08-08 NOTE — Assessment & Plan Note (Signed)
Recheck labs today.  See notes on labs. 

## 2017-08-08 NOTE — Assessment & Plan Note (Signed)
Recheck pending.  See notes on labs. 

## 2017-08-08 NOTE — Assessment & Plan Note (Addendum)
We tried tapering his Florinef to see if that would help with peripheral edema.  He still has 1+ bilateral lower extremity edema.  We did not change his medication at the office visit.  Recheck routine labs today.  It may be that he can tolerate full taper off of medication.  Recheck blood pressure on our cuff was 110/70.  Recheck on his cuff was 116/71.

## 2017-08-20 ENCOUNTER — Telehealth: Payer: Self-pay | Admitting: *Deleted

## 2017-08-20 NOTE — Telephone Encounter (Signed)
PA for Eliquis submitted via covermymeds.  [id number] Status:Approved

## 2017-08-24 ENCOUNTER — Telehealth: Payer: Self-pay | Admitting: Family Medicine

## 2017-08-24 NOTE — Telephone Encounter (Signed)
Please check on patient.  I want to make plans about rechecking labs.  Part of this depends on how he is doing off the Florinef.  Was he able to stop that medication?  How is he doing?  Let me know.  Thanks.

## 2017-08-25 NOTE — Telephone Encounter (Signed)
Left detailed message on voicemail to call in with a report on medication and an update on condition.

## 2017-08-25 NOTE — Telephone Encounter (Signed)
Patient says they have stopped the Florinef and his blood pressure is holding steady.  Please advise.

## 2017-08-26 NOTE — Addendum Note (Signed)
Addended by: Tonia Ghent on: 08/26/2017 06:48 AM   Modules accepted: Orders

## 2017-08-26 NOTE — Telephone Encounter (Signed)
Wife advised, appointment scheduled.

## 2017-08-26 NOTE — Telephone Encounter (Signed)
Would try recheck in 2 months.  We can do labs at that visit.  30-minute appointment.  If swelling or shortness of breath is worse in the meantime then let me know.  Thanks.

## 2017-09-11 ENCOUNTER — Other Ambulatory Visit: Payer: Self-pay | Admitting: Family Medicine

## 2017-09-11 ENCOUNTER — Other Ambulatory Visit: Payer: Self-pay | Admitting: Neurology

## 2017-09-11 NOTE — Telephone Encounter (Signed)
Electronic refill request. Midodrine Last office visit:   08/07/17 Last Filled:    60 tablet 3 04/17/2017  Please advise.

## 2017-09-11 NOTE — Telephone Encounter (Signed)
Sent. Thanks.   

## 2017-10-28 ENCOUNTER — Ambulatory Visit (INDEPENDENT_AMBULATORY_CARE_PROVIDER_SITE_OTHER): Payer: Medicare Other | Admitting: Family Medicine

## 2017-10-28 ENCOUNTER — Encounter: Payer: Self-pay | Admitting: Family Medicine

## 2017-10-28 VITALS — BP 100/70 | HR 76 | Temp 97.5°F | Ht 72.0 in | Wt 232.5 lb

## 2017-10-28 DIAGNOSIS — F329 Major depressive disorder, single episode, unspecified: Secondary | ICD-10-CM

## 2017-10-28 DIAGNOSIS — R0602 Shortness of breath: Secondary | ICD-10-CM

## 2017-10-28 DIAGNOSIS — I959 Hypotension, unspecified: Secondary | ICD-10-CM

## 2017-10-28 DIAGNOSIS — D509 Iron deficiency anemia, unspecified: Secondary | ICD-10-CM

## 2017-10-28 DIAGNOSIS — F32A Depression, unspecified: Secondary | ICD-10-CM

## 2017-10-28 MED ORDER — SERTRALINE HCL 50 MG PO TABS: 50.0000 mg | ORAL_TABLET | Freq: Every day | ORAL | 3 refills | Status: DC

## 2017-10-28 NOTE — Progress Notes (Signed)
He tapered off florinef.   He has less L hand swelling but still some L foot swelling. He couldn't use compression stockings easily.  Still on midodrine at baseline.   He has a sensation of needle stinging his L foot at night, this is at baseline.   He tapered off zoloft but is more irritable in the meantime.  He has more emotional outbursts off med.  His "mental fog" didn't get better off med.  Wife agreed that he was more irritable off medication.  He has less SOB than prev.  He had some missed doses of iron recently, d/w pt about options for med adherence, bowel regimen, to make it easier to take iron.  No hemorrhoid bleeding now.    He is still using nasal CPAP.  He has some rhinorrhea that could be related to that.  D/w pt about keeping mask clean.    Meds, vitals, and allergies reviewed.   ROS: Per HPI unless specifically indicated in ROS section   GEN: nad, alert and oriented, in wheelchair.  Left-sided weakness at baseline. HEENT: mucous membranes moist NECK: supple w/o LA CV: rrr. PULM: ctab, no inc wob ABD: soft, +bs EXT: no edema SKIN: no acute rash

## 2017-10-28 NOTE — Patient Instructions (Addendum)
Go to the lab on the way out.  We'll contact you with your lab report. Restart the sertraline.   Update me as needed.   We'll see about when to get together again when I see your labs from today.  Please get me a copy of the outside labs.   Take care.  Glad to see you.

## 2017-10-29 LAB — COMPREHENSIVE METABOLIC PANEL
ALT: 10 U/L (ref 0–53)
AST: 15 U/L (ref 0–37)
Albumin: 4 g/dL (ref 3.5–5.2)
Alkaline Phosphatase: 49 U/L (ref 39–117)
BUN: 21 mg/dL (ref 6–23)
CO2: 28 mEq/L (ref 19–32)
Calcium: 9.7 mg/dL (ref 8.4–10.5)
Chloride: 104 mEq/L (ref 96–112)
Creatinine, Ser: 1.18 mg/dL (ref 0.40–1.50)
GFR: 65.27 mL/min (ref >60.00)
Glucose, Bld: 91 mg/dL (ref 70–99)
Potassium: 4.6 mEq/L (ref 3.5–5.1)
Sodium: 139 mEq/L (ref 135–145)
Total Bilirubin: 0.6 mg/dL (ref 0.2–1.2)
Total Protein: 7.5 g/dL (ref 6.0–8.3)

## 2017-10-29 LAB — CBC WITH DIFFERENTIAL/PLATELET
Basophils Absolute: 0 10*3/uL (ref 0.0–0.1)
Basophils Relative: 0.8 % (ref 0.0–3.0)
Eosinophils Absolute: 0.4 10*3/uL (ref 0.0–0.7)
Eosinophils Relative: 7.3 % — ABNORMAL HIGH (ref 0.0–5.0)
HCT: 43.3 % (ref 39.0–52.0)
Hemoglobin: 14.4 g/dL (ref 13.0–17.0)
Lymphocytes Relative: 25.3 % (ref 12.0–46.0)
Lymphs Abs: 1.4 10*3/uL (ref 0.7–4.0)
MCHC: 33.3 g/dL (ref 30.0–36.0)
MCV: 86.2 fl (ref 78.0–100.0)
Monocytes Absolute: 0.4 10*3/uL (ref 0.1–1.0)
Monocytes Relative: 7 % (ref 3.0–12.0)
Neutro Abs: 3.4 10*3/uL (ref 1.4–7.7)
Neutrophils Relative %: 59.6 % (ref 43.0–77.0)
Platelets: 166 10*3/uL (ref 150.0–400.0)
RBC: 5.03 Mil/uL (ref 4.22–5.81)
RDW: 14.7 % (ref 11.5–15.5)
WBC: 5.6 10*3/uL (ref 4.0–10.5)

## 2017-10-29 LAB — IBC PANEL
Iron: 66 ug/dL (ref 42–165)
Saturation Ratios: 19.6 % — ABNORMAL LOW (ref 20.0–50.0)
Transferrin: 240 mg/dL (ref 212.0–360.0)

## 2017-10-29 LAB — BRAIN NATRIURETIC PEPTIDE: Pro B Natriuretic peptide (BNP): 73 pg/mL (ref 0.0–100.0)

## 2017-10-30 NOTE — Assessment & Plan Note (Signed)
Reasonable to recheck baseline labs.  See notes on labs.

## 2017-10-30 NOTE — Assessment & Plan Note (Signed)
He is still on Midrin.  He tapered off Florinef.  Swelling is some better in the meantime.  Reasonable to recheck routine labs today.  He has not been able to tolerate compression stockings.  Less short of breath than previous.  Reasonable to recheck baseline labs.  See notes on labs. >25 minutes spent in face to face time with patient, >50% spent in counselling or coordination of care.

## 2017-10-30 NOTE — Assessment & Plan Note (Signed)
He was more irritable off Zoloft.  Restart medication at 50 mg a day.  Still okay for outpatient follow-up.  He will update me as needed.

## 2017-11-10 ENCOUNTER — Encounter: Payer: Self-pay | Admitting: Family Medicine

## 2017-11-10 LAB — LAB REPORT - SCANNED: LDL (calc): 69

## 2017-11-14 ENCOUNTER — Telehealth: Payer: Self-pay | Admitting: Neurology

## 2017-11-14 NOTE — Telephone Encounter (Signed)
Pt wife(on [facility],Laura @[phone]) has requested a refill for HYDROcodone-acetaminophen (NORCO/VICODIN) 5-325 MG tablet

## 2017-11-18 MED ORDER — HYDROCODONE-ACETAMINOPHEN 5-325 MG PO TABS: 1.0000 | ORAL_TABLET | Freq: Every day | ORAL | 0 refills | Status: DC | PRN

## 2017-11-18 NOTE — Telephone Encounter (Signed)
Rx. awaiting RAS sig/fim 

## 2017-11-18 NOTE — Addendum Note (Signed)
Addended by: France Ravens I on: 11/18/2017 09:10 AM   Modules accepted: Orders

## 2017-11-18 NOTE — Telephone Encounter (Signed)
Rx. up front [facility]/fim 

## 2017-12-10 ENCOUNTER — Other Ambulatory Visit: Payer: Self-pay | Admitting: Cardiovascular Disease

## 2017-12-10 ENCOUNTER — Other Ambulatory Visit: Payer: Self-pay | Admitting: Family Medicine

## 2017-12-10 NOTE — Telephone Encounter (Signed)
Electronic refill request Last office visit 10/28/17 Melatonin last refill 11/28/16 #90/1 Potassium refill request does not match medication, please verify directions

## 2017-12-11 NOTE — Telephone Encounter (Signed)
Sig checked.  Sent.  Thanks.

## 2018-01-15 ENCOUNTER — Other Ambulatory Visit: Payer: Self-pay | Admitting: Family Medicine

## 2018-01-29 ENCOUNTER — Ambulatory Visit (INDEPENDENT_AMBULATORY_CARE_PROVIDER_SITE_OTHER): Payer: Medicare Other | Admitting: Family Medicine

## 2018-01-29 ENCOUNTER — Encounter: Payer: Self-pay | Admitting: Family Medicine

## 2018-01-29 VITALS — BP 122/74 | HR 81 | Temp 98.3°F | Ht 72.0 in | Wt 244.8 lb

## 2018-01-29 DIAGNOSIS — I959 Hypotension, unspecified: Secondary | ICD-10-CM

## 2018-01-29 DIAGNOSIS — J069 Acute upper respiratory infection, unspecified: Secondary | ICD-10-CM

## 2018-01-29 DIAGNOSIS — F32A Depression, unspecified: Secondary | ICD-10-CM

## 2018-01-29 DIAGNOSIS — F329 Major depressive disorder, single episode, unspecified: Secondary | ICD-10-CM | POA: Diagnosis not present

## 2018-01-29 DIAGNOSIS — I63411 Cerebral infarction due to embolism of right middle cerebral artery: Secondary | ICD-10-CM

## 2018-01-29 DIAGNOSIS — D509 Iron deficiency anemia, unspecified: Secondary | ICD-10-CM

## 2018-01-29 DIAGNOSIS — I5042 Chronic combined systolic (congestive) and diastolic (congestive) heart failure: Secondary | ICD-10-CM

## 2018-01-29 MED ORDER — DOXYCYCLINE HYCLATE 100 MG PO TABS: 100.0000 mg | ORAL_TABLET | Freq: Two times a day (BID) | ORAL | 0 refills | Status: DC

## 2018-01-29 NOTE — Patient Instructions (Signed)
Go to the lab on the way out.  We'll contact you with your lab report. Defer flu shot for now.  If you have continued discolored runny nose, then start doxycycline.  Plan on recheck in about 3 months.  Don't change your meds for now.  Take care.  Glad to see you.

## 2018-01-29 NOTE — Progress Notes (Signed)
Weight is up.  His diet/activity has been off.  He didn't have sudden change in weight, it was more of a gradual change.  He has some L foot puffiness but this is not an acute issue.  Due for recheck K.  Off florinef.  Still on Midrin.  Off zoloft with some dec in irritability.  He is able to smile and chuckle today.  This is an improvement from his previous time off Zoloft.  He reports that he has "fuzziness" that slightly slows or clouds his thinking episodically.  This is been present ever since he had his stroke and I suspect that it is actually related to the stroke itself.  He does not have any acute changes.  We discussed.  He was asking about options.  He is taking moringa oleifera in the meantime.  We discussed.  I do not have evidence that it is helpful.  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  URI sx for the last week.  No fevers.  Bloody/green discharge.  No ear pain.  Some facial pain, maxillary.  Some cough, more in the AM.  No wheeze.  No vomiting.  No aches.  Not SOB supine.  Flu shot discussed.    Still on iron replacement for h/o iron def anemia. No CP.  No SOB.  Some bleeding from hemorrhoid but not o/w.  No black stools.  He was not at all enthused about colonoscopy.  He declined colonoscopy.  I agree with the patient given his previous issues and the need to stop anticoagulation for the procedure, in addition to the risk of the procedure itself, in addition to the difficulty he would have with the prep given his hemiparesis.  PMH and SH reviewed  ROS: Per HPI unless specifically indicated in ROS section   Meds, vitals, and allergies reviewed.   GEN: nad, alert and oriented HEENT: mucous membranes moist, tm w/o erythema, nasal exam w/o erythema, clear discharge noted,  OP with cobblestoning, sinuses not tender to palpation NECK: supple w/o LA CV: rrr.   PULM: ctab, no inc wob EXT: Left ankle puffy at baseline SKIN: no acute rash Left hemiparesis noted at  baseline

## 2018-01-30 LAB — CBC WITH DIFFERENTIAL/PLATELET
Basophils Absolute: 0.1 10*3/uL (ref 0.0–0.1)
Basophils Relative: 1.2 % (ref 0.0–3.0)
Eosinophils Absolute: 0.2 10*3/uL (ref 0.0–0.7)
Eosinophils Relative: 2.6 % (ref 0.0–5.0)
HCT: 43.8 % (ref 39.0–52.0)
Hemoglobin: 14.5 g/dL (ref 13.0–17.0)
Lymphocytes Relative: 22.5 % (ref 12.0–46.0)
Lymphs Abs: 1.3 10*3/uL (ref 0.7–4.0)
MCHC: 33.1 g/dL (ref 30.0–36.0)
MCV: 88.5 fl (ref 78.0–100.0)
Monocytes Absolute: 0.5 10*3/uL (ref 0.1–1.0)
Monocytes Relative: 8.6 % (ref 3.0–12.0)
Neutro Abs: 3.8 10*3/uL (ref 1.4–7.7)
Neutrophils Relative %: 65.1 % (ref 43.0–77.0)
Platelets: 181 10*3/uL (ref 150.0–400.0)
RBC: 4.95 Mil/uL (ref 4.22–5.81)
RDW: 13.6 % (ref 11.5–15.5)
WBC: 5.8 10*3/uL (ref 4.0–10.5)

## 2018-01-30 LAB — COMPREHENSIVE METABOLIC PANEL
ALT: 15 U/L (ref 0–53)
AST: 21 U/L (ref 0–37)
Albumin: 3.8 g/dL (ref 3.5–5.2)
Alkaline Phosphatase: 57 U/L (ref 39–117)
BUN: 19 mg/dL (ref 6–23)
CO2: 28 mEq/L (ref 19–32)
Calcium: 9.1 mg/dL (ref 8.4–10.5)
Chloride: 104 mEq/L (ref 96–112)
Creatinine, Ser: 1.17 mg/dL (ref 0.40–1.50)
GFR: 65.87 mL/min (ref >60.00)
Glucose, Bld: 148 mg/dL — ABNORMAL HIGH (ref 70–99)
Potassium: 4.1 mEq/L (ref 3.5–5.1)
Sodium: 140 mEq/L (ref 135–145)
Total Bilirubin: 0.5 mg/dL (ref 0.2–1.2)
Total Protein: 7.4 g/dL (ref 6.0–8.3)

## 2018-01-30 LAB — IBC PANEL
Iron: <10 ug/dL — ABNORMAL LOW (ref 42–165)
Saturation Ratios: 3.6 % — ABNORMAL LOW (ref 20.0–50.0)
Transferrin: 198 mg/dL — ABNORMAL LOW (ref 212.0–360.0)

## 2018-02-02 ENCOUNTER — Other Ambulatory Visit: Payer: Self-pay | Admitting: Family Medicine

## 2018-02-02 DIAGNOSIS — D509 Iron deficiency anemia, unspecified: Secondary | ICD-10-CM

## 2018-02-02 DIAGNOSIS — J069 Acute upper respiratory infection, unspecified: Secondary | ICD-10-CM | POA: Insufficient documentation

## 2018-02-02 NOTE — Assessment & Plan Note (Signed)
Lungs are clear.  Okay for outpatient follow-up.  If he continues to have discolored nasal discharge then start doxycycline given the duration of symptoms.  Update me as needed.  He agrees.

## 2018-02-02 NOTE — Assessment & Plan Note (Signed)
He is not at all enthused about colonoscopy.  I agree with the patient.  See above.  Recheck routine labs today.  It may be safest and most appropriate for the patient to continue Eliquis twice a day along with iron twice a day.  If he is able to maintain his iron stores without having difficulties otherwise, this may be the safest and best option.  Discussed.  He agrees. >25 minutes spent in face to face time with patient, >50% spent in counselling or coordination of care.

## 2018-02-02 NOTE — Assessment & Plan Note (Signed)
I think that some of his slowing in mentation that has been noted since the stroke is due to the stroke itself.  Discussed.  I would continue his medication as is.

## 2018-02-02 NOTE — Assessment & Plan Note (Signed)
Mood is improved with Zoloft.  Continue as is.  He agrees.

## 2018-02-02 NOTE — Assessment & Plan Note (Signed)
See notes on labs.  We will see if he can eventually taper midodrine.

## 2018-02-02 NOTE — Assessment & Plan Note (Signed)
He does not appear grossly fluid overloaded at this point.  I think some of the increased weight is due to caloric imbalance with decrease in activity over the last few months.  Recheck routine labs today.

## 2018-02-18 ENCOUNTER — Ambulatory Visit: Payer: Medicare Other | Admitting: Neurology

## 2018-03-13 ENCOUNTER — Other Ambulatory Visit (INDEPENDENT_AMBULATORY_CARE_PROVIDER_SITE_OTHER): Payer: Medicare Other

## 2018-03-13 ENCOUNTER — Other Ambulatory Visit: Payer: Self-pay | Admitting: Family Medicine

## 2018-03-13 DIAGNOSIS — D509 Iron deficiency anemia, unspecified: Secondary | ICD-10-CM

## 2018-03-13 LAB — CBC WITH DIFFERENTIAL/PLATELET
Basophils Absolute: 0 10*3/uL (ref 0.0–0.1)
Basophils Relative: 0.9 % (ref 0.0–3.0)
Eosinophils Absolute: 0.8 10*3/uL — ABNORMAL HIGH (ref 0.0–0.7)
Eosinophils Relative: 13.9 % — ABNORMAL HIGH (ref 0.0–5.0)
HCT: 43.7 % (ref 39.0–52.0)
Hemoglobin: 14.7 g/dL (ref 13.0–17.0)
Lymphocytes Relative: 22.2 % (ref 12.0–46.0)
Lymphs Abs: 1.2 10*3/uL (ref 0.7–4.0)
MCHC: 33.6 g/dL (ref 30.0–36.0)
MCV: 89.6 fl (ref 78.0–100.0)
Monocytes Absolute: 0.4 10*3/uL (ref 0.1–1.0)
Monocytes Relative: 6.4 % (ref 3.0–12.0)
Neutro Abs: 3.1 10*3/uL (ref 1.4–7.7)
Neutrophils Relative %: 56.6 % (ref 43.0–77.0)
Platelets: 150 10*3/uL (ref 150.0–400.0)
RBC: 4.88 Mil/uL (ref 4.22–5.81)
RDW: 14.4 % (ref 11.5–15.5)
WBC: 5.5 10*3/uL (ref 4.0–10.5)

## 2018-03-13 LAB — COMPREHENSIVE METABOLIC PANEL
ALT: 21 U/L (ref 0–53)
AST: 20 U/L (ref 0–37)
Albumin: 3.9 g/dL (ref 3.5–5.2)
Alkaline Phosphatase: 57 U/L (ref 39–117)
BUN: 25 mg/dL — ABNORMAL HIGH (ref 6–23)
CO2: 27 mEq/L (ref 19–32)
Calcium: 9 mg/dL (ref 8.4–10.5)
Chloride: 106 mEq/L (ref 96–112)
Creatinine, Ser: 0.98 mg/dL (ref 0.40–1.50)
GFR: 80.78 mL/min (ref >60.00)
Glucose, Bld: 90 mg/dL (ref 70–99)
Potassium: 4.2 mEq/L (ref 3.5–5.1)
Sodium: 140 mEq/L (ref 135–145)
Total Bilirubin: 0.5 mg/dL (ref 0.2–1.2)
Total Protein: 7 g/dL (ref 6.0–8.3)

## 2018-03-13 LAB — IBC PANEL
Iron: 69 ug/dL (ref 42–165)
Saturation Ratios: 25.5 % (ref 20.0–50.0)
Transferrin: 193 mg/dL — ABNORMAL LOW (ref 212.0–360.0)

## 2018-03-22 ENCOUNTER — Encounter: Payer: Self-pay | Admitting: Gastroenterology

## 2018-04-09 ENCOUNTER — Other Ambulatory Visit: Payer: Self-pay

## 2018-04-09 ENCOUNTER — Encounter: Payer: Self-pay | Admitting: Neurology

## 2018-04-09 ENCOUNTER — Ambulatory Visit (INDEPENDENT_AMBULATORY_CARE_PROVIDER_SITE_OTHER): Payer: Medicare Other | Admitting: Neurology

## 2018-04-09 VITALS — BP 116/75 | HR 65 | Ht 72.0 in | Wt 258.5 lb

## 2018-04-09 DIAGNOSIS — I63411 Cerebral infarction due to embolism of right middle cerebral artery: Secondary | ICD-10-CM | POA: Diagnosis not present

## 2018-04-09 DIAGNOSIS — I69352 Hemiplegia and hemiparesis following cerebral infarction affecting left dominant side: Secondary | ICD-10-CM

## 2018-04-09 DIAGNOSIS — G4719 Other hypersomnia: Secondary | ICD-10-CM

## 2018-04-09 DIAGNOSIS — G4733 Obstructive sleep apnea (adult) (pediatric): Secondary | ICD-10-CM | POA: Diagnosis not present

## 2018-04-09 DIAGNOSIS — R269 Unspecified abnormalities of gait and mobility: Secondary | ICD-10-CM

## 2018-04-09 MED ORDER — ARMODAFINIL 200 MG PO TABS: ORAL_TABLET | ORAL | 5 refills | Status: DC

## 2018-04-09 NOTE — Progress Notes (Signed)
[facility]  PATIENT: Gregory Wiley DOB: 1950-11-21  REFERRING CLINICIAN: Tamsen Roers HISTORY FROM: patient   HISTORICAL  CHIEF COMPLAINT:  Chief Complaint  Patient presents with  . Follow-up    RM 13 with wife. Last seen 08/06/17. Hx stroke. Taking Eliquis for preventative  . Gait Problem    Using 4-pronged cane. No falls since last seen.  . Sleep Apnea    Using CPAP regularly. Needs replacement piece for CPAP nasal piece.     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   He feels he is stable for the most part.    Update 1.23/2020: He denies any new symptoms since the last visit.   He felt funny last week feeling unfocused and was concerned he might have a stroke but everything was back to baseline after resting.   His wife felt he looked pale.  He notes the left pinky was twitching for a few minutes but no other symptoms.    He is on Eliquis and notes easy bruising.   He has had a slow drop in the hemoglobin and his doctors may refer for coloscopy.    He can walk 60-100 feet with a cene and has no recent falls.    His mouth is dry.   He feels his bladder is ok.     He has rare fecal incontinence if he takes senokot for constipation.     He is very apathetic and notes some depression.   He is on sertraline..    He has a short temper.   He has OSA and is on CPAP.    He has not been on Nuvigil.   He was on Provigil shortly after the stroke but it had not helped much.      Update 08/06/2017: He denies new stroke like symptoms but notes swallowing is harder.    He needs to wash everything down or it will get stuck.    His mouth is often dry.     He is on Eliquis and notes he bruises easily, especially when a puppy jumps on him.     From the stroke, he has left sided weakness and spasticity   Tizanidine is helping the spasticity.  He is walking a little bit better and now can go about 80 feet using a  cane before stopping.       Mood is much better and we discussed stopping the zoloft  He uses CPAP nightly and has a new machine.     Download shows great AHI at 2.5 and 100% compliance and average usage of 7 hours.   He notes some mental fogginess at times but this is no worse.   He fatigues easily.      He notes some neck pain but is doing better with a new pillow.  He does not need a TPI.        Update 03/06/2017:    He denies any new neurologic symptoms.    He has spastic left hemiplegia and left foot drop.  He uses his cane around the house.  He can go 30 feet without too much of a problem A brace is heavy and he would rather walk without it.    He had one fall last month.   His foot is not flat when he stands up on the floor.   His neck is hurting more.  Pain stays in  his neck with no radiation into the arm.    He feels left spasticity is about the same.    At times, the spasticity can be painful in the leg.  Cognition is stable. His left sided neglect improved over time.   He is on Eliquis as anticoagulation.  He has OSA and has a new machine and mask.   He showed me an app and he had 3.3 - 8.1 AHI over the past week.         He notes neck pain, right greater than left. He does not want trigger point injection. In the past, TPI's have helped but he finds them to be painful.  From 09/16/2016: Stroke:   He denies any new stroke symptoms. He remains on Elavil risk for stroke prophylaxis. He has not had any transient ischemic attacks or other transient neurologic symptoms. He continues to have left hemiplegia and he gets slurred speech when he is tired.   Stroke History:  On 02/12/2014, he woke up with weakness on the left side and was unable to stand up. He went to the emergency room.Marland Kitchen MRI of the brain showed an acute right MCA territory stroke that mostly involved the right temporal lobe with extension to the insula and the basal ganglia on the right. MR angiogram showed an occlusion of the  right M1 segment  Of note, he had been on aspirin and Plavix that had been discontinued a few weeks earlier due to a bleeding ulcer.   Ultrasound of the carotid arteries showed less than 39% stenosis. A transesophageal echocardiogram showed a LVEF equals 45%, there was septal hypokinesia, no PFO or ASD was identified.  Cardioembolism was suspected.  He was initially placed on Plavix but that was subsequently changed to Xarelto after he was found to have pulmonary embolisms while at rehabilitation center.  Gait/Strength/sensation:  Due to left hemiplegia, his gait is poor. He has had a few falls. With a quad cane he can walk about 30-50 feet if he is not tired and about 20-30 feet if he is tired. His bathroom is 30 feet away.  In the past, he was having orthostatic hypotension and had some episodes of syncope.    His leg also has left sided spasticity.   He also has numbness on the left side with some pain. He had some neglect and numbness initially that improved.   He has been on baclofen in the past but felt no benefit and felt very sleepy.    Orthostatic hypotension:     He is on Florinef and midodrine.  He gets pre-syncope but no recent syncope.     Neck pain/Headache:  He continues to report a neck pain, better than last year but still daily.   In the past, occiptial nerve blocks helped to reduce the severe headache and neck pain.    Bladder:  He has urinary frequency and urgency this year.   He has not had incontinence.   Flomax was started but he flet dizzy and stopped.    OSA/sleep/sleepiness:    In the past, he had obstructive sleep apnea but stopped CPAP as he did not like the mask. However, he has lost > 150 pounds since the stroke.   He still snores but wife has not noted OSA.      He has excessive daytime sleepiness.  Methylphenidate 20 mg never really helped.     In the past, EDS was better when he wore CPAP but he can't wear  he whole night.      Insomnia:   He has sleep maintenance insomnia  > sleep onet (usually quick).    He takes melatonin at bedtime.   Cognitive:   He and his wife note some decreased attention and decrease executive function abilities.  He had a neglect after the stroke which appears to improve.  Mood:    He continues to feel depressed. He thinks this certainly may have helped slightly but not much. He gets irritable very easily. He denies anxiety.  REVIEW OF SYSTEMS:  Constitutional: No fevers, chills, sweats, or change in appetite.  He sleeps poorly.    He has daytime sleepiness.   Eyes: No visual changes, double vision, eye pain Ear, nose and throat: No hearing loss, ear pain, nasal congestion, sore throat Cardiovascular: No chest pain, palpitations Respiratory:  No shortness of breath at rest or with exertion.   No wheezes.  He has OSA and is on CPAP GastrointestinaI: No nausea, vomiting, diarrhea, abdominal pain, fecal incontinence Genitourinary:  No dysuria, urinary retention or frequency.  No nocturia. Musculoskeletal:  No neck pain, back pain but he has shoulder and hip pain Integumentary: No rash, pruritus, skin lesions Neurological: as above Psychiatric: No depression at this time.  No anxiety Endocrine: No palpitations, diaphoresis, change in appetite, change in weigh.  He notes feeling cold and increased thirst Hematologic/Lymphatic:  No anemia, purpura, petechiae. Allergic/Immunologic: No itchy/runny eyes, nasal congestion, recent allergic reactions, rashes  ALLERGIES: Allergies  Allergen Reactions  . Diphenhydramine Hcl Anaphylaxis  . Adhesive [Tape] Other (See Comments)    Tears skin - please use paper tape  . Proscar [Finasteride] Other (See Comments)    Lightheaded.   . Tamsulosin Other (See Comments)    Dizzy, vomiting.     HOME MEDICATIONS: Outpatient Medications Prior to Visit  Medication Sig Dispense Refill  . acetaminophen (TYLENOL) 325 MG tablet Take 1 tablet (325 mg total) by mouth every 6 (six) hours as needed for mild  pain.    Marland Kitchen albuterol (PROVENTIL HFA;VENTOLIN HFA) 108 (90 Base) MCG/ACT inhaler Inhale 2 puffs into the lungs every 6 (six) hours as needed for wheezing or shortness of breath. 1 Inhaler 0  . cholecalciferol (VITAMIN D) 1000 units tablet Take 1,000 Units by mouth daily.    Marland Kitchen doxepin (SINEQUAN) 10 MG capsule TAKE 1 CAPSULE (10 MG TOTAL) BY MOUTH AT BEDTIME. 30 capsule 11  . doxycycline (VIBRA-TABS) 100 MG tablet Take 1 tablet (100 mg total) by mouth 2 (two) times daily. 20 tablet 0  . ELIQUIS 5 MG TABS tablet TAKE 1 TABLET BY MOUTH 2 TIMES DAILY. 180 tablet 1  . FEROSUL 325 (65 Fe) MG tablet TAKE 1 TABLET BY MOUTH 2 TIMES DAILY WITH A MEAL. 60 tablet 2  . fluticasone (FLONASE) 50 MCG/ACT nasal spray Place 2 sprays into both nostrils daily.    Marland Kitchen gabapentin (NEURONTIN) 300 MG capsule Take 1 capsule (300 mg total) by mouth at bedtime. 90 capsule 1  . HYDROcodone-acetaminophen (NORCO/VICODIN) 5-325 MG tablet Take 1 tablet by mouth daily as needed for moderate pain. 30 tablet 0  . hydrocortisone (ANUSOL-HC) 25 MG suppository Place 1 suppository (25 mg total) rectally 2 (two) times daily as needed. X 1 week; then, use as needed thereafter. 24 suppository 3  . Melatonin 10 MG CAPS TAKE 1 CAPSULE BY MOUTH AT BEDTIME AS NEEDED FOR SLEEP 90 capsule 3  . midodrine (PROAMATINE) 2.5 MG tablet TAKE 1 TABLET BY MOUTH 2 TIMES DAILY WITH  MEALS. 60 tablet 3  . nitroGLYCERIN (NITROSTAT) 0.4 MG SL tablet Place 1 tablet (0.4 mg total) under the tongue every 5 (five) minutes as needed for chest pain.    . NONFORMULARY OR COMPOUNDED ITEM Supplement menmoringa oleifera taken daily.    Marland Kitchen nystatin (MYCOSTATIN/NYSTOP) powder Apply topically 2 (two) times daily. 60 g 3  . pantoprazole (PROTONIX) 40 MG tablet TAKE 1 TABLET BY MOUTH 2 TIMES DAILY. 60 tablet 6  . potassium chloride (K-DUR) 10 MEQ tablet TAKE 1 TABLETS BY MOUTH 2 TIMES DAILY. 360 tablet 3  . PRESCRIPTION MEDICATION Inhale into the lungs at bedtime. CPAP    .  prochlorperazine (COMPAZINE) 10 MG tablet Take 1 tablet (10 mg total) by mouth every 8 (eight) hours as needed (headache, nausea or vomiting). 10 tablet 0  . QC LO-DOSE ASPIRIN 81 MG EC tablet TAKE 1 TABLET (81 MG TOTAL) BY MOUTH DAILY. SWALLOW WHOLE. 90 tablet 3  . QC STOOL SOFTENER PLS LAXATIVE 8.6-50 MG tablet TAKE 1 TABLET BY MOUTH 2 TIMES DAILY. 60 tablet 2  . rosuvastatin (CRESTOR) 40 MG tablet TAKE 1 TABLET BY MOUTH DAILY. 90 tablet 3  . senna (SENOKOT) 8.6 MG tablet Take 1 tablet by mouth 2 (two) times daily.    . sertraline (ZOLOFT) 50 MG tablet Take 1 tablet (50 mg total) by mouth daily. 90 tablet 3  . tiZANidine (ZANAFLEX) 4 MG capsule Take 1 capsule (4 mg total) by mouth as needed. 90 capsule 3   No facility-administered medications prior to visit.     PAST MEDICAL HISTORY: Past Medical History:  Diagnosis Date  . Adrenal insufficiency ([place])   . Anxiety   . Basal cell carcinoma of skin of left ankle   . Bleeding stomach ulcer 2015  . Coronary artery disease    stent, then CABG 07/20/10  . Daily headache    "for the past month" (11/11/2014)  . Depression   . Diverticulosis   . GERD (gastroesophageal reflux disease)   . Gout   . H/O cardiomyopathy    ischemic, now last echo 07/30/11, EF 38%W  . History of hiatal hernia   . Hyperlipidemia   . Hypertension   . Internal hemorrhoids   . Left hemiplegia ([place])   . NSTEMI (non-ST elevated myocardial infarction) ([place])    NSTEMI- last cath 06/2011-Stent to LCX-DES, last nuc 07/30/11 low risk  . Obesity (BMI 30-39.9)   . OSA on CPAP    since July 2013- uses CPAP sometimes , states he has lost 147 lbs. since stroke & doesn't use the CPAP as much as he use to.   . Plantar fasciitis of left foot   . Pneumonia    hosp. 12/2014  . Pulmonary embolism ([place]) 03/2014  . Stroke ([place]) 01/2014   "no control of LUE, can slightly LLE; memory problems since" (11/11/2014)  . Tubular adenoma of colon     PAST SURGICAL HISTORY: Past Surgical  History:  Procedure Laterality Date  . BASAL CELL CARCINOMA EXCISION Left 2015   ankle  . CARDIAC SURGERY    . CATARACT EXTRACTION Bilateral   . COLONOSCOPY N/A 02/10/2013   Procedure: COLONOSCOPY;  Surgeon: Ladene Artist, MD;  Location: [facility];  Service: Endoscopy;  Laterality: N/A;  . CORONARY ANGIOPLASTY WITH STENT PLACEMENT  07/01/2011   DES-Resolute to native LCX  . CORONARY ANGIOPLASTY WITH STENT PLACEMENT  12/01/2007   COMPLEX 5 LESION PCI INCLUDING CUTTING BALLOON AND 4 CYPHER DESs  . CORONARY ARTERY BYPASS  GRAFT  07/20/2010    LIMA-LAD; VG-ACUTE MARG of RCA; Seq VG-distal RCA & then pda  . EP IMPLANTABLE DEVICE N/A 02/14/2016   Procedure: Loop Recorder Removal;  Surgeon: Thompson Grayer, MD;  Location: [place] CV LAB;  Service: Cardiovascular;  Laterality: N/A;  . ESOPHAGOGASTRODUODENOSCOPY  01/2014   gastric ulcer, erosive gastroduodenitis, H Pylori negative.   . ESOPHAGOGASTRODUODENOSCOPY (EGD) WITH PROPOFOL N/A 11/14/2014   Procedure: ESOPHAGOGASTRODUODENOSCOPY (EGD) WITH PROPOFOL;  Surgeon: Milus Banister, MD;  Location: [facility];  Service: Endoscopy;  Laterality: N/A;  . HAND SURGERY Right    to take glass out  . HERNIA REPAIR    . INTRAMEDULLARY (IM) NAIL INTERTROCHANTERIC Right 02/07/2015   Procedure: INTRAMEDULLARY (IM) NAIL INTERTROCHANTRIC RIGHT HIP;  Surgeon: Renette Butters, MD;  Location: [facility];  Service: Orthopedics;  Laterality: Right;  . KNEE ARTHROSCOPY Right   . LEFT HEART CATHETERIZATION WITH CORONARY/GRAFT ANGIOGRAM  07/01/2011   Procedure: LEFT HEART CATHETERIZATION WITH Beatrix Fetters;  Surgeon: Sanda Klein, MD;  Location: [facility] CATH LAB;  Service: Cardiovascular;;  . LOOP RECORDER IMPLANT  02/15/2014   MDT LINQ implanted by Dr Rayann Heman for cryptogenic stroke  . PERCUTANEOUS CORONARY STENT INTERVENTION (PCI-S) Right 07/01/2011   Procedure: PERCUTANEOUS CORONARY STENT INTERVENTION (PCI-S);  Surgeon: Sanda Klein, MD;  Location: [facility] CATH  LAB;  Service: Cardiovascular;  Laterality: Right;  . PICC LINE PLACE PERIPHERAL ([place] HX)  06/2015  . RETINAL DETACHMENT SURGERY Right   . TEE WITHOUT CARDIOVERSION N/A 02/15/2014   Procedure: TRANSESOPHAGEAL ECHOCARDIOGRAM (TEE);  Surgeon: Josue Hector, MD;  Location: [place];  Service: Cardiovascular;  Laterality: N/A;  . UMBILICAL HERNIA REPAIR  2006    FAMILY HISTORY: Family History  Problem Relation Age of Onset  . Diabetes type II Mother   . CAD Father 32       CABG, PCI, died at 66  . Heart attack Father 86  . Hypertension Brother   . Diabetes Brother   . Hyperlipidemia Brother   . Brain cancer Maternal Grandmother   . COPD Paternal Grandfather   . Coronary artery disease Other   . Other Other        denies family history of DVT/PE  . Colon cancer Neg Hx   . Stroke Neg Hx   . Prostate cancer Neg Hx     SOCIAL HISTORY:  Social History   Socioeconomic History  . Marital status: Married    Spouse name: Not on file  . Number of children: 5  . Years of education: Not on file  . Highest education level: Not on file  Occupational History    Employer: [employer]    Comment: works at the [employer]  . Financial resource strain: Not on file  . Food insecurity:    Worry: Not on file    Inability: Not on file  . Transportation needs:    Medical: Not on file    Non-medical: Not on file  Tobacco Use  . Smoking status: Former Smoker    Packs/day: 2.00    Years: 4.00    Pack years: 8.00    Types: Cigarettes    Last attempt to quit: 01/30/1969    Years since quitting: 49.2  . Smokeless tobacco: Never Used  . Tobacco comment: "quit smoking cigarettes  in the 1970's"  Substance and Sexual Activity  . Alcohol use: No    Alcohol/week: 0.0 standard drinks  . Drug use: No    Comment: "smoked  some pot in college"  . Sexual activity: Not Currently  Lifestyle  . Physical activity:    Days per week: Not on file    Minutes per session: Not on file   . Stress: Not on file  Relationships  . Social connections:    Talks on phone: Not on file    Gets together: Not on file    Attends religious service: Not on file    Active member of club or organization: Not on file    Attends meetings of clubs or organizations: Not on file    Relationship status: Not on file  . Intimate partner violence:    Fear of current or ex partner: Not on file    Emotionally abused: Not on file    Physically abused: Not on file    Forced sexual activity: Not on file  Other Topics Concern  . Not on file  Social History Narrative   Quit smoking 40 years ago   Married 1996   Likes to play guitar and sing with church group unable since 02/12/14 stroke    [employer] 1970-1972, [place], no service related issues.     Left handed but now right handed after stroke     PHYSICAL EXAM  Vitals:   04/09/18 1521  BP: 116/75  Pulse: 65  Weight: 258 lb 8 oz (117.3 kg)  Height: 6' (1.829 m)    Body mass index is 35.06 kg/m.   General: The patient is well-developed and well-nourished and in no acute distress.  He is in a wheelchair.  Neck:  There are no carotid bruits. The neck is mildly tender in the lower cervical paraspinal region and the trapezius muscles..     Skin: He has mild edema at the left ankle.  Neurologic Exam  Mental status: The patient is alert and oriented x 3 at the time of the examination. The patient has apparent normal recent and remote memory. Attention is mildly reduced.   Speech is normal.   There is no neglect.  Cranial nerves: Extraocular movements are full.  Facial strength and sensation is normal.  Trapezius strength is normal.. No dysarthria is noted.  No obvious hearing deficits are noted.  Motor:  Muscle bulk is normal.  He has increased muscle tone on the left.   strength is 5/5 on the right. Strength is 2/5 in left shoulder elevation and 1/5 in proximal arm strength.  Strength is 2/5 in the proximal left leg and 3/5 in the  quadriceps and 2/5 in the distal leg  Sensory: He has intact sensation to touch and vibration.  Coordination:  Finger-nose-finger and heel-to-shin is performed well on the right but he cannot do either of these on the left.    Gait and station: He needs support to stand or take a few steps  Reflexes: Deep tendon reflexes are increased on the left.Marland Kitchen      DIAGNOSTIC DATA (LABS, IMAGING, TESTING) - I reviewed patient records, labs, notes, testing and imaging myself where available.  Lab Results  Component Value Date   WBC 5.5 03/13/2018   HGB 14.7 03/13/2018   HCT 43.7 03/13/2018   MCV 89.6 03/13/2018   PLT 150.0 03/13/2018      Component Value Date/Time   NA 140 03/13/2018 1353   K 4.2 03/13/2018 1353   CL 106 03/13/2018 1353   CO2 27 03/13/2018 1353   GLUCOSE 90 03/13/2018 1353   BUN 25 (H) 03/13/2018 1353   CREATININE 0.98 03/13/2018 1353  CREATININE 0.99 08/11/2015 1528   CALCIUM 9.0 03/13/2018 1353   PROT 7.0 03/13/2018 1353   ALBUMIN 3.9 03/13/2018 1353   AST 20 03/13/2018 1353   ALT 21 03/13/2018 1353   ALKPHOS 57 03/13/2018 1353   BILITOT 0.5 03/13/2018 1353   GFRNONAA >60 05/06/2017 1910   GFRNONAA >89 09/13/2014 1032   GFRAA >60 05/06/2017 1910   GFRAA >89 09/13/2014 1032   Lab Results  Component Value Date   CHOL 113 08/30/2016   HDL 48.10 08/30/2016   LDLCALC 69 10/10/2017   TRIG 40.0 08/30/2016   CHOLHDL 2 08/30/2016   Lab Results  Component Value Date   HGBA1C 5.6 02/13/2014   Lab Results  Component Value Date   VITAMINB12 286 07/05/2015   Lab Results  Component Value Date   TSH 1.035 12/23/2014        ASSESSMENT AND PLAN  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    He will continue Eliquis for anticoagulation.   Advised to follow-up  with GI if he continues to have blood loss. 2.    Use 4 prong cane around the house.   3.    Continue Zoloft.    4.    Continue regular use of CPAP.   Add Nuvigil 200 mg daily may help the apathy and sleepiness. 5.    He will return to see me in 5-6 months or sooner if he has new or worsening neurologic symptoms.   Richard A. Felecia Shelling, MD, PhD 6/81/5947, 0:76 PM Certified in Neurology, Clinical Neurophysiology, Sleep Medicine, Pain Medicine and Neuroimaging  [facility] [address], [place], [place] [address] [phone]

## 2018-04-13 ENCOUNTER — Telehealth: Payer: Self-pay | Admitting: *Deleted

## 2018-04-13 NOTE — Telephone Encounter (Signed)
Initiated PA armodafinil 200mg  tab on covermymeds. [id number]. In process of completing.  Received the response: "[employer] is processing your inquiry and will respond shortly with next steps. You are currently using the fastest method to process this request. Please do not fax or call [employer] to resubmit this request."

## 2018-04-14 NOTE — Telephone Encounter (Signed)
Armodafinil PA approved by [employer], for dates 03/15/18-04/14/19. Case ID: [id number]

## 2018-04-14 NOTE — Telephone Encounter (Signed)
PA for Armodafinil 200mg  completed via Cover [employer]. Key# [id number]. Dx: OSA (G47.33). He does use CPAP/fim

## 2018-05-25 ENCOUNTER — Encounter: Payer: Self-pay | Admitting: Cardiovascular Disease

## 2018-05-25 ENCOUNTER — Ambulatory Visit (INDEPENDENT_AMBULATORY_CARE_PROVIDER_SITE_OTHER): Payer: Medicare Other | Admitting: Cardiovascular Disease

## 2018-05-25 VITALS — BP 125/73 | HR 64 | Ht 72.0 in | Wt 264.0 lb

## 2018-05-25 DIAGNOSIS — I25810 Atherosclerosis of coronary artery bypass graft(s) without angina pectoris: Secondary | ICD-10-CM

## 2018-05-25 DIAGNOSIS — Z951 Presence of aortocoronary bypass graft: Secondary | ICD-10-CM | POA: Diagnosis not present

## 2018-05-25 DIAGNOSIS — G4733 Obstructive sleep apnea (adult) (pediatric): Secondary | ICD-10-CM

## 2018-05-25 DIAGNOSIS — E785 Hyperlipidemia, unspecified: Secondary | ICD-10-CM

## 2018-05-25 DIAGNOSIS — I255 Ischemic cardiomyopathy: Secondary | ICD-10-CM

## 2018-05-25 DIAGNOSIS — I480 Paroxysmal atrial fibrillation: Secondary | ICD-10-CM

## 2018-05-25 NOTE — Patient Instructions (Signed)
Medication Instructions:  The current medical regimen is effective;  continue present plan and medications.  If you need a refill on your cardiac medications before your next appointment, please call your pharmacy.   Lab work: Fasting lab work (CBC, CMET, TSH, LIPID)  If you have labs (blood work) drawn today and your tests are completely normal, you will receive your results only by: Marland Kitchen MyChart Message (if you have MyChart) OR . A paper copy in the mail If you have any lab test that is abnormal or we need to change your treatment, we will call you to review the results.  Testing/Procedures: Your physician has requested that you have an [place]. A cardiac stress test is a cardiological test that measures the heart's ability to respond to external stress in a controlled clinical environment. For further information please visit [email or web address]. If you have questions or concerns about your appointment, you can call the Nuclear Lab at [phone].     Follow-Up: At [facility], you and your health needs are our priority.  As part of our continuing mission to provide you with exceptional heart care, we have created designated Provider Care Teams.  These Care Teams include your primary Cardiologist (physician) and Advanced Practice Providers (APPs -  Physician Assistants and Nurse Practitioners) who all work together to provide you with the care you need, when you need it. You will need a follow up appointment in 3 months.  Please call our office 2 months in advance to schedule this appointment.  You may see Dr.Kelly or one of the following Advanced Practice Providers on your designated Care Team: Almyra Deforest, [place] . Fabian Sharp, PA-C

## 2018-05-25 NOTE — Progress Notes (Signed)
Patient ID: Gregory Wiley, male   DOB: [birth year], 68 y.o.   MRN: [id number]    Primary MD: Dr. [name]  HPI: Gregory Wiley is a 68 y.o. male presents to the office today for a 13 month follow-up cardiology/sleep evaluation.   Gregory Wiley has established CAD and underwent stenting of his RCA in May 2011. He is s/p CABG surgery with LIMA to the LAD, sequential vein to the distal RCA and PDA, and vein to the acute marginal branch of his right coronary artery. In April 2013 he developed unstable angina and ruled in for non-ST segment elevation myocardial infarction. Catheterization revealed 99% stenosis of the large left circumflex vessel and he underwent successful stenting with insertion of a 4.0x18 mm resolute DES stent post dilated to 4.5 mm.  Additional problems include obstructive sleep apnea. On his initial polysomnogram in 2010 AHI was 11.3 overall, but during REM sleep he had moderate sleep apnea with an AHI of 24.8. He never did initiate CPAP therapy at that time. However, since his myocardial infarction he has been using CPAP therapy since July 2013.  When I last saw him, he was only utilizing CPAP intermittently.  He now admits to much more frequent use but there are times when he does not use treatment.  He has a history of hyperlipidemia, hypertension, and morbid obesity. An echo Doppler study in May 2013 suggested an ejection fraction of 38% and on nuclear imaging  was 37% with scar in the LAD territory.  He was admitted to [facility] hospital in November 2015 with a large right MCA territory CVA.  TEE was negative for embolic source.  EF was 45% on echocardiogram.  He underwent implantable loop recorder by Dr. Rayann Wiley. He was readmitted with bilateral submassive pulmonary embolism.  Venous Dopplers were positive for DVT.  Ejection fraction was 25-30%, which is felt to be contributed by strain from his pulmonary emboli.  He had mild troponin elevation which was felt to be due to demand ischemia  and has been on Xarelto for anticoagulation.  He was admitted on the [facility] hospitalist service on 11/11/2014 with dehydration.  Several of his medications were held.  I reviewed his hospital record.  He had a history of nausea and vomiting that had been going on for approximatey a month.  He has continued to feel weak and in his words, "feels terrible" since his hospitalization.  He has had issues with headaches which he did see neurology.  He has not been eating well.  He seeing Dr. Damita Dunnings at [facility] for primary care.  He denies chest pressure.  He denies fevers.  He is on Xarelto anticoagulation and denies overt bleeding.  There is a remote history of erosive gastroduodenitis, GERD, esophageal stricture for which in the past he had been seen by Dr. Fuller Plan.  When I previously saw him, he was tachycardic which I felt was contributed by his Ritalin.  He has since been off this medication.   In November 2017, he had removal of his loop recorder by Dr. Rayann Wiley.  This had been initially placed at that in 2015 following his large right middle cerebral artery stroke.  Over the last several years he admits to a purposeful 170 pound weight loss with a peak weight at 346 down to 193.  His initial sleep study was in September 2010.  When I last saw him in September 2018.  He complained of significant fatigability.  An Epworth Sleepiness Scale score was calculated  and this endorsed at 15 which is consistent with excessive daytime sleepiness.  He also has noticed lower extremity edema.    He underwent a sleep study on 12/14/2016.  He was found to have severe sleep apnea with an AHI of 36.1 and RDI 40.4.  CPAP was started at 5 cm and was titrated up to 12 cm.  Oxygen desaturation was 87%.  He was unable to achieve any REM sleep on the diagnostic portion of the study.  He was set up with CPAP on 02/28/2017.  DME's [facility].  Hollace Kinnier was obtained from January 24 through separate 22 2019.  He is compliant with  97% of usage stays and 87% of usage greater than 4 hours.  He is averaging 7 hours and 33 minutes of sleep per night.  At 11 cm water pressure.  AHI is 1.2.  He does have moderate leak almost on a daily basis.  He has been using nasal pillows, but upon further questioning, there is significant oral venting and mouth breathing.  Epworth Sleepiness Scale score endorsed at 17 today, which is compatible with residual daytime sleepiness.   I last saw him in February 2019.  At that time, I reviewed his most recent sleep study in detail.  Over the past year, he has been followed by Dr. [name].  He had issues with iron deficiency anemia.  He denies any episodes of chest pain.  He continues to use CPAP with excellent compliance.  He admits to a 40 pound weight gain over the past year.  Not been very active and has been eating more.  Download was obtained in the office today of his CPAP unit from April 25, 2018 through May 24, 2018.  Usage days was 97% with usage greater than 4 hours 90%.  Average usage was 7 hours and 53 minutes.  At a 11 cm set pressure, AHI is excellent at 1.6.  There are several days of increased leak of his mask.  His home medical is his DME company.  He presents for evaluation.  Past Medical History:  Diagnosis Date  . Adrenal insufficiency ([place])   . Anxiety   . Basal cell carcinoma of skin of left ankle   . Bleeding stomach ulcer 2015  . Coronary artery disease    stent, then CABG 07/20/10  . Daily headache    "for the past month" (11/11/2014)  . Depression   . Diverticulosis   . GERD (gastroesophageal reflux disease)   . Gout   . H/O cardiomyopathy    ischemic, now last echo 07/30/11, EF 38%W  . History of hiatal hernia   . Hyperlipidemia   . Hypertension   . Internal hemorrhoids   . Left hemiplegia ([place])   . NSTEMI (non-ST elevated myocardial infarction) ([place])    NSTEMI- last cath 06/2011-Stent to LCX-DES, last nuc 07/30/11 low risk  . Obesity (BMI 30-39.9)   . OSA on  CPAP    since July 2013- uses CPAP sometimes , states he has lost 147 lbs. since stroke & doesn't use the CPAP as much as he use to.   . Plantar fasciitis of left foot   . Pneumonia    hosp. 12/2014  . Pulmonary embolism ([place]) 03/2014  . Stroke ([place]) 01/2014   "no control of LUE, can slightly LLE; memory problems since" (11/11/2014)  . Tubular adenoma of colon     Past Surgical History:  Procedure Laterality Date  . BASAL CELL CARCINOMA EXCISION Left 2015  ankle  . CARDIAC SURGERY    . CATARACT EXTRACTION Bilateral   . COLONOSCOPY N/A 02/10/2013   Procedure: COLONOSCOPY;  Surgeon: Ladene Artist, MD;  Location: [facility];  Service: Endoscopy;  Laterality: N/A;  . CORONARY ANGIOPLASTY WITH STENT PLACEMENT  07/01/2011   DES-Resolute to native LCX  . CORONARY ANGIOPLASTY WITH STENT PLACEMENT  12/01/2007   COMPLEX 5 LESION PCI INCLUDING CUTTING BALLOON AND 4 CYPHER DESs  . CORONARY ARTERY BYPASS GRAFT  07/20/2010    LIMA-LAD; VG-ACUTE MARG of RCA; Seq VG-distal RCA & then pda  . EP IMPLANTABLE DEVICE N/A 02/14/2016   Procedure: Loop Recorder Removal;  Surgeon: Thompson Grayer, MD;  Location: [facility];  Service: Cardiovascular;  Laterality: N/A;  . ESOPHAGOGASTRODUODENOSCOPY  01/2014   gastric ulcer, erosive gastroduodenitis, H Pylori negative.   . ESOPHAGOGASTRODUODENOSCOPY (EGD) WITH PROPOFOL N/A 11/14/2014   Procedure: ESOPHAGOGASTRODUODENOSCOPY (EGD) WITH PROPOFOL;  Surgeon: Milus Banister, MD;  Location: [place];  Service: Endoscopy;  Laterality: N/A;  . HAND SURGERY Right    to take glass out  . HERNIA REPAIR    . INTRAMEDULLARY (IM) NAIL INTERTROCHANTERIC Right 02/07/2015   Procedure: INTRAMEDULLARY (IM) NAIL INTERTROCHANTRIC RIGHT HIP;  Surgeon: Renette Butters, MD;  Location: [place];  Service: Orthopedics;  Laterality: Right;  . KNEE ARTHROSCOPY Right   . LEFT HEART CATHETERIZATION WITH CORONARY/GRAFT ANGIOGRAM  07/01/2011   Procedure: LEFT HEART CATHETERIZATION  WITH Beatrix Fetters;  Surgeon: Sanda Klein, MD;  Location: [facility] CATH LAB;  Service: Cardiovascular;;  . LOOP RECORDER IMPLANT  02/15/2014   MDT LINQ implanted by Dr Rayann Wiley for cryptogenic stroke  . PERCUTANEOUS CORONARY STENT INTERVENTION (PCI-S) Right 07/01/2011   Procedure: PERCUTANEOUS CORONARY STENT INTERVENTION (PCI-S);  Surgeon: Sanda Klein, MD;  Location: [facility] CATH LAB;  Service: Cardiovascular;  Laterality: Right;  . PICC LINE PLACE PERIPHERAL ([place] HX)  06/2015  . RETINAL DETACHMENT SURGERY Right   . TEE WITHOUT CARDIOVERSION N/A 02/15/2014   Procedure: TRANSESOPHAGEAL ECHOCARDIOGRAM (TEE);  Surgeon: Josue Hector, MD;  Location: [place];  Service: Cardiovascular;  Laterality: N/A;  . UMBILICAL HERNIA REPAIR  2006    Allergies  Allergen Reactions  . Diphenhydramine Hcl Anaphylaxis  . Adhesive [Tape] Other (See Comments)    Tears skin - please use paper tape  . Proscar [Finasteride] Other (See Comments)    Lightheaded.   . Tamsulosin Other (See Comments)    Dizzy, vomiting.     Current Outpatient Medications  Medication Sig Dispense Refill  . acetaminophen (TYLENOL) 325 MG tablet Take 1 tablet (325 mg total) by mouth every 6 (six) hours as needed for mild pain.    Marland Kitchen albuterol (PROVENTIL HFA;VENTOLIN HFA) 108 (90 Base) MCG/ACT inhaler Inhale 2 puffs into the lungs every 6 (six) hours as needed for wheezing or shortness of breath. 1 Inhaler 0  . Armodafinil (NUVIGIL) 200 MG TABS Take 1 po qAM 30 tablet 5  . cholecalciferol (VITAMIN D) 1000 units tablet Take 1,000 Units by mouth daily.    Marland Kitchen doxepin (SINEQUAN) 10 MG capsule TAKE 1 CAPSULE (10 MG TOTAL) BY MOUTH AT BEDTIME. 30 capsule 11  . ELIQUIS 5 MG TABS tablet TAKE 1 TABLET BY MOUTH 2 TIMES DAILY. 180 tablet 1  . FEROSUL 325 (65 Fe) MG tablet TAKE 1 TABLET BY MOUTH 2 TIMES DAILY WITH A MEAL. 60 tablet 2  . fluticasone (FLONASE) 50 MCG/ACT nasal spray Place 2 sprays into both nostrils daily.    Marland Kitchen gabapentin  (  NEURONTIN) 300 MG capsule Take 1 capsule (300 mg total) by mouth at bedtime. 90 capsule 1  . HYDROcodone-acetaminophen (NORCO/VICODIN) 5-325 MG tablet Take 1 tablet by mouth daily as needed for moderate pain. 30 tablet 0  . hydrocortisone (ANUSOL-HC) 25 MG suppository Place 1 suppository (25 mg total) rectally 2 (two) times daily as needed. X 1 week; then, use as needed thereafter. 24 suppository 3  . Melatonin 10 MG CAPS TAKE 1 CAPSULE BY MOUTH AT BEDTIME AS NEEDED FOR SLEEP 90 capsule 3  . midodrine (PROAMATINE) 2.5 MG tablet Take 2.5 mg by mouth daily.    . nitroGLYCERIN (NITROSTAT) 0.4 MG SL tablet Place 1 tablet (0.4 mg total) under the tongue every 5 (five) minutes as needed for chest pain.    . NONFORMULARY OR COMPOUNDED ITEM Supplement menmoringa oleifera taken daily.    Marland Kitchen nystatin (MYCOSTATIN/NYSTOP) powder Apply topically 2 (two) times daily. 60 g 3  . pantoprazole (PROTONIX) 40 MG tablet TAKE 1 TABLET BY MOUTH 2 TIMES DAILY. 60 tablet 6  . potassium chloride (K-DUR) 10 MEQ tablet TAKE 1 TABLETS BY MOUTH 2 TIMES DAILY. 360 tablet 3  . PRESCRIPTION MEDICATION Inhale into the lungs at bedtime. CPAP    . prochlorperazine (COMPAZINE) 10 MG tablet Take 1 tablet (10 mg total) by mouth every 8 (eight) hours as needed (headache, nausea or vomiting). 10 tablet 0  . QC LO-DOSE ASPIRIN 81 MG EC tablet TAKE 1 TABLET (81 MG TOTAL) BY MOUTH DAILY. SWALLOW WHOLE. 90 tablet 3  . QC STOOL SOFTENER PLS LAXATIVE 8.6-50 MG tablet TAKE 1 TABLET BY MOUTH 2 TIMES DAILY. 60 tablet 2  . rosuvastatin (CRESTOR) 40 MG tablet TAKE 1 TABLET BY MOUTH DAILY. 90 tablet 3  . senna (SENOKOT) 8.6 MG tablet Take 1 tablet by mouth 2 (two) times daily.    . sertraline (ZOLOFT) 50 MG tablet Take 1 tablet (50 mg total) by mouth daily. 90 tablet 3  . tiZANidine (ZANAFLEX) 4 MG capsule Take 1 capsule (4 mg total) by mouth as needed. 90 capsule 3   No current facility-administered medications for this visit.     Social History    Socioeconomic History  . Marital status: Married    Spouse name: Not on file  . Number of children: 5  . Years of education: Not on file  . Highest education level: Not on file  Occupational History    Employer: [employer]    Comment: works at the [employer]  . Financial resource strain: Not on file  . Food insecurity:    Worry: Not on file    Inability: Not on file  . Transportation needs:    Medical: Not on file    Non-medical: Not on file  Tobacco Use  . Smoking status: Former Smoker    Packs/day: 2.00    Years: 4.00    Pack years: 8.00    Types: Cigarettes    Last attempt to quit: 01/30/1969    Years since quitting: 49.3  . Smokeless tobacco: Never Used  . Tobacco comment: "quit smoking cigarettes  in the 1970's"  Substance and Sexual Activity  . Alcohol use: No    Alcohol/week: 0.0 standard drinks  . Drug use: No    Comment: "smoked some pot in college"  . Sexual activity: Not Currently  Lifestyle  . Physical activity:    Days per week: Not on file    Minutes per session: Not on file  . Stress: Not  on file  Relationships  . Social connections:    Talks on phone: Not on file    Gets together: Not on file    Attends religious service: Not on file    Active member of club or organization: Not on file    Attends meetings of clubs or organizations: Not on file    Relationship status: Not on file  . Intimate partner violence:    Fear of current or ex partner: Not on file    Emotionally abused: Not on file    Physically abused: Not on file    Forced sexual activity: Not on file  Other Topics Concern  . Not on file  Social History Narrative   Quit smoking 40 years ago   Married 1996   Likes to play guitar and sing with church group unable since 02/12/14 stroke    [employer] 1970-1972, [place], no service related issues.     Left handed but now right handed after stroke    Family History  Problem Relation Age of Onset  . Diabetes type II  Mother   . CAD Father 67       CABG, PCI, died at 24  . Heart attack Father 4  . Hypertension Brother   . Diabetes Brother   . Hyperlipidemia Brother   . Brain cancer Maternal Grandmother   . COPD Paternal Grandfather   . Coronary artery disease Other   . Other Other        denies family history of DVT/PE  . Colon cancer Neg Hx   . Stroke Neg Hx   . Prostate cancer Neg Hx    Additional social history is notable in that he is married has 5 children 4 grandchildren. He does try to walk. There is no tobacco or alcohol use.  ROS General: Negative; No fevers, chills, or night sweats; positive for weight gain HEENT: Negative; No changes in vision or hearing, sinus congestion, difficulty swallowing Pulmonary: Negative; No cough, wheezing, shortness of breath, hemoptysis Cardiovascular: see HPI GI: History of recent nausea and vomiting. GU: Negative; No dysuria, hematuria, or difficulty voiding Musculoskeletal: Negative; no myalgias, joint pain, or weakness Hematologic/Oncology: Negative; no easy bruising, bleeding Endocrine: Negative; no heat/cold intolerance; no diabetes Neuro: Right MCA stroke with residual left-sided weakness Skin: Negative; No rashes or skin lesions Psychiatric: Negative; No behavioral problems, depression Sleep: Positive for sleep apnea on CPAP; CPAP set up 02/28/2017 ;  daytime sleepiness, hypersomnolence;  no bruxism, restless legs, hypnogognic hallucinations, no cataplexy Other comprehensive 14 point system review is negative.  PE BP 125/73   Pulse 64   Ht 6' (1.829 m)   Wt 264 lb (119.7 kg)   BMI 35.80 kg/m    Repeat blood pressure by me 110/70  Wt Readings from Last 3 Encounters:  05/25/18 264 lb (119.7 kg)  04/09/18 258 lb 8 oz (117.3 kg)  01/29/18 244 lb 12 oz (111 kg)   General: Alert, oriented, no distress. Bearded Skin: normal turgor, no rashes, warm and dry HEENT: Normocephalic, atraumatic. Pupils equal round and reactive to light; sclera  anicteric; extraocular muscles intact; Nose without nasal septal hypertrophy Mouth/Parynx benign; Mallinpatti scale 3 Neck: No JVD, no carotid bruits; normal carotid upstroke Lungs: clear to ausculatation and percussion; no wheezing or rales Chest wall: without tenderness to palpitation Heart: PMI not displaced, RRR, s1 s2 normal, 1/6 systolic murmur, no diastolic murmur, no rubs, gallops, thrills, or heaves Abdomen: Moderate central adiposity; soft, nontender; no hepatosplenomehaly, BS+; abdominal aorta  nontender and not dilated by palpation. Back: no CVA tenderness Pulses 2+ Musculoskeletal: full range of motion, normal strength, no joint deformities Extremities: no clubbing cyanosis or edema, Homan's sign negative  Neurologic: grossly nonfocal; Cranial nerves grossly wnl Psychologic: Normal mood and affect     ECG (independently read by me): Normal sinus rhythm at 64 bpm.  Left axis deviation.  LVH with repolarization changes.  QS complex V1 through V4 QTc interval 468 ms.  12/04/2016 ECG (independently read by me): Normal sinus rhythm at 74 bpm.  QRS complex V1 through V3.  Previously noted lateral ST changes.  Normal intervals.  March 2017 ECG (independently read by me):  Normal sinus rhythm at 61 bpm. Poor R wave progression. Lateral ST segment changes.  QTc interval 469 ms  September 2016 ECG (independently read by me): Sinus tachycardia 1 18 bpm.  Left axis deviation.  Cannot rule out old anterolateral infarct.  ECG (independently read by me): Normal sinus rhythm at 93 bpm.  QS complex anteriorly compatible with old anterior MI  April 2016 ECG (independently read by me): Normal sinus rhythm at 79 bpm.  Old anteroseptal MI with QS complex V1 through V3.  LVH by voltage criteria in aVL.  Increased QTc interval 488 ms.  ECG: Normal sinus rhythm at 60. QS complex in V1 through V3 concordant with his documented LAD region scar. T-wave inversion in leads 1 and avL  unchanged.   LABS:  BMP Latest Ref Rng & Units 03/13/2018 01/29/2018 10/28/2017  Glucose 70 - 99 mg/dL 90 148(H) 91  BUN 6 - 23 mg/dL 25(H) 19 21  Creatinine 0.40 - 1.50 mg/dL 0.98 1.17 1.18  Sodium 135 - 145 mEq/L 140 140 139  Potassium 3.5 - 5.1 mEq/L 4.2 4.1 4.6  Chloride 96 - 112 mEq/L 106 104 104  CO2 19 - 32 mEq/L _0 Calcium 8.4 - 10.5 mg/dL 9.0 9.1 9.7    Hepatic Function Latest Ref Rng & Units 03/13/2018 01/29/2018 10/28/2017  Total Protein 6.0 - 8.3 g/dL 7.0 7.4 7.5  Albumin 3.5 - 5.2 g/dL 3.9 3.8 4.0  AST 0 - 37 U/L _1 ALT 0 - 53 U/L _2 Alk Phosphatase 39 - 117 U/L 57 57 49  Total Bilirubin 0.2 - 1.2 mg/dL 0.5 0.5 0.6  Bilirubin, Direct 0.0 - 0.3 mg/dL - - -    CBC Latest Ref Rng & Units 03/13/2018 01/29/2018 10/28/2017  WBC 4.0 - 10.5 K/uL 5.5 5.8 5.6  Hemoglobin 13.0 - 17.0 g/dL 14.7 14.5 14.4  Hematocrit 39.0 - 52.0 % 43.7 43.8 43.3  Platelets 150.0 - 400.0 K/uL 150.0 181.0 166.0   Lab Results  Component Value Date   MCV 89.6 03/13/2018   MCV 88.5 01/29/2018   MCV 86.2 10/28/2017   Lab Results  Component Value Date   TSH 1.035 12/23/2014   Lab Results  Component Value Date   HGBA1C 5.6 02/13/2014    BNP    Component Value Date/Time   PROBNP 73.0 10/28/2017 1612    Lipid Panel     Component Value Date/Time   CHOL 113 08/30/2016 1508   CHOL 199 01/04/2013 1206   TRIG 40.0 08/30/2016 1508   TRIG 113 01/04/2013 1206   HDL 48.10 08/30/2016 1508   HDL 47 01/04/2013 1206   CHOLHDL 2 08/30/2016 1508   VLDL 8.0 08/30/2016 1508   LDLCALC 69 10/10/2017     RADIOLOGY: No results found.  IMPRESSION:  1.  OSA (obstructive sleep apnea)   2. Coronary artery disease involving coronary bypass graft of native heart without angina pectoris   3. Hx of CABG   4. Ischemic cardiomyopathy   5. Hyperlipidemia with target LDL less than 70   6. PAF (paroxysmal atrial fibrillation) [facility])     ASSESSMENT AND PLAN: Gregory Wiley is a  68 year old gentleman who has a history of morbid obesity, hypertension,  coronary artery disease who is status post stenting of his RCA in May 2011 with subsequent CABG surgery on Jul 20, 2010. In April 2013 he was found to have subtotal stenosis in a very large circumflex vessel and underwent successful insertion of a large Resolute DES stent. A nuclear perfusion study in May 2013  revealed an ejection fraction approximately 37%.   He has suffered a large right MCA CVA and had an implantable loop recorder to assess for potential atrial fibrillation.  He subsequently suffered bilateral submassive pulmonary emboli and has developed a cardiomyopathy with a drop in ejection fraction to 25-30% noted at that time.   A follow-up CT revealed a chronic filling defect in the distal left vertebral artery compatible with soft plaque or thromboembolic disease.  He has soft calcified plaque in the right vertebral artery region.  He had improved right middle cerebral artery patency since November 2015 without intracranial arterial occlusion.  There was evidence for his chronic right MCA infarct without acute intracranial abnormality.  As his stroke, he had significant purposeful weight loss of approximately 170 pounds.  He was diagnosed as having severe sleep apnea with an AHI of 36.1/h; however, on the diagnostic portion of the test he was not able to achieve any rem sleep.  He has been on CPAP therapy since February 28, 2017.  I reviewed his most recent download.  He continues to meet compliance standards and is averaging just under 8 hours of CPAP use per night.  At his 11 cm water pressure, AHI is excellent at 1.6.  Several months ago he had undergone repeat laboratory by Dr. Damita Dunnings.  Remotely he had iron deficiency which ultimately improved.  He admits to a 40 pound weight gain over the past year and admits to excess eating.  Review of his records indicates that he has not had a nuclear perfusion study since suffering his  stroke in 2015.  I have recommended he undergo a 5-year follow-up [place] study for reassessment of his coronary perfusion.  His weight today is up to 264 pounds and weight loss was recommended.  He has not had recent lipid studies.  He continues to be on rosuvastatin 40 mg.  LDL cholesterol in July 2019 was 69.  He continues to take pantoprazole for GERD.  I am recommending follow-up chemistry lipid studies and thyroid studies.  He will be seeing Dr. Damita Dunnings back in follow-up next week and laboratory will be done during his evaluation.  I will see him back in the office in 3 months in follow-up of his [place] study further recommendations will be made at that time.  Time spent: 25 minutes [name], MD, [facility]  05/25/2018 5:31 PM

## 2018-05-26 ENCOUNTER — Telehealth: Payer: Self-pay | Admitting: *Deleted

## 2018-05-26 NOTE — Telephone Encounter (Signed)
Left message for patient to call and schedule Lexiscan myoview and 3 mos f/u with Dr. Claiborne Billings

## 2018-05-27 ENCOUNTER — Telehealth (HOSPITAL_COMMUNITY): Payer: Self-pay

## 2018-05-27 NOTE — Telephone Encounter (Signed)
Encounter complete. 

## 2018-05-29 ENCOUNTER — Encounter (HOSPITAL_COMMUNITY): Payer: Self-pay

## 2018-05-29 ENCOUNTER — Ambulatory Visit (HOSPITAL_COMMUNITY)
Admission: RE | Admit: 2018-05-29 | Discharge: 2018-05-29 | Disposition: A | Discharge status: Home / Self Care | Payer: Medicare Other | Source: Ambulatory Visit | Attending: Cardiology | Admitting: Cardiology

## 2018-05-29 DIAGNOSIS — Z951 Presence of aortocoronary bypass graft: Secondary | ICD-10-CM | POA: Insufficient documentation

## 2018-05-29 DIAGNOSIS — G4733 Obstructive sleep apnea (adult) (pediatric): Secondary | ICD-10-CM | POA: Insufficient documentation

## 2018-05-29 DIAGNOSIS — E785 Hyperlipidemia, unspecified: Secondary | ICD-10-CM | POA: Insufficient documentation

## 2018-05-29 LAB — MYOCARDIAL PERFUSION IMAGING
LV dias vol: 221 mL (ref 62–150)
LV sys vol: 146 mL
Peak HR: 85 {beats}/min
Rest HR: 50 {beats}/min
SDS: 6
SRS: 18
SSS: 24
TID: 1.53

## 2018-05-29 MED ORDER — TECHNETIUM TC 99M TETROFOSMIN IV KIT
9.9000 | PACK | Freq: Once | INTRAVENOUS | Status: AC | PRN
  Administered 2018-05-29: 9.9 via INTRAVENOUS
  Filled 2018-05-29: qty 10

## 2018-05-29 MED ORDER — TECHNETIUM TC 99M TETROFOSMIN IV KIT
32.3000 | PACK | Freq: Once | INTRAVENOUS | Status: AC | PRN
  Administered 2018-05-29: 32.3 via INTRAVENOUS
  Filled 2018-05-29: qty 33

## 2018-05-29 MED ORDER — REGADENOSON 0.4 MG/5ML IV SOLN
0.4000 mg | Freq: Once | INTRAVENOUS | Status: AC
  Administered 2018-05-29: 0.4 mg via INTRAVENOUS

## 2018-05-29 NOTE — Progress Notes (Signed)
Patient unable to raise left arm for imaging due to previous stroke.

## 2018-06-02 ENCOUNTER — Ambulatory Visit: Payer: Medicare Other | Admitting: Family Medicine

## 2018-06-02 ENCOUNTER — Other Ambulatory Visit: Payer: Self-pay

## 2018-06-02 ENCOUNTER — Encounter: Payer: Self-pay | Admitting: Family Medicine

## 2018-06-02 ENCOUNTER — Ambulatory Visit (INDEPENDENT_AMBULATORY_CARE_PROVIDER_SITE_OTHER): Payer: Medicare Other | Admitting: Family Medicine

## 2018-06-02 VITALS — BP 124/64 | HR 64 | Temp 97.7°F | Ht 72.0 in | Wt 268.4 lb

## 2018-06-02 DIAGNOSIS — F32A Depression, unspecified: Secondary | ICD-10-CM

## 2018-06-02 DIAGNOSIS — F329 Major depressive disorder, single episode, unspecified: Secondary | ICD-10-CM | POA: Diagnosis not present

## 2018-06-02 DIAGNOSIS — D509 Iron deficiency anemia, unspecified: Secondary | ICD-10-CM | POA: Diagnosis not present

## 2018-06-02 DIAGNOSIS — I5042 Chronic combined systolic (congestive) and diastolic (congestive) heart failure: Secondary | ICD-10-CM

## 2018-06-02 LAB — IRON: Iron: 108 ug/dL (ref 42–165)

## 2018-06-02 MED ORDER — SERTRALINE HCL 50 MG PO TABS: 75.0000 mg | ORAL_TABLET | Freq: Every day | ORAL | 3 refills | Status: DC

## 2018-06-02 NOTE — Patient Instructions (Addendum)
Go to the lab on the way out.  We'll contact you with your lab report. Increase your sertraline up to 75mg  a day.  Take care.  Glad to see you.

## 2018-06-02 NOTE — Progress Notes (Signed)
Recent stress test done, d/w pt. I will await cards input.     The left ventricular ejection fraction is moderately decreased (30-44%).  Nuclear stress EF: 34%.  There was no ST segment deviation noted during stress.  Defect 1: There is a large defect of severe severity present in the basal inferolateral, mid inferolateral, apical anterior, apical septal, apical inferior, apical lateral and apex location.  Findings consistent with prior myocardial infarction.  This is an intermediate risk study.  He is sleeping through the night and not having as much nocturia.   This is a good change for the patient.  H/o low iron.  Still anticoagulated.  He was able to cut back on senokot.  Repeat labs pending for today.  Weight has gradually crept up, with less exercise.  He thought it was a true inc in weight, not just fluid.  Not SOB.  Not SOB supine.  No CP.    Mood d/w pt.  He is still irritable.  He did worse off sertraline.  Taking 50mg  a day now, d/w pt about inc to 75mg  a day.  He agreed to increase and then update me as needed.  PMH and SH reviewed  ROS: Per HPI unless specifically indicated in ROS section   Meds, vitals, and allergies reviewed.   GEN: nad, alert and oriented HEENT: mucous membranes moist,OP wnl  NECK: supple w/o LA CV: rrr.   PULM: ctab, no inc wob EXT: Left ankle minimally puffy at baseline SKIN: no acute rash Left hemiparesis noted at baseline Speech and judgment appear intact.

## 2018-06-03 LAB — COMPREHENSIVE METABOLIC PANEL
ALT: 21 IU/L (ref 0–44)
AST: 21 IU/L (ref 0–40)
Albumin/Globulin Ratio: 1.4 (ref 1.2–2.2)
Albumin: 3.9 g/dL (ref 3.8–4.8)
Alkaline Phosphatase: 67 IU/L (ref 39–117)
BUN/Creatinine Ratio: 17 (ref 10–24)
BUN: 17 mg/dL (ref 8–27)
Bilirubin Total: 0.3 mg/dL (ref 0.0–1.2)
CO2: 25 mmol/L (ref 20–29)
Calcium: 8.7 mg/dL (ref 8.6–10.2)
Chloride: 106 mmol/L (ref 96–106)
Creatinine, Ser: 1 mg/dL (ref 0.76–1.27)
GFR calc Af Amer: 89 mL/min/{1.73_m2} (ref >59)
GFR calc non Af Amer: 77 mL/min/{1.73_m2} (ref >59)
Globulin, Total: 2.7 g/dL (ref 1.5–4.5)
Glucose: 90 mg/dL (ref 65–99)
Potassium: 4.1 mmol/L (ref 3.5–5.2)
Sodium: 143 mmol/L (ref 134–144)
Total Protein: 6.6 g/dL (ref 6.0–8.5)

## 2018-06-03 LAB — CBC
Hematocrit: 41.6 % (ref 37.5–51.0)
Hemoglobin: 14.3 g/dL (ref 13.0–17.7)
MCH: 29.6 pg (ref 26.6–33.0)
MCHC: 34.4 g/dL (ref 31.5–35.7)
MCV: 86 fL (ref 79–97)
Platelets: 137 10*3/uL — ABNORMAL LOW (ref 150–450)
RBC: 4.83 x10E6/uL (ref 4.14–5.80)
RDW: 12.3 % (ref 11.6–15.4)
WBC: 6.4 10*3/uL (ref 3.4–10.8)

## 2018-06-03 LAB — LIPID PANEL
Chol/HDL Ratio: 2.4 ratio (ref 0.0–5.0)
Cholesterol, Total: 134 mg/dL (ref 100–199)
HDL: 57 mg/dL (ref >39)
LDL Calculated: 65 mg/dL (ref 0–99)
Triglycerides: 59 mg/dL (ref 0–149)
VLDL Cholesterol Cal: 12 mg/dL (ref 5–40)

## 2018-06-03 LAB — TSH: TSH: 1.64 u[IU]/mL (ref 0.450–4.500)

## 2018-06-03 NOTE — Assessment & Plan Note (Signed)
Reasonable to increase sertraline to 75 mg a day and then update me as needed.  We may need to increase to 100 mg later on.  Routine timeline for effect discussed with patient.  >25 minutes spent in face to face time with patient, >50% spent in counselling or coordination of care.

## 2018-06-03 NOTE — Assessment & Plan Note (Signed)
I am awaiting cardiology input.  Discussed with patient.

## 2018-06-03 NOTE — Assessment & Plan Note (Signed)
No change in meds at this point.  See notes on labs.  He agrees.

## 2018-06-10 ENCOUNTER — Other Ambulatory Visit: Payer: Self-pay | Admitting: Cardiovascular Disease

## 2018-06-10 ENCOUNTER — Other Ambulatory Visit: Payer: Self-pay | Admitting: Family Medicine

## 2018-06-16 ENCOUNTER — Telehealth: Payer: Self-pay | Admitting: Family Medicine

## 2018-06-16 NOTE — Telephone Encounter (Signed)
LVM pt is due for AMV and physical with Dr. Damita Dunnings. If possible please schedule a Webex for Medicare wellness. Thanks.

## 2018-07-05 ENCOUNTER — Encounter: Payer: Self-pay | Admitting: Cardiovascular Disease

## 2018-07-11 ENCOUNTER — Other Ambulatory Visit: Payer: Self-pay | Admitting: Cardiovascular Disease

## 2018-07-11 ENCOUNTER — Other Ambulatory Visit: Payer: Self-pay | Admitting: Family Medicine

## 2018-07-13 NOTE — Telephone Encounter (Signed)
Pt called back and set up 6/26 appt. Please send meds to [facility], [place].

## 2018-07-13 NOTE — Telephone Encounter (Signed)
Pharmacy request refill on the following:  1. Midodrine 2.5mg  - I do not see where Dr. Damita Dunnings has ever filled this medication for patient.  2.  QC Stool Softener Pls Laxative 8.6/50 LAST REFILLED: #60, 2 Ref on 12/10/17  LAST OV: 06/02/18 NEXT OV: None scheduled, is due for follow up on June 1st.   Will pend for Dr. Carole Civil review, Also, will copy Bridgette on this message string so she can help get patient set up for follow up visit.

## 2018-07-13 NOTE — Telephone Encounter (Signed)
Lvm for pt to call and set up 3 month follow up in June.

## 2018-07-14 ENCOUNTER — Other Ambulatory Visit: Payer: Self-pay | Admitting: Family Medicine

## 2018-07-14 NOTE — Telephone Encounter (Signed)
Spoke with wife, [name], she reports that he is taking 2.5mg   (1 daily in the morning).  She states this has been doing well for him.  I have made the changes and will pend for Dr. Josefine Class review and approval.

## 2018-07-14 NOTE — Telephone Encounter (Signed)
This is a repeat request. RX has already been filled on today. Thanks.

## 2018-07-14 NOTE — Telephone Encounter (Signed)
I sent the stool softener.  Please verify his midodrine dose and frequency of use.  Let me know and I will send it in.  Thanks.

## 2018-07-15 ENCOUNTER — Encounter: Payer: Self-pay | Admitting: Family Medicine

## 2018-07-15 ENCOUNTER — Ambulatory Visit (INDEPENDENT_AMBULATORY_CARE_PROVIDER_SITE_OTHER): Payer: Medicare Other | Admitting: Family Medicine

## 2018-07-15 VITALS — BP 100/55 | HR 95 | Temp 100.8°F | Ht 72.0 in

## 2018-07-15 DIAGNOSIS — W57XXXA Bitten or stung by nonvenomous insect and other nonvenomous arthropods, initial encounter: Secondary | ICD-10-CM

## 2018-07-15 DIAGNOSIS — S40862A Insect bite (nonvenomous) of left upper arm, initial encounter: Secondary | ICD-10-CM | POA: Diagnosis not present

## 2018-07-15 DIAGNOSIS — R69 Illness, unspecified: Secondary | ICD-10-CM

## 2018-07-15 MED ORDER — DOXYCYCLINE HYCLATE 100 MG PO TABS: 100.0000 mg | ORAL_TABLET | Freq: Two times a day (BID) | ORAL | 0 refills | Status: AC

## 2018-07-15 NOTE — Progress Notes (Signed)
Gregory Wiley. Gregory Vanderheyden, MD [facility] [place] [facility] [place], [address] Phone: [phone]  FAX: [phone]  Gregory Wiley - 68 y.o. male  MRN [id number]  Date of Birth: 04/22/1950  Visit Date: 07/15/2018  PCP: Tonia Ghent, MD  Referred by: Tonia Ghent, MD Chief Complaint  Patient presents with  . Headache  . Fever  . Emesis  . Cough  . Insect Bite    Tick bite on 07/03/2018   Virtual Visit via Video Note:  I connected with  Gregory Wiley on 07/15/2018  9:00 AM EDT by a video enabled telemedicine application and verified that I am speaking with the correct person using two identifiers.   Location patient: home computer, tablet, or smartphone Location provider: work or home office Consent: Verbal consent directly obtained from Gregory Wiley. Persons participating in the virtual visit: patient, provider  I discussed the limitations of evaluation and management by telemedicine and the availability of in person appointments. The patient expressed understanding and agreed to proceed.  History of Present Illness:  He is generally not feeling well, and he pulled off what sounds like a [place] tick approximately 2 weeks ago from his left forearm.  There is still a small bump, and he does not have a bull's-eye lesion.  He has been feeling fairly poorly and has had a temperature in the low to mid 100s in the last couple of days.  He does have some mild aching.  Does have some pain behind his eyes and some nausea.  He does have chronic diarrhea.  He does have some minimal cough, and he did vomit a couple of times.  Review of Systems as above: See pertinent positives and pertinent negatives per HPI No acute distress verbally  Past Medical History, Surgical History, Social History, Family History, Problem List, Medications, and Allergies have been reviewed and updated if relevant.    Observations/Objective/Exam:  An attempt was made to discern vital signs over the phone and per patient if applicable and possible.   General:    Alert, Oriented, appears well and in no acute distress HEENT:     Atraumatic, conjunctiva clear, no obvious abnormalities on inspection of external nose and ears.  Neck:    Normal movements of the head and neck Pulmonary:     On inspection no signs of respiratory distress, breathing rate appears normal, no obvious gross SOB, gasping or wheezing Cardiovascular:    No obvious cyanosis Musculoskeletal:    Moves all visible extremities without noticeable abnormality Psych / Neurological:     Pleasant and cooperative, no obvious depression or anxiety, speech and thought processing grossly intact  Assessment and Plan:  Illness  Tick bite of left upper arm, initial encounter  >15 minutes spent in face to face time with patient, >50% spent in counselling or coordination of care   In this setting, think that you have to be concerned with possible tickborne illness.  High risk in [place].  Will treat with Doxy with multiple symptoms that could go along with this.  He could also just have a bad viral syndrome.  I discussed the assessment and treatment plan with the patient. The patient was provided an opportunity to ask questions and all were answered. The patient agreed with the plan and demonstrated an understanding of the instructions.   The patient was advised to call back or seek an in-person evaluation  if the symptoms worsen or if the condition fails to improve as anticipated.  Follow-up: prn unless noted otherwise below No follow-ups on file.  Meds ordered this encounter  Medications  . doxycycline (VIBRA-TABS) 100 MG tablet    Sig: Take 1 tablet (100 mg total) by mouth 2 (two) times daily for 14 days.    Dispense:  28 tablet    Refill:  0   No orders of the defined types were placed in this encounter.   Signed,  Maud Deed. Enzley Kitchens, MD

## 2018-07-15 NOTE — Telephone Encounter (Signed)
Sent. Thanks.   

## 2018-08-17 ENCOUNTER — Telehealth (INDEPENDENT_AMBULATORY_CARE_PROVIDER_SITE_OTHER): Payer: Medicare Other | Admitting: Cardiovascular Disease

## 2018-08-17 VITALS — BP 116/59 | HR 62 | Temp 98.1°F | Ht 72.0 in | Wt 254.8 lb

## 2018-08-17 DIAGNOSIS — I25810 Atherosclerosis of coronary artery bypass graft(s) without angina pectoris: Secondary | ICD-10-CM | POA: Diagnosis not present

## 2018-08-17 DIAGNOSIS — I255 Ischemic cardiomyopathy: Secondary | ICD-10-CM

## 2018-08-17 DIAGNOSIS — Z951 Presence of aortocoronary bypass graft: Secondary | ICD-10-CM

## 2018-08-17 DIAGNOSIS — G4733 Obstructive sleep apnea (adult) (pediatric): Secondary | ICD-10-CM | POA: Diagnosis not present

## 2018-08-17 DIAGNOSIS — E785 Hyperlipidemia, unspecified: Secondary | ICD-10-CM

## 2018-08-17 DIAGNOSIS — Z7901 Long term (current) use of anticoagulants: Secondary | ICD-10-CM

## 2018-08-17 DIAGNOSIS — G4719 Other hypersomnia: Secondary | ICD-10-CM

## 2018-08-17 NOTE — Patient Instructions (Signed)

## 2018-08-17 NOTE — Progress Notes (Signed)
Virtual Visit via Video Note   This visit type was conducted due to national recommendations for restrictions regarding the COVID-19 Pandemic (e.g. social distancing) in an effort to limit this patient's exposure and mitigate transmission in our community.  Due to his co-morbid illnesses, this patient is at least at moderate risk for complications without adequate follow up.  This format is felt to be most appropriate for this patient at this time.  All issues noted in this document were discussed and addressed.  A limited physical exam was performed with this format.  Please refer to the patient's chart for his consent to telehealth for [facility].   Date:  08/17/2018   ID:  Gregory Wiley, DOB 1950/08/22, MRN [id number]  Patient Location: Home Provider Location: Office  PCP:  Tonia Ghent, MD  Cardiologist:  Shelva Majestic, MD Electrophysiologist:  None   Evaluation Performed:  Follow-Up Visit  Chief Complaint: Follow-up evaluation for sleep apnea and CAD.  History of Present Illness:    Gregory Wiley is a 68 y.o. male who has established CAD and underwent stenting of his RCA in May 2011. He is s/p CABG surgery with LIMA to the LAD, sequential vein to the distal RCA and PDA, and vein to the acute marginal branch of his right coronary artery. In April 2013 he developed unstable angina and ruled in for non-ST segment elevation myocardial infarction. Catheterization revealed 99% stenosis of the large left circumflex vessel and he underwent successful stenting with insertion of a 4.0x18 mm resolute DES stent post dilated to 4.5 mm.  Additional problems include obstructive sleep apnea. On his initial polysomnogram in 2010 AHI was 11.3 overall, but during REM sleep he had moderate sleep apnea with an AHI of 24.8. He never did initiate CPAP therapy at that time. However, since his myocardial infarction he has been using CPAP therapy since July 2013.  When I last saw him, he was only  utilizing CPAP intermittently.  He now admits to much more frequent use but there are times when he does not use treatment.  He has a history of hyperlipidemia, hypertension, and morbid obesity. An echo Doppler study in May 2013 suggested an ejection fraction of 38% and on nuclear imaging  was 37% with scar in the LAD territory.  He was admitted to [facility] in November 2015 with a large right MCA territory CVA.  TEE was negative for embolic source.  EF was 45% on echocardiogram.  He underwent implantable loop recorder by Dr. Rayann Heman. He was readmitted with bilateral submassive pulmonary embolism.  Venous Dopplers were positive for DVT.  Ejection fraction was 25-30%, which is felt to be contributed by strain from his pulmonary emboli.  He had mild troponin elevation which was felt to be due to demand ischemia and has been on Xarelto for anticoagulation.  He was admitted on the [facility] hospitalist service on 11/11/2014 with dehydration.  Several of his medications were held.  I reviewed his hospital record.  He had a history of nausea and vomiting that had been going on for approximatey a month.  He has continued to feel weak and in his words, "feels terrible" since his hospitalization.  He has had issues with headaches which he did see neurology.  He has not been eating well.  He seeing Dr. Damita Dunnings at [facility] for primary care.  He denies chest pressure.  He denies fevers.  He is on Xarelto anticoagulation and denies overt bleeding.  There is a  remote history of erosive gastroduodenitis, GERD, esophageal stricture for which in the past he had been seen by Dr. Fuller Plan.  When I previously saw him, he was tachycardic which I felt was contributed by his Ritalin.  He has since been off this medication.   In November 2017, he had removal of his loop recorder by Dr. Rayann Heman  which had been initially placed at that in 2015 following his large right middle cerebral artery stroke.  Over the last several years  he admits to a purposeful 170 pound weight loss with a peak weight at 346 down to 193.  His initial sleep study was in September 2010.  When I last saw him in September 2018.  He complained of significant fatigability.  An Epworth Sleepiness Scale score was calculated and this endorsed at 15 which is consistent with excessive daytime sleepiness.  He also has noticed lower extremity edema.    He underwent a sleep study on 12/14/2016.  He was found to have severe sleep apnea with an AHI of 36.1 and RDI 40.4.  CPAP was started at 5 cm and was titrated up to 12 cm.  Oxygen desaturation was 87%.  He was unable to achieve any REM sleep on the diagnostic portion of the study.  He was set up with CPAP on 02/28/2017.  DME's [facility].  Hollace Kinnier was obtained from January 24 through separate 22 2019.  He is compliant with 97% of usage stays and 87% of usage greater than 4 hours.  He is averaging 7 hours and 33 minutes of sleep per night.  At 11 cm water pressure.  AHI is 1.2.  He does have moderate leak almost on a daily basis.  He has been using nasal pillows, but upon further questioning, there is significant oral venting and mouth breathing.  Epworth Sleepiness Scale score endorsed at 17 today, which is compatible with residual daytime sleepiness.   I saw him in February 2019.  At that time, I reviewed his most recent sleep study in detail.  I last saw him on 05/25/2018 and over the past year, he has been followed by Dr. [name].  He had issues with iron deficiency anemia.  He denies any episodes of chest pain.  He continues to use CPAP with excellent compliance.  He admits to a 40 pound weight gain over the past year.  Not been very active and has been eating more.  A download was obtained in the office of his CPAP unit from April 25, 2018 through May 24, 2018.  Usage days was 97% with usage greater than 4 hours 90%.  Average usage was 7 hours and 53 minutes.  At a 11 cm set pressure, AHI is  excellent at 1.6.  There are several days of increased leak of his mask.  His home medical is his DME company.  During that evaluation,  I recommended that he undergo a 5-year follow-up nuclear perfusion study for reassessment of his coronary perfusion.  In addition, repeat laboratory was recommended on his current medical regimen.  He underwent his nuclear perfusion study on May 29, 2018.  This revealed it is for prior left scar most likely involving the circumflex territory soon with his prior MI without associated ischemia.  Nuclear EF was 34%.  He denies any recurrent chest pain or anginal type symptoms.  He has continued to use CPAP and admits to excellent compliance.  A new download was obtained in the office today from March 21 through  July 05, 2018.  He is compliant with 97% of usage days.  He is averaging 7 hours and 53 minutes of CPAP use.  At 11 cm water pressure AHI was excellent at 1.6.  However, he states that he is continue to use CPAP consistently.  Apparently his machine is now malfunctioning and is not able to generate data to allow additional reporting.  His DME company is [facility]. He typically goes to bed between 9 and 10 PM but often times may wake up at 330 to go to the bathroom.  Rather than going back to sleep he often goes to a chair and watches television.  He admits to residual daytime sleepiness.  He presents for evaluation..    The patient does not have symptoms concerning for COVID-19 infection (fever, chills, cough, or new shortness of breath).    Past Medical History:  Diagnosis Date   Adrenal insufficiency ([place])    Anxiety    Basal cell carcinoma of skin of left ankle    Bleeding stomach ulcer 2015   Coronary artery disease    stent, then CABG 07/20/10   Daily headache    "for the past month" (11/11/2014)   Depression    Diverticulosis    GERD (gastroesophageal reflux disease)    Gout    H/O cardiomyopathy    ischemic, now last echo  07/30/11, EF 38%W   History of hiatal hernia    Hyperlipidemia    Hypertension    Internal hemorrhoids    Left hemiplegia ([place])    NSTEMI (non-ST elevated myocardial infarction) ([place])    NSTEMI- last cath 06/2011-Stent to LCX-DES, last nuc 07/30/11 low risk   Obesity (BMI 30-39.9)    OSA on CPAP    since July 2013- uses CPAP sometimes , states he has lost 147 lbs. since stroke & doesn't use the CPAP as much as he use to.    Plantar fasciitis of left foot    Pneumonia    hosp. 12/2014   Pulmonary embolism ([place]) 03/2014   Stroke ([place]) 01/2014   "no control of LUE, can slightly LLE; memory problems since" (11/11/2014)   Tubular adenoma of colon    Past Surgical History:  Procedure Laterality Date   BASAL CELL CARCINOMA EXCISION Left 2015   ankle   CARDIAC SURGERY     CATARACT EXTRACTION Bilateral    COLONOSCOPY N/A 02/10/2013   Procedure: COLONOSCOPY;  Surgeon: Ladene Artist, MD;  Location: [facility] ENDOSCOPY;  Service: Endoscopy;  Laterality: N/A;   CORONARY ANGIOPLASTY WITH STENT PLACEMENT  07/01/2011   DES-Resolute to native LCX   CORONARY ANGIOPLASTY WITH STENT PLACEMENT  12/01/2007   COMPLEX 5 LESION PCI INCLUDING CUTTING BALLOON AND 4 CYPHER DESs   CORONARY ARTERY BYPASS GRAFT  07/20/2010    LIMA-LAD; VG-ACUTE MARG of RCA; Seq VG-distal RCA & then pda   EP IMPLANTABLE DEVICE N/A 02/14/2016   Procedure: Loop Recorder Removal;  Surgeon: Thompson Grayer, MD;  Location: [place] CV LAB;  Service: Cardiovascular;  Laterality: N/A;   ESOPHAGOGASTRODUODENOSCOPY  01/2014   gastric ulcer, erosive gastroduodenitis, H Pylori negative.    ESOPHAGOGASTRODUODENOSCOPY (EGD) WITH PROPOFOL N/A 11/14/2014   Procedure: ESOPHAGOGASTRODUODENOSCOPY (EGD) WITH PROPOFOL;  Surgeon: Milus Banister, MD;  Location: [place] [facility];  Service: Endoscopy;  Laterality: N/A;   HAND SURGERY Right    to take glass out   HERNIA REPAIR     INTRAMEDULLARY (IM) NAIL INTERTROCHANTERIC Right 02/07/2015    Procedure: INTRAMEDULLARY (IM) NAIL  INTERTROCHANTRIC RIGHT HIP;  Surgeon: Renette Butters, MD;  Location: [facility];  Service: Orthopedics;  Laterality: Right;   KNEE ARTHROSCOPY Right    LEFT HEART CATHETERIZATION WITH CORONARY/GRAFT ANGIOGRAM  07/01/2011   Procedure: LEFT HEART CATHETERIZATION WITH Beatrix Fetters;  Surgeon: Sanda Klein, MD;  Location: [place] CATH LAB;  Service: Cardiovascular;;   LOOP RECORDER IMPLANT  02/15/2014   MDT LINQ implanted by Dr Rayann Heman for cryptogenic stroke   PERCUTANEOUS CORONARY STENT INTERVENTION (PCI-S) Right 07/01/2011   Procedure: PERCUTANEOUS CORONARY STENT INTERVENTION (PCI-S);  Surgeon: Sanda Klein, MD;  Location: [facility] CATH LAB;  Service: Cardiovascular;  Laterality: Right;   PICC LINE PLACE PERIPHERAL ([place] HX)  06/2015   RETINAL DETACHMENT SURGERY Right    TEE WITHOUT CARDIOVERSION N/A 02/15/2014   Procedure: TRANSESOPHAGEAL ECHOCARDIOGRAM (TEE);  Surgeon: Josue Hector, MD;  Location: [facility] ENDOSCOPY;  Service: Cardiovascular;  Laterality: N/A;   UMBILICAL HERNIA REPAIR  2006     Current Meds  Medication Sig   acetaminophen (TYLENOL) 325 MG tablet Take 1 tablet (325 mg total) by mouth every 6 (six) hours as needed for mild pain.   albuterol (PROVENTIL HFA;VENTOLIN HFA) 108 (90 Base) MCG/ACT inhaler Inhale 2 puffs into the lungs every 6 (six) hours as needed for wheezing or shortness of breath.   Armodafinil (NUVIGIL) 200 MG TABS Take 1 po qAM   cholecalciferol (VITAMIN D) 1000 units tablet Take 1,000 Units by mouth daily.   doxepin (SINEQUAN) 10 MG capsule TAKE 1 CAPSULE (10 MG TOTAL) BY MOUTH AT BEDTIME.   ELIQUIS 5 MG TABS tablet TAKE 1 TABLET BY MOUTH 2 TIMES DAILY.   FEROSUL 325 (65 Fe) MG tablet TAKE 1 TABLET BY MOUTH 2 TIMES DAILY WITH A MEAL.   fluticasone (FLONASE) 50 MCG/ACT nasal spray Place 2 sprays into both nostrils daily. (Patient taking differently: Place 2 sprays into both nostrils daily as needed. )   gabapentin  (NEURONTIN) 300 MG capsule Take 1 capsule (300 mg total) by mouth at bedtime.   HYDROcodone-acetaminophen (NORCO/VICODIN) 5-325 MG tablet Take 1 tablet by mouth daily as needed for moderate pain.   hydrocortisone (ANUSOL-HC) 25 MG suppository Place 1 suppository (25 mg total) rectally 2 (two) times daily as needed. X 1 week; then, use as needed thereafter.   Melatonin 10 MG CAPS TAKE 1 CAPSULE BY MOUTH AT BEDTIME AS NEEDED FOR SLEEP   midodrine (PROAMATINE) 2.5 MG tablet Take 1 tablet (2.5 mg total) by mouth daily.   nitroGLYCERIN (NITROSTAT) 0.4 MG SL tablet Place 1 tablet (0.4 mg total) under the tongue every 5 (five) minutes as needed for chest pain.   NONFORMULARY OR COMPOUNDED ITEM Supplement menmoringa oleifera taken daily.   nystatin (MYCOSTATIN/NYSTOP) powder Apply topically 2 (two) times daily.   pantoprazole (PROTONIX) 40 MG tablet TAKE 1 TABLET BY MOUTH 2 TIMES DAILY.   potassium chloride (K-DUR) 10 MEQ tablet TAKE 1 TABLETS BY MOUTH 2 TIMES DAILY.   PRESCRIPTION MEDICATION Inhale into the lungs at bedtime. CPAP   prochlorperazine (COMPAZINE) 10 MG tablet Take 1 tablet (10 mg total) by mouth every 8 (eight) hours as needed (headache, nausea or vomiting).   QC LO-DOSE ASPIRIN 81 MG EC tablet TAKE 1 TABLET (81 MG TOTAL) BY MOUTH DAILY. SWALLOW WHOLE.   QC STOOL SOFTENER PLS LAXATIVE 8.6-50 MG tablet TAKE 1 TABLET BY MOUTH 2 TIMES DAILY.   rosuvastatin (CRESTOR) 40 MG tablet TAKE 1 TABLET BY MOUTH DAILY.   senna (SENOKOT) 8.6 MG tablet Take 1 tablet  by mouth 2 (two) times daily.   sertraline (ZOLOFT) 50 MG tablet Take 1.5 tablets (75 mg total) by mouth daily.   tiZANidine (ZANAFLEX) 4 MG capsule Take 1 capsule (4 mg total) by mouth as needed.     Allergies:   Diphenhydramine hcl; Adhesive [tape]; Proscar [finasteride]; and Tamsulosin   Social History   Tobacco Use   Smoking status: Former Smoker    Packs/day: 2.00    Years: 4.00    Pack years: 8.00    Types:  Cigarettes    Last attempt to quit: 01/30/1969    Years since quitting: 49.5   Smokeless tobacco: Never Used   Tobacco comment: "quit smoking cigarettes  in the 1970's"  Substance Use Topics   Alcohol use: No    Alcohol/week: 0.0 standard drinks   Drug use: No    Comment: "smoked some pot in college"     Family Hx: The patient's family history includes Brain cancer in his maternal grandmother; CAD (age of onset: 57) in his father; COPD in his paternal grandfather; Coronary artery disease in an other family member; Diabetes in his brother; Diabetes type II in his mother; Heart attack (age of onset: 38) in his father; Hyperlipidemia in his brother; Hypertension in his brother; Other in an other family member. There is no history of Colon cancer, Stroke, or Prostate cancer.  ROS:   Please see the history of present illness.    No fevers chills night sweats, change in taste or smell. No cough No change in vision or hearing No wheezing No recurrent anginal type symptoms Mild shortness of breath with activity No awareness of recurrent palpitations No bleeding No rash OSA on CPAP with residual daytime sleepiness All other systems reviewed and are negative.   Prior CV studies:   The following studies were reviewed today:  Nuclear Study Highlights 05/29/2018    The left ventricular ejection fraction is moderately decreased (30-44%).  Nuclear stress EF: 34%.  There was no ST segment deviation noted during stress.  Defect 1: There is a large defect of severe severity present in the basal inferolateral, mid inferolateral, apical anterior, apical septal, apical inferior, apical lateral and apex location.  Findings consistent with prior myocardial infarction.  This is an intermediate risk study.   Findings consistent with prior infarct but no ischemia, study with intermediate risk given decreased LVEF.      Labs/Other Tests and Data Reviewed:    EKG:  An ECG dated  05/25/2018 was personally reviewed today and demonstrated:  Normal sinus rhythm at 64 bpm.  Left axis deviation.  LVH with repolarization changes.  QS complex V1 through V4 QTc interval 468 ms.  Recent Labs: 10/28/2017: Pro B Natriuretic peptide (BNP) 73.0 06/02/2018: ALT 21; BUN 17; Creatinine, Ser 1.00; Hemoglobin 14.3; Platelets 137; Potassium 4.1; Sodium 143; TSH 1.640   Recent Lipid Panel Lab Results  Component Value Date/Time   CHOL 134 06/02/2018 12:00 AM   CHOL 199 01/04/2013 12:06 PM   TRIG 59 06/02/2018 12:00 AM   TRIG 113 01/04/2013 12:06 PM   HDL 57 06/02/2018 12:00 AM   HDL 47 01/04/2013 12:06 PM   CHOLHDL 2.4 06/02/2018 12:00 AM   CHOLHDL 2 08/30/2016 03:08 PM   LDLCALC 65 06/02/2018 12:00 AM   LDLCALC 69 10/10/2017    Wt Readings from Last 3 Encounters:  08/17/18 254 lb 12.8 oz (115.6 kg)  06/02/18 268 lb 7 oz (121.8 kg)  05/29/18 264 lb (119.7 kg)  Objective:    Vital Signs:  BP (!) 116/59    Pulse 62    Temp 98.1 F (36.7 C)    Ht 6' (1.829 m)    Wt 254 lb 12.8 oz (115.6 kg)    BMI 34.56 kg/m    On exam he was in no acute distress and was fully bearded. He had normal respirations. HEENT was unchanged from previously.  I could not assess his neck due to his full beard. There was no audible wheezing There was no chest discomfort to palpation. His abdomen was nontender by palpation. His heart rhythm was regular by palpation He denied any leg swelling Denied any neurologic symptoms. He had a normal mood and affect  ASSESSMENT & PLAN:    1. CAD, status post CABG/Ischemic Cardiomyopathy: Mr. Gertie Exon is status post PCI stenting to his RCA in May 2001 with subsequent CABG revascularization surgery in May 2012.  2013 he was found to have subtotal stenosis in a very large circumflex vessel and underwent insertion of a large Resolute DES stent.  Nuclear perfusion study in May 2013 showed an EF of 37%.  I reviewed his most recent nuclear perfusion study from March  2020 which essentially is unchanged.  EF was 34%.  There was evidence for prior scar without associated ischemia. 2. OSA on CPAP: I obtained a new download of his CPAP from March 21 through July 05, 2018.  Compliance is excellent with 97% of usage days and averaging 7 hours and 53 minutes of CPAP use.  At 11 cm water pressure AHI is 1.6.  At times there is an occasional leak.  I discussed with him improved sleep hygiene.  Typically he has been waking up at 3:30 AM when often he has to urinate and typically watches television in a chair subsequently.  I discussed with him the importance of trying to go back to sleep after he wakes up for urination.  We discussed that the preponderance of REM sleep occurs in the second half of the night and if he has reduced REM sleep duration, this may contribute to his residual daytime sleepiness.  He continues to be on nuvigil. 3. History of large right MCA CVA with improved right middle cerebral artery patency since November 2015 without subsequent intracranial arterial occlusion. 4. Upper lipidemia: Continues to be on statin 40 mg.  LDL cholesterol July 2019 was 69.  Recent lipid panel from June 02, 2018 was reviewed with the patient shows total cholesterol 134, triglycerides 59, HDL 57, and LDL 65. 5. Mild thrombocytopenia: Most recent CBC showed a platelet count of 137.  The patient is asymptomatic without bleeding. 6. Obesity: Has lost weight.  BMI is still elevated at 34.67.  Weight loss and increased exercise was recommended. 7. PAF:  maintaining regular rhythm without recurrent atrial fibrillation.  Continues to be on Eliquis for anticoagulation.  COVID-19 Education: The signs and symptoms of COVID-19 were discussed with the patient and how to seek care for testing (follow up with PCP or arrange E-visit).  The importance of social distancing was discussed today.  Time:   Today, I have spent 25 minutes with the patient with telehealth technology discussing the  above problems.     Medication Adjustments/Labs and Tests Ordered: Current medicines are reviewed at length with the patient today.  Concerns regarding medicines are outlined above.   Tests Ordered: No orders of the defined types were placed in this encounter.   Medication Changes: No orders of the defined types  were placed in this encounter.   Disposition:  Follow up 6 months  Signed, Shelva Majestic, MD  08/17/2018 4:02 PM    [facility]

## 2018-09-10 ENCOUNTER — Other Ambulatory Visit: Payer: Self-pay | Admitting: Neurology

## 2018-09-10 ENCOUNTER — Other Ambulatory Visit: Payer: Self-pay | Admitting: Family Medicine

## 2018-09-10 ENCOUNTER — Other Ambulatory Visit: Payer: Self-pay

## 2018-09-11 ENCOUNTER — Ambulatory Visit (INDEPENDENT_AMBULATORY_CARE_PROVIDER_SITE_OTHER): Payer: Medicare Other | Admitting: Family Medicine

## 2018-09-11 ENCOUNTER — Other Ambulatory Visit: Payer: Self-pay

## 2018-09-11 ENCOUNTER — Telehealth: Payer: Self-pay | Admitting: Family Medicine

## 2018-09-11 ENCOUNTER — Encounter: Payer: Self-pay | Admitting: Family Medicine

## 2018-09-11 VITALS — BP 108/64 | HR 78 | Temp 98.5°F | Resp 20 | Wt 257.4 lb

## 2018-09-11 DIAGNOSIS — D649 Anemia, unspecified: Secondary | ICD-10-CM

## 2018-09-11 DIAGNOSIS — F329 Major depressive disorder, single episode, unspecified: Secondary | ICD-10-CM | POA: Diagnosis not present

## 2018-09-11 DIAGNOSIS — L609 Nail disorder, unspecified: Secondary | ICD-10-CM

## 2018-09-11 DIAGNOSIS — F32A Depression, unspecified: Secondary | ICD-10-CM

## 2018-09-11 MED ORDER — SERTRALINE HCL 100 MG PO TABS: 100.0000 mg | ORAL_TABLET | Freq: Every day | ORAL | 3 refills | Status: DC

## 2018-09-11 NOTE — Progress Notes (Signed)
He has been better since prev tx after tick bite.  No fevers now.    Due for f/u labs.  History of iron deficiency anemia.  We talked about his overall situation.  It may be safer for him to continue oral iron replacement along with anticoagulation and not go through with further evaluation.  He would need to make changes to his anticoagulation regimen to go through colonoscopy and he is not interested in that for multitude of reasons given his overall debility.  He agreed with continued iron replacement and avoiding further intervention assuming his blood counts were reasonable.  He still gets frustrated with his situation, from the previous stroke.  He prev inc'd SSRI to 75mg  sertraline w/o much effect.  He prev did better on higher dosage compared to now.  No suicidal homicidal intent.   L foot with elongated nails, d/w pt about podiatry referral.   Meds, vitals, and allergies reviewed.   ROS: Per HPI unless specifically indicated in ROS section   GEN: nad, alert and oriented In wheelchair at baseline. HEENT: ncat NECK: supple w/o LA CV: rrr. PULM: ctab, no inc wob ABD: soft, +bs EXT: no edema SKIN: Well-perfused. Left hemiparesis at baseline. Left foot with elongated nails.

## 2018-09-11 NOTE — Telephone Encounter (Signed)
bEst number [phone] [name] forgot to ask for rx for a new wheelchair  Please mail to pt

## 2018-09-11 NOTE — Patient Instructions (Addendum)
Plan on recheck in 3 months but update me in about 1 month.   We'll contact you with your lab report. Increase sertraline to 100mg  a day.  Take care.  Glad to see you.

## 2018-09-12 LAB — BASIC METABOLIC PANEL
BUN: 15 mg/dL (ref 7–25)
CO2: 29 mmol/L (ref 20–32)
Calcium: 8.9 mg/dL (ref 8.6–10.3)
Chloride: 106 mmol/L (ref 98–110)
Creat: 1.14 mg/dL (ref 0.70–1.25)
Glucose, Bld: 99 mg/dL (ref 65–99)
Potassium: 4.3 mmol/L (ref 3.5–5.3)
Sodium: 141 mmol/L (ref 135–146)

## 2018-09-12 LAB — CBC WITH DIFFERENTIAL/PLATELET
Absolute Monocytes: 360 cells/uL (ref 200–950)
Basophils Absolute: 61 cells/uL (ref 0–200)
Basophils Relative: 1 %
Eosinophils Absolute: 439 cells/uL (ref 15–500)
Eosinophils Relative: 7.2 %
HCT: 44.1 % (ref 38.5–50.0)
Hemoglobin: 14.7 g/dL (ref 13.2–17.1)
Lymphs Abs: 1806 cells/uL (ref 850–3900)
MCH: 29.2 pg (ref 27.0–33.0)
MCHC: 33.3 g/dL (ref 32.0–36.0)
MCV: 87.7 fL (ref 80.0–100.0)
MPV: 12.2 fL (ref 7.5–12.5)
Monocytes Relative: 5.9 %
Neutro Abs: 3434 cells/uL (ref 1500–7800)
Neutrophils Relative %: 56.3 %
Platelets: 148 10*3/uL (ref 140–400)
RBC: 5.03 10*6/uL (ref 4.20–5.80)
RDW: 13.1 % (ref 11.0–15.0)
Total Lymphocyte: 29.6 %
WBC: 6.1 10*3/uL (ref 3.8–10.8)

## 2018-09-12 LAB — IRON: Iron: 78 ug/dL (ref 50–180)

## 2018-09-12 NOTE — Telephone Encounter (Signed)
Sent. Thanks.   

## 2018-09-14 ENCOUNTER — Telehealth: Payer: Self-pay | Admitting: Cardiovascular Disease

## 2018-09-14 ENCOUNTER — Other Ambulatory Visit: Payer: Self-pay | Admitting: Cardiovascular Disease

## 2018-09-14 ENCOUNTER — Telehealth: Payer: Self-pay

## 2018-09-14 NOTE — Telephone Encounter (Signed)
Please find out what specific requirements they are going to need filled out on the order.  I do not think an order for generic wheelchair will suffice.  Thanks.

## 2018-09-14 NOTE — Assessment & Plan Note (Signed)
Due for f/u labs.  History of iron deficiency anemia.  We talked about his overall situation.  It may be safer for him to continue oral iron replacement along with anticoagulation and not go through with further evaluation.  He would need to make changes to his anticoagulation regimen to go through colonoscopy and he is not interested in that for multitude of reasons given his overall debility.  He agreed with continued iron replacement and avoiding further intervention assuming his blood counts were reasonable. >25 minutes spent in face to face time with patient, >50% spent in counselling or coordination of care.

## 2018-09-14 NOTE — Telephone Encounter (Signed)
Spoke with Gregory Wiley who says she needs a transport chair so that it will be lighter but would really like to have an Transport planner.  Gregory Wiley asks that we hold off on this transport chair order and let her call the insurance company to see what or if they will pay for the scooter.  She will return the call with info.

## 2018-09-14 NOTE — Telephone Encounter (Signed)
  Patient calling the office for samples of medication:   1.  What medication and dosage are you requesting samples for? ELIQUIS 5 MG TABS tablet  2.  Are you currently out of this medication?   Patient is completely out of medication. They are waiting on prior auth to get refill.

## 2018-09-14 NOTE — Telephone Encounter (Signed)
Spoke with Gregory Wiley, pt wife, and informed her that no samples of Eliquis 5 mg available in the office. Also informed her that it appears that pt prior auth request sent to refill pool so nurse will route message to pharmD. Pt wife verbalized understanding.

## 2018-09-14 NOTE — Telephone Encounter (Signed)
Conversation: Medication Refill (Newest Message First)     09/14/18 10:49 AM Lysbeth Galas S routed this conversation to [facility]  Gwendel Hanson     09/14/18 10:49 AM Note     *STAT* If patient is at the pharmacy, call can be transferred to refill team.   1. Which medications need to be refilled? (please list name of each medication and dose if known) ELIQUIS 5 MG TABS tablet  2. Which pharmacy/location (including street and city if local pharmacy) is medication to be sent to? [facility]  3. Do they need a 30 day or 90 day supply? 90  Patient is calling to check on the pre authorization for this drug also. Patient is completely out of medication.

## 2018-09-14 NOTE — Telephone Encounter (Signed)
Confirmed with pharmacy that requestion was needs PA. PA submitted. But response was N/A see previous message. Called pharmacy and had them process it again. Now processed without error. Patient's wife made aware

## 2018-09-14 NOTE — Telephone Encounter (Signed)
 *  STAT* If patient is at the pharmacy, call can be transferred to refill team.   1. Which medications need to be refilled? (please list name of each medication and dose if known) ELIQUIS 5 MG TABS tablet  2. Which pharmacy/location (including street and city if local pharmacy) is medication to be sent to? [facility]  3. Do they need a 30 day or 90 day supply? 90  Patient is calling to check on the pre authorization for this drug also. Patient is completely out of medication.

## 2018-09-14 NOTE — Assessment & Plan Note (Signed)
He still gets frustrated with his situation, from the previous stroke.  He prev inc'd SSRI to 75mg  sertraline w/o much effect.  He prev did better on higher dosage compared to now.  No suicidal homicidal intent.  Increase to 100 mg a day.  Plan on recheck in 3 months but I want patient/wife to update me about his mood in about 1 month.

## 2018-09-15 ENCOUNTER — Telehealth: Payer: Self-pay

## 2018-09-15 NOTE — Telephone Encounter (Signed)
Received fax information from [facility] that patients Eliquis 5 mg tablets has been approved 08/15/2018- 09/14/2019. Will scan approval into chart.

## 2018-09-21 ENCOUNTER — Other Ambulatory Visit: Payer: Self-pay

## 2018-09-21 ENCOUNTER — Encounter: Payer: Self-pay | Admitting: Podiatry

## 2018-09-21 ENCOUNTER — Ambulatory Visit (INDEPENDENT_AMBULATORY_CARE_PROVIDER_SITE_OTHER): Payer: Medicare Other | Admitting: Podiatry

## 2018-09-21 VITALS — Temp 98.1°F

## 2018-09-21 DIAGNOSIS — B351 Tinea unguium: Secondary | ICD-10-CM | POA: Diagnosis not present

## 2018-09-21 DIAGNOSIS — Z8673 Personal history of transient ischemic attack (TIA), and cerebral infarction without residual deficits: Secondary | ICD-10-CM

## 2018-09-21 DIAGNOSIS — M79676 Pain in unspecified toe(s): Secondary | ICD-10-CM | POA: Diagnosis not present

## 2018-09-21 MED ORDER — EFINACONAZOLE 10 % EX SOLN: 1.0000 "application " | Freq: Every day | CUTANEOUS | 2 refills | Status: DC

## 2018-09-23 NOTE — Progress Notes (Signed)
   SUBJECTIVE Patient presents to office today complaining of elongated, thickened nails that cause pain while ambulating in shoes. He is unable to trim his own nails. Patient is here for further evaluation and treatment.  Past Medical History:  Diagnosis Date  . Adrenal insufficiency ([place])   . Anxiety   . Basal cell carcinoma of skin of left ankle   . Bleeding stomach ulcer 2015  . Coronary artery disease    stent, then CABG 07/20/10  . Daily headache    "for the past month" (11/11/2014)  . Depression   . Diverticulosis   . GERD (gastroesophageal reflux disease)   . Gout   . H/O cardiomyopathy    ischemic, now last echo 07/30/11, EF 38%W  . History of hiatal hernia   . Hyperlipidemia   . Hypertension   . Internal hemorrhoids   . Left hemiplegia ([place])   . NSTEMI (non-ST elevated myocardial infarction) ([place])    NSTEMI- last cath 06/2011-Stent to LCX-DES, last nuc 07/30/11 low risk  . Obesity (BMI 30-39.9)   . OSA on CPAP    since July 2013- uses CPAP sometimes , states he has lost 147 lbs. since stroke & doesn't use the CPAP as much as he use to.   . Plantar fasciitis of left foot   . Pneumonia    hosp. 12/2014  . Pulmonary embolism ([place]) 03/2014  . Stroke ([place]) 01/2014   "no control of LUE, can slightly LLE; memory problems since" (11/11/2014)  . Tubular adenoma of colon     OBJECTIVE General Patient is awake, alert, and oriented x 3 and in no acute distress. Derm Skin is dry and supple bilateral. Negative open lesions or macerations. Remaining integument unremarkable. Nails are tender, long, thickened and dystrophic with subungual debris, consistent with onychomycosis, 1-5 bilateral. No signs of infection noted. Vasc  DP and PT pedal pulses palpable bilaterally. Temperature gradient within normal limits.  Neuro Epicritic and protective threshold sensation grossly intact bilaterally.  Musculoskeletal Exam No symptomatic pedal deformities noted bilateral. Muscular strength within  normal limits.  ASSESSMENT 1. Onychodystrophic nails 1-5 bilateral with hyperkeratosis of nails.  2. Onychomycosis of nail due to dermatophyte bilateral 3. Pain in foot bilateral 4. H/o hemiparesis left   PLAN OF CARE 1. Patient evaluated today.  2. Instructed to maintain good pedal hygiene and foot care.  3. Mechanical debridement of nails 1-5 bilaterally performed using a nail nipper. Filed with dremel without incident.  4. Prescription for [name] provided to patient to apply daily.  5. Return to clinic in 3 mos.    Edrick Kins, DPM [facility]  Dr. Edrick Kins, [address]                                        [place], [place] [address]                Office [phone]  Fax [phone]

## 2018-09-24 ENCOUNTER — Telehealth: Payer: Self-pay | Admitting: Podiatry

## 2018-09-24 NOTE — Telephone Encounter (Signed)
Pt was seen in office on Monday and was prescribed a topical cream. When the patient called the pharmacy they stated they are waiting on prior authorization from insurance.

## 2018-10-08 ENCOUNTER — Encounter: Payer: Self-pay | Admitting: Family Medicine

## 2018-10-08 ENCOUNTER — Ambulatory Visit (INDEPENDENT_AMBULATORY_CARE_PROVIDER_SITE_OTHER): Payer: Medicare Other | Admitting: Family Medicine

## 2018-10-08 ENCOUNTER — Other Ambulatory Visit: Payer: Self-pay

## 2018-10-08 ENCOUNTER — Telehealth: Payer: Self-pay | Admitting: Podiatry

## 2018-10-08 VITALS — BP 115/74 | HR 62 | Temp 98.1°F | Ht 72.0 in

## 2018-10-08 DIAGNOSIS — F32A Depression, unspecified: Secondary | ICD-10-CM

## 2018-10-08 DIAGNOSIS — I69952 Hemiplegia and hemiparesis following unspecified cerebrovascular disease affecting left dominant side: Secondary | ICD-10-CM

## 2018-10-08 DIAGNOSIS — G4733 Obstructive sleep apnea (adult) (pediatric): Secondary | ICD-10-CM

## 2018-10-08 DIAGNOSIS — R202 Paresthesia of skin: Secondary | ICD-10-CM

## 2018-10-08 DIAGNOSIS — F329 Major depressive disorder, single episode, unspecified: Secondary | ICD-10-CM

## 2018-10-08 MED ORDER — GABAPENTIN 300 MG PO CAPS: 300.0000 mg | ORAL_CAPSULE | Freq: Every day | ORAL | 1 refills | Status: DC

## 2018-10-08 MED ORDER — HYDROCODONE-ACETAMINOPHEN 5-325 MG PO TABS: 1.0000 | ORAL_TABLET | Freq: Every day | ORAL | 0 refills | Status: DC | PRN

## 2018-10-08 NOTE — Patient Instructions (Addendum)
Continue current treatment plan  Close follow up with Dr Damita Dunnings for disease management and depression, we will place referral today to psychiatry  Close follow up with Dr Claiborne Billings for cardiovascular disease and OSA  Follow up with Dr Felecia Shelling in 6 months   Stroke Prevention Some medical conditions and lifestyle choices can lead to a higher risk for a stroke. You can help to prevent a stroke by making nutrition, lifestyle, and other changes. What nutrition changes can be made?   Eat healthy foods. ? Choose foods that are high in fiber. These include:  Fresh fruits.  Fresh vegetables.  Whole grains. ? Eat at least 5 or more servings of fruits and vegetables each day. Try to fill half of your plate at each meal with fruits and vegetables. ? Choose lean protein foods. These include:  Lowfat (lean) cuts of meat.  Chicken without skin.  Fish.  Tofu.  Beans.  Nuts. ? Eat low-fat dairy products. ? Avoid foods that:  Are high in salt (sodium).  Have saturated fat.  Have trans fat.  Have cholesterol.  Are processed.  Are premade.  Follow eating guidelines as told by your doctor. These may include: ? Reducing how many calories you eat and drink each day. ? Limiting how much salt you eat or drink each day to 1,500 milligrams (mg). ? Using only healthy fats for cooking. These include:  Olive oil.  Canola oil.  Sunflower oil. ? Counting how many carbohydrates you eat and drink each day. What lifestyle changes can be made?  Try to stay at a healthy weight. Talk to your doctor about what a good weight is for you.  Get at least 30 minutes of moderate physical activity at least 5 days a week. This can include: ? Fast walking. ? Biking. ? Swimming.  Do not use any products that have nicotine or tobacco. This includes cigarettes and e-cigarettes. If you need help quitting, ask your doctor. Avoid being around tobacco smoke in general.  Limit how much alcohol you drink  to no more than 1 drink a day for nonpregnant women and 2 drinks a day for men. One drink equals 12 oz of beer, 5 oz of wine, or 1 oz of hard liquor.  Do not use drugs.  Avoid taking birth control pills. Talk to your doctor about the risks of taking birth control pills if: ? You are over 27 years old. ? You smoke. ? You get migraines. ? You have had a blood clot. What other changes can be made?  Manage your cholesterol. ? It is important to eat a healthy diet. ? If your cholesterol cannot be managed through your diet, you may also need to take medicines. Take medicines as told by your doctor.  Manage your diabetes. ? It is important to eat a healthy diet and to exercise regularly. ? If your blood sugar cannot be managed through diet and exercise, you may need to take medicines. Take medicines as told by your doctor.  Control your high blood pressure (hypertension). ? Try to keep your blood pressure below 130/80. This can help lower your risk of stroke. ? It is important to eat a healthy diet and to exercise regularly. ? If your blood pressure cannot be managed through diet and exercise, you may need to take medicines. Take medicines as told by your doctor. ? Ask your doctor if you should check your blood pressure at home. ? Have your blood pressure checked every year. Do this  even if your blood pressure is normal.  Talk to your doctor about getting checked for a sleep disorder. Signs of this can include: ? Snoring a lot. ? Feeling very tired.  Take over-the-counter and prescription medicines only as told by your doctor. These may include aspirin or blood thinners (antiplatelets or anticoagulants).  Make sure that any other medical conditions you have are managed. Where to find more information  American Stroke Association: www.strokeassociation.org  National Stroke Association: www.stroke.org Get help right away if:  You have any symptoms of stroke. "BE FAST" is an easy way  to remember the main warning signs: ? B - Balance. Signs are dizziness, sudden trouble walking, or loss of balance. ? E - Eyes. Signs are trouble seeing or a sudden change in how you see. ? F - Face. Signs are sudden weakness or loss of feeling of the face, or the face or eyelid drooping on one side. ? A - Arms. Signs are weakness or loss of feeling in an arm. This happens suddenly and usually on one side of the body. ? S - Speech. Signs are sudden trouble speaking, slurred speech, or trouble understanding what people say. ? T - Time. Time to call emergency services. Write down what time symptoms started.  You have other signs of stroke, such as: ? A sudden, very bad headache with no known cause. ? Feeling sick to your stomach (nausea). ? Throwing up (vomiting). ? Jerky movements you cannot control (seizure). These symptoms may represent a serious problem that is an emergency. Do not wait to see if the symptoms will go away. Get medical help right away. Call your local emergency services (911 in the [place] Do not drive yourself to the hospital. Summary  You can prevent a stroke by eating healthy, exercising, not smoking, drinking less alcohol, and treating other health problems, such as diabetes, high blood pressure, or high cholesterol.  Do not use any products that contain nicotine or tobacco, such as cigarettes and e-cigarettes.  Get help right away if you have any signs or symptoms of a stroke. This information is not intended to replace advice given to you by your health care provider. Make sure you discuss any questions you have with your health care provider. Document Released: 09/03/2011 Document Revised: 04/30/2018 Document Reviewed: 06/05/2016 Elsevier Patient Education  2020 [employer].

## 2018-10-08 NOTE — Progress Notes (Signed)
PATIENT: Gregory Wiley DOB: November 07, 1950  REASON FOR VISIT: follow up HISTORY FROM: patient  Chief Complaint  Patient presents with  . Follow-up    Rm 1,  wife.   . L hemiplegia    cpap is per Dr. Claiborne Billings Cardiology     HISTORY OF PRESENT ILLNESS: Today 10/08/18 Gregory Wiley is a 68 y.o. male here today for follow up for left sided hemplegia s/p CVA in 2015. He continues to feel "off." he feels that he is drunk at times. He denies dizziness but feels unfocused. No pain/headaches. He feels ok when he wakes up but the through out the day he feels worse and worse. If he rests he feels better. He feels that he is starved for oxygen or at high altitudes. Oxygen levels are always normal. He was started on Nuvigil 200mg  daily at last visit for fatigue and sleepiness. He does not feel this has helped at all. His wife does feel that it has helped with his memory.   He denies falls at home. He uses cane for short distance walking and wheelchair for longer distances.   He is working with Dr Damita Dunnings for increased depression. He is on sertraline that was recently increased to 100mg  daily. He is tolerating well. He is unsure if this has helped. He continues to feel short tempered. He was advised to follow up with Dr Damita Dunnings in 1 month following increase. He was advised to consider Wellbutrin from a stroke support member. He will call to schedule follow up.   He is seen by Dr Claiborne Billings (cardiology) for sleep apnea, CHF, CAD s/p CABG in 2012. He is seen every 6 months. He continues Eliquis. He reports that apnea was well managed at last visit. He continues to be tired throughout the day.   No vision changes or changes in bowel or bladder. CBC's have been stable. He has not noticed blood in stool. He did not have a colonoscopy. His wife does not feel he can tolerate prep. He is working with Dr Damita Dunnings to consider alternative options.   He takes gabapentin 300mg  once every few weeks for paresthesias of the left  side. He also uses Vicodin as needed for generalized pain. He can not remember when he last refilled this medication but rarely uses it. Last refill sent 11/2017 for 30 tablets.    HISTORY: (copied from Dr Garth Bigness note on 04/09/2018)  Gregory Wiley is a 68 year old man s/p CVA 02/12/14 leading to left hemiplegia.   He feels he is stable for the most part.    Update 1.23/2020: He denies any new symptoms since the last visit.   He felt funny last week feeling unfocused and was concerned he might have a stroke but everything was back to baseline after resting.   His wife felt he looked pale.  He notes the left pinky was twitching for a few minutes but no other symptoms.    He is on Eliquis and notes easy bruising.   He has had a slow drop in the hemoglobin and his doctors may refer for coloscopy.    He can walk 60-100 feet with a cene and has no recent falls.    His mouth is dry.   He feels his bladder is ok.     He has rare fecal incontinence if he takes senokot for constipation.     He is very apathetic and notes some depression.   He is on sertraline.Marland Kitchen    He  has a short temper.   He has OSA and is on CPAP.    He has not been on Nuvigil.   He was on Provigil shortly after the stroke but it had not helped much.      Update 08/06/2017: He denies new stroke like symptoms but notes swallowing is harder.    He needs to wash everything down or it will get stuck.    His mouth is often dry.     He is on Eliquis and notes he bruises easily, especially when a puppy jumps on him.     From the stroke, he has left sided weakness and spasticity   Tizanidine is helping the spasticity.  He is walking a little bit better and now can go about 80 feet using a cane before stopping.       Mood is much better and we discussed stopping the zoloft  He uses CPAP nightly and has a new machine.     Download shows great AHI at 2.5 and 100% compliance and average usage of 7 hours.   He notes some mental fogginess at times  but this is no worse.   He fatigues easily.      He notes some neck pain but is doing better with a new pillow.  He does not need a TPI.        Update 03/06/2017:    He denies any new neurologic symptoms.    He has spastic left hemiplegia and left foot drop.  He uses his cane around the house.  He can go 30 feet without too much of a problem A brace is heavy and he would rather walk without it.    He had one fall last month.   His foot is not flat when he stands up on the floor.   His neck is hurting more.  Pain stays in his neck with no radiation into the arm.    He feels left spasticity is about the same.    At times, the spasticity can be painful in the leg.  Cognition is stable. His left sided neglect improved over time.   He is on Eliquis as anticoagulation.  He has OSA and has a new machine and mask.   He showed me an app and he had 3.3 - 8.1 AHI over the past week.         He notes neck pain, right greater than left. He does not want trigger point injection. In the past, TPI's have helped but he finds them to be painful.  From 09/16/2016: Stroke:   He denies any new stroke symptoms. He remains on Elavil risk for stroke prophylaxis. He has not had any transient ischemic attacks or other transient neurologic symptoms. He continues to have left hemiplegia and he gets slurred speech when he is tired.   Stroke History:  On 02/12/2014, he woke up with weakness on the left side and was unable to stand up. He went to the emergency room.Marland Kitchen MRI of the brain showed an acute right MCA territory stroke that mostly involved the right temporal lobe with extension to the insula and the basal ganglia on the right. MR angiogram showed an occlusion of the right M1 segment  Of note, he had been on aspirin and Plavix that had been discontinued a few weeks earlier due to a bleeding ulcer.   Ultrasound of the carotid arteries showed less than 39% stenosis. A transesophageal echocardiogram showed a LVEF equals  45%,  there was septal hypokinesia, no PFO or ASD was identified.  Cardioembolism was suspected.  He was initially placed on Plavix but that was subsequently changed to Xarelto after he was found to have pulmonary embolisms while at rehabilitation center.  Gait/Strength/sensation:  Due to left hemiplegia, his gait is poor. He has had a few falls. With a quad cane he can walk about 30-50 feet if he is not tired and about 20-30 feet if he is tired. His bathroom is 30 feet away.  In the past, he was having orthostatic hypotension and had some episodes of syncope.    His leg also has left sided spasticity.   He also has numbness on the left side with some pain. He had some neglect and numbness initially that improved.   He has been on baclofen in the past but felt no benefit and felt very sleepy.    Orthostatic hypotension:     He is on Florinef and midodrine.  He gets pre-syncope but no recent syncope.     Neck pain/Headache:  He continues to report a neck pain, better than last year but still daily.   In the past, occiptial nerve blocks helped to reduce the severe headache and neck pain.    Bladder:  He has urinary frequency and urgency this year.   He has not had incontinence.   Flomax was started but he flet dizzy and stopped.    OSA/sleep/sleepiness:    In the past, he had obstructive sleep apnea but stopped CPAP as he did not like the mask. However, he has lost > 150 pounds since the stroke.   He still snores but wife has not noted OSA.      He has excessive daytime sleepiness.  Methylphenidate 20 mg never really helped.     In the past, EDS was better when he wore CPAP but he can't wear he whole night.      Insomnia:   He has sleep maintenance insomnia > sleep onet (usually quick).    He takes melatonin at bedtime.   Cognitive:   He and his wife note some decreased attention and decrease executive function abilities.  He had a neglect after the stroke which appears to improve.  Mood:    He  continues to feel depressed. He thinks this certainly may have helped slightly but not much. He gets irritable very easily. He denies anxiety.  REVIEW OF SYSTEMS: Out of a complete 14 system review of symptoms, the patient complains only of the following symptoms, depression, muscle aches, irritability, easy bruising and all other reviewed systems are negative.  ALLERGIES: Allergies  Allergen Reactions  . Diphenhydramine Hcl Anaphylaxis  . Adhesive [Tape] Other (See Comments)    Tears skin - please use paper tape  . Proscar [Finasteride] Other (See Comments)    Lightheaded.   . Tamsulosin Other (See Comments)    Dizzy, vomiting.     HOME MEDICATIONS: Outpatient Medications Prior to Visit  Medication Sig Dispense Refill  . acetaminophen (TYLENOL) 325 MG tablet Take 1 tablet (325 mg total) by mouth every 6 (six) hours as needed for mild pain.    Marland Kitchen albuterol (PROVENTIL HFA;VENTOLIN HFA) 108 (90 Base) MCG/ACT inhaler Inhale 2 puffs into the lungs every 6 (six) hours as needed for wheezing or shortness of breath. 1 Inhaler 0  . Armodafinil (NUVIGIL) 200 MG TABS Take 1 po qAM 30 tablet 5  . cholecalciferol (VITAMIN D) 1000 units tablet Take 1,000 Units by mouth daily.    Marland Kitchen  doxepin (SINEQUAN) 10 MG capsule TAKE 1 CAPSULE (10 MG TOTAL) BY MOUTH AT BEDTIME. 30 capsule 11  . ELDERBERRY PO Take by mouth. 3 a day    . ELIQUIS 5 MG TABS tablet TAKE 1 TABLET BY MOUTH 2 TIMES DAILY. 180 tablet 1  . FEROSUL 325 (65 Fe) MG tablet TAKE 1 TABLET BY MOUTH 2 TIMES DAILY WITH A MEAL. 60 tablet 5  . fluticasone (FLONASE) 50 MCG/ACT nasal spray Place 2 sprays into both nostrils daily. (Patient taking differently: Place 2 sprays into both nostrils daily as needed. )    . hydrocortisone (ANUSOL-HC) 25 MG suppository Place 1 suppository (25 mg total) rectally 2 (two) times daily as needed. X 1 week; then, use as needed thereafter. 24 suppository 3  . Melatonin 10 MG CAPS TAKE 1 CAPSULE BY MOUTH AT BEDTIME AS  NEEDED FOR SLEEP 90 capsule 3  . midodrine (PROAMATINE) 2.5 MG tablet Take 1 tablet (2.5 mg total) by mouth daily. 30 tablet 5  . nitroGLYCERIN (NITROSTAT) 0.4 MG SL tablet Place 1 tablet (0.4 mg total) under the tongue every 5 (five) minutes as needed for chest pain.    . NONFORMULARY OR COMPOUNDED ITEM Supplement menmoringa oleifera taken daily.    Marland Kitchen nystatin (MYCOSTATIN/NYSTOP) powder Apply topically 2 (two) times daily. 60 g 3  . OVER THE COUNTER MEDICATION Ceylon Cinnamon- takes 2 a day, 1200 mg    . pantoprazole (PROTONIX) 40 MG tablet TAKE 1 TABLET BY MOUTH 2 TIMES DAILY. 60 tablet 6  . potassium chloride (K-DUR) 10 MEQ tablet TAKE 1 TABLETS BY MOUTH 2 TIMES DAILY. 360 tablet 3  . PRESCRIPTION MEDICATION Inhale into the lungs at bedtime. CPAP    . prochlorperazine (COMPAZINE) 10 MG tablet Take 1 tablet (10 mg total) by mouth every 8 (eight) hours as needed (headache, nausea or vomiting). 10 tablet 0  . QC LO-DOSE ASPIRIN 81 MG EC tablet TAKE 1 TABLET (81 MG TOTAL) BY MOUTH DAILY. SWALLOW WHOLE. 90 tablet 3  . QC STOOL SOFTENER PLS LAXATIVE 8.6-50 MG tablet TAKE 1 TABLET BY MOUTH 2 TIMES DAILY. 60 tablet 2  . rosuvastatin (CRESTOR) 40 MG tablet TAKE 1 TABLET BY MOUTH DAILY. 90 tablet 3  . senna (SENOKOT) 8.6 MG tablet Take 1 tablet by mouth 2 (two) times daily.    . sertraline (ZOLOFT) 100 MG tablet Take 1 tablet (100 mg total) by mouth daily. 90 tablet 3  . tiZANidine (ZANAFLEX) 4 MG capsule Take 1 capsule (4 mg total) by mouth as needed. 90 capsule 3  . gabapentin (NEURONTIN) 300 MG capsule Take 1 capsule (300 mg total) by mouth at bedtime. 90 capsule 1  . HYDROcodone-acetaminophen (NORCO/VICODIN) 5-325 MG tablet Take 1 tablet by mouth daily as needed for moderate pain. 30 tablet 0  . Efinaconazole 10 % SOLN Apply 1 application topically daily. (Patient not taking: Reported on 10/08/2018) 8 mL 2   No facility-administered medications prior to visit.     PAST MEDICAL HISTORY: Past  Medical History:  Diagnosis Date  . Adrenal insufficiency ([place])   . Anxiety   . Basal cell carcinoma of skin of left ankle   . Bleeding stomach ulcer 2015  . Coronary artery disease    stent, then CABG 07/20/10  . Daily headache    "for the past month" (11/11/2014)  . Depression   . Diverticulosis   . GERD (gastroesophageal reflux disease)   . Gout   . H/O cardiomyopathy    ischemic, now last  echo 07/30/11, EF 38%W  . History of hiatal hernia   . Hyperlipidemia   . Hypertension   . Internal hemorrhoids   . Left hemiplegia ([place])   . NSTEMI (non-ST elevated myocardial infarction) ([place])    NSTEMI- last cath 06/2011-Stent to LCX-DES, last nuc 07/30/11 low risk  . Obesity (BMI 30-39.9)   . OSA on CPAP    since July 2013- uses CPAP sometimes , states he has lost 147 lbs. since stroke & doesn't use the CPAP as much as he use to.   . Plantar fasciitis of left foot   . Pneumonia    hosp. 12/2014  . Pulmonary embolism ([place]) 03/2014  . Stroke ([place]) 01/2014   "no control of LUE, can slightly LLE; memory problems since" (11/11/2014)  . Tubular adenoma of colon     PAST SURGICAL HISTORY: Past Surgical History:  Procedure Laterality Date  . BASAL CELL CARCINOMA EXCISION Left 2015   ankle  . CARDIAC SURGERY    . CATARACT EXTRACTION Bilateral   . COLONOSCOPY N/A 02/10/2013   Procedure: COLONOSCOPY;  Surgeon: Ladene Artist, MD;  Location: [facility];  Service: Endoscopy;  Laterality: N/A;  . CORONARY ANGIOPLASTY WITH STENT PLACEMENT  07/01/2011   DES-Resolute to native LCX  . CORONARY ANGIOPLASTY WITH STENT PLACEMENT  12/01/2007   COMPLEX 5 LESION PCI INCLUDING CUTTING BALLOON AND 4 CYPHER DESs  . CORONARY ARTERY BYPASS GRAFT  07/20/2010    LIMA-LAD; VG-ACUTE MARG of RCA; Seq VG-distal RCA & then pda  . EP IMPLANTABLE DEVICE N/A 02/14/2016   Procedure: Loop Recorder Removal;  Surgeon: Thompson Grayer, MD;  Location: [facility];  Service: Cardiovascular;  Laterality: N/A;  .  ESOPHAGOGASTRODUODENOSCOPY  01/2014   gastric ulcer, erosive gastroduodenitis, H Pylori negative.   . ESOPHAGOGASTRODUODENOSCOPY (EGD) WITH PROPOFOL N/A 11/14/2014   Procedure: ESOPHAGOGASTRODUODENOSCOPY (EGD) WITH PROPOFOL;  Surgeon: Milus Banister, MD;  Location: [facility];  Service: Endoscopy;  Laterality: N/A;  . HAND SURGERY Right    to take glass out  . HERNIA REPAIR    . INTRAMEDULLARY (IM) NAIL INTERTROCHANTERIC Right 02/07/2015   Procedure: INTRAMEDULLARY (IM) NAIL INTERTROCHANTRIC RIGHT HIP;  Surgeon: Renette Butters, MD;  Location: [facility];  Service: Orthopedics;  Laterality: Right;  . KNEE ARTHROSCOPY Right   . LEFT HEART CATHETERIZATION WITH CORONARY/GRAFT ANGIOGRAM  07/01/2011   Procedure: LEFT HEART CATHETERIZATION WITH Beatrix Fetters;  Surgeon: Sanda Klein, MD;  Location: [facility];  Service: Cardiovascular;;  . LOOP RECORDER IMPLANT  02/15/2014   MDT LINQ implanted by Dr Rayann Heman for cryptogenic stroke  . PERCUTANEOUS CORONARY STENT INTERVENTION (PCI-S) Right 07/01/2011   Procedure: PERCUTANEOUS CORONARY STENT INTERVENTION (PCI-S);  Surgeon: Sanda Klein, MD;  Location: [facility];  Service: Cardiovascular;  Laterality: Right;  . PICC LINE PLACE PERIPHERAL ([place] HX)  06/2015  . RETINAL DETACHMENT SURGERY Right   . TEE WITHOUT CARDIOVERSION N/A 02/15/2014   Procedure: TRANSESOPHAGEAL ECHOCARDIOGRAM (TEE);  Surgeon: Josue Hector, MD;  Location: [place];  Service: Cardiovascular;  Laterality: N/A;  . UMBILICAL HERNIA REPAIR  2006    FAMILY HISTORY: Family History  Problem Relation Age of Onset  . Diabetes type II Mother   . CAD Father 36       CABG, PCI, died at 53  . Heart attack Father 20  . Hypertension Brother   . Diabetes Brother   . Hyperlipidemia Brother   . Brain cancer Maternal Grandmother   . COPD Paternal Grandfather   .  Coronary artery disease Other   . Other Other        denies family history of DVT/PE  . Colon cancer Neg Hx   .  Stroke Neg Hx   . Prostate cancer Neg Hx     SOCIAL HISTORY: Social History   Socioeconomic History  . Marital status: Married    Spouse name: Not on file  . Number of children: 5  . Years of education: Not on file  . Highest education level: Not on file  Occupational History    Employer: [employer]    Comment: works at the [employer]  . Financial resource strain: Not on file  . Food insecurity    Worry: Not on file    Inability: Not on file  . Transportation needs    Medical: Not on file    Non-medical: Not on file  Tobacco Use  . Smoking status: Former Smoker    Packs/day: 2.00    Years: 4.00    Pack years: 8.00    Types: Cigarettes    Quit date: 01/30/1969    Years since quitting: 49.7  . Smokeless tobacco: Never Used  . Tobacco comment: "quit smoking cigarettes  in the 1970's"  Substance and Sexual Activity  . Alcohol use: No    Alcohol/week: 0.0 standard drinks  . Drug use: No    Comment: "smoked some pot in college"  . Sexual activity: Not Currently  Lifestyle  . Physical activity    Days per week: Not on file    Minutes per session: Not on file  . Stress: Not on file  Relationships  . Social Herbalist on phone: Not on file    Gets together: Not on file    Attends religious service: Not on file    Active member of club or organization: Not on file    Attends meetings of clubs or organizations: Not on file    Relationship status: Not on file  . Intimate partner violence    Fear of current or ex partner: Not on file    Emotionally abused: Not on file    Physically abused: Not on file    Forced sexual activity: Not on file  Other Topics Concern  . Not on file  Social History Narrative   Quit smoking 40 years ago   Married 1996   Likes to play guitar and sing with church group unable since 02/12/14 stroke    [employer] 1970-1972, [place], no service related issues.     Left handed but now right handed after stroke       PHYSICAL EXAM  Vitals:   10/08/18 1306  BP: 115/74  Pulse: 62  Temp: 98.1 F (36.7 C)  Height: 6' (1.829 m)   Body mass index is 34.91 kg/m.  Generalized: Well developed, in no acute distress  Neurological examination  Mentation: Alert oriented to time, place, history taking. Follows all commands speech and language fluent Cranial nerve II-XII: Pupils were equal round reactive to light. Extraocular movements were full Gait and station: in wheelchair today    DIAGNOSTIC DATA (LABS, IMAGING, TESTING) - I reviewed patient records, labs, notes, testing and imaging myself where available.  No flowsheet data found.   Lab Results  Component Value Date   WBC 6.1 09/11/2018   HGB 14.7 09/11/2018   HCT 44.1 09/11/2018   MCV 87.7 09/11/2018   PLT 148 09/11/2018      Component Value Date/Time  NA 141 09/11/2018 1501   NA 143 06/02/2018 0000   K 4.3 09/11/2018 1501   CL 106 09/11/2018 1501   CO2 29 09/11/2018 1501   GLUCOSE 99 09/11/2018 1501   BUN 15 09/11/2018 1501   BUN 17 06/02/2018 0000   CREATININE 1.14 09/11/2018 1501   CALCIUM 8.9 09/11/2018 1501   PROT 6.6 06/02/2018 0000   ALBUMIN 3.9 06/02/2018 0000   AST 21 06/02/2018 0000   ALT 21 06/02/2018 0000   ALKPHOS 67 06/02/2018 0000   BILITOT 0.3 06/02/2018 0000   GFRNONAA 77 06/02/2018 0000   GFRNONAA >89 09/13/2014 1032   GFRAA 89 06/02/2018 0000   GFRAA >89 09/13/2014 1032   Lab Results  Component Value Date   CHOL 134 06/02/2018   HDL 57 06/02/2018   LDLCALC 65 06/02/2018   TRIG 59 06/02/2018   CHOLHDL 2.4 06/02/2018   Lab Results  Component Value Date   HGBA1C 5.6 02/13/2014   Lab Results  Component Value Date   VITAMINB12 286 07/05/2015   Lab Results  Component Value Date   TSH 1.640 06/02/2018    ASSESSMENT AND PLAN 68 y.o. year old male  has a past medical history of Adrenal insufficiency ([place]), Anxiety, Basal cell carcinoma of skin of left ankle, Bleeding stomach ulcer (2015), Coronary  artery disease, Daily headache, Depression, Diverticulosis, GERD (gastroesophageal reflux disease), Gout, H/O cardiomyopathy, History of hiatal hernia, Hyperlipidemia, Hypertension, Internal hemorrhoids, Left hemiplegia ([place]), NSTEMI (non-ST elevated myocardial infarction) ([place]), Obesity (BMI 30-39.9), OSA on CPAP, Plantar fasciitis of left foot, Pneumonia, Pulmonary embolism ([place]) (03/2014), Stroke ([facility]) (01/2014), and Tubular adenoma of colon. here with     ICD-10-CM   1. Hemiplegia affecting left side in left-dominant patient as late effect of cerebrovascular disease (HCC)  V78.469 HYDROcodone-acetaminophen (NORCO/VICODIN) 5-325 MG tablet    gabapentin (NEURONTIN) 300 MG capsule  2. Depression, unspecified depression type  F32.9 Ambulatory referral to Psychiatry  3. OSA (obstructive sleep apnea)  G47.33   4. Paresthesia  R20.2 gabapentin (NEURONTIN) 300 MG capsule    Unfortunately, Hyman does not feel much improved from his last visit.  He reports that since his stroke 4 years ago he is continued to feel out of it.  He feels more depressed.  He does express an interest in psychiatry referral today.  This has been placed per his request.  I have also encouraged him to continue working closely with Dr. Damita Dunnings for management with sertraline and consideration of addition of Wellbutrin if appropriate.  We will continue Nuvigil for now as it does appear to help with memory.  He will continue tizanidine for muscle spasms.  I will refill gabapentin for paresthesias and Vicodin for generalized pain.  He was advised to use only as needed.  Stroke prevention education provided.  He was encouraged to continue close follow-up with Dr. Damita Dunnings and Dr. Claiborne Billings.  He will follow-up with Dr. Felecia Shelling in 3 months.   Orders Placed This Encounter  Procedures  . Ambulatory referral to Psychiatry    Referral Priority:   Routine    Referral Type:   Psychiatric    Referral Reason:   Specialty Services Required    Requested  Specialty:   Psychiatry    Number of Visits Requested:   1     Meds ordered this encounter  Medications  . HYDROcodone-acetaminophen (NORCO/VICODIN) 5-325 MG tablet    Sig: Take 1 tablet by mouth daily as needed for moderate pain.    Dispense:  30  tablet    Refill:  0    Order Specific Question:   Supervising Provider    Answer:   Melvenia Beam [id number]  . gabapentin (NEURONTIN) 300 MG capsule    Sig: Take 1 capsule (300 mg total) by mouth at bedtime.    Dispense:  30 capsule    Refill:  1    Order Specific Question:   Supervising Provider    Answer:   Melvenia Beam [[id number]]        UUV OZDGU, FNP-C 10/08/2018, 2:28 PM [facility] [address], [place], [place] [address] [phone]

## 2018-10-08 NOTE — Addendum Note (Signed)
Addended by: Debbora Presto L on: 10/08/2018 04:24 PM   Modules accepted: Orders

## 2018-10-08 NOTE — Progress Notes (Signed)
I have read the note, and I agree with the clinical assessment and plan.  Shauntee Karp A. Leitha Hyppolite, MD, PhD, FAAN Certified in Neurology, Clinical Neurophysiology, Sleep Medicine, Pain Medicine and Neuroimaging  [facility] [address], [address] [place], [place] [address] [phone]  

## 2018-10-08 NOTE — Telephone Encounter (Signed)
Anti fungal medications needs prior authorization through patients insurance. Please call when possible.

## 2018-10-13 ENCOUNTER — Other Ambulatory Visit: Payer: Self-pay | Admitting: Neurology

## 2018-11-04 ENCOUNTER — Other Ambulatory Visit: Payer: Self-pay

## 2018-11-04 ENCOUNTER — Telehealth: Payer: Self-pay

## 2018-11-04 MED ORDER — CICLOPIROX 8 % EX SOLN: Freq: Every day | CUTANEOUS | 0 refills | Status: DC

## 2018-11-04 NOTE — Progress Notes (Addendum)
Rx Ciclopirox alternative for [name] sent to pharmacy [facility] in [place]

## 2018-11-04 NOTE — Telephone Encounter (Signed)
Called patient left VM informing Medication, [name] was not covered by insurance and that, Ciclopirox alternative for [name] sent to [place]

## 2018-11-04 NOTE — Addendum Note (Signed)
Addended by: Thereasa Distance on: 11/04/2018 11:47 AM   Modules accepted: Orders

## 2018-12-08 ENCOUNTER — Other Ambulatory Visit: Payer: Self-pay | Admitting: *Deleted

## 2018-12-08 MED ORDER — ROSUVASTATIN CALCIUM 40 MG PO TABS: 40.0000 mg | ORAL_TABLET | Freq: Every day | ORAL | 3 refills | Status: DC

## 2018-12-08 MED ORDER — MELATONIN 10 MG PO CAPS: ORAL_CAPSULE | ORAL | 3 refills | Status: DC

## 2018-12-08 MED ORDER — POTASSIUM CHLORIDE ER 10 MEQ PO TBCR: EXTENDED_RELEASE_TABLET | ORAL | 3 refills | Status: DC

## 2018-12-08 NOTE — Telephone Encounter (Signed)
Faxed refill request. Eliquis Last office visit:   09/11/2018 Last Filled:    180 tablet 1 06/11/2018  Please advise.

## 2018-12-09 ENCOUNTER — Other Ambulatory Visit: Payer: Self-pay

## 2018-12-09 MED ORDER — APIXABAN 5 MG PO TABS: 5.0000 mg | ORAL_TABLET | Freq: Two times a day (BID) | ORAL | 2 refills | Status: DC

## 2018-12-09 MED ORDER — APIXABAN 5 MG PO TABS: 5.0000 mg | ORAL_TABLET | Freq: Two times a day (BID) | ORAL | 1 refills | Status: DC

## 2018-12-09 NOTE — Telephone Encounter (Signed)
Sent. Thanks.   

## 2018-12-10 ENCOUNTER — Other Ambulatory Visit: Payer: Self-pay

## 2018-12-14 ENCOUNTER — Telehealth: Payer: Self-pay | Admitting: Family Medicine

## 2018-12-14 ENCOUNTER — Other Ambulatory Visit: Payer: Self-pay

## 2018-12-14 ENCOUNTER — Encounter: Payer: Self-pay | Admitting: Family Medicine

## 2018-12-14 ENCOUNTER — Ambulatory Visit (INDEPENDENT_AMBULATORY_CARE_PROVIDER_SITE_OTHER): Payer: Medicare Other | Admitting: Family Medicine

## 2018-12-14 VITALS — BP 110/70 | HR 90 | Temp 98.0°F | Ht 72.0 in | Wt 255.0 lb

## 2018-12-14 DIAGNOSIS — R319 Hematuria, unspecified: Secondary | ICD-10-CM | POA: Diagnosis not present

## 2018-12-14 DIAGNOSIS — D509 Iron deficiency anemia, unspecified: Secondary | ICD-10-CM

## 2018-12-14 DIAGNOSIS — I69952 Hemiplegia and hemiparesis following unspecified cerebrovascular disease affecting left dominant side: Secondary | ICD-10-CM

## 2018-12-14 DIAGNOSIS — Z23 Encounter for immunization: Secondary | ICD-10-CM

## 2018-12-14 LAB — CBC WITH DIFFERENTIAL/PLATELET
Basophils Absolute: 0 10*3/uL (ref 0.0–0.1)
Basophils Relative: 0.7 % (ref 0.0–3.0)
Eosinophils Absolute: 0.4 10*3/uL (ref 0.0–0.7)
Eosinophils Relative: 5.4 % — ABNORMAL HIGH (ref 0.0–5.0)
HCT: 45.5 % (ref 39.0–52.0)
Hemoglobin: 14.9 g/dL (ref 13.0–17.0)
Lymphocytes Relative: 26 % (ref 12.0–46.0)
Lymphs Abs: 1.8 10*3/uL (ref 0.7–4.0)
MCHC: 32.8 g/dL (ref 30.0–36.0)
MCV: 90 fl (ref 78.0–100.0)
Monocytes Absolute: 0.4 10*3/uL (ref 0.1–1.0)
Monocytes Relative: 6.2 % (ref 3.0–12.0)
Neutro Abs: 4.3 10*3/uL (ref 1.4–7.7)
Neutrophils Relative %: 61.7 % (ref 43.0–77.0)
Platelets: 157 10*3/uL (ref 150.0–400.0)
RBC: 5.06 Mil/uL (ref 4.22–5.81)
RDW: 13.8 % (ref 11.5–15.5)
WBC: 6.9 10*3/uL (ref 4.0–10.5)

## 2018-12-14 LAB — POC URINALSYSI DIPSTICK (AUTOMATED)
Bilirubin, UA: NEGATIVE
Glucose, UA: NEGATIVE
Ketones, UA: NEGATIVE
Leukocytes, UA: NEGATIVE
Nitrite, UA: NEGATIVE
Protein, UA: POSITIVE — AB
Spec Grav, UA: >=1.03 — AB (ref 1.010–1.025)
Urobilinogen, UA: 0.2 E.U./dL
pH, UA: 6 (ref 5.0–8.0)

## 2018-12-14 LAB — COMPREHENSIVE METABOLIC PANEL
ALT: 15 U/L (ref 0–53)
AST: 19 U/L (ref 0–37)
Albumin: 4 g/dL (ref 3.5–5.2)
Alkaline Phosphatase: 65 U/L (ref 39–117)
BUN: 15 mg/dL (ref 6–23)
CO2: 27 mEq/L (ref 19–32)
Calcium: 9.5 mg/dL (ref 8.4–10.5)
Chloride: 104 mEq/L (ref 96–112)
Creatinine, Ser: 1.13 mg/dL (ref 0.40–1.50)
GFR: 64.34 mL/min (ref >60.00)
Glucose, Bld: 95 mg/dL (ref 70–99)
Potassium: 4 mEq/L (ref 3.5–5.1)
Sodium: 141 mEq/L (ref 135–145)
Total Bilirubin: 0.5 mg/dL (ref 0.2–1.2)
Total Protein: 7.5 g/dL (ref 6.0–8.3)

## 2018-12-14 LAB — IRON: Iron: 72 ug/dL (ref 42–165)

## 2018-12-14 MED ORDER — ROSUVASTATIN CALCIUM 40 MG PO TABS: ORAL_TABLET | ORAL | Status: DC

## 2018-12-14 NOTE — Patient Instructions (Signed)
Stop crestor for 1 week.  See if you feel better with that.  Either way update me.  We'll contact you with your lab report. We'll go from there.  Take care.  Glad to see you.  Plan on a recheck in 3 months.

## 2018-12-14 NOTE — Progress Notes (Signed)
Due for f/u labs.  History of iron deficiency anemia.  We talked about his overall situation.  It may be safer for him to continue oral iron replacement along with anticoagulation and not go through with further evaluation.  He would need to make changes to his anticoagulation regimen to go through colonoscopy and he is not interested in that for multitude of reasons given his overall debility.  He agreed with continued iron replacement and avoiding further intervention assuming his blood counts were reasonable.  He still gets frustrated with his situation, from the previous stroke.  He prev inc'd SSRI to 100mg  sertraline w/o much effect.  "I'm still irritated.  I can't do the things I used to do three months ago."  He has more trouble weight bearing and getting to the bathroom.  No suicidal homicidal intent.  he still has lower BP noted, in spite of midodrine.  He is currently off Florinef. He didn't know if statin use was contributing to muscle changes.  We talked about getting home health PT set up again.  He had dark urine last week, better now.  No burning with urination.  No fevers.    His dog was bitten by a copperhead last night.    Meds, vitals, and allergies reviewed.   ROS: Per HPI unless specifically indicated in ROS section   GEN: nad, alert and oriented In wheelchair at baseline. HEENT: ncat NECK: supple w/o LA CV: rrr. PULM: ctab, no inc wob ABD: soft, +bs EXT: no edema SKIN: Well-perfused. Left hemiparesis at baseline.

## 2018-12-14 NOTE — Telephone Encounter (Signed)
Pt needs to change pharmacy per insurance please send rx to  [facility] [address]

## 2018-12-15 ENCOUNTER — Other Ambulatory Visit: Payer: Self-pay

## 2018-12-15 LAB — URINALYSIS, MICROSCOPIC ONLY

## 2018-12-15 MED ORDER — APIXABAN 5 MG PO TABS: 5.0000 mg | ORAL_TABLET | Freq: Two times a day (BID) | ORAL | 2 refills | Status: DC

## 2018-12-16 LAB — URINE CULTURE
MICRO NUMBER:: 928921
SPECIMEN QUALITY:: ADEQUATE

## 2018-12-16 NOTE — Assessment & Plan Note (Signed)
Multiple issues to consider.  He still gets frustrated with his situation, from the previous stroke.  He prev inc'd SSRI to 100mg  sertraline w/o much effect.  "I'm still irritated.  I can't do the things I used to do three months ago."  He has more trouble weight bearing and getting to the bathroom.  No suicidal homicidal intent.  he still has lower BP noted, in spite of midodrine.  He is currently off Florinef. He didn't know if statin use was contributing to muscle changes.    He will hold his statin for 1 week to see if it makes any difference in his situation. We talked about getting home health PT set up again. See notes on labs. If he does not notice a significant change with holding statin then we can consider trial of Florinef to try to increase his blood pressure or consider increasing his sertraline.  Discussed. >25 minutes spent in face to face time with patient, >50% spent in counselling or coordination of care.

## 2018-12-16 NOTE — Assessment & Plan Note (Signed)
Due for f/u labs.  History of iron deficiency anemia.  We talked about his overall situation.  It may be safer for him to continue oral iron replacement along with anticoagulation and not go through with further evaluation.  He would need to make changes to his anticoagulation regimen to go through colonoscopy and he is not interested in that for multitude of reasons given his overall debility.  He agreed with continued iron replacement and avoiding further intervention assuming his blood counts were reasonable.  See notes on labs.

## 2018-12-17 NOTE — Telephone Encounter (Signed)
Pharmacy is listed.  Thanks.

## 2018-12-21 ENCOUNTER — Telehealth: Payer: Self-pay | Admitting: *Deleted

## 2018-12-21 NOTE — Telephone Encounter (Signed)
Omira PT with [facility] left a voicemail stating that she needs a verbal okay to change the start of care for patient to 12/24/18. Meredith Leeds stated that the patient's wife is having symptoms and wants to make sure it is safe for her to come into the home. Okay to leave a message on her voicemail because it is secure.

## 2018-12-21 NOTE — Telephone Encounter (Signed)
Please give the order.  Thanks.   

## 2018-12-21 NOTE — Telephone Encounter (Signed)
Left detailed message on voicemail of Gregory Wiley.

## 2018-12-22 ENCOUNTER — Ambulatory Visit: Payer: Medicare Other | Admitting: Podiatry

## 2018-12-24 ENCOUNTER — Telehealth: Payer: Self-pay | Admitting: Family Medicine

## 2018-12-24 NOTE — Telephone Encounter (Signed)
Pts wife called in and stated the Internal Referral, Routine, [facility] [place] [place], Specialty Services Required was never received and needs to be resent with med rec

## 2018-12-28 ENCOUNTER — Telehealth: Payer: Self-pay | Admitting: *Deleted

## 2018-12-28 NOTE — Telephone Encounter (Signed)
Called and spoke with [name] from [facility] and I stated that I called Friday and was trying to get the PA for [name] and they sent me the patient's wife information instead of the patient and I had to get the PA redone and [name] is resending the paperwork for the PA and the reference number is [id number]. [name]

## 2018-12-29 NOTE — Telephone Encounter (Signed)
I Called [facility] and they already had the referral and they are going to call patient's wife to schedule.

## 2018-12-30 DIAGNOSIS — D509 Iron deficiency anemia, unspecified: Secondary | ICD-10-CM | POA: Diagnosis not present

## 2018-12-30 DIAGNOSIS — I69354 Hemiplegia and hemiparesis following cerebral infarction affecting left non-dominant side: Secondary | ICD-10-CM | POA: Diagnosis not present

## 2019-01-04 ENCOUNTER — Other Ambulatory Visit: Payer: Self-pay | Admitting: Family Medicine

## 2019-01-12 ENCOUNTER — Ambulatory Visit (INDEPENDENT_AMBULATORY_CARE_PROVIDER_SITE_OTHER): Payer: Medicare Other | Admitting: Neurology

## 2019-01-12 ENCOUNTER — Telehealth: Payer: Self-pay | Admitting: Neurology

## 2019-01-12 ENCOUNTER — Encounter: Payer: Self-pay | Admitting: Neurology

## 2019-01-12 ENCOUNTER — Other Ambulatory Visit: Payer: Self-pay

## 2019-01-12 VITALS — BP 100/58 | HR 77 | Temp 96.6°F | Ht 72.0 in | Wt 253.0 lb

## 2019-01-12 DIAGNOSIS — I69952 Hemiplegia and hemiparesis following unspecified cerebrovascular disease affecting left dominant side: Secondary | ICD-10-CM | POA: Diagnosis not present

## 2019-01-12 DIAGNOSIS — G4733 Obstructive sleep apnea (adult) (pediatric): Secondary | ICD-10-CM | POA: Diagnosis not present

## 2019-01-12 DIAGNOSIS — I959 Hypotension, unspecified: Secondary | ICD-10-CM | POA: Diagnosis not present

## 2019-01-12 DIAGNOSIS — Z8673 Personal history of transient ischemic attack (TIA), and cerebral infarction without residual deficits: Secondary | ICD-10-CM

## 2019-01-12 DIAGNOSIS — Z7901 Long term (current) use of anticoagulants: Secondary | ICD-10-CM

## 2019-01-12 DIAGNOSIS — F32A Depression, unspecified: Secondary | ICD-10-CM

## 2019-01-12 DIAGNOSIS — F329 Major depressive disorder, single episode, unspecified: Secondary | ICD-10-CM

## 2019-01-12 MED ORDER — MIDODRINE HCL 5 MG PO TABS: 5.0000 mg | ORAL_TABLET | Freq: Three times a day (TID) | ORAL | 5 refills | Status: DC

## 2019-01-12 NOTE — Telephone Encounter (Signed)
Re-order [id number]

## 2019-01-12 NOTE — Progress Notes (Signed)
[facility]  PATIENT: Gregory Wiley DOB: [birth year]  REFERRING CLINICIAN: Tamsen Roers HISTORY FROM: patient   HISTORICAL  CHIEF COMPLAINT:  Chief Complaint  Patient presents with  . Follow-up    RM 12 with wife. Last seen 10/08/2018. Hx CVA 2015. Taking tizanidine for muscle spasms. Gabapentin for parathesia's, vicodin for pain. Nuvigil.   . Gait Problem    In Wheelchair in office today. Was able to stand to get a weight.Has gait belt today. Also has quad cane with him.  In last few months this has been getting worse. Doing home PT ordered by PCP.     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   He feels he is stable for the most part.    Update 01/12/2019: He is noting more lightheadedness.    He has no syncope.    He notes it with standing or sitting.  It occurs daily and is persistent.  He notes it more later in the day.   It occurs more intensely while standing but also occurs while sitting.    He sometimes feels nausea with a flushing sensation.   He stopped the Crestor for a few weeks to see if symptoms improved but they did not.  He is on midodrine 2.5 mg po bid and does not think it helps much.    His BP is often low but was high before the stroke.      He continues to have difficulty with his gait due to the left-sided weakness since the stroke.  He can go short distances with a cane.  He is noting the left foot bounces when he tries to walk  He has OSA and is on CPAP.  He still has lethargy and sleepiness.   Ritalin and Nuvigil at different times have not helped.     Update 1.23/2020: He denies any new symptoms since the last visit.   He felt funny last week feeling unfocused and was concerned he might have a stroke but everything was back to baseline after resting.   His wife felt he looked pale.  He notes the left pinky was twitching for a few minutes but no other symptoms.    He is on  Eliquis and notes easy bruising.   He has had a slow drop in the hemoglobin and his doctors may refer for coloscopy.    He can walk 60-100 feet with a cene and has no recent falls.    His mouth is dry.   He feels his bladder is ok.     He has rare fecal incontinence if he takes senokot for constipation.     He is very apathetic and notes some depression.   He is on sertraline..    He has a short temper.   He has OSA and is on CPAP.    He has not been on Nuvigil.   He was on Provigil shortly after the stroke but it had not helped much.      Update 08/06/2017: He denies new stroke like symptoms but notes swallowing is harder.    He needs to wash everything down or it will get stuck.    His mouth is often dry.     He is on Eliquis and notes he bruises easily, especially when a puppy jumps on him.     From the stroke, he has left sided weakness and spasticity  Tizanidine is helping the spasticity.  He is walking a little bit better and now can go about 80 feet using a cane before stopping.       Mood is much better and we discussed stopping the zoloft  He uses CPAP nightly and has a new machine.     Download shows great AHI at 2.5 and 100% compliance and average usage of 7 hours.   He notes some mental fogginess at times but this is no worse.   He fatigues easily.      He notes some neck pain but is doing better with a new pillow.  He does not need a TPI.        Update 03/06/2017:    He denies any new neurologic symptoms.    He has spastic left hemiplegia and left foot drop.  He uses his cane around the house.  He can go 30 feet without too much of a problem A brace is heavy and he would rather walk without it.    He had one fall last month.   His foot is not flat when he stands up on the floor.   His neck is hurting more.  Pain stays in his neck with no radiation into the arm.    He feels left spasticity is about the same.    At times, the spasticity can be painful in the leg.  Cognition is stable.  His left sided neglect improved over time.   He is on Eliquis as anticoagulation.  He has OSA and has a new machine and mask.   He showed me an app and he had 3.3 - 8.1 AHI over the past week.         He notes neck pain, right greater than left. He does not want trigger point injection. In the past, TPI's have helped but he finds them to be painful.  From 09/16/2016: Stroke:   He denies any new stroke symptoms. He remains on Elavil risk for stroke prophylaxis. He has not had any transient ischemic attacks or other transient neurologic symptoms. He continues to have left hemiplegia and he gets slurred speech when he is tired.   Stroke History:  On 02/12/2014, he woke up with weakness on the left side and was unable to stand up. He went to the emergency room.Marland Kitchen MRI of the brain showed an acute right MCA territory stroke that mostly involved the right temporal lobe with extension to the insula and the basal ganglia on the right. MR angiogram showed an occlusion of the right M1 segment  Of note, he had been on aspirin and Plavix that had been discontinued a few weeks earlier due to a bleeding ulcer.   Ultrasound of the carotid arteries showed less than 39% stenosis. A transesophageal echocardiogram showed a LVEF equals 45%, there was septal hypokinesia, no PFO or ASD was identified.  Cardioembolism was suspected.  He was initially placed on Plavix but that was subsequently changed to Xarelto after he was found to have pulmonary embolisms while at rehabilitation center.  Gait/Strength/sensation:  Due to left hemiplegia, his gait is poor. He has had a few falls. With a quad cane he can walk about 30-50 feet if he is not tired and about 20-30 feet if he is tired. His bathroom is 30 feet away.  In the past, he was having orthostatic hypotension and had some episodes of syncope.    His leg also has left sided spasticity.   He also  has numbness on the left side with some pain. He had some neglect and numbness  initially that improved.   He has been on baclofen in the past but felt no benefit and felt very sleepy.    Orthostatic hypotension:     He is on Florinef and midodrine.  He gets pre-syncope but no recent syncope.     Neck pain/Headache:  He continues to report a neck pain, better than last year but still daily.   In the past, occiptial nerve blocks helped to reduce the severe headache and neck pain.    Bladder:  He has urinary frequency and urgency this year.   He has not had incontinence.   Flomax was started but he flet dizzy and stopped.    OSA/sleep/sleepiness:    In the past, he had obstructive sleep apnea but stopped CPAP as he did not like the mask. However, he has lost > 150 pounds since the stroke.   He still snores but wife has not noted OSA.      He has excessive daytime sleepiness.  Methylphenidate 20 mg never really helped.     In the past, EDS was better when he wore CPAP but he can't wear he whole night.      Insomnia:   He has sleep maintenance insomnia > sleep onet (usually quick).    He takes melatonin at bedtime.   Cognitive:   He and his wife note some decreased attention and decrease executive function abilities.  He had a neglect after the stroke which appears to improve.  Mood:    He continues to feel depressed. He thinks this certainly may have helped slightly but not much. He gets irritable very easily. He denies anxiety.  REVIEW OF SYSTEMS:  Constitutional: No fevers, chills, sweats, or change in appetite.  He sleeps poorly.    He has daytime sleepiness.   Eyes: No visual changes, double vision, eye pain Ear, nose and throat: No hearing loss, ear pain, nasal congestion, sore throat Cardiovascular: No chest pain, palpitations Respiratory:  No shortness of breath at rest or with exertion.   No wheezes.  He has OSA and is on CPAP GastrointestinaI: No nausea, vomiting, diarrhea, abdominal pain, fecal incontinence Genitourinary:  No dysuria, urinary retention or  frequency.  No nocturia. Musculoskeletal:  No neck pain, back pain but he has shoulder and hip pain Integumentary: No rash, pruritus, skin lesions Neurological: as above Psychiatric: No depression at this time.  No anxiety Endocrine: No palpitations, diaphoresis, change in appetite, change in weigh.  He notes feeling cold and increased thirst Hematologic/Lymphatic:  No anemia, purpura, petechiae. Allergic/Immunologic: No itchy/runny eyes, nasal congestion, recent allergic reactions, rashes  ALLERGIES: Allergies  Allergen Reactions  . Diphenhydramine Hcl Anaphylaxis  . Adhesive [Tape] Other (See Comments)    Tears skin - please use paper tape  . Proscar [Finasteride] Other (See Comments)    Lightheaded.   . Tamsulosin Other (See Comments)    Dizzy, vomiting.     HOME MEDICATIONS: Outpatient Medications Prior to Visit  Medication Sig Dispense Refill  . acetaminophen (TYLENOL) 325 MG tablet Take 1 tablet (325 mg total) by mouth every 6 (six) hours as needed for mild pain.    Marland Kitchen albuterol (PROVENTIL HFA;VENTOLIN HFA) 108 (90 Base) MCG/ACT inhaler Inhale 2 puffs into the lungs every 6 (six) hours as needed for wheezing or shortness of breath. 1 Inhaler 0  . apixaban (ELIQUIS) 5 MG TABS tablet Take 1 tablet (5 mg total)  by mouth 2 (two) times daily. 180 tablet 2  . Armodafinil 200 MG TABS TAKE 1 TABLET BY MOUTH EVERY MORNING 30 tablet 5  . cholecalciferol (VITAMIN D) 1000 units tablet Take 1,000 Units by mouth daily.    Marland Kitchen doxepin (SINEQUAN) 10 MG capsule TAKE 1 CAPSULE (10 MG TOTAL) BY MOUTH AT BEDTIME. 30 capsule 11  . ELDERBERRY PO Take by mouth. 3 a day    . FEROSUL 325 (65 Fe) MG tablet TAKE 1 TABLET BY MOUTH 2 TIMES DAILY WITH A MEAL. 60 tablet 5  . fluticasone (FLONASE) 50 MCG/ACT nasal spray Place 2 sprays into both nostrils daily. (Patient taking differently: Place 2 sprays into both nostrils daily as needed. )    . gabapentin (NEURONTIN) 300 MG capsule Take 1 capsule (300 mg total)  by mouth at bedtime. 30 capsule 1  . HYDROcodone-acetaminophen (NORCO/VICODIN) 5-325 MG tablet Take 1 tablet by mouth daily as needed for moderate pain. 30 tablet 0  . hydrocortisone (ANUSOL-HC) 25 MG suppository Place 1 suppository (25 mg total) rectally 2 (two) times daily as needed. X 1 week; then, use as needed thereafter. 24 suppository 3  . Melatonin 10 MG CAPS TAKE 1 CAPSULE BY MOUTH AT BEDTIME AS NEEDED FOR SLEEP 90 capsule 3  . nitroGLYCERIN (NITROSTAT) 0.4 MG SL tablet Place 1 tablet (0.4 mg total) under the tongue every 5 (five) minutes as needed for chest pain.    . NONFORMULARY OR COMPOUNDED ITEM Supplement menmoringa oleifera taken daily.    Marland Kitchen nystatin (MYCOSTATIN/NYSTOP) powder Apply topically 2 (two) times daily. 60 g 3  . OVER THE COUNTER MEDICATION Ceylon Cinnamon- takes 2 a day, 1200 mg    . pantoprazole (PROTONIX) 40 MG tablet TAKE 1 TABLET BY MOUTH 2 TIMES DAILY. 60 tablet 6  . potassium chloride (K-DUR) 10 MEQ tablet TAKE 1 TABLETS BY MOUTH 2 TIMES DAILY. 360 tablet 3  . PRESCRIPTION MEDICATION Inhale into the lungs at bedtime. CPAP    . prochlorperazine (COMPAZINE) 10 MG tablet Take 1 tablet (10 mg total) by mouth every 8 (eight) hours as needed (headache, nausea or vomiting). 10 tablet 0  . QC LO-DOSE ASPIRIN 81 MG EC tablet TAKE 1 TABLET (81 MG TOTAL) BY MOUTH DAILY. SWALLOW WHOLE. 90 tablet 3  . QC STOOL SOFTENER PLS LAXATIVE 8.6-50 MG tablet TAKE 1 TABLET BY MOUTH 2 TIMES DAILY. 60 tablet 2  . rosuvastatin (CRESTOR) 40 MG tablet Held for 1 week starting 12/14/18.    Marland Kitchen senna (SENOKOT) 8.6 MG tablet Take 1 tablet by mouth 2 (two) times daily.    . sertraline (ZOLOFT) 100 MG tablet Take 1 tablet (100 mg total) by mouth daily. 90 tablet 3  . tiZANidine (ZANAFLEX) 4 MG capsule Take 1 capsule (4 mg total) by mouth as needed. 90 capsule 3  . midodrine (PROAMATINE) 2.5 MG tablet TAKE 1 TABLET BY MOUTH EVERY DAY 30 tablet 2   No facility-administered medications prior to visit.      PAST MEDICAL HISTORY: Past Medical History:  Diagnosis Date  . Adrenal insufficiency ([place])   . Anxiety   . Basal cell carcinoma of skin of left ankle   . Bleeding stomach ulcer 2015  . Coronary artery disease    stent, then CABG 07/20/10  . Daily headache    "for the past month" (11/11/2014)  . Depression   . Diverticulosis   . GERD (gastroesophageal reflux disease)   . Gout   . H/O cardiomyopathy    ischemic,  now last echo 07/30/11, EF 38%W  . History of hiatal hernia   . Hyperlipidemia   . Hypertension   . Internal hemorrhoids   . Left hemiplegia ([place])   . NSTEMI (non-ST elevated myocardial infarction) ([place])    NSTEMI- last cath 06/2011-Stent to LCX-DES, last nuc 07/30/11 low risk  . Obesity (BMI 30-39.9)   . OSA on CPAP    since July 2013- uses CPAP sometimes , states he has lost 147 lbs. since stroke & doesn't use the CPAP as much as he use to.   . Plantar fasciitis of left foot   . Pneumonia    hosp. 12/2014  . Pulmonary embolism ([place]) 03/2014  . Stroke ([place]) 01/2014   "no control of LUE, can slightly LLE; memory problems since" (11/11/2014)  . Tubular adenoma of colon     PAST SURGICAL HISTORY: Past Surgical History:  Procedure Laterality Date  . BASAL CELL CARCINOMA EXCISION Left 2015   ankle  . CARDIAC SURGERY    . CATARACT EXTRACTION Bilateral   . COLONOSCOPY N/A 02/10/2013   Procedure: COLONOSCOPY;  Surgeon: Ladene Artist, MD;  Location: [facility];  Service: Endoscopy;  Laterality: N/A;  . CORONARY ANGIOPLASTY WITH STENT PLACEMENT  07/01/2011   DES-Resolute to native LCX  . CORONARY ANGIOPLASTY WITH STENT PLACEMENT  12/01/2007   COMPLEX 5 LESION PCI INCLUDING CUTTING BALLOON AND 4 CYPHER DESs  . CORONARY ARTERY BYPASS GRAFT  07/20/2010    LIMA-LAD; VG-ACUTE MARG of RCA; Seq VG-distal RCA & then pda  . EP IMPLANTABLE DEVICE N/A 02/14/2016   Procedure: Loop Recorder Removal;  Surgeon: Thompson Grayer, MD;  Location: [facility];  Service: Cardiovascular;   Laterality: N/A;  . ESOPHAGOGASTRODUODENOSCOPY  01/2014   gastric ulcer, erosive gastroduodenitis, H Pylori negative.   . ESOPHAGOGASTRODUODENOSCOPY (EGD) WITH PROPOFOL N/A 11/14/2014   Procedure: ESOPHAGOGASTRODUODENOSCOPY (EGD) WITH PROPOFOL;  Surgeon: Milus Banister, MD;  Location: [facility];  Service: Endoscopy;  Laterality: N/A;  . HAND SURGERY Right    to take glass out  . HERNIA REPAIR    . INTRAMEDULLARY (IM) NAIL INTERTROCHANTERIC Right 02/07/2015   Procedure: INTRAMEDULLARY (IM) NAIL INTERTROCHANTRIC RIGHT HIP;  Surgeon: Renette Butters, MD;  Location: [facility];  Service: Orthopedics;  Laterality: Right;  . KNEE ARTHROSCOPY Right   . LEFT HEART CATHETERIZATION WITH CORONARY/GRAFT ANGIOGRAM  07/01/2011   Procedure: LEFT HEART CATHETERIZATION WITH Beatrix Fetters;  Surgeon: Sanda Klein, MD;  Location: [facility];  Service: Cardiovascular;;  . LOOP RECORDER IMPLANT  02/15/2014   MDT LINQ implanted by Dr Rayann Heman for cryptogenic stroke  . PERCUTANEOUS CORONARY STENT INTERVENTION (PCI-S) Right 07/01/2011   Procedure: PERCUTANEOUS CORONARY STENT INTERVENTION (PCI-S);  Surgeon: Sanda Klein, MD;  Location: [facility];  Service: Cardiovascular;  Laterality: Right;  . PICC LINE PLACE PERIPHERAL ([place] HX)  06/2015  . RETINAL DETACHMENT SURGERY Right   . TEE WITHOUT CARDIOVERSION N/A 02/15/2014   Procedure: TRANSESOPHAGEAL ECHOCARDIOGRAM (TEE);  Surgeon: Josue Hector, MD;  Location: [facility];  Service: Cardiovascular;  Laterality: N/A;  . UMBILICAL HERNIA REPAIR  2006    FAMILY HISTORY: Family History  Problem Relation Age of Onset  . Diabetes type II Mother   . CAD Father 54       CABG, PCI, died at 69  . Heart attack Father 98  . Hypertension Brother   . Diabetes Brother   . Hyperlipidemia Brother   . Brain cancer Maternal Grandmother   . COPD Paternal  Grandfather   . Coronary artery disease Other   . Other Other        denies family history of DVT/PE  . Colon  cancer Neg Hx   . Stroke Neg Hx   . Prostate cancer Neg Hx     SOCIAL HISTORY:  Social History   Socioeconomic History  . Marital status: Married    Spouse name: Not on file  . Number of children: 5  . Years of education: Not on file  . Highest education level: Not on file  Occupational History    Employer: [employer]    Comment: works at the [employer]  . Financial resource strain: Not on file  . Food insecurity    Worry: Not on file    Inability: Not on file  . Transportation needs    Medical: Not on file    Non-medical: Not on file  Tobacco Use  . Smoking status: Former Smoker    Packs/day: 2.00    Years: 4.00    Pack years: 8.00    Types: Cigarettes    Quit date: 01/30/1969    Years since quitting: 49.9  . Smokeless tobacco: Never Used  . Tobacco comment: "quit smoking cigarettes  in the 1970's"  Substance and Sexual Activity  . Alcohol use: No    Alcohol/week: 0.0 standard drinks  . Drug use: No    Comment: "smoked some pot in college"  . Sexual activity: Not Currently  Lifestyle  . Physical activity    Days per week: Not on file    Minutes per session: Not on file  . Stress: Not on file  Relationships  . Social Herbalist on phone: Not on file    Gets together: Not on file    Attends religious service: Not on file    Active member of club or organization: Not on file    Attends meetings of clubs or organizations: Not on file    Relationship status: Not on file  . Intimate partner violence    Fear of current or ex partner: Not on file    Emotionally abused: Not on file    Physically abused: Not on file    Forced sexual activity: Not on file  Other Topics Concern  . Not on file  Social History Narrative   Quit smoking 40 years ago   Married 1996   Likes to play guitar and sing with church group unable since 02/12/14 stroke    [employer] 1970-1972, [place], no service related issues.     Left handed but now right handed after  stroke     PHYSICAL EXAM  Vitals:   01/12/19 1307  BP: (!) 100/58  Pulse: 77  Temp: (!) 96.6 F (35.9 C)  SpO2: 98%  Weight: 253 lb (114.8 kg)  Height: 6' (1.829 m)    Body mass index is 34.31 kg/m.  Orthostatic Sitting:     98/60   80 Standing 30 s:  95/55  84 Standing 2 min:  98/60   92  General: The patient is well-developed and well-nourished and in no acute distress.  He is in a wheelchair.  Neck:  There are no carotid bruits. The neck is mildly tender in the lower cervical paraspinal region and the trapezius muscles..     Skin: He has mild edema at the left ankle.  Neurologic Exam  Mental status: The patient is alert and oriented x 3 at the time of  the examination. The patient has apparent normal recent and remote memory. Attention is mildly reduced.   Speech is normal.   There is no neglect.  Cranial nerves: Extraocular movements are full.  Facial strength and sensation is normal.  Trapezius strength is normal.. No dysarthria is noted.  No obvious hearing deficits are noted.  Motor:  Muscle bulk is normal.  He has increased muscle tone on the left.   strength is 5/5 on the right. Strength is 2/5 in left shoulder elevation and 1/5 in proximal arm strength.  Strength is 2/5 in the proximal left leg and 3/5 in the quadriceps and 2/5 in the distal leg  Sensory: He has intact sensation to touch and vibration  Coordination:  Finger-nose-finger and heel-to-shin is performed well on the right but he cannot do either of these on the left.    Gait and station: He needs support to stand and to take any steps  Reflexes: Deep tendon reflexes are increased on the left.Marland Kitchen      DIAGNOSTIC DATA (LABS, IMAGING, TESTING) - I reviewed patient records, labs, notes, testing and imaging myself where available.  Lab Results  Component Value Date   WBC 6.9 12/14/2018   HGB 14.9 12/14/2018   HCT 45.5 12/14/2018   MCV 90.0 12/14/2018   PLT 157.0 12/14/2018      Component Value  Date/Time   NA 141 12/14/2018 1446   NA 143 06/02/2018 0000   K 4.0 12/14/2018 1446   CL 104 12/14/2018 1446   CO2 27 12/14/2018 1446   GLUCOSE 95 12/14/2018 1446   BUN 15 12/14/2018 1446   BUN 17 06/02/2018 0000   CREATININE 1.13 12/14/2018 1446   CREATININE 1.14 09/11/2018 1501   CALCIUM 9.5 12/14/2018 1446   PROT 7.5 12/14/2018 1446   PROT 6.6 06/02/2018 0000   ALBUMIN 4.0 12/14/2018 1446   ALBUMIN 3.9 06/02/2018 0000   AST 19 12/14/2018 1446   ALT 15 12/14/2018 1446   ALKPHOS 65 12/14/2018 1446   BILITOT 0.5 12/14/2018 1446   BILITOT 0.3 06/02/2018 0000   GFRNONAA 77 06/02/2018 0000   GFRNONAA >89 09/13/2014 1032   GFRAA 89 06/02/2018 0000   GFRAA >89 09/13/2014 1032   Lab Results  Component Value Date   CHOL 134 06/02/2018   HDL 57 06/02/2018   LDLCALC 65 06/02/2018   TRIG 59 06/02/2018   CHOLHDL 2.4 06/02/2018   Lab Results  Component Value Date   HGBA1C 5.6 02/13/2014   Lab Results  Component Value Date   VITAMINB12 286 07/05/2015   Lab Results  Component Value Date   TSH 1.640 06/02/2018        ASSESSMENT AND PLAN  History of completed stroke - Plan: US Carotid Bilateral  Hemiplegia affecting left side in left-dominant patient as late effect of cerebrovascular disease (HCC)  OSA (obstructive sleep apnea)  Hypotension, unspecified hypotension type  Depression, unspecified depression type  Chronic anticoagulation    1.    He will continue Eliquis for anticoagulation.    2.    Use 4 prong cane around the house and wheelchair for longer distance.   3.    Increase proamatine to 5 mg po tid --- if not better, consider changing to pyridostigmine.  4.    Continue regular use of CPAP.   Nuvigil and Ritalin have not helped in the past for the excessive daytime sleepiness.   5.    He will return to see me in 5-6 months or sooner  if he has new or worsening neurologic symptoms.    A. Felecia Shelling, MD, PhD [id number], [id number] PM Certified in Neurology,  Clinical Neurophysiology, Sleep Medicine, Pain Medicine and Neuroimaging  [facility] [address], [place], [place] [address] [phone]

## 2019-01-13 NOTE — Addendum Note (Signed)
Addended by: Hope Pigeon on: 01/13/2019 07:40 AM   Modules accepted: Orders

## 2019-01-18 NOTE — Telephone Encounter (Signed)
Patient is scheduled for 01/21/2019 . Patient aware.

## 2019-01-19 ENCOUNTER — Encounter (HOSPITAL_COMMUNITY): Payer: Medicare Other

## 2019-01-21 ENCOUNTER — Ambulatory Visit (HOSPITAL_COMMUNITY)
Admission: RE | Admit: 2019-01-21 | Discharge: 2019-01-21 | Disposition: A | Discharge status: Home / Self Care | Payer: Medicare Other | Source: Ambulatory Visit | Attending: Neurology | Admitting: Neurology

## 2019-01-21 ENCOUNTER — Other Ambulatory Visit: Payer: Self-pay

## 2019-01-21 DIAGNOSIS — Z8673 Personal history of transient ischemic attack (TIA), and cerebral infarction without residual deficits: Secondary | ICD-10-CM | POA: Diagnosis not present

## 2019-01-21 NOTE — Progress Notes (Signed)
VASCULAR LAB PRELIMINARY  PRELIMINARY  PRELIMINARY  PRELIMINARY  Carotid duplex completed.    Preliminary report:  See CV proc for preliminary results.   Ival Pacer, RVT 01/21/2019, 10:41 AM

## 2019-01-25 ENCOUNTER — Telehealth: Payer: Self-pay | Admitting: *Deleted

## 2019-01-25 NOTE — Telephone Encounter (Signed)
-----   Message from Britt Bottom, MD sent at 01/21/2019  6:17 PM EST ----- Please let the patient know that the carotid doppler looked ok... no significant stenosis or blockage

## 2019-01-29 ENCOUNTER — Other Ambulatory Visit: Payer: Self-pay | Admitting: Cardiovascular Disease

## 2019-02-10 ENCOUNTER — Encounter: Payer: Self-pay | Admitting: Cardiovascular Disease

## 2019-02-10 ENCOUNTER — Other Ambulatory Visit: Payer: Self-pay

## 2019-02-10 ENCOUNTER — Ambulatory Visit (INDEPENDENT_AMBULATORY_CARE_PROVIDER_SITE_OTHER): Payer: Medicare Other | Admitting: Cardiovascular Disease

## 2019-02-10 VITALS — BP 119/72 | HR 66 | Temp 98.7°F | Ht 72.0 in

## 2019-02-10 DIAGNOSIS — Z7901 Long term (current) use of anticoagulants: Secondary | ICD-10-CM

## 2019-02-10 DIAGNOSIS — I255 Ischemic cardiomyopathy: Secondary | ICD-10-CM | POA: Diagnosis not present

## 2019-02-10 DIAGNOSIS — I959 Hypotension, unspecified: Secondary | ICD-10-CM

## 2019-02-10 DIAGNOSIS — I25810 Atherosclerosis of coronary artery bypass graft(s) without angina pectoris: Secondary | ICD-10-CM | POA: Diagnosis not present

## 2019-02-10 DIAGNOSIS — G4733 Obstructive sleep apnea (adult) (pediatric): Secondary | ICD-10-CM | POA: Diagnosis not present

## 2019-02-10 DIAGNOSIS — Z951 Presence of aortocoronary bypass graft: Secondary | ICD-10-CM

## 2019-02-10 DIAGNOSIS — I63411 Cerebral infarction due to embolism of right middle cerebral artery: Secondary | ICD-10-CM

## 2019-02-10 DIAGNOSIS — I480 Paroxysmal atrial fibrillation: Secondary | ICD-10-CM

## 2019-02-10 NOTE — Patient Instructions (Signed)
Medication Instructions:  Your physician recommends that you continue on your current medications as directed. Please refer to the Current Medication list given to you today.  *If you need a refill on your cardiac medications before your next appointment, please call your pharmacy*  Lab Work: NONE If you have labs (blood work) drawn today and your tests are completely normal, you will receive your results only by: Marland Kitchen MyChart Message (if you have MyChart) OR . A paper copy in the mail If you have any lab test that is abnormal or we need to change your treatment, we will call you to review the results.  Testing/Procedures: NONE  Follow-Up: At [facility], you and your health needs are our priority.  As part of our continuing mission to provide you with exceptional heart care, we have created designated Provider Care Teams.  These Care Teams include your primary Cardiologist (physician) and Advanced Practice Providers (APPs -  Physician Assistants and Nurse Practitioners) who all work together to provide you with the care you need, when you need it.  Your next appointment:   6 month(s)  The format for your next appointment:   In Person  Provider:   DR. Claiborne Billings

## 2019-02-10 NOTE — Progress Notes (Signed)
Patient ID: Gregory Wiley, male   DOB: February 25, 1951, 68 y.o.   MRN: [id number]    Primary MD: Dr. [name]  HPI: Gregory Wiley is a 68 y.o. male presents to the office today for a 6 month follow-up cardiology/sleep evaluation.   Gregory Wiley has established CAD and underwent stenting of his RCA in May 2011. He is s/p CABG surgery with LIMA to the LAD, sequential vein to the distal RCA and PDA, and vein to the acute marginal branch of his right coronary artery. In April 2013 he developed unstable angina and ruled in for non-ST segment elevation myocardial infarction. Catheterization revealed 99% stenosis of the large left circumflex vessel and he underwent successful stenting with insertion of a 4.0x18 mm resolute DES stent post dilated to 4.5 mm.  Additional problems include obstructive sleep apnea. On his initial polysomnogram in 2010 AHI was 11.3 overall, but during REM sleep he had moderate sleep apnea with an AHI of 24.8. He never did initiate CPAP therapy at that time. However, since his myocardial infarction he has been using CPAP therapy since July 2013.  When I last saw him, he was only utilizing CPAP intermittently.  He now admits to much more frequent use but there are times when he does not use treatment.  He has a history of hyperlipidemia, hypertension, and morbid obesity. An echo Doppler study in May 2013 suggested an ejection fraction of 38% and on nuclear imaging  was 37% with scar in the LAD territory.  He was admitted to [facility] hospital in November 2015 with a large right MCA territory CVA.  TEE was negative for embolic source.  EF was 45% on echocardiogram.  He underwent implantable loop recorder by Dr. Rayann Heman. He was readmitted with bilateral submassive pulmonary embolism.  Venous Dopplers were positive for DVT.  Ejection fraction was 25-30%, which is felt to be contributed by strain from his pulmonary emboli.  He had mild troponin elevation which was felt to be due to demand ischemia  and has been on Xarelto for anticoagulation.  He was admitted on the [facility] hospitalist service on 11/11/2014 with dehydration.  Several of his medications were held.  I reviewed his hospital record.  He had a history of nausea and vomiting that had been going on for approximatey a month.  He has continued to feel weak and in his words, "feels terrible" since his hospitalization.  He has had issues with headaches which he did see neurology.  He has not been eating well.  He seeing Dr. Damita Dunnings at [facility] for primary care.  He denies chest pressure.  He denies fevers.  He is on Xarelto anticoagulation and denies overt bleeding.  There is a remote history of erosive gastroduodenitis, GERD, esophageal stricture for which in the past he had been seen by Dr. Fuller Plan.  When I previously saw him, he was tachycardic which I felt was contributed by his Ritalin.  He has since been off this medication.   In November 2017, he had removal of his loop recorder by Dr. Rayann Heman.  This had been initially placed at that in 2015 following his large right middle cerebral artery stroke.  Over the last several years he admits to a purposeful 170 pound weight loss with a peak weight at 346 down to 193.  His initial sleep study was in September 2010.  When I last saw him in September 2018.  He complained of significant fatigability.  An Epworth Sleepiness Scale score was calculated  and this endorsed at 15 which is consistent with excessive daytime sleepiness.  He also has noticed lower extremity edema.    He underwent a sleep study on 12/14/2016.  He was found to have severe sleep apnea with an AHI of 36.1 and RDI 40.4.  CPAP was started at 5 cm and was titrated up to 12 cm.  Oxygen desaturation was 87%.  He was unable to achieve any REM sleep on the diagnostic portion of the study.  He was set up with CPAP on 02/28/2017.  DME's [facility].  Gregory Wiley was obtained from January 24 through separate 22 2019.  He is compliant with  97% of usage stays and 87% of usage greater than 4 hours.  He is averaging 7 hours and 33 minutes of sleep per night.  At 11 cm water pressure.  AHI is 1.2.  He does have moderate leak almost on a daily basis.  He has been using nasal pillows, but upon further questioning, there is significant oral venting and mouth breathing.  Epworth Sleepiness Scale score endorsed at 17 today, which is compatible with residual daytime sleepiness.   I saw him in February 2019.  At that time, I reviewed his most recent sleep study in detail.  Over the past year, he has been followed by Dr. [name].  He had issues with iron deficiency anemia.  He denies any episodes of chest pain.  He continues to use CPAP with excellent compliance.  He admits to a 40 pound weight gain over the past year.  Not been very active and has been eating more.  Download was obtained in the office today of his CPAP unit from April 25, 2018 through May 24, 2018.  Usage days was 97% with usage greater than 4 hours 90%.  Average usage was 7 hours and 53 minutes.  At a 11 cm set pressure, AHI is excellent at 1.6.  There are several days of increased leak of his mask.  [facility] is his DME company.     During that evaluation, I recommended that he undergo a 5-year follow-up nuclear perfusion study for reassessment of his coronary perfusion.  In addition, repeat laboratory was recommended on his current medical regimen.  He underwent his nuclear perfusion study on May 29, 2018.  This revealed it is for prior left scar most likely involving the circumflex territory soon with his prior MI without associated ischemia.  Nuclear EF was 34%.    He was last evaluated in a telemedicine visit on August 17, 2018.   He denied any recurrent chest pain or anginal type symptoms.  He was using CPAP with excellent compliance.  A new download was obtained in the office  from March 21 through July 05, 2018 which verified compliance with 97% of usage  days; averaging 7 hours and 53 minutes of CPAP use.  At 11 cm water pressure AHI was excellent at 1.6.   Apparently his machine was malfunctioning and is not able to generate data to allow additional reporting.  His DME company is [facility]. He typically goes to bed between 9 and 10 PM but often times may wake up at 330 to go to the bathroom.  Rather than going back to sleep he often was going to a chair and to watch television.  He admitted to daytime sleepiness.  During that evaluation I stressed the importance of using CPAP throughout the nights entirety since the second half of the night he  often was not using treatment.  I discussed the preponderance of REM sleep occurs in the second half of the night and if he has reduced sleep duration this may be a contributor to his residual daytime sleepiness.  He continues to be on Nuvigil.  Since his last evaluation, he received a new ResMed air sense 10 CPAP machine several months ago.  A new download was obtained in the office today from October 25 through February 08, 2019.  He is 100% compliant and is now back at using CPAP for 7 hours per night.  At 11 cm water pressure, AHI is 92.3/h.  He does have occasional mask leak.    He remains debilitated from his prior stroke and does admit to some dizziness with walking or standing.  In the past, I had started him on midodrine 2.5 mg twice a day for orthostatic symptomatology.  Apparently, when he he was seen by neurology, he was advised to increase that to 5 mg twice a day.  The patient states that with this increase his blood pressure became significantly elevated and therefore he stopped the medication.  He presents for follow-up.   Past Medical History:  Diagnosis Date   Adrenal insufficiency ([place])    Anxiety    Basal cell carcinoma of skin of left ankle    Bleeding stomach ulcer 2015   Coronary artery disease    stent, then CABG 07/20/10   Daily headache    "for the past month"  (11/11/2014)   Depression    Diverticulosis    GERD (gastroesophageal reflux disease)    Gout    H/O cardiomyopathy    ischemic, now last echo 07/30/11, EF 38%W   History of hiatal hernia    Hyperlipidemia    Hypertension    Internal hemorrhoids    Left hemiplegia ([place])    NSTEMI (non-ST elevated myocardial infarction) ([place])    NSTEMI- last cath 06/2011-Stent to LCX-DES, last nuc 07/30/11 low risk   Obesity (BMI 30-39.9)    OSA on CPAP    since July 2013- uses CPAP sometimes , states he has lost 147 lbs. since stroke & doesn't use the CPAP as much as he use to.    Plantar fasciitis of left foot    Pneumonia    hosp. 12/2014   Pulmonary embolism ([place]) 03/2014   Stroke ([place]) 01/2014   "no control of LUE, can slightly LLE; memory problems since" (11/11/2014)   Tubular adenoma of colon     Past Surgical History:  Procedure Laterality Date   BASAL CELL CARCINOMA EXCISION Left 2015   ankle   CARDIAC SURGERY     CATARACT EXTRACTION Bilateral    COLONOSCOPY N/A 02/10/2013   Procedure: COLONOSCOPY;  Surgeon: Ladene Artist, MD;  Location: [facility];  Service: Endoscopy;  Laterality: N/A;   CORONARY ANGIOPLASTY WITH STENT PLACEMENT  07/01/2011   DES-Resolute to native LCX   CORONARY ANGIOPLASTY WITH STENT PLACEMENT  12/01/2007   COMPLEX 5 LESION PCI INCLUDING CUTTING BALLOON AND 4 CYPHER DESs   CORONARY ARTERY BYPASS GRAFT  07/20/2010    LIMA-LAD; VG-ACUTE MARG of RCA; Seq VG-distal RCA & then pda   EP IMPLANTABLE DEVICE N/A 02/14/2016   Procedure: Loop Recorder Removal;  Surgeon: Thompson Grayer, MD;  Location: [place] [facility];  Service: Cardiovascular;  Laterality: N/A;   ESOPHAGOGASTRODUODENOSCOPY  01/2014   gastric ulcer, erosive gastroduodenitis, H Pylori negative.    ESOPHAGOGASTRODUODENOSCOPY (EGD) WITH PROPOFOL N/A 11/14/2014   Procedure: ESOPHAGOGASTRODUODENOSCOPY (  EGD) WITH PROPOFOL;  Surgeon: Milus Banister, MD;  Location: [place];  Service:  Endoscopy;  Laterality: N/A;   HAND SURGERY Right    to take glass out   HERNIA REPAIR     INTRAMEDULLARY (IM) NAIL INTERTROCHANTERIC Right 02/07/2015   Procedure: INTRAMEDULLARY (IM) NAIL INTERTROCHANTRIC RIGHT HIP;  Surgeon: Renette Butters, MD;  Location: [place];  Service: Orthopedics;  Laterality: Right;   KNEE ARTHROSCOPY Right    LEFT HEART CATHETERIZATION WITH CORONARY/GRAFT ANGIOGRAM  07/01/2011   Procedure: LEFT HEART CATHETERIZATION WITH Beatrix Fetters;  Surgeon: Sanda Klein, MD;  Location: [facility];  Service: Cardiovascular;;   LOOP RECORDER IMPLANT  02/15/2014   MDT LINQ implanted by Dr Rayann Heman for cryptogenic stroke   PERCUTANEOUS CORONARY STENT INTERVENTION (PCI-S) Right 07/01/2011   Procedure: PERCUTANEOUS CORONARY STENT INTERVENTION (PCI-S);  Surgeon: Sanda Klein, MD;  Location: [facility] C[facility] LAB;  Service: Cardiovascular;  Laterality: Right;   PICC LINE PLACE PERIPHERAL ([place] HX)  06/2015   RETINAL DETACHMENT SURGERY Right    TEE WITHOUT CARDIOVERSION N/A 02/15/2014   Procedure: TRANSESOPHAGEAL ECHOCARDIOGRAM (TEE);  Surgeon: Josue Hector, MD;  Location: [place];  Service: Cardiovascular;  Laterality: N/A;   UMBILICAL HERNIA REPAIR  2006    Allergies  Allergen Reactions   Diphenhydramine Hcl Anaphylaxis   Adhesive [Tape] Other (See Comments)    Tears skin - please use paper tape   Proscar [Finasteride] Other (See Comments)    Lightheaded.    Tamsulosin Other (See Comments)    Dizzy, vomiting.     Current Outpatient Medications  Medication Sig Dispense Refill   acetaminophen (TYLENOL) 325 MG tablet Take 1 tablet (325 mg total) by mouth every 6 (six) hours as needed for mild pain.     albuterol (PROVENTIL HFA;VENTOLIN HFA) 108 (90 Base) MCG/ACT inhaler Inhale 2 puffs into the lungs every 6 (six) hours as needed for wheezing or shortness of breath. 1 Inhaler 0   apixaban (ELIQUIS) 5 MG TABS tablet Take 1 tablet (5 mg total) by mouth 2  (two) times daily. 180 tablet 2   Armodafinil 200 MG TABS TAKE 1 TABLET BY MOUTH EVERY MORNING 30 tablet 5   cholecalciferol (VITAMIN D) 1000 units tablet Take 1,000 Units by mouth daily.     doxepin (SINEQUAN) 10 MG capsule TAKE 1 CAPSULE (10 MG TOTAL) BY MOUTH AT BEDTIME. 30 capsule 11   ELDERBERRY PO Take by mouth. 3 a day     FEROSUL 325 (65 Fe) MG tablet TAKE 1 TABLET BY MOUTH 2 TIMES DAILY WITH A MEAL. 60 tablet 5   fluticasone (FLONASE) 50 MCG/ACT nasal spray Place 2 sprays into both nostrils daily. (Patient taking differently: Place 2 sprays into both nostrils daily as needed. )     gabapentin (NEURONTIN) 300 MG capsule Take 1 capsule (300 mg total) by mouth at bedtime. 30 capsule 1   HYDROcodone-acetaminophen (NORCO/VICODIN) 5-325 MG tablet Take 1 tablet by mouth daily as needed for moderate pain. 30 tablet 0   hydrocortisone (ANUSOL-HC) 25 MG suppository Place 1 suppository (25 mg total) rectally 2 (two) times daily as needed. X 1 week; then, use as needed thereafter. 24 suppository 3   KLOR-CON M10 10 MEQ tablet TAKE 1 TABLETS BY MOUTH 2 TIMES DAILY.     Melatonin 10 MG CAPS TAKE 1 CAPSULE BY MOUTH AT BEDTIME AS NEEDED FOR SLEEP 90 capsule 3   midodrine (PROAMATINE) 5 MG tablet Take 1 tablet (5 mg total) by mouth 3 (  three) times daily. (Patient taking differently: Take 2.5 mg by mouth daily. ) 90 tablet 5   nitroGLYCERIN (NITROSTAT) 0.4 MG SL tablet Place 1 tablet (0.4 mg total) under the tongue every 5 (five) minutes as needed for chest pain.     NONFORMULARY OR COMPOUNDED ITEM Supplement menmoringa oleifera taken daily.     nystatin (MYCOSTATIN/NYSTOP) powder Apply topically 2 (two) times daily. 60 g 3   OVER THE COUNTER MEDICATION [place] Cinnamon- takes 2 a day, 1200 mg     pantoprazole (PROTONIX) 40 MG tablet TAKE 1 TABLET BY MOUTH TWICE A DAY 60 tablet 1   potassium chloride (K-DUR) 10 MEQ tablet TAKE 1 TABLETS BY MOUTH 2 TIMES DAILY. 360 tablet 3   PRESCRIPTION  MEDICATION Inhale into the lungs at bedtime. CPAP     prochlorperazine (COMPAZINE) 10 MG tablet Take 1 tablet (10 mg total) by mouth every 8 (eight) hours as needed (headache, nausea or vomiting). 10 tablet 0   QC LO-DOSE ASPIRIN 81 MG EC tablet TAKE 1 TABLET (81 MG TOTAL) BY MOUTH DAILY. SWALLOW WHOLE. 90 tablet 3   QC STOOL SOFTENER PLS LAXATIVE 8.6-50 MG tablet TAKE 1 TABLET BY MOUTH 2 TIMES DAILY. 60 tablet 2   rosuvastatin (CRESTOR) 40 MG tablet Held for 1 week starting 12/14/18.     senna (SENOKOT) 8.6 MG tablet Take 1 tablet by mouth 2 (two) times daily.     sertraline (ZOLOFT) 100 MG tablet Take 1 tablet (100 mg total) by mouth daily. 90 tablet 3   tiZANidine (ZANAFLEX) 4 MG capsule Take 1 capsule (4 mg total) by mouth as needed. 90 capsule 3   No current facility-administered medications for this visit.     Social History   Socioeconomic History   Marital status: Married    Spouse name: Not on file   Number of children: 5   Years of education: Not on file   Highest education level: Not on file  Occupational History    Employer: [employer]    Comment: works at the [employer] [place]: Not on file   Food insecurity    Worry: Not on file    Inability: Not on file   Transportation needs    Medical: Not on file    Non-medical: Not on file  Tobacco Use   Smoking status: Former Smoker    Packs/day: 2.00    Years: 4.00    Pack years: 8.00    Types: Cigarettes    Quit date: 01/30/1969    Years since quitting: 50.0   Smokeless tobacco: Never Used   Tobacco comment: "quit smoking cigarettes  in the 1970's"  Substance and Sexual Activity   Alcohol use: No    Alcohol/week: 0.0 standard drinks   Drug use: No    Comment: "smoked some pot in college"   Sexual activity: Not Currently  Lifestyle   Physical activity    Days per week: Not on file    Minutes per session: Not on file   Stress: Not on file  Relationships     Social connections    Talks on phone: Not on file    Gets together: Not on file    Attends religious service: Not on file    Active member of club or organization: Not on file    Attends meetings of clubs or organizations: Not on file    Relationship status: Not on file   Intimate partner violence  Fear of current or ex partner: Not on file    Emotionally abused: Not on file    Physically abused: Not on file    Forced sexual activity: Not on file  Other Topics Concern   Not on file  Social History Narrative   Quit smoking 40 years ago   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     Left handed but now right handed after stroke    Family History  Problem Relation Age of Onset   Diabetes type II Mother    CAD Father 64       CABG, PCI, died at 73   Heart attack Father 34   Hypertension Brother    Diabetes Brother    Hyperlipidemia Brother    Brain cancer Maternal Grandmother    COPD Paternal Grandfather    Coronary artery disease Other    Other Other        denies family history of DVT/PE   Colon cancer Neg Hx    Stroke Neg Hx    Prostate cancer Neg Hx    Additional social history is notable in that he is married has 5 children 4 grandchildren. He does try to walk. There is no tobacco or alcohol use.  ROS General: Negative; No fevers, chills, or night sweats; positive for weight gain HEENT: Negative; No changes in vision or hearing, sinus congestion, difficulty swallowing Pulmonary: Negative; No cough, wheezing, shortness of breath, hemoptysis Cardiovascular: see HPI GI: History of recent nausea and vomiting. GU: Negative; No dysuria, hematuria, or difficulty voiding Musculoskeletal: Negative; no myalgias, joint pain, or weakness Hematologic/Oncology: Negative; no easy bruising, bleeding Endocrine: Negative; no heat/cold intolerance; no  diabetes Neuro: Right MCA stroke with residual left-sided weakness Skin: Negative; No rashes or skin lesions Psychiatric: Negative; No behavioral problems, depression Sleep: Positive for sleep apnea on CPAP; initial CPAP set up 02/28/2017 ;  daytime sleepiness, hypersomnolence;  no bruxism, restless legs, hypnogognic hallucinations, no cataplexy Other comprehensive 14 point system review is negative.  PE BP 119/72    Pulse 66    Temp 98.7 F (37.1 C)    Ht 6' (1.829 m)    SpO2 97%    BMI 34.31 kg/m    The patient was in a wheelchair.  Repeat blood pressure by me 110/70 in the sitting position and 108/70 in the standing position.    Wt Readings from Last 3 Encounters:  01/12/19 253 lb (114.8 kg)  12/14/18 255 lb (115.7 kg)  09/11/18 257 lb 7 oz (116.8 kg)   General: Alert, oriented, no distress.  Bearded Skin: normal turgor, no rashes, warm and dry HEENT: Normocephalic, atraumatic. Pupils equal round and reactive to light; sclera anicteric; extraocular muscles intact;  Nose without nasal septal hypertrophy Mouth/Parynx benign; Mallinpatti scale 3 Neck: No JVD, no carotid bruits; normal carotid upstroke Lungs: clear to ausculatation and percussion; no wheezing or rales Chest wall: without tenderness to palpitation Heart: PMI not displaced, RRR, s1 s2 normal, 1/6 systolic murmur, no diastolic murmur, no rubs, gallops, thrills, or heaves Abdomen: soft, nontender; no hepatosplenomehaly, BS+; abdominal aorta nontender and not dilated by palpation. Back: no CVA tenderness Pulses 2+ Musculoskeletal: full range of motion, normal strength, no joint deformities Extremities: no clubbing cyanosis or edema, Homan's sign negative  Neurologic: Residual left hemiparesis Psychologic: Normal mood and affect   ECG (independently read by me): Normal  sinus rhythm at 65 bpm, left axis deviation, LVH with repolarization changes, mild RV conduction delay.  Anterior Q waves V1, and V3 to V6  March 2020  ECG (independently read by me): Normal sinus rhythm at 64 bpm.  Left axis deviation.  LVH with repolarization changes.  QS complex V1 through V4 QTc interval 468 ms.  12/04/2016 ECG (independently read by me): Normal sinus rhythm at 74 bpm.  QRS complex V1 through V3.  Previously noted lateral ST changes.  Normal intervals.  March 2017 ECG (independently read by me):  Normal sinus rhythm at 61 bpm. Poor R wave progression. Lateral ST segment changes.  QTc interval 469 ms  September 2016 ECG (independently read by me): Sinus tachycardia 1 18 bpm.  Left axis deviation.  Cannot rule out old anterolateral infarct.  ECG (independently read by me): Normal sinus rhythm at 93 bpm.  QS complex anteriorly compatible with old anterior MI  April 2016 ECG (independently read by me): Normal sinus rhythm at 79 bpm.  Old anteroseptal MI with QS complex V1 through V3.  LVH by voltage criteria in aVL.  Increased QTc interval 488 ms.  ECG: Normal sinus rhythm at 60. QS complex in V1 through V3 concordant with his documented LAD region scar. T-wave inversion in leads 1 and avL unchanged.   LABS:  BMP Latest Ref Rng & Units 12/14/2018 09/11/2018 06/02/2018  Glucose 70 - 99 mg/dL 95 99 90  BUN 6 - 23 mg/dL '15 15 17  '$ Creatinine 0.40 - 1.50 mg/dL 1.13 1.14 1.00  BUN/Creat Ratio 6 - 22 (calc) - NOT APPLICABLE 17  Sodium 076 - 145 mEq/L 141 141 143  Potassium 3.5 - 5.1 mEq/L 4.0 4.3 4.1  Chloride 96 - 112 mEq/L 104 106 106  CO2 19 - 32 mEq/L '27 29 25  '$ Calcium 8.4 - 10.5 mg/dL 9.5 8.9 8.7    Hepatic Function Latest Ref Rng & Units 12/14/2018 06/02/2018 03/13/2018  Total Protein 6.0 - 8.3 g/dL 7.5 6.6 7.0  Albumin 3.5 - 5.2 g/dL 4.0 3.9 3.9  AST 0 - 37 U/L '19 21 20  '$ ALT 0 - 53 U/L '15 21 21  '$ Alk Phosphatase 39 - 117 U/L 65 67 57  Total Bilirubin 0.2 - 1.2 mg/dL 0.5 0.3 0.5  Bilirubin, Direct 0.0 - 0.3 mg/dL - - -    CBC Latest Ref Rng & Units 12/14/2018 09/11/2018 06/02/2018  WBC 4.0 - 10.5 K/uL 6.9 6.1 6.4   Hemoglobin 13.0 - 17.0 g/dL 14.9 14.7 14.3  Hematocrit 39.0 - 52.0 % 45.5 44.1 41.6  Platelets 150.0 - 400.0 K/uL 157.0 148 137(L)   Lab Results  Component Value Date   MCV 90.0 12/14/2018   MCV 87.7 09/11/2018   MCV 86 06/02/2018   Lab Results  Component Value Date   TSH 1.640 06/02/2018   Lab Results  Component Value Date   HGBA1C 5.6 02/13/2014    BNP    Component Value Date/Time   PROBNP 73.0 10/28/2017 1612    Lipid Panel     Component Value Date/Time   CHOL 134 06/02/2018 0000   CHOL 199 01/04/2013 1206   TRIG 59 06/02/2018 0000   TRIG 113 01/04/2013 1206   HDL 57 06/02/2018 0000   HDL 47 01/04/2013 1206   CHOLHDL 2.4 06/02/2018 0000   CHOLHDL 2 08/30/2016 1508   VLDL 8.0 08/30/2016 1508   LDLCALC 65 06/02/2018 0000   LDLCALC 69 10/10/2017     RADIOLOGY: No results found.  IMPRESSION:  1. OSA (obstructive sleep apnea)   2. Coronary artery disease involving coronary bypass graft of native heart without angina pectoris   3. Hx of CABG   4. Ischemic cardiomyopathy   5. 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 ([place])   6. Hypotension, unspecified hypotension type   7. PAF (paroxysmal atrial fibrillation) ([place])   8. Chronic anticoagulation     ASSESSMENT AND PLAN: Mr. Thwaites is a 68 year old gentleman who has a history of morbid obesity, hypertension,  coronary artery disease who is status post stenting of his RCA in May 2011 with subsequent CABG surgery on Jul 20, 2010. In April 2013 he was found to have subtotal stenosis in a very large circumflex vessel and underwent successful insertion of a large Resolute DES stent. A nuclear perfusion study in May 2013  revealed an ejection fraction approximately 37%.   He has suffered a large right MCA CVA and had an implantable loop recorder to assess for potential atrial fibrillation.  He subsequently suffered bilateral submassive pulmonary emboli and has developed a cardiomyopathy with a drop in  ejection fraction to 25-30% noted at that time.   A follow-up CT revealed a chronic filling defect in the distal left vertebral artery compatible with soft plaque or thromboembolic disease.  He has soft calcified plaque in the right vertebral artery region.  He had improved right middle cerebral artery patency since November 2015 without intracranial arterial occlusion.  There was evidence for his chronic right MCA infarct without acute intracranial abnormality.  Subsequent to his stroke there was significant  purposeful weight loss of approximately 170 pounds.  He was diagnosed as having severe sleep apnea with an AHI of 36.1/h; however, on the diagnostic portion of the test he was not able to achieve any REM sleep.  He has been on CPAP therapy since February 28, 2017.  Due to machine malfunction he received a new machine several months ago.  Presently, he continues to be compliant with CPAP use with 100% usage stays and is now again averaging over 7 hours of sleep per night.  When he was last evaluated in a telemedicine evaluation for several months prior to that he was oftentimes not using CPAP after 3:30 in the morning.  At that time, I discussed that the preponderance of REM sleep occurs in the second half of the night and the importance of continue to use treatment through the nights entirety.  AHI with treatment is an excellent at 2.3 at 11 cm water pressure.  He does have occasional mask leak.  His blood pressure today is on the low side without significant orthostatic drop going from sitting to the standing position.  He was in a wheelchair at the start and therefore not on the patient examining bed.  He had previously been on midodrine 2.5 mg twice a day.  Apparently his dose was increased to 5 mg 3 times a day by neurology and this resulted in significant hypertension leading to his self discontinuance.  With his low blood pressure presently I have recommended resumption of the 2.5 twice daily dosing  regimen in light of his episodic dizziness if he tries to walk most likely resulting from progressive blood pressure lowering.  He had undergone follow-up carotid studies which had not shown significant change.  He is maintaining sinus rhythm without recurrent atrial fibrillation and continues to be on chronic anticoagulation.  I will see him in 6 months for reevaluation  Time spent: 25 minutes Gregory Beedle A.  Claiborne Billings, MD, [facility]  02/13/2019 1:09 PM

## 2019-02-13 ENCOUNTER — Encounter: Payer: Self-pay | Admitting: Cardiovascular Disease

## 2019-02-15 ENCOUNTER — Other Ambulatory Visit (INDEPENDENT_AMBULATORY_CARE_PROVIDER_SITE_OTHER): Payer: Medicare Other

## 2019-02-15 DIAGNOSIS — I959 Hypotension, unspecified: Secondary | ICD-10-CM | POA: Diagnosis not present

## 2019-02-15 DIAGNOSIS — I25810 Atherosclerosis of coronary artery bypass graft(s) without angina pectoris: Secondary | ICD-10-CM

## 2019-02-15 DIAGNOSIS — G4733 Obstructive sleep apnea (adult) (pediatric): Secondary | ICD-10-CM

## 2019-02-15 DIAGNOSIS — I255 Ischemic cardiomyopathy: Secondary | ICD-10-CM | POA: Diagnosis not present

## 2019-02-15 DIAGNOSIS — I63411 Cerebral infarction due to embolism of right middle cerebral artery: Secondary | ICD-10-CM

## 2019-02-15 DIAGNOSIS — I480 Paroxysmal atrial fibrillation: Secondary | ICD-10-CM

## 2019-02-15 DIAGNOSIS — I5042 Chronic combined systolic (congestive) and diastolic (congestive) heart failure: Secondary | ICD-10-CM

## 2019-02-25 ENCOUNTER — Other Ambulatory Visit: Payer: Self-pay | Admitting: Cardiovascular Disease

## 2019-03-01 ENCOUNTER — Telehealth: Payer: Self-pay | Admitting: Podiatry

## 2019-03-01 NOTE — Telephone Encounter (Signed)
Left voicemail for patient to call and schedule appt. Pt sent MyChart message requesting appt for pain/inflamtion in left great toe

## 2019-03-03 ENCOUNTER — Other Ambulatory Visit: Payer: Self-pay

## 2019-03-03 ENCOUNTER — Ambulatory Visit (INDEPENDENT_AMBULATORY_CARE_PROVIDER_SITE_OTHER): Payer: Medicare Other | Admitting: Podiatry

## 2019-03-03 ENCOUNTER — Other Ambulatory Visit: Payer: Self-pay | Admitting: Family Medicine

## 2019-03-03 DIAGNOSIS — B351 Tinea unguium: Secondary | ICD-10-CM

## 2019-03-03 DIAGNOSIS — M79676 Pain in unspecified toe(s): Secondary | ICD-10-CM

## 2019-03-03 DIAGNOSIS — L600 Ingrowing nail: Secondary | ICD-10-CM

## 2019-03-08 NOTE — Progress Notes (Signed)
Subjective: 68 y.o. male presenting today with a chief complaint of sharp pain to the medial border of the left hallux that began about one week ago. He reports associated redness and swelling. Touching the area increases the pain. He has not had any treatment for the symptoms.  He is also complaining of elongated, thickened nails that cause pain while ambulating in shoes. He is unable to trim his own nails. Patient is here for further evaluation and treatment.   Past Medical History:  Diagnosis Date  . Adrenal insufficiency ([place])   . Anxiety   . Basal cell carcinoma of skin of left ankle   . Bleeding stomach ulcer 2015  . Coronary artery disease    stent, then CABG 07/20/10  . Daily headache    "for the past month" (11/11/2014)  . Depression   . Diverticulosis   . GERD (gastroesophageal reflux disease)   . Gout   . H/O cardiomyopathy    ischemic, now last echo 07/30/11, EF 38%W  . History of hiatal hernia   . Hyperlipidemia   . Hypertension   . Internal hemorrhoids   . Left hemiplegia ([place])   . NSTEMI (non-ST elevated myocardial infarction) ([place])    NSTEMI- last cath 06/2011-Stent to LCX-DES, last nuc 07/30/11 low risk  . Obesity (BMI 30-39.9)   . OSA on CPAP    since July 2013- uses CPAP sometimes , states he has lost 147 lbs. since stroke & doesn't use the CPAP as much as he use to.   . Plantar fasciitis of left foot   . Pneumonia    hosp. 12/2014  . Pulmonary embolism ([place]) 03/2014  . Stroke ([place]) 01/2014   "no control of LUE, can slightly LLE; memory problems since" (11/11/2014)  . Tubular adenoma of colon     Objective:  General: Well developed, nourished, in no acute distress, alert and oriented x3   Dermatology: Skin is warm, dry and supple bilateral. Medial border of the left hallux appears to be erythematous with evidence of an ingrowing nail. Pain on palpation noted to the border of the nail fold. Nails are tender, long, thickened and dystrophic with subungual debris,  consistent with onychomycosis, 1-5 bilateral. There are no open sores, lesions.  Vascular: Dorsalis Pedis artery and Posterior Tibial artery pedal pulses palpable. No lower extremity edema noted.   Neruologic: Grossly intact via light touch bilateral.  Musculoskeletal: Muscular strength within normal limits in all groups bilateral. Normal range of motion noted to all pedal and ankle joints.   Assessment:  #1 Paronychia with ingrowing nail medial border left hallux  #2 Pain in toe #3 Incurvated nail #4 Onychodystrophic nails 1-5 bilateral with hyperkeratosis of nails.  #5 Onychomycosis of nail due to dermatophyte bilateral   Plan of Care:  1. Patient evaluated.  2. Discussed treatment alternatives and plan of care. Explained nail avulsion procedure and post procedure course to patient. 3. Patient opted for total temporary nail avulsion of the left great toenail.  4. Prior to procedure, local anesthesia infiltration utilized using 3 ml of a 50:50 mixture of 2% plain lidocaine and 0.5% plain marcaine in a normal hallux block fashion and a betadine prep performed.  5. Light dressing applied. 6. Mechanical debridement of nails 1-5 right, 2-5 left performed using a nail nipper. Filed with dremel without incident.  7. Return to clinic in 3 months.   Edrick Kins, DPM [facility]  Dr. Edrick Kins, DPM  [address]                                        [place], [place] [address]                Office [phone]  Fax [phone]

## 2019-03-15 ENCOUNTER — Ambulatory Visit: Payer: Medicare Other | Admitting: Family Medicine

## 2019-03-18 ENCOUNTER — Other Ambulatory Visit: Payer: Self-pay | Admitting: Cardiovascular Disease

## 2019-03-18 NOTE — Telephone Encounter (Signed)
Rx request sent to pharmacy.  

## 2019-03-29 ENCOUNTER — Other Ambulatory Visit: Payer: Self-pay | Admitting: *Deleted

## 2019-03-29 DIAGNOSIS — M21372 Foot drop, left foot: Secondary | ICD-10-CM

## 2019-04-01 ENCOUNTER — Telehealth: Payer: Self-pay

## 2019-04-01 ENCOUNTER — Ambulatory Visit: Payer: Medicare Other | Admitting: Physical Therapy

## 2019-04-01 NOTE — Telephone Encounter (Signed)
I tried to do PA on cover my meds. It stated pt was inactive.I called [facility] at  [phone]. I gave them pts name,demorgraphic information and insurance information. They stated pts coverage ended 03/17/2020. PT does not have active insurance to complete PA.

## 2019-04-01 NOTE — Telephone Encounter (Signed)
I called pt. Relayed below info. He is going to call insurance to make sure its active and call me back Monday with an update.

## 2019-04-05 ENCOUNTER — Ambulatory Visit: Payer: Medicare Other | Attending: Neurology | Admitting: Physical Therapy

## 2019-04-05 ENCOUNTER — Encounter: Payer: Self-pay | Admitting: Physical Therapy

## 2019-04-05 ENCOUNTER — Other Ambulatory Visit: Payer: Self-pay

## 2019-04-05 VITALS — BP 133/81 | HR 111

## 2019-04-05 DIAGNOSIS — R2689 Other abnormalities of gait and mobility: Secondary | ICD-10-CM | POA: Diagnosis not present

## 2019-04-05 DIAGNOSIS — R296 Repeated falls: Secondary | ICD-10-CM

## 2019-04-05 DIAGNOSIS — I69354 Hemiplegia and hemiparesis following cerebral infarction affecting left non-dominant side: Secondary | ICD-10-CM

## 2019-04-05 DIAGNOSIS — M6281 Muscle weakness (generalized): Secondary | ICD-10-CM | POA: Diagnosis present

## 2019-04-05 DIAGNOSIS — R293 Abnormal posture: Secondary | ICD-10-CM | POA: Diagnosis present

## 2019-04-05 DIAGNOSIS — M25672 Stiffness of left ankle, not elsewhere classified: Secondary | ICD-10-CM | POA: Diagnosis present

## 2019-04-05 DIAGNOSIS — R2681 Unsteadiness on feet: Secondary | ICD-10-CM | POA: Diagnosis present

## 2019-04-05 DIAGNOSIS — M21372 Foot drop, left foot: Secondary | ICD-10-CM | POA: Diagnosis present

## 2019-04-05 NOTE — Therapy (Signed)
[facility] [address] [place], [place], [address] Phone: [phone]   Fax:  [phone]  Physical Therapy Evaluation  Patient Details  Name: Gregory Wiley MRN: [id number] Date of Birth: 12/11/1950 Referring Provider (PT): Felecia Shelling, Nanine Means, MD   Encounter Date: 04/05/2019  PT End of Session - 04/05/19 1427    Visit Number  1    Number of Visits  25    Date for PT Re-Evaluation  07/02/19    Authorization Type  [employer] Medicare - 10th visit PN    PT Start Time  1315    PT Stop Time  1425    PT Time Calculation (min)  70 min    Equipment Utilized During Treatment  Gait belt;Other (comment)   bioness   Activity Tolerance  Patient tolerated treatment well;Patient limited by fatigue    Behavior During Therapy  [facility] for tasks assessed/performed       Past Medical History:  Diagnosis Date  . Adrenal insufficiency ([place])   . Anxiety   . Basal cell carcinoma of skin of left ankle   . Bleeding stomach ulcer 2015  . Coronary artery disease    stent, then CABG 07/20/10  . Daily headache    "for the past month" (11/11/2014)  . Depression   . Diverticulosis   . GERD (gastroesophageal reflux disease)   . Gout   . H/O cardiomyopathy    ischemic, now last echo 07/30/11, EF 38%W  . History of hiatal hernia   . Hyperlipidemia   . Hypertension   . Internal hemorrhoids   . Left hemiplegia ([place])   . NSTEMI (non-ST elevated myocardial infarction) ([place])    NSTEMI- last cath 06/2011-Stent to LCX-DES, last nuc 07/30/11 low risk  . Obesity (BMI 30-39.9)   . OSA on CPAP    since July 2013- uses CPAP sometimes , states he has lost 147 lbs. since stroke & doesn't use the CPAP as much as he use to.   . Plantar fasciitis of left foot   . Pneumonia    hosp. 12/2014  . Pulmonary embolism ([place]) 03/2014  . Stroke ([place]) 01/2014   "no control of LUE, can slightly LLE; memory problems since" (11/11/2014)  . Tubular adenoma of colon     Past  Surgical History:  Procedure Laterality Date  . BASAL CELL CARCINOMA EXCISION Left 2015   ankle  . CARDIAC SURGERY    . CATARACT EXTRACTION Bilateral   . COLONOSCOPY N/A 02/10/2013   Procedure: COLONOSCOPY;  Surgeon: Ladene Artist, MD;  Location: [facility];  Service: Endoscopy;  Laterality: N/A;  . CORONARY ANGIOPLASTY WITH STENT PLACEMENT  07/01/2011   DES-Resolute to native LCX  . CORONARY ANGIOPLASTY WITH STENT PLACEMENT  12/01/2007   COMPLEX 5 LESION PCI INCLUDING CUTTING BALLOON AND 4 CYPHER DESs  . CORONARY ARTERY BYPASS GRAFT  07/20/2010    LIMA-LAD; VG-ACUTE MARG of RCA; Seq VG-distal RCA & then pda  . EP IMPLANTABLE DEVICE N/A 02/14/2016   Procedure: Loop Recorder Removal;  Surgeon: Thompson Grayer, MD;  Location: [place] [facility] CV LAB;  Service: Cardiovascular;  Laterality: N/A;  . ESOPHAGOGASTRODUODENOSCOPY  01/2014   gastric ulcer, erosive gastroduodenitis, H Pylori negative.   . ESOPHAGOGASTRODUODENOSCOPY (EGD) WITH PROPOFOL N/A 11/14/2014   Procedure: ESOPHAGOGASTRODUODENOSCOPY (EGD) WITH PROPOFOL;  Surgeon: Milus Banister, MD;  Location: [facility];  Service: Endoscopy;  Laterality: N/A;  . HAND SURGERY Right    to take glass out  . HERNIA REPAIR    .  INTRAMEDULLARY (IM) NAIL INTERTROCHANTERIC Right 02/07/2015   Procedure: INTRAMEDULLARY (IM) NAIL INTERTROCHANTRIC RIGHT HIP;  Surgeon: Renette Butters, MD;  Location: [facility];  Service: Orthopedics;  Laterality: Right;  . KNEE ARTHROSCOPY Right   . LEFT HEART CATHETERIZATION WITH CORONARY/GRAFT ANGIOGRAM  07/01/2011   Procedure: LEFT HEART CATHETERIZATION WITH Beatrix Fetters;  Surgeon: Sanda Klein, MD;  Location: [facility] CATH LAB;  Service: Cardiovascular;;  . LOOP RECORDER IMPLANT  02/15/2014   MDT LINQ implanted by Dr Rayann Heman for cryptogenic stroke  . PERCUTANEOUS CORONARY STENT INTERVENTION (PCI-S) Right 07/01/2011   Procedure: PERCUTANEOUS CORONARY STENT INTERVENTION (PCI-S);  Surgeon: Sanda Klein, MD;  Location: [facility]  CATH LAB;  Service: Cardiovascular;  Laterality: Right;  . PICC LINE PLACE PERIPHERAL ([place] HX)  06/2015  . RETINAL DETACHMENT SURGERY Right   . TEE WITHOUT CARDIOVERSION N/A 02/15/2014   Procedure: TRANSESOPHAGEAL ECHOCARDIOGRAM (TEE);  Surgeon: Josue Hector, MD;  Location: [facility];  Service: Cardiovascular;  Laterality: N/A;  . UMBILICAL HERNIA REPAIR  2006    Vitals:   04/05/19 1341 04/05/19 1657 04/05/19 1658  BP: 115/78 118/87 133/81  Pulse: 88 (!) 111 (!) 111     Subjective Assessment - 04/05/19 1324    Subjective  Patient is s/p CVA on 02/12/2014. He received PT in 2016 to address functional deficits and limitations and progressed to ambulating with a cane for short/limited community distances. At the end of 2019 he began having difficulty with ambulation and balance that has progressively declined. He currently utilized a manual w/c for all ambulation and uses a quad cane for transfers. At home, he typically stays in a chair most of the day. He received an orthosis after the CVA but doesn't wear it - he didn't feel like the brace was helping. Recently had a fall on 03/26/2019 d/t feeling light headed, his L foot crossing behind his right foot and lost his balance and requested an evaluation for bioness foot drop brace. He states he is DNR.    Patient is accompained by:  Family member   wife   Pertinent History  DNR; anxiety & depression, CAD with CABG, diverticulosis, gout, HLD, HTN, left hemiplegia, NSTEMI (2013), morbid obesity, PE (2016), CVA (2015), tubular adenoma of colon, OSA w/CPAP, right hip fx w/ORIF (2016), iron deficiency anemia    Limitations  Standing;Walking    Patient Stated Goals  to obtain a Bioness for home use; to return to PLOF of ambulating household distances and short community distances with quad cane    Currently in Pain?  No/denies         [facility] PT Assessment - 04/05/19 1330      Assessment   Medical Diagnosis  Left Foot Drop    Referring Provider  (PT)  Sater, Nanine Means, MD    Onset Date/Surgical Date  70/11/21   referral date   Next MD Visit  in April     Prior Therapy  yes in 2016 s/p CVA 2015      Precautions   Precautions  Fall    Precaution Comments  DNR; anxiety & depression, CAD with CABG, diverticulosis, gout, HLD, HTN, left hemiplegia, NSTEMI (2013), morbid obesity, PE (2016), CVA (2015), tubular adenoma of colon, OSA w/CPAP, right hip fx w/ORIF (2016), iron deficiency anemia      Restrictions   Weight Bearing Restrictions  No      Balance Screen   Has the patient fallen in the past 6 months  Yes    How many  times?  1   got light headed and lost balance    Has the patient had a decrease in activity level because of a fear of falling?   Yes    Is the patient reluctant to leave their home because of a fear of falling?   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[employer], play guitar       Cognition   Overall Cognitive Status  Within Functional Limits for tasks assessed      Observation/Other Assessments   Observations  signficant swelling & dry skin of RLE - discussed use of compression socks with pt and spouse to reduce edema       Sensation   Additional Comments  reports R foot feels numb however sensation intact throughout R lower leg      Coordination   Gross Motor Movements are Fluid and Coordinated  Not tested    Fine Motor  Movements are Fluid and Coordinated  Not tested      Posture/Postural Control   Posture/Postural Control  Postural limitations    Postural Limitations  Rounded Shoulders;Forward head;Weight shift right      Tone   Assessment Location  Left Lower Extremity      ROM / Strength   AROM / PROM / Strength  Strength;PROM      PROM   Overall PROM   Deficits    Overall PROM Comments  L ankle DF lacks 24 deg to neutral DF with knee extended and lacks 10 deg with knee flexed   significant tightness in gastroc-soleus complex     Strength   Overall Strength  Deficits    Overall Strength Comments  could not perform movements with LLE without RLE assist; extended L knee by way of heel slide     Strength Assessment Site  Hip;Knee;Ankle    Right/Left Hip  Right    Right Hip Flexion  3+/5    Right/Left Knee  Right    Right Knee Flexion  4/5    Right Knee Extension  5/5    Right/Left Ankle  Right    Right Ankle Dorsiflexion  4+/5      Transfers   Transfers  Sit to Stand;Stand to Sit    Sit to Stand  4: Min assist    Sit to Stand Details  Manual facilitation for weight bearing    Sit to Stand Details (indicate cue type and reason)  therapist applied pressure at distal thigh to facilitate extension to stand and stabilize the L knee into hip ABD and Internal rotation to prepare for standing    Stand to Sit  4: Min assist    Stand to Sit Details  minA for safety       Ambulation/Gait  Ambulation/Gait  Yes    Ambulation/Gait Assistance  2: Max assist   minA for safety by SPT, PT maxA for limb advancement   Ambulation/Gait Assistance Details  Therapist provided stability to L knee in stance and faciliated L knee/hip flexion for limb advancement, assist to prevent significant L adduction    Ambulation Distance (Feet)  9 Feet   limited by fatigue    Assistive device  Large base quad cane    Gait Pattern  Step-to pattern;Decreased step length - right;Decreased stance time - left;Decreased stride  length;Decreased hip/knee flexion - right;Decreased hip/knee flexion - left;Decreased dorsiflexion - right;Decreased dorsiflexion - left;Decreased weight shift to left;Lateral hip instability;Wide base of support;Trunk flexed;Poor foot clearance - left   L foot supination, LLE significant adduction    Ambulation Surface  Indoor;Level    Gait Comments  Attempted TUGnormal, pt had to take a seated rest break after 9 ft. Ambulated 9 ft. in 74.59 seconds indicating predicted gait speed to be 0.12 ft/sec       LLE Tone   LLE Tone  Moderate;Hypertonic      LLE Tone   Hypertonic Details  During WB pt demonstrates increased knee extension, hip adduction, hip ER, ankle PF and supination hypertonicity                Objective measurements completed on examination: See above findings.      [place] Adult PT Treatment/Exercise - 04/05/19 1330      Therapeutic Activites    Therapeutic Activities  Other Therapeutic Activities    Other Therapeutic Activities  PT discussed pros & cons of using bioness. PT educated pt and spouse on what bioness is, work and activity tolerance required to learn how to use and set up bioness - how to manage the unit, financial cost & insurance. Screened pt for precautions of bioness - negative. Educated pt on factors the may impact effectiveness of bioness conduction such as dry skin (advised pt to use lotion night prior and wipe off in the morning prior to PT) and swelling (advised pt to wear compression stocking and elevate LLE in the morning prior to PT). After attempting to use bioness, PT assessed skin integrity - pt presented with some redness. Advised pt & spouse to monitor and report findings if it does not go away or develops into a rash in order to gauge skin tolerance to bioness tx      Modalities   Modalities  Electrical Stimulation      Electrical Stimulation   Electrical Stimulation Location  L anterior tibialis & peroneals     Electrical Stimulation  Action  Slight DF with increased inversion - using steering electrodes but unable to elicit ankle eversion; DF activation faded quickly and pt unable to tolerate higher intensities    Electrical Stimulation Parameters  See Tablet 2; steering electrode    Electrical Stimulation Goals  Neuromuscular facilitation             PT Education - 04/05/19 1427    Education Details  PT role, POC, see TA    Person(s) Educated  Patient;Spouse    Methods  Explanation    Comprehension  Verbalized understanding       PT Short Term Goals - 04/05/19 1434      PT SHORT TERM GOAL #1   Title  Patient and wife demonstrate independence with initial LE stretching HEP. (all STGs target date: 05/05/2019 - after 4 weeks reset STG to 8 weeks)  Time  4    Period  Weeks    Status  New    Target Date  05/05/19      PT SHORT TERM GOAL #2   Title  Patient participates in TUG assessment.    Time  4    Period  Weeks    Status  New    Target Date  05/05/19      PT SHORT TERM GOAL #3   Title  --    Baseline  Pt tolerates use of Bioness unit for transfer training with quad cane and min A    Time  4    Period  Weeks    Status  New    Target Date  05/05/19      PT SHORT TERM GOAL #4   Title  Patient tolerates wear of bioness unit for TUG and gait velocity assessment with Min A    Time  4    Period  Weeks    Status  New    Target Date  05/05/19      PT SHORT TERM GOAL #5   Title  Pt will improve gait velocity with quad cane and most appropriate orthosis to >/=0.5 ft/sec    Baseline  .72ft/sec with quad cane    Time  4    Period  Weeks    Status  New    Target Date  05/05/19        PT Long Term Goals - 04/05/19 1434      PT LONG TERM GOAL #1   Title  Patient and wife demonstrate and verbalizes independence with advanced HEP. (all LTGs target date: 07/02/2019)    Time  12    Period  Weeks    Status  New    Target Date  07/02/19      PT LONG TERM GOAL #2   Title  Patient improves TUG with  quad cane by at least 3 seconds to demonstrate clinically significant improvement and reduction of fall risk.    Baseline  TBD    Time  12    Period  Weeks    Status  New    Target Date  07/02/19      PT LONG TERM GOAL #3   Title  Patient will improve gait velocity with quad cane and most appropriate orthosis (bioness or AFO) to >1.0 ft/sec    Baseline  .12 ft/sec    Time  12    Period  Weeks    Status  New    Target Date  07/02/19      PT LONG TERM GOAL #4   Title  Patient demonstrates improved L passive ankle DF by at least 10 degrees.    Baseline  lacking 24 deg with knee extended, lacking 10 deg with knee flexed    Time  12    Period  Weeks    Status  New    Target Date  07/02/19      PT LONG TERM GOAL #5   Title  Pt will demonstrate ability to transfer w/c <> mat/chair with quad cane, most appropriate orthosis and supervision    Time  12    Period  Weeks    Status  New    Target Date  07/02/19             Plan - 04/05/19 1429    Clinical Impression Statement  Patient is a 69 yo male presenting to OPPT for left  foot drop referred by Britt Bottom, MD on 03/29/2019 for a bioness foot drop brace evaluation. Patient requested on 03/27/2019 due to fall on 03/26/2019. He is s/p CVA 02/12/2014. His PMH is significant for: anxiety & depression, CAD, diverticulosis, gout, HLD, HTN, left hemiplegia, NSTEMI (2013), morbid obesity, PE (2016), CVA (2015), tubular adenoma of colon, OSA w/CPAP, right hip fx w/ORIF (2016), iron deficiency anemia. PT examination revealed deficits in LE hypertonicity,  P/AROM, strength, gait abnormality, functional endurance, postural control and balance. Attempted to perform a TUG, however pt limited by fatigue and had to take a seated rest break after 9 ft. (74.59s) - he required assist for L knee stance stability and limb advancement. Attempted bioness stimulation and was unable to get a consistent response - educated pt on factors that could impact  conduction. Will assess bioness response further. Pt is considered a significant fall risk at this time. Pt may benefit from skilled therapy services to address functional deficits and improve household mobility and safety.    Personal Factors and Comorbidities  Age;Fitness;Past/Current Experience;Comorbidity 3+;Time since onset of injury/illness/exacerbation    Comorbidities  anixety & depression, CAD, diverticulosis, gout, HLD, HTN, left hemiplegia, NSTEMI (2013), morbid obesity, PE (2016), CVA (2015), tubular adenoma of colon, OSA w/CPAP, right hip fx w/ORIF (2016), iron deficiency anemia    Examination-Activity Limitations  Squat;Stairs;Stand;Dressing;Transfers;Bed Mobility;Locomotion Level    Examination-Participation Restrictions  Church;Community Activity    Stability/Clinical Decision Making  Evolving/Moderate complexity    Clinical Decision Making  Moderate    Rehab Potential  Good    PT Frequency  2x / week    PT Duration  12 weeks    PT Treatment/Interventions  ADLs/Self Care Home Management;Electrical Stimulation;DME Instruction;Balance training;Therapeutic exercise;Orthotic Fit/Training;Gait training;Neuromuscular re-education;Stair training;Functional mobility training;Patient/family education;Therapeutic activities;Manual techniques;Compression bandaging;Passive range of motion    PT Next Visit Plan  **DNR** assess vitals throughout session. continue bioness assessment - if able to get contraction trial thigh unit; fill out paperwork if appropriate.  further balance assessment - TUG when appropriate; assess transfers, initiate LLE stretching, WB and standing LLE    Consulted and Agree with Plan of Care  Patient;Family member/caregiver    Family Member Consulted  spouse       Patient will benefit from skilled therapeutic intervention in order to improve the following deficits and impairments:  Abnormal gait, Decreased range of motion, Difficulty walking, Impaired tone, Obesity,  Impaired UE functional use, Decreased endurance, Decreased activity tolerance, Decreased skin integrity, Decreased balance, Decreased knowledge of use of DME, Impaired flexibility, Postural dysfunction, Impaired sensation, Increased edema, Decreased strength, Decreased mobility  Visit Diagnosis: Other abnormalities of gait and mobility  Muscle weakness (generalized)  Unsteadiness on feet  Stiffness of left ankle, not elsewhere classified  Abnormal posture  Repeated falls  Foot drop, left  Spastic hemiplegia of left nondominant side as late effect of cerebral infarction [facility])     Problem List Patient Active Problem List   Diagnosis Date Noted  . Tinea cruris 07/20/2017  . Hemorrhoid 07/20/2017  . Abnormal CT scan 06/08/2017  . Irritability 06/08/2017  . Fall at home 05/06/2017  . Advance care planning 11/28/2016  . Skin mass 11/28/2016  . Lower urinary tract symptoms (LUTS) 09/05/2016  . Hearing loss 09/05/2016  . Excessive daytime sleepiness 04/16/2016  . Exposure to TB 02/21/2016  . Vitamin D deficiency 02/21/2016  . Anemia 11/10/2015  . Left thigh pain 11/10/2015  . Neck pain 10/12/2015  . Hemiplegia affecting left side in left-dominant patient  as late effect of cerebrovascular disease ([place]) 09/03/2015  . Medicare annual wellness visit, subsequent 08/14/2015  . Acute pulmonary embolism ([place]) 07/25/2015  . Chest wall pain 07/15/2015  . History of pulmonary embolism 07/15/2015  . Chronic anticoagulation 05/22/2015  . Gait disturbance 05/15/2015  . DNR (do not resuscitate) 01/19/2015  . Hypotension 01/14/2015  . Depression 12/14/2014  . OSA (obstructive sleep apnea) 11/30/2014  . Chronic combined systolic and diastolic congestive heart failure ([place]) 11/11/2014  . SOB (shortness of breath) 04/17/2014  . Hemiplegia affecting left dominant side ([place]) 02/16/2014  . 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  06/30/2011    Juliann Pulse  SPT 04/06/2019, 5:32 PM  [facility] [address] [place], [place], [address] Phone: [phone]   Fax:  [phone]  Name: LUISFERNANDO REMACHE MRN: [id number] Date of Birth: 1950/11/02

## 2019-04-05 NOTE — Telephone Encounter (Signed)
Called pt. He went to rehab today and they scanned in new insurance cards for this year. Advised I will try to submit PA with this info. I will call back if there are any issues. He verbalized understanding.

## 2019-04-05 NOTE — Telephone Encounter (Signed)
Submitted PA armodafinil 200mg  tablet on CMM. [id number] - PA Case ID: [id number]. Waiting on determination from [employer].

## 2019-04-06 NOTE — Telephone Encounter (Signed)
Noted, I will try to complete PA with this new info.

## 2019-04-06 NOTE — Telephone Encounter (Signed)
Roessner,Laura(wife on DPR) has called to inform Terrence Dupont, RN that there is a separate benefit card [facility] [phone] RX PCN#[id number] RX GRP#[id number] Customer Care#[phone] Please call wife

## 2019-04-06 NOTE — Patient Instructions (Signed)
BIONESS CONTRAINDICATIONS/PRECAUTIONS REVIEWED WITH THE PATIENT: o CONTRAINDICATIONS *requires physician approval*:   1) Patients with demand-type cardiac pacemaker, defibrillator or any electrical implant (ex, insulin pump). 2) Patients where a cancerous lesion is present or suspected in quadrant to be stimulated. 3) Patient with a regional disorder, such as a fracture or dislocation, which could be adversely affected by motion from the stimulator.   4) Patient where a metallic implant is directly underneath the electrodes. 5) Safe use of L 300Go system during pregnancy has not been established. o PRECAUTIONS *recommend physician approval*:   1) Inflammation in the region of the lower leg that may be aggravated by motion, muscle activity, or pressure from the cuff.   2) Suspected or diagnosed heart problem or epilepsy/history of seizures.   3) Use Bioness with caution if you have a tendency to bleed heavily following trauma or fracture, following recent surgical procedures where muscle contraction may disrupt healing, or over areas of skin that lack normal sensation.  4) Persistent redness, lesions, or blisters lasting > 1 hour are signs of infection, irritation.  Stop using the L300 Go until inflammation is gone. 5) Use caution with cognitive impairments if a patient is unable to accurately report feedback.

## 2019-04-06 NOTE — Telephone Encounter (Signed)
Received fax notification from [employer] that PA denied. They do not handle outpt prescription drugs. Only part B drugs under medical plan. I called [facility] to find out if they had pharmacy benefit on file. They have: [employer] ID: [id number]. Bin: [id number]. PCN: [id number]. Grp: [id number]. Phone number: [phone]. She did inform me that he has not filled with them since August. I tried submitting new PA on CMM with this info but states pt inactive. Key: [id number].   Called, LVM for pt to call office to let us know if he has a separate pharmacy benefit card.

## 2019-04-08 ENCOUNTER — Other Ambulatory Visit: Payer: Self-pay | Admitting: Neurology

## 2019-04-08 ENCOUNTER — Other Ambulatory Visit: Payer: Self-pay | Admitting: Family Medicine

## 2019-04-08 NOTE — Telephone Encounter (Signed)
I called [facility] and spoke with Caryl Pina. Submitted PA over the phone. OSA (G47.33). PA approved 04/08/19-04/07/20. [id number].   I called wife back to let her know PA approved, I had to LVM relaying above information.

## 2019-04-08 NOTE — Telephone Encounter (Signed)
??   Regarding Midodrine: Refill directions say pt is taking 1 tab daily but last Rx said pt was taking TID, also it looks like last refill came from neuro, please advise  Also zoloft was filled last on 09/11/18 #90 tabs with 3 refills but this refill request is from a new pharmacy, last sent to [facility] (filled by Dr. Damita Dunnings last)

## 2019-04-08 NOTE — Telephone Encounter (Addendum)
I sent the prescription for sertraline.  I routed the refill for midodrine to neurology, with appreciation.  Thanks.

## 2019-04-15 ENCOUNTER — Ambulatory Visit: Payer: Medicare Other | Admitting: Physical Therapy

## 2019-04-16 ENCOUNTER — Ambulatory Visit: Payer: Medicare Other | Admitting: Physical Therapy

## 2019-04-23 ENCOUNTER — Ambulatory Visit: Payer: Medicare Other | Admitting: Physical Therapy

## 2019-04-28 ENCOUNTER — Ambulatory Visit: Payer: Medicare Other | Admitting: Physical Therapy

## 2019-05-05 ENCOUNTER — Telehealth: Payer: Self-pay | Admitting: Neurology

## 2019-05-05 ENCOUNTER — Telehealth: Payer: Self-pay | Admitting: Cardiovascular Disease

## 2019-05-05 ENCOUNTER — Other Ambulatory Visit: Payer: Self-pay | Admitting: Family Medicine

## 2019-05-05 ENCOUNTER — Other Ambulatory Visit: Payer: Self-pay | Admitting: Neurology

## 2019-05-05 ENCOUNTER — Ambulatory Visit: Payer: Medicare Other | Admitting: Physical Therapy

## 2019-05-05 MED ORDER — ARMODAFINIL 200 MG PO TABS: ORAL_TABLET | ORAL | 5 refills | Status: DC

## 2019-05-05 NOTE — Telephone Encounter (Signed)
New message   Patient's wife states that the patient needs pre authorization for PROTONIX 40 MG tablet. Please call to discuss.

## 2019-05-05 NOTE — Telephone Encounter (Signed)
1) Medication(s) Requested (by name): armadofinil  2) Pharmacy [facility]  [facility] [address] 3) Special Requests:  Pharmacy is requesting a PA for the patients medication   [id number] RX PCN#[id number] RX GRP#[id number] Customer [phone]

## 2019-05-05 NOTE — Telephone Encounter (Signed)
I called wife who states pharmacy needing PA for armodafinil. Advised this was approved last month. [facility] and [facility] Hail. Confirmed they have approved PA on file but refills needed. Advised I will send to MD to esribe. I called wife back and let her know. She verbalized understanding.

## 2019-05-05 NOTE — Telephone Encounter (Signed)
Patient needs a follow up appointment please

## 2019-05-05 NOTE — Telephone Encounter (Signed)
Returned call to patient's wife she stated husband's insurance is requiring a prior authorization for protonix.Advised I will send message to Dr.Kelly's RN.

## 2019-05-07 ENCOUNTER — Ambulatory Visit: Payer: Medicare Other | Admitting: Physical Therapy

## 2019-05-07 NOTE — Telephone Encounter (Signed)
Spoke with Gregory Wiley from [place] to clarify why pt needs PA for protonix. Per Gregory Wiley, there is a quantity limit and a yearly limit to the medication. Insurance will only cover 1 pill a day for protonix and pt is prescribed to have 2 daily.  PA # [id number]

## 2019-05-10 ENCOUNTER — Ambulatory Visit: Payer: Medicare Other | Admitting: Physical Therapy

## 2019-05-11 NOTE — Telephone Encounter (Signed)
Patient's wife calling to check on status of call from last week, as she has not heard back.

## 2019-05-12 ENCOUNTER — Ambulatory Visit: Payer: Medicare Other | Admitting: Physical Therapy

## 2019-05-13 NOTE — Telephone Encounter (Signed)
Spoke with pt's wife per DPR and notified that PA has been placed and we are just waiting on their response back. Wife states that her husband is complaining of his heartburn. I apologized for the inconvenience and notified I would let them know when a response is received.  Wife verbalized understanding.

## 2019-05-13 NOTE — Telephone Encounter (Signed)
Request Reference Number: [id number]. PROTONIX TAB 40MG  is denied for not meeting the prior authorization requirement.

## 2019-05-13 NOTE — Telephone Encounter (Signed)
Spoke with pt's wife and notified that PA was denied. Told pt that they may consider contacting their primary care doctor to see if they would be more successful in getting the medication. Told wife I would discuss with Dr.Kelly for next steps. Wife asked if we would be willing to speak with PCP regarding this. Told wife that if we were unable to get medication or an alternative that I would reach out to PCP. Wife agreeable with no other questions or concerns.

## 2019-05-17 ENCOUNTER — Ambulatory Visit: Payer: Medicare Other | Admitting: Physical Therapy

## 2019-05-18 ENCOUNTER — Telehealth: Payer: Self-pay

## 2019-05-18 ENCOUNTER — Ambulatory Visit: Payer: Medicare Other | Admitting: Family Medicine

## 2019-05-18 MED ORDER — PANTOPRAZOLE SODIUM 40 MG PO TBEC: 40.0000 mg | DELAYED_RELEASE_TABLET | Freq: Every day | ORAL | 3 refills | Status: DC

## 2019-05-18 NOTE — Telephone Encounter (Signed)
Spoke with pt wife per DPR. Notified that since the protonix 40mg  bid was denied, Dr.Kelly suggested we decrease to Protonix 40mg  daily and see how this works. Notified that if he is still having any issues to notify our office and we can reevaluate. Wife agreeable and verbalized understanding.  Order for protonix 40mg  daily sent to [facility] on [address].

## 2019-05-19 ENCOUNTER — Ambulatory Visit: Payer: Medicare Other | Admitting: Physical Therapy

## 2019-05-19 NOTE — Telephone Encounter (Signed)
See telephone note--Spoke with pt wife per DPR. Notified that since the protonix 40mg  bid was denied, Dr.Kelly suggested we decrease to Protonix 40mg  daily and see how this works. Notified that if he is still having any issues to notify our office and we can reevaluate. Wife agreeable and verbalized understanding.  Order for protonix 40mg  daily [facility] to [facility] [address]

## 2019-05-20 ENCOUNTER — Ambulatory Visit: Payer: Medicare Other | Admitting: Family Medicine

## 2019-05-24 ENCOUNTER — Ambulatory Visit: Payer: Medicare Other | Admitting: Physical Therapy

## 2019-05-25 ENCOUNTER — Encounter: Payer: Self-pay | Admitting: Podiatry

## 2019-05-26 ENCOUNTER — Ambulatory Visit: Payer: Medicare Other | Admitting: Physical Therapy

## 2019-05-31 ENCOUNTER — Ambulatory Visit: Payer: Medicare Other | Admitting: Physical Therapy

## 2019-06-01 MED ORDER — MIDODRINE HCL 2.5 MG PO TABS: 2.5000 mg | ORAL_TABLET | Freq: Two times a day (BID) | ORAL | 3 refills | Status: DC

## 2019-06-02 ENCOUNTER — Ambulatory Visit: Payer: Medicare Other | Admitting: Podiatry

## 2019-06-02 NOTE — Telephone Encounter (Signed)
Refill called to pharmacy since message came back stating that the electronic refill failed.

## 2019-06-02 NOTE — Telephone Encounter (Signed)
Called and got patient rescheduled for follow up.

## 2019-06-03 ENCOUNTER — Encounter: Payer: Self-pay | Admitting: Physical Therapy

## 2019-06-03 ENCOUNTER — Ambulatory Visit: Payer: Medicare Other | Attending: Neurology | Admitting: Physical Therapy

## 2019-06-03 VITALS — BP 121/75 | HR 69

## 2019-06-03 DIAGNOSIS — M6281 Muscle weakness (generalized): Secondary | ICD-10-CM | POA: Diagnosis present

## 2019-06-03 DIAGNOSIS — I69354 Hemiplegia and hemiparesis following cerebral infarction affecting left non-dominant side: Secondary | ICD-10-CM

## 2019-06-03 DIAGNOSIS — R293 Abnormal posture: Secondary | ICD-10-CM

## 2019-06-03 DIAGNOSIS — M21372 Foot drop, left foot: Secondary | ICD-10-CM | POA: Diagnosis present

## 2019-06-03 DIAGNOSIS — M25672 Stiffness of left ankle, not elsewhere classified: Secondary | ICD-10-CM | POA: Diagnosis present

## 2019-06-03 DIAGNOSIS — R2681 Unsteadiness on feet: Secondary | ICD-10-CM | POA: Diagnosis present

## 2019-06-03 DIAGNOSIS — R2689 Other abnormalities of gait and mobility: Secondary | ICD-10-CM | POA: Diagnosis not present

## 2019-06-03 DIAGNOSIS — R296 Repeated falls: Secondary | ICD-10-CM | POA: Diagnosis present

## 2019-06-03 NOTE — Therapy (Addendum)
[facility] [address] [place], [place], [address] Phone: [phone]   Fax:  [phone]  Physical Therapy Treatment  Patient Details  Name: Gregory Wiley MRN: [id number] Date of Birth: Apr 23, 1950 Referring Provider (PT): Felecia Shelling, Nanine Means, MD   Encounter Date: 06/03/2019  PT End of Session - 06/03/19 2052    Visit Number  2    Number of Visits  25    Date for PT Re-Evaluation  07/02/19    Authorization Type  [employer] Medicare - 10th visit PN    PT Start Time  1454    PT Stop Time  1535    PT Time Calculation (min)  41 min    Equipment Utilized During Treatment  Gait belt;Other (comment)   bioness   Activity Tolerance  Patient limited by fatigue    Behavior During Therapy  [facility] for tasks assessed/performed       Past Medical History:  Diagnosis Date  . Adrenal insufficiency ([place])   . Anxiety   . Basal cell carcinoma of skin of left ankle   . Bleeding stomach ulcer 2015  . Coronary artery disease    stent, then CABG 07/20/10  . Daily headache    "for the past month" (11/11/2014)  . Depression   . Diverticulosis   . GERD (gastroesophageal reflux disease)   . Gout   . H/O cardiomyopathy    ischemic, now last echo 07/30/11, EF 38%W  . History of hiatal hernia   . Hyperlipidemia   . Hypertension   . Internal hemorrhoids   . Left hemiplegia ([place])   . NSTEMI (non-ST elevated myocardial infarction) ([place])    NSTEMI- last cath 06/2011-Stent to LCX-DES, last nuc 07/30/11 low risk  . Obesity (BMI 30-39.9)   . OSA on CPAP    since July 2013- uses CPAP sometimes , states he has lost 147 lbs. since stroke & doesn't use the CPAP as much as he use to.   . Plantar fasciitis of left foot   . Pneumonia    hosp. 12/2014  . Pulmonary embolism ([place]) 03/2014  . Stroke ([place]) 01/2014   "no control of LUE, can slightly LLE; memory problems since" (11/11/2014)  . Tubular adenoma of colon     Past Surgical History:  Procedure  Laterality Date  . BASAL CELL CARCINOMA EXCISION Left 2015   ankle  . CARDIAC SURGERY    . CATARACT EXTRACTION Bilateral   . COLONOSCOPY N/A 02/10/2013   Procedure: COLONOSCOPY;  Surgeon: Ladene Artist, MD;  Location: [facility] ENDOSCOPY;  Service: Endoscopy;  Laterality: N/A;  . CORONARY ANGIOPLASTY WITH STENT PLACEMENT  07/01/2011   DES-Resolute to native LCX  . CORONARY ANGIOPLASTY WITH STENT PLACEMENT  12/01/2007   COMPLEX 5 LESION PCI INCLUDING CUTTING BALLOON AND 4 CYPHER DESs  . CORONARY ARTERY BYPASS GRAFT  07/20/2010    LIMA-LAD; VG-ACUTE MARG of RCA; Seq VG-distal RCA & then pda  . EP IMPLANTABLE DEVICE N/A 02/14/2016   Procedure: Loop Recorder Removal;  Surgeon: Thompson Grayer, MD;  Location: [facility] CV LAB;  Service: Cardiovascular;  Laterality: N/A;  . ESOPHAGOGASTRODUODENOSCOPY  01/2014   gastric ulcer, erosive gastroduodenitis, H Pylori negative.   . ESOPHAGOGASTRODUODENOSCOPY (EGD) WITH PROPOFOL N/A 11/14/2014   Procedure: ESOPHAGOGASTRODUODENOSCOPY (EGD) WITH PROPOFOL;  Surgeon: Milus Banister, MD;  Location: [facility];  Service: Endoscopy;  Laterality: N/A;  . HAND SURGERY Right    to take glass out  . HERNIA REPAIR    .  INTRAMEDULLARY (IM) NAIL INTERTROCHANTERIC Right 02/07/2015   Procedure: INTRAMEDULLARY (IM) NAIL INTERTROCHANTRIC RIGHT HIP;  Surgeon: Renette Butters, MD;  Location: [facility];  Service: Orthopedics;  Laterality: Right;  . KNEE ARTHROSCOPY Right   . LEFT HEART CATHETERIZATION WITH CORONARY/GRAFT ANGIOGRAM  07/01/2011   Procedure: LEFT HEART CATHETERIZATION WITH Beatrix Fetters;  Surgeon: Sanda Klein, MD;  Location: [facility] CATH LAB;  Service: Cardiovascular;;  . LOOP RECORDER IMPLANT  02/15/2014   MDT LINQ implanted by Dr Rayann Heman for cryptogenic stroke  . PERCUTANEOUS CORONARY STENT INTERVENTION (PCI-S) Right 07/01/2011   Procedure: PERCUTANEOUS CORONARY STENT INTERVENTION (PCI-S);  Surgeon: Sanda Klein, MD;  Location: [facility] CATH LAB;  Service:  Cardiovascular;  Laterality: Right;  . PICC LINE PLACE PERIPHERAL ([place] HX)  06/2015  . RETINAL DETACHMENT SURGERY Right   . TEE WITHOUT CARDIOVERSION N/A 02/15/2014   Procedure: TRANSESOPHAGEAL ECHOCARDIOGRAM (TEE);  Surgeon: Josue Hector, MD;  Location: [facility] ENDOSCOPY;  Service: Cardiovascular;  Laterality: N/A;  . UMBILICAL HERNIA REPAIR  2006    Vitals:   06/03/19 2103  BP: 121/75  Pulse: 69    Subjective Assessment - 06/03/19 1500    Subjective  Wife had two eye surgeries and has been on lifting restrictions and unable to bring patient.  Right now wife does not have vision in R eye due to gas bubble.  Pt feels he has lost some mobility due to his wife's inability to assist with transfers.  No falls.    Patient is accompained by:  Family member   wife   Pertinent History  DNR; anxiety & depression, CAD with CABG, diverticulosis, gout, HLD, HTN, left hemiplegia, NSTEMI (2013), morbid obesity, PE (2016), CVA (2015), tubular adenoma of colon, OSA w/CPAP, right hip fx w/ORIF (2016), iron deficiency anemia    Limitations  Standing;Walking    Patient Stated Goals  to obtain a Bioness for home use; to return to PLOF of ambulating household distances and short community distances with quad cane    Currently in Pain?  No/denies         [facility] PT Assessment - 06/03/19 1502      Assessment   Medical Diagnosis  Left Foot Drop    Referring Provider (PT)  Sater, Nanine Means, MD    Onset Date/Surgical Date  03/29/19    Next MD Visit  in April     Prior Therapy  yes in 2016 s/p CVA 2015      Precautions   Precautions  Fall    Precaution Comments  DNR; anxiety & depression, CAD with CABG, diverticulosis, gout, HLD, HTN, left hemiplegia, NSTEMI (2013), morbid obesity, PE (2016), CVA (2015), tubular adenoma of colon, OSA w/CPAP, right hip fx w/ORIF (2016), iron deficiency anemia      [place] residence    Living Arrangements  Spouse/significant other     Available Help at Discharge  Family    Type of [place] - quad;Grab bars - tub/shower;Tub bench;Bedside commode;Wheelchair - Scientist, product/process development - standard      Prior Function   Level of Independence  Independent with household mobility with device    Vocation  Retired    Leisure  Pharmacist, hospital & worship at [employer], play guitar       Observation/Other Assessments   Focus on Therapeutic Outcomes (FOTO)   Not  assessed      Observation/Other Assessments-Edema    Edema  --   wearing compression socks     Sensation   Additional Comments  --      Coordination   Gross Motor Movements are Fluid and Coordinated  --      Transfers   Transfers  Sit to Stand;Stand Pivot Transfers;Stand to Sit    Sit to Stand  2: Max assist    Sit to Stand Details (indicate cue type and reason)  With RUE support on [employer]  From wheelchair pt required max A to shift weight anteriorly over BOS to stand.  After performing multiple repetitions from elevated mat pt able to perform with min A.      Stand to Sit  3: Mod assist    Stand to Sit Details  Mod A for anterior lean and controlled descent to mat or chair    Stand Pivot Transfers  2: Max assist;3: Mod assist    Stand Pivot Transfer Details (indicate cue type and reason)  Max A to pivot to L; mod A to pivot back to R.  When first attempting to pivot from w/c > mat to L pt became very nervous about falling and returned to sitting in w/c.  During second attempt therapist provided increased facilitation for upright posture, full weight shift to RLE and assistance to ABD and side step LLE to L due to significant scissoring; also provided cues for sequencing and increased stance time on LLE; pt continued to become fearful of falling and attempted ot sit on mat very quickly.  When returning to w/c pt did not attend to therapist's cues to facilitate increased stance time on LLE when  pivoting to R and kept LLE in front of him and pivoted quickly on RLE.  When therapist attempted to slow pt down and perform more controlled pivot pt yelled out, "I need to sit now!".  Pt continues to feel as if his LLE is going to give out and that he is going to fall      Ambulation/Gait   Ambulation/Gait  No    Ambulation/Gait Assistance Details  unable today due to decline in function/mobility and increased fear of falling    Assistive device  Large base quad cane                   [facility] Adult PT Treatment/Exercise - 06/03/19 1502      Posture/Postural Control   Posture/Postural Control  Postural limitations    Postural Limitations  Rounded Shoulders;Forward head;Increased thoracic kyphosis;Flexed trunk;Weight shift left;Left pelvic obliquity   LLE externally rotated, abducted, trunk elongated   Posture Comments  In sitting pt presents with trunk and neck flexion with forward head, L shoulder depression, L trunk elongated, posteriorly rotated L pelvis, L pelvic depression with LLE external rotation and ABD.  Head flexed and rotated to R.      Balance   Balance Assessed  Yes      Dynamic Sitting Balance   Dynamic Sitting - Balance Support  Left upper extremity supported;Feet supported    Dynamic Sitting - Level of Assistance  3: Mod assist    Dynamic Sitting - Balance Activities  Lateral lean/weight shifting;Head control activities    Sitting balance - Comments  therapist supported LUE and positioned head in midline while performing active weight shift and reaching to R side to facilitate L trunk shortening.  While weight shifting to R attempted to have pt flex L hip  and lift LLE x 2 reps.        Therapeutic Activites    Therapeutic Activities  Other Therapeutic Activities    Other Therapeutic Activities  Continued to discuss with pt the limitations of using Bioness right now.  Discussed that the priority of therapy would be to improve postural control, LE ROM and strength,  standing balance/endurance, decreased amount of assistance required for transfers and safety with gait prior to training with Bioness.  Therapist does not feel Bioness would be beneficial at this time when pt is not able to stand or ambulate without significant assistance.  PT feels pt is not a good candidate for the home Bioness at this time; wife would not be able to assist pt with set up at this time due to visual impairments.      Neuro Re-ed    Neuro Re-ed Details   NMR for head and trunk righting to midline while performing sit > stand from mat for improved midline orientation and use of LLE for sit > stand.  Therapist assisted with midline orientation by placing and maintaining head in midline and facilitating anterior lean fully over BOS prior to sit > stand; able to stand with min A and sit with min A and controlled descent      Exercises   Exercises  --             PT Education - 06/03/19 2051    Education Details  see TA; reassessment, focus of therapy, transfers, do not feel pt would be appropriate for Bioness at this time    Person(s) Educated  Patient;Spouse    Methods  Explanation    Comprehension  Verbalized understanding       PT Short Term Goals - 06/03/19 2053      PT SHORT TERM GOAL #1   Title  Patient and wife demonstrate independence with initial LE stretching HEP    Baseline  2nd visit - no HEP set currently delay in patient care due to [place] and wife's eye surgery    Time  4    Period  Weeks    Status  Not Met      PT SHORT TERM GOAL #2   Title  Patient participates in TUG assessment.    Baseline  Pt not ambulatory at this time    Time  4    Period  Weeks    Status  Not Met      PT SHORT TERM GOAL #3   Title  Pt tolerates use of Bioness unit for transfer training with quad cane and min A    Baseline  Mod-max A for transfers - not appropriate for Bioness at this time    Time  4    Period  Weeks    Status  Not Met      PT SHORT TERM GOAL #4    Title  Patient tolerates wear of bioness unit for TUG and gait velocity assessment with Min A    Baseline  not appropriate for Bioness at this time    Time  4    Period  Weeks    Status  Deferred      PT SHORT TERM GOAL #5   Title  Pt will improve gait velocity with quad cane and most appropriate orthosis to >/=0.5 ft/sec    Baseline  not ambulatory at this time    Time  4    Period  Weeks    Status  Not Met        PT Long Term Goals - 06/03/19 2056      PT LONG TERM GOAL #1   Title  Patient and wife demonstrate and verbalizes independence with initial HEP. (all LTGs target date: 07/02/2019)    Time  12    Period  Weeks    Status  Revised      PT LONG TERM GOAL #2   Title  Patient will improve standing balance and endurance to be able to participate in falls risk assessment with TUG with LRAD    Time  12    Period  Weeks    Status  Revised      PT LONG TERM GOAL #3   Title  Patient will improve gait velocity with quad cane and most appropriate orthosis (bioness or AFO) to >0.4 ft/sec    Baseline  .12 ft/sec    Time  12    Period  Weeks    Status  Revised      PT LONG TERM GOAL #4   Title  Patient demonstrates improved L passive ankle DF by at least 10 degrees.    Baseline  lacking 24 deg with knee extended, lacking 10 deg with knee flexed    Time  12    Period  Weeks    Status  New      PT LONG TERM GOAL #5   Title  Pt will demonstrate ability to transfer w/c <> mat/chair with quad cane, most appropriate orthosis and supervision    Time  12    Period  Weeks    Status  New            Plan - 06/03/19 2058    Clinical Impression Statement  Pt returns after long delay in start of care due to [place] and wife's eye surgeries which limited her ability to assist pt with transfers and w/c mobility.  Due to this delay in care pt has experienced a decline in function.  Pt only able to perform stand pivot transfers with mod-max A and is no longer able to ambulate with cane  in order to re-calculate gait velocity and still unable to perform TUG.  Pt continues to be at very high risk for falling.  Due to regression in function and wife's visual loss, pt is not a good candidate for Bioness at this time.  Pt would benefit from continued PT services to address these ongoing impairments to decrease falls risk and increase functional mobility independence.    Personal Factors and Comorbidities  Age;Fitness;Past/Current Experience;Comorbidity 3+;Time since onset of injury/illness/exacerbation    Comorbidities  anixety & depression, CAD, diverticulosis, gout, HLD, HTN, left hemiplegia, NSTEMI (2013), morbid obesity, PE (2016), CVA (2015), tubular adenoma of colon, OSA w/CPAP, right hip fx w/ORIF (2016), iron deficiency anemia    Examination-Activity Limitations  Squat;Stairs;Stand;Dressing;Transfers;Bed Mobility;Locomotion Level    Examination-Participation Restrictions  Church;Community Activity    Stability/Clinical Decision Making  Evolving/Moderate complexity    Rehab Potential  Good    PT Frequency  2x / week    PT Duration  12 weeks    PT Treatment/Interventions  ADLs/Self Care Home Management;Electrical Stimulation;DME Instruction;Balance training;Therapeutic exercise;Orthotic Fit/Training;Gait training;Neuromuscular re-education;Stair training;Functional mobility training;Patient/family education;Therapeutic activities;Manual techniques;Compression bandaging;Passive range of motion    PT Next Visit Plan  **DNR** assess vitals throughout session. DO NOT feel pt is appropriate for Bioness at this time.  Needs to work on weight shifting and WB/sequencing of sit <> stand  and stand pivot transfers; side stepping to L.  NMR postural control/weight shifting. TUG when appropriate; HEP: initiate LLE stretching, LLE strengthening.  SciFit with bilat LE and RUE?    Consulted and Agree with Plan of Care  Patient;Family member/caregiver    Family Member Consulted  spouse        Patient will benefit from skilled therapeutic intervention in order to improve the following deficits and impairments:  Abnormal gait, Decreased range of motion, Difficulty walking, Impaired tone, Obesity, Impaired UE functional use, Decreased endurance, Decreased activity tolerance, Decreased skin integrity, Decreased balance, Decreased knowledge of use of DME, Impaired flexibility, Postural dysfunction, Impaired sensation, Increased edema, Decreased strength, Decreased mobility  Visit Diagnosis: Other abnormalities of gait and mobility  Muscle weakness (generalized)  Unsteadiness on feet  Stiffness of left ankle, not elsewhere classified  Abnormal posture  Repeated falls  Foot drop, left  Spastic hemiplegia of left nondominant side as late effect of cerebral infarction [facility])     Problem List Patient Active Problem List   Diagnosis Date Noted  . Tinea cruris 07/20/2017  . Hemorrhoid 07/20/2017  . Abnormal CT scan 06/08/2017  . Irritability 06/08/2017  . Fall at home 05/06/2017  . Advance care planning 11/28/2016  . Skin mass 11/28/2016  . Lower urinary tract symptoms (LUTS) 09/05/2016  . Hearing loss 09/05/2016  . Excessive daytime sleepiness 04/16/2016  . Exposure to TB 02/21/2016  . Vitamin D deficiency 02/21/2016  . Anemia 11/10/2015  . Left thigh pain 11/10/2015  . Neck pain 10/12/2015  . Hemiplegia affecting left side in left-dominant patient as late effect of cerebrovascular disease ([place]) 09/03/2015  . Medicare annual wellness visit, subsequent 08/14/2015  . Acute pulmonary embolism ([place]) 07/25/2015  . Chest wall pain 07/15/2015  . History of pulmonary embolism 07/15/2015  . Chronic anticoagulation 05/22/2015  . Gait disturbance 05/15/2015  . DNR (do not resuscitate) 01/19/2015  . Hypotension 01/14/2015  . Depression 12/14/2014  . OSA (obstructive sleep apnea) 11/30/2014  . Chronic combined systolic and diastolic congestive heart failure ([place])  11/11/2014  . SOB (shortness of breath) 04/17/2014  . Hemiplegia affecting left dominant side ([place]) 02/16/2014  . 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 06/30/2011    [name], PT, DPT 06/03/19    9:07 PM    [facility] [address] [place], [place], [address] Phone: [phone]   Fax:  [phone]  Name: Gregory Wiley MRN: [id number] Date of Birth: 06-14-1950

## 2019-06-04 ENCOUNTER — Ambulatory Visit: Payer: Medicare Other | Admitting: Family Medicine

## 2019-06-07 ENCOUNTER — Ambulatory Visit: Payer: Medicare Other | Admitting: Physical Therapy

## 2019-06-07 ENCOUNTER — Encounter: Payer: Self-pay | Admitting: Physical Therapy

## 2019-06-07 ENCOUNTER — Other Ambulatory Visit: Payer: Self-pay

## 2019-06-07 VITALS — BP 106/68 | HR 72

## 2019-06-07 DIAGNOSIS — R2689 Other abnormalities of gait and mobility: Secondary | ICD-10-CM

## 2019-06-07 DIAGNOSIS — M6281 Muscle weakness (generalized): Secondary | ICD-10-CM

## 2019-06-07 DIAGNOSIS — R296 Repeated falls: Secondary | ICD-10-CM

## 2019-06-07 DIAGNOSIS — R293 Abnormal posture: Secondary | ICD-10-CM

## 2019-06-07 DIAGNOSIS — R2681 Unsteadiness on feet: Secondary | ICD-10-CM

## 2019-06-07 DIAGNOSIS — M21372 Foot drop, left foot: Secondary | ICD-10-CM

## 2019-06-07 DIAGNOSIS — M25672 Stiffness of left ankle, not elsewhere classified: Secondary | ICD-10-CM

## 2019-06-07 DIAGNOSIS — I69354 Hemiplegia and hemiparesis following cerebral infarction affecting left non-dominant side: Secondary | ICD-10-CM

## 2019-06-08 NOTE — Therapy (Signed)
[facility] [address] [place], [place], [address] Phone: [phone]   Fax:  [phone]  Physical Therapy Treatment  Patient Details  Name: Gregory Wiley MRN: [id number] Date of Birth: [birth year] Referring Provider (PT): Felecia Shelling, Nanine Means, MD   Encounter Date: 06/07/2019  PT End of Session - 06/08/19 1159    Visit Number  3    Number of Visits  25    Date for PT Re-Evaluation  07/02/19    Authorization Type  [employer] Medicare - 10th visit PN    Progress Note Due on Visit  10    PT Start Time  1326    PT Stop Time  1414    PT Time Calculation (min)  48 min    Equipment Utilized During Treatment  Gait belt    Activity Tolerance  Patient limited by pain    Behavior During Therapy  WFL for tasks assessed/performed       Past Medical History:  Diagnosis Date  . Adrenal insufficiency ([place])   . Anxiety   . Basal cell carcinoma of skin of left ankle   . Bleeding stomach ulcer 2015  . Coronary artery disease    stent, then CABG 07/20/10  . Daily headache    "for the past month" (11/11/2014)  . Depression   . Diverticulosis   . GERD (gastroesophageal reflux disease)   . Gout   . H/O cardiomyopathy    ischemic, now last echo 07/30/11, EF 38%W  . History of hiatal hernia   . Hyperlipidemia   . Hypertension   . Internal hemorrhoids   . Left hemiplegia ([place])   . NSTEMI (non-ST elevated myocardial infarction) ([place])    NSTEMI- last cath 06/2011-Stent to LCX-DES, last nuc 07/30/11 low risk  . Obesity (BMI 30-39.9)   . OSA on CPAP    since July 2013- uses CPAP sometimes , states he has lost 147 lbs. since stroke & doesn't use the CPAP as much as he use to.   . Plantar fasciitis of left foot   . Pneumonia    hosp. 12/2014  . Pulmonary embolism ([place]) 03/2014  . Stroke ([place]) 01/2014   "no control of LUE, can slightly LLE; memory problems since" (11/11/2014)  . Tubular adenoma of colon     Past Surgical History:  Procedure  Laterality Date  . BASAL CELL CARCINOMA EXCISION Left 2015   ankle  . CARDIAC SURGERY    . CATARACT EXTRACTION Bilateral   . COLONOSCOPY N/A 02/10/2013   Procedure: COLONOSCOPY;  Surgeon: Ladene Artist, MD;  Location: [facility];  Service: Endoscopy;  Laterality: N/A;  . CORONARY ANGIOPLASTY WITH STENT PLACEMENT  07/01/2011   DES-Resolute to native LCX  . CORONARY ANGIOPLASTY WITH STENT PLACEMENT  12/01/2007   COMPLEX 5 LESION PCI INCLUDING CUTTING BALLOON AND 4 CYPHER DESs  . CORONARY ARTERY BYPASS GRAFT  07/20/2010    LIMA-LAD; VG-ACUTE MARG of RCA; Seq VG-distal RCA & then pda  . EP IMPLANTABLE DEVICE N/A 02/14/2016   Procedure: Loop Recorder Removal;  Surgeon: Thompson Grayer, MD;  Location: [facility];  Service: Cardiovascular;  Laterality: N/A;  . ESOPHAGOGASTRODUODENOSCOPY  01/2014   gastric ulcer, erosive gastroduodenitis, H Pylori negative.   . ESOPHAGOGASTRODUODENOSCOPY (EGD) WITH PROPOFOL N/A 11/14/2014   Procedure: ESOPHAGOGASTRODUODENOSCOPY (EGD) WITH PROPOFOL;  Surgeon: Milus Banister, MD;  Location: [facility];  Service: Endoscopy;  Laterality: N/A;  . HAND SURGERY Right    to take glass out  .  HERNIA REPAIR    . INTRAMEDULLARY (IM) NAIL INTERTROCHANTERIC Right 02/07/2015   Procedure: INTRAMEDULLARY (IM) NAIL INTERTROCHANTRIC RIGHT HIP;  Surgeon: Renette Butters, MD;  Location: [facility];  Service: Orthopedics;  Laterality: Right;  . KNEE ARTHROSCOPY Right   . LEFT HEART CATHETERIZATION WITH CORONARY/GRAFT ANGIOGRAM  07/01/2011   Procedure: LEFT HEART CATHETERIZATION WITH Beatrix Fetters;  Surgeon: Sanda Klein, MD;  Location: [facility]ATH LAB;  Service: Cardiovascular;;  . LOOP RECORDER IMPLANT  02/15/2014   MDT LINQ implanted by Dr Rayann Heman for cryptogenic stroke  . PERCUTANEOUS CORONARY STENT INTERVENTION (PCI-S) Right 07/01/2011   Procedure: PERCUTANEOUS CORONARY STENT INTERVENTION (PCI-S);  Surgeon: Sanda Klein, MD;  Location: [facility] CATH LAB;  Service:  Cardiovascular;  Laterality: Right;  . PICC LINE PLACE PERIPHERAL ([facility] HX)  06/2015  . RETINAL DETACHMENT SURGERY Right   . TEE WITHOUT CARDIOVERSION N/A 02/15/2014   Procedure: TRANSESOPHAGEAL ECHOCARDIOGRAM (TEE);  Surgeon: Josue Hector, MD;  Location: [facility] ENDOSCOPY;  Service: Cardiovascular;  Laterality: N/A;  . UMBILICAL HERNIA REPAIR  2006    Vitals:   06/07/19 1335  BP: 106/68  Pulse: 72    Subjective Assessment - 06/07/19 1335    Subjective  Woke up with a headache.  Brain fog is thick today.  According to CPAP he got 10 hours of sleep last night.  Scissoring almost caused a fall last night with wife.  Wife still can't bend forwards due to eye surgery.    Patient is accompained by:  Family member   wife   Pertinent History  DNR; anxiety & depression, CAD with CABG, diverticulosis, gout, HLD, HTN, left hemiplegia, NSTEMI (2013), morbid obesity, PE (2016), CVA (2015), tubular adenoma of colon, OSA w/CPAP, right hip fx w/ORIF (2016), iron deficiency anemia    Limitations  Standing;Walking    Patient Stated Goals  to obtain a Bioness for home use; to return to PLOF of ambulating household distances and short community distances with quad cane    Currently in Pain?  Yes    Pain Score  5     Pain Location  Head    Pain Descriptors / Indicators  Headache                       [facility] Adult PT Treatment/Exercise - 06/08/19 1149      Bed Mobility   Bed Mobility  Rolling Left;Supine to Sit;Sit to Supine    Rolling Left  Maximal Assistance - Patient 25-49%    Supine to Sit  Maximal Assistance - Patient - Patient 25-49%    Sit to Supine  Moderate Assistance - Patient 50-74%      Transfers   Transfers  Sit to Stand;Stand to Sit;Stand Pivot Transfers    Sit to Stand  3: Mod assist    Sit to Stand Details (indicate cue type and reason)  with assistance to maintain anterior lean and weight shift pt better able to initiate sit > stand    Stand to Sit  3: Mod assist     Stand to Sit Details  to control lowering to chair    Stand Pivot Transfers  3: Mod assist    Stand Pivot Transfer Details (indicate cue type and reason)  Mod A to L and R today with UE support on quad cane.  Pt demonstrated safer and more controlled pivot to R taking time to step each foot around before sitting.  When transferring back to the L Pt demonstrated improved  weight shifting and balance while therapist ABD/side stepped LLE to the L to prevent scissoring.        Ambulation/Gait   Ambulation/Gait  No      Exercises   Exercises  Knee/Hip      Knee/Hip Exercises: Stretches   Piriformis Stretch  Left;5 reps;10 seconds    Piriformis Stretch Limitations  reclined on wedge first with hip and knee extended performing AAROM into hip internal rotation and holding x 10 seconds.  Then placed both LE in hip and knee flexion and performed knee squeezes together with therapist providing overpressure for increased hip IR with pt only able to tolerate holding 5 seconds due to pain    Gastroc Stretch  Left;2 reps;60 seconds    Gastroc Stretch Limitations  reclined on mat    Other Knee/Hip Stretches  Adductor stretch first with "butterfly stretch" and then keeping R hip externally rotated, extended LLE and abducted but little to no stretch felt      Knee/Hip Exercises: Supine   Hip Adduction Isometric  AAROM;Left;1 set;5 reps    Bridges  Strengthening;Both;1 set;5 reps    Bridges Limitations  reclined on wedge verbal cues to lift L hip first    Other Supine Knee/Hip Exercises  lower trunk rotations to L and R x 5 reps with AAROM with limited ROM rotating to R side due to L hip pain             PT Education - 06/08/19 1159    Education Details  attempting to find a way to perform exercises safety at home due to wife's restrictions and pt stays mainly in recliner - not using bed currently    Person(s) Educated  Patient;Spouse    Methods  Explanation;Demonstration    Comprehension  Need  further instruction       PT Short Term Goals - 06/03/19 2053      PT SHORT TERM GOAL #1   Title  Patient and wife demonstrate independence with initial LE stretching HEP    Baseline  2nd visit - no HEP set currently delay in patient care due to COVID and wife's eye surgery    Time  4    Period  Weeks    Status  Not Met      PT SHORT TERM GOAL #2   Title  Patient participates in TUG assessment.    Baseline  Pt not ambulatory at this time    Time  4    Period  Weeks    Status  Not Met      PT SHORT TERM GOAL #3   Title  Pt tolerates use of Bioness unit for transfer training with quad cane and min A    Baseline  Mod-max A for transfers - not appropriate for Bioness at this time    Time  4    Period  Weeks    Status  Not Met      PT SHORT TERM GOAL #4   Title  Patient tolerates wear of bioness unit for TUG and gait velocity assessment with Min A    Baseline  not appropriate for Bioness at this time    Time  4    Period  Weeks    Status  Deferred      PT SHORT TERM GOAL #5   Title  Pt will improve gait velocity with quad cane and most appropriate orthosis to >/=0.5 ft/sec    Baseline  not ambulatory at this  time    Time  4    Period  Weeks    Status  Not Met        PT Long Term Goals - 06/03/19 2056      PT LONG TERM GOAL #1   Title  Patient and wife demonstrate and verbalizes independence with initial HEP. (all LTGs target date: 07/02/2019)    Time  12    Period  Weeks    Status  Revised      PT LONG TERM GOAL #2   Title  Patient will improve standing balance and endurance to be able to participate in falls risk assessment with TUG with LRAD    Time  12    Period  Weeks    Status  Revised      PT LONG TERM GOAL #3   Title  Patient will improve gait velocity with quad cane and most appropriate orthosis (bioness or AFO) to >0.4 ft/sec    Baseline  .12 ft/sec    Time  12    Period  Weeks    Status  Revised      PT LONG TERM GOAL #4   Title  Patient  demonstrates improved L passive ankle DF by at least 10 degrees.    Baseline  lacking 24 deg with knee extended, lacking 10 deg with knee flexed    Time  12    Period  Weeks    Status  New      PT LONG TERM GOAL #5   Title  Pt will demonstrate ability to transfer w/c <> mat/chair with quad cane, most appropriate orthosis and supervision    Time  12    Period  Weeks    Status  New            Plan - 06/08/19 1200    Clinical Impression Statement  Pt demonstrated improved transfers today with improved sequencing and decreased anxiety when performing with therapist's assistance.  Attempted to initiate safe HEP for pt and wife to perform but barriers to HEP include location of patient (recliner), wife's restrictions after surgery (no bending) and pt's willingness to perform.  Pt does have a hospital bed but does not use it and it is covered in "stuff".  Placed pt in reclined position on mat on wedge to simulate sitting in recliner and reviewed exercises to focus on L hip ROM and strengthening. Will continue to assess which exercises will be appropriate at next session.    Personal Factors and Comorbidities  Age;Fitness;Past/Current Experience;Comorbidity 3+;Time since onset of injury/illness/exacerbation    Comorbidities  anixety & depression, CAD, diverticulosis, gout, HLD, HTN, left hemiplegia, NSTEMI (2013), morbid obesity, PE (2016), CVA (2015), tubular adenoma of colon, OSA w/CPAP, right hip fx w/ORIF (2016), iron deficiency anemia    Examination-Activity Limitations  Squat;Stairs;Stand;Dressing;Transfers;Bed Mobility;Locomotion Level    Examination-Participation Restrictions  Church;Community Activity    Stability/Clinical Decision Making  Evolving/Moderate complexity    Rehab Potential  Good    PT Frequency  2x / week    PT Duration  12 weeks    PT Treatment/Interventions  ADLs/Self Care Home Management;Electrical Stimulation;DME Instruction;Balance training;Therapeutic  exercise;Orthotic Fit/Training;Gait training;Neuromuscular re-education;Stair training;Functional mobility training;Patient/family education;Therapeutic activities;Manual techniques;Compression bandaging;Passive range of motion    PT Next Visit Plan  **DNR** assess vitals throughout session. DO NOT feel pt is appropriate for Bioness at this time.  HEP: initiate LLE stretching, LLE strengthening appropriate to do in recliner??  Needs to work on weight shifting  and WB/sequencing of sit <> stand and stand pivot transfers; side stepping to L.  NMR postural control/weight shifting. TUG when appropriate.  SciFit with bilat LE and RUE?    Consulted and Agree with Plan of Care  Patient;Family member/caregiver    Family Member Consulted  spouse       Patient will benefit from skilled therapeutic intervention in order to improve the following deficits and impairments:  Abnormal gait, Decreased range of motion, Difficulty walking, Impaired tone, Obesity, Impaired UE functional use, Decreased endurance, Decreased activity tolerance, Decreased skin integrity, Decreased balance, Decreased knowledge of use of DME, Impaired flexibility, Postural dysfunction, Impaired sensation, Increased edema, Decreased strength, Decreased mobility  Visit Diagnosis: Muscle weakness (generalized)  Other abnormalities of gait and mobility  Unsteadiness on feet  Abnormal posture  Repeated falls  Foot drop, left  Stiffness of left ankle, not elsewhere classified  Spastic hemiplegia of left nondominant side as late effect of cerebral infarction [facility])     Problem List Patient Active Problem List   Diagnosis Date Noted  . Tinea cruris 07/20/2017  . Hemorrhoid 07/20/2017  . Abnormal CT scan 06/08/2017  . Irritability 06/08/2017  . Fall at home 05/06/2017  . Advance care planning 11/28/2016  . Skin mass 11/28/2016  . Lower urinary tract symptoms (LUTS) 09/05/2016  . Hearing loss 09/05/2016  . Excessive daytime  sleepiness 04/16/2016  . Exposure to TB 02/21/2016  . Vitamin D deficiency 02/21/2016  . Anemia 11/10/2015  . Left thigh pain 11/10/2015  . Neck pain 10/12/2015  . Hemiplegia affecting left side in left-dominant patient as late effect of cerebrovascular disease ([place]) 09/03/2015  . Medicare annual wellness visit, subsequent 08/14/2015  . Acute pulmonary embolism ([place]) 07/25/2015  . Chest wall pain 07/15/2015  . History of pulmonary embolism 07/15/2015  . Chronic anticoagulation 05/22/2015  . Gait disturbance 05/15/2015  . DNR (do not resuscitate) 01/19/2015  . Hypotension 01/14/2015  . Depression 12/14/2014  . OSA (obstructive sleep apnea) 11/30/2014  . Chronic combined systolic and diastolic congestive heart failure ([place]) 11/11/2014  . SOB (shortness of breath) 04/17/2014  . Hemiplegia affecting left dominant side ([place]) 02/16/2014  . 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 06/30/2011    [name], PT, DPT 06/08/19    12:07 PM    [facility] [address] [place], [place], [address] Phone: [phone]   Fax:  [phone]  Name: Gregory Wiley MRN: [id number] Date of Birth: 1950-07-31

## 2019-06-09 ENCOUNTER — Other Ambulatory Visit: Payer: Self-pay

## 2019-06-09 ENCOUNTER — Ambulatory Visit: Payer: Medicare Other | Admitting: Physical Therapy

## 2019-06-09 DIAGNOSIS — R2689 Other abnormalities of gait and mobility: Secondary | ICD-10-CM

## 2019-06-09 DIAGNOSIS — R296 Repeated falls: Secondary | ICD-10-CM

## 2019-06-09 DIAGNOSIS — M6281 Muscle weakness (generalized): Secondary | ICD-10-CM

## 2019-06-09 DIAGNOSIS — R2681 Unsteadiness on feet: Secondary | ICD-10-CM

## 2019-06-09 DIAGNOSIS — M25672 Stiffness of left ankle, not elsewhere classified: Secondary | ICD-10-CM

## 2019-06-09 DIAGNOSIS — R293 Abnormal posture: Secondary | ICD-10-CM

## 2019-06-09 DIAGNOSIS — M21372 Foot drop, left foot: Secondary | ICD-10-CM

## 2019-06-09 DIAGNOSIS — I69354 Hemiplegia and hemiparesis following cerebral infarction affecting left non-dominant side: Secondary | ICD-10-CM

## 2019-06-09 NOTE — Patient Instructions (Signed)
EVERY DAY, TWICE A DAY FOR 15 MINUTES:  1. Lower feet to the floor and strap a belt around both thighs above the knees.  Tighten to bring the thighs and knees closer together.  Hold for 15 minutes.  2. While knees are belted together perform 10 active knee squeezes with 5 second hold.

## 2019-06-10 NOTE — Therapy (Signed)
[facility] [address] [place], [place], [address] Phone: [phone]   Fax:  [phone]  Physical Therapy Treatment  Patient Details  Name: Gregory Wiley MRN: [id number] Date of Birth: 06/10/1950 Referring Provider (PT): Felecia Shelling, Nanine Means, MD   Encounter Date: 06/09/2019  PT End of Session - 06/10/19 0958    Visit Number  4    Number of Visits  25    Date for PT Re-Evaluation  07/02/19    Authorization Type  [employer] Medicare - 10th visit PN    Progress Note Due on Visit  10    PT Start Time  1450    PT Stop Time  1535    PT Time Calculation (min)  45 min    Equipment Utilized During Treatment  Gait belt    Activity Tolerance  Patient tolerated treatment well    Behavior During Therapy  [facility] for tasks assessed/performed       Past Medical History:  Diagnosis Date  . Adrenal insufficiency ([place])   . Anxiety   . Basal cell carcinoma of skin of left ankle   . Bleeding stomach ulcer 2015  . Coronary artery disease    stent, then CABG 07/20/10  . Daily headache    "for the past month" (11/11/2014)  . Depression   . Diverticulosis   . GERD (gastroesophageal reflux disease)   . Gout   . H/O cardiomyopathy    ischemic, now last echo 07/30/11, EF 38%W  . History of hiatal hernia   . Hyperlipidemia   . Hypertension   . Internal hemorrhoids   . Left hemiplegia ([place])   . NSTEMI (non-ST elevated myocardial infarction) ([place])    NSTEMI- last cath 06/2011-Stent to LCX-DES, last nuc 07/30/11 low risk  . Obesity (BMI 30-39.9)   . OSA on CPAP    since July 2013- uses CPAP sometimes , states he has lost 147 lbs. since stroke & doesn't use the CPAP as much as he use to.   . Plantar fasciitis of left foot   . Pneumonia    hosp. 12/2014  . Pulmonary embolism ([place]) 03/2014  . Stroke ([place]) 01/2014   "no control of LUE, can slightly LLE; memory problems since" (11/11/2014)  . Tubular adenoma of colon     Past Surgical History:   Procedure Laterality Date  . BASAL CELL CARCINOMA EXCISION Left 2015   ankle  . CARDIAC SURGERY    . CATARACT EXTRACTION Bilateral   . COLONOSCOPY N/A 02/10/2013   Procedure: COLONOSCOPY;  Surgeon: Ladene Artist, MD;  Location: [facility]OS[facility];  Service: Endoscopy;  Laterality: N/A;  . CORONARY ANGIOPLASTY WITH STENT PLACEMENT  07/01/2011   DES-Resolute to native LCX  . CORONARY ANGIOPLASTY WITH STENT PLACEMENT  12/01/2007   COMPLEX 5 LESION PCI INCLUDING CUTTING BALLOON AND 4 CYPHER DESs  . CORONARY ARTERY BYPASS GRAFT  07/20/2010    LIMA-LAD; VG-ACUTE MARG of RCA; Seq VG-distal RCA & then pda  . EP IMPLANTABLE DEVICE N/A 02/14/2016   Procedure: Loop Recorder Removal;  Surgeon: Thompson Grayer, MD;  Location: [facility] CV LAB;  Service: Cardiovascular;  Laterality: N/A;  . ESOPHAGOGASTRODUODENOSCOPY  01/2014   gastric ulcer, erosive gastroduodenitis, H Pylori negative.   . ESOPHAGOGASTRODUODENOSCOPY (EGD) WITH PROPOFOL N/A 11/14/2014   Procedure: ESOPHAGOGASTRODUODENOSCOPY (EGD) WITH PROPOFOL;  Surgeon: Milus Banister, MD;  Location: [facility];  Service: Endoscopy;  Laterality: N/A;  . HAND SURGERY Right    to take glass out  .  HERNIA REPAIR    . INTRAMEDULLARY (IM) NAIL INTERTROCHANTERIC Right 02/07/2015   Procedure: INTRAMEDULLARY (IM) NAIL INTERTROCHANTRIC RIGHT HIP;  Surgeon: Renette Butters, MD;  Location: [facility];  Service: Orthopedics;  Laterality: Right;  . KNEE ARTHROSCOPY Right   . LEFT HEART CATHETERIZATION WITH CORONARY/GRAFT ANGIOGRAM  07/01/2011   Procedure: LEFT HEART CATHETERIZATION WITH Beatrix Fetters;  Surgeon: Sanda Klein, MD;  Location: [facility] CATH LAB;  Service: Cardiovascular;;  . LOOP RECORDER IMPLANT  02/15/2014   MDT LINQ implanted by Dr Rayann Heman for cryptogenic stroke  . PERCUTANEOUS CORONARY STENT INTERVENTION (PCI-S) Right 07/01/2011   Procedure: PERCUTANEOUS CORONARY STENT INTERVENTION (PCI-S);  Surgeon: Sanda Klein, MD;  Location: [facility] CATH LAB;   Service: Cardiovascular;  Laterality: Right;  . PICC LINE PLACE PERIPHERAL ([place] HX)  06/2015  . RETINAL DETACHMENT SURGERY Right   . TEE WITHOUT CARDIOVERSION N/A 02/15/2014   Procedure: TRANSESOPHAGEAL ECHOCARDIOGRAM (TEE);  Surgeon: Josue Hector, MD;  Location: [facility] ENDOSCOPY;  Service: Cardiovascular;  Laterality: N/A;  . UMBILICAL HERNIA REPAIR  2006    There were no vitals filed for this visit.  Subjective Assessment - 06/09/19 1458    Subjective  "Whatever you did the other day cleared up the fog in my head, atleast for a little bit".  Was very fatigued when he got home and had a hard time getting into the house.  Spoke with BIoness rep this morning.    Patient is accompained by:  Family member   wife   Pertinent History  DNR; anxiety & depression, CAD with CABG, diverticulosis, gout, HLD, HTN, left hemiplegia, NSTEMI (2013), morbid obesity, PE (2016), CVA (2015), tubular adenoma of colon, OSA w/CPAP, right hip fx w/ORIF (2016), iron deficiency anemia    Limitations  Standing;Walking    Patient Stated Goals  to obtain a Bioness for home use; to return to PLOF of ambulating household distances and short community distances with quad cane    Currently in Pain?  Yes                       [facility] Adult PT Treatment/Exercise - 06/09/19 1459      Transfers   Transfers  Sit to Stand;Stand to [employer] Transfers    Sit to Stand  3: Mod assist    Sit to Stand Details (indicate cue type and reason)  cues for L foot placement under BOS and to maintain anterior lean; physical assistance required to keep L hip internally rotated to neutral during sit > stand; less assistance needed when standing after performing stretching of L hip    Stand to Sit  3: Mod assist    Stand to Sit Details  to control descent     Stand Pivot Transfers  3: Mod assist    Stand Pivot Transfer Details (indicate cue type and reason)  continues to require increased time to weight shift and  stabilization at L knee during L stance phase.  Assistance required to ABD and place L foot and to prevent L hip external rotation when advancing    Comments  performed to L and R, w/c <> mat      Ambulation/Gait   Ambulation/Gait  No      Therapeutic Activites    Therapeutic Activities  Other Therapeutic Activities    Other Therapeutic Activities  Discussed Bioness email about pt not being a candidate for Bioness due to LE edema.  Reinforced to pt that the edema in his LLE  is not the main barrier to use of Bioness right now; main barrier is severe deconditioning and decline in function and that the primary of PT right now would be restoring pt to functional level where he would be able to safely stand, transfer and ambulate with decreased assistance and decreased falls risk and then therapy could turn focus to use of Bioness.  Pt and wife verbalized understanding.  PT to follow up with Bioness regarding this.      Exercises   Exercises  Other Exercises    Other Exercises   Set pt up on mat in supported and reclined position to simulate sitting in recliner with LE elevated to start with to attempt to determine possible exercises for pt to perform.  Trialed active and active-assisted hip IR with wife's assistance with LLE straight and then with hip/knee flexion.  Pt unable to perform sufficiently when in reclined position.  Also attempted to use belt to stretch L gastroc with RUE but only caused ankle to invert.  Attempted LAQ but due to hip external rotation pt unable to active quads efficiently.  Only exercise prescribed to pt right now is described below.  Will focus on prolonged PROM and intermittent AROM to assist with hip position during transfers and standing and then will progress from there.             PT Education - 06/10/19 0957    Education Details  see TA; attempted to find appropriate HEP for pt to perform in recliner    Person(s) Educated  Patient;Spouse    Methods   Explanation;Demonstration;Handout    Comprehension  Verbalized understanding;Returned demonstration       PT Short Term Goals - 06/03/19 2053      PT SHORT TERM GOAL #1   Title  Patient and wife demonstrate independence with initial LE stretching HEP    Baseline  2nd visit - no HEP set currently delay in patient care due to [place] and wife's eye surgery    Time  4    Period  Weeks    Status  Not Met      PT SHORT TERM GOAL #2   Title  Patient participates in TUG assessment.    Baseline  Pt not ambulatory at this time    Time  4    Period  Weeks    Status  Not Met      PT SHORT TERM GOAL #3   Title  Pt tolerates use of Bioness unit for transfer training with quad cane and min A    Baseline  Mod-max A for transfers - not appropriate for Bioness at this time    Time  4    Period  Weeks    Status  Not Met      PT SHORT TERM GOAL #4   Title  Patient tolerates wear of bioness unit for TUG and gait velocity assessment with Min A    Baseline  not appropriate for Bioness at this time    Time  4    Period  Weeks    Status  Deferred      PT SHORT TERM GOAL #5   Title  Pt will improve gait velocity with quad cane and most appropriate orthosis to >/=0.5 ft/sec    Baseline  not ambulatory at this time    Time  4    Period  Weeks    Status  Not Met        PT  Long Term Goals - 06/03/19 2056      PT LONG TERM GOAL #1   Title  Patient and wife demonstrate and verbalizes independence with initial HEP. (all LTGs target date: 07/02/2019)    Time  12    Period  Weeks    Status  Revised      PT LONG TERM GOAL #2   Title  Patient will improve standing balance and endurance to be able to participate in falls risk assessment with TUG with LRAD    Time  12    Period  Weeks    Status  Revised      PT LONG TERM GOAL #3   Title  Patient will improve gait velocity with quad cane and most appropriate orthosis (bioness or AFO) to >0.4 ft/sec    Baseline  .12 ft/sec    Time  12    Period   Weeks    Status  Revised      PT LONG TERM GOAL #4   Title  Patient demonstrates improved L passive ankle DF by at least 10 degrees.    Baseline  lacking 24 deg with knee extended, lacking 10 deg with knee flexed    Time  12    Period  Weeks    Status  New      PT LONG TERM GOAL #5   Title  Pt will demonstrate ability to transfer w/c <> mat/chair with quad cane, most appropriate orthosis and supervision    Time  12    Period  Weeks    Status  New            Plan - 06/10/19 0958    Clinical Impression Statement  Continued to attempt to find appropriate exercises for pt and wife to perform (within wife's limitations after surgery) and due to patient's significant physical limitations right now.  Only able to provide pt with sustained PROM to L hip into IR with active hip ADD and IR while seated in recliner.  Increasing hip IR strength and ROM will assist pt with safer sit <> stand and transfers - pt currently utilizing significant amounts of hip ER as substitute for hip flexion and hip ABD.  Will continue to progress towards LTG.  Bioness continues to be on hold right now due to pt is inappropriate due to severe limits in mobility currently; will reassess for Bioness as pt mobility and safety improves.    Personal Factors and Comorbidities  Age;Fitness;Past/Current Experience;Comorbidity 3+;Time since onset of injury/illness/exacerbation    Comorbidities  anixety & depression, CAD, diverticulosis, gout, HLD, HTN, left hemiplegia, NSTEMI (2013), morbid obesity, PE (2016), CVA (2015), tubular adenoma of colon, OSA w/CPAP, right hip fx w/ORIF (2016), iron deficiency anemia    Examination-Activity Limitations  Squat;Stairs;Stand;Dressing;Transfers;Bed Mobility;Locomotion Level    Examination-Participation Restrictions  Church;Community Activity    Stability/Clinical Decision Making  Evolving/Moderate complexity    Rehab Potential  Good    PT Frequency  2x / week    PT Duration  12 weeks     PT Treatment/Interventions  ADLs/Self Care Home Management;Electrical Stimulation;DME Instruction;Balance training;Therapeutic exercise;Orthotic Fit/Training;Gait training;Neuromuscular re-education;Stair training;Functional mobility training;Patient/family education;Therapeutic activities;Manual techniques;Compression bandaging;Passive range of motion    PT Next Visit Plan  **DNR** assess vitals throughout session. DO NOT feel pt is appropriate for Bioness at this time.  How is HEP going?  Needs to work on using hip ABD and hip flexion for LE advancement and not hip ER.  Needs to work on  weight shifting and WB/sequencing of sit <> stand and stand pivot transfers; side stepping to L.  NMR postural control/weight shifting. TUG when appropriate.  SciFit with bilat LE and RUE?    Consulted and Agree with Plan of Care  Patient;Family member/caregiver    Family Member Consulted  spouse       Patient will benefit from skilled therapeutic intervention in order to improve the following deficits and impairments:  Abnormal gait, Decreased range of motion, Difficulty walking, Impaired tone, Obesity, Impaired UE functional use, Decreased endurance, Decreased activity tolerance, Decreased skin integrity, Decreased balance, Decreased knowledge of use of DME, Impaired flexibility, Postural dysfunction, Impaired sensation, Increased edema, Decreased strength, Decreased mobility  Visit Diagnosis: Muscle weakness (generalized)  Other abnormalities of gait and mobility  Unsteadiness on feet  Abnormal posture  Repeated falls  Foot drop, left  Spastic hemiplegia of left nondominant side as late effect of cerebral infarction (HCC)  Stiffness of left ankle, not elsewhere classified     Problem List Patient Active Problem List   Diagnosis Date Noted  . Tinea cruris 07/20/2017  . Hemorrhoid 07/20/2017  . Abnormal CT scan 06/08/2017  . Irritability 06/08/2017  . Fall at home 05/06/2017  . Advance care  planning 11/28/2016  . Skin mass 11/28/2016  . Lower urinary tract symptoms (LUTS) 09/05/2016  . Hearing loss 09/05/2016  . Excessive daytime sleepiness 04/16/2016  . Exposure to TB 02/21/2016  . Vitamin D deficiency 02/21/2016  . Anemia 11/10/2015  . Left thigh pain 11/10/2015  . Neck pain 10/12/2015  . Hemiplegia affecting left side in left-dominant patient as late effect of cerebrovascular disease ([place]) 09/03/2015  . Medicare annual wellness visit, subsequent 08/14/2015  . Acute pulmonary embolism ([place]) 07/25/2015  . Chest wall pain 07/15/2015  . History of pulmonary embolism 07/15/2015  . Chronic anticoagulation 05/22/2015  . Gait disturbance 05/15/2015  . DNR (do not resuscitate) 01/19/2015  . Hypotension 01/14/2015  . Depression 12/14/2014  . OSA (obstructive sleep apnea) 11/30/2014  . Chronic combined systolic and diastolic congestive heart failure ([place]) 11/11/2014  . SOB (shortness of breath) 04/17/2014  . Hemiplegia affecting left dominant side ([place]) 02/16/2014  . 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 06/30/2011    [name], PT, DPT 06/10/19    10:07 AM    [facility] [address] [place], [place], [address] Phone: [phone]   Fax:  [phone]  Name: Gregory Wiley MRN: [id number] Date of Birth: May 30, 1950

## 2019-06-14 ENCOUNTER — Encounter: Payer: Self-pay | Admitting: Physical Therapy

## 2019-06-14 ENCOUNTER — Other Ambulatory Visit: Payer: Self-pay

## 2019-06-14 ENCOUNTER — Ambulatory Visit: Payer: Medicare Other | Admitting: Physical Therapy

## 2019-06-14 DIAGNOSIS — I69354 Hemiplegia and hemiparesis following cerebral infarction affecting left non-dominant side: Secondary | ICD-10-CM

## 2019-06-14 DIAGNOSIS — M6281 Muscle weakness (generalized): Secondary | ICD-10-CM

## 2019-06-14 DIAGNOSIS — M21372 Foot drop, left foot: Secondary | ICD-10-CM

## 2019-06-14 DIAGNOSIS — R296 Repeated falls: Secondary | ICD-10-CM

## 2019-06-14 DIAGNOSIS — R293 Abnormal posture: Secondary | ICD-10-CM

## 2019-06-14 DIAGNOSIS — R2689 Other abnormalities of gait and mobility: Secondary | ICD-10-CM | POA: Diagnosis not present

## 2019-06-14 DIAGNOSIS — R2681 Unsteadiness on feet: Secondary | ICD-10-CM

## 2019-06-14 NOTE — Therapy (Signed)
[facility] [address] [place], [place], [address] Phone: [phone]   Fax:  [phone]  Physical Therapy Treatment  Patient Details  Name: Gregory Wiley MRN: [id number] Date of Birth: [birth year] Referring Provider (PT): Felecia Shelling, Nanine Means, MD   Encounter Date: 06/14/2019  PT End of Session - 06/14/19 1421    Visit Number  5    Number of Visits  25    Date for PT Re-Evaluation  07/02/19    Authorization Type  [employer] Medicare - 10th visit PN    Progress Note Due on Visit  10    PT Start Time  1322    PT Stop Time  1402    PT Time Calculation (min)  40 min    Equipment Utilized During Treatment  Gait belt    Activity Tolerance  Patient tolerated treatment well    Behavior During Therapy  WFL for tasks assessed/performed       Past Medical History:  Diagnosis Date  . Adrenal insufficiency ([place])   . Anxiety   . Basal cell carcinoma of skin of left ankle   . Bleeding stomach ulcer 2015  . Coronary artery disease    stent, then CABG 07/20/10  . Daily headache    "for the past month" (11/11/2014)  . Depression   . Diverticulosis   . GERD (gastroesophageal reflux disease)   . Gout   . H/O cardiomyopathy    ischemic, now last echo 07/30/11, EF 38%W  . History of hiatal hernia   . Hyperlipidemia   . Hypertension   . Internal hemorrhoids   . Left hemiplegia ([place])   . NSTEMI (non-ST elevated myocardial infarction) ([place])    NSTEMI- last cath 06/2011-Stent to LCX-DES, last nuc 07/30/11 low risk  . Obesity (BMI 30-39.9)   . OSA on CPAP    since July 2013- uses CPAP sometimes , states he has lost 147 lbs. since stroke & doesn't use the CPAP as much as he use to.   . Plantar fasciitis of left foot   . Pneumonia    hosp. 12/2014  . Pulmonary embolism ([place]) 03/2014  . Stroke ([place]) 01/2014   "no control of LUE, can slightly LLE; memory problems since" (11/11/2014)  . Tubular adenoma of colon     Past Surgical History:   Procedure Laterality Date  . BASAL CELL CARCINOMA EXCISION Left 2015   ankle  . CARDIAC SURGERY    . CATARACT EXTRACTION Bilateral   . COLONOSCOPY N/A 02/10/2013   Procedure: COLONOSCOPY;  Surgeon: Ladene Artist, MD;  Location: [facility];  Service: Endoscopy;  Laterality: N/A;  . CORONARY ANGIOPLASTY WITH STENT PLACEMENT  07/01/2011   DES-Resolute to native LCX  . CORONARY ANGIOPLASTY WITH STENT PLACEMENT  12/01/2007   COMPLEX 5 LESION PCI INCLUDING CUTTING BALLOON AND 4 CYPHER DESs  . CORONARY ARTERY BYPASS GRAFT  07/20/2010    LIMA-LAD; VG-ACUTE MARG of RCA; Seq VG-distal RCA & then pda  . EP IMPLANTABLE DEVICE N/A 02/14/2016   Procedure: Loop Recorder Removal;  Surgeon: Thompson Grayer, MD;  Location: [facility];  Service: Cardiovascular;  Laterality: N/A;  . ESOPHAGOGASTRODUODENOSCOPY  01/2014   gastric ulcer, erosive gastroduodenitis, H Pylori negative.   . ESOPHAGOGASTRODUODENOSCOPY (EGD) WITH PROPOFOL N/A 11/14/2014   Procedure: ESOPHAGOGASTRODUODENOSCOPY (EGD) WITH PROPOFOL;  Surgeon: Milus Banister, MD;  Location: [facility];  Service: Endoscopy;  Laterality: N/A;  . HAND SURGERY Right    to take glass out  .  HERNIA REPAIR    . INTRAMEDULLARY (IM) NAIL INTERTROCHANTERIC Right 02/07/2015   Procedure: INTRAMEDULLARY (IM) NAIL INTERTROCHANTRIC RIGHT HIP;  Surgeon: Renette Butters, MD;  Location: [facility];  Service: Orthopedics;  Laterality: Right;  . KNEE ARTHROSCOPY Right   . LEFT HEART CATHETERIZATION WITH CORONARY/GRAFT ANGIOGRAM  07/01/2011   Procedure: LEFT HEART CATHETERIZATION WITH Beatrix Fetters;  Surgeon: Sanda Klein, MD;  Location: [facility];  Service: Cardiovascular;;  . LOOP RECORDER IMPLANT  02/15/2014   MDT LINQ implanted by Dr Rayann Heman for cryptogenic stroke  . PERCUTANEOUS CORONARY STENT INTERVENTION (PCI-S) Right 07/01/2011   Procedure: PERCUTANEOUS CORONARY STENT INTERVENTION (PCI-S);  Surgeon: Sanda Klein, MD;  Location: [facility] LAB;   Service: Cardiovascular;  Laterality: Right;  . PICC LINE PLACE PERIPHERAL ([place] HX)  06/2015  . RETINAL DETACHMENT SURGERY Right   . TEE WITHOUT CARDIOVERSION N/A 02/15/2014   Procedure: TRANSESOPHAGEAL ECHOCARDIOGRAM (TEE);  Surgeon: Josue Hector, MD;  Location: [facility] ENDOSCOPY;  Service: Cardiovascular;  Laterality: N/A;  . UMBILICAL HERNIA REPAIR  2006    There were no vitals filed for this visit.  Subjective Assessment - 06/14/19 1326    Subjective  Didn't try strapping legs together but did work on stretching and bring knees together.  Transferring in the bathroom was better.    Patient is accompained by:  Family member   wife   Pertinent History  DNR; anxiety & depression, CAD with CABG, diverticulosis, gout, HLD, HTN, left hemiplegia, NSTEMI (2013), morbid obesity, PE (2016), CVA (2015), tubular adenoma of colon, OSA w/CPAP, right hip fx w/ORIF (2016), iron deficiency anemia    Limitations  Standing;Walking    Patient Stated Goals  to obtain a Bioness for home use; to return to PLOF of ambulating household distances and short community distances with quad cane    Currently in Pain?  No/denies                       [facility] Adult PT Treatment/Exercise - 06/14/19 1328      Bed Mobility   Bed Mobility  Rolling Right;Right Sidelying to Sit;Sit to Supine    Rolling Right  Moderate Assistance - Patient 50-74%    Right Sidelying to Sit  Maximal Assistance - Patient 25-49%    Sit to Supine  Moderate Assistance - Patient 50-74%      Transfers   Transfers  Sit to Stand;Stand to Sit;Stand Pivot Transfers    Sit to Stand  3: Mod assist    Sit to Stand Details (indicate cue type and reason)  cues for L foot placement and to keep L hip rotated to neutral; increased assistance required to shift COG forwards over BOS and maintain while pushing to stand    Stand to Sit  3: Mod assist    Stand to Sit Details  to control descent    Stand Pivot Transfers  4: Min assist    Stand Pivot  Transfer Details (indicate cue type and reason)  Min A to R today; assistance required on L side to maintain upright trunk, verbal cues to bring LLE fully under COG and to weight shift onto LLE prior to pivoting or sidestepping with RLE; no assistance required to advance LLE today but did not perform transfer to L    Number of Reps  2 sets      Knee/Hip Exercises: Stretches   Piriformis Stretch  Left;20 seconds;10 seconds    Piriformis Stretch Limitations  performed reclined on  mat first with knees bent and performing lower trunk rotation to R side x 10 reps with 10 second hold.  When pt in R sidelying allowed L thigh to flex and adduct with gravity and held for >20 seconds while therapist performed soft tissue massage to L piriformis; tender over greater trochanter    Gastroc Stretch  Left;1 rep;20 seconds    Gastroc Stretch Limitations  reclined on mat      Knee/Hip Exercises: Supine   Bridges  Strengthening;Both;1 set;5 reps    Bridges Limitations  reclined on mat; performed bridges with shift to L and R to add in hip rotation; at end of bridges placed pillow under L hip/pelvis to raise it level with R    Other Supine Knee/Hip Exercises  with LLE straight performed active L hip internal rotation with isometric hold x 10 reps with 5 second hold.  Added to hip IR, hip ABD in supine, open chain x 10 reps with therapist assisting with maintaining neutral hip rotation      Knee/Hip Exercises: Sidelying   Other Sidelying Knee/Hip Exercises  gravity minimized hip flexion with knee flexion with therapist supporting to maintain thigh in alignment with hip - performed x 10 reps with no evidence of compensatory hip ER             PT Education - 06/14/19 1420    Education Details  continued to encourage pt to perform prolonged hip IR and active hip IR when in recliner.    Person(s) Educated  Patient;Spouse    Methods  Explanation    Comprehension  Verbalized understanding       PT Short Term  Goals - 06/03/19 2053      PT SHORT TERM GOAL #1   Title  Patient and wife demonstrate independence with initial LE stretching HEP    Baseline  2nd visit - no HEP set currently delay in patient care due to COVID and wife's eye surgery    Time  4    Period  Weeks    Status  Not Met      PT SHORT TERM GOAL #2   Title  Patient participates in TUG assessment.    Baseline  Pt not ambulatory at this time    Time  4    Period  Weeks    Status  Not Met      PT SHORT TERM GOAL #3   Title  Pt tolerates use of Bioness unit for transfer training with quad cane and min A    Baseline  Mod-max A for transfers - not appropriate for Bioness at this time    Time  4    Period  Weeks    Status  Not Met      PT SHORT TERM GOAL #4   Title  Patient tolerates wear of bioness unit for TUG and gait velocity assessment with Min A    Baseline  not appropriate for Bioness at this time    Time  4    Period  Weeks    Status  Deferred      PT SHORT TERM GOAL #5   Title  Pt will improve gait velocity with quad cane and most appropriate orthosis to >/=0.5 ft/sec    Baseline  not ambulatory at this time    Time  4    Period  Weeks    Status  Not Met        PT Long Term Goals - 06/03/19  2056      PT LONG TERM GOAL #1   Title  Patient and wife demonstrate and verbalizes independence with initial HEP. (all LTGs target date: 07/02/2019)    Time  12    Period  Weeks    Status  Revised      PT LONG TERM GOAL #2   Title  Patient will improve standing balance and endurance to be able to participate in falls risk assessment with TUG with LRAD    Time  12    Period  Weeks    Status  Revised      PT LONG TERM GOAL #3   Title  Patient will improve gait velocity with quad cane and most appropriate orthosis (bioness or AFO) to >0.4 ft/sec    Baseline  .12 ft/sec    Time  12    Period  Weeks    Status  Revised      PT LONG TERM GOAL #4   Title  Patient demonstrates improved L passive ankle DF by at least  10 degrees.    Baseline  lacking 24 deg with knee extended, lacking 10 deg with knee flexed    Time  12    Period  Weeks    Status  New      PT LONG TERM GOAL #5   Title  Pt will demonstrate ability to transfer w/c <> mat/chair with quad cane, most appropriate orthosis and supervision    Time  12    Period  Weeks    Status  New            Plan - 06/14/19 1422    Clinical Impression Statement  Pt is demonstrating improved L hip ROM and positioning each session and improved WB through LLE and safer LLE placement when performing pivot transfers.  Continued to encourage pt and wife to perform what exercises they can at home.  With improved LLE positioning and activation will begin to perform more exercises and activities in standing.    Personal Factors and Comorbidities  Age;Fitness;Past/Current Experience;Comorbidity 3+;Time since onset of injury/illness/exacerbation    Comorbidities  anixety & depression, CAD, diverticulosis, gout, HLD, HTN, left hemiplegia, NSTEMI (2013), morbid obesity, PE (2016), CVA (2015), tubular adenoma of colon, OSA w/CPAP, right hip fx w/ORIF (2016), iron deficiency anemia    Examination-Activity Limitations  Squat;Stairs;Stand;Dressing;Transfers;Bed Mobility;Locomotion Level    Examination-Participation Restrictions  Church;Community Activity    Stability/Clinical Decision Making  Evolving/Moderate complexity    Rehab Potential  Good    PT Frequency  2x / week    PT Duration  12 weeks    PT Treatment/Interventions  ADLs/Self Care Home Management;Electrical Stimulation;DME Instruction;Balance training;Therapeutic exercise;Orthotic Fit/Training;Gait training;Neuromuscular re-education;Stair training;Functional mobility training;Patient/family education;Therapeutic activities;Manual techniques;Compression bandaging;Passive range of motion    PT Next Visit Plan  **DNR** assess vitals throughout session. DO NOT feel pt is appropriate for Bioness at this time.  Ways  to use theraband or leg lifter for home exercises?  Supine and sidelying - Needs to work on using hip ABD and hip flexion for LE advancement and not hip ER.  Needs to work on weight shifting and WB/sequencing of sit <> stand and stand pivot transfers; side stepping to L.  NMR postural control/weight shifting. TUG when appropriate.  SciFit with bilat LE and RUE?    Consulted and Agree with Plan of Care  Patient;Family member/caregiver    Family Member Consulted  spouse       Patient will benefit from  skilled therapeutic intervention in order to improve the following deficits and impairments:  Abnormal gait, Decreased range of motion, Difficulty walking, Impaired tone, Obesity, Impaired UE functional use, Decreased endurance, Decreased activity tolerance, Decreased skin integrity, Decreased balance, Decreased knowledge of use of DME, Impaired flexibility, Postural dysfunction, Impaired sensation, Increased edema, Decreased strength, Decreased mobility  Visit Diagnosis: Other abnormalities of gait and mobility  Muscle weakness (generalized)  Unsteadiness on feet  Abnormal posture  Repeated falls  Foot drop, left  Spastic hemiplegia of left nondominant side as late effect of cerebral infarction [facility])     Problem List Patient Active Problem List   Diagnosis Date Noted  . Tinea cruris 07/20/2017  . Hemorrhoid 07/20/2017  . Abnormal CT scan 06/08/2017  . Irritability 06/08/2017  . Fall at home 05/06/2017  . Advance care planning 11/28/2016  . Skin mass 11/28/2016  . Lower urinary tract symptoms (LUTS) 09/05/2016  . Hearing loss 09/05/2016  . Excessive daytime sleepiness 04/16/2016  . Exposure to TB 02/21/2016  . Vitamin D deficiency 02/21/2016  . Anemia 11/10/2015  . Left thigh pain 11/10/2015  . Neck pain 10/12/2015  . Hemiplegia affecting left side in left-dominant patient as late effect of cerebrovascular disease ([place]) 09/03/2015  . Medicare annual wellness visit, subsequent  08/14/2015  . Acute pulmonary embolism ([place]) 07/25/2015  . Chest wall pain 07/15/2015  . History of pulmonary embolism 07/15/2015  . Chronic anticoagulation 05/22/2015  . Gait disturbance 05/15/2015  . DNR (do not resuscitate) 01/19/2015  . Hypotension 01/14/2015  . Depression 12/14/2014  . OSA (obstructive sleep apnea) 11/30/2014  . Chronic combined systolic and diastolic congestive heart failure ([place]) 11/11/2014  . SOB (shortness of breath) 04/17/2014  . Hemiplegia affecting left dominant side ([place]) 02/16/2014  . 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 06/30/2011    [name], PT, DPT 06/14/19    2:26 PM    [facility] [address] [place], [place], [address] Phone: [phone]   Fax:  [phone]  Name: SENON NIXON MRN: [id number] Date of Birth: [birth year]

## 2019-06-16 ENCOUNTER — Other Ambulatory Visit: Payer: Self-pay

## 2019-06-16 ENCOUNTER — Ambulatory Visit: Payer: Medicare Other | Admitting: Physical Therapy

## 2019-06-16 DIAGNOSIS — R296 Repeated falls: Secondary | ICD-10-CM

## 2019-06-16 DIAGNOSIS — R2681 Unsteadiness on feet: Secondary | ICD-10-CM

## 2019-06-16 DIAGNOSIS — R2689 Other abnormalities of gait and mobility: Secondary | ICD-10-CM

## 2019-06-16 DIAGNOSIS — M6281 Muscle weakness (generalized): Secondary | ICD-10-CM

## 2019-06-16 DIAGNOSIS — M21372 Foot drop, left foot: Secondary | ICD-10-CM

## 2019-06-16 DIAGNOSIS — I69354 Hemiplegia and hemiparesis following cerebral infarction affecting left non-dominant side: Secondary | ICD-10-CM

## 2019-06-16 DIAGNOSIS — R293 Abnormal posture: Secondary | ICD-10-CM

## 2019-06-16 NOTE — Therapy (Signed)
[facility] [address] [place], [place], [address] Phone: [phone]   Fax:  [phone]  Physical Therapy Treatment  Patient Details  Name: Gregory Wiley MRN: [id number] Date of Birth: [birth year] Referring Provider (PT): Felecia Shelling, Nanine Means, MD   Encounter Date: 06/16/2019  PT End of Session - 06/16/19 2202    Visit Number  6    Number of Visits  25    Date for PT Re-Evaluation  07/02/19    Authorization Type  [employer] Medicare - 10th visit PN    Progress Note Due on Visit  10    PT Start Time  1458    PT Stop Time  1540    PT Time Calculation (min)  42 min    Equipment Utilized During Treatment  Gait belt    Activity Tolerance  Patient tolerated treatment well    Behavior During Therapy  WFL for tasks assessed/performed       Past Medical History:  Diagnosis Date  . Adrenal insufficiency ([place])   . Anxiety   . Basal cell carcinoma of skin of left ankle   . Bleeding stomach ulcer 2015  . Coronary artery disease    stent, then CABG 07/20/10  . Daily headache    "for the past month" (11/11/2014)  . Depression   . Diverticulosis   . GERD (gastroesophageal reflux disease)   . Gout   . H/O cardiomyopathy    ischemic, now last echo 07/30/11, EF 38%W  . History of hiatal hernia   . Hyperlipidemia   . Hypertension   . Internal hemorrhoids   . Left hemiplegia ([place])   . NSTEMI (non-ST elevated myocardial infarction) ([place])    NSTEMI- last cath 06/2011-Stent to LCX-DES, last nuc 07/30/11 low risk  . Obesity (BMI 30-39.9)   . OSA on CPAP    since July 2013- uses CPAP sometimes , states he has lost 147 lbs. since stroke & doesn't use the CPAP as much as he use to.   . Plantar fasciitis of left foot   . Pneumonia    hosp. 12/2014  . Pulmonary embolism ([place]) 03/2014  . Stroke ([place]) 01/2014   "no control of LUE, can slightly LLE; memory problems since" (11/11/2014)  . Tubular adenoma of colon     Past Surgical History:   Procedure Laterality Date  . BASAL CELL CARCINOMA EXCISION Left 2015   ankle  . CARDIAC SURGERY    . CATARACT EXTRACTION Bilateral   . COLONOSCOPY N/A 02/10/2013   Procedure: COLONOSCOPY;  Surgeon: Ladene Artist, MD;  Location: [facility];  Service: Endoscopy;  Laterality: N/A;  . CORONARY ANGIOPLASTY WITH STENT PLACEMENT  07/01/2011   DES-Resolute to native LCX  . CORONARY ANGIOPLASTY WITH STENT PLACEMENT  12/01/2007   COMPLEX 5 LESION PCI INCLUDING CUTTING BALLOON AND 4 CYPHER DESs  . CORONARY ARTERY BYPASS GRAFT  07/20/2010    LIMA-LAD; VG-ACUTE MARG of RCA; Seq VG-distal RCA & then pda  . EP IMPLANTABLE DEVICE N/A 02/14/2016   Procedure: Loop Recorder Removal;  Surgeon: Thompson Grayer, MD;  Location: [facility];  Service: Cardiovascular;  Laterality: N/A;  . ESOPHAGOGASTRODUODENOSCOPY  01/2014   gastric ulcer, erosive gastroduodenitis, H Pylori negative.   . ESOPHAGOGASTRODUODENOSCOPY (EGD) WITH PROPOFOL N/A 11/14/2014   Procedure: ESOPHAGOGASTRODUODENOSCOPY (EGD) WITH PROPOFOL;  Surgeon: Milus Banister, MD;  Location: [facility];  Service: Endoscopy;  Laterality: N/A;  . HAND SURGERY Right    to take glass out  .  HERNIA REPAIR    . INTRAMEDULLARY (IM) NAIL INTERTROCHANTERIC Right 02/07/2015   Procedure: INTRAMEDULLARY (IM) NAIL INTERTROCHANTRIC RIGHT HIP;  Surgeon: Renette Butters, MD;  Location: [facility];  Service: Orthopedics;  Laterality: Right;  . KNEE ARTHROSCOPY Right   . LEFT HEART CATHETERIZATION WITH CORONARY/GRAFT ANGIOGRAM  07/01/2011   Procedure: LEFT HEART CATHETERIZATION WITH Beatrix Fetters;  Surgeon: Sanda Klein, MD;  Location: [facility] LAB;  Service: Cardiovascular;;  . LOOP RECORDER IMPLANT  02/15/2014   MDT LINQ implanted by Dr Rayann Heman for cryptogenic stroke  . PERCUTANEOUS CORONARY STENT INTERVENTION (PCI-S) Right 07/01/2011   Procedure: PERCUTANEOUS CORONARY STENT INTERVENTION (PCI-S);  Surgeon: Sanda Klein, MD;  Location: [facility] LAB;   Service: Cardiovascular;  Laterality: Right;  . PICC LINE PLACE PERIPHERAL ([place] HX)  06/2015  . RETINAL DETACHMENT SURGERY Right   . TEE WITHOUT CARDIOVERSION N/A 02/15/2014   Procedure: TRANSESOPHAGEAL ECHOCARDIOGRAM (TEE);  Surgeon: Josue Hector, MD;  Location: [facility]OSCOPY;  Service: Cardiovascular;  Laterality: N/A;  . UMBILICAL HERNIA REPAIR  2006    There were no vitals filed for this visit.  Subjective Assessment - 06/16/19 1502    Subjective  Hip was tender after last session.  Feeling better today.  Brought theraband and leg lifter today.    Patient is accompained by:  Family member   wife   Pertinent History  DNR; anxiety & depression, CAD with CABG, diverticulosis, gout, HLD, HTN, left hemiplegia, NSTEMI (2013), morbid obesity, PE (2016), CVA (2015), tubular adenoma of colon, OSA w/CPAP, right hip fx w/ORIF (2016), iron deficiency anemia    Limitations  Standing;Walking    Patient Stated Goals  to obtain a Bioness for home use; to return to PLOF of ambulating household distances and short community distances with quad cane    Currently in Pain?  No/denies                       [facility] Adult PT Treatment/Exercise - 06/16/19 1504      Transfers   Transfers  Sit to Stand;Stand to [employer] Transfers    Sit to Stand  3: Mod assist;4: Min assist    Sit to Stand Details (indicate cue type and reason)  to stand from low transport w/c pt required mod A to shift COG forwards over BOS and to maintain while transitioning to stand.  Following exercises on the mat pt peformed anterior weight shift and sit > stand with minimal assistance.  Attempted to have pt sit on blue wedge for perched sitting for increased quad activation during sit > stand training - pt only able to perform one stand from wedge and then requested wedge be removed due to burning in R quad.  Pt reports this also happens when he stands from his lift chair    Stand to Sit  3: Mod assist    Stand to Sit  Details  to control descent into transport chair to prevent it from tipping backwards    Stand Pivot Transfers  4: Min assist;3: Mod assist    Stand Pivot Transfer Details (indicate cue type and reason)  Min A to R with quad cane and only assistance required to maintain upright posture; when transferring to L side pt continued to require assistance to safely advance and place LLE and to prevent LLE from crossing midline; pt became fatigued and very fearful of falling when transferring to L and began to yell at therapist, "I need to sit down!"  Therapist guided and assisted pt with sitting safely      Ambulation/Gait   Ambulation/Gait  Yes    Ambulation/Gait Assistance  2: Max assist    Ambulation/Gait Assistance Details  Provided assistance on L for upright posture, weight shift to L during stance phase and assistance to guide L foot during swing to prevent crossing midline    Ambulation Distance (Feet)  6 Feet    Assistive device  Large base quad cane    Gait Pattern  Step-to pattern;Decreased arm swing - left;Decreased step length - left;Decreased step length - right;Decreased stance time - left;Decreased stride length;Decreased hip/knee flexion - left;Decreased dorsiflexion - left;Decreased weight shift to left;Scissoring    Ambulation Surface  Level;Indoor      Neuro Re-ed    Neuro Re-ed Details   Seated on edge of mat placed bilat UE on physioball in front of patient; performed re-education to midline with head and trunk righting and then maintaining in midline during therapy ball roll outs in midline and slightly to L x 5 reps to increase elongation of R side and facilitate anterior weight shift for sit > stand      Exercises   Exercises  Other Exercises    Other Exercises   seated on edge of mat attempted to find ways to use theratubing and leg lifter from home to perform HEP in recliner.  Attempted to use leg lifter for gastroc and hamstring stretch pulling with RUE and then looping around  LUE and leaning posteriorly.  Unable to perform sufficient stretch or maintain.  Attempted to use theratubing for resistance into hip ADD/IR and hip flexion but unable to perform in seated position.  Currently not able to use at home for LE HEP.      Knee/Hip Exercises: Standing   Functional Squat  3 seconds;Other (comment)    Functional Squat Limitations  3 sets of coming to squat position off mat with RUE support on arm rest in front of patient and therapist positioning and maintaining position of LLE while pt performed lateral weight shifts through pelvis to L side; pt only able to tolerate one weight shift to L prior to needing to sit back down               PT Short Term Goals - 06/03/19 2053      PT SHORT TERM GOAL #1   Title  Patient and wife demonstrate independence with initial LE stretching HEP    Baseline  2nd visit - no HEP set currently delay in patient care due to COVID and wife's eye surgery    Time  4    Period  Weeks    Status  Not Met      PT SHORT TERM GOAL #2   Title  Patient participates in TUG assessment.    Baseline  Pt not ambulatory at this time    Time  4    Period  Weeks    Status  Not Met      PT SHORT TERM GOAL #3   Title  Pt tolerates use of Bioness unit for transfer training with quad cane and min A    Baseline  Mod-max A for transfers - not appropriate for Bioness at this time    Time  4    Period  Weeks    Status  Not Met      PT SHORT TERM GOAL #4   Title  Patient tolerates wear of bioness unit for TUG  and gait velocity assessment with Min A    Baseline  not appropriate for Bioness at this time    Time  4    Period  Weeks    Status  Deferred      PT SHORT TERM GOAL #5   Title  Pt will improve gait velocity with quad cane and most appropriate orthosis to >/=0.5 ft/sec    Baseline  not ambulatory at this time    Time  4    Period  Weeks    Status  Not Met        PT Long Term Goals - 06/03/19 2056      PT LONG TERM GOAL #1    Title  Patient and wife demonstrate and verbalizes independence with initial HEP. (all LTGs target date: 07/02/2019)    Time  12    Period  Weeks    Status  Revised      PT LONG TERM GOAL #2   Title  Patient will improve standing balance and endurance to be able to participate in falls risk assessment with TUG with LRAD    Time  12    Period  Weeks    Status  Revised      PT LONG TERM GOAL #3   Title  Patient will improve gait velocity with quad cane and most appropriate orthosis (bioness or AFO) to >0.4 ft/sec    Baseline  .12 ft/sec    Time  12    Period  Weeks    Status  Revised      PT LONG TERM GOAL #4   Title  Patient demonstrates improved L passive ankle DF by at least 10 degrees.    Baseline  lacking 24 deg with knee extended, lacking 10 deg with knee flexed    Time  12    Period  Weeks    Status  New      PT LONG TERM GOAL #5   Title  Pt will demonstrate ability to transfer w/c <> mat/chair with quad cane, most appropriate orthosis and supervision    Time  12    Period  Weeks    Status  New            Plan - 06/16/19 2202    Clinical Impression Statement  Continued to find ways to perform HEP in recliner; due to patient requiring gravity minimized position for effective activation and movement of LLE, not able to perform at this time.  Attempted multiple activities in sitting to facilitate increased anterior weight shift, increased weight shift to L, L hip ROM, R trunk elongation and LE activation but pt demonstrated poor tolerance of all activities due to pain from stretching of muscles or from fear of falling.  Pt was able to ambulate with therapist's mod-max A very short distance but again became very feaful of falling and requested to sit very quickly.  Will continue to progress towards LTG.    Personal Factors and Comorbidities  Age;Fitness;Past/Current Experience;Comorbidity 3+;Time since onset of injury/illness/exacerbation    Comorbidities  anixety &  depression, CAD, diverticulosis, gout, HLD, HTN, left hemiplegia, NSTEMI (2013), morbid obesity, PE (2016), CVA (2015), tubular adenoma of colon, OSA w/CPAP, right hip fx w/ORIF (2016), iron deficiency anemia    Examination-Activity Limitations  Squat;Stairs;Stand;Dressing;Transfers;Bed Mobility;Locomotion Level    Examination-Participation Restrictions  Church;Community Activity    Stability/Clinical Decision Making  Evolving/Moderate complexity    Rehab Potential  Good    PT Frequency  2x /  week    PT Duration  12 weeks    PT Treatment/Interventions  ADLs/Self Care Home Management;Electrical Stimulation;DME Instruction;Balance training;Therapeutic exercise;Orthotic Fit/Training;Gait training;Neuromuscular re-education;Stair training;Functional mobility training;Patient/family education;Therapeutic activities;Manual techniques;Compression bandaging;Passive range of motion    PT Next Visit Plan  **DNR** assess vitals throughout session. DO NOT feel pt is appropriate for Bioness at this time. Seated therapy ball roll outs and sit > stand; Supine and sidelying - Needs to work on using hip ABD and hip flexion for LE advancement and not hip ER.  Needs to work on weight shifting and WB/sequencing of sit <> stand and stand pivot transfers; side stepping to L.  NMR postural control/weight shifting. TUG when appropriate.  SciFit with bilat LE and RUE?    Consulted and Agree with Plan of Care  Patient;Family member/caregiver    Family Member Consulted  spouse       Patient will benefit from skilled therapeutic intervention in order to improve the following deficits and impairments:  Abnormal gait, Decreased range of motion, Difficulty walking, Impaired tone, Obesity, Impaired UE functional use, Decreased endurance, Decreased activity tolerance, Decreased skin integrity, Decreased balance, Decreased knowledge of use of DME, Impaired flexibility, Postural dysfunction, Impaired sensation, Increased edema,  Decreased strength, Decreased mobility  Visit Diagnosis: Other abnormalities of gait and mobility  Muscle weakness (generalized)  Unsteadiness on feet  Abnormal posture  Repeated falls  Foot drop, left  Spastic hemiplegia of left nondominant side as late effect of cerebral infarction [facility])     Problem List Patient Active Problem List   Diagnosis Date Noted  . Tinea cruris 07/20/2017  . Hemorrhoid 07/20/2017  . Abnormal CT scan 06/08/2017  . Irritability 06/08/2017  . Fall at home 05/06/2017  . Advance care planning 11/28/2016  . Skin mass 11/28/2016  . Lower urinary tract symptoms (LUTS) 09/05/2016  . Hearing loss 09/05/2016  . Excessive daytime sleepiness 04/16/2016  . Exposure to TB 02/21/2016  . Vitamin D deficiency 02/21/2016  . Anemia 11/10/2015  . Left thigh pain 11/10/2015  . Neck pain 10/12/2015  . Hemiplegia affecting left side in left-dominant patient as late effect of cerebrovascular disease ([place]) 09/03/2015  . Medicare annual wellness visit, subsequent 08/14/2015  . Acute pulmonary embolism ([place]) 07/25/2015  . Chest wall pain 07/15/2015  . History of pulmonary embolism 07/15/2015  . Chronic anticoagulation 05/22/2015  . Gait disturbance 05/15/2015  . DNR (do not resuscitate) 01/19/2015  . Hypotension 01/14/2015  . Depression 12/14/2014  . OSA (obstructive sleep apnea) 11/30/2014  . Chronic combined systolic and diastolic congestive heart failure ([place]) 11/11/2014  . SOB (shortness of breath) 04/17/2014  . Hemiplegia affecting left dominant side ([place]) 02/16/2014  . 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 ([place]) 02/12/2014  . 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 06/30/2011    [name], PT, DPT 06/16/19    10:07 PM    [facility] [address] [place], [place], [address] Phone: [phone]   Fax:  [phone]  Name: Gregory Wiley MRN: [id number] Date of Birth: 05-05-1950

## 2019-06-17 ENCOUNTER — Encounter: Payer: Self-pay | Admitting: Family Medicine

## 2019-06-17 ENCOUNTER — Ambulatory Visit (INDEPENDENT_AMBULATORY_CARE_PROVIDER_SITE_OTHER): Payer: Medicare Other | Admitting: Family Medicine

## 2019-06-17 VITALS — BP 126/64 | HR 85 | Temp 97.0°F | Ht 72.0 in

## 2019-06-17 DIAGNOSIS — I63411 Cerebral infarction due to embolism of right middle cerebral artery: Secondary | ICD-10-CM | POA: Diagnosis not present

## 2019-06-17 DIAGNOSIS — D509 Iron deficiency anemia, unspecified: Secondary | ICD-10-CM

## 2019-06-17 DIAGNOSIS — K219 Gastro-esophageal reflux disease without esophagitis: Secondary | ICD-10-CM | POA: Diagnosis not present

## 2019-06-17 DIAGNOSIS — F32A Depression, unspecified: Secondary | ICD-10-CM

## 2019-06-17 DIAGNOSIS — F329 Major depressive disorder, single episode, unspecified: Secondary | ICD-10-CM

## 2019-06-17 MED ORDER — PANTOPRAZOLE SODIUM 40 MG PO TBEC: 40.0000 mg | DELAYED_RELEASE_TABLET | Freq: Two times a day (BID) | ORAL | 3 refills | Status: DC

## 2019-06-17 MED ORDER — ROSUVASTATIN CALCIUM 40 MG PO TABS: 40.0000 mg | ORAL_TABLET | Freq: Every day | ORAL | Status: DC

## 2019-06-17 NOTE — Progress Notes (Signed)
This visit occurred during the SARS-CoV-2 public health emergency.  Safety protocols were in place, including screening questions prior to the visit, additional usage of staff PPE, and extensive cleaning of exam room while observing appropriate contact time as indicated for disinfecting solutions.  IDA.  Due for labs.  Compliant with iron.  No ADE on med.  We talked prev about rationale for ongoing iron replacement.   His son died since last OV here.  His sone was living in [place] and had acromegaly.  Mood d/w pt.  Still on zoloft.  No ADE on med.  He has h/o irritability and "it hasn't hit me yet about my son."  D/w pt.  He has support from wife.    He had L 1st toenail removed.  He wanted his foot checked.   GERD.  He had tried pepcid, it didn't work.  Protonix worked.  He is out of medication in the meantime with inc in GERD.  GERD prev controlled with BID PPI.    He has h/o CVA.  Still anticoagulated. Doing PT 2x/week.  He has episodes where he'll think he sees something that isn't there, episodically/brief, over the last few months.  No loss of vision. No dysuria.  No fevers, no chills.  He has had some blurry vision in the AMs but no vision loss.  No focal neuro changes o/w.  He is aware of the episodes.    Meds, vitals, and allergies reviewed.   ROS: Per HPI unless specifically indicated in ROS section   nad ncat In wheelchair at baseline with L sided weakness at baseline.  Oriented to time, month, day.  Can do math.  3/3 attention, 2/3 recall.   Neck supple, no LA RRR with rare ectopy. ctab abd soft, not ttp.  L1st toenail removed but doesn't appear infected.  Appears to be healing normally.  Intact DP pulse.  (reassured patient, he'll update me as needed)

## 2019-06-17 NOTE — Patient Instructions (Addendum)
Let me know if you can't get the pantoprazole.  Go to the lab on the way out.   If you have mychart we'll likely use that to update you.    Take care.  Glad to see you. Don't change your meds for now.

## 2019-06-18 ENCOUNTER — Other Ambulatory Visit: Payer: Self-pay | Admitting: Family Medicine

## 2019-06-18 DIAGNOSIS — H539 Unspecified visual disturbance: Secondary | ICD-10-CM

## 2019-06-18 DIAGNOSIS — K219 Gastro-esophageal reflux disease without esophagitis: Secondary | ICD-10-CM | POA: Insufficient documentation

## 2019-06-18 DIAGNOSIS — Z8673 Personal history of transient ischemic attack (TIA), and cerebral infarction without residual deficits: Secondary | ICD-10-CM

## 2019-06-18 LAB — LIPID PANEL
Cholesterol: 134 mg/dL (ref <200)
HDL: 46 mg/dL (ref >=40)
LDL Cholesterol (Calc): 72 mg/dL (calc)
Non-HDL Cholesterol (Calc): 88 mg/dL (calc) (ref <130)
Total CHOL/HDL Ratio: 2.9 (calc) (ref <5.0)
Triglycerides: 82 mg/dL (ref <150)

## 2019-06-18 LAB — COMPREHENSIVE METABOLIC PANEL
AG Ratio: 1.3 (calc) (ref 1.0–2.5)
ALT: 16 U/L (ref 9–46)
AST: 20 U/L (ref 10–35)
Albumin: 3.9 g/dL (ref 3.6–5.1)
Alkaline phosphatase (APISO): 71 U/L (ref 35–144)
BUN: 17 mg/dL (ref 7–25)
CO2: 28 mmol/L (ref 20–32)
Calcium: 9.2 mg/dL (ref 8.6–10.3)
Chloride: 106 mmol/L (ref 98–110)
Creat: 1.03 mg/dL (ref 0.70–1.25)
Globulin: 3.1 g/dL (calc) (ref 1.9–3.7)
Glucose, Bld: 77 mg/dL (ref 65–99)
Potassium: 4.8 mmol/L (ref 3.5–5.3)
Sodium: 142 mmol/L (ref 135–146)
Total Bilirubin: 0.5 mg/dL (ref 0.2–1.2)
Total Protein: 7 g/dL (ref 6.1–8.1)

## 2019-06-18 LAB — IRON, TOTAL/TOTAL IRON BINDING CAP
%SAT: 43 % (calc) (ref 20–48)
Iron: 104 ug/dL (ref 50–180)
TIBC: 240 mcg/dL (calc) — ABNORMAL LOW (ref 250–425)

## 2019-06-18 LAB — CBC WITH DIFFERENTIAL/PLATELET
Absolute Monocytes: 476 cells/uL (ref 200–950)
Basophils Absolute: 49 cells/uL (ref 0–200)
Basophils Relative: 0.7 %
Eosinophils Absolute: 623 cells/uL — ABNORMAL HIGH (ref 15–500)
Eosinophils Relative: 8.9 %
HCT: 46.5 % (ref 38.5–50.0)
Hemoglobin: 15.1 g/dL (ref 13.2–17.1)
Lymphs Abs: 1652 cells/uL (ref 850–3900)
MCH: 29.3 pg (ref 27.0–33.0)
MCHC: 32.5 g/dL (ref 32.0–36.0)
MCV: 90.3 fL (ref 80.0–100.0)
MPV: 11.8 fL (ref 7.5–12.5)
Monocytes Relative: 6.8 %
Neutro Abs: 4200 cells/uL (ref 1500–7800)
Neutrophils Relative %: 60 %
Platelets: 144 10*3/uL (ref 140–400)
RBC: 5.15 10*6/uL (ref 4.20–5.80)
RDW: 12.7 % (ref 11.0–15.0)
Total Lymphocyte: 23.6 %
WBC: 7 10*3/uL (ref 3.8–10.8)

## 2019-06-18 NOTE — Assessment & Plan Note (Signed)
Restart PPI given prev response.  rx sent.

## 2019-06-18 NOTE — Assessment & Plan Note (Signed)
Continue iron replacement.  Recheck labs today.  See notes on labs.  At least 30 minutes were devoted to patient care in this encounter (this can potentially include time spent reviewing the patient's file/history, interviewing and examining the patient, counseling/reviewing plan with patient, ordering referrals, ordering tests, reviewing relevant laboratory or x-ray data, and documenting the encounter).

## 2019-06-18 NOTE — Assessment & Plan Note (Signed)
See above.  Continue SSRI.  Has supportive from wife.  Condolences offered.

## 2019-06-18 NOTE — Assessment & Plan Note (Signed)
No new focal findings on exam but h/o episodic brief visions of objects that are not present, noted over the last few months.  Unclear source.  We agreed to check basic labs at this point and go from there.  See notes on labs.  He doesn't have obvious sign or sx of infection.

## 2019-06-21 ENCOUNTER — Encounter: Payer: Self-pay | Admitting: Emergency Medicine

## 2019-06-21 ENCOUNTER — Encounter: Payer: Self-pay | Admitting: Physical Therapy

## 2019-06-21 ENCOUNTER — Encounter: Payer: Self-pay | Admitting: Family Medicine

## 2019-06-21 ENCOUNTER — Other Ambulatory Visit: Payer: Self-pay

## 2019-06-21 ENCOUNTER — Ambulatory Visit: Payer: Medicare Other | Admitting: Physical Therapy

## 2019-06-21 ENCOUNTER — Telehealth: Payer: Self-pay

## 2019-06-21 ENCOUNTER — Ambulatory Visit
Admission: EM | Admit: 2019-06-21 | Discharge: 2019-06-21 | Disposition: A | Discharge status: Home / Self Care | Payer: Medicare Other | Attending: Emergency Medicine | Admitting: Emergency Medicine

## 2019-06-21 ENCOUNTER — Ambulatory Visit (INDEPENDENT_AMBULATORY_CARE_PROVIDER_SITE_OTHER): Payer: Medicare Other

## 2019-06-21 DIAGNOSIS — W19XXXA Unspecified fall, initial encounter: Secondary | ICD-10-CM

## 2019-06-21 DIAGNOSIS — R296 Repeated falls: Secondary | ICD-10-CM | POA: Insufficient documentation

## 2019-06-21 DIAGNOSIS — M21372 Foot drop, left foot: Secondary | ICD-10-CM | POA: Insufficient documentation

## 2019-06-21 DIAGNOSIS — M4812 Ankylosing hyperostosis [Forestier], cervical region: Secondary | ICD-10-CM

## 2019-06-21 DIAGNOSIS — R2681 Unsteadiness on feet: Secondary | ICD-10-CM | POA: Insufficient documentation

## 2019-06-21 DIAGNOSIS — I69354 Hemiplegia and hemiparesis following cerebral infarction affecting left non-dominant side: Secondary | ICD-10-CM | POA: Insufficient documentation

## 2019-06-21 DIAGNOSIS — M542 Cervicalgia: Secondary | ICD-10-CM | POA: Diagnosis not present

## 2019-06-21 DIAGNOSIS — M6281 Muscle weakness (generalized): Secondary | ICD-10-CM | POA: Insufficient documentation

## 2019-06-21 DIAGNOSIS — R293 Abnormal posture: Secondary | ICD-10-CM | POA: Insufficient documentation

## 2019-06-21 DIAGNOSIS — R2689 Other abnormalities of gait and mobility: Secondary | ICD-10-CM | POA: Insufficient documentation

## 2019-06-21 DIAGNOSIS — M25672 Stiffness of left ankle, not elsewhere classified: Secondary | ICD-10-CM | POA: Insufficient documentation

## 2019-06-21 MED ORDER — PREDNISONE 20 MG PO TABS: 20.0000 mg | ORAL_TABLET | Freq: Every day | ORAL | 0 refills | Status: DC

## 2019-06-21 NOTE — Therapy (Signed)
[facility] [address] [place], [place], [address] Phone: [phone]   Fax:  [phone]  Physical Therapy Treatment  Patient Details  Name: Gregory Wiley MRN: [id number] Date of Birth: 1950-05-11 Referring Provider (PT): Felecia Shelling, Nanine Means, MD   Encounter Date: 06/21/2019 - No charge for today's visit due to concerning medical symptoms following fall this weekend.  PT End of Session - 06/21/19 1413    Visit Number  --   arrive no charge today   Number of Visits  25    Date for PT Re-Evaluation  07/02/19    Authorization Type  [employer] Medicare - 10th visit PN    Progress Note Due on Visit  10    PT Start Time  1324    PT Stop Time  1405    PT Time Calculation (min)  41 min    Activity Tolerance  Patient limited by pain;Treatment limited secondary to medical complications (Comment)       Past Medical History:  Diagnosis Date  . Adrenal insufficiency ([place])   . Anxiety   . Basal cell carcinoma of skin of left ankle   . Bleeding stomach ulcer 2015  . Coronary artery disease    stent, then CABG 07/20/10  . Daily headache    "for the past month" (11/11/2014)  . Depression   . Diverticulosis   . GERD (gastroesophageal reflux disease)   . Gout   . H/O cardiomyopathy    ischemic, now last echo 07/30/11, EF 38%W  . History of hiatal hernia   . Hyperlipidemia   . Hypertension   . Internal hemorrhoids   . Left hemiplegia ([place])   . NSTEMI (non-ST elevated myocardial infarction) ([place])    NSTEMI- last cath 06/2011-Stent to LCX-DES, last nuc 07/30/11 low risk  . Obesity (BMI 30-39.9)   . OSA on CPAP    since July 2013- uses CPAP sometimes , states he has lost 147 lbs. since stroke & doesn't use the CPAP as much as he use to.   . Plantar fasciitis of left foot   . Pneumonia    hosp. 12/2014  . Pulmonary embolism ([place]) 03/2014  . Stroke ([place]) 01/2014   "no control of LUE, can slightly LLE; memory problems since" (11/11/2014)   . Tubular adenoma of colon     Past Surgical History:  Procedure Laterality Date  . BASAL CELL CARCINOMA EXCISION Left 2015   ankle  . CARDIAC SURGERY    . CATARACT EXTRACTION Bilateral   . COLONOSCOPY N/A 02/10/2013   Procedure: COLONOSCOPY;  Surgeon: Ladene Artist, MD;  Location: [facility];  Service: Endoscopy;  Laterality: N/A;  . CORONARY ANGIOPLASTY WITH STENT PLACEMENT  07/01/2011   DES-Resolute to native LCX  . CORONARY ANGIOPLASTY WITH STENT PLACEMENT  12/01/2007   COMPLEX 5 LESION PCI INCLUDING CUTTING BALLOON AND 4 CYPHER DESs  . CORONARY ARTERY BYPASS GRAFT  07/20/2010    LIMA-LAD; VG-ACUTE MARG of RCA; Seq VG-distal RCA & then pda  . EP IMPLANTABLE DEVICE N/A 02/14/2016   Procedure: Loop Recorder Removal;  Surgeon: Thompson Grayer, MD;  Location: [facility];  Service: Cardiovascular;  Laterality: N/A;  . ESOPHAGOGASTRODUODENOSCOPY  01/2014   gastric ulcer, erosive gastroduodenitis, H Pylori negative.   . ESOPHAGOGASTRODUODENOSCOPY (EGD) WITH PROPOFOL N/A 11/14/2014   Procedure: ESOPHAGOGASTRODUODENOSCOPY (EGD) WITH PROPOFOL;  Surgeon: Milus Banister, MD;  Location: [facility];  Service: Endoscopy;  Laterality: N/A;  . HAND SURGERY Right  to take glass out  . HERNIA REPAIR    . INTRAMEDULLARY (IM) NAIL INTERTROCHANTERIC Right 02/07/2015   Procedure: INTRAMEDULLARY (IM) NAIL INTERTROCHANTRIC RIGHT HIP;  Surgeon: Renette Butters, MD;  Location: [facility];  Service: Orthopedics;  Laterality: Right;  . KNEE ARTHROSCOPY Right   . LEFT HEART CATHETERIZATION WITH CORONARY/GRAFT ANGIOGRAM  07/01/2011   Procedure: LEFT HEART CATHETERIZATION WITH Beatrix Fetters;  Surgeon: Sanda Klein, MD;  Location: [facility] CATH LAB;  Service: Cardiovascular;;  . LOOP RECORDER IMPLANT  02/15/2014   MDT LINQ implanted by Dr Rayann Heman for cryptogenic stroke  . PERCUTANEOUS CORONARY STENT INTERVENTION (PCI-S) Right 07/01/2011   Procedure: PERCUTANEOUS CORONARY STENT INTERVENTION (PCI-S);   Surgeon: Sanda Klein, MD;  Location: [facility] CATH LAB;  Service: Cardiovascular;  Laterality: Right;  . PICC LINE PLACE PERIPHERAL ([place] HX)  06/2015  . RETINAL DETACHMENT SURGERY Right   . TEE WITHOUT CARDIOVERSION N/A 02/15/2014   Procedure: TRANSESOPHAGEAL ECHOCARDIOGRAM (TEE);  Surgeon: Josue Hector, MD;  Location: [facility] ENDOSCOPY;  Service: Cardiovascular;  Laterality: N/A;  . UMBILICAL HERNIA REPAIR  2006    There were no vitals filed for this visit.  Subjective Assessment - 06/21/19 1328    Subjective  Pt arrived wearing soft C collar and large abrasion on R knee.  Pt had a severe fall this weekend, was trying to transfer to [facility] and lost his balance and fell on his face, lift chair fell on top of him - pt reports hearing a "snap".    Denies change in sensation.  Has lost some motion rotating head to R.  Denies changes in UE strength or LE strength.  Is wearing the C collar just for comfort and stability when riding.  R knee is tender; no issues with WB.    Patient is accompained by:  Family member   wife   Pertinent History  DNR; anxiety & depression, CAD with CABG, diverticulosis, gout, HLD, HTN, left hemiplegia, NSTEMI (2013), morbid obesity, PE (2016), CVA (2015), tubular adenoma of colon, OSA w/CPAP, right hip fx w/ORIF (2016), iron deficiency anemia    Limitations  Standing;Walking    Patient Stated Goals  to obtain a Bioness for home use; to return to PLOF of ambulating household distances and short community distances with quad cane    Currently in Pain?  Yes    Pain Score  9     Pain Location  Neck    Pain Orientation  Left         [facility] PT Assessment - 06/21/19 1346      Special Tests    Special Tests  Cervical    Other special tests  .    Cervical Tests  Spurling's;other;other2      Spurling's   Findings  Negative    Comment  bilat, no symtoms      other    Findings  Positive    Side  Right    Comment  Alar Ligament test R and L:       other    Findings  Negative     Comment  Transverse ligmament test          Following fall this weekend therapist performed cervical spine stability assessment with instability noted during alar ligament testing.  Pt also positive for pain during Alar ligament testing.  Due to findings, significant increase in pain and mechanism of fall, therapy held for today.  Contacted patient's PCP and will route this note to PCP and neurologist.  Advised pt to only perform transfers if wife is present to provide supervision and assistance to prevent any further falls and further injury.  Pt stated, "I will try, when I have to go, I have to go."  Wife verbalized understanding.  Will wait to hear from physician for further instructions and for clearance to return to therapy.                   PT Education - 06/21/19 1413    Education Details  advised against PT today due to findings and patient pain.  Provided pt and wife with recommendations.    Person(s) Educated  Patient;Spouse    Methods  Explanation    Comprehension  Verbalized understanding       PT Short Term Goals - 06/03/19 2053      PT SHORT TERM GOAL #1   Title  Patient and wife demonstrate independence with initial LE stretching HEP    Baseline  2nd visit - no HEP set currently delay in patient care due to COVID and wife's eye surgery    Time  4    Period  Weeks    Status  Not Met      PT SHORT TERM GOAL #2   Title  Patient participates in TUG assessment.    Baseline  Pt not ambulatory at this time    Time  4    Period  Weeks    Status  Not Met      PT SHORT TERM GOAL #3   Title  Pt tolerates use of Bioness unit for transfer training with quad cane and min A    Baseline  Mod-max A for transfers - not appropriate for Bioness at this time    Time  4    Period  Weeks    Status  Not Met      PT SHORT TERM GOAL #4   Title  Patient tolerates wear of bioness unit for TUG and gait velocity assessment with Min A    Baseline  not appropriate for  Bioness at this time    Time  4    Period  Weeks    Status  Deferred      PT SHORT TERM GOAL #5   Title  Pt will improve gait velocity with quad cane and most appropriate orthosis to >/=0.5 ft/sec    Baseline  not ambulatory at this time    Time  4    Period  Weeks    Status  Not Met        PT Long Term Goals - 06/03/19 2056      PT LONG TERM GOAL #1   Title  Patient and wife demonstrate and verbalizes independence with initial HEP. (all LTGs target date: 07/02/2019)    Time  12    Period  Weeks    Status  Revised      PT LONG TERM GOAL #2   Title  Patient will improve standing balance and endurance to be able to participate in falls risk assessment with TUG with LRAD    Time  12    Period  Weeks    Status  Revised      PT LONG TERM GOAL #3   Title  Patient will improve gait velocity with quad cane and most appropriate orthosis (bioness or AFO) to >0.4 ft/sec    Baseline  .12 ft/sec    Time  12    Period  Weeks  Status  Revised      PT LONG TERM GOAL #4   Title  Patient demonstrates improved L passive ankle DF by at least 10 degrees.    Baseline  lacking 24 deg with knee extended, lacking 10 deg with knee flexed    Time  12    Period  Weeks    Status  New      PT LONG TERM GOAL #5   Title  Pt will demonstrate ability to transfer w/c <> mat/chair with quad cane, most appropriate orthosis and supervision    Time  12    Period  Weeks    Status  New          Visit Diagnosis: Repeated falls     Problem List Patient Active Problem List   Diagnosis Date Noted  . GERD (gastroesophageal reflux disease) 06/18/2019  . Tinea cruris 07/20/2017  . Hemorrhoid 07/20/2017  . Irritability 06/08/2017  . Fall at home 05/06/2017  . Advance care planning 11/28/2016  . Skin mass 11/28/2016  . Lower urinary tract symptoms (LUTS) 09/05/2016  . Hearing loss 09/05/2016  . Excessive daytime sleepiness 04/16/2016  . Exposure to TB 02/21/2016  . Vitamin D deficiency  02/21/2016  . Anemia 11/10/2015  . Left thigh pain 11/10/2015  . Neck pain 10/12/2015  . Hemiplegia affecting left side in left-dominant patient as late effect of cerebrovascular disease ([place]) 09/03/2015  . Medicare annual wellness visit, subsequent 08/14/2015  . Acute pulmonary embolism ([place]) 07/25/2015  . Chest wall pain 07/15/2015  . History of pulmonary embolism 07/15/2015  . Chronic anticoagulation 05/22/2015  . Gait disturbance 05/15/2015  . DNR (do not resuscitate) 01/19/2015  . Hypotension 01/14/2015  . Depression 12/14/2014  . OSA (obstructive sleep apnea) 11/30/2014  . Chronic combined systolic and diastolic congestive heart failure ([place]) 11/11/2014  . SOB (shortness of breath) 04/17/2014  . Hemiplegia affecting left dominant side ([place]) 02/16/2014  . 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 06/30/2011    [name], PT, DPT 06/21/19    2:20 PM    [facility] [address] [place], [place], [address] Phone: [phone]   Fax:  [phone]  Name: Gregory Wiley MRN: [id number] Date of Birth: 20-Aug-1950

## 2019-06-21 NOTE — Discharge Instructions (Addendum)
Recommend RICE: rest, ice, compression, elevation as needed for pain.   Heat therapy (hot compress, warm wash red, hot showers, etc.) can help relax muscles and soothe muscle aches. Cold therapy (ice packs) can be used to help swelling both after injury and after prolonged use of areas of chronic pain/aches.  For pain: take prednisone once daily with breakfast

## 2019-06-21 NOTE — Telephone Encounter (Signed)
Misty Stanley PT with [facility] left v/m that pt came to PT today wearing a soft cervical collar. Over weekend pt fell flat on face and lift chair fell on pt. Pt heard a snap in neck area when fell. Pain level is 9. Pt has no burning, no new weakness, no radiating pain, no numbness and no tingling. When Audra did two ligament stability test; the alar ligament showed instability on the right. No PT was done today. Letta Moynahan wants to if pt needs an xray or to have the CT scheduled later in the wk moved up. What is the next step. Audra request cb. Per pts chart review tab pt is at [facility] [address]. A Access nurse note was faxed to [facility] but has not come over access nurse portal. Will make copy of access nurse note and place in Dr Josefine Class in box until note comes over portal.

## 2019-06-21 NOTE — Telephone Encounter (Signed)
[facility] - Client TELEPHONE ADVICE RECORD AccessNurse Patient Name: Gregory Wiley Gender: Male DOB: 09-Jan-1951 Age: 69 Y 20 M 25 D Return Phone Number: [phone] (Primary), [phone] (Secondary) Address[place] [address] Client [facility] - Client Client Site [place] - Day Physician Renford Dills - MD Contact Type Call Who Is Calling Patient / Member / Family / Caregiver Call Type Triage / Clinical Caller Name [name] Relationship To Patient Self Return Phone Number [phone] (Primary) Chief Complaint Neck Injury Reason for Call Symptomatic / Request for [place] states the pt fell over the weekend and hurt his neck and wants to know what to do. Translation No Nurse Assessment Nurse: Olevia Bowens, RN, Lauren Date/Time Eilene Ghazi Time): 06/21/2019 2:28:49 PM Confirm and document reason for call. If symptomatic, describe symptoms. ---Caller states he fell over the weekend and hurt his neck and wants to know what to do. Was scheduled for Physical Therapy for history of stroke, but they were unable to do therapy due to neck pain. Was transferring from lift chair to commode- lost balance, grabbed chair and tipped over, patient fell and chair fell on top of him. Fell onto his left side (weak side) and rolled over onto his stomach. Reports rug burn on his knee. Wore a cervical collar last night to be able to go to sleep. No pain when holding neck still. Pain is 9/10 with turning his head. No shooting or radiating pain. Has the patient had close contact with a person known or suspected to have the novel coronavirus illness OR traveled / lives in area with major community spread (including international travel) in the last 14 days from the onset of symptoms? * If Asymptomatic, screen for exposure and travel within the last 14 days. ---No Does the patient have  any new or worsening symptoms? ---Yes Will a triage be completed? ---Yes Related visit to physician within the last 2 weeks? ---No Does the PT have any chronic conditions? (i.e. diabetes, asthma, this includes High risk factors for pregnancy, etc.) ---Yes List chronic conditions. ---Major stroke 5 years ago, Quadruple bypass 10 years ago. Used to have HTN and now is Hypotensive. Is this a behavioral health or substance abuse call? ---No PLEASE NOTE: All timestamps contained within this report are represented as [place] Standard Time. CONFIDENTIALTY NOTICE: This fax transmission is intended only for the addressee. It contains information that is legally privileged, confidential or otherwise protected from use or disclosure. If you are not the intended recipient, you are strictly prohibited from reviewing, disclosing, copying using or disseminating any of this information or taking any action in reliance on or regarding this information. If you have received this fax in error, please notify us immediately by telephone so that we can arrange for its return to [place]. Phone: [phone], Toll-Free: [phone], Fax: [phone] Page: 2 of 2 Call Id: [id number] Guidelines Guideline Title Affirmed Question Affirmed Notes Nurse Date/Time Eilene Ghazi Time) Neck Injury SEVERE neck pain (e.g., excruciating) Olevia Bowens, RN, Lauren 06/21/2019 2:32:37 PM Disp. Time Eilene Ghazi Time) Disposition Final User 06/21/2019 2:35:03 PM See HCP within 4 Hours (or PCP triage) Yes Olevia Bowens, RN, Lauren Caller Disagree/Comply Comply Caller Understands Yes PreDisposition Call Doctor Care Advice Given Per Guideline SEE HCP WITHIN 4 HOURS (OR PCP TRIAGE): * IF OFFICE WILL BE OPEN: You need to be seen within the next 3 or 4 hours. Call your doctor (or NP/PA) now or as soon as  the office opens. CALL BACK IF: * You become worse. CARE ADVICE given per Neck Injury (Adult) guideline. Referrals GO TO FACILITY UNDECIDED

## 2019-06-21 NOTE — ED Provider Notes (Signed)
[facility]    CSN: [id number] Arrival date & time: 06/21/19  1532      History   Chief Complaint Chief Complaint  Patient presents with  . Fall    HPI Gregory Wiley is a 69 y.o. male with extensive medical history as outlined below including hypertension, history of PE and CVA, anticoagulation on Eliquis, presenting for neck pain s/p fall.  States Saturday he fell after losing his balance, hit his head: Denies LOC.  Fall was unwitnessed.  Went to PT this morning wearing soft collar, so they told him he needed further evaluation.  Patient states he wear soft collar as the potholes when driving irritated his neck.  Patient denies headache, change in vision or hearing, tinnitus, dizziness, other areas of pain, worsening weakness or numbness: Patient ports left hemiparesis s/p CVA.  Has taken ibuprofen without relief.   Past Medical History:  Diagnosis Date  . Adrenal insufficiency ([place])   . Anxiety   . Basal cell carcinoma of skin of left ankle   . Bleeding stomach ulcer 2015  . Coronary artery disease    stent, then CABG 07/20/10  . Daily headache    "for the past month" (11/11/2014)  . Depression   . Diverticulosis   . GERD (gastroesophageal reflux disease)   . Gout   . H/O cardiomyopathy    ischemic, now last echo 07/30/11, EF 38%W  . History of hiatal hernia   . Hyperlipidemia   . Hypertension   . Internal hemorrhoids   . Left hemiplegia ([place])   . NSTEMI (non-ST elevated myocardial infarction) ([place])    NSTEMI- last cath 06/2011-Stent to LCX-DES, last nuc 07/30/11 low risk  . Obesity (BMI 30-39.9)   . OSA on CPAP    since July 2013- uses CPAP sometimes , states he has lost 147 lbs. since stroke & doesn't use the CPAP as much as he use to.   . Plantar fasciitis of left foot   . Pneumonia    hosp. 12/2014  . Pulmonary embolism ([place]) 03/2014  . Stroke ([place]) 01/2014   "no control of LUE, can slightly LLE; memory problems since" (11/11/2014)  . Tubular adenoma of  colon     Patient Active Problem List   Diagnosis Date Noted  . GERD (gastroesophageal reflux disease) 06/18/2019  . Tinea cruris 07/20/2017  . Hemorrhoid 07/20/2017  . Irritability 06/08/2017  . Fall at home 05/06/2017  . Advance care planning 11/28/2016  . Skin mass 11/28/2016  . Lower urinary tract symptoms (LUTS) 09/05/2016  . Hearing loss 09/05/2016  . Excessive daytime sleepiness 04/16/2016  . Exposure to TB 02/21/2016  . Vitamin D deficiency 02/21/2016  . Anemia 11/10/2015  . Left thigh pain 11/10/2015  . Neck pain 10/12/2015  . Hemiplegia affecting left side in left-dominant patient as late effect of cerebrovascular disease ([place]) 09/03/2015  . Medicare annual wellness visit, subsequent 08/14/2015  . Acute pulmonary embolism ([place]) 07/25/2015  . Chest wall pain 07/15/2015  . History of pulmonary embolism 07/15/2015  . Chronic anticoagulation 05/22/2015  . Gait disturbance 05/15/2015  . DNR (do not resuscitate) 01/19/2015  . Hypotension 01/14/2015  . Depression 12/14/2014  . OSA (obstructive sleep apnea) 11/30/2014  . Chronic combined systolic and diastolic congestive heart failure ([place]) 11/11/2014  . SOB (shortness of breath) 04/17/2014  . Hemiplegia affecting left dominant side ([place]) 02/16/2014  . 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 06/30/2011  Past Surgical History:  Procedure Laterality Date  . BASAL CELL CARCINOMA EXCISION Left 2015   ankle  . CARDIAC SURGERY    . CATARACT EXTRACTION Bilateral   . COLONOSCOPY N/A 02/10/2013   Procedure: COLONOSCOPY;  Surgeon: Ladene Artist, MD;  Location: [facility];  Service: Endoscopy;  Laterality: N/A;  . CORONARY ANGIOPLASTY WITH STENT PLACEMENT  07/01/2011   DES-Resolute to native LCX  . CORONARY ANGIOPLASTY WITH STENT PLACEMENT  12/01/2007   COMPLEX 5 LESION PCI INCLUDING CUTTING BALLOON AND 4 CYPHER DESs  . CORONARY ARTERY BYPASS GRAFT  07/20/2010     LIMA-LAD; VG-ACUTE MARG of RCA; Seq VG-distal RCA & then pda  . EP IMPLANTABLE DEVICE N/A 02/14/2016   Procedure: Loop Recorder Removal;  Surgeon: Thompson Grayer, MD;  Location: [facility];  Service: Cardiovascular;  Laterality: N/A;  . ESOPHAGOGASTRODUODENOSCOPY  01/2014   gastric ulcer, erosive gastroduodenitis, H Pylori negative.   . ESOPHAGOGASTRODUODENOSCOPY (EGD) WITH PROPOFOL N/A 11/14/2014   Procedure: ESOPHAGOGASTRODUODENOSCOPY (EGD) WITH PROPOFOL;  Surgeon: Milus Banister, MD;  Location: [place];  Service: Endoscopy;  Laterality: N/A;  . HAND SURGERY Right    to take glass out  . HERNIA REPAIR    . INTRAMEDULLARY (IM) NAIL INTERTROCHANTERIC Right 02/07/2015   Procedure: INTRAMEDULLARY (IM) NAIL INTERTROCHANTRIC RIGHT HIP;  Surgeon: Renette Butters, MD;  Location: [place];  Service: Orthopedics;  Laterality: Right;  . KNEE ARTHROSCOPY Right   . LEFT HEART CATHETERIZATION WITH CORONARY/GRAFT ANGIOGRAM  07/01/2011   Procedure: LEFT HEART CATHETERIZATION WITH Beatrix Fetters;  Surgeon: Sanda Klein, MD;  Location: [facility];  Service: Cardiovascular;;  . LOOP RECORDER IMPLANT  02/15/2014   MDT LINQ implanted by Dr Rayann Heman for cryptogenic stroke  . PERCUTANEOUS CORONARY STENT INTERVENTION (PCI-S) Right 07/01/2011   Procedure: PERCUTANEOUS CORONARY STENT INTERVENTION (PCI-S);  Surgeon: Sanda Klein, MD;  Location: [facility];  Service: Cardiovascular;  Laterality: Right;  . PICC LINE PLACE PERIPHERAL ([place] HX)  06/2015  . RETINAL DETACHMENT SURGERY Right   . TEE WITHOUT CARDIOVERSION N/A 02/15/2014   Procedure: TRANSESOPHAGEAL ECHOCARDIOGRAM (TEE);  Surgeon: Josue Hector, MD;  Location: [place];  Service: Cardiovascular;  Laterality: N/A;  . UMBILICAL HERNIA REPAIR  2006       Home Medications    Prior to Admission medications   Medication Sig Start Date End Date Taking? Authorizing Provider  acetaminophen (TYLENOL) 325 MG tablet Take 1 tablet (325 mg  total) by mouth every 6 (six) hours as needed for mild pain. 12/27/14   Florencia Reasons, MD  albuterol (PROVENTIL HFA;VENTOLIN HFA) 108 (90 Base) MCG/ACT inhaler Inhale 2 puffs into the lungs every 6 (six) hours as needed for wheezing or shortness of breath. 07/18/17   Tonia Ghent, MD  apixaban (ELIQUIS) 5 MG TABS tablet Take 1 tablet (5 mg total) by mouth 2 (two) times daily. 12/15/18   [name], MD  Armodafinil 200 MG TABS TAKE 1 TABLET BY MOUTH EVERY MORNING 05/05/19   Sater, Nanine Means, MD  cholecalciferol (VITAMIN D) 1000 units tablet Take 1,000 Units by mouth daily.    [provider]  [facility] IRON 325 (65 Fe) MG tablet TAKE 1 TABLET BY MOUTH TWICE A DAY WITH A MEAL 06/02/19   Tonia Ghent, MD  doxepin (SINEQUAN) 10 MG capsule TAKE 1 CAPSULE AT BEDTIME 05/05/19   Sater, Nanine Means, MD  ELDERBERRY PO Take by mouth. 3 a day    [provider]  fluticasone (  FLONASE) 50 MCG/ACT nasal spray Place 2 sprays into both nostrils daily. Patient taking differently: Place 2 sprays into both nostrils daily as needed.  12/11/16   Tonia Ghent, MD  gabapentin (NEURONTIN) 300 MG capsule Take 1 capsule (300 mg total) by mouth at bedtime. 10/08/18   Lomax, Amy, NP  HYDROcodone-acetaminophen (NORCO/VICODIN) 5-325 MG tablet Take 1 tablet by mouth daily as needed for moderate pain. 10/08/18   Lomax, Amy, NP  hydrocortisone (ANUSOL-HC) 25 MG suppository Place 1 suppository (25 mg total) rectally 2 (two) times daily as needed. X 1 week; then, use as needed thereafter. 07/18/17   Tonia Ghent, MD  KLOR-CON M10 10 MEQ tablet TAKE 1 TABLETS BY MOUTH 2 TIMES DAILY. 12/08/18   [provider]  Melatonin 10 MG CAPS TAKE 1 CAPSULE BY MOUTH AT BEDTIME AS NEEDED FOR SLEEP 12/08/18   Tonia Ghent, MD  midodrine (PROAMATINE) 2.5 MG tablet Take 1 tablet (2.5 mg total) by mouth 2 (two) times daily with a meal. 06/01/19   [name], MD  nitroGLYCERIN (NITROSTAT) 0.4 MG SL tablet Place 1 tablet (0.4  mg total) under the tongue every 5 (five) minutes as needed for chest pain. 07/18/17   Tonia Ghent, MD  NONFORMULARY OR COMPOUNDED ITEM Supplement menmoringa oleifera taken daily.    [provider]  nystatin (MYCOSTATIN/NYSTOP) powder Apply topically 2 (two) times daily. 07/18/17   Tonia Ghent, MD  OVER THE COUNTER MEDICATION Ardath Sax- takes 2 a day, 1200 mg    [provider]  pantoprazole (PROTONIX) 40 MG tablet Take 1 tablet (40 mg total) by mouth 2 (two) times daily. 06/17/19   Tonia Ghent, MD  potassium chloride (K-DUR) 10 MEQ tablet TAKE 1 TABLETS BY MOUTH 2 TIMES DAILY. 12/08/18   Tonia Ghent, MD  predniSONE (DELTASONE) 20 MG tablet Take 1 tablet (20 mg total) by mouth daily. 06/21/19   Hall-Potvin, [place], PA-C  PRESCRIPTION MEDICATION Inhale into the lungs at bedtime. CPAP    [provider]  prochlorperazine (COMPAZINE) 10 MG tablet Take 1 tablet (10 mg total) by mouth every 8 (eight) hours as needed (headache, nausea or vomiting). 03/19/16   Forde Dandy, MD  QC LO-DOSE ASPIRIN 81 MG EC tablet TAKE 1 TABLET (81 MG TOTAL) BY MOUTH DAILY. SWALLOW WHOLE. 12/11/17   [name], MD  QC STOOL SOFTENER PLS LAXATIVE 8.6-50 MG tablet TAKE 1 TABLET BY MOUTH 2 TIMES DAILY. 07/14/18   Tonia Ghent, MD  rosuvastatin (CRESTOR) 40 MG tablet Take 1 tablet (40 mg total) by mouth daily. 06/17/19   Tonia Ghent, MD  senna (SENOKOT) 8.6 MG tablet Take 1 tablet by mouth 2 (two) times daily.    [provider]  sertraline (ZOLOFT) 100 MG tablet TAKE 1 TABLET BY MOUTH EVERY DAY 04/08/19   Tonia Ghent, MD  tiZANidine (ZANAFLEX) 4 MG capsule Take 1 capsule (4 mg total) by mouth as needed. 03/06/17   Sater, Nanine Means, MD    Family History Family History  Problem Relation Age of Onset  . Diabetes type II Mother   . CAD Father 76       CABG, PCI, died at 87  . Heart attack Father 7  . Hypertension Brother   . Diabetes Brother   .  Hyperlipidemia Brother   . Brain cancer Maternal Grandmother   . COPD Paternal Grandfather   . Coronary artery disease Other   . Other Other  denies family history of DVT/PE  . Colon cancer Neg Hx   . Stroke Neg Hx   . Prostate cancer Neg Hx     Social History Social History   Tobacco Use  . Smoking status: Former Smoker    Packs/day: 2.00    Years: 4.00    Pack years: 8.00    Types: Cigarettes    Quit date: 01/30/1969    Years since quitting: 50.4  . Smokeless tobacco: Never Used  . Tobacco comment: "quit smoking cigarettes  in the 1970's"  Substance Use Topics  . Alcohol use: No    Alcohol/week: 0.0 standard drinks  . Drug use: No    Comment: "smoked some pot in college"     Allergies   Diphenhydramine hcl, Adhesive [tape], Proscar [finasteride], and Tamsulosin   Review of Systems As per HPI   Physical Exam Triage Vital Signs ED Triage Vitals  Enc Vitals Group     BP 06/21/19 1546 119/72     Pulse Rate 06/21/19 1546 89     Resp 06/21/19 1546 (!) 24     Temp 06/21/19 1546 98.2 F (36.8 C)     Temp Source 06/21/19 1546 Oral     SpO2 06/21/19 1546 96 %     Weight --      Height --      Head Circumference --      Peak Flow --      Pain Score 06/21/19 1543 10     Pain Loc --      Pain Edu? --      Excl. in [place]? --    No data found.  Updated Vital Signs BP 119/72 (BP Location: Right Arm)   Pulse 89   Temp 98.2 F (36.8 C) (Oral)   Resp (!) 24   SpO2 96%   Visual Acuity Right Eye Distance:   Left Eye Distance:   Bilateral Distance:    Right Eye Near:   Left Eye Near:    Bilateral Near:     Physical Exam Constitutional:      General: He is not in acute distress. HENT:     Head: Normocephalic and atraumatic.  Eyes:     General: No scleral icterus.    Pupils: Pupils are equal, round, and reactive to light.  Neck:     Comments: Decreased ROM of C-spine in all directions.  No spinous process tenderness of C-spine.  No right-sided neck  tenderness.  Left lateral neck tenderness without crepitus, edema, mass.  No bony tenderness in left shoulder.  Strength and ROM deferred given H/O hemiparesis. Cardiovascular:     Rate and Rhythm: Normal rate.  Pulmonary:     Effort: Pulmonary effort is normal. No respiratory distress.     Breath sounds: No wheezing.  Musculoskeletal:     Cervical back: Tenderness present. No rigidity.  Skin:    Coloration: Skin is not jaundiced or pale.  Neurological:     Mental Status: He is alert and oriented to person, place, and time.     Comments: Right-sided neuro exam unremarkable.  Left-sided neuro exam with decreased tone, reflexes: Patient has history of left-sided hemiparesis.      UC Treatments / Results  Labs (all labs ordered are listed, but only abnormal results are displayed) Labs Reviewed - No data to display  EKG   Radiology DG Cervical Spine Complete  Result Date: 06/21/2019 CLINICAL DATA:  Left side pain.  Fall. EXAM: CERVICAL SPINE - COMPLETE  4+ VIEW COMPARISON:  None. FINDINGS: Large flowing anterior osteophytes compatible with DISH. Mild degenerative facet disease bilaterally. Mild left neural foraminal narrowing at C3-4. Mild right neural foraminal narrowing at C3-4 and C4-5. No fracture or subluxation. Prevertebral soft tissues are normal. IMPRESSION: Changes of DISH. Degenerative changes as above. No acute bony abnormality. Electronically Signed   By: Rolm Baptise M.D.   On: 06/21/2019 17:25    Procedures Procedures (including critical care time)  Medications Ordered in UC Medications - No data to display  Initial Impression / Assessment and Plan / UC Course  I have reviewed the triage vital signs and the nursing notes.  Pertinent labs & imaging results that were available during my care of the patient were reviewed by me and considered in my medical decision making (see chart for details).     Patient without neurocognitive deficit, reports compliance with  Eliquis, though denies new bruises, bleeding, severe abdominal pain, blood in stool.  X-ray of C-spine done in office, reviewed by me radiology: Significant for changes of DISH.  No acute bony abnormality or malalignment of vertebral bodies.  Reviewed findings with patient verbalized understanding.  Will try prednisone, stretches, follow-up with PT and/or PCP to order further imaging if needed.  Return precautions discussed, patient verbalized understanding and is agreeable to plan. 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   Heat therapy (hot compress, warm wash red, hot showers, etc.) can help relax muscles and soothe muscle aches. Cold therapy (ice packs) can be used to help swelling both after injury and after prolonged use of areas of chronic pain/aches.  For pain: take prednisone once daily with breakfast    ED Prescriptions    Medication Sig Dispense Auth. Provider   predniSONE (DELTASONE) 20 MG tablet Take 1 tablet (20 mg total) by mouth daily. 5 tablet Hall-Potvin, [place], PA-C     PDMP not reviewed this encounter.   Hall-Potvin, [place], [place] 06/21/19 2006

## 2019-06-21 NOTE — ED Triage Notes (Signed)
Patient fell on Saturday.  Patient lost his balance.  Goes to physical therapy for foot drop.  Patient stood to get on bedside toilet, started to fall, grabbed chair.  Chair landed on top of him.  Complains of neck pain.  Physical therapy today said no treatments until neck evaluated.  pcp instructed to come to [facility].    Patient is wearing a soft collar from a previous injury

## 2019-06-21 NOTE — Telephone Encounter (Signed)
Noted, with eval going on currently, will route note as FYI.  I thank all involved.

## 2019-06-22 ENCOUNTER — Ambulatory Visit (INDEPENDENT_AMBULATORY_CARE_PROVIDER_SITE_OTHER): Payer: Medicare Other | Admitting: Podiatry

## 2019-06-22 ENCOUNTER — Encounter: Payer: Self-pay | Admitting: Podiatry

## 2019-06-22 VITALS — Temp 97.4°F

## 2019-06-22 DIAGNOSIS — M21372 Foot drop, left foot: Secondary | ICD-10-CM

## 2019-06-22 DIAGNOSIS — M79676 Pain in unspecified toe(s): Secondary | ICD-10-CM | POA: Diagnosis not present

## 2019-06-22 DIAGNOSIS — B351 Tinea unguium: Secondary | ICD-10-CM | POA: Diagnosis not present

## 2019-06-22 NOTE — Patient Instructions (Signed)
Ingrown Toenail An ingrown toenail occurs when the corner or sides of a toenail grow into the surrounding skin. This causes discomfort and pain. The big toe is most commonly affected, but any of the toes can be affected. If an ingrown toenail is not treated, it can become infected. What are the causes? This condition may be caused by:  Wearing shoes that are too small or tight.  An injury, such as stubbing your toe or having your toe stepped on.  Improper cutting or care of your toenails.  Having nail or foot abnormalities that were present from birth (congenital abnormalities), such as having a nail that is too big for your toe. What increases the risk? The following factors may make you more likely to develop ingrown toenails:  Age. Nails tend to get thicker with age, so ingrown nails are more common among older people.  Cutting your toenails incorrectly, such as cutting them very short or cutting them unevenly. An ingrown toenail is more likely to get infected if you have:  Diabetes.  Blood flow (circulation) problems. What are the signs or symptoms? Symptoms of an ingrown toenail may include:  Pain, soreness, or tenderness.  Redness.  Swelling.  Hardening of the skin that surrounds the toenail. Signs that an ingrown toenail may be infected include:  Fluid or pus.  Symptoms that get worse instead of better. How is this diagnosed? An ingrown toenail may be diagnosed based on your medical history, your symptoms, and a physical exam. If you have fluid or blood coming from your toenail, a sample may be collected to test for the specific type of bacteria that is causing the infection. How is this treated? Treatment depends on how severe your ingrown toenail is. You may be able to care for your toenail at home.  If you have an infection, you may be prescribed antibiotic medicines.  If you have fluid or pus draining from your toenail, your health care provider may drain  it.  If you have trouble walking, you may be given crutches to use.  If you have a severe or infected ingrown toenail, you may need a procedure to remove part or all of the nail. Follow these instructions at home: Foot care   Do not pick at your toenail or try to remove it yourself.  Soak your foot in warm, soapy water. Do this for 20 minutes, 3 times a day, or as often as told by your health care provider. This helps to keep your toe clean and keep your skin soft.  Wear shoes that fit well and are not too tight. Your health care provider may recommend that you wear open-toed shoes while you heal.  Trim your toenails regularly and carefully. Cut your toenails straight across to prevent injury to the skin at the corners of the toenail. Do not cut your nails in a curved shape.  Keep your feet clean and dry to help prevent infection. Medicines  Take over-the-counter and prescription medicines only as told by your health care provider.  If you were prescribed an antibiotic, take it as told by your health care provider. Do not stop taking the antibiotic even if you start to feel better. Activity  Return to your normal activities as told by your health care provider. Ask your health care provider what activities are safe for you.  Avoid activities that cause pain. General instructions  If your health care provider told you to use crutches to help you move around, use them   as instructed.  Keep all follow-up visits as told by your health care provider. This is important. Contact a health care provider if:  You have more redness, swelling, pain, or other symptoms that do not improve with treatment.  You have fluid, blood, or pus coming from your toenail. Get help right away if:  You have a red streak on your skin that starts at your foot and spreads up your leg.  You have a fever. Summary  An ingrown toenail occurs when the corner or sides of a toenail grow into the surrounding  skin. This causes discomfort and pain. The big toe is most commonly affected, but any of the toes can be affected.  If an ingrown toenail is not treated, it can become infected.  Fluid or pus draining from your toenail is a sign of infection. Your health care provider may need to drain it. You may be given antibiotics to treat the infection.  Trimming your toenails regularly and properly can help you prevent an ingrown toenail. This information is not intended to replace advice given to you by your health care provider. Make sure you discuss any questions you have with your health care provider. Document Revised: 06/26/2018 Document Reviewed: 11/20/2016 Elsevier Patient Education  2020 [employer] Inc. Onychomycosis/Fungal Toenails  WHAT IS IT? An infection that lies within the keratin of your nail plate that is caused by a fungus.  WHY ME? Fungal infections affect all ages, sexes, races, and creeds.  There may be many factors that predispose you to a fungal infection such as age, coexisting medical conditions such as diabetes, or an autoimmune disease; stress, medications, fatigue, genetics, etc.  Bottom line: fungus thrives in a warm, moist environment and your shoes offer such a location.  IS IT CONTAGIOUS? Theoretically, yes.  You do not want to share shoes, nail clippers or files with someone who has fungal toenails.  Walking around barefoot in the same room or sleeping in the same bed is unlikely to transfer the organism.  It is important to realize, however, that fungus can spread easily from one nail to the next on the same foot.  HOW DO WE TREAT THIS?  There are several ways to treat this condition.  Treatment may depend on many factors such as age, medications, pregnancy, liver and kidney conditions, etc.  It is best to ask your doctor which options are available to you.  1. No treatment.   Unlike many other medical concerns, you can live with this condition.  However for many people this  can be a painful condition and may lead to ingrown toenails or a bacterial infection.  It is recommended that you keep the nails cut short to help reduce the amount of fungal nail. 2. Topical treatment.  These range from herbal remedies to prescription strength nail lacquers.  About 40-50% effective, topicals require twice daily application for approximately 9 to 12 months or until an entirely new nail has grown out.  The most effective topicals are medical grade medications available through physicians offices. 3. Oral antifungal medications.  With an 80-90% cure rate, the most common oral medication requires 3 to 4 months of therapy and stays in your system for a year as the new nail grows out.  Oral antifungal medications do require blood work to make sure it is a safe drug for you.  A liver function panel will be performed prior to starting the medication and after the first month of treatment.  It is important   to have the blood work performed to avoid any harmful side effects.  In general, this medication safe but blood work is required. 4. Laser Therapy.  This treatment is performed by applying a specialized laser to the affected nail plate.  This therapy is noninvasive, fast, and non-painful.  It is not covered by insurance and is therefore, out of pocket.  The results have been very good with a 80-95% cure rate.  The [facility] is the only practice in the area to offer this therapy. 5. Permanent Nail Avulsion.  Removing the entire nail so that a new nail will not grow back.  

## 2019-06-22 NOTE — Telephone Encounter (Signed)
Noted.  See ER note.  Thanks.

## 2019-06-23 ENCOUNTER — Other Ambulatory Visit: Payer: Self-pay

## 2019-06-23 ENCOUNTER — Ambulatory Visit: Payer: Medicare Other | Attending: Neurology | Admitting: Physical Therapy

## 2019-06-23 DIAGNOSIS — M25672 Stiffness of left ankle, not elsewhere classified: Secondary | ICD-10-CM

## 2019-06-23 DIAGNOSIS — R2681 Unsteadiness on feet: Secondary | ICD-10-CM | POA: Diagnosis present

## 2019-06-23 DIAGNOSIS — R293 Abnormal posture: Secondary | ICD-10-CM

## 2019-06-23 DIAGNOSIS — R2689 Other abnormalities of gait and mobility: Secondary | ICD-10-CM

## 2019-06-23 DIAGNOSIS — I69354 Hemiplegia and hemiparesis following cerebral infarction affecting left non-dominant side: Secondary | ICD-10-CM

## 2019-06-23 DIAGNOSIS — R296 Repeated falls: Secondary | ICD-10-CM

## 2019-06-23 DIAGNOSIS — M6281 Muscle weakness (generalized): Secondary | ICD-10-CM

## 2019-06-23 DIAGNOSIS — M21372 Foot drop, left foot: Secondary | ICD-10-CM | POA: Diagnosis present

## 2019-06-23 NOTE — Therapy (Signed)
[facility] [address] [place], [place], [address] Phone: [phone]   Fax:  [phone]  Physical Therapy Treatment  Patient Details  Name: Gregory Wiley MRN: [id number] Date of Birth: [birth year] Referring Provider (PT): Felecia Shelling, Nanine Means, MD   Encounter Date: 06/23/2019  PT End of Session - 06/23/19 1612    Visit Number  7    Number of Visits  25    Date for PT Re-Evaluation  07/02/19    Authorization Type  [employer] Medicare - 10th visit PN    Progress Note Due on Visit  10    PT Start Time  1403    PT Stop Time  1446    PT Time Calculation (min)  43 min    Activity Tolerance  Patient limited by pain    Behavior During Therapy  [facility] for tasks assessed/performed       Past Medical History:  Diagnosis Date  . Adrenal insufficiency ([place])   . Anxiety   . Basal cell carcinoma of skin of left ankle   . Bleeding stomach ulcer 2015  . Coronary artery disease    stent, then CABG 07/20/10  . Daily headache    "for the past month" (11/11/2014)  . Depression   . Diverticulosis   . GERD (gastroesophageal reflux disease)   . Gout   . H/O cardiomyopathy    ischemic, now last echo 07/30/11, EF 38%W  . History of hiatal hernia   . Hyperlipidemia   . Hypertension   . Internal hemorrhoids   . Left hemiplegia ([place])   . NSTEMI (non-ST elevated myocardial infarction) ([place])    NSTEMI- last cath 06/2011-Stent to LCX-DES, last nuc 07/30/11 low risk  . Obesity (BMI 30-39.9)   . OSA on CPAP    since July 2013- uses CPAP sometimes , states he has lost 147 lbs. since stroke & doesn't use the CPAP as much as he use to.   . Plantar fasciitis of left foot   . Pneumonia    hosp. 12/2014  . Pulmonary embolism ([place]) 03/2014  . Stroke ([place]) 01/2014   "no control of LUE, can slightly LLE; memory problems since" (11/11/2014)  . Tubular adenoma of colon     Past Surgical History:  Procedure Laterality Date  . BASAL CELL CARCINOMA EXCISION  Left 2015   ankle  . CARDIAC SURGERY    . CATARACT EXTRACTION Bilateral   . COLONOSCOPY N/A 02/10/2013   Procedure: COLONOSCOPY;  Surgeon: Ladene Artist, MD;  Location: [facility];  Service: Endoscopy;  Laterality: N/A;  . CORONARY ANGIOPLASTY WITH STENT PLACEMENT  07/01/2011   DES-Resolute to native LCX  . CORONARY ANGIOPLASTY WITH STENT PLACEMENT  12/01/2007   COMPLEX 5 LESION PCI INCLUDING CUTTING BALLOON AND 4 CYPHER DESs  . CORONARY ARTERY BYPASS GRAFT  07/20/2010    LIMA-LAD; VG-ACUTE MARG of RCA; Seq VG-distal RCA & then pda  . EP IMPLANTABLE DEVICE N/A 02/14/2016   Procedure: Loop Recorder Removal;  Surgeon: Thompson Grayer, MD;  Location: [facility] CV LAB;  Service: Cardiovascular;  Laterality: N/A;  . ESOPHAGOGASTRODUODENOSCOPY  01/2014   gastric ulcer, erosive gastroduodenitis, H Pylori negative.   . ESOPHAGOGASTRODUODENOSCOPY (EGD) WITH PROPOFOL N/A 11/14/2014   Procedure: ESOPHAGOGASTRODUODENOSCOPY (EGD) WITH PROPOFOL;  Surgeon: Milus Banister, MD;  Location: [facility];  Service: Endoscopy;  Laterality: N/A;  . HAND SURGERY Right    to take glass out  . HERNIA REPAIR    . INTRAMEDULLARY (IM)  NAIL INTERTROCHANTERIC Right 02/07/2015   Procedure: INTRAMEDULLARY (IM) NAIL INTERTROCHANTRIC RIGHT HIP;  Surgeon: Renette Butters, MD;  Location: [facility];  Service: Orthopedics;  Laterality: Right;  . KNEE ARTHROSCOPY Right   . LEFT HEART CATHETERIZATION WITH CORONARY/GRAFT ANGIOGRAM  07/01/2011   Procedure: LEFT HEART CATHETERIZATION WITH Beatrix Fetters;  Surgeon: Sanda Klein, MD;  Location: [facility] CATH LAB;  Service: Cardiovascular;;  . LOOP RECORDER IMPLANT  02/15/2014   MDT LINQ implanted by Dr Rayann Heman for cryptogenic stroke  . PERCUTANEOUS CORONARY STENT INTERVENTION (PCI-S) Right 07/01/2011   Procedure: PERCUTANEOUS CORONARY STENT INTERVENTION (PCI-S);  Surgeon: Sanda Klein, MD;  Location: [facility] CATH LAB;  Service: Cardiovascular;  Laterality: Right;  . PICC LINE PLACE  PERIPHERAL ([place] H[facility])  06/2015  . RETINAL DETACHMENT SURGERY Right   . TEE WITHOUT CARDIOVERSION N/A 02/15/2014   Procedure: TRANSESOPHAGEAL ECHOCARDIOGRAM (TEE);  Surgeon: Josue Hector, MD;  Location: [facility] ENDOSCOPY;  Service: Cardiovascular;  Laterality: N/A;  . UMBILICAL HERNIA REPAIR  2006    There were no vitals filed for this visit.  Subjective Assessment - 06/23/19 1551    Subjective  Pt did go to urgent care and had xray performed.  No acute findings and pt cleared to return to therapy by PCP and neurology since there was no LOC.  Pt. demonstrating some improvement in pain today, not wearing soft collar.  No further falls.  Went to podiatrist to have nails cut and podiatrist recommended a new AFO which will be done in his office.  Wife asking what PT thinks about a new AFO.    Patient is accompained by:  Family member   wife   Pertinent History  DNR; anxiety & depression, CAD with CABG, diverticulosis, gout, HLD, HTN, left hemiplegia, NSTEMI (2013), morbid obesity, PE (2016), CVA (2015), tubular adenoma of colon, OSA w/CPAP, right hip fx w/ORIF (2016), iron deficiency anemia    Limitations  Standing;Walking    Patient Stated Goals  to obtain a Bioness for home use; to return to PLOF of ambulating household distances and short community distances with quad cane    Currently in Pain?  Yes    Pain Score  8     Pain Location  Neck    Pain Orientation  Left    Pain Descriptors / Indicators  Sore                       [facility] Adult PT Treatment/Exercise - 06/23/19 1557      Therapeutic Activites    Therapeutic Activities  Other Therapeutic Activities    Other Therapeutic Activities  While pt performed ROM, strengthening and aerobic conditioning on SCI Fit discussed AFO.  Pt has off the shelf carbon Ottobock ground reaction AFO from when he was in therapy right after his CVA - pt does not feel this AFO was helpful and has not been using it.  Discussed limitations of this type  of brace especially now with the late effects of the CVA.  Discussed that pt would likely be more appropriate for a custom molded AFO but that a custom AFO would be heavier and hard to don if he has any edema in his LLE.  Discussed that it would provide greater lateral ankle stability and greater knee stability during stance phase.  Donned Ottobock to assess effectiveness in standing today.  Unable to seat heel completely in shoe with AFO and pt demonstrated increased ankle supination and plantarflexor tone when wearing ground reaction  AFO further limiting patient's ability to fully WB through LLE.  Discussed with pt and wife that custom AFO may need a strap to help with reducing ankle supination.        Exercises   Exercises  Other Exercises    Other Exercises   In standing with ground reaction AFO donned and UE support on // bars placed L foot forwards and performed static lunges flexing L knee while therapist maintained L ankle position on floor to increase closed chain ankle DF ROM.  Also performed at // bars LLE ABD and lateral stepping to L - pt required significant assistance to initiate ABD so placed crate beside L foot as visual target to push - pt still required significant assistance to initiate without ER.  Attempted 2 sets x 3 reps with seated rest breaks due to back pain.  Returned to standing and performed stance phase training on LLE focusing on weight shift to L and maintaining while RLE performed 10 reps tap ups to 2" step.        Knee/Hip Exercises: Aerobic   Other Aerobic  SCI Fit stepper with pt seated in w/c at level 1.5 x 10 minutes with bilat LE and RUE.  Initially had belt around knees to maintain neutral L hip rotation but pt reporting this was significantly limiting his flexion ROM.  Removed belt - pt able to perform greater ROM into hip and knee flexion but with excessive hip external rotation and abduction.  Performed for 1-2 minutes with LE only for extensor strengthening.               PT Education - 06/23/19 1612    Education Details  AFO recommendations    Person(s) Educated  Patient;Spouse    Methods  Explanation    Comprehension  Verbalized understanding       PT Short Term Goals - 06/03/19 2053      PT SHORT TERM GOAL #1   Title  Patient and wife demonstrate independence with initial LE stretching HEP    Baseline  2nd visit - no HEP set currently delay in patient care due to [place] and wife's eye surgery    Time  4    Period  Weeks    Status  Not Met      PT SHORT TERM GOAL #2   Title  Patient participates in TUG assessment.    Baseline  Pt not ambulatory at this time    Time  4    Period  Weeks    Status  Not Met      PT SHORT TERM GOAL #3   Title  Pt tolerates use of Bioness unit for transfer training with quad cane and min A    Baseline  Mod-max A for transfers - not appropriate for Bioness at this time    Time  4    Period  Weeks    Status  Not Met      PT SHORT TERM GOAL #4   Title  Patient tolerates wear of bioness unit for TUG and gait velocity assessment with Min A    Baseline  not appropriate for Bioness at this time    Time  4    Period  Weeks    Status  Deferred      PT SHORT TERM GOAL #5   Title  Pt will improve gait velocity with quad cane and most appropriate orthosis to >/=0.5 ft/sec    Baseline  not ambulatory  at this time    Time  4    Period  Weeks    Status  Not Met        PT Long Term Goals - 06/03/19 2056      PT LONG TERM GOAL #1   Title  Patient and wife demonstrate and verbalizes independence with initial HEP. (all LTGs target date: 07/02/2019)    Time  12    Period  Weeks    Status  Revised      PT LONG TERM GOAL #2   Title  Patient will improve standing balance and endurance to be able to participate in falls risk assessment with TUG with LRAD    Time  12    Period  Weeks    Status  Revised      PT LONG TERM GOAL #3   Title  Patient will improve gait velocity with quad cane and most  appropriate orthosis (bioness or AFO) to >0.4 ft/sec    Baseline  .12 ft/sec    Time  12    Period  Weeks    Status  Revised      PT LONG TERM GOAL #4   Title  Patient demonstrates improved L passive ankle DF by at least 10 degrees.    Baseline  lacking 24 deg with knee extended, lacking 10 deg with knee flexed    Time  12    Period  Weeks    Status  New      PT LONG TERM GOAL #5   Title  Pt will demonstrate ability to transfer w/c <> mat/chair with quad cane, most appropriate orthosis and supervision    Time  12    Period  Weeks    Status  New            Plan - 06/23/19 1613    Clinical Impression Statement  Pt returned and was able to tolerate therapy today.  Discussed AFO recommendations - pros and cons and continued to focus on functional LLE ROM, strengthening in standing as well as postural control, weight shifting and activation of hip IR and abductor muscles.  Required frequent rest breaks due to back pain.  Pt is open to custom molded AFO.  Will continue to address and progress towards LTG.    Personal Factors and Comorbidities  Age;Fitness;Past/Current Experience;Comorbidity 3+;Time since onset of injury/illness/exacerbation    Comorbidities  anixety & depression, CAD, diverticulosis, gout, HLD, HTN, left hemiplegia, NSTEMI (2013), morbid obesity, PE (2016), CVA (2015), tubular adenoma of colon, OSA w/CPAP, right hip fx w/ORIF (2016), iron deficiency anemia    Examination-Activity Limitations  Squat;Stairs;Stand;Dressing;Transfers;Bed Mobility;Locomotion Level    Examination-Participation Restrictions  Church;Community Activity    Stability/Clinical Decision Making  Evolving/Moderate complexity    Rehab Potential  Good    PT Frequency  2x / week    PT Duration  12 weeks    PT Treatment/Interventions  ADLs/Self Care Home Management;Electrical Stimulation;DME Instruction;Balance training;Therapeutic exercise;Orthotic Fit/Training;Gait training;Neuromuscular  re-education;Stair training;Functional mobility training;Patient/family education;Therapeutic activities;Manual techniques;Compression bandaging;Passive range of motion    PT Next Visit Plan  **DNR** assess vitals throughout session. DO NOT feel pt is appropriate for Bioness at this time. GIVE written recommendations for custom molded AFO.  Seated therapy ball roll outs and sit > stand; Supine and sidelying - Needs to work on using hip ABD and hip flexion for LE advancement and not hip ER.  Needs to work on weight shifting and WB/sequencing of sit <> stand and  stand pivot transfers; side stepping to L.  NMR postural control/weight shifting. TUG when appropriate.  SciFit with bilat LE and RUE?    Consulted and Agree with Plan of Care  Patient;Family member/caregiver    Family Member Consulted  spouse       Patient will benefit from skilled therapeutic intervention in order to improve the following deficits and impairments:  Abnormal gait, Decreased range of motion, Difficulty walking, Impaired tone, Obesity, Impaired UE functional use, Decreased endurance, Decreased activity tolerance, Decreased skin integrity, Decreased balance, Decreased knowledge of use of DME, Impaired flexibility, Postural dysfunction, Impaired sensation, Increased edema, Decreased strength, Decreased mobility  Visit Diagnosis: Repeated falls  Other abnormalities of gait and mobility  Muscle weakness (generalized)  Unsteadiness on feet  Abnormal posture  Foot drop, left  Spastic hemiplegia of left nondominant side as late effect of cerebral infarction (HCC)  Stiffness of left ankle, not elsewhere classified     Problem List Patient Active Problem List   Diagnosis Date Noted  . GERD (gastroesophageal reflux disease) 06/18/2019  . Tinea cruris 07/20/2017  . Hemorrhoid 07/20/2017  . Irritability 06/08/2017  . Fall at home 05/06/2017  . Advance care planning 11/28/2016  . Skin mass 11/28/2016  . Lower urinary  tract symptoms (LUTS) 09/05/2016  . Hearing loss 09/05/2016  . Excessive daytime sleepiness 04/16/2016  . Exposure to TB 02/21/2016  . Vitamin D deficiency 02/21/2016  . Anemia 11/10/2015  . Left thigh pain 11/10/2015  . Neck pain 10/12/2015  . Hemiplegia affecting left side in left-dominant patient as late effect of cerebrovascular disease ([place]) 09/03/2015  . Medicare annual wellness visit, subsequent 08/14/2015  . Acute pulmonary embolism ([place]) 07/25/2015  . Chest wall pain 07/15/2015  . History of pulmonary embolism 07/15/2015  . Chronic anticoagulation 05/22/2015  . Gait disturbance 05/15/2015  . DNR (do not resuscitate) 01/19/2015  . Hypotension 01/14/2015  . Depression 12/14/2014  . OSA (obstructive sleep apnea) 11/30/2014  . Chronic combined systolic and diastolic congestive heart failure ([place]) 11/11/2014  . SOB (shortness of breath) 04/17/2014  . Hemiplegia affecting left dominant side ([place]) 02/16/2014  . 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 06/30/2011    [name], PT, DPT 06/23/19    4:23 PM    [facility] [address] [place], [place], [address] Phone: [phone]   Fax:  [phone]  Name: Gregory Wiley MRN: [id number] Date of Birth: [birth year]

## 2019-06-23 NOTE — Telephone Encounter (Signed)
See mychart message.  Please start a PA on protonix.   Thanks.

## 2019-06-23 NOTE — Progress Notes (Signed)
Per Dr. Felecia Shelling if no LOC, then okay to return to PT.  I agree, assuming pain level or current disability doesn't prevent return to PT.  I thank all involved.    Gregory Wiley 06/23/19 11:14 AM

## 2019-06-24 ENCOUNTER — Ambulatory Visit
Admission: RE | Admit: 2019-06-24 | Discharge: 2019-06-24 | Disposition: A | Discharge status: Home / Self Care | Payer: Medicare Other | Source: Ambulatory Visit | Attending: Family Medicine | Admitting: Family Medicine

## 2019-06-24 ENCOUNTER — Telehealth: Payer: Self-pay | Admitting: *Deleted

## 2019-06-24 DIAGNOSIS — Z8673 Personal history of transient ischemic attack (TIA), and cerebral infarction without residual deficits: Secondary | ICD-10-CM

## 2019-06-24 DIAGNOSIS — H539 Unspecified visual disturbance: Secondary | ICD-10-CM

## 2019-06-24 NOTE — Telephone Encounter (Signed)
PA for Protonix submitted thru [facility], awaiting response.

## 2019-06-28 ENCOUNTER — Ambulatory Visit: Payer: Medicare Other | Admitting: Physical Therapy

## 2019-06-28 ENCOUNTER — Encounter: Payer: Self-pay | Admitting: Physical Therapy

## 2019-06-28 ENCOUNTER — Other Ambulatory Visit: Payer: Self-pay

## 2019-06-28 DIAGNOSIS — M21372 Foot drop, left foot: Secondary | ICD-10-CM

## 2019-06-28 DIAGNOSIS — R2681 Unsteadiness on feet: Secondary | ICD-10-CM

## 2019-06-28 DIAGNOSIS — M6281 Muscle weakness (generalized): Secondary | ICD-10-CM

## 2019-06-28 DIAGNOSIS — I69354 Hemiplegia and hemiparesis following cerebral infarction affecting left non-dominant side: Secondary | ICD-10-CM

## 2019-06-28 DIAGNOSIS — R293 Abnormal posture: Secondary | ICD-10-CM

## 2019-06-28 DIAGNOSIS — R2689 Other abnormalities of gait and mobility: Secondary | ICD-10-CM

## 2019-06-28 DIAGNOSIS — R296 Repeated falls: Secondary | ICD-10-CM

## 2019-06-28 NOTE — Therapy (Signed)
[facility] [address] [place], [place], [address] Phone: [phone]   Fax:  [phone]  Physical Therapy Treatment  Patient Details  Name: Gregory Wiley MRN: [id number] Date of Birth: December 30, 1950 Referring Provider (PT): Felecia Shelling, Nanine Means, MD   Encounter Date: 06/28/2019  PT End of Session - 06/28/19 1447    Visit Number  8    Number of Visits  25    Date for PT Re-Evaluation  07/02/19    Authorization Type  [employer] Medicare - 10th visit PN    Progress Note Due on Visit  10    PT Start Time  1330    PT Stop Time  1415    PT Time Calculation (min)  45 min    Activity Tolerance  Patient tolerated treatment well    Behavior During Therapy  [facility] for tasks assessed/performed       Past Medical History:  Diagnosis Date  . Adrenal insufficiency ([place])   . Anxiety   . Basal cell carcinoma of skin of left ankle   . Bleeding stomach ulcer 2015  . Coronary artery disease    stent, then CABG 07/20/10  . Daily headache    "for the past month" (11/11/2014)  . Depression   . Diverticulosis   . GERD (gastroesophageal reflux disease)   . Gout   . H/O cardiomyopathy    ischemic, now last echo 07/30/11, EF 38%W  . History of hiatal hernia   . Hyperlipidemia   . Hypertension   . Internal hemorrhoids   . Left hemiplegia ([place])   . NSTEMI (non-ST elevated myocardial infarction) ([place])    NSTEMI- last cath 06/2011-Stent to LCX-DES, last nuc 07/30/11 low risk  . Obesity (BMI 30-39.9)   . OSA on CPAP    since July 2013- uses CPAP sometimes , states he has lost 147 lbs. since stroke & doesn't use the CPAP as much as he use to.   . Plantar fasciitis of left foot   . Pneumonia    hosp. 12/2014  . Pulmonary embolism ([place]) 03/2014  . Stroke ([place]) 01/2014   "no control of LUE, can slightly LLE; memory problems since" (11/11/2014)  . Tubular adenoma of colon     Past Surgical History:  Procedure Laterality Date  . BASAL CELL CARCINOMA  EXCISION Left 2015   ankle  . CARDIAC SURGERY    . CATARACT EXTRACTION Bilateral   . COLONOSCOPY N/A 02/10/2013   Procedure: COLONOSCOPY;  Surgeon: Ladene Artist, MD;  Location: [facility] ENDOS[facility];  Service: Endoscopy;  Laterality: N/A;  . CORONARY ANGIOPLASTY WITH STENT PLACEMENT  07/01/2011   DES-Resolute to native LCX  . CORONARY ANGIOPLASTY WITH STENT PLACEMENT  12/01/2007   COMPLEX 5 LESION PCI INCLUDING CUTTING BALLOON AND 4 CYPHER DESs  . CORONARY ARTERY BYPASS GRAFT  07/20/2010    LIMA-LAD; VG-ACUTE MARG of RCA; Seq VG-distal RCA & then pda  . EP IMPLANTABLE DEVICE N/A 02/14/2016   Procedure: Loop Recorder Removal;  Surgeon: Thompson Grayer, MD;  Location: [facility] CV LAB;  Service: Cardiovascular;  Laterality: N/A;  . ESOPHAGOGASTRODUODENOSCOPY  01/2014   gastric ulcer, erosive gastroduodenitis, H Pylori negative.   . ESOPHAGOGASTRODUODENOSCOPY (EGD) WITH PROPOFOL N/A 11/14/2014   Procedure: ESOPHAGOGASTRODUODENOSCOPY (EGD) WITH PROPOFOL;  Surgeon: Milus Banister, MD;  Location: [facility];  Service: Endoscopy;  Laterality: N/A;  . HAND SURGERY Right    to take glass out  . HERNIA REPAIR    . INTRAMEDULLARY (IM)  NAIL INTERTROCHANTERIC Right 02/07/2015   Procedure: INTRAMEDULLARY (IM) NAIL INTERTROCHANTRIC RIGHT HIP;  Surgeon: Renette Butters, MD;  Location: [facility];  Service: Orthopedics;  Laterality: Right;  . KNEE ARTHROSCOPY Right   . LEFT HEART CATHETERIZATION WITH CORONARY/GRAFT ANGIOGRAM  07/01/2011   Procedure: LEFT HEART CATHETERIZATION WITH Beatrix Fetters;  Surgeon: Sanda Klein, MD;  Location: [facility] CATH LAB;  Service: Cardiovascular;;  . LOOP RECORDER IMPLANT  02/15/2014   MDT LINQ implanted by Dr Rayann Heman for cryptogenic stroke  . PERCUTANEOUS CORONARY STENT INTERVENTION (PCI-S) Right 07/01/2011   Procedure: PERCUTANEOUS CORONARY STENT INTERVENTION (PCI-S);  Surgeon: Sanda Klein, MD;  Location: [facility] CATH LAB;  Service: Cardiovascular;  Laterality: Right;  . PICC LINE  PLACE PERIPHERAL ([place] HX)  06/2015  . RETINAL DETACHMENT SURGERY Right   . TEE WITHOUT CARDIOVERSION N/A 02/15/2014   Procedure: TRANSESOPHAGEAL ECHOCARDIOGRAM (TEE);  Surgeon: Josue Hector, MD;  Location: [facility] ENDOSCOPY;  Service: Cardiovascular;  Laterality: N/A;  . UMBILICAL HERNIA REPAIR  2006    There were no vitals filed for this visit.  Subjective Assessment - 06/28/19 1339    Subjective  Had some severe diarrhea last night into today; not uncommon for pt per wife.  No fever, headache, nausea or vomiting.  Feeling weak today.    Patient is accompained by:  Family member   wife   Pertinent History  DNR; anxiety & depression, CAD with CABG, diverticulosis, gout, HLD, HTN, left hemiplegia, NSTEMI (2013), morbid obesity, PE (2016), CVA (2015), tubular adenoma of colon, OSA w/CPAP, right hip fx w/ORIF (2016), iron deficiency anemia    Limitations  Standing;Walking    Patient Stated Goals  to obtain a Bioness for home use; to return to PLOF of ambulating household distances and short community distances with quad cane                       [facility] Adult PT Treatment/Exercise - 06/28/19 1414      Bed Mobility   Bed Mobility  Right Sidelying to Sit;Sit to Sidelying Right    Right Sidelying to Sit  Moderate Assistance - Patient 50-74%    Sit to Sidelying Right  Moderate Assistance - Patient 50-74%      Transfers   Transfers  Sit to Stand;Stand to Sit;Stand Pivot Transfers    Sit to Stand  3: Mod assist    Sit to Stand Details (indicate cue type and reason)  assistance to place L foot under COG and stabilize LLE in neutral hip rotation; also required assistance to shift COG fully anterior over BOS; performed from w/c, arm chair and mat    Stand to Sit  3: Mod assist    Stand to Sit Details  assistance to control descent    Stand Pivot Transfers  4: Min assist;3: Mod assist    Stand Pivot Transfer Details (indicate cue type and reason)  With quad cane, Min A to pivot to R  with assistance only to maintain upright posture and weight shift fully over LLE; when pivoting to L pt required mod A to assist with LLE placement and side stepping without hip ER; cues to fully shift weight over LLE during stance phase    Comments  performed w/c <> mat to R and L and then in // bars after ambulating pt performed full pivot to sit in w/c      Ambulation/Gait   Ambulation/Gait  Yes    Ambulation/Gait Assistance  3: Mod assist  Ambulation/Gait Assistance Details  Ambulated the length of the // bars, sat for rest break and then returned to w/c.  Therapist provided facilitation to maintain upright trunk, for positioning of L foot to maintain wider BOS and to prevent hip ER; also provided cues for full weight shift to LLE during stance phase.  Pt able to initiate LLE swing with hip and knee flexion with decreased hip ER    Ambulation Distance (Feet)  6 Feet   x 2   Assistive device  Parallel bars    Gait Pattern  Step-through pattern;Decreased stance time - left;Decreased stride length;Decreased hip/knee flexion - left;Decreased dorsiflexion - left;Decreased weight shift to left;Narrow base of support    Ambulation Surface  Level;Indoor      Knee/Hip Exercises: Stretches   Piriformis Stretch  Left;3 reps;60 seconds    Piriformis Stretch Limitations  In Right sidelying with therapist assisting with moving LLE into position and holding      Knee/Hip Exercises: Sidelying   Hip ABduction  Strengthening;Left;2 sets;10 reps    Hip ABduction Limitations  Therapist assisted with maintaining neutral hip position and knee extension; pt only able to initiate movement through first 25% of full ROM    Hip ADduction  Strengthening;Left;1 set;10 reps    Hip ADduction Limitations  pillow squeezes in R sidelying     Clams  Following adduction activation changed to reverse clams focusing on hip Adduction and IR with cues to lift top foot off bottom foot keeping knees together; performed 10 reps     Other Sidelying Knee/Hip Exercises  gravity minimized hip flexion with knee flexion with therapist supporting to maintain thigh in alignment with hip - performed x 10 reps with no evidence of compensatory hip ER               PT Short Term Goals - 06/03/19 2053      PT SHORT TERM GOAL #1   Title  Patient and wife demonstrate independence with initial LE stretching HEP    Baseline  2nd visit - no HEP set currently delay in patient care due to COVID and wife's eye surgery    Time  4    Period  Weeks    Status  Not Met      PT SHORT TERM GOAL #2   Title  Patient participates in TUG assessment.    Baseline  Pt not ambulatory at this time    Time  4    Period  Weeks    Status  Not Met      PT SHORT TERM GOAL #3   Title  Pt tolerates use of Bioness unit for transfer training with quad cane and min A    Baseline  Mod-max A for transfers - not appropriate for Bioness at this time    Time  4    Period  Weeks    Status  Not Met      PT SHORT TERM GOAL #4   Title  Patient tolerates wear of bioness unit for TUG and gait velocity assessment with Min A    Baseline  not appropriate for Bioness at this time    Time  4    Period  Weeks    Status  Deferred      PT SHORT TERM GOAL #5   Title  Pt will improve gait velocity with quad cane and most appropriate orthosis to >/=0.5 ft/sec    Baseline  not ambulatory at this time  Time  4    Period  Weeks    Status  Not Met        PT Long Term Goals - 06/03/19 2056      PT LONG TERM GOAL #1   Title  Patient and wife demonstrate and verbalizes independence with initial HEP. (all LTGs target date: 07/02/2019)    Time  12    Period  Weeks    Status  Revised      PT LONG TERM GOAL #2   Title  Patient will improve standing balance and endurance to be able to participate in falls risk assessment with TUG with LRAD    Time  12    Period  Weeks    Status  Revised      PT LONG TERM GOAL #3   Title  Patient will improve gait  velocity with quad cane and most appropriate orthosis (bioness or AFO) to >0.4 ft/sec    Baseline  .12 ft/sec    Time  12    Period  Weeks    Status  Revised      PT LONG TERM GOAL #4   Title  Patient demonstrates improved L passive ankle DF by at least 10 degrees.    Baseline  lacking 24 deg with knee extended, lacking 10 deg with knee flexed    Time  12    Period  Weeks    Status  New      PT LONG TERM GOAL #5   Title  Pt will demonstrate ability to transfer w/c <> mat/chair with quad cane, most appropriate orthosis and supervision    Time  12    Period  Weeks    Status  New            Plan - 06/28/19 1447    Clinical Impression Statement  Pt able to participate in session today despite digestive issues - required one trip to bathroom but then was able to continue.  Continued to focus on stand pivot transfer training to L and R, exercises in sidelying to minimize effect of gravity for strengthening but use of gravity to stretch hip into more neutral rotation to prepare for gait training in // bars where pt has greater support.  Pt tolerated well with decreased pain in hip, no neck pain and decreased anxiety with ambulation.    Personal Factors and Comorbidities  Age;Fitness;Past/Current Experience;Comorbidity 3+;Time since onset of injury/illness/exacerbation    Comorbidities  anixety & depression, CAD, diverticulosis, gout, HLD, HTN, left hemiplegia, NSTEMI (2013), morbid obesity, PE (2016), CVA (2015), tubular adenoma of colon, OSA w/CPAP, right hip fx w/ORIF (2016), iron deficiency anemia    Examination-Activity Limitations  Squat;Stairs;Stand;Dressing;Transfers;Bed Mobility;Locomotion Level    Examination-Participation Restrictions  Church;Community Activity    Stability/Clinical Decision Making  Evolving/Moderate complexity    Rehab Potential  Good    PT Frequency  2x / week    PT Duration  12 weeks    PT Treatment/Interventions  ADLs/Self Care Home Management;Electrical  Stimulation;DME Instruction;Balance training;Therapeutic exercise;Orthotic Fit/Training;Gait training;Neuromuscular re-education;Stair training;Functional mobility training;Patient/family education;Therapeutic activities;Manual techniques;Compression bandaging;Passive range of motion    PT Next Visit Plan  **DNR** assess vitals throughout session. DO NOT feel pt is appropriate for Bioness at this time. GIVE written recommendations for custom molded AFO.  Sidelying stretches and strengthening exercises for LLE.  gait in // bars.  Sit <> stand training on steady with keeping knees in neutral rotation?    Consulted and Agree with Plan  of Care  Patient;Family member/caregiver    Family Member Consulted  spouse       Patient will benefit from skilled therapeutic intervention in order to improve the following deficits and impairments:  Abnormal gait, Decreased range of motion, Difficulty walking, Impaired tone, Obesity, Impaired UE functional use, Decreased endurance, Decreased activity tolerance, Decreased skin integrity, Decreased balance, Decreased knowledge of use of DME, Impaired flexibility, Postural dysfunction, Impaired sensation, Increased edema, Decreased strength, Decreased mobility  Visit Diagnosis: Repeated falls  Other abnormalities of gait and mobility  Muscle weakness (generalized)  Unsteadiness on feet  Abnormal posture  Foot drop, left  Spastic hemiplegia of left nondominant side as late effect of cerebral infarction [facility])     Problem List Patient Active Problem List   Diagnosis Date Noted  . GERD (gastroesophageal reflux disease) 06/18/2019  . Tinea cruris 07/20/2017  . Hemorrhoid 07/20/2017  . Irritability 06/08/2017  . Fall at home 05/06/2017  . Advance care planning 11/28/2016  . Skin mass 11/28/2016  . Lower urinary tract symptoms (LUTS) 09/05/2016  . Hearing loss 09/05/2016  . Excessive daytime sleepiness 04/16/2016  . Exposure to TB 02/21/2016  . Vitamin D  deficiency 02/21/2016  . Anemia 11/10/2015  . Left thigh pain 11/10/2015  . Neck pain 10/12/2015  . Hemiplegia affecting left side in left-dominant patient as late effect of cerebrovascular disease ([place]) 09/03/2015  . Medicare annual wellness visit, subsequent 08/14/2015  . Acute pulmonary embolism ([place]) 07/25/2015  . Chest wall pain 07/15/2015  . History of pulmonary embolism 07/15/2015  . Chronic anticoagulation 05/22/2015  . Gait disturbance 05/15/2015  . DNR (do not resuscitate) 01/19/2015  . Hypotension 01/14/2015  . Depression 12/14/2014  . OSA (obstructive sleep apnea) 11/30/2014  . Chronic combined systolic and diastolic congestive heart failure ([place]) 11/11/2014  . SOB (shortness of breath) 04/17/2014  . Hemiplegia affecting left dominant side ([place]) 02/16/2014  . 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 06/30/2011    [name], PT, DPT 06/28/19    2:51 PM    [facility] [address] [place], [place], [address] Phone: [phone]   Fax:  [phone]  Name: Gregory Wiley MRN: [id number] Date of Birth: 1951-03-17

## 2019-06-28 NOTE — Progress Notes (Signed)
Subjective: Gregory Wiley presents today for follow up of painful mycotic nails b/l that are difficult to trim. Pain interferes with ambulation. Aggravating factors include wearing enclosed shoe gear. Pain is relieved with periodic professional debridement.   He is s/p CVA with left sided hemiplegia. He is also on blood thinner, Eliquis.   His wife is present during the visit.  Gregory Wiley has had temporary nail avulsion of left great toe performed on last visit.  Allergies  Allergen Reactions  . Diphenhydramine Hcl Anaphylaxis  . Adhesive [Tape] Other (See Comments)    Tears skin - please use paper tape  . Proscar [Finasteride] Other (See Comments)    Lightheaded.   . Tamsulosin Other (See Comments)    Dizzy, vomiting.      Objective: Vitals:   06/22/19 1321  Temp: (!) 97.4 F (36.3 C)    Pt 69 y.o. year old male  in NAD. AAO x 3.   Vascular Examination:  Capillary refill time to digits immediate b/l. Palpable DP pulses b/l. Palpable PT pulses b/l. Pedal hair sparse b/l. Skin temperature gradient within normal limits b/l.  Dermatological Examination: Pedal skin with normal turgor, texture and tone bilaterally. No open wounds bilaterally. No interdigital macerations bilaterally. Toenails L 2nd toe, L 3rd toe, L 4th toe, L 5th toe, R hallux, R 2nd toe, R 3rd toe, R 4th toe and R 5th toe elongated, dystrophic, thickened, and crumbly with subungual debris and tenderness to dorsal palpation. Procedure site noted to be completely healed with no erythema, no edema, no drainage, no purulence L hallux.   Evidence of total nail avulsion left hallux nailbed with dried heme. No underlying wound. Nailbed is completely epithelialized. No edema, no erythema, no drainage, no flocculence.  Musculoskeletal: no pain crepitus or joint limitation noted with ROM b/l and drop foot noted LLE.  Neurological: Protective sensation intact 5/5 intact bilaterally with 10g monofilament  b/l  Assessment: 1. Pain due to onychomycosis of toenail   2. Foot drop, left foot    Plan: -Patient examined today. -He is s/p temporary total nail avulsion left hallux and is completely healed. Nailbed cleansed left hallux. -Toenails L 2nd toe, L 3rd toe, L 4th toe, L 5th toe, R hallux, R 2nd toe, R 3rd toe, R 4th toe and R 5th toe debrided in length and girth without iatrogenic bleeding with sterile nail nipper and dremel.  -We discussed an AFO for his LLE. Will schedule with Liliane Channel. -Patient to continue soft, supportive shoe gear daily. -Patient to report any pedal injuries to medical professional immediately. -Patient/POA to call should there be question/concern in the interim.  Return in about 3 months (around 09/21/2019) for nail trim/ Eliquis.

## 2019-06-30 ENCOUNTER — Other Ambulatory Visit: Payer: Self-pay

## 2019-06-30 ENCOUNTER — Ambulatory Visit: Payer: Medicare Other | Admitting: Physical Therapy

## 2019-06-30 DIAGNOSIS — R2689 Other abnormalities of gait and mobility: Secondary | ICD-10-CM

## 2019-06-30 DIAGNOSIS — I69354 Hemiplegia and hemiparesis following cerebral infarction affecting left non-dominant side: Secondary | ICD-10-CM

## 2019-06-30 DIAGNOSIS — M21372 Foot drop, left foot: Secondary | ICD-10-CM

## 2019-06-30 DIAGNOSIS — M6281 Muscle weakness (generalized): Secondary | ICD-10-CM

## 2019-06-30 DIAGNOSIS — R2681 Unsteadiness on feet: Secondary | ICD-10-CM

## 2019-06-30 DIAGNOSIS — R293 Abnormal posture: Secondary | ICD-10-CM

## 2019-06-30 DIAGNOSIS — R296 Repeated falls: Secondary | ICD-10-CM | POA: Diagnosis not present

## 2019-06-30 DIAGNOSIS — M25672 Stiffness of left ankle, not elsewhere classified: Secondary | ICD-10-CM

## 2019-07-01 ENCOUNTER — Encounter: Payer: Self-pay | Admitting: Physical Therapy

## 2019-07-01 NOTE — Therapy (Signed)
[facility] [address] [place], [place], [address] Phone: [phone]   Fax:  [phone]  Physical Therapy Treatment  Patient Details  Name: Gregory Wiley MRN: [id number] Date of Birth: 1950/10/13 Referring Provider (PT): Felecia Shelling, Nanine Means, MD   Encounter Date: 06/30/2019  PT End of Session - 07/01/19 1647    Visit Number  9    Number of Visits  25    Date for PT Re-Evaluation  07/02/19    Authorization Type  [employer] Medicare - 10th visit PN    Progress Note Due on Visit  10    PT Start Time  1452    PT Stop Time  1533    PT Time Calculation (min)  41 min    Equipment Utilized During Treatment  Gait belt    Activity Tolerance  Patient tolerated treatment well    Behavior During Therapy  [facility] for tasks assessed/performed       Past Medical History:  Diagnosis Date  . Adrenal insufficiency ([place])   . Anxiety   . Basal cell carcinoma of skin of left ankle   . Bleeding stomach ulcer 2015  . Coronary artery disease    stent, then CABG 07/20/10  . Daily headache    "for the past month" (11/11/2014)  . Depression   . Diverticulosis   . GERD (gastroesophageal reflux disease)   . Gout   . H/O cardiomyopathy    ischemic, now last echo 07/30/11, EF 38%W  . History of hiatal hernia   . Hyperlipidemia   . Hypertension   . Internal hemorrhoids   . Left hemiplegia ([place])   . NSTEMI (non-ST elevated myocardial infarction) ([place])    NSTEMI- last cath 06/2011-Stent to LCX-DES, last nuc 07/30/11 low risk  . Obesity (BMI 30-39.9)   . OSA on CPAP    since July 2013- uses CPAP sometimes , states he has lost 147 lbs. since stroke & doesn't use the CPAP as much as he use to.   . Plantar fasciitis of left foot   . Pneumonia    hosp. 12/2014  . Pulmonary embolism ([place]) 03/2014  . Stroke ([place]) 01/2014   "no control of LUE, can slightly LLE; memory problems since" (11/11/2014)  . Tubular adenoma of colon     Past Surgical History:   Procedure Laterality Date  . BASAL CELL CARCINOMA EXCISION Left 2015   ankle  . CARDIAC SURGERY    . CATARACT EXTRACTION Bilateral   . COLONOSCOPY N/A 02/10/2013   Procedure: COLONOSCOPY;  Surgeon: Ladene Artist, MD;  Location: [facility] ENDOSCOPY;  Service: Endoscopy;  Laterality: N/A;  . CORONARY ANGIOPLASTY WITH STENT PLACEMENT  07/01/2011   DES-Resolute to native LCX  . CORONARY ANGIOPLASTY WITH STENT PLACEMENT  12/01/2007   COMPLEX 5 LESION PCI INCLUDING CUTTING BALLOON AND 4 CYPHER DESs  . CORONARY ARTERY BYPASS GRAFT  07/20/2010    LIMA-LAD; VG-ACUTE MARG of RCA; Seq VG-distal RCA & then pda  . EP IMPLANTABLE DEVICE N/A 02/14/2016   Procedure: Loop Recorder Removal;  Surgeon: Thompson Grayer, MD;  Location: [facility] CV LAB;  Service: Cardiovascular;  Laterality: N/A;  . ESOPHAGOGASTRODUODENOSCOPY  01/2014   gastric ulcer, erosive gastroduodenitis, H Pylori negative.   . ESOPHAGOGASTRODUODENOSCOPY (EGD) WITH PROPOFOL N/A 11/14/2014   Procedure: ESOPHAGOGASTRODUODENOSCOPY (EGD) WITH PROPOFOL;  Surgeon: Milus Banister, MD;  Location: [facility];  Service: Endoscopy;  Laterality: N/A;  . HAND SURGERY Right    to take glass out  .  HERNIA REPAIR    . INTRAMEDULLARY (IM) NAIL INTERTROCHANTERIC Right 02/07/2015   Procedure: INTRAMEDULLARY (IM) NAIL INTERTROCHANTRIC RIGHT HIP;  Surgeon: Renette Butters, MD;  Location: [facility];  Service: Orthopedics;  Laterality: Right;  . KNEE ARTHROSCOPY Right   . LEFT HEART CATHETERIZATION WITH CORONARY/GRAFT ANGIOGRAM  07/01/2011   Procedure: LEFT HEART CATHETERIZATION WITH Beatrix Fetters;  Surgeon: Sanda Klein, MD;  Location: [facility] CATH LAB;  Service: Cardiovascular;;  . LOOP RECORDER IMPLANT  02/15/2014   MDT LINQ implanted by Dr Rayann Heman for cryptogenic stroke  . PERCUTANEOUS CORONARY STENT INTERVENTION (PCI-S) Right 07/01/2011   Procedure: PERCUTANEOUS CORONARY STENT INTERVENTION (PCI-S);  Surgeon: Sanda Klein, MD;  Location: [facility] CATH LAB;   Service: Cardiovascular;  Laterality: Right;  . PICC LINE PLACE PERIPHERAL ([place] HX)  06/2015  . RETINAL DETACHMENT SURGERY Right   . TEE WITHOUT CARDIOVERSION N/A 02/15/2014   Procedure: TRANSESOPHAGEAL ECHOCARDIOGRAM (TEE);  Surgeon: Josue Hector, MD;  Location: [facility] ENDOSCOPY;  Service: Cardiovascular;  Laterality: N/A;  . UMBILICAL HERNIA REPAIR  2006    There were no vitals filed for this visit.  Subjective Assessment - 07/01/19 1440    Subjective  Still having stomach issues but just went to the bathroom.  Went home after last session and took a long nap.    Patient is accompained by:  Family member   wife   Pertinent History  DNR; anxiety & depression, CAD with CABG, diverticulosis, gout, HLD, HTN, left hemiplegia, NSTEMI (2013), morbid obesity, PE (2016), CVA (2015), tubular adenoma of colon, OSA w/CPAP, right hip fx w/ORIF (2016), iron deficiency anemia    Limitations  Standing;Walking    Patient Stated Goals  to obtain a Bioness for home use; to return to PLOF of ambulating household distances and short community distances with quad cane    Currently in Pain?  No/denies                       [facility] Adult PT Treatment/Exercise - 07/01/19 1440      Bed Mobility   Bed Mobility  Right Sidelying to Sit;Sit to Sidelying Right    Rolling Right  Moderate Assistance - Patient 50-74%    Right Sidelying to Sit  Moderate Assistance - Patient 50-74%    Sit to Sidelying Right  Moderate Assistance - Patient 50-74%      Transfers   Transfers  Sit to Stand;Stand to Sit;Stand Pivot Transfers    Sit to Stand  4: Min guard;3: Mod assist    Sit to Stand Details (indicate cue type and reason)  Min guard to stand from mat and w/c in order to transfer to mat.  When seated in w/c at // bars pt required mod A to stand due to feet being on slanted surface.  Increased assistance to shift weight forwards over BOS    Stand to Sit  4: Min assist    Stand Pivot Transfers  4: Min assist     Stand Pivot Transfer Details (indicate cue type and reason)  with quad cane or with UE support on //  bars; pt self selected pivot to R, even when transferring back over to mat, pt performed >180 deg turn in order to turn to R and avoid turning to L      Ambulation/Gait   Ambulation/Gait  Yes    Ambulation/Gait Assistance  3: Mod assist    Ambulation/Gait Assistance Details  continued ambulation in // bars with pt holding with  RUE; therapist assisted with full advancement and placement of LLE but pt demonstrated decreased ER and crossing midline today; assist for more upright posture and assistance to weight shift fully over LLE during stance.  Performed down and back with two seated rest breaks at each end.  Second time pt performed down and back along // bars without stopping for seated rest break in between.      Ambulation Distance (Feet)  12 Feet   6 x 2 + 12   Assistive device  Parallel bars    Gait Pattern  Step-through pattern;Decreased stance time - left;Decreased stride length;Decreased hip/knee flexion - left;Decreased dorsiflexion - left;Decreased weight shift to left;Narrow base of support;Lateral trunk lean to right    Ambulation Surface  Level;Indoor      Knee/Hip Exercises: Stretches   Piriformis Stretch  Left;3 reps;60 seconds    Piriformis Stretch Limitations  in R sidelying with hip flexion and adduction with use of gravity.  Intermittent breaks due to hip pain.      Knee/Hip Exercises: Sidelying   Other Sidelying Knee/Hip Exercises  Active assisted hip and knee flexion with gravity minimized x 10 reps with therapist supporting LLE             PT Education - 07/01/19 1647    Education Details  will give written AFO recommendations Monday    Person(s) Educated  Patient    Methods  Explanation    Comprehension  Verbalized understanding       PT Short Term Goals - 06/03/19 2053      PT SHORT TERM GOAL #1   Title  Patient and wife demonstrate independence with  initial LE stretching HEP    Baseline  2nd visit - no HEP set currently delay in patient care due to COVID and wife's eye surgery    Time  4    Period  Weeks    Status  Not Met      PT SHORT TERM GOAL #2   Title  Patient participates in TUG assessment.    Baseline  Pt not ambulatory at this time    Time  4    Period  Weeks    Status  Not Met      PT SHORT TERM GOAL #3   Title  Pt tolerates use of Bioness unit for transfer training with quad cane and min A    Baseline  Mod-max A for transfers - not appropriate for Bioness at this time    Time  4    Period  Weeks    Status  Not Met      PT SHORT TERM GOAL #4   Title  Patient tolerates wear of bioness unit for TUG and gait velocity assessment with Min A    Baseline  not appropriate for Bioness at this time    Time  4    Period  Weeks    Status  Deferred      PT SHORT TERM GOAL #5   Title  Pt will improve gait velocity with quad cane and most appropriate orthosis to >/=0.5 ft/sec    Baseline  not ambulatory at this time    Time  4    Period  Weeks    Status  Not Met        PT Long Term Goals - 06/03/19 2056      PT LONG TERM GOAL #1   Title  Patient and wife demonstrate and verbalizes independence with initial  HEP. (all LTGs target date: 07/02/2019)    Time  12    Period  Weeks    Status  Revised      PT LONG TERM GOAL #2   Title  Patient will improve standing balance and endurance to be able to participate in falls risk assessment with TUG with LRAD    Time  12    Period  Weeks    Status  Revised      PT LONG TERM GOAL #3   Title  Patient will improve gait velocity with quad cane and most appropriate orthosis (bioness or AFO) to >0.4 ft/sec    Baseline  .12 ft/sec    Time  12    Period  Weeks    Status  Revised      PT LONG TERM GOAL #4   Title  Patient demonstrates improved L passive ankle DF by at least 10 degrees.    Baseline  lacking 24 deg with knee extended, lacking 10 deg with knee flexed    Time  12     Period  Weeks    Status  New      PT LONG TERM GOAL #5   Title  Pt will demonstrate ability to transfer w/c <> mat/chair with quad cane, most appropriate orthosis and supervision    Time  12    Period  Weeks    Status  New            Plan - 07/01/19 1648    Clinical Impression Statement  Pt demonstrating improved ability to perform sit > stand transfer with decreased assistance from therapist.  Continues to tolerate sidelying exercises and stretches to prepare for gait training.  Pt demonstrated improved endurance today and was able to make 4 trips along // bars with decreased seated rest breaks.  Edema in LLE is improving - may be appropriate to retry Bioness next week.    Personal Factors and Comorbidities  Age;Fitness;Past/Current Experience;Comorbidity 3+;Time since onset of injury/illness/exacerbation    Comorbidities  anixety & depression, CAD, diverticulosis, gout, HLD, HTN, left hemiplegia, NSTEMI (2013), morbid obesity, PE (2016), CVA (2015), tubular adenoma of colon, OSA w/CPAP, right hip fx w/ORIF (2016), iron deficiency anemia    Examination-Activity Limitations  Squat;Stairs;Stand;Dressing;Transfers;Bed Mobility;Locomotion Level    Examination-Participation Restrictions  Church;Community Activity    Stability/Clinical Decision Making  Evolving/Moderate complexity    Rehab Potential  Good    PT Frequency  2x / week    PT Duration  12 weeks    PT Treatment/Interventions  ADLs/Self Care Home Management;Electrical Stimulation;DME Instruction;Balance training;Therapeutic exercise;Orthotic Fit/Training;Gait training;Neuromuscular re-education;Stair training;Functional mobility training;Patient/family education;Therapeutic activities;Manual techniques;Compression bandaging;Passive range of motion    PT Next Visit Plan  **DNR** assess vitals throughout session. DO NOT feel pt is appropriate for Bioness at this time. GIVE written recommendations for custom molded AFO.  10th visit  PN/check goals and RECERT.  Retry Bioness.  Sit > stand training.  Stand pivots to L.  Gait in // bars after stretching.    Consulted and Agree with Plan of Care  Patient;Family member/caregiver    Family Member Consulted  spouse       Patient will benefit from skilled therapeutic intervention in order to improve the following deficits and impairments:  Abnormal gait, Decreased range of motion, Difficulty walking, Impaired tone, Obesity, Impaired UE functional use, Decreased endurance, Decreased activity tolerance, Decreased skin integrity, Decreased balance, Decreased knowledge of use of DME, Impaired flexibility, Postural dysfunction, Impaired sensation, Increased  edema, Decreased strength, Decreased mobility  Visit Diagnosis: Repeated falls  Other abnormalities of gait and mobility  Muscle weakness (generalized)  Unsteadiness on feet  Abnormal posture  Foot drop, left  Spastic hemiplegia of left nondominant side as late effect of cerebral infarction (HCC)  Stiffness of left ankle, not elsewhere classified     Problem List Patient Active Problem List   Diagnosis Date Noted  . GERD (gastroesophageal reflux disease) 06/18/2019  . Tinea cruris 07/20/2017  . Hemorrhoid 07/20/2017  . Irritability 06/08/2017  . Fall at home 05/06/2017  . Advance care planning 11/28/2016  . Skin mass 11/28/2016  . Lower urinary tract symptoms (LUTS) 09/05/2016  . Hearing loss 09/05/2016  . Excessive daytime sleepiness 04/16/2016  . Exposure to TB 02/21/2016  . Vitamin D deficiency 02/21/2016  . Anemia 11/10/2015  . Left thigh pain 11/10/2015  . Neck pain 10/12/2015  . Hemiplegia affecting left side in left-dominant patient as late effect of cerebrovascular disease ([place]) 09/03/2015  . Medicare annual wellness visit, subsequent 08/14/2015  . Acute pulmonary embolism ([place]) 07/25/2015  . Chest wall pain 07/15/2015  . History of pulmonary embolism 07/15/2015  . Chronic anticoagulation  05/22/2015  . Gait disturbance 05/15/2015  . DNR (do not resuscitate) 01/19/2015  . Hypotension 01/14/2015  . Depression 12/14/2014  . OSA (obstructive sleep apnea) 11/30/2014  . Chronic combined systolic and diastolic congestive heart failure ([place]) 11/11/2014  . SOB (shortness of breath) 04/17/2014  . Hemiplegia affecting left dominant side ([place]) 02/16/2014  . 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 06/30/2011    [name], PT, DPT 07/01/19    4:52 PM    [facility] [address] [place], [place], [address] Phone: [phone]   Fax:  [phone]  Name: Gregory Wiley MRN: [id number] Date of Birth: [birth year]

## 2019-07-02 ENCOUNTER — Encounter: Payer: Self-pay | Admitting: Family Medicine

## 2019-07-02 ENCOUNTER — Telehealth: Payer: Self-pay | Admitting: *Deleted

## 2019-07-02 NOTE — Telephone Encounter (Signed)
Letter received from patient's insurance indicating that they will not cover Pantoprazole unless at least 3 of their alternative medications have been tried and failed.  Patient was advised that this will either need to be cash pay or try one of the alternative medications.  Patient says he had been on Zantac previously but that is the only other similar medication that he is aware of.  Patient is willing to try one of the alternatives which are:    Esomeprazole delayed release  Lansoprazole delayed release  Omeprazole delayed release  Pantoprazole delayed release  Dexilant Please send in to patient's pharmacy.

## 2019-07-04 MED ORDER — LANSOPRAZOLE 30 MG PO CPDR: 30.0000 mg | DELAYED_RELEASE_CAPSULE | Freq: Two times a day (BID) | ORAL | 1 refills | Status: DC

## 2019-07-04 NOTE — Telephone Encounter (Signed)
Would change to lansoprazole, rx sent.  Thanks.

## 2019-07-05 ENCOUNTER — Other Ambulatory Visit: Payer: Self-pay | Admitting: *Deleted

## 2019-07-05 ENCOUNTER — Other Ambulatory Visit: Payer: Self-pay

## 2019-07-05 ENCOUNTER — Ambulatory Visit: Payer: Medicare Other | Admitting: Physical Therapy

## 2019-07-05 ENCOUNTER — Encounter: Payer: Self-pay | Admitting: Family Medicine

## 2019-07-05 DIAGNOSIS — M6281 Muscle weakness (generalized): Secondary | ICD-10-CM

## 2019-07-05 DIAGNOSIS — R296 Repeated falls: Secondary | ICD-10-CM

## 2019-07-05 DIAGNOSIS — R293 Abnormal posture: Secondary | ICD-10-CM

## 2019-07-05 DIAGNOSIS — R2689 Other abnormalities of gait and mobility: Secondary | ICD-10-CM

## 2019-07-05 DIAGNOSIS — R2681 Unsteadiness on feet: Secondary | ICD-10-CM

## 2019-07-05 DIAGNOSIS — M21372 Foot drop, left foot: Secondary | ICD-10-CM

## 2019-07-05 DIAGNOSIS — I69354 Hemiplegia and hemiparesis following cerebral infarction affecting left non-dominant side: Secondary | ICD-10-CM

## 2019-07-05 DIAGNOSIS — M25672 Stiffness of left ankle, not elsewhere classified: Secondary | ICD-10-CM

## 2019-07-05 NOTE — Telephone Encounter (Signed)
Patient refill request:  [facility] Suppository Last office visit:   06/17/2019 Last Filled:    24 suppository 3 07/18/2017  Please advise.

## 2019-07-05 NOTE — Patient Instructions (Addendum)
For AFO assessment: -Gregory Wiley has an off the shelf, ground reaction AFO that does not provide sufficient lateral ankle stability  -May benefit from custom molded AFO to provide greater lateral stability with a strap to control excessive supination (spasticity) but pt does have LLE edema.    -We are working on gait mechanics and he is unable to achieve heel strike at initial stance phase and is unable to advance tibia over L foot during mid stance.  With swing he utilizes strong hip ER and adduction to advance his LLE.   [name], PT, DPT 07/05/19    2:10 PM [facility] [address], [place], [place] [address] [phone]

## 2019-07-06 ENCOUNTER — Other Ambulatory Visit: Payer: Self-pay | Admitting: Family Medicine

## 2019-07-06 ENCOUNTER — Ambulatory Visit: Payer: Medicare Other | Admitting: Orthotics

## 2019-07-06 DIAGNOSIS — M21372 Foot drop, left foot: Secondary | ICD-10-CM

## 2019-07-06 DIAGNOSIS — B351 Tinea unguium: Secondary | ICD-10-CM

## 2019-07-06 NOTE — Progress Notes (Signed)
He has severe foot drop/lateral ankle instability due to CVA; from heel strike to dorsiflextion ambulation musculature is compromised.  Gregory Wiley is best served by using PT referral to go to [facility] where he can best achieve a desrirable outcome given the severity/complexity of his condition.

## 2019-07-06 NOTE — Therapy (Signed)
[facility] [address] [place], [place], [address] Phone: [phone]   Fax:  [phone]  Physical Therapy Treatment/Recertification/10th visit Progress Note  Patient Details  Name: Gregory Wiley MRN: [id number] Date of Birth: 1950-10-12 Referring Provider (PT): Felecia Shelling, Nanine Means, MD   Encounter Date: 07/05/2019   Progress Note Reporting Period 04/05/19 to 07/05/19  See note below for Objective Data and Assessment of Progress/Goals.    PT End of Session - 07/06/19 1510    Visit Number  10    Number of Visits  34    Date for PT Re-Evaluation  10/04/19    Authorization Type  [employer] Medicare - 10th visit PN    Progress Note Due on Visit  20    PT Start Time  1315    PT Stop Time  1400    PT Time Calculation (min)  45 min    Equipment Utilized During Treatment  Gait belt    Activity Tolerance  Patient tolerated treatment well    Behavior During Therapy  WFL for tasks assessed/performed       Past Medical History:  Diagnosis Date  . Adrenal insufficiency ([place])   . Anxiety   . Basal cell carcinoma of skin of left ankle   . Bleeding stomach ulcer 2015  . Coronary artery disease    stent, then CABG 07/20/10  . Daily headache    "for the past month" (11/11/2014)  . Depression   . Diverticulosis   . GERD (gastroesophageal reflux disease)   . Gout   . H/O cardiomyopathy    ischemic, now last echo 07/30/11, EF 38%W  . History of hiatal hernia   . Hyperlipidemia   . Hypertension   . Internal hemorrhoids   . Left hemiplegia ([place])   . NSTEMI (non-ST elevated myocardial infarction) ([place])    NSTEMI- last cath 06/2011-Stent to LCX-DES, last nuc 07/30/11 low risk  . Obesity (BMI 30-39.9)   . OSA on CPAP    since July 2013- uses CPAP sometimes , states he has lost 147 lbs. since stroke & doesn't use the CPAP as much as he use to.   . Plantar fasciitis of left foot   . Pneumonia    hosp. 12/2014  . Pulmonary embolism ([place])  03/2014  . Stroke ([place]) 01/2014   "no control of LUE, can slightly LLE; memory problems since" (11/11/2014)  . Tubular adenoma of colon     Past Surgical History:  Procedure Laterality Date  . BASAL CELL CARCINOMA EXCISION Left 2015   ankle  . CARDIAC SURGERY    . CATARACT EXTRACTION Bilateral   . COLONOSCOPY N/A 02/10/2013   Procedure: COLONOSCOPY;  Surgeon: Ladene Artist, MD;  Location: [facility] END[facility];  Service: Endoscopy;  Laterality: N/A;  . CORONARY ANGIOPLASTY WITH STENT PLACEMENT  07/01/2011   DES-Resolute to native LCX  . CORONARY ANGIOPLASTY WITH STENT PLACEMENT  12/01/2007   COMPLEX 5 LESION PCI INCLUDING CUTTING BALLOON AND 4 CYPHER DESs  . CORONARY ARTERY BYPASS GRAFT  07/20/2010    LIMA-LAD; VG-ACUTE MARG of RCA; Seq VG-distal RCA & then pda  . EP IMPLANTABLE DEVICE N/A 02/14/2016   Procedure: Loop Recorder Removal;  Surgeon: Thompson Grayer, MD;  Location: [facility] CV LAB;  Service: Cardiovascular;  Laterality: N/A;  . ESOPHAGOGASTRODUODENOSCOPY  01/2014   gastric ulcer, erosive gastroduodenitis, H Pylori negative.   . ESOPHAGOGASTRODUODENOSCOPY (EGD) WITH PROPOFOL N/A 11/14/2014   Procedure: ESOPHAGOGASTRODUODENOSCOPY (EGD) WITH PROPOFOL;  Surgeon: Quillian Quince  Merrily Brittle, MD;  Location: [facility] ENDOSCOPY;  Service: Endoscopy;  Laterality: N/A;  . HAND SURGERY Right    to take glass out  . HERNIA REPAIR    . INTRAMEDULLARY (IM) NAIL INTERTROCHANTERIC Right 02/07/2015   Procedure: INTRAMEDULLARY (IM) NAIL INTERTROCHANTRIC RIGHT HIP;  Surgeon: Renette Butters, MD;  Location: [facility];  Service: Orthopedics;  Laterality: Right;  . KNEE ARTHROSCOPY Right   . LEFT HEART CATHETERIZATION WITH CORONARY/GRAFT ANGIOGRAM  07/01/2011   Procedure: LEFT HEART CATHETERIZATION WITH Beatrix Fetters;  Surgeon: Sanda Klein, MD;  Location: [facility] CATH LAB;  Service: Cardiovascular;;  . LOOP RECORDER IMPLANT  02/15/2014   MDT LINQ implanted by Dr Rayann Heman for cryptogenic stroke  . PERCUTANEOUS CORONARY  STENT INTERVENTION (PCI-S) Right 07/01/2011   Procedure: PERCUTANEOUS CORONARY STENT INTERVENTION (PCI-S);  Surgeon: Sanda Klein, MD;  Location: [facility] CATH LAB;  Service: Cardiovascular;  Laterality: Right;  . PICC LINE PLACE PERIPHERAL ([place] HX)  06/2015  . RETINAL DETACHMENT SURGERY Right   . TEE WITHOUT CARDIOVERSION N/A 02/15/2014   Procedure: TRANSESOPHAGEAL ECHOCARDIOGRAM (TEE);  Surgeon: Josue Hector, MD;  Location: [facility] ENDOSCOPY;  Service: Cardiovascular;  Laterality: N/A;  . UMBILICAL HERNIA REPAIR  2006    There were no vitals filed for this visit.  Subjective Assessment - 07/05/19 1338    Subjective  Had CT due to hallucinations after fall.  Seeing people/shadows and objects that are not there.  Asking for note to take to orthotist about recommendations for AFO.    Patient is accompained by:  Family member   wife   Pertinent History  DNR; anxiety & depression, CAD with CABG, diverticulosis, gout, HLD, HTN, left hemiplegia, NSTEMI (2013), morbid obesity, PE (2016), CVA (2015), tubular adenoma of colon, OSA w/CPAP, right hip fx w/ORIF (2016), iron deficiency anemia    Limitations  Standing;Walking    Patient Stated Goals  to obtain a Bioness for home use; to return to PLOF of ambulating household distances and short community distances with quad cane    Currently in Pain?  Yes         [facility] PT Assessment - 07/05/19 1339      Assessment   Medical Diagnosis  Left Foot Drop    Referring Provider (PT)  Sater, Nanine Means, MD    Onset Date/Surgical Date  03/29/19    Next MD Visit  in April     Prior Therapy  yes in 2016 s/p CVA 2015      Precautions   Precautions  Fall    Precaution Comments  DNR; anxiety & depression, CAD with CABG, diverticulosis, gout, HLD, HTN, left hemiplegia, NSTEMI (2013), morbid obesity, PE (2016), CVA (2015), tubular adenoma of colon, OSA w/CPAP, right hip fx w/ORIF (2016), iron deficiency anemia      Restrictions   Weight Bearing Restrictions  No       Balance Screen   Has the patient fallen in the past 6 months  Yes      Prior Function   Level of Independence  Independent with household mobility with device    Vocation  Retired      Observation/Other Assessments   Focus on Therapeutic Outcomes (FOTO)   Not assessed      ROM / Strength   AROM / PROM / Strength  PROM      PROM   Overall PROM   Deficits    PROM Assessment Site  Ankle    Right/Left Ankle  Left    Left  Ankle Dorsiflexion  -16   knee flexed; knee straight: -20     Transfers   Transfers  Sit to Stand;Stand to Sit;Stand Pivot Transfers    Sit to Stand  4: Min assist    Sit to Stand Details (indicate cue type and reason)  Min A to stand from w/c or mat with RUE support and therapist assisting with anterior weight shift and L foot placement    Stand to Sit  4: Min assist    Stand Pivot Transfers  4: Min assist;3: Mod assist    Stand Pivot Transfer Details (indicate cue type and reason)  with quad cane - Min A to R, mod A to L to assist with L foot placement and weight shift      Ambulation/Gait   Ambulation/Gait  Yes    Ambulation/Gait Assistance  3: Mod assist    Ambulation/Gait Assistance Details  with quad cane; assist to prevent LLE from adducting.  Assisted with LLE placement and anterior, L lateral weight shift for increased L stance time    Ambulation Distance (Feet)  12 Feet    Assistive device  Large base quad cane    Gait Pattern  Step-through pattern;Decreased stance time - left;Decreased stride length;Decreased hip/knee flexion - left;Decreased dorsiflexion - left;Decreased weight shift to left;Narrow base of support;Lateral trunk lean to right;Decreased arm swing - left;Step-to pattern;Decreased step length - left    Ambulation Surface  Level;Indoor      Standardized Balance Assessment   Standardized Balance Assessment  Timed Up and Go Test;10 meter walk test    10 Meter Walk  57 seconds over 10 feet with quad cane and mod A: .17 ft/sec      Timed Up  and Go Test   TUG  Normal TUG    Normal TUG (seconds)  142   able to walk down and turn, not able to walk back   TUG Comments  with quad cane and mod A; only completed half of test                           PT Education - 07/06/19 1509    Education Details  AFO recommendations; education regarding wording and impression statement in CT per radiologist report    Person(s) Educated  Patient;Spouse    Methods  Explanation;Handout    Comprehension  Verbalized understanding       PT Short Term Goals - 06/03/19 2053      PT SHORT TERM GOAL #1   Title  Patient and wife demonstrate independence with initial LE stretching HEP    Baseline  2nd visit - no HEP set currently delay in patient care due to COVID and wife's eye surgery    Time  4    Period  Weeks    Status  Not Met      PT SHORT TERM GOAL #2   Title  Patient participates in TUG assessment.    Baseline  Pt not ambulatory at this time    Time  4    Period  Weeks    Status  Not Met      PT SHORT TERM GOAL #3   Title  Pt tolerates use of Bioness unit for transfer training with quad cane and min A    Baseline  Mod-max A for transfers - not appropriate for Bioness at this time    Time  4    Period  Weeks  Status  Not Met      PT SHORT TERM GOAL #4   Title  Patient tolerates wear of bioness unit for TUG and gait velocity assessment with Min A    Baseline  not appropriate for Bioness at this time    Time  4    Period  Weeks    Status  Deferred      PT SHORT TERM GOAL #5   Title  Pt will improve gait velocity with quad cane and most appropriate orthosis to >/=0.5 ft/sec    Baseline  not ambulatory at this time    Time  4    Period  Weeks    Status  Not Met        PT Long Term Goals - 07/05/19 1343      PT LONG TERM GOAL #1   Title  Patient and wife demonstrate and verbalizes independence with initial HEP. (all LTGs target date: 07/02/2019)    Baseline  difficulty finding exercises for pt to do  at home, sits and sleeps in recliner; no access to bed    Time  12    Period  Weeks    Status  On-going    Target Date  07/02/19      PT LONG TERM GOAL #2   Title  Patient will improve standing balance and endurance to be able to participate in falls risk assessment with TUG with LRAD    Time  12    Period  Weeks    Status  Achieved      PT LONG TERM GOAL #3   Title  Patient will improve gait velocity with quad cane and most appropriate orthosis (bioness or AFO) to >0.4 ft/sec    Baseline  .12 ft/sec > .17 ft/sec with cane, no AFO    Time  12    Period  Weeks    Status  Not Met      PT LONG TERM GOAL #4   Title  Patient demonstrates improved L passive ankle DF by at least 10 degrees.    Baseline  lacking 24 deg with knee extended, lacking 10 deg with knee flexed > Lacking 20 deg with knee extended, lacking 16 deg with knee flexed    Time  12    Period  Weeks    Status  Not Met      PT LONG TERM GOAL #5   Title  Pt will demonstrate ability to transfer w/c <> mat/chair with quad cane, most appropriate orthosis and supervision    Baseline  min-mod A with quad cane    Time  12    Period  Weeks    Status  Not Met      New goals for recertification: PT Short Term Goals - 07/06/19 1523      PT SHORT TERM GOAL #1   Title  Patient will be able to perform 2-3 exercises in standing for HEP  (RESET STG AFTER 30 DAYS)    Time  4    Period  Weeks    Status  Revised    Target Date  08/05/19      PT SHORT TERM GOAL #2   Title  Pt will be able to perform full TUG with cane    Baseline  1/2 TUG    Time  4    Period  Weeks    Status  Revised    Target Date  08/05/19      PT  SHORT TERM GOAL #3   Title  Pt tolerates use of Bioness unit for transfer and gait training with quad cane and min A    Time  4    Period  Weeks    Status  Revised    Target Date  08/05/19      PT SHORT TERM GOAL #4   Title  Pt will improve gait velocity (over 10 foot distance) to >/= .5 ft/sec with cane  and new AFO    Baseline  .17 ft/sec, no AFO with cane    Time  4    Period  Weeks    Status  Revised    Target Date  08/05/19      PT SHORT TERM GOAL #5   Title  Pt will consistently perform stand pivot transfers to L and R with cane and AFO with min A    Time  4    Period  Weeks    Status  Revised    Target Date  08/05/19      PT Long Term Goals - 07/06/19 1527      PT LONG TERM GOAL #1   Title  Patient and wife demonstrate and verbalizes independence with final HEP    Baseline  difficulty finding exercises for pt to do at home, sits and sleeps in recliner; no access to bed    Time  12    Period  Weeks    Status  Revised    Target Date  10/04/19      PT LONG TERM GOAL #2   Title  Pt will improve TUG with cane and Bioness/AFO to </= 60 seconds    Baseline  TBD for full TUG    Time  12    Period  Weeks    Status  Revised    Target Date  10/04/19      PT LONG TERM GOAL #3   Title  Patient will improve gait velocity over full distance with quad cane and most appropriate orthosis (bioness or AFO) to >0.8 ft/sec    Time  12    Period  Weeks    Status  Revised    Target Date  10/04/19      PT LONG TERM GOAL #4   Title  Patient demonstrates improved L passive ankle DF by at least 10 degrees.    Baseline  lacking 24 deg with knee extended, lacking 10 deg with knee flexed > Lacking 20 deg with knee extended, lacking 16 deg with knee flexed    Time  12    Period  Weeks    Status  Revised    Target Date  10/04/19      PT LONG TERM GOAL #5   Title  Pt will demonstrate ability to transfer w/c <> mat/chair with quad cane, most appropriate orthosis and supervision    Baseline  min-mod A with quad cane    Time  12    Period  Weeks    Status  Revised    Target Date  10/04/19            Plan - 07/06/19 1516    Clinical Impression Statement  Pt is making slow progress towards goals.  Pt missed multiple weeks of therapy due to his wife having eye surgery and had lifting  restrictions causing further decline in patient function.  Pt also has limited ability to perform a HEP at home due to no access to a bed  and he requires assistance to perform standing HEP.  Pt has experienced one fall while transferring at home since beginning therapy and required x-ray and clearance before returning to therapy.  Pt has made some progress with sit <> stand and stand pivot transfers and was able to partially participate in falls risk assessment with performing 1/2 of a TUG.  Pt continues to demonstrate decreased functional strength and endurance, decreased L LE ROM, increased tone/spasticity, impaired postural control, balance and gait.  Pt will be assessed for new AFO tomorrow.  Pt is demonstrated decreased edema in LLE and may be appropriate to trial Bioness again.  Will continue to benefit from PT to address these impairments, to maximize functional mobility independence and decrease falls risk and burden of care.    Personal Factors and Comorbidities  Age;Fitness;Past/Current Experience;Comorbidity 3+;Time since onset of injury/illness/exacerbation    Comorbidities  anixety & depression, CAD, diverticulosis, gout, HLD, HTN, left hemiplegia, NSTEMI (2013), morbid obesity, PE (2016), CVA (2015), tubular adenoma of colon, OSA w/CPAP, right hip fx w/ORIF (2016), iron deficiency anemia    Examination-Activity Limitations  Squat;Stairs;Stand;Dressing;Transfers;Bed Mobility;Locomotion Level    Examination-Participation Restrictions  Church;Community Activity    Stability/Clinical Decision Making  Evolving/Moderate complexity    Rehab Potential  Good    PT Frequency  2x / week    PT Duration  12 weeks    PT Treatment/Interventions  ADLs/Self Care Home Management;Electrical Stimulation;DME Instruction;Balance training;Therapeutic exercise;Orthotic Fit/Training;Gait training;Neuromuscular re-education;Stair training;Functional mobility training;Patient/family education;Therapeutic  activities;Manual techniques;Compression bandaging;Passive range of motion    PT Next Visit Plan  **DNR** assess vitals throughout session. How did AFO appointment go?  Retry Bioness.  Sit > stand training.  Stand pivots to L.  Gait with cane; endurance and standing tolerance.  Sidelying stretches and exercises with gravity minimized.    Consulted and Agree with Plan of Care  Patient;Family member/caregiver    Family Member Consulted  spouse       Patient will benefit from skilled therapeutic intervention in order to improve the following deficits and impairments:  Abnormal gait, Decreased range of motion, Difficulty walking, Impaired tone, Obesity, Impaired UE functional use, Decreased endurance, Decreased activity tolerance, Decreased skin integrity, Decreased balance, Decreased knowledge of use of DME, Impaired flexibility, Postural dysfunction, Impaired sensation, Increased edema, Decreased strength, Decreased mobility  Visit Diagnosis: Repeated falls  Other abnormalities of gait and mobility  Muscle weakness (generalized)  Unsteadiness on feet  Abnormal posture  Foot drop, left  Spastic hemiplegia of left nondominant side as late effect of cerebral infarction (HCC)  Stiffness of left ankle, not elsewhere classified     Problem List Patient Active Problem List   Diagnosis Date Noted  . GERD (gastroesophageal reflux disease) 06/18/2019  . Tinea cruris 07/20/2017  . Hemorrhoid 07/20/2017  . Irritability 06/08/2017  . Fall at home 05/06/2017  . Advance care planning 11/28/2016  . Skin mass 11/28/2016  . Lower urinary tract symptoms (LUTS) 09/05/2016  . Hearing loss 09/05/2016  . Excessive daytime sleepiness 04/16/2016  . Exposure to TB 02/21/2016  . Vitamin D deficiency 02/21/2016  . Anemia 11/10/2015  . Left thigh pain 11/10/2015  . Neck pain 10/12/2015  . Hemiplegia affecting left side in left-dominant patient as late effect of cerebrovascular disease ([place])  09/03/2015  . Medicare annual wellness visit, subsequent 08/14/2015  . Acute pulmonary embolism ([place]) 07/25/2015  . Chest wall pain 07/15/2015  . History of pulmonary embolism 07/15/2015  . Chronic anticoagulation 05/22/2015  . Gait disturbance 05/15/2015  .  DNR (do not resuscitate) 01/19/2015  . Hypotension 01/14/2015  . Depression 12/14/2014  . OSA (obstructive sleep apnea) 11/30/2014  . Chronic combined systolic and diastolic congestive heart failure ([place]) 11/11/2014  . SOB (shortness of breath) 04/17/2014  . Hemiplegia affecting left dominant side ([place]) 02/16/2014  . 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 06/30/2011   [name], PT, DPT 07/06/19    3:23 PM    [facility] [address] [place], [place], [address] Phone: [phone]   Fax:  [phone]  Name: Gregory Wiley MRN: [id number] Date of Birth: Jul 28, 1950

## 2019-07-07 ENCOUNTER — Ambulatory Visit: Payer: Medicare Other | Admitting: Physical Therapy

## 2019-07-07 ENCOUNTER — Other Ambulatory Visit: Payer: Self-pay

## 2019-07-07 ENCOUNTER — Telehealth: Payer: Self-pay | Admitting: Physical Therapy

## 2019-07-07 ENCOUNTER — Encounter: Payer: Self-pay | Admitting: Physical Therapy

## 2019-07-07 DIAGNOSIS — R296 Repeated falls: Secondary | ICD-10-CM | POA: Diagnosis not present

## 2019-07-07 DIAGNOSIS — M6281 Muscle weakness (generalized): Secondary | ICD-10-CM

## 2019-07-07 DIAGNOSIS — R2681 Unsteadiness on feet: Secondary | ICD-10-CM

## 2019-07-07 DIAGNOSIS — M21372 Foot drop, left foot: Secondary | ICD-10-CM

## 2019-07-07 DIAGNOSIS — R293 Abnormal posture: Secondary | ICD-10-CM

## 2019-07-07 DIAGNOSIS — R2689 Other abnormalities of gait and mobility: Secondary | ICD-10-CM

## 2019-07-07 DIAGNOSIS — I69354 Hemiplegia and hemiparesis following cerebral infarction affecting left non-dominant side: Secondary | ICD-10-CM

## 2019-07-07 DIAGNOSIS — M25672 Stiffness of left ankle, not elsewhere classified: Secondary | ICD-10-CM

## 2019-07-07 MED ORDER — HYDROCORTISONE ACETATE 25 MG RE SUPP: 25.0000 mg | Freq: Two times a day (BID) | RECTAL | 3 refills | Status: DC | PRN

## 2019-07-07 NOTE — Telephone Encounter (Signed)
Entered in error

## 2019-07-07 NOTE — Telephone Encounter (Signed)
Thank you for seeing him.  I will address this at his visit next week

## 2019-07-07 NOTE — Therapy (Signed)
[facility] [address] [place], [place], [address] Phone: [phone]   Fax:  [phone]  Physical Therapy Treatment  Patient Details  Name: Gregory Wiley MRN: [id number] Date of Birth: [birth year] Referring Provider (PT): Felecia Shelling, Nanine Means, MD   Encounter Date: 07/07/2019  PT End of Session - 07/07/19 1637    Visit Number  11    Number of Visits  34    Date for PT Re-Evaluation  10/04/19    Authorization Type  [employer] Medicare - 10th visit PN    Progress Note Due on Visit  20    PT Start Time  1445    PT Stop Time  1530    PT Time Calculation (min)  45 min    Equipment Utilized During Treatment  Gait belt;Other (comment)   Bioness   Activity Tolerance  Patient tolerated treatment well    Behavior During Therapy  WFL for tasks assessed/performed       Past Medical History:  Diagnosis Date  . Adrenal insufficiency ([place])   . Anxiety   . Basal cell carcinoma of skin of left ankle   . Bleeding stomach ulcer 2015  . Coronary artery disease    stent, then CABG 07/20/10  . Daily headache    "for the past month" (11/11/2014)  . Depression   . Diverticulosis   . GERD (gastroesophageal reflux disease)   . Gout   . H/O cardiomyopathy    ischemic, now last echo 07/30/11, EF 38%W  . History of hiatal hernia   . Hyperlipidemia   . Hypertension   . Internal hemorrhoids   . Left hemiplegia ([place])   . NSTEMI (non-ST elevated myocardial infarction) ([place])    NSTEMI- last cath 06/2011-Stent to LCX-DES, last nuc 07/30/11 low risk  . Obesity (BMI 30-39.9)   . OSA on CPAP    since July 2013- uses CPAP sometimes , states he has lost 147 lbs. since stroke & doesn't use the CPAP as much as he use to.   . Plantar fasciitis of left foot   . Pneumonia    hosp. 12/2014  . Pulmonary embolism ([place]) 03/2014  . Stroke ([place]) 01/2014   "no control of LUE, can slightly LLE; memory problems since" (11/11/2014)  . Tubular adenoma of colon      Past Surgical History:  Procedure Laterality Date  . BASAL CELL CARCINOMA EXCISION Left 2015   ankle  . CARDIAC SURGERY    . CATARACT EXTRACTION Bilateral   . COLONOSCOPY N/A 02/10/2013   Procedure: COLONOSCOPY;  Surgeon: Ladene Artist, MD;  Location: [facility];  Service: Endoscopy;  Laterality: N/A;  . CORONARY ANGIOPLASTY WITH STENT PLACEMENT  07/01/2011   DES-Resolute to native LCX  . CORONARY ANGIOPLASTY WITH STENT PLACEMENT  12/01/2007   COMPLEX 5 LESION PCI INCLUDING CUTTING BALLOON AND 4 CYPHER DESs  . CORONARY ARTERY BYPASS GRAFT  07/20/2010    LIMA-LAD; VG-ACUTE MARG of RCA; Seq VG-distal RCA & then pda  . EP IMPLANTABLE DEVICE N/A 02/14/2016   Procedure: Loop Recorder Removal;  Surgeon: Thompson Grayer, MD;  Location: [facility] CV LAB;  Service: Cardiovascular;  Laterality: N/A;  . ESOPHAGOGASTRODUODENOSCOPY  01/2014   gastric ulcer, erosive gastroduodenitis, H Pylori negative.   . ESOPHAGOGASTRODUODENOSCOPY (EGD) WITH PROPOFOL N/A 11/14/2014   Procedure: ESOPHAGOGASTRODUODENOSCOPY (EGD) WITH PROPOFOL;  Surgeon: Milus Banister, MD;  Location: [facility];  Service: Endoscopy;  Laterality: N/A;  . HAND SURGERY Right    to  take glass out  . HERNIA REPAIR    . INTRAMEDULLARY (IM) NAIL INTERTROCHANTERIC Right 02/07/2015   Procedure: INTRAMEDULLARY (IM) NAIL INTERTROCHANTRIC RIGHT HIP;  Surgeon: Renette Butters, MD;  Location: [facility];  Service: Orthopedics;  Laterality: Right;  . KNEE ARTHROSCOPY Right   . LEFT HEART CATHETERIZATION WITH CORONARY/GRAFT ANGIOGRAM  07/01/2011   Procedure: LEFT HEART CATHETERIZATION WITH Beatrix Fetters;  Surgeon: Sanda Klein, MD;  Location: [facility] CATH LAB;  Service: Cardiovascular;;  . LOOP RECORDER IMPLANT  02/15/2014   MDT LINQ implanted by Dr Rayann Heman for cryptogenic stroke  . PERCUTANEOUS CORONARY STENT INTERVENTION (PCI-S) Right 07/01/2011   Procedure: PERCUTANEOUS CORONARY STENT INTERVENTION (PCI-S);  Surgeon: Sanda Klein, MD;   Location: [facility] CATH LAB;  Service: Cardiovascular;  Laterality: Right;  . PICC LINE PLACE PERIPHERAL ([place] HX)  06/2015  . RETINAL DETACHMENT SURGERY Right   . TEE WITHOUT CARDIOVERSION N/A 02/15/2014   Procedure: TRANSESOPHAGEAL ECHOCARDIOGRAM (TEE);  Surgeon: Josue Hector, MD;  Location: [facility] ENDOSCOPY;  Service: Cardiovascular;  Laterality: N/A;  . UMBILICAL HERNIA REPAIR  2006    There were no vitals filed for this visit.  Subjective Assessment - 07/07/19 1456    Subjective  Went to appointment for AFO; orthotist is not able to make a custom AFO so pt will need referral from physician and face to face note.  Goes to see neurology next week.  PT to reach out for referral and then will schedule appointment with orthotist.    Patient is accompained by:  Family member   wife   Pertinent History  DNR; anxiety & depression, CAD with CABG, diverticulosis, gout, HLD, HTN, left hemiplegia, NSTEMI (2013), morbid obesity, PE (2016), CVA (2015), tubular adenoma of colon, OSA w/CPAP, right hip fx w/ORIF (2016), iron deficiency anemia    Limitations  Standing;Walking    Patient Stated Goals  to obtain a Bioness for home use; to return to PLOF of ambulating household distances and short community distances with quad cane    Currently in Pain?  Yes                       [facility] Adult PT Treatment/Exercise - 07/07/19 1629      Transfers   Transfers  Sit to Stand;Stand to [employer] Transfers    Sit to Stand  4: Min assist    Sit to Stand Details (indicate cue type and reason)  UE support on // bars    Stand to Sit  4: Min assist    Stand to Sit Details  UE support on // bars    Stand Pivot Transfers  4: Min assist    Stand Pivot Transfer Details (indicate cue type and reason)  with UE support on // bars      Ambulation/Gait   Ambulation/Gait  Yes    Ambulation/Gait Assistance  3: Mod assist    Ambulation/Gait Assistance Details  in // bars with Bioness in gait mode to assess  effectiveness to provide open chain DF during swing phase and closed chain DF at initial > mid stance.  Pt demonstrating improved step length and weight shift over LLE during stance but continues to require assistance from therapist to fully clear L foot with increased hip and knee flexion and assistance to ABD LLE for improve placement of L foot and to prevent scissoring    Ambulation Distance (Feet)  12 Feet    Assistive device  Parallel bars    Gait Pattern  Step-through pattern;Decreased hip/knee flexion - left;Decreased dorsiflexion - left;Decreased weight shift to left;Narrow base of support;Lateral trunk lean to right;Decreased arm swing - left;Step-to pattern;Decreased step length - left    Ambulation Surface  Level;Indoor      Therapeutic Activites    Therapeutic Activities  Other Therapeutic Activities    Other Therapeutic Activities  discussed process for obtaining order for custom AFO; pt to see neurologist next week.  Will request that neurologist enter order for new AFO and include in face to face note and then PT will set up appointment with orthotist.      Modalities   Modalities  Electrical Stimulation      Electrical Stimulation   Electrical Stimulation Location  L anterior tibialis    Electrical Stimulation Action  open and closed chain ankle DF    Electrical Stimulation Parameters  see Tablet 2; changed to round cloth electrodes.  Adjusted settings multiple times to get contraction of ankle DF; little to no eversion observed    Electrical Stimulation Goals  Strength;Neuromuscular facilitation;Other (comment)   ROM            PT Education - 07/07/19 1637    Education Details  see TA    Person(s) Educated  Patient;Spouse    Methods  Explanation    Comprehension  Verbalized understanding       PT Short Term Goals - 07/06/19 1523      PT SHORT TERM GOAL #1   Title  Patient will be able to perform 2-3 exercises in standing for HEP  (RESET STG AFTER 30 DAYS)     Time  4    Period  Weeks    Status  Revised    Target Date  08/05/19      PT SHORT TERM GOAL #2   Title  Pt will be able to perform full TUG with cane    Baseline  1/2 TUG    Time  4    Period  Weeks    Status  Revised    Target Date  08/05/19      PT SHORT TERM GOAL #3   Title  Pt tolerates use of Bioness unit for transfer and gait training with quad cane and min A    Time  4    Period  Weeks    Status  Revised    Target Date  08/05/19      PT SHORT TERM GOAL #4   Title  Pt will improve gait velocity (over 10 foot distance) to >/= .5 ft/sec with cane and new AFO    Baseline  .17 ft/sec, no AFO with cane    Time  4    Period  Weeks    Status  Revised    Target Date  08/05/19      PT SHORT TERM GOAL #5   Title  Pt will consistently perform stand pivot transfers to L and R with cane and AFO with min A    Time  4    Period  Weeks    Status  Revised    Target Date  08/05/19        PT Long Term Goals - 07/06/19 1527      PT LONG TERM GOAL #1   Title  Patient and wife demonstrate and verbalizes independence with final HEP    Baseline  difficulty finding exercises for pt to do at home, sits and sleeps in recliner; no access to bed  Time  12    Period  Weeks    Status  Revised    Target Date  10/04/19      PT LONG TERM GOAL #2   Title  Pt will improve TUG with cane and Bioness/AFO to </= 60 seconds    Baseline  TBD for full TUG    Time  12    Period  Weeks    Status  Revised    Target Date  10/04/19      PT LONG TERM GOAL #3   Title  Patient will improve gait velocity over full distance with quad cane and most appropriate orthosis (bioness or AFO) to >0.8 ft/sec    Time  12    Period  Weeks    Status  Revised    Target Date  10/04/19      PT LONG TERM GOAL #4   Title  Patient demonstrates improved L passive ankle DF by at least 10 degrees.    Baseline  lacking 24 deg with knee extended, lacking 10 deg with knee flexed > Lacking 20 deg with knee extended,  lacking 16 deg with knee flexed    Time  12    Period  Weeks    Status  Revised    Target Date  10/04/19      PT LONG TERM GOAL #5   Title  Pt will demonstrate ability to transfer w/c <> mat/chair with quad cane, most appropriate orthosis and supervision    Baseline  min-mod A with quad cane    Time  12    Period  Weeks    Status  Revised    Target Date  10/04/19            Plan - 07/07/19 1638    Clinical Impression Statement  Performed trial of Bioness functional electrical stimulation to L lower leg for ankle DF again.  Pt demonstrated decreased edema in LLE and with smaller cloth electrodes and adjustments in settings, able to elicit slight activation of ankle DF but little to no activation of eversion.  Contraction was not able to be sustained.  Did trial gait with Bioness - will likely need to add upper unit for hamstring activation to improve knee flexion during terminal stance and hip extension through stance phase.  Will continue to assess if pt would benefit from use of Bioness at home but will proceed with AFO.    Personal Factors and Comorbidities  Age;Fitness;Past/Current Experience;Comorbidity 3+;Time since onset of injury/illness/exacerbation    Comorbidities  anixety & depression, CAD, diverticulosis, gout, HLD, HTN, left hemiplegia, NSTEMI (2013), morbid obesity, PE (2016), CVA (2015), tubular adenoma of colon, OSA w/CPAP, right hip fx w/ORIF (2016), iron deficiency anemia    Examination-Activity Limitations  Squat;Stairs;Stand;Dressing;Transfers;Bed Mobility;Locomotion Level    Examination-Participation Restrictions  Church;Community Activity    Stability/Clinical Decision Making  Evolving/Moderate complexity    Rehab Potential  Good    PT Frequency  2x / week    PT Duration  12 weeks    PT Treatment/Interventions  ADLs/Self Care Home Management;Electrical Stimulation;DME Instruction;Balance training;Therapeutic exercise;Orthotic Fit/Training;Gait  training;Neuromuscular re-education;Stair training;Functional mobility training;Patient/family education;Therapeutic activities;Manual techniques;Compression bandaging;Passive range of motion    PT Next Visit Plan  **DNR** assess vitals throughout session.  Add Hamstring to Bioness; Tablet 2.   Sit > stand training.  Stand pivots to L.  Gait with cane; endurance and standing tolerance.  Sidelying stretches and exercises with gravity minimized.    Consulted and Agree with  Plan of Care  Patient;Family member/caregiver    Family Member Consulted  spouse       Patient will benefit from skilled therapeutic intervention in order to improve the following deficits and impairments:  Abnormal gait, Decreased range of motion, Difficulty walking, Impaired tone, Obesity, Impaired UE functional use, Decreased endurance, Decreased activity tolerance, Decreased skin integrity, Decreased balance, Decreased knowledge of use of DME, Impaired flexibility, Postural dysfunction, Impaired sensation, Increased edema, Decreased strength, Decreased mobility  Visit Diagnosis: Repeated falls  Other abnormalities of gait and mobility  Muscle weakness (generalized)  Unsteadiness on feet  Abnormal posture  Foot drop, left  Spastic hemiplegia of left nondominant side as late effect of cerebral infarction (HCC)  Stiffness of left ankle, not elsewhere classified     Problem List Patient Active Problem List   Diagnosis Date Noted  . GERD (gastroesophageal reflux disease) 06/18/2019  . Tinea cruris 07/20/2017  . Hemorrhoid 07/20/2017  . Irritability 06/08/2017  . Fall at home 05/06/2017  . Advance care planning 11/28/2016  . Skin mass 11/28/2016  . Lower urinary tract symptoms (LUTS) 09/05/2016  . Hearing loss 09/05/2016  . Excessive daytime sleepiness 04/16/2016  . Exposure to TB 02/21/2016  . Vitamin D deficiency 02/21/2016  . Anemia 11/10/2015  . Left thigh pain 11/10/2015  . Neck pain 10/12/2015  .  Hemiplegia affecting left side in left-dominant patient as late effect of cerebrovascular disease ([place]) 09/03/2015  . Medicare annual wellness visit, subsequent 08/14/2015  . Acute pulmonary embolism ([place]) 07/25/2015  . Chest wall pain 07/15/2015  . History of pulmonary embolism 07/15/2015  . Chronic anticoagulation 05/22/2015  . Gait disturbance 05/15/2015  . DNR (do not resuscitate) 01/19/2015  . Hypotension 01/14/2015  . Depression 12/14/2014  . OSA (obstructive sleep apnea) 11/30/2014  . Chronic combined systolic and diastolic congestive heart failure ([place]) 11/11/2014  . SOB (shortness of breath) 04/17/2014  . Hemiplegia affecting left dominant side ([place]) 02/16/2014  . 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 06/30/2011    Gregory Wiley, PT, DPT 07/07/19    4:44 PM    [facility] [address] [place], [place], [address] Phone: [phone]   Fax:  [phone]  Name: Gregory Wiley MRN: [id number] Date of Birth: 08/30/1950

## 2019-07-07 NOTE — Telephone Encounter (Signed)
Sent. Thanks.   

## 2019-07-07 NOTE — Telephone Encounter (Signed)
Received two request for iron ast refill on Iron 65 BID last refill 06/02/19 #180 Last office visit 06/17/19

## 2019-07-07 NOTE — Telephone Encounter (Signed)
Hello Dr. Felecia Shelling, Gregory Wiley is scheduled to see you next Wednesday, the 28th.  I wanted to see if during that visit you could address his need for a new AFO?  His old AFO is 70 years old and is no longer appropriate for his deficits.  I think he would benefit from a custom molded AFO.    If you agree after you see him, would you please enter an order into Epic for L custom molded AFO and include it in your face to face note for that visit?    Thank you! [name], PT, DPT 07/07/19    5:07 PM

## 2019-07-12 ENCOUNTER — Other Ambulatory Visit: Payer: Self-pay

## 2019-07-12 ENCOUNTER — Ambulatory Visit: Payer: Medicare Other | Admitting: Physical Therapy

## 2019-07-12 DIAGNOSIS — M6281 Muscle weakness (generalized): Secondary | ICD-10-CM

## 2019-07-12 DIAGNOSIS — R293 Abnormal posture: Secondary | ICD-10-CM

## 2019-07-12 DIAGNOSIS — R296 Repeated falls: Secondary | ICD-10-CM | POA: Diagnosis not present

## 2019-07-12 DIAGNOSIS — R2689 Other abnormalities of gait and mobility: Secondary | ICD-10-CM

## 2019-07-12 DIAGNOSIS — R2681 Unsteadiness on feet: Secondary | ICD-10-CM

## 2019-07-12 DIAGNOSIS — M21372 Foot drop, left foot: Secondary | ICD-10-CM

## 2019-07-12 DIAGNOSIS — M25672 Stiffness of left ankle, not elsewhere classified: Secondary | ICD-10-CM

## 2019-07-12 DIAGNOSIS — I69354 Hemiplegia and hemiparesis following cerebral infarction affecting left non-dominant side: Secondary | ICD-10-CM

## 2019-07-13 NOTE — Therapy (Signed)
[facility] [address] [place], [place], [address] Phone: [phone]   Fax:  [phone]  Physical Therapy Treatment  Patient Details  Name: Gregory Wiley MRN: [id number] Date of Birth: 14-Jun-1950 Referring Provider (PT): Felecia Shelling, Nanine Means, MD   Encounter Date: 07/12/2019  PT End of Session - 07/13/19 1520    Visit Number  12    Number of Visits  34    Date for PT Re-Evaluation  10/04/19    Authorization Type  [employer] Medicare - 10th visit PN    Progress Note Due on Visit  20    PT Start Time  1328    PT Stop Time  1412    PT Time Calculation (min)  44 min    Equipment Utilized During Treatment  Gait belt;Other (comment)   Bioness   Activity Tolerance  Patient tolerated treatment well    Behavior During Therapy  WFL for tasks assessed/performed       Past Medical History:  Diagnosis Date  . Adrenal insufficiency ([place])   . Anxiety   . Basal cell carcinoma of skin of left ankle   . Bleeding stomach ulcer 2015  . Coronary artery disease    stent, then CABG 07/20/10  . Daily headache    "for the past month" (11/11/2014)  . Depression   . Diverticulosis   . GERD (gastroesophageal reflux disease)   . Gout   . H/O cardiomyopathy    ischemic, now last echo 07/30/11, EF 38%W  . History of hiatal hernia   . Hyperlipidemia   . Hypertension   . Internal hemorrhoids   . Left hemiplegia ([place])   . NSTEMI (non-ST elevated myocardial infarction) ([place])    NSTEMI- last cath 06/2011-Stent to LCX-DES, last nuc 07/30/11 low risk  . Obesity (BMI 30-39.9)   . OSA on CPAP    since July 2013- uses CPAP sometimes , states he has lost 147 lbs. since stroke & doesn't use the CPAP as much as he use to.   . Plantar fasciitis of left foot   . Pneumonia    hosp. 12/2014  . Pulmonary embolism ([place]) 03/2014  . Stroke ([place]) 01/2014   "no control of LUE, can slightly LLE; memory problems since" (11/11/2014)  . Tubular adenoma of colon      Past Surgical History:  Procedure Laterality Date  . BASAL CELL CARCINOMA EXCISION Left 2015   ankle  . CARDIAC SURGERY    . CATARACT EXTRACTION Bilateral   . COLONOSCOPY N/A 02/10/2013   Procedure: COLONOSCOPY;  Surgeon: Ladene Artist, MD;  Location: [facility];  Service: Endoscopy;  Laterality: N/A;  . CORONARY ANGIOPLASTY WITH STENT PLACEMENT  07/01/2011   DES-Resolute to native LCX  . CORONARY ANGIOPLASTY WITH STENT PLACEMENT  12/01/2007   COMPLEX 5 LESION PCI INCLUDING CUTTING BALLOON AND 4 CYPHER DESs  . CORONARY ARTERY BYPASS GRAFT  07/20/2010    LIMA-LAD; VG-ACUTE MARG of RCA; Seq VG-distal RCA & then pda  . EP IMPLANTABLE DEVICE N/A 02/14/2016   Procedure: Loop Recorder Removal;  Surgeon: Thompson Grayer, MD;  Location: [facility];  Service: Cardiovascular;  Laterality: N/A;  . ESOPHAGOGASTRODUODENOSCOPY  01/2014   gastric ulcer, erosive gastroduodenitis, H Pylori negative.   . ESOPHAGOGASTRODUODENOSCOPY (EGD) WITH PROPOFOL N/A 11/14/2014   Procedure: ESOPHAGOGASTRODUODENOSCOPY (EGD) WITH PROPOFOL;  Surgeon: Milus Banister, MD;  Location: [facility];  Service: Endoscopy;  Laterality: N/A;  . HAND SURGERY Right    to  take glass out  . HERNIA REPAIR    . INTRAMEDULLARY (IM) NAIL INTERTROCHANTERIC Right 02/07/2015   Procedure: INTRAMEDULLARY (IM) NAIL INTERTROCHANTRIC RIGHT HIP;  Surgeon: Renette Butters, MD;  Location: [facility];  Service: Orthopedics;  Laterality: Right;  . KNEE ARTHROSCOPY Right   . LEFT HEART CATHETERIZATION WITH CORONARY/GRAFT ANGIOGRAM  07/01/2011   Procedure: LEFT HEART CATHETERIZATION WITH Beatrix Fetters;  Surgeon: Sanda Klein, MD;  Location: [facility]ATH LAB;  Service: Cardiovascular;;  . LOOP RECORDER IMPLANT  02/15/2014   MDT LINQ implanted by Dr Rayann Heman for cryptogenic stroke  . PERCUTANEOUS CORONARY STENT INTERVENTION (PCI-S) Right 07/01/2011   Procedure: PERCUTANEOUS CORONARY STENT INTERVENTION (PCI-S);  Surgeon: Sanda Klein, MD;   Location: [facility] CATH LAB;  Service: Cardiovascular;  Laterality: Right;  . PICC LINE PLACE PERIPHERAL ([place] HX)  06/2015  . RETINAL DETACHMENT SURGERY Right   . TEE WITHOUT CARDIOVERSION N/A 02/15/2014   Procedure: TRANSESOPHAGEAL ECHOCARDIOGRAM (TEE);  Surgeon: Josue Hector, MD;  Location: [facility] ENDOSCOPY;  Service: Cardiovascular;  Laterality: N/A;  . UMBILICAL HERNIA REPAIR  2006    There were no vitals filed for this visit.  Subjective Assessment - 07/13/19 1508    Subjective  No falls this weekend.  Pt reports he was able to walk into the bathroom and out about 12 feet without his L foot crossing over.   Changed PT appointment from Thursday > Wednesday before Neurology appointment.    Patient is accompained by:  Family member   wife   Pertinent History  DNR; anxiety & depression, CAD with CABG, diverticulosis, gout, HLD, HTN, left hemiplegia, NSTEMI (2013), morbid obesity, PE (2016), CVA (2015), tubular adenoma of colon, OSA w/CPAP, right hip fx w/ORIF (2016), iron deficiency anemia    Limitations  Standing;Walking    Patient Stated Goals  to obtain a Bioness for home use; to return to PLOF of ambulating household distances and short community distances with quad cane    Currently in Pain?  No/denies                       [facility] Adult PT Treatment/Exercise - 07/12/19 1509      Transfers   Transfers  Sit to Stand;Stand to Sit;Stand Pivot Transfers    Sit to Stand  4: Min assist;3: Mod assist    Sit to Stand Details (indicate cue type and reason)  from low transport chair, requires mod A to bring COG fully anterior    Stand to Sit  4: Min assist    Stand Pivot Transfers  4: Min assist      Ambulation/Gait   Ambulation/Gait  Yes    Ambulation/Gait Assistance  3: Mod assist    Ambulation/Gait Assistance Details  in parallel bars with Bioness in gait mode with updated settings and addition of quad upper cuff.  Still unable to elicit sufficient ankle DF to prevent toe drag at  intial swing phase; pt continued to have mild scissoring and reported adductor muscle activation - returned pt to sitting and altered placement of quad electrodes more lateral.  With upper cuff pt demonstrated improved stance phase control.      Ambulation Distance (Feet)  12 Feet    Assistive device  Parallel bars    Gait Pattern  Step-through pattern;Decreased hip/knee flexion - left;Decreased dorsiflexion - left;Decreased weight shift to left;Narrow base of support;Lateral trunk lean to right;Step-to pattern;Decreased step length - left;Trunk flexed;Poor foot clearance - left    Ambulation Surface  Level;Indoor      Neuro Re-ed    Neuro Re-ed Details   With Bioness in training mode with lower cuff stimulating only: performed 10 reps of 5 seconds on, 6 seconds off anterior tibialis stimulation with pt focusing on assisted activation of L ankle DF and eversion.        Modalities   Modalities  Electrical engineer Stimulation Location  L anterior tibialis and L quad muscles    Electrical Stimulation Action  open and closed chain ankle DF, knee extension    Electrical Stimulation Parameters  extra time spent on the phone with Bioness representative: Excell Seltzer to discuss current set up of electrodes and parameters and adjustments to try to improve nerve and muscle stimulation and activation for ankle DF.  Electrodes moved but parameters remained the same for activation.  Pt continues to have sudden, brief activation but no sustained ankle DF at higher intensity.  Representative recommended use of Bioness in training mode with stationary bike to decrease edema in LLE. Did not have time to trial Nustep with Bioness today.    Electrical Stimulation Goals  Strength;Tone;Edema;Neuromuscular facilitation;Other (comment)   ROM            PT Education - 07/13/19 1519    Education Details  moved appointment from Thursday to Wed; addition of more  visits; Bioness adjustments    Person(s) Educated  Patient;Spouse    Methods  Explanation    Comprehension  Verbalized understanding       PT Short Term Goals - 07/06/19 1523      PT SHORT TERM GOAL #1   Title  Patient will be able to perform 2-3 exercises in standing for HEP  (RESET STG AFTER 30 DAYS)    Time  4    Period  Weeks    Status  Revised    Target Date  08/05/19      PT SHORT TERM GOAL #2   Title  Pt will be able to perform full TUG with cane    Baseline  1/2 TUG    Time  4    Period  Weeks    Status  Revised    Target Date  08/05/19      PT SHORT TERM GOAL #3   Title  Pt tolerates use of Bioness unit for transfer and gait training with quad cane and min A    Time  4    Period  Weeks    Status  Revised    Target Date  08/05/19      PT SHORT TERM GOAL #4   Title  Pt will improve gait velocity (over 10 foot distance) to >/= .5 ft/sec with cane and new AFO    Baseline  .17 ft/sec, no AFO with cane    Time  4    Period  Weeks    Status  Revised    Target Date  08/05/19      PT SHORT TERM GOAL #5   Title  Pt will consistently perform stand pivot transfers to L and R with cane and AFO with min A    Time  4    Period  Weeks    Status  Revised    Target Date  08/05/19        PT Long Term Goals - 07/06/19 1527      PT LONG TERM GOAL #1   Title  Patient and  wife demonstrate and verbalizes independence with final HEP    Baseline  difficulty finding exercises for pt to do at home, sits and sleeps in recliner; no access to bed    Time  12    Period  Weeks    Status  Revised    Target Date  10/04/19      PT LONG TERM GOAL #2   Title  Pt will improve TUG with cane and Bioness/AFO to </= 60 seconds    Baseline  TBD for full TUG    Time  12    Period  Weeks    Status  Revised    Target Date  10/04/19      PT LONG TERM GOAL #3   Title  Patient will improve gait velocity over full distance with quad cane and most appropriate orthosis (bioness or AFO) to  >0.8 ft/sec    Time  12    Period  Weeks    Status  Revised    Target Date  10/04/19      PT LONG TERM GOAL #4   Title  Patient demonstrates improved L passive ankle DF by at least 10 degrees.    Baseline  lacking 24 deg with knee extended, lacking 10 deg with knee flexed > Lacking 20 deg with knee extended, lacking 16 deg with knee flexed    Time  12    Period  Weeks    Status  Revised    Target Date  10/04/19      PT LONG TERM GOAL #5   Title  Pt will demonstrate ability to transfer w/c <> mat/chair with quad cane, most appropriate orthosis and supervision    Baseline  min-mod A with quad cane    Time  12    Period  Weeks    Status  Revised    Target Date  10/04/19            Plan - 07/13/19 1520    Clinical Impression Statement  Continued to problem solve and trial use of Bioness on LLE for ankle DF and added upper cuff today for quad activation for knee extension.  With adjustments still only able to elicit quick, brief activation of L ankle DF.  Pt may continue to be limited due to edema.  Will continue to address and progress towards LTG.    Personal Factors and Comorbidities  Age;Fitness;Past/Current Experience;Comorbidity 3+;Time since onset of injury/illness/exacerbation    Comorbidities  anixety & depression, CAD, diverticulosis, gout, HLD, HTN, left hemiplegia, NSTEMI (2013), morbid obesity, PE (2016), CVA (2015), tubular adenoma of colon, OSA w/CPAP, right hip fx w/ORIF (2016), iron deficiency anemia    Examination-Activity Limitations  Squat;Stairs;Stand;Dressing;Transfers;Bed Mobility;Locomotion Level    Examination-Participation Restrictions  Church;Community Activity    Stability/Clinical Decision Making  Evolving/Moderate complexity    Rehab Potential  Good    PT Frequency  2x / week    PT Duration  12 weeks    PT Treatment/Interventions  ADLs/Self Care Home Management;Electrical Stimulation;DME Instruction;Balance training;Therapeutic exercise;Orthotic  Fit/Training;Gait training;Neuromuscular re-education;Stair training;Functional mobility training;Patient/family education;Therapeutic activities;Manual techniques;Compression bandaging;Passive range of motion    PT Next Visit Plan  **DNR** assess vitals throughout session.  Bioness in training mode on Scifit or Nustep for edema management and ROM?  Sit > stand training.  Stand pivots to L.  Gait with cane; endurance and standing tolerance.  Sidelying stretches and exercises with gravity minimized.    Consulted and Agree with Plan of Care  Patient;Family  member/caregiver    Family Member Consulted  spouse       Patient will benefit from skilled therapeutic intervention in order to improve the following deficits and impairments:  Abnormal gait, Decreased range of motion, Difficulty walking, Impaired tone, Obesity, Impaired UE functional use, Decreased endurance, Decreased activity tolerance, Decreased skin integrity, Decreased balance, Decreased knowledge of use of DME, Impaired flexibility, Postural dysfunction, Impaired sensation, Increased edema, Decreased strength, Decreased mobility  Visit Diagnosis: Repeated falls  Other abnormalities of gait and mobility  Muscle weakness (generalized)  Unsteadiness on feet  Abnormal posture  Foot drop, left  Spastic hemiplegia of left nondominant side as late effect of cerebral infarction (HCC)  Stiffness of left ankle, not elsewhere classified     Problem List Patient Active Problem List   Diagnosis Date Noted  . GERD (gastroesophageal reflux disease) 06/18/2019  . Tinea cruris 07/20/2017  . Hemorrhoid 07/20/2017  . Irritability 06/08/2017  . Fall at home 05/06/2017  . Advance care planning 11/28/2016  . Skin mass 11/28/2016  . Lower urinary tract symptoms (LUTS) 09/05/2016  . Hearing loss 09/05/2016  . Excessive daytime sleepiness 04/16/2016  . Exposure to TB 02/21/2016  . Vitamin D deficiency 02/21/2016  . Anemia 11/10/2015  .  Left thigh pain 11/10/2015  . Neck pain 10/12/2015  . Hemiplegia affecting left side in left-dominant patient as late effect of cerebrovascular disease ([place]) 09/03/2015  . Medicare annual wellness visit, subsequent 08/14/2015  . Acute pulmonary embolism ([place]) 07/25/2015  . Chest wall pain 07/15/2015  . History of pulmonary embolism 07/15/2015  . Chronic anticoagulation 05/22/2015  . Gait disturbance 05/15/2015  . DNR (do not resuscitate) 01/19/2015  . Hypotension 01/14/2015  . Depression 12/14/2014  . OSA (obstructive sleep apnea) 11/30/2014  . Chronic combined systolic and diastolic congestive heart failure ([place]) 11/11/2014  . SOB (shortness of breath) 04/17/2014  . Hemiplegia affecting left dominant side ([place]) 02/16/2014  . 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 06/30/2011   [name], PT, DPT 07/13/19    3:27 PM   [facility] [address] [place], [place], [address] Phone: [phone]   Fax:  [phone]  Name: Gregory Wiley MRN: [id number] Date of Birth: 08-19-1950

## 2019-07-14 ENCOUNTER — Ambulatory Visit (INDEPENDENT_AMBULATORY_CARE_PROVIDER_SITE_OTHER): Payer: Medicare Other | Admitting: Neurology

## 2019-07-14 ENCOUNTER — Ambulatory Visit: Payer: Medicare Other | Admitting: Physical Therapy

## 2019-07-14 ENCOUNTER — Encounter: Payer: Self-pay | Admitting: Neurology

## 2019-07-14 ENCOUNTER — Other Ambulatory Visit: Payer: Self-pay

## 2019-07-14 ENCOUNTER — Encounter: Payer: Self-pay | Admitting: Physical Therapy

## 2019-07-14 VITALS — BP 118/70 | HR 72 | Temp 99.6°F | Ht 72.0 in | Wt 258.5 lb

## 2019-07-14 DIAGNOSIS — R2689 Other abnormalities of gait and mobility: Secondary | ICD-10-CM

## 2019-07-14 DIAGNOSIS — R269 Unspecified abnormalities of gait and mobility: Secondary | ICD-10-CM

## 2019-07-14 DIAGNOSIS — R296 Repeated falls: Secondary | ICD-10-CM | POA: Diagnosis not present

## 2019-07-14 DIAGNOSIS — M6281 Muscle weakness (generalized): Secondary | ICD-10-CM

## 2019-07-14 DIAGNOSIS — G4733 Obstructive sleep apnea (adult) (pediatric): Secondary | ICD-10-CM | POA: Diagnosis not present

## 2019-07-14 DIAGNOSIS — M21372 Foot drop, left foot: Secondary | ICD-10-CM

## 2019-07-14 DIAGNOSIS — G4719 Other hypersomnia: Secondary | ICD-10-CM

## 2019-07-14 DIAGNOSIS — I69354 Hemiplegia and hemiparesis following cerebral infarction affecting left non-dominant side: Secondary | ICD-10-CM

## 2019-07-14 DIAGNOSIS — R202 Paresthesia of skin: Secondary | ICD-10-CM | POA: Diagnosis not present

## 2019-07-14 DIAGNOSIS — M25672 Stiffness of left ankle, not elsewhere classified: Secondary | ICD-10-CM

## 2019-07-14 DIAGNOSIS — R2681 Unsteadiness on feet: Secondary | ICD-10-CM

## 2019-07-14 DIAGNOSIS — Z7901 Long term (current) use of anticoagulants: Secondary | ICD-10-CM

## 2019-07-14 DIAGNOSIS — I69952 Hemiplegia and hemiparesis following unspecified cerebrovascular disease affecting left dominant side: Secondary | ICD-10-CM | POA: Diagnosis not present

## 2019-07-14 MED ORDER — GABAPENTIN 300 MG PO CAPS: ORAL_CAPSULE | ORAL | 11 refills | Status: DC

## 2019-07-14 NOTE — Therapy (Signed)
[facility] [address] [place], [place], [address] Phone: [phone]   Fax:  [phone]  Physical Therapy Treatment  Patient Details  Name: Gregory Wiley MRN: [id number] Date of Birth: 1950-07-06 Referring Provider (PT): Felecia Shelling, Nanine Means, MD   Encounter Date: 07/14/2019  PT End of Session - 07/14/19 1649    Visit Number  13    Number of Visits  34    Date for PT Re-Evaluation  10/04/19    Authorization Type  [employer] Medicare - 10th visit PN    Progress Note Due on Visit  20    PT Start Time  1235    PT Stop Time  1320    PT Time Calculation (min)  45 min    Equipment Utilized During Treatment  Gait belt;Other (comment)   Bioness   Activity Tolerance  Patient tolerated treatment well    Behavior During Therapy  WFL for tasks assessed/performed       Past Medical History:  Diagnosis Date  . Adrenal insufficiency ([place])   . Anxiety   . Basal cell carcinoma of skin of left ankle   . Bleeding stomach ulcer 2015  . Coronary artery disease    stent, then CABG 07/20/10  . Daily headache    "for the past month" (11/11/2014)  . Depression   . Diverticulosis   . GERD (gastroesophageal reflux disease)   . Gout   . H/O cardiomyopathy    ischemic, now last echo 07/30/11, EF 38%W  . History of hiatal hernia   . Hyperlipidemia   . Hypertension   . Internal hemorrhoids   . Left hemiplegia ([place])   . NSTEMI (non-ST elevated myocardial infarction) ([place])    NSTEMI- last cath 06/2011-Stent to LCX-DES, last nuc 07/30/11 low risk  . Obesity (BMI 30-39.9)   . OSA on CPAP    since July 2013- uses CPAP sometimes , states he has lost 147 lbs. since stroke & doesn't use the CPAP as much as he use to.   . Plantar fasciitis of left foot   . Pneumonia    hosp. 12/2014  . Pulmonary embolism ([place]) 03/2014  . Stroke ([place]) 01/2014   "no control of LUE, can slightly LLE; memory problems since" (11/11/2014)  . Tubular adenoma of colon      Past Surgical History:  Procedure Laterality Date  . BASAL CELL CARCINOMA EXCISION Left 2015   ankle  . CARDIAC SURGERY    . CATARACT EXTRACTION Bilateral   . COLONOSCOPY N/A 02/10/2013   Procedure: COLONOSCOPY;  Surgeon: Ladene Artist, MD;  Location: [facility];  Service: Endoscopy;  Laterality: N/A;  . CORONARY ANGIOPLASTY WITH STENT PLACEMENT  07/01/2011   DES-Resolute to native LCX  . CORONARY ANGIOPLASTY WITH STENT PLACEMENT  12/01/2007   COMPLEX 5 LESION PCI INCLUDING CUTTING BALLOON AND 4 CYPHER DESs  . CORONARY ARTERY BYPASS GRAFT  07/20/2010    LIMA-LAD; VG-ACUTE MARG of RCA; Seq VG-distal RCA & then pda  . EP IMPLANTABLE DEVICE N/A 02/14/2016   Procedure: Loop Recorder Removal;  Surgeon: Thompson Grayer, MD;  Location: [facility];  Service: Cardiovascular;  Laterality: N/A;  . ESOPHAGOGASTRODUODENOSCOPY  01/2014   gastric ulcer, erosive gastroduodenitis, H Pylori negative.   . ESOPHAGOGASTRODUODENOSCOPY (EGD) WITH PROPOFOL N/A 11/14/2014   Procedure: ESOPHAGOGASTRODUODENOSCOPY (EGD) WITH PROPOFOL;  Surgeon: Milus Banister, MD;  Location: [facility];  Service: Endoscopy;  Laterality: N/A;  . HAND SURGERY Right    to  take glass out  . HERNIA REPAIR    . INTRAMEDULLARY (IM) NAIL INTERTROCHANTERIC Right 02/07/2015   Procedure: INTRAMEDULLARY (IM) NAIL INTERTROCHANTRIC RIGHT HIP;  Surgeon: Renette Butters, MD;  Location: [facility];  Service: Orthopedics;  Laterality: Right;  . KNEE ARTHROSCOPY Right   . LEFT HEART CATHETERIZATION WITH CORONARY/GRAFT ANGIOGRAM  07/01/2011   Procedure: LEFT HEART CATHETERIZATION WITH Beatrix Fetters;  Surgeon: Sanda Klein, MD;  Location: [facility];  Service: Cardiovascular;;  . LOOP RECORDER IMPLANT  02/15/2014   MDT LINQ implanted by Dr Rayann Heman for cryptogenic stroke  . PERCUTANEOUS CORONARY STENT INTERVENTION (PCI-S) Right 07/01/2011   Procedure: PERCUTANEOUS CORONARY STENT INTERVENTION (PCI-S);  Surgeon: Sanda Klein, MD;   Location: [facility];  Service: Cardiovascular;  Laterality: Right;  . PICC LINE PLACE PERIPHERAL ([place] HX)  06/2015  . RETINAL DETACHMENT SURGERY Right   . TEE WITHOUT CARDIOVERSION N/A 02/15/2014   Procedure: TRANSESOPHAGEAL ECHOCARDIOGRAM (TEE);  Surgeon: Josue Hector, MD;  Location: [facility] ENDOSCOPY;  Service: Cardiovascular;  Laterality: N/A;  . UMBILICAL HERNIA REPAIR  2006    There were no vitals filed for this visit.  Subjective Assessment - 07/14/19 1627    Subjective  Pt not feeling well but thinks it is allergies.  Goes to see neurology after PT, will discuss AFO with neurology.    Patient is accompained by:  Family member   wife   Pertinent History  DNR; anxiety & depression, CAD with CABG, diverticulosis, gout, HLD, HTN, left hemiplegia, NSTEMI (2013), morbid obesity, PE (2016), CVA (2015), tubular adenoma of colon, OSA w/CPAP, right hip fx w/ORIF (2016), iron deficiency anemia    Limitations  Standing;Walking    Patient Stated Goals  to obtain a Bioness for home use; to return to PLOF of ambulating household distances and short community distances with quad cane    Currently in Pain?  No/denies                       [facility] Adult PT Treatment/Exercise - 07/14/19 1629      Transfers   Transfers  Sit to Stand;Stand to Sit;Stand Pivot Transfers    Sit to Stand  5: Supervision;4: Min assist    Sit to Stand Details (indicate cue type and reason)  Min A from transport wheelchair but supervision to stand from [employer] to Sit  4: Clinical cytogeneticist Transfers  4: Min Doctor, general practice Details (indicate cue type and reason)  with cane from w/c <> Nustep      Ambulation/Gait   Ambulation/Gait  Yes    Ambulation/Gait Assistance  4: Min assist;3: Mod assist    Ambulation/Gait Assistance Details  with cane with one sitting rest break; initially PT assisting with placement of L foot to prevent scissoring but pt reported it was "getting in his  way"; PT transitioned to assisting on L side for weight shifting and upright trunk and LLE extension in L stance phase with cues to initiate stepping with RLE; when therapist not assisting with placement of L foot pt experienced increased scissoring    Ambulation Distance (Feet)  12 Feet    Assistive device  Small based quad cane    Gait Pattern  Step-to pattern;Step-through pattern;Decreased step length - left;Decreased stance time - left;Decreased hip/knee flexion - left;Decreased dorsiflexion - left;Poor foot clearance - left;Scissoring    Ambulation Surface  Level;Indoor  Neuro Re-ed    Neuro Re-ed Details   With Bioness in training mode with lower cuff stimulating only: performed 10 reps of 5 seconds on, 6 seconds off anterior tibialis stimulation with pt focusing on assisted activation of L ankle DF and eversion.  Improved activation today of ankle DF.      Knee/Hip Exercises: Aerobic   Nustep  Level 4 with bilat LE and Bioness in training mode with pt performing reciprocal extension x 7 minutes with therapist providing assistance to maintain neutral L hip rotation and cues for full extension through LLE; Use of Nustep to attempt to decrease edema in LLE to improve activation of ankle DF      Modalities   Modalities  Electrical Stimulation      Electrical Stimulation   Electrical Stimulation Location  L anterior tibialis and L quad muscles    Electrical Stimulation Action  open and closed chain ankle DF, knee extension, tablet 2, round cloth electrodes.     Electrical Stimulation Parameters  see tablet 2    Electrical Stimulation Goals  Strength;Tone;Edema;Neuromuscular facilitation;Other (comment)   edema            PT Education - 07/14/19 1648    Education Details  will set up appointment for AFO assessment once order and face to face with neurologist are completed    Person(s) Educated  Patient;Spouse    Methods  Explanation    Comprehension  Verbalized understanding        PT Short Term Goals - 07/06/19 1523      PT SHORT TERM GOAL #1   Title  Patient will be able to perform 2-3 exercises in standing for HEP  (RESET STG AFTER 30 DAYS)    Time  4    Period  Weeks    Status  Revised    Target Date  08/05/19      PT SHORT TERM GOAL #2   Title  Pt will be able to perform full TUG with cane    Baseline  1/2 TUG    Time  4    Period  Weeks    Status  Revised    Target Date  08/05/19      PT SHORT TERM GOAL #3   Title  Pt tolerates use of Bioness unit for transfer and gait training with quad cane and min A    Time  4    Period  Weeks    Status  Revised    Target Date  08/05/19      PT SHORT TERM GOAL #4   Title  Pt will improve gait velocity (over 10 foot distance) to >/= .5 ft/sec with cane and new AFO    Baseline  .17 ft/sec, no AFO with cane    Time  4    Period  Weeks    Status  Revised    Target Date  08/05/19      PT SHORT TERM GOAL #5   Title  Pt will consistently perform stand pivot transfers to L and R with cane and AFO with min A    Time  4    Period  Weeks    Status  Revised    Target Date  08/05/19        PT Long Term Goals - 07/06/19 1527      PT LONG TERM GOAL #1   Title  Patient and wife demonstrate and verbalizes independence with final HEP    Baseline  difficulty finding exercises for pt to do at home, sits and sleeps in recliner; no access to bed    Time  12    Period  Weeks    Status  Revised    Target Date  10/04/19      PT LONG TERM GOAL #2   Title  Pt will improve TUG with cane and Bioness/AFO to </= 60 seconds    Baseline  TBD for full TUG    Time  12    Period  Weeks    Status  Revised    Target Date  10/04/19      PT LONG TERM GOAL #3   Title  Patient will improve gait velocity over full distance with quad cane and most appropriate orthosis (bioness or AFO) to >0.8 ft/sec    Time  12    Period  Weeks    Status  Revised    Target Date  10/04/19      PT LONG TERM GOAL #4   Title  Patient  demonstrates improved L passive ankle DF by at least 10 degrees.    Baseline  lacking 24 deg with knee extended, lacking 10 deg with knee flexed > Lacking 20 deg with knee extended, lacking 16 deg with knee flexed    Time  12    Period  Weeks    Status  Revised    Target Date  10/04/19      PT LONG TERM GOAL #5   Title  Pt will demonstrate ability to transfer w/c <> mat/chair with quad cane, most appropriate orthosis and supervision    Baseline  min-mod A with quad cane    Time  12    Period  Weeks    Status  Revised    Target Date  10/04/19            Plan - 07/14/19 1649    Clinical Impression Statement  Pt demonstrated increased active ankle DF on L side even without functional electrical stimulation.  Continued to utilize Bioness functional electrical stimulation on LLE in combination with Nustep for edema management, NMR for ankle DF activation and gait training.  Pt fatigued quickly with gait after performing Nustep.  Will continue to address and attempt to progress with Bioness.    Personal Factors and Comorbidities  Age;Fitness;Past/Current Experience;Comorbidity 3+;Time since onset of injury/illness/exacerbation    Comorbidities  anixety & depression, CAD, diverticulosis, gout, HLD, HTN, left hemiplegia, NSTEMI (2013), morbid obesity, PE (2016), CVA (2015), tubular adenoma of colon, OSA w/CPAP, right hip fx w/ORIF (2016), iron deficiency anemia    Examination-Activity Limitations  Squat;Stairs;Stand;Dressing;Transfers;Bed Mobility;Locomotion Level    Examination-Participation Restrictions  Church;Community Activity    Stability/Clinical Decision Making  Evolving/Moderate complexity    Rehab Potential  Good    PT Frequency  2x / week    PT Duration  12 weeks    PT Treatment/Interventions  ADLs/Self Care Home Management;Electrical Stimulation;DME Instruction;Balance training;Therapeutic exercise;Orthotic Fit/Training;Gait training;Neuromuscular re-education;Stair  training;Functional mobility training;Patient/family education;Therapeutic activities;Manual techniques;Compression bandaging;Passive range of motion    PT Next Visit Plan  **DNR** assess vitals throughout session.  Bioness in training mode on Nustep for edema management and ROM?  Sit > stand training.  Stand pivots to L.  Gait with cane; endurance and standing tolerance.  Sidelying stretches and exercises with gravity minimized.    Consulted and Agree with Plan of Care  Patient;Family member/caregiver    Family Member Consulted  spouse  Patient will benefit from skilled therapeutic intervention in order to improve the following deficits and impairments:  Abnormal gait, Decreased range of motion, Difficulty walking, Impaired tone, Obesity, Impaired UE functional use, Decreased endurance, Decreased activity tolerance, Decreased skin integrity, Decreased balance, Decreased knowledge of use of DME, Impaired flexibility, Postural dysfunction, Impaired sensation, Increased edema, Decreased strength, Decreased mobility  Visit Diagnosis: Repeated falls  Other abnormalities of gait and mobility  Muscle weakness (generalized)  Unsteadiness on feet  Foot drop, left  Spastic hemiplegia of left nondominant side as late effect of cerebral infarction (HCC)  Stiffness of left ankle, not elsewhere classified     Problem List Patient Active Problem List   Diagnosis Date Noted  . Left foot drop 07/14/2019  . GERD (gastroesophageal reflux disease) 06/18/2019  . Tinea cruris 07/20/2017  . Hemorrhoid 07/20/2017  . Irritability 06/08/2017  . Fall at home 05/06/2017  . Advance care planning 11/28/2016  . Skin mass 11/28/2016  . Lower urinary tract symptoms (LUTS) 09/05/2016  . Hearing loss 09/05/2016  . Excessive daytime sleepiness 04/16/2016  . Exposure to TB 02/21/2016  . Vitamin D deficiency 02/21/2016  . Anemia 11/10/2015  . Left thigh pain 11/10/2015  . Neck pain 10/12/2015  .  Hemiplegia affecting left side in left-dominant patient as late effect of cerebrovascular disease ([place]) 09/03/2015  . Medicare annual wellness visit, subsequent 08/14/2015  . Acute pulmonary embolism ([place]) 07/25/2015  . Chest wall pain 07/15/2015  . History of pulmonary embolism 07/15/2015  . Chronic anticoagulation 05/22/2015  . Gait disturbance 05/15/2015  . DNR (do not resuscitate) 01/19/2015  . Hypotension 01/14/2015  . Paresthesia   . Depression 12/14/2014  . OSA (obstructive sleep apnea) 11/30/2014  . Chronic combined systolic and diastolic congestive heart failure ([place]) 11/11/2014  . SOB (shortness of breath) 04/17/2014  . Hemiplegia affecting left dominant side ([place]) 02/16/2014  . 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 06/30/2011    [name], PT, DPT 07/14/19    4:59 PM    [facility] [address] [place], [place], [address] Phone: [phone]   Fax:  [phone]  Name: Gregory Wiley MRN: [id number] Date of Birth: 1950/10/16

## 2019-07-14 NOTE — Progress Notes (Signed)
[facility]  PATIENT: Gregory Wiley DOB: [birth year]  REFERRING CLINICIAN: Tamsen Roers HISTORY FROM: patient   HISTORICAL  CHIEF COMPLAINT:  Chief Complaint  Patient presents with  . Follow-up    RM 12 with wife. Last seen 01/12/2019. Has hx CVA 2015.   Marland Kitchen Spasms    Takes tizanidine  . Numbness    Takes gabapentin  . Pain    takes vicodin  . CPAP    Takes armodafinil  . Gait Problem    Uses WC/quad cane. Had recent fall and hurt his neck. Had xray neck that was negative. Had CT brain and wants to review this today.     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   He feels he is stable for the most part.    Update 07/14/2019: He has chronic left sided hemiplegia since the stroke in 2015.  With a cane he can walk short distances.  He is actively participating in physical therapy.  His current AFO is more than 70 years old and is insufficient for his current ambulatory needs.   He will need to have a custom molded AFO to optimize the use of the left leg.  He has no new stroke symptoms.   He fell last month.  He did not lose consciousness.    He has some lightheadedness but no syncope.    He was seeing people or things in the house and outside last month and a head CT was done.   It did not show any acute findings (old atrophy and old R MCA stroke).   He sees things more in the left visual field than right.   At times he is more sparse in his responses but speech is ok.    Short term memory is reduced but about the same as last year.     He has OSA and is on CPAP.  In with CPAP he still has some lethargy and sleepiness.  In the past we tried Ritalin and Nuvigil at different times but there was no benefit.   He has sleep maintenance insomnia.    Doxepin and gabapentin have not helped  Update 01/12/2019: He is noting more lightheadedness.    He has no syncope.    He notes it with standing or sitting.   It occurs daily and is persistent.  He notes it more later in the day.   It occurs more intensely while standing but also occurs while sitting.    He sometimes feels nausea with a flushing sensation.   He stopped the Crestor for a few weeks to see if symptoms improved but they did not.  He is on midodrine 2.5 mg po bid and does not think it helps much.    His BP is often low but was high before the stroke.      He continues to have difficulty with his gait due to the left-sided weakness since the stroke.  He can go short distances with a cane.  He is noting the left foot bounces when he tries to walk  He has OSA and is on CPAP.  He still has lethargy and sleepiness.   Ritalin and Nuvigil at different times have not helped.     Update 1.23/2020: He denies any new symptoms since the last visit.   He felt funny last week feeling unfocused and was concerned he might have a stroke but everything  was back to baseline after resting.   His wife felt he looked pale.  He notes the left pinky was twitching for a few minutes but no other symptoms.    He is on Eliquis and notes easy bruising.   He has had a slow drop in the hemoglobin and his doctors may refer for coloscopy.    He can walk 60-100 feet with a cene and has no recent falls.    His mouth is dry.   He feels his bladder is ok.     He has rare fecal incontinence if he takes senokot for constipation.     He is very apathetic and notes some depression.   He is on sertraline..    He has a short temper.   He has OSA and is on CPAP.    He has not been on Nuvigil.   He was on Provigil shortly after the stroke but it had not helped much.      Update 08/06/2017: He denies new stroke like symptoms but notes swallowing is harder.    He needs to wash everything down or it will get stuck.    His mouth is often dry.     He is on Eliquis and notes he bruises easily, especially when a puppy jumps on him.     From the stroke, he has left sided weakness and spasticity    Tizanidine is helping the spasticity.  He is walking a little bit better and now can go about 80 feet using a cane before stopping.       Mood is much better and we discussed stopping the zoloft  He uses CPAP nightly and has a new machine.     Download shows great AHI at 2.5 and 100% compliance and average usage of 7 hours.   He notes some mental fogginess at times but this is no worse.   He fatigues easily.      He notes some neck pain but is doing better with a new pillow.  He does not need a TPI.        Update 03/06/2017:    He denies any new neurologic symptoms.    He has spastic left hemiplegia and left foot drop.  He uses his cane around the house.  He can go 30 feet without too much of a problem A brace is heavy and he would rather walk without it.    He had one fall last month.   His foot is not flat when he stands up on the floor.   His neck is hurting more.  Pain stays in his neck with no radiation into the arm.    He feels left spasticity is about the same.    At times, the spasticity can be painful in the leg.  Cognition is stable. His left sided neglect improved over time.   He is on Eliquis as anticoagulation.  He has OSA and has a new machine and mask.   He showed me an app and he had 3.3 - 8.1 AHI over the past week.         He notes neck pain, right greater than left. He does not want trigger point injection. In the past, TPI's have helped but he finds them to be painful.  From 09/16/2016: Stroke:   He denies any new stroke symptoms. He remains on Elavil risk for stroke prophylaxis. He has not had any transient ischemic attacks or other transient neurologic symptoms.  He continues to have left hemiplegia and he gets slurred speech when he is tired.   Stroke History:  On 02/12/2014, he woke up with weakness on the left side and was unable to stand up. He went to the emergency room.Marland Kitchen MRI of the brain showed an acute right MCA territory stroke that mostly involved the right temporal  lobe with extension to the insula and the basal ganglia on the right. MR angiogram showed an occlusion of the right M1 segment  Of note, he had been on aspirin and Plavix that had been discontinued a few weeks earlier due to a bleeding ulcer.   Ultrasound of the carotid arteries showed less than 39% stenosis. A transesophageal echocardiogram showed a LVEF equals 45%, there was septal hypokinesia, no PFO or ASD was identified.  Cardioembolism was suspected.  He was initially placed on Plavix but that was subsequently changed to Xarelto after he was found to have pulmonary embolisms while at rehabilitation center.  Gait/Strength/sensation:  Due to left hemiplegia, his gait is poor. He has had a few falls. With a quad cane he can walk about 30-50 feet if he is not tired and about 20-30 feet if he is tired. His bathroom is 30 feet away.  In the past, he was having orthostatic hypotension and had some episodes of syncope.    His leg also has left sided spasticity.   He also has numbness on the left side with some pain. He had some neglect and numbness initially that improved.   He has been on baclofen in the past but felt no benefit and felt very sleepy.    Orthostatic hypotension:     He is on Florinef and midodrine.  He gets pre-syncope but no recent syncope.     Neck pain/Headache:  He continues to report a neck pain, better than last year but still daily.   In the past, occiptial nerve blocks helped to reduce the severe headache and neck pain.    Bladder:  He has urinary frequency and urgency this year.   He has not had incontinence.   Flomax was started but he flet dizzy and stopped.    OSA/sleep/sleepiness:    In the past, he had obstructive sleep apnea but stopped CPAP as he did not like the mask. However, he has lost > 150 pounds since the stroke.   He still snores but wife has not noted OSA.      He has excessive daytime sleepiness.  Methylphenidate 20 mg never really helped.     In the past, EDS was  better when he wore CPAP but he can't wear he whole night.      Insomnia:   He has sleep maintenance insomnia > sleep onet (usually quick).    He takes melatonin at bedtime.   Cognitive:   He and his wife note some decreased attention and decrease executive function abilities.  He had a neglect after the stroke which appears to improve.  Mood:    He continues to feel depressed. He thinks this certainly may have helped slightly but not much. He gets irritable very easily. He denies anxiety.  REVIEW OF SYSTEMS:  Constitutional: No fevers, chills, sweats, or change in appetite.  He sleeps poorly.    He has daytime sleepiness.   Eyes: No visual changes, double vision, eye pain Ear, nose and throat: No hearing loss, ear pain, nasal congestion, sore throat Cardiovascular: No chest pain, palpitations Respiratory:  No shortness of breath at  rest or with exertion.   No wheezes.  He has OSA and is on CPAP GastrointestinaI: No nausea, vomiting, diarrhea, abdominal pain, fecal incontinence Genitourinary:  No dysuria, urinary retention or frequency.  No nocturia. Musculoskeletal:  No neck pain, back pain but he has shoulder and hip pain Integumentary: No rash, pruritus, skin lesions Neurological: as above Psychiatric: No depression at this time.  No anxiety Endocrine: No palpitations, diaphoresis, change in appetite, change in weigh.  He notes feeling cold and increased thirst Hematologic/Lymphatic:  No anemia, purpura, petechiae. Allergic/Immunologic: No itchy/runny eyes, nasal congestion, recent allergic reactions, rashes  ALLERGIES: Allergies  Allergen Reactions  . Diphenhydramine Hcl Anaphylaxis  . Adhesive [Tape] Other (See Comments)    Tears skin - please use paper tape  . Proscar [Finasteride] Other (See Comments)    Lightheaded.   . Tamsulosin Other (See Comments)    Dizzy, vomiting.     HOME MEDICATIONS: Outpatient Medications Prior to Visit  Medication Sig Dispense Refill  .  acetaminophen (TYLENOL) 325 MG tablet Take 1 tablet (325 mg total) by mouth every 6 (six) hours as needed for mild pain.    Marland Kitchen albuterol (PROVENTIL HFA;VENTOLIN HFA) 108 (90 Base) MCG/ACT inhaler Inhale 2 puffs into the lungs every 6 (six) hours as needed for wheezing or shortness of breath. 1 Inhaler 0  . apixaban (ELIQUIS) 5 MG TABS tablet Take 1 tablet (5 mg total) by mouth 2 (two) times daily. 180 tablet 2  . Armodafinil 200 MG TABS TAKE 1 TABLET BY MOUTH EVERY MORNING 30 tablet 5  . cholecalciferol (VITAMIN D) 1000 units tablet Take 1,000 Units by mouth daily.    . [facility] IRON 325 (65 Fe) MG tablet TAKE 1 TABLET BY MOUTH TWICE A DAY WITH A MEAL 180 tablet 0  . doxepin (SINEQUAN) 10 MG capsule TAKE 1 CAPSULE AT BEDTIME 90 capsule 1  . ELDERBERRY PO Take by mouth. 3 a day    . fluticasone (FLONASE) 50 MCG/ACT nasal spray Place 2 sprays into both nostrils daily. (Patient taking differently: Place 2 sprays into both nostrils daily as needed. )    . gabapentin (NEURONTIN) 300 MG capsule Take 1 capsule (300 mg total) by mouth at bedtime. 30 capsule 1  . HYDROcodone-acetaminophen (NORCO/VICODIN) 5-325 MG tablet Take 1 tablet by mouth daily as needed for moderate pain. 30 tablet 0  . hydrocortisone (ANUSOL-HC) 25 MG suppository Place 1 suppository (25 mg total) rectally 2 (two) times daily as needed. X 1 week; then, use as needed thereafter. 24 suppository 3  . KLOR-CON M10 10 MEQ tablet TAKE 1 TABLETS BY MOUTH 2 TIMES DAILY.    Marland Kitchen lansoprazole (PREVACID) 30 MG capsule Take 1 capsule (30 mg total) by mouth 2 (two) times daily before a meal. 180 capsule 1  . Melatonin 10 MG CAPS TAKE 1 CAPSULE BY MOUTH AT BEDTIME AS NEEDED FOR SLEEP 90 capsule 3  . midodrine (PROAMATINE) 2.5 MG tablet Take 1 tablet (2.5 mg total) by mouth 2 (two) times daily with a meal. 180 tablet 3  . nitroGLYCERIN (NITROSTAT) 0.4 MG SL tablet Place 1 tablet (0.4 mg total) under the tongue every 5 (five) minutes as needed for chest pain.      . NONFORMULARY OR COMPOUNDED ITEM Supplement menmoringa oleifera taken daily.    Marland Kitchen nystatin (MYCOSTATIN/NYSTOP) powder Apply topically 2 (two) times daily. 60 g 3  . OVER THE COUNTER MEDICATION Ceylon Cinnamon- takes 2 a day, 1200 mg    . potassium chloride (K-DUR)  10 MEQ tablet TAKE 1 TABLETS BY MOUTH 2 TIMES DAILY. 360 tablet 3  . predniSONE (DELTASONE) 20 MG tablet Take 1 tablet (20 mg total) by mouth daily. 5 tablet 0  . PRESCRIPTION MEDICATION Inhale into the lungs at bedtime. CPAP    . prochlorperazine (COMPAZINE) 10 MG tablet Take 1 tablet (10 mg total) by mouth every 8 (eight) hours as needed (headache, nausea or vomiting). 10 tablet 0  . QC LO-DOSE ASPIRIN 81 MG EC tablet TAKE 1 TABLET (81 MG TOTAL) BY MOUTH DAILY. SWALLOW WHOLE. 90 tablet 3  . QC STOOL SOFTENER PLS LAXATIVE 8.6-50 MG tablet TAKE 1 TABLET BY MOUTH 2 TIMES DAILY. (Patient taking differently: Take 1 tablet by mouth as needed. ) 60 tablet 2  . rosuvastatin (CRESTOR) 40 MG tablet Take 1 tablet (40 mg total) by mouth daily.    Marland Kitchen senna (SENOKOT) 8.6 MG tablet Take 1 tablet by mouth 2 (two) times daily.    . sertraline (ZOLOFT) 100 MG tablet TAKE 1 TABLET BY MOUTH EVERY DAY 90 tablet 3  . tiZANidine (ZANAFLEX) 4 MG capsule Take 1 capsule (4 mg total) by mouth as needed. 90 capsule 3  . pyridOXINE (B-6) 50 MG tablet Take by mouth.     No facility-administered medications prior to visit.    PAST MEDICAL HISTORY: Past Medical History:  Diagnosis Date  . Adrenal insufficiency ([place])   . Anxiety   . Basal cell carcinoma of skin of left ankle   . Bleeding stomach ulcer 2015  . Coronary artery disease    stent, then CABG 07/20/10  . Daily headache    "for the past month" (11/11/2014)  . Depression   . Diverticulosis   . GERD (gastroesophageal reflux disease)   . Gout   . H/O cardiomyopathy    ischemic, now last echo 07/30/11, EF 38%W  . History of hiatal hernia   . Hyperlipidemia   . Hypertension   . Internal  hemorrhoids   . Left hemiplegia ([place])   . NSTEMI (non-ST elevated myocardial infarction) ([place])    NSTEMI- last cath 06/2011-Stent to LCX-DES, last nuc 07/30/11 low risk  . Obesity (BMI 30-39.9)   . OSA on CPAP    since July 2013- uses CPAP sometimes , states he has lost 147 lbs. since stroke & doesn't use the CPAP as much as he use to.   . Plantar fasciitis of left foot   . Pneumonia    hosp. 12/2014  . Pulmonary embolism ([place]) 03/2014  . Stroke ([place]) 01/2014   "no control of LUE, can slightly LLE; memory problems since" (11/11/2014)  . Tubular adenoma of colon     PAST SURGICAL HISTORY: Past Surgical History:  Procedure Laterality Date  . BASAL CELL CARCINOMA EXCISION Left 2015   ankle  . CARDIAC SURGERY    . CATARACT EXTRACTION Bilateral   . COLONOSCOPY N/A 02/10/2013   Procedure: COLONOSCOPY;  Surgeon: Ladene Artist, MD;  Location: [facility];  Service: Endoscopy;  Laterality: N/A;  . CORONARY ANGIOPLASTY WITH STENT PLACEMENT  07/01/2011   DES-Resolute to native LCX  . CORONARY ANGIOPLASTY WITH STENT PLACEMENT  12/01/2007   COMPLEX 5 LESION PCI INCLUDING CUTTING BALLOON AND 4 CYPHER DESs  . CORONARY ARTERY BYPASS GRAFT  07/20/2010    LIMA-LAD; VG-ACUTE MARG of RCA; Seq VG-distal RCA & then pda  . EP IMPLANTABLE DEVICE N/A 02/14/2016   Procedure: Loop Recorder Removal;  Surgeon: Thompson Grayer, MD;  Location: [facility];  Service: Cardiovascular;  Laterality: N/A;  . ESOPHAGOGASTRODUODENOSCOPY  01/2014   gastric ulcer, erosive gastroduodenitis, H Pylori negative.   . ESOPHAGOGASTRODUODENOSCOPY (EGD) WITH PROPOFOL N/A 11/14/2014   Procedure: ESOPHAGOGASTRODUODENOSCOPY (EGD) WITH PROPOFOL;  Surgeon: Milus Banister, MD;  Location: [facility];  Service: Endoscopy;  Laterality: N/A;  . HAND SURGERY Right    to take glass out  . HERNIA REPAIR    . INTRAMEDULLARY (IM) NAIL INTERTROCHANTERIC Right 02/07/2015   Procedure: INTRAMEDULLARY (IM) NAIL INTERTROCHANTRIC RIGHT HIP;   Surgeon: Renette Butters, MD;  Location: [facility];  Service: Orthopedics;  Laterality: Right;  . KNEE ARTHROSCOPY Right   . LEFT HEART CATHETERIZATION WITH CORONARY/GRAFT ANGIOGRAM  07/01/2011   Procedure: LEFT HEART CATHETERIZATION WITH Beatrix Fetters;  Surgeon: Sanda Klein, MD;  Location: [facility];  Service: Cardiovascular;;  . LOOP RECORDER IMPLANT  02/15/2014   MDT LINQ implanted by Dr Rayann Heman for cryptogenic stroke  . PERCUTANEOUS CORONARY STENT INTERVENTION (PCI-S) Right 07/01/2011   Procedure: PERCUTANEOUS CORONARY STENT INTERVENTION (PCI-S);  Surgeon: Sanda Klein, MD;  Location: [facility];  Service: Cardiovascular;  Laterality: Right;  . PICC LINE PLACE PERIPHERAL ([facility] HX)  06/2015  . RETINAL DETACHMENT SURGERY Right   . TEE WITHOUT CARDIOVERSION N/A 02/15/2014   Procedure: TRANSESOPHAGEAL ECHOCARDIOGRAM (TEE);  Surgeon: Josue Hector, MD;  Location: [facility];  Service: Cardiovascular;  Laterality: N/A;  . UMBILICAL HERNIA REPAIR  2006    FAMILY HISTORY: Family History  Problem Relation Age of Onset  . Diabetes type II Mother   . CAD Father 81       CABG, PCI, died at 29  . Heart attack Father 21  . Hypertension Brother   . Diabetes Brother   . Hyperlipidemia Brother   . Brain cancer Maternal Grandmother   . COPD Paternal Grandfather   . Coronary artery disease Other   . Other Other        denies family history of DVT/PE  . Colon cancer Neg Hx   . Stroke Neg Hx   . Prostate cancer Neg Hx     SOCIAL HISTORY:  Social History   Socioeconomic History  . Marital status: Married    Spouse name: Not on file  . Number of children: 5  . Years of education: Not on file  . Highest education level: Not on file  Occupational History    Employer: [employer]    Comment: works at the [employer]  . Smoking status: Former Smoker    Packs/day: 2.00    Years: 4.00    Pack years: 8.00    Types: Cigarettes    Quit date: 01/30/1969    Years  since quitting: 50.4  . Smokeless tobacco: Never Used  . Tobacco comment: "quit smoking cigarettes  in the 1970's"  Substance and Sexual Activity  . Alcohol use: No    Alcohol/week: 0.0 standard drinks  . Drug use: No    Comment: "smoked some pot in college"  . Sexual activity: Not Currently  Other Topics Concern  . Not on file  Social History Narrative   Quit smoking 40 years ago   Married 1996   Likes to play guitar and sing with church group unable since 02/12/14 stroke    [employer] 1970-1972, [place], no service related issues.     Left handed but now right handed after stroke   Social Determinants of Health   Financial Resource Strain:   . Difficulty of Paying Living Expenses:  Food Insecurity:   . Worried About Charity fundraiser in the Last Year:   . Arboriculturist in the Last Year:   Transportation Needs:   . Film/video editor (Medical):   Marland Kitchen Lack of Transportation (Non-Medical):   Physical Activity:   . Days of Exercise per Week:   . Minutes of Exercise per Session:   Stress:   . Feeling of Stress :   Social Connections:   . Frequency of Communication with Friends and Family:   . Frequency of Social Gatherings with Friends and Family:   . Attends Religious Services:   . Active Member of Clubs or Organizations:   . Attends Archivist Meetings:   Marland Kitchen Marital Status:   Intimate Partner Violence:   . Fear of Current or Ex-Partner:   . Emotionally Abused:   Marland Kitchen Physically Abused:   . Sexually Abused:      PHYSICAL EXAM  Vitals:   07/14/19 1417  BP: 118/70  Pulse: 72  Temp: 99.6 F (37.6 C)  Weight: 258 lb 8 oz (117.3 kg)  Height: 6' (1.829 m)    Body mass index is 35.06 kg/m.    General: The patient is well-developed and well-nourished and in no acute distress.  He is in a wheelchair.  Neck:  There are no carotid bruits. The neck is mildly tender in the lower cervical paraspinal region and the trapezius muscles..     Skin: He has mild  edema at the left ankle.  Neurologic Exam  Mental status: The patient is alert and oriented x 3 at the time of the examination. The patient has apparent normal recent and remote memory. Attention is mildly reduced.   Speech is normal.   There is no neglect.  Cranial nerves: Extraocular movements are full.  Facial strength and sensation is normal.  Trapezius strength is normal.. No dysarthria is noted.  No obvious hearing deficits are noted.  Motor:  Muscle bulk is normal.  He has increased muscle tone in the left arm and leg.   strength is 5/5 on the right. Strength is 2/5 in left shoulder elevation and 1/5 in proximal arm strength.  Strength is 2/5 in the proximal left leg and 3/5 in the quadriceps and 2/5 in the distal leg  Sensory: He has intact sensation to touch and vibration  Coordination:  Finger-nose-finger and heel-to-shin is performed well on the right.  He is unable to do these on the left.    Gait and station: He needs support to stand and to take any steps  Reflexes: Deep tendon reflexes are increased on the left.Marland Kitchen      DIAGNOSTIC DATA (LABS, IMAGING, TESTING) - I reviewed patient records, labs, notes, testing and imaging myself where available.  Lab Results  Component Value Date   WBC 7.0 06/17/2019   HGB 15.1 06/17/2019   HCT 46.5 06/17/2019   MCV 90.3 06/17/2019   PLT 144 06/17/2019      Component Value Date/Time   NA 142 06/17/2019 1445   NA 143 06/02/2018 0000   K 4.8 06/17/2019 1445   CL 106 06/17/2019 1445   CO2 28 06/17/2019 1445   GLUCOSE 77 06/17/2019 1445   BUN 17 06/17/2019 1445   BUN 17 06/02/2018 0000   CREATININE 1.03 06/17/2019 1445   CALCIUM 9.2 06/17/2019 1445   PROT 7.0 06/17/2019 1445   PROT 6.6 06/02/2018 0000   ALBUMIN 4.0 12/14/2018 1446   ALBUMIN 3.9 06/02/2018 0000  AST 20 06/17/2019 1445   ALT 16 06/17/2019 1445   ALKPHOS 65 12/14/2018 1446   BILITOT 0.5 06/17/2019 1445   BILITOT 0.3 06/02/2018 0000   GFRNONAA 77 06/02/2018  0000   GFRNONAA >89 09/13/2014 1032   GFRAA 89 06/02/2018 0000   GFRAA >89 09/13/2014 1032   Lab Results  Component Value Date   CHOL 134 06/17/2019   HDL 46 06/17/2019   LDLCALC 72 06/17/2019   TRIG 82 06/17/2019   CHOLHDL 2.9 06/17/2019   Lab Results  Component Value Date   HGBA1C 5.6 02/13/2014   Lab Results  Component Value Date   VITAMINB12 286 07/05/2015   Lab Results  Component Value Date   TSH 1.640 06/02/2018        ASSESSMENT AND PLAN  Hemiplegia affecting left side in left-dominant patient as late effect of cerebrovascular disease (HCC)  Paresthesia  Left foot drop  OSA (obstructive sleep apnea)  Chronic anticoagulation  Excessive daytime sleepiness  Gait disturbance    1.    Continue Eliquis for anticoagulation.    2.    Use 4 prong cane around the house and wheelchair for longer distance.     Script for Custom molded left AFO with PT 3.    Continue midodrine 2.5 mg 2-3 times a day.  consider changing to pyridostigmine.  4.    Continue regular use of CPAP.  Continue Nuvigil for sleepiness,   Take gabapentin with doxepin to help with insomnia.   5.    He will return to see me in 5-6 months or sooner if he has new or worsening neurologic symptoms.   Deniece Rankin A. Felecia Shelling, MD, PhD [id number], [id number] PM Certified in Neurology, Clinical Neurophysiology, Sleep Medicine, Pain Medicine and Neuroimaging  [facility] [address] [place], [place] [address] [phone]

## 2019-07-15 ENCOUNTER — Ambulatory Visit: Payer: Medicare Other | Admitting: Physical Therapy

## 2019-07-15 ENCOUNTER — Other Ambulatory Visit: Payer: Self-pay | Admitting: Neurology

## 2019-07-20 ENCOUNTER — Encounter: Payer: Self-pay | Admitting: Physical Therapy

## 2019-07-20 ENCOUNTER — Ambulatory Visit: Payer: Medicare Other | Attending: Neurology | Admitting: Physical Therapy

## 2019-07-20 ENCOUNTER — Other Ambulatory Visit: Payer: Self-pay

## 2019-07-20 VITALS — BP 140/86

## 2019-07-20 DIAGNOSIS — R296 Repeated falls: Secondary | ICD-10-CM | POA: Insufficient documentation

## 2019-07-20 DIAGNOSIS — I69354 Hemiplegia and hemiparesis following cerebral infarction affecting left non-dominant side: Secondary | ICD-10-CM | POA: Diagnosis present

## 2019-07-20 DIAGNOSIS — R293 Abnormal posture: Secondary | ICD-10-CM | POA: Diagnosis present

## 2019-07-20 DIAGNOSIS — R2689 Other abnormalities of gait and mobility: Secondary | ICD-10-CM | POA: Diagnosis present

## 2019-07-20 DIAGNOSIS — M25672 Stiffness of left ankle, not elsewhere classified: Secondary | ICD-10-CM | POA: Diagnosis present

## 2019-07-20 DIAGNOSIS — R2681 Unsteadiness on feet: Secondary | ICD-10-CM | POA: Insufficient documentation

## 2019-07-20 DIAGNOSIS — M21372 Foot drop, left foot: Secondary | ICD-10-CM | POA: Diagnosis present

## 2019-07-20 DIAGNOSIS — M6281 Muscle weakness (generalized): Secondary | ICD-10-CM

## 2019-07-21 NOTE — Therapy (Signed)
[facility] [address] [place], [place], [address] Phone: [phone]   Fax:  [phone]  Physical Therapy Treatment  Patient Details  Name: Gregory Wiley MRN: [id number] Date of Birth: Oct 29, 1950 Referring Provider (PT): Felecia Shelling, Nanine Means, MD   Encounter Date: 07/20/2019  PT End of Session - 07/20/19 1630    Visit Number  14    Number of Visits  34    Date for PT Re-Evaluation  10/04/19    Authorization Type  [employer] Medicare - 10th visit PN    Progress Note Due on Visit  20    PT Start Time  1318    PT Stop Time  1400    PT Time Calculation (min)  42 min    Equipment Utilized During Treatment  Gait belt;Other (comment)   Bioness   Activity Tolerance  Patient tolerated treatment well    Behavior During Therapy  WFL for tasks assessed/performed       Past Medical History:  Diagnosis Date  . Adrenal insufficiency ([place])   . Anxiety   . Basal cell carcinoma of skin of left ankle   . Bleeding stomach ulcer 2015  . Coronary artery disease    stent, then CABG 07/20/10  . Daily headache    "for the past month" (11/11/2014)  . Depression   . Diverticulosis   . GERD (gastroesophageal reflux disease)   . Gout   . H/O cardiomyopathy    ischemic, now last echo 07/30/11, EF 38%W  . History of hiatal hernia   . Hyperlipidemia   . Hypertension   . Internal hemorrhoids   . Left hemiplegia ([place])   . NSTEMI (non-ST elevated myocardial infarction) ([place])    NSTEMI- last cath 06/2011-Stent to LCX-DES, last nuc 07/30/11 low risk  . Obesity (BMI 30-39.9)   . OSA on CPAP    since July 2013- uses CPAP sometimes , states he has lost 147 lbs. since stroke & doesn't use the CPAP as much as he use to.   . Plantar fasciitis of left foot   . Pneumonia    hosp. 12/2014  . Pulmonary embolism ([place]) 03/2014  . Stroke ([place]) 01/2014   "no control of LUE, can slightly LLE; memory problems since" (11/11/2014)  . Tubular adenoma of colon      Past Surgical History:  Procedure Laterality Date  . BASAL CELL CARCINOMA EXCISION Left 2015   ankle  . CARDIAC SURGERY    . CATARACT EXTRACTION Bilateral   . COLONOSCOPY N/A 02/10/2013   Procedure: COLONOSCOPY;  Surgeon: Ladene Artist, MD;  Location: [facility];  Service: Endoscopy;  Laterality: N/A;  . CORONARY ANGIOPLASTY WITH STENT PLACEMENT  07/01/2011   DES-Resolute to native LCX  . CORONARY ANGIOPLASTY WITH STENT PLACEMENT  12/01/2007   COMPLEX 5 LESION PCI INCLUDING CUTTING BALLOON AND 4 CYPHER DESs  . CORONARY ARTERY BYPASS GRAFT  07/20/2010    LIMA-LAD; VG-ACUTE MARG of RCA; Seq VG-distal RCA & then pda  . EP IMPLANTABLE DEVICE N/A 02/14/2016   Procedure: Loop Recorder Removal;  Surgeon: Thompson Grayer, MD;  Location: [facility];  Service: Cardiovascular;  Laterality: N/A;  . ESOPHAGOGASTRODUODENOSCOPY  01/2014   gastric ulcer, erosive gastroduodenitis, H Pylori negative.   . ESOPHAGOGASTRODUODENOSCOPY (EGD) WITH PROPOFOL N/A 11/14/2014   Procedure: ESOPHAGOGASTRODUODENOSCOPY (EGD) WITH PROPOFOL;  Surgeon: Milus Banister, MD;  Location: [facility];  Service: Endoscopy;  Laterality: N/A;  . HAND SURGERY Right    to  take glass out  . HERNIA REPAIR    . INTRAMEDULLARY (IM) NAIL INTERTROCHANTERIC Right 02/07/2015   Procedure: INTRAMEDULLARY (IM) NAIL INTERTROCHANTRIC RIGHT HIP;  Surgeon: Renette Butters, MD;  Location: [facility];  Service: Orthopedics;  Laterality: Right;  . KNEE ARTHROSCOPY Right   . LEFT HEART CATHETERIZATION WITH CORONARY/GRAFT ANGIOGRAM  07/01/2011   Procedure: LEFT HEART CATHETERIZATION WITH Beatrix Fetters;  Surgeon: Sanda Klein, MD;  Location: [facility];  Service: Cardiovascular;;  . LOOP RECORDER IMPLANT  02/15/2014   MDT LINQ implanted by Dr Rayann Heman for cryptogenic stroke  . PERCUTANEOUS CORONARY STENT INTERVENTION (PCI-S) Right 07/01/2011   Procedure: PERCUTANEOUS CORONARY STENT INTERVENTION (PCI-S);  Surgeon: Sanda Klein, MD;   Location: [facility] C[facility];  Service: Cardiovascular;  Laterality: Right;  . PICC LINE PLACE PERIPHERAL ([place] HX)  06/2015  . RETINAL DETACHMENT SURGERY Right   . TEE WITHOUT CARDIOVERSION N/A 02/15/2014   Procedure: TRANSESOPHAGEAL ECHOCARDIOGRAM (TEE);  Surgeon: Josue Hector, MD;  Location: [facility];  Service: Cardiovascular;  Laterality: N/A;  . UMBILICAL HERNIA REPAIR  2006    Vitals:   07/20/19 1319  BP: 140/86    Subjective Assessment - 07/20/19 1319    Subjective  No falls or pain to report.    Patient is accompained by:  Family member   spouse   Pertinent History  DNR; anxiety & depression, CAD with CABG, diverticulosis, gout, HLD, HTN, left hemiplegia, NSTEMI (2013), morbid obesity, PE (2016), CVA (2015), tubular adenoma of colon, OSA w/CPAP, right hip fx w/ORIF (2016), iron deficiency anemia    Limitations  Standing;Walking    Pain Score  0-No pain            [facility] Adult PT Treatment/Exercise - 07/20/19 1320      Transfers   Transfers  Sit to Stand;Stand to Sit    Sit to Stand  4: Min guard;With upper extremity assist;From chair/3-in-1;With armrests    Sit to Stand Details (indicate cue type and reason)  to stand from wheelchair x 3 with session.     Stand to Sit  4: Min guard;With upper extremity assist;With armrests;To chair/3-in-1    Stand to Sit Details  cues to use UE to controll descent to wheelchair after gait.       Ambulation/Gait   Ambulation/Gait  Yes    Ambulation/Gait Assistance  4: Min assist    Ambulation/Gait Assistance Details  with pt's quad cane- assist with cues needed for left LE step placement, cues for increased right step length. used Bioness to left LE with gait with good knee control and foot clearance noted this session.  pt's left LE scissoring when not assisted by PTA with gait.     Ambulation Distance (Feet)  15 Feet    Assistive device  Small based quad cane    Gait Pattern  Step-to pattern;Step-through pattern;Decreased step length -  left;Decreased stance time - left;Decreased hip/knee flexion - left;Decreased dorsiflexion - left;Poor foot clearance - left;Scissoring    Ambulation Surface  Level;Indoor      Exercises   Exercises  Other Exercises    Other Exercises   concurrent with Bioness to left LE: with left foot on balance board in ant/post direction working on active assist DF with on time, rest with off times for 20 reps; in standing with right UE support- mini squats with on time, rest with off times for 8 reps before pt needed to sit down due to fatigue.  Acupuncturist Location  L anterior tibialis and L quad muscles    Electrical Stimulation Action  working on increased activation for strengthening with ex's and gait    Electrical Stimulation Parameters  refer to tablet 2 for adjusted parameters; round cloth electrodes    Electrical Stimulation Goals  Strength;Tone;Edema;Neuromuscular facilitation;Other (comment)            PT Short Term Goals - 07/06/19 1523      PT SHORT TERM GOAL #1   Title  Patient will be able to perform 2-3 exercises in standing for HEP  (RESET STG AFTER 30 DAYS)    Time  4    Period  Weeks    Status  Revised    Target Date  08/05/19      PT SHORT TERM GOAL #2   Title  Pt will be able to perform full TUG with cane    Baseline  1/2 TUG    Time  4    Period  Weeks    Status  Revised    Target Date  08/05/19      PT SHORT TERM GOAL #3   Title  Pt tolerates use of Bioness unit for transfer and gait training with quad cane and min A    Time  4    Period  Weeks    Status  Revised    Target Date  08/05/19      PT SHORT TERM GOAL #4   Title  Pt will improve gait velocity (over 10 foot distance) to >/= .5 ft/sec with cane and new AFO    Baseline  .17 ft/sec, no AFO with cane    Time  4    Period  Weeks    Status  Revised    Target Date  08/05/19      PT SHORT TERM GOAL #5   Title  Pt will consistently perform stand pivot transfers  to L and R with cane and AFO with min A    Time  4    Period  Weeks    Status  Revised    Target Date  08/05/19        PT Long Term Goals - 07/06/19 1527      PT LONG TERM GOAL #1   Title  Patient and wife demonstrate and verbalizes independence with final HEP    Baseline  difficulty finding exercises for pt to do at home, sits and sleeps in recliner; no access to bed    Time  12    Period  Weeks    Status  Revised    Target Date  10/04/19      PT LONG TERM GOAL #2   Title  Pt will improve TUG with cane and Bioness/AFO to </= 60 seconds    Baseline  TBD for full TUG    Time  12    Period  Weeks    Status  Revised    Target Date  10/04/19      PT LONG TERM GOAL #3   Title  Patient will improve gait velocity over full distance with quad cane and most appropriate orthosis (bioness or AFO) to >0.8 ft/sec    Time  12    Period  Weeks    Status  Revised    Target Date  10/04/19      PT LONG TERM GOAL #4   Title  Patient demonstrates improved L passive ankle DF by  at least 10 degrees.    Baseline  lacking 24 deg with knee extended, lacking 10 deg with knee flexed > Lacking 20 deg with knee extended, lacking 16 deg with knee flexed    Time  12    Period  Weeks    Status  Revised    Target Date  10/04/19      PT LONG TERM GOAL #5   Title  Pt will demonstrate ability to transfer w/c <> mat/chair with quad cane, most appropriate orthosis and supervision    Baseline  min-mod A with quad cane    Time  12    Period  Weeks    Status  Revised    Target Date  10/04/19            Plan - 07/20/19 1630    Clinical Impression Statement  Continued with use of Bioness to left LE with ex's for strengthening and with gait. Decreased ankle DF response noted today with Bioness, will continue to adjust as needed. Have received order for AFO and pt has had face to face, will work on setting up appt with [facility] to come to one of his PT appts. The pt is progressing toward goals and should  benefit from continued PT to progress toward unmet goals.    Personal Factors and Comorbidities  Age;Fitness;Past/Current Experience;Comorbidity 3+;Time since onset of injury/illness/exacerbation    Comorbidities  anixety & depression, CAD, diverticulosis, gout, HLD, HTN, left hemiplegia, NSTEMI (2013), morbid obesity, PE (2016), CVA (2015), tubular adenoma of colon, OSA w/CPAP, right hip fx w/ORIF (2016), iron deficiency anemia    Examination-Activity Limitations  Squat;Stairs;Stand;Dressing;Transfers;Bed Mobility;Locomotion Level    Examination-Participation Restrictions  Church;Community Activity    Stability/Clinical Decision Making  Evolving/Moderate complexity    Rehab Potential  Good    PT Frequency  2x / week    PT Duration  12 weeks    PT Treatment/Interventions  ADLs/Self Care Home Management;Electrical Stimulation;DME Instruction;Balance training;Therapeutic exercise;Orthotic Fit/Training;Gait training;Neuromuscular re-education;Stair training;Functional mobility training;Patient/family education;Therapeutic activities;Manual techniques;Compression bandaging;Passive range of motion    PT Next Visit Plan  **DNR** assess vitals throughout session.  Bioness in training mode on Nustep for edema management and ROM?  Sit > stand training.  Stand pivots to L.  Gait with cane; endurance and standing tolerance.  Sidelying stretches and exercises with gravity minimized.    Consulted and Agree with Plan of Care  Patient;Family member/caregiver    Family Member Consulted  spouse       Patient will benefit from skilled therapeutic intervention in order to improve the following deficits and impairments:  Abnormal gait, Decreased range of motion, Difficulty walking, Impaired tone, Obesity, Impaired UE functional use, Decreased endurance, Decreased activity tolerance, Decreased skin integrity, Decreased balance, Decreased knowledge of use of DME, Impaired flexibility, Postural dysfunction, Impaired  sensation, Increased edema, Decreased strength, Decreased mobility  Visit Diagnosis: Repeated falls  Other abnormalities of gait and mobility  Muscle weakness (generalized)  Foot drop, left  Spastic hemiplegia of left nondominant side as late effect of cerebral infarction [facility])     Problem List Patient Active Problem List   Diagnosis Date Noted  . Left foot drop 07/14/2019  . GERD (gastroesophageal reflux disease) 06/18/2019  . Tinea cruris 07/20/2017  . Hemorrhoid 07/20/2017  . Irritability 06/08/2017  . Fall at home 05/06/2017  . Advance care planning 11/28/2016  . Skin mass 11/28/2016  . Lower urinary tract symptoms (LUTS) 09/05/2016  . Hearing loss 09/05/2016  . Excessive daytime sleepiness  04/16/2016  . Exposure to TB 02/21/2016  . Vitamin D deficiency 02/21/2016  . Anemia 11/10/2015  . Left thigh pain 11/10/2015  . Neck pain 10/12/2015  . Hemiplegia affecting left side in left-dominant patient as late effect of cerebrovascular disease ([place]) 09/03/2015  . Medicare annual wellness visit, subsequent 08/14/2015  . Acute pulmonary embolism ([place]) 07/25/2015  . Chest wall pain 07/15/2015  . History of pulmonary embolism 07/15/2015  . Chronic anticoagulation 05/22/2015  . Gait disturbance 05/15/2015  . DNR (do not resuscitate) 01/19/2015  . Hypotension 01/14/2015  . Paresthesia   . Depression 12/14/2014  . OSA (obstructive sleep apnea) 11/30/2014  . Chronic combined systolic and diastolic congestive heart failure ([place]) 11/11/2014  . SOB (shortness of breath) 04/17/2014  . Hemiplegia affecting left dominant side ([place]) 02/16/2014  . 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 06/30/2011    Willow Ora, PTA, [facility] [address] [place], [place] [address] [phone] 07/21/19, 11:40 AM   Name: Gregory Wiley MRN: [id number] Date of Birth: 12/31/1950

## 2019-07-23 ENCOUNTER — Other Ambulatory Visit: Payer: Self-pay

## 2019-07-23 ENCOUNTER — Encounter: Payer: Self-pay | Admitting: Physical Therapy

## 2019-07-23 ENCOUNTER — Ambulatory Visit: Payer: Medicare Other | Admitting: Physical Therapy

## 2019-07-23 DIAGNOSIS — M6281 Muscle weakness (generalized): Secondary | ICD-10-CM

## 2019-07-23 DIAGNOSIS — R2681 Unsteadiness on feet: Secondary | ICD-10-CM

## 2019-07-23 DIAGNOSIS — R296 Repeated falls: Secondary | ICD-10-CM | POA: Diagnosis not present

## 2019-07-23 DIAGNOSIS — M25672 Stiffness of left ankle, not elsewhere classified: Secondary | ICD-10-CM

## 2019-07-23 DIAGNOSIS — I69354 Hemiplegia and hemiparesis following cerebral infarction affecting left non-dominant side: Secondary | ICD-10-CM

## 2019-07-23 DIAGNOSIS — R293 Abnormal posture: Secondary | ICD-10-CM

## 2019-07-23 DIAGNOSIS — R2689 Other abnormalities of gait and mobility: Secondary | ICD-10-CM

## 2019-07-23 DIAGNOSIS — M21372 Foot drop, left foot: Secondary | ICD-10-CM

## 2019-07-23 NOTE — Therapy (Signed)
[facility] [address] [place], [place], [address] Phone: [phone]   Fax:  [phone]  Physical Therapy Treatment  Patient Details  Name: Gregory Wiley MRN: [id number] Date of Birth: Sep 27, 1950 Referring Provider (PT): Felecia Shelling, Nanine Means, MD   Encounter Date: 07/23/2019  PT End of Session - 07/23/19 1404    Visit Number  15    Number of Visits  34    Date for PT Re-Evaluation  10/04/19    Authorization Type  [employer] Medicare - 10th visit PN    Progress Note Due on Visit  20    PT Start Time  1402    PT Stop Time  1443    PT Time Calculation (min)  41 min    Equipment Utilized During Treatment  Gait belt;Other (comment)   Bioness   Activity Tolerance  Patient tolerated treatment well    Behavior During Therapy  WFL for tasks assessed/performed       Past Medical History:  Diagnosis Date  . Adrenal insufficiency ([place])   . Anxiety   . Basal cell carcinoma of skin of left ankle   . Bleeding stomach ulcer 2015  . Coronary artery disease    stent, then CABG 07/20/10  . Daily headache    "for the past month" (11/11/2014)  . Depression   . Diverticulosis   . GERD (gastroesophageal reflux disease)   . Gout   . H/O cardiomyopathy    ischemic, now last echo 07/30/11, EF 38%W  . History of hiatal hernia   . Hyperlipidemia   . Hypertension   . Internal hemorrhoids   . Left hemiplegia ([place])   . NSTEMI (non-ST elevated myocardial infarction) ([place])    NSTEMI- last cath 06/2011-Stent to LCX-DES, last nuc 07/30/11 low risk  . Obesity (BMI 30-39.9)   . OSA on CPAP    since July 2013- uses CPAP sometimes , states he has lost 147 lbs. since stroke & doesn't use the CPAP as much as he use to.   . Plantar fasciitis of left foot   . Pneumonia    hosp. 12/2014  . Pulmonary embolism ([place]) 03/2014  . Stroke ([place]) 01/2014   "no control of LUE, can slightly LLE; memory problems since" (11/11/2014)  . Tubular adenoma of colon      Past Surgical History:  Procedure Laterality Date  . BASAL CELL CARCINOMA EXCISION Left 2015   ankle  . CARDIAC SURGERY    . CATARACT EXTRACTION Bilateral   . COLONOSCOPY N/A 02/10/2013   Procedure: COLONOSCOPY;  Surgeon: Ladene Artist, MD;  Location: [facility];  Service: Endoscopy;  Laterality: N/A;  . CORONARY ANGIOPLASTY WITH STENT PLACEMENT  07/01/2011   DES-Resolute to native LCX  . CORONARY ANGIOPLASTY WITH STENT PLACEMENT  12/01/2007   COMPLEX 5 LESION PCI INCLUDING CUTTING BALLOON AND 4 CYPHER DESs  . CORONARY ARTERY BYPASS GRAFT  07/20/2010    LIMA-LAD; VG-ACUTE MARG of RCA; Seq VG-distal RCA & then pda  . EP IMPLANTABLE DEVICE N/A 02/14/2016   Procedure: Loop Recorder Removal;  Surgeon: Thompson Grayer, MD;  Location: [facility];  Service: Cardiovascular;  Laterality: N/A;  . ESOPHAGOGASTRODUODENOSCOPY  01/2014   gastric ulcer, erosive gastroduodenitis, H Pylori negative.   . ESOPHAGOGASTRODUODENOSCOPY (EGD) WITH PROPOFOL N/A 11/14/2014   Procedure: ESOPHAGOGASTRODUODENOSCOPY (EGD) WITH PROPOFOL;  Surgeon: Milus Banister, MD;  Location: [facility];  Service: Endoscopy;  Laterality: N/A;  . HAND SURGERY Right    to  take glass out  . HERNIA REPAIR    . INTRAMEDULLARY (IM) NAIL INTERTROCHANTERIC Right 02/07/2015   Procedure: INTRAMEDULLARY (IM) NAIL INTERTROCHANTRIC RIGHT HIP;  Surgeon: Renette Butters, MD;  Location: [facility];  Service: Orthopedics;  Laterality: Right;  . KNEE ARTHROSCOPY Right   . LEFT HEART CATHETERIZATION WITH CORONARY/GRAFT ANGIOGRAM  07/01/2011   Procedure: LEFT HEART CATHETERIZATION WITH Beatrix Fetters;  Surgeon: Sanda Klein, MD;  Location: [facility];  Service: Cardiovascular;;  . LOOP RECORDER IMPLANT  02/15/2014   MDT LINQ implanted by Dr Rayann Heman for cryptogenic stroke  . PERCUTANEOUS CORONARY STENT INTERVENTION (PCI-S) Right 07/01/2011   Procedure: PERCUTANEOUS CORONARY STENT INTERVENTION (PCI-S);  Surgeon: Sanda Klein, MD;   Location: [facility];  Service: Cardiovascular;  Laterality: Right;  . PICC LINE PLACE PERIPHERAL ([place] HX)  06/2015  . RETINAL DETACHMENT SURGERY Right   . TEE WITHOUT CARDIOVERSION N/A 02/15/2014   Procedure: TRANSESOPHAGEAL ECHOCARDIOGRAM (TEE);  Surgeon: Josue Hector, MD;  Location: [facility];  Service: Cardiovascular;  Laterality: N/A;  . UMBILICAL HERNIA REPAIR  2006    There were no vitals filed for this visit.  Subjective Assessment - 07/23/19 1404    Subjective  No falls or pain to report.    Patient is accompained by:  Family member   spouse   Pertinent History  DNR; anxiety & depression, CAD with CABG, diverticulosis, gout, HLD, HTN, left hemiplegia, NSTEMI (2013), morbid obesity, PE (2016), CVA (2015), tubular adenoma of colon, OSA w/CPAP, right hip fx w/ORIF (2016), iron deficiency anemia    Patient Stated Goals  to obtain a Bioness for home use; to return to PLOF of ambulating household distances and short community distances with quad cane    Currently in Pain?  No/denies    Pain Score  0-No pain            [facility] Adult PT Treatment/Exercise - 07/23/19 1406      Transfers   Transfers  Sit to Stand;Stand to [employer] Transfers    Sit to Stand  4: Min guard;With upper extremity assist;From chair/3-in-1;With armrests    Stand to Sit  4: Min guard;With upper extremity assist;With armrests;To chair/3-in-1    Stand Pivot Transfers  4: Min assist    Stand Pivot Transfer Details (indicate cue type and reason)  with cane from wheelchair to Nustep with cues on sequencing       Ambulation/Gait   Ambulation/Gait  Yes    Ambulation/Gait Assistance  4: Min assist    Ambulation/Gait Assistance Details  cues for posture. PTA using my LE between pt's to guide left LE/prevent scissoring. pt able to self advance left LE with good foot clearance. gait concurrent with Bioness to left ant tib and quads.     Ambulation Distance (Feet)  20 Feet    Assistive device  Small based  quad cane    Gait Pattern  Step-to pattern;Decreased step length - left;Decreased stance time - left;Decreased hip/knee flexion - left;Decreased dorsiflexion - left;Poor foot clearance - left;Scissoring    Ambulation Surface  Level;Indoor      Exercises   Exercises  Other Exercises    Other Exercises   concurrent with Bioness to left LE: with left foot on balance board in ant/post direction working on active assist DF with on time, rest with off times for 10 reps; in standing with right UE support- mini squats with on time, rest with off times for 10 reps, guarding of left knee to prevent  buckling should it occur.        Knee/Hip Exercises: Aerobic   Nustep  Level 4 with Bioness to left LE ant tib/quad in training mode for 5 sec stim, 6 sec rest while pt kept steady pace of >/= 30 steps per minute for 5 minutes.      Acupuncturist Location  L anterior tibialis and L quad muscles    Electrical Stimulation Action  for increased muscle strengthening and activation with ex's and gait.     Electrical Stimulation Parameters  refer to tablet 2; round cloth electrodes    Electrical Stimulation Goals  Strength;Tone;Edema;Neuromuscular facilitation;Other (comment)          PT Short Term Goals - 07/06/19 1523      PT SHORT TERM GOAL #1   Title  Patient will be able to perform 2-3 exercises in standing for HEP  (RESET STG AFTER 30 DAYS)    Time  4    Period  Weeks    Status  Revised    Target Date  08/05/19      PT SHORT TERM GOAL #2   Title  Pt will be able to perform full TUG with cane    Baseline  1/2 TUG    Time  4    Period  Weeks    Status  Revised    Target Date  08/05/19      PT SHORT TERM GOAL #3   Title  Pt tolerates use of Bioness unit for transfer and gait training with quad cane and min A    Time  4    Period  Weeks    Status  Revised    Target Date  08/05/19      PT SHORT TERM GOAL #4   Title  Pt will improve gait velocity (over 10  foot distance) to >/= .5 ft/sec with cane and new AFO    Baseline  .17 ft/sec, no AFO with cane    Time  4    Period  Weeks    Status  Revised    Target Date  08/05/19      PT SHORT TERM GOAL #5   Title  Pt will consistently perform stand pivot transfers to L and R with cane and AFO with min A    Time  4    Period  Weeks    Status  Revised    Target Date  08/05/19        PT Long Term Goals - 07/06/19 1527      PT LONG TERM GOAL #1   Title  Patient and wife demonstrate and verbalizes independence with final HEP    Baseline  difficulty finding exercises for pt to do at home, sits and sleeps in recliner; no access to bed    Time  12    Period  Weeks    Status  Revised    Target Date  10/04/19      PT LONG TERM GOAL #2   Title  Pt will improve TUG with cane and Bioness/AFO to </= 60 seconds    Baseline  TBD for full TUG    Time  12    Period  Weeks    Status  Revised    Target Date  10/04/19      PT LONG TERM GOAL #3   Title  Patient will improve gait velocity over full distance with quad cane and most appropriate orthosis (bioness  or AFO) to >0.8 ft/sec    Time  12    Period  Weeks    Status  Revised    Target Date  10/04/19      PT LONG TERM GOAL #4   Title  Patient demonstrates improved L passive ankle DF by at least 10 degrees.    Baseline  lacking 24 deg with knee extended, lacking 10 deg with knee flexed > Lacking 20 deg with knee extended, lacking 16 deg with knee flexed    Time  12    Period  Weeks    Status  Revised    Target Date  10/04/19      PT LONG TERM GOAL #5   Title  Pt will demonstrate ability to transfer w/c <> mat/chair with quad cane, most appropriate orthosis and supervision    Baseline  min-mod A with quad cane    Time  12    Period  Weeks    Status  Revised    Target Date  10/04/19            Plan - 07/23/19 1405    Clinical Impression Statement  Continued with use of Bioness to left LE with ex's and gait. Continue to have no  reponse at the ant tib with Bioness, however pt clears foot with gait. Pt did ambulate further this session with only min assist from PTA to prevent scissoring. The pt is progressing toward goals and should benefit from continued PT to progress toward unmet goals.    Personal Factors and Comorbidities  Age;Fitness;Past/Current Experience;Comorbidity 3+;Time since onset of injury/illness/exacerbation    Comorbidities  anixety & depression, CAD, diverticulosis, gout, HLD, HTN, left hemiplegia, NSTEMI (2013), morbid obesity, PE (2016), CVA (2015), tubular adenoma of colon, OSA w/CPAP, right hip fx w/ORIF (2016), iron deficiency anemia    Examination-Activity Limitations  Squat;Stairs;Stand;Dressing;Transfers;Bed Mobility;Locomotion Level    Examination-Participation Restrictions  Church;Community Activity    Stability/Clinical Decision Making  Evolving/Moderate complexity    Rehab Potential  Good    PT Frequency  2x / week    PT Duration  12 weeks    PT Treatment/Interventions  ADLs/Self Care Home Management;Electrical Stimulation;DME Instruction;Balance training;Therapeutic exercise;Orthotic Fit/Training;Gait training;Neuromuscular re-education;Stair training;Functional mobility training;Patient/family education;Therapeutic activities;Manual techniques;Compression bandaging;Passive range of motion    PT Next Visit Plan  **DNR** assess vitals throughout session.  Bioness in training mode on Nustep for edema management and ROM?  Sit > stand training.  Stand pivots to L.  Gait with cane; endurance and standing tolerance.  Sidelying stretches and exercises with gravity minimized.    Consulted and Agree with Plan of Care  Patient;Family member/caregiver    Family Member Consulted  spouse       Patient will benefit from skilled therapeutic intervention in order to improve the following deficits and impairments:  Abnormal gait, Decreased range of motion, Difficulty walking, Impaired tone, Obesity, Impaired UE  functional use, Decreased endurance, Decreased activity tolerance, Decreased skin integrity, Decreased balance, Decreased knowledge of use of DME, Impaired flexibility, Postural dysfunction, Impaired sensation, Increased edema, Decreased strength, Decreased mobility  Visit Diagnosis: Other abnormalities of gait and mobility  Muscle weakness (generalized)  Foot drop, left  Spastic hemiplegia of left nondominant side as late effect of cerebral infarction (HCC)  Unsteadiness on feet  Stiffness of left ankle, not elsewhere classified  Abnormal posture     Problem List Patient Active Problem List   Diagnosis Date Noted  . Left foot drop 07/14/2019  . GERD (gastroesophageal  reflux disease) 06/18/2019  . Tinea cruris 07/20/2017  . Hemorrhoid 07/20/2017  . Irritability 06/08/2017  . Fall at home 05/06/2017  . Advance care planning 11/28/2016  . Skin mass 11/28/2016  . Lower urinary tract symptoms (LUTS) 09/05/2016  . Hearing loss 09/05/2016  . Excessive daytime sleepiness 04/16/2016  . Exposure to TB 02/21/2016  . Vitamin D deficiency 02/21/2016  . Anemia 11/10/2015  . Left thigh pain 11/10/2015  . Neck pain 10/12/2015  . Hemiplegia affecting left side in left-dominant patient as late effect of cerebrovascular disease ([place]) 09/03/2015  . Medicare annual wellness visit, subsequent 08/14/2015  . Acute pulmonary embolism ([place]) 07/25/2015  . Chest wall pain 07/15/2015  . History of pulmonary embolism 07/15/2015  . Chronic anticoagulation 05/22/2015  . Gait disturbance 05/15/2015  . DNR (do not resuscitate) 01/19/2015  . Hypotension 01/14/2015  . Paresthesia   . Depression 12/14/2014  . OSA (obstructive sleep apnea) 11/30/2014  . Chronic combined systolic and diastolic congestive heart failure ([place]) 11/11/2014  . SOB (shortness of breath) 04/17/2014  . Hemiplegia affecting left dominant side ([place]) 02/16/2014  . 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 06/30/2011    Willow Ora, PTA, [facility] [address], [place], [place] [address] [phone] 07/23/19, 4:52 PM   Name: MACADE ROBARTS MRN: [id number] Date of Birth: [birth year]

## 2019-07-28 ENCOUNTER — Ambulatory Visit: Payer: Medicare Other | Admitting: Physical Therapy

## 2019-07-28 ENCOUNTER — Encounter: Payer: Self-pay | Admitting: Physical Therapy

## 2019-07-28 ENCOUNTER — Other Ambulatory Visit: Payer: Self-pay

## 2019-07-28 DIAGNOSIS — M21372 Foot drop, left foot: Secondary | ICD-10-CM

## 2019-07-28 DIAGNOSIS — R2681 Unsteadiness on feet: Secondary | ICD-10-CM

## 2019-07-28 DIAGNOSIS — R2689 Other abnormalities of gait and mobility: Secondary | ICD-10-CM

## 2019-07-28 DIAGNOSIS — R296 Repeated falls: Secondary | ICD-10-CM | POA: Diagnosis not present

## 2019-07-28 DIAGNOSIS — I69354 Hemiplegia and hemiparesis following cerebral infarction affecting left non-dominant side: Secondary | ICD-10-CM

## 2019-07-28 DIAGNOSIS — M6281 Muscle weakness (generalized): Secondary | ICD-10-CM

## 2019-07-28 NOTE — Therapy (Signed)
[facility] [address] [place], [place], [address] Phone: [phone]   Fax:  [phone]  Physical Therapy Treatment  Patient Details  Name: Gregory Wiley MRN: [id number] Date of Birth: [birth year] Referring Provider (PT): Felecia Shelling, Nanine Means, MD   Encounter Date: 07/28/2019  PT End of Session - 07/28/19 1320    Visit Number  16    Number of Visits  34    Date for PT Re-Evaluation  10/04/19    Authorization Type  [employer] Medicare - 10th visit PN    Progress Note Due on Visit  20    PT Start Time  1317    PT Stop Time  1400    PT Time Calculation (min)  43 min    Equipment Utilized During Treatment  Gait belt;Other (comment)   Bioness   Activity Tolerance  Patient tolerated treatment well;No increased pain    Behavior During Therapy  WFL for tasks assessed/performed       Past Medical History:  Diagnosis Date  . Adrenal insufficiency ([place])   . Anxiety   . Basal cell carcinoma of skin of left ankle   . Bleeding stomach ulcer 2015  . Coronary artery disease    stent, then CABG 07/20/10  . Daily headache    "for the past month" (11/11/2014)  . Depression   . Diverticulosis   . GERD (gastroesophageal reflux disease)   . Gout   . H/O cardiomyopathy    ischemic, now last echo 07/30/11, EF 38%W  . History of hiatal hernia   . Hyperlipidemia   . Hypertension   . Internal hemorrhoids   . Left hemiplegia ([place])   . NSTEMI (non-ST elevated myocardial infarction) ([place])    NSTEMI- last cath 06/2011-Stent to LCX-DES, last nuc 07/30/11 low risk  . Obesity (BMI 30-39.9)   . OSA on CPAP    since July 2013- uses CPAP sometimes , states he has lost 147 lbs. since stroke & doesn't use the CPAP as much as he use to.   . Plantar fasciitis of left foot   . Pneumonia    hosp. 12/2014  . Pulmonary embolism ([place]) 03/2014  . Stroke ([place]) 01/2014   "no control of LUE, can slightly LLE; memory problems since" (11/11/2014)  . Tubular  adenoma of colon     Past Surgical History:  Procedure Laterality Date  . BASAL CELL CARCINOMA EXCISION Left 2015   ankle  . CARDIAC SURGERY    . CATARACT EXTRACTION Bilateral   . COLONOSCOPY N/A 02/10/2013   Procedure: COLONOSCOPY;  Surgeon: Ladene Artist, MD;  Location: [facility];  Service: Endoscopy;  Laterality: N/A;  . CORONARY ANGIOPLASTY WITH STENT PLACEMENT  07/01/2011   DES-Resolute to native LCX  . CORONARY ANGIOPLASTY WITH STENT PLACEMENT  12/01/2007   COMPLEX 5 LESION PCI INCLUDING CUTTING BALLOON AND 4 CYPHER DESs  . CORONARY ARTERY BYPASS GRAFT  07/20/2010    LIMA-LAD; VG-ACUTE MARG of RCA; Seq VG-distal RCA & then pda  . EP IMPLANTABLE DEVICE N/A 02/14/2016   Procedure: Loop Recorder Removal;  Surgeon: Thompson Grayer, MD;  Location: [facility];  Service: Cardiovascular;  Laterality: N/A;  . ESOPHAGOGASTRODUODENOSCOPY  01/2014   gastric ulcer, erosive gastroduodenitis, H Pylori negative.   . ESOPHAGOGASTRODUODENOSCOPY (EGD) WITH PROPOFOL N/A 11/14/2014   Procedure: ESOPHAGOGASTRODUODENOSCOPY (EGD) WITH PROPOFOL;  Surgeon: Milus Banister, MD;  Location: [facility];  Service: Endoscopy;  Laterality: N/A;  . HAND SURGERY Right  to take glass out  . HERNIA REPAIR    . INTRAMEDULLARY (IM) NAIL INTERTROCHANTERIC Right 02/07/2015   Procedure: INTRAMEDULLARY (IM) NAIL INTERTROCHANTRIC RIGHT HIP;  Surgeon: Renette Butters, MD;  Location: [facility];  Service: Orthopedics;  Laterality: Right;  . KNEE ARTHROSCOPY Right   . LEFT HEART CATHETERIZATION WITH CORONARY/GRAFT ANGIOGRAM  07/01/2011   Procedure: LEFT HEART CATHETERIZATION WITH Beatrix Fetters;  Surgeon: Sanda Klein, MD;  Location: [facility];  Service: Cardiovascular;;  . LOOP RECORDER IMPLANT  02/15/2014   MDT LINQ implanted by Dr Rayann Heman for cryptogenic stroke  . PERCUTANEOUS CORONARY STENT INTERVENTION (PCI-S) Right 07/01/2011   Procedure: PERCUTANEOUS CORONARY STENT INTERVENTION (PCI-S);  Surgeon:  Sanda Klein, MD;  Location: [facility] CATH LAB;  Service: Cardiovascular;  Laterality: Right;  . PICC LINE PLACE PERIPHERAL ([facility] HX)  06/2015  . RETINAL DETACHMENT SURGERY Right   . TEE WITHOUT CARDIOVERSION N/A 02/15/2014   Procedure: TRANSESOPHAGEAL ECHOCARDIOGRAM (TEE);  Surgeon: Josue Hector, MD;  Location: [facility] ENDOSCOPY;  Service: Cardiovascular;  Laterality: N/A;  . UMBILICAL HERNIA REPAIR  2006    There were no vitals filed for this visit.  Subjective Assessment - 07/28/19 1319    Subjective  No falls or pain to report. Has appt with Gerald Stabs at [facility] on this Friday at noon.    Patient is accompained by:  Family member   spouse   Pertinent History  DNR; anxiety & depression, CAD with CABG, diverticulosis, gout, HLD, HTN, left hemiplegia, NSTEMI (2013), morbid obesity, PE (2016), CVA (2015), tubular adenoma of colon, OSA w/CPAP, right hip fx w/ORIF (2016), iron deficiency anemia    Patient Stated Goals  to obtain a Bioness for home use; to return to PLOF of ambulating household distances and short community distances with quad cane    Currently in Pain?  No/denies    Pain Score  0-No pain          [facility] Adult PT Treatment/Exercise - 07/28/19 1321      Transfers   Transfers  Sit to Stand;Stand to [employer] Transfers    Sit to Stand  4: Min guard;With upper extremity assist;From chair/3-in-1;With armrests    Sit to Stand Details  Manual facilitation for weight bearing    Stand to Sit  4: Min guard;With upper extremity assist;With armrests;To chair/3-in-1    Stand Pivot Transfers  4: Min assist    Stand Pivot Transfer Details (indicate cue type and reason)  with quad cane from wheelchair to [employer]      Ambulation/Gait   Ambulation/Gait  Yes    Ambulation/Gait Assistance  4: Min assist    Ambulation/Gait Assistance Details  cues for posture, step length and placement. assist needed to decrease scissoring when going around curve, otherwise pt was able to self step without  scissoring over right LE with left LE.     Ambulation Distance (Feet)  20 Feet    Assistive device  Small based quad cane    Gait Pattern  Step-to pattern;Decreased step length - left;Decreased stance time - left;Decreased hip/knee flexion - left;Decreased dorsiflexion - left;Poor foot clearance - left;Scissoring    Ambulation Surface  Level;Indoor      Exercises   Exercises  Other Exercises    Other Exercises   concurrent with Bioness to left LE: with left foot on balance board in ant/post direction working on active assist DF with on time, rest with off times for 20 reps; in standing with right UE support- mini squats with  on time, rest with off times for 8 reps, guarding of left knee to prevent buckling should it occur.        Knee/Hip Exercises: Aerobic   Nustep  Level 4 with Bioness to left LE ant tib/quad in training mode for 5 sec stim, 6 sec rest while pt kept steady pace of >/= 30 steps per minute for 5 minutes.      Acupuncturist Location  L anterior tibialis and L quad muscles    Electrical Stimulation Action  for increased muscle activation and strengthening, to decrease LE edema and tone with mobility     Electrical Stimulation Parameters  refer to tablet 2 for adjusted parameters (tablet 1 has parameters as well); small round cloth electrodes    Electrical Stimulation Goals  Strength;Tone;Edema;Neuromuscular facilitation               PT Short Term Goals - 07/06/19 1523      PT SHORT TERM GOAL #1   Title  Patient will be able to perform 2-3 exercises in standing for HEP  (RESET STG AFTER 30 DAYS)    Time  4    Period  Weeks    Status  Revised    Target Date  08/05/19      PT SHORT TERM GOAL #2   Title  Pt will be able to perform full TUG with cane    Baseline  1/2 TUG    Time  4    Period  Weeks    Status  Revised    Target Date  08/05/19      PT SHORT TERM GOAL #3   Title  Pt tolerates use of Bioness unit for transfer and  gait training with quad cane and min A    Time  4    Period  Weeks    Status  Revised    Target Date  08/05/19      PT SHORT TERM GOAL #4   Title  Pt will improve gait velocity (over 10 foot distance) to >/= .5 ft/sec with cane and new AFO    Baseline  .17 ft/sec, no AFO with cane    Time  4    Period  Weeks    Status  Revised    Target Date  08/05/19      PT SHORT TERM GOAL #5   Title  Pt will consistently perform stand pivot transfers to L and R with cane and AFO with min A    Time  4    Period  Weeks    Status  Revised    Target Date  08/05/19        PT Long Term Goals - 07/06/19 1527      PT LONG TERM GOAL #1   Title  Patient and wife demonstrate and verbalizes independence with final HEP    Baseline  difficulty finding exercises for pt to do at home, sits and sleeps in recliner; no access to bed    Time  12    Period  Weeks    Status  Revised    Target Date  10/04/19      PT LONG TERM GOAL #2   Title  Pt will improve TUG with cane and Bioness/AFO to </= 60 seconds    Baseline  TBD for full TUG    Time  12    Period  Weeks    Status  Revised    Target  Date  10/04/19      PT LONG TERM GOAL #3   Title  Patient will improve gait velocity over full distance with quad cane and most appropriate orthosis (bioness or AFO) to >0.8 ft/sec    Time  12    Period  Weeks    Status  Revised    Target Date  10/04/19      PT LONG TERM GOAL #4   Title  Patient demonstrates improved L passive ankle DF by at least 10 degrees.    Baseline  lacking 24 deg with knee extended, lacking 10 deg with knee flexed > Lacking 20 deg with knee extended, lacking 16 deg with knee flexed    Time  12    Period  Weeks    Status  Revised    Target Date  10/04/19      PT LONG TERM GOAL #5   Title  Pt will demonstrate ability to transfer w/c <> mat/chair with quad cane, most appropriate orthosis and supervision    Baseline  min-mod A with quad cane    Time  12    Period  Weeks    Status   Revised    Target Date  10/04/19            Plan - 07/28/19 1320    Clinical Impression Statement  Today's skilled session continued to focus on use of Bioness with strengthening and gait. Continue to have decreased to no response to Bioness at anterior tib area, however pt able to actively DF enough to clear foot with gait. Less scissoring noted today with gait as well with assist needed for step placement with turns only. The pt is progressing toward goals and should benefit from continued PT to progress toward unmet goals.    Personal Factors and Comorbidities  Age;Fitness;Past/Current Experience;Comorbidity 3+;Time since onset of injury/illness/exacerbation    Comorbidities  anixety & depression, CAD, diverticulosis, gout, HLD, HTN, left hemiplegia, NSTEMI (2013), morbid obesity, PE (2016), CVA (2015), tubular adenoma of colon, OSA w/CPAP, right hip fx w/ORIF (2016), iron deficiency anemia    Examination-Activity Limitations  Squat;Stairs;Stand;Dressing;Transfers;Bed Mobility;Locomotion Level    Examination-Participation Restrictions  Church;Community Activity    Stability/Clinical Decision Making  Evolving/Moderate complexity    Rehab Potential  Good    PT Frequency  2x / week    PT Duration  12 weeks    PT Treatment/Interventions  ADLs/Self Care Home Management;Electrical Stimulation;DME Instruction;Balance training;Therapeutic exercise;Orthotic Fit/Training;Gait training;Neuromuscular re-education;Stair training;Functional mobility training;Patient/family education;Therapeutic activities;Manual techniques;Compression bandaging;Passive range of motion    PT Next Visit Plan  **DNR** assess vitals throughout session.  Bioness in training mode on Nustep for edema management and ROM?  Sit > stand training.  Stand pivots to L.  Gait with cane; endurance and standing tolerance.  Sidelying stretches and exercises with gravity minimized.    Consulted and Agree with Plan of Care  Patient;Family  member/caregiver    Family Member Consulted  spouse       Patient will benefit from skilled therapeutic intervention in order to improve the following deficits and impairments:  Abnormal gait, Decreased range of motion, Difficulty walking, Impaired tone, Obesity, Impaired UE functional use, Decreased endurance, Decreased activity tolerance, Decreased skin integrity, Decreased balance, Decreased knowledge of use of DME, Impaired flexibility, Postural dysfunction, Impaired sensation, Increased edema, Decreased strength, Decreased mobility  Visit Diagnosis: Other abnormalities of gait and mobility  Muscle weakness (generalized)  Foot drop, left  Spastic hemiplegia of left nondominant side as late  effect of cerebral infarction ([place])  Unsteadiness on feet     Problem List Patient Active Problem List   Diagnosis Date Noted  . Left foot drop 07/14/2019  . GERD (gastroesophageal reflux disease) 06/18/2019  . Tinea cruris 07/20/2017  . Hemorrhoid 07/20/2017  . Irritability 06/08/2017  . Fall at home 05/06/2017  . Advance care planning 11/28/2016  . Skin mass 11/28/2016  . Lower urinary tract symptoms (LUTS) 09/05/2016  . Hearing loss 09/05/2016  . Excessive daytime sleepiness 04/16/2016  . Exposure to TB 02/21/2016  . Vitamin D deficiency 02/21/2016  . Anemia 11/10/2015  . Left thigh pain 11/10/2015  . Neck pain 10/12/2015  . Hemiplegia affecting left side in left-dominant patient as late effect of cerebrovascular disease ([place]) 09/03/2015  . Medicare annual wellness visit, subsequent 08/14/2015  . Acute pulmonary embolism ([place]) 07/25/2015  . Chest wall pain 07/15/2015  . History of pulmonary embolism 07/15/2015  . Chronic anticoagulation 05/22/2015  . Gait disturbance 05/15/2015  . DNR (do not resuscitate) 01/19/2015  . Hypotension 01/14/2015  . Paresthesia   . Depression 12/14/2014  . OSA (obstructive sleep apnea) 11/30/2014  . Chronic combined systolic and diastolic  congestive heart failure ([place]) 11/11/2014  . SOB (shortness of breath) 04/17/2014  . Hemiplegia affecting left dominant side ([place]) 02/16/2014  . 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 06/30/2011    Willow Ora, PTA, [facility] [address], [place], [place] [address] [phone] 07/28/19, 4:41 PM   Name: Gregory Wiley MRN: [id number] Date of Birth: [birth year]

## 2019-07-30 ENCOUNTER — Encounter: Payer: Self-pay | Admitting: Physical Therapy

## 2019-07-30 ENCOUNTER — Other Ambulatory Visit: Payer: Self-pay

## 2019-07-30 ENCOUNTER — Ambulatory Visit: Payer: Medicare Other | Admitting: Physical Therapy

## 2019-07-30 DIAGNOSIS — M21372 Foot drop, left foot: Secondary | ICD-10-CM

## 2019-07-30 DIAGNOSIS — R296 Repeated falls: Secondary | ICD-10-CM | POA: Diagnosis not present

## 2019-07-30 DIAGNOSIS — M6281 Muscle weakness (generalized): Secondary | ICD-10-CM

## 2019-07-30 DIAGNOSIS — I69354 Hemiplegia and hemiparesis following cerebral infarction affecting left non-dominant side: Secondary | ICD-10-CM

## 2019-07-30 DIAGNOSIS — R2681 Unsteadiness on feet: Secondary | ICD-10-CM

## 2019-07-30 NOTE — Therapy (Signed)
[facility] [address] [place], [place], [address] Phone: [phone]   Fax:  [phone]  Physical Therapy Treatment  Patient Details  Name: Gregory Wiley MRN: [id number] Date of Birth: 1950/07/30 Referring Provider (PT): Felecia Shelling, Nanine Means, MD   Encounter Date: 07/30/2019  PT End of Session - 07/30/19 1322    Visit Number  17    Number of Visits  34    Date for PT Re-Evaluation  10/04/19    Authorization Type  [employer] Medicare - 10th visit PN    Progress Note Due on Visit  20    PT Start Time  1318    PT Stop Time  1359    PT Time Calculation (min)  41 min    Equipment Utilized During Treatment  Gait belt;Other (comment)   Bioness   Activity Tolerance  Patient tolerated treatment well;No increased pain    Behavior During Therapy  WFL for tasks assessed/performed       Past Medical History:  Diagnosis Date  . Adrenal insufficiency ([place])   . Anxiety   . Basal cell carcinoma of skin of left ankle   . Bleeding stomach ulcer 2015  . Coronary artery disease    stent, then CABG 07/20/10  . Daily headache    "for the past month" (11/11/2014)  . Depression   . Diverticulosis   . GERD (gastroesophageal reflux disease)   . Gout   . H/O cardiomyopathy    ischemic, now last echo 07/30/11, EF 38%W  . History of hiatal hernia   . Hyperlipidemia   . Hypertension   . Internal hemorrhoids   . Left hemiplegia ([place])   . NSTEMI (non-ST elevated myocardial infarction) ([place])    NSTEMI- last cath 06/2011-Stent to LCX-DES, last nuc 07/30/11 low risk  . Obesity (BMI 30-39.9)   . OSA on CPAP    since July 2013- uses CPAP sometimes , states he has lost 147 lbs. since stroke & doesn't use the CPAP as much as he use to.   . Plantar fasciitis of left foot   . Pneumonia    hosp. 12/2014  . Pulmonary embolism ([place]) 03/2014  . Stroke ([place]) 01/2014   "no control of LUE, can slightly LLE; memory problems since" (11/11/2014)  . Tubular  adenoma of colon     Past Surgical History:  Procedure Laterality Date  . BASAL CELL CARCINOMA EXCISION Left 2015   ankle  . CARDIAC SURGERY    . CATARACT EXTRACTION Bilateral   . COLONOSCOPY N/A 02/10/2013   Procedure: COLONOSCOPY;  Surgeon: Ladene Artist, MD;  Location: [facility];  Service: Endoscopy;  Laterality: N/A;  . CORONARY ANGIOPLASTY WITH STENT PLACEMENT  07/01/2011   DES-Resolute to native LCX  . CORONARY ANGIOPLASTY WITH STENT PLACEMENT  12/01/2007   COMPLEX 5 LESION PCI INCLUDING CUTTING BALLOON AND 4 CYPHER DESs  . CORONARY ARTERY BYPASS GRAFT  07/20/2010    LIMA-LAD; VG-ACUTE MARG of RCA; Seq VG-distal RCA & then pda  . EP IMPLANTABLE DEVICE N/A 02/14/2016   Procedure: Loop Recorder Removal;  Surgeon: Thompson Grayer, MD;  Location: [facility];  Service: Cardiovascular;  Laterality: N/A;  . ESOPHAGOGASTRODUODENOSCOPY  01/2014   gastric ulcer, erosive gastroduodenitis, H Pylori negative.   . ESOPHAGOGASTRODUODENOSCOPY (EGD) WITH PROPOFOL N/A 11/14/2014   Procedure: ESOPHAGOGASTRODUODENOSCOPY (EGD) WITH PROPOFOL;  Surgeon: Milus Banister, MD;  Location: [facility];  Service: Endoscopy;  Laterality: N/A;  . HAND SURGERY Right  to take glass out  . HERNIA REPAIR    . INTRAMEDULLARY (IM) NAIL INTERTROCHANTERIC Right 02/07/2015   Procedure: INTRAMEDULLARY (IM) NAIL INTERTROCHANTRIC RIGHT HIP;  Surgeon: Renette Butters, MD;  Location: [facility];  Service: Orthopedics;  Laterality: Right;  . KNEE ARTHROSCOPY Right   . LEFT HEART CATHETERIZATION WITH CORONARY/GRAFT ANGIOGRAM  07/01/2011   Procedure: LEFT HEART CATHETERIZATION WITH Beatrix Fetters;  Surgeon: Sanda Klein, MD;  Location: [facility];  Service: Cardiovascular;;  . LOOP RECORDER IMPLANT  02/15/2014   MDT LINQ implanted by Dr Rayann Heman for cryptogenic stroke  . PERCUTANEOUS CORONARY STENT INTERVENTION (PCI-S) Right 07/01/2011   Procedure: PERCUTANEOUS CORONARY STENT INTERVENTION (PCI-S);  Surgeon:  Sanda Klein, MD;  Location: [facility] C[facility];  Service: Cardiovascular;  Laterality: Right;  . PICC LINE PLACE PERIPHERAL ([place] HX)  06/2015  . RETINAL DETACHMENT SURGERY Right   . TEE WITHOUT CARDIOVERSION N/A 02/15/2014   Procedure: TRANSESOPHAGEAL ECHOCARDIOGRAM (TEE);  Surgeon: Josue Hector, MD;  Location[facility] ENDOSCOPY;  Service: Cardiovascular;  Laterality: N/A;  . UMBILICAL HERNIA REPAIR  2006    There were no vitals filed for this visit.  Subjective Assessment - 07/30/19 1320    Subjective  No new complaints. Saw Chris. Going with a custom brace which he was molded for today. See's him again at 12pm on 08/20/19. No falls.    Patient is accompained by:  Family member   spouse   Pertinent History  DNR; anxiety & depression, CAD with CABG, diverticulosis, gout, HLD, HTN, left hemiplegia, NSTEMI (2013), morbid obesity, PE (2016), CVA (2015), tubular adenoma of colon, OSA w/CPAP, right hip fx w/ORIF (2016), iron deficiency anemia    Limitations  Standing;Walking    Patient Stated Goals  to obtain a Bioness for home use; to return to PLOF of ambulating household distances and short community distances with quad cane    Currently in Pain?  No/denies    Pain Score  0-No pain            [facility] Adult PT Treatment/Exercise - 07/30/19 1437      Transfers   Transfers  Sit to Stand;Stand to Sit    Sit to Stand  4: Min guard;With upper extremity assist;From chair/3-in-1;With armrests    Sit to Stand Details  Tactile cues for weight shifting;Verbal cues for sequencing;Verbal cues for safe use of DME/AE;Verbal cues for precautions/safety    Stand to Sit  4: Min guard;With upper extremity assist;With armrests;To chair/3-in-1      Exercises   Exercises  Other Exercises    Other Exercises   concurrent with Bioness to left LE: with left foot on balance board in ant/post direction working on active assist DF with on time, rest with off times for 20 reps; in standing with right UE support- mini squats  with on time, rest with off times for 7 reps, then another 4 reps after a seated rest break. guarding of left knee to prevent buckling should it occur.        Acupuncturist Location  L anterior tibialis and L quad muscles    Electrical Stimulation Action  for increased muscle activation and strengthening with ex's.     Electrical Stimulation Parameters  refer to tablet 2 for adjusted parameters; trial of hydrogel electrodes this session.     Electrical Stimulation Goals  Strength;Tone;Edema;Neuromuscular facilitation         PT Short Term Goals - 07/06/19 1523  PT SHORT TERM GOAL #1   Title  Patient will be able to perform 2-3 exercises in standing for HEP  (RESET STG AFTER 30 DAYS)    Time  4    Period  Weeks    Status  Revised    Target Date  08/05/19      PT SHORT TERM GOAL #2   Title  Pt will be able to perform full TUG with cane    Baseline  1/2 TUG    Time  4    Period  Weeks    Status  Revised    Target Date  08/05/19      PT SHORT TERM GOAL #3   Title  Pt tolerates use of Bioness unit for transfer and gait training with quad cane and min A    Time  4    Period  Weeks    Status  Revised    Target Date  08/05/19      PT SHORT TERM GOAL #4   Title  Pt will improve gait velocity (over 10 foot distance) to >/= .5 ft/sec with cane and new AFO    Baseline  .17 ft/sec, no AFO with cane    Time  4    Period  Weeks    Status  Revised    Target Date  08/05/19      PT SHORT TERM GOAL #5   Title  Pt will consistently perform stand pivot transfers to L and R with cane and AFO with min A    Time  4    Period  Weeks    Status  Revised    Target Date  08/05/19        PT Long Term Goals - 07/06/19 1527      PT LONG TERM GOAL #1   Title  Patient and wife demonstrate and verbalizes independence with final HEP    Baseline  difficulty finding exercises for pt to do at home, sits and sleeps in recliner; no access to bed    Time  12     Period  Weeks    Status  Revised    Target Date  10/04/19      PT LONG TERM GOAL #2   Title  Pt will improve TUG with cane and Bioness/AFO to </= 60 seconds    Baseline  TBD for full TUG    Time  12    Period  Weeks    Status  Revised    Target Date  10/04/19      PT LONG TERM GOAL #3   Title  Patient will improve gait velocity over full distance with quad cane and most appropriate orthosis (bioness or AFO) to >0.8 ft/sec    Time  12    Period  Weeks    Status  Revised    Target Date  10/04/19      PT LONG TERM GOAL #4   Title  Patient demonstrates improved L passive ankle DF by at least 10 degrees.    Baseline  lacking 24 deg with knee extended, lacking 10 deg with knee flexed > Lacking 20 deg with knee extended, lacking 16 deg with knee flexed    Time  12    Period  Weeks    Status  Revised    Target Date  10/04/19      PT LONG TERM GOAL #5   Title  Pt will demonstrate ability to transfer w/c <> mat/chair  with quad cane, most appropriate orthosis and supervision    Baseline  min-mod A with quad cane    Time  12    Period  Weeks    Status  Revised    Target Date  10/04/19            Plan - 07/30/19 1322    Clinical Impression Statement  Today's skilled session continued to focus on use of Bioness to left LE. Trialed new Hydrogel electrodes with no response at anterior tib noted. Continued to use Bioness AA with ex's for strengthening and edema management. No issues reported. The pt is progressing toward goals and should benefit from continued PT to progress toward unmet goals.    Personal Factors and Comorbidities  Age;Fitness;Past/Current Experience;Comorbidity 3+;Time since onset of injury/illness/exacerbation    Comorbidities  anixety & depression, CAD, diverticulosis, gout, HLD, HTN, left hemiplegia, NSTEMI (2013), morbid obesity, PE (2016), CVA (2015), tubular adenoma of colon, OSA w/CPAP, right hip fx w/ORIF (2016), iron deficiency anemia    Examination-Activity  Limitations  Squat;Stairs;Stand;Dressing;Transfers;Bed Mobility;Locomotion Level    Examination-Participation Restrictions  Church;Community Activity    Stability/Clinical Decision Making  Evolving/Moderate complexity    Rehab Potential  Good    PT Frequency  2x / week    PT Duration  12 weeks    PT Treatment/Interventions  ADLs/Self Care Home Management;Electrical Stimulation;DME Instruction;Balance training;Therapeutic exercise;Orthotic Fit/Training;Gait training;Neuromuscular re-education;Stair training;Functional mobility training;Patient/family education;Therapeutic activities;Manual techniques;Compression bandaging;Passive range of motion    PT Next Visit Plan  **DNR** assess vitals throughout session. work on standing balance and tolerance; left LE stretching and exercises with gravity minimized; Bioness in training mode for ex's and with gait with cane.    Consulted and Agree with Plan of Care  Patient;Family member/caregiver    Family Member Consulted  spouse       Patient will benefit from skilled therapeutic intervention in order to improve the following deficits and impairments:  Abnormal gait, Decreased range of motion, Difficulty walking, Impaired tone, Obesity, Impaired UE functional use, Decreased endurance, Decreased activity tolerance, Decreased skin integrity, Decreased balance, Decreased knowledge of use of DME, Impaired flexibility, Postural dysfunction, Impaired sensation, Increased edema, Decreased strength, Decreased mobility  Visit Diagnosis: Muscle weakness (generalized)  Foot drop, left  Spastic hemiplegia of left nondominant side as late effect of cerebral infarction ([facility])  Unsteadiness on feet     Problem List Patient Active Problem List   Diagnosis Date Noted  . Left foot drop 07/14/2019  . GERD (gastroesophageal reflux disease) 06/18/2019  . Tinea cruris 07/20/2017  . Hemorrhoid 07/20/2017  . Irritability 06/08/2017  . Fall at home 05/06/2017  .  Advance care planning 11/28/2016  . Skin mass 11/28/2016  . Lower urinary tract symptoms (LUTS) 09/05/2016  . Hearing loss 09/05/2016  . Excessive daytime sleepiness 04/16/2016  . Exposure to TB 02/21/2016  . Vitamin D deficiency 02/21/2016  . Anemia 11/10/2015  . Left thigh pain 11/10/2015  . Neck pain 10/12/2015  . Hemiplegia affecting left side in left-dominant patient as late effect of cerebrovascular disease ([place]) 09/03/2015  . Medicare annual wellness visit, subsequent 08/14/2015  . Acute pulmonary embolism ([place]) 07/25/2015  . Chest wall pain 07/15/2015  . History of pulmonary embolism 07/15/2015  . Chronic anticoagulation 05/22/2015  . Gait disturbance 05/15/2015  . DNR (do not resuscitate) 01/19/2015  . Hypotension 01/14/2015  . Paresthesia   . Depression 12/14/2014  . OSA (obstructive sleep apnea) 11/30/2014  . Chronic combined systolic and diastolic congestive heart failure (  [place]) 11/11/2014  . SOB (shortness of breath) 04/17/2014  . Hemiplegia affecting left dominant side ([place]) 02/16/2014  . 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 06/30/2011    Willow Ora, PTA, [facility] [address] [place], [place] [address] [phone] 07/30/19, 2:44 PM   Name: Gregory Wiley MRN: [id number] Date of Birth: 09-Apr-1950

## 2019-08-03 ENCOUNTER — Ambulatory Visit: Payer: Medicare Other | Admitting: Physical Therapy

## 2019-08-03 ENCOUNTER — Other Ambulatory Visit: Payer: Self-pay

## 2019-08-03 ENCOUNTER — Encounter: Payer: Self-pay | Admitting: Physical Therapy

## 2019-08-03 DIAGNOSIS — M21372 Foot drop, left foot: Secondary | ICD-10-CM

## 2019-08-03 DIAGNOSIS — R2681 Unsteadiness on feet: Secondary | ICD-10-CM

## 2019-08-03 DIAGNOSIS — R296 Repeated falls: Secondary | ICD-10-CM | POA: Diagnosis not present

## 2019-08-03 DIAGNOSIS — M6281 Muscle weakness (generalized): Secondary | ICD-10-CM

## 2019-08-03 NOTE — Therapy (Signed)
[facility] [address] [place], [place], [address] Phone: [phone]   Fax:  [phone]  Physical Therapy Treatment  Patient Details  Name: Gregory Wiley MRN: [id number] Date of Birth: 1950/08/28 Referring Provider (PT): Felecia Shelling, Nanine Means, MD   Encounter Date: 08/03/2019  PT End of Session - 08/03/19 1408    Visit Number  18    Number of Visits  34    Date for PT Re-Evaluation  10/04/19    Authorization Type  [employer] Medicare - 10th visit PN    Progress Note Due on Visit  20    PT Start Time  1402    PT Stop Time  1445    PT Time Calculation (min)  43 min    Equipment Utilized During Treatment  Gait belt;Other (comment)   Bioness   Activity Tolerance  Patient tolerated treatment well;No increased pain    Behavior During Therapy  WFL for tasks assessed/performed       Past Medical History:  Diagnosis Date  . Adrenal insufficiency ([place])   . Anxiety   . Basal cell carcinoma of skin of left ankle   . Bleeding stomach ulcer 2015  . Coronary artery disease    stent, then CABG 07/20/10  . Daily headache    "for the past month" (11/11/2014)  . Depression   . Diverticulosis   . GERD (gastroesophageal reflux disease)   . Gout   . H/O cardiomyopathy    ischemic, now last echo 07/30/11, EF 38%W  . History of hiatal hernia   . Hyperlipidemia   . Hypertension   . Internal hemorrhoids   . Left hemiplegia ([place])   . NSTEMI (non-ST elevated myocardial infarction) ([place])    NSTEMI- last cath 06/2011-Stent to LCX-DES, last nuc 07/30/11 low risk  . Obesity (BMI 30-39.9)   . OSA on CPAP    since July 2013- uses CPAP sometimes , states he has lost 147 lbs. since stroke & doesn't use the CPAP as much as he use to.   . Plantar fasciitis of left foot   . Pneumonia    hosp. 12/2014  . Pulmonary embolism ([place]) 03/2014  . Stroke ([place]) 01/2014   "no control of LUE, can slightly LLE; memory problems since" (11/11/2014)  . Tubular  adenoma of colon     Past Surgical History:  Procedure Laterality Date  . BASAL CELL CARCINOMA EXCISION Left 2015   ankle  . CARDIAC SURGERY    . CATARACT EXTRACTION Bilateral   . COLONOSCOPY N/A 02/10/2013   Procedure: COLONOSCOPY;  Surgeon: Ladene Artist, MD;  Location: [facility];  Service: Endoscopy;  Laterality: N/A;  . CORONARY ANGIOPLASTY WITH STENT PLACEMENT  07/01/2011   DES-Resolute to native LCX  . CORONARY ANGIOPLASTY WITH STENT PLACEMENT  12/01/2007   COMPLEX 5 LESION PCI INCLUDING CUTTING BALLOON AND 4 CYPHER DESs  . CORONARY ARTERY BYPASS GRAFT  07/20/2010    LIMA-LAD; VG-ACUTE MARG of RCA; Seq VG-distal RCA & then pda  . EP IMPLANTABLE DEVICE N/A 02/14/2016   Procedure: Loop Recorder Removal;  Surgeon: Thompson Grayer, MD;  Location: [facility];  Service: Cardiovascular;  Laterality: N/A;  . ESOPHAGOGASTRODUODENOSCOPY  01/2014   gastric ulcer, erosive gastroduodenitis, H Pylori negative.   . ESOPHAGOGASTRODUODENOSCOPY (EGD) WITH PROPOFOL N/A 11/14/2014   Procedure: ESOPHAGOGASTRODUODENOSCOPY (EGD) WITH PROPOFOL;  Surgeon: Milus Banister, MD;  Location: [facility];  Service: Endoscopy;  Laterality: N/A;  . HAND SURGERY Right  to take glass out  . HERNIA REPAIR    . INTRAMEDULLARY (IM) NAIL INTERTROCHANTERIC Right 02/07/2015   Procedure: INTRAMEDULLARY (IM) NAIL INTERTROCHANTRIC RIGHT HIP;  Surgeon: Renette Butters, MD;  Location: [facility];  Service: Orthopedics;  Laterality: Right;  . KNEE ARTHROSCOPY Right   . LEFT HEART CATHETERIZATION WITH CORONARY/GRAFT ANGIOGRAM  07/01/2011   Procedure: LEFT HEART CATHETERIZATION WITH Beatrix Fetters;  Surgeon: Sanda Klein, MD;  Location: [facility];  Service: Cardiovascular;;  . LOOP RECORDER IMPLANT  02/15/2014   MDT LINQ implanted by Dr Rayann Heman for cryptogenic stroke  . PERCUTANEOUS CORONARY STENT INTERVENTION (PCI-S) Right 07/01/2011   Procedure: PERCUTANEOUS CORONARY STENT INTERVENTION (PCI-S);  Surgeon:  Sanda Klein, MD;  Location: [facility] C[facility] LAB;  Service: Cardiovascular;  Laterality: Right;  . PICC LINE PLACE PERIPHERAL ([place] HX)  06/2015  . RETINAL DETACHMENT SURGERY Right   . TEE WITHOUT CARDIOVERSION N/A 02/15/2014   Procedure: TRANSESOPHAGEAL ECHOCARDIOGRAM (TEE);  Surgeon: Josue Hector, MD;  Location: [facility];  Service: Cardiovascular;  Laterality: N/A;  . UMBILICAL HERNIA REPAIR  2006    There were no vitals filed for this visit.  Subjective Assessment - 08/03/19 1407    Subjective  No new complaints. No falls. Had his first vacinne shot on Thurday with no issues.    Patient is accompained by:  Family member   spouse   Pertinent History  DNR; anxiety & depression, CAD with CABG, diverticulosis, gout, HLD, HTN, left hemiplegia, NSTEMI (2013), morbid obesity, PE (2016), CVA (2015), tubular adenoma of colon, OSA w/CPAP, right hip fx w/ORIF (2016), iron deficiency anemia    Patient Stated Goals  to obtain a Bioness for home use; to return to PLOF of ambulating household distances and short community distances with quad cane    Currently in Pain?  No/denies    Pain Score  0-No pain            [facility] Adult PT Treatment/Exercise - 08/03/19 1408      Transfers   Transfers  Sit to Stand;Stand to Sit    Sit to Stand  4: Min guard;With upper extremity assist;From chair/3-in-1    Sit to Stand Details  Tactile cues for weight shifting;Verbal cues for sequencing;Verbal cues for safe use of DME/AE;Verbal cues for precautions/safety    Sit to Stand Details (indicate cue type and reason)  cues to get closer to edge of chair, for left LE placement and for increased anterior weight shifting    Stand to Sit  4: Min guard;With upper extremity assist;To chair/3-in-1      Exercises   Exercises  Other Exercises    Other Exercises   concurrent with Bioness to left LE: with left foot on balance board in ant/post direction working on active assist DF with on time, rest with off times for 20  reps; in standing with right UE support- mini squats with on time, rest with off times for 10 reps. guarding of left knee to prevent buckling should it occur.; in stanidng with right UE support- left foot tap to 2 inch box/back to floor with on times, rest with of times for 5 reps, seated rest break, then 4 more reps. assist needed to lift LE up onto step with each rep.       Acupuncturist Location  L anterior tibialis and L quad muscles    Electrical Stimulation Action  for increased muscle activation and strengthening with exercises    Electrical Stimulation  Parameters  refer to tablet 2 for adjusted parameters; hydrogel electrodes    Electrical Stimulation Goals  Strength;Tone;Edema;Neuromuscular facilitation           PT Short Term Goals - 07/06/19 1523      PT SHORT TERM GOAL #1   Title  Patient will be able to perform 2-3 exercises in standing for HEP  (RESET STG AFTER 30 DAYS)    Time  4    Period  Weeks    Status  Revised    Target Date  08/05/19      PT SHORT TERM GOAL #2   Title  Pt will be able to perform full TUG with cane    Baseline  1/2 TUG    Time  4    Period  Weeks    Status  Revised    Target Date  08/05/19      PT SHORT TERM GOAL #3   Title  Pt tolerates use of Bioness unit for transfer and gait training with quad cane and min A    Time  4    Period  Weeks    Status  Revised    Target Date  08/05/19      PT SHORT TERM GOAL #4   Title  Pt will improve gait velocity (over 10 foot distance) to >/= .5 ft/sec with cane and new AFO    Baseline  .17 ft/sec, no AFO with cane    Time  4    Period  Weeks    Status  Revised    Target Date  08/05/19      PT SHORT TERM GOAL #5   Title  Pt will consistently perform stand pivot transfers to L and R with cane and AFO with min A    Time  4    Period  Weeks    Status  Revised    Target Date  08/05/19        PT Long Term Goals - 07/06/19 1527      PT LONG TERM GOAL #1    Title  Patient and wife demonstrate and verbalizes independence with final HEP    Baseline  difficulty finding exercises for pt to do at home, sits and sleeps in recliner; no access to bed    Time  12    Period  Weeks    Status  Revised    Target Date  10/04/19      PT LONG TERM GOAL #2   Title  Pt will improve TUG with cane and Bioness/AFO to </= 60 seconds    Baseline  TBD for full TUG    Time  12    Period  Weeks    Status  Revised    Target Date  10/04/19      PT LONG TERM GOAL #3   Title  Patient will improve gait velocity over full distance with quad cane and most appropriate orthosis (bioness or AFO) to >0.8 ft/sec    Time  12    Period  Weeks    Status  Revised    Target Date  10/04/19      PT LONG TERM GOAL #4   Title  Patient demonstrates improved L passive ankle DF by at least 10 degrees.    Baseline  lacking 24 deg with knee extended, lacking 10 deg with knee flexed > Lacking 20 deg with knee extended, lacking 16 deg with knee flexed    Time  12  Period  Weeks    Status  Revised    Target Date  10/04/19      PT LONG TERM GOAL #5   Title  Pt will demonstrate ability to transfer w/c <> mat/chair with quad cane, most appropriate orthosis and supervision    Baseline  min-mod A with quad cane    Time  12    Period  Weeks    Status  Revised    Target Date  10/04/19            Plan - 08/03/19 1408    Clinical Impression Statement  Today's skilled session continued with use of Bioness to left LE concurrent with ex's. Continue to have limited response to lower cuff, no issues with thigh cuff. The pt was able to do more standing ex's today with rest breaks. The pt should benefit from continued PT progress toward unmet goals.    Personal Factors and Comorbidities  Age;Fitness;Past/Current Experience;Comorbidity 3+;Time since onset of injury/illness/exacerbation    Comorbidities  anixety & depression, CAD, diverticulosis, gout, HLD, HTN, left hemiplegia, NSTEMI  (2013), morbid obesity, PE (2016), CVA (2015), tubular adenoma of colon, OSA w/CPAP, right hip fx w/ORIF (2016), iron deficiency anemia    Examination-Activity Limitations  Squat;Stairs;Stand;Dressing;Transfers;Bed Mobility;Locomotion Level    Examination-Participation Restrictions  Church;Community Activity    Stability/Clinical Decision Making  Evolving/Moderate complexity    Rehab Potential  Good    PT Frequency  2x / week    PT Duration  12 weeks    PT Treatment/Interventions  ADLs/Self Care Home Management;Electrical Stimulation;DME Instruction;Balance training;Therapeutic exercise;Orthotic Fit/Training;Gait training;Neuromuscular re-education;Stair training;Functional mobility training;Patient/family education;Therapeutic activities;Manual techniques;Compression bandaging;Passive range of motion    PT Next Visit Plan  **DNR** assess vitals throughout session. work on standing balance and tolerance; left LE stretching and exercises with gravity minimized; Bioness in training mode for ex's and with gait with cane.    Consulted and Agree with Plan of Care  Patient;Family member/caregiver    Family Member Consulted  spouse       Patient will benefit from skilled therapeutic intervention in order to improve the following deficits and impairments:  Abnormal gait, Decreased range of motion, Difficulty walking, Impaired tone, Obesity, Impaired UE functional use, Decreased endurance, Decreased activity tolerance, Decreased skin integrity, Decreased balance, Decreased knowledge of use of DME, Impaired flexibility, Postural dysfunction, Impaired sensation, Increased edema, Decreased strength, Decreased mobility  Visit Diagnosis: Muscle weakness (generalized)  Foot drop, left  Unsteadiness on feet     Problem List Patient Active Problem List   Diagnosis Date Noted  . Left foot drop 07/14/2019  . GERD (gastroesophageal reflux disease) 06/18/2019  . Tinea cruris 07/20/2017  . Hemorrhoid  07/20/2017  . Irritability 06/08/2017  . Fall at home 05/06/2017  . Advance care planning 11/28/2016  . Skin mass 11/28/2016  . Lower urinary tract symptoms (LUTS) 09/05/2016  . Hearing loss 09/05/2016  . Excessive daytime sleepiness 04/16/2016  . Exposure to TB 02/21/2016  . Vitamin D deficiency 02/21/2016  . Anemia 11/10/2015  . Left thigh pain 11/10/2015  . Neck pain 10/12/2015  . Hemiplegia affecting left side in left-dominant patient as late effect of cerebrovascular disease ([place]) 09/03/2015  . Medicare annual wellness visit, subsequent 08/14/2015  . Acute pulmonary embolism ([place]) 07/25/2015  . Chest wall pain 07/15/2015  . History of pulmonary embolism 07/15/2015  . Chronic anticoagulation 05/22/2015  . Gait disturbance 05/15/2015  . DNR (do not resuscitate) 01/19/2015  . Hypotension 01/14/2015  . Paresthesia   .  Depression 12/14/2014  . OSA (obstructive sleep apnea) 11/30/2014  . Chronic combined systolic and diastolic congestive heart failure ([place]) 11/11/2014  . SOB (shortness of breath) 04/17/2014  . Hemiplegia affecting left dominant side ([place]) 02/16/2014  . 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 06/30/2011    Willow Ora, PTA, [facility] [address], [place], [place] [address] [phone] 08/03/19, 3:40 PM   Name: ROC TEEM MRN: [id number] Date of Birth: 22-Dec-1950

## 2019-08-05 ENCOUNTER — Telehealth: Payer: Self-pay | Admitting: *Deleted

## 2019-08-05 ENCOUNTER — Other Ambulatory Visit: Payer: Self-pay

## 2019-08-05 ENCOUNTER — Encounter: Payer: Self-pay | Admitting: Physical Therapy

## 2019-08-05 ENCOUNTER — Ambulatory Visit: Payer: Medicare Other | Admitting: Physical Therapy

## 2019-08-05 DIAGNOSIS — M25672 Stiffness of left ankle, not elsewhere classified: Secondary | ICD-10-CM

## 2019-08-05 DIAGNOSIS — M6281 Muscle weakness (generalized): Secondary | ICD-10-CM

## 2019-08-05 DIAGNOSIS — R2689 Other abnormalities of gait and mobility: Secondary | ICD-10-CM

## 2019-08-05 DIAGNOSIS — R296 Repeated falls: Secondary | ICD-10-CM

## 2019-08-05 DIAGNOSIS — I69354 Hemiplegia and hemiparesis following cerebral infarction affecting left non-dominant side: Secondary | ICD-10-CM

## 2019-08-05 DIAGNOSIS — R2681 Unsteadiness on feet: Secondary | ICD-10-CM

## 2019-08-05 DIAGNOSIS — M21372 Foot drop, left foot: Secondary | ICD-10-CM

## 2019-08-05 DIAGNOSIS — R293 Abnormal posture: Secondary | ICD-10-CM

## 2019-08-05 NOTE — Therapy (Signed)
[facility] [address] [place], [place], [address] Phone: [phone]   Fax:  [phone]  Physical Therapy Treatment  Patient Details  Name: Gregory Wiley MRN: [id number] Date of Birth: 1950/07/11 Referring Provider (PT): Felecia Shelling, Nanine Means, MD   Encounter Date: 08/05/2019  PT End of Session - 08/05/19 1105    Visit Number  18    Number of Visits  34    Date for PT Re-Evaluation  10/04/19    Authorization Type  [employer] Medicare - 10th visit PN    Progress Note Due on Visit  20    PT Start Time  1102    PT Stop Time  1145    PT Time Calculation (min)  43 min    Equipment Utilized During Treatment  Gait belt;Other (comment)   Bioness   Activity Tolerance  Patient tolerated treatment well;No increased pain    Behavior During Therapy  WFL for tasks assessed/performed       Past Medical History:  Diagnosis Date  . Adrenal insufficiency ([place])   . Anxiety   . Basal cell carcinoma of skin of left ankle   . Bleeding stomach ulcer 2015  . Coronary artery disease    stent, then CABG 07/20/10  . Daily headache    "for the past month" (11/11/2014)  . Depression   . Diverticulosis   . GERD (gastroesophageal reflux disease)   . Gout   . H/O cardiomyopathy    ischemic, now last echo 07/30/11, EF 38%W  . History of hiatal hernia   . Hyperlipidemia   . Hypertension   . Internal hemorrhoids   . Left hemiplegia ([place])   . NSTEMI (non-ST elevated myocardial infarction) ([place])    NSTEMI- last cath 06/2011-Stent to LCX-DES, last nuc 07/30/11 low risk  . Obesity (BMI 30-39.9)   . OSA on CPAP    since July 2013- uses CPAP sometimes , states he has lost 147 lbs. since stroke & doesn't use the CPAP as much as he use to.   . Plantar fasciitis of left foot   . Pneumonia    hosp. 12/2014  . Pulmonary embolism ([place]) 03/2014  . Stroke ([place]) 01/2014   "no control of LUE, can slightly LLE; memory problems since" (11/11/2014)  . Tubular  adenoma of colon     Past Surgical History:  Procedure Laterality Date  . BASAL CELL CARCINOMA EXCISION Left 2015   ankle  . CARDIAC SURGERY    . CATARACT EXTRACTION Bilateral   . COLONOSCOPY N/A 02/10/2013   Procedure: COLONOSCOPY;  Surgeon: Ladene Artist, MD;  Location: [facility];  Service: Endoscopy;  Laterality: N/A;  . CORONARY ANGIOPLASTY WITH STENT PLACEMENT  07/01/2011   DES-Resolute to native LCX  . CORONARY ANGIOPLASTY WITH STENT PLACEMENT  12/01/2007   COMPLEX 5 LESION PCI INCLUDING CUTTING BALLOON AND 4 CYPHER DESs  . CORONARY ARTERY BYPASS GRAFT  07/20/2010    LIMA-LAD; VG-ACUTE MARG of RCA; Seq VG-distal RCA & then pda  . EP IMPLANTABLE DEVICE N/A 02/14/2016   Procedure: Loop Recorder Removal;  Surgeon: Thompson Grayer, MD;  Location: [facility] LAB;  Service: Cardiovascular;  Laterality: N/A;  . ESOPHAGOGASTRODUODENOSCOPY  01/2014   gastric ulcer, erosive gastroduodenitis, H Pylori negative.   . ESOPHAGOGASTRODUODENOSCOPY (EGD) WITH PROPOFOL N/A 11/14/2014   Procedure: ESOPHAGOGASTRODUODENOSCOPY (EGD) WITH PROPOFOL;  Surgeon: Milus Banister, MD;  Location: [facility];  Service: Endoscopy;  Laterality: N/A;  . HAND SURGERY Right  to take glass out  . HERNIA REPAIR    . INTRAMEDULLARY (IM) NAIL INTERTROCHANTERIC Right 02/07/2015   Procedure: INTRAMEDULLARY (IM) NAIL INTERTROCHANTRIC RIGHT HIP;  Surgeon: Renette Butters, MD;  Location: [facility];  Service: Orthopedics;  Laterality: Right;  . KNEE ARTHROSCOPY Right   . LEFT HEART CATHETERIZATION WITH CORONARY/GRAFT ANGIOGRAM  07/01/2011   Procedure: LEFT HEART CATHETERIZATION WITH Beatrix Fetters;  Surgeon: Sanda Klein, MD;  Location: [facility] CATH LAB;  Service: Cardiovascular;;  . LOOP RECORDER IMPLANT  02/15/2014   MDT LINQ implanted by Dr Rayann Heman for cryptogenic stroke  . PERCUTANEOUS CORONARY STENT INTERVENTION (PCI-S) Right 07/01/2011   Procedure: PERCUTANEOUS CORONARY STENT INTERVENTION (PCI-S);  Surgeon:  Sanda Klein, MD;  Location: [facility] CATH LAB;  Service: Cardiovascular;  Laterality: Right;  . PICC LINE PLACE PERIPHERAL ([place] HX)  06/2015  . RETINAL DETACHMENT SURGERY Right   . TEE WITHOUT CARDIOVERSION N/A 02/15/2014   Procedure: TRANSESOPHAGEAL ECHOCARDIOGRAM (TEE);  Surgeon: Josue Hector, MD;  Location: [facility] ENDOSCOPY;  Service: Cardiovascular;  Laterality: N/A;  . UMBILICAL HERNIA REPAIR  2006    There were no vitals filed for this visit.  Subjective Assessment - 08/05/19 1104    Subjective  Reports he has not been able to sleep for past two nights, not sure why. Got 2.5 hours last night per is sleep PAP machine. No falls or pain to report.    Patient is accompained by:  Family member   spouse   Pertinent History  DNR; anxiety & depression, CAD with CABG, diverticulosis, gout, HLD, HTN, left hemiplegia, NSTEMI (2013), morbid obesity, PE (2016), CVA (2015), tubular adenoma of colon, OSA w/CPAP, right hip fx w/ORIF (2016), iron deficiency anemia    Limitations  Standing;Walking    Patient Stated Goals  to obtain a Bioness for home use; to return to PLOF of ambulating household distances and short community distances with quad cane    Currently in Pain?  No/denies    Pain Score  0-No pain            [facility] Adult PT Treatment/Exercise - 08/05/19 1106      Transfers   Transfers  Sit to Stand;Stand to Sit    Sit to Stand  4: Min guard;With upper extremity assist;From chair/3-in-1    Stand to Sit  4: Min guard;With upper extremity assist;To chair/3-in-1    Comments  x 4 reps to cane, x 4 reps to parallel bars      Ambulation/Gait   Ambulation/Gait  Yes    Ambulation/Gait Assistance  4: Min assist    Ambulation/Gait Assistance Details  cues on posture. assist needed for increased left step length and step placement with gait.     Ambulation Distance (Feet)  20 Feet   x1, 10 x1   Assistive device  Small based quad cane;Other (Comment)   Bioness to left LE   Gait Pattern  Step-to  pattern;Decreased step length - left;Decreased stance time - left;Decreased hip/knee flexion - left;Decreased dorsiflexion - left;Poor foot clearance - left;Scissoring    Ambulation Surface  Level;Indoor      Exercises   Exercises  Other Exercises    Other Exercises   concurrent with Bioness to left LE: on rockerboard for AA DF with on time, rest with off time for 15 reps; yellow band resisted DF with on time, rest with off time for 10 reps, minimal range noted; standing at parallel bars- mini squats for 2 sets of 6 reps with seated rest between  sets, then fwd foot tap to bottom of parallel bars/back to floor for 2 sets of 5 reps, seated rest between. assist needed to shift onto left LE with mini squats and to lift left LE with foot taps.       Acupuncturist Location  L anterior tibialis and L quad muscles    Electrical Stimulation Action  for increased muscle activation with ex's and gait, for increased strengthening/decreased edema    Electrical Stimulation Parameters  refer to tablet 2 for adjusted parameters; small round cloth electrodes    Electrical Stimulation Goals  Strength;Tone;Edema;Neuromuscular facilitation          PT Short Term Goals - 08/05/19 1630      PT SHORT TERM GOAL #1   Title  Patient will be able to perform 2-3 exercises in standing for HEP  (RESET STG AFTER 30 DAYS)    Baseline  08/05/19: met is session today    Status  Achieved    Target Date  08/05/19      PT SHORT TERM GOAL #2   Title  Pt will be able to perform full TUG with cane    Baseline  1/2 TUG    Time  4    Period  Weeks    Status  On-going    Target Date  08/05/19      PT SHORT TERM GOAL #3   Title  Pt tolerates use of Bioness unit for transfer and gait training with quad cane and min A    Baseline  08/05/19: met in session today    Time  --    Period  --    Status  Achieved    Target Date  08/05/19      PT SHORT TERM GOAL #4   Title  Pt will improve gait  velocity (over 10 foot distance) to >/= .5 ft/sec with cane and new AFO    Baseline  .17 ft/sec, no AFO with cane    Time  4    Period  Weeks    Status  On-going    Target Date  08/05/19      PT SHORT TERM GOAL #5   Title  Pt will consistently perform stand pivot transfers to L and R with cane and AFO with min A    Baseline  08/05/19: pt does not have AFO as of yet. Has been casted, waiting on delivery. does perform this with Bioness.    Time  --    Period  --    Status  Partially Met    Target Date  08/05/19        PT Long Term Goals - 07/06/19 1527      PT LONG TERM GOAL #1   Title  Patient and wife demonstrate and verbalizes independence with final HEP    Baseline  difficulty finding exercises for pt to do at home, sits and sleeps in recliner; no access to bed    Time  12    Period  Weeks    Status  Revised    Target Date  10/04/19      PT LONG TERM GOAL #2   Title  Pt will improve TUG with cane and Bioness/AFO to </= 60 seconds    Baseline  TBD for full TUG    Time  12    Period  Weeks    Status  Revised    Target Date  10/04/19  PT LONG TERM GOAL #3   Title  Patient will improve gait velocity over full distance with quad cane and most appropriate orthosis (bioness or AFO) to >0.8 ft/sec    Time  12    Period  Weeks    Status  Revised    Target Date  10/04/19      PT LONG TERM GOAL #4   Title  Patient demonstrates improved L passive ankle DF by at least 10 degrees.    Baseline  lacking 24 deg with knee extended, lacking 10 deg with knee flexed > Lacking 20 deg with knee extended, lacking 16 deg with knee flexed    Time  12    Period  Weeks    Status  Revised    Target Date  10/04/19      PT LONG TERM GOAL #5   Title  Pt will demonstrate ability to transfer w/c <> mat/chair with quad cane, most appropriate orthosis and supervision    Baseline  min-mod A with quad cane    Time  12    Period  Weeks    Status  Revised    Target Date  10/04/19             Plan - 08/05/19 1105    Clinical Impression Statement  Today's skilled session continued with use of Bioness to left LE with ex's and gait. Changed back to small round cloth electrodes as hydrogel electrodes did not make a difference. Continue to have limited to no ant tib muscle contraction with Bioness. Pt has some active movement which appears to be assisted by Bioness with gait.The pt is progressing toward goals and should benefit from continued PT    Personal Factors and Comorbidities  Age;Fitness;Past/Current Experience;Comorbidity 3+;Time since onset of injury/illness/exacerbation    Comorbidities  anixety & depression, CAD, diverticulosis, gout, HLD, HTN, left hemiplegia, NSTEMI (2013), morbid obesity, PE (2016), CVA (2015), tubular adenoma of colon, OSA w/CPAP, right hip fx w/ORIF (2016), iron deficiency anemia    Examination-Activity Limitations  Squat;Stairs;Stand;Dressing;Transfers;Bed Mobility;Locomotion Level    Examination-Participation Restrictions  Church;Community Activity    Stability/Clinical Decision Making  Evolving/Moderate complexity    Rehab Potential  Good    PT Frequency  2x / week    PT Duration  12 weeks    PT Treatment/Interventions  ADLs/Self Care Home Management;Electrical Stimulation;DME Instruction;Balance training;Therapeutic exercise;Orthotic Fit/Training;Gait training;Neuromuscular re-education;Stair training;Functional mobility training;Patient/family education;Therapeutic activities;Manual techniques;Compression bandaging;Passive range of motion    PT Next Visit Plan  Check remaining STGs. work on standing balance and tolerance; left LE stretching and exercises with gravity minimized; Bioness in training mode for ex's and with gait with cane.    Consulted and Agree with Plan of Care  Patient;Family member/caregiver    Family Member Consulted  spouse       Patient will benefit from skilled therapeutic intervention in order to improve the  following deficits and impairments:  Abnormal gait, Decreased range of motion, Difficulty walking, Impaired tone, Obesity, Impaired UE functional use, Decreased endurance, Decreased activity tolerance, Decreased skin integrity, Decreased balance, Decreased knowledge of use of DME, Impaired flexibility, Postural dysfunction, Impaired sensation, Increased edema, Decreased strength, Decreased mobility  Visit Diagnosis: Muscle weakness (generalized)  Foot drop, left  Unsteadiness on feet  Spastic hemiplegia of left nondominant side as late effect of cerebral infarction (HCC)  Other abnormalities of gait and mobility  Stiffness of left ankle, not elsewhere classified  Repeated falls  Abnormal posture     Problem List  Patient Active Problem List   Diagnosis Date Noted  . Left foot drop 07/14/2019  . GERD (gastroesophageal reflux disease) 06/18/2019  . Tinea cruris 07/20/2017  . Hemorrhoid 07/20/2017  . Irritability 06/08/2017  . Fall at home 05/06/2017  . Advance care planning 11/28/2016  . Skin mass 11/28/2016  . Lower urinary tract symptoms (LUTS) 09/05/2016  . Hearing loss 09/05/2016  . Excessive daytime sleepiness 04/16/2016  . Exposure to TB 02/21/2016  . Vitamin D deficiency 02/21/2016  . Anemia 11/10/2015  . Left thigh pain 11/10/2015  . Neck pain 10/12/2015  . Hemiplegia affecting left side in left-dominant patient as late effect of cerebrovascular disease ([place]) 09/03/2015  . Medicare annual wellness visit, subsequent 08/14/2015  . Acute pulmonary embolism ([place]) 07/25/2015  . Chest wall pain 07/15/2015  . History of pulmonary embolism 07/15/2015  . Chronic anticoagulation 05/22/2015  . Gait disturbance 05/15/2015  . DNR (do not resuscitate) 01/19/2015  . Hypotension 01/14/2015  . Paresthesia   . Depression 12/14/2014  . OSA (obstructive sleep apnea) 11/30/2014  . Chronic combined systolic and diastolic congestive heart failure ([place]) 11/11/2014  . SOB (shortness  of breath) 04/17/2014  . Hemiplegia affecting left dominant side ([place]) 02/16/2014  . 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 06/30/2011    Willow Ora, PTA, [facility] Outpatient Neuro [facility] [address], [place], [place] [address] [phone] 08/06/19, 9:34 AM   Name: Gregory Wiley MRN: [id number] Date of Birth: 09/14/1950

## 2019-08-05 NOTE — Telephone Encounter (Signed)
Faxed signed order back to [facility] in regards to AFO brace at [phone]. Received fax confirmation.

## 2019-08-10 ENCOUNTER — Telehealth: Payer: Self-pay | Admitting: *Deleted

## 2019-08-10 NOTE — Telephone Encounter (Signed)
Faxed signed order for Myocycle FES cycling therapy system+office notes back to [facility] at [phone]. Received fax confirmation.

## 2019-08-11 ENCOUNTER — Ambulatory Visit: Payer: Medicare Other | Admitting: Physical Therapy

## 2019-08-12 ENCOUNTER — Encounter: Payer: Self-pay | Admitting: Cardiovascular Disease

## 2019-08-12 ENCOUNTER — Telehealth (INDEPENDENT_AMBULATORY_CARE_PROVIDER_SITE_OTHER): Payer: Medicare Other | Admitting: Cardiovascular Disease

## 2019-08-12 DIAGNOSIS — E785 Hyperlipidemia, unspecified: Secondary | ICD-10-CM

## 2019-08-12 DIAGNOSIS — Z7901 Long term (current) use of anticoagulants: Secondary | ICD-10-CM

## 2019-08-12 DIAGNOSIS — I5042 Chronic combined systolic (congestive) and diastolic (congestive) heart failure: Secondary | ICD-10-CM | POA: Diagnosis not present

## 2019-08-12 DIAGNOSIS — I255 Ischemic cardiomyopathy: Secondary | ICD-10-CM

## 2019-08-12 DIAGNOSIS — I25810 Atherosclerosis of coronary artery bypass graft(s) without angina pectoris: Secondary | ICD-10-CM | POA: Diagnosis not present

## 2019-08-12 DIAGNOSIS — G4733 Obstructive sleep apnea (adult) (pediatric): Secondary | ICD-10-CM

## 2019-08-12 DIAGNOSIS — Z951 Presence of aortocoronary bypass graft: Secondary | ICD-10-CM | POA: Diagnosis not present

## 2019-08-12 DIAGNOSIS — I480 Paroxysmal atrial fibrillation: Secondary | ICD-10-CM

## 2019-08-12 NOTE — Patient Instructions (Signed)
Medication Instructions:  CONTINUE WITH CURRENT MEDICATIONS. NO CHANGES.  *If you need a refill on your cardiac medications before your next appointment, please call your pharmacy*   Testing/Procedures: in 6 months Your physician has requested that you have an echocardiogram. Echocardiography is a painless test that uses sound waves to create images of your heart. It provides your doctor with information about the size and shape of your heart and how well your heart's chambers and valves are working. This procedure takes approximately one hour. There are no restrictions for this procedure.  [place]   Follow-Up: At [facility], you and your health needs are our priority.  As part of our continuing mission to provide you with exceptional heart care, we have created designated Provider Care Teams.  These Care Teams include your primary Cardiologist (physician) and Advanced Practice Providers (APPs -  Physician Assistants and Nurse Practitioners) who all work together to provide you with the care you need, when you need it.  We recommend signing up for the patient portal called "MyChart".  Sign up information is provided on this After Visit Summary.  MyChart is used to connect with patients for Virtual Visits (Telemedicine).  Patients are able to view lab/test results, encounter notes, upcoming appointments, etc.  Non-urgent messages can be sent to your provider as well.   To learn more about what you can do with MyChart, go to [email or web address].    Your next appointment:   6 month(s)  The format for your next appointment:   In Person  Provider:   Shelva Majestic, MD

## 2019-08-12 NOTE — Progress Notes (Signed)
Virtual Visit via Video Note   This visit type was conducted due to national recommendations for restrictions regarding the COVID-19 Pandemic (e.g. social distancing) in an effort to limit this patient's exposure and mitigate transmission in our community.  Due to his co-morbid illnesses, this patient is at least at moderate risk for complications without adequate follow up.  This format is felt to be most appropriate for this patient at this time.  All issues noted in this document were discussed and addressed.  A limited physical exam was performed with this format.  Please refer to the patient's chart for his consent to telehealth for [facility].   The patient was identified using 2 identifiers.  Date:  08/12/2019   ID:  Gregory Wiley, DOB May 28, 1950, MRN [id number]  Patient Location: Home Provider Location: Home  PCP:  Tonia Ghent, MD  Cardiologist:  Shelva Majestic, MD Electrophysiologist:  None   Evaluation Performed:  Follow-Up Visit  Chief Complaint:  6 month F/U  History of Present Illness:    Gregory Wiley is a 69 y.o. male with  has established CAD who underwent stenting of his RCA in May 2011. He is s/p CABG surgery with LIMA to the LAD, sequential vein to the distal RCA and PDA, and vein to the acute marginal branch of his right coronary artery. In April 2013 he developed unstable angina and ruled in for non-ST segment elevation myocardial infarction. Catheterization revealed 99% stenosis of the large left circumflex vessel and he underwent successful stenting with insertion of a 4.0x18 mm resolute DES stent post dilated to 4.5 mm.  Additional problems include obstructive sleep apnea. On his initial polysomnogram in 2010 AHI was 11.3 overall, but during REM sleep he had moderate sleep apnea with an AHI of 24.8. He never did initiate CPAP therapy at that time. However, since his myocardial infarction he has been using CPAP therapy since July 2013.  When I last saw him,  he was only utilizing CPAP intermittently.  He now admits to much more frequent use but there are times when he does not use treatment.  He has a history of hyperlipidemia, hypertension, and morbid obesity. An echo Doppler study in May 2013 suggested an ejection fraction of 38% and on nuclear imaging  was 37% with scar in the LAD territory.  He was admitted to [facility] hospital in November 2015 with a large right MCA territory CVA.  TEE was negative for embolic source.  EF was 45% on echocardiogram.  He underwent implantable loop recorder by Dr. Rayann Heman. He was readmitted with bilateral submassive pulmonary embolism.  Venous Dopplers were positive for DVT.  Ejection fraction was 25-30%, which is felt to be contributed by strain from his pulmonary emboli.  He had mild troponin elevation which was felt to be due to demand ischemia and has been on Xarelto for anticoagulation.  He was admitted on the [facility] hospitalist service on 11/11/2014 with dehydration.  Several of his medications were held.  I reviewed his hospital record.  He had a history of nausea and vomiting that had been going on for approximatey a month.  He has continued to feel weak and in his words, "feels terrible" since his hospitalization.  He has had issues with headaches which he did see neurology.  He has not been eating well.  He seeing [facility] Damita Dunnings at [facility] for primary care.  He denies chest pressure.  He denies fevers.  He is on Xarelto anticoagulation and denies  overt bleeding.  There is a remote history of erosive gastroduodenitis, GERD, esophageal stricture for which in the past he had been seen by Dr. Fuller Plan.  When I previously saw him, he was tachycardic which I felt was contributed by his Ritalin.  He has since been off this medication.   In November 2017, he had removal of his loop recorder by Dr. Rayann Heman.  This had been initially placed at that in 2015 following his large right middle cerebral artery stroke.  Over the last  several years he admits to a purposeful 170 pound weight loss with a peak weight at 346 down to 193.  His initial sleep study was in September 2010.  When I last saw him in September 2018.  He complained of significant fatigability.  An Epworth Sleepiness Scale score was calculated and this endorsed at 15 which is consistent with excessive daytime sleepiness.  He also has noticed lower extremity edema.    He underwent a sleep study on 12/14/2016.  He was found to have severe sleep apnea with an AHI of 36.1 and RDI 40.4.  CPAP was started at 5 cm and was titrated up to 12 cm.  Oxygen desaturation was 87%.  He was unable to achieve any REM sleep on the diagnostic portion of the study.  He was set up with CPAP on 02/28/2017.  DME's [facility].  Hollace Kinnier was obtained from January 24 through separate 22 2019.  He is compliant with 97% of usage stays and 87% of usage greater than 4 hours.  He is averaging 7 hours and 33 minutes of sleep per night.  At 11 cm water pressure.  AHI is 1.2.  He does have moderate leak almost on a daily basis.  He has been using nasal pillows, but upon further questioning, there is significant oral venting and mouth breathing.  Epworth Sleepiness Scale score endorsed at 17 today, which is compatible with residual daytime sleepiness.   I saw him in February 2019.  At that time, I reviewed his most recent sleep study in detail.  Over the past year, he has been followed by Dr. [name].  He had issues with iron deficiency anemia.  He denies any episodes of chest pain.  He continues to use CPAP with excellent compliance.  He admits to a 40 pound weight gain over the past year.  Not been very active and has been eating more.  Download was obtained in the office today of his CPAP unit from April 25, 2018 through May 24, 2018.  Usage days was 97% with usage greater than 4 hours 90%.  Average usage was 7 hours and 53 minutes.  At a 11 cm set pressure, AHI is excellent at 1.6.   There are several days of increased leak of his mask.  [facility] is his DME company.    During that evaluation,I recommended that he undergo a 5-year follow-up nuclear perfusion study for reassessment of his coronary perfusion. In addition, repeat laboratory was recommended on his current medical regimen.  He underwent his nuclear perfusion study on May 29, 2018. This revealed it is for prior left scar most likely involving the circumflex territory soon with his prior MI without associated ischemia. Nuclear EF was 34%.   He was  evaluated in a telemedicine visit on August 17, 2018.   He denied any recurrent chest pain or anginal type symptoms. He was using CPAP with excellent compliance. A new download was obtained in the office  from March 21 through July 05, 2018 which verified compliance with 97% of usage days; averaging 7 hours and 53 minutes of CPAP use. At 11 cm water pressure AHI was excellent at 1.6. Apparently his machine was malfunctioning and is not able to generate data to allow additional reporting. His DME company is [facility]. He typically goes to bed between 9 and 10 PM but often times may wake up at 330 to go to the bathroom. Rather than going back to sleep he often was going to a chair and to watch television.  He admitted to daytime sleepiness.  During that evaluation I stressed the importance of using CPAP throughout the nights entirety since the second half of the night he often was not using treatment.  I discussed the preponderance of REM sleep occurs in the second half of the night and if he has reduced sleep duration this may be a contributor to his residual daytime sleepiness.  He continues to be on Nuvigil.  I last saw him in November 2020.  Since his last evaluation, he received a new ResMed air sense 10 CPAP machine several months ago.  A new download was obtained in the office today from October 25 through February 08, 2019.  He is 100%  compliant and is now back at using CPAP for 7 hours per night.  At 11 cm water pressure, AHI is 92.3/h.  He does have occasional mask leak.    He remains debilitated from his prior stroke and does admit to some dizziness with walking or standing.  In the past, I had started him on midodrine 2.5 mg twice a day for orthostatic symptomatology.  Apparently, when he he was seen by neurology, he was advised to increase that to 5 mg twice a day.  The patient states that with this increase his blood pressure became significantly elevated and therefore he stopped the medication.   Since I last saw him, he states that he remains incapacitated from his prior stroke.  He is sitting down most of his time.  2 times per week he has physical therapy.  He has difficulty with foot drop from the stroke.  He believes most of the time he feels that he is in "brain fog."  He has continued to use CPAP.  I obtained a new download from April 27 to Aug 11, 2019 which confirms 100% compliance.  Average usage is 7 hours and 32 minutes at 11 cm water pressure AHI is excellent at 1.9.  Presently he denies chest pain or shortness of breath.  He is trying to do some exercise with supine bicycle.  His last echo Doppler study was in 2017 which showed an EF of 40% with grade 2 diastolic dysfunction.  He presents for a video telemedicine evaluation.  The patient does not have symptoms concerning for COVID-19 infection (fever, chills, cough, or new shortness of breath).    Past Medical History:  Diagnosis Date  . Adrenal insufficiency ([place])   . Anxiety   . Basal cell carcinoma of skin of left ankle   . Bleeding stomach ulcer 2015  . Coronary artery disease    stent, then CABG 07/20/10  . Daily headache    "for the past month" (11/11/2014)  . Depression   . Diverticulosis   . GERD (gastroesophageal reflux disease)   . Gout   . H/O cardiomyopathy    ischemic, now last echo 07/30/11, EF 38%W  . History of hiatal hernia   .  Hyperlipidemia   . Hypertension   . Internal hemorrhoids   . Left hemiplegia ([place])   . NSTEMI (non-ST elevated myocardial infarction) ([place])    NSTEMI- last cath 06/2011-Stent to LCX-DES, last nuc 07/30/11 low risk  . Obesity (BMI 30-39.9)   . OSA on CPAP    since July 2013- uses CPAP sometimes , states he has lost 147 lbs. since stroke & doesn't use the CPAP as much as he use to.   . Plantar fasciitis of left foot   . Pneumonia    hosp. 12/2014  . Pulmonary embolism ([place]) 03/2014  . Stroke ([place]) 01/2014   "no control of LUE, can slightly LLE; memory problems since" (11/11/2014)  . Tubular adenoma of colon    Past Surgical History:  Procedure Laterality Date  . BASAL CELL CARCINOMA EXCISION Left 2015   ankle  . CARDIAC SURGERY    . CATARACT EXTRACTION Bilateral   . COLONOSCOPY N/A 02/10/2013   Procedure: COLONOSCOPY;  Surgeon: Ladene Artist, MD;  Location: [facility] END[facility]COPY;  Service: Endoscopy;  Laterality: N/A;  . CORONARY ANGIOPLASTY WITH STENT PLACEMENT  07/01/2011   DES-Resolute to native LCX  . CORONARY ANGIOPLASTY WITH STENT PLACEMENT  12/01/2007   COMPLEX 5 LESION PCI INCLUDING CUTTING BALLOON AND 4 CYPHER DESs  . CORONARY ARTERY BYPASS GRAFT  07/20/2010    LIMA-LAD; VG-ACUTE MARG of RCA; Seq VG-distal RCA & then pda  . EP IMPLANTABLE DEVICE N/A 02/14/2016   Procedure: Loop Recorder Removal;  Surgeon: Thompson Grayer, MD;  Location: [place] CV LAB;  Service: Cardiovascular;  Laterality: N/A;  . ESOPHAGOGASTRODUODENOSCOPY  01/2014   gastric ulcer, erosive gastroduodenitis, H Pylori negative.   . ESOPHAGOGASTRODUODENOSCOPY (EGD) WITH PROPOFOL N/A 11/14/2014   Procedure: ESOPHAGOGASTRODUODENOSCOPY (EGD) WITH PROPOFOL;  Surgeon: Milus Banister, MD;  Location: [place];  Service: Endoscopy;  Laterality: N/A;  . HAND SURGERY Right    to take glass out  . HERNIA REPAIR    . INTRAMEDULLARY (IM) NAIL INTERTROCHANTERIC Right 02/07/2015   Procedure: INTRAMEDULLARY (IM) NAIL  INTERTROCHANTRIC RIGHT HIP;  Surgeon: Renette Butters, MD;  Location: [place];  Service: Orthopedics;  Laterality: Right;  . KNEE ARTHROSCOPY Right   . LEFT HEART CATHETERIZATION WITH CORONARY/GRAFT ANGIOGRAM  07/01/2011   Procedure: LEFT HEART CATHETERIZATION WITH Beatrix Fetters;  Surgeon: Sanda Klein, MD;  Location: [place] CATH LAB;  Service: Cardiovascular;;  . LOOP RECORDER IMPLANT  02/15/2014   MDT LINQ implanted by Dr Rayann Heman for cryptogenic stroke  . PERCUTANEOUS CORONARY STENT INTERVENTION (PCI-S) Right 07/01/2011   Procedure: PERCUTANEOUS CORONARY STENT INTERVENTION (PCI-S);  Surgeon: Sanda Klein, MD;  Location: [facility] CATH LAB;  Service: Cardiovascular;  Laterality: Right;  . PICC LINE PLACE PERIPHERAL ([place] HX)  06/2015  . RETINAL DETACHMENT SURGERY Right   . TEE WITHOUT CARDIOVERSION N/A 02/15/2014   Procedure: TRANSESOPHAGEAL ECHOCARDIOGRAM (TEE);  Surgeon: Josue Hector, MD;  Location: [place];  Service: Cardiovascular;  Laterality: N/A;  . UMBILICAL HERNIA REPAIR  2006     Current Meds  Medication Sig  . acetaminophen (TYLENOL) 325 MG tablet Take 1 tablet (325 mg total) by mouth every 6 (six) hours as needed for mild pain.  Marland Kitchen albuterol (PROVENTIL HFA;VENTOLIN HFA) 108 (90 Base) MCG/ACT inhaler Inhale 2 puffs into the lungs every 6 (six) hours as needed for wheezing or shortness of breath.  Marland Kitchen apixaban (ELIQUIS) 5 MG TABS tablet Take 1 tablet (5 mg total) by mouth 2 (two) times daily.  . Armodafinil 200 MG  TABS TAKE 1 TABLET BY MOUTH EVERY MORNING  . cholecalciferol (VITAMIN D) 1000 units tablet Take 1,000 Units by mouth daily.  . [employer]S IRON 325 (65 Fe) MG tablet TAKE 1 TABLET BY MOUTH TWICE A DAY WITH A MEAL  . doxepin (SINEQUAN) 10 MG capsule TAKE 1 CAPSULE AT BEDTIME  . ELDERBERRY PO Take by mouth. 3 a day  . fluticasone (FLONASE) 50 MCG/ACT nasal spray Place 2 sprays into both nostrils daily. (Patient taking differently: Place 2 sprays into both nostrils daily as  needed. )  . gabapentin (NEURONTIN) 300 MG capsule Take one or two at bedtime  . HYDROcodone-acetaminophen (NORCO/VICODIN) 5-325 MG tablet Take 1 tablet by mouth daily as needed for moderate pain.  . hydrocortisone (ANUSOL-HC) 25 MG suppository Place 1 suppository (25 mg total) rectally 2 (two) times daily as needed. X 1 week; then, use as needed thereafter.  Marland Kitchen KLOR-CON M10 10 MEQ tablet TAKE 1 TABLETS BY MOUTH 2 TIMES DAILY.  Marland Kitchen lansoprazole (PREVACID) 30 MG capsule Take 1 capsule (30 mg total) by mouth 2 (two) times daily before a meal.  . Melatonin 10 MG CAPS TAKE 1 CAPSULE BY MOUTH AT BEDTIME AS NEEDED FOR SLEEP  . midodrine (PROAMATINE) 2.5 MG tablet Take 1 tablet (2.5 mg total) by mouth 2 (two) times daily with a meal.  . nitroGLYCERIN (NITROSTAT) 0.4 MG SL tablet Place 1 tablet (0.4 mg total) under the tongue every 5 (five) minutes as needed for chest pain.  Marland Kitchen nystatin (MYCOSTATIN/NYSTOP) powder Apply topically 2 (two) times daily.  Marland Kitchen OVER THE COUNTER MEDICATION Ceylon Cinnamon- takes 2 a day, 1200 mg  . potassium chloride (K-DUR) 10 MEQ tablet TAKE 1 TABLETS BY MOUTH 2 TIMES DAILY.  Marland Kitchen PRESCRIPTION MEDICATION Inhale into the lungs at bedtime. CPAP  . prochlorperazine (COMPAZINE) 10 MG tablet Take 1 tablet (10 mg total) by mouth every 8 (eight) hours as needed (headache, nausea or vomiting).  . QC LO-DOSE ASPIRIN 81 MG EC tablet TAKE 1 TABLET (81 MG TOTAL) BY MOUTH DAILY. SWALLOW WHOLE.  . QC STOOL SOFTENER PLS LAXATIVE 8.6-50 MG tablet TAKE 1 TABLET BY MOUTH 2 TIMES DAILY. (Patient taking differently: Take 1 tablet by mouth as needed. )  . rosuvastatin (CRESTOR) 40 MG tablet Take 1 tablet (40 mg total) by mouth daily.  Marland Kitchen senna (SENOKOT) 8.6 MG tablet Take 1 tablet by mouth 2 (two) times daily.  . sertraline (ZOLOFT) 100 MG tablet TAKE 1 TABLET BY MOUTH EVERY DAY  . tiZANidine (ZANAFLEX) 4 MG capsule Take 1 capsule (4 mg total) by mouth as needed.     Allergies:   Diphenhydramine hcl,  Adhesive [tape], Proscar [finasteride], and Tamsulosin   Social History   Tobacco Use  . Smoking status: Former Smoker    Packs/day: 2.00    Years: 4.00    Pack years: 8.00    Types: Cigarettes    Quit date: 01/30/1969    Years since quitting: 50.5  . Smokeless tobacco: Never Used  . Tobacco comment: "quit smoking cigarettes  in the 1970's"  Substance Use Topics  . Alcohol use: No    Alcohol/week: 0.0 standard drinks  . Drug use: No    Comment: "smoked some pot in college"     Family Hx: The patient's family history includes Brain cancer in his maternal grandmother; CAD (age of onset: 62) in his father; COPD in his paternal grandfather; Coronary artery disease in an other family member; Diabetes in his brother; Diabetes type II in  his mother; Heart attack (age of onset: 33) in his father; Hyperlipidemia in his brother; Hypertension in his brother; Other in an other family member. There is no history of Colon cancer, Stroke, or Prostate cancer.  ROS:   Please see the history of present illness.    Debilitated from his prior stroke No chest pain No shortness of breath Foot drop "Brain fog " No palpitations Sleeping well with CPAP with 100% compliance All other systems reviewed and are negative.   Prior CV studies:   The following studies were reviewed today:  I reviewed his echo Doppler study from 2017. A new download regarding CPAP use from April 27 through May 26 was obtained and reviewed  Labs/Other Tests and Data Reviewed:    EKG:  An ECG dated 02/10/2019 was personally reviewed today and demonstrated:  Normal sinus rhythm at 65 bpm, left axis deviation, LVH with repolarization changes, mild RV conduction delay.  Anterior Q waves V1, and V3 to V6  March 2020 ECG (independently read by me): Normal sinus rhythm at 64 bpm.  Left axis deviation.  LVH with repolarization changes.  QS complex V1 through V4 QTc interval 468 ms.  12/04/2016 ECG (independently read by me):  Normal sinus rhythm at 74 bpm.  QRS complex V1 through V3.  Previously noted lateral ST changes.  Normal intervals.  March 2017 ECG (independently read by me):  Normal sinus rhythm at 61 bpm. Poor R wave progression. Lateral ST segment changes.  QTc interval 469 ms  September 2016 ECG (independently read by me): Sinus tachycardia 1 18 bpm.  Left axis deviation.  Cannot rule out old anterolateral infarct.  ECG (independently read by me): Normal sinus rhythm at 93 bpm.  QS complex anteriorly compatible with old anterior MI  April 2016 ECG (independently read by me): Normal sinus rhythm at 79 bpm.  Old anteroseptal MI with QS complex V1 through V3.  LVH by voltage criteria in aVL.  Increased QTc interval 488 ms.   Recent Labs: 06/17/2019: ALT 16; BUN 17; Creat 1.03; Hemoglobin 15.1; Platelets 144; Potassium 4.8; Sodium 142   Recent Lipid Panel Lab Results  Component Value Date/Time   CHOL 134 06/17/2019 02:45 PM   CHOL 134 06/02/2018 12:00 AM   CHOL 199 01/04/2013 12:06 PM   TRIG 82 06/17/2019 02:45 PM   TRIG 113 01/04/2013 12:06 PM   HDL 46 06/17/2019 02:45 PM   HDL 57 06/02/2018 12:00 AM   HDL 47 01/04/2013 12:06 PM   CHOLHDL 2.9 06/17/2019 02:45 PM   LDLCALC 72 06/17/2019 02:45 PM   LDLCALC 69 10/10/2017 12:00 AM    Wt Readings from Last 3 Encounters:  08/12/19 240 lb (108.9 kg)  07/14/19 258 lb 8 oz (117.3 kg)  01/12/19 253 lb (114.8 kg)     Objective:    Vital Signs:  Ht 6' (1.829 m)   Wt 240 lb (108.9 kg)   BMI 32.55 kg/m    This was a video telemedicine visit. Physical appearance was unchanged He is bearded Voice is faint No wheezing Breathing was nonlabored No awareness of palpitations No abdominal pain Foot drop   ASSESSMENT & PLAN:    1. CAD: Status post stenting of RCA in May 2011 with subsequent CABG revascularization surgery Jul 20, 2010.  April 2013 he was found to have subtotal stenosis of the very large circumflex vessel underwent DES stenting.   Nuclear perfusion study May 2013 showed EF 37%. 2. CVA: Large right middle cerebral CVA.  Residual foot drop and debility. 3. OSA: Continues to use CPAP with 1% compliance.  Every few download today which shows 100% use, AHI 1.4 at 11 cm water pressure. 4. Hyperlipidemia: Currently on rosuvastatin 40 mg with most recent LDL 72 in April 2021. 5. Ischemic cardiomyopathy: Last echo study April 2017 showed EF 40% with grade 2 diastolic dysfunction.  Recommend follow-up echo Doppler study for reassessment. 6. History PAF: On chronic anticoagulation 7. History of orthostatic hypotension: Continues to be on low-dose midodrine at 2.5 mg twice a day.  COVID-19 Education: The signs and symptoms of COVID-19 were discussed with the patient and how to seek care for testing (follow up with PCP or arrange E-visit).  The importance of social distancing was discussed today.  Time:   Today, I have spent 39minutes with the patient with telehealth technology discussing the above problems.     Medication Adjustments/Labs and Tests Ordered: Current medicines are reviewed at length with the patient today.  Concerns regarding medicines are outlined above.   Tests Ordered: No orders of the defined types were placed in this encounter.   Medication Changes: No orders of the defined types were placed in this encounter.   Follow Up: Plan follow-up echo Doppler study in 6 months with office visit follow-up.  Signed, Shelva Majestic, MD  08/12/2019 9:02 AM    [place]

## 2019-08-13 ENCOUNTER — Encounter: Payer: Self-pay | Admitting: Physical Therapy

## 2019-08-13 ENCOUNTER — Telehealth: Payer: Self-pay | Admitting: Cardiovascular Disease

## 2019-08-13 ENCOUNTER — Ambulatory Visit: Payer: Medicare Other | Admitting: Physical Therapy

## 2019-08-13 ENCOUNTER — Other Ambulatory Visit: Payer: Self-pay

## 2019-08-13 DIAGNOSIS — R296 Repeated falls: Secondary | ICD-10-CM | POA: Diagnosis not present

## 2019-08-13 DIAGNOSIS — R2681 Unsteadiness on feet: Secondary | ICD-10-CM

## 2019-08-13 DIAGNOSIS — R2689 Other abnormalities of gait and mobility: Secondary | ICD-10-CM

## 2019-08-13 DIAGNOSIS — M21372 Foot drop, left foot: Secondary | ICD-10-CM

## 2019-08-13 DIAGNOSIS — M6281 Muscle weakness (generalized): Secondary | ICD-10-CM

## 2019-08-13 DIAGNOSIS — M25672 Stiffness of left ankle, not elsewhere classified: Secondary | ICD-10-CM

## 2019-08-13 DIAGNOSIS — R293 Abnormal posture: Secondary | ICD-10-CM

## 2019-08-13 DIAGNOSIS — I69354 Hemiplegia and hemiparesis following cerebral infarction affecting left non-dominant side: Secondary | ICD-10-CM

## 2019-08-13 NOTE — Telephone Encounter (Signed)
-----   Message from Darlyn Chamber June, RN sent at 08/13/2019  7:41 AM EDT ----- Please schedule pt for an echo in 6 months. Thank you!

## 2019-08-13 NOTE — Telephone Encounter (Signed)
Left message for pt to call back and schedule echo per staff message

## 2019-08-13 NOTE — Therapy (Addendum)
[facility] [address] [place], [place], [address] Phone: [phone]   Fax:  [phone]  Physical Therapy Treatment and 10th visit Progress Note  Patient Details  Name: Gregory Wiley MRN: [id number] Date of Birth: [birth year] Referring Provider (PT): Felecia Shelling, Nanine Means, MD   Encounter Date: 08/13/2019   Progress Note Reporting Period 07/06/19 to 08/13/19  See note below for Objective Data and Assessment of Progress/Goals.   [name], PT, DPT 08/17/19    8:45 PM     PT End of Session - 08/13/19 1402    Visit Number  19 (10th visit note performed today)   Number of Visits  34    Date for PT Re-Evaluation  10/04/19    Authorization Type  [employer] Medicare - 10th visit PN    Progress Note Due on Visit  29   PT Start Time  1400    PT Stop Time  1445    PT Time Calculation (min)  45 min    Equipment Utilized During Treatment  Gait belt;Other (comment)   Bioness   Activity Tolerance  Patient tolerated treatment well;No increased pain;Patient limited by fatigue    Behavior During Therapy  [facility] for tasks assessed/performed       Past Medical History:  Diagnosis Date  . Adrenal insufficiency ([place])   . Anxiety   . Basal cell carcinoma of skin of left ankle   . Bleeding stomach ulcer 2015  . Coronary artery disease    stent, then CABG 07/20/10  . Daily headache    "for the past month" (11/11/2014)  . Depression   . Diverticulosis   . GERD (gastroesophageal reflux disease)   . Gout   . H/O cardiomyopathy    ischemic, now last echo 07/30/11, EF 38%W  . History of hiatal hernia   . Hyperlipidemia   . Hypertension   . Internal hemorrhoids   . Left hemiplegia ([place])   . NSTEMI (non-ST elevated myocardial infarction) ([place])    NSTEMI- last cath 06/2011-Stent to LCX-DES, last nuc 07/30/11 low risk  . Obesity (BMI 30-39.9)   . OSA on CPAP    since July 2013- uses CPAP sometimes , states he has lost 147 lbs. since stroke  & doesn't use the CPAP as much as he use to.   . Plantar fasciitis of left foot   . Pneumonia    hosp. 12/2014  . Pulmonary embolism ([place]) 03/2014  . Stroke ([place]) 01/2014   "no control of LUE, can slightly LLE; memory problems since" (11/11/2014)  . Tubular adenoma of colon     Past Surgical History:  Procedure Laterality Date  . BASAL CELL CARCINOMA EXCISION Left 2015   ankle  . CARDIAC SURGERY    . CATARACT EXTRACTION Bilateral   . COLONOSCOPY N/A 02/10/2013   Procedure: COLONOSCOPY;  Surgeon: Ladene Artist, MD;  Location: [facility];  Service: Endoscopy;  Laterality: N/A;  . CORONARY ANGIOPLASTY WITH STENT PLACEMENT  07/01/2011   DES-Resolute to native LCX  . CORONARY ANGIOPLASTY WITH STENT PLACEMENT  12/01/2007   COMPLEX 5 LESION PCI INCLUDING CUTTING BALLOON AND 4 CYPHER DESs  . CORONARY ARTERY BYPASS GRAFT  07/20/2010    LIMA-LAD; VG-ACUTE MARG of RCA; Seq VG-distal RCA & then pda  . EP IMPLANTABLE DEVICE N/A 02/14/2016   Procedure: Loop Recorder Removal;  Surgeon: Thompson Grayer, MD;  Location: [facility] CV LAB;  Service: Cardiovascular;  Laterality: N/A;  . ESOPHAGOGASTRODUODENOSCOPY  01/2014  gastric ulcer, erosive gastroduodenitis, H Pylori negative.   . ESOPHAGOGASTRODUODENOSCOPY (EGD) WITH PROPOFOL N/A 11/14/2014   Procedure: ESOPHAGOGASTRODUODENOSCOPY (EGD) WITH PROPOFOL;  Surgeon: Milus Banister, MD;  Location: [facility];  Service: Endoscopy;  Laterality: N/A;  . HAND SURGERY Right    to take glass out  . HERNIA REPAIR    . INTRAMEDULLARY (IM) NAIL INTERTROCHANTERIC Right 02/07/2015   Procedure: INTRAMEDULLARY (IM) NAIL INTERTROCHANTRIC RIGHT HIP;  Surgeon: Renette Butters, MD;  Location: [facility];  Service: Orthopedics;  Laterality: Right;  . KNEE ARTHROSCOPY Right   . LEFT HEART CATHETERIZATION WITH CORONARY/GRAFT ANGIOGRAM  07/01/2011   Procedure: LEFT HEART CATHETERIZATION WITH Beatrix Fetters;  Surgeon: Sanda Klein, MD;  Location: [facility] CATH LAB;   Service: Cardiovascular;;  . LOOP RECORDER IMPLANT  02/15/2014   MDT LINQ implanted by Dr Rayann Heman for cryptogenic stroke  . PERCUTANEOUS CORONARY STENT INTERVENTION (PCI-S) Right 07/01/2011   Procedure: PERCUTANEOUS CORONARY STENT INTERVENTION (PCI-S);  Surgeon: Sanda Klein, MD;  Location: [facility] CATH LAB;  Service: Cardiovascular;  Laterality: Right;  . PICC LINE PLACE PERIPHERAL ([place] HX)  06/2015  . RETINAL DETACHMENT SURGERY Right   . TEE WITHOUT CARDIOVERSION N/A 02/15/2014   Procedure: TRANSESOPHAGEAL ECHOCARDIOGRAM (TEE);  Surgeon: Josue Hector, MD;  Location: [facility] ENDOSCOPY;  Service: Cardiovascular;  Laterality: N/A;  . UMBILICAL HERNIA REPAIR  2006    There were no vitals filed for this visit.  Subjective Assessment - 08/13/19 1401    Subjective  No new complaints. No falls or pain to report. Brace will be ready around the 7th of June.    Patient is accompained by:  Family member    Pertinent History  DNR; anxiety & depression, CAD with CABG, diverticulosis, gout, HLD, HTN, left hemiplegia, NSTEMI (2013), morbid obesity, PE (2016), CVA (2015), tubular adenoma of colon, OSA w/CPAP, right hip fx w/ORIF (2016), iron deficiency anemia    Limitations  Standing;Walking    Patient Stated Goals  to obtain a Bioness for home use; to return to PLOF of ambulating household distances and short community distances with quad cane    Currently in Pain?  No/denies    Pain Score  0-No pain         [facility] PT Assessment - 08/13/19 1404      Standardized Balance Assessment   Standardized Balance Assessment  Timed Up and Go Test;10 meter walk test    10 Meter Walk  29.53 seconds over 10 feet with quad cane and min guard to min assist=       Timed Up and Go Test   TUG  Normal TUG    Normal TUG (seconds)  108.56   full TUG with quad cane no brace, min to mod assit           [facility] Adult PT Treatment/Exercise - 08/13/19 1413      Exercises   Exercises  Other Exercises    Other Exercises    concurrent with Bioness to left LE: with balance board under foot worked on [employer] DF with on times, rest with off times for 15 reps; long arc quads with AA for 10 reps; in standing with right UE support- mini squats x 8 reps with on time, rest with off time. then after a seated rest break pt performed 5 more reps of mini squats with on time, rest with off times. then after a rest break had pt tap bottom of parallel bars/back to floor with left LE for 4 reps with  assistance needed.       Acupuncturist Location  L anterior tibialis and L quad muscles    Electrical Stimulation Action  for increased muscle activation and strengthening with exercises    Electrical Stimulation Parameters  refer to tablet 2 for adjusted parameters; small round cloth    Electrical Stimulation Goals  Strength;Tone;Edema;Neuromuscular facilitation               PT Short Term Goals - 08/13/19 1403      PT SHORT TERM GOAL #1   Title  Patient will be able to perform 2-3 exercises in standing for HEP  (RESET STG AFTER 30 DAYS)    Baseline  08/05/19: met is session today    Status  Achieved    Target Date  08/05/19      PT SHORT TERM GOAL #2   Title  Pt will be able to perform full TUG with cane    Baseline  08/13/19: full TUG performed with score 108.56 sec's with LBQC, no AFO    Time  --    Period  --    Status  Achieved    Target Date  08/05/19      PT SHORT TERM GOAL #3   Title  Pt tolerates use of Bioness unit for transfer and gait training with quad cane and min A    Baseline  08/05/19: met in session today    Status  Achieved    Target Date  08/05/19      PT SHORT TERM GOAL #4   Title  Pt will improve gait velocity (over 10 foot distance) to >/= .5 ft/sec with cane and new AFO    Baseline  08/13/19: 0.35 ft/sec with cane, no AFO (has not been delivered to date), improved just not to goal level (was 0.17 ft/sec), no    Time  --    Period  --    Status  Partially Met     Target Date  08/05/19      PT SHORT TERM GOAL #5   Title  Pt will consistently perform stand pivot transfers to L and R with cane and AFO with min A    Baseline  08/05/19: pt does not have AFO as of yet. Has been casted, waiting on delivery. does perform this with Bioness.    Status  Partially Met    Target Date  08/05/19        PT Long Term Goals - 07/06/19 1527      PT LONG TERM GOAL #1   Title  Patient and wife demonstrate and verbalizes independence with final HEP    Baseline  difficulty finding exercises for pt to do at home, sits and sleeps in recliner; no access to bed    Time  12    Period  Weeks    Status  Revised    Target Date  10/04/19      PT LONG TERM GOAL #2   Title  Pt will improve TUG with cane and Bioness/AFO to </= 60 seconds    Baseline  TBD for full TUG    Time  12    Period  Weeks    Status  Revised    Target Date  10/04/19      PT LONG TERM GOAL #3   Title  Patient will improve gait velocity over full distance with quad cane and most appropriate orthosis (bioness or AFO) to >0.8 ft/sec  Time  12    Period  Weeks    Status  Revised    Target Date  10/04/19      PT LONG TERM GOAL #4   Title  Patient demonstrates improved L passive ankle DF by at least 10 degrees.    Baseline  lacking 24 deg with knee extended, lacking 10 deg with knee flexed > Lacking 20 deg with knee extended, lacking 16 deg with knee flexed    Time  12    Period  Weeks    Status  Revised    Target Date  10/04/19      PT LONG TERM GOAL #5   Title  Pt will demonstrate ability to transfer w/c <> mat/chair with quad cane, most appropriate orthosis and supervision    Baseline  min-mod A with quad cane    Time  12    Period  Weeks    Status  Revised    Target Date  10/04/19       STG and LTG revised by primary PT:  PT Short Term Goals - 08/17/19 2038      PT SHORT TERM GOAL #1   Title  Pt will be able to perform full HEP with wife's min A    Status  Revised    Target Date   09/05/19      PT SHORT TERM GOAL #2   Title  Pt will decrease TUG time with [facility] and AFO to </= 80 seconds with min A    Baseline  08/13/19: full TUG performed with score 108.56 sec's with LBQC, no AFO    Time  4    Period  Weeks    Status  Revised    Target Date  09/05/19      PT SHORT TERM GOAL #4   Title  Pt will improve gait velocity (over 10 foot distance) to >/= .5 ft/sec with cane and new AFO    Baseline  08/13/19: 0.35 ft/sec with cane, no AFO (has not been delivered to date), improved just not to goal level (was 0.17 ft/sec), no    Time  4    Period  Weeks    Status  Revised    Target Date  09/05/19      PT SHORT TERM GOAL #5   Title  Pt will consistently perform stand pivot transfers to L and R with cane and AFO with min A    Baseline  08/05/19: pt does not have AFO as of yet. Has been casted, waiting on delivery. does perform this with Bioness.    Time  4    Period  Weeks    Status  Revised    Target Date  09/05/19      PT Long Term Goals - 08/17/19 2042      PT LONG TERM GOAL #1   Title  Patient and wife demonstrate and verbalizes independence with final HEP    Baseline  difficulty finding exercises for pt to do at home, sits and sleeps in recliner; no access to bed    Time  12    Period  Weeks    Status  Revised    Target Date  10/04/19      PT LONG TERM GOAL #2   Title  Pt will improve TUG with cane and Bioness/AFO to </= 60 seconds    Baseline  108 seconds with cane and no AFO    Time  12  Period  Weeks    Status  Revised    Target Date  10/04/19      PT LONG TERM GOAL #3   Title  Patient will improve gait velocity over full distance with quad cane and most appropriate orthosis (bioness or AFO) to >0.8 ft/sec    Baseline  .35 over 10 foot distance with cane and no AFO    Time  12    Period  Weeks    Status  Revised    Target Date  10/04/19      PT LONG TERM GOAL #4   Title  Patient demonstrates improved L passive ankle DF by at least 10 degrees.     Baseline  lacking 24 deg with knee extended, lacking 10 deg with knee flexed > Lacking 20 deg with knee extended, lacking 16 deg with knee flexed    Time  12    Period  Weeks    Status  Revised    Target Date  10/04/19      PT LONG TERM GOAL #5   Title  Pt will demonstrate ability to transfer w/c <> mat/chair with quad cane, most appropriate orthosis and supervision    Baseline  min-mod A with quad cane    Time  12    Period  Weeks    Status  Revised    Target Date  10/04/19           Plan - 08/13/19 1403    Clinical Impression Statement  Today's skilled session focused on remaining STGs with TUG goal met and 10 foot gait speed goal partially met. Remainder of session continued with use of Bioness to left LE concurrent with exercises for strengthening. The pt is making steady progress toward goals and should benefit from continued PT to progress toward updated STGs (to be done by primary PT) and unmet LTGs.    Personal Factors and Comorbidities  Age;Fitness;Past/Current Experience;Comorbidity 3+;Time since onset of injury/illness/exacerbation    Comorbidities  anixety & depression, CAD, diverticulosis, gout, HLD, HTN, left hemiplegia, NSTEMI (2013), morbid obesity, PE (2016), CVA (2015), tubular adenoma of colon, OSA w/CPAP, right hip fx w/ORIF (2016), iron deficiency anemia    Examination-Activity Limitations  Squat;Stairs;Stand;Dressing;Transfers;Bed Mobility;Locomotion Level    Examination-Participation Restrictions  Church;Community Activity    Stability/Clinical Decision Making  Evolving/Moderate complexity    Rehab Potential  Good    PT Frequency  2x / week    PT Duration  12 weeks    PT Treatment/Interventions  ADLs/Self Care Home Management;Electrical Stimulation;DME Instruction;Balance training;Therapeutic exercise;Orthotic Fit/Training;Gait training;Neuromuscular re-education;Stair training;Functional mobility training;Patient/family education;Therapeutic activities;Manual  techniques;Compression bandaging;Passive range of motion    PT Next Visit Plan  work on standing balance and tolerance; left LE stretching and exercises with gravity minimized; Bioness in training mode for ex's and with gait with cane.    Consulted and Agree with Plan of Care  Patient;Family member/caregiver    Family Member Consulted  spouse       Patient will benefit from skilled therapeutic intervention in order to improve the following deficits and impairments:  Abnormal gait, Decreased range of motion, Difficulty walking, Impaired tone, Obesity, Impaired UE functional use, Decreased endurance, Decreased activity tolerance, Decreased skin integrity, Decreased balance, Decreased knowledge of use of DME, Impaired flexibility, Postural dysfunction, Impaired sensation, Increased edema, Decreased strength, Decreased mobility  Visit Diagnosis: Muscle weakness (generalized)  Foot drop, left  Unsteadiness on feet  Spastic hemiplegia of left nondominant side as late effect of cerebral infarction (  [place])  Other abnormalities of gait and mobility  Stiffness of left ankle, not elsewhere classified  Repeated falls  Abnormal posture     Problem List Patient Active Problem List   Diagnosis Date Noted  . Left foot drop 07/14/2019  . GERD (gastroesophageal reflux disease) 06/18/2019  . Tinea cruris 07/20/2017  . Hemorrhoid 07/20/2017  . Irritability 06/08/2017  . Fall at home 05/06/2017  . Advance care planning 11/28/2016  . Skin mass 11/28/2016  . Lower urinary tract symptoms (LUTS) 09/05/2016  . Hearing loss 09/05/2016  . Excessive daytime sleepiness 04/16/2016  . Exposure to TB 02/21/2016  . Vitamin D deficiency 02/21/2016  . Anemia 11/10/2015  . Left thigh pain 11/10/2015  . Neck pain 10/12/2015  . Hemiplegia affecting left side in left-dominant patient as late effect of cerebrovascular disease ([place]) 09/03/2015  . Medicare annual wellness visit, subsequent 08/14/2015  . Acute  pulmonary embolism ([place]) 07/25/2015  . Chest wall pain 07/15/2015  . History of pulmonary embolism 07/15/2015  . Chronic anticoagulation 05/22/2015  . Gait disturbance 05/15/2015  . DNR (do not resuscitate) 01/19/2015  . Hypotension 01/14/2015  . Paresthesia   . Depression 12/14/2014  . OSA (obstructive sleep apnea) 11/30/2014  . Chronic combined systolic and diastolic congestive heart failure ([place]) 11/11/2014  . SOB (shortness of breath) 04/17/2014  . Hemiplegia affecting left dominant side ([place]) 02/16/2014  . 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 06/30/2011    Willow Ora, PTA, [facility] [address], [place], [place] [address] [phone] 08/13/19, 4:06 PM   Name: Gregory Wiley MRN: [id number] Date of Birth: [birth year]

## 2019-08-14 ENCOUNTER — Encounter: Payer: Self-pay | Admitting: Cardiovascular Disease

## 2019-08-15 ENCOUNTER — Other Ambulatory Visit: Payer: Self-pay | Admitting: Family Medicine

## 2019-08-17 ENCOUNTER — Other Ambulatory Visit: Payer: Self-pay

## 2019-08-17 ENCOUNTER — Ambulatory Visit: Payer: Medicare Other | Attending: Neurology | Admitting: Physical Therapy

## 2019-08-17 ENCOUNTER — Encounter: Payer: Self-pay | Admitting: Physical Therapy

## 2019-08-17 VITALS — BP 152/78

## 2019-08-17 DIAGNOSIS — R296 Repeated falls: Secondary | ICD-10-CM | POA: Diagnosis present

## 2019-08-17 DIAGNOSIS — M25672 Stiffness of left ankle, not elsewhere classified: Secondary | ICD-10-CM | POA: Insufficient documentation

## 2019-08-17 DIAGNOSIS — M6281 Muscle weakness (generalized): Secondary | ICD-10-CM | POA: Insufficient documentation

## 2019-08-17 DIAGNOSIS — M21372 Foot drop, left foot: Secondary | ICD-10-CM | POA: Diagnosis present

## 2019-08-17 DIAGNOSIS — R293 Abnormal posture: Secondary | ICD-10-CM | POA: Insufficient documentation

## 2019-08-17 DIAGNOSIS — R2689 Other abnormalities of gait and mobility: Secondary | ICD-10-CM | POA: Diagnosis present

## 2019-08-17 DIAGNOSIS — R2681 Unsteadiness on feet: Secondary | ICD-10-CM | POA: Diagnosis present

## 2019-08-17 DIAGNOSIS — I69354 Hemiplegia and hemiparesis following cerebral infarction affecting left non-dominant side: Secondary | ICD-10-CM | POA: Diagnosis present

## 2019-08-18 NOTE — Therapy (Signed)
[facility] [address] [place], [place], [address] Phone: [phone]   Fax:  [phone]  Physical Therapy Treatment  Patient Details  Name: Gregory Wiley MRN: [id number] Date of Birth: [birth year] Referring Provider (PT): Felecia Shelling, Nanine Means, MD   Encounter Date: 08/17/2019  PT End of Session - 08/17/19 1322    Visit Number  20    Number of Visits  34    Date for PT Re-Evaluation  10/04/19    Authorization Type  [employer] Medicare - 10th visit PN    Progress Note Due on Visit  42    PT Start Time  1317    PT Stop Time  1400    PT Time Calculation (min)  43 min    Equipment Utilized During Treatment  Gait belt;Other (comment)   Bioness   Activity Tolerance  Patient tolerated treatment well;No increased pain;Patient limited by fatigue    Behavior During Therapy  [facility] for tasks assessed/performed       Past Medical History:  Diagnosis Date  . Adrenal insufficiency ([place])   . Anxiety   . Basal cell carcinoma of skin of left ankle   . Bleeding stomach ulcer 2015  . Coronary artery disease    stent, then CABG 07/20/10  . Daily headache    "for the past month" (11/11/2014)  . Depression   . Diverticulosis   . GERD (gastroesophageal reflux disease)   . Gout   . H/O cardiomyopathy    ischemic, now last echo 07/30/11, EF 38%W  . History of hiatal hernia   . Hyperlipidemia   . Hypertension   . Internal hemorrhoids   . Left hemiplegia ([place])   . NSTEMI (non-ST elevated myocardial infarction) ([place])    NSTEMI- last cath 06/2011-Stent to LCX-DES, last nuc 07/30/11 low risk  . Obesity (BMI 30-39.9)   . OSA on CPAP    since July 2013- uses CPAP sometimes , states he has lost 147 lbs. since stroke & doesn't use the CPAP as much as he use to.   . Plantar fasciitis of left foot   . Pneumonia    hosp. 12/2014  . Pulmonary embolism ([place]) 03/2014  . Stroke ([place]) 01/2014   "no control of LUE, can slightly LLE; memory problems since"  (11/11/2014)  . Tubular adenoma of colon     Past Surgical History:  Procedure Laterality Date  . BASAL CELL CARCINOMA EXCISION Left 2015   ankle  . CARDIAC SURGERY    . CATARACT EXTRACTION Bilateral   . COLONOSCOPY N/A 02/10/2013   Procedure: COLONOSCOPY;  Surgeon: Ladene Artist, MD;  Location: [facility];  Service: Endoscopy;  Laterality: N/A;  . CORONARY ANGIOPLASTY WITH STENT PLACEMENT  07/01/2011   DES-Resolute to native LCX  . CORONARY ANGIOPLASTY WITH STENT PLACEMENT  12/01/2007   COMPLEX 5 LESION PCI INCLUDING CUTTING BALLOON AND 4 CYPHER DESs  . CORONARY ARTERY BYPASS GRAFT  07/20/2010    LIMA-LAD; VG-ACUTE MARG of RCA; Seq VG-distal RCA & then pda  . EP IMPLANTABLE DEVICE N/A 02/14/2016   Procedure: Loop Recorder Removal;  Surgeon: Thompson Grayer, MD;  Location: [facility];  Service: Cardiovascular;  Laterality: N/A;  . ESOPHAGOGASTRODUODENOSCOPY  01/2014   gastric ulcer, erosive gastroduodenitis, H Pylori negative.   . ESOPHAGOGASTRODUODENOSCOPY (EGD) WITH PROPOFOL N/A 11/14/2014   Procedure: ESOPHAGOGASTRODUODENOSCOPY (EGD) WITH PROPOFOL;  Surgeon: Milus Banister, MD;  Location: [facility];  Service: Endoscopy;  Laterality: N/A;  . HAND SURGERY  Right    to take glass out  . HERNIA REPAIR    . INTRAMEDULLARY (IM) NAIL INTERTROCHANTERIC Right 02/07/2015   Procedure: INTRAMEDULLARY (IM) NAIL INTERTROCHANTRIC RIGHT HIP;  Surgeon: Renette Butters, MD;  Location: [facility];  Service: Orthopedics;  Laterality: Right;  . KNEE ARTHROSCOPY Right   . LEFT HEART CATHETERIZATION WITH CORONARY/GRAFT ANGIOGRAM  07/01/2011   Procedure: LEFT HEART CATHETERIZATION WITH Beatrix Fetters;  Surgeon: Sanda Klein, MD;  Location: [facility] CATH LAB;  Service: Cardiovascular;;  . LOOP RECORDER IMPLANT  02/15/2014   MDT LINQ implanted by Dr Rayann Heman for cryptogenic stroke  . PERCUTANEOUS CORONARY STENT INTERVENTION (PCI-S) Right 07/01/2011   Procedure: PERCUTANEOUS CORONARY STENT INTERVENTION  (PCI-S);  Surgeon: Sanda Klein, MD;  Location: [facility] CATH LAB;  Service: Cardiovascular;  Laterality: Right;  . PICC LINE PLACE PERIPHERAL ([place] HX)  06/2015  . RETINAL DETACHMENT SURGERY Right   . TEE WITHOUT CARDIOVERSION N/A 02/15/2014   Procedure: TRANSESOPHAGEAL ECHOCARDIOGRAM (TEE);  Surgeon: Josue Hector, MD;  Location: [facility];  Service: Cardiovascular;  Laterality: N/A;  . UMBILICAL HERNIA REPAIR  2006    Vitals:   08/17/19 1320 08/17/19 1358  BP: 109/70 (!) 152/78    Subjective Assessment - 08/17/19 1320    Subjective  Reports feeling more "foggy" today. Unsure why. No falls.    Patient is accompained by:  Family member    Pertinent History  DNR; anxiety & depression, CAD with CABG, diverticulosis, gout, HLD, HTN, left hemiplegia, NSTEMI (2013), morbid obesity, PE (2016), CVA (2015), tubular adenoma of colon, OSA w/CPAP, right hip fx w/ORIF (2016), iron deficiency anemia    Limitations  Standing;Walking    Patient Stated Goals  to obtain a Bioness for home use; to return to PLOF of ambulating household distances and short community distances with quad cane    Currently in Pain?  No/denies    Pain Score  0-No pain         08/17/19 1322  Transfers  Transfers Sit to Stand;Stand to Sit  Sit to Stand 4: Min guard;With upper extremity assist;From chair/3-in-1  Sit to Stand Details Tactile cues for weight shifting;Verbal cues for sequencing;Verbal cues for safe use of DME/AE;Verbal cues for precautions/safety  Stand to Sit 4: Min guard;With upper extremity assist;To chair/3-in-1  Stand to Sit Details (indicate cue type and reason) Tactile cues for weight shifting;Verbal cues for sequencing;Verbal cues for technique;Verbal cues for safe use of DME/AE  Ambulation/Gait  Ambulation/Gait Yes  Ambulation/Gait Assistance 4: Min guard;4: Min assist  Ambulation/Gait Assistance Details cues on posture, step length and step placement with gait.   Ambulation Distance (Feet) 12  Feet (x1, 10 x1)  Assistive device Small based quad cane;Other (Comment)  Gait Pattern Step-to pattern;Decreased step length - left;Decreased stance time - left;Decreased hip/knee flexion - left;Decreased dorsiflexion - left;Poor foot clearance - left;Scissoring  Exercises  Exercises Other Exercises  Other Exercises  concurrent with Bioness to left LE: seated with left foot on balance board for AA DF with on time, rest with off time for 15 reps; long arc quad for 15 reps with on time, rest with off time, min AA needed at times. in standing mini squats with on time, rest with off times for 8 reps.    Programme researcher, broadcasting/film/video Location L anterior tibialis and L quad muscles  Electrical Stimulation Action for increased muscle activation and strengthening with gait/ex's  Electrical Stimulation Parameters refer to tablet 2 for adjusted parameters; round cloth electrodes  Electrical Stimulation Goals Strength;Tone;Edema;Neuromuscular facilitation        PT Short Term Goals - 08/17/19 2038      PT SHORT TERM GOAL #1   Title  Pt will be able to perform full HEP with wife's min A    Status  Revised    Target Date  09/05/19      PT SHORT TERM GOAL #2   Title  Pt will decrease TUG time with [facility] and AFO to </= 80 seconds with min A    Baseline  08/13/19: full TUG performed with score 108.56 sec's with LBQC, no AFO    Time  4    Period  Weeks    Status  Revised    Target Date  09/05/19      PT SHORT TERM GOAL #4   Title  Pt will improve gait velocity (over 10 foot distance) to >/= .5 ft/sec with cane and new AFO    Baseline  08/13/19: 0.35 ft/sec with cane, no AFO (has not been delivered to date), improved just not to goal level (was 0.17 ft/sec), no    Time  4    Period  Weeks    Status  Revised    Target Date  09/05/19      PT SHORT TERM GOAL #5   Title  Pt will consistently perform stand pivot transfers to L and R with cane and AFO with min A    Baseline  08/05/19:  pt does not have AFO as of yet. Has been casted, waiting on delivery. does perform this with Bioness.    Time  4    Period  Weeks    Status  Revised    Target Date  09/05/19        PT Long Term Goals - 08/17/19 2042      PT LONG TERM GOAL #1   Title  Patient and wife demonstrate and verbalizes independence with final HEP    Baseline  difficulty finding exercises for pt to do at home, sits and sleeps in recliner; no access to bed    Time  12    Period  Weeks    Status  Revised    Target Date  10/04/19      PT LONG TERM GOAL #2   Title  Pt will improve TUG with cane and Bioness/AFO to </= 60 seconds    Baseline  108 seconds with cane and no AFO    Time  12    Period  Weeks    Status  Revised    Target Date  10/04/19      PT LONG TERM GOAL #3   Title  Patient will improve gait velocity over full distance with quad cane and most appropriate orthosis (bioness or AFO) to >0.8 ft/sec    Baseline  .35 over 10 foot distance with cane and no AFO    Time  12    Period  Weeks    Status  Revised    Target Date  10/04/19      PT LONG TERM GOAL #4   Title  Patient demonstrates improved L passive ankle DF by at least 10 degrees.    Baseline  lacking 24 deg with knee extended, lacking 10 deg with knee flexed > Lacking 20 deg with knee extended, lacking 16 deg with knee flexed    Time  12    Period  Weeks    Status  Revised    Target  Date  10/04/19      PT LONG TERM GOAL #5   Title  Pt will demonstrate ability to transfer w/c <> mat/chair with quad cane, most appropriate orthosis and supervision    Baseline  min-mod A with quad cane    Time  12    Period  Weeks    Status  Revised    Target Date  10/04/19          08/17/19 1322  Plan  Clinical Impression Statement Today's skilled session continued with use of Bioness with ex's and gait. No issues reported or noted in session. No increase in fogginess reported by patient. The pt is progressing toward goals and should benefit from  continued PT to progress toward unmet goals.  Personal Factors and Comorbidities Age;Fitness;Past/Current Experience;Comorbidity 3+;Time since onset of injury/illness/exacerbation  Comorbidities anixety & depression, CAD, diverticulosis, gout, HLD, HTN, left hemiplegia, NSTEMI (2013), morbid obesity, PE (2016), CVA (2015), tubular adenoma of colon, OSA w/CPAP, right hip fx w/ORIF (2016), iron deficiency anemia  Examination-Activity Limitations Squat;Stairs;Stand;Dressing;Transfers;Bed Mobility;Locomotion Level  Examination-Participation Restrictions Church;Community Activity  Pt will benefit from skilled therapeutic intervention in order to improve on the following deficits Abnormal gait;Decreased range of motion;Difficulty walking;Impaired tone;Obesity;Impaired UE functional use;Decreased endurance;Decreased activity tolerance;Decreased skin integrity;Decreased balance;Decreased knowledge of use of DME;Impaired flexibility;Postural dysfunction;Impaired sensation;Increased edema;Decreased strength;Decreased mobility  Stability/Clinical Decision Making Evolving/Moderate complexity  Rehab Potential Good  PT Frequency 2x / week  PT Duration 12 weeks  PT Treatment/Interventions ADLs/Self Care Home Management;Electrical Stimulation;DME Instruction;Balance training;Therapeutic exercise;Orthotic Fit/Training;Gait training;Neuromuscular re-education;Stair training;Functional mobility training;Patient/family education;Therapeutic activities;Manual techniques;Compression bandaging;Passive range of motion  PT Next Visit Plan work on standing balance and tolerance; left LE stretching and exercises with gravity minimized; Bioness in training mode for ex's and with gait with cane.  Consulted and Agree with Plan of Care Patient;Family member/caregiver  Family Member Consulted spouse         Patient will benefit from skilled therapeutic intervention in order to improve the following deficits and impairments:   Abnormal gait, Decreased range of motion, Difficulty walking, Impaired tone, Obesity, Impaired UE functional use, Decreased endurance, Decreased activity tolerance, Decreased skin integrity, Decreased balance, Decreased knowledge of use of DME, Impaired flexibility, Postural dysfunction, Impaired sensation, Increased edema, Decreased strength, Decreased mobility  Visit Diagnosis: Muscle weakness (generalized)  Foot drop, left  Unsteadiness on feet  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 Left foot drop 07/14/2019  . GERD (gastroesophageal reflux disease) 06/18/2019  . Tinea cruris 07/20/2017  . Hemorrhoid 07/20/2017  . Irritability 06/08/2017  . Fall at home 05/06/2017  . Advance care planning 11/28/2016  . Skin mass 11/28/2016  . Lower urinary tract symptoms (LUTS) 09/05/2016  . Hearing loss 09/05/2016  . Excessive daytime sleepiness 04/16/2016  . Exposure to TB 02/21/2016  . Vitamin D deficiency 02/21/2016  . Anemia 11/10/2015  . Left thigh pain 11/10/2015  . Neck pain 10/12/2015  . Hemiplegia affecting left side in left-dominant patient as late effect of cerebrovascular disease ([place]) 09/03/2015  . Medicare annual wellness visit, subsequent 08/14/2015  . Acute pulmonary embolism ([place]) 07/25/2015  . Chest wall pain 07/15/2015  . History of pulmonary embolism 07/15/2015  . Chronic anticoagulation 05/22/2015  . Gait disturbance 05/15/2015  . DNR (do not resuscitate) 01/19/2015  . Hypotension 01/14/2015  . Paresthesia   .  Depression 12/14/2014  . OSA (obstructive sleep apnea) 11/30/2014  . Chronic combined systolic and diastolic congestive heart failure ([place]) 11/11/2014  . SOB (shortness of breath) 04/17/2014  . Hemiplegia affecting left dominant side ([place]) 02/16/2014  . Embolic stroke involving right middle  cerebral artery ([place]) 02/12/2014  . 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 06/30/2011    Willow Ora, PTA, [facility] [address], [place], [place] [address] [phone] 08/19/19, 12:54 PM   Name: Gregory Wiley MRN: [id number] Date of Birth: Nov 21, 1950

## 2019-08-20 ENCOUNTER — Ambulatory Visit: Payer: Medicare Other | Admitting: Physical Therapy

## 2019-08-20 ENCOUNTER — Encounter: Payer: Self-pay | Admitting: Physical Therapy

## 2019-08-20 ENCOUNTER — Other Ambulatory Visit: Payer: Self-pay

## 2019-08-20 DIAGNOSIS — M6281 Muscle weakness (generalized): Secondary | ICD-10-CM

## 2019-08-20 DIAGNOSIS — R293 Abnormal posture: Secondary | ICD-10-CM

## 2019-08-20 DIAGNOSIS — M25672 Stiffness of left ankle, not elsewhere classified: Secondary | ICD-10-CM

## 2019-08-20 DIAGNOSIS — I69354 Hemiplegia and hemiparesis following cerebral infarction affecting left non-dominant side: Secondary | ICD-10-CM

## 2019-08-20 DIAGNOSIS — R2681 Unsteadiness on feet: Secondary | ICD-10-CM

## 2019-08-20 DIAGNOSIS — R2689 Other abnormalities of gait and mobility: Secondary | ICD-10-CM

## 2019-08-20 DIAGNOSIS — M21372 Foot drop, left foot: Secondary | ICD-10-CM

## 2019-08-20 NOTE — Therapy (Signed)
[facility] [address] [place], [place], [address] Phone: [phone]   Fax:  [phone]  Physical Therapy Treatment  Patient Details  Name: Gregory Wiley MRN: [id number] Date of Birth: [birth year] Referring Provider (PT): Felecia Shelling, Nanine Means, MD   Encounter Date: 08/20/2019  PT End of Session - 08/20/19 1412    Visit Number  21    Number of Visits  34    Date for PT Re-Evaluation  10/04/19    Authorization Type  [employer] Medicare - 10th visit PN    Progress Note Due on Visit  29    PT Start Time  1402    PT Stop Time  1445    PT Time Calculation (min)  43 min    Equipment Utilized During Treatment  Gait belt;Other (comment)   left AFO   Activity Tolerance  Patient tolerated treatment well;No increased pain;Patient limited by fatigue    Behavior During Therapy  [facility] for tasks assessed/performed       Past Medical History:  Diagnosis Date  . Adrenal insufficiency ([place])   . Anxiety   . Basal cell carcinoma of skin of left ankle   . Bleeding stomach ulcer 2015  . Coronary artery disease    stent, then CABG 07/20/10  . Daily headache    "for the past month" (11/11/2014)  . Depression   . Diverticulosis   . GERD (gastroesophageal reflux disease)   . Gout   . H/O cardiomyopathy    ischemic, now last echo 07/30/11, EF 38%W  . History of hiatal hernia   . Hyperlipidemia   . Hypertension   . Internal hemorrhoids   . Left hemiplegia ([place])   . NSTEMI (non-ST elevated myocardial infarction) ([place])    NSTEMI- last cath 06/2011-Stent to LCX-DES, last nuc 07/30/11 low risk  . Obesity (BMI 30-39.9)   . OSA on CPAP    since July 2013- uses CPAP sometimes , states he has lost 147 lbs. since stroke & doesn't use the CPAP as much as he use to.   . Plantar fasciitis of left foot   . Pneumonia    hosp. 12/2014  . Pulmonary embolism ([place]) 03/2014  . Stroke ([place]) 01/2014   "no control of LUE, can slightly LLE; memory problems since"  (11/11/2014)  . Tubular adenoma of colon     Past Surgical History:  Procedure Laterality Date  . BASAL CELL CARCINOMA EXCISION Left 2015   ankle  . CARDIAC SURGERY    . CATARACT EXTRACTION Bilateral   . COLONOSCOPY N/A 02/10/2013   Procedure: COLONOSCOPY;  Surgeon: Ladene Artist, MD;  Location: [facility];  Service: Endoscopy;  Laterality: N/A;  . CORONARY ANGIOPLASTY WITH STENT PLACEMENT  07/01/2011   DES-Resolute to native LCX  . CORONARY ANGIOPLASTY WITH STENT PLACEMENT  12/01/2007   COMPLEX 5 LESION PCI INCLUDING CUTTING BALLOON AND 4 CYPHER DESs  . CORONARY ARTERY BYPASS GRAFT  07/20/2010    LIMA-LAD; VG-ACUTE MARG of RCA; Seq VG-distal RCA & then pda  . EP IMPLANTABLE DEVICE N/A 02/14/2016   Procedure: Loop Recorder Removal;  Surgeon: Thompson Grayer, MD;  Location: [facility] CV LAB;  Service: Cardiovascular;  Laterality: N/A;  . ESOPHAGOGASTRODUODENOSCOPY  01/2014   gastric ulcer, erosive gastroduodenitis, H Pylori negative.   . ESOPHAGOGASTRODUODENOSCOPY (EGD) WITH PROPOFOL N/A 11/14/2014   Procedure: ESOPHAGOGASTRODUODENOSCOPY (EGD) WITH PROPOFOL;  Surgeon: Milus Banister, MD;  Location: [facility];  Service: Endoscopy;  Laterality: N/A;  . HAND  SURGERY Right    to take glass out  . HERNIA REPAIR    . INTRAMEDULLARY (IM) NAIL INTERTROCHANTERIC Right 02/07/2015   Procedure: INTRAMEDULLARY (IM) NAIL INTERTROCHANTRIC RIGHT HIP;  Surgeon: Renette Butters, MD;  Location: [facility];  Service: Orthopedics;  Laterality: Right;  . KNEE ARTHROSCOPY Right   . LEFT HEART CATHETERIZATION WITH CORONARY/GRAFT ANGIOGRAM  07/01/2011   Procedure: LEFT HEART CATHETERIZATION WITH Beatrix Fetters;  Surgeon: Sanda Klein, MD;  Location: [facility] CATH LAB;  Service: Cardiovascular;;  . LOOP RECORDER IMPLANT  02/15/2014   MDT LINQ implanted by Dr Rayann Heman for cryptogenic stroke  . PERCUTANEOUS CORONARY STENT INTERVENTION (PCI-S) Right 07/01/2011   Procedure: PERCUTANEOUS CORONARY STENT INTERVENTION  (PCI-S);  Surgeon: Sanda Klein, MD;  Location: [facility] CATH LAB;  Service: Cardiovascular;  Laterality: Right;  . PICC LINE PLACE PERIPHERAL ([place] HX)  06/2015  . RETINAL DETACHMENT SURGERY Right   . TEE WITHOUT CARDIOVERSION N/A 02/15/2014   Procedure: TRANSESOPHAGEAL ECHOCARDIOGRAM (TEE);  Surgeon: Josue Hector, MD;  Location: [facility] ENDOSCOPY;  Service: Cardiovascular;  Laterality: N/A;  . UMBILICAL HERNIA REPAIR  2006    There were no vitals filed for this visit.  Subjective Assessment - 08/20/19 1405    Subjective  Has new AFO. Does not fit in his shoe, so he has on a post op shoe. Reports the brace feels heavy. Did not walk in it, only stood at [employer].    Patient is accompained by:  Family member   spouse   Pertinent History  DNR; anxiety & depression, CAD with CABG, diverticulosis, gout, HLD, HTN, left hemiplegia, NSTEMI (2013), morbid obesity, PE (2016), CVA (2015), tubular adenoma of colon, OSA w/CPAP, right hip fx w/ORIF (2016), iron deficiency anemia    Patient Stated Goals  to obtain a Bioness for home use; to return to PLOF of ambulating household distances and short community distances with quad cane    Currently in Pain?  No/denies    Pain Score  0-No pain            [facility] Adult PT Treatment/Exercise - 08/20/19 1413      Transfers   Transfers  Sit to Stand;Stand to Sit    Sit to Stand  4: Min guard;With upper extremity assist;From chair/3-in-1    Sit to Stand Details  Verbal cues for sequencing;Verbal cues for safe use of DME/AE;Verbal cues for precautions/safety    Stand to Sit  4: Min guard;With upper extremity assist;To chair/3-in-1    Stand to Sit Details (indicate cue type and reason)  Tactile cues for weight shifting;Verbal cues for sequencing;Verbal cues for technique;Verbal cues for safe use of DME/AE      Ambulation/Gait   Ambulation/Gait  Yes    Ambulation/Gait Assistance  4: Min guard;4: Min assist    Ambulation/Gait Assistance Details  cues on posture as pt  tends to lean fwd, increased bil step length and stance time. pt able to self advance left LE with AFO donned with improved step placement noted.     Ambulation Distance (Feet)  10 Feet   x 2   Assistive device  Small based quad cane;Other (Comment)    Gait Pattern  Step-to pattern;Decreased step length - left;Decreased stance time - left;Decreased hip/knee flexion - left;Decreased dorsiflexion - left;Poor foot clearance - left;Scissoring    Ambulation Surface  Level;Indoor    Pre-Gait Activities  standing at parallel bars with right UE support- working on lateral weight shifting for 10 reps each way, then right stance  for left LE fwc/bwd stepping with assistance needed to advance LE forward at times.       Self-Care   Self-Care  Other Self-Care Comments    Other Self-Care Comments   assessed skin under brace as he has had it on for 2 hours now, no issues noted.  intact after session as well. Discussed gradual build of wearing times with brace. Pt is to start with 2 hours on, 3 hours off for now      Exercises   Exercises  Other Exercises    Other Exercises   seated in wheelchair (no bioness today): left foot on balance board working on AA DF with 5 sec holds for 10 reps, then with a yellow band- around left ankle with pt sliding foot back against resistance, then sliding foot foward with emphasis on controlled movements for 10 reps; with yellow band around forefoot for assisted DF/resisted PF for 2 sets of 5 reps. minimal range noted with all ex's with assist needed on form at times.            PT Short Term Goals - 08/17/19 2038      PT SHORT TERM GOAL #1   Title  Pt will be able to perform full HEP with wife's min A    Status  Revised    Target Date  09/05/19      PT SHORT TERM GOAL #2   Title  Pt will decrease TUG time with [facility] and AFO to </= 80 seconds with min A    Baseline  08/13/19: full TUG performed with score 108.56 sec's with LBQC, no AFO    Time  4    Period  Weeks     Status  Revised    Target Date  09/05/19      PT SHORT TERM GOAL #4   Title  Pt will improve gait velocity (over 10 foot distance) to >/= .5 ft/sec with cane and new AFO    Baseline  08/13/19: 0.35 ft/sec with cane, no AFO (has not been delivered to date), improved just not to goal level (was 0.17 ft/sec), no    Time  4    Period  Weeks    Status  Revised    Target Date  09/05/19      PT SHORT TERM GOAL #5   Title  Pt will consistently perform stand pivot transfers to L and R with cane and AFO with min A    Baseline  08/05/19: pt does not have AFO as of yet. Has been casted, waiting on delivery. does perform this with Bioness.    Time  4    Period  Weeks    Status  Revised    Target Date  09/05/19        PT Long Term Goals - 08/17/19 2042      PT LONG TERM GOAL #1   Title  Patient and wife demonstrate and verbalizes independence with final HEP    Baseline  difficulty finding exercises for pt to do at home, sits and sleeps in recliner; no access to bed    Time  12    Period  Weeks    Status  Revised    Target Date  10/04/19      PT LONG TERM GOAL #2   Title  Pt will improve TUG with cane and Bioness/AFO to </= 60 seconds    Baseline  108 seconds with cane and no AFO  Time  12    Period  Weeks    Status  Revised    Target Date  10/04/19      PT LONG TERM GOAL #3   Title  Patient will improve gait velocity over full distance with quad cane and most appropriate orthosis (bioness or AFO) to >0.8 ft/sec    Baseline  .35 over 10 foot distance with cane and no AFO    Time  12    Period  Weeks    Status  Revised    Target Date  10/04/19      PT LONG TERM GOAL #4   Title  Patient demonstrates improved L passive ankle DF by at least 10 degrees.    Baseline  lacking 24 deg with knee extended, lacking 10 deg with knee flexed > Lacking 20 deg with knee extended, lacking 16 deg with knee flexed    Time  12    Period  Weeks    Status  Revised    Target Date  10/04/19      PT  LONG TERM GOAL #5   Title  Pt will demonstrate ability to transfer w/c <> mat/chair with quad cane, most appropriate orthosis and supervision    Baseline  min-mod A with quad cane    Time  12    Period  Weeks    Status  Revised    Target Date  10/04/19            Plan - 08/20/19 1412    Clinical Impression Statement  Today's skilled session initally addressed wear and use of pt's new AFO on left LE. Pt reports not liking he can't move his foot/ankle and that the brace is too heavy. Of note it does not fit into his sneaker so was wearing it with a post op shoe today. Remainder of session focused on LE strengthening without the use of Bioness. The pt is progressing and should benefit from contineud PT to progress toward unmet goals.    Personal Factors and Comorbidities  Age;Fitness;Past/Current Experience;Comorbidity 3+;Time since onset of injury/illness/exacerbation    Comorbidities  anixety & depression, CAD, diverticulosis, gout, HLD, HTN, left hemiplegia, NSTEMI (2013), morbid obesity, PE (2016), CVA (2015), tubular adenoma of colon, OSA w/CPAP, right hip fx w/ORIF (2016), iron deficiency anemia    Examination-Activity Limitations  Squat;Stairs;Stand;Dressing;Transfers;Bed Mobility;Locomotion Level    Examination-Participation Restrictions  Church;Community Activity    Stability/Clinical Decision Making  Evolving/Moderate complexity    Rehab Potential  Good    PT Frequency  2x / week    PT Duration  12 weeks    PT Treatment/Interventions  ADLs/Self Care Home Management;Electrical Stimulation;DME Instruction;Balance training;Therapeutic exercise;Orthotic Fit/Training;Gait training;Neuromuscular re-education;Stair training;Functional mobility training;Patient/family education;Therapeutic activities;Manual techniques;Compression bandaging;Passive range of motion    PT Next Visit Plan  work on standing balance and tolerance; left LE stretching and exercises with gravity minimized; Bioness in  training mode for ex's and with gait with cane.    Consulted and Agree with Plan of Care  Patient;Family member/caregiver    Family Member Consulted  spouse       Patient will benefit from skilled therapeutic intervention in order to improve the following deficits and impairments:  Abnormal gait, Decreased range of motion, Difficulty walking, Impaired tone, Obesity, Impaired UE functional use, Decreased endurance, Decreased activity tolerance, Decreased skin integrity, Decreased balance, Decreased knowledge of use of DME, Impaired flexibility, Postural dysfunction, Impaired sensation, Increased edema, Decreased strength, Decreased mobility  Visit Diagnosis: Muscle weakness (generalized)  Foot drop, left  Unsteadiness on feet  Spastic hemiplegia of left nondominant side as late effect of cerebral infarction [facility])  Other abnormalities of gait and mobility  Stiffness of left ankle, not elsewhere classified  Abnormal posture     Problem List Patient Active Problem List   Diagnosis Date Noted  . Left foot drop 07/14/2019  . GERD (gastroesophageal reflux disease) 06/18/2019  . Tinea cruris 07/20/2017  . Hemorrhoid 07/20/2017  . Irritability 06/08/2017  . Fall at home 05/06/2017  . Advance care planning 11/28/2016  . Skin mass 11/28/2016  . Lower urinary tract symptoms (LUTS) 09/05/2016  . Hearing loss 09/05/2016  . Excessive daytime sleepiness 04/16/2016  . Exposure to TB 02/21/2016  . Vitamin D deficiency 02/21/2016  . Anemia 11/10/2015  . Left thigh pain 11/10/2015  . Neck pain 10/12/2015  . Hemiplegia affecting left side in left-dominant patient as late effect of cerebrovascular disease ([place]) 09/03/2015  . Medicare annual wellness visit, subsequent 08/14/2015  . Acute pulmonary embolism ([place]) 07/25/2015  . Chest wall pain 07/15/2015  . History of pulmonary embolism 07/15/2015  . Chronic anticoagulation 05/22/2015  . Gait disturbance 05/15/2015  . DNR (do not  resuscitate) 01/19/2015  . Hypotension 01/14/2015  . Paresthesia   . Depression 12/14/2014  . OSA (obstructive sleep apnea) 11/30/2014  . Chronic combined systolic and diastolic congestive heart failure ([place]) 11/11/2014  . SOB (shortness of breath) 04/17/2014  . Hemiplegia affecting left dominant side ([place]) 02/16/2014  . 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 06/30/2011    Willow Ora, PTA, [facility] [address] [place], [place] [address] [phone] 08/20/19, 4:35 PM   Name: Gregory Wiley MRN: [id number] Date of Birth: March 08, 1951

## 2019-08-23 ENCOUNTER — Other Ambulatory Visit: Payer: Self-pay

## 2019-08-23 ENCOUNTER — Ambulatory Visit: Payer: Medicare Other | Admitting: Physical Therapy

## 2019-08-23 DIAGNOSIS — M6281 Muscle weakness (generalized): Secondary | ICD-10-CM

## 2019-08-23 DIAGNOSIS — R2681 Unsteadiness on feet: Secondary | ICD-10-CM

## 2019-08-23 DIAGNOSIS — R296 Repeated falls: Secondary | ICD-10-CM

## 2019-08-23 DIAGNOSIS — R2689 Other abnormalities of gait and mobility: Secondary | ICD-10-CM

## 2019-08-23 DIAGNOSIS — M25672 Stiffness of left ankle, not elsewhere classified: Secondary | ICD-10-CM

## 2019-08-23 DIAGNOSIS — M21372 Foot drop, left foot: Secondary | ICD-10-CM

## 2019-08-23 DIAGNOSIS — I69354 Hemiplegia and hemiparesis following cerebral infarction affecting left non-dominant side: Secondary | ICD-10-CM

## 2019-08-23 DIAGNOSIS — R293 Abnormal posture: Secondary | ICD-10-CM

## 2019-08-23 NOTE — Therapy (Signed)
[facility] [address] [place], [place], [address] Phone: [phone]   Fax:  [phone]  Physical Therapy Treatment  Patient Details  Name: Gregory Wiley MRN: [id number] Date of Birth: 24-Aug-1950 Referring Provider (PT): Felecia Shelling, Nanine Means, MD   Encounter Date: 08/23/2019  PT End of Session - 08/23/19 1439    Visit Number  22    Number of Visits  34    Date for PT Re-Evaluation  10/04/19    Authorization Type  [employer] Medicare - 10th visit PN    Progress Note Due on Visit  29    PT Start Time  1330    PT Stop Time  1415    PT Time Calculation (min)  45 min    Equipment Utilized During Treatment  Gait belt;Other (comment)   left AFO   Activity Tolerance  Patient limited by fatigue    Behavior During Therapy  [facility] for tasks assessed/performed       Past Medical History:  Diagnosis Date  . Adrenal insufficiency ([place])   . Anxiety   . Basal cell carcinoma of skin of left ankle   . Bleeding stomach ulcer 2015  . Coronary artery disease    stent, then CABG 07/20/10  . Daily headache    "for the past month" (11/11/2014)  . Depression   . Diverticulosis   . GERD (gastroesophageal reflux disease)   . Gout   . H/O cardiomyopathy    ischemic, now last echo 07/30/11, EF 38%W  . History of hiatal hernia   . Hyperlipidemia   . Hypertension   . Internal hemorrhoids   . Left hemiplegia ([place])   . NSTEMI (non-ST elevated myocardial infarction) ([place])    NSTEMI- last cath 06/2011-Stent to LCX-DES, last nuc 07/30/11 low risk  . Obesity (BMI 30-39.9)   . OSA on CPAP    since July 2013- uses CPAP sometimes , states he has lost 147 lbs. since stroke & doesn't use the CPAP as much as he use to.   . Plantar fasciitis of left foot   . Pneumonia    hosp. 12/2014  . Pulmonary embolism ([place]) 03/2014  . Stroke ([place]) 01/2014   "no control of LUE, can slightly LLE; memory problems since" (11/11/2014)  . Tubular adenoma of colon     Past  Surgical History:  Procedure Laterality Date  . BASAL CELL CARCINOMA EXCISION Left 2015   ankle  . CARDIAC SURGERY    . CATARACT EXTRACTION Bilateral   . COLONOSCOPY N/A 02/10/2013   Procedure: COLONOSCOPY;  Surgeon: Ladene Artist, MD;  Location: [facility];  Service: Endoscopy;  Laterality: N/A;  . CORONARY ANGIOPLASTY WITH STENT PLACEMENT  07/01/2011   DES-Resolute to native LCX  . CORONARY ANGIOPLASTY WITH STENT PLACEMENT  12/01/2007   COMPLEX 5 LESION PCI INCLUDING CUTTING BALLOON AND 4 CYPHER DESs  . CORONARY ARTERY BYPASS GRAFT  07/20/2010    LIMA-LAD; VG-ACUTE MARG of RCA; Seq VG-distal RCA & then pda  . EP IMPLANTABLE DEVICE N/A 02/14/2016   Procedure: Loop Recorder Removal;  Surgeon: Thompson Grayer, MD;  Location: [facility] CV LAB;  Service: Cardiovascular;  Laterality: N/A;  . ESOPHAGOGASTRODUODENOSCOPY  01/2014   gastric ulcer, erosive gastroduodenitis, H Pylori negative.   . ESOPHAGOGASTRODUODENOSCOPY (EGD) WITH PROPOFOL N/A 11/14/2014   Procedure: ESOPHAGOGASTRODUODENOSCOPY (EGD) WITH PROPOFOL;  Surgeon: Milus Banister, MD;  Location: [facility];  Service: Endoscopy;  Laterality: N/A;  . HAND SURGERY Right  to take glass out  . HERNIA REPAIR    . INTRAMEDULLARY (IM) NAIL INTERTROCHANTERIC Right 02/07/2015   Procedure: INTRAMEDULLARY (IM) NAIL INTERTROCHANTRIC RIGHT HIP;  Surgeon: Renette Butters, MD;  Location: [facility];  Service: Orthopedics;  Laterality: Right;  . KNEE ARTHROSCOPY Right   . LEFT HEART CATHETERIZATION WITH CORONARY/GRAFT ANGIOGRAM  07/01/2011   Procedure: LEFT HEART CATHETERIZATION WITH Beatrix Fetters;  Surgeon: Sanda Klein, MD;  Location: [facility] CATH LAB;  Service: Cardiovascular;;  . LOOP RECORDER IMPLANT  02/15/2014   MDT LINQ implanted by Dr Rayann Heman for cryptogenic stroke  . PERCUTANEOUS CORONARY STENT INTERVENTION (PCI-S) Right 07/01/2011   Procedure: PERCUTANEOUS CORONARY STENT INTERVENTION (PCI-S);  Surgeon: Sanda Klein, MD;  Location[facility]  CATH LAB;  Service: Cardiovascular;  Laterality: Right;  . PICC LINE PLACE PERIPHERAL ([place] HX)  06/2015  . RETINAL DETACHMENT SURGERY Right   . TEE WITHOUT CARDIOVERSION N/A 02/15/2014   Procedure: TRANSESOPHAGEAL ECHOCARDIOGRAM (TEE);  Surgeon: Josue Hector, MD;  Location: [facility] ENDOSCOPY;  Service: Cardiovascular;  Laterality: N/A;  . UMBILICAL HERNIA REPAIR  2006    There were no vitals filed for this visit.  Subjective Assessment - 08/23/19 1342    Subjective  Still wearing post-op shoe; asking about using Bioness with AFO.  Gets second Covid vaccine today; goes shoe shopping at [employer].    Patient is accompained by:  Family member   spouse   Pertinent History  DNR; anxiety & depression, CAD with CABG, diverticulosis, gout, HLD, HTN, left hemiplegia, NSTEMI (2013), morbid obesity, PE (2016), CVA (2015), tubular adenoma of colon, OSA w/CPAP, right hip fx w/ORIF (2016), iron deficiency anemia    Patient Stated Goals  to obtain a Bioness for home use; to return to PLOF of ambulating household distances and short community distances with quad cane                        [facility] Adult PT Treatment/Exercise - 08/23/19 1427      Transfers   Transfers  Sit to Stand;Stand to Sit    Sit to Stand  4: Min guard    Sit to Stand Details (indicate cue type and reason)  with use of parallel bars and cane; tactile cues to bring head to midline when performing sit > stand and assistance for placement of L foot under BOS    Stand to Sit  4: Min guard      Ambulation/Gait   Ambulation/Gait  Yes    Ambulation/Gait Assistance  4: Min assist    Ambulation/Gait Assistance Details  with AFO and post-op shoe donned; verbal and tactile cues to bring head to midline for improved upright trunk in midline; provided tactile and verbal cues for increased anterior and lateral weight shift to L during L stance for increased step length on R and cues for pt to initiate weight shift to RLE at  pelvis (not head) to improve ability to initiate L swing phase with anterior pelvic rotation.  Pt only able to ambulate 10 feet at a time due to fatigue and required intermittent assistance for full L foot clearance and step length.    Ambulation Distance (Feet)  10 Feet   x2   Assistive device  Small based quad cane;Other (Comment)    Gait Pattern  Step-to pattern;Step-through pattern;Decreased step length - left;Decreased stance time - left;Decreased stride length;Decreased hip/knee flexion - left;Decreased dorsiflexion - left;Lateral trunk lean to right;Trunk rotated posteriorly on left;Poor foot  clearance - left    Ambulation Surface  Level;Indoor    Pre-Gait Activities  Standing in parallel bars performed pre-gait training/NMR with taps to 2" step first focusing on weight shift, WB and stabilization through LLE when tapping with RLE x 3 reps + 5 reps; also focused on active weight shift to R through pelvis while maintaining in head in midline to improve activation of L swing phase with pelvic rotation for full L foot clearance to tap 2" step - performed 3 reps x 2 on L side.  Multiple sitting rest breaks due to fatigue.             PT Education - 08/23/19 1437    Education Details  Educated on different goals for use of AFO vs. Bioness.  Discussed how use of AFO assisted with maintaining ankle ROM and alignment and providing ankle with stability for WB during transfers and gait vs. Bioness which creates muscle activation during standing and gait for open and closed chain ankle DF.  Will be contacting Bioness representative to see if she can consult with therapist in person for problem solving how to achieve anterior tibialis activation with Bioness.    Person(s) Educated  Patient;Spouse    Methods  Explanation    Comprehension  Verbalized understanding       PT Short Term Goals - 08/17/19 2038      PT SHORT TERM GOAL #1   Title  Pt will be able to perform full HEP with wife's min A     Status  Revised    Target Date  09/05/19      PT SHORT TERM GOAL #2   Title  Pt will decrease TUG time with [facility] and AFO to </= 80 seconds with min A    Baseline  08/13/19: full TUG performed with score 108.56 sec's with [facility]QC, no AFO    Time  4    Period  Weeks    Status  Revised    Target Date  09/05/19      PT SHORT TERM GOAL #4   Title  Pt will improve gait velocity (over 10 foot distance) to >/= .5 ft/sec with cane and new AFO    Baseline  08/13/19: 0.35 ft/sec with cane, no AFO (has not been delivered to date), improved just not to goal level (was 0.17 ft/sec), no    Time  4    Period  Weeks    Status  Revised    Target Date  09/05/19      PT SHORT TERM GOAL #5   Title  Pt will consistently perform stand pivot transfers to L and R with cane and AFO with min A    Baseline  08/05/19: pt does not have AFO as of yet. Has been casted, waiting on delivery. does perform this with Bioness.    Time  4    Period  Weeks    Status  Revised    Target Date  09/05/19        PT Long Term Goals - 08/17/19 2042      PT LONG TERM GOAL #1   Title  Patient and wife demonstrate and verbalizes independence with final HEP    Baseline  difficulty finding exercises for pt to do at home, sits and sleeps in recliner; no access to bed    Time  12    Period  Weeks    Status  Revised    Target Date  10/04/19  PT LONG TERM GOAL #2   Title  Pt will improve TUG with cane and Bioness/AFO to </= 60 seconds    Baseline  108 seconds with cane and no AFO    Time  12    Period  Weeks    Status  Revised    Target Date  10/04/19      PT LONG TERM GOAL #3   Title  Patient will improve gait velocity over full distance with quad cane and most appropriate orthosis (bioness or AFO) to >0.8 ft/sec    Baseline  .35 over 10 foot distance with cane and no AFO    Time  12    Period  Weeks    Status  Revised    Target Date  10/04/19      PT LONG TERM GOAL #4   Title  Patient demonstrates improved L  passive ankle DF by at least 10 degrees.    Baseline  lacking 24 deg with knee extended, lacking 10 deg with knee flexed > Lacking 20 deg with knee extended, lacking 16 deg with knee flexed    Time  12    Period  Weeks    Status  Revised    Target Date  10/04/19      PT LONG TERM GOAL #5   Title  Pt will demonstrate ability to transfer w/c <> mat/chair with quad cane, most appropriate orthosis and supervision    Baseline  min-mod A with quad cane    Time  12    Period  Weeks    Status  Revised    Target Date  10/04/19            Plan - 08/23/19 1441    Clinical Impression Statement  Pt with decreased tolerance for standing activities and gait today due to poor sleep since change in Gabapentin dosing.  Continued to utilize new AFO for pre-gait and gait training with multiple seated rest breaks.  Pt will hopefully have a new shoe to wear with AFO next session.  Will continue to train with AFO and will put Bioness on hold until Bioness representative can consult with therapist and patient in person.  Pt and wife agreeable with plan.    Personal Factors and Comorbidities  Age;Fitness;Past/Current Experience;Comorbidity 3+;Time since onset of injury/illness/exacerbation    Comorbidities  anixety & depression, CAD, diverticulosis, gout, HLD, HTN, left hemiplegia, NSTEMI (2013), morbid obesity, PE (2016), CVA (2015), tubular adenoma of colon, OSA w/CPAP, right hip fx w/ORIF (2016), iron deficiency anemia    Examination-Activity Limitations  Squat;Stairs;Stand;Dressing;Transfers;Bed Mobility;Locomotion Level    Examination-Participation Restrictions  Church;Community Activity    Stability/Clinical Decision Making  Evolving/Moderate complexity    Rehab Potential  Good    PT Frequency  2x / week    PT Duration  12 weeks    PT Treatment/Interventions  ADLs/Self Care Home Management;Electrical Stimulation;DME Instruction;Balance training;Therapeutic exercise;Orthotic Fit/Training;Gait  training;Neuromuscular re-education;Stair training;Functional mobility training;Patient/family education;Therapeutic activities;Manual techniques;Compression bandaging;Passive range of motion    PT Next Visit Plan  Was he able to get a new pair of shoes?  Bioness is still on hold - I have emailed michelle to see if she can consult in person.  Continue standing tolerance and endurance, transfer training, weight shifting, gait training with new AFO.    Consulted and Agree with Plan of Care  Patient;Family member/caregiver    Family Member Consulted  spouse       Patient will benefit from skilled therapeutic intervention in  order to improve the following deficits and impairments:  Abnormal gait, Decreased range of motion, Difficulty walking, Impaired tone, Obesity, Impaired UE functional use, Decreased endurance, Decreased activity tolerance, Decreased skin integrity, Decreased balance, Decreased knowledge of use of DME, Impaired flexibility, Postural dysfunction, Impaired sensation, Increased edema, Decreased strength, Decreased mobility  Visit Diagnosis: Muscle weakness (generalized)  Foot drop, left  Unsteadiness on feet  Spastic hemiplegia of left nondominant side as late effect of cerebral infarction (HCC)  Other abnormalities of gait and mobility  Stiffness of left ankle, not elsewhere classified  Abnormal posture  Repeated falls     Problem List Patient Active Problem List   Diagnosis Date Noted  . Left foot drop 07/14/2019  . GERD (gastroesophageal reflux disease) 06/18/2019  . Tinea cruris 07/20/2017  . Hemorrhoid 07/20/2017  . Irritability 06/08/2017  . Fall at home 05/06/2017  . Advance care planning 11/28/2016  . Skin mass 11/28/2016  . Lower urinary tract symptoms (LUTS) 09/05/2016  . Hearing loss 09/05/2016  . Excessive daytime sleepiness 04/16/2016  . Exposure to TB 02/21/2016  . Vitamin D deficiency 02/21/2016  . Anemia 11/10/2015  . Left thigh pain  11/10/2015  . Neck pain 10/12/2015  . Hemiplegia affecting left side in left-dominant patient as late effect of cerebrovascular disease ([place]) 09/03/2015  . Medicare annual wellness visit, subsequent 08/14/2015  . Acute pulmonary embolism ([place]) 07/25/2015  . Chest wall pain 07/15/2015  . History of pulmonary embolism 07/15/2015  . Chronic anticoagulation 05/22/2015  . Gait disturbance 05/15/2015  . DNR (do not resuscitate) 01/19/2015  . Hypotension 01/14/2015  . Paresthesia   . Depression 12/14/2014  . OSA (obstructive sleep apnea) 11/30/2014  . Chronic combined systolic and diastolic congestive heart failure ([place]) 11/11/2014  . SOB (shortness of breath) 04/17/2014  . Hemiplegia affecting left dominant side ([place]) 02/16/2014  . 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 06/30/2011    [name], PT, DPT 08/23/19    2:46 PM    [facility] [address] [place], [place], [address] Phone: [phone]   Fax:  [phone]  Name: Gregory Wiley MRN: [id number] Date of Birth: 09-03-1950

## 2019-08-25 ENCOUNTER — Other Ambulatory Visit: Payer: Self-pay

## 2019-08-25 ENCOUNTER — Ambulatory Visit: Payer: Medicare Other | Admitting: Physical Therapy

## 2019-08-25 ENCOUNTER — Encounter: Payer: Self-pay | Admitting: Physical Therapy

## 2019-08-25 DIAGNOSIS — R293 Abnormal posture: Secondary | ICD-10-CM

## 2019-08-25 DIAGNOSIS — R296 Repeated falls: Secondary | ICD-10-CM

## 2019-08-25 DIAGNOSIS — R2681 Unsteadiness on feet: Secondary | ICD-10-CM

## 2019-08-25 DIAGNOSIS — M6281 Muscle weakness (generalized): Secondary | ICD-10-CM

## 2019-08-25 DIAGNOSIS — M21372 Foot drop, left foot: Secondary | ICD-10-CM

## 2019-08-25 DIAGNOSIS — I69354 Hemiplegia and hemiparesis following cerebral infarction affecting left non-dominant side: Secondary | ICD-10-CM

## 2019-08-25 DIAGNOSIS — M25672 Stiffness of left ankle, not elsewhere classified: Secondary | ICD-10-CM

## 2019-08-25 DIAGNOSIS — R2689 Other abnormalities of gait and mobility: Secondary | ICD-10-CM

## 2019-08-25 NOTE — Therapy (Signed)
[facility] [address] [place], [place], [address] Phone: [phone]   Fax:  [phone]  Physical Therapy Treatment  Patient Details  Name: Gregory Wiley MRN: [id number] Date of Birth: 1950/12/27 Referring Provider (PT): Felecia Shelling, Nanine Means, MD   Encounter Date: 08/25/2019  PT End of Session - 08/25/19 2207    Visit Number  23    Number of Visits  34    Date for PT Re-Evaluation  10/04/19    Authorization Type  [employer] Medicare - 10th visit PN    Progress Note Due on Visit  53    PT Start Time  1317    PT Stop Time  1402    PT Time Calculation (min)  45 min    Equipment Utilized During Treatment  Gait belt;Other (comment)   left AFO   Activity Tolerance  Patient limited by fatigue    Behavior During Therapy  [facility] for tasks assessed/performed       Past Medical History:  Diagnosis Date  . Adrenal insufficiency ([place])   . Anxiety   . Basal cell carcinoma of skin of left ankle   . Bleeding stomach ulcer 2015  . Coronary artery disease    stent, then CABG 07/20/10  . Daily headache    "for the past month" (11/11/2014)  . Depression   . Diverticulosis   . GERD (gastroesophageal reflux disease)   . Gout   . H/O cardiomyopathy    ischemic, now last echo 07/30/11, EF 38%W  . History of hiatal hernia   . Hyperlipidemia   . Hypertension   . Internal hemorrhoids   . Left hemiplegia ([place])   . NSTEMI (non-ST elevated myocardial infarction) ([place])    NSTEMI- last cath 06/2011-Stent to LCX-DES, last nuc 07/30/11 low risk  . Obesity (BMI 30-39.9)   . OSA on CPAP    since July 2013- uses CPAP sometimes , states he has lost 147 lbs. since stroke & doesn't use the CPAP as much as he use to.   . Plantar fasciitis of left foot   . Pneumonia    hosp. 12/2014  . Pulmonary embolism ([place]) 03/2014  . Stroke ([place]) 01/2014   "no control of LUE, can slightly LLE; memory problems since" (11/11/2014)  . Tubular adenoma of colon     Past  Surgical History:  Procedure Laterality Date  . BASAL CELL CARCINOMA EXCISION Left 2015   ankle  . CARDIAC SURGERY    . CATARACT EXTRACTION Bilateral   . COLONOSCOPY N/A 02/10/2013   Procedure: COLONOSCOPY;  Surgeon: Ladene Artist, MD;  Location: [facility] ENDOSCOPY;  Service: Endoscopy;  Laterality: N/A;  . CORONARY ANGIOPLASTY WITH STENT PLACEMENT  07/01/2011   DES-Resolute to native LCX  . CORONARY ANGIOPLASTY WITH STENT PLACEMENT  12/01/2007   COMPLEX 5 LESION PCI INCLUDING CUTTING BALLOON AND 4 CYPHER DESs  . CORONARY ARTERY BYPASS GRAFT  07/20/2010    LIMA-LAD; VG-ACUTE MARG of RCA; Seq VG-distal RCA & then pda  . EP IMPLANTABLE DEVICE N/A 02/14/2016   Procedure: Loop Recorder Removal;  Surgeon: Thompson Grayer, MD;  Location: [facility] CV LAB;  Service: Cardiovascular;  Laterality: N/A;  . ESOPHAGOGASTRODUODENOSCOPY  01/2014   gastric ulcer, erosive gastroduodenitis, H Pylori negative.   . ESOPHAGOGASTRODUODENOSCOPY (EGD) WITH PROPOFOL N/A 11/14/2014   Procedure: ESOPHAGOGASTRODUODENOSCOPY (EGD) WITH PROPOFOL;  Surgeon: Milus Banister, MD;  Location: [facility];  Service: Endoscopy;  Laterality: N/A;  . HAND SURGERY Right  to take glass out  . HERNIA REPAIR    . INTRAMEDULLARY (IM) NAIL INTERTROCHANTERIC Right 02/07/2015   Procedure: INTRAMEDULLARY (IM) NAIL INTERTROCHANTRIC RIGHT HIP;  Surgeon: Renette Butters, MD;  Location: [facility];  Service: Orthopedics;  Laterality: Right;  . KNEE ARTHROSCOPY Right   . LEFT HEART CATHETERIZATION WITH CORONARY/GRAFT ANGIOGRAM  07/01/2011   Procedure: LEFT HEART CATHETERIZATION WITH Beatrix Fetters;  Surgeon: Sanda Klein, MD;  Location: [facility] CATH LAB;  Service: Cardiovascular;;  . LOOP RECORDER IMPLANT  02/15/2014   MDT LINQ implanted by Dr Rayann Heman for cryptogenic stroke  . PERCUTANEOUS CORONARY STENT INTERVENTION (PCI-S) Right 07/01/2011   Procedure: PERCUTANEOUS CORONARY STENT INTERVENTION (PCI-S);  Surgeon: Sanda Klein, MD;  Location[facility]  CATH LAB;  Service: Cardiovascular;  Laterality: Right;  . PICC LINE PLACE PERIPHERAL ([place] HX)  06/2015  . RETINAL DETACHMENT SURGERY Right   . TEE WITHOUT CARDIOVERSION N/A 02/15/2014   Procedure: TRANSESOPHAGEAL ECHOCARDIOGRAM (TEE);  Surgeon: Josue Hector, MD;  Location: [facility] ENDOSCOPY;  Service: Cardiovascular;  Laterality: N/A;  . UMBILICAL HERNIA REPAIR  2006    There were no vitals filed for this visit.  Subjective Assessment - 08/25/19 1341    Subjective  Arm is sore from vaccine.  A little depressed, does not have enough active mm activation for UE FES -asking about doing OT again.  Went to [employer] and they had to order a 6E shoe for the L foot.    Patient is accompained by:  Family member   spouse   Pertinent History  DNR; anxiety & depression, CAD with CABG, diverticulosis, gout, HLD, HTN, left hemiplegia, NSTEMI (2013), morbid obesity, PE (2016), CVA (2015), tubular adenoma of colon, OSA w/CPAP, right hip fx w/ORIF (2016), iron deficiency anemia    Patient Stated Goals  to obtain a Bioness for home use; to return to PLOF of ambulating household distances and short community distances with quad cane    Currently in Pain?  No/denies                        [facility] Adult PT Treatment/Exercise - 08/25/19 1652      Transfers   Transfers  Sit to Stand;Stand to Sit    Sit to Stand  4: Min guard    Sit to Stand Details (indicate cue type and reason)  use of parallel bars, therapist provided cues to bring head to midline for increased use of LLE during sit > stand    Stand to Sit  4: Min guard    Stand to Sit Details  pt continued to fatigue quickly and required multiple sitting rest breaks in // bars      Ambulation/Gait   Ambulation/Gait  Yes    Ambulation/Gait Assistance  4: Min assist    Ambulation/Gait Assistance Details  with AFO and post op shoe, focused on carry over of NMR, active weight shifting, more upright trunk, increased stance time on LLE and  increased step and stride length bilaterally    Ambulation Distance (Feet)  13 Feet    Assistive device  Small based quad cane    Gait Pattern  Step-through pattern;Decreased step length - left;Decreased stance time - left;Decreased stride length;Decreased hip/knee flexion - left;Decreased dorsiflexion - left;Lateral trunk lean to right;Decreased trunk rotation    Ambulation Surface  Level;Indoor      Therapeutic Activites    Therapeutic Activities  Other Therapeutic Activities    Other Therapeutic Activities  Provided pt  and wife with significant amount of education regarding LUE function and effectiveness of OT at this point in his recovery.  Pt is resistant to Botox and demonstrates increased spasticity in LUE and when pt participated in OT before they were unable to get active movement in his LUE.  PT to discuss with OT to see if referral would be beneficial.  Discussed role of compensatory strategies and focus on overall function this far out from CVA.  Pt also mentioned use of power wheelchair to improve mobility independence.  Wife does not feel pt would be safe with a power wheelchair - pt has second hand Jazzy at home and has tipped it over and runs into objects.  Discussed that if pt wanted to pursue a power wheelchair then it would require an evaluation with PT and ATP and would require extensive training for safety.  Will continue to discuss and address as needed.  Discussed having Bioness clinical specialist consult virtually at PT session next week to problem solve how to elicit ankle DF contraction.  Pt agreeable to virtual consultation.  Pt asking if he should be wearing his compression sock 24/7; educated pt that sock should be on when up during the day but removed at night when sleeping.  Pt to let wife know.      Neuro Re-ed    Neuro Re-ed Details   with UE support on // bars continued to focus on active weight shifting to R for increased LLE step length and foot clearance.  Placed R  foot on 2" step and performed active weight shift and extension through RLE with anterior pelvic rotation on L to initiate swing phase.  Required multiple seated rest breaks.  Tactile cues for weight shift through trunk and pelvis while maintaining head in midline.               PT Education - 08/25/19 2207    Education Details  see TA    Person(s) Educated  Patient;Spouse    Methods  Explanation    Comprehension  Verbalized understanding       PT Short Term Goals - 08/17/19 2038      PT SHORT TERM GOAL #1   Title  Pt will be able to perform full HEP with wife's min A    Status  Revised    Target Date  09/05/19      PT SHORT TERM GOAL #2   Title  Pt will decrease TUG time with [facility] and AFO to </= 80 seconds with min A    Baseline  08/13/19: full TUG performed with score 108.56 sec's with [facility]QC, no AFO    Time  4    Period  Weeks    Status  Revised    Target Date  09/05/19      PT SHORT TERM GOAL #4   Title  Pt will improve gait velocity (over 10 foot distance) to >/= .5 ft/sec with cane and new AFO    Baseline  08/13/19: 0.35 ft/sec with cane, no AFO (has not been delivered to date), improved just not to goal level (was 0.17 ft/sec), no    Time  4    Period  Weeks    Status  Revised    Target Date  09/05/19      PT SHORT TERM GOAL #5   Title  Pt will consistently perform stand pivot transfers to L and R with cane and AFO with min A    Baseline  08/05/19: pt does not have AFO as of yet. Has been casted, waiting on delivery. does perform this with Bioness.    Time  4    Period  Weeks    Status  Revised    Target Date  09/05/19        PT Long Term Goals - 08/17/19 2042      PT LONG TERM GOAL #1   Title  Patient and wife demonstrate and verbalizes independence with final HEP    Baseline  difficulty finding exercises for pt to do at home, sits and sleeps in recliner; no access to bed    Time  12    Period  Weeks    Status  Revised    Target Date  10/04/19      PT  LONG TERM GOAL #2   Title  Pt will improve TUG with cane and Bioness/AFO to </= 60 seconds    Baseline  108 seconds with cane and no AFO    Time  12    Period  Weeks    Status  Revised    Target Date  10/04/19      PT LONG TERM GOAL #3   Title  Patient will improve gait velocity over full distance with quad cane and most appropriate orthosis (bioness or AFO) to >0.8 ft/sec    Baseline  .35 over 10 foot distance with cane and no AFO    Time  12    Period  Weeks    Status  Revised    Target Date  10/04/19      PT LONG TERM GOAL #4   Title  Patient demonstrates improved L passive ankle DF by at least 10 degrees.    Baseline  lacking 24 deg with knee extended, lacking 10 deg with knee flexed > Lacking 20 deg with knee extended, lacking 16 deg with knee flexed    Time  12    Period  Weeks    Status  Revised    Target Date  10/04/19      PT LONG TERM GOAL #5   Title  Pt will demonstrate ability to transfer w/c <> mat/chair with quad cane, most appropriate orthosis and supervision    Baseline  min-mod A with quad cane    Time  12    Period  Weeks    Status  Revised    Target Date  10/04/19            Plan - 08/25/19 2208    Clinical Impression Statement  Pt continues to demonstrate increased fatigue and decreased activity tolerance but pt able to continue to participate in NMR and gait training with AFO.  Pt has ordered large shoe for LLE and AFO.  Pt continues to pursue functional e-stim for LUE and LLE activation and training.  PT to discuss with OT and to set up virtual consult with B[employer]ess clinical specialist.  Will continue to address and progress as pt is able to tolerate.    Personal Factors and Comorbidities  Age;Fitness;Past/Current Experience;Comorbidity 3+;Time since onset of injury/illness/exacerbation    Comorbidities  anixety & depression, CAD, diverticulosis, gout, HLD, HTN, left hemiplegia, NSTEMI (2013), morbid obesity, PE (2016), CVA (2015), tubular adenoma of  colon, OSA w/CPAP, right hip fx w/ORIF (2016), iron deficiency anemia    Examination-Activity Limitations  Squat;Stairs;Stand;Dressing;Transfers;Bed Mobility;Locomotion Level    Examination-Participation Restrictions  Church;Community Activity    Stability/Clinical Decision Making  Evolving/Moderate complexity    Rehab Potential  Good    PT Frequency  2x / week    PT Duration  12 weeks    PT Treatment/Interventions  ADLs/Self Care Home Management;Electrical Stimulation;DME Instruction;Balance training;Therapeutic exercise;Orthotic Fit/Training;Gait training;Neuromuscular re-education;Stair training;Functional mobility training;Patient/family education;Therapeutic activities;Manual techniques;Compression bandaging;Passive range of motion    PT Next Visit Plan  Discuss what OT said (OT if need splint but not for FES); Bioness consult on 6/16.  Continue standing tolerance and endurance, weight shifting, gait training with new AFO.  Power w/c trial - did he have scooter or power w/c?    Consulted and Agree with Plan of Care  Patient;Family member/caregiver    Family Member Consulted  spouse       Patient will benefit from skilled therapeutic intervention in order to improve the following deficits and impairments:  Abnormal gait, Decreased range of motion, Difficulty walking, Impaired tone, Obesity, Impaired UE functional use, Decreased endurance, Decreased activity tolerance, Decreased skin integrity, Decreased balance, Decreased knowledge of use of DME, Impaired flexibility, Postural dysfunction, Impaired sensation, Increased edema, Decreased strength, Decreased mobility  Visit Diagnosis: Muscle weakness (generalized)  Foot drop, left  Unsteadiness on feet  Spastic hemiplegia of left nondominant side as late effect of cerebral infarction (HCC)  Other abnormalities of gait and mobility  Stiffness of left ankle, not elsewhere classified  Abnormal posture  Repeated falls     Problem  List Patient Active Problem List   Diagnosis Date Noted  . Left foot drop 07/14/2019  . GERD (gastroesophageal reflux disease) 06/18/2019  . Tinea cruris 07/20/2017  . Hemorrhoid 07/20/2017  . Irritability 06/08/2017  . Fall at home 05/06/2017  . Advance care planning 11/28/2016  . Skin mass 11/28/2016  . Lower urinary tract symptoms (LUTS) 09/05/2016  . Hearing loss 09/05/2016  . Excessive daytime sleepiness 04/16/2016  . Exposure to TB 02/21/2016  . Vitamin D deficiency 02/21/2016  . Anemia 11/10/2015  . Left thigh pain 11/10/2015  . Neck pain 10/12/2015  . Hemiplegia affecting left side in left-dominant patient as late effect of cerebrovascular disease ([place]) 09/03/2015  . Medicare annual wellness visit, subsequent 08/14/2015  . Acute pulmonary embolism ([place]) 07/25/2015  . Chest wall pain 07/15/2015  . History of pulmonary embolism 07/15/2015  . Chronic anticoagulation 05/22/2015  . Gait disturbance 05/15/2015  . DNR (do not resuscitate) 01/19/2015  . Hypotension 01/14/2015  . Paresthesia   . Depression 12/14/2014  . OSA (obstructive sleep apnea) 11/30/2014  . Chronic combined systolic and diastolic congestive heart failure ([place]) 11/11/2014  . SOB (shortness of breath) 04/17/2014  . Hemiplegia affecting left dominant side ([place]) 02/16/2014  . 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 06/30/2011    [name], PT, DPT 08/25/19    10:13 PM    [facility] [address] [place], [place], [address] Phone: [phone]   Fax:  [phone]  Name: Gregory Wiley MRN: [id number] Date of Birth: 1950/09/30

## 2019-08-30 ENCOUNTER — Ambulatory Visit: Payer: Medicare Other | Admitting: Physical Therapy

## 2019-08-30 ENCOUNTER — Other Ambulatory Visit: Payer: Self-pay

## 2019-08-30 ENCOUNTER — Other Ambulatory Visit: Payer: Self-pay | Admitting: Family Medicine

## 2019-08-30 ENCOUNTER — Encounter: Payer: Self-pay | Admitting: Physical Therapy

## 2019-08-30 DIAGNOSIS — R2681 Unsteadiness on feet: Secondary | ICD-10-CM

## 2019-08-30 DIAGNOSIS — M25672 Stiffness of left ankle, not elsewhere classified: Secondary | ICD-10-CM

## 2019-08-30 DIAGNOSIS — R2689 Other abnormalities of gait and mobility: Secondary | ICD-10-CM

## 2019-08-30 DIAGNOSIS — M6281 Muscle weakness (generalized): Secondary | ICD-10-CM | POA: Diagnosis not present

## 2019-08-30 DIAGNOSIS — R293 Abnormal posture: Secondary | ICD-10-CM

## 2019-08-30 DIAGNOSIS — I69354 Hemiplegia and hemiparesis following cerebral infarction affecting left non-dominant side: Secondary | ICD-10-CM

## 2019-08-30 DIAGNOSIS — R296 Repeated falls: Secondary | ICD-10-CM

## 2019-08-30 DIAGNOSIS — M21372 Foot drop, left foot: Secondary | ICD-10-CM

## 2019-08-30 NOTE — Therapy (Signed)
[facility] [address] [place], [place], [address] Phone: [phone]   Fax:  [phone]  Physical Therapy Treatment  Patient Details  Name: Gregory Wiley MRN: [id number] Date of Birth: [birth year] Referring Provider (PT): Felecia Shelling, Nanine Means, MD   Encounter Date: 08/30/2019   PT End of Session - 08/30/19 1451    Visit Number 24    Number of Visits 34    Date for PT Re-Evaluation 10/04/19    Authorization Type [employer] Medicare - 10th visit PN    Progress Note Due on Visit 29    PT Start Time 1330    PT Stop Time 1415    PT Time Calculation (min) 45 min    Equipment Utilized During Treatment Gait belt;Other (comment)   left AFO   Activity Tolerance Patient limited by fatigue    Behavior During Therapy [facility] for tasks assessed/performed           Past Medical History:  Diagnosis Date  . Adrenal insufficiency ([place])   . Anxiety   . Basal cell carcinoma of skin of left ankle   . Bleeding stomach ulcer 2015  . Coronary artery disease    stent, then CABG 07/20/10  . Daily headache    "for the past month" (11/11/2014)  . Depression   . Diverticulosis   . GERD (gastroesophageal reflux disease)   . Gout   . H/O cardiomyopathy    ischemic, now last echo 07/30/11, EF 38%W  . History of hiatal hernia   . Hyperlipidemia   . Hypertension   . Internal hemorrhoids   . Left hemiplegia ([place])   . NSTEMI (non-ST elevated myocardial infarction) ([place])    NSTEMI- last cath 06/2011-Stent to LCX-DES, last nuc 07/30/11 low risk  . Obesity (BMI 30-39.9)   . OSA on CPAP    since July 2013- uses CPAP sometimes , states he has lost 147 lbs. since stroke & doesn't use the CPAP as much as he use to.   . Plantar fasciitis of left foot   . Pneumonia    hosp. 12/2014  . Pulmonary embolism ([place]) 03/2014  . Stroke ([place]) 01/2014   "no control of LUE, can slightly LLE; memory problems since" (11/11/2014)  . Tubular adenoma of colon     Past  Surgical History:  Procedure Laterality Date  . BASAL CELL CARCINOMA EXCISION Left 2015   ankle  . CARDIAC SURGERY    . CATARACT EXTRACTION Bilateral   . COLONOSCOPY N/A 02/10/2013   Procedure: COLONOSCOPY;  Surgeon: Ladene Artist, MD;  Location: [facility] ENDOSCOPY;  Service: Endoscopy;  Laterality: N/A;  . CORONARY ANGIOPLASTY WITH STENT PLACEMENT  07/01/2011   DES-Resolute to native LCX  . CORONARY ANGIOPLASTY WITH STENT PLACEMENT  12/01/2007   COMPLEX 5 LESION PCI INCLUDING CUTTING BALLOON AND 4 CYPHER DESs  . CORONARY ARTERY BYPASS GRAFT  07/20/2010    LIMA-LAD; VG-ACUTE MARG of RCA; Seq VG-distal RCA & then pda  . EP IMPLANTABLE DEVICE N/A 02/14/2016   Procedure: Loop Recorder Removal;  Surgeon: Thompson Grayer, MD;  Location: [facility] CV LAB;  Service: Cardiovascular;  Laterality: N/A;  . ESOPHAGOGASTRODUODENOSCOPY  01/2014   gastric ulcer, erosive gastroduodenitis, H Pylori negative.   . ESOPHAGOGASTRODUODENOSCOPY (EGD) WITH PROPOFOL N/A 11/14/2014   Procedure: ESOPHAGOGASTRODUODENOSCOPY (EGD) WITH PROPOFOL;  Surgeon: Milus Banister, MD;  Location: [facility];  Service: Endoscopy;  Laterality: N/A;  . HAND SURGERY Right    to take glass out  .  HERNIA REPAIR    . INTRAMEDULLARY (IM) NAIL INTERTROCHANTERIC Right 02/07/2015   Procedure: INTRAMEDULLARY (IM) NAIL INTERTROCHANTRIC RIGHT HIP;  Surgeon: Renette Butters, MD;  Location: [facility];  Service: Orthopedics;  Laterality: Right;  . KNEE ARTHROSCOPY Right   . LEFT HEART CATHETERIZATION WITH CORONARY/GRAFT ANGIOGRAM  07/01/2011   Procedure: LEFT HEART CATHETERIZATION WITH Beatrix Fetters;  Surgeon: Sanda Klein, MD;  Location: [facility] CATH LAB;  Service: Cardiovascular;;  . LOOP RECORDER IMPLANT  02/15/2014   MDT LINQ implanted by Dr Rayann Heman for cryptogenic stroke  . PERCUTANEOUS CORONARY STENT INTERVENTION (PCI-S) Right 07/01/2011   Procedure: PERCUTANEOUS CORONARY STENT INTERVENTION (PCI-S);  Surgeon: Sanda Klein, MD;  Location: [facility]  CATH LAB;  Service: Cardiovascular;  Laterality: Right;  . PICC LINE PLACE PERIPHERAL ([place] HX)  06/2015  . RETINAL DETACHMENT SURGERY Right   . TEE WITHOUT CARDIOVERSION N/A 02/15/2014   Procedure: TRANSESOPHAGEAL ECHOCARDIOGRAM (TEE);  Surgeon: Josue Hector, MD;  Location: [facility] ENDOSCOPY;  Service: Cardiovascular;  Laterality: N/A;  . UMBILICAL HERNIA REPAIR  2006    There were no vitals filed for this visit.   Subjective Assessment - 08/30/19 1337    Subjective Wife has a bad migraine today.  Pt did not have any significant side effects from vaccine.  Shoe store is having a hard time ordering a large shoe for him.  Informed pt of what OT reported back to PT about therapy for LUE.    Patient is accompained by: Family member   spouse   Pertinent History DNR; anxiety & depression, CAD with CABG, diverticulosis, gout, HLD, HTN, left hemiplegia, NSTEMI (2013), morbid obesity, PE (2016), CVA (2015), tubular adenoma of colon, OSA w/CPAP, right hip fx w/ORIF (2016), iron deficiency anemia    Patient Stated Goals to obtain a Bioness for home use; to return to PLOF of ambulating household distances and short community distances with quad cane                             [facility] Adult PT Treatment/Exercise - 08/30/19 1455      Bed Mobility   Bed Mobility Right Sidelying to Sit;Sit to Sidelying Right    Right Sidelying to Sit Maximal Assistance - Patient 25-49%    Sit to Sidelying Right Moderate Assistance - Patient 50-74%      Transfers   Transfers Sit to Stand;Stand to Sit;Stand Pivot Transfers    Sit to Stand 4: Min guard    Sit to Stand Details (indicate cue type and reason) from transport chair and mat with therapist providing tactile cues to bring head back to midline and to keep COG moving forwards over BOS during sit > stand    Stand to Sit 4: Min guard    Stand Pivot Transfers 4: Min guard    Stand Pivot Transfer Details (indicate cue type and reason) no real assistance  required, therapist provided cues for weight shifting and WB through LLE and for side stepping to R prior to sitting      Ambulation/Gait   Ambulation/Gait Yes    Ambulation/Gait Assistance 4: Min assist    Ambulation/Gait Assistance Details with AFO and post-op shoe with therapist only providing facilitation at head to bring head in midline, for full weight shift anteriorly and laterally to L during L stance to increase stance phase time and verbal cues for increased anterior pelvic rotation with hip flexion to initiate L swing phase.  Required one sitting  rest break between sets due to fatigue    Ambulation Distance (Feet) 13 Feet   x 2   Assistive device Small based quad cane    Gait Pattern Step-through pattern;Decreased step length - left;Decreased stance time - left;Decreased stride length;Decreased hip/knee flexion - left;Lateral trunk lean to right;Decreased trunk rotation;Step-to pattern;Decreased arm swing - left;Decreased step length - right;Decreased weight shift to left;Decreased dorsiflexion - left;Trunk rotated posteriorly on left    Ambulation Surface Level;Indoor      Therapeutic Activites    Therapeutic Activities Other Therapeutic Activities    Other Therapeutic Activities Discussed with pt PT's discussion with OT regarding further OT services.  OT would be willing to see pt to set pt up with resting hand splint for positioning and ROM but did not believe there would be value and OT for AROM/strengthening in preparation for FES use.        Exercises   Exercises Other Exercises    Other Exercises  Prior to gait - in sitting performed head lateral leans to L followed by trunk lateral leans to left sliding LUE down outer L lower leg for elongation of R side - performed x 2.  Also performed isolated, slow stretch of R upper trap and anterior shoulder with therapist assisting with progressive cervical side flexion to L and shoulder retraction x 30 seconds      Knee/Hip Exercises:  Sidelying   Hip ADduction Strengthening;Left;1 set;5 reps    Hip ADduction Limitations pillow between knees - pillow squeezes to begin to activate hip IR muscles and stretch posterior hip muscles    Clams after pillow squeezes added in foot lift with pillow squeezes for reverse clams and focus on hip IR strengthening: 1 set x 8 reps    Other Sidelying Knee/Hip Exercises Gravity minimized hip flexion <> extension with therapist assisting to support LLE in ABD while pt performed active hip flex <> ext, 2 sets x 10 reps.    Other Sidelying Knee/Hip Exercises Isolated pelvic rotations on L side forwards and then backwards against therapist's manual resistance; therapist also placed other hand on ribs for counter rotation for dissociation between ribs and pelvis.  Performed 10 reps                  PT Education - 08/30/19 1509    Education Details see TA    Person(s) Educated Patient    Methods Explanation    Comprehension Verbalized understanding            PT Short Term Goals - 08/17/19 2038      PT SHORT TERM GOAL #1   Title Pt will be able to perform full HEP with wife's min A    Status Revised    Target Date 09/05/19      PT SHORT TERM GOAL #2   Title Pt will decrease TUG time with [facility] and AFO to </= 80 seconds with min A    Baseline 08/13/19: full TUG performed with score 108.56 sec's with LBQC, no AFO    Time 4    Period Weeks    Status Revised    Target Date 09/05/19      PT SHORT TERM GOAL #4   Title Pt will improve gait velocity (over 10 foot distance) to >/= .5 ft/sec with cane and new AFO    Baseline 08/13/19: 0.35 ft/sec with cane, no AFO (has not been delivered to date), improved just not to goal level (was 0.17 ft/sec), no  Time 4    Period Weeks    Status Revised    Target Date 09/05/19      PT SHORT TERM GOAL #5   Title Pt will consistently perform stand pivot transfers to L and R with cane and AFO with min A    Baseline 08/05/19: pt does not have AFO  as of yet. Has been casted, waiting on delivery. does perform this with Bioness.    Time 4    Period Weeks    Status Revised    Target Date 09/05/19             PT Long Term Goals - 08/17/19 2042      PT LONG TERM GOAL #1   Title Patient and wife demonstrate and verbalizes independence with final HEP    Baseline difficulty finding exercises for pt to do at home, sits and sleeps in recliner; no access to bed    Time 12    Period Weeks    Status Revised    Target Date 10/04/19      PT LONG TERM GOAL #2   Title Pt will improve TUG with cane and Bioness/AFO to </= 60 seconds    Baseline 108 seconds with cane and no AFO    Time 12    Period Weeks    Status Revised    Target Date 10/04/19      PT LONG TERM GOAL #3   Title Patient will improve gait velocity over full distance with quad cane and most appropriate orthosis (bioness or AFO) to >0.8 ft/sec    Baseline .35 over 10 foot distance with cane and no AFO    Time 12    Period Weeks    Status Revised    Target Date 10/04/19      PT LONG TERM GOAL #4   Title Patient demonstrates improved L passive ankle DF by at least 10 degrees.    Baseline lacking 24 deg with knee extended, lacking 10 deg with knee flexed > Lacking 20 deg with knee extended, lacking 16 deg with knee flexed    Time 12    Period Weeks    Status Revised    Target Date 10/04/19      PT LONG TERM GOAL #5   Title Pt will demonstrate ability to transfer w/c <> mat/chair with quad cane, most appropriate orthosis and supervision    Baseline min-mod A with quad cane    Time 12    Period Weeks    Status Revised    Target Date 10/04/19                 Plan - 08/30/19 1509    Clinical Impression Statement Returned to gravity minimized position of R sidelying to continue to focus on L hip ROM and proximal hip strengthening.  Pt noted to have improved tolerance of L hip stretches and improved ROM for hip IR and improved strength for active hip flexion  with knee flexion.  Pt continues to have increased difficulty wtih carryover to standing and gait and continues to fatigue quickly - requiring a seated rest break after ambulating >10'.  Will continue to address and progress towards LTG.    Personal Factors and Comorbidities Age;Fitness;Past/Current Experience;Comorbidity 3+;Time since onset of injury/illness/exacerbation    Comorbidities anixety & depression, CAD, diverticulosis, gout, HLD, HTN, left hemiplegia, NSTEMI (2013), morbid obesity, PE (2016), CVA (2015), tubular adenoma of colon, OSA w/CPAP, right hip fx w/ORIF (2016), iron deficiency  anemia    Examination-Activity Limitations Squat;Stairs;Stand;Dressing;Transfers;Bed Mobility;Locomotion Level    Examination-Participation Restrictions Church;Community Activity    Stability/Clinical Decision Making Evolving/Moderate complexity    Rehab Potential Good    PT Frequency 2x / week    PT Duration 12 weeks    PT Treatment/Interventions ADLs/Self Care Home Management;Electrical Stimulation;DME Instruction;Balance training;Therapeutic exercise;Orthotic Fit/Training;Gait training;Neuromuscular re-education;Stair training;Functional mobility training;Patient/family education;Therapeutic activities;Manual techniques;Compression bandaging;Passive range of motion    PT Next Visit Plan Bioness consult on 6/16.  Continue standing tolerance and endurance, weight shifting, gait training with new AFO.  Power w/c trial and safety assessment?  R sidelying for L hip ROM and strengthening    Consulted and Agree with Plan of Care Patient;Family member/caregiver    Family Member Consulted spouse           Patient will benefit from skilled therapeutic intervention in order to improve the following deficits and impairments:  Abnormal gait, Decreased range of motion, Difficulty walking, Impaired tone, Obesity, Impaired UE functional use, Decreased endurance, Decreased activity tolerance, Decreased skin integrity,  Decreased balance, Decreased knowledge of use of DME, Impaired flexibility, Postural dysfunction, Impaired sensation, Increased edema, Decreased strength, Decreased mobility  Visit Diagnosis: Muscle weakness (generalized)  Repeated falls  Foot drop, left  Unsteadiness on feet  Spastic hemiplegia of left nondominant side as late effect of cerebral infarction (HCC)  Other abnormalities of gait and mobility  Stiffness of left ankle, not elsewhere classified  Abnormal posture     Problem List Patient Active Problem List   Diagnosis Date Noted  . Left foot drop 07/14/2019  . GERD (gastroesophageal reflux disease) 06/18/2019  . Tinea cruris 07/20/2017  . Hemorrhoid 07/20/2017  . Irritability 06/08/2017  . Fall at home 05/06/2017  . Advance care planning 11/28/2016  . Skin mass 11/28/2016  . Lower urinary tract symptoms (LUTS) 09/05/2016  . Hearing loss 09/05/2016  . Excessive daytime sleepiness 04/16/2016  . Exposure to TB 02/21/2016  . Vitamin D deficiency 02/21/2016  . Anemia 11/10/2015  . Left thigh pain 11/10/2015  . Neck pain 10/12/2015  . Hemiplegia affecting left side in left-dominant patient as late effect of cerebrovascular disease ([place]) 09/03/2015  . Medicare annual wellness visit, subsequent 08/14/2015  . Acute pulmonary embolism ([place]) 07/25/2015  . Chest wall pain 07/15/2015  . History of pulmonary embolism 07/15/2015  . Chronic anticoagulation 05/22/2015  . Gait disturbance 05/15/2015  . DNR (do not resuscitate) 01/19/2015  . Hypotension 01/14/2015  . Paresthesia   . Depression 12/14/2014  . OSA (obstructive sleep apnea) 11/30/2014  . Chronic combined systolic and diastolic congestive heart failure ([place]) 11/11/2014  . SOB (shortness of breath) 04/17/2014  . Hemiplegia affecting left dominant side ([place]) 02/16/2014  . 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 06/30/2011    [name], PT,  DPT 08/30/19    3:13 PM    [facility] [address] [place], [place], [address] Phone: [phone]   Fax:  [phone]  Name: AKAI DOLLARD MRN: [id number] Date of Birth: 01-Aug-1950

## 2019-09-01 ENCOUNTER — Ambulatory Visit: Payer: Medicare Other | Admitting: Physical Therapy

## 2019-09-06 ENCOUNTER — Other Ambulatory Visit: Payer: Self-pay

## 2019-09-06 ENCOUNTER — Ambulatory Visit: Payer: Medicare Other | Admitting: Physical Therapy

## 2019-09-06 DIAGNOSIS — R293 Abnormal posture: Secondary | ICD-10-CM

## 2019-09-06 DIAGNOSIS — R2689 Other abnormalities of gait and mobility: Secondary | ICD-10-CM

## 2019-09-06 DIAGNOSIS — R2681 Unsteadiness on feet: Secondary | ICD-10-CM

## 2019-09-06 DIAGNOSIS — M21372 Foot drop, left foot: Secondary | ICD-10-CM

## 2019-09-06 DIAGNOSIS — M6281 Muscle weakness (generalized): Secondary | ICD-10-CM

## 2019-09-06 DIAGNOSIS — R296 Repeated falls: Secondary | ICD-10-CM

## 2019-09-06 DIAGNOSIS — M25672 Stiffness of left ankle, not elsewhere classified: Secondary | ICD-10-CM

## 2019-09-06 DIAGNOSIS — I69354 Hemiplegia and hemiparesis following cerebral infarction affecting left non-dominant side: Secondary | ICD-10-CM

## 2019-09-06 NOTE — Therapy (Signed)
[facility] [address] [place], [place], [address] Phone: [phone]   Fax:  [phone]  Physical Therapy Treatment  Patient Details  Name: Gregory Wiley MRN: [id number] Date of Birth: 05/22/1950 Referring Provider (PT): Felecia Shelling, Nanine Means, MD   Encounter Date: 09/06/2019   PT End of Session - 09/06/19 1459    Visit Number 25    Number of Visits 34    Date for PT Re-Evaluation 10/04/19    Authorization Type [employer] Medicare - 10th visit PN    Progress Note Due on Visit 53    PT Start Time 1315    PT Stop Time 1400    PT Time Calculation (min) 45 min    Equipment Utilized During Treatment Gait belt;Other (comment)   left AFO   Activity Tolerance Patient limited by fatigue    Behavior During Therapy [facility] for tasks assessed/performed           Past Medical History:  Diagnosis Date  . Adrenal insufficiency ([place])   . Anxiety   . Basal cell carcinoma of skin of left ankle   . Bleeding stomach ulcer 2015  . Coronary artery disease    stent, then CABG 07/20/10  . Daily headache    "for the past month" (11/11/2014)  . Depression   . Diverticulosis   . GERD (gastroesophageal reflux disease)   . Gout   . H/O cardiomyopathy    ischemic, now last echo 07/30/11, EF 38%W  . History of hiatal hernia   . Hyperlipidemia   . Hypertension   . Internal hemorrhoids   . Left hemiplegia ([place])   . NSTEMI (non-ST elevated myocardial infarction) ([place])    NSTEMI- last cath 06/2011-Stent to LCX-DES, last nuc 07/30/11 low risk  . Obesity (BMI 30-39.9)   . OSA on CPAP    since July 2013- uses CPAP sometimes , states he has lost 147 lbs. since stroke & doesn't use the CPAP as much as he use to.   . Plantar fasciitis of left foot   . Pneumonia    hosp. 12/2014  . Pulmonary embolism ([place]) 03/2014  . Stroke ([place]) 01/2014   "no control of LUE, can slightly LLE; memory problems since" (11/11/2014)  . Tubular adenoma of colon     Past  Surgical History:  Procedure Laterality Date  . BASAL CELL CARCINOMA EXCISION Left 2015   ankle  . CARDIAC SURGERY    . CATARACT EXTRACTION Bilateral   . COLONOSCOPY N/A 02/10/2013   Procedure: COLONOSCOPY;  Surgeon: Ladene Artist, MD;  Location: [facility] ENDOSCOPY;  Service: Endoscopy;  Laterality: N/A;  . CORONARY ANGIOPLASTY WITH STENT PLACEMENT  07/01/2011   DES-Resolute to native LCX  . CORONARY ANGIOPLASTY WITH STENT PLACEMENT  12/01/2007   COMPLEX 5 LESION PCI INCLUDING CUTTING BALLOON AND 4 CYPHER DESs  . CORONARY ARTERY BYPASS GRAFT  07/20/2010    LIMA-LAD; VG-ACUTE MARG of RCA; Seq VG-distal RCA & then pda  . EP IMPLANTABLE DEVICE N/A 02/14/2016   Procedure: Loop Recorder Removal;  Surgeon: Thompson Grayer, MD;  Location: [facility] CV LAB;  Service: Cardiovascular;  Laterality: N/A;  . ESOPHAGOGASTRODUODENOSCOPY  01/2014   gastric ulcer, erosive gastroduodenitis, H Pylori negative.   . ESOPHAGOGASTRODUODENOSCOPY (EGD) WITH PROPOFOL N/A 11/14/2014   Procedure: ESOPHAGOGASTRODUODENOSCOPY (EGD) WITH PROPOFOL;  Surgeon: Milus Banister, MD;  Location: [facility];  Service: Endoscopy;  Laterality: N/A;  . HAND SURGERY Right    to take glass out  .  HERNIA REPAIR    . INTRAMEDULLARY (IM) NAIL INTERTROCHANTERIC Right 02/07/2015   Procedure: INTRAMEDULLARY (IM) NAIL INTERTROCHANTRIC RIGHT HIP;  Surgeon: Renette Butters, MD;  Location: [facility];  Service: Orthopedics;  Laterality: Right;  . KNEE ARTHROSCOPY Right   . LEFT HEART CATHETERIZATION WITH CORONARY/GRAFT ANGIOGRAM  07/01/2011   Procedure: LEFT HEART CATHETERIZATION WITH Beatrix Fetters;  Surgeon: Sanda Klein, MD;  Location: [facility] CATH LAB;  Service: Cardiovascular;;  . LOOP RECORDER IMPLANT  02/15/2014   MDT LINQ implanted by Dr Rayann Heman for cryptogenic stroke  . PERCUTANEOUS CORONARY STENT INTERVENTION (PCI-S) Right 07/01/2011   Procedure: PERCUTANEOUS CORONARY STENT INTERVENTION (PCI-S);  Surgeon: Sanda Klein, MD;  Location: [facility]  CATH LAB;  Service: Cardiovascular;  Laterality: Right;  . PICC LINE PLACE PERIPHERAL ([place] HX)  06/2015  . RETINAL DETACHMENT SURGERY Right   . TEE WITHOUT CARDIOVERSION N/A 02/15/2014   Procedure: TRANSESOPHAGEAL ECHOCARDIOGRAM (TEE);  Surgeon: Josue Hector, MD;  Location[facility] ENDOSCOPY;  Service: Cardiovascular;  Laterality: N/A;  . UMBILICAL HERNIA REPAIR  2006    There were no vitals filed for this visit.   Subjective Assessment - 09/06/19 1324    Subjective Had to cancel last session due to stomach issues and diarrhea.  Feeling fatigued today.  Will reschedule virtual session with Bioness to next week.  [employer]et is still working on getting a larger shoe for L foot.    Patient is accompained by: Family member   spouse   Pertinent History DNR; anxiety & depression, CAD with CABG, diverticulosis, gout, HLD, HTN, left hemiplegia, NSTEMI (2013), morbid obesity, PE (2016), CVA (2015), tubular adenoma of colon, OSA w/CPAP, right hip fx w/ORIF (2016), iron deficiency anemia    Patient Stated Goals to obtain a Bioness for home use; to return to PLOF of ambulating household distances and short community distances with quad cane    Currently in Pain? No/denies                             [facility] Adult PT Treatment/Exercise - 09/06/19 1451      Bed Mobility   Bed Mobility Rolling Right;Right Sidelying to Sit;Sit to Supine    Rolling Right Minimal Assistance - Patient > 75%    Right Sidelying to Sit Moderate Assistance - Patient 50-74%    Sit to Supine Moderate Assistance - Patient 50-74%      Transfers   Transfers Sit to Stand;Stand to Sit;Stand Pivot Transfers    Sit to Stand 4: Min guard    Sit to Stand Details (indicate cue type and reason) light assistance to tilt head to L and bring COG forwards over BOS    Stand to Sit 4: Min guard    Stand Pivot Transfers 4: Min guard    Stand Pivot Transfer Details (indicate cue type and reason) with cane, therapist providing min  guard for safety only, no assistance needed for weight shifting or balance      Ambulation/Gait   Ambulation/Gait Yes    Ambulation/Gait Assistance 4: Min assist    Ambulation/Gait Assistance Details with AFO and post op shoe; demonstrated improved upright posture today and anterior rotation of L pelvis during L swing phase; pt still fatigued quickly    Ambulation Distance (Feet) 13 Feet    Assistive device Small based quad cane    Gait Pattern Step-through pattern;Decreased step length - left;Decreased stance time - left;Decreased stride length;Decreased hip/knee flexion - left;Lateral trunk  lean to right;Decreased trunk rotation;Step-to pattern;Decreased arm swing - left;Decreased step length - right;Decreased weight shift to left;Decreased dorsiflexion - left;Trunk rotated posteriorly on left    Ambulation Surface Level;Indoor      Exercises   Exercises Other Exercises    Other Exercises  Transitioned exercises pt was performing in sidelying to supine for increased resistance from gravity: with bilat LE in hip and knee flexion performed 5 reps lower trunk rotation to L and R gradually increasing amount of rotation to allow gradual stretch of L hip and of low back; keeping feet on mat performed 10 reps towel squeezes with towel roll between knees focusing on active LLE adduction and internal rotation; with breathing sequence performed 2 sets x 5 reps each alternating LE hip flexion marches with contralateral LE stabilization pushing down into mat; performed 5 reps posterior pelvic tilts with breathing sequence and then added in bridging with pelvic tilts x 5 reps with cues to maintain core activation when bridging up to avoid increased use of back extensors.                  PT Education - 09/06/19 1458    Education Details will assess STG next session; alerted wife to PT's discussion with OT about restarting OT for LUE, will reschedule Bioness consult for next Monday    Person(s)  Educated Patient;Spouse    Methods Explanation    Comprehension Verbalized understanding            PT Short Term Goals - 08/17/19 2038      PT SHORT TERM GOAL #1   Title Pt will be able to perform full HEP with wife's min A    Status Revised    Target Date 09/05/19      PT SHORT TERM GOAL #2   Title Pt will decrease TUG time with [facility] and AFO to </= 80 seconds with min A    Baseline 08/13/19: full TUG performed with score 108.56 sec's with LBQC, no AFO    Time 4    Period Weeks    Status Revised    Target Date 09/05/19      PT SHORT TERM GOAL #4   Title Pt will improve gait velocity (over 10 foot distance) to >/= .5 ft/sec with cane and new AFO    Baseline 08/13/19: 0.35 ft/sec with cane, no AFO (has not been delivered to date), improved just not to goal level (was 0.17 ft/sec), no    Time 4    Period Weeks    Status Revised    Target Date 09/05/19      PT SHORT TERM GOAL #5   Title Pt will consistently perform stand pivot transfers to L and R with cane and AFO with min A    Baseline 08/05/19: pt does not have AFO as of yet. Has been casted, waiting on delivery. does perform this with Bioness.    Time 4    Period Weeks    Status Revised    Target Date 09/05/19             PT Long Term Goals - 08/17/19 2042      PT LONG TERM GOAL #1   Title Patient and wife demonstrate and verbalizes independence with final HEP    Baseline difficulty finding exercises for pt to do at home, sits and sleeps in recliner; no access to bed    Time 12    Period Weeks    Status Revised  Target Date 10/04/19      PT LONG TERM GOAL #2   Title Pt will improve TUG with cane and Bioness/AFO to </= 60 seconds    Baseline 108 seconds with cane and no AFO    Time 12    Period Weeks    Status Revised    Target Date 10/04/19      PT LONG TERM GOAL #3   Title Patient will improve gait velocity over full distance with quad cane and most appropriate orthosis (bioness or AFO) to >0.8 ft/sec     Baseline .35 over 10 foot distance with cane and no AFO    Time 12    Period Weeks    Status Revised    Target Date 10/04/19      PT LONG TERM GOAL #4   Title Patient demonstrates improved L passive ankle DF by at least 10 degrees.    Baseline lacking 24 deg with knee extended, lacking 10 deg with knee flexed > Lacking 20 deg with knee extended, lacking 16 deg with knee flexed    Time 12    Period Weeks    Status Revised    Target Date 10/04/19      PT LONG TERM GOAL #5   Title Pt will demonstrate ability to transfer w/c <> mat/chair with quad cane, most appropriate orthosis and supervision    Baseline min-mod A with quad cane    Time 12    Period Weeks    Status Revised    Target Date 10/04/19                 Plan - 09/06/19 1500    Clinical Impression Statement Pt too fatigued to perform STG assessment today; will perform at next session.  Returned to mat but progressed mat exercises by performing in supine with increased resistance from gravity and added in breathing sequence.  Pt demonstrated significant improvement in hip ROM and trunk rotation for bed mobility and gait but pt continues to fatigue quickly with gait.  Will assess STG next session.    Personal Factors and Comorbidities Age;Fitness;Past/Current Experience;Comorbidity 3+;Time since onset of injury/illness/exacerbation    Comorbidities anixety & depression, CAD, diverticulosis, gout, HLD, HTN, left hemiplegia, NSTEMI (2013), morbid obesity, PE (2016), CVA (2015), tubular adenoma of colon, OSA w/CPAP, right hip fx w/ORIF (2016), iron deficiency anemia    Examination-Activity Limitations Squat;Stairs;Stand;Dressing;Transfers;Bed Mobility;Locomotion Level    Examination-Participation Restrictions Church;Community Activity    Stability/Clinical Decision Making Evolving/Moderate complexity    Rehab Potential Good    PT Frequency 2x / week    PT Duration 12 weeks    PT Treatment/Interventions ADLs/Self Care Home  Management;Electrical Stimulation;DME Instruction;Balance training;Therapeutic exercise;Orthotic Fit/Training;Gait training;Neuromuscular re-education;Stair training;Functional mobility training;Patient/family education;Therapeutic activities;Manual techniques;Compression bandaging;Passive range of motion    PT Next Visit Plan CHECK STG.  Bioness consult moved to 6/28.  Mat exercises in supine.  Continue standing tolerance and endurance, weight shifting, gait training with new AFO.  Power w/c trial and safety assessment?  R sidelying for L hip ROM and strengthening    Consulted and Agree with Plan of Care Patient;Family member/caregiver    Family Member Consulted spouse           Patient will benefit from skilled therapeutic intervention in order to improve the following deficits and impairments:  Abnormal gait, Decreased range of motion, Difficulty walking, Impaired tone, Obesity, Impaired UE functional use, Decreased endurance, Decreased activity tolerance, Decreased skin integrity, Decreased balance, Decreased knowledge of  use of DME, Impaired flexibility, Postural dysfunction, Impaired sensation, Increased edema, Decreased strength, Decreased mobility  Visit Diagnosis: Muscle weakness (generalized)  Abnormal posture  Repeated falls  Foot drop, left  Unsteadiness on feet  Spastic hemiplegia of left nondominant side as late effect of cerebral infarction (HCC)  Other abnormalities of gait and mobility  Stiffness of left ankle, not elsewhere classified     Problem List Patient Active Problem List   Diagnosis Date Noted  . Left foot drop 07/14/2019  . GERD (gastroesophageal reflux disease) 06/18/2019  . Tinea cruris 07/20/2017  . Hemorrhoid 07/20/2017  . Irritability 06/08/2017  . Fall at home 05/06/2017  . Advance care planning 11/28/2016  . Skin mass 11/28/2016  . Lower urinary tract symptoms (LUTS) 09/05/2016  . Hearing loss 09/05/2016  . Excessive daytime sleepiness  04/16/2016  . Exposure to TB 02/21/2016  . Vitamin D deficiency 02/21/2016  . Anemia 11/10/2015  . Left thigh pain 11/10/2015  . Neck pain 10/12/2015  . Hemiplegia affecting left side in left-dominant patient as late effect of cerebrovascular disease ([place]) 09/03/2015  . Medicare annual wellness visit, subsequent 08/14/2015  . Acute pulmonary embolism ([place]) 07/25/2015  . Chest wall pain 07/15/2015  . History of pulmonary embolism 07/15/2015  . Chronic anticoagulation 05/22/2015  . Gait disturbance 05/15/2015  . DNR (do not resuscitate) 01/19/2015  . Hypotension 01/14/2015  . Paresthesia   . Depression 12/14/2014  . OSA (obstructive sleep apnea) 11/30/2014  . Chronic combined systolic and diastolic congestive heart failure ([place]) 11/11/2014  . SOB (shortness of breath) 04/17/2014  . Hemiplegia affecting left dominant side ([place]) 02/16/2014  . 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 06/30/2011    Gregory Wiley, PT, DPT 09/06/19    3:03 PM    [facility] [address] [place], [place], [address] Phone: [phone]   Fax:  [phone]  Name: Gregory Wiley MRN: [id number] Date of Birth: 1950-07-09

## 2019-09-08 ENCOUNTER — Other Ambulatory Visit: Payer: Self-pay

## 2019-09-08 ENCOUNTER — Ambulatory Visit: Payer: Medicare Other | Admitting: Physical Therapy

## 2019-09-08 ENCOUNTER — Encounter: Payer: Self-pay | Admitting: Physical Therapy

## 2019-09-08 DIAGNOSIS — I69354 Hemiplegia and hemiparesis following cerebral infarction affecting left non-dominant side: Secondary | ICD-10-CM

## 2019-09-08 DIAGNOSIS — M25672 Stiffness of left ankle, not elsewhere classified: Secondary | ICD-10-CM

## 2019-09-08 DIAGNOSIS — R293 Abnormal posture: Secondary | ICD-10-CM

## 2019-09-08 DIAGNOSIS — R2681 Unsteadiness on feet: Secondary | ICD-10-CM

## 2019-09-08 DIAGNOSIS — M6281 Muscle weakness (generalized): Secondary | ICD-10-CM | POA: Diagnosis not present

## 2019-09-08 DIAGNOSIS — M21372 Foot drop, left foot: Secondary | ICD-10-CM

## 2019-09-08 DIAGNOSIS — R2689 Other abnormalities of gait and mobility: Secondary | ICD-10-CM

## 2019-09-08 DIAGNOSIS — R296 Repeated falls: Secondary | ICD-10-CM

## 2019-09-08 NOTE — Therapy (Signed)
[facility] [address] [place], [place], [address] Phone: [phone]   Fax:  [phone]  Physical Therapy Treatment  Patient Details  Name: Gregory Wiley MRN: [id number] Date of Birth: 08-28-1950 Referring Provider (PT): Epimenio Foot, Pearletha Furl, MD   Encounter Date: 09/08/2019   PT End of Session - 09/08/19 1716    Visit Number 26    Number of Visits 34    Date for PT Re-Evaluation 10/04/19    Authorization Type [employer] Medicare - 10th visit PN    Progress Note Due on Visit 29    PT Start Time 1325    PT Stop Time 1405    PT Time Calculation (min) 40 min    Equipment Utilized During Treatment Gait belt;Other (comment)   Bioness   Activity Tolerance Patient limited by fatigue    Behavior During Therapy [facility] for tasks assessed/performed           Past Medical History:  Diagnosis Date  . Adrenal insufficiency (HCC)   . Anxiety   . Basal cell carcinoma of skin of left ankle   . Bleeding stomach ulcer 2015  . Coronary artery disease    stent, then CABG 07/20/10  . Daily headache    "for the past month" (11/11/2014)  . Depression   . Diverticulosis   . GERD (gastroesophageal reflux disease)   . Gout   . H/O cardiomyopathy    ischemic, now last echo 07/30/11, EF 38%W  . History of hiatal hernia   . Hyperlipidemia   . Hypertension   . Internal hemorrhoids   . Left hemiplegia (HCC)   . NSTEMI (non-ST elevated myocardial infarction) (HCC)    NSTEMI- last cath 06/2011-Stent to LCX-DES, last nuc 07/30/11 low risk  . Obesity (BMI 30-39.9)   . OSA on CPAP    since July 2013- uses CPAP sometimes , states he has lost 147 lbs. since stroke & doesn't use the CPAP as much as he use to.   . Plantar fasciitis of left foot   . Pneumonia    hosp. 12/2014  . Pulmonary embolism (HCC) 03/2014  . Stroke (HCC) 01/2014   "no control of LUE, can slightly LLE; memory problems since" (11/11/2014)  . Tubular adenoma of colon     Past  Surgical History:  Procedure Laterality Date  . BASAL CELL CARCINOMA EXCISION Left 2015   ankle  . CARDIAC SURGERY    . CATARACT EXTRACTION Bilateral   . COLONOSCOPY N/A 02/10/2013   Procedure: COLONOSCOPY;  Surgeon: Meryl Dare, MD;  Location: [facility] ENDOSCOPY;  Service: Endoscopy;  Laterality: N/A;  . CORONARY ANGIOPLASTY WITH STENT PLACEMENT  07/01/2011   DES-Resolute to native LCX  . CORONARY ANGIOPLASTY WITH STENT PLACEMENT  12/01/2007   COMPLEX 5 LESION PCI INCLUDING CUTTING BALLOON AND 4 CYPHER DESs  . CORONARY ARTERY BYPASS GRAFT  07/20/2010    LIMA-LAD; VG-ACUTE MARG of RCA; Seq VG-distal RCA & then pda  . EP IMPLANTABLE DEVICE N/A 02/14/2016   Procedure: Loop Recorder Removal;  Surgeon: Hillis Range, MD;  Location: [facility] INVASIVE CV LAB;  Service: Cardiovascular;  Laterality: N/A;  . ESOPHAGOGASTRODUODENOSCOPY  01/2014   gastric ulcer, erosive gastroduodenitis, H Pylori negative.   . ESOPHAGOGASTRODUODENOSCOPY (EGD) WITH PROPOFOL N/A 11/14/2014   Procedure: ESOPHAGOGASTRODUODENOSCOPY (EGD) WITH PROPOFOL;  Surgeon: Rachael Fee, MD;  Location: [facility] ENDOSCOPY;  Service: Endoscopy;  Laterality: N/A;  . HAND SURGERY Right    to take glass out  . HERNIA  REPAIR    . INTRAMEDULLARY (IM) NAIL INTERTROCHANTERIC Right 02/07/2015   Procedure: INTRAMEDULLARY (IM) NAIL INTERTROCHANTRIC RIGHT HIP;  Surgeon: Renette Butters, MD;  Location: [place];  Service: Orthopedics;  Laterality: Right;  . KNEE ARTHROSCOPY Right   . LEFT HEART CATHETERIZATION WITH CORONARY/GRAFT ANGIOGRAM  07/01/2011   Procedure: LEFT HEART CATHETERIZATION WITH Beatrix Fetters;  Surgeon: Sanda Klein, MD;  Location: [place] CATH LAB;  Service: Cardiovascular;;  . LOOP RECORDER IMPLANT  02/15/2014   MDT LINQ implanted by Dr Rayann Heman for cryptogenic stroke  . PERCUTANEOUS CORONARY STENT INTERVENTION (PCI-S) Right 07/01/2011   Procedure: PERCUTANEOUS CORONARY STENT INTERVENTION (PCI-S);  Surgeon: Sanda Klein, MD;  Location: [facility]  CATH LAB;  Service: Cardiovascular;  Laterality: Right;  . PICC LINE PLACE PERIPHERAL ([place] HX)  06/2015  . RETINAL DETACHMENT SURGERY Right   . TEE WITHOUT CARDIOVERSION N/A 02/15/2014   Procedure: TRANSESOPHAGEAL ECHOCARDIOGRAM (TEE);  Surgeon: Josue Hector, MD;  Location: [facility] ENDOSCOPY;  Service: Cardiovascular;  Laterality: N/A;  . UMBILICAL HERNIA REPAIR  2006    There were no vitals filed for this visit.   Subjective Assessment - 09/08/19 1329    Subjective Pt had a rough afternoon on Monday after leaving PT; still fatigued today - not feeling any better.    Patient is accompained by: Family member   spouse   Pertinent History DNR; anxiety & depression, CAD with CABG, diverticulosis, gout, HLD, HTN, left hemiplegia, NSTEMI (2013), morbid obesity, PE (2016), CVA (2015), tubular adenoma of colon, OSA w/CPAP, right hip fx w/ORIF (2016), iron deficiency anemia    Patient Stated Goals to obtain a Bioness for home use; to return to PLOF of ambulating household distances and short community distances with quad cane    Currently in Pain? No/denies              [facility] PT Assessment - 09/08/19 1359      Standardized Balance Assessment   Standardized Balance Assessment Timed Up and Go Test;10 meter walk test    10 Meter Walk 96 seconds over 18 feet: .18 ft/sec with cane and Bioness, did not bring AFO today      Timed Up and Go Test   TUG Normal TUG    Normal TUG (seconds) 96    TUG Comments with quad cane and Bioness, did not bring AFO today.  During last few steps pt experienced increased tone in LLE and therapist had to position foot in safe position to WB.  First attempt Bioness caused increase in tone; second attempt performed without Bioness turned on.                         [place] Adult PT Treatment/Exercise - 09/08/19 1408      Transfers   Transfers Sit to Stand;Stand to Sit    Sit to Stand 4: Min guard    Sit to Stand Details (indicate cue type and reason)  assistance for anterior weight shift    Stand to Sit 5: Supervision      Ambulation/Gait   Ambulation/Gait Yes    Ambulation/Gait Assistance 4: Min assist    Ambulation/Gait Assistance Details attempted with Bioness again with gel electrodes but had to turn off due increased spasticity.    Ambulation Distance (Feet) 20 Feet    Assistive device Small based quad cane    Gait Pattern Step-to pattern;Step-through pattern;Decreased step length - left;Decreased stance time - left;Decreased stride length;Decreased hip/knee flexion - left;Decreased dorsiflexion -  left;Trunk rotated posteriorly on left;Poor foot clearance - left;Narrow base of support    Ambulation Surface Level;Indoor      Modalities   Modalities Teacher, English as a foreign language Location L anterior tibialis and L quad muscles    Electrical Stimulation Action continued to attempt to elicit contraction in L anterior tib for closed and open chain ankle DF and in quad for closed and open chain knee extension.  Unable to elicit a contraction in either muscle today; caused increased spasticity in LLE during gait    Electrical Stimulation Parameters see tablet 1; round gel electrodes today    Electrical Stimulation Goals Strength;Tone;Edema;Neuromuscular facilitation                  PT Education - 09/08/19 1716    Education Details progress made, Bioness increasing spasticity today    Person(s) Educated Patient    Methods Explanation    Comprehension Verbalized understanding            PT Short Term Goals - 09/08/19 1719      PT SHORT TERM GOAL #1   Title Pt will be able to perform full HEP with wife's min A    Status On-going    Target Date 09/05/19      PT SHORT TERM GOAL #2   Title Pt will decrease TUG time with [facility] and AFO to </= 80 seconds with min A    Baseline 96 seconds with quad cane and Bioness; will reassess with AFO    Time 4    Period Weeks    Status  Partially Met    Target Date 09/05/19      PT SHORT TERM GOAL #4   Title Pt will improve gait velocity (over 10 foot distance) to >/= .5 ft/sec with cane and new AFO    Baseline .17 ft/sec    Time 4    Period Weeks    Status Not Met    Target Date 09/05/19      PT SHORT TERM GOAL #5   Title Pt will consistently perform stand pivot transfers to L and R with cane and AFO with min A    Time 4    Period Weeks    Status Achieved    Target Date 09/05/19             PT Long Term Goals - 08/17/19 2042      PT LONG TERM GOAL #1   Title Patient and wife demonstrate and verbalizes independence with final HEP    Baseline difficulty finding exercises for pt to do at home, sits and sleeps in recliner; no access to bed    Time 12    Period Weeks    Status Revised    Target Date 10/04/19      PT LONG TERM GOAL #2   Title Pt will improve TUG with cane and Bioness/AFO to </= 60 seconds    Baseline 108 seconds with cane and no AFO    Time 12    Period Weeks    Status Revised    Target Date 10/04/19      PT LONG TERM GOAL #3   Title Patient will improve gait velocity over full distance with quad cane and most appropriate orthosis (bioness or AFO) to >0.8 ft/sec    Baseline .35 over 10 foot distance with cane and no AFO    Time 12    Period  Weeks    Status Revised    Target Date 10/04/19      PT LONG TERM GOAL #4   Title Patient demonstrates improved L passive ankle DF by at least 10 degrees.    Baseline lacking 24 deg with knee extended, lacking 10 deg with knee flexed > Lacking 20 deg with knee extended, lacking 16 deg with knee flexed    Time 12    Period Weeks    Status Revised    Target Date 10/04/19      PT LONG TERM GOAL #5   Title Pt will demonstrate ability to transfer w/c <> mat/chair with quad cane, most appropriate orthosis and supervision    Baseline min-mod A with quad cane    Time 12    Period Weeks    Status Revised    Target Date 10/04/19                  Plan - 09/08/19 1717    Clinical Impression Statement Pt still fatigued today but willing to perform STG assessment.  Wife forgot AFO so therapist attempted to set up Bioness again but still unable to elicit a contraction in L anterior tib and no contraction in quad today; Bioness appears to be increasing patient's spasticity when attempting TUG.  Turned off Bioness and re-assessed TUG and gait velocity with pt's time decreasing by 10 seconds.  When pt is wearing AFO, will re-assess again to see if AFO improves time.  Monday will perform virtual consult with Bioness.    Personal Factors and Comorbidities Age;Fitness;Past/Current Experience;Comorbidity 3+;Time since onset of injury/illness/exacerbation    Comorbidities anixety & depression, CAD, diverticulosis, gout, HLD, HTN, left hemiplegia, NSTEMI (2013), morbid obesity, PE (2016), CVA (2015), tubular adenoma of colon, OSA w/CPAP, right hip fx w/ORIF (2016), iron deficiency anemia    Examination-Activity Limitations Squat;Stairs;Stand;Dressing;Transfers;Bed Mobility;Locomotion Level    Examination-Participation Restrictions Church;Community Activity    Stability/Clinical Decision Making Evolving/Moderate complexity    Rehab Potential Good    PT Frequency 2x / week    PT Duration 12 weeks    PT Treatment/Interventions ADLs/Self Care Home Management;Electrical Stimulation;DME Instruction;Balance training;Therapeutic exercise;Orthotic Fit/Training;Gait training;Neuromuscular re-education;Stair training;Functional mobility training;Patient/family education;Therapeutic activities;Manual techniques;Compression bandaging;Passive range of motion    PT Next Visit Plan Recheck TUG with AFO.  Bioness consult moved to 6/28.  Mat exercises in supine.  Continue standing tolerance and endurance, weight shifting, gait training with new AFO.  Power w/c trial and safety assessment?  R sidelying for L hip ROM and strengthening    Consulted and Agree with  Plan of Care Patient;Family member/caregiver    Family Member Consulted spouse           Patient will benefit from skilled therapeutic intervention in order to improve the following deficits and impairments:  Abnormal gait, Decreased range of motion, Difficulty walking, Impaired tone, Obesity, Impaired UE functional use, Decreased endurance, Decreased activity tolerance, Decreased skin integrity, Decreased balance, Decreased knowledge of use of DME, Impaired flexibility, Postural dysfunction, Impaired sensation, Increased edema, Decreased strength, Decreased mobility  Visit Diagnosis: Muscle weakness (generalized)  Stiffness of left ankle, not elsewhere classified  Abnormal posture  Repeated falls  Foot drop, left  Unsteadiness on feet  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 Left foot drop 07/14/2019  . GERD (gastroesophageal reflux disease)  06/18/2019  . Tinea cruris 07/20/2017  . Hemorrhoid 07/20/2017  . Irritability 06/08/2017  . Fall at home 05/06/2017  . Advance care planning 11/28/2016  . Skin mass 11/28/2016  . Lower urinary tract symptoms (LUTS) 09/05/2016  . Hearing loss 09/05/2016  . Excessive daytime sleepiness 04/16/2016  . Exposure to TB 02/21/2016  . Vitamin D deficiency 02/21/2016  . Anemia 11/10/2015  . Left thigh pain 11/10/2015  . Neck pain 10/12/2015  . Hemiplegia affecting left side in left-dominant patient as late effect of cerebrovascular disease ([place]) 09/03/2015  . Medicare annual wellness visit, subsequent 08/14/2015  . Acute pulmonary embolism ([place]) 07/25/2015  . Chest wall pain 07/15/2015  . History of pulmonary embolism 07/15/2015  . Chronic anticoagulation 05/22/2015  . Gait disturbance 05/15/2015  . DNR (do not resuscitate) 01/19/2015  . Hypotension 01/14/2015  .  Paresthesia   . Depression 12/14/2014  . OSA (obstructive sleep apnea) 11/30/2014  . Chronic combined systolic and diastolic congestive heart failure ([place]) 11/11/2014  . SOB (shortness of breath) 04/17/2014  . Hemiplegia affecting left dominant side ([place]) 02/16/2014  . 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 06/30/2011    [name], PT, DPT 09/08/19    5:21 PM    [facility] [address] [place], [place], [address] Phone: [phone]   Fax:  [phone]  Name: ZAINE ELSASS MRN: [id number] Date of Birth: Apr 03, 1950

## 2019-09-13 ENCOUNTER — Ambulatory Visit: Payer: Medicare Other | Admitting: Physical Therapy

## 2019-09-13 ENCOUNTER — Other Ambulatory Visit: Payer: Self-pay

## 2019-09-13 DIAGNOSIS — R2681 Unsteadiness on feet: Secondary | ICD-10-CM

## 2019-09-13 DIAGNOSIS — R296 Repeated falls: Secondary | ICD-10-CM

## 2019-09-13 DIAGNOSIS — I69354 Hemiplegia and hemiparesis following cerebral infarction affecting left non-dominant side: Secondary | ICD-10-CM

## 2019-09-13 DIAGNOSIS — M21372 Foot drop, left foot: Secondary | ICD-10-CM

## 2019-09-13 DIAGNOSIS — M6281 Muscle weakness (generalized): Secondary | ICD-10-CM | POA: Diagnosis not present

## 2019-09-13 DIAGNOSIS — R293 Abnormal posture: Secondary | ICD-10-CM

## 2019-09-13 DIAGNOSIS — R2689 Other abnormalities of gait and mobility: Secondary | ICD-10-CM

## 2019-09-13 DIAGNOSIS — M25672 Stiffness of left ankle, not elsewhere classified: Secondary | ICD-10-CM

## 2019-09-13 NOTE — Therapy (Signed)
[facility] [address] [place], [place], [address] Phone: [phone]   Fax:  [phone]  Physical Therapy Treatment  Patient Details  Name: Gregory Wiley MRN: [id number] Date of Birth: [birth year] Referring Provider (PT): Felecia Shelling, Nanine Means, MD   Encounter Date: 09/13/2019   PT End of Session - 09/13/19 1427    Visit Number 27    Number of Visits 34    Date for PT Re-Evaluation 10/04/19    Authorization Type [employer] Medicare - 10th visit PN    Progress Note Due on Visit 27    PT Start Time 1315    PT Stop Time 1400    PT Time Calculation (min) 45 min    Equipment Utilized During Treatment Gait belt;Other (comment)   Bioness   Activity Tolerance Patient limited by fatigue    Behavior During Therapy [facility] for tasks assessed/performed           Past Medical History:  Diagnosis Date  . Adrenal insufficiency ([place])   . Anxiety   . Basal cell carcinoma of skin of left ankle   . Bleeding stomach ulcer 2015  . Coronary artery disease    stent, then CABG 07/20/10  . Daily headache    "for the past month" (11/11/2014)  . Depression   . Diverticulosis   . GERD (gastroesophageal reflux disease)   . Gout   . H/O cardiomyopathy    ischemic, now last echo 07/30/11, EF 38%W  . History of hiatal hernia   . Hyperlipidemia   . Hypertension   . Internal hemorrhoids   . Left hemiplegia ([place])   . NSTEMI (non-ST elevated myocardial infarction) ([place])    NSTEMI- last cath 06/2011-Stent to LCX-DES, last nuc 07/30/11 low risk  . Obesity (BMI 30-39.9)   . OSA on CPAP    since July 2013- uses CPAP sometimes , states he has lost 147 lbs. since stroke & doesn't use the CPAP as much as he use to.   . Plantar fasciitis of left foot   . Pneumonia    hosp. 12/2014  . Pulmonary embolism ([place]) 03/2014  . Stroke ([place]) 01/2014   "no control of LUE, can slightly LLE; memory problems since" (11/11/2014)  . Tubular adenoma of colon     Past  Surgical History:  Procedure Laterality Date  . BASAL CELL CARCINOMA EXCISION Left 2015   ankle  . CARDIAC SURGERY    . CATARACT EXTRACTION Bilateral   . COLONOSCOPY N/A 02/10/2013   Procedure: COLONOSCOPY;  Surgeon: Ladene Artist, MD;  Location: [facility] ENDOSCOPY;  Service: Endoscopy;  Laterality: N/A;  . CORONARY ANGIOPLASTY WITH STENT PLACEMENT  07/01/2011   DES-Resolute to native LCX  . CORONARY ANGIOPLASTY WITH STENT PLACEMENT  12/01/2007   COMPLEX 5 LESION PCI INCLUDING CUTTING BALLOON AND 4 CYPHER DESs  . CORONARY ARTERY BYPASS GRAFT  07/20/2010    LIMA-LAD; VG-ACUTE MARG of RCA; Seq VG-distal RCA & then pda  . EP IMPLANTABLE DEVICE N/A 02/14/2016   Procedure: Loop Recorder Removal;  Surgeon: Thompson Grayer, MD;  Location: [facility] CV LAB;  Service: Cardiovascular;  Laterality: N/A;  . ESOPHAGOGASTRODUODENOSCOPY  01/2014   gastric ulcer, erosive gastroduodenitis, H Pylori negative.   . ESOPHAGOGASTRODUODENOSCOPY (EGD) WITH PROPOFOL N/A 11/14/2014   Procedure: ESOPHAGOGASTRODUODENOSCOPY (EGD) WITH PROPOFOL;  Surgeon: Milus Banister, MD;  Location: [facility];  Service: Endoscopy;  Laterality: N/A;  . HAND SURGERY Right    to take glass out  . HERNIA  REPAIR    . INTRAMEDULLARY (IM) NAIL INTERTROCHANTERIC Right 02/07/2015   Procedure: INTRAMEDULLARY (IM) NAIL INTERTROCHANTRIC RIGHT HIP;  Surgeon: Renette Butters, MD;  Location: [facility];  Service: Orthopedics;  Laterality: Right;  . KNEE ARTHROSCOPY Right   . LEFT HEART CATHETERIZATION WITH CORONARY/GRAFT ANGIOGRAM  07/01/2011   Procedure: LEFT HEART CATHETERIZATION WITH Beatrix Fetters;  Surgeon: Sanda Klein, MD;  Location: [facility] CATH LAB;  Service: Cardiovascular;;  . LOOP RECORDER IMPLANT  02/15/2014   MDT LINQ implanted by Dr Rayann Heman for cryptogenic stroke  . PERCUTANEOUS CORONARY STENT INTERVENTION (PCI-S) Right 07/01/2011   Procedure: PERCUTANEOUS CORONARY STENT INTERVENTION (PCI-S);  Surgeon: Sanda Klein, MD;  Location: [facility]  CATH LAB;  Service: Cardiovascular;  Laterality: Right;  . PICC LINE PLACE PERIPHERAL ([place] HX)  06/2015  . RETINAL DETACHMENT SURGERY Right   . TEE WITHOUT CARDIOVERSION N/A 02/15/2014   Procedure: TRANSESOPHAGEAL ECHOCARDIOGRAM (TEE);  Surgeon: Josue Hector, MD;  Location: [facility] ENDOSCOPY;  Service: Cardiovascular;  Laterality: N/A;  . UMBILICAL HERNIA REPAIR  2006    There were no vitals filed for this visit.   Subjective Assessment - 09/13/19 1417    Subjective Patient reports he is taking a new supplement and feels less foggy.  Is going to go to [employer] tomorrow to try on shoes that are 4E wide.  Virtual consult with Bioness today.    Patient is accompained by: Family member   spouse   Pertinent History DNR; anxiety & depression, CAD with CABG, diverticulosis, gout, HLD, HTN, left hemiplegia, NSTEMI (2013), morbid obesity, PE (2016), CVA (2015), tubular adenoma of colon, OSA w/CPAP, right hip fx w/ORIF (2016), iron deficiency anemia    Patient Stated Goals to obtain a Bioness for home use; to return to PLOF of ambulating household distances and short community distances with quad cane    Currently in Pain? No/denies                             [facility] Adult PT Treatment/Exercise - 09/13/19 1418      Therapeutic Activites    Therapeutic Activities Other Therapeutic Activities    Other Therapeutic Activities Virtual consult with Bioness clinical specialist in [place].  New clinical specialist for this area is in training.  Bioness representative did not feel that use of Nustep would be beneficial for edema reduction; pt would have to pedal at 70 steps per minute in order for the Bioness to activate.  Representative discussed current Bioness use and goals of Bioness use with pt and therapist.  Specialist observed therapist set up Bioness on LLE and reviewed parameters with Specialist.  Able to elicit L ankle DF at higher intensity today with steering electrode.  Specialist  recommended saving settings and starting each therapy session with training mode at these settings x 15 minutes to continue to strengthen the neuromuscular communication and increase strength in L anterior tibialis.  Specialist does not recommend ambulating with Bioness yet.  Advised to start with 4 seconds on, 8 seconds off and gradually increase time, endurance and tolerance.  Specialist may be able to come to the clinic in person in August for further problem solving.         Modalities   Modalities Electrical engineer Stimulation Location L anterior tibialis and L quad muscles    Electrical Stimulation Action Pt able to tolerate higher intensity today; increased to 67 and  able to elicit DF with slight eversion but contraction did not sustain >5 seconds.      Electrical Stimulation Parameters See new saved parameters in Tablet 1; L steering electrode     Electrical Stimulation Goals Strength;Tone;Edema;Neuromuscular facilitation                  PT Education - 09/13/19 1427    Education Details plan for future visits    Person(s) Educated Patient;Spouse    Methods Explanation    Comprehension Verbalized understanding            PT Short Term Goals - 09/08/19 1719      PT SHORT TERM GOAL #1   Title Pt will be able to perform full HEP with wife's min A    Status On-going    Target Date 09/05/19      PT SHORT TERM GOAL #2   Title Pt will decrease TUG time with [facility] and AFO to </= 80 seconds with min A    Baseline 96 seconds with quad cane and Bioness; will reassess with AFO    Time 4    Period Weeks    Status Partially Met    Target Date 09/05/19      PT SHORT TERM GOAL #4   Title Pt will improve gait velocity (over 10 foot distance) to >/= .5 ft/sec with cane and new AFO    Baseline .17 ft/sec    Time 4    Period Weeks    Status Not Met    Target Date 09/05/19      PT SHORT TERM GOAL #5   Title Pt will consistently  perform stand pivot transfers to L and R with cane and AFO with min A    Time 4    Period Weeks    Status Achieved    Target Date 09/05/19             PT Long Term Goals - 08/17/19 2042      PT LONG TERM GOAL #1   Title Patient and wife demonstrate and verbalizes independence with final HEP    Baseline difficulty finding exercises for pt to do at home, sits and sleeps in recliner; no access to bed    Time 12    Period Weeks    Status Revised    Target Date 10/04/19      PT LONG TERM GOAL #2   Title Pt will improve TUG with cane and Bioness/AFO to </= 60 seconds    Baseline 108 seconds with cane and no AFO    Time 12    Period Weeks    Status Revised    Target Date 10/04/19      PT LONG TERM GOAL #3   Title Patient will improve gait velocity over full distance with quad cane and most appropriate orthosis (bioness or AFO) to >0.8 ft/sec    Baseline .35 over 10 foot distance with cane and no AFO    Time 12    Period Weeks    Status Revised    Target Date 10/04/19      PT LONG TERM GOAL #4   Title Patient demonstrates improved L passive ankle DF by at least 10 degrees.    Baseline lacking 24 deg with knee extended, lacking 10 deg with knee flexed > Lacking 20 deg with knee extended, lacking 16 deg with knee flexed    Time 12    Period Weeks    Status  Revised    Target Date 10/04/19      PT LONG TERM GOAL #5   Title Pt will demonstrate ability to transfer w/c <> mat/chair with quad cane, most appropriate orthosis and supervision    Baseline min-mod A with quad cane    Time 12    Period Weeks    Status Revised    Target Date 10/04/19                 Plan - 09/13/19 1428    Clinical Impression Statement Session focused on virtual consult with Bioness specialist; able to elicit ankle DF contraction with steering electrodes.  Plan for future visits is to start with 8 minutes of Bioness in training mode and progress up to 15 minutes at current intensity while  performing exercises.  Will then transition to gait training with AFO at end of session.  Wife continues to search for a shoe appropriate width for AFO.  After in person consult with Bioness specialist will determine if pt will benefit from home unit and if pt is ready to begin gait training with Bioness.  Pt and wife agree to plan.    Personal Factors and Comorbidities Age;Fitness;Past/Current Experience;Comorbidity 3+;Time since onset of injury/illness/exacerbation    Comorbidities anixety & depression, CAD, diverticulosis, gout, HLD, HTN, left hemiplegia, NSTEMI (2013), morbid obesity, PE (2016), CVA (2015), tubular adenoma of colon, OSA w/CPAP, right hip fx w/ORIF (2016), iron deficiency anemia    Examination-Activity Limitations Squat;Stairs;Stand;Dressing;Transfers;Bed Mobility;Locomotion Level    Examination-Participation Restrictions Church;Community Activity    Stability/Clinical Decision Making Evolving/Moderate complexity    Rehab Potential Good    PT Frequency 2x / week    PT Duration 12 weeks    PT Treatment/Interventions ADLs/Self Care Home Management;Electrical Stimulation;DME Instruction;Balance training;Therapeutic exercise;Orthotic Fit/Training;Gait training;Neuromuscular re-education;Stair training;Functional mobility training;Patient/family education;Therapeutic activities;Manual techniques;Compression bandaging;Passive range of motion    PT Next Visit Plan Recheck TUG with AFO - measure and look up thigh high compression stockings.  Bioness in training mode, 4 on, 8 off Tablet 1 with steering electrodes during Mat exercises in sitting/supine.  No walking with Bioness right now, only AFO.  Continue standing tolerance and endurance, weight shifting, gait training with new AFO.  Power w/c trial and safety assessment?  R sidelying for L hip ROM and strengthening    Consulted and Agree with Plan of Care Patient;Family member/caregiver    Family Member Consulted spouse            Patient will benefit from skilled therapeutic intervention in order to improve the following deficits and impairments:  Abnormal gait, Decreased range of motion, Difficulty walking, Impaired tone, Obesity, Impaired UE functional use, Decreased endurance, Decreased activity tolerance, Decreased skin integrity, Decreased balance, Decreased knowledge of use of DME, Impaired flexibility, Postural dysfunction, Impaired sensation, Increased edema, Decreased strength, Decreased mobility  Visit Diagnosis: Muscle weakness (generalized)  Other abnormalities of gait and mobility  Stiffness of left ankle, not elsewhere classified  Abnormal posture  Repeated falls  Foot drop, left  Unsteadiness on feet  Spastic hemiplegia of left nondominant side as late effect of cerebral infarction [facility])     Problem List Patient Active Problem List   Diagnosis Date Noted  . Left foot drop 07/14/2019  . GERD (gastroesophageal reflux disease) 06/18/2019  . Tinea cruris 07/20/2017  . Hemorrhoid 07/20/2017  . Irritability 06/08/2017  . Fall at home 05/06/2017  . Advance care planning 11/28/2016  . Skin mass 11/28/2016  . Lower urinary tract symptoms (LUTS)  09/05/2016  . Hearing loss 09/05/2016  . Excessive daytime sleepiness 04/16/2016  . Exposure to TB 02/21/2016  . Vitamin D deficiency 02/21/2016  . Anemia 11/10/2015  . Left thigh pain 11/10/2015  . Neck pain 10/12/2015  . Hemiplegia affecting left side in left-dominant patient as late effect of cerebrovascular disease ([place]) 09/03/2015  . Medicare annual wellness visit, subsequent 08/14/2015  . Acute pulmonary embolism ([place]) 07/25/2015  . Chest wall pain 07/15/2015  . History of pulmonary embolism 07/15/2015  . Chronic anticoagulation 05/22/2015  . Gait disturbance 05/15/2015  . DNR (do not resuscitate) 01/19/2015  . Hypotension 01/14/2015  . Paresthesia   . Depression 12/14/2014  . OSA (obstructive sleep apnea) 11/30/2014  . Chronic  combined systolic and diastolic congestive heart failure ([place]) 11/11/2014  . SOB (shortness of breath) 04/17/2014  . Hemiplegia affecting left dominant side ([place]) 02/16/2014  . 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 06/30/2011    [name], PT, DPT 09/13/19    2:33 PM    [facility] [address] [place], [place], [address] Phone: [phone]   Fax:  [phone]  Name: Gregory Wiley MRN: [id number] Date of Birth: 08/05/1950

## 2019-09-15 ENCOUNTER — Encounter: Payer: Self-pay | Admitting: Physical Therapy

## 2019-09-15 ENCOUNTER — Ambulatory Visit: Payer: Medicare Other | Admitting: Physical Therapy

## 2019-09-15 ENCOUNTER — Other Ambulatory Visit: Payer: Self-pay

## 2019-09-15 DIAGNOSIS — R293 Abnormal posture: Secondary | ICD-10-CM

## 2019-09-15 DIAGNOSIS — M6281 Muscle weakness (generalized): Secondary | ICD-10-CM | POA: Diagnosis not present

## 2019-09-15 DIAGNOSIS — R2681 Unsteadiness on feet: Secondary | ICD-10-CM

## 2019-09-15 DIAGNOSIS — M25672 Stiffness of left ankle, not elsewhere classified: Secondary | ICD-10-CM

## 2019-09-15 DIAGNOSIS — M21372 Foot drop, left foot: Secondary | ICD-10-CM

## 2019-09-15 DIAGNOSIS — R2689 Other abnormalities of gait and mobility: Secondary | ICD-10-CM

## 2019-09-15 DIAGNOSIS — R296 Repeated falls: Secondary | ICD-10-CM

## 2019-09-15 DIAGNOSIS — I69354 Hemiplegia and hemiparesis following cerebral infarction affecting left non-dominant side: Secondary | ICD-10-CM

## 2019-09-15 NOTE — Therapy (Signed)
[facility] [address] [place], [place], [address] Phone: [phone]   Fax:  [phone]  Physical Therapy Treatment  Patient Details  Name: Gregory Wiley MRN: [id number] Date of Birth: 1950/06/30 Referring Provider (PT): Felecia Shelling, Nanine Means, MD   Encounter Date: 09/15/2019   PT End of Session - 09/15/19 1908    Visit Number 28    Number of Visits 34    Date for PT Re-Evaluation 10/04/19    Authorization Type [employer] Medicare - 10th visit PN    Progress Note Due on Visit 29    PT Start Time 1320    PT Stop Time 1405    PT Time Calculation (min) 45 min    Equipment Utilized During Treatment Gait belt;Other (comment)   Bioness   Activity Tolerance Patient tolerated treatment well    Behavior During Therapy WFL for tasks assessed/performed           Past Medical History:  Diagnosis Date  . Adrenal insufficiency ([place])   . Anxiety   . Basal cell carcinoma of skin of left ankle   . Bleeding stomach ulcer 2015  . Coronary artery disease    stent, then CABG 07/20/10  . Daily headache    "for the past month" (11/11/2014)  . Depression   . Diverticulosis   . GERD (gastroesophageal reflux disease)   . Gout   . H/O cardiomyopathy    ischemic, now last echo 07/30/11, EF 38%W  . History of hiatal hernia   . Hyperlipidemia   . Hypertension   . Internal hemorrhoids   . Left hemiplegia ([place])   . NSTEMI (non-ST elevated myocardial infarction) ([place])    NSTEMI- last cath 06/2011-Stent to LCX-DES, last nuc 07/30/11 low risk  . Obesity (BMI 30-39.9)   . OSA on CPAP    since July 2013- uses CPAP sometimes , states he has lost 147 lbs. since stroke & doesn't use the CPAP as much as he use to.   . Plantar fasciitis of left foot   . Pneumonia    hosp. 12/2014  . Pulmonary embolism ([place]) 03/2014  . Stroke ([place]) 01/2014   "no control of LUE, can slightly LLE; memory problems since" (11/11/2014)  . Tubular adenoma of colon     Past  Surgical History:  Procedure Laterality Date  . BASAL CELL CARCINOMA EXCISION Left 2015   ankle  . CARDIAC SURGERY    . CATARACT EXTRACTION Bilateral   . COLONOSCOPY N/A 02/10/2013   Procedure: COLONOSCOPY;  Surgeon: Ladene Artist, MD;  Location: [facility] ENDOSCOPY;  Service: Endoscopy;  Laterality: N/A;  . CORONARY ANGIOPLASTY WITH STENT PLACEMENT  07/01/2011   DES-Resolute to native LCX  . CORONARY ANGIOPLASTY WITH STENT PLACEMENT  12/01/2007   COMPLEX 5 LESION PCI INCLUDING CUTTING BALLOON AND 4 CYPHER DESs  . CORONARY ARTERY BYPASS GRAFT  07/20/2010    LIMA-LAD; VG-ACUTE MARG of RCA; Seq VG-distal RCA & then pda  . EP IMPLANTABLE DEVICE N/A 02/14/2016   Procedure: Loop Recorder Removal;  Surgeon: Thompson Grayer, MD;  Location: [facility] CV LAB;  Service: Cardiovascular;  Laterality: N/A;  . ESOPHAGOGASTRODUODENOSCOPY  01/2014   gastric ulcer, erosive gastroduodenitis, H Pylori negative.   . ESOPHAGOGASTRODUODENOSCOPY (EGD) WITH PROPOFOL N/A 11/14/2014   Procedure: ESOPHAGOGASTRODUODENOSCOPY (EGD) WITH PROPOFOL;  Surgeon: Milus Banister, MD;  Location: [facility];  Service: Endoscopy;  Laterality: N/A;  . HAND SURGERY Right    to take glass out  . HERNIA  REPAIR    . INTRAMEDULLARY (IM) NAIL INTERTROCHANTERIC Right 02/07/2015   Procedure: INTRAMEDULLARY (IM) NAIL INTERTROCHANTRIC RIGHT HIP;  Surgeon: Renette Butters, MD;  Location: [facility];  Service: Orthopedics;  Laterality: Right;  . KNEE ARTHROSCOPY Right   . LEFT HEART CATHETERIZATION WITH CORONARY/GRAFT ANGIOGRAM  07/01/2011   Procedure: LEFT HEART CATHETERIZATION WITH Beatrix Fetters;  Surgeon: Sanda Klein, MD;  Location: [facility] C[facility] LAB;  Service: Cardiovascular;;  . LOOP RECORDER IMPLANT  02/15/2014   MDT LINQ implanted by Dr Rayann Heman for cryptogenic stroke  . PERCUTANEOUS CORONARY STENT INTERVENTION (PCI-S) Right 07/01/2011   Procedure: PERCUTANEOUS CORONARY STENT INTERVENTION (PCI-S);  Surgeon: Sanda Klein, MD;  Location: [facility]  CATH LAB;  Service: Cardiovascular;  Laterality: Right;  . PICC LINE PLACE PERIPHERAL ([place] HX)  06/2015  . RETINAL DETACHMENT SURGERY Right   . TEE WITHOUT CARDIOVERSION N/A 02/15/2014   Procedure: TRANSESOPHAGEAL ECHOCARDIOGRAM (TEE);  Surgeon: Josue Hector, MD;  Location: [facility] ENDOSCOPY;  Service: Cardiovascular;  Laterality: N/A;  . UMBILICAL HERNIA REPAIR  2006    There were no vitals filed for this visit.   Subjective Assessment - 09/15/19 1327    Subjective Has not had time to go to [employer] yet.  No other issues to report.    Patient is accompained by: Family member   spouse   Pertinent History DNR; anxiety & depression, CAD with CABG, diverticulosis, gout, HLD, HTN, left hemiplegia, NSTEMI (2013), morbid obesity, PE (2016), CVA (2015), tubular adenoma of colon, OSA w/CPAP, right hip fx w/ORIF (2016), iron deficiency anemia    Patient Stated Goals to obtain a Bioness for home use; to return to PLOF of ambulating household distances and short community distances with quad cane                             [facility] Adult PT Treatment/Exercise - 09/15/19 1328      Transfers   Transfers Sit to Stand;Stand to Sit    Sit to Stand 4: Min guard    Stand to Sit 4: Min guard      Ambulation/Gait   Ambulation/Gait Yes    Ambulation/Gait Assistance 4: Min assist    Ambulation/Gait Assistance Details after Bioness training mode, removed Bioness and donned L AFO and surgical shoe.  Therapist assisted with full advancement and placement of LLE during swing phase and anterior weight shift over LLE during stance phase.  Pt demonstrated improved step length and stance time on LLE today.    Ambulation Distance (Feet) 25 Feet   + 5   Assistive device Small based quad cane    Gait Pattern Step-through pattern;Decreased stride length;Decreased hip/knee flexion - left;Decreased dorsiflexion - left;Trunk rotated posteriorly on left    Ambulation Surface Level;Indoor      Therapeutic  Activites    Therapeutic Activities Other Therapeutic Activities    Other Therapeutic Activities Measured LLE for thigh high compression stocking; provided measurements to pt's wife to take to medical supply store to obtain correct size.      Neuro Re-ed    Neuro Re-ed Details  Bioness set in training mode x 8 minutes, 4 seconds on, 8 seconds off.  Pt cued to activate L ankle DF and L knee extension when stimulation was on.  Muscle contraction did not fatigue over 8 minutes.        Modalities   Modalities Teacher, English as a foreign language  Location L anterior tibialis and L quad muscles    Electrical Stimulation Action knee extension and ankle DF with patient's own active movement    Electrical Stimulation Parameters see saved parameters in Tablet 1, steering electrode    Electrical Stimulation Goals Strength;Tone;Edema;Neuromuscular facilitation;Other (comment)   ROM                   PT Short Term Goals - 09/08/19 1719      PT SHORT TERM GOAL #1   Title Pt will be able to perform full HEP with wife's min A    Status On-going    Target Date 09/05/19      PT SHORT TERM GOAL #2   Title Pt will decrease TUG time with [facility] and AFO to </= 80 seconds with min A    Baseline 96 seconds with quad cane and Bioness; will reassess with AFO    Time 4    Period Weeks    Status Partially Met    Target Date 09/05/19      PT SHORT TERM GOAL #4   Title Pt will improve gait velocity (over 10 foot distance) to >/= .5 ft/sec with cane and new AFO    Baseline .17 ft/sec    Time 4    Period Weeks    Status Not Met    Target Date 09/05/19      PT SHORT TERM GOAL #5   Title Pt will consistently perform stand pivot transfers to L and R with cane and AFO with min A    Time 4    Period Weeks    Status Achieved    Target Date 09/05/19             PT Long Term Goals - 08/17/19 2042      PT LONG TERM GOAL #1   Title Patient and wife  demonstrate and verbalizes independence with final HEP    Baseline difficulty finding exercises for pt to do at home, sits and sleeps in recliner; no access to bed    Time 12    Period Weeks    Status Revised    Target Date 10/04/19      PT LONG TERM GOAL #2   Title Pt will improve TUG with cane and Bioness/AFO to </= 60 seconds    Baseline 108 seconds with cane and no AFO    Time 12    Period Weeks    Status Revised    Target Date 10/04/19      PT LONG TERM GOAL #3   Title Patient will improve gait velocity over full distance with quad cane and most appropriate orthosis (bioness or AFO) to >0.8 ft/sec    Baseline .35 over 10 foot distance with cane and no AFO    Time 12    Period Weeks    Status Revised    Target Date 10/04/19      PT LONG TERM GOAL #4   Title Patient demonstrates improved L passive ankle DF by at least 10 degrees.    Baseline lacking 24 deg with knee extended, lacking 10 deg with knee flexed > Lacking 20 deg with knee extended, lacking 16 deg with knee flexed    Time 12    Period Weeks    Status Revised    Target Date 10/04/19      PT LONG TERM GOAL #5   Title Pt will demonstrate ability to transfer w/c <> mat/chair with quad cane,  most appropriate orthosis and supervision    Baseline min-mod A with quad cane    Time 12    Period Weeks    Status Revised    Target Date 10/04/19                 Plan - 09/15/19 1908    Clinical Impression Statement Measured pt for appropriate size compression stockings to continue to address LLE edema.  Continued to utilize Bioness in training mode on LLE for NMR and LE strengthening - able to elicit good contraction of L ankle DF and slight eversion x 8 minutes today.  Pt demonstrated improved gait sequence today and did not experience significant fatigue after Bioness use.  Will continue to utilize and will continue to progress towards LTG.    Personal Factors and Comorbidities Age;Fitness;Past/Current  Experience;Comorbidity 3+;Time since onset of injury/illness/exacerbation    Comorbidities anixety & depression, CAD, diverticulosis, gout, HLD, HTN, left hemiplegia, NSTEMI (2013), morbid obesity, PE (2016), CVA (2015), tubular adenoma of colon, OSA w/CPAP, right hip fx w/ORIF (2016), iron deficiency anemia    Examination-Activity Limitations Squat;Stairs;Stand;Dressing;Transfers;Bed Mobility;Locomotion Level    Examination-Participation Restrictions Church;Community Activity    Stability/Clinical Decision Making Evolving/Moderate complexity    Rehab Potential Good    PT Frequency 2x / week    PT Duration 12 weeks    PT Treatment/Interventions ADLs/Self Care Home Management;Electrical Stimulation;DME Instruction;Balance training;Therapeutic exercise;Orthotic Fit/Training;Gait training;Neuromuscular re-education;Stair training;Functional mobility training;Patient/family education;Therapeutic activities;Manual techniques;Compression bandaging;Passive range of motion    PT Next Visit Plan Recheck TUG with AFO - send to me for 10th visit PN.  Did they get new compression stocking for L?  Bioness in training mode, 4 on, 8 off Tablet 1 with steering electrodes during Mat exercises in sitting/supine.  No walking with Bioness right now, only AFO.  Continue standing tolerance and endurance, weight shifting, gait training with new AFO.  Power w/c trial and safety assessment?  R sidelying for L hip ROM and strengthening    Consulted and Agree with Plan of Care Patient;Family member/caregiver    Family Member Consulted spouse           Patient will benefit from skilled therapeutic intervention in order to improve the following deficits and impairments:  Abnormal gait, Decreased range of motion, Difficulty walking, Impaired tone, Obesity, Impaired UE functional use, Decreased endurance, Decreased activity tolerance, Decreased skin integrity, Decreased balance, Decreased knowledge of use of DME, Impaired  flexibility, Postural dysfunction, Impaired sensation, Increased edema, Decreased strength, Decreased mobility  Visit Diagnosis: Muscle weakness (generalized)  Other abnormalities of gait and mobility  Stiffness of left ankle, not elsewhere classified  Abnormal posture  Repeated falls  Foot drop, left  Unsteadiness on feet  Spastic hemiplegia of left nondominant side as late effect of cerebral infarction [facility]     Problem List Patient Active Problem List   Diagnosis Date Noted  . Left foot drop 07/14/2019  . GERD (gastroesophageal reflux disease) 06/18/2019  . Tinea cruris 07/20/2017  . Hemorrhoid 07/20/2017  . Irritability 06/08/2017  . Fall at home 05/06/2017  . Advance care planning 11/28/2016  . Skin mass 11/28/2016  . Lower urinary tract symptoms (LUTS) 09/05/2016  . Hearing loss 09/05/2016  . Excessive daytime sleepiness 04/16/2016  . Exposure to TB 02/21/2016  . Vitamin D deficiency 02/21/2016  . Anemia 11/10/2015  . Left thigh pain 11/10/2015  . Neck pain 10/12/2015  . Hemiplegia affecting left side in left-dominant patient as late effect of cerebrovascular disease ([place]) 09/03/2015  .  Medicare annual wellness visit, subsequent 08/14/2015  . Acute pulmonary embolism ([place]) 07/25/2015  . Chest wall pain 07/15/2015  . History of pulmonary embolism 07/15/2015  . Chronic anticoagulation 05/22/2015  . Gait disturbance 05/15/2015  . DNR (do not resuscitate) 01/19/2015  . Hypotension 01/14/2015  . Paresthesia   . Depression 12/14/2014  . OSA (obstructive sleep apnea) 11/30/2014  . Chronic combined systolic and diastolic congestive heart failure ([place]) 11/11/2014  . SOB (shortness of breath) 04/17/2014  . Hemiplegia affecting left dominant side ([place]) 02/16/2014  . 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 06/30/2011    [name], PT, DPT 09/15/19    7:16 PM    [facility] [address] [place], [place], [address] Phone: [phone]   Fax:  [phone]  Name: KYSHAWN TEAL MRN: [id number] Date of Birth: September 19, 1950

## 2019-09-21 ENCOUNTER — Other Ambulatory Visit: Payer: Self-pay

## 2019-09-21 ENCOUNTER — Ambulatory Visit (INDEPENDENT_AMBULATORY_CARE_PROVIDER_SITE_OTHER): Payer: Medicare Other | Admitting: Podiatry

## 2019-09-21 ENCOUNTER — Encounter: Payer: Self-pay | Admitting: Podiatry

## 2019-09-21 VITALS — Temp 97.1°F

## 2019-09-21 DIAGNOSIS — I69359 Hemiplegia and hemiparesis following cerebral infarction affecting unspecified side: Secondary | ICD-10-CM

## 2019-09-21 DIAGNOSIS — B351 Tinea unguium: Secondary | ICD-10-CM | POA: Diagnosis not present

## 2019-09-21 DIAGNOSIS — M79676 Pain in unspecified toe(s): Secondary | ICD-10-CM | POA: Diagnosis not present

## 2019-09-22 ENCOUNTER — Ambulatory Visit: Payer: Medicare Other | Attending: Neurology | Admitting: Physical Therapy

## 2019-09-22 ENCOUNTER — Encounter: Payer: Self-pay | Admitting: Physical Therapy

## 2019-09-22 DIAGNOSIS — R293 Abnormal posture: Secondary | ICD-10-CM

## 2019-09-22 DIAGNOSIS — R2681 Unsteadiness on feet: Secondary | ICD-10-CM

## 2019-09-22 DIAGNOSIS — M25672 Stiffness of left ankle, not elsewhere classified: Secondary | ICD-10-CM | POA: Insufficient documentation

## 2019-09-22 DIAGNOSIS — I69354 Hemiplegia and hemiparesis following cerebral infarction affecting left non-dominant side: Secondary | ICD-10-CM

## 2019-09-22 DIAGNOSIS — M21372 Foot drop, left foot: Secondary | ICD-10-CM | POA: Insufficient documentation

## 2019-09-22 DIAGNOSIS — M6281 Muscle weakness (generalized): Secondary | ICD-10-CM | POA: Insufficient documentation

## 2019-09-22 DIAGNOSIS — R296 Repeated falls: Secondary | ICD-10-CM | POA: Diagnosis present

## 2019-09-22 DIAGNOSIS — R2689 Other abnormalities of gait and mobility: Secondary | ICD-10-CM | POA: Diagnosis present

## 2019-09-23 NOTE — Therapy (Addendum)
[facility] [address] [place], [place], [address] Phone: [phone]   Fax:  [phone]  Physical Therapy Treatment and 10th visit Progress Note  Patient Details  Name: Gregory Wiley MRN: [id number] Date of Birth: Jul 28, 1950 Referring Provider (PT): Felecia Shelling, Nanine Means, MD   Encounter Date: 09/22/2019   Progress Note Reporting Period 08/13/19 to 09/22/2019  See note below for Objective Data and Assessment of Progress/Goals.   [name], PT, DPT 09/25/19    12:04 PM      PT End of Session - 09/22/19 1451    Visit Number 29    Number of Visits 34    Date for PT Re-Evaluation 10/04/19    Authorization Type [employer] Medicare - 10th visit PN    Progress Note Due on Visit 77    PT Start Time 1448    PT Stop Time 1527    PT Time Calculation (min) 39 min    Equipment Utilized During Treatment Gait belt;Other (comment)   Bioness   Activity Tolerance Patient tolerated treatment well;No increased pain    Behavior During Therapy WFL for tasks assessed/performed           Past Medical History:  Diagnosis Date  . Adrenal insufficiency ([place])   . Anxiety   . Basal cell carcinoma of skin of left ankle   . Bleeding stomach ulcer 2015  . Coronary artery disease    stent, then CABG 07/20/10  . Daily headache    "for the past month" (11/11/2014)  . Depression   . Diverticulosis   . GERD (gastroesophageal reflux disease)   . Gout   . H/O cardiomyopathy    ischemic, now last echo 07/30/11, EF 38%W  . History of hiatal hernia   . Hyperlipidemia   . Hypertension   . Internal hemorrhoids   . Left hemiplegia ([place])   . NSTEMI (non-ST elevated myocardial infarction) ([place])    NSTEMI- last cath 06/2011-Stent to LCX-DES, last nuc 07/30/11 low risk  . Obesity (BMI 30-39.9)   . OSA on CPAP    since July 2013- uses CPAP sometimes , states he has lost 147 lbs. since stroke & doesn't use the CPAP as much as he use to.   . Plantar  fasciitis of left foot   . Pneumonia    hosp. 12/2014  . Pulmonary embolism ([place]) 03/2014  . Stroke ([place]) 01/2014   "no control of LUE, can slightly LLE; memory problems since" (11/11/2014)  . Tubular adenoma of colon     Past Surgical History:  Procedure Laterality Date  . BASAL CELL CARCINOMA EXCISION Left 2015   ankle  . CARDIAC SURGERY    . CATARACT EXTRACTION Bilateral   . COLONOSCOPY N/A 02/10/2013   Procedure: COLONOSCOPY;  Surgeon: Ladene Artist, MD;  Location: [facility];  Service: Endoscopy;  Laterality: N/A;  . CORONARY ANGIOPLASTY WITH STENT PLACEMENT  07/01/2011   DES-Resolute to native LCX  . CORONARY ANGIOPLASTY WITH STENT PLACEMENT  12/01/2007   COMPLEX 5 LESION PCI INCLUDING CUTTING BALLOON AND 4 CYPHER DESs  . CORONARY ARTERY BYPASS GRAFT  07/20/2010    LIMA-LAD; VG-ACUTE MARG of RCA; Seq VG-distal RCA & then pda  . EP IMPLANTABLE DEVICE N/A 02/14/2016   Procedure: Loop Recorder Removal;  Surgeon: Thompson Grayer, MD;  Location: [facility];  Service: Cardiovascular;  Laterality: N/A;  . ESOPHAGOGASTRODUODENOSCOPY  01/2014   gastric ulcer, erosive gastroduodenitis, H Pylori negative.   . ESOPHAGOGASTRODUODENOSCOPY (  EGD) WITH PROPOFOL N/A 11/14/2014   Procedure: ESOPHAGOGASTRODUODENOSCOPY (EGD) WITH PROPOFOL;  Surgeon: Milus Banister, MD;  Location: [facility];  Service: Endoscopy;  Laterality: N/A;  . HAND SURGERY Right    to take glass out  . HERNIA REPAIR    . INTRAMEDULLARY (IM) NAIL INTERTROCHANTERIC Right 02/07/2015   Procedure: INTRAMEDULLARY (IM) NAIL INTERTROCHANTRIC RIGHT HIP;  Surgeon: Renette Butters, MD;  Location: [facility];  Service: Orthopedics;  Laterality: Right;  . KNEE ARTHROSCOPY Right   . LEFT HEART CATHETERIZATION WITH CORONARY/GRAFT ANGIOGRAM  07/01/2011   Procedure: LEFT HEART CATHETERIZATION WITH Beatrix Fetters;  Surgeon: Sanda Klein, MD;  Location: [facility] LAB;  Service: Cardiovascular;;  . LOOP RECORDER IMPLANT  02/15/2014     MDT LINQ implanted by Dr Rayann Heman for cryptogenic stroke  . PERCUTANEOUS CORONARY STENT INTERVENTION (PCI-S) Right 07/01/2011   Procedure: PERCUTANEOUS CORONARY STENT INTERVENTION (PCI-S);  Surgeon: Sanda Klein, MD;  Location: [facility] CATH LAB;  Service: Cardiovascular;  Laterality: Right;  . PICC LINE PLACE PERIPHERAL ([place] HX)  06/2015  . RETINAL DETACHMENT SURGERY Right   . TEE WITHOUT CARDIOVERSION N/A 02/15/2014   Procedure: TRANSESOPHAGEAL ECHOCARDIOGRAM (TEE);  Surgeon: Josue Hector, MD;  Location: [facility] ENDOSCOPY;  Service: Cardiovascular;  Laterality: N/A;  . UMBILICAL HERNIA REPAIR  2006    There were no vitals filed for this visit.   Subjective Assessment - 09/22/19 1450    Subjective No new complaints. No falls. Has compression garment on.  Does report feeling fuzzy headed today.    Patient is accompained by: Family member   spouse   Pertinent History DNR; anxiety & depression, CAD with CABG, diverticulosis, gout, HLD, HTN, left hemiplegia, NSTEMI (2013), morbid obesity, PE (2016), CVA (2015), tubular adenoma of colon, OSA w/CPAP, right hip fx w/ORIF (2016), iron deficiency anemia    Limitations Standing;Walking    Patient Stated Goals to obtain a Bioness for home use; to return to PLOF of ambulating household distances and short community distances with quad cane    Currently in Pain? No/denies    Pain Score 0-No pain              [facility] PT Assessment - 09/22/19 1452      Timed Up and Go Test   TUG Normal TUG    Normal TUG (seconds) 93.35    TUG Comments with quad cane and AFO                [facility] Adult PT Treatment/Exercise - 09/22/19 1452      Transfers   Transfers Sit to Stand;Stand to Sit    Sit to Stand 4: Min guard;With upper extremity assist;From bed;From chair/3-in-1    Stand to Sit 4: Min guard;With upper extremity assist;To bed;To chair/3-in-1    Stand Pivot Transfers 4: Min guard    Stand Pivot Transfer Details (indicate cue type and reason) with large  base quad cane from/to transport chair<>mat table      Ambulation/Gait   Ambulation/Gait Yes    Ambulation/Gait Assistance 4: Min assist;4: Min guard    Ambulation/Gait Assistance Details with AFO on left LE for testing with timed up and go. cues needed for posture, step length and step placement    Assistive device Small based quad cane    Gait Pattern Step-through pattern;Decreased stride length;Decreased hip/knee flexion - left;Decreased dorsiflexion - left;Trunk rotated posteriorly on left    Ambulation Surface Level;Indoor      Neuro Re-ed    Neuro Re-ed Details  concurrent with Bioness to left LE set in training mode with on time of 5 sec's, rest time of 10 sec's: seated at edge of mat for long arc quads with on time, rest with off time for 10 reps, assist needed for full knee extension with pt working on active DF with on time as well; with left foot on balance board in ant/post direction- AA DF with on time, rest with off time for 12 reps. PTA assist with increased range via facilitation of board; in standing with UE support on cane- mini squats with on time, rest with off times for 4 reps before pt needed to stop due to fatigue and increased low back discomfort.       Acupuncturist Location L anterior tibialis and L quad muscles    Electrical Stimulation Action for increased muscle activation and strengthening    Electrical Stimulation Parameters refer to tablet 1 for adjusted parameters; steering electrodes    Electrical Stimulation Goals Strength;Tone;Edema;Neuromuscular facilitation;Other (comment)   ROM              PT Short Term Goals - 09/08/19 1719      PT SHORT TERM GOAL #1   Title Pt will be able to perform full HEP with wife's min A    Status On-going    Target Date 09/05/19      PT SHORT TERM GOAL #2   Title Pt will decrease TUG time with [facility] and AFO to </= 80 seconds with min A    Baseline 96 seconds with quad cane and  Bioness; will reassess with AFO    Time 4    Period Weeks    Status Partially Met    Target Date 09/05/19      PT SHORT TERM GOAL #4   Title Pt will improve gait velocity (over 10 foot distance) to >/= .5 ft/sec with cane and new AFO    Baseline .17 ft/sec    Time 4    Period Weeks    Status Not Met    Target Date 09/05/19      PT SHORT TERM GOAL #5   Title Pt will consistently perform stand pivot transfers to L and R with cane and AFO with min A    Time 4    Period Weeks    Status Achieved    Target Date 09/05/19             PT Long Term Goals - 08/17/19 2042      PT LONG TERM GOAL #1   Title Patient and wife demonstrate and verbalizes independence with final HEP    Baseline difficulty finding exercises for pt to do at home, sits and sleeps in recliner; no access to bed    Time 12    Period Weeks    Status Revised    Target Date 10/04/19      PT LONG TERM GOAL #2   Title Pt will improve TUG with cane and Bioness/AFO to </= 60 seconds    Baseline 108 seconds with cane and no AFO    Time 12    Period Weeks    Status Revised    Target Date 10/04/19      PT LONG TERM GOAL #3   Title Patient will improve gait velocity over full distance with quad cane and most appropriate orthosis (bioness or AFO) to >0.8 ft/sec    Baseline .35 over 10 foot distance with cane and  no AFO    Time 12    Period Weeks    Status Revised    Target Date 10/04/19      PT LONG TERM GOAL #4   Title Patient demonstrates improved L passive ankle DF by at least 10 degrees.    Baseline lacking 24 deg with knee extended, lacking 10 deg with knee flexed > Lacking 20 deg with knee extended, lacking 16 deg with knee flexed    Time 12    Period Weeks    Status Revised    Target Date 10/04/19      PT LONG TERM GOAL #5   Title Pt will demonstrate ability to transfer w/c <> mat/chair with quad cane, most appropriate orthosis and supervision    Baseline min-mod A with quad cane    Time 12     Period Weeks    Status Revised    Target Date 10/04/19                 Plan - 09/22/19 1452    Clinical Impression Statement Today's skilled session initially focused on obtaining Timed up and go with pt wearing left AFO with large base quad cane. Time obtained toay was 2 seconds less than when tested with Bioness/cane at a previous session. Remainder of session continued with use of Bioness to left LE concurrently with ex's for strenghtening and ROM. No issues reported. Rest breaks taken as needed due to fatigue/low back discomfort in standing. The pt should benefit from continued PT to progress toward unmet goals.    Personal Factors and Comorbidities Age;Fitness;Past/Current Experience;Comorbidity 3+;Time since onset of injury/illness/exacerbation    Comorbidities anixety & depression, CAD, diverticulosis, gout, HLD, HTN, left hemiplegia, NSTEMI (2013), morbid obesity, PE (2016), CVA (2015), tubular adenoma of colon, OSA w/CPAP, right hip fx w/ORIF (2016), iron deficiency anemia    Examination-Activity Limitations Squat;Stairs;Stand;Dressing;Transfers;Bed Mobility;Locomotion Level    Examination-Participation Restrictions Church;Community Activity    Stability/Clinical Decision Making Evolving/Moderate complexity    Rehab Potential Good    PT Frequency 2x / week    PT Duration 12 weeks    PT Treatment/Interventions ADLs/Self Care Home Management;Electrical Stimulation;DME Instruction;Balance training;Therapeutic exercise;Orthotic Fit/Training;Gait training;Neuromuscular re-education;Stair training;Functional mobility training;Patient/family education;Therapeutic activities;Manual techniques;Compression bandaging;Passive range of motion    PT Next Visit Plan Bioness in training mode, during Mat exercises in sitting/supine.  No walking with Bioness right now, only AFO.  Continue standing tolerance and endurance, weight shifting, gait training with new AFO.  Power w/c trial and safety  assessment?  R sidelying for L hip ROM and strengthening    Consulted and Agree with Plan of Care Patient;Family member/caregiver    Family Member Consulted spouse           Patient will benefit from skilled therapeutic intervention in order to improve the following deficits and impairments:  Abnormal gait, Decreased range of motion, Difficulty walking, Impaired tone, Obesity, Impaired UE functional use, Decreased endurance, Decreased activity tolerance, Decreased skin integrity, Decreased balance, Decreased knowledge of use of DME, Impaired flexibility, Postural dysfunction, Impaired sensation, Increased edema, Decreased strength, Decreased mobility  Visit Diagnosis: Muscle weakness (generalized)  Other abnormalities of gait and mobility  Stiffness of left ankle, not elsewhere classified  Abnormal posture  Repeated falls  Foot drop, left  Unsteadiness on feet  Spastic hemiplegia of left nondominant side as late effect of cerebral infarction [facility])     Problem List Patient Active Problem List   Diagnosis Date Noted  . Left foot drop  07/14/2019  . GERD (gastroesophageal reflux disease) 06/18/2019  . Tinea cruris 07/20/2017  . Hemorrhoid 07/20/2017  . Irritability 06/08/2017  . Fall at home 05/06/2017  . Advance care planning 11/28/2016  . Skin mass 11/28/2016  . Lower urinary tract symptoms (LUTS) 09/05/2016  . Hearing loss 09/05/2016  . Excessive daytime sleepiness 04/16/2016  . Exposure to TB 02/21/2016  . Vitamin D deficiency 02/21/2016  . Anemia 11/10/2015  . Left thigh pain 11/10/2015  . Neck pain 10/12/2015  . Hemiplegia affecting left side in left-dominant patient as late effect of cerebrovascular disease ([place]) 09/03/2015  . Medicare annual wellness visit, subsequent 08/14/2015  . Acute pulmonary embolism ([place]) 07/25/2015  . Chest wall pain 07/15/2015  . History of pulmonary embolism 07/15/2015  . Chronic anticoagulation 05/22/2015  . Gait disturbance  05/15/2015  . DNR (do not resuscitate) 01/19/2015  . Hypotension 01/14/2015  . Paresthesia   . Depression 12/14/2014  . OSA (obstructive sleep apnea) 11/30/2014  . Chronic combined systolic and diastolic congestive heart failure ([place]) 11/11/2014  . SOB (shortness of breath) 04/17/2014  . Hemiplegia affecting left dominant side ([place]) 02/16/2014  . 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 06/30/2011    Willow Ora, PTA, [facility] [address] [place], [place] [address] [phone] 09/23/19, 11:43 AM   Name: Gregory Wiley MRN: [id number] Date of Birth: [birth year]

## 2019-09-24 ENCOUNTER — Ambulatory Visit: Payer: Medicare Other | Admitting: Physical Therapy

## 2019-09-24 ENCOUNTER — Other Ambulatory Visit: Payer: Self-pay

## 2019-09-24 DIAGNOSIS — R2689 Other abnormalities of gait and mobility: Secondary | ICD-10-CM

## 2019-09-24 DIAGNOSIS — R293 Abnormal posture: Secondary | ICD-10-CM

## 2019-09-24 DIAGNOSIS — M21372 Foot drop, left foot: Secondary | ICD-10-CM

## 2019-09-24 DIAGNOSIS — I69354 Hemiplegia and hemiparesis following cerebral infarction affecting left non-dominant side: Secondary | ICD-10-CM

## 2019-09-24 DIAGNOSIS — R2681 Unsteadiness on feet: Secondary | ICD-10-CM

## 2019-09-24 DIAGNOSIS — R296 Repeated falls: Secondary | ICD-10-CM

## 2019-09-24 DIAGNOSIS — M6281 Muscle weakness (generalized): Secondary | ICD-10-CM

## 2019-09-24 DIAGNOSIS — M25672 Stiffness of left ankle, not elsewhere classified: Secondary | ICD-10-CM

## 2019-09-24 NOTE — Therapy (Addendum)
[place] [address] [place], [place], [address] Phone: [phone]   Fax:  [phone]  Physical Therapy Treatment  Patient Details  Name: Gregory Wiley MRN: [id number] Date of Birth: Mar 04, 1951 Referring Provider (PT): Felecia Shelling, Nanine Means, MD   Encounter Date: 09/24/2019   PT End of Session - 09/24/19 2204    Visit Number 30    Number of Visits 34    Date for PT Re-Evaluation 10/04/19    Authorization Type [employer] Medicare - 10th visit PN    Progress Note Due on Visit 7    PT Start Time 1545    PT Stop Time 1630    PT Time Calculation (min) 45 min    Equipment Utilized During Treatment Gait belt;Other (comment)   Bioness   Activity Tolerance Patient tolerated treatment well;No increased pain    Behavior During Therapy WFL for tasks assessed/performed           Past Medical History:  Diagnosis Date  . Adrenal insufficiency ([place])   . Anxiety   . Basal cell carcinoma of skin of left ankle   . Bleeding stomach ulcer 2015  . Coronary artery disease    stent, then CABG 07/20/10  . Daily headache    "for the past month" (11/11/2014)  . Depression   . Diverticulosis   . GERD (gastroesophageal reflux disease)   . Gout   . H/O cardiomyopathy    ischemic, now last echo 07/30/11, EF 38%W  . History of hiatal hernia   . Hyperlipidemia   . Hypertension   . Internal hemorrhoids   . Left hemiplegia ([place])   . NSTEMI (non-ST elevated myocardial infarction) ([place])    NSTEMI- last cath 06/2011-Stent to LCX-DES, last nuc 07/30/11 low risk  . Obesity (BMI 30-39.9)   . OSA on CPAP    since July 2013- uses CPAP sometimes , states he has lost 147 lbs. since stroke & doesn't use the CPAP as much as he use to.   . Plantar fasciitis of left foot   . Pneumonia    hosp. 12/2014  . Pulmonary embolism ([place]) 03/2014  . Stroke ([place]) 01/2014   "no control of LUE, can slightly LLE; memory problems since" (11/11/2014)  . Tubular adenoma of  colon     Past Surgical History:  Procedure Laterality Date  . BASAL CELL CARCINOMA EXCISION Left 2015   ankle  . CARDIAC SURGERY    . CATARACT EXTRACTION Bilateral   . COLONOSCOPY N/A 02/10/2013   Procedure: COLONOSCOPY;  Surgeon: Ladene Artist, MD;  Location: [facility];  Service: Endoscopy;  Laterality: N/A;  . CORONARY ANGIOPLASTY WITH STENT PLACEMENT  07/01/2011   DES-Resolute to native LCX  . CORONARY ANGIOPLASTY WITH STENT PLACEMENT  12/01/2007   COMPLEX 5 LESION PCI INCLUDING CUTTING BALLOON AND 4 CYPHER DESs  . CORONARY ARTERY BYPASS GRAFT  07/20/2010    LIMA-LAD; VG-ACUTE MARG of RCA; Seq VG-distal RCA & then pda  . EP IMPLANTABLE DEVICE N/A 02/14/2016   Procedure: Loop Recorder Removal;  Surgeon: Thompson Grayer, MD;  Location: [place] [facility];  Service: Cardiovascular;  Laterality: N/A;  . ESOPHAGOGASTRODUODENOSCOPY  01/2014   gastric ulcer, erosive gastroduodenitis, H Pylori negative.   . ESOPHAGOGASTRODUODENOSCOPY (EGD) WITH PROPOFOL N/A 11/14/2014   Procedure: ESOPHAGOGASTRODUODENOSCOPY (EGD) WITH PROPOFOL;  Surgeon: Milus Banister, MD;  Location: [facility];  Service: Endoscopy;  Laterality: N/A;  . HAND SURGERY Right    to take glass out  .  HERNIA REPAIR    . INTRAMEDULLARY (IM) NAIL INTERTROCHANTERIC Right 02/07/2015   Procedure: INTRAMEDULLARY (IM) NAIL INTERTROCHANTRIC RIGHT HIP;  Surgeon: Renette Butters, MD;  Location: [facility];  Service: Orthopedics;  Laterality: Right;  . KNEE ARTHROSCOPY Right   . LEFT HEART CATHETERIZATION WITH CORONARY/GRAFT ANGIOGRAM  07/01/2011   Procedure: LEFT HEART CATHETERIZATION WITH Beatrix Fetters;  Surgeon: Sanda Klein, MD;  Location: [facility] CATH LAB;  Service: Cardiovascular;;  . LOOP RECORDER IMPLANT  02/15/2014   MDT LINQ implanted by Dr Rayann Heman for cryptogenic stroke  . PERCUTANEOUS CORONARY STENT INTERVENTION (PCI-S) Right 07/01/2011   Procedure: PERCUTANEOUS CORONARY STENT INTERVENTION (PCI-S);  Surgeon: Sanda Klein,  MD;  Location: [facility];  Service: Cardiovascular;  Laterality: Right;  . PICC LINE PLACE PERIPHERAL ([place] HX)  06/2015  . RETINAL DETACHMENT SURGERY Right   . TEE WITHOUT CARDIOVERSION N/A 02/15/2014   Procedure: TRANSESOPHAGEAL ECHOCARDIOGRAM (TEE);  Surgeon: Josue Hector, MD;  Location: [facility] ENDOSCOPY;  Service: Cardiovascular;  Laterality: N/A;  . UMBILICAL HERNIA REPAIR  2006    There were no vitals filed for this visit.   Subjective Assessment - 09/24/19 2201    Subjective Wide shoes at [employer] did not allow AFO and foot to fit inside; will continue to search for 6E shoes for AFO.  Has not purchased new compression stockings for edema management.  Asking about NMES unit for patient's LLE to use at home to help with activation and edema.    Patient is accompained by: Family member   spouse   Pertinent History DNR; anxiety & depression, CAD with CABG, diverticulosis, gout, HLD, HTN, left hemiplegia, NSTEMI (2013), morbid obesity, PE (2016), CVA (2015), tubular adenoma of colon, OSA w/CPAP, right hip fx w/ORIF (2016), iron deficiency anemia    Limitations Standing;Walking    Patient Stated Goals to obtain a Bioness for home use; to return to PLOF of ambulating household distances and short community distances with quad cane    Currently in Pain? No/denies                             [facility] Adult PT Treatment/Exercise - 09/25/19 0921      Transfers   Transfers Sit to Stand;Stand to Sit;Stand Pivot Transfers    Sit to Stand 4: Min guard    Sit to Stand Details (indicate cue type and reason) slight assistance to shift weight forwards    Stand to Sit 4: Min guard    Stand Pivot Transfers 5: Supervision    Stand Pivot Transfer Details (indicate cue type and reason) with quad cane      Ambulation/Gait   Ambulation/Gait Yes    Ambulation/Gait Assistance 4: Min assist    Ambulation/Gait Assistance Details with AFO and surgical shoe; therapist provided support on L side  to maintain upright trunk and for weight shifting.  Pt only experienced one episode of L foot drag but no episodes of LLE scissoring.  Demonstrated improved LLE advancement and step length    Ambulation Distance (Feet) 30 Feet   15 x 2   Assistive device Small based quad cane    Gait Pattern Step-through pattern;Decreased stride length;Decreased hip/knee flexion - left;Decreased dorsiflexion - left;Trunk rotated posteriorly on left;Poor foot clearance - left    Ambulation Surface Level;Indoor      Therapeutic Activites    Therapeutic Activities Other Therapeutic Activities    Other Therapeutic Activities Assisted pt and wife with researching other  options for shoes to fit AFO in (6E).  [employer] allows single shoe ordering but size needed is out of stock.  Found another website for AFO specific shoes but website states will take 6-12 weeks and price too high for pt.  Will continue to research options.      Knee/Hip Exercises: Seated   Heel Slides Strengthening;Left;1 set;10 reps      Modalities   Modalities Electrical Stimulation      Electrical Stimulation   Electrical Stimulation Location L anterior tibialis and L quad muscles    Electrical Stimulation Action Strengthening ankle DF and knee extension.  Placed L foot on rockerboard and cued pt to tilt board back when Bioness on; tilt forwards when Bioness off in sitting x 9 minutes with Bioness on training mode; 4 seconds on, 8 seconds off    Electrical Stimulation Parameters refer to tablet 1 for adjusted parameters; steering electrodes    Electrical Stimulation Goals Strength;Tone;Edema;Neuromuscular facilitation;Other (comment)   ROM                 PT Education - 09/24/19 2203    Education Details see TA: education regarding use of NMES, appropriate shoes for AFO and possible places to purchse online if not found in stores.    Person(s) Educated Patient;Spouse    Methods Explanation;Demonstration    Comprehension Verbalized  understanding            PT Short Term Goals - 09/08/19 1719      PT SHORT TERM GOAL #1   Title Pt will be able to perform full HEP with wife's min A    Status On-going    Target Date 09/05/19      PT SHORT TERM GOAL #2   Title Pt will decrease TUG time with [facility] and AFO to </= 80 seconds with min A    Baseline 96 seconds with quad cane and Bioness; will reassess with AFO    Time 4    Period Weeks    Status Partially Met    Target Date 09/05/19      PT SHORT TERM GOAL #4   Title Pt will improve gait velocity (over 10 foot distance) to >/= .5 ft/sec with cane and new AFO    Baseline .17 ft/sec    Time 4    Period Weeks    Status Not Met    Target Date 09/05/19      PT SHORT TERM GOAL #5   Title Pt will consistently perform stand pivot transfers to L and R with cane and AFO with min A    Time 4    Period Weeks    Status Achieved    Target Date 09/05/19             PT Long Term Goals - 08/17/19 2042      PT LONG TERM GOAL #1   Title Patient and wife demonstrate and verbalizes independence with final HEP    Baseline difficulty finding exercises for pt to do at home, sits and sleeps in recliner; no access to bed    Time 12    Period Weeks    Status Revised    Target Date 10/04/19      PT LONG TERM GOAL #2   Title Pt will improve TUG with cane and Bioness/AFO to </= 60 seconds    Baseline 108 seconds with cane and no AFO    Time 12    Period Weeks  Status Revised    Target Date 10/04/19      PT LONG TERM GOAL #3   Title Patient will improve gait velocity over full distance with quad cane and most appropriate orthosis (bioness or AFO) to >0.8 ft/sec    Baseline .35 over 10 foot distance with cane and no AFO    Time 12    Period Weeks    Status Revised    Target Date 10/04/19      PT LONG TERM GOAL #4   Title Patient demonstrates improved L passive ankle DF by at least 10 degrees.    Baseline lacking 24 deg with knee extended, lacking 10 deg with knee  flexed > Lacking 20 deg with knee extended, lacking 16 deg with knee flexed    Time 12    Period Weeks    Status Revised    Target Date 10/04/19      PT LONG TERM GOAL #5   Title Pt will demonstrate ability to transfer w/c <> mat/chair with quad cane, most appropriate orthosis and supervision    Baseline min-mod A with quad cane    Time 12    Period Weeks    Status Revised    Target Date 10/04/19                 Plan - 09/25/19 0930    Clinical Impression Statement Continued to utilize Bioness in training mode to facilitate increased L ankle DF ROM and strength, knee extension strength, and LLE edema.  While performing active ankle DF and PF on rockerboard therapist continued to research shoe options for pt to use with AFO.  Pt demonstrated improved gait technique today with decreased assistance from therapist but continues to be limited by poor endurance.  Will continue to address in order to progress towards LTG.    Personal Factors and Comorbidities Age;Fitness;Past/Current Experience;Comorbidity 3+;Time since onset of injury/illness/exacerbation    Comorbidities anixety & depression, CAD, diverticulosis, gout, HLD, HTN, left hemiplegia, NSTEMI (2013), morbid obesity, PE (2016), CVA (2015), tubular adenoma of colon, OSA w/CPAP, right hip fx w/ORIF (2016), iron deficiency anemia    Examination-Activity Limitations Squat;Stairs;Stand;Dressing;Transfers;Bed Mobility;Locomotion Level    Examination-Participation Restrictions Church;Community Activity    Stability/Clinical Decision Making Evolving/Moderate complexity    Rehab Potential Good    PT Frequency 2x / week    PT Duration 12 weeks    PT Treatment/Interventions ADLs/Self Care Home Management;Electrical Stimulation;DME Instruction;Balance training;Therapeutic exercise;Orthotic Fit/Training;Gait training;Neuromuscular re-education;Stair training;Functional mobility training;Patient/family education;Therapeutic activities;Manual  techniques;Compression bandaging;Passive range of motion    PT Next Visit Plan Bioness in training mode, during Mat exercises in sitting/supine.  No walking with Bioness right now, only AFO.  Continue standing tolerance and endurance, weight shifting, gait training with new AFO.  Power w/c trial and safety assessment?  R sidelying for L hip ROM and strengthening    Consulted and Agree with Plan of Care Patient;Family member/caregiver    Family Member Consulted spouse           Patient will benefit from skilled therapeutic intervention in order to improve the following deficits and impairments:  Abnormal gait, Decreased range of motion, Difficulty walking, Impaired tone, Obesity, Impaired UE functional use, Decreased endurance, Decreased activity tolerance, Decreased skin integrity, Decreased balance, Decreased knowledge of use of DME, Impaired flexibility, Postural dysfunction, Impaired sensation, Increased edema, Decreased strength, Decreased mobility  Visit Diagnosis: Muscle weakness (generalized)  Other abnormalities of gait and mobility  Stiffness of left ankle, not elsewhere classified  Abnormal posture  Repeated falls  Foot drop, left  Unsteadiness on feet  Spastic hemiplegia of left nondominant side as late effect of cerebral infarction [facility])     Problem List Patient Active Problem List   Diagnosis Date Noted  . Left foot drop 07/14/2019  . GERD (gastroesophageal reflux disease) 06/18/2019  . Tinea cruris 07/20/2017  . Hemorrhoid 07/20/2017  . Irritability 06/08/2017  . Fall at home 05/06/2017  . Advance care planning 11/28/2016  . Skin mass 11/28/2016  . Lower urinary tract symptoms (LUTS) 09/05/2016  . Hearing loss 09/05/2016  . Excessive daytime sleepiness 04/16/2016  . Exposure to TB 02/21/2016  . Vitamin D deficiency 02/21/2016  . Anemia 11/10/2015  . Left thigh pain 11/10/2015  . Neck pain 10/12/2015  . Hemiplegia affecting left side in left-dominant  patient as late effect of cerebrovascular disease ([place]) 09/03/2015  . Medicare annual wellness visit, subsequent 08/14/2015  . Acute pulmonary embolism ([place]) 07/25/2015  . Chest wall pain 07/15/2015  . History of pulmonary embolism 07/15/2015  . Chronic anticoagulation 05/22/2015  . Gait disturbance 05/15/2015  . DNR (do not resuscitate) 01/19/2015  . Hypotension 01/14/2015  . Paresthesia   . Depression 12/14/2014  . OSA (obstructive sleep apnea) 11/30/2014  . Chronic combined systolic and diastolic congestive heart failure ([place]) 11/11/2014  . SOB (shortness of breath) 04/17/2014  . Hemiplegia affecting left dominant side ([place]) 02/16/2014  . 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 06/30/2011   [name], PT, DPT 09/25/19    9:34 AM    [place] [address] [place], [place], [address] Phone: [phone]   Fax:  [phone]  Name: OBI SCRIMA MRN: [id number] Date of Birth: 1950-08-07

## 2019-09-24 NOTE — Progress Notes (Signed)
Subjective:  Patient ID: Gregory Wiley, male    DOB: March 13, 1951,  MRN: [id number]  Mont Dutton Ansley presents to clinic today for painful thick toenails that are difficult to trim. Aggravating factors include wearing enclosed shoe gear. Pain is relieved with periodic professional debridement.   His wife is present during today's visit.  69 y.o. male presents with the above complaint.  Reports painfully elongated nails to both feet.  Review of Systems: Negative except as noted in the HPI. Past Medical History:  Diagnosis Date  . Adrenal insufficiency ([place])   . Anxiety   . Basal cell carcinoma of skin of left ankle   . Bleeding stomach ulcer 2015  . Coronary artery disease    stent, then CABG 07/20/10  . Daily headache    "for the past month" (11/11/2014)  . Depression   . Diverticulosis   . GERD (gastroesophageal reflux disease)   . Gout   . H/O cardiomyopathy    ischemic, now last echo 07/30/11, EF 38%W  . History of hiatal hernia   . Hyperlipidemia   . Hypertension   . Internal hemorrhoids   . Left hemiplegia ([place])   . NSTEMI (non-ST elevated myocardial infarction) ([place])    NSTEMI- last cath 06/2011-Stent to LCX-DES, last nuc 07/30/11 low risk  . Obesity (BMI 30-39.9)   . OSA on CPAP    since July 2013- uses CPAP sometimes , states he has lost 147 lbs. since stroke & doesn't use the CPAP as much as he use to.   . Plantar fasciitis of left foot   . Pneumonia    hosp. 12/2014  . Pulmonary embolism ([place]) 03/2014  . Stroke ([place]) 01/2014   "no control of LUE, can slightly LLE; memory problems since" (11/11/2014)  . Tubular adenoma of colon    Past Surgical History:  Procedure Laterality Date  . BASAL CELL CARCINOMA EXCISION Left 2015   ankle  . CARDIAC SURGERY    . CATARACT EXTRACTION Bilateral   . COLONOSCOPY N/A 02/10/2013   Procedure: COLONOSCOPY;  Surgeon: Ladene Artist, MD;  [facility];  Service: Endoscopy;  Laterality: N/A;  . CORONARY ANGIOPLASTY WITH STENT  PLACEMENT  07/01/2011   DES-Resolute to native LCX  . CORONARY ANGIOPLASTY WITH STENT PLACEMENT  12/01/2007   COMPLEX 5 LESION PCI INCLUDING CUTTING BALLOON AND 4 CYPHER DESs  . CORONARY ARTERY BYPASS GRAFT  07/20/2010    LIMA-LAD; VG-ACUTE MARG of RCA; Seq VG-distal RCA & then pda  . EP IMPLANTABLE DEVICE N/A 02/14/2016   Procedure: Loop Recorder Removal;  Surgeon: Thompson Grayer, MD;  Location: [facility];  Service: Cardiovascular;  Laterality: N/A;  . ESOPHAGOGASTRODUODENOSCOPY  01/2014   gastric ulcer, erosive gastroduodenitis, H Pylori negative.   . ESOPHAGOGASTRODUODENOSCOPY (EGD) WITH PROPOFOL N/A 11/14/2014   Procedure: ESOPHAGOGASTRODUODENOSCOPY (EGD) WITH PROPOFOL;  Surgeon: Milus Banister, MD;  Location: [facility];  Service: Endoscopy;  Laterality: N/A;  . HAND SURGERY Right    to take glass out  . HERNIA REPAIR    . INTRAMEDULLARY (IM) NAIL INTERTROCHANTERIC Right 02/07/2015   Procedure: INTRAMEDULLARY (IM) NAIL INTERTROCHANTRIC RIGHT HIP;  Surgeon: Renette Butters, MD;  Location: [facility];  Service: Orthopedics;  Laterality: Right;  . KNEE ARTHROSCOPY Right   . LEFT HEART CATHETERIZATION WITH CORONARY/GRAFT ANGIOGRAM  07/01/2011   Procedure: LEFT HEART CATHETERIZATION WITH Beatrix Fetters;  Surgeon: Sanda Klein, MD;  Location: [facility];  Service: Cardiovascular;;  . LOOP RECORDER IMPLANT  02/15/2014  MDT LINQ implanted by Dr Rayann Heman for cryptogenic stroke  . PERCUTANEOUS CORONARY STENT INTERVENTION (PCI-S) Right 07/01/2011   Procedure: PERCUTANEOUS CORONARY STENT INTERVENTION (PCI-S);  Surgeon: Sanda Klein, MD;  Location: [facility] LAB;  Service: Cardiovascular;  Laterality: Right;  . PICC LINE PLACE PERIPHERAL ([place] HX)  06/2015  . RETINAL DETACHMENT SURGERY Right   . TEE WITHOUT CARDIOVERSION N/A 02/15/2014   Procedure: TRANSESOPHAGEAL ECHOCARDIOGRAM (TEE);  Surgeon: Josue Hector, MD;  Location: [facility];  Service: Cardiovascular;  Laterality: N/A;  .  UMBILICAL HERNIA REPAIR  2006    Current Outpatient Medications:  .  [employer] MELATONIN 10 MG CAPS, TAKE 1 CAPSULE BY MOUTH AT BEDTIME AS NEEDED FOR SLEEP, Disp: 90 capsule, Rfl: 3 .  acetaminophen (TYLENOL) 325 MG tablet, Take 1 tablet (325 mg total) by mouth every 6 (six) hours as needed for mild pain., Disp: , Rfl:  .  albuterol (PROVENTIL HFA;VENTOLIN HFA) 108 (90 Base) MCG/ACT inhaler, Inhale 2 puffs into the lungs every 6 (six) hours as needed for wheezing or shortness of breath., Disp: 1 Inhaler, Rfl: 0 .  apixaban (ELIQUIS) 5 MG TABS tablet, Take 1 tablet (5 mg total) by mouth 2 (two) times daily., Disp: 180 tablet, Rfl: 2 .  Armodafinil 200 MG TABS, TAKE 1 TABLET BY MOUTH EVERY MORNING, Disp: 30 tablet, Rfl: 5 .  cholecalciferol (VITAMIN D) 1000 units tablet, Take 1,000 Units by mouth daily., Disp: , Rfl:  .  doxepin (SINEQUAN) 10 MG capsule, TAKE 1 CAPSULE AT BEDTIME, Disp: 90 capsule, Rfl: 1 .  ELDERBERRY PO, Take by mouth. 3 a day, Disp: , Rfl:  .  ferrous sulfate 325 (65 FE) MG tablet, TAKE 1 TABLET TWICE A DAY WITH FOOD, Disp: 180 tablet, Rfl: 1 .  fluticasone (FLONASE) 50 MCG/ACT nasal spray, Place 2 sprays into both nostrils daily. (Patient taking differently: Place 2 sprays into both nostrils daily as needed. ), Disp: , Rfl:  .  gabapentin (NEURONTIN) 300 MG capsule, Take one or two at bedtime, Disp: 60 capsule, Rfl: 11 .  HYDROcodone-acetaminophen (NORCO/VICODIN) 5-325 MG tablet, Take 1 tablet by mouth daily as needed for moderate pain., Disp: 30 tablet, Rfl: 0 .  hydrocortisone (ANUSOL-HC) 25 MG suppository, Place 1 suppository (25 mg total) rectally 2 (two) times daily as needed. X 1 week; then, use as needed thereafter., Disp: 24 suppository, Rfl: 3 .  KLOR-CON M10 10 MEQ tablet, TAKE 1 TABLETS BY MOUTH 2 TIMES DAILY., Disp: , Rfl:  .  lansoprazole (PREVACID) 30 MG capsule, Take 1 capsule (30 mg total) by mouth 2 (two) times daily before a meal., Disp: 180 capsule, Rfl: 1 .   midodrine (PROAMATINE) 2.5 MG tablet, Take 1 tablet (2.5 mg total) by mouth 2 (two) times daily with a meal., Disp: 180 tablet, Rfl: 3 .  nitroGLYCERIN (NITROSTAT) 0.4 MG SL tablet, Place 1 tablet (0.4 mg total) under the tongue every 5 (five) minutes as needed for chest pain., Disp: , Rfl:  .  NONFORMULARY OR COMPOUNDED ITEM, Supplement menmoringa oleifera taken daily., Disp: , Rfl:  .  nystatin (MYCOSTATIN/NYSTOP) powder, Apply topically 2 (two) times daily., Disp: 60 g, Rfl: 3 .  OVER THE COUNTER MEDICATION, Ceylon Cinnamon- takes 2 a day, 1200 mg, Disp: , Rfl:  .  potassium chloride (K-DUR) 10 MEQ tablet, TAKE 1 TABLETS BY MOUTH 2 TIMES DAILY., Disp: 360 tablet, Rfl: 3 .  predniSONE (DELTASONE) 20 MG tablet, Take 1 tablet (20 mg total) by mouth daily., Disp: 5  tablet, Rfl: 0 .  PRESCRIPTION MEDICATION, Inhale into the lungs at bedtime. CPAP, Disp: , Rfl:  .  prochlorperazine (COMPAZINE) 10 MG tablet, Take 1 tablet (10 mg total) by mouth every 8 (eight) hours as needed (headache, nausea or vomiting)., Disp: 10 tablet, Rfl: 0 .  QC LO-DOSE ASPIRIN 81 MG EC tablet, TAKE 1 TABLET (81 MG TOTAL) BY MOUTH DAILY. SWALLOW WHOLE., Disp: 90 tablet, Rfl: 3 .  QC STOOL SOFTENER PLS LAXATIVE 8.6-50 MG tablet, TAKE 1 TABLET BY MOUTH 2 TIMES DAILY. (Patient taking differently: Take 1 tablet by mouth as needed. ), Disp: 60 tablet, Rfl: 2 .  rosuvastatin (CRESTOR) 40 MG tablet, Take 1 tablet (40 mg total) by mouth daily., Disp: , Rfl:  .  senna (SENOKOT) 8.6 MG tablet, Take 1 tablet by mouth 2 (two) times daily., Disp: , Rfl:  .  sertraline (ZOLOFT) 100 MG tablet, TAKE 1 TABLET BY MOUTH EVERY DAY, Disp: 90 tablet, Rfl: 3 .  tiZANidine (ZANAFLEX) 4 MG capsule, Take 1 capsule (4 mg total) by mouth as needed., Disp: 90 capsule, Rfl: 3 Allergies  Allergen Reactions  . Diphenhydramine Hcl Anaphylaxis  . Adhesive [Tape] Other (See Comments)    Tears skin - please use paper tape  . Proscar [Finasteride] Other (See  Comments)    Lightheaded.   . Tamsulosin Other (See Comments)    Dizzy, vomiting.    @SHSOC @ Objective:   Constitutional FARLEY CROOKER is a pleasant 69 y.o. Caucasian male, morbidly obese in NAD.Marland Kitchen  Vascular Dorsalis pedis pulses palpable bilaterally. Posterior tibial pulses palpable bilaterally. Capillary refill normal to all digits.  No cyanosis or clubbing noted. Pedal hair growth absent b/l LE. +2 edema b/l Evidence of chronic venous insufficiency b/l LE. +Dependent rubor b/l LE.  Neurologic Normal speech. Oriented to person, place, and time. Epicritic sensation to light touch grossly present bilaterally.  Dermatologic Nails 2-5 b/l and right hallux elongated, discolored, dystrophic with pain to palpation. Anonychia left hallux with evidence of total nail avulsion. No open wounds. No skin lesions.  Orthopedic: Normal joint ROM without pain or crepitus bilaterally. Left sided hemiplegia UE/LE. No visible deformities. No bony tenderness. Using transport chair on today's visit.   Radiographs: None Assessment:   1. Pain due to onychomycosis of toenail   2. Hemiplegia as late effect of cerebrovascular accident (CVA) [facility])    Plan:  Patient was evaluated and treated and all questions answered.  Onychomycosis with pain -Nails palliatively debridement as below -Educated on self-care  Procedure: Nail Debridement Rationale: Pain Type of Debridement: manual, sharp debridement. Instrumentation: Nail nipper, rotary burr. Number of Nails: 9 -Examined patient. -Toenails 2-5 bilaterally and R hallux debrided in length and girth without iatrogenic bleeding with sterile nail nipper and dremel.  -Patient to report any pedal injuries to medical professional immediately. -Patient to continue soft, supportive shoe gear daily. -Patient/POA to call should there be question/concern in the interim. Return in about 3 months (around 12/22/2019). Marzetta Board, DPM

## 2019-09-27 ENCOUNTER — Ambulatory Visit: Payer: Medicare Other | Admitting: Physical Therapy

## 2019-09-27 ENCOUNTER — Other Ambulatory Visit: Payer: Self-pay

## 2019-09-27 ENCOUNTER — Encounter: Payer: Self-pay | Admitting: Physical Therapy

## 2019-09-27 DIAGNOSIS — M6281 Muscle weakness (generalized): Secondary | ICD-10-CM | POA: Diagnosis not present

## 2019-09-27 DIAGNOSIS — R296 Repeated falls: Secondary | ICD-10-CM

## 2019-09-27 DIAGNOSIS — R293 Abnormal posture: Secondary | ICD-10-CM

## 2019-09-27 DIAGNOSIS — I69354 Hemiplegia and hemiparesis following cerebral infarction affecting left non-dominant side: Secondary | ICD-10-CM

## 2019-09-27 DIAGNOSIS — M25672 Stiffness of left ankle, not elsewhere classified: Secondary | ICD-10-CM

## 2019-09-27 DIAGNOSIS — M21372 Foot drop, left foot: Secondary | ICD-10-CM

## 2019-09-27 DIAGNOSIS — R2681 Unsteadiness on feet: Secondary | ICD-10-CM

## 2019-09-27 DIAGNOSIS — R2689 Other abnormalities of gait and mobility: Secondary | ICD-10-CM

## 2019-09-27 NOTE — Therapy (Signed)
[facility] [address] [place], [place], [address] Phone: [phone]   Fax:  [phone]  Physical Therapy Treatment  Patient Details  Name: Gregory Wiley MRN: [id number] Date of Birth: 05-Jul-1950 Referring Provider (PT): Felecia Shelling, Nanine Means, MD   Encounter Date: 09/27/2019   PT End of Session - 09/27/19 1551    Visit Number 31    Number of Visits 34    Date for PT Re-Evaluation 10/04/19    Authorization Type [employer] Medicare - 10th visit PN    Progress Note Due on Visit 79    PT Start Time 1445    PT Stop Time 1530    PT Time Calculation (min) 45 min    Equipment Utilized During Treatment Gait belt;Other (comment)   Bioness   Activity Tolerance Patient tolerated treatment well;No increased pain    Behavior During Therapy WFL for tasks assessed/performed           Past Medical History:  Diagnosis Date  . Adrenal insufficiency ([place])   . Anxiety   . Basal cell carcinoma of skin of left ankle   . Bleeding stomach ulcer 2015  . Coronary artery disease    stent, then CABG 07/20/10  . Daily headache    "for the past month" (11/11/2014)  . Depression   . Diverticulosis   . GERD (gastroesophageal reflux disease)   . Gout   . H/O cardiomyopathy    ischemic, now last echo 07/30/11, EF 38%W  . History of hiatal hernia   . Hyperlipidemia   . Hypertension   . Internal hemorrhoids   . Left hemiplegia ([place])   . NSTEMI (non-ST elevated myocardial infarction) ([place])    NSTEMI- last cath 06/2011-Stent to LCX-DES, last nuc 07/30/11 low risk  . Obesity (BMI 30-39.9)   . OSA on CPAP    since July 2013- uses CPAP sometimes , states he has lost 147 lbs. since stroke & doesn't use the CPAP as much as he use to.   . Plantar fasciitis of left foot   . Pneumonia    hosp. 12/2014  . Pulmonary embolism ([place]) 03/2014  . Stroke ([place]) 01/2014   "no control of LUE, can slightly LLE; memory problems since" (11/11/2014)  . Tubular adenoma of  colon     Past Surgical History:  Procedure Laterality Date  . BASAL CELL CARCINOMA EXCISION Left 2015   ankle  . CARDIAC SURGERY    . CATARACT EXTRACTION Bilateral   . COLONOSCOPY N/A 02/10/2013   Procedure: COLONOSCOPY;  Surgeon: Ladene Artist, MD;  [facility];  Service: Endoscopy;  Laterality: N/A;  . CORONARY ANGIOPLASTY WITH STENT PLACEMENT  07/01/2011   DES-Resolute to native LCX  . CORONARY ANGIOPLASTY WITH STENT PLACEMENT  12/01/2007   COMPLEX 5 LESION PCI INCLUDING CUTTING BALLOON AND 4 CYPHER DESs  . CORONARY ARTERY BYPASS GRAFT  07/20/2010    LIMA-LAD; VG-ACUTE MARG of RCA; Seq VG-distal RCA & then pda  . EP IMPLANTABLE DEVICE N/A 02/14/2016   Procedure: Loop Recorder Removal;  Surgeon: Thompson Grayer, MD;  Location: [facility] LAB;  Service: Cardiovascular;  Laterality: N/A;  . ESOPHAGOGASTRODUODENOSCOPY  01/2014   gastric ulcer, erosive gastroduodenitis, H Pylori negative.   . ESOPHAGOGASTRODUODENOSCOPY (EGD) WITH PROPOFOL N/A 11/14/2014   Procedure: ESOPHAGOGASTRODUODENOSCOPY (EGD) WITH PROPOFOL;  Surgeon: Milus Banister, MD;  Location: [facility];  Service: Endoscopy;  Laterality: N/A;  . HAND SURGERY Right    to take glass out  .  HERNIA REPAIR    . INTRAMEDULLARY (IM) NAIL INTERTROCHANTERIC Right 02/07/2015   Procedure: INTRAMEDULLARY (IM) NAIL INTERTROCHANTRIC RIGHT HIP;  Surgeon: Renette Butters, MD;  Location: [facility];  Service: Orthopedics;  Laterality: Right;  . KNEE ARTHROSCOPY Right   . LEFT HEART CATHETERIZATION WITH CORONARY/GRAFT ANGIOGRAM  07/01/2011   Procedure: LEFT HEART CATHETERIZATION WITH Beatrix Fetters;  Surgeon: Sanda Klein, MD;  Location: [facility] CATH LAB;  Service: Cardiovascular;;  . LOOP RECORDER IMPLANT  02/15/2014   MDT LINQ implanted by Dr Rayann Heman for cryptogenic stroke  . PERCUTANEOUS CORONARY STENT INTERVENTION (PCI-S) Right 07/01/2011   Procedure: PERCUTANEOUS CORONARY STENT INTERVENTION (PCI-S);  Surgeon: Sanda Klein,  MD;  Location[facility] LAB;  Service: Cardiovascular;  Laterality: Right;  . PICC LINE PLACE PERIPHERAL ([place] HX)  06/2015  . RETINAL DETACHMENT SURGERY Right   . TEE WITHOUT CARDIOVERSION N/A 02/15/2014   Procedure: TRANSESOPHAGEAL ECHOCARDIOGRAM (TEE);  Surgeon: Josue Hector, MD;  Location: [facility]OSCOPY;  Service: Cardiovascular;  Laterality: N/A;  . UMBILICAL HERNIA REPAIR  2006    There were no vitals filed for this visit.   Subjective Assessment - 09/27/19 1456    Subjective Pt reports this morning he had an episode of being awakened suddenly and having a feeling of getting hot/warm all over (like in CT with contrast) and then getting nauseous.  Has had these episodes before but this time he did not go back to feeling normal.  Denies nausea.  Feels like he is going to fall, "head is messed up."    Patient is accompained by: Family member   spouse   Pertinent History DNR; anxiety & depression, CAD with CABG, diverticulosis, gout, HLD, HTN, left hemiplegia, NSTEMI (2013), morbid obesity, PE (2016), CVA (2015), tubular adenoma of colon, OSA w/CPAP, right hip fx w/ORIF (2016), iron deficiency anemia    Limitations Standing;Walking    Patient Stated Goals to obtain a Bioness for home use; to return to PLOF of ambulating household distances and short community distances with quad cane                             [facility] Adult PT Treatment/Exercise - 09/27/19 1540      Transfers   Transfers Sit to Stand;Stand to [employer] Transfers    Sit to Stand 4: Min assist    Sit to Stand Details (indicate cue type and reason) Pt feeling more off balance today so therapist provided more assistance just as a precaution    Stand to Sit 4: Min guard    Stand Pivot Transfers 4: Min assist    Stand Pivot Transfer Details (indicate cue type and reason) with quad cane; 90 deg transfer to the R from w/c > mat but when returning to w/c to L pt self selects to perform 270 deg turn to R with  cane.  Pt does not pivot all the way and sits with hips offset in w/c.  Discussed safety issues with 270 deg turn vs. 90 deg turn to L- will continue to address      Ambulation/Gait   Ambulation/Gait No    Ambulation/Gait Assistance Details did not perform today due to pt feeling more off balance      Modalities   Modalities Electrical Stimulation      Electrical Stimulation   Electrical Stimulation Location L anterior tibialis and L quad muscles    Electrical Stimulation Action in training mode x 10 minutes  with L foot on rockerboard performing active DF when stimulation on, active PF when stimulation off.  Increased on time to 6 seconds.  Added 5 minutes and focused on knee extension from 90 deg flexion against therapist's manual resistance for open chain knee extension and then changed to closed chain with coming to a squat when stimulation on and holding x 6 seconds x 5 reps with min A    Electrical Stimulation Parameters refer to tablet 1 for adjusted parameters; steering electrodes; updated training mode    Electrical Stimulation Goals Strength;Tone;Edema;Neuromuscular facilitation;Other (comment)   ROM                   PT Short Term Goals - 09/08/19 1719      PT SHORT TERM GOAL #1   Title Pt will be able to perform full HEP with wife's min A    Status On-going    Target Date 09/05/19      PT SHORT TERM GOAL #2   Title Pt will decrease TUG time with [facility] and AFO to </= 80 seconds with min A    Baseline 96 seconds with quad cane and Bioness; will reassess with AFO    Time 4    Period Weeks    Status Partially Met    Target Date 09/05/19      PT SHORT TERM GOAL #4   Title Pt will improve gait velocity (over 10 foot distance) to >/= .5 ft/sec with cane and new AFO    Baseline .17 ft/sec    Time 4    Period Weeks    Status Not Met    Target Date 09/05/19      PT SHORT TERM GOAL #5   Title Pt will consistently perform stand pivot transfers to L and R with cane and  AFO with min A    Time 4    Period Weeks    Status Achieved    Target Date 09/05/19             PT Long Term Goals - 08/17/19 2042      PT LONG TERM GOAL #1   Title Patient and wife demonstrate and verbalizes independence with final HEP    Baseline difficulty finding exercises for pt to do at home, sits and sleeps in recliner; no access to bed    Time 12    Period Weeks    Status Revised    Target Date 10/04/19      PT LONG TERM GOAL #2   Title Pt will improve TUG with cane and Bioness/AFO to </= 60 seconds    Baseline 108 seconds with cane and no AFO    Time 12    Period Weeks    Status Revised    Target Date 10/04/19      PT LONG TERM GOAL #3   Title Patient will improve gait velocity over full distance with quad cane and most appropriate orthosis (bioness or AFO) to >0.8 ft/sec    Baseline .35 over 10 foot distance with cane and no AFO    Time 12    Period Weeks    Status Revised    Target Date 10/04/19      PT LONG TERM GOAL #4   Title Patient demonstrates improved L passive ankle DF by at least 10 degrees.    Baseline lacking 24 deg with knee extended, lacking 10 deg with knee flexed > Lacking 20 deg with knee extended, lacking 16  deg with knee flexed    Time 12    Period Weeks    Status Revised    Target Date 10/04/19      PT LONG TERM GOAL #5   Title Pt will demonstrate ability to transfer w/c <> mat/chair with quad cane, most appropriate orthosis and supervision    Baseline min-mod A with quad cane    Time 12    Period Weeks    Status Revised    Target Date 10/04/19                 Plan - 09/27/19 1551    Clinical Impression Statement Pt reporting feeling more off balance today and didn't not feel able to perform ambulation today; when transferring with therapist, PT only provided extra support for safety but no significant change noted.  Focused on progression of Bioness FES training and strengthening in open and closed chain positions.  Pt  tolerated increase in on time and increase in overall time using without any significant mm fatigue.    Personal Factors and Comorbidities Age;Fitness;Past/Current Experience;Comorbidity 3+;Time since onset of injury/illness/exacerbation    Comorbidities anixety & depression, CAD, diverticulosis, gout, HLD, HTN, left hemiplegia, NSTEMI (2013), morbid obesity, PE (2016), CVA (2015), tubular adenoma of colon, OSA w/CPAP, right hip fx w/ORIF (2016), iron deficiency anemia    Examination-Activity Limitations Squat;Stairs;Stand;Dressing;Transfers;Bed Mobility;Locomotion Level    Examination-Participation Restrictions Church;Community Activity    Stability/Clinical Decision Making Evolving/Moderate complexity    Rehab Potential Good    PT Frequency 2x / week    PT Duration 12 weeks    PT Treatment/Interventions ADLs/Self Care Home Management;Electrical Stimulation;DME Instruction;Balance training;Therapeutic exercise;Orthotic Fit/Training;Gait training;Neuromuscular re-education;Stair training;Functional mobility training;Patient/family education;Therapeutic activities;Manual techniques;Compression bandaging;Passive range of motion    PT Next Visit Plan Check LTG and send to me for recert (early) since I won't be here next week; Bioness in training mode, during Mat exercises in sitting/supine.  No walking with Bioness right now, only AFO.  Continue standing tolerance and endurance, weight shifting, gait training with new AFO.  Power w/c trial and safety assessment?  R sidelying for L hip ROM and strengthening    Consulted and Agree with Plan of Care Patient;Family member/caregiver    Family Member Consulted spouse           Patient will benefit from skilled therapeutic intervention in order to improve the following deficits and impairments:  Abnormal gait, Decreased range of motion, Difficulty walking, Impaired tone, Obesity, Impaired UE functional use, Decreased endurance, Decreased activity tolerance,  Decreased skin integrity, Decreased balance, Decreased knowledge of use of DME, Impaired flexibility, Postural dysfunction, Impaired sensation, Increased edema, Decreased strength, Decreased mobility  Visit Diagnosis: Muscle weakness (generalized)  Other abnormalities of gait and mobility  Stiffness of left ankle, not elsewhere classified  Abnormal posture  Repeated falls  Foot drop, left  Unsteadiness on feet  Spastic hemiplegia of left nondominant side as late effect of cerebral infarction [facility])     Problem List Patient Active Problem List   Diagnosis Date Noted  . Left foot drop 07/14/2019  . GERD (gastroesophageal reflux disease) 06/18/2019  . Tinea cruris 07/20/2017  . Hemorrhoid 07/20/2017  . Irritability 06/08/2017  . Fall at home 05/06/2017  . Advance care planning 11/28/2016  . Skin mass 11/28/2016  . Lower urinary tract symptoms (LUTS) 09/05/2016  . Hearing loss 09/05/2016  . Excessive daytime sleepiness 04/16/2016  . Exposure to TB 02/21/2016  . Vitamin D deficiency 02/21/2016  . Anemia 11/10/2015  .  Left thigh pain 11/10/2015  . Neck pain 10/12/2015  . Hemiplegia affecting left side in left-dominant patient as late effect of cerebrovascular disease ([place]) 09/03/2015  . Medicare annual wellness visit, subsequent 08/14/2015  . Acute pulmonary embolism ([place]) 07/25/2015  . Chest wall pain 07/15/2015  . History of pulmonary embolism 07/15/2015  . Chronic anticoagulation 05/22/2015  . Gait disturbance 05/15/2015  . DNR (do not resuscitate) 01/19/2015  . Hypotension 01/14/2015  . Paresthesia   . Depression 12/14/2014  . OSA (obstructive sleep apnea) 11/30/2014  . Chronic combined systolic and diastolic congestive heart failure ([place]) 11/11/2014  . SOB (shortness of breath) 04/17/2014  . Hemiplegia affecting left dominant side ([place]) 02/16/2014  . 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 06/30/2011     [name], PT, DPT 09/27/19    3:55 PM    [facility] [address] [place], [place], [address] Phone: [phone]   Fax:  [phone]  Name: MONTRELLE EDDINGS MRN: [id number] Date of Birth: 05-21-1950

## 2019-09-29 ENCOUNTER — Encounter: Payer: Self-pay | Admitting: Physical Therapy

## 2019-09-29 ENCOUNTER — Ambulatory Visit: Payer: Medicare Other | Admitting: Physical Therapy

## 2019-09-29 ENCOUNTER — Other Ambulatory Visit: Payer: Self-pay

## 2019-09-29 DIAGNOSIS — R2689 Other abnormalities of gait and mobility: Secondary | ICD-10-CM

## 2019-09-29 DIAGNOSIS — M21372 Foot drop, left foot: Secondary | ICD-10-CM

## 2019-09-29 DIAGNOSIS — M6281 Muscle weakness (generalized): Secondary | ICD-10-CM

## 2019-09-29 DIAGNOSIS — R2681 Unsteadiness on feet: Secondary | ICD-10-CM

## 2019-09-29 DIAGNOSIS — M25672 Stiffness of left ankle, not elsewhere classified: Secondary | ICD-10-CM

## 2019-09-29 DIAGNOSIS — R296 Repeated falls: Secondary | ICD-10-CM

## 2019-09-29 DIAGNOSIS — R293 Abnormal posture: Secondary | ICD-10-CM

## 2019-09-30 NOTE — Therapy (Signed)
Gregory Wiley [address] [place], [place], [address] Phone: [phone]   Fax:  [phone]  Physical Therapy Treatment  Patient Details  Name: Gregory Wiley MRN: [id number] Date of Birth: [birth year] Referring Provider (PT): Felecia Shelling, Nanine Means, MD   Encounter Date: 09/29/2019   PT End of Session - 09/29/19 1539    Visit Number 32    Number of Visits 34    Date for PT Re-Evaluation 10/04/19    Authorization Type [employer] Medicare - 10th visit PN    Progress Note Due on Visit 17    PT Start Time 1535    PT Stop Time 1620    PT Time Calculation (min) 45 min    Equipment Utilized During Treatment Gait belt;Other (comment)   Bioness   Activity Tolerance Patient tolerated treatment well;No increased pain    Behavior During Therapy WFL for tasks assessed/performed           Past Medical History:  Diagnosis Date  . Adrenal insufficiency ([place])   . Anxiety   . Basal cell carcinoma of skin of left ankle   . Bleeding stomach ulcer 2015  . Coronary artery disease    stent, then CABG 07/20/10  . Daily headache    "for the past month" (11/11/2014)  . Depression   . Diverticulosis   . GERD (gastroesophageal reflux disease)   . Gout   . H/O cardiomyopathy    ischemic, now last echo 07/30/11, EF 38%W  . History of hiatal hernia   . Hyperlipidemia   . Hypertension   . Internal hemorrhoids   . Left hemiplegia ([place])   . NSTEMI (non-ST elevated myocardial infarction) ([place])    NSTEMI- last cath 06/2011-Stent to LCX-DES, last nuc 07/30/11 low risk  . Obesity (BMI 30-39.9)   . OSA on CPAP    since July 2013- uses CPAP sometimes , states he has lost 147 lbs. since stroke & doesn't use the CPAP as much as he use to.   . Plantar fasciitis of left foot   . Pneumonia    hosp. 12/2014  . Pulmonary embolism ([place]) 03/2014  . Stroke ([place]) 01/2014   "no control of LUE, can slightly LLE; memory problems since" (11/11/2014)  . Tubular adenoma of  colon     Past Surgical History:  Procedure Laterality Date  . BASAL CELL CARCINOMA EXCISION Left 2015   ankle  . CARDIAC SURGERY    . CATARACT EXTRACTION Bilateral   . COLONOSCOPY N/A 02/10/2013   Procedure: COLONOSCOPY;  Surgeon: Ladene Artist, MD;  Location: [facility];  Service: Endoscopy;  Laterality: N/A;  . CORONARY ANGIOPLASTY WITH STENT PLACEMENT  07/01/2011   DES-Resolute to native LCX  . CORONARY ANGIOPLASTY WITH STENT PLACEMENT  12/01/2007   COMPLEX 5 LESION PCI INCLUDING CUTTING BALLOON AND 4 CYPHER DESs  . CORONARY ARTERY BYPASS GRAFT  07/20/2010    LIMA-LAD; VG-ACUTE MARG of RCA; Seq VG-distal RCA & then pda  . EP IMPLANTABLE DEVICE N/A 02/14/2016   Procedure: Loop Recorder Removal;  Surgeon: Thompson Grayer, MD;  Location: [facility];  Service: Cardiovascular;  Laterality: N/A;  . ESOPHAGOGASTRODUODENOSCOPY  01/2014   gastric ulcer, erosive gastroduodenitis, H Pylori negative.   . ESOPHAGOGASTRODUODENOSCOPY (EGD) WITH PROPOFOL N/A 11/14/2014   Procedure: ESOPHAGOGASTRODUODENOSCOPY (EGD) WITH PROPOFOL;  Surgeon: Milus Banister, MD;  Location: [facility];  Service: Endoscopy;  Laterality: N/A;  . HAND SURGERY Right    to take glass out  .  HERNIA REPAIR    . INTRAMEDULLARY (IM) NAIL INTERTROCHANTERIC Right 02/07/2015   Procedure: INTRAMEDULLARY (IM) NAIL INTERTROCHANTRIC RIGHT HIP;  Surgeon: Renette Butters, MD;  Location: [facility];  Service: Orthopedics;  Laterality: Right;  . KNEE ARTHROSCOPY Right   . LEFT HEART CATHETERIZATION WITH CORONARY/GRAFT ANGIOGRAM  07/01/2011   Procedure: LEFT HEART CATHETERIZATION WITH Beatrix Fetters;  Surgeon: Sanda Klein, MD;  Location: [facility] CATH LAB;  Service: Cardiovascular;;  . LOOP RECORDER IMPLANT  02/15/2014   MDT LINQ implanted by Dr Rayann Heman for cryptogenic stroke  . PERCUTANEOUS CORONARY STENT INTERVENTION (PCI-S) Right 07/01/2011   Procedure: PERCUTANEOUS CORONARY STENT INTERVENTION (PCI-S);  Surgeon: Sanda Klein,  MD;  Location: [facility] CATH LAB;  Service: Cardiovascular;  Laterality: Right;  . PICC LINE PLACE PERIPHERAL ([place] H[facility])  06/2015  . RETINAL DETACHMENT SURGERY Right   . TEE WITHOUT CARDIOVERSION N/A 02/15/2014   Procedure: TRANSESOPHAGEAL ECHOCARDIOGRAM (TEE);  Surgeon: Josue Hector, MD;  Location: [facility] ENDOSCOPY;  Service: Cardiovascular;  Laterality: N/A;  . UMBILICAL HERNIA REPAIR  2006    There were no vitals filed for this visit.   Subjective Assessment - 09/29/19 1537    Subjective Not feeling any better. No change in how he was feeling last session, "head is still messed up".    Patient is accompained by: Family member   spouse   Pertinent History DNR; anxiety & depression, CAD with CABG, diverticulosis, gout, HLD, HTN, left hemiplegia, NSTEMI (2013), morbid obesity, PE (2016), CVA (2015), tubular adenoma of colon, OSA w/CPAP, right hip fx w/ORIF (2016), iron deficiency anemia    Patient Stated Goals to obtain a Bioness for home use; to return to PLOF of ambulating household distances and short community distances with quad cane    Currently in Pain? No/denies    Pain Score 0-No pain              [facility] PT Assessment - 09/29/19 1541      ROM / Strength   AROM / PROM / Strength PROM      PROM   Overall PROM  Deficits    Overall PROM Comments measured in supine (with wedge under back) with knee in extension, hip manually rotated back into neutral.     PROM Assessment Site Ankle    Right/Left Ankle Left    Left Ankle Dorsiflexion -15   PROM; -20 degrees AROM              [facility] Adult PT Treatment/Exercise - 09/29/19 1541      Transfers   Transfers Sit to Stand;Stand to Sit    Sit to Stand 4: Min assist    Stand to Sit 4: Min guard    Stand Pivot Transfers 4: Min Surveyor, minerals Details (indicate cue type and reason) with quad cane, no brace from transport chair to mat, then with cane/brace from mat to transport chair      Ambulation/Gait   Ambulation/Gait  Yes    Ambulation/Gait Assistance 4: Min guard;4: Min assist    Ambulation/Gait Assistance Details cues on posture, step length and weight shifting onto left LE in stance for increased right step length.    Ambulation Distance (Feet) 12 Feet    Assistive device Small based quad cane    Gait Pattern Step-through pattern;Decreased stride length;Decreased hip/knee flexion - left;Decreased dorsiflexion - left;Trunk rotated posteriorly on left;Poor foot clearance - left    Ambulation Surface Level;Indoor      Neuro Re-ed  Neuro Re-ed Details  concurrent with Bioness to left LE: on balance board in ant/post for 20 reps with pt self moving board for DF with on times/rest off times; then long arc quads for 10 reps with on times, rest on off times for 10 reps.                  PT Short Term Goals - 09/08/19 1719      PT SHORT TERM GOAL #1   Title Pt will be able to perform full HEP with wife's min A    Status On-going    Target Date 09/05/19      PT SHORT TERM GOAL #2   Title Pt will decrease TUG time with [facility] and AFO to </= 80 seconds with min A    Baseline 96 seconds with quad cane and Bioness; will reassess with AFO    Time 4    Period Weeks    Status Partially Met    Target Date 09/05/19      PT SHORT TERM GOAL #4   Title Pt will improve gait velocity (over 10 foot distance) to >/= .5 ft/sec with cane and new AFO    Baseline .17 ft/sec    Time 4    Period Weeks    Status Not Met    Target Date 09/05/19      PT SHORT TERM GOAL #5   Title Pt will consistently perform stand pivot transfers to L and R with cane and AFO with min A    Time 4    Period Weeks    Status Achieved    Target Date 09/05/19            PT Long Term Goals - 09/29/19 2158      PT LONG TERM GOAL #1   Title Patient and wife demonstrate and verbalizes independence with final HEP    Baseline 09/29/19: pt and spouse report no issues with HEP issued to date    Status Achieved      PT LONG TERM  GOAL #2   Title Pt will improve TUG with cane and Bioness/AFO to </= 60 seconds    Baseline 09/29/19: in past visits- 93.35 sec's with cane/AFO, and 96 sec's with cane/Bioness, improved from 108 sec's, just not to goal level    Time --    Period --    Status Partially Met      PT LONG TERM GOAL #3   Title Patient will improve gait velocity over full distance with quad cane and most appropriate orthosis (bioness or AFO) to >0.8 ft/sec    Baseline 09/29/19: pt only able to make 12 feet, unable to get accurate reading due to pt needing to sit urgently and timer not stopped in time    Time --    Period --    Status Not Met      PT LONG TERM GOAL #4   Title Patient demonstrates improved L passive ankle DF by at least 10 degrees.    Baseline 09/29/19: lacking 15 degrees with knee extended/hip in neutral passively, lacking 20 degrees in same position actively    Time --    Period --    Status Partially Met      PT LONG TERM GOAL #5   Title Pt will demonstrate ability to transfer w/c <> mat/chair with quad cane, most appropriate orthosis and supervision    Baseline 09/29/19: min guard assist for safety, improved just not to  goal level    Time --    Period --    Status Partially Met               Plan - 09/29/19 1539    Clinical Impression Statement Today's skilled session focused on progress toward LTGs for anticipated recert by primary PT. Pt has progressed with his timed up and go, less assistance needed with transfers and with his left ankle ROM. Will benefit from continued strengthening, standing balance and gait training to reach unmet goals. Primary PT plans to recert. Pt will benefit from continued PT to progress toward unmet goals.    Personal Factors and Comorbidities Age;Fitness;Past/Current Experience;Comorbidity 3+;Time since onset of injury/illness/exacerbation    Comorbidities anixety & depression, CAD, diverticulosis, gout, HLD, HTN, left hemiplegia, NSTEMI (2013), morbid  obesity, PE (2016), CVA (2015), tubular adenoma of colon, OSA w/CPAP, right hip fx w/ORIF (2016), iron deficiency anemia    Examination-Activity Limitations Squat;Stairs;Stand;Dressing;Transfers;Bed Mobility;Locomotion Level    Examination-Participation Restrictions Church;Community Activity    Stability/Clinical Decision Making Evolving/Moderate complexity    Rehab Potential Good    PT Frequency 2x / week    PT Duration 12 weeks    PT Treatment/Interventions ADLs/Self Care Home Management;Electrical Stimulation;DME Instruction;Balance training;Therapeutic exercise;Orthotic Fit/Training;Gait training;Neuromuscular re-education;Stair training;Functional mobility training;Patient/family education;Therapeutic activities;Manual techniques;Compression bandaging;Passive range of motion    PT Next Visit Plan Bioness in training mode, during Mat exercises in sitting/supine.  No walking with Bioness right now, only AFO.  Continue standing tolerance and endurance, weight shifting, gait training with new AFO.  Power w/c trial and safety assessment?  R sidelying for L hip ROM and strengthening    Consulted and Agree with Plan of Care Patient;Family member/caregiver    Family Member Consulted spouse           Patient will benefit from skilled therapeutic intervention in order to improve the following deficits and impairments:  Abnormal gait, Decreased range of motion, Difficulty walking, Impaired tone, Obesity, Impaired UE functional use, Decreased endurance, Decreased activity tolerance, Decreased skin integrity, Decreased balance, Decreased knowledge of use of DME, Impaired flexibility, Postural dysfunction, Impaired sensation, Increased edema, Decreased strength, Decreased mobility  Visit Diagnosis: Other abnormalities of gait and mobility  Muscle weakness (generalized)  Stiffness of left ankle, not elsewhere classified  Abnormal posture  Repeated falls  Foot drop, left  Unsteadiness on  feet     Problem List Patient Active Problem List   Diagnosis Date Noted  . Left foot drop 07/14/2019  . GERD (gastroesophageal reflux disease) 06/18/2019  . Tinea cruris 07/20/2017  . Hemorrhoid 07/20/2017  . Irritability 06/08/2017  . Fall at home 05/06/2017  . Advance care planning 11/28/2016  . Skin mass 11/28/2016  . Lower urinary tract symptoms (LUTS) 09/05/2016  . Hearing loss 09/05/2016  . Excessive daytime sleepiness 04/16/2016  . Exposure to TB 02/21/2016  . Vitamin D deficiency 02/21/2016  . Anemia 11/10/2015  . Left thigh pain 11/10/2015  . Neck pain 10/12/2015  . Hemiplegia affecting left side in left-dominant patient as late effect of cerebrovascular disease ([place]) 09/03/2015  . Medicare annual wellness visit, subsequent 08/14/2015  . Acute pulmonary embolism ([place]) 07/25/2015  . Chest wall pain 07/15/2015  . History of pulmonary embolism 07/15/2015  . Chronic anticoagulation 05/22/2015  . Gait disturbance 05/15/2015  . DNR (do not resuscitate) 01/19/2015  . Hypotension 01/14/2015  . Paresthesia   . Depression 12/14/2014  . OSA (obstructive sleep apnea) 11/30/2014  . Chronic combined systolic and diastolic congestive heart  failure ([place]) 11/11/2014  . SOB (shortness of breath) 04/17/2014  . Hemiplegia affecting left dominant side ([place]) 02/16/2014  . 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 06/30/2011    Willow Ora, PTA, [facility] [address], [place],  [address] [phone] 09/30/19, 9:59 PM   Name: Gregory Wiley MRN: [id number] Date of Birth: 1950/11/12

## 2019-10-14 ENCOUNTER — Ambulatory Visit: Payer: Medicare Other | Admitting: Physical Therapy

## 2019-10-18 ENCOUNTER — Encounter: Payer: Self-pay | Admitting: Physical Therapy

## 2019-10-18 ENCOUNTER — Ambulatory Visit: Payer: Medicare Other | Attending: Neurology | Admitting: Physical Therapy

## 2019-10-18 ENCOUNTER — Other Ambulatory Visit: Payer: Self-pay

## 2019-10-18 DIAGNOSIS — M6281 Muscle weakness (generalized): Secondary | ICD-10-CM | POA: Insufficient documentation

## 2019-10-18 DIAGNOSIS — R2689 Other abnormalities of gait and mobility: Secondary | ICD-10-CM

## 2019-10-18 DIAGNOSIS — M21372 Foot drop, left foot: Secondary | ICD-10-CM | POA: Insufficient documentation

## 2019-10-18 DIAGNOSIS — R2681 Unsteadiness on feet: Secondary | ICD-10-CM | POA: Insufficient documentation

## 2019-10-18 DIAGNOSIS — R293 Abnormal posture: Secondary | ICD-10-CM | POA: Diagnosis present

## 2019-10-18 DIAGNOSIS — M25672 Stiffness of left ankle, not elsewhere classified: Secondary | ICD-10-CM | POA: Insufficient documentation

## 2019-10-18 DIAGNOSIS — I69354 Hemiplegia and hemiparesis following cerebral infarction affecting left non-dominant side: Secondary | ICD-10-CM | POA: Insufficient documentation

## 2019-10-18 DIAGNOSIS — R296 Repeated falls: Secondary | ICD-10-CM | POA: Insufficient documentation

## 2019-10-19 NOTE — Therapy (Signed)
[facility] [address] [place], [place], [address] Phone: [phone]   Fax:  [phone]  Physical Therapy Treatment  Patient Details  Name: Gregory Wiley MRN: [id number] Date of Birth: [birth year] Referring Provider (PT): Felecia Shelling, Nanine Means, MD   Encounter Date: 10/18/2019   PT End of Session - 10/19/19 1644    Visit Number 33    Number of Visits 50    Date for PT Re-Evaluation 12/18/19    Authorization Type [employer] Medicare - 10th visit PN    Progress Note Due on Visit 32    PT Start Time 1624    PT Stop Time 1704    PT Time Calculation (min) 40 min    Equipment Utilized During Treatment Gait belt    Activity Tolerance Patient tolerated treatment well;No increased pain    Behavior During Therapy WFL for tasks assessed/performed           Past Medical History:  Diagnosis Date  . Adrenal insufficiency ([place])   . Anxiety   . Basal cell carcinoma of skin of left ankle   . Bleeding stomach ulcer 2015  . Coronary artery disease    stent, then CABG 07/20/10  . Daily headache    "for the past month" (11/11/2014)  . Depression   . Diverticulosis   . GERD (gastroesophageal reflux disease)   . Gout   . H/O cardiomyopathy    ischemic, now last echo 07/30/11, EF 38%W  . History of hiatal hernia   . Hyperlipidemia   . Hypertension   . Internal hemorrhoids   . Left hemiplegia ([place])   . NSTEMI (non-ST elevated myocardial infarction) ([place])    NSTEMI- last cath 06/2011-Stent to LCX-DES, last nuc 07/30/11 low risk  . Obesity (BMI 30-39.9)   . OSA on CPAP    since July 2013- uses CPAP sometimes , states he has lost 147 lbs. since stroke & doesn't use the CPAP as much as he use to.   . Plantar fasciitis of left foot   . Pneumonia    hosp. 12/2014  . Pulmonary embolism ([place]) 03/2014  . Stroke ([place]) 01/2014   "no control of LUE, can slightly LLE; memory problems since" (11/11/2014)  . Tubular adenoma of colon     Past  Surgical History:  Procedure Laterality Date  . BASAL CELL CARCINOMA EXCISION Left 2015   ankle  . CARDIAC SURGERY    . CATARACT EXTRACTION Bilateral   . COLONOSCOPY N/A 02/10/2013   Procedure: COLONOSCOPY;  Surgeon: Ladene Artist, MD;  Location: [facility];  Service: Endoscopy;  Laterality: N/A;  . CORONARY ANGIOPLASTY WITH STENT PLACEMENT  07/01/2011   DES-Resolute to native LCX  . CORONARY ANGIOPLASTY WITH STENT PLACEMENT  12/01/2007   COMPLEX 5 LESION PCI INCLUDING CUTTING BALLOON AND 4 CYPHER DESs  . CORONARY ARTERY BYPASS GRAFT  07/20/2010    LIMA-LAD; VG-ACUTE MARG of RCA; Seq VG-distal RCA & then pda  . EP IMPLANTABLE DEVICE N/A 02/14/2016   Procedure: Loop Recorder Removal;  Surgeon: Thompson Grayer, MD;  Location: [facility] LAB;  Service: Cardiovascular;  Laterality: N/A;  . ESOPHAGOGASTRODUODENOSCOPY  01/2014   gastric ulcer, erosive gastroduodenitis, H Pylori negative.   . ESOPHAGOGASTRODUODENOSCOPY (EGD) WITH PROPOFOL N/A 11/14/2014   Procedure: ESOPHAGOGASTRODUODENOSCOPY (EGD) WITH PROPOFOL;  Surgeon: Milus Banister, MD;  Location: [facility];  Service: Endoscopy;  Laterality: N/A;  . HAND SURGERY Right    to take glass out  . HERNIA REPAIR    .  INTRAMEDULLARY (IM) NAIL INTERTROCHANTERIC Right 02/07/2015   Procedure: INTRAMEDULLARY (IM) NAIL INTERTROCHANTRIC RIGHT HIP;  Surgeon: Renette Butters, MD;  Location: [facility];  Service: Orthopedics;  Laterality: Right;  . KNEE ARTHROSCOPY Right   . LEFT HEART CATHETERIZATION WITH CORONARY/GRAFT ANGIOGRAM  07/01/2011   Procedure: LEFT HEART CATHETERIZATION WITH Beatrix Fetters;  Surgeon: Sanda Klein, MD;  Location: [facility] CATH LAB;  Service: Cardiovascular;;  . LOOP RECORDER IMPLANT  02/15/2014   MDT LINQ implanted by Dr Rayann Heman for cryptogenic stroke  . PERCUTANEOUS CORONARY STENT INTERVENTION (PCI-S) Right 07/01/2011   Procedure: PERCUTANEOUS CORONARY STENT INTERVENTION (PCI-S);  Surgeon: Sanda Klein, MD;  Location: [facility]  CATH LAB;  Service: Cardiovascular;  Laterality: Right;  . PICC LINE PLACE PERIPHERAL ([place] HX)  06/2015  . RETINAL DETACHMENT SURGERY Right   . TEE WITHOUT CARDIOVERSION N/A 02/15/2014   Procedure: TRANSESOPHAGEAL ECHOCARDIOGRAM (TEE);  Surgeon: Josue Hector, MD;  Location: [facility] ENDOSCOPY;  Service: Cardiovascular;  Laterality: N/A;  . UMBILICAL HERNIA REPAIR  2006    There were no vitals filed for this visit.   Subjective Assessment - 10/18/19 1630    Subjective Niece passed away from cancer last week so had to cancel sessions.  Has not been walking much at home due to LLE giving out.  Still has not been able to go to [employer] to look into 6E shoes.  Bioness in person consult moved to 8/25.  Pt also had a fall when transferring, fell to the side.    Patient is accompained by: Family member   spouse   Pertinent History DNR; anxiety & depression, CAD with CABG, diverticulosis, gout, HLD, HTN, left hemiplegia, NSTEMI (2013), morbid obesity, PE (2016), CVA (2015), tubular adenoma of colon, OSA w/CPAP, right hip fx w/ORIF (2016), iron deficiency anemia    Patient Stated Goals to obtain a Bioness for home use; to return to PLOF of ambulating household distances and short community distances with quad cane    Currently in Pain? No/denies                             [facility] Adult PT Treatment/Exercise - 10/18/19 1634      Bed Mobility   Bed Mobility Sit to Supine;Supine to Sit    Supine to Sit Maximal Assistance - Patient - Patient 25-49%    Sit to Supine Moderate Assistance - Patient 50-74%      Transfers   Transfers Sit to Stand;Stand to Sit    Sit to Stand 4: Min assist;3: Mod assist    Sit to Stand Details (indicate cue type and reason) Mod A and multiple attempts to stand from wheelchair.  Min A to stand from mat after stretching and exercises with cues to "push down through left heel/foot" for activation of LLE extensors during sit > stand.    Stand to Sit 4: Min guard     Stand Pivot Transfers 4: Min assist    Stand Pivot Transfer Details (indicate cue type and reason) with quad cane; pt did not pivot fully prior to sitting      Ambulation/Gait   Ambulation/Gait Yes    Ambulation/Gait Assistance 4: Min assist    Ambulation/Gait Assistance Details cues for upright posture and for activation of knee extension and hip extension on from initial > mid stance.  Pt fatigued very quickly.    Ambulation Distance (Feet) 5 Feet    Assistive device Small based quad cane  Gait Pattern Step-through pattern;Decreased stride length;Decreased hip/knee flexion - left;Decreased dorsiflexion - left;Poor foot clearance - left;Step-to pattern;Decreased step length - right;Decreased stance time - left;Decreased weight shift to left    Ambulation Surface Level;Indoor      Knee/Hip Exercises: Stretches   Ambulance person reps;10 seconds    Sports administrator Limitations quad, hip flexor and adductor stretch combined off side of mat with knee flexion; pt only able to tolerate 10 seconds x 2    Hip Flexor Stretch Left;2 reps;10 seconds    Hip Flexor Stretch Limitations quad, hip flexor and adductor stretch combined off side of mat with knee flexion; pt only able to tolerate 10 seconds x 2      Knee/Hip Exercises: Supine   Quad Sets Strengthening;Left;2 sets;10 reps    Other Supine Knee/Hip Exercises With LLE straight performed concentric and isometric hip internal rotation activation with therapist providing over pressure for stretch of hip external rotator muscles x 10 reps with 5 second hold    Other Supine Knee/Hip Exercises Lower trunk rotations when first lying supine to attempt to relax lower back and hip muscles prior to performing stretches and supine strengthening exercises                  PT Education - 10/19/19 1643    Education Details Bioness in person consult moved to 8/25    Person(s) Educated Patient;Spouse    Methods Explanation    Comprehension  Verbalized understanding            PT Short Term Goals - 09/08/19 1719      PT SHORT TERM GOAL #1   Title Pt will be able to perform full HEP with wife's min A    Status On-going    Target Date 09/05/19      PT SHORT TERM GOAL #2   Title Pt will decrease TUG time with [facility] and AFO to </= 80 seconds with min A    Baseline 96 seconds with quad cane and Bioness; will reassess with AFO    Time 4    Period Weeks    Status Partially Met    Target Date 09/05/19      PT SHORT TERM GOAL #4   Title Pt will improve gait velocity (over 10 foot distance) to >/= .5 ft/sec with cane and new AFO    Baseline .17 ft/sec    Time 4    Period Weeks    Status Not Met    Target Date 09/05/19      PT SHORT TERM GOAL #5   Title Pt will consistently perform stand pivot transfers to L and R with cane and AFO with min A    Time 4    Period Weeks    Status Achieved    Target Date 09/05/19             PT Long Term Goals - 09/29/19 2158      PT LONG TERM GOAL #1   Title Patient and wife demonstrate and verbalizes independence with final HEP    Baseline 09/29/19: pt and spouse report no issues with HEP issued to date    Status Achieved      PT LONG TERM GOAL #2   Title Pt will improve TUG with cane and Bioness/AFO to </= 60 seconds    Baseline 09/29/19: in past visits- 93.35 sec's with cane/AFO, and 96 sec's with cane/Bioness, improved from 108 sec's, just not to goal level  Time --    Period --    Status Partially Met      PT LONG TERM GOAL #3   Title Patient will improve gait velocity over full distance with quad cane and most appropriate orthosis (bioness or AFO) to >0.8 ft/sec    Baseline 09/29/19: pt only able to make 12 feet, unable to get accurate reading due to pt needing to sit urgently and timer not stopped in time    Time --    Period --    Status Not Met      PT LONG TERM GOAL #4   Title Patient demonstrates improved L passive ankle DF by at least 10 degrees.    Baseline  09/29/19: lacking 15 degrees with knee extended/hip in neutral passively, lacking 20 degrees in same position actively    Time --    Period --    Status Partially Met      PT LONG TERM GOAL #5   Title Pt will demonstrate ability to transfer w/c <> mat/chair with quad cane, most appropriate orthosis and supervision    Baseline 09/29/19: min guard assist for safety, improved just not to goal level    Time --    Period --    Status Partially Met          New Goals for Recertification:     PT Short Term Goals - 10/19/19 1717      PT SHORT TERM GOAL #1   Title Pt will participate in [place] consult with clinical specialist to determine if pt will be appropriate for home Bioness    Baseline will be on 8/25    Time 4    Period Weeks    Status New    Target Date 11/18/19      PT SHORT TERM GOAL #2   Title Pt will decrease TUG time with [facility] and AFO to </= 80 seconds with min A    Baseline 93 seconds with quad cane and AFO    Time 4    Period Weeks    Status Revised    Target Date 11/18/19      PT SHORT TERM GOAL #3   Title Pt will be able to transfer w/c <> mat to L and R with quad cane, AFO, new shoe and Min Guard    Time 4    Period Weeks    Status New    Target Date 11/18/19      PT SHORT TERM GOAL #4   Title Pt will be able to perform gait velocity assessment along full distance with cane, AFO and new shoe    Baseline Not assessed    Time 4    Period Weeks    Status New    Target Date 11/18/19           PT Long Term Goals - 10/19/19 1721      PT LONG TERM GOAL #1   Title Wife will demonstrate ability to don/doff AFO with new shoe and inspect skin for pressure areas or blisters    Time 8    Period Weeks    Status New    Target Date 12/18/19      PT LONG TERM GOAL #2   Title Pt will improve TUG with cane and AFO to </= 70 seconds    Time 8    Period Weeks    Status Revised    Target Date 12/18/19      PT LONG  TERM GOAL #3   Title Patient will improve  gait velocity over full distance with quad cane and AFO to >0.8 ft/sec    Time 8    Period Weeks    Status Revised    Target Date 12/18/19      PT LONG TERM GOAL #4   Title Bioness goal if indicated after consultation      PT LONG TERM GOAL #5   Title Pt will demonstrate ability to transfer w/c <> mat/chair with quad cane, AFO to L and R consistently with supervision    Time 8    Period Weeks    Status Revised    Target Date 12/18/19                Plan - 10/19/19 1706    Clinical Impression Statement Pt has had slight decline in function from 2 weeks ago; pt reports a fall at home and decreased ambulation at home due to LLE "giving out".  Pt's LLE does demonstrate decreased ROM and initially pt unable to activate L quad muscle but with repetition and cues pt able to activate quad during quad sets and sit > stand.  No evidence of knee instability during transfers and ambulation.  Pt continues to fatigue very quickly.  Patient only partially met LTG; Lack of ability to perform stretches/exercise at home, lack of shoe for AFO, inability to practice gait at home and patient's limited activity tolerance significantly limit his progress.  Pt is demonstrating improved response to Bioness functional electrical stimulation with increased ankle DF activation and ROM.  Pt will benefit from continued skilled PT services to continue to provide training for safe transfers and household ambulation short distances once pt is able to purchase appropriate shoe for AFO and Bioness training if appropriate.    Personal Factors and Comorbidities Age;Fitness;Past/Current Experience;Comorbidity 3+;Time since onset of injury/illness/exacerbation    Comorbidities anixety & depression, CAD, diverticulosis, gout, HLD, HTN, left hemiplegia, NSTEMI (2013), morbid obesity, PE (2016), CVA (2015), tubular adenoma of colon, OSA w/CPAP, right hip fx w/ORIF (2016), iron deficiency anemia    Examination-Activity Limitations  Squat;Stairs;Stand;Dressing;Transfers;Bed Mobility;Locomotion Level    Examination-Participation Restrictions Church;Community Activity    Stability/Clinical Decision Making Evolving/Moderate complexity    Rehab Potential Good    PT Frequency 2x / week    PT Duration 8 weeks    PT Treatment/Interventions ADLs/Self Care Home Management;Electrical Stimulation;DME Instruction;Balance training;Therapeutic exercise;Orthotic Fit/Training;Gait training;Neuromuscular re-education;Stair training;Functional mobility training;Patient/family education;Therapeutic activities;Manual techniques;Compression bandaging;Passive range of motion    PT Next Visit Plan Need to discuss plan with pt and wife; go on hold until can get shoe?  Bioness in training mode, during Mat exercises in sitting/supine.  No walking with Bioness right now, only AFO.  Continue standing tolerance and endurance, weight shifting, gait training with new AFO.  Power w/c trial and safety assessment?  R sidelying for L hip ROM and strengthening    Consulted and Agree with Plan of Care Patient;Family member/caregiver    Family Member Consulted spouse           Patient will benefit from skilled therapeutic intervention in order to improve the following deficits and impairments:  Abnormal gait, Decreased range of motion, Difficulty walking, Impaired tone, Obesity, Impaired UE functional use, Decreased endurance, Decreased activity tolerance, Decreased skin integrity, Decreased balance, Decreased knowledge of use of DME, Impaired flexibility, Postural dysfunction, Impaired sensation, Increased edema, Decreased strength, Decreased mobility  Visit Diagnosis: Other abnormalities of gait and mobility  Muscle weakness (  generalized)  Stiffness of left ankle, not elsewhere classified  Abnormal posture  Repeated falls  Foot drop, left  Unsteadiness on feet  Spastic hemiplegia of left nondominant side as late effect of cerebral infarction  [facility])     Problem List Patient Active Problem List   Diagnosis Date Noted  . Left foot drop 07/14/2019  . GERD (gastroesophageal reflux disease) 06/18/2019  . Tinea cruris 07/20/2017  . Hemorrhoid 07/20/2017  . Irritability 06/08/2017  . Fall at home 05/06/2017  . Advance care planning 11/28/2016  . Skin mass 11/28/2016  . Lower urinary tract symptoms (LUTS) 09/05/2016  . Hearing loss 09/05/2016  . Excessive daytime sleepiness 04/16/2016  . Exposure to TB 02/21/2016  . Vitamin D deficiency 02/21/2016  . Anemia 11/10/2015  . Left thigh pain 11/10/2015  . Neck pain 10/12/2015  . Hemiplegia affecting left side in left-dominant patient as late effect of cerebrovascular disease ([place]) 09/03/2015  . Medicare annual wellness visit, subsequent 08/14/2015  . Acute pulmonary embolism ([place]) 07/25/2015  . Chest wall pain 07/15/2015  . History of pulmonary embolism 07/15/2015  . Chronic anticoagulation 05/22/2015  . Gait disturbance 05/15/2015  . DNR (do not resuscitate) 01/19/2015  . Hypotension 01/14/2015  . Paresthesia   . Depression 12/14/2014  . OSA (obstructive sleep apnea) 11/30/2014  . Chronic combined systolic and diastolic congestive heart failure ([place]) 11/11/2014  . SOB (shortness of breath) 04/17/2014  . Hemiplegia affecting left dominant side ([place]) 02/16/2014  . 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 06/30/2011    [name], PT, DPT 10/19/19    5:24 PM    [facility] [address] [place], [place], [address] Phone: [phone]   Fax:  [phone]  Name: BLESSED COTHAM MRN: [id number] Date of Birth: [birth year]

## 2019-10-21 ENCOUNTER — Encounter: Payer: Self-pay | Admitting: Physical Therapy

## 2019-10-21 ENCOUNTER — Ambulatory Visit: Payer: Medicare Other | Admitting: Physical Therapy

## 2019-10-21 ENCOUNTER — Other Ambulatory Visit: Payer: Self-pay

## 2019-10-21 DIAGNOSIS — M6281 Muscle weakness (generalized): Secondary | ICD-10-CM

## 2019-10-21 DIAGNOSIS — R296 Repeated falls: Secondary | ICD-10-CM

## 2019-10-21 DIAGNOSIS — R293 Abnormal posture: Secondary | ICD-10-CM

## 2019-10-21 DIAGNOSIS — M21372 Foot drop, left foot: Secondary | ICD-10-CM

## 2019-10-21 DIAGNOSIS — R2681 Unsteadiness on feet: Secondary | ICD-10-CM

## 2019-10-21 DIAGNOSIS — M25672 Stiffness of left ankle, not elsewhere classified: Secondary | ICD-10-CM

## 2019-10-21 DIAGNOSIS — R2689 Other abnormalities of gait and mobility: Secondary | ICD-10-CM

## 2019-10-21 DIAGNOSIS — I69354 Hemiplegia and hemiparesis following cerebral infarction affecting left non-dominant side: Secondary | ICD-10-CM

## 2019-10-21 NOTE — Therapy (Signed)
[facility] [address] [place], [place], [address] Phone: [phone]   Fax:  [phone]  Physical Therapy Treatment  Patient Details  Name: Gregory Wiley MRN: [id number] Date of Birth: 1951/01/04 Referring Provider (PT): Felecia Shelling, Nanine Means, MD   Encounter Date: 10/21/2019   PT End of Session - 10/21/19 1756    Visit Number 34    Number of Visits 50    Date for PT Re-Evaluation 12/18/19    Authorization Type [employer] Medicare - 10th visit PN    Progress Note Due on Visit 31    PT Start Time 1320    PT Stop Time 1358   ended early to go to the bathroom   PT Time Calculation (min) 38 min    Equipment Utilized During Treatment Gait belt;Other (comment)   Bioness   Activity Tolerance Patient tolerated treatment well;No increased pain    Behavior During Therapy WFL for tasks assessed/performed           Past Medical History:  Diagnosis Date  . Adrenal insufficiency ([place])   . Anxiety   . Basal cell carcinoma of skin of left ankle   . Bleeding stomach ulcer 2015  . Coronary artery disease    stent, then CABG 07/20/10  . Daily headache    "for the past month" (11/11/2014)  . Depression   . Diverticulosis   . GERD (gastroesophageal reflux disease)   . Gout   . H/O cardiomyopathy    ischemic, now last echo 07/30/11, EF 38%W  . History of hiatal hernia   . Hyperlipidemia   . Hypertension   . Internal hemorrhoids   . Left hemiplegia ([place])   . NSTEMI (non-ST elevated myocardial infarction) ([place])    NSTEMI- last cath 06/2011-Stent to LCX-DES, last nuc 07/30/11 low risk  . Obesity (BMI 30-39.9)   . OSA on CPAP    since July 2013- uses CPAP sometimes , states he has lost 147 lbs. since stroke & doesn't use the CPAP as much as he use to.   . Plantar fasciitis of left foot   . Pneumonia    hosp. 12/2014  . Pulmonary embolism ([place]) 03/2014  . Stroke ([place]) 01/2014   "no control of LUE, can slightly LLE; memory problems since"  (11/11/2014)  . Tubular adenoma of colon     Past Surgical History:  Procedure Laterality Date  . BASAL CELL CARCINOMA EXCISION Left 2015   ankle  . CARDIAC SURGERY    . CATARACT EXTRACTION Bilateral   . COLONOSCOPY N/A 02/10/2013   Procedure: COLONOSCOPY;  Surgeon: Ladene Artist, MD;  Location: [facility];  Service: Endoscopy;  Laterality: N/A;  . CORONARY ANGIOPLASTY WITH STENT PLACEMENT  07/01/2011   DES-Resolute to native LCX  . CORONARY ANGIOPLASTY WITH STENT PLACEMENT  12/01/2007   COMPLEX 5 LESION PCI INCLUDING CUTTING BALLOON AND 4 CYPHER DESs  . CORONARY ARTERY BYPASS GRAFT  07/20/2010    LIMA-LAD; VG-ACUTE MARG of RCA; Seq VG-distal RCA & then pda  . EP IMPLANTABLE DEVICE N/A 02/14/2016   Procedure: Loop Recorder Removal;  Surgeon: Thompson Grayer, MD;  Location: [facility] LAB;  Service: Cardiovascular;  Laterality: N/A;  . ESOPHAGOGASTRODUODENOSCOPY  01/2014   gastric ulcer, erosive gastroduodenitis, H Pylori negative.   . ESOPHAGOGASTRODUODENOSCOPY (EGD) WITH PROPOFOL N/A 11/14/2014   Procedure: ESOPHAGOGASTRODUODENOSCOPY (EGD) WITH PROPOFOL;  Surgeon: Milus Banister, MD;  Location: [facility];  Service: Endoscopy;  Laterality: N/A;  . HAND SURGERY Right  to take glass out  . HERNIA REPAIR    . INTRAMEDULLARY (IM) NAIL INTERTROCHANTERIC Right 02/07/2015   Procedure: INTRAMEDULLARY (IM) NAIL INTERTROCHANTRIC RIGHT HIP;  Surgeon: Renette Butters, MD;  Location: [facility];  Service: Orthopedics;  Laterality: Right;  . KNEE ARTHROSCOPY Right   . LEFT HEART CATHETERIZATION WITH CORONARY/GRAFT ANGIOGRAM  07/01/2011   Procedure: LEFT HEART CATHETERIZATION WITH Beatrix Fetters;  Surgeon: Sanda Klein, MD;  Location: [facility] CATH LAB;  Service: Cardiovascular;;  . LOOP RECORDER IMPLANT  02/15/2014   MDT LINQ implanted by Dr Rayann Heman for cryptogenic stroke  . PERCUTANEOUS CORONARY STENT INTERVENTION (PCI-S) Right 07/01/2011   Procedure: PERCUTANEOUS CORONARY STENT INTERVENTION  (PCI-S);  Surgeon: Sanda Klein, MD;  Location: [facility] CATH LAB;  Service: Cardiovascular;  Laterality: Right;  . PICC LINE PLACE PERIPHERAL ([place] HX)  06/2015  . RETINAL DETACHMENT SURGERY Right   . TEE WITHOUT CARDIOVERSION N/A 02/15/2014   Procedure: TRANSESOPHAGEAL ECHOCARDIOGRAM (TEE);  Surgeon: Josue Hector, MD;  Location: [facility] ENDOSCOPY;  Service: Cardiovascular;  Laterality: N/A;  . UMBILICAL HERNIA REPAIR  2006    There were no vitals filed for this visit.   Subjective Assessment - 10/21/19 1327    Subjective LLE feels better after stretching.  Standing was better at home.  No falls.    Patient is accompained by: Family member   spouse   Pertinent History DNR; anxiety & depression, CAD with CABG, diverticulosis, gout, HLD, HTN, left hemiplegia, NSTEMI (2013), morbid obesity, PE (2016), CVA (2015), tubular adenoma of colon, OSA w/CPAP, right hip fx w/ORIF (2016), iron deficiency anemia    Patient Stated Goals to obtain a Bioness for home use; to return to PLOF of ambulating household distances and short community distances with quad cane    Currently in Pain? No/denies                             [facility] Adult PT Treatment/Exercise - 10/21/19 1328      Transfers   Transfers Sit to Stand;Stand to [employer] Transfers    Sit to Stand 3: Mod assist;4: Min assist    Sit to Stand Details (indicate cue type and reason) from transport chair and mat with increased assistance to shift weight anteriorly during sit > stand.  When performing sit <> stand training from elevated mat with Bioness in training mode for quad strengthening pt only required min A to stand - provided cues for weight shift over LLE to increase WB and activation of LLE during sit > stand and stand > sit.  Performed with 10 seconds on, 20 off, 5 reps before pt reporting a need to use the bathroom    Stand to Sit 4: Min guard    Stand Pivot Transfers 4: Min assist    Stand Pivot Transfer Details (indicate  cue type and reason) with quad cane; performed to R and to L today with therapist assisting when transferring back to L to assist with LLE ABD and placement; cues for weight shifting over LLE      Knee/Hip Exercises: Seated   Long Arc Quad Strengthening;Left;1 set;10 reps    Long Arc Quad Limitations With Bioness on in training mode, 6 seconds on, 8 seconds off     Sit to [employer] 1 set;5 reps;with UE support      Modalities   Modalities Teacher, English as a foreign language Location L anterior tibialis  and L quad muscles    Electrical Stimulation Action New electrodes today; unable to tolerate stimulation of lower cuff for ankle DF even with settings adjusted and intensity lowered.  Pt reports it as a strong burning sensation, being stabbed.  Turned intensity down to 5 and pt still not able to tolerate.  Continued with quad only today for open and closed chain knee extension    Electrical Stimulation Parameters See tablet 1, steering electrode    Electrical Stimulation Goals Strength;Tone;Edema;Neuromuscular facilitation;Other (comment)                    PT Short Term Goals - 10/19/19 1717      PT SHORT TERM GOAL #1   Title Pt will participate in Bioness consult with clinical specialist to determine if pt will be appropriate for home Bioness    Baseline will be on 8/25    Time 4    Period Weeks    Status New    Target Date 11/18/19      PT SHORT TERM GOAL #2   Title Pt will decrease TUG time with LBQC and AFO to </= 80 seconds with min A    Baseline 93 seconds with quad cane and AFO    Time 4    Period Weeks    Status Revised    Target Date 11/18/19      PT SHORT TERM GOAL #3   Title Pt will be able to transfer w/c <> mat to L and R with quad cane, AFO, new shoe and Min Guard    Time 4    Period Weeks    Status New    Target Date 11/18/19      PT SHORT TERM GOAL #4   Title Pt will be able to perform gait velocity assessment  along full distance with cane, AFO and new shoe    Baseline Not assessed    Time 4    Period Weeks    Status New    Target Date 11/18/19             PT Long Term Goals - 10/19/19 1721      PT LONG TERM GOAL #1   Title Wife will demonstrate ability to don/doff AFO with new shoe and inspect skin for pressure areas or blisters    Time 8    Period Weeks    Status New    Target Date 12/18/19      PT LONG TERM GOAL #2   Title Pt will improve TUG with cane and AFO to </= 70 seconds    Time 8    Period Weeks    Status Revised    Target Date 12/18/19      PT LONG TERM GOAL #3   Title Patient will improve gait velocity over full distance with quad cane and AFO to >0.8 ft/sec    Time 8    Period Weeks    Status Revised    Target Date 12/18/19      PT LONG TERM GOAL #4   Title Bioness goal if indicated after consultation      PT LONG TERM GOAL #5   Title Pt will demonstrate ability to transfer w/c <> mat/chair with quad cane, AFO to L and R consistently with supervision    Time 8    Period Weeks    Status Revised    Target Date 12/18/19  Plan - 10/21/19 1757    Clinical Impression Statement Unable to use Bioness for ankle DF training today due to pt demonstrating increased sensitivity to stimulation and unable to tolerate.  Proceeded with using upper cuff for quad strengthening in open and closed chain. Pt able to transfer to L today but with continued assistance from therapist to ABD LLE to L and weight shift.  Will continue to address and progress towards LTG.    Personal Factors and Comorbidities Age;Fitness;Past/Current Experience;Comorbidity 3+;Time since onset of injury/illness/exacerbation    Comorbidities anixety & depression, CAD, diverticulosis, gout, HLD, HTN, left hemiplegia, NSTEMI (2013), morbid obesity, PE (2016), CVA (2015), tubular adenoma of colon, OSA w/CPAP, right hip fx w/ORIF (2016), iron deficiency anemia    Examination-Activity  Limitations Squat;Stairs;Stand;Dressing;Transfers;Bed Mobility;Locomotion Level    Examination-Participation Restrictions Church;Community Activity    Stability/Clinical Decision Making Evolving/Moderate complexity    Rehab Potential Good    PT Frequency 2x / week    PT Duration 8 weeks    PT Treatment/Interventions ADLs/Self Care Home Management;Electrical Stimulation;DME Instruction;Balance training;Therapeutic exercise;Orthotic Fit/Training;Gait training;Neuromuscular re-education;Stair training;Functional mobility training;Patient/family education;Therapeutic activities;Manual techniques;Compression bandaging;Passive range of motion    PT Next Visit Plan Did they get shoe?  Bioness in training mode, during Mat exercises in sitting/supine if he can tolerate ankle, sit > stand training with Bioness.  No walking with Bioness right now, only AFO.  Continue standing tolerance and endurance, weight shifting, gait training with new AFO.  Power w/c trial and safety assessment?  R sidelying for L hip ROM and strengthening    Consulted and Agree with Plan of Care Patient;Family member/caregiver    Family Member Consulted spouse           Patient will benefit from skilled therapeutic intervention in order to improve the following deficits and impairments:  Abnormal gait, Decreased range of motion, Difficulty walking, Impaired tone, Obesity, Impaired UE functional use, Decreased endurance, Decreased activity tolerance, Decreased skin integrity, Decreased balance, Decreased knowledge of use of DME, Impaired flexibility, Postural dysfunction, Impaired sensation, Increased edema, Decreased strength, Decreased mobility  Visit Diagnosis: Other abnormalities of gait and mobility  Muscle weakness (generalized)  Unsteadiness on feet  Stiffness of left ankle, not elsewhere classified  Abnormal posture  Repeated falls  Foot drop, left  Spastic hemiplegia of left nondominant side as late effect of  cerebral infarction [facility])     Problem List Patient Active Problem List   Diagnosis Date Noted  . Left foot drop 07/14/2019  . GERD (gastroesophageal reflux disease) 06/18/2019  . Tinea cruris 07/20/2017  . Hemorrhoid 07/20/2017  . Irritability 06/08/2017  . Fall at home 05/06/2017  . Advance care planning 11/28/2016  . Skin mass 11/28/2016  . Lower urinary tract symptoms (LUTS) 09/05/2016  . Hearing loss 09/05/2016  . Excessive daytime sleepiness 04/16/2016  . Exposure to TB 02/21/2016  . Vitamin D deficiency 02/21/2016  . Anemia 11/10/2015  . Left thigh pain 11/10/2015  . Neck pain 10/12/2015  . Hemiplegia affecting left side in left-dominant patient as late effect of cerebrovascular disease ([place]) 09/03/2015  . Medicare annual wellness visit, subsequent 08/14/2015  . Acute pulmonary embolism ([place]) 07/25/2015  . Chest wall pain 07/15/2015  . History of pulmonary embolism 07/15/2015  . Chronic anticoagulation 05/22/2015  . Gait disturbance 05/15/2015  . DNR (do not resuscitate) 01/19/2015  . Hypotension 01/14/2015  . Paresthesia   . Depression 12/14/2014  . OSA (obstructive sleep apnea) 11/30/2014  . Chronic combined systolic and diastolic congestive heart failure (  [place]) 11/11/2014  . SOB (shortness of breath) 04/17/2014  . Hemiplegia affecting left dominant side ([place]) 02/16/2014  . 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 06/30/2011    [name], PT, DPT 10/21/19    6:02 PM    [facility] [address] [place], [place], [address] Phone: [phone]   Fax:  [phone]  Name: QUANDRE POLINSKI MRN: [id number] Date of Birth: July 04, 1950

## 2019-10-25 ENCOUNTER — Other Ambulatory Visit: Payer: Self-pay

## 2019-10-25 ENCOUNTER — Ambulatory Visit: Payer: Medicare Other | Admitting: Physical Therapy

## 2019-10-25 DIAGNOSIS — R2689 Other abnormalities of gait and mobility: Secondary | ICD-10-CM | POA: Diagnosis not present

## 2019-10-25 DIAGNOSIS — M21372 Foot drop, left foot: Secondary | ICD-10-CM

## 2019-10-25 DIAGNOSIS — R2681 Unsteadiness on feet: Secondary | ICD-10-CM

## 2019-10-25 DIAGNOSIS — R296 Repeated falls: Secondary | ICD-10-CM

## 2019-10-25 DIAGNOSIS — R293 Abnormal posture: Secondary | ICD-10-CM

## 2019-10-25 DIAGNOSIS — I69354 Hemiplegia and hemiparesis following cerebral infarction affecting left non-dominant side: Secondary | ICD-10-CM

## 2019-10-25 DIAGNOSIS — M6281 Muscle weakness (generalized): Secondary | ICD-10-CM

## 2019-10-25 NOTE — Therapy (Signed)
[facility] [address], [place], [address] Phone: [phone]   Fax:  [phone]  Physical Therapy Treatment  Patient Details  Name: Gregory Wiley MRN: [id number] Date of Birth: Jan 26, 1951 Referring Provider (PT): Felecia Shelling, Nanine Means, MD   Encounter Date: 10/25/2019   PT End of Session - 10/25/19 2051    Visit Number 35    Number of Visits 50    Date for PT Re-Evaluation 12/18/19    Authorization Type [employer] Medicare - 10th visit PN    Progress Note Due on Visit 21    PT Start Time 1700    PT Stop Time 1745    PT Time Calculation (min) 45 min    Equipment Utilized During Treatment Gait belt    Activity Tolerance Patient limited by fatigue    Behavior During Therapy WFL for tasks assessed/performed           Past Medical History:  Diagnosis Date  . Adrenal insufficiency ([place])   . Anxiety   . Basal cell carcinoma of skin of left ankle   . Bleeding stomach ulcer 2015  . Coronary artery disease    stent, then CABG 07/20/10  . Daily headache    "for the past month" (11/11/2014)  . Depression   . Diverticulosis   . GERD (gastroesophageal reflux disease)   . Gout   . H/O cardiomyopathy    ischemic, now last echo 07/30/11, EF 38%W  . History of hiatal hernia   . Hyperlipidemia   . Hypertension   . Internal hemorrhoids   . Left hemiplegia ([place])   . NSTEMI (non-ST elevated myocardial infarction) ([place])    NSTEMI- last cath 06/2011-Stent to LCX-DES, last nuc 07/30/11 low risk  . Obesity (BMI 30-39.9)   . OSA on CPAP    since July 2013- uses CPAP sometimes , states he has lost 147 lbs. since stroke & doesn't use the CPAP as much as he use to.   . Plantar fasciitis of left foot   . Pneumonia    hosp. 12/2014  . Pulmonary embolism ([place]) 03/2014  . Stroke ([place]) 01/2014   "no control of LUE, can slightly LLE; memory problems since" (11/11/2014)  . Tubular adenoma of colon     Past Surgical History:  Procedure  Laterality Date  . BASAL CELL CARCINOMA EXCISION Left 2015   ankle  . CARDIAC SURGERY    . CATARACT EXTRACTION Bilateral   . COLONOSCOPY N/A 02/10/2013   Procedure: COLONOSCOPY;  Surgeon: Ladene Artist, MD;  Location: [facility];  Service: Endoscopy;  Laterality: N/A;  . CORONARY ANGIOPLASTY WITH STENT PLACEMENT  07/01/2011   DES-Resolute to native LCX  . CORONARY ANGIOPLASTY WITH STENT PLACEMENT  12/01/2007   COMPLEX 5 LESION PCI INCLUDING CUTTING BALLOON AND 4 CYPHER DESs  . CORONARY ARTERY BYPASS GRAFT  07/20/2010    LIMA-LAD; VG-ACUTE MARG of RCA; Seq VG-distal RCA & then pda  . EP IMPLANTABLE DEVICE N/A 02/14/2016   Procedure: Loop Recorder Removal;  Surgeon: Thompson Grayer, MD;  Location: [facility];  Service: Cardiovascular;  Laterality: N/A;  . ESOPHAGOGASTRODUODENOSCOPY  01/2014   gastric ulcer, erosive gastroduodenitis, H Pylori negative.   . ESOPHAGOGASTRODUODENOSCOPY (EGD) WITH PROPOFOL N/A 11/14/2014   Procedure: ESOPHAGOGASTRODUODENOSCOPY (EGD) WITH PROPOFOL;  Surgeon: Milus Banister, MD;  Location: [facility];  Service: Endoscopy;  Laterality: N/A;  . HAND SURGERY Right    to take glass out  . HERNIA REPAIR    .  INTRAMEDULLARY (IM) NAIL INTERTROCHANTERIC Right 02/07/2015   Procedure: INTRAMEDULLARY (IM) NAIL INTERTROCHANTRIC RIGHT HIP;  Surgeon: Renette Butters, MD;  Location: [facility];  Service: Orthopedics;  Laterality: Right;  . KNEE ARTHROSCOPY Right   . LEFT HEART CATHETERIZATION WITH CORONARY/GRAFT ANGIOGRAM  07/01/2011   Procedure: LEFT HEART CATHETERIZATION WITH Beatrix Fetters;  Surgeon: Sanda Klein, MD;  Location: [facility] CATH LAB;  Service: Cardiovascular;;  . LOOP RECORDER IMPLANT  02/15/2014   MDT LINQ implanted by Dr Rayann Heman for cryptogenic stroke  . PERCUTANEOUS CORONARY STENT INTERVENTION (PCI-S) Right 07/01/2011   Procedure: PERCUTANEOUS CORONARY STENT INTERVENTION (PCI-S);  Surgeon: Sanda Klein, MD;  Location: [facility] CATH LAB;  Service:  Cardiovascular;  Laterality: Right;  . PICC LINE PLACE PERIPHERAL ([place] HX)  06/2015  . RETINAL DETACHMENT SURGERY Right   . TEE WITHOUT CARDIOVERSION N/A 02/15/2014   Procedure: TRANSESOPHAGEAL ECHOCARDIOGRAM (TEE);  Surgeon: Josue Hector, MD;  Location: [facility] ENDOSCOPY;  Service: Cardiovascular;  Laterality: N/A;  . UMBILICAL HERNIA REPAIR  2006    There were no vitals filed for this visit.   Subjective Assessment - 10/25/19 1706    Subjective Was able to find shoes at [employer].  6EE size, still challenging to get on.  Only took a few steps with them at home.  Daughter from [place] here to visit as a suprise.    Patient is accompained by: Family member   spouse   Pertinent History DNR; anxiety & depression, CAD with CABG, diverticulosis, gout, HLD, HTN, left hemiplegia, NSTEMI (2013), morbid obesity, PE (2016), CVA (2015), tubular adenoma of colon, OSA w/CPAP, right hip fx w/ORIF (2016), iron deficiency anemia    Patient Stated Goals to obtain a Bioness for home use; to return to PLOF of ambulating household distances and short community distances with quad cane    Currently in Pain? No/denies                             [facility] Adult PT Treatment/Exercise - 10/25/19 2028      Transfers   Transfers Sit to Stand;Stand to Sit    Sit to Stand 3: Mod assist;4: Min assist    Sit to Stand Details (indicate cue type and reason) Pt initially required Mod A to maintain forward weight shift; pt progressed to only needing assistance to maintain head and trunk in midline when shifting forwards but as pt fatigued he returned to requiring mod A     Stand to Sit 4: Min assist;3: Mod assist    Stand to Sit Details Min A initially but as pt fatigued he required increased assistance to pivot fully and control descent      Ambulation/Gait   Ambulation/Gait Yes    Ambulation/Gait Assistance 4: Min assist    Ambulation/Gait Assistance Details Performed gait with new shoes and AFO;  first set pt did not have toe cover on L shoe but was able to advance LLE without toe drag until LLE fatigued.  For remainder of sets utilized shoe cover over L toe to simulate leather toe cap and as pt fatigued pt able to advance LLE full step length without dragging/catching on floor.  Pt ambulated 3 sets (1/4 distance) around gym track with therapist providing facilitation to maintain head in midline, upright trunk, weight shift over LLE during stance phase and L hip anterior rotation during L swing phase.  Intermittent assistance to abduct LLE    Ambulation Distance (Feet) 10 Feet  x 3 reps   Assistive device Small based quad cane    Gait Pattern Step-through pattern;Decreased stride length;Decreased hip/knee flexion - left;Decreased dorsiflexion - left;Poor foot clearance - left;Step-to pattern;Decreased stance time - left;Decreased weight shift to left;Decreased step length - left;Lateral trunk lean to right;Trunk rotated posteriorly on left;Narrow base of support    Ambulation Surface Level;Indoor      Therapeutic Activites    Therapeutic Activities Other Therapeutic Activities    Other Therapeutic Activities Daughter has not seen patient in 1.5 years and reports he is doing less than when she saw him previously and wishes he would do more activity at home.  Discussed patient's recent decline/deconditioning and barriers to pt performing more activity at home (wife's limited ability to physical guard and help).  Discussed that therapy's goal with new AFO and shoes is to return pt to previous level of function.  Daughter states her mother has a difficult time donning shoe with AFO; will address next session.      Knee/Hip Exercises: Standing   Hip Flexion Stengthening;Left;Knee straight    Hip Flexion Limitations In // bars: Attempted to train and focus on initiation of L hip and knee flexion during swing phase first with forward stepping with balance beam between feet to decrease  adduction/scissoring but pt unable to perform without catching foot on beam.  Attempted to add resistance for increased mm activation: Changed to pushing BOSU forwards with toe, able to perform with RLE but lacked strength to push with LLE.      Hip Abduction Stengthening;Left;4 sets;5 reps;Knee straight    Abduction Limitations in // bars focused on hip ABD with side stepping to L x 5 reps x 4 sets with therapist providing verbal cues and tactile cues to rotate L pelvis anteriorly and elevate pelvis through R hip closed chain ABD.  Required multiple seated rest breaks due to fatigue                    PT Short Term Goals - 10/19/19 1717      PT SHORT TERM GOAL #1   Title Pt will participate in Bioness consult with clinical specialist to determine if pt will be appropriate for home Bioness    Baseline will be on 8/25    Time 4    Period Weeks    Status New    Target Date 11/18/19      PT SHORT TERM GOAL #2   Title Pt will decrease TUG time with LBQC and AFO to </= 80 seconds with min A    Baseline 93 seconds with quad cane and AFO    Time 4    Period Weeks    Status Revised    Target Date 11/18/19      PT SHORT TERM GOAL #3   Title Pt will be able to transfer w/c <> mat to L and R with quad cane, AFO, new shoe and Min Guard    Time 4    Period Weeks    Status New    Target Date 11/18/19      PT SHORT TERM GOAL #4   Title Pt will be able to perform gait velocity assessment along full distance with cane, AFO and new shoe    Baseline Not assessed    Time 4    Period Weeks    Status New    Target Date 11/18/19             PT Long  Term Goals - 10/19/19 1721      PT LONG TERM GOAL #1   Title Wife will demonstrate ability to don/doff AFO with new shoe and inspect skin for pressure areas or blisters    Time 8    Period Weeks    Status New    Target Date 12/18/19      PT LONG TERM GOAL #2   Title Pt will improve TUG with cane and AFO to </= 70 seconds    Time 8     Period Weeks    Status Revised    Target Date 12/18/19      PT LONG TERM GOAL #3   Title Patient will improve gait velocity over full distance with quad cane and AFO to >0.8 ft/sec    Time 8    Period Weeks    Status Revised    Target Date 12/18/19      PT LONG TERM GOAL #4   Title Bioness goal if indicated after consultation      PT LONG TERM GOAL #5   Title Pt will demonstrate ability to transfer w/c <> mat/chair with quad cane, AFO to L and R consistently with supervision    Time 8    Period Weeks    Status Revised    Target Date 12/18/19                 Plan - 10/25/19 2051    Clinical Impression Statement Pt now has shoes to fit AFO; session focused on increasing gait distance and endurance and training with new shoes/AFO and standing LE strengthening exercises.  Despite fatigue pt was able to participate in full session when given multiple seated rest breaks.  Even when LLE demonstrated significant fatigue no L knee buckling noted.  Will continue to address and progress towards LTG.    Personal Factors and Comorbidities Age;Fitness;Past/Current Experience;Comorbidity 3+;Time since onset of injury/illness/exacerbation    Comorbidities anixety & depression, CAD, diverticulosis, gout, HLD, HTN, left hemiplegia, NSTEMI (2013), morbid obesity, PE (2016), CVA (2015), tubular adenoma of colon, OSA w/CPAP, right hip fx w/ORIF (2016), iron deficiency anemia    Examination-Activity Limitations Squat;Stairs;Stand;Dressing;Transfers;Bed Mobility;Locomotion Level    Examination-Participation Restrictions Church;Community Activity    Stability/Clinical Decision Making Evolving/Moderate complexity    Rehab Potential Good    PT Frequency 2x / week    PT Duration 8 weeks    PT Treatment/Interventions ADLs/Self Care Home Management;Electrical Stimulation;DME Instruction;Balance training;Therapeutic exercise;Orthotic Fit/Training;Gait training;Neuromuscular re-education;Stair  training;Functional mobility training;Patient/family education;Therapeutic activities;Manual techniques;Compression bandaging;Passive range of motion    PT Next Visit Plan Patient now has shoes!!  Continue standing tolerance and endurance, weight shifting, gait training with new AFO. R sidelying for L hip ROM and strengthening.  Bioness in training mode, during Mat exercises in sitting/supine if he can tolerate ankle, sit > stand training with Bioness.  No walking with Bioness right now, only AFO - Bioness consult on 8/25.  Power w/c trial and safety assessment?    Consulted and Agree with Plan of Care Patient;Family member/caregiver    Family Member Consulted daughter           Patient will benefit from skilled therapeutic intervention in order to improve the following deficits and impairments:  Abnormal gait, Decreased range of motion, Difficulty walking, Impaired tone, Obesity, Impaired UE functional use, Decreased endurance, Decreased activity tolerance, Decreased skin integrity, Decreased balance, Decreased knowledge of use of DME, Impaired flexibility, Postural dysfunction, Impaired sensation, Increased edema, Decreased strength, Decreased mobility  Visit Diagnosis: Other abnormalities of gait and mobility  Muscle weakness (generalized)  Unsteadiness on feet  Repeated falls  Foot drop, left  Abnormal posture  Spastic hemiplegia of left nondominant side as late effect of cerebral infarction [facility])     Problem List Patient Active Problem List   Diagnosis Date Noted  . Left foot drop 07/14/2019  . GERD (gastroesophageal reflux disease) 06/18/2019  . Tinea cruris 07/20/2017  . Hemorrhoid 07/20/2017  . Irritability 06/08/2017  . Fall at home 05/06/2017  . Advance care planning 11/28/2016  . Skin mass 11/28/2016  . Lower urinary tract symptoms (LUTS) 09/05/2016  . Hearing loss 09/05/2016  . Excessive daytime sleepiness 04/16/2016  . Exposure to TB 02/21/2016  . Vitamin D  deficiency 02/21/2016  . Anemia 11/10/2015  . Left thigh pain 11/10/2015  . Neck pain 10/12/2015  . Hemiplegia affecting left side in left-dominant patient as late effect of cerebrovascular disease ([place]) 09/03/2015  . Medicare annual wellness visit, subsequent 08/14/2015  . Acute pulmonary embolism ([place]) 07/25/2015  . Chest wall pain 07/15/2015  . History of pulmonary embolism 07/15/2015  . Chronic anticoagulation 05/22/2015  . Gait disturbance 05/15/2015  . DNR (do not resuscitate) 01/19/2015  . Hypotension 01/14/2015  . Paresthesia   . Depression 12/14/2014  . OSA (obstructive sleep apnea) 11/30/2014  . Chronic combined systolic and diastolic congestive heart failure ([place]) 11/11/2014  . SOB (shortness of breath) 04/17/2014  . Hemiplegia affecting left dominant side ([place]) 02/16/2014  . 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 06/30/2011    [name], PT, DPT 10/25/19    8:56 PM    [facility] [address] [place], [place], [address] Phone: [phone]   Fax:  [phone]  Name: TAIDEN RAYBOURN MRN: [id number] Date of Birth: 07-09-1950

## 2019-10-27 ENCOUNTER — Other Ambulatory Visit: Payer: Self-pay

## 2019-10-27 ENCOUNTER — Encounter: Payer: Medicare Other | Admitting: Cardiology

## 2019-10-27 ENCOUNTER — Encounter: Payer: Self-pay | Admitting: Cardiology

## 2019-10-27 DIAGNOSIS — Z006 Encounter for examination for normal comparison and control in clinical research program: Secondary | ICD-10-CM

## 2019-10-27 NOTE — Progress Notes (Signed)
Research visit.

## 2019-10-28 ENCOUNTER — Ambulatory Visit: Payer: Medicare Other | Admitting: Physical Therapy

## 2019-10-28 DIAGNOSIS — M6281 Muscle weakness (generalized): Secondary | ICD-10-CM

## 2019-10-28 DIAGNOSIS — R293 Abnormal posture: Secondary | ICD-10-CM

## 2019-10-28 DIAGNOSIS — R2681 Unsteadiness on feet: Secondary | ICD-10-CM

## 2019-10-28 DIAGNOSIS — M21372 Foot drop, left foot: Secondary | ICD-10-CM

## 2019-10-28 DIAGNOSIS — R2689 Other abnormalities of gait and mobility: Secondary | ICD-10-CM | POA: Diagnosis not present

## 2019-10-28 DIAGNOSIS — R296 Repeated falls: Secondary | ICD-10-CM

## 2019-10-28 DIAGNOSIS — I69354 Hemiplegia and hemiparesis following cerebral infarction affecting left non-dominant side: Secondary | ICD-10-CM

## 2019-10-28 NOTE — Therapy (Signed)
[facility] [address] [place], [place], [address] Phone: [phone]   Fax:  [phone]  Physical Therapy Treatment  Patient Details  Name: Gregory Wiley MRN: [id number] Date of Birth: 01/07/1951 Referring Provider (PT): Felecia Shelling, Nanine Means, MD   Encounter Date: 10/28/2019   PT End of Session - 10/28/19 1917    Visit Number 36    Number of Visits 50    Date for PT Re-Evaluation 12/18/19    Authorization Type [employer] Medicare - 10th visit PN    Progress Note Due on Visit 27    PT Start Time 1420   arrived late   PT Stop Time 1450    PT Time Calculation (min) 30 min    Equipment Utilized During Treatment Gait belt    Activity Tolerance Patient tolerated treatment well    Behavior During Therapy WFL for tasks assessed/performed           Past Medical History:  Diagnosis Date  . Adrenal insufficiency ([place])   . Anxiety   . Basal cell carcinoma of skin of left ankle   . Bleeding stomach ulcer 2015  . Coronary artery disease    stent, then CABG 07/20/10  . Daily headache    "for the past month" (11/11/2014)  . Depression   . Diverticulosis   . GERD (gastroesophageal reflux disease)   . Gout   . H/O cardiomyopathy    ischemic, now last echo 07/30/11, EF 38%W  . History of hiatal hernia   . Hyperlipidemia   . Hypertension   . Internal hemorrhoids   . Left hemiplegia ([place])   . NSTEMI (non-ST elevated myocardial infarction) ([place])    NSTEMI- last cath 06/2011-Stent to LCX-DES, last nuc 07/30/11 low risk  . Obesity (BMI 30-39.9)   . OSA on CPAP    since July 2013- uses CPAP sometimes , states he has lost 147 lbs. since stroke & doesn't use the CPAP as much as he use to.   . Plantar fasciitis of left foot   . Pneumonia    hosp. 12/2014  . Pulmonary embolism ([place]) 03/2014  . Stroke ([place]) 01/2014   "no control of LUE, can slightly LLE; memory problems since" (11/11/2014)  . Tubular adenoma of colon     Past Surgical  History:  Procedure Laterality Date  . BASAL CELL CARCINOMA EXCISION Left 2015   ankle  . CARDIAC SURGERY    . CATARACT EXTRACTION Bilateral   . COLONOSCOPY N/A 02/10/2013   Procedure: COLONOSCOPY;  Surgeon: Ladene Artist, MD;  Location: [facility]OS[facility];  Service: Endoscopy;  Laterality: N/A;  . CORONARY ANGIOPLASTY WITH STENT PLACEMENT  07/01/2011   DES-Resolute to native LCX  . CORONARY ANGIOPLASTY WITH STENT PLACEMENT  12/01/2007   COMPLEX 5 LESION PCI INCLUDING CUTTING BALLOON AND 4 CYPHER DESs  . CORONARY ARTERY BYPASS GRAFT  07/20/2010    LIMA-LAD; VG-ACUTE MARG of RCA; Seq VG-distal RCA & then pda  . EP IMPLANTABLE DEVICE N/A 02/14/2016   Procedure: Loop Recorder Removal;  Surgeon: Thompson Grayer, MD;  Location: [facility] CV LAB;  Service: Cardiovascular;  Laterality: N/A;  . ESOPHAGOGASTRODUODENOSCOPY  01/2014   gastric ulcer, erosive gastroduodenitis, H Pylori negative.   . ESOPHAGOGASTRODUODENOSCOPY (EGD) WITH PROPOFOL N/A 11/14/2014   Procedure: ESOPHAGOGASTRODUODENOSCOPY (EGD) WITH PROPOFOL;  Surgeon: Milus Banister, MD;  Location: [facility];  Service: Endoscopy;  Laterality: N/A;  . HAND SURGERY Right    to take glass out  . HERNIA  REPAIR    . INTRAMEDULLARY (IM) NAIL INTERTROCHANTERIC Right 02/07/2015   Procedure: INTRAMEDULLARY (IM) NAIL INTERTROCHANTRIC RIGHT HIP;  Surgeon: Renette Butters, MD;  Location: [facility];  Service: Orthopedics;  Laterality: Right;  . KNEE ARTHROSCOPY Right   . LEFT HEART CATHETERIZATION WITH CORONARY/GRAFT ANGIOGRAM  07/01/2011   Procedure: LEFT HEART CATHETERIZATION WITH Beatrix Fetters;  Surgeon: Sanda Klein, MD;  Location: [facility] CATH LAB;  Service: Cardiovascular;;  . LOOP RECORDER IMPLANT  02/15/2014   MDT LINQ implanted by Dr Rayann Heman for cryptogenic stroke  . PERCUTANEOUS CORONARY STENT INTERVENTION (PCI-S) Right 07/01/2011   Procedure: PERCUTANEOUS CORONARY STENT INTERVENTION (PCI-S);  Surgeon: Sanda Klein, MD;  Location: [facility] CATH  LAB;  Service: Cardiovascular;  Laterality: Right;  . PICC LINE PLACE PERIPHERAL ([place] HX)  06/2015  . RETINAL DETACHMENT SURGERY Right   . TEE WITHOUT CARDIOVERSION N/A 02/15/2014   Procedure: TRANSESOPHAGEAL ECHOCARDIOGRAM (TEE);  Surgeon: Josue Hector, MD;  Location: [facility] ENDOSCOPY;  Service: Cardiovascular;  Laterality: N/A;  . UMBILICAL HERNIA REPAIR  2006    There were no vitals filed for this visit.   Subjective Assessment - 10/28/19 1903    Subjective Wife brought shoe horn to practice donning shoe with AFO.  Pt was tired and sore after last session.  Had EKG yesterday, no abnormal findings.    Patient is accompained by: Family member   spouse   Pertinent History DNR; anxiety & depression, CAD with CABG, diverticulosis, gout, HLD, HTN, left hemiplegia, NSTEMI (2013), morbid obesity, PE (2016), CVA (2015), tubular adenoma of colon, OSA w/CPAP, right hip fx w/ORIF (2016), iron deficiency anemia    Patient Stated Goals to obtain a Bioness for home use; to return to PLOF of ambulating household distances and short community distances with quad cane    Currently in Pain? No/denies              [facility] PT Assessment - 10/28/19 1438      Standardized Balance Assessment   10 Meter Walk 122 seconds with cane, AFO/shoe with one sitting rest break where clock stopped.  .26 ft/sec.  Required therapist's assistance to advance LLE fully as pt fatigued due to increased toe drag and decreased hip flexion.                         [facility] PT Treatment/Exercise - 10/28/19 1904      Ambulation/Gait   Ambulation/Gait Yes    Ambulation/Gait Assistance 3: Mod assist    Ambulation/Gait Assistance Details Pt performed gait almost half way around gym track today with less rest breaks; able to ambulate further with each repetition.  Therapist provided verbal cues for upright posture and provided assistance for full LLE step length and clearance; also assisted with L foot placement in  slight ABD for improved stability during L stance phase.      Ambulation Distance (Feet) 21 Feet    Assistive device Small based quad cane    Gait Pattern Step-through pattern;Decreased stride length;Decreased hip/knee flexion - left;Decreased dorsiflexion - left;Poor foot clearance - left;Decreased stance time - left;Decreased weight shift to left;Decreased step length - left;Lateral trunk lean to right;Trunk rotated posteriorly on left    Ambulation Surface Level;Indoor    Gait velocity Performed modified gait velocity test today with AFO and shoe, one seated rest break half way with clock stopped and therapist assisting with LLE advancement.      Therapeutic Activites    Therapeutic Activities Other  Therapeutic Activities    Other Therapeutic Activities Wife has been donning patient's new shoes with pt seated in recliner with LE elevated.  Wife places foot in AFO and then both in shoe and presses against her knee with shoe horn to push foot all the way into the shoe.  Pt not able to assist at all with LE fully extended.  Demonstrated alternate method to pt and wife and repeated sequence 3 times with wife observing.  Demonstrated how to start shoe and set shoe horn with LE extended on recliner and then to lower recliner and foot onto foot stool.  Cued to keep L hip internally rotated to neutral and have pt push down as she assists with shoe horn.  Therapist performed 3 times.  Wife to try at home.                  PT Education - 10/28/19 1916    Education Details alternate method for donning shoes with AFO on foot    Person(s) Educated Patient;Spouse    Methods Explanation;Demonstration    Comprehension Verbalized understanding            PT Short Term Goals - 10/19/19 1717      PT SHORT TERM GOAL #1   Title Pt will participate in Bioness consult with clinical specialist to determine if pt will be appropriate for home Bioness    Baseline will be on 8/25    Time 4    Period  Weeks    Status New    Target Date 11/18/19      PT SHORT TERM GOAL #2   Title Pt will decrease TUG time with LBQC and AFO to </= 80 seconds with min A    Baseline 93 seconds with quad cane and AFO    Time 4    Period Weeks    Status Revised    Target Date 11/18/19      PT SHORT TERM GOAL #3   Title Pt will be able to transfer w/c <> mat to L and R with quad cane, AFO, new shoe and Min Guard    Time 4    Period Weeks    Status New    Target Date 11/18/19      PT SHORT TERM GOAL #4   Title Pt will be able to perform gait velocity assessment along full distance with cane, AFO and new shoe    Baseline Not assessed    Time 4    Period Weeks    Status New    Target Date 11/18/19             PT Long Term Goals - 10/19/19 1721      PT LONG TERM GOAL #1   Title Wife will demonstrate ability to don/doff AFO with new shoe and inspect skin for pressure areas or blisters    Time 8    Period Weeks    Status New    Target Date 12/18/19      PT LONG TERM GOAL #2   Title Pt will improve TUG with cane and AFO to </= 70 seconds    Time 8    Period Weeks    Status Revised    Target Date 12/18/19      PT LONG TERM GOAL #3   Title Patient will improve gait velocity over full distance with quad cane and AFO to >0.8 ft/sec    Time 8    Period Weeks  Status Revised    Target Date 12/18/19      PT LONG TERM GOAL #4   Title Bioness goal if indicated after consultation      PT LONG TERM GOAL #5   Title Pt will demonstrate ability to transfer w/c <> mat/chair with quad cane, AFO to L and R consistently with supervision    Time 8    Period Weeks    Status Revised    Target Date 12/18/19                 Plan - 10/28/19 1917    Clinical Impression Statement Continued training with new shoes and AFO with wife and patient reviewing alternate method for donning to improve energy conservation for wife and prevent injury to her knee.  Performed gait velocity assessment but  modified with seated rest break and therapist assising with LLE.  When pt did not experience toe drag or LLE adduction he demonstrated improved stability, improved endurance and ability to ambulate further distances.  Will continue to address and progress towards LTG.    Personal Factors and Comorbidities Age;Fitness;Past/Current Experience;Comorbidity 3+;Time since onset of injury/illness/exacerbation    Comorbidities anixety & depression, CAD, diverticulosis, gout, HLD, HTN, left hemiplegia, NSTEMI (2013), morbid obesity, PE (2016), CVA (2015), tubular adenoma of colon, OSA w/CPAP, right hip fx w/ORIF (2016), iron deficiency anemia    Examination-Activity Limitations Squat;Stairs;Stand;Dressing;Transfers;Bed Mobility;Locomotion Level    Examination-Participation Restrictions Church;Community Activity    Stability/Clinical Decision Making Evolving/Moderate complexity    Rehab Potential Good    PT Frequency 2x / week    PT Duration 8 weeks    PT Treatment/Interventions ADLs/Self Care Home Management;Electrical Stimulation;DME Instruction;Balance training;Therapeutic exercise;Orthotic Fit/Training;Gait training;Neuromuscular re-education;Stair training;Functional mobility training;Patient/family education;Therapeutic activities;Manual techniques;Compression bandaging;Passive range of motion    PT Next Visit Plan Continue standing tolerance and endurance, weight shifting, gait training with new AFO. R sidelying for L hip ROM and strengthening.  Bioness in training mode, during Mat exercises in sitting/supine if he can tolerate ankle, sit > stand training with Bioness.  No walking with Bioness right now, only AFO - Bioness consult on 8/25.  Power w/c trial and safety assessment?    Consulted and Agree with Plan of Care Patient;Family member/caregiver    Family Member Consulted daughter           Patient will benefit from skilled therapeutic intervention in order to improve the following deficits and  impairments:  Abnormal gait, Decreased range of motion, Difficulty walking, Impaired tone, Obesity, Impaired UE functional use, Decreased endurance, Decreased activity tolerance, Decreased skin integrity, Decreased balance, Decreased knowledge of use of DME, Impaired flexibility, Postural dysfunction, Impaired sensation, Increased edema, Decreased strength, Decreased mobility  Visit Diagnosis: Other abnormalities of gait and mobility  Muscle weakness (generalized)  Unsteadiness on feet  Repeated falls  Foot drop, left  Abnormal posture  Spastic hemiplegia of left nondominant side as late effect of cerebral infarction [facility])     Problem List Patient Active Problem List   Diagnosis Date Noted  . Left foot drop 07/14/2019  . GERD (gastroesophageal reflux disease) 06/18/2019  . Tinea cruris 07/20/2017  . Hemorrhoid 07/20/2017  . Irritability 06/08/2017  . Fall at home 05/06/2017  . Research exam 11/28/2016  . Skin mass 11/28/2016  . Lower urinary tract symptoms (LUTS) 09/05/2016  . Hearing loss 09/05/2016  . Excessive daytime sleepiness 04/16/2016  . Exposure to TB 02/21/2016  . Vitamin D deficiency 02/21/2016  . Anemia 11/10/2015  . Left thigh  pain 11/10/2015  . Neck pain 10/12/2015  . Hemiplegia affecting left side in left-dominant patient as late effect of cerebrovascular disease ([place]) 09/03/2015  . Medicare annual wellness visit, subsequent 08/14/2015  . Acute pulmonary embolism ([place]) 07/25/2015  . Chest wall pain 07/15/2015  . History of pulmonary embolism 07/15/2015  . Chronic anticoagulation 05/22/2015  . Gait disturbance 05/15/2015  . DNR (do not resuscitate) 01/19/2015  . Hypotension 01/14/2015  . Paresthesia   . Depression 12/14/2014  . OSA (obstructive sleep apnea) 11/30/2014  . Chronic combined systolic and diastolic congestive heart failure ([place]) 11/11/2014  . SOB (shortness of breath) 04/17/2014  . Hemiplegia affecting left dominant side ([place]) 02/16/2014    . 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 06/30/2011    [name], PT, DPT 10/28/19    7:22 PM    [facility] [address] [place], [place], [address] Phone: [phone]   Fax:  [phone]  Name: Gregory Wiley MRN: [id number] Date of Birth: 1950/07/04

## 2019-11-01 ENCOUNTER — Other Ambulatory Visit: Payer: Self-pay

## 2019-11-01 ENCOUNTER — Encounter: Payer: Self-pay | Admitting: Physical Therapy

## 2019-11-01 ENCOUNTER — Ambulatory Visit: Payer: Medicare Other | Admitting: Physical Therapy

## 2019-11-01 DIAGNOSIS — R2681 Unsteadiness on feet: Secondary | ICD-10-CM

## 2019-11-01 DIAGNOSIS — R296 Repeated falls: Secondary | ICD-10-CM

## 2019-11-01 DIAGNOSIS — R2689 Other abnormalities of gait and mobility: Secondary | ICD-10-CM

## 2019-11-01 DIAGNOSIS — I69354 Hemiplegia and hemiparesis following cerebral infarction affecting left non-dominant side: Secondary | ICD-10-CM

## 2019-11-01 DIAGNOSIS — M21372 Foot drop, left foot: Secondary | ICD-10-CM

## 2019-11-01 DIAGNOSIS — M6281 Muscle weakness (generalized): Secondary | ICD-10-CM

## 2019-11-01 DIAGNOSIS — R293 Abnormal posture: Secondary | ICD-10-CM

## 2019-11-01 NOTE — Therapy (Signed)
[facility] [address] [place], [place], [address] Phone: [phone]   Fax:  [phone]  Physical Therapy Treatment  Patient Details  Name: Gregory Wiley MRN: [id number] Date of Birth: Jul 13, 1950 Referring Provider (PT): Felecia Shelling, Nanine Means, MD   Encounter Date: 11/01/2019   PT End of Session - 11/01/19 1736    Visit Number 37    Number of Visits 50    Date for PT Re-Evaluation 12/18/19    Authorization Type [employer] Medicare - 10th visit PN    Progress Note Due on Visit 56    PT Start Time 1445    PT Stop Time 1530    PT Time Calculation (min) 45 min    Equipment Utilized During Treatment Gait belt    Activity Tolerance Patient tolerated treatment well    Behavior During Therapy WFL for tasks assessed/performed           Past Medical History:  Diagnosis Date  . Adrenal insufficiency ([place])   . Anxiety   . Basal cell carcinoma of skin of left ankle   . Bleeding stomach ulcer 2015  . Coronary artery disease    stent, then CABG 07/20/10  . Daily headache    "for the past month" (11/11/2014)  . Depression   . Diverticulosis   . GERD (gastroesophageal reflux disease)   . Gout   . H/O cardiomyopathy    ischemic, now last echo 07/30/11, EF 38%W  . History of hiatal hernia   . Hyperlipidemia   . Hypertension   . Internal hemorrhoids   . Left hemiplegia ([place])   . NSTEMI (non-ST elevated myocardial infarction) ([place])    NSTEMI- last cath 06/2011-Stent to LCX-DES, last nuc 07/30/11 low risk  . Obesity (BMI 30-39.9)   . OSA on CPAP    since July 2013- uses CPAP sometimes , states he has lost 147 lbs. since stroke & doesn't use the CPAP as much as he use to.   . Plantar fasciitis of left foot   . Pneumonia    hosp. 12/2014  . Pulmonary embolism ([place]) 03/2014  . Stroke ([place]) 01/2014   "no control of LUE, can slightly LLE; memory problems since" (11/11/2014)  . Tubular adenoma of colon     Past Surgical History:   Procedure Laterality Date  . BASAL CELL CARCINOMA EXCISION Left 2015   ankle  . CARDIAC SURGERY    . CATARACT EXTRACTION Bilateral   . COLONOSCOPY N/A 02/10/2013   Procedure: COLONOSCOPY;  Surgeon: Ladene Artist, MD;  Location: [facility];  Service: Endoscopy;  Laterality: N/A;  . CORONARY ANGIOPLASTY WITH STENT PLACEMENT  07/01/2011   DES-Resolute to native LCX  . CORONARY ANGIOPLASTY WITH STENT PLACEMENT  12/01/2007   COMPLEX 5 LESION PCI INCLUDING CUTTING BALLOON AND 4 CYPHER DESs  . CORONARY ARTERY BYPASS GRAFT  07/20/2010    LIMA-LAD; VG-ACUTE MARG of RCA; Seq VG-distal RCA & then pda  . EP IMPLANTABLE DEVICE N/A 02/14/2016   Procedure: Loop Recorder Removal;  Surgeon: Thompson Grayer, MD;  Location: [facility] CV LAB;  Service: Cardiovascular;  Laterality: N/A;  . ESOPHAGOGASTRODUODENOSCOPY  01/2014   gastric ulcer, erosive gastroduodenitis, H Pylori negative.   . ESOPHAGOGASTRODUODENOSCOPY (EGD) WITH PROPOFOL N/A 11/14/2014   Procedure: ESOPHAGOGASTRODUODENOSCOPY (EGD) WITH PROPOFOL;  Surgeon: Milus Banister, MD;  Location: [facility];  Service: Endoscopy;  Laterality: N/A;  . HAND SURGERY Right    to take glass out  . HERNIA REPAIR    .  INTRAMEDULLARY (IM) NAIL INTERTROCHANTERIC Right 02/07/2015   Procedure: INTRAMEDULLARY (IM) NAIL INTERTROCHANTRIC RIGHT HIP;  Surgeon: Renette Butters, MD;  Location: [facility];  Service: Orthopedics;  Laterality: Right;  . KNEE ARTHROSCOPY Right   . LEFT HEART CATHETERIZATION WITH CORONARY/GRAFT ANGIOGRAM  07/01/2011   Procedure: LEFT HEART CATHETERIZATION WITH Beatrix Fetters;  Surgeon: Sanda Klein, MD;  Location: [facility] CATH LAB;  Service: Cardiovascular;;  . LOOP RECORDER IMPLANT  02/15/2014   MDT LINQ implanted by Dr Rayann Heman for cryptogenic stroke  . PERCUTANEOUS CORONARY STENT INTERVENTION (PCI-S) Right 07/01/2011   Procedure: PERCUTANEOUS CORONARY STENT INTERVENTION (PCI-S);  Surgeon: Sanda Klein, MD;  Location: [facility] CATH LAB;   Service: Cardiovascular;  Laterality: Right;  . PICC LINE PLACE PERIPHERAL ([place] HX)  06/2015  . RETINAL DETACHMENT SURGERY Right   . TEE WITHOUT CARDIOVERSION N/A 02/15/2014   Procedure: TRANSESOPHAGEAL ECHOCARDIOGRAM (TEE);  Surgeon: Josue Hector, MD;  Location: [facility] ENDOSCOPY;  Service: Cardiovascular;  Laterality: N/A;  . UMBILICAL HERNIA REPAIR  2006    There were no vitals filed for this visit.   Subjective Assessment - 11/01/19 1449    Subjective Did not try alternate method for donning shoe over AFO, wife reports she needs to be in front and pt sits crooked in recliner.  Reports the L knee was more swollen this weekend and he had a line around his lower leg even when he hadn't worn the compression stocking or AFO.    Patient is accompained by: Family member   spouse   Pertinent History DNR; anxiety & depression, CAD with CABG, diverticulosis, gout, HLD, HTN, left hemiplegia, NSTEMI (2013), morbid obesity, PE (2016), CVA (2015), tubular adenoma of colon, OSA w/CPAP, right hip fx w/ORIF (2016), iron deficiency anemia    Patient Stated Goals to obtain a Bioness for home use; to return to PLOF of ambulating household distances and short community distances with quad cane    Currently in Pain? No/denies                             [facility] Adult PT Treatment/Exercise - 11/01/19 1729      Transfers   Transfers Sit to Stand;Stand to Sit    Sit to Stand 4: Min assist    Sit to Stand Details (indicate cue type and reason) assistance to shift weight forwards over BOS when standing    Stand to Sit 4: Min assist    Stand to Sit Details cues to pivot fully to avoid sitting with L hip forwards in chair      Ambulation/Gait   Ambulation/Gait Yes    Ambulation/Gait Assistance 4: Min assist    Ambulation/Gait Assistance Details simulated walking at home with cane in RUE and wall to L side and therapist behind pt providing safety and facilitation through L hip to elevate and rotate  L hip forwards during L swing phase.  Pt performed 15' to w/c and turned around to ambulate 15' back to starting w/c with wall on L.  Took one seated rest break in between.  Will have wife return demonstrate next session    Ambulation Distance (Feet) 15 Feet   x 2   Assistive device Small based quad cane    Gait Pattern Step-through pattern;Decreased stride length;Decreased hip/knee flexion - left;Decreased dorsiflexion - left;Poor foot clearance - left;Decreased stance time - left;Decreased weight shift to left;Decreased step length - left;Lateral trunk lean to right;Trunk rotated posteriorly on left;Step-to pattern  Ambulation Surface Level;Indoor      Therapeutic Activites    Therapeutic Activities Other Therapeutic Activities    Other Therapeutic Activities Reviewed technique and sequence for donning shoe over AFO/foot with shoe horn if sitting in front of patient.  Advised wife to have pt reposition hips prior to performing so he isn't as externally rotated in L hip.  Removed shoe/AFO and sock to observe edema wife noted.  Pt does have a line/sock imprint in lower leg that is below where his compression sock sits - wife reports this line was there this weekend even when pt was not wearing socks.  Edema was noted ABOVE this line around knee joint line and slightly below knee.  No pain at palpation.  Advised pt to contact PCP for assessment since pt recently had a fall on his knees.  Also discussed need for pt and wife to practice ambulation at home for progress and discussed ways to perform safely at home.  Will trial walking down hallway with wall to L, starting from one w/c and walking to next w/c and then turning around and repeating next to opposite wall.                    PT Education - 11/01/19 1736    Education Details see TA    Person(s) Educated Patient;Spouse    Methods Explanation;Demonstration    Comprehension Verbal cues required;Need further instruction;Tactile cues  required            PT Short Term Goals - 10/19/19 1717      PT SHORT TERM GOAL #1   Title Pt will participate in Bioness consult with clinical specialist to determine if pt will be appropriate for home Bioness    Baseline will be on 8/25    Time 4    Period Weeks    Status New    Target Date 11/18/19      PT SHORT TERM GOAL #2   Title Pt will decrease TUG time with LBQC and AFO to </= 80 seconds with min A    Baseline 93 seconds with quad cane and AFO    Time 4    Period Weeks    Status Revised    Target Date 11/18/19      PT SHORT TERM GOAL #3   Title Pt will be able to transfer w/c <> mat to L and R with quad cane, AFO, new shoe and Min Guard    Time 4    Period Weeks    Status New    Target Date 11/18/19      PT SHORT TERM GOAL #4   Title Pt will be able to perform gait velocity assessment along full distance with cane, AFO and new shoe    Baseline Not assessed    Time 4    Period Weeks    Status New    Target Date 11/18/19             PT Long Term Goals - 10/19/19 1721      PT LONG TERM GOAL #1   Title Wife will demonstrate ability to don/doff AFO with new shoe and inspect skin for pressure areas or blisters    Time 8    Period Weeks    Status New    Target Date 12/18/19      PT LONG TERM GOAL #2   Title Pt will improve TUG with cane and AFO to </= 70 seconds  Time 8    Period Weeks    Status Revised    Target Date 12/18/19      PT LONG TERM GOAL #3   Title Patient will improve gait velocity over full distance with quad cane and AFO to >0.8 ft/sec    Time 8    Period Weeks    Status Revised    Target Date 12/18/19      PT LONG TERM GOAL #4   Title Bioness goal if indicated after consultation      PT LONG TERM GOAL #5   Title Pt will demonstrate ability to transfer w/c <> mat/chair with quad cane, AFO to L and R consistently with supervision    Time 8    Period Weeks    Status Revised    Target Date 12/18/19                  Plan - 11/01/19 1737    Clinical Impression Statement Continued to discuss barriers to wearing AFO and walking at home to carryover what pt is performing in therapy and for further progress towards goal.  Reviewed method for donning shoe over AFO/foot and began to problem solve ways for pt and wife to ambulate more at home safely.  Will perform again next session.    Personal Factors and Comorbidities Age;Fitness;Past/Current Experience;Comorbidity 3+;Time since onset of injury/illness/exacerbation    Comorbidities anixety & depression, CAD, diverticulosis, gout, HLD, HTN, left hemiplegia, NSTEMI (2013), morbid obesity, PE (2016), CVA (2015), tubular adenoma of colon, OSA w/CPAP, right hip fx w/ORIF (2016), iron deficiency anemia    Examination-Activity Limitations Squat;Stairs;Stand;Dressing;Transfers;Bed Mobility;Locomotion Level    Examination-Participation Restrictions Church;Community Activity    Stability/Clinical Decision Making Evolving/Moderate complexity    Rehab Potential Good    PT Frequency 2x / week    PT Duration 8 weeks    PT Treatment/Interventions ADLs/Self Care Home Management;Electrical Stimulation;DME Instruction;Balance training;Therapeutic exercise;Orthotic Fit/Training;Gait training;Neuromuscular re-education;Stair training;Functional mobility training;Patient/family education;Therapeutic activities;Manual techniques;Compression bandaging;Passive range of motion    PT Next Visit Plan Did she don shoe over AFO with him pushing down?  Walking with cane and wall on L with wife assisting - have them start at home.  Continue standing tolerance and endurance, weight shifting, gait training with new AFO. R sidelying for L hip ROM and strengthening.  Bioness in training mode, during Mat exercises in sitting/supine if he can tolerate ankle, sit > stand training with Bioness.  No walking with Bioness right now, only AFO - Bioness consult on 8/25.  Power w/c trial and safety assessment?     Consulted and Agree with Plan of Care Patient;Family member/caregiver    Family Member Consulted wife           Patient will benefit from skilled therapeutic intervention in order to improve the following deficits and impairments:  Abnormal gait, Decreased range of motion, Difficulty walking, Impaired tone, Obesity, Impaired UE functional use, Decreased endurance, Decreased activity tolerance, Decreased skin integrity, Decreased balance, Decreased knowledge of use of DME, Impaired flexibility, Postural dysfunction, Impaired sensation, Increased edema, Decreased strength, Decreased mobility  Visit Diagnosis: Other abnormalities of gait and mobility  Muscle weakness (generalized)  Unsteadiness on feet  Repeated falls  Foot drop, left  Abnormal posture  Spastic hemiplegia of left nondominant side as late effect of cerebral infarction [facility])     Problem List Patient Active Problem List   Diagnosis Date Noted  . Left foot drop 07/14/2019  . GERD (gastroesophageal reflux disease) 06/18/2019  .  Tinea cruris 07/20/2017  . Hemorrhoid 07/20/2017  . Irritability 06/08/2017  . Fall at home 05/06/2017  . Research exam 11/28/2016  . Skin mass 11/28/2016  . Lower urinary tract symptoms (LUTS) 09/05/2016  . Hearing loss 09/05/2016  . Excessive daytime sleepiness 04/16/2016  . Exposure to TB 02/21/2016  . Vitamin D deficiency 02/21/2016  . Anemia 11/10/2015  . Left thigh pain 11/10/2015  . Neck pain 10/12/2015  . Hemiplegia affecting left side in left-dominant patient as late effect of cerebrovascular disease ([place]) 09/03/2015  . Medicare annual wellness visit, subsequent 08/14/2015  . Acute pulmonary embolism ([place]) 07/25/2015  . Chest wall pain 07/15/2015  . History of pulmonary embolism 07/15/2015  . Chronic anticoagulation 05/22/2015  . Gait disturbance 05/15/2015  . DNR (do not resuscitate) 01/19/2015  . Hypotension 01/14/2015  . Paresthesia   . Depression 12/14/2014  . OSA  (obstructive sleep apnea) 11/30/2014  . Chronic combined systolic and diastolic congestive heart failure ([place]) 11/11/2014  . SOB (shortness of breath) 04/17/2014  . Hemiplegia affecting left dominant side ([place]) 02/16/2014  . 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 06/30/2011    [name], PT, DPT 11/01/19    5:40 PM    [facility] [address] [place], [place], [address] Phone: [phone]   Fax:  [phone]  Name: Gregory Wiley MRN: [id number] Date of Birth: 07/19/1950

## 2019-11-03 ENCOUNTER — Other Ambulatory Visit: Payer: Self-pay

## 2019-11-03 ENCOUNTER — Ambulatory Visit: Payer: Medicare Other | Admitting: Physical Therapy

## 2019-11-03 ENCOUNTER — Encounter: Payer: Self-pay | Admitting: Physical Therapy

## 2019-11-03 VITALS — BP 121/73 | HR 84

## 2019-11-03 DIAGNOSIS — R2689 Other abnormalities of gait and mobility: Secondary | ICD-10-CM | POA: Diagnosis not present

## 2019-11-03 DIAGNOSIS — M6281 Muscle weakness (generalized): Secondary | ICD-10-CM

## 2019-11-03 DIAGNOSIS — R296 Repeated falls: Secondary | ICD-10-CM

## 2019-11-03 DIAGNOSIS — R2681 Unsteadiness on feet: Secondary | ICD-10-CM

## 2019-11-03 DIAGNOSIS — M21372 Foot drop, left foot: Secondary | ICD-10-CM

## 2019-11-04 NOTE — Therapy (Signed)
[facility] [address] [place], [place], [address] Phone: [phone]   Fax:  [phone]  Physical Therapy Treatment  Patient Details  Name: Gregory Wiley MRN: [id number] Date of Birth: [birth year] Referring Provider (PT): Felecia Shelling, Nanine Means, MD   Encounter Date: 11/03/2019   PT End of Session - 11/03/19 1407    Visit Number 38    Number of Visits 50    Date for PT Re-Evaluation 12/18/19    Authorization Type [employer] Medicare - 10th visit PN    Progress Note Due on Visit 53    PT Start Time 1405    PT Stop Time 1444    PT Time Calculation (min) 39 min    Equipment Utilized During Treatment Gait belt    Activity Tolerance Patient tolerated treatment well    Behavior During Therapy WFL for tasks assessed/performed           Past Medical History:  Diagnosis Date  . Adrenal insufficiency ([place])   . Anxiety   . Basal cell carcinoma of skin of left ankle   . Bleeding stomach ulcer 2015  . Coronary artery disease    stent, then CABG 07/20/10  . Daily headache    "for the past month" (11/11/2014)  . Depression   . Diverticulosis   . GERD (gastroesophageal reflux disease)   . Gout   . H/O cardiomyopathy    ischemic, now last echo 07/30/11, EF 38%W  . History of hiatal hernia   . Hyperlipidemia   . Hypertension   . Internal hemorrhoids   . Left hemiplegia ([place])   . NSTEMI (non-ST elevated myocardial infarction) ([place])    NSTEMI- last cath 06/2011-Stent to LCX-DES, last nuc 07/30/11 low risk  . Obesity (BMI 30-39.9)   . OSA on CPAP    since July 2013- uses CPAP sometimes , states he has lost 147 lbs. since stroke & doesn't use the CPAP as much as he use to.   . Plantar fasciitis of left foot   . Pneumonia    hosp. 12/2014  . Pulmonary embolism ([place]) 03/2014  . Stroke ([place]) 01/2014   "no control of LUE, can slightly LLE; memory problems since" (11/11/2014)  . Tubular adenoma of colon     Past Surgical History:    Procedure Laterality Date  . BASAL CELL CARCINOMA EXCISION Left 2015   ankle  . CARDIAC SURGERY    . CATARACT EXTRACTION Bilateral   . COLONOSCOPY N/A 02/10/2013   Procedure: COLONOSCOPY;  Surgeon: Ladene Artist, MD;  Location: [facility];  Service: Endoscopy;  Laterality: N/A;  . CORONARY ANGIOPLASTY WITH STENT PLACEMENT  07/01/2011   DES-Resolute to native LCX  . CORONARY ANGIOPLASTY WITH STENT PLACEMENT  12/01/2007   COMPLEX 5 LESION PCI INCLUDING CUTTING BALLOON AND 4 CYPHER DESs  . CORONARY ARTERY BYPASS GRAFT  07/20/2010    LIMA-LAD; VG-ACUTE MARG of RCA; Seq VG-distal RCA & then pda  . EP IMPLANTABLE DEVICE N/A 02/14/2016   Procedure: Loop Recorder Removal;  Surgeon: Thompson Grayer, MD;  Location: [facility];  Service: Cardiovascular;  Laterality: N/A;  . ESOPHAGOGASTRODUODENOSCOPY  01/2014   gastric ulcer, erosive gastroduodenitis, H Pylori negative.   . ESOPHAGOGASTRODUODENOSCOPY (EGD) WITH PROPOFOL N/A 11/14/2014   Procedure: ESOPHAGOGASTRODUODENOSCOPY (EGD) WITH PROPOFOL;  Surgeon: Milus Banister, MD;  Location: [facility];  Service: Endoscopy;  Laterality: N/A;  . HAND SURGERY Right    to take glass out  . HERNIA REPAIR    .  INTRAMEDULLARY (IM) NAIL INTERTROCHANTERIC Right 02/07/2015   Procedure: INTRAMEDULLARY (IM) NAIL INTERTROCHANTRIC RIGHT HIP;  Surgeon: Renette Butters, MD;  Location: [facility];  Service: Orthopedics;  Laterality: Right;  . KNEE ARTHROSCOPY Right   . LEFT HEART CATHETERIZATION WITH CORONARY/GRAFT ANGIOGRAM  07/01/2011   Procedure: LEFT HEART CATHETERIZATION WITH Beatrix Fetters;  Surgeon: Sanda Klein, MD;  Location: [facility] CATH LAB;  Service: Cardiovascular;;  . LOOP RECORDER IMPLANT  02/15/2014   MDT LINQ implanted by Dr Rayann Heman for cryptogenic stroke  . PERCUTANEOUS CORONARY STENT INTERVENTION (PCI-S) Right 07/01/2011   Procedure: PERCUTANEOUS CORONARY STENT INTERVENTION (PCI-S);  Surgeon: Sanda Klein, MD;  Location: [facility] CATH LAB;   Service: Cardiovascular;  Laterality: Right;  . PICC LINE PLACE PERIPHERAL ([place] HX)  06/2015  . RETINAL DETACHMENT SURGERY Right   . TEE WITHOUT CARDIOVERSION N/A 02/15/2014   Procedure: TRANSESOPHAGEAL ECHOCARDIOGRAM (TEE);  Surgeon: Josue Hector, MD;  Location[facility] ENDOSCOPY;  Service: Cardiovascular;  Laterality: N/A;  . UMBILICAL HERNIA REPAIR  2006    Vitals:   11/03/19 1406 11/03/19 1430  BP: 114/65 121/73  Pulse: 75 84     Subjective Assessment - 11/03/19 1406    Subjective Reports "my head is not right". States the "fuzziness" is worse today. Also reports being constipated, has not had a bowel movement for 3 days.    Patient is accompained by: Family member   in lobby/car on phone call   Pertinent History DNR; anxiety & depression, CAD with CABG, diverticulosis, gout, HLD, HTN, left hemiplegia, NSTEMI (2013), morbid obesity, PE (2016), CVA (2015), tubular adenoma of colon, OSA w/CPAP, right hip fx w/ORIF (2016), iron deficiency anemia    Limitations Standing;Walking    Patient Stated Goals to obtain a Bioness for home use; to return to PLOF of ambulating household distances and short community distances with quad cane    Currently in Pain? No/denies    Pain Score 0-No pain                [facility] Adult PT Treatment/Exercise - 11/03/19 1416      Transfers   Transfers Sit to Stand;Stand to Sit    Sit to Stand 4: Min assist;With upper extremity assist;From chair/3-in-1    Sit to Stand Details Verbal cues for sequencing;Verbal cues for safe use of DME/AE;Verbal cues for precautions/safety    Sit to Stand Details (indicate cue type and reason) cues to scoot closer to edge of seat and for increased anterior weight shifting. cues for left foot placement with standing.     Stand to Sit 4: Min assist;With upper extremity assist;To chair/3-in-1    Stand to Sit Details (indicate cue type and reason) Verbal cues for sequencing;Verbal cues for precautions/safety;Verbal cues for safe use  of DME/AE    Stand to Sit Details cues to reach back for controlled sitting.       Ambulation/Gait   Ambulation/Gait Yes    Ambulation/Gait Assistance 4: Min assist    Ambulation/Gait Assistance Details cues on posture, cane placement and cues/assist for left LE placement with swing phase as it tends to adduct/scissor. Pt with complaints of increased fuzziness/vision issues after last gait rep. BP checked and WNL's. Pt reported improved vision as he rested in wheelchair. Switched to seated LE strengthening at this time.      Ambulation Distance (Feet) --   8-10 feet 3 reps   Assistive device Large base quad cane    Gait Pattern Step-through pattern;Decreased stride length;Decreased hip/knee flexion - left;Decreased  dorsiflexion - left;Poor foot clearance - left;Decreased stance time - left;Decreased weight shift to left;Decreased step length - left;Lateral trunk lean to right;Trunk rotated posteriorly on left;Step-to pattern    Ambulation Surface Level;Indoor      Exercises   Exercises Other Exercises    Other Exercises  seated in wheelchair with brace off: heel slides with overpressure for knee flexion/manual resistance for knee extension, then assisted with yellow band DF/resisted PF                    PT Short Term Goals - 10/19/19 1717      PT SHORT TERM GOAL #1   Title Pt will participate in Bioness consult with clinical specialist to determine if pt will be appropriate for home Bioness    Baseline will be on 8/25    Time 4    Period Weeks    Status New    Target Date 11/18/19      PT SHORT TERM GOAL #2   Title Pt will decrease TUG time with LBQC and AFO to </= 80 seconds with min A    Baseline 93 seconds with quad cane and AFO    Time 4    Period Weeks    Status Revised    Target Date 11/18/19      PT SHORT TERM GOAL #3   Title Pt will be able to transfer w/c <> mat to L and R with quad cane, AFO, new shoe and Min Guard    Time 4    Period Weeks    Status New      Target Date 11/18/19      PT SHORT TERM GOAL #4   Title Pt will be able to perform gait velocity assessment along full distance with cane, AFO and new shoe    Baseline Not assessed    Time 4    Period Weeks    Status New    Target Date 11/18/19             PT Long Term Goals - 10/19/19 1721      PT LONG TERM GOAL #1   Title Wife will demonstrate ability to don/doff AFO with new shoe and inspect skin for pressure areas or blisters    Time 8    Period Weeks    Status New    Target Date 12/18/19      PT LONG TERM GOAL #2   Title Pt will improve TUG with cane and AFO to </= 70 seconds    Time 8    Period Weeks    Status Revised    Target Date 12/18/19      PT LONG TERM GOAL #3   Title Patient will improve gait velocity over full distance with quad cane and AFO to >0.8 ft/sec    Time 8    Period Weeks    Status Revised    Target Date 12/18/19      PT LONG TERM GOAL #4   Title Bioness goal if indicated after consultation      PT LONG TERM GOAL #5   Title Pt will demonstrate ability to transfer w/c <> mat/chair with quad cane, AFO to L and R consistently with supervision    Time 8    Period Weeks    Status Revised    Target Date 12/18/19                 Plan - 11/03/19 1407  Clinical Impression Statement Today's skilled session continued to address gait with cane at wall working towards carryover at home. Limited today due to pt not feeling well. Unable to have spouse hands on as she was outside on an important phone call. Will plan for this at pt's next visit. Remainder of session focused on left LE strengthening out of brace. Will continue to progress toward goals as able.    Personal Factors and Comorbidities Age;Fitness;Past/Current Experience;Comorbidity 3+;Time since onset of injury/illness/exacerbation    Comorbidities anixety & depression, CAD, diverticulosis, gout, HLD, HTN, left hemiplegia, NSTEMI (2013), morbid obesity, PE (2016), CVA (2015),  tubular adenoma of colon, OSA w/CPAP, right hip fx w/ORIF (2016), iron deficiency anemia    Examination-Activity Limitations Squat;Stairs;Stand;Dressing;Transfers;Bed Mobility;Locomotion Level    Examination-Participation Restrictions Church;Community Activity    Stability/Clinical Decision Making Evolving/Moderate complexity    Rehab Potential Good    PT Frequency 2x / week    PT Duration 8 weeks    PT Treatment/Interventions ADLs/Self Care Home Management;Electrical Stimulation;DME Instruction;Balance training;Therapeutic exercise;Orthotic Fit/Training;Gait training;Neuromuscular re-education;Stair training;Functional mobility training;Patient/family education;Therapeutic activities;Manual techniques;Compression bandaging;Passive range of motion    PT Next Visit Plan Did she don shoe over AFO with him pushing down?  Walking with cane and wall on L with wife assisting - have them start at home.  Continue standing tolerance and endurance, weight shifting, gait training with new AFO. R sidelying for L hip ROM and strengthening.  Bioness in training mode, during Mat exercises in sitting/supine if he can tolerate ankle, sit > stand training with Bioness.  No walking with Bioness right now, only AFO - Bioness consult on 8/25.  Power w/c trial and safety assessment?    Consulted and Agree with Plan of Care Patient;Family member/caregiver    Family Member Consulted wife           Patient will benefit from skilled therapeutic intervention in order to improve the following deficits and impairments:  Abnormal gait, Decreased range of motion, Difficulty walking, Impaired tone, Obesity, Impaired UE functional use, Decreased endurance, Decreased activity tolerance, Decreased skin integrity, Decreased balance, Decreased knowledge of use of DME, Impaired flexibility, Postural dysfunction, Impaired sensation, Increased edema, Decreased strength, Decreased mobility  Visit Diagnosis: Other abnormalities of gait  and mobility  Muscle weakness (generalized)  Unsteadiness on feet  Repeated falls  Foot drop, left     Problem List Patient Active Problem List   Diagnosis Date Noted  . Left foot drop 07/14/2019  . GERD (gastroesophageal reflux disease) 06/18/2019  . Tinea cruris 07/20/2017  . Hemorrhoid 07/20/2017  . Irritability 06/08/2017  . Fall at home 05/06/2017  . Research exam 11/28/2016  . Skin mass 11/28/2016  . Lower urinary tract symptoms (LUTS) 09/05/2016  . Hearing loss 09/05/2016  . Excessive daytime sleepiness 04/16/2016  . Exposure to TB 02/21/2016  . Vitamin D deficiency 02/21/2016  . Anemia 11/10/2015  . Left thigh pain 11/10/2015  . Neck pain 10/12/2015  . Hemiplegia affecting left side in left-dominant patient as late effect of cerebrovascular disease ([place]) 09/03/2015  . Medicare annual wellness visit, subsequent 08/14/2015  . Acute pulmonary embolism ([place]) 07/25/2015  . Chest wall pain 07/15/2015  . History of pulmonary embolism 07/15/2015  . Chronic anticoagulation 05/22/2015  . Gait disturbance 05/15/2015  . DNR (do not resuscitate) 01/19/2015  . Hypotension 01/14/2015  . Paresthesia   . Depression 12/14/2014  . OSA (obstructive sleep apnea) 11/30/2014  . Chronic combined systolic and diastolic congestive heart failure ([place]) 11/11/2014  . SOB (  shortness of breath) 04/17/2014  . Hemiplegia affecting left dominant side ([place]) 02/16/2014  . 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 06/30/2011    Willow Ora, PTA, [facility] [address] [place], [place] [address] [phone] 11/04/19, 11:43 AM   Name: Gregory Wiley MRN: [id number] Date of Birth: [birth year]

## 2019-11-06 ENCOUNTER — Other Ambulatory Visit: Payer: Self-pay | Admitting: Family Medicine

## 2019-11-06 DIAGNOSIS — E559 Vitamin D deficiency, unspecified: Secondary | ICD-10-CM

## 2019-11-06 DIAGNOSIS — D509 Iron deficiency anemia, unspecified: Secondary | ICD-10-CM

## 2019-11-06 DIAGNOSIS — Z8673 Personal history of transient ischemic attack (TIA), and cerebral infarction without residual deficits: Secondary | ICD-10-CM

## 2019-11-08 ENCOUNTER — Ambulatory Visit: Payer: Medicare Other | Admitting: Physical Therapy

## 2019-11-08 ENCOUNTER — Other Ambulatory Visit: Payer: Self-pay

## 2019-11-08 DIAGNOSIS — R2689 Other abnormalities of gait and mobility: Secondary | ICD-10-CM | POA: Diagnosis not present

## 2019-11-08 DIAGNOSIS — R296 Repeated falls: Secondary | ICD-10-CM

## 2019-11-08 DIAGNOSIS — M6281 Muscle weakness (generalized): Secondary | ICD-10-CM

## 2019-11-08 DIAGNOSIS — R2681 Unsteadiness on feet: Secondary | ICD-10-CM

## 2019-11-08 DIAGNOSIS — M21372 Foot drop, left foot: Secondary | ICD-10-CM

## 2019-11-08 DIAGNOSIS — R293 Abnormal posture: Secondary | ICD-10-CM

## 2019-11-08 DIAGNOSIS — I69354 Hemiplegia and hemiparesis following cerebral infarction affecting left non-dominant side: Secondary | ICD-10-CM

## 2019-11-08 NOTE — Therapy (Signed)
[facility] [address] [place], [place], [address] Phone: [phone]   Fax:  [phone]  Physical Therapy Treatment and 10th visit Progress Note  Patient Details  Name: Gregory Wiley MRN: [id number] Date of Birth: Aug 10, 1950 Referring Provider (PT): Felecia Shelling, Nanine Means, MD   Encounter Date: 11/08/2019   Progress Note Reporting Period 09/22/2019 to 11/08/2019  See note below for Objective Data and Assessment of Progress/Goals.      PT End of Session - 11/08/19 1740    Visit Number 39    Number of Visits 50    Date for PT Re-Evaluation 12/18/19    Authorization Type [employer] Medicare - 10th visit PN    Progress Note Due on Visit 46    PT Start Time 1450    PT Stop Time 1536    PT Time Calculation (min) 46 min    Equipment Utilized During Treatment Gait belt;Other (comment)   Bioness   Activity Tolerance Patient tolerated treatment well    Behavior During Therapy WFL for tasks assessed/performed           Past Medical History:  Diagnosis Date  . Adrenal insufficiency ([place])   . Anxiety   . Basal cell carcinoma of skin of left ankle   . Bleeding stomach ulcer 2015  . Coronary artery disease    stent, then CABG 07/20/10  . Daily headache    "for the past month" (11/11/2014)  . Depression   . Diverticulosis   . GERD (gastroesophageal reflux disease)   . Gout   . H/O cardiomyopathy    ischemic, now last echo 07/30/11, EF 38%W  . History of hiatal hernia   . Hyperlipidemia   . Hypertension   . Internal hemorrhoids   . Left hemiplegia ([place])   . NSTEMI (non-ST elevated myocardial infarction) ([place])    NSTEMI- last cath 06/2011-Stent to LCX-DES, last nuc 07/30/11 low risk  . Obesity (BMI 30-39.9)   . OSA on CPAP    since July 2013- uses CPAP sometimes , states he has lost 147 lbs. since stroke & doesn't use the CPAP as much as he use to.   . Plantar fasciitis of left foot   . Pneumonia    hosp. 12/2014  . Pulmonary  embolism ([place]) 03/2014  . Stroke ([place]) 01/2014   "no control of LUE, can slightly LLE; memory problems since" (11/11/2014)  . Tubular adenoma of colon     Past Surgical History:  Procedure Laterality Date  . BASAL CELL CARCINOMA EXCISION Left 2015   ankle  . CARDIAC SURGERY    . CATARACT EXTRACTION Bilateral   . COLONOSCOPY N/A 02/10/2013   Procedure: COLONOSCOPY;  Surgeon: Ladene Artist, MD;  Location: [facility] ENDOSCOPY;  Service: Endoscopy;  Laterality: N/A;  . CORONARY ANGIOPLASTY WITH STENT PLACEMENT  07/01/2011   DES-Resolute to native LCX  . CORONARY ANGIOPLASTY WITH STENT PLACEMENT  12/01/2007   COMPLEX 5 LESION PCI INCLUDING CUTTING BALLOON AND 4 CYPHER DESs  . CORONARY ARTERY BYPASS GRAFT  07/20/2010    LIMA-LAD; VG-ACUTE MARG of RCA; Seq VG-distal RCA & then pda  . EP IMPLANTABLE DEVICE N/A 02/14/2016   Procedure: Loop Recorder Removal;  Surgeon: Thompson Grayer, MD;  Location: [facility];  Service: Cardiovascular;  Laterality: N/A;  . ESOPHAGOGASTRODUODENOSCOPY  01/2014   gastric ulcer, erosive gastroduodenitis, H Pylori negative.   . ESOPHAGOGASTRODUODENOSCOPY (EGD) WITH PROPOFOL N/A 11/14/2014   Procedure: ESOPHAGOGASTRODUODENOSCOPY (EGD) WITH PROPOFOL;  Surgeon: Quillian Quince  Merrily Brittle, MD;  Location: [facility] ENDOSCOPY;  Service: Endoscopy;  Laterality: N/A;  . HAND SURGERY Right    to take glass out  . HERNIA REPAIR    . INTRAMEDULLARY (IM) NAIL INTERTROCHANTERIC Right 02/07/2015   Procedure: INTRAMEDULLARY (IM) NAIL INTERTROCHANTRIC RIGHT HIP;  Surgeon: Renette Butters, MD;  Location: [facility];  Service: Orthopedics;  Laterality: Right;  . KNEE ARTHROSCOPY Right   . LEFT HEART CATHETERIZATION WITH CORONARY/GRAFT ANGIOGRAM  07/01/2011   Procedure: LEFT HEART CATHETERIZATION WITH Beatrix Fetters;  Surgeon: Sanda Klein, MD;  Location: [facility];  Service: Cardiovascular;;  . LOOP RECORDER IMPLANT  02/15/2014   MDT LINQ implanted by Dr Rayann Heman for cryptogenic stroke  .  PERCUTANEOUS CORONARY STENT INTERVENTION (PCI-S) Right 07/01/2011   Procedure: PERCUTANEOUS CORONARY STENT INTERVENTION (PCI-S);  Surgeon: Sanda Klein, MD;  Location: [facility] C[facility] LAB;  Service: Cardiovascular;  Laterality: Right;  . PICC LINE PLACE PERIPHERAL ([place] HX)  06/2015  . RETINAL DETACHMENT SURGERY Right   . TEE WITHOUT CARDIOVERSION N/A 02/15/2014   Procedure: TRANSESOPHAGEAL ECHOCARDIOGRAM (TEE);  Surgeon: Josue [facility], MD;  Location: [facility] ENDOSCOPY;  Service: Cardiovascular;  Laterality: N/A;  . UMBILICAL HERNIA REPAIR  2006    There were no vitals filed for this visit.   Subjective Assessment - 11/08/19 1456    Subjective Has an appointment on Friday for bloodwork but has not had LLE examined regarding edema.  Still having a hard time getting shoe/AFO on but did put foot on floor today.  Did not get to try walking down hallways this weekend.  Got his gait belt back.    Patient is accompained by: Family member   in lobby/car on phone call   Pertinent History DNR; anxiety & depression, CAD with CABG, diverticulosis, gout, HLD, HTN, left hemiplegia, NSTEMI (2013), morbid obesity, PE (2016), CVA (2015), tubular adenoma of colon, OSA w/CPAP, right hip fx w/ORIF (2016), iron deficiency anemia    Limitations Standing;Walking    Patient Stated Goals to obtain a Bioness for home use; to return to PLOF of ambulating household distances and short community distances with quad cane    Currently in Pain? No/denies                             [facility] Adult PT Treatment/Exercise - 11/08/19 1735      Transfers   Transfers Sit to Stand;Stand to Sit;Stand Pivot Transfers    Sit to Stand 4: Min assist;5: Supervision    Sit to Stand Details (indicate cue type and reason) from elevated mat with repetition pt able to perform sit > stand with supervision; min A from w/c    Stand to Sit 4: Min guard    Stand Pivot Transfers 4: Min assist    Stand Pivot Transfer Details (indicate cue  type and reason) Performed with AFO and shoe on; performed to L and then R with quad cane; no assistance needed to pivot to L today; continues to require cues to fully pivot to R before sitting      Exercises   Exercises Other Exercises    Other Exercises  Pt better able to tolerate electrical stimulation today and able to elicit ankle DF activation.  With Bioness in training mode 8 seconds on, 10 seconds off performed isolated closed chain ankle DF on rockerboard; when stimulation off cued pt to push down for PF activation as well.  Turned off lower cuff and performed  training with upper cuff on quad focusing on closed chain knee extension with sit > squat x 10 reps x 8 second hold with min A and cues to shift weight over to L to bring COG into midline and increase activation/use of LLE due to RLE fatigue.      Modalities   Modalities Electrical engineer Stimulation Location L anterior tibialis and L quad muscles    Electrical Stimulation Action closed chain ankle DF and knee extension    Electrical Stimulation Parameters see updated parameters in Tablet 1; steering electrode    Electrical Stimulation Goals Strength;Tone;Edema;Neuromuscular facilitation;Other (comment)   ROM                 PT Education - 11/08/19 1739    Education Details Bioness representative on Wednesday    Person(s) Educated Patient;Spouse    Methods Explanation    Comprehension Verbalized understanding            PT Short Term Goals - 10/19/19 1717      PT SHORT TERM GOAL #1   Title Pt will participate in [place] consult with clinical specialist to determine if pt will be appropriate for home Bioness    Baseline will be on 8/25    Time 4    Period Weeks    Status New    Target Date 11/18/19      PT SHORT TERM GOAL #2   Title Pt will decrease TUG time with LBQC and AFO to </= 80 seconds with min A    Baseline 93 seconds with quad cane and AFO    Time 4     Period Weeks    Status Revised    Target Date 11/18/19      PT SHORT TERM GOAL #3   Title Pt will be able to transfer w/c <> mat to L and R with quad cane, AFO, new shoe and Min Guard    Time 4    Period Weeks    Status New    Target Date 11/18/19      PT SHORT TERM GOAL #4   Title Pt will be able to perform gait velocity assessment along full distance with cane, AFO and new shoe    Baseline Not assessed    Time 4    Period Weeks    Status New    Target Date 11/18/19             PT Long Term Goals - 10/19/19 1721      PT LONG TERM GOAL #1   Title Wife will demonstrate ability to don/doff AFO with new shoe and inspect skin for pressure areas or blisters    Time 8    Period Weeks    Status New    Target Date 12/18/19      PT LONG TERM GOAL #2   Title Pt will improve TUG with cane and AFO to </= 70 seconds    Time 8    Period Weeks    Status Revised    Target Date 12/18/19      PT LONG TERM GOAL #3   Title Patient will improve gait velocity over full distance with quad cane and AFO to >0.8 ft/sec    Time 8    Period Weeks    Status Revised    Target Date 12/18/19      PT LONG TERM GOAL #4   Title Bioness  goal if indicated after consultation      PT LONG TERM GOAL #5   Title Pt will demonstrate ability to transfer w/c <> mat/chair with quad cane, AFO to L and R consistently with supervision    Time 8    Period Weeks    Status Revised    Target Date 12/18/19                 Plan - 11/08/19 1740    Clinical Impression Statement Able to return to use of Bioness functional estim today with pt demonstrating improved tolerance and activation of L ankle DF for closed chain strengthening and NMR.  Pt also demonstrating improved sequencing and decreased assistance for sit > stand and stand pivots towards impaired side.  Pt and wife have been educated on other ways to don AFO into shoe and have initiated new technique.  Pt and wife require ongoing education and  training for home ambulation and standing exercises.  Will work with [employer] rep on Wednesday to see if pt will benefit from [place] home use.    Personal Factors and Comorbidities Age;Fitness;Past/Current Experience;Comorbidity 3+;Time since onset of injury/illness/exacerbation    Comorbidities anixety & depression, CAD, diverticulosis, gout, HLD, HTN, left hemiplegia, NSTEMI (2013), morbid obesity, PE (2016), CVA (2015), tubular adenoma of colon, OSA w/CPAP, right hip fx w/ORIF (2016), iron deficiency anemia    Examination-Activity Limitations Squat;Stairs;Stand;Dressing;Transfers;Bed Mobility;Locomotion Level    Examination-Participation Restrictions Church;Community Activity    Stability/Clinical Decision Making Evolving/Moderate complexity    Rehab Potential Good    PT Frequency 2x / week    PT Duration 8 weeks    PT Treatment/Interventions ADLs/Self Care Home Management;Electrical Stimulation;DME Instruction;Balance training;Therapeutic exercise;Orthotic Fit/Training;Gait training;Neuromuscular re-education;Stair training;Functional mobility training;Patient/family education;Therapeutic activities;Manual techniques;Compression bandaging;Passive range of motion    PT Next Visit Plan Bioness rep present.  Walking with cane and wall on L with wife assisting - have them start at home.  Continue standing tolerance and endurance, weight shifting, gait training with new AFO. R sidelying for L hip ROM and strengthening.  Bioness in training mode, during Mat exercises in sitting/supine if he can tolerate ankle, sit > stand training with Bioness.  No walking with Bioness right now, only AFO - Bioness consult on 8/25.  Power w/c trial and safety assessment?    Consulted and Agree with Plan of Care Patient;Family member/caregiver    Family Member Consulted wife           Patient will benefit from skilled therapeutic intervention in order to improve the following deficits and impairments:  Abnormal gait,  Decreased range of motion, Difficulty walking, Impaired tone, Obesity, Impaired UE functional use, Decreased endurance, Decreased activity tolerance, Decreased skin integrity, Decreased balance, Decreased knowledge of use of DME, Impaired flexibility, Postural dysfunction, Impaired sensation, Increased edema, Decreased strength, Decreased mobility  Visit Diagnosis: Other abnormalities of gait and mobility  Muscle weakness (generalized)  Unsteadiness on feet  Repeated falls  Foot drop, left  Abnormal posture  Spastic hemiplegia of left nondominant side as late effect of cerebral infarction [facility])     Problem List Patient Active Problem List   Diagnosis Date Noted  . Left foot drop 07/14/2019  . GERD (gastroesophageal reflux disease) 06/18/2019  . Tinea cruris 07/20/2017  . Hemorrhoid 07/20/2017  . Irritability 06/08/2017  . Fall at home 05/06/2017  . Research exam 11/28/2016  . Skin mass 11/28/2016  . Lower urinary tract symptoms (LUTS) 09/05/2016  . Hearing loss 09/05/2016  . Excessive daytime  sleepiness 04/16/2016  . Exposure to TB 02/21/2016  . Vitamin D deficiency 02/21/2016  . Anemia 11/10/2015  . Left thigh pain 11/10/2015  . Neck pain 10/12/2015  . Hemiplegia affecting left side in left-dominant patient as late effect of cerebrovascular disease ([place]) 09/03/2015  . Medicare annual wellness visit, subsequent 08/14/2015  . Acute pulmonary embolism ([place]) 07/25/2015  . Chest wall pain 07/15/2015  . History of pulmonary embolism 07/15/2015  . Chronic anticoagulation 05/22/2015  . Gait disturbance 05/15/2015  . DNR (do not resuscitate) 01/19/2015  . Hypotension 01/14/2015  . Paresthesia   . Depression 12/14/2014  . OSA (obstructive sleep apnea) 11/30/2014  . Chronic combined systolic and diastolic congestive heart failure ([place]) 11/11/2014  . SOB (shortness of breath) 04/17/2014  . Hemiplegia affecting left dominant side ([place]) 02/16/2014  . 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 06/30/2011    [name], PT, DPT 11/08/19    5:45 PM    [facility] [address] [place], [place], [address] Phone: [phone]   Fax:  [phone]  Name: Gregory Wiley MRN: [id number] Date of Birth: 1950/08/02

## 2019-11-09 ENCOUNTER — Encounter (INDEPENDENT_AMBULATORY_CARE_PROVIDER_SITE_OTHER): Payer: Self-pay

## 2019-11-10 ENCOUNTER — Other Ambulatory Visit: Payer: Self-pay

## 2019-11-10 ENCOUNTER — Ambulatory Visit: Payer: Medicare Other | Admitting: Physical Therapy

## 2019-11-10 DIAGNOSIS — R2689 Other abnormalities of gait and mobility: Secondary | ICD-10-CM

## 2019-11-10 DIAGNOSIS — M21372 Foot drop, left foot: Secondary | ICD-10-CM

## 2019-11-10 DIAGNOSIS — R2681 Unsteadiness on feet: Secondary | ICD-10-CM

## 2019-11-10 DIAGNOSIS — R293 Abnormal posture: Secondary | ICD-10-CM

## 2019-11-10 DIAGNOSIS — R296 Repeated falls: Secondary | ICD-10-CM

## 2019-11-10 DIAGNOSIS — I69354 Hemiplegia and hemiparesis following cerebral infarction affecting left non-dominant side: Secondary | ICD-10-CM

## 2019-11-10 DIAGNOSIS — M6281 Muscle weakness (generalized): Secondary | ICD-10-CM

## 2019-11-10 DIAGNOSIS — M25672 Stiffness of left ankle, not elsewhere classified: Secondary | ICD-10-CM

## 2019-11-10 NOTE — Therapy (Signed)
[facility] [address] [place], [place], [address] Phone: [phone]   Fax:  [phone]  Physical Therapy Treatment  Patient Details  Name: Gregory Wiley MRN: [id number] Date of Birth: [birth year] Referring Provider (PT): Felecia Shelling, Nanine Means, MD   Encounter Date: 11/10/2019   PT End of Session - 11/10/19 2053    Visit Number 40    Number of Visits 50    Date for PT Re-Evaluation 12/18/19    Authorization Type [employer] Medicare - 10th visit PN    Progress Note Due on Visit 38    PT Start Time 1405    PT Stop Time 1450    PT Time Calculation (min) 45 min    Equipment Utilized During Treatment Gait belt;Other (comment)   Bioness   Activity Tolerance Patient tolerated treatment well    Behavior During Therapy WFL for tasks assessed/performed           Past Medical History:  Diagnosis Date  . Adrenal insufficiency ([place])   . Anxiety   . Basal cell carcinoma of skin of left ankle   . Bleeding stomach ulcer 2015  . Coronary artery disease    stent, then CABG 07/20/10  . Daily headache    "for the past month" (11/11/2014)  . Depression   . Diverticulosis   . GERD (gastroesophageal reflux disease)   . Gout   . H/O cardiomyopathy    ischemic, now last echo 07/30/11, EF 38%W  . History of hiatal hernia   . Hyperlipidemia   . Hypertension   . Internal hemorrhoids   . Left hemiplegia ([place])   . NSTEMI (non-ST elevated myocardial infarction) ([place])    NSTEMI- last cath 06/2011-Stent to LCX-DES, last nuc 07/30/11 low risk  . Obesity (BMI 30-39.9)   . OSA on CPAP    since July 2013- uses CPAP sometimes , states he has lost 147 lbs. since stroke & doesn't use the CPAP as much as he use to.   . Plantar fasciitis of left foot   . Pneumonia    hosp. 12/2014  . Pulmonary embolism ([place]) 03/2014  . Stroke ([place]) 01/2014   "no control of LUE, can slightly LLE; memory problems since" (11/11/2014)  . Tubular adenoma of colon     Past  Surgical History:  Procedure Laterality Date  . BASAL CELL CARCINOMA EXCISION Left 2015   ankle  . CARDIAC SURGERY    . CATARACT EXTRACTION Bilateral   . COLONOSCOPY N/A 02/10/2013   Procedure: COLONOSCOPY;  Surgeon: Ladene Artist, MD;  Location: [facility] ENDOS[facility];  Service: Endoscopy;  Laterality: N/A;  . CORONARY ANGIOPLASTY WITH STENT PLACEMENT  07/01/2011   DES-Resolute to native LCX  . CORONARY ANGIOPLASTY WITH STENT PLACEMENT  12/01/2007   COMPLEX 5 LESION PCI INCLUDING CUTTING BALLOON AND 4 CYPHER DESs  . CORONARY ARTERY BYPASS GRAFT  07/20/2010    LIMA-LAD; VG-ACUTE MARG of RCA; Seq VG-distal RCA & then pda  . EP IMPLANTABLE DEVICE N/A 02/14/2016   Procedure: Loop Recorder Removal;  Surgeon: Thompson Grayer, MD;  Location: [facility] CV LAB;  Service: Cardiovascular;  Laterality: N/A;  . ESOPHAGOGASTRODUODENOSCOPY  01/2014   gastric ulcer, erosive gastroduodenitis, H Pylori negative.   . ESOPHAGOGASTRODUODENOSCOPY (EGD) WITH PROPOFOL N/A 11/14/2014   Procedure: ESOPHAGOGASTRODUODENOSCOPY (EGD) WITH PROPOFOL;  Surgeon: Milus Banister, MD;  Location: [facility];  Service: Endoscopy;  Laterality: N/A;  . HAND SURGERY Right    to take glass out  . HERNIA  REPAIR    . INTRAMEDULLARY (IM) NAIL INTERTROCHANTERIC Right 02/07/2015   Procedure: INTRAMEDULLARY (IM) NAIL INTERTROCHANTRIC RIGHT HIP;  Surgeon: Renette Butters, MD;  Location: [facility];  Service: Orthopedics;  Laterality: Right;  . KNEE ARTHROSCOPY Right   . LEFT HEART CATHETERIZATION WITH CORONARY/GRAFT ANGIOGRAM  07/01/2011   Procedure: LEFT HEART CATHETERIZATION WITH Beatrix Fetters;  Surgeon: Sanda Klein, MD;  Location: [facility] CATH LAB;  Service: Cardiovascular;;  . LOOP RECORDER IMPLANT  02/15/2014   MDT LINQ implanted by Dr Rayann Heman for cryptogenic stroke  . PERCUTANEOUS CORONARY STENT INTERVENTION (PCI-S) Right 07/01/2011   Procedure: PERCUTANEOUS CORONARY STENT INTERVENTION (PCI-S);  Surgeon: Sanda Klein, MD;  Location[facility]  CATH LAB;  Service: Cardiovascular;  Laterality: Right;  . PICC LINE PLACE PERIPHERAL ([place] HX)  06/2015  . RETINAL DETACHMENT SURGERY Right   . TEE WITHOUT CARDIOVERSION N/A 02/15/2014   Procedure: TRANSESOPHAGEAL ECHOCARDIOGRAM (TEE);  Surgeon: Josue Hector, MD;  Location: [facility] ENDOSCOPY;  Service: Cardiovascular;  Laterality: N/A;  . UMBILICAL HERNIA REPAIR  2006    There were no vitals filed for this visit.   Subjective Assessment - 11/10/19 2033    Subjective Pt not feeling well today; didn't sleep well last night.  "I've had a hangover for 6 years!"  Bioness representative present to assist with settings.    Patient is accompained by: Family member   in lobby/car on phone call   Pertinent History DNR; anxiety & depression, CAD with CABG, diverticulosis, gout, HLD, HTN, left hemiplegia, NSTEMI (2013), morbid obesity, PE (2016), CVA (2015), tubular adenoma of colon, OSA w/CPAP, right hip fx w/ORIF (2016), iron deficiency anemia    Limitations Standing;Walking    Patient Stated Goals to obtain a Bioness for home use; to return to PLOF of ambulating household distances and short community distances with quad cane    Currently in Pain? No/denies                             [facility] Adult PT Treatment/Exercise - 11/10/19 2036      Transfers   Transfers Sit to Stand;Stand to Sit    Sit to Stand 4: Min assist    Sit to Stand Details (indicate cue type and reason) Cues to bring head into midline and to keep COG moving forwards    Stand to Sit 4: Min guard      Ambulation/Gait   Ambulation/Gait Yes    Ambulation/Gait Assistance 4: Min assist    Ambulation/Gait Assistance Details Returned to gait with Bioness in gait mode but with thigh cuff on hamstrings today.  With new settings pt initially demonstrated improved hip and knee flexion for improved clearance and step length.  Continued to require assistance to prevent L hip external rotation and adduction for swing phase and  provided cues for increased stance time and hip extension on LLE and for increased step length RLE.  Able to elicit ankle DF activation but continues to have limited DF ROM for heel strike at initial stance.    Ambulation Distance (Feet) 12 Feet   30, 12 again    Assistive device Large base quad cane    Gait Pattern Step-through pattern;Decreased step length - left;Decreased step length - right;Decreased stance time - left;Decreased stride length;Decreased dorsiflexion - left;Left genu recurvatum;Lateral trunk lean to right;Decreased trunk rotation    Ambulation Surface Level;Indoor      Therapeutic Activites    Therapeutic Activities Other Therapeutic Activities  Other Therapeutic Activities Bioness representative verbalized various options for home unit - upper and lower cuff or thigh stand alone with AFO.  Also educated pt and wife on how to set up appointment with [facility] to assess if he would be approved for Bioness through the [place].  Also educated pt and wife on how much a Bioness will cost out of pocket.  Wife to look into setting up an appointment at the [place].  PT to continue to determine most effective combination for home unit.      Modalities   Modalities Electrical Stimulation      Electrical Stimulation   Electrical Stimulation Location L anterior tibialis and L hamstring muscle groups; not able to perform on quads due to skin laceration from pt scratching skin    Electrical Stimulation Action open and closed chain ankle DF, knee flexion and hip extension    Electrical Stimulation Parameters see updated parameters in Tablet 1, steering electrode.  Also attempted to use heel sensor under mid foot due to pt not achieving heel strike at initial contact with little to no change in gait or DF activation timing.      Electrical Stimulation Goals Strength;Tone;Edema;Neuromuscular facilitation;Other (comment)                  PT Education - 11/10/19 2052    Education Details Information  on home Bioness    Person(s) Educated Patient;Spouse    Methods Explanation    Comprehension Verbalized understanding            PT Short Term Goals - 10/19/19 1717      PT SHORT TERM GOAL #1   Title Pt will participate in [place] consult with clinical specialist to determine if pt will be appropriate for home Bioness    Baseline will be on 8/25    Time 4    Period Weeks    Status New    Target Date 11/18/19      PT SHORT TERM GOAL #2   Title Pt will decrease TUG time with LBQC and AFO to </= 80 seconds with min A    Baseline 93 seconds with quad cane and AFO    Time 4    Period Weeks    Status Revised    Target Date 11/18/19      PT SHORT TERM GOAL #3   Title Pt will be able to transfer w/c <> mat to L and R with quad cane, AFO, new shoe and Min Guard    Time 4    Period Weeks    Status New    Target Date 11/18/19      PT SHORT TERM GOAL #4   Title Pt will be able to perform gait velocity assessment along full distance with cane, AFO and new shoe    Baseline Not assessed    Time 4    Period Weeks    Status New    Target Date 11/18/19             PT Long Term Goals - 10/19/19 1721      PT LONG TERM GOAL #1   Title Wife will demonstrate ability to don/doff AFO with new shoe and inspect skin for pressure areas or blisters    Time 8    Period Weeks    Status New    Target Date 12/18/19      PT LONG TERM GOAL #2   Title Pt will improve TUG with cane and  AFO to </= 70 seconds    Time 8    Period Weeks    Status Revised    Target Date 12/18/19      PT LONG TERM GOAL #3   Title Patient will improve gait velocity over full distance with quad cane and AFO to >0.8 ft/sec    Time 8    Period Weeks    Status Revised    Target Date 12/18/19      PT LONG TERM GOAL #4   Title Bioness goal if indicated after consultation      PT LONG TERM GOAL #5   Title Pt will demonstrate ability to transfer w/c <> mat/chair with quad cane, AFO to L and R consistently with  supervision    Time 8    Period Weeks    Status Revised    Target Date 12/18/19                 Plan - 11/10/19 2053    Clinical Impression Statement Continued to tolerate use of Bioness today but switched upper cuff to hamstring due to skin laceration on L anterior thigh.  With upper cuff on hamstring pt did demonstrate improved hip and knee flexion activation for increased LLE clearance and step length with swing phase.  ROM continues to limit ankle DF and ankle rocker during gait.  Discussed options for home Bioness; will trial AFO with thigh unit only next session.  Not able to trial round cloth electrodes with Bioness representative to see if pt is able to elicit greater ankle DF.  Heel sensor under mid-food did not affect ankle DF activation timing during gait today.  Will continue to address and progress towards goals.    Personal Factors and Comorbidities Age;Fitness;Past/Current Experience;Comorbidity 3+;Time since onset of injury/illness/exacerbation    Comorbidities anixety & depression, CAD, diverticulosis, gout, HLD, HTN, left hemiplegia, NSTEMI (2013), morbid obesity, PE (2016), CVA (2015), tubular adenoma of colon, OSA w/CPAP, right hip fx w/ORIF (2016), iron deficiency anemia    Examination-Activity Limitations Squat;Stairs;Stand;Dressing;Transfers;Bed Mobility;Locomotion Level    Examination-Participation Restrictions Church;Community Activity    Stability/Clinical Decision Making Evolving/Moderate complexity    Rehab Potential Good    PT Frequency 2x / week    PT Duration 8 weeks    PT Treatment/Interventions ADLs/Self Care Home Management;Electrical Stimulation;DME Instruction;Balance training;Therapeutic exercise;Orthotic Fit/Training;Gait training;Neuromuscular re-education;Stair training;Functional mobility training;Patient/family education;Therapeutic activities;Manual techniques;Compression bandaging;Passive range of motion    PT Next Visit Plan Trial Bioness thigh  with AFO.  Bioness on hamstring now.  Walking with cane and wall on L with wife assisting - have them start at home.  Continue standing tolerance and endurance, weight shifting, gait training with new AFO. R sidelying for L hip ROM and strengthening.    Consulted and Agree with Plan of Care Patient;Family member/caregiver    Family Member Consulted wife           Patient will benefit from skilled therapeutic intervention in order to improve the following deficits and impairments:  Abnormal gait, Decreased range of motion, Difficulty walking, Impaired tone, Obesity, Impaired UE functional use, Decreased endurance, Decreased activity tolerance, Decreased skin integrity, Decreased balance, Decreased knowledge of use of DME, Impaired flexibility, Postural dysfunction, Impaired sensation, Increased edema, Decreased strength, Decreased mobility  Visit Diagnosis: Other abnormalities of gait and mobility  Muscle weakness (generalized)  Unsteadiness on feet  Foot drop, left  Repeated falls  Abnormal posture  Spastic hemiplegia of left nondominant side as late effect of cerebral infarction (  [place])  Stiffness of left ankle, not elsewhere classified     Problem List Patient Active Problem List   Diagnosis Date Noted  . Left foot drop 07/14/2019  . GERD (gastroesophageal reflux disease) 06/18/2019  . Tinea cruris 07/20/2017  . Hemorrhoid 07/20/2017  . Irritability 06/08/2017  . Fall at home 05/06/2017  . Research exam 11/28/2016  . Skin mass 11/28/2016  . Lower urinary tract symptoms (LUTS) 09/05/2016  . Hearing loss 09/05/2016  . Excessive daytime sleepiness 04/16/2016  . Exposure to TB 02/21/2016  . Vitamin D deficiency 02/21/2016  . Anemia 11/10/2015  . Left thigh pain 11/10/2015  . Neck pain 10/12/2015  . Hemiplegia affecting left side in left-dominant patient as late effect of cerebrovascular disease ([place]) 09/03/2015  . Medicare annual wellness visit, subsequent 08/14/2015  .  Acute pulmonary embolism ([place]) 07/25/2015  . Chest wall pain 07/15/2015  . History of pulmonary embolism 07/15/2015  . Chronic anticoagulation 05/22/2015  . Gait disturbance 05/15/2015  . DNR (do not resuscitate) 01/19/2015  . Hypotension 01/14/2015  . Paresthesia   . Depression 12/14/2014  . OSA (obstructive sleep apnea) 11/30/2014  . Chronic combined systolic and diastolic congestive heart failure ([place]) 11/11/2014  . SOB (shortness of breath) 04/17/2014  . Hemiplegia affecting left dominant side ([place]) 02/16/2014  . 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 06/30/2011    [name], PT, DPT 11/10/19    8:59 PM    [facility] [address] [place], [place], [address] Phone: [phone]   Fax:  [phone]  Name: OREY MOURE MRN: [id number] Date of Birth: 10/25/1950

## 2019-11-11 ENCOUNTER — Encounter: Payer: Self-pay | Admitting: Cardiology

## 2019-11-12 ENCOUNTER — Other Ambulatory Visit: Payer: Medicare Other

## 2019-11-15 ENCOUNTER — Other Ambulatory Visit: Payer: Self-pay

## 2019-11-15 ENCOUNTER — Ambulatory Visit: Payer: Medicare Other | Admitting: Physical Therapy

## 2019-11-15 ENCOUNTER — Encounter: Payer: Self-pay | Admitting: Physical Therapy

## 2019-11-15 DIAGNOSIS — M6281 Muscle weakness (generalized): Secondary | ICD-10-CM

## 2019-11-15 DIAGNOSIS — M21372 Foot drop, left foot: Secondary | ICD-10-CM

## 2019-11-15 DIAGNOSIS — R2689 Other abnormalities of gait and mobility: Secondary | ICD-10-CM | POA: Diagnosis not present

## 2019-11-15 DIAGNOSIS — I69354 Hemiplegia and hemiparesis following cerebral infarction affecting left non-dominant side: Secondary | ICD-10-CM

## 2019-11-15 DIAGNOSIS — M25672 Stiffness of left ankle, not elsewhere classified: Secondary | ICD-10-CM

## 2019-11-15 DIAGNOSIS — R2681 Unsteadiness on feet: Secondary | ICD-10-CM

## 2019-11-15 DIAGNOSIS — R293 Abnormal posture: Secondary | ICD-10-CM

## 2019-11-15 DIAGNOSIS — R296 Repeated falls: Secondary | ICD-10-CM

## 2019-11-16 NOTE — Therapy (Signed)
[facility] [address] [place], [place], [address] Phone: [phone]   Fax:  [phone]  Physical Therapy Treatment  Patient Details  Name: Gregory Wiley MRN: [id number] Date of Birth: 30-Oct-1950 Referring Provider (PT): Felecia Shelling, Nanine Means, MD   Encounter Date: 11/15/2019   PT End of Session - 11/16/19 1604    Visit Number 41    Number of Visits 50    Date for PT Re-Evaluation 12/18/19    Authorization Type [employer] Medicare - 10th visit PN    Progress Note Due on Visit 6    PT Start Time 1448    PT Stop Time 1531    PT Time Calculation (min) 43 min    Equipment Utilized During Treatment Gait belt;Other (comment)   Bioness   Activity Tolerance Patient limited by fatigue    Behavior During Therapy [facility] for tasks assessed/performed           Past Medical History:  Diagnosis Date  . Adrenal insufficiency ([place])   . Anxiety   . Basal cell carcinoma of skin of left ankle   . Bleeding stomach ulcer 2015  . Coronary artery disease    stent, then CABG 07/20/10  . Daily headache    "for the past month" (11/11/2014)  . Depression   . Diverticulosis   . GERD (gastroesophageal reflux disease)   . Gout   . H/O cardiomyopathy    ischemic, now last echo 07/30/11, EF 38%W  . History of hiatal hernia   . Hyperlipidemia   . Hypertension   . Internal hemorrhoids   . Left hemiplegia ([place])   . NSTEMI (non-ST elevated myocardial infarction) ([place])    NSTEMI- last cath 06/2011-Stent to LCX-DES, last nuc 07/30/11 low risk  . Obesity (BMI 30-39.9)   . OSA on CPAP    since July 2013- uses CPAP sometimes , states he has lost 147 lbs. since stroke & doesn't use the CPAP as much as he use to.   . Plantar fasciitis of left foot   . Pneumonia    hosp. 12/2014  . Pulmonary embolism ([place]) 03/2014  . Stroke ([place]) 01/2014   "no control of LUE, can slightly LLE; memory problems since" (11/11/2014)  . Tubular adenoma of colon     Past  Surgical History:  Procedure Laterality Date  . BASAL CELL CARCINOMA EXCISION Left 2015   ankle  . CARDIAC SURGERY    . CATARACT EXTRACTION Bilateral   . COLONOSCOPY N/A 02/10/2013   Procedure: COLONOSCOPY;  Surgeon: Ladene Artist, MD;  Location: [facility] ENDOSCOPY;  Service: Endoscopy;  Laterality: N/A;  . CORONARY ANGIOPLASTY WITH STENT PLACEMENT  07/01/2011   DES-Resolute to native LCX  . CORONARY ANGIOPLASTY WITH STENT PLACEMENT  12/01/2007   COMPLEX 5 LESION PCI INCLUDING CUTTING BALLOON AND 4 CYPHER DESs  . CORONARY ARTERY BYPASS GRAFT  07/20/2010    LIMA-LAD; VG-ACUTE MARG of RCA; Seq VG-distal RCA & then pda  . EP IMPLANTABLE DEVICE N/A 02/14/2016   Procedure: Loop Recorder Removal;  Surgeon: Thompson Grayer, MD;  Location: [facility] CV LAB;  Service: Cardiovascular;  Laterality: N/A;  . ESOPHAGOGASTRODUODENOSCOPY  01/2014   gastric ulcer, erosive gastroduodenitis, H Pylori negative.   . ESOPHAGOGASTRODUODENOSCOPY (EGD) WITH PROPOFOL N/A 11/14/2014   Procedure: ESOPHAGOGASTRODUODENOSCOPY (EGD) WITH PROPOFOL;  Surgeon: Milus Banister, MD;  Location: [facility];  Service: Endoscopy;  Laterality: N/A;  . HAND SURGERY Right    to take glass out  . HERNIA  REPAIR    . INTRAMEDULLARY (IM) NAIL INTERTROCHANTERIC Right 02/07/2015   Procedure: INTRAMEDULLARY (IM) NAIL INTERTROCHANTRIC RIGHT HIP;  Surgeon: Renette Butters, MD;  Location: [facility];  Service: Orthopedics;  Laterality: Right;  . KNEE ARTHROSCOPY Right   . LEFT HEART CATHETERIZATION WITH CORONARY/GRAFT ANGIOGRAM  07/01/2011   Procedure: LEFT HEART CATHETERIZATION WITH Beatrix Fetters;  Surgeon: Sanda Klein, MD;  Location: [facility] CATH LAB;  Service: Cardiovascular;;  . LOOP RECORDER IMPLANT  02/15/2014   MDT LINQ implanted by Dr Rayann Heman for cryptogenic stroke  . PERCUTANEOUS CORONARY STENT INTERVENTION (PCI-S) Right 07/01/2011   Procedure: PERCUTANEOUS CORONARY STENT INTERVENTION (PCI-S);  Surgeon: Sanda Klein, MD;  Location: [facility]  CATH LAB;  Service: Cardiovascular;  Laterality: Right;  . PICC LINE PLACE PERIPHERAL ([place] HX)  06/2015  . RETINAL DETACHMENT SURGERY Right   . TEE WITHOUT CARDIOVERSION N/A 02/15/2014   Procedure: TRANSESOPHAGEAL ECHOCARDIOGRAM (TEE);  Surgeon: Josue Hector, MD;  Location: [facility] ENDOSCOPY;  Service: Cardiovascular;  Laterality: N/A;  . UMBILICAL HERNIA REPAIR  2006    There were no vitals filed for this visit.   Subjective Assessment - 11/15/19 1450    Subjective "One positive thing about Wednesday's meeting was I was able to get my foot flat on the floor for 4 days straight, but it pulled on the muscle behind my knee."  Wife still having a hard time getting the shoe and AFO on; too difficult for her to lean down to the floor and patient's recliner is too low to put foot on foot stool.  Filled out initial paperwork at the [place], waiting on a call to schedule initial visit.    Patient is accompained by: Family member   in lobby/car on phone call   Pertinent History DNR; anxiety & depression, CAD with CABG, diverticulosis, gout, HLD, HTN, left hemiplegia, NSTEMI (2013), morbid obesity, PE (2016), CVA (2015), tubular adenoma of colon, OSA w/CPAP, right hip fx w/ORIF (2016), iron deficiency anemia    Limitations Standing;Walking    Patient Stated Goals to obtain a Bioness for home use; to return to PLOF of ambulating household distances and short community distances with quad cane    Currently in Pain? No/denies                             [facility] Adult PT Treatment/Exercise - 11/15/19 1453      Transfers   Transfers Sit to Stand;Stand to Sit    Sit to Stand 4: Min guard    Stand to Sit 4: Min guard      Ambulation/Gait   Ambulation/Gait Yes    Ambulation/Gait Assistance 3: Mod assist    Ambulation/Gait Assistance Details Continued gait training with Bioness but with lower cuff turned off today so pt could perform gait with AFO and upper thigh cuff to simulate stand alone cuff.   Pt continued to demonstrate improved knee flexion initiation at terminal stance and improved knee control during stance phase but also continues to require facilitation from therapist to prevent hip ER and adduction during swing phase and frequent rest breaks due to fatigue - pt reporting feeling like his LE are going to give out or his head is "closing in" on him.  During gait training pt wore Pulse Ox and no desaturation observed.  As pt fatigues he demonstrates delay in initiating L swing and can cause his body to pitch forwards requiring assistance to prevent fall  Ambulation Distance (Feet) 15 Feet   x 6 reps to complete full lap around gym   Assistive device Large base quad cane    Gait Pattern Step-through pattern;Decreased step length - left;Decreased step length - right;Decreased stance time - left;Decreased stride length;Decreased dorsiflexion - left;Left genu recurvatum;Lateral trunk lean to right;Decreased trunk rotation    Ambulation Surface Level;Indoor      Therapeutic Activites    Therapeutic Activities Other Therapeutic Activities    Other Therapeutic Activities Due to ongoing difficulty with donning AFO and shoe recommended that wife assist pt with transferring to transport chair (higher than recliner) and then place L foot on foot stool to don shoe/AFO.  Wife to trial at home      Modalities   Modalities Teacher, English as a foreign language Location Lower cuff donned over AFO but stimulation turned off so that PT can simulate thigh only cuff using sensor in lower cuff; upper cuff on L hamstring muscle group    Electrical Stimulation Action open and closed chain hip extension and knee flexion to prevent genu recurvatum    Electrical Stimulation Parameters See updated parameters in Tablet 1    Electrical Stimulation Goals Strength;Tone;Edema;Neuromuscular facilitation;Other (comment)                  PT Education - 11/16/19 1603      Education Details see TA    Person(s) Educated Patient;Spouse    Methods Explanation    Comprehension Verbalized understanding            PT Short Term Goals - 10/19/19 1717      PT SHORT TERM GOAL #1   Title Pt will participate in [place] consult with clinical specialist to determine if pt will be appropriate for home Bioness    Baseline will be on 8/25    Time 4    Period Weeks    Status New    Target Date 11/18/19      PT SHORT TERM GOAL #2   Title Pt will decrease TUG time with LBQC and AFO to </= 80 seconds with min A    Baseline 93 seconds with quad cane and AFO    Time 4    Period Weeks    Status Revised    Target Date 11/18/19      PT SHORT TERM GOAL #3   Title Pt will be able to transfer w/c <> mat to L and R with quad cane, AFO, new shoe and Min Guard    Time 4    Period Weeks    Status New    Target Date 11/18/19      PT SHORT TERM GOAL #4   Title Pt will be able to perform gait velocity assessment along full distance with cane, AFO and new shoe    Baseline Not assessed    Time 4    Period Weeks    Status New    Target Date 11/18/19             PT Long Term Goals - 10/19/19 1721      PT LONG TERM GOAL #1   Title Wife will demonstrate ability to don/doff AFO with new shoe and inspect skin for pressure areas or blisters    Time 8    Period Weeks    Status New    Target Date 12/18/19      PT LONG TERM GOAL #2   Title  Pt will improve TUG with cane and AFO to </= 70 seconds    Time 8    Period Weeks    Status Revised    Target Date 12/18/19      PT LONG TERM GOAL #3   Title Patient will improve gait velocity over full distance with quad cane and AFO to >0.8 ft/sec    Time 8    Period Weeks    Status Revised    Target Date 12/18/19      PT LONG TERM GOAL #4   Title Bioness goal if indicated after consultation      PT LONG TERM GOAL #5   Title Pt will demonstrate ability to transfer w/c <> mat/chair with quad cane, AFO to L and R  consistently with supervision    Time 8    Period Weeks    Status Revised    Target Date 12/18/19                 Plan - 11/16/19 1604    Clinical Impression Statement Wife continues to require assistance for problem solving most effective way to don AFO and shoe at home.  Pt continued gait training wiht AFO and Bioness to simulate stand alone thigh cuff with improved swing LLE clearance and stance phase stability.  Will return to use of both cuffs at next session to compare safety and efficiency.    Personal Factors and Comorbidities Age;Fitness;Past/Current Experience;Comorbidity 3+;Time since onset of injury/illness/exacerbation    Comorbidities anixety & depression, CAD, diverticulosis, gout, HLD, HTN, left hemiplegia, NSTEMI (2013), morbid obesity, PE (2016), CVA (2015), tubular adenoma of colon, OSA w/CPAP, right hip fx w/ORIF (2016), iron deficiency anemia    Examination-Activity Limitations Squat;Stairs;Stand;Dressing;Transfers;Bed Mobility;Locomotion Level    Examination-Participation Restrictions Church;Community Activity    Stability/Clinical Decision Making Evolving/Moderate complexity    Rehab Potential Good    PT Frequency 2x / week    PT Duration 8 weeks    PT Treatment/Interventions ADLs/Self Care Home Management;Electrical Stimulation;DME Instruction;Balance training;Therapeutic exercise;Orthotic Fit/Training;Gait training;Neuromuscular re-education;Stair training;Functional mobility training;Patient/family education;Therapeutic activities;Manual techniques;Compression bandaging;Passive range of motion    PT Next Visit Plan Check STG.  GO back to both cuffs and ambulate to see difference?  Did she try transferring to transport w/c and then don AFO/shoe?  Bioness on hamstring now.  Walking with cane and wall on L with wife assisting - have them start at home.  Continue standing tolerance and endurance, weight shifting, gait training with new AFO. R sidelying for L hip ROM  and strengthening.    Consulted and Agree with Plan of Care Patient;Family member/caregiver    Family Member Consulted wife           Patient will benefit from skilled therapeutic intervention in order to improve the following deficits and impairments:  Abnormal gait, Decreased range of motion, Difficulty walking, Impaired tone, Obesity, Impaired UE functional use, Decreased endurance, Decreased activity tolerance, Decreased skin integrity, Decreased balance, Decreased knowledge of use of DME, Impaired flexibility, Postural dysfunction, Impaired sensation, Increased edema, Decreased strength, Decreased mobility  Visit Diagnosis: Other abnormalities of gait and mobility  Muscle weakness (generalized)  Unsteadiness on feet  Foot drop, left  Repeated falls  Abnormal posture  Spastic hemiplegia of left nondominant side as late effect of cerebral infarction (HCC)  Stiffness of left ankle, not elsewhere classified     Problem List Patient Active Problem List   Diagnosis Date Noted  . Left foot drop 07/14/2019  . GERD (gastroesophageal  reflux disease) 06/18/2019  . Tinea cruris 07/20/2017  . Hemorrhoid 07/20/2017  . Irritability 06/08/2017  . Fall at home 05/06/2017  . Research exam 11/28/2016  . Skin mass 11/28/2016  . Lower urinary tract symptoms (LUTS) 09/05/2016  . Hearing loss 09/05/2016  . Excessive daytime sleepiness 04/16/2016  . Exposure to TB 02/21/2016  . Vitamin D deficiency 02/21/2016  . Anemia 11/10/2015  . Left thigh pain 11/10/2015  . Neck pain 10/12/2015  . Hemiplegia affecting left side in left-dominant patient as late effect of cerebrovascular disease ([place]) 09/03/2015  . Medicare annual wellness visit, subsequent 08/14/2015  . Acute pulmonary embolism ([place]) 07/25/2015  . Chest wall pain 07/15/2015  . History of pulmonary embolism 07/15/2015  . Chronic anticoagulation 05/22/2015  . Gait disturbance 05/15/2015  . DNR (do not resuscitate) 01/19/2015    . Hypotension 01/14/2015  . Paresthesia   . Depression 12/14/2014  . OSA (obstructive sleep apnea) 11/30/2014  . Chronic combined systolic and diastolic congestive heart failure ([place]) 11/11/2014  . SOB (shortness of breath) 04/17/2014  . Hemiplegia affecting left dominant side ([place]) 02/16/2014  . 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 06/30/2011    [name], PT, DPT 11/16/19    4:09 PM     [facility] [address] [place], [place], [address] Phone: [phone]   Fax:  [phone]  Name: MATHIEU SCHLOEMER MRN: [id number] Date of Birth: [birth year]

## 2019-11-17 ENCOUNTER — Other Ambulatory Visit: Payer: Self-pay

## 2019-11-17 ENCOUNTER — Ambulatory Visit: Payer: Medicare Other | Attending: Neurology | Admitting: Physical Therapy

## 2019-11-17 DIAGNOSIS — R293 Abnormal posture: Secondary | ICD-10-CM | POA: Diagnosis present

## 2019-11-17 DIAGNOSIS — R2689 Other abnormalities of gait and mobility: Secondary | ICD-10-CM | POA: Diagnosis present

## 2019-11-17 DIAGNOSIS — I69354 Hemiplegia and hemiparesis following cerebral infarction affecting left non-dominant side: Secondary | ICD-10-CM | POA: Diagnosis present

## 2019-11-17 DIAGNOSIS — R2681 Unsteadiness on feet: Secondary | ICD-10-CM | POA: Insufficient documentation

## 2019-11-17 DIAGNOSIS — M21372 Foot drop, left foot: Secondary | ICD-10-CM | POA: Diagnosis present

## 2019-11-17 DIAGNOSIS — R296 Repeated falls: Secondary | ICD-10-CM

## 2019-11-17 DIAGNOSIS — M6281 Muscle weakness (generalized): Secondary | ICD-10-CM | POA: Insufficient documentation

## 2019-11-17 NOTE — Therapy (Signed)
[facility] [address] [place], [place], [address] Phone: [phone]   Fax:  [phone]  Physical Therapy Treatment  Patient Details  Name: Gregory Wiley MRN: [id number] Date of Birth: 1950/09/18 Referring Provider (PT): Felecia Shelling, Nanine Means, MD   Encounter Date: 11/17/2019   PT End of Session - 11/17/19 1637    Visit Number 42    Number of Visits 50    Date for PT Re-Evaluation 12/18/19    Authorization Type [employer] Medicare - 10th visit PN    Progress Note Due on Visit 8    PT Start Time 1319    PT Stop Time 1357    PT Time Calculation (min) 38 min    Equipment Utilized During Treatment Gait belt;Other (comment)   Bioness   Activity Tolerance Patient limited by fatigue    Behavior During Therapy [facility] for tasks assessed/performed           Past Medical History:  Diagnosis Date  . Adrenal insufficiency ([place])   . Anxiety   . Basal cell carcinoma of skin of left ankle   . Bleeding stomach ulcer 2015  . Coronary artery disease    stent, then CABG 07/20/10  . Daily headache    "for the past month" (11/11/2014)  . Depression   . Diverticulosis   . GERD (gastroesophageal reflux disease)   . Gout   . H/O cardiomyopathy    ischemic, now last echo 07/30/11, EF 38%W  . History of hiatal hernia   . Hyperlipidemia   . Hypertension   . Internal hemorrhoids   . Left hemiplegia ([place])   . NSTEMI (non-ST elevated myocardial infarction) ([place])    NSTEMI- last cath 06/2011-Stent to LCX-DES, last nuc 07/30/11 low risk  . Obesity (BMI 30-39.9)   . OSA on CPAP    since July 2013- uses CPAP sometimes , states he has lost 147 lbs. since stroke & doesn't use the CPAP as much as he use to.   . Plantar fasciitis of left foot   . Pneumonia    hosp. 12/2014  . Pulmonary embolism ([place]) 03/2014  . Stroke ([place]) 01/2014   "no control of LUE, can slightly LLE; memory problems since" (11/11/2014)  . Tubular adenoma of colon     Past  Surgical History:  Procedure Laterality Date  . BASAL CELL CARCINOMA EXCISION Left 2015   ankle  . CARDIAC SURGERY    . CATARACT EXTRACTION Bilateral   . COLONOSCOPY N/A 02/10/2013   Procedure: COLONOSCOPY;  Surgeon: Ladene Artist, MD;  Location: [facility] ENDOSCOPY;  Service: Endoscopy;  Laterality: N/A;  . CORONARY ANGIOPLASTY WITH STENT PLACEMENT  07/01/2011   DES-Resolute to native LCX  . CORONARY ANGIOPLASTY WITH STENT PLACEMENT  12/01/2007   COMPLEX 5 LESION PCI INCLUDING CUTTING BALLOON AND 4 CYPHER DESs  . CORONARY ARTERY BYPASS GRAFT  07/20/2010    LIMA-LAD; VG-ACUTE MARG of RCA; Seq VG-distal RCA & then pda  . EP IMPLANTABLE DEVICE N/A 02/14/2016   Procedure: Loop Recorder Removal;  Surgeon: Thompson Grayer, MD;  Location: [facility] CV LAB;  Service: Cardiovascular;  Laterality: N/A;  . ESOPHAGOGASTRODUODENOSCOPY  01/2014   gastric ulcer, erosive gastroduodenitis, H Pylori negative.   . ESOPHAGOGASTRODUODENOSCOPY (EGD) WITH PROPOFOL N/A 11/14/2014   Procedure: ESOPHAGOGASTRODUODENOSCOPY (EGD) WITH PROPOFOL;  Surgeon: Milus Banister, MD;  Location: [facility];  Service: Endoscopy;  Laterality: N/A;  . HAND SURGERY Right    to take glass out  . HERNIA  REPAIR    . INTRAMEDULLARY (IM) NAIL INTERTROCHANTERIC Right 02/07/2015   Procedure: INTRAMEDULLARY (IM) NAIL INTERTROCHANTRIC RIGHT HIP;  Surgeon: Renette [place]ters, MD;  Location: [facility];  Service: Orthopedics;  Laterality: Right;  . KNEE ARTHROSCOPY Right   . LEFT HEART CATHETERIZATION WITH CORONARY/GRAFT ANGIOGRAM  07/01/2011   Procedure: LEFT HEART CATHETERIZATION WITH Beatrix Fetters;  Surgeon: Sanda Klein, MD;  Location: [facility] CATH LAB;  Service: Cardiovascular;;  . LOOP RECORDER IMPLANT  02/15/2014   MDT LINQ implanted by Dr Rayann Heman for cryptogenic stroke  . PERCUTANEOUS CORONARY STENT INTERVENTION (PCI-S) Right 07/01/2011   Procedure: PERCUTANEOUS CORONARY STENT INTERVENTION (PCI-S);  Surgeon: Sanda Klein, MD;  Location: [facility]  CATH LAB;  Service: Cardiovascular;  Laterality: Right;  . PICC LINE PLACE PERIPHERAL ([place] HX)  06/2015  . RETINAL DETACHMENT SURGERY Right   . TEE WITHOUT CARDIOVERSION N/A 02/15/2014   Procedure: TRANSESOPHAGEAL ECHOCARDIOGRAM (TEE);  Surgeon: Josue Hector, MD;  Location: [facility] ENDOSCOPY;  Service: Cardiovascular;  Laterality: N/A;  . UMBILICAL HERNIA REPAIR  2006    There were no vitals filed for this visit.   Subjective Assessment - 11/17/19 1329    Subjective Pt reports feeling very fatigued after last session but more tired today.  Woke up from a nap prior to therapy session.    Patient is accompained by: Family member   in lobby/car on phone call   Pertinent History DNR; anxiety & depression, CAD with CABG, diverticulosis, gout, HLD, HTN, left hemiplegia, NSTEMI (2013), morbid obesity, PE (2016), CVA (2015), tubular adenoma of colon, OSA w/CPAP, right hip fx w/ORIF (2016), iron deficiency anemia    Limitations Standing;Walking    Patient Stated Goals to obtain a Bioness for home use; to return to PLOF of ambulating household distances and short community distances with quad cane                             [facility] Adult PT Treatment/Exercise - 11/17/19 1351      Transfers   Transfers Sit to Stand;Stand to Sit    Sit to Stand 4: Min guard    Stand to Sit 4: Min guard      Ambulation/Gait   Ambulation/Gait Yes    Ambulation/Gait Assistance 4: Min assist;3: Mod assist    Ambulation/Gait Assistance Details Initially requiring min A for upright posture but as distance progressed pt began to demonstrate increased ankle supination tone and inability to place heel on floor during gait cycle.  Adjusted settings and attempted gait again with pt demonstrating improved foot lowering to floor at initial contact and improved stance time and stability in stance.  Pt continued to experience fatigue and required multiple sitting rest breaks.  Pt felt it was easier to advance his LE  with both Bioness cuffs vs. AFO and thigh only.  Pt would like to proceed with getting both cuffs for home use.     Ambulation Distance (Feet) 15 Feet    Assistive device Large base quad cane    Gait Pattern Step-to pattern;Step-through pattern;Decreased step length - left;Decreased stance time - left;Decreased stride length;Decreased hip/knee flexion - left;Decreased dorsiflexion - left;Decreased weight shift to left;Trunk rotated posteriorly on left    Ambulation Surface Level;Indoor      Modalities   Modalities Teacher, English as a foreign language Location L anterior tib and L hamstring; no AFO today    Electrical Stimulation Action open  and closed chain ankle DF and knee flexion + hip extension during gait    Electrical Stimulation Parameters See parameters in Tablet 1, steering electrode    Electrical Stimulation Goals Strength;Tone;Edema;Neuromuscular facilitation;Other (comment)                    PT Short Term Goals - 10/19/19 1717      PT SHORT TERM GOAL #1   Title Pt will participate in Bioness consult with clinical specialist to determine if pt will be appropriate for home Bioness    Baseline will be on 8/25    Time 4    Period Weeks    Status New    Target Date 11/18/19      PT SHORT TERM GOAL #2   Title Pt will decrease TUG time with LBQC and AFO to </= 80 seconds with min A    Baseline 93 seconds with quad cane and AFO    Time 4    Period Weeks    Status Revised    Target Date 11/18/19      PT SHORT TERM GOAL #3   Title Pt will be able to transfer w/c <> mat to L and R with quad cane, AFO, new shoe and Min Guard    Time 4    Period Weeks    Status New    Target Date 11/18/19      PT SHORT TERM GOAL #4   Title Pt will be able to perform gait velocity assessment along full distance with cane, AFO and new shoe    Baseline Not assessed    Time 4    Period Weeks    Status New    Target Date 11/18/19              PT Long Term Goals - 10/19/19 1721      PT LONG TERM GOAL #1   Title Wife will demonstrate ability to don/doff AFO with new shoe and inspect skin for pressure areas or blisters    Time 8    Period Weeks    Status New    Target Date 12/18/19      PT LONG TERM GOAL #2   Title Pt will improve TUG with cane and AFO to </= 70 seconds    Time 8    Period Weeks    Status Revised    Target Date 12/18/19      PT LONG TERM GOAL #3   Title Patient will improve gait velocity over full distance with quad cane and AFO to >0.8 ft/sec    Time 8    Period Weeks    Status Revised    Target Date 12/18/19      PT LONG TERM GOAL #4   Title Bioness goal if indicated after consultation      PT LONG TERM GOAL #5   Title Pt will demonstrate ability to transfer w/c <> mat/chair with quad cane, AFO to L and R consistently with supervision    Time 8    Period Weeks    Status Revised    Target Date 12/18/19                 Plan - 11/17/19 1638    Clinical Impression Statement Continued to assess gait with Bioness but with both cuffs today.  Pt demonstrated ability to initiate more steps with less assistance from therapist and once settings for anterior tib adjusted pt able to place foot  fully on floor and demonstrated improved stance time on LLE.  Pt was more fatigued today so only able to ambulate x 3 reps.  Will alert Bioness rep to patient's decision; wife is still awaiting call back from [place] to start process.    Personal Factors and Comorbidities Age;Fitness;Past/Current Experience;Comorbidity 3+;Time since onset of injury/illness/exacerbation    Comorbidities anixety & depression, CAD, diverticulosis, gout, HLD, HTN, left hemiplegia, NSTEMI (2013), morbid obesity, PE (2016), CVA (2015), tubular adenoma of colon, OSA w/CPAP, right hip fx w/ORIF (2016), iron deficiency anemia    Examination-Activity Limitations Squat;Stairs;Stand;Dressing;Transfers;Bed Mobility;Locomotion Level     Examination-Participation Restrictions Church;Community Activity    Stability/Clinical Decision Making Evolving/Moderate complexity    Rehab Potential Good    PT Frequency 2x / week    PT Duration 8 weeks    PT Treatment/Interventions ADLs/Self Care Home Management;Electrical Stimulation;DME Instruction;Balance training;Therapeutic exercise;Orthotic Fit/Training;Gait training;Neuromuscular re-education;Stair training;Functional mobility training;Patient/family education;Therapeutic activities;Manual techniques;Compression bandaging;Passive range of motion    PT Next Visit Plan Check STG. Did she try transferring to transport w/c and then don AFO/shoe?  Bioness on hamstring now.  Walking with cane and wall on L with wife assisting - have them start at home.  Continue standing tolerance and endurance, weight shifting, gait training with new AFO. R sidelying for L hip ROM and strengthening.    Consulted and Agree with Plan of Care Patient;Family member/caregiver    Family Member Consulted wife           Patient will benefit from skilled therapeutic intervention in order to improve the following deficits and impairments:  Abnormal gait, Decreased range of motion, Difficulty walking, Impaired tone, Obesity, Impaired UE functional use, Decreased endurance, Decreased activity tolerance, Decreased skin integrity, Decreased balance, Decreased knowledge of use of DME, Impaired flexibility, Postural dysfunction, Impaired sensation, Increased edema, Decreased strength, Decreased mobility  Visit Diagnosis: Other abnormalities of gait and mobility  Muscle weakness (generalized)  Unsteadiness on feet  Foot drop, left  Repeated falls  Abnormal posture  Spastic hemiplegia of left nondominant side as late effect of cerebral infarction [facility])     Problem List Patient Active Problem List   Diagnosis Date Noted  . Left foot drop 07/14/2019  . GERD (gastroesophageal reflux disease) 06/18/2019  .  Tinea cruris 07/20/2017  . Hemorrhoid 07/20/2017  . Irritability 06/08/2017  . Fall at home 05/06/2017  . Research exam 11/28/2016  . Skin mass 11/28/2016  . Lower urinary tract symptoms (LUTS) 09/05/2016  . Hearing loss 09/05/2016  . Excessive daytime sleepiness 04/16/2016  . Exposure to TB 02/21/2016  . Vitamin D deficiency 02/21/2016  . Anemia 11/10/2015  . Left thigh pain 11/10/2015  . Neck pain 10/12/2015  . Hemiplegia affecting left side in left-dominant patient as late effect of cerebrovascular disease ([place]) 09/03/2015  . Medicare annual wellness visit, subsequent 08/14/2015  . Acute pulmonary embolism ([name]) 07/25/2015  . Chest wall pain 07/15/2015  . History of pulmonary embolism 07/15/2015  . Chronic anticoagulation 05/22/2015  . Gait disturbance 05/15/2015  . DNR (do not resuscitate) 01/19/2015  . Hypotension 01/14/2015  . Paresthesia   . Depression 12/14/2014  . OSA (obstructive sleep apnea) 11/30/2014  . Chronic combined systolic and diastolic congestive heart failure ([place]) 11/11/2014  . SOB (shortness of breath) 04/17/2014  . Hemiplegia affecting left dominant side ([place]) 02/16/2014  . 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 06/30/2011    [name], PT, DPT 11/17/19  4:43 PM    [facility] [address] [place], [place], [address] Phone: [phone]   Fax:  [phone]  Name: Gregory Wiley MRN: [id number] Date of Birth: 1950/11/15

## 2019-11-18 ENCOUNTER — Ambulatory Visit (INDEPENDENT_AMBULATORY_CARE_PROVIDER_SITE_OTHER): Payer: Medicare Other | Admitting: Family Medicine

## 2019-11-18 ENCOUNTER — Encounter: Payer: Self-pay | Admitting: Family Medicine

## 2019-11-18 VITALS — BP 118/70 | HR 73 | Temp 97.1°F | Ht 72.0 in | Wt 253.4 lb

## 2019-11-18 DIAGNOSIS — R202 Paresthesia of skin: Secondary | ICD-10-CM | POA: Diagnosis not present

## 2019-11-18 DIAGNOSIS — E559 Vitamin D deficiency, unspecified: Secondary | ICD-10-CM

## 2019-11-18 DIAGNOSIS — W19XXXA Unspecified fall, initial encounter: Secondary | ICD-10-CM | POA: Diagnosis not present

## 2019-11-18 DIAGNOSIS — R454 Irritability and anger: Secondary | ICD-10-CM

## 2019-11-18 DIAGNOSIS — D509 Iron deficiency anemia, unspecified: Secondary | ICD-10-CM

## 2019-11-18 DIAGNOSIS — Z8673 Personal history of transient ischemic attack (TIA), and cerebral infarction without residual deficits: Secondary | ICD-10-CM

## 2019-11-18 DIAGNOSIS — Y92009 Unspecified place in unspecified non-institutional (private) residence as the place of occurrence of the external cause: Secondary | ICD-10-CM

## 2019-11-18 DIAGNOSIS — R609 Edema, unspecified: Secondary | ICD-10-CM

## 2019-11-18 DIAGNOSIS — I69352 Hemiplegia and hemiparesis following cerebral infarction affecting left dominant side: Secondary | ICD-10-CM

## 2019-11-18 NOTE — Patient Instructions (Addendum)
Go to the lab on the way out.   If you have mychart we'll likely use that to update you.     Let me check on getting the yearly medicare visit set up by phone.    See if you can get a compression stock/sleeve that would be easier to use.   Take care.  Glad to see you.

## 2019-11-18 NOTE — Progress Notes (Signed)
This visit occurred during the SARS-CoV-2 public health emergency.  Safety protocols were in place, including screening questions prior to the visit, additional usage of staff PPE, and extensive cleaning of exam room while observing appropriate contact time as indicated for disinfecting solutions.  No recent NTG use.    History of anemia.  Still on ferrous sulfate.  See notes on labs.  Elevated Cholesterol/history of CVA. Using medications without problems: yes Muscle aches: no Diet compliance: encouraged.   Exercise: limited from CVA.   Labs pending.  Still on crestor.    Mood discussed with patient.  SSRI had been helpful.  No adverse effect on medication.  He was more irritable when he was off medication.  H/o CVA.  No new sx.  He has been in PT w/o sig change per patient's and wife's report.   H/o paresthesia with B12 pending.   He can get lightheaded on standing, w/o syncope.  Fall cautions d/w pt.  Fell on 10/05/19.  Had been vomiting on 10/03/19 (and again on the 28th).  Black, feculent at the time.  Resolved.  Bowel regimen d/w pt.  Taking [place] helped.    He has some L foot swelling, more than R side.  He had a compression stocking to use but application was difficult.  We talked about options.  They may be able to get compression stockings that zip up that would be easier to apply.  Meds, vitals, and allergies reviewed.   ROS: Per HPI unless specifically indicated in ROS section   GEN: nad, alert and oriented HEENT: ncat, in wheelchair at baseline. NECK: supple w/o LA CV: rrr. PULM: ctab, no inc wob ABD: soft, +bs EXT: no edema SKIN: no acute rash Weakness on left side at baseline.

## 2019-11-19 LAB — LIPID PANEL
Cholesterol: 124 mg/dL (ref 0–200)
HDL: 42.1 mg/dL (ref >39.00)
LDL Cholesterol: 64 mg/dL (ref 0–99)
NonHDL: 81.47
Total CHOL/HDL Ratio: 3
Triglycerides: 85 mg/dL (ref 0.0–149.0)
VLDL: 17 mg/dL (ref 0.0–40.0)

## 2019-11-19 LAB — COMPREHENSIVE METABOLIC PANEL
ALT: 10 U/L (ref 0–53)
AST: 16 U/L (ref 0–37)
Albumin: 4 g/dL (ref 3.5–5.2)
Alkaline Phosphatase: 59 U/L (ref 39–117)
BUN: 15 mg/dL (ref 6–23)
CO2: 27 mEq/L (ref 19–32)
Calcium: 8.8 mg/dL (ref 8.4–10.5)
Chloride: 104 mEq/L (ref 96–112)
Creatinine, Ser: 1.16 mg/dL (ref 0.40–1.50)
GFR: 62.26 mL/min (ref >60.00)
Glucose, Bld: 87 mg/dL (ref 70–99)
Potassium: 4.3 mEq/L (ref 3.5–5.1)
Sodium: 139 mEq/L (ref 135–145)
Total Bilirubin: 0.5 mg/dL (ref 0.2–1.2)
Total Protein: 6.7 g/dL (ref 6.0–8.3)

## 2019-11-19 LAB — VITAMIN B12: Vitamin B-12: 394 pg/mL (ref 211–911)

## 2019-11-19 LAB — VITAMIN D 25 HYDROXY (VIT D DEFICIENCY, FRACTURES): VITD: 51.77 ng/mL (ref 30.00–100.00)

## 2019-11-19 LAB — CBC WITH DIFFERENTIAL/PLATELET
Basophils Absolute: 0.1 10*3/uL (ref 0.0–0.1)
Basophils Relative: 0.9 % (ref 0.0–3.0)
Eosinophils Absolute: 0.4 10*3/uL (ref 0.0–0.7)
Eosinophils Relative: 6.8 % — ABNORMAL HIGH (ref 0.0–5.0)
HCT: 44.4 % (ref 39.0–52.0)
Hemoglobin: 14.7 g/dL (ref 13.0–17.0)
Lymphocytes Relative: 23 % (ref 12.0–46.0)
Lymphs Abs: 1.5 10*3/uL (ref 0.7–4.0)
MCHC: 33 g/dL (ref 30.0–36.0)
MCV: 90.5 fl (ref 78.0–100.0)
Monocytes Absolute: 0.4 10*3/uL (ref 0.1–1.0)
Monocytes Relative: 6.1 % (ref 3.0–12.0)
Neutro Abs: 4.1 10*3/uL (ref 1.4–7.7)
Neutrophils Relative %: 63.2 % (ref 43.0–77.0)
Platelets: 138 10*3/uL — ABNORMAL LOW (ref 150.0–400.0)
RBC: 4.91 Mil/uL (ref 4.22–5.81)
RDW: 13.4 % (ref 11.5–15.5)
WBC: 6.4 10*3/uL (ref 4.0–10.5)

## 2019-11-19 LAB — IRON: Iron: 66 ug/dL (ref 42–165)

## 2019-11-22 DIAGNOSIS — R609 Edema, unspecified: Secondary | ICD-10-CM | POA: Insufficient documentation

## 2019-11-22 NOTE — Assessment & Plan Note (Signed)
Since previous CVA.  No change in statin at this point.  See notes on labs.

## 2019-11-22 NOTE — Assessment & Plan Note (Signed)
  Mood discussed with patient.  SSRI had been helpful.  No adverse effect on medication.  He was more irritable when he was off medication.  Reasonable to continue.  He agrees.

## 2019-11-22 NOTE — Assessment & Plan Note (Signed)
See notes on B12.

## 2019-11-22 NOTE — Assessment & Plan Note (Signed)
He has some L foot swelling, more than R side.  He had a compression stocking to use but application was difficult.  We talked about options.  They may be able to get compression stockings that zip up that would be easier to apply.

## 2019-11-22 NOTE — Assessment & Plan Note (Signed)
History of anemia.  Still on ferrous sulfate.  See notes on labs.

## 2019-11-22 NOTE — Assessment & Plan Note (Addendum)
Fall cautions discussed with patient.  Cautions regarding getting lightheaded.  Would continue baseline medications, including midodrine, to try to prevent lightheadedness.  Previous vomiting has resolved, seems to be improved with bowel regimen.  Benign abdominal exam.

## 2019-11-25 ENCOUNTER — Other Ambulatory Visit: Payer: Self-pay | Admitting: Neurology

## 2019-12-15 ENCOUNTER — Encounter: Payer: Medicare Other | Admitting: Cardiology

## 2019-12-15 ENCOUNTER — Other Ambulatory Visit: Payer: Self-pay | Admitting: Cardiovascular Disease

## 2019-12-15 ENCOUNTER — Encounter: Payer: Self-pay | Admitting: Cardiology

## 2019-12-15 ENCOUNTER — Other Ambulatory Visit: Payer: Self-pay

## 2019-12-15 DIAGNOSIS — Z006 Encounter for examination for normal comparison and control in clinical research program: Secondary | ICD-10-CM

## 2019-12-15 NOTE — Progress Notes (Signed)
Patient being screened/followed for research.  Please see protocol below.  [employer] (protocol [id number]) Description: A randomized double-blind, placebo-controlled, multicenter trialassessing the impact of lipoprotein(a) lowering with pelacarsen (TQJ230) on major cardiovascular events in patients with established cardiovascular disease.    Adrian Prows, MD, [facility] 12/15/2019, 2:33 PM Office: [phone]

## 2019-12-28 ENCOUNTER — Ambulatory Visit: Payer: Medicare Other | Admitting: Podiatry

## 2019-12-29 ENCOUNTER — Other Ambulatory Visit: Payer: Self-pay

## 2019-12-29 ENCOUNTER — Encounter: Payer: Self-pay | Admitting: Neurology

## 2019-12-29 ENCOUNTER — Telehealth: Payer: Self-pay | Admitting: *Deleted

## 2019-12-29 ENCOUNTER — Ambulatory Visit (INDEPENDENT_AMBULATORY_CARE_PROVIDER_SITE_OTHER): Payer: Medicare Other | Admitting: Neurology

## 2019-12-29 VITALS — BP 116/76 | HR 75 | Ht 72.0 in | Wt 246.5 lb

## 2019-12-29 DIAGNOSIS — R269 Unspecified abnormalities of gait and mobility: Secondary | ICD-10-CM | POA: Diagnosis not present

## 2019-12-29 DIAGNOSIS — G4733 Obstructive sleep apnea (adult) (pediatric): Secondary | ICD-10-CM

## 2019-12-29 DIAGNOSIS — R202 Paresthesia of skin: Secondary | ICD-10-CM

## 2019-12-29 DIAGNOSIS — Z7901 Long term (current) use of anticoagulants: Secondary | ICD-10-CM

## 2019-12-29 DIAGNOSIS — I69952 Hemiplegia and hemiparesis following unspecified cerebrovascular disease affecting left dominant side: Secondary | ICD-10-CM

## 2019-12-29 MED ORDER — HYDROCODONE-ACETAMINOPHEN 5-325 MG PO TABS: 1.0000 | ORAL_TABLET | Freq: Every day | ORAL | 0 refills | Status: DC | PRN

## 2019-12-29 NOTE — Telephone Encounter (Signed)
Submitted PA hydrocodone on CMM. Key: [id number] - PA Case ID: [id number] - Rx #: [id number]. Waiting on determination from [facility]. Dx: neck pain.

## 2019-12-29 NOTE — Progress Notes (Signed)
[facility]  PATIENT: Gregory Wiley DOB: [birth year]  REFERRING CLINICIAN: Tamsen Roers HISTORY FROM: patient   HISTORICAL  CHIEF COMPLAINT:  Chief Complaint  Patient presents with  . Follow-up    RM 12 with wife. Last seen 07/14/2019. Requesting refill on hydrocodone. Feels armodafinil is not helping. Wanting to discuss stopping this.  . Sleep Apnea    On cpap  . 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   He feels he is stable for the most part.    Update 12/29/2019: He has stable left sided hemiplegia since the stroke in 2015.  With a cane he can walk 20-30 feet.   He can get to the toilet if he is wheeled to the door.  He is actively participating in physical therapy.   He wears a custom molded AFO to optimize the use of the left leg outside the house only.  He has no new stroke symptoms.   He last fell in July and needed help to get back up.Marland Kitchen    He has some lightheadedness but no syncope.    He has visual hallucinations seeing people or things in the house and outside but they are not troubling to him.   He sees things more in the left visual field than right.  CT earlier this year showed old atrophy and old R MCA stroke.    Speech is teh same with mild word finding difficulty.   He gets out of breath when he talks more.    Short term memory is reduced but about the same as last year.     He has OSA and is on CPAP.  He still has some lethargy and sleepiness.  In the past we tried Ritalin and Nuvigil at different times but there was no benefit.   He has sleep maintenance insomnia.    Doxepin and gabapentin have not helped.  Mood is about the same on Zoloft.   Armodafinil did not help the sleepiness or focus/attention any.   Stroke History:   On 02/12/2014, he woke up with weakness on the left side and was unable to stand up. He went to the emergency  room.Marland Kitchen MRI of the brain showed an acute right MCA territory stroke that mostly involved the right temporal lobe with extension to the insula and the basal ganglia on the right. MR angiogram showed an occlusion of the right M1 segment  Of note, he had been on aspirin and Plavix that had been discontinued a few weeks earlier due to a bleeding ulcer.   Ultrasound of the carotid arteries showed less than 39% stenosis. A transesophageal echocardiogram showed a LVEF equals 45%, there was septal hypokinesia, no PFO or ASD was identified.  Cardioembolism was suspected.  He was initially placed on Plavix but that was subsequently changed to Xarelto after he was found to have pulmonary embolisms while at rehabilitation center.    REVIEW OF SYSTEMS:  Constitutional: No fevers, chills, sweats, or change in appetite.  He sleeps poorly.    He has daytime sleepiness.   Eyes: No visual changes, double vision, eye pain Ear, nose and throat: No hearing loss, ear pain, nasal congestion, sore throat Cardiovascular: No chest pain, palpitations Respiratory:  No shortness of breath at rest or with exertion.   No wheezes.  He has OSA and is on CPAP GastrointestinaI:  No nausea, vomiting, diarrhea, abdominal pain, fecal incontinence Genitourinary:  No dysuria, urinary retention or frequency.  No nocturia. Musculoskeletal:  No neck pain, back pain but he has shoulder and hip pain Integumentary: No rash, pruritus, skin lesions Neurological: as above Psychiatric: No depression at this time.  No anxiety Endocrine: No palpitations, diaphoresis, change in appetite, change in weigh.  He notes feeling cold and increased thirst Hematologic/Lymphatic:  No anemia, purpura, petechiae. Allergic/Immunologic: No itchy/runny eyes, nasal congestion, recent allergic reactions, rashes  ALLERGIES: Allergies  Allergen Reactions  . Diphenhydramine Hcl Anaphylaxis  . Adhesive [Tape] Other (See Comments)    Tears skin - please use paper tape   . Proscar [Finasteride] Other (See Comments)    Lightheaded.   . Tamsulosin Other (See Comments)    Dizzy, vomiting.     HOME MEDICATIONS: Outpatient Medications Prior to Visit  Medication Sig Dispense Refill  . acetaminophen (TYLENOL) 325 MG tablet Take 1 tablet (325 mg total) by mouth every 6 (six) hours as needed for mild pain.    . Armodafinil 200 MG TABS TAKE 1 TABLET BY MOUTH EVERY DAY IN THE MORNING 30 tablet 5  . cholecalciferol (VITAMIN D) 1000 units tablet Take 1,000 Units by mouth daily.    . [facility] MELATONIN 10 MG CAPS TAKE 1 CAPSULE BY MOUTH AT BEDTIME AS NEEDED FOR SLEEP 90 capsule 3  . doxepin (SINEQUAN) 10 MG capsule TAKE 1 CAPSULE AT BEDTIME 90 capsule 1  . ELDERBERRY PO Take by mouth. 3 a day    . ELIQUIS 5 MG TABS tablet TAKE 1 TABLET BY MOUTH TWICE A DAY 180 tablet 2  . ferrous sulfate 325 (65 FE) MG tablet TAKE 1 TABLET TWICE A DAY WITH FOOD 180 tablet 1  . gabapentin (NEURONTIN) 300 MG capsule Take one or two at bedtime 60 capsule 11  . hydrocortisone (ANUSOL-HC) 25 MG suppository Place 1 suppository (25 mg total) rectally 2 (two) times daily as needed. X 1 week; then, use as needed thereafter. 24 suppository 3  . Krill Oil 1000 MG CAPS Take by mouth daily.    . lansoprazole (PREVACID) 30 MG capsule Take 1 capsule (30 mg total) by mouth 2 (two) times daily before a meal. 180 capsule 1  . midodrine (PROAMATINE) 2.5 MG tablet Take 1 tablet (2.5 mg total) by mouth 2 (two) times daily with a meal. 180 tablet 3  . nitroGLYCERIN (NITROSTAT) 0.4 MG SL tablet Place 1 tablet (0.4 mg total) under the tongue every 5 (five) minutes as needed for chest pain.    . NON FORMULARY Spirulina  3 capsules once daily    . NONFORMULARY OR COMPOUNDED ITEM Supplement menmoringa oleifera taken daily.    Marland Kitchen nystatin (MYCOSTATIN/NYSTOP) powder Apply topically 2 (two) times daily. 60 g 3  . OVER THE COUNTER MEDICATION Ceylon Cinnamon- takes 2 a day, 1200 mg    . potassium chloride (K-DUR) 10 MEQ  tablet TAKE 1 TABLETS BY MOUTH 2 TIMES DAILY. 360 tablet 3  . PRESCRIPTION MEDICATION Inhale into the lungs at bedtime. CPAP    . prochlorperazine (COMPAZINE) 10 MG tablet Take 1 tablet (10 mg total) by mouth every 8 (eight) hours as needed (headache, nausea or vomiting). 10 tablet 0  . QC LO-DOSE ASPIRIN 81 MG EC tablet TAKE 1 TABLET (81 MG TOTAL) BY MOUTH DAILY. SWALLOW WHOLE. 90 tablet 3  . QC STOOL SOFTENER PLS LAXATIVE 8.6-50 MG tablet TAKE 1 TABLET BY MOUTH 2 TIMES DAILY. 60 tablet 2  .  rosuvastatin (CRESTOR) 40 MG tablet Take 1 tablet (40 mg total) by mouth daily.    Marland Kitchen senna (SENOKOT) 8.6 MG tablet Take 1 tablet by mouth 2 (two) times daily.    . sertraline (ZOLOFT) 100 MG tablet TAKE 1 TABLET BY MOUTH EVERY DAY 90 tablet 3  . tiZANidine (ZANAFLEX) 4 MG capsule Take 1 capsule (4 mg total) by mouth as needed. 90 capsule 3  . HYDROcodone-acetaminophen (NORCO/VICODIN) 5-325 MG tablet Take 1 tablet by mouth daily as needed for moderate pain. 30 tablet 0   No facility-administered medications prior to visit.    PAST MEDICAL HISTORY: Past Medical History:  Diagnosis Date  . Adrenal insufficiency ([place])   . Anxiety   . Basal cell carcinoma of skin of left ankle   . Bleeding stomach ulcer 2015  . Coronary artery disease    stent, then CABG 07/20/10  . Daily headache    "for the past month" (11/11/2014)  . Depression   . Diverticulosis   . GERD (gastroesophageal reflux disease)   . Gout   . H/O cardiomyopathy    ischemic, now last echo 07/30/11, EF 38%W  . History of hiatal hernia   . Hyperlipidemia   . Hypertension   . Internal hemorrhoids   . Left hemiplegia ([place])   . NSTEMI (non-ST elevated myocardial infarction) ([place])    NSTEMI- last cath 06/2011-Stent to LCX-DES, last nuc 07/30/11 low risk  . Obesity (BMI 30-39.9)   . OSA on CPAP    since July 2013- uses CPAP sometimes , states he has lost 147 lbs. since stroke & doesn't use the CPAP as much as he use to.   . Plantar fasciitis of  left foot   . Pneumonia    hosp. 12/2014  . Pulmonary embolism ([place]) 03/2014  . Stroke ([place]) 01/2014   "no control of LUE, can slightly LLE; memory problems since" (11/11/2014)  . Tubular adenoma of colon     PAST SURGICAL HISTORY: Past Surgical History:  Procedure Laterality Date  . BASAL CELL CARCINOMA EXCISION Left 2015   ankle  . CARDIAC SURGERY    . CATARACT EXTRACTION Bilateral   . COLONOSCOPY N/A 02/10/2013   Procedure: COLONOSCOPY;  Surgeon: Ladene Artist, MD;  Location: [facility];  Service: Endoscopy;  Laterality: N/A;  . CORONARY ANGIOPLASTY WITH STENT PLACEMENT  07/01/2011   DES-Resolute to native LCX  . CORONARY ANGIOPLASTY WITH STENT PLACEMENT  12/01/2007   COMPLEX 5 LESION PCI INCLUDING CUTTING BALLOON AND 4 CYPHER DESs  . CORONARY ARTERY BYPASS GRAFT  07/20/2010    LIMA-LAD; VG-ACUTE MARG of RCA; Seq VG-distal RCA & then pda  . EP IMPLANTABLE DEVICE N/A 02/14/2016   Procedure: Loop Recorder Removal;  Surgeon: Thompson Grayer, MD;  Location: [facility];  Service: Cardiovascular;  Laterality: N/A;  . ESOPHAGOGASTRODUODENOSCOPY  01/2014   gastric ulcer, erosive gastroduodenitis, H Pylori negative.   . ESOPHAGOGASTRODUODENOSCOPY (EGD) WITH PROPOFOL N/A 11/14/2014   Procedure: ESOPHAGOGASTRODUODENOSCOPY (EGD) WITH PROPOFOL;  Surgeon: Milus Banister, MD;  Location: [facility];  Service: Endoscopy;  Laterality: N/A;  . HAND SURGERY Right    to take glass out  . HERNIA REPAIR    . INTRAMEDULLARY (IM) NAIL INTERTROCHANTERIC Right 02/07/2015   Procedure: INTRAMEDULLARY (IM) NAIL INTERTROCHANTRIC RIGHT HIP;  Surgeon: Renette Butters, MD;  Location: [facility];  Service: Orthopedics;  Laterality: Right;  . KNEE ARTHROSCOPY Right   . LEFT HEART CATHETERIZATION WITH CORONARY/GRAFT ANGIOGRAM  07/01/2011   Procedure: LEFT  HEART CATHETERIZATION WITH Beatrix Fetters;  Surgeon: Sanda Klein, MD;  Location: [facility] CATH LAB;  Service: Cardiovascular;;  . LOOP RECORDER IMPLANT   02/15/2014   MDT LINQ implanted by Dr Rayann Heman for cryptogenic stroke  . PERCUTANEOUS CORONARY STENT INTERVENTION (PCI-S) Right 07/01/2011   Procedure: PERCUTANEOUS CORONARY STENT INTERVENTION (PCI-S);  Surgeon: Sanda Klein, MD;  Location[facility] C[facility];  Service: Cardiovascular;  Laterality: Right;  . PICC LINE PLACE PERIPHERAL ([place] HX)  06/2015  . RETINAL DETACHMENT SURGERY Right   . TEE WITHOUT CARDIOVERSION N/A 02/15/2014   Procedure: TRANSESOPHAGEAL ECHOCARDIOGRAM (TEE);  Surgeon: Josue Hector, MD;  Location: [facility];  Service: Cardiovascular;  Laterality: N/A;  . UMBILICAL HERNIA REPAIR  2006    FAMILY HISTORY: Family History  Problem Relation Age of Onset  . Diabetes type II Mother   . CAD Father 76       CABG, PCI, died at 45  . Heart attack Father 64  . Hypertension Brother   . Diabetes Brother   . Hyperlipidemia Brother   . Brain cancer Maternal Grandmother   . COPD Paternal Grandfather   . Coronary artery disease Other   . Other Other        denies family history of DVT/PE  . Colon cancer Neg Hx   . Stroke Neg Hx   . Prostate cancer Neg Hx     SOCIAL HISTORY:  Social History   Socioeconomic History  . Marital status: Married    Spouse name: Not on file  . Number of children: 5  . Years of education: Not on file  . Highest education level: Not on file  Occupational History    Employer: [employer]    Comment: works at the [employer]  . Smoking status: Former Smoker    Packs/day: 2.00    Years: 4.00    Pack years: 8.00    Types: Cigarettes    Quit date: 01/30/1969    Years since quitting: 50.9  . Smokeless tobacco: Never Used  . Tobacco comment: "quit smoking cigarettes  in the 1970's"  Substance and Sexual Activity  . Alcohol use: No    Alcohol/week: 0.0 standard drinks  . Drug use: No    Comment: "smoked some pot in college"  . Sexual activity: Not Currently  Other Topics Concern  . Not on file  Social History Narrative    Quit smoking 40 years ago   Married 1996   Likes to play guitar and sing with church group unable since 02/12/14 stroke    [employer] 1970-1972, [place], no service related issues.     Left handed but now right handed after stroke   Social Determinants of Health   Financial Resource Strain:   . Difficulty of Paying Living Expenses: Not on file  Food Insecurity:   . Worried About Charity fundraiser in the Last Year: Not on file  . Ran Out of Food in the Last Year: Not on file  Transportation Needs:   . Lack of Transportation (Medical): Not on file  . Lack of Transportation (Non-Medical): Not on file  Physical Activity:   . Days of Exercise per Week: Not on file  . Minutes of Exercise per Session: Not on file  Stress:   . Feeling of Stress : Not on file  Social Connections:   . Frequency of Communication with Friends and Family: Not on file  . Frequency of Social Gatherings with Friends and Family: Not on file  .  Attends Religious Services: Not on file  . Active Member of Clubs or Organizations: Not on file  . Attends Archivist Meetings: Not on file  . Marital Status: Not on file  Intimate Partner Violence:   . Fear of Current or Ex-Partner: Not on file  . Emotionally Abused: Not on file  . Physically Abused: Not on file  . Sexually Abused: Not on file     PHYSICAL EXAM  Vitals:   12/29/19 1411  BP: 116/76  Pulse: 75  Weight: 246 lb 8 oz (111.8 kg)  Height: 6' (1.829 m)    Body mass index is 33.43 kg/m.    General: The patient is well-developed and well-nourished and in no acute distress.  He is in a wheelchair.  Neck:  There are no carotid bruits.   Skin: He has mild edema at the left ankle.  Neurologic Exam  Mental status: The patient is alert and oriented x 3 at the time of the examination. The patient has apparent normal recent and remote memory. Attention is mildly reduced.   Speech is normal.   There is no neglect.  Cranial nerves: Extraocular  movements are full.  Facial strength and sensation is normal.  Trapezius strength is normal.. No dysarthria is noted.  No obvious hearing deficits are noted.  Motor:  Muscle bulk is normal.  Increased muscle tone in the left arm and leg..   strength is 5/5 on the right. Strength is 2/5 in left shoulder elevation and 1/5 in proximal arm strength.  Strength is 2/5 in the proximal left leg and 3/5 in the quadriceps and 2/5 in the distal leg  Sensory: He has intact sensation to touch and vibration in the arms and legs  Coordination:  Finger-nose-finger and heel-to-shin is performed well on the right.  He is unable to do these on the left.    Gait and station: He needs support to stand and to take any steps  Reflexes: Deep tendon reflexes are increased on the left.Marland Kitchen      DIAGNOSTIC DATA (LABS, IMAGING, TESTING) - I reviewed patient records, labs, notes, testing and imaging myself where available.  Lab Results  Component Value Date   WBC 6.4 11/18/2019   HGB 14.7 11/18/2019   HCT 44.4 11/18/2019   MCV 90.5 11/18/2019   PLT 138.0 (L) 11/18/2019      Component Value Date/Time   NA 139 11/18/2019 1526   NA 143 06/02/2018 0000   K 4.3 11/18/2019 1526   CL 104 11/18/2019 1526   CO2 27 11/18/2019 1526   GLUCOSE 87 11/18/2019 1526   BUN 15 11/18/2019 1526   BUN 17 06/02/2018 0000   CREATININE 1.16 11/18/2019 1526   CREATININE 1.03 06/17/2019 1445   CALCIUM 8.8 11/18/2019 1526   PROT 6.7 11/18/2019 1526   PROT 6.6 06/02/2018 0000   ALBUMIN 4.0 11/18/2019 1526   ALBUMIN 3.9 06/02/2018 0000   AST 16 11/18/2019 1526   ALT 10 11/18/2019 1526   ALKPHOS 59 11/18/2019 1526   BILITOT 0.5 11/18/2019 1526   BILITOT 0.3 06/02/2018 0000   GFRNONAA 77 06/02/2018 0000   GFRNONAA >89 09/13/2014 1032   GFRAA 89 06/02/2018 0000   GFRAA >89 09/13/2014 1032   Lab Results  Component Value Date   CHOL 124 11/18/2019   HDL 42.10 11/18/2019   LDLCALC 64 11/18/2019   TRIG 85.0 11/18/2019    CHOLHDL 3 11/18/2019   Lab Results  Component Value Date   HGBA1C 5.6  02/13/2014   Lab Results  Component Value Date   VOHKGOVP03 403 11/18/2019   Lab Results  Component Value Date   TSH 1.640 06/02/2018        ASSESSMENT AND PLAN  Hemiplegia affecting left side in left-dominant patient as late effect of cerebrovascular disease ([place]) - Plan: HYDROcodone-acetaminophen (NORCO/VICODIN) 5-325 MG tablet  OSA (obstructive sleep apnea)  Chronic anticoagulation  Gait disturbance  Paresthesia    1.    Continue Eliquis for anticoagulation for stroke prophylaxis 2.    Use 4 prong cane around the house and wheelchair for longer distance.      3.    Continue midodrine 2.5 mg 2-3 times a day.  consider changing to pyridostigmine if lightheadedness worsens.  4.    Continue regular use of CPAP.   Take gabapentin with doxepin to help with insomnia.   5.     He would benefit from a nurses aid to come to the house for bathing 6.    He will return to see me in 5-6 months or sooner if he has new or worsening neurologic symptoms.   Hussien Greenblatt A. Felecia Shelling, MD, PhD 52/48/1859, 0:93 PM Certified in Neurology, Clinical Neurophysiology, Sleep Medicine, Pain Medicine and Neuroimaging  [facility] [address] [place], [place] [address] [phone]

## 2019-12-29 NOTE — Telephone Encounter (Addendum)
PA approved 12/29/19-06/28/20. PA# Carlyn Reichert of [employer] [phone]. Faxed notice of approval to [facility] at [phone]. Received fax confirmation.

## 2020-01-01 ENCOUNTER — Other Ambulatory Visit: Payer: Self-pay | Admitting: Family Medicine

## 2020-01-03 NOTE — Telephone Encounter (Signed)
Last OV 11/18/19 Last fill 06/17/19

## 2020-01-13 ENCOUNTER — Encounter: Payer: Self-pay | Admitting: Cardiology

## 2020-01-13 DIAGNOSIS — Z006 Encounter for examination for normal comparison and control in clinical research program: Secondary | ICD-10-CM

## 2020-01-13 NOTE — Progress Notes (Signed)
Physical Exam Constitutional:      Appearance: He is obese. He is ill-appearing.  HENT:     Nose: Nose normal.     Mouth/Throat:     Mouth: Mucous membranes are moist.  Eyes:     Extraocular Movements: Extraocular movements intact.  Cardiovascular:     Rate and Rhythm: Normal rate and regular rhythm.  Pulmonary:     Effort: Pulmonary effort is normal.     Breath sounds: Normal breath sounds.  Abdominal:     General: Bowel sounds are normal.     Palpations: Abdomen is soft.  Musculoskeletal:        General: Deformity (left hand deformity) present.     Cervical back: Normal range of motion.  Skin:    General: Skin is warm and dry.     Capillary Refill: Capillary refill takes less than 2 seconds.  Neurological:     Mental Status: He is alert.     Motor: Weakness (left hemiplegia) present.     Gait: Gait abnormal (wheel chair bound).  Psychiatric:        Mood and Affect: Mood normal.        Behavior: Behavior normal.      ICD-10-CM   1. Research exam: Horizon (protocol [phone])  Z00.6     Patient being followed for research.  Please see protocol below. Now randomized since 01/14/2020. Horizon (protocol [phone]) Description: A randomized double-blind, placebo-controlled, multicenter trialassessing the impact of lipoprotein(a) lowering with pelacarsen (TQJ230) on major cardiovascular events in patients with established cardiovascular disease.  No change in physical exam. Continue therapy.      Adrian Prows, MD, [facility] 01/13/2020, 4:29 PM Office: [phone]

## 2020-01-21 ENCOUNTER — Encounter: Payer: Self-pay | Admitting: Physical Therapy

## 2020-01-21 NOTE — Therapy (Signed)
[facility] [address] [place], [place], [address] Phone: [phone]   Fax:  [phone]  Patient Details  Name: ARDIT DANH MRN: [id number] Date of Birth: December 05, 1950 Referring Provider:  No ref. provider found  Encounter Date: 01/21/2020  PHYSICAL THERAPY DISCHARGE SUMMARY  Visits from Start of Care: 42  Current functional level related to goals / functional outcomes: Unable to fully assess; pt was continuing to work with therapy towards gait training and Bioness training but then following last visit pt did not schedule any further visits.  Contacted pt x 2 to schedule more visits.  Wife stated they would contact clinic to schedule more; PT then reached out when wife did not call back.  PT left a voicemail requesting pt or wife contact clinic to either schedule more visits or D/C if pt does not wish to continue with therapy.  Pt and wife did not call back; at this time PT will D/C patient.  If pt wishes to return to therapy he will require a new physician order.   Remaining deficits: Impaired strength, hypertonicity, decreased ROM, impaired balance, abnormal gait, high falls risk   Education / Equipment: HEP, AFO  Plan: Patient agrees to discharge.  Patient goals were not met. Patient is being discharged due to not returning since the last visit.  ?????     [name], PT, DPT 01/21/20    1:10 PM    [facility] [address] [place], [place], [address] Phone: [phone]   Fax:  [phone]

## 2020-01-27 ENCOUNTER — Other Ambulatory Visit: Payer: Self-pay | Admitting: Family Medicine

## 2020-01-28 ENCOUNTER — Encounter: Payer: Medicare Other | Admitting: Cardiology

## 2020-01-28 NOTE — Telephone Encounter (Signed)
Pharmacy requests refill on: Klor-Con M10  LAST REFILL: 12/08/2018 LAST OV: 11/18/2019 NEXT OV: Not Scheduled PHARMACY: [facility] [phone]

## 2020-02-08 ENCOUNTER — Other Ambulatory Visit: Payer: Self-pay

## 2020-02-08 ENCOUNTER — Ambulatory Visit (HOSPITAL_COMMUNITY): Payer: Medicare Other | Attending: Internal Medicine

## 2020-02-08 DIAGNOSIS — I25810 Atherosclerosis of coronary artery bypass graft(s) without angina pectoris: Secondary | ICD-10-CM | POA: Insufficient documentation

## 2020-02-08 DIAGNOSIS — I5042 Chronic combined systolic (congestive) and diastolic (congestive) heart failure: Secondary | ICD-10-CM | POA: Diagnosis not present

## 2020-02-08 LAB — ECHOCARDIOGRAM COMPLETE
AR max vel: 1.72 cm2
AV Area VTI: 1.87 cm2
AV Area mean vel: 1.66 cm2
AV Mean grad: 11 mmHg
AV Peak grad: 17.8 mmHg
Ao pk vel: 2.11 m/s
Area-P 1/2: 3.53 cm2
S' Lateral: 4.4 cm

## 2020-02-08 MED ORDER — PERFLUTREN LIPID MICROSPHERE
1.0000 mL | INTRAVENOUS | Status: AC | PRN
  Administered 2020-02-08: 1 mL via INTRAVENOUS

## 2020-02-17 ENCOUNTER — Other Ambulatory Visit: Payer: Self-pay | Admitting: Family Medicine

## 2020-02-17 NOTE — Telephone Encounter (Signed)
Pharmacy requests refill on: Ferrous Sulfate 325 mg   LAST REFILL: 08/17/2019 (Q-180, R-1) LAST OV: 11/18/2019 NEXT OV: Not Scheduled  PHARMACY: [id number] [place], [place]

## 2020-03-20 ENCOUNTER — Ambulatory Visit: Payer: Medicare Other

## 2020-03-22 ENCOUNTER — Ambulatory Visit: Payer: Medicare Other | Admitting: Cardiovascular Disease

## 2020-04-10 ENCOUNTER — Encounter: Payer: Medicare Other | Admitting: Cardiology

## 2020-04-17 ENCOUNTER — Encounter: Payer: Medicare Other | Admitting: Cardiology

## 2020-04-19 ENCOUNTER — Encounter: Payer: Self-pay | Admitting: Family Medicine

## 2020-04-19 DIAGNOSIS — I69952 Hemiplegia and hemiparesis following unspecified cerebrovascular disease affecting left dominant side: Secondary | ICD-10-CM

## 2020-04-19 DIAGNOSIS — Z8673 Personal history of transient ischemic attack (TIA), and cerebral infarction without residual deficits: Secondary | ICD-10-CM

## 2020-04-26 ENCOUNTER — Encounter: Payer: Self-pay | Admitting: Cardiovascular Disease

## 2020-04-26 ENCOUNTER — Telehealth (INDEPENDENT_AMBULATORY_CARE_PROVIDER_SITE_OTHER): Payer: Medicare Other | Admitting: Cardiovascular Disease

## 2020-04-26 ENCOUNTER — Telehealth: Payer: Self-pay | Admitting: Pharmacist

## 2020-04-26 VITALS — BP 112/55 | HR 61 | Temp 97.9°F | Wt 246.0 lb

## 2020-04-26 DIAGNOSIS — E785 Hyperlipidemia, unspecified: Secondary | ICD-10-CM

## 2020-04-26 DIAGNOSIS — Z951 Presence of aortocoronary bypass graft: Secondary | ICD-10-CM

## 2020-04-26 DIAGNOSIS — Z7901 Long term (current) use of anticoagulants: Secondary | ICD-10-CM

## 2020-04-26 DIAGNOSIS — I25810 Atherosclerosis of coronary artery bypass graft(s) without angina pectoris: Secondary | ICD-10-CM | POA: Diagnosis not present

## 2020-04-26 DIAGNOSIS — I63411 Cerebral infarction due to embolism of right middle cerebral artery: Secondary | ICD-10-CM

## 2020-04-26 DIAGNOSIS — G4733 Obstructive sleep apnea (adult) (pediatric): Secondary | ICD-10-CM | POA: Diagnosis not present

## 2020-04-26 DIAGNOSIS — I951 Orthostatic hypotension: Secondary | ICD-10-CM

## 2020-04-26 DIAGNOSIS — I255 Ischemic cardiomyopathy: Secondary | ICD-10-CM

## 2020-04-26 MED ORDER — MIDODRINE HCL 2.5 MG PO TABS: 2.5000 mg | ORAL_TABLET | Freq: Three times a day (TID) | ORAL | 3 refills | Status: DC

## 2020-04-26 NOTE — Telephone Encounter (Signed)
Prior authorization for continuation of Eliquis has been submitted.

## 2020-04-26 NOTE — Patient Instructions (Signed)
Medication Instructions:  INCREASE midodrine to 2.5mg  three times daily CONTINUE all other current medications  We have reached out to our pharmacy team regarding Eliquis coverage  *If you need a refill on your cardiac medications before your next appointment, please call your pharmacy*   Follow-Up: At [facility], you and your health needs are our priority.  As part of our continuing mission to provide you with exceptional heart care, we have created designated Provider Care Teams.  These Care Teams include your primary Cardiologist (physician) and Advanced Practice Providers (APPs -  Physician Assistants and Nurse Practitioners) who all work together to provide you with the care you need, when you need it.  We recommend signing up for the patient portal called "MyChart".  Sign up information is provided on this After Visit Summary.  MyChart is used to connect with patients for Virtual Visits (Telemedicine).  Patients are able to view lab/test results, encounter notes, upcoming appointments, etc.  Non-urgent messages can be sent to your provider as well.   To learn more about what you can do with MyChart, go to [email or web address].    Your next appointment:   6 month(s)  The format for your next appointment:   In Person  Provider:   Dr. Shelva Majestic   Other Instructions

## 2020-04-26 NOTE — Telephone Encounter (Signed)
-----   Message from Fidel Levy, RN sent at 04/26/2020  9:48 AM EST ----- Regarding: eliquis not covered Patient's insurance [employer] will not cover Eliquis. He said only Xarelto and Warfarin. He reports he had pulmonary emboli while on Xarelto in the past  Please advise

## 2020-04-26 NOTE — Progress Notes (Signed)
Virtual Visit via Video Note   This visit type was conducted due to national recommendations for restrictions regarding the COVID-19 Pandemic (e.g. social distancing) in an effort to limit this patient's exposure and mitigate transmission in our community.  Due to his co-morbid illnesses, this patient is at least at moderate risk for complications without adequate follow up.  This format is felt to be most appropriate for this patient at this time.  All issues noted in this document were discussed and addressed.  A limited physical exam was performed with this format.  Please refer to the patient's chart for his consent to telehealth for [facility].       Date:  04/26/2020   ID:  Gregory Wiley, DOB [birth year], MRN [id number] The patient was identified using 2 identifiers.  Patient Location: Home Provider Location: Home Office  PCP:  Tonia Ghent, MD  Cardiologist:  Shelva Majestic, MD Electrophysiologist:  None   Evaluation Performed:  Follow-Up Visit  Chief Complaint: 9 month F/U  History of Present Illness:    Gregory Wiley is a 70 y.o. male with established CAD who underwent stenting of his RCA in May 2011. He is s/p CABG surgery with LIMA to the LAD, sequential vein to the distal RCA and PDA, and vein to the acute marginal branch of his right coronary artery. In April 2013 he developed unstable angina and ruled in for non-ST segment elevation myocardial infarction. Catheterization revealed 99% stenosis of the large left circumflex vessel and he underwent successful stenting with insertion of a 4.0x18 mm resolute DES stent post dilated to 4.5 mm.  Additional problems include obstructive sleep apnea. On his initial polysomnogram in 2010 AHI was 11.3 overall, but during REM sleep he had moderate sleep apnea with an AHI of 24.8. He never did initiate CPAP therapy at that time. However, since his myocardial infarction he has been using CPAP therapy since July 2013. When I last saw  him, he was only utilizing CPAP intermittently. He now admits to much more frequent use but there are times when he does not use treatment.  He has a history of hyperlipidemia, hypertension, and morbid obesity. An echo Doppler study in May 2013 suggested an ejection fraction of 38% and on nuclear imaging was 37% with scar in the LAD territory.  He was admitted to [facility] hospital in November 2015 with a large right MCA territory CVA. TEE was negative for embolic source. EF was 45% on echocardiogram. He underwent implantable loop recorder by Dr. Rayann Heman. He was readmitted with bilateral submassive pulmonary embolism. Venous Dopplers were positive for DVT. Ejection fraction was 25-30%, which is felt to be contributed by strain from his pulmonary emboli. He had mild troponin elevation which was felt to be due to demand ischemia and has been on Xarelto for anticoagulation.  He was admitted on the [facility] hospitalist service on 11/11/2014 with dehydration. Several of his medications were held. I reviewed his hospital record. He had a history of nausea and vomiting that had been going on for approximatey a month. He has continued to feel weak and in his words, "feels terrible" since his hospitalization. He has had issues with headaches which he did see neurology. He has not been eating well. He seeing Dr. Damita Dunnings at [facility] for primary care. He denies chest pressure. He denies fevers. He is on Xarelto anticoagulation and denies overt bleeding. There is a remote history of erosive gastroduodenitis, GERD, esophageal stricture for which in the past  he had been seen by Dr. Fuller Plan.  When I previously saw him, he was tachycardic which I felt was contributed by his Ritalin. He has since been off this medication.   In November 2017, he had removal of his loop recorder by Dr. Rayann Heman. This had been initially placed at that in 2015 following his large right middle cerebral artery stroke. Over the  last several years he admits to a purposeful 170 pound weight loss with a peak weight at 346 down to 193. His initial sleep study was in September 2010. When I last saw him in September 2018. He complained of significant fatigability. An Epworth Sleepiness Scale score was calculated and this endorsed at 15 which is consistent with excessive daytime sleepiness. He also has noticed lower extremity edema.   He underwent a sleep study on 12/14/2016. He was found to have severe sleep apnea with an AHI of 36.1 and RDI 40.4. CPAP was started at 5 cm and was titrated up to 12 cm. Oxygen desaturation was 87%. He was unable to achieve any REM sleep on the diagnostic portion of the study. He was set up with CPAP on 02/28/2017. DME's [employer]. Hollace Kinnier was obtained from January 24 through separate 22 2019. He is compliant with 97% of usage stays and 87% of usage greater than 4 hours. He is averaging 7 hours and 33 minutes of sleep per night. At 11 cm water pressure. AHI is 1.2. He does have moderate leak almost on a daily basis. He has been using nasal pillows, but upon further questioning, there is significant oral venting and mouth breathing. Epworth Sleepiness Scale score endorsed at 17 today, which is compatible with residual daytime sleepiness.   I saw him in February 2019. At that time, I reviewed his most recent sleep study in detail. Over the past year, he has been followed by Dr. [name]. He had issues with iron deficiency anemia. He denies any episodes of chest pain. He continues to use CPAP with excellent compliance. He admits to a 40 pound weight gain over the past year. Not been very active and has been eating more. Download was obtained in the office today of his CPAP unit from April 25, 2018 through May 24, 2018. Usage days was 97% with usage greater than 4 hours 90%. Average usage was 7 hours and 53 minutes. At a 11 cm set pressure, AHI is excellent at  1.6. There are several days of increased leak of his mask. [employer] is his DME company.  During that evaluation,I recommended that he undergo a 5-year follow-up nuclear perfusion study for reassessment of his coronary perfusion. In addition, repeat laboratory was recommended on his current medical regimen.  He underwent his nuclear perfusion study on May 29, 2018. This revealed it is for prior left scar most likely involving the circumflex territory soon with his prior MI without associated ischemia. Nuclear EF was 34%.  He was  evaluated in a telemedicine visiton June1,2020. He deniedany recurrent chest pain or anginal type symptoms. Hewas usingCPAP withexcellent compliance. A new download was obtained in the office from March 21 through July 05, 2018 which verified compliance with97% of usage days;averaging 7 hours and 53 minutes of CPAP use. At 11 cm water pressure AHI was excellent at 1.6. Apparently his machinewasmalfunctioning and is not able to generate data to allow additional reporting. His DME company is [employer]. He typically goes to bed between 9 and 10 PM but often times may  wake up at 330 to go to the bathroom. Rather than going back to sleep he oftenwas going to a chair andto [place]. He admitted to daytime sleepiness. During that evaluation I stressed the importance of using CPAP throughout the nights entirety since the second half of the night he often was not using treatment. I discussed the preponderance of REM sleep occurs in the second half of the night and if he has reduced sleep duration this may be a contributor to his residual daytime sleepiness. He continues to be on Nuvigil.  I saw him in November 2020.  He received a new ResMed air sense 10 CPAP machine several months ago. A new download was obtained in the office today from October 25 through February 08, 2019. He is 100% compliant and is now back at using  CPAP for 7 hours per night. At 11 cm water pressure, AHI is 92.3/h. He does have occasional mask leak.   He remains debilitated from his prior stroke and does admit to some dizziness with walking or standing. In the past, I had started him on midodrine 2.5 mg twice a day for orthostatic symptomatology. Apparently, when he he was seen by neurology, he was advised to increase that to 5 mg twice a day. The patient states that with this increase his blood pressure became significantly elevated and therefore he stopped the medication.   I last evaluated him in a telemedicine visit on Aug 12, 2019 and since his prior evaluation he  remained incapacitated from his prior stroke.  He is sitting down most of his time.  2 times per week he has physical therapy.  He has difficulty with foot drop from the stroke.  He believes most of the time he feels that he is in "brain fog."  He has continued to use CPAP.  I obtained a new download from April 27 to Aug 11, 2019 which confirms 100% compliance.  Average usage is 7 hours and 32 minutes at 11 cm water pressure AHI is excellent at 1.9.  Presently he denies chest pain or shortness of breath.  He is trying to do some exercise with supine bicycle.  His last echo Doppler study was in 2017 which showed an EF of 40% with grade 2 diastolic dysfunction.  I recommended that he have a follow-up echo Doppler study which was done on February 08, 2020 which showed an EF of 50% and grade 1 diastolic dysfunction.  He had mild aortic valve stenosis with a mean gradient of 11 and mild to moderate aortic insufficiency.  There was mild dilation of his aortic root and ascending aorta at 42 mm.  Presently, Mr. Luciano Cutter continues to have issues with orthostatic symptoms.  He predominantly spends most of the day in a chair but every time he gets up to walk to the bathroom he does experience dizziness and lightheadedness.  He has been on midodrine 2.5 mg twice a day.  He denies any recurrent  anginal symptomatology.  He denies any significant shortness of breath.  He continues to use CPAP with 100% compliance.  I obtained a new download today from January 10 through April 24, 2020 which confirms excellent compliance at 100% with average use of 6 hours and 26 minutes per night.  At 11 cm water pressure, AHI is 1.5/h.  He sees Dr. Patric Dykes for neurology follow-up in his primary care Dr. Damita Dunnings who will be checking laboratory next week.  He presents for evaluation.  The patient does not have  symptoms concerning for COVID-19 infection (fever, chills, cough, or new shortness of breath).    Past Medical History:  Diagnosis Date  . Adrenal insufficiency ([place])   . Anxiety   . Basal cell carcinoma of skin of left ankle   . Bleeding stomach ulcer 2015  . Coronary artery disease    stent, then CABG 07/20/10  . Daily headache    "for the past month" (11/11/2014)  . Depression   . Diverticulosis   . GERD (gastroesophageal reflux disease)   . Gout   . H/O cardiomyopathy    ischemic, now last echo 07/30/11, EF 38%W  . History of hiatal hernia   . Hyperlipidemia   . Hypertension   . Internal hemorrhoids   . Left hemiplegia ([place])   . NSTEMI (non-ST elevated myocardial infarction) ([place])    NSTEMI- last cath 06/2011-Stent to LCX-DES, last nuc 07/30/11 low risk  . Obesity (BMI 30-39.9)   . OSA on CPAP    since July 2013- uses CPAP sometimes , states he has lost 147 lbs. since stroke & doesn't use the CPAP as much as he use to.   . Plantar fasciitis of left foot   . Pneumonia    hosp. 12/2014  . Pulmonary embolism ([place]) 03/2014  . Stroke ([place]) 01/2014   "no control of LUE, can slightly LLE; memory problems since" (11/11/2014)  . Tubular adenoma of colon    Past Surgical History:  Procedure Laterality Date  . BASAL CELL CARCINOMA EXCISION Left 2015   ankle  . CARDIAC SURGERY    . CATARACT EXTRACTION Bilateral   . COLONOSCOPY N/A 02/10/2013   Procedure: COLONOSCOPY;  Surgeon: Ladene Artist, MD;  Location: [facility] ENDOS[facility];  Service: Endoscopy;  Laterality: N/A;  . CORONARY ANGIOPLASTY WITH STENT PLACEMENT  07/01/2011   DES-Resolute to native LCX  . CORONARY ANGIOPLASTY WITH STENT PLACEMENT  12/01/2007   COMPLEX 5 LESION PCI INCLUDING CUTTING BALLOON AND 4 CYPHER DESs  . CORONARY ARTERY BYPASS GRAFT  07/20/2010    LIMA-LAD; VG-ACUTE MARG of RCA; Seq VG-distal RCA & then pda  . EP IMPLANTABLE DEVICE N/A 02/14/2016   Procedure: Loop Recorder Removal;  Surgeon: Thompson Grayer, MD;  Location: [facility] CV LAB;  Service: Cardiovascular;  Laterality: N/A;  . ESOPHAGOGASTRODUODENOSCOPY  01/2014   gastric ulcer, erosive gastroduodenitis, H Pylori negative.   . ESOPHAGOGASTRODUODENOSCOPY (EGD) WITH PROPOFOL N/A 11/14/2014   Procedure: ESOPHAGOGASTRODUODENOSCOPY (EGD) WITH PROPOFOL;  Surgeon: Milus Banister, MD;  Location: [facility];  Service: Endoscopy;  Laterality: N/A;  . HAND SURGERY Right    to take glass out  . HERNIA REPAIR    . INTRAMEDULLARY (IM) NAIL INTERTROCHANTERIC Right 02/07/2015   Procedure: INTRAMEDULLARY (IM) NAIL INTERTROCHANTRIC RIGHT HIP;  Surgeon: Renette Butters, MD;  Location: [place];  Service: Orthopedics;  Laterality: Right;  . KNEE ARTHROSCOPY Right   . LEFT HEART CATHETERIZATION WITH CORONARY/GRAFT ANGIOGRAM  07/01/2011   Procedure: LEFT HEART CATHETERIZATION WITH Beatrix Fetters;  Surgeon: Sanda Klein, MD;  Location: [facility] CATH LAB;  Service: Cardiovascular;;  . LOOP RECORDER IMPLANT  02/15/2014   MDT LINQ implanted by Dr Rayann Heman for cryptogenic stroke  . PERCUTANEOUS CORONARY STENT INTERVENTION (PCI-S) Right 07/01/2011   Procedure: PERCUTANEOUS CORONARY STENT INTERVENTION (PCI-S);  Surgeon: Sanda Klein, MD;  Location: [facility] CATH LAB;  Service: Cardiovascular;  Laterality: Right;  . PICC LINE PLACE PERIPHERAL ([place] HX)  06/2015  . RETINAL DETACHMENT SURGERY Right   . TEE WITHOUT CARDIOVERSION N/A 02/15/2014  Procedure: TRANSESOPHAGEAL ECHOCARDIOGRAM  (TEE);  Surgeon: Josue Hector, MD;  Location: [facility];  Service: Cardiovascular;  Laterality: N/A;  . UMBILICAL HERNIA REPAIR  2006     Current Meds  Medication Sig  . acetaminophen (TYLENOL) 325 MG tablet Take 1 tablet (325 mg total) by mouth every 6 (six) hours as needed for mild pain.  . Armodafinil 200 MG TABS TAKE 1 TABLET BY MOUTH EVERY DAY IN THE MORNING  . cholecalciferol (VITAMIN D) 1000 units tablet Take 1,000 Units by mouth daily.  . CVS MELATONIN 10 MG CAPS TAKE 1 CAPSULE BY MOUTH AT BEDTIME AS NEEDED FOR SLEEP  . doxepin (SINEQUAN) 10 MG capsule TAKE 1 CAPSULE AT BEDTIME  . ELDERBERRY PO Take by mouth. 3 a day  . ELIQUIS 5 MG TABS tablet TAKE 1 TABLET BY MOUTH TWICE A DAY  . ferrous sulfate 325 (65 FE) MG tablet TAKE 1 TABLET TWICE A DAY WITH FOOD  . gabapentin (NEURONTIN) 300 MG capsule Take one or two at bedtime  . HYDROcodone-acetaminophen (NORCO/VICODIN) 5-325 MG tablet Take 1 tablet by mouth daily as needed for moderate pain.  . hydrocortisone (ANUSOL-HC) 25 MG suppository Place 1 suppository (25 mg total) rectally 2 (two) times daily as needed. X 1 week; then, use as needed thereafter.  Javier Docker Oil 1000 MG CAPS Take by mouth daily.  . lansoprazole (PREVACID) 30 MG capsule Take 1 capsule (30 mg total) by mouth 2 (two) times daily before a meal.  . nitroGLYCERIN (NITROSTAT) 0.4 MG SL tablet Place 1 tablet (0.4 mg total) under the tongue every 5 (five) minutes as needed for chest pain.  . NON FORMULARY Spirulina  3 capsules once daily  . NONFORMULARY OR COMPOUNDED ITEM Supplement menmoringa oleifera taken daily.  Marland Kitchen nystatin (MYCOSTATIN/NYSTOP) powder Apply topically 2 (two) times daily.  Marland Kitchen OVER THE COUNTER MEDICATION Ceylon Cinnamon- takes 2 a day, 1200 mg  . potassium chloride (KLOR-CON M10) 10 MEQ tablet TAKE 1 TABLETS BY MOUTH 2 TIMES DAILY.  Marland Kitchen PRESCRIPTION MEDICATION Inhale into the lungs at bedtime. CPAP  . prochlorperazine (COMPAZINE) 10 MG tablet Take 1 tablet  (10 mg total) by mouth every 8 (eight) hours as needed (headache, nausea or vomiting).  . QC LO-DOSE ASPIRIN 81 MG EC tablet TAKE 1 TABLET (81 MG TOTAL) BY MOUTH DAILY. SWALLOW WHOLE.  . QC STOOL SOFTENER PLS LAXATIVE 8.6-50 MG tablet TAKE 1 TABLET BY MOUTH 2 TIMES DAILY.  . rosuvastatin (CRESTOR) 40 MG tablet TAKE 1 TABLET BY MOUTH EVERY DAY  . senna (SENOKOT) 8.6 MG tablet Take 1 tablet by mouth 2 (two) times daily.  Marland Kitchen tiZANidine (ZANAFLEX) 4 MG capsule Take 1 capsule (4 mg total) by mouth as needed.  . [DISCONTINUED] midodrine (PROAMATINE) 2.5 MG tablet Take 1 tablet (2.5 mg total) by mouth 2 (two) times daily with a meal.     Allergies:   Diphenhydramine hcl, Adhesive [tape], Proscar [finasteride], and Tamsulosin   Social History   Tobacco Use  . Smoking status: Former Smoker    Packs/day: 2.00    Years: 4.00    Pack years: 8.00    Types: Cigarettes    Quit date: 01/30/1969    Years since quitting: 51.2  . Smokeless tobacco: Never Used  . Tobacco comment: "quit smoking cigarettes  in the 1970's"  Substance Use Topics  . Alcohol use: No    Alcohol/week: 0.0 standard drinks  . Drug use: No    Comment: "smoked some pot in college"  Family Hx: The patient's family history includes Brain cancer in his maternal grandmother; CAD (age of onset: 55) in his father; COPD in his paternal grandfather; Coronary artery disease in an other family member; Diabetes in his brother; Diabetes type II in his mother; Heart attack (age of onset: 63) in his father; Hyperlipidemia in his brother; Hypertension in his brother; Other in an other family member. There is no history of Colon cancer, Stroke, or Prostate cancer.  ROS:   Please see the history of present illness.    No fevers chills night sweats Spends most of the day in a chair No anginal symptoms No exertional dyspnea No awareness of palpitations Continued orthostatic symptoms with walking and standing Foot drop Sleeping with CPAP  with 1/2% compliance  All other systems reviewed and are negative.   Prior CV studies:   The following studies were reviewed today:   ECHO 02/08/2020 IMPRESSIONS  1. Left ventricular ejection fraction, by estimation, is 50%. The left  ventricle has low normal function. The left ventricle demonstrates  regional wall motion abnormalities (see scoring diagram/findings for  description). Left ventricular diastolic  parameters are consistent with Grade I diastolic dysfunction (impaired  relaxation).  2. Right ventricular systolic function is mildly reduced. The right  ventricular size is normal. Tricuspid regurgitation signal is inadequate  for assessing PA pressure.  3. The mitral valve is grossly normal. Trivial mitral valve  regurgitation. No evidence of mitral stenosis.  4. The aortic valve is abnormal. There is moderate calcification of the  aortic valve. Aortic valve regurgitation is mild to moderate. Mild aortic  valve stenosis. Aortic valve mean gradient measures 11.0 mmHg.  5. Aortic dilatation noted. There is mild dilatation of the aortic root  and of the ascending aorta, measuring 42 mm.   Labs/Other Tests and Data Reviewed:    EKG: I could not obtain an EKG today.  However I personally reviewed his ECG dated February 10, 2019 which showed sinus rhythm at 65 bpm, left axis deviation, LVH with repolarization changes, mild RV conduction delay and anterior Q waves V1 and V3 through 6.  March 2020ECG (independently read by me): Normal sinus rhythm at 64 bpm. Left axis deviation. LVH with repolarization changes. QS complex V1 through V4 QTc interval 468 ms.  12/04/2016 ECG (independently read by me): Normal sinus rhythm at 74 bpm. QRS complex V1 through V3. Previously noted lateral ST changes. Normal intervals.  March 2017 ECG (independently read by me): Normal sinus rhythm at 61 bpm. Poor R wave progression. Lateral ST segment changes. QTc interval 469  ms  September 2016 ECG (independently read by me): Sinus tachycardia 1 18 bpm. Left axis deviation. Cannot rule out old anterolateral infarct.  ECG (independently read by me): Normal sinus rhythm at 93 bpm. QS complex anteriorly compatible with old anterior MI  April 2016 ECG (independently read by me): Normal sinus rhythm at 79 bpm. Old anteroseptal MI with QS complex V1 through V3. LVH by voltage criteria in aVL. Increased QTc interval 488 ms.  Recent Labs: 11/18/2019: ALT 10; BUN 15; Creatinine, Ser 1.16; Hemoglobin 14.7; Platelets 138.0; Potassium 4.3; Sodium 139   Recent Lipid Panel Lab Results  Component Value Date/Time   CHOL 124 11/18/2019 03:26 PM   CHOL 134 06/02/2018 12:00 AM   CHOL 199 01/04/2013 12:06 PM   TRIG 85.0 11/18/2019 03:26 PM   TRIG 113 01/04/2013 12:06 PM   HDL 42.10 11/18/2019 03:26 PM   HDL 57 06/02/2018 12:00 AM  HDL 47 01/04/2013 12:06 PM   CHOLHDL 3 11/18/2019 03:26 PM   LDLCALC 64 11/18/2019 03:26 PM   LDLCALC 72 06/17/2019 02:45 PM   LDLCALC 69 10/10/2017 12:00 AM    Wt Readings from Last 3 Encounters:  04/26/20 246 lb (111.6 kg)  12/29/19 246 lb 8 oz (111.8 kg)  11/18/19 253 lb 7 oz (115 kg)      Objective:    Vital Signs:  BP (!) 112/55   Pulse 61   Temp 97.9 F (36.6 C)   Wt 246 lb (111.6 kg)   BMI 33.36 kg/m    Since this was a video visit I could not physically examine the patient. However, visually he appeared unchanged since his last visit. Full beard Thick neck No chest wall or abdominal pain No awareness of palpitations   ASSESSMENT & PLAN:    1. CAD: He underwent initial stenting of his RCA in May 2011 and in May 2012 underwent CABG revascularization.  In April 2013 he underwent DES stenting to his left circumflex vessel.  Subsequent nuclear study showed EF 37%.  His most recent echo Doppler study from November 2021 shows an EF of 50%. 2. Mild aortic stenosis: Most recent echo November 2021 showed a mean  gradient of 11.  There was mild to moderate aortic regurgitation.  Mild dilation of his ascending aorta and aortic root. 3. CVA: Large right middle cerebral vascular accident with residual foot drop and debility. 4. Orthostatic symptoms: Since this was a virtual visit I could not perform orthostatic blood pressure checks today.  However, he has been on midodrine 2.5 mg twice a day and still has symptoms.  I have suggested he increase this to 2.5 mg 3 times a day to see if this improves his symptoms.  Support stockings have been recommended. 5. OSA: He continues to use CPAP with 100% compliance.  I reviewed his download in detail.  AHI 1.5/h at 11 cm water pressure. 6. Hyperlipidemia: Currently on rosuvastatin 40 mg.  LDL cholesterol 72 in April 2021.  He will be having laboratory rechecked by his primary physician. 7. History of PAF: He has been on chronic anticoagulation.  Remotely, he states he developed pulmonary emboli while on Xarelto.  He has been on Eliquis.  He has been informed by his insurance company not healthcare that Eliquis will no longer be covered and he will need to transition to [place].  Will discuss with pharmacIst to see if he can be given extension to continue Eliquis    COVID-19 Education: The signs and symptoms of COVID-19 were discussed with the patient and how to seek care for testing (follow up with PCP or arrange E-visit).  The importance of social distancing was discussed today.  Time:   Today, I have spent 25 minutes with the patient with telehealth technology discussing the above problems.     Medication Adjustments/Labs and Tests Ordered: Current medicines are reviewed at length with the patient today.  Concerns regarding medicines are outlined above.   Tests Ordered: No orders of the defined types were placed in this encounter.   Medication Changes: Meds ordered this encounter  Medications  . midodrine (PROAMATINE) 2.5 MG tablet    Sig: Take 1 tablet (2.5  mg total) by mouth 3 (three) times daily with meals.    Dispense:  270 tablet    Refill:  3    Follow Up: 65-month follow-up evaluation  Signed, Shelva Majestic, MD  04/26/2020 12:34 PM  [facility]

## 2020-04-27 ENCOUNTER — Telehealth: Payer: Self-pay | Admitting: Cardiovascular Disease

## 2020-04-27 NOTE — Telephone Encounter (Signed)
New Message:    Pt had an appt with Dr Claiborne Billings yesterday(04-26-20). Please call wife about his Midodrine. She said pt will not take it 3 times a day.

## 2020-04-27 NOTE — Telephone Encounter (Addendum)
Prior auth approved through 04/28/21. Also activated $10 copay card for pt and called info into pharmacy, [facility]. He is not due for a refill until next month. Called pt to make him aware, he was very appreciative for assistance.

## 2020-04-27 NOTE — Telephone Encounter (Signed)
Called patient's wife (DPR) about message. She stated patient will not take medication 3 times a day. Patient is also refusing to wear compression stockings. Patient's wife stated patient has a hard time remembering to take the pill. Encouraged patient's wife to help with patient taking medication and being more compliant. Patient's wife stated she could give patient 1 1/2 tablets twice a day instead. Patient's wife stated this would be easier for her. Informed her that midodrine is suppose to be taken usually 3 times a day to help keep patient from having symptoms of low BP through out the day, not just in the morning and evening. Informed patient's wife that she might need to get more involved in helping patient with his medications, instead of just telling him to take his medications, give him the medication to the patient. Will forward to Dr. Claiborne Billings for further advisement.

## 2020-04-28 ENCOUNTER — Inpatient Hospital Stay (HOSPITAL_COMMUNITY)
Admission: EM | Admit: 2020-04-28 | Discharge: 2020-05-03 | DRG: 659 | Disposition: A | Discharge status: Skilled Nursing Facility | Payer: Medicare Other | Attending: Internal Medicine | Admitting: Internal Medicine

## 2020-04-28 ENCOUNTER — Telehealth: Payer: Self-pay

## 2020-04-28 ENCOUNTER — Other Ambulatory Visit: Payer: Self-pay

## 2020-04-28 DIAGNOSIS — I2510 Atherosclerotic heart disease of native coronary artery without angina pectoris: Secondary | ICD-10-CM | POA: Diagnosis present

## 2020-04-28 DIAGNOSIS — K922 Gastrointestinal hemorrhage, unspecified: Secondary | ICD-10-CM

## 2020-04-28 DIAGNOSIS — D696 Thrombocytopenia, unspecified: Secondary | ICD-10-CM | POA: Diagnosis not present

## 2020-04-28 DIAGNOSIS — N200 Calculus of kidney: Secondary | ICD-10-CM | POA: Diagnosis not present

## 2020-04-28 DIAGNOSIS — M109 Gout, unspecified: Secondary | ICD-10-CM | POA: Diagnosis present

## 2020-04-28 DIAGNOSIS — I9589 Other hypotension: Secondary | ICD-10-CM | POA: Diagnosis present

## 2020-04-28 DIAGNOSIS — N132 Hydronephrosis with renal and ureteral calculous obstruction: Secondary | ICD-10-CM

## 2020-04-28 DIAGNOSIS — R310 Gross hematuria: Secondary | ICD-10-CM | POA: Diagnosis present

## 2020-04-28 DIAGNOSIS — N202 Calculus of kidney with calculus of ureter: Secondary | ICD-10-CM | POA: Diagnosis present

## 2020-04-28 DIAGNOSIS — G4733 Obstructive sleep apnea (adult) (pediatric): Secondary | ICD-10-CM | POA: Diagnosis present

## 2020-04-28 DIAGNOSIS — K219 Gastro-esophageal reflux disease without esophagitis: Secondary | ICD-10-CM | POA: Diagnosis present

## 2020-04-28 DIAGNOSIS — I429 Cardiomyopathy, unspecified: Secondary | ICD-10-CM | POA: Diagnosis present

## 2020-04-28 DIAGNOSIS — K5900 Constipation, unspecified: Secondary | ICD-10-CM | POA: Diagnosis present

## 2020-04-28 DIAGNOSIS — R202 Paresthesia of skin: Secondary | ICD-10-CM

## 2020-04-28 DIAGNOSIS — E2740 Unspecified adrenocortical insufficiency: Secondary | ICD-10-CM | POA: Diagnosis present

## 2020-04-28 DIAGNOSIS — I252 Old myocardial infarction: Secondary | ICD-10-CM

## 2020-04-28 DIAGNOSIS — Z7901 Long term (current) use of anticoagulants: Secondary | ICD-10-CM

## 2020-04-28 DIAGNOSIS — I69952 Hemiplegia and hemiparesis following unspecified cerebrovascular disease affecting left dominant side: Secondary | ICD-10-CM

## 2020-04-28 DIAGNOSIS — Z8249 Family history of ischemic heart disease and other diseases of the circulatory system: Secondary | ICD-10-CM

## 2020-04-28 DIAGNOSIS — Z66 Do not resuscitate: Secondary | ICD-10-CM | POA: Diagnosis not present

## 2020-04-28 DIAGNOSIS — D62 Acute posthemorrhagic anemia: Secondary | ICD-10-CM | POA: Diagnosis present

## 2020-04-28 DIAGNOSIS — E785 Hyperlipidemia, unspecified: Secondary | ICD-10-CM | POA: Diagnosis present

## 2020-04-28 DIAGNOSIS — K449 Diaphragmatic hernia without obstruction or gangrene: Secondary | ICD-10-CM | POA: Diagnosis present

## 2020-04-28 DIAGNOSIS — Z79899 Other long term (current) drug therapy: Secondary | ICD-10-CM

## 2020-04-28 DIAGNOSIS — Z83438 Family history of other disorder of lipoprotein metabolism and other lipidemia: Secondary | ICD-10-CM

## 2020-04-28 DIAGNOSIS — Z87891 Personal history of nicotine dependence: Secondary | ICD-10-CM

## 2020-04-28 DIAGNOSIS — I69354 Hemiplegia and hemiparesis following cerebral infarction affecting left non-dominant side: Secondary | ICD-10-CM

## 2020-04-28 DIAGNOSIS — K921 Melena: Secondary | ICD-10-CM | POA: Diagnosis present

## 2020-04-28 DIAGNOSIS — R319 Hematuria, unspecified: Secondary | ICD-10-CM

## 2020-04-28 DIAGNOSIS — I110 Hypertensive heart disease with heart failure: Secondary | ICD-10-CM | POA: Diagnosis present

## 2020-04-28 DIAGNOSIS — N136 Pyonephrosis: Secondary | ICD-10-CM | POA: Diagnosis not present

## 2020-04-28 DIAGNOSIS — E669 Obesity, unspecified: Secondary | ICD-10-CM | POA: Diagnosis present

## 2020-04-28 DIAGNOSIS — Z888 Allergy status to other drugs, medicaments and biological substances status: Secondary | ICD-10-CM

## 2020-04-28 DIAGNOSIS — Z7401 Bed confinement status: Secondary | ICD-10-CM

## 2020-04-28 DIAGNOSIS — I5042 Chronic combined systolic (congestive) and diastolic (congestive) heart failure: Secondary | ICD-10-CM | POA: Diagnosis present

## 2020-04-28 DIAGNOSIS — N201 Calculus of ureter: Secondary | ICD-10-CM

## 2020-04-28 DIAGNOSIS — Z91048 Other nonmedicinal substance allergy status: Secondary | ICD-10-CM

## 2020-04-28 DIAGNOSIS — N390 Urinary tract infection, site not specified: Secondary | ICD-10-CM

## 2020-04-28 DIAGNOSIS — K222 Esophageal obstruction: Secondary | ICD-10-CM | POA: Diagnosis present

## 2020-04-28 DIAGNOSIS — Z85828 Personal history of other malignant neoplasm of skin: Secondary | ICD-10-CM

## 2020-04-28 DIAGNOSIS — U071 COVID-19: Secondary | ICD-10-CM

## 2020-04-28 DIAGNOSIS — Z955 Presence of coronary angioplasty implant and graft: Secondary | ICD-10-CM

## 2020-04-28 DIAGNOSIS — Z6834 Body mass index (BMI) 34.0-34.9, adult: Secondary | ICD-10-CM

## 2020-04-28 DIAGNOSIS — Z951 Presence of aortocoronary bypass graft: Secondary | ICD-10-CM

## 2020-04-28 DIAGNOSIS — Z86711 Personal history of pulmonary embolism: Secondary | ICD-10-CM

## 2020-04-28 LAB — CBC
HCT: 38.3 % — ABNORMAL LOW (ref 39.0–52.0)
Hemoglobin: 12 g/dL — ABNORMAL LOW (ref 13.0–17.0)
MCH: 29.9 pg (ref 26.0–34.0)
MCHC: 31.3 g/dL (ref 30.0–36.0)
MCV: 95.5 fL (ref 80.0–100.0)
Platelets: 232 10*3/uL (ref 150–400)
RBC: 4.01 MIL/uL — ABNORMAL LOW (ref 4.22–5.81)
RDW: 16.2 % — ABNORMAL HIGH (ref 11.5–15.5)
WBC: 8.3 10*3/uL (ref 4.0–10.5)
nRBC: 0 % (ref 0.0–0.2)

## 2020-04-28 LAB — COMPREHENSIVE METABOLIC PANEL
ALT: 13 U/L (ref 0–44)
AST: 26 U/L (ref 15–41)
Albumin: 2.8 g/dL — ABNORMAL LOW (ref 3.5–5.0)
Alkaline Phosphatase: 64 U/L (ref 38–126)
Anion gap: 8 (ref 5–15)
BUN: 12 mg/dL (ref 8–23)
CO2: 27 mmol/L (ref 22–32)
Calcium: 9.5 mg/dL (ref 8.9–10.3)
Chloride: 106 mmol/L (ref 98–111)
Creatinine, Ser: 1.26 mg/dL — ABNORMAL HIGH (ref 0.61–1.24)
GFR, Estimated: >60 mL/min (ref >60)
Glucose, Bld: 113 mg/dL — ABNORMAL HIGH (ref 70–99)
Potassium: 3.8 mmol/L (ref 3.5–5.1)
Sodium: 141 mmol/L (ref 135–145)
Total Bilirubin: 0.8 mg/dL (ref 0.3–1.2)
Total Protein: 6.9 g/dL (ref 6.5–8.1)

## 2020-04-28 LAB — URINALYSIS, ROUTINE W REFLEX MICROSCOPIC
Glucose, UA: NEGATIVE mg/dL
Ketones, ur: NEGATIVE mg/dL
Nitrite: POSITIVE — AB
Protein, ur: >300 mg/dL — AB
Specific Gravity, Urine: >1.03 — ABNORMAL HIGH (ref 1.005–1.030)
pH: 6.5 (ref 5.0–8.0)

## 2020-04-28 LAB — URINALYSIS, MICROSCOPIC (REFLEX): RBC / HPF: >50 RBC/hpf (ref 0–5)

## 2020-04-28 LAB — TYPE AND SCREEN
ABO/RH(D): O POS
Antibody Screen: NEGATIVE

## 2020-04-28 NOTE — Telephone Encounter (Signed)
Noted. Agree.

## 2020-04-28 NOTE — ED Triage Notes (Signed)
Pt presents to ED POV. Pt c/o hematuria and dark stools since yesterday. Pt also c/o light headedness, weakness, and intermittent abd pain. Pt takes eliquis.

## 2020-04-28 NOTE — Telephone Encounter (Signed)
Gregory Wiley, PT with [facility] called the office because she was doing a PT evaluation on him and pt stated he was feeling weak, having dark red urine, and dark black hard sticky stools, and lower abdominal pain. He is not on Pepto. He is on Eliquis. With those symptoms, I advised her that he needs to go to the ER. Urgent Care is not appropriate. He needs to be evaluated. She agrees ad will let us know if he declines.

## 2020-04-29 ENCOUNTER — Inpatient Hospital Stay (HOSPITAL_COMMUNITY): Payer: Medicare Other | Admitting: Certified Registered Nurse Anesthetist

## 2020-04-29 ENCOUNTER — Encounter (HOSPITAL_COMMUNITY)
Admission: EM | Disposition: A | Discharge status: Skilled Nursing Facility | Payer: Self-pay | Source: Home / Self Care | Attending: Internal Medicine

## 2020-04-29 ENCOUNTER — Emergency Department (HOSPITAL_COMMUNITY): Payer: Medicare Other

## 2020-04-29 ENCOUNTER — Encounter (HOSPITAL_COMMUNITY): Payer: Self-pay | Admitting: Family Medicine

## 2020-04-29 ENCOUNTER — Inpatient Hospital Stay (HOSPITAL_COMMUNITY): Payer: Medicare Other

## 2020-04-29 DIAGNOSIS — Z91048 Other nonmedicinal substance allergy status: Secondary | ICD-10-CM | POA: Diagnosis not present

## 2020-04-29 DIAGNOSIS — N390 Urinary tract infection, site not specified: Secondary | ICD-10-CM

## 2020-04-29 DIAGNOSIS — Z6834 Body mass index (BMI) 34.0-34.9, adult: Secondary | ICD-10-CM | POA: Diagnosis not present

## 2020-04-29 DIAGNOSIS — I5042 Chronic combined systolic (congestive) and diastolic (congestive) heart failure: Secondary | ICD-10-CM | POA: Diagnosis present

## 2020-04-29 DIAGNOSIS — Z85828 Personal history of other malignant neoplasm of skin: Secondary | ICD-10-CM | POA: Diagnosis not present

## 2020-04-29 DIAGNOSIS — R319 Hematuria, unspecified: Secondary | ICD-10-CM

## 2020-04-29 DIAGNOSIS — K222 Esophageal obstruction: Secondary | ICD-10-CM | POA: Diagnosis not present

## 2020-04-29 DIAGNOSIS — N201 Calculus of ureter: Secondary | ICD-10-CM | POA: Insufficient documentation

## 2020-04-29 DIAGNOSIS — K921 Melena: Secondary | ICD-10-CM | POA: Diagnosis present

## 2020-04-29 DIAGNOSIS — Z951 Presence of aortocoronary bypass graft: Secondary | ICD-10-CM | POA: Diagnosis not present

## 2020-04-29 DIAGNOSIS — I69354 Hemiplegia and hemiparesis following cerebral infarction affecting left non-dominant side: Secondary | ICD-10-CM | POA: Diagnosis not present

## 2020-04-29 DIAGNOSIS — Z86711 Personal history of pulmonary embolism: Secondary | ICD-10-CM | POA: Diagnosis not present

## 2020-04-29 DIAGNOSIS — Z955 Presence of coronary angioplasty implant and graft: Secondary | ICD-10-CM | POA: Diagnosis not present

## 2020-04-29 DIAGNOSIS — D62 Acute posthemorrhagic anemia: Secondary | ICD-10-CM | POA: Diagnosis present

## 2020-04-29 DIAGNOSIS — Z888 Allergy status to other drugs, medicaments and biological substances status: Secondary | ICD-10-CM | POA: Diagnosis not present

## 2020-04-29 DIAGNOSIS — Z66 Do not resuscitate: Secondary | ICD-10-CM | POA: Diagnosis not present

## 2020-04-29 DIAGNOSIS — R609 Edema, unspecified: Secondary | ICD-10-CM | POA: Diagnosis not present

## 2020-04-29 DIAGNOSIS — K3189 Other diseases of stomach and duodenum: Secondary | ICD-10-CM | POA: Diagnosis not present

## 2020-04-29 DIAGNOSIS — N202 Calculus of kidney with calculus of ureter: Secondary | ICD-10-CM | POA: Diagnosis present

## 2020-04-29 DIAGNOSIS — G4733 Obstructive sleep apnea (adult) (pediatric): Secondary | ICD-10-CM | POA: Diagnosis present

## 2020-04-29 DIAGNOSIS — K219 Gastro-esophageal reflux disease without esophagitis: Secondary | ICD-10-CM | POA: Diagnosis present

## 2020-04-29 DIAGNOSIS — E2740 Unspecified adrenocortical insufficiency: Secondary | ICD-10-CM | POA: Diagnosis present

## 2020-04-29 DIAGNOSIS — I9589 Other hypotension: Secondary | ICD-10-CM | POA: Diagnosis present

## 2020-04-29 DIAGNOSIS — K922 Gastrointestinal hemorrhage, unspecified: Secondary | ICD-10-CM

## 2020-04-29 DIAGNOSIS — N136 Pyonephrosis: Secondary | ICD-10-CM | POA: Diagnosis present

## 2020-04-29 DIAGNOSIS — N200 Calculus of kidney: Secondary | ICD-10-CM | POA: Diagnosis present

## 2020-04-29 DIAGNOSIS — E669 Obesity, unspecified: Secondary | ICD-10-CM | POA: Diagnosis present

## 2020-04-29 DIAGNOSIS — U071 COVID-19: Secondary | ICD-10-CM | POA: Diagnosis not present

## 2020-04-29 DIAGNOSIS — I110 Hypertensive heart disease with heart failure: Secondary | ICD-10-CM | POA: Diagnosis present

## 2020-04-29 DIAGNOSIS — E785 Hyperlipidemia, unspecified: Secondary | ICD-10-CM | POA: Diagnosis present

## 2020-04-29 DIAGNOSIS — I429 Cardiomyopathy, unspecified: Secondary | ICD-10-CM | POA: Diagnosis present

## 2020-04-29 DIAGNOSIS — N132 Hydronephrosis with renal and ureteral calculous obstruction: Secondary | ICD-10-CM

## 2020-04-29 DIAGNOSIS — I2510 Atherosclerotic heart disease of native coronary artery without angina pectoris: Secondary | ICD-10-CM | POA: Diagnosis present

## 2020-04-29 DIAGNOSIS — Z7901 Long term (current) use of anticoagulants: Secondary | ICD-10-CM | POA: Diagnosis not present

## 2020-04-29 HISTORY — PX: CYSTOSCOPY W/ URETERAL STENT PLACEMENT: SHX1429

## 2020-04-29 LAB — POC OCCULT BLOOD, ED: Fecal Occult Bld: POSITIVE — AB

## 2020-04-29 LAB — COMPREHENSIVE METABOLIC PANEL
ALT: 10 U/L (ref 0–44)
AST: 28 U/L (ref 15–41)
Albumin: 2.2 g/dL — ABNORMAL LOW (ref 3.5–5.0)
Alkaline Phosphatase: 53 U/L (ref 38–126)
Anion gap: 8 (ref 5–15)
BUN: 13 mg/dL (ref 8–23)
CO2: 24 mmol/L (ref 22–32)
Calcium: 8.4 mg/dL — ABNORMAL LOW (ref 8.9–10.3)
Chloride: 107 mmol/L (ref 98–111)
Creatinine, Ser: 1.26 mg/dL — ABNORMAL HIGH (ref 0.61–1.24)
GFR, Estimated: >60 mL/min (ref >60)
Glucose, Bld: 99 mg/dL (ref 70–99)
Potassium: 3.5 mmol/L (ref 3.5–5.1)
Sodium: 139 mmol/L (ref 135–145)
Total Bilirubin: 0.8 mg/dL (ref 0.3–1.2)
Total Protein: 5.3 g/dL — ABNORMAL LOW (ref 6.5–8.1)

## 2020-04-29 LAB — RESP PANEL BY RT-PCR (FLU A&B, COVID) ARPGX2
Influenza A by PCR: NEGATIVE
Influenza B by PCR: NEGATIVE
SARS Coronavirus 2 by RT PCR: NEGATIVE

## 2020-04-29 LAB — CBC
HCT: 32.9 % — ABNORMAL LOW (ref 39.0–52.0)
Hemoglobin: 10 g/dL — ABNORMAL LOW (ref 13.0–17.0)
MCH: 29.5 pg (ref 26.0–34.0)
MCHC: 30.4 g/dL (ref 30.0–36.0)
MCV: 97.1 fL (ref 80.0–100.0)
Platelets: 156 10*3/uL (ref 150–400)
RBC: 3.39 MIL/uL — ABNORMAL LOW (ref 4.22–5.81)
RDW: 15.9 % — ABNORMAL HIGH (ref 11.5–15.5)
WBC: 7 10*3/uL (ref 4.0–10.5)
nRBC: 0 % (ref 0.0–0.2)

## 2020-04-29 LAB — SURGICAL PCR SCREEN
MRSA, PCR: NEGATIVE
Staphylococcus aureus: NEGATIVE

## 2020-04-29 LAB — CK: Total CK: 86 U/L (ref 49–397)

## 2020-04-29 LAB — HIV ANTIBODY (ROUTINE TESTING W REFLEX): HIV Screen 4th Generation wRfx: NONREACTIVE

## 2020-04-29 SURGERY — CYSTOSCOPY, WITH RETROGRADE PYELOGRAM AND URETERAL STENT INSERTION
Anesthesia: General | Laterality: Left

## 2020-04-29 MED ORDER — ACETAMINOPHEN 325 MG PO TABS
650.0000 mg | ORAL_TABLET | Freq: Four times a day (QID) | ORAL | Status: DC | PRN
  Administered 2020-04-29: 650 mg via ORAL
  Filled 2020-04-29: qty 2

## 2020-04-29 MED ORDER — CHLORHEXIDINE GLUCONATE 0.12 % MT SOLN
OROMUCOSAL | Status: AC
  Administered 2020-04-29: 15 mL via OROMUCOSAL
  Filled 2020-04-29: qty 15

## 2020-04-29 MED ORDER — FENTANYL CITRATE (PF) 250 MCG/5ML IJ SOLN
INTRAMUSCULAR | Status: AC
  Filled 2020-04-29: qty 5

## 2020-04-29 MED ORDER — COSYNTROPIN 0.25 MG IJ SOLR
0.2500 mg | Freq: Once | INTRAMUSCULAR | Status: AC
  Administered 2020-04-30: 0.25 mg via INTRAVENOUS
  Filled 2020-04-29: qty 0.25

## 2020-04-29 MED ORDER — LACTATED RINGERS IV SOLN: INTRAVENOUS | Status: DC

## 2020-04-29 MED ORDER — ONDANSETRON HCL 4 MG/2ML IJ SOLN
INTRAMUSCULAR | Status: AC
  Filled 2020-04-29: qty 2

## 2020-04-29 MED ORDER — MIDAZOLAM HCL 2 MG/2ML IJ SOLN
INTRAMUSCULAR | Status: DC | PRN
  Administered 2020-04-29: 1 mg via INTRAVENOUS

## 2020-04-29 MED ORDER — CHLORHEXIDINE GLUCONATE 0.12 % MT SOLN
15.0000 mL | Freq: Once | OROMUCOSAL | Status: AC
  Filled 2020-04-29: qty 15

## 2020-04-29 MED ORDER — DEXAMETHASONE SODIUM PHOSPHATE 10 MG/ML IJ SOLN
INTRAMUSCULAR | Status: AC
  Filled 2020-04-29: qty 1

## 2020-04-29 MED ORDER — MIDODRINE HCL 5 MG PO TABS
2.5000 mg | ORAL_TABLET | Freq: Three times a day (TID) | ORAL | Status: DC
  Administered 2020-04-29 – 2020-05-02 (×9): 2.5 mg via ORAL
  Filled 2020-04-29 (×9): qty 1

## 2020-04-29 MED ORDER — LACTATED RINGERS IV SOLN: INTRAVENOUS | Status: DC | PRN

## 2020-04-29 MED ORDER — ONDANSETRON HCL 4 MG/2ML IJ SOLN
INTRAMUSCULAR | Status: DC | PRN
  Administered 2020-04-29: 4 mg via INTRAVENOUS

## 2020-04-29 MED ORDER — ONDANSETRON HCL 4 MG/2ML IJ SOLN: 4.0000 mg | Freq: Once | INTRAMUSCULAR | Status: DC | PRN

## 2020-04-29 MED ORDER — LIDOCAINE 2% (20 MG/ML) 5 ML SYRINGE
INTRAMUSCULAR | Status: AC
  Filled 2020-04-29: qty 5

## 2020-04-29 MED ORDER — ROSUVASTATIN CALCIUM 20 MG PO TABS
40.0000 mg | ORAL_TABLET | Freq: Every day | ORAL | Status: DC
  Administered 2020-04-29 – 2020-05-03 (×5): 40 mg via ORAL
  Filled 2020-04-29: qty 8
  Filled 2020-04-29 (×4): qty 2

## 2020-04-29 MED ORDER — ROCURONIUM BROMIDE 10 MG/ML (PF) SYRINGE
PREFILLED_SYRINGE | INTRAVENOUS | Status: AC
  Filled 2020-04-29: qty 10

## 2020-04-29 MED ORDER — ACETAMINOPHEN 10 MG/ML IV SOLN: 1000.0000 mg | Freq: Once | INTRAVENOUS | Status: DC | PRN

## 2020-04-29 MED ORDER — ACETAMINOPHEN 650 MG RE SUPP: 650.0000 mg | Freq: Four times a day (QID) | RECTAL | Status: DC | PRN

## 2020-04-29 MED ORDER — EPHEDRINE SULFATE-NACL 50-0.9 MG/10ML-% IV SOSY
PREFILLED_SYRINGE | INTRAVENOUS | Status: DC | PRN
  Administered 2020-04-29 (×2): 5 mg via INTRAVENOUS

## 2020-04-29 MED ORDER — FENTANYL CITRATE (PF) 100 MCG/2ML IJ SOLN: 25.0000 ug | INTRAMUSCULAR | Status: DC | PRN

## 2020-04-29 MED ORDER — PHENYLEPHRINE 40 MCG/ML (10ML) SYRINGE FOR IV PUSH (FOR BLOOD PRESSURE SUPPORT)
PREFILLED_SYRINGE | INTRAVENOUS | Status: DC | PRN
  Administered 2020-04-29: 120 ug via INTRAVENOUS
  Administered 2020-04-29: 40 ug via INTRAVENOUS

## 2020-04-29 MED ORDER — MIDAZOLAM HCL 2 MG/2ML IJ SOLN
INTRAMUSCULAR | Status: AC
  Filled 2020-04-29: qty 2

## 2020-04-29 MED ORDER — PANTOPRAZOLE SODIUM 40 MG IV SOLR
40.0000 mg | Freq: Two times a day (BID) | INTRAVENOUS | Status: DC
  Administered 2020-04-29 – 2020-04-30 (×4): 40 mg via INTRAVENOUS
  Filled 2020-04-29 (×4): qty 40

## 2020-04-29 MED ORDER — CHLORHEXIDINE GLUCONATE CLOTH 2 % EX PADS
6.0000 | MEDICATED_PAD | Freq: Every day | CUTANEOUS | Status: DC
  Administered 2020-04-29 – 2020-05-03 (×5): 6 via TOPICAL

## 2020-04-29 MED ORDER — PROPOFOL 10 MG/ML IV BOLUS
INTRAVENOUS | Status: DC | PRN
  Administered 2020-04-29: 150 mg via INTRAVENOUS

## 2020-04-29 MED ORDER — IOHEXOL 300 MG/ML  SOLN
INTRAMUSCULAR | Status: DC | PRN
  Administered 2020-04-29: 48 mL via URETHRAL

## 2020-04-29 MED ORDER — SODIUM CHLORIDE 0.9 % IV BOLUS
1000.0000 mL | Freq: Once | INTRAVENOUS | Status: AC
  Administered 2020-04-29: 1000 mL via INTRAVENOUS

## 2020-04-29 MED ORDER — WATER FOR IRRIGATION, STERILE IR SOLN
Status: DC | PRN
  Administered 2020-04-29: 100 mL

## 2020-04-29 MED ORDER — SODIUM CHLORIDE 0.9 % IV SOLN
1.0000 g | INTRAVENOUS | Status: DC
  Administered 2020-04-29 – 2020-05-02 (×3): 1 g via INTRAVENOUS
  Filled 2020-04-29: qty 10
  Filled 2020-04-29: qty 1
  Filled 2020-04-29 (×2): qty 10

## 2020-04-29 MED ORDER — PANTOPRAZOLE SODIUM 40 MG IV SOLR: 40.0000 mg | INTRAVENOUS | Status: DC

## 2020-04-29 MED ORDER — KETOROLAC TROMETHAMINE 15 MG/ML IJ SOLN: 15.0000 mg | Freq: Once | INTRAMUSCULAR | Status: DC | PRN

## 2020-04-29 MED ORDER — PHENYLEPHRINE 40 MCG/ML (10ML) SYRINGE FOR IV PUSH (FOR BLOOD PRESSURE SUPPORT)
PREFILLED_SYRINGE | INTRAVENOUS | Status: AC
  Filled 2020-04-29: qty 10

## 2020-04-29 MED ORDER — PROPOFOL 10 MG/ML IV BOLUS
INTRAVENOUS | Status: AC
  Filled 2020-04-29: qty 20

## 2020-04-29 MED ORDER — LIDOCAINE 2% (20 MG/ML) 5 ML SYRINGE
INTRAMUSCULAR | Status: DC | PRN
  Administered 2020-04-29: 60 mg via INTRAVENOUS

## 2020-04-29 MED ORDER — SODIUM CHLORIDE 0.9 % IR SOLN
Status: DC | PRN
  Administered 2020-04-29: 3000 mL

## 2020-04-29 MED ORDER — PROMETHAZINE HCL 25 MG PO TABS: 12.5000 mg | ORAL_TABLET | Freq: Four times a day (QID) | ORAL | Status: DC | PRN

## 2020-04-29 MED ORDER — FENTANYL CITRATE (PF) 100 MCG/2ML IJ SOLN
INTRAMUSCULAR | Status: DC | PRN
  Administered 2020-04-29: 50 ug via INTRAVENOUS

## 2020-04-29 SURGICAL SUPPLY — 19 items
BAG URINE DRAIN 2000ML AR STRL (UROLOGICAL SUPPLIES) ×4 IMPLANT
BAG URO CATCHER STRL LF (MISCELLANEOUS) ×2 IMPLANT
CATH FOLEY 2WAY SLVR  5CC 16FR (CATHETERS) ×2
CATH FOLEY 2WAY SLVR 5CC 16FR (CATHETERS) ×1 IMPLANT
CATH URET 5FR 28IN OPEN ENDED (CATHETERS) ×2 IMPLANT
CNTNR URN SCR LID CUP LEK RST (MISCELLANEOUS) ×1 IMPLANT
CONT SPEC 4OZ STRL OR WHT (MISCELLANEOUS) ×2
GLOVE BIO SURGEON STRL SZ7.5 (GLOVE) ×2 IMPLANT
GOWN STRL REUS W/ TWL XL LVL3 (GOWN DISPOSABLE) ×2 IMPLANT
GOWN STRL REUS W/TWL XL LVL3 (GOWN DISPOSABLE) ×4
GUIDEWIRE STR DUAL SENSOR (WIRE) ×2 IMPLANT
GUIDEWIRE ZIPWRE .038 STRAIGHT (WIRE) ×2 IMPLANT
KIT TURNOVER KIT B (KITS) ×2 IMPLANT
MANIFOLD NEPTUNE II (INSTRUMENTS) ×2 IMPLANT
PACK CYSTO (CUSTOM PROCEDURE TRAY) ×2 IMPLANT
STENT URET 6FRX24 CONTOUR (STENTS) ×2 IMPLANT
TOWEL GREEN STERILE FF (TOWEL DISPOSABLE) ×2 IMPLANT
TUBE CONNECTING 12X1/4 (SUCTIONS) ×2 IMPLANT
WATER STERILE IRR 3000ML UROMA (IV SOLUTION) ×2 IMPLANT

## 2020-04-29 NOTE — ED Provider Notes (Signed)
[facility] EMERGENCY DEPARTMENT Provider Note   CSN: [id number] Arrival date & time: 04/28/20  1606     History Chief Complaint  Patient presents with  . Hematuria    EMIR NACK is a 70 y.o. male.  Patient is a 70 year old male with extensive past medical history including coronary artery disease with stent, prior CVA with left hemiplegia, hypertension, prior pulmonary embolism.  Patient currently taking Eliquis.  He presents today for evaluation of blood in his urine and stool.  This started earlier today.  He reports some dysuria, but denies any fevers or chills.  He describes some occasional lower abdominal discomfort and dark stools since yesterday.  The history is provided by the patient.       Past Medical History:  Diagnosis Date  . Adrenal insufficiency ([place])   . Anxiety   . Basal cell carcinoma of skin of left ankle   . Bleeding stomach ulcer 2015  . Coronary artery disease    stent, then CABG 07/20/10  . Daily headache    "for the past month" (11/11/2014)  . Depression   . Diverticulosis   . GERD (gastroesophageal reflux disease)   . Gout   . H/O cardiomyopathy    ischemic, now last echo 07/30/11, EF 38%W  . History of hiatal hernia   . Hyperlipidemia   . Hypertension   . Internal hemorrhoids   . Left hemiplegia ([place])   . NSTEMI (non-ST elevated myocardial infarction) ([place])    NSTEMI- last cath 06/2011-Stent to LCX-DES, last nuc 07/30/11 low risk  . Obesity (BMI 30-39.9)   . OSA on CPAP    since July 2013- uses CPAP sometimes , states he has lost 147 lbs. since stroke & doesn't use the CPAP as much as he use to.   . Plantar fasciitis of left foot   . Pneumonia    hosp. 12/2014  . Pulmonary embolism ([place]) 03/2014  . Stroke ([place]) 01/2014   "no control of LUE, can slightly LLE; memory problems since" (11/11/2014)  . Tubular adenoma of colon     Patient Active Problem List   Diagnosis Date Noted  . Edema 11/22/2019  . Left foot drop  07/14/2019  . GERD (gastroesophageal reflux disease) 06/18/2019  . Tinea cruris 07/20/2017  . Hemorrhoid 07/20/2017  . Irritability 06/08/2017  . Fall at home 05/06/2017  . Research exam 11/28/2016  . Skin mass 11/28/2016  . Lower urinary tract symptoms (LUTS) 09/05/2016  . Hearing loss 09/05/2016  . Excessive daytime sleepiness 04/16/2016  . Exposure to TB 02/21/2016  . Vitamin D deficiency 02/21/2016  . Anemia 11/10/2015  . Left thigh pain 11/10/2015  . Neck pain 10/12/2015  . Hemiplegia affecting left side in left-dominant patient as late effect of cerebrovascular disease ([place]) 09/03/2015  . Medicare annual wellness visit, subsequent 08/14/2015  . Acute pulmonary embolism ([place]) 07/25/2015  . Chest wall pain 07/15/2015  . History of pulmonary embolism 07/15/2015  . Chronic anticoagulation 05/22/2015  . Gait disturbance 05/15/2015  . DNR (do not resuscitate) 01/19/2015  . Hypotension 01/14/2015  . Paresthesia   . Depression 12/14/2014  . OSA (obstructive sleep apnea) 11/30/2014  . Chronic combined systolic and diastolic congestive heart failure ([place]) 11/11/2014  . SOB (shortness of breath) 04/17/2014  . Hemiplegia affecting left dominant side ([place]) 02/16/2014  . 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 06/30/2011    Past Surgical History:  Procedure  Laterality Date  . BASAL CELL CARCINOMA EXCISION Left 2015   ankle  . CARDIAC SURGERY    . CATARACT EXTRACTION Bilateral   . COLONOSCOPY N/A 02/10/2013   Procedure: COLONOSCOPY;  Surgeon: Ladene Artist, MD;  Location: [facility];  Service: Endoscopy;  Laterality: N/A;  . CORONARY ANGIOPLASTY WITH STENT PLACEMENT  07/01/2011   DES-Resolute to native LCX  . CORONARY ANGIOPLASTY WITH STENT PLACEMENT  12/01/2007   COMPLEX 5 LESION PCI INCLUDING CUTTING BALLOON AND 4 CYPHER DESs  . CORONARY ARTERY BYPASS GRAFT  07/20/2010    LIMA-LAD; VG-ACUTE MARG of RCA; Seq VG-distal  RCA & then pda  . EP IMPLANTABLE DEVICE N/A 02/14/2016   Procedure: Loop Recorder Removal;  Surgeon: Thompson Grayer, MD;  Location: [facility] CV LAB;  Service: Cardiovascular;  Laterality: N/A;  . ESOPHAGOGASTRODUODENOSCOPY  01/2014   gastric ulcer, erosive gastroduodenitis, H Pylori negative.   . ESOPHAGOGASTRODUODENOSCOPY (EGD) WITH PROPOFOL N/A 11/14/2014   Procedure: ESOPHAGOGASTRODUODENOSCOPY (EGD) WITH PROPOFOL;  Surgeon: Milus Banister, MD;  Location: [facility];  Service: Endoscopy;  Laterality: N/A;  . HAND SURGERY Right    to take glass out  . HERNIA REPAIR    . INTRAMEDULLARY (IM) NAIL INTERTROCHANTERIC Right 02/07/2015   Procedure: INTRAMEDULLARY (IM) NAIL INTERTROCHANTRIC RIGHT HIP;  Surgeon: Renette Butters, MD;  Location: [facility];  Service: Orthopedics;  Laterality: Right;  . KNEE ARTHROSCOPY Right   . LEFT HEART CATHETERIZATION WITH CORONARY/GRAFT ANGIOGRAM  07/01/2011   Procedure: LEFT HEART CATHETERIZATION WITH Beatrix Fetters;  Surgeon: Sanda Klein, MD;  Location: [facility] CATH LAB;  Service: Cardiovascular;;  . LOOP RECORDER IMPLANT  02/15/2014   MDT LINQ implanted by Dr Rayann Heman for cryptogenic stroke  . PERCUTANEOUS CORONARY STENT INTERVENTION (PCI-S) Right 07/01/2011   Procedure: PERCUTANEOUS CORONARY STENT INTERVENTION (PCI-S);  Surgeon: Sanda Klein, MD;  Location: [facility] CATH LAB;  Service: Cardiovascular;  Laterality: Right;  . PICC LINE PLACE PERIPHERAL ([place] HX)  06/2015  . RETINAL DETACHMENT SURGERY Right   . TEE WITHOUT CARDIOVERSION N/A 02/15/2014   Procedure: TRANSESOPHAGEAL ECHOCARDIOGRAM (TEE);  Surgeon: Josue Hector, MD;  Location: [facility];  Service: Cardiovascular;  Laterality: N/A;  . UMBILICAL HERNIA REPAIR  2006       Family History  Problem Relation Age of Onset  . Diabetes type II Mother   . CAD Father 42       CABG, PCI, died at 64  . Heart attack Father 52  . Hypertension Brother   . Diabetes Brother   . Hyperlipidemia Brother   .  Brain cancer Maternal Grandmother   . COPD Paternal Grandfather   . Coronary artery disease Other   . Other Other        denies family history of DVT/PE  . Colon cancer Neg Hx   . Stroke Neg Hx   . Prostate cancer Neg Hx     Social History   Tobacco Use  . Smoking status: Former Smoker    Packs/day: 2.00    Years: 4.00    Pack years: 8.00    Types: Cigarettes    Quit date: 01/30/1969    Years since quitting: 51.2  . Smokeless tobacco: Never Used  . Tobacco comment: "quit smoking cigarettes  in the 1970's"  Substance Use Topics  . Alcohol use: No    Alcohol/week: 0.0 standard drinks  . Drug use: No    Comment: "smoked some pot in college"    Home Medications Prior to Admission medications  Medication Sig Start Date End Date Taking? Authorizing Provider  acetaminophen (TYLENOL) 325 MG tablet Take 1 tablet (325 mg total) by mouth every 6 (six) hours as needed for mild pain. 12/27/14   Florencia Reasons, MD  Armodafinil 200 MG TABS TAKE 1 TABLET BY MOUTH EVERY DAY IN THE MORNING 11/29/19   Sater, Nanine Means, MD  cholecalciferol (VITAMIN D) 1000 units tablet Take 1,000 Units by mouth daily.    [provider]  [facility] MELATONIN 10 MG CAPS TAKE 1 CAPSULE BY MOUTH AT BEDTIME AS NEEDED FOR SLEEP 08/30/19   Tonia Ghent, MD  doxepin (SINEQUAN) 10 MG capsule TAKE 1 CAPSULE AT BEDTIME 11/29/19   Sater, Nanine Means, MD  ELDERBERRY PO Take by mouth. 3 a day    [provider]  ELIQUIS 5 MG TABS tablet TAKE 1 TABLET BY MOUTH TWICE A DAY 12/15/19   [name], MD  ferrous sulfate 325 (65 FE) MG tablet TAKE 1 TABLET TWICE A DAY WITH FOOD 02/17/20   Tonia Ghent, MD  gabapentin (NEURONTIN) 300 MG capsule Take one or two at bedtime 07/14/19   Sater, Nanine Means, MD  HYDROcodone-acetaminophen (NORCO/VICODIN) 5-325 MG tablet Take 1 tablet by mouth daily as needed for moderate pain. 12/29/19   Sater, Nanine Means, MD  hydrocortisone (ANUSOL-HC) 25 MG suppository Place 1 suppository (25 mg  total) rectally 2 (two) times daily as needed. X 1 week; then, use as needed thereafter. 07/07/19   Tonia Ghent, MD  Krill Oil 1000 MG CAPS Take by mouth daily.    [provider]  lansoprazole (PREVACID) 30 MG capsule Take 1 capsule (30 mg total) by mouth 2 (two) times daily before a meal. 07/04/19   Tonia Ghent, MD  midodrine (PROAMATINE) 2.5 MG tablet Take 1 tablet (2.5 mg total) by mouth 3 (three) times daily with meals. 04/26/20   [name], MD  nitroGLYCERIN (NITROSTAT) 0.4 MG SL tablet Place 1 tablet (0.4 mg total) under the tongue every 5 (five) minutes as needed for chest pain. 07/18/17   Tonia Ghent, MD  NON FORMULARY Spirulina  3 capsules once daily    [provider]  NONFORMULARY OR COMPOUNDED ITEM Supplement menmoringa oleifera taken daily.    [provider]  nystatin (MYCOSTATIN/NYSTOP) powder Apply topically 2 (two) times daily. 07/18/17   Tonia Ghent, MD  OVER THE COUNTER MEDICATION Ardath Sax- takes 2 a day, 1200 mg    [provider]  potassium chloride (KLOR-CON M10) 10 MEQ tablet TAKE 1 TABLETS BY MOUTH 2 TIMES DAILY. 01/28/20   Tonia Ghent, MD  PRESCRIPTION MEDICATION Inhale into the lungs at bedtime. CPAP    [provider]  prochlorperazine (COMPAZINE) 10 MG tablet Take 1 tablet (10 mg total) by mouth every 8 (eight) hours as needed (headache, nausea or vomiting). 03/19/16   Forde Dandy, MD  QC LO-DOSE ASPIRIN 81 MG EC tablet TAKE 1 TABLET (81 MG TOTAL) BY MOUTH DAILY. SWALLOW WHOLE. 12/11/17   [name], MD  QC STOOL SOFTENER PLS LAXATIVE 8.6-50 MG tablet TAKE 1 TABLET BY MOUTH 2 TIMES DAILY. 07/14/18   Tonia Ghent, MD  rosuvastatin (CRESTOR) 40 MG tablet TAKE 1 TABLET BY MOUTH EVERY DAY 01/03/20   Tonia Ghent, MD  senna (SENOKOT) 8.6 MG tablet Take 1 tablet by mouth 2 (two) times daily.    [provider]  tiZANidine (ZANAFLEX) 4 MG capsule Take 1 capsule (4 mg  total) by mouth as  needed. 03/06/17   Sater, Nanine Means, MD  potassium chloride (K-DUR) 10 MEQ tablet TAKE 1 TABLETS BY MOUTH 2 TIMES DAILY. 12/08/18 01/28/20  Tonia Ghent, MD    Allergies    Diphenhydramine hcl, Adhesive [tape], Proscar [finasteride], and Tamsulosin  Review of Systems   Review of Systems  All other systems reviewed and are negative.   Physical Exam Updated Vital Signs BP (!) 99/59 (BP Location: Left Arm)   Pulse 66   Temp 97.9 F (36.6 C) (Oral)   Resp (!) 23   SpO2 100%   Physical Exam Vitals and nursing note reviewed.  Constitutional:      General: He is not in acute distress.    Appearance: He is well-developed and well-nourished. He is not diaphoretic.  HENT:     Head: Normocephalic and atraumatic.     Mouth/Throat:     Mouth: Oropharynx is clear and moist.  Eyes:     Extraocular Movements: Extraocular movements intact.     Pupils: Pupils are equal, round, and reactive to light.  Cardiovascular:     Rate and Rhythm: Normal rate and regular rhythm.     Heart sounds: No murmur heard. No friction rub.  Pulmonary:     Effort: Pulmonary effort is normal. No respiratory distress.     Breath sounds: Normal breath sounds. No wheezing or rales.  Abdominal:     General: Bowel sounds are normal. There is no distension.     Palpations: Abdomen is soft.     Tenderness: There is no abdominal tenderness.  Genitourinary:    Rectum: Normal. Guaiac result positive.  Musculoskeletal:        General: No edema. Normal range of motion.     Cervical back: Normal range of motion and neck supple.     Right lower leg: No edema.     Left lower leg: No edema.  Skin:    General: Skin is warm and dry.  Neurological:     Mental Status: He is alert and oriented to person, place, and time.     Cranial Nerves: No cranial nerve deficit.     Coordination: Coordination normal.     Comments: Left-sided hemiplegia is noted.  Neurologic exam otherwise within normal limits.     ED Results /  Procedures / Treatments   Labs (all labs ordered are listed, but only abnormal results are displayed) Labs Reviewed  COMPREHENSIVE METABOLIC PANEL - Abnormal; Notable for the following components:      Result Value   Glucose, Bld 113 (*)    Creatinine, Ser 1.26 (*)    Albumin 2.8 (*)    All other components within normal limits  CBC - Abnormal; Notable for the following components:   RBC 4.01 (*)    Hemoglobin 12.0 (*)    HCT 38.3 (*)    RDW 16.2 (*)    All other components within normal limits  URINALYSIS, ROUTINE W REFLEX MICROSCOPIC - Abnormal; Notable for the following components:   Color, Urine BROWN (*)    APPearance CLOUDY (*)    Specific Gravity, Urine >1.030 (*)    Hgb urine dipstick LARGE (*)    Bilirubin Urine MODERATE (*)    Protein, ur >300 (*)    Nitrite POSITIVE (*)    Leukocytes,Ua TRACE (*)    All other components within normal limits  URINALYSIS, MICROSCOPIC (REFLEX) - Abnormal; Notable for the following components:   Bacteria, UA MANY (*)  All other components within normal limits  CK  POC OCCULT BLOOD, ED  POC OCCULT BLOOD, ED  TYPE AND SCREEN    EKG None  Radiology No results found.  Procedures Procedures   Medications Ordered in ED Medications  sodium chloride 0.9 % bolus 1,000 mL (has no administration in time range)    ED Course  I have reviewed the triage vital signs and the nursing notes.  Pertinent labs & imaging results that were available during my care of the patient were reviewed by me and considered in my medical decision making (see chart for details).    MDM Rules/Calculators/A&P  Patient is a 70 year old male with extensive past medical history as described in the HPI.  Patient takes Eliquis and presents with blood in his urine and dark stools.  Patient's dark urine seems to be related to a renal calculus in the proximal ureter on the left side.  He also has heme positive stool that is greenish-black in color and  immediately heme positive.  He has had a 2.4 g hemoglobin drop since his last laboratory studies.  I feel as though patient will require admission and have spoken with Dr. Tonie Griffith about this.  Patient to be admitted to the hospitalist service.  Final Clinical Impression(s) / ED Diagnoses Final diagnoses:  None    Rx / DC Orders ED Discharge Orders    None       Veryl Speak, MD 04/29/20 [phone]

## 2020-04-29 NOTE — Op Note (Addendum)
Operative Note  Preoperative diagnosis:  1.  Obstructing left mid ureteral calculus  Postoperative diagnosis: 1.  Obstructing left mid ureteral calculus 2.  Undermining flap measuring approximately 5 French in circumference involving the distal aspects of the left ureter  Procedure(s): 1.  Cystoscopy with left diagnostic ureteroscopy and left ureteral stent placement 2.  Left retrograde pyelogram with intraoperative interpretation of fluoroscopic imaging  Surgeon: Ellison Hughs, MD  Assistants:  None  Anesthesia:  General  Complications: A five French flap was created with the ureteral catheter while attempting to obtain a left retrograde pyelogram.  This led to extravasation of contrast outside of the urinary tract.  I performed ureteroscopy and found the true lumen of the left ureter and successfully placed a left JJ stent without further complication.  EBL: 5 mL  Specimens: 1.  Urine for culture and sensitivity  Drains/Catheters: 1.  6 French, 24 cm JJ stent without tether 2.  16 French Foley catheter with 10 mL of sterile water in the balloon  Intraoperative findings:   1. Left retrograde pyelogram initially revealed extravasation of contrast outside of the distal aspects of the left ureter.  Multiple attempts to navigate the wire into the true lumen of the left ureter fluoroscopically were unsuccessful.  Extravasation of contrast was seen adjacent to the bladder as well as along the left retroperitoneum.  Semirigid ureteroscopy was performed in the true lumen of the ureter was identified and a wire was advanced up to the left renal pelvis, or fluoroscopic guidance.  Left retrograde pyelogram revealed a mildly dilated upper urinary tract with no filling defects within the left renal pelvis or along the length of the left ureter.  Ultimately, there was a small flap created by the five [place] ureteral catheter that misguided the Glidewire outside of the left ureter. 2. Purulent  debris was seen draining from the left ureter following left JJ stent placement  Indication:  Gregory Wiley is a 70 y.o. male with an obstructing 4 mm left mid ureteral calculus with associated UTI and gross hematuria.  Past medical history significant for CVA with left hemiparesis, PE requiring anticoagulation with Eliquis.  He has been consented for the above procedures, voices understanding and wishes to proceed.  Description of procedure:  After informed consent was obtained, the patient was brought to the operating room and general LMA anesthesia was administered. The patient was then placed in the dorsolithotomy position and prepped and draped in the usual sterile fashion. A timeout was performed. A 23 French rigid cystoscope was then inserted into the urethral meatus and advanced into the bladder under direct vision. A complete bladder survey revealed no intravesical pathology.  A 5 French ureteral catheter was then inserted into the left ureteral orifice and a retrograde pyelogram was obtained, with the findings listed above.  A Glidewire was then used to intubate the lumen of the ureteral catheter, but resistance was met when trying to advance the wire.  There was extravasation of contrast adjacent to the bladder as well as along the left retroperitoneum as outlined above.    The rigid cystoscope was exchanged for a semirigid ureteroscope that was advanced into the distal aspects of the left ureter where a small flat, that was likely created from the ureteral catheter, was identified.  The true lumen of the left ureter was identified and a Glidewire was advanced up to the left renal pelvis, or fluoroscopic guidance.  The semirigid ureteroscope was then removed under direct vision.  The rigid  cystoscope was then advanced back into the bladder over the wire.  A six [place], 24 cm JJ stent was then advanced over the wire and into good position within the left collecting system, confirming placement  via fluoroscopy.  There was purulent appearing urine draining around the left ureteral stent following placement.  The rigid cystoscope was then removed.  A 16 French Foley catheter was then placed with return of clear irrigant.  The catheter was then placed to gravity drainage.  The patient tolerated the procedure well and was transferred to the postanesthesia in stable condition.  Plan: Keep Foley catheter in place for at least 24 hours.  Blood and urine cultures are pending.  Continue broad-spectrum antibiotics.  The patient will need definitive stone treatment in approximately 2 weeks.

## 2020-04-29 NOTE — Progress Notes (Addendum)
[place] OF CARE NOTE Patient: Gregory Wiley QSX:[id number]   PCP: Tonia Ghent, MD DOB: [birth year]   DOA: 04/28/2020   DOS: 04/29/2020    Patient was admitted by my colleague earlier on 04/29/2020. I have reviewed the H&P as well as assessment and plan and agree with the same. Important changes in the plan are listed below.  Plan of care: Principal Problem:   Complicated UTI (urinary tract infection) Active Problems:   Chronic combined systolic and diastolic congestive heart failure (HCC)   Chronic anticoagulation   GI bleeding   Hydronephrosis with renal and ureteral calculus obstruction   Hematuria   Hemiparesis affecting left side as late effect of cerebrovascular accident (CVA) [facility]) Discussed with urology as well as GI regarding patient's current presentation and plan of care. Appreciate their assistance. Continue to monitor.  RN inform me that the patient wants to be DNR.  CODE STATUS changed to DNR. CPAP ordered.  Author: Berle Mull, MD [facility] 04/29/2020 5:13 PM   If 7PM-7AM, please contact night-coverage at [email or web address]

## 2020-04-29 NOTE — Progress Notes (Signed)
CHG completed and PCR for MRSA completed and sent to lab. Pt has a ring on his left hand that will not come off. Pt to OR in bed.

## 2020-04-29 NOTE — Progress Notes (Signed)
Pt placed on cardiac monitoring.  

## 2020-04-29 NOTE — Anesthesia Procedure Notes (Signed)
Procedure Name: LMA Insertion Date/Time: 04/29/2020 3:11 PM Performed by: Clovis Cao, CRNA Pre-anesthesia Checklist: Patient identified, Emergency Drugs available, Suction available, Patient being monitored and Timeout performed Patient Re-evaluated:Patient Re-evaluated prior to induction Oxygen Delivery Method: Circle system utilized Preoxygenation: Pre-oxygenation with 100% oxygen Induction Type: IV induction LMA: LMA inserted LMA Size: 5.0 Number of attempts: 1 Placement Confirmation: positive ETCO2 and breath sounds checked- equal and bilateral Tube secured with: Tape Dental Injury: Teeth and Oropharynx as per pre-operative assessment

## 2020-04-29 NOTE — H&P (Signed)
History and Physical    Gregory Wiley ZTI:[id number] DOB: 08-14-1950 DOA: 04/28/2020  PCP: Tonia Ghent, MD   Patient coming from:  Home  Chief Complaint: Blood in urine, dark tarry stool  HPI: Gregory Wiley is a 70 y.o. male with medical history significant for CVA with residual left hemiparesis, peptic ulcer disease, GERD, cardiomyopathy, CAD, hyperlipidemia, history of PE who presents for evaluation of hematuria and dark tarry stools.  Patient is on Eliquis for anticoagulation chronically.  He reports that yesterday afternoon he had very bloody urine with some mild dysuria.  He denies any fever or chills.  He also reports having some lower abdominal cramping and had dark tarry stools yesterday.  He has not had any injury or trauma to his abdomen.  He has had some bruising of his right flank and periumbilical region.  States he has never had kidney stones.  He does not take any medications for symptoms at home.  He does not have any chest pain, pressure, palpitations, shortness of breath.  He has not had any change in his diet.  He reports that 3 weeks ago he developed a cough and did not feel well for a few days.  He was not tested for Covid but assumed he had it as he was exposed to it from family member.  He quarantined at home and states that symptoms have resolved except for a very mild intermittent dry cough.   ED Course: He is found to have a 5 mm obstructing stone in the mid proximal left ureter causing mild hydronephrosis on CT scan.  He is also found to have hematuria.  He has Hemoccult positive stool on testing.  Hemoglobin is 12.  Review of Systems:  General: Denies fever, chills, weight loss, night sweats.  Denies dizziness.  Denies change in appetite HENT: Denies head trauma, headache, denies change in hearing, tinnitus.  Denies nasal congestion or bleeding.  Denies sore throat, sores in mouth.  Denies difficulty swallowing Eyes: Denies blurry vision, pain in eye, drainage.   Denies discoloration of eyes. Neck: Denies pain.  Denies swelling.  Denies pain with movement. Cardiovascular: Denies chest pain, palpitations.  Denies edema.  Denies orthopnea Respiratory: Denies shortness of breath, cough.  Denies wheezing.  Denies sputum production Gastrointestinal: Denies abdominal pain, swelling.  Denies nausea, vomiting, diarrhea. Reports melena.  Denies hematemesis. Musculoskeletal: Has chronic left hemiparesis. denies deformity or swelling.  Denies pain.  Denies arthralgias or myalgias. Genitourinary: Denies pelvic pain.  Denies urinary frequency or hesitancy.  Reports dysuria and hematuria.  Skin: Denies rash.  Denies petechiae, purpura. Reports ecchymosis. Neurological: Denies headache.  Denies syncope.  Denies seizure activity. Denies slurred speech, drooping face.  Denies visual change. Psychiatric: Denies depression, anxiety. Denies hallucinations.  Past Medical History:  Diagnosis Date  . Adrenal insufficiency ([place])   . Anxiety   . Basal cell carcinoma of skin of left ankle   . Bleeding stomach ulcer 2015  . Coronary artery disease    stent, then CABG 07/20/10  . Daily headache    "for the past month" (11/11/2014)  . Depression   . Diverticulosis   . GERD (gastroesophageal reflux disease)   . Gout   . H/O cardiomyopathy    ischemic, now last echo 07/30/11, EF 38%W  . History of hiatal hernia   . Hyperlipidemia   . Hypertension   . Internal hemorrhoids   . Left hemiplegia ([place])   . NSTEMI (non-ST elevated myocardial infarction) ([place])  NSTEMI- last cath 06/2011-Stent to LCX-DES, last nuc 07/30/11 low risk  . Obesity (BMI 30-39.9)   . OSA on CPAP    since July 2013- uses CPAP sometimes , states he has lost 147 lbs. since stroke & doesn't use the CPAP as much as he use to.   . Plantar fasciitis of left foot   . Pneumonia    hosp. 12/2014  . Pulmonary embolism ([place]) 03/2014  . Stroke ([place]) 01/2014   "no control of LUE, can slightly LLE; memory problems  since" (11/11/2014)  . Tubular adenoma of colon     Past Surgical History:  Procedure Laterality Date  . BASAL CELL CARCINOMA EXCISION Left 2015   ankle  . CARDIAC SURGERY    . CATARACT EXTRACTION Bilateral   . COLONOSCOPY N/A 02/10/2013   Procedure: COLONOSCOPY;  Surgeon: Ladene Artist, MD;  Location: [facility];  Service: Endoscopy;  Laterality: N/A;  . CORONARY ANGIOPLASTY WITH STENT PLACEMENT  07/01/2011   DES-Resolute to native LCX  . CORONARY ANGIOPLASTY WITH STENT PLACEMENT  12/01/2007   COMPLEX 5 LESION PCI INCLUDING CUTTING BALLOON AND 4 CYPHER DESs  . CORONARY ARTERY BYPASS GRAFT  07/20/2010    LIMA-LAD; VG-ACUTE MARG of RCA; Seq VG-distal RCA & then pda  . EP IMPLANTABLE DEVICE N/A 02/14/2016   Procedure: Loop Recorder Removal;  Surgeon: Thompson Grayer, MD;  Location: [facility] CV LAB;  Service: Cardiovascular;  Laterality: N/A;  . ESOPHAGOGASTRODUODENOSCOPY  01/2014   gastric ulcer, erosive gastroduodenitis, H Pylori negative.   . ESOPHAGOGASTRODUODENOSCOPY (EGD) WITH PROPOFOL N/A 11/14/2014   Procedure: ESOPHAGOGASTRODUODENOSCOPY (EGD) WITH PROPOFOL;  Surgeon: Milus Banister, MD;  Location: [facility];  Service: Endoscopy;  Laterality: N/A;  . HAND SURGERY Right    to take glass out  . HERNIA REPAIR    . INTRAMEDULLARY (IM) NAIL INTERTROCHANTERIC Right 02/07/2015   Procedure: INTRAMEDULLARY (IM) NAIL INTERTROCHANTRIC RIGHT HIP;  Surgeon: Renette Butters, MD;  Location: [facility];  Service: Orthopedics;  Laterality: Right;  . KNEE ARTHROSCOPY Right   . LEFT HEART CATHETERIZATION WITH CORONARY/GRAFT ANGIOGRAM  07/01/2011   Procedure: LEFT HEART CATHETERIZATION WITH Beatrix Fetters;  Surgeon: Sanda Klein, MD;  Location: [facility] [facility] CATH LAB;  Service: Cardiovascular;;  . LOOP RECORDER IMPLANT  02/15/2014   MDT LINQ implanted by Dr Rayann Heman for cryptogenic stroke  . PERCUTANEOUS CORONARY STENT INTERVENTION (PCI-S) Right 07/01/2011   Procedure: PERCUTANEOUS CORONARY STENT  INTERVENTION (PCI-S);  Surgeon: Sanda Klein, MD;  Location: [facility] C[facility] LAB;  Service: Cardiovascular;  Laterality: Right;  . PICC LINE PLACE PERIPHERAL ([place] HX)  06/2015  . RETINAL DETACHMENT SURGERY Right   . TEE WITHOUT CARDIOVERSION N/A 02/15/2014   Procedure: TRANSESOPHAGEAL ECHOCARDIOGRAM (TEE);  Surgeon: Josue Hector, MD;  Location: [facility] ENDOSCOPY;  Service: Cardiovascular;  Laterality: N/A;  . UMBILICAL HERNIA REPAIR  2006    Social History  reports that he quit smoking about 51 years ago. His smoking use included cigarettes. He has a 8.00 pack-year smoking history. He has never used smokeless tobacco. He reports that he does not drink alcohol and does not use drugs.  Allergies  Allergen Reactions  . Diphenhydramine Hcl Anaphylaxis  . Adhesive [Tape] Other (See Comments)    Tears skin - please use paper tape  . Proscar [Finasteride] Other (See Comments)    Lightheaded.   . Tamsulosin Other (See Comments)    Dizzy, vomiting.     Family History  Problem Relation Age of Onset  . Diabetes type II  Mother   . CAD Father 53       CABG, PCI, died at 66  . Heart attack Father 12  . Hypertension Brother   . Diabetes Brother   . Hyperlipidemia Brother   . Brain cancer Maternal Grandmother   . COPD Paternal Grandfather   . Coronary artery disease Other   . Other Other        denies family history of DVT/PE  . Colon cancer Neg Hx   . Stroke Neg Hx   . Prostate cancer Neg Hx      Prior to Admission medications   Medication Sig Start Date End Date Taking? Authorizing Provider  acetaminophen (TYLENOL) 325 MG tablet Take 1 tablet (325 mg total) by mouth every 6 (six) hours as needed for mild pain. 12/27/14   Florencia Reasons, MD  Armodafinil 200 MG TABS TAKE 1 TABLET BY MOUTH EVERY DAY IN THE MORNING 11/29/19   Sater, Nanine Means, MD  cholecalciferol (VITAMIN D) 1000 units tablet Take 1,000 Units by mouth daily.    [provider]  [facility] MELATONIN 10 MG CAPS TAKE 1 CAPSULE BY MOUTH AT  BEDTIME AS NEEDED FOR SLEEP 08/30/19   Tonia Ghent, MD  doxepin (SINEQUAN) 10 MG capsule TAKE 1 CAPSULE AT BEDTIME 11/29/19   Sater, Nanine Means, MD  ELDERBERRY PO Take by mouth. 3 a day    [provider]  ELIQUIS 5 MG TABS tablet TAKE 1 TABLET BY MOUTH TWICE A DAY 12/15/19   [name], MD  ferrous sulfate 325 (65 FE) MG tablet TAKE 1 TABLET TWICE A DAY WITH FOOD 02/17/20   Tonia Ghent, MD  gabapentin (NEURONTIN) 300 MG capsule Take one or two at bedtime 07/14/19   Sater, Nanine Means, MD  HYDROcodone-acetaminophen (NORCO/VICODIN) 5-325 MG tablet Take 1 tablet by mouth daily as needed for moderate pain. 12/29/19   Sater, Nanine Means, MD  hydrocortisone (ANUSOL-HC) 25 MG suppository Place 1 suppository (25 mg total) rectally 2 (two) times daily as needed. X 1 week; then, use as needed thereafter. 07/07/19   Tonia Ghent, MD  Krill Oil 1000 MG CAPS Take by mouth daily.    [provider]  lansoprazole (PREVACID) 30 MG capsule Take 1 capsule (30 mg total) by mouth 2 (two) times daily before a meal. 07/04/19   Tonia Ghent, MD  midodrine (PROAMATINE) 2.5 MG tablet Take 1 tablet (2.5 mg total) by mouth 3 (three) times daily with meals. 04/26/20   [name], MD  nitroGLYCERIN (NITROSTAT) 0.4 MG SL tablet Place 1 tablet (0.4 mg total) under the tongue every 5 (five) minutes as needed for chest pain. 07/18/17   Tonia Ghent, MD  NON FORMULARY Spirulina  3 capsules once daily    [provider]  NONFORMULARY OR COMPOUNDED ITEM Supplement menmoringa oleifera taken daily.    [provider]  nystatin (MYCOSTATIN/NYSTOP) powder Apply topically 2 (two) times daily. 07/18/17   Tonia Ghent, MD  OVER THE COUNTER MEDICATION Ardath Sax- takes 2 a day, 1200 mg    [provider]  potassium chloride (KLOR-CON M10) 10 MEQ tablet TAKE 1 TABLETS BY MOUTH 2 TIMES DAILY. 01/28/20   Tonia Ghent, MD  PRESCRIPTION MEDICATION Inhale into the lungs at  bedtime. CPAP    [provider]  prochlorperazine (COMPAZINE) 10 MG tablet Take 1 tablet (10 mg total) by mouth every 8 (eight) hours as needed (headache, nausea or vomiting). 03/19/16  Forde Dandy, MD  QC LO-DOSE ASPIRIN 81 MG EC tablet TAKE 1 TABLET (81 MG TOTAL) BY MOUTH DAILY. SWALLOW WHOLE. 12/11/17   [name], MD  QC STOOL SOFTENER PLS LAXATIVE 8.6-50 MG tablet TAKE 1 TABLET BY MOUTH 2 TIMES DAILY. 07/14/18   Tonia Ghent, MD  rosuvastatin (CRESTOR) 40 MG tablet TAKE 1 TABLET BY MOUTH EVERY DAY 01/03/20   Tonia Ghent, MD  senna (SENOKOT) 8.6 MG tablet Take 1 tablet by mouth 2 (two) times daily.    [provider]  tiZANidine (ZANAFLEX) 4 MG capsule Take 1 capsule (4 mg total) by mouth as needed. 03/06/17   Sater, Nanine Means, MD  potassium chloride (K-DUR) 10 MEQ tablet TAKE 1 TABLETS BY MOUTH 2 TIMES DAILY. 12/08/18 01/28/20  Tonia Ghent, MD    Physical Exam: Vitals:   04/29/20 0230 04/29/20 0300 04/29/20 0330 04/29/20 0400  BP: 104/61 94/60 (!) 100/53 105/61  Pulse: 67 67 66 75  Resp: 18 15 15 17   Temp:      TempSrc:      SpO2: 100% 98% 99% 100%    Constitutional: NAD, calm, comfortable Vitals:   04/29/20 0230 04/29/20 0300 04/29/20 0330 04/29/20 0400  BP: 104/61 94/60 (!) 100/53 105/61  Pulse: 67 67 66 75  Resp: 18 15 15 17   Temp:      TempSrc:      SpO2: 100% 98% 99% 100%   General: WDWN, Alert and oriented x3.  Eyes: EOMI, PERRL, conjunctivae normal.  Sclera nonicteric HENT:  [place]/AT, external ears normal.  Nares patent without epistasis.  Mucous membranes are moist. Neck: Soft, normal range of motion, supple, no masses, no thyromegaly. Trachea midline Respiratory: clear to auscultation bilaterally, no wheezing, no crackles. Normal respiratory effort. No accessory muscle use.  Cardiovascular: Regular rate and rhythm, no murmurs / rubs / gallops. No extremity edema. 1+ pedal pulses.  Abdomen: Soft, no tenderness, nondistended, no  rebound or guarding.  Obese.  No masses palpated. Bowel sounds normoactive. Hemoccult positive  Musculoskeletal: Left hemiparesis which is chronic.  No cyanosis. No joint deformity upper and lower extremities. Normal muscle tone.  Skin: Warm, dry, intact no rashes, lesions, ulcers. No induration Neurologic: CN 2-12 grossly intact.  Normal speech.  Sensation intact in right upper and lower extremity.  Psychiatric: Normal judgment and insight.  Normal mood.     Labs on Admission: I have personally reviewed following labs and imaging studies  CBC: Recent Labs  Lab 04/28/20 1623  WBC 8.3  HGB 12.0*  HCT 38.3*  MCV 95.5  PLT 229    Basic Metabolic Panel: Recent Labs  Lab 04/28/20 1623  NA 141  K 3.8  CL 106  CO2 27  GLUCOSE 113*  BUN 12  CREATININE 1.26*  CALCIUM 9.5    GFR: Estimated Creatinine Clearance: 70.4 mL/min (A) (by C-G formula based on SCr of 1.26 mg/dL (H)).  Liver Function Tests: Recent Labs  Lab 04/28/20 1623  AST 26  ALT 13  ALKPHOS 64  BILITOT 0.8  PROT 6.9  ALBUMIN 2.8*    Urine analysis:    Component Value Date/Time   COLORURINE BROWN (A) 04/28/2020 2140   APPEARANCEUR CLOUDY (A) 04/28/2020 2140   LABSPEC >1.030 (H) 04/28/2020 2140   PHURINE 6.5 04/28/2020 2140   GLUCOSEU NEGATIVE 04/28/2020 2140   HGBUR LARGE (A) 04/28/2020 2140   BILIRUBINUR MODERATE (A) 04/28/2020 2140   BILIRUBINUR Neg 12/14/2018 1505   KETONESUR NEGATIVE 04/28/2020 2140  PROTEINUR >300 (A) 04/28/2020 2140   UROBILINOGEN 0.2 12/14/2018 1505   UROBILINOGEN 0.2 12/25/2014 2016   NITRITE POSITIVE (A) 04/28/2020 2140   LEUKOCYTESUR TRACE (A) 04/28/2020 2140    Radiological Exams on Admission: CT Renal Stone Study  Result Date: 04/29/2020 CLINICAL DATA:  Hematuria. EXAM: CT ABDOMEN AND PELVIS WITHOUT CONTRAST TECHNIQUE: Multidetector CT imaging of the abdomen and pelvis was performed following the standard protocol without IV contrast. COMPARISON:  Noncontrast CT  12/04/2017 FINDINGS: Lower chest: Coronary artery calcifications. Left basilar atelectasis. There is subpleural fat in the periphery of the left hemithorax. Hepatobiliary: Scattered punctate granuloma. No focal lesion on noncontrast exam. Gallbladder physiologically distended, no calcified stone. No biliary dilatation. Pancreas: Fatty atrophy.  No ductal dilatation or inflammation. Spleen: Calcified granuloma.  Normal in size. Adrenals/Urinary Tract: No adrenal nodule. Obstructing 4 x 5 mm stone in the mid proximal left ureter with mild left hydronephrosis. Additional punctate nonobstructing stone in the lower left kidney. Mild left perinephric edema. Calcification in the lower right kidney is favored to be vascular rather than nonobstructing stone. Slight increased size of a 4 cm cyst in the mid right kidney, previously 3.5 cm. Smaller cyst in the lower right kidney. Urinary bladder is partially distended. Question of focal wall thickening at the left bladder base with punctate calcification, series 3, image 83. Stomach/Bowel: Decompressed stomach. No small bowel obstruction or inflammation. Scattered intraluminal densities throughout the colon suggesting ingested pills. Appendix not confidently visualized, no evidence of appendicitis. Moderate colonic stool burden. Sigmoid colonic tortuosity. No colonic inflammation. Vascular/Lymphatic: Aorto bi-iliac atherosclerosis. No aortic aneurysm. No bulky abdominopelvic adenopathy. Reproductive: Enlarged prostate gland spans 5.5 cm transverse. Other: No free air or ascites. Suspect prior umbilical hernia repair. Bilateral fat containing inguinal hernias, left greater than right. Musculoskeletal: Lumbar spondylosis. Surgical hardware in the right proximal femur. Stable peripherally sclerotic lucency about the trans trochanteric screw. No acute osseous abnormalities are seen. IMPRESSION: 1. Obstructing 4 x 5 mm stone in the mid proximal left ureter with mild left  hydronephrosis. Additional punctate nonobstructing stone suspected in the lower left kidney. 2. Questionable focal bladder wall thickening with calcification. Recommend direct visualization with cystoscopy to exclude underlying bladder lesion. 3. Enlarged prostate gland. Aortic Atherosclerosis (ICD10-I70.0). Electronically Signed   By: Keith Rake M.D.   On: 04/29/2020 01:25    Assessment/Plan Principal Problem:   Complicated UTI (urinary tract infection) Mr. Ackerman is admitted to med/surg floor. Started on Rocephin for antibiotic coverage.  IVF hydration with LR provided.  Urology consulted with left ureter stone causing obstruction with mild hydronephrosis  Active Problems:   GI bleeding Gastroenterology consulted.  Secure chat sent to Dr. Ardis Hughs for consult in the morning. Eliquis is held. Protonix IV daily    Hematuria Secondary to renal stone and UTI.    Chronic combined systolic and diastolic congestive heart failure  No signs of volume overload at this time.  Continue statin    Hydronephrosis with renal and ureteral calculus obstruction Urology consulted as above    Chronic anticoagulation Patient has been on Eliquis secondary to stroke.  Eliquis is held with GI bleeding and hematuria    Hemiparesis affecting left side as late effect of cerebrovascular accident (CVA)  Stable and chronic     DVT prophylaxis: SCDs for DVT prophylaxis. Eliquis is held with hematuria and GI bleeding Code Status:   Full code  Family Communication:  Diagnosis and plan discussed with patient and his wife who is at the bedside.  They  verbalized understanding and agree with plan.  Further recommendations to follow as clinically indicated Disposition Plan:   Patient is from:  Home  Anticipated DC to:  Home  Anticipated DC date:  Anticipate 2 midnight or more stay in the hospital to treat acute condition  Anticipated DC barriers: No barriers to discharge identified at this time  Consults  called:  GI-Dr. Marijo File chat sent; Urology-Dr. Forestine Chute chat sent  Admission status:  Inpatient  Yevonne Aline Kevon Tench MD [facility]  How to contact the [facility] Attending or Consulting provider [place] or covering provider during after hours [place], for this patient?   1. Check the care team in [facility] and look for a) attending/consulting [facility] provider listed and b) the [facility] team listed 2. Log into [email or web address] and use [place]'s universal password to access. If you do not have the password, please contact the hospital operator. 3. Locate the [facility] provider you are looking for under [facility] and page to a number that you can be directly reached. 4. If you still have difficulty reaching the provider, please page the [facility] (Director on Call) for the Hospitalists listed on amion for assistance.  04/29/2020, 4:29 AM

## 2020-04-29 NOTE — ED Notes (Signed)
Pt returned from CT scan.

## 2020-04-29 NOTE — Anesthesia Postprocedure Evaluation (Signed)
Anesthesia Post Note  Patient: Gregory Wiley  Procedure(s) Performed: CYSTOSCOPY WITH RETROGRADE PYELOGRAM/URETERAL STENT PLACEMENT (Left )     Patient location during evaluation: PACU Anesthesia Type: General Level of consciousness: awake Pain management: pain level controlled Vital Signs Assessment: post-procedure vital signs reviewed and stable Respiratory status: spontaneous breathing, nonlabored ventilation, respiratory function stable and patient connected to nasal cannula oxygen Cardiovascular status: blood pressure returned to baseline and stable Postop Assessment: no apparent nausea or vomiting Anesthetic complications: no   No complications documented.  Last Vitals:  Vitals:   04/29/20 1630 04/29/20 1716  BP: 111/62 (!) 107/57  Pulse: 63 69  Resp: 15 16  Temp: (!) 36.1 C   SpO2: 100% 99%    Last Pain:  Vitals:   04/29/20 1630  TempSrc:   PainSc: 0-No pain                 Shamira Toutant P Briyanna Billingham

## 2020-04-29 NOTE — Anesthesia Preprocedure Evaluation (Addendum)
Anesthesia Evaluation  Patient identified by MRN, date of birth, ID band Patient awake    Reviewed: Allergy & Precautions, NPO status , Patient's Chart, lab work & pertinent test results  Airway Mallampati: II  TM Distance: >3 FB Neck ROM: Full    Dental  (+) Poor Dentition, Missing,    Pulmonary sleep apnea and Continuous Positive Airway Pressure Ventilation , former smoker, PE   Pulmonary exam normal breath sounds clear to auscultation       Cardiovascular hypertension, + angina + CAD, + Past MI, + Cardiac Stents, + CABG and +CHF  Normal cardiovascular exam Rhythm:Regular Rate:Normal  ECHO: 1. Left ventricular ejection fraction, by estimation, is 50%. The left ventricle has low normal function. The left ventricle demonstrates regional wall motion abnormalities (see scoring diagram/findings for description). Left ventricular diastolic parameters are consistent with Grade I diastolic dysfunction (impaired relaxation).  2. Right ventricular systolic function is mildly reduced. The right ventricular size is normal. Tricuspid regurgitation signal is inadequate for assessing PA pressure.  3. The mitral valve is grossly normal. Trivial mitral valve  regurgitation. No evidence of mitral stenosis.  4. The aortic valve is abnormal. There is moderate calcification of the aortic valve. Aortic valve regurgitation is mild to moderate. Mild aortic valve stenosis. Aortic valve mean gradient measures 11.0 mmHg.  5. Aortic dilatation noted. There is mild dilatation of the aortic root and of the ascending aorta, measuring 42 mm.    Neuro/Psych  Headaches, PSYCHIATRIC DISORDERS Anxiety Depression Left hemiplegia  CVA, Residual Symptoms    GI/Hepatic Neg liver ROS, hiatal hernia, PUD, GERD  Medicated and Controlled,  Endo/Other  negative endocrine ROS  Renal/GU Renal disease     Musculoskeletal Gout   Abdominal   Peds  Hematology  (+)  anemia , HLD   Anesthesia Other Findings RENAL CALCULUS  Reproductive/Obstetrics                           Anesthesia Physical Anesthesia Plan  ASA: III  Anesthesia Plan: General   Post-op Pain Management:    Induction: Intravenous  PONV Risk Score and Plan: 3 and Ondansetron, Dexamethasone and Treatment may vary due to age or medical condition  Airway Management Planned: LMA  Additional Equipment:   Intra-op Plan:   Post-operative Plan: Extubation in OR  Informed Consent: I have reviewed the patients History and Physical, chart, labs and discussed the procedure including the risks, benefits and alternatives for the proposed anesthesia with the patient or authorized representative who has indicated his/her understanding and acceptance.     Dental advisory given  Plan Discussed with: CRNA  Anesthesia Plan Comments:         Anesthesia Quick Evaluation

## 2020-04-29 NOTE — H&P (View-Only) (Signed)
Consultation  Referring Provider:     Harrold Donath Primary Care Physician:  Tonia Ghent, MD Primary Gastroenterologist:     Lucio Edward     Reason for Consultation:     Melena, anemia         HPI:   Gregory Wiley is a 70 y.o. male with a remarkable history of stroke with left hemiparesis, history of peptic ulcer disease in 2015, history of pulmonary embolism, on chronic Eliquis, who presented to the emergency room yesterday with hematuria.  He states he developed acute onset hematuria yesterday, has never had a history of kidney stones.  He also endorses that he had 2 black tarry bowel movements yesterday afternoon which is new for him.  He does take oral iron supplement once daily but states his stools are normally brown.  He does have some baseline constipation but otherwise no routine bleeding symptoms.  He does not really have any abdominal pain at all today but states he had some nausea yesterday but without any vomiting.  His last dose of Eliquis was yesterday afternoon.  His hemoglobin was found to be 12 in the ED, down from normal hemoglobin at baseline, with heme positive stool.  He had a CT scan done which showed a 5 mm obstructing renal stone in the mid proximal left ureter causing mild hydronephrosis on the left.  His GFR is greater than 60.  He states his last bowel movement was yesterday afternoon, he has not passedanything since that time.  He denies any cardiopulmonary symptoms at this time.  He denies any problems with anesthesia historically.  He denies any NSAID use.  He states he takes omeprazole for history of GERD and this typically controls his symptoms pretty well.  He denies any history of problems with ulcers or GI bleeding since 2015.  He had a follow-up endoscopy in August 2016 which confirmed healing of gastric ulcer.  His last colonoscopy was in 2014 at which time he had 2 small polyps and some diverticulosis.  Unfortunately he ate a [place] sandwich this  morning prior to my evaluation, is not been n.p.o.   Prior work-up: EGD 10/2014 -hiatal hernia, mild Schatzki ring at the [place], gastritis  EGD 01/2014 -antral ulcer  Colonoscopy 01/2013 -2 small polyps, diverticulosis  Echocardiogram 01/2020 -EF 84%, grade 1 diastolic dysfunction    Past Medical History:  Diagnosis Date  . Adrenal insufficiency ([place])   . Anxiety   . Basal cell carcinoma of skin of left ankle   . Bleeding stomach ulcer 2015  . Coronary artery disease    stent, then CABG 07/20/10  . Daily headache    "for the past month" (11/11/2014)  . Depression   . Diverticulosis   . GERD (gastroesophageal reflux disease)   . Gout   . H/O cardiomyopathy    ischemic, now last echo 07/30/11, EF 38%W  . History of hiatal hernia   . Hyperlipidemia   . Hypertension   . Internal hemorrhoids   . Left hemiplegia ([place])   . NSTEMI (non-ST elevated myocardial infarction) ([place])    NSTEMI- last cath 06/2011-Stent to LCX-DES, last nuc 07/30/11 low risk  . Obesity (BMI 30-39.9)   . OSA on CPAP    since July 2013- uses CPAP sometimes , states he has lost 147 lbs. since stroke & doesn't use the CPAP as much as he use to.   . Plantar fasciitis of left foot   . Pneumonia  hosp. 12/2014  . Pulmonary embolism ([place]) 03/2014  . Stroke ([place]) 01/2014   "no control of LUE, can slightly LLE; memory problems since" (11/11/2014)  . Tubular adenoma of colon     Past Surgical History:  Procedure Laterality Date  . BASAL CELL CARCINOMA EXCISION Left 2015   ankle  . CARDIAC SURGERY    . CATARACT EXTRACTION Bilateral   . COLONOSCOPY N/A 02/10/2013   Procedure: COLONOSCOPY;  Surgeon: Ladene Artist, MD;  Location: [facility];  Service: Endoscopy;  Laterality: N/A;  . CORONARY ANGIOPLASTY WITH STENT PLACEMENT  07/01/2011   DES-Resolute to native LCX  . CORONARY ANGIOPLASTY WITH STENT PLACEMENT  12/01/2007   COMPLEX 5 LESION PCI INCLUDING CUTTING BALLOON AND 4 CYPHER DESs  . CORONARY ARTERY BYPASS  GRAFT  07/20/2010    LIMA-LAD; VG-ACUTE MARG of RCA; Seq VG-distal RCA & then pda  . EP IMPLANTABLE DEVICE N/A 02/14/2016   Procedure: Loop Recorder Removal;  Surgeon: Thompson Grayer, MD;  Location: [facility];  Service: Cardiovascular;  Laterality: N/A;  . ESOPHAGOGASTRODUODENOSCOPY  01/2014   gastric ulcer, erosive gastroduodenitis, H Pylori negative.   . ESOPHAGOGASTRODUODENOSCOPY (EGD) WITH PROPOFOL N/A 11/14/2014   Procedure: ESOPHAGOGASTRODUODENOSCOPY (EGD) WITH PROPOFOL;  Surgeon: Milus Banister, MD;  Location: [facility];  Service: Endoscopy;  Laterality: N/A;  . HAND SURGERY Right    to take glass out  . HERNIA REPAIR    . INTRAMEDULLARY (IM) NAIL INTERTROCHANTERIC Right 02/07/2015   Procedure: INTRAMEDULLARY (IM) NAIL INTERTROCHANTRIC RIGHT HIP;  Surgeon: Renette Butters, MD;  Location: [facility];  Service: Orthopedics;  Laterality: Right;  . KNEE ARTHROSCOPY Right   . LEFT HEART CATHETERIZATION WITH CORONARY/GRAFT ANGIOGRAM  07/01/2011   Procedure: LEFT HEART CATHETERIZATION WITH Beatrix Fetters;  Surgeon: Sanda Klein, MD;  Location: [facility];  Service: Cardiovascular;;  . LOOP RECORDER IMPLANT  02/15/2014   MDT LINQ implanted by Dr Rayann Heman for cryptogenic stroke  . PERCUTANEOUS CORONARY STENT INTERVENTION (PCI-S) Right 07/01/2011   Procedure: PERCUTANEOUS CORONARY STENT INTERVENTION (PCI-S);  Surgeon: Sanda Klein, MD;  Location: [facility] C[facility] LAB;  Service: Cardiovascular;  Laterality: Right;  . PICC LINE PLACE PERIPHERAL ([place] HX)  06/2015  . RETINAL DETACHMENT SURGERY Right   . TEE WITHOUT CARDIOVERSION N/A 02/15/2014   Procedure: TRANSESOPHAGEAL ECHOCARDIOGRAM (TEE);  Surgeon: Josue Hector, MD;  Location: [facility];  Service: Cardiovascular;  Laterality: N/A;  . UMBILICAL HERNIA REPAIR  2006    Family History  Problem Relation Age of Onset  . Diabetes type II Mother   . CAD Father 63       CABG, PCI, died at 35  . Heart attack Father 64  . Hypertension  Brother   . Diabetes Brother   . Hyperlipidemia Brother   . Brain cancer Maternal Grandmother   . COPD Paternal Grandfather   . Coronary artery disease Other   . Other Other        denies family history of DVT/PE  . Colon cancer Neg Hx   . Stroke Neg Hx   . Prostate cancer Neg Hx      Social History   Tobacco Use  . Smoking status: Former Smoker    Packs/day: 2.00    Years: 4.00    Pack years: 8.00    Types: Cigarettes    Quit date: 01/30/1969    Years since quitting: 51.2  . Smokeless tobacco: Never Used  . Tobacco comment: "quit smoking cigarettes  in the 1970's"  Substance Use Topics  . Alcohol use: No    Alcohol/week: 0.0 standard drinks  . Drug use: No    Comment: "smoked some pot in college"    Prior to Admission medications   Medication Sig Start Date End Date Taking? Authorizing Provider  acetaminophen (TYLENOL) 325 MG tablet Take 1 tablet (325 mg total) by mouth every 6 (six) hours as needed for mild pain. 12/27/14   Florencia Reasons, MD  Armodafinil 200 MG TABS TAKE 1 TABLET BY MOUTH EVERY DAY IN THE MORNING 11/29/19   Sater, Nanine Means, MD  cholecalciferol (VITAMIN D) 1000 units tablet Take 1,000 Units by mouth daily.    [provider]  [facility] MELATONIN 10 MG CAPS TAKE 1 CAPSULE BY MOUTH AT BEDTIME AS NEEDED FOR SLEEP 08/30/19   Tonia Ghent, MD  doxepin (SINEQUAN) 10 MG capsule TAKE 1 CAPSULE AT BEDTIME 11/29/19   Sater, Nanine Means, MD  ELDERBERRY PO Take by mouth. 3 a day    [provider]  ELIQUIS 5 MG TABS tablet TAKE 1 TABLET BY MOUTH TWICE A DAY 12/15/19   [name], MD  ferrous sulfate 325 (65 FE) MG tablet TAKE 1 TABLET TWICE A DAY WITH FOOD 02/17/20   Tonia Ghent, MD  gabapentin (NEURONTIN) 300 MG capsule Take one or two at bedtime 07/14/19   Sater, Nanine Means, MD  HYDROcodone-acetaminophen (NORCO/VICODIN) 5-325 MG tablet Take 1 tablet by mouth daily as needed for moderate pain. 12/29/19   Sater, Nanine Means, MD  hydrocortisone (ANUSOL-HC)  25 MG suppository Place 1 suppository (25 mg total) rectally 2 (two) times daily as needed. X 1 week; then, use as needed thereafter. 07/07/19   Tonia Ghent, MD  Krill Oil 1000 MG CAPS Take by mouth daily.    [provider]  lansoprazole (PREVACID) 30 MG capsule Take 1 capsule (30 mg total) by mouth 2 (two) times daily before a meal. 07/04/19   Tonia Ghent, MD  midodrine (PROAMATINE) 2.5 MG tablet Take 1 tablet (2.5 mg total) by mouth 3 (three) times daily with meals. 04/26/20   [name], MD  nitroGLYCERIN (NITROSTAT) 0.4 MG SL tablet Place 1 tablet (0.4 mg total) under the tongue every 5 (five) minutes as needed for chest pain. 07/18/17   Tonia Ghent, MD  NON FORMULARY Spirulina  3 capsules once daily    [provider]  NONFORMULARY OR COMPOUNDED ITEM Supplement menmoringa oleifera taken daily.    [provider]  nystatin (MYCOSTATIN/NYSTOP) powder Apply topically 2 (two) times daily. 07/18/17   Tonia Ghent, MD  OVER THE COUNTER MEDICATION Ardath Sax- takes 2 a day, 1200 mg    [provider]  potassium chloride (KLOR-CON M10) 10 MEQ tablet TAKE 1 TABLETS BY MOUTH 2 TIMES DAILY. 01/28/20   Tonia Ghent, MD  PRESCRIPTION MEDICATION Inhale into the lungs at bedtime. CPAP    [provider]  prochlorperazine (COMPAZINE) 10 MG tablet Take 1 tablet (10 mg total) by mouth every 8 (eight) hours as needed (headache, nausea or vomiting). 03/19/16   Forde Dandy, MD  QC LO-DOSE ASPIRIN 81 MG EC tablet TAKE 1 TABLET (81 MG TOTAL) BY MOUTH DAILY. SWALLOW WHOLE. 12/11/17   [name], MD  QC STOOL SOFTENER PLS LAXATIVE 8.6-50 MG tablet TAKE 1 TABLET BY MOUTH 2 TIMES DAILY. 07/14/18   Tonia Ghent, MD  rosuvastatin (CRESTOR) 40 MG tablet TAKE 1 TABLET BY MOUTH EVERY DAY 01/03/20  Tonia Ghent, MD  senna (SENOKOT) 8.6 MG tablet Take 1 tablet by mouth 2 (two) times daily.    [provider]  tiZANidine (ZANAFLEX) 4 MG  capsule Take 1 capsule (4 mg total) by mouth as needed. 03/06/17   Sater, Nanine Means, MD  potassium chloride (K-DUR) 10 MEQ tablet TAKE 1 TABLETS BY MOUTH 2 TIMES DAILY. 12/08/18 01/28/20  Tonia Ghent, MD    Current Facility-Administered Medications  Medication Dose Route Frequency Provider Last Rate Last Admin  . acetaminophen (TYLENOL) tablet 650 mg  650 mg Oral Q6H PRN Chotiner, Yevonne Aline, MD       Or  . acetaminophen (TYLENOL) suppository 650 mg  650 mg Rectal Q6H PRN Chotiner, Yevonne Aline, MD      . cefTRIAXone (ROCEPHIN) 1 g in sodium chloride 0.9 % 100 mL IVPB  1 g Intravenous Q24H Chotiner, Yevonne Aline, MD   Stopped at 04/29/20 0827  . lactated ringers infusion   Intravenous Continuous Chotiner, Yevonne Aline, MD 75 mL/hr at 04/29/20 0840 New Bag at 04/29/20 0840  . midodrine (PROAMATINE) tablet 2.5 mg  2.5 mg Oral TID WC Chotiner, Yevonne Aline, MD   2.5 mg at 04/29/20 0758  . pantoprazole (PROTONIX) injection 40 mg  40 mg Intravenous Q12H Lavina Hamman, MD      . promethazine (PHENERGAN) tablet 12.5 mg  12.5 mg Oral Q6H PRN Chotiner, Yevonne Aline, MD      . rosuvastatin (CRESTOR) tablet 40 mg  40 mg Oral Daily Chotiner, Yevonne Aline, MD       Current Outpatient Medications  Medication Sig Dispense Refill  . acetaminophen (TYLENOL) 325 MG tablet Take 1 tablet (325 mg total) by mouth every 6 (six) hours as needed for mild pain.    . Armodafinil 200 MG TABS TAKE 1 TABLET BY MOUTH EVERY DAY IN THE MORNING 30 tablet 5  . cholecalciferol (VITAMIN D) 1000 units tablet Take 1,000 Units by mouth daily.    . [facility]S MELATONIN 10 MG CAPS TAKE 1 CAPSULE BY MOUTH AT BEDTIME AS NEEDED FOR SLEEP 90 capsule 3  . doxepin (SINEQUAN) 10 MG capsule TAKE 1 CAPSULE AT BEDTIME 90 capsule 1  . ELDERBERRY PO Take by mouth. 3 a day    . ELIQUIS 5 MG TABS tablet TAKE 1 TABLET BY MOUTH TWICE A DAY 180 tablet 2  . ferrous sulfate 325 (65 FE) MG tablet TAKE 1 TABLET TWICE A DAY WITH FOOD 180 tablet 1  . gabapentin (NEURONTIN)  300 MG capsule Take one or two at bedtime 60 capsule 11  . HYDROcodone-acetaminophen (NORCO/VICODIN) 5-325 MG tablet Take 1 tablet by mouth daily as needed for moderate pain. 30 tablet 0  . hydrocortisone (ANUSOL-HC) 25 MG suppository Place 1 suppository (25 mg total) rectally 2 (two) times daily as needed. X 1 week; then, use as needed thereafter. 24 suppository 3  . Krill Oil 1000 MG CAPS Take by mouth daily.    . lansoprazole (PREVACID) 30 MG capsule Take 1 capsule (30 mg total) by mouth 2 (two) times daily before a meal. 180 capsule 1  . midodrine (PROAMATINE) 2.5 MG tablet Take 1 tablet (2.5 mg total) by mouth 3 (three) times daily with meals. 270 tablet 3  . nitroGLYCERIN (NITROSTAT) 0.4 MG SL tablet Place 1 tablet (0.4 mg total) under the tongue every 5 (five) minutes as needed for chest pain.    . NON FORMULARY Spirulina  3 capsules once daily    . NONFORMULARY  OR COMPOUNDED ITEM Supplement menmoringa oleifera taken daily.    Marland Kitchen nystatin (MYCOSTATIN/NYSTOP) powder Apply topically 2 (two) times daily. 60 g 3  . OVER THE COUNTER MEDICATION Ceylon Cinnamon- takes 2 a day, 1200 mg    . potassium chloride (KLOR-CON M10) 10 MEQ tablet TAKE 1 TABLETS BY MOUTH 2 TIMES DAILY. 180 tablet 1  . PRESCRIPTION MEDICATION Inhale into the lungs at bedtime. CPAP    . prochlorperazine (COMPAZINE) 10 MG tablet Take 1 tablet (10 mg total) by mouth every 8 (eight) hours as needed (headache, nausea or vomiting). 10 tablet 0  . QC LO-DOSE ASPIRIN 81 MG EC tablet TAKE 1 TABLET (81 MG TOTAL) BY MOUTH DAILY. SWALLOW WHOLE. 90 tablet 3  . QC STOOL SOFTENER PLS LAXATIVE 8.6-50 MG tablet TAKE 1 TABLET BY MOUTH 2 TIMES DAILY. 60 tablet 2  . rosuvastatin (CRESTOR) 40 MG tablet TAKE 1 TABLET BY MOUTH EVERY DAY 90 tablet 3  . senna (SENOKOT) 8.6 MG tablet Take 1 tablet by mouth 2 (two) times daily.    Marland Kitchen tiZANidine (ZANAFLEX) 4 MG capsule Take 1 capsule (4 mg total) by mouth as needed. 90 capsule 3    Allergies as of  04/28/2020 - Review Complete 04/28/2020  Allergen Reaction Noted  . Diphenhydramine hcl Anaphylaxis 06/30/2011  . Adhesive [tape] Other (See Comments) 07/01/2015  . Proscar [finasteride] Other (See Comments) 11/26/2016  . Tamsulosin Other (See Comments) 09/16/2016     Review of Systems:    As per HPI, otherwise negative    Physical Exam:  Vital signs in last 24 hours: Temp:  [97.9 F (36.6 C)-98.6 F (37 C)] 97.9 F (36.6 C) (02/12 0730) Pulse Rate:  [49-115] 71 (02/12 0745) Resp:  [10-28] 17 (02/12 0745) BP: (94-121)/(53-82) 112/63 (02/12 0745) SpO2:  [97 %-100 %] 99 % (02/12 0745)   General:   Pleasant male in NAD Head:  Normocephalic and atraumatic. Eyes:   No icterus.   Conjunctiva pink. Ears:  Normal auditory acuity. Neck:  Supple Lungs:  Respirations even and unlabored. Lungs clear to auscultation bilaterally.   Heart:  Regular rate and rhythm;  Abdomen:  Soft, nondistended, protuberant, nontender.  No appreciable masses  Msk:  Symmetrical without gross deformities.  Extremities:  Without edema. Neurologic:  Alert and  oriented x4;  . Skin:  Intact without significant lesions or rashes. Psych:  Alert and cooperative. Normal affect.  LAB RESULTS: Recent Labs    04/28/20 1623  WBC 8.3  HGB 12.0*  HCT 38.3*  PLT 232   BMET Recent Labs    04/28/20 1623  NA 141  K 3.8  CL 106  CO2 27  GLUCOSE 113*  BUN 12  CREATININE 1.26*  CALCIUM 9.5   LFT Recent Labs    04/28/20 1623  PROT 6.9  ALBUMIN 2.8*  AST 26  ALT 13  ALKPHOS 64  BILITOT 0.8   PT/INR No results for input(s): LABPROT, INR in the last 72 hours.  STUDIES: CT Renal Stone Study  Result Date: 04/29/2020 CLINICAL DATA:  Hematuria. EXAM: CT ABDOMEN AND PELVIS WITHOUT CONTRAST TECHNIQUE: Multidetector CT imaging of the abdomen and pelvis was performed following the standard protocol without IV contrast. COMPARISON:  Noncontrast CT 12/04/2017 FINDINGS: Lower chest: Coronary artery  calcifications. Left basilar atelectasis. There is subpleural fat in the periphery of the left hemithorax. Hepatobiliary: Scattered punctate granuloma. No focal lesion on noncontrast exam. Gallbladder physiologically distended, no calcified stone. No biliary dilatation. Pancreas: Fatty atrophy.  No ductal dilatation  or inflammation. Spleen: Calcified granuloma.  Normal in size. Adrenals/Urinary Tract: No adrenal nodule. Obstructing 4 x 5 mm stone in the mid proximal left ureter with mild left hydronephrosis. Additional punctate nonobstructing stone in the lower left kidney. Mild left perinephric edema. Calcification in the lower right kidney is favored to be vascular rather than nonobstructing stone. Slight increased size of a 4 cm cyst in the mid right kidney, previously 3.5 cm. Smaller cyst in the lower right kidney. Urinary bladder is partially distended. Question of focal wall thickening at the left bladder base with punctate calcification, series 3, image 83. Stomach/Bowel: Decompressed stomach. No small bowel obstruction or inflammation. Scattered intraluminal densities throughout the colon suggesting ingested pills. Appendix not confidently visualized, no evidence of appendicitis. Moderate colonic stool burden. Sigmoid colonic tortuosity. No colonic inflammation. Vascular/Lymphatic: Aorto bi-iliac atherosclerosis. No aortic aneurysm. No bulky abdominopelvic adenopathy. Reproductive: Enlarged prostate gland spans 5.5 cm transverse. Other: No free air or ascites. Suspect prior umbilical hernia repair. Bilateral fat containing inguinal hernias, left greater than right. Musculoskeletal: Lumbar spondylosis. Surgical hardware in the right proximal femur. Stable peripherally sclerotic lucency about the trans trochanteric screw. No acute osseous abnormalities are seen. IMPRESSION: 1. Obstructing 4 x 5 mm stone in the mid proximal left ureter with mild left hydronephrosis. Additional punctate nonobstructing stone  suspected in the lower left kidney. 2. Questionable focal bladder wall thickening with calcification. Recommend direct visualization with cystoscopy to exclude underlying bladder lesion. 3. Enlarged prostate gland. Aortic Atherosclerosis (ICD10-I70.0). Electronically Signed   By: Keith Rake M.D.   On: 04/29/2020 01:25    CBC Latest Ref Rng & Units 04/28/2020 11/18/2019 06/17/2019  WBC 4.0 - 10.5 K/uL 8.3 6.4 7.0  Hemoglobin 13.0 - 17.0 g/dL 12.0(L) 14.7 15.1  Hematocrit 39.0 - 52.0 % 38.3(L) 44.4 46.5  Platelets 150 - 400 K/uL 232 138.0(L) 144       Impression / Plan:   70 year old male with a history of PE and CVA, on chronic Eliquis, left-sided hemiparesis, presenting with hematuria and melena with a mild anemia.  Stools occult positive.  He has not had a bowel movement since yesterday.  It certainly sounds like he may have had melena yesterday in addition to his hematuria, although BUN is normal which argues against any significant upper GI bleed. Stool is occult positive however and dark.  He does take iron supplementation at baseline but his stool is normally brown.  I discussed the situation with him, I offered him an upper endoscopy to clear his upper track in light of these findings, especially given need to resume his anticoagulation going forward.  I discussed EGD and anesthesia with him, risks of each, and he wanted to proceed.  Unfortunately he had a [place] sandwich today thus we cannot perform his procedure today.  I have him tentatively scheduled for an EGD tomorrow morning.  I think okay for clear liquid today and then n.p.o. after midnight.  Please continue Protonix 40 mg IV twice daily, continue to hold Eliquis.  Given his renal function is okay most of the Eliquis should be cleared out of his system by tomorrow.  Awaiting urology consult for renal stone.  He is otherwise hemodynamically stable at this time without any significant active bleeding.  Plan: - EGD tomorrow AM, as the  patient ate solid food this morning - continue IV protonix 40mg  BID - clear liquid diet today, NPO after MN - hold Eliquis - trend Hgb, monitor for active bleeding - please contact me  should this occur  Call with questions, will follow.  [place] Cellar, MD [facility]

## 2020-04-29 NOTE — Transfer of Care (Signed)
Immediate Anesthesia Transfer of Care Note  Patient: Mont Dutton Mcinerney  Procedure(s) Performed: CYSTOSCOPY WITH RETROGRADE PYELOGRAM/URETERAL STENT PLACEMENT (Left )  Patient Location: PACU  Anesthesia Type:General  Level of Consciousness: drowsy  Airway & Oxygen Therapy: Patient Spontanous Breathing and Patient connected to face mask oxygen  Post-op Assessment: Report given to RN and Post -op Vital signs reviewed and stable  Post vital signs: Reviewed and stable  Last Vitals:  Vitals Value Taken Time  BP 120/71 04/29/20 1603  Temp    Pulse 65 04/29/20 1603  Resp 16 04/29/20 1603  SpO2 100 % 04/29/20 1603  Vitals shown include unvalidated device data.  Last Pain:  Vitals:   04/29/20 1351  TempSrc: Oral  PainSc:          Complications: No complications documented.

## 2020-04-29 NOTE — Consult Note (Signed)
Urology Consult   Physician requesting consult: Harrold Donath, MD  Reason for consult: Left ureteral stone  History of Present Illness: Gregory Wiley is a 70 y.o. male with a history of CVA with left hemiparesis, PE requiring anticoagulation via Eliquis.  The patient presented to the [facility] emergency department with a 24-hour history of worsening left-sided flank pain along with gross hematuria and black tarry stools.  He had a CT scan on 04/29/2020 that revealed a 4 mm left mid ureteral calculus with mild to moderate left-sided hydronephrosis.  Urinalysis from yesterday showed signs of a UTI.  The patient has remained afebrile, but is hypotensive this morning.  The patient denies a history of voiding or storage urinary symptoms, hematuria, UTIs, STDs, urolithiasis, GU malignancy/trauma/surgery.  Past Medical History:  Diagnosis Date  . Adrenal insufficiency ([place])   . Anxiety   . Basal cell carcinoma of skin of left ankle   . Bleeding stomach ulcer 2015  . Coronary artery disease    stent, then CABG 07/20/10  . Daily headache    "for the past month" (11/11/2014)  . Depression   . Diverticulosis   . GERD (gastroesophageal reflux disease)   . Gout   . H/O cardiomyopathy    ischemic, now last echo 07/30/11, EF 38%W  . History of hiatal hernia   . Hyperlipidemia   . Hypertension   . Internal hemorrhoids   . Left hemiplegia ([place])   . NSTEMI (non-ST elevated myocardial infarction) ([place])    NSTEMI- last cath 06/2011-Stent to LCX-DES, last nuc 07/30/11 low risk  . Obesity (BMI 30-39.9)   . OSA on CPAP    since July 2013- uses CPAP sometimes , states he has lost 147 lbs. since stroke & doesn't use the CPAP as much as he use to.   . Plantar fasciitis of left foot   . Pneumonia    hosp. 12/2014  . Pulmonary embolism ([place]) 03/2014  . Stroke ([place]) 01/2014   "no control of LUE, can slightly LLE; memory problems since" (11/11/2014)  . Tubular adenoma of colon     Past Surgical History:   Procedure Laterality Date  . BASAL CELL CARCINOMA EXCISION Left 2015   ankle  . CARDIAC SURGERY    . CATARACT EXTRACTION Bilateral   . COLONOSCOPY N/A 02/10/2013   Procedure: COLONOSCOPY;  Surgeon: Ladene Artist, MD;  Location: [facility] ENDOSCOPY;  Service: Endoscopy;  Laterality: N/A;  . CORONARY ANGIOPLASTY WITH STENT PLACEMENT  07/01/2011   DES-Resolute to native LCX  . CORONARY ANGIOPLASTY WITH STENT PLACEMENT  12/01/2007   COMPLEX 5 LESION PCI INCLUDING CUTTING BALLOON AND 4 CYPHER DESs  . CORONARY ARTERY BYPASS GRAFT  07/20/2010    LIMA-LAD; VG-ACUTE MARG of RCA; Seq VG-distal RCA & then pda  . EP IMPLANTABLE DEVICE N/A 02/14/2016   Procedure: Loop Recorder Removal;  Surgeon: Thompson Grayer, MD;  Location: [place] CV LAB;  Service: Cardiovascular;  Laterality: N/A;  . ESOPHAGOGASTRODUODENOSCOPY  01/2014   gastric ulcer, erosive gastroduodenitis, H Pylori negative.   . ESOPHAGOGASTRODUODENOSCOPY (EGD) WITH PROPOFOL N/A 11/14/2014   Procedure: ESOPHAGOGASTRODUODENOSCOPY (EGD) WITH PROPOFOL;  Surgeon: Milus Banister, MD;  Location: [place];  Service: Endoscopy;  Laterality: N/A;  . HAND SURGERY Right    to take glass out  . HERNIA REPAIR    . INTRAMEDULLARY (IM) NAIL INTERTROCHANTERIC Right 02/07/2015   Procedure: INTRAMEDULLARY (IM) NAIL INTERTROCHANTRIC RIGHT HIP;  Surgeon: Renette Butters, MD;  Location: [place];  Service: Orthopedics;  Laterality: Right;  . KNEE ARTHROSCOPY Right   . LEFT HEART CATHETERIZATION WITH CORONARY/GRAFT ANGIOGRAM  07/01/2011   Procedure: LEFT HEART CATHETERIZATION WITH Beatrix Fetters;  Surgeon: Sanda Klein, MD;  Location: [place] CATH LAB;  Service: Cardiovascular;;  . LOOP RECORDER IMPLANT  02/15/2014   MDT LINQ implanted by Dr Rayann Heman for cryptogenic stroke  . PERCUTANEOUS CORONARY STENT INTERVENTION (PCI-S) Right 07/01/2011   Procedure: PERCUTANEOUS CORONARY STENT INTERVENTION (PCI-S);  Surgeon: Sanda Klein, MD;  Location: [facility] CATH LAB;   Service: Cardiovascular;  Laterality: Right;  . PICC LINE PLACE PERIPHERAL (Icard HX)  06/2015  . RETINAL DETACHMENT SURGERY Right   . TEE WITHOUT CARDIOVERSION N/A 02/15/2014   Procedure: TRANSESOPHAGEAL ECHOCARDIOGRAM (TEE);  Surgeon: Josue Hector, MD;  Location: [facility] ENDOSCOPY;  Service: Cardiovascular;  Laterality: N/A;  . UMBILICAL HERNIA REPAIR  2006    Current Hospital Medications:  Home Meds:  No outpatient medications have been marked as taking for the 04/28/20 encounter [facility] Encounter).    Scheduled Meds: . [START ON 04/30/2020] cosyntropin  0.25 mg Intravenous Once  . midodrine  2.5 mg Oral TID WC  . pantoprazole (PROTONIX) IV  40 mg Intravenous Q12H  . rosuvastatin  40 mg Oral Daily   Continuous Infusions: . cefTRIAXone (ROCEPHIN)  IV Stopped (04/29/20 0827)  . lactated ringers 75 mL/hr at 04/29/20 0840   PRN Meds:.acetaminophen **OR** acetaminophen, promethazine  Allergies:  Allergies  Allergen Reactions  . Diphenhydramine Hcl Anaphylaxis  . Adhesive [Tape] Other (See Comments)    Tears skin - please use paper tape  . Proscar [Finasteride] Other (See Comments)    Lightheaded.   . Tamsulosin Other (See Comments)    Dizzy, vomiting.     Family History  Problem Relation Age of Onset  . Diabetes type II Mother   . CAD Father 30       CABG, PCI, died at 65  . Heart attack Father 53  . Hypertension Brother   . Diabetes Brother   . Hyperlipidemia Brother   . Brain cancer Maternal Grandmother   . COPD Paternal Grandfather   . Coronary artery disease Other   . Other Other        denies family history of DVT/PE  . Colon cancer Neg Hx   . Stroke Neg Hx   . Prostate cancer Neg Hx     Social History:  reports that he quit smoking about 51 years ago. His smoking use included cigarettes. He has a 8.00 pack-year smoking history. He has never used smokeless tobacco. He reports that he does not drink alcohol and does not use drugs.  ROS: A complete review of  systems was performed.  All systems are negative except for pertinent findings as noted.  Physical Exam:  Vital signs in last 24 hours: Temp:  [97.9 F (36.6 C)-98.6 F (37 C)] 97.9 F (36.6 C) (02/12 0730) Pulse Rate:  [49-115] 70 (02/12 1100) Resp:  [10-28] 17 (02/12 1100) BP: (80-121)/(42-82) 102/56 (02/12 1100) SpO2:  [92 %-100 %] 92 % (02/12 1100) Constitutional:  Alert and oriented, No acute distress Cardiovascular: Regular rate and rhythm, No JVD Respiratory: Normal respiratory effort, Lungs clear bilaterally GI: Abdomen is soft, nontender, nondistended, no abdominal masses GU: No CVA tenderness Lymphatic: No lymphadenopathy Neurologic: Grossly intact, no focal deficits Psychiatric: Normal mood and affect  Laboratory Data:  Recent Labs    04/28/20 1623 04/29/20 0941  WBC 8.3 7.0  HGB 12.0* 10.0*  HCT 38.3* 32.9*  PLT 232 156  Recent Labs    04/28/20 1623 04/29/20 0941  NA 141 139  K 3.8 3.5  CL 106 107  GLUCOSE 113* 99  BUN 12 13  CALCIUM 9.5 8.4*  CREATININE 1.26* 1.26*     Results for orders placed or performed during the hospital encounter of 04/28/20 (from the past 24 hour(s))  Comprehensive metabolic panel     Status: Abnormal   Collection Time: 04/28/20  4:23 PM  Result Value Ref Range   Sodium 141 135 - 145 mmol/L   Potassium 3.8 3.5 - 5.1 mmol/L   Chloride 106 98 - 111 mmol/L   CO2 27 22 - 32 mmol/L   Glucose, Bld 113 (H) 70 - 99 mg/dL   BUN 12 8 - 23 mg/dL   Creatinine, Ser 1.26 (H) 0.61 - 1.24 mg/dL   Calcium 9.5 8.9 - 10.3 mg/dL   Total Protein 6.9 6.5 - 8.1 g/dL   Albumin 2.8 (L) 3.5 - 5.0 g/dL   AST 26 15 - 41 U/L   ALT 13 0 - 44 U/L   Alkaline Phosphatase 64 38 - 126 U/L   Total Bilirubin 0.8 0.3 - 1.2 mg/dL   GFR, Estimated >60 >60 mL/min   Anion gap 8 5 - 15  CBC     Status: Abnormal   Collection Time: 04/28/20  4:23 PM  Result Value Ref Range   WBC 8.3 4.0 - 10.5 K/uL   RBC 4.01 (L) 4.22 - 5.81 MIL/uL   Hemoglobin 12.0  (L) 13.0 - 17.0 g/dL   HCT 38.3 (L) 39.0 - 52.0 %   MCV 95.5 80.0 - 100.0 fL   MCH 29.9 26.0 - 34.0 pg   MCHC 31.3 30.0 - 36.0 g/dL   RDW 16.2 (H) 11.5 - 15.5 %   Platelets 232 150 - 400 K/uL   nRBC 0.0 0.0 - 0.2 %  Type and screen [place]     Status: None   Collection Time: 04/28/20  4:30 PM  Result Value Ref Range   ABO/RH(D) O POS    Antibody Screen NEG    Sample Expiration      05/01/2020,2359 Performed at [facility], [address] [place], [place] [address]   Urinalysis, Routine w reflex microscopic Urine, Clean Catch     Status: Abnormal   Collection Time: 04/28/20  9:40 PM  Result Value Ref Range   Color, Urine BROWN (A) YELLOW   APPearance CLOUDY (A) CLEAR   Specific Gravity, Urine >1.030 (H) 1.005 - 1.030   pH 6.5 5.0 - 8.0   Glucose, UA NEGATIVE NEGATIVE mg/dL   Hgb urine dipstick LARGE (A) NEGATIVE   Bilirubin Urine MODERATE (A) NEGATIVE   Ketones, ur NEGATIVE NEGATIVE mg/dL   Protein, ur >300 (A) NEGATIVE mg/dL   Nitrite POSITIVE (A) NEGATIVE   Leukocytes,Ua TRACE (A) NEGATIVE  Urinalysis, Microscopic (reflex)     Status: Abnormal   Collection Time: 04/28/20  9:40 PM  Result Value Ref Range   RBC / HPF >50 0 - 5 RBC/hpf   WBC, UA 6-10 0 - 5 WBC/hpf   Bacteria, UA MANY (A) NONE SEEN   Squamous Epithelial / LPF 0-5 0 - 5   Mucus PRESENT    Ca Oxalate Crys, UA PRESENT   POC occult blood, ED     Status: Abnormal   Collection Time: 04/29/20  1:12 AM  Result Value Ref Range   Fecal Occult Bld POSITIVE (A) NEGATIVE  CK  Status: None   Collection Time: 04/29/20  1:38 AM  Result Value Ref Range   Total CK 86 49 - 397 U/L  Resp Panel by RT-PCR (Flu A&B, Covid) Nasopharyngeal Swab     Status: None   Collection Time: 04/29/20  4:08 AM   Specimen: Nasopharyngeal Swab; Nasopharyngeal(NP) swabs in vial transport medium  Result Value Ref Range   SARS Coronavirus 2 by RT PCR NEGATIVE NEGATIVE   Influenza A by PCR NEGATIVE NEGATIVE    Influenza B by PCR NEGATIVE NEGATIVE  Comprehensive metabolic panel     Status: Abnormal   Collection Time: 04/29/20  9:41 AM  Result Value Ref Range   Sodium 139 135 - 145 mmol/L   Potassium 3.5 3.5 - 5.1 mmol/L   Chloride 107 98 - 111 mmol/L   CO2 24 22 - 32 mmol/L   Glucose, Bld 99 70 - 99 mg/dL   BUN 13 8 - 23 mg/dL   Creatinine, Ser 1.26 (H) 0.61 - 1.24 mg/dL   Calcium 8.4 (L) 8.9 - 10.3 mg/dL   Total Protein 5.3 (L) 6.5 - 8.1 g/dL   Albumin 2.2 (L) 3.5 - 5.0 g/dL   AST 28 15 - 41 U/L   ALT 10 0 - 44 U/L   Alkaline Phosphatase 53 38 - 126 U/L   Total Bilirubin 0.8 0.3 - 1.2 mg/dL   GFR, Estimated >60 >60 mL/min   Anion gap 8 5 - 15  CBC     Status: Abnormal   Collection Time: 04/29/20  9:41 AM  Result Value Ref Range   WBC 7.0 4.0 - 10.5 K/uL   RBC 3.39 (L) 4.22 - 5.81 MIL/uL   Hemoglobin 10.0 (L) 13.0 - 17.0 g/dL   HCT 32.9 (L) 39.0 - 52.0 %   MCV 97.1 80.0 - 100.0 fL   MCH 29.5 26.0 - 34.0 pg   MCHC 30.4 30.0 - 36.0 g/dL   RDW 15.9 (H) 11.5 - 15.5 %   Platelets 156 150 - 400 K/uL   nRBC 0.0 0.0 - 0.2 %   Recent Results (from the past 240 hour(s))  Resp Panel by RT-PCR (Flu A&B, Covid) Nasopharyngeal Swab     Status: None   Collection Time: 04/29/20  4:08 AM   Specimen: Nasopharyngeal Swab; Nasopharyngeal(NP) swabs in vial transport medium  Result Value Ref Range Status   SARS Coronavirus 2 by RT PCR NEGATIVE NEGATIVE Final    Comment: (NOTE) SARS-CoV-2 target nucleic acids are NOT DETECTED.  The SARS-CoV-2 RNA is generally detectable in upper respiratory specimens during the acute phase of infection. The lowest concentration of SARS-CoV-2 viral copies this assay can detect is 138 copies/mL. A negative result does not preclude SARS-Cov-2 infection and should not be used as the sole basis for treatment or other patient management decisions. A negative result may occur with  improper specimen collection/handling, submission of specimen other than nasopharyngeal  swab, presence of viral mutation(s) within the areas targeted by this assay, and inadequate number of viral copies(<138 copies/mL). A negative result must be combined with clinical observations, patient history, and epidemiological information. The expected result is Negative.  Fact Sheet for Patients:  EntrepreneurPulse.com.au  Fact Sheet for Healthcare Providers:  IncredibleEmployment.be  This test is no t yet approved or cleared by the [place] FDA and  has been authorized for detection and/or diagnosis of SARS-CoV-2 by FDA under an Emergency Use Authorization (EUA). This EUA will remain  in effect (meaning this test can be used)  for the duration of the COVID-19 declaration under Section 564(b)(1) of the Act, 21 U.S.C.section 360bbb-3(b)(1), unless the authorization is terminated  or revoked sooner.       Influenza A by PCR NEGATIVE NEGATIVE Final   Influenza B by PCR NEGATIVE NEGATIVE Final    Comment: (NOTE) The Xpert Xpress SARS-CoV-2/FLU/RSV plus assay is intended as an aid in the diagnosis of influenza from Nasopharyngeal swab specimens and should not be used as a sole basis for treatment. Nasal washings and aspirates are unacceptable for Xpert Xpress SARS-CoV-2/FLU/RSV testing.  Fact Sheet for Patients: EntrepreneurPulse.com.au  Fact Sheet for Healthcare Providers: IncredibleEmployment.be  This test is not yet approved or cleared by the [place] FDA and has been authorized for detection and/or diagnosis of SARS-CoV-2 by FDA under an Emergency Use Authorization (EUA). This EUA will remain in effect (meaning this test can be used) for the duration of the COVID-19 declaration under Section 564(b)(1) of the Act, 21 U.S.C. section 360bbb-3(b)(1), unless the authorization is terminated or revoked.  Performed at [facility], [place] [address], [place], [place] [address]     Renal  Function: Recent Labs    04/28/20 1623 04/29/20 0941  CREATININE 1.26* 1.26*   Estimated Creatinine Clearance: 70.4 mL/min (A) (by C-G formula based on SCr of 1.26 mg/dL (H)).  Radiologic Imaging: CT Renal Stone Study  Result Date: 04/29/2020 CLINICAL DATA:  Hematuria. EXAM: CT ABDOMEN AND PELVIS WITHOUT CONTRAST TECHNIQUE: Multidetector CT imaging of the abdomen and pelvis was performed following the standard protocol without IV contrast. COMPARISON:  Noncontrast CT 12/04/2017 FINDINGS: Lower chest: Coronary artery calcifications. Left basilar atelectasis. There is subpleural fat in the periphery of the left hemithorax. Hepatobiliary: Scattered punctate granuloma. No focal lesion on noncontrast exam. Gallbladder physiologically distended, no calcified stone. No biliary dilatation. Pancreas: Fatty atrophy.  No ductal dilatation or inflammation. Spleen: Calcified granuloma.  Normal in size. Adrenals/Urinary Tract: No adrenal nodule. Obstructing 4 x 5 mm stone in the mid proximal left ureter with mild left hydronephrosis. Additional punctate nonobstructing stone in the lower left kidney. Mild left perinephric edema. Calcification in the lower right kidney is favored to be vascular rather than nonobstructing stone. Slight increased size of a 4 cm cyst in the mid right kidney, previously 3.5 cm. Smaller cyst in the lower right kidney. Urinary bladder is partially distended. Question of focal wall thickening at the left bladder base with punctate calcification, series 3, image 83. Stomach/Bowel: Decompressed stomach. No small bowel obstruction or inflammation. Scattered intraluminal densities throughout the colon suggesting ingested pills. Appendix not confidently visualized, no evidence of appendicitis. Moderate colonic stool burden. Sigmoid colonic tortuosity. No colonic inflammation. Vascular/Lymphatic: Aorto bi-iliac atherosclerosis. No aortic aneurysm. No bulky abdominopelvic adenopathy. Reproductive:  Enlarged prostate gland spans 5.5 cm transverse. Other: No free air or ascites. Suspect prior umbilical hernia repair. Bilateral fat containing inguinal hernias, left greater than right. Musculoskeletal: Lumbar spondylosis. Surgical hardware in the right proximal femur. Stable peripherally sclerotic lucency about the trans trochanteric screw. No acute osseous abnormalities are seen. IMPRESSION: 1. Obstructing 4 x 5 mm stone in the mid proximal left ureter with mild left hydronephrosis. Additional punctate nonobstructing stone suspected in the lower left kidney. 2. Questionable focal bladder wall thickening with calcification. Recommend direct visualization with cystoscopy to exclude underlying bladder lesion. 3. Enlarged prostate gland. Aortic Atherosclerosis (ICD10-I70.0). Electronically Signed   By: Keith Rake M.D.   On: 04/29/2020 01:25    I independently reviewed the above imaging studies.  Impression/Recommendation 70 year old male with an obstructing 4 mm left mid ureteral calculus with concomitant left-sided hydronephrosis and gross hematuria  -The risks, benefits and alternatives of cystoscopy with LEFT JJ stent placement was discussed with the patient.  Risks include, but are not limited to: bleeding, urinary tract infection, ureteral injury, ureteral stricture disease, chronic pain, urinary symptoms, bladder injury, stent migration, the need for nephrostomy tube placement, MI, CVA, DVT, PE and the inherent risks with general anesthesia.  The patient voices understanding and wishes to proceed.  -Urine culture pending.  Continue IV Rocephin  Ellison Hughs, MD [facility] 04/29/2020, 11:23 AM

## 2020-04-29 NOTE — Consult Note (Signed)
Consultation  Referring Provider:     Harrold Donath Primary Care Physician:  Tonia Ghent, MD Primary Gastroenterologist:     Lucio Edward     Reason for Consultation:     Melena, anemia         HPI:   Gregory Wiley is a 70 y.o. male with a remarkable history of stroke with left hemiparesis, history of peptic ulcer disease in 2015, history of pulmonary embolism, on chronic Eliquis, who presented to the emergency room yesterday with hematuria.  He states he developed acute onset hematuria yesterday, has never had a history of kidney stones.  He also endorses that he had 2 black tarry bowel movements yesterday afternoon which is new for him.  He does take oral iron supplement once daily but states his stools are normally brown.  He does have some baseline constipation but otherwise no routine bleeding symptoms.  He does not really have any abdominal pain at all today but states he had some nausea yesterday but without any vomiting.  His last dose of Eliquis was yesterday afternoon.  His hemoglobin was found to be 12 in the ED, down from normal hemoglobin at baseline, with heme positive stool.  He had a CT scan done which showed a 5 mm obstructing renal stone in the mid proximal left ureter causing mild hydronephrosis on the left.  His GFR is greater than 60.  He states his last bowel movement was yesterday afternoon, he has not passedanything since that time.  He denies any cardiopulmonary symptoms at this time.  He denies any problems with anesthesia historically.  He denies any NSAID use.  He states he takes omeprazole for history of GERD and this typically controls his symptoms pretty well.  He denies any history of problems with ulcers or GI bleeding since 2015.  He had a follow-up endoscopy in August 2016 which confirmed healing of gastric ulcer.  His last colonoscopy was in 2014 at which time he had 2 small polyps and some diverticulosis.  Unfortunately he ate a [place] sandwich this  morning prior to my evaluation, is not been n.p.o.   Prior work-up: EGD 10/2014 -hiatal hernia, mild Schatzki ring at the [place], gastritis  EGD 01/2014 -antral ulcer  Colonoscopy 01/2013 -2 small polyps, diverticulosis  Echocardiogram 01/2020 -EF 76%, grade 1 diastolic dysfunction    Past Medical History:  Diagnosis Date  . Adrenal insufficiency ([place])   . Anxiety   . Basal cell carcinoma of skin of left ankle   . Bleeding stomach ulcer 2015  . Coronary artery disease    stent, then CABG 07/20/10  . Daily headache    "for the past month" (11/11/2014)  . Depression   . Diverticulosis   . GERD (gastroesophageal reflux disease)   . Gout   . H/O cardiomyopathy    ischemic, now last echo 07/30/11, EF 38%W  . History of hiatal hernia   . Hyperlipidemia   . Hypertension   . Internal hemorrhoids   . Left hemiplegia ([place])   . NSTEMI (non-ST elevated myocardial infarction) ([place])    NSTEMI- last cath 06/2011-Stent to LCX-DES, last nuc 07/30/11 low risk  . Obesity (BMI 30-39.9)   . OSA on CPAP    since July 2013- uses CPAP sometimes , states he has lost 147 lbs. since stroke & doesn't use the CPAP as much as he use to.   . Plantar fasciitis of left foot   . Pneumonia  hosp. 12/2014  . Pulmonary embolism ([place]) 03/2014  . Stroke ([place]) 01/2014   "no control of LUE, can slightly LLE; memory problems since" (11/11/2014)  . Tubular adenoma of colon     Past Surgical History:  Procedure Laterality Date  . BASAL CELL CARCINOMA EXCISION Left 2015   ankle  . CARDIAC SURGERY    . CATARACT EXTRACTION Bilateral   . COLONOSCOPY N/A 02/10/2013   Procedure: COLONOSCOPY;  Surgeon: Ladene Artist, MD;  Location: [facility];  Service: Endoscopy;  Laterality: N/A;  . CORONARY ANGIOPLASTY WITH STENT PLACEMENT  07/01/2011   DES-Resolute to native LCX  . CORONARY ANGIOPLASTY WITH STENT PLACEMENT  12/01/2007   COMPLEX 5 LESION PCI INCLUDING CUTTING BALLOON AND 4 CYPHER DESs  . CORONARY ARTERY BYPASS  GRAFT  07/20/2010    LIMA-LAD; VG-ACUTE MARG of RCA; Seq VG-distal RCA & then pda  . EP IMPLANTABLE DEVICE N/A 02/14/2016   Procedure: Loop Recorder Removal;  Surgeon: Thompson Grayer, MD;  Location: [facility];  Service: Cardiovascular;  Laterality: N/A;  . ESOPHAGOGASTRODUODENOSCOPY  01/2014   gastric ulcer, erosive gastroduodenitis, H Pylori negative.   . ESOPHAGOGASTRODUODENOSCOPY (EGD) WITH PROPOFOL N/A 11/14/2014   Procedure: ESOPHAGOGASTRODUODENOSCOPY (EGD) WITH PROPOFOL;  Surgeon: Milus Banister, MD;  Location: [facility];  Service: Endoscopy;  Laterality: N/A;  . HAND SURGERY Right    to take glass out  . HERNIA REPAIR    . INTRAMEDULLARY (IM) NAIL INTERTROCHANTERIC Right 02/07/2015   Procedure: INTRAMEDULLARY (IM) NAIL INTERTROCHANTRIC RIGHT HIP;  Surgeon: Renette Butters, MD;  Location: [facility];  Service: Orthopedics;  Laterality: Right;  . KNEE ARTHROSCOPY Right   . LEFT HEART CATHETERIZATION WITH CORONARY/GRAFT ANGIOGRAM  07/01/2011   Procedure: LEFT HEART CATHETERIZATION WITH Beatrix Fetters;  Surgeon: Sanda Klein, MD;  Location: [facility];  Service: Cardiovascular;;  . LOOP RECORDER IMPLANT  02/15/2014   MDT LINQ implanted by Dr Rayann Heman for cryptogenic stroke  . PERCUTANEOUS CORONARY STENT INTERVENTION (PCI-S) Right 07/01/2011   Procedure: PERCUTANEOUS CORONARY STENT INTERVENTION (PCI-S);  Surgeon: Sanda Klein, MD;  Location: [facility] CATH LAB;  Service: Cardiovascular;  Laterality: Right;  . PICC LINE PLACE PERIPHERAL ([place] HX)  06/2015  . RETINAL DETACHMENT SURGERY Right   . TEE WITHOUT CARDIOVERSION N/A 02/15/2014   Procedure: TRANSESOPHAGEAL ECHOCARDIOGRAM (TEE);  Surgeon: Josue Hector, MD;  Location: [facility];  Service: Cardiovascular;  Laterality: N/A;  . UMBILICAL HERNIA REPAIR  2006    Family History  Problem Relation Age of Onset  . Diabetes type II Mother   . CAD Father 69       CABG, PCI, died at 77  . Heart attack Father 33  . Hypertension  Brother   . Diabetes Brother   . Hyperlipidemia Brother   . Brain cancer Maternal Grandmother   . COPD Paternal Grandfather   . Coronary artery disease Other   . Other Other        denies family history of DVT/PE  . Colon cancer Neg Hx   . Stroke Neg Hx   . Prostate cancer Neg Hx      Social History   Tobacco Use  . Smoking status: Former Smoker    Packs/day: 2.00    Years: 4.00    Pack years: 8.00    Types: Cigarettes    Quit date: 01/30/1969    Years since quitting: 51.2  . Smokeless tobacco: Never Used  . Tobacco comment: "quit smoking cigarettes  in the 1970's"  Substance Use Topics  . Alcohol use: No    Alcohol/week: 0.0 standard drinks  . Drug use: No    Comment: "smoked some pot in college"    Prior to Admission medications   Medication Sig Start Date End Date Taking? Authorizing Provider  acetaminophen (TYLENOL) 325 MG tablet Take 1 tablet (325 mg total) by mouth every 6 (six) hours as needed for mild pain. 12/27/14   Florencia Reasons, MD  Armodafinil 200 MG TABS TAKE 1 TABLET BY MOUTH EVERY DAY IN THE MORNING 11/29/19   Sater, Nanine Means, MD  cholecalciferol (VITAMIN D) 1000 units tablet Take 1,000 Units by mouth daily.    [provider]  [facility] MELATONIN 10 MG CAPS TAKE 1 CAPSULE BY MOUTH AT BEDTIME AS NEEDED FOR SLEEP 08/30/19   Tonia Ghent, MD  doxepin (SINEQUAN) 10 MG capsule TAKE 1 CAPSULE AT BEDTIME 11/29/19   Sater, Nanine Means, MD  ELDERBERRY PO Take by mouth. 3 a day    [provider]  ELIQUIS 5 MG TABS tablet TAKE 1 TABLET BY MOUTH TWICE A DAY 12/15/19   [name], MD  ferrous sulfate 325 (65 FE) MG tablet TAKE 1 TABLET TWICE A DAY WITH FOOD 02/17/20   Tonia Ghent, MD  gabapentin (NEURONTIN) 300 MG capsule Take one or two at bedtime 07/14/19   Sater, Nanine Means, MD  HYDROcodone-acetaminophen (NORCO/VICODIN) 5-325 MG tablet Take 1 tablet by mouth daily as needed for moderate pain. 12/29/19   Sater, Nanine Means, MD  hydrocortisone (ANUSOL-HC)  25 MG suppository Place 1 suppository (25 mg total) rectally 2 (two) times daily as needed. X 1 week; then, use as needed thereafter. 07/07/19   Tonia Ghent, MD  Krill Oil 1000 MG CAPS Take by mouth daily.    [provider]  lansoprazole (PREVACID) 30 MG capsule Take 1 capsule (30 mg total) by mouth 2 (two) times daily before a meal. 07/04/19   Tonia Ghent, MD  midodrine (PROAMATINE) 2.5 MG tablet Take 1 tablet (2.5 mg total) by mouth 3 (three) times daily with meals. 04/26/20   [name], MD  nitroGLYCERIN (NITROSTAT) 0.4 MG SL tablet Place 1 tablet (0.4 mg total) under the tongue every 5 (five) minutes as needed for chest pain. 07/18/17   Tonia Ghent, MD  NON FORMULARY Spirulina  3 capsules once daily    [provider]  NONFORMULARY OR COMPOUNDED ITEM Supplement menmoringa oleifera taken daily.    [provider]  nystatin (MYCOSTATIN/NYSTOP) powder Apply topically 2 (two) times daily. 07/18/17   Tonia Ghent, MD  OVER THE COUNTER MEDICATION Ardath Sax- takes 2 a day, 1200 mg    [provider]  potassium chloride (KLOR-CON M10) 10 MEQ tablet TAKE 1 TABLETS BY MOUTH 2 TIMES DAILY. 01/28/20   Tonia Ghent, MD  PRESCRIPTION MEDICATION Inhale into the lungs at bedtime. CPAP    [provider]  prochlorperazine (COMPAZINE) 10 MG tablet Take 1 tablet (10 mg total) by mouth every 8 (eight) hours as needed (headache, nausea or vomiting). 03/19/16   Forde Dandy, MD  QC LO-DOSE ASPIRIN 81 MG EC tablet TAKE 1 TABLET (81 MG TOTAL) BY MOUTH DAILY. SWALLOW WHOLE. 12/11/17   [name], MD  QC STOOL SOFTENER PLS LAXATIVE 8.6-50 MG tablet TAKE 1 TABLET BY MOUTH 2 TIMES DAILY. 07/14/18   Tonia Ghent, MD  rosuvastatin (CRESTOR) 40 MG tablet TAKE 1 TABLET BY MOUTH EVERY DAY 01/03/20  Tonia Ghent, MD  senna (SENOKOT) 8.6 MG tablet Take 1 tablet by mouth 2 (two) times daily.    [provider]  tiZANidine (ZANAFLEX) 4 MG  capsule Take 1 capsule (4 mg total) by mouth as needed. 03/06/17   Sater, Nanine Means, MD  potassium chloride (K-DUR) 10 MEQ tablet TAKE 1 TABLETS BY MOUTH 2 TIMES DAILY. 12/08/18 01/28/20  Tonia Ghent, MD    Current Facility-Administered Medications  Medication Dose Route Frequency Provider Last Rate Last Admin  . acetaminophen (TYLENOL) tablet 650 mg  650 mg Oral Q6H PRN Chotiner, Yevonne Aline, MD       Or  . acetaminophen (TYLENOL) suppository 650 mg  650 mg Rectal Q6H PRN Chotiner, Yevonne Aline, MD      . cefTRIAXone (ROCEPHIN) 1 g in sodium chloride 0.9 % 100 mL IVPB  1 g Intravenous Q24H Chotiner, Yevonne Aline, MD   Stopped at 04/29/20 0827  . lactated ringers infusion   Intravenous Continuous Chotiner, Yevonne Aline, MD 75 mL/hr at 04/29/20 0840 New Bag at 04/29/20 0840  . midodrine (PROAMATINE) tablet 2.5 mg  2.5 mg Oral TID WC Chotiner, Yevonne Aline, MD   2.5 mg at 04/29/20 0758  . pantoprazole (PROTONIX) injection 40 mg  40 mg Intravenous Q12H Lavina Hamman, MD      . promethazine (PHENERGAN) tablet 12.5 mg  12.5 mg Oral Q6H PRN Chotiner, Yevonne Aline, MD      . rosuvastatin (CRESTOR) tablet 40 mg  40 mg Oral Daily Chotiner, Yevonne Aline, MD       Current Outpatient Medications  Medication Sig Dispense Refill  . acetaminophen (TYLENOL) 325 MG tablet Take 1 tablet (325 mg total) by mouth every 6 (six) hours as needed for mild pain.    . Armodafinil 200 MG TABS TAKE 1 TABLET BY MOUTH EVERY DAY IN THE MORNING 30 tablet 5  . cholecalciferol (VITAMIN D) 1000 units tablet Take 1,000 Units by mouth daily.    . CVS MELATONIN 10 MG CAPS TAKE 1 CAPSULE BY MOUTH AT BEDTIME AS NEEDED FOR SLEEP 90 capsule 3  . doxepin (SINEQUAN) 10 MG capsule TAKE 1 CAPSULE AT BEDTIME 90 capsule 1  . ELDERBERRY PO Take by mouth. 3 a day    . ELIQUIS 5 MG TABS tablet TAKE 1 TABLET BY MOUTH TWICE A DAY 180 tablet 2  . ferrous sulfate 325 (65 FE) MG tablet TAKE 1 TABLET TWICE A DAY WITH FOOD 180 tablet 1  . gabapentin (NEURONTIN)  300 MG capsule Take one or two at bedtime 60 capsule 11  . HYDROcodone-acetaminophen (NORCO/VICODIN) 5-325 MG tablet Take 1 tablet by mouth daily as needed for moderate pain. 30 tablet 0  . hydrocortisone (ANUSOL-HC) 25 MG suppository Place 1 suppository (25 mg total) rectally 2 (two) times daily as needed. X 1 week; then, use as needed thereafter. 24 suppository 3  . Krill Oil 1000 MG CAPS Take by mouth daily.    . lansoprazole (PREVACID) 30 MG capsule Take 1 capsule (30 mg total) by mouth 2 (two) times daily before a meal. 180 capsule 1  . midodrine (PROAMATINE) 2.5 MG tablet Take 1 tablet (2.5 mg total) by mouth 3 (three) times daily with meals. 270 tablet 3  . nitroGLYCERIN (NITROSTAT) 0.4 MG SL tablet Place 1 tablet (0.4 mg total) under the tongue every 5 (five) minutes as needed for chest pain.    . NON FORMULARY Spirulina  3 capsules once daily    . NONFORMULARY  OR COMPOUNDED ITEM Supplement menmoringa oleifera taken daily.    . nystatin (MYCOSTATIN/NYSTOP) powder Apply topically 2 (two) times daily. 60 g 3  . OVER THE COUNTER MEDICATION Ceylon Cinnamon- takes 2 a day, 1200 mg    . potassium chloride (KLOR-CON M10) 10 MEQ tablet TAKE 1 TABLETS BY MOUTH 2 TIMES DAILY. 180 tablet 1  . PRESCRIPTION MEDICATION Inhale into the lungs at bedtime. CPAP    . prochlorperazine (COMPAZINE) 10 MG tablet Take 1 tablet (10 mg total) by mouth every 8 (eight) hours as needed (headache, nausea or vomiting). 10 tablet 0  . QC LO-DOSE ASPIRIN 81 MG EC tablet TAKE 1 TABLET (81 MG TOTAL) BY MOUTH DAILY. SWALLOW WHOLE. 90 tablet 3  . QC STOOL SOFTENER PLS LAXATIVE 8.6-50 MG tablet TAKE 1 TABLET BY MOUTH 2 TIMES DAILY. 60 tablet 2  . rosuvastatin (CRESTOR) 40 MG tablet TAKE 1 TABLET BY MOUTH EVERY DAY 90 tablet 3  . senna (SENOKOT) 8.6 MG tablet Take 1 tablet by mouth 2 (two) times daily.    . tiZANidine (ZANAFLEX) 4 MG capsule Take 1 capsule (4 mg total) by mouth as needed. 90 capsule 3    Allergies as of  04/28/2020 - Review Complete 04/28/2020  Allergen Reaction Noted  . Diphenhydramine hcl Anaphylaxis 06/30/2011  . Adhesive [tape] Other (See Comments) 07/01/2015  . Proscar [finasteride] Other (See Comments) 11/26/2016  . Tamsulosin Other (See Comments) 09/16/2016     Review of Systems:    As per HPI, otherwise negative    Physical Exam:  Vital signs in last 24 hours: Temp:  [97.9 F (36.6 C)-98.6 F (37 C)] 97.9 F (36.6 C) (02/12 0730) Pulse Rate:  [49-115] 71 (02/12 0745) Resp:  [10-28] 17 (02/12 0745) BP: (94-121)/(53-82) 112/63 (02/12 0745) SpO2:  [97 %-100 %] 99 % (02/12 0745)   General:   Pleasant male in NAD Head:  Normocephalic and atraumatic. Eyes:   No icterus.   Conjunctiva pink. Ears:  Normal auditory acuity. Neck:  Supple Lungs:  Respirations even and unlabored. Lungs clear to auscultation bilaterally.   Heart:  Regular rate and rhythm;  Abdomen:  Soft, nondistended, protuberant, nontender.  No appreciable masses  Msk:  Symmetrical without gross deformities.  Extremities:  Without edema. Neurologic:  Alert and  oriented x4;  . Skin:  Intact without significant lesions or rashes. Psych:  Alert and cooperative. Normal affect.  LAB RESULTS: Recent Labs    04/28/20 1623  WBC 8.3  HGB 12.0*  HCT 38.3*  PLT 232   BMET Recent Labs    04/28/20 1623  NA 141  K 3.8  CL 106  CO2 27  GLUCOSE 113*  BUN 12  CREATININE 1.26*  CALCIUM 9.5   LFT Recent Labs    04/28/20 1623  PROT 6.9  ALBUMIN 2.8*  AST 26  ALT 13  ALKPHOS 64  BILITOT 0.8   PT/INR No results for input(s): LABPROT, INR in the last 72 hours.  STUDIES: CT Renal Stone Study  Result Date: 04/29/2020 CLINICAL DATA:  Hematuria. EXAM: CT ABDOMEN AND PELVIS WITHOUT CONTRAST TECHNIQUE: Multidetector CT imaging of the abdomen and pelvis was performed following the standard protocol without IV contrast. COMPARISON:  Noncontrast CT 12/04/2017 FINDINGS: Lower chest: Coronary artery  calcifications. Left basilar atelectasis. There is subpleural fat in the periphery of the left hemithorax. Hepatobiliary: Scattered punctate granuloma. No focal lesion on noncontrast exam. Gallbladder physiologically distended, no calcified stone. No biliary dilatation. Pancreas: Fatty atrophy.  No ductal dilatation   or inflammation. Spleen: Calcified granuloma.  Normal in size. Adrenals/Urinary Tract: No adrenal nodule. Obstructing 4 x 5 mm stone in the mid proximal left ureter with mild left hydronephrosis. Additional punctate nonobstructing stone in the lower left kidney. Mild left perinephric edema. Calcification in the lower right kidney is favored to be vascular rather than nonobstructing stone. Slight increased size of a 4 cm cyst in the mid right kidney, previously 3.5 cm. Smaller cyst in the lower right kidney. Urinary bladder is partially distended. Question of focal wall thickening at the left bladder base with punctate calcification, series 3, image 83. Stomach/Bowel: Decompressed stomach. No small bowel obstruction or inflammation. Scattered intraluminal densities throughout the colon suggesting ingested pills. Appendix not confidently visualized, no evidence of appendicitis. Moderate colonic stool burden. Sigmoid colonic tortuosity. No colonic inflammation. Vascular/Lymphatic: Aorto bi-iliac atherosclerosis. No aortic aneurysm. No bulky abdominopelvic adenopathy. Reproductive: Enlarged prostate gland spans 5.5 cm transverse. Other: No free air or ascites. Suspect prior umbilical hernia repair. Bilateral fat containing inguinal hernias, left greater than right. Musculoskeletal: Lumbar spondylosis. Surgical hardware in the right proximal femur. Stable peripherally sclerotic lucency about the trans trochanteric screw. No acute osseous abnormalities are seen. IMPRESSION: 1. Obstructing 4 x 5 mm stone in the mid proximal left ureter with mild left hydronephrosis. Additional punctate nonobstructing stone  suspected in the lower left kidney. 2. Questionable focal bladder wall thickening with calcification. Recommend direct visualization with cystoscopy to exclude underlying bladder lesion. 3. Enlarged prostate gland. Aortic Atherosclerosis (ICD10-I70.0). Electronically Signed   By: Keith Rake M.D.   On: 04/29/2020 01:25    CBC Latest Ref Rng & Units 04/28/2020 11/18/2019 06/17/2019  WBC 4.0 - 10.5 K/uL 8.3 6.4 7.0  Hemoglobin 13.0 - 17.0 g/dL 12.0(L) 14.7 15.1  Hematocrit 39.0 - 52.0 % 38.3(L) 44.4 46.5  Platelets 150 - 400 K/uL 232 138.0(L) 144       Impression / Plan:   70 year old male with a history of PE and CVA, on chronic Eliquis, left-sided hemiparesis, presenting with hematuria and melena with a mild anemia.  Stools occult positive.  He has not had a bowel movement since yesterday.  It certainly sounds like he may have had melena yesterday in addition to his hematuria, although BUN is normal which argues against any significant upper GI bleed. Stool is occult positive however and dark.  He does take iron supplementation at baseline but his stool is normally brown.  I discussed the situation with him, I offered him an upper endoscopy to clear his upper track in light of these findings, especially given need to resume his anticoagulation going forward.  I discussed EGD and anesthesia with him, risks of each, and he wanted to proceed.  Unfortunately he had a [place] sandwich today thus we cannot perform his procedure today.  I have him tentatively scheduled for an EGD tomorrow morning.  I think okay for clear liquid today and then n.p.o. after midnight.  Please continue Protonix 40 mg IV twice daily, continue to hold Eliquis.  Given his renal function is okay most of the Eliquis should be cleared out of his system by tomorrow.  Awaiting urology consult for renal stone.  He is otherwise hemodynamically stable at this time without any significant active bleeding.  Plan: - EGD tomorrow AM, as the  patient ate solid food this morning - continue IV protonix 40mg  BID - clear liquid diet today, NPO after MN - hold Eliquis - trend Hgb, monitor for active bleeding - please contact me  should this occur  Call with questions, will follow.  Culbertson Cellar, MD [facility]

## 2020-04-29 NOTE — Progress Notes (Signed)
Pt admitted to [id number] from ED via bed.  Oriented to room and dept.  Pt is NPO for ureteral stent placement by URO, permit signed.  Last food was a sandwich at 0600 this am.

## 2020-04-29 NOTE — Progress Notes (Signed)
Received pt back from PACU with foley catheter in place. Will order clear liquids for dinner, then NPO p MN as per GI note.

## 2020-04-30 ENCOUNTER — Encounter (HOSPITAL_COMMUNITY): Payer: Self-pay | Admitting: Family Medicine

## 2020-04-30 ENCOUNTER — Inpatient Hospital Stay (HOSPITAL_COMMUNITY): Payer: Medicare Other | Admitting: Anesthesiology

## 2020-04-30 ENCOUNTER — Inpatient Hospital Stay (HOSPITAL_COMMUNITY): Payer: Medicare Other

## 2020-04-30 ENCOUNTER — Encounter (HOSPITAL_COMMUNITY)
Admission: EM | Disposition: A | Discharge status: Skilled Nursing Facility | Payer: Self-pay | Source: Home / Self Care | Attending: Internal Medicine

## 2020-04-30 DIAGNOSIS — K3189 Other diseases of stomach and duodenum: Secondary | ICD-10-CM

## 2020-04-30 DIAGNOSIS — R609 Edema, unspecified: Secondary | ICD-10-CM | POA: Diagnosis not present

## 2020-04-30 DIAGNOSIS — K222 Esophageal obstruction: Secondary | ICD-10-CM

## 2020-04-30 HISTORY — PX: ESOPHAGOGASTRODUODENOSCOPY (EGD) WITH PROPOFOL: SHX5813

## 2020-04-30 HISTORY — PX: HOT HEMOSTASIS: SHX5433

## 2020-04-30 LAB — URINE CULTURE: Culture: NO GROWTH

## 2020-04-30 SURGERY — ESOPHAGOGASTRODUODENOSCOPY (EGD) WITH PROPOFOL
Anesthesia: Monitor Anesthesia Care

## 2020-04-30 MED ORDER — PEG-KCL-NACL-NASULF-NA ASC-C 100 G PO SOLR: 1.0000 | Freq: Once | ORAL | Status: DC

## 2020-04-30 MED ORDER — PROPOFOL 500 MG/50ML IV EMUL
INTRAVENOUS | Status: DC | PRN
  Administered 2020-04-30: 50 ug/kg/min via INTRAVENOUS

## 2020-04-30 MED ORDER — COSYNTROPIN 0.25 MG IJ SOLR: 0.2500 mg | Freq: Once | INTRAMUSCULAR | Status: DC

## 2020-04-30 MED ORDER — LIDOCAINE 2% (20 MG/ML) 5 ML SYRINGE
INTRAMUSCULAR | Status: DC | PRN
  Administered 2020-04-30: 40 mg via INTRAVENOUS

## 2020-04-30 MED ORDER — PEG-KCL-NACL-NASULF-NA ASC-C 100 G PO SOLR
0.5000 | Freq: Once | ORAL | Status: AC
  Administered 2020-05-01: 100 g via ORAL

## 2020-04-30 MED ORDER — PEG-KCL-NACL-NASULF-NA ASC-C 100 G PO SOLR
0.5000 | Freq: Once | ORAL | Status: AC
  Administered 2020-04-30: 100 g via ORAL
  Filled 2020-04-30 (×2): qty 1

## 2020-04-30 MED ORDER — COSYNTROPIN 0.25 MG IJ SOLR
0.2500 mg | Freq: Once | INTRAMUSCULAR | Status: DC
  Filled 2020-04-30: qty 0.25

## 2020-04-30 SURGICAL SUPPLY — 15 items

## 2020-04-30 NOTE — Anesthesia Preprocedure Evaluation (Addendum)
Anesthesia Evaluation    Reviewed: Allergy & Precautions, Patient's Chart, lab work & pertinent test results  Airway        Dental   Pulmonary sleep apnea and Continuous Positive Airway Pressure Ventilation , former smoker, PE          Cardiovascular hypertension, + CAD, + Past MI, + Cardiac Stents, + CABG (2012) and +CHF  + Valvular Problems/Murmurs AI   TTE 2021 1. Left ventricular ejection fraction, by estimation, is 50%. The left  ventricle has low normal function. The left ventricle demonstrates  regional wall motion abnormalities (see scoring diagram/findings for  description). Left ventricular diastolic  parameters are consistent with Grade I diastolic dysfunction (impaired  relaxation).  2. Right ventricular systolic function is mildly reduced. The right  ventricular size is normal. Tricuspid regurgitation signal is inadequate  for assessing PA pressure.  3. The mitral valve is grossly normal. Trivial mitral valve  regurgitation. No evidence of mitral stenosis.  4. The aortic valve is abnormal. There is moderate calcification of the  aortic valve. Aortic valve regurgitation is mild to moderate. Mild aortic  valve stenosis. Aortic valve mean gradient measures 11.0 mmHg.  5. Aortic dilatation noted. There is mild dilatation of the aortic root  and of the ascending aorta, measuring 42 mm.  Stress Test 2020  The left ventricular ejection fraction is moderately decreased (30-44%).  Nuclear stress EF: 34%.  There was no ST segment deviation noted during stress.  Defect 1: There is a large defect of severe severity present in the basal inferolateral, mid inferolateral, apical anterior, apical septal, apical inferior, apical lateral and apex location.  Findings consistent with prior myocardial infarction.  This is an intermediate risk study.     Neuro/Psych  Headaches, PSYCHIATRIC DISORDERS Anxiety Depression CVA  (left sided hemiparesis), Residual Symptoms    GI/Hepatic Neg liver ROS, PUD, GERD  Medicated,  Endo/Other  negative endocrine ROS  Renal/GU Renal InsufficiencyRenal disease  negative genitourinary   Musculoskeletal negative musculoskeletal ROS (+)   Abdominal   Peds  Hematology  (+) Blood dyscrasia (on eliquis, Hgb 10), anemia ,   Anesthesia Other Findings   Reproductive/Obstetrics                             Anesthesia Physical Anesthesia Plan  ASA: III  Anesthesia Plan: MAC   Post-op Pain Management:    Induction: Intravenous  PONV Risk Score and Plan: Propofol infusion and Treatment may vary due to age or medical condition  Airway Management Planned: Natural Airway  Additional Equipment:   Intra-op Plan:   Post-operative Plan:   Informed Consent: I have reviewed the patients History and Physical, chart, labs and discussed the procedure including the risks, benefits and alternatives for the proposed anesthesia with the patient or authorized representative who has indicated his/her understanding and acceptance.     Dental advisory given  Plan Discussed with: CRNA  Anesthesia Plan Comments: (Case cancelled on 2/14 by proceduralist 2/2 inadequate prep )      Anesthesia Quick Evaluation

## 2020-04-30 NOTE — Progress Notes (Signed)
[facility] Progress Note  Patient: Gregory Wiley    ION:[id number]  DOA: 04/28/2020     Date of Service: the patient was seen and examined on 04/30/2020  Brief hospital course: Past medical history of CVA with residual left-sided hemiparesis.  Presented with hematuria and BRBPR.  SP EGD and ureteric stent placement. Currently plan is colonoscopy tomorrow.  Assessment and Plan: 1.  Obstructing left ureteral stone Complicated UTI Appreciate urology consultation. Underwent stent placement on 2/12 on left ureter. Blood and urine cultures are currently pending. Currently on IV Rocephin which we will continue. Follow-up ureteroscopy in the next 2 to 3 weeks. Remove Foley in next 24 hours. Currently signed off.  2.  GI bleed Presents with complaints of melena. Hemoccult positive. Underwent EGD did not show any evidence of significant bleeding. Will be undergoing colonoscopy tomorrow. Clear liquid diet today.  N.p.o. after midnight.  Hold Eliquis.  3.  Acute blood loss anemia Baseline hemoglobin around 14. On presentation hemoglobin 12. Currently hemoglobin 10 secondary to dilution. Monitor H&H and transfuse for hemoglobin less than 8.  4.  History of PE CVA with residual hemiparesis CAD Chronic diastolic CHF Left leg edema Hyperlipidemia On chronic anticoagulation.  Currently on hold. Appreciate GI assistance to ensure that the patient can take his anticoagulation appropriately. Continue iron supplementation. We will continue PPI for now. Continue Crestor. Not on any antiplatelet medication at his baseline. Lower extremity Doppler negative for DVT.  EF 50%.  5.  Chronic hypotension Patient is on midodrine. Etiology of the hypotension is not clear. We will check cosyntropin stimulation test.  6.  Obesity Sleep apnea Patient the patient at high risk of poor outcome. Continue CPAP nightly Body mass index is 34.86 kg/m.   Diet: Clear liquid diet DVT Prophylaxis:    Place TED hose Start: 04/30/20 1034 SCDs Start: 04/29/20 0736    Advance goals of care discussion: DNR  Family Communication: no family was present at bedside, at the time of interview.   Disposition:  Status is: Inpatient  Remains inpatient appropriate because:Ongoing diagnostic testing needed not appropriate for outpatient work up and IV treatments appropriate due to intensity of illness or inability to take PO   Dispo:  Patient From: Home  Planned Disposition: Home with Health Care Svc  Expected discharge date: 05/01/2020  Medically stable for discharge: No          Subjective: No nausea no vomiting but no fever no chills.  No chest pain.  No abdominal pain.  No further bleeding reported by the patient.  Physical Exam:  General: Appear in mild distress, no Rash; Oral Mucosa Clear, moist. no Abnormal Neck Mass Or lumps, Conjunctiva normal  Cardiovascular: S1 and S2 Present, no Murmur, Respiratory: good respiratory effort, Bilateral Air entry present and CTA, no Crackles, no wheezes Abdomen: Bowel Sound present, Soft and no tenderness Extremities: Left leg pedal edema Neurology: alert and oriented to time, place, and person affect appropriate. no new focal deficit, chronic left-sided weakness Gait not checked due to patient safety concerns  Vitals:   04/30/20 0852 04/30/20 0905 04/30/20 0915 04/30/20 1505  BP: (!) 104/40 (!) 110/41 (!) 139/41 (!) 89/50  Pulse: 74 71 72 67  Resp: 19 19 (!) 22 16  Temp: 98.1 F (36.7 C)   98.6 F (37 C)  TempSrc: Oral   Oral  SpO2: 95% 99% 98% 98%  Weight:      Height:        Intake/Output Summary (Last 24  hours) at 04/30/2020 1800 Last data filed at 04/30/2020 0600 Gross per 24 hour  Intake 360 ml  Output 800 ml  Net -440 ml   Filed Weights   04/30/20 0708  Weight: 116.6 kg    Data Reviewed: I have personally reviewed and interpreted daily labs, tele strips, imaging. I reviewed all nursing notes, pharmacy notes,  vitals, pertinent old records I have discussed plan of care as described above with RN and patient/family.  CBC: Recent Labs  Lab 04/28/20 1623 04/29/20 0941  WBC 8.3 7.0  HGB 12.0* 10.0*  HCT 38.3* 32.9*  MCV 95.5 97.1  PLT 232 161   Basic Metabolic Panel: Recent Labs  Lab 04/28/20 1623 04/29/20 0941  NA 141 139  K 3.8 3.5  CL 106 107  CO2 27 24  GLUCOSE 113* 99  BUN 12 13  CREATININE 1.26* 1.26*  CALCIUM 9.5 8.4*    Studies: VAS [place] LOWER EXTREMITY VENOUS (DVT)  Result Date: 04/30/2020  Lower Venous DVT Study Indications: Edema.  Comparison Study: 07/04/15 previous Performing Technologist: Abram Sander RVS  Examination Guidelines: A complete evaluation includes B-mode imaging, spectral Doppler, color Doppler, and power Doppler as needed of all accessible portions of each vessel. Bilateral testing is considered an integral part of a complete examination. Limited examinations for reoccurring indications may be performed as noted. The reflux portion of the exam is performed with the patient in reverse Trendelenburg.  +-----+---------------+---------+-----------+----------+--------------+ RIGHTCompressibilityPhasicitySpontaneityPropertiesThrombus Aging +-----+---------------+---------+-----------+----------+--------------+ CFV  Full           Yes      Yes                                 +-----+---------------+---------+-----------+----------+--------------+   +---------+---------------+---------+-----------+----------+--------------+ LEFT     CompressibilityPhasicitySpontaneityPropertiesThrombus Aging +---------+---------------+---------+-----------+----------+--------------+ CFV      Full           Yes      Yes                                 +---------+---------------+---------+-----------+----------+--------------+ SFJ      Full                                                         +---------+---------------+---------+-----------+----------+--------------+ FV Prox  Full                                                        +---------+---------------+---------+-----------+----------+--------------+ FV Mid   Full                                                        +---------+---------------+---------+-----------+----------+--------------+ FV DistalFull                                                        +---------+---------------+---------+-----------+----------+--------------+  PFV      Full                                                        +---------+---------------+---------+-----------+----------+--------------+ POP      Full           Yes      Yes                                 +---------+---------------+---------+-----------+----------+--------------+ PTV      Full                                                        +---------+---------------+---------+-----------+----------+--------------+ PERO     Full                                                        +---------+---------------+---------+-----------+----------+--------------+     Summary: RIGHT: - No evidence of common femoral vein obstruction.  LEFT: - There is no evidence of deep vein thrombosis in the lower extremity.  - No cystic structure found in the popliteal fossa.  *See table(s) above for measurements and observations.    Preliminary     Scheduled Meds: . Chlorhexidine Gluconate Cloth  6 each Topical Daily  . [START ON 05/01/2020] cosyntropin  0.25 mg Intravenous Once  . midodrine  2.5 mg Oral TID WC  . pantoprazole (PROTONIX) IV  40 mg Intravenous Q12H  . [START ON 05/01/2020] peg 3350 powder  0.5 kit Oral Once  . rosuvastatin  40 mg Oral Daily   Continuous Infusions: . cefTRIAXone (ROCEPHIN)  IV Stopped (04/29/20 0827)  . lactated ringers 75 mL/hr at 04/29/20 2144   PRN Meds: acetaminophen **OR** acetaminophen, promethazine  Time spent: 35  minutes  Author: Berle Mull, MD [facility] 04/30/2020 6:00 PM  To reach On-call, see care teams to locate the attending and reach out via [email or web address]. Between 7PM-7AM, please contact night-coverage If you still have difficulty reaching the attending provider, please page the [facility] (Director on Call) for [facility] on amion for assistance.

## 2020-04-30 NOTE — Telephone Encounter (Addendum)
Agreed.  Thanks.  See inpatient notes.

## 2020-04-30 NOTE — Interval H&P Note (Signed)
History and Physical Interval Note: Patient had a dark stool overnight he thinks, hard for him to tell. Otherwise no other complaints. Hgb has drifted to 10s as of yesterday. Had urologic procedure with stent placement in the ureter yesterday which he tolerated well. Otherwise without complaints. I have discussed EGD and anesthesia with him, risks of each, and he wishes to proceed today for symptoms of melena / anemia. Further recommendations pending the results.   04/30/2020 8:04 AM  Gregory Wiley  has presented today for surgery, with the diagnosis of melena, anemia.  The various methods of treatment have been discussed with the patient and family. After consideration of risks, benefits and other options for treatment, the patient has consented to  Procedure(s): ESOPHAGOGASTRODUODENOSCOPY (EGD) WITH PROPOFOL (N/A) as a surgical intervention.  The patient's history has been reviewed, patient examined, no change in status, stable for surgery.  I have reviewed the patient's chart and labs.  Questions were answered to the patient's satisfaction.     [place]

## 2020-04-30 NOTE — Progress Notes (Signed)
Day of Surgery Subjective: Recovering from upper endoscopy this AM, which was negative.  C/o left flank pain when getting repositioned that is alleviated with tylenol.  Denies nausea/vomiting or catheter discomfort.   Objective: Vital signs in last 24 hours: Temp:  [97 F (36.1 C)-99.2 F (37.3 C)] 98.1 F (36.7 C) (02/13 0852) Pulse Rate:  [63-92] 72 (02/13 0915) Resp:  [13-22] 22 (02/13 0915) BP: (87-139)/(40-71) 139/41 (02/13 0915) SpO2:  [93 %-100 %] 98 % (02/13 0915) Weight:  [116.6 kg] 116.6 kg (02/13 0708)  Intake/Output from previous day: 02/12 0701 - 02/13 0700 In: 1160 [P.O.:360; I.V.:700; IV Piggyback:100] Out: 800 [Urine:800]  Intake/Output this shift: No intake/output data recorded.  Physical Exam:  General: Alert and oriented CV: RRR, palpable distal pulses Lungs: CTAB, equal chest rise Abdomen: Soft, NTND, no rebound or guarding Gu: Catheter draining dark, blood tinged urine Ext: NT, No erythema  Lab Results: Recent Labs    04/28/20 1623 04/29/20 0941  HGB 12.0* 10.0*  HCT 38.3* 32.9*   BMET Recent Labs    04/28/20 1623 04/29/20 0941  NA 141 139  K 3.8 3.5  CL 106 107  CO2 27 24  GLUCOSE 113* 99  BUN 12 13  CREATININE 1.26* 1.26*  CALCIUM 9.5 8.4*     Studies/Results: DG Retrograde Pyelogram  Result Date: 04/30/2020 CLINICAL DATA:  Obstructing 5 mm left ureteral calculus EXAM: RETROGRADE PYELOGRAM CONTRAST:  Please see operative note FLUOROSCOPY TIME:  Fluoroscopy Time:  4 minutes 40 seconds COMPARISON:  04/29/2020 FINDINGS: Spot fluoroscopic imaging during the retrograde pyelogram and stent placement. Initial film demonstrates contrast extravasation from the left UVJ extending into the retroperitoneal space of the left hemipelvis and left lower abdomen. Additional imaging demonstrates catheter access and contrast injection of the collecting system at the level of the left renal pelvis with mild hydronephrosis. Successful placement of a  double-J left ureteral stent with the proximal loop in the renal pelvis and the distal loop in the region of the bladder. IMPRESSION: Peri ureteral contrast extravasation in the left lower abdomen and pelvis related to the intervention. Successful left ureteral stent placement. Electronically Signed   By: Jerilynn Mages.  Shick M.D.   On: 04/30/2020 09:45   CT Renal Stone Study  Result Date: 04/29/2020 CLINICAL DATA:  Hematuria. EXAM: CT ABDOMEN AND PELVIS WITHOUT CONTRAST TECHNIQUE: Multidetector CT imaging of the abdomen and pelvis was performed following the standard protocol without IV contrast. COMPARISON:  Noncontrast CT 12/04/2017 FINDINGS: Lower chest: Coronary artery calcifications. Left basilar atelectasis. There is subpleural fat in the periphery of the left hemithorax. Hepatobiliary: Scattered punctate granuloma. No focal lesion on noncontrast exam. Gallbladder physiologically distended, no calcified stone. No biliary dilatation. Pancreas: Fatty atrophy.  No ductal dilatation or inflammation. Spleen: Calcified granuloma.  Normal in size. Adrenals/Urinary Tract: No adrenal nodule. Obstructing 4 x 5 mm stone in the mid proximal left ureter with mild left hydronephrosis. Additional punctate nonobstructing stone in the lower left kidney. Mild left perinephric edema. Calcification in the lower right kidney is favored to be vascular rather than nonobstructing stone. Slight increased size of a 4 cm cyst in the mid right kidney, previously 3.5 cm. Smaller cyst in the lower right kidney. Urinary bladder is partially distended. Question of focal wall thickening at the left bladder base with punctate calcification, series 3, image 83. Stomach/Bowel: Decompressed stomach. No small bowel obstruction or inflammation. Scattered intraluminal densities throughout the colon suggesting ingested pills. Appendix not confidently visualized, no evidence of appendicitis. Moderate colonic  stool burden. Sigmoid colonic tortuosity. No  colonic inflammation. Vascular/Lymphatic: Aorto bi-iliac atherosclerosis. No aortic aneurysm. No bulky abdominopelvic adenopathy. Reproductive: Enlarged prostate gland spans 5.5 cm transverse. Other: No free air or ascites. Suspect prior umbilical hernia repair. Bilateral fat containing inguinal hernias, left greater than right. Musculoskeletal: Lumbar spondylosis. Surgical hardware in the right proximal femur. Stable peripherally sclerotic lucency about the trans trochanteric screw. No acute osseous abnormalities are seen. IMPRESSION: 1. Obstructing 4 x 5 mm stone in the mid proximal left ureter with mild left hydronephrosis. Additional punctate nonobstructing stone suspected in the lower left kidney. 2. Questionable focal bladder wall thickening with calcification. Recommend direct visualization with cystoscopy to exclude underlying bladder lesion. 3. Enlarged prostate gland. Aortic Atherosclerosis (ICD10-I70.0). Electronically Signed   By: Keith Rake M.D.   On: 04/29/2020 01:25    Assessment/Plan: POD 1 s/p left ureteral stent placement due to an obstructing 4 mm left mid-ureteral stone  -Blood and urine cultures are pending.  Continue Rocephin.  Taylor abx according to sensitivities (will need at least 7-10 days of coverage).  -Ok to remove Foley so long as he remains afebrile for the next 24 hours -Will arrange follow-up ureteroscopy as an outpatient (likely in the next 2-3 weeks) -Call back PRN.    LOS: 1 day   Ellison Hughs, MD [facility] Pager: [phone]  04/30/2020, 1:40 PM

## 2020-04-30 NOTE — Anesthesia Preprocedure Evaluation (Addendum)
Anesthesia Evaluation  Patient identified by MRN, date of birth, ID band Patient awake    Reviewed: Allergy & Precautions, NPO status , Patient's Chart, lab work & pertinent test results  Airway Mallampati: II  TM Distance: >3 FB Neck ROM: Full    Dental  (+) Dental Advisory Given   Pulmonary sleep apnea and Continuous Positive Airway Pressure Ventilation , former smoker,    breath sounds clear to auscultation       Cardiovascular hypertension, Pt. on medications + CAD, + Past MI, + Cardiac Stents, + CABG and +CHF   Rhythm:Regular Rate:Normal  EF 50%.  Grade 1 diastolic dysfunction.  Mild aortic stenosis with mild to moderate aortic insufficiency.  Mild dilation of aortic root and ascending aorta at 42 mm.   Neuro/Psych  Headaches, CVA, Residual Symptoms    GI/Hepatic Neg liver ROS, hiatal hernia, PUD, GERD  ,  Endo/Other  negative endocrine ROS  Renal/GU Renal InsufficiencyRenal disease     Musculoskeletal   Abdominal   Peds  Hematology  (+) anemia ,   Anesthesia Other Findings   Reproductive/Obstetrics                             Anesthesia Physical Anesthesia Plan  ASA: III  Anesthesia Plan: MAC   Post-op Pain Management:    Induction:   PONV Risk Score and Plan: 1 and Propofol infusion and Treatment may vary due to age or medical condition  Airway Management Planned: Natural Airway and Nasal Cannula  Additional Equipment:   Intra-op Plan:   Post-operative Plan:   Informed Consent: I have reviewed the patients History and Physical, chart, labs and discussed the procedure including the risks, benefits and alternatives for the proposed anesthesia with the patient or authorized representative who has indicated his/her understanding and acceptance.   Patient has DNR.  Discussed DNR with patient and Suspend DNR.     Plan Discussed with:   Anesthesia Plan Comments:         Anesthesia Quick Evaluation

## 2020-04-30 NOTE — Progress Notes (Signed)
Lower extremity venous has been completed.   Preliminary results in CV Proc.   Abram Sander 04/30/2020 2:19 PM

## 2020-04-30 NOTE — Progress Notes (Signed)
Pt received back from endo and report given.  SCDs placed and cardiac monitoring connected.  Pt in no pain at present.

## 2020-04-30 NOTE — Op Note (Signed)
[facility] Patient Name: Gregory Wiley Procedure Date : 04/30/2020 MRN: [id number] Attending MD: Carlota Raspberry. Havery Moros , MD Date of Birth: January 20, 1951 CSN: [id number] Age: 70 Admit Type: Inpatient Procedure:                Upper GI endoscopy Indications:              Melena, history of Eliquis use, anemia, history of                            PUD Providers:                Remo Lipps P. Havery Moros, MD, Elspeth Cho Tech.,                            Technician, Particia Nearing, RN Referring MD:              Medicines:                Monitored Anesthesia Care Complications:            No immediate complications. Estimated blood loss:                            Minimal. Estimated Blood Loss:     Estimated blood loss was minimal. Procedure:                Pre-Anesthesia Assessment:                           - Prior to the procedure, a History and Physical                            was performed, and patient medications and                            allergies were reviewed. The patient's tolerance of                            previous anesthesia was also reviewed. The risks                            and benefits of the procedure and the sedation                            options and risks were discussed with the patient.                            All questions were answered, and informed consent                            was obtained. Prior Anticoagulants: The patient has                            taken Eliquis (apixaban), last dose was 2 days  prior to procedure. ASA Grade Assessment: III - A                            patient with severe systemic disease. After                            reviewing the risks and benefits, the patient was                            deemed in satisfactory condition to undergo the                            procedure.                           After obtaining informed consent, the endoscope was                             passed under direct vision. Throughout the                            procedure, the patient's blood pressure, pulse, and                            oxygen saturations were monitored continuously. The                            [id number]-H190 ([id number]) Olympus gastroscope was                            introduced through the mouth, and advanced to the                            second part of duodenum. The upper GI endoscopy was                            accomplished without difficulty. The patient                            tolerated the procedure well. Scope In: Scope Out: Findings:      Esophagogastric landmarks were identified: the Z-line was found at 42       cm, the gastroesophageal junction was found at 42 cm and the upper       extent of the gastric folds was found at 44 cm from the incisors.      A 2 cm hiatal hernia was present.      One benign-appearing, intrinsic mild stenosis was found 42 cm from the       incisors. This stenosis measured less than one cm (in length). No       intervention performed.      The exam of the esophagus was otherwise normal.      Superficial areas erythema / prominent vascular markings (about 5 small       areas) were found in the gastric body. Not overtly consistent with  typical AVMs, I suspect benign prominent superficial erythema / vascular       markings. No stigmata of bleeding or heme in the stomach but fulguration       to ablate the lesion to prevent bleeding by argon plasma was successful       (in case these had been involved in bleeding symptoms in the setting of       anticoagulation).      The exam of the stomach was otherwise normal.      The duodenal bulb and second portion of the duodenum were normal. No       heme noted. Impression:               - Esophagogastric landmarks identified.                           - 2 cm hiatal hernia.                           - Benign-appearing esophageal stenosis.                            - Normal esophagus otherwise                           - Prominent superficial erythema / vascular                            markings in the gastric body, not overtly c/w                            typical AVM. I treated these areas with APC in case                            related to symptoms, but they had no stigmata for                            bleeding noted.                           - Normal stomach otherwise                           - Normal duodenal bulb and second portion of the                            duodenum.                           While gastric findings treated with APC in case                            related to symptoms, no stigmata for bleeding                            noted. I'm not convinced this is the cause of his  symptoms / anemia. BUN normal. Colonoscopy                            recommended to clear lower tract if the patient is                            amenable, given need to resume anticoagulation. Recommendation:           - Return patient to hospital ward for ongoing care.                           - Will discuss colonoscopy with the patient. If                            agreeable, will pursue that tomorrow                           - Clear liquid diet today if patient agreeable to                            colonoscopy tomorrow                           - Continue present medications.                           - Continue to hold Eliquis                           - Trend Hgb, monitor for recurrent bleeding                           - GI service will follow Procedure Code(s):        --- Professional ---                           [phone], Esophagogastroduodenoscopy, flexible,                            transoral; with control of bleeding, any method Diagnosis Code(s):        --- Professional ---                           K44.9, Diaphragmatic hernia without obstruction or                            gangrene                            K22.2, Esophageal obstruction                           K31.89, Other diseases of stomach and duodenum                           K92.1, Melena (includes Hematochezia) CPT copyright 2019 [employer]. All rights reserved. The  codes documented in this report are preliminary and upon coder review may  be revised to meet current compliance requirements. Remo Lipps P. Armbruster, MD 04/30/2020 9:00:07 AM This report has been signed electronically. Number of Addenda: 0

## 2020-04-30 NOTE — Anesthesia Postprocedure Evaluation (Signed)
Anesthesia Post Note  Patient: Gregory Wiley  Procedure(s) Performed: ESOPHAGOGASTRODUODENOSCOPY (EGD) WITH PROPOFOL (N/A ) HOT HEMOSTASIS (ARGON PLASMA COAGULATION/BICAP) (N/A )     Patient location during evaluation: PACU Anesthesia Type: MAC Level of consciousness: awake and alert Pain management: pain level controlled Vital Signs Assessment: post-procedure vital signs reviewed and stable Respiratory status: spontaneous breathing, nonlabored ventilation, respiratory function stable and patient connected to nasal cannula oxygen Cardiovascular status: stable and blood pressure returned to baseline Postop Assessment: no apparent nausea or vomiting Anesthetic complications: no   No complications documented.  Last Vitals:  Vitals:   04/30/20 0905 04/30/20 0915  BP: (!) 110/41 (!) 139/41  Pulse: 71 72  Resp: 19 (!) 22  Temp:    SpO2: 99% 98%    Last Pain:  Vitals:   04/30/20 0915  TempSrc:   PainSc: 0-No pain                 Tiajuana Amass

## 2020-04-30 NOTE — Transfer of Care (Signed)
Immediate Anesthesia Transfer of Care Note  Patient: Gregory Wiley  Procedure(s) Performed: ESOPHAGOGASTRODUODENOSCOPY (EGD) WITH PROPOFOL (N/A ) HOT HEMOSTASIS (ARGON PLASMA COAGULATION/BICAP) (N/A )  Patient Location: PACU  Anesthesia Type:MAC  Level of Consciousness: awake, alert , oriented and patient cooperative  Airway & Oxygen Therapy: Patient Spontanous Breathing  Post-op Assessment: Report given to RN and Post -op Vital signs reviewed and stable  Post vital signs: Reviewed and stable  Last Vitals:  Vitals Value Taken Time  BP 104/40 04/30/20 0852  Temp 36.7 C 04/30/20 0852  Pulse 79 04/30/20 0852  Resp 24 04/30/20 0852  SpO2 96 % 04/30/20 0852  Vitals shown include unvalidated device data.  Last Pain:  Vitals:   04/30/20 0852  TempSrc: Oral  PainSc: 0-No pain      Patients Stated Pain Goal: 0 (49/20/10 0712)  Complications: No complications documented.

## 2020-05-01 DIAGNOSIS — N390 Urinary tract infection, site not specified: Secondary | ICD-10-CM | POA: Diagnosis not present

## 2020-05-01 LAB — CBC
HCT: 30.7 % — ABNORMAL LOW (ref 39.0–52.0)
Hemoglobin: 9.9 g/dL — ABNORMAL LOW (ref 13.0–17.0)
MCH: 30.4 pg (ref 26.0–34.0)
MCHC: 32.2 g/dL (ref 30.0–36.0)
MCV: 94.2 fL (ref 80.0–100.0)
Platelets: 139 10*3/uL — ABNORMAL LOW (ref 150–400)
RBC: 3.26 MIL/uL — ABNORMAL LOW (ref 4.22–5.81)
RDW: 15.6 % — ABNORMAL HIGH (ref 11.5–15.5)
WBC: 4.7 10*3/uL (ref 4.0–10.5)
nRBC: 0 % (ref 0.0–0.2)

## 2020-05-01 LAB — BASIC METABOLIC PANEL
Anion gap: 9 (ref 5–15)
BUN: 11 mg/dL (ref 8–23)
CO2: 24 mmol/L (ref 22–32)
Calcium: 7.7 mg/dL — ABNORMAL LOW (ref 8.9–10.3)
Chloride: 107 mmol/L (ref 98–111)
Creatinine, Ser: 1.05 mg/dL (ref 0.61–1.24)
GFR, Estimated: >60 mL/min (ref >60)
Glucose, Bld: 87 mg/dL (ref 70–99)
Potassium: 3.4 mmol/L — ABNORMAL LOW (ref 3.5–5.1)
Sodium: 140 mmol/L (ref 135–145)

## 2020-05-01 LAB — MAGNESIUM: Magnesium: 1.5 mg/dL — ABNORMAL LOW (ref 1.7–2.4)

## 2020-05-01 MED ORDER — BISACODYL 5 MG PO TBEC
20.0000 mg | DELAYED_RELEASE_TABLET | Freq: Once | ORAL | Status: AC
  Administered 2020-05-01: 20 mg via ORAL
  Filled 2020-05-01: qty 4

## 2020-05-01 MED ORDER — MAGNESIUM SULFATE 2 GM/50ML IV SOLN
2.0000 g | Freq: Once | INTRAVENOUS | Status: AC
  Administered 2020-05-01: 2 g via INTRAVENOUS
  Filled 2020-05-01: qty 50

## 2020-05-01 MED ORDER — POTASSIUM CHLORIDE 10 MEQ/100ML IV SOLN
10.0000 meq | INTRAVENOUS | Status: DC
  Filled 2020-05-01: qty 100

## 2020-05-01 MED ORDER — METOCLOPRAMIDE HCL 5 MG/ML IJ SOLN
10.0000 mg | Freq: Once | INTRAMUSCULAR | Status: AC
  Administered 2020-05-01: 10 mg via INTRAVENOUS
  Filled 2020-05-01: qty 2

## 2020-05-01 MED ORDER — POLYETHYLENE GLYCOL 3350 17 GM/SCOOP PO POWD
1.0000 | Freq: Once | ORAL | Status: AC
  Administered 2020-05-01: 238 g via ORAL
  Filled 2020-05-01: qty 255

## 2020-05-01 MED ORDER — COSYNTROPIN 0.25 MG IJ SOLR
0.2500 mg | Freq: Once | INTRAMUSCULAR | Status: AC
  Administered 2020-05-02: 0.25 mg via INTRAVENOUS
  Filled 2020-05-01: qty 0.25

## 2020-05-01 MED ORDER — METOCLOPRAMIDE HCL 5 MG/ML IJ SOLN: 10.0000 mg | Freq: Once | INTRAMUSCULAR | Status: DC

## 2020-05-01 MED ORDER — PANTOPRAZOLE SODIUM 40 MG PO TBEC
40.0000 mg | DELAYED_RELEASE_TABLET | Freq: Every day | ORAL | Status: DC
  Administered 2020-05-01 – 2020-05-03 (×3): 40 mg via ORAL
  Filled 2020-05-01 (×3): qty 1

## 2020-05-01 MED ORDER — POTASSIUM CHLORIDE CRYS ER 20 MEQ PO TBCR
40.0000 meq | EXTENDED_RELEASE_TABLET | ORAL | Status: AC
Start: 2020-05-01 — End: 2020-05-01
  Administered 2020-05-01 (×2): 40 meq via ORAL
  Filled 2020-05-01 (×2): qty 2

## 2020-05-01 NOTE — Plan of Care (Signed)

## 2020-05-01 NOTE — TOC Initial Note (Signed)
Transition of Care [facility]) - Initial/Assessment Note    Patient Details  Name: Gregory Wiley MRN: [id number] Date of Birth: 08/22/1950  Transition of Care [facility]) CM/SW Contact:    Marilu Favre, RN Phone Number: 05/01/2020, 3:56 PM  Clinical Narrative:                 Spoke to patient and wife [name] at bedside. PAtient from home with wife with [facility].   Confirmed with [place] with [facility] patient is active with HHPT.   Wife would like PT/OT to work with patient here. If he cannot stand and "get around" she will not be able to take him home at discharge. MD entered orders.   He has been to [facility] before for rehab and does not want to return there.   Patient has 3 in1, cane and lift chair at home. He brought a hospital bed off of [facility] 6 years ago but after a year it stopped working.   Will continue to follow for discharge needs.   Expected Discharge Plan: [place] Barriers to Discharge: Continued Medical Work up   Patient Goals and CMS Choice Patient states their goals for this hospitalization and ongoing recovery are:: to return to home CMS Medicare.gov Compare Post Acute Care list provided to:: Patient Choice offered to / list presented to : [facility]  Expected Discharge Plan and Services Expected Discharge Plan: [place]   Discharge Planning Services: CM Consult Post Acute Care Choice: [place] arrangements for the past 2 months: Single Family Home                   DME Agency: NA                  Prior Living Arrangements/Services Living arrangements for the past 2 months: Single Family Home Lives with:: Spouse Patient language and need for interpreter reviewed:: Yes            Current home services: DME,Home PT Criminal Activity/Legal Involvement Pertinent to Current Situation/Hospitalization: No - Comment as needed  Activities of Daily Living      Permission  Sought/Granted   Permission granted to share information with : Yes, Verbal Permission Granted  Share Information with NAME: wife [name]           Emotional Assessment Appearance:: Appears stated age Attitude/Demeanor/Rapport: Engaged Affect (typically observed): Accepting Orientation: : Oriented to Self,Oriented to Place,Oriented to  Time,Oriented to Situation Alcohol / Substance Use: Not Applicable Psych Involvement: No (comment)  Admission diagnosis:  Renal calculus [T26.7] Complicated UTI (urinary tract infection) [N39.0] Gastrointestinal hemorrhage, unspecified gastrointestinal hemorrhage type [K92.2] Hematuria, unspecified type [R31.9] Patient Active Problem List   Diagnosis Date Noted  . GI bleeding 04/29/2020  . Obstruction of left ureteropelvic junction due to stone 04/29/2020  . Hydronephrosis with renal and ureteral calculus obstruction 04/29/2020  . Complicated UTI (urinary tract infection) 04/29/2020  . Hematuria 04/29/2020  . Hemiparesis affecting left side as late effect of cerebrovascular accident (CVA) ([place]) 04/29/2020  . Melena   . Edema 11/22/2019  . Left foot drop 07/14/2019  . GERD (gastroesophageal reflux disease) 06/18/2019  . Tinea cruris 07/20/2017  . Hemorrhoid 07/20/2017  . Irritability 06/08/2017  . Fall at home 05/06/2017  . Research exam 11/28/2016  . Skin mass 11/28/2016  . Lower urinary tract symptoms (LUTS) 09/05/2016  . Hearing loss 09/05/2016  . Excessive daytime sleepiness 04/16/2016  .  Exposure to TB 02/21/2016  . Vitamin D deficiency 02/21/2016  . Anemia 11/10/2015  . Left thigh pain 11/10/2015  . Neck pain 10/12/2015  . Hemiplegia affecting left side in left-dominant patient as late effect of cerebrovascular disease ([place]) 09/03/2015  . Medicare annual wellness visit, subsequent 08/14/2015  . Acute pulmonary embolism ([place]) 07/25/2015  . Chest wall pain 07/15/2015  . History of pulmonary embolism 07/15/2015  . Chronic  anticoagulation 05/22/2015  . Gait disturbance 05/15/2015  . DNR (do not resuscitate) 01/19/2015  . Hypotension 01/14/2015  . Paresthesia   . Depression 12/14/2014  . OSA (obstructive sleep apnea) 11/30/2014  . Chronic combined systolic and diastolic congestive heart failure ([place]) 11/11/2014  . SOB (shortness of breath) 04/17/2014  . Hemiplegia affecting left dominant side ([place]) 02/16/2014  . 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

## 2020-05-01 NOTE — Progress Notes (Addendum)
Pt barely finished the first bottle of moviprep. Pt refusing to take the second dose of moviprep. Educated pt on the importance of the bowel prep. Notified MD.   Pt's stool still is blackish liquidy but with solid particles at this time.

## 2020-05-01 NOTE — Progress Notes (Addendum)
Daily Rounding Note  05/01/2020, 8:23 AM  LOS: 2 days   SUBJECTIVE:   Chief complaint:      As of 430 AM today, stools still black/liquid.  Pt would not start on second portion of moviprep.  Tells me he could not stand the taste.  Denies that there was nausea.  Also, due to his being chronically bedbound he finds having to stool on bed pads distasteful.  Colonoscopy cancelled.  Patient has not seen any red blood.  He is hungry.  OBJECTIVE:         Vital signs in last 24 hours:    Temp:  [97.9 F (36.6 C)-98.6 F (37 C)] 97.9 F (36.6 C) (02/14 0428) Pulse Rate:  [64-74] 64 (02/14 0428) Resp:  [16-22] 17 (02/14 0428) BP: (89-139)/(40-59) 124/59 (02/14 0428) SpO2:  [94 %-99 %] 94 % (02/14 0428) Last BM Date: 04/30/20 Filed Weights   04/30/20 0708  Weight: 116.6 kg   General: Comfortable, alert.  Does not look acutely ill. Heart: RRR Chest: No labored breathing or cough. Abdomen: Soft, nontender, active bowel sounds.  No distention. Extremities: Bilateral lower extremity edema much more pronounced on the left side. Neuro/Psych: Left-sided hemiparesis/weakness.  No involuntary movement.  Alert and oriented x3.  Intake/Output from previous day: 02/13 0701 - 02/14 0700 In: 8101 [P.O.:720; I.V.:750] Out: 1851 [Urine:1850; Stool:1]  Intake/Output this shift: No intake/output data recorded.  Lab Results: Recent Labs    04/28/20 1623 04/29/20 0941 05/01/20 0049  WBC 8.3 7.0 4.7  HGB 12.0* 10.0* 9.9*  HCT 38.3* 32.9* 30.7*  PLT 232 156 139*   BMET Recent Labs    04/28/20 1623 04/29/20 0941 05/01/20 0049  NA 141 139 140  K 3.8 3.5 3.4*  CL 106 107 107  CO2 27 24 24   GLUCOSE 113* 99 87  BUN 12 13 11   CREATININE 1.26* 1.26* 1.05  CALCIUM 9.5 8.4* 7.7*   LFT Recent Labs    04/28/20 1623 04/29/20 0941  PROT 6.9 5.3*  ALBUMIN 2.8* 2.2*  AST 26 28  ALT 13 10  ALKPHOS 64 53  BILITOT 0.8 0.8    PT/INR No results for input(s): LABPROT, INR in the last 72 hours. Hepatitis Panel No results for input(s): HEPBSAG, HCVAB, HEPAIGM, HEPBIGM in the last 72 hours.  Studies/Results: DG Retrograde Pyelogram  Result Date: 04/30/2020 CLINICAL DATA:  Obstructing 5 mm left ureteral calculus EXAM: RETROGRADE PYELOGRAM CONTRAST:  Please see operative note FLUOROSCOPY TIME:  Fluoroscopy Time:  4 minutes 40 seconds COMPARISON:  04/29/2020 FINDINGS: Spot fluoroscopic imaging during the retrograde pyelogram and stent placement. Initial film demonstrates contrast extravasation from the left UVJ extending into the retroperitoneal space of the left hemipelvis and left lower abdomen. Additional imaging demonstrates catheter access and contrast injection of the collecting system at the level of the left renal pelvis with mild hydronephrosis. Successful placement of a double-J left ureteral stent with the proximal loop in the renal pelvis and the distal loop in the region of the bladder. IMPRESSION: Peri ureteral contrast extravasation in the left lower abdomen and pelvis related to the intervention. Successful left ureteral stent placement. Electronically Signed   By: Jerilynn Mages.  Shick M.D.   On: 04/30/2020 09:45   VAS [place] LOWER EXTREMITY VENOUS (DVT)  Result Date: 04/30/2020 Summary: RIGHT: - No evidence of common femoral vein obstruction.  LEFT: - There is no evidence of deep vein thrombosis in the lower extremity.  - No  cystic structure found in the popliteal fossa.    Electronically signed by Harold Barban MD on 04/30/2020 at 7:18:41 PM.    Final     ASSESMENT:   *  GIB w melena/tarry stools. 2/13 EGD: small HH.  Benign esoph stenosis.  Prominent vascular markings in gastric body, not necessarily AVMs and not bleeding.  These were APCd.  No active gastritis (PPI PTA) Rec: colonoscopy.   Adenomatous and HP colon polyps in 2014 Pt did not complete bowel prep.  *   Eliquis.  Hx CVA, PE  *   Blood loss anemia.   Hgb 12 >> 9.9.  No PRBCs to date.  U/A s/o of UTI.  No growth on clx.      *   Thrombocytopenia.  Present in 11/2019 as well.    *   Hematuria.  Obstructing L ureteral stone.   2/13 ureteral stent placement.  On growing Rocephin  *   50% LVEF, grade 1 DD.  Ao valve calcification and mild stenosis.    *   OSA.  On CPAP.     PLAN   *   Colonoscopy rescheduled for tomorrow at 8 AM. Today's bowel prep will be Dulcolax po midday.  Gatorade/MiraLAX/metoclopramide in 2 stages starting at 5 PM with repeat at 11 PM. Clear liquids today, n.p.o. after 5 AM tomorrow morning.  *   Switched to IV Protonix to oral dosing.  *   CBC tomorrow morning.   Azucena Freed  05/01/2020, 8:23 AM Phone [phone]     Attending Physician Note   I have taken an interval history, reviewed the chart and examined the patient. I agree with the Advanced Practitioner's note, impression and recommendations. No recurrent bleeding. Reschedule colonoscopy for tomorrow at 0800 with Miralax / Gatorade prep.   Lucio Edward, MD FACG [phone]

## 2020-05-01 NOTE — Progress Notes (Signed)
[facility] Progress Note  Patient: Gregory Wiley    UXL:[id number]  DOA: 04/28/2020     Date of Service: the patient was seen and examined on 05/01/2020  Brief hospital course: Past medical history of CVA with residual left-sided hemiparesis.  Presented with hematuria and BRBPR.  SP EGD and ureteric stent placement.  Did not finished his prep on 2/13 night. Currently plan is colonoscopy tomorrow.  Assessment and Plan: 1.  Obstructing left ureteral stone Complicated UTI Appreciate urology consultation. Underwent stent placement on 2/12 on left ureter. Blood and urine cultures are currently pending. Currently on IV Rocephin which we will continue. Follow-up ureteroscopy in the next 2 to 3 weeks. Remove Foley  Currently signed off.  2.  GI bleed Presents with complaints of melena. Hemoccult positive. Underwent EGD did not show any evidence of significant bleeding. Will be undergoing colonoscopy tomorrow. Clear liquid diet today.  N.p.o. after midnight.  Hold Eliquis.  3.  Acute blood loss anemia Baseline hemoglobin around 14. On presentation hemoglobin 12. Currently hemoglobin 10 secondary to dilution. Monitor H&H and transfuse for hemoglobin less than 8.  4.  History of PE CVA with residual hemiparesis CAD Chronic diastolic CHF Left leg edema Hyperlipidemia On chronic anticoagulation.  Currently on hold. Appreciate GI assistance to ensure that the patient can take his anticoagulation appropriately. Continue iron supplementation. We will continue PPI for now. Continue Crestor. Not on any antiplatelet medication at his baseline. Lower extremity Doppler negative for DVT.  EF 50%.  5.  Chronic hypotension Patient is on midodrine. Etiology of the hypotension is not clear. We will check cosyntropin stimulation test.  6.  Obesity Sleep apnea Patient the patient at high risk of poor outcome. Continue CPAP nightly Body mass index is 34.86 kg/m.   Diet: Clear  liquid diet DVT Prophylaxis:   Place and maintain sequential compression device Start: 05/01/20 1316 Place TED hose Start: 05/01/20 1316 Place TED hose Start: 04/30/20 1034 SCDs Start: 04/29/20 0736    Advance goals of care discussion: DNR  Family Communication: no family was present at bedside, at the time of interview.   Disposition:  Status is: Inpatient  Remains inpatient appropriate because:Ongoing diagnostic testing needed not appropriate for outpatient work up and IV treatments appropriate due to intensity of illness or inability to take PO   Dispo:  Patient From: Home  Planned Disposition: Home with Health Care Svc  Expected discharge date: 05/03/2020  Medically stable for discharge: No          Subjective: No nausea no vomiting no fever no chills.  Did not like the taste of his bowel prep also did not want to take it after 3 AM in the morning.  Currently agreeable to try again tonight.  Physical Exam:  General: Appear in mild distress, no Rash; Oral Mucosa Clear, moist. no Abnormal Neck Mass Or lumps, Conjunctiva normal  Cardiovascular: S1 and S2 Present, no Murmur, Respiratory: good respiratory effort, Bilateral Air entry present and CTA, no Crackles, no wheezes Abdomen: Bowel Sound present, Soft and no tenderness Extremities: Chronic left leg pedal edema Neurology: alert and oriented to time, place, and person affect appropriate. no new focal deficit chronic left-sided weakness. Gait not checked due to patient safety concerns    Vitals:   05/01/20 0428 05/01/20 1015 05/01/20 1419 05/01/20 1753  BP: (!) 124/59 (!) 111/57 111/65 (!) 102/54  Pulse: 64 69 67 71  Resp: 17     Temp: 97.9 F (36.6 C) 98.7 F (  37.1 C) 97.8 F (36.6 C) 98.6 F (37 C)  TempSrc: Oral Oral Oral Oral  SpO2: 94% 93% 93% 95%  Weight:      Height:        Intake/Output Summary (Last 24 hours) at 05/01/2020 2121 Last data filed at 05/01/2020 1935 Gross per 24 hour  Intake 990 ml   Output 3050 ml  Net -2060 ml   Filed Weights   04/30/20 0708  Weight: 116.6 kg    Data Reviewed: I have personally reviewed and interpreted daily labs, tele strips, imaging. I reviewed all nursing notes, pharmacy notes, vitals, pertinent old records I have discussed plan of care as described above with RN and patient/family.  CBC: Recent Labs  Lab 04/28/20 1623 04/29/20 0941 05/01/20 0049  WBC 8.3 7.0 4.7  HGB 12.0* 10.0* 9.9*  HCT 38.3* 32.9* 30.7*  MCV 95.5 97.1 94.2  PLT 232 156 161*   Basic Metabolic Panel: Recent Labs  Lab 04/28/20 1623 04/29/20 0941 05/01/20 0049  NA 141 139 140  K 3.8 3.5 3.4*  CL 106 107 107  CO2 27 24 24   GLUCOSE 113* 99 87  BUN 12 13 11   CREATININE 1.26* 1.26* 1.05  CALCIUM 9.5 8.4* 7.7*  MG  --   --  1.5*    Studies: No results found.  Scheduled Meds: . Chlorhexidine Gluconate Cloth  6 each Topical Daily  . [START ON 05/02/2020] cosyntropin  0.25 mg Intravenous Once  . metoCLOPramide (REGLAN) injection  10 mg Intravenous Once  . midodrine  2.5 mg Oral TID WC  . pantoprazole  40 mg Oral Q0600  . rosuvastatin  40 mg Oral Daily   Continuous Infusions: . cefTRIAXone (ROCEPHIN)  IV 1 g (05/01/20 1022)  . lactated ringers 75 mL/hr at 05/01/20 0630   PRN Meds: acetaminophen **OR** acetaminophen, promethazine  Time spent: 35 minutes  Author: Berle Mull, MD [facility] 05/01/2020 9:21 PM  To reach On-call, see care teams to locate the attending and reach out via [email or web address]. Between 7PM-7AM, please contact night-coverage If you still have difficulty reaching the attending provider, please page the [facility] (Director on Call) for [facility] on amion for assistance.

## 2020-05-01 NOTE — Progress Notes (Signed)
Pt. Has home cpap and operates himself.

## 2020-05-02 ENCOUNTER — Ambulatory Visit: Payer: Medicare Other | Admitting: Family Medicine

## 2020-05-02 ENCOUNTER — Encounter (HOSPITAL_COMMUNITY)
Admission: EM | Disposition: A | Discharge status: Skilled Nursing Facility | Payer: Self-pay | Source: Home / Self Care | Attending: Internal Medicine

## 2020-05-02 ENCOUNTER — Encounter (HOSPITAL_COMMUNITY): Payer: Self-pay | Admitting: Urology

## 2020-05-02 DIAGNOSIS — K921 Melena: Secondary | ICD-10-CM

## 2020-05-02 DIAGNOSIS — Z7901 Long term (current) use of anticoagulants: Secondary | ICD-10-CM

## 2020-05-02 LAB — CBC
HCT: 32.1 % — ABNORMAL LOW (ref 39.0–52.0)
Hemoglobin: 10 g/dL — ABNORMAL LOW (ref 13.0–17.0)
MCH: 29.2 pg (ref 26.0–34.0)
MCHC: 31.2 g/dL (ref 30.0–36.0)
MCV: 93.9 fL (ref 80.0–100.0)
Platelets: 139 10*3/uL — ABNORMAL LOW (ref 150–400)
RBC: 3.42 MIL/uL — ABNORMAL LOW (ref 4.22–5.81)
RDW: 15.5 % (ref 11.5–15.5)
WBC: 5.1 10*3/uL (ref 4.0–10.5)
nRBC: 0 % (ref 0.0–0.2)

## 2020-05-02 LAB — BASIC METABOLIC PANEL
Anion gap: 8 (ref 5–15)
BUN: 6 mg/dL — ABNORMAL LOW (ref 8–23)
CO2: 23 mmol/L (ref 22–32)
Calcium: 7.9 mg/dL — ABNORMAL LOW (ref 8.9–10.3)
Chloride: 108 mmol/L (ref 98–111)
Creatinine, Ser: 1.04 mg/dL (ref 0.61–1.24)
GFR, Estimated: >60 mL/min (ref >60)
Glucose, Bld: 85 mg/dL (ref 70–99)
Potassium: 3.7 mmol/L (ref 3.5–5.1)
Sodium: 139 mmol/L (ref 135–145)

## 2020-05-02 LAB — ACTH STIMULATION, 3 TIME POINTS
Cortisol, 30 Min: 18.2 ug/dL
Cortisol, 60 Min: 20.4 ug/dL
Cortisol, Base: 11.1 ug/dL

## 2020-05-02 LAB — SARS CORONAVIRUS 2 (TAT 6-24 HRS): SARS Coronavirus 2: POSITIVE — AB

## 2020-05-02 LAB — MAGNESIUM: Magnesium: 1.9 mg/dL (ref 1.7–2.4)

## 2020-05-02 SURGERY — CANCELLED PROCEDURE
Anesthesia: Monitor Anesthesia Care

## 2020-05-02 MED ORDER — COVID-19 MRNA VAC-TRIS(PFIZER) 30 MCG/0.3ML IM SUSP
0.3000 mL | Freq: Once | INTRAMUSCULAR | Status: AC
  Administered 2020-05-02: 0.3 mL via INTRAMUSCULAR
  Filled 2020-05-02 (×2): qty 0.3

## 2020-05-02 MED ORDER — CEPHALEXIN 500 MG PO CAPS
500.0000 mg | ORAL_CAPSULE | Freq: Two times a day (BID) | ORAL | Status: DC
  Administered 2020-05-03 (×2): 500 mg via ORAL
  Filled 2020-05-02 (×2): qty 1

## 2020-05-02 MED ORDER — MIDODRINE HCL 5 MG PO TABS
5.0000 mg | ORAL_TABLET | Freq: Two times a day (BID) | ORAL | Status: DC
  Administered 2020-05-02 – 2020-05-03 (×3): 5 mg via ORAL
  Filled 2020-05-02 (×3): qty 1

## 2020-05-02 MED ORDER — APIXABAN 5 MG PO TABS
5.0000 mg | ORAL_TABLET | Freq: Two times a day (BID) | ORAL | Status: DC
  Administered 2020-05-02 – 2020-05-03 (×4): 5 mg via ORAL
  Filled 2020-05-02 (×4): qty 1

## 2020-05-02 SURGICAL SUPPLY — 21 items

## 2020-05-02 NOTE — Progress Notes (Signed)
[facility] Progress Note  Patient: Gregory Wiley    JSE:[id number]  DOA: 04/28/2020     Date of Service: the patient was seen and examined on 05/02/2020  Brief hospital course: Past medical history of CVA with residual left-sided hemiparesis.  Presented with hematuria and BRBPR.  SP EGD and ureteric stent placement.  Did not finished his prep on 2/13 night. Currently plan is colonoscopy tomorrow.  Assessment and Plan: 1.  Obstructing left ureteral stone Complicated UTI Appreciate urology consultation. Underwent stent placement on 2/12 on left ureter. Blood and urine cultures are currently pending. Currently on IV Rocephin switch to oral Keflex for a total of 5-day treatment course. Follow-up ureteroscopy in the next 2 to 3 weeks. Remove Foley  Currently signed off.  2.  GI bleed Presents with complaints of melena. Hemoccult positive. Underwent EGD did not show any evidence of significant bleeding. Unable to complete prep therefore colonoscopy canceled. We will resume Eliquis and monitor overnight for hemoglobin stability.  3.  Acute blood loss anemia Baseline hemoglobin around 14. On presentation hemoglobin 12. Currently hemoglobin 10 secondary to dilution.  But remained stable. Monitor H&H and transfuse for hemoglobin less than 8.  4.  History of PE CVA with residual hemiparesis CAD Chronic diastolic CHF Left leg edema Hyperlipidemia On chronic anticoagulation.  Appreciate GI assistance to ensure that the patient can take his anticoagulation appropriately. Continue iron supplementation. We will continue PPI for now. Continue Crestor. Not on any antiplatelet medication at his baseline. Lower extremity Doppler negative for DVT.  EF 50%.  5.  Chronic hypotension Patient is on midodrine. Etiology of the hypotension is not clear. Negative cosyntropin stimulation test.  Midodrine dose increased to 5 mg twice daily.  6.  Obesity Sleep apnea Patient the patient at  high risk of poor outcome. Continue CPAP nightly Body mass index is 34.86 kg/m.   Diet: Clear liquid diet DVT Prophylaxis:   Place and maintain sequential compression device Start: 05/01/20 1316 Place TED hose Start: 05/01/20 1316 Place TED hose Start: 04/30/20 1034 SCDs Start: 04/29/20 0736    Advance goals of care discussion: DNR  Family Communication: family was present at bedside, at the time of interview.   Disposition:  Status is: Inpatient  Remains inpatient appropriate because:Ongoing diagnostic testing needed not appropriate for outpatient work up and IV treatments appropriate due to intensity of illness or inability to take PO   Dispo:  Patient From: Home  Planned Disposition: [place]  Expected discharge date: 05/05/2020  Medically stable for discharge: No          Subjective: No nausea no vomiting or no fever no chills.  Unable to complete prep again.  Physical Exam:  General: Appear in mild distress, no Rash; Oral Mucosa Clear, moist. no Abnormal Neck Mass Or lumps, Conjunctiva normal  Cardiovascular: S1 and S2 Present, no Murmur, Respiratory: good respiratory effort, Bilateral Air entry present and CTA, no Crackles, no wheezes Abdomen: Bowel Sound present, Soft and no tenderness Extremities: Left pedal edema Neurology: alert and oriented to time, place, and person affect appropriate. no new focal deficit chronic left-sided weakness Gait not checked due to patient safety concerns  Vitals:   05/01/20 1753 05/01/20 2120 05/02/20 0457 05/02/20 1348  BP: (!) 102/54 (!) 118/57 (!) 109/58 110/66  Pulse: 71 68 62 86  Resp:  17 19 19   Temp: 98.6 F (37 C) 98.6 F (37 C) 97.6 F (36.4 C) 98.3 F (36.8 C)  TempSrc: Oral Oral  Oral Axillary  SpO2: 95% 93% 97% 95%  Weight:      Height:        Intake/Output Summary (Last 24 hours) at 05/02/2020 1914 Last data filed at 05/02/2020 1200 Gross per 24 hour  Intake 2301.66 ml  Output 2100 ml   Net 201.66 ml   Filed Weights   04/30/20 0708  Weight: 116.6 kg    Data Reviewed: I have personally reviewed and interpreted daily labs, tele strips, imaging. I reviewed all nursing notes, pharmacy notes, vitals, pertinent old records I have discussed plan of care as described above with RN and patient/family.  CBC: Recent Labs  Lab 04/28/20 1623 04/29/20 0941 05/01/20 0049 05/02/20 0644  WBC 8.3 7.0 4.7 5.1  HGB 12.0* 10.0* 9.9* 10.0*  HCT 38.3* 32.9* 30.7* 32.1*  MCV 95.5 97.1 94.2 93.9  PLT 232 156 139* 735*   Basic Metabolic Panel: Recent Labs  Lab 04/28/20 1623 04/29/20 0941 05/01/20 0049 05/02/20 0644  NA 141 139 140 139  K 3.8 3.5 3.4* 3.7  CL 106 107 107 108  CO2 27 24 24 23   GLUCOSE 113* 99 87 85  BUN 12 13 11  6*  CREATININE 1.26* 1.26* 1.05 1.04  CALCIUM 9.5 8.4* 7.7* 7.9*  MG  --   --  1.5* 1.9    Studies: No results found.  Scheduled Meds: . apixaban  5 mg Oral BID  . Chlorhexidine Gluconate Cloth  6 each Topical Daily  . COVID-19 mRNA Vac-TriS ([employer])  0.3 mL Intramuscular Once  . metoCLOPramide (REGLAN) injection  10 mg Intravenous Once  . midodrine  5 mg Oral BID WC  . pantoprazole  40 mg Oral Q0600  . rosuvastatin  40 mg Oral Daily   Continuous Infusions:  PRN Meds: acetaminophen **OR** acetaminophen, promethazine  Time spent: 35 minutes  Author: Berle Mull, MD [facility] 05/02/2020 7:14 PM  To reach On-call, see care teams to locate the attending and reach out via [email or web address]. Between 7PM-7AM, please contact night-coverage If you still have difficulty reaching the attending provider, please page the [facility] (Director on Call) for [facility] on amion for assistance.

## 2020-05-02 NOTE — Progress Notes (Signed)
RT placed patient on home CPAP. Patient tolerating well at this time. RT will monitor as needed.

## 2020-05-02 NOTE — Progress Notes (Signed)
2000 Patient miralax and gatorade prep not started at 1700 as ordered.Spoke with pharmacist [name] and miralax sent to floor however unable to obtain gatorade.Per pharmacy need to contact MD to see what else miralax can be mixed in besides water.Patient refusing to drink if only can be mixed in water.Spoke with Dr.Nandigam verbalized can be mixed in any juice.Only cranberry juice found in kitchen.Patient verbalized he wound try to drink prep and complete it by 5 a.m.

## 2020-05-02 NOTE — Progress Notes (Signed)
Patient has only drank two cups of prep encouraging patient to drink more to have prep completed by 5 a.m.Patient verbalized he was doing the best he could to drink it.

## 2020-05-02 NOTE — Evaluation (Signed)
Physical Therapy Evaluation Patient Details Name: Gregory Wiley MRN: [id number] DOB: 1950-12-09 Today's Date: 05/02/2020   History of Present Illness  Gregory Wiley is a 70 y.o. male with a history of CVA with left hemiparesis, PE requiring anticoagulation via Eliquis.  The patient presented to the [facility] emergency department with a 24-hour history of worsening left-sided flank pain along with gross hematuria and black tarry stools.  He had a CT scan on 04/29/2020 that revealed a 4 mm left mid ureteral calculus with mild to moderate left-sided hydronephrosis.  Urinalysisshowed signs of a UTI.  Clinical Impression   Pt admitted with above diagnosis. Comes from home where he lives with is wife in a single level home with ramped entrance; Has been experiencing a slow functional decline over the past 2 years (early 2020 walking household distance with quad cane, using tub transfer bench to get into tub to shower) with recent precipitous decline preceeding this admission (difficulty with reclienr to [facility] transfers); Presents to PT with dense hemiparesis, dec activity tolerance, functional dependencies for mobility and ADLs;  Pt currently with functional limitations due to the deficits listed below (see PT Problem List). Pt will benefit from skilled PT to increase their independence and safety with mobility to allow discharge to the venue listed below.       Follow Up Recommendations SNF;Supervision/Assistance - 24 hour;Other (comment) (Consider assessing pt for depression)    Equipment Recommendations  None recommended by PT (seems quite well equipped; Wife reports their hospital bed in non-functional -- consider replacing)    Recommendations for Other Services OT consult (as ordered)     Precautions / Restrictions Precautions Precautions: Fall Precaution Comments: Dense L hemiparesis      Mobility  Bed Mobility Overal bed mobility: Needs Assistance Bed Mobility: Sit to  Supine;Rolling;Sidelying to Sit Rolling: Min assist (good use of bed rail) Sidelying to sit: Mod assist;Max assist   Sit to supine: +2 for physical assistance;Mod assist   General bed mobility comments: Good use of bedrails to roll; Max assist to clear LEs from EOB; Heavy mod assist to elevate rrunk to sit    Transfers Overall transfer level: Needs assistance Equipment used: 2 person hand held assist Transfers: Sit to/from Stand Sit to Stand: Mod assist;+2 safety/equipment;+2 physical assistance         General transfer comment: Stood from bed with mod assist of 1; Notably good initiation with anterior weight shift, as well as effort to push through LEs; Stood approx 20 seconds  Ambulation/Gait                Stairs            Wheelchair Mobility    Modified Rankin (Stroke Patients Only)       Balance Overall balance assessment: Needs assistance Sitting-balance support: Single extremity supported;Feet supported Sitting balance-Leahy Scale: Fair       Standing balance-Leahy Scale: Poor                               Pertinent Vitals/Pain Pain Assessment: Faces Faces Pain Scale: Hurts a little bit Pain Location: Pain with attempt at donning shoes Pain Descriptors / Indicators: Grimacing Pain Intervention(s): Monitored during session;Repositioned    Home Living Family/patient expects to be discharged to:: Private residence Living Arrangements: Spouse/significant other Available Help at Discharge: Family (Spouse cannot give a lot of physical assist) Type of Home: House Home Access: Ramped entrance  Home Layout: One level Home Equipment: Cane - quad;Bedside commode;Tub bench;Wheelchair - manual (Sleeps in lift chair)      Prior Function Level of Independence: Needs assistance   Gait / Transfers Assistance Needed: as of early 2020 was modified independent with short distance amb with quad cane and transfers; pt and wife report decline  since the onset of Covid to where he needed more and more assist with basic transfers  ADL's / Homemaking Assistance Needed: Needs assist; typically sponge bathes; hasn't gotten inot the shower in quite some time        Hand Dominance        Extremity/Trunk Assessment   Upper Extremity Assessment Upper Extremity Assessment: Defer to OT evaluation    Lower Extremity Assessment Lower Extremity Assessment: Generalized weakness;LLE deficits/detail LLE Deficits / Details: Noting voluntary ankle movement, though not actively getting to neutral; able to initiate hip and knee flexion and extension, though weak and needs assist to lift LLE against gravity    Cervical / Trunk Assessment Cervical / Trunk Assessment: Kyphotic;Other exceptions Cervical / Trunk Exceptions: Stiff upper trunk with forward shoulders posture bilaterally, though especially with L side; Fatigued with incr time sitting EOB and after approx 20 minutes, sinking into upper and lower cervical flexion with decr ability to extend back to upright when cued to do so  Communication      Cognition Arousal/Alertness: Awake/alert Behavior During Therapy: [facility] for tasks assessed/performed;Flat affect (Mood/affect became flatter towards end of session) Overall Cognitive Status: Impaired/Different from baseline Area of Impairment: Safety/judgement;Problem solving                         Safety/Judgement: Decreased awareness of safety;Decreased awareness of deficits   Problem Solving: Decreased initiation;Slow processing;Difficulty sequencing;Requires verbal cues;Requires tactile cues        General Comments General comments (skin integrity, edema, etc.): Reported dizziness with prolonged time sitting EOB; obtained BP which was stable    Exercises     Assessment/Plan    PT Assessment Patient needs continued PT services  PT Problem List Decreased strength;Decreased range of motion;Decreased activity  tolerance;Decreased balance;Decreased mobility;Decreased coordination;Decreased cognition;Decreased knowledge of use of DME;Decreased safety awareness;Decreased knowledge of precautions;Impaired tone       PT Treatment Interventions DME instruction;Gait training;Functional mobility training;Therapeutic activities;Therapeutic exercise;Balance training;Neuromuscular re-education;Cognitive remediation;Patient/family education;Wheelchair mobility training    PT Goals (Current goals can be found in the Care Plan section)  Acute Rehab PT Goals Patient Stated Goal: to eat lunch PT Goal Formulation: With patient/family Time For Goal Achievement: 05/16/20 Potential to Achieve Goals: Fair    Frequency Min 2X/week   Barriers to discharge Decreased caregiver support Pt's wife indicates she is having difficulty giving him the care he needs    Co-evaluation PT/OT/SLP Co-Evaluation/Treatment: Yes Reason for Co-Treatment: Complexity of the patient's impairments (multi-system involvement);Necessary to address cognition/behavior during functional activity;For patient/therapist safety;To address functional/ADL transfers PT goals addressed during session: Mobility/safety with mobility         AM-PAC PT "6 Clicks" Mobility  Outcome Measure Help needed turning from your back to your side while in a flat bed without using bedrails?: A Little Help needed moving from lying on your back to sitting on the side of a flat bed without using bedrails?: A Lot Help needed moving to and from a bed to a chair (including a wheelchair)?: A Lot Help needed standing up from a chair using your arms (e.g., wheelchair or bedside chair)?: A Lot Help  needed to walk in hospital room?: A Lot Help needed climbing 3-5 steps with a railing? : Total 6 Click Score: 12    End of Session Equipment Utilized During Treatment: Gait belt Activity Tolerance: Patient limited by fatigue;Other (comment) (there seems to be something  limiting his participation that is hard to pinpoint -- consider assessment for depression) Patient left: in bed;with call bell/phone within reach;Other (comment) (bed in semi-chair position) Nurse Communication: Mobility status PT Visit Diagnosis: Other abnormalities of gait and mobility (R26.89);Muscle weakness (generalized) (M62.81);Hemiplegia and hemiparesis Hemiplegia - Right/Left: Left Hemiplegia - dominant/non-dominant: Dominant Hemiplegia - caused by: Cerebral infarction    Time: 1212-1249 PT Time Calculation (min) (ACUTE ONLY): 37 min   Charges:   PT Evaluation $PT Eval Moderate Complexity: 1 Mod          Roney [name], PT  Acute Rehabilitation Services Pager [phone] Office [phone]   Colletta [place] 05/02/2020, 2:29 PM

## 2020-05-02 NOTE — Discharge Instructions (Signed)

## 2020-05-02 NOTE — Evaluation (Signed)
Occupational Therapy Evaluation Patient Details Name: Gregory Wiley MRN: [id number] DOB: 06/01/1950 Today's Date: 05/02/2020    History of Present Illness Gregory Wiley is a 70 y.o. male with a history of CVA with left hemiparesis, PE requiring anticoagulation via Eliquis.  The patient presented to the [facility] emergency department with a 24-hour history of worsening left-sided flank pain along with gross hematuria and black tarry stools.  He had a CT scan on 04/29/2020 that revealed a 4 mm left mid ureteral calculus with mild to moderate left-sided hydronephrosis.  Urinalysisshowed signs of a UTI.   Clinical Impression   Pt presents with decline in function and safety with ADLs and ADL mobility with impaired strength, balance, endurance; hx of CVA with L hemiparesis. Pt lives at home with his wife and needs assist at baseline; typically sponge bathes; hasn't gotten into  the shower in quite some time. Wife states that she does most of his ADLs and that he doesn't help very much. Wife states, "he could help and can do some things, he just won't". Pt's wife is very concerned about her ability to physically care for the pt. Pt would benefit from acute OT services to address impairments to maximize level of function and safety    Follow Up Recommendations  SNF;Supervision/Assistance - 24 hour    Equipment Recommendations  None recommended by OT;Other (comment) (pt's wife states that have all DME at home necessary but that hospital bed in not functional)    Recommendations for Other Services       Precautions / Restrictions Precautions Precautions: Fall Precaution Comments: L hemiparesis with impaired tone and AROM in hand and wrist, pt has splint at home but refuses to wear per his wife      Mobility Bed Mobility Overal bed mobility: Needs Assistance Bed Mobility: Sit to Supine;Rolling;Sidelying to Sit Rolling: Min assist Sidelying to sit: Mod assist;Max assist   Sit to supine: +2 for  physical assistance;Mod assist   General bed mobility comments: Good use of bedrails to roll; Max assist to clear LEs from EOB; Heavy mod assist to elevate trunk to sit    Transfers Overall transfer level: Needs assistance Equipment used: 2 person hand held assist Transfers: Sit to/from Stand Sit to Stand: Mod assist;+2 safety/equipment;+2 physical assistance         General transfer comment: Stood from bed with mod assist of 1; Notably good initiation with anterior weight shift, as well as effort to push through LEs; Stood approx 20 seconds    Balance Overall balance assessment: Needs assistance Sitting-balance support: Single extremity supported;Feet supported Sitting balance-Leahy Scale: Fair       Standing balance-Leahy Scale: Poor                             ADL either performed or assessed with clinical judgement   ADL Overall ADL's : Needs assistance/impaired Eating/Feeding: Set up;Sitting   Grooming: Wash/dry hands;Wash/dry face;Min guard   Upper Body Bathing: Maximal assistance   Lower Body Bathing: Total assistance   Upper Body Dressing : Maximal assistance   Lower Body Dressing: Total assistance     Toilet Transfer Details (indicate cue type and reason): pt declined transfers [place] and Hygiene: Total assistance;Bed level         General ADL Comments: Needs assist at baseline; typically sponge bathes; hasn't gotten into  the shower in quite some time. Wife states that she does most  of his ADLs and that he doesn't help very much. Wife states, "he could help and can do some things, he just won't"     Vision Patient Visual Report: No change from baseline       Perception     Praxis      Pertinent Vitals/Pain Pain Assessment: Faces Faces Pain Scale: Hurts a little bit Pain Location: Pain with attempt at donning shoes Pain Descriptors / Indicators: Grimacing Pain Intervention(s): Monitored during  session;Repositioned     Hand Dominance Right   Extremity/Trunk Assessment Upper Extremity Assessment Upper Extremity Assessment: Generalized weakness;LUE deficits/detail LUE Deficits / Details: L hemiparesis with impaired tone and ROM in hand and wrist, pt has soft resting  hand splint at home but refuses to wear per his wife   Lower Extremity Assessment Lower Extremity Assessment: Defer to PT evaluation LLE Deficits / Details: Noting voluntary ankle movement, though not actively getting to neutral; able to initiate hip and knee flexion and extension, though weak and needs assist to lift LLE against gravity   Cervical / Trunk Assessment Cervical / Trunk Assessment: Kyphotic;Other exceptions Cervical / Trunk Exceptions: Stiff upper trunk with forward shoulders posture bilaterally, though especially with L side; Fatigued with incr time sitting EOB and after approx 20 minutes, sinking into upper and lower cervical flexion with decr ability to extend back to upright when cued to do so   Communication Communication Communication: No difficulties   Cognition Arousal/Alertness: Awake/alert Behavior During Therapy: WFL for tasks assessed/performed;Flat affect Overall Cognitive Status: Impaired/Different from baseline Area of Impairment: Safety/judgement;Problem solving                         Safety/Judgement: Decreased awareness of safety;Decreased awareness of deficits   Problem Solving: Decreased initiation;Slow processing;Difficulty sequencing;Requires verbal cues;Requires tactile cues     General Comments  Reported dizziness with prolonged time sitting EOB; obtained BP which was stable    Exercises     Shoulder Instructions      Home Living Family/patient expects to be discharged to:: Private residence Living Arrangements: Spouse/significant other Available Help at Discharge: Family Type of Home: House Home Access: Ramped entrance     Home Layout: One level      Bathroom Shower/Tub: Tub/shower unit         Home Equipment: Cane - quad;Bedside commode;Tub bench;Wheelchair - manual          Prior Functioning/Environment Level of Independence: Needs assistance  Gait / Transfers Assistance Needed: as of early 2020 was modified independent with short distance amb with quad cane and transfers; pt and wife report decline since the onset of Covid to where he needed more and more assist with basic transfers ADL's / Homemaking Assistance Needed: Needs assist; typically sponge bathes; hasn't gotten inot the shower in quite some time. Wife states that she does most of his ADLs and that he doesn't help very much. Wife states, "he could help and can do some things, he just won't"            OT Problem List: Decreased strength;Impaired balance (sitting and/or standing);Impaired tone;Decreased range of motion;Decreased activity tolerance;Decreased coordination;Impaired UE functional use      OT Treatment/Interventions: Self-care/ADL training;Therapeutic exercise;Patient/family education;Neuromuscular education;Balance training;Therapeutic activities;DME and/or AE instruction    OT Goals(Current goals can be found in the care plan section) Acute Rehab OT Goals Patient Stated Goal: "I just want to eat lunch" OT Goal Formulation: With patient/family Time For Goal Achievement: 05/16/20  Potential to Achieve Goals: Good ADL Goals Pt Will Perform Grooming: with supervision;with set-up;sitting Pt Will Perform Upper Body Bathing: with min assist;sitting Pt Will Perform Upper Body Dressing: with min assist;sitting Pt Will Transfer to Toilet: with mod assist;stand pivot transfer;bedside commode Additional ADL Goal #1: Pt will perform self PROM of L UE seated  OT Frequency: Min 2X/week   Barriers to D/C:            Co-evaluation PT/OT/SLP Co-Evaluation/Treatment: Yes Reason for Co-Treatment: Complexity of the patient's impairments (multi-system  involvement);For patient/therapist safety;To address functional/ADL transfers PT goals addressed during session: Mobility/safety with mobility OT goals addressed during session: ADL's and self-care      AM-PAC OT "6 Clicks" Daily Activity     Outcome Measure Help from another person eating meals?: A Little Help from another person taking care of personal grooming?: A Little Help from another person toileting, which includes using toliet, bedpan, or urinal?: Total Help from another person bathing (including washing, rinsing, drying)?: A Lot Help from another person to put on and taking off regular upper body clothing?: A Lot Help from another person to put on and taking off regular lower body clothing?: Total 6 Click Score: 12   End of Session Equipment Utilized During Treatment: Gait belt  Activity Tolerance: Patient limited by fatigue Patient left: in bed;with call bell/phone within reach;with bed alarm set  OT Visit Diagnosis: Unsteadiness on feet (R26.81);Other abnormalities of gait and mobility (R26.89);Muscle weakness (generalized) (M62.81);Hemiplegia and hemiparesis Hemiplegia - Right/Left: Left Hemiplegia - dominant/non-dominant: Dominant Hemiplegia - caused by: Cerebral infarction                Time: 1610-9604 OT Time Calculation (min): 23 min Charges:  OT General Charges $OT Visit: 1 Visit OT Evaluation $OT Eval Moderate Complexity: 1 Mod    Britt Bottom 05/02/2020, 3:09 PM

## 2020-05-02 NOTE — Progress Notes (Signed)
Call placed to Dr.Nandigam awaiting return call.

## 2020-05-02 NOTE — NC FL2 (Signed)
[place] LEVEL OF CARE SCREENING TOOL     IDENTIFICATION  Patient Name: Gregory Wiley Birthdate: 1951/01/06 Sex: male Admission Date (Current Location): 04/28/2020  [facility] and [place] Number:  Herbalist and Address:  [facility] [place], [place] [address], [place], [place] [address]      Provider Number: [id number]  Attending Physician Name and Address:  Lavina Hamman, MD  Relative Name and Phone Number:  Rajah Tagliaferro [phone]    Current Level of Care: Hospital Recommended Level of Care: [place] Prior Approval Number:    Date Approved/Denied:   PASRR Number: [id number]  Discharge Plan: SNF    Current Diagnoses: Patient Active Problem List   Diagnosis Date Noted  . GI bleeding 04/29/2020  . Obstruction of left ureteropelvic junction due to stone 04/29/2020  . Hydronephrosis with renal and ureteral calculus obstruction 04/29/2020  . Complicated UTI (urinary tract infection) 04/29/2020  . Hematuria 04/29/2020  . Hemiparesis affecting left side as late effect of cerebrovascular accident (CVA) ([place]) 04/29/2020  . Melena   . Edema 11/22/2019  . Left foot drop 07/14/2019  . GERD (gastroesophageal reflux disease) 06/18/2019  . Tinea cruris 07/20/2017  . Hemorrhoid 07/20/2017  . Irritability 06/08/2017  . Fall at home 05/06/2017  . Research exam 11/28/2016  . Skin mass 11/28/2016  . Lower urinary tract symptoms (LUTS) 09/05/2016  . Hearing loss 09/05/2016  . Excessive daytime sleepiness 04/16/2016  . Exposure to TB 02/21/2016  . Vitamin D deficiency 02/21/2016  . Anemia 11/10/2015  . Left thigh pain 11/10/2015  . Neck pain 10/12/2015  . Hemiplegia affecting left side in left-dominant patient as late effect of cerebrovascular disease ([place]) 09/03/2015  . Medicare annual wellness visit, subsequent 08/14/2015  . Acute pulmonary embolism ([place]) 07/25/2015  . Chest wall pain 07/15/2015  . History of pulmonary  embolism 07/15/2015  . Chronic anticoagulation 05/22/2015  . Gait disturbance 05/15/2015  . DNR (do not resuscitate) 01/19/2015  . Hypotension 01/14/2015  . Paresthesia   . Depression 12/14/2014  . OSA (obstructive sleep apnea) 11/30/2014  . Chronic combined systolic and diastolic congestive heart failure ([place]) 11/11/2014  . SOB (shortness of breath) 04/17/2014  . Hemiplegia affecting left dominant side ([place]) 02/16/2014  . 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 06/30/2011    Orientation RESPIRATION BLADDER Height & Weight     Self,Time,Situation,Place  Normal Indwelling catheter Weight: 116.6 kg Height:  6' (182.9 cm)  BEHAVIORAL SYMPTOMS/MOOD NEUROLOGICAL BOWEL NUTRITION STATUS      Continent Diet  AMBULATORY STATUS COMMUNICATION OF NEEDS Skin   Extensive Assist Verbally Normal                       Personal Care Assistance Level of Assistance  Bathing,Feeding,Dressing Bathing Assistance: Limited assistance Feeding assistance: Limited assistance Dressing Assistance: Limited assistance     Functional Limitations Info             SPECIAL CARE FACTORS FREQUENCY  PT (By licensed PT),OT (By licensed OT)     PT Frequency: five times a week OT Frequency: five times a week            Contractures Contractures Info: Not present    Additional Factors Info  Code Status,Allergies Code Status Info: DNR Allergies Info: Diphenhydramine, Proscar, Tamsulosin, Adhesvise tape  Current Medications (05/02/2020):  This is the current hospital active medication list Current Facility-Administered Medications  Medication Dose Route Frequency Provider Last Rate Last Admin  . acetaminophen (TYLENOL) tablet 650 mg  650 mg Oral Q6H PRN Ceasar Mons, MD   650 mg at 04/29/20 2205   Or  . acetaminophen (TYLENOL) suppository 650 mg  650 mg Rectal Q6H PRN Ceasar Mons, MD      .  apixaban Arne [place]) tablet 5 mg  5 mg Oral BID Lavina Hamman, MD   5 mg at 05/02/20 1412  . Chlorhexidine Gluconate Cloth 2 % PADS 6 each  6 each Topical Daily Lavina Hamman, MD   6 each at 05/02/20 [phone]  . metoCLOPramide (REGLAN) injection 10 mg  10 mg Intravenous Once Gribbin, Sarah J, PA-C      . midodrine (PROAMATINE) tablet 5 mg  5 mg Oral BID WC Lavina Hamman, MD      . pantoprazole (PROTONIX) EC tablet 40 mg  40 mg Oral Q0600 Vena Rua, PA-C   40 mg at 05/02/20 5009  . promethazine (PHENERGAN) tablet 12.5 mg  12.5 mg Oral Q6H PRN Ceasar Mons, MD      . rosuvastatin (CRESTOR) tablet 40 mg  40 mg Oral Daily Ceasar Mons, MD   40 mg at 05/02/20 3818     Discharge Medications: Please see discharge summary for a list of discharge medications.  Relevant Imaging Results:  Relevant Lab Results:   Additional Information SS [id number], HX CVA residual left sided hemiparesis, 2/12 s/p stent placement left ureter, foley catheter  Esthela Brandner, Edson Snowball, RN

## 2020-05-02 NOTE — Progress Notes (Addendum)
Progress Note   Subjective  Pt only drank one glass of Miralax/Gatorade.    Objective  Vital signs in last 24 hours: Temp:  [97.6 F (36.4 C)-98.7 F (37.1 C)] 97.6 F (36.4 C) (02/15 0457) Pulse Rate:  [62-71] 62 (02/15 0457) Resp:  [17-19] 19 (02/15 0457) BP: (102-118)/(54-65) 109/58 (02/15 0457) SpO2:  [93 %-97 %] 97 % (02/15 0457) Last BM Date: 05/01/20  General: Alert, well-developed, in NAD Heart:  Regular rate and rhythm; no murmurs Chest: Clear to ascultation bilaterally Abdomen:  Soft, nontender and nondistended. Normal bowel sounds, without guarding, and without rebound.   Extremities:  Without edema. Neurologic:  Alert and oriented x4; left sided hemiparesis. Psych:  Alert and cooperative. Normal mood and affect.  Intake/Output from previous day: 02/14 0701 - 02/15 0700 In: 2541.7 [P.O.:840; I.V.:1701.7] Out: 2700 [Urine:2700] Intake/Output this shift: No intake/output data recorded.  Lab Results: Recent Labs    05/01/20 0049 05/02/20 0644  WBC 4.7 5.1  HGB 9.9* 10.0*  HCT 30.7* 32.1*  PLT 139* 139*   BMET Recent Labs    05/01/20 0049 05/02/20 0644  NA 140 139  K 3.4* 3.7  CL 107 108  CO2 24 23  GLUCOSE 87 85  BUN 11 6*  CREATININE 1.05 1.04  CALCIUM 7.7* 7.9*   LFT No results for input(s): PROT, ALBUMIN, AST, ALT, ALKPHOS, BILITOT, BILIDIR, IBILI in the last 72 hours. PT/INR No results for input(s): LABPROT, INR in the last 72 hours. Hepatitis Panel No results for input(s): HEPBSAG, HCVAB, HEPAIGM, HEPBIGM in the last 72 hours.  Studies/Results: VAS [place] LOWER EXTREMITY VENOUS (DVT)  Result Date: 04/30/2020  Lower Venous DVT Study Indications: Edema.  Comparison Study: 07/04/15 previous Performing Technologist: Abram Sander RVS  Examination Guidelines: A complete evaluation includes B-mode imaging, spectral Doppler, color Doppler, and power Doppler as needed of all accessible portions of each vessel. Bilateral testing is considered an  integral part of a complete examination. Limited examinations for reoccurring indications may be performed as noted. The reflux portion of the exam is performed with the patient in reverse Trendelenburg.  +-----+---------------+---------+-----------+----------+--------------+ RIGHTCompressibilityPhasicitySpontaneityPropertiesThrombus Aging +-----+---------------+---------+-----------+----------+--------------+ CFV  Full           Yes      Yes                                 +-----+---------------+---------+-----------+----------+--------------+   +---------+---------------+---------+-----------+----------+--------------+ LEFT     CompressibilityPhasicitySpontaneityPropertiesThrombus Aging +---------+---------------+---------+-----------+----------+--------------+ CFV      Full           Yes      Yes                                 +---------+---------------+---------+-----------+----------+--------------+ SFJ      Full                                                        +---------+---------------+---------+-----------+----------+--------------+ FV Prox  Full                                                        +---------+---------------+---------+-----------+----------+--------------+  FV Mid   Full                                                        +---------+---------------+---------+-----------+----------+--------------+ FV DistalFull                                                        +---------+---------------+---------+-----------+----------+--------------+ PFV      Full                                                        +---------+---------------+---------+-----------+----------+--------------+ POP      Full           Yes      Yes                                 +---------+---------------+---------+-----------+----------+--------------+ PTV      Full                                                         +---------+---------------+---------+-----------+----------+--------------+ PERO     Full                                                        +---------+---------------+---------+-----------+----------+--------------+     Summary: RIGHT: - No evidence of common femoral vein obstruction.  LEFT: - There is no evidence of deep vein thrombosis in the lower extremity.  - No cystic structure found in the popliteal fossa.  *See table(s) above for measurements and observations. Electronically signed by Harold Barban MD on 04/30/2020 at 7:18:41 PM.    Final       Assessment & Recommendations   1. Melena with potential source of bleeding noted on EGD. Two attempts at bowel prep for colonoscopy have not been successful. He is not able to follow through with a bowel prep at this time. Colonoscopy is canceled.  If he has recurrent bleeding we can reassess for colonoscopy. Advance diet. Continue pantoprazole 40 mg (or similar PPI) po qd long term. Avoid ASA/NSAIDs.    2. ABL anemia. Hgb is stable at 10.   3. OK to resume Eliquis as indicated. Defer to primary service.   GI signing off. No plans for outpatient GI follow up at this time. Follow up with PCP post discharge.    LOS: 3 days   Norberto Sorenson T. Fuller Plan MD 05/02/2020, 9:41 AM [phone]

## 2020-05-02 NOTE — Progress Notes (Signed)
Patient did not complete prep.Patient drank total of 2 1/2 cups of prep.Patient last stool remains dark with particles seen.Will notify MD.

## 2020-05-02 NOTE — Telephone Encounter (Signed)
Spoke with wife and advised that Dr. Claiborne Billings recommended to change midodrine to 5 mg BID. Wife report pt is currently admitted. Nurse informed wife that medication change would now be based on current hospital recommendations. Wife verbalized understanding.

## 2020-05-02 NOTE — TOC Progression Note (Addendum)
Transition of Care [facility]) - Progression Note    Patient Details  Name: Gregory Wiley MRN: [id number] Date of Birth: [birth year]  [facility]) CM/SW Contact  Jacalyn Lefevre Edson Snowball, RN Phone Number: 05/02/2020, 3:34 PM  Clinical Narrative:     PT recommendation is for SNF. Patient and [name] [phone] email : [email or web address] both in agreement.   He does NOT want [facility]. His first choice is [facility].   FL2 completed and faxed. NCM called Tracey at [facility] she will review referral.   NCM called [place] to start [place], clinicals faxed to [phone]. REf number [id number]  NCM asked MD for covid test if discharge is tomorrow.    [place] with [facility] returned call. Patient has not had his booster covid vaccine, therefore he would need to be placed in quarantine and she does not have a bed available. NCM explained same to patient at bedside and wife via speaker phone. Patient does want the booster while he is here at the hospital. Secure chatted MD.   Explained to patient and wife , he only has one bed offer right now which is [facility]. [name] is unfamiler with [facility]. She asked about [facility] and [facility] . NCM left messages at [facility] and [facility]. NCM encouraged wife to research [facility] this evening.   NCM will update patient and wife when NCM hears back from [place] and [place].   [place] with [facility] called back she checked with her DON he can go tomorrow and does not need covid booster Expected Discharge Plan: [place] Barriers to Discharge: Continued Medical Work up  Expected Discharge Plan and Services Expected Discharge Plan: [place]   Discharge Planning Services: CM Consult Post Acute Care Choice: [place] arrangements for the past 2 months: Single Family Home                   DME Agency: NA                   Social Determinants of Health (SDOH)  Interventions    Readmission Risk Interventions No flowsheet data found.

## 2020-05-02 NOTE — Progress Notes (Signed)
Dr. Silverio Decamp returned call will notify Dr.Stark.

## 2020-05-03 ENCOUNTER — Encounter (HOSPITAL_COMMUNITY): Payer: Self-pay | Admitting: Gastroenterology

## 2020-05-03 ENCOUNTER — Inpatient Hospital Stay (HOSPITAL_COMMUNITY): Payer: Medicare Other

## 2020-05-03 ENCOUNTER — Ambulatory Visit: Payer: Medicare Other | Admitting: Family Medicine

## 2020-05-03 LAB — CBC
HCT: 30.2 % — ABNORMAL LOW (ref 39.0–52.0)
Hemoglobin: 10.1 g/dL — ABNORMAL LOW (ref 13.0–17.0)
MCH: 31.1 pg (ref 26.0–34.0)
MCHC: 33.4 g/dL (ref 30.0–36.0)
MCV: 92.9 fL (ref 80.0–100.0)
Platelets: 132 10*3/uL — ABNORMAL LOW (ref 150–400)
RBC: 3.25 MIL/uL — ABNORMAL LOW (ref 4.22–5.81)
RDW: 15.3 % (ref 11.5–15.5)
WBC: 5.5 10*3/uL (ref 4.0–10.5)
nRBC: 0 % (ref 0.0–0.2)

## 2020-05-03 LAB — BASIC METABOLIC PANEL
Anion gap: 8 (ref 5–15)
BUN: 7 mg/dL — ABNORMAL LOW (ref 8–23)
CO2: 22 mmol/L (ref 22–32)
Calcium: 7.9 mg/dL — ABNORMAL LOW (ref 8.9–10.3)
Chloride: 109 mmol/L (ref 98–111)
Creatinine, Ser: 1.05 mg/dL (ref 0.61–1.24)
GFR, Estimated: >60 mL/min (ref >60)
Glucose, Bld: 88 mg/dL (ref 70–99)
Potassium: 3.7 mmol/L (ref 3.5–5.1)
Sodium: 139 mmol/L (ref 135–145)

## 2020-05-03 LAB — MAGNESIUM: Magnesium: 1.8 mg/dL (ref 1.7–2.4)

## 2020-05-03 MED ORDER — FERROUS SULFATE 325 (65 FE) MG PO TABS: 325.0000 mg | ORAL_TABLET | Freq: Every day | ORAL | 1 refills | Status: AC

## 2020-05-03 MED ORDER — CEPHALEXIN 500 MG PO CAPS
500.0000 mg | ORAL_CAPSULE | Freq: Two times a day (BID) | ORAL | 0 refills | Status: AC
Start: 2020-05-03 — End: 2020-05-06

## 2020-05-03 MED ORDER — PANTOPRAZOLE SODIUM 40 MG PO TBEC: 40.0000 mg | DELAYED_RELEASE_TABLET | Freq: Every day | ORAL | Status: AC

## 2020-05-03 MED ORDER — ACETAMINOPHEN 325 MG PO TABS
650.0000 mg | ORAL_TABLET | Freq: Four times a day (QID) | ORAL | Status: AC | PRN
Start: 2020-05-03 — End: ?

## 2020-05-03 MED ORDER — GABAPENTIN 300 MG PO CAPS: 300.0000 mg | ORAL_CAPSULE | Freq: Every evening | ORAL | 0 refills | Status: AC | PRN

## 2020-05-03 NOTE — Progress Notes (Signed)
Report given to Melanee Left, RN of [facility] for patient to be discharged to room 805A via [place].  Still awaiting arrival of [facility] transport of which patient is 2nd on the list.  Will update patient accordingly.

## 2020-05-03 NOTE — TOC Progression Note (Addendum)
Transition of Care [facility]) - Progression Note    Patient Details  Name: Gregory Wiley MRN: [id number] Date of Birth: [birth year]  Transition of Care [facility]) CM/SW Contact  Jacalyn Lefevre Edson Snowball, RN Phone Number: 05/03/2020, 10:12 AM  Clinical Narrative:     Patient's covid test is positive. Tracey at [facility] aware. Patient will need 7 days of isolation before he could be transferred.   NCM will call other SNF's to see if they can take covid positive.    Kitty at [facility].  [place] reviewing.Star at [facility] can offer a bed today even with covid positive test   [facility] has no beds.  [facility] left message.  Claiborne Billings at [facility] can accept today. Called [name] and gave bed offer. She would like MD and bedside nurse to call her. Secure chat sent    1230 MD will check chest x ray and oxygen saturations. Possible discharge to SNF today. Called [name] and gave his two bed offers [facility] and [facility] . She will call her daughter to discuss and call NCM back. Patient has bed offers also (medicare.gov list ).   [place] called back and they have chosen [facility]. Confirmed with patient at bedside. Called Star at [facility] and left a message.  Insurance authorization information: Plan authorization number is [id number], Navi authorization number [id number]  Starting 05/03/20 next review date is 05/05/20. Care coordinator [facility] fax number [phone].   Patient has been cleared for discharge. Patient and wife aware. Patient wanted wife to transport him to [facility] . [name] explained to him she cannot transport him due to safety issues. [facility] paperwork done and in chart. Nurse to call report to [facility] at [phone] room 805A .  Discharge summary sent to [facility].    [place] to change SNF to [facility] ref [id number] Expected Discharge Plan: [place] Barriers to Discharge: Continued Medical Work up  Expected  Discharge Plan and Services Expected Discharge Plan: [place]   Discharge Planning Services: CM Consult Post Acute Care Choice: [place] arrangements for the past 2 months: Single Family Home                   DME Agency: NA                   Social Determinants of Health (SDOH) Interventions    Readmission Risk Interventions No flowsheet data found.

## 2020-05-03 NOTE — Discharge Summary (Addendum)
Discharge Summary  Gregory Wiley ZOX:[id number] DOB: 07-27-1950  PCP: Tonia Ghent, MD  Admit date: 04/28/2020 Discharge date: 05/03/2020  Time spent: 84mins, more than 50% time spent on coordination of care.   Recommendations for Outpatient Follow-up:  1. F/u with SNF MD for hospital discharge follow up, repeat cbc/bmp at follow up 2. F/u urology Dr. Gilford Rile in 2 to 3 weeks    Discharge Diagnoses:  Active Hospital Problems   Diagnosis Date Noted  . Complicated UTI (urinary tract infection) 04/29/2020  . GI bleeding 04/29/2020  . Hydronephrosis with renal and ureteral calculus obstruction 04/29/2020  . Hematuria 04/29/2020  . 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 Chronic anticoagulation 05/22/2015  . Chronic combined systolic and diastolic congestive heart failure ([place]) 11/11/2014    Resolved Hospital Problems  No resolved problems to display.    Discharge Condition: stable  Diet recommendation: heart healthy  Filed Weights   04/30/20 0708  Weight: 116.6 kg    History of present illness:  ( per admitting MD Dr Tonie Griffith) Chief Complaint: Blood in urine, dark tarry stool  HPI: Gregory Wiley is a 70 y.o. male with medical history significant for CVA with residual left hemiparesis, peptic ulcer disease, GERD, cardiomyopathy, CAD, hyperlipidemia, history of PE who presents for evaluation of hematuria and dark tarry stools.  Patient is on Eliquis for anticoagulation chronically.  He reports that yesterday afternoon he had very bloody urine with some mild dysuria.  He denies any fever or chills.  He also reports having some lower abdominal cramping and had dark tarry stools yesterday.  He has not had any injury or trauma to his abdomen.  He has had some bruising of his right flank and periumbilical region.  States he has never had kidney stones.  He does not take any medications for symptoms at home.  He does not  have any chest pain, pressure, palpitations, shortness of breath.  He has not had any change in his diet.  He reports that 3 weeks ago he developed a cough and did not feel well for a few days.  He was not tested for Covid but assumed he had it as he was exposed to it from family member.  He quarantined at home and states that symptoms have resolved except for a very mild intermittent dry cough.   ED Course: He is found to have a 5 mm obstructing stone in the mid proximal left ureter causing mild hydronephrosis on CT scan.  He is also found to have hematuria.  He has Hemoccult positive stool on testing.  Hemoglobin is 12.  Hospital Course:  Principal Problem:   Complicated UTI (urinary tract infection) Active Problems:   Chronic combined systolic and diastolic congestive heart failure (HCC)   Chronic anticoagulation   GI bleeding   Hydronephrosis with renal and ureteral calculus obstruction   Hematuria   Hemiparesis affecting left side as late effect of cerebrovascular accident (CVA) ([place])   1.  Obstructing left ureteral stone/Complicated UTI Appreciate urology consultation, Underwent stent placement on 2/12 on left ureter. Urine culture no growth, he received IV Rocephin initially, now on oral Keflex, he is discharged on oral Keflex to finish total of 7 days of treatment per urology recommendation Follow-up ureteroscopy in the next 2 to 3 weeks. Foley removed   2.  GI bleed Presents with complaints of melena. Hemoccult positive. Underwent EGD did not show any evidence of significant bleeding. Unable  to complete prep therefore colonoscopy canceled. Per GI signoff note on February 15, "melena likely potential source of bleeding noted on EGD, recommend long-term PPI, avoid aspirin NSAIDs, no plans for outpatient GI follow-up, follow-up with PCP post discharge" GI okayed to resume Eliquis, HH stable over the last few days, no sign of active bleed  3.  Acute blood loss anemia Baseline  hemoglobin around 14. On presentation hemoglobin 12. Currently hemoglobin 10 secondary to dilution.  But remained stable. Monitor H&H and transfuse for hemoglobin less than 8.  4.  History of PE CVA with residual left hemiparesis, chronic left lower extremity dependent edema CAD/Chronic diastolic CHF Hyperlipidemia On chronic anticoagulation.  Continue iron supplementation. Continue Crestor. Lower extremity Doppler negative for DVT.  EF 50%.  5.  Chronic hypotension, report since after the stroke Patient is on midodrine 2.5mg  TID chronically, can consider uptitrate midodrine dose Etiology of the hypotension is not clear. Negative cosyntropin stimulation test.  Recommend abdominal binder and compression stocking upon standing up  6.  Obesity/Sleep apnea Continue CPAP nightly Body mass index is 34.86 kg/m.   Incidental Covid Initial Covid screening negative, second Covid screening positive He does not have symptom, no hypoxia, chest x-ray no acute findings He finished 2 doses Pfizer Covid vaccine in June 2021, received booster dose on 2/15  Advance goals of care discussion: DNR  Family Communication: Wife over the phone  Procedures:  EGD, Dr. Havery Moros  Ureteral stent placement to left ureter on February 12, Dr. Lovena Neighbours  Consultations:  GI  Urology  Discharge Exam: BP (!) 105/52 (BP Location: Right Arm)   Pulse 67   Temp 98.8 F (37.1 C) (Oral)   Resp 16   Ht 6' (1.829 m)   Wt 116.6 kg   SpO2 96%   BMI 34.86 kg/m   General: NAD Cardiovascular: RRR Respiratory: CTABL Chronic left hemiparesis from prior stroke, chronic left pedal dependent edema  Discharge Instructions You were cared for by a hospitalist during your hospital stay. If you have any questions about your discharge medications or the care you received while you were in the hospital after you are discharged, you can call the unit and asked to speak with the hospitalist on call if the  hospitalist that took care of you is not available. Once you are discharged, your primary care physician will handle any further medical issues. Please note that NO REFILLS for any discharge medications will be authorized once you are discharged, as it is imperative that you return to your primary care physician (or establish a relationship with a primary care physician if you do not have one) for your aftercare needs so that they can reassess your need for medications and monitor your lab values.  Discharge Instructions    Diet - low sodium heart healthy   Complete by: As directed    Increase activity slowly   Complete by: As directed    No wound care   Complete by: As directed      Allergies as of 05/03/2020      Reactions   Diphenhydramine Hcl Anaphylaxis   Adhesive [tape] Other (See Comments)   Tears skin - please use paper tape   Proscar [finasteride] Other (See Comments)   Lightheaded.    Tamsulosin Other (See Comments)   Dizzy, vomiting.       Medication List    STOP taking these medications   doxepin 10 MG capsule Commonly known as: SINEQUAN   HYDROcodone-acetaminophen 5-325 MG tablet Commonly known  as: NORCO/VICODIN   lansoprazole 15 MG capsule Commonly known as: PREVACID   Moringa Oleifera 500 MG Caps   QC LO-DOSE ASPIRIN 81 MG EC tablet Generic drug: aspirin     TAKE these medications   acetaminophen 325 MG tablet Commonly known as: TYLENOL Take 2 tablets (650 mg total) by mouth every 6 (six) hours as needed for mild pain (or Fever >/= 101).   Airborne Assurant 1 tablet by mouth in the morning and at bedtime.   cephALEXin 500 MG capsule Commonly known as: KEFLEX Take 1 capsule (500 mg total) by mouth every 12 (twelve) hours for 3 days.   [employer] Melatonin 10 MG Caps Generic drug: Melatonin TAKE 1 CAPSULE BY MOUTH AT BEDTIME AS NEEDED FOR SLEEP What changed: See the new instructions.   Eliquis 5 MG Tabs tablet Generic drug: apixaban TAKE 1  TABLET BY MOUTH TWICE A DAY What changed: how much to take   ferrous sulfate 325 (65 FE) MG tablet Take 1 tablet (325 mg total) by mouth daily with breakfast. What changed: when to take this   gabapentin 300 MG capsule Commonly known as: NEURONTIN Take 1 capsule (300 mg total) by mouth at bedtime as needed. Take one or two at bedtime What changed:   how much to take  how to take this  when to take this  reasons to take this   Krill Oil 500 MG Caps Take 1 capsule by mouth at bedtime.   midodrine 2.5 MG tablet Commonly known as: PROAMATINE Take 1 tablet (2.5 mg total) by mouth 3 (three) times daily with meals.   nitroGLYCERIN 0.4 MG SL tablet Commonly known as: NITROSTAT Place 1 tablet (0.4 mg total) under the tongue every 5 (five) minutes as needed for chest pain.   NONFORMULARY OR COMPOUNDED ITEM Take 1 tablet by mouth in the morning and at bedtime. Supplement menmoringa oleifera   OVER THE COUNTER MEDICATION Take 1 tablet by mouth in the morning and at bedtime. [place] Cinnamon-  1200 mg   pantoprazole 40 MG tablet Commonly known as: PROTONIX Take 1 tablet (40 mg total) by mouth daily at 6 (six) AM. Start taking on: May 04, 2020   potassium chloride 10 MEQ tablet Commonly known as: Klor-Con M10 TAKE 1 TABLETS BY MOUTH 2 TIMES DAILY. What changed:   how much to take  how to take this  when to take this  additional instructions   rosuvastatin 40 MG tablet Commonly known as: CRESTOR TAKE 1 TABLET BY MOUTH EVERY DAY   sertraline 50 MG tablet Commonly known as: ZOLOFT Take 50 mg by mouth at bedtime.   tiZANidine 4 MG capsule Commonly known as: ZANAFLEX Take 1 capsule (4 mg total) by mouth as needed. What changed:   when to take this  reasons to take this   Vitamin D3 50 MCG (2000 UT) Tabs Take 1 tablet by mouth daily.   zinc gluconate 50 MG tablet Take 50 mg by mouth in the morning and at bedtime.      Allergies  Allergen Reactions  .  Diphenhydramine Hcl Anaphylaxis  . Adhesive [Tape] Other (See Comments)    Tears skin - please use paper tape  . Proscar [Finasteride] Other (See Comments)    Lightheaded.   . Tamsulosin Other (See Comments)    Dizzy, vomiting.     Contact information for follow-up providers    Tonia Ghent, MD Follow up.   Specialty: Family Medicine Contact information: [address]  [place] [address] [phone]        Ceasar Mons, MD Follow up in 2 week(s).   Specialty: Urology Why: ureteroscopy Contact information: [address] [place] [address] [phone]            Contact information for after-discharge care    Destination    [facility] .   Service: Skilled Nursing Contact information: [place] [phone]                   The results of significant diagnostics from this hospitalization (including imaging, microbiology, ancillary and laboratory) are listed below for reference.    Significant Diagnostic Studies: DG Retrograde Pyelogram  Result Date: 04/30/2020 CLINICAL DATA:  Obstructing 5 mm left ureteral calculus EXAM: RETROGRADE PYELOGRAM CONTRAST:  Please see operative note FLUOROSCOPY TIME:  Fluoroscopy Time:  4 minutes 40 seconds COMPARISON:  04/29/2020 FINDINGS: Spot fluoroscopic imaging during the retrograde pyelogram and stent placement. Initial film demonstrates contrast extravasation from the left UVJ extending into the retroperitoneal space of the left hemipelvis and left lower abdomen. Additional imaging demonstrates catheter access and contrast injection of the collecting system at the level of the left renal pelvis with mild hydronephrosis. Successful placement of a double-J left ureteral stent with the proximal loop in the renal pelvis and the distal loop in the region of the bladder. IMPRESSION: Peri ureteral contrast extravasation in the left  lower abdomen and pelvis related to the intervention. Successful left ureteral stent placement. Electronically Signed   By: Jerilynn Mages.  Shick M.D.   On: 04/30/2020 09:45   DG CHEST PORT 1 VIEW  Result Date: 05/03/2020 CLINICAL DATA:  COVID EXAM: PORTABLE CHEST 1 VIEW COMPARISON:  08/07/2017 FINDINGS: Mild cardiomegaly. Prior median sternotomy/CABG. No confluent opacities or effusions. No acute bony abnormality. IMPRESSION: Mild cardiomegaly.  No active disease. Electronically Signed   By: Rolm Baptise M.D.   On: 05/03/2020 13:20   CT Renal Stone Study  Result Date: 04/29/2020 CLINICAL DATA:  Hematuria. EXAM: CT ABDOMEN AND PELVIS WITHOUT CONTRAST TECHNIQUE: Multidetector CT imaging of the abdomen and pelvis was performed following the standard protocol without IV contrast. COMPARISON:  Noncontrast CT 12/04/2017 FINDINGS: Lower chest: Coronary artery calcifications. Left basilar atelectasis. There is subpleural fat in the periphery of the left hemithorax. Hepatobiliary: Scattered punctate granuloma. No focal lesion on noncontrast exam. Gallbladder physiologically distended, no calcified stone. No biliary dilatation. Pancreas: Fatty atrophy.  No ductal dilatation or inflammation. Spleen: Calcified granuloma.  Normal in size. Adrenals/Urinary Tract: No adrenal nodule. Obstructing 4 x 5 mm stone in the mid proximal left ureter with mild left hydronephrosis. Additional punctate nonobstructing stone in the lower left kidney. Mild left perinephric edema. Calcification in the lower right kidney is favored to be vascular rather than nonobstructing stone. Slight increased size of a 4 cm cyst in the mid right kidney, previously 3.5 cm. Smaller cyst in the lower right kidney. Urinary bladder is partially distended. Question of focal wall thickening at the left bladder base with punctate calcification, series 3, image 83. Stomach/Bowel: Decompressed stomach. No small bowel obstruction or inflammation. Scattered intraluminal  densities throughout the colon suggesting ingested pills. Appendix not confidently visualized, no evidence of appendicitis. Moderate colonic stool burden. Sigmoid colonic tortuosity. No colonic inflammation. Vascular/Lymphatic: Aorto bi-iliac atherosclerosis. No aortic aneurysm. No bulky abdominopelvic adenopathy. Reproductive: Enlarged prostate gland spans 5.5 cm transverse. Other: No free air or ascites. Suspect prior umbilical  hernia repair. Bilateral fat containing inguinal hernias, left greater than right. Musculoskeletal: Lumbar spondylosis. Surgical hardware in the right proximal femur. Stable peripherally sclerotic lucency about the trans trochanteric screw. No acute osseous abnormalities are seen. IMPRESSION: 1. Obstructing 4 x 5 mm stone in the mid proximal left ureter with mild left hydronephrosis. Additional punctate nonobstructing stone suspected in the lower left kidney. 2. Questionable focal bladder wall thickening with calcification. Recommend direct visualization with cystoscopy to exclude underlying bladder lesion. 3. Enlarged prostate gland. Aortic Atherosclerosis (ICD10-I70.0). Electronically Signed   By: Keith Rake M.D.   On: 04/29/2020 01:25   VAS [place] LOWER EXTREMITY VENOUS (DVT)  Result Date: 04/30/2020  Lower Venous DVT Study Indications: Edema.  Comparison Study: 07/04/15 previous Performing Technologist: Abram Sander RVS  Examination Guidelines: A complete evaluation includes B-mode imaging, spectral Doppler, color Doppler, and power Doppler as needed of all accessible portions of each vessel. Bilateral testing is considered an integral part of a complete examination. Limited examinations for reoccurring indications may be performed as noted. The reflux portion of the exam is performed with the patient in reverse Trendelenburg.  +-----+---------------+---------+-----------+----------+--------------+ RIGHTCompressibilityPhasicitySpontaneityPropertiesThrombus Aging  +-----+---------------+---------+-----------+----------+--------------+ CFV  Full           Yes      Yes                                 +-----+---------------+---------+-----------+----------+--------------+   +---------+---------------+---------+-----------+----------+--------------+ LEFT     CompressibilityPhasicitySpontaneityPropertiesThrombus Aging +---------+---------------+---------+-----------+----------+--------------+ CFV      Full           Yes      Yes                                 +---------+---------------+---------+-----------+----------+--------------+ SFJ      Full                                                        +---------+---------------+---------+-----------+----------+--------------+ FV Prox  Full                                                        +---------+---------------+---------+-----------+----------+--------------+ FV Mid   Full                                                        +---------+---------------+---------+-----------+----------+--------------+ FV DistalFull                                                        +---------+---------------+---------+-----------+----------+--------------+ PFV      Full                                                        +---------+---------------+---------+-----------+----------+--------------+  POP      Full           Yes      Yes                                 +---------+---------------+---------+-----------+----------+--------------+ PTV      Full                                                        +---------+---------------+---------+-----------+----------+--------------+ PERO     Full                                                        +---------+---------------+---------+-----------+----------+--------------+     Summary: RIGHT: - No evidence of common femoral vein obstruction.  LEFT: - There is no evidence of deep vein thrombosis in the  lower extremity.  - No cystic structure found in the popliteal fossa.  *See table(s) above for measurements and observations. Electronically signed by Harold Barban MD on 04/30/2020 at 7:18:41 PM.    Final     Microbiology: Recent Results (from the past 240 hour(s))  Resp Panel by RT-PCR (Flu A&B, Covid) Nasopharyngeal Swab     Status: None   Collection Time: 04/29/20  4:08 AM   Specimen: Nasopharyngeal Swab; Nasopharyngeal(NP) swabs in vial transport medium  Result Value Ref Range Status   SARS Coronavirus 2 by RT PCR NEGATIVE NEGATIVE Final    Comment: (NOTE) SARS-CoV-2 target nucleic acids are NOT DETECTED.  The SARS-CoV-2 RNA is generally detectable in upper respiratory specimens during the acute phase of infection. The lowest concentration of SARS-CoV-2 viral copies this assay can detect is 138 copies/mL. A negative result does not preclude SARS-Cov-2 infection and should not be used as the sole basis for treatment or other patient management decisions. A negative result may occur with  improper specimen collection/handling, submission of specimen other than nasopharyngeal swab, presence of viral mutation(s) within the areas targeted by this assay, and inadequate number of viral copies(<138 copies/mL). A negative result must be combined with clinical observations, patient history, and epidemiological information. The expected result is Negative.  Fact Sheet for Patients:  EntrepreneurPulse.com.au  Fact Sheet for Healthcare Providers:  IncredibleEmployment.be  This test is no t yet approved or cleared by the [place] FDA and  has been authorized for detection and/or diagnosis of SARS-CoV-2 by FDA under an Emergency Use Authorization (EUA). This EUA will remain  in effect (meaning this test can be used) for the duration of the COVID-19 declaration under Section 564(b)(1) of the Act, 21 U.S.C.section 360bbb-3(b)(1), unless the  authorization is terminated  or revoked sooner.       Influenza A by PCR NEGATIVE NEGATIVE Final   Influenza B by PCR NEGATIVE NEGATIVE Final    Comment: (NOTE) The Xpert Xpress SARS-CoV-2/FLU/RSV plus assay is intended as an aid in the diagnosis of influenza from Nasopharyngeal swab specimens and should not be used as a sole basis for treatment. Nasal washings and aspirates are unacceptable for Xpert Xpress SARS-CoV-2/FLU/RSV testing.  Fact Sheet for Patients: EntrepreneurPulse.com.au  Fact Sheet for Healthcare Providers: IncredibleEmployment.be  This test is not yet approved or cleared by the [place] and has been authorized for detection and/or diagnosis of SARS-CoV-2 by FDA under an Emergency Use Authorization (EUA). This EUA will remain in effect (meaning this test can be used) for the duration of the COVID-19 declaration under Section 564(b)(1) of the Act, 21 U.S.C. section 360bbb-3(b)(1), unless the authorization is terminated or revoked.  Performed at [facility], [place] [address] [place], [place] [address]   Surgical pcr screen     Status: None   Collection Time: 04/29/20  2:20 PM   Specimen: Nasal Mucosa; Nasal Swab  Result Value Ref Range Status   MRSA, PCR NEGATIVE NEGATIVE Final   Staphylococcus aureus NEGATIVE NEGATIVE Final    Comment: (NOTE) The Xpert SA Assay (FDA approved for NASAL specimens in patients 36 years of age and older), is one component of a comprehensive surveillance program. It is not intended to diagnose infection nor to guide or monitor treatment. Performed at [facility], [place] [address], [place], [place] [address]   Urine Culture     Status: None   Collection Time: 04/29/20  4:06 PM   Specimen: Urine, Cystoscope  Result Value Ref Range Status   Specimen Description URINE, RANDOM CYSTOCOPY  Final   Special Requests SPEC A  Final   Culture   Final    NO GROWTH Performed at  [facility], [place] [address] [place], [place] [address]    Report Status 04/30/2020 FINAL  Final  SARS CORONAVIRUS 2 (TAT 6-24 HRS) Nasopharyngeal Nasopharyngeal Swab     Status: Abnormal   Collection Time: 05/02/20  3:34 PM   Specimen: Nasopharyngeal Swab  Result Value Ref Range Status   SARS Coronavirus 2 POSITIVE (A) NEGATIVE Final    Comment: (NOTE) SARS-CoV-2 target nucleic acids are DETECTED.  The SARS-CoV-2 RNA is generally detectable in upper and lower respiratory specimens during the acute phase of infection. Positive results are indicative of the presence of SARS-CoV-2 RNA. Clinical correlation with patient history and other diagnostic information is  necessary to determine patient infection status. Positive results do not rule out bacterial infection or co-infection with other viruses.  The expected result is Negative.  Fact Sheet for Patients: SugarRoll.be  Fact Sheet for Healthcare Providers: https://www.woods-mathews.com/  This test is not yet approved or cleared by the [place] FDA and  has been authorized for detection and/or diagnosis of SARS-CoV-2 by FDA under an Emergency Use Authorization (EUA). This EUA will remain  in effect (meaning this test can be used) for the duration of the COVID-19 declaration under Section 564(b)(1) of the Act, 21 U. S.C. section 360bbb-3(b)(1), unless the authorization is terminated or revoked sooner.   Performed at [facility], [place] [address], [place], [place] [address]      Labs: Basic Metabolic Panel: Recent Labs  Lab 04/28/20 1623 04/29/20 0941 05/01/20 0049 05/02/20 0644 05/03/20 0332  NA 141 139 140 139 139  K 3.8 3.5 3.4* 3.7 3.7  CL 106 107 107 108 109  CO2 27 24 24 23 22   GLUCOSE 113* 99 87 85 88  BUN 12 13 11  6* 7*  CREATININE 1.26* 1.26* 1.05 1.04 1.05  CALCIUM 9.5 8.4* 7.7* 7.9* 7.9*  MG  --   --  1.5* 1.9 1.8   Liver Function Tests: Recent  Labs  Lab 04/28/20 1623 04/29/20 0941  AST 26 28  ALT 13 10  ALKPHOS 64 53  BILITOT 0.8 0.8  PROT 6.9 5.3*  ALBUMIN 2.8* 2.2*   No results for input(s): LIPASE, AMYLASE in the last 168 hours. No results for input(s): AMMONIA in the last 168 hours. CBC: Recent Labs  Lab 04/28/20 1623 04/29/20 0941 05/01/20 0049 05/02/20 0644 05/03/20 0332  WBC 8.3 7.0 4.7 5.1 5.5  HGB 12.0* 10.0* 9.9* 10.0* 10.1*  HCT 38.3* 32.9* 30.7* 32.1* 30.2*  MCV 95.5 97.1 94.2 93.9 92.9  PLT 232 156 139* 139* 132*   Cardiac Enzymes: Recent Labs  Lab 04/29/20 0138  CKTOTAL 86   BNP: BNP (last 3 results) No results for input(s): BNP in the last 8760 hours.  ProBNP (last 3 results) No results for input(s): PROBNP in the last 8760 hours.  CBG: No results for input(s): GLUCAP in the last 168 hours.     Signed:  Florencia Reasons MD, PhD, FACP  [facility] 05/03/2020, 2:16 PM

## 2020-05-03 NOTE — Progress Notes (Signed)
2343- Seen that test result for covid came back positive. Placed pt on airborne/contact precautions. Text message sent to E. Stark Klein, NP about the said result.

## 2020-05-03 NOTE — Progress Notes (Signed)
Melanee Left, RN of [facility] (receiving facility) was made aware that patient had just left [facility] via [facility].  Patient alert, and stable.  Discharged packet given to patient/[facility] transporter.

## 2020-05-05 ENCOUNTER — Telehealth: Payer: Self-pay | Admitting: Cardiovascular Disease

## 2020-05-05 NOTE — Telephone Encounter (Signed)
   [facility] Pre-operative Risk Assessment    HEARTCARE STAFF: - Please ensure there is not already an duplicate clearance open for this procedure. - Under Visit Info/Reason for Call, type in Other and utilize the format Clearance MM/DD/YY or Clearance TBD. Do not use dashes or single digits. - If request is for dental extraction, please clarify the # of teeth to be extracted.  Request for surgical clearance:  1. What type of surgery is being performed? Cystoscopy with stent exchange ureteroscopy lithotripsy    2. When is this surgery scheduled? TBD   3. What type of clearance is required (medical clearance vs. Pharmacy clearance to hold med vs. Both)? Both   4. Are there any medications that need to be held prior to surgery and how long?  Apixiban 3 days prior   5. Practice name and name of physician performing surgery? [facility], Dr. Harrell Gave Lovena Neighbours   6. What is the office phone number? [phone]   7.   What is the office fax number? [phone]  8.   Anesthesia type (None, local, MAC, general) ? General    Trilby Drummer 05/05/2020, 2:30 PM  _________________________________________________________________   (provider comments below)

## 2020-05-05 NOTE — Telephone Encounter (Signed)
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 Currently on Eliquis for anticoagulation.  Procedure: Cystoscopy with stent exchange ureteroscopy lithotripsy    Date of procedure: TBD  CrCl 61mL/min using adjusted body weight Platelet count 132K  Request is to hold Eliquis for 3 days prior to procedure, however would prefer to minimize time off of anticoagulation as much as possible. Pt is at elevated risk off of anticoagulation given history of recurrent PE and DVT. Also has history of GI bleed 1 week ago so Lovenox may not be the safest either. Will route to MD for input.

## 2020-05-10 NOTE — Telephone Encounter (Signed)
Dr Claiborne Billings please review pharmacy note for clearance and respond to [facility] PRE OP  Chalmers Iddings PA-C 05/10/2020 8:03 AM

## 2020-05-11 ENCOUNTER — Telehealth: Payer: Self-pay

## 2020-05-11 NOTE — Telephone Encounter (Signed)
Gregory Wiley with [facility] specialist in [place] left v/m wanting status of faxed surgical clearance request on 05/05/20. Pt has kidney stone and Gregory Wiley request cb ASAP. Sending note to Dr Damita Dunnings and Janett Billow CMA.

## 2020-05-12 NOTE — Telephone Encounter (Signed)
Please fax this phone note with the other paperwork to urology.  I saw this message late in the afternoon on 05/11/2020.  I needed time to review the case.  I was not in clinic earlier this week and the fax was not available for review prior to my absence from clinic.  I do not see how the patient can avoid the procedure, so he needs to proceed.  It makes sense to hold Eliquis 3 days prior to procedure with restart date to be advised by urology, depending on the findings.  I thank all involved.

## 2020-05-12 NOTE — Telephone Encounter (Signed)
Faxed Form and TE note to [facility]; received fax confirmation that was went through.

## 2020-05-15 NOTE — Telephone Encounter (Signed)
Left message for pt to call back. Richardson Dopp, PA-C    05/15/2020 8:59 AM

## 2020-05-15 NOTE — Telephone Encounter (Signed)
Reviewed case with Dr. Claiborne Billings.  Okay to hold Eliquis x 3 days, restart as soon as safe.

## 2020-05-16 ENCOUNTER — Non-Acute Institutional Stay: Payer: Self-pay | Admitting: Student

## 2020-05-16 ENCOUNTER — Other Ambulatory Visit: Payer: Self-pay

## 2020-05-16 DIAGNOSIS — Z515 Encounter for palliative care: Secondary | ICD-10-CM

## 2020-05-16 NOTE — Progress Notes (Signed)
[facility] Consult Note Telephone: [phone]  Fax: [phone]  PATIENT NAME: Gregory Wiley [address] [name] [address] [phone] (home)  DOB: 1950/07/22 MRN: [id number]  PRIMARY CARE PROVIDER:    Tonia Ghent, MD,  [place] [address] [phone]  REFERRING PROVIDER:   Tonia Ghent, MD [place],  [place] [address] [phone]  RESPONSIBLE PARTY:   Extended Emergency Contact Information Primary Emergency Contact: Wiley,Gregory Address: [place], [place] [address] [place] Phone: [phone] Mobile Phone: [phone] Relation: Spouse Secondary Emergency Contact: Wiley,Gregory Address: [place]          [place] Phone: [phone] Mobile Phone: [phone] Relation: Daughter  I met face to face with patient in facility.   ASSESSMENT AND RECOMMENDATIONS:   Advance Care Planning: Visit at the request of Dr. Keenan Bachelor for palliative consult. Visit consisted of building trust and discussions on Palliative care medicine as specialized medical care for people living with serious illness, aimed at facilitating improved quality of life through symptoms relief, assisting with advance care planning and establishing goals of care. Education provided on Palliative Medicine vs. Hospice services. Palliative care will continue to provide support to patient, family and the medical team.  Message left for patient's wife to discuss today's visit and goals of care; awaiting return call.   Goal of care: To return home.   Directives: MOST form reviewed; no changes.   Symptom Management:   Hematuria-patient with hematuria, obstructing left ureteral stone s/p stent placement. U/A and CBC, BMP collected per staff; pending results. Follow up with Urology as scheduled.  Hemiparesis affecting  left side as late effect CVA-patient with left sided hemiparesis. Currently receiving therapy. Recommend continuing PT/OT as directed. Staff to assist with adl's.   Light headedness/ hypotension-patient with hx of hypotension since cerebral infarction. Recommend continue midodrine 2.64m TID, changing positions slowly. Routine vital sign checks per facility.   Insomnia-patient reports not sleeping well for several nights; he was moved to a private room. Recommend melatonin 182mQHS prn.   Follow up Palliative Care Visit: Palliative care will continue to follow for complex decision making and symptom management. Return in 4 weeks or prn.  Family /Caregiver/Community Supports: Palliative Medicine will continue to provide support.   I spent 35 minutes providing this consultation, time includes time spent with patient/family, chart review, provider coordination, and documentation. More than 50% of the time in this consultation was spent counseling and coordinating communication.   CHIEF COMPLAINT: Palliative Medicine initial consult visit.   History obtained from review of EMR, discussion with primary team. Records reviewed and summarized below.  HISTORY OF PRESENT ILLNESS:  Gregory SOUTHWELLs a [age].o. 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 Palliative Care was asked to follow this patient by consultation request of Dr. BlKeenan Bacheloro help address advance care planning and goals of care. This is an initial visit.  Patient denies pain. He does endorse hematuria; u/a and lab work collected and pending results. He denies any blood in stool. Patient endorses feeling light headed when he sits up; receives midodrine TID. He states he has  not  slept well in past 3 nights due to his room mate and has not been wearing his CPAP as directed the past several nights. Patient states his wife is taking him home on Friday.   CODE STATUS: DNR  PPS: 40%  HOSPICE ELIGIBILITY/DIAGNOSIS: TBD  ROS   General: NAD EYES: denies vision changes ENMT: denies dysphagia Cardiovascular: denies chest pain Pulmonary: denies cough, denies SOB Abdomen: endorses fair appetite, denies constipation GU: hematuria reported MSK: left sided weakness, no falls reported Skin: denies rashes or wounds Neurological: endorses weakness, reports insomnia Psych: depressed mood Heme/lymph/immuno: denies bruises, abnormal bleeding   Physical Exam: Pulse 80, resp 16, blood pressure 112/ 60, sats 97% on room air Constitutional: NAD General: well nourished EYES: anicteric sclera, lids intact, no discharge  ENMT: intact hearing,oral mucous membranes moist CV: RRR, LE edema Pulmonary: LCTA, no increased work of breathing, no cough Abdomen: bowel sounds normoactive x 4 GU: deferred MSK: left sided hemiplegia Skin: warm and dry, no rashes or wounds on visible skin Neuro: Generalized weakness Psych: flat affect Hem/lymph/immuno: no widespread bruising   PAST MEDICAL HISTORY:  Past Medical History:  Diagnosis Date  . Adrenal insufficiency ([place])   . Anxiety   . Basal cell carcinoma of skin of left ankle   . Bleeding stomach ulcer 2015  . Coronary artery disease    stent, then CABG 07/20/10  . Daily headache    "for the past month" (11/11/2014)  . Depression   . Diverticulosis   . GERD (gastroesophageal reflux disease)   . Gout   . H/O cardiomyopathy    ischemic, now last echo 07/30/11, EF 38%W  . History of hiatal hernia   . Hyperlipidemia   . Hypertension   . Internal hemorrhoids   . Left hemiplegia ([place])   . NSTEMI (non-ST elevated myocardial infarction) ([place])    NSTEMI- last cath 06/2011-Stent to LCX-DES, last nuc 07/30/11 low risk  . Obesity (BMI  30-39.9)   . OSA on CPAP    since July 2013- uses CPAP sometimes , states he has lost 147 lbs. since stroke & doesn't use the CPAP as much as he use to.   . Plantar fasciitis of left foot   . Pneumonia    hosp. 12/2014  . Pulmonary embolism ([place]) 03/2014  . Stroke ([place]) 01/2014   "no control of LUE, can slightly LLE; memory problems since" (11/11/2014)  . Tubular adenoma of colon     SOCIAL HX:  Social History   Tobacco Use  . Smoking status: Former Smoker    Packs/day: 2.00    Years: 4.00    Pack years: 8.00    Types: Cigarettes    Quit date: 01/30/1969    Years since quitting: 51.3  . Smokeless tobacco: Never Used  . Tobacco comment: "quit smoking cigarettes  in the 1970's"  Substance Use Topics  . Alcohol use: No    Alcohol/week: 0.0 standard drinks   FAMILY HX:  Family History  Problem Relation Age of Onset  . Diabetes type II Mother   . CAD Father 42       CABG, PCI, died at 39  . Heart attack Father 64  . Hypertension Brother   . Diabetes Brother   . Hyperlipidemia Brother   . Brain cancer Maternal Grandmother   . COPD Paternal Grandfather   . Coronary artery disease Other   . Other Other        denies family history of DVT/PE  . Colon  cancer Neg Hx   . Stroke Neg Hx   . Prostate cancer Neg Hx     ALLERGIES:  Allergies  Allergen Reactions  . Diphenhydramine Hcl Anaphylaxis  . Adhesive [Tape] Other (See Comments)    Tears skin - please use paper tape  . Proscar [Finasteride] Other (See Comments)    Lightheaded.   . Tamsulosin Other (See Comments)    Dizzy, vomiting.      PERTINENT MEDICATIONS:  Outpatient Encounter Medications as of 05/16/2020  Medication Sig  . acetaminophen (TYLENOL) 325 MG tablet Take 2 tablets (650 mg total) by mouth every 6 (six) hours as needed for mild pain (or Fever >/= 101).  . Cholecalciferol (VITAMIN D3) 50 MCG (2000 UT) TABS Take 1 tablet by mouth daily.  . CVS MELATONIN 10 MG CAPS TAKE 1 CAPSULE BY MOUTH AT BEDTIME AS  NEEDED FOR SLEEP (Patient taking differently: Take 10 mg by mouth at bedtime.)  . ELIQUIS 5 MG TABS tablet TAKE 1 TABLET BY MOUTH TWICE A DAY (Patient taking differently: Take 5 mg by mouth 2 (two) times daily.)  . ferrous sulfate 325 (65 FE) MG tablet Take 1 tablet (325 mg total) by mouth daily with breakfast.  . gabapentin (NEURONTIN) 300 MG capsule Take 1 capsule (300 mg total) by mouth at bedtime as needed. Take one or two at bedtime  . Krill Oil 500 MG CAPS Take 1 capsule by mouth at bedtime.  . midodrine (PROAMATINE) 2.5 MG tablet Take 1 tablet (2.5 mg total) by mouth 3 (three) times daily with meals.  . Misc Natural Products (AIRBORNE ELDERBERRY) CHEW Chew 1 tablet by mouth in the morning and at bedtime.  . nitroGLYCERIN (NITROSTAT) 0.4 MG SL tablet Place 1 tablet (0.4 mg total) under the tongue every 5 (five) minutes as needed for chest pain.  . NONFORMULARY OR COMPOUNDED ITEM Take 1 tablet by mouth in the morning and at bedtime. Supplement menmoringa oleifera  . OVER THE COUNTER MEDICATION Take 1 tablet by mouth in the morning and at bedtime. Ceylon Cinnamon-  1200 mg  . pantoprazole (PROTONIX) 40 MG tablet Take 1 tablet (40 mg total) by mouth daily at 6 (six) AM.  . potassium chloride (KLOR-CON M10) 10 MEQ tablet TAKE 1 TABLETS BY MOUTH 2 TIMES DAILY. (Patient taking differently: Take 10 mEq by mouth 2 (two) times daily.)  . rosuvastatin (CRESTOR) 40 MG tablet TAKE 1 TABLET BY MOUTH EVERY DAY (Patient taking differently: Take 40 mg by mouth daily.)  . sertraline (ZOLOFT) 50 MG tablet Take 50 mg by mouth at bedtime.  Marland Kitchen tiZANidine (ZANAFLEX) 4 MG capsule Take 1 capsule (4 mg total) by mouth as needed. (Patient taking differently: Take 4 mg by mouth daily as needed for muscle spasms.)  . zinc gluconate 50 MG tablet Take 50 mg by mouth in the morning and at bedtime.  . [DISCONTINUED] potassium chloride (K-DUR) 10 MEQ tablet TAKE 1 TABLETS BY MOUTH 2 TIMES DAILY.   No facility-administered  encounter medications on file as of 05/16/2020.     Thank you for the opportunity to participate in the care of Mr. Puglia. The palliative care team will continue to follow. Please call our office at [phone] if we can be of additional assistance.  Ezekiel Slocumb, NP , AGPCNP-C

## 2020-05-17 ENCOUNTER — Telehealth: Payer: Self-pay | Admitting: Student

## 2020-05-17 NOTE — Telephone Encounter (Signed)
Palliative NP received return call from patient's [name]. Discussed Palliative Medicine visit yesterday. NP explained differences between Palliative Medicine and Hospice. Patient is discharging home on Friday. Wife would like for Palliative Medicine to continue to follow patient at home.

## 2020-05-19 NOTE — Telephone Encounter (Signed)
   Primary Cardiologist: Shelva Majestic, MD  Chart reviewed as part of pre-operative protocol coverage. Given past medical history and time since last visit, based on ACC/AHA guidelines, [place] would be at acceptable risk for the planned procedure without further cardiovascular testing.   Per review by primary cardiologist and Dr. Claiborne Billings, okay to hold Eliquis 3 days prior to planned procedure.   The patient was advised that if he develops new symptoms prior to surgery to contact our office to arrange for a follow-up visit, and he verbalized understanding.  I will route this recommendation to the requesting party via Epic fax function and remove from pre-op pool.  Please call with questions.  Loel Dubonnet, NP 05/19/2020, 10:23 AM

## 2020-05-22 ENCOUNTER — Telehealth: Payer: Self-pay | Admitting: *Deleted

## 2020-05-22 NOTE — Telephone Encounter (Signed)
Reviewed chart. Reasonable for palliative care to establish with patient. [place] for this.

## 2020-05-22 NOTE — Telephone Encounter (Signed)
Dr. Darnell Level,   Please see the March 7th note which is connected to this old my chart message string.  Let me know if you need anything further.

## 2020-05-22 NOTE — Telephone Encounter (Signed)
Looks like midodrine is being managed by cardiology. Will forward to cardiology RN to address. Thank you.

## 2020-05-22 NOTE — Telephone Encounter (Signed)
Erline Levine with [facility] left a voicemail stating that patient is being discharged from a facility back to home. Erline Levine stated that the patient is requesting palliative care. Erline Levine is calling to make sure that Dr. Damita Dunnings is okay with this.

## 2020-05-23 ENCOUNTER — Telehealth: Payer: Self-pay | Admitting: Student

## 2020-05-23 MED ORDER — MIDODRINE HCL 5 MG PO TABS: 5.0000 mg | ORAL_TABLET | Freq: Two times a day (BID) | ORAL | 1 refills | Status: AC

## 2020-05-23 NOTE — Telephone Encounter (Signed)
Ok to increase to 5 mg bid.  Please be sure to let them know that the second dose can be at noon or with dinner, but no later than 5-6 pm.  Most patients do better with taking it at breakfast and lunch if only needing it twice daily

## 2020-05-23 NOTE — Addendum Note (Signed)
Addended by: Fidel Levy on: 05/23/2020 08:37 AM   Modules accepted: Orders

## 2020-05-23 NOTE — Telephone Encounter (Signed)
Palliative NP received call from patient's wife. She states that he is not eating or drinking since he returned home. He completed antibiotics for a UTI this morning; very little urine output, less than 117ml/day. She would like for patient to be managed in the home, no hospitalization. She is asking for a Hospice referral instead of Palliative in the home. Referral intake notified.

## 2020-05-23 NOTE — Telephone Encounter (Signed)
Spoke with Langley Gauss of [facility] letting her know I was returning a call about verbal orders for palliative care for pt.  Informed her Dr. Darnell Level is giving verbal orders on Dr. Josefine Class behalf since he is out of the office.  She verbalizes understanding, documented and will notify Erline Levine.

## 2020-05-28 NOTE — Telephone Encounter (Addendum)
Agreed.  Let me know if they need a referral placed.  Thanks.

## 2020-05-29 ENCOUNTER — Encounter: Payer: Self-pay | Admitting: Family Medicine

## 2020-05-29 ENCOUNTER — Other Ambulatory Visit: Payer: Self-pay | Admitting: Family Medicine

## 2020-05-29 ENCOUNTER — Other Ambulatory Visit: Payer: Self-pay | Admitting: Neurology

## 2020-05-29 ENCOUNTER — Telehealth: Payer: Self-pay | Admitting: Family Medicine

## 2020-05-29 MED ORDER — SCOPOLAMINE 1 MG/3DAYS TD PT72: 1.0000 | MEDICATED_PATCH | TRANSDERMAL | 1 refills | Status: AC

## 2020-05-29 MED ORDER — CEPHALEXIN 500 MG PO CAPS: 500.0000 mg | ORAL_CAPSULE | Freq: Two times a day (BID) | ORAL | 0 refills | Status: AC

## 2020-05-29 NOTE — Telephone Encounter (Signed)
Wife notified rx was sent and advised on how she can give him his med if he can not swallow the capsule. Wife verbalized understanding.

## 2020-05-29 NOTE — Telephone Encounter (Signed)
Patients wife called in said that he would like to take the antibiotic for the uti. EM

## 2020-05-29 NOTE — Telephone Encounter (Signed)
Please see most recent MyChart message.  Please triage patient re: dysuria/hematuria, mobility.  Thanks.

## 2020-05-29 NOTE — Telephone Encounter (Signed)
  Called wife.  On hospice.  Support offered.  Conversation with wife and hospice SW.  With nausea and vomiting.  Presumed UTI.  Anticoagulation stopped.  Hospice staff and family to present options to patient re: possible abx use.  Reasonable to use scopolamine patch in the meantime with routine cautions.  Family/hospice to update me as needed.  I thank all involved.

## 2020-05-29 NOTE — Telephone Encounter (Addendum)
Would start keflex . Can open the capsules and mix with applesauce if needed.  Please update me as needed.  Thanks.

## 2020-05-29 NOTE — Telephone Encounter (Signed)
Gregory Wiley (DPR signed) said pt is not eating or drinking and kidneys are shutting down. Pt went on hospice care on 05/24/20. I asked if Hospice was addressing the urinary issues and Gregory Wiley said hospice is basically keeping pt comfortable at this time. Sending note to DR Damita Dunnings.

## 2020-05-29 NOTE — Progress Notes (Signed)
Called wife.  On hospice.  Support offered.  Conversation with wife and hospice SW.  With nausea and vomiting.  Presumed UTI.  Anticoagulation stopped.  Hospice staff and family to present options to patient re: possible abx use.  Reasonable to use scopolamine patch in the meantime with routine cautions.  Family/hospice to update me as needed.  I thank all involved.

## 2020-05-29 NOTE — Telephone Encounter (Signed)
From  [place], [place] To  Tonia Ghent, MD Sent  05/22/2020 12:30 PM     [name] is back home from [facility], came home Friday 4th. I believe he is worse now than 3 weeks ago. He refused to get up do to any rehab and now can't stand at all. When he left the hospital I understand he no longer had infection & clean urine. I had to bring it to rehab attention of the blood in his urine, they checked it and put him on Cipro(ciprofloxacino HCl) 250 mg. He takes his last pill tonight. It took 3 of [place] to get him out of his chair and into his bed Sunday when PT came out. PT has now refused to service him until he gets medical attention. I can't get him up & in a wheelchair & get in the car , we can't afford to hire a company to bring him to you & he isn't willing to go to the hospital. Which is where PT thought he should have gone instead of being released home. We had a very bad experience 3 weeks ago from watching a guy die in front of [place] (staff laughing about calling his name 3 hours after he died to put him in a room) to waiting 12 hours till he got a room, to him getting Covid. Now I don't know what to do. Can we do a video call? Can you call in more antibiotics? Did you get release paperwork from [place]? Do you have any thoughts? If you need a urine sample I have cups here and can bring it in. They also took him off his potassium and said he was low but only put him back on 1 pill a day you had him on 2. Do I give him 1 or 2?  Thanks,  [name] PS you can call me at [phone].      Marlou Sa

## 2020-05-29 NOTE — Addendum Note (Signed)
Addended by: Tonia Ghent on: 05/29/2020 05:16 PM   Modules accepted: Orders

## 2020-07-03 ENCOUNTER — Telehealth: Payer: Self-pay | Admitting: Family Medicine

## 2020-07-03 NOTE — Telephone Encounter (Signed)
Called and LMOVM re: condolences.  I was glad to see this patient in clinic.

## 2020-07-05 ENCOUNTER — Telehealth: Payer: Self-pay | Admitting: Family Medicine

## 2020-07-05 NOTE — Telephone Encounter (Signed)
Wife [name] called to thank Dr Damita Dunnings calling and lmovm offering his condolences.Marland KitchenMarland Kitchen

## 2020-07-05 NOTE — Telephone Encounter (Signed)
Patient passed away.[employer] [employer] [facility] called in stating that they are awaiting sign off of the death certificate. Patient is to be cremated and they need it signed off on asap. It is in the [facility] system .

## 2020-07-05 NOTE — Telephone Encounter (Signed)
Called the funeral home and advised them of what the Dr said. EM

## 2020-07-05 NOTE — Telephone Encounter (Signed)
Per report from hospice, Patient passed away on Jul 04, 2020 at 11:36 pm at home. combined systolic/diastolic congestive heart failure.   I did the death certificate, as much as I could.  The state website will not allow me to put the date of completion (07/05/2020).   This isn't something that I can fix.  This is a state website issue that has happened multiple times.  They'll have to check with the state agency about this.  Thanks.

## 2020-07-05 NOTE — Telephone Encounter (Signed)
Please call back funeral and notify them that provider is out of the office but should be able to complete the death certificate by tomorrow morning.

## 2020-07-06 NOTE — Telephone Encounter (Signed)
I spoke with the funeral home to confirm that certificate was completed and they confirmed that it was. Closing note.

## 2020-07-16 DEATH — deceased
